# Patient Record
Sex: Male | Born: 1945 | Race: White | Hispanic: No | Marital: Married | Smoking: Former smoker
Health system: Southern US, Community
[De-identification: ages and names within clinical notes are randomized; demographics above are authoritative.]

## PROBLEM LIST (undated history)

## (undated) ENCOUNTER — Emergency Department (HOSPITAL_COMMUNITY): Admission: EM | Payer: Medicare Other | Source: Home / Self Care

## (undated) DIAGNOSIS — E7800 Pure hypercholesterolemia, unspecified: Secondary | ICD-10-CM

## (undated) DIAGNOSIS — D649 Anemia, unspecified: Secondary | ICD-10-CM

## (undated) DIAGNOSIS — N183 Chronic kidney disease, stage 3 unspecified: Secondary | ICD-10-CM

## (undated) DIAGNOSIS — G629 Polyneuropathy, unspecified: Secondary | ICD-10-CM

## (undated) DIAGNOSIS — J189 Pneumonia, unspecified organism: Secondary | ICD-10-CM

## (undated) DIAGNOSIS — E119 Type 2 diabetes mellitus without complications: Secondary | ICD-10-CM

## (undated) DIAGNOSIS — R000 Tachycardia, unspecified: Secondary | ICD-10-CM

## (undated) DIAGNOSIS — I2510 Atherosclerotic heart disease of native coronary artery without angina pectoris: Secondary | ICD-10-CM

## (undated) DIAGNOSIS — Z9861 Coronary angioplasty status: Secondary | ICD-10-CM

## (undated) DIAGNOSIS — Z951 Presence of aortocoronary bypass graft: Secondary | ICD-10-CM

## (undated) DIAGNOSIS — K219 Gastro-esophageal reflux disease without esophagitis: Secondary | ICD-10-CM

## (undated) DIAGNOSIS — I10 Essential (primary) hypertension: Secondary | ICD-10-CM

## (undated) DIAGNOSIS — K5792 Diverticulitis of intestine, part unspecified, without perforation or abscess without bleeding: Secondary | ICD-10-CM

## (undated) DIAGNOSIS — I739 Peripheral vascular disease, unspecified: Secondary | ICD-10-CM

## (undated) HISTORY — DX: Diverticulitis of intestine, part unspecified, without perforation or abscess without bleeding: K57.92

## (undated) HISTORY — DX: Pneumonia, unspecified organism: J18.9

## (undated) HISTORY — DX: Presence of aortocoronary bypass graft: Z95.1

## (undated) HISTORY — PX: FEMORAL-POPLITEAL BYPASS GRAFT: SHX937

---

## 1996-08-14 DIAGNOSIS — I2510 Atherosclerotic heart disease of native coronary artery without angina pectoris: Secondary | ICD-10-CM

## 1996-08-14 DIAGNOSIS — Z9861 Coronary angioplasty status: Secondary | ICD-10-CM

## 1996-08-14 HISTORY — DX: Atherosclerotic heart disease of native coronary artery without angina pectoris: Z98.61

## 1996-08-14 HISTORY — DX: Atherosclerotic heart disease of native coronary artery without angina pectoris: I25.10

## 1998-01-18 ENCOUNTER — Observation Stay (HOSPITAL_COMMUNITY): Admission: RE | Admit: 1998-01-18 | Discharge: 1998-01-19 | Payer: Self-pay | Admitting: Cardiovascular Disease

## 1999-04-19 ENCOUNTER — Observation Stay (HOSPITAL_COMMUNITY): Admission: RE | Admit: 1999-04-19 | Discharge: 1999-04-20 | Payer: Self-pay | Admitting: Cardiovascular Disease

## 1999-04-19 ENCOUNTER — Encounter: Payer: Self-pay | Admitting: Cardiovascular Disease

## 2001-04-24 ENCOUNTER — Encounter: Payer: Self-pay | Admitting: Internal Medicine

## 2001-04-24 ENCOUNTER — Ambulatory Visit (HOSPITAL_COMMUNITY): Admission: RE | Admit: 2001-04-24 | Discharge: 2001-04-24 | Payer: Self-pay | Admitting: Internal Medicine

## 2001-08-14 HISTORY — PX: CARDIAC CATHETERIZATION: SHX172

## 2001-09-06 ENCOUNTER — Emergency Department (HOSPITAL_COMMUNITY): Admission: EM | Admit: 2001-09-06 | Discharge: 2001-09-06 | Payer: Self-pay | Admitting: Emergency Medicine

## 2001-09-06 ENCOUNTER — Encounter: Payer: Self-pay | Admitting: Emergency Medicine

## 2004-05-05 ENCOUNTER — Ambulatory Visit (HOSPITAL_COMMUNITY): Admission: RE | Admit: 2004-05-05 | Discharge: 2004-05-05 | Payer: Self-pay | Admitting: Cardiovascular Disease

## 2005-02-02 ENCOUNTER — Emergency Department (HOSPITAL_COMMUNITY): Admission: EM | Admit: 2005-02-02 | Discharge: 2005-02-03 | Payer: Self-pay | Admitting: Emergency Medicine

## 2006-02-10 ENCOUNTER — Emergency Department (HOSPITAL_COMMUNITY): Admission: EM | Admit: 2006-02-10 | Discharge: 2006-02-10 | Payer: Self-pay | Admitting: Emergency Medicine

## 2006-02-24 ENCOUNTER — Inpatient Hospital Stay (HOSPITAL_COMMUNITY): Admission: EM | Admit: 2006-02-24 | Discharge: 2006-02-28 | Payer: Self-pay | Admitting: Emergency Medicine

## 2006-03-14 ENCOUNTER — Encounter: Admission: RE | Admit: 2006-03-14 | Discharge: 2006-03-14 | Payer: Self-pay | Admitting: Occupational Medicine

## 2007-05-31 ENCOUNTER — Encounter: Admission: RE | Admit: 2007-05-31 | Discharge: 2007-05-31 | Payer: Self-pay | Admitting: Cardiovascular Disease

## 2007-06-03 ENCOUNTER — Observation Stay (HOSPITAL_COMMUNITY): Admission: RE | Admit: 2007-06-03 | Discharge: 2007-06-04 | Payer: Self-pay | Admitting: Cardiovascular Disease

## 2007-06-05 ENCOUNTER — Ambulatory Visit: Payer: Self-pay | Admitting: *Deleted

## 2007-06-13 ENCOUNTER — Ambulatory Visit: Payer: Self-pay | Admitting: *Deleted

## 2007-06-26 ENCOUNTER — Inpatient Hospital Stay (HOSPITAL_COMMUNITY): Admission: RE | Admit: 2007-06-26 | Discharge: 2007-06-29 | Payer: Self-pay | Admitting: *Deleted

## 2007-06-27 ENCOUNTER — Encounter (INDEPENDENT_AMBULATORY_CARE_PROVIDER_SITE_OTHER): Payer: Self-pay | Admitting: *Deleted

## 2007-06-27 ENCOUNTER — Ambulatory Visit: Payer: Self-pay | Admitting: Vascular Surgery

## 2007-07-18 ENCOUNTER — Ambulatory Visit: Payer: Self-pay | Admitting: *Deleted

## 2010-01-03 ENCOUNTER — Observation Stay (HOSPITAL_COMMUNITY): Admission: EM | Admit: 2010-01-03 | Discharge: 2010-01-04 | Payer: Self-pay | Admitting: Emergency Medicine

## 2010-01-03 ENCOUNTER — Ambulatory Visit: Payer: Self-pay | Admitting: Internal Medicine

## 2010-01-04 ENCOUNTER — Ambulatory Visit: Payer: Self-pay | Admitting: Surgery

## 2010-01-04 ENCOUNTER — Encounter: Payer: Self-pay | Admitting: Internal Medicine

## 2010-09-04 ENCOUNTER — Encounter: Payer: Self-pay | Admitting: Family Medicine

## 2010-10-31 LAB — BASIC METABOLIC PANEL
BUN: 17 mg/dL (ref 6–23)
CO2: 22 mEq/L (ref 19–32)
CO2: 24 mEq/L (ref 19–32)
Calcium: 8.3 mg/dL — ABNORMAL LOW (ref 8.4–10.5)
Creatinine, Ser: 0.98 mg/dL (ref 0.4–1.5)
GFR calc Af Amer: >60 mL/min (ref >60)
GFR calc non Af Amer: >60 mL/min (ref >60)
Potassium: 3.9 mEq/L (ref 3.5–5.1)
Potassium: 4 mEq/L (ref 3.5–5.1)
Sodium: 135 mEq/L (ref 135–145)
Sodium: 135 mEq/L (ref 135–145)

## 2010-10-31 LAB — URINE MICROSCOPIC-ADD ON

## 2010-10-31 LAB — CBC
HCT: 43.5 % (ref 39.0–52.0)
Hemoglobin: 13.6 g/dL (ref 13.0–17.0)
Hemoglobin: 15 g/dL (ref 13.0–17.0)
MCHC: 34.3 g/dL (ref 30.0–36.0)
MCV: 91.6 fL (ref 78.0–100.0)
MCV: 92.5 fL (ref 78.0–100.0)
Platelets: 295 10*3/uL (ref 150–400)
RBC: 4.29 MIL/uL (ref 4.22–5.81)
WBC: 18.9 10*3/uL — ABNORMAL HIGH (ref 4.0–10.5)
WBC: 8.8 10*3/uL (ref 4.0–10.5)

## 2010-10-31 LAB — CK TOTAL AND CKMB (NOT AT ARMC)
CK, MB: 1.2 ng/mL (ref 0.3–4.0)
Relative Index: INVALID (ref 0.0–2.5)
Total CK: 92 U/L (ref 7–232)
Total CK: 99 U/L (ref 7–232)

## 2010-10-31 LAB — HEPATIC FUNCTION PANEL
AST: 18 U/L (ref 0–37)
Albumin: 3.4 g/dL — ABNORMAL LOW (ref 3.5–5.2)
Bilirubin, Direct: 0.1 mg/dL (ref 0.0–0.3)

## 2010-10-31 LAB — GLUCOSE, CAPILLARY
Glucose-Capillary: 151 mg/dL — ABNORMAL HIGH (ref 70–99)
Glucose-Capillary: 172 mg/dL — ABNORMAL HIGH (ref 70–99)
Glucose-Capillary: 226 mg/dL — ABNORMAL HIGH (ref 70–99)

## 2010-10-31 LAB — TSH: TSH: 0.796 u[IU]/mL (ref 0.350–4.500)

## 2010-10-31 LAB — TROPONIN I: Troponin I: 0.01 ng/mL (ref 0.00–0.06)

## 2010-10-31 LAB — LEUKOCYTE ALKALINE PHOSPHATASE: Leukocyte Alkaline  Phos Stain: 178 — ABNORMAL HIGH (ref 22–124)

## 2010-10-31 LAB — DIFFERENTIAL
Eosinophils Relative: 0 % (ref 0–5)
Lymphocytes Relative: 2 % — ABNORMAL LOW (ref 12–46)
Lymphs Abs: 0.3 10*3/uL — ABNORMAL LOW (ref 0.7–4.0)
Monocytes Relative: 7 % (ref 3–12)

## 2010-10-31 LAB — URINALYSIS, ROUTINE W REFLEX MICROSCOPIC
Glucose, UA: >1000 mg/dL — AB
Protein, ur: NEGATIVE mg/dL
Specific Gravity, Urine: 1.02 (ref 1.005–1.030)

## 2010-10-31 LAB — D-DIMER, QUANTITATIVE: D-Dimer, Quant: 1.76 ug/mL-FEU — ABNORMAL HIGH (ref 0.00–0.48)

## 2010-10-31 LAB — LIPID PANEL
Cholesterol: 113 mg/dL (ref 0–200)
HDL: 33 mg/dL — ABNORMAL LOW (ref >39)

## 2010-10-31 LAB — POCT CARDIAC MARKERS: CKMB, poc: 1.1 ng/mL (ref 1.0–8.0)

## 2010-12-27 NOTE — Discharge Summary (Signed)
[name], [name]                  ACCOUNT NO.:  [id number]   MEDICAL RECORD NO.:  [id number]          PATIENT TYPE:  INP   LOCATION:  2006                         FACILITY:  [facility]   PHYSICIAN:  Balinda Quails, M.D.    DATE OF BIRTH:  1946/03/14   DATE OF ADMISSION:  06/26/2007  DATE OF DISCHARGE:                               DISCHARGE SUMMARY   ANTICIPATED DATE OF DISCHARGE:  Going to be June 29, 2007 or  June 30, 2007.   ADMISSION DIAGNOSIS:  Peripheral vascular disease with left lower  extremity claudication.   DISCHARGE/SECONDARY DIAGNOSES:  1. 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  2. History of hyperlipidemia.  3. Diabetes mellitus type 2.  4. Hypertension.  5. Coronary artery disease.  6. History of right superficial femoral artery percutaneous      transluminal angioplasty and stenting.  7. History of left femoral popliteal artery bypass in 1998 using Gore-      Tex.  8. History of tobacco use, quit in 2007.  9. History of pneumonia 2 years ago.   ALLERGIES:  NO KNOWN DRUG ALLERGIES.   PROCEDURE:  Right to left femoral femoral bypass graft, using 7 mm  Dacron graft.   HISTORY:  Mr. Tribby is a 65 year old male with known peripheral vascular  disease, status post left femoral popliteal artery bypass with Gore-Tex  in 1998.  The graft has since failed.  He recently presented with  worsening left lower extremity claudication.  Doppler evaluation  performed in 2006 revealed ABIs of 0.67 on the left and 0.81 on the  right.  He underwent arteriography on June 03, 2007, revealing normal  renal arteries, normal infrarenal abdominal aorta, totally occluded left  common iliac and external iliac arteries with reconstitution of the  common femoral artery and chronic occlusion of the left superficial  femoral artery with collateralization via profunda branches.  His right  lower extremity revealed a 70% proximal right external iliac artery  stenosis, which was successfully angioplastied and stented by Dr. Allyson Sabal  on June 03, 2007.  Dr. Madilyn Fireman recommended that he undergo a right to  left femoral femoral bypass grafting for relief of his claudication.   HOSPITAL COURSE:  Mr. Demore was electively admitted to [facility] on June 26, 2007. He underwent the previously mentioned  procedure.  Postoperatively, he was transferred to step down unit 3300  where he remained for the first 24 hours postop.   Postoperative day 1, he felt appropriate to transfer to the unit 2000  where it is anticipated he will remain until discharge.  Postoperative  ABIs were 0.75 on the right and 0.57 on the left.  He had a monophasic  posterior tibial Doppler signals bilaterally.  He remained  hemodynamically table, afebrile, saturating 95% on room air.  He was on  a NovoLog insulin sliding scale for his diabetes.  Of note, reportedly,  he had discontinued his diabetic medications approximately 2 years ago.  Postoperatively, he was seen  by cardiac rehabilitation to assist with  mobility.  He has been able to walk 550 feet with a steady gait without  safety device.  He has had some incisional pain, but this has overall  been controlled with warm medication.  It is anticipated if he continues  to make steady progress that he will be ready for discharge home on  postoperative day 3, June 29, 2007.  Currently, he is in stable  condition.  He is tolerating a diabetic appropriate diet.  Incisions are  healing well without signs of infection.   DISCHARGE MEDICATIONS:  1. Tylox 1 to 2 tablets p.o. q.6 hours p.r.n. pain  2. Lotrel 5/20 mg p.o. daily.  3. Plavix 75 mg daily.  4. Aspirin 81 mg p.o. daily.  5. Simvastatin 20 mg p.o. daily.   DISCHARGE INSTRUCTIONS:  He is to continue a diabetic appropriate diet  and maintain follow up with his diabetes with his primary  physician.  He  may shower and clean his incision gently with soap and water.  He should  call if he develops fever greater than 101 or redness or drainage from  his incision site or increased pain.  He is to avoid driving or heavy  lifting for the next 2 to 3 weeks.  He will see Dr. Madilyn Fireman in  approximately 3 weeks with postoperative ABIs.  Our office will contact  him regarding specific appointment date and time.      Jerold Coombe, P.A.      Balinda Quails, M.D.  Electronically Signed    AWZ/MEDQ  D:  06/28/2007  T:  06/29/2007  Job:  [id number]   cc:   Nanetta Batty, M.D.  Catalina Pizza, M.D.

## 2010-12-27 NOTE — Assessment & Plan Note (Signed)
OFFICE VISIT   [name], STELLMACH  DOB:  21-Apr-1946                                       07/18/2007  WUXLK#:[id number]   The patient underwent right-to-left femoral-femoral bypass, 06/26/2007,  [facility].  This was carried out for severe left lower  extremity claudication.  He has done well since the surgery.  He still  has some mild soreness in his suprapubic area.  No other complaints.  No  drainage from his incisions.  Claudication symptoms markedly improved.   Ankle-brachial indices 0.76 on the right, 0.57 on the left.   Incisions are healing unremarkably.  Graft is patent.  Pink and well  perfused.  BP 133/76.  Pulse 80 per minute, regular.  Respirations 16  per minute.   The patient is doing well following surgery.  Clear to return to work  August 18, 2006.  Return per protocol for P-scan in 3 months.   Balinda Quails, M.D.  Electronically Signed   PGH/MEDQ  D:  07/18/2007  T:  07/19/2007  Job:  [id number]   cc:   Nanetta Batty, M.D.

## 2010-12-27 NOTE — Assessment & Plan Note (Signed)
OFFICE VISIT   [name], [name]  DOB:  03-09-1946                                       06/13/2007  ZOXWR#:[id number]   CHIEF COMPLAINT:  Left lower extremity claudication.   HISTORY:  The patient is a 65 year old gentleman with known peripheral  vascular disease.  History of Gore-Tex left femoral popliteal bypass  carried out in 1998.  This graft has now failed.   Recently presented with worsening left lower extremity claudication.  He  has a history of right superficial femoral artery PTA and stenting.  Further risk factors including hypertension and hyperlipidemia.   Doppler evaluation performed in 2006 revealed ABIs of 0.67 on the left  and 0.81 on the right.  He has been off tobacco products since 2007.   PAST MEDICAL HISTORY:  1. Type 2 diabetes.  2. Hyperlipidemia.  3. Hypertension.  4. Coronary artery disease.  5. Peripheral vascular disease.   MEDICATIONS:  1. Lotrel 5/20 daily.  2. Plavix 75 mg daily.  3. Aspirin 81 mg daily.  4. Simvastatin 20 mg daily.   FAMILY HISTORY:  Mother is living age 62, she has history of coronary  artery disease with recent open heart bypass surgery.  She has also a  history of diabetes.  His father died in his 32's as a result of trauma.  He has a brother living age 16 with a history of coronary artery disease  also status post open heart bypass surgery.   SOCIAL HISTORY:  The patient is widowed.  He has 2 children.  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  He does not smoke.  He discontinued tobacco  use February 24, 2006.  No alcohol use.   ALLERGIES:  None known.   REVIEW OF SYSTEMS:  Denies weight loss, fever or chills.  Cardiac,  pulmonary, GI and GU symptoms all negative.  Pain in his legs with  walking.  No neurologic, orthopedic, psychiatric, visual or hearing  problems.  Denies bleeding disorder.   PHYSICAL EXAMINATION:  A generally well-appearing 65 year old gentleman.  Alert and oriented.  No acute  distress.  Vital signs:  BP 136/79, pulse  69 per minute, respirations 18 per minute.  HEENT:  Mouth and throat are  clear.  Normocephalic.  Extraocular movements intact.  Neck:  No JVD.  Normal thyroid.  No lymph nodes.  Cardiovascular:  No carotid bruits.  Normal heart sounds without murmurs.  No gallops or rubs.  Regular  rhythm and rate.  Chest:  Equal air entry bilaterally without rales or  rhonchi.  Normal to percussion.  Abdomen:  Soft.  Nontender.  No masses  or organomegaly.  Normal bowel sounds.  Lower extremities:  2+ right  femoral pulse.  Absent left femoral pulse.  No popliteal, posterior  tibia or dorsalis pedis pulses bilaterally.  No ankle edema.  Neurological:  Cranial nerves intact.  Strength equal bilaterally.  Reflexes 1+.  Normal gait.  Sensation intact.   INVESTIGATIONS:  Arteriography performed 06/03/2007 revealed normal  renal arteries, normal infrarenal abdominal aorta.  Totally occluded  left common iliac and external iliac arteries.  Reconstitution common  femoral artery and chronic occlusion left superficial femoral artery  with collateralization via profunda branches.  Right lower extremity  revealed a 70% proximal right external iliac artery stenosis.  This was  successfully angioplastied and stented  by Dr. Allyson Sabal 06/03/2007.   IMPRESSION:  1. Left lower extremity limiting claudication secondary to left iliac      occlusion.  2. Coronary artery disease.  3. Hypertension.  4. Type 2 diabetes.  5. Hyperlipidemia.   RECOMMENDATIONS:  The patient will undergo right to left femoral femoral  bypass for relief of left lower extremity claudication.  Potential risks  to the operative procedure 3-5% of a major complication including MI,  stroke, graft failure, major infection or bleeding.  Diabetes and  coronary artery disease currently well controlled.  Hypertension well  controlled.  The patient will discontinue Plavix 1 week prior to  surgery.  Planned  surgery at [facility].   Balinda Quails, M.D.  Electronically Signed   PGH/MEDQ  D:  06/13/2007  T:  06/14/2007  Job:  [id number]

## 2010-12-27 NOTE — Op Note (Signed)
NAMEALOYSIOUS, VANGIESON                  ACCOUNT NO.:  [id number]   MEDICAL RECORD NO.:  [id number]          PATIENT TYPE:  INP   LOCATION:  2006                         FACILITY:  [facility]   PHYSICIAN:  Balinda Quails, M.D.    DATE OF BIRTH:  1946/04/15   DATE OF PROCEDURE:  06/26/2007  DATE OF DISCHARGE:                               OPERATIVE REPORT   SURGEON:  Denman George, M.D.   ASSISTANT:  Di Kindle. Edilia Bo, M.D., and Jerold Coombe, P.A.   ANESTHETIC:  General endotracheal.   PREOPERATIVE DIAGNOSIS:  Left lower extremity claudication.   PROCEDURE:  Right-to-left femoral-femoral bypass.   CLINICAL NOTE:  Mr. Isidor Bromell is a 65 year old gentleman with history  of tobacco use.  Known peripheral vascular disease.  Previous left  femoral-popliteal bypass which is now chronically occluded.  He  presented with worsening claudication in his left lower extremity.  Underwent arteriography by Dr. Allyson Sabal revealing aortoiliac occlusive  disease.  He has undergone stenting in the right common iliac artery.  Total occlusion of left common external iliac artery is present.  Scheduled at this time for right-to-left femoral-femoral bypass with  left profundoplasty.  Potential risks of the operative procedure  explained to the patient in detail with a major morbidity mortality of 1-  2%.  This includes such complications as MI, stroke, graft infection,  limb ischemia, bleeding or other complications.   OPERATIVE PROCEDURE:  The patient was brought to the operating room in  stable condition.  Placed in a supine position.  General endotracheal  anesthesia induced.  The abdomen of both legs prepped and draped in a  sterile fashion.   Matching longitudinal skin incisions were made at the inguinal ligament  bilaterally.  Dissection carried down through subcutaneous tissue and  lymphatics with electrocautery.   Dissection of the right groin revealed the common femoral artery be  widely  patent.  There was moderate posterior plaque.  Excellent pulse.  Common femoral artery exposed and encircled proximally with a vessel  loop.  Distal dissection carried down to the origin of the superficial  femoral and profunda femoris arteries and the distal common femoral,  right common femoral artery, and encircled with a vessel loop.   The left groin was a redo dissection.  The patient had an occluded left  femoral popliteal Gore-Tex bypass graft.  This arose from left common  femoral artery.  Left common femoral artery was mobilized up to the  inguinal ligament and encircled with vessel loop.  The graft was  dissected free and mobilized.  The origin of the occluded left  superficial femoral artery was also dissected out and encircled with  vessel loop.  The profunda femoris was the only runoff vessel.  This was  a moderate-sized vessel.  Moderate posterior plaque.  The vessel was  dissected out to the first major branchings which were encircled with  vessel loops.   A suprapubic subcutaneous tunnel created between the 2 incisions.  A 7-  mm Dacron graft placed through the tunnel.  The patient administered  5000 units of  heparin intravenously.  The femoral vessels controlled  with clamps on the right.  The right common femoral artery opened  longitudinally.  The right side of the graft beveled and anastomosed end-  to-side to the right common femoral artery using running 7-0 Prolene  suture.  The graft then flushed and then controlled proximally with a  fistula clamp.  The right femoral vessel was then released.   On the left side, the occluded femoral popliteal graft was taken down.  The arteriotomy in the common femoral artery was then extended down into  the left profunda femoris artery.  The left side of the graft was  beveled and anastomosed end-to-side to the left common femoral artery  with the toe of the graft extending out onto the left profunda femoris  artery as a  profundoplasty.  This was an end-to-side anastomosis with a  running 5-0 Prolene suture.  At completion of left femoral anastomosis,  all vessels were flushed.  The graft was flushed.  Clamps were then  removed and excellent flow present.   We irrigated with saline solution.  Adequate hemostasis obtained.  The  patient was administered 50 mg of protamine intravenously.   The groin incisions were then closed with running 2-0 Vicryl suture in a  deep subcutaneous layer.  Running 3-0 Vicryl sutures in superficial  subcutaneous layer.  Four-0 Monocryl in the skin.  Dermabond applied.  No apparent complications.  The patient transferred to the recovery room  in stable condition.      Balinda Quails, M.D.  Electronically Signed     PGH/MEDQ  D:  06/26/2007  T:  06/27/2007  Job:  [id number]   cc:   Nanetta Batty, M.D.  Catalina Pizza, M.D.

## 2010-12-27 NOTE — Cardiovascular Report (Signed)
[name], [name]                  ACCOUNT NO.:  [id number]   MEDICAL RECORD NO.:  [id number]          PATIENT TYPE:  OBV   LOCATION:  2807                         FACILITY:  [facility]   PHYSICIAN:  Nanetta Batty, M.D.   DATE OF BIRTH:  1945-08-29   DATE OF PROCEDURE:  06/03/2007  DATE OF DISCHARGE:                            CARDIAC CATHETERIZATION   Mr. Burch is a 65 year old recently widowed white male, father two  children, with history of CAD and PVD.  He had circumflex stenting in  1998.  He had right superficial femoral artery PTA and stenting as well  as left femoral-popliteal bypass grafting by Dr. Liliane Bade. His other  problems include hypertension and hyperlipidemia.  His last functional  study in 2005 was nonischemic.  Dopplers performed in 2006 revealed a  patent femoral-popliteal bypass graft with ABI of 0.67 on the left and  0.81 on the right.  He denied chest pain, shortness of breath.  He  stopped taking his medicines over a year ago because of financial  considerations.  Unfortunately, he has recently lost his wife this past  summer from myocardial infarction.  He stopped smoking July of last  year.  One week ago he noticed increasing left lower extremity  claudication.  Dopplers performed in the office the same day revealed a  left ABI of 0.46 with an occluded left iliac, left superficial femoral  artery, and femoral-popliteal bypass graft.  He presents now for  angiography and potential intervention.   DESCRIPTION OF PROCEDURE:  The patient was brought to the second floor  [place] PV angiographic suite in postabsorptive state.  Ge was  premedicated with p.o. Valium.  His left groin was prepped and shaved in  usual sterile fashion; 1% Xylocaine was used for local anesthesia.  A 5-  French sheath was inserted into the right femoral artery using standard  Seldinger technique.  A 5-French tennis racket catheter was used for  midstream distal abdominal aortography with  bifemoral runoff.  Visipaque  dye was used for the entirety of the case.  Aortic pressures monitored  in the case.   ANGIOGRAPHIC RESULTS:  1. Abdominal aorta.      a.     Renal arteries:  30% proximal right renal artery stenosis.      b.     Infrarenal abdominal aorta, normal.  2. Left lower extremity:      a.     Totally occluded left common iliac proximally.      b.     Totally occluded left external iliac which reconstitutes at       the common femoral level with a patent profunda femoris.      c.     Occluded left superficial femoral artery which reconstitutes       in the popliteal.      d.     Occluded femoral-popliteal bypass graft.      e.     One-vessel runoff via the posterior tibial.  3. Right lower extremity:      a.     A 70%  proximal right external iliac artery stenosis.  There       was a 50 mm gradient noted on pullback after administration of 200       mcg of intra-arterial nitroglycerin via the side-arm sheath.      b.     Patent proximal and mid right superficial femoral artery       stents.      c.     A 70% distal superficial femoral artery, proximal popliteal,       with one-vessel runoff to posterior tibial.   IMPRESSION:  Mr. Frisbee has an occluded left system with a patent common  femoral profunda as well as one-vessel runoff.  He does have a high-  grade lesion in his right external iliac artery which is hemodynamically  stable.  I believe he will require femoral-femoral crossover grafting.  I do not think he is a candidate for endovascular therapy given the fact  that reconstitution occurs just above the femoral head.   PLAN:  Plan on performing a PTA and stenting of his right external iliac  artery to provide inflow prior to an elective femoral-femoral crossover  graft.   Existing 5-French sheath was exchanged over wire for a 7-French break-  tipped long sheath.  The patient received 3000 units of heparin.  PTA  was performed with a 5.2 Powerflex and  stenting with an 8.3 Nitinol  Smart self-expanding stent. The post dilatation was performed with a 7.2  Powerflex at 4 atmospheres resulting in reduction of 70% stenosis to 0%  residual.  The patient tolerated the procedure well.  The long sheath  was exchanged for a short 7-French sheath.  The patient left the lab in  stable condition.  Sheath will be removed once the ACT falls below 200.  The patient will be gently hydrated, discharged home in the morning.  He  will need an outpatient Persantine Myoview to risk stratify him prior to  his femoral-femoral crossover grafting.  I have contacted Dr. Liliane Bade  to evaluate him while he is in the hospital prior to discharge and  arrange for elective surgery.  The patient left lab in stable condition.      Nanetta Batty, M.D.  Electronically Signed     JB/MEDQ  D:  06/03/2007  T:  06/03/2007  Job:  [id number]   cc:   2nd [place] [facility] Angiographic Suite  [facility]

## 2010-12-27 NOTE — Discharge Summary (Signed)
[name], [name]                  ACCOUNT NO.:  [id number]   MEDICAL RECORD NO.:  [id number]          PATIENT TYPE:  OBV   LOCATION:  4705                         FACILITY:  [facility]   PHYSICIAN:  Nanetta Batty, M.D.   DATE OF BIRTH:  03/26/1946   DATE OF ADMISSION:  06/03/2007  DATE OF DISCHARGE:  06/04/2007                               DISCHARGE SUMMARY   DISCHARGE DIAGNOSES:  1. Peripheral vascular disease, right external iliac artery      percutaneous transluminal angioplasty this admission.  2. Residual total left iliac superficial femoral artery and femoral      popliteal bypass grafting, seen by Dr. Liliane Bade this admission      with plans for readmission in the future for right-to-left femoral-      femoral bypass grafting.   HOSPITAL COURSE:  The patient is a 65 year old male followed by Dr.  Allyson Sabal, Dr. Madilyn Fireman, with coronary disease and peripheral vascular disease.  He has had a previous fem-pop bypass grafting.  He had recently stopped  his medications because of financial problems, and then this past summer  he lost his wife to a myocardial infarction.  He presented May 31, 2007, with increasing claudication.  Dr. Allyson Sabal felt he had recently  occluded his fem-pop bypass graft.  He was admitted June 03, 2007 for  PV intervention.  This was done by Dr. Allyson Sabal.  He had a total left SFA,  total left fem bypass graft and total left common and external iliac  artery with one-vessel runoff.  On the right had a 70% proximal external  iliac artery, patent stents in the mid SFA and a 70% popliteal artery.  He underwent PTA and stenting of the right external iliac artery.  Dr.  Allyson Sabal feels he will need right-to-left bypass grafting.  Dr. Madilyn Fireman was  asked to see the patient in consult.  He will also need a followup  Myoview prior to his surgery.  He was discharged June 04, 2007.   DISCHARGE MEDICATIONS:  1. Lotrel 5/20 once a day.  2. Aspirin 81 mg a day.  3. Plavix 75 mg  a day.   LABORATORY DATA:  Sodium 133, potassium 4.1, BUN 15, creatinine 0.8,  white count 11.4, hemoglobin 15.9, hematocrit 43.9, platelets 295.   DISPOSITION:  The patient is discharged in stable condition.  He will  follow up with Dr. Madilyn Fireman and Dr. Allyson Sabal.      Abelino Derrick, P.A.      Nanetta Batty, M.D.  Electronically Signed    LKK/MEDQ  D:  07/16/2007  T:  07/16/2007  Job:  [id number]

## 2010-12-30 NOTE — Discharge Summary (Signed)
NAMEGARRY, [name]                  ACCOUNT NO.:  [id number]   MEDICAL RECORD NO.:  [id number]          PATIENT TYPE:  INP   LOCATION:  A204                          FACILITY:  [facility]   PHYSICIAN:  Corrie Mckusick, M.D.  DATE OF BIRTH:  February 03, 1946   DATE OF ADMISSION:  02/24/2006  DATE OF DISCHARGE:  07/18/2007LH                                 DISCHARGE SUMMARY   DISCHARGE DIAGNOSIS:  Pneumonia.   HISTORY OF PRESENT ILLNESS/PAST MEDICAL HISTORY:  Please see admission H &  P.   HOSPITAL COURSE:  A 65 year old gentleman with diabetes, coronary artery  disease, peripheral vascular disease, and bilateral pneumonias.  He was  admitted for intravenous antibiotics and moderate sliding scale insulin.  We  also worked on better blood sugar control when he was here.  The day after  admission, he felt remarkably better.  White count had come down to 16.  Antibiotics and nebulizer treatments were continued.  CT of the chest was  done two days after admission and showed a questionable reactivity and  needed repeated in four weeks, and this will be set up prior to his  discharge.  The patient continued to do well and was ready for discharge on  February 28, 2006.  He was to follow up with Dr. Elfredia Nevins in one week on  March 07, 2006, at 11:45, and a CT of the chest will be done April 04, 2006,  at 9 a.m.   DISCHARGE MEDICATIONS:  Discharge medications are the same as admission in  addition to Xopenex MDI four times a day, Spiriva daily, and Avalox daily  for 7 additional days.   DISCHARGE CONDITION:  Improved and stable.   DISCHARGE RECOMMENDATIONS:  Discharge recommendations were to strongly  discontinue smoking.      Corrie Mckusick, M.D.  Electronically Signed     JCG/MEDQ  D:  03/12/2006  T:  03/12/2006  Job:  [id number]

## 2010-12-30 NOTE — H&P (Signed)
NAMEBECKY, BERBERIAN                  ACCOUNT NO.:  [id number]   MEDICAL RECORD NO.:  [id number]          PATIENT TYPE:  INP   LOCATION:  A204                          FACILITY:  [facility]   PHYSICIAN:  Corrie Mckusick, M.D.  DATE OF BIRTH:  1945/11/04   DATE OF ADMISSION:  02/24/2006  DATE OF DISCHARGE:  LH                                HISTORY & PHYSICAL   ADMISSION DIAGNOSIS:  Pneumonia.   ADDITIONAL DIAGNOSIS:  Diabetes.   HISTORY OF PRESENT ILLNESS:  This is a 65 year old gentleman with a history  of diabetes and coronary artery disease who presents with bilateral  pneumonias.  He had an injury at work a few weeks ago with rib fractures and  feels like he subsequently developed this pneumonia from those fractures.  He came into the emergency department at the end of June and that is when  the rib fractures were found.  He now presents with four to five days of  worsening cough, fever, and congestion.  He also has some shortness of  breath.  He has not been in the office for his diabetes or had followup in  nearly two years.  He reports no chest pain, shortness of breath, or other  complaints.  He also is followed by [facility].   PAST MEDICAL HISTORY:  1.  Diabetes.  2.  Coronary artery disease, status post stents and fem/pop bypass of the      left leg.  3.  Peripheral vascular disease.   PAST SURGICAL HISTORY:  As above.   SOCIAL HISTORY:  He continues to smoke.  No alcohol use.  No illicit drugs.   FAMILY HISTORY:  Heart disease in his family and diabetes.  Lives with his  wife.   ADMISSION MEDICATIONS:  1.  Glucotrol 5 mg b.i.d.  2.  Aspirin 81 mg daily.   ALLERGIES:  No known drug allergies.   PHYSICAL EXAMINATION:  VITAL SIGNS:  Temperature 98.4, blood pressure  108/59, heart rate 62, respiratory rate 18, O2 saturation 98% on room air.  GENERAL:  When I saw the patient, he was pleasant, talkative, in no acute  distress.  HEENT:  Nasopharynx clear with  moist mucous membranes.  NECK:  Supple.  CHEST:  There is diffuse in-expiratory wheezes with some mild bilateral  upper rhonchi.  No areas of consolidation appreciated.  CARDIOVASCULAR:  Regular rate and rhythm.  Normal S1 and S2.  ABDOMEN:  Soft, nontender.  EXTREMITIES:  No cyanosis, clubbing, or edema.   LABORATORY DATA:  White count 16, hemoglobin of 15.5, hematocrit of 44.7,  platelets of 264, left shift.  Sodium 131, potassium 3.5, chloride 97,  bicarbonate 26, glucose 179, BUN 17, creatinine 0.9.  Chest x-ray showed  right middle, right upper, and lingular infiltrates.   ASSESSMENT AND PLAN:  A 65 year old gentleman with diabetes, coronary artery  disease, peripheral vascular disease, with bilateral pneumonias.   PLAN:  1.  Admit for aggressive antibiotics, including double coverage with      Rocephin 1 g IV q.24h., and azithromycin 500 mg IV q.24h.  2.  Moderate sliding scale insulin.  3.  Will need better diabetes control and followup.  4.  Add on hemoglobin A1C.  5.  Xopenex plus Atrovent nebulizers q.i.d.  6.  Work on smoking cessation.   We will continue to follow closely.      Corrie Mckusick, M.D.  Electronically Signed     JCG/MEDQ  D:  02/25/2006  T:  02/25/2006  Job:  [id number]

## 2011-05-23 LAB — COMPREHENSIVE METABOLIC PANEL
AST: 21
Albumin: 3.7
BUN: 14
Calcium: 9.3
Chloride: 98
Creatinine, Ser: 1.11
GFR calc Af Amer: >60
Total Bilirubin: 0.6

## 2011-05-23 LAB — BASIC METABOLIC PANEL
CO2: 26
Chloride: 100
GFR calc Af Amer: >60
Glucose, Bld: 227 — ABNORMAL HIGH
Sodium: 133 — ABNORMAL LOW

## 2011-05-23 LAB — CBC
HCT: 39.9
HCT: 44.8
Hemoglobin: 13.5
MCHC: 33.9
MCHC: 34.2
MCV: 90.2
MCV: 90.9
Platelets: 297
RBC: 4.39
RDW: 13.4
WBC: 8.9

## 2011-05-23 LAB — PROTIME-INR: INR: 1

## 2011-05-23 LAB — URINALYSIS, ROUTINE W REFLEX MICROSCOPIC
Bilirubin Urine: NEGATIVE
Ketones, ur: NEGATIVE
Leukocytes, UA: NEGATIVE
Nitrite: NEGATIVE
Specific Gravity, Urine: 1.03
Urobilinogen, UA: 1

## 2011-05-23 LAB — APTT: aPTT: 26

## 2011-05-23 LAB — TYPE AND SCREEN: ABO/RH(D): A POS

## 2011-05-24 LAB — BASIC METABOLIC PANEL
BUN: 15
CO2: 27
Chloride: 100
Glucose, Bld: 143 — ABNORMAL HIGH
Potassium: 4.1

## 2011-05-24 LAB — CBC
Hemoglobin: 14.9
MCHC: 34.1
RDW: 13.7

## 2013-04-04 ENCOUNTER — Other Ambulatory Visit: Payer: Self-pay | Admitting: *Deleted

## 2013-04-04 DIAGNOSIS — I2510 Atherosclerotic heart disease of native coronary artery without angina pectoris: Secondary | ICD-10-CM

## 2013-04-04 DIAGNOSIS — I10 Essential (primary) hypertension: Secondary | ICD-10-CM

## 2013-04-17 ENCOUNTER — Telehealth (HOSPITAL_COMMUNITY): Payer: Self-pay | Admitting: Cardiovascular Disease

## 2013-04-24 ENCOUNTER — Telehealth (HOSPITAL_COMMUNITY): Payer: Self-pay | Admitting: *Deleted

## 2013-08-18 ENCOUNTER — Telehealth (HOSPITAL_COMMUNITY): Payer: Self-pay | Admitting: *Deleted

## 2013-09-03 ENCOUNTER — Telehealth: Payer: Self-pay

## 2013-09-03 ENCOUNTER — Other Ambulatory Visit: Payer: Self-pay

## 2013-09-03 DIAGNOSIS — Z1211 Encounter for screening for malignant neoplasm of colon: Secondary | ICD-10-CM

## 2013-09-03 NOTE — Telephone Encounter (Signed)
Pt was referred from [facility] in [place]. I called and lmom for a return call.

## 2013-09-03 NOTE — Telephone Encounter (Signed)
Gastroenterology Pre-Procedure Review  Request Date: 09/03/2013 Requesting Physician: [facility] in [place]   Referred pt for his first ever screening colonoscopy  PATIENT REVIEW QUESTIONS: The patient responded to the following health history questions as indicated:    1. Diabetes Melitis: YES 2. Joint replacements in the past 12 months: no 3. Major health problems in the past 3 months: no 4. Has an artificial valve or MVP: no 5. Has a defibrillator: no 6. Has been advised in past to take antibiotics in advance of a procedure like teeth cleaning: no    MEDICATIONS & ALLERGIES:    Patient reports the following regarding taking any blood thinners:   Plavix? YES Aspirin? YES Coumadin? no  Patient confirms/reports the following medications:  Current Outpatient Prescriptions  Medication Sig Dispense Refill  . amLODipine (NORVASC) 10 MG tablet Take 10 mg by mouth daily.      [name] aspirin 81 MG tablet Take 81 mg by mouth daily.      . cetirizine (ZYRTEC) 10 MG tablet Take 10 mg by mouth daily.      . clopidogrel (PLAVIX) 75 MG tablet Take 75 mg by mouth daily with breakfast.      . gabapentin (NEURONTIN) 300 MG capsule Take 300 mg by mouth daily.      [name] glipiZIDE (GLUCOTROL) 5 MG tablet Take 5 mg by mouth 2 (two) times daily before a meal. Take two tablets twice daily      . hydrochlorothiazide (HYDRODIURIL) 25 MG tablet Take 25 mg by mouth daily.      [name] lisinopril (PRINIVIL,ZESTRIL) 40 MG tablet Take 40 mg by mouth daily.      . metFORMIN (GLUMETZA) 500 MG (MOD) 24 hr tablet Take 500 mg by mouth 2 (two) times daily with a meal. PT takes two tablets twice daily      . pantoprazole (PROTONIX) 40 MG tablet Take 40 mg by mouth daily.      . simvastatin (ZOCOR) 40 MG tablet Take 40 mg by mouth daily. PT takes 1/2 tablet at bedtime daily       No current facility-administered medications for this visit.    Patient confirms/reports the following allergies:  Not on File  No orders of the  defined types were placed in this encounter.    AUTHORIZATION INFORMATION Primary Insurance:  ID #:   Group #:  Pre-Cert / Auth required: Pre-Cert / Auth #:  Secondary Insurance:   ID #:   Group #:  Pre-Cert / Auth required: Pre-Cert / Auth #:   SCHEDULE INFORMATION: Procedure has been scheduled as follows:  Date: 09/22/2013                   Time:  9:30 AM Location: [facility] Short Stay  This Gastroenterology Pre-Precedure Review Form is being routed to the following provider(s): R. Garfield Cornea, MD

## 2013-09-03 NOTE — Telephone Encounter (Signed)
Appropriate for colonoscopy. No diabetes medication the day of the procedure.

## 2013-09-04 MED ORDER — PEG-KCL-NACL-NASULF-NA ASC-C 100 G PO SOLR: 1.0000 | ORAL | Status: DC

## 2013-09-04 NOTE — Telephone Encounter (Signed)
Rx faxed to [facility] at [phone] and instructions mailed to pt.

## 2013-09-09 ENCOUNTER — Encounter (HOSPITAL_COMMUNITY): Payer: Self-pay | Admitting: Pharmacy Technician

## 2013-09-15 ENCOUNTER — Telehealth: Payer: Self-pay | Admitting: Internal Medicine

## 2013-09-15 NOTE — Telephone Encounter (Signed)
Someone called from the [facility] regarding a prescription on patient. He said this was someone that they have never seen. I sent this to [facility], but it looks like it was for a prep where you had scheduled a colonoscopy. Please advise where this prescription needs to go. The pharmacy that called their number is [phone]

## 2013-09-15 NOTE — Telephone Encounter (Signed)
Someone called from the [facility] to let us know that a prescription had been sent to them and they do not have this patient listed as someone that they see. Please advise. I told him that [facility]'s nurse was not here today and I would get her to follow up on where the prescription needed to be. His number is [phone]

## 2013-09-15 NOTE — Telephone Encounter (Signed)
I returned the call. Was told this was a Customer service manager for the [facility]. I told them I thought the problem was that pt was referred as [name] and we have him as [name].  I was told that the pt would need to get in touch with his PCP at the [facility].  I called pt and he said he had talked to them last Friday and was assured that the prep would be here in time to do for his colonoscopy on 09/22/2013.  He will call me by Thursday afternoon if he has not received it, so I can send something in locally.

## 2013-09-16 NOTE — Telephone Encounter (Signed)
DS took care of this, see separate phone note.

## 2013-09-18 MED ORDER — PEG 3350-KCL-NA BICARB-NACL 420 G PO SOLR: 4000.0000 mL | ORAL | Status: DC

## 2013-09-18 NOTE — Telephone Encounter (Signed)
Pt called and said he has not received a prep from the [facility] and asked me to call him in less expensive one to [place]. I am faxing the instructions to [facility] also.

## 2013-09-18 NOTE — Addendum Note (Signed)
Addended by: Everardo All on: 09/18/2013 02:02 PM   Modules accepted: Orders

## 2013-09-22 ENCOUNTER — Encounter (HOSPITAL_COMMUNITY): Payer: Self-pay | Admitting: *Deleted

## 2013-09-22 ENCOUNTER — Ambulatory Visit (HOSPITAL_COMMUNITY)
Admission: RE | Admit: 2013-09-22 | Discharge: 2013-09-22 | Disposition: A | Discharge status: Home / Self Care | Payer: Non-veteran care | Source: Ambulatory Visit | Attending: Internal Medicine | Admitting: Internal Medicine

## 2013-09-22 ENCOUNTER — Encounter (HOSPITAL_COMMUNITY)
Admission: RE | Disposition: A | Discharge status: Home / Self Care | Payer: Self-pay | Source: Ambulatory Visit | Attending: Internal Medicine

## 2013-09-22 DIAGNOSIS — Z1211 Encounter for screening for malignant neoplasm of colon: Secondary | ICD-10-CM

## 2013-09-22 DIAGNOSIS — E119 Type 2 diabetes mellitus without complications: Secondary | ICD-10-CM | POA: Insufficient documentation

## 2013-09-22 DIAGNOSIS — K5730 Diverticulosis of large intestine without perforation or abscess without bleeding: Secondary | ICD-10-CM | POA: Insufficient documentation

## 2013-09-22 DIAGNOSIS — I10 Essential (primary) hypertension: Secondary | ICD-10-CM | POA: Insufficient documentation

## 2013-09-22 DIAGNOSIS — Z7982 Long term (current) use of aspirin: Secondary | ICD-10-CM | POA: Insufficient documentation

## 2013-09-22 DIAGNOSIS — Z79899 Other long term (current) drug therapy: Secondary | ICD-10-CM | POA: Insufficient documentation

## 2013-09-22 HISTORY — DX: Type 2 diabetes mellitus without complications: E11.9

## 2013-09-22 HISTORY — DX: Polyneuropathy, unspecified: G62.9

## 2013-09-22 HISTORY — DX: Pure hypercholesterolemia, unspecified: E78.00

## 2013-09-22 HISTORY — DX: Gastro-esophageal reflux disease without esophagitis: K21.9

## 2013-09-22 HISTORY — DX: Essential (primary) hypertension: I10

## 2013-09-22 HISTORY — DX: Peripheral vascular disease, unspecified: I73.9

## 2013-09-22 HISTORY — PX: COLONOSCOPY: SHX5424

## 2013-09-22 LAB — GLUCOSE, CAPILLARY: GLUCOSE-CAPILLARY: 109 mg/dL — AB (ref 70–99)

## 2013-09-22 SURGERY — COLONOSCOPY
Anesthesia: Moderate Sedation

## 2013-09-22 MED ORDER — MIDAZOLAM HCL 5 MG/5ML IJ SOLN
INTRAMUSCULAR | Status: DC | PRN
  Administered 2013-09-22: 2 mg via INTRAVENOUS
  Administered 2013-09-22 (×2): 1 mg via INTRAVENOUS
  Administered 2013-09-22: 2 mg via INTRAVENOUS

## 2013-09-22 MED ORDER — ONDANSETRON HCL 4 MG/2ML IJ SOLN
INTRAMUSCULAR | Status: DC | PRN
  Administered 2013-09-22: 4 mg via INTRAVENOUS

## 2013-09-22 MED ORDER — SODIUM CHLORIDE 0.9 % IV SOLN
INTRAVENOUS | Status: DC
Start: 2013-09-22 — End: 2013-09-22
  Administered 2013-09-22: 09:00:00 via INTRAVENOUS

## 2013-09-22 MED ORDER — STERILE WATER FOR IRRIGATION IR SOLN
Status: DC | PRN
  Administered 2013-09-22: 09:00:00

## 2013-09-22 MED ORDER — MIDAZOLAM HCL 5 MG/5ML IJ SOLN
INTRAMUSCULAR | Status: AC
  Filled 2013-09-22: qty 10

## 2013-09-22 MED ORDER — ONDANSETRON HCL 4 MG/2ML IJ SOLN
INTRAMUSCULAR | Status: DC
Start: 2013-09-22 — End: 2013-09-22
  Filled 2013-09-22: qty 2

## 2013-09-22 MED ORDER — MEPERIDINE HCL 100 MG/ML IJ SOLN
INTRAMUSCULAR | Status: DC | PRN
  Administered 2013-09-22: 50 mg via INTRAVENOUS
  Administered 2013-09-22 (×2): 25 mg via INTRAVENOUS

## 2013-09-22 MED ORDER — MEPERIDINE HCL 100 MG/ML IJ SOLN
INTRAMUSCULAR | Status: AC
  Filled 2013-09-22: qty 2

## 2013-09-22 NOTE — H&P (Signed)
Primary Care Physician:  No primary provider on file. Primary Gastroenterologist:  Dr. Gala Romney  Pre-Procedure History & Physical: HPI:  [name] is a 68 y.o. male is here for a screening colonoscopy. No bowel symptoms. No prior history of colonoscopy. No family history of polyps or colon cancer  Past Medical History  Diagnosis Date  . Hypertension   . Hypercholesteremia   . GERD (gastroesophageal reflux disease)   . Diabetes mellitus without complication   . Neuropathy   . Peripheral vascular disease     Past Surgical History  Procedure Laterality Date  . Cardiac catheterization  2003    with stent  . Femoral-popliteal bypass graft      Prior to Admission medications   Medication Sig Start Date End Date Taking? Authorizing Provider  amLODipine (NORVASC) 10 MG tablet Take 10 mg by mouth daily.   Yes Historical Provider, MD  aspirin 81 MG tablet Take 81 mg by mouth daily.   Yes Historical Provider, MD  cetirizine (ZYRTEC) 10 MG tablet Take 10 mg by mouth daily.   Yes Historical Provider, MD  Cholecalciferol (VITAMIN D) 2000 UNITS tablet Take 2,000 Units by mouth daily.   Yes Historical Provider, MD  clopidogrel (PLAVIX) 75 MG tablet Take 75 mg by mouth daily with breakfast.   Yes Historical Provider, MD  gabapentin (NEURONTIN) 300 MG capsule Take 300 mg by mouth daily.   Yes Historical Provider, MD  glipiZIDE (GLUCOTROL) 5 MG tablet Take 10 mg by mouth 2 (two) times daily before a meal.    Yes Historical Provider, MD  hydrochlorothiazide (HYDRODIURIL) 25 MG tablet Take 25 mg by mouth daily.   Yes Historical Provider, MD  lisinopril (PRINIVIL,ZESTRIL) 40 MG tablet Take 40 mg by mouth daily.   Yes Historical Provider, MD  metFORMIN (GLUMETZA) 500 MG (MOD) 24 hr tablet Take 1,000 mg by mouth 2 (two) times daily with a meal.    Yes Historical Provider, MD  pantoprazole (PROTONIX) 40 MG tablet Take 40 mg by mouth daily.   Yes Historical Provider, MD  polyethylene glycol-electrolytes  (TRILYTE) 420 G solution Take 4,000 mLs by mouth as directed. 09/18/13  Yes Daneil Dolin, MD  simvastatin (ZOCOR) 40 MG tablet Take 20 mg by mouth at bedtime.    Yes Historical Provider, MD  peg 3350 powder (MOVIPREP) 100 G SOLR Take 1 kit (200 g total) by mouth as directed. 09/04/13   Daneil Dolin, MD    Allergies as of 09/03/2013  . (Not on File)    Family History  Problem Relation Age of Onset  . Colon cancer Neg Hx     History   Social History  . Marital Status: Married    Spouse Name: N/A    Number of Children: N/A  . Years of Education: N/A   Occupational History  . Not on file.   Social History Main Topics  . Smoking status: Former Smoker -- 2.00 packs/day for 30 years    Types: Cigarettes  . Smokeless tobacco: Not on file  . Alcohol Use: No  . Drug Use: No  . Sexual Activity: Not on file   Other Topics Concern  . Not on file   Social History Narrative  . No narrative on file    Review of Systems: See HPI, otherwise negative ROS  Physical Exam: BP 112/54  Pulse 93  Temp(Src) 97.5 F (36.4 C) (Oral)  Resp 24  Ht '5\' 7"'  (1.702 m)  Wt 155 lb (70.308 kg)  BMI 24.27 kg/m2  SpO2 98% General:   Alert,  Well-developed, well-nourished, pleasant and cooperative in NAD Head:  Normocephalic and atraumatic. Eyes:  Sclera clear, no icterus.   Conjunctiva pink. Ears:  Normal auditory acuity. Nose:  No deformity, discharge,  or lesions. Mouth:  No deformity or lesions, dentition normal. Neck:  Supple; no masses or thyromegaly. Lungs:  Clear throughout to auscultation.   No wheezes, crackles, or rhonchi. No acute distress. Heart:  Regular rate and rhythm; no murmurs, clicks, rubs,  or gallops. Abdomen:  Soft, nontender and nondistended. No masses, hepatosplenomegaly or hernias noted. Normal bowel sounds, without guarding, and without rebound.   Msk:  Symmetrical without gross deformities. Normal posture. Pulses:  Normal pulses noted. Extremities:  Without  clubbing or edema. Neurologic:  Alert and  oriented x4;  grossly normal neurologically. Skin:  Intact without significant lesions or rashes. Cervical Nodes:  No significant cervical adenopathy. Psych:  Alert and cooperative. Normal mood and affect.  Impression/Plan: [name] is now here to undergo a screening colonoscopy.  First-ever average risk screening examination.  Risks, benefits, limitations, imponderables and alternatives regarding colonoscopy have been reviewed with the patient. Questions have been answered. All parties agreeable.

## 2013-09-22 NOTE — Op Note (Signed)
[facility] [address] [place], [address]   COLONOSCOPY PROCEDURE REPORT  PATIENT: Anthony, Dunn  MR#:         [id number] BIRTHDATE: 12-04-1945 , 68  yrs. old GENDER: Male ENDOSCOPIST: R.  Garfield Cornea, MD FACP [facility] REFERRED BY:       [facility] PROCEDURE DATE:  09/22/2013 PROCEDURE:     Screening colonoscopy  INDICATIONS:  First ever average risk colorectal cancer screening examination  INFORMED CONSENT:  The risks, benefits, alternatives and imponderables including but not limited to bleeding, perforation as well as the possibility of a missed lesion have been reviewed.  The potential for biopsy, lesion removal, etc. have also been discussed.  Questions have been answered.  All parties agreeable. Please see the history and physical in the medical record for more information.  MEDICATIONS: Versed 6 mg IV and Demerol 100 mg IV in divided doses. Zofran 4 mg IV  DESCRIPTION OF PROCEDURE:  After a digital rectal exam was performed, the EC-3890Li ([id number])  colonoscope was advanced from the anus through the rectum and colon to the area of the cecum, ileocecal valve and appendiceal orifice.  The cecum was deeply intubated.  These structures were well-seen and photographed for the record.  From the level of the cecum and ileocecal valve, the scope was slowly and cautiously withdrawn.  The mucosal surfaces were carefully surveyed utilizing scope tip deflection to facilitate fold flattening as needed.  The scope was pulled down into the rectum where a thorough examination including retroflexion was performed.    FINDINGS:  Marginal prep-became adequate eventually as residual liquid stool lavaged and copiously suctioned out the colonic effluent. Normal rectum. Extensive left-sided diverticula; however, the remainder of the colonic mucosa appeared normal.  THERAPEUTIC / DIAGNOSTIC MANEUVERS PERFORMED: none  COMPLICATIONS: None  CECAL WITHDRAWAL TIME:  14  minutes  IMPRESSION:  Colonic diverticulosis  RECOMMENDATIONS: One more screening colonoscopy in 10 years   _______________________________ eSigned:  R. Garfield Cornea, MD FACP [facility] 09/22/2013 10:18 AM   CC:    PATIENT NAME:  Anthony, Dunn MR#: [id number]

## 2013-09-22 NOTE — Discharge Instructions (Addendum)
Colonoscopy Discharge Instructions  Read the instructions outlined below and refer to this sheet in the next few weeks. These discharge instructions provide you with general information on caring for yourself after you leave the hospital. Your doctor may also give you specific instructions. While your treatment has been planned according to the most current medical practices available, unavoidable complications occasionally occur. If you have any problems or questions after discharge, call Dr. Gala Romney at [phone]. ACTIVITY  You may resume your regular activity, but move at a slower pace for the next 24 hours.   Take frequent rest periods for the next 24 hours.   Walking will help get rid of the air and reduce the bloated feeling in your belly (abdomen).   No driving for 24 hours (because of the medicine (anesthesia) used during the test).    Do not sign any important legal documents or operate any machinery for 24 hours (because of the anesthesia used during the test).  NUTRITION  Drink plenty of fluids.   You may resume your normal diet as instructed by your doctor.   Begin with a light meal and progress to your normal diet. Heavy or fried foods are harder to digest and may make you feel sick to your stomach (nauseated).   Avoid alcoholic beverages for 24 hours or as instructed.  MEDICATIONS  You may resume your normal medications unless your doctor tells you otherwise.  WHAT YOU CAN EXPECT TODAY  Some feelings of bloating in the abdomen.   Passage of more gas than usual.   Spotting of blood in your stool or on the toilet paper.  IF YOU HAD POLYPS REMOVED DURING THE COLONOSCOPY:  No aspirin products for 7 days or as instructed.   No alcohol for 7 days or as instructed.   Eat a soft diet for the next 24 hours.  FINDING OUT THE RESULTS OF YOUR TEST Not all test results are available during your visit. If your test results are not back during the visit, make an appointment  with your caregiver to find out the results. Do not assume everything is normal if you have not heard from your caregiver or the medical facility. It is important for you to follow up on all of your test results.  SEEK IMMEDIATE MEDICAL ATTENTION IF:  You have more than a spotting of blood in your stool.   Your belly is swollen (abdominal distention).   You are nauseated or vomiting.   You have a temperature over 101.   You have abdominal pain or discomfort that is severe or gets worse throughout the day.   diverticulosis information provided  Recommend one more screening colonoscopy in 10 years  Diverticulosis Diverticulosis is a common condition that develops when small pouches (diverticula) form in the wall of the colon. The risk of diverticulosis increases with age. It happens more often in people who eat a low-fiber diet. Most individuals with diverticulosis have no symptoms. Those individuals with symptoms usually experience abdominal pain, constipation, or loose stools (diarrhea). HOME CARE INSTRUCTIONS   Increase the amount of fiber in your diet as directed by your caregiver or dietician. This may reduce symptoms of diverticulosis.  Your caregiver may recommend taking a dietary fiber supplement.  Drink at least 6 to 8 glasses of water each day to prevent constipation.  Try not to strain when you have a bowel movement.  Your caregiver may recommend avoiding nuts and seeds to prevent complications, although this is still an uncertain benefit.  Only take over-the-counter or prescription medicines for pain, discomfort, or fever as directed by your caregiver. FOODS WITH HIGH FIBER CONTENT INCLUDE:  Fruits. Apple, peach, pear, tangerine, raisins, prunes.  Vegetables. Brussels sprouts, asparagus, broccoli, cabbage, carrot, cauliflower, romaine lettuce, spinach, summer squash, tomato, winter squash, zucchini.  Starchy Vegetables. Baked beans, kidney beans, lima beans, split  peas, lentils, potatoes (with skin).  Grains. Whole wheat bread, brown rice, bran flake cereal, plain oatmeal, white rice, shredded wheat, bran muffins. SEEK IMMEDIATE MEDICAL CARE IF:   You develop increasing pain or severe bloating.  You have an oral temperature above 102 F (38.9 C), not controlled by medicine.  You develop vomiting or bowel movements that are bloody or black. Document Released: 04/27/2004 Document Revised: 10/23/2011 Document Reviewed: 12/29/2009 [facility] Patient Information 2014 [place]. High-Fiber Diet Fiber is found in fruits, vegetables, and grains. A high-fiber diet encourages the addition of more whole grains, legumes, fruits, and vegetables in your diet. The recommended amount of fiber for adult males is 38 g per day. For adult females, it is 25 g per day. Pregnant and lactating women should get 28 g of fiber per day. If you have a digestive or bowel problem, ask your caregiver for advice before adding high-fiber foods to your diet. Eat a variety of high-fiber foods instead of only a select few type of foods.  PURPOSE  To increase stool bulk.  To make bowel movements more regular to prevent constipation.  To lower cholesterol.  To prevent overeating. WHEN IS THIS DIET USED?  It may be used if you have constipation and hemorrhoids.  It may be used if you have uncomplicated diverticulosis (intestine condition) and irritable bowel syndrome.  It may be used if you need help with weight management.  It may be used if you want to add it to your diet as a protective measure against atherosclerosis, diabetes, and cancer. SOURCES OF FIBER  Whole-grain breads and cereals.  Fruits, such as apples, oranges, bananas, berries, prunes, and pears.  Vegetables, such as green peas, carrots, sweet potatoes, beets, broccoli, cabbage, spinach, and artichokes.  Legumes, such split peas, soy, lentils.  Almonds. FIBER CONTENT IN FOODS Starches and Grains /  Dietary Fiber (g)  Cheerios, 1 cup / 3 g  Corn Flakes cereal, 1 cup / 0.7 g  Rice crispy treat cereal, 1 cup / 0.3 g  Instant oatmeal (cooked),  cup / 2 g  Frosted wheat cereal, 1 cup / 5.1 g  Brown, long-grain rice (cooked), 1 cup / 3.5 g  White, long-grain rice (cooked), 1 cup / 0.6 g  Enriched macaroni (cooked), 1 cup / 2.5 g Legumes / Dietary Fiber (g)  Baked beans (canned, plain, or vegetarian),  cup / 5.2 g  Kidney beans (canned),  cup / 6.8 g  Pinto beans (cooked),  cup / 5.5 g Breads and Crackers / Dietary Fiber (g)  Plain or honey graham crackers, 2 squares / 0.7 g  Saltine crackers, 3 squares / 0.3 g  Plain, salted pretzels, 10 pieces / 1.8 g  Whole-wheat bread, 1 slice / 1.9 g  White bread, 1 slice / 0.7 g  Raisin bread, 1 slice / 1.2 g  Plain bagel, 3 oz / 2 g  Flour tortilla, 1 oz / 0.9 g  Corn tortilla, 1 small / 1.5 g  Hamburger or hotdog bun, 1 small / 0.9 g Fruits / Dietary Fiber (g)  Apple with skin, 1 medium / 4.4 g  Sweetened applesauce,  cup / 1.5 g  Banana,  medium / 1.5 g  Grapes, 10 grapes / 0.4 g  Orange, 1 small / 2.3 g  Raisin, 1.5 oz / 1.6 g  Melon, 1 cup / 1.4 g Vegetables / Dietary Fiber (g)  Green beans (canned),  cup / 1.3 g  Carrots (cooked),  cup / 2.3 g  Broccoli (cooked),  cup / 2.8 g  Peas (cooked),  cup / 4.4 g  Mashed potatoes,  cup / 1.6 g  Lettuce, 1 cup / 0.5 g  Corn (canned),  cup / 1.6 g  Tomato,  cup / 1.1 g Document Released: 07/31/2005 Document Revised: 01/30/2012 Document Reviewed: 11/02/2011 [facility] Patient Information 2014 [place], [place].

## 2013-09-25 ENCOUNTER — Encounter (HOSPITAL_COMMUNITY): Payer: Self-pay | Admitting: Internal Medicine

## 2015-03-15 ENCOUNTER — Encounter (HOSPITAL_COMMUNITY): Payer: Self-pay | Admitting: Emergency Medicine

## 2015-03-15 ENCOUNTER — Emergency Department (HOSPITAL_COMMUNITY)
Admission: EM | Admit: 2015-03-15 | Discharge: 2015-03-16 | Disposition: A | Discharge status: Home / Self Care | Payer: Medicare Other | Attending: Emergency Medicine | Admitting: Emergency Medicine

## 2015-03-15 ENCOUNTER — Emergency Department (HOSPITAL_COMMUNITY): Payer: Medicare Other

## 2015-03-15 DIAGNOSIS — Z7982 Long term (current) use of aspirin: Secondary | ICD-10-CM | POA: Diagnosis not present

## 2015-03-15 DIAGNOSIS — R630 Anorexia: Secondary | ICD-10-CM | POA: Diagnosis not present

## 2015-03-15 DIAGNOSIS — R509 Fever, unspecified: Secondary | ICD-10-CM | POA: Diagnosis not present

## 2015-03-15 DIAGNOSIS — K219 Gastro-esophageal reflux disease without esophagitis: Secondary | ICD-10-CM | POA: Diagnosis not present

## 2015-03-15 DIAGNOSIS — G629 Polyneuropathy, unspecified: Secondary | ICD-10-CM | POA: Insufficient documentation

## 2015-03-15 DIAGNOSIS — E780 Pure hypercholesterolemia: Secondary | ICD-10-CM | POA: Diagnosis not present

## 2015-03-15 DIAGNOSIS — Z9861 Coronary angioplasty status: Secondary | ICD-10-CM | POA: Diagnosis not present

## 2015-03-15 DIAGNOSIS — R531 Weakness: Secondary | ICD-10-CM | POA: Diagnosis present

## 2015-03-15 DIAGNOSIS — Z87891 Personal history of nicotine dependence: Secondary | ICD-10-CM | POA: Diagnosis not present

## 2015-03-15 DIAGNOSIS — I10 Essential (primary) hypertension: Secondary | ICD-10-CM | POA: Insufficient documentation

## 2015-03-15 DIAGNOSIS — Z79899 Other long term (current) drug therapy: Secondary | ICD-10-CM | POA: Insufficient documentation

## 2015-03-15 DIAGNOSIS — D649 Anemia, unspecified: Secondary | ICD-10-CM | POA: Diagnosis not present

## 2015-03-15 DIAGNOSIS — Z7902 Long term (current) use of antithrombotics/antiplatelets: Secondary | ICD-10-CM | POA: Diagnosis not present

## 2015-03-15 DIAGNOSIS — I739 Peripheral vascular disease, unspecified: Secondary | ICD-10-CM | POA: Diagnosis not present

## 2015-03-15 DIAGNOSIS — E119 Type 2 diabetes mellitus without complications: Secondary | ICD-10-CM | POA: Diagnosis not present

## 2015-03-15 LAB — CBC WITH DIFFERENTIAL/PLATELET
Basophils Absolute: 0 10*3/uL (ref 0.0–0.1)
Basophils Relative: 0 % (ref 0–1)
Eosinophils Absolute: 0 10*3/uL (ref 0.0–0.7)
Eosinophils Relative: 0 % (ref 0–5)
HCT: 29.4 % — ABNORMAL LOW (ref 39.0–52.0)
HEMOGLOBIN: 9.5 g/dL — AB (ref 13.0–17.0)
LYMPHS ABS: 0.9 10*3/uL (ref 0.7–4.0)
Lymphocytes Relative: 9 % — ABNORMAL LOW (ref 12–46)
MCH: 26.2 pg (ref 26.0–34.0)
MCHC: 32.3 g/dL (ref 30.0–36.0)
MCV: 81.2 fL (ref 78.0–100.0)
MONOS PCT: 11 % (ref 3–12)
Monocytes Absolute: 1.1 10*3/uL — ABNORMAL HIGH (ref 0.1–1.0)
Neutro Abs: 8 10*3/uL — ABNORMAL HIGH (ref 1.7–7.7)
Neutrophils Relative %: 80 % — ABNORMAL HIGH (ref 43–77)
PLATELETS: 379 10*3/uL (ref 150–400)
RBC: 3.62 MIL/uL — AB (ref 4.22–5.81)
RDW: 14.3 % (ref 11.5–15.5)
WBC: 10 10*3/uL (ref 4.0–10.5)

## 2015-03-15 LAB — COMPREHENSIVE METABOLIC PANEL
ALK PHOS: 63 U/L (ref 38–126)
ALT: 13 U/L — AB (ref 17–63)
ANION GAP: 12 (ref 5–15)
AST: 21 U/L (ref 15–41)
Albumin: 3.9 g/dL (ref 3.5–5.0)
BUN: 31 mg/dL — ABNORMAL HIGH (ref 6–20)
CO2: 22 mmol/L (ref 22–32)
Calcium: 8.7 mg/dL — ABNORMAL LOW (ref 8.9–10.3)
Chloride: 96 mmol/L — ABNORMAL LOW (ref 101–111)
Creatinine, Ser: 1.57 mg/dL — ABNORMAL HIGH (ref 0.61–1.24)
GFR calc Af Amer: 51 mL/min — ABNORMAL LOW (ref >60)
GFR, EST NON AFRICAN AMERICAN: 44 mL/min — AB (ref >60)
Glucose, Bld: 201 mg/dL — ABNORMAL HIGH (ref 65–99)
Potassium: 4.1 mmol/L (ref 3.5–5.1)
SODIUM: 130 mmol/L — AB (ref 135–145)
TOTAL PROTEIN: 7.2 g/dL (ref 6.5–8.1)
Total Bilirubin: 0.5 mg/dL (ref 0.3–1.2)

## 2015-03-15 LAB — LACTIC ACID, PLASMA: Lactic Acid, Venous: 2 mmol/L (ref 0.5–2.0)

## 2015-03-15 LAB — CBG MONITORING, ED: GLUCOSE-CAPILLARY: 198 mg/dL — AB (ref 65–99)

## 2015-03-15 LAB — TROPONIN I

## 2015-03-15 NOTE — ED Notes (Signed)
Patient woke this morning feeling weak and fatigued.  Has had chills and subjective fevers all day.  Denies SOB, cough, urinary symptoms.

## 2015-03-15 NOTE — ED Provider Notes (Signed)
CSN: [id number]   Arrival date & time 03/15/15 2053  History  This chart was scribed for  [name], MD by Altamease Oiler, ED Scribe. This patient was seen in room APA12/[facility]12 and the patient's care was started at 10:31 PM.  Chief Complaint  Patient presents with  . Weakness    HPI The history is provided by the patient and a relative. No language interpreter was used.   [name] is a 69 y.o. male who presents to the Emergency Department complaining of generalized weakness with onset this morning upon waking. Associated symptoms include chills, fever of 101 at home, and increased LE pain (pt had surgery at the beginning of June 2016 to improve the circulation in his legs). Pt and his family note that his symptoms have improved since being in the ED. He last used ibuprofen around 8:15 PM. His blood sugar was over 300 at home. The pt attended his mother-in-law's burial today and has had a decreased appetite. His wife notes that his blood sugar dropped at the cemetary and was given a "health bar". No sneezing, coughing, rash, and difficulty urinating. No recent tick bites. Pt took his home medication as prescribed today.  PCP at the [facility] in [place]. There are no other known modifying factors.  Past Medical History  Diagnosis Date  . Hypertension   . Hypercholesteremia   . GERD (gastroesophageal reflux disease)   . Diabetes mellitus without complication   . Neuropathy   . Peripheral vascular disease     Past Surgical History  Procedure Laterality Date  . Cardiac catheterization  2003    with stent  . Femoral-popliteal bypass graft    . Colonoscopy N/A 09/22/2013    Procedure: COLONOSCOPY;  Surgeon: Daneil Dolin, MD;  Location: [facility];  Service: Endoscopy;  Laterality: N/A;  9:30 AM    Family History  Problem Relation Age of Onset  . Colon cancer Neg Hx     History  Substance Use Topics  . Smoking status: Former Smoker -- 2.00 packs/day for 30 years    Types:  Cigarettes  . Smokeless tobacco: Not on file  . Alcohol Use: No     Review of Systems  Constitutional: Positive for fever, chills and appetite change.  Musculoskeletal:       LE pain  Neurological: Positive for weakness.  All other systems reviewed and are negative.    Home Medications   Prior to Admission medications   Medication Sig Start Date End Date Taking? Authorizing Provider  amLODipine (NORVASC) 10 MG tablet Take 10 mg by mouth daily.    Historical Provider, MD  aspirin 81 MG tablet Take 81 mg by mouth daily.    Historical Provider, MD  cetirizine (ZYRTEC) 10 MG tablet Take 10 mg by mouth daily.    Historical Provider, MD  Cholecalciferol (VITAMIN D) 2000 UNITS tablet Take 2,000 Units by mouth daily.    Historical Provider, MD  clopidogrel (PLAVIX) 75 MG tablet Take 75 mg by mouth daily with breakfast.    Historical Provider, MD  gabapentin (NEURONTIN) 300 MG capsule Take 300 mg by mouth daily.    Historical Provider, MD  glipiZIDE (GLUCOTROL) 5 MG tablet Take 10 mg by mouth 2 (two) times daily before a meal.     Historical Provider, MD  hydrochlorothiazide (HYDRODIURIL) 25 MG tablet Take 25 mg by mouth daily.    Historical Provider, MD  lisinopril (PRINIVIL,ZESTRIL) 40 MG tablet Take 40 mg by mouth daily.  Historical Provider, MD  metFORMIN (GLUMETZA) 500 MG (MOD) 24 hr tablet Take 1,000 mg by mouth 2 (two) times daily with a meal.     Historical Provider, MD  pantoprazole (PROTONIX) 40 MG tablet Take 40 mg by mouth daily.    Historical Provider, MD  peg 3350 powder (MOVIPREP) 100 G SOLR Take 1 kit (200 g total) by mouth as directed. 09/04/13   Daneil Dolin, MD  polyethylene glycol-electrolytes (TRILYTE) 420 G solution Take 4,000 mLs by mouth as directed. 09/18/13   Daneil Dolin, MD  simvastatin (ZOCOR) 40 MG tablet Take 20 mg by mouth at bedtime.     Historical Provider, MD    Allergies  Review of patient's allergies indicates no known allergies.  Triage Vitals: BP  127/54 mmHg  Pulse 105  Temp(Src) 99.7 F (37.6 C) (Oral)  Resp 18  Ht '5\' 7"'  (1.702 m)  Wt 158 lb (71.668 kg)  BMI 24.74 kg/m2  SpO2 98%  Physical Exam  Constitutional: He is oriented to person, place, and time. He appears well-developed and well-nourished.  HENT:  Head: Normocephalic and atraumatic.  Right Ear: External ear normal.  Left Ear: External ear normal.  Eyes: Conjunctivae and EOM are normal. Pupils are equal, round, and reactive to light.  Neck: Normal range of motion and phonation normal. Neck supple.  Cardiovascular: Normal rate, regular rhythm and normal heart sounds.   Pulmonary/Chest: Effort normal. He has no wheezes. He has no rhonchi. He has no rales. He exhibits no bony tenderness.  Decreased air movement bilaterally   Abdominal: Soft. There is no tenderness.  Genitourinary:  Normal anus. Small amount of brown stool in the rectum. No rectal mass or fecal impaction.  Musculoskeletal: Normal range of motion.  Neurological: He is alert and oriented to person, place, and time. No cranial nerve deficit or sensory deficit. He exhibits normal muscle tone. Coordination normal.  Skin: Skin is warm, dry and intact.  Psychiatric: He has a normal mood and affect. His behavior is normal. Judgment and thought content normal.  Nursing note and vitals reviewed.   ED Course  Procedures   DIAGNOSTIC STUDIES: Oxygen Saturation is 98% on RA, normal by my interpretation.    COORDINATION OF CARE: 10:39 PM Discussed treatment plan which includes CXR, EKG, and lab work with pt at bedside and pt agreed to plan.  Medications - No data to display   Patient Vitals for the past 24 hrs:  BP Temp Temp src Pulse Resp SpO2 Height Weight  03/15/15 2230 111/57 mmHg - - 83 - 97 % - -  03/15/15 2200 (!) 122/53 mmHg - - 78 - 96 % - -  03/15/15 2130 104/60 mmHg - - 87 - 95 % - -  03/15/15 2056 (!) 127/54 mmHg 99.7 F (37.6 C) Oral 105 18 98 % '5\' 7"'  (1.702 m) 158 lb (71.668 kg)   12:08  AM Reevaluation with update and discussion. After initial assessment and treatment, an updated evaluation reveals he is tolerating oral fluids but has not been able to urinate, yet. IV fluids ordered.[name] L    Labs Review-  Labs Reviewed  CBC WITH DIFFERENTIAL/PLATELET - Abnormal; Notable for the following:    RBC 3.62 (*)    Hemoglobin 9.5 (*)    HCT 29.4 (*)    Neutrophils Relative % 80 (*)    Neutro Abs 8.0 (*)    Lymphocytes Relative 9 (*)    Monocytes Absolute 1.1 (*)    All  other components within normal limits  COMPREHENSIVE METABOLIC PANEL - Abnormal; Notable for the following:    Sodium 130 (*)    Chloride 96 (*)    Glucose, Bld 201 (*)    BUN 31 (*)    Creatinine, Ser 1.57 (*)    Calcium 8.7 (*)    ALT 13 (*)    GFR calc non Af Amer 44 (*)    GFR calc Af Amer 51 (*)    All other components within normal limits  CBG MONITORING, ED - Abnormal; Notable for the following:    Glucose-Capillary 198 (*)    All other components within normal limits  URINE CULTURE  TROPONIN I  LACTIC ACID, PLASMA  LACTIC ACID, PLASMA  URINALYSIS, ROUTINE W REFLEX MICROSCOPIC (NOT AT [facility])    Imaging Review No results found.    EKG Interpretation  Date/Time:  Monday March 15 2015 21:28:28 EDT Ventricular Rate:  87 PR Interval:  206 QRS Duration: 107 QT Interval:  351 QTC Calculation: 422 R Axis:   54 Text Interpretation:  Sinus rhythm Low voltage, precordial leads since last tracing no significant change Confirmed by Viktoriya Glaspy  MD, Vira Agar ([id number]) on 03/15/2015 11:11:39 PM           MDM   Final diagnoses:  Febrile illness  Anemia, unspecified anemia type   Febrile illness without clear localizing symptoms. Symptoms are, short-term, and he improved after being treated with ibuprofen, prior to arrival. Screening evaluation done.   Nursing Notes Reviewed/ Care Coordinated, and agree without changes. Applicable Imaging Reviewed.  Interpretation of Laboratory Data  incorporated into ED treatment   I personally performed the services described in this documentation, which was scribed in my presence. The recorded information has been reviewed and is accurate.    00:35- Care to Dr. Alvino Chapel to evaluate after return of urinalysis.  [name], MD 03/16/15 [phone]

## 2015-03-16 LAB — URINALYSIS, ROUTINE W REFLEX MICROSCOPIC
BILIRUBIN URINE: NEGATIVE
Glucose, UA: 500 mg/dL — AB
Ketones, ur: NEGATIVE mg/dL
LEUKOCYTES UA: NEGATIVE
Nitrite: NEGATIVE
Protein, ur: NEGATIVE mg/dL
Specific Gravity, Urine: 1.01 (ref 1.005–1.030)
UROBILINOGEN UA: 0.2 mg/dL (ref 0.0–1.0)
pH: 6 (ref 5.0–8.0)

## 2015-03-16 LAB — POC OCCULT BLOOD, ED: Fecal Occult Bld: NEGATIVE

## 2015-03-16 LAB — URINE MICROSCOPIC-ADD ON

## 2015-03-16 MED ORDER — SODIUM CHLORIDE 0.9 % IV SOLN
INTRAVENOUS | Status: DC
  Administered 2015-03-16: via INTRAVENOUS

## 2015-03-16 MED ORDER — SODIUM CHLORIDE 0.9 % IV BOLUS (SEPSIS)
500.0000 mL | Freq: Once | INTRAVENOUS | Status: AC
  Administered 2015-03-16: 500 mL via INTRAVENOUS

## 2015-03-16 NOTE — Discharge Instructions (Signed)
Anemia, Nonspecific Anemia is a condition in which the concentration of red blood cells or hemoglobin in the blood is below normal. Hemoglobin is a substance in red blood cells that carries oxygen to the tissues of the body. Anemia results in not enough oxygen reaching these tissues.  CAUSES  Common causes of anemia include:   Excessive bleeding. Bleeding may be internal or external. This includes excessive bleeding from periods (in women) or from the intestine.   Poor nutrition.   Chronic kidney, thyroid, and liver disease.  Bone marrow disorders that decrease red blood cell production.  Cancer and treatments for cancer.  HIV, AIDS, and their treatments.  Spleen problems that increase red blood cell destruction.  Blood disorders.  Excess destruction of red blood cells due to infection, medicines, and autoimmune disorders. SIGNS AND SYMPTOMS   Minor weakness.   Dizziness.   Headache.  Palpitations.   Shortness of breath, especially with exercise.   Paleness.  Cold sensitivity.  Indigestion.  Nausea.  Difficulty sleeping.  Difficulty concentrating. Symptoms may occur suddenly or they may develop slowly.  DIAGNOSIS  Additional blood tests are often needed. These help your health care provider determine the best treatment. Your health care provider will check your stool for blood and look for other causes of blood loss.  TREATMENT  Treatment varies depending on the cause of the anemia. Treatment can include:   Supplements of iron, vitamin S93, or folic acid.   Hormone medicines.   A blood transfusion. This may be needed if blood loss is severe.   Hospitalization. This may be needed if there is significant continual blood loss.   Dietary changes.  Spleen removal. HOME CARE INSTRUCTIONS Keep all follow-up appointments. It often takes many weeks to correct anemia, and having your health care provider check on your condition and your response to  treatment is very important. SEEK IMMEDIATE MEDICAL CARE IF:   You develop extreme weakness, shortness of breath, or chest pain.   You become dizzy or have trouble concentrating.  You develop heavy vaginal bleeding.   You develop a rash.   You have bloody or black, tarry stools.   You faint.   You vomit up blood.   You vomit repeatedly.   You have abdominal pain.  You have a fever or persistent symptoms for more than 2-3 days.   You have a fever and your symptoms suddenly get worse.   You are dehydrated.  MAKE SURE YOU:  Understand these instructions.  Will watch your condition.  Will get help right away if you are not doing well or get worse. Document Released: 09/07/2004 Document Revised: 04/02/2013 Document Reviewed: 01/24/2013 [facility] Patient Information 2015 [place], [place]. This information is not intended to replace advice given to you by your health care provider. Make sure you discuss any questions you have with your health care provider.  Fever, Adult A fever is a higher than normal body temperature. In an adult, an oral temperature around 98.6 F (37 C) is considered normal. A temperature of 100.4 F (38 C) or higher is generally considered a fever. Mild or moderate fevers generally have no long-term effects and often do not require treatment. Extreme fever (greater than or equal to 106 F or 41.1 C) can cause seizures. The sweating that may occur with repeated or prolonged fever may cause dehydration. Elderly people can develop confusion during a fever. A measured temperature can vary with:  Age.  Time of day.  Method of measurement (mouth, underarm,  rectal, or ear). The fever is confirmed by taking a temperature with a thermometer. Temperatures can be taken different ways. Some methods are accurate and some are not.  An oral temperature is used most commonly. Electronic thermometers are fast and accurate.  An ear temperature will only be  accurate if the thermometer is positioned as recommended by the manufacturer.  A rectal temperature is accurate and done for those adults who have a condition where an oral temperature cannot be taken.  An underarm (axillary) temperature is not accurate and not recommended. Fever is a symptom, not a disease.  CAUSES   Infections commonly cause fever.  Some noninfectious causes for fever include:  Some arthritis conditions.  Some thyroid or adrenal gland conditions.  Some immune system conditions.  Some types of cancer.  A medicine reaction.  High doses of certain street drugs such as methamphetamine.  Dehydration.  Exposure to high outside or room temperatures.  Occasionally, the source of a fever cannot be determined. This is sometimes called a "fever of unknown origin" (FUO).  Some situations may lead to a temporary rise in body temperature that may go away on its own. Examples are:  Childbirth.  Surgery.  Intense exercise. HOME CARE INSTRUCTIONS   Take appropriate medicines for fever. Follow dosing instructions carefully. If you use acetaminophen to reduce the fever, be careful to avoid taking other medicines that also contain acetaminophen. Do not take aspirin for a fever if you are younger than age 26. There is an association with Reye's syndrome. Reye's syndrome is a rare but potentially deadly disease.  If an infection is present and antibiotics have been prescribed, take them as directed. Finish them even if you start to feel better.  Rest as needed.  Maintain an adequate fluid intake. To prevent dehydration during an illness with prolonged or recurrent fever, you may need to drink extra fluid.Drink enough fluids to keep your urine clear or pale yellow.  Sponging or bathing with room temperature water may help reduce body temperature. Do not use ice water or alcohol sponge baths.  Dress comfortably, but do not over-bundle. SEEK MEDICAL CARE IF:   You are  unable to keep fluids down.  You develop vomiting or diarrhea.  You are not feeling at least partly better after 3 days.  You develop new symptoms or problems. SEEK IMMEDIATE MEDICAL CARE IF:   You have shortness of breath or trouble breathing.  You develop excessive weakness.  You are dizzy or you faint.  You are extremely thirsty or you are making little or no urine.  You develop new pain that was not there before (such as in the head, neck, chest, back, or abdomen).  You have persistent vomiting and diarrhea for more than 1 to 2 days.  You develop a stiff neck or your eyes become sensitive to light.  You develop a skin rash.  You have a fever or persistent symptoms for more than 2 to 3 days.  You have a fever and your symptoms suddenly get worse. MAKE SURE YOU:   Understand these instructions.  Will watch your condition.  Will get help right away if you are not doing well or get worse. Document Released: 01/24/2001 Document Revised: 12/15/2013 Document Reviewed: 06/01/2011 [facility] Patient Information 2015 [place], [place]. This information is not intended to replace advice given to you by your health care provider. Make sure you discuss any questions you have with your health care provider.

## 2015-03-16 NOTE — ED Provider Notes (Signed)
  Physical Exam  BP 100/74 mmHg  Pulse 71  Temp(Src) 99.7 F (37.6 C) (Oral)  Resp 20  Ht 5\' 7"  (1.702 m)  Wt 158 lb (71.668 kg)  BMI 24.74 kg/m2  SpO2 94%  Physical Exam  ED Course  Procedures  MDM Patient feels better after treatment. Mild anemia. Will follow-up with his PCP.  Davonna Belling, MD 03/16/15 [phone]

## 2015-03-19 LAB — URINE CULTURE: Culture: 30000

## 2015-03-20 NOTE — Progress Notes (Signed)
ED Antimicrobial Stewardship Positive Culture Follow Up   DUC [name] is an 69 y.o. male who presented to [facility] on 03/15/2015 with a chief complaint of  Chief Complaint  Patient presents with  . Weakness    Recent Results (from the past 720 hour(s))  Urine culture     Status: None   Collection Time: 03/16/15  1:20 AM  Result Value Ref Range Status   Specimen Description URINE, CLEAN CATCH  Final   Special Requests NONE  Final   Culture   Final    30,000 COLONIES/mL ESCHERICHIA COLI Performed at [facility]    Report Status 03/19/2015 FINAL  Final   Organism ID, Bacteria ESCHERICHIA COLI  Final      Susceptibility   Escherichia coli - MIC*    AMPICILLIN >=32 RESISTANT Resistant     CEFAZOLIN <=4 SENSITIVE Sensitive     CEFTRIAXONE <=1 SENSITIVE Sensitive     CIPROFLOXACIN <=0.25 SENSITIVE Sensitive     GENTAMICIN <=1 SENSITIVE Sensitive     IMIPENEM <=0.25 SENSITIVE Sensitive     NITROFURANTOIN <=16 SENSITIVE Sensitive     TRIMETH/SULFA >=320 RESISTANT Resistant     AMPICILLIN/SULBACTAM 16 INTERMEDIATE Intermediate     PIP/TAZO <=4 SENSITIVE Sensitive     * 30,000 COLONIES/mL ESCHERICHIA COLI    UA and Urine Micro negative, no urinary symptoms, low colony count.  This represents asymptomatic bacteriuria and no treatment is required.   Norva Riffle 03/20/2015, 8:15 AM Infectious Diseases Pharmacist Phone# [phone]

## 2015-07-09 ENCOUNTER — Encounter (HOSPITAL_COMMUNITY): Payer: Self-pay

## 2015-07-09 ENCOUNTER — Inpatient Hospital Stay (HOSPITAL_COMMUNITY)
Admission: EM | Admit: 2015-07-09 | Discharge: 2015-07-12 | DRG: 871 | Disposition: A | Discharge status: Home / Self Care | Payer: Medicare Other | Attending: Internal Medicine | Admitting: Internal Medicine

## 2015-07-09 ENCOUNTER — Emergency Department (HOSPITAL_COMMUNITY): Payer: Medicare Other

## 2015-07-09 DIAGNOSIS — I129 Hypertensive chronic kidney disease with stage 1 through stage 4 chronic kidney disease, or unspecified chronic kidney disease: Secondary | ICD-10-CM | POA: Diagnosis not present

## 2015-07-09 DIAGNOSIS — E1151 Type 2 diabetes mellitus with diabetic peripheral angiopathy without gangrene: Secondary | ICD-10-CM | POA: Diagnosis not present

## 2015-07-09 DIAGNOSIS — I2510 Atherosclerotic heart disease of native coronary artery without angina pectoris: Secondary | ICD-10-CM | POA: Diagnosis present

## 2015-07-09 DIAGNOSIS — J189 Pneumonia, unspecified organism: Secondary | ICD-10-CM | POA: Diagnosis present

## 2015-07-09 DIAGNOSIS — G629 Polyneuropathy, unspecified: Secondary | ICD-10-CM | POA: Diagnosis present

## 2015-07-09 DIAGNOSIS — A419 Sepsis, unspecified organism: Secondary | ICD-10-CM | POA: Diagnosis present

## 2015-07-09 DIAGNOSIS — I10 Essential (primary) hypertension: Secondary | ICD-10-CM

## 2015-07-09 DIAGNOSIS — I959 Hypotension, unspecified: Secondary | ICD-10-CM | POA: Diagnosis present

## 2015-07-09 DIAGNOSIS — N179 Acute kidney failure, unspecified: Secondary | ICD-10-CM | POA: Diagnosis present

## 2015-07-09 DIAGNOSIS — I9589 Other hypotension: Secondary | ICD-10-CM

## 2015-07-09 DIAGNOSIS — Z951 Presence of aortocoronary bypass graft: Secondary | ICD-10-CM

## 2015-07-09 DIAGNOSIS — I739 Peripheral vascular disease, unspecified: Secondary | ICD-10-CM

## 2015-07-09 DIAGNOSIS — K219 Gastro-esophageal reflux disease without esophagitis: Secondary | ICD-10-CM | POA: Diagnosis not present

## 2015-07-09 DIAGNOSIS — E785 Hyperlipidemia, unspecified: Secondary | ICD-10-CM

## 2015-07-09 DIAGNOSIS — Z87891 Personal history of nicotine dependence: Secondary | ICD-10-CM | POA: Diagnosis not present

## 2015-07-09 DIAGNOSIS — I951 Orthostatic hypotension: Secondary | ICD-10-CM

## 2015-07-09 LAB — CBC WITH DIFFERENTIAL/PLATELET
BASOS ABS: 0 10*3/uL (ref 0.0–0.1)
BASOS PCT: 0 %
Eosinophils Absolute: 0.1 10*3/uL (ref 0.0–0.7)
Eosinophils Relative: 1 %
HEMATOCRIT: 34.1 % — AB (ref 39.0–52.0)
Hemoglobin: 11.4 g/dL — ABNORMAL LOW (ref 13.0–17.0)
Lymphocytes Relative: 8 %
Lymphs Abs: 1.4 10*3/uL (ref 0.7–4.0)
MCH: 27.5 pg (ref 26.0–34.0)
MCHC: 33.4 g/dL (ref 30.0–36.0)
MCV: 82.4 fL (ref 78.0–100.0)
MONO ABS: 1.3 10*3/uL — AB (ref 0.1–1.0)
Monocytes Relative: 7 %
NEUTROS ABS: 15.4 10*3/uL — AB (ref 1.7–7.7)
NEUTROS PCT: 84 %
Platelets: 501 10*3/uL — ABNORMAL HIGH (ref 150–400)
RBC: 4.14 MIL/uL — AB (ref 4.22–5.81)
RDW: 15.8 % — AB (ref 11.5–15.5)
WBC: 18.2 10*3/uL — AB (ref 4.0–10.5)

## 2015-07-09 LAB — HEPATIC FUNCTION PANEL
ALBUMIN: 3.7 g/dL (ref 3.5–5.0)
ALK PHOS: 60 U/L (ref 38–126)
ALT: 13 U/L — AB (ref 17–63)
AST: 21 U/L (ref 15–41)
BILIRUBIN TOTAL: 0.6 mg/dL (ref 0.3–1.2)
Bilirubin, Direct: <0.1 mg/dL — ABNORMAL LOW (ref 0.1–0.5)
Total Protein: 7.3 g/dL (ref 6.5–8.1)

## 2015-07-09 LAB — MRSA PCR SCREENING: MRSA BY PCR: NEGATIVE

## 2015-07-09 LAB — BASIC METABOLIC PANEL
ANION GAP: 13 (ref 5–15)
BUN: 29 mg/dL — ABNORMAL HIGH (ref 6–20)
CHLORIDE: 102 mmol/L (ref 101–111)
CO2: 19 mmol/L — ABNORMAL LOW (ref 22–32)
CREATININE: 1.62 mg/dL — AB (ref 0.61–1.24)
Calcium: 9.4 mg/dL (ref 8.9–10.3)
GFR calc non Af Amer: 42 mL/min — ABNORMAL LOW (ref >60)
GFR, EST AFRICAN AMERICAN: 49 mL/min — AB (ref >60)
Glucose, Bld: 167 mg/dL — ABNORMAL HIGH (ref 65–99)
POTASSIUM: 4.8 mmol/L (ref 3.5–5.1)
SODIUM: 134 mmol/L — AB (ref 135–145)

## 2015-07-09 LAB — BLOOD GAS, ARTERIAL
ACID-BASE EXCESS: 7.3 mmol/L — AB (ref 0.0–2.0)
Bicarbonate: 18.9 mEq/L — ABNORMAL LOW (ref 20.0–24.0)
DRAWN BY: 22223
O2 CONTENT: 3.5 L/min
O2 SAT: 94.9 %
PATIENT TEMPERATURE: 37.3
TCO2: 11.7 mmol/L (ref 0–100)
pCO2 arterial: 25.1 mmHg — ABNORMAL LOW (ref 35.0–45.0)
pH, Arterial: 7.427 (ref 7.350–7.450)
pO2, Arterial: 83 mmHg (ref 80.0–100.0)

## 2015-07-09 LAB — TSH: TSH: 0.967 u[IU]/mL (ref 0.350–4.500)

## 2015-07-09 LAB — TROPONIN I: Troponin I: <0.03 ng/mL (ref <0.031)

## 2015-07-09 LAB — URINALYSIS, ROUTINE W REFLEX MICROSCOPIC
Bilirubin Urine: NEGATIVE
Glucose, UA: 100 mg/dL — AB
LEUKOCYTES UA: NEGATIVE
NITRITE: NEGATIVE
PH: 5.5 (ref 5.0–8.0)
Protein, ur: NEGATIVE mg/dL
SPECIFIC GRAVITY, URINE: 1.02 (ref 1.005–1.030)

## 2015-07-09 LAB — INFLUENZA PANEL BY PCR (TYPE A & B)
H1N1 flu by pcr: NOT DETECTED
Influenza A By PCR: NEGATIVE
Influenza B By PCR: NEGATIVE

## 2015-07-09 LAB — URINE MICROSCOPIC-ADD ON: RBC / HPF: NONE SEEN RBC/hpf (ref 0–5)

## 2015-07-09 LAB — GLUCOSE, CAPILLARY
GLUCOSE-CAPILLARY: 152 mg/dL — AB (ref 65–99)
Glucose-Capillary: 146 mg/dL — ABNORMAL HIGH (ref 65–99)
Glucose-Capillary: 151 mg/dL — ABNORMAL HIGH (ref 65–99)
Glucose-Capillary: 185 mg/dL — ABNORMAL HIGH (ref 65–99)

## 2015-07-09 LAB — CBG MONITORING, ED: GLUCOSE-CAPILLARY: 183 mg/dL — AB (ref 65–99)

## 2015-07-09 LAB — LACTIC ACID, PLASMA: LACTIC ACID, VENOUS: 2.6 mmol/L — AB (ref 0.5–2.0)

## 2015-07-09 LAB — PROCALCITONIN: Procalcitonin: <0.1 ng/mL

## 2015-07-09 MED ORDER — GUAIFENESIN ER 600 MG PO TB12
1200.0000 mg | ORAL_TABLET | Freq: Two times a day (BID) | ORAL | Status: DC
  Administered 2015-07-09 – 2015-07-12 (×7): 1200 mg via ORAL
  Filled 2015-07-09 (×9): qty 2

## 2015-07-09 MED ORDER — PIPERACILLIN-TAZOBACTAM 3.375 G IVPB 30 MIN
3.3750 g | Freq: Once | INTRAVENOUS | Status: AC
  Administered 2015-07-09: 3.375 g via INTRAVENOUS
  Filled 2015-07-09: qty 50

## 2015-07-09 MED ORDER — ASPIRIN 81 MG PO CHEW
81.0000 mg | CHEWABLE_TABLET | Freq: Every day | ORAL | Status: DC
  Administered 2015-07-09 – 2015-07-12 (×4): 81 mg via ORAL
  Filled 2015-07-09 (×4): qty 1

## 2015-07-09 MED ORDER — VANCOMYCIN HCL IN DEXTROSE 750-5 MG/150ML-% IV SOLN
750.0000 mg | Freq: Two times a day (BID) | INTRAVENOUS | Status: DC
  Administered 2015-07-09 – 2015-07-10 (×2): 750 mg via INTRAVENOUS
  Filled 2015-07-09 (×3): qty 150

## 2015-07-09 MED ORDER — SODIUM CHLORIDE 0.9 % IV BOLUS (SEPSIS)
1000.0000 mL | Freq: Once | INTRAVENOUS | Status: DC
  Administered 2015-07-09: 1000 mL via INTRAVENOUS

## 2015-07-09 MED ORDER — VANCOMYCIN HCL IN DEXTROSE 1-5 GM/200ML-% IV SOLN
1000.0000 mg | Freq: Once | INTRAVENOUS | Status: AC
  Administered 2015-07-09: 1000 mg via INTRAVENOUS
  Filled 2015-07-09: qty 200

## 2015-07-09 MED ORDER — SODIUM CHLORIDE 0.9 % IV SOLN
1000.0000 mL | INTRAVENOUS | Status: DC
  Administered 2015-07-09: 1000 mL via INTRAVENOUS

## 2015-07-09 MED ORDER — DEXTROSE 5 % IV SOLN
1.0000 g | Freq: Two times a day (BID) | INTRAVENOUS | Status: DC
  Administered 2015-07-09 – 2015-07-10 (×3): 1 g via INTRAVENOUS
  Filled 2015-07-09 (×4): qty 1

## 2015-07-09 MED ORDER — SODIUM CHLORIDE 0.9 % IV BOLUS (SEPSIS)
1000.0000 mL | INTRAVENOUS | Status: AC
  Administered 2015-07-09 (×2): 1000 mL via INTRAVENOUS

## 2015-07-09 MED ORDER — ONDANSETRON HCL 4 MG/2ML IJ SOLN: 4.0000 mg | Freq: Four times a day (QID) | INTRAMUSCULAR | Status: DC | PRN

## 2015-07-09 MED ORDER — ONDANSETRON HCL 4 MG PO TABS: 4.0000 mg | ORAL_TABLET | Freq: Four times a day (QID) | ORAL | Status: DC | PRN

## 2015-07-09 MED ORDER — INSULIN ASPART 100 UNIT/ML ~~LOC~~ SOLN
0.0000 [IU] | Freq: Every day | SUBCUTANEOUS | Status: DC
  Administered 2015-07-10: 2 [IU] via SUBCUTANEOUS
  Administered 2015-07-11: 3 [IU] via SUBCUTANEOUS

## 2015-07-09 MED ORDER — ACETAMINOPHEN 500 MG PO TABS
1000.0000 mg | ORAL_TABLET | Freq: Once | ORAL | Status: AC
  Administered 2015-07-09: 1000 mg via ORAL
  Filled 2015-07-09: qty 2

## 2015-07-09 MED ORDER — INSULIN ASPART 100 UNIT/ML ~~LOC~~ SOLN
0.0000 [IU] | Freq: Three times a day (TID) | SUBCUTANEOUS | Status: DC
  Administered 2015-07-09 (×2): 2 [IU] via SUBCUTANEOUS
  Administered 2015-07-09: 1 [IU] via SUBCUTANEOUS
  Administered 2015-07-10 (×2): 3 [IU] via SUBCUTANEOUS
  Administered 2015-07-10 – 2015-07-11 (×2): 2 [IU] via SUBCUTANEOUS
  Administered 2015-07-11: 3 [IU] via SUBCUTANEOUS
  Administered 2015-07-11: 5 [IU] via SUBCUTANEOUS
  Administered 2015-07-12: 3 [IU] via SUBCUTANEOUS

## 2015-07-09 MED ORDER — SODIUM CHLORIDE 0.9 % IV BOLUS (SEPSIS)
500.0000 mL | INTRAVENOUS | Status: AC
  Administered 2015-07-09: 500 mL via INTRAVENOUS

## 2015-07-09 MED ORDER — SODIUM CHLORIDE 0.9 % IV SOLN: INTRAVENOUS | Status: DC

## 2015-07-09 MED ORDER — GABAPENTIN 300 MG PO CAPS
300.0000 mg | ORAL_CAPSULE | Freq: Three times a day (TID) | ORAL | Status: DC
  Administered 2015-07-09 – 2015-07-12 (×10): 300 mg via ORAL
  Filled 2015-07-09 (×10): qty 1

## 2015-07-09 MED ORDER — CLOPIDOGREL BISULFATE 75 MG PO TABS
75.0000 mg | ORAL_TABLET | Freq: Every day | ORAL | Status: DC
  Administered 2015-07-09 – 2015-07-12 (×4): 75 mg via ORAL
  Filled 2015-07-09 (×4): qty 1

## 2015-07-09 MED ORDER — HEPARIN SODIUM (PORCINE) 5000 UNIT/ML IJ SOLN
5000.0000 [IU] | Freq: Three times a day (TID) | INTRAMUSCULAR | Status: DC
  Administered 2015-07-09 – 2015-07-12 (×9): 5000 [IU] via SUBCUTANEOUS
  Filled 2015-07-09 (×9): qty 1

## 2015-07-09 MED ORDER — SODIUM CHLORIDE 0.9 % IV SOLN: INTRAVENOUS | Status: AC

## 2015-07-09 MED ORDER — SIMVASTATIN 20 MG PO TABS
20.0000 mg | ORAL_TABLET | Freq: Every day | ORAL | Status: DC
  Administered 2015-07-09 – 2015-07-11 (×3): 20 mg via ORAL
  Filled 2015-07-09 (×3): qty 1

## 2015-07-09 MED ORDER — SODIUM CHLORIDE 0.9 % IJ SOLN
3.0000 mL | Freq: Two times a day (BID) | INTRAMUSCULAR | Status: DC
  Administered 2015-07-09 – 2015-07-12 (×6): 3 mL via INTRAVENOUS

## 2015-07-09 MED ORDER — MORPHINE SULFATE (PF) 4 MG/ML IV SOLN
4.0000 mg | Freq: Once | INTRAVENOUS | Status: AC
  Administered 2015-07-09: 4 mg via INTRAVENOUS
  Filled 2015-07-09: qty 1

## 2015-07-09 MED ORDER — CETYLPYRIDINIUM CHLORIDE 0.05 % MT LIQD
7.0000 mL | Freq: Two times a day (BID) | OROMUCOSAL | Status: DC
Start: 2015-07-09 — End: 2015-07-10

## 2015-07-09 NOTE — H&P (Signed)
[facility] History and Physical  KAELEN MOAK [id number] DOB: 1945-09-13 DOA: 07/09/2015  Referring physician: Dr Leonides Schanz - [facility] PCP: No primary care provider on file.   Chief Complaint: CP and Chills  HPI: COURVOISIER VREDEVELD is a 69 y.o. male  Acute onset chest pain and chills at 01:30 on 07/09/2015. States that the chest pain felt like someone was sitting on his chest. Temperatures taken at home and was 99 nothing makes symptoms better. Nothing makes symptoms worse. Intermittent sensation of shortness of breath. A catheterization and stent placement 5 years ago area states that for the last 2 weeks he's been coughing up increasingly copious amounts of phlegm. One day ago phlegm production and URI symptoms became significantly worse.   Review of Systems:  Constitutional:  No weight loss, fatigue.  HEENT:  No headaches, Difficulty swallowing,Tooth/dental problems, Cardio-vascular: No Orthopnea, PND, swelling in lower extremities, anasarca, dizziness, palpitations  GI:  No heartburn, indigestion, abdominal pain, nausea, vomiting, diarrhea, change in bowel habits, Resp:   Per HPI Skin:  no rash or lesions.  GU:  no dysuria, change in color of urine, no urgency or frequency. No flank pain.  Musculoskeletal:   No joint pain or swelling. No decreased range of motion. No back pain.  Psych:  No change in mood or affect. No depression or anxiety. No memory loss.  Neuro:  No change in sensation, unilateral strength, or cognitive abilities  All other systems were reviewed and are negative.  Past Medical History  Diagnosis Date  . Hypertension   . Hypercholesteremia   . GERD (gastroesophageal reflux disease)   . Diabetes mellitus without complication ([place])   . Neuropathy ([place])   . Peripheral vascular disease [facility])    Past Surgical History  Procedure Laterality Date  . Cardiac catheterization  2003    with stent  . Femoral-popliteal bypass graft    . Colonoscopy N/A 09/22/2013     Procedure: COLONOSCOPY;  Surgeon: Daneil Dolin, MD;  Location: [facility];  Service: Endoscopy;  Laterality: N/A;  9:30 AM   Social History:  reports that he has quit smoking. His smoking use included Cigarettes. He has a 60 pack-year smoking history. He does not have any smokeless tobacco history on file. He reports that he does not drink alcohol or use illicit drugs.  No Known Allergies  Family History  Problem Relation Age of Onset  . Colon cancer Neg Hx      Prior to Admission medications   Medication Sig Start Date End Date Taking? Authorizing Provider  amLODipine (NORVASC) 10 MG tablet Take 10 mg by mouth daily.   Yes Historical Provider, MD  aspirin 81 MG tablet Take 81 mg by mouth daily.   Yes Historical Provider, MD  cetirizine (ZYRTEC) 10 MG tablet Take 10 mg by mouth daily.   Yes Historical Provider, MD  Cholecalciferol (VITAMIN D) 2000 UNITS tablet Take 2,000 Units by mouth daily.   Yes Historical Provider, MD  clopidogrel (PLAVIX) 75 MG tablet Take 75 mg by mouth daily with breakfast.   Yes Historical Provider, MD  gabapentin (NEURONTIN) 300 MG capsule Take 300 mg by mouth 3 (three) times daily.    Yes Historical Provider, MD  glipiZIDE (GLUCOTROL) 5 MG tablet Take 10 mg by mouth 2 (two) times daily before a meal.    Yes Historical Provider, MD  hydrochlorothiazide (HYDRODIURIL) 25 MG tablet Take 25 mg by mouth daily.   Yes Historical Provider, MD  lisinopril (PRINIVIL,ZESTRIL) 40 MG  tablet Take 40 mg by mouth daily.   Yes Historical Provider, MD  metFORMIN (GLUCOPHAGE) 1000 MG tablet Take 1,000 mg by mouth 2 (two) times daily with a meal.   Yes Historical Provider, MD  pantoprazole (PROTONIX) 40 MG tablet Take 40 mg by mouth daily.   Yes Historical Provider, MD  simvastatin (ZOCOR) 40 MG tablet Take 20 mg by mouth at bedtime.    Yes Historical Provider, MD   Physical Exam: Filed Vitals:   07/09/15 0400 07/09/15 0430 07/09/15 0444 07/09/15 0500  BP: 125/61 103/46   117/61  Pulse: 135 132  130  Temp:   99.1 F (37.3 C)   TempSrc:   Oral   Resp: 21 16  15   Height:      Weight:      SpO2: 95% 92%  90%    Wt Readings from Last 3 Encounters:  07/09/15 72.576 kg (160 lb)  03/15/15 71.668 kg (158 lb)  09/22/13 70.308 kg (155 lb)    General: Appears ill but comfortable Eyes:  PERRL, EOMI, normal lids, iris ENT: Dry mm Neck:  no LAD, masses or thyromegaly Cardiovascular:  RRR, II/VI systolic murmur. No LE edema. 2+ peripheral pulses Respiratory: Diffuse crackles w/ diminished breath sounds at the bases. Increased effort.  Abdomen:  soft, ntnd Skin:  no rash or induration seen on limited exam Musculoskeletal:  grossly normal tone BUE/BLE Psychiatric:  grossly normal mood and affect, speech fluent and appropriate Neurologic:  CN 2-12 grossly intact, moves all extremities in coordinated fashion.          Labs on Admission:  Basic Metabolic Panel:  Recent Labs Lab 07/09/15 0300  NA 134*  K 4.8  CL 102  CO2 19*  GLUCOSE 167*  BUN 29*  CREATININE 1.62*  CALCIUM 9.4   Liver Function Tests:  Recent Labs Lab 07/09/15 0300  AST 21  ALT 13*  ALKPHOS 60  BILITOT 0.6  PROT 7.3  ALBUMIN 3.7   No results for input(s): LIPASE, AMYLASE in the last 168 hours. No results for input(s): AMMONIA in the last 168 hours. CBC:  Recent Labs Lab 07/09/15 0300  WBC 18.2*  NEUTROABS 15.4*  HGB 11.4*  HCT 34.1*  MCV 82.4  PLT 501*   Cardiac Enzymes:  Recent Labs Lab 07/09/15 0300  TROPONINI <0.03    BNP (last 3 results) No results for input(s): BNP in the last 8760 hours.  ProBNP (last 3 results) No results for input(s): PROBNP in the last 8760 hours.   CREATININE: 1.62 mg/dL ABNORMAL (07/09/15 0300) Estimated creatinine clearance - 42.8 mL/min  CBG:  Recent Labs Lab 07/09/15 0554  GLUCAP 183*    Radiological Exams on Admission: Dg Chest 2 View  07/09/2015  CLINICAL DATA:  Acute onset of mid sternal chest pain and  jaw pain. Initial encounter. EXAM: CHEST  2 VIEW COMPARISON:  Chest radiograph performed 03/15/2015 FINDINGS: The lungs are well-aerated. Patchy bilateral airspace opacities raise concern for multifocal pneumonia, though mild interstitial edema might have a similar appearance. No definite pleural effusion or pneumothorax is seen. The heart is mildly enlarged. No acute osseous abnormalities are seen. IMPRESSION: Patchy bilateral airspace opacities raise concern for multifocal pneumonia, though mild interstitial edema might have a similar appearance. Mild cardiomegaly noted. Electronically Signed   By: Garald Balding M.D.   On: 07/09/2015 04:17      Assessment/Plan Principal Problem:   Sepsis ([place]) Active Problems:   CAP (community acquired pneumonia)   CAD (coronary artery  disease)   PVD (peripheral vascular disease) ([place])   Essential hypertension   Hypotension   Neuropathy, peripheral (HCC)   HLD (hyperlipidemia)   AKI (acute kidney injury) ([place])   Sepsis:  Multifocal pneumonia on chest x-ray, febrile, hypotensive, WBC 18.2 with left shift, lactic acid 2.6, AKI, . Currently no O2 requirement - Stepdown (patient requiring [facility] as there are no ICU or stepdown beds [facility]). Patient stable for transfer, preserving airway, no need for intubation at this time. - ABG - Vanc and Zosyn - IVF bolus/maintenance  - f/u BCX  CP/CAD: likely secondary to CAP. Trop neg. EKG w/o ACS. H/o CAD s/p cath w/ stent placement.  - Tele - Cycle Trop - cont ASA, plavix  AKI vs CKD: Cr 1.62. Baseline 0.9 (2011 and previous) though most previous check 1.57 on 03/2015. Pt has not had Cr worked up since previous ED visit. Pt is severe vascularpath and may warrant further investigation (renal artery [place]) if not improving after IVF - IVF - BMET in am - consider renal artery [place] if not improving.   PVD: severe PVD. Followed by [facility]. Multiple LE stents placed in past. Distal pulses intact.   - ASA and plavix as above  HTN: hypotensive from sepsis - hold home Norvasc, lisinopril, HCTZ  Neuropathic pain: - continue neurontin  GERD: - continue ppi (IV)  HLD: - continue Statin  Code Status: FULL  DVT Prophylaxis: Hep Family Communication: Wife Disposition Plan: Pending Improvement    Tenise Stetler J, MD Family Medicine [facility] [email or web address] Password [email or web address]

## 2015-07-09 NOTE — Progress Notes (Signed)
ANTIBIOTIC CONSULT NOTE - INITIAL  Pharmacy Consult:  Vancomycin Indication:  Sepsis + PNA  No Known Allergies  Patient Measurements: Height: 5\' 8"  (172.7 cm) Weight: 160 lb (72.576 kg) IBW/kg (Calculated) : 68.4  Vital Signs: Temp: 99.1 F (37.3 C) (11/25 0444) Temp Source: Oral (11/25 0444) BP: 107/64 mmHg (11/25 0600) Pulse Rate: 123 (11/25 0600) Intake/Output from previous day: 11/24 0701 - 11/25 0700 In: 3000 [I.V.:3000] Out: 500 [Urine:500] Intake/Output from this shift: Total I/O In: -  Out: 300 [Urine:300]  Labs:  Recent Labs  07/09/15 0300  WBC 18.2*  HGB 11.4*  PLT 501*  CREATININE 1.62*   Estimated Creatinine Clearance: 42.8 mL/min (by C-G formula based on Cr of 1.62). No results for input(s): VANCOTROUGH, VANCOPEAK, VANCORANDOM, GENTTROUGH, GENTPEAK, GENTRANDOM, TOBRATROUGH, TOBRAPEAK, TOBRARND, AMIKACINPEAK, AMIKACINTROU, AMIKACIN in the last 72 hours.   Microbiology: Recent Results (from the past 720 hour(s))  Blood Culture (routine x 2)     Status: None (Preliminary result)   Collection Time: 07/09/15  3:13 AM  Result Value Ref Range Status   Specimen Description BLOOD RIGHT FOREARM  Final   Special Requests BOTTLES DRAWN AEROBIC AND ANAEROBIC 8CC EACH  Final   Culture NO GROWTH <12 HOURS  Final   Report Status PENDING  Incomplete  Blood Culture (routine x 2)     Status: None (Preliminary result)   Collection Time: 07/09/15  3:25 AM  Result Value Ref Range Status   Specimen Description BLOOD LEFT ANTECUBITAL  Final   Special Requests BOTTLES DRAWN AEROBIC AND ANAEROBIC 10CC EACH  Final   Culture PENDING  Incomplete   Report Status PENDING  Incomplete    Medical History: Past Medical History  Diagnosis Date  . Hypertension   . Hypercholesteremia   . GERD (gastroesophageal reflux disease)   . Diabetes mellitus without complication ([place])   . Neuropathy ([place])   . Peripheral vascular disease ([place])       Assessment: 14 YOM presented to  [facility] with chest and chills, found to have multifocal PNA on CXR.  Patient transferred to [facility] for further care.  He was given vancomycin 1gm and Zosyn 3.375g IV prior to transfer.  Pharmacy consulted to continue vancomycin, and Zosyn was switched to [facility] per MD.  Patient has AKI.  Vanc 11/25 >> Tressie Ellis 11/25 >>  11/25 Influenza A/B/H1N1 - negative 11/25 BCx x2 - NGTD 11/25 UCx -    Goal of Therapy:  Vancomycin trough level 15-20 mcg/ml   Plan:  - Vanc 750mg  IV Q12H, start this afternoon - Fortaz 1gm IV Q12H per MD - Monitor renal fxn, clinical progress, vanc trough as indicated - F/U med history - CMP in AM    Shanaye Rief D. Mina Marble, PharmD, BCPS Pager:  [phone] 07/09/2015, 8:25 AM

## 2015-07-09 NOTE — ED Notes (Signed)
Pt reported he had mid sternal chest pain this am approx 0130, states he is pain free at this time.

## 2015-07-09 NOTE — ED Notes (Signed)
Pt states his jaw is hurting. EDP notified

## 2015-07-09 NOTE — Care Management Note (Signed)
Case Management Note  Patient Details  Name: [name] MRN: [id number] Date of Birth: 02/06/1946  Subjective/Objective:                    Action/Plan: Patient was admitted with sepsis due to pneumonia. Lives at home with wife. Will follow for discharge needs.  Expected Discharge Date:                  Expected Discharge Plan:     In-House Referral:     Discharge planning Services     Post Acute Care Choice:    Choice offered to:     DME Arranged:    DME Agency:     HH Arranged:    HH Agency:     Status of Service:  In process, will continue to follow  Medicare Important Message Given:    Date Medicare IM Given:    Medicare IM give by:    Date Additional Medicare IM Given:    Additional Medicare Important Message give by:     If discussed at [place] of Stay Meetings, dates discussed:    Additional Comments:  Rolm Baptise, RN 07/09/2015, 11:47 AM

## 2015-07-09 NOTE — Progress Notes (Signed)
[facility] PROGRESS NOTE    Progress Note   [name] [id number] DOB: [birth year] DOA: 07/09/2015 PCP: No primary care provider on file.   Brief Narrative:   [name] is an 69 y.o. male admitted for sepsis and PNA.  Assessment/Plan:  Sepsis ([place]) due to CAP (community acquired pneumonia) - started empirically on Vanc and fortaz on 11.25.2016. hypotensive on admission which responded to fluid resucitation. - trending lactic acid which is elevated. - he is about 2.5 L positive. BCx pending. - cont IV fluid hydration and strict I and O's  AKI (acute kidney injury) ([place]): - baseline Cr 1.5, before less 1.0. - concern about pre-renal etiology vs renal artery stenosis. - repeat b-met in am.  CAD (coronary artery disease)/  PVD (peripheral vascular disease) ([place]): - cont ASA and plavis, EKG no ST segment changes Cardiac Biomakers negative x1.  Essential hypertension: Hold antihypertensive medications.  Neuropathy, peripheral (HCC) Cont neurontin.  HLD (hyperlipidemia) Cont statins    DVT Prophylaxis - Lovenox ordered.  Family Communication: wife Disposition Plan: Home 3-4 days Code Status:     Code Status Orders        Start     Ordered   07/09/15 [phone]  Full code   Continuous     07/09/15 0600        IV Access:    Peripheral IV   Procedures and diagnostic studies:   Dg Chest 2 View  07/09/2015  CLINICAL DATA:  Acute onset of mid sternal chest pain and jaw pain. Initial encounter. EXAM: CHEST  2 VIEW COMPARISON:  Chest radiograph performed 03/15/2015 FINDINGS: The lungs are well-aerated. Patchy bilateral airspace opacities raise concern for multifocal pneumonia, though mild interstitial edema might have a similar appearance. No definite pleural effusion or pneumothorax is seen. The heart is mildly enlarged. No acute osseous abnormalities are seen. IMPRESSION: Patchy bilateral airspace opacities raise concern for multifocal pneumonia, though  mild interstitial edema might have a similar appearance. Mild cardiomegaly noted. Electronically Signed   By: [name] M.D.   On: 07/09/2015 04:17     Medical Consultants:    None.  Anti-Infectives:   Anti-infectives    Start     Dose/Rate Route Frequency Ordered Stop   07/09/15 0315  piperacillin-tazobactam (ZOSYN) IVPB 3.375 g     3.375 g 100 mL/hr over 30 Minutes Intravenous  Once 07/09/15 0309 07/09/15 0411   07/09/15 0315  vancomycin (VANCOCIN) IVPB 1000 mg/200 mL premix     1,000 mg 200 mL/hr over 60 Minutes Intravenous  Once 07/09/15 0309 07/09/15 7482      Subjective:    [name] relates he still feels SOB and pleuritic CP.  Objective:    Filed Vitals:   07/09/15 0430 07/09/15 0444 07/09/15 0500 07/09/15 0600  BP: 103/46  117/61 107/64  Pulse: 132  130 123  Temp:  99.1 F (37.3 C)    TempSrc:  Oral    Resp: '16  15 18  ' Height:      Weight:      SpO2: 92%  90% 91%    Intake/Output Summary (Last 24 hours) at 07/09/15 0743 Last data filed at 07/09/15 0622  Gross per 24 hour  Intake   3000 ml  Output    500 ml  Net   2500 ml   Filed Weights   07/09/15 0255  Weight: 72.576 kg (160 lb)    Exam: Gen:  NAD Cardiovascular:  RRR, No M/R/G Chest and  lungs:   CTAB Abdomen:  Abdomen soft, NT/ND, + BS Extremities:  No C/E/C   Data Reviewed:    Labs: Basic Metabolic Panel:  Recent Labs Lab 07/09/15 0300  NA 134*  K 4.8  CL 102  CO2 19*  GLUCOSE 167*  BUN 29*  CREATININE 1.62*  CALCIUM 9.4   GFR Estimated Creatinine Clearance: 42.8 mL/min (by C-G formula based on Cr of 1.62). Liver Function Tests:  Recent Labs Lab 07/09/15 0300  AST 21  ALT 13*  ALKPHOS 60  BILITOT 0.6  PROT 7.3  ALBUMIN 3.7   No results for input(s): LIPASE, AMYLASE in the last 168 hours. No results for input(s): AMMONIA in the last 168 hours. Coagulation profile No results for input(s): INR, PROTIME in the last 168 hours.  CBC:  Recent Labs Lab  07/09/15 0300  WBC 18.2*  NEUTROABS 15.4*  HGB 11.4*  HCT 34.1*  MCV 82.4  PLT 501*   Cardiac Enzymes:  Recent Labs Lab 07/09/15 0300  TROPONINI <0.03  <0.03   BNP (last 3 results) No results for input(s): PROBNP in the last 8760 hours. CBG:  Recent Labs Lab 07/09/15 0554  GLUCAP 183*   D-Dimer: No results for input(s): DDIMER in the last 72 hours. Hgb A1c: No results for input(s): HGBA1C in the last 72 hours. Lipid Profile: No results for input(s): CHOL, HDL, LDLCALC, TRIG, CHOLHDL, LDLDIRECT in the last 72 hours. Thyroid function studies:  Recent Labs  07/09/15 0300  TSH 0.967   Anemia work up: No results for input(s): VITAMINB12, FOLATE, FERRITIN, TIBC, IRON, RETICCTPCT in the last 72 hours. Sepsis Labs:  Recent Labs Lab 07/09/15 0300  PROCALCITON <0.10  WBC 18.2*  LATICACIDVEN 2.6*   Microbiology Recent Results (from the past 240 hour(s))  Blood Culture (routine x 2)     Status: None (Preliminary result)   Collection Time: 07/09/15  3:13 AM  Result Value Ref Range Status   Specimen Description BLOOD RIGHT FOREARM  Final   Special Requests BOTTLES DRAWN AEROBIC AND ANAEROBIC 8CC EACH  Final   Culture NO GROWTH <12 HOURS  Final   Report Status PENDING  Incomplete  Blood Culture (routine x 2)     Status: None (Preliminary result)   Collection Time: 07/09/15  3:25 AM  Result Value Ref Range Status   Specimen Description BLOOD LEFT ANTECUBITAL  Final   Special Requests BOTTLES DRAWN AEROBIC AND ANAEROBIC 10CC EACH  Final   Culture PENDING  Incomplete   Report Status PENDING  Incomplete     Medications:   . sodium chloride   Intravenous STAT  . aspirin  81 mg Oral Daily  . clopidogrel  75 mg Oral Q breakfast  . gabapentin  300 mg Oral TID  . guaiFENesin  1,200 mg Oral BID  . heparin  5,000 Units Subcutaneous 3 times per day  . insulin aspart  0-5 Units Subcutaneous QHS  . insulin aspart  0-9 Units Subcutaneous TID WC  . simvastatin  20 mg  Oral QHS  . sodium chloride  3 mL Intravenous Q12H   Continuous Infusions: . sodium chloride 1,000 mL (07/09/15 0436)  . sodium chloride      Time spent: 25 min   LOS: 0 days   [name]  [facility] Pager [phone]  *Please refer to [email or web address], password [email or web address] to get updated schedule on who will round on this patient, as hospitalists switch teams weekly. If 7PM-7AM, please contact night-coverage at [email or web address], password [email or web address] for  any overnight needs.  07/09/2015, 7:43 AM

## 2015-07-09 NOTE — ED Provider Notes (Addendum)
TIME SEEN: 3:00 AM  CHIEF COMPLAINT: chest pain, chills  HPI: Pt is a 69 y.o. male with history of hypertension, diabetes, hyperlipidemia, CAD status post cardiac stents, peripheral vascular disease who presents to the emergency department with chest pain that he reports feels like someone sitting on his chest that started at 1:30 AM resolved prior to arrival. States that pain started when he was in bed "trying to get warm". His significant other at bedside states today he has had the chills and had a low-grade temperature at home of 99. He has had a mild dry cough. No shortness of breath currently but did have shortness of breath with his chest pain. No nausea, vomiting or diarrhea. No abdominal pain. No headache, neck pain or neck stiffness. No ear pain, sore throat. No rash. Denies dysuria, hematuria. No known sick contacts or recent travel. Reports his last cardiac catheterization was over 5 years ago.  PCP and cardiologist at the [facility]  ROS: See HPI Constitutional:  fever  Eyes: no drainage  ENT: no runny nose   Cardiovascular:   chest pain  Resp:  SOB  GI: no vomiting GU: no dysuria Integumentary: no rash  Allergy: no hives  Musculoskeletal: no leg swelling  Neurological: no slurred speech ROS otherwise negative  PAST MEDICAL HISTORY/PAST SURGICAL HISTORY:  Past Medical History  Diagnosis Date  . Hypertension   . Hypercholesteremia   . GERD (gastroesophageal reflux disease)   . Diabetes mellitus without complication ([place])   . Neuropathy ([place])   . Peripheral vascular disease ([place])     MEDICATIONS:  Prior to Admission medications   Medication Sig Start Date End Date Taking? Authorizing Provider  amLODipine (NORVASC) 10 MG tablet Take 10 mg by mouth daily.   Yes Historical Provider, MD  aspirin 81 MG tablet Take 81 mg by mouth daily.   Yes Historical Provider, MD  cetirizine (ZYRTEC) 10 MG tablet Take 10 mg by mouth daily.   Yes Historical Provider, MD  Cholecalciferol (VITAMIN  D) 2000 UNITS tablet Take 2,000 Units by mouth daily.   Yes Historical Provider, MD  clopidogrel (PLAVIX) 75 MG tablet Take 75 mg by mouth daily with breakfast.   Yes Historical Provider, MD  gabapentin (NEURONTIN) 300 MG capsule Take 300 mg by mouth 3 (three) times daily.    Yes Historical Provider, MD  glipiZIDE (GLUCOTROL) 5 MG tablet Take 10 mg by mouth 2 (two) times daily before a meal.    Yes Historical Provider, MD  hydrochlorothiazide (HYDRODIURIL) 25 MG tablet Take 25 mg by mouth daily.   Yes Historical Provider, MD  lisinopril (PRINIVIL,ZESTRIL) 40 MG tablet Take 40 mg by mouth daily.   Yes Historical Provider, MD  metFORMIN (GLUCOPHAGE) 1000 MG tablet Take 1,000 mg by mouth 2 (two) times daily with a meal.   Yes Historical Provider, MD  pantoprazole (PROTONIX) 40 MG tablet Take 40 mg by mouth daily.   Yes Historical Provider, MD  simvastatin (ZOCOR) 40 MG tablet Take 20 mg by mouth at bedtime.    Yes Historical Provider, MD    ALLERGIES:  No Known Allergies  SOCIAL HISTORY:  Social History  Substance Use Topics  . Smoking status: Former Smoker -- 2.00 packs/day for 30 years    Types: Cigarettes  . Smokeless tobacco: Not on file  . Alcohol Use: No    FAMILY HISTORY: Family History  Problem Relation Age of Onset  . Colon cancer Neg Hx     EXAM: BP 143/83 mmHg  Pulse 145  Temp(Src) 103.8 F (39.9 C) (Rectal)  Resp 22  Ht 5\' 8"  (1.727 m)  Wt 160 lb (72.576 kg)  BMI 24.33 kg/m2  SpO2 96% CONSTITUTIONAL: Alert and oriented and responds appropriately to questions. Chronically ill-appearing, has active rigors HEAD: Normocephalic EYES: Conjunctivae clear, PERRL ENT: normal nose; no rhinorrhea; moist mucous membranes; pharynx without lesions noted, no tonsillar hypertrophy or exudate NECK: Supple, no meningismus, no LAD; no nuchal rigidity CARD: Regular and tachycardic; S1 and S2 appreciated; no murmurs, no clicks, no rubs, no gallops RESP: Normal chest excursion without  splinting or tachypnea; breath sounds equal bilaterally; mild crackles diffusely, no wheezes, no rhonchi, no rales, no hypoxia or respiratory distress, speaking full sentences ABD/GI: Normal bowel sounds; non-distended; soft, minimally tender over a reducible umbilical hernia, no rebound, no guarding, no peritoneal signs BACK:  The back appears normal and is non-tender to palpation, there is no CVA tenderness EXT: Normal ROM in all joints; non-tender to palpation; no edema; normal capillary refill; no cyanosis, no calf tenderness or swelling    SKIN: Normal color for age and race; warm, no rash or signs of cellulitis NEURO: Moves all extremities equally, sensation to light touch intact diffusely, cranial nerves II through XII intact PSYCH: The patient's mood and manner are appropriate. Grooming and personal hygiene are appropriate.  MEDICAL DECISION MAKING: Patient here with fever with unknown source. Fevers causing patient to be tachycardic which may be contributing to his chest pain. He is currently chest pain-free. Family deny any recent travel or sick contacts. No recent surgery, instrumentation, IV placement. Patient meets sepsis criteria. Code sepsis initiated. We'll give IV fluids, broad-spectrum antibiotics, obtain labs, chest x-ray, urine, cultures, lactate. Anticipate admission.  ED PROGRESS: Patient's lactate is elevated at 2.6. His leukocytosis of 18,000 with left shift. Urine shows many bacteria and trace hematuria but no other sign of infection. Chest x-ray shows patchy bilateral airspace opacities concerning for multifocal pneumonia. Patient has received broad-spectrum antibiotics. Vital signs show improvement and a heart rate as temperature is coming down. He is still normotensive. Will discuss with hospitalist for admission for community-acquired pneumonia, sepsis.  5:00 AM  Pt has had some mild hypoxia with sats 88-89%. Sats 96% on 2-3 L nasal cannula. Discussed with Dr. Barbaraann Faster with  hospitalist service. We agree on admission to a stepdown bed. At this time [facility] hospital has no beds available or appropriate staffing. He will need to be transferred to [facility]. Patient and family at bedside are comfortable with this plan. Accepting physician is Dr. Hal Hope.   Sepsis - Repeat Assessment  Performed at:    5:00 AM  Vitals     Blood pressure 103/46, pulse 132, temperature 99.1 F (37.3 C), temperature source Oral, resp. rate 16, height 5\' 8"  (1.727 m), weight 160 lb (72.576 kg), SpO2 92 %.  Heart:     Regular rate and rhythm  Lungs:    Crackles diffusely  Capillary Refill:   <2 sec  Peripheral Pulse:   Radial pulse palpable  Skin:     Normal Color  6:25 AM  Pt stable. Heart rate slowly improving. [facility] at bedside to transfer patient to [facility].    EKG Interpretation  Date/Time:  Friday July 09 2015 02:54:46 EST Ventricular Rate:  153 PR Interval:    QRS Duration: 108 QT Interval:  323 QTC Calculation: 515 R Axis:   72 Text Interpretation:  Sinus tachycardia Prolonged QT interval Confirmed by Tykel Badie,  DO, Werner Labella [id number])  on 07/09/2015 3:17:38 AM        CRITICAL CARE Performed by: Nyra Jabs   Total critical care time: 45 minutes  Critical care time was exclusive of separately billable procedures and treating other patients.  Critical care was necessary to treat or prevent imminent or life-threatening deterioration.  Critical care was time spent personally by me on the following activities: development of treatment plan with patient and/or surrogate as well as nursing, discussions with consultants, evaluation of patient's response to treatment, examination of patient, obtaining history from patient or surrogate, ordering and performing treatments and interventions, ordering and review of laboratory studies, ordering and review of radiographic studies, pulse oximetry and re-evaluation of patient's condition.    [place], DO 07/09/15 [place], DO 07/09/15 [phone]

## 2015-07-10 DIAGNOSIS — A419 Sepsis, unspecified organism: Principal | ICD-10-CM

## 2015-07-10 DIAGNOSIS — I739 Peripheral vascular disease, unspecified: Secondary | ICD-10-CM

## 2015-07-10 DIAGNOSIS — J189 Pneumonia, unspecified organism: Secondary | ICD-10-CM

## 2015-07-10 DIAGNOSIS — N179 Acute kidney failure, unspecified: Secondary | ICD-10-CM

## 2015-07-10 DIAGNOSIS — I10 Essential (primary) hypertension: Secondary | ICD-10-CM

## 2015-07-10 LAB — URINE CULTURE: Culture: 7000

## 2015-07-10 LAB — CBC
HEMATOCRIT: 31.6 % — AB (ref 39.0–52.0)
HEMOGLOBIN: 10.3 g/dL — AB (ref 13.0–17.0)
MCH: 27 pg (ref 26.0–34.0)
MCHC: 32.6 g/dL (ref 30.0–36.0)
MCV: 82.9 fL (ref 78.0–100.0)
Platelets: 396 10*3/uL (ref 150–400)
RBC: 3.81 MIL/uL — AB (ref 4.22–5.81)
RDW: 16.2 % — ABNORMAL HIGH (ref 11.5–15.5)
WBC: 12 10*3/uL — AB (ref 4.0–10.5)

## 2015-07-10 LAB — COMPREHENSIVE METABOLIC PANEL
ALBUMIN: 3.2 g/dL — AB (ref 3.5–5.0)
ALK PHOS: 54 U/L (ref 38–126)
ALT: 12 U/L — AB (ref 17–63)
AST: 16 U/L (ref 15–41)
Anion gap: 9 (ref 5–15)
BILIRUBIN TOTAL: 0.7 mg/dL (ref 0.3–1.2)
BUN: 12 mg/dL (ref 6–20)
CALCIUM: 8.8 mg/dL — AB (ref 8.9–10.3)
CO2: 20 mmol/L — AB (ref 22–32)
Chloride: 102 mmol/L (ref 101–111)
Creatinine, Ser: 1.2 mg/dL (ref 0.61–1.24)
GFR calc Af Amer: >60 mL/min (ref >60)
GFR calc non Af Amer: >60 mL/min (ref >60)
GLUCOSE: 150 mg/dL — AB (ref 65–99)
Potassium: 4.4 mmol/L (ref 3.5–5.1)
SODIUM: 131 mmol/L — AB (ref 135–145)
TOTAL PROTEIN: 6.3 g/dL — AB (ref 6.5–8.1)

## 2015-07-10 LAB — GLUCOSE, CAPILLARY
GLUCOSE-CAPILLARY: 164 mg/dL — AB (ref 65–99)
GLUCOSE-CAPILLARY: 233 mg/dL — AB (ref 65–99)
GLUCOSE-CAPILLARY: 176 mg/dL — AB (ref 65–99)
GLUCOSE-CAPILLARY: 221 mg/dL — AB (ref 65–99)

## 2015-07-10 MED ORDER — CEFTAZIDIME 1 G IJ SOLR
1.0000 g | Freq: Three times a day (TID) | INTRAMUSCULAR | Status: DC
  Administered 2015-07-10 – 2015-07-11 (×3): 1 g via INTRAVENOUS
  Filled 2015-07-10 (×5): qty 1

## 2015-07-10 MED ORDER — VITAMIN D 1000 UNITS PO TABS
2000.0000 [IU] | ORAL_TABLET | Freq: Every day | ORAL | Status: DC
Start: 2015-07-10 — End: 2015-07-12
  Administered 2015-07-10: 1000 [IU] via ORAL
  Administered 2015-07-11 – 2015-07-12 (×2): 2000 [IU] via ORAL
  Filled 2015-07-10 (×3): qty 2

## 2015-07-10 MED ORDER — VANCOMYCIN HCL IN DEXTROSE 1-5 GM/200ML-% IV SOLN
1000.0000 mg | Freq: Two times a day (BID) | INTRAVENOUS | Status: DC
  Administered 2015-07-10 – 2015-07-11 (×2): 1000 mg via INTRAVENOUS
  Filled 2015-07-10 (×4): qty 200

## 2015-07-10 MED ORDER — FERROUS SULFATE 325 (65 FE) MG PO TABS: 325.0000 mg | ORAL_TABLET | Freq: Two times a day (BID) | ORAL | Status: DC

## 2015-07-10 MED ORDER — FERROUS SULFATE 325 (65 FE) MG PO TABS
325.0000 mg | ORAL_TABLET | Freq: Every day | ORAL | Status: DC
  Administered 2015-07-10 – 2015-07-11 (×2): 325 mg via ORAL
  Filled 2015-07-10: qty 1

## 2015-07-10 MED ORDER — FERROUS SULFATE 325 (65 FE) MG PO TABS
325.0000 mg | ORAL_TABLET | Freq: Two times a day (BID) | ORAL | Status: DC
  Administered 2015-07-10: 325 mg via ORAL
  Filled 2015-07-10: qty 1

## 2015-07-10 MED ORDER — FERROUS SULFATE 325 (65 FE) MG PO TABS
650.0000 mg | ORAL_TABLET | Freq: Every day | ORAL | Status: DC
  Administered 2015-07-11 – 2015-07-12 (×2): 650 mg via ORAL
  Filled 2015-07-10 (×2): qty 2

## 2015-07-10 MED ORDER — SODIUM CHLORIDE 0.9 % IV SOLN
1000.0000 mL | INTRAVENOUS | Status: DC
  Administered 2015-07-11: 1000 mL via INTRAVENOUS

## 2015-07-10 MED ORDER — PANTOPRAZOLE SODIUM 40 MG PO TBEC
40.0000 mg | DELAYED_RELEASE_TABLET | Freq: Every day | ORAL | Status: DC
  Administered 2015-07-10 – 2015-07-12 (×3): 40 mg via ORAL
  Filled 2015-07-10 (×2): qty 1

## 2015-07-10 NOTE — Progress Notes (Signed)
ANTIBIOTIC CONSULT NOTE - FOLLOW UP  Pharmacy Consult:  Vancomycin Indication:  Sepsis + PNA  No Known Allergies  Patient Measurements: Height: 5\' 8"  (172.7 cm) Weight: 160 lb (72.576 kg) IBW/kg (Calculated) : 68.4  Vital Signs: Temp: 97.9 F (36.6 C) (11/26 0806) Temp Source: Oral (11/26 0806) BP: 136/76 mmHg (11/26 0806) Pulse Rate: 98 (11/26 0806) Intake/Output from previous day: 11/25 0701 - 11/26 0700 In: 926.7 [I.V.:526.7; IV Piggyback:400] Out: 3127 [id number] Intake/Output from this shift: Total I/O In: 93.8 [I.V.:43.8; IV Piggyback:50] Out: 275 [Urine:275]  Labs:  Recent Labs  07/09/15 0300 07/10/15 0230  WBC 18.2* 12.0*  HGB 11.4* 10.3*  PLT 501* 396  CREATININE 1.62* 1.20   Estimated Creatinine Clearance: 57.8 mL/min (by C-G formula based on Cr of 1.2). No results for input(s): VANCOTROUGH, VANCOPEAK, VANCORANDOM, GENTTROUGH, GENTPEAK, GENTRANDOM, TOBRATROUGH, TOBRAPEAK, TOBRARND, AMIKACINPEAK, AMIKACINTROU, AMIKACIN in the last 72 hours.   Microbiology: Recent Results (from the past 720 hour(s))  Blood Culture (routine x 2)     Status: None (Preliminary result)   Collection Time: 07/09/15  3:13 AM  Result Value Ref Range Status   Specimen Description BLOOD RIGHT FOREARM  Final   Special Requests BOTTLES DRAWN AEROBIC AND ANAEROBIC 8CC EACH  Final   Culture NO GROWTH <12 HOURS  Final   Report Status PENDING  Incomplete  Blood Culture (routine x 2)     Status: None (Preliminary result)   Collection Time: 07/09/15  3:25 AM  Result Value Ref Range Status   Specimen Description BLOOD LEFT ANTECUBITAL  Final   Special Requests BOTTLES DRAWN AEROBIC AND ANAEROBIC 10CC EACH  Final   Culture PENDING  Incomplete   Report Status PENDING  Incomplete  MRSA PCR Screening     Status: None   Collection Time: 07/09/15  7:11 AM  Result Value Ref Range Status   MRSA by PCR NEGATIVE NEGATIVE Final    Comment:        The GeneXpert MRSA Assay (FDA approved for  NASAL specimens only), is one component of a comprehensive MRSA colonization surveillance program. It is not intended to diagnose MRSA infection nor to guide or monitor treatment for MRSA infections.      Assessment: [name] presented to [facility] with chest and chills, found to have multifocal PNA on CXR.  Patient transferred to [facility] for further care.  Pharmacy managing vancomycin for CAP and sepsis.  Patient is also on [place].  His AKI is resolving and expect further improvement in AM.  Vanc 11/25 >> Tressie Ellis 11/25 >>  11/25 Influenza A/B/H1N1 - negative 11/25 BCx x2 - NGTD 11/25 UCx -    Goal of Therapy:  Vancomycin trough level 15-20 mcg/ml   Plan:  - Change vanc to 1000mg  IV Q12H - Change Fortaz to 1gm IV Q8H - Monitor renal fxn, clinical progress, vanc trough as indicated - F/U resume home meds   Shomari Scicchitano D. Mina Marble, PharmD, BCPS Pager:  [phone] 07/10/2015, 8:52 AM

## 2015-07-10 NOTE — Progress Notes (Addendum)
[facility] PROGRESS NOTE    Progress Note   [name] [id number] DOB: Aug 19, 1945 DOA: 07/09/2015 PCP: No primary care provider on file.   Brief Narrative:   [name] is an 69 y.o. male admitted for sepsis and PNA.  Assessment/Plan:  Sepsis ([place]) due to CAP (community acquired pneumonia) - Started empirically on Vanc and fortaz on 11.25.2016. Hypotension resolved. - afebrile, leukocytosis improving. - Increase IV fluids. BCx pending. - Strict I and O's  AKI (acute kidney injury) ([place]): - baseline Cr 1.5, around 1.0. - concern about pre-renal etiology vs renal artery stenosis. - rCr cont to improve with IV hydration.  CAD (coronary artery disease)/  PVD (peripheral vascular disease) ([place]): - cont ASA and plavis, EKG no ST segment changes Cardiac Biomakers negative x1. - Pain is pleuritic in nature.  Essential hypertension: Hold antihypertensive medications.  Neuropathy, peripheral ([place]): Cont neurontin.  HLD (hyperlipidemia) Cont statins    DVT Prophylaxis - Lovenox ordered.  Family Communication: wife Disposition Plan: Home 2-3 days Code Status:     Code Status Orders        Start     Ordered   07/09/15 [id number]  Full code   Continuous     07/09/15 0600        IV Access:    Peripheral IV   Procedures and diagnostic studies:   Dg Chest 2 View  07/09/2015  CLINICAL DATA:  Acute onset of mid sternal chest pain and jaw pain. Initial encounter. EXAM: CHEST  2 VIEW COMPARISON:  Chest radiograph performed 03/15/2015 FINDINGS: The lungs are well-aerated. Patchy bilateral airspace opacities raise concern for multifocal pneumonia, though mild interstitial edema might have a similar appearance. No definite pleural effusion or pneumothorax is seen. The heart is mildly enlarged. No acute osseous abnormalities are seen. IMPRESSION: Patchy bilateral airspace opacities raise concern for multifocal pneumonia, though mild interstitial edema might have a  similar appearance. Mild cardiomegaly noted. Electronically Signed   By: Garald Balding M.D.   On: 07/09/2015 04:17     Medical Consultants:    None.  Anti-Infectives:   Anti-infectives    Start     Dose/Rate Route Frequency Ordered Stop   07/09/15 1600  vancomycin (VANCOCIN) IVPB 750 mg/150 ml premix     750 mg 150 mL/hr over 60 Minutes Intravenous Every 12 hours 07/09/15 0827     07/09/15 0830  cefTAZidime (FORTAZ) 1 g in dextrose 5 % 50 mL IVPB     1 g 100 mL/hr over 30 Minutes Intravenous Every 12 hours 07/09/15 0746     07/09/15 0315  piperacillin-tazobactam (ZOSYN) IVPB 3.375 g     3.375 g 100 mL/hr over 30 Minutes Intravenous  Once 07/09/15 0309 07/09/15 0411   07/09/15 0315  vancomycin (VANCOCIN) IVPB 1000 mg/200 mL premix     1,000 mg 200 mL/hr over 60 Minutes Intravenous  Once 07/09/15 0309 07/09/15 [id number]      Subjective:    [name] Feels better, pleuritic cp resolved. He is really hungry.  Objective:    Filed Vitals:   07/09/15 2200 07/09/15 2315 07/10/15 0339 07/10/15 0700  BP: 136/78 128/71 143/71   Pulse: 102 103 94 95  Temp:  98 F (36.7 C) 98.3 F (36.8 C)   TempSrc:  Oral Oral   Resp: 18 23 20 17   Height:      Weight:      SpO2: 94% 92% 94% 93%    Intake/Output Summary (Last 24 hours)  at 07/10/15 0736 Last data filed at 07/10/15 0700  Gross per 24 hour  Intake 926.67 ml  Output   3127 ml  Net -2200.33 ml   Filed Weights   07/09/15 0255  Weight: 72.576 kg (160 lb)    Exam: Gen:  NAD, improved coloration of skin Cardiovascular:  RRR. Chest and lungs:   Clear to auscultation, good air movement. Abdomen:  Abdomen soft, NT/ND, + BS Extremities:  No C/E/C   Data Reviewed:    Labs: Basic Metabolic Panel:  Recent Labs Lab 07/09/15 0300 07/10/15 0230  NA 134* 131*  K 4.8 4.4  CL 102 102  CO2 19* 20*  GLUCOSE 167* 150*  BUN 29* 12  CREATININE 1.62* 1.20  CALCIUM 9.4 8.8*   GFR Estimated Creatinine Clearance: 57.8  mL/min (by C-G formula based on Cr of 1.2). Liver Function Tests:  Recent Labs Lab 07/09/15 0300 07/10/15 0230  AST 21 16  ALT 13* 12*  ALKPHOS 60 54  BILITOT 0.6 0.7  PROT 7.3 6.3*  ALBUMIN 3.7 3.2*   No results for input(s): LIPASE, AMYLASE in the last 168 hours. No results for input(s): AMMONIA in the last 168 hours. Coagulation profile No results for input(s): INR, PROTIME in the last 168 hours.  CBC:  Recent Labs Lab 07/09/15 0300 07/10/15 0230  WBC 18.2* 12.0*  NEUTROABS 15.4*  --   HGB 11.4* 10.3*  HCT 34.1* 31.6*  MCV 82.4 82.9  PLT 501* 396   Cardiac Enzymes:  Recent Labs Lab 07/09/15 0300  TROPONINI <0.03  <0.03   BNP (last 3 results) No results for input(s): PROBNP in the last 8760 hours. CBG:  Recent Labs Lab 07/09/15 0554 07/09/15 0824 07/09/15 1249 07/09/15 1704 07/09/15 2104  GLUCAP 183* 152* 146* 151* 185*   D-Dimer: No results for input(s): DDIMER in the last 72 hours. Hgb A1c: No results for input(s): HGBA1C in the last 72 hours. Lipid Profile: No results for input(s): CHOL, HDL, LDLCALC, TRIG, CHOLHDL, LDLDIRECT in the last 72 hours. Thyroid function studies:  Recent Labs  07/09/15 0300  TSH 0.967   Anemia work up: No results for input(s): VITAMINB12, FOLATE, FERRITIN, TIBC, IRON, RETICCTPCT in the last 72 hours. Sepsis Labs:  Recent Labs Lab 07/09/15 0300 07/10/15 0230  PROCALCITON <0.10  --   WBC 18.2* 12.0*  LATICACIDVEN 2.6*  --    Microbiology Recent Results (from the past 240 hour(s))  Blood Culture (routine x 2)     Status: None (Preliminary result)   Collection Time: 07/09/15  3:13 AM  Result Value Ref Range Status   Specimen Description BLOOD RIGHT FOREARM  Final   Special Requests BOTTLES DRAWN AEROBIC AND ANAEROBIC 8CC EACH  Final   Culture NO GROWTH <12 HOURS  Final   Report Status PENDING  Incomplete  Blood Culture (routine x 2)     Status: None (Preliminary result)   Collection Time: 07/09/15   3:25 AM  Result Value Ref Range Status   Specimen Description BLOOD LEFT ANTECUBITAL  Final   Special Requests BOTTLES DRAWN AEROBIC AND ANAEROBIC 10CC EACH  Final   Culture PENDING  Incomplete   Report Status PENDING  Incomplete  MRSA PCR Screening     Status: None   Collection Time: 07/09/15  7:11 AM  Result Value Ref Range Status   MRSA by PCR NEGATIVE NEGATIVE Final    Comment:        The GeneXpert MRSA Assay (FDA approved for NASAL specimens only), is  one component of a comprehensive MRSA colonization surveillance program. It is not intended to diagnose MRSA infection nor to guide or monitor treatment for MRSA infections.      Medications:   . antiseptic oral rinse  7 mL Mouth Rinse BID  . aspirin  81 mg Oral Daily  . cefTAZidime (FORTAZ)  IV  1 g Intravenous Q12H  . clopidogrel  75 mg Oral Q breakfast  . gabapentin  300 mg Oral TID  . guaiFENesin  1,200 mg Oral BID  . heparin  5,000 Units Subcutaneous 3 times per day  . insulin aspart  0-5 Units Subcutaneous QHS  . insulin aspart  0-9 Units Subcutaneous TID WC  . simvastatin  20 mg Oral QHS  . sodium chloride  3 mL Intravenous Q12H  . vancomycin  750 mg Intravenous Q12H   Continuous Infusions: . sodium chloride 1,000 mL (07/10/15 0443)    Time spent: 25 min   LOS: 1 day   [name]  [facility] Pager [phone]  *Please refer to [email or web address], password [email or web address] to get updated schedule on who will round on this patient, as hospitalists switch teams weekly. If 7PM-7AM, please contact night-coverage at [email or web address], password [email or web address] for any overnight needs.  07/10/2015, 7:36 AM

## 2015-07-11 LAB — BASIC METABOLIC PANEL
Anion gap: 9 (ref 5–15)
BUN: 11 mg/dL (ref 6–20)
CALCIUM: 8.7 mg/dL — AB (ref 8.9–10.3)
CO2: 24 mmol/L (ref 22–32)
CREATININE: 1.19 mg/dL (ref 0.61–1.24)
Chloride: 100 mmol/L — ABNORMAL LOW (ref 101–111)
GFR calc Af Amer: >60 mL/min (ref >60)
GLUCOSE: 179 mg/dL — AB (ref 65–99)
Potassium: 4.2 mmol/L (ref 3.5–5.1)
Sodium: 133 mmol/L — ABNORMAL LOW (ref 135–145)

## 2015-07-11 LAB — GLUCOSE, CAPILLARY
Glucose-Capillary: 192 mg/dL — ABNORMAL HIGH (ref 65–99)
Glucose-Capillary: 280 mg/dL — ABNORMAL HIGH (ref 65–99)
Glucose-Capillary: 215 mg/dL — ABNORMAL HIGH (ref 65–99)
Glucose-Capillary: 255 mg/dL — ABNORMAL HIGH (ref 65–99)

## 2015-07-11 MED ORDER — LEVOFLOXACIN 500 MG PO TABS
500.0000 mg | ORAL_TABLET | Freq: Every day | ORAL | Status: DC
  Administered 2015-07-11 – 2015-07-12 (×2): 500 mg via ORAL
  Filled 2015-07-11 (×2): qty 1

## 2015-07-11 NOTE — Progress Notes (Signed)
[facility] PROGRESS NOTE    Progress Note   [name] [id number] DOB: [birth year] DOA: 07/09/2015 PCP: No primary care provider on file.   Brief Narrative:   [name] is an 69 y.o. male admitted for sepsis and PNA.  Assessment/Plan:  Sepsis ([place]) due to CAP (community acquired pneumonia) - Started empirically on Vanc and fortaz on 11.25.2016. Has remained afebrile we'll de-escalate antibiotic regimen to Levaquin. - BCx pending, KVO IV fluid. - Strict I and O's  AKI (acute kidney injury) ([place]): - baseline Cr 1.5, around 1.0. - Resolved with IV hydration  CAD (coronary artery disease)/  PVD (peripheral vascular disease) ([place]): - cont ASA and plavis, EKG no ST segment changes Cardiac Biomakers negative x1. - Pleuritic pain improved.  Essential hypertension: Hold antihypertensive medications.  Neuropathy, peripheral ([place]): Cont neurontin.  HLD (hyperlipidemia) Cont statins    DVT Prophylaxis - Lovenox ordered.  Family Communication: wife Disposition Plan: Home in am Code Status:     Code Status Orders        Start     Ordered   07/09/15 0552  Full code   Continuous     07/09/15 0600        IV Access:    Peripheral IV   Procedures and diagnostic studies:   No results found.   Medical Consultants:    None.  Anti-Infectives:   Anti-infectives    Start     Dose/Rate Route Frequency Ordered Stop   07/10/15 1500  vancomycin (VANCOCIN) IVPB 1000 mg/200 mL premix     1,000 mg 200 mL/hr over 60 Minutes Intravenous Every 12 hours 07/10/15 0853     07/10/15 1500  cefTAZidime (FORTAZ) 1 g in dextrose 5 % 50 mL IVPB     1 g 100 mL/hr over 30 Minutes Intravenous 3 times per day 07/10/15 0853     07/09/15 1600  vancomycin (VANCOCIN) IVPB 750 mg/150 ml premix  Status:  Discontinued     750 mg 150 mL/hr over 60 Minutes Intravenous Every 12 hours 07/09/15 0827 07/10/15 0853   07/09/15 0830  cefTAZidime (FORTAZ) 1 g in dextrose 5 % 50 mL  IVPB  Status:  Discontinued     1 g 100 mL/hr over 30 Minutes Intravenous Every 12 hours 07/09/15 0746 07/10/15 0853   07/09/15 0315  piperacillin-tazobactam (ZOSYN) IVPB 3.375 g     3.375 g 100 mL/hr over 30 Minutes Intravenous  Once 07/09/15 0309 07/09/15 0411   07/09/15 0315  vancomycin (VANCOCIN) IVPB 1000 mg/200 mL premix     1,000 mg 200 mL/hr over 60 Minutes Intravenous  Once 07/09/15 0309 07/09/15 [id number]      Subjective:    [name] no new complaints feels great.  Objective:    Filed Vitals:   07/10/15 1033 07/10/15 1420 07/10/15 2141 07/11/15 0604  BP: 145/64 102/60 114/77 133/74  Pulse: 106 107 95 90  Temp: 98.8 F (37.1 C) 98.9 F (37.2 C) 98.7 F (37.1 C) 98.2 F (36.8 C)  TempSrc: Oral Oral Oral Oral  Resp: 20 18 18 18   Height:      Weight:      SpO2: 98% 95% 93% 95%    Intake/Output Summary (Last 24 hours) at 07/11/15 1243 Last data filed at 07/11/15 1027  Gross per 24 hour  Intake   1660 ml  Output    725 ml  Net    935 ml   Filed Weights   07/09/15 0255  Weight:  72.576 kg (160 lb)    Exam: Gen:  NAD, improved coloration of skin Cardiovascular:  RRR. Chest and lungs:   Clear to auscultation, good air movement. Abdomen:  Abdomen soft, NT/ND, + BS Extremities:  No C/E/C   Data Reviewed:    Labs: Basic Metabolic Panel:  Recent Labs Lab 07/09/15 0300 07/10/15 0230 07/11/15 0421  NA 134* 131* 133*  K 4.8 4.4 4.2  CL 102 102 100*  CO2 19* 20* 24  GLUCOSE 167* 150* 179*  BUN 29* 12 11  CREATININE 1.62* 1.20 1.19  CALCIUM 9.4 8.8* 8.7*   GFR Estimated Creatinine Clearance: 58.3 mL/min (by C-G formula based on Cr of 1.19). Liver Function Tests:  Recent Labs Lab 07/09/15 0300 07/10/15 0230  AST 21 16  ALT 13* 12*  ALKPHOS 60 54  BILITOT 0.6 0.7  PROT 7.3 6.3*  ALBUMIN 3.7 3.2*   No results for input(s): LIPASE, AMYLASE in the last 168 hours. No results for input(s): AMMONIA in the last 168 hours. Coagulation  profile No results for input(s): INR, PROTIME in the last 168 hours.  CBC:  Recent Labs Lab 07/09/15 0300 07/10/15 0230  WBC 18.2* 12.0*  NEUTROABS 15.4*  --   HGB 11.4* 10.3*  HCT 34.1* 31.6*  MCV 82.4 82.9  PLT 501* 396   Cardiac Enzymes:  Recent Labs Lab 07/09/15 0300  TROPONINI <0.03  <0.03   BNP (last 3 results) No results for input(s): PROBNP in the last 8760 hours. CBG:  Recent Labs Lab 07/10/15 1135 07/10/15 1653 07/10/15 2139 07/11/15 0829 07/11/15 1155  GLUCAP 233* 176* 221* 192* 280*   D-Dimer: No results for input(s): DDIMER in the last 72 hours. Hgb A1c: No results for input(s): HGBA1C in the last 72 hours. Lipid Profile: No results for input(s): CHOL, HDL, LDLCALC, TRIG, CHOLHDL, LDLDIRECT in the last 72 hours. Thyroid function studies:  Recent Labs  07/09/15 0300  TSH 0.967   Anemia work up: No results for input(s): VITAMINB12, FOLATE, FERRITIN, TIBC, IRON, RETICCTPCT in the last 72 hours. Sepsis Labs:  Recent Labs Lab 07/09/15 0300 07/10/15 0230  PROCALCITON <0.10  --   WBC 18.2* 12.0*  LATICACIDVEN 2.6*  --    Microbiology Recent Results (from the past 240 hour(s))  Blood Culture (routine x 2)     Status: None (Preliminary result)   Collection Time: 07/09/15  3:13 AM  Result Value Ref Range Status   Specimen Description BLOOD RIGHT FOREARM  Final   Special Requests BOTTLES DRAWN AEROBIC AND ANAEROBIC [place]  Final   Culture NO GROWTH 2 DAYS  Final   Report Status PENDING  Incomplete  Blood Culture (routine x 2)     Status: None (Preliminary result)   Collection Time: 07/09/15  3:25 AM  Result Value Ref Range Status   Specimen Description BLOOD LEFT ANTECUBITAL  Final   Special Requests BOTTLES DRAWN AEROBIC AND ANAEROBIC 10CC EACH  Final   Culture PENDING  Incomplete   Report Status PENDING  Incomplete  Urine culture     Status: None   Collection Time: 07/09/15  3:30 AM  Result Value Ref Range Status   Specimen  Description URINE, CLEAN CATCH  Final   Special Requests NONE  Final   Culture   Final    7,000 COLONIES/mL INSIGNIFICANT GROWTH Performed at [facility]    Report Status 07/10/2015 FINAL  Final  MRSA PCR Screening     Status: None   Collection Time: 07/09/15  7:11 AM  Result Value Ref Range Status   MRSA by PCR NEGATIVE NEGATIVE Final    Comment:        The GeneXpert MRSA Assay (FDA approved for NASAL specimens only), is one component of a comprehensive MRSA colonization surveillance program. It is not intended to diagnose MRSA infection nor to guide or monitor treatment for MRSA infections.      Medications:   . aspirin  81 mg Oral Daily  . cefTAZidime (FORTAZ)  IV  1 g Intravenous 3 times per day  . cholecalciferol  2,000 Units Oral Daily  . clopidogrel  75 mg Oral Q breakfast  . ferrous sulfate  325 mg Oral Q supper  . ferrous sulfate  650 mg Oral Q breakfast  . gabapentin  300 mg Oral TID  . guaiFENesin  1,200 mg Oral BID  . heparin  5,000 Units Subcutaneous 3 times per day  . insulin aspart  0-5 Units Subcutaneous QHS  . insulin aspart  0-9 Units Subcutaneous TID WC  . pantoprazole  40 mg Oral Daily  . simvastatin  20 mg Oral QHS  . sodium chloride  3 mL Intravenous Q12H  . vancomycin  1,000 mg Intravenous Q12H   Continuous Infusions: . sodium chloride 1,000 mL (07/11/15 0115)    Time spent: 25 min   LOS: 2 days   [name]  [facility] Pager [phone]  *Please refer to [email or web address], password [email or web address] to get updated schedule on who will round on this patient, as hospitalists switch teams weekly. If 7PM-7AM, please contact night-coverage at [email or web address], password [email or web address] for any overnight needs.  07/11/2015, 12:43 PM

## 2015-07-12 DIAGNOSIS — G603 Idiopathic progressive neuropathy: Secondary | ICD-10-CM

## 2015-07-12 LAB — GLUCOSE, CAPILLARY
GLUCOSE-CAPILLARY: 232 mg/dL — AB (ref 65–99)
GLUCOSE-CAPILLARY: 307 mg/dL — AB (ref 65–99)

## 2015-07-12 MED ORDER — INSULIN DETEMIR 100 UNIT/ML ~~LOC~~ SOLN
10.0000 [IU] | Freq: Two times a day (BID) | SUBCUTANEOUS | Status: DC
  Filled 2015-07-12 (×2): qty 0.1

## 2015-07-12 MED ORDER — LEVOFLOXACIN 500 MG PO TABS: 500.0000 mg | ORAL_TABLET | Freq: Every day | ORAL | Status: DC

## 2015-07-12 NOTE — Care Management Important Message (Signed)
Important Message  Patient Details  Name: [name] MRN: [id number] Date of Birth: 04-22-1946   Medicare Important Message Given:  Yes    Nathen May 07/12/2015, 3:39 PM

## 2015-07-12 NOTE — Progress Notes (Signed)
NURSING PROGRESS NOTE  [name] [id number] Discharge Data: 07/12/2015 11:24 AM Attending Provider: [name], MD PCP:No primary care provider on file.     [name] to be D/C'd Home per MD order.  Discussed with the patient the After Visit Summary and all questions fully answered. All IV's discontinued with no bleeding noted. All belongings returned to patient for patient to take home.   Last Vital Signs:  Blood pressure 140/73, pulse 77, temperature 97.8 F (36.6 C), temperature source Oral, resp. rate 16, height 5\' 8"  (1.727 m), weight 72.576 kg (160 lb), SpO2 97 %.  Discharge Medication List   Medication List    STOP taking these medications        cetirizine 10 MG tablet  Commonly known as:  ZYRTEC      TAKE these medications        amLODipine 10 MG tablet  Commonly known as:  NORVASC  Take 10 mg by mouth daily.     aspirin 81 MG tablet  Take 81 mg by mouth daily.     clopidogrel 75 MG tablet  Commonly known as:  PLAVIX  Take 75 mg by mouth daily with breakfast.     ferrous sulfate 325 (65 FE) MG tablet  Take 325-650 mg by mouth 2 (two) times daily with a meal. 650mg  in the morning and 325mg  in the evening     gabapentin 300 MG capsule  Commonly known as:  NEURONTIN  Take 300 mg by mouth 3 (three) times daily.     glipiZIDE 5 MG tablet  Commonly known as:  GLUCOTROL  Take 10 mg by mouth 2 (two) times daily before a meal.     hydrochlorothiazide 25 MG tablet  Commonly known as:  HYDRODIURIL  Take 25 mg by mouth daily.     levofloxacin 500 MG tablet  Commonly known as:  LEVAQUIN  Take 1 tablet (500 mg total) by mouth daily.     lisinopril 40 MG tablet  Commonly known as:  PRINIVIL,ZESTRIL  Take 40 mg by mouth daily.     metFORMIN 1000 MG tablet  Commonly known as:  GLUCOPHAGE  Take 1,000 mg by mouth 2 (two) times daily with a meal.     pantoprazole 40 MG tablet  Commonly known as:  PROTONIX  Take 40 mg by mouth daily.     simvastatin 40 MG tablet  Commonly known as:  ZOCOR  Take 20 mg by mouth at bedtime.     Vitamin D 2000 UNITS tablet  Take 2,000 Units by mouth daily.         Charolette Child, RN

## 2015-07-12 NOTE — Discharge Summary (Signed)
Physician Discharge Summary  [name] [id number] DOB: 02/28/1946 DOA: 07/09/2015  PCP: No primary care provider on file.  Admit date: 07/09/2015 Discharge date: 07/12/2015  Time spent: 35 minutes  Recommendations for Outpatient Follow-up:  1. Follow-up with PCP in 2-4 weeks we'll repeat a chest x-ray at this time   Discharge Diagnoses:  Principal Problem:   Sepsis ([place]) Active Problems:   CAP (community acquired pneumonia)   CAD (coronary artery disease)   PVD (peripheral vascular disease) ([place])   Essential hypertension   Hypotension   Neuropathy, peripheral ([place])   HLD (hyperlipidemia)   AKI (acute kidney injury) ([place])   Discharge Condition: Stable  Diet recommendation: Heart healthy  Filed Weights   07/09/15 0255  Weight: 72.576 kg (160 lb)    History of present illness:  69 year old that comes in for acute chest pain and chills that started on the night of admission. He relates he did not have a temperature at home but did have chest pain and shortness of breath.  Hospital Course:  Sepsis due to community-acquired pneumonia: On admission he was started on IV vancomycin and Fortaz on 07/09/2015, he defervesced his leukocytosis resolved. Once he was able to tolerate orals his lower quadrants with changed to orals. Blood cultures remain negative. He will continue Levaquin for 4 additional days.  Acute kidney injury: There is likely. Known etiology tissues over the rehydration.  Coronary artery disease/peripheral vascular disease: No changes were made to his medication.  Essential hypertension: No changes were made to his medication.  Peripheral neuropathy: No changes were made to his medication.  Hyperlipidemia: Continue statins to changes related to his medication.  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   Procedures:  CXR    Consultations:  none  Discharge Exam: Filed Vitals:   07/11/15 2141 07/12/15 0546  BP: 138/66 140/73  Pulse: 90 77  Temp: 97.7 F (36.5 C) 97.8 F (36.6 C)  Resp: 20 16    General: A&O x3 Cardiovascular: RRR Respiratory: good air movement CTA B/L  Discharge Instructions   Discharge Instructions    Diet - low sodium heart healthy    Complete by:  As directed      Increase activity slowly    Complete by:  As directed           Current Discharge Medication List    START taking these medications   Details  levofloxacin (LEVAQUIN) 500 MG tablet Take 1 tablet (500 mg total) by mouth daily. Qty: 4 tablet, Refills: 0      CONTINUE these medications which have NOT CHANGED   Details  amLODipine (NORVASC) 10 MG tablet Take 10 mg by mouth daily.    aspirin 81 MG tablet Take 81 mg by mouth daily.    Cholecalciferol (VITAMIN D) 2000 UNITS tablet Take 2,000 Units by mouth daily.    clopidogrel (PLAVIX) 75 MG tablet Take 75 mg by mouth daily with breakfast.    ferrous sulfate 325 (65 FE) MG tablet Take 325-650 mg by mouth 2 (two) times daily with a meal. 650mg  in the morning and 325mg  in the evening    gabapentin (NEURONTIN) 300 MG capsule Take 300 mg by mouth 3 (three) times daily.     glipiZIDE (GLUCOTROL) 5 MG tablet Take 10 mg by mouth 2 (two) times daily before a meal.     hydrochlorothiazide (HYDRODIURIL) 25 MG tablet Take 25 mg by mouth daily.  lisinopril (PRINIVIL,ZESTRIL) 40 MG tablet Take 40 mg by mouth daily.    metFORMIN (GLUCOPHAGE) 1000 MG tablet Take 1,000 mg by mouth 2 (two) times daily with a meal.    pantoprazole (PROTONIX) 40 MG tablet Take 40 mg by mouth daily.    simvastatin (ZOCOR) 40 MG tablet Take 20 mg by mouth at bedtime.       STOP taking these medications     cetirizine (ZYRTEC) 10 MG tablet        No Known Allergies Follow-up Information    Follow up with [name], MD.   Specialty:  Nurse Practitioner   Why:  follow  up with in 4 weeks repeat CXR   Contact information:   [address] [place] [address] [phone]        The results of significant diagnostics from this hospitalization (including imaging, microbiology, ancillary and laboratory) are listed below for reference.    Significant Diagnostic Studies: Dg Chest 2 View  07/09/2015  CLINICAL DATA:  Acute onset of mid sternal chest pain and jaw pain. Initial encounter. EXAM: CHEST  2 VIEW COMPARISON:  Chest radiograph performed 03/15/2015 FINDINGS: The lungs are well-aerated. Patchy bilateral airspace opacities raise concern for multifocal pneumonia, though mild interstitial edema might have a similar appearance. No definite pleural effusion or pneumothorax is seen. The heart is mildly enlarged. No acute osseous abnormalities are seen. IMPRESSION: Patchy bilateral airspace opacities raise concern for multifocal pneumonia, though mild interstitial edema might have a similar appearance. Mild cardiomegaly noted. Electronically Signed   By: Garald Balding M.D.   On: 07/09/2015 04:17    Microbiology: Recent Results (from the past 240 hour(s))  Blood Culture (routine x 2)     Status: None (Preliminary result)   Collection Time: 07/09/15  3:13 AM  Result Value Ref Range Status   Specimen Description BLOOD RIGHT FOREARM  Final   Special Requests BOTTLES DRAWN AEROBIC AND ANAEROBIC [place]  Final   Culture NO GROWTH 3 DAYS  Final   Report Status PENDING  Incomplete  Blood Culture (routine x 2)     Status: None (Preliminary result)   Collection Time: 07/09/15  3:25 AM  Result Value Ref Range Status   Specimen Description BLOOD LEFT ANTECUBITAL  Final   Special Requests BOTTLES DRAWN AEROBIC AND ANAEROBIC 10CC EACH  Final   Culture PENDING  Incomplete   Report Status PENDING  Incomplete  Urine culture     Status: None   Collection Time: 07/09/15  3:30 AM  Result Value Ref Range Status   Specimen Description URINE, CLEAN CATCH  Final    Special Requests NONE  Final   Culture   Final    7,000 COLONIES/mL INSIGNIFICANT GROWTH Performed at [facility]    Report Status 07/10/2015 FINAL  Final  MRSA PCR Screening     Status: None   Collection Time: 07/09/15  7:11 AM  Result Value Ref Range Status   MRSA by PCR NEGATIVE NEGATIVE Final    Comment:        The GeneXpert MRSA Assay (FDA approved for NASAL specimens only), is one component of a comprehensive MRSA colonization surveillance program. It is not intended to diagnose MRSA infection nor to guide or monitor treatment for MRSA infections.      Labs: Basic Metabolic Panel:  Recent Labs Lab 07/09/15 0300 07/10/15 0230 07/11/15 0421  NA 134* 131* 133*  K 4.8 4.4 4.2  CL 102 102 100*  CO2 19*  20* 24  GLUCOSE 167* 150* 179*  BUN 29* 12 11  CREATININE 1.62* 1.20 1.19  CALCIUM 9.4 8.8* 8.7*   Liver Function Tests:  Recent Labs Lab 07/09/15 0300 07/10/15 0230  AST 21 16  ALT 13* 12*  ALKPHOS 60 54  BILITOT 0.6 0.7  PROT 7.3 6.3*  ALBUMIN 3.7 3.2*   No results for input(s): LIPASE, AMYLASE in the last 168 hours. No results for input(s): AMMONIA in the last 168 hours. CBC:  Recent Labs Lab 07/09/15 0300 07/10/15 0230  WBC 18.2* 12.0*  NEUTROABS 15.4*  --   HGB 11.4* 10.3*  HCT 34.1* 31.6*  MCV 82.4 82.9  PLT 501* 396   Cardiac Enzymes:  Recent Labs Lab 07/09/15 0300  TROPONINI <0.03  <0.03   BNP: BNP (last 3 results) No results for input(s): BNP in the last 8760 hours.  ProBNP (last 3 results) No results for input(s): PROBNP in the last 8760 hours.  CBG:  Recent Labs Lab 07/11/15 0829 07/11/15 1155 07/11/15 1638 07/11/15 2052 07/12/15 0805  GLUCAP 192* 280* 215* 255* 232*       Signed:  [name]  [facility] 07/12/2015, 11:01 AM

## 2015-07-12 NOTE — Progress Notes (Signed)
Inpatient Diabetes Program Recommendations  AACE/ADA: New Consensus Statement on Inpatient Glycemic Control (2015)  Target Ranges:  Prepandial:   less than 140 mg/dL      Peak postprandial:   less than 180 mg/dL (1-2 hours)      Critically ill patients:  140 - 180 mg/dL   Review of Glycemic Control:  Results for QUINTERRIUS, MEUTH (MRN [id number]) as of 07/12/2015 10:00  Ref. Range 07/11/2015 08:29 07/11/2015 11:55 07/11/2015 16:38 07/11/2015 20:52 07/12/2015 08:05  Glucose-Capillary Latest Ref Range: 65-99 mg/dL 192 (H) 280 (H) 215 (H) 255 (H) 232 (H)    Diabetes history: Type 2 diabetes Outpatient Diabetes medications: Metformin 1000 mg bid, Glucotrol 10 mg bid Current orders for Inpatient glycemic control: Novolog sensitive tid with meals and HS  Inpatient Diabetes Program Recommendations:    Note that CBG's are greater than hospital goal.  Please consider adding Levemir 14 units daily while patient is in the hospital.  A1C pending.  Also may consider adding Novolog meal coverage 3 units tid with meals.  Thanks, Adah Perl, RN, BC-ADM Inpatient Diabetes Coordinator Pager [phone] (8a-5p)

## 2015-07-12 NOTE — Care Management Note (Signed)
Case Management Note  Patient Details  Name: [name] MRN: [id number] Date of Birth: Oct 07, 1945  Subjective/Objective:    Patient for dc , no needs.                Action/Plan:   Expected Discharge Date:                  Expected Discharge Plan:  Home/Self Care  In-House Referral:     Discharge planning Services  CM Consult  Post Acute Care Choice:    Choice offered to:     DME Arranged:    DME Agency:     HH Arranged:    [place] Agency:     Status of Service:  Completed, signed off  Medicare Important Message Given:    Date Medicare IM Given:    Medicare IM give by:    Date Additional Medicare IM Given:    Additional Medicare Important Message give by:     If discussed at [place] of Stay Meetings, dates discussed:    Additional Comments:  Zenon Mayo, RN 07/12/2015, 2:28 PM

## 2015-07-14 LAB — CULTURE, BLOOD (ROUTINE X 2): CULTURE: NO GROWTH

## 2015-07-15 LAB — CULTURE, BLOOD (ROUTINE X 2): Culture: NO GROWTH

## 2015-10-09 ENCOUNTER — Encounter (HOSPITAL_COMMUNITY): Payer: Self-pay

## 2015-10-09 ENCOUNTER — Inpatient Hospital Stay (HOSPITAL_COMMUNITY)
Admission: EM | Admit: 2015-10-09 | Discharge: 2015-10-22 | DRG: 233 | Disposition: A | Discharge status: Home / Self Care | Payer: Non-veteran care | Attending: Thoracic Surgery (Cardiothoracic Vascular Surgery) | Admitting: Thoracic Surgery (Cardiothoracic Vascular Surgery)

## 2015-10-09 ENCOUNTER — Other Ambulatory Visit: Payer: Self-pay

## 2015-10-09 ENCOUNTER — Emergency Department (HOSPITAL_COMMUNITY): Payer: Non-veteran care

## 2015-10-09 DIAGNOSIS — Z7982 Long term (current) use of aspirin: Secondary | ICD-10-CM

## 2015-10-09 DIAGNOSIS — I10 Essential (primary) hypertension: Secondary | ICD-10-CM | POA: Diagnosis not present

## 2015-10-09 DIAGNOSIS — R918 Other nonspecific abnormal finding of lung field: Secondary | ICD-10-CM | POA: Diagnosis not present

## 2015-10-09 DIAGNOSIS — J189 Pneumonia, unspecified organism: Secondary | ICD-10-CM

## 2015-10-09 DIAGNOSIS — Z7984 Long term (current) use of oral hypoglycemic drugs: Secondary | ICD-10-CM

## 2015-10-09 DIAGNOSIS — I129 Hypertensive chronic kidney disease with stage 1 through stage 4 chronic kidney disease, or unspecified chronic kidney disease: Secondary | ICD-10-CM | POA: Diagnosis not present

## 2015-10-09 DIAGNOSIS — K219 Gastro-esophageal reflux disease without esophagitis: Secondary | ICD-10-CM | POA: Diagnosis not present

## 2015-10-09 DIAGNOSIS — N179 Acute kidney failure, unspecified: Secondary | ICD-10-CM | POA: Diagnosis present

## 2015-10-09 DIAGNOSIS — E1140 Type 2 diabetes mellitus with diabetic neuropathy, unspecified: Secondary | ICD-10-CM | POA: Diagnosis not present

## 2015-10-09 DIAGNOSIS — E7800 Pure hypercholesterolemia, unspecified: Secondary | ICD-10-CM | POA: Diagnosis present

## 2015-10-09 DIAGNOSIS — N183 Chronic kidney disease, stage 3 (moderate): Secondary | ICD-10-CM | POA: Diagnosis present

## 2015-10-09 DIAGNOSIS — D62 Acute posthemorrhagic anemia: Secondary | ICD-10-CM | POA: Diagnosis not present

## 2015-10-09 DIAGNOSIS — Y95 Nosocomial condition: Secondary | ICD-10-CM | POA: Diagnosis not present

## 2015-10-09 DIAGNOSIS — E860 Dehydration: Secondary | ICD-10-CM | POA: Diagnosis not present

## 2015-10-09 DIAGNOSIS — E871 Hypo-osmolality and hyponatremia: Secondary | ICD-10-CM | POA: Diagnosis not present

## 2015-10-09 DIAGNOSIS — E1165 Type 2 diabetes mellitus with hyperglycemia: Secondary | ICD-10-CM | POA: Diagnosis present

## 2015-10-09 DIAGNOSIS — N182 Chronic kidney disease, stage 2 (mild): Secondary | ICD-10-CM

## 2015-10-09 DIAGNOSIS — J440 Chronic obstructive pulmonary disease with acute lower respiratory infection: Secondary | ICD-10-CM | POA: Diagnosis present

## 2015-10-09 DIAGNOSIS — Z87891 Personal history of nicotine dependence: Secondary | ICD-10-CM | POA: Diagnosis not present

## 2015-10-09 DIAGNOSIS — I209 Angina pectoris, unspecified: Secondary | ICD-10-CM

## 2015-10-09 DIAGNOSIS — J441 Chronic obstructive pulmonary disease with (acute) exacerbation: Secondary | ICD-10-CM | POA: Diagnosis not present

## 2015-10-09 DIAGNOSIS — J9811 Atelectasis: Secondary | ICD-10-CM | POA: Diagnosis not present

## 2015-10-09 DIAGNOSIS — R9389 Abnormal findings on diagnostic imaging of other specified body structures: Secondary | ICD-10-CM | POA: Diagnosis present

## 2015-10-09 DIAGNOSIS — Z4682 Encounter for fitting and adjustment of non-vascular catheter: Secondary | ICD-10-CM | POA: Diagnosis not present

## 2015-10-09 DIAGNOSIS — E1122 Type 2 diabetes mellitus with diabetic chronic kidney disease: Secondary | ICD-10-CM | POA: Diagnosis present

## 2015-10-09 DIAGNOSIS — E785 Hyperlipidemia, unspecified: Secondary | ICD-10-CM | POA: Diagnosis present

## 2015-10-09 DIAGNOSIS — Z955 Presence of coronary angioplasty implant and graft: Secondary | ICD-10-CM | POA: Diagnosis not present

## 2015-10-09 DIAGNOSIS — R197 Diarrhea, unspecified: Secondary | ICD-10-CM | POA: Diagnosis not present

## 2015-10-09 DIAGNOSIS — R079 Chest pain, unspecified: Secondary | ICD-10-CM

## 2015-10-09 DIAGNOSIS — Z7902 Long term (current) use of antithrombotics/antiplatelets: Secondary | ICD-10-CM | POA: Diagnosis not present

## 2015-10-09 DIAGNOSIS — R938 Abnormal findings on diagnostic imaging of other specified body structures: Secondary | ICD-10-CM | POA: Diagnosis not present

## 2015-10-09 DIAGNOSIS — Z951 Presence of aortocoronary bypass graft: Secondary | ICD-10-CM | POA: Diagnosis present

## 2015-10-09 DIAGNOSIS — I200 Unstable angina: Secondary | ICD-10-CM

## 2015-10-09 DIAGNOSIS — E1100 Type 2 diabetes mellitus with hyperosmolarity without nonketotic hyperglycemic-hyperosmolar coma (NKHHC): Secondary | ICD-10-CM

## 2015-10-09 DIAGNOSIS — Z79899 Other long term (current) drug therapy: Secondary | ICD-10-CM | POA: Diagnosis not present

## 2015-10-09 DIAGNOSIS — E1151 Type 2 diabetes mellitus with diabetic peripheral angiopathy without gangrene: Secondary | ICD-10-CM | POA: Diagnosis present

## 2015-10-09 DIAGNOSIS — I25110 Atherosclerotic heart disease of native coronary artery with unstable angina pectoris: Secondary | ICD-10-CM | POA: Diagnosis not present

## 2015-10-09 DIAGNOSIS — A419 Sepsis, unspecified organism: Secondary | ICD-10-CM | POA: Diagnosis present

## 2015-10-09 DIAGNOSIS — I2510 Atherosclerotic heart disease of native coronary artery without angina pectoris: Secondary | ICD-10-CM

## 2015-10-09 DIAGNOSIS — E119 Type 2 diabetes mellitus without complications: Secondary | ICD-10-CM

## 2015-10-09 HISTORY — DX: Coronary angioplasty status: Z98.61

## 2015-10-09 HISTORY — DX: Atherosclerotic heart disease of native coronary artery without angina pectoris: I25.10

## 2015-10-09 LAB — TROPONIN I
Troponin I: <0.03 ng/mL (ref <0.031)
Troponin I: <0.03 ng/mL (ref <0.031)
Troponin I: <0.03 ng/mL (ref <0.031)

## 2015-10-09 LAB — COMPREHENSIVE METABOLIC PANEL
ALT: 11 U/L — AB (ref 17–63)
AST: 16 U/L (ref 15–41)
Albumin: 3.7 g/dL (ref 3.5–5.0)
Alkaline Phosphatase: 59 U/L (ref 38–126)
Anion gap: 12 (ref 5–15)
BILIRUBIN TOTAL: 0.2 mg/dL — AB (ref 0.3–1.2)
BUN: 23 mg/dL — ABNORMAL HIGH (ref 6–20)
CALCIUM: 8.6 mg/dL — AB (ref 8.9–10.3)
CO2: 20 mmol/L — AB (ref 22–32)
CREATININE: 1.47 mg/dL — AB (ref 0.61–1.24)
Chloride: 102 mmol/L (ref 101–111)
GFR calc Af Amer: 55 mL/min — ABNORMAL LOW (ref >60)
GFR, EST NON AFRICAN AMERICAN: 47 mL/min — AB (ref >60)
GLUCOSE: 97 mg/dL (ref 65–99)
Potassium: 3.6 mmol/L (ref 3.5–5.1)
Sodium: 134 mmol/L — ABNORMAL LOW (ref 135–145)
Total Protein: 7.2 g/dL (ref 6.5–8.1)

## 2015-10-09 LAB — CBC WITH DIFFERENTIAL/PLATELET
BASOS ABS: 0 10*3/uL (ref 0.0–0.1)
Basophils Relative: 0 %
Eosinophils Absolute: 0.1 10*3/uL (ref 0.0–0.7)
Eosinophils Relative: 1 %
HEMATOCRIT: 32.4 % — AB (ref 39.0–52.0)
Hemoglobin: 10.7 g/dL — ABNORMAL LOW (ref 13.0–17.0)
LYMPHS PCT: 11 %
Lymphs Abs: 1.6 10*3/uL (ref 0.7–4.0)
MCH: 28 pg (ref 26.0–34.0)
MCHC: 33 g/dL (ref 30.0–36.0)
MCV: 84.8 fL (ref 78.0–100.0)
MONO ABS: 1 10*3/uL (ref 0.1–1.0)
MONOS PCT: 7 %
NEUTROS ABS: 12.3 10*3/uL — AB (ref 1.7–7.7)
Neutrophils Relative %: 81 %
Platelets: 435 10*3/uL — ABNORMAL HIGH (ref 150–400)
RBC: 3.82 MIL/uL — ABNORMAL LOW (ref 4.22–5.81)
RDW: 15 % (ref 11.5–15.5)
WBC: 15.1 10*3/uL — ABNORMAL HIGH (ref 4.0–10.5)

## 2015-10-09 LAB — PROCALCITONIN: Procalcitonin: <0.1 ng/mL

## 2015-10-09 LAB — CBG MONITORING, ED: GLUCOSE-CAPILLARY: 141 mg/dL — AB (ref 65–99)

## 2015-10-09 LAB — BRAIN NATRIURETIC PEPTIDE: B NATRIURETIC PEPTIDE 5: 15 pg/mL (ref 0.0–100.0)

## 2015-10-09 LAB — PROTIME-INR
INR: 1.07 (ref 0.00–1.49)
PROTHROMBIN TIME: 14.1 s (ref 11.6–15.2)

## 2015-10-09 LAB — LACTIC ACID, PLASMA
LACTIC ACID, VENOUS: 1.3 mmol/L (ref 0.5–2.0)
Lactic Acid, Venous: 1.6 mmol/L (ref 0.5–2.0)

## 2015-10-09 LAB — MAGNESIUM: MAGNESIUM: 1.4 mg/dL — AB (ref 1.7–2.4)

## 2015-10-09 LAB — APTT: aPTT: 30 seconds (ref 24–37)

## 2015-10-09 MED ORDER — METOPROLOL TARTRATE 25 MG PO TABS
25.0000 mg | ORAL_TABLET | Freq: Two times a day (BID) | ORAL | Status: DC
  Administered 2015-10-09 – 2015-10-17 (×17): 25 mg via ORAL
  Filled 2015-10-09 (×17): qty 1

## 2015-10-09 MED ORDER — ATORVASTATIN CALCIUM 80 MG PO TABS: 80.0000 mg | ORAL_TABLET | Freq: Every day | ORAL | Status: DC

## 2015-10-09 MED ORDER — SIMVASTATIN 20 MG PO TABS: 20.0000 mg | ORAL_TABLET | Freq: Every day | ORAL | Status: DC

## 2015-10-09 MED ORDER — HEPARIN SODIUM (PORCINE) 5000 UNIT/ML IJ SOLN
5000.0000 [IU] | Freq: Three times a day (TID) | INTRAMUSCULAR | Status: DC
  Administered 2015-10-09 – 2015-10-11 (×7): 5000 [IU] via SUBCUTANEOUS
  Filled 2015-10-09 (×7): qty 1

## 2015-10-09 MED ORDER — VANCOMYCIN HCL IN DEXTROSE 1-5 GM/200ML-% IV SOLN
1000.0000 mg | Freq: Once | INTRAVENOUS | Status: AC
  Administered 2015-10-09: 1000 mg via INTRAVENOUS
  Filled 2015-10-09: qty 200

## 2015-10-09 MED ORDER — METOPROLOL TARTRATE 12.5 MG HALF TABLET
12.5000 mg | ORAL_TABLET | Freq: Two times a day (BID) | ORAL | Status: DC
  Administered 2015-10-09: 12.5 mg via ORAL
  Filled 2015-10-09: qty 1

## 2015-10-09 MED ORDER — INSULIN ASPART 100 UNIT/ML ~~LOC~~ SOLN
0.0000 [IU] | SUBCUTANEOUS | Status: DC
  Administered 2015-10-09: 1 [IU] via SUBCUTANEOUS
  Administered 2015-10-10: 2 [IU] via SUBCUTANEOUS
  Administered 2015-10-11: 3 [IU] via SUBCUTANEOUS
  Administered 2015-10-11 – 2015-10-12 (×2): 5 [IU] via SUBCUTANEOUS
  Administered 2015-10-12: 3 [IU] via SUBCUTANEOUS
  Administered 2015-10-12: 1 [IU] via SUBCUTANEOUS
  Filled 2015-10-09: qty 1

## 2015-10-09 MED ORDER — PANTOPRAZOLE SODIUM 40 MG PO TBEC
40.0000 mg | DELAYED_RELEASE_TABLET | Freq: Every day | ORAL | Status: DC
  Administered 2015-10-09 – 2015-10-17 (×9): 40 mg via ORAL
  Filled 2015-10-09 (×9): qty 1

## 2015-10-09 MED ORDER — FERROUS SULFATE 325 (65 FE) MG PO TABS: 325.0000 mg | ORAL_TABLET | Freq: Two times a day (BID) | ORAL | Status: DC

## 2015-10-09 MED ORDER — VITAMIN D 1000 UNITS PO TABS
2000.0000 [IU] | ORAL_TABLET | Freq: Every day | ORAL | Status: DC
  Administered 2015-10-10 – 2015-10-17 (×8): 2000 [IU] via ORAL
  Filled 2015-10-09 (×9): qty 2

## 2015-10-09 MED ORDER — HYDROCHLOROTHIAZIDE 25 MG PO TABS
25.0000 mg | ORAL_TABLET | Freq: Every day | ORAL | Status: DC
  Administered 2015-10-09 – 2015-10-10 (×2): 25 mg via ORAL
  Filled 2015-10-09 (×2): qty 1

## 2015-10-09 MED ORDER — VANCOMYCIN HCL IN DEXTROSE 750-5 MG/150ML-% IV SOLN
750.0000 mg | Freq: Two times a day (BID) | INTRAVENOUS | Status: DC
  Administered 2015-10-09 – 2015-10-12 (×6): 750 mg via INTRAVENOUS
  Filled 2015-10-09 (×7): qty 150

## 2015-10-09 MED ORDER — FERROUS SULFATE 325 (65 FE) MG PO TABS
650.0000 mg | ORAL_TABLET | Freq: Every day | ORAL | Status: DC
  Administered 2015-10-10 – 2015-10-17 (×8): 650 mg via ORAL
  Filled 2015-10-09 (×9): qty 2

## 2015-10-09 MED ORDER — FERROUS SULFATE 325 (65 FE) MG PO TABS
325.0000 mg | ORAL_TABLET | Freq: Every evening | ORAL | Status: DC
  Administered 2015-10-09 – 2015-10-17 (×9): 325 mg via ORAL
  Filled 2015-10-09 (×9): qty 1

## 2015-10-09 MED ORDER — ONDANSETRON HCL 4 MG/2ML IJ SOLN: 4.0000 mg | Freq: Four times a day (QID) | INTRAMUSCULAR | Status: DC | PRN

## 2015-10-09 MED ORDER — ASPIRIN 81 MG PO CHEW
324.0000 mg | CHEWABLE_TABLET | Freq: Once | ORAL | Status: AC
  Administered 2015-10-09: 324 mg via ORAL
  Filled 2015-10-09: qty 4

## 2015-10-09 MED ORDER — ASPIRIN EC 81 MG PO TBEC
81.0000 mg | DELAYED_RELEASE_TABLET | Freq: Every day | ORAL | Status: DC
  Administered 2015-10-10 – 2015-10-17 (×7): 81 mg via ORAL
  Filled 2015-10-09 (×7): qty 1

## 2015-10-09 MED ORDER — PIPERACILLIN-TAZOBACTAM 3.375 G IVPB 30 MIN
3.3750 g | Freq: Once | INTRAVENOUS | Status: AC
  Administered 2015-10-09: 3.375 g via INTRAVENOUS
  Filled 2015-10-09: qty 50

## 2015-10-09 MED ORDER — NITROGLYCERIN 2 % TD OINT
1.0000 [in_us] | TOPICAL_OINTMENT | Freq: Once | TRANSDERMAL | Status: AC
Start: 2015-10-09 — End: 2015-10-09
  Administered 2015-10-09: 1 [in_us] via TOPICAL
  Filled 2015-10-09: qty 1

## 2015-10-09 MED ORDER — ATORVASTATIN CALCIUM 80 MG PO TABS
80.0000 mg | ORAL_TABLET | Freq: Every day | ORAL | Status: DC
  Administered 2015-10-09 – 2015-10-21 (×12): 80 mg via ORAL
  Filled 2015-10-09 (×13): qty 1

## 2015-10-09 MED ORDER — AMLODIPINE BESYLATE 10 MG PO TABS
10.0000 mg | ORAL_TABLET | Freq: Every day | ORAL | Status: DC
  Administered 2015-10-10: 10 mg via ORAL
  Filled 2015-10-09: qty 2
  Filled 2015-10-09: qty 1

## 2015-10-09 MED ORDER — ACETAMINOPHEN 325 MG PO TABS: 650.0000 mg | ORAL_TABLET | ORAL | Status: DC | PRN

## 2015-10-09 MED ORDER — LISINOPRIL 40 MG PO TABS
40.0000 mg | ORAL_TABLET | Freq: Every day | ORAL | Status: DC
  Administered 2015-10-10: 40 mg via ORAL
  Filled 2015-10-09: qty 4
  Filled 2015-10-09: qty 1

## 2015-10-09 MED ORDER — PNEUMOCOCCAL VAC POLYVALENT 25 MCG/0.5ML IJ INJ: 0.5000 mL | INJECTION | INTRAMUSCULAR | Status: DC

## 2015-10-09 MED ORDER — CEFEPIME HCL 1 G IJ SOLR
1.0000 g | Freq: Three times a day (TID) | INTRAMUSCULAR | Status: DC
  Administered 2015-10-09 (×2): 1 g via INTRAVENOUS
  Filled 2015-10-09 (×4): qty 1

## 2015-10-09 MED ORDER — CLOPIDOGREL BISULFATE 75 MG PO TABS
75.0000 mg | ORAL_TABLET | Freq: Every day | ORAL | Status: DC
  Administered 2015-10-09 – 2015-10-11 (×3): 75 mg via ORAL
  Filled 2015-10-09 (×3): qty 1

## 2015-10-09 MED ORDER — DEXTROSE 5 % IV SOLN
2.0000 g | INTRAVENOUS | Status: DC
  Administered 2015-10-09 – 2015-10-14 (×6): 2 g via INTRAVENOUS
  Filled 2015-10-09 (×6): qty 2

## 2015-10-09 MED ORDER — GABAPENTIN 300 MG PO CAPS
300.0000 mg | ORAL_CAPSULE | Freq: Three times a day (TID) | ORAL | Status: DC
  Administered 2015-10-09 – 2015-10-22 (×37): 300 mg via ORAL
  Filled 2015-10-09 (×38): qty 1

## 2015-10-09 MED ORDER — SODIUM CHLORIDE 0.9 % IV SOLN
1250.0000 mg | INTRAVENOUS | Status: DC
  Filled 2015-10-09 (×2): qty 1250

## 2015-10-09 MED ORDER — NITROGLYCERIN 0.4 MG SL SUBL: 0.4000 mg | SUBLINGUAL_TABLET | SUBLINGUAL | Status: DC | PRN

## 2015-10-09 MED ORDER — POTASSIUM CHLORIDE IN NACL 20-0.9 MEQ/L-% IV SOLN
INTRAVENOUS | Status: AC
  Administered 2015-10-09: 75 mL/h via INTRAVENOUS
  Filled 2015-10-09: qty 1000

## 2015-10-09 NOTE — Consult Note (Signed)
CARDIOLOGY CONSULT NOTE Referring Physician: Opyd Primary Cardiologist: Gwenlyn Found Reason for Consultation: CP   HPI:  [name] is a 70 y.o. male with PMH of COPD (quit smoking years ago), hypertension, hyperlipidemia, type 2 diabetes mellitus, CAD with stent (2003), and PAD with stenting and femoral-popliteal bypass graft whom we are asked to see for CP.  He was last admitted in 11/16 with PNA and sepsis.   3 weeks ago underwent RLE PCI for claudication. Unable to get femoral access so went through his left arm.   Patient notes that it for approximately 1 week now he has developed chest pain with minimal exertion that improves with rest and NTG. Over the past 2 days, he is also experience episodes of chest pain at rest. The night prior to his presentation, at approximately 8:30 PM, he experienced an episode of very severe central chest pressure with radiation to the left shoulder and arm. He described the pain as a deep ache, severe in intensity, occurring at rest, and with no alleviating factors identified. States that the episode lasted approximately 30 minutes before subsiding. He then woke this morning at approximately 1:30 AM with a recurrence in his chest pain, again radiating to the left shoulder and arm. Symptoms lasted approximately 30 minutes again before subsiding without any particular intervention. Patient describes these symptoms as very similar to those experienced in the early 2000s when he required stent placement. He has not had a cath since 2003. Symptoms were accompanied by a sense of impending doom and the patient told his family that he thought he was dying of a heart attack.  In ED, patient was found to be afebrile, saturating well on room air, with heart rate in the low 100s, and blood pressure stable. EKG reveals a sinus tachycardia with rate 105 without ST-T wave abnormalities. WBC 15.1k. Chest x-ray features a mild right mid lung opacity suggestive of pneumonia. Initial  blood work returns notable for serum creatinine of 1.47, up from an apparent baseline of 1.2. Started on broad spectrum abx and transferred here for further care.   Currently CP free. Says he belly feels full. Has been having watery diarrhea. No CHF symptoms or bleeding.   Review of Systems:     Cardiac Review of Systems: {Y] = yes [ ]  = no  Chest Pain [ y   ]  Resting SOB [ y  ] Exertional SOB  [ y ]  Orthopnea [  ]   Pedal Edema [   ]    Palpitations [  ] Syncope  [  ]   Presyncope [   ]  General Review of Systems: [Y] = yes [  ]=no Constitional: recent weight change [  ]; anorexia [  ]; fatigue [  ]; nausea [  ]; night sweats [  ]; fever [  ]; or chills [  ];                                                                     Eyes : blurred vision [  ]; diplopia [   ]; vision changes [  ];  Amaurosis fugax[  ]; Resp: cough [  ];  wheezing[  ];  hemoptysis[  ];  PND [  ];  GI:  gallstones[  ], vomiting[  ];  dysphagia[  ]; melena[  ];  hematochezia [  ]; heartburn[  ];   GU: kidney stones [  ]; hematuria[  ];   dysuria [  ];  nocturia[  ]; incontinence [  ];             Skin: rash, swelling[  ];, hair loss[  ];  peripheral edema[  ];  or itching[  ]; Musculosketetal: myalgias[  ];  joint swelling[  ];  joint erythema[  ];  joint pain[  ];  back pain[  ];  Heme/Lymph: bruising[  ];  bleeding[  ];  anemia[  ];  Neuro: TIA[  ];  headaches[  ];  stroke[  ];  vertigo[  ];  seizures[  ];   paresthesias[  ];  difficulty walking[  ];  Psych:depression[  ]; anxiety[  ];  Endocrine: diabetes[  y];  thyroid dysfunction[  ];  Other:  Past Medical History  Diagnosis Date  . Hypertension   . Hypercholesteremia   . GERD (gastroesophageal reflux disease)   . Diabetes mellitus without complication ([place])   . Neuropathy ([place])   . Peripheral vascular disease ([place])     Medications Prior to Admission  Medication Sig Dispense Refill  . amLODipine (NORVASC) 10 MG tablet Take 10 mg by mouth daily.    [name]  aspirin 81 MG tablet Take 81 mg by mouth daily.    . Cholecalciferol (VITAMIN D) 2000 UNITS tablet Take 2,000 Units by mouth daily.    . clopidogrel (PLAVIX) 75 MG tablet Take 75 mg by mouth daily with breakfast.    . ferrous sulfate 325 (65 FE) MG tablet Take 325-650 mg by mouth 2 (two) times daily with a meal. 650mg  in the morning and 325mg  in the evening    . gabapentin (NEURONTIN) 300 MG capsule Take 300 mg by mouth 3 (three) times daily.     [name] glipiZIDE (GLUCOTROL) 5 MG tablet Take 10 mg by mouth 2 (two) times daily before a meal.     . hydrochlorothiazide (HYDRODIURIL) 25 MG tablet Take 25 mg by mouth daily.    [name] lisinopril (PRINIVIL,ZESTRIL) 40 MG tablet Take 40 mg by mouth daily.    . metFORMIN (GLUCOPHAGE) 1000 MG tablet Take 1,000 mg by mouth 2 (two) times daily with a meal.    . pantoprazole (PROTONIX) 40 MG tablet Take 40 mg by mouth daily.    . simvastatin (ZOCOR) 40 MG tablet Take 20 mg by mouth at bedtime.     [name] levofloxacin (LEVAQUIN) 500 MG tablet Take 1 tablet (500 mg total) by mouth daily. (Patient not taking: Reported on 10/09/2015) 4 tablet 0     . amLODipine  10 mg Oral Daily  . aspirin EC  81 mg Oral Daily  . ceFEPime (MAXIPIME) IV  2 g Intravenous Q24H  . cholecalciferol  2,000 Units Oral Daily  . clopidogrel  75 mg Oral Q breakfast  . ferrous sulfate  325 mg Oral QPM  . [START ON 10/10/2015] ferrous sulfate  650 mg Oral Q breakfast  . gabapentin  300 mg Oral TID  . heparin  5,000 Units Subcutaneous 3 times per day  . hydrochlorothiazide  25 mg Oral Daily  . insulin aspart  0-9 Units Subcutaneous 6 times per day  . lisinopril  40 mg Oral Daily  . metoprolol tartrate  12.5 mg Oral BID  . pantoprazole  40 mg Oral Daily  . simvastatin  20 mg Oral QHS  . vancomycin  750 mg Intravenous Q12H    Infusions: . 0.9 % NaCl with KCl 20 mEq / L 75 mL/hr (10/09/15 0722)    No Known Allergies  Social History   Social History  . Marital Status: Married    Spouse Name:  N/A  . Number of Children: N/A  . Years of Education: N/A   Occupational History  . Not on file.   Social History Main Topics  . Smoking status: Former Smoker -- 2.00 packs/day for 30 years    Types: Cigarettes  . Smokeless tobacco: Not on file  . Alcohol Use: No  . Drug Use: No  . Sexual Activity: Not on file   Other Topics Concern  . Not on file   Social History Narrative    Family History  Problem Relation Age of Onset  . Colon cancer Neg Hx     PHYSICAL EXAM: Filed Vitals:   10/09/15 1615 10/09/15 1718  BP: 133/64 125/67  Pulse: 84 80  Temp: 98 F (36.7 C) 97.7 F (36.5 C)  Resp: 20 20    No intake or output data in the 24 hours ending 10/09/15 1747  General:  Lying flat in ped. No respiratory difficulty HEENT: normal Neck: supple. no JVD. Carotids 2+ bilat; no bruits. No lymphadenopathy or thryomegaly appreciated. Cor: PMI nondisplaced. Regular rate & rhythm. No rubs, gallops or murmurs. Lungs: clear with decreased BS throughout Abdomen: soft, nontender, nondistended. No hepatosplenomegaly. No bruits or masses. Good bowel sounds. Extremities: no cyanosis, clubbing, rash, edema Neuro: alert & oriented x 3, cranial nerves grossly intact. moves all 4 extremities w/o difficulty. Affect pleasant.  ECG: Sinus tach 105 No ST-T wave abnormalities.    Results for orders placed or performed during the hospital encounter of 10/09/15 (from the past 24 hour(s))  CBC with Differential     Status: Abnormal   Collection Time: 10/09/15  3:40 AM  Result Value Ref Range   WBC 15.1 (H) 4.0 - 10.5 K/uL   RBC 3.82 (L) 4.22 - 5.81 MIL/uL   Hemoglobin 10.7 (L) 13.0 - 17.0 g/dL   HCT 32.4 (L) 39.0 - 52.0 %   MCV 84.8 78.0 - 100.0 fL   MCH 28.0 26.0 - 34.0 pg   MCHC 33.0 30.0 - 36.0 g/dL   RDW 15.0 11.5 - 15.5 %   Platelets 435 (H) 150 - 400 K/uL   Neutrophils Relative % 81 %   Neutro Abs 12.3 (H) 1.7 - 7.7 K/uL   Lymphocytes Relative 11 %   Lymphs Abs 1.6 0.7 - 4.0 K/uL    Monocytes Relative 7 %   Monocytes Absolute 1.0 0.1 - 1.0 K/uL   Eosinophils Relative 1 %   Eosinophils Absolute 0.1 0.0 - 0.7 K/uL   Basophils Relative 0 %   Basophils Absolute 0.0 0.0 - 0.1 K/uL  Comprehensive metabolic panel     Status: Abnormal   Collection Time: 10/09/15  3:40 AM  Result Value Ref Range   Sodium 134 (L) 135 - 145 mmol/L   Potassium 3.6 3.5 - 5.1 mmol/L   Chloride 102 101 - 111 mmol/L   CO2 20 (L) 22 - 32 mmol/L   Glucose, Bld 97 65 - 99 mg/dL   BUN 23 (H) 6 - 20 mg/dL   Creatinine, Ser 1.47 (H) 0.61 - 1.24 mg/dL   Calcium 8.6 (L) 8.9 - 10.3 mg/dL   Total Protein 7.2 6.5 - 8.1 g/dL   Albumin  3.7 3.5 - 5.0 g/dL   AST 16 15 - 41 U/L   ALT 11 (L) 17 - 63 U/L   Alkaline Phosphatase 59 38 - 126 U/L   Total Bilirubin 0.2 (L) 0.3 - 1.2 mg/dL   GFR calc non Af Amer 47 (L) >60 mL/min   GFR calc Af Amer 55 (L) >60 mL/min   Anion gap 12 5 - 15  Troponin I     Status: None   Collection Time: 10/09/15  3:40 AM  Result Value Ref Range   Troponin I <0.03 <0.031 ng/mL  Culture, blood (routine x 2) Call MD if unable to obtain prior to antibiotics being given     Status: None (Preliminary result)   Collection Time: 10/09/15  7:18 AM  Result Value Ref Range   Specimen Description BLOOD RIGHT ARM    Special Requests BOTTLES DRAWN AEROBIC AND ANAEROBIC 6CC    Culture PENDING    Report Status PENDING   Brain natriuretic peptide     Status: None   Collection Time: 10/09/15  7:18 AM  Result Value Ref Range   B Natriuretic Peptide 15.0 0.0 - 100.0 pg/mL  Protime-INR     Status: None   Collection Time: 10/09/15  7:18 AM  Result Value Ref Range   Prothrombin Time 14.1 11.6 - 15.2 seconds   INR 1.07 0.00 - 1.49  APTT     Status: None   Collection Time: 10/09/15  7:18 AM  Result Value Ref Range   aPTT 30 24 - 37 seconds  Troponin I     Status: None   Collection Time: 10/09/15  7:18 AM  Result Value Ref Range   Troponin I <0.03 <0.031 ng/mL  Lactic acid, plasma      Status: None   Collection Time: 10/09/15  7:18 AM  Result Value Ref Range   Lactic Acid, Venous 1.6 0.5 - 2.0 mmol/L  Magnesium     Status: Abnormal   Collection Time: 10/09/15  7:18 AM  Result Value Ref Range   Magnesium 1.4 (L) 1.7 - 2.4 mg/dL  Procalcitonin - Baseline     Status: None   Collection Time: 10/09/15  7:18 AM  Result Value Ref Range   Procalcitonin <0.10 ng/mL  Culture, blood (routine x 2) Call MD if unable to obtain prior to antibiotics being given     Status: None (Preliminary result)   Collection Time: 10/09/15  7:22 AM  Result Value Ref Range   Specimen Description BLOOD LEFT ARM    Special Requests BOTTLES DRAWN AEROBIC AND ANAEROBIC 6CC    Culture PENDING    Report Status PENDING   Troponin I     Status: None   Collection Time: 10/09/15 10:21 AM  Result Value Ref Range   Troponin I <0.03 <0.031 ng/mL  Lactic acid, plasma     Status: None   Collection Time: 10/09/15 10:21 AM  Result Value Ref Range   Lactic Acid, Venous 1.3 0.5 - 2.0 mmol/L  POC CBG, ED     Status: Abnormal   Collection Time: 10/09/15  1:00 PM  Result Value Ref Range   Glucose-Capillary 141 (H) 65 - 99 mg/dL   Dg Chest Portable 1 View  10/09/2015  CLINICAL DATA:  Acute onset of midsternal chest pain, radiating to the left arm. Initial encounter. EXAM: PORTABLE CHEST 1 VIEW COMPARISON:  Chest radiograph performed 07/09/2015 FINDINGS: The lungs are well-aerated. Mild right midlung zone airspace opacity could reflect mild pneumonia. There  is no evidence of pleural effusion or pneumothorax. The cardiomediastinal silhouette is borderline normal in size. No acute osseous abnormalities are seen. IMPRESSION: Mild right midlung zone airspace opacity raises concern for mild pneumonia. If the patient has symptoms of pneumonia, followup PA and lateral chest X-ray is recommended in 3-4 weeks following trial of antibiotic therapy to ensure resolution. Otherwise, CT of the chest could be considered.  Electronically Signed   By: Garald Balding M.D.   On: 10/09/2015 03:11    ASSESSMENT: 1. Chest pain concerning for [place] 2. Pneumonia 3. CAD s/p stent 2003 4. PAD 5. DM2 6. COPD  7. AKI   PLAN/DISCUSSION:  CP very concerning for [place] in setting of current PNA. Fortunately cardiac markers and ECG are ok. Continue treatment for PNA per primary team. We will plan cardiac cath on Monday. Would treat with ASA, b-blocker and statin. If has recurrent CP add heparin. Will get echo tomorrow. Hold metformin. He is already on Plavix for recent LE stent. If has surgical CAD will need to washout.    Orena Cavazos,MD 5:54 PM

## 2015-10-09 NOTE — ED Notes (Signed)
Pt states for over a week he has had intermittent midsternal chest pain with radiation to his left arm,  States he was seen in [place] on Wednesday and had an ekg and blood work done.  Pt states he was not told the results.

## 2015-10-09 NOTE — Progress Notes (Signed)
Pharmacy Antibiotic Note  ZYQUEZ SCARFONE is a 70 y.o. male admitted on 10/09/2015 with pneumonia.  Pharmacy has been consulted for vancomycin/cefepime dosing. Given Vanc 1g/Zosyn x1 dose this AM at [facility]. SCr 1.47, CrCl~45. Afeb, wbc 15.1  Plan: Vanc 750mg  IV q12h Cefepime 2g IV q24 - f/u renal function, may need to adjust up Monitor clinical progress, c/s, renal function, abx plan/LOT VT@SS  as indicated  Height: 5\' 7"  (170.2 cm) Weight: 154 lb (69.854 kg) IBW/kg (Calculated) : 66.1  Temp (24hrs), Avg:98.1 F (36.7 C), Min:98 F (36.7 C), Max:98.2 F (36.8 C)   Recent Labs Lab 10/09/15 0340 10/09/15 0718 10/09/15 1021  WBC 15.1*  --   --   CREATININE 1.47*  --   --   LATICACIDVEN  --  1.6 1.3    Estimated Creatinine Clearance: 45 mL/min (by C-G formula based on Cr of 1.47).    No Known Allergies  Antimicrobials this admission: 2/25 vanc >>  2/25 cefepime >>  2/25 zosyn x1  Dose adjustments this admission: n/a  Microbiology results: 2/25 BCx:   2/25 Sputum:    Elicia Lamp, PharmD, [facility] Clinical Pharmacist Pager [phone] 10/09/2015 5:21 PM

## 2015-10-09 NOTE — ED Notes (Signed)
Insufficient quantity to administer Lisinopril.  Other orders not verified for administration.

## 2015-10-09 NOTE — ED Notes (Signed)
[facility] called and received report

## 2015-10-09 NOTE — ED Provider Notes (Signed)
CSN: [id number]     Arrival date & time 10/09/15  0211 History   First MD Initiated Contact with Patient 10/09/15 0405     Chief Complaint  Patient presents with  . Chest Pain     (Consider location/radiation/quality/duration/timing/severity/associated sxs/prior Treatment) HPI patient reports he had a cardiac stent placed in 2003 by Dr. Gwenlyn Found. He has not had chest pain until about a week ago when he started having chest pain that has been exertional but also can occur at rest. It has been lasting about 20 minutes. Sometimes it improves if he sits down and rests. He states last night about 8 PM as he was going to bed he had pain in his chest. Indicates the pain is in the center upper portion of his chest. He states it feels like it has someone sitting on his chest and the pain radiates into his left arm. He gets shortness of breath with it but he denies nausea, vomiting, or diaphoresis. He states tonight the 2 episodes lasted about 30 minutes. The second episode happened about 1:30 AM and the pain was gone by the time he got to the ED. He had told his family "that was it" meaning he thought he was going to die tonight. He also reports she's had a cough for couple weeks. One week ago he had fever and chills. He states he was diagnosed with pneumonia and was admitted to the hospital at the end of November and he had a CT of the chest done afterwards at the [facility] which showed the pneumonia had cleared.   PCP [facility]  Past Medical History  Diagnosis Date  . Hypertension   . Hypercholesteremia   . GERD (gastroesophageal reflux disease)   . Diabetes mellitus without complication ([place])   . Neuropathy ([place])   . Peripheral vascular disease [facility])    Past Surgical History  Procedure Laterality Date  . Cardiac catheterization  2003    with stent  . Femoral-popliteal bypass graft    . Colonoscopy N/A 09/22/2013    Procedure: COLONOSCOPY;  Surgeon: Daneil Dolin, MD;  Location: [facility];   Service: Endoscopy;  Laterality: N/A;  9:30 AM   Family History  Problem Relation Age of Onset  . Colon cancer Neg Hx    Social History  Substance Use Topics  . Smoking status: Former Smoker -- 2.00 packs/day for 30 years    Types: Cigarettes  . Smokeless tobacco: None  . Alcohol Use: No  lives at home Lives with spouse  Review of Systems    Allergies  Review of patient's allergies indicates no known allergies.  Home Medications   Prior to Admission medications   Medication Sig Start Date End Date Taking? Authorizing Provider  amLODipine (NORVASC) 10 MG tablet Take 10 mg by mouth daily.   Yes Historical Provider, MD  aspirin 81 MG tablet Take 81 mg by mouth daily.   Yes Historical Provider, MD  Cholecalciferol (VITAMIN D) 2000 UNITS tablet Take 2,000 Units by mouth daily.   Yes Historical Provider, MD  clopidogrel (PLAVIX) 75 MG tablet Take 75 mg by mouth daily with breakfast.   Yes Historical Provider, MD  ferrous sulfate 325 (65 FE) MG tablet Take 325-650 mg by mouth 2 (two) times daily with a meal. 650mg  in the morning and 325mg  in the evening   Yes Historical Provider, MD  gabapentin (NEURONTIN) 300 MG capsule Take 300 mg by mouth 3 (three) times daily.    Yes Historical  Provider, MD  glipiZIDE (GLUCOTROL) 5 MG tablet Take 10 mg by mouth 2 (two) times daily before a meal.    Yes Historical Provider, MD  hydrochlorothiazide (HYDRODIURIL) 25 MG tablet Take 25 mg by mouth daily.   Yes Historical Provider, MD  lisinopril (PRINIVIL,ZESTRIL) 40 MG tablet Take 40 mg by mouth daily.   Yes Historical Provider, MD  metFORMIN (GLUCOPHAGE) 1000 MG tablet Take 1,000 mg by mouth 2 (two) times daily with a meal.   Yes Historical Provider, MD  pantoprazole (PROTONIX) 40 MG tablet Take 40 mg by mouth daily.   Yes Historical Provider, MD  simvastatin (ZOCOR) 40 MG tablet Take 20 mg by mouth at bedtime.    Yes Historical Provider, MD  levofloxacin (LEVAQUIN) 500 MG tablet Take 1 tablet (500 mg  total) by mouth daily. 07/12/15   [name], MD   BP 126/54 mmHg  Pulse 100  Temp(Src) 98.2 F (36.8 C) (Oral)  Resp 8  Ht 5\' 7"  (1.702 m)  Wt 154 lb (69.854 kg)  BMI 24.11 kg/m2  SpO2 97%  Vital signs normal   Physical Exam  Constitutional: He is oriented to person, place, and time. He appears well-developed and well-nourished.  Non-toxic appearance. He does not appear ill. No distress.  HENT:  Head: Normocephalic and atraumatic.  Right Ear: External ear normal.  Left Ear: External ear normal.  Nose: Nose normal. No mucosal edema or rhinorrhea.  Mouth/Throat: Oropharynx is clear and moist and mucous membranes are normal. No dental abscesses or uvula swelling.  Eyes: Conjunctivae and EOM are normal. Pupils are equal, round, and reactive to light.  Neck: Normal range of motion and full passive range of motion without pain. Neck supple.  Cardiovascular: Normal rate, regular rhythm and normal heart sounds.  Exam reveals no gallop and no friction rub.   No murmur heard. Pulmonary/Chest: Effort normal and breath sounds normal. No respiratory distress. He has no wheezes. He has no rhonchi. He has no rales. He exhibits no tenderness and no crepitus.    Area of chest pain noted.  Abdominal: Soft. Normal appearance and bowel sounds are normal. He exhibits no distension. There is no tenderness. There is no rebound and no guarding.  Musculoskeletal: Normal range of motion. He exhibits no edema or tenderness.  Moves all extremities well.   Neurological: He is alert and oriented to person, place, and time. He has normal strength. No cranial nerve deficit.  Skin: Skin is warm, dry and intact. No rash noted. No erythema. No pallor.  Psychiatric: He has a normal mood and affect. His speech is normal and behavior is normal. His mood appears not anxious.  Nursing note and vitals reviewed.   ED Course  Procedures (including critical care time)  Medications  vancomycin (VANCOCIN)  IVPB 1000 mg/200 mL premix (not administered)  piperacillin-tazobactam (ZOSYN) IVPB 3.375 g (3.375 g Intravenous New Bag/Given 10/09/15 0442)  nitroGLYCERIN (NITROGLYN) 2 % ointment 1 inch (1 inch Topical Given 10/09/15 0441)  aspirin chewable tablet 324 mg (324 mg Oral Given 10/09/15 0441)   Patient was given nitroglycerin paste and aspirin to chew. He was pain-free at the time of my exam. He was informed to let the nurse know if he gets pain again.  Patient had been in the hospital and discharged November 28 for pneumonia. He is right at 3 months since his last pneumonia. He was started on healthcare associated antibiotics.  05:04 AMPatient was discussed with the cardiologist on call,Dr Eula Fried, who wants  medicine to admit patient, recommends coming to [facility] since no cardiology available here for another 2 days  05:15 AM Dr Myna Hidalgo, will see patient, admit to tele, [place], Dr Fuller Plan attending  Patient presents with 2 problems, he has pneumonia with history of cough for couple weeks with fever and chills last week. However he also has a history of chest pain that is very suspicious for being coronary artery disease. Patient does have a history of a prior stent that was done about 14 years ago. His family report he had to 30% blockages at that time. Patient is being admitted for treatment of his pneumonia and also for evaluation by cardiology.  Labs Review Results for orders placed or performed during the hospital encounter of 10/09/15  CBC with Differential  Result Value Ref Range   WBC 15.1 (H) 4.0 - 10.5 K/uL   RBC 3.82 (L) 4.22 - 5.81 MIL/uL   Hemoglobin 10.7 (L) 13.0 - 17.0 g/dL   HCT 32.4 (L) 39.0 - 52.0 %   MCV 84.8 78.0 - 100.0 fL   MCH 28.0 26.0 - 34.0 pg   MCHC 33.0 30.0 - 36.0 g/dL   RDW 15.0 11.5 - 15.5 %   Platelets 435 (H) 150 - 400 K/uL   Neutrophils Relative % 81 %   Neutro Abs 12.3 (H) 1.7 - 7.7 K/uL   Lymphocytes Relative 11 %   Lymphs Abs 1.6 0.7 - 4.0 K/uL   Monocytes  Relative 7 %   Monocytes Absolute 1.0 0.1 - 1.0 K/uL   Eosinophils Relative 1 %   Eosinophils Absolute 0.1 0.0 - 0.7 K/uL   Basophils Relative 0 %   Basophils Absolute 0.0 0.0 - 0.1 K/uL  Comprehensive metabolic panel  Result Value Ref Range   Sodium 134 (L) 135 - 145 mmol/L   Potassium 3.6 3.5 - 5.1 mmol/L   Chloride 102 101 - 111 mmol/L   CO2 20 (L) 22 - 32 mmol/L   Glucose, Bld 97 65 - 99 mg/dL   BUN 23 (H) 6 - 20 mg/dL   Creatinine, Ser 1.47 (H) 0.61 - 1.24 mg/dL   Calcium 8.6 (L) 8.9 - 10.3 mg/dL   Total Protein 7.2 6.5 - 8.1 g/dL   Albumin 3.7 3.5 - 5.0 g/dL   AST 16 15 - 41 U/L   ALT 11 (L) 17 - 63 U/L   Alkaline Phosphatase 59 38 - 126 U/L   Total Bilirubin 0.2 (L) 0.3 - 1.2 mg/dL   GFR calc non Af Amer 47 (L) >60 mL/min   GFR calc Af Amer 55 (L) >60 mL/min   Anion gap 12 5 - 15  Troponin I  Result Value Ref Range   Troponin I <0.03 <0.031 ng/mL     Laboratory interpretation all normal except renal insufficiency, leukocytosis   Imaging Review Dg Chest Portable 1 View  10/09/2015  CLINICAL DATA:  Acute onset of midsternal chest pain, radiating to the left arm. Initial encounter. EXAM: PORTABLE CHEST 1 VIEW COMPARISON:  Chest radiograph performed 07/09/2015 FINDINGS: The lungs are well-aerated. Mild right midlung zone airspace opacity could reflect mild pneumonia. There is no evidence of pleural effusion or pneumothorax. The cardiomediastinal silhouette is borderline normal in size. No acute osseous abnormalities are seen. IMPRESSION: Mild right midlung zone airspace opacity raises concern for mild pneumonia. If the patient has symptoms of pneumonia, followup PA and lateral chest X-ray is recommended in 3-4 weeks following trial of antibiotic therapy to ensure resolution. Otherwise, CT  of the chest could be considered. Electronically Signed   By: Garald Balding M.D.   On: 10/09/2015 03:11   I have personally reviewed and evaluated these images and lab results as part of my  medical decision-making.  ED ECG REPORT   Date: 10/09/2015  Rate: 105  Rhythm: sinus tachycardia  QRS Axis: normal  Intervals: normal  ST/T Wave abnormalities: normal  Conduction Disutrbances:none  Narrative Interpretation:   Old EKG Reviewed: none available  I have personally reviewed the EKG tracing and agree with the computerized printout as noted.   MDM   Final diagnoses:  Chest pain, unspecified chest pain type  Healthcare-associated pneumonia    Plan transfer to [facility] for admission   Rolland Porter, MD, Barbette Or, MD 10/09/15 [phone]

## 2015-10-09 NOTE — H&P (Signed)
[facility] History and Physical  MIHAI VERZOSA [id number] DOB: 1946-06-05 DOA: 10/09/2015  Referring physician: ED physician PCP: No primary care provider on file.  Specialists:  Dr. Gwenlyn Found (cardiology)   Chief Complaint:  Chest pain   HPI: [name] is a 70 y.o. male with PMH of hypertension, hyperlipidemia, type 2 diabetes mellitus, CAD with stent, and PAD with stenting and femoral-popliteal bypass graft who presents the ED with 1 week of intermittent chest pain. Patient notes that it for approximately 1 week now he has developed chest pain with minimal exertion that improves with rest. Over the past 2 days, he is also experience episodes of chest pain at rest. The night prior to his presentation, at approximately 8:30 PM, he experienced an episode of severe central chest pressure with radiation to the left shoulder and arm. He described the pain as a deep ache, severe in intensity, occurring at rest, and with no alleviating factors identified. States that the episode lasted approximately 30 minutes before subsiding. He then woke this morning at approximately 1:30 AM with a recurrence in his chest pain, again radiating to the left shoulder and arm. Symptoms lasted approximately 30 minutes again before subsiding without any particular intervention. Patient describes these symptoms as very similar to those experienced in the early 2000s when he required stent placement. Symptoms were accompanied by a sense of impending doom and the patient told his family that he thought he was dying of a heart attack. He was hospitalized approximately 3 months ago for pneumonia, and had initially done quite well back at home from a respiratory perspective. He has not experienced much dyspnea per se over the past week, but has noted a subjective fever.  In ED, patient was found to be afebrile, saturating well on room air, with heart rate in the low 100s, and blood pressure stable. EKG reveals a sinus  tachycardia with rate 105 but is otherwise normal. Chest x-ray features a mild right mid lung opacity suggestive of pneumonia. Initial blood work returns notable for serum creatinine of 1.47, up from an apparent baseline of 1.2. There is a leukocytosis to 15,000 and hemoglobin of 10.7, consistent with the patient's baseline. Platelets are mildly elevated at value of 435,000. Patient was given a 324 mg aspirin chew in the ED, 1 inch of Nitropaste was placed, and empiric vancomycin and Zosyn were administered. Cardiology was consulted from the emergency department and recommended admission to [facility] where cardiology will be available over the coming weekend.  Where does patient live?   At home    Can patient participate in ADLs?  Yes         Review of Systems:   General: no sweats, weight change, poor appetite, or fatigue. Subjective fever, chills yesterday.  HEENT: no blurry vision, hearing changes or sore throat Pulm: no dyspnea, cough, or wheeze CV: no palpitations. Chest pain, recurrent Abd: no nausea, vomiting, abdominal pain, or constipation. Diarrhea GU: no dysuria, hematuria, increased urinary frequency, or urgency  Ext: no leg edema Neuro: no focal weakness, numbness, or tingling, no vision change or hearing loss Skin: no rash, no wounds MSK: No muscle spasm, no deformity, no red, hot, or swollen joint Heme: No easy bruising or bleeding Travel history: No recent long distant travel    Allergy: No Known Allergies  Past Medical History  Diagnosis Date  . Hypertension   . Hypercholesteremia   . GERD (gastroesophageal reflux disease)   . Diabetes mellitus without complication ([place])   .  Neuropathy ([place])   . Peripheral vascular disease [facility])     Past Surgical History  Procedure Laterality Date  . Cardiac catheterization  2003    with stent  . Femoral-popliteal bypass graft    . Colonoscopy N/A 09/22/2013    Procedure: COLONOSCOPY;  Surgeon: Daneil Dolin, MD;  Location: [facility]  ENDO SUITE;  Service: Endoscopy;  Laterality: N/A;  9:30 AM    Social History:  reports that he has quit smoking. His smoking use included Cigarettes. He has a 60 pack-year smoking history. He does not have any smokeless tobacco history on file. He reports that he does not drink alcohol or use illicit drugs.  Family History:  Family History  Problem Relation Age of Onset  . Colon cancer Neg Hx      Prior to Admission medications   Medication Sig Start Date End Date Taking? Authorizing Provider  amLODipine (NORVASC) 10 MG tablet Take 10 mg by mouth daily.   Yes Historical Provider, MD  aspirin 81 MG tablet Take 81 mg by mouth daily.   Yes Historical Provider, MD  Cholecalciferol (VITAMIN D) 2000 UNITS tablet Take 2,000 Units by mouth daily.   Yes Historical Provider, MD  clopidogrel (PLAVIX) 75 MG tablet Take 75 mg by mouth daily with breakfast.   Yes Historical Provider, MD  ferrous sulfate 325 (65 FE) MG tablet Take 325-650 mg by mouth 2 (two) times daily with a meal. 650mg  in the morning and 325mg  in the evening   Yes Historical Provider, MD  gabapentin (NEURONTIN) 300 MG capsule Take 300 mg by mouth 3 (three) times daily.    Yes Historical Provider, MD  glipiZIDE (GLUCOTROL) 5 MG tablet Take 10 mg by mouth 2 (two) times daily before a meal.    Yes Historical Provider, MD  hydrochlorothiazide (HYDRODIURIL) 25 MG tablet Take 25 mg by mouth daily.   Yes Historical Provider, MD  lisinopril (PRINIVIL,ZESTRIL) 40 MG tablet Take 40 mg by mouth daily.   Yes Historical Provider, MD  metFORMIN (GLUCOPHAGE) 1000 MG tablet Take 1,000 mg by mouth 2 (two) times daily with a meal.   Yes Historical Provider, MD  pantoprazole (PROTONIX) 40 MG tablet Take 40 mg by mouth daily.   Yes Historical Provider, MD  simvastatin (ZOCOR) 40 MG tablet Take 20 mg by mouth at bedtime.    Yes Historical Provider, MD  levofloxacin (LEVAQUIN) 500 MG tablet Take 1 tablet (500 mg total) by mouth daily. 07/12/15   [name], MD    Physical Exam: Filed Vitals:   10/09/15 0400 10/09/15 0430 10/09/15 0500 10/09/15 0530  BP: 114/60 126/54 120/66 134/67  Pulse: 94 100 98 84  Temp:      TempSrc:      Resp: 12 8 18 15   Height:      Weight:      SpO2: 96% 97% 94% 97%   General: Not in acute distress HEENT:       Eyes: PERRL, EOMI, no scleral icterus or conjunctival pallor.       ENT: No discharge from the ears or nose, no pharyngeal ulcers, petechiae or exudate.        Neck: No JVD, no bruit, no appreciable mass Heme: No cervical adenopathy, no pallor Cardiac: S1/S2, RRR, no appreciable murmur, No gallops or rubs. Pulm: Good air movement bilaterally. No rales, wheezing, rhonchi or rubs. Abd: Soft, nondistended, nontender, no rebound pain or gaurding, no mass or organomegaly, BS present. Ext: No LE edema bilaterally.  Trace DP/PT pulse bilaterally. Musculoskeletal: No gross deformity, no red, hot, swollen joints  Skin: No rashes or wounds on exposed surfaces  Neuro: Alert, oriented X3, cranial nerves II-XII grossly intact. No focal findings Psych: Patient is not overtly psychotic, appropriate mood and affect.  Labs on Admission:  Basic Metabolic Panel:  Recent Labs Lab 10/09/15 0340  NA 134*  K 3.6  CL 102  CO2 20*  GLUCOSE 97  BUN 23*  CREATININE 1.47*  CALCIUM 8.6*   Liver Function Tests:  Recent Labs Lab 10/09/15 0340  AST 16  ALT 11*  ALKPHOS 59  BILITOT 0.2*  PROT 7.2  ALBUMIN 3.7   No results for input(s): LIPASE, AMYLASE in the last 168 hours. No results for input(s): AMMONIA in the last 168 hours. CBC:  Recent Labs Lab 10/09/15 0340  WBC 15.1*  NEUTROABS 12.3*  HGB 10.7*  HCT 32.4*  MCV 84.8  PLT 435*   Cardiac Enzymes:  Recent Labs Lab 10/09/15 0340  TROPONINI <0.03    BNP (last 3 results) No results for input(s): BNP in the last 8760 hours.  ProBNP (last 3 results) No results for input(s): PROBNP in the last 8760 hours.  CBG: No results  for input(s): GLUCAP in the last 168 hours.  Radiological Exams on Admission: Dg Chest Portable 1 View  10/09/2015  CLINICAL DATA:  Acute onset of midsternal chest pain, radiating to the left arm. Initial encounter. EXAM: PORTABLE CHEST 1 VIEW COMPARISON:  Chest radiograph performed 07/09/2015 FINDINGS: The lungs are well-aerated. Mild right midlung zone airspace opacity could reflect mild pneumonia. There is no evidence of pleural effusion or pneumothorax. The cardiomediastinal silhouette is borderline normal in size. No acute osseous abnormalities are seen. IMPRESSION: Mild right midlung zone airspace opacity raises concern for mild pneumonia. If the patient has symptoms of pneumonia, followup PA and lateral chest X-ray is recommended in 3-4 weeks following trial of antibiotic therapy to ensure resolution. Otherwise, CT of the chest could be considered. Electronically Signed   By: Garald Balding M.D.   On: 10/09/2015 03:11    EKG: Independently reviewed.  Abnormal findings:  Sinus tachycardia (rate 105), otherwise normal    Assessment/Plan  1. Chest pain  - Pt has multiple risk factors and known CAD; sxs concerning for unstable angina  - Initial EKG does not feature ischemic changes; initial troponin is <0.03  - There is an apparent PNA on CXR, but reported CP symptoms are not pleuritic  - Cardiology has been consulted from the ED, recommending admission to [facility]  - Monitor on telemetry for ischemic changes  - Obtain serial troponin measurements overnight  - Repeat EKG in am, sooner for CP recurrence  - ASA 324 mg chew given in ED; statin taken at home PTA; metoprolol IR given now   2. Sepsis secondary to HCAP - Meets sepsis criteria on arrival with tachycardia and leukocytosis; source is pulmonary, organism not yet known  - Empiric vancomycin and Zosyn administered in ED  - Will continue empiric broad spectrums with vanc and cefepime; will tailor according to culture data and clinical  progress  - Sputum culture requested  - Trend lactate, procalcitonin  - Check urine for strep pneumo antigens  - Gentle IVF hydration    3. ASCVD  - Pt with known CAD, stent placed in 2003; known PAD with multiple angioplasty/stenting, fem-pop bypass  - Continue daily ASA 81, Plavix, statin, ACEi  - Concern for unstable angina as above  - Optimize  risk factors   4. HTN  - At goal currently  - Has 1" nitro paste placed in ED  - Given Lopressor 12.5 mg tonight in setting of potential ACS  - Cautiously continue home-dose Norvasc, lisinopril as tolerated   5. Type II DM  - A1c was 9.0% in 2008 - Hold home glipizide and metformin while inpt  - CBG q4h, change to with meals and qHS once eating  - Low-intensity SSI correctional to start with, adjust prn  - Carb-modified diet when appropriate  - Check A1c, pending    6. CKD stage II  - SCr 1.47 on admission, up from apparent baseline of ~1.2  - Small bump likely secondary to active infection and subsequent dehydration  - Anticipate improvement with gentle IVF hydration  - Repeat chem panel tomorrow  - Follow fluid status closely, intervene prn  - Avoid nephrotoxins where possible     DVT ppx: SQ Heparin     Code Status: Full code Family Communication:  Yes, patient's wife at bed side Disposition Plan: Admit to inpatient   Date of Service 10/09/2015    Vianne Bulls, MD [facility] Pager [phone]  If 7PM-7AM, please contact night-coverage [email or web address] Password [facility] 10/09/2015, 5:46 AM

## 2015-10-10 ENCOUNTER — Inpatient Hospital Stay (HOSPITAL_COMMUNITY): Payer: Non-veteran care

## 2015-10-10 DIAGNOSIS — R079 Chest pain, unspecified: Secondary | ICD-10-CM

## 2015-10-10 DIAGNOSIS — I200 Unstable angina: Secondary | ICD-10-CM

## 2015-10-10 LAB — URINALYSIS, ROUTINE W REFLEX MICROSCOPIC
BILIRUBIN URINE: NEGATIVE
GLUCOSE, UA: 100 mg/dL — AB
HGB URINE DIPSTICK: NEGATIVE
Ketones, ur: NEGATIVE mg/dL
Leukocytes, UA: NEGATIVE
Nitrite: NEGATIVE
Protein, ur: NEGATIVE mg/dL
SPECIFIC GRAVITY, URINE: 1.014 (ref 1.005–1.030)
pH: 6 (ref 5.0–8.0)

## 2015-10-10 LAB — GLUCOSE, CAPILLARY
GLUCOSE-CAPILLARY: 161 mg/dL — AB (ref 65–99)
GLUCOSE-CAPILLARY: 164 mg/dL — AB (ref 65–99)
GLUCOSE-CAPILLARY: 172 mg/dL — AB (ref 65–99)
GLUCOSE-CAPILLARY: 179 mg/dL — AB (ref 65–99)
Glucose-Capillary: 190 mg/dL — ABNORMAL HIGH (ref 65–99)

## 2015-10-10 LAB — C DIFFICILE QUICK SCREEN W PCR REFLEX
C DIFFICILE (CDIFF) INTERP: NEGATIVE
C DIFFICLE (CDIFF) ANTIGEN: NEGATIVE
C Diff toxin: NEGATIVE

## 2015-10-10 LAB — COMPREHENSIVE METABOLIC PANEL
ALT: 11 U/L — ABNORMAL LOW (ref 17–63)
AST: 16 U/L (ref 15–41)
Albumin: 3.2 g/dL — ABNORMAL LOW (ref 3.5–5.0)
Alkaline Phosphatase: 57 U/L (ref 38–126)
Anion gap: 12 (ref 5–15)
BUN: 12 mg/dL (ref 6–20)
CHLORIDE: 103 mmol/L (ref 101–111)
CO2: 22 mmol/L (ref 22–32)
CREATININE: 1.37 mg/dL — AB (ref 0.61–1.24)
Calcium: 9.3 mg/dL (ref 8.9–10.3)
GFR, EST AFRICAN AMERICAN: 60 mL/min — AB (ref >60)
GFR, EST NON AFRICAN AMERICAN: 51 mL/min — AB (ref >60)
Glucose, Bld: 162 mg/dL — ABNORMAL HIGH (ref 65–99)
POTASSIUM: 4.6 mmol/L (ref 3.5–5.1)
Sodium: 137 mmol/L (ref 135–145)
TOTAL PROTEIN: 6.5 g/dL (ref 6.5–8.1)
Total Bilirubin: 0.3 mg/dL (ref 0.3–1.2)

## 2015-10-10 LAB — MRSA PCR SCREENING: MRSA BY PCR: NEGATIVE

## 2015-10-10 LAB — HIV ANTIBODY (ROUTINE TESTING W REFLEX): HIV SCREEN 4TH GENERATION: NONREACTIVE

## 2015-10-10 LAB — STREP PNEUMONIAE URINARY ANTIGEN: Strep Pneumo Urinary Antigen: NEGATIVE

## 2015-10-10 MED ORDER — MAGNESIUM SULFATE 4 GM/100ML IV SOLN
4.0000 g | Freq: Once | INTRAVENOUS | Status: AC
  Administered 2015-10-10: 4 g via INTRAVENOUS
  Filled 2015-10-10: qty 100

## 2015-10-10 MED ORDER — SODIUM CHLORIDE 0.9% FLUSH: 3.0000 mL | INTRAVENOUS | Status: DC | PRN

## 2015-10-10 MED ORDER — SODIUM CHLORIDE 0.9 % IV SOLN
INTRAVENOUS | Status: DC
  Administered 2015-10-11: 75 mL/h via INTRAVENOUS

## 2015-10-10 MED ORDER — SODIUM CHLORIDE 0.9% FLUSH
3.0000 mL | Freq: Two times a day (BID) | INTRAVENOUS | Status: DC
  Administered 2015-10-10 – 2015-10-11 (×2): 3 mL via INTRAVENOUS

## 2015-10-10 MED ORDER — ASPIRIN 81 MG PO CHEW
81.0000 mg | CHEWABLE_TABLET | ORAL | Status: AC
  Administered 2015-10-11: 81 mg via ORAL
  Filled 2015-10-10: qty 1

## 2015-10-10 MED ORDER — SODIUM CHLORIDE 0.9 % IV SOLN: 250.0000 mL | INTRAVENOUS | Status: DC | PRN

## 2015-10-10 MED ORDER — MAGNESIUM SULFATE 2 GM/50ML IV SOLN
2.0000 g | Freq: Once | INTRAVENOUS | Status: DC
  Filled 2015-10-10: qty 50

## 2015-10-10 NOTE — Progress Notes (Signed)
  Echocardiogram 2D Echocardiogram has been performed.  [name] 10/10/2015, 12:16 PM

## 2015-10-10 NOTE — Progress Notes (Signed)
Utilization review completed.  

## 2015-10-10 NOTE — Progress Notes (Signed)
Subjective:   No further CP. Breathing ok .    No intake or output data in the 24 hours ending 10/10/15 0807  Current meds: . amLODipine  10 mg Oral Daily  . aspirin EC  81 mg Oral Daily  . atorvastatin  80 mg Oral q1800  . ceFEPime (MAXIPIME) IV  2 g Intravenous Q24H  . cholecalciferol  2,000 Units Oral Daily  . clopidogrel  75 mg Oral Q breakfast  . ferrous sulfate  325 mg Oral QPM  . ferrous sulfate  650 mg Oral Q breakfast  . gabapentin  300 mg Oral TID  . heparin  5,000 Units Subcutaneous 3 times per day  . hydrochlorothiazide  25 mg Oral Daily  . insulin aspart  0-9 Units Subcutaneous 6 times per day  . lisinopril  40 mg Oral Daily  . metoprolol tartrate  25 mg Oral BID  . pantoprazole  40 mg Oral Daily  . pneumococcal 23 valent vaccine  0.5 mL Intramuscular Tomorrow-1000  . vancomycin  750 mg Intravenous Q12H   Infusions:     Objective:  Blood pressure 103/48, pulse 71, temperature 98.3 F (36.8 C), temperature source Oral, resp. rate 18, height 5\' 7"  (1.702 m), weight 70.1 kg (154 lb 8.7 oz), SpO2 96 %. Weight change: 0.246 kg (8.7 oz)  Physical Exam: General: Lying flat in bed. No respiratory difficulty HEENT: normal Neck: supple. no JVD. Carotids 2+ bilat; no bruits. No lymphadenopathy or thryomegaly appreciated. Cor: PMI nondisplaced. Regular rate & rhythm. No rubs, gallops or murmurs. Lungs: clear with decreased BS throughout Abdomen: soft, nontender, nondistended. No hepatosplenomegaly. No bruits or masses. Good bowel sounds. Extremities: no cyanosis, clubbing, rash, edema Neuro: alert & oriented x 3, cranial nerves grossly intact. moves all 4 extremities w/o difficulty. Affect pleasant.  Telemetry: SR 70s  Lab Results: Basic Metabolic Panel:  Recent Labs Lab 10/09/15 0340 10/09/15 0718  NA 134*  --   K 3.6  --   CL 102  --   CO2 20*  --   GLUCOSE 97  --   BUN 23*  --   CREATININE 1.47*  --   CALCIUM 8.6*  --   MG  --  1.4*   Liver  Function Tests:  Recent Labs Lab 10/09/15 0340  AST 16  ALT 11*  ALKPHOS 59  BILITOT 0.2*  PROT 7.2  ALBUMIN 3.7   No results for input(s): LIPASE, AMYLASE in the last 168 hours. No results for input(s): AMMONIA in the last 168 hours. CBC:  Recent Labs Lab 10/09/15 0340  WBC 15.1*  NEUTROABS 12.3*  HGB 10.7*  HCT 32.4*  MCV 84.8  PLT 435*   Cardiac Enzymes:  Recent Labs Lab 10/09/15 0340 10/09/15 0718 10/09/15 1021  TROPONINI <0.03 <0.03 <0.03   BNP: Invalid input(s): POCBNP CBG:  Recent Labs Lab 10/09/15 1300 10/09/15 2334 10/10/15 0413  GLUCAP 141* 179* 164*   Microbiology: Lab Results  Component Value Date   CULT PENDING 10/09/2015   CULT PENDING 10/09/2015   CULT  07/09/2015    7,000 COLONIES/mL INSIGNIFICANT GROWTH Performed at [facility] NO GROWTH 5 DAYS 07/09/2015   CULT NO GROWTH 5 DAYS 07/09/2015    Recent Labs Lab 10/09/15 0718 10/09/15 0722  CULT PENDING PENDING  SDES BLOOD RIGHT ARM BLOOD LEFT ARM    Imaging: Dg Chest Portable 1 View  10/09/2015  CLINICAL DATA:  Acute onset of midsternal chest pain, radiating to the left  arm. Initial encounter. EXAM: PORTABLE CHEST 1 VIEW COMPARISON:  Chest radiograph performed 07/09/2015 FINDINGS: The lungs are well-aerated. Mild right midlung zone airspace opacity could reflect mild pneumonia. There is no evidence of pleural effusion or pneumothorax. The cardiomediastinal silhouette is borderline normal in size. No acute osseous abnormalities are seen. IMPRESSION: Mild right midlung zone airspace opacity raises concern for mild pneumonia. If the patient has symptoms of pneumonia, followup PA and lateral chest X-ray is recommended in 3-4 weeks following trial of antibiotic therapy to ensure resolution. Otherwise, CT of the chest could be considered. Electronically Signed   By: Garald Balding M.D.   On: 10/09/2015 03:11     ASSESSMENT:  1. Chest pain concerning for [place] 2.  Pneumonia 3. CAD s/p stent 2003 4. PAD 5. DM2 6. COPD  7. AKI  8. Hypomagnesemia  PLAN/DISCUSSION:  CP very concerning for [place] in setting of current PNA. Fortunately cardiac markers and ECG remain normal. Continue ASA, b-blocker, and statin. If has recurrent Echo pending for today. Metformin onhold. He is already on Plavix for recent LE stent. If has surgical CAD will need to washout. Cath orders written for tomorrow. Will supp mag. Recheck BMET.  Continue treatment for PNA per primary team.    LOS: 1 day  Glori Bickers, MD 10/10/2015, 8:07 AM

## 2015-10-10 NOTE — Progress Notes (Signed)
PROGRESS NOTE  [name] [id number] DOB: 1945-08-28 DOA: 10/09/2015 PCP: No primary care provider on file.  HPI/Recap of past 24 hours:  Reported has been having diarrhea for the last two weeks, with stomach bloating over the phone to me this am, This pm when i saw him, he reports symptom seems has resolved later in the afternoon. Currently no ab pain, no n/v. No chest pain. On room air  Assessment/Plan: Principal Problem:   Chest pain Active Problems:   Sepsis ([place])   CAD (coronary artery disease)   Essential hypertension   HLD (hyperlipidemia)   Healthcare-associated pneumonia   Type 2 diabetes mellitus (HCC)   ASCVD (arteriosclerotic cardiovascular disease)   CKD (chronic kidney disease) stage 2, GFR 60-89 ml/min   Unstable angina (HCC)   Hypomagnesemia  1. Chest pain ? Unstable angina - Pt has multiple risk factors and known CAD; sxs concerning for unstable angina  - Initial EKG does not feature ischemic changesx2, troponin negative x3 - There is an apparent PNA on CXR, but reported CP symptoms are not pleuritic  -a1c/lipid panel/echo pending - on asa/plavix/statin/betablocker, Cardiology following ,input appreciated,   2. Sepsis secondary to HCAP - Meets sepsis criteria on arrival with tachycardia and leukocytosis; source is pulmonary, organism not yet known  - Empiric vancomycin and Zosyn administered in ED  - Sputum culture requested  -  lactate, procalcitonin unremarkable  - ua/ urine for strep pneumo/legionella antigens pending - Gentle IVF hydration   3. ASCVD  - Pt with known CAD, stent placed in 2003; known PAD with multiple angioplasty/stenting, fem-pop bypass  - Continue daily ASA 81, Plavix, statin, hold ACEi due to borderline bp - Concern for unstable angina as above  - Optimize risk factors   4. HTN  - At goal currently  - Has 1" nitro paste placed in ED  - Given Lopressor 12.5 mg tonight in setting of potential ACS  - bp  trending low, d/c norvasc/lisinipril, continue betablocker  5. Type II DM , noninsulin dependent - A1c was 9.0% in 2008 - Hold home glipizide and metformin while inpt  -  A1c pending , on ssi here  6. CKD stage II  - SCr 1.47 on admission, up from apparent baseline of ~1.2  - Small bump likely secondary to active infection and subsequent dehydration  - Anticipate improvement with gentle IVF hydration  - Repeat chem panel tomorrow  - Follow fluid status closely, intervene prn  - Avoid nephrotoxins where possible , ua pending  7. Diarrhea , reported last two weeks, will check c diff  8. Hypomagnesemia: replace mag, repeat in am   DVT ppx: SQ Heparin   Code Status: Full code Family Communication: Yes, patient's wife at bed side Disposition Plan: pending    Consultants:  cardiology  Procedures:  Cardiac cath 2/27  Antibiotics:  Vanc/cefepime from admission   Objective: BP 103/48 mmHg  Pulse 71  Temp(Src) 98.3 F (36.8 C) (Oral)  Resp 18  Ht 5\' 7"  (1.702 m)  Wt 70.1 kg (154 lb 8.7 oz)  BMI 24.20 kg/m2  SpO2 96% No intake or output data in the 24 hours ending 10/10/15 1358 Filed Weights   10/09/15 0217 10/10/15 0417  Weight: 69.854 kg (154 lb) 70.1 kg (154 lb 8.7 oz)    Exam:   General:  NAD  Cardiovascular: RRR  Respiratory: diminished, no wheezing, no rales, no rhonchi  Abdomen: Soft/ND/NT, positive BS  Musculoskeletal: No Edema  Neuro: aaox3  Data  Reviewed: Basic Metabolic Panel:  Recent Labs Lab 10/09/15 0340 10/09/15 0718 10/10/15 0827  NA 134*  --  137  K 3.6  --  4.6  CL 102  --  103  CO2 20*  --  22  GLUCOSE 97  --  162*  BUN 23*  --  12  CREATININE 1.47*  --  1.37*  CALCIUM 8.6*  --  9.3  MG  --  1.4*  --    Liver Function Tests:  Recent Labs Lab 10/09/15 0340 10/10/15 0827  AST 16 16  ALT 11* 11*  ALKPHOS 59 57  BILITOT 0.2* 0.3  PROT 7.2 6.5  ALBUMIN 3.7 3.2*   No results for input(s): LIPASE,  AMYLASE in the last 168 hours. No results for input(s): AMMONIA in the last 168 hours. CBC:  Recent Labs Lab 10/09/15 0340  WBC 15.1*  NEUTROABS 12.3*  HGB 10.7*  HCT 32.4*  MCV 84.8  PLT 435*   Cardiac Enzymes:    Recent Labs Lab 10/09/15 0340 10/09/15 0718 10/09/15 1021  TROPONINI <0.03 <0.03 <0.03   BNP (last 3 results)  Recent Labs  10/09/15 0718  BNP 15.0    ProBNP (last 3 results) No results for input(s): PROBNP in the last 8760 hours.  CBG:  Recent Labs Lab 10/09/15 1300 10/09/15 2334 10/10/15 0413 10/10/15 1129  GLUCAP 141* 179* 164* 172*    Recent Results (from the past 240 hour(s))  Culture, blood (routine x 2) Call MD if unable to obtain prior to antibiotics being given     Status: None (Preliminary result)   Collection Time: 10/09/15  7:18 AM  Result Value Ref Range Status   Specimen Description BLOOD RIGHT ARM  Final   Special Requests BOTTLES DRAWN AEROBIC AND ANAEROBIC 6CC  Final   Culture PENDING  Incomplete   Report Status PENDING  Incomplete  Culture, blood (routine x 2) Call MD if unable to obtain prior to antibiotics being given     Status: None (Preliminary result)   Collection Time: 10/09/15  7:22 AM  Result Value Ref Range Status   Specimen Description BLOOD LEFT ARM  Final   Special Requests BOTTLES DRAWN AEROBIC AND ANAEROBIC 6CC  Final   Culture PENDING  Incomplete   Report Status PENDING  Incomplete     Studies: No results found.  Scheduled Meds: . aspirin EC  81 mg Oral Daily  . atorvastatin  80 mg Oral q1800  . ceFEPime (MAXIPIME) IV  2 g Intravenous Q24H  . cholecalciferol  2,000 Units Oral Daily  . clopidogrel  75 mg Oral Q breakfast  . ferrous sulfate  325 mg Oral QPM  . ferrous sulfate  650 mg Oral Q breakfast  . gabapentin  300 mg Oral TID  . heparin  5,000 Units Subcutaneous 3 times per day  . insulin aspart  0-9 Units Subcutaneous 6 times per day  . magnesium sulfate 1 - 4 g bolus IVPB  2 g Intravenous  Once  . metoprolol tartrate  25 mg Oral BID  . pantoprazole  40 mg Oral Daily  . pneumococcal 23 valent vaccine  0.5 mL Intramuscular Tomorrow-1000  . vancomycin  750 mg Intravenous Q12H    Continuous Infusions:    Time spent: 17mins  Rakeem Colley MD, PhD  [facility] Pager [phone]. If 7PM-7AM, please contact night-coverage at [email or web address], password [facility] 10/10/2015, 1:58 PM  LOS: 1 day

## 2015-10-11 ENCOUNTER — Encounter (HOSPITAL_COMMUNITY): Payer: Self-pay | Admitting: Cardiology

## 2015-10-11 ENCOUNTER — Encounter (HOSPITAL_COMMUNITY)
Admission: EM | Disposition: A | Discharge status: Home / Self Care | Payer: Self-pay | Source: Home / Self Care | Attending: Internal Medicine

## 2015-10-11 DIAGNOSIS — E119 Type 2 diabetes mellitus without complications: Secondary | ICD-10-CM

## 2015-10-11 DIAGNOSIS — I25110 Atherosclerotic heart disease of native coronary artery with unstable angina pectoris: Principal | ICD-10-CM

## 2015-10-11 HISTORY — PX: CARDIAC CATHETERIZATION: SHX172

## 2015-10-11 LAB — COMPREHENSIVE METABOLIC PANEL
ALBUMIN: 3.2 g/dL — AB (ref 3.5–5.0)
ALT: 11 U/L — ABNORMAL LOW (ref 17–63)
ANION GAP: 9 (ref 5–15)
AST: 14 U/L — ABNORMAL LOW (ref 15–41)
Alkaline Phosphatase: 51 U/L (ref 38–126)
BUN: 12 mg/dL (ref 6–20)
CO2: 25 mmol/L (ref 22–32)
Calcium: 8.9 mg/dL (ref 8.9–10.3)
Chloride: 103 mmol/L (ref 101–111)
Creatinine, Ser: 1.25 mg/dL — ABNORMAL HIGH (ref 0.61–1.24)
GFR calc Af Amer: >60 mL/min (ref >60)
GFR calc non Af Amer: 57 mL/min — ABNORMAL LOW (ref >60)
GLUCOSE: 154 mg/dL — AB (ref 65–99)
POTASSIUM: 4.4 mmol/L (ref 3.5–5.1)
SODIUM: 137 mmol/L (ref 135–145)
Total Bilirubin: 0.3 mg/dL (ref 0.3–1.2)
Total Protein: 6.4 g/dL — ABNORMAL LOW (ref 6.5–8.1)

## 2015-10-11 LAB — CBC
HEMATOCRIT: 32 % — AB (ref 39.0–52.0)
HEMOGLOBIN: 10.4 g/dL — AB (ref 13.0–17.0)
MCH: 27.4 pg (ref 26.0–34.0)
MCHC: 32.5 g/dL (ref 30.0–36.0)
MCV: 84.2 fL (ref 78.0–100.0)
Platelets: 427 10*3/uL — ABNORMAL HIGH (ref 150–400)
RBC: 3.8 MIL/uL — AB (ref 4.22–5.81)
RDW: 15 % (ref 11.5–15.5)
WBC: 7.5 10*3/uL (ref 4.0–10.5)

## 2015-10-11 LAB — LEGIONELLA ANTIGEN, URINE

## 2015-10-11 LAB — GLUCOSE, CAPILLARY
GLUCOSE-CAPILLARY: 206 mg/dL — AB (ref 65–99)
GLUCOSE-CAPILLARY: 254 mg/dL — AB (ref 65–99)
Glucose-Capillary: 117 mg/dL — ABNORMAL HIGH (ref 65–99)
Glucose-Capillary: 88 mg/dL (ref 65–99)

## 2015-10-11 LAB — LIPID PANEL
CHOL/HDL RATIO: 4.1 ratio
CHOLESTEROL: 110 mg/dL (ref 0–200)
HDL: 27 mg/dL — AB (ref >40)
LDL Cholesterol: 56 mg/dL (ref 0–99)
Triglycerides: 135 mg/dL (ref <150)
VLDL: 27 mg/dL (ref 0–40)

## 2015-10-11 LAB — HEMOGLOBIN A1C
Hgb A1c MFr Bld: 7.9 % — ABNORMAL HIGH (ref 4.8–5.6)
MEAN PLASMA GLUCOSE: 180 mg/dL

## 2015-10-11 LAB — PROCALCITONIN

## 2015-10-11 LAB — MAGNESIUM: Magnesium: 2.1 mg/dL (ref 1.7–2.4)

## 2015-10-11 SURGERY — LEFT HEART CATH AND CORONARY ANGIOGRAPHY
Anesthesia: LOCAL

## 2015-10-11 MED ORDER — FENTANYL CITRATE (PF) 100 MCG/2ML IJ SOLN
INTRAMUSCULAR | Status: AC
  Filled 2015-10-11: qty 2

## 2015-10-11 MED ORDER — LIDOCAINE HCL (PF) 1 % IJ SOLN
INTRAMUSCULAR | Status: DC | PRN
  Administered 2015-10-11: 3 mL via SUBCUTANEOUS

## 2015-10-11 MED ORDER — LIDOCAINE HCL (PF) 1 % IJ SOLN
INTRAMUSCULAR | Status: AC
  Filled 2015-10-11: qty 30

## 2015-10-11 MED ORDER — HEPARIN (PORCINE) IN NACL 2-0.9 UNIT/ML-% IJ SOLN
INTRAMUSCULAR | Status: DC | PRN
  Administered 2015-10-11: 16:00:00 via INTRA_ARTERIAL

## 2015-10-11 MED ORDER — HEPARIN (PORCINE) IN NACL 2-0.9 UNIT/ML-% IJ SOLN
INTRAMUSCULAR | Status: DC | PRN
  Administered 2015-10-11: 16:00:00

## 2015-10-11 MED ORDER — SODIUM CHLORIDE 0.9 % WEIGHT BASED INFUSION
3.0000 mL/kg/h | INTRAVENOUS | Status: AC
  Administered 2015-10-11: 3 mL/kg/h via INTRAVENOUS

## 2015-10-11 MED ORDER — HEPARIN SODIUM (PORCINE) 5000 UNIT/ML IJ SOLN
5000.0000 [IU] | Freq: Three times a day (TID) | INTRAMUSCULAR | Status: DC
  Administered 2015-10-12 – 2015-10-18 (×16): 5000 [IU] via SUBCUTANEOUS
  Filled 2015-10-11 (×15): qty 1

## 2015-10-11 MED ORDER — HEPARIN SODIUM (PORCINE) 1000 UNIT/ML IJ SOLN
INTRAMUSCULAR | Status: AC
  Filled 2015-10-11: qty 1

## 2015-10-11 MED ORDER — FENTANYL CITRATE (PF) 100 MCG/2ML IJ SOLN
INTRAMUSCULAR | Status: DC | PRN
  Administered 2015-10-11: 25 ug via INTRAVENOUS

## 2015-10-11 MED ORDER — MIDAZOLAM HCL 2 MG/2ML IJ SOLN
INTRAMUSCULAR | Status: AC
  Filled 2015-10-11: qty 2

## 2015-10-11 MED ORDER — HEPARIN SODIUM (PORCINE) 1000 UNIT/ML IJ SOLN
INTRAMUSCULAR | Status: DC | PRN
  Administered 2015-10-11: 3500 [IU] via INTRAVENOUS

## 2015-10-11 MED ORDER — IOHEXOL 350 MG/ML SOLN
INTRAVENOUS | Status: DC | PRN
  Administered 2015-10-11: 55 mL via INTRACARDIAC

## 2015-10-11 MED ORDER — SODIUM CHLORIDE 0.9% FLUSH: 3.0000 mL | INTRAVENOUS | Status: DC | PRN

## 2015-10-11 MED ORDER — SODIUM CHLORIDE 0.9% FLUSH
3.0000 mL | Freq: Two times a day (BID) | INTRAVENOUS | Status: DC
  Administered 2015-10-11 – 2015-10-17 (×13): 3 mL via INTRAVENOUS

## 2015-10-11 MED ORDER — VERAPAMIL HCL 2.5 MG/ML IV SOLN
INTRAVENOUS | Status: AC
  Filled 2015-10-11: qty 2

## 2015-10-11 MED ORDER — SODIUM CHLORIDE 0.9 % IV SOLN: 250.0000 mL | INTRAVENOUS | Status: DC | PRN

## 2015-10-11 MED ORDER — HEPARIN (PORCINE) IN NACL 2-0.9 UNIT/ML-% IJ SOLN
INTRAMUSCULAR | Status: AC
  Filled 2015-10-11: qty 1000

## 2015-10-11 MED ORDER — MIDAZOLAM HCL 2 MG/2ML IJ SOLN
INTRAMUSCULAR | Status: DC | PRN
  Administered 2015-10-11: 1 mg via INTRAVENOUS

## 2015-10-11 SURGICAL SUPPLY — 11 items
CATH INFINITI 5 FR JL3.5 (CATHETERS) ×1 IMPLANT
CATH INFINITI 5FR ANG PIGTAIL (CATHETERS) ×1 IMPLANT
CATH INFINITI JR4 5F (CATHETERS) ×1 IMPLANT
DEVICE RAD COMP TR BAND LRG (VASCULAR PRODUCTS) ×2 IMPLANT
GLIDESHEATH SLEND SS 6F .021 (SHEATH) ×1 IMPLANT
KIT HEART LEFT (KITS) ×2 IMPLANT
PACK CARDIAC CATHETERIZATION (CUSTOM PROCEDURE TRAY) ×2 IMPLANT
SYR MEDRAD MARK V 150ML (SYRINGE) ×2 IMPLANT
TRANSDUCER W/STOPCOCK (MISCELLANEOUS) ×2 IMPLANT
TUBING CIL FLEX 10 FLL-RA (TUBING) ×2 IMPLANT
WIRE SAFE-T 1.5MM-J .035X260CM (WIRE) ×1 IMPLANT

## 2015-10-11 NOTE — H&P (View-Only) (Signed)
Subjective:   No further CP. Breathing ok .    No intake or output data in the 24 hours ending 10/10/15 0807  Current meds: . amLODipine  10 mg Oral Daily  . aspirin EC  81 mg Oral Daily  . atorvastatin  80 mg Oral q1800  . ceFEPime (MAXIPIME) IV  2 g Intravenous Q24H  . cholecalciferol  2,000 Units Oral Daily  . clopidogrel  75 mg Oral Q breakfast  . ferrous sulfate  325 mg Oral QPM  . ferrous sulfate  650 mg Oral Q breakfast  . gabapentin  300 mg Oral TID  . heparin  5,000 Units Subcutaneous 3 times per day  . hydrochlorothiazide  25 mg Oral Daily  . insulin aspart  0-9 Units Subcutaneous 6 times per day  . lisinopril  40 mg Oral Daily  . metoprolol tartrate  25 mg Oral BID  . pantoprazole  40 mg Oral Daily  . pneumococcal 23 valent vaccine  0.5 mL Intramuscular Tomorrow-1000  . vancomycin  750 mg Intravenous Q12H   Infusions:     Objective:  Blood pressure 103/48, pulse 71, temperature 98.3 F (36.8 C), temperature source Oral, resp. rate 18, height 5\' 7"  (1.702 m), weight 70.1 kg (154 lb 8.7 oz), SpO2 96 %. Weight change: 0.246 kg (8.7 oz)  Physical Exam: General: Lying flat in bed. No respiratory difficulty HEENT: normal Neck: supple. no JVD. Carotids 2+ bilat; no bruits. No lymphadenopathy or thryomegaly appreciated. Cor: PMI nondisplaced. Regular rate & rhythm. No rubs, gallops or murmurs. Lungs: clear with decreased BS throughout Abdomen: soft, nontender, nondistended. No hepatosplenomegaly. No bruits or masses. Good bowel sounds. Extremities: no cyanosis, clubbing, rash, edema Neuro: alert & oriented x 3, cranial nerves grossly intact. moves all 4 extremities w/o difficulty. Affect pleasant.  Telemetry: SR 70s  Lab Results: Basic Metabolic Panel:  Recent Labs Lab 10/09/15 0340 10/09/15 0718  NA 134*  --   K 3.6  --   CL 102  --   CO2 20*  --   GLUCOSE 97  --   BUN 23*  --   CREATININE 1.47*  --   CALCIUM 8.6*  --   MG  --  1.4*   Liver  Function Tests:  Recent Labs Lab 10/09/15 0340  AST 16  ALT 11*  ALKPHOS 59  BILITOT 0.2*  PROT 7.2  ALBUMIN 3.7   No results for input(s): LIPASE, AMYLASE in the last 168 hours. No results for input(s): AMMONIA in the last 168 hours. CBC:  Recent Labs Lab 10/09/15 0340  WBC 15.1*  NEUTROABS 12.3*  HGB 10.7*  HCT 32.4*  MCV 84.8  PLT 435*   Cardiac Enzymes:  Recent Labs Lab 10/09/15 0340 10/09/15 0718 10/09/15 1021  TROPONINI <0.03 <0.03 <0.03   BNP: Invalid input(s): POCBNP CBG:  Recent Labs Lab 10/09/15 1300 10/09/15 2334 10/10/15 0413  GLUCAP 141* 179* 164*   Microbiology: Lab Results  Component Value Date   CULT PENDING 10/09/2015   CULT PENDING 10/09/2015   CULT  07/09/2015    7,000 COLONIES/mL INSIGNIFICANT GROWTH Performed at [facility] NO GROWTH 5 DAYS 07/09/2015   CULT NO GROWTH 5 DAYS 07/09/2015    Recent Labs Lab 10/09/15 0718 10/09/15 0722  CULT PENDING PENDING  SDES BLOOD RIGHT ARM BLOOD LEFT ARM    Imaging: Dg Chest Portable 1 View  10/09/2015  CLINICAL DATA:  Acute onset of midsternal chest pain, radiating to the left  arm. Initial encounter. EXAM: PORTABLE CHEST 1 VIEW COMPARISON:  Chest radiograph performed 07/09/2015 FINDINGS: The lungs are well-aerated. Mild right midlung zone airspace opacity could reflect mild pneumonia. There is no evidence of pleural effusion or pneumothorax. The cardiomediastinal silhouette is borderline normal in size. No acute osseous abnormalities are seen. IMPRESSION: Mild right midlung zone airspace opacity raises concern for mild pneumonia. If the patient has symptoms of pneumonia, followup PA and lateral chest X-ray is recommended in 3-4 weeks following trial of antibiotic therapy to ensure resolution. Otherwise, CT of the chest could be considered. Electronically Signed   By: Garald Balding M.D.   On: 10/09/2015 03:11     ASSESSMENT:  1. Chest pain concerning for [place] 2.  Pneumonia 3. CAD s/p stent 2003 4. PAD 5. DM2 6. COPD  7. AKI  8. Hypomagnesemia  PLAN/DISCUSSION:  CP very concerning for [place] in setting of current PNA. Fortunately cardiac markers and ECG remain normal. Continue ASA, b-blocker, and statin. If has recurrent Echo pending for today. Metformin onhold. He is already on Plavix for recent LE stent. If has surgical CAD will need to washout. Cath orders written for tomorrow. Will supp mag. Recheck BMET.  Continue treatment for PNA per primary team.    LOS: 1 day  Glori Bickers, MD 10/10/2015, 8:07 AM

## 2015-10-11 NOTE — Progress Notes (Signed)
PROGRESS NOTE  [name] [id number] DOB: 1946/07/25 DOA: 10/09/2015 PCP: No primary care provider on file.  HPI/Recap of past 24 hours:  GI symptom has improved, c diff negative, awaiting for cardiac cath, multiple family members in room  Assessment/Plan: Principal Problem:   Chest pain Active Problems:   Sepsis ([place])   CAD (coronary artery disease)   Essential hypertension   HLD (hyperlipidemia)   Healthcare-associated pneumonia   Type 2 diabetes mellitus (HCC)   ASCVD (arteriosclerotic cardiovascular disease)   CKD (chronic kidney disease) stage 2, GFR 60-89 ml/min   Unstable angina (HCC)   Hypomagnesemia  1. Chest pain ? Unstable angina - Pt has multiple risk factors and known CAD; sxs concerning for unstable angina  - Initial EKG does not feature ischemic changesx2, troponin negative x3 - There is an apparent PNA on CXR, but reported CP symptoms are not pleuritic  -a1c/lipid panel/echo pending - on asa/plavix/statin/betablocker, Cardiology following ,input appreciated,  Cath on 2/27  2. Sepsis secondary to HCAP - Meets sepsis criteria on arrival with tachycardia and leukocytosis; source is pulmonary, organism not yet known  - Empiric vancomycin and Zosyn administered in ED  - Sputum culture requested , not collected -  lactate, procalcitonin unremarkable  - ua/ urine for strep pneumo/legionella antigens negative - Gentle IVF hydration  Patient was admitted to the hospital in 06/2015 for pna and sepsis, consider CT chest. Consider narrow abx soon.  3. ASCVD  - Pt with known CAD, stent placed in 2003; known PAD with multiple angioplasty/stenting, fem-pop bypass  - Continue daily ASA 81, Plavix, statin, hold ACEi due to borderline bp - Concern for unstable angina as above  - Optimize risk factors   4. HTN  -  Has 1" nitro paste placed in ED  - Given Lopressor 12.5 mg tonight in setting of potential ACS  - bp trending low, d/c  norvasc/lisinipril, continue betablocker, continue titrate meds  5. Type II DM , noninsulin dependent - A1c was 9.0% in 2008 - Hold home glipizide and metformin while inpt  -  A1c 7.9%, on ssi here  6. CKD stage II  - SCr 1.47 on admission, up from apparent baseline of ~1.2  - Small bump likely secondary to active infection and subsequent dehydration  - cr improving,  Avoid nephrotoxins where possible , ua unremakable  7. Diarrhea , reported last two weeks, c diff negative  8. Hypomagnesemia: replaced   DVT ppx: SQ Heparin   Code Status: Full code Family Communication: Yes, patient's wife at bed side Disposition Plan: pending    Consultants:  cardiology  Procedures:  Cardiac cath 2/27  Antibiotics:  Vanc/cefepime from admission   Objective: BP 157/80 mmHg  Pulse 72  Temp(Src) 98.2 F (36.8 C) (Oral)  Resp 16  Ht 5\' 7"  (1.702 m)  Wt 69.3 kg (152 lb 12.5 oz)  BMI 23.92 kg/m2  SpO2 98%  Intake/Output Summary (Last 24 hours) at 10/11/15 1900 Last data filed at 10/11/15 1200  Gross per 24 hour  Intake    363 ml  Output      0 ml  Net    363 ml   Filed Weights   10/09/15 0217 10/10/15 0417 10/11/15 0310  Weight: 69.854 kg (154 lb) 70.1 kg (154 lb 8.7 oz) 69.3 kg (152 lb 12.5 oz)    Exam:   General:  NAD  Cardiovascular: RRR  Respiratory: diminished, no wheezing, no rales, no rhonchi  Abdomen: Soft/ND/NT, positive BS  Musculoskeletal:  No Edema  Neuro: aaox3  Data Reviewed: Basic Metabolic Panel:  Recent Labs Lab 10/09/15 0340 10/09/15 0718 10/10/15 0827 10/11/15 0536  NA 134*  --  137 137  K 3.6  --  4.6 4.4  CL 102  --  103 103  CO2 20*  --  22 25  GLUCOSE 97  --  162* 154*  BUN 23*  --  12 12  CREATININE 1.47*  --  1.37* 1.25*  CALCIUM 8.6*  --  9.3 8.9  MG  --  1.4*  --  2.1   Liver Function Tests:  Recent Labs Lab 10/09/15 0340 10/10/15 0827 10/11/15 0536  AST 16 16 14*  ALT 11* 11* 11*  ALKPHOS 59 57 51   BILITOT 0.2* 0.3 0.3  PROT 7.2 6.5 6.4*  ALBUMIN 3.7 3.2* 3.2*   No results for input(s): LIPASE, AMYLASE in the last 168 hours. No results for input(s): AMMONIA in the last 168 hours. CBC:  Recent Labs Lab 10/09/15 0340 10/11/15 0536  WBC 15.1* 7.5  NEUTROABS 12.3*  --   HGB 10.7* 10.4*  HCT 32.4* 32.0*  MCV 84.8 84.2  PLT 435* 427*   Cardiac Enzymes:    Recent Labs Lab 10/09/15 0340 10/09/15 0718 10/09/15 1021  TROPONINI <0.03 <0.03 <0.03   BNP (last 3 results)  Recent Labs  10/09/15 0718  BNP 15.0    ProBNP (last 3 results) No results for input(s): PROBNP in the last 8760 hours.  CBG:  Recent Labs Lab 10/10/15 1617 10/10/15 2143 10/11/15 0826 10/11/15 1121 10/11/15 1649  GLUCAP 161* 190* 206* 117* 88    Recent Results (from the past 240 hour(s))  Culture, blood (routine x 2) Call MD if unable to obtain prior to antibiotics being given     Status: None (Preliminary result)   Collection Time: 10/09/15  7:18 AM  Result Value Ref Range Status   Specimen Description BLOOD RIGHT ARM  Final   Special Requests BOTTLES DRAWN AEROBIC AND ANAEROBIC 6CC  Final   Culture NO GROWTH 2 DAYS  Final   Report Status PENDING  Incomplete  Culture, blood (routine x 2) Call MD if unable to obtain prior to antibiotics being given     Status: None (Preliminary result)   Collection Time: 10/09/15  7:22 AM  Result Value Ref Range Status   Specimen Description BLOOD LEFT ARM  Final   Special Requests BOTTLES DRAWN AEROBIC AND ANAEROBIC 6CC  Final   Culture NO GROWTH 2 DAYS  Final   Report Status PENDING  Incomplete  C difficile quick scan w PCR reflex     Status: None   Collection Time: 10/10/15  4:38 PM  Result Value Ref Range Status   C Diff antigen NEGATIVE NEGATIVE Final   C Diff toxin NEGATIVE NEGATIVE Final   C Diff interpretation Negative for toxigenic C. difficile  Final  MRSA PCR Screening     Status: None   Collection Time: 10/10/15  9:16 PM  Result Value  Ref Range Status   MRSA by PCR NEGATIVE NEGATIVE Final    Comment:        The GeneXpert MRSA Assay (FDA approved for NASAL specimens only), is one component of a comprehensive MRSA colonization surveillance program. It is not intended to diagnose MRSA infection nor to guide or monitor treatment for MRSA infections.      Studies: No results found.  Scheduled Meds: . aspirin EC  81 mg Oral Daily  . atorvastatin  80 mg Oral q1800  . ceFEPime (MAXIPIME) IV  2 g Intravenous Q24H  . cholecalciferol  2,000 Units Oral Daily  . ferrous sulfate  325 mg Oral QPM  . ferrous sulfate  650 mg Oral Q breakfast  . gabapentin  300 mg Oral TID  . [START ON 10/12/2015] heparin  5,000 Units Subcutaneous 3 times per day  . insulin aspart  0-9 Units Subcutaneous 6 times per day  . metoprolol tartrate  25 mg Oral BID  . pantoprazole  40 mg Oral Daily  . pneumococcal 23 valent vaccine  0.5 mL Intramuscular Tomorrow-1000  . sodium chloride flush  3 mL Intravenous Q12H  . vancomycin  750 mg Intravenous Q12H    Continuous Infusions: . sodium chloride 3 mL/kg/hr (10/11/15 1816)     Time spent: 10mins  Myrtha Tonkovich MD, PhD  [facility] Pager [phone]. If 7PM-7AM, please contact night-coverage at [email or web address], password [facility] 10/11/2015, 7:00 PM  LOS: 2 days

## 2015-10-11 NOTE — Interval H&P Note (Signed)
History and Physical Interval Note:  10/11/2015 3:49 PM  [name]  has presented today for surgery, with the diagnosis of unstable angina  The various methods of treatment have been discussed with the patient and family. After consideration of risks, benefits and other options for treatment, the patient has consented to  Procedure(s): Left Heart Cath and Coronary Angiography (N/A) as a surgical intervention .  The patient's history has been reviewed, patient examined, no change in status, stable for surgery.  I have reviewed the patient's chart and labs.  Questions were answered to the patient's satisfaction.     Collier Salina [facility] 10/11/2015 3:49 PM Cath Lab Visit (complete for each Cath Lab visit)  Clinical Evaluation Leading to the Procedure:   ACS: Yes.    Non-ACS:    Anginal Classification: CCS III  Anti-ischemic medical therapy: Maximal Therapy (2 or more classes of medications)  Non-Invasive Test Results: No non-invasive testing performed  Prior CABG: No previous CABG

## 2015-10-12 ENCOUNTER — Other Ambulatory Visit: Payer: Self-pay | Admitting: *Deleted

## 2015-10-12 ENCOUNTER — Encounter (HOSPITAL_COMMUNITY): Payer: Self-pay | Admitting: Cardiology

## 2015-10-12 DIAGNOSIS — I2510 Atherosclerotic heart disease of native coronary artery without angina pectoris: Secondary | ICD-10-CM

## 2015-10-12 DIAGNOSIS — I10 Essential (primary) hypertension: Secondary | ICD-10-CM

## 2015-10-12 DIAGNOSIS — E785 Hyperlipidemia, unspecified: Secondary | ICD-10-CM

## 2015-10-12 LAB — BASIC METABOLIC PANEL
Anion gap: 11 (ref 5–15)
BUN: 12 mg/dL (ref 6–20)
CALCIUM: 8.8 mg/dL — AB (ref 8.9–10.3)
CHLORIDE: 104 mmol/L (ref 101–111)
CO2: 23 mmol/L (ref 22–32)
CREATININE: 1.32 mg/dL — AB (ref 0.61–1.24)
GFR calc non Af Amer: 54 mL/min — ABNORMAL LOW (ref >60)
GLUCOSE: 133 mg/dL — AB (ref 65–99)
Potassium: 4.3 mmol/L (ref 3.5–5.1)
Sodium: 138 mmol/L (ref 135–145)

## 2015-10-12 LAB — CBC
HEMATOCRIT: 33.7 % — AB (ref 39.0–52.0)
HEMOGLOBIN: 10.7 g/dL — AB (ref 13.0–17.0)
MCH: 26.9 pg (ref 26.0–34.0)
MCHC: 31.8 g/dL (ref 30.0–36.0)
MCV: 84.7 fL (ref 78.0–100.0)
Platelets: 476 10*3/uL — ABNORMAL HIGH (ref 150–400)
RBC: 3.98 MIL/uL — ABNORMAL LOW (ref 4.22–5.81)
RDW: 15 % (ref 11.5–15.5)
WBC: 8.3 10*3/uL (ref 4.0–10.5)

## 2015-10-12 LAB — GLUCOSE, CAPILLARY
GLUCOSE-CAPILLARY: 244 mg/dL — AB (ref 65–99)
Glucose-Capillary: 133 mg/dL — ABNORMAL HIGH (ref 65–99)
Glucose-Capillary: 207 mg/dL — ABNORMAL HIGH (ref 65–99)
Glucose-Capillary: 218 mg/dL — ABNORMAL HIGH (ref 65–99)
Glucose-Capillary: 254 mg/dL — ABNORMAL HIGH (ref 65–99)

## 2015-10-12 LAB — MAGNESIUM: MAGNESIUM: 1.9 mg/dL (ref 1.7–2.4)

## 2015-10-12 MED ORDER — INSULIN ASPART 100 UNIT/ML ~~LOC~~ SOLN
0.0000 [IU] | Freq: Three times a day (TID) | SUBCUTANEOUS | Status: DC
  Administered 2015-10-12 (×2): 3 [IU] via SUBCUTANEOUS
  Administered 2015-10-13 – 2015-10-14 (×3): 2 [IU] via SUBCUTANEOUS
  Administered 2015-10-14: 5 [IU] via SUBCUTANEOUS
  Administered 2015-10-14: 3 [IU] via SUBCUTANEOUS

## 2015-10-12 NOTE — Progress Notes (Signed)
Patient Name: [name] Date of Encounter: 10/12/2015  Primary Cardiologist: Dr. Gwenlyn Found   Principal Problem:   Unstable angina [facility]) Active Problems:   Sepsis ([place])   CAD (coronary artery disease)   Essential hypertension   HLD (hyperlipidemia)   Healthcare-associated pneumonia   Type 2 diabetes mellitus (HCC)   ASCVD (arteriosclerotic cardiovascular disease)   CKD (chronic kidney disease) stage 2, GFR 60-89 ml/min   Hypomagnesemia    SUBJECTIVE  Denies any CP or SOB. Former smoker, quit many years ago  Hilo . aspirin EC  81 mg Oral Daily  . atorvastatin  80 mg Oral q1800  . ceFEPime (MAXIPIME) IV  2 g Intravenous Q24H  . cholecalciferol  2,000 Units Oral Daily  . ferrous sulfate  325 mg Oral QPM  . ferrous sulfate  650 mg Oral Q breakfast  . gabapentin  300 mg Oral TID  . heparin  5,000 Units Subcutaneous 3 times per day  . insulin aspart  0-9 Units Subcutaneous TID WC  . metoprolol tartrate  25 mg Oral BID  . pantoprazole  40 mg Oral Daily  . pneumococcal 23 valent vaccine  0.5 mL Intramuscular Tomorrow-1000  . sodium chloride flush  3 mL Intravenous Q12H  . vancomycin  750 mg Intravenous Q12H    OBJECTIVE  Filed Vitals:   10/12/15 0356 10/12/15 0358 10/12/15 1012 10/12/15 1200  BP: 122/72  117/55 104/59  Pulse: 68  67 66  Temp: 97.8 F (36.6 C)   97.7 F (36.5 C)  TempSrc: Oral   Oral  Resp: 18   19  Height:      Weight:  152 lb 1.9 oz (69 kg)    SpO2: 97%   97%    Intake/Output Summary (Last 24 hours) at 10/12/15 1318 Last data filed at 10/12/15 1015  Gross per 24 hour  Intake      3 ml  Output      0 ml  Net      3 ml   Filed Weights   10/10/15 0417 10/11/15 0310 10/12/15 0358  Weight: 154 lb 8.7 oz (70.1 kg) 152 lb 12.5 oz (69.3 kg) 152 lb 1.9 oz (69 kg)    PHYSICAL EXAM  General: Pleasant, NAD. Neuro: Alert and oriented X 3. Moves all extremities spontaneously. Psych: Normal affect. HEENT:  Normal  Neck: Supple without  bruits or JVD. Lungs:  Resp regular and unlabored, CTA. Heart: RRR no s3, s4, or murmurs. Abdomen: Soft, non-tender, non-distended, BS + x 4.  Extremities: No clubbing, cyanosis or edema. DP/PT/Radials 2+ and equal bilaterally.  Accessory Clinical Findings  CBC  Recent Labs  10/11/15 0536 10/12/15 0415  WBC 7.5 8.3  HGB 10.4* 10.7*  HCT 32.0* 33.7*  MCV 84.2 84.7  PLT 427* 0000000*   Basic Metabolic Panel  Recent Labs  10/11/15 0536 10/12/15 0415  NA 137 138  K 4.4 4.3  CL 103 104  CO2 25 23  GLUCOSE 154* 133*  BUN 12 12  CREATININE 1.25* 1.32*  CALCIUM 8.9 8.8*  MG 2.1 1.9   Liver Function Tests  Recent Labs  10/10/15 0827 10/11/15 0536  AST 16 14*  ALT 11* 11*  ALKPHOS 57 51  BILITOT 0.3 0.3  PROT 6.5 6.4*  ALBUMIN 3.2* 3.2*   Fasting Lipid Panel  Recent Labs  10/11/15 0536  CHOL 110  HDL 27*  LDLCALC 56  TRIG 135  CHOLHDL 4.1    TELE NSR with HR 60-70s  ECG  No new EKG  Echocardiogram 10/10/2015  LV EF: 50%  ------------------------------------------------------------------- Indications:   Chest pain 786.51.  ------------------------------------------------------------------- History:  PMH:  Coronary artery disease. Risk factors: Chronic kidney disease. Sepsis. Hypertension. Diabetes mellitus. Dyslipidemia.  ------------------------------------------------------------------- Study Conclusions  - Left ventricle: The cavity size was normal. Systolic function was very mildly reduced. The estimated ejection fraction was 50%. Mild hypokinesis of the anterior and anterolateral myocardium. Left ventricular diastolic function parameters were normal. - Aortic valve: Transvalvular velocity was within the normal range. There was no stenosis. There was no regurgitation. - Mitral valve: There was trivial regurgitation. - Right ventricle: The cavity size was normal. Wall thickness was normal. Systolic function was  normal. - Tricuspid valve: There was mild regurgitation. - Pulmonary arteries: Systolic pressure was mildly increased. PA peak pressure: 38 mm Hg (S). - Inferior vena cava: The vessel was dilated. The respirophasic diameter changes were blunted (< 50%), consistent with elevated central venous pressure.    Radiology/Studies  Dg Chest Portable 1 View  10/09/2015  CLINICAL DATA:  Acute onset of midsternal chest pain, radiating to the left arm. Initial encounter. EXAM: PORTABLE CHEST 1 VIEW COMPARISON:  Chest radiograph performed 07/09/2015 FINDINGS: The lungs are well-aerated. Mild right midlung zone airspace opacity could reflect mild pneumonia. There is no evidence of pleural effusion or pneumothorax. The cardiomediastinal silhouette is borderline normal in size. No acute osseous abnormalities are seen. IMPRESSION: Mild right midlung zone airspace opacity raises concern for mild pneumonia. If the patient has symptoms of pneumonia, followup PA and lateral chest X-ray is recommended in 3-4 weeks following trial of antibiotic therapy to ensure resolution. Otherwise, CT of the chest could be considered. Electronically Signed   By: Garald Balding M.D.   On: 10/09/2015 03:11    ASSESSMENT AND PLAN  70 yo male with PMH of CAPD, HTN, HLD, DM II, CAD with stent 2003, and PAD with stenting and fem-pop bypass presented with chest pain and fever   1. Unstable angina  - cath 10/11/2015 90% prox RCA, 95% prox to mid RCA, 80% prox to mid LCx, 85% ost LAD, 85% mid LAD, 70% D1.   - CT surgery consulted, plavix stopped, last dose AM of 2/27. Continue ASA, statin, metoprolol.   2. Sepsis 2/2 HCAP  3. CAD s/p stent in 2003: see recent cath report 2/27 above  4. PAD: 3 weeks ago underwent RLE PCI for claudication. Unable to get femoral access so went through his left arm.   5. COPD 6. HTN 7. HLD 8. DM II  Signed, Almyra Deforest PA-C Pager: [phone]  Patient seen and examined. Agree with assessment and  plan.  No recurrent chest pain.  Coronary angiography reviewed.  Severe multivessel CAD.  Patient's last dose of Plavix was yesterday and will need to be held for 5 days prior to anticipated CABG surgery un  less, P2Y12 testing indicates that the patient is a poor responder and can be done sooner   Troy Sine, MD, [facility] 10/12/2015 3:59 PM

## 2015-10-12 NOTE — Progress Notes (Signed)
10/12/2015 1645 Received pt back from the cath lab.  Tele monitor applied and CCMD notified.  TR band WNL, has 11cc's in band, no bleeding noted.  Instructed pt to not move r arm.  Call bell in reach and family at bedside. [place]

## 2015-10-12 NOTE — Progress Notes (Signed)
PROGRESS NOTE  [name] [id number] DOB: [birth year] DOA: 10/09/2015 PCP: No primary care provider on file.  HPI/Recap of past 24 hours:  Denies cheat pain, GI symptom has improved, cough has resolved, multiple family members in room  Assessment/Plan: Principal Problem:   Unstable angina ([place]) Active Problems:   Sepsis ([place])   CAD (coronary artery disease)   Essential hypertension   HLD (hyperlipidemia)   Healthcare-associated pneumonia   Type 2 diabetes mellitus (HCC)   ASCVD (arteriosclerotic cardiovascular disease)   CKD (chronic kidney disease) stage 2, GFR 60-89 ml/min   Hypomagnesemia  1. Chest pain /Unstable angina - Pt has multiple risk factors and known CAD; sxs concerning for unstable angina  - Initial EKG does not feature ischemic changesx2, troponin negative x3 - There is an apparent PNA on CXR, but reported CP symptoms are not pleuritic  -a1c7.9/lipid panel ldl 56, hdl 27/echo result noted - has been on asa/plavix/statin/betablocker, Cath on 2/27 with three vessel disease, need CABG, plavix d/ced, f/u on cards/CTsurgery rec's.  2. Sepsis secondary to HCAP - Meets sepsis criteria on arrival with tachycardia and leukocytosis; source is pulmonary, organism not yet known  - Empiric vancomycin and Zosyn administered in ED and hydration - Sputum culture requested , not collected, no cough has resolved. -  lactate, procalcitonin unremarkable  - ua/ urine for strep pneumo/legionella antigensnegative, mrsa screen negative. - d/c vanc on 2/28, continue cefepime, Patient was admitted to the hospital in 06/2015 for pna and sepsis, consider CT chest. Taper abx soon if CT chest unremarkable.   3. ASCVD  - Pt with known CAD, stent placed in 2003; known PAD with multiple angioplasty/stenting, fem-pop bypass  - Continue daily ASA 81, statin, Plavix was d/ced on 2/27 to anticipate CABG,  hold ACEi due to borderline bp - Concern for unstable angina as above   - Optimize risk factors   4. HTN  -  Has 1" nitro paste placed in ED  - Given Lopressor 12.5 mg tonight in setting of potential ACS  - bp trending low, d/c norvasc/lisinipril, continue betablocker, continue titrate meds  5. Type II DM , noninsulin dependent - A1c was 9.0% in 2008 - Hold home glipizide and metformin while inpt  -  A1c 7.9%, on ssi here  6. CKD stage II  - SCr 1.47 on admission, up from apparent baseline of ~1.2  - Small bump likely secondary to active infection and subsequent dehydration  - cr fluctuating,  Avoid nephrotoxins where possible , ua unremakable  7. Diarrhea , reported last two weeks, c diff negative  8. Hypomagnesemia: replaced   DVT ppx: SQ Heparin   Code Status: Full code Family Communication: Yes, patient's wife at bed side Disposition Plan: pending    Consultants:  Cardiology  CT surgery  Procedures:  Cardiac cath 2/27  CABG pending  Antibiotics:  Vanc from admission to 2/28 cefepime from admission  Objective: BP 104/59 mmHg  Pulse 66  Temp(Src) 97.7 F (36.5 C) (Oral)  Resp 19  Ht 5\' 7"  (1.702 m)  Wt 69 kg (152 lb 1.9 oz)  BMI 23.82 kg/m2  SpO2 97%  Intake/Output Summary (Last 24 hours) at 10/12/15 1712 Last data filed at 10/12/15 1300  Gross per 24 hour  Intake    243 ml  Output      0 ml  Net    243 ml   Filed Weights   10/10/15 0417 10/11/15 0310 10/12/15 0358  Weight: 70.1 kg (154 lb 8.7  oz) 69.3 kg (152 lb 12.5 oz) 69 kg (152 lb 1.9 oz)    Exam:   General:  NAD  Cardiovascular: RRR  Respiratory: diminished, no wheezing, no rales, no rhonchi  Abdomen: Soft/ND/NT, positive BS  Musculoskeletal: No Edema  Neuro: aaox3  Data Reviewed: Basic Metabolic Panel:  Recent Labs Lab 10/09/15 0340 10/09/15 0718 10/10/15 0827 10/11/15 0536 10/12/15 0415  NA 134*  --  137 137 138  K 3.6  --  4.6 4.4 4.3  CL 102  --  103 103 104  CO2 20*  --  22 25 23   GLUCOSE 97  --  162* 154*  133*  BUN 23*  --  12 12 12   CREATININE 1.47*  --  1.37* 1.25* 1.32*  CALCIUM 8.6*  --  9.3 8.9 8.8*  MG  --  1.4*  --  2.1 1.9   Liver Function Tests:  Recent Labs Lab 10/09/15 0340 10/10/15 0827 10/11/15 0536  AST 16 16 14*  ALT 11* 11* 11*  ALKPHOS 59 57 51  BILITOT 0.2* 0.3 0.3  PROT 7.2 6.5 6.4*  ALBUMIN 3.7 3.2* 3.2*   No results for input(s): LIPASE, AMYLASE in the last 168 hours. No results for input(s): AMMONIA in the last 168 hours. CBC:  Recent Labs Lab 10/09/15 0340 10/11/15 0536 10/12/15 0415  WBC 15.1* 7.5 8.3  NEUTROABS 12.3*  --   --   HGB 10.7* 10.4* 10.7*  HCT 32.4* 32.0* 33.7*  MCV 84.8 84.2 84.7  PLT 435* 427* 476*   Cardiac Enzymes:    Recent Labs Lab 10/09/15 0340 10/09/15 0718 10/09/15 1021  TROPONINI <0.03 <0.03 <0.03   BNP (last 3 results)  Recent Labs  10/09/15 0718  BNP 15.0    ProBNP (last 3 results) No results for input(s): PROBNP in the last 8760 hours.  CBG:  Recent Labs Lab 10/11/15 2015 10/12/15 0018 10/12/15 0355 10/12/15 1119 10/12/15 1638  GLUCAP 254* 254* 133* 207* 218*    Recent Results (from the past 240 hour(s))  Culture, blood (routine x 2) Call MD if unable to obtain prior to antibiotics being given     Status: None (Preliminary result)   Collection Time: 10/09/15  7:18 AM  Result Value Ref Range Status   Specimen Description BLOOD RIGHT ARM  Final   Special Requests BOTTLES DRAWN AEROBIC AND ANAEROBIC 6CC  Final   Culture NO GROWTH 3 DAYS  Final   Report Status PENDING  Incomplete  Culture, blood (routine x 2) Call MD if unable to obtain prior to antibiotics being given     Status: None (Preliminary result)   Collection Time: 10/09/15  7:22 AM  Result Value Ref Range Status   Specimen Description BLOOD LEFT ARM  Final   Special Requests BOTTLES DRAWN AEROBIC AND ANAEROBIC 6CC  Final   Culture NO GROWTH 3 DAYS  Final   Report Status PENDING  Incomplete  C difficile quick scan w PCR reflex      Status: None   Collection Time: 10/10/15  4:38 PM  Result Value Ref Range Status   C Diff antigen NEGATIVE NEGATIVE Final   C Diff toxin NEGATIVE NEGATIVE Final   C Diff interpretation Negative for toxigenic C. difficile  Final  MRSA PCR Screening     Status: None   Collection Time: 10/10/15  9:16 PM  Result Value Ref Range Status   MRSA by PCR NEGATIVE NEGATIVE Final    Comment:  The GeneXpert MRSA Assay (FDA approved for NASAL specimens only), is one component of a comprehensive MRSA colonization surveillance program. It is not intended to diagnose MRSA infection nor to guide or monitor treatment for MRSA infections.      Studies: No results found.  Scheduled Meds: . aspirin EC  81 mg Oral Daily  . atorvastatin  80 mg Oral q1800  . ceFEPime (MAXIPIME) IV  2 g Intravenous Q24H  . cholecalciferol  2,000 Units Oral Daily  . ferrous sulfate  325 mg Oral QPM  . ferrous sulfate  650 mg Oral Q breakfast  . gabapentin  300 mg Oral TID  . heparin  5,000 Units Subcutaneous 3 times per day  . insulin aspart  0-9 Units Subcutaneous TID WC  . metoprolol tartrate  25 mg Oral BID  . pantoprazole  40 mg Oral Daily  . pneumococcal 23 valent vaccine  0.5 mL Intramuscular Tomorrow-1000  . sodium chloride flush  3 mL Intravenous Q12H    Continuous Infusions:     Time spent: 39mins  Jacki Couse MD, PhD  [facility] Pager [phone]. If 7PM-7AM, please contact night-coverage at [email or web address], password [facility] 10/12/2015, 5:12 PM  LOS: 3 days

## 2015-10-13 ENCOUNTER — Inpatient Hospital Stay (HOSPITAL_COMMUNITY): Payer: Non-veteran care

## 2015-10-13 DIAGNOSIS — Z951 Presence of aortocoronary bypass graft: Secondary | ICD-10-CM

## 2015-10-13 DIAGNOSIS — R9389 Abnormal findings on diagnostic imaging of other specified body structures: Secondary | ICD-10-CM | POA: Diagnosis present

## 2015-10-13 DIAGNOSIS — I2510 Atherosclerotic heart disease of native coronary artery without angina pectoris: Secondary | ICD-10-CM

## 2015-10-13 DIAGNOSIS — E119 Type 2 diabetes mellitus without complications: Secondary | ICD-10-CM | POA: Insufficient documentation

## 2015-10-13 DIAGNOSIS — I25110 Atherosclerotic heart disease of native coronary artery with unstable angina pectoris: Secondary | ICD-10-CM

## 2015-10-13 DIAGNOSIS — R938 Abnormal findings on diagnostic imaging of other specified body structures: Secondary | ICD-10-CM

## 2015-10-13 HISTORY — DX: Presence of aortocoronary bypass graft: Z95.1

## 2015-10-13 LAB — CBC
HCT: 34.1 % — ABNORMAL LOW (ref 39.0–52.0)
Hemoglobin: 10.7 g/dL — ABNORMAL LOW (ref 13.0–17.0)
MCH: 26.8 pg (ref 26.0–34.0)
MCHC: 31.4 g/dL (ref 30.0–36.0)
MCV: 85.5 fL (ref 78.0–100.0)
PLATELETS: 455 10*3/uL — AB (ref 150–400)
RBC: 3.99 MIL/uL — ABNORMAL LOW (ref 4.22–5.81)
RDW: 15 % (ref 11.5–15.5)
WBC: 8.7 10*3/uL (ref 4.0–10.5)

## 2015-10-13 LAB — BASIC METABOLIC PANEL
Anion gap: 8 (ref 5–15)
BUN: 15 mg/dL (ref 6–20)
CALCIUM: 9.2 mg/dL (ref 8.9–10.3)
CO2: 26 mmol/L (ref 22–32)
CREATININE: 1.53 mg/dL — AB (ref 0.61–1.24)
Chloride: 103 mmol/L (ref 101–111)
GFR, EST AFRICAN AMERICAN: 52 mL/min — AB (ref >60)
GFR, EST NON AFRICAN AMERICAN: 45 mL/min — AB (ref >60)
Glucose, Bld: 194 mg/dL — ABNORMAL HIGH (ref 65–99)
Potassium: 5.2 mmol/L — ABNORMAL HIGH (ref 3.5–5.1)
SODIUM: 137 mmol/L (ref 135–145)

## 2015-10-13 LAB — GLUCOSE, CAPILLARY
GLUCOSE-CAPILLARY: 188 mg/dL — AB (ref 65–99)
GLUCOSE-CAPILLARY: 233 mg/dL — AB (ref 65–99)
Glucose-Capillary: 165 mg/dL — ABNORMAL HIGH (ref 65–99)
Glucose-Capillary: 215 mg/dL — ABNORMAL HIGH (ref 65–99)

## 2015-10-13 LAB — PROCALCITONIN

## 2015-10-13 MED ORDER — INSULIN DETEMIR 100 UNIT/ML ~~LOC~~ SOLN
10.0000 [IU] | Freq: Every day | SUBCUTANEOUS | Status: DC
  Administered 2015-10-13: 10 [IU] via SUBCUTANEOUS
  Filled 2015-10-13 (×2): qty 0.1

## 2015-10-13 NOTE — Progress Notes (Signed)
2 Days Post-Op Procedure(s) (LRB): Left Heart Cath and Coronary Angiography (N/A) Subjective: No chest pain  Objective: Vital signs in last 24 hours: Temp:  [98 F (36.7 C)-98.7 F (37.1 C)] 98.7 F (37.1 C) (03/01 1500) Pulse Rate:  [59-72] 59 (03/01 1500) Cardiac Rhythm:  [-] Normal sinus rhythm (03/01 0700) Resp:  [16-19] 18 (03/01 1500) BP: (115-125)/(53-57) 125/53 mmHg (03/01 1500) SpO2:  [97 %-100 %] 100 % (03/01 1500) Weight:  [152 lb 14.4 oz (69.355 kg)] 152 lb 14.4 oz (69.355 kg) (03/01 0550)  Hemodynamic parameters for last 24 hours:    Intake/Output from previous day: 02/28 0701 - 03/01 0700 In: 483 [P.O.:480; I.V.:3] Out: -  Intake/Output this shift: Total I/O In: 240 [P.O.:240] Out: -   General appearance: alert, cooperative and no distress Neurologic: intact Heart: regular rate and rhythm Lungs: clear to auscultation bilaterally  Lab Results:  Recent Labs  10/12/15 0415 10/13/15 0500  WBC 8.3 8.7  HGB 10.7* 10.7*  HCT 33.7* 34.1*  PLT 476* 455*   BMET:  Recent Labs  10/12/15 0415 10/13/15 0500  NA 138 137  K 4.3 5.2*  CL 104 103  CO2 23 26  GLUCOSE 133* 194*  BUN 12 15  CREATININE 1.32* 1.53*  CALCIUM 8.8* 9.2    PT/INR: No results for input(s): LABPROT, INR in the last 72 hours. ABG    Component Value Date/Time   PHART 7.427 07/09/2015 0609   HCO3 18.9* 07/09/2015 0609   TCO2 11.7 07/09/2015 0609   O2SAT 94.9 07/09/2015 0609   CBG (last 3)   Recent Labs  10/12/15 1954 10/13/15 0556 10/13/15 1139  GLUCAP 244* 188* 233*    Assessment/Plan: S/P Procedure(s) (LRB): Left Heart Cath and Coronary Angiography (N/A) -  Reviewed vascular studies.   -1-39% bilateral ICA stenosis -Palmar arch doppler obliterates with radial compression- radial artery not an option -Vein mapping indicates there is likely sufficient vein in right leg to graft all the targets, if not, he may need PCI to RCA as a separate procedure.  I personally  reviewed his CXR and CT- he has a spiculated lesion in the RUL, 2.2 cm. 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  -Will need a PET/CT- can be done as an outpatient at a later date.  - He needs full PFTs with and without bronchodilators and with DLCO- I have ordered those.  Plan for CABG on Monday  We will NOT attempt lung surgery at same time as his CABG  We need to see if we can get his CBG control improved while he is in the hospital   LOS: 4 days    [name] 10/13/2015

## 2015-10-13 NOTE — Consult Note (Signed)
Reason for Consult:3 vessel CAD Referring Physician: Dr. Martinique  [name] is an 70 y.o. male.  HPI: 70 yo man admitted with unstable chest pain.  [name] is a 69 yo man with multiple CRF including type II diabetes, hypertension, hyperlipidemia. He has known CAD with a stent placed in the circumflex many years ago. He also has PAD with previous left fem pop with Goretex and and a right to left fem fem bypass. He began having CP about a week prior to admission. Initially this was exertional and relieved with rest. The night before admission he had a severe episode at rest with radiation to the left arm. He felt feverish and short of breath. He ruled out for MI. Yesterday he had cardiac catheterization which revealed severe 3 vessel CAD.  Was treated for presumed pneumonia with vanco and zosyn. Lactic acid and pro-calcitonin were WNL.  Past Medical History  Diagnosis Date  . Hypertension   . Hypercholesteremia   . GERD (gastroesophageal reflux disease)   . Diabetes mellitus without complication ([place])   . Neuropathy ([place])   . Peripheral vascular disease ([place])     s/p R-L FEM-FEM BYPASS  . CAD S/P percutaneous coronary angioplasty 1998    PCI TO CX    Past Surgical History  Procedure Laterality Date  . Cardiac catheterization  2003    with stent  . Femoral-popliteal bypass graft    . Colonoscopy N/A 09/22/2013    Procedure: COLONOSCOPY;  Surgeon: Anthony Dolin, MD;  Location: [facility];  Service: Endoscopy;  Laterality: N/A;  9:30 AM  . Cardiac catheterization N/A 10/11/2015    Procedure: Left Heart Cath and Coronary Angiography;  Surgeon: Anthony M Martinique, MD;  Location: [facility];  Service: Cardiovascular;  Laterality: N/A;    Family History  Problem Relation Age of Onset  . Colon cancer Neg Hx     Social History:  reports that he has quit smoking. His smoking use included Cigarettes. He has a 60 pack-year smoking history. He does not have any smokeless tobacco  history on file. He reports that he does not drink alcohol or use illicit drugs.  Allergies: No Known Allergies  Medications:  Prior to Admission:  Prescriptions prior to admission  Medication Sig Dispense Refill Last Dose  . amLODipine (NORVASC) 10 MG tablet Take 10 mg by mouth daily.   10/08/2015 at Unknown time  . aspirin 81 MG tablet Take 81 mg by mouth daily.   10/08/2015 at Unknown time  . Cholecalciferol (VITAMIN D) 2000 UNITS tablet Take 2,000 Units by mouth daily.   10/08/2015 at Unknown time  . clopidogrel (PLAVIX) 75 MG tablet Take 75 mg by mouth daily with breakfast.   10/08/2015 at 0900  . ferrous sulfate 325 (65 FE) MG tablet Take 325-650 mg by mouth 2 (two) times daily with a meal. 63m in the morning and 3274min the evening   10/08/2015 at Unknown time  . gabapentin (NEURONTIN) 300 MG capsule Take 300 mg by mouth 3 (three) times daily.    10/08/2015 at Unknown time  . glipiZIDE (GLUCOTROL) 5 MG tablet Take 10 mg by mouth 2 (two) times daily before a meal.    10/08/2015 at Unknown time  . hydrochlorothiazide (HYDRODIURIL) 25 MG tablet Take 25 mg by mouth daily.   10/08/2015 at Unknown time  . lisinopril (PRINIVIL,ZESTRIL) 40 MG tablet Take 40 mg by mouth daily.   10/08/2015 at Unknown time  . metFORMIN (GLUCOPHAGE)  1000 MG tablet Take 1,000 mg by mouth 2 (two) times daily with a meal.   10/08/2015 at Unknown time  . pantoprazole (PROTONIX) 40 MG tablet Take 40 mg by mouth daily.   10/08/2015 at Unknown time  . simvastatin (ZOCOR) 40 MG tablet Take 20 mg by mouth at bedtime.    10/08/2015 at Unknown time  . levofloxacin (LEVAQUIN) 500 MG tablet Take 1 tablet (500 mg total) by mouth daily. (Patient not taking: Reported on 10/09/2015) 4 tablet 0 Not Taking at Unknown time    Results for orders placed or performed during the hospital encounter of 10/09/15 (from the past 48 hour(s))  Glucose, capillary     Status: Abnormal   Collection Time: 10/11/15  8:26 AM  Result Value Ref Range    Glucose-Capillary 206 (H) 65 - 99 mg/dL   Comment 1 Notify RN    Comment 2 Document in Chart   Glucose, capillary     Status: Abnormal   Collection Time: 10/11/15 11:21 AM  Result Value Ref Range   Glucose-Capillary 117 (H) 65 - 99 mg/dL   Comment 1 Notify RN    Comment 2 Document in Chart   Glucose, capillary     Status: None   Collection Time: 10/11/15  4:49 PM  Result Value Ref Range   Glucose-Capillary 88 65 - 99 mg/dL   Comment 1 Notify RN    Comment 2 Document in Chart   Glucose, capillary     Status: Abnormal   Collection Time: 10/11/15  8:15 PM  Result Value Ref Range   Glucose-Capillary 254 (H) 65 - 99 mg/dL  Glucose, capillary     Status: Abnormal   Collection Time: 10/12/15 12:18 AM  Result Value Ref Range   Glucose-Capillary 254 (H) 65 - 99 mg/dL  Glucose, capillary     Status: Abnormal   Collection Time: 10/12/15  3:55 AM  Result Value Ref Range   Glucose-Capillary 133 (H) 65 - 99 mg/dL  CBC     Status: Abnormal   Collection Time: 10/12/15  4:15 AM  Result Value Ref Range   WBC 8.3 4.0 - 10.5 K/uL   RBC 3.98 (L) 4.22 - 5.81 MIL/uL   Hemoglobin 10.7 (L) 13.0 - 17.0 g/dL   HCT 33.7 (L) 39.0 - 52.0 %   MCV 84.7 78.0 - 100.0 fL   MCH 26.9 26.0 - 34.0 pg   MCHC 31.8 30.0 - 36.0 g/dL   RDW 15.0 11.5 - 15.5 %   Platelets 476 (H) 150 - 400 K/uL  Basic metabolic panel     Status: Abnormal   Collection Time: 10/12/15  4:15 AM  Result Value Ref Range   Sodium 138 135 - 145 mmol/L   Potassium 4.3 3.5 - 5.1 mmol/L   Chloride 104 101 - 111 mmol/L   CO2 23 22 - 32 mmol/L   Glucose, Bld 133 (H) 65 - 99 mg/dL   BUN 12 6 - 20 mg/dL   Creatinine, Ser 1.32 (H) 0.61 - 1.24 mg/dL   Calcium 8.8 (L) 8.9 - 10.3 mg/dL   GFR calc non Af Amer 54 (L) >60 mL/min   GFR calc Af Amer >60 >60 mL/min    Comment: (NOTE) The eGFR has been calculated using the CKD EPI equation. This calculation has not been validated in all clinical situations. eGFR's persistently <60 mL/min signify  possible Chronic Kidney Disease.    Anion gap 11 5 - 15  Magnesium     Status:  None   Collection Time: 10/12/15  4:15 AM  Result Value Ref Range   Magnesium 1.9 1.7 - 2.4 mg/dL  Glucose, capillary     Status: Abnormal   Collection Time: 10/12/15 11:19 AM  Result Value Ref Range   Glucose-Capillary 207 (H) 65 - 99 mg/dL   Comment 1 Notify RN    Comment 2 Document in Chart   Glucose, capillary     Status: Abnormal   Collection Time: 10/12/15  4:38 PM  Result Value Ref Range   Glucose-Capillary 218 (H) 65 - 99 mg/dL   Comment 1 Notify RN    Comment 2 Document in Chart   Glucose, capillary     Status: Abnormal   Collection Time: 10/12/15  7:54 PM  Result Value Ref Range   Glucose-Capillary 244 (H) 65 - 99 mg/dL  CBC     Status: Abnormal   Collection Time: 10/13/15  5:00 AM  Result Value Ref Range   WBC 8.7 4.0 - 10.5 K/uL   RBC 3.99 (L) 4.22 - 5.81 MIL/uL   Hemoglobin 10.7 (L) 13.0 - 17.0 g/dL   HCT 34.1 (L) 39.0 - 52.0 %   MCV 85.5 78.0 - 100.0 fL   MCH 26.8 26.0 - 34.0 pg   MCHC 31.4 30.0 - 36.0 g/dL   RDW 15.0 11.5 - 15.5 %   Platelets 455 (H) 150 - 400 K/uL  Basic metabolic panel     Status: Abnormal   Collection Time: 10/13/15  5:00 AM  Result Value Ref Range   Sodium 137 135 - 145 mmol/L   Potassium 5.2 (H) 3.5 - 5.1 mmol/L    Comment: DELTA CHECK NOTED   Chloride 103 101 - 111 mmol/L   CO2 26 22 - 32 mmol/L   Glucose, Bld 194 (H) 65 - 99 mg/dL   BUN 15 6 - 20 mg/dL   Creatinine, Ser 1.53 (H) 0.61 - 1.24 mg/dL   Calcium 9.2 8.9 - 10.3 mg/dL   GFR calc non Af Amer 45 (L) >60 mL/min   GFR calc Af Amer 52 (L) >60 mL/min    Comment: (NOTE) The eGFR has been calculated using the CKD EPI equation. This calculation has not been validated in all clinical situations. eGFR's persistently <60 mL/min signify possible Chronic Kidney Disease.    Anion gap 8 5 - 15  Glucose, capillary     Status: Abnormal   Collection Time: 10/13/15  5:56 AM  Result Value Ref Range    Glucose-Capillary 188 (H) 65 - 99 mg/dL    No results found.  Review of Systems  Constitutional: Positive for fever, malaise/fatigue and diaphoresis. Negative for chills.  Respiratory: Positive for shortness of breath.   Cardiovascular: Positive for chest pain. Negative for leg swelling.       Poor circulation in legs  Gastrointestinal: Positive for heartburn. Negative for nausea and vomiting.  Genitourinary: Positive for frequency. Negative for hematuria.  Neurological: Negative for focal weakness and loss of consciousness.  Endo/Heme/Allergies: Bruises/bleeds easily (on plavix).  All other systems reviewed and are negative.  Blood pressure 115/56, pulse 61, temperature 98.3 F (36.8 C), temperature source Oral, resp. rate 16, height '5\' 7"'  (1.702 m), weight 152 lb 14.4 oz (69.355 kg), SpO2 98 %. Physical Exam  Constitutional: He appears well-developed and well-nourished. No distress.  HENT:  Head: Normocephalic and atraumatic.  Eyes: Conjunctivae and EOM are normal. Pupils are equal, round, and reactive to light. No scleral icterus.  Neck: Neck supple. No  thyromegaly present.  Cardiovascular: Normal rate, regular rhythm and normal heart sounds.  Exam reveals no gallop and no friction rub.   No murmur heard. Pulses:      Radial pulses are 2+ on the right side, and 2+ on the left side.       Dorsalis pedis pulses are 0 on the right side, and 0 on the left side.       Posterior tibial pulses are 0 on the right side, and 0 on the left side.  Lymphadenopathy:    He has no cervical adenopathy.    Assessment/Plan: 70 yo man with unstable angina who has been found to have severe 3 vessel CAD at cath.  CABG indicated for survival benefit and relief of symptoms.   The biggest issue is likely to be available conduit. He has PAD. Had a left fem pop bypass years ago with Goretex. 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 Will need vein mapping and doppler assessment of his palmar  arch.  I discussed the general nature of the procedure, the need for general anesthesia, the use of cardiopulmonary bypass, and incisions to be used with the patient and his family. We discussed the expected hospital stay, overall recovery and short and long term outcomes. I reviewed the indications, risks, benefits and alternatives. They understand the risks include, but are not limited to death, stroke, MI, DVT/PE, bleeding, possible need for transfusion, infections, cardiac arrhythmias, and other organ system dysfunction including respiratory, renal, or GI complications.   He accepts the risks and agrees to proceed.  He has been on Plavix for a right leg stent. We need to wait 5-7 days for plavix to wash out of system prior to surgery. Plan OR Monday 3/6  [name] 10/13/2015, 6:54 AM

## 2015-10-13 NOTE — Progress Notes (Signed)
Patient Name: [name] Date of Encounter: 10/13/2015  Primary Cardiologist: Dr. Gwenlyn Found   Principal Problem:   Unstable angina [facility]) Active Problems:   Sepsis ([place])   CAD (coronary artery disease)   Essential hypertension   HLD (hyperlipidemia)   Healthcare-associated pneumonia   Type 2 diabetes mellitus (H[facility])   ASCVD (arteriosclerotic cardiovascular disease)   CKD (chronic kidney disease) stage 2, GFR 60-89 ml/min   Hypomagnesemia    SUBJECTIVE  Denies any CP or SOB. Former smoker, quit many years ago  [place] . aspirin EC  81 mg Oral Daily  . atorvastatin  80 mg Oral q1800  . ceFEPime (MAXIPIME) IV  2 g Intravenous Q24H  . cholecalciferol  2,000 Units Oral Daily  . ferrous sulfate  325 mg Oral QPM  . ferrous sulfate  650 mg Oral Q breakfast  . gabapentin  300 mg Oral TID  . heparin  5,000 Units Subcutaneous 3 times per day  . insulin aspart  0-9 Units Subcutaneous TID WC  . metoprolol tartrate  25 mg Oral BID  . pantoprazole  40 mg Oral Daily  . pneumococcal 23 valent vaccine  0.5 mL Intramuscular Tomorrow-1000  . sodium chloride flush  3 mL Intravenous Q12H    OBJECTIVE  Filed Vitals:   10/12/15 1200 10/12/15 1955 10/13/15 0531 10/13/15 0550  BP: 104/59 115/57 115/56   Pulse: 66 72 61   Temp: 97.7 F (36.5 C) 98 F (36.7 C) 98.3 F (36.8 C)   TempSrc: Oral Oral Oral   Resp: 19 19 16    Height:      Weight:    152 lb 14.4 oz (69.355 kg)  SpO2: 97% 97% 98%     Intake/Output Summary (Last 24 hours) at 10/13/15 1256 Last data filed at 10/13/15 0900  Gross per 24 hour  Intake    720 ml  Output      0 ml  Net    720 ml   Filed Weights   10/11/15 0310 10/12/15 0358 10/13/15 0550  Weight: 152 lb 12.5 oz (69.3 kg) 152 lb 1.9 oz (69 kg) 152 lb 14.4 oz (69.355 kg)    PHYSICAL EXAM  General: Pleasant, NAD. Neuro: Alert and oriented X 3. Moves all extremities spontaneously. Psych: Normal affect. HEENT:  Normal  Neck: Supple without bruits or  JVD. Lungs:  Resp regular and unlabored, CTA. Heart: RRR no s3, s4, or murmurs. Abdomen: Soft, non-tender, non-distended, BS + x 4.  Extremities: No clubbing, cyanosis or edema. DP/PT/Radials 2+ and equal bilaterally.  Accessory Clinical Findings  CBC  Recent Labs  10/12/15 0415 10/13/15 0500  WBC 8.3 8.7  HGB 10.7* 10.7*  HCT 33.7* 34.1*  MCV 84.7 85.5  PLT 476* [id number]*   Basic Metabolic Panel  Recent Labs  10/11/15 0536 10/12/15 0415 10/13/15 0500  NA 137 138 137  K 4.4 4.3 5.2*  CL 103 104 103  CO2 25 23 26   GLUCOSE 154* 133* 194*  BUN 12 12 15   CREATININE 1.25* 1.32* 1.53*  CALCIUM 8.9 8.8* 9.2  MG 2.1 1.9  --    Liver Function Tests  Recent Labs  10/11/15 0536  AST 14*  ALT 11*  ALKPHOS 51  BILITOT 0.3  PROT 6.4*  ALBUMIN 3.2*   Fasting Lipid Panel  Recent Labs  10/11/15 0536  CHOL 110  HDL 27*  LDLCALC 56  TRIG 135  CHOLHDL 4.1    TELE NSR with HR 60-70s    ECG  No new EKG  Echocardiogram 10/10/2015  LV EF: 50%  ------------------------------------------------------------------- Indications:   Chest pain 786.51.  ------------------------------------------------------------------- History:  PMH:  Coronary artery disease. Risk factors: Chronic kidney disease. Sepsis. Hypertension. Diabetes mellitus. Dyslipidemia.  ------------------------------------------------------------------- Study Conclusions  - Left ventricle: The cavity size was normal. Systolic function was very mildly reduced. The estimated ejection fraction was 50%. Mild hypokinesis of the anterior and anterolateral myocardium. Left ventricular diastolic function parameters were normal. - Aortic valve: Transvalvular velocity was within the normal range. There was no stenosis. There was no regurgitation. - Mitral valve: There was trivial regurgitation. - Right ventricle: The cavity size was normal. Wall thickness was normal. Systolic function was  normal. - Tricuspid valve: There was mild regurgitation. - Pulmonary arteries: Systolic pressure was mildly increased. PA peak pressure: 38 mm Hg (S). - Inferior vena cava: The vessel was dilated. The respirophasic diameter changes were blunted (< 50%), consistent with elevated central venous pressure.    Radiology/Studies  Ct Chest Wo Contrast  10/13/2015  CLINICAL DATA:  On stable angina, followup recent chest x-ray EXAM: CT CHEST WITHOUT CONTRAST TECHNIQUE: Multidetector CT imaging of the chest was performed following the standard protocol without IV contrast. COMPARISON:  10/09/2015 FINDINGS: The lungs are well aerated bilaterally. The left lung is clear. The right lung demonstrates some scattered small nodules measuring less than 5 mm. In the right upper lobe however there is a 2.2 cm partially spiculated/partially cavitary lesion identified. This was not present on a prior CT from 20/11. These changes are highly suspicious for underlying neoplasm. The thoracic inlet is within normal limits. The thoracic aorta shows calcification without aneurysmal dilatation. No significant hilar or mediastinal adenopathy is noted. Scanning into the upper abdomen reveals no acute abnormality. The osseous structures are within normal limits. IMPRESSION: Spiculated and cavitary lesion in the right upper lobe as described. PET-CT and/or tissue sampling is recommended. Scattered small nodules within the right lobe all measuring less than 5 mm. If the patient is at high risk for bronchogenic carcinoma, follow-up chest CT at 1 year is recommended. If the patient is at low risk, no follow-up is needed. This recommendation follows the consensus statement: Guidelines for Management of Small Pulmonary Nodules Detected on CT Scans: A Statement from the [facility] as published in Radiology 2005; 237:395-400. Electronically Signed   By: Inez Catalina M.D.   On: 10/13/2015 07:56   Dg Chest Portable 1  View  10/09/2015  CLINICAL DATA:  Acute onset of midsternal chest pain, radiating to the left arm. Initial encounter. EXAM: PORTABLE CHEST 1 VIEW COMPARISON:  Chest radiograph performed 07/09/2015 FINDINGS: The lungs are well-aerated. Mild right midlung zone airspace opacity could reflect mild pneumonia. There is no evidence of pleural effusion or pneumothorax. The cardiomediastinal silhouette is borderline normal in size. No acute osseous abnormalities are seen. IMPRESSION: Mild right midlung zone airspace opacity raises concern for mild pneumonia. If the patient has symptoms of pneumonia, followup PA and lateral chest X-ray is recommended in 3-4 weeks following trial of antibiotic therapy to ensure resolution. Otherwise, CT of the chest could be considered. Electronically Signed   By: Garald Balding M.D.   On: 10/09/2015 03:11    ASSESSMENT AND PLAN  70 yo male with PMH of CAPD, HTN, HLD, DM II, CAD with stent 2003, and PAD with stenting and fem-pop bypass presented with chest pain and fever   1. Unstable angina  - cath 10/11/2015 90% prox RCA, 95% prox to mid RCA, 80% prox  to mid LCx, 85% ost LAD, 85% mid LAD, 70% D1.   - CT surgery consulted, seen by Dr. Roxan Hockey, plan for OR Monday 3/6. Plavix stopped, last dose AM of 2/27. Continue ASA, statin, metoprolol.   - IM discussing with Pulm regarding lesion seen on CT of chest  2. Sepsis 2/2 HCAP  3. CAD s/p stent in 2003: see recent cath report 2/27 above  4. PAD: 3 weeks ago underwent RLE PCI for claudication. Unable to get femoral access so went through his left arm.   5. COPD 6. HTN 7. HLD 8. DM II  Signed, Almyra Deforest PA-C Pager: [phone]  Patient seen and examined. Agree with assessment and plan. No recurrent chest pain.  Had vein mapping earlier. Significant multivessel CAD; on plavix washout.  CABG tentatively planned for 3/6.   Troy Sine, MD, [facility] 10/13/2015 2:24 PM

## 2015-10-13 NOTE — Progress Notes (Signed)
Pre-op Cardiac Surgery  Carotid Findings:  Findings suggest 1-39% internal carotid artery stenosis bilaterally. The right vertebral artery is patent with antegrade flow. The left vertebral artery exhibits atypical flow; etiology unknown.   Upper Extremity Right Left  Brachial Pressures 139-Triphasic 137-Triphasic  Radial Waveforms Triphasic Triphasic  Ulnar Waveforms Triphasic Triphasic  Palmar Arch (Allen's Test) Signal obliterates with radial compression, is unaffected with ulnar compression. Signal obliterates with radial compression, is unaffected with ulnar compression.    Lower  Extremity Right Left  Dorsalis Pedis 65-Dampened monophasic 71-Dampened monophasic  Anterior Tibial    Posterior Tibial 74-Dampened monophasic 94-Dampened monophasic  Ankle/Brachial Indices 0.53 0.68    Findings:   The right ABI is suggestive of moderate, borderline severe, arterial insufficiency at rest. The left ABI is suggestive of moderate arterial insufficiency at rest.   Right Lower Extremity Vein Map    Right Great Saphenous Vein   Segment Diameter Comment  1. Origin 5.71mm   2. High Thigh 4.71mm   3. Mid Thigh 4.61mm   4. Low Thigh 4.75mm   5. At Knee 4.93mm   6. High Calf 3.54mm   7. Low Calf 2.28mm Multiple branches  8. Ankle 2.22mm Multiple branches                Right Small Saphenous Vein  Segment Diameter Comment  1. Origin 2.71mm   2. High Calf 2.19mm   3. Low Calf 2.67mm   4. Ankle 2.6mm                Left Lower Extremity Vein Map    Left Great Saphenous Vein   Segment Diameter Comment  1. Origin  Unable to visualize  2. High Thigh  Unable to visualize  3. Mid Thigh  Unable to visualize  4. Low Thigh  Unable to visualize  5. At Knee  Unable to visualize  6. High Calf 2.48mm   7. Low Calf 2.20mm   8. Ankle 2.39mm                 Left Small Saphenous Vein  Segment Diameter Comment  1. Origin 2.18mm   2. High Calf 2.42mm   3. Low Calf   Unable to visualize  4. Ankle  Unable to visualize                10/13/2015 12:59 PM Maudry Mayhew, RVT, RDCS, RDMS

## 2015-10-13 NOTE — Progress Notes (Signed)
[facility] PROGRESS NOTE  [place] [facility] [id number] DOB: 11/09/1945 DOA: 10/09/2015 PCP: No primary care provider on file.  Assessment/Plan: 1. Chest pain /Unstable angina - Pt has multiple risk factors and known CAD; sxs concerning for unstable angina  - Initial EKG does not feature ischemic changesx2, troponin negative x3 - There is an apparent PNA on CXR, but reported CP symptoms are not pleuritic  -a1c7.9/lipid panel ldl 56, hdl 27/echo result noted - has been on asa/plavix/statin/betablocker, Cath on 2/27 with three vessel disease, need CABG. Plavix d/ced. Cardiology and cardiothoracic surgery following. Cardiothoracic surgery planning on CABG on Monday.   2. Sepsis secondary to HCAP/abnormal CT chest. - Meets sepsis criteria on arrival with tachycardia and leukocytosis; source is pulmonary, organism not yet known  - Empiric vancomycin and Zosyn administered in ED and hydration - Sputum culture requested , not collected, cough has resolved. - lactate, procalcitonin unremarkable  - ua/ urine for strep pneumo/legionella antigensnegative, mrsa screen negative. - d/c vanc on 2/28, continue cefepime, Patient was admitted to the hospital in 06/2015 for pna and sepsis. - CT chest with a spiculated and cavitary lesion in the right upper lobe. Scattered small nodules within the right lobe or measuring less than 5 mm. Abnormal CT scan concerning for primary bronchogenic carcinoma. Will need a PET/CT scan as outpatient. CT surgery aware and following. Patient will likely need to follow-up with pulmonary in the outpatient setting post discharge.    3. ASCVD  - Pt with known CAD, stent placed in 2003; known PAD with multiple angioplasty/stenting, fem-pop bypass  - Continue daily ASA 81, statin. Plavix was d/ced on 2/27 to anticipate CABG, hold ACEi due to borderline bp - Concern for unstable angina as above  - Optimize risk factors  Cardiothoracic surgery and general  surgery following.  4. HTN  - Has 1" nitro paste placed in ED  Blood pressure stable. Continue current regimen of Lopressor.  5. Type II DM , noninsulin dependent - A1c is 7.9 10/09/2015. Continue to hold oral hypoglycemic agents. CBGs 194. - Placed on Levemir 10 units daily. Continue sliding scale insulin.  6. CKD stage II  - SCr 1.47 on admission, up from apparent baseline of ~1.2  - Small bump likely secondary to active infection and subsequent dehydration  - cr fluctuating, Avoid nephrotoxins where possible,ua unremakable  7. Diarrhea ,  reported last two weeks, c diff negative. Resolved.  8. Hypomagnesemia:  Repleted.  Code Status: Full Family Communication: Updated patient. No family present. Disposition Plan: Remaining patient pending cardiac surgery.   Consultants:  Cardiothoracic surgery Dr. Roxan Hockey 10/13/2015  Cardiology Dr. Haroldine Laws 10/09/2015  Procedures:  CT chest 10/13/2015  Chest x-ray 10/09/2015  2-D echo 10/10/2015  Cardiac catheterization 10/11/2015  Antibiotics:  IV cefepime 10/09/2015  IV vancomycin 10/09/2015>>> 10/09/2015  IV Zosyn 10/09/2015>>>> 10/09/2015  HPI/Subjective: Patient denies any chest pain. No shortness of breath. Patient states cough is improved. Patient denies any nausea. No abdominal pain.  Objective: Filed Vitals:   10/12/15 1955 10/13/15 0531  BP: 115/57 115/56  Pulse: 72 61  Temp: 98 F (36.7 C) 98.3 F (36.8 C)  Resp: 19 16    Intake/Output Summary (Last 24 hours) at 10/13/15 1328 Last data filed at 10/13/15 0900  Gross per 24 hour  Intake    480 ml  Output      0 ml  Net    480 ml   Filed Weights   10/11/15 0310 10/12/15 0358 10/13/15 0550  Weight: 69.3  kg (152 lb 12.5 oz) 69 kg (152 lb 1.9 oz) 69.355 kg (152 lb 14.4 oz)    Exam:   General:  NAD  Cardiovascular: RRR  Respiratory: CTAB  Abdomen: Soft, nontender, nondistended, positive bowel sounds.  Musculoskeletal: No clubbing  cyanosis or edema.  Data Reviewed: Basic Metabolic Panel:  Recent Labs Lab 10/09/15 0340 10/09/15 0718 10/10/15 0827 10/11/15 0536 10/12/15 0415 10/13/15 0500  NA 134*  --  137 137 138 137  K 3.6  --  4.6 4.4 4.3 5.2*  CL 102  --  103 103 104 103  CO2 20*  --  22 25 23 26   GLUCOSE 97  --  162* 154* 133* 194*  BUN 23*  --  12 12 12 15   CREATININE 1.47*  --  1.37* 1.25* 1.32* 1.53*  CALCIUM 8.6*  --  9.3 8.9 8.8* 9.2  MG  --  1.4*  --  2.1 1.9  --    Liver Function Tests:  Recent Labs Lab 10/09/15 0340 10/10/15 0827 10/11/15 0536  AST 16 16 14*  ALT 11* 11* 11*  ALKPHOS 59 57 51  BILITOT 0.2* 0.3 0.3  PROT 7.2 6.5 6.4*  ALBUMIN 3.7 3.2* 3.2*   No results for input(s): LIPASE, AMYLASE in the last 168 hours. No results for input(s): AMMONIA in the last 168 hours. CBC:  Recent Labs Lab 10/09/15 0340 10/11/15 0536 10/12/15 0415 10/13/15 0500  WBC 15.1* 7.5 8.3 8.7  NEUTROABS 12.3*  --   --   --   HGB 10.7* 10.4* 10.7* 10.7*  HCT 32.4* 32.0* 33.7* 34.1*  MCV 84.8 84.2 84.7 85.5  PLT 435* 427* 476* 455*   Cardiac Enzymes:  Recent Labs Lab 10/09/15 0340 10/09/15 0718 10/09/15 1021  TROPONINI <0.03 <0.03 <0.03   BNP (last 3 results)  Recent Labs  10/09/15 0718  BNP 15.0    ProBNP (last 3 results) No results for input(s): PROBNP in the last 8760 hours.  CBG:  Recent Labs Lab 10/12/15 1119 10/12/15 1638 10/12/15 1954 10/13/15 0556 10/13/15 1139  GLUCAP 207* 218* 244* 188* 233*    Recent Results (from the past 240 hour(s))  Culture, blood (routine x 2) Call MD if unable to obtain prior to antibiotics being given     Status: None (Preliminary result)   Collection Time: 10/09/15  7:18 AM  Result Value Ref Range Status   Specimen Description BLOOD RIGHT ARM  Final   Special Requests BOTTLES DRAWN AEROBIC AND ANAEROBIC 6CC  Final   Culture NO GROWTH 4 DAYS  Final   Report Status PENDING  Incomplete  Culture, blood (routine x 2) Call MD if  unable to obtain prior to antibiotics being given     Status: None (Preliminary result)   Collection Time: 10/09/15  7:22 AM  Result Value Ref Range Status   Specimen Description BLOOD LEFT ARM  Final   Special Requests BOTTLES DRAWN AEROBIC AND ANAEROBIC 6CC  Final   Culture NO GROWTH 4 DAYS  Final   Report Status PENDING  Incomplete  C difficile quick scan w PCR reflex     Status: None   Collection Time: 10/10/15  4:38 PM  Result Value Ref Range Status   C Diff antigen NEGATIVE NEGATIVE Final   C Diff toxin NEGATIVE NEGATIVE Final   C Diff interpretation Negative for toxigenic C. difficile  Final  MRSA PCR Screening     Status: None   Collection Time: 10/10/15  9:16 PM  Result  Value Ref Range Status   MRSA by PCR NEGATIVE NEGATIVE Final    Comment:        The GeneXpert MRSA Assay (FDA approved for NASAL specimens only), is one component of a comprehensive MRSA colonization surveillance program. It is not intended to diagnose MRSA infection nor to guide or monitor treatment for MRSA infections.      Studies: Ct Chest Wo Contrast  10/13/2015  CLINICAL DATA:  On stable angina, followup recent chest x-ray EXAM: CT CHEST WITHOUT CONTRAST TECHNIQUE: Multidetector CT imaging of the chest was performed following the standard protocol without IV contrast. COMPARISON:  10/09/2015 FINDINGS: The lungs are well aerated bilaterally. The left lung is clear. The right lung demonstrates some scattered small nodules measuring less than 5 mm. In the right upper lobe however there is a 2.2 cm partially spiculated/partially cavitary lesion identified. This was not present on a prior CT from 20/11. These changes are highly suspicious for underlying neoplasm. The thoracic inlet is within normal limits. The thoracic aorta shows calcification without aneurysmal dilatation. No significant hilar or mediastinal adenopathy is noted. Scanning into the upper abdomen reveals no acute abnormality. The osseous  structures are within normal limits. IMPRESSION: Spiculated and cavitary lesion in the right upper lobe as described. PET-CT and/or tissue sampling is recommended. Scattered small nodules within the right lobe all measuring less than 5 mm. If the patient is at high risk for bronchogenic carcinoma, follow-up chest CT at 1 year is recommended. If the patient is at low risk, no follow-up is needed. This recommendation follows the consensus statement: Guidelines for Management of Small Pulmonary Nodules Detected on CT Scans: A Statement from the [place] as published in Radiology 2005; 237:395-400. Electronically Signed   By: Inez Catalina M.D.   On: 10/13/2015 07:56    Scheduled Meds: . aspirin EC  81 mg Oral Daily  . atorvastatin  80 mg Oral q1800  . ceFEPime (MAXIPIME) IV  2 g Intravenous Q24H  . cholecalciferol  2,000 Units Oral Daily  . ferrous sulfate  325 mg Oral QPM  . ferrous sulfate  650 mg Oral Q breakfast  . gabapentin  300 mg Oral TID  . heparin  5,000 Units Subcutaneous 3 times per day  . insulin aspart  0-9 Units Subcutaneous TID WC  . metoprolol tartrate  25 mg Oral BID  . pantoprazole  40 mg Oral Daily  . pneumococcal 23 valent vaccine  0.5 mL Intramuscular Tomorrow-1000  . sodium chloride flush  3 mL Intravenous Q12H   Continuous Infusions:   Principal Problem:   Unstable angina (HCC) Active Problems:   Sepsis (HCC)   CAD (coronary artery disease)   Essential hypertension   HLD (hyperlipidemia)   Healthcare-associated pneumonia   Type 2 diabetes mellitus (HCC)   ASCVD (arteriosclerotic cardiovascular disease)   CKD (chronic kidney disease) stage 2, GFR 60-89 ml/min   Hypomagnesemia   Abnormal chest CT    Time spent: 98 minutes    THOMPSON,DANIEL M.D. [facility] Pager [phone]. If 7PM-7AM, please contact night-coverage at [email or web address], password [facility] 10/13/2015, 1:28 PM  LOS: 4 days

## 2015-10-13 NOTE — Progress Notes (Signed)
Inpatient Diabetes Program Recommendations  AACE/ADA: New Consensus Statement on Inpatient Glycemic Control (2015)  Target Ranges:  Prepandial:   less than 140 mg/dL      Peak postprandial:   less than 180 mg/dL (1-2 hours)      Critically ill patients:  140 - 180 mg/dL   Review of Glycemic Control  Results for SYMEON, SCHEID (MRN [id number]) as of 10/13/2015 18:00  Ref. Range 10/12/2015 16:38 10/12/2015 19:54 10/13/2015 05:56 10/13/2015 11:39 10/13/2015 16:54  Glucose-Capillary Latest Ref Range: 65-99 mg/dL 218 (H) 244 (H) 188 (H) 233 (H) 165 (H)      Inpatient Diabetes Program Recommendations:  Insulin - Basal: Consider addition of Levemir 10 units QHS Insulin - Meal Coverage: Novolog 3 units tidwc for meal coverage insulin    Will continue to follow. Thank you. Lorenda Peck, RD, LDN, CDE Inpatient Diabetes Coordinator [phone]

## 2015-10-14 LAB — CBC
HEMATOCRIT: 35 % — AB (ref 39.0–52.0)
HEMOGLOBIN: 10.9 g/dL — AB (ref 13.0–17.0)
MCH: 26.8 pg (ref 26.0–34.0)
MCHC: 31.1 g/dL (ref 30.0–36.0)
MCV: 86.2 fL (ref 78.0–100.0)
PLATELETS: 464 10*3/uL — AB (ref 150–400)
RBC: 4.06 MIL/uL — AB (ref 4.22–5.81)
RDW: 15 % (ref 11.5–15.5)
WBC: 9.3 10*3/uL (ref 4.0–10.5)

## 2015-10-14 LAB — GLUCOSE, CAPILLARY
GLUCOSE-CAPILLARY: 168 mg/dL — AB (ref 65–99)
Glucose-Capillary: 204 mg/dL — ABNORMAL HIGH (ref 65–99)
Glucose-Capillary: 266 mg/dL — ABNORMAL HIGH (ref 65–99)
Glucose-Capillary: 238 mg/dL — ABNORMAL HIGH (ref 65–99)

## 2015-10-14 LAB — BASIC METABOLIC PANEL
ANION GAP: 6 (ref 5–15)
BUN: 15 mg/dL (ref 6–20)
CHLORIDE: 103 mmol/L (ref 101–111)
CO2: 29 mmol/L (ref 22–32)
Calcium: 9.4 mg/dL (ref 8.9–10.3)
Creatinine, Ser: 1.43 mg/dL — ABNORMAL HIGH (ref 0.61–1.24)
GFR calc Af Amer: 57 mL/min — ABNORMAL LOW (ref >60)
GFR, EST NON AFRICAN AMERICAN: 49 mL/min — AB (ref >60)
GLUCOSE: 235 mg/dL — AB (ref 65–99)
POTASSIUM: 5.4 mmol/L — AB (ref 3.5–5.1)
Sodium: 138 mmol/L (ref 135–145)

## 2015-10-14 LAB — CULTURE, BLOOD (ROUTINE X 2)
Culture: NO GROWTH
Culture: NO GROWTH

## 2015-10-14 MED ORDER — INSULIN ASPART 100 UNIT/ML ~~LOC~~ SOLN
0.0000 [IU] | Freq: Three times a day (TID) | SUBCUTANEOUS | Status: DC
  Administered 2015-10-15: 5 [IU] via SUBCUTANEOUS
  Administered 2015-10-15: 3 [IU] via SUBCUTANEOUS
  Administered 2015-10-15: 8 [IU] via SUBCUTANEOUS
  Administered 2015-10-16: 3 [IU] via SUBCUTANEOUS
  Administered 2015-10-16: 8 [IU] via SUBCUTANEOUS
  Administered 2015-10-16 – 2015-10-17 (×2): 3 [IU] via SUBCUTANEOUS
  Administered 2015-10-17: 5 [IU] via SUBCUTANEOUS
  Administered 2015-10-17: 3 [IU] via SUBCUTANEOUS

## 2015-10-14 MED ORDER — INSULIN DETEMIR 100 UNIT/ML ~~LOC~~ SOLN
14.0000 [IU] | Freq: Every day | SUBCUTANEOUS | Status: DC
  Administered 2015-10-14: 14 [IU] via SUBCUTANEOUS
  Filled 2015-10-14 (×2): qty 0.14

## 2015-10-14 MED ORDER — INSULIN ASPART 100 UNIT/ML ~~LOC~~ SOLN
0.0000 [IU] | Freq: Every day | SUBCUTANEOUS | Status: DC
  Administered 2015-10-14 – 2015-10-15 (×2): 2 [IU] via SUBCUTANEOUS

## 2015-10-14 NOTE — Progress Notes (Signed)
CARDIAC REHAB PHASE I   PRE:  Rate/Rhythm: 76 SR  BP:  Supine:   Sitting: 134/77  Standing:    SaO2: 98%RA  MODE:  Ambulation: 500 ft   POST:  Rate/Rhythm: 73 SR  BP:  Supine:   Sitting: 139/69  Standing:    SaO2: 98%RA 1210-1250 Pt walked 500 ft with steady gait. Has wobbly gait which he stated is normal for him. No CP. Encouraged him to walk short distances in [place] prior to surgery. Discussed importance of IS and mobility after surgery. Discussed sternal precautions. Pt stated he had been asking for IS for days. Told pt he would get one usually with resp tests but since pt requesting one, gave him one. He knows how to use and can get 2516ml. Gave [facility] booklet and care guide and wrote how to view pre op video. Pt will be available to stay with pt once discharged. We will follow up after surgery.   Graylon Good, RN BSN  10/14/2015 12:47 PM

## 2015-10-14 NOTE — Progress Notes (Signed)
Patient Name: [name] Date of Encounter: 10/14/2015  Primary Cardiologist: Dr. Gwenlyn Found   Principal Problem:   Unstable angina [facility]) Active Problems:   Sepsis ([place])   CAD (coronary artery disease)   Essential hypertension   HLD (hyperlipidemia)   Healthcare-associated pneumonia   Type 2 diabetes mellitus (HCC)   ASCVD (arteriosclerotic cardiovascular disease)   CKD (chronic kidney disease) stage 2, GFR 60-89 ml/min   Hypomagnesemia   Abnormal chest CT   Type 2 diabetes, HbA1c goal < 7% (HCC)    SUBJECTIVE  Denies any CP or SOB. Former smoker, quit many years ago  T[place]oper . aspirin EC  81 mg Oral Daily  . atorvastatin  80 mg Oral q1800  . ceFEPime (MAXIPIME) IV  2 g Intravenous Q24H  . cholecalciferol  2,000 Units Oral Daily  . ferrous sulfate  325 mg Oral QPM  . ferrous sulfate  650 mg Oral Q breakfast  . gabapentin  300 mg Oral TID  . heparin  5,000 Units Subcutaneous 3 times per day  . insulin aspart  0-9 Units Subcutaneous TID WC  . insulin detemir  10 Units Subcutaneous QHS  . metoprolol tartrate  25 mg Oral BID  . pantoprazole  40 mg Oral Daily  . pneumococcal 23 valent vaccine  0.5 mL Intramuscular Tomorrow-1000  . sodium chloride flush  3 mL Intravenous Q12H    OBJECTIVE  Filed Vitals:   10/13/15 0550 10/13/15 1500 10/13/15 2033 10/14/15 0409  BP:  125/53 126/62 121/59  Pulse:  59 63 58  Temp:  98.7 F (37.1 C) 98 F (36.7 C) 98.2 F (36.8 C)  TempSrc:  Oral Oral Oral  Resp:  18 18 18   Height:      Weight: 152 lb 14.4 oz (69.355 kg)   152 lb 8.9 oz (69.2 kg)  SpO2:  100% 95% 94%    Intake/Output Summary (Last 24 hours) at 10/14/15 0956 Last data filed at 10/14/15 0911  Gross per 24 hour  Intake    480 ml  Output      0 ml  Net    480 ml   Filed Weights   10/12/15 0358 10/13/15 0550 10/14/15 0409  Weight: 152 lb 1.9 oz (69 kg) 152 lb 14.4 oz (69.355 kg) 152 lb 8.9 oz (69.2 kg)    PHYSICAL EXAM  General: Pleasant, NAD. Neuro:  Alert and oriented X 3. Moves all extremities spontaneously. Psych: Normal affect. HEENT:  Normal  Neck: Supple without bruits or JVD. Lungs:  Resp regular and unlabored, CTA. Heart: RRR no s3, s4, or murmurs. Abdomen: Soft, non-tender, non-distended, BS + x 4.  Extremities: No clubbing, cyanosis or edema. DP/PT/Radials 2+ and equal bilaterally.  Accessory Clinical Findings  CBC  Recent Labs  10/13/15 0500 10/14/15 0400  WBC 8.7 9.3  HGB 10.7* 10.9*  HCT 34.1* 35.0*  MCV 85.5 86.2  PLT 455* [id number]   Basic Metabolic Panel  Recent Labs  10/12/15 0415 10/13/15 0500 10/14/15 0400  NA 138 137 138  K 4.3 5.2* 5.4*  CL 104 103 103  CO2 23 26 29   GLUCOSE 133* 194* 235*  BUN 12 15 15   CREATININE 1.32* 1.53* 1.43*  CALCIUM 8.8* 9.2 9.4  MG 1.9  --   --     TELE NSR with HR 50-70s, single 2.2 sec pause    ECG  No new EKG  Echocardiogram 10/10/2015  LV EF: 50%  ------------------------------------------------------------------- Indications:   Chest pain 786.51.  -------------------------------------------------------------------  History:  PMH:  Coronary artery disease. Risk factors: Chronic kidney disease. Sepsis. Hypertension. Diabetes mellitus. Dyslipidemia.  ------------------------------------------------------------------- Study Conclusions  - Left ventricle: The cavity size was normal. Systolic function was very mildly reduced. The estimated ejection fraction was 50%. Mild hypokinesis of the anterior and anterolateral myocardium. Left ventricular diastolic function parameters were normal. - Aortic valve: Transvalvular velocity was within the normal range. There was no stenosis. There was no regurgitation. - Mitral valve: There was trivial regurgitation. - Right ventricle: The cavity size was normal. Wall thickness was normal. Systolic function was normal. - Tricuspid valve: There was mild regurgitation. - Pulmonary arteries: Systolic  pressure was mildly increased. PA peak pressure: 38 mm Hg (S). - Inferior vena cava: The vessel was dilated. The respirophasic diameter changes were blunted (< 50%), consistent with elevated central venous pressure.    Radiology/Studies  Ct Chest Wo Contrast  10/13/2015  CLINICAL DATA:  On stable angina, followup recent chest x-ray EXAM: CT CHEST WITHOUT CONTRAST TECHNIQUE: Multidetector CT imaging of the chest was performed following the standard protocol without IV contrast. COMPARISON:  10/09/2015 FINDINGS: The lungs are well aerated bilaterally. The left lung is clear. The right lung demonstrates some scattered small nodules measuring less than 5 mm. In the right upper lobe however there is a 2.2 cm partially spiculated/partially cavitary lesion identified. This was not present on a prior CT from 20/11. These changes are highly suspicious for underlying neoplasm. The thoracic inlet is within normal limits. The thoracic aorta shows calcification without aneurysmal dilatation. No significant hilar or mediastinal adenopathy is noted. Scanning into the upper abdomen reveals no acute abnormality. The osseous structures are within normal limits. IMPRESSION: Spiculated and cavitary lesion in the right upper lobe as described. PET-CT and/or tissue sampling is recommended. Scattered small nodules within the right lobe all measuring less than 5 mm. If the patient is at high risk for bronchogenic carcinoma, follow-up chest CT at 1 year is recommended. If the patient is at low risk, no follow-up is needed. This recommendation follows the consensus statement: Guidelines for Management of Small Pulmonary Nodules Detected on CT Scans: A Statement from the [place] as published in Radiology 2005; 237:395-400. Electronically Signed   By: Inez Catalina M.D.   On: 10/13/2015 07:56   Dg Chest Portable 1 View  10/09/2015  CLINICAL DATA:  Acute onset of midsternal chest pain, radiating to the left arm.  Initial encounter. EXAM: PORTABLE CHEST 1 VIEW COMPARISON:  Chest radiograph performed 07/09/2015 FINDINGS: The lungs are well-aerated. Mild right midlung zone airspace opacity could reflect mild pneumonia. There is no evidence of pleural effusion or pneumothorax. The cardiomediastinal silhouette is borderline normal in size. No acute osseous abnormalities are seen. IMPRESSION: Mild right midlung zone airspace opacity raises concern for mild pneumonia. If the patient has symptoms of pneumonia, followup PA and lateral chest X-ray is recommended in 3-4 weeks following trial of antibiotic therapy to ensure resolution. Otherwise, CT of the chest could be considered. Electronically Signed   By: Garald Balding M.D.   On: 10/09/2015 03:11    ASSESSMENT AND PLAN  70 yo male with PMH of CAPD, HTN, HLD, DM II, CAD with stent 2003, and PAD with stenting and fem-pop bypass presented with chest pain and fever   1. Unstable angina  - cath 10/11/2015 90% prox RCA, 95% prox to mid RCA, 80% prox to mid LCx, 85% ost LAD, 85% mid LAD, 70% D1.   - CT surgery consulted, seen by Dr.  Roxan Hockey, plan for OR Monday 3/6. Plavix stopped, last dose AM of 2/27. Continue ASA, statin, metoprolol.   - stable from cardiac perspective, nothing to add at this time. Did have 2.2 sec pause on telemetry, asymptomatic, would not expect further complication once go through with CABG  2. Cavitary lesion seen on CT: former smoker, likely outpatient workup later to r/o lung CA  3. Sepsis 2/2 HCAP    4. CAD s/p stent in 2003: see recent cath report 2/27 above  5. PAD: 3 weeks ago underwent RLE PCI for claudication. Unable to get femoral access so went through his left arm.   6. COPD 7. HTN 8. HLD 9. DM II  Signed, Almyra Deforest PA-C Pager: [phone]   Patient seen and examined. Agree with assessment and plan. No recurrent chest pain. CABG planned for Monday. Hopefully there will be sufficient vein from R leg to graft all targets, but  if not may require PCI RX to RCA.  Spiculated RUL lesion suspicious for bronchogenic CA. Last dose of Plavix was 2/27; undergoing washout.    Troy Sine, MD, [facility] 10/14/2015 12:26 PM

## 2015-10-14 NOTE — Care Management Note (Signed)
Case Management Note Marvetta Gibbons RN, BSN Unit 2W-Case Manager [phone]  Patient Details  Name: [name] MRN: [id number] Date of Birth: 1946/01/16  Subjective/Objective:  Pt admitted with PNA- and chest Dunn found to have 3VD- plan for CABG on 10/18/15                  Action/Plan: PTA pt lived at home with spouse-independent- spoke with pt and wife at bedside- per conversation pt goes to the [place] [facility]-PCP- is [name], nurse at the clinic is [name]- pt reports that he gets all his medications from the [place] clinic- will need to have any new meds or changes to meds sent to clinic for PCP approval- is connected to the [facility]- pt also has UHC/medicare but does not use this for any medications-  CM will follow post surgery for d/c needs.   Expected Discharge Date:             Expected Discharge Plan:  Home/Self Care  In-House Referral:  Clinical Social Work  Discharge planning Services     Post Acute Care Choice:    Choice offered to:     DME Arranged:    DME Agency:     HH Arranged:    [place] Agency:     Status of Service:  In process, will continue to follow  Medicare Important Message Given:    Date Medicare IM Given:    Medicare IM give by:    Date Additional Medicare IM Given:    Additional Medicare Important Message give by:     If discussed at [place] of Stay Meetings, dates discussed:    Additional Comments:  [name], [name], RN 10/14/2015, 10:24 AM

## 2015-10-14 NOTE — Progress Notes (Signed)
Utilization review completed.  

## 2015-10-14 NOTE — Progress Notes (Signed)
[facility] PROGRESS NOTE  JEMEL HARIS [id number] DOB: 02/21/1946 DOA: 10/09/2015 PCP: No primary care provider on file.  Assessment/Plan: CAD/Unstable angina Cath with multivessel disease. For CABG monday  CT chest with RUL mass concerning for neoplasm No infiltrate. Doubt pneumonia. D/c abx. Outpatient PET. Patient reports having a CT chest a few months ago at [place]. Will discuss with PCP in am, Dr. Renie Ora [phone]  4. HTN  - Has 1" nitro paste placed in ED  Blood pressure stable. Continue current regimen of Lopressor.  5. Type II DM , noninsulin dependent - A1c is 7.9 10/09/2015. CBGs not at goal. Increase SSI and lantus. Oral hypoglycemics held while inpatient  6. CKD stage II-III  7. Diarrhea ,  reported last two weeks, c diff negative. Resolved.  8. Hypomagnesemia:  Repleted.  Code Status: Full Family Communication: family at bedside Disposition Plan:    Consultants:  Cardiothoracic surgery Dr. Roxan Hockey 10/13/2015  Cardiology Dr. Haroldine Laws 10/09/2015  Procedures:  CT chest 10/13/2015  Chest x-ray 10/09/2015  2-D echo 10/10/2015  Cardiac catheterization 10/11/2015  Antibiotics:  IV cefepime 10/09/2015  IV vancomycin 10/09/2015>>> 10/09/2015  IV Zosyn 10/09/2015>>>> 10/09/2015  HPI/Subjective: No CP, cough, hemoptysis, weight loss. Started smoking in childhood. Quit 15 years ago. Previously, 1-2 ppd  Objective: Filed Vitals:   10/14/15 0409 10/14/15 1345  BP: 121/59 105/52  Pulse: 58 66  Temp: 98.2 F (36.8 C) 98.7 F (37.1 C)  Resp: 18 17    Intake/Output Summary (Last 24 hours) at 10/14/15 1809 Last data filed at 10/14/15 1736  Gross per 24 hour  Intake   1315 ml  Output      0 ml  Net   1315 ml   Filed Weights   10/12/15 0358 10/13/15 0550 10/14/15 0409  Weight: 69 kg (152 lb 1.9 oz) 69.355 kg (152 lb 14.4 oz) 69.2 kg (152 lb 8.9 oz)    Exam:   General:  NAD  Cardiovascular: RRR without  MGR  Respiratory: CTAB without WRR  Abdomen: Soft, nontender, nondistended, positive bowel sounds.  Musculoskeletal: No clubbing cyanosis or edema.  Data Reviewed: Basic Metabolic Panel:  Recent Labs Lab 10/09/15 0718 10/10/15 0827 10/11/15 0536 10/12/15 0415 10/13/15 0500 10/14/15 0400  NA  --  137 137 138 137 138  K  --  4.6 4.4 4.3 5.2* 5.4*  CL  --  103 103 104 103 103  CO2  --  22 25 23 26 29   GLUCOSE  --  162* 154* 133* 194* 235*  BUN  --  12 12 12 15 15   CREATININE  --  1.37* 1.25* 1.32* 1.53* 1.43*  CALCIUM  --  9.3 8.9 8.8* 9.2 9.4  MG 1.4*  --  2.1 1.9  --   --    Liver Function Tests:  Recent Labs Lab 10/09/15 0340 10/10/15 0827 10/11/15 0536  AST 16 16 14*  ALT 11* 11* 11*  ALKPHOS 59 57 51  BILITOT 0.2* 0.3 0.3  PROT 7.2 6.5 6.4*  ALBUMIN 3.7 3.2* 3.2*   No results for input(s): LIPASE, AMYLASE in the last 168 hours. No results for input(s): AMMONIA in the last 168 hours. CBC:  Recent Labs Lab 10/09/15 0340 10/11/15 0536 10/12/15 0415 10/13/15 0500 10/14/15 0400  WBC 15.1* 7.5 8.3 8.7 9.3  NEUTROABS 12.3*  --   --   --   --   HGB 10.7* 10.4* 10.7* 10.7* 10.9*  HCT 32.4* 32.0* 33.7* 34.1* 35.0*  MCV 84.8 84.2  84.7 85.5 86.2  PLT 435* 427* 476* 455* 464*   Cardiac Enzymes:  Recent Labs Lab 10/09/15 0340 10/09/15 0718 10/09/15 1021  TROPONINI <0.03 <0.03 <0.03   BNP (last 3 results)  Recent Labs  10/09/15 0718  BNP 15.0    ProBNP (last 3 results) No results for input(s): PROBNP in the last 8760 hours.  CBG:  Recent Labs Lab 10/13/15 1654 10/13/15 2036 10/14/15 0647 10/14/15 1156 10/14/15 1622  GLUCAP 165* 215* 168* 204* 266*    Recent Results (from the past 240 hour(s))  Culture, blood (routine x 2) Call MD if unable to obtain prior to antibiotics being given     Status: None   Collection Time: 10/09/15  7:18 AM  Result Value Ref Range Status   Specimen Description BLOOD RIGHT ARM  Final   Special Requests  BOTTLES DRAWN AEROBIC AND ANAEROBIC 6CC  Final   Culture NO GROWTH 5 DAYS  Final   Report Status 10/14/2015 FINAL  Final  Culture, blood (routine x 2) Call MD if unable to obtain prior to antibiotics being given     Status: None   Collection Time: 10/09/15  7:22 AM  Result Value Ref Range Status   Specimen Description BLOOD LEFT ARM  Final   Special Requests BOTTLES DRAWN AEROBIC AND ANAEROBIC 6CC  Final   Culture NO GROWTH 5 DAYS  Final   Report Status 10/14/2015 FINAL  Final  C difficile quick scan w PCR reflex     Status: None   Collection Time: 10/10/15  4:38 PM  Result Value Ref Range Status   C Diff antigen NEGATIVE NEGATIVE Final   C Diff toxin NEGATIVE NEGATIVE Final   C Diff interpretation Negative for toxigenic C. difficile  Final  MRSA PCR Screening     Status: None   Collection Time: 10/10/15  9:16 PM  Result Value Ref Range Status   MRSA by PCR NEGATIVE NEGATIVE Final    Comment:        The GeneXpert MRSA Assay (FDA approved for NASAL specimens only), is one component of a comprehensive MRSA colonization surveillance program. It is not intended to diagnose MRSA infection nor to guide or monitor treatment for MRSA infections.      Studies: Ct Chest Wo Contrast  10/13/2015  CLINICAL DATA:  On stable angina, followup recent chest x-ray EXAM: CT CHEST WITHOUT CONTRAST TECHNIQUE: Multidetector CT imaging of the chest was performed following the standard protocol without IV contrast. COMPARISON:  10/09/2015 FINDINGS: The lungs are well aerated bilaterally. The left lung is clear. The right lung demonstrates some scattered small nodules measuring less than 5 mm. In the right upper lobe however there is a 2.2 cm partially spiculated/partially cavitary lesion identified. This was not present on a prior CT from 20/11. These changes are highly suspicious for underlying neoplasm. The thoracic inlet is within normal limits. The thoracic aorta shows calcification without aneurysmal  dilatation. No significant hilar or mediastinal adenopathy is noted. Scanning into the upper abdomen reveals no acute abnormality. The osseous structures are within normal limits. IMPRESSION: Spiculated and cavitary lesion in the right upper lobe as described. PET-CT and/or tissue sampling is recommended. Scattered small nodules within the right lobe all measuring less than 5 mm. If the patient is at high risk for bronchogenic carcinoma, follow-up chest CT at 1 year is recommended. If the patient is at low risk, no follow-up is needed. This recommendation follows the consensus statement: Guidelines for Management of Small Pulmonary Nodules  Detected on CT Scans: A Statement from the [employer] as published in Radiology 2005; 237:395-400. Electronically Signed   By: Inez Catalina M.D.   On: 10/13/2015 07:56    Scheduled Meds: . aspirin EC  81 mg Oral Daily  . atorvastatin  80 mg Oral q1800  . ceFEPime (MAXIPIME) IV  2 g Intravenous Q24H  . cholecalciferol  2,000 Units Oral Daily  . ferrous sulfate  325 mg Oral QPM  . ferrous sulfate  650 mg Oral Q breakfast  . gabapentin  300 mg Oral TID  . heparin  5,000 Units Subcutaneous 3 times per day  . insulin aspart  0-9 Units Subcutaneous TID WC  . insulin detemir  10 Units Subcutaneous QHS  . metoprolol tartrate  25 mg Oral BID  . pantoprazole  40 mg Oral Daily  . pneumococcal 23 valent vaccine  0.5 mL Intramuscular Tomorrow-1000  . sodium chloride flush  3 mL Intravenous Q12H   Continuous Infusions:   Principal Problem:   Unstable angina (HCC) Active Problems:   Sepsis (HCC)   CAD (coronary artery disease)   Essential hypertension   HLD (hyperlipidemia)   Healthcare-associated pneumonia   Type 2 diabetes mellitus (HCC)   ASCVD (arteriosclerotic cardiovascular disease)   CKD (chronic kidney disease) stage 2, GFR 60-89 ml/min   Hypomagnesemia   Abnormal chest CT   Type 2 diabetes, HbA1c goal < 7% (HCC)    Time spent: 35  minutes    Deliliah Spranger L M.D. [facility] [email or web address], password [facility] 10/14/2015, 6:09 PM  LOS: 5 days

## 2015-10-15 ENCOUNTER — Inpatient Hospital Stay (HOSPITAL_COMMUNITY): Payer: Non-veteran care

## 2015-10-15 ENCOUNTER — Encounter (HOSPITAL_COMMUNITY): Payer: Medicare Other

## 2015-10-15 DIAGNOSIS — R918 Other nonspecific abnormal finding of lung field: Secondary | ICD-10-CM

## 2015-10-15 DIAGNOSIS — I2584 Coronary atherosclerosis due to calcified coronary lesion: Secondary | ICD-10-CM

## 2015-10-15 LAB — BASIC METABOLIC PANEL
ANION GAP: 10 (ref 5–15)
BUN: 16 mg/dL (ref 6–20)
CALCIUM: 9.2 mg/dL (ref 8.9–10.3)
CHLORIDE: 99 mmol/L — AB (ref 101–111)
CO2: 26 mmol/L (ref 22–32)
CREATININE: 1.56 mg/dL — AB (ref 0.61–1.24)
GFR calc non Af Amer: 44 mL/min — ABNORMAL LOW (ref >60)
GFR, EST AFRICAN AMERICAN: 51 mL/min — AB (ref >60)
Glucose, Bld: 264 mg/dL — ABNORMAL HIGH (ref 65–99)
Potassium: 5 mmol/L (ref 3.5–5.1)
SODIUM: 135 mmol/L (ref 135–145)

## 2015-10-15 LAB — GLUCOSE, CAPILLARY
GLUCOSE-CAPILLARY: 198 mg/dL — AB (ref 65–99)
Glucose-Capillary: 289 mg/dL — ABNORMAL HIGH (ref 65–99)
Glucose-Capillary: 239 mg/dL — ABNORMAL HIGH (ref 65–99)
Glucose-Capillary: 213 mg/dL — ABNORMAL HIGH (ref 65–99)

## 2015-10-15 LAB — PULMONARY FUNCTION TEST
DL/VA % PRED: 80 %
DL/VA: 3.49 ml/min/mmHg/L
DLCO UNC % PRED: 61 %
DLCO UNC: 16.49 ml/min/mmHg
FEF 25-75 POST: 4.01 L/s
FEF 25-75 PRE: 2.76 L/s
FEF2575-%CHANGE-POST: 45 %
FEF2575-%PRED-POST: 181 %
FEF2575-%Pred-Pre: 124 %
FEV1-%CHANGE-POST: 5 %
FEV1-%Pred-Post: 101 %
FEV1-%Pred-Pre: 95 %
FEV1-Post: 2.86 L
FEV1-Pre: 2.71 L
FEV1FVC-%CHANGE-POST: 2 %
FEV1FVC-%PRED-PRE: 110 %
FEV6-%CHANGE-POST: 3 %
FEV6-%PRED-POST: 95 %
FEV6-%Pred-Pre: 92 %
FEV6-PRE: 3.33 L
FEV6-Post: 3.44 L
FEV6FVC-%CHANGE-POST: 0 %
FEV6FVC-%PRED-PRE: 106 %
FEV6FVC-%Pred-Post: 106 %
FVC-%CHANGE-POST: 3 %
FVC-%PRED-POST: 89 %
FVC-%Pred-Pre: 86 %
FVC-Post: 3.44 L
FVC-Pre: 3.33 L
POST FEV1/FVC RATIO: 83 %
Post FEV6/FVC ratio: 100 %
Pre FEV1/FVC ratio: 82 %
Pre FEV6/FVC Ratio: 100 %
RV % pred: 80 %
RV: 1.77 L
TLC % pred: 92 %
TLC: 5.77 L

## 2015-10-15 LAB — SURGICAL PCR SCREEN
MRSA, PCR: NEGATIVE
STAPHYLOCOCCUS AUREUS: NEGATIVE

## 2015-10-15 MED ORDER — INSULIN ASPART 100 UNIT/ML ~~LOC~~ SOLN
5.0000 [IU] | Freq: Three times a day (TID) | SUBCUTANEOUS | Status: DC
  Administered 2015-10-15 – 2015-10-17 (×7): 5 [IU] via SUBCUTANEOUS

## 2015-10-15 MED ORDER — ALBUTEROL SULFATE (2.5 MG/3ML) 0.083% IN NEBU
2.5000 mg | INHALATION_SOLUTION | Freq: Once | RESPIRATORY_TRACT | Status: AC
  Administered 2015-10-15: 2.5 mg via RESPIRATORY_TRACT

## 2015-10-15 MED ORDER — INSULIN DETEMIR 100 UNIT/ML ~~LOC~~ SOLN
30.0000 [IU] | Freq: Every day | SUBCUTANEOUS | Status: DC
  Administered 2015-10-15 – 2015-10-17 (×3): 30 [IU] via SUBCUTANEOUS
  Filled 2015-10-15 (×4): qty 0.3

## 2015-10-15 NOTE — Progress Notes (Signed)
Patient given education on heart healthy diet and low sodium, and cooking with less salt [name], Bettina Gavia RN

## 2015-10-15 NOTE — Progress Notes (Signed)
Patient ambulated in hallway with wife, back in room will monitor patient. [name], Bettina Gavia RN

## 2015-10-15 NOTE — Progress Notes (Signed)
4 Days Post-Op Procedure(s) (LRB): Left Heart Cath and Coronary Angiography (N/A) Subjective: No complaints, denies CP  Objective: Vital signs in last 24 hours: Temp:  [97.8 F (36.6 C)-98.8 F (37.1 C)] 98.8 F (37.1 C) (03/03 1325) Pulse Rate:  [64-82] 72 (03/03 1325) Cardiac Rhythm:  [-] Normal sinus rhythm (03/03 0828) Resp:  [18] 18 (03/03 1325) BP: (106-141)/(43-75) 106/43 mmHg (03/03 1325) SpO2:  [94 %-99 %] 99 % (03/03 1325) Weight:  [151 lb 14.4 oz (68.9 kg)] 151 lb 14.4 oz (68.9 kg) (03/03 0546)  Hemodynamic parameters for last 24 hours:    Intake/Output from previous day: 03/02 0701 - 03/03 0700 In: 1315 [P.O.:1315] Out: -  Intake/Output this shift: Total I/O In: 600 [P.O.:600] Out: -   unchanged  Lab Results:  Recent Labs  10/13/15 0500 10/14/15 0400  WBC 8.7 9.3  HGB 10.7* 10.9*  HCT 34.1* 35.0*  PLT 455* 464*   BMET:  Recent Labs  10/14/15 0400 10/15/15 0320  NA 138 135  K 5.4* 5.0  CL 103 99*  CO2 29 26  GLUCOSE 235* 264*  BUN 15 16  CREATININE 1.43* 1.56*  CALCIUM 9.4 9.2    PT/INR: No results for input(s): LABPROT, INR in the last 72 hours. ABG    Component Value Date/Time   PHART 7.427 07/09/2015 0609   HCO3 18.9* 07/09/2015 0609   TCO2 11.7 07/09/2015 0609   O2SAT 94.9 07/09/2015 0609   CBG (last 3)   Recent Labs  10/14/15 2200 10/15/15 0609 10/15/15 1143  GLUCAP 238* 198* 289*    Assessment/Plan: S/P Procedure(s) (LRB): Left Heart Cath and Coronary Angiography (N/A) -  For CABG on Monday His CBG remain poorly controlled- will increase lantus from 14 to 30 and add meal coverage. Need to get CBG better controlled prior to surgery  PFTs- mild COPD   LOS: 6 days    [name] 10/15/2015

## 2015-10-15 NOTE — Progress Notes (Signed)
[facility] PROGRESS NOTE  [name] [id number] DOB: 1946/01/06 DOA: 10/09/2015 PCP: No primary care provider on file.  Assessment/Plan: CAD/Unstable angina Cath with multivessel disease. For CABG monday  CT chest with RUL mass concerning for neoplasm No infiltrate. Doubt pneumonia. Stable off abx. Outpatient PET. Per RN at [facility], RUL lesion was 1 cm on CT in January  4. HTN  stable  5. Type II DM , noninsulin dependent Insulin adjusted by [facility]. Watch for hypoglycemia  6. CKD stage II-III  Code Status: Full Family Communication: family at bedside Disposition Plan:    Consultants:  Cardiothoracic surgery Dr. Roxan Hockey 10/13/2015  Cardiology Dr. Haroldine Laws 10/09/2015  Procedures:  CT chest 10/13/2015  Chest x-ray 10/09/2015  2-D echo 10/10/2015  Cardiac catheterization 10/11/2015  Antibiotics:  IV cefepime 10/09/2015  IV vancomycin 10/09/2015>>> 10/09/2015  IV Zosyn 10/09/2015>>>> 10/09/2015  HPI/Subjective: Had 30 seconds left chest and arm pain while washing up. No CP with ambulation. No cough  Objective: Filed Vitals:   10/15/15 1032 10/15/15 1325  BP: 108/75 106/43  Pulse: 82 72  Temp:  98.8 F (37.1 C)  Resp:  18    Intake/Output Summary (Last 24 hours) at 10/15/15 1634 Last data filed at 10/15/15 1230  Gross per 24 hour  Intake    840 ml  Output      0 ml  Net    840 ml   Filed Weights   10/13/15 0550 10/14/15 0409 10/15/15 0546  Weight: 69.355 kg (152 lb 14.4 oz) 69.2 kg (152 lb 8.9 oz) 68.9 kg (151 lb 14.4 oz)    Exam:   General:  NAD  Cardiovascular: RRR without MGR  Respiratory: CTAB without WRR  Abdomen: Soft, nontender, nondistended, positive bowel sounds.  Musculoskeletal: No clubbing cyanosis or edema.  Data Reviewed: Basic Metabolic Panel:  Recent Labs Lab 10/09/15 0718  10/11/15 0536 10/12/15 0415 10/13/15 0500 10/14/15 0400 10/15/15 0320  NA  --   < > 137 138 137 138 135  K  --   < > 4.4 4.3 5.2*  5.4* 5.0  CL  --   < > 103 104 103 103 99*  CO2  --   < > 25 23 26 29 26   GLUCOSE  --   < > 154* 133* 194* 235* 264*  BUN  --   < > 12 12 15 15 16   CREATININE  --   < > 1.25* 1.32* 1.53* 1.43* 1.56*  CALCIUM  --   < > 8.9 8.8* 9.2 9.4 9.2  MG 1.4*  --  2.1 1.9  --   --   --   < > = values in this interval not displayed. Liver Function Tests:  Recent Labs Lab 10/09/15 0340 10/10/15 0827 10/11/15 0536  AST 16 16 14*  ALT 11* 11* 11*  ALKPHOS 59 57 51  BILITOT 0.2* 0.3 0.3  PROT 7.2 6.5 6.4*  ALBUMIN 3.7 3.2* 3.2*   No results for input(s): LIPASE, AMYLASE in the last 168 hours. No results for input(s): AMMONIA in the last 168 hours. CBC:  Recent Labs Lab 10/09/15 0340 10/11/15 0536 10/12/15 0415 10/13/15 0500 10/14/15 0400  WBC 15.1* 7.5 8.3 8.7 9.3  NEUTROABS 12.3*  --   --   --   --   HGB 10.7* 10.4* 10.7* 10.7* 10.9*  HCT 32.4* 32.0* 33.7* 34.1* 35.0*  MCV 84.8 84.2 84.7 85.5 86.2  PLT 435* 427* 476* 455* 464*   Cardiac Enzymes:  Recent Labs Lab  10/09/15 0340 10/09/15 0718 10/09/15 1021  TROPONINI <0.03 <0.03 <0.03   BNP (last 3 results)  Recent Labs  10/09/15 0718  BNP 15.0    ProBNP (last 3 results) No results for input(s): PROBNP in the last 8760 hours.  CBG:  Recent Labs Lab 10/14/15 1622 10/14/15 2200 10/15/15 0609 10/15/15 1143 10/15/15 1602  GLUCAP 266* 238* 198* 289* 239*    Recent Results (from the past 240 hour(s))  Culture, blood (routine x 2) Call MD if unable to obtain prior to antibiotics being given     Status: None   Collection Time: 10/09/15  7:18 AM  Result Value Ref Range Status   Specimen Description BLOOD RIGHT ARM  Final   Special Requests BOTTLES DRAWN AEROBIC AND ANAEROBIC 6CC  Final   Culture NO GROWTH 5 DAYS  Final   Report Status 10/14/2015 FINAL  Final  Culture, blood (routine x 2) Call MD if unable to obtain prior to antibiotics being given     Status: None   Collection Time: 10/09/15  7:22 AM  Result  Value Ref Range Status   Specimen Description BLOOD LEFT ARM  Final   Special Requests BOTTLES DRAWN AEROBIC AND ANAEROBIC 6CC  Final   Culture NO GROWTH 5 DAYS  Final   Report Status 10/14/2015 FINAL  Final  C difficile quick scan w PCR reflex     Status: None   Collection Time: 10/10/15  4:38 PM  Result Value Ref Range Status   C Diff antigen NEGATIVE NEGATIVE Final   C Diff toxin NEGATIVE NEGATIVE Final   C Diff interpretation Negative for toxigenic C. difficile  Final  MRSA PCR Screening     Status: None   Collection Time: 10/10/15  9:16 PM  Result Value Ref Range Status   MRSA by PCR NEGATIVE NEGATIVE Final    Comment:        The GeneXpert MRSA Assay (FDA approved for NASAL specimens only), is one component of a comprehensive MRSA colonization surveillance program. It is not intended to diagnose MRSA infection nor to guide or monitor treatment for MRSA infections.      Studies: No results found.  Scheduled Meds: . aspirin EC  81 mg Oral Daily  . atorvastatin  80 mg Oral q1800  . cholecalciferol  2,000 Units Oral Daily  . ferrous sulfate  325 mg Oral QPM  . ferrous sulfate  650 mg Oral Q breakfast  . gabapentin  300 mg Oral TID  . heparin  5,000 Units Subcutaneous 3 times per day  . insulin aspart  0-15 Units Subcutaneous TID WC  . insulin aspart  0-5 Units Subcutaneous QHS  . insulin aspart  5 Units Subcutaneous TID WC  . insulin detemir  30 Units Subcutaneous QHS  . metoprolol tartrate  25 mg Oral BID  . pantoprazole  40 mg Oral Daily  . pneumococcal 23 valent vaccine  0.5 mL Intramuscular Tomorrow-1000  . sodium chloride flush  3 mL Intravenous Q12H   Continuous Infusions:   Principal Problem:   Unstable angina (HCC) Active Problems:   Sepsis ([place])   CAD (coronary artery disease)   Essential hypertension   HLD (hyperlipidemia)   Healthcare-associated pneumonia   Type 2 diabetes mellitus (HCC)   ASCVD (arteriosclerotic cardiovascular disease)   CKD  (chronic kidney disease) stage 2, GFR 60-89 ml/min   Hypomagnesemia   Abnormal chest CT   Type 2 diabetes, HbA1c goal < 7% (HCC)    Time spent: 15 minutes  Delfina Redwood M.D. [facility] [email or web address], password [facility] 10/15/2015, 4:34 PM  LOS: 6 days

## 2015-10-15 NOTE — Progress Notes (Signed)
Patient Name: [name] Date of Encounter: 10/15/2015  Principal Problem:   Unstable angina (HCC) Active Problems:   Sepsis ([name])   CAD (coronary artery disease)   Essential hypertension   HLD (hyperlipidemia)   Healthcare-associated pneumonia   Type 2 diabetes mellitus (HCC)   ASCVD (arteriosclerotic cardiovascular disease)   CKD (chronic kidney disease) stage 2, GFR 60-89 ml/min   Hypomagnesemia   Abnormal chest CT   Type 2 diabetes, HbA1c goal < 7% [name])   Primary Cardiologist: Dr Gwenlyn Found Patient Profile: 70 yo male w/ PMH of CAPD, HTN, HLD, DM II, CAD with stent CFX 2003, and PAD with stenting and L fem-pop bypass & R>L fem-fem admitted 02/25 with chest pain and fever. Cath w/ 3v dz, CABG planned 03/06.  SUBJECTIVE: No chest pain, no SOB. Doing well, had PFTs this am.  OBJECTIVE Filed Vitals:   10/14/15 0409 10/14/15 1345 10/14/15 2202 10/15/15 0546  BP: 121/59 105/52 112/69 141/59  Pulse: 58 66 69 64  Temp: 98.2 F (36.8 C) 98.7 F (37.1 C) 97.9 F (36.6 C) 97.8 F (36.6 C)  TempSrc: Oral Oral Oral Oral  Resp: 18 17 18 18   Height:      Weight: 152 lb 8.9 oz (69.2 kg)   151 lb 14.4 oz (68.9 kg)  SpO2: 94% 97% 94% 98%    Intake/Output Summary (Last 24 hours) at 10/15/15 0825 Last data filed at 10/14/15 1736  Gross per 24 hour  Intake   1315 ml  Output      0 ml  Net   1315 ml   Filed Weights   10/13/15 0550 10/14/15 0409 10/15/15 0546  Weight: 152 lb 14.4 oz (69.355 kg) 152 lb 8.9 oz (69.2 kg) 151 lb 14.4 oz (68.9 kg)    PHYSICAL EXAM General: Well developed, well nourished, male in no acute distress. Head: Normocephalic, atraumatic.  Neck: Supple without bruits, JVD not elevated. Lungs:  Resp regular and unlabored, few rales, scattered. Heart: RRR, S1, S2, no S3, S4, or murmur; no rub. Abdomen: Soft, non-tender, non-distended, BS + x 4.  Extremities: No clubbing, cyanosis, edema.  Neuro: Alert and oriented X 3. Moves all extremities  spontaneously. Psych: Normal affect.  LABS: CBC: Recent Labs  10/13/15 0500 10/14/15 0400  WBC 8.7 9.3  HGB 10.7* 10.9*  HCT 34.1* 35.0*  MCV 85.5 86.2  PLT 455* [id number]*   Basic Metabolic Panel: Recent Labs  10/14/15 0400 10/15/15 0320  NA 138 135  K 5.4* 5.0  CL 103 99*  CO2 29 26  GLUCOSE 235* 264*  BUN 15 16  CREATININE 1.43* 1.56*  CALCIUM 9.4 9.2   BNP:  B NATRIURETIC PEPTIDE  Date/Time Value Ref Range Status  10/09/2015 07:18 AM 15.0 0.0 - 100.0 pg/mL Final   TELE: SR, occ PVCs       Current Medications:  . aspirin EC  81 mg Oral Daily  . atorvastatin  80 mg Oral q1800  . cholecalciferol  2,000 Units Oral Daily  . ferrous sulfate  325 mg Oral QPM  . ferrous sulfate  650 mg Oral Q breakfast  . gabapentin  300 mg Oral TID  . heparin  5,000 Units Subcutaneous 3 times per day  . insulin aspart  0-15 Units Subcutaneous TID WC  . insulin aspart  0-5 Units Subcutaneous QHS  . insulin detemir  14 Units Subcutaneous QHS  . metoprolol tartrate  25 mg Oral BID  . pantoprazole  40 mg Oral  Daily  . pneumococcal 23 valent vaccine  0.5 mL Intramuscular Tomorrow-1000  . sodium chloride flush  3 mL Intravenous Q12H      ASSESSMENT AND PLAN: 1. Unstable angina - cath 10/11/2015 90% prox RCA, 95% prox to mid RCA, 80% prox to mid LCx, 85% ost LAD, 85% mid LAD, 70% D1.  - CT surgery consulted, seen by Dr. Roxan Hockey, plan for OR Monday 3/6. Plavix stopped, last dose AM of 2/27. Continue ASA, statin, metoprolol.  - stable from cardiac perspective, nothing to add at this time. Did have 2.2 sec pause on telemetry, asymptomatic, would not expect further complication once go through with CABG  2. Cavitary lesion seen on CT: former smoker, likely outpatient workup later to r/o lung CA  3. Sepsis 2/2 HCAP, per IM   4. CAD s/p stent in 2003: see recent cath report 2/27 above  5. PAD: 3 weeks ago underwent RLE PCI for claudication. Unable  to get femoral access so went through his left arm.     Otherwise, per IM Principal Problem:   Unstable angina (HCC) Active Problems:   Sepsis ([name])   CAD (coronary artery disease)   Essential hypertension   HLD (hyperlipidemia)   Healthcare-associated pneumonia   Type 2 diabetes mellitus (HCC)   ASCVD (arteriosclerotic cardiovascular disease)   CKD (chronic kidney disease) stage 2, GFR 60-89 ml/min   Hypomagnesemia   Abnormal chest CT   Type 2 diabetes, HbA1c goal < 7% (HCC)   Signed, Barrett, Rhonda , PA-C 8:25 AM 10/15/2015   Patient seen and examined. Agree with assessment and plan.No recurrent chest pain.  Pulse in the 60's on metoprolol 25 bid. O2 sat normal. According to wife pt had night sweats last night.  On effent washout; for CABG on Monday with suspequent w/u for spiculated lung lesion    Troy Sine, MD, [facility] 10/15/2015 10:13 AM

## 2015-10-16 DIAGNOSIS — I200 Unstable angina: Secondary | ICD-10-CM

## 2015-10-16 LAB — GLUCOSE, CAPILLARY
GLUCOSE-CAPILLARY: 192 mg/dL — AB (ref 65–99)
Glucose-Capillary: 190 mg/dL — ABNORMAL HIGH (ref 65–99)
Glucose-Capillary: 295 mg/dL — ABNORMAL HIGH (ref 65–99)
Glucose-Capillary: 199 mg/dL — ABNORMAL HIGH (ref 65–99)

## 2015-10-16 NOTE — Progress Notes (Signed)
Patient ID: [name], male   DOB: 12/09/1945, 70 y.o.   MRN: [id number]    Patient for CABG on Monday, no further cardiology recs over the weekend. Please call with questions.   Zandra Abts MD

## 2015-10-16 NOTE — Progress Notes (Signed)
[facility] PROGRESS NOTE  [name] [id number] DOB: Jan 05, 1946 DOA: 10/09/2015 PCP: No primary care provider on file.  Assessment/Plan: CAD/Unstable angina Cath with multivessel disease. For CABG monday  CT chest with RUL mass concerning for neoplasm No infiltrate. Doubt pneumonia. Stable off abx. Outpatient PET. Per RN at [facility], RUL lesion was 1 cm on CT in January  4. HTN  stable  5. Type II DM , noninsulin dependent Insulin adjusted by TCTS. Adjust prn  6. CKD stage II-III  Code Status: Full Family Communication: family at bedside Disposition Plan:    Consultants:  Cardiothoracic surgery Dr. Roxan Hockey 10/13/2015  Cardiology Dr. Haroldine Laws 10/09/2015  Procedures:  CT chest 10/13/2015  Chest x-ray 10/09/2015  2-D echo 10/10/2015  Cardiac catheterization 10/11/2015  Antibiotics:  IV cefepime 10/09/2015  IV vancomycin 10/09/2015>>> 10/09/2015  IV Zosyn 10/09/2015>>>> 10/09/2015  HPI/Subjective: No CP. No complaints  Objective: Filed Vitals:   10/15/15 2102 10/16/15 0550  BP: 122/54 107/52  Pulse: 71 66  Temp: 98.4 F (36.9 C) 98.5 F (36.9 C)  Resp: 18 18    Intake/Output Summary (Last 24 hours) at 10/16/15 1409 Last data filed at 10/16/15 0730  Gross per 24 hour  Intake    720 ml  Output      0 ml  Net    720 ml   Filed Weights   10/14/15 0409 10/15/15 0546 10/16/15 0550  Weight: 69.2 kg (152 lb 8.9 oz) 68.9 kg (151 lb 14.4 oz) 68.675 kg (151 lb 6.4 oz)    Exam:   General:  NAD  Cardiovascular: RRR without MGR  Respiratory: CTAB without WRR  Abdomen: Soft, nontender, nondistended, positive bowel sounds.  Musculoskeletal: No clubbing cyanosis or edema.  Data Reviewed: Basic Metabolic Panel:  Recent Labs Lab 10/11/15 0536 10/12/15 0415 10/13/15 0500 10/14/15 0400 10/15/15 0320  NA 137 138 137 138 135  K 4.4 4.3 5.2* 5.4* 5.0  CL 103 104 103 103 99*  CO2 25 23 26 29 26   GLUCOSE 154* 133* 194* 235* 264*  BUN  12 12 15 15 16   CREATININE 1.25* 1.32* 1.53* 1.43* 1.56*  CALCIUM 8.9 8.8* 9.2 9.4 9.2  MG 2.1 1.9  --   --   --    Liver Function Tests:  Recent Labs Lab 10/10/15 0827 10/11/15 0536  AST 16 14*  ALT 11* 11*  ALKPHOS 57 51  BILITOT 0.3 0.3  PROT 6.5 6.4*  ALBUMIN 3.2* 3.2*   No results for input(s): LIPASE, AMYLASE in the last 168 hours. No results for input(s): AMMONIA in the last 168 hours. CBC:  Recent Labs Lab 10/11/15 0536 10/12/15 0415 10/13/15 0500 10/14/15 0400  WBC 7.5 8.3 8.7 9.3  HGB 10.4* 10.7* 10.7* 10.9*  HCT 32.0* 33.7* 34.1* 35.0*  MCV 84.2 84.7 85.5 86.2  PLT 427* 476* 455* 464*   Cardiac Enzymes: No results for input(s): CKTOTAL, CKMB, CKMBINDEX, TROPONINI in the last 168 hours. BNP (last 3 results)  Recent Labs  10/09/15 0718  BNP 15.0    ProBNP (last 3 results) No results for input(s): PROBNP in the last 8760 hours.  CBG:  Recent Labs Lab 10/15/15 1143 10/15/15 1602 10/15/15 2126 10/16/15 0630 10/16/15 1126  GLUCAP 289* 239* 213* 190* 192*    Recent Results (from the past 240 hour(s))  Culture, blood (routine x 2) Call MD if unable to obtain prior to antibiotics being given     Status: None   Collection Time: 10/09/15  7:18 AM  Result Value Ref Range Status   Specimen Description BLOOD RIGHT ARM  Final   Special Requests BOTTLES DRAWN AEROBIC AND ANAEROBIC 6CC  Final   Culture NO GROWTH 5 DAYS  Final   Report Status 10/14/2015 FINAL  Final  Culture, blood (routine x 2) Call MD if unable to obtain prior to antibiotics being given     Status: None   Collection Time: 10/09/15  7:22 AM  Result Value Ref Range Status   Specimen Description BLOOD LEFT ARM  Final   Special Requests BOTTLES DRAWN AEROBIC AND ANAEROBIC 6CC  Final   Culture NO GROWTH 5 DAYS  Final   Report Status 10/14/2015 FINAL  Final  C difficile quick scan w PCR reflex     Status: None   Collection Time: 10/10/15  4:38 PM  Result Value Ref Range Status   C  Diff antigen NEGATIVE NEGATIVE Final   C Diff toxin NEGATIVE NEGATIVE Final   C Diff interpretation Negative for toxigenic C. difficile  Final  MRSA PCR Screening     Status: None   Collection Time: 10/10/15  9:16 PM  Result Value Ref Range Status   MRSA by PCR NEGATIVE NEGATIVE Final    Comment:        The GeneXpert MRSA Assay (FDA approved for NASAL specimens only), is one component of a comprehensive MRSA colonization surveillance program. It is not intended to diagnose MRSA infection nor to guide or monitor treatment for MRSA infections.   Surgical pcr screen     Status: None   Collection Time: 10/15/15  4:11 PM  Result Value Ref Range Status   MRSA, PCR NEGATIVE NEGATIVE Final   Staphylococcus aureus NEGATIVE NEGATIVE Final    Comment:        The Xpert SA Assay (FDA approved for NASAL specimens in patients over 23 years of age), is one component of a comprehensive surveillance program.  Test performance has been validated by [facility] for patients greater than or equal to 60 year old. It is not intended to diagnose infection nor to guide or monitor treatment.      Studies: No results found.  Scheduled Meds: . aspirin EC  81 mg Oral Daily  . atorvastatin  80 mg Oral q1800  . cholecalciferol  2,000 Units Oral Daily  . ferrous sulfate  325 mg Oral QPM  . ferrous sulfate  650 mg Oral Q breakfast  . gabapentin  300 mg Oral TID  . heparin  5,000 Units Subcutaneous 3 times per day  . insulin aspart  0-15 Units Subcutaneous TID WC  . insulin aspart  0-5 Units Subcutaneous QHS  . insulin aspart  5 Units Subcutaneous TID WC  . insulin detemir  30 Units Subcutaneous QHS  . metoprolol tartrate  25 mg Oral BID  . pantoprazole  40 mg Oral Daily  . pneumococcal 23 valent vaccine  0.5 mL Intramuscular Tomorrow-1000  . sodium chloride flush  3 mL Intravenous Q12H   Continuous Infusions:   Principal Problem:   Unstable angina (HCC) Active Problems:   Sepsis ([place])    CAD (coronary artery disease)   Essential hypertension   HLD (hyperlipidemia)   Healthcare-associated pneumonia   Type 2 diabetes mellitus (HCC)   ASCVD (arteriosclerotic cardiovascular disease)   CKD (chronic kidney disease) stage 2, GFR 60-89 ml/min   Hypomagnesemia   Abnormal chest CT   Type 2 diabetes, HbA1c goal < 7% (HCC)    Time spent: 15 minutes  Delfina Redwood M.D. [facility] [email or web address], password [facility] 10/16/2015, 2:09 PM  LOS: 7 days

## 2015-10-17 LAB — GLUCOSE, CAPILLARY
GLUCOSE-CAPILLARY: 163 mg/dL — AB (ref 65–99)
GLUCOSE-CAPILLARY: 176 mg/dL — AB (ref 65–99)
GLUCOSE-CAPILLARY: 197 mg/dL — AB (ref 65–99)
Glucose-Capillary: 211 mg/dL — ABNORMAL HIGH (ref 65–99)

## 2015-10-17 MED ORDER — DEXMEDETOMIDINE HCL IN NACL 400 MCG/100ML IV SOLN
0.1000 ug/kg/h | INTRAVENOUS | Status: AC
Start: 2015-10-18 — End: 2015-10-18
  Administered 2015-10-18: .2 ug/kg/h via INTRAVENOUS
  Filled 2015-10-17: qty 100

## 2015-10-17 MED ORDER — CHLORHEXIDINE GLUCONATE 0.12 % MT SOLN
15.0000 mL | Freq: Once | OROMUCOSAL | Status: AC
  Administered 2015-10-18: 15 mL via OROMUCOSAL
  Filled 2015-10-17: qty 15

## 2015-10-17 MED ORDER — EPINEPHRINE HCL 1 MG/ML IJ SOLN
0.0000 ug/min | INTRAVENOUS | Status: DC
  Filled 2015-10-17: qty 4

## 2015-10-17 MED ORDER — SODIUM CHLORIDE 0.9 % IV SOLN
INTRAVENOUS | Status: DC
  Administered 2015-10-18: 69.8 mL/h via INTRAVENOUS
  Filled 2015-10-17: qty 40

## 2015-10-17 MED ORDER — PHENYLEPHRINE HCL 10 MG/ML IJ SOLN
30.0000 ug/min | INTRAVENOUS | Status: AC
  Administered 2015-10-18: 50 ug/min via INTRAVENOUS
  Filled 2015-10-17: qty 2

## 2015-10-17 MED ORDER — DOPAMINE-DEXTROSE 3.2-5 MG/ML-% IV SOLN
0.0000 ug/kg/min | INTRAVENOUS | Status: DC
  Filled 2015-10-17: qty 250

## 2015-10-17 MED ORDER — CHLORHEXIDINE GLUCONATE 4 % EX LIQD
60.0000 mL | Freq: Once | CUTANEOUS | Status: AC
  Administered 2015-10-18: 4 via TOPICAL
  Filled 2015-10-17: qty 60

## 2015-10-17 MED ORDER — VANCOMYCIN HCL 10 G IV SOLR
1250.0000 mg | INTRAVENOUS | Status: AC
  Administered 2015-10-18: 1250 mg via INTRAVENOUS
  Filled 2015-10-17 (×2): qty 1250

## 2015-10-17 MED ORDER — SODIUM CHLORIDE 0.9 % IV SOLN
INTRAVENOUS | Status: DC
  Filled 2015-10-17: qty 30

## 2015-10-17 MED ORDER — CHLORHEXIDINE GLUCONATE 4 % EX LIQD
60.0000 mL | Freq: Once | CUTANEOUS | Status: AC
  Administered 2015-10-17: 4 via TOPICAL
  Filled 2015-10-17: qty 60

## 2015-10-17 MED ORDER — BISACODYL 5 MG PO TBEC
5.0000 mg | DELAYED_RELEASE_TABLET | Freq: Once | ORAL | Status: AC
  Administered 2015-10-17: 5 mg via ORAL
  Filled 2015-10-17: qty 1

## 2015-10-17 MED ORDER — NITROGLYCERIN IN D5W 200-5 MCG/ML-% IV SOLN
2.0000 ug/min | INTRAVENOUS | Status: AC
  Administered 2015-10-18: 5 ug/min via INTRAVENOUS
  Filled 2015-10-17: qty 250

## 2015-10-17 MED ORDER — MAGNESIUM SULFATE 50 % IJ SOLN
40.0000 meq | INTRAMUSCULAR | Status: DC
  Filled 2015-10-17: qty 10

## 2015-10-17 MED ORDER — DIAZEPAM 5 MG PO TABS
5.0000 mg | ORAL_TABLET | Freq: Once | ORAL | Status: AC
  Administered 2015-10-18: 5 mg via ORAL
  Filled 2015-10-17: qty 1

## 2015-10-17 MED ORDER — TEMAZEPAM 15 MG PO CAPS: 15.0000 mg | ORAL_CAPSULE | Freq: Once | ORAL | Status: DC | PRN

## 2015-10-17 MED ORDER — METOPROLOL TARTRATE 12.5 MG HALF TABLET
12.5000 mg | ORAL_TABLET | Freq: Once | ORAL | Status: AC
  Administered 2015-10-18: 12.5 mg via ORAL
  Filled 2015-10-17: qty 1

## 2015-10-17 MED ORDER — CEFUROXIME SODIUM 750 MG IJ SOLR
750.0000 mg | INTRAMUSCULAR | Status: DC
  Filled 2015-10-17: qty 750

## 2015-10-17 MED ORDER — INSULIN REGULAR HUMAN 100 UNIT/ML IJ SOLN
INTRAMUSCULAR | Status: AC
  Administered 2015-10-18: 1 [IU]/h via INTRAVENOUS
  Filled 2015-10-17: qty 2.5

## 2015-10-17 MED ORDER — DEXTROSE 5 % IV SOLN
1.5000 g | INTRAVENOUS | Status: AC
  Administered 2015-10-18: 1.5 g via INTRAVENOUS
  Administered 2015-10-18: .75 g via INTRAVENOUS
  Filled 2015-10-17: qty 1.5

## 2015-10-17 MED ORDER — PLASMA-LYTE 148 IV SOLN
INTRAVENOUS | Status: AC
  Administered 2015-10-18: 500 mL
  Filled 2015-10-17: qty 2.5

## 2015-10-17 MED ORDER — POTASSIUM CHLORIDE 2 MEQ/ML IV SOLN
80.0000 meq | INTRAVENOUS | Status: DC
  Filled 2015-10-17: qty 40

## 2015-10-17 NOTE — Anesthesia Preprocedure Evaluation (Addendum)
Anesthesia Evaluation  Patient identified by MRN, date of birth, ID band Patient awake    Reviewed: Allergy & Precautions, NPO status , Patient's Chart, lab work & pertinent test results, reviewed documented beta blocker date and time   Airway Mallampati: II  TM Distance: >3 FB Neck ROM: Full    Dental  (+) Teeth Intact, Partial Upper   Pulmonary former smoker,    breath sounds clear to auscultation       Cardiovascular hypertension, Pt. on medications and Pt. on home beta blockers + angina + CAD and + Peripheral Vascular Disease   Rhythm:Regular Rate:Normal     Neuro/Psych  Neuromuscular disease negative psych ROS   GI/Hepatic GERD  ,  Endo/Other  diabetes, Type 1, Insulin Dependent  Renal/GU Renal disease  negative genitourinary   Musculoskeletal negative musculoskeletal ROS (+)   Abdominal   Peds negative pediatric ROS (+)  Hematology negative hematology ROS (+)   Anesthesia Other Findings   Reproductive/Obstetrics negative OB ROS                           Lab Results  Component Value Date   WBC 9.3 10/14/2015   HGB 10.9* 10/14/2015   HCT 35.0* 10/14/2015   MCV 86.2 10/14/2015   PLT 464* 10/14/2015   Lab Results  Component Value Date   CREATININE 1.56* 10/15/2015   BUN 16 10/15/2015   NA 135 10/15/2015   K 5.0 10/15/2015   CL 99* 10/15/2015   CO2 26 10/15/2015   Lab Results  Component Value Date   INR 1.07 10/09/2015   INR 1.0 06/24/2007   09/2015 Echo - Left ventricle: The cavity size was normal. Systolic function was very mildly reduced. The estimated ejection fraction was 50%. Mild hypokinesis of the anterior and anterolateral myocardium. Left ventricular diastolic function parameters were normal. - Aortic valve: Transvalvular velocity was within the normal range. There was no stenosis. There was no regurgitation. - Mitral valve: There was trivial regurgitation. -  Right ventricle: The cavity size was normal. Wall thickness was normal. Systolic function was normal. - Tricuspid valve: There was mild regurgitation. - Pulmonary arteries: Systolic pressure was mildly increased. PA peak pressure: 38 mm Hg (S). - Inferior vena cava: The vessel was dilated. The respirophasic diameter changes were blunted (< 50%), consistent with elevated central venous pressure.    Anesthesia Physical Anesthesia Plan  ASA: IV  Anesthesia Plan: General   Post-op Pain Management:    Induction: Intravenous  Airway Management Planned: Oral ETT  Additional Equipment: Arterial line, CVP, PA Cath and TEE  Intra-op Plan:   Post-operative Plan: Post-operative intubation/ventilation  Informed Consent: I have reviewed the patients History and Physical, chart, labs and discussed the procedure including the risks, benefits and alternatives for the proposed anesthesia with the patient or authorized representative who has indicated his/her understanding and acceptance.   Dental advisory given  Plan Discussed with: CRNA  Anesthesia Plan Comments:         Anesthesia Quick Evaluation

## 2015-10-17 NOTE — Progress Notes (Signed)
[facility] PROGRESS NOTE  [name] [id number] DOB: 24-Oct-1945 DOA: 10/09/2015 PCP: No primary care provider on file.  Assessment/Plan: CAD/Unstable angina Cath with multivessel disease. For CABG monday  CT chest with RUL mass concerning for neoplasm No infiltrate. Doubt pneumonia. Stable off abx. Outpatient PET. Per RN at [facility], RUL lesion was 1 cm on CT in January  4. HTN  stable  5. Type II DM , noninsulin dependent Continue current  6. CKD stage II-III  Code Status: Full Family Communication: family at bedside Disposition Plan:    Consultants:  Cardiothoracic surgery Dr. Roxan Hockey 10/13/2015  Cardiology Dr. Haroldine Laws 10/09/2015  Procedures:  CT chest 10/13/2015  Chest x-ray 10/09/2015  2-D echo 10/10/2015  Cardiac catheterization 10/11/2015  Antibiotics:  IV cefepime 10/09/2015  IV vancomycin 10/09/2015>>> 10/09/2015  IV Zosyn 10/09/2015>>>> 10/09/2015  HPI/Subjective: No CP. No complaints  Objective: Filed Vitals:   10/17/15 0531 10/17/15 1500  BP: 125/63 126/54  Pulse: 66 66  Temp: 98.8 F (37.1 C) 98.2 F (36.8 C)  Resp: 18 18    Intake/Output Summary (Last 24 hours) at 10/17/15 1632 Last data filed at 10/17/15 1300  Gross per 24 hour  Intake    240 ml  Output      1 ml  Net    239 ml   Filed Weights   10/15/15 0546 10/16/15 0550 10/17/15 0531  Weight: 68.9 kg (151 lb 14.4 oz) 68.675 kg (151 lb 6.4 oz) 69.037 kg (152 lb 3.2 oz)    Exam:   General:  NAD  Cardiovascular: RRR without MGR  Respiratory: CTAB without WRR  Abdomen: Soft, nontender, nondistended, positive bowel sounds.  Musculoskeletal: No clubbing cyanosis or edema.  Data Reviewed: Basic Metabolic Panel:  Recent Labs Lab 10/11/15 0536 10/12/15 0415 10/13/15 0500 10/14/15 0400 10/15/15 0320  NA 137 138 137 138 135  K 4.4 4.3 5.2* 5.4* 5.0  CL 103 104 103 103 99*  CO2 25 23 26 29 26   GLUCOSE 154* 133* 194* 235* 264*  BUN 12 12 15 15 16    CREATININE 1.25* 1.32* 1.53* 1.43* 1.56*  CALCIUM 8.9 8.8* 9.2 9.4 9.2  MG 2.1 1.9  --   --   --    Liver Function Tests:  Recent Labs Lab 10/11/15 0536  AST 14*  ALT 11*  ALKPHOS 51  BILITOT 0.3  PROT 6.4*  ALBUMIN 3.2*   No results for input(s): LIPASE, AMYLASE in the last 168 hours. No results for input(s): AMMONIA in the last 168 hours. CBC:  Recent Labs Lab 10/11/15 0536 10/12/15 0415 10/13/15 0500 10/14/15 0400  WBC 7.5 8.3 8.7 9.3  HGB 10.4* 10.7* 10.7* 10.9*  HCT 32.0* 33.7* 34.1* 35.0*  MCV 84.2 84.7 85.5 86.2  PLT 427* 476* 455* 464*   Cardiac Enzymes: No results for input(s): CKTOTAL, CKMB, CKMBINDEX, TROPONINI in the last 168 hours. BNP (last 3 results)  Recent Labs  10/09/15 0718  BNP 15.0    ProBNP (last 3 results) No results for input(s): PROBNP in the last 8760 hours.  CBG:  Recent Labs Lab 10/16/15 1126 10/16/15 1559 10/16/15 2125 10/17/15 0628 10/17/15 1217  GLUCAP 192* 295* 199* 163* 176*    Recent Results (from the past 240 hour(s))  Culture, blood (routine x 2) Call MD if unable to obtain prior to antibiotics being given     Status: None   Collection Time: 10/09/15  7:18 AM  Result Value Ref Range Status   Specimen Description BLOOD RIGHT ARM  Final   Special Requests BOTTLES DRAWN AEROBIC AND ANAEROBIC [place]  Final   Culture NO GROWTH 5 DAYS  Final   Report Status 10/14/2015 FINAL  Final  Culture, blood (routine x 2) Call MD if unable to obtain prior to antibiotics being given     Status: None   Collection Time: 10/09/15  7:22 AM  Result Value Ref Range Status   Specimen Description BLOOD LEFT ARM  Final   Special Requests BOTTLES DRAWN AEROBIC AND ANAEROBIC 6CC  Final   Culture NO GROWTH 5 DAYS  Final   Report Status 10/14/2015 FINAL  Final  C difficile quick scan w PCR reflex     Status: None   Collection Time: 10/10/15  4:38 PM  Result Value Ref Range Status   C Diff antigen NEGATIVE NEGATIVE Final   C Diff toxin  NEGATIVE NEGATIVE Final   C Diff interpretation Negative for toxigenic C. difficile  Final  MRSA PCR Screening     Status: None   Collection Time: 10/10/15  9:16 PM  Result Value Ref Range Status   MRSA by PCR NEGATIVE NEGATIVE Final    Comment:        The GeneXpert MRSA Assay (FDA approved for NASAL specimens only), is one component of a comprehensive MRSA colonization surveillance program. It is not intended to diagnose MRSA infection nor to guide or monitor treatment for MRSA infections.   Surgical pcr screen     Status: None   Collection Time: 10/15/15  4:11 PM  Result Value Ref Range Status   MRSA, PCR NEGATIVE NEGATIVE Final   Staphylococcus aureus NEGATIVE NEGATIVE Final    Comment:        The Xpert SA Assay (FDA approved for NASAL specimens in patients over 81 years of age), is one component of a comprehensive surveillance program.  Test performance has been validated by [facility] for patients greater than or equal to 57 year old. It is not intended to diagnose infection nor to guide or monitor treatment.      Studies: No results found.  Scheduled Meds: . [START ON 10/18/2015] aminocaproic acid (AMICAR) for OHS   Intravenous To OR  . aspirin EC  81 mg Oral Daily  . atorvastatin  80 mg Oral q1800  . [START ON 10/18/2015] cefUROXime (ZINACEF)  IV  1.5 g Intravenous To OR  . [START ON 10/18/2015] cefUROXime (ZINACEF)  IV  750 mg Intravenous To OR  . chlorhexidine  60 mL Topical Once   And  . [START ON 10/18/2015] chlorhexidine  60 mL Topical Once  . [START ON 10/18/2015] chlorhexidine  15 mL Mouth/Throat Once  . cholecalciferol  2,000 Units Oral Daily  . [START ON 10/18/2015] dexmedetomidine  0.1-0.7 mcg/kg/hr Intravenous To OR  . [START ON 10/18/2015] diazepam  5 mg Oral Once  . [START ON 10/18/2015] DOPamine  0-10 mcg/kg/min Intravenous To OR  . [START ON 10/18/2015] epinephrine  0-10 mcg/min Intravenous To OR  . ferrous sulfate  325 mg Oral QPM  . ferrous sulfate  650  mg Oral Q breakfast  . gabapentin  300 mg Oral TID  . [START ON 10/18/2015] heparin-papaverine-plasmalyte irrigation   Irrigation To OR  . [START ON 10/18/2015] heparin 30,000 units/NS 1000 mL solution for CELLSAVER   Other To OR  . heparin  5,000 Units Subcutaneous 3 times per day  . insulin aspart  0-15 Units Subcutaneous TID WC  . insulin aspart  0-5 Units Subcutaneous QHS  . insulin  aspart  5 Units Subcutaneous TID WC  . insulin detemir  30 Units Subcutaneous QHS  . [START ON 10/18/2015] insulin (NOVOLIN-R) infusion   Intravenous To OR  . [START ON 10/18/2015] magnesium sulfate  40 mEq Other To OR  . [START ON 10/18/2015] metoprolol tartrate  12.5 mg Oral Once  . metoprolol tartrate  25 mg Oral BID  . [START ON 10/18/2015] nitroGLYCERIN  2-200 mcg/min Intravenous To OR  . pantoprazole  40 mg Oral Daily  . [START ON 10/18/2015] phenylephrine (NEO-SYNEPHRINE) Adult infusion  30-200 mcg/min Intravenous To OR  . pneumococcal 23 valent vaccine  0.5 mL Intramuscular Tomorrow-1000  . [START ON 10/18/2015] potassium chloride  80 mEq Other To OR  . sodium chloride flush  3 mL Intravenous Q12H  . [START ON 10/18/2015] vancomycin  1,250 mg Intravenous To OR   Continuous Infusions:   Principal Problem:   Unstable angina (HCC) Active Problems:   Sepsis (HCC)   CAD (coronary artery disease)   Essential hypertension   HLD (hyperlipidemia)   Healthcare-associated pneumonia   Type 2 diabetes mellitus (HCC)   ASCVD (arteriosclerotic cardiovascular disease)   CKD (chronic kidney disease) stage 2, GFR 60-89 ml/min   Hypomagnesemia   Abnormal chest CT   Type 2 diabetes, HbA1c goal < 7% (HCC)    Time spent: 15 minutes    Ilario Dhaliwal L M.D. [facility] [email or web address], password [facility] 10/17/2015, 4:32 PM  LOS: 8 days

## 2015-10-18 ENCOUNTER — Inpatient Hospital Stay (HOSPITAL_COMMUNITY): Payer: Non-veteran care

## 2015-10-18 ENCOUNTER — Inpatient Hospital Stay (HOSPITAL_COMMUNITY): Payer: Non-veteran care | Admitting: Anesthesiology

## 2015-10-18 ENCOUNTER — Encounter (HOSPITAL_COMMUNITY)
Admission: EM | Disposition: A | Discharge status: Home / Self Care | Payer: Self-pay | Source: Home / Self Care | Attending: Internal Medicine

## 2015-10-18 DIAGNOSIS — I25110 Atherosclerotic heart disease of native coronary artery with unstable angina pectoris: Secondary | ICD-10-CM

## 2015-10-18 HISTORY — PX: TEE WITHOUT CARDIOVERSION: SHX5443

## 2015-10-18 HISTORY — PX: CORONARY ARTERY BYPASS GRAFT: SHX141

## 2015-10-18 LAB — POCT I-STAT 3, ART BLOOD GAS (G3+)
Acid-Base Excess: 1 mmol/L (ref 0.0–2.0)
Acid-Base Excess: 2 mmol/L (ref 0.0–2.0)
Acid-base deficit: 3 mmol/L — ABNORMAL HIGH (ref 0.0–2.0)
BICARBONATE: 25.4 meq/L — AB (ref 20.0–24.0)
Bicarbonate: 25.5 mEq/L — ABNORMAL HIGH (ref 20.0–24.0)
Bicarbonate: 24.7 mEq/L — ABNORMAL HIGH (ref 20.0–24.0)
Bicarbonate: 22.6 mEq/L (ref 20.0–24.0)
O2 SAT: 100 %
O2 SAT: 96 %
O2 Saturation: 97 %
O2 Saturation: 99 %
PCO2 ART: 33.4 mmHg — AB (ref 35.0–45.0)
PCO2 ART: 36.9 mmHg (ref 35.0–45.0)
PCO2 ART: 39.6 mmHg (ref 35.0–45.0)
PH ART: 7.394 (ref 7.350–7.450)
PH ART: 7.364 (ref 7.350–7.450)
PO2 ART: 82 mmHg (ref 80.0–100.0)
Patient temperature: 36.1
Patient temperature: 36.7
Patient temperature: 36.8
TCO2: 27 mmol/L (ref 0–100)
TCO2: 26 mmol/L (ref 0–100)
TCO2: 26 mmol/L (ref 0–100)
TCO2: 24 mmol/L (ref 0–100)
pCO2 arterial: 41.7 mmHg (ref 35.0–45.0)
pH, Arterial: 7.485 — ABNORMAL HIGH (ref 7.350–7.450)
pH, Arterial: 7.433 (ref 7.350–7.450)
pO2, Arterial: 370 mmHg — ABNORMAL HIGH (ref 80.0–100.0)
pO2, Arterial: 114 mmHg — ABNORMAL HIGH (ref 80.0–100.0)
pO2, Arterial: 87 mmHg (ref 80.0–100.0)

## 2015-10-18 LAB — BASIC METABOLIC PANEL
ANION GAP: 12 (ref 5–15)
BUN: 19 mg/dL (ref 6–20)
CALCIUM: 9.3 mg/dL (ref 8.9–10.3)
CO2: 28 mmol/L (ref 22–32)
Chloride: 95 mmol/L — ABNORMAL LOW (ref 101–111)
Creatinine, Ser: 1.46 mg/dL — ABNORMAL HIGH (ref 0.61–1.24)
GFR, EST AFRICAN AMERICAN: 55 mL/min — AB (ref >60)
GFR, EST NON AFRICAN AMERICAN: 48 mL/min — AB (ref >60)
Glucose, Bld: 201 mg/dL — ABNORMAL HIGH (ref 65–99)
POTASSIUM: 4.8 mmol/L (ref 3.5–5.1)
Sodium: 135 mmol/L (ref 135–145)

## 2015-10-18 LAB — BLOOD GAS, ARTERIAL
ACID-BASE EXCESS: 1.2 mmol/L (ref 0.0–2.0)
Bicarbonate: 25.1 mEq/L — ABNORMAL HIGH (ref 20.0–24.0)
DRAWN BY: 225631
FIO2: 0.21
O2 Saturation: 95.9 %
PCO2 ART: 38.3 mmHg (ref 35.0–45.0)
PH ART: 7.432 (ref 7.350–7.450)
PO2 ART: 89.8 mmHg (ref 80.0–100.0)
Patient temperature: 98.6
TCO2: 26.3 mmol/L (ref 0–100)

## 2015-10-18 LAB — CBC
HEMATOCRIT: 32.9 % — AB (ref 39.0–52.0)
HEMATOCRIT: 27.5 % — AB (ref 39.0–52.0)
HEMATOCRIT: 26.9 % — AB (ref 39.0–52.0)
HEMOGLOBIN: 8.9 g/dL — AB (ref 13.0–17.0)
HEMOGLOBIN: 8.6 g/dL — AB (ref 13.0–17.0)
Hemoglobin: 10.3 g/dL — ABNORMAL LOW (ref 13.0–17.0)
MCH: 27 pg (ref 26.0–34.0)
MCH: 27.5 pg (ref 26.0–34.0)
MCH: 27.6 pg (ref 26.0–34.0)
MCHC: 31.3 g/dL (ref 30.0–36.0)
MCHC: 32 g/dL (ref 30.0–36.0)
MCHC: 32.4 g/dL (ref 30.0–36.0)
MCV: 85.1 fL (ref 78.0–100.0)
MCV: 85.9 fL (ref 78.0–100.0)
MCV: 86.1 fL (ref 78.0–100.0)
PLATELETS: 390 10*3/uL (ref 150–400)
Platelets: 216 10*3/uL (ref 150–400)
Platelets: 282 10*3/uL (ref 150–400)
RBC: 3.82 MIL/uL — AB (ref 4.22–5.81)
RBC: 3.23 MIL/uL — ABNORMAL LOW (ref 4.22–5.81)
RBC: 3.13 MIL/uL — ABNORMAL LOW (ref 4.22–5.81)
RDW: 15.4 % (ref 11.5–15.5)
RDW: 15.5 % (ref 11.5–15.5)
RDW: 15.9 % — ABNORMAL HIGH (ref 11.5–15.5)
WBC: 10.4 10*3/uL (ref 4.0–10.5)
WBC: 17.8 10*3/uL — AB (ref 4.0–10.5)
WBC: 19 10*3/uL — AB (ref 4.0–10.5)

## 2015-10-18 LAB — CREATININE, SERUM: Creatinine, Ser: 1.16 mg/dL (ref 0.61–1.24)

## 2015-10-18 LAB — POCT I-STAT, CHEM 8
BUN: 20 mg/dL (ref 6–20)
BUN: 16 mg/dL (ref 6–20)
BUN: 18 mg/dL (ref 6–20)
BUN: 15 mg/dL (ref 6–20)
BUN: 16 mg/dL (ref 6–20)
BUN: 18 mg/dL (ref 6–20)
CALCIUM ION: 1 mmol/L — AB (ref 1.13–1.30)
CALCIUM ION: 1.18 mmol/L (ref 1.13–1.30)
CALCIUM ION: 0.95 mmol/L — AB (ref 1.13–1.30)
CHLORIDE: 98 mmol/L — AB (ref 101–111)
CREATININE: 1 mg/dL (ref 0.61–1.24)
CREATININE: 1 mg/dL (ref 0.61–1.24)
Calcium, Ion: 1.23 mmol/L (ref 1.13–1.30)
Calcium, Ion: 1.02 mmol/L — ABNORMAL LOW (ref 1.13–1.30)
Calcium, Ion: 1.07 mmol/L — ABNORMAL LOW (ref 1.13–1.30)
Chloride: 97 mmol/L — ABNORMAL LOW (ref 101–111)
Chloride: 89 mmol/L — ABNORMAL LOW (ref 101–111)
Chloride: 95 mmol/L — ABNORMAL LOW (ref 101–111)
Chloride: 97 mmol/L — ABNORMAL LOW (ref 101–111)
Chloride: 81 mmol/L — ABNORMAL LOW (ref 101–111)
Creatinine, Ser: 0.8 mg/dL (ref 0.61–1.24)
Creatinine, Ser: 0.9 mg/dL (ref 0.61–1.24)
Creatinine, Ser: 0.9 mg/dL (ref 0.61–1.24)
Creatinine, Ser: 1 mg/dL (ref 0.61–1.24)
GLUCOSE: 139 mg/dL — AB (ref 65–99)
GLUCOSE: 169 mg/dL — AB (ref 65–99)
GLUCOSE: 182 mg/dL — AB (ref 65–99)
GLUCOSE: 136 mg/dL — AB (ref 65–99)
Glucose, Bld: 169 mg/dL — ABNORMAL HIGH (ref 65–99)
Glucose, Bld: 138 mg/dL — ABNORMAL HIGH (ref 65–99)
HCT: 34 % — ABNORMAL LOW (ref 39.0–52.0)
HCT: 23 % — ABNORMAL LOW (ref 39.0–52.0)
HCT: 32 % — ABNORMAL LOW (ref 39.0–52.0)
HCT: 26 % — ABNORMAL LOW (ref 39.0–52.0)
HEMATOCRIT: 20 % — AB (ref 39.0–52.0)
HEMATOCRIT: 30 % — AB (ref 39.0–52.0)
HEMOGLOBIN: 11.6 g/dL — AB (ref 13.0–17.0)
HEMOGLOBIN: 6.8 g/dL — AB (ref 13.0–17.0)
HEMOGLOBIN: 10.2 g/dL — AB (ref 13.0–17.0)
HEMOGLOBIN: 8.8 g/dL — AB (ref 13.0–17.0)
Hemoglobin: 10.9 g/dL — ABNORMAL LOW (ref 13.0–17.0)
Hemoglobin: 7.8 g/dL — ABNORMAL LOW (ref 13.0–17.0)
POTASSIUM: 5 mmol/L (ref 3.5–5.1)
POTASSIUM: 4.6 mmol/L (ref 3.5–5.1)
POTASSIUM: 4.6 mmol/L (ref 3.5–5.1)
Potassium: 4.5 mmol/L (ref 3.5–5.1)
Potassium: 4.6 mmol/L (ref 3.5–5.1)
Potassium: 5.5 mmol/L — ABNORMAL HIGH (ref 3.5–5.1)
SODIUM: 130 mmol/L — AB (ref 135–145)
SODIUM: 132 mmol/L — AB (ref 135–145)
SODIUM: 133 mmol/L — AB (ref 135–145)
Sodium: 135 mmol/L (ref 135–145)
Sodium: 134 mmol/L — ABNORMAL LOW (ref 135–145)
Sodium: 133 mmol/L — ABNORMAL LOW (ref 135–145)
TCO2: 33 mmol/L (ref 0–100)
TCO2: 26 mmol/L (ref 0–100)
TCO2: 29 mmol/L (ref 0–100)
TCO2: 25 mmol/L (ref 0–100)
TCO2: 24 mmol/L (ref 0–100)
TCO2: 24 mmol/L (ref 0–100)

## 2015-10-18 LAB — PREPARE RBC (CROSSMATCH)

## 2015-10-18 LAB — POCT I-STAT 4, (NA,K, GLUC, HGB,HCT)
Glucose, Bld: 139 mg/dL — ABNORMAL HIGH (ref 65–99)
HCT: 30 % — ABNORMAL LOW (ref 39.0–52.0)
HEMOGLOBIN: 10.2 g/dL — AB (ref 13.0–17.0)
POTASSIUM: 4.4 mmol/L (ref 3.5–5.1)
SODIUM: 136 mmol/L (ref 135–145)

## 2015-10-18 LAB — GLUCOSE, CAPILLARY
GLUCOSE-CAPILLARY: 158 mg/dL — AB (ref 65–99)
GLUCOSE-CAPILLARY: 145 mg/dL — AB (ref 65–99)
GLUCOSE-CAPILLARY: 152 mg/dL — AB (ref 65–99)
Glucose-Capillary: 117 mg/dL — ABNORMAL HIGH (ref 65–99)
Glucose-Capillary: 104 mg/dL — ABNORMAL HIGH (ref 65–99)
Glucose-Capillary: 100 mg/dL — ABNORMAL HIGH (ref 65–99)
Glucose-Capillary: 106 mg/dL — ABNORMAL HIGH (ref 65–99)
Glucose-Capillary: 130 mg/dL — ABNORMAL HIGH (ref 65–99)
Glucose-Capillary: 120 mg/dL — ABNORMAL HIGH (ref 65–99)

## 2015-10-18 LAB — MAGNESIUM: MAGNESIUM: 3.4 mg/dL — AB (ref 1.7–2.4)

## 2015-10-18 LAB — APTT: APTT: 31 s (ref 24–37)

## 2015-10-18 LAB — HEMOGLOBIN AND HEMATOCRIT, BLOOD
HEMATOCRIT: 20.2 % — AB (ref 39.0–52.0)
HEMOGLOBIN: 6.6 g/dL — AB (ref 13.0–17.0)

## 2015-10-18 LAB — PROTIME-INR
INR: 1.59 — AB (ref 0.00–1.49)
PROTHROMBIN TIME: 19 s — AB (ref 11.6–15.2)

## 2015-10-18 LAB — PLATELET COUNT: PLATELETS: 250 10*3/uL (ref 150–400)

## 2015-10-18 SURGERY — CORONARY ARTERY BYPASS GRAFTING (CABG)
Anesthesia: General | Site: Chest

## 2015-10-18 MED ORDER — ACETAMINOPHEN 500 MG PO TABS
1000.0000 mg | ORAL_TABLET | Freq: Four times a day (QID) | ORAL | Status: DC
  Administered 2015-10-19 – 2015-10-22 (×11): 1000 mg via ORAL
  Filled 2015-10-18 (×11): qty 2

## 2015-10-18 MED ORDER — MIDAZOLAM HCL 10 MG/2ML IJ SOLN
INTRAMUSCULAR | Status: AC
  Filled 2015-10-18: qty 2

## 2015-10-18 MED ORDER — SODIUM CHLORIDE 0.9% FLUSH
3.0000 mL | Freq: Two times a day (BID) | INTRAVENOUS | Status: DC
  Administered 2015-10-19: 3 mL via INTRAVENOUS

## 2015-10-18 MED ORDER — BISACODYL 5 MG PO TBEC
10.0000 mg | DELAYED_RELEASE_TABLET | Freq: Every day | ORAL | Status: DC
  Administered 2015-10-19 – 2015-10-22 (×4): 10 mg via ORAL
  Filled 2015-10-18 (×4): qty 2

## 2015-10-18 MED ORDER — TRAMADOL HCL 50 MG PO TABS
50.0000 mg | ORAL_TABLET | ORAL | Status: DC | PRN
  Administered 2015-10-20 – 2015-10-21 (×2): 100 mg via ORAL
  Filled 2015-10-18 (×2): qty 2

## 2015-10-18 MED ORDER — PANTOPRAZOLE SODIUM 40 MG PO TBEC
40.0000 mg | DELAYED_RELEASE_TABLET | Freq: Every day | ORAL | Status: DC
  Administered 2015-10-20: 40 mg via ORAL
  Filled 2015-10-18: qty 1

## 2015-10-18 MED ORDER — ROCURONIUM BROMIDE 50 MG/5ML IV SOLN
INTRAVENOUS | Status: AC
  Filled 2015-10-18: qty 1

## 2015-10-18 MED ORDER — SODIUM CHLORIDE 0.9 % IJ SOLN
INTRAMUSCULAR | Status: AC
  Filled 2015-10-18: qty 20

## 2015-10-18 MED ORDER — VECURONIUM BROMIDE 10 MG IV SOLR
INTRAVENOUS | Status: DC | PRN
  Administered 2015-10-18: 3 mg via INTRAVENOUS
  Administered 2015-10-18 (×3): 2 mg via INTRAVENOUS

## 2015-10-18 MED ORDER — STERILE WATER FOR INJECTION IJ SOLN
INTRAMUSCULAR | Status: AC
  Filled 2015-10-18: qty 10

## 2015-10-18 MED ORDER — HEPARIN SODIUM (PORCINE) 1000 UNIT/ML IJ SOLN
INTRAMUSCULAR | Status: AC
  Filled 2015-10-18: qty 2

## 2015-10-18 MED ORDER — DEXMEDETOMIDINE HCL IN NACL 200 MCG/50ML IV SOLN: 0.0000 ug/kg/h | INTRAVENOUS | Status: DC

## 2015-10-18 MED ORDER — CETYLPYRIDINIUM CHLORIDE 0.05 % MT LIQD
7.0000 mL | Freq: Two times a day (BID) | OROMUCOSAL | Status: DC
  Administered 2015-10-18 – 2015-10-20 (×4): 7 mL via OROMUCOSAL

## 2015-10-18 MED ORDER — ALBUMIN HUMAN 5 % IV SOLN
250.0000 mL | INTRAVENOUS | Status: AC | PRN
  Administered 2015-10-18 (×2): 250 mL via INTRAVENOUS

## 2015-10-18 MED ORDER — CHLORHEXIDINE GLUCONATE 0.12 % MT SOLN
15.0000 mL | OROMUCOSAL | Status: AC
  Administered 2015-10-18: 15 mL via OROMUCOSAL

## 2015-10-18 MED ORDER — LACTATED RINGERS IV SOLN: INTRAVENOUS | Status: DC

## 2015-10-18 MED ORDER — METOPROLOL TARTRATE 12.5 MG HALF TABLET
12.5000 mg | ORAL_TABLET | Freq: Two times a day (BID) | ORAL | Status: DC
Start: 2015-10-18 — End: 2015-10-21
  Administered 2015-10-19 – 2015-10-20 (×4): 12.5 mg via ORAL
  Filled 2015-10-18 (×5): qty 1

## 2015-10-18 MED ORDER — ARTIFICIAL TEARS OP OINT
TOPICAL_OINTMENT | OPHTHALMIC | Status: DC | PRN
  Administered 2015-10-18: 1 via OPHTHALMIC

## 2015-10-18 MED ORDER — SODIUM CHLORIDE 0.9% FLUSH: 3.0000 mL | INTRAVENOUS | Status: DC | PRN

## 2015-10-18 MED ORDER — LACTATED RINGERS IV SOLN
INTRAVENOUS | Status: DC | PRN
  Administered 2015-10-18: 07:00:00 via INTRAVENOUS

## 2015-10-18 MED ORDER — CHLORHEXIDINE GLUCONATE 0.12% ORAL RINSE (MEDLINE KIT): 15.0000 mL | Freq: Two times a day (BID) | OROMUCOSAL | Status: DC

## 2015-10-18 MED ORDER — LIDOCAINE HCL (CARDIAC) 20 MG/ML IV SOLN
INTRAVENOUS | Status: DC | PRN
  Administered 2015-10-18: 80 mg via INTRAVENOUS

## 2015-10-18 MED ORDER — SODIUM CHLORIDE 0.45 % IV SOLN: INTRAVENOUS | Status: DC | PRN

## 2015-10-18 MED ORDER — OXYCODONE HCL 5 MG PO TABS
5.0000 mg | ORAL_TABLET | ORAL | Status: DC | PRN
  Administered 2015-10-18 – 2015-10-21 (×6): 10 mg via ORAL
  Filled 2015-10-18 (×6): qty 2

## 2015-10-18 MED ORDER — PROPOFOL 10 MG/ML IV BOLUS
INTRAVENOUS | Status: DC | PRN
  Administered 2015-10-18: 130 mg via INTRAVENOUS

## 2015-10-18 MED ORDER — FERROUS SULFATE 325 (65 FE) MG PO TABS
650.0000 mg | ORAL_TABLET | Freq: Every day | ORAL | Status: DC
  Administered 2015-10-19 – 2015-10-22 (×4): 650 mg via ORAL
  Filled 2015-10-18 (×5): qty 2

## 2015-10-18 MED ORDER — ALBUMIN HUMAN 5 % IV SOLN
INTRAVENOUS | Status: DC | PRN
  Administered 2015-10-18: 12:00:00 via INTRAVENOUS

## 2015-10-18 MED ORDER — MAGNESIUM SULFATE 4 GM/100ML IV SOLN
4.0000 g | Freq: Once | INTRAVENOUS | Status: AC
  Administered 2015-10-18 (×2): 4 g via INTRAVENOUS
  Filled 2015-10-18: qty 100

## 2015-10-18 MED ORDER — PHENYLEPHRINE HCL 10 MG/ML IJ SOLN
0.0000 ug/min | INTRAVENOUS | Status: DC
  Filled 2015-10-18: qty 2

## 2015-10-18 MED ORDER — ASPIRIN EC 325 MG PO TBEC
325.0000 mg | DELAYED_RELEASE_TABLET | Freq: Every day | ORAL | Status: DC
Start: 2015-10-19 — End: 2015-10-20
  Administered 2015-10-19: 325 mg via ORAL
  Filled 2015-10-18: qty 1

## 2015-10-18 MED ORDER — FERROUS SULFATE 325 (65 FE) MG PO TABS
325.0000 mg | ORAL_TABLET | Freq: Every evening | ORAL | Status: DC
  Administered 2015-10-19 – 2015-10-21 (×3): 325 mg via ORAL
  Filled 2015-10-18 (×3): qty 1

## 2015-10-18 MED ORDER — ROCURONIUM BROMIDE 100 MG/10ML IV SOLN
INTRAVENOUS | Status: DC | PRN
  Administered 2015-10-18: 50 mg via INTRAVENOUS

## 2015-10-18 MED ORDER — ARTIFICIAL TEARS OP OINT
TOPICAL_OINTMENT | OPHTHALMIC | Status: AC
  Filled 2015-10-18: qty 3.5

## 2015-10-18 MED ORDER — LACTATED RINGERS IV SOLN: 500.0000 mL | Freq: Once | INTRAVENOUS | Status: DC | PRN

## 2015-10-18 MED ORDER — HEPARIN SODIUM (PORCINE) 1000 UNIT/ML IJ SOLN
INTRAMUSCULAR | Status: AC
  Filled 2015-10-18: qty 1

## 2015-10-18 MED ORDER — BISACODYL 10 MG RE SUPP: 10.0000 mg | Freq: Every day | RECTAL | Status: DC

## 2015-10-18 MED ORDER — NITROGLYCERIN IN D5W 200-5 MCG/ML-% IV SOLN: 0.0000 ug/min | INTRAVENOUS | Status: DC

## 2015-10-18 MED ORDER — ACETAMINOPHEN 160 MG/5ML PO SOLN: 650.0000 mg | Freq: Once | ORAL | Status: AC

## 2015-10-18 MED ORDER — ASPIRIN 81 MG PO CHEW: 324.0000 mg | CHEWABLE_TABLET | Freq: Every day | ORAL | Status: DC

## 2015-10-18 MED ORDER — EPHEDRINE SULFATE 50 MG/ML IJ SOLN
INTRAMUSCULAR | Status: AC
  Filled 2015-10-18: qty 1

## 2015-10-18 MED ORDER — LIDOCAINE HCL (CARDIAC) 20 MG/ML IV SOLN
INTRAVENOUS | Status: AC
  Filled 2015-10-18: qty 5

## 2015-10-18 MED ORDER — HEPARIN SODIUM (PORCINE) 1000 UNIT/ML IJ SOLN
INTRAMUSCULAR | Status: DC | PRN
  Administered 2015-10-18: 48000 [IU] via INTRAVENOUS
  Administered 2015-10-18: 2000 [IU] via INTRAVENOUS

## 2015-10-18 MED ORDER — SUCCINYLCHOLINE CHLORIDE 20 MG/ML IJ SOLN
INTRAMUSCULAR | Status: AC
  Filled 2015-10-18: qty 1

## 2015-10-18 MED ORDER — INSULIN REGULAR BOLUS VIA INFUSION
0.0000 [IU] | Freq: Three times a day (TID) | INTRAVENOUS | Status: DC
  Filled 2015-10-18: qty 10

## 2015-10-18 MED ORDER — MORPHINE SULFATE (PF) 2 MG/ML IV SOLN
2.0000 mg | INTRAVENOUS | Status: DC | PRN
  Administered 2015-10-18 – 2015-10-19 (×3): 2 mg via INTRAVENOUS
  Filled 2015-10-18 (×4): qty 1

## 2015-10-18 MED ORDER — PROTAMINE SULFATE 10 MG/ML IV SOLN
INTRAVENOUS | Status: DC | PRN
  Administered 2015-10-18 (×4): 50 mg via INTRAVENOUS
  Administered 2015-10-18 (×2): 25 mg via INTRAVENOUS
  Administered 2015-10-18: 50 mg via INTRAVENOUS
  Administered 2015-10-18 (×2): 25 mg via INTRAVENOUS

## 2015-10-18 MED ORDER — MIDAZOLAM HCL 5 MG/5ML IJ SOLN
INTRAMUSCULAR | Status: DC | PRN
  Administered 2015-10-18: 1 mg via INTRAVENOUS
  Administered 2015-10-18: 2 mg via INTRAVENOUS
  Administered 2015-10-18 (×4): 1 mg via INTRAVENOUS
  Administered 2015-10-18: 2 mg via INTRAVENOUS
  Administered 2015-10-18: 1 mg via INTRAVENOUS

## 2015-10-18 MED ORDER — METOPROLOL TARTRATE 1 MG/ML IV SOLN: 2.5000 mg | INTRAVENOUS | Status: DC | PRN

## 2015-10-18 MED ORDER — DOCUSATE SODIUM 100 MG PO CAPS
200.0000 mg | ORAL_CAPSULE | Freq: Every day | ORAL | Status: DC
  Administered 2015-10-19 – 2015-10-22 (×4): 200 mg via ORAL
  Filled 2015-10-18 (×4): qty 2

## 2015-10-18 MED ORDER — GELATIN ABSORBABLE MT POWD
OROMUCOSAL | Status: DC | PRN
  Administered 2015-10-18 (×3): 4 mL via TOPICAL

## 2015-10-18 MED ORDER — ONDANSETRON HCL 4 MG/2ML IJ SOLN: 4.0000 mg | Freq: Four times a day (QID) | INTRAMUSCULAR | Status: DC | PRN

## 2015-10-18 MED ORDER — SODIUM CHLORIDE 0.9 % IV SOLN
INTRAVENOUS | Status: DC
  Administered 2015-10-18: 2.4 [IU]/h via INTRAVENOUS
  Filled 2015-10-18 (×2): qty 2.5

## 2015-10-18 MED ORDER — EPHEDRINE SULFATE 50 MG/ML IJ SOLN
INTRAMUSCULAR | Status: DC | PRN
  Administered 2015-10-18 (×2): 5 mg via INTRAVENOUS

## 2015-10-18 MED ORDER — METOPROLOL TARTRATE 25 MG/10 ML ORAL SUSPENSION: 12.5000 mg | Freq: Two times a day (BID) | ORAL | Status: DC

## 2015-10-18 MED ORDER — VECURONIUM BROMIDE 10 MG IV SOLR
INTRAVENOUS | Status: AC
  Filled 2015-10-18: qty 10

## 2015-10-18 MED ORDER — HEMOSTATIC AGENTS (NO CHARGE) OPTIME
TOPICAL | Status: DC | PRN
  Administered 2015-10-18: 1 via TOPICAL

## 2015-10-18 MED ORDER — FENTANYL CITRATE (PF) 250 MCG/5ML IJ SOLN
INTRAMUSCULAR | Status: AC
  Filled 2015-10-18: qty 5

## 2015-10-18 MED ORDER — FAMOTIDINE IN NACL 20-0.9 MG/50ML-% IV SOLN
20.0000 mg | Freq: Two times a day (BID) | INTRAVENOUS | Status: AC
  Administered 2015-10-18: 20 mg via INTRAVENOUS

## 2015-10-18 MED ORDER — FENTANYL CITRATE (PF) 100 MCG/2ML IJ SOLN
INTRAMUSCULAR | Status: DC | PRN
  Administered 2015-10-18: 100 ug via INTRAVENOUS
  Administered 2015-10-18: 50 ug via INTRAVENOUS
  Administered 2015-10-18: 100 ug via INTRAVENOUS
  Administered 2015-10-18: 50 ug via INTRAVENOUS
  Administered 2015-10-18: 200 ug via INTRAVENOUS
  Administered 2015-10-18 (×3): 50 ug via INTRAVENOUS
  Administered 2015-10-18: 100 ug via INTRAVENOUS
  Administered 2015-10-18: 250 ug via INTRAVENOUS
  Administered 2015-10-18: 50 ug via INTRAVENOUS
  Administered 2015-10-18 (×2): 100 ug via INTRAVENOUS

## 2015-10-18 MED ORDER — ACETAMINOPHEN 160 MG/5ML PO SOLN: 1000.0000 mg | Freq: Four times a day (QID) | ORAL | Status: DC

## 2015-10-18 MED ORDER — CEFUROXIME SODIUM 1.5 G IJ SOLR
1.5000 g | Freq: Two times a day (BID) | INTRAMUSCULAR | Status: AC
  Administered 2015-10-18 – 2015-10-20 (×4): 1.5 g via INTRAVENOUS
  Filled 2015-10-18 (×4): qty 1.5

## 2015-10-18 MED ORDER — PROPOFOL 10 MG/ML IV BOLUS
INTRAVENOUS | Status: AC
  Filled 2015-10-18: qty 20

## 2015-10-18 MED ORDER — VITAMIN D 1000 UNITS PO TABS
2000.0000 [IU] | ORAL_TABLET | Freq: Every day | ORAL | Status: DC
  Administered 2015-10-20 – 2015-10-22 (×3): 2000 [IU] via ORAL
  Filled 2015-10-18 (×5): qty 2

## 2015-10-18 MED ORDER — POTASSIUM CHLORIDE 10 MEQ/50ML IV SOLN: 10.0000 meq | INTRAVENOUS | Status: AC

## 2015-10-18 MED ORDER — ANTISEPTIC ORAL RINSE SOLUTION (CORINZ): 7.0000 mL | Freq: Four times a day (QID) | OROMUCOSAL | Status: DC

## 2015-10-18 MED ORDER — PROTAMINE SULFATE 10 MG/ML IV SOLN
INTRAVENOUS | Status: AC
  Filled 2015-10-18: qty 25

## 2015-10-18 MED ORDER — 0.9 % SODIUM CHLORIDE (POUR BTL) OPTIME
TOPICAL | Status: DC | PRN
  Administered 2015-10-18: 6000 mL

## 2015-10-18 MED ORDER — MIDAZOLAM HCL 2 MG/2ML IJ SOLN: 2.0000 mg | INTRAMUSCULAR | Status: DC | PRN

## 2015-10-18 MED ORDER — ACETAMINOPHEN 650 MG RE SUPP
650.0000 mg | Freq: Once | RECTAL | Status: AC
  Administered 2015-10-18: 650 mg via RECTAL

## 2015-10-18 MED ORDER — SODIUM CHLORIDE 0.9 % IV SOLN: 250.0000 mL | INTRAVENOUS | Status: DC

## 2015-10-18 MED ORDER — VANCOMYCIN HCL IN DEXTROSE 1-5 GM/200ML-% IV SOLN
1000.0000 mg | Freq: Once | INTRAVENOUS | Status: AC
  Administered 2015-10-18: 1000 mg via INTRAVENOUS
  Filled 2015-10-18: qty 200

## 2015-10-18 MED ORDER — CETYLPYRIDINIUM CHLORIDE 0.05 % MT LIQD: 7.0000 mL | Freq: Two times a day (BID) | OROMUCOSAL | Status: DC

## 2015-10-18 MED ORDER — MORPHINE SULFATE (PF) 2 MG/ML IV SOLN
1.0000 mg | INTRAVENOUS | Status: AC | PRN
  Administered 2015-10-18 (×2): 2 mg via INTRAVENOUS
  Filled 2015-10-18: qty 1

## 2015-10-18 MED ORDER — SODIUM CHLORIDE 0.9 % IV SOLN
INTRAVENOUS | Status: DC
Start: 2015-10-18 — End: 2015-10-20
  Administered 2015-10-18: 15:00:00 via INTRAVENOUS

## 2015-10-18 MED ORDER — FENTANYL CITRATE (PF) 250 MCG/5ML IJ SOLN
INTRAMUSCULAR | Status: AC
  Filled 2015-10-18: qty 20

## 2015-10-18 MED FILL — Magnesium Sulfate Inj 50%: INTRAMUSCULAR | Qty: 10 | Status: AC

## 2015-10-18 MED FILL — Potassium Chloride Inj 2 mEq/ML: INTRAVENOUS | Qty: 40 | Status: AC

## 2015-10-18 MED FILL — Heparin Sodium (Porcine) Inj 1000 Unit/ML: INTRAMUSCULAR | Qty: 30 | Status: AC

## 2015-10-18 SURGICAL SUPPLY — 95 items
ADH SKN CLS APL DERMABOND .7 (GAUZE/BANDAGES/DRESSINGS) ×2
BAG DECANTER FOR FLEXI CONT (MISCELLANEOUS) ×4 IMPLANT
BANDAGE ACE 4X5 VEL STRL LF (GAUZE/BANDAGES/DRESSINGS) ×2 IMPLANT
BANDAGE ACE 6X5 VEL STRL LF (GAUZE/BANDAGES/DRESSINGS) ×2 IMPLANT
BANDAGE ELASTIC 4 VELCRO ST LF (GAUZE/BANDAGES/DRESSINGS) ×4 IMPLANT
BANDAGE ELASTIC 6 VELCRO ST LF (GAUZE/BANDAGES/DRESSINGS) ×4 IMPLANT
BASKET HEART  (ORDER IN 25'S) (MISCELLANEOUS) ×1
BASKET HEART (ORDER IN 25'S) (MISCELLANEOUS) ×1
BASKET HEART (ORDER IN 25S) (MISCELLANEOUS) ×2 IMPLANT
BLADE STERNUM SYSTEM 6 (BLADE) ×4 IMPLANT
BLADE SURG 11 STRL SS (BLADE) ×2 IMPLANT
BNDG GAUZE ELAST 4 BULKY (GAUZE/BANDAGES/DRESSINGS) ×4 IMPLANT
CANISTER SUCTION 2500CC (MISCELLANEOUS) ×4 IMPLANT
CANNULA EZ GLIDE AORTIC 21FR (CANNULA) ×4 IMPLANT
CATH CPB KIT HENDRICKSON (MISCELLANEOUS) ×4 IMPLANT
CATH ROBINSON RED A/P 18FR (CATHETERS) ×4 IMPLANT
CATH THORACIC 36FR (CATHETERS) ×4 IMPLANT
CATH THORACIC 36FR RT ANG (CATHETERS) ×4 IMPLANT
CLIP FOGARTY SPRING 6M (CLIP) ×2 IMPLANT
CLIP TI MEDIUM 24 (CLIP) IMPLANT
CLIP TI WIDE RED SMALL 24 (CLIP) ×4 IMPLANT
CRADLE DONUT ADULT HEAD (MISCELLANEOUS) ×4 IMPLANT
DERMABOND ADVANCED (GAUZE/BANDAGES/DRESSINGS) ×2
DERMABOND ADVANCED .7 DNX12 (GAUZE/BANDAGES/DRESSINGS) IMPLANT
DRAPE CARDIOVASCULAR INCISE (DRAPES) ×4
DRAPE SLUSH/WARMER DISC (DRAPES) ×4 IMPLANT
DRAPE SRG 135X102X78XABS (DRAPES) ×2 IMPLANT
DRSG COVADERM 4X14 (GAUZE/BANDAGES/DRESSINGS) ×4 IMPLANT
ELECT REM PT RETURN 9FT ADLT (ELECTROSURGICAL) ×8
ELECTRODE REM PT RTRN 9FT ADLT (ELECTROSURGICAL) ×4 IMPLANT
FELT TEFLON 1X6 (MISCELLANEOUS) ×4 IMPLANT
GAUZE SPONGE 4X4 12PLY STRL (GAUZE/BANDAGES/DRESSINGS) ×8 IMPLANT
GLOVE BIO SURGEON STRL SZ 6 (GLOVE) ×2 IMPLANT
GLOVE BIO SURGEON STRL SZ7 (GLOVE) ×8 IMPLANT
GLOVE BIOGEL PI IND STRL 7.0 (GLOVE) IMPLANT
GLOVE BIOGEL PI INDICATOR 7.0 (GLOVE) ×4
GLOVE SURG SIGNA 7.5 PF LTX (GLOVE) ×12 IMPLANT
GOWN STRL REUS W/ TWL LRG LVL3 (GOWN DISPOSABLE) ×8 IMPLANT
GOWN STRL REUS W/ TWL XL LVL3 (GOWN DISPOSABLE) ×4 IMPLANT
GOWN STRL REUS W/TWL LRG LVL3 (GOWN DISPOSABLE) ×16
GOWN STRL REUS W/TWL XL LVL3 (GOWN DISPOSABLE) ×8
HEMOSTAT POWDER SURGIFOAM 1G (HEMOSTASIS) ×12 IMPLANT
HEMOSTAT SURGICEL 2X14 (HEMOSTASIS) ×4 IMPLANT
INSERT FOGARTY XLG (MISCELLANEOUS) IMPLANT
KIT BASIN OR (CUSTOM PROCEDURE TRAY) ×4 IMPLANT
KIT ROOM TURNOVER OR (KITS) ×4 IMPLANT
KIT SUCTION CATH 14FR (SUCTIONS) ×8 IMPLANT
KIT VASOVIEW W/TROCAR VH 2000 (KITS) ×4 IMPLANT
MARKER GRAFT CORONARY BYPASS (MISCELLANEOUS) ×12 IMPLANT
NS IRRIG 1000ML POUR BTL (IV SOLUTION) ×24 IMPLANT
PACK OPEN HEART (CUSTOM PROCEDURE TRAY) ×4 IMPLANT
PAD ARMBOARD 7.5X6 YLW CONV (MISCELLANEOUS) ×8 IMPLANT
PAD ELECT DEFIB RADIOL ZOLL (MISCELLANEOUS) ×4 IMPLANT
PENCIL BUTTON HOLSTER BLD 10FT (ELECTRODE) ×6 IMPLANT
PUNCH AORTIC ROTATE 4.0MM (MISCELLANEOUS) ×2 IMPLANT
PUNCH AORTIC ROTATE 4.5MM 8IN (MISCELLANEOUS) IMPLANT
PUNCH AORTIC ROTATE 5MM 8IN (MISCELLANEOUS) IMPLANT
SET CARDIOPLEGIA MPS 5001102 (MISCELLANEOUS) ×2 IMPLANT
SPONGE GAUZE 4X4 12PLY STER LF (GAUZE/BANDAGES/DRESSINGS) ×4 IMPLANT
SUT BONE WAX W31G (SUTURE) ×4 IMPLANT
SUT MNCRL AB 4-0 PS2 18 (SUTURE) IMPLANT
SUT PROLENE 3 0 SH DA (SUTURE) ×4 IMPLANT
SUT PROLENE 4 0 RB 1 (SUTURE)
SUT PROLENE 4 0 SH DA (SUTURE) IMPLANT
SUT PROLENE 4-0 RB1 .5 CRCL 36 (SUTURE) IMPLANT
SUT PROLENE 6 0 C 1 30 (SUTURE) ×12 IMPLANT
SUT PROLENE 7 0 BV 1 (SUTURE) ×4 IMPLANT
SUT PROLENE 7 0 BV1 MDA (SUTURE) ×6 IMPLANT
SUT PROLENE 8 0 BV175 6 (SUTURE) IMPLANT
SUT SILK  1 MH (SUTURE) ×2
SUT SILK 1 MH (SUTURE) IMPLANT
SUT STEEL 6MS V (SUTURE) ×4 IMPLANT
SUT STEEL STERNAL CCS#1 18IN (SUTURE) IMPLANT
SUT STEEL SZ 6 DBL 3X14 BALL (SUTURE) ×4 IMPLANT
SUT VIC AB 1 CTX 36 (SUTURE) ×8
SUT VIC AB 1 CTX36XBRD ANBCTR (SUTURE) ×4 IMPLANT
SUT VIC AB 2-0 CT1 27 (SUTURE) ×8
SUT VIC AB 2-0 CT1 TAPERPNT 27 (SUTURE) IMPLANT
SUT VIC AB 2-0 CTX 27 (SUTURE) ×2 IMPLANT
SUT VIC AB 3-0 SH 27 (SUTURE)
SUT VIC AB 3-0 SH 27X BRD (SUTURE) IMPLANT
SUT VIC AB 3-0 X1 27 (SUTURE) IMPLANT
SUT VICRYL 4-0 PS2 18IN ABS (SUTURE) ×2 IMPLANT
SUTURE E-PAK OPEN HEART (SUTURE) ×4 IMPLANT
SYSTEM SAHARA CHEST DRAIN ATS (WOUND CARE) ×4 IMPLANT
TAPE CLOTH SURG 4X10 WHT LF (GAUZE/BANDAGES/DRESSINGS) ×2 IMPLANT
TAPE PAPER 2X10 WHT MICROPORE (GAUZE/BANDAGES/DRESSINGS) ×2 IMPLANT
TOWEL OR 17X24 6PK STRL BLUE (TOWEL DISPOSABLE) ×8 IMPLANT
TOWEL OR 17X26 10 PK STRL BLUE (TOWEL DISPOSABLE) ×8 IMPLANT
TRAY FOLEY IC TEMP SENS 16FR (CATHETERS) ×4 IMPLANT
TUBE FEEDING 8FR 16IN STR KANG (MISCELLANEOUS) ×4 IMPLANT
TUBE SUCT INTRACARD DLP 20F (MISCELLANEOUS) ×2 IMPLANT
TUBING INSUFFLATION (TUBING) ×4 IMPLANT
UNDERPAD 30X30 INCONTINENT (UNDERPADS AND DIAPERS) ×4 IMPLANT
WATER STERILE IRR 1000ML POUR (IV SOLUTION) ×8 IMPLANT

## 2015-10-18 NOTE — Progress Notes (Signed)
Call received from Dr. Nyoka Cowden at 2150 regarding patient's ekg from 1316,  He states that it shows new ST elevation and requests that I call attending MD.  Dr. Servando Snare paged and made aware, no new orders received.

## 2015-10-18 NOTE — Progress Notes (Signed)
Patient ID: [name], male   DOB: Nov 24, 1945, 70 y.o.   MRN: [id number] EVENING ROUNDS NOTE :     [address]       [place] [address]             [phone]                 Day of Surgery Procedure(s) (LRB): CORONARY ARTERY BYPASS GRAFTING (CABG) x  five, using left internal mammary artery and right leg greater saphenous vein harvested endoscopically (N/A) TRANSESOPHAGEAL ECHOCARDIOGRAM (TEE) (N/A)  Total Length of Stay:  LOS: 9 days  BP 97/83 mmHg  Pulse 80  Temp(Src) 97.7 F (36.5 C) (Core (Comment))  Resp 21  Ht 5\' 7"  (1.702 m)  Wt 150 lb 6.4 oz (68.221 kg)  BMI 23.55 kg/m2  SpO2 100%  .Intake/Output      03/06 0701 - 03/07 0700   P.O.    I.V. (mL/kg) 2386.3 (35)   Blood 400   NG/GT 30   IV Piggyback 1200   Total Intake(mL/kg) 4016.3 (58.9)   Urine (mL/kg/hr) 1990 (2.1)   Emesis/NG output 0 (0)   Stool    Blood 1900 (2)   Chest Tube 150 (0.2)   Total Output 4040   Net -23.7         . sodium chloride    . [START ON 10/19/2015] sodium chloride    . sodium chloride 20 mL/hr at 10/18/15 1441  . dexmedetomidine Stopped (10/18/15 1540)  . insulin (NOVOLIN-R) infusion 3.9 Units/hr (10/18/15 2100)  . lactated ringers    . lactated ringers 20 mL/hr at 10/18/15 1400  . nitroGLYCERIN Stopped (10/18/15 1300)  . phenylephrine (NEO-SYNEPHRINE) Adult infusion 10 mcg/min (10/18/15 1900)     Lab Results  Component Value Date   WBC 19.0* 10/18/2015   HGB 8.8* 10/18/2015   HCT 26.0* 10/18/2015   PLT 282 10/18/2015   GLUCOSE 136* 10/18/2015   CHOL 110 10/11/2015   TRIG 135 10/11/2015   HDL 27* 10/11/2015   LDLCALC 56 10/11/2015   ALT 11* 10/11/2015   AST 14* 10/11/2015   NA 133* 10/18/2015   K 4.6 10/18/2015   CL 81* 10/18/2015   CREATININE 1.00 10/18/2015   BUN 18 10/18/2015   CO2 28 10/17/2015   TSH 0.967 07/09/2015   INR 1.59* 10/18/2015   HGBA1C 7.9* 10/09/2015   Extubated, awake alert  Not bleeding   Grace Isaac  MD  Beeper [phone] Office [phone] 10/18/2015 9:10 PM

## 2015-10-18 NOTE — Progress Notes (Signed)
Patient in ICU on TCTS service.  Thanks for assistance.  Call [place] if needed.  Doree Barthel, MD [facility] [email or web address] password [facility]

## 2015-10-18 NOTE — Brief Op Note (Addendum)
10/09/2015 - 10/18/2015  11:13 AM  PATIENT:  [name]  70 y.o. male  PRE-OPERATIVE DIAGNOSIS:  3 VESSEL CAD with UNSTABLE ANGINA  POST-OPERATIVE DIAGNOSIS:  3 VESSEL CAD with UNSTABLE ANGINA  PROCEDURE: TRANSESOPHAGEAL ECHOCARDIOGRAM (TEE),   MEDIAN STERNOTOMY for  CORONARY ARTERY BYPASS GRAFTING (CABG) x 5  LIMA to LAD  SVG to DIAGONAL  SEQUENTIAL SVG to OM1 and OM2  SVG to DISTAL RCA ENDOSCOPIC VEIN HARVEST RIGHT LEG  SURGEON:  Surgeon(s) and Role:    * [name], MD - Primary  PHYSICIAN ASSISTANT: Lars Pinks PA-C  ANESTHESIA:   general  EBL:  Total I/O In: 1100 [I.V.:1100] Out: 680 [Urine:680]  DRAINS: Chest tubes placed in the mediastinal and pleural spaces   COUNTSCORRECT :  YES  PLAN OF CARE: Admit to inpatient   PATIENT DISPOSITION:  ICU - intubated and hemodynamically stable.   Delay start of Pharmacological VTE agent (>24hrs) due to surgical blood loss or risk of bleeding: yes  BASELINE WEIGHT: 68 kg  Vein and Mammary good quality. Good targets.

## 2015-10-18 NOTE — Progress Notes (Signed)
  Echocardiogram Echocardiogram Transesophageal has been performed.  Bobbye Charleston 10/18/2015, 8:48 AM

## 2015-10-18 NOTE — OR Nursing (Signed)
11:50 - 40 minute call to SICU

## 2015-10-18 NOTE — OR Nursing (Signed)
12:25 - 20 minute call to SICU charge nurse

## 2015-10-18 NOTE — Anesthesia Postprocedure Evaluation (Signed)
Anesthesia Post Note  Patient: [name]  Procedure(s) Performed: Procedure(s) (LRB): CORONARY ARTERY BYPASS GRAFTING (CABG) x  five, using left internal mammary artery and right leg greater saphenous vein harvested endoscopically (N/A) TRANSESOPHAGEAL ECHOCARDIOGRAM (TEE) (N/A)  Patient location during evaluation: ICU Anesthesia Type: General Level of consciousness: patient remains intubated per anesthesia plan Vital Signs Assessment: post-procedure vital signs reviewed and stable Respiratory status: patient remains intubated per anesthesia plan Cardiovascular status: stable Anesthetic complications: no    Last Vitals:  Filed Vitals:   10/18/15 1430 10/18/15 1445  BP:    Pulse: 80 80  Temp: 36.4 C 36.4 C  Resp: 13 12    Last Pain:  Filed Vitals:   10/18/15 1602  PainSc: Asleep                 [name]

## 2015-10-18 NOTE — Interval H&P Note (Signed)
History and Physical Interval Note:  10/18/2015 7:15 AM  [name]  has presented today for surgery, with the diagnosis of CAD  The various methods of treatment have been discussed with the patient and family. After consideration of risks, benefits and other options for treatment, the patient has consented to  Procedure(s): CORONARY ARTERY BYPASS GRAFTING (CABG) (N/A) TRANSESOPHAGEAL ECHOCARDIOGRAM (TEE) (N/A) as a surgical intervention .  The patient's history has been reviewed, patient examined, no change in status, stable for surgery.  I have reviewed the patient's chart and labs.  Questions were answered to the patient's satisfaction.     [name]

## 2015-10-18 NOTE — Transfer of Care (Signed)
Immediate Anesthesia Transfer of Care Note  Patient: [name]  Procedure(s) Performed: Procedure(s): CORONARY ARTERY BYPASS GRAFTING (CABG) x  five, using left internal mammary artery and right leg greater saphenous vein harvested endoscopically (N/A) TRANSESOPHAGEAL ECHOCARDIOGRAM (TEE) (N/A)  Patient Location: SICU  Anesthesia Type:General  Level of Consciousness: sedated and unresponsive  Airway & Oxygen Therapy: Patient remains intubated per anesthesia plan and Patient placed on Ventilator (see vital sign flow sheet for setting)  Post-op Assessment: Report given to RN and Post -op Vital signs reviewed and stable  Post vital signs: Reviewed and stable  Last Vitals:  Filed Vitals:   10/17/15 2053 10/18/15 0433  BP: 135/59 132/64  Pulse: 72 70  Temp: 36.8 C 36.7 C  Resp: 18 18    Complications: No apparent anesthesia complications

## 2015-10-18 NOTE — Op Note (Signed)
NAMEADALID, HORRALL                  ACCOUNT NO.:  [id number]  MEDICAL RECORD NO.:  [id number]  LOCATION:  2S15C                        FACILITY:  [facility]  PHYSICIAN:  Revonda Standard. Roxan Hockey, M.D.DATE OF BIRTH:  12/18/1945  DATE OF PROCEDURE:  10/18/2015 DATE OF DISCHARGE:                              OPERATIVE REPORT   PREOPERATIVE DIAGNOSIS:  Severe three-vessel coronary disease with unstable angina.  POSTOPERATIVE DIAGNOSIS:  Severe three-vessel coronary disease with unstable angina.  PROCEDURE:   Median sternotomy, extracorporeal circulation, Coronary artery bypass grafting x 5   Left internal mammary artery to left anterior descending  Saphenous vein graft to first diagonal  Saphenous vein graft to obtuse marginal 1 and 2  Saphenous vein graft to distal right coronary Endoscopic vein harvest from right leg.  SURGEON:  Revonda Standard. Roxan Hockey, M.D.  ASSISTANT:  Lars Pinks, PA.  ANESTHESIA:  General.  FINDINGS:  Transesophageal echocardiography revealed ejection fraction of approximately 45%.  No significant valvular pathology.  Mammary and saphenous vein good quality conduits, good quality targets.  CLINICAL NOTE:  Mr. Surprenant is a 70 year old man who presented with unstable angina.  He was found to have severe three-vessel coronary disease on catheterization.  Coronary artery bypass grafting was indicated for survival benefit and relief of symptoms.  The indications, risks, benefits, and alternatives were discussed in detail with the patient.  He understood and accepted the risks and agreed to proceed. There were questions whether he would have adequate conduit for bypass grafting.  Vein mapping of the right leg was encouraging, and the decision was made to proceed with surgery.  OPERATIVE NOTE:  Mr. Schnitker was brought to the preoperative holding area on October 18, 2015.  Anesthesia placed a Swan-Ganz catheter and an arterial blood pressure monitoring line.  He was  taken to the operating room, anesthetized, and intubated.  A Foley catheter was placed. Transesophageal echocardiography was performed.  Findings were as previously noted.  There was no significant valvular pathology.  There was some calcific plaque in the descending aorta and arch.  The chest, abdomen, and legs were prepped and draped in usual sterile fashion.  A median sternotomy was performed, and the left internal mammary artery was harvested using standard technique.  Simultaneously, an incision was made in the medial aspect of the right leg at the level of the knee. Greater saphenous vein was identified and then harvested endoscopically from upper calf to groin.  The saphenous vein was of good quality as well as the mammary artery.  2000 units of heparin was administered during the vessel harvest.  The remainder of full heparin dose was given prior to opening the pericardium.  The pericardium was opened.  The ascending aorta was inspected.  There was no significant atherosclerotic disease at the site for cannulation or cross-clamping.  After confirming adequate anticoagulation with ACT measurement, the aorta was cannulated via concentric 2-0 Ethibond pledgeted pursestring sutures.  A dual-stage venous cannula was placed via a pursestring sutures in the right atrial appendage.  Cardiopulmonary bypass was initiated.  Flows were maintained per protocol.  The patient was cooled to 32 degrees Celsius.  The coronary arteries were inspected, and anastomotic sites  were chosen. The conduits were inspected and cut to length.  A foam pad was placed in the pericardium to protect the left phrenic nerve.  A temperature probe was placed in the myocardial septum, and a cardioplegia cannula was placed in the ascending aorta.  The aorta was crossclamped.  The left ventricle was emptied via the aortic root vent.  Cardiac arrest then was achieved with a combination of cold antegrade blood  cardioplegia and topical iced saline.  1 L of cardioplegia was administered.  There was a rapid diastolic arrest and septal cooling to 11 degrees Celsius.  The following distal anastomoses were performed.  First, a reversed saphenous vein graft was placed end-to-side to the distal right coronary.  This was a 2 mm good quality target.  The vein was of good quality.  The end-to-side anastomosis was performed with a running 7-0 Prolene suture.  All anastomoses were probed proximally and distally at their completion to ensure patency.  Cardioplegia was administered at the completion of each vein graft to assess flow and hemostasis.  Both were good.  Next, a reversed saphenous vein graft was placed end-to-side to the first diagonal branch to the LAD.  This was a 1 mm target free of atherosclerotic disease.  Distal to the anastomosis, the vein was anastomosed end-to-side with a running 7-0 Prolene suture.  A probe passed easily.  There was satisfactory flow and good hemostasis.  Additional cardioplegia was administered down the aortic root.  Next, a reversed saphenous vein graft was placed sequentially to obtuse marginals of 1 and 2.  Obtuse marginal 1 was a 1.5 mm good quality target.  OM2 was a 1 mm vessel that was free of disease.  A side-to-side anastomosis was performed to OM1 and end-to-side to OM2.  Both were done with running 7-0 Prolene sutures.  There was good flow through the graft and good hemostasis at the anastomoses.  After administering additional cardioplegia, the left internal mammary artery was brought through a window in the pericardium.  The distal end was beveled.  It was anastomosed end-to-side to the distal LAD.  Both the mammary and LAD were 1.5 mm good quality vessels.  An end-to-side anastomosis was performed with a running 8-0 Prolene suture.  At the completion of the anastomosis, the bulldog clamp was briefly removed from the left mammary to inspect for  hemostasis.  There was rapid septal rewarming and good hemostasis.  The bulldog clamp was replaced.  The mammary pedicle was tacked to the epicardial surface of the heart with 6- 0 Prolene sutures.  Additional cardioplegia was administered.  The vein grafts were cut to length. The diagonal vein was not of sufficient length to reach the aorta for a primary proximal anastomosis.  The proximal anastomosis for the OM sequential vein and the right coronary vein graft were performed to 4.5 mm punch aortotomies with running 6-0 Prolene sutures.  At the completion of the second proximal anastomosis, the patient was placed in Trendelenburg position.  Lidocaine was administered.  The left ventricle and aortic root were de-aired, and the aortic crossclamp was removed. Total crossclamp time was 76 minutes.  The patient initially fibrillated but converted to sinus rhythm with a single defibrillation with 10 joules and remained in sinus rhythm thereafter.  Bulldog clamps were placed proximally and distally on the OM vein.  A longitudinal venotomy was made, and the proximal anastomosis for the diagonal graft was performed end-to-side to the OM vein as a Y graft. This anastomosis was  performed with a running 7-0 Prolene suture.  While rewarming was completed, all proximal and distal anastomoses were inspected for hemostasis.  Epicardial pacing wires were placed on the right ventricle and right atrium.  When the patient rewarmed to a core temperature of 37 degrees Celsius, he was weaned for cardiopulmonary bypass on the first attempt.  Total bypass time was 119 minutes.  The initial cardiac index was greater than 2 L/min/m2.  The patient remained hemodynamically stable throughout the post bypass period.  The postbypass transesophageal echocardiography study was essentially unchanged from the prebypass study.  A test dose of protamine was administered and was well tolerated.  The atrial and aortic  cannulae were removed.  The remainder of the protamine was administered without incident.  The chest was irrigated with warm saline.  Hemostasis was achieved.  The pericardium was reapproximated over the ascending aorta and base of the heart with interrupted 3-0 silk sutures.  Left pleural and mediastinal chest tubes were placed through separate subcostal incisions.  The sternum was closed with a combination of single and double heavy gauge stainless steel wires.  The patient tolerated the sternal closure well hemodynamically.  The pectoralis fascia, subcutaneous tissue, and skin were closed in standard fashion. All sponge, needle, and instrument counts were correct at the end of the procedure.  The patient was taken from the operating room to the surgical intensive care unit intubated and in stable condition.     Revonda Standard Roxan Hockey, M.D.     SCH/MEDQ  D:  10/18/2015  T:  10/18/2015  Job:  [id number]

## 2015-10-18 NOTE — Procedures (Signed)
Extubation Procedure Note  Patient Details:   Name: [name] DOB: 07/04/1946 MRN: [id number]   Airway Documentation:     Evaluation  O2 sats: stable throughout Complications: No apparent complications Patient did tolerate procedure well. Bilateral Breath Sounds: Clear, Diminished   Yes  Positive cuff leak NIF-20 FVC-871ml Incentive instructed 663ml  Revonda Standard 10/18/2015, 4:51 PM

## 2015-10-18 NOTE — Anesthesia Procedure Notes (Addendum)
Procedure Name: Intubation Date/Time: 10/18/2015 8:08 AM Performed by: Suzy Bouchard Pre-anesthesia Checklist: Patient identified, Emergency Drugs available, Suction available, Timeout performed and Patient being monitored Patient Re-evaluated:Patient Re-evaluated prior to inductionOxygen Delivery Method: Circle system utilized Preoxygenation: Pre-oxygenation with 100% oxygen Intubation Type: IV induction Ventilation: Mask ventilation without difficulty Laryngoscope Size: Miller and 2 Grade View: Grade II Tube type: Oral Laser Tube: Cuffed inflated with minimal occlusive pressure - saline Tube size: 8.0 mm Number of attempts: 1 Airway Equipment and Method: Stylet Placement Confirmation: ETT inserted through vocal cords under direct vision,  positive ETCO2 and breath sounds checked- equal and bilateral Secured at: 22 cm Tube secured with: Tape Dental Injury: Teeth and Oropharynx as per pre-operative assessment     Procedures: Right IJ Gordy Councilman Catheter Insertion: [id number]: The patient was identified and consent obtained.  TO was performed, and full barrier precautions were used.  The skin was anesthetized with lidocaine-4cc plain with 25g needle.  Once the vein was located with the 22 ga. needle using ultrasound guidance , the wire was inserted into the vein.  The wire location was confirmed with ultrasound.  The tissue was dilated and the 8.5 [place] cordis catheter was carefully inserted. Afterwards Gordy Councilman catheter was inserted. PA catheter at 45cm.  The patient tolerated the procedure well.   CE

## 2015-10-18 NOTE — H&P (View-Only) (Signed)
4 Days Post-Op Procedure(s) (LRB): Left Heart Cath and Coronary Angiography (N/A) Subjective: No complaints, denies CP  Objective: Vital signs in last 24 hours: Temp:  [97.8 F (36.6 C)-98.8 F (37.1 C)] 98.8 F (37.1 C) (03/03 1325) Pulse Rate:  [64-82] 72 (03/03 1325) Cardiac Rhythm:  [-] Normal sinus rhythm (03/03 0828) Resp:  [18] 18 (03/03 1325) BP: (106-141)/(43-75) 106/43 mmHg (03/03 1325) SpO2:  [94 %-99 %] 99 % (03/03 1325) Weight:  [151 lb 14.4 oz (68.9 kg)] 151 lb 14.4 oz (68.9 kg) (03/03 0546)  Hemodynamic parameters for last 24 hours:    Intake/Output from previous day: 03/02 0701 - 03/03 0700 In: 1315 [P.O.:1315] Out: -  Intake/Output this shift: Total I/O In: 600 [P.O.:600] Out: -   unchanged  Lab Results:  Recent Labs  10/13/15 0500 10/14/15 0400  WBC 8.7 9.3  HGB 10.7* 10.9*  HCT 34.1* 35.0*  PLT 455* 464*   BMET:  Recent Labs  10/14/15 0400 10/15/15 0320  NA 138 135  K 5.4* 5.0  CL 103 99*  CO2 29 26  GLUCOSE 235* 264*  BUN 15 16  CREATININE 1.43* 1.56*  CALCIUM 9.4 9.2    PT/INR: No results for input(s): LABPROT, INR in the last 72 hours. ABG    Component Value Date/Time   PHART 7.427 07/09/2015 0609   HCO3 18.9* 07/09/2015 0609   TCO2 11.7 07/09/2015 0609   O2SAT 94.9 07/09/2015 0609   CBG (last 3)   Recent Labs  10/14/15 2200 10/15/15 0609 10/15/15 1143  GLUCAP 238* 198* 289*    Assessment/Plan: S/P Procedure(s) (LRB): Left Heart Cath and Coronary Angiography (N/A) -  For CABG on Monday His CBG remain poorly controlled- will increase lantus from 14 to 30 and add meal coverage. Need to get CBG better controlled prior to surgery  PFTs- mild COPD   LOS: 6 days    [name] 10/15/2015

## 2015-10-19 ENCOUNTER — Inpatient Hospital Stay (HOSPITAL_COMMUNITY): Payer: Non-veteran care

## 2015-10-19 ENCOUNTER — Encounter (HOSPITAL_COMMUNITY): Payer: Self-pay | Admitting: Thoracic Surgery (Cardiothoracic Vascular Surgery)

## 2015-10-19 LAB — CREATININE, SERUM
CREATININE: 1.09 mg/dL (ref 0.61–1.24)
Creatinine, Ser: 1.11 mg/dL (ref 0.61–1.24)
GFR calc Af Amer: >60 mL/min (ref >60)
GFR calc Af Amer: >60 mL/min (ref >60)
GFR calc non Af Amer: >60 mL/min (ref >60)
GFR calc non Af Amer: >60 mL/min (ref >60)

## 2015-10-19 LAB — GLUCOSE, CAPILLARY
GLUCOSE-CAPILLARY: 121 mg/dL — AB (ref 65–99)
GLUCOSE-CAPILLARY: 114 mg/dL — AB (ref 65–99)
GLUCOSE-CAPILLARY: 116 mg/dL — AB (ref 65–99)
GLUCOSE-CAPILLARY: 122 mg/dL — AB (ref 65–99)
GLUCOSE-CAPILLARY: 116 mg/dL — AB (ref 65–99)
GLUCOSE-CAPILLARY: 108 mg/dL — AB (ref 65–99)
GLUCOSE-CAPILLARY: 112 mg/dL — AB (ref 65–99)
GLUCOSE-CAPILLARY: 106 mg/dL — AB (ref 65–99)
GLUCOSE-CAPILLARY: 163 mg/dL — AB (ref 65–99)
Glucose-Capillary: 121 mg/dL — ABNORMAL HIGH (ref 65–99)
Glucose-Capillary: 129 mg/dL — ABNORMAL HIGH (ref 65–99)
Glucose-Capillary: 119 mg/dL — ABNORMAL HIGH (ref 65–99)
Glucose-Capillary: 133 mg/dL — ABNORMAL HIGH (ref 65–99)
Glucose-Capillary: 102 mg/dL — ABNORMAL HIGH (ref 65–99)
Glucose-Capillary: 104 mg/dL — ABNORMAL HIGH (ref 65–99)
Glucose-Capillary: 113 mg/dL — ABNORMAL HIGH (ref 65–99)
Glucose-Capillary: 123 mg/dL — ABNORMAL HIGH (ref 65–99)

## 2015-10-19 LAB — CBC
HEMATOCRIT: 26 % — AB (ref 39.0–52.0)
HEMATOCRIT: 25.2 % — AB (ref 39.0–52.0)
HEMATOCRIT: 25.6 % — AB (ref 39.0–52.0)
HEMOGLOBIN: 8.1 g/dL — AB (ref 13.0–17.0)
HEMOGLOBIN: 8.3 g/dL — AB (ref 13.0–17.0)
Hemoglobin: 8.3 g/dL — ABNORMAL LOW (ref 13.0–17.0)
MCH: 27.2 pg (ref 26.0–34.0)
MCH: 28.5 pg (ref 26.0–34.0)
MCH: 28.1 pg (ref 26.0–34.0)
MCHC: 31.2 g/dL (ref 30.0–36.0)
MCHC: 32.9 g/dL (ref 30.0–36.0)
MCHC: 32.4 g/dL (ref 30.0–36.0)
MCV: 87.2 fL (ref 78.0–100.0)
MCV: 86.6 fL (ref 78.0–100.0)
MCV: 86.8 fL (ref 78.0–100.0)
PLATELETS: 256 10*3/uL (ref 150–400)
Platelets: 259 10*3/uL (ref 150–400)
Platelets: 231 10*3/uL (ref 150–400)
RBC: 2.98 MIL/uL — ABNORMAL LOW (ref 4.22–5.81)
RBC: 2.91 MIL/uL — ABNORMAL LOW (ref 4.22–5.81)
RBC: 2.95 MIL/uL — ABNORMAL LOW (ref 4.22–5.81)
RDW: 16.1 % — ABNORMAL HIGH (ref 11.5–15.5)
RDW: 16.1 % — ABNORMAL HIGH (ref 11.5–15.5)
RDW: 16.3 % — AB (ref 11.5–15.5)
WBC: 13.7 10*3/uL — AB (ref 4.0–10.5)
WBC: 14.1 10*3/uL — ABNORMAL HIGH (ref 4.0–10.5)
WBC: 15.2 10*3/uL — ABNORMAL HIGH (ref 4.0–10.5)

## 2015-10-19 LAB — POCT I-STAT, CHEM 8
BUN: 14 mg/dL (ref 6–20)
CHLORIDE: 97 mmol/L — AB (ref 101–111)
Calcium, Ion: 1.15 mmol/L (ref 1.13–1.30)
Creatinine, Ser: 1.1 mg/dL (ref 0.61–1.24)
GLUCOSE: 130 mg/dL — AB (ref 65–99)
HEMATOCRIT: 26 % — AB (ref 39.0–52.0)
Hemoglobin: 8.8 g/dL — ABNORMAL LOW (ref 13.0–17.0)
POTASSIUM: 5.4 mmol/L — AB (ref 3.5–5.1)
Sodium: 134 mmol/L — ABNORMAL LOW (ref 135–145)
TCO2: 24 mmol/L (ref 0–100)

## 2015-10-19 LAB — MAGNESIUM
MAGNESIUM: 2.8 mg/dL — AB (ref 1.7–2.4)
Magnesium: 2.6 mg/dL — ABNORMAL HIGH (ref 1.7–2.4)

## 2015-10-19 LAB — BASIC METABOLIC PANEL
ANION GAP: 7 (ref 5–15)
BUN: 13 mg/dL (ref 6–20)
CALCIUM: 8 mg/dL — AB (ref 8.9–10.3)
CHLORIDE: 105 mmol/L (ref 101–111)
CO2: 23 mmol/L (ref 22–32)
CREATININE: 1.04 mg/dL (ref 0.61–1.24)
GFR calc non Af Amer: >60 mL/min (ref >60)
Glucose, Bld: 116 mg/dL — ABNORMAL HIGH (ref 65–99)
Potassium: 4.8 mmol/L (ref 3.5–5.1)
Sodium: 135 mmol/L (ref 135–145)

## 2015-10-19 MED ORDER — INSULIN DETEMIR 100 UNIT/ML ~~LOC~~ SOLN
30.0000 [IU] | Freq: Every day | SUBCUTANEOUS | Status: DC
  Administered 2015-10-19: 30 [IU] via SUBCUTANEOUS
  Filled 2015-10-19 (×2): qty 0.3

## 2015-10-19 MED ORDER — ENOXAPARIN SODIUM 40 MG/0.4ML ~~LOC~~ SOLN
40.0000 mg | Freq: Every day | SUBCUTANEOUS | Status: DC
  Administered 2015-10-19 – 2015-10-21 (×3): 40 mg via SUBCUTANEOUS
  Filled 2015-10-19 (×3): qty 0.4

## 2015-10-19 MED ORDER — INSULIN ASPART 100 UNIT/ML ~~LOC~~ SOLN
0.0000 [IU] | SUBCUTANEOUS | Status: DC
  Administered 2015-10-19: 2 [IU] via SUBCUTANEOUS
  Administered 2015-10-19: 4 [IU] via SUBCUTANEOUS
  Administered 2015-10-20: 2 [IU] via SUBCUTANEOUS

## 2015-10-19 MED ORDER — GLIPIZIDE 10 MG PO TABS
10.0000 mg | ORAL_TABLET | Freq: Two times a day (BID) | ORAL | Status: DC
Start: 2015-10-19 — End: 2015-10-22
  Administered 2015-10-19 – 2015-10-22 (×6): 10 mg via ORAL
  Filled 2015-10-19 (×8): qty 1

## 2015-10-19 MED ORDER — INSULIN ASPART 100 UNIT/ML ~~LOC~~ SOLN
4.0000 [IU] | Freq: Three times a day (TID) | SUBCUTANEOUS | Status: DC
  Administered 2015-10-19 – 2015-10-20 (×3): 4 [IU] via SUBCUTANEOUS

## 2015-10-19 MED ORDER — INSULIN DETEMIR 100 UNIT/ML ~~LOC~~ SOLN: 30.0000 [IU] | Freq: Every day | SUBCUTANEOUS | Status: DC

## 2015-10-19 MED ORDER — CLOPIDOGREL BISULFATE 75 MG PO TABS
75.0000 mg | ORAL_TABLET | Freq: Every day | ORAL | Status: DC
  Administered 2015-10-19 – 2015-10-22 (×4): 75 mg via ORAL
  Filled 2015-10-19 (×4): qty 1

## 2015-10-19 MED FILL — Sodium Chloride IV Soln 0.9%: INTRAVENOUS | Qty: 2000 | Status: AC

## 2015-10-19 MED FILL — Electrolyte-R (PH 7.4) Solution: INTRAVENOUS | Qty: 5000 | Status: AC

## 2015-10-19 MED FILL — Sodium Bicarbonate IV Soln 8.4%: INTRAVENOUS | Qty: 50 | Status: AC

## 2015-10-19 MED FILL — Heparin Sodium (Porcine) Inj 1000 Unit/ML: INTRAMUSCULAR | Qty: 10 | Status: AC

## 2015-10-19 MED FILL — Albumin, Human Inj 5%: INTRAVENOUS | Qty: 250 | Status: AC

## 2015-10-19 MED FILL — Mannitol IV Soln 20%: INTRAVENOUS | Qty: 500 | Status: AC

## 2015-10-19 MED FILL — Lidocaine HCl IV Inj 20 MG/ML: INTRAVENOUS | Qty: 5 | Status: AC

## 2015-10-19 NOTE — Progress Notes (Signed)
TCTS BRIEF SICU PROGRESS NOTE  1 Day Post-Op  S/P Procedure(s) (LRB): CORONARY ARTERY BYPASS GRAFTING (CABG) x  five, using left internal mammary artery and right leg greater saphenous vein harvested endoscopically (N/A) TRANSESOPHAGEAL ECHOCARDIOGRAM (TEE) (N/A)   Stable day Ambulated some NSR- AAI paced rhythm Stable BP off all drips O2 sats 96% on 2 L/min UOP adequate Labs okay  Plan: Continue routine care  Rexene Alberts, MD 10/19/2015 6:48 PM

## 2015-10-19 NOTE — Progress Notes (Signed)
1 Day Post-Op Procedure(s) (LRB): CORONARY ARTERY BYPASS GRAFTING (CABG) x  five, using left internal mammary artery and right leg greater saphenous vein harvested endoscopically (N/A) TRANSESOPHAGEAL ECHOCARDIOGRAM (TEE) (N/A) Subjective: Some incisional pain  Objective: Vital signs in last 24 hours: Temp:  [97 F (36.1 C)-98.4 F (36.9 C)] 98.1 F (36.7 C) (03/07 0700) Pulse Rate:  [68-82] 80 (03/07 0700) Cardiac Rhythm:  [-] Atrial paced (03/07 0731) Resp:  [12-22] 18 (03/07 0700) BP: (97-140)/(54-109) 123/66 mmHg (03/07 0700) SpO2:  [96 %-100 %] 97 % (03/07 0700) Arterial Line BP: (69-132)/(43-78) 122/58 mmHg (03/07 0700) FiO2 (%):  [40 %-50 %] 40 % (03/06 1645) Weight:  [160 lb 7.9 oz (72.8 kg)] 160 lb 7.9 oz (72.8 kg) (03/07 0500)  Hemodynamic parameters for last 24 hours: PAP: (17-39)/(9-20) 32/17 mmHg CO:  [3.1 L/min-4.7 L/min] 4.7 L/min CI:  [1.7 L/min/m2-2.6 L/min/m2] 2.6 L/min/m2  Intake/Output from previous day: 03/06 0701 - 03/07 0700 In: 4509.5 [I.V.:2829.5; Blood:400; NG/GT:30; IV Piggyback:1250] Out: [id number] [Urine:2435; Blood:1900; Chest Tube:260] Intake/Output this shift:    General appearance: alert, cooperative and no distress Neurologic: intact Heart: RRR, loud friction rub Lungs: diminished breath sounds bibasilar Abdomen: normal findings: soft, non-tender  Lab Results:  Recent Labs  10/18/15 1840 10/18/15 1847 10/19/15 0429  WBC 19.0*  --  13.7*  HGB 8.6* 8.8* 8.1*  HCT 26.9* 26.0* 26.0*  PLT 282  --  259   BMET:  Recent Labs  10/17/15 2350  10/18/15 1847 10/19/15 0429  NA 135  < > 133* 135  K 4.8  < > 4.6 4.8  CL 95*  < > 81* 105  CO2 28  --   --  23  GLUCOSE 201*  < > 136* 116*  BUN 19  < > 18 13  CREATININE 1.46*  < > 1.00 1.04  CALCIUM 9.3  --   --  8.0*  < > = values in this interval not displayed.  PT/INR:  Recent Labs  10/18/15 1312  LABPROT 19.0*  INR 1.59*   ABG    Component Value Date/Time   PHART 7.364  10/18/2015 1758   HCO3 22.6 10/18/2015 1758   TCO2 24 10/18/2015 1847   ACIDBASEDEF 3.0* 10/18/2015 1758   O2SAT 96.0 10/18/2015 1758   CBG (last 3)   Recent Labs  10/19/15 0458 10/19/15 0602 10/19/15 0653  GLUCAP 133* 116* 122*    Assessment/Plan: S/P Procedure(s) (LRB): CORONARY ARTERY BYPASS GRAFTING (CABG) x  five, using left internal mammary artery and right leg greater saphenous vein harvested endoscopically (N/A) TRANSESOPHAGEAL ECHOCARDIOGRAM (TEE) (N/A) POD # 1  CV- stable, dc swan and A line  ASA, beta blocker, statin  Restart plavix for peripheral stent  Resume ACE-I prior to dc  Rub c/w pericarditis  RESP_ IS for bibasilar atelectasis  RENAL- creatinine and lytes OK  ENDO- Diabetes poorly controlled preop- transition form insulin drip to levemir + SSI  Anemia secondary to ABL- follow  DVT prophylaxis- start enoxaparin   LOS: 10 days    [name] 10/19/2015

## 2015-10-19 NOTE — Care Management Note (Signed)
Case Management Note  Patient Details  Name: KAMYAR CAMMISA MRN: [id number] Date of Birth: 05-Oct-1945  Subjective/Objective:      Notified [facility] of patient here.  Post op CABG x5 on 3-6.  Due to patient's PCP being in [place] clinic also notified [place] office.  [place] will be the one assisting with any home health needs on discharge.               Action/Plan:   Expected Discharge Date:  10/12/15               Expected Discharge Plan:  Home/Self Care  In-House Referral:  Clinical Social Work  Discharge planning Services     Post Acute Care Choice:    Choice offered to:     DME Arranged:    DME Agency:     HH Arranged:    HH Agency:     Status of Service:  In process, will continue to follow  Medicare Important Message Given:    Date Medicare IM Given:    Medicare IM give by:    Date Additional Medicare IM Given:    Additional Medicare Important Message give by:     If discussed at [place] of Stay Meetings, dates discussed:    Additional Comments:  Vergie Living, RN 10/19/2015, 3:24 PM

## 2015-10-20 ENCOUNTER — Inpatient Hospital Stay (HOSPITAL_COMMUNITY): Payer: Non-veteran care

## 2015-10-20 LAB — GLUCOSE, CAPILLARY
GLUCOSE-CAPILLARY: 180 mg/dL — AB (ref 65–99)
GLUCOSE-CAPILLARY: 142 mg/dL — AB (ref 65–99)
Glucose-Capillary: 120 mg/dL — ABNORMAL HIGH (ref 65–99)
Glucose-Capillary: 121 mg/dL — ABNORMAL HIGH (ref 65–99)
Glucose-Capillary: 126 mg/dL — ABNORMAL HIGH (ref 65–99)
Glucose-Capillary: 73 mg/dL (ref 65–99)

## 2015-10-20 LAB — BASIC METABOLIC PANEL
ANION GAP: 9 (ref 5–15)
BUN: 14 mg/dL (ref 6–20)
CHLORIDE: 102 mmol/L (ref 101–111)
CO2: 26 mmol/L (ref 22–32)
Calcium: 8.6 mg/dL — ABNORMAL LOW (ref 8.9–10.3)
Creatinine, Ser: 1.13 mg/dL (ref 0.61–1.24)
GFR calc Af Amer: >60 mL/min (ref >60)
GLUCOSE: 79 mg/dL (ref 65–99)
POTASSIUM: 4.8 mmol/L (ref 3.5–5.1)
Sodium: 137 mmol/L (ref 135–145)

## 2015-10-20 LAB — CBC
HEMATOCRIT: 25.3 % — AB (ref 39.0–52.0)
HEMOGLOBIN: 7.9 g/dL — AB (ref 13.0–17.0)
MCH: 27.2 pg (ref 26.0–34.0)
MCHC: 31.2 g/dL (ref 30.0–36.0)
MCV: 87.2 fL (ref 78.0–100.0)
Platelets: 271 10*3/uL (ref 150–400)
RBC: 2.9 MIL/uL — ABNORMAL LOW (ref 4.22–5.81)
RDW: 16.1 % — AB (ref 11.5–15.5)
WBC: 12.3 10*3/uL — ABNORMAL HIGH (ref 4.0–10.5)

## 2015-10-20 MED ORDER — IPRATROPIUM-ALBUTEROL 0.5-2.5 (3) MG/3ML IN SOLN: 3.0000 mL | Freq: Four times a day (QID) | RESPIRATORY_TRACT | Status: DC | PRN

## 2015-10-20 MED ORDER — AMLODIPINE BESYLATE 10 MG PO TABS
10.0000 mg | ORAL_TABLET | Freq: Every day | ORAL | Status: DC
  Administered 2015-10-20 – 2015-10-22 (×3): 10 mg via ORAL
  Filled 2015-10-20 (×3): qty 1

## 2015-10-20 MED ORDER — SODIUM CHLORIDE 0.9% FLUSH
3.0000 mL | Freq: Two times a day (BID) | INTRAVENOUS | Status: DC
  Administered 2015-10-20 – 2015-10-22 (×3): 3 mL via INTRAVENOUS

## 2015-10-20 MED ORDER — METFORMIN HCL 500 MG PO TABS
1000.0000 mg | ORAL_TABLET | Freq: Two times a day (BID) | ORAL | Status: DC
  Administered 2015-10-20 – 2015-10-22 (×5): 1000 mg via ORAL
  Filled 2015-10-20 (×5): qty 2

## 2015-10-20 MED ORDER — INSULIN ASPART 100 UNIT/ML ~~LOC~~ SOLN
0.0000 [IU] | Freq: Three times a day (TID) | SUBCUTANEOUS | Status: DC
  Administered 2015-10-20: 4 [IU] via SUBCUTANEOUS
  Administered 2015-10-20: 3 [IU] via SUBCUTANEOUS
  Administered 2015-10-21: 4 [IU] via SUBCUTANEOUS
  Administered 2015-10-21: 3 [IU] via SUBCUTANEOUS
  Administered 2015-10-22: 4 [IU] via SUBCUTANEOUS

## 2015-10-20 MED ORDER — ALUM & MAG HYDROXIDE-SIMETH 200-200-20 MG/5ML PO SUSP: 15.0000 mL | ORAL | Status: DC | PRN

## 2015-10-20 MED ORDER — HYDROCHLOROTHIAZIDE 25 MG PO TABS
25.0000 mg | ORAL_TABLET | Freq: Every day | ORAL | Status: DC
  Administered 2015-10-20 – 2015-10-22 (×3): 25 mg via ORAL
  Filled 2015-10-20 (×3): qty 1

## 2015-10-20 MED ORDER — ASPIRIN EC 81 MG PO TBEC
81.0000 mg | DELAYED_RELEASE_TABLET | Freq: Every day | ORAL | Status: DC
  Administered 2015-10-20 – 2015-10-22 (×3): 81 mg via ORAL
  Filled 2015-10-20 (×3): qty 1

## 2015-10-20 MED ORDER — GUAIFENESIN-DM 100-10 MG/5ML PO SYRP: 15.0000 mL | ORAL_SOLUTION | ORAL | Status: DC | PRN

## 2015-10-20 MED ORDER — INSULIN DETEMIR 100 UNIT/ML ~~LOC~~ SOLN
15.0000 [IU] | Freq: Every day | SUBCUTANEOUS | Status: DC
  Administered 2015-10-20 – 2015-10-22 (×3): 15 [IU] via SUBCUTANEOUS
  Filled 2015-10-20 (×3): qty 0.15

## 2015-10-20 MED ORDER — MAGNESIUM HYDROXIDE 400 MG/5ML PO SUSP
30.0000 mL | Freq: Every day | ORAL | Status: DC | PRN
  Administered 2015-10-21: 30 mL via ORAL
  Filled 2015-10-20: qty 30

## 2015-10-20 MED ORDER — IPRATROPIUM-ALBUTEROL 20-100 MCG/ACT IN AERS: 2.0000 | INHALATION_SPRAY | Freq: Four times a day (QID) | RESPIRATORY_TRACT | Status: DC | PRN

## 2015-10-20 MED ORDER — SODIUM CHLORIDE 0.9 % IV SOLN: 250.0000 mL | INTRAVENOUS | Status: DC | PRN

## 2015-10-20 MED ORDER — MOVING RIGHT ALONG BOOK
Freq: Once | Status: AC
  Administered 2015-10-20: 17:00:00
  Filled 2015-10-20: qty 1

## 2015-10-20 MED ORDER — ZOLPIDEM TARTRATE 5 MG PO TABS: 5.0000 mg | ORAL_TABLET | Freq: Every evening | ORAL | Status: DC | PRN

## 2015-10-20 MED ORDER — SODIUM CHLORIDE 0.9% FLUSH: 3.0000 mL | INTRAVENOUS | Status: DC | PRN

## 2015-10-20 NOTE — Progress Notes (Signed)
2 Days Post-Op Procedure(s) (LRB): CORONARY ARTERY BYPASS GRAFTING (CABG) x  five, using left internal mammary artery and right leg greater saphenous vein harvested endoscopically (N/A) TRANSESOPHAGEAL ECHOCARDIOGRAM (TEE) (N/A) Subjective: Some incisional pain Denies nausea  Objective: Vital signs in last 24 hours: Temp:  [97.7 F (36.5 C)-98.3 F (36.8 C)] 97.7 F (36.5 C) (03/08 0700) Pulse Rate:  [67-80] 79 (03/08 0600) Cardiac Rhythm:  [-] Atrial paced (03/08 0747) Resp:  [7-29] 20 (03/08 0600) BP: (117-148)/(65-95) 134/95 mmHg (03/08 0600) SpO2:  [94 %-98 %] 95 % (03/08 0600) Arterial Line BP: (119-142)/(52-66) 133/63 mmHg (03/07 1600) Weight:  [159 lb 6.3 oz (72.3 kg)] 159 lb 6.3 oz (72.3 kg) (03/08 0500)  Hemodynamic parameters for last 24 hours: PAP: (34)/(16) 34/16 mmHg  Intake/Output from previous day: 03/07 0701 - 03/08 0700 In: 780 [I.V.:680; IV Piggyback:100] Out: 1200 [Urine:1150; Chest Tube:50] Intake/Output this shift:    General appearance: alert, cooperative and no distress Neurologic: intact Heart: regular rate and rhythm Lungs: diminished breath sounds bibasilar Abdomen: normal findings: soft, non-tender Wound: clean and dry  Lab Results:  Recent Labs  10/19/15 1623 10/19/15 1628 10/20/15 0347  WBC 15.2*  --  12.3*  HGB 8.3* 8.8* 7.9*  HCT 25.6* 26.0* 25.3*  PLT 256  --  271   BMET:  Recent Labs  10/19/15 0429  10/19/15 1628 10/20/15 0347  NA 135  --  134* 137  K 4.8  --  5.4* 4.8  CL 105  --  97* 102  CO2 23  --   --  26  GLUCOSE 116*  --  130* 79  BUN 13  --  14 14  CREATININE 1.04  < > 1.10 1.13  CALCIUM 8.0*  --   --  8.6*  < > = values in this interval not displayed.  PT/INR:  Recent Labs  10/18/15 1312  LABPROT 19.0*  INR 1.59*   ABG    Component Value Date/Time   PHART 7.364 10/18/2015 1758   HCO3 22.6 10/18/2015 1758   TCO2 24 10/19/2015 1628   ACIDBASEDEF 3.0* 10/18/2015 1758   O2SAT 96.0 10/18/2015 1758    CBG (last 3)   Recent Labs  10/19/15 1938 10/20/15 0004 10/20/15 0404  GLUCAP 163* 126* 10    Assessment/Plan: S/P Procedure(s) (LRB): CORONARY ARTERY BYPASS GRAFTING (CABG) x  five, using left internal mammary artery and right leg greater saphenous vein harvested endoscopically (N/A) TRANSESOPHAGEAL ECHOCARDIOGRAM (TEE) (N/A) Plan for transfer to step-down: see transfer orders  CV- stable, in SR PAD- on plavix for stent  RESP- IS for bibasilar atelectasis  RENAL- creatinine and lytes OK  ENDO- CBG well controlled- restart metformin, decrease levemir  DVT prophylaxis- enoxaparin  Ambulating well   LOS: 11 days    [name] 10/20/2015

## 2015-10-20 NOTE — Care Management Note (Signed)
Case Management Note  Patient Details  Name: DANTE KEETCH MRN: [id number] Date of Birth: 1946/04/12  Subjective/Objective:   Lives home with wife, independent prior.  Wife will be with him 24/7 on discharge.                   Action/Plan:   Expected Discharge Date:  10/12/15               Expected Discharge Plan:  Home/Self Care  In-House Referral:  Clinical Social Work  Discharge planning Services     Post Acute Care Choice:    Choice offered to:     DME Arranged:    DME Agency:     HH Arranged:    HH Agency:     Status of Service:  In process, will continue to follow  Medicare Important Message Given:    Date Medicare IM Given:    Medicare IM give by:    Date Additional Medicare IM Given:    Additional Medicare Important Message give by:     If discussed at [place] of Stay Meetings, dates discussed:    Additional Comments:  Vergie Living, RN 10/20/2015, 11:29 AM

## 2015-10-20 NOTE — Progress Notes (Signed)
Patient ambulated from 2S15 to new room 2W05, belongings at bedside along with SCDs, receiving RN in room, patient placed on tele, no further questions from patient or receiving RN, spoke with wife and let her know Mr. Canfield new room.  Rowe Pavy, RN

## 2015-10-21 ENCOUNTER — Inpatient Hospital Stay (HOSPITAL_COMMUNITY): Payer: Non-veteran care

## 2015-10-21 LAB — TYPE AND SCREEN
ABO/RH(D): A POS
ANTIBODY SCREEN: NEGATIVE
UNIT DIVISION: 0
UNIT DIVISION: 0

## 2015-10-21 LAB — CBC
HCT: 27.4 % — ABNORMAL LOW (ref 39.0–52.0)
Hemoglobin: 9.1 g/dL — ABNORMAL LOW (ref 13.0–17.0)
MCH: 28.8 pg (ref 26.0–34.0)
MCHC: 33.2 g/dL (ref 30.0–36.0)
MCV: 86.7 fL (ref 78.0–100.0)
PLATELETS: 303 10*3/uL (ref 150–400)
RBC: 3.16 MIL/uL — AB (ref 4.22–5.81)
RDW: 16.1 % — ABNORMAL HIGH (ref 11.5–15.5)
WBC: 13.5 10*3/uL — AB (ref 4.0–10.5)

## 2015-10-21 LAB — GLUCOSE, CAPILLARY
GLUCOSE-CAPILLARY: 134 mg/dL — AB (ref 65–99)
GLUCOSE-CAPILLARY: 167 mg/dL — AB (ref 65–99)
Glucose-Capillary: 113 mg/dL — ABNORMAL HIGH (ref 65–99)
Glucose-Capillary: 127 mg/dL — ABNORMAL HIGH (ref 65–99)

## 2015-10-21 LAB — BASIC METABOLIC PANEL
ANION GAP: 14 (ref 5–15)
BUN: 16 mg/dL (ref 6–20)
CALCIUM: 8.9 mg/dL (ref 8.9–10.3)
CO2: 25 mmol/L (ref 22–32)
Chloride: 93 mmol/L — ABNORMAL LOW (ref 101–111)
Creatinine, Ser: 1.1 mg/dL (ref 0.61–1.24)
GFR calc Af Amer: >60 mL/min (ref >60)
Glucose, Bld: 110 mg/dL — ABNORMAL HIGH (ref 65–99)
POTASSIUM: 4.3 mmol/L (ref 3.5–5.1)
SODIUM: 132 mmol/L — AB (ref 135–145)

## 2015-10-21 MED ORDER — METOPROLOL TARTRATE 25 MG PO TABS
25.0000 mg | ORAL_TABLET | Freq: Two times a day (BID) | ORAL | Status: DC
  Administered 2015-10-21 – 2015-10-22 (×3): 25 mg via ORAL
  Filled 2015-10-21 (×3): qty 1

## 2015-10-21 MED ORDER — METOPROLOL TARTRATE 25 MG/10 ML ORAL SUSPENSION: 25.0000 mg | Freq: Two times a day (BID) | ORAL | Status: DC

## 2015-10-21 NOTE — Discharge Summary (Signed)
Physician Discharge Summary       [address]       [place] [address]             [phone]    Patient ID: [name] MRN: [id number] DOB/AGE: [birth year] 70 y.o.  Admit date: 10/09/2015 Discharge date: 10/22/2015  Admission Diagnoses: 1. Unstable angina 2. CAD (s/p percutaneous coronary angioplasty to Circumflex 98') 3. Sepsis   Active Diagnoses:  1. Essential hypertension 2. HLD (hyperlipidemia) 3. Type 2 diabetes mellitus ([place]) 4. CKD (chronic kidney disease) stage 2, GFR 60-89 ml/min 5. GERD (gastroesophageal reflux disease) 6. Neuropathy ([place]) 7. Peripheral vascular disease (s/p R-L FEM-FEM BYPASS) ([place]) 8. Anemia  Procedure (s):  Left Heart Cath and Coronary Angiography by Dr. Martinique on 10/11/2015:    Conclusion     Prox RCA lesion, 90% stenosed.  Prox RCA to Mid RCA lesion, 95% stenosed.  Prox Cx to Mid Cx lesion, 80% stenosed.  Ost LAD to Prox LAD lesion, 85% stenosed.  Mid LAD lesion, 85% stenosed.  1st Diag lesion, 70% stenosed.  1. Severe 3 vessel obstructive CAD   Plan: CT surgery consult for CABG. Hold Plavix    Median sternotomy, extracorporeal circulation, coronary artery bypass grafting x5 (left internal mammary artery to left anterior descending, saphenous vein graft to first diagonal, saphenous vein graft to obtuse marginal 1 and 2, saphenous vein graft to distal right coronary), endoscopic vein harvest from right leg by Dr. Roxan Hockey on 10/18/2015.  History of Presenting Illness: [name] is a 70 yo man with multiple CRF including type II diabetes, hypertension, hyperlipidemia. He has known CAD with a stent placed in the circumflex many years ago. He also has PAD with previous left fem pop with Goretex and and a right to left fem fem bypass. He began having CP about a week prior to admission. Initially this was exertional and relieved with rest. The night before admission he had a severe episode at rest with  radiation to the left arm. He felt feverish and short of breath. He ruled out for MI. Yesterday he had cardiac catheterization which revealed severe 3 vessel CAD.  Was treated for presumed pneumonia with vanco and zosyn. Lactic acid and pro-calcitonin were WNL.  CABG indicated for survival benefit and relief of symptoms.   The biggest issue is likely to be available conduit. He has PAD. Had a left fem pop bypass years ago with Goretex. 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 Will need vein mapping and doppler assessment of his palmar arch.  Dr. Roxan Hockey discussed the general nature of the procedure, the need for general anesthesia, the use of cardiopulmonary bypass, and incisions to be used with the patient and his family. He discussed the expected hospital stay, overall recovery and short and long term outcomes. I reviewed the indications, risks, benefits and alternatives. They understand the risks include, but are not limited to death, stroke, MI, DVT/PE, bleeding, possible need for transfusion, infections, cardiac arrhythmias, and other organ system dysfunction including respiratory, renal, or GI complications.   He accepts the risks and agrees to proceed.  He has been on Plavix for a right leg stent. We need to wait 5-7 days for plavix to wash out of system prior to surgery. Pre operative carotid duplex US showed no significant internal carotid artery stenosis bilaterally. ABIs were 0.53 on the right and 0.68 on left. He underwent a CABG x 5 on 10/18/2015.  Brief Hospital Course:  The patient was extubated the evening of surgery without difficulty. He remained afebrile and hemodynamically stable. [name], a line, chest tubes, and foley were removed early in the post operative course. Lopressor was started and titrated accordingly. He was restarted on Plavix for previous peripheral stent placement. He was volume over loaded and diuresed. He had ABL anemia. He did not require a post  op transfusion. His last H and H was 9.1 and 27.4. He was weaned off the insulin drip. The patient's HGA1C pre op was 7.9. Once he was tolerating a diet, home diabetic medicines (Metfromin and Glipizide) were restarted. The patient's glucose remained well controlled. The patient was felt surgically stable for transfer from the ICU to PCTU for further convalescence on 10/20/2015. He continues to progress with cardiac rehab. He was ambulating on 2 liters of oxygen. We will wean him to room air. He has been tolerating a diet and has had a bowel movement. Epicardial pacing wires were removed on 03/10. Chest tube sutures will be removed in the office after discharge. The patient is felt surgically stable for discharge today.   Latest Vital Signs: Blood pressure 118/66, pulse 81, temperature 98 F (36.7 C), temperature source Oral, resp. rate 19, height 5\' 7"  (1.702 m), weight 149 lb 14.6 oz (68 kg), SpO2 93 %.  Physical Exam: Cardiovascular: RRR Pulmonary: Diminished at basesbilaterally; no rales, wheezes, or rhonchi. Abdomen: Soft, non tender, bowel sounds present. Extremities: Trace bilateral lower extremity edema. Wounds: Clean and dry. No erythema or signs of infection.  Discharge Condition:Stable and discharge to home  Recent laboratory studies:  Lab Results  Component Value Date   WBC 13.5* 10/21/2015   HGB 9.1* 10/21/2015   HCT 27.4* 10/21/2015   MCV 86.7 10/21/2015   PLT 303 10/21/2015   Lab Results  Component Value Date   NA 132* 10/21/2015   K 4.3 10/21/2015   CL 93* 10/21/2015   CO2 25 10/21/2015   CREATININE 1.10 10/21/2015   GLUCOSE 110* 10/21/2015    Diagnostic Studies: Dg Chest 2 View  10/21/2015  CLINICAL DATA:  Initial evaluation for chest soreness, status post recent CABG. EXAM: CHEST  2 VIEW COMPARISON:  Prior radiograph from 10/20/2015. FINDINGS: Median sternotomy wires underlying CABG markers and surgical clips present. Cardiomegaly is stable. Mediastinal silhouette  within normal limits. Atheromatous plaque within the aortic arch. Right IJ sheath has been removed. Lungs are mildly hypoinflated. Linear bibasilar atelectatic changes, right greater than left. Changes are slightly progressed relative to previous, with atelectasis in the right lung base more platelike in appearance. No pulmonary edema. No consolidative airspace disease. Persistent small bilateral pleural effusions. No pneumothorax. Osseous structures unchanged. IMPRESSION: 1. Interval removal of right IJ sheath. 2. Slight interval worsening in bibasilar atelectasis, right slightly worse than left. 3. Persistent small bilateral pleural effusions. Electronically Signed   By: Jeannine Boga M.D.   On: 10/21/2015 06:49   Ct Chest Wo Contrast  10/13/2015  CLINICAL DATA:  On stable angina, followup recent chest x-ray EXAM: CT CHEST WITHOUT CONTRAST TECHNIQUE: Multidetector CT imaging of the chest was performed following the standard protocol without IV contrast. COMPARISON:  10/09/2015 FINDINGS: The lungs are well aerated bilaterally. The left lung is clear. The right lung demonstrates some scattered small nodules measuring less than 5 mm. In the right upper lobe however there is a 2.2 cm partially spiculated/partially cavitary lesion identified. This was not present on a prior CT from 20/11. These changes are highly suspicious for underlying neoplasm.  The thoracic inlet is within normal limits. The thoracic aorta shows calcification without aneurysmal dilatation. No significant hilar or mediastinal adenopathy is noted. Scanning into the upper abdomen reveals no acute abnormality. The osseous structures are within normal limits. IMPRESSION: Spiculated and cavitary lesion in the right upper lobe as described. PET-CT and/or tissue sampling is recommended. Scattered small nodules within the right lobe all measuring less than 5 mm. If the patient is at high risk for bronchogenic carcinoma, follow-up chest CT at 1 year  is recommended. If the patient is at low risk, no follow-up is needed. This recommendation follows the consensus statement: Guidelines for Management of Small Pulmonary Nodules Detected on CT Scans: A Statement from the [employer] as published in Radiology 2005; 237:395-400. Electronically Signed   By: Inez Catalina M.D.   On: 10/13/2015 07:56   Discharge Medications:   Medication List    STOP taking these medications        levofloxacin 500 MG tablet  Commonly known as:  LEVAQUIN      TAKE these medications        amLODipine 10 MG tablet  Commonly known as:  NORVASC  Take 0.5 tablets (5 mg total) by mouth daily.     aspirin 81 MG tablet  Take 81 mg by mouth daily.     clopidogrel 75 MG tablet  Commonly known as:  PLAVIX  Take 75 mg by mouth daily with breakfast.     ferrous sulfate 325 (65 FE) MG tablet  Take 325-650 mg by mouth 2 (two) times daily with a meal. 650mg  in the morning and 325mg  in the evening     gabapentin 300 MG capsule  Commonly known as:  NEURONTIN  Take 300 mg by mouth 3 (three) times daily.     glipiZIDE 5 MG tablet  Commonly known as:  GLUCOTROL  Take 10 mg by mouth 2 (two) times daily before a meal.     hydrochlorothiazide 25 MG tablet  Commonly known as:  HYDRODIURIL  Take 25 mg by mouth daily.     lisinopril 5 MG tablet  Commonly known as:  PRINIVIL,ZESTRIL  Take 1 tablet (5 mg total) by mouth daily.     metFORMIN 1000 MG tablet  Commonly known as:  GLUCOPHAGE  Take 1,000 mg by mouth 2 (two) times daily with a meal.     metoprolol tartrate 25 MG tablet  Commonly known as:  LOPRESSOR  Take 1 tablet (25 mg total) by mouth 2 (two) times daily.     oxyCODONE 5 MG immediate release tablet  Commonly known as:  Oxy IR/ROXICODONE  Take 1-2 tablets (5-10 mg total) by mouth every 4 (four) hours as needed for severe pain.     pantoprazole 40 MG tablet  Commonly known as:  PROTONIX  Take 40 mg by mouth daily.     simvastatin 40 MG tablet   Commonly known as:  ZOCOR  Take 20 mg by mouth at bedtime.     Vitamin D 2000 units tablet  Take 2,000 Units by mouth daily.       The patient has been discharged on:   1.Beta Blocker:  Yes [ x  ]                              No   [   ]  If No, reason:  2.Ace Inhibitor/ARB: Yes [ x  ]                                     No  [    ]                                     If No, reason:  3.Statin:   Yes [  x ]                  No  [   ]                  If No, reason:  4.Ecasa:  Yes  [  x ]                  No   [   ]                  If No, reason:  Follow Up Appointments: Follow-up Information    Follow up with [name], MD On 11/16/2015.   Specialty:  Cardiothoracic Surgery   Why:  PA/LAT CXR to be taken (at [facility] which is in the same building as Dr. Leonarda Salon office) on 11/16/2015 at 9:45 am;Appointment time is at 10:30 am   Contact information:   [place] [address] [phone]       Follow up with Erlene Quan, PA-C On 11/05/2015.   Specialties:  Cardiology, Radiology   Why:  Appointment time is at 2:30 pm   Contact information:   [address] [place] [address] [phone]       Follow up with Medical Doctor.   Why:  Obtain a medical doctor for furrther diabetes management and further surveillance of HGA1C 7.9      Follow up with Nurse On 10/29/2015.   Why:  Appointment is with nurse only to have chest tube sutures removed. Appointment time is at 11:00 am   Contact information:   [place] [address] [phone]      Signed: Lars Pinks MPA-C 10/22/2015, 10:40 AM

## 2015-10-21 NOTE — Progress Notes (Signed)
1110 Pt ambulated 550 with a walker with a steady gait.  Pt ambulated this distance without oxygen and maintaining a SP02 of greater than 97% on RA.  Per cardiac Rehab RN pt taken of Oxygen therapy

## 2015-10-21 NOTE — Progress Notes (Signed)
1436 Noted pt walked with SN earlier and did well on RA. Was coming to walk again but pt had n/v after lunch and resting now. Will let staff walk later and follow up tomorrow.  Graylon Good RN BSN 10/21/2015 2:42 PM

## 2015-10-21 NOTE — Progress Notes (Signed)
1030 Came to see pt to walk. Pt stated he did not sleep well last night and he was told he could rest until twelve and then was going to walk. Encouraged pt to have nurse walk on RA to check his sats walking. Sats were 95% on 2L in bed. Will followup if time permits. Graylon Good RN BSN 10/21/2015 10:31 AM

## 2015-10-21 NOTE — Progress Notes (Addendum)
      [address]       [place] [address]             [phone]        3 Days Post-Op Procedure(s) (LRB): CORONARY ARTERY BYPASS GRAFTING (CABG) x  five, using left internal mammary artery and right leg greater saphenous vein harvested endoscopically (N/A) TRANSESOPHAGEAL ECHOCARDIOGRAM (TEE) (N/A)  Subjective: Patient passing flatus but no bowel movement yet  Objective: Vital signs in last 24 hours: Temp:  [97.6 F (36.4 C)-98.6 F (37 C)] 97.6 F (36.4 C) (03/09 0446) Pulse Rate:  [78-106] 86 (03/09 0446) Cardiac Rhythm:  [-] Normal sinus rhythm (03/08 1900) Resp:  [17-26] 18 (03/09 0446) BP: (112-167)/(54-88) 164/78 mmHg (03/09 0446) SpO2:  [90 %-99 %] 95 % (03/09 0446) Weight:  [154 lb 5.2 oz (70 kg)] 154 lb 5.2 oz (70 kg) (03/09 0446)  Pre op weight 68 kg Current Weight  10/21/15 154 lb 5.2 oz (70 kg)      Intake/Output from previous day: 03/08 0701 - 03/09 0700 In: 540 [P.O.:480; I.V.:60] Out: 900 [Urine:900]   Physical Exam:  Cardiovascular: RRR Pulmonary: Diminished at basesbilaterally; no rales, wheezes, or rhonchi. Abdomen: Soft, non tender, bowel sounds present. Extremities: Trace bilateral lower extremity edema. Wounds: Clean and dry.  No erythema or signs of infection.  Lab Results: CBC: Recent Labs  10/20/15 0347 10/21/15 0215  WBC 12.3* 13.5*  HGB 7.9* 9.1*  HCT 25.3* 27.4*  PLT 271 303   BMET:  Recent Labs  10/20/15 0347 10/21/15 0215  NA 137 132*  K 4.8 4.3  CL 102 93*  CO2 26 25  GLUCOSE 79 110*  BUN 14 16  CREATININE 1.13 1.10  CALCIUM 8.6* 8.9    PT/INR:  Lab Results  Component Value Date   INR 1.59* 10/18/2015   INR 1.07 10/09/2015   INR 1.0 06/24/2007   ABG:  INR: Will add last result for INR, ABG once components are confirmed Will add last 4 CBG results once components are confirmed  Assessment/Plan:  1. CV - Hypertensive. Maintaining SR in the 80's. On Lopressor 12.5 mg bid and HCTZ  25 mg daily. Increase Lopressor to 25 mg bid.  2.  Pulmonary - On 2 liters of oxygen via [place]. Wean to room air as tolerates. CXR this am shows bibasilar atelectasis L>R and small bilateral pleural effusions. Encourage incentive spirometer and flutter valve 3.  Acute blood loss anemia - H and H up to 9.1 and 27.4. Continue ferrous. 4. Hyponatremia-sodium 132. May be related to diuresis 5. DM-CBGs 120/142/134. On Glipizide 10 mg bid and Metformin 1000 mg bid. Pre op HGA1C 7.9. Will need follow up with medical doctor after discharge. 6. Remove EPW in am 7. Possible discharge in 1-2 days  ZIMMERMAN,DONIELLE MPA-C 10/21/2015,8:44 AM  Patient seen and examined, agree with above  Remo Lipps C. Roxan Hockey, MD [facility] [phone]

## 2015-10-21 NOTE — Discharge Instructions (Signed)
° ° °Activity: 1.May walk up steps °               2.No lifting more than ten pounds for four weeks.  °               3.No driving for four weeks. °               4.Stop any activity that causes chest pain, shortness of breath, dizziness, sweating or excessive weakness. °               5.Avoid straining. °               6.Continue with your breathing exercises daily. ° °Diet: Diabetic diet and Low fat, Low salt diet ° °Wound Care: May shower.  Clean wounds with mild soap and water daily. Contact the office at [phone] if any problems arise. ° °Coronary Artery Bypass Grafting, Care After °Refer to this sheet in the next few weeks. These instructions provide you with information on caring for yourself after your procedure. Your health care provider may also give you more specific instructions. Your treatment has been planned according to current medical practices, but problems sometimes occur. Call your health care provider if you have any problems or questions after your procedure. °WHAT TO EXPECT AFTER THE PROCEDURE °Recovery from surgery will be different for everyone. Some people feel well after 3 or 4 weeks, while for others it takes longer. After your procedure, it is typical to have the following: °· Nausea and a lack of appetite.   °· Constipation. °· Weakness and fatigue.   °· Depression or irritability.   °· Pain or discomfort at your incision site. °HOME CARE INSTRUCTIONS °· Take medicines only as directed by your health care provider. Do not stop taking medicines or start any new medicines without first checking with your health care provider. °· Take your pulse as directed by your health care provider. °· Perform deep breathing as directed by your health care provider. If you were given a device called an incentive spirometer, use it to practice deep breathing several times a day. Support your chest with a pillow or your arms when you take deep breaths or cough. °· Keep incision areas clean, dry, and  protected. Remove or change any bandages (dressings) only as directed by your health care provider. You may have skin adhesive strips over the incision areas. Do not take the strips off. They will fall off on their own. °· Check incision areas daily for any swelling, redness, or drainage. °· If incisions were made in your legs, do the following: °¨ Avoid crossing your legs.   °¨ Avoid sitting for long periods of time. Change positions every 30 minutes.   °¨ Elevate your legs when you are sitting. °· Wear compression stockings as directed by your health care provider. These stockings help keep blood clots from forming in your legs. °· Take showers once your health care provider approves. Until then, only take sponge baths. Pat incisions dry. Do not rub incisions with a washcloth or towel. Do not take baths, swim, or use a hot tub until your health care provider approves. °· Eat foods that are high in fiber, such as raw fruits and vegetables, whole grains, beans, and nuts. Meats should be lean cut. Avoid canned, processed, and fried foods. °· Drink enough fluid to keep your urine clear or pale yellow. °· Weigh yourself every day. This helps identify if you are retaining fluid that may make your heart   and lungs work harder. °· Rest and limit activity as directed by your health care provider. You may be instructed to: °¨ Stop any activity at once if you have chest pain, shortness of breath, irregular heartbeats, or dizziness. Get help right away if you have any of these symptoms. °¨ Move around frequently for short periods or take short walks as directed by your health care provider. Increase your activities gradually. You may need physical therapy or cardiac rehabilitation to help strengthen your muscles and build your endurance. °¨ Avoid lifting, pushing, or pulling anything heavier than 10 lb (4.5 kg) for at least 6 weeks after surgery. °· Do not drive until your health care provider approves.  °· Ask your health  care provider when you may return to work. °· Ask your health care provider when you may resume sexual activity. °· Keep all follow-up visits as directed by your health care provider. This is important. °SEEK MEDICAL CARE IF: °· You have swelling, redness, increasing pain, or drainage at the site of an incision. °· You have a fever. °· You have swelling in your ankles or legs. °· You have pain in your legs.   °· You gain 2 or more pounds (0.9 kg) a day. °· You are nauseous or vomit. °· You have diarrhea.  °SEEK IMMEDIATE MEDICAL CARE IF: °· You have chest pain that goes to your jaw or arms. °· You have shortness of breath.   °· You have a fast or irregular heartbeat.   °· You notice a "clicking" in your breastbone (sternum) when you move.   °· You have numbness or weakness in your arms or legs. °· You feel dizzy or light-headed.   °MAKE SURE YOU: °· Understand these instructions. °· Will watch your condition. °· Will get help right away if you are not doing well or get worse. °  °This information is not intended to replace advice given to you by your health care provider. Make sure you discuss any questions you have with your health care provider. °  °Document Released: 02/17/2005 Document Revised: 08/21/2014 Document Reviewed: 01/07/2013 °Elsevier Interactive Patient Education ©2016 [employer] ° °

## 2015-10-22 LAB — GLUCOSE, CAPILLARY
Glucose-Capillary: 114 mg/dL — ABNORMAL HIGH (ref 65–99)
Glucose-Capillary: 178 mg/dL — ABNORMAL HIGH (ref 65–99)

## 2015-10-22 MED ORDER — LISINOPRIL 5 MG PO TABS: 5.0000 mg | ORAL_TABLET | Freq: Every day | ORAL | Status: DC

## 2015-10-22 MED ORDER — OXYCODONE HCL 5 MG PO TABS: 5.0000 mg | ORAL_TABLET | ORAL | Status: DC | PRN

## 2015-10-22 MED ORDER — LACTULOSE 10 GM/15ML PO SOLN
20.0000 g | Freq: Once | ORAL | Status: AC
  Administered 2015-10-22: 20 g via ORAL
  Filled 2015-10-22: qty 30

## 2015-10-22 MED ORDER — METOPROLOL TARTRATE 25 MG PO TABS: 25.0000 mg | ORAL_TABLET | Freq: Two times a day (BID) | ORAL | Status: DC

## 2015-10-22 MED ORDER — AMLODIPINE BESYLATE 10 MG PO TABS: 5.0000 mg | ORAL_TABLET | Freq: Every day | ORAL | Status: DC

## 2015-10-22 MED ORDER — AMLODIPINE BESYLATE 5 MG PO TABS: 5.0000 mg | ORAL_TABLET | Freq: Every day | ORAL | Status: DC

## 2015-10-22 NOTE — Progress Notes (Signed)
[id number] Education completed with pt and wife who voiced understanding. Encouraged IS and flutter valve. Gave heart healthy and diabetic diets. Discussed carb counting. Discussed CRP 2 and referring to [facility] program. Put on discharge video for them to view. Graylon Good RN BSN 10/22/2015 11:59 AM

## 2015-10-22 NOTE — Progress Notes (Addendum)
      [address]       [place] [address]             [phone]        4 Days Post-Op Procedure(s) (LRB): CORONARY ARTERY BYPASS GRAFTING (CABG) x  five, using left internal mammary artery and right leg greater saphenous vein harvested endoscopically (N/A) TRANSESOPHAGEAL ECHOCARDIOGRAM (TEE) (N/A)  Subjective: Patient states had small bowel movement this am. No  complaints.  Objective: Vital signs in last 24 hours: Temp:  [98 F (36.7 C)-98.3 F (36.8 C)] 98 F (36.7 C) (03/10 0607) Pulse Rate:  [81-92] 81 (03/10 0607) Cardiac Rhythm:  [-] Normal sinus rhythm (03/09 1900) Resp:  [18-20] 19 (03/10 0607) BP: (118-158)/(66-82) 118/66 mmHg (03/10 0607) SpO2:  [93 %-95 %] 93 % (03/10 0607) Weight:  [149 lb 14.6 oz (68 kg)] 149 lb 14.6 oz (68 kg) (03/10 0607)  Pre op weight 68 kg Current Weight  10/22/15 149 lb 14.6 oz (68 kg)      Intake/Output from previous day: 03/09 0701 - 03/10 0700 In: 240 [P.O.:240] Out: 650 [Urine:650]   Physical Exam:  Cardiovascular: RRR Pulmonary: Mostly clear; no rales, wheezes, or rhonchi. Abdomen: Soft, non tender, bowel sounds present. Extremities: No lower extremity edema. Wounds: Clean and dry.  No erythema or signs of infection.  Lab Results: CBC:  Recent Labs  10/20/15 0347 10/21/15 0215  WBC 12.3* 13.5*  HGB 7.9* 9.1*  HCT 25.3* 27.4*  PLT 271 303   BMET:   Recent Labs  10/20/15 0347 10/21/15 0215  NA 137 132*  K 4.8 4.3  CL 102 93*  CO2 26 25  GLUCOSE 79 110*  BUN 14 16  CREATININE 1.13 1.10  CALCIUM 8.6* 8.9    PT/INR:  Lab Results  Component Value Date   INR 1.59* 10/18/2015   INR 1.07 10/09/2015   INR 1.0 06/24/2007   ABG:  INR: Will add last result for INR, ABG once components are confirmed Will add last 4 CBG results once components are confirmed  Assessment/Plan:  1. CV - Hypertensive. Maintaining SR in the 80's. On Lopressor 25 mg bid, Norvasc 10 mg daily, and HCTZ  25 mg daily. Will restart low dose Lisinopril and decrease Norvasc to 5 mg daily. 2.  Pulmonary - On room air. Encourage incentive spirometer and flutter valve 3.  Acute blood loss anemia - H and H up to 9.1 and 27.4. Continue ferrous. 4. Hyponatremia-sodium 132. May be related to diuresis 5. DM-CBGs 113/127/114. On Glipizide 10 mg bid and Metformin 1000 mg bid. Pre op HGA1C 7.9. Will need follow up with medical doctor after discharge. 6. Remove EPW  7. Chest tube sutures to remain 8. As discussed with Dr. Roxan Hockey, discharge home  ZIMMERMAN,DONIELLE MPA-C 10/22/2015,7:36 AM  Patient seen and examined, agree with above Home today  Remo Lipps C. Roxan Hockey, MD [facility] [phone]

## 2015-10-22 NOTE — Care Management Note (Signed)
Case Management Note  Patient Details  Name: [name] MRN: [id number] Date of Birth: October 06, 1945  Subjective/Objective:     CABG               Action/Plan: Discharge Planning: AVS reviewed NCM spoke to pt and wife at bedside. Pt states he receives his medications from the [facility], his PCP is at the [place]. Explained to wife the [place] can assist with him getting a shower chair. [facility]. He has RW at home. [place] and CM will follow up with pt to arrange appt with PCP. Requested dc summary be faxed to [facility] fax # [phone].   Expected Discharge Date:  3/102/2017               Expected Discharge Plan:  Home/Self Care  In-House Referral:  Clinical Social Work  Discharge planning Services  CM Consult  Post Acute Care Choice:  NA Choice offered to:  NA  DME Arranged:  N/A DME Agency:  NA  HH Arranged:  NA HH Agency:  NA  Status of Service:  Completed, signed off  Medicare Important Message Given:  yes Date Medicare IM Given:    Medicare IM give by:    Date Additional Medicare IM Given:    Additional Medicare Important Message give by:     If discussed at [place] of Stay Meetings, dates discussed:    Additional Comments:  Erenest Rasher, RN 10/22/2015, 1:36 PM

## 2015-10-22 NOTE — Care Management Important Message (Signed)
Important Message  Patient Details  Name: SYE AUER MRN: [id number] Date of Birth: 07-08-1946   Medicare Important Message Given:  Yes    Erenest Rasher, RN 10/22/2015, 1:40 PM

## 2015-10-22 NOTE — Progress Notes (Signed)
At 11:00 pulled epicardial pacing wires. 2 sutures removed. Wires were intact. Patient tolerated well. Vitals Stable, no complaints of pain. Sites cleansed. Bed rest for one hour. Will continue to monitor.  Cyndia Bent

## 2015-10-29 ENCOUNTER — Ambulatory Visit: Payer: Self-pay

## 2015-10-29 ENCOUNTER — Ambulatory Visit (INDEPENDENT_AMBULATORY_CARE_PROVIDER_SITE_OTHER): Payer: Self-pay

## 2015-10-29 DIAGNOSIS — Z4802 Encounter for removal of sutures: Secondary | ICD-10-CM

## 2015-10-29 DIAGNOSIS — Z951 Presence of aortocoronary bypass graft: Secondary | ICD-10-CM

## 2015-10-29 NOTE — Progress Notes (Unsigned)
Removed 2 sutures from Chest tube sites with no signs of infection and patient tolerated well.

## 2015-11-05 ENCOUNTER — Ambulatory Visit (INDEPENDENT_AMBULATORY_CARE_PROVIDER_SITE_OTHER): Payer: Medicare Other | Admitting: Cardiology

## 2015-11-05 ENCOUNTER — Encounter: Payer: Self-pay | Admitting: Cardiology

## 2015-11-05 VITALS — BP 130/56 | HR 107 | Ht 67.0 in | Wt 154.2 lb

## 2015-11-05 DIAGNOSIS — I10 Essential (primary) hypertension: Secondary | ICD-10-CM | POA: Diagnosis not present

## 2015-11-05 DIAGNOSIS — I739 Peripheral vascular disease, unspecified: Secondary | ICD-10-CM | POA: Diagnosis not present

## 2015-11-05 DIAGNOSIS — E1122 Type 2 diabetes mellitus with diabetic chronic kidney disease: Secondary | ICD-10-CM | POA: Diagnosis not present

## 2015-11-05 DIAGNOSIS — Z951 Presence of aortocoronary bypass graft: Secondary | ICD-10-CM | POA: Diagnosis not present

## 2015-11-05 DIAGNOSIS — N181 Chronic kidney disease, stage 1: Secondary | ICD-10-CM

## 2015-11-05 NOTE — Progress Notes (Signed)
11/05/2015 [name]   Jan 10, 1946  [id number]  Primary Physician No primary care provider on file. Primary Cardiologist: Dr Gwenlyn Found  HPI:  70 y.o. male with PMH of COPD (quit smoking years ago), hypertension, hyperlipidemia, type 2 diabetes mellitus, CAD with stent (2003), and PAD with remote PV stenting and femoral-popliteal bypass graft by Dr Amedeo Plenty in the 90s. He was admitted 10/09/15 with chest pressure. There was concern he had CAP as well. Cath 10/11/15 revealed sever 3 V CAD. He did have transient renal insufficiency. He underwent CABG x 5 on 10/18/15. He was discharged 10/21/15 and seen today in the office as a post hospital follow up. On reviewing the records it appears his post op course was unremarkable except for post op anemia requiring transfusion. The pt says he has done well since DC. Ambulating without problems, some trouble sleeping but otherwise OK.    Current Outpatient Prescriptions  Medication Sig Dispense Refill  . amLODipine (NORVASC) 10 MG tablet Take 0.5 tablets (5 mg total) by mouth daily. 30 tablet 1  . aspirin 81 MG tablet Take 81 mg by mouth daily.    . Cholecalciferol (VITAMIN D) 2000 UNITS tablet Take 2,000 Units by mouth daily.    . clopidogrel (PLAVIX) 75 MG tablet Take 75 mg by mouth daily with breakfast.    . ferrous sulfate 325 (65 FE) MG tablet Take 325-650 mg by mouth 2 (two) times daily with a meal. 650mg  in the morning and 325mg  in the evening    . gabapentin (NEURONTIN) 300 MG capsule Take 300 mg by mouth 3 (three) times daily.     [name] glipiZIDE (GLUCOTROL) 5 MG tablet Take 10 mg by mouth 2 (two) times daily before a meal.     . hydrochlorothiazide (HYDRODIURIL) 25 MG tablet Take 25 mg by mouth daily.    [name] lisinopril (PRINIVIL,ZESTRIL) 5 MG tablet Take 1 tablet (5 mg total) by mouth daily. 30 tablet 1  . metFORMIN (GLUCOPHAGE) 1000 MG tablet Take 1,000 mg by mouth 2 (two) times daily with a meal.    . metoprolol tartrate (LOPRESSOR) 25 MG tablet Take 1  tablet (25 mg total) by mouth 2 (two) times daily. 60 tablet 1  . oxyCODONE (OXY IR/ROXICODONE) 5 MG immediate release tablet Take 1-2 tablets (5-10 mg total) by mouth every 4 (four) hours as needed for severe pain. 30 tablet 0  . pantoprazole (PROTONIX) 40 MG tablet Take 40 mg by mouth daily.    . simvastatin (ZOCOR) 40 MG tablet Take 20 mg by mouth at bedtime.      No current facility-administered medications for this visit.    No Known Allergies  Social History   Social History  . Marital Status: Married    Spouse Name: N/A  . Number of Children: N/A  . Years of Education: N/A   Occupational History  . Not on file.   Social History Main Topics  . Smoking status: Former Smoker -- 2.00 packs/day for 30 years    Types: Cigarettes  . Smokeless tobacco: Not on file  . Alcohol Use: No  . Drug Use: No  . Sexual Activity: Not on file   Other Topics Concern  . Not on file   Social History Narrative     Review of Systems: General: negative for chills, fever, night sweats or weight changes.  Cardiovascular: negative for chest pain, dyspnea on exertion, edema, orthopnea, palpitations, paroxysmal nocturnal dyspnea or shortness of breath Dermatological: negative for rash Respiratory:  negative for cough or wheezing Urologic: negative for hematuria Abdominal: negative for nausea, vomiting, diarrhea, bright red blood per rectum, melena, or hematemesis Neurologic: negative for visual changes, syncope, or dizziness All other systems reviewed and are otherwise negative except as noted above.    Blood pressure 130/56, pulse 107, height 5\' 7"  (1.702 m), weight 154 lb 3.2 oz (69.945 kg).  General appearance: alert, cooperative and no distress Neck: no JVD Lungs: clear to auscultation bilaterally Heart: regular rate and rhythm Extremities: no edema Neurologic: Grossly normal  EKG NSR, ST, NSST changes  ASSESSMENT AND PLAN:   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 LIMA- LAD, SVG-Dx,  SVG-OM1/OM2, SVG -RCA  Type 2 diabetes mellitus (HCC) Type 2 NIDDM  Essential hypertension Controlled  PVD (peripheral vascular disease) (HCC) Hx of L-R FFBPG by Dr Amedeo Plenty (1990s)   PLAN  His HR is a little fast but its still a little early post op to be adjusting his medications. He has a f/u with Dr Roxan Hockey 11/16/15 and will see Dr Ellyn Hack in 3 months- check lipids and LFTs then.   Kerin Ransom K PA-C 11/05/2015 2:57 PM

## 2015-11-05 NOTE — Assessment & Plan Note (Signed)
LIMA- LAD, SVG-Dx, SVG-OM1/OM2, SVG -RCA

## 2015-11-05 NOTE — Assessment & Plan Note (Signed)
Hx of L-R FFBPG by Dr Amedeo Plenty (1990s)

## 2015-11-05 NOTE — Assessment & Plan Note (Signed)
Type 2 NIDDM 

## 2015-11-05 NOTE — Patient Instructions (Signed)
Continue same medications   Your physician recommends that you schedule a follow-up appointment with Dr.Berry   Tuesday 02/08/16 at 1:30 pm.

## 2015-11-05 NOTE — Assessment & Plan Note (Signed)
Controlled.  

## 2015-11-15 ENCOUNTER — Other Ambulatory Visit: Payer: Self-pay | Admitting: Thoracic Surgery (Cardiothoracic Vascular Surgery)

## 2015-11-15 DIAGNOSIS — Z951 Presence of aortocoronary bypass graft: Secondary | ICD-10-CM

## 2015-11-16 ENCOUNTER — Ambulatory Visit
Admission: RE | Admit: 2015-11-16 | Discharge: 2015-11-16 | Disposition: A | Discharge status: Home / Self Care | Payer: Medicare Other | Source: Ambulatory Visit | Attending: Thoracic Surgery (Cardiothoracic Vascular Surgery) | Admitting: Thoracic Surgery (Cardiothoracic Vascular Surgery)

## 2015-11-16 ENCOUNTER — Ambulatory Visit (INDEPENDENT_AMBULATORY_CARE_PROVIDER_SITE_OTHER): Payer: Self-pay | Admitting: Thoracic Surgery (Cardiothoracic Vascular Surgery)

## 2015-11-16 ENCOUNTER — Encounter: Payer: Self-pay | Admitting: Thoracic Surgery (Cardiothoracic Vascular Surgery)

## 2015-11-16 ENCOUNTER — Other Ambulatory Visit: Payer: Self-pay | Admitting: *Deleted

## 2015-11-16 VITALS — BP 113/71 | HR 99 | Resp 16 | Ht 67.0 in | Wt 153.0 lb

## 2015-11-16 DIAGNOSIS — Z951 Presence of aortocoronary bypass graft: Secondary | ICD-10-CM

## 2015-11-16 DIAGNOSIS — R911 Solitary pulmonary nodule: Secondary | ICD-10-CM

## 2015-11-16 NOTE — Progress Notes (Signed)
[address]       [place] [address]             [phone]       HPI: [name] returns for a scheduled postoperative follow-up visit.  He is a 70 year old man who had coronary artery bypass grafting on 10/18/2015. His postoperative course was uncomplicated and he went home on postoperative day #4. Since discharge she has been feeling well. He has not had any anginal pain. He is walking on a regular basis. He has only taken 3 pain pills since discharge. He is anxious to increase his activities.  Another notable finding during his admission was a right upper lobe nodule concerning for possible primary bronchogenic carcinoma. Workup of that was deferred until after his recovery from surgery.  Past Medical History  Diagnosis Date  . Hypertension   . Hypercholesteremia   . GERD (gastroesophageal reflux disease)   . Diabetes mellitus without complication ([place])   . Neuropathy ([place])   . Peripheral vascular disease ([place])     s/p R-L FEM-FEM BYPASS  . CAD S/P percutaneous coronary angioplasty 1998    PCI TO CX  . Hx of CABG March 2017    x 5        Current Outpatient Prescriptions  Medication Sig Dispense Refill  . amLODipine (NORVASC) 10 MG tablet Take 0.5 tablets (5 mg total) by mouth daily. 30 tablet 1  . aspirin 81 MG tablet Take 81 mg by mouth daily.    . Cholecalciferol (VITAMIN D) 2000 UNITS tablet Take 2,000 Units by mouth daily.    . clopidogrel (PLAVIX) 75 MG tablet Take 75 mg by mouth daily with breakfast.    . ferrous sulfate 325 (65 FE) MG tablet Take 325-650 mg by mouth 2 (two) times daily with a meal. 650mg  in the morning and 325mg  in the evening    . gabapentin (NEURONTIN) 300 MG capsule Take 300 mg by mouth 3 (three) times daily.     [name] glipiZIDE (GLUCOTROL) 5 MG tablet Take 10 mg by mouth 2 (two) times daily before a meal.     . hydrochlorothiazide (HYDRODIURIL) 25 MG tablet Take 25 mg by mouth daily.    [name] lisinopril (PRINIVIL,ZESTRIL) 5 MG  tablet Take 1 tablet (5 mg total) by mouth daily. 30 tablet 1  . metFORMIN (GLUCOPHAGE) 1000 MG tablet Take 1,000 mg by mouth 2 (two) times daily with a meal.    . metoprolol tartrate (LOPRESSOR) 25 MG tablet Take 1 tablet (25 mg total) by mouth 2 (two) times daily. 60 tablet 1  . pantoprazole (PROTONIX) 40 MG tablet Take 40 mg by mouth daily.    . simvastatin (ZOCOR) 40 MG tablet Take 20 mg by mouth at bedtime.     [name] oxyCODONE (OXY IR/ROXICODONE) 5 MG immediate release tablet Take 1-2 tablets (5-10 mg total) by mouth every 4 (four) hours as needed for severe pain. (Patient not taking: Reported on 11/16/2015) 30 tablet 0   No current facility-administered medications for this visit.    Physical Exam BP 113/71 mmHg  Pulse 99  Resp 16  Ht 5\' 7"  (1.702 m)  Wt 153 lb (69.4 kg)  BMI 23.96 kg/m2  SpO26 25% 70 year old man in no acute distress Alert and oriented 3 with no focal deficits Cardiac regular rate and rhythm normal S1 and S2 Lungs clear with equal breath size bilaterally Sternum stable, incision healing well Leg incision with small seroma, no peripheral  edema  Diagnostic Tests: I personally reviewed his chest x-ray shows no effusions or infiltrates. There is no change in the right upper lobe nodule.  Impression: 70 year old man who is now about a month out from coronary bypass grafting 5. He's doing well at the present time. He is having minimal pain and has only taken after a few pain pills since discharge. He does have some generalized soreness. That is to be expected.  His exercise tolerance is good and continues to improve.  He may begin driving on a limited basis. Appropriate precautions were discussed.  He should not lift anything over 10 pounds for another 2 weeks.  Regarding his right upper lobe nodule, this is worrisome for a lung cancer. I recommended that we do a PET/CT to further evaluate the nodule. We'll also see if we can get the CT he had done in March  reformatted for super dimension protocol for possible navigational bronchoscopy.  I will see him back in about 3 weeks after his PET/CT to further discuss management of the lung nodule.  He is not smoking.    [name], MD [facility] [phone]

## 2015-11-18 ENCOUNTER — Telehealth: Payer: Self-pay | Admitting: Cardiovascular Disease

## 2015-11-18 NOTE — Telephone Encounter (Signed)
Spoke with pt daughter, okay per DPR, pt needing to adjust Cardiac Rehab scheduled r/t to cost of $40 / vist and needing to go 3 times a week. Instructed that he go to Cardiac Rehab and see if they have any suggestions on things he can do at home after a few sessions based off of his progress when he is at therapy. Pt also need to start as soon as approved and not wait and it is essential to his recovery. She verbalized understanding, no additional questions at this time.

## 2015-11-18 NOTE — Telephone Encounter (Signed)
New message      Pt had CABG October 18, 2015.  He cannot afford cardiac rehab which is going to cost him 120.00 a week (going 3 times a week).  Is there any thing else he can do or can he go to cardiac rehab 1 time a week?  Please call

## 2015-11-30 ENCOUNTER — Encounter (HOSPITAL_COMMUNITY)
Admission: RE | Admit: 2015-11-30 | Discharge: 2015-11-30 | Disposition: A | Discharge status: Home / Self Care | Payer: Medicare Other | Source: Ambulatory Visit | Attending: Thoracic Surgery (Cardiothoracic Vascular Surgery) | Admitting: Thoracic Surgery (Cardiothoracic Vascular Surgery)

## 2015-11-30 DIAGNOSIS — R911 Solitary pulmonary nodule: Secondary | ICD-10-CM | POA: Insufficient documentation

## 2015-11-30 LAB — GLUCOSE, CAPILLARY: Glucose-Capillary: 156 mg/dL — ABNORMAL HIGH (ref 65–99)

## 2015-11-30 MED ORDER — FLUDEOXYGLUCOSE F - 18 (FDG) INJECTION
7.5500 | Freq: Once | INTRAVENOUS | Status: AC | PRN
  Administered 2015-11-30: 7.55 via INTRAVENOUS

## 2015-12-07 ENCOUNTER — Ambulatory Visit (INDEPENDENT_AMBULATORY_CARE_PROVIDER_SITE_OTHER): Payer: Self-pay | Admitting: Thoracic Surgery (Cardiothoracic Vascular Surgery)

## 2015-12-07 ENCOUNTER — Encounter: Payer: Self-pay | Admitting: Thoracic Surgery (Cardiothoracic Vascular Surgery)

## 2015-12-07 VITALS — BP 145/84 | HR 96 | Resp 20 | Ht 67.0 in | Wt 153.0 lb

## 2015-12-07 DIAGNOSIS — Z951 Presence of aortocoronary bypass graft: Secondary | ICD-10-CM

## 2015-12-07 DIAGNOSIS — R911 Solitary pulmonary nodule: Secondary | ICD-10-CM

## 2015-12-07 NOTE — Progress Notes (Signed)
[address]       ,[place] [address]             [phone]       HPI: Mr. Arendall returns today to discuss results of his PET/CT.   He is a 70 year old man who had coronary artery bypass grafting on 10/18/2015. His postoperative course was uncomplicated and he went home on postoperative day #4. Since discharge he has been feeling well. He has not had any anginal pain. He is walking on a regular basis. He has only taken 3 pain pills since discharge. He is anxious to increase his activities.   He had a cavitary lesion noted in the right upper lobe on CT of the chest preoperatively. We did a PET/CT last week and he now returns to discuss the results of that.  Of note he has not smoked since his surgery.   Past Medical History  Diagnosis Date  . Hypertension   . Hypercholesteremia   . GERD (gastroesophageal reflux disease)   . Diabetes mellitus without complication ([place])   . Neuropathy ([place])   . Peripheral vascular disease ([place])     s/p R-L FEM-FEM BYPASS  . CAD S/P percutaneous coronary angioplasty 1998    PCI TO CX  . Hx of CABG March 2017    x 5      Current Outpatient Prescriptions  Medication Sig Dispense Refill  . amLODipine (NORVASC) 10 MG tablet Take 0.5 tablets (5 mg total) by mouth daily. 30 tablet 1  . aspirin 81 MG tablet Take 81 mg by mouth daily.    . Cholecalciferol (VITAMIN D) 2000 UNITS tablet Take 2,000 Units by mouth daily.    . clopidogrel (PLAVIX) 75 MG tablet Take 75 mg by mouth daily with breakfast.    . ferrous sulfate 325 (65 FE) MG tablet Take 325-650 mg by mouth 2 (two) times daily with a meal. 650mg  in the morning and 325mg  in the evening    . gabapentin (NEURONTIN) 300 MG capsule Take 300 mg by mouth 3 (three) times daily.     [name] glipiZIDE (GLUCOTROL) 5 MG tablet Take 10 mg by mouth 2 (two) times daily before a meal.     . hydrochlorothiazide (HYDRODIURIL) 25 MG tablet Take 25 mg by mouth daily.    [name] lisinopril  (PRINIVIL,ZESTRIL) 5 MG tablet Take 1 tablet (5 mg total) by mouth daily. 30 tablet 1  . metFORMIN (GLUCOPHAGE) 1000 MG tablet Take 1,000 mg by mouth 2 (two) times daily with a meal.    . metoprolol tartrate (LOPRESSOR) 25 MG tablet Take 1 tablet (25 mg total) by mouth 2 (two) times daily. 60 tablet 1  . oxyCODONE (OXY IR/ROXICODONE) 5 MG immediate release tablet Take 1-2 tablets (5-10 mg total) by mouth every 4 (four) hours as needed for severe pain. (Patient not taking: Reported on 11/16/2015) 30 tablet 0  . pantoprazole (PROTONIX) 40 MG tablet Take 40 mg by mouth daily.    . simvastatin (ZOCOR) 40 MG tablet Take 20 mg by mouth at bedtime.      No current facility-administered medications for this visit.    Physical Exam BP 145/84 mmHg  Pulse 96  Resp 20  Ht 5\' 7"  (1.702 m)  Wt 153 lb (69.4 kg)  BMI 23.96 kg/m2  SpO68 2% 70 year old man in no acute distress Alert and oriented 3 with no focal deficits Coarse breath sounds bilaterally, no wheezing Cardiac regular rate and rhythm normal  S1 and S2 Sternum stable, incision well-healed  Diagnostic Tests: NUCLEAR MEDICINE PET SKULL BASE TO THIGH  TECHNIQUE: 7.5 mCi F-18 FDG was injected intravenously. Full-ring PET imaging was performed from the skull base to thigh after the radiotracer. CT data was obtained and used for attenuation correction and anatomic localization.  FASTING BLOOD GLUCOSE: Value: 156 mg/dl  COMPARISON: Chest CT of 10/13/2015.  FINDINGS: NECK  No areas of abnormal hypermetabolism.  CHEST  Resolution of the previously described cavitary right upper lobe pulmonary nodule. Only mild peribronchovascular interstitial opacity remains in this area. No areas of abnormal hypermetabolism.  ABDOMEN/PELVIS  No areas of abnormal hypermetabolism.  SKELETON  No abnormal marrow activity.  CT IMAGES PERFORMED FOR ATTENUATION CORRECTION  Bilateral carotid atherosclerosis. Centrilobular emphysema.  Other small pulmonary nodules detailed previously are below PET resolution. Prior median sternotomy. Mild cardiomegaly.  Normal adrenal glands. Extensive renal vascular calcification bilaterally. Possible concurrent left renal collecting system punctate calculi.  Upper pole right renal cyst. Proximal gastric underdistention. Colonic stool burden suggests constipation. 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 There is mild surrounding interstitial thickening.  Mild prostatomegaly. Small right inguinal hernia contains fat. Status post fem-fem bypass.  IMPRESSION: 1. Interval resolution of the cavitary right upper lobe pulmonary nodule, consistent with an infectious process. 2. Atherosclerosis, including within the coronary arteries. 3. Possible constipation. 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 Mild surrounding interstitial thickening, for which diverticulitis cannot be excluded.   Electronically Signed  By: Abigail Miyamoto M.D.  On: 11/30/2015 10:34  I personally reviewed the PET/CT and compared to his previous CT scan. I concur with the findings as noted above.  Impression: 70 year old man is now about 6 weeks out from coronary bypass grafting. He is doing well from a surgical standpoint. He's not having any significant pain. He has started driving.  At this point there are no restrictions on his activities, but did advise him to use common sense as he becomes more active.  The suspicious finding in the right upper lobe on his preoperative CT has resolved. This is consistent with an infectious or inflammatory process. There is no evidence of cancer.  He has not smoked since his surgery. I congratulated him on that and emphasized the importance of continued abstinence.  He meets criteria for low dose CT screening for lung cancer with a  greater than 30-pack-year history of smoking, age 26, and only recently quit smoking. He goes to the [facility] for his primary care. I will plan to see him back in a year with a low-dose screening CT.  Plan: Return in one year with CT  [name], MD [facility] [phone]

## 2016-01-12 ENCOUNTER — Emergency Department (HOSPITAL_COMMUNITY): Payer: Medicare Other

## 2016-01-12 ENCOUNTER — Encounter (HOSPITAL_COMMUNITY): Payer: Self-pay | Admitting: Emergency Medicine

## 2016-01-12 ENCOUNTER — Telehealth: Payer: Self-pay | Admitting: Cardiovascular Disease

## 2016-01-12 ENCOUNTER — Inpatient Hospital Stay (HOSPITAL_COMMUNITY)
Admission: EM | Admit: 2016-01-12 | Discharge: 2016-01-14 | DRG: 871 | Disposition: A | Discharge status: Home / Self Care | Payer: Medicare Other | Attending: Internal Medicine | Admitting: Internal Medicine

## 2016-01-12 DIAGNOSIS — R0602 Shortness of breath: Secondary | ICD-10-CM | POA: Diagnosis not present

## 2016-01-12 DIAGNOSIS — I10 Essential (primary) hypertension: Secondary | ICD-10-CM | POA: Diagnosis present

## 2016-01-12 DIAGNOSIS — R000 Tachycardia, unspecified: Secondary | ICD-10-CM | POA: Diagnosis not present

## 2016-01-12 DIAGNOSIS — J181 Lobar pneumonia, unspecified organism: Secondary | ICD-10-CM | POA: Diagnosis present

## 2016-01-12 DIAGNOSIS — Y95 Nosocomial condition: Secondary | ICD-10-CM | POA: Diagnosis present

## 2016-01-12 DIAGNOSIS — K219 Gastro-esophageal reflux disease without esophagitis: Secondary | ICD-10-CM | POA: Diagnosis not present

## 2016-01-12 DIAGNOSIS — Z951 Presence of aortocoronary bypass graft: Secondary | ICD-10-CM

## 2016-01-12 DIAGNOSIS — E222 Syndrome of inappropriate secretion of antidiuretic hormone: Secondary | ICD-10-CM | POA: Diagnosis present

## 2016-01-12 DIAGNOSIS — Z87891 Personal history of nicotine dependence: Secondary | ICD-10-CM | POA: Diagnosis not present

## 2016-01-12 DIAGNOSIS — E119 Type 2 diabetes mellitus without complications: Secondary | ICD-10-CM

## 2016-01-12 DIAGNOSIS — E785 Hyperlipidemia, unspecified: Secondary | ICD-10-CM | POA: Diagnosis present

## 2016-01-12 DIAGNOSIS — I2510 Atherosclerotic heart disease of native coronary artery without angina pectoris: Secondary | ICD-10-CM | POA: Diagnosis present

## 2016-01-12 DIAGNOSIS — E871 Hypo-osmolality and hyponatremia: Secondary | ICD-10-CM | POA: Diagnosis present

## 2016-01-12 DIAGNOSIS — J189 Pneumonia, unspecified organism: Secondary | ICD-10-CM | POA: Diagnosis present

## 2016-01-12 DIAGNOSIS — A419 Sepsis, unspecified organism: Secondary | ICD-10-CM | POA: Diagnosis not present

## 2016-01-12 DIAGNOSIS — Z7902 Long term (current) use of antithrombotics/antiplatelets: Secondary | ICD-10-CM | POA: Diagnosis not present

## 2016-01-12 DIAGNOSIS — Z7984 Long term (current) use of oral hypoglycemic drugs: Secondary | ICD-10-CM

## 2016-01-12 DIAGNOSIS — E1140 Type 2 diabetes mellitus with diabetic neuropathy, unspecified: Secondary | ICD-10-CM | POA: Diagnosis present

## 2016-01-12 DIAGNOSIS — E1151 Type 2 diabetes mellitus with diabetic peripheral angiopathy without gangrene: Secondary | ICD-10-CM | POA: Diagnosis not present

## 2016-01-12 DIAGNOSIS — N179 Acute kidney failure, unspecified: Secondary | ICD-10-CM | POA: Diagnosis not present

## 2016-01-12 DIAGNOSIS — Z7982 Long term (current) use of aspirin: Secondary | ICD-10-CM | POA: Diagnosis not present

## 2016-01-12 DIAGNOSIS — D649 Anemia, unspecified: Secondary | ICD-10-CM | POA: Diagnosis present

## 2016-01-12 LAB — GLUCOSE, CAPILLARY: GLUCOSE-CAPILLARY: 125 mg/dL — AB (ref 65–99)

## 2016-01-12 LAB — BASIC METABOLIC PANEL
Anion gap: 12 (ref 5–15)
BUN: 22 mg/dL — AB (ref 6–20)
CALCIUM: 8.9 mg/dL (ref 8.9–10.3)
CO2: 20 mmol/L — AB (ref 22–32)
Chloride: 99 mmol/L — ABNORMAL LOW (ref 101–111)
Creatinine, Ser: 1.87 mg/dL — ABNORMAL HIGH (ref 0.61–1.24)
GFR calc Af Amer: 41 mL/min — ABNORMAL LOW (ref >60)
GFR, EST NON AFRICAN AMERICAN: 35 mL/min — AB (ref >60)
GLUCOSE: 241 mg/dL — AB (ref 65–99)
POTASSIUM: 4.3 mmol/L (ref 3.5–5.1)
Sodium: 131 mmol/L — ABNORMAL LOW (ref 135–145)

## 2016-01-12 LAB — CBC
HEMATOCRIT: 31.4 % — AB (ref 39.0–52.0)
Hemoglobin: 9.9 g/dL — ABNORMAL LOW (ref 13.0–17.0)
MCH: 25.8 pg — AB (ref 26.0–34.0)
MCHC: 31.5 g/dL (ref 30.0–36.0)
MCV: 81.8 fL (ref 78.0–100.0)
Platelets: 455 10*3/uL — ABNORMAL HIGH (ref 150–400)
RBC: 3.84 MIL/uL — ABNORMAL LOW (ref 4.22–5.81)
RDW: 15.5 % (ref 11.5–15.5)
WBC: 13 10*3/uL — ABNORMAL HIGH (ref 4.0–10.5)

## 2016-01-12 LAB — BRAIN NATRIURETIC PEPTIDE: B Natriuretic Peptide: 31.4 pg/mL (ref 0.0–100.0)

## 2016-01-12 LAB — CG4 I-STAT (LACTIC ACID): Lactic Acid, Venous: 2.52 mmol/L (ref 0.5–2.0)

## 2016-01-12 LAB — MAGNESIUM: Magnesium: 1.2 mg/dL — ABNORMAL LOW (ref 1.7–2.4)

## 2016-01-12 LAB — PHOSPHORUS: PHOSPHORUS: 3.3 mg/dL (ref 2.5–4.6)

## 2016-01-12 LAB — I-STAT TROPONIN, ED: Troponin i, poc: 0 ng/mL (ref 0.00–0.08)

## 2016-01-12 MED ORDER — METOPROLOL TARTRATE 25 MG PO TABS
25.0000 mg | ORAL_TABLET | Freq: Two times a day (BID) | ORAL | Status: DC
  Administered 2016-01-12: 25 mg via ORAL
  Filled 2016-01-12: qty 1

## 2016-01-12 MED ORDER — SIMVASTATIN 20 MG PO TABS
20.0000 mg | ORAL_TABLET | Freq: Every day | ORAL | Status: DC
  Administered 2016-01-12 – 2016-01-13 (×2): 20 mg via ORAL
  Filled 2016-01-12 (×2): qty 1

## 2016-01-12 MED ORDER — ASPIRIN EC 81 MG PO TBEC
81.0000 mg | DELAYED_RELEASE_TABLET | Freq: Every day | ORAL | Status: DC
  Administered 2016-01-13 – 2016-01-14 (×2): 81 mg via ORAL
  Filled 2016-01-12 (×2): qty 1

## 2016-01-12 MED ORDER — VANCOMYCIN HCL IN DEXTROSE 1-5 GM/200ML-% IV SOLN
1000.0000 mg | INTRAVENOUS | Status: DC
  Administered 2016-01-13: 1000 mg via INTRAVENOUS
  Filled 2016-01-12 (×2): qty 200

## 2016-01-12 MED ORDER — SODIUM CHLORIDE 0.9 % IV SOLN
Freq: Once | INTRAVENOUS | Status: AC
  Administered 2016-01-12: 18:00:00 via INTRAVENOUS

## 2016-01-12 MED ORDER — GABAPENTIN 300 MG PO CAPS
300.0000 mg | ORAL_CAPSULE | Freq: Three times a day (TID) | ORAL | Status: DC
  Administered 2016-01-12 – 2016-01-14 (×5): 300 mg via ORAL
  Filled 2016-01-12 (×5): qty 1

## 2016-01-12 MED ORDER — CEFEPIME HCL 2 G IJ SOLR
2.0000 g | Freq: Two times a day (BID) | INTRAMUSCULAR | Status: DC
  Administered 2016-01-13 – 2016-01-14 (×3): 2 g via INTRAVENOUS
  Filled 2016-01-12 (×4): qty 2

## 2016-01-12 MED ORDER — FERROUS SULFATE 325 (65 FE) MG PO TABS
650.0000 mg | ORAL_TABLET | Freq: Every day | ORAL | Status: DC
  Administered 2016-01-13 – 2016-01-14 (×2): 650 mg via ORAL
  Filled 2016-01-12 (×2): qty 2

## 2016-01-12 MED ORDER — SODIUM CHLORIDE 0.9% FLUSH
3.0000 mL | Freq: Two times a day (BID) | INTRAVENOUS | Status: DC
  Administered 2016-01-12 – 2016-01-13 (×2): 3 mL via INTRAVENOUS

## 2016-01-12 MED ORDER — GLIPIZIDE 10 MG PO TABS
10.0000 mg | ORAL_TABLET | Freq: Two times a day (BID) | ORAL | Status: DC
  Administered 2016-01-13 – 2016-01-14 (×3): 10 mg via ORAL
  Filled 2016-01-12 (×4): qty 1

## 2016-01-12 MED ORDER — VITAMIN D 1000 UNITS PO TABS
2000.0000 [IU] | ORAL_TABLET | Freq: Every day | ORAL | Status: DC
  Administered 2016-01-13 – 2016-01-14 (×2): 2000 [IU] via ORAL
  Filled 2016-01-12 (×2): qty 2

## 2016-01-12 MED ORDER — ONDANSETRON HCL 4 MG/2ML IJ SOLN: 4.0000 mg | Freq: Four times a day (QID) | INTRAMUSCULAR | Status: DC | PRN

## 2016-01-12 MED ORDER — SODIUM CHLORIDE 0.9 % IV SOLN
INTRAVENOUS | Status: DC
Start: 2016-01-12 — End: 2016-01-14
  Administered 2016-01-12 – 2016-01-13 (×3): via INTRAVENOUS

## 2016-01-12 MED ORDER — PANTOPRAZOLE SODIUM 40 MG PO TBEC
40.0000 mg | DELAYED_RELEASE_TABLET | Freq: Every day | ORAL | Status: DC
  Administered 2016-01-13 – 2016-01-14 (×2): 40 mg via ORAL
  Filled 2016-01-12 (×2): qty 1

## 2016-01-12 MED ORDER — CLOPIDOGREL BISULFATE 75 MG PO TABS
75.0000 mg | ORAL_TABLET | Freq: Every day | ORAL | Status: DC
  Administered 2016-01-13 – 2016-01-14 (×2): 75 mg via ORAL
  Filled 2016-01-12 (×2): qty 1

## 2016-01-12 MED ORDER — ENOXAPARIN SODIUM 30 MG/0.3ML ~~LOC~~ SOLN
30.0000 mg | SUBCUTANEOUS | Status: DC
  Administered 2016-01-12: 30 mg via SUBCUTANEOUS
  Filled 2016-01-12: qty 0.3

## 2016-01-12 MED ORDER — VANCOMYCIN HCL 10 G IV SOLR
1250.0000 mg | Freq: Once | INTRAVENOUS | Status: AC
  Administered 2016-01-12: 1250 mg via INTRAVENOUS
  Filled 2016-01-12: qty 1250

## 2016-01-12 MED ORDER — FERROUS SULFATE 325 (65 FE) MG PO TABS
325.0000 mg | ORAL_TABLET | Freq: Every day | ORAL | Status: DC
  Administered 2016-01-13: 325 mg via ORAL
  Filled 2016-01-12: qty 1

## 2016-01-12 MED ORDER — ONDANSETRON HCL 4 MG PO TABS: 4.0000 mg | ORAL_TABLET | Freq: Four times a day (QID) | ORAL | Status: DC | PRN

## 2016-01-12 MED ORDER — VANCOMYCIN HCL IN DEXTROSE 1-5 GM/200ML-% IV SOLN: 1000.0000 mg | Freq: Once | INTRAVENOUS | Status: DC

## 2016-01-12 MED ORDER — CEFEPIME HCL 2 G IJ SOLR
2.0000 g | Freq: Once | INTRAMUSCULAR | Status: AC
  Administered 2016-01-12: 2 g via INTRAVENOUS

## 2016-01-12 NOTE — ED Notes (Signed)
Pt sts tachycardia and sent for eval; pt sts going on for weeks but becoming more severe; pt had CABG 8 weeks ago; pt sts some SOB

## 2016-01-12 NOTE — ED Provider Notes (Signed)
CSN: [id number]     Arrival date & time 01/12/16  1617 History   First MD Initiated Contact with Patient 01/12/16 1706     Chief Complaint  Patient presents with  . Tachycardia   The history is provided by the patient and medical records. No language interpreter was used.   Patient is a 70 year old male with past medical history significant for coronary artery disease status post CABG 10/18/15, hypertension, hyperlipidemia, diabetes, peripheral vascular disease who presents with tachycardia. Patient welcome sleep this morning feeling his heart pounding in his ears. He subsequently had blood pressure and heart rate has come to be normotensive with a heart rate 130s. He feels it is a regular rhythm, just fast. He has associated SOB. Denies chest pain, cough, fever or other symptoms. He reports he is doing well since his surgery and has not had any recent illnesses. Prior to his bypass surgery he had pneumonia (reports similar presentation, no fever, no cough, +SOB at that time) that was treated w/ antibiotics. No history of cancer. Patient traveled to the beach this weekend (~4 hours) but reports he got out of the car and walked twice. Reports he quit smoking 10+ years ago. No new calf tenderness or swelling. Denies recent OTC medicines, including cold medicines. He drank 2 cups of coffee today, which is normal for him.   Past Medical History  Diagnosis Date  . Hypertension   . Hypercholesteremia   . GERD (gastroesophageal reflux disease)   . Diabetes mellitus without complication ([place])   . Neuropathy ([place])   . Peripheral vascular disease ([place])     s/p R-L FEM-FEM BYPASS  . CAD S/P percutaneous coronary angioplasty 1998    PCI TO CX  . Hx of CABG March 2017    x 5   Past Surgical History  Procedure Laterality Date  . Cardiac catheterization  2003    with stent  . Femoral-popliteal bypass graft    . Colonoscopy N/A 09/22/2013    Procedure: COLONOSCOPY;  Surgeon: Daneil Dolin, MD;  [facility];  Service: Endoscopy;  Laterality: N/A;  9:30 AM  . Cardiac catheterization N/A 10/11/2015    Procedure: Left Heart Cath and Coronary Angiography;  Surgeon: Peter M Martinique, MD;  Location: [facility];  Service: Cardiovascular;  Laterality: N/A;  . Coronary artery bypass graft N/A 10/18/2015    Procedure: CORONARY ARTERY BYPASS GRAFTING (CABG) x  five, using left internal mammary artery and right leg greater saphenous vein harvested endoscopically;  Surgeon: [name], MD;  Location: [facility];  Service: Open Heart Surgery;  Laterality: N/A;  . Tee without cardioversion N/A 10/18/2015    Procedure: TRANSESOPHAGEAL ECHOCARDIOGRAM (TEE);  Surgeon: [name], MD;  Location: [facility];  Service: Open Heart Surgery;  Laterality: N/A;   Family History  Problem Relation Age of Onset  . Colon cancer Neg Hx    Social History  Substance Use Topics  . Smoking status: Former Smoker -- 2.00 packs/day for 30 years    Types: Cigarettes  . Smokeless tobacco: None  . Alcohol Use: No    Review of Systems  Constitutional: Positive for fatigue. Negative for fever and chills.  HENT: Negative for congestion and rhinorrhea.   Eyes: Negative for visual disturbance.  Respiratory: Positive for shortness of breath. Negative for cough.   Cardiovascular: Positive for palpitations. Negative for chest pain and leg swelling.  Gastrointestinal: Negative for nausea, vomiting, abdominal pain and diarrhea.  Genitourinary: Negative  for dysuria and difficulty urinating.  Musculoskeletal: Negative for back pain and neck pain.  Skin: Negative for pallor and rash.  Neurological: Negative for dizziness and headaches.  Psychiatric/Behavioral: Negative for confusion.      Allergies  Review of patient's allergies indicates no known allergies.  Home Medications   Prior to Admission medications   Medication Sig Start Date End Date Taking? Authorizing Provider  amLODipine (NORVASC) 10 MG  tablet Take 0.5 tablets (5 mg total) by mouth daily. 10/22/15   Donielle Liston Alba, PA-C  aspirin 81 MG tablet Take 81 mg by mouth daily.    Historical Provider, MD  Cholecalciferol (VITAMIN D) 2000 UNITS tablet Take 2,000 Units by mouth daily.    Historical Provider, MD  clopidogrel (PLAVIX) 75 MG tablet Take 75 mg by mouth daily with breakfast.    Historical Provider, MD  ferrous sulfate 325 (65 FE) MG tablet Take 325-650 mg by mouth 2 (two) times daily with a meal. 650mg  in the morning and 325mg  in the evening    Historical Provider, MD  gabapentin (NEURONTIN) 300 MG capsule Take 300 mg by mouth 3 (three) times daily.     Historical Provider, MD  glipiZIDE (GLUCOTROL) 5 MG tablet Take 10 mg by mouth 2 (two) times daily before a meal.     Historical Provider, MD  hydrochlorothiazide (HYDRODIURIL) 25 MG tablet Take 25 mg by mouth daily.    Historical Provider, MD  lisinopril (PRINIVIL,ZESTRIL) 5 MG tablet Take 1 tablet (5 mg total) by mouth daily. 10/22/15   Donielle Liston Alba, PA-C  metFORMIN (GLUCOPHAGE) 1000 MG tablet Take 1,000 mg by mouth 2 (two) times daily with a meal.    Historical Provider, MD  metoprolol tartrate (LOPRESSOR) 25 MG tablet Take 1 tablet (25 mg total) by mouth 2 (two) times daily. 10/22/15   Donielle Liston Alba, PA-C  oxyCODONE (OXY IR/ROXICODONE) 5 MG immediate release tablet Take 1-2 tablets (5-10 mg total) by mouth every 4 (four) hours as needed for severe pain. Patient not taking: Reported on 11/16/2015 10/22/15   Nani Skillern, PA-C  pantoprazole (PROTONIX) 40 MG tablet Take 40 mg by mouth daily.    Historical Provider, MD  simvastatin (ZOCOR) 40 MG tablet Take 20 mg by mouth at bedtime.     Historical Provider, MD   BP 131/67 mmHg  Pulse 123  Temp(Src) 98.3 F (36.8 C) (Oral)  Resp 18  SpO2 99% Physical Exam  Constitutional: He is oriented to person, place, and time. He appears well-developed and well-nourished.  HENT:  Head: Normocephalic and atraumatic.   Eyes: EOM are normal. Pupils are equal, round, and reactive to light.  Neck: Normal range of motion. Neck supple.  Cardiovascular: Regular rhythm and intact distal pulses.  Tachycardia present.   Pulmonary/Chest: Effort normal and breath sounds normal. No respiratory distress.  Well healed sternotomy scar  Abdominal: Soft. He exhibits no distension. There is no tenderness.  Musculoskeletal: Normal range of motion. He exhibits no edema or tenderness.  Neurological: He is alert and oriented to person, place, and time.  Skin: Skin is warm and dry. No rash noted.  Psychiatric: He has a normal mood and affect.  Nursing note and vitals reviewed.   ED Course  Procedures (including critical care time) Labs Review Labs Reviewed  BASIC METABOLIC PANEL - Abnormal; Notable for the following:    Sodium 131 (*)    Chloride 99 (*)    CO2 20 (*)    Glucose, Bld 241 (*)  BUN 22 (*)    Creatinine, Ser 1.87 (*)    GFR calc non Af Amer 35 (*)    GFR calc Af Amer 41 (*)    All other components within normal limits  CBC - Abnormal; Notable for the following:    WBC 13.0 (*)    RBC 3.84 (*)    Hemoglobin 9.9 (*)    HCT 31.4 (*)    MCH 25.8 (*)    Platelets 455 (*)    All other components within normal limits  CULTURE, BLOOD (ROUTINE X 2)  CULTURE, BLOOD (ROUTINE X 2)  I-STAT TROPOININ, ED    Imaging Review Dg Chest 2 View  01/12/2016  CLINICAL DATA:  Shortness of breath.  Recent CABG. EXAM: CHEST  2 VIEW COMPARISON:  November 16, 2015 FINDINGS: Mild opacity is seen in the left midlung, not appreciated on the most recent study. No other infiltrate identified. No pulmonary nodule or mass. The cardiomediastinal silhouette is stable. No pneumothorax. IMPRESSION: Mild opacity in the left mid lung. An early developing infiltrate is not excluded. Recommend follow-up to resolution. Electronically Signed   By: Dorise Bullion III M.D   On: 01/12/2016 17:12   I have personally reviewed and evaluated these  images and lab results as part of my medical decision-making.   EKG Interpretation   Date/Time:  Wednesday Jan 12 2016 16:20:29 EDT Ventricular Rate:  121 PR Interval:  190 QRS Duration: 92 QT Interval:  276 QTC Calculation: 391 R Axis:   17 Text Interpretation:  Sinus tachycardia Otherwise normal ECG Other than  tachycardia, no significant change since prior EKG  Confirmed by LIU MD,  Hinton Dyer [id number]) on 01/12/2016 5:57:45 PM      MDM   Final diagnoses:  HCAP (healthcare-associated pneumonia)  Lobar pneumonia (HCC)  Tachycardia  S/P CABG (coronary artery bypass graft)    Afebrile, normotensive, tachycardic with irregular rhythm in 110s on initial evaluation. Physical exam is unremarkable. EKG shows sinus tachycardia without acute ischemic changes. Troponin undetectable. Chest x-ray is consistent with left midlung pneumonia. The patient reports that with his prior pneumonias he did not present with classic symptoms of cough and sputum production and fever. Labs significant for leukocytosis of 15 and hemoglobin 9.9. Patient will also have increase in his creatinine at 1.8. Blood cultures are sent and patient was treated for healthcare associated pneumonia given his recent admission within the last 3 months with vancomycin and cefepime.  Doubt ACS given EKG and unremarkable troponin. Considered PE, but tachycardia improved while in the ED after IV fluids, not hypoxic, patient has no history of clotting. Given leukocytosis, SOB, and CXR findings likely pneumonia.   Hospitalist consulted for admission and triage of patient. Plan to admit to floor for further management of HAP.   Patient seen and discussed with Dr. Oleta Mouse, ED attending      Gibson Ramp, MD 01/13/16 0151  Forde Dandy, MD 01/13/16 [phone]

## 2016-01-12 NOTE — ED Provider Notes (Signed)
I saw and evaluated the patient, reviewed the resident's note and I agree with the findings and plan.   EKG Interpretation   Date/Time:  Wednesday Jan 12 2016 16:20:29 EDT Ventricular Rate:  121 PR Interval:  190 QRS Duration: 92 QT Interval:  276 QTC Calculation: 391 R Axis:   17 Text Interpretation:  Sinus tachycardia Otherwise normal ECG Other than  tachycardia, no significant change since prior EKG  Confirmed by Goerge Mohr MD,  Miray Mancino [phone]) on 01/12/2016 5:57:55 PM      70 year old male who presents with tachycardia. S/p CABG 10/18/15 and has history HTN, DM, PVD, HLD. Awoke from sleep this morning with palpitations and fluttering in his ears and chest. No chest pain and ? Of sob earlier today. No fever, chills, cough, LE edema. Chronic leg pain related to PVD, and this is unchanged. On arrival, tachycardic 120s-130s. This is sinus tachycardia on the cardiac monitor and EKG. No acute ischemic changes noted. He is normotensive, breathing comfortably on room air, afebrile. Cardiopulmonary exam unremarkable.  Chest x-ray shows developing infiltrate in the left upper lobe, suggestive of pneumonia. We'll be treated with hospital-acquired pneumonia given recent surgery. Heart rate has improved with IV fluids. He also has a leukocytosis and an AKI. Is admitted for treatment of HCAP.  Forde Dandy, MD 01/12/16 [phone]

## 2016-01-12 NOTE — Telephone Encounter (Signed)
° °  Pt call back to make sure it was correct call back number

## 2016-01-12 NOTE — Telephone Encounter (Signed)
Returned call to patient.He stated he woke up this morning feeling bad, no energy,sob.Stated a nurse from his daughter's work checked B/P 110/70 pulse 136.Stated he continues to feel bad,sob,no energy.No chest pain.Advised to go to [facility] ER.Trish called.

## 2016-01-12 NOTE — Progress Notes (Addendum)
Pharmacy Antibiotic Note  [name] is a 70 y.o. male admitted on 01/12/2016 with fatigue, SOB and tachycardia.  Pt had CABG about 8 weeks ago. WBC mildly elevated, SCR 1.1 > 1.87, eCrCl 30-40 ml/min. Initiating empiric abx for pneumonia.    Plan: -Vancomycin 1250 mg IV x1 then 1g/24h -Cefepime 2 g IV q12h -Monitor renal fx, cultures, duration of therapy     Temp (24hrs), Avg:98.3 F (36.8 C), Min:98.3 F (36.8 C), Max:98.3 F (36.8 C)   Recent Labs Lab 01/12/16 1641  WBC 13.0*  CREATININE 1.87*    CrCl cannot be calculated (Unknown ideal weight.).    No Known Allergies  Antimicrobials this admission: 5/31 vanc >> 5/31 cefepime >>  Dose adjustments this admission: NA  Microbiology results: 5/31 blood cx:  Thank you for allowing pharmacy to be a part of this patient's care.  Harvel Quale 01/12/2016 6:58 PM

## 2016-01-12 NOTE — H&P (Signed)
History and Physical    [name] [id number] DOB: 09/02/1945 DOA: 01/12/2016  PCP: Pcp Not In System   Patient coming from: Home.  Chief Complaint: Tachycardia.  HPI: [name] is a 70 y.o. male with medical history significant of hypertension, hyperlipidemia, GERD, type 2 diabetes, peripheral neuropathy, PVD, CAD, status post CABG on 10/18/2015 is coming to the emergency department due to tachycardia.  Per patient, he woke up last year of 200 with palpitations and mild dyspnea. He is states that he has been having palpitations for a couple weeks, but they have been transient and without dyspnea. He denies chest pain, dizziness, diaphoresis, PND, orthopnea or pitting edema lower extremities. He states that his palpitations and dyspnea persisted through the day, so he called his doctor in the afternoon, who asked him to come to the emergency department. He denies fever, chills, headache, rhinorrhea or sore throat but states he feels fatigue. He denies cough, wheezing or rhonchi. He was recently treated for community-acquired pneumonia.  ED Course: Patient received supplemental oxygen, IV fluids and IV antibiotics experiencing relief. Workup reveals leukocytosis of 13,000, anemia and a chest radiograph with the LML field opacity.  Review of Systems: As per HPI otherwise 10 point review of systems negative.   Past Medical History  Diagnosis Date  . Hypertension   . Hypercholesteremia   . GERD (gastroesophageal reflux disease)   . Diabetes mellitus without complication ([place])   . Neuropathy ([place])   . Peripheral vascular disease ([place])     s/p R-L FEM-FEM BYPASS  . CAD S/P percutaneous coronary angioplasty 1998    PCI TO CX  . Hx of CABG March 2017    x 5    Past Surgical History  Procedure Laterality Date  . Cardiac catheterization  2003    with stent  . Femoral-popliteal bypass graft    . Colonoscopy N/A 09/22/2013    Procedure: COLONOSCOPY;  Surgeon: Daneil Dolin, MD;   [facility];  Service: Endoscopy;  Laterality: N/A;  9:30 AM  . Cardiac catheterization N/A 10/11/2015    Procedure: Left Heart Cath and Coronary Angiography;  Surgeon: Peter M Martinique, MD;  Location: [facility];  Service: Cardiovascular;  Laterality: N/A;  . Coronary artery bypass graft N/A 10/18/2015    Procedure: CORONARY ARTERY BYPASS GRAFTING (CABG) x  five, using left internal mammary artery and right leg greater saphenous vein harvested endoscopically;  Surgeon: [name], MD;  Location: [facility];  Service: Open Heart Surgery;  Laterality: N/A;  . Tee without cardioversion N/A 10/18/2015    Procedure: TRANSESOPHAGEAL ECHOCARDIOGRAM (TEE);  Surgeon: [name], MD;  Location: [facility];  Service: Open Heart Surgery;  Laterality: N/A;     reports that he has quit smoking. His smoking use included Cigarettes. He has a 60 pack-year smoking history. He does not have any smokeless tobacco history on file. He reports that he does not drink alcohol or use illicit drugs.  No Known Allergies  Family History  Problem Relation Age of Onset  . Colon cancer Neg Hx     Prior to Admission medications   Medication Sig Start Date End Date Taking? Authorizing Provider  amLODipine (NORVASC) 10 MG tablet Take 0.5 tablets (5 mg total) by mouth daily. 10/22/15   Donielle Liston Alba, PA-C  aspirin 81 MG tablet Take 81 mg by mouth daily.    Historical Provider, MD  Cholecalciferol (VITAMIN D) 2000 UNITS tablet Take 2,000 Units  by mouth daily.    Historical Provider, MD  clopidogrel (PLAVIX) 75 MG tablet Take 75 mg by mouth daily with breakfast.    Historical Provider, MD  ferrous sulfate 325 (65 FE) MG tablet Take 325-650 mg by mouth 2 (two) times daily with a meal. 650mg  in the morning and 325mg  in the evening    Historical Provider, MD  gabapentin (NEURONTIN) 300 MG capsule Take 300 mg by mouth 3 (three) times daily.     Historical Provider, MD  glipiZIDE (GLUCOTROL) 5 MG tablet  Take 10 mg by mouth 2 (two) times daily before a meal.     Historical Provider, MD  hydrochlorothiazide (HYDRODIURIL) 25 MG tablet Take 25 mg by mouth daily.    Historical Provider, MD  lisinopril (PRINIVIL,ZESTRIL) 5 MG tablet Take 1 tablet (5 mg total) by mouth daily. 10/22/15   Donielle Liston Alba, PA-C  metFORMIN (GLUCOPHAGE) 1000 MG tablet Take 1,000 mg by mouth 2 (two) times daily with a meal.    Historical Provider, MD  metoprolol tartrate (LOPRESSOR) 25 MG tablet Take 1 tablet (25 mg total) by mouth 2 (two) times daily. 10/22/15   Donielle Liston Alba, PA-C  oxyCODONE (OXY IR/ROXICODONE) 5 MG immediate release tablet Take 1-2 tablets (5-10 mg total) by mouth every 4 (four) hours as needed for severe pain. Patient not taking: Reported on 11/16/2015 10/22/15   Nani Skillern, PA-C  pantoprazole (PROTONIX) 40 MG tablet Take 40 mg by mouth daily.    Historical Provider, MD  simvastatin (ZOCOR) 40 MG tablet Take 20 mg by mouth at bedtime.     Historical Provider, MD    Physical Exam: Filed Vitals:   01/12/16 1830 01/12/16 1900 01/12/16 1930 01/12/16 1957  BP: 107/63 110/60 105/56 117/63  Pulse: 108 106 106 108  Temp:    98.5 F (36.9 C)  TempSrc:    Oral  Resp: 13 13 12 15   Height:    5\' 7"  (1.702 m)  Weight:    70.897 kg (156 lb 4.8 oz)  SpO2: 97% 97% 98% 98%      Constitutional: NAD, calm, comfortable Filed Vitals:   01/12/16 1830 01/12/16 1900 01/12/16 1930 01/12/16 1957  BP: 107/63 110/60 105/56 117/63  Pulse: 108 106 106 108  Temp:    98.5 F (36.9 C)  TempSrc:    Oral  Resp: 13 13 12 15   Height:    5\' 7"  (1.702 m)  Weight:    70.897 kg (156 lb 4.8 oz)  SpO2: 97% 97% 98% 98%   Eyes: PERRL, lids and conjunctivae normal ENMT: Mucous membranes are mildly dry. Posterior pharynx clear of any exudate or lesions.Normal dentition.  Neck: normal, supple, no masses, no thyromegaly Respiratory:  Left middle and left lower lung fields subtle crackles that disappear after a  few inspirations.                        Normal respiratory effort. No accessory muscle use.  Cardiovascular: Tachycardic with regular rhythm at 108 BPM, no murmurs / rubs / gallops. No extremity edema.                             No carotid bruits.  Abdomen: no tenderness, no masses palpated. No hepatosplenomegaly. Bowel sounds positive.  Musculoskeletal: No clubbing / cyanosis. No joint deformity upper and lower extremities.  Good ROM, no contractures. Normal muscle tone.  Skin: dry skin, abrasions on lower extremities. Positive sternotomy surgical scar No induration Neurologic: CN 2-12 grossly intact. Sensation intact, DTR normal. Strength 5/5 in all 4.  Psychiatric: Normal judgment and insight. Alert and oriented x 4. Normal mood.    Labs on Admission: I have personally reviewed following labs and imaging studies  CBC:  Recent Labs Lab 01/12/16 1641  WBC 13.0*  HGB 9.9*  HCT 31.4*  MCV 81.8  PLT [id number]*   Basic Metabolic Panel:  Recent Labs Lab 01/12/16 1641  NA 131*  K 4.3  CL 99*  CO2 20*  GLUCOSE 241*  BUN 22*  CREATININE 1.87*  CALCIUM 8.9   GFR: Estimated Creatinine Clearance: 35.3 mL/min (by C-G formula based on Cr of 1.87).  Urine analysis:    Component Value Date/Time   COLORURINE YELLOW 10/10/2015 [place] 10/10/2015 1749   LABSPEC 1.014 10/10/2015 1749   PHURINE 6.0 10/10/2015 1749   GLUCOSEU 100* 10/10/2015 1749   HGBUR NEGATIVE 10/10/2015 1749   BILIRUBINUR NEGATIVE 10/10/2015 1749   KETONESUR NEGATIVE 10/10/2015 1749   PROTEINUR NEGATIVE 10/10/2015 1749   UROBILINOGEN 0.2 03/16/2015 0120   NITRITE NEGATIVE 10/10/2015 1749   LEUKOCYTESUR NEGATIVE 10/10/2015 1749    Radiological Exams on Admission: Dg Chest 2 View  01/12/2016  CLINICAL DATA:  Shortness of breath.  Recent CABG. EXAM: CHEST  2 VIEW COMPARISON:  November 16, 2015 FINDINGS: Mild opacity is seen in the left midlung, not appreciated on the most  recent study. No other infiltrate identified. No pulmonary nodule or mass. The cardiomediastinal silhouette is stable. No pneumothorax. IMPRESSION: Mild opacity in the left mid lung. An early developing infiltrate is not excluded. Recommend follow-up to resolution. Electronically Signed   By: Dorise Bullion III M.D   On: 01/12/2016 17:12    EKG: Independently reviewed. Vent. rate 121 BPM PR interval 190 ms QRS duration 92 ms QT/QTc 276/391 ms P-R-T axes 42 17 16 Sinus tachycardia Otherwise normal ECG  Assessment/Plan Principal Problem:   HCAP (healthcare-associated pneumonia)   Sepsis ([place]) Admit to telemetry/inpatient. Continue supplemental oxygen. Continue IV hydration. Continue nebulized bronchodilators as needed. Continue IV antibiotics per pharmacy. Check Legionella and strep pneumoniae urine antigens. Check a sputum Gram stain, culture and sensitivity. Follow-up blood cultures and sensitivity.  Active Problems:   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 Stable at this time. No chest pain. Continue aspirin 81 mg by mouth daily. Continue Plavix 75 mg by mouth daily. Continue metoprolol and simvastatin.    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 Hold amlodipine to avoid hypotension. Continue metoprolol 25 mg by mouth twice a day.    HLD (hyperlipidemia) Continue simvastatin 20 mg by mouth daily. Monitor LFTs periodically.    AKI (acute kidney injury) ([place]) Continue IV fluid challenge. Hold hydrochlorothiazide and lisinopril. Follow-up renal function in a.m.    Type 2 diabetes mellitus (HCC) Carbohydrate modified diet. Home metformin. Continue glipizide. Monitor CBG.    Hyponatremia Continue IV hydration. Check urine sodium and osmolality. Follow-up sodium level.     DVT prophylaxis: Lovenox SQ. Code Status: Full code. Family Communication: His wife is present in the room. Disposition Plan: Admit for further evaluation and IV  antibiotic therapy for a few days. Consults called:  Admission status: Inpatient/telemetry.   Reubin Milan MD [facility] Pager [phone].  If 7PM-7AM, please contact night-coverage [email or web address] Password [facility]  01/12/2016, 8:38 PM

## 2016-01-12 NOTE — Telephone Encounter (Signed)
Pt called in stating that his pulse was a little high( 132bpm) but his BP was fine 110/70. He was concerned and would like to speak to someone. Please f/u with pt  Thanks

## 2016-01-13 DIAGNOSIS — N179 Acute kidney failure, unspecified: Secondary | ICD-10-CM

## 2016-01-13 DIAGNOSIS — I10 Essential (primary) hypertension: Secondary | ICD-10-CM

## 2016-01-13 DIAGNOSIS — J189 Pneumonia, unspecified organism: Secondary | ICD-10-CM

## 2016-01-13 LAB — OSMOLALITY, URINE: OSMOLALITY UR: 411 mosm/kg (ref 300–900)

## 2016-01-13 LAB — COMPREHENSIVE METABOLIC PANEL
ALK PHOS: 55 U/L (ref 38–126)
ALT: 10 U/L — AB (ref 17–63)
AST: 12 U/L — AB (ref 15–41)
Albumin: 3.1 g/dL — ABNORMAL LOW (ref 3.5–5.0)
Anion gap: 9 (ref 5–15)
BILIRUBIN TOTAL: 0.6 mg/dL (ref 0.3–1.2)
BUN: 19 mg/dL (ref 6–20)
CALCIUM: 8.9 mg/dL (ref 8.9–10.3)
CO2: 21 mmol/L — ABNORMAL LOW (ref 22–32)
CREATININE: 1.5 mg/dL — AB (ref 0.61–1.24)
Chloride: 102 mmol/L (ref 101–111)
GFR calc Af Amer: 53 mL/min — ABNORMAL LOW (ref >60)
GFR, EST NON AFRICAN AMERICAN: 46 mL/min — AB (ref >60)
Glucose, Bld: 129 mg/dL — ABNORMAL HIGH (ref 65–99)
Potassium: 4.5 mmol/L (ref 3.5–5.1)
Sodium: 132 mmol/L — ABNORMAL LOW (ref 135–145)
Total Protein: 6.1 g/dL — ABNORMAL LOW (ref 6.5–8.1)

## 2016-01-13 LAB — CBC WITH DIFFERENTIAL/PLATELET
BASOS ABS: 0 10*3/uL (ref 0.0–0.1)
Basophils Relative: 0 %
Eosinophils Absolute: 0.2 10*3/uL (ref 0.0–0.7)
Eosinophils Relative: 1 %
HEMATOCRIT: 29 % — AB (ref 39.0–52.0)
HEMOGLOBIN: 9 g/dL — AB (ref 13.0–17.0)
LYMPHS ABS: 1.3 10*3/uL (ref 0.7–4.0)
LYMPHS PCT: 12 %
MCH: 25.4 pg — AB (ref 26.0–34.0)
MCHC: 31 g/dL (ref 30.0–36.0)
MCV: 81.7 fL (ref 78.0–100.0)
Monocytes Absolute: 1 10*3/uL (ref 0.1–1.0)
Monocytes Relative: 9 %
NEUTROS ABS: 8.7 10*3/uL — AB (ref 1.7–7.7)
Neutrophils Relative %: 78 %
PLATELETS: 412 10*3/uL — AB (ref 150–400)
RBC: 3.55 MIL/uL — AB (ref 4.22–5.81)
RDW: 15.6 % — ABNORMAL HIGH (ref 11.5–15.5)
WBC: 11.2 10*3/uL — AB (ref 4.0–10.5)

## 2016-01-13 LAB — STREP PNEUMONIAE URINARY ANTIGEN: STREP PNEUMO URINARY ANTIGEN: NEGATIVE

## 2016-01-13 LAB — GLUCOSE, CAPILLARY
GLUCOSE-CAPILLARY: 219 mg/dL — AB (ref 65–99)
Glucose-Capillary: 138 mg/dL — ABNORMAL HIGH (ref 65–99)
Glucose-Capillary: 105 mg/dL — ABNORMAL HIGH (ref 65–99)
Glucose-Capillary: 157 mg/dL — ABNORMAL HIGH (ref 65–99)

## 2016-01-13 LAB — SODIUM, URINE, RANDOM: Sodium, Ur: 128 mmol/L

## 2016-01-13 LAB — TROPONIN I: Troponin I: <0.03 ng/mL (ref <0.031)

## 2016-01-13 MED ORDER — MAGNESIUM SULFATE 4 GM/100ML IV SOLN
4.0000 g | Freq: Once | INTRAVENOUS | Status: AC
  Administered 2016-01-13: 4 g via INTRAVENOUS
  Filled 2016-01-13: qty 100

## 2016-01-13 MED ORDER — IPRATROPIUM-ALBUTEROL 0.5-2.5 (3) MG/3ML IN SOLN: 3.0000 mL | Freq: Four times a day (QID) | RESPIRATORY_TRACT | Status: DC | PRN

## 2016-01-13 MED ORDER — ENOXAPARIN SODIUM 40 MG/0.4ML ~~LOC~~ SOLN
40.0000 mg | SUBCUTANEOUS | Status: DC
  Administered 2016-01-13: 40 mg via SUBCUTANEOUS
  Filled 2016-01-13: qty 0.4

## 2016-01-13 MED ORDER — METOPROLOL TARTRATE 12.5 MG HALF TABLET
12.5000 mg | ORAL_TABLET | Freq: Two times a day (BID) | ORAL | Status: DC
  Administered 2016-01-13 – 2016-01-14 (×3): 12.5 mg via ORAL
  Filled 2016-01-13 (×3): qty 1

## 2016-01-13 MED ORDER — INSULIN ASPART 100 UNIT/ML ~~LOC~~ SOLN
0.0000 [IU] | Freq: Three times a day (TID) | SUBCUTANEOUS | Status: DC
  Administered 2016-01-13: 5 [IU] via SUBCUTANEOUS
  Administered 2016-01-14: 2 [IU] via SUBCUTANEOUS

## 2016-01-13 MED ORDER — METOPROLOL TARTRATE 25 MG PO TABS: 25.0000 mg | ORAL_TABLET | Freq: Two times a day (BID) | ORAL | Status: DC

## 2016-01-13 NOTE — Progress Notes (Signed)
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 male with medical history significant of hypertension, hyperlipidemia, GERD, type 2 diabetes, peripheral neuropathy, PVD, CAD, status post CABG on 10/18/2015 is coming to the emergency department due to tachycardia, shortness of breath, admitted for pneumonia/ HCAP    Assessment and plan  HCAP (healthcare-associated pneumonia)  Sepsis ([place]) Continue telemetry-patient will involve Continue supplemental oxygen. Continue IV hydration. Continue nebulized bronchodilators as needed. Continue IV antibiotics per pharmacy. Check Legionella and strep pneumoniae urine antigens. Check a sputum Gram stain, culture and sensitivity. Follow-up blood cultures and sensitivity.     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 No chest pain. Continue aspirin 81 mg by mouth daily. Continue Plavix 75 mg by mouth daily. Continue metoprolol and simvastatin.   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 Hold amlodipine to avoid hypotension. Continue gentle IV fluids Decreased metoprolol 25 mg by mouth twice a day.-Pauses on telemetry   HLD (hyperlipidemia) Continue simvastatin 20 mg by mouth daily. Monitor LFTs periodically.   AKI (acute kidney injury) ([place]) Continue IV fluid challenge. Hold hydrochlorothiazide and lisinopril. Follow-up renal function in a.m.   Type 2 diabetes mellitus (HCC) Carbohydrate modified diet. Check hemoglobin A1c Hold metformin. Start insulin sliding scale Continue glipizide. Monitor CBG.    Hyponatremia/SIADH Continue IV hydration. Hold diuretics Check urine sodium and osmolality. Follow-up sodium level.    DVT prophylaxsis Lovenox  Code Status:  Full code    Family Communication: Discussed in detail with the patient, all imaging results, lab results explained to the patient   Disposition Plan:  Anticipate discharge in one to 2 days     Consultants:  None  Procedures:  None  Antibiotics: Anti-infectives    Start     Dose/Rate Route Frequency Ordered Stop   01/13/16 2000  vancomycin (VANCOCIN) IVPB 1000 mg/200 mL premix     1,000 mg 200 mL/hr over 60 Minutes Intravenous Every 24 hours 01/12/16 1923     01/13/16 0800  ceFEPIme (MAXIPIME) 2 g in dextrose 5 % 50 mL IVPB     2 g 100 mL/hr over 30 Minutes Intravenous Every 12 hours 01/12/16 1923        HPI/Subjective: Patient states he's had significant improvement since yesterday  Objective: Filed Vitals:   01/12/16 1930 01/12/16 1957 01/13/16 0103 01/13/16 0629  BP: 105/56 117/63 102/42 109/56  Pulse: 106 108 93 104  Temp:  98.5 F (36.9 C) 98.6 F (37 C) 98.9 F (37.2 C)  TempSrc:  Oral Oral Oral  Resp: 12 15 15 15   Height:  5\' 7"  (1.702 m)    Weight:  70.897 kg (156 lb 4.8 oz)  70.943 kg (156 lb 6.4 oz)  SpO2: 98% 98% 93% 95%    Intake/Output Summary (Last 24 hours) at 01/13/16 0809 Last data filed at 01/13/16 0031  Gross per 24 hour  Intake      0 ml  Output    750 ml  Net   -750 ml    Exam:  Examination:  General exam: Appears calm and comfortable  Respiratory system: Clear to auscultation. Respiratory effort normal. Cardiovascular system: S1 & S2 heard, RRR. No JVD, murmurs, rubs, gallops or clicks. No pedal edema. Gastrointestinal system: Abdomen is nondistended, soft and nontender. No organomegaly or masses felt. Normal bowel sounds heard. Central nervous system: Alert and oriented. No focal neurological deficits. Extremities: Symmetric 5 x 5 power. Skin: No rashes, lesions  or ulcers Psychiatry: Judgement and insight appear normal. Mood & affect appropriate.     Data Reviewed: I have personally reviewed following labs and imaging studies  Micro Results No results found for this or any previous visit (from the past 240 hour(s)).  Radiology Reports Dg Chest 2 View  01/12/2016  CLINICAL DATA:  Shortness of breath.  Recent CABG. EXAM: CHEST  2 VIEW COMPARISON:  November 16, 2015 FINDINGS: Mild opacity is seen in the left midlung, not appreciated on the most recent study. No other infiltrate identified. No pulmonary nodule or mass. The cardiomediastinal silhouette is stable. No pneumothorax. IMPRESSION: Mild opacity in the left mid lung. An early developing infiltrate is not excluded. Recommend follow-up to resolution. Electronically Signed   By: Dorise Bullion III M.D   On: 01/12/2016 17:12     CBC  Recent Labs Lab 01/12/16 1641 01/13/16 0350  WBC 13.0* 11.2*  HGB 9.9* 9.0*  HCT 31.4* 29.0*  PLT 455* 412*  MCV 81.8 81.7  MCH 25.8* 25.4*  MCHC 31.5 31.0  RDW 15.5 15.6*  LYMPHSABS  --  1.3  MONOABS  --  1.0  EOSABS  --  0.2  BASOSABS  --  0.0    Chemistries   Recent Labs Lab 01/12/16 1641 01/13/16 0350  NA 131* 132*  K 4.3 4.5  CL 99* 102  CO2 20* 21*  GLUCOSE 241* 129*  BUN 22* 19  CREATININE 1.87* 1.50*  CALCIUM 8.9 8.9  MG 1.2*  --   AST  --  12*  ALT  --  10*  ALKPHOS  --  55  BILITOT  --  0.6   ------------------------------------------------------------------------------------------------------------------ estimated creatinine clearance is 44.1 mL/min (by C-G formula based on Cr of 1.5). ------------------------------------------------------------------------------------------------------------------ No results for input(s): HGBA1C in the last 72 hours. ------------------------------------------------------------------------------------------------------------------ No results for input(s): CHOL, HDL, LDLCALC, TRIG, CHOLHDL,  LDLDIRECT in the last 72 hours. ------------------------------------------------------------------------------------------------------------------ No results for input(s): TSH, T4TOTAL, T3FREE, THYROIDAB in the last 72 hours.  Invalid input(s): FREET3 ------------------------------------------------------------------------------------------------------------------ No results for input(s): VITAMINB12, FOLATE, FERRITIN, TIBC, IRON, RETICCTPCT in the last 72 hours.  Coagulation profile No results for input(s): INR, PROTIME in the last 168 hours.  No results for input(s): DDIMER in the last 72 hours.  Cardiac Enzymes No results for input(s): CKMB, TROPONINI, MYOGLOBIN in the last 168 hours.  Invalid input(s): CK ------------------------------------------------------------------------------------------------------------------ Invalid input(s): POCBNP   CBG:  Recent Labs Lab 01/12/16 2057 01/13/16 0645  GLUCAP 125* 138*       Studies: Dg Chest 2 View  01/12/2016  CLINICAL DATA:  Shortness of breath.  Recent CABG. EXAM: CHEST  2 VIEW COMPARISON:  November 16, 2015 FINDINGS: Mild opacity is seen in the left midlung, not appreciated on the most recent study. No other infiltrate identified. No pulmonary nodule or mass. The cardiomediastinal silhouette is stable. No pneumothorax. IMPRESSION: Mild opacity in the left mid lung. An early developing infiltrate is not excluded. Recommend follow-up to resolution. Electronically Signed   By: Dorise Bullion III M.D   On: 01/12/2016 17:12  Lab Results  Component Value Date   HGBA1C 7.9* 10/09/2015   HGBA1C * 06/27/2007    9.0 (NOTE)   The ADA recommends the following therapeutic goals for glycemic   control related to Hgb A1C measurement:   Goal of Therapy:   < 7.0% Hgb A1C   Action Suggested:  > 8.0% Hgb A1C   Ref:  Diabetes Care, 22, Suppl. 1, 1999   Lab Results  Component Value Date   LDLCALC 56 10/11/2015   CREATININE 1.50*  01/13/2016       Scheduled Meds: . aspirin EC  81 mg Oral Daily  . ceFEPime (MAXIPIME) IV  2 g Intravenous Q12H  . cholecalciferol  2,000 Units Oral Daily  . clopidogrel  75 mg Oral Q breakfast  . enoxaparin (LOVENOX) injection  30 mg Subcutaneous Q24H  . ferrous sulfate  325 mg Oral Q supper  . ferrous sulfate  650 mg Oral Q breakfast  . gabapentin  300 mg Oral TID  . glipiZIDE  10 mg Oral BID AC  . magnesium sulfate 1 - 4 g bolus IVPB  4 g Intravenous Once  . metoprolol tartrate  25 mg Oral BID  . pantoprazole  40 mg Oral Daily  . simvastatin  20 mg Oral QHS  . sodium chloride flush  3 mL Intravenous Q12H  . vancomycin  1,000 mg Intravenous Q24H   Continuous Infusions: . sodium chloride 100 mL/hr at 01/12/16 2000     LOS: 1 day    Time spent: >30 MINS    [facility]  [facility] Pager [phone]. If 7PM-7AM, please contact night-coverage at [email or web address], password [facility] 01/13/2016, 8:09 AM  LOS: 1 day

## 2016-01-13 NOTE — Progress Notes (Signed)
Inpatient Diabetes Program Recommendations  AACE/ADA: New Consensus Statement on Inpatient Glycemic Control (2015)  Target Ranges:  Prepandial:   less than 140 mg/dL      Peak postprandial:   less than 180 mg/dL (1-2 hours)      Critically ill patients:  140 - 180 mg/dL  Results for [name], [name] (MRN [id number]) as of 01/13/2016 08:08  Ref. Range 01/12/2016 20:57 01/13/2016 06:45  Glucose-Capillary Latest Ref Range: 65-99 mg/dL 125 (H) 138 (H)   Review of Glycemic Control  Diabetes history: DM2 Outpatient Diabetes medications: Metformin 1000 mg BID, Glipizide 10 mg BID Current orders for Inpatient glycemic control: Glipizide 10 mg BID  Inpatient Diabetes Program Recommendations: Correction (SSI): Please consider ordering CBGs with Novolog correction scale ACHS. HgbA1C: Please consider ordering an A1C to evaluate glycemic control over the past 2-3 months.  Thanks, Barnie Alderman, RN, MSN, CDE Diabetes Coordinator Inpatient Diabetes Program [phone] (Team Pager from [place] to [place]) [phone] ([place] office) [phone] [facility] office) [phone] [facility] office)

## 2016-01-14 LAB — CBC WITH DIFFERENTIAL/PLATELET
Basophils Absolute: 0 10*3/uL (ref 0.0–0.1)
Basophils Relative: 0 %
EOS ABS: 0.2 10*3/uL (ref 0.0–0.7)
EOS PCT: 3 %
HCT: 30.3 % — ABNORMAL LOW (ref 39.0–52.0)
Hemoglobin: 9.4 g/dL — ABNORMAL LOW (ref 13.0–17.0)
LYMPHS ABS: 1.3 10*3/uL (ref 0.7–4.0)
Lymphocytes Relative: 15 %
MCH: 25.3 pg — AB (ref 26.0–34.0)
MCHC: 31 g/dL (ref 30.0–36.0)
MCV: 81.7 fL (ref 78.0–100.0)
MONO ABS: 1.4 10*3/uL — AB (ref 0.1–1.0)
MONOS PCT: 16 %
Neutro Abs: 5.7 10*3/uL (ref 1.7–7.7)
Neutrophils Relative %: 66 %
PLATELETS: 421 10*3/uL — AB (ref 150–400)
RBC: 3.71 MIL/uL — ABNORMAL LOW (ref 4.22–5.81)
RDW: 15.6 % — ABNORMAL HIGH (ref 11.5–15.5)
WBC: 8.7 10*3/uL (ref 4.0–10.5)

## 2016-01-14 LAB — HEMOGLOBIN A1C
Hgb A1c MFr Bld: 8.4 % — ABNORMAL HIGH (ref 4.8–5.6)
MEAN PLASMA GLUCOSE: 194 mg/dL

## 2016-01-14 LAB — COMPREHENSIVE METABOLIC PANEL
ALK PHOS: 55 U/L (ref 38–126)
ALT: 8 U/L — ABNORMAL LOW (ref 17–63)
AST: 15 U/L (ref 15–41)
Albumin: 3.1 g/dL — ABNORMAL LOW (ref 3.5–5.0)
Anion gap: 6 (ref 5–15)
BUN: 15 mg/dL (ref 6–20)
CALCIUM: 8.8 mg/dL — AB (ref 8.9–10.3)
CHLORIDE: 103 mmol/L (ref 101–111)
CO2: 24 mmol/L (ref 22–32)
CREATININE: 1.38 mg/dL — AB (ref 0.61–1.24)
GFR calc Af Amer: 59 mL/min — ABNORMAL LOW (ref >60)
GFR, EST NON AFRICAN AMERICAN: 51 mL/min — AB (ref >60)
Glucose, Bld: 117 mg/dL — ABNORMAL HIGH (ref 65–99)
Potassium: 4.4 mmol/L (ref 3.5–5.1)
SODIUM: 133 mmol/L — AB (ref 135–145)
Total Bilirubin: 0.5 mg/dL (ref 0.3–1.2)
Total Protein: 6.1 g/dL — ABNORMAL LOW (ref 6.5–8.1)

## 2016-01-14 LAB — LEGIONELLA PNEUMOPHILA SEROGP 1 UR AG: L. pneumophila Serogp 1 Ur Ag: NEGATIVE

## 2016-01-14 LAB — GLUCOSE, CAPILLARY: Glucose-Capillary: 126 mg/dL — ABNORMAL HIGH (ref 65–99)

## 2016-01-14 MED ORDER — LEVOFLOXACIN 500 MG PO TABS: 500.0000 mg | ORAL_TABLET | Freq: Every day | ORAL | Status: DC

## 2016-01-14 NOTE — Care Management Important Message (Signed)
Important Message  Patient Details  Name: [name] MRN: [id number] Date of Birth: [birth year]   Medicare Important Message Given:  Yes    Loann Quill 01/14/2016, 9:29 AM

## 2016-01-14 NOTE — Consult Note (Addendum)
   [facility] Inpatient Consult   01/14/2016  [name] Mar 18, 1946 [id number]   Referral received to assess for care management services.    Met with the patient and wife regarding the benefits of [facility] Care Management services of his [facility].   Patient states he is no longer a patient at [facility] and that he only goes for his primary care provider at the [facility] in [place] , [place].  Explained that [facility] is a covered benefit of insurance. Review information for [facility] Care Management and a brochure was provided with contact information.  Explained that [facility] [facility] does not interfere with or replace any services arranged by the inpatient care management staff.  Patient declined services with [facility] for any needs at this time.  He states he is trying his best to get over his pneumonia.  Patient did accept a brochure with contact information.  For questions, please contact:  Natividad Brood, RN BSN [facility] Liaison  [phone] business mobile phone Toll free office [phone]  Patient states he will soon start cardio-pulmonary rehab at [facility] and is scheduled to go for orientation soon.   Natividad Brood, RN BSN [facility] Liaison  [phone] business mobile phone Toll free office [phone]

## 2016-01-14 NOTE — Progress Notes (Signed)
Patient ambulated 500 ft. Oxygen saturations maintained above 91%. No complications.

## 2016-01-14 NOTE — Discharge Summary (Signed)
Physician Discharge Summary  DSEAN Dunn MRN: [id number] DOB/AGE: Dec 27, 1945 70 y.o.  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 Qty: 5 tablet, Refills: 0      CONTINUE these medications which have NOT CHANGED   Details  amLODipine (NORVASC) 10 MG tablet Take 0.5 tablets (5 mg total) by mouth daily. Qty: 30 tablet, Refills: 1    aspirin 81 MG tablet Take 81 mg by mouth daily.    Cholecalciferol (VITAMIN D) 2000 UNITS tablet Take 2,000 Units by mouth daily.    clopidogrel (PLAVIX) 75 MG tablet Take 75 mg by mouth daily with breakfast.    ferrous sulfate 325 (65 FE) MG tablet Take 325-650 mg by mouth 2 (two) times daily with a meal. 617m in the morning and 3266min the evening    gabapentin (NEURONTIN) 300 MG capsule Take 300 mg by mouth 3 (three) times daily.     glipiZIDE (GLUCOTROL) 5 MG tablet Take 10 mg by mouth 2 (two) times daily before a meal.     metFORMIN (GLUCOPHAGE) 1000 MG tablet Take 1,000 mg by mouth 2 (two) times daily with a meal.    metoprolol tartrate (LOPRESSOR) 25 MG tablet Take 1 tablet (25 mg total) by mouth 2 (two) times daily. Qty: 60 tablet, Refills: 1    pantoprazole (PROTONIX) 40 MG tablet Take 40 mg by mouth daily.    simvastatin  (ZOCOR) 40 MG tablet Take 20 mg by mouth at bedtime.     oxyCODONE (OXY IR/ROXICODONE) 5 MG immediate release tablet Take 1-2 tablets (5-10 mg total) by mouth every 4 (four) hours as needed for severe pain. Qty: 30 tablet, Refills: 0      STOP taking these medications     hydrochlorothiazide (HYDRODIURIL) 25 MG tablet      lisinopril (PRINIVIL,ZESTRIL) 5 MG tablet          Discharge Condition: Stable  Discharge Instructions Get Medicines reviewed and adjusted: Please take all your medications with you for your next visit with your Primary MD  Please request your Primary MD to go over all hospital tests and procedure/radiological results at the follow up, please ask your Primary MD to get all Hospital records sent to his/her office.  If you experience worsening of your admission symptoms, develop shortness of breath, life threatening emergency, suicidal or homicidal thoughts you must seek medical attention immediately by calling 911 or calling your MD immediately if symptoms less severe.  You must read complete instructions/literature along with all the possible adverse reactions/side effects for all the Medicines you take and that have been prescribed to you. Take any new Medicines after you have completely understood and accpet all the possible adverse reactions/side effects.   Do not drive when  taking Pain medications.   Do not take more than prescribed Pain, Sleep and Anxiety Medications  Special Instructions: If you have smoked or chewed Tobacco in the last 2 yrs please stop smoking, stop any regular Alcohol and or any Recreational drug use.  Wear Seat belts while driving.  Please note  You were cared for by a hospitalist during your hospital stay. Once you are discharged, your primary care physician will handle any further medical issues. Please note that NO REFILLS for any discharge medications will be authorized once you are discharged, as it is imperative that you  return to your primary care physician (or establish a relationship with a primary care physician if you do not have one) for your aftercare needs so that they can reassess your need for medications and monitor your lab values.  Discharge Instructions    Diet - low sodium heart healthy    Complete by:  As directed      Increase activity slowly    Complete by:  As directed             No Known Allergies    Disposition: 01-Home or Self Care   Consults: * None   Significant Diagnostic Studies:  Dg Chest 2 View  01/12/2016  CLINICAL DATA:  Shortness of breath.  Recent CABG. EXAM: CHEST  2 VIEW COMPARISON:  November 16, 2015 FINDINGS: Mild opacity is seen in the left midlung, not appreciated on the most recent study. No other infiltrate identified. No pulmonary nodule or mass. The cardiomediastinal silhouette is stable. No pneumothorax. IMPRESSION: Mild opacity in the left mid lung. An early developing infiltrate is not excluded. Recommend follow-up to resolution. Electronically Signed   By: Dorise Bullion III M.D   On: 01/12/2016 17:12       Filed Weights   01/12/16 1957 01/13/16 0629 01/14/16 0604  Weight: 70.897 kg (156 lb 4.8 oz) 70.943 kg (156 lb 6.4 oz) 70.308 kg (155 lb)     Microbiology: Recent Results (from the past 240 hour(s))  Blood culture (routine x 2)     Status: None (Preliminary result)   Collection Time: 01/12/16  7:14 PM  Result Value Ref Range Status   Specimen Description BLOOD RIGHT ANTECUBITAL  Final   Special Requests BOTTLES DRAWN AEROBIC AND ANAEROBIC 5CC  Final   Culture NO GROWTH < 24 HOURS  Final   Report Status PENDING  Incomplete  Blood culture (routine x 2)     Status: None (Preliminary result)   Collection Time: 01/12/16  7:19 PM  Result Value Ref Range Status   Specimen Description BLOOD RIGHT HAND  Final   Special Requests BOTTLES DRAWN AEROBIC AND ANAEROBIC 5CC  Final   Culture NO GROWTH < 24 HOURS  Final   Report Status PENDING   Incomplete       Blood Culture    Component Value Date/Time   SDES BLOOD RIGHT HAND 01/12/2016 1919   SPECREQUEST BOTTLES DRAWN AEROBIC AND ANAEROBIC 5CC 01/12/2016 1919   CULT NO GROWTH < 24 HOURS 01/12/2016 1919   REPTSTATUS PENDING 01/12/2016 1919      Labs: Results for orders placed or performed during the hospital encounter of 01/12/16 (from the past 48 hour(s))  Basic metabolic panel     Status: Abnormal   Collection Time: 01/12/16  4:41 PM  Result Value Ref Range   Sodium 131 (L) 135 - 145 mmol/L   Potassium 4.3 3.5 - 5.1 mmol/L   Chloride 99 (L) 101 -  111 mmol/L   CO2 20 (L) 22 - 32 mmol/L   Glucose, Bld 241 (H) 65 - 99 mg/dL   BUN 22 (H) 6 - 20 mg/dL   Creatinine, Ser 1.87 (H) 0.61 - 1.24 mg/dL   Calcium 8.9 8.9 - 10.3 mg/dL   GFR calc non Af Amer 35 (L) >60 mL/min   GFR calc Af Amer 41 (L) >60 mL/min    Comment: (NOTE) The eGFR has been calculated using the CKD EPI equation. This calculation has not been validated in all clinical situations. eGFR's persistently <60 mL/min signify possible Chronic Kidney Disease.    Anion gap 12 5 - 15  CBC     Status: Abnormal   Collection Time: 01/12/16  4:41 PM  Result Value Ref Range   WBC 13.0 (H) 4.0 - 10.5 K/uL   RBC 3.84 (L) 4.22 - 5.81 MIL/uL   Hemoglobin 9.9 (L) 13.0 - 17.0 g/dL   HCT 31.4 (L) 39.0 - 52.0 %   MCV 81.8 78.0 - 100.0 fL   MCH 25.8 (L) 26.0 - 34.0 pg   MCHC 31.5 30.0 - 36.0 g/dL   RDW 15.5 11.5 - 15.5 %   Platelets 455 (H) 150 - 400 K/uL  Magnesium     Status: Abnormal   Collection Time: 01/12/16  4:41 PM  Result Value Ref Range   Magnesium 1.2 (L) 1.7 - 2.4 mg/dL  Phosphorus     Status: None   Collection Time: 01/12/16  4:41 PM  Result Value Ref Range   Phosphorus 3.3 2.5 - 4.6 mg/dL  I-stat troponin, ED     Status: None   Collection Time: 01/12/16  4:56 PM  Result Value Ref Range   Troponin i, poc 0.00 0.00 - 0.08 ng/mL   Comment 3            Comment: Due to the release kinetics of  cTnI, a negative result within the first hours of the onset of symptoms does not rule out myocardial infarction with certainty. If myocardial infarction is still suspected, repeat the test at appropriate intervals.   Blood culture (routine x 2)     Status: None (Preliminary result)   Collection Time: 01/12/16  7:14 PM  Result Value Ref Range   Specimen Description BLOOD RIGHT ANTECUBITAL    Special Requests BOTTLES DRAWN AEROBIC AND ANAEROBIC 5CC    Culture NO GROWTH < 24 HOURS    Report Status PENDING   Blood culture (routine x 2)     Status: None (Preliminary result)   Collection Time: 01/12/16  7:19 PM  Result Value Ref Range   Specimen Description BLOOD RIGHT HAND    Special Requests BOTTLES DRAWN AEROBIC AND ANAEROBIC 5CC    Culture NO GROWTH < 24 HOURS    Report Status PENDING   Brain natriuretic peptide     Status: None   Collection Time: 01/12/16  7:19 PM  Result Value Ref Range   B Natriuretic Peptide 31.4 0.0 - 100.0 pg/mL  CG4 I-STAT (Lactic acid)     Status: Abnormal   Collection Time: 01/12/16  7:50 PM  Result Value Ref Range   Lactic Acid, Venous 2.52 (HH) 0.5 - 2.0 mmol/L   Comment NOTIFIED PHYSICIAN   Glucose, capillary     Status: Abnormal   Collection Time: 01/12/16  8:57 PM  Result Value Ref Range   Glucose-Capillary 125 (H) 65 - 99 mg/dL   Comment 1 Notify RN    Comment 2 Document in  Chart   CBC WITH DIFFERENTIAL     Status: Abnormal   Collection Time: 01/13/16  3:50 AM  Result Value Ref Range   WBC 11.2 (H) 4.0 - 10.5 K/uL   RBC 3.55 (L) 4.22 - 5.81 MIL/uL   Hemoglobin 9.0 (L) 13.0 - 17.0 g/dL   HCT 29.0 (L) 39.0 - 52.0 %   MCV 81.7 78.0 - 100.0 fL   MCH 25.4 (L) 26.0 - 34.0 pg   MCHC 31.0 30.0 - 36.0 g/dL   RDW 15.6 (H) 11.5 - 15.5 %   Platelets 412 (H) 150 - 400 K/uL   Neutrophils Relative % 78 %   Neutro Abs 8.7 (H) 1.7 - 7.7 K/uL   Lymphocytes Relative 12 %   Lymphs Abs 1.3 0.7 - 4.0 K/uL   Monocytes Relative 9 %   Monocytes Absolute 1.0  0.1 - 1.0 K/uL   Eosinophils Relative 1 %   Eosinophils Absolute 0.2 0.0 - 0.7 K/uL   Basophils Relative 0 %   Basophils Absolute 0.0 0.0 - 0.1 K/uL  Comprehensive metabolic panel     Status: Abnormal   Collection Time: 01/13/16  3:50 AM  Result Value Ref Range   Sodium 132 (L) 135 - 145 mmol/L   Potassium 4.5 3.5 - 5.1 mmol/L   Chloride 102 101 - 111 mmol/L   CO2 21 (L) 22 - 32 mmol/L   Glucose, Bld 129 (H) 65 - 99 mg/dL   BUN 19 6 - 20 mg/dL   Creatinine, Ser 1.50 (H) 0.61 - 1.24 mg/dL   Calcium 8.9 8.9 - 10.3 mg/dL   Total Protein 6.1 (L) 6.5 - 8.1 g/dL   Albumin 3.1 (L) 3.5 - 5.0 g/dL   AST 12 (L) 15 - 41 U/L   ALT 10 (L) 17 - 63 U/L   Alkaline Phosphatase 55 38 - 126 U/L   Total Bilirubin 0.6 0.3 - 1.2 mg/dL   GFR calc non Af Amer 46 (L) >60 mL/min   GFR calc Af Amer 53 (L) >60 mL/min    Comment: (NOTE) The eGFR has been calculated using the CKD EPI equation. This calculation has not been validated in all clinical situations. eGFR's persistently <60 mL/min signify possible Chronic Kidney Disease.    Anion gap 9 5 - 15  Strep pneumoniae urinary antigen     Status: None   Collection Time: 01/13/16  6:28 AM  Result Value Ref Range   Strep Pneumo Urinary Antigen NEGATIVE NEGATIVE    Comment:        Infection due to S. pneumoniae cannot be absolutely ruled out since the antigen present may be below the detection limit of the test.   Osmolality, urine     Status: None   Collection Time: 01/13/16  6:28 AM  Result Value Ref Range   Osmolality, Ur 411 300 - 900 mOsm/kg  Sodium, urine, random     Status: None   Collection Time: 01/13/16  6:28 AM  Result Value Ref Range   Sodium, Ur 128 mmol/L  Glucose, capillary     Status: Abnormal   Collection Time: 01/13/16  6:45 AM  Result Value Ref Range   Glucose-Capillary 138 (H) 65 - 99 mg/dL  Hemoglobin A1c     Status: Abnormal   Collection Time: 01/13/16  8:49 AM  Result Value Ref Range   Hgb A1c MFr Bld 8.4 (H) 4.8 - 5.6  %    Comment: (NOTE)  Pre-diabetes: 5.7 - 6.4         Diabetes: >6.4         Glycemic control for adults with diabetes: <7.0    Mean Plasma Glucose 194 mg/dL    Comment: (NOTE) Performed At: [facility], [facility] [place], [place] [id number] Lindon Romp MD [id number]   Troponin I     Status: None   Collection Time: 01/13/16 11:21 AM  Result Value Ref Range   Troponin I <0.03 <0.031 ng/mL    Comment:        NO INDICATION OF MYOCARDIAL INJURY.   Glucose, capillary     Status: Abnormal   Collection Time: 01/13/16 11:30 AM  Result Value Ref Range   Glucose-Capillary 219 (H) 65 - 99 mg/dL   Comment 1 Notify RN   Glucose, capillary     Status: Abnormal   Collection Time: 01/13/16  4:21 PM  Result Value Ref Range   Glucose-Capillary 105 (H) 65 - 99 mg/dL  Glucose, capillary     Status: Abnormal   Collection Time: 01/13/16  9:22 PM  Result Value Ref Range   Glucose-Capillary 157 (H) 65 - 99 mg/dL   Comment 1 Notify RN    Comment 2 Document in Chart   CBC WITH DIFFERENTIAL     Status: Abnormal   Collection Time: 01/14/16  2:19 AM  Result Value Ref Range   WBC 8.7 4.0 - 10.5 K/uL   RBC 3.71 (L) 4.22 - 5.81 MIL/uL   Hemoglobin 9.4 (L) 13.0 - 17.0 g/dL   HCT 30.3 (L) 39.0 - 52.0 %   MCV 81.7 78.0 - 100.0 fL   MCH 25.3 (L) 26.0 - 34.0 pg   MCHC 31.0 30.0 - 36.0 g/dL   RDW 15.6 (H) 11.5 - 15.5 %   Platelets 421 (H) 150 - 400 K/uL   Neutrophils Relative % 66 %   Neutro Abs 5.7 1.7 - 7.7 K/uL   Lymphocytes Relative 15 %   Lymphs Abs 1.3 0.7 - 4.0 K/uL   Monocytes Relative 16 %   Monocytes Absolute 1.4 (H) 0.1 - 1.0 K/uL   Eosinophils Relative 3 %   Eosinophils Absolute 0.2 0.0 - 0.7 K/uL   Basophils Relative 0 %   Basophils Absolute 0.0 0.0 - 0.1 K/uL  Comprehensive metabolic panel     Status: Abnormal   Collection Time: 01/14/16  2:19 AM  Result Value Ref Range   Sodium 133 (L) 135 - 145 mmol/L   Potassium 4.4 3.5 - 5.1 mmol/L   Chloride 103  101 - 111 mmol/L   CO2 24 22 - 32 mmol/L   Glucose, Bld 117 (H) 65 - 99 mg/dL   BUN 15 6 - 20 mg/dL   Creatinine, Ser 1.38 (H) 0.61 - 1.24 mg/dL   Calcium 8.8 (L) 8.9 - 10.3 mg/dL   Total Protein 6.1 (L) 6.5 - 8.1 g/dL   Albumin 3.1 (L) 3.5 - 5.0 g/dL   AST 15 15 - 41 U/L   ALT 8 (L) 17 - 63 U/L   Alkaline Phosphatase 55 38 - 126 U/L   Total Bilirubin 0.5 0.3 - 1.2 mg/dL   GFR calc non Af Amer 51 (L) >60 mL/min   GFR calc Af Amer 59 (L) >60 mL/min    Comment: (NOTE) The eGFR has been calculated using the CKD EPI equation. This calculation has not been validated in all clinical situations. eGFR's persistently <60 mL/min signify possible Chronic Kidney Disease.  Anion gap 6 5 - 15  Glucose, capillary     Status: Abnormal   Collection Time: 01/14/16  7:05 AM  Result Value Ref Range   Glucose-Capillary 126 (H) 65 - 99 mg/dL     Lipid Panel     Component Value Date/Time   CHOL 110 10/11/2015 0536   TRIG 135 10/11/2015 0536   HDL 27* 10/11/2015 0536   CHOLHDL 4.1 10/11/2015 0536   VLDL 27 10/11/2015 0536   LDLCALC 56 10/11/2015 0536     Lab Results  Component Value Date   HGBA1C 8.4* 01/13/2016   HGBA1C 7.9* 10/09/2015   HGBA1C * 06/27/2007    9.0 (NOTE)   The ADA recommends the following therapeutic goals for glycemic   control related to Hgb A1C measurement:   Goal of Therapy:   < 7.0% Hgb A1C   Action Suggested:  > 8.0% Hgb A1C   Ref:  Diabetes Care, 22, Suppl. 1, 1999        HPI : [name] is a 70 y.o. male with medical history significant of hypertension, hyperlipidemia, GERD, type 2 diabetes, peripheral neuropathy, PVD, CAD, status post CABG on 10/18/2015 is coming to the emergency department due to tachycardia.  Per patient, he woke up last year of 200 with palpitations and mild dyspnea. He is states that he has been having palpitations for a couple weeks, but they have been transient and without dyspnea. He denies chest pain, dizziness, diaphoresis,  PND, orthopnea or pitting edema lower extremities. He states that his palpitations and dyspnea persisted through the day, so he called his doctor in the afternoon, who asked him to come to the emergency department. He denies fever, chills, headache, rhinorrhea or sore throat but states he feels fatigue. He denies cough, wheezing or rhonchi. He was recently treated for community-acquired pneumonia.  ED Course: Patient received supplemental oxygen, IV fluids and IV antibiotics experiencing relief. Workup reveals leukocytosis of 13,000, anemia and a chest radiograph with the LML field opacity.   HOSPITAL COURSE:    HCAP (healthcare-associated pneumonia)  Sepsis ([place]) Patient improved with treatment with cefepime/vancomycin. Now discharged on Levaquin 5 days Patient also presented with acute kidney injury-improving Received nebulized bronchodilators as needed. Continue IV antibiotics per pharmacy.  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 No chest pain. Continue aspirin 81 mg by mouth daily. Continue Plavix 75 mg by mouth daily. Continue metoprolol and simvastatin.   Essential hypertension Hold lisinopril and hydrochlorothiazide due to acute kidney injury/hyponatremia,   Resume sodium and kidney function back to baseline Continue metoprolol and Norvasc    HLD (hyperlipidemia) Continue simvastatin 20 mg by mouth daily. Monitor LFTs periodically.   AKI (acute kidney injury) ([facility])-baseline 1.1, 1.87 on admission Improved with IV fluids Hold hydrochlorothiazide and lisinopril. Creatinine 1.38 on the day of discharge    Type 2 diabetes mellitus ([facility]) Carbohydrate modified diet. Hemoglobin A1c 8.4 Hold metformin. Resume oral hypoglycemic medications Continue glipizide.     Hyponatremia/SIADH Continue IV hydration. Hold diuretics Check urine sodium and  osmolality. Follow-up sodium level.    Discharge Exam:    Blood pressure 127/60, pulse 89, temperature 98.2 F (36.8 C), temperature source Oral, resp. rate 16, height '5\' 7"'  (1.702 m), weight 70.308 kg (155 lb), SpO2 92 %.   General exam: Appears calm and comfortable  Respiratory system: Clear to auscultation. Respiratory effort normal.  Cardiovascular system: S1 & S2 heard, RRR. No JVD, murmurs, rubs, gallops or clicks. No pedal edema. Gastrointestinal system: Abdomen is nondistended, soft and nontender. No organomegaly or masses felt. Normal bowel sounds heard. Central nervous system: Alert and oriented. No focal neurological deficits. Extremities: Symmetric 5 x 5 power. Skin: No rashes, lesions or ulcers Psychiatry: Judgement and insight appear normal. Mood & affect appropriate.    Follow-up Information    Follow up with Primary care provider. Schedule an appointment as soon as possible for a visit in 1 week.   Why:  Hospital follow-up      Signed: Jaisen Wiltrout 01/14/2016, 10:30 AM        Time spent >45 mins

## 2016-01-14 NOTE — Progress Notes (Signed)
Patient is discharging home today; he goes to the [facility] for follow up care Dr Vickii Chafe Cassidia [phone]) patient stated that he will see his PCP within one week and he also gets all of his medication through the [facility]. Mindi Slicker [facility] [phone]

## 2016-01-14 NOTE — Progress Notes (Signed)
Pt has orders to be discharged. Discharge instructions given and pt has no additional questions at this time. Medication regimen reviewed and pt educated. Pt verbalized understanding and has no additional questions. Telemetry box removed. IV removed and site in good condition. Pt stable and waiting for transportation. 

## 2016-01-17 LAB — CULTURE, BLOOD (ROUTINE X 2)
Culture: NO GROWTH
Culture: NO GROWTH

## 2016-01-18 ENCOUNTER — Encounter (HOSPITAL_COMMUNITY)
Admission: RE | Admit: 2016-01-18 | Discharge: 2016-01-18 | Disposition: A | Discharge status: Home / Self Care | Payer: Medicare Other | Source: Ambulatory Visit | Attending: Cardiovascular Disease | Admitting: Cardiovascular Disease

## 2016-01-18 ENCOUNTER — Encounter (HOSPITAL_COMMUNITY): Payer: Self-pay

## 2016-01-18 VITALS — BP 92/48 | HR 108 | Ht 68.0 in | Wt 158.5 lb

## 2016-01-18 DIAGNOSIS — Z951 Presence of aortocoronary bypass graft: Secondary | ICD-10-CM | POA: Diagnosis not present

## 2016-01-18 NOTE — Progress Notes (Signed)
Cardiac/Pulmonary Rehab Medication Review by a Pharmacist  Does the patient  feel that his/her medications are working for him/her?  yes  Has the patient been experiencing any side effects to the medications prescribed?  no  Does the patient measure his/her own blood pressure or blood glucose at home?  yes   Does the patient have any problems obtaining medications due to transportation or finances?   no  Understanding of regimen: excellent Understanding of indications: excellent Potential of compliance: excellent  Pharmacist comments: Reviewed medications with patient. He monitors his blood pressures and blood sugars. He has noticed yesterday that his SBP was <100, but heart rate elevated. Upon discharge from [facility], lisinopril was not on med list, but he takes. The metoprolol dose was increased recently. Also, patient taking protonix for reflux but no longer has reflux since bypass. Consider discontinuation of protonix.  Patient will follow up with PCP with blood pressure readings and Heart rate.   Thanks for allowing me to participate in the care of this patient,   Isac Sarna, BS [place], [place] Pharmacist Pager [phone] 01/18/2016 3:13 PM

## 2016-01-18 NOTE — Progress Notes (Signed)
Cardiac Individual Treatment Plan  Patient Details  Name: [name] MRN: [id number] Date of Birth: September 03, 1945 Referring Provider:        CARDIAC REHAB PHASE II ORIENTATION from 01/18/2016 in [facility]   Referring Provider  Dr. Gwenlyn Found [Dr. Gwenlyn Found ]      Initial Encounter Date:       CARDIAC REHAB [facility] from 01/18/2016 in [facility]   Date  01/18/16   Referring Provider  Dr. Gwenlyn Found [Dr. Gwenlyn Found ]      Visit Diagnosis: Status post coronary artery bypass grafts x 5  Patient's Home Medications on Admission:  Current outpatient prescriptions:  .  amLODipine (NORVASC) 10 MG tablet, Take 0.5 tablets (5 mg total) by mouth daily., Disp: 30 tablet, Rfl: 1 .  aspirin 81 MG tablet, Take 81 mg by mouth daily., Disp: , Rfl:  .  Cholecalciferol (VITAMIN D) 2000 UNITS tablet, Take 2,000 Units by mouth daily., Disp: , Rfl:  .  clopidogrel (PLAVIX) 75 MG tablet, Take 75 mg by mouth daily with breakfast., Disp: , Rfl:  .  ferrous sulfate 325 (65 FE) MG tablet, Take 325-650 mg by mouth 2 (two) times daily with a meal. 650mg  in the morning and 325mg  in the evening, Disp: , Rfl:  .  gabapentin (NEURONTIN) 300 MG capsule, Take 300 mg by mouth 3 (three) times daily. , Disp: , Rfl:  .  glipiZIDE (GLUCOTROL) 5 MG tablet, Take 10 mg by mouth 2 (two) times daily before a meal. , Disp: , Rfl:  .  levofloxacin (LEVAQUIN) 500 MG tablet, Take 1 tablet (500 mg total) by mouth daily., Disp: 5 tablet, Rfl: 0 .  lisinopril (PRINIVIL,ZESTRIL) 5 MG tablet, , Disp: , Rfl:  .  metFORMIN (GLUCOPHAGE) 1000 MG tablet, Take 1,000 mg by mouth 2 (two) times daily with a meal., Disp: , Rfl:  .  metoprolol tartrate (LOPRESSOR) 25 MG tablet, Take 1 tablet (25 mg total) by mouth 2 (two) times daily., Disp: 60 tablet, Rfl: 1 .  oxyCODONE (OXY IR/ROXICODONE) 5 MG immediate release tablet, Take 1-2 tablets (5-10 mg total) by mouth every 4 (four) hours as needed for severe pain.  (Patient not taking: Reported on 11/16/2015), Disp: 30 tablet, Rfl: 0 .  pantoprazole (PROTONIX) 40 MG tablet, Take 40 mg by mouth daily., Disp: , Rfl:  .  simvastatin (ZOCOR) 40 MG tablet, Take 20 mg by mouth at bedtime. , Disp: , Rfl:   Past Medical History: Past Medical History  Diagnosis Date  . Hypertension   . Hypercholesteremia   . GERD (gastroesophageal reflux disease)   . Diabetes mellitus without complication ([place])   . Neuropathy ([place])   . Peripheral vascular disease ([place])     s/p R-L FEM-FEM BYPASS  . CAD S/P percutaneous coronary angioplasty 1998    PCI TO CX  . 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 Former Smoker -- 2.00 packs/day for 30 years  . Types: Cigarettes  . Quit date: 08/14/2000  Smokeless tobacco  . Not on file    Labs:     Recent Review Flowsheet Data    Labs for ITP Cardiac and Pulmonary Rehab Latest Ref Rng 10/18/2015 10/18/2015 10/18/2015 10/19/2015 01/13/2016   Hemoglobin A1c 4.8 - 5.6 % - - - - 8.4(H)   PHART 7.350 - 7.450 7.433 7.364 - - -   PCO2ART 35.0 - 45.0 mmHg  36.9 39.6 - - -   HCO3 20.0 - 24.0 mEq/L 24.7(H) 22.6 - - -   TCO2 0 - 100 mmol/L 26 24 24 24  -   ACIDBASEDEF 0.0 - 2.0 mmol/L - 3.0(H) - - -   O2SAT - 99.0 96.0 - - -      Capillary Blood Glucose: Lab Results  Component Value Date   GLUCAP 126* 01/14/2016   GLUCAP 157* 01/13/2016   GLUCAP 105* 01/13/2016   GLUCAP 219* 01/13/2016   GLUCAP 138* 01/13/2016     Exercise Target Goals: Date: 01/18/16  Exercise Program Goal: Individual exercise prescription set with THRR, safety & activity barriers. Participant demonstrates ability to understand and report RPE using BORG scale, to self-measure pulse accurately, and to acknowledge the importance of the exercise prescription.  Exercise Prescription Goal: Starting with aerobic activity 30 plus minutes a day, 3 days per week for initial exercise prescription. Provide home exercise prescription and  guidelines that participant acknowledges understanding prior to discharge.  Activity Barriers & Risk Stratification:     Activity Barriers & Cardiac Risk Stratification - 01/18/16 1739    Activity Barriers & Cardiac Risk Stratification   Activity Barriers Balance Concerns  Neuropathy resulting in (R) leg pain and weakness   Cardiac Risk Stratification High      6 Minute Walk:     6 Minute Walk      01/19/16 1336       6 Minute Walk   Phase Initial     Distance 900 feet     Walk Time 6 minutes     # of Rest Breaks 0     MPH 1.7     METS 2.3     RPE 15     Perceived Dyspnea  9     VO2 Peak 9.01     Symptoms Yes (comment)     Comments Patient c/o pain at end of the test his legs, more in (R) leg, 8/10.  After a 2 minute rest period, pain was 2/10. Pain was gone before patient left at the end of orientation.      Resting HR 102 bpm     Resting BP 92/48 mmHg     Max Ex. HR 91 bpm     Max Ex. BP 150/40 mmHg     2 Minute Post BP 92/40 mmHg        Initial Exercise Prescription:     Initial Exercise Prescription - 01/19/16 1300    Date of Initial Exercise RX and Referring Provider   Date 01/18/16   Referring Provider Dr. Gwenlyn Found  Dr. Gwenlyn Found    Treadmill   MPH 1.3   Grade 0   Minutes 15   METs 1.9   NuStep   Level 2   Watts 18   Minutes 15   METs 1.9   Arm Ergometer   Level 2   Watts 18   Minutes 15   METs 2.3   Prescription Details   Frequency (times per week) 3   Duration Progress to 45 minutes of aerobic exercise without signs/symptoms of physical distress   Intensity   THRR REST +  30   THRR 40-80% of Max Heartrate [phone]   Ratings of Perceived Exertion 11-13   Perceived Dyspnea 0-4   Progression   Progression Continue to progress workloads to maintain intensity without signs/symptoms of physical distress.   Resistance Training   Training Prescription Yes   Weight 1   Reps 10-12  Perform Capillary Blood Glucose checks as  needed.  Exercise Prescription Changes:   Exercise Comments:    Discharge Exercise Prescription (Final Exercise Prescription Changes):   Nutrition:  Target Goals: Understanding of nutrition guidelines, daily intake of sodium 1500mg , cholesterol 200mg , calories 30% from fat and 7% or less from saturated fats, daily to have 5 or more servings of fruits and vegetables.  Biometrics:     Pre Biometrics - 01/19/16 1343    Pre Biometrics   Height 5\' 8"  (1.727 m)   Weight 158 lb 8.2 oz (71.9 kg)   Waist Circumference 41 inches   Hip Circumference 37.5 inches   Waist to Hip Ratio 1.09 %   BMI (Calculated) 24.2   Triceps Skinfold 11 mm   % Body Fat 25.5 %   Grip Strength 74 kg   Flexibility 16 in   Single Leg Stand 7 seconds       Nutrition Therapy Plan and Nutrition Goals:   Nutrition Discharge: Rate Your Plate Scores:     Nutrition Assessments - 01/18/16 1742    MEDFICTS Scores   Pre Score 39      Nutrition Goals Re-Evaluation:   Psychosocial: Target Goals: Acknowledge presence or absence of depression, maximize coping skills, provide positive support system. Participant is able to verbalize types and ability to use techniques and skills needed for reducing stress and depression.  Initial Review & Psychosocial Screening:     Initial Psych Review & Screening - 01/18/16 1752    Initial Review   Current issues with --  None   Family Dynamics   Good Support System? Yes   Barriers   Psychosocial barriers to participate in program There are no identifiable barriers or psychosocial needs.   Screening Interventions   Interventions Encouraged to exercise      Quality of Life Scores:     Quality of Life - 01/19/16 1345    Quality of Life Scores   Health/Function Pre 30 %   Socioeconomic Pre 30 %   Psych/Spiritual Pre 30 %   Family Pre 30 %   GLOBAL Pre 30 %      PHQ-9:     Recent Review Flowsheet Data    Depression screen [facility] 2/9 01/18/2016    Decreased Interest 0   Down, Depressed, Hopeless 0   PHQ - 2 Score 0   Altered sleeping 0   Tired, decreased energy 0   Change in appetite 2   Feeling bad or failure about yourself  0   Trouble concentrating 0   Moving slowly or fidgety/restless 0   Suicidal thoughts 0   PHQ-9 Score 2   Difficult doing work/chores Not difficult at all      Psychosocial Evaluation and Intervention:   Psychosocial Re-Evaluation:   Vocational Rehabilitation: Provide vocational rehab assistance to qualifying candidates.   Vocational Rehab Evaluation & Intervention:     Vocational Rehab - 01/18/16 1741    Initial Vocational Rehab Evaluation & Intervention   Assessment shows need for Vocational Rehabilitation No      Education: Education Goals: Education classes will be provided on a weekly basis, covering required topics. Participant will state understanding/return demonstration of topics presented.  Learning Barriers/Preferences:     Learning Barriers/Preferences - 01/18/16 1740    Learning Barriers/Preferences   Learning Barriers Reading  Does not read well   Learning Preferences Video;Skilled Demonstration      Education Topics: Hypertension, Hypertension Reduction -Define heart disease and high blood pressure. Discus how  high blood pressure affects the body and ways to reduce high blood pressure.   Exercise and Your Heart -Discuss why it is important to exercise, the FITT principles of exercise, normal and abnormal responses to exercise, and how to exercise safely.   Angina -Discuss definition of angina, causes of angina, treatment of angina, and how to decrease risk of having angina.   Cardiac Medications -Review what the following cardiac medications are used for, how they affect the body, and side effects that may occur when taking the medications.  Medications include Aspirin, Beta blockers, calcium channel blockers, ACE Inhibitors, angiotensin receptor blockers,  diuretics, digoxin, and antihyperlipidemics.   Congestive Heart Failure -Discuss the definition of CHF, how to live with CHF, the signs and symptoms of CHF, and how keep track of weight and sodium intake.   Heart Disease and Intimacy -Discus the effect sexual activity has on the heart, how changes occur during intimacy as we age, and safety during sexual activity.   Smoking Cessation / COPD -Discuss different methods to quit smoking, the health benefits of quitting smoking, and the definition of COPD.   Nutrition I: Fats -Discuss the types of cholesterol, what cholesterol does to the heart, and how cholesterol levels can be controlled.   Nutrition II: Labels -Discuss the different components of food labels and how to read food label   Heart Parts and Heart Disease -Discuss the anatomy of the heart, the pathway of blood circulation through the heart, and these are affected by heart disease.   Stress I: Signs and Symptoms -Discuss the causes of stress, how stress may lead to anxiety and depression, and ways to limit stress.   Stress II: Relaxation -Discuss different types of relaxation techniques to limit stress.   Warning Signs of Stroke / TIA -Discuss definition of a stroke, what the signs and symptoms are of a stroke, and how to identify when someone is having stroke.   Knowledge Questionnaire Score:     Knowledge Questionnaire Score - 01/18/16 1741    Knowledge Questionnaire Score   Pre Score 17/24      Core Components/Risk Factors/Patient Goals at Admission:     Personal Goals and Risk Factors at Admission - 01/18/16 1742    Core Components/Risk Factors/Patient Goals on Admission    Weight Management Yes   Intervention Weight Management/Obesity: Establish reasonable short term and long term weight goals.   Admit Weight 158 lb 9.6 oz (71.94 kg)   Goal Weight: Short Term 153 lb 9.6 oz (69.673 kg)   Goal Weight: Long Term 148 lb 9.6 oz (67.405 kg)   Expected  Outcomes Short Term: Continue to assess and modify interventions until short term weight is achieved;Long Term: Adherence to nutrition and physical activity/exercise program aimed toward attainment of established weight goal   Sedentary Yes   Intervention Provide advice, education, support and counseling about physical activity/exercise needs.;Develop an individualized exercise prescription for aerobic and resistive training based on initial evaluation findings, risk stratification, comorbidities and participant's personal goals.   Expected Outcomes Achievement of increased cardiorespiratory fitness and enhanced flexibility, muscular endurance and strength shown through measurements of functional capacity and personal statement of participant.   Increase Strength and Stamina Yes   Intervention Provide advice, education, support and counseling about physical activity/exercise needs.;Develop an individualized exercise prescription for aerobic and resistive training based on initial evaluation findings, risk stratification, comorbidities and participant's personal goals.   Diabetes Yes   Expected Outcomes Short Term: Participant verbalizes understanding of the signs/symptoms  and immediate care of hyper/hypoglycemia, proper foot care and importance of medication, aerobic/resistive exercise and nutrition plan for blood glucose control.;Long Term: Attainment of HbA1C < 7%.   Personal Goal Other Yes   Personal Goal Get stronger, strengthen legs, lose about 5-10lbs.    Intervention Attend regular 3xweek and supplement 2 more days at home   Expected Outcomes Get stronger and lose weight before graduation from program in 12 weeks.       Core Components/Risk Factors/Patient Goals Review:    Core Components/Risk Factors/Patient Goals at Discharge (Final Review):    ITP Comments:   Comments: Patient arrived for 1st visit/orientation/education at 2:15pm. Patient was referred to CR by Dr. Gwenlyn Found due to  CABGx5 (Z95.1). During orientation advised patient on arrival and appointment times what to wear, what to do before, during and after exercise. Reviewed attendance and class policy. Talked about inclement weather and class consultation policy. Pt is scheduled to return Cardiac Rehab on 01/24/16 at 0930. Pt was advised to come to class 15 minutes before class starts. He was also given instructions on meeting with the dietician and attending the Family Structure classes. Pt is eager to get started. Patient was able to complete 6 minute walk test. Patient was measured for the equipment. Discussed equipment safety with patient. Took patient pre-anthropometric measurements. Patient finished visit at 4:30pm.

## 2016-01-24 ENCOUNTER — Encounter (HOSPITAL_COMMUNITY)
Admission: RE | Admit: 2016-01-24 | Discharge: 2016-01-24 | Disposition: A | Discharge status: Home / Self Care | Payer: Medicare Other | Source: Ambulatory Visit | Attending: Cardiovascular Disease | Admitting: Cardiovascular Disease

## 2016-01-24 DIAGNOSIS — Z951 Presence of aortocoronary bypass graft: Secondary | ICD-10-CM | POA: Diagnosis not present

## 2016-01-24 NOTE — Progress Notes (Signed)
Daily Session Note  Patient Details  Name: MAYES SANGIOVANNI MRN: [id number] Date of Birth: 1946-05-16 Referring Provider:        CARDIAC REHAB PHASE II ORIENTATION from 01/18/2016 in [facility]   Referring Provider  Dr. Clent Ridges ]      Encounter Date: 01/24/2016  Check In:     Session Check In - 01/24/16 0930    Check-In   Location [facility]   Staff Present Diane Angelina Pih, MS, EP, [facility], Exercise Physiologist;Kalina Morabito Luther Parody, BS, EP, Exercise Physiologist;Christy Oletta Lamas, RN, BSN   Supervising physician immediately available to respond to emergencies See telemetry face sheet for immediately available MD   Medication changes reported     No   Fall or balance concerns reported    No   Warm-up and Cool-down Performed as group-led instruction   Resistance Training Performed Yes   VAD Patient? No   Pain Assessment   Currently in Pain? No/denies   Pain Score 0-No pain   Multiple Pain Sites No      Capillary Blood Glucose: No results found for this or any previous visit (from the past 24 hour(s)).   Goals Met:  Independence with exercise equipment Exercise tolerated well No report of cardiac concerns or symptoms Strength training completed today  Goals Unmet:  Not Applicable  Comments: Check out 1030   Dr. Kate Sable is Medical Director for [place] and Pulmonary Rehab.

## 2016-01-26 ENCOUNTER — Encounter (HOSPITAL_COMMUNITY)
Admission: RE | Admit: 2016-01-26 | Discharge: 2016-01-26 | Disposition: A | Discharge status: Home / Self Care | Payer: Medicare Other | Source: Ambulatory Visit | Attending: Cardiovascular Disease | Admitting: Cardiovascular Disease

## 2016-01-26 DIAGNOSIS — Z951 Presence of aortocoronary bypass graft: Secondary | ICD-10-CM | POA: Diagnosis not present

## 2016-01-26 NOTE — Progress Notes (Signed)
Daily Session Note  Patient Details  Name: [name] MRN: [id number] Date of Birth: 12-10-1945 Referring Provider:        CARDIAC REHAB PHASE II ORIENTATION from 01/18/2016 in [place]   Referring Provider  Dr. Clent Ridges ]      Encounter Date: 01/26/2016  Check In:     Session Check In - 01/26/16 0930    Check-In   Location [facility]   Staff Present Ambrose Wile Angelina Pih, MS, EP, [facility], Exercise Physiologist;Gregory Luther Parody, BS, EP, Exercise Physiologist;Christy Oletta Lamas, RN, BSN   Supervising physician immediately available to respond to emergencies See telemetry face sheet for immediately available MD   Medication changes reported     No   Fall or balance concerns reported    No   Warm-up and Cool-down Performed as group-led instruction   Resistance Training Performed Yes   VAD Patient? No   Pain Assessment   Pain Score 0-No pain   Multiple Pain Sites No      Capillary Blood Glucose: No results found for this or any previous visit (from the past 24 hour(s)).   Goals Met:  Independence with exercise equipment Exercise tolerated well No report of cardiac concerns or symptoms Strength training completed today  Goals Unmet:  Not Applicable  Comments: Check out: 1030   Dr. Kate Sable is Medical Director for [place] and Pulmonary Rehab.

## 2016-01-28 ENCOUNTER — Encounter (HOSPITAL_COMMUNITY)
Admission: RE | Admit: 2016-01-28 | Discharge: 2016-01-28 | Disposition: A | Discharge status: Home / Self Care | Payer: Medicare Other | Source: Ambulatory Visit | Attending: Cardiovascular Disease | Admitting: Cardiovascular Disease

## 2016-01-28 DIAGNOSIS — Z951 Presence of aortocoronary bypass graft: Secondary | ICD-10-CM | POA: Diagnosis not present

## 2016-01-28 NOTE — Progress Notes (Signed)
Daily Session Note  Patient Details  Name: ISAC LINCKS MRN: [id number] Date of Birth: [birth year] Referring Provider:        CARDIAC REHAB PHASE II ORIENTATION from 01/18/2016 in [place]   Referring Provider  Dr. Clent Ridges ]      Encounter Date: 01/28/2016  Check In:     Session Check In - 01/28/16 0930    Check-In   Location [facility]   Staff Present Diane Angelina Pih, MS, EP, [facility], Exercise Physiologist;Bianco Cange Luther Parody, BS, EP, Exercise Physiologist;Christy Oletta Lamas, RN, BSN   Supervising physician immediately available to respond to emergencies See telemetry face sheet for immediately available MD   Medication changes reported     No   Fall or balance concerns reported    No   Warm-up and Cool-down Performed as group-led instruction   Resistance Training Performed Yes   VAD Patient? No   Pain Assessment   Currently in Pain? No/denies   Pain Score 0-No pain   Multiple Pain Sites No      Capillary Blood Glucose: No results found for this or any previous visit (from the past 24 hour(s)).   Goals Met:  Independence with exercise equipment Exercise tolerated well No report of cardiac concerns or symptoms Strength training completed today  Goals Unmet:  Not Applicable  Comments: Check out 1030   Dr. Kate Sable is Medical Director for [place] and Pulmonary Rehab.

## 2016-01-31 ENCOUNTER — Encounter (HOSPITAL_COMMUNITY)
Admission: RE | Admit: 2016-01-31 | Discharge: 2016-01-31 | Disposition: A | Discharge status: Home / Self Care | Payer: Medicare Other | Source: Ambulatory Visit | Attending: Cardiovascular Disease | Admitting: Cardiovascular Disease

## 2016-01-31 DIAGNOSIS — Z951 Presence of aortocoronary bypass graft: Secondary | ICD-10-CM | POA: Diagnosis not present

## 2016-01-31 NOTE — Progress Notes (Signed)
Daily Session Note  Patient Details  Name: [name] MRN: [id number] Date of Birth: 06/20/1946 Referring Provider:        CARDIAC REHAB PHASE II ORIENTATION from 01/18/2016 in [place]   Referring Provider  Dr. Clent Ridges ]      Encounter Date: 01/31/2016  Check In:     Session Check In - 01/31/16 0930    Check-In   Location [facility]   Staff Present Diane Angelina Pih, MS, EP, [facility], Exercise Physiologist;Anjani Feuerborn Luther Parody, BS, EP, Exercise Physiologist;Christy Oletta Lamas, RN, BSN   Supervising physician immediately available to respond to emergencies See telemetry face sheet for immediately available MD   Medication changes reported     No   Fall or balance concerns reported    No   Warm-up and Cool-down Performed as group-led instruction   Resistance Training Performed Yes   VAD Patient? No   Pain Assessment   Currently in Pain? No/denies   Pain Score 0-No pain   Multiple Pain Sites No      Capillary Blood Glucose: No results found for this or any previous visit (from the past 24 hour(s)).   Goals Met:  Independence with exercise equipment Exercise tolerated well No report of cardiac concerns or symptoms Strength training completed today  Goals Unmet:  Not Applicable  Comments: Check out 1030   Dr. Kate Sable is Medical Director for [place] and Pulmonary Rehab.

## 2016-02-02 ENCOUNTER — Encounter (HOSPITAL_COMMUNITY)
Admission: RE | Admit: 2016-02-02 | Discharge: 2016-02-02 | Disposition: A | Discharge status: Home / Self Care | Payer: Medicare Other | Source: Ambulatory Visit | Attending: Cardiovascular Disease | Admitting: Cardiovascular Disease

## 2016-02-02 DIAGNOSIS — Z951 Presence of aortocoronary bypass graft: Secondary | ICD-10-CM | POA: Diagnosis not present

## 2016-02-02 NOTE — Progress Notes (Signed)
Daily Session Note  Patient Details  Name: GRAYSYN BACHE MRN: [id number] Date of Birth: [birth year] Referring Provider:        CARDIAC REHAB PHASE II ORIENTATION from 01/18/2016 in [place]   Referring Provider  Dr. Clent Ridges ]      Encounter Date: 02/02/2016  Check In:     Session Check In - 02/02/16 0930    Check-In   Location [facility]   Staff Present Russella Dar, MS, EP, [facility], Exercise Physiologist;Shyvonne Chastang Luther Parody, BS, EP, Exercise Physiologist   Supervising physician immediately available to respond to emergencies See telemetry face sheet for immediately available MD   Medication changes reported     No   Fall or balance concerns reported    No   Warm-up and Cool-down Performed as group-led instruction   Resistance Training Performed Yes   VAD Patient? No   Pain Assessment   Currently in Pain? No/denies   Pain Score 0-No pain   Multiple Pain Sites No      Capillary Blood Glucose: No results found for this or any previous visit (from the past 24 hour(s)).   Goals Met:  Independence with exercise equipment Exercise tolerated well No report of cardiac concerns or symptoms Strength training completed today  Goals Unmet:  Not Applicable  Comments: Check out 1030   Dr. Kate Sable is Medical Director for [place] and Pulmonary Rehab.

## 2016-02-04 ENCOUNTER — Encounter (HOSPITAL_COMMUNITY)
Admission: RE | Admit: 2016-02-04 | Discharge: 2016-02-04 | Disposition: A | Discharge status: Home / Self Care | Payer: Medicare Other | Source: Ambulatory Visit | Attending: Cardiovascular Disease | Admitting: Cardiovascular Disease

## 2016-02-04 DIAGNOSIS — Z951 Presence of aortocoronary bypass graft: Secondary | ICD-10-CM | POA: Diagnosis not present

## 2016-02-04 NOTE — Progress Notes (Signed)
Daily Session Note  Patient Details  Name: RAESHAWN VO MRN: [id number] Date of Birth: 1945/09/16 Referring Provider:        CARDIAC REHAB PHASE II ORIENTATION from 01/18/2016 in [place]   Referring Provider  Dr. Clent Ridges ]      Encounter Date: 02/04/2016  Check In:     Session Check In - 02/04/16 0930    Check-In   Location [facility]   Staff Present Russella Dar, MS, EP, [facility], Exercise Physiologist;Stevana Dufner Luther Parody, BS, EP, Exercise Physiologist   Supervising physician immediately available to respond to emergencies See telemetry face sheet for immediately available MD   Medication changes reported     No   Fall or balance concerns reported    No   Warm-up and Cool-down Performed as group-led instruction   Resistance Training Performed Yes   VAD Patient? No   Pain Assessment   Currently in Pain? No/denies   Pain Score 0-No pain   Multiple Pain Sites No      Capillary Blood Glucose: No results found for this or any previous visit (from the past 24 hour(s)).   Goals Met:  Independence with exercise equipment Exercise tolerated well No report of cardiac concerns or symptoms  Goals Unmet:  Not Applicable  Comments: Check out 1030   Dr. Kate Sable is Medical Director for [place] and Pulmonary Rehab.

## 2016-02-07 ENCOUNTER — Encounter (HOSPITAL_COMMUNITY)
Admission: RE | Admit: 2016-02-07 | Discharge: 2016-02-07 | Disposition: A | Discharge status: Home / Self Care | Payer: Medicare Other | Source: Ambulatory Visit | Attending: Cardiovascular Disease | Admitting: Cardiovascular Disease

## 2016-02-07 DIAGNOSIS — Z951 Presence of aortocoronary bypass graft: Secondary | ICD-10-CM | POA: Diagnosis not present

## 2016-02-07 NOTE — Progress Notes (Signed)
Cardiac Individual Treatment Plan  Patient Details  Name: [name] MRN: [id number] Date of Birth: [birth year] Referring Provider:        CARDIAC REHAB PHASE II ORIENTATION from 01/18/2016 in [place]   Referring Provider  Dr. Gwenlyn Found [Dr. Gwenlyn Found ]      Initial Encounter Date:       CARDIAC REHAB [place] from 01/18/2016 in [place]   Date  01/18/16   Referring Provider  Dr. Gwenlyn Found [Dr. Gwenlyn Found ]      Visit Diagnosis: S/P CABG x 5  Patient's Home Medications on Admission:  Current outpatient prescriptions:  .  amLODipine (NORVASC) 10 MG tablet, Take 0.5 tablets (5 mg total) by mouth daily., Disp: 30 tablet, Rfl: 1 .  aspirin 81 MG tablet, Take 81 mg by mouth daily., Disp: , Rfl:  .  Cholecalciferol (VITAMIN D) 2000 UNITS tablet, Take 2,000 Units by mouth daily., Disp: , Rfl:  .  clopidogrel (PLAVIX) 75 MG tablet, Take 75 mg by mouth daily with breakfast., Disp: , Rfl:  .  ferrous sulfate 325 (65 FE) MG tablet, Take 325-650 mg by mouth 2 (two) times daily with a meal. 650mg  in the morning and 325mg  in the evening, Disp: , Rfl:  .  gabapentin (NEURONTIN) 300 MG capsule, Take 300 mg by mouth 3 (three) times daily. , Disp: , Rfl:  .  glipiZIDE (GLUCOTROL) 5 MG tablet, Take 10 mg by mouth 2 (two) times daily before a meal. , Disp: , Rfl:  .  levofloxacin (LEVAQUIN) 500 MG tablet, Take 1 tablet (500 mg total) by mouth daily., Disp: 5 tablet, Rfl: 0 .  lisinopril (PRINIVIL,ZESTRIL) 5 MG tablet, , Disp: , Rfl:  .  metFORMIN (GLUCOPHAGE) 1000 MG tablet, Take 1,000 mg by mouth 2 (two) times daily with a meal., Disp: , Rfl:  .  metoprolol tartrate (LOPRESSOR) 25 MG tablet, Take 1 tablet (25 mg total) by mouth 2 (two) times daily., Disp: 60 tablet, Rfl: 1 .  oxyCODONE (OXY IR/ROXICODONE) 5 MG immediate release tablet, Take 1-2 tablets (5-10 mg total) by mouth every 4 (four) hours as needed for severe pain. (Patient not taking: Reported on  11/16/2015), Disp: 30 tablet, Rfl: 0 .  pantoprazole (PROTONIX) 40 MG tablet, Take 40 mg by mouth daily., Disp: , Rfl:  .  simvastatin (ZOCOR) 40 MG tablet, Take 20 mg by mouth at bedtime. , Disp: , Rfl:   Past Medical History: Past Medical History  Diagnosis Date  . Hypertension   . Hypercholesteremia   . GERD (gastroesophageal reflux disease)   . Diabetes mellitus without complication ([place])   . Neuropathy ([facility])   . Peripheral vascular disease ([place])     s/p R-L FEM-FEM BYPASS  . CAD S/P percutaneous coronary angioplasty 1998    PCI TO CX  . 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 Former Smoker -- 2.00 packs/day for 30 years  . Types: Cigarettes  . Quit date: 08/14/2000  Smokeless tobacco  . Not on file    Labs:     Recent Review Flowsheet Data    Labs for ITP Cardiac and Pulmonary Rehab Latest Ref Rng 10/18/2015 10/18/2015 10/18/2015 10/19/2015 01/13/2016   Hemoglobin A1c 4.8 - 5.6 % - - - - 8.4(H)   PHART 7.350 - 7.450 7.433 7.364 - - -   PCO2ART 35.0 - 45.0 mmHg 36.9 39.6 - - -  HCO3 20.0 - 24.0 mEq/L 24.7(H) 22.6 - - -   TCO2 0 - 100 mmol/L 26 24 24 24  -   ACIDBASEDEF 0.0 - 2.0 mmol/L - 3.0(H) - - -   O2SAT - 99.0 96.0 - - -      Capillary Blood Glucose: Lab Results  Component Value Date   GLUCAP 126* 01/14/2016   GLUCAP 157* 01/13/2016   GLUCAP 105* 01/13/2016   GLUCAP 219* 01/13/2016   GLUCAP 138* 01/13/2016     Exercise Target Goals:    Exercise Program Goal: Individual exercise prescription set with THRR, safety & activity barriers. Participant demonstrates ability to understand and report RPE using BORG scale, to self-measure pulse accurately, and to acknowledge the importance of the exercise prescription.  Exercise Prescription Goal: Starting with aerobic activity 30 plus minutes a day, 3 days per week for initial exercise prescription. Provide home exercise prescription and guidelines that participant acknowledges  understanding prior to discharge.  Activity Barriers & Risk Stratification:     Activity Barriers & Cardiac Risk Stratification - 01/18/16 1739    Activity Barriers & Cardiac Risk Stratification   Activity Barriers Balance Concerns  Neuropathy resulting in (R) leg pain and weakness   Cardiac Risk Stratification High      6 Minute Walk:     6 Minute Walk      01/19/16 1336       6 Minute Walk   Phase Initial     Distance 900 feet     Walk Time 6 minutes     # of Rest Breaks 0     MPH 1.7     METS 2.3     RPE 15     Perceived Dyspnea  9     VO2 Peak 9.01     Symptoms Yes (comment)     Comments Patient c/o pain at end of the test his legs, more in (R) leg, 8/10.  After a 2 minute rest period, pain was 2/10. Pain was gone before patient left at the end of orientation.      Resting HR 102 bpm     Resting BP 92/48 mmHg     Max Ex. HR 91 bpm     Max Ex. BP 150/40 mmHg     2 Minute Post BP 92/40 mmHg        Initial Exercise Prescription:     Initial Exercise Prescription - 01/19/16 1300    Date of Initial Exercise RX and Referring Provider   Date 01/18/16   Referring Provider Dr. Gwenlyn Found  Dr. Gwenlyn Found    Treadmill   MPH 1.3   Grade 0   Minutes 15   METs 1.9   NuStep   Level 2   Watts 18   Minutes 15   METs 1.9   Arm Ergometer   Level 2   Watts 18   Minutes 15   METs 2.3   Prescription Details   Frequency (times per week) 3   Duration Progress to 45 minutes of aerobic exercise without signs/symptoms of physical distress   Intensity   THRR REST +  30   THRR 40-80% of Max Heartrate [phone]   Ratings of Perceived Exertion 11-13   Perceived Dyspnea 0-4   Progression   Progression Continue to progress workloads to maintain intensity without signs/symptoms of physical distress.   Resistance Training   Training Prescription Yes   Weight 1   Reps 10-12      Perform Capillary Blood  Glucose checks as needed.  Exercise Prescription Changes:       Exercise Prescription Changes      02/07/16 1800           Exercise Review   Progression Yes       Response to Exercise   Blood Pressure (Admit) 110/50 mmHg       Blood Pressure (Exercise) 142/40 mmHg       Blood Pressure (Exit) 120/42 mmHg       Heart Rate (Admit) 79 bpm       Heart Rate (Exercise) 109 bpm       Heart Rate (Exit) 81 bpm       Rating of Perceived Exertion (Exercise) 11       Comments Have not put him on TM yet. Strengthening his legs for a few more sessions and will then transition him to the TM.       Duration Progress to 30 minutes of continuous aerobic without signs/symptoms of physical distress       Intensity Rest + 30       Resistance Training   Training Prescription Yes       Weight 2       Reps 10-12       Treadmill   MPH 0       Grade 0       Minutes 0       METs 0       NuStep   Level 2       Watts 31       Minutes 15       METs 3.48       Arm Ergometer   Level 2       Watts 19       Minutes 20       METs 2.9       Home Exercise Plan   Plans to continue exercise at Home       Frequency Add 2 additional days to program exercise sessions.          Exercise Comments:    Discharge Exercise Prescription (Final Exercise Prescription Changes):     Exercise Prescription Changes - 02/07/16 1800    Exercise Review   Progression Yes   Response to Exercise   Blood Pressure (Admit) 110/50 mmHg   Blood Pressure (Exercise) 142/40 mmHg   Blood Pressure (Exit) 120/42 mmHg   Heart Rate (Admit) 79 bpm   Heart Rate (Exercise) 109 bpm   Heart Rate (Exit) 81 bpm   Rating of Perceived Exertion (Exercise) 11   Comments Have not put him on TM yet. Strengthening his legs for a few more sessions and will then transition him to the TM.   Duration Progress to 30 minutes of continuous aerobic without signs/symptoms of physical distress   Intensity Rest + 30   Resistance Training   Training Prescription Yes   Weight 2   Reps 10-12   Treadmill   MPH  0   Grade 0   Minutes 0   METs 0   NuStep   Level 2   Watts 31   Minutes 15   METs 3.48   Arm Ergometer   Level 2   Watts 19   Minutes 20   METs 2.9   Home Exercise Plan   Plans to continue exercise at Home   Frequency Add 2 additional days to program exercise sessions.      Nutrition:  Target Goals:  Understanding of nutrition guidelines, daily intake of sodium 1500mg , cholesterol 200mg , calories 30% from fat and 7% or less from saturated fats, daily to have 5 or more servings of fruits and vegetables.  Biometrics:     Pre Biometrics - 01/19/16 1343    Pre Biometrics   Height 5\' 8"  (1.727 m)   Weight 158 lb 8.2 oz (71.9 kg)   Waist Circumference 41 inches   Hip Circumference 37.5 inches   Waist to Hip Ratio 1.09 %   BMI (Calculated) 24.2   Triceps Skinfold 11 mm   % Body Fat 25.5 %   Grip Strength 74 kg   Flexibility 16 in   Single Leg Stand 7 seconds       Nutrition Therapy Plan and Nutrition Goals:   Nutrition Discharge: Rate Your Plate Scores:     Nutrition Assessments - 01/18/16 1742    MEDFICTS Scores   Pre Score 39      Nutrition Goals Re-Evaluation:   Psychosocial: Target Goals: Acknowledge presence or absence of depression, maximize coping skills, provide positive support system. Participant is able to verbalize types and ability to use techniques and skills needed for reducing stress and depression.  Initial Review & Psychosocial Screening:     Initial Psych Review & Screening - 01/18/16 1752    Initial Review   Current issues with --  None   Family Dynamics   Good Support System? Yes   Barriers   Psychosocial barriers to participate in program There are no identifiable barriers or psychosocial needs.   Screening Interventions   Interventions Encouraged to exercise      Quality of Life Scores:     Quality of Life - 01/19/16 1345    Quality of Life Scores   Health/Function Pre 30 %   Socioeconomic Pre 30 %   Psych/Spiritual  Pre 30 %   Family Pre 30 %   GLOBAL Pre 30 %      PHQ-9:     Recent Review Flowsheet Data    Depression screen [facility] 2/9 01/18/2016   Decreased Interest 0   Down, Depressed, Hopeless 0   PHQ - 2 Score 0   Altered sleeping 0   Tired, decreased energy 0   Change in appetite 2   Feeling bad or failure about yourself  0   Trouble concentrating 0   Moving slowly or fidgety/restless 0   Suicidal thoughts 0   PHQ-9 Score 2   Difficult doing work/chores Not difficult at all      Psychosocial Evaluation and Intervention:   Psychosocial Re-Evaluation:     Psychosocial Re-Evaluation      02/07/16 1839           Psychosocial Re-Evaluation   Interventions Encouraged to attend Cardiac Rehabilitation for the exercise       Continued Psychosocial Services Needed No          Vocational Rehabilitation: Provide vocational rehab assistance to qualifying candidates.   Vocational Rehab Evaluation & Intervention:     Vocational Rehab - 01/18/16 1741    Initial Vocational Rehab Evaluation & Intervention   Assessment shows need for Vocational Rehabilitation No      Education: Education Goals: Education classes will be provided on a weekly basis, covering required topics. Participant will state understanding/return demonstration of topics presented.  Learning Barriers/Preferences:     Learning Barriers/Preferences - 01/18/16 1740    Learning Barriers/Preferences   Learning Barriers Reading  Does not read well   Learning  Preferences Video;Skilled Demonstration      Education Topics: Hypertension, Hypertension Reduction -Define heart disease and high blood pressure. Discus how high blood pressure affects the body and ways to reduce high blood pressure.   Exercise and Your Heart -Discuss why it is important to exercise, the FITT principles of exercise, normal and abnormal responses to exercise, and how to exercise safely.   Angina -Discuss definition of angina, causes  of angina, treatment of angina, and how to decrease risk of having angina.   Cardiac Medications -Review what the following cardiac medications are used for, how they affect the body, and side effects that may occur when taking the medications.  Medications include Aspirin, Beta blockers, calcium channel blockers, ACE Inhibitors, angiotensin receptor blockers, diuretics, digoxin, and antihyperlipidemics.   Congestive Heart Failure -Discuss the definition of CHF, how to live with CHF, the signs and symptoms of CHF, and how keep track of weight and sodium intake.   Heart Disease and Intimacy -Discus the effect sexual activity has on the heart, how changes occur during intimacy as we age, and safety during sexual activity.   Smoking Cessation / COPD -Discuss different methods to quit smoking, the health benefits of quitting smoking, and the definition of COPD.      CARDIAC REHAB PHASE II EXERCISE from 02/02/2016 in [place]   Date  01/26/16   Educator  Russella Dar   Instruction Review Code  2- meets goals/outcomes      Nutrition I: Fats -Discuss the types of cholesterol, what cholesterol does to the heart, and how cholesterol levels can be controlled.          CARDIAC REHAB PHASE II EXERCISE from 02/02/2016 in [place]   Date  02/02/16   Educator  Russella Dar   Instruction Review Code  2- meets goals/outcomes      Nutrition II: Labels -Discuss the different components of food labels and how to read food label   Heart Parts and Heart Disease -Discuss the anatomy of the heart, the pathway of blood circulation through the heart, and these are affected by heart disease.   Stress I: Signs and Symptoms -Discuss the causes of stress, how stress may lead to anxiety and depression, and ways to limit stress.   Stress II: Relaxation -Discuss different types of relaxation techniques to limit stress.   Warning Signs of Stroke /  TIA -Discuss definition of a stroke, what the signs and symptoms are of a stroke, and how to identify when someone is having stroke.   Knowledge Questionnaire Score:     Knowledge Questionnaire Score - 01/18/16 1741    Knowledge Questionnaire Score   Pre Score 17/24      Core Components/Risk Factors/Patient Goals at Admission:     Personal Goals and Risk Factors at Admission - 01/18/16 1742    Core Components/Risk Factors/Patient Goals on Admission    Weight Management Yes   Intervention Weight Management/Obesity: Establish reasonable short term and long term weight goals.   Admit Weight 158 lb 9.6 oz (71.94 kg)   Goal Weight: Short Term 153 lb 9.6 oz (69.673 kg)   Goal Weight: Long Term 148 lb 9.6 oz (67.405 kg)   Expected Outcomes Short Term: Continue to assess and modify interventions until short term weight is achieved;Long Term: Adherence to nutrition and physical activity/exercise program aimed toward attainment of established weight goal   Sedentary Yes   Intervention Provide advice, education, support and counseling about physical activity/exercise  needs.;Develop an individualized exercise prescription for aerobic and resistive training based on initial evaluation findings, risk stratification, comorbidities and participant's personal goals.   Expected Outcomes Achievement of increased cardiorespiratory fitness and enhanced flexibility, muscular endurance and strength shown through measurements of functional capacity and personal statement of participant.   Increase Strength and Stamina Yes   Intervention Provide advice, education, support and counseling about physical activity/exercise needs.;Develop an individualized exercise prescription for aerobic and resistive training based on initial evaluation findings, risk stratification, comorbidities and participant's personal goals.   Diabetes Yes   Expected Outcomes Short Term: Participant verbalizes understanding of the  signs/symptoms and immediate care of hyper/hypoglycemia, proper foot care and importance of medication, aerobic/resistive exercise and nutrition plan for blood glucose control.;Long Term: Attainment of HbA1C < 7%.   Personal Goal Other Yes   Personal Goal Get stronger, strengthen legs, lose about 5-10lbs.    Intervention Attend regular 3xweek and supplement 2 more days at home   Expected Outcomes Get stronger and lose weight before graduation from program in 12 weeks.       Core Components/Risk Factors/Patient Goals Review:      Goals and Risk Factor Review      02/07/16 1838           Core Components/Risk Factors/Patient Goals Review   Personal Goals Review Increase Strength and Stamina;Weight Management/Obesity  Strengthen legs       Review Get stronger, strengthen legs and lose 5-10lbs before graduation       Expected Outcomes Gain strength and stamina, lose weight          Core Components/Risk Factors/Patient Goals at Discharge (Final Review):      Goals and Risk Factor Review - 02/07/16 1838    Core Components/Risk Factors/Patient Goals Review   Personal Goals Review Increase Strength and Stamina;Weight Management/Obesity  Strengthen legs   Review Get stronger, strengthen legs and lose 5-10lbs before graduation   Expected Outcomes Gain strength and stamina, lose weight      ITP Comments:   Comments: Patient is doing well with program. Will continue to monitor for progress.

## 2016-02-07 NOTE — Progress Notes (Signed)
Daily Session Note  Patient Details  Name: [name] MRN: [id number] Date of Birth: 29-Jun-1946 Referring Provider:        CARDIAC REHAB PHASE II ORIENTATION from 01/18/2016 in [place]   Referring Provider  Dr. Clent Ridges ]      Encounter Date: 02/07/2016  Check In:     Session Check In - 02/07/16 0930    Check-In   Location [facility]   Staff Present Diane Angelina Pih, MS, EP, [facility], Exercise Physiologist;Ragnar Waas Luther Parody, BS, EP, Exercise Physiologist;Christy Oletta Lamas, RN, BSN   Supervising physician immediately available to respond to emergencies See telemetry face sheet for immediately available MD   Medication changes reported     No   Fall or balance concerns reported    No   Warm-up and Cool-down Performed as group-led instruction   Resistance Training Performed Yes   VAD Patient? No   Pain Assessment   Currently in Pain? No/denies   Pain Score 0-No pain   Multiple Pain Sites No      Capillary Blood Glucose: No results found for this or any previous visit (from the past 24 hour(s)).   Goals Met:  Independence with exercise equipment Exercise tolerated well No report of cardiac concerns or symptoms Strength training completed today  Goals Unmet:  Not Applicable  Comments: Check out 930   Dr. Kate Sable is Medical Director for [place] and Pulmonary Rehab.

## 2016-02-08 ENCOUNTER — Encounter: Payer: Self-pay | Admitting: Cardiovascular Disease

## 2016-02-08 ENCOUNTER — Ambulatory Visit (INDEPENDENT_AMBULATORY_CARE_PROVIDER_SITE_OTHER): Payer: Medicare Other | Admitting: Cardiovascular Disease

## 2016-02-08 VITALS — BP 108/60 | HR 100 | Ht 67.0 in | Wt 158.0 lb

## 2016-02-08 DIAGNOSIS — Z951 Presence of aortocoronary bypass graft: Secondary | ICD-10-CM | POA: Diagnosis not present

## 2016-02-08 DIAGNOSIS — I739 Peripheral vascular disease, unspecified: Secondary | ICD-10-CM | POA: Diagnosis not present

## 2016-02-08 DIAGNOSIS — I10 Essential (primary) hypertension: Secondary | ICD-10-CM

## 2016-02-08 DIAGNOSIS — E785 Hyperlipidemia, unspecified: Secondary | ICD-10-CM

## 2016-02-08 MED ORDER — METOPROLOL TARTRATE 50 MG PO TABS: 50.0000 mg | ORAL_TABLET | Freq: Two times a day (BID) | ORAL | Status: DC

## 2016-02-08 NOTE — Progress Notes (Signed)
02/08/2016 GLENN POLKINGHORN   April 25, 1946  [id number]  Primary Physician Pcp Not In System Primary Cardiologist: Lorretta Harp MD Renae Gloss  HPI:  Mr. Malizia is a delightful 70 year old thin appearing married Caucasian male father of 2 children who I took care of for years and last saw 4-5 years ago. Accompanied by his wife today. He has a history of CAD status post circumflex stenting by myself at 2003. He also has a history of peripheral vascular disease status posts grafting of his lower extremities by Dr. Amedeo Plenty in the 90s. I performed stenting of his right lower extremity multiple times. He stopped smoking remotely. Kilograms include treated hypertension, hyperlipidemia and type 2 diabetes. He was admitted in February with unstable angina and underwent cardiac catheterization by Dr. Martinique 10/11/15 revealing three-vessel disease. He ultimately underwent coronary artery bypass grafting on 10/18/15 by Dr. Roxan Hockey is a LIMA to his LAD, vein to diagonal branch, obtuse marginal branches 1 and 2 and the distal RCA. His postop course was unremarkable. His ABI on the right was 0.53 on the left 0.68.   Current Outpatient Prescriptions  Medication Sig Dispense Refill  . aspirin 81 MG tablet Take 81 mg by mouth daily.    . Cholecalciferol (VITAMIN D) 2000 UNITS tablet Take 2,000 Units by mouth daily.    . clopidogrel (PLAVIX) 75 MG tablet Take 75 mg by mouth daily with breakfast.    . ferrous sulfate 325 (65 FE) MG tablet Take 325-650 mg by mouth 2 (two) times daily with a meal. 650mg  in the morning and 325mg  in the evening    . gabapentin (NEURONTIN) 300 MG capsule Take 300 mg by mouth 3 (three) times daily.     [name] glipiZIDE (GLUCOTROL) 5 MG tablet Take 10 mg by mouth 2 (two) times daily before a meal.     . levofloxacin (LEVAQUIN) 500 MG tablet Take 1 tablet (500 mg total) by mouth daily. 5 tablet 0  . lisinopril (PRINIVIL,ZESTRIL) 5 MG tablet     . metFORMIN (GLUCOPHAGE) 1000 MG  tablet Take 1,000 mg by mouth 2 (two) times daily with a meal.    . metoprolol (LOPRESSOR) 50 MG tablet Take 1 tablet (50 mg total) by mouth 2 (two) times daily. 180 tablet 3  . oxyCODONE (OXY IR/ROXICODONE) 5 MG immediate release tablet Take 1-2 tablets (5-10 mg total) by mouth every 4 (four) hours as needed for severe pain. 30 tablet 0  . pantoprazole (PROTONIX) 40 MG tablet Take 40 mg by mouth daily.    . simvastatin (ZOCOR) 40 MG tablet Take 20 mg by mouth at bedtime.      No current facility-administered medications for this visit.    No Known Allergies  Social History   Social History  . Marital Status: Married    Spouse Name: N/A  . Number of Children: N/A  . Years of Education: N/A   Occupational History  . Not on file.   Social History Main Topics  . Smoking status: Former Smoker -- 2.00 packs/day for 30 years    Types: Cigarettes    Quit date: 08/14/2000  . Smokeless tobacco: Not on file  . Alcohol Use: No  . Drug Use: No  . Sexual Activity: Not on file   Other Topics Concern  . Not on file   Social History Narrative     Review of Systems: General: negative for chills, fever, night sweats or weight changes.  Cardiovascular: negative for chest  pain, dyspnea on exertion, edema, orthopnea, palpitations, paroxysmal nocturnal dyspnea or shortness of breath Dermatological: negative for rash Respiratory: negative for cough or wheezing Urologic: negative for hematuria Abdominal: negative for nausea, vomiting, diarrhea, bright red blood per rectum, melena, or hematemesis Neurologic: negative for visual changes, syncope, or dizziness All other systems reviewed and are otherwise negative except as noted above.    Blood pressure 108/60, pulse 100, height 5\' 7"  (1.702 m), weight 158 lb (71.668 kg).  General appearance: alert and no distress Neck: no adenopathy, no carotid bruit, no JVD, supple, symmetrical, trachea midline and thyroid not enlarged, symmetric, no  tenderness/mass/nodules Lungs: clear to auscultation bilaterally Heart: regular rate and rhythm, S1, S2 normal, no murmur, click, rub or gallop Extremities: extremities normal, atraumatic, no cyanosis or edema  EKG not performed today  ASSESSMENT AND PLAN:   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 History of CAD status post circumflex stenting by myself in 2003. He was admitted with unstable angina 10/09/15 and underwent cardiac catheterization by Dr. Martinique 10/11/15 revealing a patent stent with three-vessel disease. He ultimately underwent coronary artery bypass grafting X 5 by Dr. Roxan Hockey with a LIMA to the LAD, vein graft to the first diagonal branch, first and second marginal branches and to the distal right coronary artery. His postop course was unremarkable. He is participating in cardiac rehabilitation at [facility].  PVD (peripheral vascular disease) [facility]) History of peripheral arterial disease status post left to right femorofemoral bypass grafting by Dr. Amedeo Plenty in the 90s. I performed multiple interventions on his right lower extremity as has the [facility] recently. His claudication is improved. We will check lower extremity arterial Doppler studies.  Essential hypertension History of hypertension blood pressure measured at 108/60 with a pulse of 100. He is on amlodipine, lisinopril and metoprolol. I'm going to discontinue the amlodipine and double the metoprolol.  HLD (hyperlipidemia) History of hyperlipidemia on statin therapy with recent lipid profile performed 10/11/15 revealed a total cholesterol 110, LDL 56 and HDL of 27.      Lorretta Harp MD [place], [facility] 02/08/2016 1:57 PM

## 2016-02-08 NOTE — Assessment & Plan Note (Signed)
History of hypertension blood pressure measured at 108/60 with a pulse of 100. He is on amlodipine, lisinopril and metoprolol. I'm going to discontinue the amlodipine and double the metoprolol.

## 2016-02-08 NOTE — Assessment & Plan Note (Signed)
History of CAD status post circumflex stenting by myself in 2003. He was admitted with unstable angina 10/09/15 and underwent cardiac catheterization by Dr. Martinique 10/11/15 revealing a patent stent with three-vessel disease. He ultimately underwent coronary artery bypass grafting X 5 by Dr. Roxan Hockey with a LIMA to the LAD, vein graft to the first diagonal branch, first and second marginal branches and to the distal right coronary artery. His postop course was unremarkable. He is participating in cardiac rehabilitation at [facility].

## 2016-02-08 NOTE — Assessment & Plan Note (Signed)
History of hyperlipidemia on statin therapy with recent lipid profile performed 10/11/15 revealed a total cholesterol 110, LDL 56 and HDL of 27.

## 2016-02-08 NOTE — Patient Instructions (Signed)
Medication Instructions:  Your physician has recommended you make the following change in your medication:  1- STOP TAKING YOUR Amlodipine 2- INCREASE Metoprolol to 50 mg (1 tablet) by mouth twice a day. You may take 2 tablets of your 25mg  tablets by mouth twice a day until you pick up your new prescription.  Labwork: none  Testing/Procedures: Your physician has requested that you have a lower extremity arterial doppler- During this test, ultrasound is used to evaluate arterial blood flow in the legs. Allow approximately one hour for this exam.    Follow-Up: We request that you follow-up in: [place] and in 6 MONTHS with Dr Andria Rhein will receive a reminder letter in the mail two months in advance. If you don't receive a letter, please call our office to schedule the follow-up appointment.    Any Other Special Instructions Will Be Listed Below (If Applicable).     If you need a refill on your cardiac medications before your next appointment, please call your pharmacy.

## 2016-02-08 NOTE — Assessment & Plan Note (Signed)
History of peripheral arterial disease status post left to right femorofemoral bypass grafting by Dr. Amedeo Plenty in the 90s. I performed multiple interventions on his right lower extremity as has the [facility] recently. His claudication is improved. We will check lower extremity arterial Doppler studies.

## 2016-02-09 ENCOUNTER — Encounter (HOSPITAL_COMMUNITY)
Admission: RE | Admit: 2016-02-09 | Discharge: 2016-02-09 | Disposition: A | Discharge status: Home / Self Care | Payer: Medicare Other | Source: Ambulatory Visit | Attending: Cardiovascular Disease | Admitting: Cardiovascular Disease

## 2016-02-09 DIAGNOSIS — Z951 Presence of aortocoronary bypass graft: Secondary | ICD-10-CM | POA: Diagnosis not present

## 2016-02-09 NOTE — Progress Notes (Signed)
Daily Session Note  Patient Details  Name: RAHMON HEIGL MRN: [id number] Date of Birth: 1946-06-20 Referring Provider:        CARDIAC REHAB PHASE II ORIENTATION from 01/18/2016 in [place]   Referring Provider  Dr. Clent Ridges ]      Encounter Date: 02/09/2016  Check In:     Session Check In - 02/09/16 0930    Check-In   Location [facility]   Staff Present Russella Dar, MS, EP, [facility], Exercise Physiologist;Tahlia Deamer Luther Parody, BS, EP, Exercise Physiologist   Supervising physician immediately available to respond to emergencies See telemetry face sheet for immediately available MD   Medication changes reported     No   Fall or balance concerns reported    No   Warm-up and Cool-down Performed as group-led instruction   Resistance Training Performed Yes   VAD Patient? No   Pain Assessment   Currently in Pain? No/denies   Pain Score 0-No pain   Multiple Pain Sites No      Capillary Blood Glucose: No results found for this or any previous visit (from the past 24 hour(s)).   Goals Met:  Independence with exercise equipment Exercise tolerated well No report of cardiac concerns or symptoms Strength training completed today  Goals Unmet:  Not Applicable  Comments: Check out 1030   Dr. Kate Sable is Medical Director for [place] and Pulmonary Rehab.

## 2016-02-11 ENCOUNTER — Encounter (HOSPITAL_COMMUNITY)
Admission: RE | Admit: 2016-02-11 | Discharge: 2016-02-11 | Disposition: A | Discharge status: Home / Self Care | Payer: Medicare Other | Source: Ambulatory Visit | Attending: Cardiovascular Disease | Admitting: Cardiovascular Disease

## 2016-02-11 DIAGNOSIS — Z951 Presence of aortocoronary bypass graft: Secondary | ICD-10-CM | POA: Diagnosis not present

## 2016-02-11 NOTE — Progress Notes (Signed)
Daily Session Note  Patient Details  Name: [name] MRN: [id number] Date of Birth: 03/16/1946 Referring Provider:        CARDIAC REHAB PHASE II ORIENTATION from 01/18/2016 in [place] CARDIAC REHABILITATION   Referring Provider  Dr. Berry [Dr. Berry ]      Encounter Date: 02/11/2016  Check In:     Session Check In - 02/11/16 0930    Check-In   Location [facility]   Staff Present Diane Coad, MS, EP, [facility], Exercise Physiologist;Kylian Loh, BS, EP, Exercise Physiologist;Debra Johnson, RN, BSN   Supervising physician immediately available to respond to emergencies See telemetry face sheet for immediately available MD   Medication changes reported     No   Fall or balance concerns reported    No   Warm-up and Cool-down Performed as group-led instruction   Resistance Training Performed Yes   VAD Patient? No   Pain Assessment   Currently in Pain? No/denies   Pain Score 0-No pain   Multiple Pain Sites No      Capillary Blood Glucose: No results found for this or any previous visit (from the past 24 hour(s)).   Goals Met:  Independence with exercise equipment Exercise tolerated well No report of cardiac concerns or symptoms Strength training completed today  Goals Unmet:  Not Applicable  Comments: Check out 1030   Dr. Suresh Koneswaran is Medical Director for [place] Cardiac and Pulmonary Rehab. 

## 2016-02-14 ENCOUNTER — Other Ambulatory Visit: Payer: Self-pay | Admitting: Cardiovascular Disease

## 2016-02-14 ENCOUNTER — Encounter (HOSPITAL_COMMUNITY)
Admission: RE | Admit: 2016-02-14 | Discharge: 2016-02-14 | Disposition: A | Discharge status: Home / Self Care | Payer: Medicare Other | Source: Ambulatory Visit | Attending: Cardiovascular Disease | Admitting: Cardiovascular Disease

## 2016-02-14 DIAGNOSIS — Z951 Presence of aortocoronary bypass graft: Secondary | ICD-10-CM | POA: Diagnosis not present

## 2016-02-14 DIAGNOSIS — I739 Peripheral vascular disease, unspecified: Secondary | ICD-10-CM

## 2016-02-14 NOTE — Progress Notes (Signed)
Daily Session Note  Patient Details  Name: [name] MRN: [id number] Date of Birth: [birth year] Referring Provider:        CARDIAC REHAB PHASE II ORIENTATION from 01/18/2016 in [place]   Referring Provider  Dr. Clent Ridges ]      Encounter Date: 02/14/2016  Check In:     Session Check In - 02/14/16 0930    Check-In   Location [facility]   Staff Present Diane Angelina Pih, MS, EP, [facility], Exercise Physiologist;Gregory Luther Parody, BS, EP, Exercise Physiologist;Niesha Bame Wynetta Emery, RN, BSN   Supervising physician immediately available to respond to emergencies See telemetry face sheet for immediately available MD   Medication changes reported     No   Fall or balance concerns reported    No   Warm-up and Cool-down Performed as group-led instruction   Resistance Training Performed Yes   VAD Patient? No   Pain Assessment   Currently in Pain? No/denies   Pain Score 0-No pain   Multiple Pain Sites No      Capillary Blood Glucose: No results found for this or any previous visit (from the past 24 hour(s)).   Goals Met:  Independence with exercise equipment Exercise tolerated well No report of cardiac concerns or symptoms Strength training completed today  Goals Unmet:  Not Applicable  Comments: Check out 10:30.   Dr. Kate Sable is Medical Director for [facility] Cardiac and Pulmonary Rehab.

## 2016-02-16 ENCOUNTER — Encounter (HOSPITAL_COMMUNITY)
Admission: RE | Admit: 2016-02-16 | Discharge: 2016-02-16 | Disposition: A | Discharge status: Home / Self Care | Payer: Medicare Other | Source: Ambulatory Visit | Attending: Cardiovascular Disease | Admitting: Cardiovascular Disease

## 2016-02-16 DIAGNOSIS — Z951 Presence of aortocoronary bypass graft: Secondary | ICD-10-CM | POA: Diagnosis not present

## 2016-02-16 NOTE — Progress Notes (Signed)
Daily Session Note  Patient Details  Name: [name] MRN: [id number] Date of Birth: November 28, 1945 Referring Provider:        CARDIAC REHAB PHASE II ORIENTATION from 01/18/2016 in [place]   Referring Provider  Dr. Clent Ridges ]      Encounter Date: 02/16/2016  Check In:     Session Check In - 02/16/16 0930    Check-In   Location [facility]   Staff Present Diane Angelina Pih, MS, EP, [facility], Exercise Physiologist;Christy Oletta Lamas, RN, Jennye Moccasin, RN, BSN   Supervising physician immediately available to respond to emergencies See telemetry face sheet for immediately available MD   Medication changes reported     No   Fall or balance concerns reported    No   Warm-up and Cool-down Performed as group-led instruction   Resistance Training Performed Yes   VAD Patient? No   Pain Assessment   Currently in Pain? No/denies   Pain Score 0-No pain   Multiple Pain Sites No      Capillary Blood Glucose: No results found for this or any previous visit (from the past 24 hour(s)).   Goals Met:  Independence with exercise equipment Exercise tolerated well No report of cardiac concerns or symptoms Strength training completed today  Goals Unmet:  Not Applicable  Comments: Check 10:30.    Dr. Kate Sable is Medical Director for [facility] Cardiac and Pulmonary Rehab.

## 2016-02-18 ENCOUNTER — Encounter (HOSPITAL_COMMUNITY)
Admission: RE | Admit: 2016-02-18 | Discharge: 2016-02-18 | Disposition: A | Discharge status: Home / Self Care | Payer: Medicare Other | Source: Ambulatory Visit | Attending: Cardiovascular Disease | Admitting: Cardiovascular Disease

## 2016-02-18 DIAGNOSIS — Z951 Presence of aortocoronary bypass graft: Secondary | ICD-10-CM | POA: Diagnosis not present

## 2016-02-18 NOTE — Progress Notes (Signed)
Daily Session Note  Patient Details  Name: [name] MRN: [id number] Date of Birth: 1946/06/07 Referring Provider:        CARDIAC REHAB PHASE II ORIENTATION from 01/18/2016 in [place]   Referring Provider  Dr. Clent Ridges ]      Encounter Date: 02/18/2016  Check In:     Session Check In - 02/18/16 0930    Check-In   Location [facility]   Staff Present Diane Angelina Pih, MS, EP, [facility], Exercise Physiologist;Rajan Burgard Luther Parody, BS, EP, Exercise Physiologist;Debra Wynetta Emery, RN, BSN   Supervising physician immediately available to respond to emergencies See telemetry face sheet for immediately available MD   Medication changes reported     No   Fall or balance concerns reported    No   Warm-up and Cool-down Performed as group-led instruction   Resistance Training Performed Yes   VAD Patient? No   Pain Assessment   Currently in Pain? No/denies   Pain Score 0-No pain   Multiple Pain Sites No      Capillary Blood Glucose: No results found for this or any previous visit (from the past 24 hour(s)).   Goals Met:  Independence with exercise equipment Exercise tolerated well No report of cardiac concerns or symptoms Strength training completed today  Goals Unmet:  Not Applicable  Comments: Check out 1030   Dr. Kate Sable is Medical Director for [place] and Pulmonary Rehab.

## 2016-02-21 ENCOUNTER — Encounter (HOSPITAL_COMMUNITY)
Admission: RE | Admit: 2016-02-21 | Discharge: 2016-02-21 | Disposition: A | Discharge status: Home / Self Care | Payer: Medicare Other | Source: Ambulatory Visit | Attending: Cardiovascular Disease | Admitting: Cardiovascular Disease

## 2016-02-21 DIAGNOSIS — Z951 Presence of aortocoronary bypass graft: Secondary | ICD-10-CM | POA: Diagnosis not present

## 2016-02-21 NOTE — Progress Notes (Signed)
Daily Session Note  Patient Details  Name: DRAYCE TAWIL MRN: [id number] Date of Birth: 14-Aug-1946 Referring Provider:        CARDIAC REHAB PHASE II ORIENTATION from 01/18/2016 in [place]   Referring Provider  Dr. Clent Ridges ]      Encounter Date: 02/21/2016  Check In:     Session Check In - 02/21/16 0930    Check-In   Location [facility]   Staff Present Diane Angelina Pih, MS, EP, [facility], Exercise Physiologist;[place] Luther Parody, BS, EP, Exercise Physiologist;Debra Wynetta Emery, RN, BSN   Supervising physician immediately available to respond to emergencies See telemetry face sheet for immediately available MD   Medication changes reported     No   Fall or balance concerns reported    No   Warm-up and Cool-down Performed as group-led instruction   Resistance Training Performed Yes   VAD Patient? No   Pain Assessment   Currently in Pain? No/denies   Pain Score 0-No pain   Multiple Pain Sites No      Capillary Blood Glucose: No results found for this or any previous visit (from the past 24 hour(s)).   Goals Met:  Independence with exercise equipment Exercise tolerated well No report of cardiac concerns or symptoms Strength training completed today  Goals Unmet:  Not Applicable  Comments: Check out 1030   Dr. Kate Sable is Medical Director for [place] and Pulmonary Rehab.

## 2016-02-22 ENCOUNTER — Ambulatory Visit (HOSPITAL_COMMUNITY)
Admission: RE | Admit: 2016-02-22 | Discharge: 2016-02-22 | Disposition: A | Discharge status: Home / Self Care | Payer: Medicare Other | Source: Ambulatory Visit | Attending: Cardiovascular Disease | Admitting: Cardiovascular Disease

## 2016-02-22 DIAGNOSIS — I2510 Atherosclerotic heart disease of native coronary artery without angina pectoris: Secondary | ICD-10-CM | POA: Diagnosis not present

## 2016-02-22 DIAGNOSIS — K219 Gastro-esophageal reflux disease without esophagitis: Secondary | ICD-10-CM | POA: Diagnosis not present

## 2016-02-22 DIAGNOSIS — Z951 Presence of aortocoronary bypass graft: Secondary | ICD-10-CM | POA: Diagnosis not present

## 2016-02-22 DIAGNOSIS — E1142 Type 2 diabetes mellitus with diabetic polyneuropathy: Secondary | ICD-10-CM | POA: Diagnosis not present

## 2016-02-22 DIAGNOSIS — I10 Essential (primary) hypertension: Secondary | ICD-10-CM | POA: Insufficient documentation

## 2016-02-22 DIAGNOSIS — I771 Stricture of artery: Secondary | ICD-10-CM | POA: Insufficient documentation

## 2016-02-22 DIAGNOSIS — I739 Peripheral vascular disease, unspecified: Secondary | ICD-10-CM | POA: Insufficient documentation

## 2016-02-22 DIAGNOSIS — R938 Abnormal findings on diagnostic imaging of other specified body structures: Secondary | ICD-10-CM | POA: Diagnosis not present

## 2016-02-22 DIAGNOSIS — E7800 Pure hypercholesterolemia, unspecified: Secondary | ICD-10-CM | POA: Insufficient documentation

## 2016-02-23 ENCOUNTER — Encounter (HOSPITAL_COMMUNITY)
Admission: RE | Admit: 2016-02-23 | Discharge: 2016-02-23 | Disposition: A | Discharge status: Home / Self Care | Payer: Medicare Other | Source: Ambulatory Visit | Attending: Cardiovascular Disease | Admitting: Cardiovascular Disease

## 2016-02-23 DIAGNOSIS — Z951 Presence of aortocoronary bypass graft: Secondary | ICD-10-CM | POA: Diagnosis not present

## 2016-02-23 NOTE — Progress Notes (Signed)
Daily Session Note  Patient Details  Name: REHAAN VILORIA MRN: [id number] Date of Birth: Jul 10, 1946 Referring Provider:        CARDIAC REHAB PHASE II ORIENTATION from 01/18/2016 in [place]   Referring Provider  Dr. Clent Ridges ]      Encounter Date: 02/23/2016  Check In:     Session Check In - 02/23/16 0930    Check-In   Location [facility]   Staff Present Suzanne Boron, BS, EP, Exercise Physiologist;Decklyn Hornik Wynetta Emery, RN, BSN   Supervising physician immediately available to respond to emergencies See telemetry face sheet for immediately available MD   Medication changes reported     No   Fall or balance concerns reported    No   Warm-up and Cool-down Performed as group-led instruction   Resistance Training Performed Yes   VAD Patient? No   Pain Assessment   Currently in Pain? No/denies   Pain Score 0-No pain   Multiple Pain Sites No      Capillary Blood Glucose: No results found for this or any previous visit (from the past 24 hour(s)).   Goals Met:  Independence with exercise equipment Exercise tolerated well No report of cardiac concerns or symptoms Strength training completed today  Goals Unmet:  Not Applicable  Comments: Check out 1030   Dr. Kate Sable is Medical Director for [place] and Pulmonary Rehab.

## 2016-02-24 DIAGNOSIS — I10 Essential (primary) hypertension: Secondary | ICD-10-CM | POA: Diagnosis not present

## 2016-02-24 DIAGNOSIS — Z955 Presence of coronary angioplasty implant and graft: Secondary | ICD-10-CM | POA: Diagnosis not present

## 2016-02-24 DIAGNOSIS — R531 Weakness: Secondary | ICD-10-CM | POA: Diagnosis not present

## 2016-02-24 DIAGNOSIS — Z7984 Long term (current) use of oral hypoglycemic drugs: Secondary | ICD-10-CM | POA: Diagnosis not present

## 2016-02-24 DIAGNOSIS — Z79899 Other long term (current) drug therapy: Secondary | ICD-10-CM | POA: Diagnosis not present

## 2016-02-24 DIAGNOSIS — E119 Type 2 diabetes mellitus without complications: Secondary | ICD-10-CM | POA: Diagnosis not present

## 2016-02-24 DIAGNOSIS — Z7982 Long term (current) use of aspirin: Secondary | ICD-10-CM | POA: Diagnosis not present

## 2016-02-24 DIAGNOSIS — R55 Syncope and collapse: Secondary | ICD-10-CM | POA: Diagnosis not present

## 2016-02-24 DIAGNOSIS — R42 Dizziness and giddiness: Secondary | ICD-10-CM | POA: Diagnosis not present

## 2016-02-25 ENCOUNTER — Encounter (HOSPITAL_COMMUNITY)
Admission: RE | Admit: 2016-02-25 | Discharge: 2016-02-25 | Disposition: A | Discharge status: Home / Self Care | Payer: Medicare Other | Source: Ambulatory Visit | Attending: Cardiovascular Disease | Admitting: Cardiovascular Disease

## 2016-02-25 DIAGNOSIS — Z951 Presence of aortocoronary bypass graft: Secondary | ICD-10-CM

## 2016-02-25 NOTE — Progress Notes (Signed)
Daily Session Note  Patient Details  Name: KAVAN DEVAN MRN: [id number] Date of Birth: 04/22/1946 Referring Provider:        CARDIAC REHAB PHASE II ORIENTATION from 01/18/2016 in [place]   Referring Provider  Dr. Clent Ridges ]      Encounter Date: 02/25/2016  Check In:     Session Check In - 02/25/16 0930    Check-In   Location [facility]   Staff Present Diane Angelina Pih, MS, EP, [facility], Exercise Physiologist;[place] Luther Parody, BS, EP, Exercise Physiologist;Debra Wynetta Emery, RN, BSN   Supervising physician immediately available to respond to emergencies See telemetry face sheet for immediately available MD   Medication changes reported     No   Fall or balance concerns reported    No   Warm-up and Cool-down Performed as group-led instruction   Resistance Training Performed Yes   VAD Patient? No   Pain Assessment   Currently in Pain? No/denies   Pain Score 0-No pain   Multiple Pain Sites No      Capillary Blood Glucose: No results found for this or any previous visit (from the past 24 hour(s)).   Goals Met:  Independence with exercise equipment Exercise tolerated well No report of cardiac concerns or symptoms Strength training completed today  Goals Unmet:  Not Applicable  Comments: Check out 1030   Dr. Kate Sable is Medical Director for [place] and Pulmonary Rehab.

## 2016-02-28 ENCOUNTER — Encounter (HOSPITAL_COMMUNITY): Payer: Medicare Other

## 2016-03-01 ENCOUNTER — Encounter (HOSPITAL_COMMUNITY): Payer: Medicare Other

## 2016-03-02 ENCOUNTER — Telehealth: Payer: Self-pay | Admitting: *Deleted

## 2016-03-02 DIAGNOSIS — I10 Essential (primary) hypertension: Secondary | ICD-10-CM

## 2016-03-02 DIAGNOSIS — I739 Peripheral vascular disease, unspecified: Secondary | ICD-10-CM

## 2016-03-02 NOTE — Telephone Encounter (Signed)
Notes Recorded by Vanessa Ralphs, RN on 03/02/2016 at 9:31 AM Results called to pt. Pt verbalized understanding. Appt scheduled for 04/04/16 with Dr Gwenlyn Found. Order entered and pt aware he will need to have dopplers scheduled before appt. ------

## 2016-03-02 NOTE — Telephone Encounter (Signed)
-----   Message from Lorretta Harp, MD sent at 02/27/2016  3:34 PM EDT ----- Needs Aorto iliac dopplers then ROV with me to discuss

## 2016-03-03 ENCOUNTER — Encounter (HOSPITAL_COMMUNITY)
Admission: RE | Admit: 2016-03-03 | Discharge: 2016-03-03 | Disposition: A | Discharge status: Home / Self Care | Payer: Medicare Other | Source: Ambulatory Visit | Attending: Cardiovascular Disease | Admitting: Cardiovascular Disease

## 2016-03-03 ENCOUNTER — Telehealth: Payer: Self-pay | Admitting: Cardiovascular Disease

## 2016-03-03 DIAGNOSIS — Z951 Presence of aortocoronary bypass graft: Secondary | ICD-10-CM | POA: Diagnosis not present

## 2016-03-03 NOTE — Progress Notes (Signed)
Daily Session Note  Patient Details  Name: BIAGIO SNELSON MRN: [id number] Date of Birth: 1946/06/11 Referring Provider:        CARDIAC REHAB PHASE II ORIENTATION from 01/18/2016 in [place]   Referring Provider  Dr. Clent Ridges ]      Encounter Date: 03/03/2016  Check In:     Session Check In - 03/03/16 0930    Check-In   Location [facility]   Staff Present Diane Angelina Pih, MS, EP, [facility], Exercise Physiologist;Gregory Luther Parody, BS, EP, Exercise Physiologist;Henery Betzold Wynetta Emery, RN, BSN   Supervising physician immediately available to respond to emergencies See telemetry face sheet for immediately available MD   Medication changes reported     No   Fall or balance concerns reported    No   Warm-up and Cool-down Performed as group-led instruction   Resistance Training Performed Yes   VAD Patient? No   Pain Assessment   Currently in Pain? No/denies   Pain Score 0-No pain   Multiple Pain Sites No      Capillary Blood Glucose: No results found for this or any previous visit (from the past 24 hour(s)).   Goals Met:  Independence with exercise equipment Exercise tolerated well No report of cardiac concerns or symptoms Strength training completed today  Goals Unmet:  Not Applicable  Comments: Check out 1030.   Dr. Kate Sable is Medical Director for [facility] Cardiac and Pulmonary Rehab.

## 2016-03-03 NOTE — Telephone Encounter (Signed)
F/u  Pt returning RN phone call- test results. Please call back and discuss.  a

## 2016-03-03 NOTE — Telephone Encounter (Signed)
Unable to reach patient at this time, unable to leave message. According to patient's chart, the nurse has already spoken to the patient about his lab results. Patient should be receiving a call to schedule his aorta iliac dopplers. Will send message to scheduler.

## 2016-03-06 ENCOUNTER — Encounter (HOSPITAL_COMMUNITY)
Admission: RE | Admit: 2016-03-06 | Discharge: 2016-03-06 | Disposition: A | Discharge status: Home / Self Care | Payer: Medicare Other | Source: Ambulatory Visit | Attending: Cardiovascular Disease | Admitting: Cardiovascular Disease

## 2016-03-06 DIAGNOSIS — Z951 Presence of aortocoronary bypass graft: Secondary | ICD-10-CM | POA: Diagnosis not present

## 2016-03-06 NOTE — Progress Notes (Signed)
Daily Session Note  Patient Details  Name: [name] MRN: [id number] Date of Birth: March 29, 1946 Referring Provider:   Flowsheet Row CARDIAC REHAB PHASE II ORIENTATION from 01/18/2016 in [place]  Referring Provider  Dr. Clent Ridges ]      Encounter Date: 03/06/2016  Check In:     Session Check In - 03/06/16 0930      Check-In   Location [facility]   Staff Present Diane Angelina Pih, MS, EP, [facility], Exercise Physiologist;Zenna Traister Luther Parody, BS, EP, Exercise Physiologist;Debra Wynetta Emery, RN, BSN   Supervising physician immediately available to respond to emergencies See telemetry face sheet for immediately available MD   Medication changes reported     No   Fall or balance concerns reported    No   Warm-up and Cool-down Performed as group-led instruction   Resistance Training Performed Yes   VAD Patient? No     Pain Assessment   Currently in Pain? No/denies   Pain Score 0-No pain   Multiple Pain Sites No      Capillary Blood Glucose: No results found for this or any previous visit (from the past 24 hour(s)).   Goals Met:  Independence with exercise equipment Exercise tolerated well No report of cardiac concerns or symptoms Strength training completed today  Goals Unmet:  Not Applicable  Comments: Check out 1030   Dr. Kate Sable is Medical Director for [place] and Pulmonary Rehab.

## 2016-03-08 ENCOUNTER — Encounter (HOSPITAL_COMMUNITY)
Admission: RE | Admit: 2016-03-08 | Discharge: 2016-03-08 | Disposition: A | Discharge status: Home / Self Care | Payer: Medicare Other | Source: Ambulatory Visit | Attending: Cardiovascular Disease | Admitting: Cardiovascular Disease

## 2016-03-08 ENCOUNTER — Other Ambulatory Visit: Payer: Self-pay | Admitting: Cardiovascular Disease

## 2016-03-08 DIAGNOSIS — Z951 Presence of aortocoronary bypass graft: Secondary | ICD-10-CM

## 2016-03-08 DIAGNOSIS — I739 Peripheral vascular disease, unspecified: Secondary | ICD-10-CM

## 2016-03-08 NOTE — Progress Notes (Signed)
Daily Session Note  Patient Details  Name: PROSPER PAFF MRN: [id number] Date of Birth: October 11, 1945 Referring Provider:   Flowsheet Row CARDIAC REHAB PHASE II ORIENTATION from 01/18/2016 in [place]  Referring Provider  Dr. Clent Ridges ]      Encounter Date: 03/08/2016  Check In:     Session Check In - 03/08/16 0930      Check-In   Location [facility]   Staff Present Diane Angelina Pih, MS, EP, [facility], Exercise Physiologist;Gregory Luther Parody, BS, EP, Exercise Physiologist;Andreika Vandagriff Wynetta Emery, RN, BSN   Supervising physician immediately available to respond to emergencies See telemetry face sheet for immediately available MD   Medication changes reported     No   Fall or balance concerns reported    No   Warm-up and Cool-down Performed as group-led instruction   Resistance Training Performed Yes   VAD Patient? No     Pain Assessment   Currently in Pain? No/denies   Pain Score 0-No pain   Multiple Pain Sites No      Capillary Blood Glucose: No results found for this or any previous visit (from the past 24 hour(s)).   Goals Met:  Independence with exercise equipment Exercise tolerated well No report of cardiac concerns or symptoms Strength training completed today  Goals Unmet:  Not Applicable  Comments: Check out 1030.   Dr. Kate Sable is Medical Director for [facility] Cardiac and Pulmonary Rehab.

## 2016-03-09 NOTE — Telephone Encounter (Signed)
Dopplers scheduled for 03/16/16.

## 2016-03-09 NOTE — Progress Notes (Signed)
Cardiac Individual Treatment Plan  Patient Details  Name: [name] MRN: [id number] Date of Birth: 07/23/1946 Referring Provider:   Flowsheet Row CARDIAC REHAB PHASE II ORIENTATION from 01/18/2016 in [place]  Referring Provider  Dr. Gwenlyn Found [Dr. Gwenlyn Found ]      Initial Encounter Date:  Flowsheet Row CARDIAC REHAB PHASE II ORIENTATION from 01/18/2016 in [place]  Date  01/18/16  Referring Provider  Dr. Gwenlyn Found [Dr. Gwenlyn Found ]      Visit Diagnosis: Status post coronary artery bypass grafts x 5  Patient's Home Medications on Admission:  Current Outpatient Prescriptions:  .  aspirin 81 MG tablet, Take 81 mg by mouth daily., Disp: , Rfl:  .  Cholecalciferol (VITAMIN D) 2000 UNITS tablet, Take 2,000 Units by mouth daily., Disp: , Rfl:  .  clopidogrel (PLAVIX) 75 MG tablet, Take 75 mg by mouth daily with breakfast., Disp: , Rfl:  .  ferrous sulfate 325 (65 FE) MG tablet, Take 325-650 mg by mouth 2 (two) times daily with a meal. 650mg  in the morning and 325mg  in the evening, Disp: , Rfl:  .  gabapentin (NEURONTIN) 300 MG capsule, Take 300 mg by mouth 3 (three) times daily. , Disp: , Rfl:  .  glipiZIDE (GLUCOTROL) 5 MG tablet, Take 10 mg by mouth 2 (two) times daily before a meal. , Disp: , Rfl:  .  levofloxacin (LEVAQUIN) 500 MG tablet, Take 1 tablet (500 mg total) by mouth daily., Disp: 5 tablet, Rfl: 0 .  lisinopril (PRINIVIL,ZESTRIL) 5 MG tablet, , Disp: , Rfl:  .  metFORMIN (GLUCOPHAGE) 1000 MG tablet, Take 1,000 mg by mouth 2 (two) times daily with a meal., Disp: , Rfl:  .  metoprolol (LOPRESSOR) 50 MG tablet, Take 1 tablet (50 mg total) by mouth 2 (two) times daily., Disp: 180 tablet, Rfl: 3 .  oxyCODONE (OXY IR/ROXICODONE) 5 MG immediate release tablet, Take 1-2 tablets (5-10 mg total) by mouth every 4 (four) hours as needed for severe pain., Disp: 30 tablet, Rfl: 0 .  pantoprazole (PROTONIX) 40 MG tablet, Take 40 mg by mouth daily., Disp: ,  Rfl:  .  simvastatin (ZOCOR) 40 MG tablet, Take 20 mg by mouth at bedtime. , Disp: , Rfl:   Past Medical History: Past Medical History:  Diagnosis Date  . CAD S/P percutaneous coronary angioplasty 1998   PCI TO CX  . Diabetes mellitus without complication ([place])   . GERD (gastroesophageal reflux disease)   . 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 Hypercholesteremia   . Hypertension   . Neuropathy ([place])   . Peripheral vascular disease ([place])    s/p R-L FEM-FEM BYPASS    Tobacco Use: History  Smoking Status  . Former Smoker  . Packs/day: 2.00  . Years: 30.00  . Types: Cigarettes  . Quit date: 08/14/2000  Smokeless Tobacco  . Not on file    Labs: Recent Review Flowsheet Data    Labs for ITP Cardiac and Pulmonary Rehab Latest Ref Rng & Units 10/18/2015 10/18/2015 10/18/2015 10/19/2015 01/13/2016   Cholestrol 0 - 200 mg/dL - - - - -   LDLCALC 0 - 99 mg/dL - - - - -   HDL >40 mg/dL - - - - -   Trlycerides <150 mg/dL - - - - -   Hemoglobin A1c 4.8 - 5.6 % - - - - 8.4(H)   PHART 7.350 - 7.450 7.433 7.364 - - -   PCO2ART  35.0 - 45.0 mmHg 36.9 39.6 - - -   HCO3 20.0 - 24.0 mEq/L 24.7(H) 22.6 - - -   TCO2 0 - 100 mmol/L 26 24 24 24  -   ACIDBASEDEF 0.0 - 2.0 mmol/L - 3.0(H) - - -   O2SAT % 99.0 96.0 - - -      Capillary Blood Glucose: Lab Results  Component Value Date   GLUCAP 126 (H) 01/14/2016   GLUCAP 157 (H) 01/13/2016   GLUCAP 105 (H) 01/13/2016   GLUCAP 219 (H) 01/13/2016   GLUCAP 138 (H) 01/13/2016     Exercise Target Goals:    Exercise Program Goal: Individual exercise prescription set with THRR, safety & activity barriers. Participant demonstrates ability to understand and report RPE using BORG scale, to self-measure pulse accurately, and to acknowledge the importance of the exercise prescription.  Exercise Prescription Goal: Starting with aerobic activity 30 plus minutes a day, 3 days per week for initial exercise prescription. Provide home exercise prescription and  guidelines that participant acknowledges understanding prior to discharge.  Activity Barriers & Risk Stratification:     Activity Barriers & Cardiac Risk Stratification - 01/18/16 1739      Activity Barriers & Cardiac Risk Stratification   Activity Barriers Balance Concerns  Neuropathy resulting in (R) leg pain and weakness   Cardiac Risk Stratification High      6 Minute Walk:     6 Minute Walk    Row Name 01/19/16 1336         6 Minute Walk   Phase Initial     Distance 900 feet     Walk Time 6 minutes     # of Rest Breaks 0     MPH 1.7     METS 2.3     RPE 15     Perceived Dyspnea  9     VO2 Peak 9.01     Symptoms Yes (comment)     Comments Patient c/o pain at end of the test his legs, more in (R) leg, 8/10.  After a 2 minute rest period, pain was 2/10. Pain was gone before patient left at the end of orientation.      Resting HR 102 bpm     Resting BP 92/48     Max Ex. HR 91 bpm     Max Ex. BP 150/40     2 Minute Post BP 92/40        Initial Exercise Prescription:     Initial Exercise Prescription - 01/19/16 1300      Date of Initial Exercise RX and Referring Provider   Date 01/18/16   Referring Provider Dr. Gwenlyn Found  Dr. Gwenlyn Found      Treadmill   MPH 1.3   Grade 0   Minutes 15   METs 1.9     NuStep   Level 2   Watts 18   Minutes 15   METs 1.9     Arm Ergometer   Level 2   Watts 18   Minutes 15   METs 2.3     Prescription Details   Frequency (times per week) 3   Duration Progress to 45 minutes of aerobic exercise without signs/symptoms of physical distress     Intensity   THRR REST +  30   THRR 40-80% of Max Heartrate [phone]   Ratings of Perceived Exertion 11-13   Perceived Dyspnea 0-4     Progression   Progression Continue to progress workloads to maintain  intensity without signs/symptoms of physical distress.     Resistance Training   Training Prescription Yes   Weight 1   Reps 10-12      Perform Capillary Blood Glucose  checks as needed.  Exercise Prescription Changes:      Exercise Prescription Changes    Row Name 02/07/16 1800 02/14/16 1400 03/03/16 1300         Exercise Review   Progression Yes Yes Yes       Response to Exercise   Blood Pressure (Admit) 110/50 116/50 104/62     Blood Pressure (Exercise) 142/40 116/50 110/55     Blood Pressure (Exit) 120/42 98/50 110/50     Heart Rate (Admit) 79 bpm 73 bpm 68 bpm     Heart Rate (Exercise) 109 bpm 95 bpm 90 bpm     Heart Rate (Exit) 81 bpm 74 bpm 76 bpm     Rating of Perceived Exertion (Exercise) 11 11 11      Comments Have not put him on TM yet. Strengthening his legs for a few more sessions and will then transition him to the TM. Have not put him on TM yet. Strengthening his legs for a few more sessions and will then transition him to the TM. Have not put him on TM yet. Strengthening his legs for a few more sessions and will then transition him to the TM.     Duration Progress to 30 minutes of continuous aerobic without signs/symptoms of physical distress Progress to 30 minutes of continuous aerobic without signs/symptoms of physical distress Progress to 30 minutes of continuous aerobic without signs/symptoms of physical distress     Intensity Rest + 30 Rest + 30 Rest + 30       Resistance Training   Training Prescription Yes Yes Yes     Weight 2 2 3      Reps 10-12 10-12 10-12       Treadmill   MPH 0 0 0     Grade 0 0 0     Minutes 0 0 0     METs 0 0 0       NuStep   Level 2 2 3      Watts 31 32 39     Minutes 15 20 20      METs 3.48 3.48 3.47       Arm Ergometer   Level 2 2 2.3     Watts 19 21 24      Minutes 20 15 15      METs 2.9 3.1 3.3       Home Exercise Plan   Plans to continue exercise at [facility]     Frequency Add 2 additional days to program exercise sessions. Add 2 additional days to program exercise sessions. Add 2 additional days to program exercise sessions.        Exercise Comments:      Exercise Comments     Row Name 03/08/16 0919           Exercise Comments Patient is proggressing appropriately            Discharge Exercise Prescription (Final Exercise Prescription Changes):     Exercise Prescription Changes - 03/03/16 1300      Exercise Review   Progression Yes     Response to Exercise   Blood Pressure (Admit) 104/62   Blood Pressure (Exercise) 110/55   Blood Pressure (Exit) 110/50   Heart Rate (Admit) 68 bpm   Heart Rate (  Exercise) 90 bpm   Heart Rate (Exit) 76 bpm   Rating of Perceived Exertion (Exercise) 11   Comments Have not put him on TM yet. Strengthening his legs for a few more sessions and will then transition him to the TM.   Duration Progress to 30 minutes of continuous aerobic without signs/symptoms of physical distress   Intensity Rest + 30     Resistance Training   Training Prescription Yes   Weight 3   Reps 10-12     Treadmill   MPH 0   Grade 0   Minutes 0   METs 0     NuStep   Level 3   Watts 39   Minutes 20   METs 3.47     Arm Ergometer   Level 2.3   Watts 24   Minutes 15   METs 3.3     Home Exercise Plan   Plans to continue exercise at Home   Frequency Add 2 additional days to program exercise sessions.      Nutrition:  Target Goals: Understanding of nutrition guidelines, daily intake of sodium 1500mg , cholesterol 200mg , calories 30% from fat and 7% or less from saturated fats, daily to have 5 or more servings of fruits and vegetables.  Biometrics:     Pre Biometrics - 01/19/16 1343      Pre Biometrics   Height 5\' 8"  (1.727 m)   Weight 158 lb 8.2 oz (71.9 kg)   Waist Circumference 41 inches   Hip Circumference 37.5 inches   Waist to Hip Ratio 1.09 %   BMI (Calculated) 24.2   Triceps Skinfold 11 mm   % Body Fat 25.5 %   Grip Strength 74 kg   Flexibility 16 in   Single Leg Stand 7 seconds       Nutrition Therapy Plan and Nutrition Goals:   Nutrition Discharge: Rate Your Plate Scores:     Nutrition Assessments  - 01/18/16 1742      MEDFICTS Scores   Pre Score 39      Nutrition Goals Re-Evaluation:   Psychosocial: Target Goals: Acknowledge presence or absence of depression, maximize coping skills, provide positive support system. Participant is able to verbalize types and ability to use techniques and skills needed for reducing stress and depression.  Initial Review & Psychosocial Screening:     Initial Psych Review & Screening - 01/18/16 1752      Initial Review   Current issues with --  None     Family Dynamics   Good Support System? Yes     Barriers   Psychosocial barriers to participate in program There are no identifiable barriers or psychosocial needs.     Screening Interventions   Interventions Encouraged to exercise      Quality of Life Scores:     Quality of Life - 01/19/16 1345      Quality of Life Scores   Health/Function Pre 30 %   Socioeconomic Pre 30 %   Psych/Spiritual Pre 30 %   Family Pre 30 %   GLOBAL Pre 30 %      PHQ-9: Recent Review Flowsheet Data    Depression screen [facility] 2/9 01/18/2016   Decreased Interest 0   Down, Depressed, Hopeless 0   PHQ - 2 Score 0   Altered sleeping 0   Tired, decreased energy 0   Change in appetite 2   Feeling bad or failure about yourself  0   Trouble concentrating 0   Moving  slowly or fidgety/restless 0   Suicidal thoughts 0   PHQ-9 Score 2   Difficult doing work/chores Not difficult at all      Psychosocial Evaluation and Intervention:   Psychosocial Re-Evaluation:     Psychosocial Re-Evaluation    [place] Name 02/07/16 1839 03/09/16 0805           Psychosocial Re-Evaluation   Interventions Encouraged to attend Cardiac Rehabilitation for the exercise  -      Comments  - Patient has no psychosocial issues. His QOL score was 30.0 and his PHQ-9 score was 2. He does not need counseling.       Continued Psychosocial Services Needed No No         Vocational Rehabilitation: Provide vocational rehab  assistance to qualifying candidates.   Vocational Rehab Evaluation & Intervention:     Vocational Rehab - 01/18/16 1741      Initial Vocational Rehab Evaluation & Intervention   Assessment shows need for Vocational Rehabilitation No      Education: Education Goals: Education classes will be provided on a weekly basis, covering required topics. Participant will state understanding/return demonstration of topics presented.  Learning Barriers/Preferences:     Learning Barriers/Preferences - 01/18/16 1740      Learning Barriers/Preferences   Learning Barriers Reading  Does not read well   Learning Preferences Video;Skilled Demonstration      Education Topics: Hypertension, Hypertension Reduction -Define heart disease and high blood pressure. Discus how high blood pressure affects the body and ways to reduce high blood pressure.   Exercise and Your Heart -Discuss why it is important to exercise, the FITT principles of exercise, normal and abnormal responses to exercise, and how to exercise safely.   Angina -Discuss definition of angina, causes of angina, treatment of angina, and how to decrease risk of having angina.   Cardiac Medications -Review what the following cardiac medications are used for, how they affect the body, and side effects that may occur when taking the medications.  Medications include Aspirin, Beta blockers, calcium channel blockers, ACE Inhibitors, angiotensin receptor blockers, diuretics, digoxin, and antihyperlipidemics.   Congestive Heart Failure -Discuss the definition of CHF, how to live with CHF, the signs and symptoms of CHF, and how keep track of weight and sodium intake.   Heart Disease and Intimacy -Discus the effect sexual activity has on the heart, how changes occur during intimacy as we age, and safety during sexual activity.   Smoking Cessation / COPD -Discuss different methods to quit smoking, the health benefits of quitting smoking,  and the definition of COPD. Flowsheet Row CARDIAC REHAB PHASE II EXERCISE from 02/23/2016 in [place]  Date  01/26/16  Educator  Russella Dar  Instruction Review Code  2- meets goals/outcomes      Nutrition I: Fats -Discuss the types of cholesterol, what cholesterol does to the heart, and how cholesterol levels can be controlled. Flowsheet Row CARDIAC REHAB PHASE II EXERCISE from 02/23/2016 in [place]  Date  02/02/16  Educator  Russella Dar  Instruction Review Code  2- meets goals/outcomes      Nutrition II: Labels -Discuss the different components of food labels and how to read food label [place] from 02/23/2016 in [place]  Date  02/09/16  Educator  Russella Dar  Instruction Review Code  2- meets goals/outcomes      Heart Parts and Heart Disease -Discuss the anatomy of the heart,  the pathway of blood circulation through the heart, and these are affected by heart disease. Flowsheet Row CARDIAC REHAB PHASE II EXERCISE from 02/23/2016 in [place]  Date  02/16/16  Educator  DC  Instruction Review Code  2- meets goals/outcomes      Stress I: Signs and Symptoms -Discuss the causes of stress, how stress may lead to anxiety and depression, and ways to limit stress. Flowsheet Row CARDIAC REHAB PHASE II EXERCISE from 02/23/2016 in [place]  Date  02/23/16  Educator  DJ  Instruction Review Code  2- meets goals/outcomes      Stress II: Relaxation -Discuss different types of relaxation techniques to limit stress.   Warning Signs of Stroke / TIA -Discuss definition of a stroke, what the signs and symptoms are of a stroke, and how to identify when someone is having stroke.   Knowledge Questionnaire Score:     Knowledge Questionnaire Score - 01/18/16 1741      Knowledge Questionnaire Score   Pre Score 17/24      Core  Components/Risk Factors/Patient Goals at Admission:     Personal Goals and Risk Factors at Admission - 01/18/16 1742      Core Components/Risk Factors/Patient Goals on Admission    Weight Management Yes   Intervention Weight Management/Obesity: Establish reasonable short term and long term weight goals.   Admit Weight 158 lb 9.6 oz (71.9 kg)   Goal Weight: Short Term 153 lb 9.6 oz (69.7 kg)   Goal Weight: Long Term 148 lb 9.6 oz (67.4 kg)   Expected Outcomes Short Term: Continue to assess and modify interventions until short term weight is achieved;Long Term: Adherence to nutrition and physical activity/exercise program aimed toward attainment of established weight goal   Sedentary Yes   Intervention Provide advice, education, support and counseling about physical activity/exercise needs.;Develop an individualized exercise prescription for aerobic and resistive training based on initial evaluation findings, risk stratification, comorbidities and participant's personal goals.   Expected Outcomes Achievement of increased cardiorespiratory fitness and enhanced flexibility, muscular endurance and strength shown through measurements of functional capacity and personal statement of participant.   Increase Strength and Stamina Yes   Intervention Provide advice, education, support and counseling about physical activity/exercise needs.;Develop an individualized exercise prescription for aerobic and resistive training based on initial evaluation findings, risk stratification, comorbidities and participant's personal goals.   Diabetes Yes   Expected Outcomes Short Term: Participant verbalizes understanding of the signs/symptoms and immediate care of hyper/hypoglycemia, proper foot care and importance of medication, aerobic/resistive exercise and nutrition plan for blood glucose control.;Long Term: Attainment of HbA1C < 7%.   Personal Goal Other Yes   Personal Goal Get stronger, strengthen legs, lose about  5-10lbs.    Intervention Attend regular 3xweek and supplement 2 more days at home   Expected Outcomes Get stronger and lose weight before graduation from program in 12 weeks.       Core Components/Risk Factors/Patient Goals Review:      Goals and Risk Factor Review    Row Name 02/07/16 1838 03/09/16 0803           Core Components/Risk Factors/Patient Goals Review   Personal Goals Review Increase Strength and Stamina;Weight Management/Obesity  Strengthen legs Weight Management/Obesity;Increase Strength and Stamina      Review Get stronger, strengthen legs and lose 5-10lbs before graduation Patient has lost 2 lbs since starting program. He has increased his strenght and feels he is getting stronger  Expected Outcomes Gain strength and stamina, lose weight Patient will continue to try and meet his weight loss goal of 5-10 lbs and will continue to increase his strenght and stamina.          Core Components/Risk Factors/Patient Goals at Discharge (Final Review):      Goals and Risk Factor Review - 03/09/16 0803      Core Components/Risk Factors/Patient Goals Review   Personal Goals Review Weight Management/Obesity;Increase Strength and Stamina   Review Patient has lost 2 lbs since starting program. He has increased his strenght and feels he is getting stronger    Expected Outcomes Patient will continue to try and meet his weight loss goal of 5-10 lbs and will continue to increase his strenght and stamina.       ITP Comments:   Comments: Patient doing well with program. Will continue to monitor for progress.

## 2016-03-10 ENCOUNTER — Encounter (HOSPITAL_COMMUNITY)
Admission: RE | Admit: 2016-03-10 | Discharge: 2016-03-10 | Disposition: A | Discharge status: Home / Self Care | Payer: Medicare Other | Source: Ambulatory Visit | Attending: Cardiovascular Disease | Admitting: Cardiovascular Disease

## 2016-03-10 DIAGNOSIS — Z951 Presence of aortocoronary bypass graft: Secondary | ICD-10-CM

## 2016-03-10 NOTE — Progress Notes (Signed)
Daily Session Note  Patient Details  Name: [name] MRN: [id number] Date of Birth: Jan 14, 1946 Referring Provider:   Flowsheet Row CARDIAC REHAB PHASE II ORIENTATION from 01/18/2016 in [place]  Referring Provider  Dr. Clent Ridges ]      Encounter Date: 03/10/2016  Check In:     Session Check In - 03/10/16 0930      Check-In   Location [facility]   Staff Present Diane Angelina Pih, MS, EP, [facility], Exercise Physiologist;Debra Wynetta Emery, RN, BSN;Treston Coker, BS, EP, Exercise Physiologist   Supervising physician immediately available to respond to emergencies See telemetry face sheet for immediately available MD   Medication changes reported     No   Fall or balance concerns reported    No   Warm-up and Cool-down Performed as group-led instruction   Resistance Training Performed Yes   VAD Patient? No     Pain Assessment   Currently in Pain? No/denies   Pain Score 0-No pain   Multiple Pain Sites No      Capillary Blood Glucose: No results found for this or any previous visit (from the past 24 hour(s)).   Goals Met:  Independence with exercise equipment Exercise tolerated well No report of cardiac concerns or symptoms Strength training completed today  Goals Unmet:  Not Applicable  Comments: Check out 1030   Dr. Emily Filbert is Medical Director for [place] and [facility].

## 2016-03-13 ENCOUNTER — Encounter (HOSPITAL_COMMUNITY)
Admission: RE | Admit: 2016-03-13 | Discharge: 2016-03-13 | Disposition: A | Discharge status: Home / Self Care | Payer: Medicare Other | Source: Ambulatory Visit | Attending: Cardiovascular Disease | Admitting: Cardiovascular Disease

## 2016-03-13 DIAGNOSIS — Z951 Presence of aortocoronary bypass graft: Secondary | ICD-10-CM | POA: Diagnosis not present

## 2016-03-13 NOTE — Progress Notes (Signed)
Daily Session Note  Patient Details  Name: [name] MRN: [id number] Date of Birth: 1946-04-30 Referring Provider:   Flowsheet Row CARDIAC REHAB PHASE II ORIENTATION from 01/18/2016 in [place]  Referring Provider  Dr. Clent Ridges ]      Encounter Date: 03/13/2016  Check In:     Session Check In - 03/13/16 0930      Check-In   Location [facility]   Staff Present Diane Angelina Pih, MS, EP, [facility], Exercise Physiologist;Debra Wynetta Emery, RN, BSN;Harpreet Signore, BS, EP, Exercise Physiologist   Supervising physician immediately available to respond to emergencies See telemetry face sheet for immediately available MD   Medication changes reported     No   Fall or balance concerns reported    No   Warm-up and Cool-down Performed as group-led instruction   Resistance Training Performed Yes   VAD Patient? No     Pain Assessment   Currently in Pain? No/denies   Pain Score 0-No pain   Multiple Pain Sites No      Capillary Blood Glucose: No results found for this or any previous visit (from the past 24 hour(s)).   Goals Met:  Independence with exercise equipment Exercise tolerated well No report of cardiac concerns or symptoms Strength training completed today  Goals Unmet:  Not Applicable  Comments: Check out 1030   Dr. Kate Sable is Medical Director for [place].

## 2016-03-15 ENCOUNTER — Encounter (HOSPITAL_COMMUNITY): Payer: Medicare Other

## 2016-03-16 ENCOUNTER — Ambulatory Visit (HOSPITAL_COMMUNITY)
Admission: RE | Admit: 2016-03-16 | Discharge: 2016-03-16 | Disposition: A | Discharge status: Home / Self Care | Payer: Medicare Other | Source: Ambulatory Visit | Attending: Cardiovascular Disease | Admitting: Cardiovascular Disease

## 2016-03-16 ENCOUNTER — Encounter (HOSPITAL_COMMUNITY): Payer: Self-pay

## 2016-03-16 DIAGNOSIS — I739 Peripheral vascular disease, unspecified: Secondary | ICD-10-CM

## 2016-03-16 DIAGNOSIS — I10 Essential (primary) hypertension: Secondary | ICD-10-CM | POA: Diagnosis not present

## 2016-03-16 DIAGNOSIS — I700 Atherosclerosis of aorta: Secondary | ICD-10-CM | POA: Diagnosis not present

## 2016-03-16 DIAGNOSIS — K219 Gastro-esophageal reflux disease without esophagitis: Secondary | ICD-10-CM | POA: Insufficient documentation

## 2016-03-16 DIAGNOSIS — I708 Atherosclerosis of other arteries: Secondary | ICD-10-CM | POA: Insufficient documentation

## 2016-03-16 DIAGNOSIS — I745 Embolism and thrombosis of iliac artery: Secondary | ICD-10-CM | POA: Insufficient documentation

## 2016-03-16 DIAGNOSIS — E7800 Pure hypercholesterolemia, unspecified: Secondary | ICD-10-CM | POA: Insufficient documentation

## 2016-03-16 DIAGNOSIS — E119 Type 2 diabetes mellitus without complications: Secondary | ICD-10-CM | POA: Diagnosis not present

## 2016-03-17 ENCOUNTER — Encounter (HOSPITAL_COMMUNITY)
Admission: RE | Admit: 2016-03-17 | Discharge: 2016-03-17 | Disposition: A | Discharge status: Home / Self Care | Payer: Medicare Other | Source: Ambulatory Visit | Attending: Cardiovascular Disease | Admitting: Cardiovascular Disease

## 2016-03-17 DIAGNOSIS — Z951 Presence of aortocoronary bypass graft: Secondary | ICD-10-CM | POA: Insufficient documentation

## 2016-03-17 NOTE — Progress Notes (Signed)
Daily Session Note  Patient Details  Name: [name] MRN: [id number] Date of Birth: 1945-10-07 Referring Provider:   Flowsheet Row CARDIAC REHAB PHASE II ORIENTATION from 01/18/2016 in [place]  Referring Provider  Dr. Clent Ridges ]      Encounter Date: 03/17/2016  Check In:     Session Check In - 03/17/16 0930      Check-In   Location [facility]   Staff Present Russella Dar, MS, EP, [facility], Exercise Physiologist;Axten Pascucci Wynetta Emery, RN, BSN   Supervising physician immediately available to respond to emergencies See telemetry face sheet for immediately available MD   Medication changes reported     No   Fall or balance concerns reported    No   Warm-up and Cool-down Performed as group-led instruction   Resistance Training Performed Yes   VAD Patient? No     Pain Assessment   Currently in Pain? No/denies   Pain Score 0-No pain   Multiple Pain Sites No      Capillary Blood Glucose: No results found for this or any previous visit (from the past 24 hour(s)).   Goals Met:  Independence with exercise equipment Exercise tolerated well No report of cardiac concerns or symptoms Strength training completed today  Goals Unmet:  Not Applicable  Comments: Check out 1030.   Dr. Kate Sable is Medical Director for [facility] Cardiac and Pulmonary Rehab.

## 2016-03-20 ENCOUNTER — Encounter (HOSPITAL_COMMUNITY)
Admission: RE | Admit: 2016-03-20 | Discharge: 2016-03-20 | Disposition: A | Discharge status: Home / Self Care | Payer: Medicare Other | Source: Ambulatory Visit | Attending: Cardiovascular Disease | Admitting: Cardiovascular Disease

## 2016-03-20 DIAGNOSIS — Z951 Presence of aortocoronary bypass graft: Secondary | ICD-10-CM | POA: Diagnosis not present

## 2016-03-20 NOTE — Progress Notes (Signed)
Daily Session Note  Patient Details  Name: [name] MRN: [id number] Date of Birth: 05-Nov-1945 Referring Provider:   Flowsheet Row CARDIAC REHAB PHASE II ORIENTATION from 01/18/2016 in [place]  Referring Provider  Dr. Clent Ridges ]      Encounter Date: 03/20/2016  Check In:     Session Check In - 03/20/16 0930      Check-In   Location [facility]   Staff Present Diane Angelina Pih, MS, EP, [facility], Exercise Physiologist;Kalila Adkison Luther Parody, BS, EP, Exercise Physiologist;Debra Wynetta Emery, RN, BSN   Supervising physician immediately available to respond to emergencies See telemetry face sheet for immediately available MD   Medication changes reported     No   Fall or balance concerns reported    No   Warm-up and Cool-down Performed as group-led instruction   Resistance Training Performed Yes   VAD Patient? No     Pain Assessment   Currently in Pain? No/denies   Pain Score 0-No pain   Multiple Pain Sites No      Capillary Blood Glucose: No results found for this or any previous visit (from the past 24 hour(s)).   Goals Met:  Independence with exercise equipment Exercise tolerated well No report of cardiac concerns or symptoms Strength training completed today  Goals Unmet:  Not Applicable  Comments: Check out 1030   Dr. Kate Sable is Medical Director for [place] and Pulmonary Rehab.

## 2016-03-22 ENCOUNTER — Encounter (HOSPITAL_COMMUNITY)
Admission: RE | Admit: 2016-03-22 | Discharge: 2016-03-22 | Disposition: A | Discharge status: Home / Self Care | Payer: Medicare Other | Source: Ambulatory Visit | Attending: Cardiovascular Disease | Admitting: Cardiovascular Disease

## 2016-03-22 DIAGNOSIS — Z951 Presence of aortocoronary bypass graft: Secondary | ICD-10-CM

## 2016-03-22 NOTE — Progress Notes (Signed)
Daily Session Note  Patient Details  Name: [name] MRN: [id number] Date of Birth: 08/23/1945 Referring Provider:   Flowsheet Row CARDIAC REHAB PHASE II ORIENTATION from 01/18/2016 in [place] [facility]  Referring Provider  Dr. Berry [Dr. Berry ]      Encounter Date: 03/22/2016  Check In:     Session Check In - 03/22/16 0930      Check-In   Location [facility]   Staff Present Diane Coad, MS, EP, [facility], Exercise Physiologist;Debra Johnson, RN, BSN;Nettie Cromwell, BS, EP, Exercise Physiologist   Supervising physician immediately available to respond to emergencies See telemetry face sheet for immediately available MD   Medication changes reported     No   Fall or balance concerns reported    No   Warm-up and Cool-down Performed as group-led instruction   Resistance Training Performed Yes   VAD Patient? No     Pain Assessment   Currently in Pain? No/denies   Pain Score 0-No pain   Multiple Pain Sites No      Capillary Blood Glucose: No results found for this or any previous visit (from the past 24 hour(s)).   Goals Met:  Independence with exercise equipment Exercise tolerated well No report of cardiac concerns or symptoms Strength training completed today  Goals Unmet:  Not Applicable  Comments: Check out 1030   Dr. Suresh Koneswaran is Medical Director for  Cardiac and Pulmonary Rehab. 

## 2016-03-24 ENCOUNTER — Encounter (HOSPITAL_COMMUNITY)
Admission: RE | Admit: 2016-03-24 | Discharge: 2016-03-24 | Disposition: A | Discharge status: Home / Self Care | Payer: Medicare Other | Source: Ambulatory Visit | Attending: Cardiovascular Disease | Admitting: Cardiovascular Disease

## 2016-03-24 DIAGNOSIS — Z951 Presence of aortocoronary bypass graft: Secondary | ICD-10-CM | POA: Diagnosis not present

## 2016-03-24 NOTE — Progress Notes (Signed)
Daily Session Note  Patient Details  Name: [name] MRN: [id number] Date of Birth: [birth year] Referring Provider:   Flowsheet Row CARDIAC REHAB PHASE II ORIENTATION from 01/18/2016 in [place]  Referring Provider  Dr. Clent Ridges ]      Encounter Date: 03/24/2016  Check In:     Session Check In - 03/24/16 0940      Check-In   Location [facility]   Staff Present Russella Dar, MS, EP, [facility], Exercise Physiologist;Debra Wynetta Emery, RN, BSN;Gregory Cowan, BS, EP, Exercise Physiologist   Supervising physician immediately available to respond to emergencies See telemetry face sheet for immediately available MD   Medication changes reported     No   Fall or balance concerns reported    No   Warm-up and Cool-down Performed as group-led instruction   Resistance Training Performed Yes   VAD Patient? No     Pain Assessment   Currently in Pain? No/denies   Pain Score 0-No pain   Multiple Pain Sites No      Capillary Blood Glucose: No results found for this or any previous visit (from the past 24 hour(s)).      Exercise Prescription Changes - 03/23/16 1200      Exercise Review   Progression Yes     Response to Exercise   Blood Pressure (Admit) 102/60   Blood Pressure (Exercise) 120/70   Blood Pressure (Exit) 108/52   Heart Rate (Admit) 66 bpm   Heart Rate (Exercise) 101 bpm   Heart Rate (Exit) 71 bpm   Rating of Perceived Exertion (Exercise) 10   Duration Progress to 30 minutes of continuous aerobic without signs/symptoms of physical distress   Intensity Rest + 30     Resistance Training   Training Prescription Yes   Weight 4   Reps 10-12     Treadmill   MPH 0   Grade 0   Minutes 0   METs 0     NuStep   Level 3   Watts 48   Minutes 20   METs 3.48     Arm Ergometer   Level 2.6   Watts 31   Minutes 15   METs 3.9     Home Exercise Plan   Plans to continue exercise at Home   Frequency Add 2 additional days to  program exercise sessions.     Goals Met:  Independence with exercise equipment Achieving weight loss No report of cardiac concerns or symptoms Strength training completed today  Goals Unmet:  Not Applicable  Comments: Check out 10:30   Dr. Kate Sable is Medical Director for [place] and Pulmonary Rehab.

## 2016-03-27 ENCOUNTER — Encounter (HOSPITAL_COMMUNITY)
Admission: RE | Admit: 2016-03-27 | Discharge: 2016-03-27 | Disposition: A | Discharge status: Home / Self Care | Payer: Medicare Other | Source: Ambulatory Visit | Attending: Cardiovascular Disease | Admitting: Cardiovascular Disease

## 2016-03-27 DIAGNOSIS — Z951 Presence of aortocoronary bypass graft: Secondary | ICD-10-CM

## 2016-03-27 NOTE — Progress Notes (Signed)
Daily Session Note  Patient Details  Name: LYRICK LAGRAND MRN: [id number] Date of Birth: Jul 09, 1946 Referring Provider:   Flowsheet Row CARDIAC REHAB PHASE II ORIENTATION from 01/18/2016 in [place]  Referring Provider  Dr. Clent Ridges ]      Encounter Date: 03/27/2016  Check In:     Session Check In - 03/27/16 0912      Check-In   Location [facility]   Staff Present Russella Dar, MS, EP, [facility], Exercise Physiologist;Joban Colledge Luther Parody, BS, EP, Exercise Physiologist   Supervising physician immediately available to respond to emergencies See telemetry face sheet for immediately available MD   Medication changes reported     No   Fall or balance concerns reported    No   Warm-up and Cool-down Performed as group-led instruction   Resistance Training Performed Yes   VAD Patient? No     Pain Assessment   Currently in Pain? No/denies   Pain Score 0-No pain   Multiple Pain Sites No      Capillary Blood Glucose: No results found for this or any previous visit (from the past 24 hour(s)).   Goals Met:  Independence with exercise equipment Exercise tolerated well No report of cardiac concerns or symptoms Strength training completed today  Goals Unmet:  Not Applicable  Comments: Check out 1015   Dr. Kate Sable is Medical Director for [place] and Pulmonary Rehab.

## 2016-03-29 ENCOUNTER — Encounter (HOSPITAL_COMMUNITY)
Admission: RE | Admit: 2016-03-29 | Discharge: 2016-03-29 | Disposition: A | Discharge status: Home / Self Care | Payer: Medicare Other | Source: Ambulatory Visit | Attending: Cardiovascular Disease | Admitting: Cardiovascular Disease

## 2016-03-29 DIAGNOSIS — Z951 Presence of aortocoronary bypass graft: Secondary | ICD-10-CM

## 2016-03-29 NOTE — Progress Notes (Signed)
Cardiac Individual Treatment Plan  Patient Details  Name: [name] MRN: [id number] Date of Birth: [birth year] Referring Provider:   Flowsheet Row CARDIAC REHAB PHASE II ORIENTATION from 01/18/2016 in [facility]  Referring Provider  Dr. Gwenlyn Found [Dr. Gwenlyn Found ]      Initial Encounter Date:  Flowsheet Row CARDIAC REHAB PHASE II ORIENTATION from 01/18/2016 in [place]  Date  01/18/16  Referring Provider  Dr. Gwenlyn Found [Dr. Gwenlyn Found ]      Visit Diagnosis: S/P CABG x 5  Patient's Home Medications on Admission:  Current Outpatient Prescriptions:  .  aspirin 81 MG tablet, Take 81 mg by mouth daily., Disp: , Rfl:  .  Cholecalciferol (VITAMIN D) 2000 UNITS tablet, Take 2,000 Units by mouth daily., Disp: , Rfl:  .  clopidogrel (PLAVIX) 75 MG tablet, Take 75 mg by mouth daily with breakfast., Disp: , Rfl:  .  ferrous sulfate 325 (65 FE) MG tablet, Take 325-650 mg by mouth 2 (two) times daily with a meal. 650mg  in the morning and 325mg  in the evening, Disp: , Rfl:  .  gabapentin (NEURONTIN) 300 MG capsule, Take 300 mg by mouth 3 (three) times daily. , Disp: , Rfl:  .  glipiZIDE (GLUCOTROL) 5 MG tablet, Take 10 mg by mouth 2 (two) times daily before a meal. , Disp: , Rfl:  .  levofloxacin (LEVAQUIN) 500 MG tablet, Take 1 tablet (500 mg total) by mouth daily., Disp: 5 tablet, Rfl: 0 .  lisinopril (PRINIVIL,ZESTRIL) 5 MG tablet, , Disp: , Rfl:  .  metFORMIN (GLUCOPHAGE) 1000 MG tablet, Take 1,000 mg by mouth 2 (two) times daily with a meal., Disp: , Rfl:  .  metoprolol (LOPRESSOR) 50 MG tablet, Take 1 tablet (50 mg total) by mouth 2 (two) times daily., Disp: 180 tablet, Rfl: 3 .  oxyCODONE (OXY IR/ROXICODONE) 5 MG immediate release tablet, Take 1-2 tablets (5-10 mg total) by mouth every 4 (four) hours as needed for severe pain., Disp: 30 tablet, Rfl: 0 .  pantoprazole (PROTONIX) 40 MG tablet, Take 40 mg by mouth daily., Disp: , Rfl:  .  simvastatin (ZOCOR) 40 MG  tablet, Take 20 mg by mouth at bedtime. , Disp: , Rfl:   Past Medical History: Past Medical History:  Diagnosis Date  . CAD S/P percutaneous coronary angioplasty 1998   PCI TO CX  . Diabetes mellitus without complication ([place])   . GERD (gastroesophageal reflux disease)   . 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 Hypercholesteremia   . Hypertension   . Neuropathy ([place])   . Peripheral vascular disease ([place])    s/p R-L FEM-FEM BYPASS    Tobacco Use: History  Smoking Status  . Former Smoker  . Packs/day: 2.00  . Years: 30.00  . Types: Cigarettes  . Quit date: 08/14/2000  Smokeless Tobacco  . Not on file    Labs: Recent Review Flowsheet Data    Labs for ITP Cardiac and Pulmonary Rehab Latest Ref Rng & Units 10/18/2015 10/18/2015 10/18/2015 10/19/2015 01/13/2016   Cholestrol 0 - 200 mg/dL - - - - -   LDLCALC 0 - 99 mg/dL - - - - -   HDL >40 mg/dL - - - - -   Trlycerides <150 mg/dL - - - - -   Hemoglobin A1c 4.8 - 5.6 % - - - - 8.4(H)   PHART 7.350 - 7.450 7.433 7.364 - - -   PCO2ART 35.0 - 45.0 mmHg  36.9 39.6 - - -   HCO3 20.0 - 24.0 mEq/L 24.7(H) 22.6 - - -   TCO2 0 - 100 mmol/L 26 24 24 24  -   ACIDBASEDEF 0.0 - 2.0 mmol/L - 3.0(H) - - -   O2SAT % 99.0 96.0 - - -      Capillary Blood Glucose: Lab Results  Component Value Date   GLUCAP 126 (H) 01/14/2016   GLUCAP 157 (H) 01/13/2016   GLUCAP 105 (H) 01/13/2016   GLUCAP 219 (H) 01/13/2016   GLUCAP 138 (H) 01/13/2016     Exercise Target Goals:    Exercise Program Goal: Individual exercise prescription set with THRR, safety & activity barriers. Participant demonstrates ability to understand and report RPE using BORG scale, to self-measure pulse accurately, and to acknowledge the importance of the exercise prescription.  Exercise Prescription Goal: Starting with aerobic activity 30 plus minutes a day, 3 days per week for initial exercise prescription. Provide home exercise prescription and guidelines that participant acknowledges  understanding prior to discharge.  Activity Barriers & Risk Stratification:     Activity Barriers & Cardiac Risk Stratification - 01/18/16 1739      Activity Barriers & Cardiac Risk Stratification   Activity Barriers Balance Concerns  Neuropathy resulting in (R) leg pain and weakness   Cardiac Risk Stratification High      6 Minute Walk:     6 Minute Walk    Row Name 01/19/16 1336         6 Minute Walk   Phase Initial     Distance 900 feet     Walk Time 6 minutes     # of Rest Breaks 0     MPH 1.7     METS 2.3     RPE 15     Perceived Dyspnea  9     VO2 Peak 9.01     Symptoms Yes (comment)     Comments Patient c/o pain at end of the test his legs, more in (R) leg, 8/10.  After a 2 minute rest period, pain was 2/10. Pain was gone before patient left at the end of orientation.      Resting HR 102 bpm     Resting BP 92/48     Max Ex. HR 91 bpm     Max Ex. BP 150/40     2 Minute Post BP 92/40        Initial Exercise Prescription:     Initial Exercise Prescription - 01/19/16 1300      Date of Initial Exercise RX and Referring Provider   Date 01/18/16   Referring Provider Dr. Gwenlyn Found  Dr. Gwenlyn Found      Treadmill   MPH 1.3   Grade 0   Minutes 15   METs 1.9     NuStep   Level 2   Watts 18   Minutes 15   METs 1.9     Arm Ergometer   Level 2   Watts 18   Minutes 15   METs 2.3     Prescription Details   Frequency (times per week) 3   Duration Progress to 45 minutes of aerobic exercise without signs/symptoms of physical distress     Intensity   THRR REST +  30   THRR 40-80% of Max Heartrate [phone]   Ratings of Perceived Exertion 11-13   Perceived Dyspnea 0-4     Progression   Progression Continue to progress workloads to maintain intensity without signs/symptoms of  physical distress.     Resistance Training   Training Prescription Yes   Weight 1   Reps 10-12      Perform Capillary Blood Glucose checks as needed.  Exercise Prescription  Changes:      Exercise Prescription Changes    Row Name 02/07/16 1800 02/14/16 1400 03/03/16 1300 03/23/16 1200       Exercise Review   Progression Yes Yes Yes Yes      Response to Exercise   Blood Pressure (Admit) 110/50 116/50 104/62 102/60    Blood Pressure (Exercise) 142/40 116/50 110/55 120/70    Blood Pressure (Exit) 120/42 98/50 110/50 108/52    Heart Rate (Admit) 79 bpm 73 bpm 68 bpm 66 bpm    Heart Rate (Exercise) 109 bpm 95 bpm 90 bpm 101 bpm    Heart Rate (Exit) 81 bpm 74 bpm 76 bpm 71 bpm    Rating of Perceived Exertion (Exercise) 11 11 11 10     Comments Have not put him on TM yet. Strengthening his legs for a few more sessions and will then transition him to the TM. Have not put him on TM yet. Strengthening his legs for a few more sessions and will then transition him to the TM. Have not put him on TM yet. Strengthening his legs for a few more sessions and will then transition him to the TM.  -    Duration Progress to 30 minutes of continuous aerobic without signs/symptoms of physical distress Progress to 30 minutes of continuous aerobic without signs/symptoms of physical distress Progress to 30 minutes of continuous aerobic without signs/symptoms of physical distress Progress to 30 minutes of continuous aerobic without signs/symptoms of physical distress    Intensity Rest + 30 Rest + 30 Rest + 30 Rest + 30      Resistance Training   Training Prescription Yes Yes Yes Yes    Weight 2 2 3 4     Reps 10-12 10-12 10-12 10-12      Treadmill   MPH 0 0 0 0    Grade 0 0 0 0    Minutes 0 0 0 0    METs 0 0 0 0      NuStep   Level 2 2 3 3     Watts 31 32 39 48    Minutes 15 20 20 20     METs 3.48 3.48 3.47 3.48      Arm Ergometer   Level 2 2 2.3 2.6    Watts 19 21 24 31     Minutes 20 15 15 15     METs 2.9 3.1 3.3 3.9      Home Exercise Plan   Plans to continue exercise at [place]    Frequency Add 2 additional days to program exercise sessions. Add 2  additional days to program exercise sessions. Add 2 additional days to program exercise sessions. Add 2 additional days to program exercise sessions.       Exercise Comments:      Exercise Comments    Row Name 03/08/16 0919 03/23/16 1224         Exercise Comments Patient is proggressing appropriately  Patient is progressing appropriately           Discharge Exercise Prescription (Final Exercise Prescription Changes):     Exercise Prescription Changes - 03/23/16 1200      Exercise Review   Progression Yes     Response to Exercise   Blood Pressure (Admit)  102/60   Blood Pressure (Exercise) 120/70   Blood Pressure (Exit) 108/52   Heart Rate (Admit) 66 bpm   Heart Rate (Exercise) 101 bpm   Heart Rate (Exit) 71 bpm   Rating of Perceived Exertion (Exercise) 10   Duration Progress to 30 minutes of continuous aerobic without signs/symptoms of physical distress   Intensity Rest + 30     Resistance Training   Training Prescription Yes   Weight 4   Reps 10-12     Treadmill   MPH 0   Grade 0   Minutes 0   METs 0     NuStep   Level 3   Watts 48   Minutes 20   METs 3.48     Arm Ergometer   Level 2.6   Watts 31   Minutes 15   METs 3.9     Home Exercise Plan   Plans to continue exercise at Home   Frequency Add 2 additional days to program exercise sessions.      Nutrition:  Target Goals: Understanding of nutrition guidelines, daily intake of sodium 1500mg , cholesterol 200mg , calories 30% from fat and 7% or less from saturated fats, daily to have 5 or more servings of fruits and vegetables.  Biometrics:     Pre Biometrics - 01/19/16 1343      Pre Biometrics   Height 5\' 8"  (1.727 m)   Weight 158 lb 8.2 oz (71.9 kg)   Waist Circumference 41 inches   Hip Circumference 37.5 inches   Waist to Hip Ratio 1.09 %   BMI (Calculated) 24.2   Triceps Skinfold 11 mm   % Body Fat 25.5 %   Grip Strength 74 kg   Flexibility 16 in   Single Leg Stand 7 seconds        Nutrition Therapy Plan and Nutrition Goals:   Nutrition Discharge: Rate Your Plate Scores:     Nutrition Assessments - 01/18/16 1742      MEDFICTS Scores   Pre Score 39      Nutrition Goals Re-Evaluation:   Psychosocial: Target Goals: Acknowledge presence or absence of depression, maximize coping skills, provide positive support system. Participant is able to verbalize types and ability to use techniques and skills needed for reducing stress and depression.  Initial Review & Psychosocial Screening:     Initial Psych Review & Screening - 01/18/16 1752      Initial Review   Current issues with --  None     Family Dynamics   Good Support System? Yes     Barriers   Psychosocial barriers to participate in program There are no identifiable barriers or psychosocial needs.     Screening Interventions   Interventions Encouraged to exercise      Quality of Life Scores:     Quality of Life - 01/19/16 1345      Quality of Life Scores   Health/Function Pre 30 %   Socioeconomic Pre 30 %   Psych/Spiritual Pre 30 %   Family Pre 30 %   GLOBAL Pre 30 %      PHQ-9: Recent Review Flowsheet Data    Depression screen [facility] 2/9 01/18/2016   Decreased Interest 0   Down, Depressed, Hopeless 0   PHQ - 2 Score 0   Altered sleeping 0   Tired, decreased energy 0   Change in appetite 2   Feeling bad or failure about yourself  0   Trouble concentrating 0   Moving slowly or  fidgety/restless 0   Suicidal thoughts 0   PHQ-9 Score 2   Difficult doing work/chores Not difficult at all      Psychosocial Evaluation and Intervention:   Psychosocial Re-Evaluation:     Psychosocial Re-Evaluation    [place] Name 02/07/16 1839 03/09/16 0805 03/29/16 1440         Psychosocial Re-Evaluation   Interventions Encouraged to attend Cardiac Rehabilitation for the exercise  - Encouraged to attend Cardiac Rehabilitation for the exercise     Comments  - Patient has no psychosocial  issues. His QOL score was 30.0 and his PHQ-9 score was 2. He does not need counseling.  Patient's QOL score was 30. He has no psychosocial issues and does not need counseling.      Continued Psychosocial Services Needed No No No        Vocational Rehabilitation: Provide vocational rehab assistance to qualifying candidates.   Vocational Rehab Evaluation & Intervention:     Vocational Rehab - 01/18/16 1741      Initial Vocational Rehab Evaluation & Intervention   Assessment shows need for Vocational Rehabilitation No      Education: Education Goals: Education classes will be provided on a weekly basis, covering required topics. Participant will state understanding/return demonstration of topics presented.  Learning Barriers/Preferences:     Learning Barriers/Preferences - 01/18/16 1740      Learning Barriers/Preferences   Learning Barriers Reading  Does not read well   Learning Preferences Video;Skilled Demonstration      Education Topics: Hypertension, Hypertension Reduction -Define heart disease and high blood pressure. Discus how high blood pressure affects the body and ways to reduce high blood pressure.   Exercise and Your Heart -Discuss why it is important to exercise, the FITT principles of exercise, normal and abnormal responses to exercise, and how to exercise safely. Flowsheet Row CARDIAC REHAB PHASE II EXERCISE from 03/22/2016 in [place]  Date  03/22/16  Educator  DC  Instruction Review Code  2- meets goals/outcomes      Angina -Discuss definition of angina, causes of angina, treatment of angina, and how to decrease risk of having angina.   Cardiac Medications -Review what the following cardiac medications are used for, how they affect the body, and side effects that may occur when taking the medications.  Medications include Aspirin, Beta blockers, calcium channel blockers, ACE Inhibitors, angiotensin receptor blockers, diuretics,  digoxin, and antihyperlipidemics.   Congestive Heart Failure -Discuss the definition of CHF, how to live with CHF, the signs and symptoms of CHF, and how keep track of weight and sodium intake.   Heart Disease and Intimacy -Discus the effect sexual activity has on the heart, how changes occur during intimacy as we age, and safety during sexual activity.   Smoking Cessation / COPD -Discuss different methods to quit smoking, the health benefits of quitting smoking, and the definition of COPD. Flowsheet Row CARDIAC REHAB PHASE II EXERCISE from 03/22/2016 in [place]  Date  01/26/16  Educator  Russella Dar  Instruction Review Code  2- meets goals/outcomes      Nutrition I: Fats -Discuss the types of cholesterol, what cholesterol does to the heart, and how cholesterol levels can be controlled. Flowsheet Row CARDIAC REHAB PHASE II EXERCISE from 03/22/2016 in [facility] [place]  Date  02/02/16  Educator  Russella Dar  Instruction Review Code  2- meets goals/outcomes      Nutrition II: Labels -Discuss the different components of food labels  and how to read food label [place] from 03/22/2016 in [place]  Date  02/09/16  Educator  Russella Dar  Instruction Review Code  2- meets goals/outcomes      Heart Parts and Heart Disease -Discuss the anatomy of the heart, the pathway of blood circulation through the heart, and these are affected by heart disease. Flowsheet Row CARDIAC REHAB PHASE II EXERCISE from 03/22/2016 in [facility]  Date  02/16/16  Educator  DC  Instruction Review Code  2- meets goals/outcomes      Stress I: Signs and Symptoms -Discuss the causes of stress, how stress may lead to anxiety and depression, and ways to limit stress. Flowsheet Row CARDIAC REHAB PHASE II EXERCISE from 03/22/2016 in [facility]  Date  02/23/16  Educator  DJ   Instruction Review Code  2- meets goals/outcomes      Stress II: Relaxation -Discuss different types of relaxation techniques to limit stress.   Warning Signs of Stroke / TIA -Discuss definition of a stroke, what the signs and symptoms are of a stroke, and how to identify when someone is having stroke. Flowsheet Row CARDIAC REHAB PHASE II EXERCISE from 03/22/2016 in [facility] [place]  Date  03/08/16  Educator  DC/DJ  Instruction Review Code  2- meets goals/outcomes      Knowledge Questionnaire Score:     Knowledge Questionnaire Score - 01/18/16 1741      Knowledge Questionnaire Score   Pre Score 17/24      Core Components/Risk Factors/Patient Goals at Admission:     Personal Goals and Risk Factors at Admission - 01/18/16 1742      Core Components/Risk Factors/Patient Goals on Admission    Weight Management Yes   Intervention Weight Management/Obesity: Establish reasonable short term and long term weight goals.   Admit Weight 158 lb 9.6 oz (71.9 kg)   Goal Weight: Short Term 153 lb 9.6 oz (69.7 kg)   Goal Weight: Long Term 148 lb 9.6 oz (67.4 kg)   Expected Outcomes Short Term: Continue to assess and modify interventions until short term weight is achieved;Long Term: Adherence to nutrition and physical activity/exercise program aimed toward attainment of established weight goal   Sedentary Yes   Intervention Provide advice, education, support and counseling about physical activity/exercise needs.;Develop an individualized exercise prescription for aerobic and resistive training based on initial evaluation findings, risk stratification, comorbidities and participant's personal goals.   Expected Outcomes Achievement of increased cardiorespiratory fitness and enhanced flexibility, muscular endurance and strength shown through measurements of functional capacity and personal statement of participant.   Increase Strength and Stamina Yes   Intervention Provide  advice, education, support and counseling about physical activity/exercise needs.;Develop an individualized exercise prescription for aerobic and resistive training based on initial evaluation findings, risk stratification, comorbidities and participant's personal goals.   Diabetes Yes   Expected Outcomes Short Term: Participant verbalizes understanding of the signs/symptoms and immediate care of hyper/hypoglycemia, proper foot care and importance of medication, aerobic/resistive exercise and nutrition plan for blood glucose control.;Long Term: Attainment of HbA1C < 7%.   Personal Goal Other Yes   Personal Goal Get stronger, strengthen legs, lose about 5-10lbs.    Intervention Attend regular 3xweek and supplement 2 more days at home   Expected Outcomes Get stronger and lose weight before graduation from program in 12 weeks.       Core Components/Risk Factors/Patient Goals Review:  Goals and Risk Factor Review    Row Name 02/07/16 1838 03/09/16 0803 03/29/16 1436         Core Components/Risk Factors/Patient Goals Review   Personal Goals Review Increase Strength and Stamina;Weight Management/Obesity  Strengthen legs Weight Management/Obesity;Increase Strength and Stamina Weight Management/Obesity;Increase Strength and Stamina     Review Get stronger, strengthen legs and lose 5-10lbs before graduation Patient has lost 2 lbs since starting program. He has increased his strenght and feels he is getting stronger  Patient has maintained his weight after 27 sessions. Patient is progressing well and has increased his stamina and strenght.      Expected Outcomes Gain strength and stamina, lose weight Patient will continue to try and meet his weight loss goal of 5-10 lbs and will continue to increase his strenght and stamina.  Patient will continue to work toward meeting the above stated goals.         Core Components/Risk Factors/Patient Goals at Discharge (Final Review):      Goals and Risk  Factor Review - 03/29/16 1436      Core Components/Risk Factors/Patient Goals Review   Personal Goals Review Weight Management/Obesity;Increase Strength and Stamina   Review Patient has maintained his weight after 27 sessions. Patient is progressing well and has increased his stamina and strenght.    Expected Outcomes Patient will continue to work toward meeting the above stated goals.       ITP Comments:   Comments: Patient doing well with program. Will continue to monitor for progress.

## 2016-03-29 NOTE — Progress Notes (Signed)
Daily Session Note  Patient Details  Name: [name] MRN: [id number] Date of Birth: 13-Sep-1945 Referring Provider:   Flowsheet Row CARDIAC REHAB PHASE II ORIENTATION from 01/18/2016 in [place]  Referring Provider  Dr. Clent Ridges ]      Encounter Date: 03/29/2016  Check In:     Session Check In - 03/29/16 0930      Check-In   Location [facility]   Staff Present Russella Dar, MS, EP, [facility], Exercise Physiologist;Vance Hochmuth Wynetta Emery, RN, BSN   Supervising physician immediately available to respond to emergencies See telemetry face sheet for immediately available MD   Medication changes reported     No   Fall or balance concerns reported    No   Warm-up and Cool-down Performed as group-led instruction   Resistance Training Performed Yes   VAD Patient? No     Pain Assessment   Currently in Pain? No/denies   Pain Score 0-No pain   Multiple Pain Sites No      Capillary Blood Glucose: No results found for this or any previous visit (from the past 24 hour(s)).   Goals Met:  Independence with exercise equipment Exercise tolerated well No report of cardiac concerns or symptoms Strength training completed today  Goals Unmet:  Not Applicable  Comments: Check out 1030.   Dr. Kate Sable is Medical Director for [facility] Cardiac and Pulmonary Rehab.

## 2016-03-30 ENCOUNTER — Ambulatory Visit: Payer: Medicare Other | Admitting: Cardiology

## 2016-03-31 ENCOUNTER — Encounter (HOSPITAL_COMMUNITY)
Admission: RE | Admit: 2016-03-31 | Discharge: 2016-03-31 | Disposition: A | Discharge status: Home / Self Care | Payer: Medicare Other | Source: Ambulatory Visit | Attending: Cardiovascular Disease | Admitting: Cardiovascular Disease

## 2016-03-31 DIAGNOSIS — Z951 Presence of aortocoronary bypass graft: Secondary | ICD-10-CM | POA: Diagnosis not present

## 2016-03-31 NOTE — Progress Notes (Signed)
Daily Session Note  Patient Details  Name: STALEY LUNZ MRN: [id number] Date of Birth: 30-Apr-1946 Referring Provider:   Flowsheet Row CARDIAC REHAB PHASE II ORIENTATION from 01/18/2016 in [place]  Referring Provider  Dr. Clent Ridges ]      Encounter Date: 03/31/2016  Check In:     Session Check In - 03/31/16 0930      Check-In   Location [facility]   Staff Present Suzanne Boron, BS, EP, Exercise Physiologist;[place] Yarbro Wynetta Emery, RN, BSN;Diane Coad, MS, EP, [facility], Exercise Physiologist   Supervising physician immediately available to respond to emergencies See telemetry face sheet for immediately available MD   Medication changes reported     No   Fall or balance concerns reported    No   Warm-up and Cool-down Performed as group-led instruction   Resistance Training Performed Yes   VAD Patient? No     Pain Assessment   Currently in Pain? No/denies   Pain Score 0-No pain   Multiple Pain Sites No      Capillary Blood Glucose: No results found for this or any previous visit (from the past 24 hour(s)).   Goals Met:  Independence with exercise equipment Exercise tolerated well No report of cardiac concerns or symptoms Strength training completed today  Goals Unmet:  Not Applicable  Comments: Check out 1030.   Dr. Kate Sable is Medical Director for [facility] Cardiac and Pulmonary Rehab.

## 2016-04-03 ENCOUNTER — Encounter (HOSPITAL_COMMUNITY): Admission: RE | Admit: 2016-04-03 | Payer: Medicare Other | Source: Ambulatory Visit

## 2016-04-04 ENCOUNTER — Other Ambulatory Visit: Payer: Self-pay | Admitting: *Deleted

## 2016-04-04 ENCOUNTER — Ambulatory Visit (INDEPENDENT_AMBULATORY_CARE_PROVIDER_SITE_OTHER): Payer: Medicare Other | Admitting: Cardiovascular Disease

## 2016-04-04 ENCOUNTER — Encounter: Payer: Self-pay | Admitting: Cardiovascular Disease

## 2016-04-04 ENCOUNTER — Encounter (INDEPENDENT_AMBULATORY_CARE_PROVIDER_SITE_OTHER): Payer: Self-pay

## 2016-04-04 VITALS — BP 102/42 | HR 86 | Ht 68.0 in | Wt 160.0 lb

## 2016-04-04 DIAGNOSIS — I739 Peripheral vascular disease, unspecified: Secondary | ICD-10-CM

## 2016-04-04 DIAGNOSIS — D689 Coagulation defect, unspecified: Secondary | ICD-10-CM | POA: Diagnosis not present

## 2016-04-04 DIAGNOSIS — Z01818 Encounter for other preprocedural examination: Secondary | ICD-10-CM | POA: Diagnosis not present

## 2016-04-04 DIAGNOSIS — Z79899 Other long term (current) drug therapy: Secondary | ICD-10-CM | POA: Diagnosis not present

## 2016-04-04 NOTE — Patient Instructions (Signed)
Medication Instructions:  Your physician recommends that you continue on your current medications as directed. Please refer to the Current Medication list given to you today.  PRIOR TO THE PROCEDURE: 1- DO NOT TAKE YOUR Metformin the day before or morning of the procedure. 2- DO NOT TAKE YOUR Glipizide the morning of the procedure.   Testing/Procedures: Dr. Gwenlyn Found has ordered a peripheral angiogram to be done at [facility].  This procedure is going to look at the bloodflow in your lower extremities.  If Dr. Gwenlyn Found is able to open up the arteries, you will have to spend one night in the hospital.  If he is not able to open the arteries, you will be able to go home that same day.    After the procedure, you will not be allowed to drive for 3 days or push, pull, or lift anything greater than 10 lbs for one week.    You will be required to have the following tests prior to the procedure:  1. Blood work-the blood work can be done no more than 14 days prior to the procedure.  It can be done at any [facility] lab.  There is one downstairs on the first floor of this building and one in the [facility] building [phone] [address], [address])  2. Chest Xray-the chest xray order has already been placed at the [facility].     *REPS  None per Dr Gwenlyn Found   Puncture site RIGHT GROIN   If you need a refill on your cardiac medications before your next appointment, please call your pharmacy.

## 2016-04-04 NOTE — Assessment & Plan Note (Signed)
[name] returns for follow-up of his Doppler studies. He has had multiple interventions on his lower extremities by myself as well as left femoropopliteal bypass grafting by Dr. Amedeo Plenty in the 90s. He complains of severe lifestyle limiting claudication. 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 His right ABI was 0.69 and left was 0.88. We decided to proceed with outpatient peripheral angiography sometime in the next several weeks.

## 2016-04-04 NOTE — Progress Notes (Signed)
04/04/2016 KYSEAN WITMER   [birth year]  VU:[id number]  Primary Physician Pcp Not In System Primary Cardiologist: Lorretta Harp MD Renae Gloss  HPI:  Mr. Keomany is a delightful 70 year old thin appearing married Caucasian male father of 2 children who I took care of for years and last saw in the office 02/08/16. Accompanied by his wife today. He has a history of CAD status post circumflex stenting by myself at 2003. He also has a history of peripheral vascular disease status posts grafting of his lower extremities by Dr. Amedeo Plenty in the 90s. I performed stenting of his right lower extremity multiple times. He stopped smoking remotely. Kilograms include treated hypertension, hyperlipidemia and type 2 diabetes. He was admitted in February with unstable angina and underwent cardiac catheterization by Dr. Martinique 10/11/15 revealing three-vessel disease. He ultimately underwent coronary artery bypass grafting on 10/18/15 by Dr. Roxan Hockey is a LIMA to his LAD, vein to diagonal branch, obtuse marginal branches 1 and 2 and the distal RCA. His postop course was unremarkable. 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 His right ABI was 0.6 by the left was 0.88. He is more symptomatically the right and does have lifestyle limiting claudication.    Current Outpatient Prescriptions  Medication Sig Dispense Refill  . aspirin 81 MG tablet Take 81 mg by mouth daily.    . Cholecalciferol (VITAMIN D) 2000 UNITS tablet Take 2,000 Units by mouth daily.    . clopidogrel (PLAVIX) 75 MG tablet Take 75 mg by mouth daily with breakfast.    . ferrous sulfate 325 (65 FE) MG tablet Take 325-650 mg by mouth 2 (two) times daily with a meal. 650mg  in the morning and 325mg  in the evening    . gabapentin (NEURONTIN) 300 MG capsule Take 300 mg by mouth 3 (three) times daily.      [name] glipiZIDE (GLUCOTROL) 5 MG tablet Take 10 mg by mouth 2 (two) times daily before a meal.     . levofloxacin (LEVAQUIN) 500 MG tablet Take 1 tablet (500 mg total) by mouth daily. 5 tablet 0  . lisinopril (PRINIVIL,ZESTRIL) 5 MG tablet     . metFORMIN (GLUCOPHAGE) 1000 MG tablet Take 1,000 mg by mouth 2 (two) times daily with a meal.    . metoprolol (LOPRESSOR) 50 MG tablet Take 1 tablet (50 mg total) by mouth 2 (two) times daily. 180 tablet 3  . oxyCODONE (OXY IR/ROXICODONE) 5 MG immediate release tablet Take 1-2 tablets (5-10 mg total) by mouth every 4 (four) hours as needed for severe pain. 30 tablet 0  . pantoprazole (PROTONIX) 40 MG tablet Take 40 mg by mouth daily.    . simvastatin (ZOCOR) 40 MG tablet Take 20 mg by mouth at bedtime.      No current facility-administered medications for this visit.     No Known Allergies  Social History   Social History  . Marital status: Married    Spouse name: N/A  . Number of children: N/A  . Years of education: N/A   Occupational History  . Not on file.   Social History Main Topics  . Smoking status: Former Smoker    Packs/day: 2.00    Years: 30.00    Types: Cigarettes    Quit date: 08/14/2000  . Smokeless tobacco: Never Used  . Alcohol use No  . Drug use: No  .  Sexual activity: Not on file   Other Topics Concern  . Not on file   Social History Narrative  . No narrative on file     Review of Systems: General: negative for chills, fever, night sweats or weight changes.  Cardiovascular: negative for chest pain, dyspnea on exertion, edema, orthopnea, palpitations, paroxysmal nocturnal dyspnea or shortness of breath Dermatological: negative for rash Respiratory: negative for cough or wheezing Urologic: negative for hematuria Abdominal: negative for nausea, vomiting, diarrhea, bright red blood per rectum, melena, or hematemesis Neurologic: negative for visual changes, syncope, or dizziness All other systems reviewed and are  otherwise negative except as noted above.    Blood pressure (!) 102/42, pulse 86, height 5\' 8"  (1.727 m), weight 160 lb (72.6 kg).  General appearance: alert and no distress Neck: no adenopathy, no carotid bruit, no JVD, supple, symmetrical, trachea midline and thyroid not enlarged, symmetric, no tenderness/mass/nodules Lungs: clear to auscultation bilaterally Heart: regular rate and rhythm, S1, S2 normal, no murmur, click, rub or gallop Extremities: Absent pulse on the left with a soft bruit, 1+ pulse on the right with a laboratory  EKG not performed today  ASSESSMENT AND PLAN:   PVD (peripheral vascular disease) ([place]) Mr. Reichenberger returns for follow-up of his Doppler studies. He has had multiple interventions on his lower extremities by myself as well as left femoropopliteal bypass grafting by Dr. Amedeo Plenty in the 90s. He complains of severe lifestyle limiting claudication. 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 His right ABI was 0.69 and left was 0.88. We decided to proceed with outpatient peripheral angiography sometime in the next several weeks.      Lorretta Harp MD FACP,FACC,FAHA, [facility] 04/04/2016 1:49 PM

## 2016-04-05 ENCOUNTER — Encounter (HOSPITAL_COMMUNITY)
Admission: RE | Admit: 2016-04-05 | Discharge: 2016-04-05 | Disposition: A | Discharge status: Home / Self Care | Payer: Medicare Other | Source: Ambulatory Visit | Attending: Cardiovascular Disease | Admitting: Cardiovascular Disease

## 2016-04-05 DIAGNOSIS — Z951 Presence of aortocoronary bypass graft: Secondary | ICD-10-CM | POA: Diagnosis not present

## 2016-04-05 NOTE — Progress Notes (Signed)
Daily Session Note  Patient Details  Name: SHADD DUNSTAN MRN: [id number] Date of Birth: 1945-11-01 Referring Provider:   Flowsheet Row CARDIAC REHAB PHASE II ORIENTATION from 01/18/2016 in [place]  Referring Provider  Dr. Clent Ridges ]      Encounter Date: 04/05/2016  Check In:     Session Check In - 04/05/16 0930      Check-In   Location [facility]   Staff Present Diane Angelina Pih, MS, EP, [facility], Exercise Physiologist;Debra Wynetta Emery, RN, BSN;Ermalee Mealy, BS, EP, Exercise Physiologist   Supervising physician immediately available to respond to emergencies See telemetry face sheet for immediately available MD   Medication changes reported     No   Fall or balance concerns reported    No   Warm-up and Cool-down Performed as group-led instruction   Resistance Training Performed Yes   VAD Patient? No     Pain Assessment   Currently in Pain? No/denies   Pain Score 0-No pain   Multiple Pain Sites No      Capillary Blood Glucose: No results found for this or any previous visit (from the past 24 hour(s)).   Goals Met:  Independence with exercise equipment Exercise tolerated well No report of cardiac concerns or symptoms Strength training completed today  Goals Unmet:  Not Applicable  Comments: Check out 1030   Dr. Kate Sable is Medical Director for [place] and Pulmonary Rehab.

## 2016-04-07 ENCOUNTER — Encounter (HOSPITAL_COMMUNITY)
Admission: RE | Admit: 2016-04-07 | Discharge: 2016-04-07 | Disposition: A | Discharge status: Home / Self Care | Payer: Medicare Other | Source: Ambulatory Visit | Attending: Cardiovascular Disease | Admitting: Cardiovascular Disease

## 2016-04-07 DIAGNOSIS — Z951 Presence of aortocoronary bypass graft: Secondary | ICD-10-CM | POA: Diagnosis not present

## 2016-04-07 NOTE — Progress Notes (Signed)
Daily Session Note  Patient Details  Name: DEZMAN GRANDA MRN: [id number] Date of Birth: [birth year] Referring Provider:   Flowsheet Row CARDIAC REHAB PHASE II ORIENTATION from 01/18/2016 in [place]  Referring Provider  Dr. Clent Ridges ]      Encounter Date: 04/07/2016  Check In:     Session Check In - 04/07/16 0930      Check-In   Location [facility]   Staff Present Diane Angelina Pih, MS, EP, [facility], Exercise Physiologist;Debra Wynetta Emery, RN, BSN;Hanako Tipping, BS, EP, Exercise Physiologist   Supervising physician immediately available to respond to emergencies See telemetry face sheet for immediately available MD   Medication changes reported     No   Fall or balance concerns reported    No   Warm-up and Cool-down Performed as group-led instruction   Resistance Training Performed Yes   VAD Patient? No     Pain Assessment   Currently in Pain? No/denies   Pain Score 0-No pain   Multiple Pain Sites No      Capillary Blood Glucose: No results found for this or any previous visit (from the past 24 hour(s)).   Goals Met:  Independence with exercise equipment Exercise tolerated well No report of cardiac concerns or symptoms Strength training completed today  Goals Unmet:  Not Applicable  Comments: Check out 1030   Dr. Kate Sable is Medical Director for [place] and Pulmonary Rehab.

## 2016-04-10 ENCOUNTER — Encounter (HOSPITAL_COMMUNITY)
Admission: RE | Admit: 2016-04-10 | Discharge: 2016-04-10 | Disposition: A | Discharge status: Home / Self Care | Payer: Medicare Other | Source: Ambulatory Visit | Attending: Cardiovascular Disease | Admitting: Cardiovascular Disease

## 2016-04-10 DIAGNOSIS — Z951 Presence of aortocoronary bypass graft: Secondary | ICD-10-CM | POA: Diagnosis not present

## 2016-04-10 NOTE — Progress Notes (Signed)
Daily Session Note  Patient Details  Name: [name] MRN: [id number] Date of Birth: 02-09-1946 Referring Provider:   Flowsheet Row CARDIAC REHAB PHASE II ORIENTATION from 01/18/2016 in [place]  Referring Provider  Dr. Clent Ridges ]      Encounter Date: 04/10/2016  Check In:     Session Check In - 04/10/16 0930      Check-In   Location [facility]   Staff Present Russella Dar, MS, EP, [facility], Exercise Physiologist;Traveion Ruddock Luther Parody, BS, EP, Exercise Physiologist   Supervising physician immediately available to respond to emergencies See telemetry face sheet for immediately available MD   Medication changes reported     No   Fall or balance concerns reported    No   Warm-up and Cool-down Performed as group-led instruction   Resistance Training Performed Yes   VAD Patient? No     Pain Assessment   Currently in Pain? No/denies   Pain Score 0-No pain   Multiple Pain Sites No      Capillary Blood Glucose: No results found for this or any previous visit (from the past 24 hour(s)).   Goals Met:  Independence with exercise equipment Exercise tolerated well No report of cardiac concerns or symptoms Strength training completed today  Goals Unmet:  Not Applicable  Comments: Check out 1030   Dr. Kate Sable is Medical Director for [place] and Pulmonary Rehab.

## 2016-04-11 ENCOUNTER — Ambulatory Visit
Admission: RE | Admit: 2016-04-11 | Discharge: 2016-04-11 | Disposition: A | Discharge status: Home / Self Care | Payer: Medicare Other | Source: Ambulatory Visit | Attending: Cardiovascular Disease | Admitting: Cardiovascular Disease

## 2016-04-11 DIAGNOSIS — Z79899 Other long term (current) drug therapy: Secondary | ICD-10-CM

## 2016-04-11 DIAGNOSIS — D689 Coagulation defect, unspecified: Secondary | ICD-10-CM

## 2016-04-11 DIAGNOSIS — Z01818 Encounter for other preprocedural examination: Secondary | ICD-10-CM

## 2016-04-11 DIAGNOSIS — I739 Peripheral vascular disease, unspecified: Secondary | ICD-10-CM

## 2016-04-11 DIAGNOSIS — R918 Other nonspecific abnormal finding of lung field: Secondary | ICD-10-CM | POA: Diagnosis not present

## 2016-04-12 ENCOUNTER — Encounter (HOSPITAL_COMMUNITY)
Admission: RE | Admit: 2016-04-12 | Discharge: 2016-04-12 | Disposition: A | Discharge status: Home / Self Care | Payer: Medicare Other | Source: Ambulatory Visit | Attending: Cardiovascular Disease | Admitting: Cardiovascular Disease

## 2016-04-12 DIAGNOSIS — Z951 Presence of aortocoronary bypass graft: Secondary | ICD-10-CM

## 2016-04-12 NOTE — Progress Notes (Signed)
Daily Session Note  Patient Details  Name: [name] MRN: [id number] Date of Birth: March 07, 1946 Referring Provider:   Flowsheet Row CARDIAC REHAB PHASE II ORIENTATION from 01/18/2016 in [place]  Referring Provider  Dr. Clent Ridges ]      Encounter Date: 04/12/2016  Check In:     Session Check In - 04/12/16 0930      Check-In   Location [facility]   Staff Present Diane Angelina Pih, MS, EP, [facility], Exercise Physiologist;Debra Wynetta Emery, RN, BSN;Nare Gaspari, BS, EP, Exercise Physiologist   Supervising physician immediately available to respond to emergencies See telemetry face sheet for immediately available MD   Medication changes reported     No   Fall or balance concerns reported    No   Warm-up and Cool-down Performed as group-led instruction   Resistance Training Performed Yes   VAD Patient? No     Pain Assessment   Currently in Pain? No/denies   Pain Score 0-No pain   Multiple Pain Sites No      Capillary Blood Glucose: No results found for this or any previous visit (from the past 24 hour(s)).   Goals Met:  Independence with exercise equipment Exercise tolerated well No report of cardiac concerns or symptoms Strength training completed today  Goals Unmet:  Not Applicable  Comments: Check out 1030   Dr. Kate Sable is Medical Director for [place] and Pulmonary Rehab.

## 2016-04-14 ENCOUNTER — Encounter (HOSPITAL_COMMUNITY)
Admission: RE | Admit: 2016-04-14 | Discharge: 2016-04-14 | Disposition: A | Discharge status: Home / Self Care | Payer: Medicare Other | Source: Ambulatory Visit | Attending: Cardiovascular Disease | Admitting: Cardiovascular Disease

## 2016-04-14 DIAGNOSIS — Z951 Presence of aortocoronary bypass graft: Secondary | ICD-10-CM | POA: Diagnosis not present

## 2016-04-14 NOTE — Progress Notes (Signed)
Daily Session Note  Patient Details  Name: [name] MRN: [id number] Date of Birth: [birth year] Referring Provider:   Flowsheet Row CARDIAC REHAB PHASE II ORIENTATION from 01/18/2016 in [place]  Referring Provider  Dr. Clent Ridges ]      Encounter Date: 04/14/2016  Check In:     Session Check In - 04/14/16 0930      Check-In   Location [facility]   Staff Present Diane Angelina Pih, MS, EP, [facility], Exercise Physiologist;Debra Wynetta Emery, RN, BSN;Wardell Pokorski, BS, EP, Exercise Physiologist   Supervising physician immediately available to respond to emergencies See telemetry face sheet for immediately available MD   Medication changes reported     No   Warm-up and Cool-down Performed as group-led instruction   Resistance Training Performed Yes   VAD Patient? No     Pain Assessment   Currently in Pain? No/denies   Pain Score 0-No pain   Multiple Pain Sites No      Capillary Blood Glucose: No results found for this or any previous visit (from the past 24 hour(s)).   Goals Met:  Independence with exercise equipment Exercise tolerated well No report of cardiac concerns or symptoms Strength training completed today  Goals Unmet:  Not Applicable  Comments: Check out 1030   Dr. Kate Sable is Medical Director for [place] and Pulmonary Rehab.

## 2016-04-19 ENCOUNTER — Encounter (HOSPITAL_COMMUNITY)
Admission: RE | Admit: 2016-04-19 | Discharge: 2016-04-19 | Disposition: A | Discharge status: Home / Self Care | Payer: Medicare Other | Source: Ambulatory Visit | Attending: Cardiovascular Disease | Admitting: Cardiovascular Disease

## 2016-04-19 DIAGNOSIS — Z951 Presence of aortocoronary bypass graft: Secondary | ICD-10-CM

## 2016-04-19 NOTE — Progress Notes (Signed)
Daily Session Note  Patient Details  Name: [name] MRN: [id number] Date of Birth: [birth year] Referring Provider:   Flowsheet Row CARDIAC REHAB PHASE II ORIENTATION from 01/18/2016 in [place]  Referring Provider  Dr. Clent Ridges ]      Encounter Date: 04/19/2016  Check In:     Session Check In - 04/19/16 0930      Check-In   Location [facility]   Staff Present Aundra Dubin, RN, BSN;Shanik[employer] Luther Parody, BS, EP, Exercise Physiologist   Supervising physician immediately available to respond to emergencies See telemetry face sheet for immediately available MD   Medication changes reported     No   Fall or balance concerns reported    No   Warm-up and Cool-down Performed as group-led instruction   Resistance Training Performed Yes   VAD Patient? No     Pain Assessment   Currently in Pain? No/denies   Pain Score 0-No pain   Multiple Pain Sites No      Capillary Blood Glucose: No results found for this or any previous visit (from the past 24 hour(s)).   Goals Met:  Independence with exercise equipment Exercise tolerated well No report of cardiac concerns or symptoms Strength training completed today  Goals Unmet:  Not Applicable  Comments: Check out 1030   Dr. Kate Sable is Medical Director for [place] and Pulmonary Rehab.

## 2016-04-21 ENCOUNTER — Encounter (HOSPITAL_COMMUNITY)
Admission: RE | Admit: 2016-04-21 | Discharge: 2016-04-21 | Disposition: A | Discharge status: Home / Self Care | Payer: Medicare Other | Source: Ambulatory Visit | Attending: Cardiovascular Disease | Admitting: Cardiovascular Disease

## 2016-04-21 DIAGNOSIS — Z951 Presence of aortocoronary bypass graft: Secondary | ICD-10-CM

## 2016-04-21 NOTE — Progress Notes (Signed)
Daily Session Note  Patient Details  Name: OCTAVION MOLLENKOPF MRN: [id number] Date of Birth: [birth year] Referring Provider:   Flowsheet Row CARDIAC REHAB PHASE II ORIENTATION from 01/18/2016 in [place]  Referring Provider  Dr. Clent Ridges ]      Encounter Date: 04/21/2016  Check In:     Session Check In - 04/21/16 0930      Check-In   Location [facility]   Staff Present Aundra Dubin, RN, BSN;Nahum Sherrer Luther Parody, BS, EP, Exercise Physiologist   Supervising physician immediately available to respond to emergencies See telemetry face sheet for immediately available MD   Medication changes reported     No   Fall or balance concerns reported    No   Warm-up and Cool-down Performed as group-led instruction   Resistance Training Performed Yes   VAD Patient? No     Pain Assessment   Currently in Pain? No/denies   Pain Score 0-No pain   Multiple Pain Sites No      Capillary Blood Glucose: No results found for this or any previous visit (from the past 24 hour(s)).   Goals Met:  Independence with exercise equipment Exercise tolerated well No report of cardiac concerns or symptoms Strength training completed today  Goals Unmet:  Not Applicable  Comments: Check out 1030   Dr. Kate Sable is Medical Director for [place] and Pulmonary Rehab.

## 2016-04-24 ENCOUNTER — Encounter (HOSPITAL_COMMUNITY)
Admission: RE | Admit: 2016-04-24 | Discharge: 2016-04-24 | Disposition: A | Discharge status: Home / Self Care | Payer: Medicare Other | Source: Ambulatory Visit | Attending: Cardiovascular Disease | Admitting: Cardiovascular Disease

## 2016-04-24 VITALS — Ht 68.0 in | Wt 159.3 lb

## 2016-04-24 DIAGNOSIS — Z951 Presence of aortocoronary bypass graft: Secondary | ICD-10-CM | POA: Diagnosis not present

## 2016-04-24 NOTE — Progress Notes (Signed)
Daily Session Note  Patient Details  Name: Delmo G Moseley MRN: [id number] Date of Birth: 12/07/1945 Referring Provider:   Flowsheet Row CARDIAC REHAB PHASE II ORIENTATION from 01/18/2016 in [place] CARDIAC REHABILITATION  Referring Provider  Dr. Berry [Dr. Berry ]      Encounter Date: 04/24/2016  Check In:     Session Check In - 04/24/16 0930      Check-In   Location [facility]   Staff Present Diane Coad, MS, EP, [facility], Exercise Physiologist;Debra Johnson, RN, BSN;Joelie Schou, BS, EP, Exercise Physiologist   Supervising physician immediately available to respond to emergencies See telemetry face sheet for immediately available MD   Medication changes reported     No   Fall or balance concerns reported    No   Warm-up and Cool-down Performed as group-led instruction   Resistance Training Performed Yes   VAD Patient? No     Pain Assessment   Currently in Pain? No/denies   Pain Score 0-No pain   Multiple Pain Sites No      Capillary Blood Glucose: No results found for this or any previous visit (from the past 24 hour(s)).   Goals Met:  Independence with exercise equipment Exercise tolerated well No report of cardiac concerns or symptoms Strength training completed today  Goals Unmet:  Not Applicable  Comments: Check out 1030   Dr. Suresh Koneswaran is Medical Director for [place] [facility] and Pulmonary Rehab. 

## 2016-04-25 DIAGNOSIS — D689 Coagulation defect, unspecified: Secondary | ICD-10-CM | POA: Diagnosis not present

## 2016-04-25 DIAGNOSIS — I739 Peripheral vascular disease, unspecified: Secondary | ICD-10-CM | POA: Diagnosis not present

## 2016-04-25 DIAGNOSIS — Z01818 Encounter for other preprocedural examination: Secondary | ICD-10-CM | POA: Diagnosis not present

## 2016-04-25 DIAGNOSIS — Z79899 Other long term (current) drug therapy: Secondary | ICD-10-CM | POA: Diagnosis not present

## 2016-04-26 ENCOUNTER — Encounter (HOSPITAL_COMMUNITY): Payer: Medicare Other

## 2016-04-26 LAB — CBC WITH DIFFERENTIAL/PLATELET
BASOS ABS: 87 {cells}/uL (ref 0–200)
Basophils Relative: 1 %
EOS PCT: 6 %
Eosinophils Absolute: 522 cells/uL — ABNORMAL HIGH (ref 15–500)
HCT: 34.6 % — ABNORMAL LOW (ref 38.5–50.0)
HEMOGLOBIN: 11 g/dL — AB (ref 13.2–17.1)
LYMPHS ABS: 1827 {cells}/uL (ref 850–3900)
LYMPHS PCT: 21 %
MCH: 26.4 pg — AB (ref 27.0–33.0)
MCHC: 31.8 g/dL — AB (ref 32.0–36.0)
MCV: 83.2 fL (ref 80.0–100.0)
MONOS PCT: 11 %
MPV: 8.1 fL (ref 7.5–12.5)
Monocytes Absolute: 957 cells/uL — ABNORMAL HIGH (ref 200–950)
NEUTROS PCT: 61 %
Neutro Abs: 5307 cells/uL (ref 1500–7800)
PLATELETS: 388 10*3/uL (ref 140–400)
RBC: 4.16 MIL/uL — AB (ref 4.20–5.80)
RDW: 16.8 % — AB (ref 11.0–15.0)
WBC: 8.7 10*3/uL (ref 3.8–10.8)

## 2016-04-26 LAB — PROTIME-INR
INR: 1
PROTHROMBIN TIME: 10.5 s (ref 9.0–11.5)

## 2016-04-26 LAB — BASIC METABOLIC PANEL
BUN: 20 mg/dL (ref 7–25)
CALCIUM: 9.1 mg/dL (ref 8.6–10.3)
CO2: 24 mmol/L (ref 20–31)
Chloride: 99 mmol/L (ref 98–110)
Creat: 1.28 mg/dL — ABNORMAL HIGH (ref 0.70–1.25)
GLUCOSE: 124 mg/dL — AB (ref 65–99)
POTASSIUM: 5.5 mmol/L — AB (ref 3.5–5.3)
SODIUM: 136 mmol/L (ref 135–146)

## 2016-04-26 LAB — APTT: APTT: 26 s (ref 22–34)

## 2016-04-27 NOTE — Progress Notes (Signed)
Discharge Summary  Patient Details  Name: [name] MRN: [id number] Date of Birth: 1945/10/20 Referring Provider:   Flowsheet Row CARDIAC REHAB PHASE II ORIENTATION from 01/18/2016 in [place]  Referring Provider  Dr. Gwenlyn Found [Dr. Gwenlyn Found ]       Number of Visits: 68  Reason for Discharge:  Patient reached a stable level of exercise. Patient independent in their exercise.  Smoking History:  History  Smoking Status  . Former Smoker  . Packs/day: 2.00  . Years: 30.00  . Types: Cigarettes  . Quit date: 08/14/2000  Smokeless Tobacco  . Never Used    Diagnosis:  S/P CABG x 5  Status post coronary artery bypass grafts x 5  ADL UCSD:   Initial Exercise Prescription:     Initial Exercise Prescription - 01/19/16 1300      Date of Initial Exercise RX and Referring Provider   Date 01/18/16   Referring Provider Dr. Gwenlyn Found  Dr. Gwenlyn Found      Treadmill   MPH 1.3   Grade 0   Minutes 15   METs 1.9     NuStep   Level 2   Watts 18   Minutes 15   METs 1.9     Arm Ergometer   Level 2   Watts 18   Minutes 15   METs 2.3     Prescription Details   Frequency (times per week) 3   Duration Progress to 45 minutes of aerobic exercise without signs/symptoms of physical distress     Intensity   THRR REST +  30   THRR 40-80% of Max Heartrate [phone]   Ratings of Perceived Exertion 11-13   Perceived Dyspnea 0-4     Progression   Progression Continue to progress workloads to maintain intensity without signs/symptoms of physical distress.     Resistance Training   Training Prescription Yes   Weight 1   Reps 10-12      Discharge Exercise Prescription (Final Exercise Prescription Changes):     Exercise Prescription Changes - 04/25/16 0800      Exercise Review   Progression Yes     Response to Exercise   Blood Pressure (Admit) 128/50   Blood Pressure (Exercise) 144/66   Blood Pressure (Exit) 120/58   Heart Rate (Admit) 73 bpm   Heart  Rate (Exercise) 92 bpm   Heart Rate (Exit) 74 bpm   Rating of Perceived Exertion (Exercise) 10   Duration Progress to 30 minutes of continuous aerobic without signs/symptoms of physical distress   Intensity Rest + 30     Resistance Training   Training Prescription Yes   Weight 4   Reps 10-12     Treadmill   MPH 0   Grade 0   Minutes 0   METs 0     NuStep   Level 3   Watts 49   Minutes 20   METs 3.1     Arm Ergometer   Level 3   Watts 31   Minutes 15   METs 4.2     Home Exercise Plan   Plans to continue exercise at Home   Frequency Add 2 additional days to program exercise sessions.      Functional Capacity:     6 Minute Walk    Row Name 01/19/16 1336 04/25/16 0735       6 Minute Walk   Phase Initial Discharge    Distance 900 feet 1000 feet    Distance %  Change  - 11.11 %    Walk Time 6 minutes 6 minutes    # of Rest Breaks 0 0    MPH 1.7 1.89    METS 2.3 2.45    RPE 15 13    Perceived Dyspnea  9 8    VO2 Peak 9.01 10.99    Symptoms Yes (comment) No    Comments Patient c/o pain at end of the test his legs, more in (R) leg, 8/10.  After a 2 minute rest period, pain was 2/10. Pain was gone before patient left at the end of orientation.   -    Resting HR 102 bpm 79 bpm    Resting BP 92/48 114/42    Max Ex. HR 91 bpm 134 bpm    Max Ex. BP 150/40 142/68    2 Minute Post BP 92/40 118/62       Psychological, QOL, Others - Outcomes: PHQ 2/9: Depression screen [facility] 2/9 04/24/2016 01/18/2016  Decreased Interest 2 0  Down, Depressed, Hopeless 0 0  PHQ - 2 Score 2 0  Altered sleeping 0 0  Tired, decreased energy 0 0  Change in appetite 0 2  Feeling bad or failure about yourself  0 0  Trouble concentrating 0 0  Moving slowly or fidgety/restless 0 0  Suicidal thoughts 0 0  PHQ-9 Score 2 2  Difficult doing work/chores Not difficult at all Not difficult at all    Quality of Life:     Quality of Life - 04/25/16 0742      Quality of Life Scores    Health/Function Pre 30 %   Health/Function Post 26.14 %   Health/Function % Change -12.87 %   Socioeconomic Pre 30 %   Socioeconomic Post 25.71 %   Socioeconomic % Change  -14.3 %   Psych/Spiritual Pre 30 %   Psych/Spiritual Post 29.14 %   Psych/Spiritual % Change -2.87 %   Family Pre 30 %   Family Post 30 %   Family % Change 0 %   GLOBAL Pre 30 %   GLOBAL Post 27.27 %   GLOBAL % Change -9.1 %      Personal Goals: Goals established at orientation with interventions provided to work toward goal.     Personal Goals and Risk Factors at Admission - 01/18/16 1742      Core Components/Risk Factors/Patient Goals on Admission    Weight Management Yes   Intervention Weight Management/Obesity: Establish reasonable short term and long term weight goals.   Admit Weight 158 lb 9.6 oz (71.9 kg)   Goal Weight: Short Term 153 lb 9.6 oz (69.7 kg)   Goal Weight: Long Term 148 lb 9.6 oz (67.4 kg)   Expected Outcomes Short Term: Continue to assess and modify interventions until short term weight is achieved;Long Term: Adherence to nutrition and physical activity/exercise program aimed toward attainment of established weight goal   Sedentary Yes   Intervention Provide advice, education, support and counseling about physical activity/exercise needs.;Develop an individualized exercise prescription for aerobic and resistive training based on initial evaluation findings, risk stratification, comorbidities and participant's personal goals.   Expected Outcomes Achievement of increased cardiorespiratory fitness and enhanced flexibility, muscular endurance and strength shown through measurements of functional capacity and personal statement of participant.   Increase Strength and Stamina Yes   Intervention Provide advice, education, support and counseling about physical activity/exercise needs.;Develop an individualized exercise prescription for aerobic and resistive training based on initial evaluation  findings, risk  stratification, comorbidities and participant's personal goals.   Diabetes Yes   Expected Outcomes Short Term: Participant verbalizes understanding of the signs/symptoms and immediate care of hyper/hypoglycemia, proper foot care and importance of medication, aerobic/resistive exercise and nutrition plan for blood glucose control.;Long Term: Attainment of HbA1C < 7%.   Personal Goal Other Yes   Personal Goal Get stronger, strengthen legs, lose about 5-10lbs.    Intervention Attend regular 3xweek and supplement 2 more days at home   Expected Outcomes Get stronger and lose weight before graduation from program in 12 weeks.        Personal Goals Discharge:     Goals and Risk Factor Review    Row Name 02/07/16 1838 03/09/16 0803 03/29/16 1436 04/24/16 0826       Core Components/Risk Factors/Patient Goals Review   Personal Goals Review Increase Strength and Stamina;Weight Management/Obesity  Strengthen legs Weight Management/Obesity;Increase Strength and Stamina Weight Management/Obesity;Increase Strength and Stamina Weight Management/Obesity;Increase Strength and Stamina;Other  Strengthen legs. Lose 5-15 lbs.     Review Get stronger, strengthen legs and lose 5-10lbs before graduation Patient has lost 2 lbs since starting program. He has increased his strenght and feels he is getting stronger  Patient has maintained his weight after 27 sessions. Patient is progressing well and has increased his stamina and strenght.  Upon graduation, patient's strenght and stamina has improved. He says he is feeling stronger and has gained strenght in his legs. Patient did not meet his goal of losing weight. He gained 4.2 lbs overall. However, his medficts score did improve indicating he is eating a heart healthier diet.     Expected Outcomes Gain strength and stamina, lose weight Patient will continue to try and meet his weight loss goal of 5-10 lbs and will continue to increase his strenght and  stamina.  Patient will continue to work toward meeting the above stated goals.  Patient will continue to exercise and eat a heart healthy diet to maintain his goals.        Nutrition & Weight - Outcomes:     Pre Biometrics - 01/19/16 1343      Pre Biometrics   Height 5\' 8"  (1.727 m)   Weight 158 lb 8.2 oz (71.9 kg)   Waist Circumference 41 inches   Hip Circumference 37.5 inches   Waist to Hip Ratio 1.09 %   BMI (Calculated) 24.2   Triceps Skinfold 11 mm   % Body Fat 25.5 %   Grip Strength 74 kg   Flexibility 16 in   Single Leg Stand 7 seconds         Post Biometrics - 04/25/16 0741       Post  Biometrics   Height 5\' 8"  (1.727 m)   Weight 159 lb 5.2 oz (72.3 kg)   Waist Circumference 40 inches   Hip Circumference 35.5 inches   Waist to Hip Ratio 1.13 %   BMI (Calculated) 24.3   Triceps Skinfold 10 mm   % Body Fat 24.7 %   Grip Strength 77 kg   Flexibility 14.2 in   Single Leg Stand 5 seconds      Nutrition:   Nutrition Discharge:     Nutrition Assessments - 04/27/16 0825      MEDFICTS Scores   Pre Score 39   Post Score 12   Score Difference -27      Education Questionnaire Score:     Knowledge Questionnaire Score - 04/27/16 0824      Knowledge Questionnaire  Score   Pre Score 17/24   Post Score 20/24      Goals reviewed with patient; copy given to patient.

## 2016-05-08 ENCOUNTER — Encounter (HOSPITAL_COMMUNITY)
Admission: RE | Disposition: A | Discharge status: Home / Self Care | Payer: Self-pay | Source: Ambulatory Visit | Attending: Cardiovascular Disease

## 2016-05-08 ENCOUNTER — Ambulatory Visit (HOSPITAL_COMMUNITY)
Admission: RE | Admit: 2016-05-08 | Discharge: 2016-05-08 | Disposition: A | Discharge status: Home / Self Care | Payer: Medicare Other | Source: Ambulatory Visit | Attending: Cardiovascular Disease | Admitting: Cardiovascular Disease

## 2016-05-08 DIAGNOSIS — E1151 Type 2 diabetes mellitus with diabetic peripheral angiopathy without gangrene: Secondary | ICD-10-CM | POA: Diagnosis not present

## 2016-05-08 DIAGNOSIS — Z951 Presence of aortocoronary bypass graft: Secondary | ICD-10-CM | POA: Insufficient documentation

## 2016-05-08 DIAGNOSIS — E785 Hyperlipidemia, unspecified: Secondary | ICD-10-CM | POA: Diagnosis not present

## 2016-05-08 DIAGNOSIS — Z7984 Long term (current) use of oral hypoglycemic drugs: Secondary | ICD-10-CM | POA: Insufficient documentation

## 2016-05-08 DIAGNOSIS — Z7902 Long term (current) use of antithrombotics/antiplatelets: Secondary | ICD-10-CM | POA: Insufficient documentation

## 2016-05-08 DIAGNOSIS — Z87891 Personal history of nicotine dependence: Secondary | ICD-10-CM | POA: Diagnosis not present

## 2016-05-08 DIAGNOSIS — I10 Essential (primary) hypertension: Secondary | ICD-10-CM | POA: Insufficient documentation

## 2016-05-08 DIAGNOSIS — I70213 Atherosclerosis of native arteries of extremities with intermittent claudication, bilateral legs: Secondary | ICD-10-CM | POA: Diagnosis not present

## 2016-05-08 DIAGNOSIS — I2510 Atherosclerotic heart disease of native coronary artery without angina pectoris: Secondary | ICD-10-CM | POA: Diagnosis not present

## 2016-05-08 DIAGNOSIS — I700 Atherosclerosis of aorta: Secondary | ICD-10-CM | POA: Insufficient documentation

## 2016-05-08 DIAGNOSIS — Z955 Presence of coronary angioplasty implant and graft: Secondary | ICD-10-CM | POA: Diagnosis not present

## 2016-05-08 DIAGNOSIS — I739 Peripheral vascular disease, unspecified: Secondary | ICD-10-CM | POA: Diagnosis present

## 2016-05-08 DIAGNOSIS — Z7982 Long term (current) use of aspirin: Secondary | ICD-10-CM | POA: Diagnosis not present

## 2016-05-08 DIAGNOSIS — Z95820 Peripheral vascular angioplasty status with implants and grafts: Secondary | ICD-10-CM | POA: Insufficient documentation

## 2016-05-08 HISTORY — PX: PERIPHERAL VASCULAR CATHETERIZATION: SHX172C

## 2016-05-08 LAB — GLUCOSE, CAPILLARY: Glucose-Capillary: 136 mg/dL — ABNORMAL HIGH (ref 65–99)

## 2016-05-08 SURGERY — LOWER EXTREMITY ANGIOGRAPHY

## 2016-05-08 MED ORDER — IODIXANOL 320 MG/ML IV SOLN
INTRAVENOUS | Status: DC | PRN
  Administered 2016-05-08: 140 mL via INTRA_ARTERIAL

## 2016-05-08 MED ORDER — SODIUM CHLORIDE 0.9 % WEIGHT BASED INFUSION: 1.0000 mL/kg/h | INTRAVENOUS | Status: DC

## 2016-05-08 MED ORDER — HEPARIN SODIUM (PORCINE) 1000 UNIT/ML IJ SOLN
INTRAMUSCULAR | Status: DC | PRN
  Administered 2016-05-08: 1000 [IU] via INTRAVENOUS

## 2016-05-08 MED ORDER — SODIUM CHLORIDE 0.9 % WEIGHT BASED INFUSION
3.0000 mL/kg/h | INTRAVENOUS | Status: DC
  Administered 2016-05-08: 3 mL/kg/h via INTRAVENOUS

## 2016-05-08 MED ORDER — MIDAZOLAM HCL 2 MG/2ML IJ SOLN
INTRAMUSCULAR | Status: AC
  Filled 2016-05-08: qty 2

## 2016-05-08 MED ORDER — ASPIRIN 81 MG PO CHEW: 81.0000 mg | CHEWABLE_TABLET | ORAL | Status: DC

## 2016-05-08 MED ORDER — MIDAZOLAM HCL 2 MG/2ML IJ SOLN
INTRAMUSCULAR | Status: DC | PRN
  Administered 2016-05-08 (×2): 1 mg via INTRAVENOUS

## 2016-05-08 MED ORDER — FENTANYL CITRATE (PF) 100 MCG/2ML IJ SOLN
INTRAMUSCULAR | Status: DC | PRN
  Administered 2016-05-08 (×3): 25 ug via INTRAVENOUS

## 2016-05-08 MED ORDER — ONDANSETRON HCL 4 MG/2ML IJ SOLN: 4.0000 mg | Freq: Four times a day (QID) | INTRAMUSCULAR | Status: DC | PRN

## 2016-05-08 MED ORDER — ACETAMINOPHEN 325 MG PO TABS: 650.0000 mg | ORAL_TABLET | ORAL | Status: DC | PRN

## 2016-05-08 MED ORDER — SODIUM CHLORIDE 0.9% FLUSH
3.0000 mL | INTRAVENOUS | Status: DC | PRN
Start: 2016-05-08 — End: 2016-05-08

## 2016-05-08 MED ORDER — FENTANYL CITRATE (PF) 100 MCG/2ML IJ SOLN
INTRAMUSCULAR | Status: AC
  Filled 2016-05-08: qty 2

## 2016-05-08 MED ORDER — SODIUM CHLORIDE 0.9 % IV SOLN: INTRAVENOUS | Status: AC

## 2016-05-08 MED ORDER — LIDOCAINE HCL (PF) 1 % IJ SOLN
INTRAMUSCULAR | Status: AC
  Filled 2016-05-08: qty 30

## 2016-05-08 MED ORDER — HEPARIN (PORCINE) IN NACL 2-0.9 UNIT/ML-% IJ SOLN
INTRAMUSCULAR | Status: AC
  Filled 2016-05-08: qty 1000

## 2016-05-08 MED ORDER — MORPHINE SULFATE (PF) 2 MG/ML IV SOLN: 2.0000 mg | INTRAVENOUS | Status: DC | PRN

## 2016-05-08 MED ORDER — LIDOCAINE HCL (PF) 1 % IJ SOLN
INTRAMUSCULAR | Status: DC | PRN
  Administered 2016-05-08: 20 mL

## 2016-05-08 SURGICAL SUPPLY — 11 items
CATH ANGIO 5F PIGTAIL 65CM (CATHETERS) ×1 IMPLANT
KIT MICROINTRODUCER STIFF 5F (SHEATH) ×1 IMPLANT
KIT PV (KITS) ×2 IMPLANT
SHEATH PINNACLE 5F 10CM (SHEATH) ×1 IMPLANT
STOPCOCK MORSE 400PSI 3WAY (MISCELLANEOUS) ×1 IMPLANT
SYRINGE MEDRAD AVANTA MACH 7 (SYRINGE) ×1 IMPLANT
TRANSDUCER W/STOPCOCK (MISCELLANEOUS) ×2 IMPLANT
TRAY PV CATH (CUSTOM PROCEDURE TRAY) ×2 IMPLANT
TUBING CIL FLEX 10 FLL-RA (TUBING) ×1 IMPLANT
WIRE HITORQ VERSACORE ST 145CM (WIRE) ×1 IMPLANT
WIRE TORQFLEX AUST .018X40CM (WIRE) ×1 IMPLANT

## 2016-05-08 NOTE — Progress Notes (Addendum)
Site area:RFA  Site Prior to Removal:  Level 0 Pressure Applied For:27min Manual:yes    Patient Status During Pull:  stable Post Pull Site:  Level Post Pull Instructions Given:  yes Post Pull Pulses Present: doppler Dressing Applied:  tegaderm Bedrest begins @ [id number] till 1340 Comments:

## 2016-05-08 NOTE — Discharge Instructions (Signed)
Angiogram, Care After °Refer to this sheet in the next few weeks. These instructions provide you with information about caring for yourself after your procedure. Your health care provider may also give you more specific instructions. Your treatment has been planned according to current medical practices, but problems sometimes occur. Call your health care provider if you have any problems or questions after your procedure. °WHAT TO EXPECT AFTER THE PROCEDURE °After your procedure, it is typical to have the following: °· Bruising at the catheter insertion site that usually fades within 1-2 weeks. °· Blood collecting in the tissue (hematoma) that may be painful to the touch. It should usually decrease in size and tenderness within 1-2 weeks. °HOME CARE INSTRUCTIONS °· Take medicines only as directed by your health care provider. °· You may shower 24-48 hours after the procedure or as directed by your health care provider. Remove the bandage (dressing) and gently wash the site with plain soap and water. Pat the area dry with a clean towel. Do not rub the site, because this may cause bleeding. °· Do not take baths, swim, or use a hot tub until your health care provider approves. °· Check your insertion site every day for redness, swelling, or drainage. °· Do not apply powder or lotion to the site. °· Do not lift over 10 lb (4.5 kg) for 5 days after your procedure or as directed by your health care provider. °· Ask your health care provider when it is okay to: °¨ Return to work or school. °¨ Resume usual physical activities or sports. °¨ Resume sexual activity. °· Do not drive home if you are discharged the same day as the procedure. Have someone else drive you. °· You may drive 24 hours after the procedure unless otherwise instructed by your health care provider. °· Do not operate machinery or power tools for 24 hours after the procedure or as directed by your health care provider. °· If your procedure was done as an  outpatient procedure, which means that you went home the same day as your procedure, a responsible adult should be with you for the first 24 hours after you arrive home. °· Keep all follow-up visits as directed by your health care provider. This is important. °SEEK MEDICAL CARE IF: °· You have a fever. °· You have chills. °· You have increased bleeding from the catheter insertion site. Hold pressure on the site. °SEEK IMMEDIATE MEDICAL CARE IF: °· You have unusual pain at the catheter insertion site. °· You have redness, warmth, or swelling at the catheter insertion site. °· You have drainage (other than a small amount of blood on the dressing) from the catheter insertion site. °· The catheter insertion site is bleeding, and the bleeding does not stop after 30 minutes of holding steady pressure on the site. °· The area near or just beyond the catheter insertion site becomes pale, cool, tingly, or numb. °  °This information is not intended to replace advice given to you by your health care provider. Make sure you discuss any questions you have with your health care provider. °  °Document Released: 02/16/2005 Document Revised: 08/21/2014 Document Reviewed: 01/01/2013 °Elsevier Interactive Patient Education ©2016 [employer] ° °

## 2016-05-08 NOTE — H&P (Signed)
05/08/16                                          ADMISSION  H & P June 03, 1946  [id number]  Primary Physician Pcp Not In System Primary Cardiologist: Anthony Harp MD Renae Gloss  HPI:  [name] is a delightful 70 year old thin appearing married Caucasian male father of 2 children who I took care of for years and last saw in the office 02/08/16. Accompanied by his wife today. He has a history of CAD status post circumflex stenting by myself at 2003. He also has a history of peripheral vascular disease status posts grafting of his lower extremities by Dr. Amedeo Dunn in the 90s. I performed stenting of his right lower extremity multiple times. He stopped smoking remotely. Kilograms include treated hypertension, hyperlipidemia and type 2 diabetes. He was admitted in February with unstable angina and underwent cardiac catheterization by Dr. Martinique 10/11/15 revealing three-vessel disease. He ultimately underwent coronary artery bypass grafting on 10/18/15 by Dr. Roxan Hockey is a LIMA to his LAD, vein to diagonal branch, obtuse marginal branches 1 and 2 and the distal RCA. His postop course was unremarkable. 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 His right ABI was 0.6 by the left was 0.88. He is more symptomatically the right and does have lifestyle limiting claudication.          Current Outpatient Prescriptions  Medication Sig Dispense Refill  . aspirin 81 MG tablet Take 81 mg by mouth daily.    . Cholecalciferol (VITAMIN D) 2000 UNITS tablet Take 2,000 Units by mouth daily.    . clopidogrel (PLAVIX) 75 MG tablet Take 75 mg by mouth daily with breakfast.    . ferrous sulfate 325 (65 FE) MG tablet Take 325-650 mg by mouth 2 (two) times daily with a meal. 650mg  in the morning and 325mg  in the evening    . gabapentin (NEURONTIN) 300 MG  capsule Take 300 mg by mouth 3 (three) times daily.     [name] glipiZIDE (GLUCOTROL) 5 MG tablet Take 10 mg by mouth 2 (two) times daily before a meal.     . levofloxacin (LEVAQUIN) 500 MG tablet Take 1 tablet (500 mg total) by mouth daily. 5 tablet 0  . lisinopril (PRINIVIL,ZESTRIL) 5 MG tablet     . metFORMIN (GLUCOPHAGE) 1000 MG tablet Take 1,000 mg by mouth 2 (two) times daily with a meal.    . metoprolol (LOPRESSOR) 50 MG tablet Take 1 tablet (50 mg total) by mouth 2 (two) times daily. 180 tablet 3  . oxyCODONE (OXY IR/ROXICODONE) 5 MG immediate release tablet Take 1-2 tablets (5-10 mg total) by mouth every 4 (four) hours as needed for severe pain. 30 tablet 0  . pantoprazole (PROTONIX) 40 MG tablet Take 40 mg by mouth daily.    . simvastatin (ZOCOR) 40 MG tablet Take 20 mg by mouth at bedtime.      No current facility-administered medications for this visit.     No Known Allergies  Social History        Social History  . Marital status: Married    Spouse name: N/A  . Number of children: N/A  . Years of education: N/A      Occupational History  . Not  on file.        Social History Main Topics  . Smoking status: Former Smoker    Packs/day: 2.00    Years: 30.00    Types: Cigarettes    Quit date: 08/14/2000  . Smokeless tobacco: Never Used  . Alcohol use No  . Drug use: No  . Sexual activity: Not on file       Other Topics Concern  . Not on file      Social History Narrative  . No narrative on file     Review of Systems: General: negative for chills, fever, night sweats or weight changes.  Cardiovascular: negative for chest pain, dyspnea on exertion, edema, orthopnea, palpitations, paroxysmal nocturnal dyspnea or shortness of breath Dermatological: negative for rash Respiratory: negative for cough or wheezing Urologic: negative for hematuria Abdominal: negative for nausea, vomiting, diarrhea, bright red blood per rectum, melena, or  hematemesis Neurologic: negative for visual changes, syncope, or dizziness All other systems reviewed and are otherwise negative except as noted above.    Blood pressure (!) 102/42, pulse 86, height 5\' 8"  (1.727 m), weight 160 lb (72.6 kg).  General appearance: alert and no distress Neck: no adenopathy, no carotid bruit, no JVD, supple, symmetrical, trachea midline and thyroid not enlarged, symmetric, no tenderness/mass/nodules Lungs: clear to auscultation bilaterally Heart: regular rate and rhythm, S1, S2 normal, no murmur, click, rub or gallop Extremities: Absent pulse on the left with a soft bruit, 1+ pulse on the right with a laboratory  EKG not performed today  ASSESSMENT AND PLAN:   PVD (peripheral vascular disease) ([place]) [name] returns for follow-up of his Doppler studies. He has had multiple interventions on his lower extremities by myself as well as left femoropopliteal bypass grafting by Dr. Amedeo Dunn in the 90s. He complains of severe lifestyle limiting claudication. 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 His right ABI was 0.69 and left was 0.88. We decided to proceed with outpatient peripheral angiography sometime in the next several weeks.      Anthony Harp MD [place], [facility] 05/08/16

## 2016-05-08 NOTE — Interval H&P Note (Signed)
History and Physical Interval Note:  05/08/2016 8:00 AM  [name]  has presented today for surgery, with the diagnosis of pad  The various methods of treatment have been discussed with the patient and family. After consideration of risks, benefits and other options for treatment, the patient has consented to  Procedure(s): Lower Extremity Angiography (N/A) as a surgical intervention .  The patient's history has been reviewed, patient examined, no change in status, stable for surgery.  I have reviewed the patient's chart and labs.  Questions were answered to the patient's satisfaction.     Quay Burow

## 2016-05-09 ENCOUNTER — Encounter (HOSPITAL_COMMUNITY): Payer: Self-pay | Admitting: Cardiovascular Disease

## 2016-05-09 MED FILL — Heparin Sodium (Porcine) 2 Unit/ML in Sodium Chloride 0.9%: INTRAMUSCULAR | Qty: 1000 | Status: AC

## 2016-05-30 ENCOUNTER — Telehealth: Payer: Self-pay | Admitting: Cardiovascular Disease

## 2016-05-30 NOTE — Telephone Encounter (Signed)
Called [name], ok per DPR, and rescheduled appt for 06/07/16 with Dr Gwenlyn Found.

## 2016-05-30 NOTE — Telephone Encounter (Signed)
LMTCB ok to schedule Dr Gwenlyn Found appt today @1 :45 per jennifer

## 2016-05-30 NOTE — Telephone Encounter (Signed)
Spoke with pt states that he is unable to come in today. Tried to schedule next available is not until 11-29 can we fit him in sooner? Please advise

## 2016-05-30 NOTE — Telephone Encounter (Signed)
Spoke with daughter she states that reason that she is trying to get pt in sooner than next available is because pt needs vasc surgery (ASAP per daughter) referral and clearance, please advise

## 2016-05-30 NOTE — Telephone Encounter (Signed)
New Message  Pts daughter voiced needing an appt with MD-Berry regarding post pre-op appt.  Pts daughter voiced waiting for someone to call to schedule an appt but no has called as of yet.  Offer 06/30/2016 pt declined stating it has to be within two weeks, offer PA/APP pt declined also.  Please f/u with pt for scheduling.

## 2016-06-07 ENCOUNTER — Ambulatory Visit (INDEPENDENT_AMBULATORY_CARE_PROVIDER_SITE_OTHER): Payer: Medicare Other | Admitting: Cardiovascular Disease

## 2016-06-07 ENCOUNTER — Ambulatory Visit: Payer: Medicare Other | Admitting: Cardiovascular Disease

## 2016-06-07 ENCOUNTER — Encounter: Payer: Self-pay | Admitting: Cardiovascular Disease

## 2016-06-07 VITALS — BP 112/56 | HR 67 | Ht 67.0 in | Wt 146.0 lb

## 2016-06-07 DIAGNOSIS — I739 Peripheral vascular disease, unspecified: Secondary | ICD-10-CM

## 2016-06-07 NOTE — Patient Instructions (Signed)
Medication Instructions:  Your physician recommends that you continue on your current medications as directed. Please refer to the Current Medication list given to you today.  Follow-Up: You have been referred to Dr. Harold Barban for evaluation.  Your physician recommends that you schedule a follow-up appointment in: 3 months with Dr. Gwenlyn Found.  If you need a refill on your cardiac medications before your next appointment, please call your pharmacy.

## 2016-06-07 NOTE — Assessment & Plan Note (Signed)
[name] returns today for follow-up of his lower extremity angiogram which I performed on 05/08/16 for progressive claudication. He has had remote right to left femorofemoral crossover graft in by Dr. Amedeo Plenty in the 90s as well as left femoropopliteal bypass grafting. I stented his right external iliac and SFA multiple times. His Dopplers revealed a high-frequency signal in his right external iliac artery and occluded left iliac. Angiography confirmed an occluded iliac, patent right-to-left femorofemoral crossover graft, patent right external iliac artery stent, bilateral SFA occlusion and high-grade calcified 95% lesion in the right common femoral artery just above the takeoff of the femorofemoral anastomosis. I believe this is high risk for percutaneous revascularization. His jeopardizing circulation to both legs. I suspect he would benefit from right common femoral endarterectomy and patch angioplasty which is somewhat more high risk given the proximity to his previously  placed graft. I'm referring him to Dr. Trula Slade for surgical consideration.

## 2016-06-07 NOTE — Progress Notes (Signed)
06/07/2016 [name]   [birth year]  UG:[id number]  Primary Physician Pcp Not In System Primary Cardiologist: Lorretta Harp MD Renae Gloss  HPI:  [name] is a delightful 70 year old thin appearing married Caucasian male father of 2 children who I took care of for years and last saw in the office 04/04/16. Accompanied by his wife today. He has a history of CAD status post circumflex stenting by myself in 2003. He also has a history of peripheral vascular disease status post grafting of his lower extremities by Dr. Amedeo Plenty in the 90s. He had right to left femorofemoral crossover grafting. I performed stenting of his right lower extremity multiple times. He stopped smoking remotely. His cardiovascular risk factor profile includes treated hypertension, hyperlipidemia and type 2 diabetes. He was admitted in February with unstable angina and underwent cardiac catheterization by Dr. Martinique 10/11/15 revealing three-vessel disease. He ultimately underwent coronary artery bypass grafting on 10/18/15 by Dr. Roxan Hockey is a LIMA to his LAD, vein to diagonal branch, obtuse marginal branches 1 and 2 and the distal RCA. His postop course was unremarkable. He recently had Doppler studies revealing an occluded left iliac with high-grade right external iliac artery stenosis, occluded SFAs bilaterally with occluded left femoropopliteal bypass graft and a patent right to left femorofemoral crossover graft.. His right ABI was 0.6 by the left was 0.88. He is more symptomatic on the right and does have lifestyle limiting claudication. He underwent peripheral angiography by myself 05/08/16 revealing a patent right-to-left femorofemoral crossover graft, occluded left common iliac artery, occluded SFAs bilaterally, patent right external iliac artery stent with a 95% calcified lesion in the right common femoral artery just proximal to the anastomosis of his temporal crossover graft. I felt this was a high risk for  percutaneous revascularization and suggested that he undergo right common femoral endarterectomy and patch angioplasty.   Current Outpatient Prescriptions  Medication Sig Dispense Refill  . aspirin 81 MG tablet Take 81 mg by mouth daily.    . Cholecalciferol (VITAMIN D) 2000 UNITS tablet Take 2,000 Units by mouth daily.    . clopidogrel (PLAVIX) 75 MG tablet Take 75 mg by mouth daily with breakfast.    . ferrous sulfate 325 (65 FE) MG tablet Take 325 mg by mouth 2 (two) times daily with a meal. 650mg  in the morning and 325mg  in the evening     . gabapentin (NEURONTIN) 300 MG capsule Take 300 mg by mouth 3 (three) times daily.     [name] glipiZIDE (GLUCOTROL) 5 MG tablet Take 10 mg by mouth 2 (two) times daily before a meal.     . levofloxacin (LEVAQUIN) 500 MG tablet Take 1 tablet (500 mg total) by mouth daily. 5 tablet 0  . lisinopril (PRINIVIL,ZESTRIL) 5 MG tablet Take 5 mg by mouth daily.     . metFORMIN (GLUCOPHAGE) 1000 MG tablet Take 2,000 mg by mouth 2 (two) times daily with a meal.     . metoprolol (LOPRESSOR) 50 MG tablet Take 1 tablet (50 mg total) by mouth 2 (two) times daily. 180 tablet 3  . oxyCODONE (OXY IR/ROXICODONE) 5 MG immediate release tablet Take 1-2 tablets (5-10 mg total) by mouth every 4 (four) hours as needed for severe pain. 30 tablet 0  . pantoprazole (PROTONIX) 40 MG tablet Take 40 mg by mouth daily.    . simvastatin (ZOCOR) 40 MG tablet Take 20 mg by mouth at bedtime.      No current facility-administered  medications for this visit.     No Known Allergies  Social History   Social History  . Marital status: Married    Spouse name: N/A  . Number of children: N/A  . Years of education: N/A   Occupational History  . Not on file.   Social History Main Topics  . Smoking status: Former Smoker    Packs/day: 2.00    Years: 30.00    Types: Cigarettes    Quit date: 08/14/2000  . Smokeless tobacco: Never Used  . Alcohol use No  . Drug use: No  . Sexual activity:  Not on file   Other Topics Concern  . Not on file   Social History Narrative  . No narrative on file     Review of Systems: General: negative for chills, fever, night sweats or weight changes.  Cardiovascular: negative for chest pain, dyspnea on exertion, edema, orthopnea, palpitations, paroxysmal nocturnal dyspnea or shortness of breath Dermatological: negative for rash Respiratory: negative for cough or wheezing Urologic: negative for hematuria Abdominal: negative for nausea, vomiting, diarrhea, bright red blood per rectum, melena, or hematemesis Neurologic: negative for visual changes, syncope, or dizziness All other systems reviewed and are otherwise negative except as noted above.    Blood pressure (!) 112/56, pulse 67, height 5\' 7"  (1.702 m), weight 146 lb (66.2 kg).  General appearance: alert and no distress Neck: no adenopathy, no carotid bruit, no JVD, supple, symmetrical, trachea midline and thyroid not enlarged, symmetric, no tenderness/mass/nodules Lungs: clear to auscultation bilaterally Heart: regular rate and rhythm, S1, S2 normal, no murmur, click, rub or gallop Extremities: extremities normal, atraumatic, no cyanosis or edema  EKG not performed today  ASSESSMENT AND PLAN:   PVD (peripheral vascular disease) ([place]) [name] returns today for follow-up of his lower extremity angiogram which I performed on 05/08/16 for progressive claudication. He has had remote right to left femorofemoral crossover graft in by Dr. Amedeo Plenty in the 90s as well as left femoropopliteal bypass grafting. I stented his right external iliac and SFA multiple times. His Dopplers revealed a high-frequency signal in his right external iliac artery and occluded left iliac. Angiography confirmed an occluded iliac, patent right-to-left femorofemoral crossover graft, patent right external iliac artery stent, bilateral SFA occlusion and high-grade calcified 95% lesion in the right common femoral artery just  above the takeoff of the femorofemoral anastomosis. I believe this is high risk for percutaneous revascularization. His jeopardizing circulation to both legs. I suspect he would benefit from right common femoral endarterectomy and patch angioplasty which is somewhat more high risk given the proximity to his previously  placed graft. I'm referring him to Dr. Trula Slade for surgical consideration.      Lorretta Harp MD FACP,FACC,FAHA, [facility] 06/07/2016 10:51 AM

## 2016-07-12 ENCOUNTER — Encounter: Payer: Self-pay | Admitting: Surgery

## 2016-07-17 ENCOUNTER — Encounter: Payer: Self-pay | Admitting: Surgery

## 2016-07-17 ENCOUNTER — Ambulatory Visit (INDEPENDENT_AMBULATORY_CARE_PROVIDER_SITE_OTHER): Payer: Medicare Other | Admitting: Surgery

## 2016-07-17 VITALS — BP 102/68 | HR 79 | Temp 97.1°F | Ht 67.0 in | Wt 165.9 lb

## 2016-07-17 DIAGNOSIS — I739 Peripheral vascular disease, unspecified: Secondary | ICD-10-CM

## 2016-07-17 DIAGNOSIS — I70213 Atherosclerosis of native arteries of extremities with intermittent claudication, bilateral legs: Secondary | ICD-10-CM | POA: Diagnosis not present

## 2016-07-17 NOTE — Progress Notes (Signed)
History of Present Illness:  Patient is a 70 y.o. year old male who presents for evaluation of claudication in bilateral LE progressing over the past year.   He had surgery with Dr. Amedeo Plenty in 2008-2010 right to left fem-fem by pass.  He has also had right external iliac stent, right SFA stent now occluded. Doppler studies revealing an occluded left iliac with high-grade right external iliac artery stenosis, occluded SFAs bilaterally with occluded left femoropopliteal bypass graft and a patent right to left femorofemoral crossover graft.. His right ABI was 0.6 by the left was 0.88. He is more symptomatic on the right and does have lifestyle limiting claudication. He under  went angiography of the aorta with bilateral runoff by Dr. Gwenlyn Found which showed:  a patent right-to-left femorofemoral crossover graft, occluded left common iliac artery, occluded SFAs bilaterally, patent right external iliac artery stent with a 95% calcified lesion in the right common femoral artery just proximal to the anastomosis of his temporal crossover graft.  There is not rest pain.  There is no history of ulcerations on the feet.  Atherosclerotic risk factors and other medical problems include DM, HTN managed with Metoprolol and Lisinopril, hypercholesterolemia managed with Zocor.  He takes Plavix and a daily 81 mg Asprin.  He has had 5 vessel by pass coronary artery surgery March 2017.  Past Medical History:  Diagnosis Date  . CAD S/P percutaneous coronary angioplasty 1998   PCI TO CX  . Diabetes mellitus without complication ([place])   . GERD (gastroesophageal reflux disease)   . 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 Hypercholesteremia   . Hypertension   . Neuropathy ([place])   . Peripheral vascular disease ([place])    s/p R-L FEM-FEM BYPASS    Past Surgical History:  Procedure Laterality Date  . CARDIAC CATHETERIZATION  2003   with stent  . CARDIAC CATHETERIZATION N/A 10/11/2015   Procedure: Left Heart Cath and Coronary  Angiography;  Surgeon: Peter M Martinique, MD;  Location: [facility];  Service: Cardiovascular;  Laterality: N/A;  . COLONOSCOPY N/A 09/22/2013   Procedure: COLONOSCOPY;  Surgeon: Daneil Dolin, MD;  [facility];  Service: Endoscopy;  Laterality: N/A;  9:30 AM  . CORONARY ARTERY BYPASS GRAFT N/A 10/18/2015   Procedure: CORONARY ARTERY BYPASS GRAFTING (CABG) x  five, using left internal mammary artery and right leg greater saphenous vein harvested endoscopically;  Surgeon: [name], MD;  Location: [facility];  Service: Open Heart Surgery;  Laterality: N/A;  . FEMORAL-POPLITEAL BYPASS GRAFT    . PERIPHERAL VASCULAR CATHETERIZATION N/A 05/08/2016   Procedure: Lower Extremity Angiography;  Surgeon: Lorretta Harp, MD;  Location: [facility];  Service: Cardiovascular;  Laterality: N/A;  . TEE WITHOUT CARDIOVERSION N/A 10/18/2015   Procedure: TRANSESOPHAGEAL ECHOCARDIOGRAM (TEE);  Surgeon: [name], MD;  Location: [facility];  Service: Open Heart Surgery;  Laterality: N/A;    ROS:   General:  No weight loss, Fever, chills  HEENT: No recent headaches, no nasal bleeding, no visual changes, no sore throat  Neurologic: No dizziness, blackouts, seizures. No recent symptoms of stroke or mini- stroke. No recent episodes of slurred speech, or temporary blindness.  Cardiac: No recent episodes of chest pain/pressure, no shortness of breath at rest.  No shortness of breath with exertion.  Denies history of atrial fibrillation or irregular heartbeat  Vascular: No history of rest pain in feet.  No history of claudication.  No history of non-healing ulcer, No history of DVT   Pulmonary: No home oxygen, no productive cough, no hemoptysis,  No asthma or wheezing  Musculoskeletal:  [ ]  Arthritis, [ ]  Low back pain,  [ ]  Joint pain  Hematologic:No history of hypercoagulable state.  No history of easy bleeding.  No history of anemia  Gastrointestinal: No hematochezia or melena,   No gastroesophageal reflux, no trouble swallowing  Urinary: [ ]  chronic Kidney disease, [ ]  on HD - [ ]  MWF or [ ]  TTHS, [ ]  Burning with urination, [ ]  Frequent urination, [ ]  Difficulty urinating;   Skin: No rashes  Psychological: No history of anxiety,  No history of depression  Social History Social History  Substance Use Topics  . Smoking status: Former Smoker    Packs/day: 2.00    Years: 30.00    Types: Cigarettes    Quit date: 08/14/2000  . Smokeless tobacco: Never Used  . Alcohol use No    Family History Family History  Problem Relation Age of Onset  . Heart attack Mother   . Stroke Mother   . Colon cancer Neg Hx     Allergies  No Known Allergies   Current Outpatient Prescriptions  Medication Sig Dispense Refill  . aspirin 81 MG tablet Take 81 mg by mouth daily.    . Cholecalciferol (VITAMIN D) 2000 UNITS tablet Take 2,000 Units by mouth daily.    . clopidogrel (PLAVIX) 75 MG tablet Take 75 mg by mouth daily with breakfast.    . ferrous sulfate 325 (65 FE) MG tablet Take 325 mg by mouth 2 (two) times daily with a meal. 650mg  in the morning and 325mg  in the evening     . gabapentin (NEURONTIN) 300 MG capsule Take 300 mg by mouth 3 (three) times daily.     [name] glipiZIDE (GLUCOTROL) 5 MG tablet Take 10 mg by mouth 2 (two) times daily before a meal.     . lisinopril (PRINIVIL,ZESTRIL) 5 MG tablet Take 5 mg by mouth daily.     . metFORMIN (GLUCOPHAGE) 1000 MG tablet Take 2,000 mg by mouth 2 (two) times daily with a meal.     . metoprolol (LOPRESSOR) 50 MG tablet Take 1 tablet (50 mg total) by mouth 2 (two) times daily. 180 tablet 3  . pantoprazole (PROTONIX) 40 MG tablet Take 40 mg by mouth daily.    . simvastatin (ZOCOR) 40 MG tablet Take 20 mg by mouth at bedtime.     [name] levofloxacin (LEVAQUIN) 500 MG tablet Take 1 tablet (500 mg total) by mouth daily. 5 tablet 0  . oxyCODONE (OXY IR/ROXICODONE) 5 MG immediate release tablet Take 1-2 tablets (5-10 mg total) by mouth  every 4 (four) hours as needed for severe pain. (Patient not taking: Reported on 07/17/2016) 30 tablet 0   No current facility-administered medications for this visit.     Physical Examination  Vitals:   07/17/16 1254  BP: 102/68  Pulse: 79  Temp: 97.1 F (36.2 C)  TempSrc: Oral  SpO2: 98%  Weight: 165 lb 14.4 oz (75.3 kg)  Height: 5\' 7"  (1.702 m)    Body mass index is 25.98 kg/m.  General:  Alert and oriented, no acute distress HEENT: Normal Neck: No bruit or JVD Pulmonary: Clear to auscultation bilaterally Cardiac: Regular Rate and Rhythm without murmur Abdomen: Soft, non-tender, non-distended, no mass, no scars Skin: No rash Extremity Pulses:  2+ radial, brachial, femoral, dorsalis pedis, posterior tibial pulses bilaterally Musculoskeletal:  No deformity or edema  Neurologic: Upper and lower extremity motor 5/5 and symmetric  DATA:   Doppler studies:  revealing an occluded left iliac with high-grade right external iliac artery stenosis, occluded SFAs bilaterally with occluded left femoropopliteal bypass graft and a patent right to left femorofemoral crossover graft.. His right ABI was 0.6 by the left was 0.88.   Angiogram: patent right-to-left femorofemoral crossover graft, occluded left common iliac artery, occluded SFAs bilaterally, patent right external iliac artery stent with a 95% calcified lesion in the right common femoral artery just proximal to the anastomosis of his temporal crossover graft.    ASSESSMENT:  PAD with symptoms of claudication bilaterally. Stenosis of the right femoral artery is causing decreased blood flow to bilateral LE.    PLAN:   Dr. Trula Slade discussed surgical options with him and his wife and the plan is for right femoral endarterectomy with right to left fem-fem by pass revision.  Wed. Dec. 27th 2017.   Theda Sers, EMMA MAUREEN PA-C [facility]    I agree with the above.  I have seen and evaluated  the patient.  He has a history of a right to left femoral-femoral bypass graft by Dr. Amedeo Plenty in the remote past.  He has also undergone left femoral popliteal bypass grafting and stenting to his right superficial femoral artery, both of which have occluded.  He has had progressive symptoms of claudication and recently underwent angiography by Dr. Alvester Chou which revealed significant right common femoral and distal external iliac stenosis, near the arterial anastomosis of the femorofemoral bypass graft.  Percutaneous intervention was not recommended and he was referred to surgery.  I have discussed with the patient proceeding with a right femoral endarterectomy and patch angioplasty.  I will plan on removing the femoral-femoral graft, then doing the femoral endarterectomy, followed by patch angioplasty and then reimplanting the femoral-femoral bypass graft.  I discussed the risks of the operation with the patient and he is okay to proceed.  His Plavix will be discontinued.  His operations been scheduled for Wednesday, December 27.  [name]

## 2016-07-18 ENCOUNTER — Other Ambulatory Visit: Payer: Self-pay

## 2016-07-31 NOTE — Pre-Procedure Instructions (Addendum)
[name]  07/31/2016      [facility] - [place], [place] - [id number] [place] [address] Phone: [phone] Fax: [phone]    Your procedure is scheduled on Wednesday December 27.  Report to [facility] Admitting at 9:15 A.M.  Call this number if you have problems the morning of surgery:  [phone]   Remember:  Do not eat food or drink liquids after midnight.  Take these medicines the morning of surgery with A SIP OF WATER: gabapentin (neurontin), metoprolol (lopressor), protonix (pantoprazole)  Follow Dr. Stephens Shire instructions on whether to stop Plavix and Aspirin  7 days prior to surgery STOP taking any Aleve, Naproxen, Ibuprofen, Motrin, Advil, Goody's, BC's, all herbal medications, fish oil, and all vitamins   WHAT DO I DO ABOUT MY DIABETES MEDICATION?   [name] Do not take oral diabetes medicines (pills) the morning of surgery. DO NOT TAKE metformin (glucophage) or glipizide (glucotrol) the day of surgery. The day before surgery, only take morning and/or lunch doses of glipizide (glucotrol)     How to Manage Your Diabetes Before and After Surgery  Why is it important to control my blood sugar before and after surgery? . Improving blood sugar levels before and after surgery helps healing and can limit problems. . A way of improving blood sugar control is eating a healthy diet by: o  Eating less sugar and carbohydrates o  Increasing activity/exercise o  Talking with your doctor about reaching your blood sugar goals . High blood sugars (greater than 180 mg/dL) can raise your risk of infections and slow your recovery, so you will need to focus on controlling your diabetes during the weeks before surgery. . Make sure that the doctor who takes care of your diabetes knows about your planned surgery including the date and location.  How do I manage my blood sugar before surgery? . Check your blood sugar at least 4  times a day, starting 2 days before surgery, to make sure that the level is not too high or low. o Check your blood sugar the morning of your surgery when you wake up and every 2 hours until you get to the Short Stay unit. . If your blood sugar is less than 70 mg/dL, you will need to treat for low blood sugar: o Do not take insulin. o Treat a low blood sugar (less than 70 mg/dL) with  cup of clear juice (cranberry or apple), 4 glucose tablets, OR glucose gel. o Recheck blood sugar in 15 minutes after treatment (to make sure it is greater than 70 mg/dL). If your blood sugar is not greater than 70 mg/dL on recheck, call [phone] for further instructions. . Report your blood sugar to the short stay nurse when you get to Short Stay.  . If you are admitted to the hospital after surgery: o Your blood sugar will be checked by the staff and you will probably be given insulin after surgery (instead of oral diabetes medicines) to make sure you have good blood sugar levels. o The goal for blood sugar control after surgery is 80-180 mg/dL.                 Do not wear jewelry, make-up or nail polish.  Do not wear lotions, powders, or perfumes, or deoderant.  Do not shave 48 hours prior to surgery.  Men may shave face and neck.  Do not bring valuables to the hospital.  [facility] is not responsible for any belongings or valuables.  Contacts, dentures or bridgework may not be worn into surgery.  Leave your suitcase in the car.  After surgery it may be brought to your room.  For patients admitted to the hospital, discharge time will be determined by your treatment team.  Patients discharged the day of surgery will not be allowed to drive home.    Special instructions:    [facility]- Preparing For Surgery  Before surgery, you can play an important role. Because skin is not sterile, your skin needs to be as free of germs as possible. You can reduce the number of germs on your skin by  washing with CHG (chlorahexidine gluconate) Soap before surgery.  CHG is an antiseptic cleaner which kills germs and bonds with the skin to continue killing germs even after washing.  Please do not use if you have an allergy to CHG or antibacterial soaps. If your skin becomes reddened/irritated stop using the CHG.  Do not shave (including legs and underarms) for at least 48 hours prior to first CHG shower. It is OK to shave your face.  Please follow these instructions carefully.   1. Shower the NIGHT BEFORE SURGERY and the MORNING OF SURGERY with CHG.   2. If you chose to wash your hair, wash your hair first as usual with your normal shampoo.  3. After you shampoo, rinse your hair and body thoroughly to remove the shampoo.  4. Use CHG as you would any other liquid soap. You can apply CHG directly to the skin and wash gently with a scrungie or a clean washcloth.   5. Apply the CHG Soap to your body ONLY FROM THE NECK DOWN.  Do not use on open wounds or open sores. Avoid contact with your eyes, ears, mouth and genitals (private parts). Wash genitals (private parts) with your normal soap.  6. Wash thoroughly, paying special attention to the area where your surgery will be performed.  7. Thoroughly rinse your body with warm water from the neck down.  8. DO NOT shower/wash with your normal soap after using and rinsing off the CHG Soap.  9. Pat yourself dry with a CLEAN TOWEL.   10. Wear CLEAN PAJAMAS   11. Place CLEAN SHEETS on your bed the night of your first shower and DO NOT SLEEP WITH PETS.    Day of Surgery: Do not apply any deodorants/lotions. Please wear clean clothes to the hospital/surgery center.      Please read over the following fact sheets that you were given. MRSA Information

## 2016-08-01 ENCOUNTER — Encounter (HOSPITAL_COMMUNITY): Payer: Self-pay

## 2016-08-01 ENCOUNTER — Encounter (HOSPITAL_COMMUNITY)
Admission: RE | Admit: 2016-08-01 | Discharge: 2016-08-01 | Disposition: A | Discharge status: Home / Self Care | Payer: Medicare Other | Source: Ambulatory Visit | Attending: Surgery | Admitting: Surgery

## 2016-08-01 DIAGNOSIS — Z01810 Encounter for preprocedural cardiovascular examination: Secondary | ICD-10-CM | POA: Diagnosis not present

## 2016-08-01 DIAGNOSIS — I739 Peripheral vascular disease, unspecified: Secondary | ICD-10-CM | POA: Diagnosis not present

## 2016-08-01 DIAGNOSIS — Z01812 Encounter for preprocedural laboratory examination: Secondary | ICD-10-CM | POA: Insufficient documentation

## 2016-08-01 HISTORY — DX: Tachycardia, unspecified: R00.0

## 2016-08-01 LAB — CBC
HCT: 36.1 % — ABNORMAL LOW (ref 39.0–52.0)
Hemoglobin: 11.7 g/dL — ABNORMAL LOW (ref 13.0–17.0)
MCH: 27.5 pg (ref 26.0–34.0)
MCHC: 32.4 g/dL (ref 30.0–36.0)
MCV: 84.7 fL (ref 78.0–100.0)
Platelets: 374 K/uL (ref 150–400)
RBC: 4.26 MIL/uL (ref 4.22–5.81)
RDW: 15.6 % — ABNORMAL HIGH (ref 11.5–15.5)
WBC: 8.7 K/uL (ref 4.0–10.5)

## 2016-08-01 LAB — HEMOGLOBIN A1C
HEMOGLOBIN A1C: 8.6 % — AB (ref 4.8–5.6)
MEAN PLASMA GLUCOSE: 200 mg/dL

## 2016-08-01 LAB — COMPREHENSIVE METABOLIC PANEL
ALBUMIN: 3.6 g/dL (ref 3.5–5.0)
ALT: 11 U/L — ABNORMAL LOW (ref 17–63)
ANION GAP: 11 (ref 5–15)
AST: 16 U/L (ref 15–41)
Alkaline Phosphatase: 69 U/L (ref 38–126)
BILIRUBIN TOTAL: 0.5 mg/dL (ref 0.3–1.2)
BUN: 17 mg/dL (ref 6–20)
CO2: 23 mmol/L (ref 22–32)
Calcium: 9 mg/dL (ref 8.9–10.3)
Chloride: 101 mmol/L (ref 101–111)
Creatinine, Ser: 1.46 mg/dL — ABNORMAL HIGH (ref 0.61–1.24)
GFR calc Af Amer: 55 mL/min — ABNORMAL LOW (ref >60)
GFR calc non Af Amer: 48 mL/min — ABNORMAL LOW (ref >60)
GLUCOSE: 124 mg/dL — AB (ref 65–99)
POTASSIUM: 4.5 mmol/L (ref 3.5–5.1)
SODIUM: 135 mmol/L (ref 135–145)
TOTAL PROTEIN: 6.7 g/dL (ref 6.5–8.1)

## 2016-08-01 LAB — PROTIME-INR
INR: 1
Prothrombin Time: 13.2 seconds (ref 11.4–15.2)

## 2016-08-01 LAB — SURGICAL PCR SCREEN
MRSA, PCR: NEGATIVE
STAPHYLOCOCCUS AUREUS: NEGATIVE

## 2016-08-01 LAB — APTT: aPTT: 29 s (ref 24–36)

## 2016-08-01 LAB — GLUCOSE, CAPILLARY: Glucose-Capillary: 110 mg/dL — ABNORMAL HIGH (ref 65–99)

## 2016-08-01 MED ORDER — CHLORHEXIDINE GLUCONATE CLOTH 2 % EX PADS: 6.0000 | MEDICATED_PAD | Freq: Once | CUTANEOUS | Status: DC

## 2016-08-01 NOTE — Progress Notes (Signed)
PCP: [name] at [facility] Pt sees Dr. Gwenlyn Found   EKG: 01/14/16 CXR: 04/11/16 ECHO: 10/10/15 Cath: 10/11/15  Pt denies chest pain, sob, or signs of infection at PAT appointment.   Pt states he was told to stop Plavix 5 days prior to surgery. Pt states he does not remember instruction on aspirin, but he has a paper at home with everything written down. Pt states he will check paper at home for instructions and will call Dr. Stephens Shire office if instructions not on his paper.

## 2016-08-02 NOTE — Progress Notes (Signed)
Anesthesia Chart Review:  Pt is a 70 year old male scheduled for revision fem-fem bypass graft, R femoral endarterectomy with vein patch angioplasty on 08/09/2016 with Harold Barban, MD.   - PCP is [name] in [place], [place] is Quay Burow, MD who referred pt for surgery.   PMH includes:  CAD (PCI to [place]; s/p CABG x5 10/18/15), HTN, PVD, DM, hyperlipidemia. Former smoker. BMI 26  BP (!) 118/47   Pulse 73   Temp 36.7 C   Resp 20   Ht 5\' 7"  (1.702 m)   Wt 164 lb 11.2 oz (74.7 kg)   SpO2 100%   BMI 25.80 kg/m    Medications include: ASA, plavix, iron, glipizide, lisinopril, metformin, metoprolol, protonix, simvastatin.   Preoperative labs reviewed.  HgbA1c 8.6, glucose 124  CXR 04/11/16: No acute cardiopulmonary disease.  EKG 01/12/16: sinus tachycardia (121 bpm).   TEE 10/18/15:  - Left ventricle: The cavity size was normal. Wall thickness was normal. Systolic function was normal. The estimated ejection fraction was in the range of 50% to 55%. There was no dynamic obstruction. Hypokinesis of the anterior and anterolateral myocardium; consistent with ischemia. - Aortic valve: No evidence of vegetation. - Mitral valve: No evidence of vegetation. - Atrial septum: No defect or patent foramen ovale was identified.  If no changes, I anticipate pt can proceed with surgery as scheduled.   Willeen Cass, FNP-BC [facility] Stay Surgical [facility]/Anesthesiology Phone: [phone] 08/02/2016 12:18 PM

## 2016-08-09 ENCOUNTER — Encounter (HOSPITAL_COMMUNITY): Payer: Self-pay | Admitting: Surgery

## 2016-08-09 ENCOUNTER — Inpatient Hospital Stay (HOSPITAL_COMMUNITY): Payer: Medicare Other | Admitting: Anesthesiology

## 2016-08-09 ENCOUNTER — Encounter (HOSPITAL_COMMUNITY)
Admission: RE | Disposition: A | Discharge status: Home / Self Care | Payer: Self-pay | Source: Ambulatory Visit | Attending: Surgery

## 2016-08-09 ENCOUNTER — Inpatient Hospital Stay (HOSPITAL_COMMUNITY): Payer: Medicare Other | Admitting: Emergency Medicine

## 2016-08-09 ENCOUNTER — Inpatient Hospital Stay (HOSPITAL_COMMUNITY)
Admission: RE | Admit: 2016-08-09 | Discharge: 2016-08-10 | DRG: 271 | Disposition: A | Discharge status: Home / Self Care | Payer: Medicare Other | Source: Ambulatory Visit | Attending: Surgery | Admitting: Surgery

## 2016-08-09 DIAGNOSIS — E1151 Type 2 diabetes mellitus with diabetic peripheral angiopathy without gangrene: Secondary | ICD-10-CM | POA: Diagnosis not present

## 2016-08-09 DIAGNOSIS — I771 Stricture of artery: Secondary | ICD-10-CM | POA: Diagnosis not present

## 2016-08-09 DIAGNOSIS — E7800 Pure hypercholesterolemia, unspecified: Secondary | ICD-10-CM | POA: Diagnosis not present

## 2016-08-09 DIAGNOSIS — D62 Acute posthemorrhagic anemia: Secondary | ICD-10-CM | POA: Diagnosis not present

## 2016-08-09 DIAGNOSIS — Z7984 Long term (current) use of oral hypoglycemic drugs: Secondary | ICD-10-CM

## 2016-08-09 DIAGNOSIS — E1140 Type 2 diabetes mellitus with diabetic neuropathy, unspecified: Secondary | ICD-10-CM | POA: Diagnosis present

## 2016-08-09 DIAGNOSIS — I70201 Unspecified atherosclerosis of native arteries of extremities, right leg: Secondary | ICD-10-CM | POA: Diagnosis present

## 2016-08-09 DIAGNOSIS — I745 Embolism and thrombosis of iliac artery: Secondary | ICD-10-CM | POA: Diagnosis not present

## 2016-08-09 DIAGNOSIS — Z9861 Coronary angioplasty status: Secondary | ICD-10-CM

## 2016-08-09 DIAGNOSIS — Z7982 Long term (current) use of aspirin: Secondary | ICD-10-CM

## 2016-08-09 DIAGNOSIS — Z79899 Other long term (current) drug therapy: Secondary | ICD-10-CM

## 2016-08-09 DIAGNOSIS — Z87891 Personal history of nicotine dependence: Secondary | ICD-10-CM | POA: Diagnosis not present

## 2016-08-09 DIAGNOSIS — I739 Peripheral vascular disease, unspecified: Secondary | ICD-10-CM

## 2016-08-09 DIAGNOSIS — Z7902 Long term (current) use of antithrombotics/antiplatelets: Secondary | ICD-10-CM

## 2016-08-09 DIAGNOSIS — I70213 Atherosclerosis of native arteries of extremities with intermittent claudication, bilateral legs: Secondary | ICD-10-CM | POA: Diagnosis not present

## 2016-08-09 DIAGNOSIS — Z951 Presence of aortocoronary bypass graft: Secondary | ICD-10-CM

## 2016-08-09 DIAGNOSIS — I10 Essential (primary) hypertension: Secondary | ICD-10-CM | POA: Diagnosis not present

## 2016-08-09 DIAGNOSIS — I129 Hypertensive chronic kidney disease with stage 1 through stage 4 chronic kidney disease, or unspecified chronic kidney disease: Secondary | ICD-10-CM | POA: Diagnosis not present

## 2016-08-09 DIAGNOSIS — K219 Gastro-esophageal reflux disease without esophagitis: Secondary | ICD-10-CM | POA: Diagnosis present

## 2016-08-09 DIAGNOSIS — N182 Chronic kidney disease, stage 2 (mild): Secondary | ICD-10-CM | POA: Diagnosis not present

## 2016-08-09 HISTORY — PX: FEMORAL-FEMORAL BYPASS GRAFT: SHX936

## 2016-08-09 HISTORY — PX: ENDARTERECTOMY FEMORAL: SHX5804

## 2016-08-09 LAB — GLUCOSE, CAPILLARY
GLUCOSE-CAPILLARY: 131 mg/dL — AB (ref 65–99)
GLUCOSE-CAPILLARY: 142 mg/dL — AB (ref 65–99)
GLUCOSE-CAPILLARY: 135 mg/dL — AB (ref 65–99)

## 2016-08-09 LAB — URINALYSIS, ROUTINE W REFLEX MICROSCOPIC
Bilirubin Urine: NEGATIVE
GLUCOSE, UA: 150 mg/dL — AB
Hgb urine dipstick: NEGATIVE
KETONES UR: NEGATIVE mg/dL
LEUKOCYTES UA: NEGATIVE
NITRITE: NEGATIVE
PROTEIN: NEGATIVE mg/dL
Specific Gravity, Urine: 1.015 (ref 1.005–1.030)
pH: 5 (ref 5.0–8.0)

## 2016-08-09 LAB — CBC
HCT: 30.3 % — ABNORMAL LOW (ref 39.0–52.0)
Hemoglobin: 10.1 g/dL — ABNORMAL LOW (ref 13.0–17.0)
MCH: 28.3 pg (ref 26.0–34.0)
MCHC: 33.3 g/dL (ref 30.0–36.0)
MCV: 84.9 fL (ref 78.0–100.0)
Platelets: 277 10*3/uL (ref 150–400)
RBC: 3.57 MIL/uL — ABNORMAL LOW (ref 4.22–5.81)
RDW: 14.9 % (ref 11.5–15.5)
WBC: 11.4 10*3/uL — ABNORMAL HIGH (ref 4.0–10.5)

## 2016-08-09 LAB — PREPARE RBC (CROSSMATCH)

## 2016-08-09 SURGERY — CREATION, BYPASS, ARTERIAL, FEMORAL TO FEMORAL, USING GRAFT
Anesthesia: General | Site: Groin | Laterality: Right

## 2016-08-09 MED ORDER — ASPIRIN EC 81 MG PO TBEC
81.0000 mg | DELAYED_RELEASE_TABLET | Freq: Every day | ORAL | Status: DC
  Filled 2016-08-09: qty 1

## 2016-08-09 MED ORDER — OXYCODONE HCL 5 MG/5ML PO SOLN: 5.0000 mg | Freq: Once | ORAL | Status: DC | PRN

## 2016-08-09 MED ORDER — FENTANYL CITRATE (PF) 100 MCG/2ML IJ SOLN
INTRAMUSCULAR | Status: AC
  Filled 2016-08-09: qty 2

## 2016-08-09 MED ORDER — EPHEDRINE SULFATE 50 MG/ML IJ SOLN
INTRAMUSCULAR | Status: DC | PRN
  Administered 2016-08-09: 10 mg via INTRAVENOUS
  Administered 2016-08-09: 5 mg via INTRAVENOUS

## 2016-08-09 MED ORDER — HEMOSTATIC AGENTS (NO CHARGE) OPTIME
TOPICAL | Status: DC | PRN
  Administered 2016-08-09: 1 via TOPICAL

## 2016-08-09 MED ORDER — METOPROLOL TARTRATE 50 MG PO TABS
50.0000 mg | ORAL_TABLET | Freq: Two times a day (BID) | ORAL | Status: DC
  Administered 2016-08-09: 50 mg via ORAL
  Filled 2016-08-09 (×2): qty 1

## 2016-08-09 MED ORDER — GLIPIZIDE 5 MG PO TABS
10.0000 mg | ORAL_TABLET | Freq: Two times a day (BID) | ORAL | Status: DC
  Filled 2016-08-09: qty 2

## 2016-08-09 MED ORDER — PROPOFOL 10 MG/ML IV BOLUS
INTRAVENOUS | Status: DC | PRN
  Administered 2016-08-09: 110 mg via INTRAVENOUS

## 2016-08-09 MED ORDER — 0.9 % SODIUM CHLORIDE (POUR BTL) OPTIME
TOPICAL | Status: DC | PRN
  Administered 2016-08-09: 2000 mL
  Administered 2016-08-09: 500 mL

## 2016-08-09 MED ORDER — METOPROLOL TARTRATE 5 MG/5ML IV SOLN: 2.0000 mg | INTRAVENOUS | Status: DC | PRN

## 2016-08-09 MED ORDER — SUGAMMADEX SODIUM 200 MG/2ML IV SOLN
INTRAVENOUS | Status: DC | PRN
  Administered 2016-08-09: 150 mg via INTRAVENOUS

## 2016-08-09 MED ORDER — LACTATED RINGERS IV SOLN
INTRAVENOUS | Status: DC | PRN
  Administered 2016-08-09 (×2): via INTRAVENOUS

## 2016-08-09 MED ORDER — DOCUSATE SODIUM 100 MG PO CAPS
100.0000 mg | ORAL_CAPSULE | Freq: Every day | ORAL | Status: DC
  Filled 2016-08-09: qty 1

## 2016-08-09 MED ORDER — SODIUM CHLORIDE 0.9 % IV SOLN
INTRAVENOUS | Status: DC
  Administered 2016-08-09: 19:00:00 via INTRAVENOUS

## 2016-08-09 MED ORDER — SODIUM CHLORIDE 0.9 % IV SOLN
INTRAVENOUS | Status: DC
Start: 2016-08-09 — End: 2016-08-09

## 2016-08-09 MED ORDER — SUCCINYLCHOLINE CHLORIDE 200 MG/10ML IV SOSY
PREFILLED_SYRINGE | INTRAVENOUS | Status: AC
  Filled 2016-08-09: qty 10

## 2016-08-09 MED ORDER — HYDRALAZINE HCL 20 MG/ML IJ SOLN: 5.0000 mg | INTRAMUSCULAR | Status: DC | PRN

## 2016-08-09 MED ORDER — MORPHINE SULFATE (PF) 2 MG/ML IV SOLN
2.0000 mg | INTRAVENOUS | Status: DC | PRN
  Administered 2016-08-09: 2 mg via INTRAVENOUS
  Filled 2016-08-09: qty 1

## 2016-08-09 MED ORDER — BISACODYL 10 MG RE SUPP: 10.0000 mg | Freq: Every day | RECTAL | Status: DC | PRN

## 2016-08-09 MED ORDER — HEPARIN SODIUM (PORCINE) 1000 UNIT/ML IJ SOLN
INTRAMUSCULAR | Status: AC
  Filled 2016-08-09: qty 1

## 2016-08-09 MED ORDER — LIDOCAINE HCL (CARDIAC) 20 MG/ML IV SOLN
INTRAVENOUS | Status: DC | PRN
  Administered 2016-08-09: 50 mg via INTRATRACHEAL

## 2016-08-09 MED ORDER — GABAPENTIN 300 MG PO CAPS
300.0000 mg | ORAL_CAPSULE | Freq: Three times a day (TID) | ORAL | Status: DC
  Administered 2016-08-09: 300 mg via ORAL
  Filled 2016-08-09 (×2): qty 1

## 2016-08-09 MED ORDER — HEPARIN SODIUM (PORCINE) 1000 UNIT/ML IJ SOLN
INTRAMUSCULAR | Status: DC | PRN
  Administered 2016-08-09: 7000 [IU] via INTRAVENOUS
  Administered 2016-08-09: 1000 [IU] via INTRAVENOUS
  Administered 2016-08-09: 2000 [IU] via INTRAVENOUS

## 2016-08-09 MED ORDER — LABETALOL HCL 5 MG/ML IV SOLN: 10.0000 mg | INTRAVENOUS | Status: DC | PRN

## 2016-08-09 MED ORDER — OXYCODONE-ACETAMINOPHEN 5-325 MG PO TABS
1.0000 | ORAL_TABLET | ORAL | Status: DC | PRN
  Administered 2016-08-10: 2 via ORAL
  Filled 2016-08-09: qty 2

## 2016-08-09 MED ORDER — PHENOL 1.4 % MT LIQD: 1.0000 | OROMUCOSAL | Status: DC | PRN

## 2016-08-09 MED ORDER — FENTANYL CITRATE (PF) 100 MCG/2ML IJ SOLN
INTRAMUSCULAR | Status: AC
  Filled 2016-08-09: qty 4

## 2016-08-09 MED ORDER — MAGNESIUM SULFATE 2 GM/50ML IV SOLN: 2.0000 g | Freq: Every day | INTRAVENOUS | Status: DC | PRN

## 2016-08-09 MED ORDER — ALBUMIN HUMAN 5 % IV SOLN
INTRAVENOUS | Status: DC | PRN
  Administered 2016-08-09 (×2): via INTRAVENOUS

## 2016-08-09 MED ORDER — FERROUS SULFATE 325 (65 FE) MG PO TABS: 325.0000 mg | ORAL_TABLET | Freq: Two times a day (BID) | ORAL | Status: DC

## 2016-08-09 MED ORDER — ACETAMINOPHEN 325 MG RE SUPP: 325.0000 mg | RECTAL | Status: DC | PRN

## 2016-08-09 MED ORDER — SIMVASTATIN 20 MG PO TABS
20.0000 mg | ORAL_TABLET | Freq: Every day | ORAL | Status: DC
  Administered 2016-08-09: 20 mg via ORAL
  Filled 2016-08-09: qty 1

## 2016-08-09 MED ORDER — POLYETHYLENE GLYCOL 3350 17 G PO PACK: 17.0000 g | PACK | Freq: Every day | ORAL | Status: DC | PRN

## 2016-08-09 MED ORDER — METFORMIN HCL 500 MG PO TABS: 2000.0000 mg | ORAL_TABLET | Freq: Two times a day (BID) | ORAL | Status: DC

## 2016-08-09 MED ORDER — PROTAMINE SULFATE 10 MG/ML IV SOLN
INTRAVENOUS | Status: AC
  Filled 2016-08-09: qty 5

## 2016-08-09 MED ORDER — ONDANSETRON HCL 4 MG/2ML IJ SOLN: 4.0000 mg | Freq: Four times a day (QID) | INTRAMUSCULAR | Status: DC | PRN

## 2016-08-09 MED ORDER — INSULIN ASPART 100 UNIT/ML ~~LOC~~ SOLN
0.0000 [IU] | Freq: Three times a day (TID) | SUBCUTANEOUS | Status: DC
Start: 2016-08-10 — End: 2016-08-10

## 2016-08-09 MED ORDER — LISINOPRIL 5 MG PO TABS
5.0000 mg | ORAL_TABLET | Freq: Every day | ORAL | Status: DC
  Filled 2016-08-09: qty 1

## 2016-08-09 MED ORDER — ACETAMINOPHEN 325 MG PO TABS: 325.0000 mg | ORAL_TABLET | ORAL | Status: DC | PRN

## 2016-08-09 MED ORDER — ONDANSETRON HCL 4 MG/2ML IJ SOLN: 4.0000 mg | Freq: Once | INTRAMUSCULAR | Status: DC | PRN

## 2016-08-09 MED ORDER — FERROUS SULFATE 325 (65 FE) MG PO TABS
325.0000 mg | ORAL_TABLET | Freq: Every day | ORAL | Status: DC
  Administered 2016-08-09: 325 mg via ORAL
  Filled 2016-08-09: qty 1

## 2016-08-09 MED ORDER — FENTANYL CITRATE (PF) 100 MCG/2ML IJ SOLN
INTRAMUSCULAR | Status: DC | PRN
  Administered 2016-08-09: 50 ug via INTRAVENOUS
  Administered 2016-08-09: 100 ug via INTRAVENOUS
  Administered 2016-08-09: 50 ug via INTRAVENOUS
  Administered 2016-08-09: 100 ug via INTRAVENOUS

## 2016-08-09 MED ORDER — GLYCOPYRROLATE 0.2 MG/ML IJ SOLN
INTRAMUSCULAR | Status: DC | PRN
  Administered 2016-08-09: .2 mg via INTRAVENOUS

## 2016-08-09 MED ORDER — PROTAMINE SULFATE 10 MG/ML IV SOLN
INTRAVENOUS | Status: DC | PRN
  Administered 2016-08-09: 10 mg via INTRAVENOUS
  Administered 2016-08-09: 40 mg via INTRAVENOUS

## 2016-08-09 MED ORDER — HEPARIN SODIUM (PORCINE) 5000 UNIT/ML IJ SOLN
INTRAMUSCULAR | Status: DC | PRN
  Administered 2016-08-09: 14:00:00

## 2016-08-09 MED ORDER — VITAMIN D 1000 UNITS PO TABS
2000.0000 [IU] | ORAL_TABLET | Freq: Every day | ORAL | Status: DC
  Filled 2016-08-09 (×2): qty 2

## 2016-08-09 MED ORDER — DEXTROSE 5 % IV SOLN
1.5000 g | INTRAVENOUS | Status: AC
  Administered 2016-08-09: 1.5 g via INTRAVENOUS

## 2016-08-09 MED ORDER — ALUM & MAG HYDROXIDE-SIMETH 200-200-20 MG/5ML PO SUSP: 15.0000 mL | ORAL | Status: DC | PRN

## 2016-08-09 MED ORDER — OXYCODONE HCL 5 MG PO TABS: 5.0000 mg | ORAL_TABLET | Freq: Once | ORAL | Status: DC | PRN

## 2016-08-09 MED ORDER — SODIUM CHLORIDE 0.9 % IV SOLN: 500.0000 mL | Freq: Once | INTRAVENOUS | Status: DC | PRN

## 2016-08-09 MED ORDER — ROCURONIUM BROMIDE 50 MG/5ML IV SOSY
PREFILLED_SYRINGE | INTRAVENOUS | Status: AC
  Filled 2016-08-09: qty 10

## 2016-08-09 MED ORDER — ONDANSETRON HCL 4 MG/2ML IJ SOLN
INTRAMUSCULAR | Status: AC
  Filled 2016-08-09: qty 2

## 2016-08-09 MED ORDER — GUAIFENESIN-DM 100-10 MG/5ML PO SYRP
15.0000 mL | ORAL_SOLUTION | ORAL | Status: DC | PRN
  Filled 2016-08-09: qty 15

## 2016-08-09 MED ORDER — LIDOCAINE 2% (20 MG/ML) 5 ML SYRINGE
INTRAMUSCULAR | Status: AC
  Filled 2016-08-09: qty 15

## 2016-08-09 MED ORDER — PHENYLEPHRINE HCL 10 MG/ML IJ SOLN
INTRAMUSCULAR | Status: DC | PRN
  Administered 2016-08-09: 120 ug via INTRAVENOUS
  Administered 2016-08-09: 100 ug via INTRAVENOUS

## 2016-08-09 MED ORDER — POTASSIUM CHLORIDE CRYS ER 20 MEQ PO TBCR: 20.0000 meq | EXTENDED_RELEASE_TABLET | Freq: Every day | ORAL | Status: DC | PRN

## 2016-08-09 MED ORDER — PANTOPRAZOLE SODIUM 40 MG PO TBEC
40.0000 mg | DELAYED_RELEASE_TABLET | Freq: Every day | ORAL | Status: DC
  Filled 2016-08-09: qty 1

## 2016-08-09 MED ORDER — PROPOFOL 10 MG/ML IV BOLUS
INTRAVENOUS | Status: AC
  Filled 2016-08-09: qty 20

## 2016-08-09 MED ORDER — PHENYLEPHRINE HCL 10 MG/ML IJ SOLN
INTRAVENOUS | Status: DC | PRN
  Administered 2016-08-09: 30 ug/min via INTRAVENOUS
  Administered 2016-08-09: 16:00:00 via INTRAVENOUS

## 2016-08-09 MED ORDER — ROCURONIUM BROMIDE 100 MG/10ML IV SOLN
INTRAVENOUS | Status: DC | PRN
  Administered 2016-08-09: 50 mg via INTRAVENOUS

## 2016-08-09 MED ORDER — ONDANSETRON HCL 4 MG/2ML IJ SOLN
INTRAMUSCULAR | Status: DC | PRN
  Administered 2016-08-09: 4 mg via INTRAVENOUS

## 2016-08-09 MED ORDER — FENTANYL CITRATE (PF) 100 MCG/2ML IJ SOLN
25.0000 ug | INTRAMUSCULAR | Status: DC | PRN
  Administered 2016-08-09: 25 ug via INTRAVENOUS

## 2016-08-09 MED ORDER — SODIUM CHLORIDE 0.9 % IV SOLN: Freq: Once | INTRAVENOUS | Status: DC

## 2016-08-09 MED ORDER — DEXTROSE 5 % IV SOLN
1.5000 g | Freq: Two times a day (BID) | INTRAVENOUS | Status: DC
  Administered 2016-08-10: 1.5 g via INTRAVENOUS
  Filled 2016-08-09 (×2): qty 1.5

## 2016-08-09 MED ORDER — FERROUS SULFATE 325 (65 FE) MG PO TABS
650.0000 mg | ORAL_TABLET | Freq: Every day | ORAL | Status: DC
  Filled 2016-08-09: qty 2

## 2016-08-09 MED ORDER — DEXTROSE 5 % IV SOLN
INTRAVENOUS | Status: AC
  Filled 2016-08-09: qty 1.5

## 2016-08-09 SURGICAL SUPPLY — 56 items
ADH SKN CLS APL DERMABOND .7 (GAUZE/BANDAGES/DRESSINGS) ×2
ADH SKN CLS LQ APL DERMABOND (GAUZE/BANDAGES/DRESSINGS) ×2
CANISTER SUCTION 2500CC (MISCELLANEOUS) ×3 IMPLANT
CATH EMB 5FR 80CM (CATHETERS) ×1 IMPLANT
CLIP TI MEDIUM 24 (CLIP) ×3 IMPLANT
CLIP TI WIDE RED SMALL 24 (CLIP) ×3 IMPLANT
DERMABOND ADHESIVE PROPEN (GAUZE/BANDAGES/DRESSINGS) ×1
DERMABOND ADVANCED (GAUZE/BANDAGES/DRESSINGS) ×1
DERMABOND ADVANCED .7 DNX12 (GAUZE/BANDAGES/DRESSINGS) ×2 IMPLANT
DERMABOND ADVANCED .7 DNX6 (GAUZE/BANDAGES/DRESSINGS) IMPLANT
DRAIN CHANNEL 15F RND FF W/TCR (WOUND CARE) IMPLANT
DRAPE X-RAY CASS 24X20 (DRAPES) IMPLANT
ELECT REM PT RETURN 9FT ADLT (ELECTROSURGICAL) ×3
ELECTRODE REM PT RTRN 9FT ADLT (ELECTROSURGICAL) ×2 IMPLANT
EVACUATOR SILICONE 100CC (DRAIN) IMPLANT
GLOVE BIO SURGEON STRL SZ 6.5 (GLOVE) ×2 IMPLANT
GLOVE BIOGEL PI IND STRL 6.5 (GLOVE) IMPLANT
GLOVE BIOGEL PI IND STRL 7.5 (GLOVE) ×2 IMPLANT
GLOVE BIOGEL PI INDICATOR 6.5 (GLOVE) ×1
GLOVE BIOGEL PI INDICATOR 7.5 (GLOVE) ×2
GLOVE ECLIPSE 6.5 STRL STRAW (GLOVE) ×2 IMPLANT
GLOVE SURG SS PI 6.5 STRL IVOR (GLOVE) ×1 IMPLANT
GLOVE SURG SS PI 7.0 STRL IVOR (GLOVE) ×1 IMPLANT
GLOVE SURG SS PI 7.5 STRL IVOR (GLOVE) ×4 IMPLANT
GOWN STRL REUS W/ TWL LRG LVL3 (GOWN DISPOSABLE) ×4 IMPLANT
GOWN STRL REUS W/ TWL XL LVL3 (GOWN DISPOSABLE) ×2 IMPLANT
GOWN STRL REUS W/TWL LRG LVL3 (GOWN DISPOSABLE) ×9
GOWN STRL REUS W/TWL XL LVL3 (GOWN DISPOSABLE) ×6
GRAFT HEMASHIELD 8MM (Vascular Products) ×3 IMPLANT
GRAFT VASC STRG 30X8KNIT (Vascular Products) IMPLANT
HEMOSTAT SNOW SURGICEL 2X4 (HEMOSTASIS) IMPLANT
KIT BASIN OR (CUSTOM PROCEDURE TRAY) ×3 IMPLANT
KIT ROOM TURNOVER OR (KITS) ×3 IMPLANT
NS IRRIG 1000ML POUR BTL (IV SOLUTION) ×6 IMPLANT
PACK PERIPHERAL VASCULAR (CUSTOM PROCEDURE TRAY) ×3 IMPLANT
PAD ARMBOARD 7.5X6 YLW CONV (MISCELLANEOUS) ×6 IMPLANT
PATCH VASC XENOSURE 1CMX6CM (Vascular Products) ×3 IMPLANT
PATCH VASC XENOSURE 1X6 (Vascular Products) IMPLANT
SET COLLECT BLD 21X3/4 12 (NEEDLE) IMPLANT
STOPCOCK 4 WAY LG BORE MALE ST (IV SETS) ×2 IMPLANT
SUT ETHILON 3 0 PS 1 (SUTURE) IMPLANT
SUT PROLENE 5 0 C 1 24 (SUTURE) ×15 IMPLANT
SUT PROLENE 5 0 C 1 36 (SUTURE) ×1 IMPLANT
SUT PROLENE 6 0 BV (SUTURE) ×3 IMPLANT
SUT PROLENE 6 0 C 1 24 (SUTURE) ×3 IMPLANT
SUT VIC AB 2-0 CT1 27 (SUTURE) ×6
SUT VIC AB 2-0 CT1 TAPERPNT 27 (SUTURE) ×4 IMPLANT
SUT VIC AB 3-0 SH 27 (SUTURE) ×6
SUT VIC AB 3-0 SH 27X BRD (SUTURE) ×4 IMPLANT
SUT VICRYL 4-0 PS2 18IN ABS (SUTURE) ×6 IMPLANT
SYR 3ML LL SCALE MARK (SYRINGE) ×1 IMPLANT
TRAY CATH 16FR W/PLASTIC CATH (SET/KITS/TRAYS/PACK) ×1 IMPLANT
TRAY FOLEY W/METER SILVER 16FR (SET/KITS/TRAYS/PACK) ×3 IMPLANT
TUBING EXTENTION W/L.L. (IV SETS) IMPLANT
UNDERPAD 30X30 (UNDERPADS AND DIAPERS) ×3 IMPLANT
WATER STERILE IRR 1000ML POUR (IV SOLUTION) ×3 IMPLANT

## 2016-08-09 NOTE — Brief Op Note (Signed)
08/09/2016  5:13 PM  PATIENT:  [name]  70 y.o. 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  Right re-do femoral artery exposure 2.  Right ilio-femoral endarterectomy 3.  Bovine pericardial patch angioplasty right common femoral artery 4.  Re-do right limb of right to left femoral femoral bypass  SURGEON:  Surgeon(s) and Role:    Serafina Mitchell, MD - Primary  PHYSICIAN ASSISTANT:   ASSISTANTS: S. Rhyne   ANESTHESIA:   general  EBL:  Total I/O In: [id number] [I.V.:1000; Blood:335; IV Piggyback:500] Out: 800 [Blood:800]  BLOOD ADMINISTERED:250 CC PRBC  DRAINS: none   LOCAL MEDICATIONS USED:  NONE  SPECIMEN:  No Specimen  DISPOSITION OF SPECIMEN:  N/A  COUNTS:  YES  TOURNIQUET:  * No tourniquets in log *  DICTATION: .Dragon Dictation  PLAN OF CARE: Admit to inpatient   PATIENT DISPOSITION:  PACU - hemodynamically stable.   Delay start of Pharmacological VTE agent (>24hrs) due to surgical blood loss or risk of bleeding: yes

## 2016-08-09 NOTE — Transfer of Care (Signed)
Immediate Anesthesia Transfer of Care Note  Patient: [name]  Procedure(s) Performed: Procedure(s): REVISION BYPASS GRAFT RIGHT FEMORAL-LEFT FEMORAL ARTERY (Bilateral) ENDARTERECTOMY FEMORAL WITH VEIN PATCH ANGIOPLASTY (Right)  Patient Location: PACU  Anesthesia Type:General  Level of Consciousness: awake, alert , oriented and patient cooperative  Airway & Oxygen Therapy: Patient Spontanous Breathing and Patient connected to face mask oxygen  Post-op Assessment: Report given to RN and Post -op Vital signs reviewed and stable  Post vital signs: Reviewed and stable  Last Vitals:  Vitals:   08/09/16 0915 08/09/16 1733  BP: 139/64   Pulse: 72   Resp: 18   Temp: 36.7 C (P) 36.3 C    Last Pain:  Vitals:   08/09/16 1733  TempSrc:   PainSc: (P) 0-No pain      Patients Stated Pain Goal: 3 ([id number])  Complications: No apparent anesthesia complications

## 2016-08-09 NOTE — Interval H&P Note (Signed)
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 After consideration of risks, benefits and other options for treatment, the patient has consented to  Procedure(s): REVISION BYPASS GRAFT RIGHT FEMORAL-LEFT FEMORAL ARTERY (Bilateral) ENDARTERECTOMY FEMORAL WITH VEIN PATCH ANGIOPLASTY (Right) as a surgical intervention .  The patient's history has been reviewed, patient examined, no change in status, stable for surgery.  I have reviewed the patient's chart and labs.  Questions were answered to the patient's satisfaction.     [name]

## 2016-08-09 NOTE — Progress Notes (Signed)
  Day of Surgery Note    Subjective:  No complaints  Vitals:   08/09/16 0915  BP: 139/64  Pulse: 72  Resp: 18  Temp: 98 F (36.7 C)    Incisions:   Right groin is soft without hematoma Extremities:  + PT/AT doppler signal on the right; +PT/peroneal doppler signal on the left Cardiac:  regular Lungs:  Non labored   Assessment/Plan:  This is a 70 y.o. male who is s/p  1.  Right re-do femoral artery exposure 2.  Right ilio-femoral endarterectomy 3.  Bovine pericardial patch angioplasty right common femoral artery 4.  Re-do right limb of right to left femoral femoral bypass  -pt doing well in pacu with +doppler signals bilateral feet - + doppler signal present in fem fem bypass -acute surgical blood loss anemia-received 1 unit PRBC's intraoperatively -to 3 south when bed available   Leontine Locket, PA-C 08/09/2016 5:36 PM [phone]

## 2016-08-09 NOTE — H&P (View-Only) (Signed)
History of Present Illness:  Patient is a 70 y.o. year old male who presents for evaluation of claudication in bilateral LE progressing over the past year.   He had surgery with Dr. Amedeo Plenty in 2008-2010 right to left fem-fem by pass.  He has also had right external iliac stent, right SFA stent now occluded. Doppler studies revealing an occluded left iliac with high-grade right external iliac artery stenosis, occluded SFAs bilaterally with occluded left femoropopliteal bypass graft and a patent right to left femorofemoral crossover graft.. His right ABI was 0.6 by the left was 0.88. He is more symptomatic on the right and does have lifestyle limiting claudication. He under  went angiography of the aorta with bilateral runoff by Dr. Gwenlyn Found which showed:  a patent right-to-left femorofemoral crossover graft, occluded left common iliac artery, occluded SFAs bilaterally, patent right external iliac artery stent with a 95% calcified lesion in the right common femoral artery just proximal to the anastomosis of his temporal crossover graft.  There is not rest pain.  There is no history of ulcerations on the feet.  Atherosclerotic risk factors and other medical problems include DM, HTN managed with Metoprolol and Lisinopril, hypercholesterolemia managed with Zocor.  He takes Plavix and a daily 81 mg Asprin.  He has had 5 vessel by pass coronary artery surgery March 2017.  Past Medical History:  Diagnosis Date  . CAD S/P percutaneous coronary angioplasty 1998   PCI TO CX  . Diabetes mellitus without complication ([place])   . GERD (gastroesophageal reflux disease)   . 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 Hypercholesteremia   . Hypertension   . Neuropathy ([place])   . Peripheral vascular disease ([place])    s/p R-L FEM-FEM BYPASS    Past Surgical History:  Procedure Laterality Date  . CARDIAC CATHETERIZATION  2003   with stent  . CARDIAC CATHETERIZATION N/A 10/11/2015   Procedure: Left Heart Cath and Coronary  Angiography;  Surgeon: Peter M Martinique, MD;  Location: [facility];  Service: Cardiovascular;  Laterality: N/A;  . COLONOSCOPY N/A 09/22/2013   Procedure: COLONOSCOPY;  Surgeon: Daneil Dolin, MD;  [facility];  Service: Endoscopy;  Laterality: N/A;  9:30 AM  . CORONARY ARTERY BYPASS GRAFT N/A 10/18/2015   Procedure: CORONARY ARTERY BYPASS GRAFTING (CABG) x  five, using left internal mammary artery and right leg greater saphenous vein harvested endoscopically;  Surgeon: [name], MD;  Location: [facility];  Service: Open Heart Surgery;  Laterality: N/A;  . FEMORAL-POPLITEAL BYPASS GRAFT    . PERIPHERAL VASCULAR CATHETERIZATION N/A 05/08/2016   Procedure: Lower Extremity Angiography;  Surgeon: Lorretta Harp, MD;  Location: [facility];  Service: Cardiovascular;  Laterality: N/A;  . TEE WITHOUT CARDIOVERSION N/A 10/18/2015   Procedure: TRANSESOPHAGEAL ECHOCARDIOGRAM (TEE);  Surgeon: [name], MD;  Location: [facility];  Service: Open Heart Surgery;  Laterality: N/A;    ROS:   General:  No weight loss, Fever, chills  HEENT: No recent headaches, no nasal bleeding, no visual changes, no sore throat  Neurologic: No dizziness, blackouts, seizures. No recent symptoms of stroke or mini- stroke. No recent episodes of slurred speech, or temporary blindness.  Cardiac: No recent episodes of chest pain/pressure, no shortness of breath at rest.  No shortness of breath with exertion.  Denies history of atrial fibrillation or irregular heartbeat  Vascular: No history of rest pain in feet.  No history of claudication.  No history of non-healing ulcer, No history of DVT   Pulmonary: No home oxygen, no productive cough, no hemoptysis,  No asthma or wheezing  Musculoskeletal:  [ ]  Arthritis, [ ]  Low back pain,  [ ]  Joint pain  Hematologic:No history of hypercoagulable state.  No history of easy bleeding.  No history of anemia  Gastrointestinal: No hematochezia or melena,   No gastroesophageal reflux, no trouble swallowing  Urinary: [ ]  chronic Kidney disease, [ ]  on HD - [ ]  MWF or [ ]  TTHS, [ ]  Burning with urination, [ ]  Frequent urination, [ ]  Difficulty urinating;   Skin: No rashes  Psychological: No history of anxiety,  No history of depression  Social History Social History  Substance Use Topics  . Smoking status: Former Smoker    Packs/day: 2.00    Years: 30.00    Types: Cigarettes    Quit date: 08/14/2000  . Smokeless tobacco: Never Used  . Alcohol use No    Family History Family History  Problem Relation Age of Onset  . Heart attack Mother   . Stroke Mother   . Colon cancer Neg Hx     Allergies  No Known Allergies   Current Outpatient Prescriptions  Medication Sig Dispense Refill  . aspirin 81 MG tablet Take 81 mg by mouth daily.    . Cholecalciferol (VITAMIN D) 2000 UNITS tablet Take 2,000 Units by mouth daily.    . clopidogrel (PLAVIX) 75 MG tablet Take 75 mg by mouth daily with breakfast.    . ferrous sulfate 325 (65 FE) MG tablet Take 325 mg by mouth 2 (two) times daily with a meal. 650mg  in the morning and 325mg  in the evening     . gabapentin (NEURONTIN) 300 MG capsule Take 300 mg by mouth 3 (three) times daily.     [name] glipiZIDE (GLUCOTROL) 5 MG tablet Take 10 mg by mouth 2 (two) times daily before a meal.     . lisinopril (PRINIVIL,ZESTRIL) 5 MG tablet Take 5 mg by mouth daily.     . metFORMIN (GLUCOPHAGE) 1000 MG tablet Take 2,000 mg by mouth 2 (two) times daily with a meal.     . metoprolol (LOPRESSOR) 50 MG tablet Take 1 tablet (50 mg total) by mouth 2 (two) times daily. 180 tablet 3  . pantoprazole (PROTONIX) 40 MG tablet Take 40 mg by mouth daily.    . simvastatin (ZOCOR) 40 MG tablet Take 20 mg by mouth at bedtime.     [name] levofloxacin (LEVAQUIN) 500 MG tablet Take 1 tablet (500 mg total) by mouth daily. 5 tablet 0  . oxyCODONE (OXY IR/ROXICODONE) 5 MG immediate release tablet Take 1-2 tablets (5-10 mg total) by mouth  every 4 (four) hours as needed for severe pain. (Patient not taking: Reported on 07/17/2016) 30 tablet 0   No current facility-administered medications for this visit.     Physical Examination  Vitals:   07/17/16 1254  BP: 102/68  Pulse: 79  Temp: 97.1 F (36.2 C)  TempSrc: Oral  SpO2: 98%  Weight: 165 lb 14.4 oz (75.3 kg)  Height: 5\' 7"  (1.702 m)    Body mass index is 25.98 kg/m.  General:  Alert and oriented, no acute distress HEENT: Normal Neck: No bruit or JVD Pulmonary: Clear to auscultation bilaterally Cardiac: Regular Rate and Rhythm without murmur Abdomen: Soft, non-tender, non-distended, no mass, no scars Skin: No rash Extremity Pulses:  2+ radial, brachial, femoral, dorsalis pedis, posterior tibial pulses bilaterally Musculoskeletal:  No deformity or edema  Neurologic: Upper and lower extremity motor 5/5 and symmetric  DATA:   Doppler studies:  revealing an occluded left iliac with high-grade right external iliac artery stenosis, occluded SFAs bilaterally with occluded left femoropopliteal bypass graft and a patent right to left femorofemoral crossover graft.. His right ABI was 0.6 by the left was 0.88.   Angiogram: patent right-to-left femorofemoral crossover graft, occluded left common iliac artery, occluded SFAs bilaterally, patent right external iliac artery stent with a 95% calcified lesion in the right common femoral artery just proximal to the anastomosis of his temporal crossover graft.    ASSESSMENT:  PAD with symptoms of claudication bilaterally. Stenosis of the right femoral artery is causing decreased blood flow to bilateral LE.    PLAN:   Dr. Trula Slade discussed surgical options with him and his wife and the plan is for right femoral endarterectomy with right to left fem-fem by pass revision.  Wed. Dec. 27th 2017.   Theda Sers, EMMA MAUREEN PA-C [facility]    I agree with the above.  I have seen and evaluated  the patient.  He has a history of a right to left femoral-femoral bypass graft by Dr. Amedeo Plenty in the remote past.  He has also undergone left femoral popliteal bypass grafting and stenting to his right superficial femoral artery, both of which have occluded.  He has had progressive symptoms of claudication and recently underwent angiography by Dr. Alvester Chou which revealed significant right common femoral and distal external iliac stenosis, near the arterial anastomosis of the femorofemoral bypass graft.  Percutaneous intervention was not recommended and he was referred to surgery.  I have discussed with the patient proceeding with a right femoral endarterectomy and patch angioplasty.  I will plan on removing the femoral-femoral graft, then doing the femoral endarterectomy, followed by patch angioplasty and then reimplanting the femoral-femoral bypass graft.  I discussed the risks of the operation with the patient and he is okay to proceed.  His Plavix will be discontinued.  His operations been scheduled for Wednesday, December 27.  [name]

## 2016-08-09 NOTE — Anesthesia Preprocedure Evaluation (Addendum)
Anesthesia Evaluation  Patient identified by MRN, date of birth, ID band Patient awake    Reviewed: Allergy & Precautions, NPO status , Patient's Chart, lab work & pertinent test results  Airway Mallampati: II  TM Distance: >3 FB Neck ROM: Full    Dental  (+) Edentulous Upper   Pulmonary former smoker,    breath sounds clear to auscultation       Cardiovascular hypertension,  Rhythm:Regular Rate:Normal     Neuro/Psych    GI/Hepatic   Endo/Other  diabetes  Renal/GU      Musculoskeletal   Abdominal   Peds  Hematology   Anesthesia Other Findings   Reproductive/Obstetrics                           Anesthesia Physical Anesthesia Plan  ASA: III  Anesthesia Plan: General   Post-op Pain Management:    Induction: Intravenous  Airway Management Planned: Oral ETT  Additional Equipment: Arterial line  Intra-op Plan:   Post-operative Plan: Extubation in OR  Informed Consent: I have reviewed the patients History and Physical, chart, labs and discussed the procedure including the risks, benefits and alternatives for the proposed anesthesia with the patient or authorized representative who has indicated his/her understanding and acceptance.   Dental advisory given  Plan Discussed with: CRNA and Anesthesiologist  Anesthesia Plan Comments:         Anesthesia Quick Evaluation  

## 2016-08-10 ENCOUNTER — Encounter (HOSPITAL_COMMUNITY): Payer: Medicare Other

## 2016-08-10 ENCOUNTER — Other Ambulatory Visit: Payer: Self-pay

## 2016-08-10 ENCOUNTER — Encounter (HOSPITAL_COMMUNITY): Payer: Self-pay | Admitting: Surgery

## 2016-08-10 DIAGNOSIS — Z48812 Encounter for surgical aftercare following surgery on the circulatory system: Secondary | ICD-10-CM

## 2016-08-10 DIAGNOSIS — I739 Peripheral vascular disease, unspecified: Secondary | ICD-10-CM

## 2016-08-10 DIAGNOSIS — I70201 Unspecified atherosclerosis of native arteries of extremities, right leg: Secondary | ICD-10-CM

## 2016-08-10 DIAGNOSIS — I70213 Atherosclerosis of native arteries of extremities with intermittent claudication, bilateral legs: Secondary | ICD-10-CM

## 2016-08-10 LAB — TYPE AND SCREEN
Blood Product Expiration Date: 201712302359
Blood Product Expiration Date: 201801012359
ISSUE DATE / TIME: 201712271631
ISSUE DATE / TIME: 201712271631
UNIT TYPE AND RH: 6200
Unit Type and Rh: 6200

## 2016-08-10 LAB — BASIC METABOLIC PANEL
Anion gap: 6 (ref 5–15)
BUN: 17 mg/dL (ref 6–20)
CO2: 24 mmol/L (ref 22–32)
CREATININE: 1.2 mg/dL (ref 0.61–1.24)
Calcium: 7.7 mg/dL — ABNORMAL LOW (ref 8.9–10.3)
Chloride: 102 mmol/L (ref 101–111)
GFR calc Af Amer: >60 mL/min (ref >60)
Glucose, Bld: 131 mg/dL — ABNORMAL HIGH (ref 65–99)
Potassium: 4.8 mmol/L (ref 3.5–5.1)
Sodium: 132 mmol/L — ABNORMAL LOW (ref 135–145)

## 2016-08-10 LAB — CBC
HCT: 27.3 % — ABNORMAL LOW (ref 39.0–52.0)
Hemoglobin: 9.1 g/dL — ABNORMAL LOW (ref 13.0–17.0)
MCH: 27.9 pg (ref 26.0–34.0)
MCHC: 33.3 g/dL (ref 30.0–36.0)
MCV: 83.7 fL (ref 78.0–100.0)
PLATELETS: 312 10*3/uL (ref 150–400)
RBC: 3.26 MIL/uL — ABNORMAL LOW (ref 4.22–5.81)
RDW: 15.2 % (ref 11.5–15.5)
WBC: 7.8 10*3/uL (ref 4.0–10.5)

## 2016-08-10 LAB — POCT I-STAT 7, (LYTES, BLD GAS, ICA,H+H)
Acid-base deficit: 6 mmol/L — ABNORMAL HIGH (ref 0.0–2.0)
BICARBONATE: 18.6 mmol/L — AB (ref 20.0–28.0)
CALCIUM ION: 1.06 mmol/L — AB (ref 1.15–1.40)
HEMATOCRIT: 21 % — AB (ref 39.0–52.0)
Hemoglobin: 7.1 g/dL — ABNORMAL LOW (ref 13.0–17.0)
O2 SAT: 100 %
PCO2 ART: 29.3 mmHg — AB (ref 32.0–48.0)
POTASSIUM: 3.5 mmol/L (ref 3.5–5.1)
Patient temperature: 36.4
Sodium: 141 mmol/L (ref 135–145)
TCO2: 20 mmol/L (ref 0–100)
pH, Arterial: 7.408 (ref 7.350–7.450)
pO2, Arterial: 191 mmHg — ABNORMAL HIGH (ref 83.0–108.0)

## 2016-08-10 LAB — GLUCOSE, CAPILLARY
GLUCOSE-CAPILLARY: 231 mg/dL — AB (ref 65–99)
Glucose-Capillary: 166 mg/dL — ABNORMAL HIGH (ref 65–99)

## 2016-08-10 MED ORDER — CLOPIDOGREL BISULFATE 75 MG PO TABS: 75.0000 mg | ORAL_TABLET | Freq: Every day | ORAL | Status: DC

## 2016-08-10 MED ORDER — OXYCODONE-ACETAMINOPHEN 5-325 MG PO TABS: 1.0000 | ORAL_TABLET | Freq: Four times a day (QID) | ORAL | 0 refills | Status: DC | PRN

## 2016-08-10 NOTE — Progress Notes (Signed)
  Progress Note    08/10/2016 7:46 AM 1 Day Post-Op  Subjective:  C/o a little soreness at incision  Afebrile HR  50's-80's NSR 123XX123 systolic 123XX123 RA  Vitals:   08/10/16 0352 08/10/16 0700  BP: 116/63 (!) 94/51  Pulse: 71 73  Resp: 14 14  Temp: 97.9 F (36.6 C) 97.9 F (36.6 C)    Physical Exam: Cardiac:  regular Lungs:  Non labored Incisions:  Clean and dry without hematoma Extremities:  + doppler signals right AT/PT and left PT/peroneal; motor and sensory are in tact.   CBC    Component Value Date/Time   WBC 7.8 08/10/2016 0442   RBC 3.26 (L) 08/10/2016 0442   HGB 9.1 (L) 08/10/2016 0442   HCT 27.3 (L) 08/10/2016 0442   PLT 312 08/10/2016 0442   MCV 83.7 08/10/2016 0442   MCH 27.9 08/10/2016 0442   MCHC 33.3 08/10/2016 0442   RDW 15.2 08/10/2016 0442   LYMPHSABS 1,827 04/25/2016 1038   MONOABS 957 (H) 04/25/2016 1038   EOSABS 522 (H) 04/25/2016 1038   BASOSABS 87 04/25/2016 1038    BMET    Component Value Date/Time   NA 132 (L) 08/10/2016 0442   K 4.8 08/10/2016 0442   CL 102 08/10/2016 0442   CO2 24 08/10/2016 0442   GLUCOSE 131 (H) 08/10/2016 0442   BUN 17 08/10/2016 0442   CREATININE 1.20 08/10/2016 0442   CREATININE 1.28 (H) 04/25/2016 1038   CALCIUM 7.7 (L) 08/10/2016 0442   GFRNONAA >60 08/10/2016 0442   GFRAA >60 08/10/2016 0442    INR    Component Value Date/Time   INR 1.00 08/01/2016 1038     Intake/Output Summary (Last 24 hours) at 08/10/16 0746 Last data filed at 08/10/16 0644  Gross per 24 hour  Intake          2881.25 ml  Output             2500 ml  Net           381.25 ml     Assessment:  70 y.o. male is s/p:  1. Right re-do femoral artery exposure 2. Right ilio-femoral endarterectomy 3. Bovine pericardial patch angioplasty right common femoral artery 4. Re-do right limb of right to left femoral femoral bypass  1 Day Post-Op  Plan: -pt is doing well this am with + doppler signals bilateral feet.  Pt is  improved from pre-op -pt has ambulated, voided and pain is controlled -acute surgical blood loss anemia-transfused one unit PRBC's yesterday -restart Plavix today -will dc home today and f/u with Dr. Trula Slade in 2-3 weeks. -discussed groin wound care with the pt and his wife   Leontine Locket, [facility] [phone] 08/10/2016 7:46 AM  I agree with the above.  I have seen and evaluated the patient.  His incision is healing nicely.  He has ambulated and voided without difficulty.  He would like to go home.  I'll follow-up with me in 2 weeks.  [name]

## 2016-08-10 NOTE — Care Management Note (Signed)
Case Management Note  Patient Details  Name: [name] MRN: [id number] Date of Birth: [birth year]  Subjective/Objective:   S/p redo femoral endarterectomy, from home,for dc today, no needs.                    Action/Plan:   Expected Discharge Date:                  Expected Discharge Plan:  Home/Self Care  In-House Referral:     Discharge planning Services  CM Consult  Post Acute Care Choice:    Choice offered to:     DME Arranged:    DME Agency:     HH Arranged:    HH Agency:     Status of Service:  Completed, signed off  If discussed at [employer] of Stay Meetings, dates discussed:    Additional Comments:  Zenon Mayo, RN 08/10/2016, 11:26 AM

## 2016-08-10 NOTE — Anesthesia Postprocedure Evaluation (Signed)
Anesthesia Post Note  Patient: [name]  Procedure(s) Performed: Procedure(s) (LRB): REVISION BYPASS GRAFT RIGHT FEMORAL-LEFT FEMORAL ARTERY (Bilateral) ENDARTERECTOMY FEMORAL WITH VEIN PATCH ANGIOPLASTY (Right)  Patient location during evaluation: PACU Anesthesia Type: General Level of consciousness: awake, sedated and patient cooperative Pain management: pain level controlled Vital Signs Assessment: post-procedure vital signs reviewed and stable Respiratory status: spontaneous breathing, nonlabored ventilation, respiratory function stable and patient connected to nasal cannula oxygen Cardiovascular status: blood pressure returned to baseline and stable Postop Assessment: no signs of nausea or vomiting Anesthetic complications: no       Last Vitals:  Vitals:   08/10/16 0700 08/10/16 1100  BP: (!) 94/51 (!) 152/68  Pulse: 73 92  Resp: 14 10  Temp: 36.6 C 37.2 C    Last Pain:  Vitals:   08/10/16 1100  TempSrc: Oral  PainSc:                  [name],JAMES TERRILL

## 2016-08-10 NOTE — Op Note (Signed)
Patient name: [name] MRN: [id number] DOB: 07-11-1946 Sex: male  08/09/2016 Pre-operative Diagnosis: Bilateral lower extremity claudication Post-operative diagnosis:  Same Surgeon:  [name] Assistants:  Leontine Locket Procedure:   #1: Redo right femoral artery exposure   #2: Right iliofemoral endarterectomy   #3: Bovine pericardial patch angioplasty, right common femoral artery   #4: Redo right limb of right to left femoral-femoral bypass graft Anesthesia:  Gen. Blood Loss:  See anesthesia record Specimens:  None  Findings:  Nearly occlusive plaque within the right common femoral artery extending up into the distal external iliac artery.  After the endarterectomy, a bovine pericardial patch was placed on the common femoral artery.  I then placed the right limb of the femoral-femoral graft back onto the bovine patch  Indications:  The patient has a history of a right to left femoral-femoral bypass graft by Dr. Amedeo Plenty many years ago.  He has also undergone a left femoral-popliteal bypass graft which is occluded.  He is having worsening symptoms.  Angiography revealed severe inflow disease and common femoral disease, therefore he is here today for a femoral endarterectomy.  Procedure:  The patient was identified in the holding area and taken to [place] [facility]  The patient was then placed supine on the table. general anesthesia was administered.  The patient was prepped and draped in the usual sterile fashion.  A time out was called and antibiotics were administered.  A longitudinal incision was made in the right groin.  Cautery was used to divide subcutaneous tissue.  With the use of cautery and sharp dissection, I dissected out the right common femoral, profundofemoral, and superficial femoral artery as well as the right limb of the femoral-femoral graft.  There was a moderate amount of scar tissue in the right groin.  Once I had adequate exposure, the patient was fully heparinized.  I  then occluded the common femoral, profundofemoral, and superficial femoral artery as well as the femoral-femoral bypass graft with vascular clamps.  A used a #11 blade to remove the right limb of the femoral-femoral graft from the common femoral artery.  There was a nearly occlusive calcified plaque in the common femoral artery.  There is also significantly thickened neointima.  Endarterectomy was then performed using a Hector [place].  I inserted a #5 [place] up into the iliac artery so that I could extend the endarterectomy up into the external iliac artery.  Excellent inflow was reestablished.  I achieved a good distal endpoint just proximal to the profunda.  I did not need to tack the plaque down.  There were 2 large profunda branches which were protected.  I then performed patch angioplasty using a bovine pericardial patch.  This was done using a running 5-0 Prolene.  Prior to completion the appropriate flushing maneuvers were performed and the anastomosis was completed.  Several repair stitches were required for hemostasis.  Next in order to make sure that there was adequate length from the femoral-femoral graft to reach the bovine patch, I placed a interposition new 8 mm dacryon graft attached to the old graft and a end to end fashion with 5-0 Prolene.  I was unable to have adequate length to perform the anastomosis to the bovine pericardial patch.  I reoccluded the common femoral artery proximally and distally.  I used #11 blade to make a arteriotomy in the bovine patch.  Tenotomy scissors were used to open the patch to accommodate the  limb of the 8  mm dacryon graft.  The graft was cut to the appropriate length and a running end-to-side anastomosis was performed with 5-0 Prolene.  Prior to completion the appropriate flushing maneuvers were performed and the anastomosis was completed.  There was an excellent pulse within the femoral-femoral graft as well as in bilateral common femoral arteries.  At  this point, 50 mg of protamine was given.  Once hemostasis was satisfactory, the incision was closed with multiple layers of 2-0, 3-0 Vicryl followed by 4-0 Vicryl on the skin and Dermabond.  The patient was successfully extubated.  There were no immediate complications.  Disposition:  To PACU in stable condition.   Theotis Burrow, M.D. [facility] Office: [phone] Pager:  [phone]

## 2016-08-10 NOTE — Discharge Summary (Signed)
Discharge Summary     [name] 09/14/1945 70 y.o. 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 male who presents for evaluation of claudication in bilateral LE progressing over the past year.   He had surgery with Dr. Amedeo Plenty in 2008-2010 right to left fem-fem by pass.  He has also had right external iliac stent, right SFA stent now occluded. Doppler studies revealing an occluded left iliac with high-grade right external iliac artery stenosis, occluded SFAs bilaterally with occluded left femoropopliteal bypass graft and a patent right to left femorofemoral crossover graft.. His right ABI was 0.6 by the left was 0.88. He is more symptomatic on the right and does have lifestyle limiting claudication. He under  went angiography of the aorta with bilateral runoff by Dr. Gwenlyn Found which showed:  a patent right-to-left femorofemoral crossover graft, occluded left common iliac artery, occluded SFAs bilaterally, patent right external iliac artery stent with a 95% calcified lesion in the right common femoral artery just proximal to the anastomosis of his temporal crossover graft.  There is not rest pain.  There is no history of ulcerations on the feet.  Atherosclerotic risk factors and other medical problems include DM, HTN managed with Metoprolol and Lisinopril, hypercholesterolemia managed with Zocor.  He takes Plavix and a daily 81 mg Asprin.  He has had 5 vessel by pass coronary artery surgery March 2017.   Hospital Course:  The patient was admitted to the hospital and taken to the operating room on 08/09/2016 and underwent: 1.  Right re-do femoral artery exposure 2.  Right ilio-femoral endarterectomy 3.  Bovine pericardial patch angioplasty right common femoral  artery 4.  Re-do right limb of right to left femoral femoral bypass    The pt tolerated the procedure well and was transported to the PACU in good condition.   He had  + PT/AT doppler signal on the right; +PT/peroneal doppler signal on the left in the recovery room.  He did have acute surgical blood loss anemia and was transfused one unit of PRBC's.  By POD 1, he is doing well and continues to have good doppler signals in both feet.  His incision is clean and dry without hematoma.  His Plavix is restarted.  The remainder of the hospital course consisted of increasing mobilization and increasing intake of solids without difficulty.  CBC    Component Value Date/Time   WBC 7.8 08/10/2016 0442   RBC 3.26 (L) 08/10/2016 0442   HGB 9.1 (L) 08/10/2016 0442   HCT 27.3 (L) 08/10/2016 0442   PLT 312 08/10/2016 0442   MCV 83.7 08/10/2016 0442   MCH 27.9 08/10/2016 0442   MCHC 33.3 08/10/2016 0442   RDW 15.2 08/10/2016 0442   LYMPHSABS 1,827 04/25/2016 1038   MONOABS 957 (H) 04/25/2016 1038   EOSABS 522 (H) 04/25/2016 1038   BASOSABS 87 04/25/2016 1038    BMET    Component Value Date/Time   NA 132 (L) 08/10/2016 0442   K 4.8 08/10/2016 0442   CL 102 08/10/2016 0442   CO2 24 08/10/2016 0442   GLUCOSE 131 (H) 08/10/2016 0442   BUN 17 08/10/2016 0442   CREATININE 1.20 08/10/2016 0442   CREATININE 1.28 (H) 04/25/2016 1038   CALCIUM 7.7 (L) 08/10/2016 0442   GFRNONAA >60 08/10/2016  [place] >60 08/10/2016 0442     Discharge Instructions    Call MD for:  redness, tenderness, or signs of infection (pain, swelling, bleeding, redness, odor or green/yellow discharge around incision site)    Complete by:  As directed    Call MD for:  severe or increased pain, loss or decreased feeling  in affected limb(s)    Complete by:  As directed    Call MD for:  temperature >100.5    Complete by:  As directed    Discharge wound care:    Complete by:  As directed    Wash the groin wound with  soap and water daily and pat dry. (No tub bath-only shower)  Then put a dry gauze or washcloth there to keep this area dry daily and as needed.  Do not use Vaseline or neosporin on your incisions.  Only use soap and water on your incisions and then protect and keep dry.   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 PAD (peripheral artery disease) ([place]) 08/09/2016  . Claudication ([place]) 05/08/2016  . HCAP (healthcare-associated pneumonia) 01/12/2016  . Hyponatremia 01/12/2016  . Abnormal chest CT 10/13/2015  . Type 2 diabetes, HbA1c goal < 7% (HCC)   . Unstable angina ([place])   . Hypomagnesemia   . Healthcare-associated pneumonia 10/09/2015  . Type 2 diabetes mellitus ([place]) 10/09/2015  . ASCVD (arteriosclerotic cardiovascular disease) 10/09/2015  . CKD (chronic kidney disease) stage 2, GFR 60-89 ml/min 10/09/2015  . CAP (community acquired pneumonia) 07/09/2015  . Sepsis ([place]) 07/09/2015  . 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 PVD (peripheral vascular disease) ([place]) 07/09/2015  . Essential hypertension 07/09/2015  . Hypotension 07/09/2015  . Neuropathy, peripheral ([place]) 07/09/2015  . HLD (hyperlipidemia) 07/09/2015  . AKI (acute kidney injury) ([place]) 07/09/2015   Past Medical History:  Diagnosis Date  . CAD S/P percutaneous coronary angioplasty 1998   PCI TO CX  . Diabetes mellitus without complication ([place])   . GERD (gastroesophageal reflux disease)   . 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 Hypercholesteremia   . Hypertension   . Neuropathy ([place])   . Peripheral vascular disease ([place])    s/p R-L  FEM-FEM BYPASS  . Tachycardia    after CABG, pt on medicine for this     Allergies as of 08/10/2016      Reactions   No Known Allergies       Medication List    TAKE these medications   aspirin 81 MG tablet Take 81 mg by mouth daily.   clopidogrel 75 MG tablet Commonly known as:  PLAVIX Take 75 mg by mouth daily with breakfast.   ferrous sulfate 325 (65 FE) MG tablet Take 325-650 mg by mouth 2 (two) times daily with a meal. 650mg  in the morning and 325mg  in the evening   gabapentin 300 MG capsule Commonly known as:  NEURONTIN Take 300 mg by mouth 3 (three) times daily.   glipiZIDE 5 MG  tablet Commonly known as:  GLUCOTROL Take 10 mg by mouth 2 (two) times daily before a meal.   lisinopril 5 MG tablet Commonly known as:  PRINIVIL,ZESTRIL Take 5 mg by mouth daily.   metFORMIN 1000 MG tablet Commonly known as:  GLUCOPHAGE Take 2,000 mg by mouth 2 (two) times daily with a meal.   metoprolol 50 MG tablet Commonly known as:  LOPRESSOR Take 1 tablet (50 mg total) by mouth 2 (two) times daily.   oxyCODONE-acetaminophen 5-325 MG tablet Commonly known as:  PERCOCET/ROXICET Take 1 tablet by mouth every 6 (six) hours as needed for moderate pain.   pantoprazole 40 MG tablet Commonly known as:  PROTONIX Take 40 mg by mouth daily.   simvastatin 40 MG tablet Commonly known as:  ZOCOR Take 20 mg by mouth at bedtime.   Vitamin D 2000 units tablet Take 2,000 Units by mouth daily.       Prescriptions given: 1.  Roxicet #20 No Refill  Instructions: 1.  Wash the groin wound with soap and water daily and pat dry. (No tub bath-only shower)  Then put a dry gauze or washcloth there to keep this area dry daily and as needed.  Do not use Vaseline or neosporin on your incisions.  Only use soap and water on your incisions and then protect and keep dry. 2.  No driving x 2 weeks & while taking pain medication 3.  No heavy lifting x 4 weeks. 4.  Shower daily starting  08/11/16  Disposition: home  Patient's condition: is Good  Follow up: 1. Dr. Trula Slade in 2 weeks   Leontine Locket, PA-C [facility] [phone] 08/10/2016  7:57 AM  - For VQI Registry use ---   Post-op:  Wound infection: No  Graft infection: No  Transfusion: Yes    If yes, 1 unit given New Arrhythmia: No Ipsilateral amputation: No, [ ]  Minor, [ ]  BKA, [ ]  AKA Discharge patency: [ ]  Primary, [ ]  Primary assisted, [ ]  Secondary, [ ]  Occluded Patency judged by: [x ] Dopper only, [ ]  Palpable graft pulse, []  Palpable distal pulse, [ ]  ABI inc. > 0.15, [ ]  Duplex Discharge ABI: R not done, L  D/C Ambulatory Status: Ambulatory  Complications: MI: No, [ ]  Troponin only, [ ]  EKG or Clinical CHF: No Resp failure:No, [ ]  Pneumonia, [ ]  Ventilator Chg in renal function: No, [ ]  Inc. Cr > 0.5, [ ]  Temp. Dialysis,  [ ]  Permanent dialysis Stroke: No, [ ]  Minor, [ ]  Major Return to OR: No  Reason for return to OR: [ ]  Bleeding, [ ]  Infection, [ ]  Thrombosis, [ ]  Revision  Discharge medications: Statin use:  yes ASA use:  yes Plavix use:  yes Beta blocker use: yes CCB use:  No ACEI use:   yes ARB use:  no Coumadin use: no   I agree with the above.  I have seen and evaluated the patient.  His incision is healing nicely.  He has ambulated and voided without difficulty.  He would like to go home.  I'll follow-up with me in 2 weeks.  [name]

## 2016-08-10 NOTE — Progress Notes (Signed)
PT Cancellation Note  Patient Details Name: [name] MRN: [id number] DOB: Nov 22, 1945   Cancelled Treatment:    Reason Eval/Treat Not Completed: PT screened, no needs identified, will sign off Spoke with RN and Rn reports pt has been up ambulating without difficulty. Discharge orders in. Spoke with pt and his wife and they report no needs and wife will be able to assist at d/c. Declined needing PT eval. Will sign off. Thanks.   Marguarite Arbour A Maeson Lourenco 08/10/2016, 9:12 AM Wray Kearns, [place], DPT [phone]

## 2016-08-21 NOTE — Anesthesia Postprocedure Evaluation (Signed)
Anesthesia Post Note  Patient: [name]  Procedure(s) Performed: Procedure(s) (LRB): REVISION BYPASS GRAFT RIGHT FEMORAL-LEFT FEMORAL ARTERY (Bilateral) ENDARTERECTOMY FEMORAL WITH VEIN PATCH ANGIOPLASTY (Right)  Patient location during evaluation: PACU Anesthesia Type: General Level of consciousness: awake and alert Pain management: pain level controlled Vital Signs Assessment: post-procedure vital signs reviewed and stable Respiratory status: spontaneous breathing, nonlabored ventilation, respiratory function stable and patient connected to nasal cannula oxygen Cardiovascular status: blood pressure returned to baseline and stable Postop Assessment: no signs of nausea or vomiting Anesthetic complications: no       Last Vitals:  Vitals:   08/10/16 0700 08/10/16 1100  BP: (!) 94/51 (!) 152/68  Pulse: 73 92  Resp: 14 10  Temp: 36.6 C 37.2 C    Last Pain:  Vitals:   08/10/16 1100  TempSrc: Oral  PainSc:                  Roberts Bon,JAMES TERRILL

## 2016-08-29 ENCOUNTER — Encounter: Payer: Self-pay | Admitting: Surgery

## 2016-09-04 ENCOUNTER — Encounter: Payer: Self-pay | Admitting: Surgery

## 2016-09-04 ENCOUNTER — Ambulatory Visit (INDEPENDENT_AMBULATORY_CARE_PROVIDER_SITE_OTHER): Payer: Medicare Other | Admitting: Surgery

## 2016-09-04 ENCOUNTER — Ambulatory Visit (HOSPITAL_COMMUNITY)
Admit: 2016-09-04 | Discharge: 2016-09-04 | Disposition: A | Discharge status: Home / Self Care | Payer: Medicare Other | Attending: Surgery | Admitting: Surgery

## 2016-09-04 VITALS — BP 118/67 | HR 66 | Temp 97.8°F | Ht 67.0 in | Wt 163.0 lb

## 2016-09-04 DIAGNOSIS — I70213 Atherosclerosis of native arteries of extremities with intermittent claudication, bilateral legs: Secondary | ICD-10-CM

## 2016-09-04 DIAGNOSIS — Z48812 Encounter for surgical aftercare following surgery on the circulatory system: Secondary | ICD-10-CM

## 2016-09-04 DIAGNOSIS — I70201 Unspecified atherosclerosis of native arteries of extremities, right leg: Secondary | ICD-10-CM | POA: Diagnosis not present

## 2016-09-04 DIAGNOSIS — I739 Peripheral vascular disease, unspecified: Secondary | ICD-10-CM | POA: Diagnosis not present

## 2016-09-04 NOTE — Progress Notes (Signed)
Patient name: [name] MRN: [id number] DOB: 07/29/1946 Sex: male  REASON FOR VISIT:     Postop  HISTORY OF PRESENT ILLNESS:   [name] is a 71 y.o. male who  has a history of a right to left femoral-femoral bypass graft by Dr. Amedeo Plenty in the remote past.  He has also undergone left femoral popliteal bypass grafting and stenting to his right superficial femoral artery, both of which have occluded.  He has had progressive symptoms of claudication and recently underwent angiography by Dr. Alvester Chou which revealed significant right common femoral and distal external iliac stenosis, near the arterial anastomosis of the femorofemoral bypass graft.  On 08/09/2016 he underwent right iliofemoral endarterectomy with patch and plasty and reimplanting of the femoral-femoral graft.  His postoperative course was uncomplicated.  He did well at home for the first 2 days and then began having a return of symptoms.  This consisted of claudication with minimal ambulation.  CURRENT MEDICATIONS:    Current Outpatient Prescriptions  Medication Sig Dispense Refill  . aspirin 81 MG tablet Take 81 mg by mouth daily.    . Cholecalciferol (VITAMIN D) 2000 UNITS tablet Take 2,000 Units by mouth daily.    . clopidogrel (PLAVIX) 75 MG tablet Take 75 mg by mouth daily with breakfast.    . ferrous sulfate 325 (65 FE) MG tablet Take 325-650 mg by mouth 2 (two) times daily with a meal. 650mg  in the morning and 325mg  in the evening     . gabapentin (NEURONTIN) 300 MG capsule Take 300 mg by mouth 3 (three) times daily.     [name] glipiZIDE (GLUCOTROL) 5 MG tablet Take 10 mg by mouth 2 (two) times daily before a meal.     . lisinopril (PRINIVIL,ZESTRIL) 5 MG tablet Take 5 mg by mouth daily.     . metFORMIN (GLUCOPHAGE) 1000 MG tablet Take 2,000 mg by mouth 2 (two) times daily with a meal.     . metoprolol (LOPRESSOR) 50 MG tablet Take 1 tablet (50 mg total) by mouth 2 (two) times daily. 180 tablet  3  . pantoprazole (PROTONIX) 40 MG tablet Take 40 mg by mouth daily.    . simvastatin (ZOCOR) 40 MG tablet Take 20 mg by mouth at bedtime.     [name] oxyCODONE-acetaminophen (PERCOCET/ROXICET) 5-325 MG tablet Take 1 tablet by mouth every 6 (six) hours as needed for moderate pain. (Patient not taking: Reported on 09/04/2016) 20 tablet 0   No current facility-administered medications for this visit.     REVIEW OF SYSTEMS:   [X]  denotes positive finding, [ ]  denotes negative finding Cardiac  Comments:  Chest pain or chest pressure:    Shortness of breath upon exertion:    Short of breath when lying flat:    Irregular heart rhythm:    Constitutional    Fever or chills:      PHYSICAL EXAM:   Vitals:   09/04/16 1529  BP: 118/67  Pulse: 66  Temp: 97.8 F (36.6 C)  TempSrc: Oral  SpO2: 97%  Weight: 163 lb (73.9 kg)  Height: 5\' 7"  (1.702 m)    GENERAL: The patient is a well-nourished male, in no acute distress. The vital signs are documented above. CARDIOVASCULAR: There is a regular rate and rhythm. PULMONARY: Non-labored respirations Palpable pulse within the femoral-femoral bypass graft.  The right femoral incision is healing nicely with no evidence of infection  STUDIES:   Ultrasound today shows ABI 0.56 on the right and  0.79 on the left.  These are decreased from his preoperative study   MEDICAL ISSUES:   The patient's symptoms have not improved after his surgery and in fact his ABIs have decreased.  Unfortunately, he still has a palpable pulse within his bypass graft, therefore I think he needs to undergo further evaluation with arteriogram to evaluate the repair.  This will be done through a left brachial access.  If anything needs to be treated it would be done at that time.  This is been scheduled for Tuesday February 6  [name], MD [facility] [phone] Pager [phone]

## 2016-09-08 ENCOUNTER — Encounter: Payer: Self-pay | Admitting: Cardiovascular Disease

## 2016-09-08 ENCOUNTER — Other Ambulatory Visit: Payer: Self-pay | Admitting: *Deleted

## 2016-09-08 ENCOUNTER — Ambulatory Visit (INDEPENDENT_AMBULATORY_CARE_PROVIDER_SITE_OTHER): Payer: Medicare Other | Admitting: Cardiovascular Disease

## 2016-09-08 VITALS — BP 92/60 | HR 72 | Ht 67.0 in | Wt 162.0 lb

## 2016-09-08 DIAGNOSIS — I10 Essential (primary) hypertension: Secondary | ICD-10-CM

## 2016-09-08 DIAGNOSIS — E7800 Pure hypercholesterolemia, unspecified: Secondary | ICD-10-CM

## 2016-09-08 DIAGNOSIS — Z951 Presence of aortocoronary bypass graft: Secondary | ICD-10-CM | POA: Diagnosis not present

## 2016-09-08 DIAGNOSIS — I739 Peripheral vascular disease, unspecified: Secondary | ICD-10-CM

## 2016-09-08 NOTE — Patient Instructions (Signed)

## 2016-09-08 NOTE — Assessment & Plan Note (Signed)
History of peripheral arterial disease status post right to left femorofemoral crossover grafting by Dr. Amedeo Plenty in the 90s as well as right common iliac artery stenting by myself. He has known occluded SFAs bilaterally. Because of worsening claudication and Doppler studies I performed angiography on him 05/08/16 revealing a patent femorofemoral crossover graft with a 95% calcified plaque just proximal to the anastomosis of the right to left femorofemoral crossover graft in the right common femoral artery. He underwent right common iliofemoral endarterectomy by Dr. Trula Slade 08/09/16. Unfortunately, his Dopplers have actually worsened and his symptoms have as well. He is scheduled for angiography to the left brachial approach sometime next month.

## 2016-09-08 NOTE — Assessment & Plan Note (Signed)
History of CAD status post coronary artery bypass grafting by Dr. Roxan Hockey 10/18/15 with a LIMA to his LAD, vein to diagonal branch, obtuse marginal branches 1 and 2 sequentially onto the distal RCA. His postoperative course was unremarkable. He denies chest pain or shortness of breath.

## 2016-09-08 NOTE — Assessment & Plan Note (Signed)
History of hyperlipidemia on statin therapy followed by [facility] in [address] his last lipid profile in our chart performed 10/11/15 revealed a total cholesterol 110, LDL 56 and HDL 27.

## 2016-09-08 NOTE — Progress Notes (Signed)
09/08/2016 [name]   1946/03/27  [id number]  Primary Physician [name], MD Primary Cardiologist: Lorretta Harp MD Renae Gloss  HPI:  Mr. Akers is a delightful 70 year old thin appearing married Caucasian male father of 2 children who I took care of for years and last saw in the office 06/07/16. Accompanied by his wife today. He has a history of CAD status post circumflex stenting by myself in 2003. He also has a history of peripheral vascular disease status post grafting of his lower extremities by Dr. Amedeo Plenty in the 90s. He had right to left femorofemoral crossover grafting. I performed stenting of his right lower extremity multiple times. He stopped smoking remotely. His cardiovascular risk factor profile includes treated hypertension, hyperlipidemia and type 2 diabetes. He was admitted in February with unstable angina and underwent cardiac catheterization by Dr. [place]ique 10/11/15 revealing three-vessel disease. He ultimately underwent coronary artery bypass grafting on 10/18/15 by Dr. Roxan Hockey is a LIMA to his LAD, vein to diagonal branch, obtuse marginal branches 1 and 2 and the distal RCA. His postop course was unremarkable. He recently had Doppler studies revealing an occluded left iliac with high-grade right external iliac artery stenosis, occluded SFAs bilaterally with occluded left femoropopliteal bypass graft and a patent right to left femorofemoral crossover graft.. His right ABI was 0.6 by the left was 0.88. He was more symptomatic on the right and does have lifestyle limiting claudication. He underwent peripheral angiography by myself 05/08/16 revealing a patent right to left femorofemoral crossover graft, patent right iliac stent, occluded SFAs bilaterally with a high-grade 95% calcified right common femoral stenosis just proximal to the femorofemoral anastomosis. He also underwent right iliofemoral endarterectomy by Dr. Trula Slade 08/09/16 however his postprocedure  Dopplers actually showed worsening of his claudication is worse as well. He is scheduled to have angiography to further investigate potential anatomic issue for the left brachial approach by Dr. Trula Slade in February..    Current Outpatient Prescriptions  Medication Sig Dispense Refill  . aspirin 81 MG tablet Take 81 mg by mouth daily.    . Cholecalciferol (VITAMIN D) 2000 UNITS tablet Take 2,000 Units by mouth daily.    . clopidogrel (PLAVIX) 75 MG tablet Take 75 mg by mouth daily with breakfast.    . ferrous sulfate 325 (65 FE) MG tablet Take 325-650 mg by mouth 2 (two) times daily with a meal. 650mg  in the morning and 325mg  in the evening     . gabapentin (NEURONTIN) 300 MG capsule Take 300 mg by mouth 3 (three) times daily.     [name] glipiZIDE (GLUCOTROL) 5 MG tablet Take 10 mg by mouth 2 (two) times daily before a meal.     . lisinopril (PRINIVIL,ZESTRIL) 5 MG tablet Take 5 mg by mouth daily.     . metFORMIN (GLUCOPHAGE) 1000 MG tablet Take 2,000 mg by mouth 2 (two) times daily with a meal.     . metoprolol (LOPRESSOR) 50 MG tablet Take 1 tablet (50 mg total) by mouth 2 (two) times daily. 180 tablet 3  . pantoprazole (PROTONIX) 40 MG tablet Take 40 mg by mouth daily.    . simvastatin (ZOCOR) 40 MG tablet Take 20 mg by mouth at bedtime.      No current facility-administered medications for this visit.     Allergies  Allergen Reactions  . No Known Allergies     Social History   Social History  . Marital status: Married    Spouse  name: N/A  . Number of children: N/A  . Years of education: N/A   Occupational History  . Not on file.   Social History Main Topics  . Smoking status: Former Smoker    Packs/day: 2.00    Years: 30.00    Types: Cigarettes    Quit date: 08/14/2000  . Smokeless tobacco: Never Used  . Alcohol use No  . Drug use: No  . Sexual activity: Not on file   Other Topics Concern  . Not on file   Social History Narrative  . No narrative on file     Review  of Systems: General: negative for chills, fever, night sweats or weight changes.  Cardiovascular: negative for chest pain, dyspnea on exertion, edema, orthopnea, palpitations, paroxysmal nocturnal dyspnea or shortness of breath Dermatological: negative for rash Respiratory: negative for cough or wheezing Urologic: negative for hematuria Abdominal: negative for nausea, vomiting, diarrhea, bright red blood per rectum, melena, or hematemesis Neurologic: negative for visual changes, syncope, or dizziness All other systems reviewed and are otherwise negative except as noted above.    Blood pressure 92/60, pulse 72, height 5\' 7"  (1.702 m), weight 162 lb (73.5 kg).  General appearance: alert and no distress Neck: no adenopathy, no carotid bruit, no JVD, supple, symmetrical, trachea midline and thyroid not enlarged, symmetric, no tenderness/mass/nodules Lungs: clear to auscultation bilaterally Heart: regular rate and rhythm, S1, S2 normal, no murmur, click, rub or gallop Extremities: extremities normal, atraumatic, no cyanosis or edema and Well-healed incision right groin with an audible bruit.  EKG normal sinus rhythm at 72 with nonspecific ST and T-wave changes. 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 Roxan Hockey 10/18/15 with a LIMA to his LAD, vein to diagonal branch, obtuse marginal branches 1 and 2 sequentially onto the distal RCA. His postoperative course was unremarkable. He denies chest pain or shortness of breath.  PVD (peripheral vascular disease) ([place]) History of peripheral arterial disease status post right to left femorofemoral crossover grafting by Dr. Amedeo Plenty in the 90s as well as right common iliac artery stenting by myself. He has known occluded SFAs bilaterally. Because of worsening claudication and Doppler studies I performed angiography on him 05/08/16 revealing a patent  femorofemoral crossover graft with a 95% calcified plaque just proximal to the anastomosis of the right to left femorofemoral crossover graft in the right common femoral artery. He underwent right common iliofemoral endarterectomy by Dr. Trula Slade 08/09/16. Unfortunately, his Dopplers have actually worsened and his symptoms have as well. He is scheduled for angiography to the left brachial approach sometime next month.  Essential hypertension History of hypertension with blood pressure measured 92/60. He is on lisinopril and metoprolol. Continue current meds at current dosing  HLD (hyperlipidemia) History of hyperlipidemia on statin therapy followed by [facility] in [place] his last lipid profile in our chart performed 10/11/15 revealed a total cholesterol 110, LDL 56 and HDL 27.      Lorretta Harp MD [facility], [facility] 09/08/2016 10:32 AM

## 2016-09-08 NOTE — Assessment & Plan Note (Signed)
History of hypertension with blood pressure measured 92/60. He is on lisinopril and metoprolol. Continue current meds at current dosing

## 2016-09-19 ENCOUNTER — Telehealth: Payer: Self-pay | Admitting: Surgery

## 2016-09-19 ENCOUNTER — Encounter (HOSPITAL_COMMUNITY)
Admission: RE | Disposition: A | Discharge status: Home / Self Care | Payer: Self-pay | Source: Ambulatory Visit | Attending: Surgery

## 2016-09-19 ENCOUNTER — Ambulatory Visit (HOSPITAL_COMMUNITY)
Admission: RE | Admit: 2016-09-19 | Discharge: 2016-09-19 | Disposition: A | Discharge status: Home / Self Care | Payer: Medicare Other | Source: Ambulatory Visit | Attending: Surgery | Admitting: Surgery

## 2016-09-19 DIAGNOSIS — Z7984 Long term (current) use of oral hypoglycemic drugs: Secondary | ICD-10-CM | POA: Diagnosis not present

## 2016-09-19 DIAGNOSIS — Z7982 Long term (current) use of aspirin: Secondary | ICD-10-CM | POA: Insufficient documentation

## 2016-09-19 DIAGNOSIS — I70213 Atherosclerosis of native arteries of extremities with intermittent claudication, bilateral legs: Secondary | ICD-10-CM | POA: Insufficient documentation

## 2016-09-19 DIAGNOSIS — Z7902 Long term (current) use of antithrombotics/antiplatelets: Secondary | ICD-10-CM | POA: Diagnosis not present

## 2016-09-19 HISTORY — PX: PERIPHERAL VASCULAR INTERVENTION: CATH118257

## 2016-09-19 HISTORY — PX: ABDOMINAL AORTOGRAM: CATH118222

## 2016-09-19 LAB — POCT I-STAT, CHEM 8
BUN: 30 mg/dL — AB (ref 6–20)
CHLORIDE: 101 mmol/L (ref 101–111)
CREATININE: 1.6 mg/dL — AB (ref 0.61–1.24)
Calcium, Ion: 1.2 mmol/L (ref 1.15–1.40)
Glucose, Bld: 150 mg/dL — ABNORMAL HIGH (ref 65–99)
HEMATOCRIT: 32 % — AB (ref 39.0–52.0)
Hemoglobin: 10.9 g/dL — ABNORMAL LOW (ref 13.0–17.0)
POTASSIUM: 4.7 mmol/L (ref 3.5–5.1)
SODIUM: 135 mmol/L (ref 135–145)
TCO2: 25 mmol/L (ref 0–100)

## 2016-09-19 LAB — POCT ACTIVATED CLOTTING TIME
ACTIVATED CLOTTING TIME: 180 s
Activated Clotting Time: 208 seconds

## 2016-09-19 LAB — GLUCOSE, CAPILLARY: GLUCOSE-CAPILLARY: 91 mg/dL (ref 65–99)

## 2016-09-19 SURGERY — ABDOMINAL AORTOGRAM
Laterality: Right

## 2016-09-19 MED ORDER — OXYCODONE HCL 5 MG PO TABS: 5.0000 mg | ORAL_TABLET | ORAL | Status: DC | PRN

## 2016-09-19 MED ORDER — MORPHINE SULFATE (PF) 10 MG/ML IV SOLN: 2.0000 mg | INTRAVENOUS | Status: DC | PRN

## 2016-09-19 MED ORDER — SODIUM CHLORIDE 0.9 % IV SOLN
INTRAVENOUS | Status: DC
  Administered 2016-09-19: 11:00:00 via INTRAVENOUS

## 2016-09-19 MED ORDER — LIDOCAINE HCL (PF) 1 % IJ SOLN
INTRAMUSCULAR | Status: DC | PRN
  Administered 2016-09-19: 3 mL

## 2016-09-19 MED ORDER — FENTANYL CITRATE (PF) 100 MCG/2ML IJ SOLN
INTRAMUSCULAR | Status: DC | PRN
  Administered 2016-09-19: 25 ug via INTRAVENOUS
  Administered 2016-09-19: 50 ug via INTRAVENOUS
  Administered 2016-09-19: 25 ug via INTRAVENOUS

## 2016-09-19 MED ORDER — MIDAZOLAM HCL 2 MG/2ML IJ SOLN
INTRAMUSCULAR | Status: AC
  Filled 2016-09-19: qty 2

## 2016-09-19 MED ORDER — SODIUM CHLORIDE 0.9 % IV SOLN: 500.0000 mL | Freq: Once | INTRAVENOUS | Status: DC | PRN

## 2016-09-19 MED ORDER — LABETALOL HCL 5 MG/ML IV SOLN: 10.0000 mg | INTRAVENOUS | Status: DC | PRN

## 2016-09-19 MED ORDER — NITROGLYCERIN 1 MG/10 ML FOR IR/CATH LAB
INTRA_ARTERIAL | Status: DC | PRN
  Administered 2016-09-19: 200 ug

## 2016-09-19 MED ORDER — HEPARIN (PORCINE) IN NACL 2-0.9 UNIT/ML-% IJ SOLN
INTRAMUSCULAR | Status: AC
  Filled 2016-09-19: qty 1000

## 2016-09-19 MED ORDER — PHENOL 1.4 % MT LIQD: 1.0000 | OROMUCOSAL | Status: DC | PRN

## 2016-09-19 MED ORDER — METOPROLOL TARTRATE 5 MG/5ML IV SOLN: 2.0000 mg | INTRAVENOUS | Status: DC | PRN

## 2016-09-19 MED ORDER — ONDANSETRON HCL 4 MG/2ML IJ SOLN: 4.0000 mg | Freq: Four times a day (QID) | INTRAMUSCULAR | Status: DC | PRN

## 2016-09-19 MED ORDER — MIDAZOLAM HCL 2 MG/2ML IJ SOLN
INTRAMUSCULAR | Status: DC | PRN
  Administered 2016-09-19: 1 mg via INTRAVENOUS
  Administered 2016-09-19: 2 mg via INTRAVENOUS
  Administered 2016-09-19: 1 mg via INTRAVENOUS

## 2016-09-19 MED ORDER — LIDOCAINE HCL (PF) 1 % IJ SOLN
INTRAMUSCULAR | Status: AC
  Filled 2016-09-19: qty 30

## 2016-09-19 MED ORDER — ACETAMINOPHEN 325 MG PO TABS: 325.0000 mg | ORAL_TABLET | ORAL | Status: DC | PRN

## 2016-09-19 MED ORDER — NITROGLYCERIN 1 MG/10 ML FOR IR/CATH LAB
INTRA_ARTERIAL | Status: AC
  Filled 2016-09-19: qty 10

## 2016-09-19 MED ORDER — HEPARIN (PORCINE) IN NACL 2-0.9 UNIT/ML-% IJ SOLN
INTRAMUSCULAR | Status: DC | PRN
  Administered 2016-09-19: 1000 mL

## 2016-09-19 MED ORDER — HEPARIN SODIUM (PORCINE) 1000 UNIT/ML IJ SOLN
INTRAMUSCULAR | Status: DC | PRN
  Administered 2016-09-19: 5000 [IU] via INTRAVENOUS
  Administered 2016-09-19: 2000 [IU] via INTRAVENOUS

## 2016-09-19 MED ORDER — ACETAMINOPHEN 325 MG RE SUPP: 325.0000 mg | RECTAL | Status: DC | PRN

## 2016-09-19 MED ORDER — HYDRALAZINE HCL 20 MG/ML IJ SOLN: 5.0000 mg | INTRAMUSCULAR | Status: DC | PRN

## 2016-09-19 MED ORDER — HEPARIN SODIUM (PORCINE) 1000 UNIT/ML IJ SOLN
INTRAMUSCULAR | Status: AC
  Filled 2016-09-19: qty 1

## 2016-09-19 MED ORDER — IODIXANOL 320 MG/ML IV SOLN
INTRAVENOUS | Status: DC | PRN
  Administered 2016-09-19: 50 mL via INTRAVENOUS

## 2016-09-19 MED ORDER — GUAIFENESIN-DM 100-10 MG/5ML PO SYRP: 15.0000 mL | ORAL_SOLUTION | ORAL | Status: DC | PRN

## 2016-09-19 MED ORDER — ALUM & MAG HYDROXIDE-SIMETH 200-200-20 MG/5ML PO SUSP: 15.0000 mL | ORAL | Status: DC | PRN

## 2016-09-19 MED ORDER — SODIUM CHLORIDE 0.9 % IV SOLN
1.0000 mL/kg/h | INTRAVENOUS | Status: DC
Start: 2016-09-19 — End: 2016-09-19

## 2016-09-19 MED ORDER — DOCUSATE SODIUM 100 MG PO CAPS: 100.0000 mg | ORAL_CAPSULE | Freq: Every day | ORAL | Status: DC

## 2016-09-19 MED ORDER — FENTANYL CITRATE (PF) 100 MCG/2ML IJ SOLN
INTRAMUSCULAR | Status: AC
  Filled 2016-09-19: qty 2

## 2016-09-19 SURGICAL SUPPLY — 23 items
BALLN MUSTANG 6.0X40 135 (BALLOONS) ×4
BALLN MUSTANG 6.0X40 135CM (BALLOONS) ×1
BALLN MUSTANG 7X60X135 (BALLOONS) ×5
BALLOON MUSTANG 6.0X40 135 (BALLOONS) ×1 IMPLANT
BALLOON MUSTANG 7X60X135 (BALLOONS) ×1 IMPLANT
CATH ANGIO 5F PIGTAIL 100CM (CATHETERS) ×3 IMPLANT
COVER PRB 48X5XTLSCP FOLD TPE (BAG) ×2 IMPLANT
COVER PROBE 5X48 (BAG) ×10
DEVICE CONTINUOUS FLUSH (MISCELLANEOUS) ×3 IMPLANT
KIT ENCORE 26 ADVANTAGE (KITS) ×3 IMPLANT
KIT PV (KITS) ×5 IMPLANT
SHEATH PINNACLE 5F 10CM (SHEATH) ×3 IMPLANT
SHEATH PINNACLE 6F 10CM (SHEATH) ×3 IMPLANT
SHEATH SHUTTLE SELECT 6F (SHEATH) ×3 IMPLANT
SHIELD RADPAD SCOOP 12X17 (MISCELLANEOUS) ×3 IMPLANT
STENT INNOVA 8X60X130 (Permanent Stent) ×3 IMPLANT
SYR MEDRAD MARK V 150ML (SYRINGE) ×5 IMPLANT
TRANSDUCER W/STOPCOCK (MISCELLANEOUS) ×5 IMPLANT
TRAY PV CATH (CUSTOM PROCEDURE TRAY) ×5 IMPLANT
TUBING HIGH PRESSURE 120CM (CONNECTOR) ×3 IMPLANT
WIRE BENTSON .035X145CM (WIRE) ×3 IMPLANT
WIRE HI TORQ VERSACORE J 260CM (WIRE) ×3 IMPLANT
WIRE MINI STICK MAX (SHEATH) ×3 IMPLANT

## 2016-09-19 NOTE — Progress Notes (Signed)
44fr sheath aspirated and removed from left brachial artery. Manual pressure applied for 30 minutes. Site level 0. Radial pulse palpable. spoe2 100%  As measured in left thumb.  Tegaderm dressing applied, bedrest instructions given.   Bedrest begins at 15:15:00

## 2016-09-19 NOTE — Op Note (Signed)
Patient name: [name] MRN: [id number] DOB: January 31, 1946 Sex: male  09/19/2016 Pre-operative Diagnosis: Bilateral claudication Post-operative diagnosis:  Same Surgeon:  [name] Procedure Performed:  1.  Ultrasound-guided access, left brachial artery  2.  Distal abdominal aortogram with limited runoff (distal aorta, bilateral iliac and femoral)  3.  Angioplasty, right common femoral artery  4.  Stent, right external iliac artery  5.  Stent, right common femoral artery  6.  Conscious sedation (59 minutes)   Indications:  The patient has a history of an occluded left iliac artery.  He has undergone stenting of his right iliac artery and femoral-femoral bypass grafting in the remote past.  He recently developed a high-grade stenosis within the right groin and was taken to the operating room for femoral endarterectomy and revision of his femoral-femoral bypass graft.  Postoperatively, he noted no changes in his symptoms and his ABIs had decreased.  Therefore he comes in today for angiography with possible intervention  Procedure:  The patient was identified in the holding area and taken to room 8.  The patient was then placed supine on the table and prepped and draped in the usual sterile fashion.  A time out was called.  Conscious sedation was a minister with the use of IV fentanyl and Versed under continuous physician and nurse monitoring.  Heart rate, blood pressure, and oxygen saturation were continuously monitored.  Ultrasound was used to evaluate the left brachial artery.  It was patent .  A digital ultrasound image was acquired.  A micropuncture needle was used to access the left brachial artery under ultrasound guidance.  An 018 wire was advanced without resistance and a micropuncture sheath was placed.  The 018 wire was removed and a benson wire was placed.  The micropuncture sheath was exchanged for a 5 french sheath.  A pigtail catheter and woolly wire were used to gain access to  the descending thoracic aorta.  The pigtail catheter was advanced to the aortic bifurcation and angiography was performed with the catheter at this level and the femoral heads within the picture.  Findings:   Aortogram:  The distal infrarenal abdominal aorta is widely patent.  Stents are visualized within the right common and external iliac artery which are patent however in the distal external iliac artery just beyond the distal stent, there is a plaque which is creating approximately a 90% stenosis.  Luminal irregularity extends into the proximal common femoral artery.  The left iliac system is occluded.  The femoral-femoral bypass graft is patent without stenosis   Intervention:  After the above images were acquired, the decision was made to proceed with intervention.  A 6 French 90 cm sheath was advanced into the right iliac artery.  The woolly wire was easily advanced across the stenosis and positioned in the distal right common femoral artery.  I initially elected to perform a primary balloon angioplasty of the lesion which extended into the proximal common femoral artery, as I would have preferred not to stent this area.  I selected a 6 x 40 Mustang balloon and positioned this in the proximal common femoral and distal external iliac artery and perform balloon and plasty nominal pressure for 2 minutes.  Completion imaging did not reveal any significant improvement.  Therefore I elected to treat this artery as well as the external iliac artery with stenting.  I selected a INNOVA 8 x 60 self-expanding stent.  This was deployed in the proximal right common femoral artery and  extended up into the right external iliac artery.  It was molded to confirmation with a 7 mm balloon.  Completion imaging showed dramatically improved result with no residual stenosis and a widely patent femoral-femoral bypass graft.  This point in time, the catheters and wires were removed.  The long sheath was exchanged out for a short  6 [place] sheath.  The patient was taken the holding area for sheath pull once her coagulation profile corrects.  Impression:  #1  focal high-grade stenosis just beyond the distal right external iliac artery stent which is approximately 90%.  There was also luminal irregularity within the proximal common femoral artery.  Primary balloon angioplasty did not yield optimal result and therefore the right proximal common femoral and right external iliac artery were primarily stented using an 8 x 60 self-expanding stent.  This provided complete resolution of the stenosis.    Theotis Burrow, M.D. [facility] Office: [phone] Pager:  [phone]

## 2016-09-19 NOTE — Discharge Instructions (Signed)
NO METFORMIN/GLUCOPHAGE FOR 2 DAYS    [place] Introduction Refer to this sheet in the next few weeks. These instructions provide you with information about caring for yourself after your procedure. Your health care provider may also give you more specific instructions. Your treatment has been planned according to current medical practices, but problems sometimes occur. Call your health care provider if you have any problems or questions after your procedure. What can I expect after the procedure? After your procedure, it is typical to have the following:  Bruising at the BRACHIAL site that usually fades within 1-2 weeks.  Blood collecting in the tissue (hematoma) that may be painful to the touch. It should usually decrease in size and tenderness within 1-2 weeks. Follow these instructions at home:  Take medicines only as directed by your health care provider.  You may shower 24-48 hours after the procedure or as directed by your health care provider. Remove the bandage (dressing) and gently wash the site with plain soap and water. Pat the area dry with a clean towel. Do not rub the site, because this may cause bleeding.  Do not take baths, swim, or use a hot tub until your health care provider approves.  Check your insertion site every day for redness, swelling, or drainage.  Do not apply powder or lotion to the site.  Do not flex or bend the affected arm for 24 hours or as directed by your health care provider.  Do not push or pull heavy objects with the affected arm for 24 hours or as directed by your health care provider.  Do not lift over 10 lb (4.5 kg) for 5 days after your procedure or as directed by your health care provider.  Ask your health care provider when it is okay to:  Return to work or school.  Resume usual physical activities or sports.  Resume sexual activity.  Do not drive home if you are discharged the same day as the procedure. Have someone else  drive you.  You may drive 24 hours after the procedure unless otherwise instructed by your health care provider.  Do not operate machinery or power tools for 24 hours after the procedure.  If your procedure was done as an outpatient procedure, which means that you went home the same day as your procedure, a responsible adult should be with you for the first 24 hours after you arrive home.  Keep all follow-up visits as directed by your health care provider. This is important. Contact a health care provider if:  You have a fever.  You have chills.  You have increased bleeding from the  BRACHIAL site. Hold pressure on the site. Get help right away if:  You have unusual pain at the BRACHIAL site.  You have redness, warmth, or swelling at the BRACHIAL site.  You have drainage (other than a small amount of blood on the dressing) from the BRACHIAL site.  The BRACHIAL site is bleeding, and the bleeding does not stop after 30 minutes of holding steady pressure on the site.  Your arm or hand becomes pale, cool, tingly, or numb. This information is not intended to replace advice given to you by your health care provider. Make sure you discuss any questions you have with your health care provider. Document Released: 09/02/2010 Document Revised: 01/06/2016 Document Reviewed: 02/16/2014  2017 Elsevier

## 2016-09-19 NOTE — H&P (View-Only) (Signed)
Patient name: [name] MRN: [id number] DOB: 1946-05-12 Sex: male  REASON FOR VISIT:     Postop  HISTORY OF PRESENT ILLNESS:   [name] is a 71 y.o. male who  has a history of a right to left femoral-femoral bypass graft by Dr. Amedeo Plenty in the remote past.  He has also undergone left femoral popliteal bypass grafting and stenting to his right superficial femoral artery, both of which have occluded.  He has had progressive symptoms of claudication and recently underwent angiography by Dr. Alvester Chou which revealed significant right common femoral and distal external iliac stenosis, near the arterial anastomosis of the femorofemoral bypass graft.  On 08/09/2016 he underwent right iliofemoral endarterectomy with patch and plasty and reimplanting of the femoral-femoral graft.  His postoperative course was uncomplicated.  He did well at home for the first 2 days and then began having a return of symptoms.  This consisted of claudication with minimal ambulation.  CURRENT MEDICATIONS:    Current Outpatient Prescriptions  Medication Sig Dispense Refill  . aspirin 81 MG tablet Take 81 mg by mouth daily.    . Cholecalciferol (VITAMIN D) 2000 UNITS tablet Take 2,000 Units by mouth daily.    . clopidogrel (PLAVIX) 75 MG tablet Take 75 mg by mouth daily with breakfast.    . ferrous sulfate 325 (65 FE) MG tablet Take 325-650 mg by mouth 2 (two) times daily with a meal. 650mg  in the morning and 325mg  in the evening     . gabapentin (NEURONTIN) 300 MG capsule Take 300 mg by mouth 3 (three) times daily.     [name] glipiZIDE (GLUCOTROL) 5 MG tablet Take 10 mg by mouth 2 (two) times daily before a meal.     . lisinopril (PRINIVIL,ZESTRIL) 5 MG tablet Take 5 mg by mouth daily.     . metFORMIN (GLUCOPHAGE) 1000 MG tablet Take 2,000 mg by mouth 2 (two) times daily with a meal.     . metoprolol (LOPRESSOR) 50 MG tablet Take 1 tablet (50 mg total) by mouth 2 (two) times daily. 180 tablet  3  . pantoprazole (PROTONIX) 40 MG tablet Take 40 mg by mouth daily.    . simvastatin (ZOCOR) 40 MG tablet Take 20 mg by mouth at bedtime.     [name] oxyCODONE-acetaminophen (PERCOCET/ROXICET) 5-325 MG tablet Take 1 tablet by mouth every 6 (six) hours as needed for moderate pain. (Patient not taking: Reported on 09/04/2016) 20 tablet 0   No current facility-administered medications for this visit.     REVIEW OF SYSTEMS:   [X]  denotes positive finding, [ ]  denotes negative finding Cardiac  Comments:  Chest pain or chest pressure:    Shortness of breath upon exertion:    Short of breath when lying flat:    Irregular heart rhythm:    Constitutional    Fever or chills:      PHYSICAL EXAM:   Vitals:   09/04/16 1529  BP: 118/67  Pulse: 66  Temp: 97.8 F (36.6 C)  TempSrc: Oral  SpO2: 97%  Weight: 163 lb (73.9 kg)  Height: 5\' 7"  (1.702 m)    GENERAL: The patient is a well-nourished male, in no acute distress. The vital signs are documented above. CARDIOVASCULAR: There is a regular rate and rhythm. PULMONARY: Non-labored respirations Palpable pulse within the femoral-femoral bypass graft.  The right femoral incision is healing nicely with no evidence of infection  STUDIES:   Ultrasound today shows ABI 0.56 on the right and  0.79 on the left.  These are decreased from his preoperative study   MEDICAL ISSUES:   The patient's symptoms have not improved after his surgery and in fact his ABIs have decreased.  Unfortunately, he still has a palpable pulse within his bypass graft, therefore I think he needs to undergo further evaluation with arteriogram to evaluate the repair.  This will be done through a left brachial access.  If anything needs to be treated it would be done at that time.  This is been scheduled for Tuesday February 6  [name], MD [facility] [phone] Pager [phone]

## 2016-09-19 NOTE — Telephone Encounter (Signed)
-----   Message from Mena Goes, RN sent at 09/19/2016  2:58 PM EST ----- Regarding: schedule   ----- Message ----- From: Serafina Mitchell, MD Sent: 09/19/2016   2:06 PM To: [facility] Charge Pool  09/19/2016:  Surgeon:  [name] Procedure Performed:  1.  Ultrasound-guided access, left brachial artery  2.  Distal abdominal aortogram with limited runoff (distal aorta, bilateral iliac and femoral)  3.  Angioplasty, right common femoral artery  4.  Stent, right external iliac artery  5.  Stent, right common femoral artery  6.  Conscious sedation (59 minutes)   Follow-up one month with ABIs.  To see me

## 2016-09-19 NOTE — Telephone Encounter (Signed)
Sched appt 10/23/16; lab at 3:30 and MD at 4:00. Spoke to pt's wife to inform them of appt.

## 2016-09-19 NOTE — Interval H&P Note (Signed)
History and Physical Interval Note:  09/19/2016 11:22 AM  [name]  has presented today for surgery, with the diagnosis of pvd with ulcer  The various methods of treatment have been discussed with the patient and family. After consideration of risks, benefits and other options for treatment, the patient has consented to  Procedure(s): Abdominal Aortogram w/Lower Extremity (N/A) as a surgical intervention .  The patient's history has been reviewed, patient examined, no change in status, stable for surgery.  I have reviewed the patient's chart and labs.  Questions were answered to the patient's satisfaction.     [name]

## 2016-09-20 ENCOUNTER — Encounter (HOSPITAL_COMMUNITY): Payer: Self-pay | Admitting: Surgery

## 2016-10-13 ENCOUNTER — Encounter: Payer: Self-pay | Admitting: Surgery

## 2016-10-16 NOTE — Addendum Note (Signed)
Addended by: Lianne Cure A on: 10/16/2016 04:41 PM   Modules accepted: Orders

## 2016-10-19 ENCOUNTER — Other Ambulatory Visit: Payer: Self-pay | Admitting: Thoracic Surgery (Cardiothoracic Vascular Surgery)

## 2016-10-19 DIAGNOSIS — R918 Other nonspecific abnormal finding of lung field: Secondary | ICD-10-CM

## 2016-10-23 ENCOUNTER — Ambulatory Visit: Payer: Medicare Other | Admitting: Surgery

## 2016-10-23 ENCOUNTER — Encounter (HOSPITAL_COMMUNITY): Payer: Medicare Other

## 2016-10-25 ENCOUNTER — Ambulatory Visit (HOSPITAL_COMMUNITY)
Admission: RE | Admit: 2016-10-25 | Discharge: 2016-10-25 | Disposition: A | Discharge status: Home / Self Care | Payer: Medicare Other | Source: Ambulatory Visit | Attending: Surgery | Admitting: Surgery

## 2016-10-25 DIAGNOSIS — I70213 Atherosclerosis of native arteries of extremities with intermittent claudication, bilateral legs: Secondary | ICD-10-CM

## 2016-10-27 ENCOUNTER — Encounter: Payer: Self-pay | Admitting: Surgery

## 2016-10-30 ENCOUNTER — Ambulatory Visit (INDEPENDENT_AMBULATORY_CARE_PROVIDER_SITE_OTHER): Payer: Self-pay | Admitting: Surgery

## 2016-10-30 ENCOUNTER — Encounter: Payer: Self-pay | Admitting: Surgery

## 2016-10-30 VITALS — BP 112/66 | HR 76 | Temp 98.4°F | Resp 18 | Ht 67.0 in | Wt 164.6 lb

## 2016-10-30 DIAGNOSIS — I70213 Atherosclerosis of native arteries of extremities with intermittent claudication, bilateral legs: Secondary | ICD-10-CM

## 2016-10-30 DIAGNOSIS — I739 Peripheral vascular disease, unspecified: Secondary | ICD-10-CM

## 2016-10-30 NOTE — Progress Notes (Signed)
Patient name: [name] MRN: [id number] DOB: Feb 24, 1946 Sex: male  REASON FOR VISIT:     follow up  HISTORY OF PRESENT ILLNESS:   [name] is a 71 y.o. male who has a history of a right to left femoral-femoral bypass graft by Dr. Amedeo Plenty in the remote past.  He is also status post left femoral-popliteal bypass grafting and stenting to his right superficial femoral artery, both of which have occluded.  He developed progressive symptoms.  On angiography he was found to have a high-grade right common femoral stenosis near the anastomosis of the aortobifemoral bypass graft.  Therefore on 08/09/2016 he underwent right iliofemoral endarterectomy with patch angioplasty.  The femoral-femoral graft was reimplanted onto the patch.  The patient did well initially but quickly developed recurrent symptoms.  His ABIs actually decreased.  And therefore on February 6 he underwent angiography.  This identified a high-grade stenosis within the right external iliac and common femoral artery just proximal to the repair.  This was initially treated with balloon angioplasty which did not yield an optimal result and therefore the proximal common femoral and right external iliac artery were primarily stented using an 8 x 60 self-expanding stent.  The patient states that his leg symptoms are improved.  He is pleased with how far he can walk now.  He does not have any open wounds.  CURRENT MEDICATIONS:    Current Outpatient Prescriptions  Medication Sig Dispense Refill  . aspirin 81 MG tablet Take 81 mg by mouth daily.    . Cholecalciferol (VITAMIN D) 2000 UNITS tablet Take 2,000 Units by mouth daily.    . clopidogrel (PLAVIX) 75 MG tablet Take 75 mg by mouth daily with breakfast.    . ferrous sulfate 325 (65 FE) MG tablet Take 325-650 mg by mouth 2 (two) times daily with a meal. 650mg  in the morning and 325mg  in the evening     . gabapentin (NEURONTIN) 300 MG capsule Take 300 mg  by mouth 3 (three) times daily.     [name] glipiZIDE (GLUCOTROL) 5 MG tablet Take 10 mg by mouth 2 (two) times daily before a meal.     . lisinopril (PRINIVIL,ZESTRIL) 5 MG tablet Take 5 mg by mouth daily.     . metFORMIN (GLUCOPHAGE) 1000 MG tablet Take 2,000 mg by mouth 2 (two) times daily with a meal.     . metoprolol (LOPRESSOR) 50 MG tablet Take 1 tablet (50 mg total) by mouth 2 (two) times daily. 180 tablet 3  . pantoprazole (PROTONIX) 40 MG tablet Take 40 mg by mouth daily.    . simvastatin (ZOCOR) 40 MG tablet Take 20 mg by mouth at bedtime.      No current facility-administered medications for this visit.     REVIEW OF SYSTEMS:   [X]  denotes positive finding, [ ]  denotes negative finding Cardiac  Comments:  Chest pain or chest pressure:    Shortness of breath upon exertion:    Short of breath when lying flat:    Irregular heart rhythm:    Constitutional    Fever or chills:      PHYSICAL EXAM:   Vitals:   10/30/16 0845  BP: 112/66  Pulse: 76  Resp: 18  Temp: 98.4 F (36.9 C)  TempSrc: Oral  SpO2: 97%  Weight: 164 lb 9.6 oz (74.7 kg)  Height: 5\' 7"  (1.702 m)    GENERAL: The patient is a well-nourished male, in no acute distress. The vital signs  are documented above. CARDIOVASCULAR: There is a regular rate and rhythm. PULMONARY: Non-labored respirations No ulcers bilaterally.  Nonpalpable pedal pulses  STUDIES:   ABIs are 0.62 on the right and 0.80 on the left.  Both monophasic.  Both slightly improved   MEDICAL ISSUES:   Status post right iliofemoral endarterectomy with patch angioplasty and reimplantation of the right limb of a right to left femoral-femoral bypass graft.  Following his procedure he underwent angioplasty and stenting of his right common femoral and external iliac artery.  The patient is doing well at this time.  His symptoms are improved.  I discussed with him that he will continue to have claudication at certain distances, as he has bilateral  superficial femoral artery occlusion.  I discussed the importance of exercise.  We will contemplate getting him enrolled in a supervised exercise program once the programs are up and running.  Have him scheduled to follow-up with me in 6 months.  I suspect he will get his ultrasound follow-ups with Dr. Gwenlyn Found, if not he will get studies just before seeing me.  [name], MD [facility] [phone] Pager [phone]

## 2016-10-31 NOTE — Addendum Note (Signed)
Addended by: Lianne Cure A on: 10/31/2016 10:07 AM   Modules accepted: Orders

## 2016-12-01 ENCOUNTER — Other Ambulatory Visit: Payer: Self-pay | Admitting: Thoracic Surgery (Cardiothoracic Vascular Surgery)

## 2016-12-01 DIAGNOSIS — R918 Other nonspecific abnormal finding of lung field: Secondary | ICD-10-CM

## 2016-12-05 ENCOUNTER — Ambulatory Visit
Admission: RE | Admit: 2016-12-05 | Discharge: 2016-12-05 | Disposition: A | Discharge status: Home / Self Care | Payer: Medicare Other | Source: Ambulatory Visit | Attending: Thoracic Surgery (Cardiothoracic Vascular Surgery) | Admitting: Thoracic Surgery (Cardiothoracic Vascular Surgery)

## 2016-12-05 ENCOUNTER — Encounter: Payer: Self-pay | Admitting: Thoracic Surgery (Cardiothoracic Vascular Surgery)

## 2016-12-05 ENCOUNTER — Ambulatory Visit (INDEPENDENT_AMBULATORY_CARE_PROVIDER_SITE_OTHER): Payer: Medicare Other | Admitting: Thoracic Surgery (Cardiothoracic Vascular Surgery)

## 2016-12-05 VITALS — BP 124/62 | HR 69 | Resp 16 | Ht 67.0 in | Wt 160.0 lb

## 2016-12-05 DIAGNOSIS — R918 Other nonspecific abnormal finding of lung field: Secondary | ICD-10-CM

## 2016-12-05 DIAGNOSIS — Z951 Presence of aortocoronary bypass graft: Secondary | ICD-10-CM

## 2016-12-05 DIAGNOSIS — R911 Solitary pulmonary nodule: Secondary | ICD-10-CM

## 2016-12-05 NOTE — Progress Notes (Signed)
[name] [address]       [name],[place] [address]             [phone]    HPI: [name] returns today for follow-up of a low-dose CT of the chest area  [name] is a 71 year old gentleman with a history of tobacco abuse, atherosclerotic cardiovascular disease, and chronic Kidney disease. I did CABG 5 on 10/18/2015. His postoperative course was uncomplicated but CT of the chest was done preoperatively had a cavitary lesion in the right upper lobe. In follow-up I did a PET/CT, which showed interval resolution. Because he met criteria for low dose lung cancer screening I recommended that he have that annually.  Over the past year he has not had any problems from a cardiac perspective. In December he had a right iliofemoral endarterectomy and revision of a right to left femoral-femoral bypass graft by Dr. Trula Slade. More recently he underwent an angioplasty. Past Medical History:  Diagnosis Date  . CAD S/P percutaneous coronary angioplasty 1998   PCI TO CX  . Diabetes mellitus without complication ([place])   . GERD (gastroesophageal reflux disease)   . 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 Hypercholesteremia   . Hypertension   . Neuropathy   . Peripheral vascular disease ([place])    s/p R-L FEM-FEM BYPASS  . Tachycardia    after CABG, pt on medicine for this     Current Outpatient Prescriptions  Medication Sig Dispense Refill  . aspirin 81 MG tablet Take 81 mg by mouth daily.    . Cholecalciferol (VITAMIN D) 2000 UNITS tablet Take 2,000 Units by mouth daily.    . clopidogrel (PLAVIX) 75 MG tablet Take 75 mg by mouth daily with breakfast.    . ferrous sulfate 325 (65 FE) MG tablet Take 325-650 mg by mouth 2 (two) times daily with a meal. 674m in the morning and 3226min the evening     . gabapentin (NEURONTIN) 300 MG capsule Take 300 mg by mouth 3 (three) times daily.     . Marland KitchenlipiZIDE (GLUCOTROL) 5 MG tablet Take 10 mg by mouth 2 (two) times daily before a meal.     . lisinopril  (PRINIVIL,ZESTRIL) 5 MG tablet Take 5 mg by mouth daily.     . metFORMIN (GLUCOPHAGE) 1000 MG tablet Take 2,000 mg by mouth 2 (two) times daily with a meal.     . metoprolol (LOPRESSOR) 50 MG tablet Take 1 tablet (50 mg total) by mouth 2 (two) times daily. 180 tablet 3  . pantoprazole (PROTONIX) 40 MG tablet Take 40 mg by mouth daily.    . simvastatin (ZOCOR) 40 MG tablet Take 20 mg by mouth at bedtime.      No current facility-administered medications for this visit.     Physical Exam BP 124/62 (BP Location: Right Arm, Patient Position: Sitting, Cuff Size: Large)   Pulse 69   Resp 16   Ht '5\' 7"'  (1.702 m)   Wt 160 lb (72.6 kg)   SpO2 99% Comment: ON RA  BMI 25.0646g/m  [age]ear old man in no acute distress Alert and oriented 3 with no focal deficits Sternal incision well-healed Cardiac regular rate and rhythm normal S1 and S2 Lungs clear bilaterally  Diagnostic Tests: CT CHEST WITHOUT CONTRAST  TECHNIQUE: Multidetector CT imaging of the chest was performed following the standard protocol without IV contrast.  COMPARISON:  11/30/2015 PET-CT.  10/13/2015 chest CT.  FINDINGS: Cardiovascular:  Normal heart size. No pericardial effusion. Normal caliber thoracic aorta and main pulmonary artery. Moderate calcific atherosclerosis of the thoracic aorta. Severe calcific atherosclerosis. Status post CABG with healed median sternotomy.  Mediastinum/Nodes: No enlarged mediastinal or axillary lymph nodes. Thyroid gland, trachea, and esophagus demonstrate no significant findings.  Lungs/Pleura: Small clusters of nodules in the right upper lobe and left upper lobe lingula. Scattered linear opacities are probably representing atelectasis or scarring. Right middle lobe triangular 3-4 mm nodules are stable and compatible with intrapulmonary lymph nodes.  Upper Abdomen: Bilateral nephrolithiasis. Right kidney interpolar cyst measuring 41 mm.  Musculoskeletal: No chest wall mass  or suspicious bone lesions identified.  IMPRESSION: 1. Small clusters of nodules in the upper lobes are compatible with bronchiolitis. 2. Few areas of linear scarring/atelectasis. 3. Stable 3-4 mm nodules within the right middle lobe, probably intrapulmonary lymph nodes. 4. Bilateral nephrolithiasis. 5. Severe coronary artery calcification post CABG. 6. Aortic calcific atherosclerosis.   Electronically Signed   By: Kristine Garbe M.D.   On: 12/05/2016 13:52 I personally reviewed the CT chest and concur with the findings noted above  Impression: 71 year old gentleman with past medical history significant for tobacco abuse, coronary artery disease, and the lung nodule. The initial concern with his lung scan from a cavitary mass that was seen on a CT scan prior to his coronary bypass grafting. We did an interval follow-up with PET CT in the nodule had resolved consistent with an infection. Because of that and his smoking history I recommended that we continue with annual CT screening.  He remains abstinent from tobacco.  His CT today showed some small scattered nodules but nothing of concern. He does need continued follow-up and will plan to repeat a CT in a year.  Plan: Return in one year with CT chest  [name], MD [facility] [phone]

## 2017-01-14 ENCOUNTER — Encounter (HOSPITAL_COMMUNITY): Payer: Self-pay | Admitting: Emergency Medicine

## 2017-01-14 ENCOUNTER — Emergency Department (HOSPITAL_COMMUNITY): Payer: Medicare Other

## 2017-01-14 ENCOUNTER — Emergency Department (HOSPITAL_COMMUNITY)
Admission: EM | Admit: 2017-01-14 | Discharge: 2017-01-14 | Disposition: A | Discharge status: Home / Self Care | Payer: Medicare Other | Attending: Emergency Medicine | Admitting: Emergency Medicine

## 2017-01-14 DIAGNOSIS — J189 Pneumonia, unspecified organism: Secondary | ICD-10-CM | POA: Insufficient documentation

## 2017-01-14 DIAGNOSIS — R0782 Intercostal pain: Secondary | ICD-10-CM | POA: Diagnosis present

## 2017-01-14 DIAGNOSIS — Z87891 Personal history of nicotine dependence: Secondary | ICD-10-CM | POA: Insufficient documentation

## 2017-01-14 DIAGNOSIS — Z951 Presence of aortocoronary bypass graft: Secondary | ICD-10-CM | POA: Insufficient documentation

## 2017-01-14 DIAGNOSIS — E119 Type 2 diabetes mellitus without complications: Secondary | ICD-10-CM | POA: Diagnosis not present

## 2017-01-14 DIAGNOSIS — I10 Essential (primary) hypertension: Secondary | ICD-10-CM | POA: Insufficient documentation

## 2017-01-14 DIAGNOSIS — Z79899 Other long term (current) drug therapy: Secondary | ICD-10-CM | POA: Diagnosis not present

## 2017-01-14 DIAGNOSIS — Z7984 Long term (current) use of oral hypoglycemic drugs: Secondary | ICD-10-CM | POA: Diagnosis not present

## 2017-01-14 DIAGNOSIS — Z7982 Long term (current) use of aspirin: Secondary | ICD-10-CM | POA: Insufficient documentation

## 2017-01-14 LAB — CBC WITH DIFFERENTIAL/PLATELET
BASOS PCT: 0 %
Basophils Absolute: 0 10*3/uL (ref 0.0–0.1)
EOS PCT: 0 %
Eosinophils Absolute: 0 10*3/uL (ref 0.0–0.7)
HEMATOCRIT: 33.1 % — AB (ref 39.0–52.0)
Hemoglobin: 10.8 g/dL — ABNORMAL LOW (ref 13.0–17.0)
LYMPHS PCT: 9 %
Lymphs Abs: 0.9 10*3/uL (ref 0.7–4.0)
MCH: 27.2 pg (ref 26.0–34.0)
MCHC: 32.6 g/dL (ref 30.0–36.0)
MCV: 83.4 fL (ref 78.0–100.0)
Monocytes Absolute: 0.9 10*3/uL (ref 0.1–1.0)
Monocytes Relative: 8 %
NEUTROS ABS: 9 10*3/uL — AB (ref 1.7–7.7)
Neutrophils Relative %: 83 %
PLATELETS: 428 10*3/uL — AB (ref 150–400)
RBC: 3.97 MIL/uL — AB (ref 4.22–5.81)
RDW: 15.3 % (ref 11.5–15.5)
WBC: 10.9 10*3/uL — AB (ref 4.0–10.5)

## 2017-01-14 LAB — BASIC METABOLIC PANEL
Anion gap: 10 (ref 5–15)
BUN: 44 mg/dL — AB (ref 6–20)
CALCIUM: 9.2 mg/dL (ref 8.9–10.3)
CHLORIDE: 103 mmol/L (ref 101–111)
CO2: 20 mmol/L — AB (ref 22–32)
CREATININE: 2.25 mg/dL — AB (ref 0.61–1.24)
GFR calc Af Amer: 32 mL/min — ABNORMAL LOW (ref >60)
GFR calc non Af Amer: 28 mL/min — ABNORMAL LOW (ref >60)
Glucose, Bld: 320 mg/dL — ABNORMAL HIGH (ref 65–99)
Potassium: 5.1 mmol/L (ref 3.5–5.1)
Sodium: 133 mmol/L — ABNORMAL LOW (ref 135–145)

## 2017-01-14 LAB — LACTIC ACID, PLASMA: Lactic Acid, Venous: 1.3 mmol/L (ref 0.5–1.9)

## 2017-01-14 MED ORDER — DEXTROSE 5 % IV SOLN
500.0000 mg | INTRAVENOUS | Status: DC
  Administered 2017-01-14: 500 mg via INTRAVENOUS
  Filled 2017-01-14: qty 500

## 2017-01-14 MED ORDER — AZITHROMYCIN 250 MG PO TABS: ORAL_TABLET | ORAL | 0 refills | Status: DC

## 2017-01-14 MED ORDER — DEXTROSE 5 % IV SOLN
1.0000 g | Freq: Once | INTRAVENOUS | Status: AC
  Administered 2017-01-14: 1 g via INTRAVENOUS
  Filled 2017-01-14: qty 10

## 2017-01-14 MED ORDER — SODIUM CHLORIDE 0.9 % IV BOLUS (SEPSIS)
500.0000 mL | Freq: Once | INTRAVENOUS | Status: AC
  Administered 2017-01-14: 500 mL via INTRAVENOUS

## 2017-01-14 NOTE — ED Provider Notes (Signed)
[facility] DEPT Provider Note   CSN: [id number] Arrival date & time: 01/14/17  1121     History   Chief Complaint Chief Complaint  Patient presents with  . Rib pain    HPI [name] is a 71 y.o. male.  Pain left lateral chest, low energy level, fatigue, chills for approximately 1 week. Past medical history includes pneumonia 3-4 episodes last year. No cigarette smoking.  Status post CABG years ago. No fever, rusty sputum. Severity is mild to moderate. Nothing makes symptoms better or worse.      Past Medical History:  Diagnosis Date  . CAD S/P percutaneous coronary angioplasty 1998   PCI TO CX  . Diabetes mellitus without complication ([place])   . GERD (gastroesophageal reflux disease)   . 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 Hypercholesteremia   . Hypertension   . Neuropathy   . Peripheral vascular disease ([place])    s/p R-L FEM-FEM BYPASS  . Tachycardia    after CABG, pt on medicine for this    Patient Active Problem List   Diagnosis Date Noted  . PAD (peripheral artery disease) ([place]) 08/09/2016  . Claudication ([place]) 05/08/2016  . HCAP (healthcare-associated pneumonia) 01/12/2016  . Hyponatremia 01/12/2016  . Abnormal chest CT 10/13/2015  . Type 2 diabetes, HbA1c goal < 7% ([facility])   . Unstable angina ([place])   . Hypomagnesemia   . Healthcare-associated pneumonia 10/09/2015  . Type 2 diabetes mellitus ([place]) 10/09/2015  . ASCVD (arteriosclerotic cardiovascular disease) 10/09/2015  . CKD (chronic kidney disease) stage 2, GFR 60-89 ml/min 10/09/2015  . CAP (community acquired pneumonia) 07/09/2015  . Sepsis ([place]) 07/09/2015  . 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 PVD (peripheral vascular disease) ([place]) 07/09/2015  . Essential hypertension 07/09/2015  . Hypotension 07/09/2015  . Neuropathy, peripheral 07/09/2015  . HLD (hyperlipidemia) 07/09/2015  . AKI (acute kidney injury) ([place]) 07/09/2015    Past Surgical History:  Procedure Laterality Date  .  ABDOMINAL AORTOGRAM Bilateral 09/19/2016   Procedure: iliac;  Surgeon: Serafina Mitchell, MD;  Location: [facility];  Service: Cardiovascular;  Laterality: Bilateral;  . CARDIAC CATHETERIZATION  2003   with stent  . CARDIAC CATHETERIZATION N/A 10/11/2015   Procedure: Left Heart Cath and Coronary Angiography;  Surgeon: Peter M Martinique, MD;  Location: [facility];  Service: Cardiovascular;  Laterality: N/A;  . COLONOSCOPY N/A 09/22/2013   Procedure: COLONOSCOPY;  Surgeon: Daneil Dolin, MD;  [facility];  Service: Endoscopy;  Laterality: N/A;  9:30 AM  . CORONARY ARTERY BYPASS GRAFT N/A 10/18/2015   Procedure: CORONARY ARTERY BYPASS GRAFTING (CABG) x  five, using left internal mammary artery and right leg greater saphenous vein harvested endoscopically;  Surgeon: [name], MD;  Location: [facility];  Service: Open Heart Surgery;  Laterality: N/A;  . ENDARTERECTOMY FEMORAL Right 08/09/2016   Procedure: ENDARTERECTOMY FEMORAL WITH VEIN PATCH ANGIOPLASTY;  Surgeon: Serafina Mitchell, MD;  Location: [facility];  Service: Vascular;  Laterality: Right;  . FEMORAL-FEMORAL BYPASS GRAFT Bilateral 08/09/2016   Procedure: REVISION BYPASS GRAFT RIGHT FEMORAL-LEFT FEMORAL ARTERY;  Surgeon: Serafina Mitchell, MD;  Location: [facility];  Service: Vascular;  Laterality: Bilateral;  . FEMORAL-POPLITEAL BYPASS GRAFT    . PERIPHERAL VASCULAR CATHETERIZATION N/A 05/08/2016   Procedure: Lower Extremity Angiography;  Surgeon: Lorretta Harp, MD;  Location: [facility];  Service: Cardiovascular;  Laterality: N/A;  . PERIPHERAL VASCULAR INTERVENTION  Right 09/19/2016   Procedure: Peripheral Vascular Intervention;  Surgeon: Serafina Mitchell, MD;  Location: [facility];  Service: Cardiovascular;  Laterality: Right;  ext iliac stent  . TEE WITHOUT CARDIOVERSION N/A 10/18/2015   Procedure: TRANSESOPHAGEAL ECHOCARDIOGRAM (TEE);  Surgeon: [name], MD;  Location: [facility];  Service: Open Heart  Surgery;  Laterality: N/A;       Home Medications    Prior to Admission medications   Medication Sig Start Date End Date Taking? Authorizing Provider  aspirin 81 MG tablet Take 81 mg by mouth daily.    [provider]  azithromycin (ZITHROMAX Z-PAK) 250 MG tablet As directed 01/14/17   Nat Christen, MD  Cholecalciferol (VITAMIN D) 2000 UNITS tablet Take 2,000 Units by mouth daily.    [provider]  clopidogrel (PLAVIX) 75 MG tablet Take 75 mg by mouth daily with breakfast.    [provider]  ferrous sulfate 325 (65 FE) MG tablet Take 325-650 mg by mouth 2 (two) times daily with a meal. 650mg  in the morning and 325mg  in the evening     [provider]  gabapentin (NEURONTIN) 300 MG capsule Take 300 mg by mouth 3 (three) times daily.     [provider]  glipiZIDE (GLUCOTROL) 5 MG tablet Take 10 mg by mouth 2 (two) times daily before a meal.     [provider]  lisinopril (PRINIVIL,ZESTRIL) 5 MG tablet Take 5 mg by mouth daily.  10/22/15   [provider]  metFORMIN (GLUCOPHAGE) 1000 MG tablet Take 2,000 mg by mouth 2 (two) times daily with a meal.     [provider]  metoprolol (LOPRESSOR) 50 MG tablet Take 1 tablet (50 mg total) by mouth 2 (two) times daily. 02/08/16   Lorretta Harp, MD  pantoprazole (PROTONIX) 40 MG tablet Take 40 mg by mouth daily.    [provider]  simvastatin (ZOCOR) 40 MG tablet Take 20 mg by mouth at bedtime.     [provider]    Family History Family History  Problem Relation Age of Onset  . Heart attack Mother   . Stroke Mother   . Colon cancer Neg Hx     Social History Social History  Substance Use Topics  . Smoking status: Former Smoker    Packs/day: 2.00    Years: 30.00    Types: Cigarettes    Quit date: 08/14/2000  . Smokeless tobacco: Never Used  . Alcohol use No     Allergies   No known allergies   Review of Systems Review of Systems  All  other systems reviewed and are negative.    Physical Exam Updated Vital Signs BP 132/61   Pulse 90   Temp 98.6 F (37 C) (Oral)   Resp 18   Ht 5\' 8"  (1.727 m)   Wt 71.2 kg (157 lb)   SpO2 96%   BMI 23.87 kg/m   Physical Exam  Constitutional: He is oriented to person, place, and time. He appears well-developed and well-nourished.  Well-hydrated, no acute distress  HENT:  Head: Normocephalic and atraumatic.  Eyes: Conjunctivae are normal.  Neck: Neck supple.  Cardiovascular: Normal rate and regular rhythm.   Pulmonary/Chest: Effort normal and breath sounds normal.  Abdominal: Soft. Bowel sounds are normal.  Musculoskeletal: Normal range of motion.  Neurological: He is alert and oriented to person, place, and time.  Skin: Skin is warm and dry.  Psychiatric: He has a normal mood  and affect. His behavior is normal.  Nursing note and vitals reviewed.    ED Treatments / Results  Labs (all labs ordered are listed, but only abnormal results are displayed) Labs Reviewed  CBC WITH DIFFERENTIAL/PLATELET - Abnormal; Notable for the following:       Result Value   WBC 10.9 (*)    RBC 3.97 (*)    Hemoglobin 10.8 (*)    HCT 33.1 (*)    Platelets 428 (*)    Neutro Abs 9.0 (*)    All other components within normal limits  BASIC METABOLIC PANEL - Abnormal; Notable for the following:    Sodium 133 (*)    CO2 20 (*)    Glucose, Bld 320 (*)    BUN 44 (*)    Creatinine, Ser 2.25 (*)    GFR calc non Af Amer 28 (*)    GFR calc Af Amer 32 (*)    All other components within normal limits  CULTURE, BLOOD (ROUTINE X 2)  LACTIC ACID, PLASMA    EKG  EKG Interpretation None       Radiology Dg Chest 2 View  Result Date: 01/14/2017 CLINICAL DATA:  Fever and left-sided rib pain. History of pneumonia, diabetes, hypertension, CABG and CAD. EXAM: CHEST  2 VIEW COMPARISON:  04/11/2016; chest CT - 12/05/2016 FINDINGS: Grossly unchanged borderline enlarged cardiac silhouette and  mediastinal contours post median sternotomy and CABG. Atherosclerotic plaque within the thoracic aorta. Interval development of ill-defined heterogeneous potential developing airspace opacities within the left mid lung. No pleural effusion or pneumothorax. No evidence of edema. No acute osseus abnormalities. IMPRESSION: 1. Findings worrisome for developing left mid lung pneumonia. A follow-up chest radiograph in 3 to 4 weeks after treatment is recommended to ensure resolution. 2.  Aortic Atherosclerosis (ICD10-I70.0). Electronically Signed   By: Sandi Mariscal M.D.   On: 01/14/2017 12:00    Procedures Procedures (including critical care time)  Medications Ordered in ED Medications  azithromycin (ZITHROMAX) 500 mg in dextrose 5 % 250 mL IVPB (500 mg Intravenous New Bag/Given 01/14/17 1445)  sodium chloride 0.9 % bolus 500 mL (500 mLs Intravenous New Bag/Given 01/14/17 1410)  cefTRIAXone (ROCEPHIN) 1 g in dextrose 5 % 50 mL IVPB (0 g Intravenous Stopped 01/14/17 1436)     Initial Impression / Assessment and Plan / ED Course  I have reviewed the triage vital signs and the nursing notes.  Pertinent labs & imaging results that were available during my care of the patient were reviewed by me and considered in my medical decision making (see chart for details).     Chest x-ray reveals a left midlung pneumonia. Patient is hemodynamically stable. IV Rocephin, IV Zithromax in the ED. Discharge medications Zithromax. This was discussed with the patient and his family.  Final Clinical Impressions(s) / ED Diagnoses   Final diagnoses:  Community acquired pneumonia of left lung, unspecified part of lung    New Prescriptions New Prescriptions   AZITHROMYCIN (ZITHROMAX Z-PAK) 250 MG TABLET    As directed     Nat Christen, MD 01/14/17 1512

## 2017-01-14 NOTE — Discharge Instructions (Signed)
Prescription for antibiotic. Increase fluids. Tylenol for fever. Follow-up your primary care doctor.

## 2017-01-14 NOTE — ED Triage Notes (Signed)
Patient c/o lower left side rib pain x3-4 days. Per patient tender to touch. Patient reports lifting boxes recently for move. Patient also reports fevers and cough. Denies cough being productive, denies any chest pain.  Per patient feels the same as when he had pneumonia.

## 2017-01-14 NOTE — ED Notes (Signed)
Pt c/o fever and left sided rib pain.  Pt has hx of PNA.

## 2017-01-14 NOTE — ED Notes (Signed)
Patient transported to X-ray 

## 2017-01-16 ENCOUNTER — Telehealth: Payer: Self-pay | Admitting: Cardiovascular Disease

## 2017-01-16 ENCOUNTER — Ambulatory Visit (INDEPENDENT_AMBULATORY_CARE_PROVIDER_SITE_OTHER): Payer: Medicare Other | Admitting: Physician Assistant

## 2017-01-16 ENCOUNTER — Encounter: Payer: Self-pay | Admitting: Physician Assistant

## 2017-01-16 VITALS — BP 90/50 | HR 67 | Ht 68.0 in | Wt 161.4 lb

## 2017-01-16 DIAGNOSIS — R002 Palpitations: Secondary | ICD-10-CM

## 2017-01-16 DIAGNOSIS — I25810 Atherosclerosis of coronary artery bypass graft(s) without angina pectoris: Secondary | ICD-10-CM | POA: Diagnosis not present

## 2017-01-16 DIAGNOSIS — I739 Peripheral vascular disease, unspecified: Secondary | ICD-10-CM

## 2017-01-16 DIAGNOSIS — E785 Hyperlipidemia, unspecified: Secondary | ICD-10-CM | POA: Diagnosis not present

## 2017-01-16 DIAGNOSIS — I10 Essential (primary) hypertension: Secondary | ICD-10-CM | POA: Diagnosis not present

## 2017-01-16 DIAGNOSIS — E119 Type 2 diabetes mellitus without complications: Secondary | ICD-10-CM

## 2017-01-16 MED ORDER — METOPROLOL TARTRATE 25 MG PO TABS: 25.0000 mg | ORAL_TABLET | Freq: Two times a day (BID) | ORAL | 3 refills | Status: DC

## 2017-01-16 NOTE — Telephone Encounter (Signed)
New message    Pt daughter is calling about pt.   Patient c/o Palpitations:  High priority if patient c/o lightheadedness and shortness of breath.  1. How long have you been having palpitations? Since last night   2. Are you currently experiencing lightheadedness and shortness of breath? No   3. Have you checked your BP and heart rate? (document readings) no  4. Are you experiencing any other symptoms? No

## 2017-01-16 NOTE — Patient Instructions (Signed)
Medication Instructions: Please stop taking your Lisinopril Your Metoprolol has been decreased to 25 mg tablet twice daily.   Follow-Up: Your physician recommends that you schedule a follow-up appointment in: 2 weeks with Almyra Deforest, PA   If you need a refill on your cardiac medications before your next appointment, please call your pharmacy.

## 2017-01-16 NOTE — Telephone Encounter (Signed)
Spoke with pt and dtr, the patient currently has pneumonia and is on antibiotics. He woke in the middle of the night with his heart racing and it has continued off and on this morning. He can feel fluttering in his chest and can hear it in his ears. They are not able to tell if it is irregular or not or the rate. He is breathing hard and feels bad. Follow up scheduled for him to see the PA this afternoon.

## 2017-01-16 NOTE — Progress Notes (Signed)
Cardiology Office Note    Date:  01/16/2017   ID:  IRWIN TORAN, DOB 04-21-1946, MRN [id number]  PCP:  [name], MD  Cardiologist:  Dr. Gwenlyn Found  Chief Complaint  Patient presents with  . Follow-up    seen for Dr. Gwenlyn Found, palpitation    History of Present Illness:  BONNER LARUE is a 71 y.o. male with PMH of HTN, HLD, PAD s/p R-L fem-fem bypass by Dr. Amedeo Plenty, DM II and CAD s/p CABG. He had a left circumflex stenting in 2003. Last cardiac catheterization on 10/11/2015 revealed three-vessel CAD, he ultimately underwent bypass graft on 10/18/2015 by Dr. Roxan Hockey with LIMA to LAD, SVG to diagonal, SVG to OM1-OM 2 and SVG to distal RCA. Due to abnormal ABI, he underwent peripheral angiography on 05/08/2016 revealing patent right-to-left femoral-femoral bypass graft, patent right iliac stent, occluded SFA bilaterally with high-grade 95% calcified lesion in right common femoral artery proximal to the femorofemoral and anastomosis. He underwent right iliofemoral atherectomy by Dr. Trula Slade on 08/09/2016, unfortunately his postprocedure Doppler x-ray showed worsening of the claudication. He eventually underwent stenting of the focal high-grade stenosis in the right external iliac artery by Dr. Trula Slade on 09/19/2016. He is due for repeat ultrasound in September before his visit with vascular surgery.  He was recently treated for pneumonia on 01/14/2017. He was given IV Zithromax in the ED and the patient discharged on Zithromax. Of note, review of the recent labs showed worsening kidney function. In December 2017, his creatinine was 1.2, this trended up to 1.6 on repeat basic metabolic panel on 02/13/7105, it further increased to 2.25 on ED visit on 01/14/2017. He will be seeing his primary care physician tomorrow, I will defer treatment of pneumonia to his primary care physician. His wife mentions she does not think the azithromycin is helping him. On physical exam today, he does not have any heart failure symptoms.  His blood pressure is quite low today initially at 90/50, on manual recheck by myself, blood pressure did go up to 100/44, however remained very low. Since his lower extremity angiography was contrast dye was in February, therefore it is unlikely that the current worsening kidney situation is related to the contrast nephropathy, it is probably related to drop in the blood pressure in the setting of pneumonia. I discontinued his lisinopril, I also decreased his metoprolol tartrate by half. I'll let his primary care physician to decide whether or not to hold his metformin given worsening kidney function. He initially made today's appointment for evaluation and palpitation, on further questioning, he described the symptoms not as tachycardia or tachypalpitation but more awareness of his heart beat and loud thumping sensation in the chest at night when he is laying on his side. I think some of this may be related to the recent illness as well. EKG obtained today showed no sign of ischemia nor was there any evidence of atrial fibrillation. I think the current goal is to treat the underlying pneumonia, once his pneumonia and his blood pressure did improve, if he continued to have symptom, then we can consider heart monitor. I will bring him back in 2 weeks for reassessment.   Past Medical History:  Diagnosis Date  . CAD S/P percutaneous coronary angioplasty 1998   PCI TO CX  . Diabetes mellitus without complication ([place])   . GERD (gastroesophageal reflux disease)   . 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 Hypercholesteremia   . Hypertension   .  Neuropathy   . Peripheral vascular disease ([place])    s/p R-L FEM-FEM BYPASS  . Tachycardia    after CABG, pt on medicine for this    Past Surgical History:  Procedure Laterality Date  . ABDOMINAL AORTOGRAM Bilateral 09/19/2016   Procedure: iliac;  Surgeon: Serafina Mitchell, MD;  Location: [facility];  Service: Cardiovascular;  Laterality: Bilateral;  . CARDIAC  CATHETERIZATION  2003   with stent  . CARDIAC CATHETERIZATION N/A 10/11/2015   Procedure: Left Heart Cath and Coronary Angiography;  Surgeon: Peter M Martinique, MD;  Location: [facility];  Service: Cardiovascular;  Laterality: N/A;  . COLONOSCOPY N/A 09/22/2013   Procedure: COLONOSCOPY;  Surgeon: Daneil Dolin, MD;  [facility];  Service: Endoscopy;  Laterality: N/A;  9:30 AM  . CORONARY ARTERY BYPASS GRAFT N/A 10/18/2015   Procedure: CORONARY ARTERY BYPASS GRAFTING (CABG) x  five, using left internal mammary artery and right leg greater saphenous vein harvested endoscopically;  Surgeon: [name], MD;  Location: [facility];  Service: Open Heart Surgery;  Laterality: N/A;  . ENDARTERECTOMY FEMORAL Right 08/09/2016   Procedure: ENDARTERECTOMY FEMORAL WITH VEIN PATCH ANGIOPLASTY;  Surgeon: Serafina Mitchell, MD;  Location: [facility];  Service: Vascular;  Laterality: Right;  . FEMORAL-FEMORAL BYPASS GRAFT Bilateral 08/09/2016   Procedure: REVISION BYPASS GRAFT RIGHT FEMORAL-LEFT FEMORAL ARTERY;  Surgeon: Serafina Mitchell, MD;  Location: [facility];  Service: Vascular;  Laterality: Bilateral;  . FEMORAL-POPLITEAL BYPASS GRAFT    . PERIPHERAL VASCULAR CATHETERIZATION N/A 05/08/2016   Procedure: Lower Extremity Angiography;  Surgeon: Lorretta Harp, MD;  Location: [facility];  Service: Cardiovascular;  Laterality: N/A;  . PERIPHERAL VASCULAR INTERVENTION Right 09/19/2016   Procedure: Peripheral Vascular Intervention;  Surgeon: Serafina Mitchell, MD;  Location: [facility];  Service: Cardiovascular;  Laterality: Right;  ext iliac stent  . TEE WITHOUT CARDIOVERSION N/A 10/18/2015   Procedure: TRANSESOPHAGEAL ECHOCARDIOGRAM (TEE);  Surgeon: [name], MD;  Location: [facility];  Service: Open Heart Surgery;  Laterality: N/A;    Current Medications: Outpatient Medications Prior to Visit  Medication Sig Dispense Refill  . aspirin 81 MG tablet Take 81 mg by mouth daily.    [name]  azithromycin (ZITHROMAX Z-PAK) 250 MG tablet As directed 6 each 0  . Cholecalciferol (VITAMIN D) 2000 UNITS tablet Take 2,000 Units by mouth daily.    . clopidogrel (PLAVIX) 75 MG tablet Take 75 mg by mouth daily with breakfast.    . ferrous sulfate 325 (65 FE) MG tablet Take 325-650 mg by mouth 2 (two) times daily with a meal. 650mg  in the morning and 325mg  in the evening     . gabapentin (NEURONTIN) 300 MG capsule Take 300 mg by mouth 3 (three) times daily.     [name] glipiZIDE (GLUCOTROL) 5 MG tablet Take 10 mg by mouth 2 (two) times daily before a meal.     . metFORMIN (GLUCOPHAGE) 1000 MG tablet Take 2,000 mg by mouth 2 (two) times daily with a meal.     . pantoprazole (PROTONIX) 40 MG tablet Take 40 mg by mouth daily.    . simvastatin (ZOCOR) 40 MG tablet Take 20 mg by mouth at bedtime.     [name] lisinopril (PRINIVIL,ZESTRIL) 5 MG tablet Take 5 mg by mouth daily.     . metoprolol (LOPRESSOR) 50 MG tablet Take 1 tablet (50 mg total) by mouth 2 (two) times daily. 180 tablet 3  No facility-administered medications prior to visit.      Allergies:   No known allergies   Social History   Social History  . Marital status: Married    Spouse name: N/A  . Number of children: N/A  . Years of education: N/A   Social History Main Topics  . Smoking status: Former Smoker    Packs/day: 2.00    Years: 30.00    Types: Cigarettes    Quit date: 08/14/2000  . Smokeless tobacco: Never Used  . Alcohol use No  . Drug use: No  . Sexual activity: Not Asked   Other Topics Concern  . None   Social History Narrative  . None     Family History:  The patient's family history includes Heart attack in his mother; Stroke in his mother.   ROS:   Please see the history of present illness.    ROS All other systems reviewed and are negative.   PHYSICAL EXAM:   VS:  BP (!) 90/50   Pulse 67   Ht 5\' 8"  (1.727 m)   Wt 161 lb 6.4 oz (73.2 kg)   SpO2 96%   BMI 24.54 kg/m    GEN: Well nourished, well  developed, in no acute distress  HEENT: normal  Neck: no JVD, carotid bruits, or masses Cardiac: RRR; no murmurs, rubs, or gallops,no edema  Respiratory:  clear to auscultation bilaterally, normal work of breathing GI: soft, nontender, nondistended, + BS MS: no deformity or atrophy  Skin: warm and dry, no rash Neuro:  Alert and Oriented x 3, Strength and sensation are intact Psych: euthymic mood, full affect  Wt Readings from Last 3 Encounters:  01/16/17 161 lb 6.4 oz (73.2 kg)  01/14/17 157 lb (71.2 kg)  12/05/16 160 lb (72.6 kg)      Studies/Labs Reviewed:   EKG:  EKG is ordered today.  The ekg ordered today demonstrates Normal sinus rhythm without significant ST-T wave changes.  Recent Labs: 08/01/2016: ALT 11 01/14/2017: BUN 44; Creatinine, Ser 2.25; Hemoglobin 10.8; Platelets 428; Potassium 5.1; Sodium 133   Lipid Panel    Component Value Date/Time   CHOL 110 10/11/2015 0536   TRIG 135 10/11/2015 0536   HDL 27 (L) 10/11/2015 0536   CHOLHDL 4.1 10/11/2015 0536   VLDL 27 10/11/2015 0536   LDLCALC 56 10/11/2015 0536    Additional studies/ records that were reviewed today include:   Cath 10/11/2015 Conclusion    Prox RCA lesion, 90% stenosed.  Prox RCA to Mid RCA lesion, 95% stenosed.  Prox Cx to Mid Cx lesion, 80% stenosed.  Ost LAD to Prox LAD lesion, 85% stenosed.  Mid LAD lesion, 85% stenosed.  1st Diag lesion, 70% stenosed.   1. Severe 3 vessel obstructive CAD   Plan: CT surgery consult for CABG. Hold Plavix    CABG by Dr. Roxan Hockey 10/18/2015 PRE-OPERATIVE DIAGNOSIS:  3 VESSEL CAD with UNSTABLE ANGINA  POST-OPERATIVE DIAGNOSIS:  3 VESSEL CAD with UNSTABLE ANGINA  PROCEDURE: TRANSESOPHAGEAL ECHOCARDIOGRAM (TEE),   MEDIAN STERNOTOMY for  CORONARY ARTERY BYPASS GRAFTING (CABG) x 5             LIMA to LAD             SVG to DIAGONAL             SEQUENTIAL SVG to OM1 and OM2             SVG to DISTAL RCA ENDOSCOPIC VEIN HARVEST RIGHT  LEG  Echo 10/10/2015 LV EF: 50%  - Left ventricle: The cavity size was normal. Systolic function was   very mildly reduced. The estimated ejection fraction was 50%.   Mild hypokinesis of the anterior and anterolateral myocardium.   Left ventricular diastolic function parameters were normal. - Aortic valve: Transvalvular velocity was within the normal range.   There was no stenosis. There was no regurgitation. - Mitral valve: There was trivial regurgitation. - Right ventricle: The cavity size was normal. Wall thickness was   normal. Systolic function was normal. - Tricuspid valve: There was mild regurgitation. - Pulmonary arteries: Systolic pressure was mildly increased. PA   peak pressure: 38 mm Hg (S). - Inferior vena cava: The vessel was dilated. The respirophasic   diameter changes were blunted (< 50%), consistent with elevated   central venous pressure.   ASSESSMENT:    1. Palpitation   2. Essential hypertension   3. Coronary artery disease involving coronary bypass graft of native heart without angina pectoris   4. Hyperlipidemia, unspecified hyperlipidemia type   5. PAD (peripheral artery disease) ([place])   6. Controlled type 2 diabetes mellitus without complication, without long-term current use of insulin (H[facility])      PLAN:  In order of problems listed above:  1. Palpitation: Patient denies any obvious tachycardia during that episode, based on symptoms, it sounds more like awareness of one's own heart beat. Today's EKG showed no evidence of atrial fibrillation. His symptom is also improving today as well. I would like to hold off on heart monitor. I think his symptom is atypical for atrial fibrillation, some of his symptom is likely explained by the recent malaise associated with pneumonia. I recommended him to follow-up with his primary care physician. I recommended him to treat pneumonia first, once his blood pressure, and his pneumonia improved, and if he continued to  have symptom, then I would recommend a heart monitor. I will bring him back in 2 weeks for reassessment.  2. Hypertension: He was actually hypotensive today, I held his lisinopril and decrease his metoprolol titrate to 25 mg twice a day.  3. Acute on chronic renal insufficiency: Basal lab work obtained last year, his creatinine ranges from 1.2-1.5. Since December, however his creatinine has been trending up, lab work obtained from emergency room 3 days ago showed creatinine 2.25. This is likely acute kidney injury associated with low blood pressure recently. Interestingly enough, his blood pressure in the emergency room was actually normal. Although he did have contrast dye during vascular procedure back in February, I doubt this represent contrast nephropathy.  4. Pneumonia: Received azithromycin, continue to have symptom, will defer to primary care physician.  5. CAD s/p CABG: No obvious evidence of angina, EKG shows no ischemic changes.  6. Hyperlipidemia: On Zocor 40 mg daily  7. PAD: Continue aspirin and Plavix.    Medication Adjustments/Labs and Tests Ordered: Current medicines are reviewed at length with the patient today.  Concerns regarding medicines are outlined above.  Medication changes, Labs and Tests ordered today are listed in the Patient Instructions below. Patient Instructions  Medication Instructions: Please stop taking your Lisinopril Your Metoprolol has been decreased to 25 mg tablet twice daily.   Follow-Up: Your physician recommends that you schedule a follow-up appointment in: 2 weeks with Almyra Deforest, PA   If you need a refill on your cardiac medications before your next appointment, please call your pharmacy.      Hilbert Corrigan, [place]  01/16/2017 5:13 PM  [facility] [address], [place], [place]  [address] Phone: [phone]; Fax: [phone]

## 2017-01-19 ENCOUNTER — Emergency Department (HOSPITAL_COMMUNITY): Payer: Medicare Other

## 2017-01-19 ENCOUNTER — Inpatient Hospital Stay (HOSPITAL_COMMUNITY)
Admission: EM | Admit: 2017-01-19 | Discharge: 2017-01-20 | DRG: 195 | Disposition: A | Discharge status: Home / Self Care | Payer: Medicare Other | Attending: Internal Medicine | Admitting: Internal Medicine

## 2017-01-19 ENCOUNTER — Encounter (HOSPITAL_COMMUNITY): Payer: Self-pay | Admitting: Emergency Medicine

## 2017-01-19 ENCOUNTER — Other Ambulatory Visit: Payer: Self-pay

## 2017-01-19 ENCOUNTER — Inpatient Hospital Stay (HOSPITAL_COMMUNITY): Payer: Medicare Other

## 2017-01-19 DIAGNOSIS — E1151 Type 2 diabetes mellitus with diabetic peripheral angiopathy without gangrene: Secondary | ICD-10-CM | POA: Diagnosis present

## 2017-01-19 DIAGNOSIS — Z7982 Long term (current) use of aspirin: Secondary | ICD-10-CM | POA: Diagnosis not present

## 2017-01-19 DIAGNOSIS — I739 Peripheral vascular disease, unspecified: Secondary | ICD-10-CM | POA: Diagnosis present

## 2017-01-19 DIAGNOSIS — R9389 Abnormal findings on diagnostic imaging of other specified body structures: Secondary | ICD-10-CM | POA: Diagnosis present

## 2017-01-19 DIAGNOSIS — Z951 Presence of aortocoronary bypass graft: Secondary | ICD-10-CM | POA: Diagnosis not present

## 2017-01-19 DIAGNOSIS — Z955 Presence of coronary angioplasty implant and graft: Secondary | ICD-10-CM | POA: Diagnosis not present

## 2017-01-19 DIAGNOSIS — Z823 Family history of stroke: Secondary | ICD-10-CM | POA: Diagnosis not present

## 2017-01-19 DIAGNOSIS — I129 Hypertensive chronic kidney disease with stage 1 through stage 4 chronic kidney disease, or unspecified chronic kidney disease: Secondary | ICD-10-CM | POA: Diagnosis not present

## 2017-01-19 DIAGNOSIS — I2510 Atherosclerotic heart disease of native coronary artery without angina pectoris: Secondary | ICD-10-CM | POA: Diagnosis present

## 2017-01-19 DIAGNOSIS — Z7984 Long term (current) use of oral hypoglycemic drugs: Secondary | ICD-10-CM | POA: Diagnosis not present

## 2017-01-19 DIAGNOSIS — E1122 Type 2 diabetes mellitus with diabetic chronic kidney disease: Secondary | ICD-10-CM | POA: Diagnosis not present

## 2017-01-19 DIAGNOSIS — Y95 Nosocomial condition: Secondary | ICD-10-CM | POA: Diagnosis present

## 2017-01-19 DIAGNOSIS — E119 Type 2 diabetes mellitus without complications: Secondary | ICD-10-CM

## 2017-01-19 DIAGNOSIS — J189 Pneumonia, unspecified organism: Principal | ICD-10-CM | POA: Diagnosis present

## 2017-01-19 DIAGNOSIS — E7800 Pure hypercholesterolemia, unspecified: Secondary | ICD-10-CM | POA: Diagnosis not present

## 2017-01-19 DIAGNOSIS — Z87891 Personal history of nicotine dependence: Secondary | ICD-10-CM | POA: Diagnosis not present

## 2017-01-19 DIAGNOSIS — Z79899 Other long term (current) drug therapy: Secondary | ICD-10-CM | POA: Diagnosis not present

## 2017-01-19 DIAGNOSIS — R938 Abnormal findings on diagnostic imaging of other specified body structures: Secondary | ICD-10-CM | POA: Diagnosis not present

## 2017-01-19 DIAGNOSIS — R911 Solitary pulmonary nodule: Secondary | ICD-10-CM

## 2017-01-19 DIAGNOSIS — Z8249 Family history of ischemic heart disease and other diseases of the circulatory system: Secondary | ICD-10-CM | POA: Diagnosis not present

## 2017-01-19 DIAGNOSIS — A419 Sepsis, unspecified organism: Secondary | ICD-10-CM | POA: Diagnosis not present

## 2017-01-19 DIAGNOSIS — N182 Chronic kidney disease, stage 2 (mild): Secondary | ICD-10-CM | POA: Diagnosis not present

## 2017-01-19 DIAGNOSIS — R05 Cough: Secondary | ICD-10-CM | POA: Diagnosis not present

## 2017-01-19 DIAGNOSIS — K219 Gastro-esophageal reflux disease without esophagitis: Secondary | ICD-10-CM | POA: Diagnosis present

## 2017-01-19 DIAGNOSIS — R0602 Shortness of breath: Secondary | ICD-10-CM | POA: Diagnosis not present

## 2017-01-19 DIAGNOSIS — J984 Other disorders of lung: Secondary | ICD-10-CM | POA: Diagnosis not present

## 2017-01-19 LAB — COMPREHENSIVE METABOLIC PANEL
ALT: 9 U/L — AB (ref 17–63)
AST: 15 U/L (ref 15–41)
Albumin: 3.6 g/dL (ref 3.5–5.0)
Alkaline Phosphatase: 62 U/L (ref 38–126)
Anion gap: 12 (ref 5–15)
BUN: 35 mg/dL — ABNORMAL HIGH (ref 6–20)
CALCIUM: 9.5 mg/dL (ref 8.9–10.3)
CHLORIDE: 108 mmol/L (ref 101–111)
CO2: 16 mmol/L — ABNORMAL LOW (ref 22–32)
CREATININE: 1.82 mg/dL — AB (ref 0.61–1.24)
GFR, EST AFRICAN AMERICAN: 42 mL/min — AB (ref >60)
GFR, EST NON AFRICAN AMERICAN: 36 mL/min — AB (ref >60)
Glucose, Bld: 220 mg/dL — ABNORMAL HIGH (ref 65–99)
Potassium: 5 mmol/L (ref 3.5–5.1)
Sodium: 136 mmol/L (ref 135–145)
Total Bilirubin: 0.6 mg/dL (ref 0.3–1.2)
Total Protein: 7.3 g/dL (ref 6.5–8.1)

## 2017-01-19 LAB — URINALYSIS, ROUTINE W REFLEX MICROSCOPIC
BACTERIA UA: NONE SEEN
Bilirubin Urine: NEGATIVE
Glucose, UA: >=500 mg/dL — AB
Hgb urine dipstick: NEGATIVE
KETONES UR: NEGATIVE mg/dL
LEUKOCYTES UA: NEGATIVE
Nitrite: NEGATIVE
PROTEIN: NEGATIVE mg/dL
RBC / HPF: NONE SEEN RBC/hpf (ref 0–5)
SQUAMOUS EPITHELIAL / LPF: NONE SEEN
Specific Gravity, Urine: 1.009 (ref 1.005–1.030)
pH: 5 (ref 5.0–8.0)

## 2017-01-19 LAB — CBC WITH DIFFERENTIAL/PLATELET
Basophils Absolute: 0 10*3/uL (ref 0.0–0.1)
Basophils Relative: 0 %
EOS ABS: 0 10*3/uL (ref 0.0–0.7)
EOS PCT: 0 %
HCT: 32.3 % — ABNORMAL LOW (ref 39.0–52.0)
Hemoglobin: 10.6 g/dL — ABNORMAL LOW (ref 13.0–17.0)
Lymphocytes Relative: 5 %
Lymphs Abs: 0.8 10*3/uL (ref 0.7–4.0)
MCH: 27.2 pg (ref 26.0–34.0)
MCHC: 32.8 g/dL (ref 30.0–36.0)
MCV: 82.8 fL (ref 78.0–100.0)
MONO ABS: 1.3 10*3/uL — AB (ref 0.1–1.0)
Monocytes Relative: 8 %
NEUTROS PCT: 87 %
Neutro Abs: 14.3 10*3/uL — ABNORMAL HIGH (ref 1.7–7.7)
PLATELETS: 559 10*3/uL — AB (ref 150–400)
RBC: 3.9 MIL/uL — AB (ref 4.22–5.81)
RDW: 15.4 % (ref 11.5–15.5)
WBC: 16.4 10*3/uL — AB (ref 4.0–10.5)

## 2017-01-19 LAB — I-STAT CG4 LACTIC ACID, ED: Lactic Acid, Venous: 2.44 mmol/L (ref 0.5–1.9)

## 2017-01-19 LAB — CULTURE, BLOOD (ROUTINE X 2): CULTURE: NO GROWTH

## 2017-01-19 LAB — GLUCOSE, CAPILLARY: Glucose-Capillary: 170 mg/dL — ABNORMAL HIGH (ref 65–99)

## 2017-01-19 LAB — CG4 I-STAT (LACTIC ACID): Lactic Acid, Venous: 1.08 mmol/L (ref 0.5–1.9)

## 2017-01-19 MED ORDER — AMLODIPINE BESYLATE 5 MG PO TABS
10.0000 mg | ORAL_TABLET | Freq: Every day | ORAL | Status: DC
  Administered 2017-01-20: 2.5 mg via ORAL
  Filled 2017-01-19: qty 2

## 2017-01-19 MED ORDER — VANCOMYCIN HCL IN DEXTROSE 1-5 GM/200ML-% IV SOLN: 1000.0000 mg | INTRAVENOUS | Status: DC

## 2017-01-19 MED ORDER — SODIUM CHLORIDE 0.9 % IV BOLUS (SEPSIS)
1000.0000 mL | Freq: Once | INTRAVENOUS | Status: AC
  Administered 2017-01-19: 1000 mL via INTRAVENOUS

## 2017-01-19 MED ORDER — HEPARIN SODIUM (PORCINE) 5000 UNIT/ML IJ SOLN
5000.0000 [IU] | Freq: Three times a day (TID) | INTRAMUSCULAR | Status: DC
  Administered 2017-01-19 – 2017-01-20 (×3): 5000 [IU] via SUBCUTANEOUS
  Filled 2017-01-19 (×4): qty 1

## 2017-01-19 MED ORDER — SIMVASTATIN 20 MG PO TABS
20.0000 mg | ORAL_TABLET | Freq: Every day | ORAL | Status: DC
  Administered 2017-01-19: 20 mg via ORAL
  Filled 2017-01-19: qty 1

## 2017-01-19 MED ORDER — METOPROLOL TARTRATE 25 MG PO TABS
25.0000 mg | ORAL_TABLET | Freq: Two times a day (BID) | ORAL | Status: DC
  Administered 2017-01-19: 25 mg via ORAL
  Filled 2017-01-19 (×2): qty 1

## 2017-01-19 MED ORDER — SODIUM CHLORIDE 0.9% FLUSH
3.0000 mL | Freq: Two times a day (BID) | INTRAVENOUS | Status: DC
  Administered 2017-01-19 – 2017-01-20 (×2): 3 mL via INTRAVENOUS

## 2017-01-19 MED ORDER — GABAPENTIN 300 MG PO CAPS
300.0000 mg | ORAL_CAPSULE | Freq: Three times a day (TID) | ORAL | Status: DC
  Administered 2017-01-19 – 2017-01-20 (×4): 300 mg via ORAL
  Filled 2017-01-19 (×4): qty 1

## 2017-01-19 MED ORDER — ASPIRIN EC 81 MG PO TBEC
81.0000 mg | DELAYED_RELEASE_TABLET | Freq: Every day | ORAL | Status: DC
  Administered 2017-01-20: 81 mg via ORAL
  Filled 2017-01-19: qty 1

## 2017-01-19 MED ORDER — VANCOMYCIN HCL IN DEXTROSE 1-5 GM/200ML-% IV SOLN
1000.0000 mg | Freq: Once | INTRAVENOUS | Status: AC
  Administered 2017-01-19: 1000 mg via INTRAVENOUS
  Filled 2017-01-19: qty 200

## 2017-01-19 MED ORDER — INSULIN ASPART 100 UNIT/ML ~~LOC~~ SOLN
0.0000 [IU] | Freq: Three times a day (TID) | SUBCUTANEOUS | Status: DC
  Administered 2017-01-19: 3 [IU] via SUBCUTANEOUS

## 2017-01-19 MED ORDER — CEFEPIME HCL 2 G IJ SOLR
2.0000 g | Freq: Once | INTRAMUSCULAR | Status: AC
  Administered 2017-01-19: 2 g via INTRAVENOUS
  Filled 2017-01-19: qty 2

## 2017-01-19 MED ORDER — CEFEPIME HCL 2 G IJ SOLR
2.0000 g | INTRAMUSCULAR | Status: DC
  Filled 2017-01-19 (×3): qty 2

## 2017-01-19 MED ORDER — INSULIN ASPART 100 UNIT/ML ~~LOC~~ SOLN: 0.0000 [IU] | Freq: Every day | SUBCUTANEOUS | Status: DC

## 2017-01-19 MED ORDER — CILOSTAZOL 100 MG PO TABS
100.0000 mg | ORAL_TABLET | Freq: Two times a day (BID) | ORAL | Status: DC
  Administered 2017-01-19 – 2017-01-20 (×2): 100 mg via ORAL
  Filled 2017-01-19 (×6): qty 1

## 2017-01-19 MED ORDER — ONDANSETRON HCL 4 MG/2ML IJ SOLN: 4.0000 mg | Freq: Four times a day (QID) | INTRAMUSCULAR | Status: DC | PRN

## 2017-01-19 MED ORDER — ACETAMINOPHEN 650 MG RE SUPP: 650.0000 mg | Freq: Four times a day (QID) | RECTAL | Status: DC | PRN

## 2017-01-19 MED ORDER — ACETAMINOPHEN 325 MG PO TABS: 650.0000 mg | ORAL_TABLET | Freq: Four times a day (QID) | ORAL | Status: DC | PRN

## 2017-01-19 MED ORDER — SODIUM CHLORIDE 0.9 % IV SOLN
INTRAVENOUS | Status: DC
  Administered 2017-01-19: 16:00:00 via INTRAVENOUS

## 2017-01-19 MED ORDER — INSULIN ASPART 100 UNIT/ML ~~LOC~~ SOLN: 4.0000 [IU] | Freq: Three times a day (TID) | SUBCUTANEOUS | Status: DC

## 2017-01-19 MED ORDER — ONDANSETRON HCL 4 MG PO TABS: 4.0000 mg | ORAL_TABLET | Freq: Four times a day (QID) | ORAL | Status: DC | PRN

## 2017-01-19 MED ORDER — LEVOFLOXACIN IN D5W 750 MG/150ML IV SOLN
750.0000 mg | INTRAVENOUS | Status: DC
  Administered 2017-01-19: 750 mg via INTRAVENOUS
  Filled 2017-01-19: qty 150

## 2017-01-19 MED ORDER — PANTOPRAZOLE SODIUM 40 MG PO TBEC
40.0000 mg | DELAYED_RELEASE_TABLET | Freq: Every day | ORAL | Status: DC
  Administered 2017-01-19 – 2017-01-20 (×2): 40 mg via ORAL
  Filled 2017-01-19 (×2): qty 1

## 2017-01-19 NOTE — ED Notes (Signed)
Patient returned from xray.

## 2017-01-19 NOTE — ED Notes (Signed)
PA at bedside.

## 2017-01-19 NOTE — Progress Notes (Signed)
PHARMACY NOTE:  ANTIMICROBIAL RENAL DOSAGE ADJUSTMENT  Current antimicrobial regimen includes a mismatch between antimicrobial dosage and estimated renal function.  As per policy approved by the Pharmacy & Therapeutics and Medical Executive Committees, the antimicrobial dosage will be adjusted accordingly.  Current antimicrobial dosage:  Levaquin 750 mg IV every 24 hours  Indication: CAP  Renal Function:  Estimated Creatinine Clearance: 35.8 mL/min (A) (by C-G formula based on SCr of 1.82 mg/dL (H)). []      On intermittent HD, scheduled: []      On CRRT    Antimicrobial dosage has been changed to:  Levaquin 750 mg IV every 48 hours  Additional comments:   Thank you for allowing pharmacy to be a part of this patient's care.  Gildardo Griffes [place], [facility] 01/19/2017 4:05 PM

## 2017-01-19 NOTE — H&P (Signed)
History and Physical    [name] WPY:[id number] DOB: 07-26-1946 DOA: 01/19/2017  PCP: [name], MD  Patient coming from: Home.    Chief Complaint:   Hemoptysis.   HPI: [name] is an 71 y.o. male with hx of CAD s/p CABG, hx of stable right lung nodule, DM changing to injection yesterday from Metformin, hx of HTN previously on lisinopril, presented to the ER with coughs, fever, and blood tingued sputum.  Work up in the ER showed leukocytosis with WBC of 19K, and CXR showed progression of his PNA.  His Cr was elevated to 1.8 and lactic acid was elevated to 2.4.  He was started on Van/Cefepime per sepsis protocol, and hospitalist was asked to admit him for CAP.    ED Course:  See above.  Rewiew of Systems:  Constitutional: Negative for malaise, fever and chills. No significant weight loss or weight gain Eyes: Negative for eye pain, redness and discharge, diplopia, visual changes, or flashes of light. ENMT: Negative for ear pain, hoarseness, nasal congestion, sinus pressure and sore throat. No headaches; tinnitus, drooling, or problem swallowing. Cardiovascular: Negative for chest pain, palpitations, diaphoresis, dyspnea and peripheral edema. ; No orthopnea, PND Respiratory: Negative for wheezing and stridor. No pleuritic chestpain. Gastrointestinal: Negative for diarrhea, constipation,  melena, blood in stool, hematemesis, jaundice and rectal bleeding.    Genitourinary: Negative for frequency, dysuria, incontinence,flank pain and hematuria; Musculoskeletal: Negative for back pain and neck pain. Negative for swelling and trauma.;  Skin: . Negative for pruritus, rash, abrasions, bruising and skin lesion.; ulcerations Neuro: Negative for headache, lightheadedness and neck stiffness. Negative for weakness, altered level of consciousness , altered mental status, extremity weakness, burning feet, involuntary movement, seizure and syncope.  Psych: negative for anxiety, depression,  insomnia, tearfulness, panic attacks, hallucinations, paranoia, suicidal or homicidal ideation    Past Medical History:  Diagnosis Date  . CAD S/P percutaneous coronary angioplasty 1998   PCI TO CX  . Diabetes mellitus without complication ([place])   . GERD (gastroesophageal reflux disease)   . 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 Hypercholesteremia   . Hypertension   . Neuropathy   . Peripheral vascular disease ([place])    s/p R-L FEM-FEM BYPASS  . Tachycardia    after CABG, pt on medicine for this    Past Surgical History:  Procedure Laterality Date  . ABDOMINAL AORTOGRAM Bilateral 09/19/2016   Procedure: iliac;  Surgeon: Serafina Mitchell, MD;  Location: [place] [facility];  Service: Cardiovascular;  Laterality: Bilateral;  . CARDIAC CATHETERIZATION  2003   with stent  . CARDIAC CATHETERIZATION N/A 10/11/2015   Procedure: Left Heart Cath and Coronary Angiography;  Surgeon: Robert Sunga M Martinique, MD;  Location: [place] [facility];  Service: Cardiovascular;  Laterality: N/A;  . COLONOSCOPY N/A 09/22/2013   Procedure: COLONOSCOPY;  Surgeon: Daneil Dolin, MD;  [facility];  Service: Endoscopy;  Laterality: N/A;  9:30 AM  . CORONARY ARTERY BYPASS GRAFT N/A 10/18/2015   Procedure: CORONARY ARTERY BYPASS GRAFTING (CABG) x  five, using left internal mammary artery and right leg greater saphenous vein harvested endoscopically;  Surgeon: [name], MD;  [place]: [facility];  Service: Open Heart Surgery;  Laterality: N/A;  . ENDARTERECTOMY FEMORAL Right 08/09/2016   Procedure: ENDARTERECTOMY FEMORAL WITH VEIN PATCH ANGIOPLASTY;  Surgeon: Serafina Mitchell, MD;  Location: [facility];  Service: Vascular;  Laterality: Right;  . FEMORAL-FEMORAL BYPASS GRAFT Bilateral 08/09/2016  Procedure: REVISION BYPASS GRAFT RIGHT FEMORAL-LEFT FEMORAL ARTERY;  Surgeon: Serafina Mitchell, MD;  Location: [facility];  Service: Vascular;  Laterality: Bilateral;  . FEMORAL-POPLITEAL BYPASS GRAFT    . PERIPHERAL VASCULAR  CATHETERIZATION N/A 05/08/2016   Procedure: Lower Extremity Angiography;  Surgeon: Lorretta Harp, MD;  Location: [facility] [facility];  Service: Cardiovascular;  Laterality: N/A;  . PERIPHERAL VASCULAR INTERVENTION Right 09/19/2016   Procedure: Peripheral Vascular Intervention;  Surgeon: Serafina Mitchell, MD;  Location: [place] [facility];  Service: Cardiovascular;  Laterality: Right;  ext iliac stent  . TEE WITHOUT CARDIOVERSION N/A 10/18/2015   Procedure: TRANSESOPHAGEAL ECHOCARDIOGRAM (TEE);  Surgeon: [name], MD;  Location: [place];  Service: Open Heart Surgery;  Laterality: N/A;     reports that he quit smoking about 16 years ago. His smoking use included Cigarettes. He has a 60.00 pack-year smoking history. He has never used smokeless tobacco. He reports that he does not drink alcohol or use drugs.  Allergies  Allergen Reactions  . No Known Allergies     Family History  Problem Relation Age of Onset  . Heart attack Mother   . Stroke Mother   . Colon cancer Neg Hx      Prior to Admission medications   Medication Sig Start Date End Date Taking? Authorizing Provider  amLODipine (NORVASC) 10 MG tablet Take 10 mg by mouth daily.   Yes [provider]  aspirin 81 MG tablet Take 81 mg by mouth daily.   Yes [provider]  azithromycin (ZITHROMAX Z-PAK) 250 MG tablet As directed 01/14/17  Yes Nat Christen, MD  Cholecalciferol (VITAMIN D) 2000 UNITS tablet Take 2,000 Units by mouth daily.   Yes [provider]  cilostazol (PLETAL) 100 MG tablet Take 100 mg by mouth 2 (two) times daily.   Yes [provider]  ferrous sulfate 325 (65 FE) MG tablet Take 325-650 mg by mouth 2 (two) times daily with a meal. 650mg  in the morning and 325mg  in the evening    Yes [provider]  gabapentin (NEURONTIN) 300 MG capsule Take 300 mg by mouth 3 (three) times daily.    Yes [provider]  metoprolol tartrate (LOPRESSOR) 25 MG tablet Take 1  tablet (25 mg total) by mouth 2 (two) times daily. 01/16/17  Yes Almyra Deforest, PA  pantoprazole (PROTONIX) 40 MG tablet Take 40 mg by mouth daily.   Yes [provider]  simvastatin (ZOCOR) 40 MG tablet Take 20 mg by mouth at bedtime.    Yes [provider]    Physical Exam: Vitals:   01/19/17 1300 01/19/17 1330 01/19/17 1400 01/19/17 1430  BP: (!) 119/55 116/62 (!) 125/47 (!) 125/47  Pulse: (!) 101 100 (!) 107 (!) 105  Resp: (!) 23 (!) 21 (!) 22 17  Temp:      TempSrc:      SpO2: 94% 94% 96% 98%      Constitutional: NAD, calm, comfortable Vitals:   01/19/17 1300 01/19/17 1330 01/19/17 1400 01/19/17 1430  BP: (!) 119/55 116/62 (!) 125/47 (!) 125/47  Pulse: (!) 101 100 (!) 107 (!) 105  Resp: (!) 23 (!) 21 (!) 22 17  Temp:      TempSrc:      SpO2: 94% 94% 96% 98%   Eyes: PERRL, lids and conjunctivae normal ENMT: Mucous membranes are moist. Posterior pharynx clear of any exudate or lesions.Normal dentition.  Neck: normal, supple, no masses, no thyromegaly Respiratory: clear  to auscultation bilaterally, no wheezing, no crackles. Normal respiratory effort. No accessory muscle use.  Cardiovascular: Regular rate and rhythm, no murmurs / rubs / gallops. No extremity edema. 2+ pedal pulses. No carotid bruits.  Abdomen: no tenderness, no masses palpated. No hepatosplenomegaly. Bowel sounds positive.  Musculoskeletal: no clubbing / cyanosis. No joint deformity upper and lower extremities. Good ROM, no contractures. Normal muscle tone.  Skin: no rashes, lesions, ulcers. No induration Neurologic: CN 2-12 grossly intact. Sensation intact, DTR normal. Strength 5/5 in all 4.  Psychiatric: Normal judgment and insight. Alert and oriented x 3. Normal mood.    Labs on Admission: I have personally reviewed following labs and imaging studies CBC:  Recent Labs Lab 01/14/17 1255 01/19/17 1136  WBC 10.9* 16.4*  NEUTROABS 9.0* 14.3*  HGB 10.8* 10.6*  HCT 33.1* 32.3*  MCV 83.4  82.8  PLT 428* 767*   Basic Metabolic Panel:  Recent Labs Lab 01/14/17 1255 01/19/17 1136  NA 133* 136  K 5.1 5.0  CL 103 108  CO2 20* 16*  GLUCOSE 320* 220*  BUN 44* 35*  CREATININE 2.25* 1.82*  CALCIUM 9.2 9.5   GFR: Estimated Creatinine Clearance: 37.1 mL/min (A) (by C-G formula based on SCr of 1.82 mg/dL (H)). Liver Function Tests:  Recent Labs Lab 01/19/17 1136  AST 15  ALT 9*  ALKPHOS 62  BILITOT 0.6  PROT 7.3  ALBUMIN 3.6   Urine analysis:    Component Value Date/Time   COLORURINE STRAW (A) 01/19/2017 1400   APPEARANCEUR CLEAR 01/19/2017 1400   LABSPEC 1.009 01/19/2017 1400   PHURINE 5.0 01/19/2017 1400   GLUCOSEU >=500 (A) 01/19/2017 1400   HGBUR NEGATIVE 01/19/2017 1400   BILIRUBINUR NEGATIVE 01/19/2017 1400   KETONESUR NEGATIVE 01/19/2017 1400   PROTEINUR NEGATIVE 01/19/2017 1400   UROBILINOGEN 0.2 03/16/2015 0120   NITRITE NEGATIVE 01/19/2017 1400   LEUKOCYTESUR NEGATIVE 01/19/2017 1400    Recent Results (from the past 240 hour(s))  Blood culture (routine x 2)     Status: None   Collection Time: 01/14/17  1:01 PM  Result Value Ref Range Status   Specimen Description RIGHT ANTECUBITAL  Final   Special Requests   Final    Blood Culture results may not be optimal due to an inadequate volume of blood received in culture bottles   Culture NO GROWTH 5 DAYS  Final   Report Status 01/19/2017 FINAL  Final  Blood Culture (routine x 2)     Status: None (Preliminary result)   Collection Time: 01/19/17 12:18 PM  Result Value Ref Range Status   Specimen Description BLOOD RIGHT ANTECUBITAL  Final   Special Requests   Final    BOTTLES DRAWN AEROBIC AND ANAEROBIC Blood Culture adequate volume   Culture PENDING  Incomplete   Report Status PENDING  Incomplete  Blood Culture (routine x 2)     Status: None (Preliminary result)   Collection Time: 01/19/17 12:18 PM  Result Value Ref Range Status   Specimen Description BLOOD BLOOD RIGHT ARM  Final   Special  Requests   Final    BOTTLES DRAWN AEROBIC AND ANAEROBIC Blood Culture adequate volume   Culture PENDING  Incomplete   Report Status PENDING  Incomplete     Radiological Exams on Admission: Dg Chest 2 View  Result Date: 01/19/2017 CLINICAL DATA:  71 year old male with shortness of breath and cough, suspected left lung pneumonia 5 days ago. EXAM: CHEST  2 VIEW COMPARISON:  01/14/2017 and earlier. FINDINGS: Seated AP  and lateral views of the chest. Progressive patchy left lung opacity about the hilum, and continued left lower lung opacity on the AP view. As before these are not well correlated on the lateral view. No pleural effusion. Stable cardiac size and mediastinal contours. Prior CABG. Calcified aortic atherosclerosis. Stable right lung. No pneumothorax or pulmonary edema. Osteopenia. No acute osseous abnormality identified. Negative visible bowel gas pattern. IMPRESSION: Progressive left perihilar opacity probably representing progression of multifocal bronchopneumonia since 01/14/2017. No pleural effusion or right lung involvement identified. Followup PA and lateral chest X-ray is recommended in 3-4 weeks following trial of antibiotic therapy to ensure resolution and exclude underlying malignancy. Electronically Signed   By: Genevie Ann M.D.   On: 01/19/2017 12:54    EKG: Independently reviewed.  Assessment/Plan Active Problems:   CAP (community acquired pneumonia)   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   PVD (peripheral vascular disease) (HCC)   CKD (chronic kidney disease) stage 2, GFR 60-89 ml/min   Abnormal chest CT   Type 2 diabetes, HbA1c goal < 7% (HCC)   PLAN:   CAP:  Will give IV Levoquin.  Follow CBC and Temp.  BC's were obtained.  He is not hypoxic, and has no wheezing.  Hx of lung nodule:  With hemoptysis, will obtain CT of the chest with no contrast for follow up.  Last CT was in April 2018.  CKD:  Off Metformin and Lisinopril.  There is a component of pre renal azotemia.  Will give  gentle IVF.  Follow Cr.  DM:  Will continue with insulin injection.   Hold Metformin.    DVT prophylaxis: heparin SQ.  Code Status: FULL CODE.  Family Communication: Wife and brother at bedside.  Disposition Plan: home.  Consults called: None. Admission status: inpatient.    Murlean Seelye MD FACP. [facility]  If 7PM-7AM, please contact night-coverage [email or web address] Password [facility]  01/19/2017, 3:27 PM

## 2017-01-19 NOTE — ED Triage Notes (Signed)
Pt dx with pna Sunday and started abx Sunday. Takes the last one today. Pt c/o nausea, urinary freq. Denies pain.states the weakness is the same since sunday

## 2017-01-19 NOTE — Progress Notes (Signed)
Pharmacy Antibiotic Note  [name] is a 71 y.o. male admitted on 01/19/2017 with pneumonia.  Pharmacy has been consulted for [facility] AND CEFEPIME dosing. Initial doses given in ED  Plan:  Vancomycin 1000mg  IV q24h Check trough at steady state Cefepime 2gm  IV q24h Monitor labs, renal fxn, progress and c/s Deescalate ABX when improved / appropriate.       Temp (24hrs), Avg:99.8 F (37.7 C), Min:99.1 F (37.3 C), Max:100.5 F (38.1 C)   Recent Labs Lab 01/14/17 1255 01/19/17 1136 01/19/17 1149  WBC 10.9* 16.4*  --   CREATININE 2.25* 1.82*  --   LATICACIDVEN 1.3  --  2.44*    Estimated Creatinine Clearance: 37.1 mL/min (A) (by C-G formula based on SCr of 1.82 mg/dL (H)).    Allergies  Allergen Reactions  . No Known Allergies    Antimicrobials this admission: Vancomycin 6/8 >>  Cefepime 6/8 >>   Dose adjustments this admission:  Microbiology results:  BCx: pending  UCx: pending   Sputum:    MRSA PCR:   Thank you for allowing pharmacy to be a part of this patient's care.  Thomes, Burak A 01/19/2017 3:14 PM

## 2017-01-19 NOTE — ED Provider Notes (Signed)
The patient is a 71 year old male, he has been having respiratory symptoms for approximately one week, diagnosed with a left middle pneumonia within the last several days but presents today with increasing shortness of breath, increased weakness and feeling febrile. On exam the patient has significant tachycardia to 120 bpm, there is no lower extremity edema or JVD. His heart sounds are clear without murmurs, his lung sounds are rather clear, he does not appear to be in distress, there is no overt wheezing or rales. Abdominal exam is benign, no jaundice, oropharynx is clear and moist, the patient does appear to have symptoms that could be concerning for pneumonia or potentially a pulmonary embolism, his prior x-ray did show a pneumonia, this will need to be followed up as well as labs, his lactic acid was 2.4.  His chest x-ray shows a blossoming pneumonia on the left side, due to his tachycardia, elevated lactic acid, leukocytosis of 16,000 which is far greater than what was several days ago and his relative appearance of being in mild to moderate respiratory distress the patient will need to be admitted to the hospital. I feel that the patient is likely septic from his pneumonia. A code sepsis was called, cultures ordered, antibiotics ordered, will discussed with the hospitalist for admission.  Sepsis - Repeat Assessment  Performed at:    14:00  Vitals     Blood pressure (!) 110/59, pulse (!) 111, temperature (!) 100.5 F (38.1 C), temperature source Rectal, resp. rate 13, SpO2 96 %.  Heart:     Tachycardic  Lungs:    CTA  Capillary Refill:   <2 sec  Peripheral Pulse:   Radial pulse palpable  Skin:     Normal Color   CRITICAL CARE Performed by: Johnna Acosta Total critical care time: 35 minutes Critical care time was exclusive of separately billable procedures and treating other patients. Critical care was necessary to treat or prevent imminent or life-threatening deterioration. Critical  care was time spent personally by me on the following activities: development of treatment plan with patient and/or surrogate as well as nursing, discussions with consultants, evaluation of patient's response to treatment, examination of patient, obtaining history from patient or surrogate, ordering and performing treatments and interventions, ordering and review of laboratory studies, ordering and review of radiographic studies, pulse oximetry and re-evaluation of patient's condition.   Medical screening examination/treatment/procedure(s) were conducted as a shared visit with non-physician practitioner(s) and myself.  I personally evaluated the patient during the encounter.  Clinical Impression:   Final diagnoses:  Sepsis, due to unspecified organism ([place])  HCAP (healthcare-associated pneumonia)         Noemi Chapel, MD 01/20/17 [phone]

## 2017-01-19 NOTE — ED Provider Notes (Signed)
[facility] DEPT Provider Note   CSN: [id number] Arrival date & time: 01/19/17  1040     History   Chief Complaint Chief Complaint  Patient presents with  . Pneumonia    HPI [name] is a 71 y.o. male presenting with a 2 week history of cough and fever.  Patient was seen at our ED last week, and diagnosed with pneumonia. He was given IV fluids and discharged on azithromycin. Patient states over the past week, he has continued not to feel well, had fevers, and decreased energy. His cough is slightly improved, but still productive. He denies nausea, vomiting, abdominal pain, urinary symptoms, or chest pain. Additionally, he reports that his kidney function at the last visit was poor, and continued to be poor at his outpatient follow-up 2 days ago. Primary care told him he needs to see a nephrologist, and he has been told to stop his metformin and start taking insulin.   HPI  Past Medical History:  Diagnosis Date  . CAD S/P percutaneous coronary angioplasty 1998   PCI TO CX  . Diabetes mellitus without complication ([place])   . GERD (gastroesophageal reflux disease)   . 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 Hypercholesteremia   . Hypertension   . Neuropathy   . Peripheral vascular disease ([place])    s/p R-L FEM-FEM BYPASS  . Tachycardia    after CABG, pt on medicine for this    Patient Active Problem List   Diagnosis Date Noted  . PAD (peripheral artery disease) ([place]) 08/09/2016  . Claudication ([place]) 05/08/2016  . HCAP (healthcare-associated pneumonia) 01/12/2016  . Hyponatremia 01/12/2016  . Abnormal chest CT 10/13/2015  . Type 2 diabetes, HbA1c goal < 7% (H[facility])   . Unstable angina ([place])   . Hypomagnesemia   . Healthcare-associated pneumonia 10/09/2015  . Type 2 diabetes mellitus ([place]) 10/09/2015  . ASCVD (arteriosclerotic cardiovascular disease) 10/09/2015  . CKD (chronic kidney disease) stage 2, GFR 60-89 ml/min 10/09/2015  . CAP (community acquired pneumonia) 07/09/2015   . Sepsis ([place]) 07/09/2015  . 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 PVD (peripheral vascular disease) ([place]) 07/09/2015  . Essential hypertension 07/09/2015  . Hypotension 07/09/2015  . Neuropathy, peripheral 07/09/2015  . HLD (hyperlipidemia) 07/09/2015  . AKI (acute kidney injury) ([place]) 07/09/2015    Past Surgical History:  Procedure Laterality Date  . ABDOMINAL AORTOGRAM Bilateral 09/19/2016   Procedure: iliac;  Surgeon: Serafina Mitchell, MD;  Location: [facility];  Service: Cardiovascular;  Laterality: Bilateral;  . CARDIAC CATHETERIZATION  2003   with stent  . CARDIAC CATHETERIZATION N/A 10/11/2015   Procedure: Left Heart Cath and Coronary Angiography;  Surgeon: Peter M Martinique, MD;  Location: [facility];  Service: Cardiovascular;  Laterality: N/A;  . COLONOSCOPY N/A 09/22/2013   Procedure: COLONOSCOPY;  Surgeon: Daneil Dolin, MD;  [facility];  Service: Endoscopy;  Laterality: N/A;  9:30 AM  . CORONARY ARTERY BYPASS GRAFT N/A 10/18/2015   Procedure: CORONARY ARTERY BYPASS GRAFTING (CABG) x  five, using left internal mammary artery and right leg greater saphenous vein harvested endoscopically;  Surgeon: [name], MD;  Location: [facility];  Service: Open Heart Surgery;  Laterality: N/A;  . ENDARTERECTOMY FEMORAL Right 08/09/2016   Procedure: ENDARTERECTOMY FEMORAL WITH VEIN PATCH ANGIOPLASTY;  Surgeon: Serafina Mitchell, MD;  Location: [facility];  Service: Vascular;  Laterality: Right;  . FEMORAL-FEMORAL BYPASS GRAFT Bilateral 08/09/2016  Procedure: REVISION BYPASS GRAFT RIGHT FEMORAL-LEFT FEMORAL ARTERY;  Surgeon: Serafina Mitchell, MD;  Location: [facility];  Service: Vascular;  Laterality: Bilateral;  . FEMORAL-POPLITEAL BYPASS GRAFT    . PERIPHERAL VASCULAR CATHETERIZATION N/A 05/08/2016   Procedure: Lower Extremity Angiography;  Surgeon: Lorretta Harp, MD;  Location: [facility];  Service: Cardiovascular;  Laterality: N/A;  . PERIPHERAL VASCULAR  INTERVENTION Right 09/19/2016   Procedure: Peripheral Vascular Intervention;  Surgeon: Serafina Mitchell, MD;  Location: [facility];  Service: Cardiovascular;  Laterality: Right;  ext iliac stent  . TEE WITHOUT CARDIOVERSION N/A 10/18/2015   Procedure: TRANSESOPHAGEAL ECHOCARDIOGRAM (TEE);  Surgeon: [name], MD;  Location: [facility];  Service: Open Heart Surgery;  Laterality: N/A;       Home Medications    Prior to Admission medications   Medication Sig Start Date End Date Taking? Authorizing Provider  amLODipine (NORVASC) 10 MG tablet Take 10 mg by mouth daily.   Yes [provider]  aspirin 81 MG tablet Take 81 mg by mouth daily.   Yes [provider]  azithromycin (ZITHROMAX Z-PAK) 250 MG tablet As directed 01/14/17  Yes Nat Christen, MD  Cholecalciferol (VITAMIN D) 2000 UNITS tablet Take 2,000 Units by mouth daily.   Yes [provider]  cilostazol (PLETAL) 100 MG tablet Take 100 mg by mouth 2 (two) times daily.   Yes [provider]  ferrous sulfate 325 (65 FE) MG tablet Take 325-650 mg by mouth 2 (two) times daily with a meal. 650mg  in the morning and 325mg  in the evening    Yes [provider]  gabapentin (NEURONTIN) 300 MG capsule Take 300 mg by mouth 3 (three) times daily.    Yes [provider]  metoprolol tartrate (LOPRESSOR) 25 MG tablet Take 1 tablet (25 mg total) by mouth 2 (two) times daily. 01/16/17  Yes Almyra Deforest, PA  pantoprazole (PROTONIX) 40 MG tablet Take 40 mg by mouth daily.   Yes [provider]  simvastatin (ZOCOR) 40 MG tablet Take 20 mg by mouth at bedtime.    Yes [provider]    Family History Family History  Problem Relation Age of Onset  . Heart attack Mother   . Stroke Mother   . Colon cancer Neg Hx     Social History Social History  Substance Use Topics  . Smoking status: Former Smoker    Packs/day: 2.00    Years: 30.00    Types: Cigarettes    Quit date: 08/14/2000    . Smokeless tobacco: Never Used  . Alcohol use No     Allergies   No known allergies   Review of Systems Review of Systems  Constitutional: Positive for activity change, chills, fatigue and fever.  HENT: Negative for congestion, postnasal drip, rhinorrhea and sore throat.   Eyes: Negative for photophobia and visual disturbance.  Respiratory: Positive for cough and shortness of breath. Negative for chest tightness.   Cardiovascular: Negative for chest pain, palpitations and leg swelling.  Gastrointestinal: Negative for abdominal pain, constipation, diarrhea, nausea and vomiting.  Endocrine: Positive for polydipsia and polyuria. Negative for polyphagia.  Genitourinary: Negative for dysuria, frequency and hematuria.  Musculoskeletal: Negative for back pain, neck pain and neck stiffness.  Skin: Negative for rash.  Neurological: Negative for dizziness, light-headedness and headaches.  Psychiatric/Behavioral: Negative for confusion.     Physical Exam Updated Vital Signs BP (!) 125/47   Pulse (!) 105   Temp (!) 100.5  F (38.1 C) (Rectal)   Resp 17   SpO2 98%   Physical Exam  Constitutional: He is oriented to person, place, and time. He appears well-developed and well-nourished. He appears ill. No distress.  HENT:  Head: Normocephalic and atraumatic.  Mouth/Throat: Mucous membranes are dry. No oropharyngeal exudate, posterior oropharyngeal edema or posterior oropharyngeal erythema.  Eyes: Conjunctivae and EOM are normal. Pupils are equal, round, and reactive to light.  Neck: Normal range of motion. Neck supple.  Cardiovascular: Regular rhythm, normal heart sounds and intact distal pulses.  Tachycardia present.   Pulmonary/Chest: Effort normal and breath sounds normal. No respiratory distress. He has no wheezes.  Abdominal: Soft. Bowel sounds are normal. He exhibits no distension. There is no tenderness.  Musculoskeletal: Normal range of motion.  Neurological: He is alert and  oriented to person, place, and time.  Skin: Skin is warm, dry and intact.  Psychiatric: He has a normal mood and affect.  Nursing note and vitals reviewed.    ED Treatments / Results  Labs (all labs ordered are listed, but only abnormal results are displayed) Labs Reviewed  COMPREHENSIVE METABOLIC PANEL - Abnormal; Notable for the following:       Result Value   CO2 16 (*)    Glucose, Bld 220 (*)    BUN 35 (*)    Creatinine, Ser 1.82 (*)    ALT 9 (*)    GFR calc non Af Amer 36 (*)    GFR calc Af Amer 42 (*)    All other components within normal limits  CBC WITH DIFFERENTIAL/PLATELET - Abnormal; Notable for the following:    WBC 16.4 (*)    RBC 3.90 (*)    Hemoglobin 10.6 (*)    HCT 32.3 (*)    Platelets 559 (*)    Neutro Abs 14.3 (*)    Monocytes Absolute 1.3 (*)    All other components within normal limits  URINALYSIS, ROUTINE W REFLEX MICROSCOPIC - Abnormal; Notable for the following:    Color, Urine STRAW (*)    Glucose, UA >=500 (*)    All other components within normal limits  I-STAT CG4 LACTIC ACID, ED - Abnormal; Notable for the following:    Lactic Acid, Venous 2.44 (*)    All other components within normal limits  CULTURE, BLOOD (ROUTINE X 2)  CULTURE, BLOOD (ROUTINE X 2)  I-STAT CG4 LACTIC ACID, ED    EKG  EKG Interpretation None       Radiology Dg Chest 2 View  Result Date: 01/19/2017 CLINICAL DATA:  71 year old male with shortness of breath and cough, suspected left lung pneumonia 5 days ago. EXAM: CHEST  2 VIEW COMPARISON:  01/14/2017 and earlier. FINDINGS: Seated AP and lateral views of the chest. Progressive patchy left lung opacity about the hilum, and continued left lower lung opacity on the AP view. As before these are not well correlated on the lateral view. No pleural effusion. Stable cardiac size and mediastinal contours. Prior CABG. Calcified aortic atherosclerosis. Stable right lung. No pneumothorax or pulmonary edema. Osteopenia. No acute  osseous abnormality identified. Negative visible bowel gas pattern. IMPRESSION: Progressive left perihilar opacity probably representing progression of multifocal bronchopneumonia since 01/14/2017. No pleural effusion or right lung involvement identified. Followup PA and lateral chest X-ray is recommended in 3-4 weeks following trial of antibiotic therapy to ensure resolution and exclude underlying malignancy. Electronically Signed   By: Genevie Ann M.D.   On: 01/19/2017 12:54    Procedures Procedures (including  critical care time)  Medications Ordered in ED Medications  sodium chloride 0.9 % bolus 1,000 mL (0 mLs Intravenous Stopped 01/19/17 1431)  ceFEPIme (MAXIPIME) 2 g in dextrose 5 % 50 mL IVPB (0 g Intravenous Stopped 01/19/17 1336)  vancomycin (VANCOCIN) IVPB 1000 mg/200 mL premix (1,000 mg Intravenous New Bag/Given 01/19/17 1317)  sodium chloride 0.9 % bolus 1,000 mL (1,000 mLs Intravenous New Bag/Given 01/19/17 1330)     Initial Impression / Assessment and Plan / ED Course  I have reviewed the triage vital signs and the nursing notes.  Pertinent labs & imaging results that were available during my care of the patient were reviewed by me and considered in my medical decision making (see chart for details).     On initial assessment, patient appears sick and tired. Physical exam was tachycardia, and patient feels febrile, but otherwise unremarkable, with clear lung sounds. Will order labs and chest x-ray. Will begin fluids. Concern for continued pneumonia, despite antibiotics. Considering patient's presentation, will keep sepsis on the differential, but will wait for lab values returned and chest x-ray to call code sepsis. Patient reported increased frequency of urination, no dysuria. Order UA to ensure no UTI.   Chest x-ray shows worsening of left-sided pneumonia. Lactate 2.4, white count 16.4. Called code sepsis, and contacted hospitalist for admission. Started vancomycin and cefepime. Discussed  findings with patient, and recommended admission to hospital. Patient agrees to this plan.   Hospitalist to admit patient.  Final Clinical Impressions(s) / ED Diagnoses   Final diagnoses:  Sepsis, due to unspecified organism ([place])  HCAP (healthcare-associated pneumonia)    New Prescriptions New Prescriptions   No medications on file     Franchot Heidelberg, PA-C 01/19/17 1448    Noemi Chapel, MD 01/20/17 [phone]

## 2017-01-20 DIAGNOSIS — Z7984 Long term (current) use of oral hypoglycemic drugs: Secondary | ICD-10-CM | POA: Diagnosis not present

## 2017-01-20 DIAGNOSIS — Z7982 Long term (current) use of aspirin: Secondary | ICD-10-CM | POA: Diagnosis not present

## 2017-01-20 DIAGNOSIS — I2510 Atherosclerotic heart disease of native coronary artery without angina pectoris: Secondary | ICD-10-CM | POA: Diagnosis not present

## 2017-01-20 DIAGNOSIS — Z8249 Family history of ischemic heart disease and other diseases of the circulatory system: Secondary | ICD-10-CM | POA: Diagnosis not present

## 2017-01-20 DIAGNOSIS — N182 Chronic kidney disease, stage 2 (mild): Secondary | ICD-10-CM | POA: Diagnosis not present

## 2017-01-20 DIAGNOSIS — E7800 Pure hypercholesterolemia, unspecified: Secondary | ICD-10-CM | POA: Diagnosis not present

## 2017-01-20 DIAGNOSIS — J189 Pneumonia, unspecified organism: Secondary | ICD-10-CM | POA: Diagnosis not present

## 2017-01-20 DIAGNOSIS — R938 Abnormal findings on diagnostic imaging of other specified body structures: Secondary | ICD-10-CM

## 2017-01-20 DIAGNOSIS — I129 Hypertensive chronic kidney disease with stage 1 through stage 4 chronic kidney disease, or unspecified chronic kidney disease: Secondary | ICD-10-CM | POA: Diagnosis not present

## 2017-01-20 DIAGNOSIS — E1122 Type 2 diabetes mellitus with diabetic chronic kidney disease: Secondary | ICD-10-CM | POA: Diagnosis not present

## 2017-01-20 DIAGNOSIS — Z87891 Personal history of nicotine dependence: Secondary | ICD-10-CM | POA: Diagnosis not present

## 2017-01-20 DIAGNOSIS — Z951 Presence of aortocoronary bypass graft: Secondary | ICD-10-CM | POA: Diagnosis not present

## 2017-01-20 DIAGNOSIS — Z79899 Other long term (current) drug therapy: Secondary | ICD-10-CM | POA: Diagnosis not present

## 2017-01-20 DIAGNOSIS — Z823 Family history of stroke: Secondary | ICD-10-CM | POA: Diagnosis not present

## 2017-01-20 DIAGNOSIS — E1151 Type 2 diabetes mellitus with diabetic peripheral angiopathy without gangrene: Secondary | ICD-10-CM | POA: Diagnosis not present

## 2017-01-20 DIAGNOSIS — K219 Gastro-esophageal reflux disease without esophagitis: Secondary | ICD-10-CM | POA: Diagnosis not present

## 2017-01-20 DIAGNOSIS — Z955 Presence of coronary angioplasty implant and graft: Secondary | ICD-10-CM | POA: Diagnosis not present

## 2017-01-20 LAB — CBC
HEMATOCRIT: 28.6 % — AB (ref 39.0–52.0)
HEMOGLOBIN: 9.5 g/dL — AB (ref 13.0–17.0)
MCH: 27.5 pg (ref 26.0–34.0)
MCHC: 33.2 g/dL (ref 30.0–36.0)
MCV: 82.9 fL (ref 78.0–100.0)
Platelets: 459 10*3/uL — ABNORMAL HIGH (ref 150–400)
RBC: 3.45 MIL/uL — AB (ref 4.22–5.81)
RDW: 15.5 % (ref 11.5–15.5)
WBC: 8.4 10*3/uL (ref 4.0–10.5)

## 2017-01-20 LAB — BASIC METABOLIC PANEL
ANION GAP: 11 (ref 5–15)
BUN: 19 mg/dL (ref 6–20)
CHLORIDE: 104 mmol/L (ref 101–111)
CO2: 19 mmol/L — AB (ref 22–32)
Calcium: 8.7 mg/dL — ABNORMAL LOW (ref 8.9–10.3)
Creatinine, Ser: 1.3 mg/dL — ABNORMAL HIGH (ref 0.61–1.24)
GFR calc Af Amer: >60 mL/min (ref >60)
GFR calc non Af Amer: 54 mL/min — ABNORMAL LOW (ref >60)
Glucose, Bld: 155 mg/dL — ABNORMAL HIGH (ref 65–99)
Potassium: 4.5 mmol/L (ref 3.5–5.1)
SODIUM: 134 mmol/L — AB (ref 135–145)

## 2017-01-20 LAB — GLUCOSE, CAPILLARY: Glucose-Capillary: 117 mg/dL — ABNORMAL HIGH (ref 65–99)

## 2017-01-20 MED ORDER — INSULIN GLARGINE 100 UNIT/ML ~~LOC~~ SOLN: SUBCUTANEOUS | 3 refills | Status: DC

## 2017-01-20 MED ORDER — LEVOFLOXACIN 500 MG PO TABS: 500.0000 mg | ORAL_TABLET | Freq: Every day | ORAL | 0 refills | Status: AC

## 2017-01-20 MED ORDER — LEVOFLOXACIN IN D5W 750 MG/150ML IV SOLN
750.0000 mg | INTRAVENOUS | Status: DC
  Administered 2017-01-20: 750 mg via INTRAVENOUS
  Filled 2017-01-20: qty 150

## 2017-01-20 NOTE — Discharge Summary (Signed)
Physician Discharge Summary  [name] XFG:[id number] DOB: [birth year] DOA: 01/19/2017  PCP: [name], MD  Admit date: 01/19/2017 Discharge date: 01/20/2017  Admitted From: Home.  Disposition:  Home.   Recommendations for Outpatient Follow-up:  1. Follow up with PCP in 1-2 weeks  Home Health: None. Equipment/Devices: None.  Discharge Condition: improved.  BS controlled.  Cr 1.3. CODE STATUS: FULL CODE.  Diet recommendation: Carb modified diet.   Brief/Interim Summary:  Patient was admitted into the hospital for coughs, SOB, and trace hemoptysis by me on January 20, 2017.  As per my prior H and P:  "[name] is an 71 y.o. male with hx of CAD s/p CABG, hx of stable right lung nodule, DM changing to injection yesterday from Metformin, hx of HTN previously on lisinopril, presented to the ER with coughs, fever, and blood tingued sputum.  Work up in the ER showed leukocytosis with WBC of 19K, and CXR showed progression of his PNA.  His Cr was elevated to 1.8 and lactic acid was elevated to 2.4.  He was started on Van/Cefepime per sepsis protocol, and hospitalist was asked to admit him for CAP.    HOSPITAL COURSE:  Patient was admitted into the floor, his antibiotics were changed to IV Levoquin.  It was adjusted to 750mg  IV q48 hours due to compromised GFR.  He felt better the following day.  It was postulated that his elevated Cr, necessitating his PCP to stop the Metformin and ACE I, was pre renal due to volume depletion.  He was given IVF, and his metformin and ACE I was continued to be held.  The follow up Cr improved to 1.3.  He was given SSI and his CBG was very controlled.  His leukocytosis improved as well and was normal at 8.4 g per dL the following morning.  As for his known pulmonary nodule, his prior CT march 2017, and follow up CT was April 2018, which showed stability.  A CT without contrast was done, showing PNAs, but stable nodule, suggestive it was a lymph node.  He was reassured.  He  is anxious to go home, and deemed stable for discharge.  Will give him Levoquin 500mg  per day for another 10d and Lantus at 10 units Q hs.  He will follow up with his PCP at the [place].  He will continue to follow up with his CBGs.  His PCP will decide on Metformin and low dose ACE I at follow up given his improved GFR.  Thank you for allowing me to participate in his care.  Good Day.    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)   CKD (chronic kidney disease) stage 2, GFR 60-89 ml/min   Abnormal chest CT   Type 2 diabetes, HbA1c goal < 7% [facility])    Discharge Instructions:  Take your medications as prescribed.  Follow up with your doctor at the [facility] concerning Mefformin and Lisinopril.     Allergies as of 01/20/2017      Reactions   No Known Allergies       Medication List    STOP taking these medications   azithromycin 250 MG tablet Commonly known as:  ZITHROMAX Z-PAK     TAKE these medications   amLODipine 10 MG tablet Commonly known as:  NORVASC Take 10 mg by mouth daily.   aspirin 81 MG  tablet Take 81 mg by mouth daily.   cilostazol 100 MG tablet Commonly known as:  PLETAL Take 100 mg by mouth 2 (two) times daily.   ferrous sulfate 325 (65 FE) MG tablet Take 325-650 mg by mouth 2 (two) times daily with a meal. 650mg  in the morning and 325mg  in the evening   gabapentin 300 MG capsule Commonly known as:  NEURONTIN Take 300 mg by mouth 3 (three) times daily.   insulin glargine 100 UNIT/ML injection Commonly known as:  LANTUS Give 10 units before bedtime each day.     levofloxacin 500 MG tablet Commonly known as:  LEVAQUIN Take 1 tablet (500 mg total) by mouth daily.   metoprolol tartrate 25 MG tablet Commonly known as:  LOPRESSOR Take 1 tablet (25 mg total) by mouth 2 (two) times daily.   pantoprazole 40 MG tablet Commonly known as:  PROTONIX Take 40 mg by  mouth daily.   simvastatin 40 MG tablet Commonly known as:  ZOCOR Take 20 mg by mouth at bedtime.   Vitamin D 2000 units tablet Take 2,000 Units by mouth daily.       Allergies  Allergen Reactions  . No Known Allergies     Consultations:  None.   Procedures/Studies: Dg Chest 2 View  Result Date: 01/19/2017 CLINICAL DATA:  71 year old male with shortness of breath and cough, suspected left lung pneumonia 5 days ago. EXAM: CHEST  2 VIEW COMPARISON:  01/14/2017 and earlier. FINDINGS: Seated AP and lateral views of the chest. Progressive patchy left lung opacity about the hilum, and continued left lower lung opacity on the AP view. As before these are not well correlated on the lateral view. No pleural effusion. Stable cardiac size and mediastinal contours. Prior CABG. Calcified aortic atherosclerosis. Stable right lung. No pneumothorax or pulmonary edema. Osteopenia. No acute osseous abnormality identified. Negative visible bowel gas pattern. IMPRESSION: Progressive left perihilar opacity probably representing progression of multifocal bronchopneumonia since 01/14/2017. No pleural effusion or right lung involvement identified. Followup PA and lateral chest X-ray is recommended in 3-4 weeks following trial of antibiotic therapy to ensure resolution and exclude underlying malignancy. Electronically Signed   By: Genevie Ann M.D.   On: 01/19/2017 12:54   Dg Chest 2 View  Result Date: 01/14/2017 CLINICAL DATA:  Fever and left-sided rib pain. History of pneumonia, diabetes, hypertension, CABG and CAD. EXAM: CHEST  2 VIEW COMPARISON:  04/11/2016; chest CT - 12/05/2016 FINDINGS: Grossly unchanged borderline enlarged cardiac silhouette and mediastinal contours post median sternotomy and CABG. Atherosclerotic plaque within the thoracic aorta. Interval development of ill-defined heterogeneous potential developing airspace opacities within the left mid lung. No pleural effusion or pneumothorax. No evidence of  edema. No acute osseus abnormalities. IMPRESSION: 1. Findings worrisome for developing left mid lung pneumonia. A follow-up chest radiograph in 3 to 4 weeks after treatment is recommended to ensure resolution. 2.  Aortic Atherosclerosis (ICD10-I70.0). Electronically Signed   By: Sandi Mariscal M.D.   On: 01/14/2017 12:00   Ct Chest Wo Contrast  Result Date: 01/19/2017 CLINICAL DATA:  Cough with blood-tinged sputum. Fever and leukocytosis. Patient diagnosed with pneumonia 01/14/2017. EXAM: CT CHEST WITHOUT CONTRAST TECHNIQUE: Multidetector CT imaging of the chest was performed following the standard protocol without IV contrast. COMPARISON:  CT chest 12/05/2016.  PA and lateral chest 01/14/2017. FINDINGS: Cardiovascular: The patient is status post CABG. Extensive calcific aortic and coronary atherosclerosis noted. Heart size normal. No pericardial effusion. Mediastinum/Nodes: A precarinal node on the right measuring 0.7  cm on image 56 is unchanged since the most recent chest CT. No pathologically enlarged lymph nodes by CT size criteria. Lungs/Pleura: No pleural effusion. Airspace disease is seen in the left upper and lower lobes, worse in the upper lobe. Patchy airspace opacity is also identified in the right upper lobe. Right middle and lower lobes are spared. Small clusters of nodules in the upper lobes as seen on the prior chest CT scan are difficult to visualize today due to new airspace disease. 0.3 cm nodule along the minor fissure on image 81 is unchanged. Upper Abdomen: Cyst in the upper pole of the left kidney atherosclerosis noted. No acute finding. Musculoskeletal:  No acute abnormality. IMPRESSION: Bilateral upper lobe and left lower lobe airspace disease most consistent with pneumonia. The appearance is worse than on the 01/14/2017 plain films of the chest. No change in 0.3 cm nodule along the minor fissure likely representing a lymph node. Atherosclerosis. Electronically Signed   By: Inge Rise  M.D.   On: 01/19/2017 19:40    Subjective: wanted to go home.    Discharge Exam: Vitals:   01/20/17 0500 01/20/17 1230  BP: (!) 120/50 (!) 143/68  Pulse: 92 84  Resp: 18 18  Temp: 98.2 F (36.8 C)    Vitals:   01/19/17 1547 01/19/17 2100 01/20/17 0500 01/20/17 1230  BP: (!) 128/59 115/60 (!) 120/50 (!) 143/68  Pulse: (!) 101 98 92 84  Resp:  18 18 18   Temp: 98.4 F (36.9 C) 98.3 F (36.8 C) 98.2 F (36.8 C)   TempSrc: Oral Oral Oral   SpO2: 100% 97% 97%   Weight: 75.3 kg (166 lb)     Height: 5\' 7"  (1.702 m)       General: Pt is alert, awake, not in acute distress Cardiovascular: RRR, S1/S2 +, no rubs, no gallops Respiratory: CTA bilaterally, no wheezing, no rhonchi Abdominal: Soft, NT, ND, bowel sounds + Extremities: no edema, no cyanosis    The results of significant diagnostics from this hospitalization (including imaging, microbiology, ancillary and laboratory) are listed below for reference.     Microbiology: Recent Results (from the past 240 hour(s))  Blood culture (routine x 2)     Status: None   Collection Time: 01/14/17  1:01 PM  Result Value Ref Range Status   Specimen Description RIGHT ANTECUBITAL  Final   Special Requests   Final    Blood Culture results may not be optimal due to an inadequate volume of blood received in culture bottles   Culture NO GROWTH 5 DAYS  Final   Report Status 01/19/2017 FINAL  Final  Blood Culture (routine x 2)     Status: None (Preliminary result)   Collection Time: 01/19/17 12:18 PM  Result Value Ref Range Status   Specimen Description BLOOD RIGHT ANTECUBITAL  Final   Special Requests   Final    BOTTLES DRAWN AEROBIC AND ANAEROBIC Blood Culture adequate volume   Culture PENDING  Incomplete   Report Status PENDING  Incomplete  Blood Culture (routine x 2)     Status: None (Preliminary result)   Collection Time: 01/19/17 12:18 PM  Result Value Ref Range Status   Specimen Description BLOOD BLOOD RIGHT ARM  Final    Special Requests   Final    BOTTLES DRAWN AEROBIC AND ANAEROBIC Blood Culture adequate volume   Culture PENDING  Incomplete   Report Status PENDING  Incomplete     Labs: BNP (last 3 results) No results for input(s):  BNP in the last 8760 hours. Basic Metabolic Panel:  Recent Labs Lab 01/14/17 1255 01/19/17 1136 01/20/17 0624  NA 133* 136 134*  K 5.1 5.0 4.5  CL 103 108 104  CO2 20* 16* 19*  GLUCOSE 320* 220* 155*  BUN 44* 35* 19  CREATININE 2.25* 1.82* 1.30*  CALCIUM 9.2 9.5 8.7*   Liver Function Tests:  Recent Labs Lab 01/19/17 1136  AST 15  ALT 9*  ALKPHOS 62  BILITOT 0.6  PROT 7.3  ALBUMIN 3.6   CBC:  Recent Labs Lab 01/14/17 1255 01/19/17 1136 01/20/17 0624  WBC 10.9* 16.4* 8.4  NEUTROABS 9.0* 14.3*  --   HGB 10.8* 10.6* 9.5*  HCT 33.1* 32.3* 28.6*  MCV 83.4 82.8 82.9  PLT 428* 559* 459*   CBG:  Recent Labs Lab 01/19/17 1645 01/20/17 0005  GLUCAP 170* 117*   Urinalysis    Component Value Date/Time   COLORURINE STRAW (A) 01/19/2017 1400   APPEARANCEUR CLEAR 01/19/2017 1400   LABSPEC 1.009 01/19/2017 1400   PHURINE 5.0 01/19/2017 1400   GLUCOSEU >=500 (A) 01/19/2017 1400   HGBUR NEGATIVE 01/19/2017 1400   BILIRUBINUR NEGATIVE 01/19/2017 1400   KETONESUR NEGATIVE 01/19/2017 1400   PROTEINUR NEGATIVE 01/19/2017 1400   UROBILINOGEN 0.2 03/16/2015 0120   NITRITE NEGATIVE 01/19/2017 1400   LEUKOCYTESUR NEGATIVE 01/19/2017 1400   Microbiology Recent Results (from the past 240 hour(s))  Blood culture (routine x 2)     Status: None   Collection Time: 01/14/17  1:01 PM  Result Value Ref Range Status   Specimen Description RIGHT ANTECUBITAL  Final   Special Requests   Final    Blood Culture results may not be optimal due to an inadequate volume of blood received in culture bottles   Culture NO GROWTH 5 DAYS  Final   Report Status 01/19/2017 FINAL  Final  Blood Culture (routine x 2)     Status: None (Preliminary result)   Collection Time:  01/19/17 12:18 PM  Result Value Ref Range Status   Specimen Description BLOOD RIGHT ANTECUBITAL  Final   Special Requests   Final    BOTTLES DRAWN AEROBIC AND ANAEROBIC Blood Culture adequate volume   Culture PENDING  Incomplete   Report Status PENDING  Incomplete  Blood Culture (routine x 2)     Status: None (Preliminary result)   Collection Time: 01/19/17 12:18 PM  Result Value Ref Range Status   Specimen Description BLOOD BLOOD RIGHT ARM  Final   Special Requests   Final    BOTTLES DRAWN AEROBIC AND ANAEROBIC Blood Culture adequate volume   Culture PENDING  Incomplete   Report Status PENDING  Incomplete    Time coordinating discharge: Over 30 minutes SIGNED:  Orvan Falconer, MD FACP [facility] 01/20/2017, 1:02 PM   If 7PM-7AM, please contact night-coverage [email or web address] Password [email or web address]

## 2017-01-20 NOTE — Progress Notes (Signed)
PHARMACY NOTE:  ANTIMICROBIAL RENAL DOSAGE ADJUSTMENT  Current antimicrobial regimen includes a mismatch between antimicrobial dosage and estimated renal function.  As per policy approved by the Pharmacy & Therapeutics and Medical Executive Committees, the antimicrobial dosage will be adjusted accordingly.  Current antimicrobial dosage:  Levaquin 750 mg IV every 48 hours  Indication: CAP  Renal Function:  Estimated Creatinine Clearance: 50.1 mL/min (A) (by C-G formula based on SCr of 1.3 mg/dL (H)). []      On intermittent HD, scheduled: []      On CRRT    Antimicrobial dosage has been changed to:  Levaquin 750 mg IV every 24 hours  Additional comments: Renal function has improved   Thank you for allowing pharmacy to be a part of this patient's care.  Gildardo Griffes [place], [facility] 01/20/2017 10:52 AM

## 2017-01-24 LAB — CULTURE, BLOOD (ROUTINE X 2)
CULTURE: NO GROWTH
Culture: NO GROWTH
SPECIAL REQUESTS: ADEQUATE
Special Requests: ADEQUATE

## 2017-01-31 ENCOUNTER — Ambulatory Visit (INDEPENDENT_AMBULATORY_CARE_PROVIDER_SITE_OTHER): Payer: Medicare Other | Admitting: Physician Assistant

## 2017-01-31 ENCOUNTER — Encounter: Payer: Self-pay | Admitting: Physician Assistant

## 2017-01-31 VITALS — BP 126/58 | HR 68 | Ht 67.0 in | Wt 166.0 lb

## 2017-01-31 DIAGNOSIS — E785 Hyperlipidemia, unspecified: Secondary | ICD-10-CM | POA: Diagnosis not present

## 2017-01-31 DIAGNOSIS — I959 Hypotension, unspecified: Secondary | ICD-10-CM | POA: Diagnosis not present

## 2017-01-31 DIAGNOSIS — I25810 Atherosclerosis of coronary artery bypass graft(s) without angina pectoris: Secondary | ICD-10-CM | POA: Diagnosis not present

## 2017-01-31 DIAGNOSIS — N179 Acute kidney failure, unspecified: Secondary | ICD-10-CM

## 2017-01-31 DIAGNOSIS — Z794 Long term (current) use of insulin: Secondary | ICD-10-CM

## 2017-01-31 DIAGNOSIS — R002 Palpitations: Secondary | ICD-10-CM

## 2017-01-31 DIAGNOSIS — I10 Essential (primary) hypertension: Secondary | ICD-10-CM | POA: Diagnosis not present

## 2017-01-31 DIAGNOSIS — E119 Type 2 diabetes mellitus without complications: Secondary | ICD-10-CM | POA: Diagnosis not present

## 2017-01-31 DIAGNOSIS — I739 Peripheral vascular disease, unspecified: Secondary | ICD-10-CM

## 2017-01-31 NOTE — Progress Notes (Signed)
Cardiology Office Note    Date:  01/31/2017   ID:  [name], DOB 23-May-1946, MRN [id number]  PCP:  [name], MD  Cardiologist:  Dr. Gwenlyn Found   Chief Complaint  Patient presents with  . Follow-up    seen for Dr. Gwenlyn Found    History of Present Illness:  [name] is a 71 y.o. male with PMH of HTN, HLD, PAD s/p R-L fem-fem bypass by Dr. Amedeo Plenty, DM II and CAD s/p CABG. He had a left circumflex stenting in 2003. Last cardiac catheterization on 10/11/2015 revealed three-vessel CAD, he ultimately underwent bypass graft on 10/18/2015 by Dr. Roxan Hockey with LIMA to LAD, SVG to diagonal, SVG to OM1-OM 2 and SVG to distal RCA. Due to abnormal ABI, he underwent peripheral angiography on 05/08/2016 revealing patent right-to-left femoral-femoral bypass graft, patent right iliac stent, occluded SFA bilaterally with high-grade 95% calcified lesion in right common femoral artery proximal to the femorofemoral anastomosis. He underwent right iliofemoral atherectomy by Dr. Trula Slade on 08/09/2016, unfortunately his postprocedure Doppler showed worsening of the claudication. He eventually underwent stenting of the focal high-grade stenosis in the right external iliac artery by Dr. Trula Slade on 09/19/2016. He is due for repeat ultrasound in September before his visit with vascular surgery.  I last saw the patient on 01/16/2017 for evaluation of palpitation. He was previously treated for pneumonia on 01/14/2017. He received IV Zithromax in the ED and discharged on azithromycin. Lab work also showed worsening renal function as well. He his previous baseline creatinine was 1.2. Creatinine increased to 1.6 in February and up to 2.25 in the ED on 01/14/2017. On the office visit with me, his blood pressure was low at 90/50. I was concerned that his worsening renal function is related to hypotension related to pneumonia. I discontinued his lisinopril. I also decreased his metoprolol tartrate by half. My recommendation is to delay  evaluation of palpitation until he recovered from pneumonia as the infection itself can cause similar issues. Unfortunately, he was admitted to the hospital on 01/19/2017 with sepsis, it appears his pneumonia also worsened. He had an elevated lactic acid of 2.4 and the creatinine was elevated at 1.8. White blood cell count was 19,000. He was started on Vanco and cefepime and was admitted overnight. He was given IV fluid, his creatinine also improved to 1.3.   He presents today for follow-up, since his treatment of pneumonia, he has felt much better. His blood pressure also improved. He is no longer having the thumping sensation in his chest. I think it is likely related to recent pneumonia, I did not recommend any further workup. He does not look volume overloaded on physical exam. He his lung is clear. He denies any chest discomfort or shortness of breath. O2 saturation in the office was 100%.   Past Medical History:  Diagnosis Date  . CAD S/P percutaneous coronary angioplasty 1998   PCI TO CX  . Diabetes mellitus without complication ([place])   . GERD (gastroesophageal reflux disease)   . 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 Hypercholesteremia   . Hypertension   . Neuropathy   . Peripheral vascular disease ([place])    s/p R-L FEM-FEM BYPASS  . Tachycardia    after CABG, pt on medicine for this    Past Surgical History:  Procedure Laterality Date  . ABDOMINAL AORTOGRAM Bilateral 09/19/2016   Procedure: iliac;  Surgeon: Serafina Mitchell, MD;  Location: [facility];  Service:  Cardiovascular;  Laterality: Bilateral;  . CARDIAC CATHETERIZATION  2003   with stent  . CARDIAC CATHETERIZATION N/A 10/11/2015   Procedure: Left Heart Cath and Coronary Angiography;  Surgeon: Peter M Martinique, MD;  Location: [facility];  Service: Cardiovascular;  Laterality: N/A;  . COLONOSCOPY N/A 09/22/2013   Procedure: COLONOSCOPY;  Surgeon: Daneil Dolin, MD;  [facility];  Service: Endoscopy;   Laterality: N/A;  9:30 AM  . CORONARY ARTERY BYPASS GRAFT N/A 10/18/2015   Procedure: CORONARY ARTERY BYPASS GRAFTING (CABG) x  five, using left internal mammary artery and right leg greater saphenous vein harvested endoscopically;  Surgeon: [name], MD;  Location: [facility];  Service: Open Heart Surgery;  Laterality: N/A;  . ENDARTERECTOMY FEMORAL Right 08/09/2016   Procedure: ENDARTERECTOMY FEMORAL WITH VEIN PATCH ANGIOPLASTY;  Surgeon: Serafina Mitchell, MD;  Location: [facility];  Service: Vascular;  Laterality: Right;  . FEMORAL-FEMORAL BYPASS GRAFT Bilateral 08/09/2016   Procedure: REVISION BYPASS GRAFT RIGHT FEMORAL-LEFT FEMORAL ARTERY;  Surgeon: Serafina Mitchell, MD;  Location: [facility];  Service: Vascular;  Laterality: Bilateral;  . FEMORAL-POPLITEAL BYPASS GRAFT    . PERIPHERAL VASCULAR CATHETERIZATION N/A 05/08/2016   Procedure: Lower Extremity Angiography;  Surgeon: Lorretta Harp, MD;  Location: [facility];  Service: Cardiovascular;  Laterality: N/A;  . PERIPHERAL VASCULAR INTERVENTION Right 09/19/2016   Procedure: Peripheral Vascular Intervention;  Surgeon: Serafina Mitchell, MD;  Location: [facility];  Service: Cardiovascular;  Laterality: Right;  ext iliac stent  . TEE WITHOUT CARDIOVERSION N/A 10/18/2015   Procedure: TRANSESOPHAGEAL ECHOCARDIOGRAM (TEE);  Surgeon: [name], MD;  Location: [facility];  Service: Open Heart Surgery;  Laterality: N/A;    Current Medications: Outpatient Medications Prior to Visit  Medication Sig Dispense Refill  . amLODipine (NORVASC) 10 MG tablet Take 10 mg by mouth daily.    [name] aspirin 81 MG tablet Take 81 mg by mouth daily.    . Cholecalciferol (VITAMIN D) 2000 UNITS tablet Take 2,000 Units by mouth daily.    . cilostazol (PLETAL) 100 MG tablet Take 100 mg by mouth 2 (two) times daily.    . ferrous sulfate 325 (65 FE) MG tablet Take 325-650 mg by mouth 2 (two) times daily with a meal. 650mg  in the morning and 325mg  in the evening      . gabapentin (NEURONTIN) 300 MG capsule Take 300 mg by mouth 3 (three) times daily.     . insulin glargine (LANTUS) 100 UNIT/ML injection Give 10 units before bedtime each day. 10 mL 3  . metoprolol tartrate (LOPRESSOR) 25 MG tablet Take 1 tablet (25 mg total) by mouth 2 (two) times daily. 180 tablet 3  . pantoprazole (PROTONIX) 40 MG tablet Take 40 mg by mouth daily.    . simvastatin (ZOCOR) 40 MG tablet Take 20 mg by mouth at bedtime.      No facility-administered medications prior to visit.      Allergies:   No known allergies   Social History   Social History  . Marital status: Married    Spouse name: N/A  . Number of children: N/A  . Years of education: N/A   Social History Main Topics  . Smoking status: Former Smoker    Packs/day: 2.00    Years: 30.00    Types: Cigarettes    Quit date: 08/14/2000  . Smokeless tobacco: Never Used  . Alcohol use No  . Drug use: No  .  Sexual activity: Not Asked   Other Topics Concern  . None   Social History Narrative  . None     Family History:  The patient's family history includes Heart attack in his mother; Stroke in his mother.   ROS:   Please see the history of present illness.    ROS All other systems reviewed and are negative.   PHYSICAL EXAM:   VS:  BP (!) 126/58   Pulse 68   Ht 5\' 7"  (1.702 m)   Wt 166 lb (75.3 kg)   SpO2 100%   BMI 26.00 kg/m    GEN: Well nourished, well developed, in no acute distress  HEENT: normal  Neck: no JVD, carotid bruits, or masses Cardiac: RRR; no murmurs, rubs, or gallops,no edema  Respiratory:  clear to auscultation bilaterally, normal work of breathing GI: soft, nontender, nondistended, + BS MS: no deformity or atrophy  Skin: warm and dry, no rash Neuro:  Alert and Oriented x 3, Strength and sensation are intact Psych: euthymic mood, full affect  Wt Readings from Last 3 Encounters:  01/31/17 166 lb (75.3 kg)  01/19/17 166 lb (75.3 kg)  01/16/17 161 lb 6.4 oz (73.2 kg)       Studies/Labs Reviewed:   EKG:  EKG is not ordered today.    Recent Labs: 01/19/2017: ALT 9 01/20/2017: BUN 19; Creatinine, Ser 1.30; Hemoglobin 9.5; Platelets 459; Potassium 4.5; Sodium 134   Lipid Panel    Component Value Date/Time   CHOL 110 10/11/2015 0536   TRIG 135 10/11/2015 0536   HDL 27 (L) 10/11/2015 0536   CHOLHDL 4.1 10/11/2015 0536   VLDL 27 10/11/2015 0536   LDLCALC 56 10/11/2015 0536    Additional studies/ records that were reviewed today include:   CT of Chest WO contrast 01/19/2017 IMPRESSION: Bilateral upper lobe and left lower lobe airspace disease most consistent with pneumonia. The appearance is worse than on the 01/14/2017 plain films of the chest.  No change in 0.3 cm nodule along the minor fissure likely representing a lymph node.  Atherosclerosis.   ASSESSMENT:    1. Palpitation   2. Hypotension, unspecified hypotension type   3. Coronary artery disease involving coronary bypass graft of native heart without angina pectoris   4. Essential hypertension   5. Hyperlipidemia, unspecified hyperlipidemia type   6. PAD (peripheral artery disease) ([place])   7. Controlled type 2 diabetes mellitus without complication, with long-term current use of insulin ([place])   8. AKI (acute kidney injury) ([place])      PLAN:  In order of problems listed above:  1. Palpitation: Described as a thumping sensation instead of tachycardia palpitation. Symptoms largely resolved after treatment of pneumonia. Since I saw him 2 weeks ago, I recommended hold off on workup until after pneumonia has resolved. Since he has not had any recurrence after he was treated with pneumonia. I would not recommend a heart monitor at this time.  2. Hypotension: Resolved. I discontinued his lisinopril and cut back on metoprolol. He was back to the hospital again with pneumonia with possible sepsis, he was treated with IV antibiotic. He left the hospital after 1 day of admission. But  fortunately, it appears he has recovered quite well since then.  3. AKI: Recent acute kidney injury has resolved. Creatinine peaked at 2.25 in early June compared to 1.5 last year. Since discontinuation of lisinopril, improvement of blood pressure and discontinuation of metformin, his renal function has improved during recent hospitalization after hydration.  4. CAD s/p CABG: No obvious angina recently.  5. Hyperlipidemia: Continue on Zocor 20 mg daily.  6. DM 2: On insulin, metformin was recently discontinued due to acute kidney injury with creatinine greater than 1.5. During the recent hospitalization, there was a urinalysis that showed > 500 glucose in the urine.    Medication Adjustments/Labs and Tests Ordered: Current medicines are reviewed at length with the patient today.  Concerns regarding medicines are outlined above.  Medication changes, Labs and Tests ordered today are listed in the Patient Instructions below. Patient Instructions  Medication Instructions: No changes  Follow-Up: Your physician wants you to follow-up in: 6 months with Dr.Berry. You will receive a reminder letter in the mail two months in advance. If you don't receive a letter, please call our office to schedule the follow-up appointment.    If you need a refill on your cardiac medications before your next appointment, please call your pharmacy.      Hilbert Corrigan, [place]  01/31/2017 11:58 AM    [address], [place],   [address] Phone: [phone]; Fax: [phone]

## 2017-01-31 NOTE — Patient Instructions (Signed)
Medication Instructions: No changes  Follow-Up: Your physician wants you to follow-up in: 6 months with Dr.Berry. You will receive a reminder letter in the mail two months in advance. If you don't receive a letter, please call our office to schedule the follow-up appointment.    If you need a refill on your cardiac medications before your next appointment, please call your pharmacy.

## 2017-03-09 ENCOUNTER — Encounter: Payer: Self-pay | Admitting: Cardiovascular Disease

## 2017-03-09 ENCOUNTER — Ambulatory Visit (INDEPENDENT_AMBULATORY_CARE_PROVIDER_SITE_OTHER): Payer: Medicare Other | Admitting: Cardiovascular Disease

## 2017-03-09 DIAGNOSIS — I10 Essential (primary) hypertension: Secondary | ICD-10-CM | POA: Diagnosis not present

## 2017-03-09 DIAGNOSIS — Z951 Presence of aortocoronary bypass graft: Secondary | ICD-10-CM

## 2017-03-09 DIAGNOSIS — I739 Peripheral vascular disease, unspecified: Secondary | ICD-10-CM | POA: Diagnosis not present

## 2017-03-09 DIAGNOSIS — E7800 Pure hypercholesterolemia, unspecified: Secondary | ICD-10-CM

## 2017-03-09 NOTE — Assessment & Plan Note (Signed)
History of essential hypertension blood pressure measured 130/60. He is on amlodipine, metoprolol. Continue current meds at current dosing

## 2017-03-09 NOTE — Assessment & Plan Note (Signed)
History of CAD status post bypass grafting 10/18/15 by Dr. Roxan Hockey with a LIMA to the LAD,, vein graft to a diagonal branch, obtuse marginal branches 1 and 2 sequentially and distal RCA. He denies chest pain or shortness of breath.

## 2017-03-09 NOTE — Assessment & Plan Note (Signed)
History of hyperlipidemia on statin therapy followed at [facility]. 

## 2017-03-09 NOTE — Assessment & Plan Note (Signed)
History of peripheral arterial disease status post right to left femoral femoral bypass grafting by Dr. Amedeo Plenty in the 90s. I stented his right lower extremity multiple times. Because of Dopplers that revealed high-grade right external iliac artery stenosis with occluded SFAs bilaterally in the occluded femorofemoral bypass graft he underwent angiography by myself 05/08/16 revealing a patent right to left femorofemoral bypass graft, patent right iliac stent and occluded SFAs with a 95% calcified right common femoral artery stenosis just proximal to the femorofemoral anastomosis. He underwent right iliofemoral endarterectomy by Dr. Trula Slade 08/09/16 however postprocedure his Doppler showed worsening claudication. He was restudied by Dr. Trula Slade 2/76/18 revealing a high-grade right external iliac artery stenosis which was successfully stented and his symptoms subsequently improved.

## 2017-03-09 NOTE — Patient Instructions (Signed)
Medication Instructions: Your physician recommends that you continue on your current medications as directed. Please refer to the Current Medication list given to you today.  Labwork: Please have your recent labwork from the [place] faxed to [phone] ATTN: Dr. Gwenlyn Found  Follow-Up: We request that you follow-up in: 6 months with Almyra Deforest, PA and in 12 months with Dr Andria Rhein will receive a reminder letter in the mail two months in advance. If you don't receive a letter, please call our office to schedule the follow-up appointment.  If you need a refill on your cardiac medications before your next appointment, please call your pharmacy.

## 2017-03-09 NOTE — Progress Notes (Signed)
03/09/2017 ARUN HERROD   10-Mar-1946  [id number]  Primary Physician [name], MD Primary Cardiologist: Lorretta Harp MD Garret Reddish, [place], [place]  HPI:  [name] is a 71 y.o. male  married Caucasian male father of 2 children who I took care of for years and last saw in the office 06/07/16. Accompanied by his wife today. He has a history of CAD status post circumflex stenting by myself in 2003. He also has a history of peripheral vascular disease status post grafting of his lower extremities by Dr. Amedeo Plenty in the 90s. He had right to left femorofemoral crossover grafting. I performed stenting of his right lower extremity multiple times. He stopped smoking remotely. His cardiovascular risk factor profile includes treated hypertension, hyperlipidemia and type 2 diabetes. He was admitted in February with unstable angina and underwent cardiac catheterization by Dr. Martinique 10/11/15 revealing three-vessel disease. He ultimately underwent coronary artery bypass grafting on 10/18/15 by Dr. Roxan Hockey is a LIMA to his LAD, vein to diagonal branch, obtuse marginal branches 1 and 2 and the distal RCA. His postop course was unremarkable. He recently had Doppler studies revealing an occluded left iliac with high-grade right external iliac artery stenosis, occluded SFAs bilaterally with occluded left femoropopliteal bypass graft and a patent right to left femorofemoral crossover graft.. His right ABI was 0.6 by the left was 0.88. He was more symptomatic on the right and does have lifestyle limiting claudication. He underwent peripheral angiography by myself 05/08/16 revealing a patent right to left femorofemoral crossover graft, patent right iliac stent, occluded SFAs bilaterally with a high-grade 95% calcified right common femoral stenosis just proximal to the femorofemoral anastomosis. He also underwent right iliofemoral endarterectomy by Dr. Trula Slade 08/09/16 however his postprocedure Dopplers actually showed  worsening of his claudication is worse as well. He underwent peripheral angiography by Dr. Trula Slade 09/19/16 revealing a high-grade right external iliac artery stenosis which she ultimately stented. This resulted in marked improvement in his claudication symptoms.  He was readmitted in June with pneumonia and palpitations. He did see Almyra Deforest PA-C in the office cause of this and he was somewhat hypotensive. He was readmitted to the hospital because of sepsis and this was appropriately treated. He saw Almyra Deforest PA-C in the office 01/31/17 after being treated for sepsis as blood pressure had improved and his palpitations had resolved.   Current Meds  Medication Sig  . amLODipine (NORVASC) 10 MG tablet Take 10 mg by mouth daily.  [name] aspirin 81 MG tablet Take 81 mg by mouth daily.  . Cholecalciferol (VITAMIN D) 2000 UNITS tablet Take 2,000 Units by mouth daily.  . cilostazol (PLETAL) 100 MG tablet Take 100 mg by mouth 2 (two) times daily.  . ferrous sulfate 325 (65 FE) MG tablet Take 325-650 mg by mouth 2 (two) times daily with a meal. 650mg  in the morning and 325mg  in the evening   . gabapentin (NEURONTIN) 300 MG capsule Take 300 mg by mouth 3 (three) times daily.   . insulin glargine (LANTUS) 100 UNIT/ML injection Give 10 units before bedtime each day.  . metoprolol tartrate (LOPRESSOR) 25 MG tablet Take 1 tablet (25 mg total) by mouth 2 (two) times daily.  . pantoprazole (PROTONIX) 40 MG tablet Take 40 mg by mouth daily.  . simvastatin (ZOCOR) 40 MG tablet Take 20 mg by mouth at bedtime.      Allergies  Allergen Reactions  . No Known Allergies     Social History  Social History  . Marital status: Married    Spouse name: N/A  . Number of children: N/A  . Years of education: N/A   Occupational History  . Not on file.   Social History Main Topics  . Smoking status: Former Smoker    Packs/day: 2.00    Years: 30.00    Types: Cigarettes    Quit date: 08/14/2000  . Smokeless tobacco: Never  Used  . Alcohol use No  . Drug use: No  . Sexual activity: Not on file   Other Topics Concern  . Not on file   Social History Narrative  . No narrative on file     Review of Systems: General: negative for chills, fever, night sweats or weight changes.  Cardiovascular: negative for chest pain, dyspnea on exertion, edema, orthopnea, palpitations, paroxysmal nocturnal dyspnea or shortness of breath Dermatological: negative for rash Respiratory: negative for cough or wheezing Urologic: negative for hematuria Abdominal: negative for nausea, vomiting, diarrhea, bright red blood per rectum, melena, or hematemesis Neurologic: negative for visual changes, syncope, or dizziness All other systems reviewed and are otherwise negative except as noted above.    Blood pressure 130/60, pulse 76, height 5\' 7"  (1.702 m), weight 158 lb (71.7 kg).  General appearance: alert and no distress Neck: no adenopathy, no carotid bruit, no JVD, supple, symmetrical, trachea midline and thyroid not enlarged, symmetric, no tenderness/mass/nodules Lungs: clear to auscultation bilaterally Heart: regular rate and rhythm, S1, S2 normal, no murmur, click, rub or gallop Extremities: extremities normal, atraumatic, no cyanosis or edema  EKG not performed today  ASSESSMENT AND PLAN:   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 History of CAD status post bypass grafting 10/18/15 by Dr. Roxan Hockey with a LIMA to the LAD,, vein graft to a diagonal branch, obtuse marginal branches 1 and 2 sequentially and distal RCA. He denies chest pain or shortness of breath.  PVD (peripheral vascular disease) ([place]) History of peripheral arterial disease status post right to left femoral femoral bypass grafting by Dr. Amedeo Plenty in the 90s. I stented his right lower extremity multiple times. Because of Dopplers that revealed high-grade right external iliac artery stenosis with occluded SFAs bilaterally in the occluded femorofemoral bypass graft he  underwent angiography by myself 05/08/16 revealing a patent right to left femorofemoral bypass graft, patent right iliac stent and occluded SFAs with a 95% calcified right common femoral artery stenosis just proximal to the femorofemoral anastomosis. He underwent right iliofemoral endarterectomy by Dr. Trula Slade 08/09/16 however postprocedure his Doppler showed worsening claudication. He was restudied by Dr. Trula Slade 2/76/18 revealing a high-grade right external iliac artery stenosis which was successfully stented and his symptoms subsequently improved.  Essential hypertension History of essential hypertension blood pressure measured 130/60. He is on amlodipine, metoprolol. Continue current meds at current dosing  HLD (hyperlipidemia) History of hyperlipidemia on statin therapy followed at the [facility].      Lorretta Harp MD FACP,FACC,FAHA, [facility] 03/09/2017 9:39 AM

## 2017-03-09 NOTE — Progress Notes (Deleted)
Cardiology Office Note    Date:  03/09/2017   ID:  [name], DOB [birth year], MRN [id number]  PCP:  [name], MD  Cardiologist: Quay Burow, MD   No chief complaint on file.   History of Present Illness:  [name] is a 71 y.o. male ***    Past Medical History:  Diagnosis Date  . CAD S/P percutaneous coronary angioplasty 1998   PCI TO CX  . Diabetes mellitus without complication ([place])   . GERD (gastroesophageal reflux disease)   . 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 Hypercholesteremia   . Hypertension   . Neuropathy   . Peripheral vascular disease ([place])    s/p R-L FEM-FEM BYPASS  . Tachycardia    after CABG, pt on medicine for this    Past Surgical History:  Procedure Laterality Date  . ABDOMINAL AORTOGRAM Bilateral 09/19/2016   Procedure: iliac;  Surgeon: Serafina Mitchell, MD;  Location: [facility];  Service: Cardiovascular;  Laterality: Bilateral;  . CARDIAC CATHETERIZATION  2003   with stent  . CARDIAC CATHETERIZATION N/A 10/11/2015   Procedure: Left Heart Cath and Coronary Angiography;  Surgeon: Peter M Martinique, MD;  Location: [facility];  Service: Cardiovascular;  Laterality: N/A;  . COLONOSCOPY N/A 09/22/2013   Procedure: COLONOSCOPY;  Surgeon: Daneil Dolin, MD;  [facility];  Service: Endoscopy;  Laterality: N/A;  9:30 AM  . CORONARY ARTERY BYPASS GRAFT N/A 10/18/2015   Procedure: CORONARY ARTERY BYPASS GRAFTING (CABG) x  five, using left internal mammary artery and right leg greater saphenous vein harvested endoscopically;  Surgeon: [name], MD;  Location: [facility];  Service: Open Heart Surgery;  Laterality: N/A;  . ENDARTERECTOMY FEMORAL Right 08/09/2016   Procedure: ENDARTERECTOMY FEMORAL WITH VEIN PATCH ANGIOPLASTY;  Surgeon: Serafina Mitchell, MD;  Location: [facility];  Service: Vascular;  Laterality: Right;  . FEMORAL-FEMORAL BYPASS GRAFT Bilateral 08/09/2016   Procedure: REVISION BYPASS GRAFT RIGHT FEMORAL-LEFT  FEMORAL ARTERY;  Surgeon: Serafina Mitchell, MD;  Location: [facility];  Service: Vascular;  Laterality: Bilateral;  . FEMORAL-POPLITEAL BYPASS GRAFT    . PERIPHERAL VASCULAR CATHETERIZATION N/A 05/08/2016   Procedure: Lower Extremity Angiography;  Surgeon: Lorretta Harp, MD;  Location: [facility];  Service: Cardiovascular;  Laterality: N/A;  . PERIPHERAL VASCULAR INTERVENTION Right 09/19/2016   Procedure: Peripheral Vascular Intervention;  Surgeon: Serafina Mitchell, MD;  Location: [facility];  Service: Cardiovascular;  Laterality: Right;  ext iliac stent  . TEE WITHOUT CARDIOVERSION N/A 10/18/2015   Procedure: TRANSESOPHAGEAL ECHOCARDIOGRAM (TEE);  Surgeon: [name], MD;  Location: [facility];  Service: Open Heart Surgery;  Laterality: N/A;    Current Medications: Outpatient Medications Prior to Visit  Medication Sig Dispense Refill  . amLODipine (NORVASC) 10 MG tablet Take 10 mg by mouth daily.    [name] aspirin 81 MG tablet Take 81 mg by mouth daily.    . Cholecalciferol (VITAMIN D) 2000 UNITS tablet Take 2,000 Units by mouth daily.    . cilostazol (PLETAL) 100 MG tablet Take 100 mg by mouth 2 (two) times daily.    . ferrous sulfate 325 (65 FE) MG tablet Take 325-650 mg by mouth 2 (two) times daily with a meal. 650mg  in the morning and 325mg  in the evening     . gabapentin (NEURONTIN) 300 MG capsule Take 300 mg by mouth 3 (three) times daily.     [name]  insulin glargine (LANTUS) 100 UNIT/ML injection Give 10 units before bedtime each day. 10 mL 3  . metoprolol tartrate (LOPRESSOR) 25 MG tablet Take 1 tablet (25 mg total) by mouth 2 (two) times daily. 180 tablet 3  . pantoprazole (PROTONIX) 40 MG tablet Take 40 mg by mouth daily.    . simvastatin (ZOCOR) 40 MG tablet Take 20 mg by mouth at bedtime.      No facility-administered medications prior to visit.      Allergies:   No known allergies   Social History   Social History  . Marital status: Married    Spouse name: N/A  .  Number of children: N/A  . Years of education: N/A   Social History Main Topics  . Smoking status: Former Smoker    Packs/day: 2.00    Years: 30.00    Types: Cigarettes    Quit date: 08/14/2000  . Smokeless tobacco: Never Used  . Alcohol use No  . Drug use: No  . Sexual activity: Not on file   Other Topics Concern  . Not on file   Social History Narrative  . No narrative on file     Family History:  The patient's ***family history includes Heart attack in his mother; Stroke in his mother.   ROS:   Please see the history of present illness.    ROS All other systems reviewed and are negative.   PHYSICAL EXAM:   VS:  There were no vitals taken for this visit.   GEN: Well nourished, well developed, in no acute distress HEENT: normal Neck: no JVD, carotid bruits, or masses Cardiac: ***RRR; no murmurs, rubs, or gallops,no edema  Respiratory:  clear to auscultation bilaterally, normal work of breathing GI: soft, nontender, nondistended, + BS MS: no deformity or atrophy Skin: warm and dry, no rash Neuro:  Alert and Oriented x 3, Strength and sensation are intact Psych: euthymic mood, full affect  Wt Readings from Last 3 Encounters:  01/31/17 166 lb (75.3 kg)  01/19/17 166 lb (75.3 kg)  01/16/17 161 lb 6.4 oz (73.2 kg)      Studies/Labs Reviewed:   EKG:  EKG is*** ordered today.  The ekg ordered today demonstrates ***  Recent Labs: 01/19/2017: ALT 9 01/20/2017: BUN 19; Creatinine, Ser 1.30; Hemoglobin 9.5; Platelets 459; Potassium 4.5; Sodium 134   Lipid Panel    Component Value Date/Time   CHOL 110 10/11/2015 0536   TRIG 135 10/11/2015 0536   HDL 27 (L) 10/11/2015 0536   CHOLHDL 4.1 10/11/2015 0536   VLDL 27 10/11/2015 0536   LDLCALC 56 10/11/2015 0536    Additional studies/ records that were reviewed today include:  ***    ASSESSMENT:    No diagnosis found.   PLAN:  In order of problems listed above:  1. ***    Medication Adjustments/Labs and  Tests Ordered: Current medicines are reviewed at length with the patient today.  Concerns regarding medicines are outlined above.  Medication changes, Labs and Tests ordered today are listed in the Patient Instructions below. There are no Patient Instructions on file for this visit.   Angelina Sheriff, MD  03/09/2017 9:06 AM    [address], [place], [place]  [address] Phone: [phone]; Fax: [phone]

## 2017-03-09 NOTE — Progress Notes (Deleted)
     03/09/2017 [name]   January 24, 1946  [id number]  Primary Physician [name], MD Primary Cardiologist: Lorretta Harp MD Renae Gloss  HPI:  ***   Current Outpatient Prescriptions  Medication Sig Dispense Refill  . amLODipine (NORVASC) 10 MG tablet Take 10 mg by mouth daily.    [name] aspirin 81 MG tablet Take 81 mg by mouth daily.    . Cholecalciferol (VITAMIN D) 2000 UNITS tablet Take 2,000 Units by mouth daily.    . cilostazol (PLETAL) 100 MG tablet Take 100 mg by mouth 2 (two) times daily.    . ferrous sulfate 325 (65 FE) MG tablet Take 325-650 mg by mouth 2 (two) times daily with a meal. 650mg  in the morning and 325mg  in the evening     . gabapentin (NEURONTIN) 300 MG capsule Take 300 mg by mouth 3 (three) times daily.     . insulin glargine (LANTUS) 100 UNIT/ML injection Give 10 units before bedtime each day. 10 mL 3  . metoprolol tartrate (LOPRESSOR) 25 MG tablet Take 1 tablet (25 mg total) by mouth 2 (two) times daily. 180 tablet 3  . pantoprazole (PROTONIX) 40 MG tablet Take 40 mg by mouth daily.    . simvastatin (ZOCOR) 40 MG tablet Take 20 mg by mouth at bedtime.      No current facility-administered medications for this visit.     Allergies  Allergen Reactions  . No Known Allergies     Social History   Social History  . Marital status: Married    Spouse name: N/A  . Number of children: N/A  . Years of education: N/A   Occupational History  . Not on file.   Social History Main Topics  . Smoking status: Former Smoker    Packs/day: 2.00    Years: 30.00    Types: Cigarettes    Quit date: 08/14/2000  . Smokeless tobacco: Never Used  . Alcohol use No  . Drug use: No  . Sexual activity: Not on file   Other Topics Concern  . Not on file   Social History Narrative  . No narrative on file     Review of Systems: General: negative for chills, fever, night sweats or weight changes.  Cardiovascular: negative for chest pain, dyspnea on  exertion, edema, orthopnea, palpitations, paroxysmal nocturnal dyspnea or shortness of breath Dermatological: negative for rash Respiratory: negative for cough or wheezing Urologic: negative for hematuria Abdominal: negative for nausea, vomiting, diarrhea, bright red blood per rectum, melena, or hematemesis Neurologic: negative for visual changes, syncope, or dizziness All other systems reviewed and are otherwise negative except as noted above.    There were no vitals taken for this visit.  {Physical LXBW:[id number]}  EKG ***  ASSESSMENT AND PLAN:   No problem-specific Assessment & Plan notes found for this encounter.      Lorretta Harp MD FACP,FACC,FAHA, [facility] 03/09/2017 9:06 AM

## 2017-04-26 ENCOUNTER — Emergency Department (HOSPITAL_COMMUNITY): Payer: Medicare Other

## 2017-04-26 ENCOUNTER — Emergency Department (HOSPITAL_COMMUNITY)
Admission: EM | Admit: 2017-04-26 | Discharge: 2017-04-26 | Disposition: A | Discharge status: Home / Self Care | Payer: Medicare Other | Attending: Emergency Medicine | Admitting: Emergency Medicine

## 2017-04-26 ENCOUNTER — Encounter (HOSPITAL_COMMUNITY): Payer: Self-pay | Admitting: Emergency Medicine

## 2017-04-26 DIAGNOSIS — R410 Disorientation, unspecified: Secondary | ICD-10-CM | POA: Insufficient documentation

## 2017-04-26 DIAGNOSIS — J189 Pneumonia, unspecified organism: Secondary | ICD-10-CM | POA: Insufficient documentation

## 2017-04-26 DIAGNOSIS — G8929 Other chronic pain: Secondary | ICD-10-CM | POA: Diagnosis not present

## 2017-04-26 DIAGNOSIS — Z7982 Long term (current) use of aspirin: Secondary | ICD-10-CM | POA: Insufficient documentation

## 2017-04-26 DIAGNOSIS — R51 Headache: Secondary | ICD-10-CM | POA: Diagnosis not present

## 2017-04-26 DIAGNOSIS — R413 Other amnesia: Secondary | ICD-10-CM | POA: Diagnosis not present

## 2017-04-26 DIAGNOSIS — N182 Chronic kidney disease, stage 2 (mild): Secondary | ICD-10-CM | POA: Insufficient documentation

## 2017-04-26 DIAGNOSIS — I129 Hypertensive chronic kidney disease with stage 1 through stage 4 chronic kidney disease, or unspecified chronic kidney disease: Secondary | ICD-10-CM | POA: Diagnosis not present

## 2017-04-26 DIAGNOSIS — Z794 Long term (current) use of insulin: Secondary | ICD-10-CM | POA: Insufficient documentation

## 2017-04-26 DIAGNOSIS — Z87891 Personal history of nicotine dependence: Secondary | ICD-10-CM | POA: Diagnosis not present

## 2017-04-26 DIAGNOSIS — Z79899 Other long term (current) drug therapy: Secondary | ICD-10-CM | POA: Insufficient documentation

## 2017-04-26 DIAGNOSIS — E1140 Type 2 diabetes mellitus with diabetic neuropathy, unspecified: Secondary | ICD-10-CM | POA: Diagnosis not present

## 2017-04-26 DIAGNOSIS — I2510 Atherosclerotic heart disease of native coronary artery without angina pectoris: Secondary | ICD-10-CM | POA: Insufficient documentation

## 2017-04-26 DIAGNOSIS — R519 Headache, unspecified: Secondary | ICD-10-CM

## 2017-04-26 DIAGNOSIS — E1122 Type 2 diabetes mellitus with diabetic chronic kidney disease: Secondary | ICD-10-CM | POA: Insufficient documentation

## 2017-04-26 LAB — I-STAT CHEM 8, ED
BUN: 24 mg/dL — ABNORMAL HIGH (ref 6–20)
Calcium, Ion: 1.08 mmol/L — ABNORMAL LOW (ref 1.15–1.40)
Chloride: 98 mmol/L — ABNORMAL LOW (ref 101–111)
Creatinine, Ser: 1.4 mg/dL — ABNORMAL HIGH (ref 0.61–1.24)
Glucose, Bld: 137 mg/dL — ABNORMAL HIGH (ref 65–99)
HEMATOCRIT: 43 % (ref 39.0–52.0)
HEMOGLOBIN: 14.6 g/dL (ref 13.0–17.0)
POTASSIUM: 3.8 mmol/L (ref 3.5–5.1)
Sodium: 135 mmol/L (ref 135–145)
TCO2: 23 mmol/L (ref 22–32)

## 2017-04-26 LAB — COMPREHENSIVE METABOLIC PANEL
ALBUMIN: 3.6 g/dL (ref 3.5–5.0)
ALT: 11 U/L — AB (ref 17–63)
AST: 13 U/L — ABNORMAL LOW (ref 15–41)
Alkaline Phosphatase: 68 U/L (ref 38–126)
Anion gap: 11 (ref 5–15)
BUN: 23 mg/dL — ABNORMAL HIGH (ref 6–20)
CHLORIDE: 98 mmol/L — AB (ref 101–111)
CO2: 24 mmol/L (ref 22–32)
Calcium: 8.8 mg/dL — ABNORMAL LOW (ref 8.9–10.3)
Creatinine, Ser: 1.44 mg/dL — ABNORMAL HIGH (ref 0.61–1.24)
GFR calc non Af Amer: 48 mL/min — ABNORMAL LOW (ref >60)
GFR, EST AFRICAN AMERICAN: 56 mL/min — AB (ref >60)
Glucose, Bld: 139 mg/dL — ABNORMAL HIGH (ref 65–99)
Potassium: 3.7 mmol/L (ref 3.5–5.1)
SODIUM: 133 mmol/L — AB (ref 135–145)
Total Bilirubin: 0.6 mg/dL (ref 0.3–1.2)
Total Protein: 6.8 g/dL (ref 6.5–8.1)

## 2017-04-26 LAB — CBC
HCT: 41.6 % (ref 39.0–52.0)
Hemoglobin: 13.7 g/dL (ref 13.0–17.0)
MCH: 28.7 pg (ref 26.0–34.0)
MCHC: 32.9 g/dL (ref 30.0–36.0)
MCV: 87 fL (ref 78.0–100.0)
PLATELETS: 318 10*3/uL (ref 150–400)
RBC: 4.78 MIL/uL (ref 4.22–5.81)
RDW: 15 % (ref 11.5–15.5)
WBC: 14.5 10*3/uL — AB (ref 4.0–10.5)

## 2017-04-26 LAB — RAPID URINE DRUG SCREEN, HOSP PERFORMED
Amphetamines: NOT DETECTED
BENZODIAZEPINES: NOT DETECTED
Barbiturates: NOT DETECTED
COCAINE: NOT DETECTED
OPIATES: NOT DETECTED
Tetrahydrocannabinol: NOT DETECTED

## 2017-04-26 LAB — APTT: aPTT: 28 seconds (ref 24–36)

## 2017-04-26 LAB — SEDIMENTATION RATE: SED RATE: 13 mm/h (ref 0–16)

## 2017-04-26 LAB — DIFFERENTIAL
BASOS PCT: 0 %
Basophils Absolute: 0 10*3/uL (ref 0.0–0.1)
EOS PCT: 0 %
Eosinophils Absolute: 0.1 10*3/uL (ref 0.0–0.7)
Lymphocytes Relative: 8 %
Lymphs Abs: 1.2 10*3/uL (ref 0.7–4.0)
MONO ABS: 1.7 10*3/uL — AB (ref 0.1–1.0)
Monocytes Relative: 12 %
NEUTROS ABS: 11.5 10*3/uL — AB (ref 1.7–7.7)
NEUTROS PCT: 80 %

## 2017-04-26 LAB — URINALYSIS, ROUTINE W REFLEX MICROSCOPIC
BACTERIA UA: NONE SEEN
Bilirubin Urine: NEGATIVE
KETONES UR: 5 mg/dL — AB
LEUKOCYTES UA: NEGATIVE
Nitrite: NEGATIVE
PROTEIN: NEGATIVE mg/dL
SQUAMOUS EPITHELIAL / LPF: NONE SEEN
Specific Gravity, Urine: 1.021 (ref 1.005–1.030)
pH: 5 (ref 5.0–8.0)

## 2017-04-26 LAB — PROTIME-INR
INR: 1.02
PROTHROMBIN TIME: 13.3 s (ref 11.4–15.2)

## 2017-04-26 LAB — CBG MONITORING, ED: GLUCOSE-CAPILLARY: 132 mg/dL — AB (ref 65–99)

## 2017-04-26 LAB — I-STAT TROPONIN, ED: Troponin i, poc: 0 ng/mL (ref 0.00–0.08)

## 2017-04-26 LAB — ETHANOL

## 2017-04-26 MED ORDER — ACETAMINOPHEN 325 MG PO TABS
650.0000 mg | ORAL_TABLET | Freq: Once | ORAL | Status: AC
  Administered 2017-04-26: 650 mg via ORAL
  Filled 2017-04-26: qty 2

## 2017-04-26 MED ORDER — DOXYCYCLINE HYCLATE 100 MG PO TABS
100.0000 mg | ORAL_TABLET | Freq: Once | ORAL | Status: AC
  Administered 2017-04-26: 100 mg via ORAL
  Filled 2017-04-26: qty 1

## 2017-04-26 MED ORDER — GADOBENATE DIMEGLUMINE 529 MG/ML IV SOLN
15.0000 mL | Freq: Once | INTRAVENOUS | Status: AC | PRN
  Administered 2017-04-26: 15 mL via INTRAVENOUS

## 2017-04-26 MED ORDER — MORPHINE SULFATE (PF) 2 MG/ML IV SOLN
2.0000 mg | INTRAVENOUS | Status: DC | PRN
  Administered 2017-04-26: 2 mg via INTRAVENOUS
  Filled 2017-04-26: qty 1

## 2017-04-26 MED ORDER — DOXYCYCLINE HYCLATE 100 MG PO TABS: 100.0000 mg | ORAL_TABLET | Freq: Two times a day (BID) | ORAL | 0 refills | Status: DC

## 2017-04-26 NOTE — ED Notes (Signed)
This NT ambulated pt in hallway, pt maintained a steady gait and o2 of 97.

## 2017-04-26 NOTE — ED Triage Notes (Signed)
Pt reports shooting pains in head x 2 days with memory loss and difficulty concentrating.  Pt initially unable to state the month and is unable to tell me what happened or the year.  No focal weakness noted.

## 2017-04-26 NOTE — ED Provider Notes (Signed)
[facility] DEPT Provider Note   CSN: [id number] Arrival date & time: 04/26/17  1031     History   Chief Complaint Chief Complaint  Patient presents with  . Headache    HPI [name] is a 71 y.o. male.  The history is provided by the patient and the spouse. The history is limited by the condition of the patient (confusion).  Headache    Pt was seen at 1105. Per pt and his wife:  Pt c/o "sharp" pains in his frontal and bi-temporal areas for the past 2 days. Pt has had multiple episodes of pain over the past 2 days, but pain lasts for only a few seconds each episode. Pt's wife states pt has been "getting more confused" over the past 2 days also. Denies injury, no visual changes, no focal motor weakness, no tingling/numbness in extremities, no ataxia, no slurred speech, no facial droop. Denies CP/palpitations, no cough/SOB, no abd pain, no N/V/D.     Past Medical History:  Diagnosis Date  . CAD S/P percutaneous coronary angioplasty 1998   PCI TO CX  . Diabetes mellitus without complication ([place])   . GERD (gastroesophageal reflux disease)   . 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 Hypercholesteremia   . Hypertension   . Neuropathy   . Peripheral vascular disease ([place])    s/p R-L FEM-FEM BYPASS  . Tachycardia    after CABG, pt on medicine for this    Patient Active Problem List   Diagnosis Date Noted  . PAD (peripheral artery disease) ([place]) 08/09/2016  . Claudication ([place]) 05/08/2016  . HCAP (healthcare-associated pneumonia) 01/12/2016  . Hyponatremia 01/12/2016  . Abnormal chest CT 10/13/2015  . Type 2 diabetes, HbA1c goal < 7% (HCC)   . Unstable angina ([place])   . Hypomagnesemia   . Healthcare-associated pneumonia 10/09/2015  . Type 2 diabetes mellitus ([place]) 10/09/2015  . ASCVD (arteriosclerotic cardiovascular disease) 10/09/2015  . CKD (chronic kidney disease) stage 2, GFR 60-89 ml/min 10/09/2015  . CAP (community acquired pneumonia) 07/09/2015  . Sepsis ([place])  07/09/2015  . 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 PVD (peripheral vascular disease) ([place]) 07/09/2015  . Essential hypertension 07/09/2015  . Hypotension 07/09/2015  . Neuropathy, peripheral 07/09/2015  . HLD (hyperlipidemia) 07/09/2015  . AKI (acute kidney injury) ([place]) 07/09/2015    Past Surgical History:  Procedure Laterality Date  . ABDOMINAL AORTOGRAM Bilateral 09/19/2016   Procedure: iliac;  Surgeon: Serafina Mitchell, MD;  Location: [facility];  Service: Cardiovascular;  Laterality: Bilateral;  . CARDIAC CATHETERIZATION  2003   with stent  . CARDIAC CATHETERIZATION N/A 10/11/2015   Procedure: Left Heart Cath and Coronary Angiography;  Surgeon: Peter M Martinique, MD;  Location: [facility];  Service: Cardiovascular;  Laterality: N/A;  . COLONOSCOPY N/A 09/22/2013   Procedure: COLONOSCOPY;  Surgeon: Daneil Dolin, MD;  [facility];  Service: Endoscopy;  Laterality: N/A;  9:30 AM  . CORONARY ARTERY BYPASS GRAFT N/A 10/18/2015   Procedure: CORONARY ARTERY BYPASS GRAFTING (CABG) x  five, using left internal mammary artery and right leg greater saphenous vein harvested endoscopically;  Surgeon: [name], MD;  Location: [facility];  Service: Open Heart Surgery;  Laterality: N/A;  . ENDARTERECTOMY FEMORAL Right 08/09/2016   Procedure: ENDARTERECTOMY FEMORAL WITH VEIN PATCH ANGIOPLASTY;  Surgeon: Serafina Mitchell, MD;  Location: [facility];  Service: Vascular;  Laterality: Right;  . FEMORAL-FEMORAL BYPASS  GRAFT Bilateral 08/09/2016   Procedure: REVISION BYPASS GRAFT RIGHT FEMORAL-LEFT FEMORAL ARTERY;  Surgeon: Serafina Mitchell, MD;  Location: [facility];  Service: Vascular;  Laterality: Bilateral;  . FEMORAL-POPLITEAL BYPASS GRAFT    . PERIPHERAL VASCULAR CATHETERIZATION N/A 05/08/2016   Procedure: Lower Extremity Angiography;  Surgeon: Lorretta Harp, MD;  Location: [facility];  Service: Cardiovascular;  Laterality: N/A;  . PERIPHERAL VASCULAR INTERVENTION Right  09/19/2016   Procedure: Peripheral Vascular Intervention;  Surgeon: Serafina Mitchell, MD;  Location: [facility];  Service: Cardiovascular;  Laterality: Right;  ext iliac stent  . TEE WITHOUT CARDIOVERSION N/A 10/18/2015   Procedure: TRANSESOPHAGEAL ECHOCARDIOGRAM (TEE);  Surgeon: [name], MD;  Location: [facility];  Service: Open Heart Surgery;  Laterality: N/A;       Home Medications    Prior to Admission medications   Medication Sig Start Date End Date Taking? Authorizing Provider  amLODipine (NORVASC) 10 MG tablet Take 10 mg by mouth daily.    [provider]  aspirin 81 MG tablet Take 81 mg by mouth daily.    [provider]  Cholecalciferol (VITAMIN D) 2000 UNITS tablet Take 2,000 Units by mouth daily.    [provider]  cilostazol (PLETAL) 100 MG tablet Take 100 mg by mouth 2 (two) times daily.    [provider]  ferrous sulfate 325 (65 FE) MG tablet Take 325-650 mg by mouth 2 (two) times daily with a meal. 658m in the morning and 327min the evening     [provider]  gabapentin (NEURONTIN) 300 MG capsule Take 300 mg by mouth 3 (three) times daily.     [provider]  insulin glargine (LANTUS) 100 UNIT/ML injection Give 10 units before bedtime each day. 01/20/17 01/20/18  LeOrvan FalconerMD  metoprolol tartrate (LOPRESSOR) 25 MG tablet Take 1 tablet (25 mg total) by mouth 2 (two) times daily. 01/16/17   MeAlmyra DeforestPA  pantoprazole (PROTONIX) 40 MG tablet Take 40 mg by mouth daily.    [provider]  simvastatin (ZOCOR) 40 MG tablet Take 20 mg by mouth at bedtime.     [provider]    Family History Family History  Problem Relation Age of Onset  . Heart attack Mother   . Stroke Mother   . Colon cancer Neg Hx     Social History Social History  Substance Use Topics  . Smoking status: Former Smoker    Packs/day: 2.00    Years: 30.00    Types: Cigarettes    Quit date: 08/14/2000  . Smokeless  tobacco: Never Used  . Alcohol use No     Allergies   No known allergies   Review of Systems Review of Systems  Unable to perform ROS: Mental status change  Neurological: Positive for headaches.      Physical Exam Updated Vital Signs BP (!) 142/91   Pulse 86   Temp 99.1 F (37.3 C) (Oral)   Resp 16   SpO2 97%   Physical Exam 1110: Physical examination:  Nursing notes reviewed; Vital signs and O2 SAT reviewed;  Constitutional: Well developed, Well nourished, Well hydrated, In no acute distress; Head:  Normocephalic, atraumatic; Eyes: EOMI, PERRL, No scleral icterus; ENMT: TM's clear bilat. +edemetous nasal turbinates bilat with clear rhinorrhea.  Mouth and pharynx normal, Mucous membranes moist; Neck: Supple, Full range of motion, No lymphadenopathy; Cardiovascular: Tachycardic rate and rhythm, No gallop; Respiratory: Breath sounds clear & equal  bilaterally, No wheezes.  Speaking full sentences with ease, Normal respiratory effort/excursion; Chest: Nontender, Movement normal; Abdomen: Soft, Nontender, Nondistended, Normal bowel sounds; Genitourinary: No CVA tenderness; Spine:  No midline CS, TS, LS tenderness.;;  Extremities: Pulses normal, No tenderness, No edema, No calf edema or asymmetry.; Neuro: Awake, alert, confused re: time, recent events. Major CN grossly intact. Speech clear.  No facial droop.  No nystagmus. Grips equal. Strength 5/5 equal bilat UE's and LE's.  DTR 2/4 equal bilat UE's and LE's.  No gross sensory deficits.  Normal cerebellar testing bilat UE's (finger-nose) and LE's (heel-shin)..; Skin: Color normal, Warm, Dry.   ED Treatments / Results  Labs (all labs ordered are listed, but only abnormal results are displayed)   EKG  EKG Interpretation  Date/Time:  Thursday April 26 2017 11:05:37 EDT Ventricular Rate:  100 PR Interval:    QRS Duration: 114 QT Interval:  333 QTC Calculation: 430 R Axis:   54 Text Interpretation:  Sinus tachycardia Prolonged  PR interval Borderline intraventricular conduction delay When compared with ECG of 01/19/2017 No significant change was found Confirmed by Francine Graven [phone]) on 04/26/2017 11:15:30 AM       Radiology   Procedures Procedures (including critical care time)  Medications Ordered in ED Medications - No data to display   Initial Impression / Assessment and Plan / ED Course  I have reviewed the triage vital signs and the nursing notes.  Pertinent labs & imaging results that were available during my care of the patient were reviewed by me and considered in my medical decision making (see chart for details).  MDM Reviewed: previous chart, nursing note and vitals Reviewed previous: labs and ECG Interpretation: labs, ECG, CT scan and MRI    Results for orders placed or performed during the hospital encounter of 04/26/17  Protime-INR  Result Value Ref Range   Prothrombin Time 13.3 11.4 - 15.2 seconds   INR 1.02   APTT  Result Value Ref Range   aPTT 28 24 - 36 seconds  CBC  Result Value Ref Range   WBC 14.5 (H) 4.0 - 10.5 K/uL   RBC 4.78 4.22 - 5.81 MIL/uL   Hemoglobin 13.7 13.0 - 17.0 g/dL   HCT 41.6 39.0 - 52.0 %   MCV 87.0 78.0 - 100.0 fL   MCH 28.7 26.0 - 34.0 pg   MCHC 32.9 30.0 - 36.0 g/dL   RDW 15.0 11.5 - 15.5 %   Platelets 318 150 - 400 K/uL  Differential  Result Value Ref Range   Neutrophils Relative % 80 %   Neutro Abs 11.5 (H) 1.7 - 7.7 K/uL   Lymphocytes Relative 8 %   Lymphs Abs 1.2 0.7 - 4.0 K/uL   Monocytes Relative 12 %   Monocytes Absolute 1.7 (H) 0.1 - 1.0 K/uL   Eosinophils Relative 0 %   Eosinophils Absolute 0.1 0.0 - 0.7 K/uL   Basophils Relative 0 %   Basophils Absolute 0.0 0.0 - 0.1 K/uL  Comprehensive metabolic panel  Result Value Ref Range   Sodium 133 (L) 135 - 145 mmol/L   Potassium 3.7 3.5 - 5.1 mmol/L   Chloride 98 (L) 101 - 111 mmol/L   CO2 24 22 - 32 mmol/L   Glucose, Bld 139 (H) 65 - 99 mg/dL   BUN 23 (H) 6 - 20 mg/dL    Creatinine, Ser 1.44 (H) 0.61 - 1.24 mg/dL   Calcium 8.8 (L) 8.9 - 10.3 mg/dL   Total Protein 6.8  6.5 - 8.1 g/dL   Albumin 3.6 3.5 - 5.0 g/dL   AST 13 (L) 15 - 41 U/L   ALT 11 (L) 17 - 63 U/L   Alkaline Phosphatase 68 38 - 126 U/L   Total Bilirubin 0.6 0.3 - 1.2 mg/dL   GFR calc non Af Amer 48 (L) >60 mL/min   GFR calc Af Amer 56 (L) >60 mL/min   Anion gap 11 5 - 15  Sedimentation rate  Result Value Ref Range   Sed Rate 13 0 - 16 mm/hr  Urine rapid drug screen (hosp performed)  Result Value Ref Range   Opiates NONE DETECTED NONE DETECTED   Cocaine NONE DETECTED NONE DETECTED   Benzodiazepines NONE DETECTED NONE DETECTED   Amphetamines NONE DETECTED NONE DETECTED   Tetrahydrocannabinol NONE DETECTED NONE DETECTED   Barbiturates NONE DETECTED NONE DETECTED  Ethanol  Result Value Ref Range   Alcohol, Ethyl (B) <5 <5 mg/dL  I-stat troponin, ED  Result Value Ref Range   Troponin i, poc 0.00 0.00 - 0.08 ng/mL   Comment 3          CBG monitoring, ED  Result Value Ref Range   Glucose-Capillary 132 (H) 65 - 99 mg/dL  I-Stat Chem 8, ED  Result Value Ref Range   Sodium 135 135 - 145 mmol/L   Potassium 3.8 3.5 - 5.1 mmol/L   Chloride 98 (L) 101 - 111 mmol/L   BUN 24 (H) 6 - 20 mg/dL   Creatinine, Ser 1.40 (H) 0.61 - 1.24 mg/dL   Glucose, Bld 137 (H) 65 - 99 mg/dL   Calcium, Ion 1.08 (L) 1.15 - 1.40 mmol/L   TCO2 23 22 - 32 mmol/L   Hemoglobin 14.6 13.0 - 17.0 g/dL   HCT 43.0 39.0 - 52.0 %   Ct Head Wo Contrast Result Date: 04/26/2017 CLINICAL DATA:  Shooting temporal pain and confusion for 2 days. EXAM: CT HEAD WITHOUT CONTRAST TECHNIQUE: Contiguous axial images were obtained from the base of the skull through the vertex without intravenous contrast. COMPARISON:  01/03/2010 FINDINGS: Brain: There is mild central and cortical atrophy. There is no intra or extra-axial fluid collection or mass lesion. The basilar cisterns and ventricles have a normal appearance. There is no CT evidence  for acute infarction or hemorrhage. Vascular: There is dense atherosclerotic calcification of the internal carotid arteries. Skull: Normal. Negative for fracture or focal lesion. Sinuses/Orbits: No acute finding. Other: None IMPRESSION: 1. Mild, stable atrophy. 2.  No evidence for acute intracranial abnormality. Electronically Signed   By: Nolon Nations M.D.   On: 04/26/2017 11:29   Mr Brain Wo Contrast (neuro Protocol) Result Date: 04/26/2017 CLINICAL DATA:  Headache and memory loss EXAM: MRI HEAD WITHOUT CONTRAST TECHNIQUE: Multiplanar, multiecho pulse sequences of the brain and surrounding structures were obtained without intravenous contrast. COMPARISON:  CT head 04/26/2017 FINDINGS: Brain: Moderate cerebral atrophy. Negative for hydrocephalus. Negative for acute or chronic infarction. Negative for hemorrhage, mass, or edema Vascular: Normal arterial flow voids Skull and upper cervical spine: Negative Sinuses/Orbits: Mild right mastoid sinus effusion. Paranasal sinuses clear. Negative orbit Other: None IMPRESSION: Moderate atrophy without acute abnormality. Electronically Signed   By: Franchot Gallo M.D.   On: 04/26/2017 12:30   Mr Jodene Nam Head (cerebral Arteries) Result Date: 04/26/2017 CLINICAL DATA:  Headache and memory loss. EXAM: MRA HEAD WITHOUT CONTRAST TECHNIQUE: Angiographic images of the Circle of Willis were obtained using MRA technique without intravenous contrast. COMPARISON:  MRI head 04/26/2017 FINDINGS:  Internal carotid artery patent bilaterally without stenosis. Anterior and middle cerebral arteries patent bilaterally. Artifact through the M2 branches. No significant stenosis. Right vertebral dominant and widely patent. Right PICA patent. Small left distal vertebral artery. Left vertebral artery not visualized at the skullbase and could be occluded or be hypoplastic. Basilar widely patent. Superior cerebellar and posterior cerebral arteries patent. Distal posterior cerebral artery is not  well visualized likely due to artifact. Negative for aneurysm. IMPRESSION: Left vertebral artery not visualized at the skullbase and may be hypoplastic or occluded. Otherwise no large vessel occlusion. Electronically Signed   By: Franchot Gallo M.D.   On: 04/26/2017 12:33    1345:  Initial CT scan and MRI/MRA as above. T/C to [facility] Neuro Dr. Cheral Marker, case discussed, including:  HPI, pertinent PM/SHx, VS/PE, dx testing, ED course and treatment: requests to obtain MRI post-contrast/T1 image with contrast and LP, also ask wife/family if pt has had confusion for months (vs days) as his brain images show more atrophy that usual for age and pt may have underlying dementia - which became more prominent with pain, etc.  9507:  Pt's wife and multiple family members are in room now. Dx and testing, as well as d/w Neuro MD, d/w pt and family.  Questions answered.  Verb understanding.  Pt and all family members absolutely refuse LP to be performed. Risks/benefits explained; pt and all family continue to refuse LP.  All family members also quickly stated that pt has been confused for several months, and pt's daughter and wife both asked "if maybe he had dementia."  Pt's family also now mentions that pt "may have had a cough." Will obtain extra MRI images and CXR.  1430:  MRI reassuring. CXR with pneumonia. BUN/Cr per baseline. ESR normal.  CBG elevated, but pt not acidotic. Pt has ambulated with steady gait, easy resps, Sats 97% R/A and denied CP/SOB. Neuro exam remains intact and unchanged.  T/C to [facility] Neuro Dr. Cheral Marker, case discussed, including:  HPI, pertinent PM/SHx, VS/PE, dx testing, ED course and treatment: updated information regarding testing, refusal of LP, and d/w family, given MRI/MRA all reassuring, there is no indication for further neuro testing at this time for headache.   1505:  Pt has tol PO well while in the ED without N/V. Family and pt all want to go home (vs admit).  CURB and PORT scores low risk.   Will tx for pneumonia with doxycycline, APAP for headache. Dx and testing d/w pt and family.  Questions answered.  Verb understanding, agreeable to d/c home with outpt f/u.      Final Clinical Impressions(s) / ED Diagnoses   Final diagnoses:  None    New Prescriptions New Prescriptions   No medications on file      Francine Graven, DO 04/30/17 1502

## 2017-04-26 NOTE — ED Notes (Signed)
Pt transported to MRA. 

## 2017-04-26 NOTE — Discharge Instructions (Signed)
Take the prescription as directed.  Take over the counter tylenol, as directed on packaging, as needed for discomfort. Call your regular medical doctor today to schedule a follow up appointment within the next 3 days.  Return to the Emergency Department immediately sooner if worsening.

## 2017-04-26 NOTE — ED Notes (Signed)
Back from CT. Wife at bedside

## 2017-05-07 ENCOUNTER — Encounter (HOSPITAL_COMMUNITY): Payer: Medicare Other

## 2017-05-07 ENCOUNTER — Ambulatory Visit: Payer: Medicare Other | Admitting: Surgery

## 2017-05-31 ENCOUNTER — Emergency Department (HOSPITAL_COMMUNITY): Payer: Medicare Other

## 2017-05-31 ENCOUNTER — Inpatient Hospital Stay (HOSPITAL_COMMUNITY)
Admission: EM | Admit: 2017-05-31 | Discharge: 2017-06-02 | DRG: 872 | Disposition: A | Discharge status: Home / Self Care | Payer: Medicare Other | Attending: Family Medicine | Admitting: Family Medicine

## 2017-05-31 ENCOUNTER — Inpatient Hospital Stay (HOSPITAL_COMMUNITY): Payer: Medicare Other

## 2017-05-31 ENCOUNTER — Encounter (HOSPITAL_COMMUNITY): Payer: Self-pay | Admitting: Emergency Medicine

## 2017-05-31 DIAGNOSIS — R6510 Systemic inflammatory response syndrome (SIRS) of non-infectious origin without acute organ dysfunction: Secondary | ICD-10-CM | POA: Diagnosis present

## 2017-05-31 DIAGNOSIS — Z8249 Family history of ischemic heart disease and other diseases of the circulatory system: Secondary | ICD-10-CM

## 2017-05-31 DIAGNOSIS — Z87891 Personal history of nicotine dependence: Secondary | ICD-10-CM

## 2017-05-31 DIAGNOSIS — E1142 Type 2 diabetes mellitus with diabetic polyneuropathy: Secondary | ICD-10-CM | POA: Diagnosis present

## 2017-05-31 DIAGNOSIS — R404 Transient alteration of awareness: Secondary | ICD-10-CM | POA: Diagnosis not present

## 2017-05-31 DIAGNOSIS — Z794 Long term (current) use of insulin: Secondary | ICD-10-CM | POA: Diagnosis not present

## 2017-05-31 DIAGNOSIS — Z951 Presence of aortocoronary bypass graft: Secondary | ICD-10-CM | POA: Diagnosis not present

## 2017-05-31 DIAGNOSIS — Z23 Encounter for immunization: Secondary | ICD-10-CM | POA: Diagnosis not present

## 2017-05-31 DIAGNOSIS — E1151 Type 2 diabetes mellitus with diabetic peripheral angiopathy without gangrene: Secondary | ICD-10-CM | POA: Diagnosis present

## 2017-05-31 DIAGNOSIS — N182 Chronic kidney disease, stage 2 (mild): Secondary | ICD-10-CM | POA: Diagnosis not present

## 2017-05-31 DIAGNOSIS — E1122 Type 2 diabetes mellitus with diabetic chronic kidney disease: Secondary | ICD-10-CM | POA: Diagnosis not present

## 2017-05-31 DIAGNOSIS — E785 Hyperlipidemia, unspecified: Secondary | ICD-10-CM | POA: Diagnosis present

## 2017-05-31 DIAGNOSIS — I951 Orthostatic hypotension: Secondary | ICD-10-CM | POA: Diagnosis present

## 2017-05-31 DIAGNOSIS — I129 Hypertensive chronic kidney disease with stage 1 through stage 4 chronic kidney disease, or unspecified chronic kidney disease: Secondary | ICD-10-CM | POA: Diagnosis not present

## 2017-05-31 DIAGNOSIS — Z9861 Coronary angioplasty status: Secondary | ICD-10-CM | POA: Diagnosis not present

## 2017-05-31 DIAGNOSIS — Z7982 Long term (current) use of aspirin: Secondary | ICD-10-CM | POA: Diagnosis not present

## 2017-05-31 DIAGNOSIS — I2510 Atherosclerotic heart disease of native coronary artery without angina pectoris: Secondary | ICD-10-CM | POA: Diagnosis present

## 2017-05-31 DIAGNOSIS — I959 Hypotension, unspecified: Secondary | ICD-10-CM | POA: Diagnosis not present

## 2017-05-31 DIAGNOSIS — E7800 Pure hypercholesterolemia, unspecified: Secondary | ICD-10-CM | POA: Diagnosis not present

## 2017-05-31 DIAGNOSIS — Z823 Family history of stroke: Secondary | ICD-10-CM

## 2017-05-31 DIAGNOSIS — N181 Chronic kidney disease, stage 1: Secondary | ICD-10-CM | POA: Diagnosis not present

## 2017-05-31 DIAGNOSIS — A419 Sepsis, unspecified organism: Secondary | ICD-10-CM | POA: Diagnosis not present

## 2017-05-31 DIAGNOSIS — E119 Type 2 diabetes mellitus without complications: Secondary | ICD-10-CM

## 2017-05-31 DIAGNOSIS — R509 Fever, unspecified: Secondary | ICD-10-CM | POA: Diagnosis not present

## 2017-05-31 DIAGNOSIS — R531 Weakness: Secondary | ICD-10-CM | POA: Diagnosis not present

## 2017-05-31 DIAGNOSIS — R05 Cough: Secondary | ICD-10-CM | POA: Diagnosis not present

## 2017-05-31 DIAGNOSIS — R55 Syncope and collapse: Secondary | ICD-10-CM | POA: Diagnosis not present

## 2017-05-31 DIAGNOSIS — K219 Gastro-esophageal reflux disease without esophagitis: Secondary | ICD-10-CM | POA: Diagnosis present

## 2017-05-31 LAB — CBC WITH DIFFERENTIAL/PLATELET
BASOS ABS: 0 10*3/uL (ref 0.0–0.1)
BASOS PCT: 0 %
Eosinophils Absolute: 0.1 10*3/uL (ref 0.0–0.7)
Eosinophils Relative: 1 %
HEMATOCRIT: 42.8 % (ref 39.0–52.0)
HEMOGLOBIN: 13.8 g/dL (ref 13.0–17.0)
LYMPHS PCT: 3 %
Lymphs Abs: 0.2 10*3/uL — ABNORMAL LOW (ref 0.7–4.0)
MCH: 28.1 pg (ref 26.0–34.0)
MCHC: 32.2 g/dL (ref 30.0–36.0)
MCV: 87.2 fL (ref 78.0–100.0)
Monocytes Absolute: 0 10*3/uL — ABNORMAL LOW (ref 0.1–1.0)
Monocytes Relative: 0 %
NEUTROS ABS: 6.6 10*3/uL (ref 1.7–7.7)
NEUTROS PCT: 96 %
Platelets: 315 10*3/uL (ref 150–400)
RBC: 4.91 MIL/uL (ref 4.22–5.81)
RDW: 15.1 % (ref 11.5–15.5)
WBC: 6.9 10*3/uL (ref 4.0–10.5)

## 2017-05-31 LAB — COMPREHENSIVE METABOLIC PANEL
ALT: 11 U/L — AB (ref 17–63)
AST: 15 U/L (ref 15–41)
Albumin: 3.6 g/dL (ref 3.5–5.0)
Alkaline Phosphatase: 74 U/L (ref 38–126)
Anion gap: 11 (ref 5–15)
BUN: 22 mg/dL — ABNORMAL HIGH (ref 6–20)
CHLORIDE: 102 mmol/L (ref 101–111)
CO2: 22 mmol/L (ref 22–32)
CREATININE: 1.54 mg/dL — AB (ref 0.61–1.24)
Calcium: 9 mg/dL (ref 8.9–10.3)
GFR calc non Af Amer: 44 mL/min — ABNORMAL LOW (ref >60)
GFR, EST AFRICAN AMERICAN: 51 mL/min — AB (ref >60)
Glucose, Bld: 151 mg/dL — ABNORMAL HIGH (ref 65–99)
POTASSIUM: 3.8 mmol/L (ref 3.5–5.1)
SODIUM: 135 mmol/L (ref 135–145)
Total Bilirubin: 0.6 mg/dL (ref 0.3–1.2)
Total Protein: 6.9 g/dL (ref 6.5–8.1)

## 2017-05-31 LAB — URINALYSIS, ROUTINE W REFLEX MICROSCOPIC
BILIRUBIN URINE: NEGATIVE
Bacteria, UA: NONE SEEN
Glucose, UA: >=500 mg/dL — AB
Hgb urine dipstick: NEGATIVE
Ketones, ur: NEGATIVE mg/dL
Leukocytes, UA: NEGATIVE
Nitrite: NEGATIVE
Protein, ur: NEGATIVE mg/dL
SPECIFIC GRAVITY, URINE: 1.016 (ref 1.005–1.030)
pH: 5 (ref 5.0–8.0)

## 2017-05-31 LAB — I-STAT CHEM 8, ED
BUN: 23 mg/dL — ABNORMAL HIGH (ref 6–20)
CREATININE: 1.5 mg/dL — AB (ref 0.61–1.24)
Calcium, Ion: 1.17 mmol/L (ref 1.15–1.40)
Chloride: 102 mmol/L (ref 101–111)
Glucose, Bld: 137 mg/dL — ABNORMAL HIGH (ref 65–99)
HCT: 44 % (ref 39.0–52.0)
Hemoglobin: 15 g/dL (ref 13.0–17.0)
Potassium: 4 mmol/L (ref 3.5–5.1)
Sodium: 136 mmol/L (ref 135–145)
TCO2: 22 mmol/L (ref 22–32)

## 2017-05-31 LAB — INFLUENZA PANEL BY PCR (TYPE A & B)
INFLAPCR: NEGATIVE
INFLBPCR: NEGATIVE

## 2017-05-31 LAB — I-STAT CG4 LACTIC ACID, ED
LACTIC ACID, VENOUS: 2.04 mmol/L — AB (ref 0.5–1.9)
LACTIC ACID, VENOUS: 0.92 mmol/L (ref 0.5–1.9)

## 2017-05-31 LAB — I-STAT TROPONIN, ED: Troponin i, poc: 0.02 ng/mL (ref 0.00–0.08)

## 2017-05-31 LAB — TROPONIN I

## 2017-05-31 MED ORDER — ACETAMINOPHEN 650 MG RE SUPP: 650.0000 mg | Freq: Four times a day (QID) | RECTAL | Status: DC | PRN

## 2017-05-31 MED ORDER — SODIUM CHLORIDE 0.9 % IV BOLUS (SEPSIS)
250.0000 mL | Freq: Once | INTRAVENOUS | Status: AC
  Administered 2017-05-31: 250 mL via INTRAVENOUS

## 2017-05-31 MED ORDER — CILOSTAZOL 100 MG PO TABS
100.0000 mg | ORAL_TABLET | Freq: Two times a day (BID) | ORAL | Status: DC
  Administered 2017-06-01 – 2017-06-02 (×3): 100 mg via ORAL
  Filled 2017-05-31 (×13): qty 1

## 2017-05-31 MED ORDER — SODIUM CHLORIDE 0.9 % IV SOLN
1000.0000 mL | INTRAVENOUS | Status: DC
  Administered 2017-05-31 – 2017-06-01 (×2): 1000 mL via INTRAVENOUS

## 2017-05-31 MED ORDER — ONDANSETRON HCL 4 MG/2ML IJ SOLN: 4.0000 mg | Freq: Four times a day (QID) | INTRAMUSCULAR | Status: DC | PRN

## 2017-05-31 MED ORDER — METOPROLOL TARTRATE 25 MG PO TABS
25.0000 mg | ORAL_TABLET | Freq: Two times a day (BID) | ORAL | Status: DC
  Administered 2017-06-01 – 2017-06-02 (×3): 25 mg via ORAL
  Filled 2017-05-31 (×3): qty 1

## 2017-05-31 MED ORDER — GABAPENTIN 300 MG PO CAPS
300.0000 mg | ORAL_CAPSULE | Freq: Three times a day (TID) | ORAL | Status: DC
  Administered 2017-06-01 – 2017-06-02 (×5): 300 mg via ORAL
  Filled 2017-05-31 (×5): qty 1

## 2017-05-31 MED ORDER — VANCOMYCIN HCL IN DEXTROSE 1-5 GM/200ML-% IV SOLN
1000.0000 mg | Freq: Once | INTRAVENOUS | Status: AC
  Administered 2017-05-31: 1000 mg via INTRAVENOUS
  Filled 2017-05-31: qty 200

## 2017-05-31 MED ORDER — SODIUM CHLORIDE 0.9 % IV BOLUS (SEPSIS)
1000.0000 mL | Freq: Once | INTRAVENOUS | Status: AC
  Administered 2017-05-31: 1000 mL via INTRAVENOUS

## 2017-05-31 MED ORDER — PANTOPRAZOLE SODIUM 40 MG PO TBEC
40.0000 mg | DELAYED_RELEASE_TABLET | Freq: Every day | ORAL | Status: DC
  Administered 2017-06-01 – 2017-06-02 (×2): 40 mg via ORAL
  Filled 2017-05-31 (×2): qty 1

## 2017-05-31 MED ORDER — ENOXAPARIN SODIUM 40 MG/0.4ML ~~LOC~~ SOLN
40.0000 mg | SUBCUTANEOUS | Status: DC
  Administered 2017-06-01 (×2): 40 mg via SUBCUTANEOUS
  Filled 2017-05-31 (×2): qty 0.4

## 2017-05-31 MED ORDER — INSULIN ASPART 100 UNIT/ML ~~LOC~~ SOLN
0.0000 [IU] | Freq: Three times a day (TID) | SUBCUTANEOUS | Status: DC
  Administered 2017-06-01: 2 [IU] via SUBCUTANEOUS
  Administered 2017-06-02: 3 [IU] via SUBCUTANEOUS

## 2017-05-31 MED ORDER — PIPERACILLIN-TAZOBACTAM 3.375 G IVPB 30 MIN
3.3750 g | Freq: Once | INTRAVENOUS | Status: AC
  Administered 2017-05-31: 3.375 g via INTRAVENOUS
  Filled 2017-05-31: qty 50

## 2017-05-31 MED ORDER — ASPIRIN 81 MG PO CHEW
81.0000 mg | CHEWABLE_TABLET | Freq: Every day | ORAL | Status: DC
  Administered 2017-06-01 – 2017-06-02 (×2): 81 mg via ORAL
  Filled 2017-05-31 (×2): qty 1

## 2017-05-31 MED ORDER — ACETAMINOPHEN 325 MG PO TABS: 650.0000 mg | ORAL_TABLET | Freq: Four times a day (QID) | ORAL | Status: DC | PRN

## 2017-05-31 MED ORDER — PIPERACILLIN-TAZOBACTAM 3.375 G IVPB
3.3750 g | Freq: Three times a day (TID) | INTRAVENOUS | Status: DC
  Administered 2017-06-01 – 2017-06-02 (×3): 3.375 g via INTRAVENOUS
  Filled 2017-05-31 (×4): qty 50

## 2017-05-31 MED ORDER — SIMVASTATIN 20 MG PO TABS
20.0000 mg | ORAL_TABLET | Freq: Every day | ORAL | Status: DC
  Administered 2017-06-01 (×2): 20 mg via ORAL
  Filled 2017-05-31: qty 2
  Filled 2017-05-31: qty 1

## 2017-05-31 MED ORDER — INSULIN GLARGINE 100 UNIT/ML ~~LOC~~ SOLN
20.0000 [IU] | Freq: Every day | SUBCUTANEOUS | Status: DC
  Administered 2017-06-01 (×2): 20 [IU] via SUBCUTANEOUS
  Filled 2017-05-31 (×4): qty 0.2

## 2017-05-31 MED ORDER — ONDANSETRON HCL 4 MG PO TABS: 4.0000 mg | ORAL_TABLET | Freq: Four times a day (QID) | ORAL | Status: DC | PRN

## 2017-05-31 MED ORDER — ACETAMINOPHEN 325 MG PO TABS
650.0000 mg | ORAL_TABLET | Freq: Once | ORAL | Status: AC
  Administered 2017-05-31: 650 mg via ORAL
  Filled 2017-05-31: qty 2

## 2017-05-31 NOTE — ED Notes (Signed)
Patient transported to X-ray 

## 2017-05-31 NOTE — ED Provider Notes (Signed)
[facility] EMERGENCY DEPARTMENT Provider Note   CSN: [id number] Arrival date & time: 05/31/17  1529     History   Chief Complaint Chief Complaint  Patient presents with  . Fever    HPI [name] is a 71 y.o. male.  HPI  Pt was seen at 1535. Per pt and his family, c/o sudden onset and persistence of constant "shaking chills" that began this afternoon PTA. Has been associated with generalized weakness/fatigue and "looking pale." Pt states he has had a cough "for a few days." Pt states he came home after having 2 teeth filled before his symptoms began. Pt's family states while they were driving him to the hospital for evaluation, pt had an episode of "not responding" while in the vehicle; so EMS was called. Denies abd pain, no N/V/D, no CP/SOB, no rash, no sore throat, no intra-oral edema.   Past Medical History:  Diagnosis Date  . CAD S/P percutaneous coronary angioplasty 1998   PCI TO CX  . Diabetes mellitus without complication ([place])   . GERD (gastroesophageal reflux disease)   . Headache   . 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 Hypercholesteremia   . Hypertension   . Neuropathy   . Peripheral vascular disease ([place])    s/p R-L FEM-FEM BYPASS  . Tachycardia    after CABG, pt on medicine for this    Patient Active Problem List   Diagnosis Date Noted  . PAD (peripheral artery disease) ([place]) 08/09/2016  . Claudication ([place]) 05/08/2016  . HCAP (healthcare-associated pneumonia) 01/12/2016  . Hyponatremia 01/12/2016  . Abnormal chest CT 10/13/2015  . Type 2 diabetes, HbA1c goal < 7% (HCC)   . Unstable angina ([place])   . Hypomagnesemia   . Healthcare-associated pneumonia 10/09/2015  . Type 2 diabetes mellitus ([place]) 10/09/2015  . ASCVD (arteriosclerotic cardiovascular disease) 10/09/2015  . CKD (chronic kidney disease) stage 2, GFR 60-89 ml/min 10/09/2015  . CAP (community acquired pneumonia) 07/09/2015  . Sepsis ([place]) 07/09/2015  . 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 PVD (peripheral vascular disease) ([place]) 07/09/2015  . Essential hypertension 07/09/2015  . Hypotension 07/09/2015  . Neuropathy, peripheral 07/09/2015  . HLD (hyperlipidemia) 07/09/2015  . AKI (acute kidney injury) ([place]) 07/09/2015    Past Surgical History:  Procedure Laterality Date  . ABDOMINAL AORTOGRAM Bilateral 09/19/2016   Procedure: iliac;  Surgeon: Serafina Mitchell, MD;  Location: [place] [facility];  Service: Cardiovascular;  Laterality: Bilateral;  . CARDIAC CATHETERIZATION  2003   with stent  . CARDIAC CATHETERIZATION N/A 10/11/2015   Procedure: Left Heart Cath and Coronary Angiography;  Surgeon: Peter M Martinique, MD;  Location: [place] [facility];  Service: Cardiovascular;  Laterality: N/A;  . COLONOSCOPY N/A 09/22/2013   Procedure: COLONOSCOPY;  Surgeon: Daneil Dolin, MD;  [facility];  Service: Endoscopy;  Laterality: N/A;  9:30 AM  . CORONARY ARTERY BYPASS GRAFT N/A 10/18/2015   Procedure: CORONARY ARTERY BYPASS GRAFTING (CABG) x  five, using left internal mammary artery and right leg greater saphenous vein harvested endoscopically;  Surgeon: [name], MD;  Location: [place];  Service: Open Heart Surgery;  Laterality: N/A;  . ENDARTERECTOMY FEMORAL Right 08/09/2016   Procedure: ENDARTERECTOMY FEMORAL WITH VEIN PATCH ANGIOPLASTY;  Surgeon: Serafina Mitchell, MD;  Location: [place];  Service: Vascular;  Laterality: Right;  . FEMORAL-FEMORAL BYPASS GRAFT Bilateral 08/09/2016   Procedure: REVISION BYPASS GRAFT RIGHT FEMORAL-LEFT FEMORAL  ARTERY;  Surgeon: Serafina Mitchell, MD;  Location: [place];  Service: Vascular;  Laterality: Bilateral;  . FEMORAL-POPLITEAL BYPASS GRAFT    . PERIPHERAL VASCULAR CATHETERIZATION N/A 05/08/2016   Procedure: Lower Extremity Angiography;  Surgeon: Lorretta Harp, MD;  Location: [place] [facility];  Service: Cardiovascular;  Laterality: N/A;  . PERIPHERAL VASCULAR INTERVENTION Right 09/19/2016   Procedure: Peripheral Vascular  Intervention;  Surgeon: Serafina Mitchell, MD;  Location: [place] [facility];  Service: Cardiovascular;  Laterality: Right;  ext iliac stent  . TEE WITHOUT CARDIOVERSION N/A 10/18/2015   Procedure: TRANSESOPHAGEAL ECHOCARDIOGRAM (TEE);  Surgeon: [name], MD;  Location: [place];  Service: Open Heart Surgery;  Laterality: N/A;       Home Medications    Prior to Admission medications   Medication Sig Start Date End Date Taking? Authorizing Provider  amLODipine (NORVASC) 10 MG tablet Take 10 mg by mouth daily.   Yes [provider]  aspirin 81 MG tablet Take 81 mg by mouth daily.   Yes [provider]  Cholecalciferol (VITAMIN D) 2000 UNITS tablet Take 2,000 Units by mouth daily.   Yes [provider]  cilostazol (PLETAL) 100 MG tablet Take 100 mg by mouth 2 (two) times daily.   Yes [provider]  ferrous sulfate 325 (65 FE) MG tablet Take 325-650 mg by mouth 2 (two) times daily with a meal. 650mg  in the morning and 325mg  in the evening    Yes [provider]  gabapentin (NEURONTIN) 300 MG capsule Take 300 mg by mouth 3 (three) times daily.    Yes [provider]  insulin glargine (LANTUS) 100 UNIT/ML injection Give 10 units before bedtime each day. Patient taking differently: 20 Units at bedtime. Give 10 units before bedtime each day. 01/20/17 01/20/18 Yes Orvan Falconer, MD  metoprolol tartrate (LOPRESSOR) 25 MG tablet Take 1 tablet (25 mg total) by mouth 2 (two) times daily. 01/16/17  Yes Almyra Deforest, PA  pantoprazole (PROTONIX) 40 MG tablet Take 40 mg by mouth daily.   Yes [provider]  simvastatin (ZOCOR) 40 MG tablet Take 20 mg by mouth at bedtime.    Yes [provider]  doxycycline (VIBRA-TABS) 100 MG tablet Take 1 tablet (100 mg total) by mouth 2 (two) times daily. Patient not taking: Reported on 05/31/2017 04/26/17   Francine Graven, DO    Family History Family History  Problem Relation Age of Onset  . Heart  attack Mother   . Stroke Mother   . Colon cancer Neg Hx     Social History Social History  Substance Use Topics  . Smoking status: Former Smoker    Packs/day: 2.00    Years: 30.00    Types: Cigarettes    Quit date: 08/14/2000  . Smokeless tobacco: Never Used  . Alcohol use No     Allergies   No known allergies   Review of Systems Review of Systems ROS: Statement: All systems negative except as marked or noted in the HPI; Constitutional: +fever and chills. ; ; Eyes: Negative for eye pain, redness and discharge. ; ; ENMT: Negative for ear pain, hoarseness, nasal congestion, sinus pressure and sore throat. ; ; Cardiovascular: Negative for chest pain, palpitations, diaphoresis, dyspnea and peripheral edema. ; ; Respiratory: +cough. Negative for wheezing and stridor. ; ; Gastrointestinal: Negative for nausea, vomiting, diarrhea, abdominal pain, blood in stool, hematemesis, jaundice and rectal bleeding. . ; ; Genitourinary: Negative for dysuria, flank pain and hematuria. ; ;  Musculoskeletal: Negative for back pain and neck pain. Negative for swelling and trauma.; ; Skin: Negative for pruritus, rash, abrasions, blisters, bruising and skin lesion.; ; Neuro: +syncope. Negative for headache, lightheadedness and neck stiffness. Negative for extremity weakness, paresthesias, involuntary movement, seizure.       Physical Exam Updated Vital Signs BP (!) 138/54   Pulse (!) 111   Temp (!) 103.5 F (39.7 C) (Rectal)   Resp (!) 25   Ht 5\' 6"  (1.676 m)   Wt 72.6 kg (160 lb)   SpO2 93%   BMI 25.82 kg/m    Patient Vitals for the past 24 hrs:  BP Temp Temp src Pulse Resp SpO2 Height Weight  05/31/17 1853 - 98.3 F (36.8 C) Oral - - - - -  05/31/17 1842 114/65 - - (!) 107 18 - - -  05/31/17 1830 124/62 - - (!) 106 18 - - -  05/31/17 1815 - - - (!) 103 19 93 % - -  05/31/17 1800 (!) 110/59 - - (!) 103 14 94 % - -  05/31/17 1630 (!) 138/54 - - (!) 111 (!) 25 93 % - -  05/31/17 1615 - (!)  103.5 F (39.7 C) Rectal - - - - -  05/31/17 1615 - - - (!) 105 15 95 % - -  05/31/17 1600 125/61 - - (!) 105 11 95 % - -  05/31/17 1531 (!) 91/59 100 F (37.8 C) Oral (!) 102 18 93 % 5\' 6"  (1.676 m) 72.6 kg (160 lb)     Physical Exam 1540: Physical examination:  Nursing notes reviewed; Vital signs and O2 SAT reviewed; +febrile.;; Constitutional: Well developed, Well nourished, In no acute distress; Head:  Normocephalic, atraumatic; Eyes: EOMI, PERRL, No scleral icterus; ENMT: Mouth and pharynx normal, Mucous membranes dry. Mouth and pharynx without lesions. No tonsillar exudates. No intra-oral edema. No submandibular or sublingual edema. No hoarse voice, no drooling, no stridor. No trismus. ; Neck: Supple, Full range of motion, No lymphadenopathy. No meningeal signs.; Cardiovascular: Tachycardic rate and rhythm, No gallop; Respiratory: Breath sounds clear & equal bilaterally, No wheezes.  Speaking full sentences with ease, Normal respiratory effort/excursion; Chest: Nontender, Movement normal; Abdomen: Soft, Nontender, Nondistended, Normal bowel sounds; Genitourinary: No CVA tenderness; Extremities: Pulses normal, No tenderness, No edema, No calf edema or asymmetry.; Neuro: AA&Ox3, Major CN grossly intact. No facial droop. Speech clear. No gross focal motor or sensory deficits in extremities.; Skin: Color normal, Warm, Dry.   ED Treatments / Results  Labs (all labs ordered are listed, but only abnormal results are displayed)   EKG  EKG Interpretation  Date/Time:  Thursday May 31 2017 15:40:23 EDT Ventricular Rate:  105 PR Interval:    QRS Duration: 119 QT Interval:  333 QTC Calculation: 441 R Axis:   24 Text Interpretation:  Sinus tachycardia PR interval prolonged Nonspecific intraventricular conduction delay When compared with ECG of 04/26/2017 No significant change was found Confirmed by Francine Graven [id number]) on 05/31/2017 4:45:38 PM        Radiology   Procedures Procedures (including critical care time)  Medications Ordered in ED Medications  0.9 %  sodium chloride infusion (0 mLs Intravenous Paused 05/31/17 1620)  piperacillin-tazobactam (ZOSYN) IVPB 3.375 g (3.375 g Intravenous New Bag/Given 05/31/17 1642)  vancomycin (VANCOCIN) IVPB 1000 mg/200 mL premix (not administered)  sodium chloride 0.9 % bolus 1,000 mL (1,000 mLs Intravenous New Bag/Given 05/31/17 1652)    And  sodium chloride 0.9 % bolus 1,000 mL (  1,000 mLs Intravenous New Bag/Given 05/31/17 1642)    And  sodium chloride 0.9 % bolus 250 mL (not administered)  acetaminophen (TYLENOL) tablet 650 mg (650 mg Oral Given 05/31/17 1641)     Initial Impression / Assessment and Plan / ED Course  I have reviewed the triage vital signs and the nursing notes.  Pertinent labs & imaging results that were available during my care of the patient were reviewed by me and considered in my medical decision making (see chart for details).  MDM Reviewed: previous chart, nursing note and vitals Reviewed previous: labs and ECG Interpretation: labs, ECG, CT scan and x-ray Total time providing critical care: 30-74 minutes. This excludes time spent performing separately reportable procedures and services. Consults: admitting MD   CRITICAL CARE Performed by: Alfonzo Feller Total critical care time: 35 minutes Critical care time was exclusive of separately billable procedures and treating other patients. Critical care was necessary to treat or prevent imminent or life-threatening deterioration. Critical care was time spent personally by me on the following activities: development of treatment plan with patient and/or surrogate as well as nursing, discussions with consultants, evaluation of patient's response to treatment, examination of patient, obtaining history from patient or surrogate, ordering and performing treatments and interventions, ordering and review of  laboratory studies, ordering and review of radiographic studies, pulse oximetry and re-evaluation of patient's condition.  Results for orders placed or performed during the hospital encounter of 05/31/17  Blood Culture (routine x 2)  Result Value Ref Range   Specimen Description LEFT ANTECUBITAL    Special Requests      BOTTLES DRAWN AEROBIC AND ANAEROBIC Blood Culture adequate volume   Culture PENDING    Report Status PENDING   Blood Culture (routine x 2)  Result Value Ref Range   Specimen Description RIGHT ANTECUBITAL    Special Requests      BOTTLES DRAWN AEROBIC AND ANAEROBIC Blood Culture adequate volume   Culture PENDING    Report Status PENDING   Comprehensive metabolic panel  Result Value Ref Range   Sodium 135 135 - 145 mmol/L   Potassium 3.8 3.5 - 5.1 mmol/L   Chloride 102 101 - 111 mmol/L   CO2 22 22 - 32 mmol/L   Glucose, Bld 151 (H) 65 - 99 mg/dL   BUN 22 (H) 6 - 20 mg/dL   Creatinine, Ser 1.54 (H) 0.61 - 1.24 mg/dL   Calcium 9.0 8.9 - 10.3 mg/dL   Total Protein 6.9 6.5 - 8.1 g/dL   Albumin 3.6 3.5 - 5.0 g/dL   AST 15 15 - 41 U/L   ALT 11 (L) 17 - 63 U/L   Alkaline Phosphatase 74 38 - 126 U/L   Total Bilirubin 0.6 0.3 - 1.2 mg/dL   GFR calc non Af Amer 44 (L) >60 mL/min   GFR calc Af Amer 51 (L) >60 mL/min   Anion gap 11 5 - 15  CBC WITH DIFFERENTIAL  Result Value Ref Range   WBC 6.9 4.0 - 10.5 K/uL   RBC 4.91 4.22 - 5.81 MIL/uL   Hemoglobin 13.8 13.0 - 17.0 g/dL   HCT 42.8 39.0 - 52.0 %   MCV 87.2 78.0 - 100.0 fL   MCH 28.1 26.0 - 34.0 pg   MCHC 32.2 30.0 - 36.0 g/dL   RDW 15.1 11.5 - 15.5 %   Platelets 315 150 - 400 K/uL   Neutrophils Relative % 96 %   Neutro Abs 6.6 1.7 - 7.7 K/uL  Lymphocytes Relative 3 %   Lymphs Abs 0.2 (L) 0.7 - 4.0 K/uL   Monocytes Relative 0 %   Monocytes Absolute 0.0 (L) 0.1 - 1.0 K/uL   Eosinophils Relative 1 %   Eosinophils Absolute 0.1 0.0 - 0.7 K/uL   Basophils Relative 0 %   Basophils Absolute 0.0 0.0 - 0.1 K/uL   Urinalysis, Routine w reflex microscopic  Result Value Ref Range   Color, Urine YELLOW YELLOW   APPearance CLEAR CLEAR   Specific Gravity, Urine 1.016 1.005 - 1.030   pH 5.0 5.0 - 8.0   Glucose, UA >=500 (A) NEGATIVE mg/dL   Hgb urine dipstick NEGATIVE NEGATIVE   Bilirubin Urine NEGATIVE NEGATIVE   Ketones, ur NEGATIVE NEGATIVE mg/dL   Protein, ur NEGATIVE NEGATIVE mg/dL   Nitrite NEGATIVE NEGATIVE   Leukocytes, UA NEGATIVE NEGATIVE   RBC / HPF 0-5 0 - 5 RBC/hpf   WBC, UA 0-5 0 - 5 WBC/hpf   Bacteria, UA NONE SEEN NONE SEEN   Squamous Epithelial / LPF 0-5 (A) NONE SEEN  Troponin I  Result Value Ref Range   Troponin I <0.03 <0.03 ng/mL  I-Stat CG4 Lactic Acid, ED  (not at  [facility])  Result Value Ref Range   Lactic Acid, Venous 2.04 (HH) 0.5 - 1.9 mmol/L  I-Stat CG4 Lactic Acid, ED  (not at  [facility])  Result Value Ref Range   Lactic Acid, Venous 0.92 0.5 - 1.9 mmol/L  I-stat chem 8, ed  Result Value Ref Range   Sodium 136 135 - 145 mmol/L   Potassium 4.0 3.5 - 5.1 mmol/L   Chloride 102 101 - 111 mmol/L   BUN 23 (H) 6 - 20 mg/dL   Creatinine, Ser 1.50 (H) 0.61 - 1.24 mg/dL   Glucose, Bld 137 (H) 65 - 99 mg/dL   Calcium, Ion 1.17 1.15 - 1.40 mmol/L   TCO2 22 22 - 32 mmol/L   Hemoglobin 15.0 13.0 - 17.0 g/dL   HCT 44.0 39.0 - 52.0 %  I-stat troponin, ED  Result Value Ref Range   Troponin i, poc 0.02 0.00 - 0.08 ng/mL   Comment 3            Dg Chest 2 View Result Date: 05/31/2017 CLINICAL DATA:  71 year old male code sepsis. Cough fever and malaise. EXAM: CHEST  2 VIEW COMPARISON:  04/26/2017 and earlier. FINDINGS: Sitting Upright AP and lateral views of the chest. Lordotic AP view. Stable cardiac size and mediastinal contours. Prior CABG. No pneumothorax, pulmonary edema, pleural effusion or confluent pulmonary opacity. Patchy right lung opacity seen in September appears resolved. Osteopenia. No acute osseous abnormality identified. Negative visible bowel gas pattern; paucity of  gas. IMPRESSION: No acute cardiopulmonary abnormality. Electronically Signed   By: Genevie Ann M.D.   On: 05/31/2017 17:12   Ct Head Wo Contrast Result Date: 05/31/2017 CLINICAL DATA:  Syncope EXAM: CT HEAD WITHOUT CONTRAST TECHNIQUE: Contiguous axial images were obtained from the base of the skull through the vertex without intravenous contrast. COMPARISON:  Brain MRI 04/26/2017 FINDINGS: Brain: No mass lesion, intraparenchymal hemorrhage or extra-axial collection. No evidence of acute cortical infarct. Mild periventricular white matter hypoattenuation. Mild generalized atrophy. Vascular: No hyperdense vessel or unexpected calcification. Skull: Normal visualized skull base, calvarium and extracranial soft tissues. Sinuses/Orbits: No sinus fluid levels or advanced mucosal thickening. No mastoid effusion. Normal orbits. IMPRESSION: Mild generalized atrophy without acute abnormality. Electronically Signed   By: Ulyses Jarred M.D.   On: 05/31/2017 18:31  Sepsis - Repeat Assessment Performed at:    [facility]     Blood pressure 114/65, pulse (!) 107, temperature 98.3 F (36.8 C), temperature source Oral, resp. rate 18, height 5\' 6"  (1.676 m), weight 72.6 kg (160 lb), SpO2 93 %. Heart:     Tachycardic Lungs:    CTA Capillary Refill:   <2 sec Peripheral Pulse:   Radial pulse palpable Skin:     Normal Color   1845:  Rectal temp taken with fever 103.5. Code sepsis called. IVF 30mg /kg ordered given SBP 90. IV abx given after BC and UC obtained. Flu swab pending. Oral cavity without obvious infection/edema and no facial swelling, but given pt's dental procedure today (cavity x2 filled), will order CT max-facial for completeness. Pt remains NAD, A&O, resps easy, abd soft/NT. Dx and testing d/w pt and family.  Questions answered.  Verb understanding, agreeable to admit.  T/C to [facility] Dr. Adair Patter, case discussed, including:  HPI, pertinent PM/SHx, VS/PE, dx testing, ED course and treatment:  Agreeable to  admit.     Final Clinical Impressions(s) / ED Diagnoses   Final diagnoses:  None    New Prescriptions New Prescriptions   No medications on file     Francine Graven, DO 06/02/17 1737

## 2017-05-31 NOTE — H&P (Signed)
TRH H&P    Patient Demographics:    [name], is a 71 y.o. male  MRN: [id number]  DOB - 25-Jul-1946  Admit Date - 05/31/2017  Referring MD/NP/PA: Dr. Thurnell Garbe  Outpatient Primary MD for the patient is [name], MD  Patient coming from: home  Chief Complaint  Patient presents with  . Fever      HPI:    [name]  is a 71 y.o. male, with history of CAD status post PCI to circumflex, diabetes mellitus, history of CABG 5, hypertension, peripheral vascular disease who was brought to the hospitalafter patient had episode of shaking chills that began this afternoon. Earlierpatient went to his dentist to get both teeth filled before the symptoms began. Patient was not having any symptoms prior to that.  He denies chest pain, no shortness of breath. No coughing up any phlegm. He denies nausea vomiting or diarrhea. He denies dysuria, urgency, frequency of urination. Denies abdominal pain Denies history of seizure or passing out. No history of stroke  In the ED, patient arrived,and was having sepsis physiology with hypotension, tachycardia, fever temp 103.5 Patient was started on vancomycin and Zosyn per pharmacy consultation Lactic acid was 2.04, repeat lactic acid 0.92   Review of systems:      All other systems reviewed and are negative.   With Past History of the following :    Past Medical History:  Diagnosis Date  . CAD S/P percutaneous coronary angioplasty 1998   PCI TO CX  . Diabetes mellitus without complication ([place])   . GERD (gastroesophageal reflux disease)   . Headache   . 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 Hypercholesteremia   . Hypertension   . Neuropathy   . Peripheral vascular disease ([place])    s/p R-L FEM-FEM BYPASS  . Tachycardia    after CABG, pt on medicine for this      Past Surgical History:  Procedure Laterality Date  . ABDOMINAL AORTOGRAM Bilateral 09/19/2016     Procedure: iliac;  Surgeon: Serafina Mitchell, MD;  Location: [facility];  Service: Cardiovascular;  Laterality: Bilateral;  . CARDIAC CATHETERIZATION  2003   with stent  . CARDIAC CATHETERIZATION N/A 10/11/2015   Procedure: Left Heart Cath and Coronary Angiography;  Surgeon: Peter M Martinique, MD;  Location: [facility];  Service: Cardiovascular;  Laterality: N/A;  . COLONOSCOPY N/A 09/22/2013   Procedure: COLONOSCOPY;  Surgeon: Daneil Dolin, MD;  [facility];  Service: Endoscopy;  Laterality: N/A;  9:30 AM  . CORONARY ARTERY BYPASS GRAFT N/A 10/18/2015   Procedure: CORONARY ARTERY BYPASS GRAFTING (CABG) x  five, using left internal mammary artery and right leg greater saphenous vein harvested endoscopically;  Surgeon: [name], MD;  Location: [facility];  Service: Open Heart Surgery;  Laterality: N/A;  . ENDARTERECTOMY FEMORAL Right 08/09/2016   Procedure: ENDARTERECTOMY FEMORAL WITH VEIN PATCH ANGIOPLASTY;  Surgeon: Serafina Mitchell, MD;  Location: [facility];  Service: Vascular;  Laterality: Right;  . FEMORAL-FEMORAL  BYPASS GRAFT Bilateral 08/09/2016   Procedure: REVISION BYPASS GRAFT RIGHT FEMORAL-LEFT FEMORAL ARTERY;  Surgeon: Serafina Mitchell, MD;  Location: [facility];  Service: Vascular;  Laterality: Bilateral;  . FEMORAL-POPLITEAL BYPASS GRAFT    . PERIPHERAL VASCULAR CATHETERIZATION N/A 05/08/2016   Procedure: Lower Extremity Angiography;  Surgeon: Lorretta Harp, MD;  Location: [facility];  Service: Cardiovascular;  Laterality: N/A;  . PERIPHERAL VASCULAR INTERVENTION Right 09/19/2016   Procedure: Peripheral Vascular Intervention;  Surgeon: Serafina Mitchell, MD;  Location: [facility];  Service: Cardiovascular;  Laterality: Right;  ext iliac stent  . TEE WITHOUT CARDIOVERSION N/A 10/18/2015   Procedure: TRANSESOPHAGEAL ECHOCARDIOGRAM (TEE);  Surgeon: [name], MD;  Location: [facility];  Service: Open Heart Surgery;  Laterality: N/A;      Social  History:      Social History  Substance Use Topics  . Smoking status: Former Smoker    Packs/day: 2.00    Years: 30.00    Types: Cigarettes    Quit date: 08/14/2000  . Smokeless tobacco: Never Used  . Alcohol use No       Family History :     Family History  Problem Relation Age of Onset  . Heart attack Mother   . Stroke Mother   . Colon cancer Neg Hx       Home Medications:   Prior to Admission medications   Medication Sig Start Date End Date Taking? Authorizing Provider  amLODipine (NORVASC) 10 MG tablet Take 10 mg by mouth daily.   Yes [provider]  aspirin 81 MG tablet Take 81 mg by mouth daily.   Yes [provider]  Cholecalciferol (VITAMIN D) 2000 UNITS tablet Take 2,000 Units by mouth daily.   Yes [provider]  cilostazol (PLETAL) 100 MG tablet Take 100 mg by mouth 2 (two) times daily.   Yes [provider]  ferrous sulfate 325 (65 FE) MG tablet Take 325-650 mg by mouth 2 (two) times daily with a meal. 650mg  in the morning and 325mg  in the evening    Yes [provider]  gabapentin (NEURONTIN) 300 MG capsule Take 300 mg by mouth 3 (three) times daily.    Yes [provider]  insulin glargine (LANTUS) 100 UNIT/ML injection Give 10 units before bedtime each day. Patient taking differently: 20 Units at bedtime. Give 10 units before bedtime each day. 01/20/17 01/20/18 Yes Orvan Falconer, MD  metoprolol tartrate (LOPRESSOR) 25 MG tablet Take 1 tablet (25 mg total) by mouth 2 (two) times daily. 01/16/17  Yes Almyra Deforest, PA  pantoprazole (PROTONIX) 40 MG tablet Take 40 mg by mouth daily.   Yes [provider]  simvastatin (ZOCOR) 40 MG tablet Take 20 mg by mouth at bedtime.    Yes [provider]  doxycycline (VIBRA-TABS) 100 MG tablet Take 1 tablet (100 mg total) by mouth 2 (two) times daily. Patient not taking: Reported on 05/31/2017 04/26/17   Francine Graven, DO     Allergies:     Allergies    Allergen Reactions  . No Known Allergies      Physical Exam:   Vitals  Blood pressure 107/65, pulse (!) 105, temperature 98.3 F (36.8 C), temperature source Oral, resp. rate 19, height 5\' 6"  (1.676 m), weight 72.6 kg (160 lb), SpO2 93 %.  1.  General: Appears in no acute distress  2. Psychiatric:  Intact judgement and  insight, awake alert, oriented x 3.  3.  Neurologic: No focal neurological deficits, all cranial nerves intact.Strength 5/5 all 4 extremities, sensation intact all 4 extremities, plantars down going.  4. Eyes :  anicteric sclerae, moist conjunctivae with no lid lag. PERRLA.  5. ENMT:  Oropharynx clear with moist mucous membranes and good dentition  6. Neck:  supple, no cervical lymphadenopathy appriciated, No thyromegaly  7. Respiratory : Normal respiratory effort, good air movement bilaterally,clear to  auscultation bilaterally  8. Cardiovascular : RRR, no gallops, rubs or murmurs, no leg edema  9. Gastrointestinal:  Positive bowel sounds, abdomen soft, non-tender to palpation,no hepatosplenomegaly, no rigidity or guarding       10. Skin:  No cyanosis, normal texture and turgor, no rash, lesions or ulcers  11.Musculoskeletal:  Good muscle tone,  joints appear normal , no effusions,  normal range of motion    Data Review:    CBC  Recent Labs Lab 05/31/17 1553 05/31/17 1854  WBC 6.9  --   HGB 13.8 15.0  HCT 42.8 44.0  PLT 315  --   MCV 87.2  --   MCH 28.1  --   MCHC 32.2  --   RDW 15.1  --   LYMPHSABS 0.2*  --   MONOABS 0.0*  --   EOSABS 0.1  --   BASOSABS 0.0  --    ------------------------------------------------------------------------------------------------------------------  Chemistries   Recent Labs Lab 05/31/17 1553 05/31/17 1854  NA 135 136  K 3.8 4.0  CL 102 102  CO2 22  --   GLUCOSE 151* 137*  BUN 22* 23*  CREATININE 1.54* 1.50*  CALCIUM 9.0  --   AST 15  --   ALT 11*  --   ALKPHOS 74  --   BILITOT 0.6  --     ------------------------------------------------------------------------------------------------------------------  ------------------------------------------------------------------------------------------------------------------ GFR: Estimated Creatinine Clearance: 41.9 mL/min (A) (by C-G formula based on SCr of 1.5 mg/dL (H)). Liver Function Tests:  Recent Labs Lab 05/31/17 1553  AST 15  ALT 11*  ALKPHOS 74  BILITOT 0.6  PROT 6.9  ALBUMIN 3.6   No results for input(s): LIPASE, AMYLASE in the last 168 hours. No results for input(s): AMMONIA in the last 168 hours. Coagulation Profile: No results for input(s): INR, PROTIME in the last 168 hours. Cardiac Enzymes:  Recent Labs Lab 05/31/17 1553  TROPONINI <0.03   BNP (last 3 results) No results for input(s): PROBNP in the last 8760 hours. HbA1C: No results for input(s): HGBA1C in the last 72 hours. CBG: No results for input(s): GLUCAP in the last 168 hours. Lipid Profile: No results for input(s): CHOL, HDL, LDLCALC, TRIG, CHOLHDL, LDLDIRECT in the last 72 hours. Thyroid Function Tests: No results for input(s): TSH, T4TOTAL, FREET4, T3FREE, THYROIDAB in the last 72 hours. Anemia Panel: No results for input(s): VITAMINB12, FOLATE, FERRITIN, TIBC, IRON, RETICCTPCT in the last 72 hours.  --------------------------------------------------------------------------------------------------------------- Urine analysis:    Component Value Date/Time   COLORURINE YELLOW 05/31/2017 [place] 05/31/2017 1544   LABSPEC 1.016 05/31/2017 1544   PHURINE 5.0 05/31/2017 1544   GLUCOSEU >=500 (A) 05/31/2017 1544   HGBUR NEGATIVE 05/31/2017 [place] 05/31/2017 1544   KETONESUR NEGATIVE 05/31/2017 1544   PROTEINUR NEGATIVE 05/31/2017 1544   UROBILINOGEN 0.2 03/16/2015 0120   NITRITE NEGATIVE 05/31/2017 1544   LEUKOCYTESUR NEGATIVE 05/31/2017 1544      Imaging Results:    Dg Chest 2  View  Result Date: 05/31/2017 CLINICAL DATA:  71 year old male code sepsis. Cough fever and malaise.  EXAM: CHEST  2 VIEW COMPARISON:  04/26/2017 and earlier. FINDINGS: Sitting Upright AP and lateral views of the chest. Lordotic AP view. Stable cardiac size and mediastinal contours. Prior CABG. No pneumothorax, pulmonary edema, pleural effusion or confluent pulmonary opacity. Patchy right lung opacity seen in September appears resolved. Osteopenia. No acute osseous abnormality identified. Negative visible bowel gas pattern; paucity of gas. IMPRESSION: No acute cardiopulmonary abnormality. Electronically Signed   By: Genevie Ann M.D.   On: 05/31/2017 17:12   Ct Head Wo Contrast  Result Date: 05/31/2017 CLINICAL DATA:  Syncope EXAM: CT HEAD WITHOUT CONTRAST TECHNIQUE: Contiguous axial images were obtained from the base of the skull through the vertex without intravenous contrast. COMPARISON:  Brain MRI 04/26/2017 FINDINGS: Brain: No mass lesion, intraparenchymal hemorrhage or extra-axial collection. No evidence of acute cortical infarct. Mild periventricular white matter hypoattenuation. Mild generalized atrophy. Vascular: No hyperdense vessel or unexpected calcification. Skull: Normal visualized skull base, calvarium and extracranial soft tissues. Sinuses/Orbits: No sinus fluid levels or advanced mucosal thickening. No mastoid effusion. Normal orbits. IMPRESSION: Mild generalized atrophy without acute abnormality. Electronically Signed   By: Ulyses Jarred M.D.   On: 05/31/2017 18:31    My personal review of EKG: Rhythm NSR   Assessment & Plan:    Active Problems:   Sepsis ([place])   SIRS (systemic inflammatory response syndrome) ([place])   1. SIRS- patient presented with SIRS, no clear source of infection.blood and urine cultures have been obtained. Patient currently on vancomycin and Zosyn per pharmacy. Follow culture results. Patient this morning had dental procedure, prior to the symptoms.this may be the  source of  ? Bacteremia. Chest x-ray showed no acute abnormality. UA is clear. 2. CAD- stable, continue aspirin, metoprolol 3. Peripheral vascular disease- stable, continue Pletal. 4. Diabetes mellitus-continue Lantus, will initiate sliding scale insulin with NovoLog.   DVT Prophylaxis-   Lovenox   AM Labs Ordered, also please review Full Orders  Family Communication: Admission, patients condition and plan of care including tests being ordered have been discussed with the patient and his grandsons at bedside who indicate understanding and agree with the plan and Code Status.  Code Status:  Full code  Admission status: inpatient  Time spent in minutes : 60 minutes   Maylynn Orzechowski S M.D on 05/31/2017 at 8:07 PM  Between 7am to 7pm - Pager - [phone]. After 7pm go to [email or web address] - password [facility]  [facility] - Office  [phone]

## 2017-05-31 NOTE — ED Notes (Signed)
Pt daughter at bedside and reports pt arrived home post dental filling x2 and reported chills and pale. Pt daughter was driving pt to hospital and reports pt stated to pullover and call EMS "something wasn't right." pt daughter reported pts "eyes rolled back in head" and haven't acted right since. Pt daughter reports cbg at home 185.

## 2017-05-31 NOTE — Progress Notes (Signed)
Pharmacy Note:  Initial antibiotics for Vancomycin and Zosyn ordered by EDP for sepsis.  Estimated Creatinine Clearance: 41.9 mL/min (A) (by C-G formula based on SCr of 1.5 mg/dL (H)).   Allergies  Allergen Reactions  . No Known Allergies     Vitals:   05/31/17 1926 05/31/17 1930  BP:  107/65  Pulse:  (!) 105  Resp:  19  Temp:    SpO2: 94% 93%    Anti-infectives    Start     Dose/Rate Route Frequency Ordered Stop   06/01/17 0100  piperacillin-tazobactam (ZOSYN) IVPB 3.375 g     3.375 g 12.5 mL/hr over 240 Minutes Intravenous Every 8 hours 05/31/17 2041     05/31/17 1630  piperacillin-tazobactam (ZOSYN) IVPB 3.375 g     3.375 g 100 mL/hr over 30 Minutes Intravenous  Once 05/31/17 1619 05/31/17 1752   05/31/17 1630  vancomycin (VANCOCIN) IVPB 1000 mg/200 mL premix     1,000 mg 200 mL/hr over 60 Minutes Intravenous  Once 05/31/17 1619 05/31/17 2005     Plan: Initial doses of Zosyn 3.375gm and Vancomycin 1000mg  X 1 ordered. F/U admission orders for further dosing if therapy continued.  Kidus, [place], [facility] 05/31/2017 8:42 PM

## 2017-05-31 NOTE — ED Notes (Signed)
ED Provider at bedside. 

## 2017-05-31 NOTE — ED Triage Notes (Signed)
Pt brought in via EMS after having two teeth filled. Pt reports general malaise and fever ever since. Pt alert.

## 2017-06-01 ENCOUNTER — Encounter (HOSPITAL_COMMUNITY): Payer: Self-pay | Admitting: Student

## 2017-06-01 LAB — COMPREHENSIVE METABOLIC PANEL
ALK PHOS: 53 U/L (ref 38–126)
ALT: 12 U/L — AB (ref 17–63)
AST: 13 U/L — ABNORMAL LOW (ref 15–41)
Albumin: 2.8 g/dL — ABNORMAL LOW (ref 3.5–5.0)
Anion gap: 8 (ref 5–15)
BILIRUBIN TOTAL: 0.6 mg/dL (ref 0.3–1.2)
BUN: 18 mg/dL (ref 6–20)
CALCIUM: 8.2 mg/dL — AB (ref 8.9–10.3)
CHLORIDE: 107 mmol/L (ref 101–111)
CO2: 21 mmol/L — ABNORMAL LOW (ref 22–32)
CREATININE: 1.22 mg/dL (ref 0.61–1.24)
GFR, EST NON AFRICAN AMERICAN: 59 mL/min — AB (ref >60)
Glucose, Bld: 78 mg/dL (ref 65–99)
Potassium: 3.7 mmol/L (ref 3.5–5.1)
Sodium: 136 mmol/L (ref 135–145)
TOTAL PROTEIN: 5.4 g/dL — AB (ref 6.5–8.1)

## 2017-06-01 LAB — GLUCOSE, CAPILLARY
GLUCOSE-CAPILLARY: 168 mg/dL — AB (ref 65–99)
Glucose-Capillary: 217 mg/dL — ABNORMAL HIGH (ref 65–99)

## 2017-06-01 LAB — CBC
HEMATOCRIT: 36.9 % — AB (ref 39.0–52.0)
Hemoglobin: 12 g/dL — ABNORMAL LOW (ref 13.0–17.0)
MCH: 28.6 pg (ref 26.0–34.0)
MCHC: 32.5 g/dL (ref 30.0–36.0)
MCV: 87.9 fL (ref 78.0–100.0)
PLATELETS: 254 10*3/uL (ref 150–400)
RBC: 4.2 MIL/uL — AB (ref 4.22–5.81)
RDW: 15.2 % (ref 11.5–15.5)
WBC: 10.1 10*3/uL (ref 4.0–10.5)

## 2017-06-01 LAB — HEMOGLOBIN A1C
Hgb A1c MFr Bld: 9.8 % — ABNORMAL HIGH (ref 4.8–5.6)
Mean Plasma Glucose: 234.56 mg/dL

## 2017-06-01 LAB — MRSA PCR SCREENING: MRSA by PCR: NEGATIVE

## 2017-06-01 LAB — CBG MONITORING, ED
GLUCOSE-CAPILLARY: 100 mg/dL — AB (ref 65–99)
GLUCOSE-CAPILLARY: 95 mg/dL (ref 65–99)

## 2017-06-01 MED ORDER — VANCOMYCIN HCL IN DEXTROSE 1-5 GM/200ML-% IV SOLN
1000.0000 mg | Freq: Two times a day (BID) | INTRAVENOUS | Status: DC
  Administered 2017-06-01 – 2017-06-02 (×2): 1000 mg via INTRAVENOUS
  Filled 2017-06-01 (×2): qty 200

## 2017-06-01 MED ORDER — VANCOMYCIN HCL IN DEXTROSE 1-5 GM/200ML-% IV SOLN
1000.0000 mg | Freq: Once | INTRAVENOUS | Status: AC
  Administered 2017-06-01: 1000 mg via INTRAVENOUS
  Filled 2017-06-01: qty 200

## 2017-06-01 MED ORDER — INFLUENZA VAC SPLIT HIGH-DOSE 0.5 ML IM SUSY
0.5000 mL | PREFILLED_SYRINGE | INTRAMUSCULAR | Status: AC
  Administered 2017-06-02: 0.5 mL via INTRAMUSCULAR
  Filled 2017-06-01: qty 0.5

## 2017-06-01 NOTE — ED Notes (Signed)
AC aware of meds needed.  

## 2017-06-01 NOTE — Care Management Important Message (Signed)
Important Message  Patient Details  Name: DEQUAVIOUS HARSHBERGER MRN: [id number] Date of Birth: 1946/07/13   Medicare Important Message Given:  Yes    Sherald Barge, RN 06/01/2017, 3:54 PM

## 2017-06-01 NOTE — Progress Notes (Signed)
Pharmacy Antibiotic Note  [name] is a 71 y.o. male admitted on 05/31/2017 with sepsis.  Pharmacy has been consulted for [name] dosing.  Plan:  Vancomycin 1000mg  IV q12h Check trough at steady state Zosyn 3.375gm IV q8h, EID Monitor labs, renal fxn, progress and c/s Deescalate ABX when improved / appropriate.    Height: 5\' 6"  (167.6 cm) Weight: 160 lb (72.6 kg) IBW/kg (Calculated) : 63.8  Temp (24hrs), Avg:99.6 F (37.6 C), Min:97.9 F (36.6 C), Max:103.5 F (39.7 C)   Recent Labs Lab 05/31/17 1553 05/31/17 1608 05/31/17 1854 05/31/17 1939 06/01/17 0525  WBC 6.9  --   --   --  10.1  CREATININE 1.54*  --  1.50*  --  1.22  LATICACIDVEN  --  2.04*  --  0.92  --     Estimated Creatinine Clearance: 51.6 mL/min (by C-G formula based on SCr of 1.22 mg/dL).    Allergies  Allergen Reactions  . No Known Allergies    Antimicrobials this admission: Vancomycin 10/18 >>  Zosyn 10/18 >>   Dose adjustments this admission:  Microbiology results:  BCx: pending  UCx: pending   Sputum:    MRSA PCR:   Thank you for allowing pharmacy to be a part of this patient's care.  Malik, Paar A 06/01/2017 12:11 PM

## 2017-06-01 NOTE — ED Notes (Signed)
Ambulated pt to the bathroom

## 2017-06-01 NOTE — Progress Notes (Signed)
PROGRESS NOTE    LYNWOOD KUBISIAK  RDE:[id number] DOB: [birth year] DOA: 05/31/2017 PCP: [name], MD    Brief Narrative:  [name]  is a 71 y.o. male, with history of CAD status post PCI to circumflex, diabetes mellitus, history of CABG 5, hypertension, peripheral vascular disease who was brought to the hospitalafter patient had episode of shaking chills that began this afternoon. Earlierpatient went to his dentist to get both teeth filled before the symptoms began. Patient was not having any symptoms prior to that.  He denies chest pain, no shortness of breath. No coughing up any phlegm. He denies nausea vomiting or diarrhea. He denies dysuria, urgency, frequency of urination. Denies abdominal pain Denies history of seizure or passing out. No history of stroke  In the ED, patient arrived,and was having sepsis physiology with hypotension, tachycardia, fever temp 103.5 Patient was started on vancomycin and Zosyn per pharmacy consultation Lactic acid was 2.04, repeat lactic acid 0.92   Assessment & Plan:   Active Problems:   Sepsis ([place])   Hypotension   Type 2 diabetes mellitus (HCC)   CKD (chronic kidney disease) stage 2, GFR 60-89 ml/min   SIRS (systemic inflammatory response syndrome) (HCC)  SIRS - vancomycin and Zosyn per pharmacy - Follow culture results - Chest x-ray showed no acute abnormality - UA is clear.  CAD - stable - continue aspirin - metoprolol  Peripheral vascular disease - stable - continue Pletal.  Diabetes mellitus - continue Lantus - SSI.    DVT prophylaxis: lovenox Code Status: Full code Family Communication: no family bedside Disposition Plan: likely discharge in the next day or 2   Consultants:   none  Procedures:   none  Antimicrobials:   Vancomycin 10/19  Zosyn 10/19    Subjective: Patient feeling better today.  Doesn't remember what happened yesterday but knows where he is and what day it  is.  Objective: Vitals:   06/01/17 1430 06/01/17 1500 06/01/17 1530 06/01/17 1559  BP: (!) 150/64 (!) 146/56 (!) 146/75   Pulse: 90 61 60   Resp: 17 15 19    Temp:      TempSrc:      SpO2: 98% 96% 95%   Weight:    73.7 kg (162 lb 7.7 oz)  Height:    5\' 6"  (1.676 m)    Intake/Output Summary (Last 24 hours) at 06/01/17 1641 Last data filed at 06/01/17 1625  Gross per 24 hour  Intake             3750 ml  Output              300 ml  Net             3450 ml   Filed Weights   05/31/17 1531 06/01/17 1559  Weight: 72.6 kg (160 lb) 73.7 kg (162 lb 7.7 oz)    Examination:  General exam: Appears calm and comfortable  Respiratory system: Clear to auscultation. Respiratory effort normal. Cardiovascular system: S1 & S2 heard, RRR. No JVD, murmurs, rubs, gallops or clicks. No pedal edema. Gastrointestinal system: Abdomen is nondistended, soft and nontender. No organomegaly or masses felt. Normal bowel sounds heard. Central nervous system: Alert and oriented. No focal neurological deficits. Extremities: Symmetric 5 x 5 power. Skin: No rashes, lesions or ulcers Psychiatry: Judgement and insight appear normal. Mood & affect appropriate.     Data Reviewed: I have personally reviewed following labs and imaging studies  CBC:  Recent Labs Lab 05/31/17 1553  05/31/17 1854 06/01/17 0525  WBC 6.9  --  10.1  NEUTROABS 6.6  --   --   HGB 13.8 15.0 12.0*  HCT 42.8 44.0 36.9*  MCV 87.2  --  87.9  PLT 315  --  008   Basic Metabolic Panel:  Recent Labs Lab 05/31/17 1553 05/31/17 1854 06/01/17 0525  NA 135 136 136  K 3.8 4.0 3.7  CL 102 102 107  CO2 22  --  21*  GLUCOSE 151* 137* 78  BUN 22* 23* 18  CREATININE 1.54* 1.50* 1.22  CALCIUM 9.0  --  8.2*   GFR: Estimated Creatinine Clearance: 51.6 mL/min (by C-G formula based on SCr of 1.22 mg/dL). Liver Function Tests:  Recent Labs Lab 05/31/17 1553 06/01/17 0525  AST 15 13*  ALT 11* 12*  ALKPHOS 74 53  BILITOT 0.6 0.6   PROT 6.9 5.4*  ALBUMIN 3.6 2.8*   No results for input(s): LIPASE, AMYLASE in the last 168 hours. No results for input(s): AMMONIA in the last 168 hours. Coagulation Profile: No results for input(s): INR, PROTIME in the last 168 hours. Cardiac Enzymes:  Recent Labs Lab 05/31/17 1553  TROPONINI <0.03   BNP (last 3 results) No results for input(s): PROBNP in the last 8760 hours. HbA1C:  Recent Labs  05/31/17 1553  HGBA1C 9.8*   CBG:  Recent Labs Lab 06/01/17 0031 06/01/17 0851  GLUCAP 100* 95   Lipid Profile: No results for input(s): CHOL, HDL, LDLCALC, TRIG, CHOLHDL, LDLDIRECT in the last 72 hours. Thyroid Function Tests: No results for input(s): TSH, T4TOTAL, FREET4, T3FREE, THYROIDAB in the last 72 hours. Anemia Panel: No results for input(s): VITAMINB12, FOLATE, FERRITIN, TIBC, IRON, RETICCTPCT in the last 72 hours. Sepsis Labs:  Recent Labs Lab 05/31/17 1608 05/31/17 1939  LATICACIDVEN 2.04* 0.92    Recent Results (from the past 240 hour(s))  Blood Culture (routine x 2)     Status: None (Preliminary result)   Collection Time: 05/31/17  3:53 PM  Result Value Ref Range Status   Specimen Description LEFT ANTECUBITAL  Final   Special Requests   Final    BOTTLES DRAWN AEROBIC AND ANAEROBIC Blood Culture adequate volume   Culture NO GROWTH < 24 HOURS  Final   Report Status PENDING  Incomplete  Blood Culture (routine x 2)     Status: None (Preliminary result)   Collection Time: 05/31/17  3:57 PM  Result Value Ref Range Status   Specimen Description RIGHT ANTECUBITAL  Final   Special Requests   Final    BOTTLES DRAWN AEROBIC AND ANAEROBIC Blood Culture adequate volume   Culture NO GROWTH < 24 HOURS  Final   Report Status PENDING  Incomplete         Radiology Studies: Dg Chest 2 View  Result Date: 05/31/2017 CLINICAL DATA:  71 year old male code sepsis. Cough fever and malaise. EXAM: CHEST  2 VIEW COMPARISON:  04/26/2017 and earlier. FINDINGS:  Sitting Upright AP and lateral views of the chest. Lordotic AP view. Stable cardiac size and mediastinal contours. Prior CABG. No pneumothorax, pulmonary edema, pleural effusion or confluent pulmonary opacity. Patchy right lung opacity seen in September appears resolved. Osteopenia. No acute osseous abnormality identified. Negative visible bowel gas pattern; paucity of gas. IMPRESSION: No acute cardiopulmonary abnormality. Electronically Signed   By: Genevie Ann M.D.   On: 05/31/2017 17:12   Ct Head Wo Contrast  Result Date: 05/31/2017 CLINICAL DATA:  Syncope EXAM: CT HEAD WITHOUT CONTRAST TECHNIQUE: Contiguous axial  images were obtained from the base of the skull through the vertex without intravenous contrast. COMPARISON:  Brain MRI 04/26/2017 FINDINGS: Brain: No mass lesion, intraparenchymal hemorrhage or extra-axial collection. No evidence of acute cortical infarct. Mild periventricular white matter hypoattenuation. Mild generalized atrophy. Vascular: No hyperdense vessel or unexpected calcification. Skull: Normal visualized skull base, calvarium and extracranial soft tissues. Sinuses/Orbits: No sinus fluid levels or advanced mucosal thickening. No mastoid effusion. Normal orbits. IMPRESSION: Mild generalized atrophy without acute abnormality. Electronically Signed   By: Ulyses Jarred M.D.   On: 05/31/2017 18:31   Ct Maxillofacial Wo Cm  Result Date: 05/31/2017 CLINICAL DATA:  Fever and chills after going to dentist. History of hypertension, hyperlipidemia and diabetes. EXAM: CT MAXILLOFACIAL WITHOUT CONTRAST TECHNIQUE: Multidetector CT imaging of the maxillofacial structures was performed. Multiplanar CT image reconstructions were also generated. COMPARISON:  CT HEAD May 31, 2016 at 1724 hours FINDINGS: OSSEOUS: The mandible is intact, the condyles are located. No acute facial fracture. No destructive bony lesions. No CT findings of dental pathology. Multiple absent teeth. Unerupted RIGHT maxillary  and LEFT mandible molars. ORBITS: Ocular globes and orbital contents are normal. SINUSES: Trace paranasal sinus mucosal thickening without air-fluid levels. Nasal septum is midline. Mastoid aircells are well aerated. SOFT TISSUES: No significant soft tissue swelling. No subcutaneous gas or radiopaque foreign bodies. Moderate calcific atherosclerosis carotid bifurcations. Subcentimeter intraparotid lymph nodes. LIMITED INTRACRANIAL: Nonacute. Please see CT of chest from same day, reported separately for dedicated findings. IMPRESSION: No acute dental pathology or acute process in the face by noncontrast CT. Electronically Signed   By: Elon Alas M.D.   On: 05/31/2017 21:04        Scheduled Meds: . aspirin  81 mg Oral Daily  . cilostazol  100 mg Oral BID  . enoxaparin (LOVENOX) injection  40 mg Subcutaneous Q24H  . gabapentin  300 mg Oral TID  . insulin aspart  0-9 Units Subcutaneous TID WC  . insulin glargine  20 Units Subcutaneous QHS  . metoprolol tartrate  25 mg Oral BID  . pantoprazole  40 mg Oral Daily  . simvastatin  20 mg Oral QHS   Continuous Infusions: . sodium chloride Stopped (06/01/17 0313)  . piperacillin-tazobactam (ZOSYN)  IV Stopped (06/01/17 0505)  . vancomycin       LOS: 1 day    Time spent: 30 minutes    Loretha Stapler, MD [facility] Pager [phone]  If 7PM-7AM, please contact night-coverage [email or web address] Password [email or web address] 06/01/2017, 4:41 PM

## 2017-06-01 NOTE — ED Notes (Signed)
Report to Lucy, RN

## 2017-06-02 DIAGNOSIS — Z23 Encounter for immunization: Secondary | ICD-10-CM | POA: Diagnosis not present

## 2017-06-02 DIAGNOSIS — A419 Sepsis, unspecified organism: Principal | ICD-10-CM

## 2017-06-02 LAB — GLUCOSE, CAPILLARY
GLUCOSE-CAPILLARY: 232 mg/dL — AB (ref 65–99)
Glucose-Capillary: 109 mg/dL — ABNORMAL HIGH (ref 65–99)

## 2017-06-02 LAB — URINE CULTURE: Culture: <10000 — AB

## 2017-06-02 MED ORDER — AMOXICILLIN-POT CLAVULANATE 875-125 MG PO TABS
1.0000 | ORAL_TABLET | Freq: Two times a day (BID) | ORAL | Status: DC
  Administered 2017-06-02: 1 via ORAL
  Filled 2017-06-02: qty 1

## 2017-06-02 MED ORDER — VANCOMYCIN HCL IN DEXTROSE 750-5 MG/150ML-% IV SOLN
750.0000 mg | Freq: Two times a day (BID) | INTRAVENOUS | Status: DC
  Filled 2017-06-02 (×4): qty 150

## 2017-06-02 MED ORDER — AMOXICILLIN-POT CLAVULANATE 875-125 MG PO TABS: 1.0000 | ORAL_TABLET | Freq: Two times a day (BID) | ORAL | 0 refills | Status: AC

## 2017-06-02 NOTE — Discharge Summary (Signed)
Physician Discharge Summary  [name]:[id number] DOB: [birth year] DOA: 05/31/2017  PCP: [name], MD  Admit date: 05/31/2017 Discharge date: 06/02/2017  Admitted From: Home  Disposition:  Home  Recommendations for Outpatient Follow-up:  1. Follow up with PCP in 1-2 weeks 2. Take prescriptions as prescribed 3. Please obtain BMP/CBC in one week  Home Health: No Equipment/Devices: None  Discharge Condition: Stable CODE STATUS: Full code Diet recommendation: Heart Healthy  Brief/Interim Summary: [name] a 71 y.o.male,with history of CAD status post PCI to circumflex, diabetes mellitus, history of CABG 5, hypertension, peripheral vascular disease who was brought to the hospitalafter patient had episode of shaking chills that began this afternoon. Earlierpatient went to his dentist to get both teeth filled before the symptoms began. Patient was not having any symptoms prior to that.  He denies chest pain, no shortness of breath. No coughing up any phlegm. He denies nausea vomiting or diarrhea. He denies dysuria, urgency, frequency of urination. Denies abdominal pain. Denies history of seizure or passing out. No history of stroke  In the ED, patient arrived,and was having sepsis physiology with hypotension, tachycardia, fever temp 103.5. Patient was started on vancomycin and Zosyn per pharmacy consultation. Lactic acid was 2.04, repeat lactic acid 0.92.  Patient was given broad spectrum antibiotics and was fluid resuscitated.  He improved and was transitioned to PO antibiotics on day of discharge.  He was given return precautions.   Discharge Diagnoses:  Active Problems:   Sepsis (HCC)   Hypotension   Type 2 diabetes mellitus (HCC)   CKD (chronic kidney disease) stage 2, GFR 60-89 ml/min   SIRS (systemic inflammatory response syndrome) ([place])    Discharge Instructions  Discharge Instructions    Call MD for:  difficulty breathing, headache or visual  disturbances    Complete by:  As directed    Call MD for:  extreme fatigue    Complete by:  As directed    Call MD for:  hives    Complete by:  As directed    Call MD for:  persistant dizziness or light-headedness    Complete by:  As directed    Call MD for:  persistant nausea and vomiting    Complete by:  As directed    Call MD for:  redness, tenderness, or signs of infection (pain, swelling, redness, odor or green/yellow discharge around incision site)    Complete by:  As directed    Call MD for:  severe uncontrolled pain    Complete by:  As directed    Call MD for:  temperature >100.4    Complete by:  As directed    Diet - low sodium heart healthy    Complete by:  As directed    Discharge instructions    Complete by:  As directed    Take antibiotic as prescribed Follow up with your PCP within this week   Increase activity slowly    Complete by:  As directed      Allergies as of 06/02/2017      Reactions   No Known Allergies       Medication List    STOP taking these medications   doxycycline 100 MG tablet Commonly known as:  VIBRA-TABS     TAKE these medications   amLODipine 10 MG tablet Commonly known as:  NORVASC Take 10 mg by mouth daily.   amoxicillin-clavulanate 875-125 MG tablet Commonly known as:  AUGMENTIN Take 1 tablet by mouth every 12 (twelve) hours.  aspirin 81 MG tablet Take 81 mg by mouth daily.   cilostazol 100 MG tablet Commonly known as:  PLETAL Take 100 mg by mouth 2 (two) times daily.   ferrous sulfate 325 (65 FE) MG tablet Take 325-650 mg by mouth 2 (two) times daily with a meal. 650mg  in the morning and 325mg  in the evening   gabapentin 300 MG capsule Commonly known as:  NEURONTIN Take 300 mg by mouth 3 (three) times daily.   insulin glargine 100 UNIT/ML injection Commonly known as:  LANTUS Give 10 units before bedtime each day. What changed:  how much to take  when to take this  additional instructions   metoprolol  tartrate 25 MG tablet Commonly known as:  LOPRESSOR Take 1 tablet (25 mg total) by mouth 2 (two) times daily.   pantoprazole 40 MG tablet Commonly known as:  PROTONIX Take 40 mg by mouth daily.   simvastatin 40 MG tablet Commonly known as:  ZOCOR Take 20 mg by mouth at bedtime.   Vitamin D 2000 units tablet Take 2,000 Units by mouth daily.      Follow-up Information    [name], MD. Schedule an appointment as soon as possible for a visit in 1 week(s).   Specialty:  Nurse Practitioner Contact information: [place] [address] [phone]          Allergies  Allergen Reactions  . No Known Allergies     Consultations:  None   Procedures/Studies: Dg Chest 2 View  Result Date: 05/31/2017 CLINICAL DATA:  71 year old male code sepsis. Cough fever and malaise. EXAM: CHEST  2 VIEW COMPARISON:  04/26/2017 and earlier. FINDINGS: Sitting Upright AP and lateral views of the chest. Lordotic AP view. Stable cardiac size and mediastinal contours. Prior CABG. No pneumothorax, pulmonary edema, pleural effusion or confluent pulmonary opacity. Patchy right lung opacity seen in September appears resolved. Osteopenia. No acute osseous abnormality identified. Negative visible bowel gas pattern; paucity of gas. IMPRESSION: No acute cardiopulmonary abnormality. Electronically Signed   By: Genevie Ann M.D.   On: 05/31/2017 17:12   Ct Head Wo Contrast  Result Date: 05/31/2017 CLINICAL DATA:  Syncope EXAM: CT HEAD WITHOUT CONTRAST TECHNIQUE: Contiguous axial images were obtained from the base of the skull through the vertex without intravenous contrast. COMPARISON:  Brain MRI 04/26/2017 FINDINGS: Brain: No mass lesion, intraparenchymal hemorrhage or extra-axial collection. No evidence of acute cortical infarct. Mild periventricular white matter hypoattenuation. Mild generalized atrophy. Vascular: No hyperdense vessel or unexpected calcification. Skull: Normal visualized  skull base, calvarium and extracranial soft tissues. Sinuses/Orbits: No sinus fluid levels or advanced mucosal thickening. No mastoid effusion. Normal orbits. IMPRESSION: Mild generalized atrophy without acute abnormality. Electronically Signed   By: Ulyses Jarred M.D.   On: 05/31/2017 18:31   Ct Maxillofacial Wo Cm  Result Date: 05/31/2017 CLINICAL DATA:  Fever and chills after going to dentist. History of hypertension, hyperlipidemia and diabetes. EXAM: CT MAXILLOFACIAL WITHOUT CONTRAST TECHNIQUE: Multidetector CT imaging of the maxillofacial structures was performed. Multiplanar CT image reconstructions were also generated. COMPARISON:  CT HEAD May 31, 2016 at 1724 hours FINDINGS: OSSEOUS: The mandible is intact, the condyles are located. No acute facial fracture. No destructive bony lesions. No CT findings of dental pathology. Multiple absent teeth. Unerupted RIGHT maxillary and LEFT mandible molars. ORBITS: Ocular globes and orbital contents are normal. SINUSES: Trace paranasal sinus mucosal thickening without air-fluid levels. Nasal septum is midline. Mastoid aircells are well aerated. SOFT TISSUES: No significant  soft tissue swelling. No subcutaneous gas or radiopaque foreign bodies. Moderate calcific atherosclerosis carotid bifurcations. Subcentimeter intraparotid lymph nodes. LIMITED INTRACRANIAL: Nonacute. Please see CT of chest from same day, reported separately for dedicated findings. IMPRESSION: No acute dental pathology or acute process in the face by noncontrast CT. Electronically Signed   By: Elon Alas M.D.   On: 05/31/2017 21:04      Subjective: Patient is doing well.  Has eaten well.  Vitals stable.  Wants to discharge.  Understands return precautions.  Discharge Exam: Vitals:   06/02/17 0734 06/02/17 0800  BP:  (!) 148/60  Pulse:  (!) 57  Resp:  15  Temp: 97.9 F (36.6 C)   SpO2:  94%   Vitals:   06/02/17 0600 06/02/17 0700 06/02/17 0734 06/02/17 0800  BP: (!)  128/57 (!) 141/69  (!) 148/60  Pulse: 68 60  (!) 57  Resp: 16 17  15   Temp:   97.9 F (36.6 C)   TempSrc:   Oral   SpO2: 92% 92%  94%  Weight:      Height:        General: Pt is alert, awake, not in acute distress Cardiovascular: RRR, S1/S2 +, no rubs, no gallops Respiratory: CTA bilaterally, no wheezing, no rhonchi Abdominal: Soft, NT, ND, bowel sounds + Extremities: no edema, no cyanosis    The results of significant diagnostics from this hospitalization (including imaging, microbiology, ancillary and laboratory) are listed below for reference.     Microbiology: Recent Results (from the past 240 hour(s))  Urine culture     Status: Abnormal   Collection Time: 05/31/17  3:44 PM  Result Value Ref Range Status   Specimen Description URINE, RANDOM  Final   Special Requests NONE  Final   Culture (A)  Final    <10,000 COLONIES/mL INSIGNIFICANT GROWTH Performed at [facility], [place] [address] [place], [place] [address]    Report Status 06/02/2017 FINAL  Final  Blood Culture (routine x 2)     Status: None (Preliminary result)   Collection Time: 05/31/17  3:53 PM  Result Value Ref Range Status   Specimen Description LEFT ANTECUBITAL  Final   Special Requests   Final    BOTTLES DRAWN AEROBIC AND ANAEROBIC Blood Culture adequate volume   Culture NO GROWTH < 24 HOURS  Final   Report Status PENDING  Incomplete  Blood Culture (routine x 2)     Status: None (Preliminary result)   Collection Time: 05/31/17  3:57 PM  Result Value Ref Range Status   Specimen Description RIGHT ANTECUBITAL  Final   Special Requests   Final    BOTTLES DRAWN AEROBIC AND ANAEROBIC Blood Culture adequate volume   Culture NO GROWTH < 24 HOURS  Final   Report Status PENDING  Incomplete  MRSA PCR Screening     Status: None   Collection Time: 06/01/17  4:05 PM  Result Value Ref Range Status   MRSA by PCR NEGATIVE NEGATIVE Final    Comment:        The GeneXpert MRSA Assay (FDA approved for NASAL  specimens only), is one component of a comprehensive MRSA colonization surveillance program. It is not intended to diagnose MRSA infection nor to guide or monitor treatment for MRSA infections.      Labs: BNP (last 3 results) No results for input(s): BNP in the last 8760 hours. Basic Metabolic Panel:  Recent Labs Lab 05/31/17 1553 05/31/17 1854 06/01/17 0525  NA 135 136  136  K 3.8 4.0 3.7  CL 102 102 107  CO2 22  --  21*  GLUCOSE 151* 137* 78  BUN 22* 23* 18  CREATININE 1.54* 1.50* 1.22  CALCIUM 9.0  --  8.2*   Liver Function Tests:  Recent Labs Lab 05/31/17 1553 06/01/17 0525  AST 15 13*  ALT 11* 12*  ALKPHOS 74 53  BILITOT 0.6 0.6  PROT 6.9 5.4*  ALBUMIN 3.6 2.8*   No results for input(s): LIPASE, AMYLASE in the last 168 hours. No results for input(s): AMMONIA in the last 168 hours. CBC:  Recent Labs Lab 05/31/17 1553 05/31/17 1854 06/01/17 0525  WBC 6.9  --  10.1  NEUTROABS 6.6  --   --   HGB 13.8 15.0 12.0*  HCT 42.8 44.0 36.9*  MCV 87.2  --  87.9  PLT 315  --  254   Cardiac Enzymes:  Recent Labs Lab 05/31/17 1553  TROPONINI <0.03   BNP: Invalid input(s): POCBNP CBG:  Recent Labs Lab 06/01/17 0031 06/01/17 0851 06/01/17 1716 06/01/17 2148 06/02/17 0737  GLUCAP 100* 95 168* 217* 109*   D-Dimer No results for input(s): DDIMER in the last 72 hours. Hgb A1c  Recent Labs  05/31/17 1553  HGBA1C 9.8*   Lipid Profile No results for input(s): CHOL, HDL, LDLCALC, TRIG, CHOLHDL, LDLDIRECT in the last 72 hours. Thyroid function studies No results for input(s): TSH, T4TOTAL, T3FREE, THYROIDAB in the last 72 hours.  Invalid input(s): FREET3 Anemia work up No results for input(s): VITAMINB12, FOLATE, FERRITIN, TIBC, IRON, RETICCTPCT in the last 72 hours. Urinalysis    Component Value Date/Time   COLORURINE YELLOW 05/31/2017 [place] 05/31/2017 1544   LABSPEC 1.016 05/31/2017 1544   PHURINE 5.0 05/31/2017 1544    GLUCOSEU >=500 (A) 05/31/2017 1544   HGBUR NEGATIVE 05/31/2017 [place] 05/31/2017 1544   KETONESUR NEGATIVE 05/31/2017 1544   PROTEINUR NEGATIVE 05/31/2017 1544   UROBILINOGEN 0.2 03/16/2015 0120   NITRITE NEGATIVE 05/31/2017 1544   LEUKOCYTESUR NEGATIVE 05/31/2017 1544   Sepsis Labs Invalid input(s): PROCALCITONIN,  WBC,  LACTICIDVEN Microbiology Recent Results (from the past 240 hour(s))  Urine culture     Status: Abnormal   Collection Time: 05/31/17  3:44 PM  Result Value Ref Range Status   Specimen Description URINE, RANDOM  Final   Special Requests NONE  Final   Culture (A)  Final    <10,000 COLONIES/mL INSIGNIFICANT GROWTH Performed at [facility], [place] [address], [place], [place] [address]    Report Status 06/02/2017 FINAL  Final  Blood Culture (routine x 2)     Status: None (Preliminary result)   Collection Time: 05/31/17  3:53 PM  Result Value Ref Range Status   Specimen Description LEFT ANTECUBITAL  Final   Special Requests   Final    BOTTLES DRAWN AEROBIC AND ANAEROBIC Blood Culture adequate volume   Culture NO GROWTH < 24 HOURS  Final   Report Status PENDING  Incomplete  Blood Culture (routine x 2)     Status: None (Preliminary result)   Collection Time: 05/31/17  3:57 PM  Result Value Ref Range Status   Specimen Description RIGHT ANTECUBITAL  Final   Special Requests   Final    BOTTLES DRAWN AEROBIC AND ANAEROBIC Blood Culture adequate volume   Culture NO GROWTH < 24 HOURS  Final   Report Status PENDING  Incomplete  MRSA PCR Screening     Status: None  Collection Time: 06/01/17  4:05 PM  Result Value Ref Range Status   MRSA by PCR NEGATIVE NEGATIVE Final    Comment:        The GeneXpert MRSA Assay (FDA approved for NASAL specimens only), is one component of a comprehensive MRSA colonization surveillance program. It is not intended to diagnose MRSA infection nor to guide or monitor treatment for MRSA infections.       Time coordinating discharge: 35 minutes  SIGNED:   Loretha Stapler, MD  [facility] 06/02/2017, 11:06 AM Pager [phone] If 7PM-7AM, please contact night-coverage [email or web address] Password [email or web address]

## 2017-06-02 NOTE — Progress Notes (Signed)
Pharmacy Antibiotic Note  [name] is a 71 y.o. male admitted on 05/31/2017 with sepsis.  Pharmacy has been consulted for Allyn dosing.  Plan:  Change Vancomycin to 750 mg IV every 12 hours Check trough at steady state Continue Zosyn 3.375gm IV q8h, EID Monitor labs, renal fxn, progress and c/s Deescalate ABX when improved / appropriate.    Height: 5\' 6"  (167.6 cm) Weight: 160 lb 4.4 oz (72.7 kg) IBW/kg (Calculated) : 63.8  Temp (24hrs), Avg:98.5 F (36.9 C), Min:97.9 F (36.6 C), Max:98.9 F (37.2 C)   Recent Labs Lab 05/31/17 1553 05/31/17 1608 05/31/17 1854 05/31/17 1939 06/01/17 0525  WBC 6.9  --   --   --  10.1  CREATININE 1.54*  --  1.50*  --  1.22  LATICACIDVEN  --  2.04*  --  0.92  --     Estimated Creatinine Clearance: 51.6 mL/min (by C-G formula based on SCr of 1.22 mg/dL).    Allergies  Allergen Reactions  . No Known Allergies    Antimicrobials this admission: Vancomycin 10/18 >>  Zosyn 10/18 >>   Dose adjustments this admission: 10/20  Vancomycin reduced to 750 mg IV every 12 hours Microbiology results:  BCx: pending  UCx: pending   Sputum:    MRSA PCR:   Thank you for allowing pharmacy to be a part of this patient's care.  Chriss Czar 06/02/2017 10:15 AM

## 2017-06-02 NOTE — Progress Notes (Signed)
Patient is alert and oriented. Vital signs are stable. Saline lock removed. No complaints of any distress. Discharge instructions given and discussed with patient and his daughter. Patient left floor ambulatory with daughter. Discharged home.

## 2017-06-02 NOTE — Plan of Care (Signed)
Problem: Tissue Perfusion: Goal: Risk factors for ineffective tissue perfusion will decrease Outcome: Progressing Pt receiving lovenox for dvt prevention   

## 2017-06-04 ENCOUNTER — Observation Stay (HOSPITAL_COMMUNITY)
Admission: AD | Admit: 2017-06-04 | Discharge: 2017-06-05 | Disposition: A | Discharge status: Home / Self Care | Payer: Medicare Other | Source: Ambulatory Visit | Attending: Internal Medicine | Admitting: Internal Medicine

## 2017-06-04 DIAGNOSIS — Z794 Long term (current) use of insulin: Secondary | ICD-10-CM | POA: Insufficient documentation

## 2017-06-04 DIAGNOSIS — Z7982 Long term (current) use of aspirin: Secondary | ICD-10-CM | POA: Diagnosis not present

## 2017-06-04 DIAGNOSIS — I2510 Atherosclerotic heart disease of native coronary artery without angina pectoris: Secondary | ICD-10-CM | POA: Diagnosis not present

## 2017-06-04 DIAGNOSIS — Z87891 Personal history of nicotine dependence: Secondary | ICD-10-CM | POA: Diagnosis not present

## 2017-06-04 DIAGNOSIS — E1140 Type 2 diabetes mellitus with diabetic neuropathy, unspecified: Secondary | ICD-10-CM | POA: Insufficient documentation

## 2017-06-04 DIAGNOSIS — I10 Essential (primary) hypertension: Secondary | ICD-10-CM | POA: Insufficient documentation

## 2017-06-04 DIAGNOSIS — B9620 Unspecified Escherichia coli [E. coli] as the cause of diseases classified elsewhere: Secondary | ICD-10-CM | POA: Insufficient documentation

## 2017-06-04 DIAGNOSIS — Z951 Presence of aortocoronary bypass graft: Secondary | ICD-10-CM | POA: Insufficient documentation

## 2017-06-04 DIAGNOSIS — R7881 Bacteremia: Principal | ICD-10-CM | POA: Diagnosis present

## 2017-06-04 DIAGNOSIS — Z79899 Other long term (current) drug therapy: Secondary | ICD-10-CM | POA: Insufficient documentation

## 2017-06-04 DIAGNOSIS — E119 Type 2 diabetes mellitus without complications: Secondary | ICD-10-CM

## 2017-06-04 NOTE — Progress Notes (Signed)
Got a call from the lab, the patient was discharged on 06/02/2017 has 1 out of 2 blood cultures growing gram-negative rods.  Patient was discharged on Augmentin.  I called and told the patient's daughter, that he needs to come back to the hospital for IV antibiotics.   Daughter agrees to bring patient back to hospital.

## 2017-06-05 DIAGNOSIS — R7881 Bacteremia: Secondary | ICD-10-CM | POA: Diagnosis not present

## 2017-06-05 DIAGNOSIS — E1140 Type 2 diabetes mellitus with diabetic neuropathy, unspecified: Secondary | ICD-10-CM | POA: Diagnosis not present

## 2017-06-05 DIAGNOSIS — B9620 Unspecified Escherichia coli [E. coli] as the cause of diseases classified elsewhere: Secondary | ICD-10-CM | POA: Diagnosis not present

## 2017-06-05 DIAGNOSIS — Z7982 Long term (current) use of aspirin: Secondary | ICD-10-CM | POA: Diagnosis not present

## 2017-06-05 DIAGNOSIS — I2510 Atherosclerotic heart disease of native coronary artery without angina pectoris: Secondary | ICD-10-CM | POA: Diagnosis not present

## 2017-06-05 DIAGNOSIS — E119 Type 2 diabetes mellitus without complications: Secondary | ICD-10-CM

## 2017-06-05 DIAGNOSIS — I10 Essential (primary) hypertension: Secondary | ICD-10-CM | POA: Diagnosis not present

## 2017-06-05 DIAGNOSIS — Z79899 Other long term (current) drug therapy: Secondary | ICD-10-CM | POA: Diagnosis not present

## 2017-06-05 DIAGNOSIS — Z794 Long term (current) use of insulin: Secondary | ICD-10-CM | POA: Diagnosis not present

## 2017-06-05 DIAGNOSIS — Z87891 Personal history of nicotine dependence: Secondary | ICD-10-CM | POA: Diagnosis not present

## 2017-06-05 DIAGNOSIS — Z951 Presence of aortocoronary bypass graft: Secondary | ICD-10-CM | POA: Diagnosis not present

## 2017-06-05 LAB — GLUCOSE, CAPILLARY
GLUCOSE-CAPILLARY: 280 mg/dL — AB (ref 65–99)
GLUCOSE-CAPILLARY: 205 mg/dL — AB (ref 65–99)
Glucose-Capillary: 308 mg/dL — ABNORMAL HIGH (ref 65–99)

## 2017-06-05 LAB — CBC
HEMATOCRIT: 42.4 % (ref 39.0–52.0)
HEMATOCRIT: 42 % (ref 39.0–52.0)
HEMOGLOBIN: 13.7 g/dL (ref 13.0–17.0)
Hemoglobin: 13.5 g/dL (ref 13.0–17.0)
MCH: 28.4 pg (ref 26.0–34.0)
MCH: 28.5 pg (ref 26.0–34.0)
MCHC: 32.3 g/dL (ref 30.0–36.0)
MCHC: 32.1 g/dL (ref 30.0–36.0)
MCV: 87.8 fL (ref 78.0–100.0)
MCV: 88.6 fL (ref 78.0–100.0)
PLATELETS: 316 10*3/uL (ref 150–400)
Platelets: 310 10*3/uL (ref 150–400)
RBC: 4.83 MIL/uL (ref 4.22–5.81)
RBC: 4.74 MIL/uL (ref 4.22–5.81)
RDW: 15.1 % (ref 11.5–15.5)
RDW: 15.3 % (ref 11.5–15.5)
WBC: 7.5 10*3/uL (ref 4.0–10.5)
WBC: 6.6 10*3/uL (ref 4.0–10.5)

## 2017-06-05 LAB — COMPREHENSIVE METABOLIC PANEL
ALBUMIN: 3.6 g/dL (ref 3.5–5.0)
ALK PHOS: 68 U/L (ref 38–126)
ALT: 13 U/L — ABNORMAL LOW (ref 17–63)
ALT: 12 U/L — AB (ref 17–63)
ANION GAP: 10 (ref 5–15)
AST: 13 U/L — ABNORMAL LOW (ref 15–41)
AST: 12 U/L — AB (ref 15–41)
Albumin: 3.3 g/dL — ABNORMAL LOW (ref 3.5–5.0)
Alkaline Phosphatase: 70 U/L (ref 38–126)
Anion gap: 10 (ref 5–15)
BILIRUBIN TOTAL: 0.3 mg/dL (ref 0.3–1.2)
BUN: 22 mg/dL — ABNORMAL HIGH (ref 6–20)
BUN: 21 mg/dL — AB (ref 6–20)
CALCIUM: 9.2 mg/dL (ref 8.9–10.3)
CALCIUM: 9.1 mg/dL (ref 8.9–10.3)
CHLORIDE: 100 mmol/L — AB (ref 101–111)
CO2: 25 mmol/L (ref 22–32)
CO2: 27 mmol/L (ref 22–32)
CREATININE: 1.61 mg/dL — AB (ref 0.61–1.24)
Chloride: 99 mmol/L — ABNORMAL LOW (ref 101–111)
Creatinine, Ser: 1.71 mg/dL — ABNORMAL HIGH (ref 0.61–1.24)
GFR calc Af Amer: 45 mL/min — ABNORMAL LOW (ref >60)
GFR calc non Af Amer: 39 mL/min — ABNORMAL LOW (ref >60)
GFR, EST AFRICAN AMERICAN: 49 mL/min — AB (ref >60)
GFR, EST NON AFRICAN AMERICAN: 42 mL/min — AB (ref >60)
GLUCOSE: 290 mg/dL — AB (ref 65–99)
Glucose, Bld: 222 mg/dL — ABNORMAL HIGH (ref 65–99)
Potassium: 4.1 mmol/L (ref 3.5–5.1)
Potassium: 4.3 mmol/L (ref 3.5–5.1)
SODIUM: 134 mmol/L — AB (ref 135–145)
Sodium: 137 mmol/L (ref 135–145)
TOTAL PROTEIN: 6.4 g/dL — AB (ref 6.5–8.1)
Total Bilirubin: 0.4 mg/dL (ref 0.3–1.2)
Total Protein: 7.1 g/dL (ref 6.5–8.1)

## 2017-06-05 LAB — BLOOD CULTURE ID PANEL (REFLEXED)
Acinetobacter baumannii: NOT DETECTED
CANDIDA GLABRATA: NOT DETECTED
CANDIDA KRUSEI: NOT DETECTED
CANDIDA PARAPSILOSIS: NOT DETECTED
Candida albicans: NOT DETECTED
Candida tropicalis: NOT DETECTED
Carbapenem resistance: NOT DETECTED
ENTEROCOCCUS SPECIES: NOT DETECTED
ESCHERICHIA COLI: DETECTED — AB
Enterobacter cloacae complex: NOT DETECTED
Enterobacteriaceae species: DETECTED — AB
Haemophilus influenzae: NOT DETECTED
KLEBSIELLA OXYTOCA: NOT DETECTED
KLEBSIELLA PNEUMONIAE: NOT DETECTED
LISTERIA MONOCYTOGENES: NOT DETECTED
Neisseria meningitidis: NOT DETECTED
Proteus species: NOT DETECTED
Pseudomonas aeruginosa: NOT DETECTED
SERRATIA MARCESCENS: NOT DETECTED
STAPHYLOCOCCUS SPECIES: NOT DETECTED
STREPTOCOCCUS PNEUMONIAE: NOT DETECTED
STREPTOCOCCUS PYOGENES: NOT DETECTED
Staphylococcus aureus (BCID): NOT DETECTED
Streptococcus agalactiae: NOT DETECTED
Streptococcus species: NOT DETECTED

## 2017-06-05 LAB — CULTURE, BLOOD (ROUTINE X 2)
CULTURE: NO GROWTH
SPECIAL REQUESTS: ADEQUATE

## 2017-06-05 LAB — LACTIC ACID, PLASMA
Lactic Acid, Venous: 1.2 mmol/L (ref 0.5–1.9)
Lactic Acid, Venous: 1.2 mmol/L (ref 0.5–1.9)

## 2017-06-05 MED ORDER — CILOSTAZOL 100 MG PO TABS
100.0000 mg | ORAL_TABLET | Freq: Two times a day (BID) | ORAL | Status: DC
  Administered 2017-06-05: 100 mg via ORAL
  Filled 2017-06-05 (×4): qty 1

## 2017-06-05 MED ORDER — INSULIN ASPART 100 UNIT/ML ~~LOC~~ SOLN
0.0000 [IU] | Freq: Three times a day (TID) | SUBCUTANEOUS | Status: DC
  Administered 2017-06-05: 3 [IU] via SUBCUTANEOUS
  Administered 2017-06-05: 7 [IU] via SUBCUTANEOUS

## 2017-06-05 MED ORDER — ONDANSETRON HCL 4 MG/2ML IJ SOLN: 4.0000 mg | Freq: Four times a day (QID) | INTRAMUSCULAR | Status: DC | PRN

## 2017-06-05 MED ORDER — DEXTROSE 5 % IV SOLN
2.0000 g | INTRAVENOUS | Status: DC
  Filled 2017-06-05: qty 2

## 2017-06-05 MED ORDER — INSULIN GLARGINE 100 UNIT/ML ~~LOC~~ SOLN
20.0000 [IU] | Freq: Every day | SUBCUTANEOUS | Status: DC
  Filled 2017-06-05 (×2): qty 0.2

## 2017-06-05 MED ORDER — ACETAMINOPHEN 325 MG PO TABS: 650.0000 mg | ORAL_TABLET | Freq: Four times a day (QID) | ORAL | Status: DC | PRN

## 2017-06-05 MED ORDER — AMLODIPINE BESYLATE 5 MG PO TABS
10.0000 mg | ORAL_TABLET | Freq: Every day | ORAL | Status: DC
  Administered 2017-06-05: 10 mg via ORAL
  Filled 2017-06-05: qty 2

## 2017-06-05 MED ORDER — GABAPENTIN 300 MG PO CAPS
300.0000 mg | ORAL_CAPSULE | Freq: Three times a day (TID) | ORAL | Status: DC
  Administered 2017-06-05 (×2): 300 mg via ORAL
  Filled 2017-06-05 (×2): qty 1

## 2017-06-05 MED ORDER — METRONIDAZOLE IN NACL 5-0.79 MG/ML-% IV SOLN
500.0000 mg | Freq: Three times a day (TID) | INTRAVENOUS | Status: DC
  Administered 2017-06-05 (×2): 500 mg via INTRAVENOUS
  Filled 2017-06-05 (×2): qty 100

## 2017-06-05 MED ORDER — ASPIRIN EC 81 MG PO TBEC
81.0000 mg | DELAYED_RELEASE_TABLET | Freq: Every day | ORAL | Status: DC
  Administered 2017-06-05: 81 mg via ORAL
  Filled 2017-06-05: qty 1

## 2017-06-05 MED ORDER — PANTOPRAZOLE SODIUM 40 MG PO TBEC
40.0000 mg | DELAYED_RELEASE_TABLET | Freq: Every day | ORAL | Status: DC
  Administered 2017-06-05: 40 mg via ORAL
  Filled 2017-06-05: qty 1

## 2017-06-05 MED ORDER — METOPROLOL TARTRATE 25 MG PO TABS
25.0000 mg | ORAL_TABLET | Freq: Two times a day (BID) | ORAL | Status: DC
  Administered 2017-06-05: 25 mg via ORAL
  Filled 2017-06-05: qty 1

## 2017-06-05 MED ORDER — DEXTROSE 5 % IV SOLN
2.0000 g | Freq: Once | INTRAVENOUS | Status: AC
  Administered 2017-06-05: 2 g via INTRAVENOUS
  Filled 2017-06-05 (×2): qty 2

## 2017-06-05 MED ORDER — ACETAMINOPHEN 650 MG RE SUPP: 650.0000 mg | Freq: Four times a day (QID) | RECTAL | Status: DC | PRN

## 2017-06-05 MED ORDER — ONDANSETRON HCL 4 MG PO TABS: 4.0000 mg | ORAL_TABLET | Freq: Four times a day (QID) | ORAL | Status: DC | PRN

## 2017-06-05 MED ORDER — SIMVASTATIN 20 MG PO TABS: 20.0000 mg | ORAL_TABLET | Freq: Every day | ORAL | Status: DC

## 2017-06-05 MED ORDER — ENOXAPARIN SODIUM 40 MG/0.4ML ~~LOC~~ SOLN
40.0000 mg | SUBCUTANEOUS | Status: DC
  Administered 2017-06-05: 40 mg via SUBCUTANEOUS
  Filled 2017-06-05: qty 0.4

## 2017-06-05 MED ORDER — SODIUM CHLORIDE 0.9 % IV SOLN
INTRAVENOUS | Status: DC
  Administered 2017-06-05: 01:00:00 via INTRAVENOUS

## 2017-06-05 NOTE — Discharge Summary (Signed)
Physician Discharge Summary  [name] YQM:[id number] DOB: 30-Oct-1945 DOA: 06/04/2017  PCP: [name], MD  Admit date: 06/04/2017 Discharge date: 06/05/2017  Admitted From: home  Disposition:  home  Recommendations for Outpatient Follow-up:  1.  Patient advised to return to the hospital if he feels fevers, has nausea, rigors or any other concerning symptoms.   2.  Continue Augmentin 3.  F/u final sensitivities of blood culture  Home Health:  none  Equipment/Devices:  None  Discharge Condition:  Stable, improved CODE STATUS: Full code Diet recommendation: Diabetic diet  Brief/Interim Summary:  The patient is a 71 year old male with history of coronary artery disease, diabetes mellitus type 2, hypertension, peripheral vascular disease who was recently admitted with sepsis thought to be either due to an early UTI or because he had a dental procedure done on the date of admission.  He was started on broad-spectrum antibiotics and discharged home on Augmentin.  He was called to return to the hospital after 1 out of 2 bottles from the time of admission grew gram-negative rods.  He was readmitted and started on ceftriaxone.  He has continued to feel very well and back to his baseline without fevers, chills, dysuria, cough, shortness of breath, vomiting or diarrhea.  His blood culture has been speciated to E. coli and sensitivities are pending.  The patient is clinically well-appearing and can be discharged to complete his course of Augmentin.  I will follow-up on the results of his sensitivities and if he needs to change to a different antibiotic this can be called in to a local pharmacy.  Discharge Diagnoses:  Active Problems:   Bacteremia  E. coli bacteremia, unclear source may have been due to early UTI versus secondary to his dental procedure.  Upon further review, it appears he only had 2 fillings done during his dental procedure and they did not do much Manipulation and he did not  have any bleeding from his gums during the procedure.  He is advised that we are not 100% sure where the source of his bacteremia is from.  He does not have elevated LFTs or right upper quadrant pain to suspect gallbladder disease and the rest of his abdomen is soft so I doubt that he has smoldering diverticulitis.  We will treat for a total of 7 days.  If he has recurrent bacteremia without a clear source again, I would recommend doing a CT of the chest abdomen and pelvis to see if he has any other obvious source of infection.  The rest of his chronic medical problems remained stable and no other changes were made to his medications.  Discharge Instructions  Discharge Instructions    Call MD for:  difficulty breathing, headache or visual disturbances    Complete by:  As directed    Call MD for:  extreme fatigue    Complete by:  As directed    Call MD for:  persistant dizziness or light-headedness    Complete by:  As directed    Call MD for:  persistant nausea and vomiting    Complete by:  As directed    Call MD for:  severe uncontrolled pain    Complete by:  As directed    Call MD for:  temperature >100.4    Complete by:  As directed    Diet Carb Modified    Complete by:  As directed    Increase activity slowly    Complete by:  As directed  Medication List    TAKE these medications   amLODipine 10 MG tablet Commonly known as:  NORVASC Take 10 mg by mouth daily.   amoxicillin-clavulanate 875-125 MG tablet Commonly known as:  AUGMENTIN Take 1 tablet by mouth every 12 (twelve) hours.   aspirin 81 MG tablet Take 81 mg by mouth daily.   cilostazol 100 MG tablet Commonly known as:  PLETAL Take 100 mg by mouth 2 (two) times daily.   ferrous sulfate 325 (65 FE) MG tablet Take 325-650 mg by mouth 2 (two) times daily with a meal. 650mg  in the morning and 325mg  in the evening   gabapentin 300 MG capsule Commonly known as:  NEURONTIN Take 300 mg by mouth 3 (three) times  daily.   insulin glargine 100 UNIT/ML injection Commonly known as:  LANTUS Give 10 units before bedtime each day. What changed:  how much to take  when to take this  additional instructions   metoprolol tartrate 25 MG tablet Commonly known as:  LOPRESSOR Take 1 tablet (25 mg total) by mouth 2 (two) times daily.   pantoprazole 40 MG tablet Commonly known as:  PROTONIX Take 40 mg by mouth daily.   simvastatin 40 MG tablet Commonly known as:  ZOCOR Take 20 mg by mouth at bedtime.   Vitamin D 2000 units tablet Take 2,000 Units by mouth daily.      Follow-up Information    [name], MD Follow up.   Specialty:  Nurse Practitioner Contact information: [place] [address] [phone]          Allergies  Allergen Reactions  . No Known Allergies     Consultations: none   Procedures/Studies: Dg Chest 2 View  Result Date: 05/31/2017 CLINICAL DATA:  71 year old male code sepsis. Cough fever and malaise. EXAM: CHEST  2 VIEW COMPARISON:  04/26/2017 and earlier. FINDINGS: Sitting Upright AP and lateral views of the chest. Lordotic AP view. Stable cardiac size and mediastinal contours. Prior CABG. No pneumothorax, pulmonary edema, pleural effusion or confluent pulmonary opacity. Patchy right lung opacity seen in September appears resolved. Osteopenia. No acute osseous abnormality identified. Negative visible bowel gas pattern; paucity of gas. IMPRESSION: No acute cardiopulmonary abnormality. Electronically Signed   By: Genevie Ann M.D.   On: 05/31/2017 17:12   Ct Head Wo Contrast  Result Date: 05/31/2017 CLINICAL DATA:  Syncope EXAM: CT HEAD WITHOUT CONTRAST TECHNIQUE: Contiguous axial images were obtained from the base of the skull through the vertex without intravenous contrast. COMPARISON:  Brain MRI 04/26/2017 FINDINGS: Brain: No mass lesion, intraparenchymal hemorrhage or extra-axial collection. No evidence of acute cortical infarct. Mild  periventricular white matter hypoattenuation. Mild generalized atrophy. Vascular: No hyperdense vessel or unexpected calcification. Skull: Normal visualized skull base, calvarium and extracranial soft tissues. Sinuses/Orbits: No sinus fluid levels or advanced mucosal thickening. No mastoid effusion. Normal orbits. IMPRESSION: Mild generalized atrophy without acute abnormality. Electronically Signed   By: Ulyses Jarred M.D.   On: 05/31/2017 18:31   Ct Maxillofacial Wo Cm  Result Date: 05/31/2017 CLINICAL DATA:  Fever and chills after going to dentist. History of hypertension, hyperlipidemia and diabetes. EXAM: CT MAXILLOFACIAL WITHOUT CONTRAST TECHNIQUE: Multidetector CT imaging of the maxillofacial structures was performed. Multiplanar CT image reconstructions were also generated. COMPARISON:  CT HEAD May 31, 2016 at 1724 hours FINDINGS: OSSEOUS: The mandible is intact, the condyles are located. No acute facial fracture. No destructive bony lesions. No CT findings of dental pathology. Multiple absent teeth. Unerupted RIGHT  maxillary and LEFT mandible molars. ORBITS: Ocular globes and orbital contents are normal. SINUSES: Trace paranasal sinus mucosal thickening without air-fluid levels. Nasal septum is midline. Mastoid aircells are well aerated. SOFT TISSUES: No significant soft tissue swelling. No subcutaneous gas or radiopaque foreign bodies. Moderate calcific atherosclerosis carotid bifurcations. Subcentimeter intraparotid lymph nodes. LIMITED INTRACRANIAL: Nonacute. Please see CT of chest from same day, reported separately for dedicated findings. IMPRESSION: No acute dental pathology or acute process in the face by noncontrast CT. Electronically Signed   By: Elon Alas M.D.   On: 05/31/2017 21:04    Subjective: Denies fevers, chills, shakes, vomiting, diarrhea, cough, shortness of breath, dysuria  Discharge Exam: Vitals:   06/05/17 0601 06/05/17 1401  BP: 121/68 131/68  Pulse: 71 68   Resp: 16 18  Temp: 98.4 F (36.9 C) 98.6 F (37 C)  SpO2: 98% 98%   Vitals:   06/05/17 0010 06/05/17 0601 06/05/17 1401  BP: (!) 119/58 121/68 131/68  Pulse: 66 71 68  Resp: 16 16 18   Temp: 98.3 F (36.8 C) 98.4 F (36.9 C) 98.6 F (37 C)  TempSrc: Oral Oral Oral  SpO2: 96% 98% 98%  Weight:  71.4 kg (157 lb 6.5 oz)   Height: 5\' 6"  (1.676 m)      General: Pt is alert, awake, not in acute distress Cardiovascular: RRR, S1/S2 +, no rubs, no gallops Respiratory: CTA bilaterally, no wheezing, no rhonchi Abdominal: Soft, NT, ND, bowel sounds +, no palpable mass Extremities: no edema, no cyanosis    The results of significant diagnostics from this hospitalization (including imaging, microbiology, ancillary and laboratory) are listed below for reference.     Microbiology: Recent Results (from the past 240 hour(s))  Urine culture     Status: Abnormal   Collection Time: 05/31/17  3:44 PM  Result Value Ref Range Status   Specimen Description URINE, RANDOM  Final   Special Requests NONE  Final   Culture (A)  Final    <10,000 COLONIES/mL INSIGNIFICANT GROWTH Performed at [facility], [place] [address], [place], [place] [address]    Report Status 06/02/2017 FINAL  Final  Blood Culture (routine x 2)     Status: None   Collection Time: 05/31/17  3:53 PM  Result Value Ref Range Status   Specimen Description LEFT ANTECUBITAL  Final   Special Requests   Final    BOTTLES DRAWN AEROBIC AND ANAEROBIC Blood Culture adequate volume   Culture NO GROWTH 5 DAYS  Final   Report Status 06/05/2017 FINAL  Final  Blood Culture (routine x 2)     Status: Abnormal (Preliminary result)   Collection Time: 05/31/17  3:57 PM  Result Value Ref Range Status   Specimen Description RIGHT ANTECUBITAL  Final   Special Requests   Final    BOTTLES DRAWN AEROBIC AND ANAEROBIC Blood Culture adequate volume   Culture  Setup Time   Final    GRAM NEGATIVE RODS Gram Stain Report Called to,Read Back By  and Verified With: DR.LAMA ON 06/04/17 AT 2115 BY LOY,C PERFORMED AT [facility]    Culture ESCHERICHIA COLI (A)  Final   Report Status PENDING  Incomplete  Blood Culture ID Panel (Reflexed)     Status: Abnormal   Collection Time: 05/31/17  3:57 PM  Result Value Ref Range Status   Enterococcus species NOT DETECTED NOT DETECTED Final   Listeria monocytogenes NOT DETECTED NOT DETECTED Final   Staphylococcus species NOT DETECTED NOT DETECTED Final  Staphylococcus aureus NOT DETECTED NOT DETECTED Final   Streptococcus species NOT DETECTED NOT DETECTED Final   Streptococcus agalactiae NOT DETECTED NOT DETECTED Final   Streptococcus pneumoniae NOT DETECTED NOT DETECTED Final   Streptococcus pyogenes NOT DETECTED NOT DETECTED Final   Acinetobacter baumannii NOT DETECTED NOT DETECTED Final   Enterobacteriaceae species DETECTED (A) NOT DETECTED Final    Comment: Enterobacteriaceae represent a large family of gram-negative bacteria, not a single organism. CRITICAL RESULT CALLED TO, READ BACK BY AND VERIFIED WITH: JENNIFER MARTIN RN 08/05/17 0200 BEAMJ    Enterobacter cloacae complex NOT DETECTED NOT DETECTED Final   Escherichia coli DETECTED (A) NOT DETECTED Final    Comment: CRITICAL RESULT CALLED TO, READ BACK BY AND VERIFIED WITH: JENNIFER MARTIN RN 06/05/17 0200 BEAMJ    Klebsiella oxytoca NOT DETECTED NOT DETECTED Final   Klebsiella pneumoniae NOT DETECTED NOT DETECTED Final   Proteus species NOT DETECTED NOT DETECTED Final   Serratia marcescens NOT DETECTED NOT DETECTED Final   Carbapenem resistance NOT DETECTED NOT DETECTED Final   Haemophilus influenzae NOT DETECTED NOT DETECTED Final   Neisseria meningitidis NOT DETECTED NOT DETECTED Final   Pseudomonas aeruginosa NOT DETECTED NOT DETECTED Final   Candida albicans NOT DETECTED NOT DETECTED Final   Candida glabrata NOT DETECTED NOT DETECTED Final   Candida krusei NOT DETECTED NOT DETECTED Final   Candida parapsilosis NOT DETECTED NOT  DETECTED Final   Candida tropicalis NOT DETECTED NOT DETECTED Final    Comment: Performed at [facility], [place] [address] [place], [place] [address]  MRSA PCR Screening     Status: None   Collection Time: 06/01/17  4:05 PM  Result Value Ref Range Status   MRSA by PCR NEGATIVE NEGATIVE Final    Comment:        The GeneXpert MRSA Assay (FDA approved for NASAL specimens only), is one component of a comprehensive MRSA colonization surveillance program. It is not intended to diagnose MRSA infection nor to guide or monitor treatment for MRSA infections.      Labs: BNP (last 3 results) No results for input(s): BNP in the last 8760 hours. Basic Metabolic Panel:  Recent Labs Lab 05/31/17 1553 05/31/17 1854 06/01/17 0525 06/05/17 0103 06/05/17 0432  NA 135 136 136 134* 137  K 3.8 4.0 3.7 4.1 4.3  CL 102 102 107 99* 100*  CO2 22  --  21* 25 27  GLUCOSE 151* 137* 78 290* 222*  BUN 22* 23* 18 22* 21*  CREATININE 1.54* 1.50* 1.22 1.71* 1.61*  CALCIUM 9.0  --  8.2* 9.2 9.1   Liver Function Tests:  Recent Labs Lab 05/31/17 1553 06/01/17 0525 06/05/17 0103 06/05/17 0432  AST 15 13* 13* 12*  ALT 11* 12* 13* 12*  ALKPHOS 74 53 70 68  BILITOT 0.6 0.6 0.4 0.3  PROT 6.9 5.4* 7.1 6.4*  ALBUMIN 3.6 2.8* 3.6 3.3*   No results for input(s): LIPASE, AMYLASE in the last 168 hours. No results for input(s): AMMONIA in the last 168 hours. CBC:  Recent Labs Lab 05/31/17 1553 05/31/17 1854 06/01/17 0525 06/05/17 0103 06/05/17 0432  WBC 6.9  --  10.1 7.5 6.6  NEUTROABS 6.6  --   --   --   --   HGB 13.8 15.0 12.0* 13.7 13.5  HCT 42.8 44.0 36.9* 42.4 42.0  MCV 87.2  --  87.9 87.8 88.6  PLT 315  --  254 310 316   Cardiac Enzymes:  Recent Labs  Lab 05/31/17 1553  TROPONINI <0.03   BNP: Invalid input(s): POCBNP CBG:  Recent Labs Lab 06/02/17 0737 06/02/17 1133 06/05/17 0123 06/05/17 0751 06/05/17 1131  GLUCAP 109* 232* 280* 205* 308*   D-Dimer No results  for input(s): DDIMER in the last 72 hours. Hgb A1c No results for input(s): HGBA1C in the last 72 hours. Lipid Profile No results for input(s): CHOL, HDL, LDLCALC, TRIG, CHOLHDL, LDLDIRECT in the last 72 hours. Thyroid function studies No results for input(s): TSH, T4TOTAL, T3FREE, THYROIDAB in the last 72 hours.  Invalid input(s): FREET3 Anemia work up No results for input(s): VITAMINB12, FOLATE, FERRITIN, TIBC, IRON, RETICCTPCT in the last 72 hours. Urinalysis    Component Value Date/Time   COLORURINE YELLOW 05/31/2017 [place] 05/31/2017 1544   LABSPEC 1.016 05/31/2017 1544   PHURINE 5.0 05/31/2017 1544   GLUCOSEU >=500 (A) 05/31/2017 1544   HGBUR NEGATIVE 05/31/2017 [place] 05/31/2017 1544   KETONESUR NEGATIVE 05/31/2017 1544   PROTEINUR NEGATIVE 05/31/2017 1544   UROBILINOGEN 0.2 03/16/2015 0120   NITRITE NEGATIVE 05/31/2017 1544   LEUKOCYTESUR NEGATIVE 05/31/2017 1544   Sepsis Labs Invalid input(s): PROCALCITONIN,  WBC,  LACTICIDVEN   Time coordinating discharge: Over 30 minutes  SIGNED:   Janece Canterbury, MD  [facility] 06/05/2017, 3:35 PM Pager   If 7PM-7AM, please contact night-coverage [email or web address] Password [email or web address]

## 2017-06-05 NOTE — Care Management Obs Status (Signed)
[facility] NOTIFICATION   Patient Details  Name: [name] MRN: [id number] Date of Birth: 10/20/1945   Medicare Observation Status Notification Given:  Yes    Dewain Platz, Chauncey [place], RN 06/05/2017, 10:20 AM

## 2017-06-05 NOTE — Progress Notes (Signed)
Dr. Darrick Meigs text paged for notification of E. Coli + blood culture.

## 2017-06-05 NOTE — Progress Notes (Signed)
ANTIBIOTIC CONSULT NOTE-Preliminary  Pharmacy Consult for ceftriaxone Indication: bacteremia  Allergies  Allergen Reactions  . No Known Allergies     Patient Measurements:   Adjusted Body Weight:   Vital Signs: Temp: 98.3 F (36.8 C) (10/23 0010) Temp Source: Oral (10/23 0010) BP: 119/58 (10/23 0010) Pulse Rate: 66 (10/23 0010)  Labs: No results for input(s): WBC, HGB, PLT, LABCREA, CREATININE in the last 72 hours.  Estimated Creatinine Clearance: 51.6 mL/min (by C-G formula based on SCr of 1.22 mg/dL).  No results for input(s): VANCOTROUGH, VANCOPEAK, VANCORANDOM, GENTTROUGH, GENTPEAK, GENTRANDOM, TOBRATROUGH, TOBRAPEAK, TOBRARND, AMIKACINPEAK, AMIKACINTROU, AMIKACIN in the last 72 hours.   Microbiology: Recent Results (from the past 720 hour(s))  Urine culture     Status: Abnormal   Collection Time: 05/31/17  3:44 PM  Result Value Ref Range Status   Specimen Description URINE, RANDOM  Final   Special Requests NONE  Final   Culture (A)  Final    <10,000 COLONIES/mL INSIGNIFICANT GROWTH Performed at [facility], [address], [place], [place] [address]    Report Status 06/02/2017 FINAL  Final  Blood Culture (routine x 2)     Status: None (Preliminary result)   Collection Time: 05/31/17  3:53 PM  Result Value Ref Range Status   Specimen Description LEFT ANTECUBITAL  Final   Special Requests   Final    BOTTLES DRAWN AEROBIC AND ANAEROBIC Blood Culture adequate volume   Culture NO GROWTH 4 DAYS  Final   Report Status PENDING  Incomplete  Blood Culture (routine x 2)     Status: None (Preliminary result)   Collection Time: 05/31/17  3:57 PM  Result Value Ref Range Status   Specimen Description RIGHT ANTECUBITAL  Final   Special Requests   Final    BOTTLES DRAWN AEROBIC AND ANAEROBIC Blood Culture adequate volume   Culture  Setup Time   Final    GRAM NEGATIVE RODS Gram Stain Report Called to,Read Back By and Verified With: DR.LAMA ON 06/04/17 AT 2115 BY  LOY,C PERFORMED AT [facility] Organism ID to follow Performed at [facility], [place] [address] [place], [place] [address]    Culture NO GROWTH 4 DAYS  Final   Report Status PENDING  Incomplete  MRSA PCR Screening     Status: None   Collection Time: 06/01/17  4:05 PM  Result Value Ref Range Status   MRSA by PCR NEGATIVE NEGATIVE Final    Comment:        The GeneXpert MRSA Assay (FDA approved for NASAL specimens only), is one component of a comprehensive MRSA colonization surveillance program. It is not intended to diagnose MRSA infection nor to guide or monitor treatment for MRSA infections.     Medical History: Past Medical History:  Diagnosis Date  . CAD S/P percutaneous coronary angioplasty 1998   PCI TO CX  . Diabetes mellitus without complication ([place])   . GERD (gastroesophageal reflux disease)   . Headache   . 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 Hypercholesteremia   . Hypertension   . Neuropathy   . Peripheral vascular disease ([place])    s/p R-L FEM-FEM BYPASS  . Tachycardia    after CABG, pt on medicine for this    Medications:  Anti-infectives    Start     Dose/Rate Route Frequency Ordered Stop   06/05/17 0115  cefTRIAXone (ROCEPHIN) 2 g in dextrose 5 % 50 mL IVPB  2 g 100 mL/hr over 30 Minutes Intravenous  Once 06/05/17 0113     06/05/17 0100  metroNIDAZOLE (FLAGYL) IVPB 500 mg     500 mg 100 mL/hr over 60 Minutes Intravenous Every 8 hours 06/05/17 0053        Assessment: 71 yo male discharged from hospiutal 2 days ago after admission for possible sepsis after dental filling.  Today 1 of 2 blood cultures growing gram negative rods.  Admitted for  IV abx. Starting flagyl and ceftriaxone.   Plan:  Preliminary review of pertinent patient information completed.  Protocol will be initiated with dose(s) of ceftriaxone 2 grams x 1.  Forestine Na clinical pharmacist will complete review during morning rounds to assess patient and finalize treatment regimen if  needed.  Lorretta Kerce, [place], [place] 06/05/2017,1:15 AM

## 2017-06-05 NOTE — Progress Notes (Addendum)
Pharmacy Antibiotic Note  [name] is a 71 y.o. male admitted on 06/04/2017 with bacteremia.  Pharmacy has been consulted for Ceftriaxone dosing.  Plan: Ceftriaxone 2gm IV q24h F/U cxs and clinical progress Monitor V/S, labs  Height: 5\' 6"  (167.6 cm) Weight: 157 lb 6.5 oz (71.4 kg) IBW/kg (Calculated) : 63.8  Temp (24hrs), Avg:98.4 F (36.9 C), Min:98.3 F (36.8 C), Max:98.4 F (36.9 C)   Recent Labs Lab 05/31/17 1553 05/31/17 1608 05/31/17 1854 05/31/17 1939 06/01/17 0525 06/05/17 0103 06/05/17 0432  WBC 6.9  --   --   --  10.1 7.5 6.6  CREATININE 1.54*  --  1.50*  --  1.22 1.71* 1.61*  LATICACIDVEN  --  2.04*  --  0.92  --  1.2 1.2    Estimated Creatinine Clearance: 39.1 mL/min (A) (by C-G formula based on SCr of 1.61 mg/dL (H)).    Allergies  Allergen Reactions  . No Known Allergies     Antimicrobials this admission: Ceftriaxone 10/23 >>  Metronidazole 10/23>>  Dose adjustments this admission: N/A  Microbiology results: 10/18 BCx: E.Coli 1 of 2 bottles 10/18 UCx: insignificant growth 10/19 MRSA PCR: negative  Thank you for allowing pharmacy to be a part of this patient's care.  Isac Sarna, BS Pharm D, [place] Clinical Pharmacist Pager [phone] 06/05/2017 7:58 AM

## 2017-06-05 NOTE — Progress Notes (Signed)
Discharge instructions read to patient and his family. All verbalized understanding of instructions.  Discharged to home with family 

## 2017-06-05 NOTE — H&P (Signed)
TRH H&P    Patient Demographics:    [name], is a 71 y.o. male  MRN: [id number]  DOB - 1946-08-11  Admit Date - 06/04/2017    Outpatient Primary MD for the patient is [name], MD  Patient coming from: Home      HPI:    [name]  is a 71 y.o. male, with history of CAD, status post PCI to circumflex, diabetes mellitus, history of CABG , hypertension, PVD who was just discharged from hospital 2 days ago after patient was admitted for possible sepsis after he had a filling done at the dentist office. Today lab called, that patient's 1 out of 2 bottles was growing gram-negative rods. Patient was called at home and was instructed to come back to hospital for IV antibiotics.  Patient denies any symptoms.  He was discharged on doxycycline. He denies nausea vomiting or diarrhea. Denies abdominal pain. Denies dysuria. No chest pain shortness of breath.    Review of systems:     All other systems reviewed and are negative.   With Past History of the following :    Past Medical History:  Diagnosis Date  . CAD S/P percutaneous coronary angioplasty 1998   PCI TO CX  . Diabetes mellitus without complication ([place])   . GERD (gastroesophageal reflux disease)   . Headache   . 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 Hypercholesteremia   . Hypertension   . Neuropathy   . Peripheral vascular disease ([place])    s/p R-L FEM-FEM BYPASS  . Tachycardia    after CABG, pt on medicine for this      Past Surgical History:  Procedure Laterality Date  . ABDOMINAL AORTOGRAM Bilateral 09/19/2016   Procedure: iliac;  Surgeon: Serafina Mitchell, MD;  Location: [place] [facility];  Service: Cardiovascular;  Laterality: Bilateral;  . CARDIAC CATHETERIZATION  2003   with stent  . CARDIAC CATHETERIZATION N/A 10/11/2015   Procedure: Left Heart Cath and Coronary Angiography;  Surgeon: Peter M Martinique, MD;  Location: [place] [facility];  Service: Cardiovascular;  Laterality: N/A;  . COLONOSCOPY N/A 09/22/2013   Procedure: COLONOSCOPY;  Surgeon: Daneil Dolin, MD;  [facility];  Service: Endoscopy;  Laterality: N/A;  9:30 AM  . CORONARY ARTERY BYPASS GRAFT N/A 10/18/2015   Procedure: CORONARY ARTERY BYPASS GRAFTING (CABG) x  five, using left internal mammary artery and right leg greater saphenous vein harvested endoscopically;  Surgeon: [name], MD;  Location: [place];  Service: Open Heart Surgery;  Laterality: N/A;  . ENDARTERECTOMY FEMORAL Right 08/09/2016   Procedure: ENDARTERECTOMY FEMORAL WITH VEIN PATCH ANGIOPLASTY;  Surgeon: Serafina Mitchell, MD;  Location: [place];  Service: Vascular;  Laterality: Right;  . FEMORAL-FEMORAL BYPASS GRAFT Bilateral 08/09/2016   Procedure: REVISION BYPASS GRAFT RIGHT FEMORAL-LEFT FEMORAL ARTERY;  Surgeon: Serafina Mitchell, MD;  Location: [place];  Service: Vascular;  Laterality: Bilateral;  . FEMORAL-POPLITEAL BYPASS GRAFT    . PERIPHERAL VASCULAR CATHETERIZATION N/A 05/08/2016  Procedure: Lower Extremity Angiography;  Surgeon: Lorretta Harp, MD;  Location: [place] [facility];  Service: Cardiovascular;  Laterality: N/A;  . PERIPHERAL VASCULAR INTERVENTION Right 09/19/2016   Procedure: Peripheral Vascular Intervention;  Surgeon: Serafina Mitchell, MD;  Location: [facility];  Service: Cardiovascular;  Laterality: Right;  ext iliac stent  . TEE WITHOUT CARDIOVERSION N/A 10/18/2015   Procedure: TRANSESOPHAGEAL ECHOCARDIOGRAM (TEE);  Surgeon: [name], MD;  Location: [place];  Service: Open Heart Surgery;  Laterality: N/A;      Social History:      Social History  Substance Use Topics  . Smoking status: Former Smoker    Packs/day: 2.00    Years: 30.00    Types: Cigarettes    Quit date: 08/14/2000  . Smokeless tobacco: Never Used  . Alcohol use No       Family History :     Family History  Problem Relation Age of Onset  . Heart attack  Mother   . Stroke Mother   . Colon cancer Neg Hx       Home Medications:   Prior to Admission medications   Medication Sig Start Date End Date Taking? Authorizing Provider  amLODipine (NORVASC) 10 MG tablet Take 10 mg by mouth daily.    [provider]  amoxicillin-clavulanate (AUGMENTIN) 875-125 MG tablet Take 1 tablet by mouth every 12 (twelve) hours. 06/02/17 06/07/17  Eber Jones, MD  aspirin 81 MG tablet Take 81 mg by mouth daily.    [provider]  Cholecalciferol (VITAMIN D) 2000 UNITS tablet Take 2,000 Units by mouth daily.    [provider]  cilostazol (PLETAL) 100 MG tablet Take 100 mg by mouth 2 (two) times daily.    [provider]  ferrous sulfate 325 (65 FE) MG tablet Take 325-650 mg by mouth 2 (two) times daily with a meal. 650mg  in the morning and 325mg  in the evening     [provider]  gabapentin (NEURONTIN) 300 MG capsule Take 300 mg by mouth 3 (three) times daily.     [provider]  insulin glargine (LANTUS) 100 UNIT/ML injection Give 10 units before bedtime each day. Patient taking differently: 20 Units at bedtime. Give 10 units before bedtime each day. 01/20/17 01/20/18  Orvan Falconer, MD  metoprolol tartrate (LOPRESSOR) 25 MG tablet Take 1 tablet (25 mg total) by mouth 2 (two) times daily. 01/16/17   Almyra Deforest, PA  pantoprazole (PROTONIX) 40 MG tablet Take 40 mg by mouth daily.    [provider]  simvastatin (ZOCOR) 40 MG tablet Take 20 mg by mouth at bedtime.     [provider]     Allergies:     Allergies  Allergen Reactions  . No Known Allergies      Physical Exam:   Vitals  Blood pressure (!) 119/58, pulse 66, temperature 98.3 F (36.8 C), temperature source Oral, resp. rate 16, SpO2 96 %.  1.  General: Appears in no acute distress  2. Psychiatric:  Intact judgement and  insight, awake alert, oriented x 3.  3. Neurologic: No focal neurological deficits, all cranial  nerves intact.Strength 5/5 all 4 extremities, sensation intact all 4 extremities, plantars down going.  4. Eyes :  anicteric sclerae, moist conjunctivae with no lid lag. PERRLA.  5. ENMT:  Oropharynx clear with moist mucous membranes and good dentition  6. Neck:  supple, no cervical lymphadenopathy appriciated, No thyromegaly  7. Respiratory : Normal respiratory  effort, good air movement bilaterally,clear to  auscultation bilaterally  8. Cardiovascular : RRR, no gallops, rubs or murmurs, no leg edema  9. Gastrointestinal:  Positive bowel sounds, abdomen soft, non-tender to palpation,no hepatosplenomegaly, no rigidity or guarding       10. Skin:  No cyanosis, normal texture and turgor, no rash, lesions or ulcers  11.Musculoskeletal:  Good muscle tone,  joints appear normal , no effusions,  normal range of motion    Data Review:    CBC  Recent Labs Lab 05/31/17 1553 05/31/17 1854 06/01/17 0525  WBC 6.9  --  10.1  HGB 13.8 15.0 12.0*  HCT 42.8 44.0 36.9*  PLT 315  --  254  MCV 87.2  --  87.9  MCH 28.1  --  28.6  MCHC 32.2  --  32.5  RDW 15.1  --  15.2  LYMPHSABS 0.2*  --   --   MONOABS 0.0*  --   --   EOSABS 0.1  --   --   BASOSABS 0.0  --   --    ------------------------------------------------------------------------------------------------------------------  Chemistries   Recent Labs Lab 05/31/17 1553 05/31/17 1854 06/01/17 0525  NA 135 136 136  K 3.8 4.0 3.7  CL 102 102 107  CO2 22  --  21*  GLUCOSE 151* 137* 78  BUN 22* 23* 18  CREATININE 1.54* 1.50* 1.22  CALCIUM 9.0  --  8.2*  AST 15  --  13*  ALT 11*  --  12*  ALKPHOS 74  --  53  BILITOT 0.6  --  0.6   ------------------------------------------------------------------------------------------------------------------  ------------------------------------------------------------------------------------------------------------------ GFR: Estimated Creatinine Clearance: 51.6 mL/min (by C-G  formula based on SCr of 1.22 mg/dL). Liver Function Tests:  Recent Labs Lab 05/31/17 1553 06/01/17 0525  AST 15 13*  ALT 11* 12*  ALKPHOS 74 53  BILITOT 0.6 0.6  PROT 6.9 5.4*  ALBUMIN 3.6 2.8*     Recent Labs Lab 05/31/17 1553  TROPONINI <0.03   BNP (last 3 results) No results for input(s): PROBNP in the last 8760 hours. HbA1C: No results for input(s): HGBA1C in the last 72 hours. CBG:  Recent Labs Lab 06/01/17 0851 06/01/17 1716 06/01/17 2148 06/02/17 0737 06/02/17 1133  GLUCAP 95 168* 217* 109* 232*    --------------------------------------------------------------------------------------------------------------- Urine analysis:    Component Value Date/Time   COLORURINE YELLOW 05/31/2017 [place] 05/31/2017 1544   LABSPEC 1.016 05/31/2017 1544   PHURINE 5.0 05/31/2017 1544   GLUCOSEU >=500 (A) 05/31/2017 1544   HGBUR NEGATIVE 05/31/2017 [place] 05/31/2017 1544   KETONESUR NEGATIVE 05/31/2017 1544   PROTEINUR NEGATIVE 05/31/2017 1544   UROBILINOGEN 0.2 03/16/2015 0120   NITRITE NEGATIVE 05/31/2017 1544   LEUKOCYTESUR NEGATIVE 05/31/2017 1544      Imaging Results:      Assessment & Plan:    Active Problems:   Bacteremia   1. Bacteremia-1 out of 2 bottles of blood cultures growing gram-negative rods.  We will start ceftriaxone and Flagyl.  Will await blood culture results and then discharged home on p.o. antibiotics based on culture results. 2. CAD-stable, continue aspirin, metoprolol 3. Diabetes mellitus-continue Lantus, sliding scale insulin with NovoLog    DVT Prophylaxis-   Lovenox   AM Labs Ordered, also please review Full Orders  Family Communication: Admission, patients condition and plan of care including tests being ordered have been discussed with the patient and his daughter at bedside who indicate understanding and agree with the plan  and Code Status.  Code Status: Full code  Admission  status: Observation  Time spent in minutes : 60 min   Willson Lipa S M.D on 06/05/2017 at 12:48 AM  Between 7am to 7pm - Pager - [phone]. After 7pm go to [email or web address] - password [facility]  [facility] - Office  [phone]

## 2017-06-07 LAB — CULTURE, BLOOD (ROUTINE X 2): SPECIAL REQUESTS: ADEQUATE

## 2017-07-23 ENCOUNTER — Encounter (HOSPITAL_COMMUNITY): Payer: Medicare Other

## 2017-07-23 ENCOUNTER — Ambulatory Visit: Payer: Medicare Other | Admitting: Surgery

## 2017-08-01 ENCOUNTER — Encounter (HOSPITAL_COMMUNITY): Payer: Self-pay | Admitting: *Deleted

## 2017-08-01 ENCOUNTER — Emergency Department (HOSPITAL_COMMUNITY): Payer: Medicare Other

## 2017-08-01 ENCOUNTER — Inpatient Hospital Stay (HOSPITAL_COMMUNITY)
Admission: EM | Admit: 2017-08-01 | Discharge: 2017-08-03 | DRG: 872 | Disposition: A | Discharge status: Home / Self Care | Payer: Medicare Other | Attending: Internal Medicine | Admitting: Internal Medicine

## 2017-08-01 ENCOUNTER — Other Ambulatory Visit: Payer: Self-pay

## 2017-08-01 DIAGNOSIS — K219 Gastro-esophageal reflux disease without esophagitis: Secondary | ICD-10-CM | POA: Diagnosis not present

## 2017-08-01 DIAGNOSIS — Z955 Presence of coronary angioplasty implant and graft: Secondary | ICD-10-CM | POA: Diagnosis not present

## 2017-08-01 DIAGNOSIS — K824 Cholesterolosis of gallbladder: Secondary | ICD-10-CM | POA: Diagnosis not present

## 2017-08-01 DIAGNOSIS — R81 Glycosuria: Secondary | ICD-10-CM | POA: Diagnosis present

## 2017-08-01 DIAGNOSIS — Z8249 Family history of ischemic heart disease and other diseases of the circulatory system: Secondary | ICD-10-CM

## 2017-08-01 DIAGNOSIS — R509 Fever, unspecified: Secondary | ICD-10-CM | POA: Diagnosis not present

## 2017-08-01 DIAGNOSIS — N179 Acute kidney failure, unspecified: Secondary | ICD-10-CM | POA: Diagnosis not present

## 2017-08-01 DIAGNOSIS — Z951 Presence of aortocoronary bypass graft: Secondary | ICD-10-CM

## 2017-08-01 DIAGNOSIS — N183 Chronic kidney disease, stage 3 unspecified: Secondary | ICD-10-CM

## 2017-08-01 DIAGNOSIS — E1151 Type 2 diabetes mellitus with diabetic peripheral angiopathy without gangrene: Secondary | ICD-10-CM | POA: Diagnosis present

## 2017-08-01 DIAGNOSIS — Z8701 Personal history of pneumonia (recurrent): Secondary | ICD-10-CM

## 2017-08-01 DIAGNOSIS — I119 Hypertensive heart disease without heart failure: Secondary | ICD-10-CM | POA: Diagnosis not present

## 2017-08-01 DIAGNOSIS — G629 Polyneuropathy, unspecified: Secondary | ICD-10-CM

## 2017-08-01 DIAGNOSIS — E1142 Type 2 diabetes mellitus with diabetic polyneuropathy: Secondary | ICD-10-CM | POA: Diagnosis present

## 2017-08-01 DIAGNOSIS — Z794 Long term (current) use of insulin: Secondary | ICD-10-CM

## 2017-08-01 DIAGNOSIS — Z87891 Personal history of nicotine dependence: Secondary | ICD-10-CM

## 2017-08-01 DIAGNOSIS — I739 Peripheral vascular disease, unspecified: Secondary | ICD-10-CM | POA: Diagnosis present

## 2017-08-01 DIAGNOSIS — E871 Hypo-osmolality and hyponatremia: Secondary | ICD-10-CM | POA: Diagnosis present

## 2017-08-01 DIAGNOSIS — E785 Hyperlipidemia, unspecified: Secondary | ICD-10-CM | POA: Diagnosis not present

## 2017-08-01 DIAGNOSIS — J9811 Atelectasis: Secondary | ICD-10-CM | POA: Diagnosis not present

## 2017-08-01 DIAGNOSIS — A419 Sepsis, unspecified organism: Secondary | ICD-10-CM | POA: Diagnosis not present

## 2017-08-01 DIAGNOSIS — I2510 Atherosclerotic heart disease of native coronary artery without angina pectoris: Secondary | ICD-10-CM | POA: Diagnosis present

## 2017-08-01 DIAGNOSIS — Z7982 Long term (current) use of aspirin: Secondary | ICD-10-CM

## 2017-08-01 DIAGNOSIS — I10 Essential (primary) hypertension: Secondary | ICD-10-CM | POA: Diagnosis present

## 2017-08-01 DIAGNOSIS — I951 Orthostatic hypotension: Secondary | ICD-10-CM | POA: Diagnosis present

## 2017-08-01 DIAGNOSIS — E1122 Type 2 diabetes mellitus with diabetic chronic kidney disease: Secondary | ICD-10-CM | POA: Diagnosis present

## 2017-08-01 DIAGNOSIS — R7881 Bacteremia: Secondary | ICD-10-CM

## 2017-08-01 DIAGNOSIS — I959 Hypotension, unspecified: Secondary | ICD-10-CM | POA: Diagnosis present

## 2017-08-01 DIAGNOSIS — E119 Type 2 diabetes mellitus without complications: Secondary | ICD-10-CM

## 2017-08-01 LAB — CBC WITH DIFFERENTIAL/PLATELET
Basophils Absolute: 0 10*3/uL (ref 0.0–0.1)
Basophils Relative: 0 %
EOS ABS: 0 10*3/uL (ref 0.0–0.7)
EOS PCT: 0 %
HCT: 43.9 % (ref 39.0–52.0)
Hemoglobin: 14.4 g/dL (ref 13.0–17.0)
LYMPHS ABS: 0.3 10*3/uL — AB (ref 0.7–4.0)
LYMPHS PCT: 2 %
MCH: 28.7 pg (ref 26.0–34.0)
MCHC: 32.8 g/dL (ref 30.0–36.0)
MCV: 87.5 fL (ref 78.0–100.0)
MONO ABS: 0.1 10*3/uL (ref 0.1–1.0)
Monocytes Relative: 0 %
Neutro Abs: 11.1 10*3/uL — ABNORMAL HIGH (ref 1.7–7.7)
Neutrophils Relative %: 98 %
PLATELETS: 355 10*3/uL (ref 150–400)
RBC: 5.02 MIL/uL (ref 4.22–5.81)
RDW: 15 % (ref 11.5–15.5)
WBC: 11.5 10*3/uL — ABNORMAL HIGH (ref 4.0–10.5)

## 2017-08-01 LAB — URINALYSIS, ROUTINE W REFLEX MICROSCOPIC
Bacteria, UA: NONE SEEN
Bilirubin Urine: NEGATIVE
HGB URINE DIPSTICK: NEGATIVE
Ketones, ur: NEGATIVE mg/dL
Leukocytes, UA: NEGATIVE
Nitrite: NEGATIVE
PH: 5 (ref 5.0–8.0)
Protein, ur: NEGATIVE mg/dL
SPECIFIC GRAVITY, URINE: 1.02 (ref 1.005–1.030)
Squamous Epithelial / LPF: NONE SEEN

## 2017-08-01 LAB — I-STAT CG4 LACTIC ACID, ED
LACTIC ACID, VENOUS: 1.72 mmol/L (ref 0.5–1.9)
LACTIC ACID, VENOUS: 1.77 mmol/L (ref 0.5–1.9)

## 2017-08-01 LAB — MAGNESIUM: Magnesium: 2 mg/dL (ref 1.7–2.4)

## 2017-08-01 LAB — PROTIME-INR
INR: 0.97
Prothrombin Time: 12.8 seconds (ref 11.4–15.2)

## 2017-08-01 LAB — COMPREHENSIVE METABOLIC PANEL
ALK PHOS: 80 U/L (ref 38–126)
ALT: 12 U/L — AB (ref 17–63)
AST: 14 U/L — AB (ref 15–41)
Albumin: 3.8 g/dL (ref 3.5–5.0)
Anion gap: 14 (ref 5–15)
BUN: 34 mg/dL — AB (ref 6–20)
CALCIUM: 9.3 mg/dL (ref 8.9–10.3)
CHLORIDE: 98 mmol/L — AB (ref 101–111)
CO2: 19 mmol/L — AB (ref 22–32)
CREATININE: 1.86 mg/dL — AB (ref 0.61–1.24)
GFR calc Af Amer: 41 mL/min — ABNORMAL LOW (ref >60)
GFR, EST NON AFRICAN AMERICAN: 35 mL/min — AB (ref >60)
Glucose, Bld: 255 mg/dL — ABNORMAL HIGH (ref 65–99)
Potassium: 4.4 mmol/L (ref 3.5–5.1)
Sodium: 131 mmol/L — ABNORMAL LOW (ref 135–145)
Total Bilirubin: 0.8 mg/dL (ref 0.3–1.2)
Total Protein: 7.3 g/dL (ref 6.5–8.1)

## 2017-08-01 LAB — INFLUENZA PANEL BY PCR (TYPE A & B)
Influenza A By PCR: NEGATIVE
Influenza B By PCR: NEGATIVE

## 2017-08-01 MED ORDER — DEXTROSE 5 % IV SOLN
1.0000 g | Freq: Once | INTRAVENOUS | Status: AC
  Administered 2017-08-01: 1 g via INTRAVENOUS
  Filled 2017-08-01: qty 10

## 2017-08-01 MED ORDER — ENOXAPARIN SODIUM 40 MG/0.4ML ~~LOC~~ SOLN
40.0000 mg | SUBCUTANEOUS | Status: DC
  Administered 2017-08-02 (×2): 40 mg via SUBCUTANEOUS
  Filled 2017-08-01 (×2): qty 0.4

## 2017-08-01 MED ORDER — SODIUM CHLORIDE 0.9 % IV SOLN
INTRAVENOUS | Status: AC
  Administered 2017-08-01: via INTRAVENOUS

## 2017-08-01 MED ORDER — SODIUM CHLORIDE 0.9 % IV BOLUS (SEPSIS)
1000.0000 mL | Freq: Once | INTRAVENOUS | Status: AC
  Administered 2017-08-01: 1000 mL via INTRAVENOUS

## 2017-08-01 MED ORDER — INSULIN ASPART 100 UNIT/ML ~~LOC~~ SOLN
0.0000 [IU] | Freq: Three times a day (TID) | SUBCUTANEOUS | Status: DC
  Administered 2017-08-02: 3 [IU] via SUBCUTANEOUS
  Administered 2017-08-02 – 2017-08-03 (×3): 2 [IU] via SUBCUTANEOUS
  Administered 2017-08-03: 7 [IU] via SUBCUTANEOUS

## 2017-08-01 MED ORDER — CILOSTAZOL 100 MG PO TABS
100.0000 mg | ORAL_TABLET | Freq: Two times a day (BID) | ORAL | Status: DC
  Administered 2017-08-02 – 2017-08-03 (×4): 100 mg via ORAL
  Filled 2017-08-01 (×11): qty 1

## 2017-08-01 MED ORDER — SIMVASTATIN 20 MG PO TABS
20.0000 mg | ORAL_TABLET | Freq: Every day | ORAL | Status: DC
  Administered 2017-08-02 (×2): 20 mg via ORAL
  Filled 2017-08-01 (×2): qty 1

## 2017-08-01 MED ORDER — GABAPENTIN 300 MG PO CAPS: 300.0000 mg | ORAL_CAPSULE | Freq: Two times a day (BID) | ORAL | Status: DC

## 2017-08-01 MED ORDER — LEVOFLOXACIN 500 MG PO TABS
500.0000 mg | ORAL_TABLET | Freq: Once | ORAL | Status: AC
  Administered 2017-08-01: 500 mg via ORAL
  Filled 2017-08-01: qty 1

## 2017-08-01 MED ORDER — GABAPENTIN 300 MG PO CAPS
300.0000 mg | ORAL_CAPSULE | Freq: Every day | ORAL | Status: DC
  Administered 2017-08-02 – 2017-08-03 (×2): 300 mg via ORAL
  Filled 2017-08-01 (×2): qty 1

## 2017-08-01 MED ORDER — ASPIRIN EC 81 MG PO TBEC
81.0000 mg | DELAYED_RELEASE_TABLET | Freq: Every day | ORAL | Status: DC
  Administered 2017-08-02 – 2017-08-03 (×2): 81 mg via ORAL
  Filled 2017-08-01 (×2): qty 1

## 2017-08-01 MED ORDER — INSULIN GLARGINE 100 UNIT/ML ~~LOC~~ SOLN
20.0000 [IU] | Freq: Every day | SUBCUTANEOUS | Status: DC
  Administered 2017-08-02 (×2): 20 [IU] via SUBCUTANEOUS
  Filled 2017-08-01 (×5): qty 0.2

## 2017-08-01 MED ORDER — VITAMIN D 1000 UNITS PO TABS
2000.0000 [IU] | ORAL_TABLET | Freq: Every day | ORAL | Status: DC
  Administered 2017-08-02 – 2017-08-03 (×2): 2000 [IU] via ORAL
  Filled 2017-08-01 (×3): qty 2

## 2017-08-01 MED ORDER — GABAPENTIN 300 MG PO CAPS
600.0000 mg | ORAL_CAPSULE | Freq: Every day | ORAL | Status: DC
  Administered 2017-08-02 (×2): 600 mg via ORAL
  Filled 2017-08-01 (×2): qty 2

## 2017-08-01 MED ORDER — VANCOMYCIN HCL IN DEXTROSE 1-5 GM/200ML-% IV SOLN
1000.0000 mg | Freq: Once | INTRAVENOUS | Status: AC
  Administered 2017-08-02: 1000 mg via INTRAVENOUS
  Filled 2017-08-01: qty 200

## 2017-08-01 MED ORDER — PANTOPRAZOLE SODIUM 40 MG PO TBEC
80.0000 mg | DELAYED_RELEASE_TABLET | Freq: Two times a day (BID) | ORAL | Status: DC
  Administered 2017-08-02 – 2017-08-03 (×4): 80 mg via ORAL
  Filled 2017-08-01 (×4): qty 2

## 2017-08-01 MED ORDER — FERROUS SULFATE 325 (65 FE) MG PO TABS
325.0000 mg | ORAL_TABLET | Freq: Every day | ORAL | Status: DC
  Administered 2017-08-02 – 2017-08-03 (×2): 325 mg via ORAL
  Filled 2017-08-01 (×2): qty 1

## 2017-08-01 MED ORDER — PIPERACILLIN-TAZOBACTAM 3.375 G IVPB 30 MIN
3.3750 g | Freq: Once | INTRAVENOUS | Status: AC
  Administered 2017-08-02: 3.375 g via INTRAVENOUS
  Filled 2017-08-01 (×2): qty 50

## 2017-08-01 NOTE — ED Triage Notes (Signed)
Pt brought in by [facility] for c/o fever; pt was found by family to be a little lethargic; cbg 279; ems reports high temp of 104; pt c/o generalized weakness all over; pt given 1000mg  tylenol by family;

## 2017-08-01 NOTE — ED Notes (Signed)
Have given pt. A urinal to pee in

## 2017-08-01 NOTE — ED Notes (Signed)
Will notify pt of urine sample need upon return

## 2017-08-01 NOTE — H&P (Signed)
History and Physical    [name] OIZ:[id number] DOB: July 24, 1946 DOA: 08/01/2017  PCP: [name], MD   Patient coming from: Home.  I have personally briefly reviewed patient's old medical records in [place]  Chief Complaint: Fever.  HPI: [name] is a 71 y.o. male with medical history significant of quintuple CAD/CABG, type 2 diabetes, GERD, headaches, hyperlipidemia, hypertension, diabetic peripheral neuropathy, peripheral vascular disease, unspecified tachycardia, multiple episodes of pneumonia who is brought to the emergency department due to fever.  Per patient, he woke up in his usual state of health this morning.  He had cereal for breakfast.  After this, he went to the cemetery to visit a grave and place some flowers on it.  When he returned home around 1100, he felt weak, stayed on his truck and coincidentally at that moment his wife [name], who was shopping called him.  She noticed he was not himself and not feeling well.  He told her that he was unable to get out of his truck.  He was having a lot of rigors.  His wife return, he subsequently went home and later in the evening developed fever.  His wife stated that these symptoms are similar to that once he gets when he is having a pneumonia.  Back in October, he was admitted for E. coli bacteremia, but no other specific source was found.  He denies headache, earaches, sore throat, rhinorrhea, productive cough, wheezing, chest pain, palpitations, dizziness, diaphoresis, PND, orthopnea or pitting edema of the lower extremities.  No abdominal pain, nausea, emesis, diarrhea, melena or hematochezia.  Denies dysuria, frequency or hematuria.  No polyuria, polydipsia, polyphagia or blurred vision.  He denies pruritus or skin rashes.  ED Course: Initial vital signs temperature 37.2, then recheck about 20 minutes later 38.1 C, pulse 101, respirations 15 and blood pressure 130/59.  After the patient became febrile, he was  followed by persistent systolic hypotension in the 90s and low 100s, despite IV fluids.  He was given 2000 mL of NS bolus.  Also Levaquin 500 mg p.o. x1 dose and Rocephin 1 g IVPB.  His workup shows an urinalysis with glucosuria over 500 mg/dL, but otherwise normal.  WBC of 11.5 with 98% neutrophils, hemoglobin 14.4 g/dL and platelets 355.  PT and INR were normal.  His lactic acid was normal.  Sodium was 131, potassium 4.4, chloride 98 and bicarbonate 19 mmol/L.  Glucose 255, BUN 34 and creatinine 1.86 mg/dL.  His transaminases were low, but LFTs otherwise unremarkable.  His EKG showed sinus tachycardia at 100 bpm with borderline intraventricular conduction delay.  Chest radiograph showed cardiomegaly, mild bronchitic changes with bibasilar atelectasis, but no pleural effusion or focal consolidation.  Review of Systems: As per HPI otherwise 10 point review of systems negative.    Past Medical History:  Diagnosis Date  . CAD S/P percutaneous coronary angioplasty 1998   PCI TO CX  . Diabetes mellitus without complication ([place])   . GERD (gastroesophageal reflux disease)   . Headache   . 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 Hypercholesteremia   . Hypertension   . Neuropathy   . Peripheral vascular disease ([place])    s/p R-L FEM-FEM BYPASS  . Tachycardia    after CABG, pt on medicine for this    Past Surgical History:  Procedure Laterality Date  . ABDOMINAL AORTOGRAM Bilateral 09/19/2016   Procedure: iliac;  Surgeon: Serafina Mitchell, MD;  Location: [place]  [facility];  Service: Cardiovascular;  Laterality: Bilateral;  . CARDIAC CATHETERIZATION  2003   with stent  . CARDIAC CATHETERIZATION N/A 10/11/2015   Procedure: Left Heart Cath and Coronary Angiography;  Surgeon: Peter M Martinique, MD;  Location: [place] [facility];  Service: Cardiovascular;  Laterality: N/A;  . COLONOSCOPY N/A 09/22/2013   Procedure: COLONOSCOPY;  Surgeon: Daneil Dolin, MD;  [facility];  Service: Endoscopy;   Laterality: N/A;  9:30 AM  . CORONARY ARTERY BYPASS GRAFT N/A 10/18/2015   Procedure: CORONARY ARTERY BYPASS GRAFTING (CABG) x  five, using left internal mammary artery and right leg greater saphenous vein harvested endoscopically;  Surgeon: [name], MD;  Location: [place];  Service: Open Heart Surgery;  Laterality: N/A;  . ENDARTERECTOMY FEMORAL Right 08/09/2016   Procedure: ENDARTERECTOMY FEMORAL WITH VEIN PATCH ANGIOPLASTY;  Surgeon: Serafina Mitchell, MD;  Location: [place];  Service: Vascular;  Laterality: Right;  . FEMORAL-FEMORAL BYPASS GRAFT Bilateral 08/09/2016   Procedure: REVISION BYPASS GRAFT RIGHT FEMORAL-LEFT FEMORAL ARTERY;  Surgeon: Serafina Mitchell, MD;  Location: [place];  Service: Vascular;  Laterality: Bilateral;  . FEMORAL-POPLITEAL BYPASS GRAFT    . PERIPHERAL VASCULAR CATHETERIZATION N/A 05/08/2016   Procedure: Lower Extremity Angiography;  Surgeon: Lorretta Harp, MD;  Location: [place] [facility];  Service: Cardiovascular;  Laterality: N/A;  . PERIPHERAL VASCULAR INTERVENTION Right 09/19/2016   Procedure: Peripheral Vascular Intervention;  Surgeon: Serafina Mitchell, MD;  Location: [place] [facility];  Service: Cardiovascular;  Laterality: Right;  ext iliac stent  . TEE WITHOUT CARDIOVERSION N/A 10/18/2015   Procedure: TRANSESOPHAGEAL ECHOCARDIOGRAM (TEE);  Surgeon: [name], MD;  Location: [place];  Service: Open Heart Surgery;  Laterality: N/A;     reports that he quit smoking about 16 years ago. His smoking use included cigarettes. He has a 60.00 pack-year smoking history. he has never used smokeless tobacco. He reports that he does not drink alcohol or use drugs.  Allergies  Allergen Reactions  . No Known Allergies     Family History  Problem Relation Age of Onset  . Heart attack Mother   . Stroke Mother   . Colon cancer Neg Hx     Prior to Admission medications   Medication Sig Start Date End Date Taking? Authorizing Provider  amLODipine (NORVASC)  10 MG tablet Take 10 mg by mouth daily.   Yes [provider]  aspirin 81 MG tablet Take 81 mg by mouth daily.   Yes [provider]  Cholecalciferol (VITAMIN D) 2000 UNITS tablet Take 2,000 Units by mouth daily.   Yes [provider]  cilostazol (PLETAL) 100 MG tablet Take 100 mg by mouth 2 (two) times daily.   Yes [provider]  ferrous sulfate 325 (65 FE) MG tablet Take 325 mg by mouth daily.    Yes [provider]  gabapentin (NEURONTIN) 300 MG capsule Take 300-600 mg by mouth 2 (two) times daily. 300mg  in the morning and 600mg  at bedtime   Yes [provider]  insulin glargine (LANTUS) 100 UNIT/ML injection Give 10 units before bedtime each day. Patient taking differently: 20 Units at bedtime. Give 10 units before bedtime each day. 01/20/17 01/20/18 Yes Orvan Falconer, MD  metoprolol tartrate (LOPRESSOR) 25 MG tablet Take 1 tablet (25 mg total) by mouth 2 (two) times daily. 01/16/17  Yes Almyra Deforest, PA  pantoprazole (PROTONIX) 40 MG tablet Take 80 mg by mouth 2 (two) times daily.  Yes [provider]  simvastatin (ZOCOR) 40 MG tablet Take 20 mg by mouth at bedtime.    Yes [provider]    Physical Exam: Vitals:   08/01/17 2000 08/01/17 2030 08/01/17 2130 08/01/17 2200  BP: 104/62 (!) 94/58 (!) 90/53 (!) 106/55  Pulse: (!) 101 93 81 78  Resp: 16 13 17 17   Temp:      TempSrc:      SpO2: 96% 91% 95% 97%  Weight:      Height:        Constitutional: Mildly febrile, but otherwise in NAD, calm, comfortable Eyes: PERRL, lids and conjunctivae normal ENMT: Mucous membranes are moist, but lips are dry.. Posterior pharynx clear of any exudate or lesions. dentures. Neck: normal, supple, no adenopathies or masses, no thyromegaly Respiratory: clear to auscultation bilaterally, no wheezing, no crackles. Normal respiratory effort. No accessory muscle use.  Cardiovascular: Regular rate and rhythm, no murmurs / rubs / gallops. No  extremity edema. 2+ pedal pulses. No carotid bruits.  Abdomen: Nondistended, bowel sounds positive. soft, no tenderness, no masses palpated. No hepatosplenomegaly.  Musculoskeletal: no clubbing / cyanosis. Good ROM, no contractures. Normal muscle tone.  Skin: Small ecchymosis area on forearms, particularly on left side. Neurologic: CN 2-12 grossly intact. Sensation intact, DTR normal. Strength 5/5 in all 4.  Psychiatric: Alert and oriented x 4. Normal mood.    Labs on Admission: I have personally reviewed following labs and imaging studies  CBC: Recent Labs  Lab 08/01/17 1633  WBC 11.5*  NEUTROABS 11.1*  HGB 14.4  HCT 43.9  MCV 87.5  PLT 010   Basic Metabolic Panel: Recent Labs  Lab 08/01/17 1633  NA 131*  K 4.4  CL 98*  CO2 19*  GLUCOSE 255*  BUN 34*  CREATININE 1.86*  CALCIUM 9.3   GFR: Estimated Creatinine Clearance: 33.8 mL/min (A) (by C-G formula based on SCr of 1.86 mg/dL (H)). Liver Function Tests: Recent Labs  Lab 08/01/17 1633  AST 14*  ALT 12*  ALKPHOS 80  BILITOT 0.8  PROT 7.3  ALBUMIN 3.8   No results for input(s): LIPASE, AMYLASE in the last 168 hours. No results for input(s): AMMONIA in the last 168 hours. Coagulation Profile: Recent Labs  Lab 08/01/17 1633  INR 0.97   Cardiac Enzymes: No results for input(s): CKTOTAL, CKMB, CKMBINDEX, TROPONINI in the last 168 hours. BNP (last 3 results) No results for input(s): PROBNP in the last 8760 hours. HbA1C: No results for input(s): HGBA1C in the last 72 hours. CBG: No results for input(s): GLUCAP in the last 168 hours. Lipid Profile: No results for input(s): CHOL, HDL, LDLCALC, TRIG, CHOLHDL, LDLDIRECT in the last 72 hours. Thyroid Function Tests: No results for input(s): TSH, T4TOTAL, FREET4, T3FREE, THYROIDAB in the last 72 hours. Anemia Panel: No results for input(s): VITAMINB12, FOLATE, FERRITIN, TIBC, IRON, RETICCTPCT in the last 72 hours. Urine analysis:    Component Value Date/Time     COLORURINE YELLOW 08/01/2017 1621   APPEARANCEUR CLEAR 08/01/2017 1621   LABSPEC 1.020 08/01/2017 1621   PHURINE 5.0 08/01/2017 1621   GLUCOSEU >=500 (A) 08/01/2017 1621   HGBUR NEGATIVE 08/01/2017 1621   BILIRUBINUR NEGATIVE 08/01/2017 1621   KETONESUR NEGATIVE 08/01/2017 1621   PROTEINUR NEGATIVE 08/01/2017 1621   UROBILINOGEN 0.2 03/16/2015 0120   NITRITE NEGATIVE 08/01/2017 1621   LEUKOCYTESUR NEGATIVE 08/01/2017 1621    Radiological Exams on Admission: Dg Chest 2 View  Result Date: 08/01/2017 CLINICAL DATA:  Lethargy, fever and  weakness today. History of hypertension, CABG. EXAM: CHEST  2 VIEW COMPARISON:  Chest radiograph May 31, 2017 FINDINGS: Cardiac silhouette is at least moderately enlarged. Calcified aortic knob. Mild bronchitic changes. Status post median sternotomy for CABG. Mildly elevated RIGHT hemidiaphragm with bibasilar strandy densities. No pleural effusion or focal consolidation. No pneumothorax. IMPRESSION: Stable cardiomegaly. Mild bronchitic changes. Bibasilar atelectasis. Electronically Signed   By: Elon Alas M.D.   On: 08/01/2017 17:39   10/18/2015 echo TEE ------------------------------------------------------------------- LV EF: 50% -   55%  ------------------------------------------------------------------- Indications:      CAD of native vessels 414.01.  ------------------------------------------------------------------- History:   Risk factors:  Hypertension. Diabetes mellitus. Dyslipidemia.  ------------------------------------------------------------------- Study Conclusions  - Left ventricle: The cavity size was normal. Wall thickness was   normal. Systolic function was normal. The estimated ejection   fraction was in the range of 50% to 55%. There was no dynamic   obstruction. Hypokinesis of the anterior and anterolateral   myocardium; consistent with ischemia. - Aortic valve: No evidence of vegetation. - Mitral valve: No  evidence of vegetation. - Atrial septum: No defect or patent foramen ovale was identified.  Impressions:  - Ejection Fraction of 50%. Mild hypokinesis of the anterolateral   and anterior walls. Trivial mitral and tricuspid regurgitation.   EKG: Independently reviewed. Vent. rate 100 BPM PR interval * ms QRS duration 113 ms QT/QTc 324/418 ms P-R-T axes 44 21 27 Sinus tachycardia Borderline intraventricular conduction delay  Assessment/Plan Principal Problem:   Sepsis due to undetermined organism [facility]) Unknown source at this time. The patient was recently admitted for E. coli bacteremia. Admit to stepdown/inpatient. Supplemental oxygen as needed. Continue IV fluids. Vancomycin per pharmacy. Zosyn per pharmacy. Follow-up blood cultures and sensitivity.  Active Problems:   Hypotension Improved with IV fluids. Continue time limited IV fluids. Continue IV antibiotics. Monitor blood pressure.    Essential hypertension Hold amlodipine and metoprolol at this time. Monitor blood pressure.    Neuropathy, peripheral Continue gabapentin 300 mg p.o. in the morning. Continue gabapentin 600 mg p.o. at bedtime.    HLD (hyperlipidemia) Continue simvastatin 40 mg p.o. daily. Monitor hepatic function panel as needed.. Fasting lipid profile follow-up as an outpatient.    Type 2 diabetes mellitus (HCC) Last hemoglobin A1c was 9.8% on May 31, 2017 Carbohydrate modified diet. Continue Lantus 10 units SQ at bedtime. CBG monitoring with regular insulin sliding scale while in the hospital.    Hyponatremia Received NS bolus in the ED. Continue time limited normal saline infusion Follow-up sodium level.    PAD (peripheral artery disease) (HCC) Continue Pletal 100 mg p.o. twice daily.    GERD (gastroesophageal reflux disease) Continue Protonix 80 mg p.o. twice daily.     DVT prophylaxis: Lovenox SQ. Code Status: Full code. Family Communication: His wife [name] was in  the ED room. Disposition Plan: Admit for close monitoring, IV fluids and IV antibiotic therapy. Consults called:  Admission status: Inpatient/stepdown.   Reubin Milan MD [facility] Pager [phone].  If 7PM-7AM, please contact night-coverage [email or web address] Password [email or web address]  08/01/2017, 10:11 PM

## 2017-08-01 NOTE — ED Notes (Signed)
Admitting MD at bedside.

## 2017-08-01 NOTE — ED Notes (Signed)
Pt to Chest XRAY

## 2017-08-01 NOTE — ED Provider Notes (Addendum)
[facility] EMERGENCY DEPARTMENT Provider Note   CSN: [id number] Arrival date & time: 08/01/17  1620     History   Chief Complaint Chief Complaint  Patient presents with  . Fever    HPI [name] is a 71 y.o. male.  Patient presents with a fever.  Patient is also had chills.   The history is provided by the patient.  Fever   This is a new problem. The current episode started more than 2 days ago. The problem occurs constantly. The problem has not changed since onset.The maximum temperature noted was 100 to 100.9 F. The temperature was taken using an oral thermometer. Pertinent negatives include no chest pain, no diarrhea, no congestion, no headaches and no cough. He has tried nothing for the symptoms. The treatment provided no relief.    Past Medical History:  Diagnosis Date  . CAD S/P percutaneous coronary angioplasty 1998   PCI TO CX  . Diabetes mellitus without complication ([place])   . GERD (gastroesophageal reflux disease)   . Headache   . 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 Hypercholesteremia   . Hypertension   . Neuropathy   . Peripheral vascular disease ([place])    s/p R-L FEM-FEM BYPASS  . Tachycardia    after CABG, pt on medicine for this    Patient Active Problem List   Diagnosis Date Noted  . Sepsis due to undetermined organism ([place]) 08/01/2017  . GERD (gastroesophageal reflux disease) 08/01/2017  . Bacteremia 06/05/2017  . SIRS (systemic inflammatory response syndrome) ([place]) 05/31/2017  . PAD (peripheral artery disease) ([place]) 08/09/2016  . Claudication ([place]) 05/08/2016  . HCAP (healthcare-associated pneumonia) 01/12/2016  . Hyponatremia 01/12/2016  . Abnormal chest CT 10/13/2015  . Type 2 diabetes, HbA1c goal < 7% (HCC)   . Unstable angina ([place])   . Hypomagnesemia   . Healthcare-associated pneumonia 10/09/2015  . Type 2 diabetes mellitus ([place]) 10/09/2015  . ASCVD (arteriosclerotic cardiovascular disease) 10/09/2015  . CKD (chronic kidney disease)  stage 2, GFR 60-89 ml/min 10/09/2015  . CAP (community acquired pneumonia) 07/09/2015  . Sepsis ([place]) 07/09/2015  . 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 PVD (peripheral vascular disease) ([place]) 07/09/2015  . Essential hypertension 07/09/2015  . Hypotension 07/09/2015  . Neuropathy, peripheral 07/09/2015  . HLD (hyperlipidemia) 07/09/2015  . AKI (acute kidney injury) ([place]) 07/09/2015    Past Surgical History:  Procedure Laterality Date  . ABDOMINAL AORTOGRAM Bilateral 09/19/2016   Procedure: iliac;  Surgeon: Serafina Mitchell, MD;  Location: [place] [facility];  Service: Cardiovascular;  Laterality: Bilateral;  . CARDIAC CATHETERIZATION  2003   with stent  . CARDIAC CATHETERIZATION N/A 10/11/2015   Procedure: Left Heart Cath and Coronary Angiography;  Surgeon: Peter M Martinique, MD;  Location: [place] [facility];  Service: Cardiovascular;  Laterality: N/A;  . COLONOSCOPY N/A 09/22/2013   Procedure: COLONOSCOPY;  Surgeon: Daneil Dolin, MD;  [facility];  Service: Endoscopy;  Laterality: N/A;  9:30 AM  . CORONARY ARTERY BYPASS GRAFT N/A 10/18/2015   Procedure: CORONARY ARTERY BYPASS GRAFTING (CABG) x  five, using left internal mammary artery and right leg greater saphenous vein harvested endoscopically;  Surgeon: [name], MD;  Location: [place];  Service: Open Heart Surgery;  Laterality: N/A;  . ENDARTERECTOMY FEMORAL Right 08/09/2016   Procedure: ENDARTERECTOMY FEMORAL WITH VEIN PATCH ANGIOPLASTY;  Surgeon: Serafina Mitchell, MD;  Location: [place];  Service: Vascular;  Laterality: Right;  . FEMORAL-FEMORAL BYPASS GRAFT Bilateral 08/09/2016   Procedure: REVISION BYPASS GRAFT RIGHT FEMORAL-LEFT FEMORAL ARTERY;  Surgeon: Serafina Mitchell, MD;  Location: [place];  Service: Vascular;  Laterality: Bilateral;  . FEMORAL-POPLITEAL BYPASS GRAFT    . PERIPHERAL VASCULAR CATHETERIZATION N/A 05/08/2016   Procedure: Lower Extremity Angiography;  Surgeon: Lorretta Harp, MD;  Location: [place] [facility];  Service: Cardiovascular;  Laterality: N/A;  . PERIPHERAL VASCULAR INTERVENTION Right 09/19/2016   Procedure: Peripheral Vascular Intervention;  Surgeon: Serafina Mitchell, MD;  Location: [place] [facility];  Service: Cardiovascular;  Laterality: Right;  ext iliac stent  . TEE WITHOUT CARDIOVERSION N/A 10/18/2015   Procedure: TRANSESOPHAGEAL ECHOCARDIOGRAM (TEE);  Surgeon: [name], MD;  Location: [place];  Service: Open Heart Surgery;  Laterality: N/A;       Home Medications    Prior to Admission medications   Medication Sig Start Date End Date Taking? Authorizing Provider  amLODipine (NORVASC) 10 MG tablet Take 10 mg by mouth daily.   Yes [provider]  aspirin 81 MG tablet Take 81 mg by mouth daily.   Yes [provider]  Cholecalciferol (VITAMIN D) 2000 UNITS tablet Take 2,000 Units by mouth daily.   Yes [provider]  cilostazol (PLETAL) 100 MG tablet Take 100 mg by mouth 2 (two) times daily.   Yes [provider]  ferrous sulfate 325 (65 FE) MG tablet Take 325 mg by mouth daily.    Yes [provider]  gabapentin (NEURONTIN) 300 MG capsule Take 300-600 mg by mouth 2 (two) times daily. 300mg  in the morning and 600mg  at bedtime   Yes [provider]  insulin glargine (LANTUS) 100 UNIT/ML injection Give 10 units before bedtime each day. Patient taking differently: 20 Units at bedtime. Give 10 units before bedtime each day. 01/20/17 01/20/18 Yes Orvan Falconer, MD  metoprolol tartrate (LOPRESSOR) 25 MG tablet Take 1 tablet (25 mg total) by mouth 2 (two) times daily. 01/16/17  Yes Almyra Deforest, PA  pantoprazole (PROTONIX) 40 MG tablet Take 80 mg by mouth 2 (two) times daily.    Yes [provider]  simvastatin (ZOCOR) 40 MG tablet Take 20 mg by mouth at bedtime.    Yes [provider]    Family History Family History  Problem Relation Age of Onset  . Heart attack Mother   . Stroke Mother   . Colon  cancer Neg Hx     Social History Social History   Tobacco Use  . Smoking status: Former Smoker    Packs/day: 2.00    Years: 30.00    Pack years: 60.00    Types: Cigarettes    Last attempt to quit: 08/14/2000    Years since quitting: 16.9  . Smokeless tobacco: Never Used  Substance Use Topics  . Alcohol use: No  . Drug use: No     Allergies   No known allergies   Review of Systems Review of Systems  Constitutional: Positive for fever. Negative for appetite change and fatigue.  HENT: Negative for congestion, ear discharge and sinus pressure.   Eyes: Negative for discharge.  Respiratory: Negative for cough.   Cardiovascular: Negative for chest pain.  Gastrointestinal: Negative for abdominal pain and diarrhea.  Genitourinary: Negative for frequency and hematuria.  Musculoskeletal: Negative for back pain.  Skin: Negative for rash.  Neurological: Negative for seizures and headaches.  Psychiatric/Behavioral: Negative for hallucinations.     Physical Exam Updated Vital  Signs BP (!) 90/53   Pulse 81   Temp (!) 100.6 F (38.1 C) (Oral)   Resp 17   Ht 5\' 6"  (1.676 m)   Wt 74.8 kg (165 lb)   SpO2 95%   BMI 26.63 kg/m   Physical Exam  Constitutional: He is oriented to person, place, and time. He appears well-developed.  HENT:  Head: Normocephalic.  Eyes: Conjunctivae and EOM are normal. No scleral icterus.  Neck: Neck supple. No thyromegaly present.  Cardiovascular: Normal rate and regular rhythm. Exam reveals no gallop and no friction rub.  No murmur heard. Pulmonary/Chest: No stridor. He has no wheezes. He has no rales. He exhibits no tenderness.  Abdominal: He exhibits no distension. There is no tenderness. There is no rebound.  Musculoskeletal: Normal range of motion. He exhibits no edema.  Lymphadenopathy:    He has no cervical adenopathy.  Neurological: He is oriented to person, place, and time. He exhibits normal muscle tone. Coordination normal.  Skin: No  rash noted. No erythema.  Psychiatric: He has a normal mood and affect. His behavior is normal.     ED Treatments / Results  Labs (all labs ordered are listed, but only abnormal results are displayed) Labs Reviewed  COMPREHENSIVE METABOLIC PANEL - Abnormal; Notable for the following components:      Result Value   Sodium 131 (*)    Chloride 98 (*)    CO2 19 (*)    Glucose, Bld 255 (*)    BUN 34 (*)    Creatinine, Ser 1.86 (*)    AST 14 (*)    ALT 12 (*)    GFR calc non Af Amer 35 (*)    GFR calc Af Amer 41 (*)    All other components within normal limits  CBC WITH DIFFERENTIAL/PLATELET - Abnormal; Notable for the following components:   WBC 11.5 (*)    Neutro Abs 11.1 (*)    Lymphs Abs 0.3 (*)    All other components within normal limits  URINALYSIS, ROUTINE W REFLEX MICROSCOPIC - Abnormal; Notable for the following components:   Glucose, UA >=500 (*)    All other components within normal limits  CULTURE, BLOOD (ROUTINE X 2)  CULTURE, BLOOD (ROUTINE X 2)  URINE CULTURE  PROTIME-INR  INFLUENZA PANEL BY PCR (TYPE A & B)  I-STAT CG4 LACTIC ACID, ED  I-STAT CG4 LACTIC ACID, ED    EKG  EKG Interpretation None       Radiology Dg Chest 2 View  Result Date: 08/01/2017 CLINICAL DATA:  Lethargy, fever and weakness today. History of hypertension, CABG. EXAM: CHEST  2 VIEW COMPARISON:  Chest radiograph May 31, 2017 FINDINGS: Cardiac silhouette is at least moderately enlarged. Calcified aortic knob. Mild bronchitic changes. Status post median sternotomy for CABG. Mildly elevated RIGHT hemidiaphragm with bibasilar strandy densities. No pleural effusion or focal consolidation. No pneumothorax. IMPRESSION: Stable cardiomegaly. Mild bronchitic changes. Bibasilar atelectasis. Electronically Signed   By: Elon Alas M.D.   On: 08/01/2017 17:39    Procedures Procedures (including critical care time)  Medications Ordered in ED Medications  cefTRIAXone (ROCEPHIN) 1 g in  dextrose 5 % 50 mL IVPB (not administered)  sodium chloride 0.9 % bolus 1,000 mL (0 mLs Intravenous Stopped 08/01/17 2144)  levofloxacin (LEVAQUIN) tablet 500 mg (500 mg Oral Given 08/01/17 2140)  sodium chloride 0.9 % bolus 1,000 mL (1,000 mLs Intravenous New Bag/Given 08/01/17 2145)     Initial Impression / Assessment and Plan / ED  Course  I have reviewed the triage vital signs and the nursing notes.  Pertinent labs & imaging results that were available during my care of the patient were reviewed by me and considered in my medical decision making (see chart for details). Patient initially presented with chest fever.  Labs were essentially unremarkable except for slightly elevated white blood count.  Past medical history showed patient was admitted for sepsis 2 months ago.  While in the emergency department here the patient became slightly hypotensive responded some with IV fluids and seems to be mildly dehydrated.  He will be admitted for observation for the fever dehydration and he has blood cultures and urine cultures pending.   CRITICAL CARE Performed by: Milton Ferguson Total critical care time: 40 minutes Critical care time was exclusive of separately billable procedures and treating other patients. Critical care was necessary to treat or prevent imminent or life-threatening deterioration. Critical care was time spent personally by me on the following activities: development of treatment plan with patient and/or surrogate as well as nursing, discussions with consultants, evaluation of patient's response to treatment, examination of patient, obtaining history from patient or surrogate, ordering and performing treatments and interventions, ordering and review of laboratory studies, ordering and review of radiographic studies, pulse oximetry and re-evaluation of patient's condition.    Final Clinical Impressions(s) / ED Diagnoses   Final diagnoses:  Fever, unspecified fever cause    ED  Discharge Orders    None       Milton Ferguson, MD 08/01/17 2202    Milton Ferguson, MD 08/01/17 2203

## 2017-08-02 ENCOUNTER — Inpatient Hospital Stay (HOSPITAL_COMMUNITY): Payer: Medicare Other

## 2017-08-02 DIAGNOSIS — N183 Chronic kidney disease, stage 3 unspecified: Secondary | ICD-10-CM

## 2017-08-02 DIAGNOSIS — N179 Acute kidney failure, unspecified: Secondary | ICD-10-CM

## 2017-08-02 LAB — CBC WITH DIFFERENTIAL/PLATELET
BASOS PCT: 0 %
Basophils Absolute: 0 10*3/uL (ref 0.0–0.1)
EOS ABS: 0.2 10*3/uL (ref 0.0–0.7)
EOS PCT: 1 %
HCT: 39.3 % (ref 39.0–52.0)
Hemoglobin: 12.4 g/dL — ABNORMAL LOW (ref 13.0–17.0)
LYMPHS ABS: 0.8 10*3/uL (ref 0.7–4.0)
Lymphocytes Relative: 7 %
MCH: 28.1 pg (ref 26.0–34.0)
MCHC: 31.6 g/dL (ref 30.0–36.0)
MCV: 88.9 fL (ref 78.0–100.0)
MONOS PCT: 12 %
Monocytes Absolute: 1.5 10*3/uL — ABNORMAL HIGH (ref 0.1–1.0)
Neutro Abs: 9.6 10*3/uL — ABNORMAL HIGH (ref 1.7–7.7)
Neutrophils Relative %: 80 %
PLATELETS: 307 10*3/uL (ref 150–400)
RBC: 4.42 MIL/uL (ref 4.22–5.81)
RDW: 15.6 % — ABNORMAL HIGH (ref 11.5–15.5)
WBC: 12.1 10*3/uL — AB (ref 4.0–10.5)

## 2017-08-02 LAB — BASIC METABOLIC PANEL
Anion gap: 12 (ref 5–15)
BUN: 35 mg/dL — AB (ref 6–20)
CO2: 20 mmol/L — ABNORMAL LOW (ref 22–32)
CREATININE: 2.02 mg/dL — AB (ref 0.61–1.24)
Calcium: 8.5 mg/dL — ABNORMAL LOW (ref 8.9–10.3)
Chloride: 100 mmol/L — ABNORMAL LOW (ref 101–111)
GFR calc Af Amer: 37 mL/min — ABNORMAL LOW (ref >60)
GFR, EST NON AFRICAN AMERICAN: 32 mL/min — AB (ref >60)
Glucose, Bld: 301 mg/dL — ABNORMAL HIGH (ref 65–99)
Potassium: 4.3 mmol/L (ref 3.5–5.1)
SODIUM: 132 mmol/L — AB (ref 135–145)

## 2017-08-02 LAB — GLUCOSE, CAPILLARY
GLUCOSE-CAPILLARY: 182 mg/dL — AB (ref 65–99)
Glucose-Capillary: 242 mg/dL — ABNORMAL HIGH (ref 65–99)
Glucose-Capillary: 179 mg/dL — ABNORMAL HIGH (ref 65–99)
Glucose-Capillary: 344 mg/dL — ABNORMAL HIGH (ref 65–99)

## 2017-08-02 LAB — MRSA PCR SCREENING: MRSA by PCR: NEGATIVE

## 2017-08-02 MED ORDER — SODIUM CHLORIDE 0.9 % IV SOLN: INTRAVENOUS | Status: DC

## 2017-08-02 MED ORDER — SODIUM CHLORIDE 0.9 % IV SOLN
INTRAVENOUS | Status: DC
  Administered 2017-08-02: 12:00:00 via INTRAVENOUS

## 2017-08-02 MED ORDER — VANCOMYCIN HCL IN DEXTROSE 1-5 GM/200ML-% IV SOLN
1000.0000 mg | INTRAVENOUS | Status: DC
  Administered 2017-08-02: 1000 mg via INTRAVENOUS
  Filled 2017-08-02: qty 200

## 2017-08-02 MED ORDER — PIPERACILLIN-TAZOBACTAM 3.375 G IVPB
3.3750 g | Freq: Three times a day (TID) | INTRAVENOUS | Status: DC
  Administered 2017-08-02 – 2017-08-03 (×4): 3.375 g via INTRAVENOUS
  Filled 2017-08-02 (×4): qty 50

## 2017-08-02 NOTE — Progress Notes (Signed)
Transported to 301,  Alert and oriented, denied pain,  Knows to not eat or drink until abd ultrasound,  Call bell in reach

## 2017-08-02 NOTE — Progress Notes (Signed)
Report called to Lowry Ram, RN on unit 300.  Patient to transfer to room 301.

## 2017-08-02 NOTE — Progress Notes (Signed)
PROGRESS NOTE    [name]  JSH:[id number] DOB: 07-16-1946 DOA: 08/01/2017 PCP: [name], MD     Brief Narrative:  71 year old man admitted from home on 12/19 after an episode of rigors and a temp of 101.6 at home.  His past medical history is significant for a hospitalization in October for E. coli bacteremia of unknown source for which he completed 5 days of Augmentin.   Assessment & Plan:   Principal Problem:   Sepsis due to undetermined organism [facility]) Active Problems:   Essential hypertension   Hypotension   Neuropathy, peripheral   HLD (hyperlipidemia)   AKI (acute kidney injury) ([place])   Type 2 diabetes mellitus (HCC)   Hyponatremia   PAD (peripheral artery disease) (HCC)   GERD (gastroesophageal reflux disease)   CKD (chronic kidney disease) stage 3, GFR 30-59 ml/min (HCC)   Sepsis due to undetermined organism -Sepsis parameters have resolved, patient was initially hypotensive and responded to IV fluid boluses. -UA is negative, chest x-ray without indication of infection. -I am concerned for repeat E. coli bacteremia, blood cultures are currently pending. -Given unknown source and presumed E. coli bacteremia, will do right upper quadrant ultrasound to evaluate gallbladder, if this is negative can consider CT of the abdomen and pelvis.  Important to note that his LFTs are within normal limits. -For now he continues on vancomycin and Zosyn, will continue these an additional 24 hours while we wait for culture data.  Hyperlipidemia -Continue simvastatin 40 mg daily.  Type 2 diabetes -Fair control, continue current regimen of Lantus 20 units at bedtime as well as sliding scale. -Monitor for need of adjustments.  Hyponatremia -Improved with IV fluid boluses. -Continue fluids.  GERD -Continue Protonix  Peripheral artery disease -Continue Pletal 100 mg twice daily.  Acute on chronic kidney disease stage III -Baseline creatinine around 1.6, creatinine on  admission was 1.8 and up to 2.02 today. -He was not placed on maintenance IV fluids, will initiate today. -No signs of obstructive uropathy, follow renal function closely.   DVT prophylaxis: Lovenox Code Status: Full code Family Communication: Wife at bedside updated on plan of care and questions answered Disposition Plan: Transfer to floor, await culture data, continue broad-spectrum antibiotics  Consultants:   None  Procedures:   None  Antimicrobials:  Anti-infectives (From admission, onward)   Start     Dose/Rate Route Frequency Ordered Stop   08/03/17 0000  vancomycin (VANCOCIN) IVPB 1000 mg/200 mL premix     1,000 mg 200 mL/hr over 60 Minutes Intravenous Dunn 24 hours 08/02/17 0033     08/02/17 0800  piperacillin-tazobactam (ZOSYN) IVPB 3.375 g     3.375 g 12.5 mL/hr over 240 Minutes Intravenous Dunn 8 hours 08/02/17 0030     08/01/17 2330  piperacillin-tazobactam (ZOSYN) IVPB 3.375 g     3.375 g 100 mL/hr over 30 Minutes Intravenous  Once 08/01/17 2320 08/02/17 0032   08/01/17 2330  vancomycin (VANCOCIN) IVPB 1000 mg/200 mL premix     1,000 mg 200 mL/hr over 60 Minutes Intravenous  Once 08/01/17 2320 08/02/17 0102   08/01/17 2200  cefTRIAXone (ROCEPHIN) 1 g in dextrose 5 % 50 mL IVPB     1 g Intravenous  Once 08/01/17 2146 08/01/17 2244   08/01/17 2130  levofloxacin (LEVAQUIN) tablet 500 mg     500 mg Oral  Once 08/01/17 2120 08/01/17 2140       Subjective: Feels well, in fact so much better that he wants to be  discharged.  Objective: Vitals:   08/02/17 0700 08/02/17 0800 08/02/17 0900 08/02/17 1000  BP: (!) 96/51 (!) 111/59 (!) 107/54 (!) 117/50  Pulse: 72 72 87 86  Resp: 13 13 10  (!) 9  Temp:      TempSrc:      SpO2: 97% 100% 99% 99%  Weight:      Height:        Intake/Output Summary (Last 24 hours) at 08/02/2017 1041 Last data filed at 08/02/2017 0935 Gross per 24 hour  Intake -  Output 400 ml  Net -400 ml   Filed Weights   08/01/17 1623  08/01/17 2326  Weight: 74.8 kg (165 lb) 72.7 kg (160 lb 4.4 oz)    Examination:  General exam: Alert, awake, oriented x 3 Respiratory system: Clear to auscultation. Respiratory effort normal. Cardiovascular system:RRR. No murmurs, rubs, gallops. Gastrointestinal system: Abdomen is nondistended, soft and nontender. No organomegaly or masses felt. Normal bowel sounds heard. Central nervous system: Alert and oriented. No focal neurological deficits. Extremities: No C/C/E, +pedal pulses Skin: No rashes, lesions or ulcers Psychiatry: Judgement and insight appear normal. Mood & affect appropriate.     Data Reviewed: I have personally reviewed following labs and imaging studies  CBC: Recent Labs  Lab 08/01/17 1633 08/02/17 0421  WBC 11.5* 12.1*  NEUTROABS 11.1* 9.6*  HGB 14.4 12.4*  HCT 43.9 39.3  MCV 87.5 88.9  PLT 355 102   Basic Metabolic Panel: Recent Labs  Lab 08/01/17 1633 08/02/17 0421  NA 131* 132*  K 4.4 4.3  CL 98* 100*  CO2 19* 20*  GLUCOSE 255* 301*  BUN 34* 35*  CREATININE 1.86* 2.02*  CALCIUM 9.3 8.5*  MG 2.0  --    GFR: Estimated Creatinine Clearance: 31.1 mL/min (A) (by C-G formula based on SCr of 2.02 mg/dL (H)). Liver Function Tests: Recent Labs  Lab 08/01/17 1633  AST 14*  ALT 12*  ALKPHOS 80  BILITOT 0.8  PROT 7.3  ALBUMIN 3.8   No results for input(s): LIPASE, AMYLASE in the last 168 hours. No results for input(s): AMMONIA in the last 168 hours. Coagulation Profile: Recent Labs  Lab 08/01/17 1633  INR 0.97   Cardiac Enzymes: No results for input(s): CKTOTAL, CKMB, CKMBINDEX, TROPONINI in the last 168 hours. BNP (last 3 results) No results for input(s): PROBNP in the last 8760 hours. HbA1C: No results for input(s): HGBA1C in the last 72 hours. CBG: Recent Labs  Lab 08/02/17 0812  GLUCAP 182*   Lipid Profile: No results for input(s): CHOL, HDL, LDLCALC, TRIG, CHOLHDL, LDLDIRECT in the last 72 hours. Thyroid Function  Tests: No results for input(s): TSH, T4TOTAL, FREET4, T3FREE, THYROIDAB in the last 72 hours. Anemia Panel: No results for input(s): VITAMINB12, FOLATE, FERRITIN, TIBC, IRON, RETICCTPCT in the last 72 hours. Urine analysis:    Component Value Date/Time   COLORURINE YELLOW 08/01/2017 1621   APPEARANCEUR CLEAR 08/01/2017 1621   LABSPEC 1.020 08/01/2017 1621   PHURINE 5.0 08/01/2017 1621   GLUCOSEU >=500 (A) 08/01/2017 1621   HGBUR NEGATIVE 08/01/2017 1621   BILIRUBINUR NEGATIVE 08/01/2017 1621   KETONESUR NEGATIVE 08/01/2017 1621   PROTEINUR NEGATIVE 08/01/2017 1621   UROBILINOGEN 0.2 03/16/2015 0120   NITRITE NEGATIVE 08/01/2017 1621   LEUKOCYTESUR NEGATIVE 08/01/2017 1621   Sepsis Labs: @LABRCNTIP (procalcitonin:4,lacticidven:4)  ) Recent Results (from the past 240 hour(s))  Culture, blood (Routine x 2)     Status: None (Preliminary result)   Collection Time: 08/01/17  4:33 PM  Result Value Ref Range Status   Specimen Description RIGHT ANTECUBITAL DRAWN BY RN  Final   Special Requests   Final    BOTTLES DRAWN AEROBIC AND ANAEROBIC Blood Culture results may not be optimal due to an inadequate volume of blood received in culture bottles   Culture NO GROWTH < 24 HOURS  Final   Report Status PENDING  Incomplete  Culture, blood (Routine x 2)     Status: None (Preliminary result)   Collection Time: 08/01/17  4:33 PM  Result Value Ref Range Status   Specimen Description LEFT ANTECUBITAL  Final   Special Requests   Final    BOTTLES DRAWN AEROBIC AND ANAEROBIC Blood Culture adequate volume   Culture NO GROWTH < 24 HOURS  Final   Report Status PENDING  Incomplete  MRSA PCR Screening     Status: None   Collection Time: 08/01/17 11:19 PM  Result Value Ref Range Status   MRSA by PCR NEGATIVE NEGATIVE Final    Comment:        The GeneXpert MRSA Assay (FDA approved for NASAL specimens only), is one component of a comprehensive MRSA colonization surveillance program. It is  not intended to diagnose MRSA infection nor to guide or monitor treatment for MRSA infections.          Radiology Studies: Dg Chest 2 View  Result Date: 08/01/2017 CLINICAL DATA:  Lethargy, fever and weakness today. History of hypertension, CABG. EXAM: CHEST  2 VIEW COMPARISON:  Chest radiograph May 31, 2017 FINDINGS: Cardiac silhouette is at least moderately enlarged. Calcified aortic knob. Mild bronchitic changes. Status post median sternotomy for CABG. Mildly elevated RIGHT hemidiaphragm with bibasilar strandy densities. No pleural effusion or focal consolidation. No pneumothorax. IMPRESSION: Stable cardiomegaly. Mild bronchitic changes. Bibasilar atelectasis. Electronically Signed   By: Elon Alas M.D.   On: 08/01/2017 17:39        Scheduled Meds: . aspirin EC  81 mg Oral Daily  . cholecalciferol  2,000 Units Oral Daily  . cilostazol  100 mg Oral BID  . enoxaparin (LOVENOX) injection  40 mg Subcutaneous Q24H  . ferrous sulfate  325 mg Oral Daily  . gabapentin  300 mg Oral Daily   And  . gabapentin  600 mg Oral QHS  . insulin aspart  0-9 Units Subcutaneous TID WC  . insulin glargine  20 Units Subcutaneous QHS  . pantoprazole  80 mg Oral BID  . simvastatin  20 mg Oral QHS   Continuous Infusions: . sodium chloride 100 mL/hr at 08/01/17 2347  . sodium chloride    . piperacillin-tazobactam (ZOSYN)  IV 3.375 g (08/02/17 0930)  . [START ON 08/03/2017] vancomycin       LOS: 1 day    Time spent: 25 minutes. Greater than 50% of this time was spent in direct contact with the patient coordinating care.     Lelon Frohlich, MD [facility] Pager [phone]  If 7PM-7AM, please contact night-coverage [email or web address] Password [email or web address] 08/02/2017, 10:41 AM

## 2017-08-02 NOTE — Progress Notes (Signed)
ANTIBIOTIC CONSULT NOTE - INITIAL  Pharmacy Consult for vancomycin and zosyn Indication: sepsis  Allergies  Allergen Reactions  . No Known Allergies     Patient Measurements: Height: 5\' 6"  (167.6 cm) Weight: 160 lb 4.4 oz (72.7 kg) IBW/kg (Calculated) : 63.8 kg   Vital Signs: Temp: 97.8 F (36.6 C) (12/19 2326) Temp Source: Oral (12/19 2326) BP: 128/65 (12/19 2300) Pulse Rate: 71 (12/19 2300) Intake/Output from previous day: No intake/output data recorded. Intake/Output from this shift: No intake/output data recorded.  Labs: Recent Labs    08/01/17 1633  WBC 11.5*  HGB 14.4  PLT 355  CREATININE 1.86*   Estimated Creatinine Clearance: 33.8 mL/min (A) (by C-G formula based on SCr of 1.86 mg/dL (H)).    Microbiology: Recent Results (from the past 720 hour(s))  Culture, blood (Routine x 2)     Status: None (Preliminary result)   Collection Time: 08/01/17  4:33 PM  Result Value Ref Range Status   Specimen Description RIGHT ANTECUBITAL DRAWN BY RN  Final   Special Requests   Final    BOTTLES DRAWN AEROBIC AND ANAEROBIC Blood Culture results may not be optimal due to an inadequate volume of blood received in culture bottles   Culture PENDING  Incomplete   Report Status PENDING  Incomplete  Culture, blood (Routine x 2)     Status: None (Preliminary result)   Collection Time: 08/01/17  4:33 PM  Result Value Ref Range Status   Specimen Description LEFT ANTECUBITAL  Final   Special Requests   Final    BOTTLES DRAWN AEROBIC AND ANAEROBIC Blood Culture adequate volume   Culture PENDING  Incomplete   Report Status PENDING  Incomplete    Medical History: Past Medical History:  Diagnosis Date  . CAD S/P percutaneous coronary angioplasty 1998   PCI TO CX  . Diabetes mellitus without complication ([place])   . GERD (gastroesophageal reflux disease)   . Headache   . 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 Hypercholesteremia   . Hypertension   . Neuropathy   . Peripheral  vascular disease ([place])    s/p R-L FEM-FEM BYPASS  . Tachycardia    after CABG, pt on medicine for this    Medications:  Medications Prior to Admission  Medication Sig Dispense Refill Last Dose  . amLODipine (NORVASC) 10 MG tablet Take 10 mg by mouth daily.   08/01/2017 at Unknown time  . aspirin 81 MG tablet Take 81 mg by mouth daily.   08/01/2017 at Unknown time  . Cholecalciferol (VITAMIN D) 2000 UNITS tablet Take 2,000 Units by mouth daily.   08/01/2017 at Unknown time  . cilostazol (PLETAL) 100 MG tablet Take 100 mg by mouth 2 (two) times daily.   08/01/2017 at Unknown time  . ferrous sulfate 325 (65 FE) MG tablet Take 325 mg by mouth daily.    08/01/2017 at Unknown time  . gabapentin (NEURONTIN) 300 MG capsule Take 300-600 mg by mouth 2 (two) times daily. 300mg  in the morning and 600mg  at bedtime   08/01/2017 at Unknown time  . insulin glargine (LANTUS) 100 UNIT/ML injection Give 10 units before bedtime each day. (Patient taking differently: 20 Units at bedtime. Give 10 units before bedtime each day.) 10 mL 3 07/31/2017 at Unknown time  . metoprolol tartrate (LOPRESSOR) 25 MG tablet Take 1 tablet (25 mg total) by mouth 2 (two) times daily. 180 tablet 3 08/01/2017 at [facility]  . pantoprazole (PROTONIX) 40 MG tablet  Take 80 mg by mouth 2 (two) times daily.    08/01/2017 at Unknown time  . simvastatin (ZOCOR) 40 MG tablet Take 20 mg by mouth at bedtime.    07/31/2017 at Unknown time   Scheduled:  . aspirin EC  81 mg Oral Daily  . cholecalciferol  2,000 Units Oral Daily  . cilostazol  100 mg Oral BID  . enoxaparin (LOVENOX) injection  40 mg Subcutaneous Q24H  . ferrous sulfate  325 mg Oral Daily  . gabapentin  300 mg Oral Daily   And  . gabapentin  600 mg Oral QHS  . insulin aspart  0-9 Units Subcutaneous TID WC  . insulin glargine  20 Units Subcutaneous QHS  . pantoprazole  80 mg Oral BID  . simvastatin  20 mg Oral QHS   Infusions:  . sodium chloride 100 mL/hr at 08/01/17 2347  .  piperacillin-tazobactam (ZOSYN)  IV    . vancomycin 1,000 mg (08/02/17 0002)  . [START ON 08/03/2017] vancomycin     PRN:  Anti-infectives (From admission, onward)   Start     Dose/Rate Route Frequency Ordered Stop   08/03/17 0000  vancomycin (VANCOCIN) IVPB 1000 mg/200 mL premix     1,000 mg 200 mL/hr over 60 Minutes Intravenous Every 24 hours 08/02/17 0033     08/02/17 0800  piperacillin-tazobactam (ZOSYN) IVPB 3.375 g     3.375 g 12.5 mL/hr over 240 Minutes Intravenous Every 8 hours 08/02/17 0030     08/01/17 2330  piperacillin-tazobactam (ZOSYN) IVPB 3.375 g     3.375 g 100 mL/hr over 30 Minutes Intravenous  Once 08/01/17 2320 08/02/17 0032   08/01/17 2330  vancomycin (VANCOCIN) IVPB 1000 mg/200 mL premix     1,000 mg 200 mL/hr over 60 Minutes Intravenous  Once 08/01/17 2320     08/01/17 2200  cefTRIAXone (ROCEPHIN) 1 g in dextrose 5 % 50 mL IVPB     1 g Intravenous  Once 08/01/17 2146 08/01/17 2244   08/01/17 2130  levofloxacin (LEVAQUIN) tablet 500 mg     500 mg Oral  Once 08/01/17 2120 08/01/17 2140     Assessment: Pt received Rocephin and levofloxacin in ED.  Now patient is being started on Vancomycin and Zosyn for R/O sepsis indication. 71 yr old male with fever and chills.  Pt has hx of renal insufficiency with current SCr 1.86 and estimated CrCl of 33.8 ml/min.  Goal of Therapy:  Vancomycin trough level 15-20 mcg/ml  Plan:  Zosyn 3.375 grams IV Q8hr Vancomycin 1 gram IV Q24hr Vancomycin level at steady state if indicated. Follow renal function and monitor blood and urine culture results.  Beryle Lathe 08/02/2017,12:35 AM

## 2017-08-02 NOTE — Progress Notes (Signed)
[place] complete in room .  Requested food and drink and received food from unt 300 kitchen, Wife at bedside

## 2017-08-02 NOTE — Progress Notes (Signed)
Late entry:  Patient transported to room 301.  Patient belongings intact and in possession.  IV WNL.  Patient oriented to room with call-bell in reach.  Patient educated on call-bell.  Patient stable.  Nurse notified of patient's arrival on unit.

## 2017-08-03 LAB — CBC WITH DIFFERENTIAL/PLATELET
BASOS ABS: 0 10*3/uL (ref 0.0–0.1)
Basophils Relative: 1 %
EOS ABS: 0.2 10*3/uL (ref 0.0–0.7)
EOS PCT: 3 %
HCT: 40.7 % (ref 39.0–52.0)
HEMOGLOBIN: 13.1 g/dL (ref 13.0–17.0)
Lymphocytes Relative: 17 %
Lymphs Abs: 1.3 10*3/uL (ref 0.7–4.0)
MCH: 28.5 pg (ref 26.0–34.0)
MCHC: 32.2 g/dL (ref 30.0–36.0)
MCV: 88.7 fL (ref 78.0–100.0)
Monocytes Absolute: 1.4 10*3/uL — ABNORMAL HIGH (ref 0.1–1.0)
Monocytes Relative: 18 %
NEUTROS PCT: 61 %
Neutro Abs: 4.8 10*3/uL (ref 1.7–7.7)
PLATELETS: 320 10*3/uL (ref 150–400)
RBC: 4.59 MIL/uL (ref 4.22–5.81)
RDW: 15.7 % — ABNORMAL HIGH (ref 11.5–15.5)
WBC: 7.8 10*3/uL (ref 4.0–10.5)

## 2017-08-03 LAB — URINE CULTURE: Culture: <10000 — AB

## 2017-08-03 LAB — BASIC METABOLIC PANEL
ANION GAP: 11 (ref 5–15)
BUN: 27 mg/dL — AB (ref 6–20)
CHLORIDE: 100 mmol/L — AB (ref 101–111)
CO2: 22 mmol/L (ref 22–32)
Calcium: 8.9 mg/dL (ref 8.9–10.3)
Creatinine, Ser: 1.75 mg/dL — ABNORMAL HIGH (ref 0.61–1.24)
GFR, EST AFRICAN AMERICAN: 44 mL/min — AB (ref >60)
GFR, EST NON AFRICAN AMERICAN: 38 mL/min — AB (ref >60)
Glucose, Bld: 253 mg/dL — ABNORMAL HIGH (ref 65–99)
POTASSIUM: 4.3 mmol/L (ref 3.5–5.1)
SODIUM: 133 mmol/L — AB (ref 135–145)

## 2017-08-03 LAB — GLUCOSE, CAPILLARY
GLUCOSE-CAPILLARY: 195 mg/dL — AB (ref 65–99)
GLUCOSE-CAPILLARY: 323 mg/dL — AB (ref 65–99)

## 2017-08-03 MED ORDER — INSULIN GLARGINE 100 UNIT/ML ~~LOC~~ SOLN: 20.0000 [IU] | Freq: Every day | SUBCUTANEOUS | 11 refills | Status: DC

## 2017-08-03 NOTE — Care Management Important Message (Signed)
Important Message  Patient Details  Name: [name] MRN: [id number] Date of Birth: 11/12/1945   Medicare Important Message Given:  Yes    Alexsia Klindt, Chauncey Reading, RN 08/03/2017, 2:18 PM

## 2017-08-03 NOTE — Progress Notes (Signed)
Upon assessment tonight, patient and his wife had many concerns as far as his treatment.  They felt like no one had explained patient's condition to them.  I reassured the patient and his wife that treatment for his condition was appropriate and that I would let Dr. Jerilee Hoh know about their concerns on December 21.  The patient does not understand where the sepsis is coming from and why he keeps getting pneumonia?  They would also like to know if the sepsis is coming from previous episode and why it did not clear up?  The patient also wanted it to be noted that during conversation and assessment, he feels that his low blood pressures were coming from some medication adjustments made over the phone with the [place].  I also told both, the patient and his wife, that I would pass this information on to Dr. Jerilee Hoh on December 21.    The patient stated that on his last discharge from this hospital the physician had made changes to BP meds and discontinued some of them.  His appointment for routine follow up had to be postponed at the [facility] and medication adjustments were made over the phone.  The patient now remembers restarting medications that were discontinued here, at [facility].  He now feels like this has played a role in his BP readings being low during this admission.

## 2017-08-03 NOTE — Progress Notes (Signed)
Pt's IV catheter removed and intact. Pt's IV site clean dry and intact. Discharge instructions including medications and follow up appointments were reviewed and discussed with patient. All questions were answered and no further questions at this time. Pt in stable condition and in no acute distress at time of discharge. Pt will be escorted by nurse tech.  

## 2017-08-03 NOTE — Discharge Summary (Signed)
Physician Discharge Summary  [name] CNO:[id number] DOB: April 20, 1946 DOA: 08/01/2017  PCP: [name], MD  Admit date: 08/01/2017 Discharge date: 08/03/2017  Time spent: 45 minutes  Recommendations for Outpatient Follow-up:  -Will be discharged home today. -Advised to follow-up with PCP in 2 weeks.  Discharge Diagnoses:  Principal Problem:   Sepsis due to undetermined organism [facility]) Active Problems:   Essential hypertension   Hypotension   Neuropathy, peripheral   HLD (hyperlipidemia)   AKI (acute kidney injury) ([place])   Type 2 diabetes mellitus ([facility])   Hyponatremia   PAD (peripheral artery disease) ([facility])   GERD (gastroesophageal reflux disease)   CKD (chronic kidney disease) stage 3, GFR 30-59 ml/min ([place])   Discharge Condition: Stable and improved  Filed Weights   08/01/17 1623 08/01/17 2326  Weight: 74.8 kg (165 lb) 72.7 kg (160 lb 4.4 oz)    History of present illness:  As per Dr. Olevia Bowens on 12/19: [name] is a 71 y.o. male with medical history significant of quintuple CAD/CABG, type 2 diabetes, GERD, headaches, hyperlipidemia, hypertension, diabetic peripheral neuropathy, peripheral vascular disease, unspecified tachycardia, multiple episodes of pneumonia who is brought to the emergency department due to fever.  Per patient, he woke up in his usual state of health this morning.  He had cereal for breakfast.  After this, he went to the cemetery to visit a grave and place some flowers on it.  When he returned home around 1100, he felt weak, stayed on his truck and coincidentally at that moment his wife [name], who was shopping called him.  She noticed he was not himself and not feeling well.  He told her that he was unable to get out of his truck.  He was having a lot of rigors.  His wife return, he subsequently went home and later in the evening developed fever.  His wife stated that these symptoms are similar to that once he gets when he is having a  pneumonia.  Back in October, he was admitted for E. coli bacteremia, but no other specific source was found.  He denies headache, earaches, sore throat, rhinorrhea, productive cough, wheezing, chest pain, palpitations, dizziness, diaphoresis, PND, orthopnea or pitting edema of the lower extremities.  No abdominal pain, nausea, emesis, diarrhea, melena or hematochezia.  Denies dysuria, frequency or hematuria.  No polyuria, polydipsia, polyphagia or blurred vision.  He denies pruritus or skin rashes.  ED Course: Initial vital signs temperature 37.2, then recheck about 20 minutes later 38.1 C, pulse 101, respirations 15 and blood pressure 130/59.  After the patient became febrile, he was followed by persistent systolic hypotension in the 90s and low 100s, despite IV fluids.  He was given 2000 mL of NS bolus.  Also Levaquin 500 mg p.o. x1 dose and Rocephin 1 g IVPB.  His workup shows an urinalysis with glucosuria over 500 mg/dL, but otherwise normal.  WBC of 11.5 with 98% neutrophils, hemoglobin 14.4 g/dL and platelets 355.  PT and INR were normal.  His lactic acid was normal.  Sodium was 131, potassium 4.4, chloride 98 and bicarbonate 19 mmol/L.  Glucose 255, BUN 34 and creatinine 1.86 mg/dL.  His transaminases were low, but LFTs otherwise unremarkable.  His EKG showed sinus tachycardia at 100 bpm with borderline intraventricular conduction delay.  Chest radiograph showed cardiomegaly, mild bronchitic changes with bibasilar atelectasis, but no pleural effusion or focal consolidation.    Hospital Course:   Sepsis due to undetermined organism -Sepsis parameters  have resolved, patient was initially hypotensive and responded to IV fluid boluses.  On day of discharge patient tells me that he had a recently started 2 new antihypertensives, amlodipine and metoprolol at the urging of the [place] over phone without ever having a face-to-face meeting or checking his blood pressure.  Have advised him to discontinue use  of both of these medications until further seen at the [place] as his blood pressure remains in the low 100s and he has not received any of these medications during his hospital stay. -UA is negative, chest x-ray without indication of infection. -Despite initial concern for repeat E. coli bacteremia, blood cultures remain negative at day 2, no further antibiotics are recommended at time of discharge. -He did have an ultrasound of his gallbladder looking for potential sources of E. coli and this was within normal limits.  Hyperlipidemia -Continue simvastatin 40 mg daily.  Type 2 diabetes -Fair control, continue current regimen of Lantus 20 units at bedtime. -May need adjustment in the outpatient setting  Hyponatremia -Improved with IV fluids -Continue fluids.  GERD -Continue Protonix  Peripheral artery disease -Continue Pletal 100 mg twice daily.  Acute on chronic kidney disease stage III -Baseline creatinine around 1.6, creatinine on discharge is 1.7.  On admission was 2.02. -No signs of obstructive uropathy, follow renal function closely.     Procedures:  None   Consultations:  None  Discharge Instructions   Allergies as of 08/03/2017      Reactions   No Known Allergies       Medication List    STOP taking these medications   amLODipine 10 MG tablet Commonly known as:  NORVASC   metoprolol tartrate 25 MG tablet Commonly known as:  LOPRESSOR     TAKE these medications   aspirin 81 MG tablet Take 81 mg by mouth daily.   cilostazol 100 MG tablet Commonly known as:  PLETAL Take 100 mg by mouth 2 (two) times daily.   ferrous sulfate 325 (65 FE) MG tablet Take 325 mg by mouth daily.   gabapentin 300 MG capsule Commonly known as:  NEURONTIN Take 300-600 mg by mouth 2 (two) times daily. 300mg  in the morning and 600mg  at bedtime   insulin glargine 100 UNIT/ML injection Commonly known as:  LANTUS Inject 0.2 mLs (20 Units total) into the skin at  bedtime. What changed:    how much to take  how to take this  when to take this  additional instructions   pantoprazole 40 MG tablet Commonly known as:  PROTONIX Take 80 mg by mouth 2 (two) times daily.   simvastatin 40 MG tablet Commonly known as:  ZOCOR Take 20 mg by mouth at bedtime.   Vitamin D 2000 units tablet Take 2,000 Units by mouth daily.      Allergies  Allergen Reactions  . No Known Allergies    Follow-up Information    [name], MD. Schedule an appointment as soon as possible for a visit in 2 week(s).   Specialty:  Nurse Practitioner Contact information: [place] [address] [phone]            The results of significant diagnostics from this hospitalization (including imaging, microbiology, ancillary and laboratory) are listed below for reference.    Significant Diagnostic Studies: Dg Chest 2 View  Result Date: 08/01/2017 CLINICAL DATA:  Lethargy, fever and weakness today. History of hypertension, CABG. EXAM: CHEST  2 VIEW COMPARISON:  Chest radiograph May 31, 2017 FINDINGS:  Cardiac silhouette is at least moderately enlarged. Calcified aortic knob. Mild bronchitic changes. Status post median sternotomy for CABG. Mildly elevated RIGHT hemidiaphragm with bibasilar strandy densities. No pleural effusion or focal consolidation. No pneumothorax. IMPRESSION: Stable cardiomegaly. Mild bronchitic changes. Bibasilar atelectasis. Electronically Signed   By: Elon Alas M.D.   On: 08/01/2017 17:39   US Abdomen Limited Ruq  Result Date: 08/02/2017 CLINICAL DATA:  Fever and bacteremia of unknown source. EXAM: ULTRASOUND ABDOMEN LIMITED RIGHT UPPER QUADRANT COMPARISON:  No prior ultrasound. PET-CT 11/30/2015 and CT abdomen 02/02/2005. FINDINGS: Gallbladder: No shadowing gallstones or echogenic sludge. Approximate 4 mm polyp arising from the anterior wall of the gallbladder body. No gallbladder wall thickening or  pericholecystic fluid. Negative sonographic Murphy's sign according to the ultrasound technologist. Common bile duct: Diameter: Approximately 5 mm centrally. The distal duct is obscured by duodenal bowel gas. Liver: Normal size and echotexture without focal parenchymal abnormality. Portal vein is patent on color Doppler imaging with normal direction of blood flow towards the liver. IMPRESSION: 1. No evidence of cholelithiasis or cholecystitis. No biliary ductal dilation. 2. Benign 4 mm polyp involving the anterior wall of the body of the gallbladder. 3. Normal appearing liver. Electronically Signed   By: Evangeline Dakin M.D.   On: 08/02/2017 15:15    Microbiology: Recent Results (from the past 240 hour(s))  Culture, blood (Routine x 2)     Status: None (Preliminary result)   Collection Time: 08/01/17  4:33 PM  Result Value Ref Range Status   Specimen Description RIGHT ANTECUBITAL DRAWN BY RN  Final   Special Requests   Final    BOTTLES DRAWN AEROBIC AND ANAEROBIC Blood Culture results may not be optimal due to an inadequate volume of blood received in culture bottles   Culture NO GROWTH 2 DAYS  Final   Report Status PENDING  Incomplete  Culture, blood (Routine x 2)     Status: None (Preliminary result)   Collection Time: 08/01/17  4:33 PM  Result Value Ref Range Status   Specimen Description LEFT ANTECUBITAL  Final   Special Requests   Final    BOTTLES DRAWN AEROBIC AND ANAEROBIC Blood Culture adequate volume   Culture NO GROWTH 2 DAYS  Final   Report Status PENDING  Incomplete  Urine Culture     Status: Abnormal   Collection Time: 08/01/17  4:48 PM  Result Value Ref Range Status   Specimen Description URINE, CLEAN CATCH  Final   Special Requests NONE  Final   Culture (A)  Final    <10,000 COLONIES/mL INSIGNIFICANT GROWTH Performed at [facility], [address], [place], [place] [address]    Report Status 08/03/2017 FINAL  Final  MRSA PCR Screening     Status: None    Collection Time: 08/01/17 11:19 PM  Result Value Ref Range Status   MRSA by PCR NEGATIVE NEGATIVE Final    Comment:        The GeneXpert MRSA Assay (FDA approved for NASAL specimens only), is one component of a comprehensive MRSA colonization surveillance program. It is not intended to diagnose MRSA infection nor to guide or monitor treatment for MRSA infections.      Labs: Basic Metabolic Panel: Recent Labs  Lab 08/01/17 1633 08/02/17 0421 08/03/17 0437  NA 131* 132* 133*  K 4.4 4.3 4.3  CL 98* 100* 100*  CO2 19* 20* 22  GLUCOSE 255* 301* 253*  BUN 34* 35* 27*  CREATININE 1.86* 2.02* 1.75*  CALCIUM  9.3 8.5* 8.9  MG 2.0  --   --    Liver Function Tests: Recent Labs  Lab 08/01/17 1633  AST 14*  ALT 12*  ALKPHOS 80  BILITOT 0.8  PROT 7.3  ALBUMIN 3.8   No results for input(s): LIPASE, AMYLASE in the last 168 hours. No results for input(s): AMMONIA in the last 168 hours. CBC: Recent Labs  Lab 08/01/17 1633 08/02/17 0421 08/03/17 0437  WBC 11.5* 12.1* 7.8  NEUTROABS 11.1* 9.6* 4.8  HGB 14.4 12.4* 13.1  HCT 43.9 39.3 40.7  MCV 87.5 88.9 88.7  PLT 355 307 320   Cardiac Enzymes: No results for input(s): CKTOTAL, CKMB, CKMBINDEX, TROPONINI in the last 168 hours. BNP: BNP (last 3 results) No results for input(s): BNP in the last 8760 hours.  ProBNP (last 3 results) No results for input(s): PROBNP in the last 8760 hours.  CBG: Recent Labs  Lab 08/02/17 1145 08/02/17 1627 08/02/17 2126 08/03/17 0736 08/03/17 1108  GLUCAP 242* 179* 344* 195* 323*       Signed:  Lelon Frohlich  [facility] Pager: [phone] 08/03/2017, 2:50 PM

## 2017-08-03 NOTE — Progress Notes (Signed)
Inpatient Diabetes Program Recommendations  AACE/ADA: New Consensus Statement on Inpatient Glycemic Control (2015)  Target Ranges:  Prepandial:   less than 140 mg/dL      Peak postprandial:   less than 180 mg/dL (1-2 hours)      Critically ill patients:  140 - 180 mg/dL   Results for [name], [name] (MRN [id number]) as of 08/03/2017 07:23  Ref. Range 08/02/2017 08:12 08/02/2017 11:45 08/02/2017 16:27 08/02/2017 21:26  Glucose-Capillary Latest Ref Range: 65 - 99 mg/dL 182 (H) 242 (H) 179 (H) 344 (H)   Results for [name], [name] (MRN [id number]) as of 08/03/2017 07:23  Ref. Range 08/03/2017 04:37  Glucose Latest Ref Range: 65 - 99 mg/dL 253 (H)    Home DM Meds: Lantus 20 units QHS  Current Insulin Orders: Lantus 20 units QHS      Novolog Sensitive Correction Scale/ SSI (0-9 units) TID AC       MD- Please consider the following in-hospital insulin adjustments:  1. Increase Lantus to 25 units QHS (~20% increase)  2. Start Novolog Meal Coverage: Novolog 4 units TID with meals (hold if pt eats <50% of meal)      --Will follow patient during hospitalization--  Wyn Quaker RN, MSN, CDE Diabetes Coordinator Inpatient Glycemic Control Team Team Pager: [phone] (8a-5p)

## 2017-08-06 LAB — CULTURE, BLOOD (ROUTINE X 2)
CULTURE: NO GROWTH
Culture: NO GROWTH
SPECIAL REQUESTS: ADEQUATE

## 2017-08-16 ENCOUNTER — Emergency Department (HOSPITAL_COMMUNITY): Payer: Medicare Other

## 2017-08-16 ENCOUNTER — Encounter (HOSPITAL_COMMUNITY): Payer: Self-pay | Admitting: *Deleted

## 2017-08-16 ENCOUNTER — Inpatient Hospital Stay (HOSPITAL_COMMUNITY)
Admission: EM | Admit: 2017-08-16 | Discharge: 2017-08-19 | DRG: 872 | Disposition: A | Discharge status: Home / Self Care | Payer: Medicare Other | Attending: Internal Medicine | Admitting: Internal Medicine

## 2017-08-16 DIAGNOSIS — I1310 Hypertensive heart and chronic kidney disease without heart failure, with stage 1 through stage 4 chronic kidney disease, or unspecified chronic kidney disease: Secondary | ICD-10-CM | POA: Diagnosis present

## 2017-08-16 DIAGNOSIS — A419 Sepsis, unspecified organism: Principal | ICD-10-CM | POA: Diagnosis present

## 2017-08-16 DIAGNOSIS — I10 Essential (primary) hypertension: Secondary | ICD-10-CM | POA: Diagnosis present

## 2017-08-16 DIAGNOSIS — Z794 Long term (current) use of insulin: Secondary | ICD-10-CM

## 2017-08-16 DIAGNOSIS — N183 Chronic kidney disease, stage 3 unspecified: Secondary | ICD-10-CM | POA: Diagnosis present

## 2017-08-16 DIAGNOSIS — Z823 Family history of stroke: Secondary | ICD-10-CM

## 2017-08-16 DIAGNOSIS — E1122 Type 2 diabetes mellitus with diabetic chronic kidney disease: Secondary | ICD-10-CM | POA: Diagnosis not present

## 2017-08-16 DIAGNOSIS — E1165 Type 2 diabetes mellitus with hyperglycemia: Secondary | ICD-10-CM | POA: Diagnosis present

## 2017-08-16 DIAGNOSIS — E1151 Type 2 diabetes mellitus with diabetic peripheral angiopathy without gangrene: Secondary | ICD-10-CM | POA: Diagnosis not present

## 2017-08-16 DIAGNOSIS — K219 Gastro-esophageal reflux disease without esophagitis: Secondary | ICD-10-CM | POA: Diagnosis present

## 2017-08-16 DIAGNOSIS — R000 Tachycardia, unspecified: Secondary | ICD-10-CM | POA: Diagnosis not present

## 2017-08-16 DIAGNOSIS — K5732 Diverticulitis of large intestine without perforation or abscess without bleeding: Secondary | ICD-10-CM | POA: Diagnosis present

## 2017-08-16 DIAGNOSIS — E1142 Type 2 diabetes mellitus with diabetic polyneuropathy: Secondary | ICD-10-CM | POA: Diagnosis present

## 2017-08-16 DIAGNOSIS — Z7982 Long term (current) use of aspirin: Secondary | ICD-10-CM | POA: Diagnosis not present

## 2017-08-16 DIAGNOSIS — Z952 Presence of prosthetic heart valve: Secondary | ICD-10-CM | POA: Diagnosis not present

## 2017-08-16 DIAGNOSIS — I2510 Atherosclerotic heart disease of native coronary artery without angina pectoris: Secondary | ICD-10-CM | POA: Diagnosis not present

## 2017-08-16 DIAGNOSIS — Z951 Presence of aortocoronary bypass graft: Secondary | ICD-10-CM | POA: Diagnosis not present

## 2017-08-16 DIAGNOSIS — Z8249 Family history of ischemic heart disease and other diseases of the circulatory system: Secondary | ICD-10-CM

## 2017-08-16 DIAGNOSIS — Z9861 Coronary angioplasty status: Secondary | ICD-10-CM | POA: Diagnosis not present

## 2017-08-16 DIAGNOSIS — K5792 Diverticulitis of intestine, part unspecified, without perforation or abscess without bleeding: Secondary | ICD-10-CM | POA: Diagnosis present

## 2017-08-16 DIAGNOSIS — K5720 Diverticulitis of large intestine with perforation and abscess without bleeding: Secondary | ICD-10-CM | POA: Diagnosis not present

## 2017-08-16 DIAGNOSIS — E7800 Pure hypercholesterolemia, unspecified: Secondary | ICD-10-CM | POA: Diagnosis present

## 2017-08-16 DIAGNOSIS — I739 Peripheral vascular disease, unspecified: Secondary | ICD-10-CM | POA: Diagnosis present

## 2017-08-16 DIAGNOSIS — E785 Hyperlipidemia, unspecified: Secondary | ICD-10-CM | POA: Diagnosis not present

## 2017-08-16 DIAGNOSIS — E1121 Type 2 diabetes mellitus with diabetic nephropathy: Secondary | ICD-10-CM | POA: Diagnosis not present

## 2017-08-16 DIAGNOSIS — F17210 Nicotine dependence, cigarettes, uncomplicated: Secondary | ICD-10-CM | POA: Diagnosis present

## 2017-08-16 DIAGNOSIS — R509 Fever, unspecified: Secondary | ICD-10-CM

## 2017-08-16 DIAGNOSIS — G629 Polyneuropathy, unspecified: Secondary | ICD-10-CM

## 2017-08-16 DIAGNOSIS — E119 Type 2 diabetes mellitus without complications: Secondary | ICD-10-CM

## 2017-08-16 HISTORY — DX: Diverticulitis of intestine, part unspecified, without perforation or abscess without bleeding: K57.92

## 2017-08-16 LAB — PROTIME-INR
INR: 1.02
Prothrombin Time: 13.4 seconds (ref 11.4–15.2)

## 2017-08-16 LAB — CBC WITH DIFFERENTIAL/PLATELET
BASOS ABS: 0 10*3/uL (ref 0.0–0.1)
BASOS PCT: 0 %
Eosinophils Absolute: 0 10*3/uL (ref 0.0–0.7)
Eosinophils Relative: 0 %
HEMATOCRIT: 39.6 % (ref 39.0–52.0)
HEMOGLOBIN: 12.7 g/dL — AB (ref 13.0–17.0)
LYMPHS PCT: 2 %
Lymphs Abs: 0.3 10*3/uL — ABNORMAL LOW (ref 0.7–4.0)
MCH: 28.5 pg (ref 26.0–34.0)
MCHC: 32.1 g/dL (ref 30.0–36.0)
MCV: 89 fL (ref 78.0–100.0)
MONOS PCT: 5 %
Monocytes Absolute: 1.1 10*3/uL — ABNORMAL HIGH (ref 0.1–1.0)
NEUTROS ABS: 18.9 10*3/uL — AB (ref 1.7–7.7)
NEUTROS PCT: 93 %
Platelets: 360 10*3/uL (ref 150–400)
RBC: 4.45 MIL/uL (ref 4.22–5.81)
RDW: 15.3 % (ref 11.5–15.5)
WBC: 20.4 10*3/uL — ABNORMAL HIGH (ref 4.0–10.5)

## 2017-08-16 LAB — COMPREHENSIVE METABOLIC PANEL
ALT: 10 U/L — ABNORMAL LOW (ref 17–63)
ANION GAP: 14 (ref 5–15)
AST: 17 U/L (ref 15–41)
Albumin: 3.4 g/dL — ABNORMAL LOW (ref 3.5–5.0)
Alkaline Phosphatase: 77 U/L (ref 38–126)
BILIRUBIN TOTAL: 0.8 mg/dL (ref 0.3–1.2)
BUN: 32 mg/dL — ABNORMAL HIGH (ref 6–20)
CHLORIDE: 101 mmol/L (ref 101–111)
CO2: 17 mmol/L — ABNORMAL LOW (ref 22–32)
Calcium: 8.9 mg/dL (ref 8.9–10.3)
Creatinine, Ser: 1.71 mg/dL — ABNORMAL HIGH (ref 0.61–1.24)
GFR calc Af Amer: 45 mL/min — ABNORMAL LOW (ref >60)
GFR calc non Af Amer: 39 mL/min — ABNORMAL LOW (ref >60)
Glucose, Bld: 321 mg/dL — ABNORMAL HIGH (ref 65–99)
POTASSIUM: 4.8 mmol/L (ref 3.5–5.1)
SODIUM: 132 mmol/L — AB (ref 135–145)
TOTAL PROTEIN: 6.7 g/dL (ref 6.5–8.1)

## 2017-08-16 LAB — I-STAT CG4 LACTIC ACID, ED
Lactic Acid, Venous: 2.53 mmol/L (ref 0.5–1.9)
Lactic Acid, Venous: 1.49 mmol/L (ref 0.5–1.9)

## 2017-08-16 LAB — TROPONIN I

## 2017-08-16 LAB — BRAIN NATRIURETIC PEPTIDE: B Natriuretic Peptide: 47 pg/mL (ref 0.0–100.0)

## 2017-08-16 MED ORDER — ACETAMINOPHEN 325 MG PO TABS
650.0000 mg | ORAL_TABLET | Freq: Once | ORAL | Status: AC
  Administered 2017-08-16: 650 mg via ORAL
  Filled 2017-08-16: qty 2

## 2017-08-16 MED ORDER — SODIUM CHLORIDE 0.9 % IV BOLUS (SEPSIS)
250.0000 mL | Freq: Once | INTRAVENOUS | Status: AC
  Administered 2017-08-16: 250 mL via INTRAVENOUS

## 2017-08-16 MED ORDER — PIPERACILLIN-TAZOBACTAM 3.375 G IVPB 30 MIN
3.3750 g | Freq: Once | INTRAVENOUS | Status: AC
  Administered 2017-08-16: 3.375 g via INTRAVENOUS
  Filled 2017-08-16: qty 50

## 2017-08-16 MED ORDER — SODIUM CHLORIDE 0.9 % IV BOLUS (SEPSIS)
1000.0000 mL | Freq: Once | INTRAVENOUS | Status: AC
  Administered 2017-08-16: 1000 mL via INTRAVENOUS

## 2017-08-16 MED ORDER — VANCOMYCIN HCL IN DEXTROSE 1-5 GM/200ML-% IV SOLN
1000.0000 mg | Freq: Once | INTRAVENOUS | Status: AC
  Administered 2017-08-16: 1000 mg via INTRAVENOUS
  Filled 2017-08-16: qty 200

## 2017-08-16 NOTE — ED Provider Notes (Addendum)
Emergency Department Provider Note   I have reviewed the triage vital signs and the nursing notes.   HISTORY  Chief Complaint Fever   HPI [name] is a 72 y.o. male has had multiple hospitalizations in the last for sepsis of unknown certain etiology.  One time he may have a urinary tract infection but the family is not sure.  Another time he had a positive blood culture was only in 1 tube and it showed E. coli and Enterobacter.  He was recently discharged in the hospital a couple weeks ago and then today he was having some polyuria and hyperglycemia and this evening he had onset of chills and shakes and feeling horrible with nausea.  Temperature rose to 100.702 his blood pressure dropped to the high 90s with a heart rate of 116 to the came here for further evaluation on the way here he had a near syncopal episode.  No cough or other complaints.  Does have an artificial valve.  No pain anywhere.  Review of records it is not.  Had an echocardiogram.  Nor has he had any CT scans to evaluate the same.  No other associated or modifying symptoms.    Past Medical History:  Diagnosis Date  . CAD S/P percutaneous coronary angioplasty 1998   PCI TO CX  . Diabetes mellitus without complication ([place])   . GERD (gastroesophageal reflux disease)   . Headache   . 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 Hypercholesteremia   . Hypertension   . Neuropathy   . Peripheral vascular disease ([place])    s/p R-L FEM-FEM BYPASS  . Tachycardia    after CABG, pt on medicine for this    Patient Active Problem List   Diagnosis Date Noted  . CKD (chronic kidney disease) stage 3, GFR 30-59 ml/min (HCC) 08/02/2017  . Sepsis due to undetermined organism ([place]) 08/01/2017  . GERD (gastroesophageal reflux disease) 08/01/2017  . Bacteremia 06/05/2017  . SIRS (systemic inflammatory response syndrome) ([place]) 05/31/2017  . PAD (peripheral artery disease) ([place]) 08/09/2016  . Claudication ([place]) 05/08/2016  . HCAP  (healthcare-associated pneumonia) 01/12/2016  . Hyponatremia 01/12/2016  . Abnormal chest CT 10/13/2015  . Type 2 diabetes, HbA1c goal < 7% (HCC)   . Unstable angina ([place])   . Hypomagnesemia   . Healthcare-associated pneumonia 10/09/2015  . Type 2 diabetes mellitus ([place]) 10/09/2015  . ASCVD (arteriosclerotic cardiovascular disease) 10/09/2015  . CKD (chronic kidney disease) stage 2, GFR 60-89 ml/min 10/09/2015  . CAP (community acquired pneumonia) 07/09/2015  . Sepsis ([place]) 07/09/2015  . 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 PVD (peripheral vascular disease) ([place]) 07/09/2015  . Essential hypertension 07/09/2015  . Hypotension 07/09/2015  . Neuropathy, peripheral 07/09/2015  . HLD (hyperlipidemia) 07/09/2015  . AKI (acute kidney injury) ([place]) 07/09/2015    Past Surgical History:  Procedure Laterality Date  . ABDOMINAL AORTOGRAM Bilateral 09/19/2016   Procedure: iliac;  Surgeon: Serafina Mitchell, MD;  Location: [facility];  Service: Cardiovascular;  Laterality: Bilateral;  . CARDIAC CATHETERIZATION  2003   with stent  . CARDIAC CATHETERIZATION N/A 10/11/2015   Procedure: Left Heart Cath and Coronary Angiography;  Surgeon: Peter M Martinique, MD;  Location: [facility];  Service: Cardiovascular;  Laterality: N/A;  . COLONOSCOPY N/A 09/22/2013   Procedure: COLONOSCOPY;  Surgeon: Daneil Dolin, MD;  [facility];  Service: Endoscopy;  Laterality: N/A;  9:30 AM  .  CORONARY ARTERY BYPASS GRAFT N/A 10/18/2015   Procedure: CORONARY ARTERY BYPASS GRAFTING (CABG) x  five, using left internal mammary artery and right leg greater saphenous vein harvested endoscopically;  Surgeon: [name], MD;  Location: [facility];  Service: Open Heart Surgery;  Laterality: N/A;  . ENDARTERECTOMY FEMORAL Right 08/09/2016   Procedure: ENDARTERECTOMY FEMORAL WITH VEIN PATCH ANGIOPLASTY;  Surgeon: Serafina Mitchell, MD;  Location: [facility];  Service: Vascular;  Laterality: Right;  .  FEMORAL-FEMORAL BYPASS GRAFT Bilateral 08/09/2016   Procedure: REVISION BYPASS GRAFT RIGHT FEMORAL-LEFT FEMORAL ARTERY;  Surgeon: Serafina Mitchell, MD;  Location: [facility];  Service: Vascular;  Laterality: Bilateral;  . FEMORAL-POPLITEAL BYPASS GRAFT    . PERIPHERAL VASCULAR CATHETERIZATION N/A 05/08/2016   Procedure: Lower Extremity Angiography;  Surgeon: Lorretta Harp, MD;  Location: [facility];  Service: Cardiovascular;  Laterality: N/A;  . PERIPHERAL VASCULAR INTERVENTION Right 09/19/2016   Procedure: Peripheral Vascular Intervention;  Surgeon: Serafina Mitchell, MD;  Location: [facility];  Service: Cardiovascular;  Laterality: Right;  ext iliac stent  . TEE WITHOUT CARDIOVERSION N/A 10/18/2015   Procedure: TRANSESOPHAGEAL ECHOCARDIOGRAM (TEE);  Surgeon: [name], MD;  Location: [facility];  Service: Open Heart Surgery;  Laterality: N/A;    Current Outpatient Rx  . Order #: [id number] Class: Historical Med  . Order #: [id number] Class: Historical Med  . Order #: [id number] Class: Historical Med  . Order #: [id number] Class: Historical Med  . Order #: [id number] Class: Historical Med  . Order #: [id number] Class: No Print  . Order #: [id number] Class: Historical Med  . Order #: [id number] Class: Historical Med    Allergies No known allergies  Family History  Problem Relation Age of Onset  . Heart attack Mother   . Stroke Mother   . Colon cancer Neg Hx     Social History Social History   Tobacco Use  . Smoking status: Former Smoker    Packs/day: 2.00    Years: 30.00    Pack years: 60.00    Types: Cigarettes    Last attempt to quit: 08/14/2000    Years since quitting: 17.0  . Smokeless tobacco: Never Used  Substance Use Topics  . Alcohol use: No  . Drug use: No    Review of Systems  All other systems negative except as documented in the HPI. All pertinent positives and negatives as reviewed in the HPI. ____________________________________________   PHYSICAL  EXAM:  VITAL SIGNS: ED Triage Vitals  Enc Vitals Group     BP 08/16/17 2120 (!) 89/46     Pulse Rate 08/16/17 2120 (!) 109     Resp 08/16/17 2120 (!) 22     Temp 08/16/17 2120 (!) 102.9 F (39.4 C)     Temp Source 08/16/17 2120 Oral     SpO2 08/16/17 2120 93 %    Constitutional: Alert and oriented. Well appearing and in no acute distress. Eyes: Conjunctivae are normal. PERRL. EOMI. Head: Atraumatic. Nose: No congestion/rhinnorhea. Mouth/Throat: Mucous membranes are dry.  Oropharynx non-erythematous. Neck: No stridor.  No meningeal signs.   Cardiovascular: tachycardic rate, regular rhythm. Good peripheral circulation. Grossly normal heart sounds.   Respiratory: Normal respiratory effort.  No retractions. Lungs CTAB. Gastrointestinal: Soft and nontender. No distention.  Musculoskeletal: No lower extremity tenderness nor edema. No gross deformities of extremities. Neurologic:  Normal speech and language. No gross focal neurologic deficits are appreciated.  Skin:  Skin is warm, dry and intact. No rash noted.   ____________________________________________  LABS (all labs ordered are listed, but only abnormal results are displayed)  Labs Reviewed  COMPREHENSIVE METABOLIC PANEL - Abnormal; Notable for the following components:      Result Value   Sodium 132 (*)    CO2 17 (*)    Glucose, Bld 321 (*)    BUN 32 (*)    Creatinine, Ser 1.71 (*)    Albumin 3.4 (*)    ALT 10 (*)    GFR calc non Af Amer 39 (*)    GFR calc Af Amer 45 (*)    All other components within normal limits  CBC WITH DIFFERENTIAL/PLATELET - Abnormal; Notable for the following components:   WBC 20.4 (*)    Hemoglobin 12.7 (*)    Neutro Abs 18.9 (*)    Lymphs Abs 0.3 (*)    Monocytes Absolute 1.1 (*)    All other components within normal limits  URINALYSIS, ROUTINE W REFLEX MICROSCOPIC - Abnormal; Notable for the following components:   Glucose, UA >=500 (*)    Squamous Epithelial / LPF 0-5 (*)    All  other components within normal limits  I-STAT CG4 LACTIC ACID, ED - Abnormal; Notable for the following components:   Lactic Acid, Venous 2.53 (*)    All other components within normal limits  CULTURE, BLOOD (ROUTINE X 2)  CULTURE, BLOOD (ROUTINE X 2)  PROTIME-INR  BRAIN NATRIURETIC PEPTIDE  TROPONIN I  I-STAT CG4 LACTIC ACID, ED  I-STAT CG4 LACTIC ACID, ED   ____________________________________________  EKG   EKG Interpretation  Date/Time:    Ventricular Rate:    PR Interval:    QRS Duration:   QT Interval:    QTC Calculation:   R Axis:     Text Interpretation:         ____________________________________________  RADIOLOGY  Dg Chest Portable 1 View  Result Date: 08/16/2017 CLINICAL DATA:  Initial evaluation for code sepsis. EXAM: PORTABLE CHEST 1 VIEW COMPARISON:  Right radiograph from 08/01/2017. FINDINGS: Median sternotomy wires with underlying CABG markers and surgical clips. Stable cardiomegaly. Mediastinal silhouette normal. Aortic atherosclerosis. Lungs hypoinflated. Mild diffuse pulmonary vascular congestion without frank pulmonary edema. No focal infiltrates. No appreciable pleural effusion. No pneumothorax. No acute osseous abnormality. IMPRESSION: 1. Cardiomegaly with mild diffuse pulmonary vascular congestion without overt pulmonary edema. 2. No other active cardiopulmonary disease. No focal infiltrates identified. Electronically Signed   By: Jeannine Boga M.D.   On: 08/16/2017 22:49    ____________________________________________   PROCEDURES  Procedure(s) performed:   Procedures  CRITICAL CARE Performed by: Merrily Pew Total critical care time: 35 minutes Critical care time was exclusive of separately billable procedures and treating other patients. Critical care was necessary to treat or prevent imminent or life-threatening deterioration. Critical care was time spent personally by me on the following activities: development of treatment  plan with patient and/or surrogate as well as nursing, discussions with consultants, evaluation of patient's response to treatment, examination of patient, obtaining history from patient or surrogate, ordering and performing treatments and interventions, ordering and review of laboratory studies, ordering and review of radiographic studies, pulse oximetry and re-evaluation of patient's condition.  ____________________________________________   INITIAL IMPRESSION / ASSESSMENT AND PLAN / ED COURSE  Patient is once again sepsis with elevated lactic acid and low blood pressure concerning for possible severe sepsis.  Unclear etiology could be urinary.  He has multiple times and may need an echocardiogram to evaluate for his heart valve.  However this is unlikely without positive  blood cultures.  Another thought would be an abscess, discitis.  Low suspicion for a central nervous system etiology of his repeat sepsis episodes.  Will likely need to be admitted to the hospital for these abnormalities.  Consider echocardiogram and possible CT of body to evaluate for occult infections.  Patient's blood pressures still a little bit soft however not received all of his fluids yet so we will continue to give those.  His lactic acid was initially elevated and subsequently improved.  His heart rate is improved.  His mental status is normal.  I think it is unlikely patient has severe sepsis or septic shock at this time.  We will plan for admission for further workup and management.   Pertinent labs & imaging results that were available during my care of the patient were reviewed by me and considered in my medical decision making (see chart for details).  ____________________________________________  FINAL CLINICAL IMPRESSION(S) / ED DIAGNOSES  Final diagnoses:  Fever, unspecified fever cause  Sepsis, due to unspecified organism [facility])     MEDICATIONS GIVEN DURING THIS VISIT:  Medications  sodium chloride 0.9  % bolus 1,000 mL (0 mLs Intravenous Stopped 08/17/17 0005)    And  sodium chloride 0.9 % bolus 1,000 mL (0 mLs Intravenous Stopped 08/17/17 0005)    And  sodium chloride 0.9 % bolus 250 mL (0 mLs Intravenous Stopped 08/17/17 0005)  acetaminophen (TYLENOL) tablet 650 mg (650 mg Oral Given 08/16/17 2149)  vancomycin (VANCOCIN) IVPB 1000 mg/200 mL premix (0 mg Intravenous Stopped 08/16/17 2323)  piperacillin-tazobactam (ZOSYN) IVPB 3.375 g (0 g Intravenous Stopped 08/16/17 2324)  sodium chloride 0.9 % bolus 1,000 mL (1,000 mLs Intravenous New Bag/Given 08/17/17 0024)     Shakisha Abend, Corene Cornea, MD 08/17/17 0111    Ashla Murph, Corene Cornea, MD 09/03/17 [id number]

## 2017-08-16 NOTE — ED Notes (Signed)
Margreta Journey, RN aware of Code Sepsis.

## 2017-08-16 NOTE — ED Triage Notes (Signed)
Pt daughter reports that he has had a temp 102.6 (last tylenol at 1900), frequent urination, low blood blood pressure, and glucose in the 200's. Pt with recent d/c from hospital admission with sepsis

## 2017-08-16 NOTE — ED Notes (Signed)
EDP at bedside  

## 2017-08-17 ENCOUNTER — Inpatient Hospital Stay (HOSPITAL_COMMUNITY): Payer: Medicare Other

## 2017-08-17 ENCOUNTER — Other Ambulatory Visit: Payer: Self-pay

## 2017-08-17 DIAGNOSIS — K5720 Diverticulitis of large intestine with perforation and abscess without bleeding: Secondary | ICD-10-CM | POA: Insufficient documentation

## 2017-08-17 DIAGNOSIS — K5730 Diverticulosis of large intestine without perforation or abscess without bleeding: Secondary | ICD-10-CM | POA: Diagnosis not present

## 2017-08-17 DIAGNOSIS — I10 Essential (primary) hypertension: Secondary | ICD-10-CM | POA: Diagnosis not present

## 2017-08-17 DIAGNOSIS — Z9861 Coronary angioplasty status: Secondary | ICD-10-CM | POA: Diagnosis not present

## 2017-08-17 DIAGNOSIS — N200 Calculus of kidney: Secondary | ICD-10-CM | POA: Diagnosis not present

## 2017-08-17 DIAGNOSIS — E785 Hyperlipidemia, unspecified: Secondary | ICD-10-CM | POA: Diagnosis present

## 2017-08-17 DIAGNOSIS — I1310 Hypertensive heart and chronic kidney disease without heart failure, with stage 1 through stage 4 chronic kidney disease, or unspecified chronic kidney disease: Secondary | ICD-10-CM | POA: Diagnosis present

## 2017-08-17 DIAGNOSIS — Z794 Long term (current) use of insulin: Secondary | ICD-10-CM | POA: Diagnosis not present

## 2017-08-17 DIAGNOSIS — E7800 Pure hypercholesterolemia, unspecified: Secondary | ICD-10-CM | POA: Diagnosis present

## 2017-08-17 DIAGNOSIS — K219 Gastro-esophageal reflux disease without esophagitis: Secondary | ICD-10-CM | POA: Diagnosis present

## 2017-08-17 DIAGNOSIS — A419 Sepsis, unspecified organism: Secondary | ICD-10-CM | POA: Diagnosis not present

## 2017-08-17 DIAGNOSIS — R918 Other nonspecific abnormal finding of lung field: Secondary | ICD-10-CM | POA: Diagnosis not present

## 2017-08-17 DIAGNOSIS — E1142 Type 2 diabetes mellitus with diabetic polyneuropathy: Secondary | ICD-10-CM | POA: Diagnosis present

## 2017-08-17 DIAGNOSIS — R509 Fever, unspecified: Secondary | ICD-10-CM | POA: Diagnosis not present

## 2017-08-17 DIAGNOSIS — Z952 Presence of prosthetic heart valve: Secondary | ICD-10-CM | POA: Diagnosis not present

## 2017-08-17 DIAGNOSIS — Z8249 Family history of ischemic heart disease and other diseases of the circulatory system: Secondary | ICD-10-CM | POA: Diagnosis not present

## 2017-08-17 DIAGNOSIS — E1122 Type 2 diabetes mellitus with diabetic chronic kidney disease: Secondary | ICD-10-CM | POA: Diagnosis present

## 2017-08-17 DIAGNOSIS — K5732 Diverticulitis of large intestine without perforation or abscess without bleeding: Secondary | ICD-10-CM | POA: Diagnosis not present

## 2017-08-17 DIAGNOSIS — E1121 Type 2 diabetes mellitus with diabetic nephropathy: Secondary | ICD-10-CM | POA: Diagnosis present

## 2017-08-17 DIAGNOSIS — F17210 Nicotine dependence, cigarettes, uncomplicated: Secondary | ICD-10-CM | POA: Diagnosis present

## 2017-08-17 DIAGNOSIS — Z7982 Long term (current) use of aspirin: Secondary | ICD-10-CM | POA: Diagnosis not present

## 2017-08-17 DIAGNOSIS — I2510 Atherosclerotic heart disease of native coronary artery without angina pectoris: Secondary | ICD-10-CM | POA: Diagnosis present

## 2017-08-17 DIAGNOSIS — Z951 Presence of aortocoronary bypass graft: Secondary | ICD-10-CM | POA: Diagnosis not present

## 2017-08-17 DIAGNOSIS — Z823 Family history of stroke: Secondary | ICD-10-CM | POA: Diagnosis not present

## 2017-08-17 DIAGNOSIS — N183 Chronic kidney disease, stage 3 (moderate): Secondary | ICD-10-CM | POA: Diagnosis not present

## 2017-08-17 DIAGNOSIS — E1151 Type 2 diabetes mellitus with diabetic peripheral angiopathy without gangrene: Secondary | ICD-10-CM | POA: Diagnosis present

## 2017-08-17 DIAGNOSIS — E1165 Type 2 diabetes mellitus with hyperglycemia: Secondary | ICD-10-CM | POA: Diagnosis present

## 2017-08-17 LAB — URINALYSIS, ROUTINE W REFLEX MICROSCOPIC
Bacteria, UA: NONE SEEN
Bilirubin Urine: NEGATIVE
Glucose, UA: >=500 mg/dL — AB
Hgb urine dipstick: NEGATIVE
Ketones, ur: NEGATIVE mg/dL
Leukocytes, UA: NEGATIVE
Nitrite: NEGATIVE
PROTEIN: NEGATIVE mg/dL
RBC / HPF: NONE SEEN RBC/hpf (ref 0–5)
SPECIFIC GRAVITY, URINE: 1.026 (ref 1.005–1.030)
pH: 5 (ref 5.0–8.0)

## 2017-08-17 LAB — CBG MONITORING, ED
Glucose-Capillary: 144 mg/dL — ABNORMAL HIGH (ref 65–99)
Glucose-Capillary: 78 mg/dL (ref 65–99)
Glucose-Capillary: 141 mg/dL — ABNORMAL HIGH (ref 65–99)
Glucose-Capillary: 74 mg/dL (ref 65–99)

## 2017-08-17 LAB — COMPREHENSIVE METABOLIC PANEL
ALK PHOS: 64 U/L (ref 38–126)
ALT: 8 U/L — AB (ref 17–63)
AST: 11 U/L — AB (ref 15–41)
Albumin: 2.7 g/dL — ABNORMAL LOW (ref 3.5–5.0)
Anion gap: 9 (ref 5–15)
BUN: 26 mg/dL — AB (ref 6–20)
CALCIUM: 7.9 mg/dL — AB (ref 8.9–10.3)
CHLORIDE: 108 mmol/L (ref 101–111)
CO2: 20 mmol/L — AB (ref 22–32)
CREATININE: 1.44 mg/dL — AB (ref 0.61–1.24)
GFR calc Af Amer: 56 mL/min — ABNORMAL LOW (ref >60)
GFR calc non Af Amer: 48 mL/min — ABNORMAL LOW (ref >60)
GLUCOSE: 96 mg/dL (ref 65–99)
Potassium: 3.9 mmol/L (ref 3.5–5.1)
Sodium: 137 mmol/L (ref 135–145)
Total Bilirubin: 0.5 mg/dL (ref 0.3–1.2)
Total Protein: 5.6 g/dL — ABNORMAL LOW (ref 6.5–8.1)

## 2017-08-17 LAB — PROCALCITONIN: Procalcitonin: 0.77 ng/mL

## 2017-08-17 LAB — GLUCOSE, CAPILLARY: Glucose-Capillary: 69 mg/dL (ref 65–99)

## 2017-08-17 MED ORDER — INSULIN GLARGINE 100 UNIT/ML ~~LOC~~ SOLN
20.0000 [IU] | Freq: Every day | SUBCUTANEOUS | Status: DC
  Administered 2017-08-18: 20 [IU] via SUBCUTANEOUS
  Filled 2017-08-17 (×4): qty 0.2

## 2017-08-17 MED ORDER — GABAPENTIN 300 MG PO CAPS
600.0000 mg | ORAL_CAPSULE | Freq: Every day | ORAL | Status: DC
  Administered 2017-08-17 – 2017-08-18 (×2): 600 mg via ORAL
  Filled 2017-08-17 (×2): qty 2

## 2017-08-17 MED ORDER — VITAMIN D 50 MCG (2000 UT) PO TABS: 2000.0000 [IU] | ORAL_TABLET | Freq: Every day | ORAL | Status: DC

## 2017-08-17 MED ORDER — VITAMIN D 1000 UNITS PO TABS
2000.0000 [IU] | ORAL_TABLET | Freq: Every day | ORAL | Status: DC
  Administered 2017-08-18 – 2017-08-19 (×2): 2000 [IU] via ORAL
  Filled 2017-08-17 (×6): qty 2

## 2017-08-17 MED ORDER — VANCOMYCIN HCL IN DEXTROSE 1-5 GM/200ML-% IV SOLN: 1000.0000 mg | Freq: Two times a day (BID) | INTRAVENOUS | Status: DC

## 2017-08-17 MED ORDER — ACETAMINOPHEN 325 MG PO TABS: 650.0000 mg | ORAL_TABLET | Freq: Four times a day (QID) | ORAL | Status: DC | PRN

## 2017-08-17 MED ORDER — ASPIRIN EC 81 MG PO TBEC
81.0000 mg | DELAYED_RELEASE_TABLET | Freq: Every day | ORAL | Status: DC
  Administered 2017-08-18 – 2017-08-19 (×2): 81 mg via ORAL
  Filled 2017-08-17 (×3): qty 1

## 2017-08-17 MED ORDER — PIPERACILLIN-TAZOBACTAM 3.375 G IVPB
3.3750 g | Freq: Three times a day (TID) | INTRAVENOUS | Status: DC
  Administered 2017-08-17 – 2017-08-18 (×6): 3.375 g via INTRAVENOUS
  Filled 2017-08-17 (×7): qty 50

## 2017-08-17 MED ORDER — ENOXAPARIN SODIUM 40 MG/0.4ML ~~LOC~~ SOLN
40.0000 mg | SUBCUTANEOUS | Status: DC
  Administered 2017-08-17 – 2017-08-18 (×2): 40 mg via SUBCUTANEOUS
  Filled 2017-08-17 (×2): qty 0.4

## 2017-08-17 MED ORDER — INSULIN ASPART 100 UNIT/ML ~~LOC~~ SOLN
0.0000 [IU] | Freq: Three times a day (TID) | SUBCUTANEOUS | Status: DC
  Administered 2017-08-18: 2 [IU] via SUBCUTANEOUS
  Administered 2017-08-18 – 2017-08-19 (×2): 3 [IU] via SUBCUTANEOUS

## 2017-08-17 MED ORDER — ACETAMINOPHEN 650 MG RE SUPP: 650.0000 mg | Freq: Four times a day (QID) | RECTAL | Status: DC | PRN

## 2017-08-17 MED ORDER — IOPAMIDOL (ISOVUE-300) INJECTION 61%
75.0000 mL | Freq: Once | INTRAVENOUS | Status: AC | PRN
  Administered 2017-08-17: 75 mL via INTRAVENOUS

## 2017-08-17 MED ORDER — ONDANSETRON HCL 4 MG PO TABS: 4.0000 mg | ORAL_TABLET | Freq: Four times a day (QID) | ORAL | Status: DC | PRN

## 2017-08-17 MED ORDER — SODIUM CHLORIDE 0.9 % IV SOLN
INTRAVENOUS | Status: AC
  Administered 2017-08-17 (×2): via INTRAVENOUS

## 2017-08-17 MED ORDER — CILOSTAZOL 100 MG PO TABS
100.0000 mg | ORAL_TABLET | Freq: Two times a day (BID) | ORAL | Status: DC
  Administered 2017-08-18 – 2017-08-19 (×3): 100 mg via ORAL
  Filled 2017-08-17 (×9): qty 1

## 2017-08-17 MED ORDER — SIMVASTATIN 20 MG PO TABS
20.0000 mg | ORAL_TABLET | Freq: Every day | ORAL | Status: DC
  Administered 2017-08-17 – 2017-08-18 (×2): 20 mg via ORAL
  Filled 2017-08-17 (×3): qty 1

## 2017-08-17 MED ORDER — PIPERACILLIN-TAZOBACTAM 3.375 G IVPB 30 MIN: 3.3750 g | Freq: Four times a day (QID) | INTRAVENOUS | Status: DC

## 2017-08-17 MED ORDER — ONDANSETRON HCL 4 MG/2ML IJ SOLN: 4.0000 mg | Freq: Four times a day (QID) | INTRAMUSCULAR | Status: DC | PRN

## 2017-08-17 MED ORDER — GABAPENTIN 300 MG PO CAPS: 300.0000 mg | ORAL_CAPSULE | Freq: Two times a day (BID) | ORAL | Status: DC

## 2017-08-17 MED ORDER — INSULIN ASPART 100 UNIT/ML ~~LOC~~ SOLN
0.0000 [IU] | Freq: Every day | SUBCUTANEOUS | Status: DC
Start: 2017-08-17 — End: 2017-08-19
  Administered 2017-08-18: 3 [IU] via SUBCUTANEOUS

## 2017-08-17 MED ORDER — VANCOMYCIN HCL IN DEXTROSE 750-5 MG/150ML-% IV SOLN
750.0000 mg | Freq: Two times a day (BID) | INTRAVENOUS | Status: DC
  Administered 2017-08-17 – 2017-08-19 (×4): 750 mg via INTRAVENOUS
  Filled 2017-08-17 (×7): qty 150

## 2017-08-17 MED ORDER — GABAPENTIN 300 MG PO CAPS
300.0000 mg | ORAL_CAPSULE | Freq: Every day | ORAL | Status: DC
  Administered 2017-08-18 – 2017-08-19 (×2): 300 mg via ORAL
  Filled 2017-08-17 (×3): qty 1

## 2017-08-17 MED ORDER — FERROUS SULFATE 325 (65 FE) MG PO TABS
325.0000 mg | ORAL_TABLET | Freq: Every day | ORAL | Status: DC
  Administered 2017-08-18 – 2017-08-19 (×2): 325 mg via ORAL
  Filled 2017-08-17 (×6): qty 1

## 2017-08-17 MED ORDER — PANTOPRAZOLE SODIUM 40 MG PO TBEC
80.0000 mg | DELAYED_RELEASE_TABLET | Freq: Two times a day (BID) | ORAL | Status: DC
  Administered 2017-08-17 – 2017-08-19 (×4): 80 mg via ORAL
  Filled 2017-08-17 (×5): qty 2

## 2017-08-17 MED ORDER — VANCOMYCIN HCL IN DEXTROSE 1-5 GM/200ML-% IV SOLN: 1000.0000 mg | INTRAVENOUS | Status: DC

## 2017-08-17 MED ORDER — SODIUM CHLORIDE 0.9 % IV BOLUS (SEPSIS)
1000.0000 mL | Freq: Once | INTRAVENOUS | Status: AC
  Administered 2017-08-17: 1000 mL via INTRAVENOUS

## 2017-08-17 MED ORDER — ASPIRIN 81 MG PO TABS: 81.0000 mg | ORAL_TABLET | Freq: Every day | ORAL | Status: DC

## 2017-08-17 NOTE — Progress Notes (Signed)
Dr. Anselm Pancoast has reviewed the patient's imaging and does not feel this area near the colon is amendable to drainage.  There is a concern that this area is just a large diverticula, which obvious we would not want to drain.  It does not look like a typical abscess and due to this concern feels it would be unsafe to proceed with any radiologic drainage.  This has been discussed with Dr. Clementeen Graham.  [name] 10:47 AM 08/17/2017

## 2017-08-17 NOTE — H&P (Signed)
History and Physical    AURELIANO OSHIELDS DZH:[id number] DOB: July 02, 1946 DOA: 08/16/2017  PCP: [name], MD   Patient coming from: Home  Chief Complaint: Fever  HPI: [name] is a 72 y.o. male with medical history significant for CAD/CABG, prosthetic heart valve placed in 2016, type 2 diabetes, dyslipidemia, hypertension, peripheral vascular disease, diabetic peripheral neuropathy, and multiple hospitalizations recently for sepsis of unknown etiology, who presented to the emergency department after he suddenly developed a fever at home at approximately 5 PM.  The patient also had some chills and rigors and generally felt unwell and also had some nausea.  His temperature went to 100.45F and he was near syncopal as well.  He denies any upper respiratory symptomatology, myalgias, diarrhea, headache, neck stiffness, photophobia, dyspnea, chest pain, joint swelling/aches, or other concerns.  The patient has previously had pneumonia, UTI, and bacteremia with E. coli and Enterobacter several months ago.  He was actually discharged from the hospital on 08/03/2017 with negative blood cultures noted at that time and therefore did not require antibiotics.  He was actually feeling quite well for the last couple weeks since discharge up until earlier yesterday evening.   ED Course: His vital signs demonstrated borderline hypertension with tachycardia upon arrival and this has been improving with IV fluid administration.  He has been started on vancomycin and Zosyn and is noted to have significant leukocytosis of 20,400 on lab work along with lactic acid level of 2.53.  Urine analysis demonstrates elevated glucose, but does not appear to have any other signs of UTI.  Chest x-ray does not demonstrate any focal infiltrates and there appears to be some mild diffuse pulmonary vascular congestion.  Review of Systems: As per HPI otherwise 10 point review of systems negative.   Past Medical History:  Diagnosis Date    . CAD S/P percutaneous coronary angioplasty 1998   PCI TO CX  . Diabetes mellitus without complication ([place])   . GERD (gastroesophageal reflux disease)   . Headache   . 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 Hypercholesteremia   . Hypertension   . Neuropathy   . Peripheral vascular disease ([place])    s/p R-L FEM-FEM BYPASS  . Tachycardia    after CABG, pt on medicine for this    Past Surgical History:  Procedure Laterality Date  . ABDOMINAL AORTOGRAM Bilateral 09/19/2016   Procedure: iliac;  Surgeon: Serafina Mitchell, MD;  Location: [facility] [facility];  Service: Cardiovascular;  Laterality: Bilateral;  . CARDIAC CATHETERIZATION  2003   with stent  . CARDIAC CATHETERIZATION N/A 10/11/2015   Procedure: Left Heart Cath and Coronary Angiography;  Surgeon: Peter M Martinique, MD;  Location: [facility];  Service: Cardiovascular;  Laterality: N/A;  . COLONOSCOPY N/A 09/22/2013   Procedure: COLONOSCOPY;  Surgeon: Daneil Dolin, MD;  [facility];  Service: Endoscopy;  Laterality: N/A;  9:30 AM  . CORONARY ARTERY BYPASS GRAFT N/A 10/18/2015   Procedure: CORONARY ARTERY BYPASS GRAFTING (CABG) x  five, using left internal mammary artery and right leg greater saphenous vein harvested endoscopically;  Surgeon: [name], MD;  Location: [facility];  Service: Open Heart Surgery;  Laterality: N/A;  . ENDARTERECTOMY FEMORAL Right 08/09/2016   Procedure: ENDARTERECTOMY FEMORAL WITH VEIN PATCH ANGIOPLASTY;  Surgeon: Serafina Mitchell, MD;  Location: [facility];  Service: Vascular;  Laterality: Right;  . FEMORAL-FEMORAL BYPASS GRAFT Bilateral 08/09/2016   Procedure: REVISION BYPASS GRAFT RIGHT  [facility] FEMORAL ARTERY;  Surgeon: Serafina Mitchell, MD;  Location: [facility];  Service: Vascular;  Laterality: Bilateral;  . FEMORAL-POPLITEAL BYPASS GRAFT    . PERIPHERAL VASCULAR CATHETERIZATION N/A 05/08/2016   Procedure: Lower Extremity Angiography;  Surgeon: Lorretta Harp, MD;  Location: [facility] [facility];   Service: Cardiovascular;  Laterality: N/A;  . PERIPHERAL VASCULAR INTERVENTION Right 09/19/2016   Procedure: Peripheral Vascular Intervention;  Surgeon: Serafina Mitchell, MD;  Location: [facility];  Service: Cardiovascular;  Laterality: Right;  ext iliac stent  . TEE WITHOUT CARDIOVERSION N/A 10/18/2015   Procedure: TRANSESOPHAGEAL ECHOCARDIOGRAM (TEE);  Surgeon: [name], MD;  Location: [facility];  Service: Open Heart Surgery;  Laterality: N/A;     reports that he quit smoking about 17 years ago. His smoking use included cigarettes. He has a 60.00 pack-year smoking history. he has never used smokeless tobacco. He reports that he does not drink alcohol or use drugs.  Allergies  Allergen Reactions  . No Known Allergies     Family History  Problem Relation Age of Onset  . Heart attack Mother   . Stroke Mother   . Colon cancer Neg Hx     Prior to Admission medications   Medication Sig Start Date End Date Taking? Authorizing Provider  aspirin 81 MG tablet Take 81 mg by mouth daily.   Yes [provider]  Cholecalciferol (VITAMIN D) 2000 UNITS tablet Take 2,000 Units by mouth daily.   Yes [provider]  cilostazol (PLETAL) 100 MG tablet Take 100 mg by mouth 2 (two) times daily.   Yes [provider]  ferrous sulfate 325 (65 FE) MG tablet Take 325 mg by mouth daily.    Yes [provider]  gabapentin (NEURONTIN) 300 MG capsule Take 300-600 mg by mouth 2 (two) times daily. 300mg  in the morning and 600mg  at bedtime   Yes [provider]  insulin glargine (LANTUS) 100 UNIT/ML injection Inject 0.2 mLs (20 Units total) into the skin at bedtime. 08/03/17  Yes Isaac Bliss, Rayford Halsted, MD  pantoprazole (PROTONIX) 40 MG tablet Take 80 mg by mouth 2 (two) times daily.    Yes [provider]  simvastatin (ZOCOR) 40 MG tablet Take 20 mg by mouth at bedtime.    Yes [provider]    Physical Exam: Vitals:   08/17/17 0000  08/17/17 0005 08/17/17 0030 08/17/17 0100  BP: (!) 79/48 (!) 86/45 (!) 125/40 (!) 91/50  Pulse: 78 78 82 73  Resp: 19 14 20 17   Temp:      TempSrc:      SpO2: 95% 96% (!) 89% 92%    Constitutional: NAD, calm, comfortable Vitals:   08/17/17 0000 08/17/17 0005 08/17/17 0030 08/17/17 0100  BP: (!) 79/48 (!) 86/45 (!) 125/40 (!) 91/50  Pulse: 78 78 82 73  Resp: 19 14 20 17   Temp:      TempSrc:      SpO2: 95% 96% (!) 89% 92%   Eyes: lids and conjunctivae normal ENMT: Mucous membranes are moist.  Neck: normal, supple Respiratory: clear to auscultation bilaterally. Normal respiratory effort. No accessory muscle use.  Cardiovascular: Regular rate and rhythm, no murmurs. No extremity edema. Abdomen: no tenderness, no distention. Bowel sounds positive.  Musculoskeletal:  No joint deformity upper and lower extremities.   Skin: no rashes, lesions, ulcers.  Psychiatric: Normal judgment and insight. Alert and oriented x 3. Normal mood.   Labs on Admission: I have  personally reviewed following labs and imaging studies  CBC: Recent Labs  Lab 08/16/17 2123  WBC 20.4*  NEUTROABS 18.9*  HGB 12.7*  HCT 39.6  MCV 89.0  PLT 578   Basic Metabolic Panel: Recent Labs  Lab 08/16/17 2123  NA 132*  K 4.8  CL 101  CO2 17*  GLUCOSE 321*  BUN 32*  CREATININE 1.71*  CALCIUM 8.9   GFR: CrCl cannot be calculated (Unknown ideal weight.). Liver Function Tests: Recent Labs  Lab 08/16/17 2123  AST 17  ALT 10*  ALKPHOS 77  BILITOT 0.8  PROT 6.7  ALBUMIN 3.4*   No results for input(s): LIPASE, AMYLASE in the last 168 hours. No results for input(s): AMMONIA in the last 168 hours. Coagulation Profile: Recent Labs  Lab 08/16/17 2123  INR 1.02   Cardiac Enzymes: Recent Labs  Lab 08/16/17 2123  TROPONINI <0.03   BNP (last 3 results) No results for input(s): PROBNP in the last 8760 hours. HbA1C: No results for input(s): HGBA1C in the last 72 hours. CBG: No results for  input(s): GLUCAP in the last 168 hours. Lipid Profile: No results for input(s): CHOL, HDL, LDLCALC, TRIG, CHOLHDL, LDLDIRECT in the last 72 hours. Thyroid Function Tests: No results for input(s): TSH, T4TOTAL, FREET4, T3FREE, THYROIDAB in the last 72 hours. Anemia Panel: No results for input(s): VITAMINB12, FOLATE, FERRITIN, TIBC, IRON, RETICCTPCT in the last 72 hours. Urine analysis:    Component Value Date/Time   COLORURINE YELLOW 08/16/2017 2358   APPEARANCEUR CLEAR 08/16/2017 2358   LABSPEC 1.026 08/16/2017 2358   PHURINE 5.0 08/16/2017 2358   GLUCOSEU >=500 (A) 08/16/2017 2358   HGBUR NEGATIVE 08/16/2017 2358   BILIRUBINUR NEGATIVE 08/16/2017 2358   KETONESUR NEGATIVE 08/16/2017 2358   PROTEINUR NEGATIVE 08/16/2017 2358   UROBILINOGEN 0.2 03/16/2015 0120   NITRITE NEGATIVE 08/16/2017 2358   LEUKOCYTESUR NEGATIVE 08/16/2017 2358    Radiological Exams on Admission: Dg Chest Portable 1 View  Result Date: 08/16/2017 CLINICAL DATA:  Initial evaluation for code sepsis. EXAM: PORTABLE CHEST 1 VIEW COMPARISON:  Right radiograph from 08/01/2017. FINDINGS: Median sternotomy wires with underlying CABG markers and surgical clips. Stable cardiomegaly. Mediastinal silhouette normal. Aortic atherosclerosis. Lungs hypoinflated. Mild diffuse pulmonary vascular congestion without frank pulmonary edema. No focal infiltrates. No appreciable pleural effusion. No pneumothorax. No acute osseous abnormality. IMPRESSION: 1. Cardiomegaly with mild diffuse pulmonary vascular congestion without overt pulmonary edema. 2. No other active cardiopulmonary disease. No focal infiltrates identified. Electronically Signed   By: Jeannine Boga M.D.   On: 08/16/2017 22:49     Assessment/Plan Principal Problem:   Sepsis ([place]) Active Problems:   Essential hypertension   Neuropathy, peripheral   HLD (hyperlipidemia)   Type 2 diabetes mellitus (HCC)   PAD (peripheral artery disease) (HCC)   CKD (chronic  kidney disease) stage 3, GFR 30-59 ml/min (HCC)    Sepsis of unknown etiology -Recurrent fevers appears to be infectious in nature; does not appear to be in CNS Continue broad-spectrum coverage with Zosyn and vancomycin Blood cultures in ED Tylenol for fever Continue IV fluid Repeat lactic acid reflex as ordered CT scan of head, neck, chest, abdomen/pelvis to rule out occult abscess Respiratory panel Consider TEE if no other findings noted on account of prosthetic heart valve Monitor in stepdown unit due to borderline hypotension  Type 2 diabetes with hyperglycemia Continue home Lantus Sliding scale insulin Carb modified diet  CKD stage III Creatinine appears to be at baseline of about 1.7 Continue IV fluid  Monitor with repeat labs  DVT prophylaxis: Lovenox Code Status: Full Family Communication: Wife Disposition Plan:Home in few days Consults called:None Admission status: Inpatient; SDU   Deina Lipsey D [facility] Pager [phone]  If 7PM-7AM, please contact night-coverage [email or web address] Password [email or web address]  08/17/2017, 1:36 AM

## 2017-08-17 NOTE — ED Notes (Signed)
Dr. Manuella Ghazi paged, notified of BP and bolus started, verbal order for 855ml NS bolus

## 2017-08-17 NOTE — ED Notes (Signed)
Pt given meal tray containing pancakes, sausage, coffee and milk

## 2017-08-17 NOTE — Progress Notes (Signed)
Pharmacy Antibiotic Note  [name] is a 72 y.o. male admitted on 08/16/2017 with sepsis.  Pharmacy has been consulted for vancomycin and zosyn dosing.   PMH significant for CAD/CABG, prosthetic heart valve 2016, DM2, dyslipidemia, HTN, PVD, peripheral neuropathy, and recently multiple hospitalizations for sepsis of unknown etiology.  Now with new fever and nausea. Also noted to have stage III CKD.  SrCr today  1.44. Vancomycin 1 G IV x 1 given at 2218 Zosyn 3.375 G IV x 1 over 30 min given at 2218  Plan: -Continue  Zosyn 3.375G IV q8h -Change  Vancomycin 750 mg IV q12 hours - Follow up SCr, UOP, cultures, clinical course and adjust as clinically indicated     Temp (24hrs), Avg:100.4 F (38 C), Min:97.5 F (36.4 C), Max:102.9 F (39.4 C)  Recent Labs  Lab 08/16/17 2123 08/16/17 2149 08/16/17 2334 08/17/17 0500  WBC 20.4*  --   --   --   CREATININE 1.71*  --   --  1.44*  LATICACIDVEN  --  2.53* 1.49  --     CrCl cannot be calculated (Unknown ideal weight.).    Allergies  Allergen Reactions  . No Known Allergies     Antimicrobials this admission: Zosyn  08/16/17 >>  Vancomcyin 08/16/17 >>   Dose adjustments this admission:   Microbiology results:  08/16/17 BCx x 2: pending  UCx:    Sputum:    MRSA PCR:   Thank you for allowing pharmacy to be a part of this patient's care.  Excell Seltzer Poteet 08/17/2017 10:18 AM

## 2017-08-17 NOTE — Progress Notes (Signed)
PROGRESS NOTE                                                                                                                                                                                                             Patient Demographics:    [name], is a 72 y.o. male, DOB - 06/28/1946, JYN:[id number]  Admit date - 08/16/2017   Admitting Physician No admitting provider for patient encounter.  Outpatient Primary MD for the patient is [name], MD  LOS - 0  Outpatient Specialists:   Chief Complaint  Patient presents with  . Fever       Brief Narrative   72 year old male with coronary artery disease/CABG, prosthetic heart valve placed in 2016, type 2 diabetes mellitus, hypertension or peripheral vascular disease, type 2 diabetes mellitus with peripheral neuropathy and multiple hospitalization recently for sepsis of unclear etiology presented again with fevers or chills associated with some nausea and near syncopal episode for past few days. Patient previously hospitalized with pneumonia, UTI, bacteremia with Escherichia coli and Enterobacter and recent hospitalization 2 weeks back with sepsis of unclear etiology. The ED he was septic with significant leukocytosis and elevated lactic acid. CT of the abdomen and pelvis showing sigmoid diverticulosis with 6 cm collection suggestive of possible localized perforation of diverticulitis with a diverticular abscess. Patient placed on broad-spectrum antibiotics and IR and general surgery consulted.    Subjective:   Patient denies any abdominal pain, nausea, vomiting or diarrhea. Reports feeling weak  Assessment  & Plan :    Principal Problem:   Sepsis ([place]) Likely secondary to sigmoid diverticulitis with possible perforation and abscess. Patient made nothing by mouth. Sepsis pathway initiated in the ED. Subsequent  lactate has normalized. Continue IV hydration, empiric  vancomycin and Zosyn. Blood culture sent from the ED. General surgery consulted. IR consulted for drainage. (IR at [facility] been consulted and will follow up with the recommendation. Patient will need to be transferred there for drainage) CT scan of the head and neck and chest negative for occult abscess.  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  Coronary artery disease with history of CABG Peripheral artery disease Continue aspirin, Pletal and statin.  Diabetic neuropathy Continue gabapentin.     CKD (chronic kidney disease) stage  3, GFR 30-59 ml/min (HCC) Renal function at baseline. Continue to monitor.        Code Status : Full code  Family Communication  : Wife at bedside  Disposition Plan  : Pending hospital course  Barriers For Discharge : Active symptoms  Consults  :   IR Gen. surgery  Procedures  :  CT head, neck, chest, abdomen and pelvis  DVT Prophylaxis  :  Lovenox -   Lab Results  Component Value Date   PLT 360 08/16/2017    Antibiotics  :    Anti-infectives (From admission, onward)   Start     Dose/Rate Route Frequency Ordered Stop   08/17/17 2200  vancomycin (VANCOCIN) IVPB 1000 mg/200 mL premix  Status:  Discontinued     1,000 mg 200 mL/hr over 60 Minutes Intravenous Every 24 hours 08/17/17 0334 08/17/17 1022   08/17/17 1100  vancomycin (VANCOCIN) IVPB 750 mg/150 ml premix     750 mg 150 mL/hr over 60 Minutes Intravenous Every 12 hours 08/17/17 1022     08/17/17 0600  piperacillin-tazobactam (ZOSYN) IVPB 3.375 g     3.375 g 12.5 mL/hr over 240 Minutes Intravenous Every 8 hours 08/17/17 0334     08/17/17 0145  vancomycin (VANCOCIN) IVPB 1000 mg/200 mL premix  Status:  Discontinued     1,000 mg 200 mL/hr over 60 Minutes Intravenous Every 12 hours 08/17/17 0134 08/17/17 0155   08/17/17 0133  piperacillin-tazobactam (ZOSYN) IVPB 3.375 g  Status:   Discontinued     3.375 g 100 mL/hr over 30 Minutes Intravenous Every 6 hours 08/17/17 0134 08/17/17 0141   08/16/17 2200  vancomycin (VANCOCIN) IVPB 1000 mg/200 mL premix     1,000 mg 200 mL/hr over 60 Minutes Intravenous  Once 08/16/17 2158 08/16/17 2323   08/16/17 2200  piperacillin-tazobactam (ZOSYN) IVPB 3.375 g     3.375 g 100 mL/hr over 30 Minutes Intravenous  Once 08/16/17 2158 08/16/17 2324        Objective:   Vitals:   08/17/17 0901 08/17/17 0930 08/17/17 0945 08/17/17 1000  BP:  (!) 128/43  (!) 128/44  Pulse:  71 70 65  Resp:    10  Temp: (!) 97.5 F (36.4 C)     TempSrc: Oral     SpO2:  92% 98% 100%    Wt Readings from Last 3 Encounters:  08/01/17 72.7 kg (160 lb 4.4 oz)  06/05/17 71.4 kg (157 lb 6.5 oz)  06/02/17 72.7 kg (160 lb 4.4 oz)     Intake/Output Summary (Last 24 hours) at 08/17/2017 1023 Last data filed at 08/17/2017 1000 Gross per 24 hour  Intake 4500 ml  Output 1050 ml  Net 3450 ml     Physical Exam  Gen: not in distress, fatigue HEENT: no pallor, moist mucosa, supple neck Chest: clear b/l, no added sounds CVS: N S1&S2, no murmurs, rubs or gallop GI: soft, NT, ND, BS+ Musculoskeletal: warm, no edema     Data Review:    CBC Recent Labs  Lab 08/16/17 2123  WBC 20.4*  HGB 12.7*  HCT 39.6  PLT 360  MCV 89.0  MCH 28.5  MCHC 32.1  RDW 15.3  LYMPHSABS 0.3*  MONOABS 1.1*  EOSABS 0.0  BASOSABS 0.0    Chemistries  Recent Labs  Lab 08/16/17 2123 08/17/17 0500  NA 132* 137  K 4.8 3.9  CL 101 108  CO2 17* 20*  GLUCOSE 321* 96  BUN 32* 26*  CREATININE 1.71*  1.44*  CALCIUM 8.9 7.9*  AST 17 11*  ALT 10* 8*  ALKPHOS 77 64  BILITOT 0.8 0.5   ------------------------------------------------------------------------------------------------------------------ No results for input(s): CHOL, HDL, LDLCALC, TRIG, CHOLHDL, LDLDIRECT in the last 72 hours.  Lab Results  Component Value Date   HGBA1C 9.8 (H) 05/31/2017    ------------------------------------------------------------------------------------------------------------------ No results for input(s): TSH, T4TOTAL, T3FREE, THYROIDAB in the last 72 hours.  Invalid input(s): FREET3 ------------------------------------------------------------------------------------------------------------------ No results for input(s): VITAMINB12, FOLATE, FERRITIN, TIBC, IRON, RETICCTPCT in the last 72 hours.  Coagulation profile Recent Labs  Lab 08/16/17 2123  INR 1.02    No results for input(s): DDIMER in the last 72 hours.  Cardiac Enzymes Recent Labs  Lab 08/16/17 2123  TROPONINI <0.03   ------------------------------------------------------------------------------------------------------------------    Component Value Date/Time   BNP 47.0 08/16/2017 2123    Inpatient Medications  Scheduled Meds: . aspirin EC  81 mg Oral Daily  . cholecalciferol  2,000 Units Oral Daily  . cilostazol  100 mg Oral BID  . enoxaparin (LOVENOX) injection  40 mg Subcutaneous Q24H  . ferrous sulfate  325 mg Oral Daily  . gabapentin  300 mg Oral Daily  . gabapentin  600 mg Oral QHS  . insulin aspart  0-15 Units Subcutaneous TID WC  . insulin aspart  0-5 Units Subcutaneous QHS  . insulin glargine  20 Units Subcutaneous QHS  . pantoprazole  80 mg Oral BID  . simvastatin  20 mg Oral QHS   Continuous Infusions: . piperacillin-tazobactam (ZOSYN)  IV 3.375 g (08/17/17 0636)  . vancomycin     PRN Meds:.acetaminophen **OR** acetaminophen, ondansetron **OR** ondansetron (ZOFRAN) IV  Micro Results Recent Results (from the past 240 hour(s))  Culture, blood (Routine x 2)     Status: None (Preliminary result)   Collection Time: 08/16/17  9:23 PM  Result Value Ref Range Status   Specimen Description BLOOD RIGHT FOREARM  Final   Special Requests   Final    BOTTLES DRAWN AEROBIC AND ANAEROBIC Blood Culture adequate volume   Culture NO GROWTH < 12 HOURS  Final   Report  Status PENDING  Incomplete  Culture, blood (Routine x 2)     Status: None (Preliminary result)   Collection Time: 08/16/17 10:05 PM  Result Value Ref Range Status   Specimen Description BLOOD LEFT ARM  Final   Special Requests   Final    BOTTLES DRAWN AEROBIC AND ANAEROBIC Blood Culture adequate volume   Culture NO GROWTH < 12 HOURS  Final   Report Status PENDING  Incomplete    Radiology Reports Dg Chest 2 View  Result Date: 08/01/2017 CLINICAL DATA:  Lethargy, fever and weakness today. History of hypertension, CABG. EXAM: CHEST  2 VIEW COMPARISON:  Chest radiograph May 31, 2017 FINDINGS: Cardiac silhouette is at least moderately enlarged. Calcified aortic knob. Mild bronchitic changes. Status post median sternotomy for CABG. Mildly elevated RIGHT hemidiaphragm with bibasilar strandy densities. No pleural effusion or focal consolidation. No pneumothorax. IMPRESSION: Stable cardiomegaly. Mild bronchitic changes. Bibasilar atelectasis. Electronically Signed   By: Elon Alas M.D.   On: 08/01/2017 17:39   Ct Head Wo Contrast  Result Date: 08/17/2017 CLINICAL DATA:  Altered mental status.  Fever of unknown origin. EXAM: CT HEAD WITHOUT CONTRAST TECHNIQUE: Contiguous axial images were obtained from the base of the skull through the vertex without intravenous contrast. COMPARISON:  Head CT 05/31/2017 FINDINGS: Brain: Mild generalized atrophy, unchanged from prior exam. No intracranial hemorrhage, mass effect, or midline shift. No hydrocephalus.  Cavum septum pellucidum, normal variant. The basilar cisterns are patent. No evidence of territorial infarct or acute ischemia. No extra-axial or intracranial fluid collection. Vascular: Atherosclerosis of skullbase vasculature without hyperdense vessel or abnormal calcification. Skull: Normal. Negative for fracture or focal lesion. Sinuses/Orbits: Paranasal sinuses and mastoid air cells are clear. The visualized orbits are unremarkable. Other: None.  IMPRESSION: 1.  No acute intracranial abnormality. 2. Stable generalized atrophy. Electronically Signed   By: Jeb Levering M.D.   On: 08/17/2017 02:48   Ct Soft Tissue Neck W Contrast  Result Date: 08/17/2017 CLINICAL DATA:  Initial evaluation for acute fever of unknown origin. EXAM: CT NECK WITH CONTRAST TECHNIQUE: Multidetector CT imaging of the neck was performed using the standard protocol following the bolus administration of intravenous contrast. CONTRAST:  40mL ISOVUE-300 IOPAMIDOL (ISOVUE-300) INJECTION 61% COMPARISON:  None. FINDINGS: Pharynx and larynx: Study limited by motion artifact. Oral cavity within normal limits without mass lesion or loculated fluid collection. No acute abnormality about the dentition. Palatine tonsils symmetric and within normal limits. No tonsillar or peritonsillar collection. Parapharyngeal fat preserved. Nasopharynx within normal limits. No appreciable retropharyngeal collection. Epiglottis grossly normal. Vallecula largely effaced by the lingual tonsils. Remainder of the hypopharynx and supraglottic larynx within normal limits. True cords symmetric and normal. Subglottic airway clear. Salivary glands: Salivary glands demonstrate no acute abnormality. Thyroid: Thyroid normal. Lymph nodes: No adenopathy within the neck. Vascular: Extensive atherosclerosis present about the aortic arch and carotid bifurcations. Normal intravascular enhancement seen throughout the neck. Limited intracranial: Visualized brain within normal limits. Visualized orbits: Visualized globes and orbital soft tissues unremarkable. Mastoids and visualized paranasal sinuses: Partially visualized paranasal sinuses are clear. Partially visualize mastoids and middle ear cavities are clear. Skeleton: No acute osseus abnormality. No worrisome lytic or blastic osseous lesions. Median sternotomy wires noted. Upper chest: Visualized upper chest demonstrates no acute abnormality. Mild scattered atelectatic  changes noted within the visualized lungs. Partially visualized lungs are otherwise clear. Other: None. IMPRESSION: 1. No CT evidence for acute abnormality within the neck. No acute inflammatory changes or other finding to explain patient's symptoms. 2. Advanced atherosclerosis. Electronically Signed   By: Jeannine Boga M.D.   On: 08/17/2017 02:46   Ct Chest W Contrast  Result Date: 08/17/2017 CLINICAL DATA:  Fever of unknown origin. EXAM: CT CHEST, ABDOMEN, AND PELVIS WITH CONTRAST TECHNIQUE: Multidetector CT imaging of the chest, abdomen and pelvis was performed following the standard protocol during bolus administration of intravenous contrast. CONTRAST:  7mL ISOVUE-300 IOPAMIDOL (ISOVUE-300) INJECTION 61% COMPARISON:  CT chest 01/19/2017.  CT abdomen and pelvis 02/02/2005 FINDINGS: CT CHEST FINDINGS Cardiovascular: Normal heart size. No pericardial effusion. Normal caliber thoracic aorta. Aortic calcifications. Postoperative changes consistent with coronary bypass. Mediastinum/Nodes: No significant lymphadenopathy in the chest. Esophagus is decompressed. Thyroid gland is unremarkable. Lungs/Pleura: Mild dependent changes in the lungs. Previous lung infiltrates are improving. No focal consolidation. No pleural effusions. No pneumothorax. Airways are patent. Musculoskeletal: Postoperative median sternotomy. No destructive bone lesions. CT ABDOMEN PELVIS FINDINGS Hepatobiliary: No focal liver abnormality is seen. No gallstones, gallbladder wall thickening, or biliary dilatation. Pancreas: Unremarkable. No pancreatic ductal dilatation or surrounding inflammatory changes. Spleen: Normal in size without focal abnormality. Adrenals/Urinary Tract: No adrenal gland nodules. Small stones in the upper poles of both kidneys. Bilateral renal cysts. Prominent vascular calcification along the renal arteries and intrarenal arteries. Calcification at the origin of the renal artery's may result in renal artery  stenosis. Nephrograms are symmetrical. No hydronephrosis or hydroureter. Bladder is distended, possibly  indicating urine retention. No bladder wall thickening. Stomach/Bowel: Diverticulosis of the sigmoid colon. There is a large pericolonic collection adjacent to the sigmoid region in the left lower quadrant. The collection has a thickened wall and contains stool and gas. This collection measures about 5.9 cm in diameter. There is mild inflammatory stranding in the fat around the collection. There is direct communication with the colon. This most likely represents localized perforation of diverticulitis with a diverticular abscess. A perforated colon cancer could also have this appearance and follow-up imaging after resolution of the acute process is recommended to exclude underlying colon cancer. Scattered stool throughout the remainder the colon. No evidence of colonic or small bowel obstruction. Stomach is decompressed. Appendix is normal. Vascular/Lymphatic: Extensive calcification of the aorta and branch vessels. Prominent calcifications may result in stenosis of the celiac axis, superior mesenteric artery, inferior mesenteric artery, and common iliac and external iliac arteries bilaterally. Poor contrast bolus limits evaluation of the arterial flow. There is a femoral femoral bypass graft. Reproductive: Prostate gland is enlarged. Other: No free air or free fluid in the abdomen. Musculoskeletal: No acute or significant osseous findings. IMPRESSION: 1. No evidence of active pulmonary disease. 2. Gas and stool-filled thick walled collection in the left lower quadrant measuring 5.9 cm diameter and communicating with sigmoid colon. This is consistent with an abscess, likely indicating diverticulitis with diverticular abscess. A less likely consideration would be a perforated colon cancer. Colonic evaluation is recommended after resolution of the acute process to exclude an underlying colon neoplasm. 3. Aortic  atherosclerosis and extensive calcific atherosclerotic changes throughout the abdominal vasculature. Probable calcific stenosis at multiple vessels. Electronically Signed   By: Lucienne Capers M.D.   On: 08/17/2017 03:02   Ct Abdomen Pelvis W Contrast  Result Date: 08/17/2017 CLINICAL DATA:  Fever of unknown origin. EXAM: CT CHEST, ABDOMEN, AND PELVIS WITH CONTRAST TECHNIQUE: Multidetector CT imaging of the chest, abdomen and pelvis was performed following the standard protocol during bolus administration of intravenous contrast. CONTRAST:  60mL ISOVUE-300 IOPAMIDOL (ISOVUE-300) INJECTION 61% COMPARISON:  CT chest 01/19/2017.  CT abdomen and pelvis 02/02/2005 FINDINGS: CT CHEST FINDINGS Cardiovascular: Normal heart size. No pericardial effusion. Normal caliber thoracic aorta. Aortic calcifications. Postoperative changes consistent with coronary bypass. Mediastinum/Nodes: No significant lymphadenopathy in the chest. Esophagus is decompressed. Thyroid gland is unremarkable. Lungs/Pleura: Mild dependent changes in the lungs. Previous lung infiltrates are improving. No focal consolidation. No pleural effusions. No pneumothorax. Airways are patent. Musculoskeletal: Postoperative median sternotomy. No destructive bone lesions. CT ABDOMEN PELVIS FINDINGS Hepatobiliary: No focal liver abnormality is seen. No gallstones, gallbladder wall thickening, or biliary dilatation. Pancreas: Unremarkable. No pancreatic ductal dilatation or surrounding inflammatory changes. Spleen: Normal in size without focal abnormality. Adrenals/Urinary Tract: No adrenal gland nodules. Small stones in the upper poles of both kidneys. Bilateral renal cysts. Prominent vascular calcification along the renal arteries and intrarenal arteries. Calcification at the origin of the renal artery's may result in renal artery stenosis. Nephrograms are symmetrical. No hydronephrosis or hydroureter. Bladder is distended, possibly indicating urine retention. No  bladder wall thickening. Stomach/Bowel: Diverticulosis of the sigmoid colon. There is a large pericolonic collection adjacent to the sigmoid region in the left lower quadrant. The collection has a thickened wall and contains stool and gas. This collection measures about 5.9 cm in diameter. There is mild inflammatory stranding in the fat around the collection. There is direct communication with the colon. This most likely represents localized perforation of diverticulitis with a diverticular abscess. A perforated  colon cancer could also have this appearance and follow-up imaging after resolution of the acute process is recommended to exclude underlying colon cancer. Scattered stool throughout the remainder the colon. No evidence of colonic or small bowel obstruction. Stomach is decompressed. Appendix is normal. Vascular/Lymphatic: Extensive calcification of the aorta and branch vessels. Prominent calcifications may result in stenosis of the celiac axis, superior mesenteric artery, inferior mesenteric artery, and common iliac and external iliac arteries bilaterally. Poor contrast bolus limits evaluation of the arterial flow. There is a femoral femoral bypass graft. Reproductive: Prostate gland is enlarged. Other: No free air or free fluid in the abdomen. Musculoskeletal: No acute or significant osseous findings. IMPRESSION: 1. No evidence of active pulmonary disease. 2. Gas and stool-filled thick walled collection in the left lower quadrant measuring 5.9 cm diameter and communicating with sigmoid colon. This is consistent with an abscess, likely indicating diverticulitis with diverticular abscess. A less likely consideration would be a perforated colon cancer. Colonic evaluation is recommended after resolution of the acute process to exclude an underlying colon neoplasm. 3. Aortic atherosclerosis and extensive calcific atherosclerotic changes throughout the abdominal vasculature. Probable calcific stenosis at multiple  vessels. Electronically Signed   By: Lucienne Capers M.D.   On: 08/17/2017 03:02   Dg Chest Portable 1 View  Result Date: 08/16/2017 CLINICAL DATA:  Initial evaluation for code sepsis. EXAM: PORTABLE CHEST 1 VIEW COMPARISON:  Right radiograph from 08/01/2017. FINDINGS: Median sternotomy wires with underlying CABG markers and surgical clips. Stable cardiomegaly. Mediastinal silhouette normal. Aortic atherosclerosis. Lungs hypoinflated. Mild diffuse pulmonary vascular congestion without frank pulmonary edema. No focal infiltrates. No appreciable pleural effusion. No pneumothorax. No acute osseous abnormality. IMPRESSION: 1. Cardiomegaly with mild diffuse pulmonary vascular congestion without overt pulmonary edema. 2. No other active cardiopulmonary disease. No focal infiltrates identified. Electronically Signed   By: Jeannine Boga M.D.   On: 08/16/2017 22:49   US Abdomen Limited Ruq  Result Date: 08/02/2017 CLINICAL DATA:  Fever and bacteremia of unknown source. EXAM: ULTRASOUND ABDOMEN LIMITED RIGHT UPPER QUADRANT COMPARISON:  No prior ultrasound. PET-CT 11/30/2015 and CT abdomen 02/02/2005. FINDINGS: Gallbladder: No shadowing gallstones or echogenic sludge. Approximate 4 mm polyp arising from the anterior wall of the gallbladder body. No gallbladder wall thickening or pericholecystic fluid. Negative sonographic Murphy's sign according to the ultrasound technologist. Common bile duct: Diameter: Approximately 5 mm centrally. The distal duct is obscured by duodenal bowel gas. Liver: Normal size and echotexture without focal parenchymal abnormality. Portal vein is patent on color Doppler imaging with normal direction of blood flow towards the liver. IMPRESSION: 1. No evidence of cholelithiasis or cholecystitis. No biliary ductal dilation. 2. Benign 4 mm polyp involving the anterior wall of the body of the gallbladder. 3. Normal appearing liver. Electronically Signed   By: Evangeline Dakin M.D.   On:  08/02/2017 15:15    Time Spent in minutes  20  Hevin Jeffcoat M.D on 08/17/2017 at 10:23 AM  Between 7am to 7pm - Pager - [phone]  After 7pm go to [email or web address] - password [facility]  [facility] -  Office  [phone]

## 2017-08-17 NOTE — ED Notes (Signed)
Dr Clementeen Graham in to see pt.  Made aware that pt is NPO.  Pt stopped eating breakfast tray and am meds held.

## 2017-08-17 NOTE — Consult Note (Signed)
[facility] Consult  Reason for Consult: Diverticulitis/ abscess   Referring Physician: Dr. Clementeen Graham   Chief Complaint    Fever      [name] is a 72 y.o. male.  HPI: [name] is a 72 yo who has had fevers or unknown origin for the past few weeks. He reports having intermittent fevers and chills at home, and that the fevers have been as high as 103. He was admitted to the hospital 12/19 and discharged on 12/21 with rigors and fever and had a work up that included UA that was negative, CXR that was negative, and [place] that was negative. He did have a leukocytosis and was treated with antibiotics which improved and was not sent home on antibiotics. Prior to this admission, he as admitted 05/2017 with bacteremia from E coli of unknown origin, and was treated with antibiotics at that time. He has never complained of any pain and the documentation from the H&P prior demonstrate no tenderness on exam.  He was pan scanned in the ED when he presented with fevers and a leukocytosis to 20, and there are findings concerning for diverticulitis with possible abscess.  He reports having diverticulitis in the past and requiring antibiotics.   He had a colonoscopy that was negative except for diverticulosis in 2015 with Dr. Gala Romney and is not due until 2025.  In 2017, he underwent a 5 vessel CABG for unstable angina, and since that time reports he has been feeling well and not having any SOB or chest pain.  Past Medical History:  Diagnosis Date  . CAD S/P percutaneous coronary angioplasty 1998   PCI TO CX  . Diabetes mellitus without complication ([place])   . GERD (gastroesophageal reflux disease)   . Headache   . 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 Hypercholesteremia   . Hypertension   . Neuropathy   . Peripheral vascular disease ([place])    s/p R-L FEM-FEM BYPASS  . Tachycardia    after CABG, pt on medicine for this    Past Surgical History:  Procedure Laterality Date  . ABDOMINAL  AORTOGRAM Bilateral 09/19/2016   Procedure: iliac;  Surgeon: Serafina Mitchell, MD;  Location: [place] [facility];  Service: Cardiovascular;  Laterality: Bilateral;  . CARDIAC CATHETERIZATION  2003   with stent  . CARDIAC CATHETERIZATION N/A 10/11/2015   Procedure: Left Heart Cath and Coronary Angiography;  Surgeon: Peter M Martinique, MD;  Location: [place] [facility];  Service: Cardiovascular;  Laterality: N/A;  . COLONOSCOPY N/A 09/22/2013   Procedure: COLONOSCOPY;  Surgeon: Daneil Dolin, MD;  [facility];  Service: Endoscopy;  Laterality: N/A;  9:30 AM  . CORONARY ARTERY BYPASS GRAFT N/A 10/18/2015   Procedure: CORONARY ARTERY BYPASS GRAFTING (CABG) x  five, using left internal mammary artery and right leg greater saphenous vein harvested endoscopically;  Surgeon: [name], MD;  Location: [place];  Service: Open Heart Surgery;  Laterality: N/A;  . ENDARTERECTOMY FEMORAL Right 08/09/2016   Procedure: ENDARTERECTOMY FEMORAL WITH VEIN PATCH ANGIOPLASTY;  Surgeon: Serafina Mitchell, MD;  Location: [place];  Service: Vascular;  Laterality: Right;  . FEMORAL-FEMORAL BYPASS GRAFT Bilateral 08/09/2016   Procedure: REVISION BYPASS GRAFT RIGHT FEMORAL-LEFT FEMORAL ARTERY;  Surgeon: Serafina Mitchell, MD;  Location: [place];  Service: Vascular;  Laterality: Bilateral;  . FEMORAL-POPLITEAL BYPASS GRAFT    . PERIPHERAL VASCULAR CATHETERIZATION N/A 05/08/2016   Procedure: Lower Extremity Angiography;  Surgeon:  Lorretta Harp, MD;  Location: [place] [facility];  Service: Cardiovascular;  Laterality: N/A;  . PERIPHERAL VASCULAR INTERVENTION Right 09/19/2016   Procedure: Peripheral Vascular Intervention;  Surgeon: Serafina Mitchell, MD;  Location: [place] [facility];  Service: Cardiovascular;  Laterality: Right;  ext iliac stent  . TEE WITHOUT CARDIOVERSION N/A 10/18/2015   Procedure: TRANSESOPHAGEAL ECHOCARDIOGRAM (TEE);  Surgeon: [name], MD;  Location: [place];  Service: Open Heart Surgery;   Laterality: N/A;    Family History  Problem Relation Age of Onset  . Heart attack Mother   . Stroke Mother   . Colon cancer Neg Hx     Social History   Tobacco Use  . Smoking status: Former Smoker    Packs/day: 2.00    Years: 30.00    Pack years: 60.00    Types: Cigarettes    Last attempt to quit: 08/14/2000    Years since quitting: 17.0  . Smokeless tobacco: Never Used  Substance Use Topics  . Alcohol use: No  . Drug use: No    Medications:  I have reviewed the patient's current medications. Prior to Admission:  (Not in a hospital admission) Scheduled: . aspirin EC  81 mg Oral Daily  . cholecalciferol  2,000 Units Oral Daily  . cilostazol  100 mg Oral BID  . enoxaparin (LOVENOX) injection  40 mg Subcutaneous Q24H  . ferrous sulfate  325 mg Oral Daily  . gabapentin  300 mg Oral Daily  . gabapentin  600 mg Oral QHS  . insulin aspart  0-15 Units Subcutaneous TID WC  . insulin aspart  0-5 Units Subcutaneous QHS  . insulin glargine  20 Units Subcutaneous QHS  . pantoprazole  80 mg Oral BID  . simvastatin  20 mg Oral QHS   Continuous: . piperacillin-tazobactam (ZOSYN)  IV Stopped (08/17/17 1038)  . vancomycin 750 mg (08/17/17 1109)   RFX:JOITGPQDIYMEB **OR** acetaminophen, ondansetron **OR** ondansetron (ZOFRAN) IV  Allergies: Allergies  Allergen Reactions  . No Known Allergies     ROS:  A comprehensive review of systems was negative except for: Constitutional: positive for chills, fevers and malaise  BMs every 2 Days, no bloody BMs, has dark stools from iron   Blood pressure 135/62, pulse (!) 59, temperature (!) 97.5 F (36.4 C), temperature source Oral, resp. rate 20, SpO2 100 %. Physical Exam  Constitutional: He is oriented to person, place, and time and well-developed, well-nourished, and in no distress.  HENT:  Head: Normocephalic.  Eyes: Pupils are equal, round, and reactive to light.  Neck: Normal range of motion.  Cardiovascular: Normal rate and  regular rhythm.  Pulmonary/Chest: Effort normal and breath sounds normal.  Abdominal: Soft. He exhibits no distension. There is tenderness in the left lower quadrant. There is no rebound.  Some guarding in the left lower quadrant  Musculoskeletal: Normal range of motion.  Neurological: He is alert and oriented to person, place, and time.  Skin: Skin is warm and dry.  Psychiatric: Mood, memory, affect and judgment normal.  Nursing note and vitals reviewed.   Results: Results for orders placed or performed during the hospital encounter of 08/16/17 (from the past 48 hour(s))  Comprehensive metabolic panel     Status: Abnormal   Collection Time: 08/16/17  9:23 PM  Result Value Ref Range   Sodium 132 (L) 135 - 145 mmol/L   Potassium 4.8 3.5 - 5.1 mmol/L   Chloride 101 101 - 111 mmol/L   CO2 17 (L)  22 - 32 mmol/L   Glucose, Bld 321 (H) 65 - 99 mg/dL   BUN 32 (H) 6 - 20 mg/dL   Creatinine, Ser 1.71 (H) 0.61 - 1.24 mg/dL   Calcium 8.9 8.9 - 10.3 mg/dL   Total Protein 6.7 6.5 - 8.1 g/dL   Albumin 3.4 (L) 3.5 - 5.0 g/dL   AST 17 15 - 41 U/L   ALT 10 (L) 17 - 63 U/L   Alkaline Phosphatase 77 38 - 126 U/L   Total Bilirubin 0.8 0.3 - 1.2 mg/dL   GFR calc non Af Amer 39 (L) >60 mL/min   GFR calc Af Amer 45 (L) >60 mL/min    Comment: (NOTE) The eGFR has been calculated using the CKD EPI equation. This calculation has not been validated in all clinical situations. eGFR's persistently <60 mL/min signify possible Chronic Kidney Disease.    Anion gap 14 5 - 15  CBC with Differential     Status: Abnormal   Collection Time: 08/16/17  9:23 PM  Result Value Ref Range   WBC 20.4 (H) 4.0 - 10.5 K/uL   RBC 4.45 4.22 - 5.81 MIL/uL   Hemoglobin 12.7 (L) 13.0 - 17.0 g/dL   HCT 39.6 39.0 - 52.0 %   MCV 89.0 78.0 - 100.0 fL   MCH 28.5 26.0 - 34.0 pg   MCHC 32.1 30.0 - 36.0 g/dL   RDW 15.3 11.5 - 15.5 %   Platelets 360 150 - 400 K/uL   Neutrophils Relative % 93 %   Neutro Abs 18.9 (H) 1.7 - 7.7  K/uL   Lymphocytes Relative 2 %   Lymphs Abs 0.3 (L) 0.7 - 4.0 K/uL   Monocytes Relative 5 %   Monocytes Absolute 1.1 (H) 0.1 - 1.0 K/uL   Eosinophils Relative 0 %   Eosinophils Absolute 0.0 0.0 - 0.7 K/uL   Basophils Relative 0 %   Basophils Absolute 0.0 0.0 - 0.1 K/uL  Protime-INR     Status: None   Collection Time: 08/16/17  9:23 PM  Result Value Ref Range   Prothrombin Time 13.4 11.4 - 15.2 seconds   INR 1.02   Culture, blood (Routine x 2)     Status: None (Preliminary result)   Collection Time: 08/16/17  9:23 PM  Result Value Ref Range   Specimen Description BLOOD RIGHT FOREARM    Special Requests      BOTTLES DRAWN AEROBIC AND ANAEROBIC Blood Culture adequate volume   Culture NO GROWTH < 12 HOURS    Report Status PENDING   Brain natriuretic peptide     Status: None   Collection Time: 08/16/17  9:23 PM  Result Value Ref Range   B Natriuretic Peptide 47.0 0.0 - 100.0 pg/mL  Troponin I     Status: None   Collection Time: 08/16/17  9:23 PM  Result Value Ref Range   Troponin I <0.03 <0.03 ng/mL  I-Stat CG4 Lactic Acid, ED     Status: Abnormal   Collection Time: 08/16/17  9:49 PM  Result Value Ref Range   Lactic Acid, Venous 2.53 (HH) 0.5 - 1.9 mmol/L  Culture, blood (Routine x 2)     Status: None (Preliminary result)   Collection Time: 08/16/17 10:05 PM  Result Value Ref Range   Specimen Description BLOOD LEFT ARM    Special Requests      BOTTLES DRAWN AEROBIC AND ANAEROBIC Blood Culture adequate volume   Culture NO GROWTH < 12 HOURS    Report  Status PENDING   Procalcitonin     Status: None   Collection Time: 08/16/17 10:05 PM  Result Value Ref Range   Procalcitonin 0.77 ng/mL    Comment:        Interpretation: PCT > 0.5 ng/mL and <= 2 ng/mL: Systemic infection (sepsis) is possible, but other conditions are known to elevate PCT as well. (NOTE)       Sepsis PCT Algorithm           Lower Respiratory Tract                                      Infection PCT  Algorithm    ----------------------------     ----------------------------         PCT < 0.25 ng/mL                PCT < 0.10 ng/mL         Strongly encourage             Strongly discourage   discontinuation of antibiotics    initiation of antibiotics    ----------------------------     -----------------------------       PCT 0.25 - 0.50 ng/mL            PCT 0.10 - 0.25 ng/mL               OR       >80% decrease in PCT            Discourage initiation of                                            antibiotics      Encourage discontinuation           of antibiotics    ----------------------------     -----------------------------         PCT >= 0.50 ng/mL              PCT 0.26 - 0.50 ng/mL                AND       <80% decrease in PCT             Encourage initiation of                                             antibiotics       Encourage continuation           of antibiotics    ----------------------------     -----------------------------        PCT >= 0.50 ng/mL                  PCT > 0.50 ng/mL               AND         increase in PCT                  Strongly encourage  initiation of antibiotics    Strongly encourage escalation           of antibiotics                                     -----------------------------                                           PCT <= 0.25 ng/mL                                                 OR                                        > 80% decrease in PCT                                     Discontinue / Do not initiate                                             antibiotics   I-Stat CG4 Lactic Acid, ED  (not at  [facility])     Status: None   Collection Time: 08/16/17 11:34 PM  Result Value Ref Range   Lactic Acid, Venous 1.49 0.5 - 1.9 mmol/L  Urinalysis, Routine w reflex microscopic     Status: Abnormal   Collection Time: 08/16/17 11:58 PM  Result Value Ref Range   Color, Urine YELLOW YELLOW   APPearance  CLEAR CLEAR   Specific Gravity, Urine 1.026 1.005 - 1.030   pH 5.0 5.0 - 8.0   Glucose, UA >=500 (A) NEGATIVE mg/dL   Hgb urine dipstick NEGATIVE NEGATIVE   Bilirubin Urine NEGATIVE NEGATIVE   Ketones, ur NEGATIVE NEGATIVE mg/dL   Protein, ur NEGATIVE NEGATIVE mg/dL   Nitrite NEGATIVE NEGATIVE   Leukocytes, UA NEGATIVE NEGATIVE   RBC / HPF NONE SEEN 0 - 5 RBC/hpf   WBC, UA 0-5 0 - 5 WBC/hpf   Bacteria, UA NONE SEEN NONE SEEN   Squamous Epithelial / LPF 0-5 (A) NONE SEEN  CBG monitoring, ED     Status: Abnormal   Collection Time: 08/17/17  1:53 AM  Result Value Ref Range   Glucose-Capillary 144 (H) 65 - 99 mg/dL  Comprehensive metabolic panel     Status: Abnormal   Collection Time: 08/17/17  5:00 AM  Result Value Ref Range   Sodium 137 135 - 145 mmol/L   Potassium 3.9 3.5 - 5.1 mmol/L    Comment: DELTA CHECK NOTED   Chloride 108 101 - 111 mmol/L   CO2 20 (L) 22 - 32 mmol/L   Glucose, Bld 96 65 - 99 mg/dL   BUN 26 (H) 6 - 20 mg/dL   Creatinine, Ser 1.44 (H) 0.61 - 1.24 mg/dL   Calcium 7.9 (L) 8.9 - 10.3 mg/dL   Total Protein 5.6 (L) 6.5 - 8.1  g/dL   Albumin 2.7 (L) 3.5 - 5.0 g/dL   AST 11 (L) 15 - 41 U/L   ALT 8 (L) 17 - 63 U/L   Alkaline Phosphatase 64 38 - 126 U/L   Total Bilirubin 0.5 0.3 - 1.2 mg/dL   GFR calc non Af Amer 48 (L) >60 mL/min   GFR calc Af Amer 56 (L) >60 mL/min    Comment: (NOTE) The eGFR has been calculated using the CKD EPI equation. This calculation has not been validated in all clinical situations. eGFR's persistently <60 mL/min signify possible Chronic Kidney Disease.    Anion gap 9 5 - 15  CBG monitoring, ED     Status: None   Collection Time: 08/17/17  7:59 AM  Result Value Ref Range   Glucose-Capillary 78 65 - 99 mg/dL  CBG monitoring, ED     Status: Abnormal   Collection Time: 08/17/17 11:56 AM  Result Value Ref Range   Glucose-Capillary 141 (H) 65 - 99 mg/dL   Personally reviewed CT scan and review A/p with Dr. Thornton Papas Radiologist, Area  of concern at the sigmoid colon, looks to me more like diverticula, very large and stool inside of it, do not think this is abscess but rather part of the lumen or diverticula   Ct Head Wo Contrast  Result Date: 08/17/2017 CLINICAL DATA:  Altered mental status.  Fever of unknown origin. EXAM: CT HEAD WITHOUT CONTRAST TECHNIQUE: Contiguous axial images were obtained from the base of the skull through the vertex without intravenous contrast. COMPARISON:  Head CT 05/31/2017 FINDINGS: Brain: Mild generalized atrophy, unchanged from prior exam. No intracranial hemorrhage, mass effect, or midline shift. No hydrocephalus. Cavum septum pellucidum, normal variant. The basilar cisterns are patent. No evidence of territorial infarct or acute ischemia. No extra-axial or intracranial fluid collection. Vascular: Atherosclerosis of skullbase vasculature without hyperdense vessel or abnormal calcification. Skull: Normal. Negative for fracture or focal lesion. Sinuses/Orbits: Paranasal sinuses and mastoid air cells are clear. The visualized orbits are unremarkable. Other: None. IMPRESSION: 1.  No acute intracranial abnormality. 2. Stable generalized atrophy. Electronically Signed   By: Jeb Levering M.D.   On: 08/17/2017 02:48   Ct Soft Tissue Neck W Contrast  Result Date: 08/17/2017 CLINICAL DATA:  Initial evaluation for acute fever of unknown origin. EXAM: CT NECK WITH CONTRAST TECHNIQUE: Multidetector CT imaging of the neck was performed using the standard protocol following the bolus administration of intravenous contrast. CONTRAST:  52m ISOVUE-300 IOPAMIDOL (ISOVUE-300) INJECTION 61% COMPARISON:  None. FINDINGS: Pharynx and larynx: Study limited by motion artifact. Oral cavity within normal limits without mass lesion or loculated fluid collection. No acute abnormality about the dentition. Palatine tonsils symmetric and within normal limits. No tonsillar or peritonsillar collection. Parapharyngeal fat preserved.  Nasopharynx within normal limits. No appreciable retropharyngeal collection. Epiglottis grossly normal. Vallecula largely effaced by the lingual tonsils. Remainder of the hypopharynx and supraglottic larynx within normal limits. True cords symmetric and normal. Subglottic airway clear. Salivary glands: Salivary glands demonstrate no acute abnormality. Thyroid: Thyroid normal. Lymph nodes: No adenopathy within the neck. Vascular: Extensive atherosclerosis present about the aortic arch and carotid bifurcations. Normal intravascular enhancement seen throughout the neck. Limited intracranial: Visualized brain within normal limits. Visualized orbits: Visualized globes and orbital soft tissues unremarkable. Mastoids and visualized paranasal sinuses: Partially visualized paranasal sinuses are clear. Partially visualize mastoids and middle ear cavities are clear. Skeleton: No acute osseus abnormality. No worrisome lytic or blastic osseous lesions. Median sternotomy wires noted. Upper chest:  Visualized upper chest demonstrates no acute abnormality. Mild scattered atelectatic changes noted within the visualized lungs. Partially visualized lungs are otherwise clear. Other: None. IMPRESSION: 1. No CT evidence for acute abnormality within the neck. No acute inflammatory changes or other finding to explain patient's symptoms. 2. Advanced atherosclerosis. Electronically Signed   By: Jeannine Boga M.D.   On: 08/17/2017 02:46   Ct Chest W Contrast  Result Date: 08/17/2017 CLINICAL DATA:  Fever of unknown origin. EXAM: CT CHEST, ABDOMEN, AND PELVIS WITH CONTRAST TECHNIQUE: Multidetector CT imaging of the chest, abdomen and pelvis was performed following the standard protocol during bolus administration of intravenous contrast. CONTRAST:  15m ISOVUE-300 IOPAMIDOL (ISOVUE-300) INJECTION 61% COMPARISON:  CT chest 01/19/2017.  CT abdomen and pelvis 02/02/2005 FINDINGS: CT CHEST FINDINGS Cardiovascular: Normal heart size. No  pericardial effusion. Normal caliber thoracic aorta. Aortic calcifications. Postoperative changes consistent with coronary bypass. Mediastinum/Nodes: No significant lymphadenopathy in the chest. Esophagus is decompressed. Thyroid gland is unremarkable. Lungs/Pleura: Mild dependent changes in the lungs. Previous lung infiltrates are improving. No focal consolidation. No pleural effusions. No pneumothorax. Airways are patent. Musculoskeletal: Postoperative median sternotomy. No destructive bone lesions. CT ABDOMEN PELVIS FINDINGS Hepatobiliary: No focal liver abnormality is seen. No gallstones, gallbladder wall thickening, or biliary dilatation. Pancreas: Unremarkable. No pancreatic ductal dilatation or surrounding inflammatory changes. Spleen: Normal in size without focal abnormality. Adrenals/Urinary Tract: No adrenal gland nodules. Small stones in the upper poles of both kidneys. Bilateral renal cysts. Prominent vascular calcification along the renal arteries and intrarenal arteries. Calcification at the origin of the renal artery's may result in renal artery stenosis. Nephrograms are symmetrical. No hydronephrosis or hydroureter. Bladder is distended, possibly indicating urine retention. No bladder wall thickening. Stomach/Bowel: Diverticulosis of the sigmoid colon. There is a large pericolonic collection adjacent to the sigmoid region in the left lower quadrant. The collection has a thickened wall and contains stool and gas. This collection measures about 5.9 cm in diameter. There is mild inflammatory stranding in the fat around the collection. There is direct communication with the colon. This most likely represents localized perforation of diverticulitis with a diverticular abscess. A perforated colon cancer could also have this appearance and follow-up imaging after resolution of the acute process is recommended to exclude underlying colon cancer. Scattered stool throughout the remainder the colon. No evidence  of colonic or small bowel obstruction. Stomach is decompressed. Appendix is normal. Vascular/Lymphatic: Extensive calcification of the aorta and branch vessels. Prominent calcifications may result in stenosis of the celiac axis, superior mesenteric artery, inferior mesenteric artery, and common iliac and external iliac arteries bilaterally. Poor contrast bolus limits evaluation of the arterial flow. There is a femoral femoral bypass graft. Reproductive: Prostate gland is enlarged. Other: No free air or free fluid in the abdomen. Musculoskeletal: No acute or significant osseous findings. IMPRESSION: 1. No evidence of active pulmonary disease. 2. Gas and stool-filled thick walled collection in the left lower quadrant measuring 5.9 cm diameter and communicating with sigmoid colon. This is consistent with an abscess, likely indicating diverticulitis with diverticular abscess. A less likely consideration would be a perforated colon cancer. Colonic evaluation is recommended after resolution of the acute process to exclude an underlying colon neoplasm. 3. Aortic atherosclerosis and extensive calcific atherosclerotic changes throughout the abdominal vasculature. Probable calcific stenosis at multiple vessels. Electronically Signed   By: WLucienne CapersM.D.   On: 08/17/2017 03:02   Ct Abdomen Pelvis W Contrast  Result Date: 08/17/2017 CLINICAL DATA:  Fever of unknown origin.  EXAM: CT CHEST, ABDOMEN, AND PELVIS WITH CONTRAST TECHNIQUE: Multidetector CT imaging of the chest, abdomen and pelvis was performed following the standard protocol during bolus administration of intravenous contrast. CONTRAST:  73m ISOVUE-300 IOPAMIDOL (ISOVUE-300) INJECTION 61% COMPARISON:  CT chest 01/19/2017.  CT abdomen and pelvis 02/02/2005 FINDINGS: CT CHEST FINDINGS Cardiovascular: Normal heart size. No pericardial effusion. Normal caliber thoracic aorta. Aortic calcifications. Postoperative changes consistent with coronary bypass.  Mediastinum/Nodes: No significant lymphadenopathy in the chest. Esophagus is decompressed. Thyroid gland is unremarkable. Lungs/Pleura: Mild dependent changes in the lungs. Previous lung infiltrates are improving. No focal consolidation. No pleural effusions. No pneumothorax. Airways are patent. Musculoskeletal: Postoperative median sternotomy. No destructive bone lesions. CT ABDOMEN PELVIS FINDINGS Hepatobiliary: No focal liver abnormality is seen. No gallstones, gallbladder wall thickening, or biliary dilatation. Pancreas: Unremarkable. No pancreatic ductal dilatation or surrounding inflammatory changes. Spleen: Normal in size without focal abnormality. Adrenals/Urinary Tract: No adrenal gland nodules. Small stones in the upper poles of both kidneys. Bilateral renal cysts. Prominent vascular calcification along the renal arteries and intrarenal arteries. Calcification at the origin of the renal artery's may result in renal artery stenosis. Nephrograms are symmetrical. No hydronephrosis or hydroureter. Bladder is distended, possibly indicating urine retention. No bladder wall thickening. Stomach/Bowel: Diverticulosis of the sigmoid colon. There is a large pericolonic collection adjacent to the sigmoid region in the left lower quadrant. The collection has a thickened wall and contains stool and gas. This collection measures about 5.9 cm in diameter. There is mild inflammatory stranding in the fat around the collection. There is direct communication with the colon. This most likely represents localized perforation of diverticulitis with a diverticular abscess. A perforated colon cancer could also have this appearance and follow-up imaging after resolution of the acute process is recommended to exclude underlying colon cancer. Scattered stool throughout the remainder the colon. No evidence of colonic or small bowel obstruction. Stomach is decompressed. Appendix is normal. Vascular/Lymphatic: Extensive calcification of  the aorta and branch vessels. Prominent calcifications may result in stenosis of the celiac axis, superior mesenteric artery, inferior mesenteric artery, and common iliac and external iliac arteries bilaterally. Poor contrast bolus limits evaluation of the arterial flow. There is a femoral femoral bypass graft. Reproductive: Prostate gland is enlarged. Other: No free air or free fluid in the abdomen. Musculoskeletal: No acute or significant osseous findings. IMPRESSION: 1. No evidence of active pulmonary disease. 2. Gas and stool-filled thick walled collection in the left lower quadrant measuring 5.9 cm diameter and communicating with sigmoid colon. This is consistent with an abscess, likely indicating diverticulitis with diverticular abscess. A less likely consideration would be a perforated colon cancer. Colonic evaluation is recommended after resolution of the acute process to exclude an underlying colon neoplasm. 3. Aortic atherosclerosis and extensive calcific atherosclerotic changes throughout the abdominal vasculature. Probable calcific stenosis at multiple vessels. Electronically Signed   By: WLucienne CapersM.D.   On: 08/17/2017 03:02   Dg Chest Portable 1 View  Result Date: 08/16/2017 CLINICAL DATA:  Initial evaluation for code sepsis. EXAM: PORTABLE CHEST 1 VIEW COMPARISON:  Right radiograph from 08/01/2017. FINDINGS: Median sternotomy wires with underlying CABG markers and surgical clips. Stable cardiomegaly. Mediastinal silhouette normal. Aortic atherosclerosis. Lungs hypoinflated. Mild diffuse pulmonary vascular congestion without frank pulmonary edema. No focal infiltrates. No appreciable pleural effusion. No pneumothorax. No acute osseous abnormality. IMPRESSION: 1. Cardiomegaly with mild diffuse pulmonary vascular congestion without overt pulmonary edema. 2. No other active cardiopulmonary disease. No focal infiltrates identified. Electronically Signed  By: Jeannine Boga M.D.   On:  08/16/2017 22:49     Assessment & Plan:  INDALECIO MALMSTROM is a 72 y.o. male with diverticulitis and thickening of the sigmoid colon with an area that had originally been called an abscess on the CT scan but on my interpretation and Dr. Thornton Papas interpretation looks more consistent with a diverticula or portion of the lumen. If it is an abscess it has formed a drainage connection with the colon and should be adequately draining itself. Doing any IR will just put the patient at risk for colocutaneous fistula formation. Patient is tender on my exam but prior to me pressing on his abdomen and no abdominal complaints.  He has stools every 2 days and reports that they are dark but that he takes iron. He denies any blood per rectum. His colonoscopy was in 2015 and was negative except for diverticulosis.  Given the findings on CT it is most consistent with diverticulitis, but he will need a colonoscopy in the upcoming weeks to verify we are not missing a colon cancer.   -Discussed this extensively with the patient and his wife  -Clear liquids for now, will monitor his abdominal exam  -IV antibiotics, no plan for IR drainage  -Will need outpatient colonoscopy -If comes to needing surgery, will get a Hartman's procedure with End Colostomy given the inflammation and thickening of the colon. Given the patient's heart history, I would recommend getting cardiology involved to discuss and perioperative risk stratification that needs to be done and to determine if the patient is even an appropriate candidate for [facility] given the history.   -I do not see where the patient has any recent stents, so would hold Pletal / aspirin if possible, will defer to Dr. Clementeen Graham and cardiology for best plan, likely on the Pletal for PAD but has bene on Plavix in the past   All questions were answered to the satisfaction of the patient and family.  Updated Dr. Clementeen Graham.   Virl Cagey 08/17/2017, 12:30 PM

## 2017-08-17 NOTE — ED Notes (Signed)
Surgeon has been at the bedside, no procedure at this time. Pt can have clear liquids

## 2017-08-17 NOTE — ED Notes (Signed)
66ml output clear, yellow urine

## 2017-08-17 NOTE — Progress Notes (Addendum)
Pharmacy Antibiotic Note  [name] is a 72 y.o. male admitted on 08/16/2017 with sepsis.  Pharmacy has been consulted for vancomycin and zosyn dosing.   PMH significant for CAD/CABG, prosthetic heart valve 2016, DM2, dyslipidemia, HTN, PVD, peripheral neuropathy, and recently multiple hospitalizations for sepsis of unknown etiology.  Now with new fever and nausea. Also noted to have stage III CKD, baseline SCr ~1.7.  Most recent wt: 75 kg from 08/01/17. Most recent SCr 1.71 on 08/16/17.  Est CrCl ~40 ml/min.  Vancomycin 1 G IV x 1 given at 2218 Zosyn 3.375 G IV x 1 over 30 min given at 2218  Plan: - Zosyn 3.375G IV q8h to be infused over 4 hours - first dose due 0600 - Vancomycin 1000 mg q24h - first dose due 1/4 at 2200 - goal trough ~ 15 - F/u  Current wt and BMP at 0500 to evaluate renal function, readjust dosing as needed - Follow up SCr, UOP, cultures, clinical course and adjust as clinically indicated     Temp (24hrs), Avg:101.3 F (38.5 C), Min:99 F (37.2 C), Max:102.9 F (39.4 C)  Recent Labs  Lab 08/16/17 2123 08/16/17 2149 08/16/17 2334  WBC 20.4*  --   --   CREATININE 1.71*  --   --   LATICACIDVEN  --  2.53* 1.49    CrCl cannot be calculated (Unknown ideal weight.).    Allergies  Allergen Reactions  . No Known Allergies     Antimicrobials this admission: Zosyn  08/16/17 >>  Vancomcyin 08/16/17 >>   Dose adjustments this admission:   Microbiology results:  08/16/17 BCx x 2: pending  UCx:    Sputum:    MRSA PCR:   Thank you for allowing pharmacy to be a part of this patient's care.  Vonda Antigua 08/17/2017 3:25 AM

## 2017-08-18 DIAGNOSIS — K5732 Diverticulitis of large intestine without perforation or abscess without bleeding: Secondary | ICD-10-CM

## 2017-08-18 DIAGNOSIS — I10 Essential (primary) hypertension: Secondary | ICD-10-CM

## 2017-08-18 DIAGNOSIS — N183 Chronic kidney disease, stage 3 (moderate): Secondary | ICD-10-CM

## 2017-08-18 DIAGNOSIS — A419 Sepsis, unspecified organism: Principal | ICD-10-CM

## 2017-08-18 LAB — RESPIRATORY PANEL BY PCR
Adenovirus: NOT DETECTED
Bordetella pertussis: NOT DETECTED
CORONAVIRUS OC43-RVPPCR: NOT DETECTED
Chlamydophila pneumoniae: NOT DETECTED
Coronavirus 229E: NOT DETECTED
Coronavirus HKU1: NOT DETECTED
Coronavirus NL63: NOT DETECTED
INFLUENZA A-RVPPCR: NOT DETECTED
Influenza B: NOT DETECTED
MYCOPLASMA PNEUMONIAE-RVPPCR: NOT DETECTED
Metapneumovirus: NOT DETECTED
PARAINFLUENZA VIRUS 1-RVPPCR: NOT DETECTED
PARAINFLUENZA VIRUS 4-RVPPCR: NOT DETECTED
Parainfluenza Virus 2: NOT DETECTED
Parainfluenza Virus 3: NOT DETECTED
RHINOVIRUS / ENTEROVIRUS - RVPPCR: NOT DETECTED
Respiratory Syncytial Virus: NOT DETECTED

## 2017-08-18 LAB — BASIC METABOLIC PANEL
ANION GAP: 7 (ref 5–15)
BUN: 14 mg/dL (ref 6–20)
CALCIUM: 8.4 mg/dL — AB (ref 8.9–10.3)
CO2: 23 mmol/L (ref 22–32)
Chloride: 106 mmol/L (ref 101–111)
Creatinine, Ser: 1.26 mg/dL — ABNORMAL HIGH (ref 0.61–1.24)
GFR, EST NON AFRICAN AMERICAN: 57 mL/min — AB (ref >60)
GLUCOSE: 100 mg/dL — AB (ref 65–99)
POTASSIUM: 4.1 mmol/L (ref 3.5–5.1)
SODIUM: 136 mmol/L (ref 135–145)

## 2017-08-18 LAB — GLUCOSE, CAPILLARY
GLUCOSE-CAPILLARY: 145 mg/dL — AB (ref 65–99)
GLUCOSE-CAPILLARY: 173 mg/dL — AB (ref 65–99)
GLUCOSE-CAPILLARY: 283 mg/dL — AB (ref 65–99)

## 2017-08-18 LAB — CBC
HCT: 39.2 % (ref 39.0–52.0)
Hemoglobin: 12.5 g/dL — ABNORMAL LOW (ref 13.0–17.0)
MCH: 28.7 pg (ref 26.0–34.0)
MCHC: 31.9 g/dL (ref 30.0–36.0)
MCV: 89.9 fL (ref 78.0–100.0)
PLATELETS: 272 10*3/uL (ref 150–400)
RBC: 4.36 MIL/uL (ref 4.22–5.81)
RDW: 15.6 % — AB (ref 11.5–15.5)
WBC: 6.7 10*3/uL (ref 4.0–10.5)

## 2017-08-18 NOTE — Progress Notes (Signed)
[facility] Progress Note     Subjective: Feeling better. Pain improved. Tolerated clears.   Objective: Vital signs in last 24 hours: Temp:  [97.6 F (36.4 C)-98.2 F (36.8 C)] 97.6 F (36.4 C) (01/05 0813) Pulse Rate:  [59-83] 75 (01/05 0813) Resp:  [9-22] 15 (01/05 0813) BP: (112-157)/(52-105) 128/75 (01/05 0500) SpO2:  [91 %-100 %] 98 % (01/05 0813) Weight:  [157 lb 6.5 oz (71.4 kg)-161 lb 13.1 oz (73.4 kg)] 157 lb 6.5 oz (71.4 kg) (01/05 0400) Last BM Date: 08/17/17  Intake/Output from previous day: 01/04 0701 - 01/05 0700 In: 450 [IV Piggyback:450] Out: 4166 [Urine:4050] Intake/Output this shift: No intake/output data recorded.  General appearance: alert, cooperative and no distress Resp: normal work breathing GI: soft, nondistended, minimally tender LLQ, no rebound or guarding Extremities: extremities normal, atraumatic, no cyanosis or edema  Lab Results:  Recent Labs    08/16/17 2123 08/18/17 0452  WBC 20.4* 6.7  HGB 12.7* 12.5*  HCT 39.6 39.2  PLT 360 272   BMET Recent Labs    08/17/17 0500 08/18/17 0452  NA 137 136  K 3.9 4.1  CL 108 106  CO2 20* 23  GLUCOSE 96 100*  BUN 26* 14  CREATININE 1.44* 1.26*  CALCIUM 7.9* 8.4*   PT/INR Recent Labs    08/16/17 2123  LABPROT 13.4  INR 1.02    Studies/Results: Ct Head Wo Contrast  Result Date: 08/17/2017 CLINICAL DATA:  Altered mental status.  Fever of unknown origin. EXAM: CT HEAD WITHOUT CONTRAST TECHNIQUE: Contiguous axial images were obtained from the base of the skull through the vertex without intravenous contrast. COMPARISON:  Head CT 05/31/2017 FINDINGS: Brain: Mild generalized atrophy, unchanged from prior exam. No intracranial hemorrhage, mass effect, or midline shift. No hydrocephalus. Cavum septum pellucidum, normal variant. The basilar cisterns are patent. No evidence of territorial infarct or acute ischemia. No extra-axial or intracranial fluid collection. Vascular:  Atherosclerosis of skullbase vasculature without hyperdense vessel or abnormal calcification. Skull: Normal. Negative for fracture or focal lesion. Sinuses/Orbits: Paranasal sinuses and mastoid air cells are clear. The visualized orbits are unremarkable. Other: None. IMPRESSION: 1.  No acute intracranial abnormality. 2. Stable generalized atrophy. Electronically Signed   By: Jeb Levering M.D.   On: 08/17/2017 02:48   Ct Soft Tissue Neck W Contrast  Result Date: 08/17/2017 CLINICAL DATA:  Initial evaluation for acute fever of unknown origin. EXAM: CT NECK WITH CONTRAST TECHNIQUE: Multidetector CT imaging of the neck was performed using the standard protocol following the bolus administration of intravenous contrast. CONTRAST:  42mL ISOVUE-300 IOPAMIDOL (ISOVUE-300) INJECTION 61% COMPARISON:  None. FINDINGS: Pharynx and larynx: Study limited by motion artifact. Oral cavity within normal limits without mass lesion or loculated fluid collection. No acute abnormality about the dentition. Palatine tonsils symmetric and within normal limits. No tonsillar or peritonsillar collection. Parapharyngeal fat preserved. Nasopharynx within normal limits. No appreciable retropharyngeal collection. Epiglottis grossly normal. Vallecula largely effaced by the lingual tonsils. Remainder of the hypopharynx and supraglottic larynx within normal limits. True cords symmetric and normal. Subglottic airway clear. Salivary glands: Salivary glands demonstrate no acute abnormality. Thyroid: Thyroid normal. Lymph nodes: No adenopathy within the neck. Vascular: Extensive atherosclerosis present about the aortic arch and carotid bifurcations. Normal intravascular enhancement seen throughout the neck. Limited intracranial: Visualized brain within normal limits. Visualized orbits: Visualized globes and orbital soft tissues unremarkable. Mastoids and visualized paranasal sinuses: Partially visualized paranasal sinuses are clear. Partially  visualize mastoids and middle ear cavities are clear.  Skeleton: No acute osseus abnormality. No worrisome lytic or blastic osseous lesions. Median sternotomy wires noted. Upper chest: Visualized upper chest demonstrates no acute abnormality. Mild scattered atelectatic changes noted within the visualized lungs. Partially visualized lungs are otherwise clear. Other: None. IMPRESSION: 1. No CT evidence for acute abnormality within the neck. No acute inflammatory changes or other finding to explain patient's symptoms. 2. Advanced atherosclerosis. Electronically Signed   By: Jeannine Boga M.D.   On: 08/17/2017 02:46   Ct Chest W Contrast  Result Date: 08/17/2017 CLINICAL DATA:  Fever of unknown origin. EXAM: CT CHEST, ABDOMEN, AND PELVIS WITH CONTRAST TECHNIQUE: Multidetector CT imaging of the chest, abdomen and pelvis was performed following the standard protocol during bolus administration of intravenous contrast. CONTRAST:  46mL ISOVUE-300 IOPAMIDOL (ISOVUE-300) INJECTION 61% COMPARISON:  CT chest 01/19/2017.  CT abdomen and pelvis 02/02/2005 FINDINGS: CT CHEST FINDINGS Cardiovascular: Normal heart size. No pericardial effusion. Normal caliber thoracic aorta. Aortic calcifications. Postoperative changes consistent with coronary bypass. Mediastinum/Nodes: No significant lymphadenopathy in the chest. Esophagus is decompressed. Thyroid gland is unremarkable. Lungs/Pleura: Mild dependent changes in the lungs. Previous lung infiltrates are improving. No focal consolidation. No pleural effusions. No pneumothorax. Airways are patent. Musculoskeletal: Postoperative median sternotomy. No destructive bone lesions. CT ABDOMEN PELVIS FINDINGS Hepatobiliary: No focal liver abnormality is seen. No gallstones, gallbladder wall thickening, or biliary dilatation. Pancreas: Unremarkable. No pancreatic ductal dilatation or surrounding inflammatory changes. Spleen: Normal in size without focal abnormality. Adrenals/Urinary  Tract: No adrenal gland nodules. Small stones in the upper poles of both kidneys. Bilateral renal cysts. Prominent vascular calcification along the renal arteries and intrarenal arteries. Calcification at the origin of the renal artery's may result in renal artery stenosis. Nephrograms are symmetrical. No hydronephrosis or hydroureter. Bladder is distended, possibly indicating urine retention. No bladder wall thickening. Stomach/Bowel: Diverticulosis of the sigmoid colon. There is a large pericolonic collection adjacent to the sigmoid region in the left lower quadrant. The collection has a thickened wall and contains stool and gas. This collection measures about 5.9 cm in diameter. There is mild inflammatory stranding in the fat around the collection. There is direct communication with the colon. This most likely represents localized perforation of diverticulitis with a diverticular abscess. A perforated colon cancer could also have this appearance and follow-up imaging after resolution of the acute process is recommended to exclude underlying colon cancer. Scattered stool throughout the remainder the colon. No evidence of colonic or small bowel obstruction. Stomach is decompressed. Appendix is normal. Vascular/Lymphatic: Extensive calcification of the aorta and branch vessels. Prominent calcifications may result in stenosis of the celiac axis, superior mesenteric artery, inferior mesenteric artery, and common iliac and external iliac arteries bilaterally. Poor contrast bolus limits evaluation of the arterial flow. There is a femoral femoral bypass graft. Reproductive: Prostate gland is enlarged. Other: No free air or free fluid in the abdomen. Musculoskeletal: No acute or significant osseous findings. IMPRESSION: 1. No evidence of active pulmonary disease. 2. Gas and stool-filled thick walled collection in the left lower quadrant measuring 5.9 cm diameter and communicating with sigmoid colon. This is consistent  with an abscess, likely indicating diverticulitis with diverticular abscess. A less likely consideration would be a perforated colon cancer. Colonic evaluation is recommended after resolution of the acute process to exclude an underlying colon neoplasm. 3. Aortic atherosclerosis and extensive calcific atherosclerotic changes throughout the abdominal vasculature. Probable calcific stenosis at multiple vessels. Electronically Signed   By: Lucienne Capers M.D.   On: 08/17/2017 03:02  Ct Abdomen Pelvis W Contrast  Result Date: 08/17/2017 CLINICAL DATA:  Fever of unknown origin. EXAM: CT CHEST, ABDOMEN, AND PELVIS WITH CONTRAST TECHNIQUE: Multidetector CT imaging of the chest, abdomen and pelvis was performed following the standard protocol during bolus administration of intravenous contrast. CONTRAST:  92mL ISOVUE-300 IOPAMIDOL (ISOVUE-300) INJECTION 61% COMPARISON:  CT chest 01/19/2017.  CT abdomen and pelvis 02/02/2005 FINDINGS: CT CHEST FINDINGS Cardiovascular: Normal heart size. No pericardial effusion. Normal caliber thoracic aorta. Aortic calcifications. Postoperative changes consistent with coronary bypass. Mediastinum/Nodes: No significant lymphadenopathy in the chest. Esophagus is decompressed. Thyroid gland is unremarkable. Lungs/Pleura: Mild dependent changes in the lungs. Previous lung infiltrates are improving. No focal consolidation. No pleural effusions. No pneumothorax. Airways are patent. Musculoskeletal: Postoperative median sternotomy. No destructive bone lesions. CT ABDOMEN PELVIS FINDINGS Hepatobiliary: No focal liver abnormality is seen. No gallstones, gallbladder wall thickening, or biliary dilatation. Pancreas: Unremarkable. No pancreatic ductal dilatation or surrounding inflammatory changes. Spleen: Normal in size without focal abnormality. Adrenals/Urinary Tract: No adrenal gland nodules. Small stones in the upper poles of both kidneys. Bilateral renal cysts. Prominent vascular  calcification along the renal arteries and intrarenal arteries. Calcification at the origin of the renal artery's may result in renal artery stenosis. Nephrograms are symmetrical. No hydronephrosis or hydroureter. Bladder is distended, possibly indicating urine retention. No bladder wall thickening. Stomach/Bowel: Diverticulosis of the sigmoid colon. There is a large pericolonic collection adjacent to the sigmoid region in the left lower quadrant. The collection has a thickened wall and contains stool and gas. This collection measures about 5.9 cm in diameter. There is mild inflammatory stranding in the fat around the collection. There is direct communication with the colon. This most likely represents localized perforation of diverticulitis with a diverticular abscess. A perforated colon cancer could also have this appearance and follow-up imaging after resolution of the acute process is recommended to exclude underlying colon cancer. Scattered stool throughout the remainder the colon. No evidence of colonic or small bowel obstruction. Stomach is decompressed. Appendix is normal. Vascular/Lymphatic: Extensive calcification of the aorta and branch vessels. Prominent calcifications may result in stenosis of the celiac axis, superior mesenteric artery, inferior mesenteric artery, and common iliac and external iliac arteries bilaterally. Poor contrast bolus limits evaluation of the arterial flow. There is a femoral femoral bypass graft. Reproductive: Prostate gland is enlarged. Other: No free air or free fluid in the abdomen. Musculoskeletal: No acute or significant osseous findings. IMPRESSION: 1. No evidence of active pulmonary disease. 2. Gas and stool-filled thick walled collection in the left lower quadrant measuring 5.9 cm diameter and communicating with sigmoid colon. This is consistent with an abscess, likely indicating diverticulitis with diverticular abscess. A less likely consideration would be a perforated  colon cancer. Colonic evaluation is recommended after resolution of the acute process to exclude an underlying colon neoplasm. 3. Aortic atherosclerosis and extensive calcific atherosclerotic changes throughout the abdominal vasculature. Probable calcific stenosis at multiple vessels. Electronically Signed   By: Lucienne Capers M.D.   On: 08/17/2017 03:02   Dg Chest Portable 1 View  Result Date: 08/16/2017 CLINICAL DATA:  Initial evaluation for code sepsis. EXAM: PORTABLE CHEST 1 VIEW COMPARISON:  Right radiograph from 08/01/2017. FINDINGS: Median sternotomy wires with underlying CABG markers and surgical clips. Stable cardiomegaly. Mediastinal silhouette normal. Aortic atherosclerosis. Lungs hypoinflated. Mild diffuse pulmonary vascular congestion without frank pulmonary edema. No focal infiltrates. No appreciable pleural effusion. No pneumothorax. No acute osseous abnormality. IMPRESSION: 1. Cardiomegaly with mild diffuse pulmonary vascular congestion without  overt pulmonary edema. 2. No other active cardiopulmonary disease. No focal infiltrates identified. Electronically Signed   By: Jeannine Boga M.D.   On: 08/16/2017 22:49    Anti-infectives: Anti-infectives (From admission, onward)   Start     Dose/Rate Route Frequency Ordered Stop   08/17/17 2200  vancomycin (VANCOCIN) IVPB 1000 mg/200 mL premix  Status:  Discontinued     1,000 mg 200 mL/hr over 60 Minutes Intravenous Every 24 hours 08/17/17 0334 08/17/17 1022   08/17/17 1100  vancomycin (VANCOCIN) IVPB 750 mg/150 ml premix     750 mg 150 mL/hr over 60 Minutes Intravenous Every 12 hours 08/17/17 1022     08/17/17 0600  piperacillin-tazobactam (ZOSYN) IVPB 3.375 g     3.375 g 12.5 mL/hr over 240 Minutes Intravenous Every 8 hours 08/17/17 0334     08/17/17 0145  vancomycin (VANCOCIN) IVPB 1000 mg/200 mL premix  Status:  Discontinued     1,000 mg 200 mL/hr over 60 Minutes Intravenous Every 12 hours 08/17/17 0134 08/17/17 0155    08/17/17 0133  piperacillin-tazobactam (ZOSYN) IVPB 3.375 g  Status:  Discontinued     3.375 g 100 mL/hr over 30 Minutes Intravenous Every 6 hours 08/17/17 0134 08/17/17 0141   08/16/17 2200  vancomycin (VANCOCIN) IVPB 1000 mg/200 mL premix     1,000 mg 200 mL/hr over 60 Minutes Intravenous  Once 08/16/17 2158 08/16/17 2323   08/16/17 2200  piperacillin-tazobactam (ZOSYN) IVPB 3.375 g     3.375 g 100 mL/hr over 30 Minutes Intravenous  Once 08/16/17 2158 08/16/17 2324      Assessment/Plan: Mr. Schippers is a 72 yo with diverticulitis and possible perforation but more likely a large diverticula on CT scan. Improving greatly with antibiotics.  -Soft diet -IV antibiotics today, hopefully home tomorrow of po antibiotics -Will need colonoscopy in about 6 weeks to evaluate that the thickening is not a cancer   Updated Dr. Clementeen Graham.    LOS: 1 day    Virl Cagey 08/18/2017

## 2017-08-18 NOTE — Progress Notes (Signed)
PROGRESS NOTE                                                                                                                                                                                                             Patient Demographics:    [name], is a 72 y.o. male, DOB - May 15, 1946, DXI:[id number]  Admit date - 08/16/2017   Admitting Physician Pratik Darleen Crocker, DO  Outpatient Primary MD for the patient is [name], MD  LOS - 1  Outpatient Specialists:   Chief Complaint  Patient presents with  . Fever       Brief Narrative   72 year old male with coronary artery disease/CABG, prosthetic heart valve placed in 2016, type 2 diabetes mellitus, hypertension or peripheral vascular disease, type 2 diabetes mellitus with peripheral neuropathy and multiple hospitalization recently for sepsis of unclear etiology presented again with fevers or chills associated with some nausea and near syncopal episode for past few days. Patient previously hospitalized with pneumonia, UTI, bacteremia with Escherichia coli and Enterobacter and recent hospitalization 2 weeks back with sepsis of unclear etiology. The ED he was septic with significant leukocytosis and elevated lactic acid. CT of the abdomen and pelvis showing sigmoid diverticulosis with 6 cm collection suggestive of possible localized perforation of diverticulitis with a diverticular abscess. Patient placed on broad-spectrum antibiotics .    Subjective:   Denies further abdominal pain. Remains afebrile and Sepsis  resolved.  Assessment  & Plan :    Principal Problem:   Sepsis ([place]) Possibly due to sigmoid diverticulitis. Discussed with IR who reviewed abdominal CT and do not of the that he has a sigmoid abscess and do not feel comfortable putting a drain with the risk of colonic perforation. Discussed with surgery who agree with the same. Sepsis has now resolved. Continue  empiric vancomycin and Zosyn and follow blood cultures. Patient does not have any hardware or pacemaker could be source of infection. If blood cultures positive he will need a TEE. Patient will need colonoscopy as outpatient once acute infection has resolved. Will arrange outpatient follow-up with his gastroenterologist Dr. Sydell Axon.  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  Coronary artery disease with history of CABG Peripheral artery disease with stenting in 09/2016 Continue aspirin, Pletal and  statin.  Diabetic neuropathy Continue gabapentin.     CKD (chronic kidney disease) stage 3, GFR 30-59 ml/min (HCC) Renal function at baseline. Continue to monitor.        Code Status : Full code  Family Communication  : Wife at bedside  Disposition Plan  : Home possibly tomorrow if cultures negative and stable.  Barriers For Discharge : Active symptoms  Consults  :   IR Gen. surgery  Procedures  :  CT head, neck, chest, abdomen and pelvis  DVT Prophylaxis  :  Lovenox -   Lab Results  Component Value Date   PLT 272 08/18/2017    Antibiotics  :    Anti-infectives (From admission, onward)   Start     Dose/Rate Route Frequency Ordered Stop   08/17/17 2200  vancomycin (VANCOCIN) IVPB 1000 mg/200 mL premix  Status:  Discontinued     1,000 mg 200 mL/hr over 60 Minutes Intravenous Every 24 hours 08/17/17 0334 08/17/17 1022   08/17/17 1100  vancomycin (VANCOCIN) IVPB 750 mg/150 ml premix     750 mg 150 mL/hr over 60 Minutes Intravenous Every 12 hours 08/17/17 1022     08/17/17 0600  piperacillin-tazobactam (ZOSYN) IVPB 3.375 g     3.375 g 12.5 mL/hr over 240 Minutes Intravenous Every 8 hours 08/17/17 0334     08/17/17 0145  vancomycin (VANCOCIN) IVPB 1000 mg/200 mL premix  Status:  Discontinued     1,000 mg 200 mL/hr over 60 Minutes Intravenous Every 12 hours 08/17/17 0134 08/17/17  0155   08/17/17 0133  piperacillin-tazobactam (ZOSYN) IVPB 3.375 g  Status:  Discontinued     3.375 g 100 mL/hr over 30 Minutes Intravenous Every 6 hours 08/17/17 0134 08/17/17 0141   08/16/17 2200  vancomycin (VANCOCIN) IVPB 1000 mg/200 mL premix     1,000 mg 200 mL/hr over 60 Minutes Intravenous  Once 08/16/17 2158 08/16/17 2323   08/16/17 2200  piperacillin-tazobactam (ZOSYN) IVPB 3.375 g     3.375 g 100 mL/hr over 30 Minutes Intravenous  Once 08/16/17 2158 08/16/17 2324        Objective:   Vitals:   08/18/17 0300 08/18/17 0400 08/18/17 0500 08/18/17 0813  BP: (!) 112/56 126/68 128/75   Pulse: 77 77 65 75  Resp: 16 17 13 15   Temp:  97.9 F (36.6 C)  97.6 F (36.4 C)  TempSrc:  Oral  Axillary  SpO2: 92% 91% 98% 98%  Weight:  71.4 kg (157 lb 6.5 oz)      Wt Readings from Last 3 Encounters:  08/18/17 71.4 kg (157 lb 6.5 oz)  08/01/17 72.7 kg (160 lb 4.4 oz)  06/05/17 71.4 kg (157 lb 6.5 oz)     Intake/Output Summary (Last 24 hours) at 08/18/2017 1415 Last data filed at 08/18/2017 0800 Gross per 24 hour  Intake 650 ml  Output 2600 ml  Net -1950 ml     Physical Exam Gen.: Elderly male not in distress HEENT: Moist mucosa, supple neck  chest: Clear bilaterally  CVS: Normal S1 and S2, no murmurs rub or gallop GI: Soft, nondistended, nontender, bowel sounds present Musculoskeletal : Warm, no edema     Data Review:    CBC Recent Labs  Lab 08/16/17 2123 08/18/17 0452  WBC 20.4* 6.7  HGB 12.7* 12.5*  HCT 39.6 39.2  PLT 360 272  MCV 89.0 89.9  MCH 28.5 28.7  MCHC 32.1 31.9  RDW 15.3 15.6*  LYMPHSABS 0.3*  --   MONOABS  1.1*  --   EOSABS 0.0  --   BASOSABS 0.0  --     Chemistries  Recent Labs  Lab 08/16/17 2123 08/17/17 0500 08/18/17 0452  NA 132* 137 136  K 4.8 3.9 4.1  CL 101 108 106  CO2 17* 20* 23  GLUCOSE 321* 96 100*  BUN 32* 26* 14  CREATININE 1.71* 1.44* 1.26*  CALCIUM 8.9 7.9* 8.4*  AST 17 11*  --   ALT 10* 8*  --   ALKPHOS 77 64   --   BILITOT 0.8 0.5  --    ------------------------------------------------------------------------------------------------------------------ No results for input(s): CHOL, HDL, LDLCALC, TRIG, CHOLHDL, LDLDIRECT in the last 72 hours.  Lab Results  Component Value Date   HGBA1C 9.8 (H) 05/31/2017   ------------------------------------------------------------------------------------------------------------------ No results for input(s): TSH, T4TOTAL, T3FREE, THYROIDAB in the last 72 hours.  Invalid input(s): FREET3 ------------------------------------------------------------------------------------------------------------------ No results for input(s): VITAMINB12, FOLATE, FERRITIN, TIBC, IRON, RETICCTPCT in the last 72 hours.  Coagulation profile Recent Labs  Lab 08/16/17 2123  INR 1.02    No results for input(s): DDIMER in the last 72 hours.  Cardiac Enzymes Recent Labs  Lab 08/16/17 2123  TROPONINI <0.03   ------------------------------------------------------------------------------------------------------------------    Component Value Date/Time   BNP 47.0 08/16/2017 2123    Inpatient Medications  Scheduled Meds: . aspirin EC  81 mg Oral Daily  . cholecalciferol  2,000 Units Oral Daily  . cilostazol  100 mg Oral BID  . enoxaparin (LOVENOX) injection  40 mg Subcutaneous Q24H  . ferrous sulfate  325 mg Oral Daily  . gabapentin  300 mg Oral Daily  . gabapentin  600 mg Oral QHS  . insulin aspart  0-15 Units Subcutaneous TID WC  . insulin aspart  0-5 Units Subcutaneous QHS  . insulin glargine  20 Units Subcutaneous QHS  . pantoprazole  80 mg Oral BID  . simvastatin  20 mg Oral QHS   Continuous Infusions: . piperacillin-tazobactam (ZOSYN)  IV 3.375 g (08/18/17 1352)  . vancomycin Stopped (08/18/17 1202)   PRN Meds:.acetaminophen **OR** acetaminophen, ondansetron **OR** ondansetron (ZOFRAN) IV  Micro Results Recent Results (from the past 240 hour(s))    Culture, blood (Routine x 2)     Status: None (Preliminary result)   Collection Time: 08/16/17  9:23 PM  Result Value Ref Range Status   Specimen Description BLOOD RIGHT FOREARM  Final   Special Requests   Final    BOTTLES DRAWN AEROBIC AND ANAEROBIC Blood Culture adequate volume   Culture NO GROWTH 2 DAYS  Final   Report Status PENDING  Incomplete  Culture, blood (Routine x 2)     Status: None (Preliminary result)   Collection Time: 08/16/17 10:05 PM  Result Value Ref Range Status   Specimen Description BLOOD LEFT ARM  Final   Special Requests   Final    BOTTLES DRAWN AEROBIC AND ANAEROBIC Blood Culture adequate volume   Culture NO GROWTH 2 DAYS  Final   Report Status PENDING  Incomplete  Respiratory Panel by PCR     Status: None   Collection Time: 08/17/17  5:40 PM  Result Value Ref Range Status   Adenovirus NOT DETECTED NOT DETECTED Final   Coronavirus 229E NOT DETECTED NOT DETECTED Final   Coronavirus HKU1 NOT DETECTED NOT DETECTED Final   Coronavirus NL63 NOT DETECTED NOT DETECTED Final   Coronavirus OC43 NOT DETECTED NOT DETECTED Final   Metapneumovirus NOT DETECTED NOT DETECTED Final   Rhinovirus / Enterovirus NOT DETECTED NOT  DETECTED Final   Influenza A NOT DETECTED NOT DETECTED Final   Influenza B NOT DETECTED NOT DETECTED Final   Parainfluenza Virus 1 NOT DETECTED NOT DETECTED Final   Parainfluenza Virus 2 NOT DETECTED NOT DETECTED Final   Parainfluenza Virus 3 NOT DETECTED NOT DETECTED Final   Parainfluenza Virus 4 NOT DETECTED NOT DETECTED Final   Respiratory Syncytial Virus NOT DETECTED NOT DETECTED Final   Bordetella pertussis NOT DETECTED NOT DETECTED Final   Chlamydophila pneumoniae NOT DETECTED NOT DETECTED Final   Mycoplasma pneumoniae NOT DETECTED NOT DETECTED Final    Comment: Performed at [facility], [place] [address], [place], [place] [address]    Radiology Reports Dg Chest 2 View  Result Date: 08/01/2017 CLINICAL DATA:  Lethargy, fever and  weakness today. History of hypertension, CABG. EXAM: CHEST  2 VIEW COMPARISON:  Chest radiograph May 31, 2017 FINDINGS: Cardiac silhouette is at least moderately enlarged. Calcified aortic knob. Mild bronchitic changes. Status post median sternotomy for CABG. Mildly elevated RIGHT hemidiaphragm with bibasilar strandy densities. No pleural effusion or focal consolidation. No pneumothorax. IMPRESSION: Stable cardiomegaly. Mild bronchitic changes. Bibasilar atelectasis. Electronically Signed   By: Elon Alas M.D.   On: 08/01/2017 17:39   Ct Head Wo Contrast  Result Date: 08/17/2017 CLINICAL DATA:  Altered mental status.  Fever of unknown origin. EXAM: CT HEAD WITHOUT CONTRAST TECHNIQUE: Contiguous axial images were obtained from the base of the skull through the vertex without intravenous contrast. COMPARISON:  Head CT 05/31/2017 FINDINGS: Brain: Mild generalized atrophy, unchanged from prior exam. No intracranial hemorrhage, mass effect, or midline shift. No hydrocephalus. Cavum septum pellucidum, normal variant. The basilar cisterns are patent. No evidence of territorial infarct or acute ischemia. No extra-axial or intracranial fluid collection. Vascular: Atherosclerosis of skullbase vasculature without hyperdense vessel or abnormal calcification. Skull: Normal. Negative for fracture or focal lesion. Sinuses/Orbits: Paranasal sinuses and mastoid air cells are clear. The visualized orbits are unremarkable. Other: None. IMPRESSION: 1.  No acute intracranial abnormality. 2. Stable generalized atrophy. Electronically Signed   By: Jeb Levering M.D.   On: 08/17/2017 02:48   Ct Soft Tissue Neck W Contrast  Result Date: 08/17/2017 CLINICAL DATA:  Initial evaluation for acute fever of unknown origin. EXAM: CT NECK WITH CONTRAST TECHNIQUE: Multidetector CT imaging of the neck was performed using the standard protocol following the bolus administration of intravenous contrast. CONTRAST:  15mL ISOVUE-300  IOPAMIDOL (ISOVUE-300) INJECTION 61% COMPARISON:  None. FINDINGS: Pharynx and larynx: Study limited by motion artifact. Oral cavity within normal limits without mass lesion or loculated fluid collection. No acute abnormality about the dentition. Palatine tonsils symmetric and within normal limits. No tonsillar or peritonsillar collection. Parapharyngeal fat preserved. Nasopharynx within normal limits. No appreciable retropharyngeal collection. Epiglottis grossly normal. Vallecula largely effaced by the lingual tonsils. Remainder of the hypopharynx and supraglottic larynx within normal limits. True cords symmetric and normal. Subglottic airway clear. Salivary glands: Salivary glands demonstrate no acute abnormality. Thyroid: Thyroid normal. Lymph nodes: No adenopathy within the neck. Vascular: Extensive atherosclerosis present about the aortic arch and carotid bifurcations. Normal intravascular enhancement seen throughout the neck. Limited intracranial: Visualized brain within normal limits. Visualized orbits: Visualized globes and orbital soft tissues unremarkable. Mastoids and visualized paranasal sinuses: Partially visualized paranasal sinuses are clear. Partially visualize mastoids and middle ear cavities are clear. Skeleton: No acute osseus abnormality. No worrisome lytic or blastic osseous lesions. Median sternotomy wires noted. Upper chest: Visualized upper chest demonstrates no acute abnormality. Mild scattered atelectatic  changes noted within the visualized lungs. Partially visualized lungs are otherwise clear. Other: None. IMPRESSION: 1. No CT evidence for acute abnormality within the neck. No acute inflammatory changes or other finding to explain patient's symptoms. 2. Advanced atherosclerosis. Electronically Signed   By: Jeannine Boga M.D.   On: 08/17/2017 02:46   Ct Chest W Contrast  Result Date: 08/17/2017 CLINICAL DATA:  Fever of unknown origin. EXAM: CT CHEST, ABDOMEN, AND PELVIS WITH  CONTRAST TECHNIQUE: Multidetector CT imaging of the chest, abdomen and pelvis was performed following the standard protocol during bolus administration of intravenous contrast. CONTRAST:  37mL ISOVUE-300 IOPAMIDOL (ISOVUE-300) INJECTION 61% COMPARISON:  CT chest 01/19/2017.  CT abdomen and pelvis 02/02/2005 FINDINGS: CT CHEST FINDINGS Cardiovascular: Normal heart size. No pericardial effusion. Normal caliber thoracic aorta. Aortic calcifications. Postoperative changes consistent with coronary bypass. Mediastinum/Nodes: No significant lymphadenopathy in the chest. Esophagus is decompressed. Thyroid gland is unremarkable. Lungs/Pleura: Mild dependent changes in the lungs. Previous lung infiltrates are improving. No focal consolidation. No pleural effusions. No pneumothorax. Airways are patent. Musculoskeletal: Postoperative median sternotomy. No destructive bone lesions. CT ABDOMEN PELVIS FINDINGS Hepatobiliary: No focal liver abnormality is seen. No gallstones, gallbladder wall thickening, or biliary dilatation. Pancreas: Unremarkable. No pancreatic ductal dilatation or surrounding inflammatory changes. Spleen: Normal in size without focal abnormality. Adrenals/Urinary Tract: No adrenal gland nodules. Small stones in the upper poles of both kidneys. Bilateral renal cysts. Prominent vascular calcification along the renal arteries and intrarenal arteries. Calcification at the origin of the renal artery's may result in renal artery stenosis. Nephrograms are symmetrical. No hydronephrosis or hydroureter. Bladder is distended, possibly indicating urine retention. No bladder wall thickening. Stomach/Bowel: Diverticulosis of the sigmoid colon. There is a large pericolonic collection adjacent to the sigmoid region in the left lower quadrant. The collection has a thickened wall and contains stool and gas. This collection measures about 5.9 cm in diameter. There is mild inflammatory stranding in the fat around the collection.  There is direct communication with the colon. This most likely represents localized perforation of diverticulitis with a diverticular abscess. A perforated colon cancer could also have this appearance and follow-up imaging after resolution of the acute process is recommended to exclude underlying colon cancer. Scattered stool throughout the remainder the colon. No evidence of colonic or small bowel obstruction. Stomach is decompressed. Appendix is normal. Vascular/Lymphatic: Extensive calcification of the aorta and branch vessels. Prominent calcifications may result in stenosis of the celiac axis, superior mesenteric artery, inferior mesenteric artery, and common iliac and external iliac arteries bilaterally. Poor contrast bolus limits evaluation of the arterial flow. There is a femoral femoral bypass graft. Reproductive: Prostate gland is enlarged. Other: No free air or free fluid in the abdomen. Musculoskeletal: No acute or significant osseous findings. IMPRESSION: 1. No evidence of active pulmonary disease. 2. Gas and stool-filled thick walled collection in the left lower quadrant measuring 5.9 cm diameter and communicating with sigmoid colon. This is consistent with an abscess, likely indicating diverticulitis with diverticular abscess. A less likely consideration would be a perforated colon cancer. Colonic evaluation is recommended after resolution of the acute process to exclude an underlying colon neoplasm. 3. Aortic atherosclerosis and extensive calcific atherosclerotic changes throughout the abdominal vasculature. Probable calcific stenosis at multiple vessels. Electronically Signed   By: Lucienne Capers M.D.   On: 08/17/2017 03:02   Ct Abdomen Pelvis W Contrast  Result Date: 08/17/2017 CLINICAL DATA:  Fever of unknown origin. EXAM: CT CHEST, ABDOMEN, AND PELVIS WITH CONTRAST TECHNIQUE: Multidetector  CT imaging of the chest, abdomen and pelvis was performed following the standard protocol during bolus  administration of intravenous contrast. CONTRAST:  57mL ISOVUE-300 IOPAMIDOL (ISOVUE-300) INJECTION 61% COMPARISON:  CT chest 01/19/2017.  CT abdomen and pelvis 02/02/2005 FINDINGS: CT CHEST FINDINGS Cardiovascular: Normal heart size. No pericardial effusion. Normal caliber thoracic aorta. Aortic calcifications. Postoperative changes consistent with coronary bypass. Mediastinum/Nodes: No significant lymphadenopathy in the chest. Esophagus is decompressed. Thyroid gland is unremarkable. Lungs/Pleura: Mild dependent changes in the lungs. Previous lung infiltrates are improving. No focal consolidation. No pleural effusions. No pneumothorax. Airways are patent. Musculoskeletal: Postoperative median sternotomy. No destructive bone lesions. CT ABDOMEN PELVIS FINDINGS Hepatobiliary: No focal liver abnormality is seen. No gallstones, gallbladder wall thickening, or biliary dilatation. Pancreas: Unremarkable. No pancreatic ductal dilatation or surrounding inflammatory changes. Spleen: Normal in size without focal abnormality. Adrenals/Urinary Tract: No adrenal gland nodules. Small stones in the upper poles of both kidneys. Bilateral renal cysts. Prominent vascular calcification along the renal arteries and intrarenal arteries. Calcification at the origin of the renal artery's may result in renal artery stenosis. Nephrograms are symmetrical. No hydronephrosis or hydroureter. Bladder is distended, possibly indicating urine retention. No bladder wall thickening. Stomach/Bowel: Diverticulosis of the sigmoid colon. There is a large pericolonic collection adjacent to the sigmoid region in the left lower quadrant. The collection has a thickened wall and contains stool and gas. This collection measures about 5.9 cm in diameter. There is mild inflammatory stranding in the fat around the collection. There is direct communication with the colon. This most likely represents localized perforation of diverticulitis with a diverticular  abscess. A perforated colon cancer could also have this appearance and follow-up imaging after resolution of the acute process is recommended to exclude underlying colon cancer. Scattered stool throughout the remainder the colon. No evidence of colonic or small bowel obstruction. Stomach is decompressed. Appendix is normal. Vascular/Lymphatic: Extensive calcification of the aorta and branch vessels. Prominent calcifications may result in stenosis of the celiac axis, superior mesenteric artery, inferior mesenteric artery, and common iliac and external iliac arteries bilaterally. Poor contrast bolus limits evaluation of the arterial flow. There is a femoral femoral bypass graft. Reproductive: Prostate gland is enlarged. Other: No free air or free fluid in the abdomen. Musculoskeletal: No acute or significant osseous findings. IMPRESSION: 1. No evidence of active pulmonary disease. 2. Gas and stool-filled thick walled collection in the left lower quadrant measuring 5.9 cm diameter and communicating with sigmoid colon. This is consistent with an abscess, likely indicating diverticulitis with diverticular abscess. A less likely consideration would be a perforated colon cancer. Colonic evaluation is recommended after resolution of the acute process to exclude an underlying colon neoplasm. 3. Aortic atherosclerosis and extensive calcific atherosclerotic changes throughout the abdominal vasculature. Probable calcific stenosis at multiple vessels. Electronically Signed   By: Lucienne Capers M.D.   On: 08/17/2017 03:02   Dg Chest Portable 1 View  Result Date: 08/16/2017 CLINICAL DATA:  Initial evaluation for code sepsis. EXAM: PORTABLE CHEST 1 VIEW COMPARISON:  Right radiograph from 08/01/2017. FINDINGS: Median sternotomy wires with underlying CABG markers and surgical clips. Stable cardiomegaly. Mediastinal silhouette normal. Aortic atherosclerosis. Lungs hypoinflated. Mild diffuse pulmonary vascular congestion without  frank pulmonary edema. No focal infiltrates. No appreciable pleural effusion. No pneumothorax. No acute osseous abnormality. IMPRESSION: 1. Cardiomegaly with mild diffuse pulmonary vascular congestion without overt pulmonary edema. 2. No other active cardiopulmonary disease. No focal infiltrates identified. Electronically Signed   By: Jeannine Boga M.D.   On:  08/16/2017 22:49   US Abdomen Limited Ruq  Result Date: 08/02/2017 CLINICAL DATA:  Fever and bacteremia of unknown source. EXAM: ULTRASOUND ABDOMEN LIMITED RIGHT UPPER QUADRANT COMPARISON:  No prior ultrasound. PET-CT 11/30/2015 and CT abdomen 02/02/2005. FINDINGS: Gallbladder: No shadowing gallstones or echogenic sludge. Approximate 4 mm polyp arising from the anterior wall of the gallbladder body. No gallbladder wall thickening or pericholecystic fluid. Negative sonographic Murphy's sign according to the ultrasound technologist. Common bile duct: Diameter: Approximately 5 mm centrally. The distal duct is obscured by duodenal bowel gas. Liver: Normal size and echotexture without focal parenchymal abnormality. Portal vein is patent on color Doppler imaging with normal direction of blood flow towards the liver. IMPRESSION: 1. No evidence of cholelithiasis or cholecystitis. No biliary ductal dilation. 2. Benign 4 mm polyp involving the anterior wall of the body of the gallbladder. 3. Normal appearing liver. Electronically Signed   By: Evangeline Dakin M.D.   On: 08/02/2017 15:15    Time Spent in minutes  25  Baylin Gamblin M.D on 08/18/2017 at 2:15 PM  Between 7am to 7pm - Pager - [phone]  After 7pm go to [email or web address] - password [facility]  [facility] -  Office  [phone]

## 2017-08-19 DIAGNOSIS — K5732 Diverticulitis of large intestine without perforation or abscess without bleeding: Secondary | ICD-10-CM | POA: Diagnosis present

## 2017-08-19 LAB — GLUCOSE, CAPILLARY: Glucose-Capillary: 146 mg/dL — ABNORMAL HIGH (ref 65–99)

## 2017-08-19 MED ORDER — SACCHAROMYCES BOULARDII 250 MG PO CAPS: 250.0000 mg | ORAL_CAPSULE | Freq: Two times a day (BID) | ORAL | 0 refills | Status: AC

## 2017-08-19 MED ORDER — PANTOPRAZOLE SODIUM 40 MG PO TBEC: 40.0000 mg | DELAYED_RELEASE_TABLET | Freq: Every day | ORAL | 0 refills | Status: DC

## 2017-08-19 MED ORDER — CIPROFLOXACIN HCL 500 MG PO TABS: 500.0000 mg | ORAL_TABLET | Freq: Two times a day (BID) | ORAL | 0 refills | Status: AC

## 2017-08-19 MED ORDER — METRONIDAZOLE 500 MG PO TABS: 500.0000 mg | ORAL_TABLET | Freq: Three times a day (TID) | ORAL | 0 refills | Status: AC

## 2017-08-19 NOTE — Discharge Instructions (Signed)
Diverticulitis °Diverticulitis is when small pockets in your large intestine (colon) get infected or swollen. This causes stomach pain and watery poop (diarrhea). °These pouches are called diverticula. They form in people who have a condition called diverticulosis. °Follow these instructions at home: °Medicines °· Take over-the-counter and prescription medicines only as told by your doctor. These include: °? Antibiotics. °? Pain medicines. °? Fiber pills. °? Probiotics. °? Stool softeners. °· Do not drive or use heavy machinery while taking prescription pain medicine. °· If you were prescribed an antibiotic, take it as told. Do not stop taking it even if you feel better. °General instructions °· Follow a diet as told by your doctor. °· When you feel better, your doctor may tell you to change your diet. You may need to eat a lot of fiber. Fiber makes it easier to poop (have bowel movements). Healthy foods with fiber include: °? Berries. °? Beans. °? Lentils. °? Green vegetables. °· Exercise 3 or more times a week. Aim for 30 minutes each time. Exercise enough to sweat and make your heart beat faster. °· Keep all follow-up visits as told. This is important. You may need to have an exam of the large intestine. This is called a colonoscopy. °Contact a doctor if: °· Your pain does not get better. °· You have a hard time eating or drinking. °· You are not pooping like normal. °Get help right away if: °· Your pain gets worse. °· Your problems do not get better. °· Your problems get worse very fast. °· You have a fever. °· You throw up (vomit) more than one time. °· You have poop that is: °? Bloody. °? Black. °? Tarry. °Summary °· Diverticulitis is when small pockets in your large intestine (colon) get infected or swollen. °· Take medicines only as told by your doctor. °· Follow a diet as told by your doctor. °This information is not intended to replace advice given to you by your health care provider. Make sure you discuss  any questions you have with your health care provider. °Document Released: 01/17/2008 Document Revised: 08/17/2016 Document Reviewed: 08/17/2016 °[employer] Interactive Patient Education © 2017 [employer] ° °

## 2017-08-19 NOTE — Discharge Summary (Signed)
Physician Discharge Summary  [name] XWR:[id number] DOB: 05/29/1946 DOA: 08/16/2017  PCP: [name], MD  Admit date: 08/16/2017 Discharge date: 08/19/2017  Admitted From: Home Disposition:  Home  Recommendations for Outpatient Follow-up:  1. Follow up with PCP in 1-2 weeks 2. Patient being discharged on ciprofloxacin and Flagyl to complete ten-day course of antibiotics. (Stop date 1/14) 3. Patient should follow-up with GI Dr. Sydell Axon for colonoscopy as outpatient.  Home Health: None Equipment/Devices: None  Discharge Condition: Fair CODE STATUS: Full code Diet recommendation: Heart Healthy / Carb Modified     Discharge Diagnoses:  Principal Problem:   Sepsis ([place])  Active Problems:   Diverticulitis large intestine   Essential hypertension   Neuropathy, peripheral   HLD (hyperlipidemia)   Type 2 diabetes mellitus (HCC)   PAD (peripheral artery disease) (HCC)   CKD (chronic kidney disease) stage 3, GFR 30-59 ml/min (HCC)  Brief narrative/history of present illness Please refer to admission H&P for details, in brief, 72 year old male with coronary artery disease/CABG, prosthetic heart valve placed in 2016, type 2 diabetes mellitus, hypertension or peripheral vascular disease, type 2 diabetes mellitus with peripheral neuropathy and multiple hospitalization recently for sepsis of unclear etiology presented again with fevers or chills associated with some nausea and near syncopal episode for past few days. Patient previously hospitalized with pneumonia, UTI, bacteremia with Escherichia coli and Enterobacter and recent hospitalization 2 weeks back with sepsis of unclear etiology. The ED he was septic with significant leukocytosis and elevated lactic acid. CT of the abdomen and pelvis showing sigmoid diverticulosis with 6 cm collection suggestive of possible localized perforation of diverticulitis with  suspicion for a diverticular abscess. Patient placed on broad-spectrum  antibiotics .   Hospital course   Principal Problem:   Sepsis ([place]) Possibly due to sigmoid diverticulitis. Discussed with IR who reviewed abdominal CT and do not of the that he has a sigmoid abscess and do not feel comfortable putting a drain with the risk of colonic perforation. Discussed with surgery who agree with the same. Sepsis has now resolved. Blood cultures without any growth. Patient does not have any hardware or pacemaker could be source of infection Patient will need colonoscopy as outpatient once acute infection has resolved. Will arrange outpatient follow-up with his gastroenterologist Dr. Sydell Axon. -Patient will be discharged on oral ciprofloxacin and Flagyl to complete ten-day course of antibiotics. Also added probiotic.  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 Continue home dose Lantus with sliding scale coverage.  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  Diabetic neuropathy Continue gabapentin.     CKD (chronic kidney disease) stage 3, GFR 30-59 ml/min (HCC) Renal function at baseline.         Code Status : Full code  Family Communication  : Wife at bedside  Disposition Plan  : Home   Consults  :   IR Gen. surgery  Procedures  :  CT head, neck, chest, abdomen and pelvis       Discharge Instructions   Allergies as of 08/19/2017      Reactions   No Known Allergies       Medication List    TAKE these medications   aspirin 81 MG tablet Take 81 mg by mouth daily.   cilostazol 100 MG tablet Commonly known as:  PLETAL Take 100 mg by mouth 2 (two) times daily.   ciprofloxacin 500  MG tablet Commonly known as:  CIPRO Take 1 tablet (500 mg total) by mouth 2 (two) times daily for 8 days.   ferrous sulfate 325 (65 FE) MG tablet Take 325 mg by mouth daily.   gabapentin 300 MG  capsule Commonly known as:  NEURONTIN Take 300-600 mg by mouth 2 (two) times daily. 300mg  in the morning and 600mg  at bedtime   insulin glargine 100 UNIT/ML injection Commonly known as:  LANTUS Inject 0.2 mLs (20 Units total) into the skin at bedtime.   metroNIDAZOLE 500 MG tablet Commonly known as:  FLAGYL Take 1 tablet (500 mg total) by mouth 3 (three) times daily for 8 days.   pantoprazole 40 MG tablet Commonly known as:  PROTONIX Take 1 tablet (40 mg total) by mouth daily. What changed:    how much to take  when to take this   saccharomyces boulardii 250 MG capsule Commonly known as:  FLORASTOR Take 1 capsule (250 mg total) by mouth 2 (two) times daily for 10 days.   simvastatin 40 MG tablet Commonly known as:  ZOCOR Take 20 mg by mouth at bedtime.   Vitamin D 2000 units tablet Take 2,000 Units by mouth daily.      Follow-up Information    [name], MD. Schedule an appointment as soon as possible for a visit in 1 week(s).   Specialty:  Nurse Practitioner Contact information: [place] [address] [phone]        Daneil Dolin, MD. Schedule an appointment as soon as possible for a visit in 2 week(s).   Specialty:  Gastroenterology Contact information: [place] [address] [phone]          Allergies  Allergen Reactions  . No Known Allergies        Procedures/Studies: Dg Chest 2 View  Result Date: 08/01/2017 CLINICAL DATA:  Lethargy, fever and weakness today. History of hypertension, CABG. EXAM: CHEST  2 VIEW COMPARISON:  Chest radiograph May 31, 2017 FINDINGS: Cardiac silhouette is at least moderately enlarged. Calcified aortic knob. Mild bronchitic changes. Status post median sternotomy for CABG. Mildly elevated RIGHT hemidiaphragm with bibasilar strandy densities. No pleural effusion or focal consolidation. No pneumothorax. IMPRESSION: Stable cardiomegaly. Mild bronchitic changes.  Bibasilar atelectasis. Electronically Signed   By: Elon Alas M.D.   On: 08/01/2017 17:39   Ct Head Wo Contrast  Result Date: 08/17/2017 CLINICAL DATA:  Altered mental status.  Fever of unknown origin. EXAM: CT HEAD WITHOUT CONTRAST TECHNIQUE: Contiguous axial images were obtained from the base of the skull through the vertex without intravenous contrast. COMPARISON:  Head CT 05/31/2017 FINDINGS: Brain: Mild generalized atrophy, unchanged from prior exam. No intracranial hemorrhage, mass effect, or midline shift. No hydrocephalus. Cavum septum pellucidum, normal variant. The basilar cisterns are patent. No evidence of territorial infarct or acute ischemia. No extra-axial or intracranial fluid collection. Vascular: Atherosclerosis of skullbase vasculature without hyperdense vessel or abnormal calcification. Skull: Normal. Negative for fracture or focal lesion. Sinuses/Orbits: Paranasal sinuses and mastoid air cells are clear. The visualized orbits are unremarkable. Other: None. IMPRESSION: 1.  No acute intracranial abnormality. 2. Stable generalized atrophy. Electronically Signed   By: Jeb Levering M.D.   On: 08/17/2017 02:48   Ct Soft Tissue Neck W Contrast  Result Date: 08/17/2017 CLINICAL DATA:  Initial evaluation for acute fever of unknown origin. EXAM: CT NECK WITH CONTRAST TECHNIQUE: Multidetector CT imaging of the neck was performed using the standard protocol following the bolus  administration of intravenous contrast. CONTRAST:  50mL ISOVUE-300 IOPAMIDOL (ISOVUE-300) INJECTION 61% COMPARISON:  None. FINDINGS: Pharynx and larynx: Study limited by motion artifact. Oral cavity within normal limits without mass lesion or loculated fluid collection. No acute abnormality about the dentition. Palatine tonsils symmetric and within normal limits. No tonsillar or peritonsillar collection. Parapharyngeal fat preserved. Nasopharynx within normal limits. No appreciable retropharyngeal collection.  Epiglottis grossly normal. Vallecula largely effaced by the lingual tonsils. Remainder of the hypopharynx and supraglottic larynx within normal limits. True cords symmetric and normal. Subglottic airway clear. Salivary glands: Salivary glands demonstrate no acute abnormality. Thyroid: Thyroid normal. Lymph nodes: No adenopathy within the neck. Vascular: Extensive atherosclerosis present about the aortic arch and carotid bifurcations. Normal intravascular enhancement seen throughout the neck. Limited intracranial: Visualized brain within normal limits. Visualized orbits: Visualized globes and orbital soft tissues unremarkable. Mastoids and visualized paranasal sinuses: Partially visualized paranasal sinuses are clear. Partially visualize mastoids and middle ear cavities are clear. Skeleton: No acute osseus abnormality. No worrisome lytic or blastic osseous lesions. Median sternotomy wires noted. Upper chest: Visualized upper chest demonstrates no acute abnormality. Mild scattered atelectatic changes noted within the visualized lungs. Partially visualized lungs are otherwise clear. Other: None. IMPRESSION: 1. No CT evidence for acute abnormality within the neck. No acute inflammatory changes or other finding to explain patient's symptoms. 2. Advanced atherosclerosis. Electronically Signed   By: Jeannine Boga M.D.   On: 08/17/2017 02:46   Ct Chest W Contrast  Result Date: 08/17/2017 CLINICAL DATA:  Fever of unknown origin. EXAM: CT CHEST, ABDOMEN, AND PELVIS WITH CONTRAST TECHNIQUE: Multidetector CT imaging of the chest, abdomen and pelvis was performed following the standard protocol during bolus administration of intravenous contrast. CONTRAST:  28mL ISOVUE-300 IOPAMIDOL (ISOVUE-300) INJECTION 61% COMPARISON:  CT chest 01/19/2017.  CT abdomen and pelvis 02/02/2005 FINDINGS: CT CHEST FINDINGS Cardiovascular: Normal heart size. No pericardial effusion. Normal caliber thoracic aorta. Aortic calcifications.  Postoperative changes consistent with coronary bypass. Mediastinum/Nodes: No significant lymphadenopathy in the chest. Esophagus is decompressed. Thyroid gland is unremarkable. Lungs/Pleura: Mild dependent changes in the lungs. Previous lung infiltrates are improving. No focal consolidation. No pleural effusions. No pneumothorax. Airways are patent. Musculoskeletal: Postoperative median sternotomy. No destructive bone lesions. CT ABDOMEN PELVIS FINDINGS Hepatobiliary: No focal liver abnormality is seen. No gallstones, gallbladder wall thickening, or biliary dilatation. Pancreas: Unremarkable. No pancreatic ductal dilatation or surrounding inflammatory changes. Spleen: Normal in size without focal abnormality. Adrenals/Urinary Tract: No adrenal gland nodules. Small stones in the upper poles of both kidneys. Bilateral renal cysts. Prominent vascular calcification along the renal arteries and intrarenal arteries. Calcification at the origin of the renal artery's may result in renal artery stenosis. Nephrograms are symmetrical. No hydronephrosis or hydroureter. Bladder is distended, possibly indicating urine retention. No bladder wall thickening. Stomach/Bowel: Diverticulosis of the sigmoid colon. There is a large pericolonic collection adjacent to the sigmoid region in the left lower quadrant. The collection has a thickened wall and contains stool and gas. This collection measures about 5.9 cm in diameter. There is mild inflammatory stranding in the fat around the collection. There is direct communication with the colon. This most likely represents localized perforation of diverticulitis with a diverticular abscess. A perforated colon cancer could also have this appearance and follow-up imaging after resolution of the acute process is recommended to exclude underlying colon cancer. Scattered stool throughout the remainder the colon. No evidence of colonic or small bowel obstruction. Stomach is decompressed. Appendix is  normal. Vascular/Lymphatic: Extensive calcification of  the aorta and branch vessels. Prominent calcifications may result in stenosis of the celiac axis, superior mesenteric artery, inferior mesenteric artery, and common iliac and external iliac arteries bilaterally. Poor contrast bolus limits evaluation of the arterial flow. There is a femoral femoral bypass graft. Reproductive: Prostate gland is enlarged. Other: No free air or free fluid in the abdomen. Musculoskeletal: No acute or significant osseous findings. IMPRESSION: 1. No evidence of active pulmonary disease. 2. Gas and stool-filled thick walled collection in the left lower quadrant measuring 5.9 cm diameter and communicating with sigmoid colon. This is consistent with an abscess, likely indicating diverticulitis with diverticular abscess. A less likely consideration would be a perforated colon cancer. Colonic evaluation is recommended after resolution of the acute process to exclude an underlying colon neoplasm. 3. Aortic atherosclerosis and extensive calcific atherosclerotic changes throughout the abdominal vasculature. Probable calcific stenosis at multiple vessels. Electronically Signed   By: Lucienne Capers M.D.   On: 08/17/2017 03:02   Ct Abdomen Pelvis W Contrast  Result Date: 08/17/2017 CLINICAL DATA:  Fever of unknown origin. EXAM: CT CHEST, ABDOMEN, AND PELVIS WITH CONTRAST TECHNIQUE: Multidetector CT imaging of the chest, abdomen and pelvis was performed following the standard protocol during bolus administration of intravenous contrast. CONTRAST:  51mL ISOVUE-300 IOPAMIDOL (ISOVUE-300) INJECTION 61% COMPARISON:  CT chest 01/19/2017.  CT abdomen and pelvis 02/02/2005 FINDINGS: CT CHEST FINDINGS Cardiovascular: Normal heart size. No pericardial effusion. Normal caliber thoracic aorta. Aortic calcifications. Postoperative changes consistent with coronary bypass. Mediastinum/Nodes: No significant lymphadenopathy in the chest. Esophagus is  decompressed. Thyroid gland is unremarkable. Lungs/Pleura: Mild dependent changes in the lungs. Previous lung infiltrates are improving. No focal consolidation. No pleural effusions. No pneumothorax. Airways are patent. Musculoskeletal: Postoperative median sternotomy. No destructive bone lesions. CT ABDOMEN PELVIS FINDINGS Hepatobiliary: No focal liver abnormality is seen. No gallstones, gallbladder wall thickening, or biliary dilatation. Pancreas: Unremarkable. No pancreatic ductal dilatation or surrounding inflammatory changes. Spleen: Normal in size without focal abnormality. Adrenals/Urinary Tract: No adrenal gland nodules. Small stones in the upper poles of both kidneys. Bilateral renal cysts. Prominent vascular calcification along the renal arteries and intrarenal arteries. Calcification at the origin of the renal artery's may result in renal artery stenosis. Nephrograms are symmetrical. No hydronephrosis or hydroureter. Bladder is distended, possibly indicating urine retention. No bladder wall thickening. Stomach/Bowel: Diverticulosis of the sigmoid colon. There is a large pericolonic collection adjacent to the sigmoid region in the left lower quadrant. The collection has a thickened wall and contains stool and gas. This collection measures about 5.9 cm in diameter. There is mild inflammatory stranding in the fat around the collection. There is direct communication with the colon. This most likely represents localized perforation of diverticulitis with a diverticular abscess. A perforated colon cancer could also have this appearance and follow-up imaging after resolution of the acute process is recommended to exclude underlying colon cancer. Scattered stool throughout the remainder the colon. No evidence of colonic or small bowel obstruction. Stomach is decompressed. Appendix is normal. Vascular/Lymphatic: Extensive calcification of the aorta and branch vessels. Prominent calcifications may result in  stenosis of the celiac axis, superior mesenteric artery, inferior mesenteric artery, and common iliac and external iliac arteries bilaterally. Poor contrast bolus limits evaluation of the arterial flow. There is a femoral femoral bypass graft. Reproductive: Prostate gland is enlarged. Other: No free air or free fluid in the abdomen. Musculoskeletal: No acute or significant osseous findings. IMPRESSION: 1. No evidence of active pulmonary disease. 2. Gas and stool-filled thick  walled collection in the left lower quadrant measuring 5.9 cm diameter and communicating with sigmoid colon. This is consistent with an abscess, likely indicating diverticulitis with diverticular abscess. A less likely consideration would be a perforated colon cancer. Colonic evaluation is recommended after resolution of the acute process to exclude an underlying colon neoplasm. 3. Aortic atherosclerosis and extensive calcific atherosclerotic changes throughout the abdominal vasculature. Probable calcific stenosis at multiple vessels. Electronically Signed   By: Lucienne Capers M.D.   On: 08/17/2017 03:02   Dg Chest Portable 1 View  Result Date: 08/16/2017 CLINICAL DATA:  Initial evaluation for code sepsis. EXAM: PORTABLE CHEST 1 VIEW COMPARISON:  Right radiograph from 08/01/2017. FINDINGS: Median sternotomy wires with underlying CABG markers and surgical clips. Stable cardiomegaly. Mediastinal silhouette normal. Aortic atherosclerosis. Lungs hypoinflated. Mild diffuse pulmonary vascular congestion without frank pulmonary edema. No focal infiltrates. No appreciable pleural effusion. No pneumothorax. No acute osseous abnormality. IMPRESSION: 1. Cardiomegaly with mild diffuse pulmonary vascular congestion without overt pulmonary edema. 2. No other active cardiopulmonary disease. No focal infiltrates identified. Electronically Signed   By: Jeannine Boga M.D.   On: 08/16/2017 22:49   US Abdomen Limited Ruq  Result Date:  08/02/2017 CLINICAL DATA:  Fever and bacteremia of unknown source. EXAM: ULTRASOUND ABDOMEN LIMITED RIGHT UPPER QUADRANT COMPARISON:  No prior ultrasound. PET-CT 11/30/2015 and CT abdomen 02/02/2005. FINDINGS: Gallbladder: No shadowing gallstones or echogenic sludge. Approximate 4 mm polyp arising from the anterior wall of the gallbladder body. No gallbladder wall thickening or pericholecystic fluid. Negative sonographic Murphy's sign according to the ultrasound technologist. Common bile duct: Diameter: Approximately 5 mm centrally. The distal duct is obscured by duodenal bowel gas. Liver: Normal size and echotexture without focal parenchymal abnormality. Portal vein is patent on color Doppler imaging with normal direction of blood flow towards the liver. IMPRESSION: 1. No evidence of cholelithiasis or cholecystitis. No biliary ductal dilation. 2. Benign 4 mm polyp involving the anterior wall of the body of the gallbladder. 3. Normal appearing liver. Electronically Signed   By: Evangeline Dakin M.D.   On: 08/02/2017 15:15       Subjective: Denies any complaints. Remains afebrile.  Discharge Exam: Vitals:   08/18/17 2030 08/19/17 0501  BP: (!) 130/49 132/67  Pulse: 67 77  Resp: 18 18  Temp: 97.7 F (36.5 C) 97.6 F (36.4 C)  SpO2: 97% 99%   Vitals:   08/18/17 1400 08/18/17 1809 08/18/17 2030 08/19/17 0501  BP:  (!) 147/48 (!) 130/49 132/67  Pulse: 60 69 67 77  Resp: 17 20 18 18   Temp:  98 F (36.7 C) 97.7 F (36.5 C) 97.6 F (36.4 C)  TempSrc:  Oral Oral Oral  SpO2: 98% 99% 97% 99%  Weight:        Gen.: Elderly male not in distress HEENT: Moist mucosa, supple neck  chest: Clear bilaterally  CVS: Normal S1 and S2, no murmurs rub or gallop GI: Soft, nondistended, nontender, bowel sounds present Musculoskeletal : Warm, no edema     The results of significant diagnostics from this hospitalization (including imaging, microbiology, ancillary and laboratory) are listed below for  reference.     Microbiology: Recent Results (from the past 240 hour(s))  Culture, blood (Routine x 2)     Status: None (Preliminary result)   Collection Time: 08/16/17  9:23 PM  Result Value Ref Range Status   Specimen Description BLOOD RIGHT FOREARM  Final   Special Requests   Final    BOTTLES DRAWN AEROBIC  AND ANAEROBIC Blood Culture adequate volume   Culture NO GROWTH 3 DAYS  Final   Report Status PENDING  Incomplete  Culture, blood (Routine x 2)     Status: None (Preliminary result)   Collection Time: 08/16/17 10:05 PM  Result Value Ref Range Status   Specimen Description BLOOD LEFT ARM  Final   Special Requests   Final    BOTTLES DRAWN AEROBIC AND ANAEROBIC Blood Culture adequate volume   Culture NO GROWTH 3 DAYS  Final   Report Status PENDING  Incomplete  Respiratory Panel by PCR     Status: None   Collection Time: 08/17/17  5:40 PM  Result Value Ref Range Status   Adenovirus NOT DETECTED NOT DETECTED Final   Coronavirus 229E NOT DETECTED NOT DETECTED Final   Coronavirus HKU1 NOT DETECTED NOT DETECTED Final   Coronavirus NL63 NOT DETECTED NOT DETECTED Final   Coronavirus OC43 NOT DETECTED NOT DETECTED Final   Metapneumovirus NOT DETECTED NOT DETECTED Final   Rhinovirus / Enterovirus NOT DETECTED NOT DETECTED Final   Influenza A NOT DETECTED NOT DETECTED Final   Influenza B NOT DETECTED NOT DETECTED Final   Parainfluenza Virus 1 NOT DETECTED NOT DETECTED Final   Parainfluenza Virus 2 NOT DETECTED NOT DETECTED Final   Parainfluenza Virus 3 NOT DETECTED NOT DETECTED Final   Parainfluenza Virus 4 NOT DETECTED NOT DETECTED Final   Respiratory Syncytial Virus NOT DETECTED NOT DETECTED Final   Bordetella pertussis NOT DETECTED NOT DETECTED Final   Chlamydophila pneumoniae NOT DETECTED NOT DETECTED Final   Mycoplasma pneumoniae NOT DETECTED NOT DETECTED Final    Comment: Performed at [facility], [place] [address], [place],  [address]     Labs: BNP (last 3  results) Recent Labs    08/16/17 2123  BNP 00.8   Basic Metabolic Panel: Recent Labs  Lab 08/16/17 2123 08/17/17 0500 08/18/17 0452  NA 132* 137 136  K 4.8 3.9 4.1  CL 101 108 106  CO2 17* 20* 23  GLUCOSE 321* 96 100*  BUN 32* 26* 14  CREATININE 1.71* 1.44* 1.26*  CALCIUM 8.9 7.9* 8.4*   Liver Function Tests: Recent Labs  Lab 08/16/17 2123 08/17/17 0500  AST 17 11*  ALT 10* 8*  ALKPHOS 77 64  BILITOT 0.8 0.5  PROT 6.7 5.6*  ALBUMIN 3.4* 2.7*   No results for input(s): LIPASE, AMYLASE in the last 168 hours. No results for input(s): AMMONIA in the last 168 hours. CBC: Recent Labs  Lab 08/16/17 2123 08/18/17 0452  WBC 20.4* 6.7  NEUTROABS 18.9*  --   HGB 12.7* 12.5*  HCT 39.6 39.2  MCV 89.0 89.9  PLT 360 272   Cardiac Enzymes: Recent Labs  Lab 08/16/17 2123  TROPONINI <0.03   BNP: Invalid input(s): POCBNP CBG: Recent Labs  Lab 08/17/17 2256 08/18/17 1123 08/18/17 1625 08/18/17 2030 08/19/17 0719  GLUCAP 69 145* 173* 283* 146*   D-Dimer No results for input(s): DDIMER in the last 72 hours. Hgb A1c No results for input(s): HGBA1C in the last 72 hours. Lipid Profile No results for input(s): CHOL, HDL, LDLCALC, TRIG, CHOLHDL, LDLDIRECT in the last 72 hours. Thyroid function studies No results for input(s): TSH, T4TOTAL, T3FREE, THYROIDAB in the last 72 hours.  Invalid input(s): FREET3 Anemia work up No results for input(s): VITAMINB12, FOLATE, FERRITIN, TIBC, IRON, RETICCTPCT in the last 72 hours. Urinalysis    Component Value Date/Time   COLORURINE YELLOW 08/16/2017 [place]  08/16/2017 2358   LABSPEC 1.026 08/16/2017 2358   PHURINE 5.0 08/16/2017 2358   GLUCOSEU >=500 (A) 08/16/2017 2358   HGBUR NEGATIVE 08/16/2017 2358   BILIRUBINUR NEGATIVE 08/16/2017 2358   KETONESUR NEGATIVE 08/16/2017 2358   PROTEINUR NEGATIVE 08/16/2017 2358   UROBILINOGEN 0.2 03/16/2015 0120   NITRITE NEGATIVE 08/16/2017 2358   LEUKOCYTESUR  NEGATIVE 08/16/2017 2358   Sepsis Labs Invalid input(s): PROCALCITONIN,  WBC,  LACTICIDVEN Microbiology Recent Results (from the past 240 hour(s))  Culture, blood (Routine x 2)     Status: None (Preliminary result)   Collection Time: 08/16/17  9:23 PM  Result Value Ref Range Status   Specimen Description BLOOD RIGHT FOREARM  Final   Special Requests   Final    BOTTLES DRAWN AEROBIC AND ANAEROBIC Blood Culture adequate volume   Culture NO GROWTH 3 DAYS  Final   Report Status PENDING  Incomplete  Culture, blood (Routine x 2)     Status: None (Preliminary result)   Collection Time: 08/16/17 10:05 PM  Result Value Ref Range Status   Specimen Description BLOOD LEFT ARM  Final   Special Requests   Final    BOTTLES DRAWN AEROBIC AND ANAEROBIC Blood Culture adequate volume   Culture NO GROWTH 3 DAYS  Final   Report Status PENDING  Incomplete  Respiratory Panel by PCR     Status: None   Collection Time: 08/17/17  5:40 PM  Result Value Ref Range Status   Adenovirus NOT DETECTED NOT DETECTED Final   Coronavirus 229E NOT DETECTED NOT DETECTED Final   Coronavirus HKU1 NOT DETECTED NOT DETECTED Final   Coronavirus NL63 NOT DETECTED NOT DETECTED Final   Coronavirus OC43 NOT DETECTED NOT DETECTED Final   Metapneumovirus NOT DETECTED NOT DETECTED Final   Rhinovirus / Enterovirus NOT DETECTED NOT DETECTED Final   Influenza A NOT DETECTED NOT DETECTED Final   Influenza B NOT DETECTED NOT DETECTED Final   Parainfluenza Virus 1 NOT DETECTED NOT DETECTED Final   Parainfluenza Virus 2 NOT DETECTED NOT DETECTED Final   Parainfluenza Virus 3 NOT DETECTED NOT DETECTED Final   Parainfluenza Virus 4 NOT DETECTED NOT DETECTED Final   Respiratory Syncytial Virus NOT DETECTED NOT DETECTED Final   Bordetella pertussis NOT DETECTED NOT DETECTED Final   Chlamydophila pneumoniae NOT DETECTED NOT DETECTED Final   Mycoplasma pneumoniae NOT DETECTED NOT DETECTED Final    Comment: Performed at [facility]  Lab, [place] [address], [place], [place] [address]     Time coordinating discharge: Over 30 minutes  SIGNED:   Louellen Molder, MD  [facility] 08/19/2017, 9:51 AM Pager   If 7PM-7AM, please contact night-coverage [email or web address] Password [email or web address]

## 2017-08-20 ENCOUNTER — Encounter: Payer: Self-pay | Admitting: Internal Medicine

## 2017-08-21 LAB — CULTURE, BLOOD (ROUTINE X 2)
CULTURE: NO GROWTH
Culture: NO GROWTH
Special Requests: ADEQUATE
Special Requests: ADEQUATE

## 2017-09-10 ENCOUNTER — Encounter: Payer: Self-pay | Admitting: Surgery

## 2017-09-10 ENCOUNTER — Ambulatory Visit (INDEPENDENT_AMBULATORY_CARE_PROVIDER_SITE_OTHER)
Admission: RE | Admit: 2017-09-10 | Discharge: 2017-09-10 | Disposition: A | Discharge status: Home / Self Care | Payer: Medicare Other | Source: Ambulatory Visit | Attending: Surgery | Admitting: Surgery

## 2017-09-10 ENCOUNTER — Ambulatory Visit (HOSPITAL_COMMUNITY)
Admission: RE | Admit: 2017-09-10 | Discharge: 2017-09-10 | Disposition: A | Discharge status: Home / Self Care | Payer: Medicare Other | Source: Ambulatory Visit | Attending: Surgery | Admitting: Surgery

## 2017-09-10 ENCOUNTER — Ambulatory Visit: Payer: Medicare Other | Admitting: Surgery

## 2017-09-10 VITALS — BP 129/67 | HR 86 | Temp 97.7°F | Resp 16 | Ht 66.0 in | Wt 156.0 lb

## 2017-09-10 DIAGNOSIS — I70213 Atherosclerosis of native arteries of extremities with intermittent claudication, bilateral legs: Secondary | ICD-10-CM

## 2017-09-10 DIAGNOSIS — I739 Peripheral vascular disease, unspecified: Secondary | ICD-10-CM

## 2017-09-10 NOTE — Progress Notes (Signed)
HISTORY AND PHYSICAL     CC:  Check up Requesting Provider:  [name], MD  HPI: This is a 72 y.o. male who has a history of a right to left femoral-femoral bypass graft by Dr. Amedeo Plenty in the remote past.  He is also status post left femoral-popliteal bypass grafting and stenting to his right superficial femoral artery, both of which have occluded.  He developed progressive symptoms.  On angiography he was found to have a high-grade right common femoral stenosis near the anastomosis of the aortobifemoral bypass graft.  Therefore on 08/09/2016 he underwent right iliofemoral endarterectomy with patch angioplasty.  The femoral-femoral graft was reimplanted onto the patch.  The patient did well initially but quickly developed recurrent symptoms.  His ABIs actually decreased.  And therefore on February 6 he underwent angiography.  This identified a high-grade stenosis within the right external iliac and common femoral artery just proximal to the repair.  This was initially treated with balloon angioplasty which did not yield an optimal result and therefore the proximal common femoral and right external iliac artery were primarily stented using an 8 x 60 self-expanding stent.  He returns today for follow up.  He states that the cramping in his calves have gotten better and he is able to walk to the mailbox without his legs cramping.  He denies any non healing wounds on his feet.    He takes a daily aspirin.  The pt is on a statin for cholesterol management.  He is on insulin for diabetes.   Past Medical History:  Diagnosis Date  . CAD S/P percutaneous coronary angioplasty 1998   PCI TO CX  . Diabetes mellitus without complication ([place])   . GERD (gastroesophageal reflux disease)   . Headache   . 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 Hypercholesteremia   . Hypertension   . Neuropathy   . Peripheral vascular disease ([place])    s/p R-L FEM-FEM BYPASS  . Tachycardia    after CABG, pt on medicine for  this    Past Surgical History:  Procedure Laterality Date  . ABDOMINAL AORTOGRAM Bilateral 09/19/2016   Procedure: iliac;  Surgeon: Serafina Mitchell, MD;  Location: [place] [facility];  Service: Cardiovascular;  Laterality: Bilateral;  . CARDIAC CATHETERIZATION  2003   with stent  . CARDIAC CATHETERIZATION N/A 10/11/2015   Procedure: Left Heart Cath and Coronary Angiography;  Surgeon: Peter M Martinique, MD;  Location: [place] [facility];  Service: Cardiovascular;  Laterality: N/A;  . COLONOSCOPY N/A 09/22/2013   Procedure: COLONOSCOPY;  Surgeon: Daneil Dolin, MD;  [facility];  Service: Endoscopy;  Laterality: N/A;  9:30 AM  . CORONARY ARTERY BYPASS GRAFT N/A 10/18/2015   Procedure: CORONARY ARTERY BYPASS GRAFTING (CABG) x  five, using left internal mammary artery and right leg greater saphenous vein harvested endoscopically;  Surgeon: [name], MD;  Location: [place];  Service: Open Heart Surgery;  Laterality: N/A;  . ENDARTERECTOMY FEMORAL Right 08/09/2016   Procedure: ENDARTERECTOMY FEMORAL WITH VEIN PATCH ANGIOPLASTY;  Surgeon: Serafina Mitchell, MD;  Location: [place];  Service: Vascular;  Laterality: Right;  . FEMORAL-FEMORAL BYPASS GRAFT Bilateral 08/09/2016   Procedure: REVISION BYPASS GRAFT RIGHT FEMORAL-LEFT FEMORAL ARTERY;  Surgeon: Serafina Mitchell, MD;  Location: [place];  Service: Vascular;  Laterality: Bilateral;  . FEMORAL-POPLITEAL BYPASS GRAFT    . PERIPHERAL VASCULAR CATHETERIZATION N/A 05/08/2016   Procedure: Lower Extremity Angiography;  Surgeon: Roderic Palau  Adora Fridge, MD;  Location: [place] [facility];  Service: Cardiovascular;  Laterality: N/A;  . PERIPHERAL VASCULAR INTERVENTION Right 09/19/2016   Procedure: Peripheral Vascular Intervention;  Surgeon: Serafina Mitchell, MD;  Location: [place] [facility];  Service: Cardiovascular;  Laterality: Right;  ext iliac stent  . TEE WITHOUT CARDIOVERSION N/A 10/18/2015   Procedure: TRANSESOPHAGEAL ECHOCARDIOGRAM (TEE);  Surgeon:  [name], MD;  Location: [place];  Service: Open Heart Surgery;  Laterality: N/A;    Allergies  Allergen Reactions  . No Known Allergies     Current Outpatient Medications  Medication Sig Dispense Refill  . aspirin 81 MG tablet Take 81 mg by mouth daily.    . Cholecalciferol (VITAMIN D) 2000 UNITS tablet Take 2,000 Units by mouth daily.    . cilostazol (PLETAL) 100 MG tablet Take 100 mg by mouth 2 (two) times daily.    . ferrous sulfate 325 (65 FE) MG tablet Take 325 mg by mouth daily.     [name] gabapentin (NEURONTIN) 300 MG capsule Take 300-600 mg by mouth 2 (two) times daily. 300mg  in the morning and 600mg  at bedtime    . insulin glargine (LANTUS) 100 UNIT/ML injection Inject 0.2 mLs (20 Units total) into the skin at bedtime. 10 mL 11  . pantoprazole (PROTONIX) 40 MG tablet Take 1 tablet (40 mg total) by mouth daily. 5 tablet 0  . simvastatin (ZOCOR) 40 MG tablet Take 20 mg by mouth at bedtime.      No current facility-administered medications for this visit.     Family History  Problem Relation Age of Onset  . Heart attack Mother   . Stroke Mother   . Colon cancer Neg Hx     Social History   Socioeconomic History  . Marital status: Married    Spouse name: Not on file  . Number of children: Not on file  . Years of education: Not on file  . Highest education level: Not on file  Social Needs  . Financial resource strain: Not on file  . Food insecurity - worry: Not on file  . Food insecurity - inability: Not on file  . Transportation needs - medical: Not on file  . Transportation needs - non-medical: Not on file  Occupational History  . Not on file  Tobacco Use  . Smoking status: Former Smoker    Packs/day: 2.00    Years: 30.00    Pack years: 60.00    Types: Cigarettes    Last attempt to quit: 08/14/2000    Years since quitting: 17.0  . Smokeless tobacco: Never Used  Substance and Sexual Activity  . Alcohol use: No  . Drug use: No  . Sexual activity: Not on  file  Other Topics Concern  . Not on file  Social History Narrative  . Not on file     REVIEW OF SYSTEMS:   [X]  denotes positive finding, [ ]  denotes negative finding Cardiac  Comments:  Chest pain or chest pressure:    Shortness of breath upon exertion:    Short of breath when lying flat:    Irregular heart rhythm:        Vascular    Pain in calf, thigh, or hip brought on by ambulation: x Improved.   Pain in feet at night that wakes you up from your sleep:     Blood clot in your veins:    Leg swelling:         Pulmonary  Oxygen at home:    Productive cough:     Wheezing:         Neurologic    Sudden weakness in arms or legs:     Sudden numbness in arms or legs:     Sudden onset of difficulty speaking or slurred speech:    Temporary loss of vision in one eye:     Problems with dizziness:         Gastrointestinal    Blood in stool:     Vomited blood:         Genitourinary    Burning when urinating:     Blood in urine:        Psychiatric    Major depression:         Hematologic    Bleeding problems:    Problems with blood clotting too easily:        Skin    Rashes or ulcers:        Constitutional    Fever or chills:      PHYSICAL EXAMINATION:  Vitals:   09/10/17 1542  BP: 129/67  Pulse: 86  Resp: 16  Temp: 97.7 F (36.5 C)  SpO2: 96%   Vitals:   09/10/17 1542  Weight: 156 lb (70.8 kg)  Height: 5\' 6"  (1.676 m)   Body mass index is 25.18 kg/m.  General:  WDWN in NAD; vital signs documented above Gait: Not observed HENT: WNL, normocephalic Pulmonary: normal non-labored breathing , without Rales, rhonchi,  wheezing Cardiac: regular HR Abdomen: soft Skin: without rashes Vascular Exam/Pulses: Easily palpable fem fem graft pulse.  + palpable femoral pulses bilaterally  Bilateral feet are without wounds Extremities: without ischemic changes, without Gangrene , without cellulitis; without open wounds;  Musculoskeletal: no muscle wasting  or atrophy  Neurologic: A&O X 3;  No focal weakness or paresthesias are detected Psychiatric:  The pt has Normal affect.   Non-Invasive Vascular Imaging:   ABI's on 09/10/17: Right:  0.71 Left:  0.91  Previous ABI's on 10-30-16: Right:  0.62 Left:  0.80  Lower extremity arterial duplex 09/10/17: Patent right to left femoral to femoral bypass graft with no evidence for restenosis, the right EIA stent appears patent.   Pt meds includes: Statin:  Yes.   Beta Blocker:  No. Aspirin:  Yes.   ACEI:  No. ARB:  No. CCB use:  No Other Antiplatelet/Anticoagulant:  No   ASSESSMENT/PLAN:: 72 y.o. male with hx of right to left femoral to femoral bypass graft by Dr. Amedeo Plenty in the past and right ilio-femoral endarterectomy with patch angioplasty right CFA and re-do right limb of the right to left fem fem bypass on 08/09/16 by Dr. Trula Slade and subsequent angioplasty right CFA, stenting of the right EIA, CFA on 09/19/16 here for follow up   -pt doing well and claudication symptoms have dramatically improved.  His ABI's are also improved today and duplex reveals that his femoral to femoral bypass graft is patent.  -continue walking program -will f/u with Dr. Trula Slade in 6 months with repeat studies. He will call sooner if he has any issues.    Leontine Locket, PA-C [facility] [phone]  Clinic MD:  Pt seen and examined with Dr. Trula Slade  I agree.  I have seen and evaluated the patient.  He has a palpable femoral femoral graft pulse.  His symptoms of claudication are improved.  His ultrasound shows no significant stenosis.  He will follow-up in 6 months with repeat  vascular lab studies.  [name]

## 2017-09-11 NOTE — Addendum Note (Signed)
Addended by: Lianne Cure A on: 09/11/2017 03:19 PM   Modules accepted: Orders

## 2017-09-12 ENCOUNTER — Encounter: Payer: Self-pay | Admitting: Nurse Practitioner

## 2017-09-12 ENCOUNTER — Encounter: Payer: Self-pay | Admitting: *Deleted

## 2017-09-12 ENCOUNTER — Other Ambulatory Visit: Payer: Self-pay | Admitting: *Deleted

## 2017-09-12 ENCOUNTER — Ambulatory Visit: Payer: Medicare Other | Admitting: Nurse Practitioner

## 2017-09-12 VITALS — BP 129/79 | HR 93 | Temp 97.9°F | Ht 67.0 in | Wt 154.4 lb

## 2017-09-12 DIAGNOSIS — K5720 Diverticulitis of large intestine with perforation and abscess without bleeding: Secondary | ICD-10-CM | POA: Diagnosis not present

## 2017-09-12 DIAGNOSIS — K219 Gastro-esophageal reflux disease without esophagitis: Secondary | ICD-10-CM | POA: Diagnosis not present

## 2017-09-12 DIAGNOSIS — K5732 Diverticulitis of large intestine without perforation or abscess without bleeding: Secondary | ICD-10-CM

## 2017-09-12 MED ORDER — PEG 3350-KCL-NA BICARB-NACL 420 G PO SOLR: 4000.0000 mL | Freq: Once | ORAL | 0 refills | Status: AC

## 2017-09-12 NOTE — Assessment & Plan Note (Signed)
Recently admitted with acute diverticulitis with likely perforation and abscess.  CT imaging recommended: Follow-up to exclude underlying neoplasm.  Today he is feeling well.  However, it is only been about 1-2 weeks since his discharge.  We will plan for 8 weeks for complete resolution of acute process before proceeding with colonoscopy.  We will schedule his colonoscopy for early March.  I will have him hold a slot for him today.  We will follow-up with him in the office end of February to ensure he is still doing well.  Proceed with TCS with Dr. Gala Romney in near future: the risks, benefits, and alternatives have been discussed with the patient in detail. The patient states understanding and desires to proceed.  The patient is not on any anticoagulants, anxiolytics, chronic pain medications, or antidepressants.  Conscious sedation should be adequate for his procedure.

## 2017-09-12 NOTE — Assessment & Plan Note (Signed)
GERD symptoms currently well managed on PPI.  Continue medication, return for follow-up as needed.

## 2017-09-12 NOTE — Patient Instructions (Addendum)
1. We will schedule your procedure for you. 2. We will have you follow-up at the end of February to make sure everything is still doing well prior to your colonoscopy. 3. Continue your other current medications, including your acid blocker for heartburn. 4. Call if you have any questions or concerns.   Enjoy your trips to the beach this year!

## 2017-09-12 NOTE — Progress Notes (Signed)
Referring Provider: [name], MD Primary Care Physician:  [name], MD Primary GI:  Dr. Gala Romney  Chief Complaint  Patient presents with  . Diverticulitis    HPI:   [name] is a 72 y.o. male who presents for diverticulitis.  The patient was recently admitted 08/16/2017 through 08/19/2017 for sepsis.  He was noted to have diverticulitis and there is a question if sepsis was due to diverticulitis.  IR reviewed CT and did not think that he had a sigmoid abscess and they were not comfortable putting a drain with the risk of a colon perforation.  Surgery agreed.  His sepsis resolved.  Recommended outpatient colonoscopy when acute infection has resolved.  He was discharged on oral Cipro and Flagyl for a total of 10-day course.  Also provided a probiotic. CT as outlined below.  CT 08/17/2017 during hospitalization:  IMPRESSION: 1. No evidence of active pulmonary disease. 2. Gas and stool-filled thick walled collection in the left lower quadrant measuring 5.9 cm diameter and communicating with sigmoid colon. This is consistent with an abscess, likely indicating diverticulitis with diverticular abscess. A less likely consideration would be a perforated colon cancer. Colonic evaluation is recommended after resolution of the acute process to exclude an underlying colon neoplasm. 3. Aortic atherosclerosis and extensive calcific atherosclerotic changes throughout the abdominal vasculature. Probable calcific stenosis at multiple vessels.  Colonoscopy was last completed 09/22/2013 which found marginal prep which became adequate as liquid stool was lavaged and suctioned out.  Normal rectum, extensive left-sided diverticula, remaining colon normal.  Recommended one more screening colonoscopy in 10 years (2025).  Today he states he's doing well overall. States he had diverticulitis in the hospital. This was his first episode in "many, many years." Denies abdominal pain, N/V, hematochezia. Has  dark stools on iron. Denies fever, chills, unintentional weight loss. Denies GERD symptoms (well managed on Protonix.) Denies chest pain, dyspnea, dizziness, lightheadedness, syncope, near syncope. Denies any other upper or lower GI symptoms.  Past Medical History:  Diagnosis Date  . CAD S/P percutaneous coronary angioplasty 1998   PCI TO CX  . Diabetes mellitus without complication ([place])   . Diverticulitis 08/16/2017   hospitalized with diverticulitis/sepsis  . GERD (gastroesophageal reflux disease)   . Headache   . 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 Hypercholesteremia   . Hypertension   . Neuropathy   . Peripheral vascular disease ([place])    s/p R-L FEM-FEM BYPASS  . Tachycardia    after CABG, pt on medicine for this    Past Surgical History:  Procedure Laterality Date  . ABDOMINAL AORTOGRAM Bilateral 09/19/2016   Procedure: iliac;  Surgeon: Serafina Mitchell, MD;  Location: [place] [facility];  Service: Cardiovascular;  Laterality: Bilateral;  . CARDIAC CATHETERIZATION  2003   with stent  . CARDIAC CATHETERIZATION N/A 10/11/2015   Procedure: Left Heart Cath and Coronary Angiography;  Surgeon: Peter M Martinique, MD;  Location: [place] [facility];  Service: Cardiovascular;  Laterality: N/A;  . COLONOSCOPY N/A 09/22/2013   Procedure: COLONOSCOPY;  Surgeon: Daneil Dolin, MD;  [facility];  Service: Endoscopy;  Laterality: N/A;  9:30 AM  . CORONARY ARTERY BYPASS GRAFT N/A 10/18/2015   Procedure: CORONARY ARTERY BYPASS GRAFTING (CABG) x  five, using left internal mammary artery and right leg greater saphenous vein harvested endoscopically;  Surgeon: [name], MD;  Location: [place];  Service: Open Heart Surgery;  Laterality: N/A;  .  ENDARTERECTOMY FEMORAL Right 08/09/2016   Procedure: ENDARTERECTOMY FEMORAL WITH VEIN PATCH ANGIOPLASTY;  Surgeon: Serafina Mitchell, MD;  Location: [place];  Service: Vascular;  Laterality: Right;  . FEMORAL-FEMORAL BYPASS GRAFT Bilateral 08/09/2016    Procedure: REVISION BYPASS GRAFT RIGHT FEMORAL-LEFT FEMORAL ARTERY;  Surgeon: Serafina Mitchell, MD;  Location: [place];  Service: Vascular;  Laterality: Bilateral;  . FEMORAL-POPLITEAL BYPASS GRAFT    . PERIPHERAL VASCULAR CATHETERIZATION N/A 05/08/2016   Procedure: Lower Extremity Angiography;  Surgeon: Lorretta Harp, MD;  Location: [place] [facility];  Service: Cardiovascular;  Laterality: N/A;  . PERIPHERAL VASCULAR INTERVENTION Right 09/19/2016   Procedure: Peripheral Vascular Intervention;  Surgeon: Serafina Mitchell, MD;  Location: [place] [facility];  Service: Cardiovascular;  Laterality: Right;  ext iliac stent  . TEE WITHOUT CARDIOVERSION N/A 10/18/2015   Procedure: TRANSESOPHAGEAL ECHOCARDIOGRAM (TEE);  Surgeon: [name], MD;  Location: [place];  Service: Open Heart Surgery;  Laterality: N/A;    Current Outpatient Medications  Medication Sig Dispense Refill  . aspirin 81 MG tablet Take 81 mg by mouth daily.    . Cholecalciferol (VITAMIN D) 2000 UNITS tablet Take 2,000 Units by mouth daily.    . cilostazol (PLETAL) 100 MG tablet Take 100 mg by mouth 2 (two) times daily.    . ferrous sulfate 325 (65 FE) MG tablet Take 325 mg by mouth daily.     [name] gabapentin (NEURONTIN) 300 MG capsule Take 300-600 mg by mouth 2 (two) times daily. 300mg  in the morning and 600mg  at bedtime    . insulin glargine (LANTUS) 100 UNIT/ML injection Inject 0.2 mLs (20 Units total) into the skin at bedtime. 10 mL 11  . pantoprazole (PROTONIX) 40 MG tablet Take 1 tablet (40 mg total) by mouth daily. 5 tablet 0  . simvastatin (ZOCOR) 40 MG tablet Take 20 mg by mouth at bedtime.      No current facility-administered medications for this visit.     Allergies as of 09/12/2017 - Review Complete 09/12/2017  Allergen Reaction Noted  . No known allergies  08/08/2016    Family History  Problem Relation Age of Onset  . Heart attack Mother   . Stroke Mother   . Colon cancer Neg Hx     Social History    Socioeconomic History  . Marital status: Married    Spouse name: None  . Number of children: None  . Years of education: None  . Highest education level: None  Social Needs  . Financial resource strain: None  . Food insecurity - worry: None  . Food insecurity - inability: None  . Transportation needs - medical: None  . Transportation needs - non-medical: None  Occupational History  . None  Tobacco Use  . Smoking status: Former Smoker    Packs/day: 2.00    Years: 30.00    Pack years: 60.00    Types: Cigarettes    Last attempt to quit: 08/14/2000    Years since quitting: 17.0  . Smokeless tobacco: Never Used  Substance and Sexual Activity  . Alcohol use: No  . Drug use: No  . Sexual activity: None  Other Topics Concern  . None  Social History Narrative  . None    Review of Systems: General: Negative for anorexia, weight loss, fever, chills, fatigue, weakness. ENT: Negative for hoarseness, difficulty swallowing , nasal congestion. CV: Negative for chest pain, angina, palpitations, dyspnea on exertion, peripheral edema.  Respiratory: Negative for dyspnea at rest,  dyspnea on exertion, cough, sputum, wheezing.  GI: See history of present illness. MS: Negative for joint pain, low back pain.  Derm: Negative for rash or itching.  Endo: Negative for unusual weight change.  Heme: Negative for bruising or bleeding. Allergy: Negative for rash or hives.   Physical Exam: BP 129/79   Pulse 93   Temp 97.9 F (36.6 C) (Oral)   Ht 5\' 7"  (1.702 m)   Wt 154 lb 6.4 oz (70 kg)   BMI 24.18 kg/m  General:   Alert and oriented. Pleasant and cooperative. Well-nourished and well-developed.  Eyes:  Without icterus, sclera clear and conjunctiva pink.  Ears:  Normal auditory acuity. Cardiovascular:  S1, S2 present without murmurs appreciated. Extremities without clubbing or edema. Respiratory:  Clear to auscultation bilaterally. No wheezes, rales, or rhonchi. No distress.   Gastrointestinal:  +BS, soft, non-tender and non-distended. No HSM noted. No guarding or rebound. No masses appreciated.  Rectal:  Deferred  Musculoskalatal:  Symmetrical without gross deformities. Neurologic:  Alert and oriented x4;  grossly normal neurologically. Psych:  Alert and cooperative. Normal mood and affect. Heme/Lymph/Immune: No excessive bruising noted.    09/12/2017 9:03 AM   Disclaimer: This note was dictated with voice recognition software. Similar sounding words can inadvertently be transcribed and may not be corrected upon review.

## 2017-09-13 NOTE — Progress Notes (Signed)
cc'ed to pcp °

## 2017-10-01 ENCOUNTER — Inpatient Hospital Stay (HOSPITAL_COMMUNITY)
Admission: EM | Admit: 2017-10-01 | Discharge: 2017-10-03 | DRG: 871 | Disposition: A | Discharge status: Home / Self Care | Payer: Medicare Other | Attending: Internal Medicine | Admitting: Internal Medicine

## 2017-10-01 ENCOUNTER — Emergency Department (HOSPITAL_COMMUNITY): Payer: Medicare Other

## 2017-10-01 ENCOUNTER — Other Ambulatory Visit: Payer: Self-pay

## 2017-10-01 ENCOUNTER — Encounter (HOSPITAL_COMMUNITY): Payer: Self-pay | Admitting: Emergency Medicine

## 2017-10-01 DIAGNOSIS — A419 Sepsis, unspecified organism: Secondary | ICD-10-CM | POA: Diagnosis not present

## 2017-10-01 DIAGNOSIS — E860 Dehydration: Secondary | ICD-10-CM | POA: Diagnosis not present

## 2017-10-01 DIAGNOSIS — J181 Lobar pneumonia, unspecified organism: Secondary | ICD-10-CM | POA: Diagnosis not present

## 2017-10-01 DIAGNOSIS — Z7982 Long term (current) use of aspirin: Secondary | ICD-10-CM

## 2017-10-01 DIAGNOSIS — E1151 Type 2 diabetes mellitus with diabetic peripheral angiopathy without gangrene: Secondary | ICD-10-CM | POA: Diagnosis not present

## 2017-10-01 DIAGNOSIS — J189 Pneumonia, unspecified organism: Secondary | ICD-10-CM | POA: Diagnosis present

## 2017-10-01 DIAGNOSIS — K219 Gastro-esophageal reflux disease without esophagitis: Secondary | ICD-10-CM | POA: Diagnosis present

## 2017-10-01 DIAGNOSIS — I739 Peripheral vascular disease, unspecified: Secondary | ICD-10-CM | POA: Diagnosis present

## 2017-10-01 DIAGNOSIS — Z9861 Coronary angioplasty status: Secondary | ICD-10-CM | POA: Diagnosis not present

## 2017-10-01 DIAGNOSIS — E871 Hypo-osmolality and hyponatremia: Secondary | ICD-10-CM | POA: Diagnosis present

## 2017-10-01 DIAGNOSIS — N181 Chronic kidney disease, stage 1: Secondary | ICD-10-CM | POA: Diagnosis not present

## 2017-10-01 DIAGNOSIS — Z823 Family history of stroke: Secondary | ICD-10-CM | POA: Diagnosis not present

## 2017-10-01 DIAGNOSIS — R509 Fever, unspecified: Secondary | ICD-10-CM | POA: Diagnosis not present

## 2017-10-01 DIAGNOSIS — N179 Acute kidney failure, unspecified: Secondary | ICD-10-CM | POA: Diagnosis not present

## 2017-10-01 DIAGNOSIS — Y95 Nosocomial condition: Secondary | ICD-10-CM | POA: Diagnosis present

## 2017-10-01 DIAGNOSIS — I10 Essential (primary) hypertension: Secondary | ICD-10-CM | POA: Diagnosis present

## 2017-10-01 DIAGNOSIS — E119 Type 2 diabetes mellitus without complications: Secondary | ICD-10-CM

## 2017-10-01 DIAGNOSIS — Z8249 Family history of ischemic heart disease and other diseases of the circulatory system: Secondary | ICD-10-CM

## 2017-10-01 DIAGNOSIS — E1122 Type 2 diabetes mellitus with diabetic chronic kidney disease: Secondary | ICD-10-CM | POA: Diagnosis not present

## 2017-10-01 DIAGNOSIS — Z951 Presence of aortocoronary bypass graft: Secondary | ICD-10-CM | POA: Diagnosis not present

## 2017-10-01 DIAGNOSIS — Z794 Long term (current) use of insulin: Secondary | ICD-10-CM

## 2017-10-01 DIAGNOSIS — I2510 Atherosclerotic heart disease of native coronary artery without angina pectoris: Secondary | ICD-10-CM | POA: Diagnosis present

## 2017-10-01 DIAGNOSIS — E1142 Type 2 diabetes mellitus with diabetic polyneuropathy: Secondary | ICD-10-CM | POA: Diagnosis present

## 2017-10-01 DIAGNOSIS — E785 Hyperlipidemia, unspecified: Secondary | ICD-10-CM | POA: Diagnosis present

## 2017-10-01 DIAGNOSIS — R000 Tachycardia, unspecified: Secondary | ICD-10-CM | POA: Diagnosis not present

## 2017-10-01 DIAGNOSIS — Z87891 Personal history of nicotine dependence: Secondary | ICD-10-CM

## 2017-10-01 DIAGNOSIS — J811 Chronic pulmonary edema: Secondary | ICD-10-CM | POA: Diagnosis not present

## 2017-10-01 LAB — COMPREHENSIVE METABOLIC PANEL
ALT: 10 U/L — AB (ref 17–63)
AST: 15 U/L (ref 15–41)
Albumin: 2.6 g/dL — ABNORMAL LOW (ref 3.5–5.0)
Alkaline Phosphatase: 68 U/L (ref 38–126)
Anion gap: 10 (ref 5–15)
BUN: 17 mg/dL (ref 6–20)
CALCIUM: 7.8 mg/dL — AB (ref 8.9–10.3)
CHLORIDE: 103 mmol/L (ref 101–111)
CO2: 20 mmol/L — AB (ref 22–32)
CREATININE: 1.44 mg/dL — AB (ref 0.61–1.24)
GFR calc Af Amer: 55 mL/min — ABNORMAL LOW (ref >60)
GFR calc non Af Amer: 48 mL/min — ABNORMAL LOW (ref >60)
Glucose, Bld: 278 mg/dL — ABNORMAL HIGH (ref 65–99)
Potassium: 3.5 mmol/L (ref 3.5–5.1)
SODIUM: 133 mmol/L — AB (ref 135–145)
Total Bilirubin: 0.5 mg/dL (ref 0.3–1.2)
Total Protein: 5.5 g/dL — ABNORMAL LOW (ref 6.5–8.1)

## 2017-10-01 LAB — URINALYSIS, ROUTINE W REFLEX MICROSCOPIC
BACTERIA UA: NONE SEEN
Bilirubin Urine: NEGATIVE
Glucose, UA: >=500 mg/dL — AB
Ketones, ur: NEGATIVE mg/dL
Leukocytes, UA: NEGATIVE
NITRITE: NEGATIVE
Protein, ur: NEGATIVE mg/dL
SPECIFIC GRAVITY, URINE: 1.022 (ref 1.005–1.030)
Squamous Epithelial / LPF: NONE SEEN
pH: 5 (ref 5.0–8.0)

## 2017-10-01 LAB — CBC WITH DIFFERENTIAL/PLATELET
BASOS PCT: 0 %
Basophils Absolute: 0 10*3/uL (ref 0.0–0.1)
EOS ABS: 0 10*3/uL (ref 0.0–0.7)
EOS PCT: 0 %
HCT: 34.9 % — ABNORMAL LOW (ref 39.0–52.0)
Hemoglobin: 11.4 g/dL — ABNORMAL LOW (ref 13.0–17.0)
LYMPHS ABS: 0.4 10*3/uL (ref 0.7–4.0)
Lymphocytes Relative: 4 %
MCH: 28.9 pg (ref 26.0–34.0)
MCHC: 32.7 g/dL (ref 30.0–36.0)
MCV: 88.4 fL (ref 78.0–100.0)
MONO ABS: 0.3 10*3/uL (ref 0.1–1.0)
MONOS PCT: 3 %
Neutro Abs: 8.6 10*3/uL (ref 1.7–7.7)
Neutrophils Relative %: 93 %
PLATELETS: 258 10*3/uL (ref 150–400)
RBC: 3.95 MIL/uL — ABNORMAL LOW (ref 4.22–5.81)
RDW: 14.6 % (ref 11.5–15.5)
WBC: 9.3 10*3/uL (ref 4.0–10.5)

## 2017-10-01 LAB — PROTIME-INR
INR: 1.08
PROTHROMBIN TIME: 13.9 s (ref 11.4–15.2)

## 2017-10-01 LAB — CBG MONITORING, ED: GLUCOSE-CAPILLARY: 194 mg/dL — AB (ref 65–99)

## 2017-10-01 LAB — BRAIN NATRIURETIC PEPTIDE: B Natriuretic Peptide: 92 pg/mL (ref 0.0–100.0)

## 2017-10-01 LAB — LACTIC ACID, PLASMA: LACTIC ACID, VENOUS: 1.9 mmol/L (ref 0.5–1.9)

## 2017-10-01 LAB — MAGNESIUM: Magnesium: 1.6 mg/dL — ABNORMAL LOW (ref 1.7–2.4)

## 2017-10-01 LAB — PHOSPHORUS: Phosphorus: 3.2 mg/dL (ref 2.5–4.6)

## 2017-10-01 MED ORDER — LEVOFLOXACIN IN D5W 750 MG/150ML IV SOLN
750.0000 mg | INTRAVENOUS | Status: DC
  Administered 2017-10-01 – 2017-10-02 (×2): 750 mg via INTRAVENOUS
  Filled 2017-10-01 (×2): qty 150

## 2017-10-01 MED ORDER — ONDANSETRON HCL 4 MG PO TABS: 4.0000 mg | ORAL_TABLET | Freq: Four times a day (QID) | ORAL | Status: DC | PRN

## 2017-10-01 MED ORDER — MAGNESIUM SULFATE 2 GM/50ML IV SOLN
2.0000 g | Freq: Once | INTRAVENOUS | Status: AC
  Administered 2017-10-01: 2 g via INTRAVENOUS
  Filled 2017-10-01: qty 50

## 2017-10-01 MED ORDER — SODIUM CHLORIDE 0.9 % IV SOLN
1.0000 g | Freq: Once | INTRAVENOUS | Status: AC
  Administered 2017-10-01: 1 g via INTRAVENOUS
  Filled 2017-10-01: qty 10

## 2017-10-01 MED ORDER — ACETAMINOPHEN 325 MG PO TABS
650.0000 mg | ORAL_TABLET | Freq: Four times a day (QID) | ORAL | Status: DC | PRN
  Administered 2017-10-02: 650 mg via ORAL
  Filled 2017-10-01: qty 2

## 2017-10-01 MED ORDER — SODIUM CHLORIDE 0.9 % IV BOLUS (SEPSIS)
1000.0000 mL | Freq: Once | INTRAVENOUS | Status: AC
  Administered 2017-10-01: 1000 mL via INTRAVENOUS

## 2017-10-01 MED ORDER — SODIUM CHLORIDE 0.9 % IV SOLN
1.0000 g | Freq: Three times a day (TID) | INTRAVENOUS | Status: DC
  Administered 2017-10-02 – 2017-10-03 (×5): 1 g via INTRAVENOUS
  Filled 2017-10-01 (×10): qty 1

## 2017-10-01 MED ORDER — INSULIN ASPART 100 UNIT/ML ~~LOC~~ SOLN
0.0000 [IU] | Freq: Three times a day (TID) | SUBCUTANEOUS | Status: DC
  Administered 2017-10-02: 3 [IU] via SUBCUTANEOUS
  Administered 2017-10-03: 5 [IU] via SUBCUTANEOUS

## 2017-10-01 MED ORDER — VANCOMYCIN HCL IN DEXTROSE 750-5 MG/150ML-% IV SOLN
750.0000 mg | Freq: Two times a day (BID) | INTRAVENOUS | Status: DC
  Administered 2017-10-02: 750 mg via INTRAVENOUS
  Filled 2017-10-01 (×4): qty 150

## 2017-10-01 MED ORDER — ACETAMINOPHEN 650 MG RE SUPP
650.0000 mg | Freq: Four times a day (QID) | RECTAL | Status: DC | PRN
Start: 2017-10-01 — End: 2017-10-03

## 2017-10-01 MED ORDER — ALBUTEROL SULFATE (2.5 MG/3ML) 0.083% IN NEBU
2.5000 mg | INHALATION_SOLUTION | Freq: Four times a day (QID) | RESPIRATORY_TRACT | Status: DC
  Administered 2017-10-01: 2.5 mg via RESPIRATORY_TRACT
  Filled 2017-10-01: qty 3

## 2017-10-01 MED ORDER — ENOXAPARIN SODIUM 40 MG/0.4ML ~~LOC~~ SOLN
40.0000 mg | SUBCUTANEOUS | Status: DC
  Administered 2017-10-01 – 2017-10-02 (×2): 40 mg via SUBCUTANEOUS
  Filled 2017-10-01 (×2): qty 0.4

## 2017-10-01 MED ORDER — SODIUM CHLORIDE 0.9 % IV SOLN
500.0000 mg | INTRAVENOUS | Status: DC
  Administered 2017-10-01: 500 mg via INTRAVENOUS
  Filled 2017-10-01: qty 500

## 2017-10-01 MED ORDER — ONDANSETRON HCL 4 MG/2ML IJ SOLN: 4.0000 mg | Freq: Four times a day (QID) | INTRAMUSCULAR | Status: DC | PRN

## 2017-10-01 MED ORDER — SODIUM CHLORIDE 0.9 % IV SOLN
1500.0000 mg | Freq: Once | INTRAVENOUS | Status: AC
  Administered 2017-10-01: 1500 mg via INTRAVENOUS
  Filled 2017-10-01 (×2): qty 1500

## 2017-10-01 MED ORDER — IBUPROFEN 400 MG PO TABS
ORAL_TABLET | ORAL | Status: AC
  Administered 2017-10-01: 600 mg
  Filled 2017-10-01: qty 2

## 2017-10-01 MED ORDER — SODIUM CHLORIDE 0.9 % IV BOLUS (SEPSIS)
250.0000 mL | Freq: Once | INTRAVENOUS | Status: AC
  Administered 2017-10-01: 250 mL via INTRAVENOUS

## 2017-10-01 MED ORDER — IPRATROPIUM BROMIDE 0.02 % IN SOLN
0.5000 mg | Freq: Four times a day (QID) | RESPIRATORY_TRACT | Status: DC
  Administered 2017-10-01: 0.5 mg via RESPIRATORY_TRACT
  Filled 2017-10-01: qty 2.5

## 2017-10-01 MED ORDER — ALBUTEROL SULFATE (2.5 MG/3ML) 0.083% IN NEBU: 2.5000 mg | INHALATION_SOLUTION | RESPIRATORY_TRACT | Status: DC | PRN

## 2017-10-01 MED ORDER — IPRATROPIUM-ALBUTEROL 0.5-2.5 (3) MG/3ML IN SOLN
3.0000 mL | Freq: Four times a day (QID) | RESPIRATORY_TRACT | Status: DC
  Administered 2017-10-02 (×3): 3 mL via RESPIRATORY_TRACT
  Filled 2017-10-01 (×3): qty 3

## 2017-10-01 NOTE — ED Provider Notes (Signed)
[facility] EMERGENCY DEPARTMENT Provider Note   CSN: [id number] Arrival date & time: 10/01/17  1758     History   Chief Complaint Chief Complaint  Patient presents with  . Fever    HPI [name] is a 72 y.o. male.  Level 5 caveat for urgent need for intervention.  Patient reports fever, cough since this morning.  His temperature at home was 101.5.  He was given Tylenol.  His temperature now is 103.  No cigarette smoking.  Code sepsis was initiated by EMS.      Past Medical History:  Diagnosis Date  . CAD S/P percutaneous coronary angioplasty 1998   PCI TO CX  . Diabetes mellitus without complication ([place])   . Diverticulitis 08/16/2017   hospitalized with diverticulitis/sepsis  . GERD (gastroesophageal reflux disease)   . Headache   . 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 Hypercholesteremia   . Hypertension   . Neuropathy   . Peripheral vascular disease ([place])    s/p R-L FEM-FEM BYPASS  . Tachycardia    after CABG, pt on medicine for this    Patient Active Problem List   Diagnosis Date Noted  . Diverticulitis large intestine 08/19/2017  . Diverticulitis of large intestine with perforation and abscess without bleeding   . CKD (chronic kidney disease) stage 3, GFR 30-59 ml/min (HCC) 08/02/2017  . Sepsis due to undetermined organism ([place]) 08/01/2017  . GERD (gastroesophageal reflux disease) 08/01/2017  . Bacteremia 06/05/2017  . SIRS (systemic inflammatory response syndrome) ([place]) 05/31/2017  . PAD (peripheral artery disease) ([place]) 08/09/2016  . Claudication ([place]) 05/08/2016  . HCAP (healthcare-associated pneumonia) 01/12/2016  . Hyponatremia 01/12/2016  . Abnormal chest CT 10/13/2015  . Type 2 diabetes, HbA1c goal < 7% (HCC)   . Unstable angina ([place])   . Hypomagnesemia   . Healthcare-associated pneumonia 10/09/2015  . Type 2 diabetes mellitus ([place]) 10/09/2015  . ASCVD (arteriosclerotic cardiovascular disease) 10/09/2015  . CKD (chronic kidney disease) stage  2, GFR 60-89 ml/min 10/09/2015  . CAP (community acquired pneumonia) 07/09/2015  . Sepsis ([place]) 07/09/2015  . 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 PVD (peripheral vascular disease) ([place]) 07/09/2015  . Essential hypertension 07/09/2015  . Hypotension 07/09/2015  . Neuropathy, peripheral 07/09/2015  . HLD (hyperlipidemia) 07/09/2015  . AKI (acute kidney injury) ([place]) 07/09/2015    Past Surgical History:  Procedure Laterality Date  . ABDOMINAL AORTOGRAM Bilateral 09/19/2016   Procedure: iliac;  Surgeon: Serafina Mitchell, MD;  Location: [place] [facility];  Service: Cardiovascular;  Laterality: Bilateral;  . CARDIAC CATHETERIZATION  2003   with stent  . CARDIAC CATHETERIZATION N/A 10/11/2015   Procedure: Left Heart Cath and Coronary Angiography;  Surgeon: Peter M Martinique, MD;  Location: [place] [facility];  Service: Cardiovascular;  Laterality: N/A;  . COLONOSCOPY N/A 09/22/2013   Procedure: COLONOSCOPY;  Surgeon: Daneil Dolin, MD;  [facility];  Service: Endoscopy;  Laterality: N/A;  9:30 AM  . CORONARY ARTERY BYPASS GRAFT N/A 10/18/2015   Procedure: CORONARY ARTERY BYPASS GRAFTING (CABG) x  five, using left internal mammary artery and right leg greater saphenous vein harvested endoscopically;  Surgeon: [name], MD;  Location: [place];  Service: Open Heart Surgery;  Laterality: N/A;  . ENDARTERECTOMY FEMORAL Right 08/09/2016   Procedure: ENDARTERECTOMY FEMORAL WITH VEIN PATCH ANGIOPLASTY;  Surgeon: Serafina Mitchell, MD;  Location: [place];  Service: Vascular;  Laterality: Right;  .  FEMORAL-FEMORAL BYPASS GRAFT Bilateral 08/09/2016   Procedure: REVISION BYPASS GRAFT RIGHT FEMORAL-LEFT FEMORAL ARTERY;  Surgeon: Serafina Mitchell, MD;  Location: [place];  Service: Vascular;  Laterality: Bilateral;  . FEMORAL-POPLITEAL BYPASS GRAFT    . PERIPHERAL VASCULAR CATHETERIZATION N/A 05/08/2016   Procedure: Lower Extremity Angiography;  Surgeon: Lorretta Harp, MD;  Location: [place] [facility];  Service: Cardiovascular;  Laterality: N/A;  . PERIPHERAL VASCULAR INTERVENTION Right 09/19/2016   Procedure: Peripheral Vascular Intervention;  Surgeon: Serafina Mitchell, MD;  Location: [place] [facility];  Service: Cardiovascular;  Laterality: Right;  ext iliac stent  . TEE WITHOUT CARDIOVERSION N/A 10/18/2015   Procedure: TRANSESOPHAGEAL ECHOCARDIOGRAM (TEE);  Surgeon: [name], MD;  Location: [employer];  Service: Open Heart Surgery;  Laterality: N/A;       Home Medications    Prior to Admission medications   Medication Sig Start Date End Date Taking? Authorizing Provider  aspirin 81 MG tablet Take 81 mg by mouth daily.    [provider]  Cholecalciferol (VITAMIN D) 2000 UNITS tablet Take 2,000 Units by mouth daily.    [provider]  cilostazol (PLETAL) 100 MG tablet Take 100 mg by mouth 2 (two) times daily.    [provider]  ferrous sulfate 325 (65 FE) MG tablet Take 325 mg by mouth daily.     [provider]  gabapentin (NEURONTIN) 300 MG capsule Take 300-600 mg by mouth 2 (two) times daily. 300mg  in the morning and 600mg  at bedtime    [provider]  insulin glargine (LANTUS) 100 UNIT/ML injection Inject 0.2 mLs (20 Units total) into the skin at bedtime. 08/03/17   Isaac Bliss, Rayford Halsted, MD  pantoprazole (PROTONIX) 40 MG tablet Take 1 tablet (40 mg total) by mouth daily. 08/19/17   Dhungel, Flonnie Overman, MD  simvastatin (ZOCOR) 40 MG tablet Take 20 mg by mouth at bedtime.     [provider]    Family History Family History  Problem Relation Age of Onset  . Heart attack Mother   . Stroke Mother   . Colon cancer Neg Hx     Social History Social History   Tobacco Use  . Smoking status: Former Smoker    Packs/day: 2.00    Years: 30.00    Pack years: 60.00    Types: Cigarettes    Last attempt to quit: 08/14/2000    Years since quitting: 17.1  . Smokeless tobacco: Never Used  Substance Use Topics    . Alcohol use: No  . Drug use: No     Allergies   No known allergies   Review of Systems Review of Systems  Unable to perform ROS: Acuity of condition     Physical Exam Updated Vital Signs BP (!) 127/97   Pulse 99   Temp 98.8 F (37.1 C) (Oral)   Resp 18   Ht 5\' 7"  (1.702 m)   Wt 83.9 kg (185 lb)   SpO2 96%   BMI 28.98 kg/m   Physical Exam  Constitutional: He is oriented to person, place, and time.  Alert, no frank resp distress; stable appearing  HENT:  Head: Normocephalic and atraumatic.  Eyes: Conjunctivae are normal.  Neck: Neck supple.  Cardiovascular: Normal rate and regular rhythm.  Pulmonary/Chest: Effort normal and breath sounds normal.  Abdominal: Soft. Bowel sounds are normal.  Musculoskeletal: Normal range of motion.  Neurological: He is alert and oriented to person, place, and time.  Skin: Skin  is warm and dry.  Psychiatric: He has a normal mood and affect. His behavior is normal.  Nursing note and vitals reviewed.    ED Treatments / Results  Labs (all labs ordered are listed, but only abnormal results are displayed) Labs Reviewed  COMPREHENSIVE METABOLIC PANEL - Abnormal; Notable for the following components:      Result Value   Sodium 133 (*)    CO2 20 (*)    Glucose, Bld 278 (*)    Creatinine, Ser 1.44 (*)    Calcium 7.8 (*)    Total Protein 5.5 (*)    Albumin 2.6 (*)    ALT 10 (*)    GFR calc non Af Amer 48 (*)    GFR calc Af Amer 55 (*)    All other components within normal limits  CBC WITH DIFFERENTIAL/PLATELET - Abnormal; Notable for the following components:   RBC 3.95 (*)    Hemoglobin 11.4 (*)    HCT 34.9 (*)    All other components within normal limits  CULTURE, BLOOD (ROUTINE X 2)  CULTURE, BLOOD (ROUTINE X 2)  PROTIME-INR  LACTIC ACID, PLASMA  URINALYSIS, ROUTINE W REFLEX MICROSCOPIC    EKG  EKG Interpretation  Date/Time:  Monday October 01 2017 18:06:56 EST Ventricular Rate:  110 PR Interval:    QRS  Duration: 121 QT Interval:  351 QTC Calculation: 475 R Axis:   45 Text Interpretation:  Sinus tachycardia Nonspecific intraventricular conduction delay Confirmed by Nat Christen [phone]) on 10/01/2017 6:20:12 PM       Radiology Dg Chest Port 1 View  Result Date: 10/01/2017 CLINICAL DATA:  Fever EXAM: PORTABLE CHEST 1 VIEW COMPARISON:  08/16/2017 chest radiograph. FINDINGS: Intact sternotomy wires. Stable cardiomediastinal silhouette with mild cardiomegaly. No pneumothorax. No pleural effusion. Mild diffuse hazy and linear parahilar lung opacities. IMPRESSION: Mild diffuse hazy and linear parahilar lung opacities, favored to represent mild pulmonary edema due to congestive heart failure given the mild cardiomegaly, although the differential includes atypical pneumonia such as mycoplasma given history of fever. Electronically Signed   By: Ilona Sorrel M.D.   On: 10/01/2017 18:45    Procedures Procedures (including critical care time)  Medications Ordered in ED Medications  azithromycin (ZITHROMAX) 500 mg in sodium chloride 0.9 % 250 mL IVPB (500 mg Intravenous New Bag/Given 10/01/17 1921)  sodium chloride 0.9 % bolus 1,000 mL (0 mLs Intravenous Stopped 10/01/17 1914)  sodium chloride 0.9 % bolus 1,000 mL (0 mLs Intravenous Stopped 10/01/17 1911)  cefTRIAXone (ROCEPHIN) 1 g in sodium chloride 0.9 % 100 mL IVPB (1 g Intravenous New Bag/Given 10/01/17 1910)  ibuprofen (ADVIL,MOTRIN) 400 MG tablet (600 mg  Given 10/01/17 1849)     Initial Impression / Assessment and Plan / ED Course  I have reviewed the triage vital signs and the nursing notes.  Pertinent labs & imaging results that were available during my care of the patient were reviewed by me and considered in my medical decision making (see chart for details).     Patient presents with fever and cough.  Code sepsis initiated by EMS.  Chest x-ray suggests bilateral infiltrates.  Lactate high normal.  Will hydrate, start IV antibiotics.   Admit to general medicine.   CRITICAL CARE Performed by: Nat Christen Total critical care time: 30 minutes Critical care time was exclusive of separately billable procedures and treating other patients. Critical care was necessary to treat or prevent imminent or life-threatening deterioration. Critical care was time spent personally by me on  the following activities: development of treatment plan with patient and/or surrogate as well as nursing, discussions with consultants, evaluation of patient's response to treatment, examination of patient, obtaining history from patient or surrogate, ordering and performing treatments and interventions, ordering and review of laboratory studies, ordering and review of radiographic studies, pulse oximetry and re-evaluation of patient's condition.  Final Clinical Impressions(s) / ED Diagnoses   Final diagnoses:  Pneumonia of both lungs due to infectious organism, unspecified part of lung    ED Discharge Orders    None       Nat Christen, MD 10/01/17 1950

## 2017-10-01 NOTE — H&P (Signed)
History and Physical    ADDAM GOELLER HEN:[id number] DOB: Dec 09, 1945 DOA: 10/01/2017  PCP: [name], MD   Patient coming from: Home.  I have personally briefly reviewed patient's old medical records in [place]  Chief Complaint: Fever and cough since the morning.  HPI: ADELARD SANON is a 72 y.o. male with medical history significant of CAD, history of CABG x5, S/P PTCA, peripheral vascular disease status post bilateral femoral to femoral bypass, history of tachycardia, GERD, history of headache, hyperlipidemia, hypertension, type 2 diabetes, diabetic peripheral neuropathy who is coming to the emergency department with complaints of fever and cough since the morning.  Per patient, he was feeling a little tired yesterday, but went to bed uneventfully.  When he woke up this morning he had a fever, a frequent cough which was occasionally productive of whitish sputum.  He denies headache, rhinorrhea, sore throat, hemoptysis, chest pain, palpitations, dizziness, diaphoresis or pitting edema of the lower extremities.  Denies changes in appetite, abdominal pain, nausea, emesis, diarrhea or constipation.  No melena or hematochezia.  No dysuria, frequency or hematuria. Denies travel history of sick contacts.  ED Course: Initial vital signs in the emergency department temperature 39.4C (103 F), pulse 112, respirations 12, blood pressure 104/61 mmHg and O2 sat 96%.  Lab work shows a CBC with a white count of 9.3 with 93% neutrophils, hemoglobin 11.4 g/dL and platelets 258.  PT and INR were normal.  Sodium was 133, potassium 3.5, chloride 103 and CO2 20 mmol/L.  Glucose 278, BUN 17, creatinine 1.44 and calcium 7.8 mg/dL.  Lactic acid was normal at 1.9 blood cultures x2 were drawn.  Imaging shows mild diffuse hazy and linear perihilar lung opacities.  Per radiology this is favored to represent mild pulmonary edema due to mild cardiomegaly, although they include atypical pneumonia in the  differential.  Please see image and full radiology report for further detail.  Treatment in the ED: The patient received supplemental oxygen, a 2000 mL normal saline bolus, azithromycin 500 mg IVPB and ceftriaxone 1 g IVPB.    Review of Systems: As per HPI otherwise 10 point review of systems negative.    Past Medical History:  Diagnosis Date  . CAD S/P percutaneous coronary angioplasty 1998   PCI TO CX  . Diabetes mellitus without complication ([place])   . Diverticulitis 08/16/2017   hospitalized with diverticulitis/sepsis  . GERD (gastroesophageal reflux disease)   . Headache   . 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 Hypercholesteremia   . Hypertension   . Neuropathy   . Peripheral vascular disease ([place])    s/p R-L FEM-FEM BYPASS  . Tachycardia    after CABG, pt on medicine for this    Past Surgical History:  Procedure Laterality Date  . ABDOMINAL AORTOGRAM Bilateral 09/19/2016   Procedure: iliac;  Surgeon: Serafina Mitchell, MD;  Location: [place] [facility];  Service: Cardiovascular;  Laterality: Bilateral;  . CARDIAC CATHETERIZATION  2003   with stent  . CARDIAC CATHETERIZATION N/A 10/11/2015   Procedure: Left Heart Cath and Coronary Angiography;  Surgeon: Peter M Martinique, MD;  Location: [place] [facility];  Service: Cardiovascular;  Laterality: N/A;  . COLONOSCOPY N/A 09/22/2013   Procedure: COLONOSCOPY;  Surgeon: Daneil Dolin, MD;  [facility];  Service: Endoscopy;  Laterality: N/A;  9:30 AM  . CORONARY ARTERY BYPASS GRAFT N/A 10/18/2015   Procedure: CORONARY ARTERY BYPASS GRAFTING (CABG) x  five, using  left internal mammary artery and right leg greater saphenous vein harvested endoscopically;  Surgeon: [name], MD;  Location: [place];  Service: Open Heart Surgery;  Laterality: N/A;  . ENDARTERECTOMY FEMORAL Right 08/09/2016   Procedure: ENDARTERECTOMY FEMORAL WITH VEIN PATCH ANGIOPLASTY;  Surgeon: Serafina Mitchell, MD;  Location: [place];  Service: Vascular;   Laterality: Right;  . FEMORAL-FEMORAL BYPASS GRAFT Bilateral 08/09/2016   Procedure: REVISION BYPASS GRAFT RIGHT FEMORAL-LEFT FEMORAL ARTERY;  Surgeon: Serafina Mitchell, MD;  Location: [place];  Service: Vascular;  Laterality: Bilateral;  . FEMORAL-POPLITEAL BYPASS GRAFT    . PERIPHERAL VASCULAR CATHETERIZATION N/A 05/08/2016   Procedure: Lower Extremity Angiography;  Surgeon: Lorretta Harp, MD;  Location: [place] [facility];  Service: Cardiovascular;  Laterality: N/A;  . PERIPHERAL VASCULAR INTERVENTION Right 09/19/2016   Procedure: Peripheral Vascular Intervention;  Surgeon: Serafina Mitchell, MD;  Location: [place] [facility];  Service: Cardiovascular;  Laterality: Right;  ext iliac stent  . TEE WITHOUT CARDIOVERSION N/A 10/18/2015   Procedure: TRANSESOPHAGEAL ECHOCARDIOGRAM (TEE);  Surgeon: [name], MD;  Location: [place];  Service: Open Heart Surgery;  Laterality: N/A;     reports that he quit smoking about 17 years ago. His smoking use included cigarettes. He has a 60.00 pack-year smoking history. he has never used smokeless tobacco. He reports that he does not drink alcohol or use drugs.  Allergies  Allergen Reactions  . No Known Allergies     Family History  Problem Relation Age of Onset  . Heart attack Mother   . Stroke Mother   . Colon cancer Neg Hx     Prior to Admission medications   Medication Sig Start Date End Date Taking? Authorizing Provider  aspirin 81 MG tablet Take 81 mg by mouth daily.    [provider]  Cholecalciferol (VITAMIN D) 2000 UNITS tablet Take 2,000 Units by mouth daily.    [provider]  cilostazol (PLETAL) 100 MG tablet Take 100 mg by mouth 2 (two) times daily.    [provider]  ferrous sulfate 325 (65 FE) MG tablet Take 325 mg by mouth daily.     [provider]  gabapentin (NEURONTIN) 300 MG capsule Take 300-600 mg by mouth 2 (two) times daily. 300mg  in the morning and 600mg  at bedtime    [provider]  insulin glargine (LANTUS) 100 UNIT/ML injection Inject 0.2 mLs (20 Units total) into the skin at bedtime. 08/03/17   Isaac Bliss, Rayford Halsted, MD  pantoprazole (PROTONIX) 40 MG tablet Take 1 tablet (40 mg total) by mouth daily. 08/19/17   Dhungel, Flonnie Overman, MD  simvastatin (ZOCOR) 40 MG tablet Take 20 mg by mouth at bedtime.     [provider]    Physical Exam: Vitals:   10/01/17 1806 10/01/17 1918 10/01/17 1930 10/01/17 1933  BP:  (!) 127/97 (!) 113/57   Pulse:  99 (!) 101   Resp:  18    Temp: (!) 103 F (39.4 C)   98.8 F (37.1 C)  TempSrc: Rectal   Oral  SpO2:  96% 94%   Weight:      Height:        Constitutional: Looks acutely ill, but currently in NAD  Eyes: PERRL, lids and conjunctivae normal ENMT: Mucous membranes are moist. Posterior pharynx clear of any exudate or lesions. Neck: normal, supple, no masses, no thyromegaly Respiratory: Mild rhonchi bilaterally, no wheezing, no crackles. Normal respiratory effort. No accessory muscle  use.  Cardiovascular: Tachycardic at 102 bpm, no murmurs / rubs / gallops. No extremity edema. 2+ pedal pulses. No carotid bruits.  Abdomen: Soft, no tenderness, no masses palpated. No hepatosplenomegaly. Bowel sounds positive.  Musculoskeletal: no clubbing / cyanosis. No joint deformity upper and lower extremities. Good ROM, no contractures. Normal muscle tone.  Skin: no clinically significant rashes, lesions, ulcers on limited dermatological exam. Neurologic: CN 2-12 grossly intact. Sensation intact, DTR normal. Strength 5/5 in all 4.  Psychiatric: Normal judgment and insight. Alert and oriented x 3. Normal mood.    Labs on Admission: I have personally reviewed following labs and imaging studies  CBC: Recent Labs  Lab 10/01/17 1805  WBC 9.3  NEUTROABS 8.6  HGB 11.4*  HCT 34.9*  MCV 88.4  PLT 387   Basic Metabolic Panel: Recent Labs  Lab 10/01/17 1805  NA 133*  K 3.5  CL 103  CO2 20*  GLUCOSE 278*    BUN 17  CREATININE 1.44*  CALCIUM 7.8*   GFR: Estimated Creatinine Clearance: 49.4 mL/min (A) (by C-G formula based on SCr of 1.44 mg/dL (H)). Liver Function Tests: Recent Labs  Lab 10/01/17 1805  AST 15  ALT 10*  ALKPHOS 68  BILITOT 0.5  PROT 5.5*  ALBUMIN 2.6*   No results for input(s): LIPASE, AMYLASE in the last 168 hours. No results for input(s): AMMONIA in the last 168 hours. Coagulation Profile: Recent Labs  Lab 10/01/17 1805  INR 1.08   Cardiac Enzymes: No results for input(s): CKTOTAL, CKMB, CKMBINDEX, TROPONINI in the last 168 hours. BNP (last 3 results) No results for input(s): PROBNP in the last 8760 hours. HbA1C: No results for input(s): HGBA1C in the last 72 hours. CBG: No results for input(s): GLUCAP in the last 168 hours. Lipid Profile: No results for input(s): CHOL, HDL, LDLCALC, TRIG, CHOLHDL, LDLDIRECT in the last 72 hours. Thyroid Function Tests: No results for input(s): TSH, T4TOTAL, FREET4, T3FREE, THYROIDAB in the last 72 hours. Anemia Panel: No results for input(s): VITAMINB12, FOLATE, FERRITIN, TIBC, IRON, RETICCTPCT in the last 72 hours. Urine analysis:    Component Value Date/Time   COLORURINE YELLOW 08/16/2017 2358   APPEARANCEUR CLEAR 08/16/2017 2358   LABSPEC 1.026 08/16/2017 2358   PHURINE 5.0 08/16/2017 2358   GLUCOSEU >=500 (A) 08/16/2017 2358   HGBUR NEGATIVE 08/16/2017 2358   BILIRUBINUR NEGATIVE 08/16/2017 2358   KETONESUR NEGATIVE 08/16/2017 2358   PROTEINUR NEGATIVE 08/16/2017 2358   UROBILINOGEN 0.2 03/16/2015 0120   NITRITE NEGATIVE 08/16/2017 2358   LEUKOCYTESUR NEGATIVE 08/16/2017 2358    Radiological Exams on Admission: Dg Chest Port 1 View  Result Date: 10/01/2017 CLINICAL DATA:  Fever EXAM: PORTABLE CHEST 1 VIEW COMPARISON:  08/16/2017 chest radiograph. FINDINGS: Intact sternotomy wires. Stable cardiomediastinal silhouette with mild cardiomegaly. No pneumothorax. No pleural effusion. Mild diffuse hazy and linear  parahilar lung opacities. IMPRESSION: Mild diffuse hazy and linear parahilar lung opacities, favored to represent mild pulmonary edema due to congestive heart failure given the mild cardiomegaly, although the differential includes atypical pneumonia such as mycoplasma given history of fever. Electronically Signed   By: Ilona Sorrel M.D.   On: 10/01/2017 18:45   10/18/2015 echo TEE ------------------------------------------------------------------- LV EF: 50% -   55%  ------------------------------------------------------------------- Indications:      CAD of native vessels 414.01.  ------------------------------------------------------------------- History:   Risk factors:  Hypertension. Diabetes mellitus. Dyslipidemia.  ------------------------------------------------------------------- Study Conclusions  - Left ventricle: The cavity size was normal. Wall thickness was   normal. Systolic  function was normal. The estimated ejection   fraction was in the range of 50% to 55%. There was no dynamic   obstruction. Hypokinesis of the anterior and anterolateral   myocardium; consistent with ischemia. - Aortic valve: No evidence of vegetation. - Mitral valve: No evidence of vegetation. - Atrial septum: No defect or patent foramen ovale was identified.  Impressions:  - Ejection Fraction of 50%. Mild hypokinesis of the anterolateral   and anterior walls. Trivial mitral and tricuspid regurgitation.   EKG: Independently reviewed. Vent. rate 110 BPM PR interval * ms QRS duration 121 ms QT/QTc 351/475 ms P-R-T axes 57 45 47 Sinus tachycardia Nonspecific intraventricular conduction delay Not significantly changed from last tracing on 08/16/2017  Assessment/Plan Principal Problem:   Sepsis due to pneumonia [facility])   Healthcare associated versus atypical pneumonia Admit to telemetry/inpatient. Continue supplemental oxygen. Continue bronchodilators as needed. Cefepime 1 g IVPB  every 8 hours. Vancomycin per pharmacy. Levaquin 750 mg IVPB every 24 hours. Follow-up blood cultures and sensitivity. Check a sputum culture and sensitivity. Check strep pneumoniae and Legionella urinary antigens.  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    Essential hypertension Hold metoprolol at this time due to transient hypotension. Monitor blood pressure.    HLD (hyperlipidemia) Continue simvastatin 40 mg p.o. daily. Monitor hepatic function panel as needed.. Fasting lipid profile follow-up as an outpatient.    Type 2 diabetes mellitus (HCC) Last hemoglobin A1c was 9.8% on May 31, 2017 Carbohydrate modified diet. Continue Lantus 10 units SQ at bedtime. CBG monitoring with regular insulin sliding scale while in the hospital.    Hypomagnesemia Replaced.    Hyponatremia Received normal saline in the ED. Follow-up sodium level in a.m.    PAD (peripheral artery disease) (HCC) Continue atorvastatin, aspirin and Pletal.    GERD (gastroesophageal reflux disease) Protonix 40 mg p.o. daily.     DVT prophylaxis: Lovenox SQ. Code Status: Full code. Family Communication:  Disposition Plan: Admit for IV antibiotic therapy for several days. Consults called:  Admission status: Inpatient/telemetry.   Reubin Milan MD [facility] Pager [phone].  If 7PM-7AM, please contact night-coverage [email or web address] Password [facility]  10/01/2017, 8:06 PM

## 2017-10-01 NOTE — ED Notes (Signed)
EDP notified of pt signs/symptoms.

## 2017-10-01 NOTE — Progress Notes (Signed)
Pharmacy Antibiotic Note  [name] is a 72 y.o. male admitted on 10/01/2017 with pneumonia.  Pharmacy has been consulted for Vancomycin dosing.  Plan: Vancomycin 1500mg  loading dose, then 750mg   IV every 12 hours.  Goal trough 15-20 mcg/mL.  Also on Levaquin 750mg  IV q24h F/U cxs and clinical progress Monitor V/S, labs and levels as indicated  Height: 5\' 7"  (170.2 cm) Weight: 185 lb (83.9 kg) IBW/kg (Calculated) : 66.1  Temp (24hrs), Avg:100.9 F (38.3 C), Min:98.8 F (37.1 C), Max:103 F (39.4 C)  Recent Labs  Lab 10/01/17 1805 10/01/17 1806  WBC 9.3  --   CREATININE 1.44*  --   LATICACIDVEN  --  1.9    Estimated Creatinine Clearance: 49.4 mL/min (A) (by C-G formula based on SCr of 1.44 mg/dL (H)).    Allergies  Allergen Reactions  . No Known Allergies     Antimicrobials this admission: Vancomycin 2/18 >>  levaquin 2/18 >>  Ceftriaxone and azithromycin  x 1 dose 2/18  Dose adjustments this admission: n/a  Microbiology results: 2/18 BCx: pending  Thank you for allowing pharmacy to be a part of this patient's care.  Isac Sarna, BS [place], [place] Clinical Pharmacist Pager [phone] 10/01/2017 10:15 PM

## 2017-10-01 NOTE — ED Triage Notes (Addendum)
Code sepsis called in field by EMS. IV established, NS fluids infused approximately 200cc at time of arrival. Pt reports fever, cough since this am. Pt reports has history of diverticulitis. Pt has had 1500 mg of tylenol since 1630. cbg en route 288.

## 2017-10-01 NOTE — ED Notes (Signed)
Pt BP down, pt is sleeping. 288ml NS bolus started MD paged.

## 2017-10-02 ENCOUNTER — Other Ambulatory Visit: Payer: Self-pay

## 2017-10-02 ENCOUNTER — Encounter (HOSPITAL_COMMUNITY): Payer: Self-pay

## 2017-10-02 DIAGNOSIS — J189 Pneumonia, unspecified organism: Secondary | ICD-10-CM | POA: Diagnosis present

## 2017-10-02 DIAGNOSIS — J181 Lobar pneumonia, unspecified organism: Secondary | ICD-10-CM | POA: Diagnosis present

## 2017-10-02 LAB — COMPREHENSIVE METABOLIC PANEL
ALK PHOS: 60 U/L (ref 38–126)
ALT: 10 U/L — ABNORMAL LOW (ref 17–63)
ANION GAP: 7 (ref 5–15)
AST: 16 U/L (ref 15–41)
Albumin: 2.4 g/dL — ABNORMAL LOW (ref 3.5–5.0)
BUN: 15 mg/dL (ref 6–20)
CO2: 21 mmol/L — AB (ref 22–32)
Calcium: 7.8 mg/dL — ABNORMAL LOW (ref 8.9–10.3)
Chloride: 109 mmol/L (ref 101–111)
Creatinine, Ser: 1.14 mg/dL (ref 0.61–1.24)
GFR calc Af Amer: >60 mL/min (ref >60)
GFR calc non Af Amer: >60 mL/min (ref >60)
GLUCOSE: 109 mg/dL — AB (ref 65–99)
POTASSIUM: 3.7 mmol/L (ref 3.5–5.1)
SODIUM: 137 mmol/L (ref 135–145)
Total Bilirubin: 0.4 mg/dL (ref 0.3–1.2)
Total Protein: 5.2 g/dL — ABNORMAL LOW (ref 6.5–8.1)

## 2017-10-02 LAB — CBC WITH DIFFERENTIAL/PLATELET
Basophils Absolute: 0 10*3/uL (ref 0.0–0.1)
Basophils Relative: 0 %
Eosinophils Absolute: 0 10*3/uL (ref 0.0–0.7)
Eosinophils Relative: 0 %
HCT: 36.5 % — ABNORMAL LOW (ref 39.0–52.0)
HEMOGLOBIN: 11.6 g/dL — AB (ref 13.0–17.0)
LYMPHS PCT: 10 %
Lymphs Abs: 0.6 10*3/uL — ABNORMAL LOW (ref 0.7–4.0)
MCH: 28.5 pg (ref 26.0–34.0)
MCHC: 31.8 g/dL (ref 30.0–36.0)
MCV: 89.7 fL (ref 78.0–100.0)
Monocytes Absolute: 0.8 10*3/uL (ref 0.1–1.0)
Monocytes Relative: 13 %
NEUTROS ABS: 4.7 10*3/uL (ref 1.7–7.7)
NEUTROS PCT: 77 %
Platelets: 248 10*3/uL (ref 150–400)
RBC: 4.07 MIL/uL — ABNORMAL LOW (ref 4.22–5.81)
RDW: 15 % (ref 11.5–15.5)
WBC: 6.1 10*3/uL (ref 4.0–10.5)

## 2017-10-02 LAB — GLUCOSE, CAPILLARY
Glucose-Capillary: 108 mg/dL — ABNORMAL HIGH (ref 65–99)
Glucose-Capillary: 151 mg/dL — ABNORMAL HIGH (ref 65–99)
Glucose-Capillary: 247 mg/dL — ABNORMAL HIGH (ref 65–99)

## 2017-10-02 LAB — MRSA PCR SCREENING: MRSA by PCR: NEGATIVE

## 2017-10-02 LAB — CBG MONITORING, ED: GLUCOSE-CAPILLARY: 95 mg/dL (ref 65–99)

## 2017-10-02 LAB — STREP PNEUMONIAE URINARY ANTIGEN: Strep Pneumo Urinary Antigen: NEGATIVE

## 2017-10-02 MED ORDER — INSULIN GLARGINE 100 UNIT/ML ~~LOC~~ SOLN
20.0000 [IU] | Freq: Every day | SUBCUTANEOUS | Status: DC
  Administered 2017-10-02: 20 [IU] via SUBCUTANEOUS
  Filled 2017-10-02 (×2): qty 0.2

## 2017-10-02 MED ORDER — CILOSTAZOL 100 MG PO TABS
100.0000 mg | ORAL_TABLET | Freq: Two times a day (BID) | ORAL | Status: DC
  Administered 2017-10-02 – 2017-10-03 (×3): 100 mg via ORAL
  Filled 2017-10-02 (×8): qty 1

## 2017-10-02 MED ORDER — ASPIRIN EC 81 MG PO TBEC
81.0000 mg | DELAYED_RELEASE_TABLET | Freq: Every day | ORAL | Status: DC
  Administered 2017-10-02 – 2017-10-03 (×2): 81 mg via ORAL
  Filled 2017-10-02 (×2): qty 1

## 2017-10-02 MED ORDER — ATORVASTATIN CALCIUM 40 MG PO TABS
40.0000 mg | ORAL_TABLET | Freq: Every day | ORAL | Status: DC
  Administered 2017-10-02: 40 mg via ORAL
  Filled 2017-10-02 (×2): qty 1

## 2017-10-02 MED ORDER — VITAMIN B-12 1000 MCG PO TABS
1000.0000 ug | ORAL_TABLET | Freq: Every day | ORAL | Status: DC
  Administered 2017-10-02 – 2017-10-03 (×2): 1000 ug via ORAL
  Filled 2017-10-02 (×3): qty 1

## 2017-10-02 MED ORDER — GABAPENTIN 300 MG PO CAPS
600.0000 mg | ORAL_CAPSULE | Freq: Every day | ORAL | Status: DC
  Administered 2017-10-02: 600 mg via ORAL
  Filled 2017-10-02: qty 2

## 2017-10-02 MED ORDER — FERROUS SULFATE 325 (65 FE) MG PO TABS
325.0000 mg | ORAL_TABLET | Freq: Every day | ORAL | Status: DC
  Administered 2017-10-02 – 2017-10-03 (×2): 325 mg via ORAL
  Filled 2017-10-02 (×3): qty 1

## 2017-10-02 MED ORDER — PANTOPRAZOLE SODIUM 40 MG PO TBEC
40.0000 mg | DELAYED_RELEASE_TABLET | Freq: Every day | ORAL | Status: DC
  Administered 2017-10-02 – 2017-10-03 (×2): 40 mg via ORAL
  Filled 2017-10-02 (×2): qty 1

## 2017-10-02 MED ORDER — GABAPENTIN 300 MG PO CAPS
300.0000 mg | ORAL_CAPSULE | Freq: Two times a day (BID) | ORAL | Status: DC
  Administered 2017-10-02: 300 mg via ORAL
  Filled 2017-10-02: qty 1

## 2017-10-02 MED ORDER — GABAPENTIN 300 MG PO CAPS
300.0000 mg | ORAL_CAPSULE | Freq: Every day | ORAL | Status: DC
  Administered 2017-10-03: 300 mg via ORAL
  Filled 2017-10-02: qty 1

## 2017-10-02 MED ORDER — VITAMIN D 1000 UNITS PO TABS
2000.0000 [IU] | ORAL_TABLET | Freq: Every day | ORAL | Status: DC
  Administered 2017-10-02 – 2017-10-03 (×2): 2000 [IU] via ORAL
  Filled 2017-10-02 (×3): qty 2

## 2017-10-02 NOTE — Progress Notes (Signed)
PROGRESS NOTE                                                                                                                                                                                                             Patient Demographics:    [name], is a 72 y.o. male, DOB - 05-05-1946, BMW:[id number]  Admit date - 10/01/2017   Admitting Physician No admitting provider for patient encounter.  Outpatient Primary MD for the patient is [name], MD  LOS - 1  Outpatient Specialists:  Chief Complaint  Patient presents with  . Fever       Brief Narrative 72 year old male with history of coronary artery disease with history of CABG x5, status post PTCA, peripheral vascular disease status post bilateral fem-fem bypass, GERD, hypertension, type 2 diabetes mellitus with peripheral neuropathy presented with fever with chills and productive cough for the past 2 days.  Patient was hospitalized 1 month back for sigmoid diverticulitis with sepsis and treated with 10-day course of Cipro and Flagyl. In the ED he was septic with fever of 103 F, tachycardic to 110s and mild acute kidney injury and high normal lactic acid of 1.9 meeting criteria for sepsis. Chest x-ray showed mild diffuse hazy peripheral lung opacity suggestive of possible pneumonia versus mild pulmonary edema. Blood cultures drawn in the ED and patient given 2 L IV normal saline bolus followed by  empiric Rocephin and azithromycin and admitted to hospitalist service.   Subjective:   Reports his cough to be better.  afebrile this morning.   Assessment  & Plan :    Principal Problem:   Sepsis due to healthcare associated pneumonia ([place]) Sepsis currently resolved.  Continue O2 via nasal cannula, empiric vancomycin, Levaquin and cefepime (narrow per blood culture results) , follow blood culture, urine strep ag and legionella ag.  As needed  bronchodilators.  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  Continue aspirin, Lipitor.  Resume beta-blocker as blood pressure stable.    Essential hypertension Metoprolol held due to transient hypotension in the ED.  Currently improved and will resume this morning.     Type 2 diabetes mellitus, uncontrolled with peripheral neuropathy (HCC) A1c of 9.21 May 2017.  Continue dose Lantus and sliding scale coverage.    Hypomagnesemia Replenished.    Hyponatremia Secondary  to pneumonia and dehydration.  Improved with IV fluids.    PAD (peripheral artery disease) (HCC) Continue aspirin and statin.        Code Status : Full code  Family Communication  : None at bedside  Disposition Plan  : Home once improved  Barriers For Discharge : Active symptoms  Consults  : None  Procedures  : None  DVT Prophylaxis  :  Lovenox -   Lab Results  Component Value Date   PLT 248 10/02/2017    Antibiotics  :  Anti-infectives (From admission, onward)   Start     Dose/Rate Route Frequency Ordered Stop   10/02/17 1030  vancomycin (VANCOCIN) IVPB 750 mg/150 ml premix     750 mg 150 mL/hr over 60 Minutes Intravenous Every 12 hours 10/01/17 2214     10/02/17 0000  ceFEPIme (MAXIPIME) 1 g in sodium chloride 0.9 % 100 mL IVPB     1 g 200 mL/hr over 30 Minutes Intravenous Every 8 hours 10/01/17 2350 10/09/17 2159   10/01/17 2230  vancomycin (VANCOCIN) 1,500 mg in sodium chloride 0.9 % 500 mL IVPB     1,500 mg 250 mL/hr over 120 Minutes Intravenous  Once 10/01/17 2214 10/02/17 0118   10/01/17 2145  levofloxacin (LEVAQUIN) IVPB 750 mg     750 mg 100 mL/hr over 90 Minutes Intravenous Every 24 hours 10/01/17 2144 10/06/17 2144   10/01/17 1830  cefTRIAXone (ROCEPHIN) 1 g in sodium chloride 0.9 % 100 mL IVPB     1 g 200 mL/hr over 30 Minutes Intravenous  Once 10/01/17 1825 10/01/17 1944   10/01/17 1830  azithromycin (ZITHROMAX) 500 mg in sodium chloride 0.9 % 250 mL IVPB   Status:  Discontinued     500 mg 250 mL/hr over 60 Minutes Intravenous Every 24 hours 10/01/17 1825 10/01/17 2144        Objective:   Vitals:   10/02/17 0600 10/02/17 0618 10/02/17 0619 10/02/17 0758  BP: 133/65 133/65  (!) 144/74  Pulse: 70 72 70 86  Resp: 12 18 12 14   Temp:      TempSrc:      SpO2: 96% 99% 100% 100%  Weight:      Height:        Wt Readings from Last 3 Encounters:  10/01/17 83.9 kg (185 lb)  09/12/17 70 kg (154 lb 6.4 oz)  09/10/17 70.8 kg (156 lb)     Intake/Output Summary (Last 24 hours) at 10/02/2017 0941 Last data filed at 10/02/2017 2130 Gross per 24 hour  Intake 4450 ml  Output -  Net 4450 ml     Physical Exam  Gen: not in distress HEENT: no pallor, moist mucosa, supple neck Chest: Coarse breath sounds over bilateral lung base CVS: N S1&S2, no murmurs,  GI: soft, NT, ND, BS+ Musculoskeletal: warm, no edema     Data Review:    CBC Recent Labs  Lab 10/01/17 1805 10/02/17 0618  WBC 9.3 6.1  HGB 11.4* 11.6*  HCT 34.9* 36.5*  PLT 258 248  MCV 88.4 89.7  MCH 28.9 28.5  MCHC 32.7 31.8  RDW 14.6 15.0  LYMPHSABS 0.4 0.6*  MONOABS 0.3 0.8  EOSABS 0.0 0.0  BASOSABS 0.0 0.0    Chemistries  Recent Labs  Lab 10/01/17 1805 10/02/17 0618  NA 133* 137  K 3.5 3.7  CL 103 109  CO2 20* 21*  GLUCOSE 278* 109*  BUN 17 15  CREATININE 1.44* 1.14  CALCIUM 7.8*  7.8*  MG 1.6*  --   AST 15 16  ALT 10* 10*  ALKPHOS 68 60  BILITOT 0.5 0.4   ------------------------------------------------------------------------------------------------------------------ No results for input(s): CHOL, HDL, LDLCALC, TRIG, CHOLHDL, LDLDIRECT in the last 72 hours.  Lab Results  Component Value Date   HGBA1C 9.8 (H) 05/31/2017   ------------------------------------------------------------------------------------------------------------------ No results for input(s): TSH, T4TOTAL, T3FREE, THYROIDAB in the last 72 hours.  Invalid input(s):  FREET3 ------------------------------------------------------------------------------------------------------------------ No results for input(s): VITAMINB12, FOLATE, FERRITIN, TIBC, IRON, RETICCTPCT in the last 72 hours.  Coagulation profile Recent Labs  Lab 10/01/17 1805  INR 1.08    No results for input(s): DDIMER in the last 72 hours.  Cardiac Enzymes No results for input(s): CKMB, TROPONINI, MYOGLOBIN in the last 168 hours.  Invalid input(s): CK ------------------------------------------------------------------------------------------------------------------    Component Value Date/Time   BNP 92.0 10/01/2017 1805    Inpatient Medications  Scheduled Meds: . aspirin EC  81 mg Oral Daily  . atorvastatin  40 mg Oral q1800  . cholecalciferol  2,000 Units Oral Daily  . cilostazol  100 mg Oral BID  . enoxaparin (LOVENOX) injection  40 mg Subcutaneous Q24H  . ferrous sulfate  325 mg Oral Daily  . gabapentin  300-600 mg Oral BID  . insulin aspart  0-15 Units Subcutaneous TID WC  . insulin glargine  20 Units Subcutaneous QHS  . ipratropium-albuterol  3 mL Nebulization Q6H  . pantoprazole  40 mg Oral Daily  . vitamin B-12  1,000 mcg Oral Daily   Continuous Infusions: . ceFEPime (MAXIPIME) IV Stopped (10/02/17 4132)  . levofloxacin (LEVAQUIN) IV Stopped (10/01/17 2359)  . vancomycin     PRN Meds:.acetaminophen **OR** acetaminophen, albuterol, ondansetron **OR** ondansetron (ZOFRAN) IV  Micro Results Recent Results (from the past 240 hour(s))  Culture, blood (Routine x 2)     Status: None (Preliminary result)   Collection Time: 10/01/17  6:05 PM  Result Value Ref Range Status   Specimen Description BLOOD  Final   Special Requests NONE  Final   Culture   Final    NO GROWTH < 24 HOURS Performed at [facility], [address] [place], [place] [address]    Report Status PENDING  Incomplete  Culture, blood (Routine x 2)     Status: None (Preliminary result)   Collection  Time: 10/01/17  6:53 PM  Result Value Ref Range Status   Specimen Description BLOOD LEFT HAND  Final   Special Requests   Final    BOTTLES DRAWN AEROBIC AND ANAEROBIC Blood Culture adequate volume   Culture   Final    NO GROWTH < 12 HOURS Performed at [facility], [address] [place], [place] [address]    Report Status PENDING  Incomplete    Radiology Reports Dg Chest Port 1 View  Result Date: 10/01/2017 CLINICAL DATA:  Fever EXAM: PORTABLE CHEST 1 VIEW COMPARISON:  08/16/2017 chest radiograph. FINDINGS: Intact sternotomy wires. Stable cardiomediastinal silhouette with mild cardiomegaly. No pneumothorax. No pleural effusion. Mild diffuse hazy and linear parahilar lung opacities. IMPRESSION: Mild diffuse hazy and linear parahilar lung opacities, favored to represent mild pulmonary edema due to congestive heart failure given the mild cardiomegaly, although the differential includes atypical pneumonia such as mycoplasma given history of fever. Electronically Signed   By: Ilona Sorrel M.D.   On: 10/01/2017 18:45     Time Spent in minutes  25   Datra Clary M.D on 10/02/2017 at 9:41 AM  Between 7am to 7pm - Pager -  [phone]  After 7pm go to [email or web address] - password [facility]  [facility] -  Office  [phone]

## 2017-10-03 DIAGNOSIS — J181 Lobar pneumonia, unspecified organism: Secondary | ICD-10-CM

## 2017-10-03 DIAGNOSIS — A419 Sepsis, unspecified organism: Principal | ICD-10-CM

## 2017-10-03 DIAGNOSIS — J189 Pneumonia, unspecified organism: Secondary | ICD-10-CM

## 2017-10-03 DIAGNOSIS — E1122 Type 2 diabetes mellitus with diabetic chronic kidney disease: Secondary | ICD-10-CM

## 2017-10-03 DIAGNOSIS — E871 Hypo-osmolality and hyponatremia: Secondary | ICD-10-CM

## 2017-10-03 DIAGNOSIS — N181 Chronic kidney disease, stage 1: Secondary | ICD-10-CM

## 2017-10-03 DIAGNOSIS — K219 Gastro-esophageal reflux disease without esophagitis: Secondary | ICD-10-CM

## 2017-10-03 DIAGNOSIS — I10 Essential (primary) hypertension: Secondary | ICD-10-CM

## 2017-10-03 LAB — BASIC METABOLIC PANEL
ANION GAP: 9 (ref 5–15)
BUN: 15 mg/dL (ref 6–20)
CHLORIDE: 104 mmol/L (ref 101–111)
CO2: 19 mmol/L — AB (ref 22–32)
Calcium: 8.2 mg/dL — ABNORMAL LOW (ref 8.9–10.3)
Creatinine, Ser: 1.24 mg/dL (ref 0.61–1.24)
GFR calc Af Amer: >60 mL/min (ref >60)
GFR calc non Af Amer: 57 mL/min — ABNORMAL LOW (ref >60)
GLUCOSE: 125 mg/dL — AB (ref 65–99)
POTASSIUM: 3.6 mmol/L (ref 3.5–5.1)
Sodium: 132 mmol/L — ABNORMAL LOW (ref 135–145)

## 2017-10-03 LAB — GLUCOSE, CAPILLARY
GLUCOSE-CAPILLARY: 239 mg/dL — AB (ref 65–99)
Glucose-Capillary: 114 mg/dL — ABNORMAL HIGH (ref 65–99)

## 2017-10-03 LAB — CBC WITH DIFFERENTIAL/PLATELET
BASOS PCT: 0 %
Basophils Absolute: 0 10*3/uL (ref 0.0–0.1)
EOS PCT: 2 %
Eosinophils Absolute: 0.2 10*3/uL (ref 0.0–0.7)
HEMATOCRIT: 33.7 % — AB (ref 39.0–52.0)
Hemoglobin: 11.1 g/dL — ABNORMAL LOW (ref 13.0–17.0)
Lymphocytes Relative: 10 %
Lymphs Abs: 0.8 10*3/uL (ref 0.7–4.0)
MCH: 28.9 pg (ref 26.0–34.0)
MCHC: 32.9 g/dL (ref 30.0–36.0)
MCV: 87.8 fL (ref 78.0–100.0)
MONOS PCT: 19 %
Monocytes Absolute: 1.6 10*3/uL — ABNORMAL HIGH (ref 0.1–1.0)
NEUTROS PCT: 69 %
Neutro Abs: 5.6 10*3/uL (ref 1.7–7.7)
Platelets: 284 10*3/uL (ref 150–400)
RBC: 3.84 MIL/uL — AB (ref 4.22–5.81)
RDW: 14.7 % (ref 11.5–15.5)
WBC: 8.2 10*3/uL (ref 4.0–10.5)

## 2017-10-03 LAB — LEGIONELLA PNEUMOPHILA SEROGP 1 UR AG: L. pneumophila Serogp 1 Ur Ag: NEGATIVE

## 2017-10-03 MED ORDER — LEVOFLOXACIN 750 MG PO TABS: 750.0000 mg | ORAL_TABLET | Freq: Every day | ORAL | 0 refills | Status: AC

## 2017-10-03 MED ORDER — ALBUTEROL SULFATE HFA 108 (90 BASE) MCG/ACT IN AERS: 2.0000 | INHALATION_SPRAY | Freq: Four times a day (QID) | RESPIRATORY_TRACT | 2 refills | Status: DC | PRN

## 2017-10-03 MED ORDER — IPRATROPIUM-ALBUTEROL 0.5-2.5 (3) MG/3ML IN SOLN
3.0000 mL | Freq: Three times a day (TID) | RESPIRATORY_TRACT | Status: DC
  Administered 2017-10-03: 3 mL via RESPIRATORY_TRACT
  Filled 2017-10-03: qty 3

## 2017-10-03 MED ORDER — GUAIFENESIN ER 600 MG PO TB12: 600.0000 mg | ORAL_TABLET | Freq: Two times a day (BID) | ORAL | 2 refills | Status: DC

## 2017-10-03 NOTE — Care Management Important Message (Signed)
Important Message  Patient Details  Name: APOLINAR BERO MRN: [id number] Date of Birth: July 19, 1946   Medicare Important Message Given:  Yes    Martena Emanuele, Chauncey [place], RN 10/03/2017, 11:26 AM

## 2017-10-03 NOTE — Progress Notes (Signed)
Discharge instructions reviewed with patient and wife.  Both verbalized understanding of instructions.  Patient discharged home with wife.

## 2017-10-03 NOTE — Care Management Note (Addendum)
Case Management Note  Patient Details  Name: [name] MRN: [id number] Date of Birth: July 25, 1946  Subjective/Objective:  Adm with sepsis pneumonia. From home with wife. Five admissions in six months.  Discussed options of [facility] RN and [facility] services. Daughter and wife present. Patient deferred to daughter who agrees to [place] calls only.  Patient goes to [place] in [place]. Daughter reports patient will soon have PCP at [facility] in [place].                   Action/Plan: DC home with self care. Enrolled in [facility] calls.    Expected Discharge Date:  10/05/17               Expected Discharge Plan:  Home/Self Care  In-House Referral:     Discharge planning Services  CM Consult  Post Acute Care Choice:    Choice offered to:  Patient, Spouse, Adult Children  DME Arranged:    DME Agency:     HH Arranged:    [place] Agency:     Status of Service:  Completed, signed off  If discussed at [employer] of Stay Meetings, dates discussed:    Additional Comments:  Sahan Pen, Chauncey Reading, RN 10/03/2017, 11:22 AM

## 2017-10-03 NOTE — Discharge Summary (Signed)
Physician Discharge Summary  [name] NFA:[id number] DOB: [birth year] DOA: 10/01/2017  PCP: [name], MD  Admit date: 10/01/2017 Discharge date: 10/03/2017  Admitted From: Home Disposition: Home  Recommendations for Outpatient Follow-up:  1. Follow up with PCP in 1-2 weeks 2. Please obtain BMP/CBC in one week 3. Follow-up chest x-ray in 4-6 weeks  Home Health: Patient offered home health, but has refused.  He is agreeable to [facility] phone calls. Equipment/Devices:  Discharge Condition: Stable CODE STATUS: Full code Diet recommendation: Heart Healthy / Carb Modified   Brief/Interim Summary: 72 year old male with history of coronary artery disease with history of CABG x5, status post PTCA, peripheral vascular disease status post bilateral fem-fem bypass, GERD, hypertension, type 2 diabetes mellitus with peripheral neuropathy presented with fever with chills and productive cough for the past 2 days.  Patient was hospitalized 1 month back for sigmoid diverticulitis with sepsis and treated with 10-day course of Cipro and Flagyl. In the ED he was septic with fever of 103 F, tachycardic to 110s and mild acute kidney injury and high normal lactic acid of 1.9 meeting criteria for sepsis. Chest x-ray showed mild diffuse hazy peripheral lung opacity suggestive of possible pneumonia versus mild pulmonary edema. Blood cultures drawn in the ED and patient given 2 L IV normal saline bolus followed by  empiric Rocephin and azithromycin and admitted to hospitalist service.    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 Sepsis due to healthcare associated pneumonia.  Treated with intravenous antibiotics and IV fluids.  Blood cultures did not show any growth.  He was treated empirically with vancomycin and Levaquin as well as cefepime.  This was de-escalated to oral Levaquin.  He was treated supportively with bronchodilators.  Overall respiratory status has improved.  He is now able to ambulate in his room, on room air without any difficulty.  He feels ready for discharge home.  He will be discharged with albuterol inhaler, supportive measures such as Mucinex as well as a course of oral Levaquin. 2. Coronary artery disease status post CABG.  Stable.  No complaints of chest pain.  Continue on aspirin, Lipitor and beta-blockers 3. Hypertension.  Metoprolol initially held due to transient hypotension in the emergency room.  This improved on discharge and metoprolol will be resumed. 4. Diabetes.  Continue Lantus.  A1c 9.8 from October 2018.  Discharge Instructions  Discharge Instructions    Diet - low sodium heart healthy   Complete by:  As directed    Increase activity slowly   Complete by:  As directed      Allergies as of 10/03/2017      Reactions   No Known Allergies       Medication List    TAKE these medications   albuterol 108 (90 Base) MCG/ACT inhaler Commonly known as:  PROVENTIL HFA;VENTOLIN HFA Inhale 2 puffs into the lungs every 6 (six) hours as needed for wheezing or shortness of breath.   aspirin 81 MG tablet Take 81 mg by mouth daily.   cilostazol 100 MG tablet Commonly known as:  PLETAL Take 100 mg by  mouth 2 (two) times daily.   ferrous sulfate 325 (65 FE) MG tablet Take 325 mg by mouth daily.   gabapentin 300 MG capsule Commonly known as:  NEURONTIN Take 300-600 mg by mouth 2 (two) times daily. 300mg  in the morning and 600mg  at bedtime   guaiFENesin 600 MG 12 hr tablet Commonly known as:  MUCINEX Take 1 tablet (600 mg total) by mouth 2 (two) times daily.   insulin  glargine 100 UNIT/ML injection Commonly known as:  LANTUS Inject 0.2 mLs (20 Units total) into the skin at bedtime.   levofloxacin 750 MG tablet Commonly known as:  LEVAQUIN Take 1 tablet (750 mg total) by mouth daily for 7 days.   metoprolol tartrate 25 MG tablet Commonly known as:  LOPRESSOR Take 25 mg by mouth 2 (two) times daily.   pantoprazole 40 MG tablet Commonly known as:  PROTONIX Take 1 tablet (40 mg total) by mouth daily.   simvastatin 80 MG tablet Commonly known as:  ZOCOR Take 40 mg by mouth at bedtime.   vitamin B-12 1000 MCG tablet Commonly known as:  CYANOCOBALAMIN Take 1,000 mcg by mouth daily.   Vitamin D 2000 units tablet Take 2,000 Units by mouth daily.      Follow-up Information    [name], MD Follow up.   Specialty:  Nurse Practitioner Why:  follow up with your primary care doctor in march as previously scheduled. You will need repeat chest xray on follow up Contact information: [address] [place] [address] [phone]          Allergies  Allergen Reactions  . No Known Allergies     Consultations:     Procedures/Studies:    Subjective: Feeling better today.  Still has some cough.  Overall shortness of breath is better.  Discharge Exam: Vitals:   10/03/17 0600 10/03/17 1011  BP: (!) 148/91   Pulse: 98   Resp:    Temp: 98.2 F (36.8 C)   SpO2: 97% 96%   Vitals:   10/02/17 1554 10/02/17 2103 10/03/17 0600 10/03/17 1011  BP:  (!) 130/52 (!) 148/91   Pulse:  94 98   Resp:      Temp:  99.5 F (37.5 C) 98.2 F (36.8 C)   TempSrc:  Oral Oral   SpO2: 96% 96% 97% 96%  Weight:      Height:        General: Pt is alert, awake, not in acute distress Cardiovascular: RRR, S1/S2 +, no rubs, no gallops Respiratory: CTA bilaterally, no wheezing, no rhonchi Abdominal: Soft, NT, ND, bowel sounds + Extremities: no edema, no cyanosis    The results of significant diagnostics from this hospitalization  (including imaging, microbiology, ancillary and laboratory) are listed below for reference.     Microbiology: Recent Results (from the past 240 hour(s))  Culture, blood (Routine x 2)     Status: None (Preliminary result)   Collection Time: 10/01/17  6:05 PM  Result Value Ref Range Status   Specimen Description BLOOD RIGHT ARM  Final   Special Requests   Final    BOTTLES DRAWN AEROBIC AND ANAEROBIC Blood Culture adequate volume   Culture   Final    NO GROWTH 2 DAYS Performed at [facility], [address] [place], [place] [address]    Report Status PENDING  Incomplete  Culture, blood (Routine x 2)     Status: None (Preliminary result)   Collection Time: 10/01/17  6:53 PM  Result  Value Ref Range Status   Specimen Description BLOOD LEFT HAND  Final   Special Requests   Final    BOTTLES DRAWN AEROBIC AND ANAEROBIC Blood Culture adequate volume   Culture   Final    NO GROWTH 2 DAYS Performed at [facility], [address], [place], [place] [address]    Report Status PENDING  Incomplete  MRSA PCR Screening     Status: None   Collection Time: 10/02/17 11:00 AM  Result Value Ref Range Status   MRSA by PCR NEGATIVE NEGATIVE Final    Comment:        The GeneXpert MRSA Assay (FDA approved for NASAL specimens only), is one component of a comprehensive MRSA colonization surveillance program. It is not intended to diagnose MRSA infection nor to guide or monitor treatment for MRSA infections. Performed at [facility], [address] [place], [place] [address]      Labs: BNP (last 3 results) Recent Labs    08/16/17 2123 10/01/17 1805  BNP 47.0 94.7   Basic Metabolic Panel: Recent Labs  Lab 10/01/17 1805 10/02/17 0618 10/03/17 0544  NA 133* 137 132*  K 3.5 3.7 3.6  CL 103 109 104  CO2 20* 21* 19*  GLUCOSE 278* 109* 125*  BUN 17 15 15   CREATININE 1.44* 1.14 1.24  CALCIUM 7.8* 7.8* 8.2*  MG 1.6*  --   --   PHOS 3.2  --   --    Liver Function Tests: Recent Labs   Lab 10/01/17 1805 10/02/17 0618  AST 15 16  ALT 10* 10*  ALKPHOS 68 60  BILITOT 0.5 0.4  PROT 5.5* 5.2*  ALBUMIN 2.6* 2.4*   No results for input(s): LIPASE, AMYLASE in the last 168 hours. No results for input(s): AMMONIA in the last 168 hours. CBC: Recent Labs  Lab 10/01/17 1805 10/02/17 0618 10/03/17 0544  WBC 9.3 6.1 8.2  NEUTROABS 8.6 4.7 5.6  HGB 11.4* 11.6* 11.1*  HCT 34.9* 36.5* 33.7*  MCV 88.4 89.7 87.8  PLT 258 248 284   Cardiac Enzymes: No results for input(s): CKTOTAL, CKMB, CKMBINDEX, TROPONINI in the last 168 hours. BNP: Invalid input(s): POCBNP CBG: Recent Labs  Lab 10/02/17 1054 10/02/17 1625 10/02/17 2209 10/03/17 0715 10/03/17 1118  GLUCAP 108* 151* 247* 114* 239*   D-Dimer No results for input(s): DDIMER in the last 72 hours. Hgb A1c No results for input(s): HGBA1C in the last 72 hours. Lipid Profile No results for input(s): CHOL, HDL, LDLCALC, TRIG, CHOLHDL, LDLDIRECT in the last 72 hours. Thyroid function studies No results for input(s): TSH, T4TOTAL, T3FREE, THYROIDAB in the last 72 hours.  Invalid input(s): FREET3 Anemia work up No results for input(s): VITAMINB12, FOLATE, FERRITIN, TIBC, IRON, RETICCTPCT in the last 72 hours. Urinalysis    Component Value Date/Time   COLORURINE YELLOW 10/01/2017 1805   APPEARANCEUR CLEAR 10/01/2017 1805   LABSPEC 1.022 10/01/2017 1805   PHURINE 5.0 10/01/2017 1805   GLUCOSEU >=500 (A) 10/01/2017 1805   HGBUR SMALL (A) 10/01/2017 1805   BILIRUBINUR NEGATIVE 10/01/2017 1805   KETONESUR NEGATIVE 10/01/2017 1805   PROTEINUR NEGATIVE 10/01/2017 1805   UROBILINOGEN 0.2 03/16/2015 0120   NITRITE NEGATIVE 10/01/2017 1805   LEUKOCYTESUR NEGATIVE 10/01/2017 1805   Sepsis Labs Invalid input(s): PROCALCITONIN,  WBC,  LACTICIDVEN Microbiology Recent Results (from the past 240 hour(s))  Culture, blood (Routine x 2)     Status: None (Preliminary result)   Collection Time: 10/01/17  6:05 PM  Result  Value Ref Range Status   Specimen Description BLOOD RIGHT ARM  Final   Special Requests   Final    BOTTLES DRAWN AEROBIC AND ANAEROBIC Blood Culture adequate volume   Culture   Final    NO GROWTH 2 DAYS Performed at [facility], [address] [place], [place] [address]    Report Status PENDING  Incomplete  Culture, blood (Routine x 2)     Status: None (Preliminary result)   Collection Time: 10/01/17  6:53 PM  Result Value Ref Range Status   Specimen Description BLOOD LEFT HAND  Final   Special Requests   Final    BOTTLES DRAWN AEROBIC AND ANAEROBIC Blood Culture adequate volume   Culture   Final    NO GROWTH 2 DAYS Performed at [facility], [address] [place], [place] [address]    Report Status PENDING  Incomplete  MRSA PCR Screening     Status: None   Collection Time: 10/02/17 11:00 AM  Result Value Ref Range Status   MRSA by PCR NEGATIVE NEGATIVE Final    Comment:        The GeneXpert MRSA Assay (FDA approved for NASAL specimens only), is one component of a comprehensive MRSA colonization surveillance program. It is not intended to diagnose MRSA infection nor to guide or monitor treatment for MRSA infections. Performed at [facility], [address], [place],  [address]      Time coordinating discharge: Over 30 minutes  SIGNED:   Kathie Dike, MD  [facility] 10/03/2017, 8:12 PM Pager   If 7PM-7AM, please contact night-coverage [email or web address] Password [email or web address]

## 2017-10-06 LAB — CULTURE, BLOOD (ROUTINE X 2)
CULTURE: NO GROWTH
Culture: NO GROWTH
Special Requests: ADEQUATE
Special Requests: ADEQUATE

## 2017-10-08 ENCOUNTER — Other Ambulatory Visit: Payer: Self-pay

## 2017-10-08 NOTE — Patient Outreach (Signed)
[facility]) Care Management  10/08/2017  [name] 1946/01/26 [id number]   EMMI: pneumonia Referral date: 10/08/17 Referral source: EMMI pneumonia red alert Referral reason: wheezing more, fever chills, had diarrhea or felt sick to stomach Day # 2  Telephone call to patient regarding EMMI pneumonia red alert. HIPAA verified with patient. Discussed EMMI pneumonia program with patient. Patient states, " that call thing does not take the answers correctly." Patient states he is not having any problems or symptoms. States, "I am doing fine."  Patient states he would like to have the EMMI  calls discontinued.  Patient states he has an appointment with his primary MD on 10/23/17 and an appointment with Dr. Sydell Axon on 10/15/17. Patient states he is scheduled to have a colonoscopy in approximately 2 weeks. Patient denies any shortness of breath,  productive cough or fever. Patient states he continues to take her antibiotics as prescribed. Advised patient to continue to take his antibiotics to completion.   Patient states he is independent in his care and provides his own transportation. Patient states he knows how to contact his doctors if needed.  RNCM advised patient to notify MD of any changes in condition prior to scheduled appointment. RNCM provided contact name and number:  24 hour nurse advise line [phone].  RNCM verified patient aware of 911 services for urgent/ emergent needs.   PLAN: RNCM will refer patient to care management assistant to close due to patient being assessed and having no further needs.  RNCM will request care management discontinue EMMI pneumonia calls at the request of patient.  RNCM will send patients primary MD closure letter.   Quinn Plowman RN,BSN,CCM [facility] Teleph[facility]  [phone]

## 2017-10-12 ENCOUNTER — Encounter: Payer: Self-pay | Admitting: Nurse Practitioner

## 2017-10-12 ENCOUNTER — Ambulatory Visit: Payer: Medicare Other | Admitting: Nurse Practitioner

## 2017-10-12 ENCOUNTER — Telehealth: Payer: Self-pay | Admitting: Nurse Practitioner

## 2017-10-12 NOTE — Telephone Encounter (Signed)
Noted  

## 2017-10-12 NOTE — Progress Notes (Deleted)
Referring Provider: [name], MD Primary Care Physician:  [name], MD Primary GI:  Dr. Gala Romney  No chief complaint on file.   HPI:   [name] is a 72 y.o. male who presents for follow-up office visit for diverticulitis and GERD.  The patient was last seen in our office 09/12/2017 for the same.  Last visit it was noted he was recently admitted for sepsis and diverticulitis.  IR did not feel he had a sigmoid abscess and not comfortable with drain placement.  Surgery agreed.  His sepsis resolved and recommended outpatient colonoscopy when acute infection had resolved.  He was discharged on antibiotics.  Previous colonoscopy 09/22/2013 with marginal prep which eventually became adequate as stool was lavaged and suctioned out.  Essentially normal and recommended 10-year repeat.  At his last visit he was doing well, noted diverticulitis with his first episode in many many years.  Dark stools on iron.  Abdominal pain resolved.  GERD well managed on PPI.  No other GI symptoms.  Recommended colonoscopy mid March, follow-up end of February to ensure he is still doing well prior to colonoscopy.  Continue PPI.  Today he states   Past Medical History:  Diagnosis Date  . CAD S/P percutaneous coronary angioplasty 1998   PCI TO CX  . Diabetes mellitus without complication ([place])   . Diverticulitis 08/16/2017   hospitalized with diverticulitis/sepsis  . GERD (gastroesophageal reflux disease)   . Headache   . 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 Hypercholesteremia   . Hypertension   . Neuropathy   . Peripheral vascular disease ([place])    s/p R-L FEM-FEM BYPASS  . Tachycardia    after CABG, pt on medicine for this    Past Surgical History:  Procedure Laterality Date  . ABDOMINAL AORTOGRAM Bilateral 09/19/2016   Procedure: iliac;  Surgeon: Serafina Mitchell, MD;  Location: [facility];  Service: Cardiovascular;  Laterality: Bilateral;  . CARDIAC CATHETERIZATION  2003   with stent  .  CARDIAC CATHETERIZATION N/A 10/11/2015   Procedure: Left Heart Cath and Coronary Angiography;  Surgeon: Peter M Martinique, MD;  Location: [place] [facility];  Service: Cardiovascular;  Laterality: N/A;  . COLONOSCOPY N/A 09/22/2013   Procedure: COLONOSCOPY;  Surgeon: Daneil Dolin, MD;  [facility];  Service: Endoscopy;  Laterality: N/A;  9:30 AM  . CORONARY ARTERY BYPASS GRAFT N/A 10/18/2015   Procedure: CORONARY ARTERY BYPASS GRAFTING (CABG) x  five, using left internal mammary artery and right leg greater saphenous vein harvested endoscopically;  Surgeon: [name], MD;  Location: [place];  Service: Open Heart Surgery;  Laterality: N/A;  . ENDARTERECTOMY FEMORAL Right 08/09/2016   Procedure: ENDARTERECTOMY FEMORAL WITH VEIN PATCH ANGIOPLASTY;  Surgeon: Serafina Mitchell, MD;  Location: [place];  Service: Vascular;  Laterality: Right;  . FEMORAL-FEMORAL BYPASS GRAFT Bilateral 08/09/2016   Procedure: REVISION BYPASS GRAFT RIGHT FEMORAL-LEFT FEMORAL ARTERY;  Surgeon: Serafina Mitchell, MD;  Location: [place];  Service: Vascular;  Laterality: Bilateral;  . FEMORAL-POPLITEAL BYPASS GRAFT    . PERIPHERAL VASCULAR CATHETERIZATION N/A 05/08/2016   Procedure: Lower Extremity Angiography;  Surgeon: Lorretta Harp, MD;  Location: [place] [facility];  Service: Cardiovascular;  Laterality: N/A;  . PERIPHERAL VASCULAR INTERVENTION Right 09/19/2016   Procedure: Peripheral Vascular Intervention;  Surgeon: Serafina Mitchell, MD;  Location: [place] [facility];  Service: Cardiovascular;  Laterality: Right;  ext iliac stent  .  TEE WITHOUT CARDIOVERSION N/A 10/18/2015   Procedure: TRANSESOPHAGEAL ECHOCARDIOGRAM (TEE);  Surgeon: [name], MD;  Location: [place];  Service: Open Heart Surgery;  Laterality: N/A;    Current Outpatient Medications  Medication Sig Dispense Refill  . albuterol (PROVENTIL HFA;VENTOLIN HFA) 108 (90 Base) MCG/ACT inhaler Inhale 2 puffs into the lungs every 6 (six) hours as needed  for wheezing or shortness of breath. 1 Inhaler 2  . aspirin 81 MG tablet Take 81 mg by mouth daily.    . Cholecalciferol (VITAMIN D) 2000 UNITS tablet Take 2,000 Units by mouth daily.    . cilostazol (PLETAL) 100 MG tablet Take 100 mg by mouth 2 (two) times daily.    . ferrous sulfate 325 (65 FE) MG tablet Take 325 mg by mouth daily.     [name] gabapentin (NEURONTIN) 300 MG capsule Take 300-600 mg by mouth 2 (two) times daily. 300mg  in the morning and 600mg  at bedtime    . guaiFENesin (MUCINEX) 600 MG 12 hr tablet Take 1 tablet (600 mg total) by mouth 2 (two) times daily. 60 tablet 2  . insulin glargine (LANTUS) 100 UNIT/ML injection Inject 0.2 mLs (20 Units total) into the skin at bedtime. 10 mL 11  . metoprolol tartrate (LOPRESSOR) 25 MG tablet Take 25 mg by mouth 2 (two) times daily.    . pantoprazole (PROTONIX) 40 MG tablet Take 1 tablet (40 mg total) by mouth daily. 5 tablet 0  . simvastatin (ZOCOR) 80 MG tablet Take 40 mg by mouth at bedtime.     . vitamin B-12 (CYANOCOBALAMIN) 1000 MCG tablet Take 1,000 mcg by mouth daily.     No current facility-administered medications for this visit.     Allergies as of 10/12/2017 - Review Complete 10/02/2017  Allergen Reaction Noted  . No known allergies  08/08/2016    Family History  Problem Relation Age of Onset  . Heart attack Mother   . Stroke Mother   . Colon cancer Neg Hx     Social History   Socioeconomic History  . Marital status: Married    Spouse name: Not on file  . Number of children: Not on file  . Years of education: Not on file  . Highest education level: Not on file  Social Needs  . Financial resource strain: Not on file  . Food insecurity - worry: Not on file  . Food insecurity - inability: Not on file  . Transportation needs - medical: Not on file  . Transportation needs - non-medical: Not on file  Occupational History  . Not on file  Tobacco Use  . Smoking status: Former Smoker    Packs/day: 2.00    Years: 30.00     Pack years: 60.00    Types: Cigarettes    Last attempt to quit: 08/14/2000    Years since quitting: 17.1  . Smokeless tobacco: Never Used  Substance and Sexual Activity  . Alcohol use: No  . Drug use: No  . Sexual activity: Not on file  Other Topics Concern  . Not on file  Social History Narrative  . Not on file    Review of Systems: General: Negative for anorexia, weight loss, fever, chills, fatigue, weakness. Eyes: Negative for vision changes.  ENT: Negative for hoarseness, difficulty swallowing , nasal congestion. CV: Negative for chest pain, angina, palpitations, dyspnea on exertion, peripheral edema.  Respiratory: Negative for dyspnea at rest, dyspnea on exertion, cough, sputum, wheezing.  GI: See history of present illness. GU:  Negative for dysuria, hematuria, urinary incontinence, urinary frequency, nocturnal urination.  MS: Negative for joint pain, low back pain.  Derm: Negative for rash or itching.  Neuro: Negative for weakness, abnormal sensation, seizure, frequent headaches, memory loss, confusion.  Psych: Negative for anxiety, depression, suicidal ideation, hallucinations.  Endo: Negative for unusual weight change.  Heme: Negative for bruising or bleeding. Allergy: Negative for rash or hives.   Physical Exam: There were no vitals taken for this visit. General:   Alert and oriented. Pleasant and cooperative. Well-nourished and well-developed.  Head:  Normocephalic and atraumatic. Eyes:  Without icterus, sclera clear and conjunctiva pink.  Ears:  Normal auditory acuity. Mouth:  No deformity or lesions, oral mucosa pink.  Throat/Neck:  Supple, without mass or thyromegaly. Cardiovascular:  S1, S2 present without murmurs appreciated. Normal pulses noted. Extremities without clubbing or edema. Respiratory:  Clear to auscultation bilaterally. No wheezes, rales, or rhonchi. No distress.  Gastrointestinal:  +BS, soft, non-tender and non-distended. No HSM noted. No  guarding or rebound. No masses appreciated.  Rectal:  Deferred  Musculoskalatal:  Symmetrical without gross deformities. Normal posture. Skin:  Intact without significant lesions or rashes. Neurologic:  Alert and oriented x4;  grossly normal neurologically. Psych:  Alert and cooperative. Normal mood and affect. Heme/Lymph/Immune: No significant cervical adenopathy. No excessive bruising noted.    10/12/2017 10:13 AM   Disclaimer: This note was dictated with voice recognition software. Similar sounding words can inadvertently be transcribed and may not be corrected upon review.

## 2017-10-12 NOTE — Telephone Encounter (Signed)
PATIENT WAS A NO SHOW AND LETTER SENT  °

## 2017-10-19 ENCOUNTER — Encounter: Payer: Self-pay | Admitting: Nurse Practitioner

## 2017-10-19 ENCOUNTER — Ambulatory Visit: Payer: Medicare Other | Admitting: Nurse Practitioner

## 2017-10-19 ENCOUNTER — Other Ambulatory Visit: Payer: Self-pay | Admitting: *Deleted

## 2017-10-19 VITALS — BP 136/72 | HR 74 | Temp 97.0°F | Ht 66.0 in | Wt 153.4 lb

## 2017-10-19 DIAGNOSIS — K219 Gastro-esophageal reflux disease without esophagitis: Secondary | ICD-10-CM

## 2017-10-19 DIAGNOSIS — K5732 Diverticulitis of large intestine without perforation or abscess without bleeding: Secondary | ICD-10-CM

## 2017-10-19 DIAGNOSIS — K5720 Diverticulitis of large intestine with perforation and abscess without bleeding: Secondary | ICD-10-CM | POA: Diagnosis not present

## 2017-10-19 NOTE — Patient Instructions (Addendum)
1. Keep your appointment for your colonoscopy that is already scheduled. 2. Continue your current medications including Protonix. 3. We will add on an upper endoscopy. 4. Further recommendations will be made after your procedures. 5. I have ordered a swallowing test to look for reflux and narrowing as well to see if you are having reflux high enough to make it a risk for aspiration. 6. Follow-up in 2 months. 7. Call if you have any questions or concerns.

## 2017-10-19 NOTE — H&P (View-Only) (Signed)
Referring Provider: [name], MD Primary Care Physician:  [name], MD Primary GI:  Dr. Gala Romney  Chief Complaint  Patient presents with  . Gastroesophageal Reflux    f/u. recently in hosp w/ PNA and they feel his reflux is causing him to aspirate and requesting EGD to be done with TCS  . Diverticulitis    f/u.     HPI:   [name] is a 71 y.o. male who presents for follow-up on diverticulitis and GERD.  The patient was last seen in our office 09/12/2017 for the same.  That time it was noted he had recently been admitted with acute diverticulitis with likely perforation and abscess.  Recommended follow-up to exclude underlying neoplasm.  He was doing well at his last visit although it is only been about 2 weeks since his discharge.  Previous colonoscopy 09/22/2013 with marginal prep eventually becoming adequate with liquid stool lavage and suctioning.  Noted normal rectum, extensive left-sided diverticula, remaining colon normal.  At his last visit it was noted this is his first diverticulitis episode in many years.  No abdominal pain, nausea, vomiting, hematochezia.  Some dark stools on iron.  No fever, chills, unintentional weight loss.  GERD well managed on Protonix.  Recommended colonoscopy after 8 weeks post diverticulitis.  Recommended follow-up at the end of February to ensure everything is still doing well prior to colonoscopy.  Otherwise, continue current medications call with any questions or concerns.  He was recently admitted to the hospital 10/01/2017 through 10/03/2017 for bilateral pneumonia.  He ended up having sepsis due to healthcare associated pneumonia.  Treated with antibiotics and respiratory status improved.  Today he states he's doing well overall. He states during his hospitalization for pneumonia they recommended he ask [place] to add an EGD for possible GERD causing aspiration pneumonia due to repeated pneumonia (3 episodes last year and one this year). Has not had  any recent GERD symptoms. He feels his GERD is doing well and denies epigastric pain, N/V, esophageal burning. Denies abdominal pain, N/V. Had an episode of black stools about 3 days ago. Denies fever, chills (other than when he had pneumonia), unintentional weight loss. Denies any ongoing cough or dyspnea. Denies chest pain, dyspnea, dizziness, lightheadedness, syncope, near syncope. Denies any other upper or lower GI symptoms.  Is on Protonix 40 mg daily.  Past Medical History:  Diagnosis Date  . CAD S/P percutaneous coronary angioplasty 1998   PCI TO CX  . Diabetes mellitus without complication ([place])   . Diverticulitis 08/16/2017   hospitalized with diverticulitis/sepsis  . GERD (gastroesophageal reflux disease)   . Headache   . 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 Hypercholesteremia   . Hypertension   . Neuropathy   . Peripheral vascular disease ([place])    s/p R-L FEM-FEM BYPASS  . Pneumonia   . Tachycardia    after CABG, pt on medicine for this    Past Surgical History:  Procedure Laterality Date  . ABDOMINAL AORTOGRAM Bilateral 09/19/2016   Procedure: iliac;  Surgeon: Serafina Mitchell, MD;  Location: [place] [facility];  Service: Cardiovascular;  Laterality: Bilateral;  . CARDIAC CATHETERIZATION  2003   with stent  . CARDIAC CATHETERIZATION N/A 10/11/2015   Procedure: Left Heart Cath and Coronary Angiography;  Surgeon: Peter M Martinique, MD;  Location: [place] [facility];  Service: Cardiovascular;  Laterality: N/A;  . COLONOSCOPY N/A 09/22/2013   Procedure: COLONOSCOPY;  Surgeon: Cristopher Estimable  Rourk, MD;  [facility];  Service: Endoscopy;  Laterality: N/A;  9:30 AM  . CORONARY ARTERY BYPASS GRAFT N/A 10/18/2015   Procedure: CORONARY ARTERY BYPASS GRAFTING (CABG) x  five, using left internal mammary artery and right leg greater saphenous vein harvested endoscopically;  Surgeon: [name], MD;  Location: [place];  Service: Open Heart Surgery;  Laterality: N/A;  . ENDARTERECTOMY  FEMORAL Right 08/09/2016   Procedure: ENDARTERECTOMY FEMORAL WITH VEIN PATCH ANGIOPLASTY;  Surgeon: Serafina Mitchell, MD;  Location: [place];  Service: Vascular;  Laterality: Right;  . FEMORAL-FEMORAL BYPASS GRAFT Bilateral 08/09/2016   Procedure: REVISION BYPASS GRAFT RIGHT FEMORAL-LEFT FEMORAL ARTERY;  Surgeon: Serafina Mitchell, MD;  Location: [place];  Service: Vascular;  Laterality: Bilateral;  . FEMORAL-POPLITEAL BYPASS GRAFT    . PERIPHERAL VASCULAR CATHETERIZATION N/A 05/08/2016   Procedure: Lower Extremity Angiography;  Surgeon: Lorretta Harp, MD;  Location: [place] [facility];  Service: Cardiovascular;  Laterality: N/A;  . PERIPHERAL VASCULAR INTERVENTION Right 09/19/2016   Procedure: Peripheral Vascular Intervention;  Surgeon: Serafina Mitchell, MD;  Location: [place] [facility];  Service: Cardiovascular;  Laterality: Right;  ext iliac stent  . TEE WITHOUT CARDIOVERSION N/A 10/18/2015   Procedure: TRANSESOPHAGEAL ECHOCARDIOGRAM (TEE);  Surgeon: [name], MD;  Location: [place];  Service: Open Heart Surgery;  Laterality: N/A;    Current Outpatient Medications  Medication Sig Dispense Refill  . albuterol (PROVENTIL HFA;VENTOLIN HFA) 108 (90 Base) MCG/ACT inhaler Inhale 2 puffs into the lungs every 6 (six) hours as needed for wheezing or shortness of breath. 1 Inhaler 2  . aspirin 81 MG tablet Take 81 mg by mouth daily.    . Cholecalciferol (VITAMIN D) 2000 UNITS tablet Take 2,000 Units by mouth daily.    . cilostazol (PLETAL) 100 MG tablet Take 100 mg by mouth 2 (two) times daily.    . ferrous sulfate 325 (65 FE) MG tablet Take 325 mg by mouth daily.     [name] gabapentin (NEURONTIN) 300 MG capsule Take 300-600 mg by mouth 2 (two) times daily. 300mg  in the morning and 600mg  at bedtime    . guaiFENesin (MUCINEX) 600 MG 12 hr tablet Take 1 tablet (600 mg total) by mouth 2 (two) times daily. 60 tablet 2  . insulin glargine (LANTUS) 100 UNIT/ML injection Inject 0.2 mLs (20 Units total) into the  skin at bedtime. 10 mL 11  . metoprolol tartrate (LOPRESSOR) 25 MG tablet Take 25 mg by mouth 2 (two) times daily.    . pantoprazole (PROTONIX) 40 MG tablet Take 1 tablet (40 mg total) by mouth daily. 5 tablet 0  . simvastatin (ZOCOR) 80 MG tablet Take 40 mg by mouth at bedtime.     . vitamin B-12 (CYANOCOBALAMIN) 1000 MCG tablet Take 1,000 mcg by mouth daily.     No current facility-administered medications for this visit.     Allergies as of 10/19/2017 - Review Complete 10/19/2017  Allergen Reaction Noted  . No known allergies  08/08/2016    Family History  Problem Relation Age of Onset  . Heart attack Mother   . Stroke Mother   . Colon cancer Neg Hx     Social History   Socioeconomic History  . Marital status: Married    Spouse name: None  . Number of children: None  . Years of education: None  . Highest education level: None  Social Needs  . Financial resource strain: None  . Food insecurity -  worry: None  . Food insecurity - inability: None  . Transportation needs - medical: None  . Transportation needs - non-medical: None  Occupational History  . None  Tobacco Use  . Smoking status: Former Smoker    Packs/day: 2.00    Years: 30.00    Pack years: 60.00    Types: Cigarettes    Last attempt to quit: 08/14/2000    Years since quitting: 17.1  . Smokeless tobacco: Never Used  Substance and Sexual Activity  . Alcohol use: No  . Drug use: No  . Sexual activity: None  Other Topics Concern  . None  Social History Narrative  . None    Review of Systems: General: Negative for anorexia, weight loss, fever, chills, fatigue, weakness. ENT: Negative for hoarseness, difficulty swallowing , nasal congestion. CV: Negative for chest pain, angina, palpitations, dyspnea on exertion, peripheral edema.  Respiratory: Negative for dyspnea at rest, dyspnea on exertion, cough, sputum, wheezing.  GI: See history of present illness. Endo: Negative for unusual weight change.    Heme: Negative for bruising or bleeding. Allergy: Negative for rash or hives.   Physical Exam: BP 136/72   Pulse 74   Temp (!) 97 F (36.1 C) (Oral)   Ht 5\' 6"  (1.676 m)   Wt 153 lb 6.4 oz (69.6 kg)   BMI 24.76 kg/m  General:   Alert and oriented. Pleasant and cooperative. Well-nourished and well-developed.  Eyes:  Without icterus, sclera clear and conjunctiva pink.  Ears:  Normal auditory acuity. Cardiovascular:  S1, S2 present without murmurs appreciated. Extremities without clubbing or edema. Respiratory:  Clear to auscultation bilaterally. No wheezes, rales, or rhonchi. No distress.  Gastrointestinal:  +BS, soft, non-tender and non-distended. No HSM noted. No guarding or rebound. No masses appreciated.  Rectal:  Deferred  Musculoskalatal:  Symmetrical without gross deformities. Skin:  Intact without significant lesions or rashes. Neurologic:  Alert and oriented x4;  grossly normal neurologically. Psych:  Alert and cooperative. Normal mood and affect. Heme/Lymph/Immune: No excessive bruising noted.    10/19/2017 11:39 AM   Disclaimer: This note was dictated with voice recognition software. Similar sounding words can inadvertently be transcribed and may not be corrected upon review.

## 2017-10-19 NOTE — Assessment & Plan Note (Signed)
The patient has a history of GERD which he feels generally is well controlled.  Although, he did have some issues a couple weeks ago.  His wife feels the symptoms are not as well-controlled as he indicates.  He has had repeated admissions for pneumonia.  He was told by the hospitalist to question whether an EGD could be added to his colonoscopy due to possible reflux and aspiration pneumonia.  He did note some dark stools, although he is on iron.  Query an episode of self-limiting melena.  He is on a PPI.  At this point I will add an EGD onto his already scheduled colonoscopy in order to check for signs of reflux, possible stricture, web, ring, or other physiological process that could explain worsening GERD/reflux at the pose a risk for aspiration pneumonia.  Also check a barium pill esophagram for functional reflux high enough to pose an aspiration risk.  Follow-up in 2 months.  Proceed with EGD with Dr. Gala Romney in near future: the risks, benefits, and alternatives have been discussed with the patient in detail. The patient states understanding and desires to proceed.  The patient is not on any anticoagulants, anxiolytics, chronic pain medications, or antidepressants.  Conscious sedation should be adequate for his procedure (as previously recommended for his colonoscopy)

## 2017-10-19 NOTE — Assessment & Plan Note (Signed)
Follow-up on acute diverticulitis with hospitalization.  He continues without symptoms.  We will proceed with previously scheduled colonoscopy.

## 2017-10-19 NOTE — Progress Notes (Signed)
Referring Provider: [name], MD Primary Care Physician:  [name], MD Primary GI:  Dr. Gala Romney  Chief Complaint  Patient presents with  . Gastroesophageal Reflux    f/u. recently in hosp w/ PNA and they feel his reflux is causing him to aspirate and requesting EGD to be done with TCS  . Diverticulitis    f/u.     HPI:   [name] is a 72 y.o. male who presents for follow-up on diverticulitis and GERD.  The patient was last seen in our office 09/12/2017 for the same.  That time it was noted he had recently been admitted with acute diverticulitis with likely perforation and abscess.  Recommended follow-up to exclude underlying neoplasm.  He was doing well at his last visit although it is only been about 2 weeks since his discharge.  Previous colonoscopy 09/22/2013 with marginal prep eventually becoming adequate with liquid stool lavage and suctioning.  Noted normal rectum, extensive left-sided diverticula, remaining colon normal.  At his last visit it was noted this is his first diverticulitis episode in many years.  No abdominal pain, nausea, vomiting, hematochezia.  Some dark stools on iron.  No fever, chills, unintentional weight loss.  GERD well managed on Protonix.  Recommended colonoscopy after 8 weeks post diverticulitis.  Recommended follow-up at the end of February to ensure everything is still doing well prior to colonoscopy.  Otherwise, continue current medications call with any questions or concerns.  He was recently admitted to the hospital 10/01/2017 through 10/03/2017 for bilateral pneumonia.  He ended up having sepsis due to healthcare associated pneumonia.  Treated with antibiotics and respiratory status improved.  Today he states he's doing well overall. He states during his hospitalization for pneumonia they recommended he ask [place] to add an EGD for possible GERD causing aspiration pneumonia due to repeated pneumonia (3 episodes last year and one this year). Has not had  any recent GERD symptoms. He feels his GERD is doing well and denies epigastric pain, N/V, esophageal burning. Denies abdominal pain, N/V. Had an episode of black stools about 3 days ago. Denies fever, chills (other than when he had pneumonia), unintentional weight loss. Denies any ongoing cough or dyspnea. Denies chest pain, dyspnea, dizziness, lightheadedness, syncope, near syncope. Denies any other upper or lower GI symptoms.  Is on Protonix 40 mg daily.  Past Medical History:  Diagnosis Date  . CAD S/P percutaneous coronary angioplasty 1998   PCI TO CX  . Diabetes mellitus without complication ([place])   . Diverticulitis 08/16/2017   hospitalized with diverticulitis/sepsis  . GERD (gastroesophageal reflux disease)   . Headache   . 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 Hypercholesteremia   . Hypertension   . Neuropathy   . Peripheral vascular disease ([place])    s/p R-L FEM-FEM BYPASS  . Pneumonia   . Tachycardia    after CABG, pt on medicine for this    Past Surgical History:  Procedure Laterality Date  . ABDOMINAL AORTOGRAM Bilateral 09/19/2016   Procedure: iliac;  Surgeon: Serafina Mitchell, MD;  Location: [facility];  Service: Cardiovascular;  Laterality: Bilateral;  . CARDIAC CATHETERIZATION  2003   with stent  . CARDIAC CATHETERIZATION N/A 10/11/2015   Procedure: Left Heart Cath and Coronary Angiography;  Surgeon: Peter M Martinique, MD;  Location: [facility];  Service: Cardiovascular;  Laterality: N/A;  . COLONOSCOPY N/A 09/22/2013   Procedure: COLONOSCOPY;  Surgeon: Cristopher Estimable  Rourk, MD;  [facility];  Service: Endoscopy;  Laterality: N/A;  9:30 AM  . CORONARY ARTERY BYPASS GRAFT N/A 10/18/2015   Procedure: CORONARY ARTERY BYPASS GRAFTING (CABG) x  five, using left internal mammary artery and right leg greater saphenous vein harvested endoscopically;  Surgeon: [name], MD;  Location: [facility];  Service: Open Heart Surgery;  Laterality: N/A;  . ENDARTERECTOMY  FEMORAL Right 08/09/2016   Procedure: ENDARTERECTOMY FEMORAL WITH VEIN PATCH ANGIOPLASTY;  Surgeon: Serafina Mitchell, MD;  Location: [facility];  Service: Vascular;  Laterality: Right;  . FEMORAL-FEMORAL BYPASS GRAFT Bilateral 08/09/2016   Procedure: REVISION BYPASS GRAFT RIGHT FEMORAL-LEFT FEMORAL ARTERY;  Surgeon: Serafina Mitchell, MD;  Location: [facility];  Service: Vascular;  Laterality: Bilateral;  . FEMORAL-POPLITEAL BYPASS GRAFT    . PERIPHERAL VASCULAR CATHETERIZATION N/A 05/08/2016   Procedure: Lower Extremity Angiography;  Surgeon: Lorretta Harp, MD;  Location: [facility];  Service: Cardiovascular;  Laterality: N/A;  . PERIPHERAL VASCULAR INTERVENTION Right 09/19/2016   Procedure: Peripheral Vascular Intervention;  Surgeon: Serafina Mitchell, MD;  Location: [facility];  Service: Cardiovascular;  Laterality: Right;  ext iliac stent  . TEE WITHOUT CARDIOVERSION N/A 10/18/2015   Procedure: TRANSESOPHAGEAL ECHOCARDIOGRAM (TEE);  Surgeon: [name], MD;  Location: [facility];  Service: Open Heart Surgery;  Laterality: N/A;    Current Outpatient Medications  Medication Sig Dispense Refill  . albuterol (PROVENTIL HFA;VENTOLIN HFA) 108 (90 Base) MCG/ACT inhaler Inhale 2 puffs into the lungs every 6 (six) hours as needed for wheezing or shortness of breath. 1 Inhaler 2  . aspirin 81 MG tablet Take 81 mg by mouth daily.    . Cholecalciferol (VITAMIN D) 2000 UNITS tablet Take 2,000 Units by mouth daily.    . cilostazol (PLETAL) 100 MG tablet Take 100 mg by mouth 2 (two) times daily.    . ferrous sulfate 325 (65 FE) MG tablet Take 325 mg by mouth daily.     [name] gabapentin (NEURONTIN) 300 MG capsule Take 300-600 mg by mouth 2 (two) times daily. 300mg  in the morning and 600mg  at bedtime    . guaiFENesin (MUCINEX) 600 MG 12 hr tablet Take 1 tablet (600 mg total) by mouth 2 (two) times daily. 60 tablet 2  . insulin glargine (LANTUS) 100 UNIT/ML injection Inject 0.2 mLs (20 Units total) into the  skin at bedtime. 10 mL 11  . metoprolol tartrate (LOPRESSOR) 25 MG tablet Take 25 mg by mouth 2 (two) times daily.    . pantoprazole (PROTONIX) 40 MG tablet Take 1 tablet (40 mg total) by mouth daily. 5 tablet 0  . simvastatin (ZOCOR) 80 MG tablet Take 40 mg by mouth at bedtime.     . vitamin B-12 (CYANOCOBALAMIN) 1000 MCG tablet Take 1,000 mcg by mouth daily.     No current facility-administered medications for this visit.     Allergies as of 10/19/2017 - Review Complete 10/19/2017  Allergen Reaction Noted  . No known allergies  08/08/2016    Family History  Problem Relation Age of Onset  . Heart attack Mother   . Stroke Mother   . Colon cancer Neg Hx     Social History   Socioeconomic History  . Marital status: Married    Spouse name: None  . Number of children: None  . Years of education: None  . Highest education level: None  Social Needs  . Financial resource strain: None  . Food insecurity -  worry: None  . Food insecurity - inability: None  . Transportation needs - medical: None  . Transportation needs - non-medical: None  Occupational History  . None  Tobacco Use  . Smoking status: Former Smoker    Packs/day: 2.00    Years: 30.00    Pack years: 60.00    Types: Cigarettes    Last attempt to quit: 08/14/2000    Years since quitting: 17.1  . Smokeless tobacco: Never Used  Substance and Sexual Activity  . Alcohol use: No  . Drug use: No  . Sexual activity: None  Other Topics Concern  . None  Social History Narrative  . None    Review of Systems: General: Negative for anorexia, weight loss, fever, chills, fatigue, weakness. ENT: Negative for hoarseness, difficulty swallowing , nasal congestion. CV: Negative for chest pain, angina, palpitations, dyspnea on exertion, peripheral edema.  Respiratory: Negative for dyspnea at rest, dyspnea on exertion, cough, sputum, wheezing.  GI: See history of present illness. Endo: Negative for unusual weight change.    Heme: Negative for bruising or bleeding. Allergy: Negative for rash or hives.   Physical Exam: BP 136/72   Pulse 74   Temp (!) 97 F (36.1 C) (Oral)   Ht 5\' 6"  (1.676 m)   Wt 153 lb 6.4 oz (69.6 kg)   BMI 24.76 kg/m  General:   Alert and oriented. Pleasant and cooperative. Well-nourished and well-developed.  Eyes:  Without icterus, sclera clear and conjunctiva pink.  Ears:  Normal auditory acuity. Cardiovascular:  S1, S2 present without murmurs appreciated. Extremities without clubbing or edema. Respiratory:  Clear to auscultation bilaterally. No wheezes, rales, or rhonchi. No distress.  Gastrointestinal:  +BS, soft, non-tender and non-distended. No HSM noted. No guarding or rebound. No masses appreciated.  Rectal:  Deferred  Musculoskalatal:  Symmetrical without gross deformities. Skin:  Intact without significant lesions or rashes. Neurologic:  Alert and oriented x4;  grossly normal neurologically. Psych:  Alert and cooperative. Normal mood and affect. Heme/Lymph/Immune: No excessive bruising noted.    10/19/2017 11:39 AM   Disclaimer: This note was dictated with voice recognition software. Similar sounding words can inadvertently be transcribed and may not be corrected upon review.

## 2017-10-22 ENCOUNTER — Encounter: Payer: Self-pay | Admitting: Nurse Practitioner

## 2017-10-22 ENCOUNTER — Telehealth: Payer: Self-pay | Admitting: *Deleted

## 2017-10-22 NOTE — Progress Notes (Signed)
CC'D TO PCP °

## 2017-10-22 NOTE — Telephone Encounter (Signed)
[facility] scheduled for 10/26/17 at 9:00am, arrival time 8:45am, npo 3 hrs prior.  LMOVM

## 2017-10-24 ENCOUNTER — Encounter (HOSPITAL_COMMUNITY)
Admission: RE | Disposition: A | Discharge status: Home / Self Care | Payer: Self-pay | Source: Ambulatory Visit | Attending: Internal Medicine

## 2017-10-24 ENCOUNTER — Encounter (HOSPITAL_COMMUNITY): Admission: RE | Payer: Self-pay | Source: Ambulatory Visit

## 2017-10-24 ENCOUNTER — Ambulatory Visit (HOSPITAL_COMMUNITY): Admission: RE | Admit: 2017-10-24 | Payer: Medicare Other | Source: Ambulatory Visit | Admitting: Internal Medicine

## 2017-10-24 ENCOUNTER — Encounter (HOSPITAL_COMMUNITY): Payer: Self-pay | Admitting: *Deleted

## 2017-10-24 ENCOUNTER — Ambulatory Visit (HOSPITAL_COMMUNITY)
Admission: RE | Admit: 2017-10-24 | Discharge: 2017-10-24 | Disposition: A | Discharge status: Home / Self Care | Payer: Medicare Other | Source: Ambulatory Visit | Attending: Internal Medicine | Admitting: Internal Medicine

## 2017-10-24 ENCOUNTER — Other Ambulatory Visit: Payer: Self-pay

## 2017-10-24 DIAGNOSIS — Z87891 Personal history of nicotine dependence: Secondary | ICD-10-CM | POA: Insufficient documentation

## 2017-10-24 DIAGNOSIS — K219 Gastro-esophageal reflux disease without esophagitis: Secondary | ICD-10-CM | POA: Diagnosis not present

## 2017-10-24 DIAGNOSIS — I2510 Atherosclerotic heart disease of native coronary artery without angina pectoris: Secondary | ICD-10-CM | POA: Insufficient documentation

## 2017-10-24 DIAGNOSIS — Z951 Presence of aortocoronary bypass graft: Secondary | ICD-10-CM | POA: Diagnosis not present

## 2017-10-24 DIAGNOSIS — K5732 Diverticulitis of large intestine without perforation or abscess without bleeding: Secondary | ICD-10-CM | POA: Diagnosis not present

## 2017-10-24 DIAGNOSIS — I10 Essential (primary) hypertension: Secondary | ICD-10-CM | POA: Insufficient documentation

## 2017-10-24 DIAGNOSIS — Z794 Long term (current) use of insulin: Secondary | ICD-10-CM | POA: Insufficient documentation

## 2017-10-24 DIAGNOSIS — Z79899 Other long term (current) drug therapy: Secondary | ICD-10-CM | POA: Insufficient documentation

## 2017-10-24 DIAGNOSIS — Z7982 Long term (current) use of aspirin: Secondary | ICD-10-CM | POA: Diagnosis not present

## 2017-10-24 DIAGNOSIS — R933 Abnormal findings on diagnostic imaging of other parts of digestive tract: Secondary | ICD-10-CM | POA: Diagnosis not present

## 2017-10-24 DIAGNOSIS — R12 Heartburn: Secondary | ICD-10-CM

## 2017-10-24 DIAGNOSIS — Z09 Encounter for follow-up examination after completed treatment for conditions other than malignant neoplasm: Secondary | ICD-10-CM | POA: Diagnosis not present

## 2017-10-24 DIAGNOSIS — E7800 Pure hypercholesterolemia, unspecified: Secondary | ICD-10-CM | POA: Insufficient documentation

## 2017-10-24 DIAGNOSIS — Z955 Presence of coronary angioplasty implant and graft: Secondary | ICD-10-CM | POA: Insufficient documentation

## 2017-10-24 DIAGNOSIS — Z538 Procedure and treatment not carried out for other reasons: Secondary | ICD-10-CM

## 2017-10-24 DIAGNOSIS — E1140 Type 2 diabetes mellitus with diabetic neuropathy, unspecified: Secondary | ICD-10-CM | POA: Diagnosis not present

## 2017-10-24 DIAGNOSIS — K449 Diaphragmatic hernia without obstruction or gangrene: Secondary | ICD-10-CM | POA: Diagnosis not present

## 2017-10-24 DIAGNOSIS — Z95820 Peripheral vascular angioplasty status with implants and grafts: Secondary | ICD-10-CM | POA: Diagnosis not present

## 2017-10-24 DIAGNOSIS — E1151 Type 2 diabetes mellitus with diabetic peripheral angiopathy without gangrene: Secondary | ICD-10-CM | POA: Diagnosis not present

## 2017-10-24 HISTORY — PX: PROCTOSCOPY: SHX2266

## 2017-10-24 HISTORY — PX: ESOPHAGOGASTRODUODENOSCOPY: SHX5428

## 2017-10-24 LAB — GLUCOSE, CAPILLARY: Glucose-Capillary: 78 mg/dL (ref 65–99)

## 2017-10-24 SURGERY — EGD (ESOPHAGOGASTRODUODENOSCOPY)
Anesthesia: Moderate Sedation

## 2017-10-24 SURGERY — COLONOSCOPY
Anesthesia: Moderate Sedation

## 2017-10-24 MED ORDER — ONDANSETRON HCL 4 MG/2ML IJ SOLN
INTRAMUSCULAR | Status: AC
  Filled 2017-10-24: qty 2

## 2017-10-24 MED ORDER — LIDOCAINE VISCOUS 2 % MT SOLN
OROMUCOSAL | Status: AC
  Filled 2017-10-24: qty 15

## 2017-10-24 MED ORDER — MEPERIDINE HCL 100 MG/ML IJ SOLN
INTRAMUSCULAR | Status: DC | PRN
  Administered 2017-10-24: 25 mg via INTRAVENOUS
  Administered 2017-10-24: 50 mg via INTRAVENOUS

## 2017-10-24 MED ORDER — MEPERIDINE HCL 100 MG/ML IJ SOLN
INTRAMUSCULAR | Status: AC
  Filled 2017-10-24: qty 2

## 2017-10-24 MED ORDER — LIDOCAINE VISCOUS 2 % MT SOLN
OROMUCOSAL | Status: DC | PRN
  Administered 2017-10-24: 1 via OROMUCOSAL

## 2017-10-24 MED ORDER — ONDANSETRON HCL 4 MG/2ML IJ SOLN
INTRAMUSCULAR | Status: DC | PRN
  Administered 2017-10-24: 4 mg via INTRAVENOUS

## 2017-10-24 MED ORDER — MIDAZOLAM HCL 5 MG/5ML IJ SOLN
INTRAMUSCULAR | Status: AC
  Filled 2017-10-24: qty 10

## 2017-10-24 MED ORDER — SODIUM CHLORIDE 0.9 % IV SOLN
INTRAVENOUS | Status: DC
  Administered 2017-10-24: 10:00:00 via INTRAVENOUS

## 2017-10-24 MED ORDER — MIDAZOLAM HCL 5 MG/5ML IJ SOLN
INTRAMUSCULAR | Status: DC | PRN
  Administered 2017-10-24 (×2): 1 mg via INTRAVENOUS
  Administered 2017-10-24: 2 mg via INTRAVENOUS

## 2017-10-24 NOTE — Op Note (Signed)
[facility] Patient Name: [name] Procedure Date: 10/24/2017 10:19 AM MRN: [id number] Date of Birth: 10/18/1945 Attending MD: Norvel Richards , MD CSN: [id number] Age: 72 Admit Type: Outpatient Procedure:                Colonoscopy Indications:              Abnormal CT of the GI tract Providers:                Norvel Richards, MD, Janeece Riggers, RN, Randa Spike, Technician Referring MD:              Medicines:                Midazolam 3 mg IV, Meperidine 75 mg IV, Ondansetron                            4 mg IV Complications:            No immediate complications. Estimated Blood Loss:     Estimated blood loss: none. Procedure:                Pre-Anesthesia Assessment:                           - Prior to the procedure, a History and Physical                            was performed, and patient medications and                            allergies were reviewed. The patient's tolerance of                            previous anesthesia was also reviewed. The risks                            and benefits of the procedure and the sedation                            options and risks were discussed with the patient.                            All questions were answered, and informed consent                            was obtained. Prior Anticoagulants: The patient has                            taken no previous anticoagulant or antiplatelet                            agents. ASA Grade Assessment: II - A patient with  mild systemic disease. After reviewing the risks                            and benefits, the patient was deemed in                            satisfactory condition to undergo the procedure.                           After obtaining informed consent, the colonoscope                            was passed under direct vision. Throughout the                            procedure, the patient's blood  pressure, pulse, and                            oxygen saturations were monitored continuously. The                            EC-[id number] ([id number]) scope was introduced through                            the anus with the intention of advancing to the                            ileum. The scope was advanced to the rectum before                            the procedure was aborted. Medications were given.                            The colonoscopy was performed without difficulty.                            The patient tolerated the procedure well. The                            quality of the bowel preparation was inadequate.                            The rectum was photographed. Scope In: 10:22:21 AM Scope Out: 10:23:07 AM Total Procedure Duration: 0 hours 0 minutes 46 seconds  Findings:      The perianal and digital rectal examinations were normal. examination of       the rectum revealed poor prep granular viscous formed and semi-formed       stool elements of occluding the lumen. Unable to carry out examination       today. Impression:               - Preparation of the colon was inadequate.  Procedure aborted                           - Moderate Sedation:      Moderate (conscious) sedation was personally administered by an       anesthesia professional. The following parameters were monitored: oxygen       saturation, heart rate, blood pressure, respiratory rate, EKG, adequacy       of pulmonary ventilation, and response to care. Total physician       intraservice time was 19 minutes. Recommendation:           - Patient has a contact number available for                            emergencies. The signs and symptoms of potential                            delayed complications were discussed with the                            patient. Return to normal activities tomorrow.                            Written discharge instructions were provided to the                             patient.                           - Advance diet as tolerated.                           - Continue present medications.                           - Repeat colonoscopy in 1 month because the bowel                            preparation was poor.                           - Return to GI clinic in 1 month. Procedure Code(s):        --- Professional ---                           [phone], 56, Colonoscopy, flexible; diagnostic,                            including collection of specimen(s) by brushing or                            washing, when performed (separate procedure) Diagnosis Code(s):        --- Professional ---                           R93.3, Abnormal findings on diagnostic imaging of  other parts of digestive tract CPT copyright 2016 American [employer] Association. All rights reserved. The codes documented in this report are preliminary and upon coder review may  be revised to meet current compliance requirements. Cristopher Estimable. Lance Huaracha, MD Norvel Richards, MD 10/24/2017 10:27:57 AM This report has been signed electronically. Number of Addenda: 0

## 2017-10-24 NOTE — Interval H&P Note (Signed)
History and Physical Interval Note:  10/24/2017 10:01 AM  [name]  has presented today for surgery, with the diagnosis of GERD, diverticulitis  The various methods of treatment have been discussed with the patient and family. After consideration of risks, benefits and other options for treatment, the patient has consented to  Procedure(s) with comments: COLONOSCOPY (N/A) - 10:30am ESOPHAGOGASTRODUODENOSCOPY (EGD) (N/A) as a surgical intervention .  The patient's history has been reviewed, patient examined, no change in status, stable for surgery.  I have reviewed the patient's chart and labs.  Questions were answered to the patient's satisfaction.     [name]  No dysphagia.  Diagnostic EGD and colonoscopy per plan.  The risks, benefits, limitations, imponderables and alternatives regarding both EGD and colonoscopy have been reviewed with the patient. Questions have been answered. All parties agreeable.

## 2017-10-24 NOTE — Telephone Encounter (Signed)
Called spoke with pt and is aware of appt details

## 2017-10-24 NOTE — Op Note (Signed)
[facility] Patient Name: [name] Procedure Date: 10/24/2017 9:50 AM MRN: [id number] Date of Birth: May 05, 1946 Attending MD: Norvel Richards , MD CSN: [id number] Age: 72 Admit Type: Outpatient Procedure:                Upper GI endoscopy Indications:              Heartburn Providers:                Norvel Richards, MD, Janeece Riggers, RN, Randa Spike, Technician Referring MD:              Medicines:                Midazolam 3 mg IV, Meperidine 75 mg IV Complications:            No immediate complications. Estimated Blood Loss:     Estimated blood loss: none. Procedure:                Pre-Anesthesia Assessment:                           - Prior to the procedure, a History and Physical                            was performed, and patient medications and                            allergies were reviewed. The patient's tolerance of                            previous anesthesia was also reviewed. The risks                            and benefits of the procedure and the sedation                            options and risks were discussed with the patient.                            All questions were answered, and informed consent                            was obtained. Prior Anticoagulants: The patient has                            taken no previous anticoagulant or antiplatelet                            agents. ASA Grade Assessment: II - A patient with                            mild systemic disease. After reviewing the risks  and benefits, the patient was deemed in                            satisfactory condition to undergo the procedure.                           After obtaining informed consent, the endoscope was                            passed under direct vision. Throughout the                            procedure, the patient's blood pressure, pulse, and                            oxygen saturations  were monitored continuously. The                            [id number] ([id number]) scope was introduced through the                            mouth, and advanced to the second part of duodenum.                            The upper GI endoscopy was accomplished without                            difficulty. The patient tolerated the procedure                            well. Scope In: 10:13:24 AM Scope Out: 10:17:26 AM Total Procedure Duration: 0 hours 4 minutes 2 seconds  Findings:      The examined esophagus was normal.      A small hiatal hernia was present.      The exam was otherwise without abnormality.      The duodenal bulb and second portion of the duodenum were normal. Impression:               - Normal esophagus.                           - Small hiatal hernia.                           - The examination was otherwise normal.                           - No specimens collected. Moderate Sedation:      Moderate (conscious) sedation was administered by the endoscopy nurse       and supervised by the endoscopist. The following parameters were       monitored: oxygen saturation, heart rate, blood pressure, respiratory       rate, EKG, adequacy of pulmonary ventilation, and response to care.       Total physician intraservice time was 15 minutes. Recommendation:           -  Patient has a contact number available for                            emergencies. The signs and symptoms of potential                            delayed complications were discussed with the                            patient. Return to normal activities tomorrow.                            Written discharge instructions were provided to the                            patient.                           - Resume previous diet.                           - Continue present medications.                           - Await pathology results.                           - Return to GI clinic (date not yet  determined). Procedure Code(s):        --- Professional ---                           [phone], Esophagogastroduodenoscopy, flexible,                            transoral; diagnostic, including collection of                            specimen(s) by brushing or washing, when performed                            (separate procedure)                           99152, Moderate sedation services provided by the                            same physician or other qualified health care                            professional performing the diagnostic or                            therapeutic service that the sedation supports,                            requiring the presence of an independent trained  observer to assist in the monitoring of the                            patient's level of consciousness and physiological                            status; initial 15 minutes of intraservice time,                            patient age 39 years or older Diagnosis Code(s):        --- Professional ---                           K44.9, Diaphragmatic hernia without obstruction or                            gangrene                           R12, Heartburn CPT copyright 2016 American Medical Association. All rights reserved. The codes documented in this report are preliminary and upon coder review may  be revised to meet current compliance requirements. Cristopher Estimable. Okie Bogacz, MD Norvel Richards, MD 10/24/2017 10:21:09 AM This report has been signed electronically. Number of Addenda: 0

## 2017-10-24 NOTE — Discharge Instructions (Signed)
°Colonoscopy °Discharge Instructions ° °Read the instructions outlined below and refer to this sheet in the next few weeks. These discharge instructions provide you with general information on caring for yourself after you leave the hospital. Your doctor may also give you specific instructions. While your treatment has been planned according to the most current medical practices available, unavoidable complications occasionally occur. If you have any problems or questions after discharge, call Dr. Rourk at [phone]. °ACTIVITY °· You may resume your regular activity, but move at a slower pace for the next 24 hours.  °· Take frequent rest periods for the next 24 hours.  °· Walking will help get rid of the air and reduce the bloated feeling in your belly (abdomen).  °· No driving for 24 hours (because of the medicine (anesthesia) used during the test).   °· Do not sign any important legal documents or operate any machinery for 24 hours (because of the anesthesia used during the test).  °NUTRITION °· Drink plenty of fluids.  °· You may resume your normal diet as instructed by your doctor.  °· Begin with a light meal and progress to your normal diet. Heavy or fried foods are harder to digest and may make you feel sick to your stomach (nauseated).  °· Avoid alcoholic beverages for 24 hours or as instructed.  °MEDICATIONS °· You may resume your normal medications unless your doctor tells you otherwise.  °WHAT YOU CAN EXPECT TODAY °· Some feelings of bloating in the abdomen.  °· Passage of more gas than usual.  °· Spotting of blood in your stool or on the toilet paper.  °IF YOU HAD POLYPS REMOVED DURING THE COLONOSCOPY: °· No aspirin products for 7 days or as instructed.  °· No alcohol for 7 days or as instructed.  °· Eat a soft diet for the next 24 hours.  °FINDING OUT THE RESULTS OF YOUR TEST °Not all test results are available during your visit. If your test results are not back during the visit, make an appointment  with your caregiver to find out the results. Do not assume everything is normal if you have not heard from your caregiver or the medical facility. It is important for you to follow up on all of your test results.  °SEEK IMMEDIATE MEDICAL ATTENTION IF: °· You have more than a spotting of blood in your stool.  °· Your belly is swollen (abdominal distention).  °· You are nauseated or vomiting.  °· You have a temperature over 101.  °· You have abdominal pain or discomfort that is severe or gets worse throughout the day.  °EGD °Discharge instructions °Please read the instructions outlined below and refer to this sheet in the next few weeks. These discharge instructions provide you with general information on caring for yourself after you leave the hospital. Your doctor may also give you specific instructions. While your treatment has been planned according to the most current medical practices available, unavoidable complications occasionally occur. If you have any problems or questions after discharge, please call your doctor. °ACTIVITY °· You may resume your regular activity but move at a slower pace for the next 24 hours.  °· Take frequent rest periods for the next 24 hours.  °· Walking will help expel (get rid of) the air and reduce the bloated feeling in your abdomen.  °· No driving for 24 hours (because of the anesthesia (medicine) used during the test).  °· You may shower.  °· Do not sign any important   legal documents or operate any machinery for 24 hours (because of the anesthesia used during the test).  NUTRITION  Drink plenty of fluids.   You may resume your normal diet.   Begin with a light meal and progress to your normal diet.   Avoid alcoholic beverages for 24 hours or as instructed by your caregiver.  MEDICATIONS  You may resume your normal medications unless your caregiver tells you otherwise.  WHAT YOU CAN EXPECT TODAY  You may experience abdominal discomfort such as a feeling of fullness  or gas pains.  FOLLOW-UP  Your doctor will discuss the results of your test with you.  SEEK IMMEDIATE MEDICAL ATTENTION IF ANY OF THE FOLLOWING OCCUR:  Excessive nausea (feeling sick to your stomach) and/or vomiting.   Severe abdominal pain and distention (swelling).   Trouble swallowing.   Temperature over 101 F (37.8 C).   Rectal bleeding or vomiting of blood.    GERD information provided  Continue Protonix 40 mg daily  Your colon was not cleaned out for colonoscopy today. We will schedule an office visit in one month      Gastroesophageal Reflux Disease, Adult Normally, food travels down the esophagus and stays in the stomach to be digested. If a person has gastroesophageal reflux disease (GERD), food and stomach acid move back up into the esophagus. When this happens, the esophagus becomes sore and swollen (inflamed). Over time, GERD can make small holes (ulcers) in the lining of the esophagus. Follow these instructions at home: Diet  Follow a diet as told by your doctor. You may need to avoid foods and drinks such as: ? Coffee and tea (with or without caffeine). ? Drinks that contain alcohol. ? Energy drinks and sports drinks. ? Carbonated drinks or sodas. ? Chocolate and cocoa. ? Peppermint and mint flavorings. ? Garlic and onions. ? Horseradish. ? Spicy and acidic foods, such as peppers, chili powder, curry powder, vinegar, hot sauces, and BBQ sauce. ? Citrus fruit juices and citrus fruits, such as oranges, lemons, and limes. ? Tomato-based foods, such as red sauce, chili, salsa, and pizza with red sauce. ? Fried and fatty foods, such as donuts, french fries, potato chips, and high-fat dressings. ? High-fat meats, such as hot dogs, rib eye steak, sausage, ham, and bacon. ? High-fat dairy items, such as whole milk, butter, and cream cheese.  Eat small meals often. Avoid eating large meals.  Avoid drinking large amounts of liquid with your meals.  Avoid  eating meals during the 2-3 hours before bedtime.  Avoid lying down right after you eat.  Do not exercise right after you eat. General instructions  Pay attention to any changes in your symptoms.  Take over-the-counter and prescription medicines only as told by your doctor. Do not take aspirin, ibuprofen, or other NSAIDs unless your doctor says it is okay.  Do not use any tobacco products, including cigarettes, chewing tobacco, and e-cigarettes. If you need help quitting, ask your doctor.  Wear loose clothes. Do not wear anything tight around your waist.  Raise (elevate) the head of your bed about 6 inches (15 cm).  Try to lower your stress. If you need help doing this, ask your doctor.  If you are overweight, lose an amount of weight that is healthy for you. Ask your doctor about a safe weight loss goal.  Keep all follow-up visits as told by your doctor. This is important. Contact a doctor if:  You have new symptoms.  You lose weight  and you do not know why it is happening.  You have trouble swallowing, or it hurts to swallow.  You have wheezing or a cough that keeps happening.  Your symptoms do not get better with treatment.  You have a hoarse voice. Get help right away if:  You have pain in your arms, neck, jaw, teeth, or back.  You feel sweaty, dizzy, or light-headed.  You have chest pain or shortness of breath.  You throw up (vomit) and your throw up looks like blood or coffee grounds.  You pass out (faint).  Your poop (stool) is bloody or black.  You cannot swallow, drink, or eat. This information is not intended to replace advice given to you by your health care provider. Make sure you discuss any questions you have with your health care provider. Document Released: 01/17/2008 Document Revised: 01/06/2016 Document Reviewed: 11/25/2014 Elsevier Interactive Patient Education  [employer].

## 2017-10-24 NOTE — Telephone Encounter (Signed)
LMOVM

## 2017-10-26 ENCOUNTER — Ambulatory Visit (HOSPITAL_COMMUNITY)
Admission: RE | Admit: 2017-10-26 | Discharge: 2017-10-26 | Disposition: A | Discharge status: Home / Self Care | Payer: Medicare Other | Source: Ambulatory Visit | Attending: Nurse Practitioner | Admitting: Nurse Practitioner

## 2017-10-26 DIAGNOSIS — K219 Gastro-esophageal reflux disease without esophagitis: Secondary | ICD-10-CM

## 2017-10-26 DIAGNOSIS — R1319 Other dysphagia: Secondary | ICD-10-CM | POA: Diagnosis not present

## 2017-10-26 DIAGNOSIS — K5720 Diverticulitis of large intestine with perforation and abscess without bleeding: Secondary | ICD-10-CM

## 2017-10-30 ENCOUNTER — Encounter (HOSPITAL_COMMUNITY): Payer: Self-pay | Admitting: Internal Medicine

## 2017-11-01 ENCOUNTER — Other Ambulatory Visit: Payer: Self-pay | Admitting: *Deleted

## 2017-11-01 DIAGNOSIS — R918 Other nonspecific abnormal finding of lung field: Secondary | ICD-10-CM

## 2017-11-01 DIAGNOSIS — R911 Solitary pulmonary nodule: Secondary | ICD-10-CM

## 2017-11-08 NOTE — Progress Notes (Signed)
Pt notified of results

## 2017-11-08 NOTE — Progress Notes (Signed)
PT is aware.

## 2017-11-23 ENCOUNTER — Ambulatory Visit: Payer: Medicare Other | Admitting: Gastroenterology

## 2017-11-23 ENCOUNTER — Encounter: Payer: Self-pay | Admitting: Gastroenterology

## 2017-11-23 VITALS — BP 151/70 | HR 57 | Temp 96.8°F | Ht 67.0 in | Wt 151.6 lb

## 2017-11-23 DIAGNOSIS — K5720 Diverticulitis of large intestine with perforation and abscess without bleeding: Secondary | ICD-10-CM

## 2017-11-23 DIAGNOSIS — R933 Abnormal findings on diagnostic imaging of other parts of digestive tract: Secondary | ICD-10-CM | POA: Diagnosis not present

## 2017-11-23 DIAGNOSIS — K5900 Constipation, unspecified: Secondary | ICD-10-CM

## 2017-11-23 MED ORDER — LINACLOTIDE 145 MCG PO CAPS: 145.0000 ug | ORAL_CAPSULE | Freq: Every day | ORAL | 5 refills | Status: DC

## 2017-11-23 NOTE — Assessment & Plan Note (Signed)
72 y/o male with hospitalization in 08/2017 for diverticulitis and possible abscess but not real clear on CT imaging. There was some opinion by surgery and reread by radiologist that suspected abnormality seen was a large diverticulum containing stool/gas vs abscess with communication with the colon. Felt to be too risk to attempt drainage. Treated conservatively with antibiotics. Of note, patient did not present with abd pain at that time, he presented with fever.   Recent colonoscopy was aborted due to poor prep. He presents to reschedule his colonoscopy. On exam, he has fullness in LLQ, tender to palpation. Before TCS, we will plan to get another CT A/P with contrast.

## 2017-11-23 NOTE — Assessment & Plan Note (Signed)
Will address constipation in light of recent poor bowel prep. Start Linzess 141mcg daily. Samples to be provided, as well as RX.

## 2017-11-23 NOTE — Progress Notes (Signed)
Primary Care Physician:  [name], MD  Primary Gastroenterologist:  Garfield Cornea, MD   Chief Complaint  Patient presents with  . Colonoscopy    had poor prep    HPI:  [name] is a 72 y.o. male here to schedule repeat colonoscopy. He had attempted colonoscopy last month but prep was inadequate and procedure had to be aborted..   Patient's history significant for abnormal colon on CT in 08/2017. Treated for diverticulitis and initially there was concern for possible abscess but with repeat CT "reads" there was concern it could me a large diverticula vs an abscess communicating with the colon. IR felt it was too risky to drain especially since it could be colon instead of an abscess. Patient was treated with antibiotic therapy and plans for follow up colonoscopy to exclude underlying malignancy.   Patient states with recent TCS prep he did every thing appropriate from the diet to the bowel prep. He had Nulytely split prep. He at baseline has BM about every 2 days, stools sometimes hard. No melena, brbpr. No abd pain. Appetite is good. Only single episode of heartburn since last OV. Of note, he had EGD last month due to concerns for GERD/aspiration PNA.  He had a small hiatal hernia but otherwise unremarkable.  Barium esophagram showed chronic compression consider incidental given no obstruction or stasis but otherwise normal esophagus.    Current Outpatient Medications  Medication Sig Dispense Refill  . albuterol (PROVENTIL HFA;VENTOLIN HFA) 108 (90 Base) MCG/ACT inhaler Inhale 2 puffs into the lungs every 6 (six) hours as needed for wheezing or shortness of breath. 1 Inhaler 2  . aspirin 81 MG tablet Take 81 mg by mouth daily.    . Cholecalciferol (VITAMIN D) 2000 UNITS tablet Take 2,000 Units by mouth daily.    . cilostazol (PLETAL) 100 MG tablet Take 100 mg by mouth 2 (two) times daily.    . ferrous sulfate 325 (65 FE) MG tablet Take 325 mg by mouth daily.     [name] gabapentin  (NEURONTIN) 300 MG capsule Take 300-600 mg by mouth 2 (two) times daily. 300mg  in the morning and 600mg  at bedtime    . guaiFENesin (MUCINEX) 600 MG 12 hr tablet Take 1 tablet (600 mg total) by mouth 2 (two) times daily. 60 tablet 2  . insulin glargine (LANTUS) 100 UNIT/ML injection Inject 0.2 mLs (20 Units total) into the skin at bedtime. 10 mL 11  . metoprolol tartrate (LOPRESSOR) 25 MG tablet Take 25 mg by mouth 2 (two) times daily.    . pantoprazole (PROTONIX) 40 MG tablet Take 1 tablet (40 mg total) by mouth daily. 5 tablet 0  . simvastatin (ZOCOR) 80 MG tablet Take 40 mg by mouth at bedtime.     . vitamin B-12 (CYANOCOBALAMIN) 1000 MCG tablet Take 1,000 mcg by mouth daily.     No current facility-administered medications for this visit.     Allergies as of 11/23/2017 - Review Complete 11/23/2017  Allergen Reaction Noted  . No known allergies  08/08/2016    Past Medical History:  Diagnosis Date  . CAD S/P percutaneous coronary angioplasty 1998   PCI TO CX  . Diabetes mellitus without complication ([place])   . Diverticulitis 08/16/2017   hospitalized with diverticulitis/sepsis  . GERD (gastroesophageal reflux disease)   . Headache   . 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 Hypercholesteremia   . Hypertension   . Neuropathy   . Peripheral vascular disease (  [place])    s/p R-L FEM-FEM BYPASS  . Pneumonia   . Tachycardia    after CABG, pt on medicine for this    Past Surgical History:  Procedure Laterality Date  . ABDOMINAL AORTOGRAM Bilateral 09/19/2016   Procedure: iliac;  Surgeon: Serafina Mitchell, MD;  Location: [facility];  Service: Cardiovascular;  Laterality: Bilateral;  . CARDIAC CATHETERIZATION  2003   with stent  . CARDIAC CATHETERIZATION N/A 10/11/2015   Procedure: Left Heart Cath and Coronary Angiography;  Surgeon: Peter M Martinique, MD;  Location: [facility];  Service: Cardiovascular;  Laterality: N/A;  . COLONOSCOPY N/A 09/22/2013   Procedure: COLONOSCOPY;   Surgeon: Daneil Dolin, MD;  [facility];  Service: Endoscopy;  Laterality: N/A;  9:30 AM  . CORONARY ARTERY BYPASS GRAFT N/A 10/18/2015   Procedure: CORONARY ARTERY BYPASS GRAFTING (CABG) x  five, using left internal mammary artery and right leg greater saphenous vein harvested endoscopically;  Surgeon: [name], MD;  Location: [facility];  Service: Open Heart Surgery;  Laterality: N/A;  . ENDARTERECTOMY FEMORAL Right 08/09/2016   Procedure: ENDARTERECTOMY FEMORAL WITH VEIN PATCH ANGIOPLASTY;  Surgeon: Serafina Mitchell, MD;  Location: [facility];  Service: Vascular;  Laterality: Right;  . ESOPHAGOGASTRODUODENOSCOPY N/A 10/24/2017   Dr. Gala Romney: hiatal hernia  . FEMORAL-FEMORAL BYPASS GRAFT Bilateral 08/09/2016   Procedure: REVISION BYPASS GRAFT RIGHT FEMORAL-LEFT FEMORAL ARTERY;  Surgeon: Serafina Mitchell, MD;  Location: [facility];  Service: Vascular;  Laterality: Bilateral;  . FEMORAL-POPLITEAL BYPASS GRAFT    . PERIPHERAL VASCULAR CATHETERIZATION N/A 05/08/2016   Procedure: Lower Extremity Angiography;  Surgeon: Lorretta Harp, MD;  Location: [facility];  Service: Cardiovascular;  Laterality: N/A;  . PERIPHERAL VASCULAR INTERVENTION Right 09/19/2016   Procedure: Peripheral Vascular Intervention;  Surgeon: Serafina Mitchell, MD;  Location: [facility];  Service: Cardiovascular;  Laterality: Right;  ext iliac stent  . PROCTOSCOPY  10/24/2017   Procedure: PROCTOSCOPY;  Surgeon: Daneil Dolin, MD;  [facility];  Service: Endoscopy;;  . TEE WITHOUT CARDIOVERSION N/A 10/18/2015   Procedure: TRANSESOPHAGEAL ECHOCARDIOGRAM (TEE);  Surgeon: [name], MD;  Location: [facility];  Service: Open Heart Surgery;  Laterality: N/A;    Family History  Problem Relation Age of Onset  . Heart attack Mother   . Stroke Mother   . Colon cancer Neg Hx     Social History   Socioeconomic History  . Marital status: Married    Spouse name: Not on file  . Number of children:  Not on file  . Years of education: Not on file  . Highest education level: Not on file  Occupational History  . Not on file  Social Needs  . Financial resource strain: Not on file  . Food insecurity:    Worry: Not on file    Inability: Not on file  . Transportation needs:    Medical: Not on file    Non-medical: Not on file  Tobacco Use  . Smoking status: Former Smoker    Packs/day: 2.00    Years: 30.00    Pack years: 60.00    Types: Cigarettes    Last attempt to quit: 08/14/2000    Years since quitting: 17.2  . Smokeless tobacco: Never Used  Substance and Sexual Activity  . Alcohol use: No  . Drug use: No  . Sexual activity: Not on file  Lifestyle  . Physical activity:  Days per week: Not on file    Minutes per session: Not on file  . Stress: Not on file  Relationships  . Social connections:    Talks on phone: Not on file    Gets together: Not on file    Attends religious service: Not on file    Active member of club or organization: Not on file    Attends meetings of clubs or organizations: Not on file    Relationship status: Not on file  . Intimate partner violence:    Fear of current or ex partner: Not on file    Emotionally abused: Not on file    Physically abused: Not on file    Forced sexual activity: Not on file  Other Topics Concern  . Not on file  Social History Narrative  . Not on file      ROS:  General: Negative for anorexia, weight loss, fever, chills, fatigue, weakness. Eyes: Negative for vision changes.  ENT: Negative for hoarseness, difficulty swallowing , nasal congestion. CV: Negative for chest pain, angina, palpitations, dyspnea on exertion, peripheral edema.  Respiratory: Negative for dyspnea at rest, dyspnea on exertion, cough, sputum, wheezing.  GI: See history of present illness. GU:  Negative for dysuria, hematuria, urinary incontinence, urinary frequency, nocturnal urination.  MS: Negative for joint pain, low back pain.  Derm:  Negative for rash or itching.  Neuro: Negative for weakness, abnormal sensation, seizure, frequent headaches, memory loss, confusion.  Psych: Negative for anxiety, depression, suicidal ideation, hallucinations.  Endo: Negative for unusual weight change.  Heme: Negative for bruising or bleeding. Allergy: Negative for rash or hives.    Physical Examination:  BP (!) 151/70   Pulse (!) 57   Temp (!) 96.8 F (36 C) (Oral)   Ht 5\' 7"  (1.702 m)   Wt 151 lb 9.6 oz (68.8 kg)   BMI 23.74 kg/m    General: Well-nourished, well-developed in no acute distress.  Head: Normocephalic, atraumatic.   Eyes: Conjunctiva pink, no icterus. Mouth: Oropharyngeal mucosa moist and pink , no lesions erythema or exudate. Neck: Supple without thyromegaly, masses, or lymphadenopathy.  Lungs: Clear to auscultation bilaterally.  Heart: Regular rate and rhythm, no murmurs rubs or gallops.  Abdomen: Bowel sounds are normal, nondistended, no hepatosplenomegaly, no abdominal bruits. Rectus diastasis. Fullness palpation in LLQ without discrete mass, with some tenderness with deep palpation but, no rebound or guarding.   Rectal: not performed Extremities: No lower extremity edema. No clubbing or deformities.  Neuro: Alert and oriented x 4 , grossly normal neurologically.  Skin: Warm and dry, no rash or jaundice.   Psych: Alert and cooperative, normal mood and affect.  Labs: Lab Results  Component Value Date   CREATININE 1.24 10/03/2017   BUN 15 10/03/2017   NA 132 (L) 10/03/2017   K 3.6 10/03/2017   CL 104 10/03/2017   CO2 19 (L) 10/03/2017   Lab Results  Component Value Date   ALT 10 (L) 10/02/2017   AST 16 10/02/2017   ALKPHOS 60 10/02/2017   BILITOT 0.4 10/02/2017   Lab Results  Component Value Date   WBC 8.2 10/03/2017   HGB 11.1 (L) 10/03/2017   HCT 33.7 (L) 10/03/2017   MCV 87.8 10/03/2017   PLT 284 10/03/2017   Lab Results  Component Value Date   INR 1.08 10/01/2017   INR 1.02 08/16/2017    INR 0.97 08/01/2017     Imaging Studies: Dg Esophagus  Result Date: 10/26/2017 CLINICAL DATA:  Gastroesophageal reflux.  Recent negative endoscopy. EXAM: ESOPHOGRAM / BARIUM SWALLOW / BARIUM TABLET STUDY TECHNIQUE: Combined double contrast and single contrast examination performed using effervescent crystals, thick barium liquid, and thin barium liquid. The patient was observed with fluoroscopy swallowing a 13 mm barium sulphate tablet. FLUOROSCOPY TIME:  Fluoroscopy Time:  1 minutes 18 seconds Radiation Exposure Index (if provided by the fluoroscopic device): 8.7 mGy Number of Acquired Spot Images: 0 COMPARISON:  Chest CT 08/17/2017 FINDINGS: Lateral pharyngeal imaging shows some spillover but no aspiration or abnormal laryngeal penetration. There is good epiglottis turnover and airway protection. Prominent cricopharyngeal bar without obstruction, stasis, or diverticulum. The esophagus has a normal smooth mucosal surface. No narrowing or dysmotility. No convincing/significant hiatal hernia. No noted gastroesophageal reflux. A 13 mm barium tablet easily traversed the esophagus. IMPRESSION: 1. Normal appearance of the esophagus. 2. Prominent cricopharyngeal impression that is considered incidental given no obstruction or stasis. Electronically Signed   By: Monte Fantasia M.D.   On: 10/26/2017 09:06

## 2017-11-23 NOTE — Patient Instructions (Signed)
1. Start Linzess 150mcg daily for constipation. May skip a day for diarrhea. RX sent to pharmacy. Call if too expensive. I could not tell what is covered by your insurance.  2. CT A/P with contrast. Will plan for colonoscopy after I review your CT findings.

## 2017-11-26 ENCOUNTER — Telehealth: Payer: Self-pay

## 2017-11-26 ENCOUNTER — Ambulatory Visit (HOSPITAL_COMMUNITY)
Admission: RE | Admit: 2017-11-26 | Discharge: 2017-11-26 | Disposition: A | Discharge status: Home / Self Care | Payer: Medicare Other | Source: Ambulatory Visit | Attending: Gastroenterology | Admitting: Gastroenterology

## 2017-11-26 ENCOUNTER — Other Ambulatory Visit: Payer: Self-pay

## 2017-11-26 ENCOUNTER — Telehealth: Payer: Self-pay | Admitting: Internal Medicine

## 2017-11-26 DIAGNOSIS — R933 Abnormal findings on diagnostic imaging of other parts of digestive tract: Secondary | ICD-10-CM

## 2017-11-26 DIAGNOSIS — Z8719 Personal history of other diseases of the digestive system: Secondary | ICD-10-CM

## 2017-11-26 DIAGNOSIS — K5730 Diverticulosis of large intestine without perforation or abscess without bleeding: Secondary | ICD-10-CM | POA: Insufficient documentation

## 2017-11-26 DIAGNOSIS — I700 Atherosclerosis of aorta: Secondary | ICD-10-CM | POA: Insufficient documentation

## 2017-11-26 DIAGNOSIS — K5792 Diverticulitis of intestine, part unspecified, without perforation or abscess without bleeding: Secondary | ICD-10-CM

## 2017-11-26 LAB — POCT I-STAT CREATININE: Creatinine, Ser: 1.5 mg/dL — ABNORMAL HIGH (ref 0.61–1.24)

## 2017-11-26 MED ORDER — IOPAMIDOL (ISOVUE-300) INJECTION 61%
80.0000 mL | Freq: Once | INTRAVENOUS | Status: AC | PRN
  Administered 2017-11-26: 80 mL via INTRAVENOUS

## 2017-11-26 NOTE — Telephone Encounter (Signed)
CT with chronic findings. Further recommendations when Magda Paganini returns.

## 2017-11-26 NOTE — Telephone Encounter (Signed)
No PA needed for CT abd/pelvis per [employer] website. 

## 2017-11-26 NOTE — Telephone Encounter (Signed)
Received a call from [place] at [facility]. Pt's CT results are ready to review in Epic. Sending note to LSL and AB. AB LSL has left for the day.

## 2017-11-26 NOTE — Telephone Encounter (Signed)
Called pt, he is having CT done and will come by office to pick up Linzess samples.

## 2017-11-26 NOTE — Telephone Encounter (Addendum)
Called pt. Informed LSL wants CT today or tomorrow. He can go today. Hasn't ate or drank anything this morning. He is aware to stop by our office to pick up Linzess samples. [employer], he can go now for CT. Called and informed pt, he will get ready after his wife finishes getting ready. Advised to be NPO. He is aware he will be there approx 3 hours and will drink contrast. He didn't want to save a date for TCS at this time, he wants to wait to see what CT shows.  He passed out last night when he went to bathroom and hit his ribs. He is sore. States he will be going to [place] in [place] to be checked out.  Routing to LSL as FYI.

## 2017-11-26 NOTE — Progress Notes (Signed)
CC'D TO PCP °

## 2017-11-26 NOTE — Telephone Encounter (Signed)
Tried to call pt to set-up CT abd/pelvis w/contrast for today or tomorrow per LSL, no answer, LVOVM.  Linzess 181mcg samples and AVS placed at front desk.

## 2017-11-26 NOTE — Telephone Encounter (Signed)
Late entry: patient was returning a call from MB, but MB was at lunch. Please call him back

## 2017-11-27 ENCOUNTER — Encounter: Payer: Self-pay | Admitting: Thoracic Surgery (Cardiothoracic Vascular Surgery)

## 2017-11-27 ENCOUNTER — Ambulatory Visit: Payer: Medicare Other | Admitting: Thoracic Surgery (Cardiothoracic Vascular Surgery)

## 2017-11-27 ENCOUNTER — Ambulatory Visit
Admission: RE | Admit: 2017-11-27 | Discharge: 2017-11-27 | Disposition: A | Discharge status: Home / Self Care | Payer: Medicare Other | Source: Ambulatory Visit | Attending: Thoracic Surgery (Cardiothoracic Vascular Surgery) | Admitting: Thoracic Surgery (Cardiothoracic Vascular Surgery)

## 2017-11-27 ENCOUNTER — Telehealth: Payer: Self-pay | Admitting: Internal Medicine

## 2017-11-27 ENCOUNTER — Other Ambulatory Visit: Payer: Self-pay

## 2017-11-27 VITALS — BP 149/75 | HR 69 | Resp 16 | Ht 66.0 in | Wt 147.0 lb

## 2017-11-27 DIAGNOSIS — R911 Solitary pulmonary nodule: Secondary | ICD-10-CM | POA: Diagnosis not present

## 2017-11-27 DIAGNOSIS — R918 Other nonspecific abnormal finding of lung field: Secondary | ICD-10-CM | POA: Diagnosis not present

## 2017-11-27 DIAGNOSIS — R0789 Other chest pain: Secondary | ICD-10-CM | POA: Diagnosis not present

## 2017-11-27 DIAGNOSIS — S299XXA Unspecified injury of thorax, initial encounter: Secondary | ICD-10-CM | POA: Diagnosis not present

## 2017-11-27 DIAGNOSIS — Z951 Presence of aortocoronary bypass graft: Secondary | ICD-10-CM

## 2017-11-27 NOTE — Progress Notes (Signed)
[address]       [place] [address]             [phone]     HPI: [name] returns for an annual visit  [name] is a 72 year old man with a history of tobacco abuse, chronic kidney disease, and atherosclerotic cardiovascular disease.  I did coronary bypass grafting x5 in March 2017.  On his preoperative chest x-ray there was a cavitary lesion in the right upper lobe.  This was confirmed on CT of the chest.  A follow-up PET/CT showed interval resolution.  He met criteria for lung cancer screenings are recommended that he have a scan done for that.  I saw him in the office a year ago and there were no suspicious lesions on the scan.  He says that in the interim since his last visit he has been treated for pneumonia twice.  He is not currently having any respiratory issues.  He quit smoking many years ago.  2 nights ago he had gotten up to go to the bathroom and passed out in the bathroom.  He hit the right side of his chest.  He has pain in the right lateral rib cage.  Past Medical History:  Diagnosis Date  . CAD S/P percutaneous coronary angioplasty 1998   PCI TO CX  . Diabetes mellitus without complication ([place])   . Diverticulitis 08/16/2017   hospitalized with diverticulitis/sepsis  . GERD (gastroesophageal reflux disease)   . Headache   . 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 Hypercholesteremia   . Hypertension   . Neuropathy   . Peripheral vascular disease ([place])    s/p R-L FEM-FEM BYPASS  . Pneumonia   . Tachycardia    after CABG, pt on medicine for this    Current Outpatient Medications  Medication Sig Dispense Refill  . albuterol (PROVENTIL HFA;VENTOLIN HFA) 108 (90 Base) MCG/ACT inhaler Inhale 2 puffs into the lungs every 6 (six) hours as needed for wheezing or shortness of breath. 1 Inhaler 2  . aspirin 81 MG tablet Take 81 mg by mouth daily.    . Cholecalciferol (VITAMIN D) 2000 UNITS tablet Take 2,000 Units by mouth daily.    . cilostazol  (PLETAL) 100 MG tablet Take 100 mg by mouth 2 (two) times daily.    . ferrous sulfate 325 (65 FE) MG tablet Take 325 mg by mouth daily.     [name] gabapentin (NEURONTIN) 300 MG capsule Take 300-600 mg by mouth 2 (two) times daily. 365m in the morning and 6076mat bedtime    . guaiFENesin (MUCINEX) 600 MG 12 hr tablet Take 1 tablet (600 mg total) by mouth 2 (two) times daily. 60 tablet 2  . insulin glargine (LANTUS) 100 UNIT/ML injection Inject 0.2 mLs (20 Units total) into the skin at bedtime. 10 mL 11  . linaclotide (LINZESS) 145 MCG CAPS capsule Take 1 capsule (145 mcg total) by mouth daily before breakfast. 30 capsule 5  . metoprolol tartrate (LOPRESSOR) 25 MG tablet Take 25 mg by mouth 2 (two) times daily.    . pantoprazole (PROTONIX) 40 MG tablet Take 1 tablet (40 mg total) by mouth daily. 5 tablet 0  . simvastatin (ZOCOR) 80 MG tablet Take 40 mg by mouth at bedtime.     . vitamin B-12 (CYANOCOBALAMIN) 1000 MCG tablet Take 1,000 mcg by mouth daily.     No current facility-administered medications for this visit.  Physical Exam BP (!) 149/75 (BP Location: Left Arm, Patient Position: Sitting, Cuff Size: Normal)   Pulse 69   Resp 16   Ht '5\' 6"'  (1.676 m)   Wt 147 lb (66.7 kg)   BMI 23.69 kg/m  72 year old man in no acute distress Alert and oriented x3 with no focal deficits No cervical or supra clavicular adenopathy Cardiac regular rate and rhythm normal S1 and S2 Lungs clear with equal breath sounds bilaterally Tenderness to palpation of the right lateral chest wall  Diagnostic Tests: CT CHEST WITHOUT CONTRAST  TECHNIQUE: Multidetector CT imaging of the chest was performed following the standard protocol without IV contrast.  COMPARISON:  08/17/2017, 12/05/2016, 10/13/2015  FINDINGS: Cardiovascular: Atherosclerotic changes of the aorta are noted. Changes of prior coronary bypass grafting are seen. No cardiac enlargement is seen. No pericardial fluid is  noted.  Mediastinum/Nodes: Thoracic inlet is unremarkable. No hilar or mediastinal adenopathy is seen. The esophagus is unremarkable.  Lungs/Pleura: Lungs are well aerated bilaterally. Scattered areas of ground-glass density are identified throughout the right lung. These are new from the prior exam. These likely represent postinflammatory change. Previously seen nodule in the right middle lobe is stable in appearance when compared with previous exams dating back to 12/05/2016. No other significant nodules are noted.  Upper Abdomen: Visualized upper abdomen demonstrates right renal cyst stable from the previous exam.  Musculoskeletal: Degenerative changes of the thoracic spine are noted. No acute rib abnormality is noted.  IMPRESSION: Previously seen right middle lobe nodular changes are stable from previous exam dating back to 10/13/2015  Scattered ground-glass densities are noted throughout the right lung likely postinflammatory in nature and improved from previous exams. No new focal infiltrate is seen.  No acute rib abnormality is noted.  Aortic Atherosclerosis (ICD10-I70.0).   Electronically Signed   By: Inez Catalina M.D.   On: 11/27/2017 12:41  I personally reviewed the CT images and concur with the findings noted above.  There are multiple groundglass opacities particularly in the right upper lobe.  Impression: [name] is a 72 year old man with a past history of tobacco abuse and atherosclerotic cardiovascular disease.  He had coronary bypass grafting x5 in March 2017.  He qualify for low-dose CT screening and had a scan a year ago.  There were no suspicious lesions at that time.  On his scan today there were multiple small groundglass opacities diffusely throughout the lungs but particularly in the right upper lobe.  I suspect these are postinflammatory in nature.  They really are too numerous to count.  We will plan on doing another CT in a year to follow  those up.  Rib pain-no fracture on CT.  Fall-advised him to contact cardiology if he has any other episodes of presyncope or syncope  Coronary artery disease-no anginal symptoms  Plan: Return in 1 year with CT chest to follow-up groundglass opacities  [name], MD [facility] [phone]

## 2017-11-27 NOTE — Progress Notes (Signed)
LMOAM for return call. Patient has a lot of stool in the colon. He still has 7cm size region at the proximal sigmoid colon with mild surrounding inflammatory changes, this could be a giant diverticulum containing stool or chronic contained walled off perforation/abscess with communication with sigmoid lumen.  Cannot exclude underlying mass completely.  Discussed findings with Dr. Buford Dresser.  He does not feel is safe to try colonoscopy as he would be at increased risk of perforation.  He would like to send him to a surgeon to consider diagnostic surgery with resection as needed.  He saw Dr. Constance Haw during his hospitalization, could send back there if he would like.

## 2017-11-27 NOTE — Telephone Encounter (Signed)
Noted  

## 2017-11-27 NOTE — Telephone Encounter (Signed)
PLEASE CALL PATIENT WHEN HIS CT RESULTS ARE IN , HE IS WORRIED.

## 2017-11-27 NOTE — Telephone Encounter (Signed)
Pt was notified.  

## 2017-11-27 NOTE — Telephone Encounter (Signed)
Tried to call pt, no answer, left detailed message on VM.

## 2017-11-27 NOTE — Telephone Encounter (Signed)
Syncope definitely needs to be evaluated as soon as possible. Tell them to keep [place] posted on outcome/work up.

## 2017-11-27 NOTE — Telephone Encounter (Signed)
Result note to follow.

## 2017-11-28 ENCOUNTER — Telehealth: Payer: Self-pay | Admitting: Internal Medicine

## 2017-11-28 ENCOUNTER — Telehealth: Payer: Self-pay

## 2017-11-28 NOTE — Telephone Encounter (Signed)
[phone] patient called and stated someone called him from here  Please call back

## 2017-11-28 NOTE — Telephone Encounter (Signed)
I spoke with daughter this evening. We discussed CT findings and rationale for surgery evaluation. Likelihood that he would need an operation. She reports that they want the procedure done in [place]. She plans to discuss further with her dad tonight. She request that we proceed with referral to surgeon in [place].   Per Dr. Gala Romney, send referral for patient to see Dr. Fanny Skates ONLY as soon as possible for abnormal sigmoid colon on CT in 08/2017 and 11/2017, consider exploratory surgery +/- resection. At risk of perforation, therefore colonoscopy to be attempted to evaluate CT findings.

## 2017-11-28 NOTE — Telephone Encounter (Signed)
Routing message to LSL.  

## 2017-11-28 NOTE — Progress Notes (Signed)
LMOAM for return call.  

## 2017-11-28 NOTE — Telephone Encounter (Signed)
Pt's daughter Jerene Dilling) called office and LMOVM stating she had surgeon information. Called daughter back. She states he has appt with Dr. Arnoldo Morale tomorrow and if he needs surgery it will be done by Dr. Constance Haw.   Daughter has questions about CT abd/pelvis. She is aware LSL has been trying to contact pt. She also requests for LSL to call her. [phone]  (According to note in Epic, pt gave permission for all [facility] practices to release PHI to wife and daughter Jerene Dilling).

## 2017-11-28 NOTE — Telephone Encounter (Signed)
Spoke with patient. Questions answered. Requested I speak with daughter as well. He has appointment tomorrow with Dr. Renold Genta office. I will follow up outcome.

## 2017-11-28 NOTE — Telephone Encounter (Addendum)
Spoke with patient. Documentation under result note.

## 2017-11-29 ENCOUNTER — Other Ambulatory Visit: Payer: Self-pay

## 2017-11-29 ENCOUNTER — Ambulatory Visit: Payer: Medicare Other | Admitting: General Surgery

## 2017-11-29 DIAGNOSIS — R933 Abnormal findings on diagnostic imaging of other parts of digestive tract: Secondary | ICD-10-CM

## 2017-11-29 NOTE — Telephone Encounter (Signed)
Referral faxed to [facility] for Dr. Dalbert Batman, marked ASAP.

## 2017-12-03 NOTE — Progress Notes (Signed)
See separate telephone note.

## 2017-12-05 ENCOUNTER — Other Ambulatory Visit: Payer: Self-pay

## 2017-12-05 ENCOUNTER — Encounter (HOSPITAL_COMMUNITY): Payer: Self-pay | Admitting: Emergency Medicine

## 2017-12-05 ENCOUNTER — Observation Stay (HOSPITAL_COMMUNITY)
Admission: EM | Admit: 2017-12-05 | Discharge: 2017-12-07 | Disposition: A | Discharge status: Home / Self Care | Payer: Medicare Other | Attending: Internal Medicine | Admitting: Internal Medicine

## 2017-12-05 DIAGNOSIS — N179 Acute kidney failure, unspecified: Secondary | ICD-10-CM | POA: Insufficient documentation

## 2017-12-05 DIAGNOSIS — K219 Gastro-esophageal reflux disease without esophagitis: Secondary | ICD-10-CM | POA: Diagnosis not present

## 2017-12-05 DIAGNOSIS — Z794 Long term (current) use of insulin: Secondary | ICD-10-CM | POA: Diagnosis not present

## 2017-12-05 DIAGNOSIS — K4090 Unilateral inguinal hernia, without obstruction or gangrene, not specified as recurrent: Secondary | ICD-10-CM | POA: Diagnosis not present

## 2017-12-05 DIAGNOSIS — K921 Melena: Principal | ICD-10-CM | POA: Insufficient documentation

## 2017-12-05 DIAGNOSIS — Z95820 Peripheral vascular angioplasty status with implants and grafts: Secondary | ICD-10-CM | POA: Diagnosis not present

## 2017-12-05 DIAGNOSIS — E1151 Type 2 diabetes mellitus with diabetic peripheral angiopathy without gangrene: Secondary | ICD-10-CM | POA: Diagnosis not present

## 2017-12-05 DIAGNOSIS — Z7902 Long term (current) use of antithrombotics/antiplatelets: Secondary | ICD-10-CM | POA: Diagnosis not present

## 2017-12-05 DIAGNOSIS — K625 Hemorrhage of anus and rectum: Secondary | ICD-10-CM | POA: Diagnosis present

## 2017-12-05 DIAGNOSIS — Z79899 Other long term (current) drug therapy: Secondary | ICD-10-CM | POA: Diagnosis not present

## 2017-12-05 DIAGNOSIS — E1122 Type 2 diabetes mellitus with diabetic chronic kidney disease: Secondary | ICD-10-CM | POA: Diagnosis not present

## 2017-12-05 DIAGNOSIS — E1165 Type 2 diabetes mellitus with hyperglycemia: Secondary | ICD-10-CM | POA: Diagnosis not present

## 2017-12-05 DIAGNOSIS — I10 Essential (primary) hypertension: Secondary | ICD-10-CM | POA: Diagnosis present

## 2017-12-05 DIAGNOSIS — I2510 Atherosclerotic heart disease of native coronary artery without angina pectoris: Secondary | ICD-10-CM | POA: Diagnosis not present

## 2017-12-05 DIAGNOSIS — I129 Hypertensive chronic kidney disease with stage 1 through stage 4 chronic kidney disease, or unspecified chronic kidney disease: Secondary | ICD-10-CM | POA: Insufficient documentation

## 2017-12-05 DIAGNOSIS — Z955 Presence of coronary angioplasty implant and graft: Secondary | ICD-10-CM | POA: Insufficient documentation

## 2017-12-05 DIAGNOSIS — E7800 Pure hypercholesterolemia, unspecified: Secondary | ICD-10-CM | POA: Diagnosis not present

## 2017-12-05 DIAGNOSIS — E871 Hypo-osmolality and hyponatremia: Secondary | ICD-10-CM | POA: Diagnosis not present

## 2017-12-05 DIAGNOSIS — K922 Gastrointestinal hemorrhage, unspecified: Secondary | ICD-10-CM

## 2017-12-05 DIAGNOSIS — Z951 Presence of aortocoronary bypass graft: Secondary | ICD-10-CM

## 2017-12-05 DIAGNOSIS — N281 Cyst of kidney, acquired: Secondary | ICD-10-CM | POA: Insufficient documentation

## 2017-12-05 DIAGNOSIS — Z87891 Personal history of nicotine dependence: Secondary | ICD-10-CM | POA: Diagnosis not present

## 2017-12-05 DIAGNOSIS — N183 Chronic kidney disease, stage 3 unspecified: Secondary | ICD-10-CM

## 2017-12-05 DIAGNOSIS — K5730 Diverticulosis of large intestine without perforation or abscess without bleeding: Secondary | ICD-10-CM | POA: Insufficient documentation

## 2017-12-05 DIAGNOSIS — K449 Diaphragmatic hernia without obstruction or gangrene: Secondary | ICD-10-CM | POA: Insufficient documentation

## 2017-12-05 DIAGNOSIS — E785 Hyperlipidemia, unspecified: Secondary | ICD-10-CM | POA: Insufficient documentation

## 2017-12-05 DIAGNOSIS — E119 Type 2 diabetes mellitus without complications: Secondary | ICD-10-CM

## 2017-12-05 DIAGNOSIS — E876 Hypokalemia: Secondary | ICD-10-CM | POA: Diagnosis not present

## 2017-12-05 DIAGNOSIS — K5732 Diverticulitis of large intestine without perforation or abscess without bleeding: Secondary | ICD-10-CM | POA: Diagnosis present

## 2017-12-05 DIAGNOSIS — E440 Moderate protein-calorie malnutrition: Secondary | ICD-10-CM

## 2017-12-05 DIAGNOSIS — Z7982 Long term (current) use of aspirin: Secondary | ICD-10-CM | POA: Diagnosis not present

## 2017-12-05 LAB — COMPREHENSIVE METABOLIC PANEL
ALBUMIN: 3.3 g/dL — AB (ref 3.5–5.0)
ALT: 12 U/L — ABNORMAL LOW (ref 17–63)
AST: 13 U/L — AB (ref 15–41)
Alkaline Phosphatase: 89 U/L (ref 38–126)
Anion gap: 15 (ref 5–15)
BUN: 29 mg/dL — AB (ref 6–20)
CO2: 27 mmol/L (ref 22–32)
Calcium: 9 mg/dL (ref 8.9–10.3)
Chloride: 88 mmol/L — ABNORMAL LOW (ref 101–111)
Creatinine, Ser: 2.06 mg/dL — ABNORMAL HIGH (ref 0.61–1.24)
GFR calc Af Amer: 36 mL/min — ABNORMAL LOW (ref >60)
GFR calc non Af Amer: 31 mL/min — ABNORMAL LOW (ref >60)
GLUCOSE: 392 mg/dL — AB (ref 65–99)
Potassium: 3.4 mmol/L — ABNORMAL LOW (ref 3.5–5.1)
SODIUM: 130 mmol/L — AB (ref 135–145)
Total Bilirubin: 0.6 mg/dL (ref 0.3–1.2)
Total Protein: 6.9 g/dL (ref 6.5–8.1)

## 2017-12-05 LAB — CBC
HEMATOCRIT: 40.3 % (ref 39.0–52.0)
Hemoglobin: 12.8 g/dL — ABNORMAL LOW (ref 13.0–17.0)
MCH: 27.8 pg (ref 26.0–34.0)
MCHC: 31.8 g/dL (ref 30.0–36.0)
MCV: 87.6 fL (ref 78.0–100.0)
Platelets: 401 10*3/uL — ABNORMAL HIGH (ref 150–400)
RBC: 4.6 MIL/uL (ref 4.22–5.81)
RDW: 13.9 % (ref 11.5–15.5)
WBC: 14.5 10*3/uL — ABNORMAL HIGH (ref 4.0–10.5)

## 2017-12-05 NOTE — ED Triage Notes (Signed)
Pt c/o having 2 bowel movement today that were bloody. Pt denies any pain.

## 2017-12-06 ENCOUNTER — Emergency Department (HOSPITAL_COMMUNITY): Payer: Medicare Other

## 2017-12-06 ENCOUNTER — Telehealth: Payer: Self-pay

## 2017-12-06 DIAGNOSIS — E440 Moderate protein-calorie malnutrition: Secondary | ICD-10-CM

## 2017-12-06 DIAGNOSIS — K922 Gastrointestinal hemorrhage, unspecified: Secondary | ICD-10-CM

## 2017-12-06 DIAGNOSIS — K625 Hemorrhage of anus and rectum: Secondary | ICD-10-CM

## 2017-12-06 DIAGNOSIS — R933 Abnormal findings on diagnostic imaging of other parts of digestive tract: Secondary | ICD-10-CM

## 2017-12-06 DIAGNOSIS — I10 Essential (primary) hypertension: Secondary | ICD-10-CM

## 2017-12-06 DIAGNOSIS — E1165 Type 2 diabetes mellitus with hyperglycemia: Secondary | ICD-10-CM

## 2017-12-06 DIAGNOSIS — E871 Hypo-osmolality and hyponatremia: Secondary | ICD-10-CM

## 2017-12-06 DIAGNOSIS — Z794 Long term (current) use of insulin: Secondary | ICD-10-CM

## 2017-12-06 DIAGNOSIS — N179 Acute kidney failure, unspecified: Secondary | ICD-10-CM

## 2017-12-06 DIAGNOSIS — K5733 Diverticulitis of large intestine without perforation or abscess with bleeding: Secondary | ICD-10-CM

## 2017-12-06 DIAGNOSIS — N183 Chronic kidney disease, stage 3 unspecified: Secondary | ICD-10-CM

## 2017-12-06 DIAGNOSIS — K4090 Unilateral inguinal hernia, without obstruction or gangrene, not specified as recurrent: Secondary | ICD-10-CM | POA: Diagnosis not present

## 2017-12-06 LAB — HEMOGLOBIN AND HEMATOCRIT, BLOOD
HCT: 38.3 % — ABNORMAL LOW (ref 39.0–52.0)
HCT: 34.9 % — ABNORMAL LOW (ref 39.0–52.0)
HEMATOCRIT: 34.4 % — AB (ref 39.0–52.0)
HEMOGLOBIN: 12.3 g/dL — AB (ref 13.0–17.0)
Hemoglobin: 11 g/dL — ABNORMAL LOW (ref 13.0–17.0)
Hemoglobin: 11.2 g/dL — ABNORMAL LOW (ref 13.0–17.0)

## 2017-12-06 LAB — CBC
HCT: 39.5 % (ref 39.0–52.0)
Hemoglobin: 12.8 g/dL — ABNORMAL LOW (ref 13.0–17.0)
MCH: 28.1 pg (ref 26.0–34.0)
MCHC: 32.4 g/dL (ref 30.0–36.0)
MCV: 86.8 fL (ref 78.0–100.0)
PLATELETS: 386 10*3/uL (ref 150–400)
RBC: 4.55 MIL/uL (ref 4.22–5.81)
RDW: 13.9 % (ref 11.5–15.5)
WBC: 9.2 10*3/uL (ref 4.0–10.5)

## 2017-12-06 LAB — BASIC METABOLIC PANEL
ANION GAP: 15 (ref 5–15)
BUN: 27 mg/dL — ABNORMAL HIGH (ref 6–20)
CO2: 28 mmol/L (ref 22–32)
Calcium: 9.2 mg/dL (ref 8.9–10.3)
Chloride: 90 mmol/L — ABNORMAL LOW (ref 101–111)
Creatinine, Ser: 1.45 mg/dL — ABNORMAL HIGH (ref 0.61–1.24)
GFR, EST AFRICAN AMERICAN: 55 mL/min — AB (ref >60)
GFR, EST NON AFRICAN AMERICAN: 47 mL/min — AB (ref >60)
Glucose, Bld: 133 mg/dL — ABNORMAL HIGH (ref 65–99)
POTASSIUM: 3.9 mmol/L (ref 3.5–5.1)
SODIUM: 133 mmol/L — AB (ref 135–145)

## 2017-12-06 LAB — HEMOGLOBIN A1C
Hgb A1c MFr Bld: 9.3 % — ABNORMAL HIGH (ref 4.8–5.6)
Mean Plasma Glucose: 220.21 mg/dL

## 2017-12-06 LAB — GLUCOSE, CAPILLARY
GLUCOSE-CAPILLARY: 157 mg/dL — AB (ref 65–99)
GLUCOSE-CAPILLARY: 149 mg/dL — AB (ref 65–99)
GLUCOSE-CAPILLARY: 228 mg/dL — AB (ref 65–99)
Glucose-Capillary: 223 mg/dL — ABNORMAL HIGH (ref 65–99)
Glucose-Capillary: 250 mg/dL — ABNORMAL HIGH (ref 65–99)

## 2017-12-06 LAB — TYPE AND SCREEN
ABO/RH(D): A POS
Antibody Screen: NEGATIVE

## 2017-12-06 LAB — MRSA PCR SCREENING: MRSA BY PCR: NEGATIVE

## 2017-12-06 LAB — CBG MONITORING, ED: GLUCOSE-CAPILLARY: 212 mg/dL — AB (ref 65–99)

## 2017-12-06 MED ORDER — ONDANSETRON HCL 4 MG PO TABS: 4.0000 mg | ORAL_TABLET | Freq: Four times a day (QID) | ORAL | Status: DC | PRN

## 2017-12-06 MED ORDER — PRO-STAT SUGAR FREE PO LIQD
30.0000 mL | Freq: Two times a day (BID) | ORAL | Status: DC
  Administered 2017-12-06 – 2017-12-07 (×3): 30 mL via ORAL
  Filled 2017-12-06 (×3): qty 30

## 2017-12-06 MED ORDER — CIPROFLOXACIN IN D5W 400 MG/200ML IV SOLN
400.0000 mg | Freq: Two times a day (BID) | INTRAVENOUS | Status: DC
  Administered 2017-12-06 (×2): 400 mg via INTRAVENOUS
  Filled 2017-12-06 (×3): qty 200

## 2017-12-06 MED ORDER — POTASSIUM CHLORIDE 10 MEQ/100ML IV SOLN
10.0000 meq | INTRAVENOUS | Status: AC
  Administered 2017-12-06: 10 meq via INTRAVENOUS
  Filled 2017-12-06: qty 100

## 2017-12-06 MED ORDER — ONDANSETRON HCL 4 MG/2ML IJ SOLN: 4.0000 mg | Freq: Four times a day (QID) | INTRAMUSCULAR | Status: DC | PRN

## 2017-12-06 MED ORDER — BOOST / RESOURCE BREEZE PO LIQD CUSTOM
1.0000 | Freq: Three times a day (TID) | ORAL | Status: DC
  Administered 2017-12-06 – 2017-12-07 (×3): 1 via ORAL

## 2017-12-06 MED ORDER — SODIUM CHLORIDE 0.9 % IV SOLN
INTRAVENOUS | Status: AC
  Administered 2017-12-06: 05:00:00 via INTRAVENOUS

## 2017-12-06 MED ORDER — IOPAMIDOL (ISOVUE-300) INJECTION 61%
30.0000 mL | Freq: Once | INTRAVENOUS | Status: AC | PRN
  Administered 2017-12-06: 30 mL via ORAL

## 2017-12-06 MED ORDER — INSULIN GLARGINE 100 UNIT/ML ~~LOC~~ SOLN
10.0000 [IU] | Freq: Every day | SUBCUTANEOUS | Status: DC
  Administered 2017-12-06: 10 [IU] via SUBCUTANEOUS
  Filled 2017-12-06 (×4): qty 0.1

## 2017-12-06 MED ORDER — METRONIDAZOLE IN NACL 5-0.79 MG/ML-% IV SOLN
500.0000 mg | Freq: Three times a day (TID) | INTRAVENOUS | Status: DC
  Administered 2017-12-06 – 2017-12-07 (×3): 500 mg via INTRAVENOUS
  Filled 2017-12-06 (×3): qty 100

## 2017-12-06 MED ORDER — INSULIN ASPART 100 UNIT/ML ~~LOC~~ SOLN
0.0000 [IU] | SUBCUTANEOUS | Status: DC
  Administered 2017-12-06: 4 [IU] via SUBCUTANEOUS
  Administered 2017-12-06: 3 [IU] via SUBCUTANEOUS
  Administered 2017-12-06 (×4): 7 [IU] via SUBCUTANEOUS
  Administered 2017-12-07: 3 [IU] via SUBCUTANEOUS
  Filled 2017-12-06: qty 1

## 2017-12-06 MED ORDER — ADULT MULTIVITAMIN W/MINERALS CH
1.0000 | ORAL_TABLET | Freq: Every day | ORAL | Status: DC
  Administered 2017-12-06 – 2017-12-07 (×2): 1 via ORAL
  Filled 2017-12-06 (×2): qty 1

## 2017-12-06 MED ORDER — METOPROLOL TARTRATE 25 MG PO TABS
12.5000 mg | ORAL_TABLET | Freq: Two times a day (BID) | ORAL | Status: DC
Start: 2017-12-06 — End: 2017-12-07
  Administered 2017-12-06 – 2017-12-07 (×3): 12.5 mg via ORAL
  Filled 2017-12-06 (×3): qty 1

## 2017-12-06 MED ORDER — POTASSIUM CHLORIDE IN NACL 20-0.9 MEQ/L-% IV SOLN
INTRAVENOUS | Status: DC
  Administered 2017-12-06: 08:00:00 via INTRAVENOUS

## 2017-12-06 MED ORDER — SIMVASTATIN 20 MG PO TABS
40.0000 mg | ORAL_TABLET | Freq: Every day | ORAL | Status: DC
  Administered 2017-12-06: 40 mg via ORAL
  Filled 2017-12-06: qty 2

## 2017-12-06 NOTE — ED Notes (Signed)
Pt resting and wife given a recliner to sit in.

## 2017-12-06 NOTE — ED Provider Notes (Signed)
[facility] EMERGENCY DEPARTMENT Provider Note   CSN: [id number] Arrival date & time: 12/05/17  2157     History   Chief Complaint Chief Complaint  Patient presents with  . Rectal Bleeding    HPI [name] is a 72 y.o. male.  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  He presents today with complaints of rectal bleeding.  The patient and his wife report 2 episodes of loose stool that was virtually all bright red blood.  He denies to me that he is experiencing any additional pain or fevers.  The wife does state that the bowel movement was quite malodoro  The history is provided by the patient.  Rectal Bleeding  Quality:  Bright red Amount:  Moderate Similar prior episodes: no   Relieved by:  Nothing Worsened by:  Nothing Ineffective treatments:  None tried Associated symptoms: no abdominal pain and no fever     Past Medical History:  Diagnosis Date  . CAD S/P percutaneous coronary angioplasty 1998   PCI TO CX  . Diabetes mellitus without complication ([place])   . Diverticulitis 08/16/2017   hospitalized with diverticulitis/sepsis  . GERD (gastroesophageal reflux disease)   . Headache   . 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 Hypercholesteremia   . Hypertension   . Neuropathy   . Peripheral vascular disease ([place])    s/p R-L FEM-FEM BYPASS  . Pneumonia   . Tachycardia    after CABG, pt on medicine for this    Patient Active Problem List   Diagnosis Date Noted  . Abnormal CT scan, colon 11/23/2017  . Constipation 11/23/2017  . Lobar pneumonia ([place]) 10/02/2017  . Sepsis due to pneumonia ([place]) 10/01/2017  . Diverticulitis large intestine 08/19/2017  . Diverticulitis of large intestine with perforation and abscess without bleeding   . CKD (chronic kidney disease) stage 3, GFR 30-59 ml/min (HCC) 08/02/2017  . Sepsis due to  undetermined organism ([place]) 08/01/2017  . GERD (gastroesophageal reflux disease) 08/01/2017  . Bacteremia 06/05/2017  . SIRS (systemic inflammatory response syndrome) ([place]) 05/31/2017  . PAD (peripheral artery disease) ([place]) 08/09/2016  . Claudication ([place]) 05/08/2016  . HCAP (healthcare-associated pneumonia) 01/12/2016  . Hyponatremia 01/12/2016  . Abnormal chest CT 10/13/2015  . Type 2 diabetes, HbA1c goal < 7% (HCC)   . Unstable angina ([place])   . Hypomagnesemia   . Healthcare-associated pneumonia 10/09/2015  . Type 2 diabetes mellitus ([place]) 10/09/2015  . ASCVD (arteriosclerotic cardiovascular disease) 10/09/2015  . CKD (chronic kidney disease) stage 2, GFR 60-89 ml/min 10/09/2015  . CAP (community acquired pneumonia) 07/09/2015  . Sepsis ([place]) 07/09/2015  . 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 PVD (peripheral vascular disease) ([place]) 07/09/2015  . Essential hypertension 07/09/2015  . Hypotension 07/09/2015  . Neuropathy, peripheral 07/09/2015  . HLD (hyperlipidemia) 07/09/2015  . AKI (acute kidney injury) ([place]) 07/09/2015    Past Surgical History:  Procedure Laterality Date  . ABDOMINAL AORTOGRAM Bilateral 09/19/2016   Procedure: iliac;  Surgeon: Serafina Mitchell, MD;  Location: [facility];  Service: Cardiovascular;  Laterality: Bilateral;  . CARDIAC CATHETERIZATION  2003   with stent  . CARDIAC CATHETERIZATION N/A 10/11/2015   Procedure: Left Heart Cath and Coronary Angiography;  Surgeon: Peter M Martinique, MD;  Location: [facility];  Service: Cardiovascular;  Laterality: N/A;  .  COLONOSCOPY N/A 09/22/2013   Procedure: COLONOSCOPY;  Surgeon: Daneil Dolin, MD;  [facility];  Service: Endoscopy;  Laterality: N/A;  9:30 AM  . CORONARY ARTERY BYPASS GRAFT N/A 10/18/2015   Procedure: CORONARY ARTERY BYPASS GRAFTING (CABG) x  five, using left internal mammary artery and right leg greater saphenous vein harvested endoscopically;  Surgeon: [name], MD;  Location: [facility];  Service: Open Heart Surgery;  Laterality: N/A;  . ENDARTERECTOMY FEMORAL Right 08/09/2016   Procedure: ENDARTERECTOMY FEMORAL WITH VEIN PATCH ANGIOPLASTY;  Surgeon: Serafina Mitchell, MD;  Location: [facility];  Service: Vascular;  Laterality: Right;  . ESOPHAGOGASTRODUODENOSCOPY N/A 10/24/2017   Dr. Gala Romney: hiatal hernia  . FEMORAL-FEMORAL BYPASS GRAFT Bilateral 08/09/2016   Procedure: REVISION BYPASS GRAFT RIGHT FEMORAL-LEFT FEMORAL ARTERY;  Surgeon: Serafina Mitchell, MD;  Location: [facility];  Service: Vascular;  Laterality: Bilateral;  . FEMORAL-POPLITEAL BYPASS GRAFT    . PERIPHERAL VASCULAR CATHETERIZATION N/A 05/08/2016   Procedure: Lower Extremity Angiography;  Surgeon: Lorretta Harp, MD;  Location: [facility];  Service: Cardiovascular;  Laterality: N/A;  . PERIPHERAL VASCULAR INTERVENTION Right 09/19/2016   Procedure: Peripheral Vascular Intervention;  Surgeon: Serafina Mitchell, MD;  Location: [facility];  Service: Cardiovascular;  Laterality: Right;  ext iliac stent  . PROCTOSCOPY  10/24/2017   Procedure: PROCTOSCOPY;  Surgeon: Daneil Dolin, MD;  [facility];  Service: Endoscopy;;  . TEE WITHOUT CARDIOVERSION N/A 10/18/2015   Procedure: TRANSESOPHAGEAL ECHOCARDIOGRAM (TEE);  Surgeon: [name], MD;  Location: [facility];  Service: Open Heart Surgery;  Laterality: N/A;        Home Medications    Prior to Admission medications   Medication Sig Start Date End Date Taking? Authorizing Provider  albuterol (PROVENTIL HFA;VENTOLIN HFA) 108 (90 Base) MCG/ACT inhaler Inhale 2 puffs into the lungs every 6 (six) hours as needed for wheezing or shortness of breath. 10/03/17   Kathie Dike, MD  aspirin 81 MG tablet Take 81 mg by mouth daily.    [provider]  Cholecalciferol (VITAMIN D) 2000 UNITS tablet Take 2,000 Units by mouth daily.    [provider]  cilostazol (PLETAL) 100 MG tablet Take 100 mg by mouth 2  (two) times daily.    [provider]  ferrous sulfate 325 (65 FE) MG tablet Take 325 mg by mouth daily.     [provider]  gabapentin (NEURONTIN) 300 MG capsule Take 300-600 mg by mouth 2 (two) times daily. 300mg  in the morning and 600mg  at bedtime    [provider]  guaiFENesin (MUCINEX) 600 MG 12 hr tablet Take 1 tablet (600 mg total) by mouth 2 (two) times daily. 10/03/17 10/03/18  Kathie Dike, MD  insulin glargine (LANTUS) 100 UNIT/ML injection Inject 0.2 mLs (20 Units total) into the skin at bedtime. 08/03/17   Isaac Bliss, Rayford Halsted, MD  linaclotide [facility]) 145 MCG CAPS capsule Take 1 capsule (145 mcg total) by mouth daily before breakfast. 11/23/17   Mahala Menghini, PA-C  metoprolol tartrate (LOPRESSOR) 25 MG tablet Take 25 mg by mouth 2 (two) times daily.    [provider]  pantoprazole (PROTONIX) 40 MG tablet Take 1 tablet (40 mg total) by mouth daily. 08/19/17   Dhungel, Flonnie Overman, MD  simvastatin (ZOCOR) 80 MG tablet Take 40 mg by mouth at bedtime.     [provider]  vitamin B-12 (CYANOCOBALAMIN) 1000 MCG tablet Take 1,000 mcg by  mouth daily.    [provider]    Family History Family History  Problem Relation Age of Onset  . Heart attack Mother   . Stroke Mother   . Colon cancer Neg Hx     Social History Social History   Tobacco Use  . Smoking status: Former Smoker    Packs/day: 2.00    Years: 30.00    Pack years: 60.00    Types: Cigarettes    Last attempt to quit: 08/14/2000    Years since quitting: 17.3  . Smokeless tobacco: Never Used  Substance Use Topics  . Alcohol use: No  . Drug use: No     Allergies   No known allergies   Review of Systems Review of Systems  Constitutional: Negative for fever.  Gastrointestinal: Positive for hematochezia. Negative for abdominal pain.  All other systems reviewed and are negative.    Physical Exam Updated Vital Signs BP (!) 164/67 (BP Location: Right  Arm)   Pulse 86   Temp 99.1 F (37.3 C) (Temporal)   Resp 13   Ht 5\' 6"  (1.676 m)   Wt 66.7 kg (147 lb)   SpO2 100%   BMI 23.73 kg/m   Physical Exam  Constitutional: He is oriented to person, place, and time. He appears well-developed and well-nourished. No distress.  HENT:  Head: Normocephalic and atraumatic.  Mouth/Throat: Oropharynx is clear and moist.  Neck: Normal range of motion. Neck supple.  Cardiovascular: Normal rate and regular rhythm. Exam reveals no friction rub.  No murmur heard. Pulmonary/Chest: Effort normal and breath sounds normal. No respiratory distress. He has no wheezes. He has no rales.  Abdominal: Soft. Bowel sounds are normal. He exhibits no distension. There is no tenderness.  There is mild tenderness to the suprapubic region.  Musculoskeletal: Normal range of motion. He exhibits no edema.  Neurological: He is alert and oriented to person, place, and time. Coordination normal.  Skin: Skin is warm and dry. He is not diaphoretic.  Nursing note and vitals reviewed.    ED Treatments / Results  Labs (all labs ordered are listed, but only abnormal results are displayed) Labs Reviewed  COMPREHENSIVE METABOLIC PANEL - Abnormal; Notable for the following components:      Result Value   Sodium 130 (*)    Potassium 3.4 (*)    Chloride 88 (*)    Glucose, Bld 392 (*)    BUN 29 (*)    Creatinine, Ser 2.06 (*)    Albumin 3.3 (*)    AST 13 (*)    ALT 12 (*)    GFR calc non Af Amer 31 (*)    GFR calc Af Amer 36 (*)    All other components within normal limits  CBC - Abnormal; Notable for the following components:   WBC 14.5 (*)    Hemoglobin 12.8 (*)    Platelets 401 (*)    All other components within normal limits  POC OCCULT BLOOD, ED  TYPE AND SCREEN    EKG None  Radiology No results found.  Procedures Procedures (including critical care time)  Medications Ordered in ED Medications - No data to display   Initial Impression / Assessment  and Plan / ED Course  I have reviewed the triage vital signs and the nursing notes.  Pertinent labs & imaging results that were available during my care of the patient were reviewed by me and considered in my medical decision making (see chart for details).  Patient  with history of giant diverticulum/diverticulitis presenting with rectal bleeding.  According to him and his wife, he had 2 episodes of large quantity hematochezia.  He appears hemodynamically stable and his hemoglobin has not dropped.  He does have a white count of 14,000.  I discussed the patient's presentation with Dr. Laural Golden from gastroenterology.  We agree that a CT scan is warranted to rule out perforation or recurrent diverticulitis.  This was performed and was unremarkable.  I suspect the patient's bleeding is diverticular in nature and feel as though admission for observation would be appropriate.  I have spoken with Dr. Manuella Ghazi who agrees to admit.  Final Clinical Impressions(s) / ED Diagnoses   Final diagnoses:  None    ED Discharge Orders    None       Veryl Speak, MD 12/06/17 [phone]

## 2017-12-06 NOTE — Progress Notes (Signed)
PROGRESS NOTE  [name]:[id number] DOB: 05-24-1946 DOA: 12/05/2017 PCP: [name], MD  Brief History:  72 year old male with a history of coronary artery disease status post CABG, hypertension, hyperlipidemia, diabetes mellitus, peripheral vascular disease presenting with hematochezia at 8 PM on 12/05/2017.  Patient had 2 large bloody bowel movements which prompted him to seek medical care.  Prior to this, the patient stated he had had intermittent small amounts of hematochezia without any melena.  He denied any fevers, chills, abdominal pain, nausea, vomiting, hematemesis.  Patient denies any NSAIDs.  There is no dysuria or hematuria.  Upon presentation, the patient was noted to have WBC 14.5 with hemoglobin 12.8 and serum creatinine 2.06 which is above his usual baseline.  GI was consulted to assist with management.  Assessment/Plan: Hematochezia -Suspect diverticular bleed -12/06/2017 CT abdomen--short segment sigmoid wall thickening unchanged; large stool-filled outpouching with fat stranding in the sigmoid colon -GI consult -Hgb stable -Monitor hemoglobin -Clear liquid diet for now  Abdominal pain -Concerned about diverticulitis with abdominal pain in LLQ and leukocytosis -start empiric cipro and flagyl  Acute on chronic renal failure--CKD stage III -Baseline creatinine 1.2-1.5 -presenting creatinine 2.05 -due to volume depletion -Continue IV fluids  Hyponatremia -due to volume depletion -am BMP -IVF  Coronary artery disease -No chest pain presently -holding ASA due to GIB -restart metoprolol tartrate  Diabetes mellitus type 2 -Continue reduced dose Lantus -Decrease NovoLog sliding scale to moderate scale -05/31/2017 hemoglobin A1c 9 point -Repeat hemoglobin A1c  Hyperlipidemia -Continue statin  Essential hypertension -Continue metoprolol tartrate  Hypokalemia -replete -check mag     Disposition Plan:   Home in 2-3 days  Family  Communication:  Spouse updated at bedside 4/25--Total time spent 35 minutes.  Greater than 50% spent face to face counseling and coordinating care. 730 to 0805   Consultants:  GI--[facility]  Code Status:  FULL   DVT Prophylaxis:  SCDs   Procedures: As Listed in Progress Note Above  Antibiotics: cipro 4/25>>> flaygyl 4/25>>>    Subjective: Patient has some intermittent hematochezia but no blood since admission.  He has some left lower quadrant abdominal pain.  Denies any fevers, chills, chest pain, shortness breath, nausea, vomiting, diarrhea, hematemesis.  There is no headache or dizziness.  Objective: Vitals:   12/06/17 0530 12/06/17 0600 12/06/17 0603 12/06/17 0615  BP: (!) 127/92 (!) 152/83  (!) 144/64  Pulse: 80 75  70  Resp: 12 18  (!) 9  Temp:   98.3 F (36.8 C)   TempSrc:   Oral   SpO2: 93% 98%  98%  Weight:   65.5 kg (144 lb 6.4 oz)   Height:   5\' 6"  (1.676 m)     Intake/Output Summary (Last 24 hours) at 12/06/2017 5638 Last data filed at 12/06/2017 0600 Gross per 24 hour  Intake 48.75 ml  Output -  Net 48.75 ml   Weight change:  Exam:   General:  Pt is alert, follows commands appropriately, not in acute distress  HEENT: No icterus, No thrush, No neck mass, [place]/AT  Cardiovascular: RRR, S1/S2, no rubs, no gallops  Respiratory: CTA bilaterally, no wheezing, no crackles, no rhonchi  Abdomen: Soft/+BS, LLQ tender, non distended, no guarding  Extremities: No edema, No lymphangitis, No petechiae, No rashes, no synovitis   Data Reviewed: I have personally reviewed following labs and imaging studies Basic Metabolic Panel: Recent Labs  Lab 12/05/17 2213  NA 130*  K 3.4*  CL 88*  CO2 27  GLUCOSE 392*  BUN 29*  CREATININE 2.06*  CALCIUM 9.0   Liver Function Tests: Recent Labs  Lab 12/05/17 2213  AST 13*  ALT 12*  ALKPHOS 89  BILITOT 0.6  PROT 6.9  ALBUMIN 3.3*   No results for input(s): LIPASE, AMYLASE in the last 168 hours. No  results for input(s): AMMONIA in the last 168 hours. Coagulation Profile: No results for input(s): INR, PROTIME in the last 168 hours. CBC: Recent Labs  Lab 12/05/17 2213 12/06/17 0543  WBC 14.5*  --   HGB 12.8* 12.3*  HCT 40.3 38.3*  MCV 87.6  --   PLT 401*  --    Cardiac Enzymes: No results for input(s): CKTOTAL, CKMB, CKMBINDEX, TROPONINI in the last 168 hours. BNP: Invalid input(s): POCBNP CBG: Recent Labs  Lab 12/06/17 0526 12/06/17 0732  GLUCAP 212* 157*   HbA1C: No results for input(s): HGBA1C in the last 72 hours. Urine analysis:    Component Value Date/Time   COLORURINE YELLOW 10/01/2017 1805   APPEARANCEUR CLEAR 10/01/2017 1805   LABSPEC 1.022 10/01/2017 1805   PHURINE 5.0 10/01/2017 1805   GLUCOSEU >=500 (A) 10/01/2017 1805   HGBUR SMALL (A) 10/01/2017 1805   BILIRUBINUR NEGATIVE 10/01/2017 1805   KETONESUR NEGATIVE 10/01/2017 1805   PROTEINUR NEGATIVE 10/01/2017 1805   UROBILINOGEN 0.2 03/16/2015 0120   NITRITE NEGATIVE 10/01/2017 1805   LEUKOCYTESUR NEGATIVE 10/01/2017 1805   Sepsis Labs: @LABRCNTIP (procalcitonin:4,lacticidven:4) )No results found for this or any previous visit (from the past 240 hour(s)).   Scheduled Meds: . insulin aspart  0-20 Units Subcutaneous Q4H  . insulin glargine  10 Units Subcutaneous QHS   Continuous Infusions: . sodium chloride 75 mL/hr at 12/06/17 0521  . potassium chloride Stopped (12/06/17 0626)    Procedures/Studies: Ct Abdomen Pelvis Wo Contrast  Result Date: 12/06/2017 CLINICAL DATA:  Bloody bowel movement. History of diverticulitis, fem-fem bypass, fem-pop bypass. EXAM: CT ABDOMEN AND PELVIS WITHOUT CONTRAST TECHNIQUE: Multidetector CT imaging of the abdomen and pelvis was performed following the standard protocol without IV contrast. COMPARISON:  CT abdomen and pelvis November 26, 2017 and August 17, 2017 FINDINGS: LOWER CHEST: Lung bases are clear. The visualized heart size is normal. Severe coronary artery  calcifications and/or stents. No pericardial effusion. HEPATOBILIARY: Normal. PANCREAS: Nonacute.  Atrophic. SPLEEN: Normal. ADRENALS/URINARY TRACT: Kidneys are orthotopic, demonstrating normal size and morphology. Bilateral vascular calcifications. 4.1 cm benign-appearing RIGHT upper pole cyst. 1 cm cyst LEFT upper pole. Too small to characterize hypodensities bilateral kidneys. Focal scarring LEFT kidney. No nephrolithiasis, hydronephrosis; limited assessment for renal masses on this nonenhanced examination. The unopacified ureters are normal in course and caliber. Urinary bladder is well distended and unremarkable. Normal adrenal glands. STOMACH/BOWEL: Short segment of sigmoid wall thickening, large stool filled outpouching with similar surrounding fat stranding. Numerous tiny surrounding lymph nodes. Moderate sigmoid diverticulosis. Moderate amount of retained large bowel stool. Small amount of small bowel feces compatible with chronic stasis. The stomach, small bowel are normal in course and caliber without inflammatory changes, sensitivity decreased by lack of enteric contrast. Normal appendix. VASCULAR/LYMPHATIC: Aortoiliac vessels are normal in course and caliber. Severe calcific atherosclerosis. 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 No lymphadenopathy by CT size criteria. REPRODUCTIVE: Normal. OTHER: No intraperitoneal free fluid or free air. MUSCULOSKELETAL: Non-acute. Status post median sternotomy. Moderate RIGHT and small to moderate LEFT fat containing inguinal hernias. Surgical clips LEFT inguinal soft tissues compatible with previous vascular access. Fem-fem bypass noted. Osteopenia. IMPRESSION:  1. Similar appearance of focal sigmoid colonic wall thickening and large stool filled inflamed outpouching. Differential diagnosis includes chronic abscess, giant diverticulum, chronically perforated neoplasm. Recommend direct inspection if not imaged during proctoscopy October 24, 2017. Aortic Atherosclerosis  (ICD10-I70.0). Electronically Signed   By: Elon Alas M.D.   On: 12/06/2017 02:27   Ct Chest Wo Contrast  Result Date: 11/27/2017 CLINICAL DATA:  Follow-up lung nodule, right-sided chest pain following fall 2 days ago, initial encounter EXAM: CT CHEST WITHOUT CONTRAST TECHNIQUE: Multidetector CT imaging of the chest was performed following the standard protocol without IV contrast. COMPARISON:  08/17/2017, 12/05/2016, 10/13/2015 FINDINGS: Cardiovascular: Atherosclerotic changes of the aorta are noted. Changes of prior coronary bypass grafting are seen. No cardiac enlargement is seen. No pericardial fluid is noted. Mediastinum/Nodes: Thoracic inlet is unremarkable. No hilar or mediastinal adenopathy is seen. The esophagus is unremarkable. Lungs/Pleura: Lungs are well aerated bilaterally. Scattered areas of ground-glass density are identified throughout the right lung. These are new from the prior exam. These likely represent postinflammatory change. Previously seen nodule in the right middle lobe is stable in appearance when compared with previous exams dating back to 12/05/2016. No other significant nodules are noted. Upper Abdomen: Visualized upper abdomen demonstrates right renal cyst stable from the previous exam. Musculoskeletal: Degenerative changes of the thoracic spine are noted. No acute rib abnormality is noted. IMPRESSION: Previously seen right middle lobe nodular changes are stable from previous exam dating back to 10/13/2015 Scattered ground-glass densities are noted throughout the right lung likely postinflammatory in nature and improved from previous exams. No new focal infiltrate is seen. No acute rib abnormality is noted. Aortic Atherosclerosis (ICD10-I70.0). Electronically Signed   By: Inez Catalina M.D.   On: 11/27/2017 12:41   Ct Abdomen Pelvis W Contrast  Result Date: 11/26/2017 CLINICAL DATA:  Left lower quadrant abdominal fullness. EXAM: CT ABDOMEN AND PELVIS WITH CONTRAST  TECHNIQUE: Multidetector CT imaging of the abdomen and pelvis was performed using the standard protocol following bolus administration of intravenous contrast. CONTRAST:  87mL ISOVUE-300 IOPAMIDOL (ISOVUE-300) INJECTION 61% COMPARISON:  08/17/2017 PET CT 11/30/2015.  CT abdomen 02/02/2005. FINDINGS: Lower chest: Normal Hepatobiliary: Normal Pancreas: Normal Spleen: Normal Adrenals/Urinary Tract: Adrenal glands are normal. Mild renal atrophy, left more than right. Bilateral renal cysts, the largest on the right measuring up to 4.5 cm in diameter. Extensive renal vascular calcification. Stomach/Bowel: Large amount of fecal matter in the colon. As seen previously, the patient has chronic diverticulosis throughout the sigmoid colon. At the proximal sigmoid, there is an approximately 7 cm region containing stool, in communication with the main lumen of the sigmoid colon, with mild surrounding inflammatory change. This could represent a giant diverticulum containing stool or could represent a chronic contained walled-off perforation an abscess in wide communication with sigmoid lumen. Vascular/Lymphatic: Aortic atherosclerosis. No aneurysm. Advanced aortic branch vessel atherosclerosis. Right to left femoral to femoral bypass. Reproductive: Normal Other: No free fluid or air. Musculoskeletal: Negative IMPRESSION: The area of clinical concern corresponds to an approximately 7 cm in diameter abnormality arising from the sigmoid colon which contains stool and contrast, apparently in wide communication with the sigmoid colon. The differential diagnosis is giant diverticulum versus chronic walled-off communicating cavity related to previous diverticulitis and abscess. Patient does have extensive diverticulosis of the sigmoid colon in that region without definite acute diverticulitis. The finding is similar to previous scans going back as far as April of 2017. Advanced aortic atherosclerosis and atherosclerosis of the branch  vessels. Electronically Signed  By: Nelson Chimes M.D.   On: 11/26/2017 14:24    Orson Eva, DO  [facility] Pager [phone]  If 7PM-7AM, please contact night-coverage [email or web address] Password [email or web address] 12/06/2017, 7:52 AM   LOS: 0 days

## 2017-12-06 NOTE — ED Notes (Signed)
Pt reporting that the potassium is burning his arm. Rate was changed twice for comfort of pt. Will notify Lysbeth Galas, RN about this change.

## 2017-12-06 NOTE — Telephone Encounter (Signed)
Spoke with daughter. Explained why transfer not happening at this time. Answered questions and discussed concerns. She is aware that Dr. Laural Golden will be seeing patient this afternoon as well.

## 2017-12-06 NOTE — ED Notes (Signed)
Pt up to the restroom without assistance 

## 2017-12-06 NOTE — Progress Notes (Signed)
Initial Nutrition Assessment  DOCUMENTATION CODES:   Non-severe (moderate) malnutrition in context of acute illness/injury  INTERVENTION:  Boost Breeze po TID, each supplement provides 250 kcal and 9 grams of protein  ProStat 30 ml TID (each 30 ml provides 100 kcal, 15 gr protein)   Multivitamin daily   NUTRITION DIAGNOSIS:   Moderate Malnutrition related to acute illness, decreased appetite, altered GI function(diverticulitis) as evidenced by mild fat depletion, moderate fat depletion, mild muscle depletion, moderate muscle depletion(clear liquids ). Unplanned wt loss >5% 1 mo and 10% <5 months.   GOAL:   Patient will meet greater than or equal to 90% of their needs  MONITOR:   Diet advancement, Supplement acceptance, Weight trends, Labs, PO intake  REASON FOR ASSESSMENT:   Malnutrition Screening Tool    ASSESSMENT:  [name] is a 72 yo male. In January of this year pt had perforation and abscess of his large intestine, in February he was treated for sepsis associated with lobar pneumonia. On 4/12 abnormal CT -colon and constipation. He presents Hematochezia -suspected diverticular bleed per MD. He also has acute on chronic CKD-3, hyponatremia, hypokalemia, and DM- 2.   Patient weight is trending down from usual range of 155-160lb. Weight hx shows loss of 5.5% x 1 mo, 10% < 5 months both are significant for timeframe.   His wife says he just not eating as well as he used and when pressed for example: for the past month he has not been finishing his meals (or consuming as much food) compared to what he was 3-6 months ago. He has experienced multiple health concerns noted above likely contributing factors in decreased appetite and at times medical necessity limiting his opportunity to maintain sufficient oral intake (such as his episodes of diverticulitis) in recent months.  Currently on clear liquids- he drank his juice and is willing to try the [facility] while he on  clears. He will also needs a supplemental protein source pending diet advancement.  Patient is at least moderate malnourished and at significant risk for worsening nutrition status if he is extend time NPO/Clear liquids.    Labs: BMP Latest Ref Rng & Units 12/06/2017 12/05/2017 11/26/2017  Glucose 65 - 99 mg/dL 133(H) 392(H) -  BUN 6 - 20 mg/dL 27(H) 29(H) -  Creatinine 0.61 - 1.24 mg/dL 1.45(H) 2.06(H) 1.50(H)  Sodium 135 - 145 mmol/L 133(L) 130(L) -  Potassium 3.5 - 5.1 mmol/L 3.9 3.4(L) -  Chloride 101 - 111 mmol/L 90(L) 88(L) -  CO2 22 - 32 mmol/L 28 27 -  Calcium 8.9 - 10.3 mg/dL 9.2 9.0 -    Meds: . insulin aspart  0-20 Units Subcutaneous Q4H  . insulin glargine  10 Units Subcutaneous QHS  . metoprolol tartrate  12.5 mg Oral BID  . simvastatin  40 mg Oral QHS   Active Ambulatory Problems    Diagnosis Date Noted  . CAP (community acquired pneumonia) 07/09/2015  . Sepsis ([place]) 07/09/2015  . 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 PVD (peripheral vascular disease) ([place]) 07/09/2015  . Essential hypertension 07/09/2015  . Hypotension 07/09/2015  . Neuropathy, peripheral 07/09/2015  . HLD (hyperlipidemia) 07/09/2015  . AKI (acute kidney injury) ([place]) 07/09/2015  . Healthcare-associated pneumonia 10/09/2015  . Type 2 diabetes mellitus ([place]) 10/09/2015  . ASCVD (arteriosclerotic cardiovascular disease) 10/09/2015  . CKD (chronic kidney disease) stage 2, GFR 60-89 ml/min 10/09/2015  . Unstable angina ([place])   . Hypomagnesemia   . Abnormal chest  CT 10/13/2015  . Type 2 diabetes, HbA1c goal < 7% (HCC)   . HCAP (healthcare-associated pneumonia) 01/12/2016  . Hyponatremia 01/12/2016  . Claudication ([place]) 05/08/2016  . PAD (peripheral artery disease) ([place]) 08/09/2016  . SIRS (systemic inflammatory response syndrome) ([place]) 05/31/2017  . Bacteremia 06/05/2017  . Sepsis due to undetermined organism ([place]) 08/01/2017  . GERD (gastroesophageal reflux disease) 08/01/2017  . CKD  (chronic kidney disease) stage 3, GFR 30-59 ml/min (HCC) 08/02/2017  . Diverticulitis of large intestine with perforation and abscess without bleeding   . Diverticulitis large intestine 08/19/2017  . Sepsis due to pneumonia ([place]) 10/01/2017  . Lobar pneumonia ([place]) 10/02/2017  . Abnormal CT scan, colon 11/23/2017  . Constipation 11/23/2017   Resolved Ambulatory Problems    Diagnosis Date Noted  . No Resolved Ambulatory Problems   Past Medical History:  Diagnosis Date  . CAD S/P percutaneous coronary angioplasty 1998  . Diabetes mellitus without complication ([place])   . Diverticulitis 08/16/2017  . GERD (gastroesophageal reflux disease)   . Headache   . Hx of CABG March 2017  . Hypercholesteremia   . Hypertension   . Neuropathy   . Peripheral vascular disease ([place])   . Pneumonia   . Tachycardia    Past Surgical History:  Procedure Laterality Date  . ABDOMINAL AORTOGRAM Bilateral 09/19/2016   Procedure: iliac;  Surgeon: Serafina Mitchell, MD;  Location: [facility];  Service: Cardiovascular;  Laterality: Bilateral;  . CARDIAC CATHETERIZATION  2003   with stent  . CARDIAC CATHETERIZATION N/A 10/11/2015   Procedure: Left Heart Cath and Coronary Angiography;  Surgeon: Peter M Martinique, MD;  Location: [facility];  Service: Cardiovascular;  Laterality: N/A;  . COLONOSCOPY N/A 09/22/2013   Procedure: COLONOSCOPY;  Surgeon: Daneil Dolin, MD;  [facility];  Service: Endoscopy;  Laterality: N/A;  9:30 AM  . CORONARY ARTERY BYPASS GRAFT N/A 10/18/2015   Procedure: CORONARY ARTERY BYPASS GRAFTING (CABG) x  five, using left internal mammary artery and right leg greater saphenous vein harvested endoscopically;  Surgeon: [name], MD;  Location: [facility];  Service: Open Heart Surgery;  Laterality: N/A;  . ENDARTERECTOMY FEMORAL Right 08/09/2016   Procedure: ENDARTERECTOMY FEMORAL WITH VEIN PATCH ANGIOPLASTY;  Surgeon: Serafina Mitchell, MD;  Location: [facility];  Service:  Vascular;  Laterality: Right;  . ESOPHAGOGASTRODUODENOSCOPY N/A 10/24/2017   Dr. Gala Romney: hiatal hernia  . FEMORAL-FEMORAL BYPASS GRAFT Bilateral 08/09/2016   Procedure: REVISION BYPASS GRAFT RIGHT FEMORAL-LEFT FEMORAL ARTERY;  Surgeon: Serafina Mitchell, MD;  Location: [facility];  Service: Vascular;  Laterality: Bilateral;  . FEMORAL-POPLITEAL BYPASS GRAFT    . PERIPHERAL VASCULAR CATHETERIZATION N/A 05/08/2016   Procedure: Lower Extremity Angiography;  Surgeon: Lorretta Harp, MD;  Location: [facility];  Service: Cardiovascular;  Laterality: N/A;  . PERIPHERAL VASCULAR INTERVENTION Right 09/19/2016   Procedure: Peripheral Vascular Intervention;  Surgeon: Serafina Mitchell, MD;  Location: [facility];  Service: Cardiovascular;  Laterality: Right;  ext iliac stent  . PROCTOSCOPY  10/24/2017   Procedure: PROCTOSCOPY;  Surgeon: Daneil Dolin, MD;  [facility];  Service: Endoscopy;;  . TEE WITHOUT CARDIOVERSION N/A 10/18/2015   Procedure: TRANSESOPHAGEAL ECHOCARDIOGRAM (TEE);  Surgeon: [name], MD;  Location: [facility];  Service: Open Heart Surgery;  Laterality: N/A;    NUTRITION - FOCUSED PHYSICAL EXAM: Limited physical exam today- mild buccal fat loss, moderate fat depletion triceps, moderate muscle loss deltoids and  clavicles.   Diet Order:  Diet clear liquid Room service appropriate? Yes; Fluid consistency: Thin  EDUCATION NEEDS:  Education needs have been addressed Skin:  Skin Assessment: Reviewed RN Assessment  Last BM:  4/24  Height:   Ht Readings from Last 1 Encounters:  12/06/17 5\' 6"  (1.676 m)    Weight:   Wt Readings from Last 1 Encounters:  12/06/17 144 lb 6.4 oz (65.5 kg)    Ideal Body Weight:  65 kg  BMI:  Body mass index is 23.31 kg/m.  Estimated Nutritional Needs:   Kcal:  4383-7793 (based on adj wt for CKD-3- 74 kg)  Protein:  59-67 gr protein (0.8-0.9 gr/kg)  Fluid:  >2 liters daily    Colman Cater MS,RD,CSG,LDN Office:  [phone] Pager: [phone]

## 2017-12-06 NOTE — ED Notes (Signed)
[name], lab in with pt to get labs.

## 2017-12-06 NOTE — Progress Notes (Signed)
Pt only received one run of K+. Pt stated he could not tolerate any more runs of K+. MD paged. Ordered to hold remaining runs of K+  Margaret Pyle, RN

## 2017-12-06 NOTE — ED Notes (Signed)
Dr. Manuella Ghazi in with the pt at this time.

## 2017-12-06 NOTE — ED Notes (Signed)
Pt ambulated from the waiting room to room 1 without difficulty.

## 2017-12-06 NOTE — H&P (Addendum)
History and Physical    [name] WJX:[id number] DOB: March 02, 1946 DOA: 12/05/2017  PCP: [name], MD   Patient coming from: Home  Chief Complaint: Rectal bleed  HPI: [name] is a 72 y.o. male with medical history significant for CAD with prior stents and CABG, hypertension, dyslipidemia, type 2 diabetes, peripheral vascular disease, GERD, and diverticulitis who presented to the ED after experiencing 2 large bloody bowel movements with bright red blood yesterday evening at approximately 8 PM.  He had a sudden urge to have a bowel movement and barely made it to the bathroom.  He is known to have an abnormal focal sigmoid colonic wall thickening and is due to follow-up with a general surgeon in [place] in early May.  He sees Dr. Gala Romney as his gastroenterologist and has had a recent EGD with no significant findings per the patient.  He states that at that time a colonoscopy was planned, but was canceled since the colon prep was not adequate. He denies any current abdominal pain, nausea, vomiting, or diarrhea. He denies lightheadedness, dizziness, diaphoresis, chest pain, palpitations, or dyspnea.   ED Course: Vital signs are noted to be stable and laboratory data reveals leukocytosis of 14,500, sodium 130, chloride 88, potassium 3.4, BUN 29, creatinine 2.06 with baseline of 1.5, glucose 392, and hemoglobin that is stable at 12.8.  CT of the abdomen and pelvis without contrast demonstrates focal sigmoid colonic wall thickening concerning for possible chronic abscess, giant diverticulum, or possible perforated neoplasm.  ED physician has spoken with GI Dr. Laural Golden who will see this patient in consultation and recommends observation.  Review of Systems: All others reviewed and otherwise negative.  Past Medical History:  Diagnosis Date  . CAD S/P percutaneous coronary angioplasty 1998   PCI TO CX  . Diabetes mellitus without complication ([place])   . Diverticulitis 08/16/2017   hospitalized with diverticulitis/sepsis  . GERD (gastroesophageal reflux disease)   . Headache   . 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 Hypercholesteremia   . Hypertension   . Neuropathy   . Peripheral vascular disease ([place])    s/p R-L FEM-FEM BYPASS  . Pneumonia   . Tachycardia    after CABG, pt on medicine for this    Past Surgical History:  Procedure Laterality Date  . ABDOMINAL AORTOGRAM Bilateral 09/19/2016   Procedure: iliac;  Surgeon: Serafina Mitchell, MD;  Location: [facility];  Service: Cardiovascular;  Laterality: Bilateral;  . CARDIAC CATHETERIZATION  2003   with stent  . CARDIAC CATHETERIZATION N/A 10/11/2015   Procedure: Left Heart Cath and Coronary Angiography;  Surgeon: Peter M Martinique, MD;  Location: [facility];  Service: Cardiovascular;  Laterality: N/A;  . COLONOSCOPY N/A 09/22/2013   Procedure: COLONOSCOPY;  Surgeon: Daneil Dolin, MD;  [facility];  Service: Endoscopy;  Laterality: N/A;  9:30 AM  . CORONARY ARTERY BYPASS GRAFT N/A 10/18/2015   Procedure: CORONARY ARTERY BYPASS GRAFTING (CABG) x  five, using left internal mammary artery and right leg greater saphenous vein harvested endoscopically;  Surgeon: [name], MD;  Location: [facility];  Service: Open Heart Surgery;  Laterality: N/A;  . ENDARTERECTOMY FEMORAL Right 08/09/2016   Procedure: ENDARTERECTOMY FEMORAL WITH VEIN PATCH ANGIOPLASTY;  Surgeon: Serafina Mitchell, MD;  Location: [facility];  Service: Vascular;  Laterality: Right;  . ESOPHAGOGASTRODUODENOSCOPY N/A 10/24/2017   Dr. Gala Romney: hiatal hernia  . FEMORAL-FEMORAL BYPASS GRAFT Bilateral 08/09/2016   Procedure:  REVISION BYPASS GRAFT RIGHT FEMORAL-LEFT FEMORAL ARTERY;  Surgeon: Serafina Mitchell, MD;  Location: [facility];  Service: Vascular;  Laterality: Bilateral;  . FEMORAL-POPLITEAL BYPASS GRAFT    . PERIPHERAL VASCULAR CATHETERIZATION N/A 05/08/2016   Procedure: Lower Extremity Angiography;  Surgeon: Lorretta Harp, MD;  Location: [facility];  Service: Cardiovascular;  Laterality: N/A;  . PERIPHERAL VASCULAR INTERVENTION Right 09/19/2016   Procedure: Peripheral Vascular Intervention;  Surgeon: Serafina Mitchell, MD;  Location: [facility];  Service: Cardiovascular;  Laterality: Right;  ext iliac stent  . PROCTOSCOPY  10/24/2017   Procedure: PROCTOSCOPY;  Surgeon: Daneil Dolin, MD;  [facility];  Service: Endoscopy;;  . TEE WITHOUT CARDIOVERSION N/A 10/18/2015   Procedure: TRANSESOPHAGEAL ECHOCARDIOGRAM (TEE);  Surgeon: [name], MD;  Location: [facility];  Service: Open Heart Surgery;  Laterality: N/A;     reports that he quit smoking about 17 years ago. His smoking use included cigarettes. He has a 60.00 pack-year smoking history. He has never used smokeless tobacco. He reports that he does not drink alcohol or use drugs.  Allergies  Allergen Reactions  . No Known Allergies     Family History  Problem Relation Age of Onset  . Heart attack Mother   . Stroke Mother   . Colon cancer Neg Hx     Prior to Admission medications   Medication Sig Start Date End Date Taking? Authorizing Provider  albuterol (PROVENTIL HFA;VENTOLIN HFA) 108 (90 Base) MCG/ACT inhaler Inhale 2 puffs into the lungs every 6 (six) hours as needed for wheezing or shortness of breath. 10/03/17   Kathie Dike, MD  aspirin 81 MG tablet Take 81 mg by mouth daily.    [provider]  Cholecalciferol (VITAMIN D) 2000 UNITS tablet Take 2,000 Units by mouth daily.    [provider]  cilostazol (PLETAL) 100 MG tablet Take 100 mg by mouth 2 (two) times daily.    [provider]  ferrous sulfate 325 (65 FE) MG tablet Take 325 mg by mouth daily.     [provider]  gabapentin (NEURONTIN) 300 MG capsule Take 300-600 mg by mouth 2 (two) times daily. 300mg  in the morning and 600mg  at bedtime    [provider]  guaiFENesin (MUCINEX) 600 MG 12 hr tablet Take 1 tablet (600 mg total) by  mouth 2 (two) times daily. 10/03/17 10/03/18  Kathie Dike, MD  insulin glargine (LANTUS) 100 UNIT/ML injection Inject 0.2 mLs (20 Units total) into the skin at bedtime. 08/03/17   Isaac Bliss, Rayford Halsted, MD  linaclotide [facility]) 145 MCG CAPS capsule Take 1 capsule (145 mcg total) by mouth daily before breakfast. 11/23/17   Mahala Menghini, PA-C  metoprolol tartrate (LOPRESSOR) 25 MG tablet Take 25 mg by mouth 2 (two) times daily.    [provider]  pantoprazole (PROTONIX) 40 MG tablet Take 1 tablet (40 mg total) by mouth daily. 08/19/17   Dhungel, Flonnie Overman, MD  simvastatin (ZOCOR) 80 MG tablet Take 40 mg by mouth at bedtime.     [provider]  vitamin B-12 (CYANOCOBALAMIN) 1000 MCG tablet Take 1,000 mcg by mouth daily.    [provider]    Physical Exam: Vitals:   12/06/17 0046 12/06/17 0130 12/06/17 0233 12/06/17 0300  BP: 139/66 135/72 138/69 135/73  Pulse: 76 70 79 77  Resp: 19 17 16 16   Temp:      TempSrc:      SpO2:  99% 100% 98% 96%  Weight:      Height:        Constitutional: NAD, calm, comfortable Vitals:   12/06/17 0046 12/06/17 0130 12/06/17 0233 12/06/17 0300  BP: 139/66 135/72 138/69 135/73  Pulse: 76 70 79 77  Resp: 19 17 16 16   Temp:      TempSrc:      SpO2: 99% 100% 98% 96%  Weight:      Height:       Eyes: lids and conjunctivae normal ENMT: Mucous membranes are moist.  Neck: normal, supple Respiratory: clear to auscultation bilaterally. Normal respiratory effort. No accessory muscle use.  Cardiovascular: Regular rate and rhythm, no murmurs. No extremity edema. Abdomen: no tenderness, no distention. Bowel sounds positive.  Musculoskeletal:  No joint deformity upper and lower extremities.   Skin: no rashes, lesions, ulcers.  Psychiatric: Normal judgment and insight. Alert and oriented x 3. Normal mood.   Labs on Admission: I have personally reviewed following labs and imaging studies  CBC: Recent Labs  Lab 12/05/17 2213    WBC 14.5*  HGB 12.8*  HCT 40.3  MCV 87.6  PLT 962*   Basic Metabolic Panel: Recent Labs  Lab 12/05/17 2213  NA 130*  K 3.4*  CL 88*  CO2 27  GLUCOSE 392*  BUN 29*  CREATININE 2.06*  CALCIUM 9.0   GFR: Estimated Creatinine Clearance: 30.1 mL/min (A) (by C-G formula based on SCr of 2.06 mg/dL (H)). Liver Function Tests: Recent Labs  Lab 12/05/17 2213  AST 13*  ALT 12*  ALKPHOS 89  BILITOT 0.6  PROT 6.9  ALBUMIN 3.3*   No results for input(s): LIPASE, AMYLASE in the last 168 hours. No results for input(s): AMMONIA in the last 168 hours. Coagulation Profile: No results for input(s): INR, PROTIME in the last 168 hours. Cardiac Enzymes: No results for input(s): CKTOTAL, CKMB, CKMBINDEX, TROPONINI in the last 168 hours. BNP (last 3 results) No results for input(s): PROBNP in the last 8760 hours. HbA1C: No results for input(s): HGBA1C in the last 72 hours. CBG: No results for input(s): GLUCAP in the last 168 hours. Lipid Profile: No results for input(s): CHOL, HDL, LDLCALC, TRIG, CHOLHDL, LDLDIRECT in the last 72 hours. Thyroid Function Tests: No results for input(s): TSH, T4TOTAL, FREET4, T3FREE, THYROIDAB in the last 72 hours. Anemia Panel: No results for input(s): VITAMINB12, FOLATE, FERRITIN, TIBC, IRON, RETICCTPCT in the last 72 hours. Urine analysis:    Component Value Date/Time   COLORURINE YELLOW 10/01/2017 1805   APPEARANCEUR CLEAR 10/01/2017 1805   LABSPEC 1.022 10/01/2017 1805   PHURINE 5.0 10/01/2017 1805   GLUCOSEU >=500 (A) 10/01/2017 1805   HGBUR SMALL (A) 10/01/2017 1805   BILIRUBINUR NEGATIVE 10/01/2017 1805   KETONESUR NEGATIVE 10/01/2017 1805   PROTEINUR NEGATIVE 10/01/2017 1805   UROBILINOGEN 0.2 03/16/2015 0120   NITRITE NEGATIVE 10/01/2017 1805   LEUKOCYTESUR NEGATIVE 10/01/2017 1805    Radiological Exams on Admission: Ct Abdomen Pelvis Wo Contrast  Result Date: 12/06/2017 CLINICAL DATA:  Bloody bowel movement. History of  diverticulitis, fem-fem bypass, fem-pop bypass. EXAM: CT ABDOMEN AND PELVIS WITHOUT CONTRAST TECHNIQUE: Multidetector CT imaging of the abdomen and pelvis was performed following the standard protocol without IV contrast. COMPARISON:  CT abdomen and pelvis November 26, 2017 and August 17, 2017 FINDINGS: LOWER CHEST: Lung bases are clear. The visualized heart size is normal. Severe coronary artery calcifications and/or stents. No pericardial effusion. HEPATOBILIARY: Normal. PANCREAS: Nonacute.  Atrophic. SPLEEN: Normal. ADRENALS/URINARY TRACT: Kidneys  are orthotopic, demonstrating normal size and morphology. Bilateral vascular calcifications. 4.1 cm benign-appearing RIGHT upper pole cyst. 1 cm cyst LEFT upper pole. Too small to characterize hypodensities bilateral kidneys. Focal scarring LEFT kidney. No nephrolithiasis, hydronephrosis; limited assessment for renal masses on this nonenhanced examination. The unopacified ureters are normal in course and caliber. Urinary bladder is well distended and unremarkable. Normal adrenal glands. STOMACH/BOWEL: Short segment of sigmoid wall thickening, large stool filled outpouching with similar surrounding fat stranding. Numerous tiny surrounding lymph nodes. Moderate sigmoid diverticulosis. Moderate amount of retained large bowel stool. Small amount of small bowel feces compatible with chronic stasis. The stomach, small bowel are normal in course and caliber without inflammatory changes, sensitivity decreased by lack of enteric contrast. Normal appendix. VASCULAR/LYMPHATIC: Aortoiliac vessels are normal in course and caliber. Severe calcific atherosclerosis. 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 No lymphadenopathy by CT size criteria. REPRODUCTIVE: Normal. OTHER: No intraperitoneal free fluid or free air. MUSCULOSKELETAL: Non-acute. Status post median sternotomy. Moderate RIGHT and small to moderate LEFT fat containing inguinal hernias. Surgical clips LEFT inguinal soft tissues  compatible with previous vascular access. Fem-fem bypass noted. Osteopenia. IMPRESSION: 1. Similar appearance of focal sigmoid colonic wall thickening and large stool filled inflamed outpouching. Differential diagnosis includes chronic abscess, giant diverticulum, chronically perforated neoplasm. Recommend direct inspection if not imaged during proctoscopy October 24, 2017. Aortic Atherosclerosis (ICD10-I70.0). Electronically Signed   By: Elon Alas M.D.   On: 12/06/2017 02:27    Assessment/Plan Principal Problem:   GI bleed Active Problems:   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   Essential hypertension   HLD (hyperlipidemia)   AKI (acute kidney injury) ([place])   Type 2 diabetes mellitus ([facility])   Hyponatremia    1. Rectal bleed.  This is in setting of abnormal focal sigmoid colonic wall thickening with recommendations for direct inspection per CT report.  Consult GI for further recommendations and hold home aspirin and Plavix for now.  Monitor H&H every 6 hours and transfuse if needed.  Keep n.p.o. for now and hold all home medications. 2. AKI.  Prior documentation of CKD, but patient has GFR of greater than 60 just two months ago. Baseline Cr of 1.5.  Place on gentle IV fluid, time-limited and monitor with repeat labs.  Avoid nephrotoxic agents. 3. Mild hypokalemia. Replete IV.  4. CAD with prior CABG.  Hold aspirin and Plavix on account of current bleed. 5. Dyslipidemia.  Hold statin currently. 6. Type 2 diabetes with hyperglycemia.  Maintain on half of home Lantus dose and SSI for coverage. 7. Hyponatremia.  Likely related to hyperglycemia to significant degree.  Will maintain on IV normal saline for now and monitor with repeat labs.   DVT prophylaxis: SCDs Code Status: Full Family Communication: Wife at bedside Disposition Plan:GI evaluation and observation for further bleeding Consults called:GI Dr. Laural Golden Admission status: Obs, med-surg   Lynlee Stratton Darleen Crocker DO [facility] Pager  [phone]  If 7PM-7AM, please contact night-coverage [email or web address] Password [email or web address]  12/06/2017, 4:05 AM

## 2017-12-06 NOTE — Telephone Encounter (Signed)
Pt's daughter Jerene Dilling) called office requesting for [facility]SL to call her. She thought her dad was going to be transferred to [facility] but hospitalist told them he couldn't be transferred unless it was an emergency. She is concerned about him having to stay at [facility]. She can be reached at [phone].

## 2017-12-06 NOTE — Consult Note (Addendum)
Referring Provider: Orson Eva, MD Primary Care Physician:  [name], MD Primary Gastroenterologist:  Garfield Cornea, MD  Reason for Consultation:  Fabienne Bruns bleed  HPI: [name] is a 72 y.o. male with h/o PVD, CAD, DM recently seen in our office for h/o abnormal colon on CT 08/2017. Treated for diverticulitis and initially there was concern for possible abscess but with repeat CT "reads" there was concern it could be a large diverticula vs an abscess communicating with the colon. IR felt it was too risky to drain especially since it could be colon instead of an abscess. Patient was treated with antibiotic therapy and plans for follow up colonoscopy to exclude underlying malignancy. Colonoscopy attempted in 10/2017 but prep was very poor.   When I saw him last week, he had fullness/tenderness in LLQ therefore CT was obtained to follow up on previous abnormalities seen. He had persistent 7cm size region at proximal sigmoid colon with mild surrounding inflammatory changes which could represent giant diverticulum containing stool OR chronic contained walled off perforation/abscess with communication with sigmoid lumen. Underlying malignancy not excluded. Discussed with Dr. Gala Romney, colonoscopy attempts not felt to be safe and advised for surgical consultation. Arrangements made for consult with Dr. Dalbert Batman at daughter's request, appointment scheduled for May 2nd.   Patient presented this morning with rectal bleeding. He had two episodes since last night, none since presenting to the hospital. Described strong urge to have BM, didn't make it in time, passed a large amount of liquid brown malodorous stool and large amount of fresh blood noted. No melena. Stomach has felt unsettled. No n/v.  CT A/P without contrast today showed similar appearance of focal sigmoid colonic wall thickening and large stool filled inflamed outpouching, DDX chronic abscess, giant diverticulum, chronically perforated neoplasm. Complains  of 10 pound weight loss in past few months. Appetite is poor. Multiple admissions in the past one year, for respiratory issues as well.   Patient is on ASA, and Pletal (?). He thought he was on Plavix. Gets his meds from the [place]. In 01/2017 his medication list switched from Plavix to Pletal per Epic.     Prior to Admission medications   Medication Sig Start Date End Date Taking? Authorizing Provider  albuterol (PROVENTIL HFA;VENTOLIN HFA) 108 (90 Base) MCG/ACT inhaler Inhale 2 puffs into the lungs every 6 (six) hours as needed for wheezing or shortness of breath. 10/03/17   Kathie Dike, MD  aspirin 81 MG tablet Take 81 mg by mouth daily.    [provider]  Cholecalciferol (VITAMIN D) 2000 UNITS tablet Take 2,000 Units by mouth daily.    [provider]  cilostazol (PLETAL) 100 MG tablet Take 100 mg by mouth 2 (two) times daily.    [provider]  ferrous sulfate 325 (65 FE) MG tablet Take 325 mg by mouth daily.     [provider]  gabapentin (NEURONTIN) 300 MG capsule Take 300-600 mg by mouth 2 (two) times daily. 300mg  in the morning and 600mg  at bedtime    [provider]  guaiFENesin (MUCINEX) 600 MG 12 hr tablet Take 1 tablet (600 mg total) by mouth 2 (two) times daily. 10/03/17 10/03/18  Kathie Dike, MD  insulin glargine (LANTUS) 100 UNIT/ML injection Inject 0.2 mLs (20 Units total) into the skin at bedtime. 08/03/17   Isaac Bliss, Rayford Halsted, MD  linaclotide [facility]) 145 MCG CAPS capsule Take 1 capsule (145 mcg total) by mouth daily before breakfast. 11/23/17   Mahala Menghini,  PA-C  metoprolol tartrate (LOPRESSOR) 25 MG tablet Take 25 mg by mouth 2 (two) times daily.    [provider]  pantoprazole (PROTONIX) 40 MG tablet Take 1 tablet (40 mg total) by mouth daily. 08/19/17   Dhungel, Flonnie Overman, MD  simvastatin (ZOCOR) 80 MG tablet Take 40 mg by mouth at bedtime.     [provider]  vitamin B-12 (CYANOCOBALAMIN) 1000 MCG  tablet Take 1,000 mcg by mouth daily.    [provider]    Current Facility-Administered Medications  Medication Dose Route Frequency Provider Last Rate Last Dose  . 0.9 %  sodium chloride infusion   Intravenous Continuous Heath Lark D, DO 75 mL/hr at 12/06/17 0521    . insulin aspart (novoLOG) injection 0-20 Units  0-20 Units Subcutaneous Q4H Shah, Pratik D, DO   7 Units at 12/06/17 0535  . insulin glargine (LANTUS) injection 10 Units  10 Units Subcutaneous QHS Manuella Ghazi, Pratik D, DO      . ondansetron (ZOFRAN) tablet 4 mg  4 mg Oral Q6H PRN Manuella Ghazi, Pratik D, DO       Or  . ondansetron (ZOFRAN) injection 4 mg  4 mg Intravenous Q6H PRN Manuella Ghazi, Pratik D, DO      . potassium chloride 10 mEq in 100 mL IVPB  10 mEq Intravenous Q1 Hr x 4 Shah, Pratik D, DO   Stopped at 12/06/17 1610    Allergies as of 12/05/2017 - Review Complete 12/05/2017  Allergen Reaction Noted  . No known allergies  08/08/2016    Past Medical History:  Diagnosis Date  . CAD S/P percutaneous coronary angioplasty 1998   PCI TO CX  . Diabetes mellitus without complication ([place])   . Diverticulitis 08/16/2017   hospitalized with diverticulitis/sepsis  . GERD (gastroesophageal reflux disease)   . Headache   . 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 Hypercholesteremia   . Hypertension   . Neuropathy   . Peripheral vascular disease ([place])    s/p R-L FEM-FEM BYPASS  . Pneumonia   . Tachycardia    after CABG, pt on medicine for this    Past Surgical History:  Procedure Laterality Date  . ABDOMINAL AORTOGRAM Bilateral 09/19/2016   Procedure: iliac;  Surgeon: Serafina Mitchell, MD;  Location: [place] [facility];  Service: Cardiovascular;  Laterality: Bilateral;  . CARDIAC CATHETERIZATION  2003   with stent  . CARDIAC CATHETERIZATION N/A 10/11/2015   Procedure: Left Heart Cath and Coronary Angiography;  Surgeon: Peter M Martinique, MD;  Location: [place] [facility];  Service: Cardiovascular;  Laterality: N/A;  . COLONOSCOPY N/A  09/22/2013   Procedure: COLONOSCOPY;  Surgeon: Daneil Dolin, MD;  [facility];  Service: Endoscopy;  Laterality: N/A;  9:30 AM  . CORONARY ARTERY BYPASS GRAFT N/A 10/18/2015   Procedure: CORONARY ARTERY BYPASS GRAFTING (CABG) x  five, using left internal mammary artery and right leg greater saphenous vein harvested endoscopically;  Surgeon: [name], MD;  Location: [facility];  Service: Open Heart Surgery;  Laterality: N/A;  . ENDARTERECTOMY FEMORAL Right 08/09/2016   Procedure: ENDARTERECTOMY FEMORAL WITH VEIN PATCH ANGIOPLASTY;  Surgeon: Serafina Mitchell, MD;  Location: [facility];  Service: Vascular;  Laterality: Right;  . ESOPHAGOGASTRODUODENOSCOPY N/A 10/24/2017   Dr. Gala Romney: hiatal hernia  . FEMORAL-FEMORAL BYPASS GRAFT Bilateral 08/09/2016   Procedure: REVISION BYPASS GRAFT RIGHT FEMORAL-LEFT FEMORAL ARTERY;  Surgeon: Serafina Mitchell, MD;  Location: [facility];  Service: Vascular;  Laterality: Bilateral;  . FEMORAL-POPLITEAL BYPASS GRAFT    . PERIPHERAL VASCULAR CATHETERIZATION N/A 05/08/2016   Procedure: Lower Extremity Angiography;  Surgeon: Lorretta Harp, MD;  Location: [place];  Service: Cardiovascular;  Laterality: N/A;  . PERIPHERAL VASCULAR INTERVENTION Right 09/19/2016   Procedure: Peripheral Vascular Intervention;  Surgeon: Serafina Mitchell, MD;  Location: [facility];  Service: Cardiovascular;  Laterality: Right;  ext iliac stent  . PROCTOSCOPY  10/24/2017   Procedure: PROCTOSCOPY;  Surgeon: Daneil Dolin, MD;  [facility];  Service: Endoscopy;;  . TEE WITHOUT CARDIOVERSION N/A 10/18/2015   Procedure: TRANSESOPHAGEAL ECHOCARDIOGRAM (TEE);  Surgeon: [name], MD;  Location: [facility];  Service: Open Heart Surgery;  Laterality: N/A;    Family History  Problem Relation Age of Onset  . Heart attack Mother   . Stroke Mother   . Colon cancer Neg Hx     Social History   Socioeconomic History  . Marital status: Married    Spouse name:  Not on file  . Number of children: Not on file  . Years of education: Not on file  . Highest education level: Not on file  Occupational History  . Not on file  Social Needs  . Financial resource strain: Not on file  . Food insecurity:    Worry: Not on file    Inability: Not on file  . Transportation needs:    Medical: Not on file    Non-medical: Not on file  Tobacco Use  . Smoking status: Former Smoker    Packs/day: 2.00    Years: 30.00    Pack years: 60.00    Types: Cigarettes    Last attempt to quit: 08/14/2000    Years since quitting: 17.3  . Smokeless tobacco: Never Used  Substance and Sexual Activity  . Alcohol use: No  . Drug use: No  . Sexual activity: Not on file  Lifestyle  . Physical activity:    Days per week: Not on file    Minutes per session: Not on file  . Stress: Not on file  Relationships  . Social connections:    Talks on phone: Not on file    Gets together: Not on file    Attends religious service: Not on file    Active member of club or organization: Not on file    Attends meetings of clubs or organizations: Not on file    Relationship status: Not on file  . Intimate partner violence:    Fear of current or ex partner: Not on file    Emotionally abused: Not on file    Physically abused: Not on file    Forced sexual activity: Not on file  Other Topics Concern  . Not on file  Social History Narrative  . Not on file     ROS:  General: Negative for  fever, chills, fatigue, weakness. See hpi Eyes: Negative for vision changes.  ENT: Negative for hoarseness, difficulty swallowing , nasal congestion. CV: Negative for chest pain, angina, palpitations, dyspnea on exertion, peripheral edema.  Respiratory: Negative for dyspnea at rest, dyspnea on exertion, cough, sputum, wheezing.  GI: See history of present illness. GU:  Negative for dysuria, hematuria, urinary incontinence, urinary frequency, nocturnal urination.  MS: Negative for joint pain, low  back pain.  Derm: Negative for rash or itching.  Neuro: Negative for weakness, abnormal sensation, seizure, frequent headaches, memory loss, confusion.  Psych: Negative for anxiety, depression, suicidal ideation,  hallucinations.  Endo: see hpi Heme: Negative for bruising or bleeding. Allergy: Negative for rash or hives.       Physical Examination: Vital signs in last 24 hours: Temp:  [98.3 F (36.8 C)-99.1 F (37.3 C)] 98.3 F (36.8 C) (04/25 0603) Pulse Rate:  [70-86] 70 (04/25 0615) Resp:  [9-19] 9 (04/25 0615) BP: (118-164)/(64-92) 144/64 (04/25 0615) SpO2:  [93 %-100 %] 98 % (04/25 0615) Weight:  [144 lb 6.4 oz (65.5 kg)-147 lb (66.7 kg)] 144 lb 6.4 oz (65.5 kg) (04/25 0603) Last BM Date: 12/05/17  General: Well-nourished, well-developed in no acute distress.  Head: Normocephalic, atraumatic.   Eyes: Conjunctiva pink, no icterus. Mouth: Oropharyngeal mucosa moist and pink , no lesions erythema or exudate. Neck: Supple without thyromegaly, masses, or lymphadenopathy.  Lungs: Clear to auscultation bilaterally.  Heart: Regular rate and rhythm, no murmurs rubs or gallops.  Abdomen: Bowel sounds are normal, mild LLQ tenderness/fullness, no abdominal bruits or    hernia , no rebound or guarding.   Rectal: not performed Extremities: No lower extremity edema, clubbing, deformity.  Neuro: Alert and oriented x 4 , grossly normal neurologically.  Skin: Warm and dry, no rash or jaundice.   Psych: Alert and cooperative, normal mood and affect.        Intake/Output from previous day: 04/24 0701 - 04/25 0700 In: 48.8 [I.V.:48.8] Out: -  Intake/Output this shift: No intake/output data recorded.  Lab Results: CBC Recent Labs    12/05/17 2213 12/06/17 0543  WBC 14.5*  --   HGB 12.8* 12.3*  HCT 40.3 38.3*  MCV 87.6  --   PLT 401*  --    BMET Recent Labs    12/05/17 2213  NA 130*  K 3.4*  CL 88*  CO2 27  GLUCOSE 392*  BUN 29*  CREATININE 2.06*  CALCIUM 9.0    LFT Recent Labs    12/05/17 2213  BILITOT 0.6  ALKPHOS 89  AST 13*  ALT 12*  PROT 6.9  ALBUMIN 3.3*    Lipase No results for input(s): LIPASE in the last 72 hours.  PT/INR No results for input(s): LABPROT, INR in the last 72 hours.    Imaging Studies: Ct Abdomen Pelvis Wo Contrast  Result Date: 12/06/2017 CLINICAL DATA:  Bloody bowel movement. History of diverticulitis, fem-fem bypass, fem-pop bypass. EXAM: CT ABDOMEN AND PELVIS WITHOUT CONTRAST TECHNIQUE: Multidetector CT imaging of the abdomen and pelvis was performed following the standard protocol without IV contrast. COMPARISON:  CT abdomen and pelvis November 26, 2017 and August 17, 2017 FINDINGS: LOWER CHEST: Lung bases are clear. The visualized heart size is normal. Severe coronary artery calcifications and/or stents. No pericardial effusion. HEPATOBILIARY: Normal. PANCREAS: Nonacute.  Atrophic. SPLEEN: Normal. ADRENALS/URINARY TRACT: Kidneys are orthotopic, demonstrating normal size and morphology. Bilateral vascular calcifications. 4.1 cm benign-appearing RIGHT upper pole cyst. 1 cm cyst LEFT upper pole. Too small to characterize hypodensities bilateral kidneys. Focal scarring LEFT kidney. No nephrolithiasis, hydronephrosis; limited assessment for renal masses on this nonenhanced examination. The unopacified ureters are normal in course and caliber. Urinary bladder is well distended and unremarkable. Normal adrenal glands. STOMACH/BOWEL: Short segment of sigmoid wall thickening, large stool filled outpouching with similar surrounding fat stranding. Numerous tiny surrounding lymph nodes. Moderate sigmoid diverticulosis. Moderate amount of retained large bowel stool. Small amount of small bowel feces compatible with chronic stasis. The stomach, small bowel are normal in course and caliber without inflammatory changes, sensitivity decreased by lack of enteric contrast. Normal appendix. VASCULAR/LYMPHATIC:  Aortoiliac vessels are normal  in course and caliber. Severe calcific atherosclerosis. 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 No lymphadenopathy by CT size criteria. REPRODUCTIVE: Normal. OTHER: No intraperitoneal free fluid or free air. MUSCULOSKELETAL: Non-acute. Status post median sternotomy. Moderate RIGHT and small to moderate LEFT fat containing inguinal hernias. Surgical clips LEFT inguinal soft tissues compatible with previous vascular access. Fem-fem bypass noted. Osteopenia. IMPRESSION: 1. Similar appearance of focal sigmoid colonic wall thickening and large stool filled inflamed outpouching. Differential diagnosis includes chronic abscess, giant diverticulum, chronically perforated neoplasm. Recommend direct inspection if not imaged during proctoscopy October 24, 2017. Aortic Atherosclerosis (ICD10-I70.0). Electronically Signed   By: Elon Alas M.D.   On: 12/06/2017 02:27   Ct Chest Wo Contrast  Result Date: 11/27/2017 CLINICAL DATA:  Follow-up lung nodule, right-sided chest pain following fall 2 days ago, initial encounter EXAM: CT CHEST WITHOUT CONTRAST TECHNIQUE: Multidetector CT imaging of the chest was performed following the standard protocol without IV contrast. COMPARISON:  08/17/2017, 12/05/2016, 10/13/2015 FINDINGS: Cardiovascular: Atherosclerotic changes of the aorta are noted. Changes of prior coronary bypass grafting are seen. No cardiac enlargement is seen. No pericardial fluid is noted. Mediastinum/Nodes: Thoracic inlet is unremarkable. No hilar or mediastinal adenopathy is seen. The esophagus is unremarkable. Lungs/Pleura: Lungs are well aerated bilaterally. Scattered areas of ground-glass density are identified throughout the right lung. These are new from the prior exam. These likely represent postinflammatory change. Previously seen nodule in the right middle lobe is stable in appearance when compared with previous exams dating back to 12/05/2016. No other significant nodules are noted. Upper Abdomen: Visualized  upper abdomen demonstrates right renal cyst stable from the previous exam. Musculoskeletal: Degenerative changes of the thoracic spine are noted. No acute rib abnormality is noted. IMPRESSION: Previously seen right middle lobe nodular changes are stable from previous exam dating back to 10/13/2015 Scattered ground-glass densities are noted throughout the right lung likely postinflammatory in nature and improved from previous exams. No new focal infiltrate is seen. No acute rib abnormality is noted. Aortic Atherosclerosis (ICD10-I70.0). Electronically Signed   By: Inez Catalina M.D.   On: 11/27/2017 12:41   Ct Abdomen Pelvis W Contrast  Result Date: 11/26/2017 CLINICAL DATA:  Left lower quadrant abdominal fullness. EXAM: CT ABDOMEN AND PELVIS WITH CONTRAST TECHNIQUE: Multidetector CT imaging of the abdomen and pelvis was performed using the standard protocol following bolus administration of intravenous contrast. CONTRAST:  53mL ISOVUE-300 IOPAMIDOL (ISOVUE-300) INJECTION 61% COMPARISON:  08/17/2017 PET CT 11/30/2015.  CT abdomen 02/02/2005. FINDINGS: Lower chest: Normal Hepatobiliary: Normal Pancreas: Normal Spleen: Normal Adrenals/Urinary Tract: Adrenal glands are normal. Mild renal atrophy, left more than right. Bilateral renal cysts, the largest on the right measuring up to 4.5 cm in diameter. Extensive renal vascular calcification. Stomach/Bowel: Large amount of fecal matter in the colon. As seen previously, the patient has chronic diverticulosis throughout the sigmoid colon. At the proximal sigmoid, there is an approximately 7 cm region containing stool, in communication with the main lumen of the sigmoid colon, with mild surrounding inflammatory change. This could represent a giant diverticulum containing stool or could represent a chronic contained walled-off perforation an abscess in wide communication with sigmoid lumen. Vascular/Lymphatic: Aortic atherosclerosis. No aneurysm. Advanced aortic branch  vessel atherosclerosis. Right to left femoral to femoral bypass. Reproductive: Normal Other: No free fluid or air. Musculoskeletal: Negative IMPRESSION: The area of clinical concern corresponds to an approximately 7 cm in diameter abnormality arising from the sigmoid colon which contains stool and contrast, apparently in wide communication  with the sigmoid colon. The differential diagnosis is giant diverticulum versus chronic walled-off communicating cavity related to previous diverticulitis and abscess. Patient does have extensive diverticulosis of the sigmoid colon in that region without definite acute diverticulitis. The finding is similar to previous scans going back as far as April of 2017. Advanced aortic atherosclerosis and atherosclerosis of the branch vessels. Electronically Signed   By: Nelson Chimes M.D.   On: 11/26/2017 14:24  [4 week]   Impression: 72 y/o male with h/o abnormal sigmoid colon, ?giant diverticulum vs chronic walled off communicating cavity related to abscess vs underlying malignancy on multiple CTs since 08/2017 who presents with large volume hematochezia which started within the last 12-24 hours. No bleeding since presentation to the hospital. Hgb remains stable. Patient also with moderate amount of retained stool, cannot exclude stricture/partial obstruction in this area. No upstream dilation however.   Plan as been for general surgery consultation for possible exploratory surgery with segmental resection. Discussed with Dr. Laural Golden. Given patient is at risk of bleeding, likely with stricture in this area, cannot rule out underlying malignancy at this point, he recommends patient be transferred to [facility] for surgical evaluation/intervention. Patient's desires are for surgery with Dr. Dalbert Batman, he has appointment for consultation on 12/13/17 but at this point inpatient evaluation advised.   Plan: 1. Continue antibiotic coverage for now. 2. D/C [place].  3. Inpatient transfer to  [facility] recommended for surgical intervention. Patient aware.  Discussed with Dr. Carles Collet.   We would like to thank you for the opportunity to participate in the care of [facility]  Laureen Ochs. Bernarda Caffey [facility] [phone] 4/25/201910:07 AM  Dr. Laural Golden acting as supervising physician in absence of Dr. Gala Romney and Dr. Oneida Alar on 4/25 and 12/07/17.      LOS: 0 days     Addendum: Discussed with Dr. Carles Collet who spoke with Dr. Windle Guard (with [facility]) advises patient cannot be transferred unless emergent surgery needed. Discussed with Dr. Laural Golden. As of right now he is not in need of emergent surgery but could become emergent issue. Advises stay on antibiotics. Monitor for recurrent bleeding, I would recommend at least for 24 hours. Plan for outpatient surgery evaluation on 12/13/17 unless something changes.   Laureen Ochs. Bernarda Caffey [facility] [phone] 4/25/201912:36 PM

## 2017-12-06 NOTE — Care Management Obs Status (Signed)
[facility] NOTIFICATION   Patient Details  Name: [name] MRN: [id number] Date of Birth: [birth year]   Medicare Observation Status Notification Given:  Yes    Julius Matus, Chauncey Reading, RN 12/06/2017, 1:47 PM

## 2017-12-07 DIAGNOSIS — I10 Essential (primary) hypertension: Secondary | ICD-10-CM | POA: Diagnosis not present

## 2017-12-07 DIAGNOSIS — E871 Hypo-osmolality and hyponatremia: Secondary | ICD-10-CM | POA: Diagnosis not present

## 2017-12-07 DIAGNOSIS — E1165 Type 2 diabetes mellitus with hyperglycemia: Secondary | ICD-10-CM | POA: Diagnosis not present

## 2017-12-07 DIAGNOSIS — N183 Chronic kidney disease, stage 3 (moderate): Secondary | ICD-10-CM | POA: Diagnosis not present

## 2017-12-07 DIAGNOSIS — K5733 Diverticulitis of large intestine without perforation or abscess with bleeding: Secondary | ICD-10-CM | POA: Diagnosis not present

## 2017-12-07 DIAGNOSIS — E440 Moderate protein-calorie malnutrition: Secondary | ICD-10-CM | POA: Diagnosis not present

## 2017-12-07 DIAGNOSIS — N179 Acute kidney failure, unspecified: Secondary | ICD-10-CM | POA: Diagnosis not present

## 2017-12-07 DIAGNOSIS — Z794 Long term (current) use of insulin: Secondary | ICD-10-CM | POA: Diagnosis not present

## 2017-12-07 LAB — BASIC METABOLIC PANEL
Anion gap: 11 (ref 5–15)
BUN: 28 mg/dL — AB (ref 6–20)
CHLORIDE: 98 mmol/L — AB (ref 101–111)
CO2: 28 mmol/L (ref 22–32)
CREATININE: 1.17 mg/dL (ref 0.61–1.24)
Calcium: 8.7 mg/dL — ABNORMAL LOW (ref 8.9–10.3)
GFR calc Af Amer: >60 mL/min (ref >60)
GFR calc non Af Amer: >60 mL/min (ref >60)
Glucose, Bld: 80 mg/dL (ref 65–99)
POTASSIUM: 3.9 mmol/L (ref 3.5–5.1)
Sodium: 137 mmol/L (ref 135–145)

## 2017-12-07 LAB — CBC
HEMATOCRIT: 37.3 % — AB (ref 39.0–52.0)
Hemoglobin: 11.7 g/dL — ABNORMAL LOW (ref 13.0–17.0)
MCH: 27.5 pg (ref 26.0–34.0)
MCHC: 31.4 g/dL (ref 30.0–36.0)
MCV: 87.6 fL (ref 78.0–100.0)
PLATELETS: 347 10*3/uL (ref 150–400)
RBC: 4.26 MIL/uL (ref 4.22–5.81)
RDW: 13.9 % (ref 11.5–15.5)
WBC: 8.5 10*3/uL (ref 4.0–10.5)

## 2017-12-07 LAB — GLUCOSE, CAPILLARY
Glucose-Capillary: 72 mg/dL (ref 65–99)
Glucose-Capillary: 136 mg/dL — ABNORMAL HIGH (ref 65–99)
Glucose-Capillary: 235 mg/dL — ABNORMAL HIGH (ref 65–99)

## 2017-12-07 MED ORDER — CIPROFLOXACIN HCL 500 MG PO TABS: 500.0000 mg | ORAL_TABLET | Freq: Two times a day (BID) | ORAL | 0 refills | Status: DC

## 2017-12-07 MED ORDER — METRONIDAZOLE 500 MG PO TABS
500.0000 mg | ORAL_TABLET | Freq: Three times a day (TID) | ORAL | Status: DC
  Administered 2017-12-07: 500 mg via ORAL
  Filled 2017-12-07: qty 1

## 2017-12-07 MED ORDER — CIPROFLOXACIN HCL 250 MG PO TABS
500.0000 mg | ORAL_TABLET | Freq: Two times a day (BID) | ORAL | Status: DC
  Administered 2017-12-07: 500 mg via ORAL
  Filled 2017-12-07: qty 2

## 2017-12-07 MED ORDER — METRONIDAZOLE 500 MG PO TABS: 500.0000 mg | ORAL_TABLET | Freq: Three times a day (TID) | ORAL | 0 refills | Status: DC

## 2017-12-07 MED ORDER — INSULIN ASPART 100 UNIT/ML ~~LOC~~ SOLN: 0.0000 [IU] | Freq: Every day | SUBCUTANEOUS | Status: DC

## 2017-12-07 MED ORDER — INSULIN ASPART 100 UNIT/ML ~~LOC~~ SOLN
0.0000 [IU] | Freq: Three times a day (TID) | SUBCUTANEOUS | Status: DC
  Administered 2017-12-07: 3 [IU] via SUBCUTANEOUS

## 2017-12-07 NOTE — Progress Notes (Addendum)
Inpatient Diabetes Program Recommendations  AACE/ADA: New Consensus Statement on Inpatient Glycemic Control (2015)  Target Ranges:  Prepandial:   less than 140 mg/dL      Peak postprandial:   less than 180 mg/dL (1-2 hours)      Critically ill patients:  140 - 180 mg/dL   Results for [name], [name] (MRN [id number]) as of 12/07/2017 08:37  Ref. Range 12/06/2017 05:26 12/06/2017 07:32 12/06/2017 10:51 12/06/2017 16:51 12/06/2017 19:35 12/06/2017 23:42 12/07/2017 04:16 12/07/2017 07:18  Glucose-Capillary Latest Ref Range: 65 - 99 mg/dL 212 (H)  Novolog 7 units 157 (H)  Novolog 4 units 149 (H)  Novolog 3 units 228 (H)  Novolog 7 units 250 (H)  Novolog 7 units  Lantus 10 units @ 21:51 223 (H)  Novolog 7 units  72 136 (H)  Novolog 3 units  Results for [name], [name] (MRN [id number]) as of 12/07/2017 08:37  Ref. Range 05/31/2017 15:53 12/06/2017 05:43  Hemoglobin A1C Latest Ref Range: 4.8 - 5.6 % 9.8 (H) 9.3 (H)   Review of Glycemic Control  Diabetes history: DM2 Outpatient Diabetes medications: Lantus 25 units QHS Current orders for Inpatient glycemic control: Lantus 10 units QHS, Novolog 0-20 units Q4H  Inpatient Diabetes Program Recommendations: Insulin-Basal: Noted glucose of 72 mg/dl at 4:16 am today which is likely due to Novolog correction. Do not recommend decreasing Lantus at this time. Correction (SSI): Please consider decreasing Novolog correction scale to Novolog 0-15 units Q4H. A1C: Current A1C noted to be slightly improved from prior A1C but still above goal A1C of less than 7%. A1C 9.3% on 12/06/17 indicating an average glucose of 220 mg/dl over the past 2-3 months. Patient needs to follow up with PCP regarding DM control.  Thanks, Barnie Alderman, RN, MSN, CDE Diabetes Coordinator Inpatient Diabetes Program [phone] (Team Pager from 8am to 5pm)

## 2017-12-07 NOTE — Progress Notes (Signed)
Patient examined in ICU. No more bleeding; hgb improved; no GI issues. Recommended/emphasized to keep Surgical appointment in [place] next week. Follow-up with GI as outpatient. Ok to discharge from GI perspective.   Thank you for allowing [place] to participate in the care of [name], DNP, AGNP-C Adult & Gerontological Nurse Practitioner [facility]

## 2017-12-07 NOTE — Discharge Summary (Signed)
Physician Discharge Summary  NIHAR KLUS IDC:[id number] DOB: 06/09/1946 DOA: 12/05/2017  PCP: [name], MD  Admit date: 12/05/2017 Discharge date: 12/07/2017  Admitted From: Home Disposition:  Home  Recommendations for Outpatient Follow-up:  1. Follow up with PCP in 1-2 weeks 2. Please obtain BMP/CBC in one week    Discharge Condition: Stable CODE STATUS:FULL Diet recommendation: Heart Healthy / Carb Modified   Brief/Interim Summary: 72 year old male with a history of coronary artery disease status post CABG, hypertension, hyperlipidemia, diabetes mellitus, peripheral vascular disease presenting with hematochezia at 8 PM on 12/05/2017.  Patient had 2 large bloody bowel movements which prompted him to seek medical care.  Prior to this, the patient stated he had had intermittent small amounts of hematochezia without any melena.  He denied any fevers, chills, abdominal pain, nausea, vomiting, hematemesis.  Patient denies any NSAIDs.  There is no dysuria or hematuria.  Upon presentation, the patient was noted to have WBC 14.5 with hemoglobin 12.8 and serum creatinine 2.06 which is above his usual baseline.  GI was consulted to assist with management.    Discharge Diagnoses:  Hematochezia -Suspect diverticular bleed -12/06/2017 CT abdomen--short segment sigmoid wall thickening unchanged; large stool-filled outpouching with fat stranding in the sigmoid colon -GI consult appreciated--repeat try at colonoscopy if rebleeds, otherwise can go home if Hgb stable and no more bleeding -spoke with [facility] [place] [facility], Dr. Myer Haff urgency for transfer or surgical intervention--declined transfer -Hgb stable--11.7 on day of d/c -diet advanced which pt tolerated  Abdominal pain/Diverticulitis -Concerned about diverticulitis with abdominal pain in LLQ and leukocytosis -start empiric cipro and flagyl--home with 7 more days -improving  Acute on chronic renal failure--CKD stage  III -Baseline creatinine 1.2-1.5 -presenting creatinine 2.05 -due to volume depletion -Continue IV fluids -serum creatinine 1.17 on day of d/c  Hyponatremia -due to volume depletion -am BMP -IVF-->improved  Coronary artery disease -No chest pain presently -holding ASA due to GIB during hospitalization -restart metoprolol tartrate  Diabetes mellitus type 2 -Continue reduced dose Lantus -Decrease NovoLog sliding scale to moderate scale -05/31/2017 hemoglobin A1c 9 point -Repeat hemoglobin A1c  Hyperlipidemia -Continue statin  Essential hypertension -Continue metoprolol tartrate  Hypokalemia -repleted  Moderate malntrition -continue supplements      Discharge Instructions   Allergies as of 12/07/2017      Reactions   No Known Allergies       Medication List    TAKE these medications   albuterol 108 (90 Base) MCG/ACT inhaler Commonly known as:  PROVENTIL HFA;VENTOLIN HFA Inhale 2 puffs into the lungs every 6 (six) hours as needed for wheezing or shortness of breath.   aspirin 81 MG tablet Take 81 mg by mouth daily.   cilostazol 100 MG tablet Commonly known as:  PLETAL Take 100 mg by mouth 2 (two) times daily.   ciprofloxacin 500 MG tablet Commonly known as:  CIPRO Take 1 tablet (500 mg total) by mouth 2 (two) times daily.   ferrous sulfate 325 (65 FE) MG tablet Take 325 mg by mouth daily.   gabapentin 300 MG capsule Commonly known as:  NEURONTIN Take 300-600 mg by mouth 2 (two) times daily. 300mg  in the morning and 600mg  at bedtime   guaiFENesin 600 MG 12 hr tablet Commonly known as:  MUCINEX Take 1 tablet (600 mg total) by mouth 2 (two) times daily.   insulin glargine 100 UNIT/ML injection Commonly known as:  LANTUS Inject 0.2 mLs (20 Units total) into the skin at bedtime. What changed:  how much to take   linaclotide 145 MCG Caps capsule Commonly known as:  LINZESS Take 1 capsule (145 mcg total) by mouth daily before breakfast.     metoprolol tartrate 25 MG tablet Commonly known as:  LOPRESSOR Take 25 mg by mouth 2 (two) times daily.   metroNIDAZOLE 500 MG tablet Commonly known as:  FLAGYL Take 1 tablet (500 mg total) by mouth every 8 (eight) hours.   pantoprazole 40 MG tablet Commonly known as:  PROTONIX Take 1 tablet (40 mg total) by mouth daily.   simvastatin 80 MG tablet Commonly known as:  ZOCOR Take 40 mg by mouth at bedtime.   vitamin B-12 1000 MCG tablet Commonly known as:  CYANOCOBALAMIN Take 1,000 mcg by mouth daily.   Vitamin D 2000 units tablet Take 2,000 Units by mouth daily.       Allergies  Allergen Reactions  . No Known Allergies     Consultations:  GI   Procedures/Studies: Ct Abdomen Pelvis Wo Contrast  Result Date: 12/06/2017 CLINICAL DATA:  Bloody bowel movement. History of diverticulitis, fem-fem bypass, fem-pop bypass. EXAM: CT ABDOMEN AND PELVIS WITHOUT CONTRAST TECHNIQUE: Multidetector CT imaging of the abdomen and pelvis was performed following the standard protocol without IV contrast. COMPARISON:  CT abdomen and pelvis November 26, 2017 and August 17, 2017 FINDINGS: LOWER CHEST: Lung bases are clear. The visualized heart size is normal. Severe coronary artery calcifications and/or stents. No pericardial effusion. HEPATOBILIARY: Normal. PANCREAS: Nonacute.  Atrophic. SPLEEN: Normal. ADRENALS/URINARY TRACT: Kidneys are orthotopic, demonstrating normal size and morphology. Bilateral vascular calcifications. 4.1 cm benign-appearing RIGHT upper pole cyst. 1 cm cyst LEFT upper pole. Too small to characterize hypodensities bilateral kidneys. Focal scarring LEFT kidney. No nephrolithiasis, hydronephrosis; limited assessment for renal masses on this nonenhanced examination. The unopacified ureters are normal in course and caliber. Urinary bladder is well distended and unremarkable. Normal adrenal glands. STOMACH/BOWEL: Short segment of sigmoid wall thickening, large stool filled  outpouching with similar surrounding fat stranding. Numerous tiny surrounding lymph nodes. Moderate sigmoid diverticulosis. Moderate amount of retained large bowel stool. Small amount of small bowel feces compatible with chronic stasis. The stomach, small bowel are normal in course and caliber without inflammatory changes, sensitivity decreased by lack of enteric contrast. Normal appendix. VASCULAR/LYMPHATIC: Aortoiliac vessels are normal in course and caliber. Severe calcific atherosclerosis. 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 No lymphadenopathy by CT size criteria. REPRODUCTIVE: Normal. OTHER: No intraperitoneal free fluid or free air. MUSCULOSKELETAL: Non-acute. Status post median sternotomy. Moderate RIGHT and small to moderate LEFT fat containing inguinal hernias. Surgical clips LEFT inguinal soft tissues compatible with previous vascular access. Fem-fem bypass noted. Osteopenia. IMPRESSION: 1. Similar appearance of focal sigmoid colonic wall thickening and large stool filled inflamed outpouching. Differential diagnosis includes chronic abscess, giant diverticulum, chronically perforated neoplasm. Recommend direct inspection if not imaged during proctoscopy October 24, 2017. Aortic Atherosclerosis (ICD10-I70.0). Electronically Signed   By: Elon Alas M.D.   On: 12/06/2017 02:27   Ct Chest Wo Contrast  Result Date: 11/27/2017 CLINICAL DATA:  Follow-up lung nodule, right-sided chest pain following fall 2 days ago, initial encounter EXAM: CT CHEST WITHOUT CONTRAST TECHNIQUE: Multidetector CT imaging of the chest was performed following the standard protocol without IV contrast. COMPARISON:  08/17/2017, 12/05/2016, 10/13/2015 FINDINGS: Cardiovascular: Atherosclerotic changes of the aorta are noted. Changes of prior coronary bypass grafting are seen. No cardiac enlargement is seen. No pericardial fluid is noted. Mediastinum/Nodes: Thoracic inlet is unremarkable. No hilar or mediastinal adenopathy is seen.  The  esophagus is unremarkable. Lungs/Pleura: Lungs are well aerated bilaterally. Scattered areas of ground-glass density are identified throughout the right lung. These are new from the prior exam. These likely represent postinflammatory change. Previously seen nodule in the right middle lobe is stable in appearance when compared with previous exams dating back to 12/05/2016. No other significant nodules are noted. Upper Abdomen: Visualized upper abdomen demonstrates right renal cyst stable from the previous exam. Musculoskeletal: Degenerative changes of the thoracic spine are noted. No acute rib abnormality is noted. IMPRESSION: Previously seen right middle lobe nodular changes are stable from previous exam dating back to 10/13/2015 Scattered ground-glass densities are noted throughout the right lung likely postinflammatory in nature and improved from previous exams. No new focal infiltrate is seen. No acute rib abnormality is noted. Aortic Atherosclerosis (ICD10-I70.0). Electronically Signed   By: Inez Catalina M.D.   On: 11/27/2017 12:41   Ct Abdomen Pelvis W Contrast  Result Date: 11/26/2017 CLINICAL DATA:  Left lower quadrant abdominal fullness. EXAM: CT ABDOMEN AND PELVIS WITH CONTRAST TECHNIQUE: Multidetector CT imaging of the abdomen and pelvis was performed using the standard protocol following bolus administration of intravenous contrast. CONTRAST:  2mL ISOVUE-300 IOPAMIDOL (ISOVUE-300) INJECTION 61% COMPARISON:  08/17/2017 PET CT 11/30/2015.  CT abdomen 02/02/2005. FINDINGS: Lower chest: Normal Hepatobiliary: Normal Pancreas: Normal Spleen: Normal Adrenals/Urinary Tract: Adrenal glands are normal. Mild renal atrophy, left more than right. Bilateral renal cysts, the largest on the right measuring up to 4.5 cm in diameter. Extensive renal vascular calcification. Stomach/Bowel: Large amount of fecal matter in the colon. As seen previously, the patient has chronic diverticulosis throughout the sigmoid  colon. At the proximal sigmoid, there is an approximately 7 cm region containing stool, in communication with the main lumen of the sigmoid colon, with mild surrounding inflammatory change. This could represent a giant diverticulum containing stool or could represent a chronic contained walled-off perforation an abscess in wide communication with sigmoid lumen. Vascular/Lymphatic: Aortic atherosclerosis. No aneurysm. Advanced aortic branch vessel atherosclerosis. Right to left femoral to femoral bypass. Reproductive: Normal Other: No free fluid or air. Musculoskeletal: Negative IMPRESSION: The area of clinical concern corresponds to an approximately 7 cm in diameter abnormality arising from the sigmoid colon which contains stool and contrast, apparently in wide communication with the sigmoid colon. The differential diagnosis is giant diverticulum versus chronic walled-off communicating cavity related to previous diverticulitis and abscess. Patient does have extensive diverticulosis of the sigmoid colon in that region without definite acute diverticulitis. The finding is similar to previous scans going back as far as April of 2017. Advanced aortic atherosclerosis and atherosclerosis of the branch vessels. Electronically Signed   By: Nelson Chimes M.D.   On: 11/26/2017 14:24        Discharge Exam: Vitals:   12/07/17 0621 12/07/17 0942  BP: 120/67 (!) 139/57  Pulse: 66 71  Resp: 15   Temp: 98.5 F (36.9 C)   SpO2: 91%    Vitals:   12/07/17 0300 12/07/17 0400 12/07/17 0621 12/07/17 0942  BP: 120/80 129/68 120/67 (!) 139/57  Pulse: 68 68 66 71  Resp: 14 17 15    Temp:   98.5 F (36.9 C)   TempSrc:   Oral   SpO2: 94% 90% 91%   Weight:      Height:        General: Pt is alert, awake, not in acute distress Cardiovascular: RRR, S1/S2 +, no rubs, no gallops Respiratory: CTA bilaterally, no wheezing, no rhonchi Abdominal: Soft, NT, ND,  bowel sounds + Extremities: no edema, no cyanosis   The  results of significant diagnostics from this hospitalization (including imaging, microbiology, ancillary and laboratory) are listed below for reference.    Significant Diagnostic Studies: Ct Abdomen Pelvis Wo Contrast  Result Date: 12/06/2017 CLINICAL DATA:  Bloody bowel movement. History of diverticulitis, fem-fem bypass, fem-pop bypass. EXAM: CT ABDOMEN AND PELVIS WITHOUT CONTRAST TECHNIQUE: Multidetector CT imaging of the abdomen and pelvis was performed following the standard protocol without IV contrast. COMPARISON:  CT abdomen and pelvis November 26, 2017 and August 17, 2017 FINDINGS: LOWER CHEST: Lung bases are clear. The visualized heart size is normal. Severe coronary artery calcifications and/or stents. No pericardial effusion. HEPATOBILIARY: Normal. PANCREAS: Nonacute.  Atrophic. SPLEEN: Normal. ADRENALS/URINARY TRACT: Kidneys are orthotopic, demonstrating normal size and morphology. Bilateral vascular calcifications. 4.1 cm benign-appearing RIGHT upper pole cyst. 1 cm cyst LEFT upper pole. Too small to characterize hypodensities bilateral kidneys. Focal scarring LEFT kidney. No nephrolithiasis, hydronephrosis; limited assessment for renal masses on this nonenhanced examination. The unopacified ureters are normal in course and caliber. Urinary bladder is well distended and unremarkable. Normal adrenal glands. STOMACH/BOWEL: Short segment of sigmoid wall thickening, large stool filled outpouching with similar surrounding fat stranding. Numerous tiny surrounding lymph nodes. Moderate sigmoid diverticulosis. Moderate amount of retained large bowel stool. Small amount of small bowel feces compatible with chronic stasis. The stomach, small bowel are normal in course and caliber without inflammatory changes, sensitivity decreased by lack of enteric contrast. Normal appendix. VASCULAR/LYMPHATIC: Aortoiliac vessels are normal in course and caliber. Severe calcific atherosclerosis. Old infrarenal aortic  dissection. No lymphadenopathy by CT size criteria. REPRODUCTIVE: Normal. OTHER: No intraperitoneal free fluid or free air. MUSCULOSKELETAL: Non-acute. Status post median sternotomy. Moderate RIGHT and small to moderate LEFT fat containing inguinal hernias. Surgical clips LEFT inguinal soft tissues compatible with previous vascular access. Fem-fem bypass noted. Osteopenia. IMPRESSION: 1. Similar appearance of focal sigmoid colonic wall thickening and large stool filled inflamed outpouching. Differential diagnosis includes chronic abscess, giant diverticulum, chronically perforated neoplasm. Recommend direct inspection if not imaged during proctoscopy October 24, 2017. Aortic Atherosclerosis (ICD10-I70.0). Electronically Signed   By: Elon Alas M.D.   On: 12/06/2017 02:27   Ct Chest Wo Contrast  Result Date: 11/27/2017 CLINICAL DATA:  Follow-up lung nodule, right-sided chest pain following fall 2 days ago, initial encounter EXAM: CT CHEST WITHOUT CONTRAST TECHNIQUE: Multidetector CT imaging of the chest was performed following the standard protocol without IV contrast. COMPARISON:  08/17/2017, 12/05/2016, 10/13/2015 FINDINGS: Cardiovascular: Atherosclerotic changes of the aorta are noted. Changes of prior coronary bypass grafting are seen. No cardiac enlargement is seen. No pericardial fluid is noted. Mediastinum/Nodes: Thoracic inlet is unremarkable. No hilar or mediastinal adenopathy is seen. The esophagus is unremarkable. Lungs/Pleura: Lungs are well aerated bilaterally. Scattered areas of ground-glass density are identified throughout the right lung. These are new from the prior exam. These likely represent postinflammatory change. Previously seen nodule in the right middle lobe is stable in appearance when compared with previous exams dating back to 12/05/2016. No other significant nodules are noted. Upper Abdomen: Visualized upper abdomen demonstrates right renal cyst stable from the previous exam.  Musculoskeletal: Degenerative changes of the thoracic spine are noted. No acute rib abnormality is noted. IMPRESSION: Previously seen right middle lobe nodular changes are stable from previous exam dating back to 10/13/2015 Scattered ground-glass densities are noted throughout the right lung likely postinflammatory in nature and improved from previous exams. No new focal infiltrate is seen. No acute  rib abnormality is noted. Aortic Atherosclerosis (ICD10-I70.0). Electronically Signed   By: Inez Catalina M.D.   On: 11/27/2017 12:41   Ct Abdomen Pelvis W Contrast  Result Date: 11/26/2017 CLINICAL DATA:  Left lower quadrant abdominal fullness. EXAM: CT ABDOMEN AND PELVIS WITH CONTRAST TECHNIQUE: Multidetector CT imaging of the abdomen and pelvis was performed using the standard protocol following bolus administration of intravenous contrast. CONTRAST:  45mL ISOVUE-300 IOPAMIDOL (ISOVUE-300) INJECTION 61% COMPARISON:  08/17/2017 PET CT 11/30/2015.  CT abdomen 02/02/2005. FINDINGS: Lower chest: Normal Hepatobiliary: Normal Pancreas: Normal Spleen: Normal Adrenals/Urinary Tract: Adrenal glands are normal. Mild renal atrophy, left more than right. Bilateral renal cysts, the largest on the right measuring up to 4.5 cm in diameter. Extensive renal vascular calcification. Stomach/Bowel: Large amount of fecal matter in the colon. As seen previously, the patient has chronic diverticulosis throughout the sigmoid colon. At the proximal sigmoid, there is an approximately 7 cm region containing stool, in communication with the main lumen of the sigmoid colon, with mild surrounding inflammatory change. This could represent a giant diverticulum containing stool or could represent a chronic contained walled-off perforation an abscess in wide communication with sigmoid lumen. Vascular/Lymphatic: Aortic atherosclerosis. No aneurysm. Advanced aortic branch vessel atherosclerosis. Right to left femoral to femoral bypass. Reproductive:  Normal Other: No free fluid or air. Musculoskeletal: Negative IMPRESSION: The area of clinical concern corresponds to an approximately 7 cm in diameter abnormality arising from the sigmoid colon which contains stool and contrast, apparently in wide communication with the sigmoid colon. The differential diagnosis is giant diverticulum versus chronic walled-off communicating cavity related to previous diverticulitis and abscess. Patient does have extensive diverticulosis of the sigmoid colon in that region without definite acute diverticulitis. The finding is similar to previous scans going back as far as April of 2017. Advanced aortic atherosclerosis and atherosclerosis of the branch vessels. Electronically Signed   By: Nelson Chimes M.D.   On: 11/26/2017 14:24     Microbiology: Recent Results (from the past 240 hour(s))  MRSA PCR Screening     Status: None   Collection Time: 12/06/17  5:57 AM  Result Value Ref Range Status   MRSA by PCR NEGATIVE NEGATIVE Final    Comment:        The GeneXpert MRSA Assay (FDA approved for NASAL specimens only), is one component of a comprehensive MRSA colonization surveillance program. It is not intended to diagnose MRSA infection nor to guide or monitor treatment for MRSA infections. Performed at [facility], [address], [place], [place] [address]      Labs: Basic Metabolic Panel: Recent Labs  Lab 12/05/17 2213 12/06/17 0838 12/07/17 0413  NA 130* 133* 137  K 3.4* 3.9 3.9  CL 88* 90* 98*  CO2 27 28 28   GLUCOSE 392* 133* 80  BUN 29* 27* 28*  CREATININE 2.06* 1.45* 1.17  CALCIUM 9.0 9.2 8.7*   Liver Function Tests: Recent Labs  Lab 12/05/17 2213  AST 13*  ALT 12*  ALKPHOS 89  BILITOT 0.6  PROT 6.9  ALBUMIN 3.3*   No results for input(s): LIPASE, AMYLASE in the last 168 hours. No results for input(s): AMMONIA in the last 168 hours. CBC: Recent Labs  Lab 12/05/17 2213 12/06/17 0543 12/06/17 0838 12/06/17 1659 12/06/17 2152  12/07/17 0413  WBC 14.5*  --  9.2  --   --  8.5  HGB 12.8* 12.3* 12.8* 11.0* 11.2* 11.7*  HCT 40.3 38.3* 39.5 34.4* 34.9* 37.3*  MCV 87.6  --  86.8  --   --  87.6  PLT 401*  --  386  --   --  347   Cardiac Enzymes: No results for input(s): CKTOTAL, CKMB, CKMBINDEX, TROPONINI in the last 168 hours. BNP: Invalid input(s): POCBNP CBG: Recent Labs  Lab 12/06/17 1651 12/06/17 1935 12/06/17 2342 12/07/17 0416 12/07/17 0718  GLUCAP 228* 250* 223* 72 136*    Time coordinating discharge:  36 minutes  Signed:  Orson Eva, DO [facility] Pager: [phone] 12/07/2017, 9:54 AM

## 2017-12-07 NOTE — Progress Notes (Signed)
Patient alert and oriented x4. No complaints of pain, shortness of breath, chest pain, dizziness, nausea or vomiting. Patient up out of bed independently and ambulating with steady gait. Patient tolerated PO meds and diet well. Discharge instructions and education gone over with patient and wife. Both expressed full understanding of instructions and education along with follow up appointments with providers locations, times and dates. Patient discharged home with all belongings with wife via car. Patient understands to pick up prescriptions at [facility] in [place].

## 2017-12-12 ENCOUNTER — Inpatient Hospital Stay (HOSPITAL_COMMUNITY): Payer: Medicare Other

## 2017-12-12 ENCOUNTER — Encounter (HOSPITAL_COMMUNITY): Payer: Self-pay

## 2017-12-12 ENCOUNTER — Telehealth: Payer: Self-pay | Admitting: Internal Medicine

## 2017-12-12 ENCOUNTER — Other Ambulatory Visit: Payer: Self-pay

## 2017-12-12 ENCOUNTER — Telehealth: Payer: Self-pay | Admitting: *Deleted

## 2017-12-12 ENCOUNTER — Inpatient Hospital Stay (HOSPITAL_COMMUNITY)
Admission: EM | Admit: 2017-12-12 | Discharge: 2017-12-17 | DRG: 854 | Disposition: A | Discharge status: Home Health | Payer: Medicare Other | Attending: Internal Medicine | Admitting: Internal Medicine

## 2017-12-12 DIAGNOSIS — R55 Syncope and collapse: Secondary | ICD-10-CM

## 2017-12-12 DIAGNOSIS — K219 Gastro-esophageal reflux disease without esophagitis: Secondary | ICD-10-CM | POA: Diagnosis present

## 2017-12-12 DIAGNOSIS — N183 Chronic kidney disease, stage 3 unspecified: Secondary | ICD-10-CM | POA: Diagnosis present

## 2017-12-12 DIAGNOSIS — I129 Hypertensive chronic kidney disease with stage 1 through stage 4 chronic kidney disease, or unspecified chronic kidney disease: Secondary | ICD-10-CM | POA: Diagnosis present

## 2017-12-12 DIAGNOSIS — K639 Disease of intestine, unspecified: Secondary | ICD-10-CM | POA: Diagnosis not present

## 2017-12-12 DIAGNOSIS — K5732 Diverticulitis of large intestine without perforation or abscess without bleeding: Secondary | ICD-10-CM | POA: Diagnosis not present

## 2017-12-12 DIAGNOSIS — Z01810 Encounter for preprocedural cardiovascular examination: Secondary | ICD-10-CM

## 2017-12-12 DIAGNOSIS — Z7982 Long term (current) use of aspirin: Secondary | ICD-10-CM

## 2017-12-12 DIAGNOSIS — Z9861 Coronary angioplasty status: Secondary | ICD-10-CM | POA: Diagnosis not present

## 2017-12-12 DIAGNOSIS — Z955 Presence of coronary angioplasty implant and graft: Secondary | ICD-10-CM

## 2017-12-12 DIAGNOSIS — I10 Essential (primary) hypertension: Secondary | ICD-10-CM | POA: Diagnosis not present

## 2017-12-12 DIAGNOSIS — E8729 Other acidosis: Secondary | ICD-10-CM | POA: Insufficient documentation

## 2017-12-12 DIAGNOSIS — K6389 Other specified diseases of intestine: Secondary | ICD-10-CM | POA: Diagnosis not present

## 2017-12-12 DIAGNOSIS — E876 Hypokalemia: Secondary | ICD-10-CM | POA: Diagnosis present

## 2017-12-12 DIAGNOSIS — A419 Sepsis, unspecified organism: Principal | ICD-10-CM | POA: Diagnosis present

## 2017-12-12 DIAGNOSIS — G629 Polyneuropathy, unspecified: Secondary | ICD-10-CM | POA: Diagnosis not present

## 2017-12-12 DIAGNOSIS — N189 Chronic kidney disease, unspecified: Secondary | ICD-10-CM

## 2017-12-12 DIAGNOSIS — K921 Melena: Secondary | ICD-10-CM | POA: Diagnosis present

## 2017-12-12 DIAGNOSIS — E1151 Type 2 diabetes mellitus with diabetic peripheral angiopathy without gangrene: Secondary | ICD-10-CM | POA: Diagnosis not present

## 2017-12-12 DIAGNOSIS — K5792 Diverticulitis of intestine, part unspecified, without perforation or abscess without bleeding: Secondary | ICD-10-CM | POA: Diagnosis not present

## 2017-12-12 DIAGNOSIS — K429 Umbilical hernia without obstruction or gangrene: Secondary | ICD-10-CM | POA: Diagnosis present

## 2017-12-12 DIAGNOSIS — Z951 Presence of aortocoronary bypass graft: Secondary | ICD-10-CM

## 2017-12-12 DIAGNOSIS — K5730 Diverticulosis of large intestine without perforation or abscess without bleeding: Secondary | ICD-10-CM | POA: Diagnosis not present

## 2017-12-12 DIAGNOSIS — Z933 Colostomy status: Secondary | ICD-10-CM | POA: Diagnosis not present

## 2017-12-12 DIAGNOSIS — Z79899 Other long term (current) drug therapy: Secondary | ICD-10-CM

## 2017-12-12 DIAGNOSIS — E11649 Type 2 diabetes mellitus with hypoglycemia without coma: Secondary | ICD-10-CM | POA: Diagnosis not present

## 2017-12-12 DIAGNOSIS — Z87891 Personal history of nicotine dependence: Secondary | ICD-10-CM

## 2017-12-12 DIAGNOSIS — K6289 Other specified diseases of anus and rectum: Secondary | ICD-10-CM | POA: Diagnosis not present

## 2017-12-12 DIAGNOSIS — K659 Peritonitis, unspecified: Secondary | ICD-10-CM | POA: Diagnosis not present

## 2017-12-12 DIAGNOSIS — I2510 Atherosclerotic heart disease of native coronary artery without angina pectoris: Secondary | ICD-10-CM | POA: Diagnosis present

## 2017-12-12 DIAGNOSIS — E119 Type 2 diabetes mellitus without complications: Secondary | ICD-10-CM

## 2017-12-12 DIAGNOSIS — I70209 Unspecified atherosclerosis of native arteries of extremities, unspecified extremity: Secondary | ICD-10-CM | POA: Diagnosis present

## 2017-12-12 DIAGNOSIS — N200 Calculus of kidney: Secondary | ICD-10-CM | POA: Diagnosis not present

## 2017-12-12 DIAGNOSIS — E785 Hyperlipidemia, unspecified: Secondary | ICD-10-CM | POA: Diagnosis present

## 2017-12-12 DIAGNOSIS — E1140 Type 2 diabetes mellitus with diabetic neuropathy, unspecified: Secondary | ICD-10-CM | POA: Diagnosis present

## 2017-12-12 DIAGNOSIS — Z792 Long term (current) use of antibiotics: Secondary | ICD-10-CM

## 2017-12-12 DIAGNOSIS — R402413 Glasgow coma scale score 13-15, at hospital admission: Secondary | ICD-10-CM | POA: Diagnosis present

## 2017-12-12 DIAGNOSIS — E1122 Type 2 diabetes mellitus with diabetic chronic kidney disease: Secondary | ICD-10-CM | POA: Diagnosis present

## 2017-12-12 DIAGNOSIS — Z794 Long term (current) use of insulin: Secondary | ICD-10-CM

## 2017-12-12 DIAGNOSIS — N281 Cyst of kidney, acquired: Secondary | ICD-10-CM | POA: Diagnosis not present

## 2017-12-12 DIAGNOSIS — K5720 Diverticulitis of large intestine with perforation and abscess without bleeding: Secondary | ICD-10-CM | POA: Diagnosis not present

## 2017-12-12 DIAGNOSIS — Z95820 Peripheral vascular angioplasty status with implants and grafts: Secondary | ICD-10-CM

## 2017-12-12 DIAGNOSIS — R7989 Other specified abnormal findings of blood chemistry: Secondary | ICD-10-CM

## 2017-12-12 DIAGNOSIS — E872 Acidosis: Secondary | ICD-10-CM | POA: Insufficient documentation

## 2017-12-12 DIAGNOSIS — R627 Adult failure to thrive: Secondary | ICD-10-CM | POA: Diagnosis present

## 2017-12-12 DIAGNOSIS — E7800 Pure hypercholesterolemia, unspecified: Secondary | ICD-10-CM | POA: Diagnosis present

## 2017-12-12 DIAGNOSIS — D72829 Elevated white blood cell count, unspecified: Secondary | ICD-10-CM

## 2017-12-12 DIAGNOSIS — K650 Generalized (acute) peritonitis: Secondary | ICD-10-CM | POA: Diagnosis not present

## 2017-12-12 DIAGNOSIS — Z95828 Presence of other vascular implants and grafts: Secondary | ICD-10-CM

## 2017-12-12 HISTORY — DX: Chronic kidney disease, stage 3 (moderate): N18.3

## 2017-12-12 HISTORY — DX: Chronic kidney disease, stage 3 unspecified: N18.30

## 2017-12-12 LAB — CBC WITH DIFFERENTIAL/PLATELET
BASOS ABS: 0 10*3/uL (ref 0.0–0.1)
BLASTS: 0 %
Band Neutrophils: 33 %
Basophils Relative: 0 %
EOS PCT: 0 %
Eosinophils Absolute: 0 10*3/uL (ref 0.0–0.7)
HEMATOCRIT: 37.8 % — AB (ref 39.0–52.0)
HEMOGLOBIN: 12.2 g/dL — AB (ref 13.0–17.0)
Lymphocytes Relative: 4 %
Lymphs Abs: 1 10*3/uL (ref 0.7–4.0)
MCH: 28.3 pg (ref 26.0–34.0)
MCHC: 32.3 g/dL (ref 30.0–36.0)
MCV: 87.7 fL (ref 78.0–100.0)
MYELOCYTES: 0 %
Metamyelocytes Relative: 0 %
Monocytes Absolute: 0 10*3/uL — ABNORMAL LOW (ref 0.1–1.0)
Monocytes Relative: 0 %
NEUTROS PCT: 63 %
NRBC: 0 /100{WBCs}
Neutro Abs: 24.3 10*3/uL — ABNORMAL HIGH (ref 1.7–7.7)
Other: 0 %
Platelets: 403 10*3/uL — ABNORMAL HIGH (ref 150–400)
Promyelocytes Relative: 0 %
RBC: 4.31 MIL/uL (ref 4.22–5.81)
RDW: 14.6 % (ref 11.5–15.5)
WBC Morphology: INCREASED
WBC: 25.3 10*3/uL — AB (ref 4.0–10.5)

## 2017-12-12 LAB — COMPREHENSIVE METABOLIC PANEL
ALT: 15 U/L — ABNORMAL LOW (ref 17–63)
AST: 18 U/L (ref 15–41)
Albumin: 3.1 g/dL — ABNORMAL LOW (ref 3.5–5.0)
Alkaline Phosphatase: 86 U/L (ref 38–126)
Anion gap: 15 (ref 5–15)
BILIRUBIN TOTAL: 0.4 mg/dL (ref 0.3–1.2)
BUN: 21 mg/dL — AB (ref 6–20)
CO2: 25 mmol/L (ref 22–32)
CREATININE: 1.37 mg/dL — AB (ref 0.61–1.24)
Calcium: 9 mg/dL (ref 8.9–10.3)
Chloride: 97 mmol/L — ABNORMAL LOW (ref 101–111)
GFR, EST AFRICAN AMERICAN: 59 mL/min — AB (ref >60)
GFR, EST NON AFRICAN AMERICAN: 51 mL/min — AB (ref >60)
Glucose, Bld: 120 mg/dL — ABNORMAL HIGH (ref 65–99)
POTASSIUM: 3 mmol/L — AB (ref 3.5–5.1)
Sodium: 137 mmol/L (ref 135–145)
TOTAL PROTEIN: 6.3 g/dL — AB (ref 6.5–8.1)

## 2017-12-12 LAB — URINALYSIS, ROUTINE W REFLEX MICROSCOPIC
BILIRUBIN URINE: NEGATIVE
HGB URINE DIPSTICK: NEGATIVE
KETONES UR: 5 mg/dL — AB
LEUKOCYTES UA: NEGATIVE
NITRITE: NEGATIVE
Protein, ur: NEGATIVE mg/dL
Specific Gravity, Urine: 1.017 (ref 1.005–1.030)
pH: 5 (ref 5.0–8.0)

## 2017-12-12 LAB — I-STAT CG4 LACTIC ACID, ED
Lactic Acid, Venous: 2.22 mmol/L (ref 0.5–1.9)
Lactic Acid, Venous: 2.08 mmol/L (ref 0.5–1.9)

## 2017-12-12 LAB — CBG MONITORING, ED
Glucose-Capillary: 136 mg/dL — ABNORMAL HIGH (ref 65–99)
Glucose-Capillary: 83 mg/dL (ref 65–99)

## 2017-12-12 LAB — SEDIMENTATION RATE: SED RATE: 28 mm/h — AB (ref 0–16)

## 2017-12-12 MED ORDER — ACETAMINOPHEN 325 MG PO TABS
650.0000 mg | ORAL_TABLET | Freq: Once | ORAL | Status: AC
  Administered 2017-12-12: 650 mg via ORAL
  Filled 2017-12-12: qty 2

## 2017-12-12 MED ORDER — VANCOMYCIN HCL IN DEXTROSE 1-5 GM/200ML-% IV SOLN
1000.0000 mg | Freq: Once | INTRAVENOUS | Status: AC
  Administered 2017-12-12: 1000 mg via INTRAVENOUS
  Filled 2017-12-12: qty 200

## 2017-12-12 MED ORDER — CILOSTAZOL 100 MG PO TABS
100.0000 mg | ORAL_TABLET | Freq: Two times a day (BID) | ORAL | Status: DC
  Administered 2017-12-12 – 2017-12-17 (×10): 100 mg via ORAL
  Filled 2017-12-12 (×2): qty 1
  Filled 2017-12-12: qty 2
  Filled 2017-12-12 (×9): qty 1

## 2017-12-12 MED ORDER — POTASSIUM CHLORIDE IN NACL 20-0.9 MEQ/L-% IV SOLN
INTRAVENOUS | Status: DC
  Administered 2017-12-12 – 2017-12-13 (×2): via INTRAVENOUS
  Filled 2017-12-12 (×3): qty 1000

## 2017-12-12 MED ORDER — IOHEXOL 300 MG/ML  SOLN: 100.0000 mL | Freq: Once | INTRAMUSCULAR | Status: DC | PRN

## 2017-12-12 MED ORDER — INSULIN ASPART 100 UNIT/ML ~~LOC~~ SOLN
0.0000 [IU] | SUBCUTANEOUS | Status: DC
  Administered 2017-12-12 – 2017-12-13 (×2): 1 [IU] via SUBCUTANEOUS
  Administered 2017-12-16 (×2): 2 [IU] via SUBCUTANEOUS
  Administered 2017-12-17: 1 [IU] via SUBCUTANEOUS
  Administered 2017-12-17: 2 [IU] via SUBCUTANEOUS
  Administered 2017-12-17: 3 [IU] via SUBCUTANEOUS
  Filled 2017-12-12: qty 1

## 2017-12-12 MED ORDER — ONDANSETRON HCL 4 MG/2ML IJ SOLN: 4.0000 mg | Freq: Four times a day (QID) | INTRAMUSCULAR | Status: DC | PRN

## 2017-12-12 MED ORDER — VANCOMYCIN HCL 500 MG IV SOLR
500.0000 mg | Freq: Two times a day (BID) | INTRAVENOUS | Status: DC
  Filled 2017-12-12: qty 500

## 2017-12-12 MED ORDER — SODIUM CHLORIDE 0.9 % IV SOLN
2.0000 g | INTRAVENOUS | Status: DC
  Filled 2017-12-12: qty 2

## 2017-12-12 MED ORDER — ENOXAPARIN SODIUM 40 MG/0.4ML ~~LOC~~ SOLN: 40.0000 mg | SUBCUTANEOUS | Status: DC

## 2017-12-12 MED ORDER — MORPHINE SULFATE (PF) 4 MG/ML IV SOLN
1.0000 mg | INTRAVENOUS | Status: DC | PRN
  Administered 2017-12-13 – 2017-12-14 (×4): 4 mg via INTRAVENOUS
  Filled 2017-12-12 (×4): qty 1

## 2017-12-12 MED ORDER — ACETAMINOPHEN 650 MG RE SUPP: 650.0000 mg | Freq: Four times a day (QID) | RECTAL | Status: DC | PRN

## 2017-12-12 MED ORDER — SODIUM CHLORIDE 0.9 % IV BOLUS
1000.0000 mL | Freq: Once | INTRAVENOUS | Status: AC
  Administered 2017-12-12: 1000 mL via INTRAVENOUS

## 2017-12-12 MED ORDER — PIPERACILLIN-TAZOBACTAM 3.375 G IVPB 30 MIN
3.3750 g | Freq: Once | INTRAVENOUS | Status: AC
  Administered 2017-12-12: 3.375 g via INTRAVENOUS
  Filled 2017-12-12: qty 50

## 2017-12-12 MED ORDER — ACETAMINOPHEN 325 MG PO TABS: 650.0000 mg | ORAL_TABLET | Freq: Four times a day (QID) | ORAL | Status: DC | PRN

## 2017-12-12 MED ORDER — METOPROLOL TARTRATE 5 MG/5ML IV SOLN: 5.0000 mg | Freq: Three times a day (TID) | INTRAVENOUS | Status: DC

## 2017-12-12 MED ORDER — PIPERACILLIN-TAZOBACTAM 3.375 G IVPB
3.3750 g | Freq: Three times a day (TID) | INTRAVENOUS | Status: DC
  Administered 2017-12-13 – 2017-12-17 (×13): 3.375 g via INTRAVENOUS
  Filled 2017-12-12 (×17): qty 50

## 2017-12-12 MED ORDER — ALBUTEROL SULFATE (2.5 MG/3ML) 0.083% IN NEBU: 2.5000 mg | INHALATION_SOLUTION | Freq: Four times a day (QID) | RESPIRATORY_TRACT | Status: DC | PRN

## 2017-12-12 MED ORDER — POTASSIUM CHLORIDE 10 MEQ/100ML IV SOLN
10.0000 meq | INTRAVENOUS | Status: AC
  Administered 2017-12-12 (×2): 10 meq via INTRAVENOUS
  Filled 2017-12-12 (×2): qty 100

## 2017-12-12 MED ORDER — POLYETHYLENE GLYCOL 3350 17 GM/SCOOP PO POWD
1.0000 | Freq: Once | ORAL | Status: DC
  Filled 2017-12-12: qty 255

## 2017-12-12 MED ORDER — ONDANSETRON HCL 4 MG PO TABS: 4.0000 mg | ORAL_TABLET | Freq: Four times a day (QID) | ORAL | Status: DC | PRN

## 2017-12-12 MED ORDER — METOPROLOL TARTRATE 5 MG/5ML IV SOLN
5.0000 mg | Freq: Three times a day (TID) | INTRAVENOUS | Status: DC
  Administered 2017-12-12 – 2017-12-16 (×11): 5 mg via INTRAVENOUS
  Filled 2017-12-12 (×11): qty 5

## 2017-12-12 MED ORDER — SODIUM CHLORIDE 0.9% FLUSH
3.0000 mL | Freq: Two times a day (BID) | INTRAVENOUS | Status: DC
  Administered 2017-12-12 – 2017-12-16 (×9): 3 mL via INTRAVENOUS

## 2017-12-12 MED ORDER — INSULIN GLARGINE 100 UNIT/ML ~~LOC~~ SOLN
20.0000 [IU] | Freq: Every day | SUBCUTANEOUS | Status: DC
  Administered 2017-12-13 – 2017-12-14 (×3): 20 [IU] via SUBCUTANEOUS
  Filled 2017-12-12 (×4): qty 0.2

## 2017-12-12 NOTE — ED Notes (Addendum)
Pt and family informed to provide urine specimen; urinal provided at bedside; pt and family verbalized understanding.  Pt did however mention experiencing difficulty urinating; RN informed.

## 2017-12-12 NOTE — H&P (Addendum)
History and Physical    [name] QAS:[id number] DOB: 02-07-1946 DOA: 12/12/2017  **Will admit patient based on the expectation that the patient will need hospitalization/ hospital care that crosses at least 2 midnights  PCP: [name], MD   Attending physician: Lorin Mercy  Patient coming from/Resides with: Private residence/wife  Chief Complaint: Abdominal pain and generalized weakness  HPI: [name] is a 72 y.o. male with medical history significant for CAD/prior CABG, diabetes on insulin, hypertension, stage III chronic kidney disease, dyslipidemia.  Patient has been experiencing ongoing abdominal pain since January 2019.  CT abdomen and pelvis done in January the gas and stool-filled thick-walled collection in the left lower quadrant measuring 5.9 cm in diameter which communicated with the sigmoid colon and appeared consistent with a diverticular abscess although perforated colon cancer was also considered in the differential.  Did follow-up with gastroenterology/[facility] colonoscopy was attempted in March but was aborted due to inadequate prep.  Patient underwent a second CT the abdomen on 11/26/2017 which demonstrated this area had increased to 7 cm in diameter arising of the sigmoid colon containing stool and contrast in differential included giant diverticulum vs chronic walled off communicating cavity related to prior diverticulitis and abscess.  He was also noted with extensive diverticulosis to the sigmoid colon.  Outpatient GI had given consideration to possible repeat attempt at colonoscopy but due to concerns over unintended perforation colonoscopy was not rescheduled and recommendation was for outpatient surgical consultation.    Patient was admitted to the hospital and subsequently discharged on 4/26 after admission for hematochezia and acute diverticulitis.  CT abdomen and pelvis completed during that admission with similar findings of focal sigmoid colonic wall thickening and  large stool filled inflamed outpouching with similar differential.  Patient was discharged home on Cipro and Flagyl to continue for 7 more days.  Of note patient had outpatient surgical consultation with [facility] group plan for 12/13/2017.  Unfortunately since discharge patient has had no improvement in symptoms.  He has poor oral intake.  Since onset of symptoms in January he is lost about 20 pounds.  In the past 24 hours he is developed fevers and chills.  He reports that when he passes a bowel movement it is very large in size, black and he has rectal pain with defecation.  Labs in the ER revealed an elevated lactic acid of 2.22.  White count elevated at 25,300 neutrophils 63% but absolute neutrophils 4.3%.  General surgery has been consulted.  Due to multiple medical problems general surgery has asked that internal medicine admit the patient.  They plan to consult cardiology for surgical clearance.  ED Course:  Vital Signs: BP 115/64   Pulse 94   Temp 99.9 F (37.7 C) (Oral)   Resp 16   Ht _0  (1.676 m)   Wt 65.3 kg (144 lb)   SpO2 99%   BMI 23.24 kg/m  Lab data: Sodium 137, potassium 3.0, chloride 97, CO2 25, glucose 120, BUN 21, creatinine 1.37, LFTs normal, lactic acid as above, CBC as above, hemoglobin 12.2, platelets 403,000; cultures obtained in the ER Medications and treatments: Tylenol 650 mg x 1, Zosyn 3.375 g IV x1, normal saline bolus x1 L, potassium 10 mEq IV x1, vancomycin IV x1  Review of Systems:  In addition to the HPI above,  No Headache, changes with Vision or hearing, new weakness, tingling, numbness in any extremity, dizziness, dysarthria or word finding difficulty, gait disturbance or imbalance, tremors or seizure activity  No problems swallowing food or Liquids, indigestion/reflux, choking or coughing while eating, abdominal pain with or after eating No Chest pain, Cough or Shortness of Breath, palpitations, orthopnea or DOE No N/V, melena,hematochezia No dysuria,  malodorous urine, hematuria or flank pain No new skin rashes, lesions, masses or bruises, No new joint pains, aches, swelling or redness No recent unintentional weight loss No polyuria, polydypsia or polyphagia   Past Medical History:  Diagnosis Date  . CAD S/P percutaneous coronary angioplasty 1998   PCI TO CX  . CKD (chronic kidney disease) stage 3, GFR 30-59 ml/min (HCC)   . Diabetes mellitus without complication ([facility])   . Diverticulitis 08/16/2017   hospitalized with diverticulitis/sepsis  . GERD (gastroesophageal reflux disease)   . Headache   . 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 Hypercholesteremia   . Hypertension   . Neuropathy   . Peripheral vascular disease ([place])    s/p R-L FEM-FEM BYPASS  . Pneumonia   . Tachycardia    after CABG, pt on medicine for this    Past Surgical History:  Procedure Laterality Date  . ABDOMINAL AORTOGRAM Bilateral 09/19/2016   Procedure: iliac;  Surgeon: Serafina Mitchell, MD;  Location: [facility];  Service: Cardiovascular;  Laterality: Bilateral;  . CARDIAC CATHETERIZATION  2003   with stent  . CARDIAC CATHETERIZATION N/A 10/11/2015   Procedure: Left Heart Cath and Coronary Angiography;  Surgeon: Peter M Martinique, MD;  Location: [facility];  Service: Cardiovascular;  Laterality: N/A;  . COLONOSCOPY N/A 09/22/2013   Procedure: COLONOSCOPY;  Surgeon: Daneil Dolin, MD;  [facility]: [facility];  Service: Endoscopy;  Laterality: N/A;  9:30 AM  . CORONARY ARTERY BYPASS GRAFT N/A 10/18/2015   Procedure: CORONARY ARTERY BYPASS GRAFTING (CABG) x  five, using left internal mammary artery and right leg greater saphenous vein harvested endoscopically;  Surgeon: [name], MD;  Location: [facility];  Service: Open Heart Surgery;  Laterality: N/A;  . ENDARTERECTOMY FEMORAL Right 08/09/2016   Procedure: ENDARTERECTOMY FEMORAL WITH VEIN PATCH ANGIOPLASTY;  Surgeon: Serafina Mitchell, MD;  Location: [facility];  Service: Vascular;  Laterality: Right;    . ESOPHAGOGASTRODUODENOSCOPY N/A 10/24/2017   Dr. Gala Romney: hiatal hernia  . FEMORAL-FEMORAL BYPASS GRAFT Bilateral 08/09/2016   Procedure: REVISION BYPASS GRAFT RIGHT FEMORAL-LEFT FEMORAL ARTERY;  Surgeon: Serafina Mitchell, MD;  Location: [facility];  Service: Vascular;  Laterality: Bilateral;  . FEMORAL-POPLITEAL BYPASS GRAFT    . PERIPHERAL VASCULAR CATHETERIZATION N/A 05/08/2016   Procedure: Lower Extremity Angiography;  Surgeon: Lorretta Harp, MD;  Location: [facility];  Service: Cardiovascular;  Laterality: N/A;  . PERIPHERAL VASCULAR INTERVENTION Right 09/19/2016   Procedure: Peripheral Vascular Intervention;  Surgeon: Serafina Mitchell, MD;  Location: [facility];  Service: Cardiovascular;  Laterality: Right;  ext iliac stent  . PROCTOSCOPY  10/24/2017   Procedure: PROCTOSCOPY;  Surgeon: Daneil Dolin, MD;  [facility];  Service: Endoscopy;;  . TEE WITHOUT CARDIOVERSION N/A 10/18/2015   Procedure: TRANSESOPHAGEAL ECHOCARDIOGRAM (TEE);  Surgeon: [name], MD;  Location: [facility];  Service: Open Heart Surgery;  Laterality: N/A;    Social History   Socioeconomic History  . Marital status: Married    Spouse name: Not on file  . Number of children: Not on file  . Years of education: Not on file  . Highest education level: Not on file  Occupational History  . Not on  file  Social Needs  . Financial resource strain: Not on file  . Food insecurity:    Worry: Not on file    Inability: Not on file  . Transportation needs:    Medical: Not on file    Non-medical: Not on file  Tobacco Use  . Smoking status: Former Smoker    Packs/day: 2.00    Years: 30.00    Pack years: 60.00    Types: Cigarettes    Last attempt to quit: 08/14/2000    Years since quitting: 17.3  . Smokeless tobacco: Never Used  Substance and Sexual Activity  . Alcohol use: No  . Drug use: No  . Sexual activity: Not on file  Lifestyle  . Physical activity:    Days per week: Not on file     Minutes per session: Not on file  . Stress: Not on file  Relationships  . Social connections:    Talks on phone: Not on file    Gets together: Not on file    Attends religious service: Not on file    Active member of club or organization: Not on file    Attends meetings of clubs or organizations: Not on file    Relationship status: Not on file  . Intimate partner violence:    Fear of current or ex partner: Not on file    Emotionally abused: Not on file    Physically abused: Not on file    Forced sexual activity: Not on file  Other Topics Concern  . Not on file  Social History Narrative  . Not on file    Mobility: Independent  Work history: Not obtained   Allergies  Allergen Reactions  . No Known Allergies     Family History  Problem Relation Age of Onset  . Heart attack Mother   . Stroke Mother   . Colon cancer Neg Hx      Prior to Admission medications   Medication Sig Start Date End Date Taking? Authorizing Provider  albuterol (PROVENTIL HFA;VENTOLIN HFA) 108 (90 Base) MCG/ACT inhaler Inhale 2 puffs into the lungs every 6 (six) hours as needed for wheezing or shortness of breath. 10/03/17   Kathie Dike, MD  aspirin 81 MG tablet Take 81 mg by mouth daily.    [provider]  Cholecalciferol (VITAMIN D) 2000 UNITS tablet Take 2,000 Units by mouth daily.    [provider]  cilostazol (PLETAL) 100 MG tablet Take 100 mg by mouth 2 (two) times daily.    [provider]  ciprofloxacin (CIPRO) 500 MG tablet Take 1 tablet (500 mg total) by mouth 2 (two) times daily. 12/07/17   Orson Eva, MD  ferrous sulfate 325 (65 FE) MG tablet Take 325 mg by mouth daily.     [provider]  gabapentin (NEURONTIN) 300 MG capsule Take 300-600 mg by mouth 2 (two) times daily. 343m in the morning and 6057mat bedtime    [provider]  guaiFENesin (MUCINEX) 600 MG 12 hr tablet Take 1 tablet (600 mg total) by mouth 2 (two) times daily.  10/03/17 10/03/18  MeKathie DikeMD  insulin glargine (LANTUS) 100 UNIT/ML injection Inject 0.2 mLs (20 Units total) into the skin at bedtime. Patient taking differently: Inject 25 Units into the skin at bedtime.  08/03/17   HeIsaac BlissEsRayford HalstedMD  linaclotide ([facility]145 MCG CAPS capsule Take 1 capsule (145 mcg total) by mouth daily before breakfast. 11/23/17   LeMahala Menghini  PA-C  metoprolol tartrate (LOPRESSOR) 25 MG tablet Take 25 mg by mouth 2 (two) times daily.    [provider]  metroNIDAZOLE (FLAGYL) 500 MG tablet Take 1 tablet (500 mg total) by mouth every 8 (eight) hours. 12/07/17   Orson Eva, MD  pantoprazole (PROTONIX) 40 MG tablet Take 1 tablet (40 mg total) by mouth daily. 08/19/17   Dhungel, Flonnie Overman, MD  simvastatin (ZOCOR) 80 MG tablet Take 40 mg by mouth at bedtime.     [provider]  vitamin B-12 (CYANOCOBALAMIN) 1000 MCG tablet Take 1,000 mcg by mouth daily.    [provider]    Physical Exam: Vitals:   12/12/17 1245 12/12/17 1300 12/12/17 1315 12/12/17 1330  BP: 128/72 120/70  115/64  Pulse: 100 98 94 94  Resp:      Temp:      TempSrc:      SpO2: 97% 98% 99% 99%  Weight:      Height:          Constitutional: NAD, calm, comfortable Eyes: PERRL, lids and conjunctivae normal ENMT: Mucous membranes are dry. Posterior pharynx clear of any exudate or lesions.Normal dentition.  Neck: normal, supple, no masses, no thyromegaly Respiratory: clear to auscultation bilaterally, no wheezing, no crackles. Normal respiratory effort. No accessory muscle use.  Cardiovascular: Regular rate and rhythm, no murmurs / rubs / gallops. No extremity edema. 2+ pedal pulses. No carotid bruits.  Abdomen: Focal LLQ tenderness with guarding and questionable subtle rebounding, no masses palpated. No hepatosplenomegaly. Bowel sounds positive but hypoactive and abdomen appears somewhat distended.  Musculoskeletal: no clubbing / cyanosis. No joint deformity  upper and lower extremities. Good ROM, no contractures. Normal muscle tone.  Skin: no rashes, lesions, ulcers. No induration Neurologic: CN 2-12 grossly intact. Sensation intact, DTR normal. Strength 5/5 x all 4 extremities.  Psychiatric: Normal judgment and insight. Alert and oriented x 3. Normal mood.    Labs on Admission: I have personally reviewed following labs and imaging studies  CBC: Recent Labs  Lab 12/05/17 2213  12/06/17 0838 12/06/17 1659 12/06/17 2152 12/07/17 0413 12/12/17 1110  WBC 14.5*  --  9.2  --   --  8.5 25.3*  NEUTROABS  --   --   --   --   --   --  24.3*  HGB 12.8*   < > 12.8* 11.0* 11.2* 11.7* 12.2*  HCT 40.3   < > 39.5 34.4* 34.9* 37.3* 37.8*  MCV 87.6  --  86.8  --   --  87.6 87.7  PLT 401*  --  386  --   --  347 403*   < > = values in this interval not displayed.   Basic Metabolic Panel: Recent Labs  Lab 12/05/17 2213 12/06/17 0838 12/07/17 0413 12/12/17 1110  NA 130* 133* 137 137  K 3.4* 3.9 3.9 3.0*  CL 88* 90* 98* 97*  CO2 _0 GLUCOSE 392* 133* 80 120*  BUN 29* 27* 28* 21*  CREATININE 2.06* 1.45* 1.17 1.37*  CALCIUM 9.0 9.2 8.7* 9.0   GFR: Estimated Creatinine Clearance: 45.3 mL/min (A) (by C-G formula based on SCr of 1.37 mg/dL (H)). Liver Function Tests: Recent Labs  Lab 12/05/17 2213 12/12/17 1110  AST 13* 18  ALT 12* 15*  ALKPHOS 89 86  BILITOT 0.6 0.4  PROT 6.9 6.3*  ALBUMIN 3.3* 3.1*   No results for input(s): LIPASE, AMYLASE in the last 168 hours. No results for input(s): AMMONIA  in the last 168 hours. Coagulation Profile: No results for input(s): INR, PROTIME in the last 168 hours. Cardiac Enzymes: No results for input(s): CKTOTAL, CKMB, CKMBINDEX, TROPONINI in the last 168 hours. BNP (last 3 results) No results for input(s): PROBNP in the last 8760 hours. HbA1C: No results for input(s): HGBA1C in the last 72 hours. CBG: Recent Labs  Lab 12/06/17 1935 12/06/17 2342 12/07/17 0416 12/07/17 0718  12/07/17 1102  GLUCAP 250* 223* 72 136* 235*   Lipid Profile: No results for input(s): CHOL, HDL, LDLCALC, TRIG, CHOLHDL, LDLDIRECT in the last 72 hours. Thyroid Function Tests: No results for input(s): TSH, T4TOTAL, FREET4, T3FREE, THYROIDAB in the last 72 hours. Anemia Panel: No results for input(s): VITAMINB12, FOLATE, FERRITIN, TIBC, IRON, RETICCTPCT in the last 72 hours. Urine analysis:    Component Value Date/Time   COLORURINE YELLOW 10/01/2017 1805   APPEARANCEUR CLEAR 10/01/2017 1805   LABSPEC 1.022 10/01/2017 1805   PHURINE 5.0 10/01/2017 1805   GLUCOSEU >=500 (A) 10/01/2017 1805   HGBUR SMALL (A) 10/01/2017 1805   BILIRUBINUR NEGATIVE 10/01/2017 1805   KETONESUR NEGATIVE 10/01/2017 1805   PROTEINUR NEGATIVE 10/01/2017 1805   UROBILINOGEN 0.2 03/16/2015 0120   NITRITE NEGATIVE 10/01/2017 1805   LEUKOCYTESUR NEGATIVE 10/01/2017 1805   Sepsis Labs: _0 (procalcitonin:4,lacticidven:4) ) Recent Results (from the past 240 hour(s))  MRSA PCR Screening     Status: None   Collection Time: 12/06/17  5:57 AM  Result Value Ref Range Status   MRSA by PCR NEGATIVE NEGATIVE Final    Comment:        The GeneXpert MRSA Assay (FDA approved for NASAL specimens only), is one component of a comprehensive MRSA colonization surveillance program. It is not intended to diagnose MRSA infection nor to guide or monitor treatment for MRSA infections. Performed at [facility], [address], [place], [place] [address]      Radiological Exams on Admission: Dg Chest Port 1 View  Result Date: 12/12/2017 CLINICAL DATA:  Leukocytosis EXAM: PORTABLE CHEST 1 VIEW COMPARISON:  10/01/2017 FINDINGS: Postop CABG without heart failure or edema. No pleural effusion. Negative for pneumonia IMPRESSION: No active disease. Electronically Signed   By: Franchot Gallo M.D.   On: 12/12/2017 13:49     Assessment/Plan Principal Problem:   Sepsis -Presents with recurrent abdominal pain, failure  to thrive symptoms, recent reemergence of fever and chills over the past 24 hours with significant leukocytosis and elevated serum lactate concerning for evolving sepsis physiology -Patient is otherwise hemodynamically stable -Source includes GI (see below) vs pulmonary vs genitourinary -CT abdomen and pelvis with oral contrast only -Chest x-ray -Urinalysis/ culture  -Follow-up on blood cultures obtained in ER -ESR -Treat underlying causes -Continue broad-spectrum coverage with Zosyn and vancomycin and narrow appropriately once definitive cause elucidated  Active Problems:   Diverticulitis/colonic wall thickening ?  Chronic abscess/giant diverticulum vs chronically perforated neoplasm -Appreciate general surgery assistance-anticipate surgical intervention this admission -Given abrupt increase in white cell count despite oral antibiotics at home and increasing abdominal pain with fever and chills we are concerned over possible perforation so will obtain CT abdomen and pelvis with contrast -NPO except for sips with meds -Continue IV Zosyn-if chest x-ray and urinalysis not convincing for infectious causes can likely discontinue vancomycin -Agree with obtaining CEA -IV morphine for pain -IV Zofran for nausea Revised Cardiac Risk Index High risk surgery: Yes History of ischemic heart disease: Yes History of congestive heart failure: No History of cerebrovascular disease: No Pre-operative treatment with insulin:  Yes Pre-operative creatinine >2 mg/dL (176.8 umol/L): No 3 points. Risk class IV. High risk. >11% risk of major cardiac event. **Agree with cardiology consultation preoperatively    Acute hypokalemia -IV repletion and follow lab    Hypertension -Current blood pressure well controlled and given known history of hypertension appears to be somewhat suboptimal -Utilize a low dose IV Lopressor pre-, peri-, and postoperatively but hold if SBP </= 90    Diabetes mellitus without  complication  -Continue Lantus 20 units nightly -Follow CBGs every 4 hours and provide SSI    CKD (chronic kidney disease), stage III  -GFR stable and at baseline    Hypercholesteremia -Hold preadmission Zocor    CAD S/P percutaneous coronary angioplasty/Hx of CABG -Last cardiac catheterization was in 2017 and patient was diagnosed with severe three-vessel CAD and subsequently underwent CABG procedure -Last followed up with CVTS/[facility] April 2019 and at that time patient not reporting any anginal symptoms and currently is symptom-free -He was continued on aspirin, statin, and beta-blocker -Patient has history of cavitary lesion right upper lobe, repeat CT chest April 2019 showed stability     PVD/prior fem-fem bypass and fem-pop bypass -On Pletal prior to admission  **Additional lab, imaging and/or diagnostic evaluation at discretion of supervising physician  DVT prophylaxis: Lovenox for now-transition to SCDs if [facility] plan surgical intervention Code Status: Full Family Communication: Wife Disposition Plan: None Consults called: Surgery/on-call physician; surgery documents they will consult cardiology for preoperative surgical clearance    Mitra Duling L. ANP-BC [facility] Pager [phone]   If 7PM-7AM, please contact night-coverage [email or web address] Password [email or web address]  12/12/2017, 1:57 PM

## 2017-12-12 NOTE — Consult Note (Signed)
[facility] Consult Note  [name] 07-Jun-1946  [id number].    Requesting MD: Ashok Cordia Chief Complaint/Reason for Consult: abdominal pain, weakness, LLQ collection  HPI:  Patient is a 72 year old male with PMH significant for CAD, PVD, HTN, T2DM, CKD stage III who presented to [facility] with worsening weakness, fatigue and abdominal pain. He was recently discharged from [facility] 12/07/17 after being treated for hematochezia and diverticulitis. GI attempted colonoscopy but prep was inadequate and procedure was aborted. Was discharged on PO cipro and flagyl without surgical evaluation at [facility]. Patient was scheduled to see a surgeon in [place] practice tomorrow. Since being discharged patient has experienced weakness, fatigue, subjective fevers, chills, tarry stools, and occasional abdominal pain. Patient also reports urinary frequency. Denies chest pain, SOB, nausea or vomiting, dysuria. No past abdominal surgeries. Does not take any blood thinning medications. NKDA. Denies current tobacco or alcohol use, although does report that he both drank alcohol and smoked cigarettes 20 years ago. Denies any history of illicit drug use.   ROS: Review of Systems  Constitutional: Positive for chills, fever, malaise/fatigue and weight loss.  Respiratory: Negative for shortness of breath.   Cardiovascular: Negative for chest pain and palpitations.  Gastrointestinal: Positive for abdominal pain and melena. Negative for blood in stool, constipation, diarrhea, nausea and vomiting.  Genitourinary: Positive for frequency. Negative for dysuria and urgency.  All other systems reviewed and are negative.   Family History  Problem Relation Age of Onset  . Heart attack Mother   . Stroke Mother   . Colon cancer Neg Hx     Past Medical History:  Diagnosis Date  . CAD S/P percutaneous coronary angioplasty 1998   PCI TO CX  . Diabetes mellitus without complication ([place])   .  Diverticulitis 08/16/2017   hospitalized with diverticulitis/sepsis  . GERD (gastroesophageal reflux disease)   . Headache   . 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 Hypercholesteremia   . Hypertension   . Neuropathy   . Peripheral vascular disease ([place])    s/p R-L FEM-FEM BYPASS  . Pneumonia   . Tachycardia    after CABG, pt on medicine for this    Past Surgical History:  Procedure Laterality Date  . ABDOMINAL AORTOGRAM Bilateral 09/19/2016   Procedure: iliac;  Surgeon: Serafina Mitchell, MD;  Location: [place] [facility];  Service: Cardiovascular;  Laterality: Bilateral;  . CARDIAC CATHETERIZATION  2003   with stent  . CARDIAC CATHETERIZATION N/A 10/11/2015   Procedure: Left Heart Cath and Coronary Angiography;  Surgeon: Peter M Martinique, MD;  Location: [place] [facility];  Service: Cardiovascular;  Laterality: N/A;  . COLONOSCOPY N/A 09/22/2013   Procedure: COLONOSCOPY;  Surgeon: Daneil Dolin, MD;  [facility];  Service: Endoscopy;  Laterality: N/A;  9:30 AM  . CORONARY ARTERY BYPASS GRAFT N/A 10/18/2015   Procedure: CORONARY ARTERY BYPASS GRAFTING (CABG) x  five, using left internal mammary artery and right leg greater saphenous vein harvested endoscopically;  Surgeon: [name], MD;  Location: [place];  Service: Open Heart Surgery;  Laterality: N/A;  . ENDARTERECTOMY FEMORAL Right 08/09/2016   Procedure: ENDARTERECTOMY FEMORAL WITH VEIN PATCH ANGIOPLASTY;  Surgeon: Serafina Mitchell, MD;  Location: [facility];  Service: Vascular;  Laterality: Right;  . ESOPHAGOGASTRODUODENOSCOPY N/A 10/24/2017   Dr. Gala Romney: hiatal hernia  . FEMORAL-FEMORAL BYPASS GRAFT Bilateral 08/09/2016   Procedure: REVISION BYPASS GRAFT RIGHT FEMORAL-LEFT FEMORAL ARTERY;  Surgeon: Serafina Mitchell, MD;  Location: [place];  Service: Vascular;  Laterality: Bilateral;  . FEMORAL-POPLITEAL BYPASS GRAFT    . PERIPHERAL VASCULAR CATHETERIZATION N/A 05/08/2016   Procedure: Lower Extremity Angiography;  Surgeon: Lorretta Harp, MD;  Location: [place] [facility];  Service: Cardiovascular;  Laterality: N/A;  . PERIPHERAL VASCULAR INTERVENTION Right 09/19/2016   Procedure: Peripheral Vascular Intervention;  Surgeon: Serafina Mitchell, MD;  Location: [place] [facility];  Service: Cardiovascular;  Laterality: Right;  ext iliac stent  . PROCTOSCOPY  10/24/2017   Procedure: PROCTOSCOPY;  Surgeon: Daneil Dolin, MD;  [facility];  Service: Endoscopy;;  . TEE WITHOUT CARDIOVERSION N/A 10/18/2015   Procedure: TRANSESOPHAGEAL ECHOCARDIOGRAM (TEE);  Surgeon: [name], MD;  Location: [place];  Service: Open Heart Surgery;  Laterality: N/A;    Social History:  reports that he quit smoking about 17 years ago. His smoking use included cigarettes. He has a 60.00 pack-year smoking history. He has never used smokeless tobacco. He reports that he does not drink alcohol or use drugs.  Allergies:  Allergies  Allergen Reactions  . No Known Allergies      (Not in a hospital admission)  Blood pressure (!) 117/59, pulse (!) 112, temperature 99.9 F (37.7 C), temperature source Oral, resp. rate 16, height '5\' 6"'  (1.676 m), weight 65.3 kg (144 lb), SpO2 97 %. Physical Exam: Physical Exam  Constitutional: He is oriented to person, place, and time. He appears well-developed. He is cooperative.  Non-toxic appearance. No distress.  HENT:  Head: Normocephalic and atraumatic.  Right Ear: External ear normal.  Left Ear: External ear normal.  Nose: Nose normal.  Mouth/Throat: Mucous membranes are normal.  Eyes: Pupils are equal, round, and reactive to light. Conjunctivae, EOM and lids are normal. No scleral icterus.  Neck: Normal range of motion and phonation normal. Neck supple.  Cardiovascular: Normal rate and regular rhythm.  Pulses:      Radial pulses are 2+ on the right side, and 2+ on the left side.       Dorsalis pedis pulses are 2+ on the right side, and 2+ on the left side.  Pulmonary/Chest: Effort normal  and breath sounds normal.  Abdominal: Soft. He exhibits distension (mild). He exhibits no mass. Bowel sounds are decreased. There is no hepatosplenomegaly. There is tenderness in the left lower quadrant. There is no rigidity, no rebound and no guarding. No hernia.  Musculoskeletal:  ROM grossly intact in bilateral upper and lower extremities  Neurological: He is alert and oriented to person, place, and time. He has normal strength. No sensory deficit.  Skin: Skin is warm, dry and intact.  Psychiatric: He has a normal mood and affect. His speech is normal and behavior is normal.    Results for orders placed or performed during the hospital encounter of 12/12/17 (from the past 48 hour(s))  Comprehensive metabolic panel     Status: Abnormal   Collection Time: 12/12/17 11:10 AM  Result Value Ref Range   Sodium 137 135 - 145 mmol/L   Potassium 3.0 (L) 3.5 - 5.1 mmol/L   Chloride 97 (L) 101 - 111 mmol/L   CO2 25 22 - 32 mmol/L   Glucose, Bld 120 (H) 65 - 99 mg/dL   BUN 21 (H) 6 - 20 mg/dL   Creatinine, Ser 1.37 (H) 0.61 - 1.24 mg/dL   Calcium 9.0 8.9 - 10.3 mg/dL   Total Protein 6.3 (L) 6.5 - 8.1 g/dL  Albumin 3.1 (L) 3.5 - 5.0 g/dL   AST 18 15 - 41 U/L   ALT 15 (L) 17 - 63 U/L   Alkaline Phosphatase 86 38 - 126 U/L   Total Bilirubin 0.4 0.3 - 1.2 mg/dL   GFR calc non Af Amer 51 (L) >60 mL/min   GFR calc Af Amer 59 (L) >60 mL/min    Comment: (NOTE) The eGFR has been calculated using the CKD EPI equation. This calculation has not been validated in all clinical situations. eGFR's persistently <60 mL/min signify possible Chronic Kidney Disease.    Anion gap 15 5 - 15    Comment: Performed at [facility] [address], [place], [place] [address]  CBC with Differential     Status: Abnormal (Preliminary result)   Collection Time: 12/12/17 11:10 AM  Result Value Ref Range   WBC 25.3 (H) 4.0 - 10.5 K/uL   RBC 4.31 4.22 - 5.81 MIL/uL   Hemoglobin 12.2 (L) 13.0 - 17.0 g/dL   HCT  37.8 (L) 39.0 - 52.0 %   MCV 87.7 78.0 - 100.0 fL   MCH 28.3 26.0 - 34.0 pg   MCHC 32.3 30.0 - 36.0 g/dL   RDW 14.6 11.5 - 15.5 %   Platelets 403 (H) 150 - 400 K/uL    Comment: Performed at [facility] [address] [place], [place] [address]   Neutrophils Relative % PENDING %   Neutro Abs PENDING 1.7 - 7.7 K/uL   Band Neutrophils PENDING %   Lymphocytes Relative PENDING %   Lymphs Abs PENDING 0.7 - 4.0 K/uL   Monocytes Relative PENDING %   Monocytes Absolute PENDING 0.1 - 1.0 K/uL   Eosinophils Relative PENDING %   Eosinophils Absolute PENDING 0.0 - 0.7 K/uL   Basophils Relative PENDING %   Basophils Absolute PENDING 0.0 - 0.1 K/uL   WBC Morphology PENDING    RBC Morphology PENDING    Smear Review PENDING    nRBC PENDING 0 /100 WBC   Metamyelocytes Relative PENDING %   Myelocytes PENDING %   Promyelocytes Relative PENDING %   Blasts PENDING %  I-Stat CG4 Lactic Acid, ED     Status: Abnormal   Collection Time: 12/12/17 11:14 AM  Result Value Ref Range   Lactic Acid, Venous 2.22 (HH) 0.5 - 1.9 mmol/L   Comment NOTIFIED PHYSICIAN    No results found.    Assessment/Plan CAD - Hx of CABG 2017, last echo in 2017 showed EF 50-55% HTN PVD - Hx of fem-fem bypass, also has fem-pop bypass T2DM CKD stage III  Sigmoid diverticulosis Focal area of sigmoid colonic wall thickening and mass - differential includes chronic abscess vs large diverticulum vs chronically perforated neoplasm - WBC 25, afebrile - No sign of peritonitis or indication for acute surgical intervention - will order CEA, patient had colonoscopy in 2015 that showed diverticula but no concern for malignancy - may end up requiring surgery this admission which would likely mean Hartmann's   FEN: may have clears, but will make NPO after midnight VTE: SCDs, no chemical VTE  ID: IV zosyn 5/1>>  Will consult cardiology and get cardiac clearance. Likely OR tomorrow. Will order CEA level.   Brigid Re,  [facility] Surgery 12/12/2017, 12:43 PM Pager: [phone] Consults: [phone] Mon-Fri 7:00 am-4:30 pm Sat-Sun 7:00 am-11:30 am

## 2017-12-12 NOTE — Progress Notes (Signed)
Pharmacy Antibiotic Note  [name] is a 72 y.o. male admitted on 12/12/2017 with sepsis.  Pharmacy has been consulted for vancomycin and Zosyn dosing.  Plan: Vancomycin 1g IV x1, then 500mg  IV q12h Zosyn 3.375g IV q8h EI Follow c/s, clinical progression, de-escalation/LOT, renal function, level PRN  Height: 5\' 6"  (167.6 cm) Weight: 144 lb (65.3 kg) IBW/kg (Calculated) : 63.8  Temp (24hrs), Avg:99.9 F (37.7 C), Min:99.9 F (37.7 C), Max:99.9 F (37.7 C)  Recent Labs  Lab 12/05/17 2213 12/06/17 0838 12/07/17 0413 12/12/17 1110 12/12/17 1114  WBC 14.5* 9.2 8.5 25.3*  --   CREATININE 2.06* 1.45* 1.17 1.37*  --   LATICACIDVEN  --   --   --   --  2.22*    Estimated Creatinine Clearance: 45.3 mL/min (A) (by C-G formula based on SCr of 1.37 mg/dL (H)).    Allergies  Allergen Reactions  . No Known Allergies     Antimicrobials this admission: Vancomycin 5/1 >>  Zosyn 5/1 >>   Dose adjustments this admission: n/a  Microbiology results: 5/1 BCx:   Thank you for allowing pharmacy to be a part of this patient's care.  Roxene Alviar D. Aleli Navedo, PharmD, BCPS Clinical Pharmacist Clinical Phone for 12/12/2017 until 3:30pm: [phone] If after 3:30pm, please call main pharmacy at [phone] 12/12/2017 12:49 PM

## 2017-12-12 NOTE — Consult Note (Signed)
Cardiology Consult    Patient ID: [name] MRN: [id number], DOB/AGE: [birth year]   Admit date: 12/12/2017 Date of Consult: 12/12/2017  Primary Physician: [name], MD Primary Cardiologist: Dr. Gwenlyn Found  Requesting Provider: Dr. Lorin Mercy Reason for Consultation: Pre op  [name] is a 72 y.o. male who is being seen today for the evaluation of Pre op at the request of Dr. Lorin Mercy.   Patient Profile    72 yo with PMH of HTN, HL, PAD s/p R-->L  Fem/fem bypass, DM, CAD s/p 4v CABG who presented with abd pain.   Past Medical History   Past Medical History:  Diagnosis Date  . CAD S/P percutaneous coronary angioplasty 1998   PCI TO CX  . CKD (chronic kidney disease) stage 3, GFR 30-59 ml/min (HCC)   . Diabetes mellitus without complication ([place])   . Diverticulitis 08/16/2017   hospitalized with diverticulitis/sepsis  . GERD (gastroesophageal reflux disease)   . Headache   . 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 Hypercholesteremia   . Hypertension   . Neuropathy   . Peripheral vascular disease ([place])    s/p R-L FEM-FEM BYPASS  . Pneumonia   . Tachycardia    after CABG, pt on medicine for this    Past Surgical History:  Procedure Laterality Date  . ABDOMINAL AORTOGRAM Bilateral 09/19/2016   Procedure: iliac;  Surgeon: Serafina Mitchell, MD;  Location: [facility] [facility];  Service: Cardiovascular;  Laterality: Bilateral;  . CARDIAC CATHETERIZATION  2003   with stent  . CARDIAC CATHETERIZATION N/A 10/11/2015   Procedure: Left Heart Cath and Coronary Angiography;  Surgeon: Peter M Martinique, MD;  Location: [place] [facility];  Service: Cardiovascular;  Laterality: N/A;  . COLONOSCOPY N/A 09/22/2013   Procedure: COLONOSCOPY;  Surgeon: Daneil Dolin, MD;  Location: [facility];  Service: Endoscopy;  Laterality: N/A;  9:30 AM  . CORONARY ARTERY BYPASS GRAFT N/A 10/18/2015   Procedure: CORONARY ARTERY BYPASS GRAFTING (CABG) x  five, using left internal mammary artery and right leg greater  saphenous vein harvested endoscopically;  Surgeon: [name], MD;  Location: [facility];  Service: Open Heart Surgery;  Laterality: N/A;  . ENDARTERECTOMY FEMORAL Right 08/09/2016   Procedure: ENDARTERECTOMY FEMORAL WITH VEIN PATCH ANGIOPLASTY;  Surgeon: Serafina Mitchell, MD;  Location: [facility];  Service: Vascular;  Laterality: Right;  . ESOPHAGOGASTRODUODENOSCOPY N/A 10/24/2017   Dr. Gala Romney: hiatal hernia  . FEMORAL-FEMORAL BYPASS GRAFT Bilateral 08/09/2016   Procedure: REVISION BYPASS GRAFT RIGHT FEMORAL-LEFT FEMORAL ARTERY;  Surgeon: Serafina Mitchell, MD;  Location: [facility];  Service: Vascular;  Laterality: Bilateral;  . FEMORAL-POPLITEAL BYPASS GRAFT    . PERIPHERAL VASCULAR CATHETERIZATION N/A 05/08/2016   Procedure: Lower Extremity Angiography;  Surgeon: Lorretta Harp, MD;  Location: [place];  Service: Cardiovascular;  Laterality: N/A;  . PERIPHERAL VASCULAR INTERVENTION Right 09/19/2016   Procedure: Peripheral Vascular Intervention;  Surgeon: Serafina Mitchell, MD;  Location: [place] [facility];  Service: Cardiovascular;  Laterality: Right;  ext iliac stent  . PROCTOSCOPY  10/24/2017   Procedure: PROCTOSCOPY;  Surgeon: Daneil Dolin, MD;  [facility];  Service: Endoscopy;;  . TEE WITHOUT CARDIOVERSION N/A 10/18/2015   Procedure: TRANSESOPHAGEAL ECHOCARDIOGRAM (TEE);  Surgeon: [name], MD;  Location: [facility];  Service: Open Heart Surgery;  Laterality: N/A;     Allergies  No Active Allergies  History of Present Illness  [name] is a 72 yo male with PMH of HTN, HL, PAD s/p R-->L  Fem/fem bypass, DM, CAD s/p 4v CABG. Was cathed back in 2/17 with 3v disease. Underwent CABG with LIMA-LAD, SVG--Diag, SVG--OM1/OM2, and SVG--RCA. EF at that time was noted at 50-55%. Had abnormal ABIs 9/17 and underwent PV angiogram with patent R-L fem/fem bypass graft, patent right iliac stent and occluded SFA bilaterally. Underwent right iliofemoral atherectomy on 12/17 with Dr.  Trula Slade. His post procedure dopplers worsened shortly after and underwent stenting of high grade stenosis in the right external iliac artery with Dr. Trula Slade in 2/18. He was last seen in the office by Dr. Gwenlyn Found on 7/18 and reported doing well overall.   He reports developing abd pain back several months ago. Has been seen in the ED several times since this started. He was recently hospitalized and discharged from [facility] 12/07/17 after being treated for hematochezia and diverticulitis. Did have an attempt at colonoscopy but prep was inadequate and procedure was cancelled. He was discharged home on Cipro and Flagyl. He was scheduled to see [facility] tomorrow.   Presented back to the ED with worsening fatigue, fevers, chills, and abd pain. Reports decreased appetite, and weight loss. Labs showed K+ 3.0, Cr 1.37, Lactic Acid 2.22, WBC 25.3, Hgb 12.2. CXR neg. Previous CT on 12/06/17 showed focal area of sigmoid colonic wall thickening and mass. Seen by general surgery and planned for possible surgery in the am pending cardiac evaluation.   In talking with the patient he denies any cardiac complaints prior to admission. States until developing issues with his abd, he was doing pretty well at home. No chest pain, or shortness of breath. Working around his home and walks to [employer] without symptoms. No s/s of CHF noted.   Inpatient Medications    . cilostazol  100 mg Oral BID  . insulin aspart  0-9 Units Subcutaneous Q4H  . insulin glargine  20 Units Subcutaneous QHS  . metoprolol tartrate  5 mg Intravenous Q8H  . polyethylene glycol powder  1 Container Oral Once  . sodium chloride flush  3 mL Intravenous Q12H    Family History    Family History  Problem Relation Age of Onset  . Heart attack Mother   . Stroke Mother   . Colon cancer Neg Hx     Social History    Social History   Socioeconomic History  . Marital status: Married    Spouse name: Not on file  . Number of children: Not on file    . Years of education: Not on file  . Highest education level: Not on file  Occupational History  . Not on file  Social Needs  . Financial resource strain: Not on file  . Food insecurity:    Worry: Not on file    Inability: Not on file  . Transportation needs:    Medical: Not on file    Non-medical: Not on file  Tobacco Use  . Smoking status: Former Smoker    Packs/day: 2.00    Years: 30.00    Pack years: 60.00    Types: Cigarettes    Last attempt to quit: 08/14/2000    Years since quitting: 17.3  . Smokeless tobacco: Never Used  Substance and Sexual Activity  . Alcohol use: No  . Drug use: No  . Sexual activity: Not on file  Lifestyle  . Physical activity:    Days per week: Not on file  Minutes per session: Not on file  . Stress: Not on file  Relationships  . Social connections:    Talks on phone: Not on file    Gets together: Not on file    Attends religious service: Not on file    Active member of club or organization: Not on file    Attends meetings of clubs or organizations: Not on file    Relationship status: Not on file  . Intimate partner violence:    Fear of current or ex partner: Not on file    Emotionally abused: Not on file    Physically abused: Not on file    Forced sexual activity: Not on file  Other Topics Concern  . Not on file  Social History Narrative  . Not on file     Review of Systems    See HPI  All other systems reviewed and are otherwise negative except as noted above.  Physical Exam    Blood pressure 116/60, pulse 87, temperature 99.9 F (37.7 C), temperature source Oral, resp. rate 15, height 5\' 6"  (1.676 m), weight 144 lb (65.3 kg), SpO2 98 %.  General: Pleasant, older WM NAD Psych: Normal affect. Neuro: Alert and oriented X 3. Moves all extremities spontaneously. HEENT: Normal  Neck: Supple without bruits or JVD. Lungs:  Resp regular and unlabored, CTA. Heart: RRR no s3, s4, or murmurs. Abdomen: Soft, non-tender,  non-distended, BS + x 4.  Extremities: No clubbing, cyanosis or edema. DP/PT/Radials 2+ and equal bilaterally.  Labs    Troponin (Point of Care Test) No results for input(s): TROPIPOC in the last 72 hours. No results for input(s): CKTOTAL, CKMB, TROPONINI in the last 72 hours. Lab Results  Component Value Date   WBC 25.3 (H) 12/12/2017   HGB 12.2 (L) 12/12/2017   HCT 37.8 (L) 12/12/2017   MCV 87.7 12/12/2017   PLT 403 (H) 12/12/2017    Recent Labs  Lab 12/12/17 1110  NA 137  K 3.0*  CL 97*  CO2 25  BUN 21*  CREATININE 1.37*  CALCIUM 9.0  PROT 6.3*  BILITOT 0.4  ALKPHOS 86  ALT 15*  AST 18  GLUCOSE 120*   Lab Results  Component Value Date   CHOL 110 10/11/2015   HDL 27 (L) 10/11/2015   LDLCALC 56 10/11/2015   TRIG 135 10/11/2015   Lab Results  Component Value Date   DDIMER (H) 01/03/2010    1.76        AT THE INHOUSE ESTABLISHED CUTOFF VALUE OF 0.48 ug/mL FEU, THIS ASSAY HAS BEEN DOCUMENTED IN THE LITERATURE TO HAVE A SENSITIVITY AND NEGATIVE PREDICTIVE VALUE OF AT LEAST 98 TO 99%.  THE TEST RESULT SHOULD BE CORRELATED WITH AN ASSESSMENT OF THE CLINICAL PROBABILITY OF DVT / VTE.     Radiology Studies    Ct Abdomen Pelvis Wo Contrast  Result Date: 12/06/2017 CLINICAL DATA:  Bloody bowel movement. History of diverticulitis, fem-fem bypass, fem-pop bypass. EXAM: CT ABDOMEN AND PELVIS WITHOUT CONTRAST TECHNIQUE: Multidetector CT imaging of the abdomen and pelvis was performed following the standard protocol without IV contrast. COMPARISON:  CT abdomen and pelvis November 26, 2017 and August 17, 2017 FINDINGS: LOWER CHEST: Lung bases are clear. The visualized heart size is normal. Severe coronary artery calcifications and/or stents. No pericardial effusion. HEPATOBILIARY: Normal. PANCREAS: Nonacute.  Atrophic. SPLEEN: Normal. ADRENALS/URINARY TRACT: Kidneys are orthotopic, demonstrating normal size and morphology. Bilateral vascular calcifications. 4.1 cm  benign-appearing RIGHT upper pole cyst. 1 cm cyst  LEFT upper pole. Too small to characterize hypodensities bilateral kidneys. Focal scarring LEFT kidney. No nephrolithiasis, hydronephrosis; limited assessment for renal masses on this nonenhanced examination. The unopacified ureters are normal in course and caliber. Urinary bladder is well distended and unremarkable. Normal adrenal glands. STOMACH/BOWEL: Short segment of sigmoid wall thickening, large stool filled outpouching with similar surrounding fat stranding. Numerous tiny surrounding lymph nodes. Moderate sigmoid diverticulosis. Moderate amount of retained large bowel stool. Small amount of small bowel feces compatible with chronic stasis. The stomach, small bowel are normal in course and caliber without inflammatory changes, sensitivity decreased by lack of enteric contrast. Normal appendix. VASCULAR/LYMPHATIC: Aortoiliac vessels are normal in course and caliber. Severe calcific atherosclerosis. 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 No lymphadenopathy by CT size criteria. REPRODUCTIVE: Normal. OTHER: No intraperitoneal free fluid or free air. MUSCULOSKELETAL: Non-acute. Status post median sternotomy. Moderate RIGHT and small to moderate LEFT fat containing inguinal hernias. Surgical clips LEFT inguinal soft tissues compatible with previous vascular access. Fem-fem bypass noted. Osteopenia. IMPRESSION: 1. Similar appearance of focal sigmoid colonic wall thickening and large stool filled inflamed outpouching. Differential diagnosis includes chronic abscess, giant diverticulum, chronically perforated neoplasm. Recommend direct inspection if not imaged during proctoscopy October 24, 2017. Aortic Atherosclerosis (ICD10-I70.0). Electronically Signed   By: Elon Alas M.D.   On: 12/06/2017 02:27   Ct Chest Wo Contrast  Result Date: 11/27/2017 CLINICAL DATA:  Follow-up lung nodule, right-sided chest pain following fall 2 days ago, initial encounter EXAM: CT CHEST  WITHOUT CONTRAST TECHNIQUE: Multidetector CT imaging of the chest was performed following the standard protocol without IV contrast. COMPARISON:  08/17/2017, 12/05/2016, 10/13/2015 FINDINGS: Cardiovascular: Atherosclerotic changes of the aorta are noted. Changes of prior coronary bypass grafting are seen. No cardiac enlargement is seen. No pericardial fluid is noted. Mediastinum/Nodes: Thoracic inlet is unremarkable. No hilar or mediastinal adenopathy is seen. The esophagus is unremarkable. Lungs/Pleura: Lungs are well aerated bilaterally. Scattered areas of ground-glass density are identified throughout the right lung. These are new from the prior exam. These likely represent postinflammatory change. Previously seen nodule in the right middle lobe is stable in appearance when compared with previous exams dating back to 12/05/2016. No other significant nodules are noted. Upper Abdomen: Visualized upper abdomen demonstrates right renal cyst stable from the previous exam. Musculoskeletal: Degenerative changes of the thoracic spine are noted. No acute rib abnormality is noted. IMPRESSION: Previously seen right middle lobe nodular changes are stable from previous exam dating back to 10/13/2015 Scattered ground-glass densities are noted throughout the right lung likely postinflammatory in nature and improved from previous exams. No new focal infiltrate is seen. No acute rib abnormality is noted. Aortic Atherosclerosis (ICD10-I70.0). Electronically Signed   By: Inez Catalina M.D.   On: 11/27/2017 12:41   Ct Abdomen Pelvis W Contrast  Result Date: 11/26/2017 CLINICAL DATA:  Left lower quadrant abdominal fullness. EXAM: CT ABDOMEN AND PELVIS WITH CONTRAST TECHNIQUE: Multidetector CT imaging of the abdomen and pelvis was performed using the standard protocol following bolus administration of intravenous contrast. CONTRAST:  75mL ISOVUE-300 IOPAMIDOL (ISOVUE-300) INJECTION 61% COMPARISON:  08/17/2017 PET CT 11/30/2015.  CT  abdomen 02/02/2005. FINDINGS: Lower chest: Normal Hepatobiliary: Normal Pancreas: Normal Spleen: Normal Adrenals/Urinary Tract: Adrenal glands are normal. Mild renal atrophy, left more than right. Bilateral renal cysts, the largest on the right measuring up to 4.5 cm in diameter. Extensive renal vascular calcification. Stomach/Bowel: Large amount of fecal matter in the colon. As seen previously, the patient has chronic diverticulosis throughout the sigmoid colon.  At the proximal sigmoid, there is an approximately 7 cm region containing stool, in communication with the main lumen of the sigmoid colon, with mild surrounding inflammatory change. This could represent a giant diverticulum containing stool or could represent a chronic contained walled-off perforation an abscess in wide communication with sigmoid lumen. Vascular/Lymphatic: Aortic atherosclerosis. No aneurysm. Advanced aortic branch vessel atherosclerosis. Right to left femoral to femoral bypass. Reproductive: Normal Other: No free fluid or air. Musculoskeletal: Negative IMPRESSION: The area of clinical concern corresponds to an approximately 7 cm in diameter abnormality arising from the sigmoid colon which contains stool and contrast, apparently in wide communication with the sigmoid colon. The differential diagnosis is giant diverticulum versus chronic walled-off communicating cavity related to previous diverticulitis and abscess. Patient does have extensive diverticulosis of the sigmoid colon in that region without definite acute diverticulitis. The finding is similar to previous scans going back as far as April of 2017. Advanced aortic atherosclerosis and atherosclerosis of the branch vessels. Electronically Signed   By: Nelson Chimes M.D.   On: 11/26/2017 14:24   Dg Chest Port 1 View  Result Date: 12/12/2017 CLINICAL DATA:  Leukocytosis EXAM: PORTABLE CHEST 1 VIEW COMPARISON:  10/01/2017 FINDINGS: Postop CABG without heart failure or edema. No  pleural effusion. Negative for pneumonia IMPRESSION: No active disease. Electronically Signed   By: Franchot Gallo M.D.   On: 12/12/2017 13:49    ECG & Cardiac Imaging    EKG:  The EKG was personally reviewed and demonstrates (none ordered yet)  Echo: 10/18/15  Study Conclusions  - Left ventricle: The cavity size was normal. Wall thickness was   normal. Systolic function was normal. The estimated ejection   fraction was in the range of 50% to 55%. There was no dynamic   obstruction. Hypokinesis of the anterior and anterolateral   myocardium; consistent with ischemia. - Aortic valve: No evidence of vegetation. - Mitral valve: No evidence of vegetation. - Atrial septum: No defect or patent foramen ovale was identified.  Impressions:  - Ejection Fraction of 50%. Mild hypokinesis of the anterolateral   and anterior walls. Trivial mitral and tricuspid regurgitation.  Assessment & Plan    72 yo with PMH of HTN, HL, PAD s/p R-->L  Fem/fem bypass, DM, CAD s/p 4v CABG who presented with abd pain.   Pre op evaluation: He does have extensive cardiac hx with 4v CABG in 2017. Normal EF on echo. Has been doing well from a cardiac standpoint since his bypass with no cardiac symptoms. No chest pain or shortness of breath. He is high risk based off his cardiac hx but has been essentially asymptomatic from a cardiac standpoint prior to admission. Do not anticipate further cardiac work up at this time. Will get a pre op EKG today.   Hypokalemia: supplement ordered  CAD s/p CABG: no anginal symptoms. Normal EF on echo from 2017 with no reports s/s of CHF.  Barnet Pall, NP-C Pager [phone] 12/12/2017, 3:59 PM

## 2017-12-12 NOTE — ED Notes (Signed)
Ordered clear liquid tray 

## 2017-12-12 NOTE — ED Notes (Signed)
Patient is now drinking oral contrast. Will give Glycolax/Miralax once finished with CT scan.

## 2017-12-12 NOTE — Telephone Encounter (Signed)
Patient daughter called in and is requesting to speak to [facility] personally regarding this patient. Did no elaborate on what this is regarding. Please advise leslie thanks

## 2017-12-12 NOTE — ED Triage Notes (Addendum)
Pt endorses weakness and chills x 2 months. Pt has been seen at [facility] multiple times for same. Had episode of weakness where he needed help getting back to bed from the restroom this morning. Pt has mass on colon and is supposed to see surgeon for. 99.9 oral temp in triage. Tachy 112. No neuro deficits.

## 2017-12-12 NOTE — ED Notes (Signed)
Ordered diet tray for pt  

## 2017-12-12 NOTE — ED Provider Notes (Addendum)
[facility] EMERGENCY DEPARTMENT Provider Note   CSN: [id number] Arrival date & time: 12/12/17  1037     History   Chief Complaint Chief Complaint  Patient presents with  . Fever  . Weakness    HPI [name] is a 72 y.o. male.  Patient c/o fever, and feeling weak/faint this AM, as if about to pass out. Symptoms were acute onset, constant, severe, worse when standing. Spouse said his color was gray, and he looked like he was about to die. Patient recently discharged from [facility] after tx for similar episode. Since January patient has not feel well. Recurrent LLQ abd pain, progressive wt loss, recurrent fevers/infection, and persistent failure to thrive. Pt continues on cipro and flagyl now, but felt worse this AM.   The history is provided by the patient.  Fever   Pertinent negatives include no chest pain, no headaches and no sore throat.  Weakness  Pertinent negatives include no shortness of breath, no chest pain, no confusion and no headaches.    Past Medical History:  Diagnosis Date  . CAD S/P percutaneous coronary angioplasty 1998   PCI TO CX  . Diabetes mellitus without complication ([place])   . Diverticulitis 08/16/2017   hospitalized with diverticulitis/sepsis  . GERD (gastroesophageal reflux disease)   . Headache   . 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 Hypercholesteremia   . Hypertension   . Neuropathy   . Peripheral vascular disease ([place])    s/p R-L FEM-FEM BYPASS  . Pneumonia   . Tachycardia    after CABG, pt on medicine for this    Patient Active Problem List   Diagnosis Date Noted  . GI bleed 12/06/2017  . Rectal bleed 12/06/2017  . Uncontrolled type 2 diabetes mellitus with hyperglycemia, with long-term current use of insulin ([place]) 12/06/2017  . Acute renal failure superimposed on stage 3 chronic kidney disease ([place]) 12/06/2017  . Malnutrition of moderate degree 12/06/2017  . Abnormal CT scan, colon 11/23/2017  . Constipation 11/23/2017  .  Lobar pneumonia ([place]) 10/02/2017  . Sepsis due to pneumonia ([place]) 10/01/2017  . Diverticulitis large intestine 08/19/2017  . Diverticulitis of large intestine with perforation and abscess without bleeding   . CKD (chronic kidney disease) stage 3, GFR 30-59 ml/min (HCC) 08/02/2017  . Sepsis due to undetermined organism ([place]) 08/01/2017  . GERD (gastroesophageal reflux disease) 08/01/2017  . Bacteremia 06/05/2017  . SIRS (systemic inflammatory response syndrome) ([place]) 05/31/2017  . PAD (peripheral artery disease) ([place]) 08/09/2016  . Claudication ([place]) 05/08/2016  . HCAP (healthcare-associated pneumonia) 01/12/2016  . Hyponatremia 01/12/2016  . Abnormal chest CT 10/13/2015  . Type 2 diabetes, HbA1c goal < 7% (HCC)   . Unstable angina ([place])   . Hypomagnesemia   . Healthcare-associated pneumonia 10/09/2015  . Type 2 diabetes mellitus ([place]) 10/09/2015  . ASCVD (arteriosclerotic cardiovascular disease) 10/09/2015  . CKD (chronic kidney disease) stage 2, GFR 60-89 ml/min 10/09/2015  . CAP (community acquired pneumonia) 07/09/2015  . Sepsis ([place]) 07/09/2015  . 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 PVD (peripheral vascular disease) ([place]) 07/09/2015  . Essential hypertension 07/09/2015  . Hypotension 07/09/2015  . Neuropathy, peripheral 07/09/2015  . HLD (hyperlipidemia) 07/09/2015  . AKI (acute kidney injury) ([place]) 07/09/2015    Past Surgical History:  Procedure Laterality Date  . ABDOMINAL AORTOGRAM Bilateral 09/19/2016   Procedure: iliac;  Surgeon: Serafina Mitchell, MD;  Location: [facility];  Service:  Cardiovascular;  Laterality: Bilateral;  . CARDIAC CATHETERIZATION  2003   with stent  . CARDIAC CATHETERIZATION N/A 10/11/2015   Procedure: Left Heart Cath and Coronary Angiography;  Surgeon: Peter M Martinique, MD;  Location: [facility];  Service: Cardiovascular;  Laterality: N/A;  . COLONOSCOPY N/A 09/22/2013   Procedure: COLONOSCOPY;  Surgeon: Daneil Dolin, MD;   Location: [facility];  Service: Endoscopy;  Laterality: N/A;  9:30 AM  . CORONARY ARTERY BYPASS GRAFT N/A 10/18/2015   Procedure: CORONARY ARTERY BYPASS GRAFTING (CABG) x  five, using left internal mammary artery and right leg greater saphenous vein harvested endoscopically;  Surgeon: [name], MD;  Location: [facility];  Service: Open Heart Surgery;  Laterality: N/A;  . ENDARTERECTOMY FEMORAL Right 08/09/2016   Procedure: ENDARTERECTOMY FEMORAL WITH VEIN PATCH ANGIOPLASTY;  Surgeon: Serafina Mitchell, MD;  Location: [facility];  Service: Vascular;  Laterality: Right;  . ESOPHAGOGASTRODUODENOSCOPY N/A 10/24/2017   Dr. Gala Romney: hiatal hernia  . FEMORAL-FEMORAL BYPASS GRAFT Bilateral 08/09/2016   Procedure: REVISION BYPASS GRAFT RIGHT FEMORAL-LEFT FEMORAL ARTERY;  Surgeon: Serafina Mitchell, MD;  Location: [facility];  Service: Vascular;  Laterality: Bilateral;  . FEMORAL-POPLITEAL BYPASS GRAFT    . PERIPHERAL VASCULAR CATHETERIZATION N/A 05/08/2016   Procedure: Lower Extremity Angiography;  Surgeon: Lorretta Harp, MD;  Location: [facility];  Service: Cardiovascular;  Laterality: N/A;  . PERIPHERAL VASCULAR INTERVENTION Right 09/19/2016   Procedure: Peripheral Vascular Intervention;  Surgeon: Serafina Mitchell, MD;  Location: [facility];  Service: Cardiovascular;  Laterality: Right;  ext iliac stent  . PROCTOSCOPY  10/24/2017   Procedure: PROCTOSCOPY;  Surgeon: Daneil Dolin, MD;  [facility];  Service: Endoscopy;;  . TEE WITHOUT CARDIOVERSION N/A 10/18/2015   Procedure: TRANSESOPHAGEAL ECHOCARDIOGRAM (TEE);  Surgeon: [name], MD;  Location: [facility];  Service: Open Heart Surgery;  Laterality: N/A;        Home Medications    Prior to Admission medications   Medication Sig Start Date End Date Taking? Authorizing Provider  albuterol (PROVENTIL HFA;VENTOLIN HFA) 108 (90 Base) MCG/ACT inhaler Inhale 2 puffs into the lungs every 6 (six) hours as needed for wheezing or  shortness of breath. 10/03/17   Kathie Dike, MD  aspirin 81 MG tablet Take 81 mg by mouth daily.    [provider]  Cholecalciferol (VITAMIN D) 2000 UNITS tablet Take 2,000 Units by mouth daily.    [provider]  cilostazol (PLETAL) 100 MG tablet Take 100 mg by mouth 2 (two) times daily.    [provider]  ciprofloxacin (CIPRO) 500 MG tablet Take 1 tablet (500 mg total) by mouth 2 (two) times daily. 12/07/17   Orson Eva, MD  ferrous sulfate 325 (65 FE) MG tablet Take 325 mg by mouth daily.     [provider]  gabapentin (NEURONTIN) 300 MG capsule Take 300-600 mg by mouth 2 (two) times daily. 300mg  in the morning and 600mg  at bedtime    [provider]  guaiFENesin (MUCINEX) 600 MG 12 hr tablet Take 1 tablet (600 mg total) by mouth 2 (two) times daily. 10/03/17 10/03/18  Kathie Dike, MD  insulin glargine (LANTUS) 100 UNIT/ML injection Inject 0.2 mLs (20 Units total) into the skin at bedtime. Patient taking differently: Inject 25 Units into the skin at bedtime.  08/03/17   Isaac Bliss, Rayford Halsted, MD  linaclotide [facility]) 145 MCG CAPS capsule Take 1 capsule (145 mcg total) by  mouth daily before breakfast. 11/23/17   Mahala Menghini, PA-C  metoprolol tartrate (LOPRESSOR) 25 MG tablet Take 25 mg by mouth 2 (two) times daily.    [provider]  metroNIDAZOLE (FLAGYL) 500 MG tablet Take 1 tablet (500 mg total) by mouth every 8 (eight) hours. 12/07/17   Orson Eva, MD  pantoprazole (PROTONIX) 40 MG tablet Take 1 tablet (40 mg total) by mouth daily. 08/19/17   Dhungel, Flonnie Overman, MD  simvastatin (ZOCOR) 80 MG tablet Take 40 mg by mouth at bedtime.     [provider]  vitamin B-12 (CYANOCOBALAMIN) 1000 MCG tablet Take 1,000 mcg by mouth daily.    [provider]    Family History Family History  Problem Relation Age of Onset  . Heart attack Mother   . Stroke Mother   . Colon cancer Neg Hx     Social History Social  History   Tobacco Use  . Smoking status: Former Smoker    Packs/day: 2.00    Years: 30.00    Pack years: 60.00    Types: Cigarettes    Last attempt to quit: 08/14/2000    Years since quitting: 17.3  . Smokeless tobacco: Never Used  Substance Use Topics  . Alcohol use: No  . Drug use: No     Allergies   No known allergies   Review of Systems Review of Systems  Constitutional: Positive for fever.  HENT: Negative for sore throat.   Eyes: Negative for redness.  Respiratory: Negative for shortness of breath.   Cardiovascular: Negative for chest pain.  Gastrointestinal: Negative for abdominal pain.  Genitourinary: Negative for flank pain.  Musculoskeletal: Negative for back pain and neck pain.  Skin: Negative for rash.  Neurological: Positive for weakness. Negative for headaches.  Hematological: Does not bruise/bleed easily.  Psychiatric/Behavioral: Negative for confusion.     Physical Exam Updated Vital Signs BP (!) 117/59 (BP Location: Right Arm)   Pulse (!) 112   Temp 99.9 F (37.7 C) (Oral)   Resp 16   Ht 1.676 m (5\' 6" )   Wt 65.3 kg (144 lb)   SpO2 97%   BMI 23.24 kg/m   Physical Exam  Constitutional: He appears well-developed and well-nourished. He appears distressed.  HENT:  Mouth/Throat: Oropharynx is clear and moist.  Eyes: Conjunctivae are normal.  Neck: Neck supple. No tracheal deviation present.  Cardiovascular: Normal heart sounds and intact distal pulses.  tachycardic  Pulmonary/Chest: Effort normal and breath sounds normal. No accessory muscle usage. No respiratory distress.  Abdominal: Soft. He exhibits no distension. There is tenderness.  Mid to lower abd tenderness  Genitourinary:  Genitourinary Comments: No cva tenderness  Musculoskeletal: He exhibits no edema.  Neurological: He is alert.  Skin: Skin is warm and dry. No rash noted.  Psychiatric: He has a normal mood and affect.  Nursing note and vitals reviewed.    ED Treatments /  Results  Labs (all labs ordered are listed, but only abnormal results are displayed)  Results for orders placed or performed during the hospital encounter of 12/12/17  Comprehensive metabolic panel  Result Value Ref Range   Sodium 137 135 - 145 mmol/L   Potassium 3.0 (L) 3.5 - 5.1 mmol/L   Chloride 97 (L) 101 - 111 mmol/L   CO2 25 22 - 32 mmol/L   Glucose, Bld 120 (H) 65 - 99 mg/dL   BUN 21 (H) 6 - 20 mg/dL   Creatinine, Ser 1.37 (H) 0.61 - 1.24 mg/dL  Calcium 9.0 8.9 - 10.3 mg/dL   Total Protein 6.3 (L) 6.5 - 8.1 g/dL   Albumin 3.1 (L) 3.5 - 5.0 g/dL   AST 18 15 - 41 U/L   ALT 15 (L) 17 - 63 U/L   Alkaline Phosphatase 86 38 - 126 U/L   Total Bilirubin 0.4 0.3 - 1.2 mg/dL   GFR calc non Af Amer 51 (L) >60 mL/min   GFR calc Af Amer 59 (L) >60 mL/min   Anion gap 15 5 - 15  CBC with Differential  Result Value Ref Range   WBC 25.3 (H) 4.0 - 10.5 K/uL   RBC 4.31 4.22 - 5.81 MIL/uL   Hemoglobin 12.2 (L) 13.0 - 17.0 g/dL   HCT 37.8 (L) 39.0 - 52.0 %   MCV 87.7 78.0 - 100.0 fL   MCH 28.3 26.0 - 34.0 pg   MCHC 32.3 30.0 - 36.0 g/dL   RDW 14.6 11.5 - 15.5 %   Platelets 403 (H) 150 - 400 K/uL   Neutrophils Relative % PENDING %   Neutro Abs PENDING 1.7 - 7.7 K/uL   Band Neutrophils PENDING %   Lymphocytes Relative PENDING %   Lymphs Abs PENDING 0.7 - 4.0 K/uL   Monocytes Relative PENDING %   Monocytes Absolute PENDING 0.1 - 1.0 K/uL   Eosinophils Relative PENDING %   Eosinophils Absolute PENDING 0.0 - 0.7 K/uL   Basophils Relative PENDING %   Basophils Absolute PENDING 0.0 - 0.1 K/uL   WBC Morphology PENDING    RBC Morphology PENDING    Smear Review PENDING    nRBC PENDING 0 /100 WBC   Metamyelocytes Relative PENDING %   Myelocytes PENDING %   Promyelocytes Relative PENDING %   Blasts PENDING %  I-Stat CG4 Lactic Acid, ED  Result Value Ref Range   Lactic Acid, Venous 2.22 (HH) 0.5 - 1.9 mmol/L   Comment NOTIFIED PHYSICIAN    Ct Abdomen Pelvis Wo Contrast  Result  Date: 12/06/2017 CLINICAL DATA:  Bloody bowel movement. History of diverticulitis, fem-fem bypass, fem-pop bypass. EXAM: CT ABDOMEN AND PELVIS WITHOUT CONTRAST TECHNIQUE: Multidetector CT imaging of the abdomen and pelvis was performed following the standard protocol without IV contrast. COMPARISON:  CT abdomen and pelvis November 26, 2017 and August 17, 2017 FINDINGS: LOWER CHEST: Lung bases are clear. The visualized heart size is normal. Severe coronary artery calcifications and/or stents. No pericardial effusion. HEPATOBILIARY: Normal. PANCREAS: Nonacute.  Atrophic. SPLEEN: Normal. ADRENALS/URINARY TRACT: Kidneys are orthotopic, demonstrating normal size and morphology. Bilateral vascular calcifications. 4.1 cm benign-appearing RIGHT upper pole cyst. 1 cm cyst LEFT upper pole. Too small to characterize hypodensities bilateral kidneys. Focal scarring LEFT kidney. No nephrolithiasis, hydronephrosis; limited assessment for renal masses on this nonenhanced examination. The unopacified ureters are normal in course and caliber. Urinary bladder is well distended and unremarkable. Normal adrenal glands. STOMACH/BOWEL: Short segment of sigmoid wall thickening, large stool filled outpouching with similar surrounding fat stranding. Numerous tiny surrounding lymph nodes. Moderate sigmoid diverticulosis. Moderate amount of retained large bowel stool. Small amount of small bowel feces compatible with chronic stasis. The stomach, small bowel are normal in course and caliber without inflammatory changes, sensitivity decreased by lack of enteric contrast. Normal appendix. VASCULAR/LYMPHATIC: Aortoiliac vessels are normal in course and caliber. Severe calcific atherosclerosis. 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 No lymphadenopathy by CT size criteria. REPRODUCTIVE: Normal. OTHER: No intraperitoneal free fluid or free air. MUSCULOSKELETAL: Non-acute. Status post median sternotomy. Moderate RIGHT and small to moderate LEFT  fat containing  inguinal hernias. Surgical clips LEFT inguinal soft tissues compatible with previous vascular access. Fem-fem bypass noted. Osteopenia. IMPRESSION: 1. Similar appearance of focal sigmoid colonic wall thickening and large stool filled inflamed outpouching. Differential diagnosis includes chronic abscess, giant diverticulum, chronically perforated neoplasm. Recommend direct inspection if not imaged during proctoscopy October 24, 2017. Aortic Atherosclerosis (ICD10-I70.0). Electronically Signed   By: Elon Alas M.D.   On: 12/06/2017 02:27   Ct Chest Wo Contrast  Result Date: 11/27/2017 CLINICAL DATA:  Follow-up lung nodule, right-sided chest pain following fall 2 days ago, initial encounter EXAM: CT CHEST WITHOUT CONTRAST TECHNIQUE: Multidetector CT imaging of the chest was performed following the standard protocol without IV contrast. COMPARISON:  08/17/2017, 12/05/2016, 10/13/2015 FINDINGS: Cardiovascular: Atherosclerotic changes of the aorta are noted. Changes of prior coronary bypass grafting are seen. No cardiac enlargement is seen. No pericardial fluid is noted. Mediastinum/Nodes: Thoracic inlet is unremarkable. No hilar or mediastinal adenopathy is seen. The esophagus is unremarkable. Lungs/Pleura: Lungs are well aerated bilaterally. Scattered areas of ground-glass density are identified throughout the right lung. These are new from the prior exam. These likely represent postinflammatory change. Previously seen nodule in the right middle lobe is stable in appearance when compared with previous exams dating back to 12/05/2016. No other significant nodules are noted. Upper Abdomen: Visualized upper abdomen demonstrates right renal cyst stable from the previous exam. Musculoskeletal: Degenerative changes of the thoracic spine are noted. No acute rib abnormality is noted. IMPRESSION: Previously seen right middle lobe nodular changes are stable from previous exam dating back to 10/13/2015 Scattered  ground-glass densities are noted throughout the right lung likely postinflammatory in nature and improved from previous exams. No new focal infiltrate is seen. No acute rib abnormality is noted. Aortic Atherosclerosis (ICD10-I70.0). Electronically Signed   By: Inez Catalina M.D.   On: 11/27/2017 12:41   Ct Abdomen Pelvis W Contrast  Result Date: 11/26/2017 CLINICAL DATA:  Left lower quadrant abdominal fullness. EXAM: CT ABDOMEN AND PELVIS WITH CONTRAST TECHNIQUE: Multidetector CT imaging of the abdomen and pelvis was performed using the standard protocol following bolus administration of intravenous contrast. CONTRAST:  19mL ISOVUE-300 IOPAMIDOL (ISOVUE-300) INJECTION 61% COMPARISON:  08/17/2017 PET CT 11/30/2015.  CT abdomen 02/02/2005. FINDINGS: Lower chest: Normal Hepatobiliary: Normal Pancreas: Normal Spleen: Normal Adrenals/Urinary Tract: Adrenal glands are normal. Mild renal atrophy, left more than right. Bilateral renal cysts, the largest on the right measuring up to 4.5 cm in diameter. Extensive renal vascular calcification. Stomach/Bowel: Large amount of fecal matter in the colon. As seen previously, the patient has chronic diverticulosis throughout the sigmoid colon. At the proximal sigmoid, there is an approximately 7 cm region containing stool, in communication with the main lumen of the sigmoid colon, with mild surrounding inflammatory change. This could represent a giant diverticulum containing stool or could represent a chronic contained walled-off perforation an abscess in wide communication with sigmoid lumen. Vascular/Lymphatic: Aortic atherosclerosis. No aneurysm. Advanced aortic branch vessel atherosclerosis. Right to left femoral to femoral bypass. Reproductive: Normal Other: No free fluid or air. Musculoskeletal: Negative IMPRESSION: The area of clinical concern corresponds to an approximately 7 cm in diameter abnormality arising from the sigmoid colon which contains stool and contrast,  apparently in wide communication with the sigmoid colon. The differential diagnosis is giant diverticulum versus chronic walled-off communicating cavity related to previous diverticulitis and abscess. Patient does have extensive diverticulosis of the sigmoid colon in that region without definite acute diverticulitis. The finding is similar to previous scans  going back as far as April of 2017. Advanced aortic atherosclerosis and atherosclerosis of the branch vessels. Electronically Signed   By: Nelson Chimes M.D.   On: 11/26/2017 14:24    EKG None  Radiology No results found.  Procedures Procedures (including critical care time)  Medications Ordered in ED Medications  piperacillin-tazobactam (ZOSYN) IVPB 3.375 g (has no administration in time range)  vancomycin (VANCOCIN) IVPB 1000 mg/200 mL premix (has no administration in time range)  sodium chloride 0.9 % bolus 1,000 mL (has no administration in time range)  acetaminophen (TYLENOL) tablet 650 mg (650 mg Oral Given 12/12/17 1113)     Initial Impression / Assessment and Plan / ED Course  I have reviewed the triage vital signs and the nursing notes.  Pertinent labs & imaging results that were available during my care of the patient were reviewed by me and considered in my medical decision making (see chart for details).  Iv ns bolus. Cultures. Labs. Continuous pulse ox and monitor. Stat labs.   Iv abx given. CT scan.  Labs reviewed - lactate is high, wbc very high - concerning for sepsis.   Ct reviewed - divert mass/?abscess/?infection.  Iv ns fluid bolus, re bp soft.   Stat iv abx given re concern sepsis.   Reviewed nursing notes and prior charts for additional history.  On review of prior charts, its unclear why definitive management of issue delayed, as it seems patient may be at risk of recurrent infxn/sepsis.   General surgeon consulted - they indicate will consult, but request med service admit for iv abx, multiple med  issues.   On hospitalists inpatient service at [facility] 5 days ago, therefore will consult hospitalists for admission.  CRITICAL CARE  RE acute sepsis, elevated lactate, aki,  Performed by: Mirna Mires Total critical care time: 35 minutes Critical care time was exclusive of separately billable procedures and treating other patients. Critical care was necessary to treat or prevent imminent or life-threatening deterioration. Critical care was time spent personally by me on the following activities: development of treatment plan with patient and/or surrogate as well as nursing, discussions with consultants, evaluation of patient's response to treatment, examination of patient, obtaining history from patient or surrogate, ordering and performing treatments and interventions, ordering and review of laboratory studies, ordering and review of radiographic studies, pulse oximetry and re-evaluation of patient's condition.    Final Clinical Impressions(s) / ED Diagnoses   Final diagnoses:  None    ED Discharge Orders    None           Lajean Saver, MD 01/11/18 1654

## 2017-12-12 NOTE — Telephone Encounter (Signed)
PATIENT DAUGHTER WANTED TO LET YOU KNOW THAT HE IS ON THE WAY TO THE HOSPITAL BECAUSE HIS BP IS 130/178

## 2017-12-12 NOTE — Telephone Encounter (Signed)
Noted. Juluis Rainier will route to provider.

## 2017-12-13 ENCOUNTER — Inpatient Hospital Stay (HOSPITAL_COMMUNITY): Payer: Medicare Other | Admitting: Anesthesiology

## 2017-12-13 ENCOUNTER — Encounter (HOSPITAL_COMMUNITY): Payer: Self-pay | Admitting: Certified Registered"

## 2017-12-13 ENCOUNTER — Encounter (HOSPITAL_COMMUNITY)
Admission: EM | Disposition: A | Discharge status: Home Health | Payer: Self-pay | Source: Home / Self Care | Attending: Internal Medicine

## 2017-12-13 DIAGNOSIS — Z01810 Encounter for preprocedural cardiovascular examination: Secondary | ICD-10-CM

## 2017-12-13 DIAGNOSIS — A419 Sepsis, unspecified organism: Principal | ICD-10-CM

## 2017-12-13 DIAGNOSIS — E876 Hypokalemia: Secondary | ICD-10-CM

## 2017-12-13 DIAGNOSIS — K659 Peritonitis, unspecified: Secondary | ICD-10-CM

## 2017-12-13 HISTORY — PX: COLOSTOMY: SHX63

## 2017-12-13 HISTORY — PX: COLON RESECTION: SHX5231

## 2017-12-13 LAB — GLUCOSE, CAPILLARY
GLUCOSE-CAPILLARY: 84 mg/dL (ref 65–99)
GLUCOSE-CAPILLARY: 112 mg/dL — AB (ref 65–99)
Glucose-Capillary: 106 mg/dL — ABNORMAL HIGH (ref 65–99)
Glucose-Capillary: 106 mg/dL — ABNORMAL HIGH (ref 65–99)
Glucose-Capillary: 127 mg/dL — ABNORMAL HIGH (ref 65–99)
Glucose-Capillary: 76 mg/dL (ref 65–99)

## 2017-12-13 LAB — CBC
HEMATOCRIT: 30.1 % — AB (ref 39.0–52.0)
Hemoglobin: 9.6 g/dL — ABNORMAL LOW (ref 13.0–17.0)
MCH: 28 pg (ref 26.0–34.0)
MCHC: 31.9 g/dL (ref 30.0–36.0)
MCV: 87.8 fL (ref 78.0–100.0)
Platelets: 312 10*3/uL (ref 150–400)
RBC: 3.43 MIL/uL — ABNORMAL LOW (ref 4.22–5.81)
RDW: 15.1 % (ref 11.5–15.5)
WBC: 8.8 10*3/uL (ref 4.0–10.5)

## 2017-12-13 LAB — BASIC METABOLIC PANEL
Anion gap: 7 (ref 5–15)
BUN: 16 mg/dL (ref 6–20)
CO2: 24 mmol/L (ref 22–32)
Calcium: 7.9 mg/dL — ABNORMAL LOW (ref 8.9–10.3)
Chloride: 105 mmol/L (ref 101–111)
Creatinine, Ser: 1.11 mg/dL (ref 0.61–1.24)
GFR calc Af Amer: >60 mL/min (ref >60)
GLUCOSE: 78 mg/dL (ref 65–99)
POTASSIUM: 3.5 mmol/L (ref 3.5–5.1)
Sodium: 136 mmol/L (ref 135–145)

## 2017-12-13 LAB — URINE CULTURE: Culture: NO GROWTH

## 2017-12-13 LAB — LACTIC ACID, PLASMA: Lactic Acid, Venous: 1.8 mmol/L (ref 0.5–1.9)

## 2017-12-13 LAB — TYPE AND SCREEN
ABO/RH(D): A POS
ANTIBODY SCREEN: NEGATIVE

## 2017-12-13 LAB — CEA: CEA1: 2 ng/mL (ref 0.0–4.7)

## 2017-12-13 SURGERY — COLON RESECTION
Anesthesia: General | Site: Abdomen

## 2017-12-13 MED ORDER — PHENYLEPHRINE 40 MCG/ML (10ML) SYRINGE FOR IV PUSH (FOR BLOOD PRESSURE SUPPORT)
PREFILLED_SYRINGE | INTRAVENOUS | Status: AC
  Filled 2017-12-13: qty 10

## 2017-12-13 MED ORDER — OXYCODONE HCL 5 MG PO TABS
ORAL_TABLET | ORAL | Status: AC
  Filled 2017-12-13: qty 1

## 2017-12-13 MED ORDER — PROPOFOL 10 MG/ML IV BOLUS
INTRAVENOUS | Status: DC | PRN
  Administered 2017-12-13: 130 mg via INTRAVENOUS

## 2017-12-13 MED ORDER — HYDROMORPHONE HCL 2 MG/ML IJ SOLN
INTRAMUSCULAR | Status: AC
  Filled 2017-12-13: qty 1

## 2017-12-13 MED ORDER — LACTATED RINGERS IV SOLN
INTRAVENOUS | Status: DC
  Administered 2017-12-13 (×2): via INTRAVENOUS

## 2017-12-13 MED ORDER — MIDAZOLAM HCL 5 MG/5ML IJ SOLN
INTRAMUSCULAR | Status: DC | PRN
  Administered 2017-12-13: 2 mg via INTRAVENOUS

## 2017-12-13 MED ORDER — PROPOFOL 10 MG/ML IV BOLUS
INTRAVENOUS | Status: AC
  Filled 2017-12-13: qty 20

## 2017-12-13 MED ORDER — PROMETHAZINE HCL 25 MG/ML IJ SOLN: 6.2500 mg | INTRAMUSCULAR | Status: DC | PRN

## 2017-12-13 MED ORDER — OXYCODONE HCL 5 MG PO TABS: 5.0000 mg | ORAL_TABLET | Freq: Once | ORAL | Status: DC | PRN

## 2017-12-13 MED ORDER — ROCURONIUM BROMIDE 10 MG/ML (PF) SYRINGE
PREFILLED_SYRINGE | INTRAVENOUS | Status: DC | PRN
  Administered 2017-12-13 (×2): 20 mg via INTRAVENOUS
  Administered 2017-12-13: 50 mg via INTRAVENOUS

## 2017-12-13 MED ORDER — 0.9 % SODIUM CHLORIDE (POUR BTL) OPTIME
TOPICAL | Status: DC | PRN
  Administered 2017-12-13 (×3): 1000 mL

## 2017-12-13 MED ORDER — SUGAMMADEX SODIUM 500 MG/5ML IV SOLN
INTRAVENOUS | Status: AC
  Filled 2017-12-13: qty 5

## 2017-12-13 MED ORDER — MIDAZOLAM HCL 2 MG/2ML IJ SOLN
INTRAMUSCULAR | Status: AC
  Filled 2017-12-13: qty 2

## 2017-12-13 MED ORDER — ONDANSETRON HCL 4 MG/2ML IJ SOLN
INTRAMUSCULAR | Status: DC | PRN
  Administered 2017-12-13: 4 mg via INTRAVENOUS

## 2017-12-13 MED ORDER — PHENYLEPHRINE 40 MCG/ML (10ML) SYRINGE FOR IV PUSH (FOR BLOOD PRESSURE SUPPORT)
PREFILLED_SYRINGE | INTRAVENOUS | Status: DC | PRN
  Administered 2017-12-13: 80 ug via INTRAVENOUS

## 2017-12-13 MED ORDER — ONDANSETRON HCL 4 MG/2ML IJ SOLN
INTRAMUSCULAR | Status: AC
  Filled 2017-12-13: qty 2

## 2017-12-13 MED ORDER — POTASSIUM CHLORIDE IN NACL 20-0.9 MEQ/L-% IV SOLN
INTRAVENOUS | Status: DC
  Administered 2017-12-13: 16:00:00 via INTRAVENOUS
  Administered 2017-12-14: 1 mL via INTRAVENOUS
  Filled 2017-12-13 (×3): qty 1000

## 2017-12-13 MED ORDER — SUGAMMADEX SODIUM 500 MG/5ML IV SOLN
INTRAVENOUS | Status: DC | PRN
  Administered 2017-12-13: 300 mg via INTRAVENOUS

## 2017-12-13 MED ORDER — LABETALOL HCL 5 MG/ML IV SOLN
INTRAVENOUS | Status: DC | PRN
  Administered 2017-12-13: 5 mg via INTRAVENOUS
  Administered 2017-12-13: 10 mg via INTRAVENOUS

## 2017-12-13 MED ORDER — FENTANYL CITRATE (PF) 100 MCG/2ML IJ SOLN
INTRAMUSCULAR | Status: DC | PRN
  Administered 2017-12-13: 50 ug via INTRAVENOUS
  Administered 2017-12-13: 100 ug via INTRAVENOUS
  Administered 2017-12-13 (×2): 50 ug via INTRAVENOUS
  Administered 2017-12-13: 100 ug via INTRAVENOUS
  Administered 2017-12-13: 50 ug via INTRAVENOUS
  Administered 2017-12-13: 100 ug via INTRAVENOUS

## 2017-12-13 MED ORDER — FENTANYL CITRATE (PF) 250 MCG/5ML IJ SOLN
INTRAMUSCULAR | Status: AC
  Filled 2017-12-13: qty 5

## 2017-12-13 MED ORDER — METHYLENE BLUE 0.5 % INJ SOLN
INTRAVENOUS | Status: AC
  Filled 2017-12-13: qty 10

## 2017-12-13 MED ORDER — OXYCODONE HCL 5 MG PO TABS
ORAL_TABLET | ORAL | Status: AC
  Administered 2017-12-13: 10 mg via ORAL
  Filled 2017-12-13: qty 1

## 2017-12-13 MED ORDER — OXYCODONE HCL 5 MG/5ML PO SOLN: 5.0000 mg | Freq: Once | ORAL | Status: DC | PRN

## 2017-12-13 MED ORDER — HYDROMORPHONE HCL 2 MG/ML IJ SOLN
0.2500 mg | INTRAMUSCULAR | Status: DC | PRN
  Administered 2017-12-13 (×2): 0.5 mg via INTRAVENOUS

## 2017-12-13 MED ORDER — DEXAMETHASONE SODIUM PHOSPHATE 10 MG/ML IJ SOLN
INTRAMUSCULAR | Status: AC
  Filled 2017-12-13: qty 1

## 2017-12-13 MED ORDER — DEXAMETHASONE SODIUM PHOSPHATE 10 MG/ML IJ SOLN
INTRAMUSCULAR | Status: DC | PRN
  Administered 2017-12-13: 10 mg via INTRAVENOUS

## 2017-12-13 MED ORDER — LABETALOL HCL 5 MG/ML IV SOLN
INTRAVENOUS | Status: AC
  Filled 2017-12-13: qty 4

## 2017-12-13 MED ORDER — LIDOCAINE 2% (20 MG/ML) 5 ML SYRINGE
INTRAMUSCULAR | Status: AC
  Filled 2017-12-13: qty 5

## 2017-12-13 MED ORDER — METHYLENE BLUE 0.5 % INJ SOLN
INTRAVENOUS | Status: DC | PRN
  Administered 2017-12-13: 2 mg via INTRAVENOUS

## 2017-12-13 MED ORDER — LIDOCAINE 2% (20 MG/ML) 5 ML SYRINGE
INTRAMUSCULAR | Status: DC | PRN
  Administered 2017-12-13: 80 mg via INTRAVENOUS

## 2017-12-13 MED ORDER — OXYCODONE HCL 5 MG PO TABS
5.0000 mg | ORAL_TABLET | ORAL | Status: DC | PRN
  Administered 2017-12-13 – 2017-12-17 (×5): 10 mg via ORAL
  Filled 2017-12-13 (×4): qty 2

## 2017-12-13 SURGICAL SUPPLY — 51 items
BLADE CLIPPER SURG (BLADE) ×2 IMPLANT
CANISTER SUCT 3000ML PPV (MISCELLANEOUS) ×3 IMPLANT
CHLORAPREP W/TINT 26ML (MISCELLANEOUS) ×3 IMPLANT
COVER MAYO STAND STRL (DRAPES) ×1 IMPLANT
COVER SURGICAL LIGHT HANDLE (MISCELLANEOUS) ×4 IMPLANT
DRAPE HALF SHEET 40X57 (DRAPES) ×1 IMPLANT
DRAPE LAPAROSCOPIC ABDOMINAL (DRAPES) ×3 IMPLANT
DRAPE UTILITY XL STRL (DRAPES) ×3 IMPLANT
DRAPE WARM FLUID 44X44 (DRAPE) ×3 IMPLANT
DRSG OPSITE POSTOP 4X10 (GAUZE/BANDAGES/DRESSINGS) ×2 IMPLANT
DRSG OPSITE POSTOP 4X8 (GAUZE/BANDAGES/DRESSINGS) IMPLANT
ELECT BLADE 6.5 EXT (BLADE) ×2 IMPLANT
ELECT CAUTERY BLADE 6.4 (BLADE) ×6 IMPLANT
ELECT REM PT RETURN 9FT ADLT (ELECTROSURGICAL) ×3
ELECTRODE REM PT RTRN 9FT ADLT (ELECTROSURGICAL) ×1 IMPLANT
GLOVE BIO SURGEON STRL SZ7 (GLOVE) ×6 IMPLANT
GLOVE BIOGEL PI IND STRL 7.5 (GLOVE) ×2 IMPLANT
GLOVE BIOGEL PI INDICATOR 7.5 (GLOVE) ×4
GOWN STRL REUS W/ TWL LRG LVL3 (GOWN DISPOSABLE) ×6 IMPLANT
GOWN STRL REUS W/TWL LRG LVL3 (GOWN DISPOSABLE) ×18
KIT BASIN OR (CUSTOM PROCEDURE TRAY) ×3 IMPLANT
KIT OSTOMY DRAINABLE 2.75 STR (WOUND CARE) ×2 IMPLANT
KIT TURNOVER KIT B (KITS) ×3 IMPLANT
LEGGING LITHOTOMY PAIR STRL (DRAPES) IMPLANT
LIGASURE IMPACT 36 18CM CVD LR (INSTRUMENTS) ×2 IMPLANT
NS IRRIG 1000ML POUR BTL (IV SOLUTION) ×6 IMPLANT
PACK GENERAL/GYN (CUSTOM PROCEDURE TRAY) ×3 IMPLANT
PAD ARMBOARD 7.5X6 YLW CONV (MISCELLANEOUS) ×3 IMPLANT
PENCIL BUTTON HOLSTER BLD 10FT (ELECTRODE) ×1 IMPLANT
RELOAD PROXIMATE 75MM BLUE (ENDOMECHANICALS) ×3 IMPLANT
RELOAD STAPLE 75 3.8 BLU REG (ENDOMECHANICALS) IMPLANT
SPONGE LAP 18X18 X RAY DECT (DISPOSABLE) ×4 IMPLANT
STAPLER PROXIMATE 75MM BLUE (STAPLE) ×2 IMPLANT
STAPLER VISISTAT 35W (STAPLE) ×3 IMPLANT
SUCTION POOLE TIP (SUCTIONS) ×3 IMPLANT
SURGILUBE 2OZ TUBE FLIPTOP (MISCELLANEOUS) IMPLANT
SUT PDS AB 1 TP1 96 (SUTURE) ×6 IMPLANT
SUT PROLENE 2 0 CT2 30 (SUTURE) ×2 IMPLANT
SUT PROLENE 2 0 KS (SUTURE) IMPLANT
SUT SILK 2 0 SH CR/8 (SUTURE) ×3 IMPLANT
SUT SILK 2 0 TIES 10X30 (SUTURE) ×3 IMPLANT
SUT SILK 3 0 SH CR/8 (SUTURE) ×3 IMPLANT
SUT SILK 3 0 TIES 10X30 (SUTURE) ×3 IMPLANT
SUT VIC AB 3-0 SH 18 (SUTURE) ×2 IMPLANT
SYR BULB IRRIGATION 50ML (SYRINGE) ×1 IMPLANT
TOWEL OR 17X26 10 PK STRL BLUE (TOWEL DISPOSABLE) ×4 IMPLANT
TRAY FOLEY MTR SLVR 16FR STAT (SET/KITS/TRAYS/PACK) ×2 IMPLANT
TRAY PROCTOSCOPIC FIBER OPTIC (SET/KITS/TRAYS/PACK) IMPLANT
TUBE CONNECTING 12'X1/4 (SUCTIONS)
TUBE CONNECTING 12X1/4 (SUCTIONS) ×1 IMPLANT
YANKAUER SUCT BULB TIP NO VENT (SUCTIONS) ×1 IMPLANT

## 2017-12-13 NOTE — Anesthesia Postprocedure Evaluation (Signed)
Anesthesia Post Note  Patient: [name]  Procedure(s) Performed: EXPLORATORY LAPAROTOMY, SIGMOID COLECTOMY WITH COLOSTOMY (N/A Abdomen) COLOSTOMY (N/A Abdomen)     Patient location during evaluation: PACU Anesthesia Type: General Level of consciousness: awake and alert Pain management: pain level controlled Vital Signs Assessment: post-procedure vital signs reviewed and stable Respiratory status: spontaneous breathing, nonlabored ventilation and respiratory function stable Cardiovascular status: blood pressure returned to baseline and stable Postop Assessment: no apparent nausea or vomiting Anesthetic complications: no    Last Vitals:  Vitals:   12/13/17 1425 12/13/17 1430  BP:    Pulse: 71 81  Resp: 13 12  Temp:    SpO2: 96% 94%    Last Pain:  Vitals:   12/13/17 1410  TempSrc:   PainSc: [place]

## 2017-12-13 NOTE — Anesthesia Procedure Notes (Signed)
Procedure Name: Intubation Date/Time: 12/13/2017 10:17 AM Performed by: Moshe Salisbury, CRNA Pre-anesthesia Checklist: Patient identified, Emergency Drugs available, Suction available and Patient being monitored Patient Re-evaluated:Patient Re-evaluated prior to induction Oxygen Delivery Method: Circle System Utilized Preoxygenation: Pre-oxygenation with 100% oxygen Induction Type: IV induction Ventilation: Mask ventilation without difficulty Laryngoscope Size: Mac and 4 Grade View: Grade II Tube type: Oral Tube size: 8.0 mm Number of attempts: 1 Airway Equipment and Method: Stylet and Oral airway Placement Confirmation: ETT inserted through vocal cords under direct vision,  positive ETCO2 and breath sounds checked- equal and bilateral Secured at: 22 cm Tube secured with: Tape Dental Injury: Teeth and Oropharynx as per pre-operative assessment

## 2017-12-13 NOTE — Progress Notes (Signed)
PROGRESS NOTE   Attending MD note  Patient was seen, examined,treatment plan was discussed with the PA-S.  I have personally reviewed the clinical findings, lab, imaging studies and management of this patient in detail. I agree with the documentation, as recorded by the PA-S  Patient is a 72 year old male with history of coronary artery disease status post CABG, hypertension, hyperlipidemia, type 2 diabetes mellitus, peripheral vascular disease status post femoral-popliteal bypass who has been having recurrent left lower quadrant abdominal pain, was diagnosed with diverticulitis and has had multiple evaluations in the recent past including a hospitalization, he was treated conservatively with antibiotics and despite that his symptoms have remained persistent and has now been admitted to the hospital again.  General surgery recommends to be taken to the OR today.  Cardiology consulted yesterday, no further work-up recommended prior to surgery  On Exam: Gen. exam: Awake, alert, not in any distress Chest: Good air entry bilaterally, no rhonchi or rales CVS: S1-S2 regular, no murmurs Abdomen: Soft, nontender and nondistended Neurology: Non-focal Skin: No rash or lesions  Plan  Sepsis associated with apparent diverticular disease / diverticulitis -Patient has been started on Zosyn, continue, appreciate general surgery input, will be taken to the operating room today.  Tomorrow processes on the differential, will await operative note.  Discussed with Dr. Georgette Dover today at bedside  Hypokalemia -Repleting IV, recheck potassium tomorrow morning  Type 2 diabetes mellitus -Continue Lantus and sliding scale  Hypertension -IV PRN's  Coronary artery disease status post CABG -No chest pain, fairly good activity level, no orthopnea/fluid overload  Peripheral vascular disease -Status post femorofemoral bypass and femoropopliteal bypass, appears stable   Rest as above  [name] M. Cruzita Lederer,  MD [facility] [phone]  If 7PM-7AM, please contact night-coverage [email or web address] Password [email or web address]     [name]  FWY:[id number] DOB: 01-30-1946 DOA: 12/12/2017 PCP: [name], MD    Brief Narrative:  [name] is a 72 year old male who presents with fever/weakness/presyncope with recurrent left lower quadrant abdominal pain, progressive weight loss and recent large bright red bloody bowel movements. Past medical history is significant for CAD with history of stents and CABG (2017), hypertension, dyslipidemia, type 2 diabetes, peripheral vascular disease s/p femoral/popliteal bypass, GERD and diverticulitis. Patient has been admitted multiple times since October for similar symptoms, recently discharged from [facility] on 4/26 and was sent home on antibiotics. On admission patient was found to be septic with tachycardia, tachypnea and leukocytosis WBC 25.8, Lactic acid 2.22. CT of the abdomen showed large sigmoid outpouching with inflammatory change concerning for giant sigmoid diverticulum. CXR unremarkable.  Patient was admitted with a working diagnosis of sepsis secondary to diverticulitis.    Assessment & Plan:   Principal Problem:   Sepsis ([place]) Active Problems:   Diverticulitis   Hypertension   Diabetes mellitus without complication ([place])   CKD (chronic kidney disease), stage III ([place])   Hypercholesteremia   CAD S/P percutaneous coronary angioplasty   Hx of CABG   Acute hypokalemia   Preoperative cardiovascular examination   Sepsis secondary to diverticulitis -Today labs are stable, WBC 8.8, Lactic acid 1.8, Hgb stable at 9.6, BMP within normal limits, vital signs stable. -Patient has been having recurring episodes of sepsis secondary to diverticulitis requiring hospital admission beginning in October of 2018. -Cardiology and general surgery on board and much appreciated. -Cardiology has cleared the patient for surgery and as per general  surgery's recommendation he will be undergoing a proctosigmoidectomy with sigmoid colectomy/descending  colostomy to resect the diseased portion of the colon today on 5/2. -Blood and urine cultures pending. -CEA ordered to evaluate for possible malignancy normal ar 2.0. -Continue IV Zosyn started on 5/1.  Hypertension -Blood pressure stable 122/57 mmHg. -Continue low dose IV Lopressor but hold if SBP </=90  Diabetes mellitus type 2 -Continue Lantus 20 units nightly -Follow CBGs and continue SSI.  Chronic Kidney Disease stage III -Stable, GFR >60, creatinine 1.11. -Continue to monitor.  Acute Hypokalemia -Repleated and now stable at 3.5 today. -Continue replenishing via continuous 0.9% NaCl with KCl 20 mEq IV.  Hypercholesteremia -Hold home meds, continue to monitor.  DVT prophylaxis: SCD's Code Status: FULL Family Communication: Wife and daughter at bedside. Disposition Plan: Home when clinically improved.   Consultants:   General surgery  Cardiology  Procedures:   None.  Antimicrobials:  Zosyn 5/1>>  Cefotetan 5/1 > 5/1  Vancomycin 5/1 > 5/1   Subjective: Patient denies fever, nausea, vomiting, diarrhea but reports continuing lower left quadrant abdominal tenderness. Denies shortness of breath, chest pain, confusion or headaches.  Objective: Vitals:   12/12/17 2338 12/13/17 0418 12/13/17 0500 12/13/17 0950  BP: (!) 122/59 (!) 107/54 (!) 122/57   Pulse: 70 83 86   Resp:  18    Temp:  98 F (36.7 C)    TempSrc:  Oral    SpO2: 99% 95% 97%   Weight:    65.3 kg (144 lb)  Height:    5\' 6"  (1.676 m)    Intake/Output Summary (Last 24 hours) at 12/13/2017 1007 Last data filed at 12/13/2017 0830 Gross per 24 hour  Intake 3355.42 ml  Output 1051 ml  Net 2304.42 ml   Filed Weights   12/12/17 1108 12/13/17 0950  Weight: 65.3 kg (144 lb) 65.3 kg (144 lb)    Examination:  General exam: Appears calm and comfortable  Respiratory system: Clear to  auscultation. Respiratory effort normal. Cardiovascular system: S1 & S2 heard, RRR. No murmurs, rubs, gallops or clicks. No pedal edema. Gastrointestinal system: Abdomen is nondistended, soft and mildly tender. Normal bowel sounds heard. Central nervous system: Alert and oriented. No focal neurological deficits. Extremities: Moves all four extremities. Skin: No rashes, lesions or ulcers. Psychiatry: Judgement and insight appear normal. Mood & affect appropriate.   Data Reviewed: I have personally reviewed following labs and imaging studies  CBC: Recent Labs  Lab 12/06/17 1659 12/06/17 2152 12/07/17 0413 12/12/17 1110 12/13/17 0454  WBC  --   --  8.5 25.3* 8.8  NEUTROABS  --   --   --  24.3*  --   HGB 11.0* 11.2* 11.7* 12.2* 9.6*  HCT 34.4* 34.9* 37.3* 37.8* 30.1*  MCV  --   --  87.6 87.7 87.8  PLT  --   --  347 403* 485   Basic Metabolic Panel: Recent Labs  Lab 12/07/17 0413 12/12/17 1110 12/13/17 0454  NA 137 137 136  K 3.9 3.0* 3.5  CL 98* 97* 105  CO2 28 25 24   GLUCOSE 80 120* 78  BUN 28* 21* 16  CREATININE 1.17 1.37* 1.11  CALCIUM 8.7* 9.0 7.9*   GFR: Estimated Creatinine Clearance: 55.9 mL/min (by C-G formula based on SCr of 1.11 mg/dL). Liver Function Tests: Recent Labs  Lab 12/12/17 1110  AST 18  ALT 15*  ALKPHOS 86  BILITOT 0.4  PROT 6.3*  ALBUMIN 3.1*   No results for input(s): LIPASE, AMYLASE in the last 168 hours. No results for input(s): AMMONIA in the  last 168 hours. Coagulation Profile: No results for input(s): INR, PROTIME in the last 168 hours. Cardiac Enzymes: No results for input(s): CKTOTAL, CKMB, CKMBINDEX, TROPONINI in the last 168 hours. BNP (last 3 results) No results for input(s): PROBNP in the last 8760 hours. HbA1C: No results for input(s): HGBA1C in the last 72 hours. CBG: Recent Labs  Lab 12/12/17 1604 12/12/17 1949 12/13/17 0043 12/13/17 0357 12/13/17 0805  GLUCAP 136* 83 106* 76 84   Lipid Profile: No results for  input(s): CHOL, HDL, LDLCALC, TRIG, CHOLHDL, LDLDIRECT in the last 72 hours. Thyroid Function Tests: No results for input(s): TSH, T4TOTAL, FREET4, T3FREE, THYROIDAB in the last 72 hours. Anemia Panel: No results for input(s): VITAMINB12, FOLATE, FERRITIN, TIBC, IRON, RETICCTPCT in the last 72 hours. Sepsis Labs: Recent Labs  Lab 12/12/17 1114 12/12/17 1643 12/12/17 2258  LATICACIDVEN 2.22* 2.08* 1.8    Recent Results (from the past 240 hour(s))  MRSA PCR Screening     Status: None   Collection Time: 12/06/17  5:57 AM  Result Value Ref Range Status   MRSA by PCR NEGATIVE NEGATIVE Final    Comment:        The GeneXpert MRSA Assay (FDA approved for NASAL specimens only), is one component of a comprehensive MRSA colonization surveillance program. It is not intended to diagnose MRSA infection nor to guide or monitor treatment for MRSA infections. Performed at [facility], [address] [place],  [address]          Radiology Studies: Ct Abdomen Pelvis Wo Contrast  Result Date: 12/12/2017 CLINICAL DATA:  Abdominal pain and fever, abscess suspected EXAM: CT ABDOMEN AND PELVIS WITHOUT CONTRAST TECHNIQUE: Multidetector CT imaging of the abdomen and pelvis was performed following the standard protocol without IV contrast. COMPARISON:  12/06/2017 CT FINDINGS: Lower chest: Top-normal size heart with coronary arteriosclerosis and aortic atherosclerosis. No pericardial effusion. Dependent atelectasis is identified at the lung bases. Subsegmental atelectasis and/or scarring is seen in the lingula. Hepatobiliary: The liver is unremarkable for a noncontrast study. No definite space-occupying mass is identified. There is no biliary dilatation. Gallbladder is physiologically distended. Punctate calculus is seen along the dependent wall. No secondary signs of acute cholecystitis. Pancreas: Atrophic pancreas without ductal dilatation. Spleen: Normal size spleen without acute abnormality.  Adrenals/Urinary Tract: Normal bilateral adrenal glands. Stable bilateral renal cysts and renovascular calcifications within both kidneys. Dense atherosclerotic calcifications at the origins both renal arteries. No significant cortical scarring. No nephrolithiasis nor obstructive uropathy. The largest simple cyst is seen in the upper pole of the right kidney measuring 4.5 cm with largest cyst in the lower pole the left kidney measuring 1.1 cm. No significant change. There is minimal cortical scarring of the left kidney. The urinary bladder is physiologically distended without focal mural thickening or calculi. Stomach/Bowel: Redemonstration of short segmental thickening of the sigmoid with large giant diverticulum suspected containing stool off the affected sigmoid. Mild fat stranding is seen about the presumed large diverticulum. Moderate sigmoid diverticulosis is otherwise noted with a moderate amount of retained stool and contrast in the colon. No small bowel dilatation or inflammation. Decompressed stomach is demonstrated. Vascular/Lymphatic: Marked aortoiliac and branch vessel atherosclerosis without aneurysm. Chronic infrarenal aortic dissection. No lymphadenopathy by CT size criteria. A few small mesenteric nodes are seen adjacent to the large sigmoid outpouching Reproductive: Normal size prostate with a few peripheral zone calcifications. Other: No intraperitoneal free fluid nor free air. Musculoskeletal: Status post median sternotomy. Moderate right and small to moderate left  fat containing inguinal hernias with surgical clips in the left inguinal soft tissues compatible with prior vascular access. Bilateral fem-fem bypass graft noted. IMPRESSION: 1. Redemonstration of large sigmoid outpouching with inflammatory change similar to prior without evidence of free intraperitoneal air, fluid or bowel obstruction. Differential considerations remain inflamed giant sigmoid diverticulum, stigmata of a chronic  abscess though unchanged in appearance from recent comparison, ulcerated neoplasm among some possibilities though not exclusive. Favor giant sigmoid diverticulum. No significant progression however is seen since prior. 2. Marked aortoiliac and branch vessel atherosclerosis. 3. Stable bilateral renal cysts. 4. Punctate calculus suspected along the dependent wall of the gallbladder on current study. Electronically Signed   By: Ashley Royalty M.D.   On: 12/12/2017 18:33   Dg Chest Port 1 View  Result Date: 12/12/2017 CLINICAL DATA:  Leukocytosis EXAM: PORTABLE CHEST 1 VIEW COMPARISON:  10/01/2017 FINDINGS: Postop CABG without heart failure or edema. No pleural effusion. Negative for pneumonia IMPRESSION: No active disease. Electronically Signed   By: Franchot Gallo M.D.   On: 12/12/2017 13:49        Scheduled Meds: . [MAR Hold] cilostazol  100 mg Oral BID  . [MAR Hold] insulin aspart  0-9 Units Subcutaneous Q4H  . [MAR Hold] insulin glargine  20 Units Subcutaneous QHS  . [MAR Hold] metoprolol tartrate  5 mg Intravenous Q8H  . [MAR Hold] polyethylene glycol powder  1 Container Oral Once  . [MAR Hold] sodium chloride flush  3 mL Intravenous Q12H   Continuous Infusions: . 0.9 % NaCl with KCl 20 mEq / L 125 mL/hr at 12/13/17 0539  . lactated ringers 10 mL/hr at 12/13/17 0955  . [MAR Hold] piperacillin-tazobactam (ZOSYN)  IV 3.375 g (12/13/17 0540)     LOS: 1 day    Time spent: 25 minutes.    Eloy End, PA-S [facility]  If 7PM-7AM, please contact night-coverage [email or web address] Password [email or web address] 12/13/2017, 10:07 AM

## 2017-12-13 NOTE — Progress Notes (Signed)
Day of Surgery   Subjective/Chief Complaint: Examined patient and discussed upcoming surgery Still with some LLQ tenderness WBC/ Hgb look significantly different than yesterday   Objective: Vital signs in last 24 hours: Temp:  [97.7 F (36.5 C)-99.9 F (37.7 C)] 98 F (36.7 C) (05/02 0418) Pulse Rate:  [64-136] 86 (05/02 0500) Resp:  [10-25] 18 (05/02 0418) BP: (107-138)/(50-89) 122/57 (05/02 0500) SpO2:  [93 %-100 %] 97 % (05/02 0500) Weight:  [65.3 kg (144 lb)] 65.3 kg (144 lb) (05/01 1108) Last BM Date: 12/13/17  Intake/Output from previous day: 05/01 0701 - 05/02 0700 In: 3355.4 [P.O.:120; I.V.:1735.4; IV Piggyback:1500] Out: 851 [Urine:850; Stool:1] Intake/Output this shift: No intake/output data recorded.  Abd - soft, non-distended, tender in LLQ - palpable fullness, small umbilical hernia  Lab Results:  Recent Labs    12/12/17 1110 12/13/17 0454  WBC 25.3* 8.8  HGB 12.2* 9.6*  HCT 37.8* 30.1*  PLT 403* 312   BMET Recent Labs    12/12/17 1110 12/13/17 0454  NA 137 136  K 3.0* 3.5  CL 97* 105  CO2 25 24  GLUCOSE 120* 78  BUN 21* 16  CREATININE 1.37* 1.11  CALCIUM 9.0 7.9*   PT/INR No results for input(s): LABPROT, INR in the last 72 hours. ABG No results for input(s): PHART, HCO3 in the last 72 hours.  Invalid input(s): PCO2, PO2  Studies/Results: Ct Abdomen Pelvis Wo Contrast  Result Date: 12/12/2017 CLINICAL DATA:  Abdominal pain and fever, abscess suspected EXAM: CT ABDOMEN AND PELVIS WITHOUT CONTRAST TECHNIQUE: Multidetector CT imaging of the abdomen and pelvis was performed following the standard protocol without IV contrast. COMPARISON:  12/06/2017 CT FINDINGS: Lower chest: Top-normal size heart with coronary arteriosclerosis and aortic atherosclerosis. No pericardial effusion. Dependent atelectasis is identified at the lung bases. Subsegmental atelectasis and/or scarring is seen in the lingula. Hepatobiliary: The liver is unremarkable for a  noncontrast study. No definite space-occupying mass is identified. There is no biliary dilatation. Gallbladder is physiologically distended. Punctate calculus is seen along the dependent wall. No secondary signs of acute cholecystitis. Pancreas: Atrophic pancreas without ductal dilatation. Spleen: Normal size spleen without acute abnormality. Adrenals/Urinary Tract: Normal bilateral adrenal glands. Stable bilateral renal cysts and renovascular calcifications within both kidneys. Dense atherosclerotic calcifications at the origins both renal arteries. No significant cortical scarring. No nephrolithiasis nor obstructive uropathy. The largest simple cyst is seen in the upper pole of the right kidney measuring 4.5 cm with largest cyst in the lower pole the left kidney measuring 1.1 cm. No significant change. There is minimal cortical scarring of the left kidney. The urinary bladder is physiologically distended without focal mural thickening or calculi. Stomach/Bowel: Redemonstration of short segmental thickening of the sigmoid with large giant diverticulum suspected containing stool off the affected sigmoid. Mild fat stranding is seen about the presumed large diverticulum. Moderate sigmoid diverticulosis is otherwise noted with a moderate amount of retained stool and contrast in the colon. No small bowel dilatation or inflammation. Decompressed stomach is demonstrated. Vascular/Lymphatic: Marked aortoiliac and branch vessel atherosclerosis without aneurysm. Chronic infrarenal aortic dissection. No lymphadenopathy by CT size criteria. A few small mesenteric nodes are seen adjacent to the large sigmoid outpouching Reproductive: Normal size prostate with a few peripheral zone calcifications. Other: No intraperitoneal free fluid nor free air. Musculoskeletal: Status post median sternotomy. Moderate right and small to moderate left fat containing inguinal hernias with surgical clips in the left inguinal soft tissues  compatible with prior vascular access. Bilateral fem-fem bypass  graft noted. IMPRESSION: 1. Redemonstration of large sigmoid outpouching with inflammatory change similar to prior without evidence of free intraperitoneal air, fluid or bowel obstruction. Differential considerations remain inflamed giant sigmoid diverticulum, stigmata of a chronic abscess though unchanged in appearance from recent comparison, ulcerated neoplasm among some possibilities though not exclusive. Favor giant sigmoid diverticulum. No significant progression however is seen since prior. 2. Marked aortoiliac and branch vessel atherosclerosis. 3. Stable bilateral renal cysts. 4. Punctate calculus suspected along the dependent wall of the gallbladder on current study. Electronically Signed   By: Ashley Royalty M.D.   On: 12/12/2017 18:33   Dg Chest Port 1 View  Result Date: 12/12/2017 CLINICAL DATA:  Leukocytosis EXAM: PORTABLE CHEST 1 VIEW COMPARISON:  10/01/2017 FINDINGS: Postop CABG without heart failure or edema. No pleural effusion. Negative for pneumonia IMPRESSION: No active disease. Electronically Signed   By: Franchot Gallo M.D.   On: 12/12/2017 13:49    Anti-infectives: Anti-infectives (From admission, onward)   Start     Dose/Rate Route Frequency Ordered Stop   12/13/17 0100  vancomycin (VANCOCIN) 500 mg in sodium chloride 0.9 % 100 mL IVPB  Status:  Discontinued     500 mg 100 mL/hr over 60 Minutes Intravenous Every 12 hours 12/12/17 1250 12/12/17 1714   12/12/17 2200  piperacillin-tazobactam (ZOSYN) IVPB 3.375 g     3.375 g 12.5 mL/hr over 240 Minutes Intravenous Every 8 hours 12/12/17 1250     12/12/17 1700  cefoTEtan (CEFOTAN) 2 g in sodium chloride 0.9 % 100 mL IVPB  Status:  Discontinued     2 g 200 mL/hr over 30 Minutes Intravenous To ShortStay [facility] 12/12/17 1357 12/12/17 1812   12/12/17 1215  piperacillin-tazobactam (ZOSYN) IVPB 3.375 g     3.375 g 100 mL/hr over 30 Minutes Intravenous  Once 12/12/17 1212  12/12/17 1420   12/12/17 1215  vancomycin (VANCOCIN) IVPB 1000 mg/200 mL premix     1,000 mg 200 mL/hr over 60 Minutes Intravenous  Once 12/12/17 1212 12/12/17 1546      Assessment/Plan: Focal sigmoid diverticulitis with "smoldering" intermittent episodes of sepsis over the last few months  Recommend Hartmann's procedure with sigmoid colectomy/ descending colostomy to resect diseased portion of colon.  The surgical procedure has been discussed with the patient.  Potential risks, benefits, alternative treatments, and expected outcomes have been explained.  All of the patient's questions at this time have been answered.  The likelihood of reaching the patient's treatment goal is good.  The patient understand the proposed surgical procedure and wishes to proceed.   LOS: 1 day    [name] 12/13/2017

## 2017-12-13 NOTE — Telephone Encounter (Addendum)
Spoke with [name]. Discussed findings of surgery today, large sigmoid colon mass. Await pathology. She was very appreciated of our help, Dr. Laural Golden as well.

## 2017-12-13 NOTE — Telephone Encounter (Signed)
See other telephone note.  

## 2017-12-13 NOTE — Telephone Encounter (Signed)
Left message on answering machine for [name] to call back.

## 2017-12-13 NOTE — Transfer of Care (Signed)
Immediate Anesthesia Transfer of Care Note  Patient: [name]  Procedure(s) Performed: EXPLORATORY LAPAROTOMY, SIGMOID COLECTOMY WITH COLOSTOMY (N/A Abdomen) COLOSTOMY (N/A Abdomen)  Patient Location: PACU  Anesthesia Type:General  Level of Consciousness: drowsy and patient cooperative  Airway & Oxygen Therapy: Patient Spontanous Breathing and Patient connected to nasal cannula oxygen  Post-op Assessment: Report given to RN, Post -op Vital signs reviewed and stable and Patient moving all extremities  Post vital signs: Reviewed and stable  Last Vitals:  Vitals Value Taken Time  BP 178/63 12/13/2017  1:00 PM  Temp    Pulse 86 12/13/2017  1:01 PM  Resp 11 12/13/2017  1:01 PM  SpO2 97 % 12/13/2017  1:01 PM  Vitals shown include unvalidated device data.  Last Pain:  Vitals:   12/13/17 0418  TempSrc: Oral  PainSc:          Complications: No apparent anesthesia complications

## 2017-12-13 NOTE — Op Note (Signed)
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  This is associated with intermittent episodes of fever, Reiger's, elevated white blood cell count and worsening abdominal pain.  He has also had some lower GI bleeding.  Colonoscopy has not been helpful in evaluating his colon.  CT scan shows what appears to be a 7 cm "outpouching" which may be a chronic abscess versus a large mouth diverticulum in the left lower quadrant.  He has been cleared by cardiology and presents now for resection.  We are planning a colostomy as we are not able to prep the patient.  Description of procedure: The patient is brought to the operating room and placed in supine position on operating room table.  After an adequate level of general anesthesia was obtained, a Foley catheter was placed under sterile technique.  The patient's abdomen was prepped with ChloraPrep and draped in sterile fashion.  He has a small umbilical hernia.  We made a vertical midline incision including the umbilical hernia.  We dissected down the fascia with cautery.  We open the linea alba and dissected in the peritoneal cavity.  There is no sign of purulence and minimal free fluid.  We palpated the liver.  The anterior surface liver appears normal.  There are no obvious palpable masses on the right side of the abdomen.  We examined the left side.  The anterior wall of sigmoid colon appears relatively normal.  However in the mesentery of the sigmoid colon there is a very large hard palpable mass.  This is densely adherent to the lateral abdominal wall in the left lower quadrant  The  sigmoid colon distal to this area appears quite normal.  We used cautery to divide the white line of Toldt laterally.  We mobilized the sigmoid colon medially.  We dissected down in the pelvis.  The ureter on the left was clearly identified.  We dissected down until we were distal to the mass.  We divided the distal sigmoid colon with a GIA-75 stapler.  The mesentery of the colon was then divided with the LigaSure device.  We carefully dissected superiorly staying anterior to the ureter.  As we were dissecting in the mesentery of the sigmoid colon, we encountered a small fluid-filled structure.  This did not appear to be firm or inflamed.  The lining of the cystic structure was smooth with no sign of irritation or inflammation.  We explored this structure and it does not seem to connect to any other structures.  There is no bowel contents within the structure.  After the initial encountered clear fluid there was no further drainage of fluid.  We administered methylene blue intravenously and waited several minutes until we could see blue urine in the Foley catheter.  No blue dye was seen in this cystic structure.  Frozen section of the wall of the cyst was inconclusive.  We determined that this likely represents a peritoneal cyst.  We completed our dissection superiorly with the LigaSure.  While we were proximal to the large mass we divided the descending colon with a GIA-75 stapler.  The specimen was oriented with a suture at the proximal margin.  A single suture was placed at  the edge of the cystic structure on the posterior surface of the mass.  This was sent for pathologic examination.  The wound was then thoroughly irrigated and inspected for hemostasis.  The staple line of the distal stump was tagged with Prolene suture.  We selected a point on the left side of the abdomen and excise a small circle of skin.  I dissected down to the muscle and made a cruciate incision.  We then brought the descending colon out  through this site as a descending colostomy.  We irrigated the wound thoroughly and inspected for hemostasis.  The fascia was reapproximated with double-stranded #1 PDS suture.  Subcutaneous tissues were irrigated and staples were used to close the skin.  The descending colostomy was then matured by amputating the staple line and using interrupted 3-0 Vicryl sutures.  A sterile dressing was applied to the midline incision.  Ostomy appliance was applied.  The patient was then extubated and brought to the recovery room in stable condition.  All sponge, instrument, and needle counts are correct.  Imogene Burn. Georgette Dover, MD, [facility]  General/ Trauma Surgery  12/13/2017 1:17 PM

## 2017-12-13 NOTE — Anesthesia Preprocedure Evaluation (Addendum)
Anesthesia Evaluation  Patient identified by MRN, date of birth, ID band Patient awake    Reviewed: Allergy & Precautions, NPO status , Patient's Chart, lab work & pertinent test results, reviewed documented beta blocker date and time   Airway Mallampati: II  TM Distance: >3 FB Neck ROM: Full    Dental no notable dental hx. (+) Partial Upper, Dental Advisory Given   Pulmonary former smoker,    Pulmonary exam normal breath sounds clear to auscultation       Cardiovascular hypertension, Pt. on medications and Pt. on home beta blockers + CAD  Normal cardiovascular exam Rhythm:Regular Rate:Normal     Neuro/Psych    GI/Hepatic   Endo/Other  diabetes, Type 2  Renal/GU      Musculoskeletal   Abdominal   Peds  Hematology   Anesthesia Other Findings   Reproductive/Obstetrics                                                             Anesthesia Evaluation  Patient identified by MRN, date of birth, ID band Patient awake    Reviewed: Allergy & Precautions, NPO status , Patient's Chart, lab work & pertinent test results  Airway Mallampati: II  TM Distance: >3 FB Neck ROM: Full    Dental  (+) Edentulous Upper   Pulmonary former smoker,    breath sounds clear to auscultation       Cardiovascular hypertension,  Rhythm:Regular Rate:Normal     Neuro/Psych    GI/Hepatic   Endo/Other  diabetes  Renal/GU      Musculoskeletal   Abdominal   Peds  Hematology   Anesthesia Other Findings   Reproductive/Obstetrics                            Anesthesia Physical Anesthesia Plan  ASA: III  Anesthesia Plan: General   Post-op Pain Management:    Induction: Intravenous  Airway Management Planned: Oral ETT  Additional Equipment: Arterial line  Intra-op Plan:   Post-operative Plan: Extubation in OR  Informed Consent: I have reviewed the  patients History and Physical, chart, labs and discussed the procedure including the risks, benefits and alternatives for the proposed anesthesia with the patient or authorized representative who has indicated his/her understanding and acceptance.   Dental advisory given  Plan Discussed with: CRNA and Anesthesiologist  Anesthesia Plan Comments:         Anesthesia Quick Evaluation  Anesthesia Physical Anesthesia Plan  ASA: III  Anesthesia Plan: General   Post-op Pain Management:    Induction: Intravenous  PONV Risk Score and Plan: 3 and Ondansetron, Dexamethasone and Midazolam  Airway Management Planned: Oral ETT  Additional Equipment: None  Intra-op Plan:   Post-operative Plan: Extubation in OR  Informed Consent: I have reviewed the patients History and Physical, chart, labs and discussed the procedure including the risks, benefits and alternatives for the proposed anesthesia with the patient or authorized representative who has indicated his/her understanding and acceptance.   Dental advisory given  Plan Discussed with: CRNA, Anesthesiologist and Surgeon  Anesthesia Plan Comments:        Anesthesia Quick Evaluation

## 2017-12-14 ENCOUNTER — Encounter (HOSPITAL_COMMUNITY): Payer: Self-pay | Admitting: Surgery

## 2017-12-14 LAB — GLUCOSE, CAPILLARY
GLUCOSE-CAPILLARY: 105 mg/dL — AB (ref 65–99)
GLUCOSE-CAPILLARY: 115 mg/dL — AB (ref 65–99)
GLUCOSE-CAPILLARY: 120 mg/dL — AB (ref 65–99)
GLUCOSE-CAPILLARY: 120 mg/dL — AB (ref 65–99)
GLUCOSE-CAPILLARY: 99 mg/dL (ref 65–99)
Glucose-Capillary: 102 mg/dL — ABNORMAL HIGH (ref 65–99)
Glucose-Capillary: 119 mg/dL — ABNORMAL HIGH (ref 65–99)

## 2017-12-14 LAB — CBC
HCT: 36.1 % — ABNORMAL LOW (ref 39.0–52.0)
Hemoglobin: 11.2 g/dL — ABNORMAL LOW (ref 13.0–17.0)
MCH: 27.7 pg (ref 26.0–34.0)
MCHC: 31 g/dL (ref 30.0–36.0)
MCV: 89.4 fL (ref 78.0–100.0)
PLATELETS: 443 10*3/uL — AB (ref 150–400)
RBC: 4.04 MIL/uL — AB (ref 4.22–5.81)
RDW: 15.2 % (ref 11.5–15.5)
WBC: 16.6 10*3/uL — ABNORMAL HIGH (ref 4.0–10.5)

## 2017-12-14 LAB — BASIC METABOLIC PANEL
Anion gap: 11 (ref 5–15)
BUN: 14 mg/dL (ref 6–20)
CHLORIDE: 105 mmol/L (ref 101–111)
CO2: 21 mmol/L — ABNORMAL LOW (ref 22–32)
CREATININE: 1.26 mg/dL — AB (ref 0.61–1.24)
Calcium: 8.9 mg/dL (ref 8.9–10.3)
GFR calc Af Amer: >60 mL/min (ref >60)
GFR, EST NON AFRICAN AMERICAN: 56 mL/min — AB (ref >60)
GLUCOSE: 115 mg/dL — AB (ref 65–99)
POTASSIUM: 6.1 mmol/L — AB (ref 3.5–5.1)
SODIUM: 137 mmol/L (ref 135–145)

## 2017-12-14 LAB — POTASSIUM: Potassium: 4.9 mmol/L (ref 3.5–5.1)

## 2017-12-14 MED ORDER — SODIUM CHLORIDE 0.9 % IV SOLN
INTRAVENOUS | Status: DC
  Administered 2017-12-14: 1 mL via INTRAVENOUS
  Administered 2017-12-15 – 2017-12-16 (×3): via INTRAVENOUS

## 2017-12-14 MED ORDER — HYDROMORPHONE HCL 2 MG/ML IJ SOLN
1.0000 mg | INTRAMUSCULAR | Status: DC | PRN
  Administered 2017-12-14 (×3): 1 mg via INTRAVENOUS
  Filled 2017-12-14 (×3): qty 1

## 2017-12-14 MED ORDER — TRAMADOL HCL 50 MG PO TABS
50.0000 mg | ORAL_TABLET | Freq: Four times a day (QID) | ORAL | Status: DC
  Administered 2017-12-14 – 2017-12-17 (×13): 50 mg via ORAL
  Filled 2017-12-14 (×13): qty 1

## 2017-12-14 MED ORDER — KETOROLAC TROMETHAMINE 15 MG/ML IJ SOLN
15.0000 mg | Freq: Four times a day (QID) | INTRAMUSCULAR | Status: DC
  Administered 2017-12-14 – 2017-12-16 (×8): 15 mg via INTRAVENOUS
  Filled 2017-12-14 (×8): qty 1

## 2017-12-14 MED ORDER — SIMETHICONE 80 MG PO CHEW
80.0000 mg | CHEWABLE_TABLET | Freq: Four times a day (QID) | ORAL | Status: DC | PRN
Start: 2017-12-14 — End: 2017-12-17
  Administered 2017-12-14: 80 mg via ORAL
  Filled 2017-12-14: qty 1

## 2017-12-14 MED ORDER — ACETAMINOPHEN 325 MG PO TABS
650.0000 mg | ORAL_TABLET | Freq: Four times a day (QID) | ORAL | Status: DC
  Administered 2017-12-14 – 2017-12-16 (×8): 650 mg via ORAL
  Filled 2017-12-14 (×7): qty 2

## 2017-12-14 MED ORDER — DIPHENHYDRAMINE HCL 25 MG PO CAPS
25.0000 mg | ORAL_CAPSULE | Freq: Every evening | ORAL | Status: DC | PRN
  Administered 2017-12-14: 25 mg via ORAL
  Filled 2017-12-14: qty 1

## 2017-12-14 NOTE — Progress Notes (Signed)
[facility]/Trauma Progress Note  1 Day Post-Op   Assessment/Plan CAD - Hx of CABG 2017, last echo in 2017 showed EF 50-55% HTN PVD - Hx of fem-fem bypass, also has fem-pop bypass T2DM CKD stage III  Sigmoid diverticulosis? Focal area of sigmoid colonic wall thickening and mass - differential includes chronic abscess vs large diverticulum vs chronically perforated neoplasm - WBC down to 16.6, afebrile - will order CEA, patient had colonoscopy in 2015 that showed diverticula but no concern for malignancy - S/P Sigmoid colectomy, descending colostomy, Dr. Georgette Dover, 05/02  FEN: CLD, IVF VTE: SCDs, lovenox okay from a surgical standpoint ID: IV zosyn 5/1>> Foley: yes Follow up: Dr. Georgette Dover  DISPO: CLD until return of bowel function. IS. Encourage ambulation. Pain control. Added scheduled tylenol and Tramadol. Dilaudid instead of morphine.     LOS: 2 days    Subjective: CC: abdominal pain  Pain is severe. Pt did not sleep overnight. Wife at bedside. She is asking for something to help him sleep. No nausea or vomiting, fever or chills overnight.  Objective: Vital signs in last 24 hours: Temp:  [97.5 F (36.4 C)-98.4 F (36.9 C)] 98.1 F (36.7 C) (05/03 0532) Pulse Rate:  [68-89] 68 (05/03 0532) Resp:  [8-19] 17 (05/03 0532) BP: (151-178)/(61-74) 163/64 (05/03 0532) SpO2:  [91 %-99 %] 99 % (05/03 0532) Weight:  [65 kg (143 lb 4.8 oz)-65.3 kg (144 lb)] 65 kg (143 lb 4.8 oz) (05/03 0500) Last BM Date: 12/13/17  Intake/Output from previous day: 05/02 0701 - 05/03 0700 In: 2984.5 [P.O.:170; I.V.:2614.5; IV Piggyback:100] Out: 2325 [Urine:2175; Blood:150] Intake/Output this shift: Total I/O In: 3 [I.V.:3] Out: -   PE: Gen:  Alert, NAD, very uncomfortable and aggitated Pulm:  Rate and effort normal Abd: Soft, not distended, incisions C/D/I, ostomy pink with sweat in the bag. Pt would not allow me to touch abdomen.  Skin: warm and  dry  Anti-infectives: Anti-infectives (From admission, onward)   Start     Dose/Rate Route Frequency Ordered Stop   12/13/17 0100  vancomycin (VANCOCIN) 500 mg in sodium chloride 0.9 % 100 mL IVPB  Status:  Discontinued     500 mg 100 mL/hr over 60 Minutes Intravenous Every 12 hours 12/12/17 1250 12/12/17 1714   12/12/17 2200  piperacillin-tazobactam (ZOSYN) IVPB 3.375 g     3.375 g 12.5 mL/hr over 240 Minutes Intravenous Every 8 hours 12/12/17 1250     12/12/17 1700  cefoTEtan (CEFOTAN) 2 g in sodium chloride 0.9 % 100 mL IVPB  Status:  Discontinued     2 g 200 mL/hr over 30 Minutes Intravenous To [facility] 12/12/17 1357 12/12/17 1812   12/12/17 1215  piperacillin-tazobactam (ZOSYN) IVPB 3.375 g     3.375 g 100 mL/hr over 30 Minutes Intravenous  Once 12/12/17 1212 12/12/17 1420   12/12/17 1215  vancomycin (VANCOCIN) IVPB 1000 mg/200 mL premix     1,000 mg 200 mL/hr over 60 Minutes Intravenous  Once 12/12/17 1212 12/12/17 1546      Lab Results:  Recent Labs    12/13/17 0454 12/14/17 0408  WBC 8.8 16.6*  HGB 9.6* 11.2*  HCT 30.1* 36.1*  PLT 312 443*   BMET Recent Labs    12/13/17 0454 12/14/17 0408  NA 136 137  K 3.5 6.1*  CL 105 105  CO2 24 21*  GLUCOSE 78 115*  BUN 16 14  CREATININE 1.11 1.26*  CALCIUM 7.9* 8.9   PT/INR No results for input(s): LABPROT,  INR in the last 72 hours. CMP     Component Value Date/Time   NA 137 12/14/2017 0408   K 6.1 (H) 12/14/2017 0408   CL 105 12/14/2017 0408   CO2 21 (L) 12/14/2017 0408   GLUCOSE 115 (H) 12/14/2017 0408   BUN 14 12/14/2017 0408   CREATININE 1.26 (H) 12/14/2017 0408   CREATININE 1.28 (H) 04/25/2016 1038   CALCIUM 8.9 12/14/2017 0408   PROT 6.3 (L) 12/12/2017 1110   ALBUMIN 3.1 (L) 12/12/2017 1110   AST 18 12/12/2017 1110   ALT 15 (L) 12/12/2017 1110   ALKPHOS 86 12/12/2017 1110   BILITOT 0.4 12/12/2017 1110   GFRNONAA 56 (L) 12/14/2017 0408   GFRAA >60 12/14/2017 0408   Lipase  No results  found for: LIPASE  Studies/Results: Ct Abdomen Pelvis Wo Contrast  Result Date: 12/12/2017 CLINICAL DATA:  Abdominal pain and fever, abscess suspected EXAM: CT ABDOMEN AND PELVIS WITHOUT CONTRAST TECHNIQUE: Multidetector CT imaging of the abdomen and pelvis was performed following the standard protocol without IV contrast. COMPARISON:  12/06/2017 CT FINDINGS: Lower chest: Top-normal size heart with coronary arteriosclerosis and aortic atherosclerosis. No pericardial effusion. Dependent atelectasis is identified at the lung bases. Subsegmental atelectasis and/or scarring is seen in the lingula. Hepatobiliary: The liver is unremarkable for a noncontrast study. No definite space-occupying mass is identified. There is no biliary dilatation. Gallbladder is physiologically distended. Punctate calculus is seen along the dependent wall. No secondary signs of acute cholecystitis. Pancreas: Atrophic pancreas without ductal dilatation. Spleen: Normal size spleen without acute abnormality. Adrenals/Urinary Tract: Normal bilateral adrenal glands. Stable bilateral renal cysts and renovascular calcifications within both kidneys. Dense atherosclerotic calcifications at the origins both renal arteries. No significant cortical scarring. No nephrolithiasis nor obstructive uropathy. The largest simple cyst is seen in the upper pole of the right kidney measuring 4.5 cm with largest cyst in the lower pole the left kidney measuring 1.1 cm. No significant change. There is minimal cortical scarring of the left kidney. The urinary bladder is physiologically distended without focal mural thickening or calculi. Stomach/Bowel: Redemonstration of short segmental thickening of the sigmoid with large giant diverticulum suspected containing stool off the affected sigmoid. Mild fat stranding is seen about the presumed large diverticulum. Moderate sigmoid diverticulosis is otherwise noted with a moderate amount of retained stool and contrast in  the colon. No small bowel dilatation or inflammation. Decompressed stomach is demonstrated. Vascular/Lymphatic: Marked aortoiliac and branch vessel atherosclerosis without aneurysm. Chronic infrarenal aortic dissection. No lymphadenopathy by CT size criteria. A few small mesenteric nodes are seen adjacent to the large sigmoid outpouching Reproductive: Normal size prostate with a few peripheral zone calcifications. Other: No intraperitoneal free fluid nor free air. Musculoskeletal: Status post median sternotomy. Moderate right and small to moderate left fat containing inguinal hernias with surgical clips in the left inguinal soft tissues compatible with prior vascular access. Bilateral fem-fem bypass graft noted. IMPRESSION: 1. Redemonstration of large sigmoid outpouching with inflammatory change similar to prior without evidence of free intraperitoneal air, fluid or bowel obstruction. Differential considerations remain inflamed giant sigmoid diverticulum, stigmata of a chronic abscess though unchanged in appearance from recent comparison, ulcerated neoplasm among some possibilities though not exclusive. Favor giant sigmoid diverticulum. No significant progression however is seen since prior. 2. Marked aortoiliac and branch vessel atherosclerosis. 3. Stable bilateral renal cysts. 4. Punctate calculus suspected along the dependent wall of the gallbladder on current study. Electronically Signed   By: Ashley Royalty M.D.   On: 12/12/2017  18:33   Dg Chest Port 1 View  Result Date: 12/12/2017 CLINICAL DATA:  Leukocytosis EXAM: PORTABLE CHEST 1 VIEW COMPARISON:  10/01/2017 FINDINGS: Postop CABG without heart failure or edema. No pleural effusion. Negative for pneumonia IMPRESSION: No active disease. Electronically Signed   By: Franchot Gallo M.D.   On: 12/12/2017 13:49      Kalman Drape , [facility] 12/14/2017, 9:08 AM  Pager: [phone] Mon-Wed, Friday 7:00am-4:30pm Thurs  7am-11:30am  Consults: [phone]

## 2017-12-14 NOTE — Progress Notes (Signed)
ANTIBIOTIC CONSULT NOTE -  Pharmacy Consult for Zosyn Indication: Abdominal infection  No Active Allergies  Patient Measurements: Height: 5\' 6"  (167.6 cm) Weight: 143 lb 4.8 oz (65 kg) IBW/kg (Calculated) : 63.8 Adjusted Body Weight:    Vital Signs: Temp: 98.1 F (36.7 C) (05/03 0532) Temp Source: Oral (05/03 0532) BP: 163/64 (05/03 0532) Pulse Rate: 68 (05/03 0532) Intake/Output from previous day: 05/02 0701 - 05/03 0700 In: 2984.5 [P.O.:170; I.V.:2614.5; IV Piggyback:100] Out: 2325 [Urine:2175; Blood:150] Intake/Output from this shift: Total I/O In: 3 [I.V.:3] Out: -   Labs: Recent Labs    12/12/17 1110 12/13/17 0454 12/14/17 0408  WBC 25.3* 8.8 16.6*  HGB 12.2* 9.6* 11.2*  PLT 403* 312 443*  CREATININE 1.37* 1.11 1.26*   Estimated Creatinine Clearance: 49.2 mL/min (A) (by C-G formula based on SCr of 1.26 mg/dL (H)). No results for input(s): VANCOTROUGH, VANCOPEAK, VANCORANDOM, GENTTROUGH, GENTPEAK, GENTRANDOM, TOBRATROUGH, TOBRAPEAK, TOBRARND, AMIKACINPEAK, AMIKACINTROU, AMIKACIN in the last 72 hours.   Microbiology:   Medical History: Past Medical History:  Diagnosis Date  . CAD S/P percutaneous coronary angioplasty 1998   PCI TO CX  . CKD (chronic kidney disease) stage 3, GFR 30-59 ml/min (HCC)   . Diabetes mellitus without complication ([place])   . Diverticulitis 08/16/2017   hospitalized with diverticulitis/sepsis  . GERD (gastroesophageal reflux disease)   . Headache   . 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 Hypercholesteremia   . Hypertension   . Neuropathy   . Peripheral vascular disease ([place])    s/p R-L FEM-FEM BYPASS  . Pneumonia   . Tachycardia    after CABG, pt on medicine for this   Assessment:  ID: WBC 25.3>8.8>16.6 today, LA 2.2, Afebrile. Scr 1.1>1.26 today. CrCl 49  Vanc 5/1>>5/1 Zosyn 5/1>>  5/1 EVO:JJKK 5/2: XFG:[id number]  Goal of Therapy:  Eradication of infection  Plan:  Continue Zosyn 3.375g IV q 8hrs Pharmacy will sign  off. Please reconsult for further dosing assitance. LMWH ok'd by surgery for prophylaxis.   Kateryn Marasigan S. Alford Highland, PharmD, BCPS Clinical Staff Pharmacist Pager [phone]  Eilene Ghazi Stillinger 12/14/2017,10:32 AM

## 2017-12-14 NOTE — Progress Notes (Signed)
PROGRESS NOTE   Attending MD note  Patient was seen, examined,treatment plan was discussed with the PA-S.  I have personally reviewed the clinical findings, lab, imaging studies and management of this patient in detail. I agree with the documentation, as recorded by the PA-S  Patient is 72 year old male with history of coronary artery disease status post CABG, hypertension, hyperlipidemia, type 2 diabetes mellitus, peripheral vascular disease status post femoral-popliteal bypass who has been having recurrent left lower quadrant abdominal pain, was diagnosed with diverticulitis and has had multiple evaluations in the recent past including a hospitalization, he was treated conservatively with antibiotics and despite that his symptoms have remained persistent and has now been admitted to the hospital again.  Underwent surgery this morning  On Exam: Gen. exam: Awake, alert, not in any distress Chest: Good air entry bilaterally, no rhonchi or rales CVS: S1-S2 regular, no murmurs Abdomen: Colostomy bag in place, scant blood on the dressing Neurology: Non-focal Skin: No rash or lesions  Plan  Sepsis associated with apparent diverticular disease / diverticulitis -Patient has been started on Zosyn, continue, appreciate general surgery input -Status post resection on 5/2, appears to have a mass, pathology pending. 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 Cruzita Lederer, MD [facility] [phone]  If  7PM-7AM, please contact night-coverage [email or web address] Password [email or web address]    [name]  HYQ:[id number] DOB: July 08, 1946 DOA: 12/12/2017 PCP: [name], MD   Brief Narrative:  [name] is a 72 year old male who presents with fever/weakness/presyncope with recurrent left lower quadrant abdominal pain, progressive weight loss and recent large bright red bloody bowel movements. Past medical history is significant for CAD with history of stents and CABG (2017), hypertension, dyslipidemia, type 2 diabetes, peripheral vascular disease s/p femoral/popliteal bypass, GERD and diverticulitis. Patient has been admitted multiple times since October for similar symptoms, recently discharged from [facility] on 4/26 and was sent home on antibiotics. On admission patient was found to be septic with tachycardia, tachypnea and leukocytosis WBC 25.8, Lactic acid 2.22. CT of the abdomen showed large sigmoid outpouching with inflammatory change concerning for giant sigmoid diverticulum. CXR unremarkable.  Patient was admitted with a working diagnosis of sepsis secondary to possible diverticulitis.   Assessment & Plan:   Principal Problem:   Sepsis ([place]) Active Problems:   Diverticulitis   Hypertension   Diabetes mellitus without complication ([place])   CKD (chronic kidney disease), stage III ([place])   Hypercholesteremia   CAD S/P percutaneous coronary angioplasty   Hx of CABG   Acute hypokalemia   Preoperative cardiovascular examination   Sepsis secondary to sigmoid colonic wall thickening and mass -Patient underwent sigmoid colectomy with descending colostomy placement yesterday 5/2 by Dr. Georgette Dover and was found to have a mass in the sigmoid colon, which was removed and sent to pathology. Possible differential for the structure includes chronic abscess vs large diverticulum vs chronically perforated neoplasm. -Has remained afebrile, with elevated blood pressures 160-170 SBP.  -Last blood work performed  after surgery yesterday shows WBC at 16.6, hgb stable at 11.2. -Urine output  in the last 24 hours 2175 ml. -Continue to monitor for return of bowel function. -Continue pain management and antibiotic therapy. -Encouraged ambulation and spirometry use.  Hypokalemia -Elevated today at 6.1, IV potassium discontinued. -Continue IV fluids. -Monitor closely.  Type 2 diabetes mellitus -Continue Lantus 20 units nightly -Follow CBGs and continue SSI  Coronary artery disease post CABG -Denies chest pain/tightness, currently stable.   Peripheral vascular disease -Status post femoral/popliteal bypass, continue to monitor.  DVT prophylaxis: SCD's. Code Status: FULL. Family Communication: Wife at bedside. Disposition Plan: Home when clinically improved.   Consultants:   General surgery  Cardiology  Gastroenterology  Procedures:   None.  Antimicrobials:   IV Zosyn 5/1>>   Subjective: Denies chest pain or tightness, headache, dizziness, fever, chills, nausea, vomiting. Reports diffuse and severe abdominal pain, especially towards the left side of his stomach.  Objective: Vitals:   12/14/17 0122 12/14/17 0500 12/14/17 0532 12/14/17 1052  BP: (!) 162/73  (!) 163/64 (!) 160/75  Pulse: 85  68 88  Resp: 17  17   Temp: (!) 97.5 F (36.4 C)  98.1 F (36.7 C) 98.6 F (37 C)  TempSrc: Oral  Oral Oral  SpO2: 97%  99% 97%  Weight:  65 kg (143 lb 4.8 oz)    Height:        Intake/Output Summary (Last 24 hours) at 12/14/2017 1111 Last data filed at 12/14/2017 0905 Gross per 24 hour  Intake 2387.5 ml  Output 1975 ml  Net 412.5 ml   Filed Weights   12/12/17 1108 12/13/17 0950 12/14/17 0500  Weight: 65.3 kg (144 lb) 65.3 kg (144 lb) 65 kg (143 lb 4.8 oz)    Examination:  General exam: Alert, uncomfortable. Respiratory system: Clear to auscultation. Respiratory effort normal. Cardiovascular system: S1 & S2 heard, RRR. No murmurs, rubs, gallops or clicks. No pedal  edema. Gastrointestinal system: Colostomy in place, would not allow me to touch abdomen. Central nervous system: Alert and oriented. No focal neurological deficits. Extremities: Moves all 4 extremities.  Skin: No rashes, lesions or ulcers. Colostomy in place. Psychiatry: Judgement and insight appear normal. Mood & affect appropriate.     Data Reviewed: I have personally reviewed following labs and imaging studies  CBC: Recent Labs  Lab 12/12/17 1110 12/13/17 0454 12/14/17 0408  WBC 25.3* 8.8 16.6*  NEUTROABS 24.3*  --   --   HGB 12.2* 9.6* 11.2*  HCT 37.8* 30.1* 36.1*  MCV 87.7 87.8 89.4  PLT 403* 312 751*   Basic Metabolic Panel: Recent Labs  Lab 12/12/17 1110 12/13/17 0454 12/14/17 0408  NA 137 136 137  K 3.0* 3.5 6.1*  CL 97* 105 105  CO2 25 24 21*  GLUCOSE 120* 78 115*  BUN 21* 16 14  CREATININE 1.37* 1.11 1.26*  CALCIUM 9.0 7.9* 8.9   GFR: Estimated Creatinine Clearance: 49.2 mL/min (A) (by C-G formula based on SCr of 1.26 mg/dL (H)). Liver Function Tests: Recent Labs  Lab 12/12/17 1110  AST 18  ALT 15*  ALKPHOS 86  BILITOT 0.4  PROT 6.3*  ALBUMIN 3.1*   No results for input(s): LIPASE, AMYLASE in the last 168 hours. No results for input(s): AMMONIA in the last 168 hours. Coagulation Profile: No results for input(s): INR, PROTIME in the last 168 hours. Cardiac Enzymes: No results for input(s): CKTOTAL, CKMB, CKMBINDEX, TROPONINI in the last 168 hours. BNP (last 3 results) No results for input(s): PROBNP in the last 8760 hours. HbA1C: No results for input(s):  HGBA1C in the last 72 hours. CBG: Recent Labs  Lab 12/13/17 1719 12/13/17 2008 12/14/17 0025 12/14/17 0428 12/14/17 0810  GLUCAP 112* 127* 120* 102* 105*   Lipid Profile: No results for input(s): CHOL, HDL, LDLCALC, TRIG, CHOLHDL, LDLDIRECT in the last 72 hours. Thyroid Function Tests: No results for input(s): TSH, T4TOTAL, FREET4, T3FREE, THYROIDAB in the last 72 hours. Anemia  Panel: No results for input(s): VITAMINB12, FOLATE, FERRITIN, TIBC, IRON, RETICCTPCT in the last 72 hours. Sepsis Labs: Recent Labs  Lab 12/12/17 1114 12/12/17 1643 12/12/17 2258  LATICACIDVEN 2.22* 2.08* 1.8    Recent Results (from the past 240 hour(s))  MRSA PCR Screening     Status: None   Collection Time: 12/06/17  5:57 AM  Result Value Ref Range Status   MRSA by PCR NEGATIVE NEGATIVE Final    Comment:        The GeneXpert MRSA Assay (FDA approved for NASAL specimens only), is one component of a comprehensive MRSA colonization surveillance program. It is not intended to diagnose MRSA infection nor to guide or monitor treatment for MRSA infections. Performed at [facility], [address] [place], [place] [address]   Blood Culture (routine x 2)     Status: None (Preliminary result)   Collection Time: 12/12/17 12:50 PM  Result Value Ref Range Status   Specimen Description BLOOD RIGHT FOREARM  Final   Special Requests   Final    BOTTLES DRAWN AEROBIC AND ANAEROBIC Blood Culture adequate volume   Culture   Final    NO GROWTH 1 DAY Performed at [facility], [place] [address], [place], [place] [address]    Report Status PENDING  Incomplete  Blood Culture (routine x 2)     Status: None (Preliminary result)   Collection Time: 12/12/17  1:30 PM  Result Value Ref Range Status   Specimen Description BLOOD LEFT ANTECUBITAL  Final   Special Requests   Final    BOTTLES DRAWN AEROBIC AND ANAEROBIC Blood Culture adequate volume   Culture   Final    NO GROWTH 1 DAY Performed at [facility], [place] [address] [place], [place] [address]    Report Status PENDING  Incomplete  Culture, Urine     Status: None   Collection Time: 12/12/17  4:02 PM  Result Value Ref Range Status   Specimen Description URINE, RANDOM  Final   Special Requests NONE  Final   Culture   Final    NO GROWTH Performed at [facility], [place] [address], [place],  [address]    Report  Status 12/13/2017 FINAL  Final         Radiology Studies: Ct Abdomen Pelvis Wo Contrast  Result Date: 12/12/2017 CLINICAL DATA:  Abdominal pain and fever, abscess suspected EXAM: CT ABDOMEN AND PELVIS WITHOUT CONTRAST TECHNIQUE: Multidetector CT imaging of the abdomen and pelvis was performed following the standard protocol without IV contrast. COMPARISON:  12/06/2017 CT FINDINGS: Lower chest: Top-normal size heart with coronary arteriosclerosis and aortic atherosclerosis. No pericardial effusion. Dependent atelectasis is identified at the lung bases. Subsegmental atelectasis and/or scarring is seen in the lingula. Hepatobiliary: The liver is unremarkable for a noncontrast study. No definite space-occupying mass is identified. There is no biliary dilatation. Gallbladder is physiologically distended. Punctate calculus is seen along the dependent wall. No secondary signs of acute cholecystitis. Pancreas: Atrophic pancreas without ductal dilatation. Spleen: Normal size spleen without acute abnormality. Adrenals/Urinary Tract: Normal bilateral adrenal glands. Stable bilateral  renal cysts and renovascular calcifications within both kidneys. Dense atherosclerotic calcifications at the origins both renal arteries. No significant cortical scarring. No nephrolithiasis nor obstructive uropathy. The largest simple cyst is seen in the upper pole of the right kidney measuring 4.5 cm with largest cyst in the lower pole the left kidney measuring 1.1 cm. No significant change. There is minimal cortical scarring of the left kidney. The urinary bladder is physiologically distended without focal mural thickening or calculi. Stomach/Bowel: Redemonstration of short segmental thickening of the sigmoid with large giant diverticulum suspected containing stool off the affected sigmoid. Mild fat stranding is seen about the presumed large diverticulum. Moderate sigmoid diverticulosis is otherwise noted with a moderate amount of  retained stool and contrast in the colon. No small bowel dilatation or inflammation. Decompressed stomach is demonstrated. Vascular/Lymphatic: Marked aortoiliac and branch vessel atherosclerosis without aneurysm. Chronic infrarenal aortic dissection. No lymphadenopathy by CT size criteria. A few small mesenteric nodes are seen adjacent to the large sigmoid outpouching Reproductive: Normal size prostate with a few peripheral zone calcifications. Other: No intraperitoneal free fluid nor free air. Musculoskeletal: Status post median sternotomy. Moderate right and small to moderate left fat containing inguinal hernias with surgical clips in the left inguinal soft tissues compatible with prior vascular access. Bilateral fem-fem bypass graft noted. IMPRESSION: 1. Redemonstration of large sigmoid outpouching with inflammatory change similar to prior without evidence of free intraperitoneal air, fluid or bowel obstruction. Differential considerations remain inflamed giant sigmoid diverticulum, stigmata of a chronic abscess though unchanged in appearance from recent comparison, ulcerated neoplasm among some possibilities though not exclusive. Favor giant sigmoid diverticulum. No significant progression however is seen since prior. 2. Marked aortoiliac and branch vessel atherosclerosis. 3. Stable bilateral renal cysts. 4. Punctate calculus suspected along the dependent wall of the gallbladder on current study. Electronically Signed   By: Ashley Royalty M.D.   On: 12/12/2017 18:33   Dg Chest Port 1 View  Result Date: 12/12/2017 CLINICAL DATA:  Leukocytosis EXAM: PORTABLE CHEST 1 VIEW COMPARISON:  10/01/2017 FINDINGS: Postop CABG without heart failure or edema. No pleural effusion. Negative for pneumonia IMPRESSION: No active disease. Electronically Signed   By: Franchot Gallo M.D.   On: 12/12/2017 13:49        Scheduled Meds: . acetaminophen  650 mg Oral Q6H  . cilostazol  100 mg Oral BID  . insulin aspart  0-9 Units  Subcutaneous Q4H  . insulin glargine  20 Units Subcutaneous QHS  . metoprolol tartrate  5 mg Intravenous Q8H  . sodium chloride flush  3 mL Intravenous Q12H  . traMADol  50 mg Oral Q6H   Continuous Infusions: . sodium chloride    . piperacillin-tazobactam (ZOSYN)  IV 3.375 g (12/14/17 0500)     LOS: 2 days    Time spent: 25 minutes.    Eloy End, PA-S [facility]  If 7PM-7AM, please contact night-coverage [email or web address] Password [email or web address] 12/14/2017, 11:11 AM

## 2017-12-14 NOTE — Consult Note (Signed)
[name] Nurse ostomy consult note: POD 1 Stoma type/location: LLQ colostomy (Dr. Georgette Dover)   Stomal assessment/size: 1 and 3/4 inch, slightly oval, os at center. Edematous. Pale pink, moist. Peristomal assessment: Intact, clear.  Pouching system trimmed at medial edge to allow for midline post operative dressing. Treatment options for stomal/peristomal skin: skin barrier ring Output: Sweat, only Ostomy pouching: 2pc. 2 and 3/4 inch flat ostomy pouching system with skin barrier ring  Education provided:  Explained role of ostomy nurse and creation of stoma  Explained stoma characteristics (budded, color, texture, innervation, bleeding, care) Demonstrated pouch change to patient and wife (cutting new skin barrier, measuring stoma, cleaning peristomal skin and stoma, use of barrier ring, attaching pouch to skin barrier closing Lock and Roll closure.  Wife is able to give return demonstration of this skill x2) Education on emptying when 1/3 to 1/2 full and how to empty Demonstrated use of wick to clean tail closure of pouch Discussed bathing, diet, gas Answered patient/family questions regarding activity. Discussed pouching options such as opaque pouch with integrated gas filter and closed end pouch.     Enrolled patient in [employer] program: Yes  Supplies ordered to bedside.  Plan for next pouch change and teaching session on Monday.  Patient will require the services of [facility] for post discharge follow up on instructions and support. If you agree, please order/arrange with Case Management.  [place] nursing team will follow, and will remain available to this patient, the nursing, surgical  and medical teams.   Thanks, Maudie Flakes, MSN, RN, [place], Arther Abbott  Pager# [phone]

## 2017-12-15 LAB — GLUCOSE, CAPILLARY
GLUCOSE-CAPILLARY: 81 mg/dL (ref 65–99)
GLUCOSE-CAPILLARY: 68 mg/dL (ref 65–99)
GLUCOSE-CAPILLARY: 109 mg/dL — AB (ref 65–99)
GLUCOSE-CAPILLARY: 116 mg/dL — AB (ref 65–99)
Glucose-Capillary: 66 mg/dL (ref 65–99)

## 2017-12-15 MED ORDER — INSULIN GLARGINE 100 UNIT/ML ~~LOC~~ SOLN
16.0000 [IU] | Freq: Every day | SUBCUTANEOUS | Status: DC
Start: 2017-12-15 — End: 2017-12-16
  Filled 2017-12-15: qty 0.16

## 2017-12-15 NOTE — Progress Notes (Signed)
[facility]/Trauma Progress Note  2 Days Post-Op   Assessment/Plan CAD - Hx of CABG 2017, last echo in 2017 showed EF 50-55% HTN PVD - Hx of fem-fem bypass, also has fem-pop bypass T2DM CKD stage III  Sigmoid diverticulosis? Focal area of sigmoid colonic wall thickening and mass - differential includes chronic abscess vs large diverticulum vs chronically perforated neoplasm - WBC 16.6 05/03, afebrile - CEA pending, patient had colonoscopy in 2015 that showed diverticula but no concern for malignancy - S/P Sigmoid colectomy, descending colostomy, Dr. Georgette Dover, 05/02  FEN: CLD until stool in bag, IVF VTE: SCDs, lovenox okay from a surgical standpoint but will defer to medicine ID: IV zosyn 5/1>> Foley: yes Follow up: Dr. Georgette Dover  DISPO: CLD until return of bowel function. lovenox okay from a surgical standpoint but will defer to medicine IS. Encourage ambulation.    LOS: 3 days    Subjective: CC; abdominal pain  Pain Has greatly improved from yesterday. Patient is tolerating a clear liquid diet. He denies nausea, vomiting, fever or chills. He states he has had gas in his bag but no stool. No family at bedside.  Objective: Vital signs in last 24 hours: Temp:  [97.3 F (36.3 C)-98.7 F (37.1 C)] 97.3 F (36.3 C) (05/04 0547) Pulse Rate:  [67-86] 77 (05/04 0547) Resp:  [18] 18 (05/04 0547) BP: (91-183)/(56-78) 171/78 (05/04 0547) SpO2:  [98 %-100 %] 98 % (05/04 0547) Weight:  [64.4 kg (141 lb 15.6 oz)] 64.4 kg (141 lb 15.6 oz) (05/04 0500) Last BM Date: 12/13/17  Intake/Output from previous day: 05/03 0701 - 05/04 0700 In: 2088 [P.O.:680; I.V.:1308; IV Piggyback:100] Out: 8527 [Urine:1850] Intake/Output this shift: Total I/O In: 3 [I.V.:3] Out: -   PE: Gen:  Alert, NAD, well-appearing, pleasant Pulm:  Rate and effort normal Abd: Soft, not distended, incisions C/D/I, ostomy pink with sweat And a little air in the bag. Appropriately tender, no signs of  peritonitis Skin: warm and dry   Anti-infectives: Anti-infectives (From admission, onward)   Start     Dose/Rate Route Frequency Ordered Stop   12/13/17 0100  vancomycin (VANCOCIN) 500 mg in sodium chloride 0.9 % 100 mL IVPB  Status:  Discontinued     500 mg 100 mL/hr over 60 Minutes Intravenous Every 12 hours 12/12/17 1250 12/12/17 1714   12/12/17 2200  piperacillin-tazobactam (ZOSYN) IVPB 3.375 g     3.375 g 12.5 mL/hr over 240 Minutes Intravenous Every 8 hours 12/12/17 1250     12/12/17 1700  cefoTEtan (CEFOTAN) 2 g in sodium chloride 0.9 % 100 mL IVPB  Status:  Discontinued     2 g 200 mL/hr over 30 Minutes Intravenous To ShortStay Surgical 12/12/17 1357 12/12/17 1812   12/12/17 1215  piperacillin-tazobactam (ZOSYN) IVPB 3.375 g     3.375 g 100 mL/hr over 30 Minutes Intravenous  Once 12/12/17 1212 12/12/17 1420   12/12/17 1215  vancomycin (VANCOCIN) IVPB 1000 mg/200 mL premix     1,000 mg 200 mL/hr over 60 Minutes Intravenous  Once 12/12/17 1212 12/12/17 1546      Lab Results:  Recent Labs    12/13/17 0454 12/14/17 0408  WBC 8.8 16.6*  HGB 9.6* 11.2*  HCT 30.1* 36.1*  PLT 312 443*   BMET Recent Labs    12/13/17 0454 12/14/17 0408 12/14/17 1315  NA 136 137  --   K 3.5 6.1* 4.9  CL 105 105  --   CO2 24 21*  --   GLUCOSE  78 115*  --   BUN 16 14  --   CREATININE 1.11 1.26*  --   CALCIUM 7.9* 8.9  --    PT/INR No results for input(s): LABPROT, INR in the last 72 hours. CMP     Component Value Date/Time   NA 137 12/14/2017 0408   K 4.9 12/14/2017 1315   CL 105 12/14/2017 0408   CO2 21 (L) 12/14/2017 0408   GLUCOSE 115 (H) 12/14/2017 0408   BUN 14 12/14/2017 0408   CREATININE 1.26 (H) 12/14/2017 0408   CREATININE 1.28 (H) 04/25/2016 1038   CALCIUM 8.9 12/14/2017 0408   PROT 6.3 (L) 12/12/2017 1110   ALBUMIN 3.1 (L) 12/12/2017 1110   AST 18 12/12/2017 1110   ALT 15 (L) 12/12/2017 1110   ALKPHOS 86 12/12/2017 1110   BILITOT 0.4 12/12/2017 1110    GFRNONAA 56 (L) 12/14/2017 0408   GFRAA >60 12/14/2017 0408   Lipase  No results found for: LIPASE  Studies/Results: No results found.    Kalman Drape , [facility] 12/15/2017, 10:53 AM  Pager: [phone] Mon-Wed, Friday 7:00am-4:30pm Thurs 7am-11:30am  Consults: [phone]

## 2017-12-15 NOTE — Progress Notes (Signed)
Pt foley cath discontinued - due to void.

## 2017-12-15 NOTE — Progress Notes (Signed)
Hypoglycemic Event  CBG: 66  Treatment: 15 GM carbohydrate snack  Symptoms: None  Follow-up CBG: Time:0430 CBG Result:81  Possible Reasons for Event: Inadequate meal intake and Unknown  Comments/MD notified:NA    [name] L Duard Larsen

## 2017-12-15 NOTE — Progress Notes (Signed)
PROGRESS NOTE  [name] XBL:[id number] DOB: 1946/07/12 DOA: 12/12/2017 PCP: [name], MD   LOS: 3 days   Brief Narrative / Interim history: 72 year old male with history of coronary artery disease status post CABG, hypertension, hyperlipidemia, type 2 diabetes mellitus, peripheral vascular disease status post femoral-popliteal bypass who has been having recurrent left lower quadrant abdominal pain, was diagnosed with diverticulitis and has had multiple evaluations in the recent past including a hospitalization, he was treated conservatively with antibiotics and despite that his symptoms have remained persistent and has now been admitted to the hospital again.  Underwent surgery on 5/2  Assessment & Plan: Principal Problem:   Sepsis ([place]) Active Problems:   Diverticulitis   Hypertension   Diabetes mellitus without complication ([place])   CKD (chronic kidney disease), stage III ([place])   Hypercholesteremia   CAD S/P percutaneous coronary angioplasty   Hx of CABG   Acute hypokalemia   Preoperative cardiovascular examination   Sepsis associated with apparent diverticular disease/ diverticulitis -Patient has been started on Zosyn, continue, appreciate general surgery input -Status post resection on 5/2, appears to have a mass, pathology pending. 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  Has been ambulating in the hallway.  Pain controlled.  Hypokalemia -Potassium 6.1 yesterday morning, on repeat it was 4.9 -Repeat the BMP tomorrow morning  Type 2 diabetes mellitus -Continue Lantus and sliding scale, had hypoglycemia overnight with a glucose of 66, will decrease Lantus further today -He is hypoglycemic episode likely due to low p.o. intake  Hypertension -IV PRN's  Coronary artery disease status post CABG -No chest pain, fairly good activity level, no orthopnea/fluid overload -He seems to have tolerated  surgery well -Remains chest pain-free and ambulatory  Peripheral vascular disease -Status post femorofemoral bypass and femoropopliteal bypass, appears stable   DVT prophylaxis: SCD Code Status: Full code Family Communication: wife present at bedside Disposition Plan: home when ready   Consultants:   General surgery  Procedures:   Sigmoid colectomy, descending colostomy by Dr. Georgette Dover on 5/2  Antimicrobials:  Zosyn 5/2    Subjective: -No complaints this morning, abdominal pain is controlled, no chest pain, shortness of breath.  Has been walking the hallway without any issues last night.  No nausea or vomiting.  Objective: Vitals:   12/14/17 2125 12/15/17 0133 12/15/17 0500 12/15/17 0547  BP: (!) 171/77 (!) 167/76  (!) 171/78  Pulse: 86 67 80 77  Resp: 18 18  18   Temp: 98.7 F (37.1 C) 97.7 F (36.5 C)  (!) 97.3 F (36.3 C)  TempSrc: Oral Oral  Oral  SpO2:  99%  98%  Weight:   64.4 kg (141 lb 15.6 oz)   Height:        Intake/Output Summary (Last 24 hours) at 12/15/2017 1144 Last data filed at 12/15/2017 0847 Gross per 24 hour  Intake 1848 ml  Output 1850 ml  Net -2 ml   Filed Weights   12/13/17 0950 12/14/17 0500 12/15/17 0500  Weight: 65.3 kg (144 lb) 65 kg (143 lb 4.8 oz) 64.4 kg (141 lb 15.6 oz)    Examination:  Constitutional: NAD Eyes: lids and conjunctivae normal Respiratory: clear to auscultation bilaterally, no wheezing, no crackles. Normal respiratory effort. No accessory muscle use.  Cardiovascular: Regular rate and rhythm, no murmurs / rubs / gallops. No LE edema. 2+ pedal pulses. No carotid bruits.  Abdomen: Expected mild tenderness, ostomy bag in  place, surgical incision without dehiscence, scant old blood on the dressing Skin: no rashes Neurologic: CN 2-12 grossly intact. Strength 5/5 in all 4.  Psychiatric: Normal judgment and insight. Alert and oriented x 3. Normal mood.    Data Reviewed: I have independently reviewed following labs and  imaging studies   CBC: Recent Labs  Lab 12/12/17 1110 12/13/17 0454 12/14/17 0408  WBC 25.3* 8.8 16.6*  NEUTROABS 24.3*  --   --   HGB 12.2* 9.6* 11.2*  HCT 37.8* 30.1* 36.1*  MCV 87.7 87.8 89.4  PLT 403* 312 109*   Basic Metabolic Panel: Recent Labs  Lab 12/12/17 1110 12/13/17 0454 12/14/17 0408 12/14/17 1315  NA 137 136 137  --   K 3.0* 3.5 6.1* 4.9  CL 97* 105 105  --   CO2 25 24 21*  --   GLUCOSE 120* 78 115*  --   BUN 21* 16 14  --   CREATININE 1.37* 1.11 1.26*  --   CALCIUM 9.0 7.9* 8.9  --    GFR: Estimated Creatinine Clearance: 49.2 mL/min (A) (by C-G formula based on SCr of 1.26 mg/dL (H)). Liver Function Tests: Recent Labs  Lab 12/12/17 1110  AST 18  ALT 15*  ALKPHOS 86  BILITOT 0.4  PROT 6.3*  ALBUMIN 3.1*   No results for input(s): LIPASE, AMYLASE in the last 168 hours. No results for input(s): AMMONIA in the last 168 hours. Coagulation Profile: No results for input(s): INR, PROTIME in the last 168 hours. Cardiac Enzymes: No results for input(s): CKTOTAL, CKMB, CKMBINDEX, TROPONINI in the last 168 hours. BNP (last 3 results) No results for input(s): PROBNP in the last 8760 hours. HbA1C: No results for input(s): HGBA1C in the last 72 hours. CBG: Recent Labs  Lab 12/14/17 1958 12/14/17 2335 12/15/17 0353 12/15/17 0430 12/15/17 0816  GLUCAP 119* 99 66 81 68   Lipid Profile: No results for input(s): CHOL, HDL, LDLCALC, TRIG, CHOLHDL, LDLDIRECT in the last 72 hours. Thyroid Function Tests: No results for input(s): TSH, T4TOTAL, FREET4, T3FREE, THYROIDAB in the last 72 hours. Anemia Panel: No results for input(s): VITAMINB12, FOLATE, FERRITIN, TIBC, IRON, RETICCTPCT in the last 72 hours. Urine analysis:    Component Value Date/Time   COLORURINE YELLOW 12/12/2017 [place] 12/12/2017 1602   LABSPEC 1.017 12/12/2017 1602   PHURINE 5.0 12/12/2017 1602   GLUCOSEU >=500 (A) 12/12/2017 1602   HGBUR NEGATIVE 12/12/2017 1602     BILIRUBINUR NEGATIVE 12/12/2017 1602   KETONESUR 5 (A) 12/12/2017 1602   PROTEINUR NEGATIVE 12/12/2017 1602   UROBILINOGEN 0.2 03/16/2015 0120   NITRITE NEGATIVE 12/12/2017 1602   LEUKOCYTESUR NEGATIVE 12/12/2017 1602   Sepsis Labs: Invalid input(s): PROCALCITONIN, LACTICIDVEN  Recent Results (from the past 240 hour(s))  MRSA PCR Screening     Status: None   Collection Time: 12/06/17  5:57 AM  Result Value Ref Range Status   MRSA by PCR NEGATIVE NEGATIVE Final    Comment:        The GeneXpert MRSA Assay (FDA approved for NASAL specimens only), is one component of a comprehensive MRSA colonization surveillance program. It is not intended to diagnose MRSA infection nor to guide or monitor treatment for MRSA infections. Performed at [facility], [address] [place], [place] [address]   Blood Culture (routine x 2)     Status: None (Preliminary result)   Collection Time: 12/12/17 12:50 PM  Result Value Ref Range Status   Specimen Description BLOOD RIGHT  FOREARM  Final   Special Requests   Final    BOTTLES DRAWN AEROBIC AND ANAEROBIC Blood Culture adequate volume   Culture   Final    NO GROWTH 2 DAYS Performed at [facility], [address], [place], [place] [address]    Report Status PENDING  Incomplete  Blood Culture (routine x 2)     Status: None (Preliminary result)   Collection Time: 12/12/17  1:30 PM  Result Value Ref Range Status   Specimen Description BLOOD LEFT ANTECUBITAL  Final   Special Requests   Final    BOTTLES DRAWN AEROBIC AND ANAEROBIC Blood Culture adequate volume   Culture   Final    NO GROWTH 2 DAYS Performed at [facility], [place] [address] [place], [place] [address]    Report Status PENDING  Incomplete  Culture, Urine     Status: None   Collection Time: 12/12/17  4:02 PM  Result Value Ref Range Status   Specimen Description URINE, RANDOM  Final   Special Requests NONE  Final   Culture   Final    NO GROWTH Performed at [facility], [place] [address] [place], [place] [address]    Report Status 12/13/2017 FINAL  Final      Radiology Studies: No results found.   Scheduled Meds: . acetaminophen  650 mg Oral Q6H  . cilostazol  100 mg Oral BID  . insulin aspart  0-9 Units Subcutaneous Q4H  . insulin glargine  16 Units Subcutaneous QHS  . ketorolac  15 mg Intravenous Q6H  . metoprolol tartrate  5 mg Intravenous Q8H  . sodium chloride flush  3 mL Intravenous Q12H  . traMADol  50 mg Oral Q6H   Continuous Infusions: . sodium chloride 75 mL/hr at 12/15/17 0329  . piperacillin-tazobactam (ZOSYN)  IV 3.375 g (12/15/17 0514)    Marzetta Board, MD, PhD [facility] Pager [phone]  If 7PM-7AM, please contact night-coverage [email or web address] Password [email or web address] 12/15/2017, 11:44 AM

## 2017-12-16 DIAGNOSIS — Z933 Colostomy status: Secondary | ICD-10-CM

## 2017-12-16 LAB — BASIC METABOLIC PANEL
ANION GAP: 11 (ref 5–15)
BUN: 9 mg/dL (ref 6–20)
CO2: 23 mmol/L (ref 22–32)
Calcium: 8.4 mg/dL — ABNORMAL LOW (ref 8.9–10.3)
Chloride: 103 mmol/L (ref 101–111)
Creatinine, Ser: 0.97 mg/dL (ref 0.61–1.24)
GFR calc Af Amer: >60 mL/min (ref >60)
GLUCOSE: 67 mg/dL (ref 65–99)
Potassium: 4.1 mmol/L (ref 3.5–5.1)
SODIUM: 137 mmol/L (ref 135–145)

## 2017-12-16 LAB — CBC
HCT: 35.2 % — ABNORMAL LOW (ref 39.0–52.0)
Hemoglobin: 11.7 g/dL — ABNORMAL LOW (ref 13.0–17.0)
MCH: 29 pg (ref 26.0–34.0)
MCHC: 33.2 g/dL (ref 30.0–36.0)
MCV: 87.1 fL (ref 78.0–100.0)
PLATELETS: 428 10*3/uL — AB (ref 150–400)
RBC: 4.04 MIL/uL — ABNORMAL LOW (ref 4.22–5.81)
RDW: 15.3 % (ref 11.5–15.5)
WBC: 11 10*3/uL — AB (ref 4.0–10.5)

## 2017-12-16 LAB — GLUCOSE, CAPILLARY
GLUCOSE-CAPILLARY: 71 mg/dL (ref 65–99)
GLUCOSE-CAPILLARY: 107 mg/dL — AB (ref 65–99)
GLUCOSE-CAPILLARY: 172 mg/dL — AB (ref 65–99)
GLUCOSE-CAPILLARY: 186 mg/dL — AB (ref 65–99)
Glucose-Capillary: 64 mg/dL — ABNORMAL LOW (ref 65–99)
Glucose-Capillary: 72 mg/dL (ref 65–99)
Glucose-Capillary: 73 mg/dL (ref 65–99)

## 2017-12-16 MED ORDER — POLYVINYL ALCOHOL 1.4 % OP SOLN
1.0000 [drp] | OPHTHALMIC | Status: DC | PRN
  Administered 2017-12-16: 1 [drp] via OPHTHALMIC
  Filled 2017-12-16: qty 15

## 2017-12-16 MED ORDER — LACTATED RINGERS IV BOLUS: 1000.0000 mL | Freq: Three times a day (TID) | INTRAVENOUS | Status: DC | PRN

## 2017-12-16 MED ORDER — METOPROLOL TARTRATE 25 MG PO TABS
25.0000 mg | ORAL_TABLET | Freq: Two times a day (BID) | ORAL | Status: DC
  Administered 2017-12-16: 25 mg via ORAL
  Filled 2017-12-16: qty 1

## 2017-12-16 MED ORDER — METHOCARBAMOL 1000 MG/10ML IJ SOLN
1000.0000 mg | Freq: Four times a day (QID) | INTRAVENOUS | Status: DC | PRN
  Filled 2017-12-16: qty 10

## 2017-12-16 MED ORDER — PSYLLIUM 95 % PO PACK
1.0000 | PACK | Freq: Two times a day (BID) | ORAL | Status: DC
  Administered 2017-12-16 (×2): 1 via ORAL
  Filled 2017-12-16 (×4): qty 1

## 2017-12-16 MED ORDER — ACETAMINOPHEN 500 MG PO TABS
1000.0000 mg | ORAL_TABLET | Freq: Four times a day (QID) | ORAL | Status: DC
  Administered 2017-12-16 – 2017-12-17 (×4): 1000 mg via ORAL
  Filled 2017-12-16 (×5): qty 2

## 2017-12-16 MED ORDER — POLYETHYLENE GLYCOL 3350 17 G PO PACK: 17.0000 g | PACK | Freq: Two times a day (BID) | ORAL | Status: DC | PRN

## 2017-12-16 MED ORDER — METHOCARBAMOL 500 MG PO TABS: 1000.0000 mg | ORAL_TABLET | Freq: Four times a day (QID) | ORAL | Status: DC | PRN

## 2017-12-16 MED ORDER — GABAPENTIN 300 MG PO CAPS
300.0000 mg | ORAL_CAPSULE | Freq: Two times a day (BID) | ORAL | Status: DC
  Administered 2017-12-16 – 2017-12-17 (×3): 300 mg via ORAL
  Filled 2017-12-16 (×3): qty 1

## 2017-12-16 MED ORDER — ATORVASTATIN CALCIUM 40 MG PO TABS
40.0000 mg | ORAL_TABLET | Freq: Every day | ORAL | Status: DC
  Administered 2017-12-16: 40 mg via ORAL
  Filled 2017-12-16: qty 1

## 2017-12-16 MED ORDER — SACCHAROMYCES BOULARDII 250 MG PO CAPS
250.0000 mg | ORAL_CAPSULE | Freq: Two times a day (BID) | ORAL | Status: DC
  Administered 2017-12-16 – 2017-12-17 (×3): 250 mg via ORAL
  Filled 2017-12-16 (×3): qty 1

## 2017-12-16 MED ORDER — ENOXAPARIN SODIUM 40 MG/0.4ML ~~LOC~~ SOLN
40.0000 mg | SUBCUTANEOUS | Status: DC
  Administered 2017-12-16 – 2017-12-17 (×2): 40 mg via SUBCUTANEOUS
  Filled 2017-12-16 (×2): qty 0.4

## 2017-12-16 MED ORDER — ASPIRIN EC 81 MG PO TBEC
81.0000 mg | DELAYED_RELEASE_TABLET | Freq: Every day | ORAL | Status: DC
  Administered 2017-12-16 – 2017-12-17 (×2): 81 mg via ORAL
  Filled 2017-12-16 (×4): qty 1

## 2017-12-16 MED ORDER — INSULIN GLARGINE 100 UNIT/ML ~~LOC~~ SOLN
12.0000 [IU] | Freq: Every day | SUBCUTANEOUS | Status: DC
  Administered 2017-12-16: 12 [IU] via SUBCUTANEOUS
  Filled 2017-12-16 (×2): qty 0.12

## 2017-12-16 MED ORDER — GLUCERNA SHAKE PO LIQD
237.0000 mL | Freq: Two times a day (BID) | ORAL | Status: DC
  Administered 2017-12-16 – 2017-12-17 (×2): 237 mL via ORAL

## 2017-12-16 MED ORDER — LINACLOTIDE 145 MCG PO CAPS
145.0000 ug | ORAL_CAPSULE | Freq: Every day | ORAL | Status: DC
  Administered 2017-12-17: 145 ug via ORAL
  Filled 2017-12-16: qty 1

## 2017-12-16 MED ORDER — PANTOPRAZOLE SODIUM 40 MG PO TBEC
40.0000 mg | DELAYED_RELEASE_TABLET | Freq: Every day | ORAL | Status: DC
  Administered 2017-12-16 – 2017-12-17 (×2): 40 mg via ORAL
  Filled 2017-12-16 (×2): qty 1

## 2017-12-16 MED ORDER — METOPROLOL TARTRATE 25 MG PO TABS
25.0000 mg | ORAL_TABLET | Freq: Two times a day (BID) | ORAL | Status: DC
  Administered 2017-12-16 – 2017-12-17 (×3): 25 mg via ORAL
  Filled 2017-12-16 (×3): qty 1

## 2017-12-16 MED ORDER — GUAIFENESIN ER 600 MG PO TB12
600.0000 mg | ORAL_TABLET | Freq: Two times a day (BID) | ORAL | Status: DC
  Administered 2017-12-16 – 2017-12-17 (×3): 600 mg via ORAL
  Filled 2017-12-16 (×3): qty 1

## 2017-12-16 NOTE — Progress Notes (Signed)
[facility]/Trauma Progress Note  3 Days Post-Op   Assessment/Plan CAD - Hx of CABG 2017, last echo in 2017 showed EF 50-55% HTN PVD - Hx of fem-fem bypass, also has fem-pop bypass T2DM CKD stage III  Sigmoid diverticulosis? Focal area of sigmoid colonic wall thickening and mass - differential includes chronic abscess vs large diverticulum vs chronically perforated neoplasm - WBC11.0 05/05,afebrile - CEA normal, patient had colonoscopy in 2015 that showed diverticula but no concern for malignancy - S/PSigmoid colectomy, descending colostomy, Dr. Georgette Dover, 05/02  FEN:FLD, IVF VTE: SCDs,lovenox  ID: IV zosyn 5/1>> Foley:yes Follow up:Dr. Georgette Dover  DISPO:advanced to full liquid diet. Start Lovenox. IS. Encourage ambulation.     LOS: 4 days    Subjective:  CC: abdominal pain  Pain continues to improve. He is tolerating clear liquid diet. He denies nausea, vomiting, fever or chills. He states he's had copious gas in his bag but no stool. Wife at bedside. Discussed with patient and wife that path pathology is still pending.  Objective: Vital signs in last 24 hours: Temp:  [97.5 F (36.4 C)-97.6 F (36.4 C)] 97.6 F (36.4 C) (05/05 0413) Pulse Rate:  [80-91] 88 (05/05 0437) Resp:  [17-18] 18 (05/05 0413) BP: (150-201)/(76-105) 175/91 (05/05 0437) SpO2:  [97 %-98 %] 97 % (05/05 0413) Weight:  [65.1 kg (143 lb 8.3 oz)] 65.1 kg (143 lb 8.3 oz) (05/05 0500) Last BM Date: 12/12/17  Intake/Output from previous day: 05/04 0701 - 05/05 0700 In: 2439.3 [P.O.:540; I.V.:1899.3] Out: 2625 [Urine:2625] Intake/Output this shift: Total I/O In: 720 [P.O.:720] Out: -   PE: Gen: Alert, NAD, well-appearing, pleasant Pulm:Rate andeffort normal Abd: Soft,not distended, incisions C/D/I,ostomy pink with sweat and a little air in the bag. Appropriately tender, no signs of peritonitis Skin: warm and dry  Anti-infectives: Anti-infectives (From admission,  onward)   Start     Dose/Rate Route Frequency Ordered Stop   12/13/17 0100  vancomycin (VANCOCIN) 500 mg in sodium chloride 0.9 % 100 mL IVPB  Status:  Discontinued     500 mg 100 mL/hr over 60 Minutes Intravenous Every 12 hours 12/12/17 1250 12/12/17 1714   12/12/17 2200  piperacillin-tazobactam (ZOSYN) IVPB 3.375 g     3.375 g 12.5 mL/hr over 240 Minutes Intravenous Every 8 hours 12/12/17 1250     12/12/17 1700  cefoTEtan (CEFOTAN) 2 g in sodium chloride 0.9 % 100 mL IVPB  Status:  Discontinued     2 g 200 mL/hr over 30 Minutes Intravenous To [facility] 12/12/17 1357 12/12/17 1812   12/12/17 1215  piperacillin-tazobactam (ZOSYN) IVPB 3.375 g     3.375 g 100 mL/hr over 30 Minutes Intravenous  Once 12/12/17 1212 12/12/17 1420   12/12/17 1215  vancomycin (VANCOCIN) IVPB 1000 mg/200 mL premix     1,000 mg 200 mL/hr over 60 Minutes Intravenous  Once 12/12/17 1212 12/12/17 1546      Lab Results:  Recent Labs    12/14/17 0408 12/16/17 0743  WBC 16.6* 11.0*  HGB 11.2* 11.7*  HCT 36.1* 35.2*  PLT 443* 428*   BMET Recent Labs    12/14/17 0408 12/14/17 1315 12/16/17 0743  NA 137  --  137  K 6.1* 4.9 4.1  CL 105  --  103  CO2 21*  --  23  GLUCOSE 115*  --  67  BUN 14  --  9  CREATININE 1.26*  --  0.97  CALCIUM 8.9  --  8.4*   PT/INR No results  for input(s): LABPROT, INR in the last 72 hours. CMP     Component Value Date/Time   NA 137 12/16/2017 0743   K 4.1 12/16/2017 0743   CL 103 12/16/2017 0743   CO2 23 12/16/2017 0743   GLUCOSE 67 12/16/2017 0743   BUN 9 12/16/2017 0743   CREATININE 0.97 12/16/2017 0743   CREATININE 1.28 (H) 04/25/2016 1038   CALCIUM 8.4 (L) 12/16/2017 0743   PROT 6.3 (L) 12/12/2017 1110   ALBUMIN 3.1 (L) 12/12/2017 1110   AST 18 12/12/2017 1110   ALT 15 (L) 12/12/2017 1110   ALKPHOS 86 12/12/2017 1110   BILITOT 0.4 12/12/2017 1110   GFRNONAA >60 12/16/2017 0743   GFRAA >60 12/16/2017 0743   Lipase  No results found for:  LIPASE  Studies/Results: No results found.    Kalman Drape , [facility] 12/16/2017, 10:10 AM  Pager: [phone] Mon-Wed, Friday 7:00am-4:30pm Thurs 7am-11:30am  Consults: [phone]

## 2017-12-16 NOTE — Progress Notes (Signed)
PROGRESS NOTE  [name] CBJ:[id number] DOB: [birth year] DOA: 12/12/2017 PCP: [name], MD   LOS: 4 days   Brief Narrative / Interim history: 72 year old male with history of coronary artery disease status post CABG, hypertension, hyperlipidemia, type 2 diabetes mellitus, peripheral vascular disease status post femoral-popliteal bypass who has been having recurrent left lower quadrant abdominal pain, was diagnosed with diverticulitis and has had multiple evaluations in the recent past including a hospitalization, he was treated conservatively with antibiotics and despite that his symptoms have remained persistent and has now been admitted to the hospital again.  Underwent surgery on 5/2  Assessment & Plan: Principal Problem:   Sepsis ([place]) Active Problems:   Diverticulitis   Hypertension   Diabetes mellitus without complication ([place])   CKD (chronic kidney disease), stage III ([place])   Hypercholesteremia   CAD S/P percutaneous coronary angioplasty   Hx of CABG   Acute hypokalemia   Preoperative cardiovascular examination   Sepsis associated with apparent diverticular disease/ diverticulitis -Patient has been started on Zosyn, continue, appreciate general surgery input -Status post resection on 5/2, appears to have a mass, pathology pending. 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  Has not seen any output in the bag.  Continues to ambulate in the hallway  Hypokalemia -K within normal parameters at 4.1  Type 2 diabetes mellitus -Continue Lantus and sliding scale, again had an episode of hypoglycemia, decrease Lantus again today -Continues to have poor p.o. intake  Hypertension -Is able to take p.o., resume home metoprolol as he has been on the hypertensive side, discontinue IV metoprolol  Coronary artery disease status post CABG -No chest pain, fairly good activity level, no orthopnea/fluid overload -He seems to have tolerated surgery  well -Continues to ambulate without difficulties  Peripheral vascular disease -Status post femorofemoral bypass and femoropopliteal bypass, appears stable   DVT prophylaxis: SCD Code Status: Full code Family Communication: wife present at bedside Disposition Plan: home when ready   Consultants:   General surgery  Procedures:   Sigmoid colectomy, descending colostomy by Dr. Georgette Dover on 5/2  Antimicrobials:  Zosyn 5/2    Subjective: -He feels well, abdominal pain is improved, denies any nausea or vomiting.  He does well with clears but is tired of the taste and does not eat much.  No chest pain, no shortness of breath  Objective: Vitals:   12/15/17 2121 12/16/17 0413 12/16/17 0437 12/16/17 0500  BP: (!) 189/95 (!) 201/105 (!) 175/91   Pulse: 88 91 88   Resp: 18 18    Temp:  97.6 F (36.4 C)    TempSrc:  Oral    SpO2: 98% 97%    Weight:    65.1 kg (143 lb 8.3 oz)  Height:        Intake/Output Summary (Last 24 hours) at 12/16/2017 0932 Last data filed at 12/16/2017 1607 Gross per 24 hour  Intake 2856.25 ml  Output 1725 ml  Net 1131.25 ml   Filed Weights   12/14/17 0500 12/15/17 0500 12/16/17 0500  Weight: 65 kg (143 lb 4.8 oz) 64.4 kg (141 lb 15.6 oz) 65.1 kg (143 lb 8.3 oz)    Examination:  Constitutional: No distress, comfortable Eyes: No scleral icterus Respiratory: Clear to auscultation bilaterally without wheezing or crackles. Cardiovascular: Regular rate and rhythm without murmurs.  No edema Abdomen: Ostomy bag in place, surgical incision stable.  Bowel sounds positive Skin: No rashes seen Neurologic: Nonfocal   Data Reviewed: I  have independently reviewed following labs and imaging studies   CBC: Recent Labs  Lab 12/12/17 1110 12/13/17 0454 12/14/17 0408 12/16/17 0743  WBC 25.3* 8.8 16.6* 11.0*  NEUTROABS 24.3*  --   --   --   HGB 12.2* 9.6* 11.2* 11.7*  HCT 37.8* 30.1* 36.1* 35.2*  MCV 87.7 87.8 89.4 87.1  PLT 403* 312 443* 428*   Basic  Metabolic Panel: Recent Labs  Lab 12/12/17 1110 12/13/17 0454 12/14/17 0408 12/14/17 1315 12/16/17 0743  NA 137 136 137  --  137  K 3.0* 3.5 6.1* 4.9 4.1  CL 97* 105 105  --  103  CO2 25 24 21*  --  23  GLUCOSE 120* 78 115*  --  67  BUN 21* 16 14  --  9  CREATININE 1.37* 1.11 1.26*  --  0.97  CALCIUM 9.0 7.9* 8.9  --  8.4*   GFR: Estimated Creatinine Clearance: 63.9 mL/min (by C-G formula based on SCr of 0.97 mg/dL). Liver Function Tests: Recent Labs  Lab 12/12/17 1110  AST 18  ALT 15*  ALKPHOS 86  BILITOT 0.4  PROT 6.3*  ALBUMIN 3.1*   No results for input(s): LIPASE, AMYLASE in the last 168 hours. No results for input(s): AMMONIA in the last 168 hours. Coagulation Profile: No results for input(s): INR, PROTIME in the last 168 hours. Cardiac Enzymes: No results for input(s): CKTOTAL, CKMB, CKMBINDEX, TROPONINI in the last 168 hours. BNP (last 3 results) No results for input(s): PROBNP in the last 8760 hours. HbA1C: No results for input(s): HGBA1C in the last 72 hours. CBG: Recent Labs  Lab 12/15/17 2008 12/16/17 0002 12/16/17 0407 12/16/17 0742 12/16/17 0759  GLUCAP 116* 72 73 64* 71   Lipid Profile: No results for input(s): CHOL, HDL, LDLCALC, TRIG, CHOLHDL, LDLDIRECT in the last 72 hours. Thyroid Function Tests: No results for input(s): TSH, T4TOTAL, FREET4, T3FREE, THYROIDAB in the last 72 hours. Anemia Panel: No results for input(s): VITAMINB12, FOLATE, FERRITIN, TIBC, IRON, RETICCTPCT in the last 72 hours. Urine analysis:    Component Value Date/Time   COLORURINE YELLOW 12/12/2017 [place] 12/12/2017 1602   LABSPEC 1.017 12/12/2017 1602   PHURINE 5.0 12/12/2017 1602   GLUCOSEU >=500 (A) 12/12/2017 1602   HGBUR NEGATIVE 12/12/2017 1602   BILIRUBINUR NEGATIVE 12/12/2017 1602   KETONESUR 5 (A) 12/12/2017 1602   PROTEINUR NEGATIVE 12/12/2017 1602   UROBILINOGEN 0.2 03/16/2015 0120   NITRITE NEGATIVE 12/12/2017 1602    LEUKOCYTESUR NEGATIVE 12/12/2017 1602   Sepsis Labs: Invalid input(s): PROCALCITONIN, LACTICIDVEN  Recent Results (from the past 240 hour(s))  Blood Culture (routine x 2)     Status: None (Preliminary result)   Collection Time: 12/12/17 12:50 PM  Result Value Ref Range Status   Specimen Description BLOOD RIGHT FOREARM  Final   Special Requests   Final    BOTTLES DRAWN AEROBIC AND ANAEROBIC Blood Culture adequate volume   Culture   Final    NO GROWTH 3 DAYS Performed at [facility], [address], [place], [place] [address]    Report Status PENDING  Incomplete  Blood Culture (routine x 2)     Status: None (Preliminary result)   Collection Time: 12/12/17  1:30 PM  Result Value Ref Range Status   Specimen Description BLOOD LEFT ANTECUBITAL  Final   Special Requests   Final    BOTTLES DRAWN AEROBIC AND ANAEROBIC Blood Culture adequate volume   Culture   Final  NO GROWTH 3 DAYS Performed at [facility], [place] [address] [place], [place] [address]    Report Status PENDING  Incomplete  Culture, Urine     Status: None   Collection Time: 12/12/17  4:02 PM  Result Value Ref Range Status   Specimen Description URINE, RANDOM  Final   Special Requests NONE  Final   Culture   Final    NO GROWTH Performed at [facility], [place] [address], [place], [place] [address]    Report Status 12/13/2017 FINAL  Final      Radiology Studies: No results found.   Scheduled Meds: . acetaminophen  650 mg Oral Q6H  . cilostazol  100 mg Oral BID  . insulin aspart  0-9 Units Subcutaneous Q4H  . insulin glargine  16 Units Subcutaneous QHS  . ketorolac  15 mg Intravenous Q6H  . metoprolol tartrate  25 mg Oral BID  . sodium chloride flush  3 mL Intravenous Q12H  . traMADol  50 mg Oral Q6H   Continuous Infusions: . sodium chloride 75 mL/hr at 12/16/17 0700  . piperacillin-tazobactam (ZOSYN)  IV 3.375 g (12/16/17 0659)    Marzetta Board, MD, PhD [facility] Pager  [phone]  If 7PM-7AM, please contact night-coverage [email or web address] Password [facility] 12/16/2017, 9:32 AM

## 2017-12-16 NOTE — Telephone Encounter (Signed)
Noted. Hopefully path will show nonmalignant disease per

## 2017-12-17 DIAGNOSIS — Z933 Colostomy status: Secondary | ICD-10-CM

## 2017-12-17 DIAGNOSIS — K5792 Diverticulitis of intestine, part unspecified, without perforation or abscess without bleeding: Secondary | ICD-10-CM

## 2017-12-17 LAB — CULTURE, BLOOD (ROUTINE X 2)
CULTURE: NO GROWTH
Culture: NO GROWTH
Special Requests: ADEQUATE
Special Requests: ADEQUATE

## 2017-12-17 LAB — GLUCOSE, CAPILLARY
GLUCOSE-CAPILLARY: 192 mg/dL — AB (ref 65–99)
GLUCOSE-CAPILLARY: 98 mg/dL (ref 65–99)
GLUCOSE-CAPILLARY: 98 mg/dL (ref 65–99)
Glucose-Capillary: 136 mg/dL — ABNORMAL HIGH (ref 65–99)
Glucose-Capillary: 228 mg/dL — ABNORMAL HIGH (ref 65–99)

## 2017-12-17 MED ORDER — OXYCODONE HCL 5 MG PO TABS: 5.0000 mg | ORAL_TABLET | Freq: Four times a day (QID) | ORAL | 0 refills | Status: DC | PRN

## 2017-12-17 NOTE — Progress Notes (Signed)
Discharge paperwork reviewed with patient. Prescription given to patient. Patient is ready for discharge. No questions verbalized.

## 2017-12-17 NOTE — Discharge Summary (Signed)
Physician Discharge Summary  [name] DGU:[id number] DOB: 1946-05-02 DOA: 12/12/2017  PCP: [name], MD  Admit date: 12/12/2017 Discharge date: 12/17/2017  Admitted From: home Disposition:  Home  Recommendations for Outpatient Follow-up:  1. Follow up with PCP in 1-2 weeks 2. Follow-up with general surgery office as scheduled 3. Please follow up on the following pending results: Surgical pathology  Home Health: none Equipment/Devices: none  Discharge Condition: stable CODE STATUS: Full code Diet recommendation: Heart healthy  HPI: Per Dr. Lorin Mercy, [name] is a 72 y.o. male with a Past Medical History of PVD s/p fem/pop bypass; HTN; HLD; CAD s/p CABG x 5; DM; stage 3 CKD and multiple recent hospitalizations.  He initially presented 10/18-20 for sepsis physiology after a dental procedure.  He was brought on back on 10/23 due to bacteremia of uncertain etiology with Enterobacter and pan-sensitive E coli and was discharged later that day on Augmentin.  He returned 12/19-21 with sepsis looking for potential sources of E coli without clear etiology.  He was again hospitalized from 1/3-6 for sepsis and this time it was determined that the source of his infection was sigmoid diverticulitis.  He was recommended to have colonoscopy once the infection was resolved and IR and surgery both declined surgical intervention.  He was hospitalized once again from 2/18-20 for sepsis, this time thought to be due to HCAP.  He appears to have been seen by GI and/or surgery as an outpatient in the interim.  He was then hospitalized again from 4/25-26 for hematochezia thought to be related to a diverticular bleed with pain from diverticulitis.  He was discharged on Cipro/Flagyl and returns today with sepsis physiology and LLQ pain  Hospital Course: Sepsis associated with apparent diverticular disease/ diverticulitis -Patient has been started on Zosyn on admission.  General surgery was consulted, and  given her recurrent disease patient was taken to the operating room on 5/2 and underwent sigmoid colectomy, descending colostomy by Dr. Georgette Dover.  Intraoperative findings were pertinent for a mass which was sent for pathology, and it is pending at the time of discharge.  Please follow-up as an outpatient for final results.  He recovered well postoperatively, his diet was advanced and he is now tolerating a regular diet, his ostomy seems to be functioning well.  Patient and his wife received teaching and education from the ostomy nurse and will be discharged home in stable condition. His abdominal pain is minimal, well controlled on oral medications. Hypokalemia -resolved Type 2 diabetes mellitus -Continue home regimen   Hypertension -continue home regimen  Coronary artery disease status post CABG -No chest pain, fairly good activity level, no orthopnea/fluid overload -He seems to have tolerated surgery well -Continues to ambulate without difficulties Peripheral vascular disease -Status post femorofemoral bypass and femoropopliteal bypass, appears stable    Discharge Diagnoses:  Principal Problem:   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 Active Problems:   Sepsis ([place])   Hypertension   Diabetes mellitus without complication ([place])   CKD (chronic kidney disease), stage III ([place])   Hypercholesteremia   CAD S/P percutaneous coronary angioplasty   Hx of CABG   Acute hypokalemia   Preoperative cardiovascular examination   Colostomy in place [facility])   Discharge Instructions   Allergies as of 12/17/2017   No Active Allergies     Medication List    STOP taking these medications   metroNIDAZOLE 500 MG tablet Commonly known as:  FLAGYL     TAKE these medications  albuterol 108 (90 Base) MCG/ACT inhaler Commonly known as:  PROVENTIL HFA;VENTOLIN HFA Inhale 2 puffs into the lungs every 6 (six) hours as needed for wheezing or shortness of breath.   aspirin 81 MG  tablet Take 81 mg by mouth daily.   cilostazol 100 MG tablet Commonly known as:  PLETAL Take 100 mg by mouth 2 (two) times daily.   ferrous sulfate 325 (65 FE) MG tablet Take 325 mg by mouth daily.   gabapentin 300 MG capsule Commonly known as:  NEURONTIN Take 300-600 mg by mouth See admin instructions. 300mg  in the morning and 600mg  at bedtime   guaiFENesin 600 MG 12 hr tablet Commonly known as:  MUCINEX Take 1 tablet (600 mg total) by mouth 2 (two) times daily.   ibuprofen 200 MG tablet Commonly known as:  ADVIL,MOTRIN Take 200 mg by mouth every 6 (six) hours as needed for moderate pain.   insulin glargine 100 UNIT/ML injection Commonly known as:  LANTUS Inject 0.2 mLs (20 Units total) into the skin at bedtime. What changed:  how much to take   linaclotide 145 MCG Caps capsule Commonly known as:  LINZESS Take 1 capsule (145 mcg total) by mouth daily before breakfast.   metoprolol tartrate 25 MG tablet Commonly known as:  LOPRESSOR Take 25 mg by mouth 2 (two) times daily.   oxyCODONE 5 MG immediate release tablet Commonly known as:  Oxy IR/ROXICODONE Take 1 tablet (5 mg total) by mouth every 6 (six) hours as needed for severe pain.   pantoprazole 40 MG tablet Commonly known as:  PROTONIX Take 1 tablet (40 mg total) by mouth daily.   simvastatin 80 MG tablet Commonly known as:  ZOCOR Take 40 mg by mouth at bedtime.   vitamin B-12 1000 MCG tablet Commonly known as:  CYANOCOBALAMIN Take 1,000 mcg by mouth daily.   Vitamin D 2000 units tablet Take 2,000 Units by mouth daily.      Follow-up Information    Donnie Mesa, MD. Go on 12/28/2017.   Specialty:  General Surgery Why:  Your appointment is 12/28/17 at 9:00AM to follow up from your recent surgery. Please arrive 30 minutes prior to your appointment to check in and fill out paperwork. Bring photo ID and insurance information. Contact information: [address] [place]  [address] [phone]        [facility] - Dme Follow up.   Why:  Home health nurse Contact information: [place] [address] [phone]        Daneil Dolin, MD. Call in 1 day(s).   Specialty:  Gastroenterology Why:  for a follow up appointment  Contact information: [address] [place] [address] [phone]           Consultations:  General surgery  Cardiology  Procedures/Studies:  Sigmoid colectomy, descending colostomy by Dr. Georgette Dover on 5/2   Ct Abdomen Pelvis Wo Contrast  Result Date: 12/12/2017 CLINICAL DATA:  Abdominal pain and fever, abscess suspected EXAM: CT ABDOMEN AND PELVIS WITHOUT CONTRAST TECHNIQUE: Multidetector CT imaging of the abdomen and pelvis was performed following the standard protocol without IV contrast. COMPARISON:  12/06/2017 CT FINDINGS: Lower chest: Top-normal size heart with coronary arteriosclerosis and aortic atherosclerosis. No pericardial effusion. Dependent atelectasis is identified at the lung bases. Subsegmental atelectasis and/or scarring is seen in the lingula. Hepatobiliary: The liver is unremarkable for a noncontrast study. No definite space-occupying mass is identified. There is no biliary dilatation. Gallbladder  is physiologically distended. Punctate calculus is seen along the dependent wall. No secondary signs of acute cholecystitis. Pancreas: Atrophic pancreas without ductal dilatation. Spleen: Normal size spleen without acute abnormality. Adrenals/Urinary Tract: Normal bilateral adrenal glands. Stable bilateral renal cysts and renovascular calcifications within both kidneys. Dense atherosclerotic calcifications at the origins both renal arteries. No significant cortical scarring. No nephrolithiasis nor obstructive uropathy. The largest simple cyst is seen in the upper pole of the right kidney measuring 4.5 cm with largest cyst in the lower pole the left kidney measuring 1.1 cm. No  significant change. There is minimal cortical scarring of the left kidney. The urinary bladder is physiologically distended without focal mural thickening or calculi. Stomach/Bowel: Redemonstration of short segmental thickening of the sigmoid with large giant diverticulum suspected containing stool off the affected sigmoid. Mild fat stranding is seen about the presumed large diverticulum. Moderate sigmoid diverticulosis is otherwise noted with a moderate amount of retained stool and contrast in the colon. No small bowel dilatation or inflammation. Decompressed stomach is demonstrated. Vascular/Lymphatic: Marked aortoiliac and branch vessel atherosclerosis without aneurysm. Chronic infrarenal aortic dissection. No lymphadenopathy by CT size criteria. A few small mesenteric nodes are seen adjacent to the large sigmoid outpouching Reproductive: Normal size prostate with a few peripheral zone calcifications. Other: No intraperitoneal free fluid nor free air. Musculoskeletal: Status post median sternotomy. Moderate right and small to moderate left fat containing inguinal hernias with surgical clips in the left inguinal soft tissues compatible with prior vascular access. Bilateral fem-fem bypass graft noted. IMPRESSION: 1. Redemonstration of large sigmoid outpouching with inflammatory change similar to prior without evidence of free intraperitoneal air, fluid or bowel obstruction. Differential considerations remain inflamed giant sigmoid diverticulum, stigmata of a chronic abscess though unchanged in appearance from recent comparison, ulcerated neoplasm among some possibilities though not exclusive. Favor giant sigmoid diverticulum. No significant progression however is seen since prior. 2. Marked aortoiliac and branch vessel atherosclerosis. 3. Stable bilateral renal cysts. 4. Punctate calculus suspected along the dependent wall of the gallbladder on current study. Electronically Signed   By: Ashley Royalty M.D.   On:  12/12/2017 18:33   Ct Abdomen Pelvis Wo Contrast  Result Date: 12/06/2017 CLINICAL DATA:  Bloody bowel movement. History of diverticulitis, fem-fem bypass, fem-pop bypass. EXAM: CT ABDOMEN AND PELVIS WITHOUT CONTRAST TECHNIQUE: Multidetector CT imaging of the abdomen and pelvis was performed following the standard protocol without IV contrast. COMPARISON:  CT abdomen and pelvis November 26, 2017 and August 17, 2017 FINDINGS: LOWER CHEST: Lung bases are clear. The visualized heart size is normal. Severe coronary artery calcifications and/or stents. No pericardial effusion. HEPATOBILIARY: Normal. PANCREAS: Nonacute.  Atrophic. SPLEEN: Normal. ADRENALS/URINARY TRACT: Kidneys are orthotopic, demonstrating normal size and morphology. Bilateral vascular calcifications. 4.1 cm benign-appearing RIGHT upper pole cyst. 1 cm cyst LEFT upper pole. Too small to characterize hypodensities bilateral kidneys. Focal scarring LEFT kidney. No nephrolithiasis, hydronephrosis; limited assessment for renal masses on this nonenhanced examination. The unopacified ureters are normal in course and caliber. Urinary bladder is well distended and unremarkable. Normal adrenal glands. STOMACH/BOWEL: Short segment of sigmoid wall thickening, large stool filled outpouching with similar surrounding fat stranding. Numerous tiny surrounding lymph nodes. Moderate sigmoid diverticulosis. Moderate amount of retained large bowel stool. Small amount of small bowel feces compatible with chronic stasis. The stomach, small bowel are normal in course and caliber without inflammatory changes, sensitivity decreased by lack of enteric contrast. Normal appendix. VASCULAR/LYMPHATIC: Aortoiliac vessels are normal in course and caliber. Severe calcific atherosclerosis.  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 No lymphadenopathy by CT size criteria. REPRODUCTIVE: Normal. OTHER: No intraperitoneal free fluid or free air. MUSCULOSKELETAL: Non-acute. Status post median  sternotomy. Moderate RIGHT and small to moderate LEFT fat containing inguinal hernias. Surgical clips LEFT inguinal soft tissues compatible with previous vascular access. Fem-fem bypass noted. Osteopenia. IMPRESSION: 1. Similar appearance of focal sigmoid colonic wall thickening and large stool filled inflamed outpouching. Differential diagnosis includes chronic abscess, giant diverticulum, chronically perforated neoplasm. Recommend direct inspection if not imaged during proctoscopy October 24, 2017. Aortic Atherosclerosis (ICD10-I70.0). Electronically Signed   By: Elon Alas M.D.   On: 12/06/2017 02:27   Ct Chest Wo Contrast  Result Date: 11/27/2017 CLINICAL DATA:  Follow-up lung nodule, right-sided chest pain following fall 2 days ago, initial encounter EXAM: CT CHEST WITHOUT CONTRAST TECHNIQUE: Multidetector CT imaging of the chest was performed following the standard protocol without IV contrast. COMPARISON:  08/17/2017, 12/05/2016, 10/13/2015 FINDINGS: Cardiovascular: Atherosclerotic changes of the aorta are noted. Changes of prior coronary bypass grafting are seen. No cardiac enlargement is seen. No pericardial fluid is noted. Mediastinum/Nodes: Thoracic inlet is unremarkable. No hilar or mediastinal adenopathy is seen. The esophagus is unremarkable. Lungs/Pleura: Lungs are well aerated bilaterally. Scattered areas of ground-glass density are identified throughout the right lung. These are new from the prior exam. These likely represent postinflammatory change. Previously seen nodule in the right middle lobe is stable in appearance when compared with previous exams dating back to 12/05/2016. No other significant nodules are noted. Upper Abdomen: Visualized upper abdomen demonstrates right renal cyst stable from the previous exam. Musculoskeletal: Degenerative changes of the thoracic spine are noted. No acute rib abnormality is noted. IMPRESSION: Previously seen right middle lobe nodular changes are  stable from previous exam dating back to 10/13/2015 Scattered ground-glass densities are noted throughout the right lung likely postinflammatory in nature and improved from previous exams. No new focal infiltrate is seen. No acute rib abnormality is noted. Aortic Atherosclerosis (ICD10-I70.0). Electronically Signed   By: Inez Catalina M.D.   On: 11/27/2017 12:41   Ct Abdomen Pelvis W Contrast  Result Date: 11/26/2017 CLINICAL DATA:  Left lower quadrant abdominal fullness. EXAM: CT ABDOMEN AND PELVIS WITH CONTRAST TECHNIQUE: Multidetector CT imaging of the abdomen and pelvis was performed using the standard protocol following bolus administration of intravenous contrast. CONTRAST:  46mL ISOVUE-300 IOPAMIDOL (ISOVUE-300) INJECTION 61% COMPARISON:  08/17/2017 PET CT 11/30/2015.  CT abdomen 02/02/2005. FINDINGS: Lower chest: Normal Hepatobiliary: Normal Pancreas: Normal Spleen: Normal Adrenals/Urinary Tract: Adrenal glands are normal. Mild renal atrophy, left more than right. Bilateral renal cysts, the largest on the right measuring up to 4.5 cm in diameter. Extensive renal vascular calcification. Stomach/Bowel: Large amount of fecal matter in the colon. As seen previously, the patient has chronic diverticulosis throughout the sigmoid colon. At the proximal sigmoid, there is an approximately 7 cm region containing stool, in communication with the main lumen of the sigmoid colon, with mild surrounding inflammatory change. This could represent a giant diverticulum containing stool or could represent a chronic contained walled-off perforation an abscess in wide communication with sigmoid lumen. Vascular/Lymphatic: Aortic atherosclerosis. No aneurysm. Advanced aortic branch vessel atherosclerosis. Right to left femoral to femoral bypass. Reproductive: Normal Other: No free fluid or air. Musculoskeletal: Negative IMPRESSION: The area of clinical concern corresponds to an approximately 7 cm in diameter abnormality arising  from the sigmoid colon which contains stool and contrast, apparently in wide communication with the sigmoid colon. The differential diagnosis is giant diverticulum versus  chronic walled-off communicating cavity related to previous diverticulitis and abscess. Patient does have extensive diverticulosis of the sigmoid colon in that region without definite acute diverticulitis. The finding is similar to previous scans going back as far as April of 2017. Advanced aortic atherosclerosis and atherosclerosis of the branch vessels. Electronically Signed   By: Nelson Chimes M.D.   On: 11/26/2017 14:24   Dg Chest Port 1 View  Result Date: 12/12/2017 CLINICAL DATA:  Leukocytosis EXAM: PORTABLE CHEST 1 VIEW COMPARISON:  10/01/2017 FINDINGS: Postop CABG without heart failure or edema. No pleural effusion. Negative for pneumonia IMPRESSION: No active disease. Electronically Signed   By: Franchot Gallo M.D.   On: 12/12/2017 13:49     Subjective: - no chest pain, shortness of breath, no abdominal pain, nausea or vomiting.   Discharge Exam: Vitals:   12/17/17 0448 12/17/17 1348  BP: (!) 183/86 (!) 149/104  Pulse: 65 82  Resp: 16 17  Temp: 97.7 F (36.5 C)   SpO2: 98% 97%    General: Pt is alert, awake, not in acute distress Cardiovascular: RRR, S1/S2 +, no rubs, no gallops Respiratory: CTA bilaterally, no wheezing, no rhonchi Abdominal: Soft, NT, ND, bowel sounds +, ostomy bag in place Extremities: no edema, no cyanosis    The results of significant diagnostics from this hospitalization (including imaging, microbiology, ancillary and laboratory) are listed below for reference.     Microbiology: Recent Results (from the past 240 hour(s))  Blood Culture (routine x 2)     Status: None   Collection Time: 12/12/17 12:50 PM  Result Value Ref Range Status   Specimen Description BLOOD RIGHT FOREARM  Final   Special Requests   Final    BOTTLES DRAWN AEROBIC AND ANAEROBIC Blood Culture adequate volume    Culture   Final    NO GROWTH 5 DAYS Performed at [facility], [address] [place], [place] [address]    Report Status 12/17/2017 FINAL  Final  Blood Culture (routine x 2)     Status: None   Collection Time: 12/12/17  1:30 PM  Result Value Ref Range Status   Specimen Description BLOOD LEFT ANTECUBITAL  Final   Special Requests   Final    BOTTLES DRAWN AEROBIC AND ANAEROBIC Blood Culture adequate volume   Culture   Final    NO GROWTH 5 DAYS Performed at [facility], [place] [address] [place], [place] [address]    Report Status 12/17/2017 FINAL  Final  Culture, Urine     Status: None   Collection Time: 12/12/17  4:02 PM  Result Value Ref Range Status   Specimen Description URINE, RANDOM  Final   Special Requests NONE  Final   Culture   Final    NO GROWTH Performed at [facility], [place] [address] [place], [place] [address]    Report Status 12/13/2017 FINAL  Final     Labs: BNP (last 3 results) Recent Labs    08/16/17 2123 10/01/17 1805  BNP 47.0 21.3   Basic Metabolic Panel: Recent Labs  Lab 12/12/17 1110 12/13/17 0454 12/14/17 0408 12/14/17 1315 12/16/17 0743  NA 137 136 137  --  137  K 3.0* 3.5 6.1* 4.9 4.1  CL 97* 105 105  --  103  CO2 25 24 21*  --  23  GLUCOSE 120* 78 115*  --  67  BUN 21* 16 14  --  9  CREATININE 1.37* 1.11 1.26*  --  0.97  CALCIUM 9.0 7.9* 8.9  --  8.4*   Liver Function Tests: Recent Labs  Lab 12/12/17 1110  AST 18  ALT 15*  ALKPHOS 86  BILITOT 0.4  PROT 6.3*  ALBUMIN 3.1*   No results for input(s): LIPASE, AMYLASE in the last 168 hours. No results for input(s): AMMONIA in the last 168 hours. CBC: Recent Labs  Lab 12/12/17 1110 12/13/17 0454 12/14/17 0408 12/16/17 0743  WBC 25.3* 8.8 16.6* 11.0*  NEUTROABS 24.3*  --   --   --   HGB 12.2* 9.6* 11.2* 11.7*  HCT 37.8* 30.1* 36.1* 35.2*  MCV 87.7 87.8 89.4 87.1  PLT 403* 312 443* 428*   Cardiac Enzymes: No results for input(s): CKTOTAL, CKMB,  CKMBINDEX, TROPONINI in the last 168 hours. BNP: Invalid input(s): POCBNP CBG: Recent Labs  Lab 12/16/17 2003 12/17/17 0043 12/17/17 0445 12/17/17 0757 12/17/17 1205  GLUCAP 186* 192* 136* 98 228*   D-Dimer No results for input(s): DDIMER in the last 72 hours. Hgb A1c No results for input(s): HGBA1C in the last 72 hours. Lipid Profile No results for input(s): CHOL, HDL, LDLCALC, TRIG, CHOLHDL, LDLDIRECT in the last 72 hours. Thyroid function studies No results for input(s): TSH, T4TOTAL, T3FREE, THYROIDAB in the last 72 hours.  Invalid input(s): FREET3 Anemia work up No results for input(s): VITAMINB12, FOLATE, FERRITIN, TIBC, IRON, RETICCTPCT in the last 72 hours. Urinalysis    Component Value Date/Time   COLORURINE YELLOW 12/12/2017 1602   APPEARANCEUR CLEAR 12/12/2017 1602   LABSPEC 1.017 12/12/2017 1602   PHURINE 5.0 12/12/2017 1602   GLUCOSEU >=500 (A) 12/12/2017 1602   HGBUR NEGATIVE 12/12/2017 1602   BILIRUBINUR NEGATIVE 12/12/2017 1602   KETONESUR 5 (A) 12/12/2017 1602   PROTEINUR NEGATIVE 12/12/2017 1602   UROBILINOGEN 0.2 03/16/2015 0120   NITRITE NEGATIVE 12/12/2017 1602   LEUKOCYTESUR NEGATIVE 12/12/2017 1602   Sepsis Labs Invalid input(s): PROCALCITONIN,  WBC,  LACTICIDVEN   Time coordinating discharge: 35 minutes  SIGNED:  Marzetta Board, MD  [facility] 12/17/2017, 3:52 PM Pager [phone]  If 7PM-7AM, please contact night-coverage [email or web address] Password [email or web address]

## 2017-12-17 NOTE — Progress Notes (Addendum)
[facility] Progress Note  4 Days Post-Op  Subjective: CC-  No complaints this morning. Patient was started on a heart healthy diet last night and has tolerated this well for 2 meals. Denies n/v. Colostomy functioning. Pain well controlled on oral medications. Worked with [place] RN yesterday, per patient she is returning today for more colostomy teaching.  Objective: Vital signs in last 24 hours: Temp:  [97.7 F (36.5 C)-97.8 F (36.6 C)] 97.7 F (36.5 C) (05/06 0448) Pulse Rate:  [65-81] 65 (05/06 0448) Resp:  [16-19] 16 (05/06 0448) BP: (134-183)/(66-93) 183/86 (05/06 0448) SpO2:  [97 %-100 %] 98 % (05/06 0448) Weight:  [156 lb 12 oz (71.1 kg)] 156 lb 12 oz (71.1 kg) (05/06 0500) Last BM Date: 12/16/17  Intake/Output from previous day: 05/05 0701 - 05/06 0700 In: 1260 [P.O.:960; IV Piggyback:300] Out: 1550 [Urine:1550] Intake/Output this shift: No intake/output data recorded.  PE: Gen:  Alert, NAD, pleasant HEENT: EOM's intact, pupils equal and round Pulm:  effort normal Abd: Soft, ND, NT, +BS, midline incision C/D/I with staples intact and no erythema or drainage, ostomy pink with liquid brown stool in bag  Lab Results:  Recent Labs    12/16/17 0743  WBC 11.0*  HGB 11.7*  HCT 35.2*  PLT 428*   BMET Recent Labs    12/14/17 1315 12/16/17 0743  NA  --  137  K 4.9 4.1  CL  --  103  CO2  --  23  GLUCOSE  --  67  BUN  --  9  CREATININE  --  0.97  CALCIUM  --  8.4*   PT/INR No results for input(s): LABPROT, INR in the last 72 hours. CMP     Component Value Date/Time   NA 137 12/16/2017 0743   K 4.1 12/16/2017 0743   CL 103 12/16/2017 0743   CO2 23 12/16/2017 0743   GLUCOSE 67 12/16/2017 0743   BUN 9 12/16/2017 0743   CREATININE 0.97 12/16/2017 0743   CREATININE 1.28 (H) 04/25/2016 1038   CALCIUM 8.4 (L) 12/16/2017 0743   PROT 6.3 (L) 12/12/2017 1110   ALBUMIN 3.1 (L) 12/12/2017 1110   AST 18 12/12/2017 1110   ALT 15 (L) 12/12/2017  1110   ALKPHOS 86 12/12/2017 1110   BILITOT 0.4 12/12/2017 1110   GFRNONAA >60 12/16/2017 0743   GFRAA >60 12/16/2017 0743   Lipase  No results found for: LIPASE     Studies/Results: No results found.  Anti-infectives: Anti-infectives (From admission, onward)   Start     Dose/Rate Route Frequency Ordered Stop   12/13/17 0100  vancomycin (VANCOCIN) 500 mg in sodium chloride 0.9 % 100 mL IVPB  Status:  Discontinued     500 mg 100 mL/hr over 60 Minutes Intravenous Every 12 hours 12/12/17 1250 12/12/17 1714   12/12/17 2200  piperacillin-tazobactam (ZOSYN) IVPB 3.375 g     3.375 g 12.5 mL/hr over 240 Minutes Intravenous Every 8 hours 12/12/17 1250     12/12/17 1700  cefoTEtan (CEFOTAN) 2 g in sodium chloride 0.9 % 100 mL IVPB  Status:  Discontinued     2 g 200 mL/hr over 30 Minutes Intravenous To [facility] 12/12/17 1357 12/12/17 1812   12/12/17 1215  piperacillin-tazobactam (ZOSYN) IVPB 3.375 g     3.375 g 100 mL/hr over 30 Minutes Intravenous  Once 12/12/17 1212 12/12/17 1420   12/12/17 1215  vancomycin (VANCOCIN) IVPB 1000 mg/200 mL premix     1,000 mg 200 mL/hr  over 60 Minutes Intravenous  Once 12/12/17 1212 12/12/17 1546       Assessment/Plan CAD - Hx of CABG 2017, last echo in 2017 showed EF 50-55% HTN PVD - Hx of fem-fem bypass, also has fem-pop bypass T2DM CKD stage III  Sigmoid diverticulosis? Focal area of sigmoid colonic wall thickening and mass S/PSigmoid colectomy, descending colostomy, Dr. Georgette Dover, 05/02 - differential includes chronic abscess vs large diverticulum vs chronically perforated neoplasm, path pending - CEAnormal, patient had colonoscopy in 2015 that showed diverticula but no concern for malignancy  FEN: HH diet VTE: SCDs,lovenox  ID: IV zosyn 5/1>>day#6 Foley:out Follow up:Dr. Georgette Dover  DISPO:Path still pending. Patient is tolerating a HH diet, ostomy functioning, pain controlled on oral meds. [place] RN to work with patient again  today on colostomy teaching; after this he is stable for discharge from surgical standpoint. He does not need to go home on antibiotics. Discharge instructions and f/u info on AVS. Oxy rx on chart.   LOS: 5 days    [name] , [facility] 12/17/2017, 9:42 AM Pager: [phone] Consults: [phone] Mon-Fri 7:00 am-4:30 pm Sat-Sun 7:00 am-11:30 am

## 2017-12-17 NOTE — Plan of Care (Signed)
  Problem: Health Behavior/Discharge Planning: Goal: Ability to manage health-related needs will improve Outcome: Progressing   Problem: Clinical Measurements: Goal: Will remain free from infection Outcome: Progressing   Problem: Activity: Goal: Risk for activity intolerance will decrease Outcome: Progressing   Problem: Elimination: Goal: Will not experience complications related to bowel motility Outcome: Progressing

## 2017-12-17 NOTE — Discharge Instructions (Signed)
[facility]      [facility] °[phone] ° °OPEN ABDOMINAL SURGERY: POST OP INSTRUCTIONS ° °Always review your discharge instruction sheet given to you by the facility where your surgery was performed. ° °IF YOU HAVE DISABILITY OR FAMILY LEAVE FORMS, YOU MUST BRING THEM TO THE OFFICE FOR PROCESSING.  PLEASE DO NOT GIVE THEM TO YOUR DOCTOR. ° °1. A prescription for pain medication may be given to you upon discharge.  Take your pain medication as prescribed, if needed.  If narcotic pain medicine is not needed, then you may take acetaminophen (Tylenol) or ibuprofen (Advil) as needed. °2. Take your usually prescribed medications unless otherwise directed. °3. If you need a refill on your pain medication, please contact your pharmacy. They will contact our office to request authorization.  Prescriptions will not be filled after 5pm or on week-ends. °4. You should follow a light diet the first few days after arrival home, such as soup and crackers, pudding, etc.unless your doctor has advised otherwise. A high-fiber, low fat diet can be resumed as tolerated.   Be sure to include lots of fluids daily. Most patients will experience some swelling and bruising on the chest and neck area.  Ice packs will help.  Swelling and bruising can take several days to resolve °5. Most patients will experience some swelling and bruising in the area of the incision. Ice pack will help. Swelling and bruising can take several days to resolve..  °6. It is common to experience some constipation if taking pain medication after surgery.  Increasing fluid intake and taking a stool softener will usually help or prevent this problem from occurring.  A mild laxative (Milk of Magnesia or Miralax) should be taken according to package directions if there are no bowel movements after 48 hours. °7.  You may have steri-strips (small skin tapes) in place directly over the incision.  These strips should be left on the skin for 7-10 days.  If your  surgeon used skin glue on the incision, you may shower in 24 hours.  The glue will flake off over the next 2-3 weeks.  Any sutures or staples will be removed at the office during your follow-up visit. You may find that a light gauze bandage over your incision may keep your staples from being rubbed or pulled. You may shower and replace the bandage daily. °8. ACTIVITIES:  You may resume regular (light) daily activities beginning the next day--such as daily self-care, walking, climbing stairs--gradually increasing activities as tolerated.  You may have sexual intercourse when it is comfortable.  Refrain from any heavy lifting or straining until approved by your doctor. °a. You may drive when you no longer are taking prescription pain medication, you can comfortably wear a seatbelt, and you can safely maneuver your car and apply brakes °b. Return to Work: ___________________________________ °9. You should see your doctor in the office for a follow-up appointment approximately two weeks after your surgery.  Make sure that you call for this appointment within a day or two after you arrive home to insure a convenient appointment time. °OTHER INSTRUCTIONS:  °_____________________________________________________________ °_____________________________________________________________ ° °WHEN TO CALL YOUR DOCTOR: °1. Fever over 101.0 °2. Inability to urinate °3. Nausea and/or vomiting °4. Extreme swelling or bruising °5. Continued bleeding from incision. °6. Increased pain, redness, or drainage from the incision. °7. Difficulty swallowing or breathing °8. Muscle cramping or spasms. °9. Numbness or tingling in hands or feet or around lips. ° °The clinic staff is available to   answer your questions during regular business hours.  Please don’t hesitate to call and ask to speak to one of the nurses if you have concerns. ° °For further questions, please visit [email or web address] ° ° °Colostomy, Adult, Care After °Refer to  this sheet in the next few weeks. These instructions provide you with information about caring for yourself after your procedure. Your health care provider may also give you more specific instructions. Your treatment has been planned according to current medical practices, but problems sometimes occur. Call your health care provider if you have any problems or questions after your procedure. °What can I expect after the procedure? °After the procedure, it is common to have: °· Swelling at the opening that was created during the procedure (stoma). °· Slight bleeding around the stoma. °· Redness around the stoma. ° °Follow these instructions at home: °Activity °· Rest as needed while the stoma area heals. °· Return to your normal activities as told by your health care provider. Ask your health care provider what activities are safe for you. °· Avoid strenuous activity and abdominal exercises for 3 weeks or for as long as told by your health care provider. °· Do not lift anything that is heavier than 10 lb (4.5 kg). °Incision care ° °· Follow instructions from your health care provider about how to take care of your incision. Make sure you: °? Wash your hands with soap and water before you change your bandage (dressing). If soap and water are not available, use hand sanitizer. °? Change your dressing as told by your health care provider. °? Leave stitches (sutures), skin glue, or adhesive strips in place. These skin closures may need to stay in place for 2 weeks or longer. If adhesive strip edges start to loosen and curl up, you may trim the loose edges. Do not remove adhesive strips completely unless your health care provider tells you to do that. °Stoma Care °· Keep the stoma area clean. °· Clean and dry the skin around the stoma each time you change the colostomy bag. To clean the stoma area: °? Use warm water and only use cleansers that are recommended by your health care provider. °? Rinse the stoma area with  plain water. °? Dry the area well. °· Use stoma powder or ointment on your skin only as told by your health care provider. Do not use any other powders, gels, wipes, or creams on your skin. °· Check the stoma area every day for signs of infection. Check for: °? More redness, swelling, or pain. °? More fluid or blood. °? Pus or warmth. °· Measure the stoma opening regularly and record the size. Watch for changes. Share this information with your health care provider. °Bathing °· Do not take baths, swim, or use a hot tub until your health care provider approves. Ask your health care provider if you can take showers. You may be able to shower with or without the colostomy bag in place. If you bathe with the bag on, dry the bag afterward. °· Avoid using harsh or oily soaps when you bathe. °Colostomy Bag Care °· Follow instructions from your health care provider about how to empty or change the colostomy bag. °· Keep colostomy supplies with you at all times. °· Store all supplies in a cool, dry place. °· Empty the colostomy bag: °? Whenever it is one-third to one-half full. °? At bedtime. °· Replace the bag every 2-4 days or as told by your health care provider. °Driving °·   Do not drive for 24 hours if you received a sedative. °· Do not drive or operate heavy machinery while taking prescription pain medicine. °General instructions °· Follow instructions from your health care provider about eating or drinking restrictions. °· Take over-the-counter and prescription medicines only as told by your health care provider. °· Avoid wearing clothes that are tight directly over your stoma. °· Do not use any tobacco products, such as cigarettes, chewing tobacco, and e-cigarettes. If you need help quitting, ask your health care provider. °· (Women) Ask your health care provider about becoming pregnant and about using birth control. Medicines may not be absorbed normally after the procedure. °· Keep all follow-up visits as told by  your health care provider. This is important. °Contact a health care provider if: °· You are having trouble caring for your stoma or changing the colostomy bag. °· You feel nauseous or you vomit. °· You have a fever. °· You have more redness, swelling, or pain at the site of your stoma or around your anus. °· You have more fluid or blood coming from your stoma or your anus. °· Your stoma area feels warm to the touch. °· You have pus coming from your stoma. °· You notice a change in the size or appearance of the stoma. °· You have abdominal pain, bloating, pressure, or cramping. °· Your have stool more often or less often than your health care provider tells you to expect. °· You are not making much urine. This may be a sign of dehydration. °Get help right away if: °· Your abdominal pain does not go away or it becomes severe. °· You keep vomiting. °· Your stool is not draining through the stoma. °· You have chest pain or an irregular heartbeat. °This information is not intended to replace advice given to you by your health care provider. Make sure you discuss any questions you have with your health care provider. °Document Released: 12/21/2010 Document Revised: 12/09/2015 Document Reviewed: 04/13/2015 °Elsevier Interactive Patient Education © 2018 [employer] ° ° °

## 2017-12-17 NOTE — Care Management Important Message (Signed)
Important Message  Patient Details  Name: [name] MRN: [id number] Date of Birth: 20-May-1946   Medicare Important Message Given:  Yes    Quanisha Drewry 12/17/2017, 11:59 AM

## 2017-12-17 NOTE — Care Management Note (Signed)
Case Management Note  Patient Details  Name: [name] MRN: [id number] Date of Birth: 1946/02/17  Subjective/Objective:                    Action/Plan:   Expected Discharge Date:                  Expected Discharge Plan:  [place]  In-House Referral:     Discharge planning Services  CM Consult  Post Acute Care Choice:  Home Health Choice offered to:  Patient  DME Arranged:  N/A DME Agency:  NA  HH Arranged:  RN [place] Agency:  [facility]  Status of Service:  Completed, signed off  If discussed at [place] of Stay Meetings, dates discussed:    Additional Comments:  Marilu Favre, RN 12/17/2017, 11:21 AM

## 2017-12-17 NOTE — Consult Note (Signed)
[place] Nurse ostomy follow up Stoma type/location: LMQ colostomy Stomal assessment/size: less edematous today. Decreased to 1 1/2 "  Peristomal assessment: intact midline abdominal staple line Treatment options for stomal/peristomal skin: barrier ring Output soft brown stool Ostomy pouching: 2pc. 2 3/4" flat pouch Education provided: Spouse at bedside. Emptied existing pouch Discussed showering, activity level, diet and emptying when 1/3 full.   Wife able to measure and cut new sized opening.  Applied pouch and barrier with minimal coaching.  Rolled closure.  Need to order additional supplies, then ready for discharge.  Enrolled patient in [facility] Discharge program: Yes WOc team will follow.  Domenic Moras RN BSN [place] Pager [phone]

## 2017-12-18 ENCOUNTER — Telehealth: Payer: Self-pay | Admitting: Internal Medicine

## 2017-12-18 DIAGNOSIS — I2510 Atherosclerotic heart disease of native coronary artery without angina pectoris: Secondary | ICD-10-CM | POA: Diagnosis not present

## 2017-12-18 DIAGNOSIS — E1140 Type 2 diabetes mellitus with diabetic neuropathy, unspecified: Secondary | ICD-10-CM | POA: Diagnosis not present

## 2017-12-18 DIAGNOSIS — K219 Gastro-esophageal reflux disease without esophagitis: Secondary | ICD-10-CM | POA: Diagnosis not present

## 2017-12-18 DIAGNOSIS — N183 Chronic kidney disease, stage 3 (moderate): Secondary | ICD-10-CM | POA: Diagnosis not present

## 2017-12-18 DIAGNOSIS — Z87891 Personal history of nicotine dependence: Secondary | ICD-10-CM | POA: Diagnosis not present

## 2017-12-18 DIAGNOSIS — Z433 Encounter for attention to colostomy: Secondary | ICD-10-CM | POA: Diagnosis not present

## 2017-12-18 DIAGNOSIS — I129 Hypertensive chronic kidney disease with stage 1 through stage 4 chronic kidney disease, or unspecified chronic kidney disease: Secondary | ICD-10-CM | POA: Diagnosis not present

## 2017-12-18 DIAGNOSIS — E1151 Type 2 diabetes mellitus with diabetic peripheral angiopathy without gangrene: Secondary | ICD-10-CM | POA: Diagnosis not present

## 2017-12-18 DIAGNOSIS — E785 Hyperlipidemia, unspecified: Secondary | ICD-10-CM | POA: Diagnosis not present

## 2017-12-18 DIAGNOSIS — Z7982 Long term (current) use of aspirin: Secondary | ICD-10-CM | POA: Diagnosis not present

## 2017-12-18 DIAGNOSIS — Z48815 Encounter for surgical aftercare following surgery on the digestive system: Secondary | ICD-10-CM | POA: Diagnosis not present

## 2017-12-18 DIAGNOSIS — E1122 Type 2 diabetes mellitus with diabetic chronic kidney disease: Secondary | ICD-10-CM | POA: Diagnosis not present

## 2017-12-18 DIAGNOSIS — Z794 Long term (current) use of insulin: Secondary | ICD-10-CM | POA: Diagnosis not present

## 2017-12-18 NOTE — Telephone Encounter (Signed)
Pt's daughter, Jerene Dilling, called to say that patient was released from the hospital and was under the impression that they would have the path results by now. She said that we had referred him to [facility] and did the procedure last Thursday by Dr Harrington Challenger. I told her that we should be getting a copy of results if we referred him there, but the results normally take 7-10 business days. She asked for [place] to give the results to patient's wife, [name], and not the patient. [name] is on the release to speak with regarding patient's medical care. Her number is [phone]

## 2017-12-18 NOTE — Telephone Encounter (Signed)
Noted. Tried calling spouse, no VM is set up. Will call again.

## 2017-12-18 NOTE — Telephone Encounter (Signed)
LSL, pts daughter was inquiring about results.

## 2017-12-18 NOTE — Telephone Encounter (Signed)
Pts spouse was notified and will contact the surgeons office.

## 2017-12-18 NOTE — Telephone Encounter (Signed)
Proper channels is for surgeon to discuss pathology from surgery with family although I don't see results back yet (Has only been approximately 3 business days so far). I would suggest they contact Dr. Kellie Moor office and inquire.

## 2017-12-19 ENCOUNTER — Encounter: Payer: Self-pay | Admitting: Internal Medicine

## 2017-12-19 NOTE — Telephone Encounter (Signed)
Reviewed pathology, benign disease. Patient had perforated diverticulitis with abscess.   Let's make arrangements for patient to return to see Dr. Gala Romney in 3 months. Can discuss at that time if TCS is warranted. Last one over 5 years ago, suboptimal prep.

## 2017-12-19 NOTE — Telephone Encounter (Signed)
Pt's spouse notified and apt is 03/26/18.

## 2017-12-19 NOTE — Telephone Encounter (Signed)
SCHEDULED PATIENT WITH A RMR FOLLOW UP

## 2017-12-20 DIAGNOSIS — E785 Hyperlipidemia, unspecified: Secondary | ICD-10-CM | POA: Diagnosis not present

## 2017-12-20 DIAGNOSIS — Z7982 Long term (current) use of aspirin: Secondary | ICD-10-CM | POA: Diagnosis not present

## 2017-12-20 DIAGNOSIS — N183 Chronic kidney disease, stage 3 (moderate): Secondary | ICD-10-CM | POA: Diagnosis not present

## 2017-12-20 DIAGNOSIS — E1140 Type 2 diabetes mellitus with diabetic neuropathy, unspecified: Secondary | ICD-10-CM | POA: Diagnosis not present

## 2017-12-20 DIAGNOSIS — I2510 Atherosclerotic heart disease of native coronary artery without angina pectoris: Secondary | ICD-10-CM | POA: Diagnosis not present

## 2017-12-20 DIAGNOSIS — I129 Hypertensive chronic kidney disease with stage 1 through stage 4 chronic kidney disease, or unspecified chronic kidney disease: Secondary | ICD-10-CM | POA: Diagnosis not present

## 2017-12-20 DIAGNOSIS — Z433 Encounter for attention to colostomy: Secondary | ICD-10-CM | POA: Diagnosis not present

## 2017-12-20 DIAGNOSIS — Z48815 Encounter for surgical aftercare following surgery on the digestive system: Secondary | ICD-10-CM | POA: Diagnosis not present

## 2017-12-20 DIAGNOSIS — K219 Gastro-esophageal reflux disease without esophagitis: Secondary | ICD-10-CM | POA: Diagnosis not present

## 2017-12-20 DIAGNOSIS — E1151 Type 2 diabetes mellitus with diabetic peripheral angiopathy without gangrene: Secondary | ICD-10-CM | POA: Diagnosis not present

## 2017-12-20 DIAGNOSIS — Z87891 Personal history of nicotine dependence: Secondary | ICD-10-CM | POA: Diagnosis not present

## 2017-12-20 DIAGNOSIS — Z794 Long term (current) use of insulin: Secondary | ICD-10-CM | POA: Diagnosis not present

## 2017-12-20 DIAGNOSIS — E1122 Type 2 diabetes mellitus with diabetic chronic kidney disease: Secondary | ICD-10-CM | POA: Diagnosis not present

## 2017-12-21 DIAGNOSIS — Z933 Colostomy status: Secondary | ICD-10-CM | POA: Diagnosis not present

## 2017-12-24 DIAGNOSIS — Z794 Long term (current) use of insulin: Secondary | ICD-10-CM | POA: Diagnosis not present

## 2017-12-24 DIAGNOSIS — Z87891 Personal history of nicotine dependence: Secondary | ICD-10-CM | POA: Diagnosis not present

## 2017-12-24 DIAGNOSIS — E785 Hyperlipidemia, unspecified: Secondary | ICD-10-CM | POA: Diagnosis not present

## 2017-12-24 DIAGNOSIS — I2510 Atherosclerotic heart disease of native coronary artery without angina pectoris: Secondary | ICD-10-CM | POA: Diagnosis not present

## 2017-12-24 DIAGNOSIS — E1140 Type 2 diabetes mellitus with diabetic neuropathy, unspecified: Secondary | ICD-10-CM | POA: Diagnosis not present

## 2017-12-24 DIAGNOSIS — E1151 Type 2 diabetes mellitus with diabetic peripheral angiopathy without gangrene: Secondary | ICD-10-CM | POA: Diagnosis not present

## 2017-12-24 DIAGNOSIS — N183 Chronic kidney disease, stage 3 (moderate): Secondary | ICD-10-CM | POA: Diagnosis not present

## 2017-12-24 DIAGNOSIS — K219 Gastro-esophageal reflux disease without esophagitis: Secondary | ICD-10-CM | POA: Diagnosis not present

## 2017-12-24 DIAGNOSIS — I129 Hypertensive chronic kidney disease with stage 1 through stage 4 chronic kidney disease, or unspecified chronic kidney disease: Secondary | ICD-10-CM | POA: Diagnosis not present

## 2017-12-24 DIAGNOSIS — Z433 Encounter for attention to colostomy: Secondary | ICD-10-CM | POA: Diagnosis not present

## 2017-12-24 DIAGNOSIS — Z7982 Long term (current) use of aspirin: Secondary | ICD-10-CM | POA: Diagnosis not present

## 2017-12-24 DIAGNOSIS — E1122 Type 2 diabetes mellitus with diabetic chronic kidney disease: Secondary | ICD-10-CM | POA: Diagnosis not present

## 2017-12-24 DIAGNOSIS — Z48815 Encounter for surgical aftercare following surgery on the digestive system: Secondary | ICD-10-CM | POA: Diagnosis not present

## 2017-12-26 ENCOUNTER — Ambulatory Visit: Payer: Medicare Other | Admitting: Nurse Practitioner

## 2017-12-26 DIAGNOSIS — Z7982 Long term (current) use of aspirin: Secondary | ICD-10-CM | POA: Diagnosis not present

## 2017-12-26 DIAGNOSIS — Z433 Encounter for attention to colostomy: Secondary | ICD-10-CM | POA: Diagnosis not present

## 2017-12-26 DIAGNOSIS — K219 Gastro-esophageal reflux disease without esophagitis: Secondary | ICD-10-CM | POA: Diagnosis not present

## 2017-12-26 DIAGNOSIS — E1122 Type 2 diabetes mellitus with diabetic chronic kidney disease: Secondary | ICD-10-CM | POA: Diagnosis not present

## 2017-12-26 DIAGNOSIS — N183 Chronic kidney disease, stage 3 (moderate): Secondary | ICD-10-CM | POA: Diagnosis not present

## 2017-12-26 DIAGNOSIS — Z794 Long term (current) use of insulin: Secondary | ICD-10-CM | POA: Diagnosis not present

## 2017-12-26 DIAGNOSIS — I2510 Atherosclerotic heart disease of native coronary artery without angina pectoris: Secondary | ICD-10-CM | POA: Diagnosis not present

## 2017-12-26 DIAGNOSIS — I129 Hypertensive chronic kidney disease with stage 1 through stage 4 chronic kidney disease, or unspecified chronic kidney disease: Secondary | ICD-10-CM | POA: Diagnosis not present

## 2017-12-26 DIAGNOSIS — Z87891 Personal history of nicotine dependence: Secondary | ICD-10-CM | POA: Diagnosis not present

## 2017-12-26 DIAGNOSIS — E785 Hyperlipidemia, unspecified: Secondary | ICD-10-CM | POA: Diagnosis not present

## 2017-12-26 DIAGNOSIS — E1140 Type 2 diabetes mellitus with diabetic neuropathy, unspecified: Secondary | ICD-10-CM | POA: Diagnosis not present

## 2017-12-26 DIAGNOSIS — Z48815 Encounter for surgical aftercare following surgery on the digestive system: Secondary | ICD-10-CM | POA: Diagnosis not present

## 2017-12-26 DIAGNOSIS — E1151 Type 2 diabetes mellitus with diabetic peripheral angiopathy without gangrene: Secondary | ICD-10-CM | POA: Diagnosis not present

## 2017-12-31 ENCOUNTER — Encounter: Payer: Self-pay | Admitting: Internal Medicine

## 2018-01-02 DIAGNOSIS — E1151 Type 2 diabetes mellitus with diabetic peripheral angiopathy without gangrene: Secondary | ICD-10-CM | POA: Diagnosis not present

## 2018-01-02 DIAGNOSIS — I2510 Atherosclerotic heart disease of native coronary artery without angina pectoris: Secondary | ICD-10-CM | POA: Diagnosis not present

## 2018-01-02 DIAGNOSIS — Z433 Encounter for attention to colostomy: Secondary | ICD-10-CM | POA: Diagnosis not present

## 2018-01-02 DIAGNOSIS — Z7982 Long term (current) use of aspirin: Secondary | ICD-10-CM | POA: Diagnosis not present

## 2018-01-02 DIAGNOSIS — I129 Hypertensive chronic kidney disease with stage 1 through stage 4 chronic kidney disease, or unspecified chronic kidney disease: Secondary | ICD-10-CM | POA: Diagnosis not present

## 2018-01-02 DIAGNOSIS — Z794 Long term (current) use of insulin: Secondary | ICD-10-CM | POA: Diagnosis not present

## 2018-01-02 DIAGNOSIS — Z87891 Personal history of nicotine dependence: Secondary | ICD-10-CM | POA: Diagnosis not present

## 2018-01-02 DIAGNOSIS — K219 Gastro-esophageal reflux disease without esophagitis: Secondary | ICD-10-CM | POA: Diagnosis not present

## 2018-01-02 DIAGNOSIS — E785 Hyperlipidemia, unspecified: Secondary | ICD-10-CM | POA: Diagnosis not present

## 2018-01-02 DIAGNOSIS — E1122 Type 2 diabetes mellitus with diabetic chronic kidney disease: Secondary | ICD-10-CM | POA: Diagnosis not present

## 2018-01-02 DIAGNOSIS — N183 Chronic kidney disease, stage 3 (moderate): Secondary | ICD-10-CM | POA: Diagnosis not present

## 2018-01-02 DIAGNOSIS — E1140 Type 2 diabetes mellitus with diabetic neuropathy, unspecified: Secondary | ICD-10-CM | POA: Diagnosis not present

## 2018-01-02 DIAGNOSIS — Z48815 Encounter for surgical aftercare following surgery on the digestive system: Secondary | ICD-10-CM | POA: Diagnosis not present

## 2018-01-03 DIAGNOSIS — Z933 Colostomy status: Secondary | ICD-10-CM | POA: Diagnosis not present

## 2018-01-04 IMAGING — CT CT MAXILLOFACIAL W/O CM
3 series · 16 of 47 positions shown, 19 images · non-contrast
Comparison: CT HEAD May 31, 2016 at 5318 hours

CLINICAL DATA: Fever and chills after going to dentist. History of
hypertension, hyperlipidemia and diabetes.

EXAM:
CT MAXILLOFACIAL WITHOUT CONTRAST
TECHNIQUE: Multidetector CT imaging of the maxillofacial structures was
performed. Multiplanar CT image reconstructions were also generated.

[Series 2: max soft · axial · 0.38mm/px · z∈[-88,+74]mm · 10 of 95 slices shown, 13 images]
[im 7/95  brain]
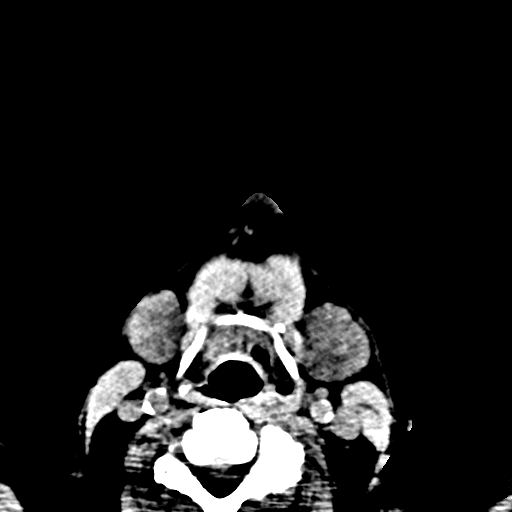
[im 7/95  bone]
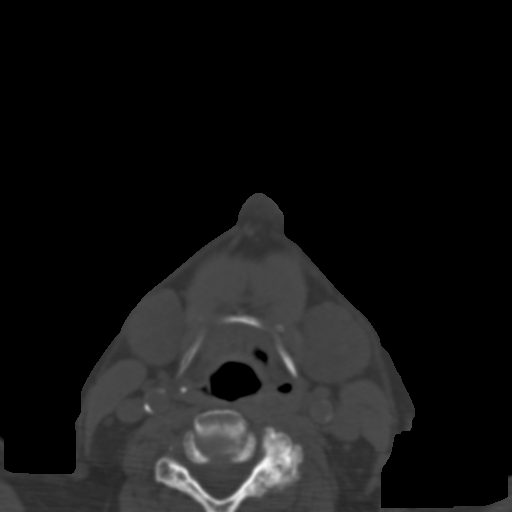
[im 17/95  bone]
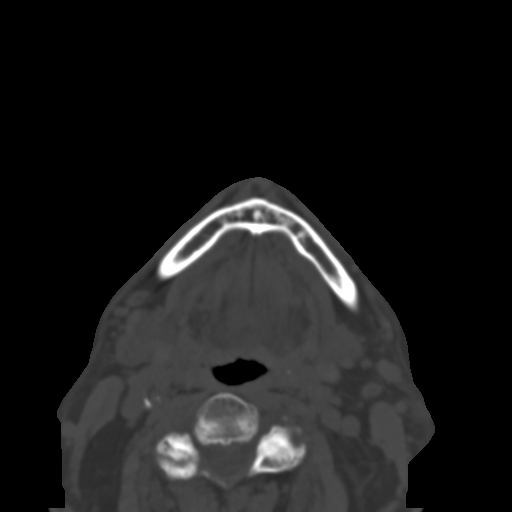
[im 26/95  bone]
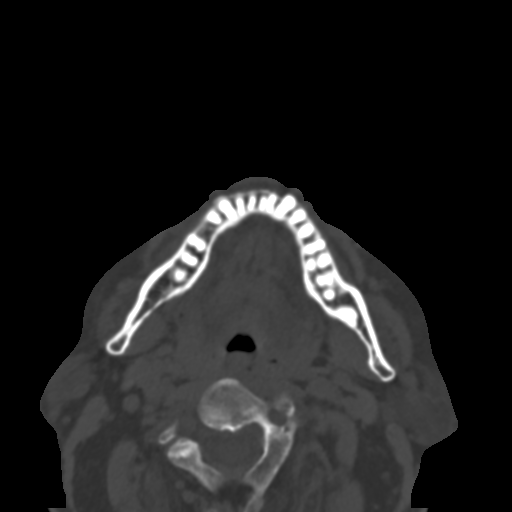
[im 33/95  bone]
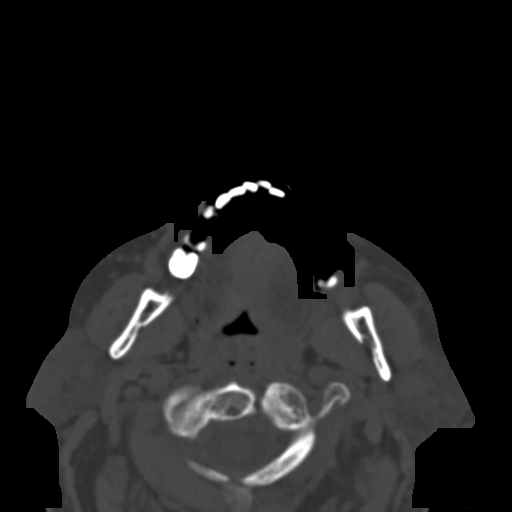
[im 43/95  brain]
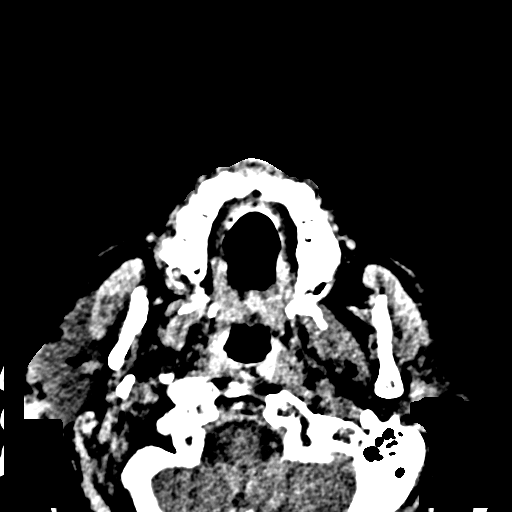
[im 43/95  bone]
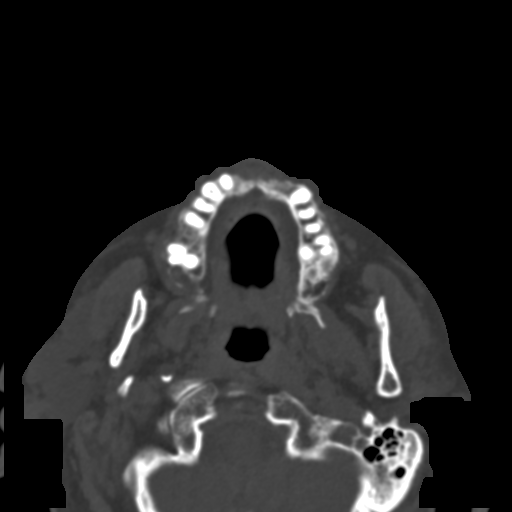
[im 52/95  bone]
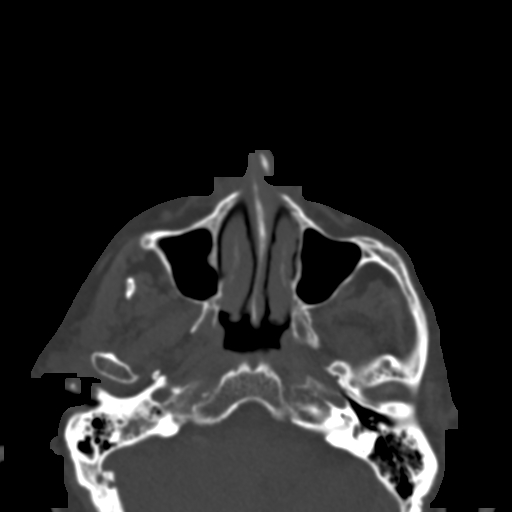
[im 62/95  bone]
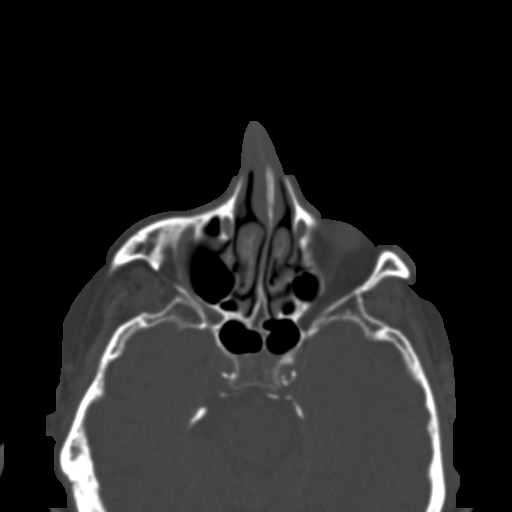
[im 72/95  bone]
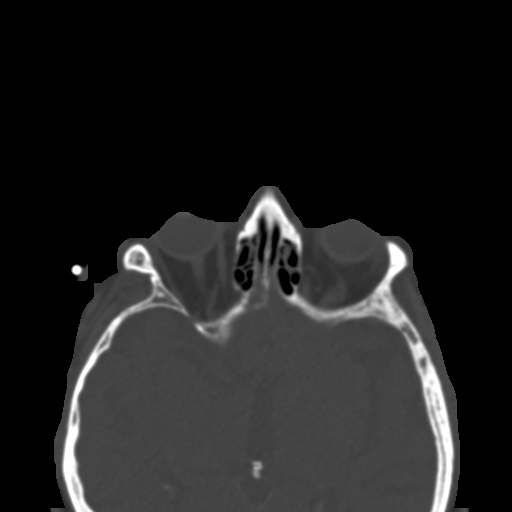
[im 78/95  brain]
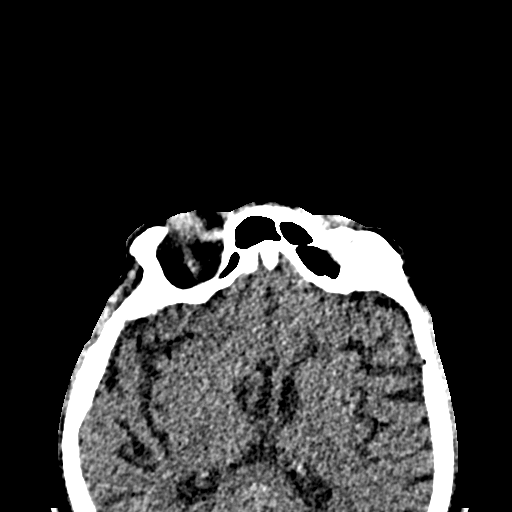
[im 78/95  bone]
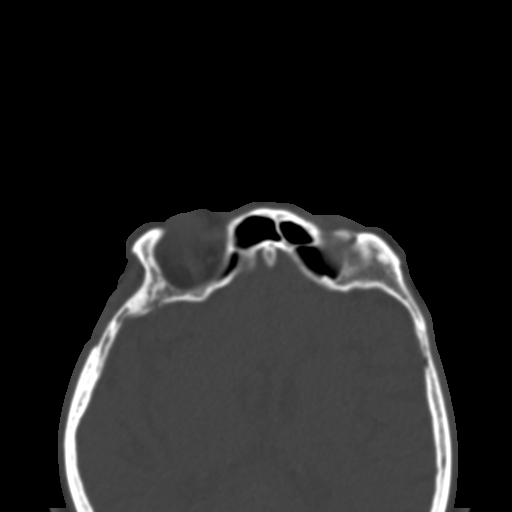
[im 88/95  bone]
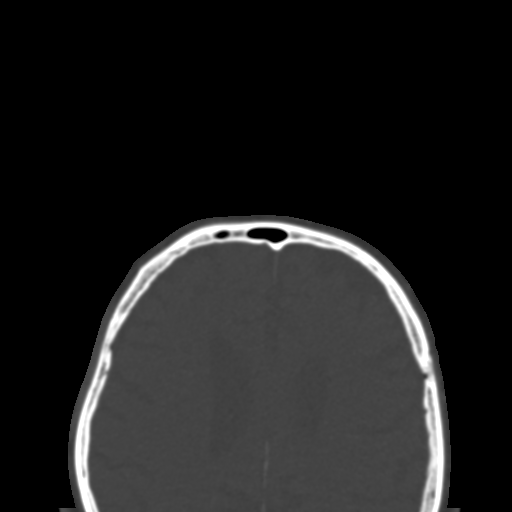

[Series 6: coronal soft · coronal · 0.40mm/px · 3 of 86 slices shown]
[im 29/86  bone]
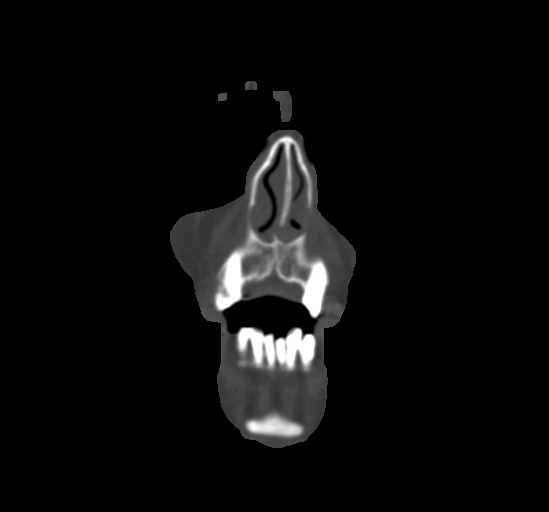
[im 38/86  bone]
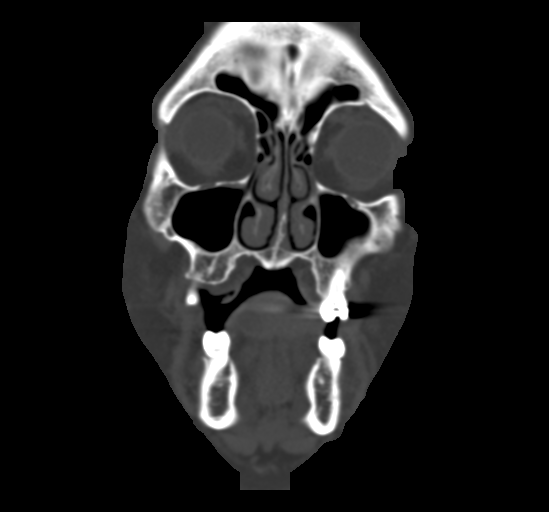
[im 48/86  bone]
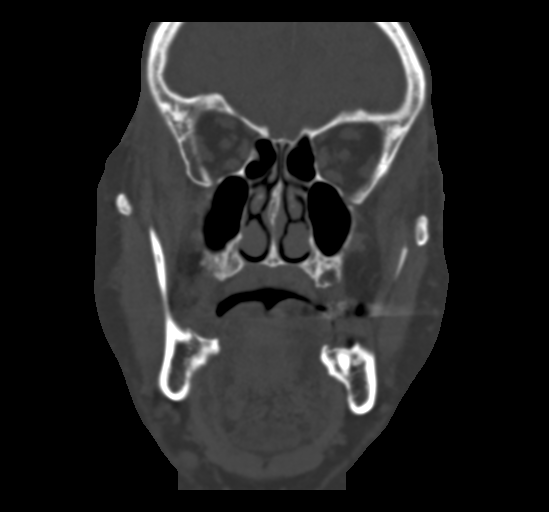

[Series 7: sagittal soft · sagittal · 0.32mm/px · 3 of 108 slices shown]
[im 36/108  bone]
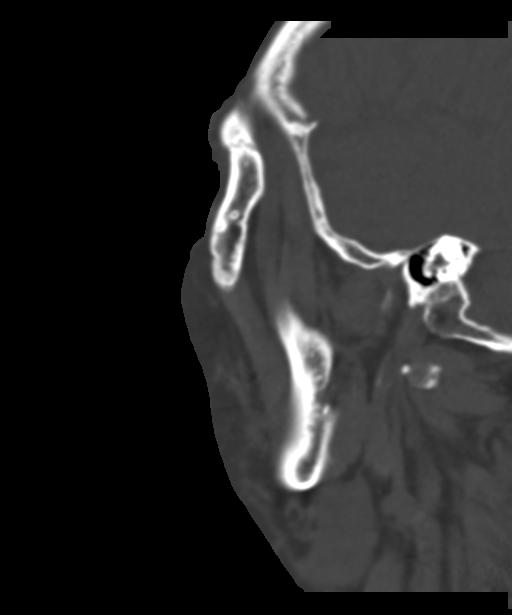
[im 54/108  bone]
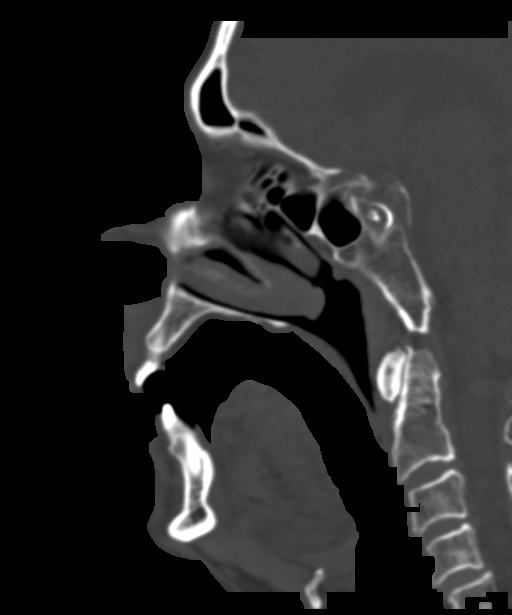
[im 72/108  bone]
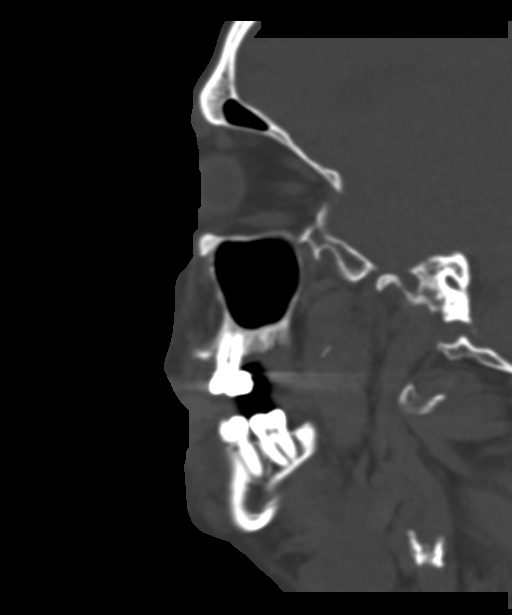

[16 of 47 positions shown; findings below may reference images not displayed]

FINDINGS: OSSEOUS: The mandible is intact, the condyles are located. No acute
facial fracture. No destructive bony lesions. No CT findings of
dental pathology. Multiple absent teeth. Unerupted RIGHT maxillary
and LEFT mandible molars.

ORBITS: Ocular globes and orbital contents are normal.

SINUSES: Trace paranasal sinus mucosal thickening without air-fluid
levels. Nasal septum is midline. Mastoid aircells are well aerated.

SOFT TISSUES: No significant soft tissue swelling. No subcutaneous
gas or radiopaque foreign bodies. Moderate calcific atherosclerosis
carotid bifurcations. Subcentimeter intraparotid lymph nodes.

LIMITED INTRACRANIAL: Nonacute. Please see CT of chest from same
day, reported separately for dedicated findings.
IMPRESSION: No acute dental pathology or acute process in the face by
noncontrast CT.

## 2018-01-08 DIAGNOSIS — Z7982 Long term (current) use of aspirin: Secondary | ICD-10-CM | POA: Diagnosis not present

## 2018-01-08 DIAGNOSIS — E785 Hyperlipidemia, unspecified: Secondary | ICD-10-CM | POA: Diagnosis not present

## 2018-01-08 DIAGNOSIS — E1122 Type 2 diabetes mellitus with diabetic chronic kidney disease: Secondary | ICD-10-CM | POA: Diagnosis not present

## 2018-01-08 DIAGNOSIS — I2510 Atherosclerotic heart disease of native coronary artery without angina pectoris: Secondary | ICD-10-CM | POA: Diagnosis not present

## 2018-01-08 DIAGNOSIS — I129 Hypertensive chronic kidney disease with stage 1 through stage 4 chronic kidney disease, or unspecified chronic kidney disease: Secondary | ICD-10-CM | POA: Diagnosis not present

## 2018-01-08 DIAGNOSIS — E1140 Type 2 diabetes mellitus with diabetic neuropathy, unspecified: Secondary | ICD-10-CM | POA: Diagnosis not present

## 2018-01-08 DIAGNOSIS — E1151 Type 2 diabetes mellitus with diabetic peripheral angiopathy without gangrene: Secondary | ICD-10-CM | POA: Diagnosis not present

## 2018-01-08 DIAGNOSIS — Z87891 Personal history of nicotine dependence: Secondary | ICD-10-CM | POA: Diagnosis not present

## 2018-01-08 DIAGNOSIS — Z794 Long term (current) use of insulin: Secondary | ICD-10-CM | POA: Diagnosis not present

## 2018-01-08 DIAGNOSIS — Z433 Encounter for attention to colostomy: Secondary | ICD-10-CM | POA: Diagnosis not present

## 2018-01-08 DIAGNOSIS — K219 Gastro-esophageal reflux disease without esophagitis: Secondary | ICD-10-CM | POA: Diagnosis not present

## 2018-01-08 DIAGNOSIS — Z48815 Encounter for surgical aftercare following surgery on the digestive system: Secondary | ICD-10-CM | POA: Diagnosis not present

## 2018-01-08 DIAGNOSIS — N183 Chronic kidney disease, stage 3 (moderate): Secondary | ICD-10-CM | POA: Diagnosis not present

## 2018-01-11 DIAGNOSIS — Z933 Colostomy status: Secondary | ICD-10-CM | POA: Diagnosis not present

## 2018-01-17 DIAGNOSIS — I129 Hypertensive chronic kidney disease with stage 1 through stage 4 chronic kidney disease, or unspecified chronic kidney disease: Secondary | ICD-10-CM | POA: Diagnosis not present

## 2018-01-17 DIAGNOSIS — Z7982 Long term (current) use of aspirin: Secondary | ICD-10-CM | POA: Diagnosis not present

## 2018-01-17 DIAGNOSIS — Z48815 Encounter for surgical aftercare following surgery on the digestive system: Secondary | ICD-10-CM | POA: Diagnosis not present

## 2018-01-17 DIAGNOSIS — E1122 Type 2 diabetes mellitus with diabetic chronic kidney disease: Secondary | ICD-10-CM | POA: Diagnosis not present

## 2018-01-17 DIAGNOSIS — E1151 Type 2 diabetes mellitus with diabetic peripheral angiopathy without gangrene: Secondary | ICD-10-CM | POA: Diagnosis not present

## 2018-01-17 DIAGNOSIS — E1140 Type 2 diabetes mellitus with diabetic neuropathy, unspecified: Secondary | ICD-10-CM | POA: Diagnosis not present

## 2018-01-17 DIAGNOSIS — N183 Chronic kidney disease, stage 3 (moderate): Secondary | ICD-10-CM | POA: Diagnosis not present

## 2018-01-17 DIAGNOSIS — I2510 Atherosclerotic heart disease of native coronary artery without angina pectoris: Secondary | ICD-10-CM | POA: Diagnosis not present

## 2018-01-17 DIAGNOSIS — Z87891 Personal history of nicotine dependence: Secondary | ICD-10-CM | POA: Diagnosis not present

## 2018-01-17 DIAGNOSIS — Z794 Long term (current) use of insulin: Secondary | ICD-10-CM | POA: Diagnosis not present

## 2018-01-17 DIAGNOSIS — Z433 Encounter for attention to colostomy: Secondary | ICD-10-CM | POA: Diagnosis not present

## 2018-01-17 DIAGNOSIS — E785 Hyperlipidemia, unspecified: Secondary | ICD-10-CM | POA: Diagnosis not present

## 2018-01-17 DIAGNOSIS — K219 Gastro-esophageal reflux disease without esophagitis: Secondary | ICD-10-CM | POA: Diagnosis not present

## 2018-02-04 ENCOUNTER — Telehealth: Payer: Self-pay

## 2018-02-04 NOTE — Telephone Encounter (Signed)
Per RMR- pt needs a tcs within the next 4 weeks or so through is colostomy prior to his colostomy reversal in August with Dr.Tsuei.   Pt is scheduled for an OV with LSL on 02/06/18.

## 2018-02-06 ENCOUNTER — Other Ambulatory Visit: Payer: Self-pay

## 2018-02-06 ENCOUNTER — Ambulatory Visit (INDEPENDENT_AMBULATORY_CARE_PROVIDER_SITE_OTHER): Payer: Medicare Other | Admitting: Gastroenterology

## 2018-02-06 ENCOUNTER — Encounter: Payer: Self-pay | Admitting: Gastroenterology

## 2018-02-06 VITALS — BP 123/72 | HR 82 | Temp 97.0°F | Ht 67.0 in | Wt 149.0 lb

## 2018-02-06 DIAGNOSIS — Z933 Colostomy status: Secondary | ICD-10-CM | POA: Diagnosis not present

## 2018-02-06 DIAGNOSIS — K5720 Diverticulitis of large intestine with perforation and abscess without bleeding: Secondary | ICD-10-CM

## 2018-02-06 NOTE — Patient Instructions (Signed)
1. Colonoscopy as scheduled. See separate instructions.  

## 2018-02-06 NOTE — H&P (View-Only) (Signed)
Primary Care Physician:  [name], MD  Primary Gastroenterologist:  Garfield Cornea, MD   Chief Complaint  Patient presents with  . other    colonoscopy/ pt has lots of questions    HPI:  [name] is a 72 y.o. male here to schedule colonoscopy prior to colostomy takedown.  Patient underwent sigmoid colectomy, descending colostomy Dec 13, 2017 for chronic abnormality seen in the sigmoid colon area on multiple CT scans this year.  Fortunately pathology was benign, he had diverticulitis with perforation and pericolonic abscess.  No evidence of malignancy.  Initially it was suspected that he had a diverticular abscess and was treated several times with antibiotics without improvement in symptoms. Interventional radiology was consulted for possible drainage of abscess but when they looked at the CT scan it was felt that it was too risky to drain especially as this abnormality could have been a large diverticulum versus abscess communicating with the colon. We attempted a colonoscopy in March 2019 but bowel was in adequate and the exam was aborted.    I met him in April, he had a palpable mass in the left lower quadrant on exam therefore CT was repeated.  He had a persistent 7 cm size region of the proximal sigmoid colon with surrounding inflammatory changes which could represent a giant diverticulum containing stool or chronic contained walled off perforation/abscess with communication with sigmoid lumen.  Underlying malignancy not excluded.  We scheduled him for outpatient evaluation at [facility].  Prior to this appointment, patient became ill required hospitalization.  Ultimately underwent surgery as outlined above.  Patient presents today without any complaints.  He states he feels the best he has felt in a long time.  Denies heartburn, abdominal pain, vomiting.  No melena or rectal bleeding.  He has had a couple episodes of diarrhea and leakage with his colostomy bag.  Appetite has improved.  His  weight has stabilized slowly climbing.   Current Outpatient Medications  Medication Sig Dispense Refill  . albuterol (PROVENTIL HFA;VENTOLIN HFA) 108 (90 Base) MCG/ACT inhaler Inhale 2 puffs into the lungs every 6 (six) hours as needed for wheezing or shortness of breath. 1 Inhaler 2  . aspirin 81 MG tablet Take 81 mg by mouth daily.    . Cholecalciferol (VITAMIN D) 2000 UNITS tablet Take 2,000 Units by mouth daily.    . cilostazol (PLETAL) 100 MG tablet Take 100 mg by mouth 2 (two) times daily.    . ferrous sulfate 325 (65 FE) MG tablet Take 325 mg by mouth daily.     [name] gabapentin (NEURONTIN) 300 MG capsule Take 300-600 mg by mouth See admin instructions. 354m in the morning and 6074mat bedtime    . guaiFENesin (MUCINEX) 600 MG 12 hr tablet Take 1 tablet (600 mg total) by mouth 2 (two) times daily. (Patient taking differently: Take 600 mg by mouth 2 (two) times daily as needed. ) 60 tablet 2  . ibuprofen (ADVIL,MOTRIN) 200 MG tablet Take 200 mg by mouth every 6 (six) hours as needed for moderate pain.    . Marland Kitchennsulin glargine (LANTUS) 100 UNIT/ML injection Inject 0.2 mLs (20 Units total) into the skin at bedtime. (Patient taking differently: Inject 25 Units into the skin at bedtime. ) 10 mL 11  . metoprolol tartrate (LOPRESSOR) 25 MG tablet Take 25 mg by mouth 2 (two) times daily.    . pantoprazole (PROTONIX) 40 MG tablet Take 1 tablet (40 mg total) by mouth daily. 5 tablet 0  .  simvastatin (ZOCOR) 80 MG tablet Take 40 mg by mouth at bedtime.     . vitamin B-12 (CYANOCOBALAMIN) 1000 MCG tablet Take 1,000 mcg by mouth daily.     No current facility-administered medications for this visit.     Allergies as of 02/06/2018  . (No Known Allergies)    Past Medical History:  Diagnosis Date  . CAD S/P percutaneous coronary angioplasty 1998   PCI TO CX  . CKD (chronic kidney disease) stage 3, GFR 30-59 ml/min (HCC)   . Diabetes mellitus without complication ([place])   . Diverticulitis 08/16/2017    hospitalized with diverticulitis/sepsis  . GERD (gastroesophageal reflux disease)   . Headache   . 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 Hypercholesteremia   . Hypertension   . Neuropathy   . Peripheral vascular disease ([place])    s/p R-L FEM-FEM BYPASS  . Pneumonia   . Tachycardia    after CABG, pt on medicine for this    Past Surgical History:  Procedure Laterality Date  . ABDOMINAL AORTOGRAM Bilateral 09/19/2016   Procedure: iliac;  Surgeon: Serafina Mitchell, MD;  Location: [facility];  Service: Cardiovascular;  Laterality: Bilateral;  . CARDIAC CATHETERIZATION  2003   with stent  . CARDIAC CATHETERIZATION N/A 10/11/2015   Procedure: Left Heart Cath and Coronary Angiography;  Surgeon: Peter M Martinique, MD;  Location: [facility];  Service: Cardiovascular;  Laterality: N/A;  . COLON RESECTION N/A 12/13/2017   Procedure: EXPLORATORY LAPAROTOMY, SIGMOID COLECTOMY WITH COLOSTOMY;  Surgeon: Donnie Mesa, MD;  Location: [facility];  Service: General;  Laterality: N/A;  . COLONOSCOPY N/A 09/22/2013   Procedure: COLONOSCOPY;  Surgeon: Daneil Dolin, MD;  [facility];  Service: Endoscopy;  Laterality: N/A;  9:30 AM  . COLOSTOMY N/A 12/13/2017   Procedure: COLOSTOMY;  Surgeon: Donnie Mesa, MD;  Location: [facility];  Service: General;  Laterality: N/A;  . CORONARY ARTERY BYPASS GRAFT N/A 10/18/2015   Procedure: CORONARY ARTERY BYPASS GRAFTING (CABG) x  five, using left internal mammary artery and right leg greater saphenous vein harvested endoscopically;  Surgeon: [name], MD;  Location: [facility];  Service: Open Heart Surgery;  Laterality: N/A;  . ENDARTERECTOMY FEMORAL Right 08/09/2016   Procedure: ENDARTERECTOMY FEMORAL WITH VEIN PATCH ANGIOPLASTY;  Surgeon: Serafina Mitchell, MD;  [facility];  Service: Vascular;  Laterality: Right;  . ESOPHAGOGASTRODUODENOSCOPY N/A 10/24/2017   Dr. Gala Romney: hiatal hernia  . FEMORAL-FEMORAL BYPASS GRAFT Bilateral 08/09/2016   Procedure:  REVISION BYPASS GRAFT RIGHT FEMORAL-LEFT FEMORAL ARTERY;  Surgeon: Serafina Mitchell, MD;  Location: [facility];  Service: Vascular;  Laterality: Bilateral;  . FEMORAL-POPLITEAL BYPASS GRAFT    . PERIPHERAL VASCULAR CATHETERIZATION N/A 05/08/2016   Procedure: Lower Extremity Angiography;  Surgeon: Lorretta Harp, MD;  Location: [facility];  Service: Cardiovascular;  Laterality: N/A;  . PERIPHERAL VASCULAR INTERVENTION Right 09/19/2016   Procedure: Peripheral Vascular Intervention;  Surgeon: Serafina Mitchell, MD;  Location: [facility];  Service: Cardiovascular;  Laterality: Right;  ext iliac stent  . PROCTOSCOPY  10/24/2017   Procedure: PROCTOSCOPY;  Surgeon: Daneil Dolin, MD;  [facility];  Service: Endoscopy;;  . TEE WITHOUT CARDIOVERSION N/A 10/18/2015   Procedure: TRANSESOPHAGEAL ECHOCARDIOGRAM (TEE);  Surgeon: [name], MD;  Location: [facility];  Service: Open Heart Surgery;  Laterality: N/A;    Family History  Problem Relation Age of Onset  .  Heart attack Mother   . Stroke Mother   . Colon cancer Neg Hx     Social History   Socioeconomic History  . Marital status: Married    Spouse name: Not on file  . Number of children: Not on file  . Years of education: Not on file  . Highest education level: Not on file  Occupational History  . Not on file  Social Needs  . Financial resource strain: Not on file  . Food insecurity:    Worry: Not on file    Inability: Not on file  . Transportation needs:    Medical: Not on file    Non-medical: Not on file  Tobacco Use  . Smoking status: Former Smoker    Packs/day: 2.00    Years: 30.00    Pack years: 60.00    Types: Cigarettes    Last attempt to quit: 08/14/2000    Years since quitting: 17.4  . Smokeless tobacco: Never Used  Substance and Sexual Activity  . Alcohol use: No  . Drug use: No  . Sexual activity: Not on file  Lifestyle  . Physical activity:    Days per week: Not on file    Minutes per  session: Not on file  . Stress: Not on file  Relationships  . Social connections:    Talks on phone: Not on file    Gets together: Not on file    Attends religious service: Not on file    Active member of club or organization: Not on file    Attends meetings of clubs or organizations: Not on file    Relationship status: Not on file  . Intimate partner violence:    Fear of current or ex partner: Not on file    Emotionally abused: Not on file    Physically abused: Not on file    Forced sexual activity: Not on file  Other Topics Concern  . Not on file  Social History Narrative  . Not on file      ROS:  General: Negative for anorexia, weight loss, fever, chills, fatigue, weakness. Eyes: Negative for vision changes.  ENT: Negative for hoarseness, difficulty swallowing , nasal congestion. CV: Negative for chest pain, angina, palpitations, dyspnea on exertion, peripheral edema.  Respiratory: Negative for dyspnea at rest, dyspnea on exertion, cough, sputum, wheezing.  GI: See history of present illness. GU:  Negative for dysuria, hematuria, urinary incontinence, urinary frequency, nocturnal urination.  MS: Negative for joint pain, low back pain.  Derm: Negative for rash or itching.  Neuro: Negative for weakness, abnormal sensation, seizure, frequent headaches, memory loss, confusion.  Psych: Negative for anxiety, depression, suicidal ideation, hallucinations.  Endo: Negative for unusual weight change.  Heme: Negative for bruising or bleeding. Allergy: Negative for rash or hives.    Physical Examination:  BP 123/72   Pulse 82   Temp (!) 97 F (36.1 C) (Oral)   Ht 5' 7" (1.702 m)   Wt 149 lb (67.6 kg)   BMI 23.34 kg/m    General: Well-nourished, well-developed in no acute distress.  Head: Normocephalic, atraumatic.   Eyes: Conjunctiva pink, no icterus. Mouth: Oropharyngeal mucosa moist and pink , no lesions erythema or exudate. Neck: Supple without thyromegaly, masses, or  lymphadenopathy.  Lungs: Clear to auscultation bilaterally.  Heart: Regular rate and rhythm, no murmurs rubs or gallops.  Abdomen: Bowel sounds are normal, nontender, nondistended, no hepatosplenomegaly or masses, no abdominal bruits or    hernia , no rebound  or guarding.  Colostomy in the left abdomen with solid green stool present. Rectal: Not performed Extremities: No lower extremity edema. No clubbing or deformities.  Neuro: Alert and oriented x 4 , grossly normal neurologically.  Skin: Warm and dry, no rash or jaundice.   Psych: Alert and cooperative, normal mood and affect.  Labs: Lab Results  Component Value Date   CREATININE 0.97 12/16/2017   BUN 9 12/16/2017   NA 137 12/16/2017   K 4.1 12/16/2017   CL 103 12/16/2017   CO2 23 12/16/2017   Lab Results  Component Value Date   ALT 15 (L) 12/12/2017   AST 18 12/12/2017   ALKPHOS 86 12/12/2017   BILITOT 0.4 12/12/2017   Lab Results  Component Value Date   WBC 11.0 (H) 12/16/2017   HGB 11.7 (L) 12/16/2017   HCT 35.2 (L) 12/16/2017   MCV 87.1 12/16/2017   PLT 428 (H) 12/16/2017   No results found for: IRON, TIBC, FERRITIN  Imaging Studies: No results found.

## 2018-02-06 NOTE — Assessment & Plan Note (Signed)
72 year old gentleman presenting to schedule colonoscopy prior to colostomy takedown.  Last complete colonoscopy 4 years ago.  Plans to perform colonoscopy through colostomy, consider rectal exam at time of procedure as well.  I have discussed the risks, alternatives, benefits with regards to but not limited to the risk of reaction to medication, bleeding, infection, perforation and the patient is agreeable to proceed. Written consent to be obtained.

## 2018-02-06 NOTE — Progress Notes (Signed)
Primary Care Physician:  Cassada, Peggy, MD  Primary Gastroenterologist:  Michael Rourk, MD   Chief Complaint  Patient presents with  . other    colonoscopy/ pt has lots of questions    HPI:  [name] is a 72 y.o. male here to schedule colonoscopy prior to colostomy takedown.  Patient underwent sigmoid colectomy, descending colostomy Dec 13, 2017 for chronic abnormality seen in the sigmoid colon area on multiple CT scans this year.  Fortunately pathology was benign, he had diverticulitis with perforation and pericolonic abscess.  No evidence of malignancy.  Initially it was suspected that he had a diverticular abscess and was treated several times with antibiotics without improvement in symptoms. Interventional radiology was consulted for possible drainage of abscess but when they looked at the CT scan it was felt that it was too risky to drain especially as this abnormality could have been a large diverticulum versus abscess communicating with the colon. We attempted a colonoscopy in March 2019 but bowel was in adequate and the exam was aborted.    I met him in April, he had a palpable mass in the left lower quadrant on exam therefore CT was repeated.  He had a persistent 7 cm size region of the proximal sigmoid colon with surrounding inflammatory changes which could represent a giant diverticulum containing stool or chronic contained walled off perforation/abscess with communication with sigmoid lumen.  Underlying malignancy not excluded.  We scheduled him for outpatient evaluation at [facility].  Prior to this appointment, patient became ill required hospitalization.  Ultimately underwent surgery as outlined above.  Patient presents today without any complaints.  He states he feels the best he has felt in a long time.  Denies heartburn, abdominal pain, vomiting.  No melena or rectal bleeding.  He has had a couple episodes of diarrhea and leakage with his colostomy bag.  Appetite has improved.  His  weight has stabilized slowly climbing.   Current Outpatient Medications  Medication Sig Dispense Refill  . albuterol (PROVENTIL HFA;VENTOLIN HFA) 108 (90 Base) MCG/ACT inhaler Inhale 2 puffs into the lungs every 6 (six) hours as needed for wheezing or shortness of breath. 1 Inhaler 2  . aspirin 81 MG tablet Take 81 mg by mouth daily.    . Cholecalciferol (VITAMIN D) 2000 UNITS tablet Take 2,000 Units by mouth daily.    . cilostazol (PLETAL) 100 MG tablet Take 100 mg by mouth 2 (two) times daily.    . ferrous sulfate 325 (65 FE) MG tablet Take 325 mg by mouth daily.     . gabapentin (NEURONTIN) 300 MG capsule Take 300-600 mg by mouth See admin instructions. 300mg in the morning and 600mg at bedtime    . guaiFENesin (MUCINEX) 600 MG 12 hr tablet Take 1 tablet (600 mg total) by mouth 2 (two) times daily. (Patient taking differently: Take 600 mg by mouth 2 (two) times daily as needed. ) 60 tablet 2  . ibuprofen (ADVIL,MOTRIN) 200 MG tablet Take 200 mg by mouth every 6 (six) hours as needed for moderate pain.    . insulin glargine (LANTUS) 100 UNIT/ML injection Inject 0.2 mLs (20 Units total) into the skin at bedtime. (Patient taking differently: Inject 25 Units into the skin at bedtime. ) 10 mL 11  . metoprolol tartrate (LOPRESSOR) 25 MG tablet Take 25 mg by mouth 2 (two) times daily.    . pantoprazole (PROTONIX) 40 MG tablet Take 1 tablet (40 mg total) by mouth daily. 5 tablet 0  .   simvastatin (ZOCOR) 80 MG tablet Take 40 mg by mouth at bedtime.     . vitamin B-12 (CYANOCOBALAMIN) 1000 MCG tablet Take 1,000 mcg by mouth daily.     No current facility-administered medications for this visit.     Allergies as of 02/06/2018  . (No Known Allergies)    Past Medical History:  Diagnosis Date  . CAD S/P percutaneous coronary angioplasty 1998   PCI TO CX  . CKD (chronic kidney disease) stage 3, GFR 30-59 ml/min (HCC)   . Diabetes mellitus without complication (HCC)   . Diverticulitis 08/16/2017    hospitalized with diverticulitis/sepsis  . GERD (gastroesophageal reflux disease)   . Headache   . 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 Hypercholesteremia   . Hypertension   . Neuropathy   . Peripheral vascular disease ([facility]CC)    s/p R-L FEM-FEM BYPASS  . Pneumonia   . Tachycardia    after CABG, pt on medicine for this    Past Surgical History:  Procedure Laterality Date  . ABDOMINAL AORTOGRAM Bilateral 09/19/2016   Procedure: iliac;  Surgeon: Vance W Brabham, MD;  [facility];  Service: Cardiovascular;  Laterality: Bilateral;  . CARDIAC CATHETERIZATION  2003   with stent  . CARDIAC CATHETERIZATION N/A 10/11/2015   Procedure: Left Heart Cath and Coronary Angiography;  Surgeon: Peter M Jordan, MD;  [facility];  Service: Cardiovascular;  Laterality: N/A;  . COLON RESECTION N/A 12/13/2017   Procedure: EXPLORATORY LAPAROTOMY, SIGMOID COLECTOMY WITH COLOSTOMY;  Surgeon: Tsuei, Matthew, MD;  [facility];  Service: General;  Laterality: N/A;  . COLONOSCOPY N/A 09/22/2013   Procedure: COLONOSCOPY;  Surgeon: Robert M Rourk, MD;  [facility];  Service: Endoscopy;  Laterality: N/A;  9:30 AM  . COLOSTOMY N/A 12/13/2017   Procedure: COLOSTOMY;  Surgeon: Tsuei, Matthew, MD;  [facility];  Service: General;  Laterality: N/A;  . CORONARY ARTERY BYPASS GRAFT N/A 10/18/2015   Procedure: CORONARY ARTERY BYPASS GRAFTING (CABG) x  five, using left internal mammary artery and right leg greater saphenous vein harvested endoscopically;  Surgeon: Steven C Hendrickson, MD;  [facility];  Service: Open Heart Surgery;  Laterality: N/A;  . ENDARTERECTOMY FEMORAL Right 08/09/2016   Procedure: ENDARTERECTOMY FEMORAL WITH VEIN PATCH ANGIOPLASTY;  Surgeon: Vance W Brabham, MD;  [facility];  Service: Vascular;  Laterality: Right;  . ESOPHAGOGASTRODUODENOSCOPY N/A 10/24/2017   Dr. Rourk: hiatal hernia  . FEMORAL-FEMORAL BYPASS GRAFT Bilateral 08/09/2016   Procedure:  REVISION BYPASS GRAFT RIGHT FEMORAL-LEFT FEMORAL ARTERY;  Surgeon: Vance W Brabham, MD;  [facility];  Service: Vascular;  Laterality: Bilateral;  . FEMORAL-POPLITEAL BYPASS GRAFT    . PERIPHERAL VASCULAR CATHETERIZATION N/A 05/08/2016   Procedure: Lower Extremity Angiography;  Surgeon: Jonathan J Berry, MD;  [facility];  Service: Cardiovascular;  Laterality: N/A;  . PERIPHERAL VASCULAR INTERVENTION Right 09/19/2016   Procedure: Peripheral Vascular Intervention;  Surgeon: Vance W Brabham, MD;  [facility];  Service: Cardiovascular;  Laterality: Right;  ext iliac stent  . PROCTOSCOPY  10/24/2017   Procedure: PROCTOSCOPY;  Surgeon: Rourk, Robert M, MD;  [facility];  Service: Endoscopy;;  . TEE WITHOUT CARDIOVERSION N/A 10/18/2015   Procedure: TRANSESOPHAGEAL ECHOCARDIOGRAM (TEE);  Surgeon: Steven C Hendrickson, MD;  [facility];  Service: Open Heart Surgery;  Laterality: N/A;    Family History  Problem Relation Age of Onset  .   Heart attack Mother   . Stroke Mother   . Colon cancer Neg Hx     Social History   Socioeconomic History  . Marital status: Married    Spouse name: Not on file  . Number of children: Not on file  . Years of education: Not on file  . Highest education level: Not on file  Occupational History  . Not on file  Social Needs  . Financial resource strain: Not on file  . Food insecurity:    Worry: Not on file    Inability: Not on file  . Transportation needs:    Medical: Not on file    Non-medical: Not on file  Tobacco Use  . Smoking status: Former Smoker    Packs/day: 2.00    Years: 30.00    Pack years: 60.00    Types: Cigarettes    Last attempt to quit: 08/14/2000    Years since quitting: 17.4  . Smokeless tobacco: Never Used  Substance and Sexual Activity  . Alcohol use: No  . Drug use: No  . Sexual activity: Not on file  Lifestyle  . Physical activity:    Days per week: Not on file    Minutes per  session: Not on file  . Stress: Not on file  Relationships  . Social connections:    Talks on phone: Not on file    Gets together: Not on file    Attends religious service: Not on file    Active member of club or organization: Not on file    Attends meetings of clubs or organizations: Not on file    Relationship status: Not on file  . Intimate partner violence:    Fear of current or ex partner: Not on file    Emotionally abused: Not on file    Physically abused: Not on file    Forced sexual activity: Not on file  Other Topics Concern  . Not on file  Social History Narrative  . Not on file      ROS:  General: Negative for anorexia, weight loss, fever, chills, fatigue, weakness. Eyes: Negative for vision changes.  ENT: Negative for hoarseness, difficulty swallowing , nasal congestion. CV: Negative for chest pain, angina, palpitations, dyspnea on exertion, peripheral edema.  Respiratory: Negative for dyspnea at rest, dyspnea on exertion, cough, sputum, wheezing.  GI: See history of present illness. GU:  Negative for dysuria, hematuria, urinary incontinence, urinary frequency, nocturnal urination.  MS: Negative for joint pain, low back pain.  Derm: Negative for rash or itching.  Neuro: Negative for weakness, abnormal sensation, seizure, frequent headaches, memory loss, confusion.  Psych: Negative for anxiety, depression, suicidal ideation, hallucinations.  Endo: Negative for unusual weight change.  Heme: Negative for bruising or bleeding. Allergy: Negative for rash or hives.    Physical Examination:  BP 123/72   Pulse 82   Temp (!) 97 F (36.1 C) (Oral)   Ht 5' 7" (1.702 m)   Wt 149 lb (67.6 kg)   BMI 23.34 kg/m    General: Well-nourished, well-developed in no acute distress.  Head: Normocephalic, atraumatic.   Eyes: Conjunctiva pink, no icterus. Mouth: Oropharyngeal mucosa moist and pink , no lesions erythema or exudate. Neck: Supple without thyromegaly, masses, or  lymphadenopathy.  Lungs: Clear to auscultation bilaterally.  Heart: Regular rate and rhythm, no murmurs rubs or gallops.  Abdomen: Bowel sounds are normal, nontender, nondistended, no hepatosplenomegaly or masses, no abdominal bruits or    hernia , no rebound   or guarding.  Colostomy in the left abdomen with solid green stool present. Rectal: Not performed Extremities: No lower extremity edema. No clubbing or deformities.  Neuro: Alert and oriented x 4 , grossly normal neurologically.  Skin: Warm and dry, no rash or jaundice.   Psych: Alert and cooperative, normal mood and affect.  Labs: Lab Results  Component Value Date   CREATININE 0.97 12/16/2017   BUN 9 12/16/2017   NA 137 12/16/2017   K 4.1 12/16/2017   CL 103 12/16/2017   CO2 23 12/16/2017   Lab Results  Component Value Date   ALT 15 (L) 12/12/2017   AST 18 12/12/2017   ALKPHOS 86 12/12/2017   BILITOT 0.4 12/12/2017   Lab Results  Component Value Date   WBC 11.0 (H) 12/16/2017   HGB 11.7 (L) 12/16/2017   HCT 35.2 (L) 12/16/2017   MCV 87.1 12/16/2017   PLT 428 (H) 12/16/2017   No results found for: IRON, TIBC, FERRITIN  Imaging Studies: No results found.

## 2018-02-07 NOTE — Progress Notes (Signed)
CC'D TO PCP °

## 2018-02-13 DIAGNOSIS — Z933 Colostomy status: Secondary | ICD-10-CM | POA: Diagnosis not present

## 2018-02-18 ENCOUNTER — Telehealth: Payer: Self-pay

## 2018-02-18 NOTE — Telephone Encounter (Signed)
Pt called office. He will arrive at 8:30am tomorrow for [facility] at 9:30am. Advised to start drinking 2nd bottle of prep tomorrow at 4:30am. NPO after 6:30am. Called and informed Endo scheduler.

## 2018-02-18 NOTE — Telephone Encounter (Signed)
Tried to call pt to see if he can arrive earlier tomorrow for [facility] d/t a cancellation, no answer, LMOVM for return call.

## 2018-02-19 ENCOUNTER — Encounter (HOSPITAL_COMMUNITY): Payer: Self-pay | Admitting: *Deleted

## 2018-02-19 ENCOUNTER — Encounter (HOSPITAL_COMMUNITY): Payer: Self-pay | Admitting: Certified Registered"

## 2018-02-19 ENCOUNTER — Encounter (HOSPITAL_COMMUNITY)
Admission: RE | Disposition: A | Discharge status: Home / Self Care | Payer: Self-pay | Source: Ambulatory Visit | Attending: Internal Medicine

## 2018-02-19 ENCOUNTER — Other Ambulatory Visit: Payer: Self-pay

## 2018-02-19 ENCOUNTER — Ambulatory Visit (HOSPITAL_COMMUNITY)
Admission: RE | Admit: 2018-02-19 | Discharge: 2018-02-19 | Disposition: A | Discharge status: Home / Self Care | Payer: Medicare Other | Source: Ambulatory Visit | Attending: Internal Medicine | Admitting: Internal Medicine

## 2018-02-19 DIAGNOSIS — Z794 Long term (current) use of insulin: Secondary | ICD-10-CM | POA: Diagnosis not present

## 2018-02-19 DIAGNOSIS — Z933 Colostomy status: Secondary | ICD-10-CM | POA: Insufficient documentation

## 2018-02-19 DIAGNOSIS — I2510 Atherosclerotic heart disease of native coronary artery without angina pectoris: Secondary | ICD-10-CM | POA: Insufficient documentation

## 2018-02-19 DIAGNOSIS — Z7982 Long term (current) use of aspirin: Secondary | ICD-10-CM | POA: Insufficient documentation

## 2018-02-19 DIAGNOSIS — K5730 Diverticulosis of large intestine without perforation or abscess without bleeding: Secondary | ICD-10-CM | POA: Insufficient documentation

## 2018-02-19 DIAGNOSIS — K219 Gastro-esophageal reflux disease without esophagitis: Secondary | ICD-10-CM | POA: Insufficient documentation

## 2018-02-19 DIAGNOSIS — Z951 Presence of aortocoronary bypass graft: Secondary | ICD-10-CM | POA: Diagnosis not present

## 2018-02-19 DIAGNOSIS — E7800 Pure hypercholesterolemia, unspecified: Secondary | ICD-10-CM | POA: Insufficient documentation

## 2018-02-19 DIAGNOSIS — D123 Benign neoplasm of transverse colon: Secondary | ICD-10-CM | POA: Insufficient documentation

## 2018-02-19 DIAGNOSIS — E1151 Type 2 diabetes mellitus with diabetic peripheral angiopathy without gangrene: Secondary | ICD-10-CM | POA: Insufficient documentation

## 2018-02-19 DIAGNOSIS — Z79899 Other long term (current) drug therapy: Secondary | ICD-10-CM | POA: Insufficient documentation

## 2018-02-19 DIAGNOSIS — Z87891 Personal history of nicotine dependence: Secondary | ICD-10-CM | POA: Diagnosis not present

## 2018-02-19 DIAGNOSIS — E1122 Type 2 diabetes mellitus with diabetic chronic kidney disease: Secondary | ICD-10-CM | POA: Insufficient documentation

## 2018-02-19 DIAGNOSIS — E1140 Type 2 diabetes mellitus with diabetic neuropathy, unspecified: Secondary | ICD-10-CM | POA: Diagnosis not present

## 2018-02-19 DIAGNOSIS — Z1211 Encounter for screening for malignant neoplasm of colon: Secondary | ICD-10-CM

## 2018-02-19 DIAGNOSIS — N183 Chronic kidney disease, stage 3 (moderate): Secondary | ICD-10-CM | POA: Diagnosis not present

## 2018-02-19 DIAGNOSIS — I129 Hypertensive chronic kidney disease with stage 1 through stage 4 chronic kidney disease, or unspecified chronic kidney disease: Secondary | ICD-10-CM | POA: Insufficient documentation

## 2018-02-19 HISTORY — PX: COLONOSCOPY: SHX5424

## 2018-02-19 HISTORY — PX: POLYPECTOMY: SHX5525

## 2018-02-19 LAB — GLUCOSE, CAPILLARY: GLUCOSE-CAPILLARY: 161 mg/dL — AB (ref 70–99)

## 2018-02-19 SURGERY — COLONOSCOPY
Anesthesia: Moderate Sedation

## 2018-02-19 MED ORDER — SODIUM CHLORIDE 0.9 % IV SOLN
INTRAVENOUS | Status: DC
  Administered 2018-02-19: 09:00:00 via INTRAVENOUS

## 2018-02-19 MED ORDER — ONDANSETRON HCL 4 MG/2ML IJ SOLN
INTRAMUSCULAR | Status: DC | PRN
Start: 2018-02-19 — End: 2018-02-19
  Administered 2018-02-19: 4 mg via INTRAVENOUS

## 2018-02-19 MED ORDER — MEPERIDINE HCL 100 MG/ML IJ SOLN
INTRAMUSCULAR | Status: AC
  Filled 2018-02-19: qty 2

## 2018-02-19 MED ORDER — SIMETHICONE 40 MG/0.6ML PO SUSP
ORAL | Status: DC | PRN
  Administered 2018-02-19: 15 mL

## 2018-02-19 MED ORDER — MEPERIDINE HCL 100 MG/ML IJ SOLN
INTRAMUSCULAR | Status: DC | PRN
  Administered 2018-02-19 (×3): 25 mg via INTRAVENOUS

## 2018-02-19 MED ORDER — ONDANSETRON HCL 4 MG/2ML IJ SOLN
INTRAMUSCULAR | Status: AC
  Filled 2018-02-19: qty 2

## 2018-02-19 MED ORDER — MIDAZOLAM HCL 5 MG/5ML IJ SOLN
INTRAMUSCULAR | Status: DC | PRN
  Administered 2018-02-19: 2 mg via INTRAVENOUS
  Administered 2018-02-19 (×2): 1 mg via INTRAVENOUS

## 2018-02-19 MED ORDER — MIDAZOLAM HCL 5 MG/5ML IJ SOLN
INTRAMUSCULAR | Status: AC
  Filled 2018-02-19: qty 10

## 2018-02-19 NOTE — Op Note (Signed)
[facility] Patient Name: [name] Procedure Date: 02/19/2018 8:56 AM MRN: [id number] Date of Birth: 01-27-1946 Attending MD: Norvel Richards , MD CSN: [id number] Age: 72 Admit Type: Outpatient Procedure:                Colonoscopy through colostomy and proctoscopy Indications:              Screening for colorectal malignant neoplasm; recent                            segmental resection for complicated diverticulitis. Providers:                Norvel Richards, MD, Charlsie Quest Theda Sers RN, RN,                            Randa Spike, Technician Referring MD:             Storm Frisk, MD, [name] Medicines:                Midazolam 4 mg IV, Meperidine 75 mg IV, Ondansetron                            4 mg IV Complications:            No immediate complications. Estimated Blood Loss:     Estimated blood loss was minimal. Procedure:                Pre-Anesthesia Assessment:                           - Prior to the procedure, a History and Physical                            was performed, and patient medications and                            allergies were reviewed. The patient's tolerance of                            previous anesthesia was also reviewed. The risks                            and benefits of the procedure and the sedation                            options and risks were discussed with the patient.                            All questions were answered, and informed consent                            was obtained. Prior Anticoagulants: The patient has                            taken no previous anticoagulant or antiplatelet  agents. ASA Grade Assessment: II - A patient with                            mild systemic disease. After reviewing the risks                            and benefits, the patient was deemed in                            satisfactory condition to undergo the procedure.                           After  obtaining informed consent, the colonoscope                            was passed under direct vision. Throughout the                            procedure, the patient's blood pressure, pulse, and                            oxygen saturations were monitored continuously. The                            [id number]-[id number] ([id number]) scope was introduced through                            the anus and advanced to the blood in the rectal                            stump., The colonoscopy through colostomy without                            difficulty. The patient tolerated the procedure                            well. The quality of the bowel preparation was                            adequate. The ileocecal valve, the appendiceal                            orifice and the rectum were photographed. Scope In: 9:26:11 AM Scope Out: 9:43:14 AM Scope Withdrawal Time: 0 hours 6 minutes 39 seconds  Total Procedure Duration: 0 hours 17 minutes 3 seconds  Findings:      The perianal and digital rectal examinations were normal. rectal stump       ended at 15 cm. Residual stool concretion present. Rectal mucosa       appeared normal. Colonoscopy performed. Colostomy.      A few medium-mouthed diverticula were found in the entire colon.      A 5 mm polyp was found in the hepatic flexure. The polyp was sessile.       The polyp was removed with a cold snare.  Resection and retrieval were       complete. Estimated blood loss was minimal.      The exam was otherwise without abnormality. Impression:               - few scattered diverticula in the entire residual                            colon. Normal-appearing residual rectal stump.                           - One 5 mm polyp at the hepatic flexure, removed                            with a cold snare. Resected and retrieved.                           - The examination was otherwise normal. Moderate Sedation:      Moderate (conscious) sedation was administered by  the endoscopy nurse       and supervised by the endoscopist. The following parameters were       monitored: oxygen saturation, heart rate, blood pressure, respiratory       rate, EKG, adequacy of pulmonary ventilation, and response to care.       Total physician intraservice time was 27 minutes. Recommendation:           - Patient has a contact number available for                            emergencies. The signs and symptoms of potential                            delayed complications were discussed with the                            patient. Return to normal activities tomorrow.                            Written discharge instructions were provided to the                            patient.                           - Resume previous diet.                           - Continue present medications.                           - Repeat colonoscopy date to be determined after                            pending pathology results are reviewed for                            surveillance.                           -  Return to GI office after studies are complete.                            Follow-up pathology. Plan to see to see Dr. Georgette Dover                            for colostomy takedown. Procedure Code(s):        --- Professional ---                           [phone], Colonoscopy, flexible; with removal of                            tumor(s), polyp(s), or other lesion(s) by snare                            technique                           G0500, Moderate sedation services provided by the                            same physician or other qualified health care                            professional performing a gastrointestinal                            endoscopic service that sedation supports,                            requiring the presence of an independent trained                            observer to assist in the monitoring of the                            patient's level of  consciousness and physiological                            status; initial 15 minutes of intra-service time;                            patient age 18 years or older (additional time may                            be reported with [phone], as appropriate)                           [phone], Moderate sedation services provided by the                            same physician or other qualified health care  professional performing the diagnostic or                            therapeutic service that the sedation supports,                            requiring the presence of an independent trained                            observer to assist in the monitoring of the                            patient's level of consciousness and physiological                            status; each additional 15 minutes intraservice                            time (List separately in addition to code for                            primary service) Diagnosis Code(s):        --- Professional ---                           Z12.11, Encounter for screening for malignant                            neoplasm of colon                           D12.3, Benign neoplasm of transverse colon (hepatic                            flexure or splenic flexure)                           K57.30, Diverticulosis of large intestine without                            perforation or abscess without bleeding CPT copyright 2017 American Medical Association. All rights reserved. The codes documented in this report are preliminary and upon coder review may  be revised to meet current compliance requirements. Cristopher Estimable. Zailyn Rowser, MD Norvel Richards, MD 02/19/2018 9:58:52 AM This report has been signed electronically. Number of Addenda: 0

## 2018-02-19 NOTE — Interval H&P Note (Signed)
History and Physical Interval Note:  02/19/2018 9:09 AM  [name]  has presented today for surgery, with the diagnosis of history of colostomy  The various methods of treatment have been discussed with the patient and family. After consideration of risks, benefits and other options for treatment, the patient has consented to  Procedure(s) with comments: COLONOSCOPY (N/A) - 2:00pm as a surgical intervention .  The patient's history has been reviewed, patient examined, no change in status, stable for surgery.  I have reviewed the patient's chart and labs.  Questions were answered to the patient's satisfaction.     Darleny Sem  No change.  The colonoscopy per colostomy and proctoscopy to clear his lower GI tract prior to re-anastomosis per plan.  The risks, benefits, limitations, alternatives and imponderables have been reviewed with the patient. Questions have been answered. All parties are agreeable.

## 2018-02-19 NOTE — Discharge Instructions (Signed)
Colonoscopy Discharge Instructions  Read the instructions outlined below and refer to this sheet in the next few weeks. These discharge instructions provide you with general information on caring for yourself after you leave the hospital. Your doctor may also give you specific instructions. While your treatment has been planned according to the most current medical practices available, unavoidable complications occasionally occur. If you have any problems or questions after discharge, call Dr. Gala Romney at [phone]. ACTIVITY  You may resume your regular activity, but move at a slower pace for the next 24 hours.   Take frequent rest periods for the next 24 hours.   Walking will help get rid of the air and reduce the bloated feeling in your belly (abdomen).   No driving for 24 hours (because of the medicine (anesthesia) used during the test).    Do not sign any important legal documents or operate any machinery for 24 hours (because of the anesthesia used during the test).  NUTRITION  Drink plenty of fluids.   You may resume your normal diet as instructed by your doctor.   Begin with a light meal and progress to your normal diet. Heavy or fried foods are harder to digest and may make you feel sick to your stomach (nauseated).   Avoid alcoholic beverages for 24 hours or as instructed.  MEDICATIONS  You may resume your normal medications unless your doctor tells you otherwise.  WHAT YOU CAN EXPECT TODAY  Some feelings of bloating in the abdomen.   Passage of more gas than usual.   Spotting of blood in your stool or on the toilet paper.  IF YOU HAD POLYPS REMOVED DURING THE COLONOSCOPY:  No aspirin products for 7 days or as instructed.   No alcohol for 7 days or as instructed.   Eat a soft diet for the next 24 hours.  FINDING OUT THE RESULTS OF YOUR TEST Not all test results are available during your visit. If your test results are not back during the visit, make an appointment  with your caregiver to find out the results. Do not assume everything is normal if you have not heard from your caregiver or the medical facility. It is important for you to follow up on all of your test results.  SEEK IMMEDIATE MEDICAL ATTENTION IF:  You have more than a spotting of blood in your stool.   Your belly is swollen (abdominal distention).   You are nauseated or vomiting.   You have a temperature over 101.   You have abdominal pain or discomfort that is severe or gets worse throughout the day.    Colon polyp and diverticulosis information provided  Further recommendations to follow pending review of pathology report  See Dr. Georgette Dover to make plans for colostomy takedown.   Diverticulosis Diverticulosis is a condition that develops when small pouches (diverticula) form in the wall of the large intestine (colon). The colon is where water is absorbed and stool is formed. The pouches form when the inside layer of the colon pushes through weak spots in the outer layers of the colon. You may have a few pouches or many of them. What are the causes? The cause of this condition is not known. What increases the risk? The following factors may make you more likely to develop this condition:  Being older than age 75. Your risk for this condition increases with age. Diverticulosis is rare among people younger than age 52. By age 67, many people have it.  Eating  a low-fiber diet.  Having frequent constipation.  Being overweight.  Not getting enough exercise.  Smoking.  Taking over-the-counter pain medicines, like aspirin and ibuprofen.  Having a family history of diverticulosis.  What are the signs or symptoms? In most people, there are no symptoms of this condition. If you do have symptoms, they may include:  Bloating.  Cramps in the abdomen.  Constipation or diarrhea.  Pain in the lower left side of the abdomen.  How is this diagnosed? This condition is most often  diagnosed during an exam for other colon problems. Because diverticulosis usually has no symptoms, it often cannot be diagnosed independently. This condition may be diagnosed by:  Using a flexible scope to examine the colon (colonoscopy).  Taking an X-ray of the colon after dye has been put into the colon (barium enema).  Doing a CT scan.  How is this treated? You may not need treatment for this condition if you have never developed an infection related to diverticulosis. If you have had an infection before, treatment may include:  Eating a high-fiber diet. This may include eating more fruits, vegetables, and grains.  Taking a fiber supplement.  Taking a live bacteria supplement (probiotic).  Taking medicine to relax your colon.  Taking antibiotic medicines.  Follow these instructions at home:  Drink 6-8 glasses of water or more each day to prevent constipation.  Try not to strain when you have a bowel movement.  If you have had an infection before: ? Eat more fiber as directed by your health care provider or your diet and nutrition specialist (dietitian). ? Take a fiber supplement or probiotic, if your health care provider approves.  Take over-the-counter and prescription medicines only as told by your health care provider.  If you were prescribed an antibiotic, take it as told by your health care provider. Do not stop taking the antibiotic even if you start to feel better.  Keep all follow-up visits as told by your health care provider. This is important. Contact a health care provider if:  You have pain in your abdomen.  You have bloating.  You have cramps.  You have not had a bowel movement in 3 days. Get help right away if:  Your pain gets worse.  Your bloating becomes very bad.  You have a fever or chills, and your symptoms suddenly get worse.  You vomit.  You have bowel movements that are bloody or black.  You have bleeding from your  rectum. Summary  Diverticulosis is a condition that develops when small pouches (diverticula) form in the wall of the large intestine (colon).  You may have a few pouches or many of them.  This condition is most often diagnosed during an exam for other colon problems.  If you have had an infection related to diverticulosis, treatment may include increasing the fiber in your diet, taking supplements, or taking medicines. This information is not intended to replace advice given to you by your health care provider. Make sure you discuss any questions you have with your health care provider. Document Released: 04/27/2004 Document Revised: 06/19/2016 Document Reviewed: 06/19/2016 Elsevier Interactive Patient Education  2017 [employer].  Colon Polyps Polyps are tissue growths inside the body. Polyps can grow in many places, including the large intestine (colon). A polyp may be a round bump or a mushroom-shaped growth. You could have one polyp or several. Most colon polyps are noncancerous (benign). However, some colon polyps can become cancerous over time. What are the  causes? The exact cause of colon polyps is not known. What increases the risk? This condition is more likely to develop in people who:  Have a family history of colon cancer or colon polyps.  Are older than 78 or older than 45 if they are African American.  Have inflammatory bowel disease, such as ulcerative colitis or Crohn disease.  Are overweight.  Smoke cigarettes.  Do not get enough exercise.  Drink too much alcohol.  Eat a diet that is: ? High in fat and red meat. ? Low in fiber.  Had childhood cancer that was treated with abdominal radiation.  What are the signs or symptoms? Most polyps do not cause symptoms. If you have symptoms, they may include:  Blood coming from your rectum when having a bowel movement.  Blood in your stool.The stool may look dark red or black.  A change in bowel habits, such  as constipation or diarrhea.  How is this diagnosed? This condition is diagnosed with a colonoscopy. This is a procedure that uses a lighted, flexible scope to look at the inside of your colon. How is this treated? Treatment for this condition involves removing any polyps that are found. Those polyps will then be tested for cancer. If cancer is found, your health care provider will talk to you about options for colon cancer treatment. Follow these instructions at home: Diet  Eat plenty of fiber, such as fruits, vegetables, and whole grains.  Eat foods that are high in calcium and vitamin D, such as milk, cheese, yogurt, eggs, liver, fish, and broccoli.  Limit foods high in fat, red meats, and processed meats, such as hot dogs, sausage, bacon, and lunch meats.  Maintain a healthy weight, or lose weight if recommended by your health care provider. General instructions  Do not smoke cigarettes.  Do not drink alcohol excessively.  Keep all follow-up visits as told by your health care provider. This is important. This includes keeping regularly scheduled colonoscopies. Talk to your health care provider about when you need a colonoscopy.  Exercise every day or as told by your health care provider. Contact a health care provider if:  You have new or worsening bleeding during a bowel movement.  You have new or increased blood in your stool.  You have a change in bowel habits.  You unexpectedly lose weight. This information is not intended to replace advice given to you by your health care provider. Make sure you discuss any questions you have with your health care provider. Document Released: 04/26/2004 Document Revised: 01/06/2016 Document Reviewed: 06/21/2015 Elsevier Interactive Patient Education  [employer].

## 2018-02-21 ENCOUNTER — Encounter: Payer: Self-pay | Admitting: Internal Medicine

## 2018-02-25 ENCOUNTER — Encounter (HOSPITAL_COMMUNITY): Payer: Self-pay | Admitting: Internal Medicine

## 2018-03-05 ENCOUNTER — Other Ambulatory Visit: Payer: Self-pay | Admitting: Physician Assistant

## 2018-03-08 ENCOUNTER — Ambulatory Visit: Payer: Self-pay | Admitting: Surgery

## 2018-03-08 NOTE — H&P (Signed)
History of Present Illness [name]. Anthony Mareno MD; 03/08/2018 12:32 PM) The patient is a 72 year old male who presents with diverticulitis. Status post urgent Hartmann's procedure on 12/13/17. The patient had been ill for several months with intermittent left lower quadrant abdominal pain associated with intermittent episodes of fever, rigors, and elevated white blood cell count. He also had some lower GI bleeding. 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 He was transferred to [facility] from [facility] because of cardiac concerns. He underwent Hartman's procedure that did not show significant contamination or inflammation in the abdomen. We resected a 15 cm segment of colon. This is fairly proximal in the sigmoid colon. Fortunately the pathology showed no sign of malignancy. He did have a 7 cm abscess from a perforated diverticulum. He was discharged home on 12/17/17. He is doing well. Appetite is back to normal, even better than prior to surgery. He has gained almost 10 lbs since surgery. He is not using any pain medication. He is fully mobile and feels much better than he did prior surgery. He states that he feels better than he has in a year.  Over the last few weeks, he has developed some bulging around his colostomy. He continues to function well. No change in output. He presents today to discuss colostomy reversal.   Problem List/Past Medical [name]. 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 [name], MD; 03/08/2018 12:32 PM) Colonoscopy 1-5 years ago  Allergies [name], MD; 03/08/2018  12:32 PM) No Known Allergies [12/28/2017]:  Medication History [name]. [name], MD; 03/08/2018 12:32 PM) Insulin Glargine (100UNIT/ML Soln Cartridge, Subcutaneous) Active. Metoprolol Tartrate (25MG  Tablet, Oral) Active. Linzess (145MCG Capsule, Oral) Active. Aspirin (81MG  Tablet DR, Oral) Active. Vitamin D (2000UNIT Tablet, Oral) Active. Pletal (100MG  Tablet, Oral) Active. Ferrous Sulfate (325 (65 Fe)MG Tablet, Oral) Active. Gabapentin (300MG  Capsule, Oral) Active. Ibuprofen (200MG  Tablet, Oral) Active. Simvastatin (40MG  Tablet, Oral) Active. Vitamin B-12 (1000MCG Tablet, Oral) Active. Protonix (40MG  Tablet DR, Oral) Active. Medications Reconciled Plavix (75MG  Tablet, Oral) Active.  Social History [name]. [name], MD; 03/08/2018 12:32 PM) Alcohol use Remotely quit alcohol use. Caffeine use Carbonated beverages, Coffee, Tea. No drug use Tobacco use Former smoker.  Family History [name]. [name], MD; 03/08/2018 12:32 PM) Cancer Brother. Heart Disease Mother. Heart disease in male family member before age 52 Respiratory Condition Brother.  Other Problems [name]. [name], MD; 03/08/2018 12:32 PM) Diabetes Mellitus Diverticulosis Gastroesophageal Reflux Disease High blood pressure Vascular Disease    Vitals ([name] CMA; 03/08/2018 9:45 AM) 03/08/2018 9:45 AM Weight: 150.38 lb Height: 66in Body Surface Area: 1.77 m Body Mass Index: 24.27 kg/m  Temp.: 97.54F  Pulse: 96 (Regular)  P.OX: 97% (Room air) BP: 126/72 (Sitting, Left Arm, Standard)      Physical Exam 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 The surgical procedure has been discussed with the patient. Potential risks, benefits, alternative treatments, and expected outcomes have been explained. All of the patient's questions at this time have been answered. The likelihood of reaching the patient's treatment goal is good. The patient understand the proposed surgical procedure and wishes to proceed.  [name]. Georgette Dover, MD, [facility]  03/08/2018 12:34 PM

## 2018-03-08 NOTE — H&P (View-Only) (Signed)
History of Present Illness [name]. Judith Campillo MD; 03/08/2018 12:32 PM) The patient is a 72 year old male who presents with diverticulitis. Status post urgent Hartmann's procedure on 12/13/17. The patient had been ill for several months with intermittent left lower quadrant abdominal pain associated with intermittent episodes of fever, rigors, and elevated white blood cell count. He also had some lower GI bleeding. 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 He was transferred to [facility] from [facility] because of cardiac concerns. He underwent Hartman's procedure that did not show significant contamination or inflammation in the abdomen. We resected a 15 cm segment of colon. This is fairly proximal in the sigmoid colon. Fortunately the pathology showed no sign of malignancy. He did have a 7 cm abscess from a perforated diverticulum. He was discharged home on 12/17/17. He is doing well. Appetite is back to normal, even better than prior to surgery. He has gained almost 10 lbs since surgery. He is not using any pain medication. He is fully mobile and feels much better than he did prior surgery. He states that he feels better than he has in a year.  Over the last few weeks, he has developed some bulging around his colostomy. He continues to function well. No change in output. He presents today to discuss colostomy reversal.   Problem List/Past Medical [name]. 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 Damiana Berrian, MD; 03/08/2018 12:32 PM) Bypass Surgery for Poor Blood Flow to Legs Carotid Artery Surgery Bilateral. Coronary Artery Bypass Graft Valve Replacement  Diagnostic Studies History [name]. Corneilus Heggie, MD; 03/08/2018 12:32 PM) Colonoscopy 1-5 years ago  Allergies Rodman Key K. Brittain Smithey, MD; 03/08/2018  12:32 PM) No Known Allergies [12/28/2017]:  Medication History [name]. Emil Klassen, MD; 03/08/2018 12:32 PM) Insulin Glargine (100UNIT/ML Soln Cartridge, Subcutaneous) Active. Metoprolol Tartrate (25MG  Tablet, Oral) Active. Linzess (145MCG Capsule, Oral) Active. Aspirin (81MG  Tablet DR, Oral) Active. Vitamin D (2000UNIT Tablet, Oral) Active. Pletal (100MG  Tablet, Oral) Active. Ferrous Sulfate (325 (65 Fe)MG Tablet, Oral) Active. Gabapentin (300MG  Capsule, Oral) Active. Ibuprofen (200MG  Tablet, Oral) Active. Simvastatin (40MG  Tablet, Oral) Active. Vitamin B-12 (1000MCG Tablet, Oral) Active. Protonix (40MG  Tablet DR, Oral) Active. Medications Reconciled Plavix (75MG  Tablet, Oral) Active.  Social History [name]. Gwendolyn Nishi, MD; 03/08/2018 12:32 PM) Alcohol use Remotely quit alcohol use. Caffeine use Carbonated beverages, Coffee, Tea. No drug use Tobacco use Former smoker.  Family History [name]. Amisha Pospisil, MD; 03/08/2018 12:32 PM) Cancer Brother. Heart Disease Mother. Heart disease in male family member before age 10 Respiratory Condition Brother.  Other Problems [name]. Ramiel Forti, MD; 03/08/2018 12:32 PM) Diabetes Mellitus Diverticulosis Gastroesophageal Reflux Disease High blood pressure Vascular Disease    Vitals ([name] CMA; 03/08/2018 9:45 AM) 03/08/2018 9:45 AM Weight: 150.38 lb Height: 66in Body Surface Area: 1.77 m Body Mass Index: 24.27 kg/m  Temp.: 97.66F  Pulse: 96 (Regular)  P.OX: 97% (Room air) BP: 126/72 (Sitting, Left Arm, Standard)      Physical Exam Rodman Key K. Nakul Avino MD; 03/08/2018 12:33 PM)  The physical exam findings are as follows: Note:WDWN in NAD Eyes: Pupils equal, round; sclera anicteric HENT: Oral mucosa moist; good dentition Neck: No masses palpated, no thyromegaly Lungs: CTA bilaterally; normal respiratory effort CV: Regular rate and rhythm; no murmurs; extremities well-perfused with no  edema Abd: +bowel sounds, soft, non-tender, well-healed midline incision LLQ  ostomy with spontaneously reducible parastomal hernia Skin: Warm, dry; no sign of jaundice Psychiatric - alert and oriented x 4; calm mood and affect    Assessment & Plan Rodman Key K. 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 The surgical procedure has been discussed with the patient. Potential risks, benefits, alternative treatments, and expected outcomes have been explained. All of the patient's questions at this time have been answered. The likelihood of reaching the patient's treatment goal is good. The patient understand the proposed surgical procedure and wishes to proceed.  [name]. Georgette Dover, MD, [facility]  03/08/2018 12:34 PM

## 2018-03-18 ENCOUNTER — Encounter (HOSPITAL_COMMUNITY): Payer: Medicare Other

## 2018-03-18 ENCOUNTER — Ambulatory Visit: Payer: Medicare Other | Admitting: Family

## 2018-03-18 DIAGNOSIS — Z933 Colostomy status: Secondary | ICD-10-CM | POA: Diagnosis not present

## 2018-03-19 NOTE — Pre-Procedure Instructions (Signed)
[name]  03/19/2018      [facility] [address], [place] [address] Phone: [phone] Fax: [phone]    Your procedure is scheduled on Aug. 13  Report to [facility] Admitting at 8:00  A.M.  Call this number if you have problems the morning of surgery:  [phone]   Remember:      DRINK 2 ENSURE BOTTLES THE NIGHT BEFORE SURGERY.   Do not eat  after midnight.   You may drink clear liquids until 7:00 A.M.    Drink ensure bottle  By 7:00    Clear liquids allowed are:                    Water, Juice (non-citric and without pulp), Carbonated beverages, Clear Tea, Black Coffee only, Plain Jell-O only, Gatorade and Plain Popsicles only    Take these medicines the morning of surgery with A SIP OF WATER :              Albuterol inhaler -bring to hospital             Gabapentin (neurontin)             Metoprolol tartrate (lopressor)             Pantoprazole (protonix)             7 days prior to surgery STOP taking Pletal, Aleve, Naproxen, Ibuprofen, Motrin, Advil, Goody's, BC's, all herbal medications, fish oil, and all vitamins             Follow your surgeon's instructions on when to stop Asprin.  If no instructions were given by your surgeon then you will need to call the office to get those instructions.           How to Manage Your Diabetes Before and After Surgery  Why is it important to control my blood sugar before and after surgery? . Improving blood sugar levels before and after surgery helps healing and can limit problems. . A way of improving blood sugar control is eating a healthy diet by: o  Eating less sugar and carbohydrates o  Increasing activity/exercise o  Talking with your doctor about reaching your blood sugar goals . High blood sugars (greater than 180 mg/dL) can raise your risk of infections and slow your recovery, so you will need to focus on controlling your diabetes during the weeks  before surgery. . Make sure that the doctor who takes care of your diabetes knows about your planned surgery including the date and location.  How do I manage my blood sugar before surgery? . Check your blood sugar at least 4 times a day, starting 2 days before surgery, to make sure that the level is not too high or low. o Check your blood sugar the morning of your surgery when you wake up and every 2 hours until you get to the Short Stay unit. . If your blood sugar is less than 70 mg/dL, you will need to treat for low blood sugar: o Do not take insulin. o Treat a low blood sugar (less than 70 mg/dL) with  cup of clear juice (cranberry or apple), 4 glucose tablets, OR glucose gel. Recheck blood sugar in 15 minutes after treatment (to make sure it is greater than 70 mg/dL). If your blood sugar is not greater than 70 mg/dL on recheck, call [phone] o  for further instructions. . Report your blood sugar to the short stay nurse when you get to Short Stay.  . If you are admitted to the hospital after surgery: o Your blood sugar will be checked by the staff and you will probably be given insulin after surgery (instead of oral diabetes medicines) to make sure you have good blood sugar levels. o The goal for blood sugar control after surgery is 80-180 mg/dL.          WHAT DO I DO ABOUT MY DIABETES MEDICATION?   [name] Do not take oral diabetes medicines (pills) the morning of surgery. .   . THE NIGHT BEFORE SURGERY, take ______12_____ units of _____LANTUS______insulin.         Do not wear jewelry.  Do not wear lotions, powders, or perfumes, or deodorant.  Do not shave 48 hours prior to surgery.  Men may shave face and neck.  Do not bring valuables to the hospital.  [facility] is not responsible for any belongings or valuables.  Contacts, dentures or bridgework may not be worn into surgery.  Leave your suitcase in the car.  After surgery it may be brought to your room.  For patients  admitted to the hospital, discharge time will be determined by your treatment team.  Patients discharged the day of surgery will not be allowed to drive home.    Special instructions:  [place]- Preparing For Surgery  Before surgery, you can play an important role. Because skin is not sterile, your skin needs to be as free of germs as possible. You can reduce the number of germs on your skin by washing with CHG (chlorahexidine gluconate) Soap before surgery.  CHG is an antiseptic cleaner which kills germs and bonds with the skin to continue killing germs even after washing.    Oral Hygiene is also important to reduce your risk of infection.  Remember - BRUSH YOUR TEETH THE MORNING OF SURGERY WITH YOUR REGULAR TOOTHPASTE  Please do not use if you have an allergy to CHG or antibacterial soaps. If your skin becomes reddened/irritated stop using the CHG.  Do not shave (including legs and underarms) for at least 48 hours prior to first CHG shower. It is OK to shave your face.  Please follow these instructions carefully.   1. Shower the NIGHT BEFORE SURGERY and the MORNING OF SURGERY with CHG.   2. If you chose to wash your hair, wash your hair first as usual with your normal shampoo.  3. After you shampoo, rinse your hair and body thoroughly to remove the shampoo.  4. Use CHG as you would any other liquid soap. You can apply CHG directly to the skin and wash gently with a scrungie or a clean washcloth.   5. Apply the CHG Soap to your body ONLY FROM THE NECK DOWN.  Do not use on open wounds or open sores. Avoid contact with your eyes, ears, mouth and genitals (private parts). Wash Face and genitals (private parts)  with your normal soap.  6. Wash thoroughly, paying special attention to the area where your surgery will be performed.  7. Thoroughly rinse your body with warm water from the neck down.  8. DO NOT shower/wash with your normal soap after using and rinsing off the CHG  Soap.  9. Pat yourself dry with a CLEAN TOWEL.  10. Wear CLEAN PAJAMAS to bed the night before surgery, wear comfortable clothes the morning of surgery  11. Place CLEAN SHEETS on  your bed the night of your first shower and DO NOT SLEEP WITH PETS.    Day of Surgery:  Do not apply any deodorants/lotions.  Please wear clean clothes to the hospital/surgery center.   Remember to brush your teeth WITH YOUR REGULAR TOOTHPASTE.    Please read over the following fact sheets that you were given. Coughing and Deep Breathing and Surgical Site Infection Prevention

## 2018-03-20 ENCOUNTER — Other Ambulatory Visit: Payer: Self-pay

## 2018-03-20 ENCOUNTER — Encounter (HOSPITAL_COMMUNITY): Payer: Self-pay

## 2018-03-20 ENCOUNTER — Encounter (HOSPITAL_COMMUNITY)
Admission: RE | Admit: 2018-03-20 | Discharge: 2018-03-20 | Disposition: A | Discharge status: Home / Self Care | Payer: Medicare Other | Source: Ambulatory Visit | Attending: Surgery | Admitting: Surgery

## 2018-03-20 DIAGNOSIS — N183 Chronic kidney disease, stage 3 (moderate): Secondary | ICD-10-CM | POA: Insufficient documentation

## 2018-03-20 DIAGNOSIS — I2510 Atherosclerotic heart disease of native coronary artery without angina pectoris: Secondary | ICD-10-CM | POA: Insufficient documentation

## 2018-03-20 DIAGNOSIS — Z9889 Other specified postprocedural states: Secondary | ICD-10-CM | POA: Diagnosis not present

## 2018-03-20 DIAGNOSIS — Z951 Presence of aortocoronary bypass graft: Secondary | ICD-10-CM | POA: Diagnosis not present

## 2018-03-20 DIAGNOSIS — Z794 Long term (current) use of insulin: Secondary | ICD-10-CM | POA: Diagnosis not present

## 2018-03-20 DIAGNOSIS — I129 Hypertensive chronic kidney disease with stage 1 through stage 4 chronic kidney disease, or unspecified chronic kidney disease: Secondary | ICD-10-CM | POA: Insufficient documentation

## 2018-03-20 DIAGNOSIS — Z7982 Long term (current) use of aspirin: Secondary | ICD-10-CM | POA: Insufficient documentation

## 2018-03-20 DIAGNOSIS — I739 Peripheral vascular disease, unspecified: Secondary | ICD-10-CM | POA: Insufficient documentation

## 2018-03-20 DIAGNOSIS — E1122 Type 2 diabetes mellitus with diabetic chronic kidney disease: Secondary | ICD-10-CM | POA: Insufficient documentation

## 2018-03-20 DIAGNOSIS — Z79899 Other long term (current) drug therapy: Secondary | ICD-10-CM | POA: Diagnosis not present

## 2018-03-20 DIAGNOSIS — Z01812 Encounter for preprocedural laboratory examination: Secondary | ICD-10-CM | POA: Insufficient documentation

## 2018-03-20 LAB — BASIC METABOLIC PANEL
Anion gap: 13 (ref 5–15)
BUN: 16 mg/dL (ref 8–23)
CALCIUM: 9.1 mg/dL (ref 8.9–10.3)
CHLORIDE: 96 mmol/L — AB (ref 98–111)
CO2: 29 mmol/L (ref 22–32)
CREATININE: 1.3 mg/dL — AB (ref 0.61–1.24)
GFR, EST NON AFRICAN AMERICAN: 54 mL/min — AB (ref >60)
Glucose, Bld: 154 mg/dL — ABNORMAL HIGH (ref 70–99)
Potassium: 3.4 mmol/L — ABNORMAL LOW (ref 3.5–5.1)
SODIUM: 138 mmol/L (ref 135–145)

## 2018-03-20 LAB — CBC
HCT: 48.8 % (ref 39.0–52.0)
Hemoglobin: 16.1 g/dL (ref 13.0–17.0)
MCH: 29.7 pg (ref 26.0–34.0)
MCHC: 33 g/dL (ref 30.0–36.0)
MCV: 90 fL (ref 78.0–100.0)
PLATELETS: 262 10*3/uL (ref 150–400)
RBC: 5.42 MIL/uL (ref 4.22–5.81)
RDW: 14.5 % (ref 11.5–15.5)
WBC: 7.7 10*3/uL (ref 4.0–10.5)

## 2018-03-20 LAB — HEMOGLOBIN A1C
HEMOGLOBIN A1C: 8.6 % — AB (ref 4.8–5.6)
MEAN PLASMA GLUCOSE: 200.12 mg/dL

## 2018-03-20 LAB — GLUCOSE, CAPILLARY: Glucose-Capillary: 166 mg/dL — ABNORMAL HIGH (ref 70–99)

## 2018-03-20 NOTE — Progress Notes (Signed)
PCP: Dr. [name] @ [facility] [place]  Cardiologist: Dr. Gwenlyn Found -last visit 1 yr. Ago   Pt. Instructed last dose of aspirin and plavix 03/21/18 per Dr. Georgette Dover  Fasting sugars 150-160

## 2018-03-21 NOTE — Progress Notes (Signed)
Anesthesia Chart Review:   Case:  [id number] Date/Time:  03/26/18 0945   Procedure:  COLOSTOMY REVERSAL (N/A )   Anesthesia type:  General   Pre-op diagnosis:  Colostomy after Hartmann's procedure   Location:  [facility] OR ROOM 09 / [facility]   Surgeon:  Donnie Mesa, MD      DISCUSSION: - Pt is a 72 year old male with hx CAD (s/p CABG 2017), PVD (s/p FFBG and FPBG, s/p multiple LE stents (last 09/29/16), s/p  iliofemoral endarterectomy 08/09/16), HTN, DM  - Hospitalized 5/1-12/17/17 for sepsis due to apparent diverticular disease, sigmoid mass found (s/p sigmoid colectomy 12/13/17, pathology benign)  - Repeated other prior hospitalizations for sepsis over prior 12 months   - Last dose ASA/plavix 03/21/18    VS: BP (!) 148/84   Pulse 65   Temp 36.7 C   Resp 20   Ht 5\' 7"  (1.702 m)   Wt 69 kg   SpO2 100%   BMI 23.84 kg/m    PROVIDERS: - PCP is [name], MD - Cardiologist is Quay Burow, MD. Last office visit 03/09/17. 6 month f/u recommended but has not happened.  - Vascular surgeon is Harold Barban, MD. Last office visit 09/10/17. 6 month f/u recommended but has not happened.    LABS: Labs reviewed: Acceptable for surgery. (all labs ordered are listed, but only abnormal results are displayed)  Labs Reviewed  GLUCOSE, CAPILLARY - Abnormal; Notable for the following components:      Result Value   Glucose-Capillary 166 (*)    All other components within normal limits  HEMOGLOBIN A1C - Abnormal; Notable for the following components:   Hgb A1c MFr Bld 8.6 (*)    All other components within normal limits  BASIC METABOLIC PANEL - Abnormal; Notable for the following components:   Potassium 3.4 (*)    Chloride 96 (*)    Glucose, Bld 154 (*)    Creatinine, Ser 1.30 (*)    GFR calc non Af Amer 54 (*)    All other components within normal limits  CBC     IMAGES:  1 view CXR 12/12/17: No active disease.  CT chest 11/27/17: - Previously seen right middle lobe nodular changes  are stable from previous exam dating back to 10/13/2015 - Scattered ground-glass densities are noted throughout the right lung likely postinflammatory in nature and improved from previous exams. - No new focal infiltrate is seen. - No acute rib abnormality is noted. - Aortic Atherosclerosis    EKG 10/01/17: Sinus tachycardia (110 bpm). Nonspecific intraventricular conduction delay   CV:  TEE 10/18/15 (at time of CABG):  - Left ventricle: The cavity size was normal. Wall thickness was normal. Systolic function was normal. The estimated ejection fraction was in the range of 50% to 55%. There was no dynamic obstruction. Hypokinesis of the anterior and anterolateral myocardium; consistent with ischemia. - Aortic valve: No evidence of vegetation. - Mitral valve: No evidence of vegetation. - Atrial septum: No defect or patent foramen ovale was identified. - Impressions: Ejection Fraction of 50%. Mild hypokinesis of the anterolateral and anterior walls. Trivial mitral and tricuspid regurgitation.  Last cardiac cath 10/11/15 prior to CABG   Past Medical History:  Diagnosis Date  . CAD S/P percutaneous coronary angioplasty 1998   PCI TO CX  . CKD (chronic kidney disease) stage 3, GFR 30-59 ml/min (HCC)   . Diabetes mellitus without complication ([place])   . Diverticulitis 08/16/2017   hospitalized with diverticulitis/sepsis  .  GERD (gastroesophageal reflux disease)    occassionally  . 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 Hypercholesteremia   . Hypertension   . Neuropathy   . Peripheral vascular disease ([place])    s/p R-L FEM-FEM BYPASS  . Pneumonia   . Tachycardia    after CABG, pt on medicine for this    Past Surgical History:  Procedure Laterality Date  . ABDOMINAL AORTOGRAM Bilateral 09/19/2016   Procedure: iliac;  Surgeon: Serafina Mitchell, MD;  Location: [facility];  Service: Cardiovascular;  Laterality: Bilateral;  . CARDIAC CATHETERIZATION  2003   with stent  . CARDIAC  CATHETERIZATION N/A 10/11/2015   Procedure: Left Heart Cath and Coronary Angiography;  Surgeon: Peter M Martinique, MD;  Location: [facility];  Service: Cardiovascular;  Laterality: N/A;  . COLON RESECTION N/A 12/13/2017   Procedure: EXPLORATORY LAPAROTOMY, SIGMOID COLECTOMY WITH COLOSTOMY;  Surgeon: Donnie Mesa, MD;  Location: [facility];  Service: General;  Laterality: N/A;  . COLONOSCOPY N/A 09/22/2013   Procedure: COLONOSCOPY;  Surgeon: Daneil Dolin, MD;  [facility];  Service: Endoscopy;  Laterality: N/A;  9:30 AM  . COLONOSCOPY N/A 02/19/2018   Procedure: COLONOSCOPY;  Surgeon: Daneil Dolin, MD;  [facility];  Service: Endoscopy;  Laterality: N/A;  2:00pm  . COLOSTOMY N/A 12/13/2017   Procedure: COLOSTOMY;  Surgeon: Donnie Mesa, MD;  Location: [facility];  Service: General;  Laterality: N/A;  . CORONARY ARTERY BYPASS GRAFT N/A 10/18/2015   Procedure: CORONARY ARTERY BYPASS GRAFTING (CABG) x  five, using left internal mammary artery and right leg greater saphenous vein harvested endoscopically;  Surgeon: [name], MD;  Location: [facility];  Service: Open Heart Surgery;  Laterality: N/A;  . ENDARTERECTOMY FEMORAL Right 08/09/2016   Procedure: ENDARTERECTOMY FEMORAL WITH VEIN PATCH ANGIOPLASTY;  Surgeon: Serafina Mitchell, MD;  [facility];  Service: Vascular;  Laterality: Right;  . ESOPHAGOGASTRODUODENOSCOPY N/A 10/24/2017   Dr. Gala Romney: hiatal hernia  . FEMORAL-FEMORAL BYPASS GRAFT Bilateral 08/09/2016   Procedure: REVISION BYPASS GRAFT RIGHT FEMORAL-LEFT FEMORAL ARTERY;  Surgeon: Serafina Mitchell, MD;  Location: [facility];  Service: Vascular;  Laterality: Bilateral;  . FEMORAL-POPLITEAL BYPASS GRAFT    . PERIPHERAL VASCULAR CATHETERIZATION N/A 05/08/2016   Procedure: Lower Extremity Angiography;  Surgeon: Lorretta Harp, MD;  Location: [facility];  Service: Cardiovascular;  Laterality: N/A;  . PERIPHERAL VASCULAR INTERVENTION Right 09/19/2016   Procedure:  Peripheral Vascular Intervention;  Surgeon: Serafina Mitchell, MD;  Location: [facility];  Service: Cardiovascular;  Laterality: Right;  ext iliac stent  . POLYPECTOMY  02/19/2018   Procedure: POLYPECTOMY;  Surgeon: Daneil Dolin, MD;  [facility];  Service: Endoscopy;;  . PROCTOSCOPY  10/24/2017   Procedure: PROCTOSCOPY;  Surgeon: Daneil Dolin, MD;  [facility];  Service: Endoscopy;;  . TEE WITHOUT CARDIOVERSION N/A 10/18/2015   Procedure: TRANSESOPHAGEAL ECHOCARDIOGRAM (TEE);  Surgeon: [name], MD;  Location: [facility];  Service: Open Heart Surgery;  Laterality: N/A;    MEDICATIONS: . albuterol (PROVENTIL HFA;VENTOLIN HFA) 108 (90 Base) MCG/ACT inhaler  . aspirin 81 MG tablet  . Cholecalciferol (VITAMIN D) 2000 UNITS tablet  . cilostazol (PLETAL) 100 MG tablet  . clopidogrel (PLAVIX) 75 MG tablet  . ferrous sulfate 325 (65 FE) MG tablet  . gabapentin (NEURONTIN) 300 MG capsule  . guaiFENesin (MUCINEX) 600 MG 12 hr tablet  . insulin  glargine (LANTUS) 100 UNIT/ML injection  . metoprolol tartrate (LOPRESSOR) 25 MG tablet  . metoprolol tartrate (LOPRESSOR) 25 MG tablet  . pantoprazole (PROTONIX) 40 MG tablet  . simvastatin (ZOCOR) 80 MG tablet  . vitamin B-12 (CYANOCOBALAMIN) 1000 MCG tablet   No current facility-administered medications for this encounter.    - Last dose ASA/plavix 03/21/18   If no changes, I anticipate pt can proceed with surgery as scheduled.   Willeen Cass, FNP-BC [facility]/Anesthesiology Phone: [phone] 03/21/2018 1:24 PM

## 2018-03-25 MED ORDER — SODIUM CHLORIDE 0.9 % IV SOLN
2.0000 g | INTRAVENOUS | Status: AC
  Administered 2018-03-26: 2 g via INTRAVENOUS
  Filled 2018-03-25: qty 2

## 2018-03-26 ENCOUNTER — Inpatient Hospital Stay (HOSPITAL_COMMUNITY): Payer: Medicare Other | Admitting: Emergency Medicine

## 2018-03-26 ENCOUNTER — Inpatient Hospital Stay (HOSPITAL_COMMUNITY)
Admission: RE | Disposition: A | Discharge status: Home / Self Care | Payer: Self-pay | Source: Ambulatory Visit | Attending: Surgery

## 2018-03-26 ENCOUNTER — Ambulatory Visit: Payer: Medicare Other | Admitting: Internal Medicine

## 2018-03-26 ENCOUNTER — Inpatient Hospital Stay (HOSPITAL_COMMUNITY)
Admission: RE | Admit: 2018-03-26 | Discharge: 2018-04-01 | DRG: 329 | Disposition: A | Discharge status: Home / Self Care | Payer: Medicare Other | Source: Ambulatory Visit | Attending: General Surgery | Admitting: General Surgery

## 2018-03-26 ENCOUNTER — Inpatient Hospital Stay (HOSPITAL_COMMUNITY): Payer: Medicare Other | Admitting: Certified Registered Nurse Anesthetist

## 2018-03-26 DIAGNOSIS — E119 Type 2 diabetes mellitus without complications: Secondary | ICD-10-CM

## 2018-03-26 DIAGNOSIS — K219 Gastro-esophageal reflux disease without esophagitis: Secondary | ICD-10-CM | POA: Diagnosis not present

## 2018-03-26 DIAGNOSIS — Z794 Long term (current) use of insulin: Secondary | ICD-10-CM

## 2018-03-26 DIAGNOSIS — Z7982 Long term (current) use of aspirin: Secondary | ICD-10-CM | POA: Diagnosis not present

## 2018-03-26 DIAGNOSIS — R55 Syncope and collapse: Secondary | ICD-10-CM | POA: Diagnosis not present

## 2018-03-26 DIAGNOSIS — G510 Bell's palsy: Secondary | ICD-10-CM | POA: Diagnosis not present

## 2018-03-26 DIAGNOSIS — D62 Acute posthemorrhagic anemia: Secondary | ICD-10-CM

## 2018-03-26 DIAGNOSIS — Z7902 Long term (current) use of antithrombotics/antiplatelets: Secondary | ICD-10-CM

## 2018-03-26 DIAGNOSIS — Z87891 Personal history of nicotine dependence: Secondary | ICD-10-CM | POA: Diagnosis not present

## 2018-03-26 DIAGNOSIS — Z79899 Other long term (current) drug therapy: Secondary | ICD-10-CM | POA: Diagnosis not present

## 2018-03-26 DIAGNOSIS — T8119XA Other postprocedural shock, initial encounter: Secondary | ICD-10-CM | POA: Diagnosis not present

## 2018-03-26 DIAGNOSIS — R9431 Abnormal electrocardiogram [ECG] [EKG]: Secondary | ICD-10-CM | POA: Clinically undetermined

## 2018-03-26 DIAGNOSIS — I129 Hypertensive chronic kidney disease with stage 1 through stage 4 chronic kidney disease, or unspecified chronic kidney disease: Secondary | ICD-10-CM | POA: Diagnosis not present

## 2018-03-26 DIAGNOSIS — E1122 Type 2 diabetes mellitus with diabetic chronic kidney disease: Secondary | ICD-10-CM | POA: Diagnosis present

## 2018-03-26 DIAGNOSIS — R748 Abnormal levels of other serum enzymes: Secondary | ICD-10-CM | POA: Diagnosis not present

## 2018-03-26 DIAGNOSIS — R7989 Other specified abnormal findings of blood chemistry: Secondary | ICD-10-CM | POA: Diagnosis not present

## 2018-03-26 DIAGNOSIS — G519 Disorder of facial nerve, unspecified: Secondary | ICD-10-CM | POA: Diagnosis not present

## 2018-03-26 DIAGNOSIS — I951 Orthostatic hypotension: Secondary | ICD-10-CM | POA: Diagnosis present

## 2018-03-26 DIAGNOSIS — R778 Other specified abnormalities of plasma proteins: Secondary | ICD-10-CM

## 2018-03-26 DIAGNOSIS — E876 Hypokalemia: Secondary | ICD-10-CM | POA: Diagnosis not present

## 2018-03-26 DIAGNOSIS — K91840 Postprocedural hemorrhage and hematoma of a digestive system organ or structure following a digestive system procedure: Secondary | ICD-10-CM | POA: Diagnosis not present

## 2018-03-26 DIAGNOSIS — E1140 Type 2 diabetes mellitus with diabetic neuropathy, unspecified: Secondary | ICD-10-CM | POA: Diagnosis not present

## 2018-03-26 DIAGNOSIS — E1151 Type 2 diabetes mellitus with diabetic peripheral angiopathy without gangrene: Secondary | ICD-10-CM | POA: Diagnosis present

## 2018-03-26 DIAGNOSIS — K5792 Diverticulitis of intestine, part unspecified, without perforation or abscess without bleeding: Secondary | ICD-10-CM | POA: Diagnosis present

## 2018-03-26 DIAGNOSIS — I959 Hypotension, unspecified: Secondary | ICD-10-CM | POA: Diagnosis not present

## 2018-03-26 DIAGNOSIS — I4581 Long QT syndrome: Secondary | ICD-10-CM | POA: Diagnosis not present

## 2018-03-26 DIAGNOSIS — N179 Acute kidney failure, unspecified: Secondary | ICD-10-CM | POA: Diagnosis not present

## 2018-03-26 DIAGNOSIS — G8918 Other acute postprocedural pain: Secondary | ICD-10-CM | POA: Diagnosis not present

## 2018-03-26 DIAGNOSIS — I2510 Atherosclerotic heart disease of native coronary artery without angina pectoris: Secondary | ICD-10-CM

## 2018-03-26 DIAGNOSIS — Z433 Encounter for attention to colostomy: Secondary | ICD-10-CM | POA: Diagnosis present

## 2018-03-26 DIAGNOSIS — I9581 Postprocedural hypotension: Secondary | ICD-10-CM

## 2018-03-26 DIAGNOSIS — Z951 Presence of aortocoronary bypass graft: Secondary | ICD-10-CM

## 2018-03-26 DIAGNOSIS — E785 Hyperlipidemia, unspecified: Secondary | ICD-10-CM | POA: Diagnosis present

## 2018-03-26 DIAGNOSIS — K5732 Diverticulitis of large intestine without perforation or abscess without bleeding: Secondary | ICD-10-CM | POA: Diagnosis not present

## 2018-03-26 DIAGNOSIS — Z952 Presence of prosthetic heart valve: Secondary | ICD-10-CM

## 2018-03-26 DIAGNOSIS — R569 Unspecified convulsions: Secondary | ICD-10-CM | POA: Diagnosis not present

## 2018-03-26 DIAGNOSIS — K660 Peritoneal adhesions (postprocedural) (postinfection): Secondary | ICD-10-CM | POA: Diagnosis not present

## 2018-03-26 DIAGNOSIS — Z9889 Other specified postprocedural states: Secondary | ICD-10-CM

## 2018-03-26 DIAGNOSIS — Z933 Colostomy status: Secondary | ICD-10-CM | POA: Diagnosis not present

## 2018-03-26 DIAGNOSIS — N183 Chronic kidney disease, stage 3 (moderate): Secondary | ICD-10-CM | POA: Diagnosis present

## 2018-03-26 DIAGNOSIS — K921 Melena: Secondary | ICD-10-CM | POA: Diagnosis not present

## 2018-03-26 HISTORY — PX: COLOSTOMY REVERSAL: SHX5782

## 2018-03-26 LAB — URINALYSIS, ROUTINE W REFLEX MICROSCOPIC
Bacteria, UA: NONE SEEN
Bilirubin Urine: NEGATIVE
KETONES UR: 20 mg/dL — AB
Nitrite: NEGATIVE
PH: 5 (ref 5.0–8.0)
Protein, ur: NEGATIVE mg/dL
SPECIFIC GRAVITY, URINE: 1.029 (ref 1.005–1.030)

## 2018-03-26 LAB — CBC
HCT: 30.2 % — ABNORMAL LOW (ref 39.0–52.0)
Hemoglobin: 9.6 g/dL — ABNORMAL LOW (ref 13.0–17.0)
MCH: 29.6 pg (ref 26.0–34.0)
MCHC: 31.8 g/dL (ref 30.0–36.0)
MCV: 93.2 fL (ref 78.0–100.0)
PLATELETS: 245 10*3/uL (ref 150–400)
RBC: 3.24 MIL/uL — ABNORMAL LOW (ref 4.22–5.81)
RDW: 14.6 % (ref 11.5–15.5)
WBC: 20.2 10*3/uL — ABNORMAL HIGH (ref 4.0–10.5)

## 2018-03-26 LAB — BRAIN NATRIURETIC PEPTIDE: B Natriuretic Peptide: 92 pg/mL (ref 0.0–100.0)

## 2018-03-26 LAB — COMPREHENSIVE METABOLIC PANEL
ALBUMIN: 2.5 g/dL — AB (ref 3.5–5.0)
ALT: 11 U/L (ref 0–44)
ANION GAP: 11 (ref 5–15)
AST: 15 U/L (ref 15–41)
Alkaline Phosphatase: 40 U/L (ref 38–126)
BUN: 18 mg/dL (ref 8–23)
CO2: 23 mmol/L (ref 22–32)
Calcium: 7.6 mg/dL — ABNORMAL LOW (ref 8.9–10.3)
Chloride: 106 mmol/L (ref 98–111)
Creatinine, Ser: 1.4 mg/dL — ABNORMAL HIGH (ref 0.61–1.24)
GFR calc Af Amer: 57 mL/min — ABNORMAL LOW (ref >60)
GFR calc non Af Amer: 49 mL/min — ABNORMAL LOW (ref >60)
Glucose, Bld: 229 mg/dL — ABNORMAL HIGH (ref 70–99)
POTASSIUM: 4.1 mmol/L (ref 3.5–5.1)
SODIUM: 140 mmol/L (ref 135–145)
TOTAL PROTEIN: 4.1 g/dL — AB (ref 6.5–8.1)
Total Bilirubin: 0.7 mg/dL (ref 0.3–1.2)

## 2018-03-26 LAB — GLUCOSE, CAPILLARY
GLUCOSE-CAPILLARY: 189 mg/dL — AB (ref 70–99)
GLUCOSE-CAPILLARY: 222 mg/dL — AB (ref 70–99)
Glucose-Capillary: 331 mg/dL — ABNORMAL HIGH (ref 70–99)
Glucose-Capillary: 293 mg/dL — ABNORMAL HIGH (ref 70–99)
Glucose-Capillary: 239 mg/dL — ABNORMAL HIGH (ref 70–99)
Glucose-Capillary: 208 mg/dL — ABNORMAL HIGH (ref 70–99)

## 2018-03-26 LAB — PREPARE RBC (CROSSMATCH)

## 2018-03-26 LAB — POCT I-STAT 4, (NA,K, GLUC, HGB,HCT)
GLUCOSE: 197 mg/dL — AB (ref 70–99)
HCT: 33 % — ABNORMAL LOW (ref 39.0–52.0)
HEMOGLOBIN: 11.2 g/dL — AB (ref 13.0–17.0)
Potassium: 3.9 mmol/L (ref 3.5–5.1)
Sodium: 140 mmol/L (ref 135–145)

## 2018-03-26 LAB — TROPONIN I

## 2018-03-26 LAB — MAGNESIUM: MAGNESIUM: 1.3 mg/dL — AB (ref 1.7–2.4)

## 2018-03-26 LAB — PROTIME-INR
INR: 1.21
Prothrombin Time: 15.2 seconds (ref 11.4–15.2)

## 2018-03-26 LAB — MRSA PCR SCREENING: MRSA by PCR: NEGATIVE

## 2018-03-26 SURGERY — COLOSTOMY REVERSAL
Anesthesia: General | Site: Abdomen

## 2018-03-26 MED ORDER — SUGAMMADEX SODIUM 200 MG/2ML IV SOLN
INTRAVENOUS | Status: DC | PRN
  Administered 2018-03-26: 200 mg via INTRAVENOUS

## 2018-03-26 MED ORDER — SODIUM CHLORIDE 0.9 % IV SOLN
2.0000 g | Freq: Two times a day (BID) | INTRAVENOUS | Status: AC
  Administered 2018-03-26: 2 g via INTRAVENOUS
  Filled 2018-03-26 (×2): qty 2

## 2018-03-26 MED ORDER — SURGILUBE EX GEL
CUTANEOUS | Status: DC | PRN
  Administered 2018-03-26: 1 via TOPICAL

## 2018-03-26 MED ORDER — ALVIMOPAN 12 MG PO CAPS: 12.0000 mg | ORAL_CAPSULE | Freq: Two times a day (BID) | ORAL | Status: DC

## 2018-03-26 MED ORDER — ONDANSETRON HCL 4 MG/2ML IJ SOLN
INTRAMUSCULAR | Status: DC | PRN
  Administered 2018-03-26: 4 mg via INTRAVENOUS

## 2018-03-26 MED ORDER — GABAPENTIN 300 MG PO CAPS
300.0000 mg | ORAL_CAPSULE | ORAL | Status: AC
  Administered 2018-03-26: 300 mg via ORAL

## 2018-03-26 MED ORDER — PANTOPRAZOLE SODIUM 40 MG PO TBEC
40.0000 mg | DELAYED_RELEASE_TABLET | Freq: Every day | ORAL | Status: DC
  Administered 2018-03-27 – 2018-04-01 (×6): 40 mg via ORAL
  Filled 2018-03-26 (×6): qty 1

## 2018-03-26 MED ORDER — ATORVASTATIN CALCIUM 40 MG PO TABS
40.0000 mg | ORAL_TABLET | Freq: Every day | ORAL | Status: DC
  Administered 2018-03-27 – 2018-03-31 (×5): 40 mg via ORAL
  Filled 2018-03-26 (×6): qty 1

## 2018-03-26 MED ORDER — 0.9 % SODIUM CHLORIDE (POUR BTL) OPTIME
TOPICAL | Status: DC | PRN
  Administered 2018-03-26 (×4): 1000 mL

## 2018-03-26 MED ORDER — ALVIMOPAN 12 MG PO CAPS
12.0000 mg | ORAL_CAPSULE | ORAL | Status: AC
  Administered 2018-03-26: 12 mg via ORAL

## 2018-03-26 MED ORDER — KETOROLAC TROMETHAMINE 15 MG/ML IJ SOLN
15.0000 mg | Freq: Four times a day (QID) | INTRAMUSCULAR | Status: DC
  Administered 2018-03-26 – 2018-03-27 (×2): 15 mg via INTRAVENOUS
  Filled 2018-03-26 (×3): qty 1

## 2018-03-26 MED ORDER — SIMETHICONE 80 MG PO CHEW
40.0000 mg | CHEWABLE_TABLET | Freq: Four times a day (QID) | ORAL | Status: DC | PRN
  Filled 2018-03-26: qty 1

## 2018-03-26 MED ORDER — KETOROLAC TROMETHAMINE 15 MG/ML IJ SOLN
INTRAMUSCULAR | Status: AC
  Filled 2018-03-26: qty 1

## 2018-03-26 MED ORDER — ONDANSETRON HCL 4 MG/2ML IJ SOLN: 4.0000 mg | Freq: Four times a day (QID) | INTRAMUSCULAR | Status: DC | PRN

## 2018-03-26 MED ORDER — LIDOCAINE IN D5W 4-5 MG/ML-% IV SOLN
1.0000 mg/min | INTRAVENOUS | Status: AC
  Administered 2018-03-26: 25 ug/kg/min via INTRAVENOUS
  Filled 2018-03-26: qty 500

## 2018-03-26 MED ORDER — ALVIMOPAN 12 MG PO CAPS
ORAL_CAPSULE | ORAL | Status: AC
  Filled 2018-03-26: qty 1

## 2018-03-26 MED ORDER — SODIUM CHLORIDE 0.9% IV SOLUTION
Freq: Once | INTRAVENOUS | Status: AC
  Administered 2018-03-26: 20:00:00 via INTRAVENOUS

## 2018-03-26 MED ORDER — ROCURONIUM BROMIDE 10 MG/ML (PF) SYRINGE
PREFILLED_SYRINGE | INTRAVENOUS | Status: DC | PRN
  Administered 2018-03-26: 10 mg via INTRAVENOUS
  Administered 2018-03-26: 50 mg via INTRAVENOUS

## 2018-03-26 MED ORDER — BUPIVACAINE LIPOSOME 1.3 % IJ SUSP
20.0000 mL | INTRAMUSCULAR | Status: AC
  Administered 2018-03-26: 20 mL
  Filled 2018-03-26: qty 20

## 2018-03-26 MED ORDER — SODIUM CHLORIDE 0.9 % IJ SOLN
INTRAMUSCULAR | Status: DC | PRN
  Administered 2018-03-26 (×2): 10 mL

## 2018-03-26 MED ORDER — METOPROLOL TARTRATE 25 MG PO TABS
25.0000 mg | ORAL_TABLET | Freq: Two times a day (BID) | ORAL | Status: DC
  Administered 2018-03-27 – 2018-04-01 (×11): 25 mg via ORAL
  Filled 2018-03-26: qty 2
  Filled 2018-03-26: qty 1
  Filled 2018-03-26 (×2): qty 2
  Filled 2018-03-26 (×2): qty 1
  Filled 2018-03-26 (×2): qty 2
  Filled 2018-03-26: qty 1
  Filled 2018-03-26 (×2): qty 2

## 2018-03-26 MED ORDER — METFORMIN HCL 500 MG PO TABS
500.0000 mg | ORAL_TABLET | Freq: Two times a day (BID) | ORAL | Status: DC
  Filled 2018-03-26: qty 1

## 2018-03-26 MED ORDER — HYDROMORPHONE HCL 1 MG/ML IJ SOLN
INTRAMUSCULAR | Status: AC
  Administered 2018-03-26: 0.5 mg via INTRAVENOUS
  Filled 2018-03-26: qty 1

## 2018-03-26 MED ORDER — ENOXAPARIN SODIUM 40 MG/0.4ML ~~LOC~~ SOLN: 40.0000 mg | SUBCUTANEOUS | Status: DC

## 2018-03-26 MED ORDER — MIDAZOLAM HCL 2 MG/2ML IJ SOLN
INTRAMUSCULAR | Status: DC | PRN
  Administered 2018-03-26 (×2): 1 mg via INTRAVENOUS

## 2018-03-26 MED ORDER — PHENYLEPHRINE 40 MCG/ML (10ML) SYRINGE FOR IV PUSH (FOR BLOOD PRESSURE SUPPORT)
80.0000 ug | PREFILLED_SYRINGE | Freq: Once | INTRAVENOUS | Status: DC | PRN
  Filled 2018-03-26 (×2): qty 5

## 2018-03-26 MED ORDER — HYDROMORPHONE HCL 1 MG/ML IJ SOLN
0.2500 mg | INTRAMUSCULAR | Status: DC | PRN
  Administered 2018-03-26: 0.5 mg via INTRAVENOUS

## 2018-03-26 MED ORDER — DIPHENHYDRAMINE HCL 50 MG/ML IJ SOLN: 12.5000 mg | Freq: Four times a day (QID) | INTRAMUSCULAR | Status: DC | PRN

## 2018-03-26 MED ORDER — HYDROCODONE-ACETAMINOPHEN 5-325 MG PO TABS: 1.0000 | ORAL_TABLET | ORAL | Status: DC | PRN

## 2018-03-26 MED ORDER — CHLORHEXIDINE GLUCONATE CLOTH 2 % EX PADS: 6.0000 | MEDICATED_PAD | Freq: Once | CUTANEOUS | Status: DC

## 2018-03-26 MED ORDER — ACETAMINOPHEN 500 MG PO TABS
ORAL_TABLET | ORAL | Status: AC
  Filled 2018-03-26: qty 2

## 2018-03-26 MED ORDER — POTASSIUM CHLORIDE IN NACL 20-0.9 MEQ/L-% IV SOLN
INTRAVENOUS | Status: DC
  Administered 2018-03-26 – 2018-03-27 (×2): via INTRAVENOUS
  Filled 2018-03-26 (×3): qty 1000

## 2018-03-26 MED ORDER — TRAMADOL HCL 50 MG PO TABS
50.0000 mg | ORAL_TABLET | Freq: Four times a day (QID) | ORAL | Status: DC | PRN
  Administered 2018-03-27: 50 mg via ORAL
  Filled 2018-03-26: qty 1

## 2018-03-26 MED ORDER — FERROUS SULFATE 325 (65 FE) MG PO TABS
325.0000 mg | ORAL_TABLET | Freq: Two times a day (BID) | ORAL | Status: DC
  Administered 2018-03-27 – 2018-04-01 (×11): 325 mg via ORAL
  Filled 2018-03-26 (×12): qty 1

## 2018-03-26 MED ORDER — SODIUM CHLORIDE 0.9 % IV SOLN
1.0000 g | Freq: Once | INTRAVENOUS | Status: AC
  Administered 2018-03-26: 1 g via INTRAVENOUS
  Filled 2018-03-26: qty 10

## 2018-03-26 MED ORDER — INSULIN ASPART 100 UNIT/ML ~~LOC~~ SOLN
0.0000 [IU] | Freq: Three times a day (TID) | SUBCUTANEOUS | Status: DC
  Administered 2018-03-27: 3 [IU] via SUBCUTANEOUS

## 2018-03-26 MED ORDER — LACTATED RINGERS IV SOLN
INTRAVENOUS | Status: DC | PRN
  Administered 2018-03-26 (×2): via INTRAVENOUS

## 2018-03-26 MED ORDER — ONDANSETRON HCL 4 MG/2ML IJ SOLN
INTRAMUSCULAR | Status: AC
  Filled 2018-03-26: qty 2

## 2018-03-26 MED ORDER — ACETAMINOPHEN 500 MG PO TABS
1000.0000 mg | ORAL_TABLET | ORAL | Status: AC
  Administered 2018-03-26: 1000 mg via ORAL

## 2018-03-26 MED ORDER — EPHEDRINE SULFATE-NACL 50-0.9 MG/10ML-% IV SOSY
PREFILLED_SYRINGE | INTRAVENOUS | Status: DC | PRN
  Administered 2018-03-26: 5 mg via INTRAVENOUS

## 2018-03-26 MED ORDER — MAGNESIUM SULFATE 2 GM/50ML IV SOLN
2.0000 g | Freq: Once | INTRAVENOUS | Status: AC
  Administered 2018-03-26: 2 g via INTRAVENOUS
  Filled 2018-03-26: qty 50

## 2018-03-26 MED ORDER — MORPHINE SULFATE (PF) 2 MG/ML IV SOLN: 2.0000 mg | INTRAVENOUS | Status: DC | PRN

## 2018-03-26 MED ORDER — GABAPENTIN 300 MG PO CAPS
ORAL_CAPSULE | ORAL | Status: AC
  Filled 2018-03-26: qty 1

## 2018-03-26 MED ORDER — PROPOFOL 10 MG/ML IV BOLUS
INTRAVENOUS | Status: DC | PRN
  Administered 2018-03-26: 150 mg via INTRAVENOUS

## 2018-03-26 MED ORDER — CILOSTAZOL 100 MG PO TABS: 100.0000 mg | ORAL_TABLET | Freq: Two times a day (BID) | ORAL | Status: DC

## 2018-03-26 MED ORDER — ACETAMINOPHEN 500 MG PO TABS
1000.0000 mg | ORAL_TABLET | Freq: Four times a day (QID) | ORAL | Status: DC
  Administered 2018-03-27 – 2018-04-01 (×16): 1000 mg via ORAL
  Filled 2018-03-26 (×17): qty 2

## 2018-03-26 MED ORDER — DIPHENHYDRAMINE HCL 12.5 MG/5ML PO ELIX
12.5000 mg | ORAL_SOLUTION | Freq: Four times a day (QID) | ORAL | Status: DC | PRN
  Filled 2018-03-26: qty 5

## 2018-03-26 MED ORDER — SODIUM CHLORIDE 0.9% IV SOLUTION
Freq: Once | INTRAVENOUS | Status: AC
  Administered 2018-03-26: 22:00:00 via INTRAVENOUS

## 2018-03-26 MED ORDER — MIDAZOLAM HCL 2 MG/2ML IJ SOLN
INTRAMUSCULAR | Status: AC
  Filled 2018-03-26: qty 2

## 2018-03-26 MED ORDER — LIDOCAINE 2% (20 MG/ML) 5 ML SYRINGE
INTRAMUSCULAR | Status: DC | PRN
  Administered 2018-03-26: 60 mg via INTRAVENOUS

## 2018-03-26 MED ORDER — METOPROLOL TARTRATE 12.5 MG HALF TABLET
25.0000 mg | ORAL_TABLET | Freq: Once | ORAL | Status: AC
  Administered 2018-03-26: 25 mg via ORAL

## 2018-03-26 MED ORDER — GABAPENTIN 300 MG PO CAPS
300.0000 mg | ORAL_CAPSULE | Freq: Three times a day (TID) | ORAL | Status: DC
  Administered 2018-03-27 – 2018-04-01 (×16): 300 mg via ORAL
  Filled 2018-03-26 (×17): qty 1

## 2018-03-26 MED ORDER — ALBUTEROL SULFATE (2.5 MG/3ML) 0.083% IN NEBU: 2.5000 mg | INHALATION_SOLUTION | Freq: Four times a day (QID) | RESPIRATORY_TRACT | Status: DC | PRN

## 2018-03-26 MED ORDER — PHENYLEPHRINE HCL 10 MG/ML IJ SOLN
INTRAMUSCULAR | Status: DC | PRN
  Administered 2018-03-26: 20 ug/min via INTRAVENOUS

## 2018-03-26 MED ORDER — ONDANSETRON 4 MG PO TBDP
4.0000 mg | ORAL_TABLET | Freq: Four times a day (QID) | ORAL | Status: DC | PRN
  Filled 2018-03-26: qty 1

## 2018-03-26 MED ORDER — FENTANYL CITRATE (PF) 250 MCG/5ML IJ SOLN
INTRAMUSCULAR | Status: AC
  Filled 2018-03-26: qty 5

## 2018-03-26 MED ORDER — FENTANYL CITRATE (PF) 250 MCG/5ML IJ SOLN
INTRAMUSCULAR | Status: DC | PRN
  Administered 2018-03-26: 50 ug via INTRAVENOUS
  Administered 2018-03-26: 100 ug via INTRAVENOUS

## 2018-03-26 MED ORDER — METOPROLOL TARTRATE 12.5 MG HALF TABLET
ORAL_TABLET | ORAL | Status: AC
  Filled 2018-03-26: qty 2

## 2018-03-26 MED ORDER — KETOROLAC TROMETHAMINE 15 MG/ML IJ SOLN
15.0000 mg | INTRAMUSCULAR | Status: AC
  Administered 2018-03-26: 15 mg via INTRAVENOUS

## 2018-03-26 SURGICAL SUPPLY — 50 items
BLADE CLIPPER SURG (BLADE) ×2 IMPLANT
COVER SURGICAL LIGHT HANDLE (MISCELLANEOUS) ×6 IMPLANT
DRSG OPSITE POSTOP 4X12 (GAUZE/BANDAGES/DRESSINGS) ×2 IMPLANT
DRSG TEGADERM 4X4.75 (GAUZE/BANDAGES/DRESSINGS) ×2 IMPLANT
ELECT CAUTERY BLADE 6.4 (BLADE) ×6 IMPLANT
ELECT REM PT RETURN 9FT ADLT (ELECTROSURGICAL) ×3
ELECTRODE REM PT RTRN 9FT ADLT (ELECTROSURGICAL) ×1 IMPLANT
GAUZE SPONGE 4X4 12PLY STRL (GAUZE/BANDAGES/DRESSINGS) ×2 IMPLANT
GEL ULTRASOUND 20GR AQUASONIC (MISCELLANEOUS) IMPLANT
GLOVE BIO SURGEON STRL SZ7 (GLOVE) ×6 IMPLANT
GLOVE BIOGEL PI IND STRL 6.5 (GLOVE) IMPLANT
GLOVE BIOGEL PI IND STRL 7.5 (GLOVE) ×2 IMPLANT
GLOVE BIOGEL PI INDICATOR 6.5 (GLOVE) ×4
GLOVE BIOGEL PI INDICATOR 7.5 (GLOVE) ×4
GLOVE EUDERMIC 7 POWDERFREE (GLOVE) ×4 IMPLANT
GLOVE SURG SS PI 6.0 STRL IVOR (GLOVE) ×4 IMPLANT
GLOVE SURG SS PI 6.5 STRL IVOR (GLOVE) ×4 IMPLANT
GOWN STRL REUS W/ TWL LRG LVL3 (GOWN DISPOSABLE) ×4 IMPLANT
GOWN STRL REUS W/ TWL XL LVL3 (GOWN DISPOSABLE) IMPLANT
GOWN STRL REUS W/TWL LRG LVL3 (GOWN DISPOSABLE) ×12
GOWN STRL REUS W/TWL XL LVL3 (GOWN DISPOSABLE) ×6
KIT TURNOVER KIT B (KITS) ×3 IMPLANT
LIGASURE IMPACT 36 18CM CVD LR (INSTRUMENTS) ×2 IMPLANT
NS IRRIG 1000ML POUR BTL (IV SOLUTION) ×6 IMPLANT
PACK COLON (CUSTOM PROCEDURE TRAY) ×3 IMPLANT
PAD ARMBOARD 7.5X6 YLW CONV (MISCELLANEOUS) ×3 IMPLANT
PENCIL BUTTON HOLSTER BLD 10FT (ELECTRODE) ×3 IMPLANT
SOLUTION BETADINE 4OZ (MISCELLANEOUS) ×2 IMPLANT
SPECIMEN JAR MEDIUM (MISCELLANEOUS) ×3 IMPLANT
SPONGE LAP 18X18 X RAY DECT (DISPOSABLE) ×2 IMPLANT
SPRAY BETADINE AEROSOL XXX (MISCELLANEOUS) ×2 IMPLANT
STAPLER AUT SUT 4.8 EEAXL 31 (STAPLE) ×2 IMPLANT
STAPLER VISISTAT 35W (STAPLE) ×3 IMPLANT
SURGILUBE 2OZ TUBE FLIPTOP (MISCELLANEOUS) IMPLANT
SUT NOVA 1 T20/GS 25DT (SUTURE) ×2 IMPLANT
SUT PDS AB 1 TP1 96 (SUTURE) ×6 IMPLANT
SUT PROLENE 2 0 CT2 30 (SUTURE) ×2 IMPLANT
SUT PROLENE 2 0 KS (SUTURE) ×2 IMPLANT
SUT SILK 2 0 SH CR/8 (SUTURE) ×3 IMPLANT
SUT SILK 2 0 TIES 10X30 (SUTURE) ×3 IMPLANT
SUT SILK 3 0 SH CR/8 (SUTURE) ×5 IMPLANT
SUT SILK 3 0 TIES 10X30 (SUTURE) ×3 IMPLANT
TOWEL GREEN STERILE FF (TOWEL DISPOSABLE) ×2 IMPLANT
TRAY FOLEY MTR SLVR 16FR STAT (SET/KITS/TRAYS/PACK) ×3 IMPLANT
TRAY PROCTOSCOPIC FIBER OPTIC (SET/KITS/TRAYS/PACK) ×2 IMPLANT
TUBE CONNECTING 12'X1/4 (SUCTIONS) ×2
TUBE CONNECTING 12X1/4 (SUCTIONS) ×4 IMPLANT
TUBE CONNECTING 20'X1/4 (TUBING) ×1
TUBE CONNECTING 20X1/4 (TUBING) ×1 IMPLANT
YANKAUER SUCT BULB TIP NO VENT (SUCTIONS) ×2 IMPLANT

## 2018-03-26 NOTE — Progress Notes (Signed)
Chart correction complete at 1413. I was in with [facility] while pre-op assessment was completed. Patient stated that carb drink was at 0700

## 2018-03-26 NOTE — Consult Note (Signed)
PULMONARY / CRITICAL CARE MEDICINE   Name: [name] MRN: [id number] DOB: May 31, 1946    ADMISSION DATE:  03/26/2018 CONSULTATION DATE:  03/26/2018  REFERRING MD:  Dr. Georgette Dover  CHIEF COMPLAINT:  Hypotension  HISTORY OF PRESENT ILLNESS:   72 year old male with PMH significant for but not limited to CAD/ CABG in 2017 on plavix and cilostazol , HTN, PVD, CKD, DM, neuropathy, GERD, HLD, and previous history of perforated sigmoid diverticulitis s/p Hartmann's procedure on 5/2 with benign surgical pathology. He has done well post-operatively.   Presented back for colostomy reversal and surgery went remarkably well.  A sigmoidoscopy was performed at the end of surgery to verify no bleeding at the staple line.  Patient states he has held his two antiplatelet meds for five days prior to surgery.  In PACU, patient began passing some clot and melena with istat Hgb 11.2; preop was 16.1.  Upon arrival to surgical floor, he had an episode where he became nauseated with probable vagal/ syncopal episode where he became unresponsive with some question of seizure activity due to shaking in his arms, however patient woke up and was not post-ictal.  He was hypotensive and therefore transferred to ICU.  EKG showing normal sinus rhythm with prolonged QTc at 0.489.  CCS at bedside to pack gel-foam into the rectum up to the anastomosis and 2 pack of platelets to be transfused ordered.  PCCM consulted for further medical assistance.    PAST MEDICAL HISTORY :  He  has a past medical history of CAD S/P percutaneous coronary angioplasty (1998), CKD (chronic kidney disease) stage 3, GFR 30-59 ml/min ([place]), Diabetes mellitus without complication ([place]), Diverticulitis (08/16/2017), GERD (gastroesophageal reflux disease), CABG (March 2017), Hypercholesteremia, Hypertension, Neuropathy, Peripheral vascular disease ([place]), Pneumonia, and Tachycardia.  PAST SURGICAL HISTORY: He  has a past surgical history that includes Cardiac  catheterization (2003); Femoral-popliteal Bypass Graft; Colonoscopy (N/A, 09/22/2013); Cardiac catheterization (N/A, 10/11/2015); Coronary artery bypass graft (N/A, 10/18/2015); TEE without cardioversion (N/A, 10/18/2015); Cardiac catheterization (N/A, 05/08/2016); Femoral-femoral Bypass Graft (Bilateral, 08/09/2016); Endarterectomy femoral (Right, 08/09/2016); ABDOMINAL AORTOGRAM (Bilateral, 09/19/2016); PERIPHERAL VASCULAR INTERVENTION (Right, 09/19/2016); Esophagogastroduodenoscopy (N/A, 10/24/2017); Proctoscopy (10/24/2017); Colon resection (N/A, 12/13/2017); Colostomy (N/A, 12/13/2017); Colonoscopy (N/A, 02/19/2018); and polypectomy (02/19/2018).  No Known Allergies  No current facility-administered medications on file prior to encounter.    Current Outpatient Medications on File Prior to Encounter  Medication Sig  . aspirin 81 MG tablet Take 81 mg by mouth daily.  . cilostazol (PLETAL) 100 MG tablet Take 100 mg by mouth 2 (two) times daily.  . clopidogrel (PLAVIX) 75 MG tablet Take 75 mg by mouth daily.  . ferrous sulfate 325 (65 FE) MG tablet Take 325 mg by mouth 2 (two) times daily with a meal.   . gabapentin (NEURONTIN) 300 MG capsule Take 300 mg by mouth 3 (three) times daily.   . insulin glargine (LANTUS) 100 UNIT/ML injection Inject 0.2 mLs (20 Units total) into the skin at bedtime. (Patient taking differently: Inject 25 Units into the skin at bedtime. )  . metFORMIN (GLUCOPHAGE) 500 MG tablet Take 500 mg by mouth 2 (two) times daily with a meal.  . metoprolol tartrate (LOPRESSOR) 25 MG tablet Take 25 mg by mouth 2 (two) times daily.  . pantoprazole (PROTONIX) 40 MG tablet Take 1 tablet (40 mg total) by mouth daily.  . simvastatin (ZOCOR) 80 MG tablet Take 40 mg by mouth at bedtime.   [name] albuterol (PROVENTIL HFA;VENTOLIN HFA) 108 (90 Base) MCG/ACT inhaler  Inhale 2 puffs into the lungs every 6 (six) hours as needed for wheezing or shortness of breath.  . Cholecalciferol (VITAMIN D) 2000 UNITS tablet Take  2,000 Units by mouth daily.  [name] guaiFENesin (MUCINEX) 600 MG 12 hr tablet Take 1 tablet (600 mg total) by mouth 2 (two) times daily. (Patient not taking: Reported on 02/12/2018)  . metoprolol tartrate (LOPRESSOR) 25 MG tablet TAKE 1 TABLET BY MOUTH TWICE DAILY  . vitamin B-12 (CYANOCOBALAMIN) 1000 MCG tablet Take 1,000 mcg by mouth daily.    FAMILY HISTORY:  His family history includes Colon cancer in his brother; Heart attack in his mother; Stroke in his mother.  SOCIAL HISTORY: He  reports that he quit smoking about 17 years ago. His smoking use included cigarettes. He has a 60.00 pack-year smoking history. He has never used smokeless tobacco. He reports that he does not drink alcohol or use drugs.  REVIEW OF SYSTEMS:  POSITIVES IN BOLD Gen: Denies fever, chills, weight change, fatigue, night sweats HEENT: Denies vision changes, sore throat, dysphagia PULM: Denies shortness of breath, cough, wheezing CV: Denies chest pain, edema, orthopnea, paroxysmal nocturnal dyspnea, palpitations GI: Denies abdominal pain, nausea, vomiting, diarrhea, melena GU: Denies dysuria, hematuria Derm: Denies rash, or skin change Heme: Denies easy bruising, bleeding Neuro: Denies headache, chronic peripheral numbness, weakness, slurred speech, loss of consciousness  SUBJECTIVE:  Complaints of mild abd pain/ tenderness  VITAL SIGNS: BP 133/66   Pulse 90   Temp 97.7 F (36.5 C)   Resp 20   Ht 5\' 7"  (1.702 m)   Wt 69 kg   SpO2 95%   BMI 23.84 kg/m   HEMODYNAMICS:    VENTILATOR SETTINGS:    INTAKE / OUTPUT: I/O last 3 completed shifts: In: 1000 [I.V.:1000] Out: 509 [Urine:475; Stool:9; Blood:25]  PHYSICAL EXAMINATION: General:  Well nourished elderly male lying in bed in NAD HEENT: MM pink/moist, pupils 3/ round/reactive, no JVD, anicteric  Neuro: Awake, alert, oriented x 3, non focal, MAE CV:  rrr, no m/r/g PULM: even/non-labored, lungs bilaterally clear, currently on 2L at 98-99% KD:TOIZ,  mild tenderness, bs+, midline surgical incision with dressing intact, lower portion of previous bleeding not extending beyond peripheral markings  Extremities: cool/dry, no edema  Skin: no rashes or lesions  LABS:  BMET Recent Labs  Lab 03/20/18 1037 03/26/18 1617  NA 138 140  K 3.4* 3.9  CL 96*  --   CO2 29  --   BUN 16  --   CREATININE 1.30*  --   GLUCOSE 154* 197*    Electrolytes Recent Labs  Lab 03/20/18 1037  CALCIUM 9.1    CBC Recent Labs  Lab 03/20/18 1037 03/26/18 1617  WBC 7.7  --   HGB 16.1 11.2*  HCT 48.8 33.0*  PLT 262  --     Coag's No results for input(s): APTT, INR in the last 168 hours.  Sepsis Markers No results for input(s): LATICACIDVEN, PROCALCITON, O2SATVEN in the last 168 hours.  ABG No results for input(s): PHART, PCO2ART, PO2ART in the last 168 hours.  Liver Enzymes No results for input(s): AST, ALT, ALKPHOS, BILITOT, ALBUMIN in the last 168 hours.  Cardiac Enzymes No results for input(s): TROPONINI, PROBNP in the last 168 hours.  Glucose Recent Labs  Lab 03/20/18 1000 03/26/18 0814 03/26/18 0908 03/26/18 1022 03/26/18 1309 03/26/18 1834  GLUCAP 166* 331* 293* 239* 189* 222*    Imaging No results found.  STUDIES:   none  CULTURES: MRSA PCR 8/13 >>  8/13 BC x 2 >>  ANTIBIOTICS: 8/13 cefotetan - pre-op  SIGNIFICANT EVENTS: 5/2 Hartmann's procedure for perforated sigmoid diverticulitis  8/13 Reversal colostomy   LINES/TUBES: 2 PIVs , 18g LAC, 20g RAC  DISCUSSION: 87 yoM with previous CAD/ CABG 2017 on DAPT (unclear if this was held), who underwent reversal of his Hartmann's procedure (performed 5/2).  Developed some rectal clots and melena in PACU with istat Hgb 11.2 and on arrival to floor had hypotension with vagal/ syncopal episodes.  Given 1L LR and transferred to ICU, on arrival was alert and normotensive.  CCS at bedside s/p packing gel-foam into the rectum up to the anastomosis.    While in ICU, patient  passed several more clots, BP dropped to SBP 60's and became diaphoretic.  2nd liter infusing.  ASSESSMENT / PLAN:  Hypotension- related to ABLA from rectal bleeding and previous DAPT (stopped per patient 5 days prior to surgery) after reversal of colostomy  - s/p rectal packing of gel- foam by CCS - s/p 1L LR with improvement of hemodynamics, and then hypotensive again with passive of clots P:  CBC in process  To be transfused 2 pack of platelets  PRBC ready in blood bank Additional 2nd liter NS now neosynephrine on standby, but thus far has been responsive to fluids;  fluids and blood products would be first treatment prior to vasopressors Goal MAP > 65 PIV- 18g/ 20g access sufficient for now Check cortisol, send for blood cultures (not febrile), UA,   Reversal of Hartmann's procedure from 5/2 after perforated sigmoid diveriticultis/ benign surgical path - uncomplicated surgery - sigmoidostomy in OR without evidence of bleeding - s/p rectal gel-foam packing per CCS 8/13 P:  Per CCS Monitor Treat symptomatically for now Goal K > 4/ Mag > 2 Monitor QTc with antinausea meds   Altered mental status/ syncope - now resolved, in the setting of hypoperfusion/hypotension.  Some question of shaking of arms, however patient awoke, not post-ictal, and non-focal  P:  Monitor/ neuro checks Goal MAP > 65 Hold on head CT  CKD At risk for AKI given hypotension - has foley w/ CYU output - s/p toradol in PACU P:  CMET and mag pending Trend BMP / mag/ phos/ daily wt/ urinary output Replace electrolytes as indicated  CAD with prior CABG 2017 on plavix and pletal  PVD, HTN, HLD P:  Hold all anticoagulants/ antiplatelets for now EKG with sinus/ prolonged QTc 0.489 Hold lopressor and zocor  Assess troponin and BNP  DM P:  CBG q4 SSI  FAMILY  - Updates: Patient and wife updated at bedside.   - Inter-disciplinary family meet or Palliative Care meeting due by:  8/20  CCT 55  mins  Kennieth Rad, AGACNP-BC [facility] Pgr: [phone] or if no answer [phone] 03/26/2018, 8:25 PM

## 2018-03-26 NOTE — Progress Notes (Signed)
Patient arrived to unit. Upon arrival patient had an episode of unresponsiveness and code was called. Patient did have a pulse and chest compressions were not required. Rapid response and doctors notified of event. Patient passed a stool that was bloody with clots. Vitals were taken and documented. New orders will be implemented.

## 2018-03-26 NOTE — Progress Notes (Signed)
Dr. Nyoka Cowden notified of repeat CBG of 293 no further orders.

## 2018-03-26 NOTE — Transfer of Care (Signed)
Immediate Anesthesia Transfer of Care Note  Patient: LEANDER TOUT  Procedure(s) Performed: COLOSTOMY REVERSAL (N/A Abdomen)  Patient Location: PACU  Anesthesia Type:General  Level of Consciousness: drowsy  Airway & Oxygen Therapy: Patient Spontanous Breathing and Patient connected to nasal cannula oxygen  Post-op Assessment: Report given to RN and Post -op Vital signs reviewed and stable  Post vital signs: Reviewed and stable  Last Vitals:  Vitals Value Taken Time  BP 180/97 03/26/2018  1:06 PM  Temp 36.5 C 03/26/2018  1:06 PM  Pulse 63 03/26/2018  1:13 PM  Resp 7 03/26/2018  1:13 PM  SpO2 99 % 03/26/2018  1:13 PM  Vitals shown include unvalidated device data.  Last Pain:  Vitals:   03/26/18 1306  TempSrc:   PainSc: 0-No pain         Complications: No apparent anesthesia complications

## 2018-03-26 NOTE — Progress Notes (Addendum)
Pt was transported to 6N12, when arrived into room pt stated "I don't feel good" c/o nausea. Pt became unresponsive pale and had some seizure like activity, shaking of arms appeared to be posturing. HR on monitor sinus tach 122. Episode lasted less than 2 mins. BP was taken and first BP was 164H systolic. BP then lowered gradually.Pt became responsive after a short period of time and A&Ox4. Pt started having another bloody BM with large clots and then became hypotensive in the 53P systolic. 588ml bolus of fluid given on unit but BP increased within first 231ml. Dr. Georgette Dover and Rosendo Gros came to bedside.

## 2018-03-26 NOTE — Progress Notes (Signed)
Report was called to [place], RN on 31M prior to patient's transfer.

## 2018-03-26 NOTE — Progress Notes (Addendum)
Pt had another hypotensive episode. Pt BP increased on its own. 1L of fluid was administered per Dr. Ermalene Postin. Dr. Georgette Dover aware and is still ok with pt going to 6N but with tele. Anesthesia aware and ok with pt being transported to room.

## 2018-03-26 NOTE — Progress Notes (Signed)
Nursing note:  Spoke with the PACU nurse who was checking the VS for the patient at the time of his hypotensive episode.  The patient went from 33 systolically to 22/56H with a HR in the 80s. BP did go back up to 138/78. Rapid at the bedside.

## 2018-03-26 NOTE — Progress Notes (Addendum)
Pt started having frequent bloody stools with large clots, pt became hypotensive. MDA Dr. Lanetta Inch at beside. 274ml bolus of fluid was administered and istat HGB was checked. HGB-11.2 and BP increased back to normal range. Dr. Georgette Dover aware and anesthesiology aware.

## 2018-03-26 NOTE — Progress Notes (Signed)
Notified Dr. Nyoka Cowden of CBG. Will recheck per Dr. Nyoka Cowden and recontact with updated CBG.

## 2018-03-26 NOTE — Anesthesia Procedure Notes (Signed)
Procedure Name: Intubation Date/Time: 03/26/2018 10:52 AM Performed by: Valda Favia, CRNA Pre-anesthesia Checklist: Patient identified, Emergency Drugs available, Suction available and Patient being monitored Patient Re-evaluated:Patient Re-evaluated prior to induction Oxygen Delivery Method: Circle System Utilized Preoxygenation: Pre-oxygenation with 100% oxygen Induction Type: IV induction Ventilation: Mask ventilation without difficulty and Oral airway inserted - appropriate to patient size Laryngoscope Size: Mac and 4 Grade View: Grade II Tube type: Oral Tube size: 7.5 mm Number of attempts: 1 Airway Equipment and Method: Stylet and Oral airway Placement Confirmation: ETT inserted through vocal cords under direct vision,  positive ETCO2 and breath sounds checked- equal and bilateral Secured at: 23 cm Tube secured with: Tape Dental Injury: Teeth and Oropharynx as per pre-operative assessment

## 2018-03-26 NOTE — Interval H&P Note (Signed)
History and Physical Interval Note:  03/26/2018 8:55 AM  [name]  has presented today for surgery, with the diagnosis of Colostomy after Hartmann's procedure  The various methods of treatment have been discussed with the patient and family. After consideration of risks, benefits and other options for treatment, the patient has consented to  Procedure(s): COLOSTOMY REVERSAL (N/A) as a surgical intervention .  The patient's history has been reviewed, patient examined, no change in status, stable for surgery.  I have reviewed the patient's chart and labs.  Questions were answered to the patient's satisfaction.     [name]

## 2018-03-26 NOTE — Significant Event (Signed)
Rapid Response Event Note  Overview: Time Called: [place] Time: 1826 Event Type: Neurologic  Initial Focused Assessment: Called by RN d/t "seizure activity". Upon arrival, patient is awake alert and oriented. PACU nurses at bedside. Patient with pale lips. Blood pressure initially normal, but dropped with next BP. MD paged. Patient requests bedpan, blood clots coming from rectum. Dr Georgette Dover came to bedside. Request made for ICU bed.  Interventions: IV fluid bolus Bed request to ICU made Labs ordered (CBC, CMET, T&S, troponin)  Plan of Care (if not transferred): Transferred to 2M12  Event Summary: Name of Physician Notified: Dr Gershon Crane at [phone]    at    Outcome: Transferred (Comment)  Event End Time: [place]

## 2018-03-26 NOTE — Op Note (Signed)
Preop diagnosis: Descending colostomy after urgent Hartman's procedure for perforated sigmoid diverticulitis  Postop diagnosis: Same Procedure performed: Colostomy reversal with stapled EEA anastomosis and rigid sigmoidoscopy Surgeon:Argenis Kumari K Jelesa Mangini Assistant: Dr. Fanny Skates Anesthesia: General endotracheal Indications: This is a 72 year old male who presented with perforated sigmoid diverticulitis in May of this year.  He underwent urgent Hartman's procedure on 12/13/2017.  He has recovered very nicely.  Colonoscopy shows no concerning findings in the remainder of his colon.  He has regained his appetite and has put on some weight.  He presents now for colostomy reversal.  Description of procedure: Patient is brought to the operating room placed in supine position on the operating room table.  After an adequate level of general anesthesia was obtained a Foley catheter was placed under sterile technique.  The patient's legs were placed in lithotomy position in yellowfin stirrups.  The colostomy was closed with a 3-0 silk pursestring suture.  His abdomen was prepped with ChloraPrep.  Betadine was used to prep the old colostomy as well as his perineum.  We draped in sterile fashion.  A timeout was taken to ensure the proper patient and proper procedure.  We open his midline incision.  We dissected down to the fascia.  We carefully dissected through the linea alba into the peritoneal cavity.  There are some adhesions of small bowel and omentum to the anterior abdominal wall.  These were taken down with Metzenbaum scissors.  The remainder the abdomen was relatively free of significant adhesions.  We placed a Balfour retractor and began examining the pelvis.  The 2 Prolene sutures that are marking the rectal stump were easily identified.  There is some small bowel adherent to the end of this staple line.  We took down these adhesions and mobilized the small bowel out of the pelvis.  There were 2 segments of  small bowel that had some very small serosal tears from the mobilization.  These were oversewn with 3-0 silk sutures.  This was packed away in the upper abdomen.  We inspected the rectal stump.  This seems fairly mobile and free of surrounding adhesions.  There are no other significant adhesions in the pelvis.  We then removed the Balfour retractor.  I mobilized the descending colostomy internally by taking down some of the flimsy adhesions.  Once were thoroughly mobilized the colostomy internally I used the cautery to make an elliptical incision around the ostomy site.  We dissected down to the subcutaneous tissues until the colostomy was completely mobilized.  We brought this back into the abdomen.  There was plenty of length for the descending colon and this reached easily to the pelvis.  No further mobilization was needed.  We carefully dissected around the end of the descending colon.  We identified an area of healthy appearing small bowel free of any diverticula.  We mobilized the mesentery and the surrounding adipose tissue with the LigaSure device.  The pursestring clamp was placed across this area.  I placed a pursestring suture of 2-0 Prolene.  The distal portion was occluded with a large Coker clamp.  We amputated the distal colostomy and sent this for pathologic examination.  We replaced the Balfour retractor.  I then removed the pursestring clamp.  The edges of the mucosa appeared to be healthy.  We used the EEA sizers.  The descending colon easily accepts a 31 mm sizer.  My assistant went below and examined the rectal stump.  We evacuated some mucus from the rectal  stump.  He was able to pass the 31 mm sizer easily to the end of the staple line.  We opened the EEA stapler 31 mm.  The anvil was inserted into the descending colon.  The pursestring suture was used to close the edges of the mucosa and serosa around the stem of the anvil.  We made sure that there is no twist in the descending colon.  The  EEA staple was then advanced through the rectum up to the proximal staple line.  The spike was advanced through the anterior wall of the colon.  This was connected to the anvil.  We tightened on the stapler held pressure for a minute and then fired the stapler.  The stapler was then opened and removed.  Rigid sigmoidoscopy showed no sign of bleeding.  We filled the pelvis with saline and occluded the colon proximally.  With insufflation of the rectum there was no air leak noted.  There is no tension on the anastomosis.  The air was released and the sigmoidoscope was removed.  We irrigated the abdomen thoroughly and inspected for hemostasis.  The retractors were removed.  We then changed gowns and gloves in accordance with the colorectal protocol.  We then infiltrated 40 cc of one-to-one diluted Exparel in the fascia around the descending colostomy as well as the midline fascia.  The old colostomy site was closed with multiple interrupted #1 Novafil sutures.  Double-stranded #1 PDS suture was used to close the midline incision.  The subcutaneous tissues were irrigated.  Staples were used to close the skin.  Dressings were applied.  We placed some loose staples in the old colostomy site to allow some drainage.  The patient was then extubated and brought to the recovery room in stable condition.  All sponge, instrument, and needle counts are correct.  Imogene Burn. Georgette Dover, MD, [facility]  General/ Trauma Surgery  03/26/2018 1:05 PM

## 2018-03-26 NOTE — Progress Notes (Signed)
Patient ID: BRODI KARI, male   DOB: 07/20/1946, 72 y.o.   MRN: [id number]    Patient began passing some clots and melena in PACU.  Two transient episodes of hypotension.  I-stat Hgb 11.2.  Upon transfer to floor, he had an episode where he became nauseated, then syncopal with some shaking of his arms.  Possibly seizure activity, although no history.  Patient now awake and talking, not post-ictal.    We performed sigmoidoscopy at the end of surgery and there was no bleeding at the staple line.  The patient is on two anti-platelet medications and had been instructed to hold these prior to surgery.  There has been some family emergency in the last few days, so his wife is not sure that he actually held these.  We will go ahead and transfuse platelets.    We will transfer to ICU.  I have consulted CCM for assistance.  Recheck labs.  Transfuse as needed.  We will pack gel-foam into the rectum up to the anastomosis.  Rule out cardiac event.  May need CT head to rule CVA.  Discussed with wife.  Imogene Burn. Georgette Dover, MD, [facility] Surgery  General/ Trauma Surgery  03/26/2018 7:09 PM

## 2018-03-26 NOTE — Anesthesia Preprocedure Evaluation (Signed)
Anesthesia Evaluation  Patient identified by MRN, date of birth, ID band Patient awake    Reviewed: Allergy & Precautions, NPO status , Patient's Chart, lab work & pertinent test results  Airway Mallampati: II  TM Distance: >3 FB     Dental   Pulmonary pneumonia, former smoker,    breath sounds clear to auscultation       Cardiovascular hypertension, + angina + CAD and + Peripheral Vascular Disease   Rhythm:Regular Rate:Normal     Neuro/Psych    GI/Hepatic Neg liver ROS, GERD  ,  Endo/Other  diabetes  Renal/GU Renal disease     Musculoskeletal   Abdominal   Peds  Hematology   Anesthesia Other Findings   Reproductive/Obstetrics                             Anesthesia Physical Anesthesia Plan  ASA: III  Anesthesia Plan: General   Post-op Pain Management:    Induction: Intravenous  PONV Risk Score and Plan: 2 and Treatment may vary due to age or medical condition, Ondansetron, Dexamethasone and Midazolam  Airway Management Planned: Oral ETT  Additional Equipment:   Intra-op Plan:   Post-operative Plan: Possible Post-op intubation/ventilation  Informed Consent: I have reviewed the patients History and Physical, chart, labs and discussed the procedure including the risks, benefits and alternatives for the proposed anesthesia with the patient or authorized representative who has indicated his/her understanding and acceptance.   Dental advisory given  Plan Discussed with: CRNA and Anesthesiologist  Anesthesia Plan Comments:         Anesthesia Quick Evaluation

## 2018-03-27 ENCOUNTER — Encounter (HOSPITAL_COMMUNITY): Payer: Self-pay | Admitting: Surgery

## 2018-03-27 ENCOUNTER — Inpatient Hospital Stay (HOSPITAL_COMMUNITY): Payer: Medicare Other

## 2018-03-27 DIAGNOSIS — I2510 Atherosclerotic heart disease of native coronary artery without angina pectoris: Secondary | ICD-10-CM

## 2018-03-27 DIAGNOSIS — G8918 Other acute postprocedural pain: Secondary | ICD-10-CM

## 2018-03-27 DIAGNOSIS — D62 Acute posthemorrhagic anemia: Secondary | ICD-10-CM

## 2018-03-27 LAB — BPAM PLATELET PHERESIS
BLOOD PRODUCT EXPIRATION DATE: 201908142359
Blood Product Expiration Date: 201908142359
ISSUE DATE / TIME: 201908132015
ISSUE DATE / TIME: 201908132121
UNIT TYPE AND RH: 5100
Unit Type and Rh: 7300

## 2018-03-27 LAB — CBC WITH DIFFERENTIAL/PLATELET
Abs Immature Granulocytes: 0.1 10*3/uL (ref 0.0–0.1)
BASOS ABS: 0 10*3/uL (ref 0.0–0.1)
Basophils Relative: 0 %
Eosinophils Absolute: 0.1 10*3/uL (ref 0.0–0.7)
Eosinophils Relative: 1 %
HEMATOCRIT: 23.4 % — AB (ref 39.0–52.0)
HEMOGLOBIN: 7.6 g/dL — AB (ref 13.0–17.0)
Immature Granulocytes: 1 %
LYMPHS ABS: 1 10*3/uL (ref 0.7–4.0)
LYMPHS PCT: 10 %
MCH: 30.3 pg (ref 26.0–34.0)
MCHC: 32.5 g/dL (ref 30.0–36.0)
MCV: 93.2 fL (ref 78.0–100.0)
Monocytes Absolute: 1.1 10*3/uL — ABNORMAL HIGH (ref 0.1–1.0)
Monocytes Relative: 11 %
Neutro Abs: 7.8 10*3/uL — ABNORMAL HIGH (ref 1.7–7.7)
Neutrophils Relative %: 77 %
Platelets: 273 10*3/uL (ref 150–400)
RBC: 2.51 MIL/uL — ABNORMAL LOW (ref 4.22–5.81)
RDW: 14.3 % (ref 11.5–15.5)
WBC: 10.1 10*3/uL (ref 4.0–10.5)

## 2018-03-27 LAB — PREPARE PLATELET PHERESIS
Unit division: 0
Unit division: 0

## 2018-03-27 LAB — PREPARE RBC (CROSSMATCH)

## 2018-03-27 LAB — HEMOGLOBIN AND HEMATOCRIT, BLOOD
HCT: 20.3 % — ABNORMAL LOW (ref 39.0–52.0)
Hemoglobin: 6.7 g/dL — CL (ref 13.0–17.0)

## 2018-03-27 LAB — RENAL FUNCTION PANEL
Albumin: 2.5 g/dL — ABNORMAL LOW (ref 3.5–5.0)
Anion gap: 11 (ref 5–15)
BUN: 22 mg/dL (ref 8–23)
CHLORIDE: 108 mmol/L (ref 98–111)
CO2: 21 mmol/L — ABNORMAL LOW (ref 22–32)
Calcium: 7.5 mg/dL — ABNORMAL LOW (ref 8.9–10.3)
Creatinine, Ser: 1.39 mg/dL — ABNORMAL HIGH (ref 0.61–1.24)
GFR calc Af Amer: 58 mL/min — ABNORMAL LOW (ref >60)
GFR, EST NON AFRICAN AMERICAN: 50 mL/min — AB (ref >60)
Glucose, Bld: 232 mg/dL — ABNORMAL HIGH (ref 70–99)
Phosphorus: 4.8 mg/dL — ABNORMAL HIGH (ref 2.5–4.6)
Potassium: 4.9 mmol/L (ref 3.5–5.1)
Sodium: 140 mmol/L (ref 135–145)

## 2018-03-27 LAB — GLUCOSE, CAPILLARY
GLUCOSE-CAPILLARY: 132 mg/dL — AB (ref 70–99)
Glucose-Capillary: 182 mg/dL — ABNORMAL HIGH (ref 70–99)
Glucose-Capillary: 185 mg/dL — ABNORMAL HIGH (ref 70–99)
Glucose-Capillary: 240 mg/dL — ABNORMAL HIGH (ref 70–99)

## 2018-03-27 LAB — CBC
HCT: 26.1 % — ABNORMAL LOW (ref 39.0–52.0)
Hemoglobin: 9 g/dL — ABNORMAL LOW (ref 13.0–17.0)
MCH: 31 pg (ref 26.0–34.0)
MCHC: 34.5 g/dL (ref 30.0–36.0)
MCV: 90 fL (ref 78.0–100.0)
PLATELETS: 280 10*3/uL (ref 150–400)
RBC: 2.9 MIL/uL — ABNORMAL LOW (ref 4.22–5.81)
RDW: 14.2 % (ref 11.5–15.5)
WBC: 13.5 10*3/uL — AB (ref 4.0–10.5)

## 2018-03-27 LAB — CORTISOL: Cortisol, Plasma: 34.1 ug/dL

## 2018-03-27 LAB — ECHOCARDIOGRAM COMPLETE
Height: 66 in
Weight: 2500.9 oz

## 2018-03-27 LAB — MAGNESIUM: MAGNESIUM: 2 mg/dL (ref 1.7–2.4)

## 2018-03-27 LAB — PROCALCITONIN: Procalcitonin: 0.51 ng/mL

## 2018-03-27 MED ORDER — DEXTROSE IN LACTATED RINGERS 5 % IV SOLN
INTRAVENOUS | Status: DC
  Administered 2018-03-27: 10:00:00 via INTRAVENOUS

## 2018-03-27 MED ORDER — FENTANYL CITRATE (PF) 100 MCG/2ML IJ SOLN
25.0000 ug | INTRAMUSCULAR | Status: DC | PRN
  Filled 2018-03-27: qty 2

## 2018-03-27 MED ORDER — INSULIN ASPART 100 UNIT/ML ~~LOC~~ SOLN
0.0000 [IU] | SUBCUTANEOUS | Status: DC
  Administered 2018-03-27: 3 [IU] via SUBCUTANEOUS
  Administered 2018-03-27: 5 [IU] via SUBCUTANEOUS
  Administered 2018-03-27: 2 [IU] via SUBCUTANEOUS
  Administered 2018-03-28 (×3): 3 [IU] via SUBCUTANEOUS
  Administered 2018-03-29: 5 [IU] via SUBCUTANEOUS
  Administered 2018-03-29: 2 [IU] via SUBCUTANEOUS
  Administered 2018-03-29: 3 [IU] via SUBCUTANEOUS
  Administered 2018-03-29: 2 [IU] via SUBCUTANEOUS
  Administered 2018-03-29: 5 [IU] via SUBCUTANEOUS
  Administered 2018-03-30: 2 [IU] via SUBCUTANEOUS
  Administered 2018-03-30: 5 [IU] via SUBCUTANEOUS
  Administered 2018-03-30: 2 [IU] via SUBCUTANEOUS

## 2018-03-27 MED ORDER — OXYCODONE HCL 5 MG PO TABS
5.0000 mg | ORAL_TABLET | Freq: Four times a day (QID) | ORAL | Status: DC | PRN
  Administered 2018-03-27 – 2018-03-28 (×2): 5 mg via ORAL
  Filled 2018-03-27 (×2): qty 1

## 2018-03-27 MED ORDER — SODIUM CHLORIDE 0.9% IV SOLUTION
Freq: Once | INTRAVENOUS | Status: AC
  Administered 2018-03-27: 09:00:00 via INTRAVENOUS

## 2018-03-27 MED ORDER — ORAL CARE MOUTH RINSE
15.0000 mL | Freq: Two times a day (BID) | OROMUCOSAL | Status: DC
  Administered 2018-03-28: 15 mL via OROMUCOSAL

## 2018-03-27 MED ORDER — SODIUM CHLORIDE 0.9% IV SOLUTION
Freq: Once | INTRAVENOUS | Status: AC
  Administered 2018-03-27: 02:00:00 via INTRAVENOUS

## 2018-03-27 MED FILL — Medication: Qty: 1 | Status: AC

## 2018-03-27 NOTE — Assessment & Plan Note (Signed)
Improving  Plan Change NS to LR at 75cc/h and monitor creat and K

## 2018-03-27 NOTE — Assessment & Plan Note (Signed)
Sugars 200s  Plan Change AC/HS ssi to Q4h

## 2018-03-27 NOTE — Assessment & Plan Note (Signed)
Per ccs

## 2018-03-27 NOTE — Assessment & Plan Note (Signed)
QTc high 400s this admit  Plan Repeat EKG 03/28/18

## 2018-03-27 NOTE — Progress Notes (Signed)
eLink Physician-Brief Progress Note Patient Name: [name] DOB: 25-Apr-1946 MRN: [id number]   Date of Service  03/27/2018  HPI/Events of Note  Anemia - Hgb = 6.7.   eICU Interventions  Transfuse 1 unit PRBC.     Intervention Category Major Interventions: Other:  Lysle Dingwall 03/27/2018, 1:39 AM

## 2018-03-27 NOTE — Assessment & Plan Note (Signed)
Recent Labs  Lab 03/20/18 1037 03/26/18 1617 03/26/18 1901 03/27/18 0043 03/27/18 0702  HGB 16.1 11.2* 9.6* 6.7* 7.6*   Recent Labs  Lab 03/20/18 1037 03/26/18 1901 03/27/18 0702  PLT 262 245 273   S/p 1 unit prbc and 1 platelet - yesterday  Plan 1 more unit prbc 03/27/2018 per CCS after that  - - PRBC for hgb </= 6.9gm%    - exceptions are   -  if ACS susepcted/confirmed then transfuse for hgb </= 8.0gm%,  or    -  active bleeding with hemodynamic instability, then transfuse regardless of hemoglobin value   At at all times try to transfuse 1 unit prbc as possible with exception of active hemorrhage

## 2018-03-27 NOTE — Assessment & Plan Note (Signed)
Trop x 1 negative  Plan Repeat trop 03/28/18

## 2018-03-27 NOTE — Progress Notes (Signed)
CRITICAL VALUE ALERT  Critical Value:  Hemoglobin 6.7  Date & Time Notied:  03/27/2018 0115  Provider Notified: Warren Lacy  Orders Received/Actions taken: awaiting transfuse orders

## 2018-03-27 NOTE — Assessment & Plan Note (Signed)
Resolved with PRBC and fluids  Pl;an Map goal > 65

## 2018-03-27 NOTE — Progress Notes (Signed)
  Echocardiogram 2D Echocardiogram has been performed.  [name] M 03/27/2018, 4:39 PM

## 2018-03-27 NOTE — Consult Note (Signed)
PULMONARY / CRITICAL CARE MEDICINE   Name: [name] MRN: [id number] DOB: [birth year]    ADMISSION DATE:  03/26/2018 CONSULTATION DATE:  03/26/2018  REFERRING MD:  Dr. Georgette Dover  CHIEF COMPLAINT:  Hypotension   BRIEF   72 year old male with PMH significant for but not limited to CAD/ CABG in 2017 on plavix and cilostazol , HTN, PVD, CKD, DM, neuropathy, GERD, HLD, and previous history of perforated sigmoid diverticulitis s/p Hartmann's procedure on 5/2 with benign surgical pathology. He has done well post-operatively.   Presented back for colostomy reversal and surgery went remarkably well.  A sigmoidoscopy was performed at the end of surgery to verify no bleeding at the staple line.  Patient states he has held his two antiplatelet meds for five days prior to surgery.  In PACU, patient began passing some clot and melena with istat Hgb 11.2; preop was 16.1.  Upon arrival to surgical floor, he had an episode where he became nauseated with probable vagal/ syncopal episode where he became unresponsive with some question of seizure activity due to shaking in his arms, however patient woke up and was not post-ictal.  He was hypotensive and therefore transferred to ICU.  EKG showing normal sinus rhythm with prolonged QTc at 0.489.  CCS at bedside to pack gel-foam into the rectum up to the anastomosis and 2 pack of platelets to be transfused ordered.  PCCM consulted for further medical assistance.     CULTURES: MRSA PCR 8/13 >> 8/13 BC x 2 >>  ANTIBIOTICS: 8/13 cefotetan - pre-op  SIGNIFICANT EVENTS: 5/2 Hartmann's procedure for perforated sigmoid diverticulitis  8/13 Reversal colostomy  - s.p PRBC 1 unit and platelets   SUBJECTIVE/OVERNIGHT/INTERVAL HX  8/14 - Per CCS - more alert with good UOP . No nausea per CCS. Improved BRPR. CSS plans 1 more unit PRBC for hgb 7.6 given CAD and PAD. Has AKI - better/stable They want him in ICU today, Ha sincisional pai - rest 5/10 and with mild palpation it  is 10/10 . No active bleeding  VITAL SIGNS: BP (!) 141/49   Pulse 86   Temp 98.3 F (36.8 C) (Oral)   Resp 15   Ht 5\' 6"  (1.676 m)   Wt 70.9 kg   SpO2 98%   BMI 25.23 kg/m   HEMODYNAMICS:    VENTILATOR SETTINGS:    INTAKE / OUTPUT: I/O last 3 completed shifts: In: 5679.8 [I.V.:1850; Blood:970; Other:2500; IV Piggyback:359.8] Out: 1484 [Urine:1450; Stool:9; Blood:25]  PHYSICAL EXAMINATION:  General Appearance:    Looks stable. Siting in bed 45 degree  Head:    Normocephalic, without obvious abnormality, atraumatic  Eyes:    PERRL - yes, conjunctiva/corneas - clear      Ears:    Normal external ear canals, both ears  Nose:   NG tube - no  Throat:  ETT TUBE - no , OG tube - no  Neck:   Supple,  No enlargement/tenderness/nodules     Lungs:     Clear to auscultation bilaterally,   Chest wall:    No deformity  Heart:    S1 and S2 normal, no murmur, CVP - no.  Pressors - no  Abdomen:     Soft, no masses, no organomegaly BUT VERY VERY TENDER  Genitalia:    Not done  Rectal:   not done  Extremities:   Extremities- intact     Skin:   Intact in exposed areas .      Neurologic:  Sedation - none -> RASS - +1 . Moves all 4s - yes. CAM-ICU - neg . Orientation - x3+     PULMONARY No results for input(s): PHART, PCO2ART, PO2ART, HCO3, TCO2, O2SAT in the last 168 hours.  Invalid input(s): PCO2, PO2  CBC Recent Labs  Lab 03/20/18 1037  03/26/18 1901 03/27/18 0043 03/27/18 0702  HGB 16.1   < > 9.6* 6.7* 7.6*  HCT 48.8   < > 30.2* 20.3* 23.4*  WBC 7.7  --  20.2*  --  10.1  PLT 262  --  245  --  273   < > = values in this interval not displayed.    COAGULATION Recent Labs  Lab 03/26/18 1901  INR 1.21    CARDIAC   Recent Labs  Lab 03/26/18 1901  TROPONINI <0.03   No results for input(s): PROBNP in the last 168 hours.   CHEMISTRY Recent Labs  Lab 03/20/18 1037 03/26/18 1617 03/26/18 1901 03/27/18 0043  NA 138 140 140 140  K 3.4* 3.9 4.1 4.9  CL  96*  --  106 108  CO2 29  --  23 21*  GLUCOSE 154* 197* 229* 232*  BUN 16  --  18 22  CREATININE 1.30*  --  1.40* 1.39*  CALCIUM 9.1  --  7.6* 7.5*  MG  --   --  1.3* 2.0  PHOS  --   --   --  4.8*   Estimated Creatinine Clearance: 44.6 mL/min (A) (by C-G formula based on SCr of 1.39 mg/dL (H)).   LIVER Recent Labs  Lab 03/26/18 1901 03/27/18 0043  AST 15  --   ALT 11  --   ALKPHOS 40  --   BILITOT 0.7  --   PROT 4.1*  --   ALBUMIN 2.5* 2.5*  INR 1.21  --      INFECTIOUS Recent Labs  Lab 03/27/18 0043  PROCALCITON 0.51     ENDOCRINE CBG (last 3)  Recent Labs    03/26/18 1834 03/26/18 2005 03/27/18 0805  GLUCAP 222* 208* 182*         IMAGING x48h  - image(s) personally visualized  -   highlighted in bold No results found.   Imaging No results found.  STUDIES:   none DISCUSSION: 73 yoM with previous CAD/ CABG 2017 on DAPT (unclear if this was held), who underwent reversal of his Hartmann's procedure (performed 5/2).  Developed some rectal clots and melena in PACU with istat Hgb 11.2 and on arrival to floor had hypotension with vagal/ syncopal episodes.  Given 1L LR and transferred to ICU, on arrival was alert and normotensive.  CCS at bedside s/p packing gel-foam into the rectum up to the anastomosis.    While in ICU, patient passed several more clots, BP dropped to SBP 60's and became diaphoretic.  2nd liter infusing.  ASSESSMENT / PLAN:   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 Per ccs  Hypotension Resolved with PRBC and fluids  Pl;an Map goal > 65  AKI (acute kidney injury) ([place]) Improving  Plan Change NS to LR at 75cc/h and monitor creat and K  Acute post-hemorrhagic anemia Recent Labs  Lab 03/20/18 1037 03/26/18 1617 03/26/18 1901 03/27/18 0043 03/27/18 0702  HGB 16.1 11.2* 9.6* 6.7* 7.6*   Recent Labs  Lab 03/20/18 1037 03/26/18 1901 03/27/18 0702  PLT 262 245 273   S/p 1 unit prbc and 1 platelet -  yesterday  Plan 1 more unit prbc 03/27/2018 per  CCS after that  - - PRBC for hgb </= 6.9gm%    - exceptions are   -  if ACS susepcted/confirmed then transfuse for hgb </= 8.0gm%,  or    -  active bleeding with hemodynamic instability, then transfuse regardless of hemoglobin value   At at all times try to transfuse 1 unit prbc as possible with exception of active hemorrhage      Acute post-operative pain Change morphine prn to fent prn in light of AKI  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 Brand Males, M.D., [facility].C.P Pulmonary and Critical Care Medicine Staff Physician, [place] Director - Interstitial Lung Disease  Program  Pulmonary [place] at [place], [place], [address]  Pager: [phone], If no answer or between  15:00h - 7:00h: call [phone] Telephone: [phone]

## 2018-03-27 NOTE — Assessment & Plan Note (Signed)
Change morphine prn to fent prn in light of AKI

## 2018-03-27 NOTE — Progress Notes (Signed)
1 Day Post-Op   Subjective/Chief Complaint: Patient is much more alert this morning.  No complaints except thirsty Transfused platelets last night - significant decrease in hematochezia after platelets Received one unit PRBC for Hgb 6.7 No nausea Good UOP   Objective: Vital signs in last 24 hours: Temp:  [97.4 F (36.3 C)-98.8 F (37.1 C)] 98.1 F (36.7 C) (08/14 0808) Pulse Rate:  [54-106] 87 (08/14 0600) Resp:  [6-23] 17 (08/14 0600) BP: (62-180)/(38-97) 160/54 (08/14 0600) SpO2:  [92 %-100 %] 99 % (08/14 0600) Weight:  [70.9 kg] 70.9 kg (08/13 2000) Last BM Date: 03/26/18  Intake/Output from previous day: 08/13 0701 - 08/14 0700 In: 5604.9 [I.V.:1775; Blood:970; IV Piggyback:359.8] Out: 4235 [Urine:1350; Stool:9; Blood:25] Intake/Output this shift: No intake/output data recorded.  General appearance: alert, cooperative and no distress Resp: clear to auscultation bilaterally Cardio: regular rate and rhythm, S1, S2 normal, no murmur, click, rub or gallop GI: soft, incisional tenderness; hypoactive BS Midline incision c/d/i - LLQ incision - some serosanguinous drainage on gauze  Lab Results:  Recent Labs    03/26/18 1901 03/27/18 0043 03/27/18 0702  WBC 20.2*  --  10.1  HGB 9.6* 6.7* 7.6*  HCT 30.2* 20.3* 23.4*  PLT 245  --  273   BMET Recent Labs    03/26/18 1901 03/27/18 0043  NA 140 140  K 4.1 4.9  CL 106 108  CO2 23 21*  GLUCOSE 229* 232*  BUN 18 22  CREATININE 1.40* 1.39*  CALCIUM 7.6* 7.5*   PT/INR Recent Labs    03/26/18 1901  LABPROT 15.2  INR 1.21   ABG No results for input(s): PHART, HCO3 in the last 72 hours.  Invalid input(s): PCO2, PO2  Studies/Results: No results found.  Anti-infectives: Anti-infectives (From admission, onward)   Start     Dose/Rate Route Frequency Ordered Stop   03/26/18 2300  cefoTEtan (CEFOTAN) 2 g in sodium chloride 0.9 % 100 mL IVPB     2 g 200 mL/hr over 30 Minutes Intravenous Every 12 hours 03/26/18  1606 03/27/18 0029   03/26/18 0900  cefoTEtan (CEFOTAN) 2 g in sodium chloride 0.9 % 100 mL IVPB     2 g 200 mL/hr over 30 Minutes Intravenous To [facility]Stay Surgical 03/25/18 1443 03/26/18 1125      Assessment/Plan: S/p colostomy reversal with stapled EEA anastomosis 3/61/44 Complication - post-operative bleeding probably from anastomotic staple line and dysfunctional platelets; significantly improved after platelet transfusion Acute blood loss anemia - Hgb 7.6 after 1 u PRBC.  Will transfuse one more unit today. Clear liquids OOB to chair Hold Toradol - use Vicodin or low-dose morphine for pain control.  Also, his creatinine is slightly elevated Will switch to scheduled tylenol  Leave in ICU today  LOS: 1 day    [name] 03/27/2018

## 2018-03-27 NOTE — Anesthesia Postprocedure Evaluation (Signed)
Anesthesia Post Note  Patient: [name]  Procedure(s) Performed: COLOSTOMY REVERSAL (N/A Abdomen)     Patient location during evaluation: ICU Anesthesia Type: General Level of consciousness: awake and alert Pain management: pain level controlled Vital Signs Assessment: post-procedure vital signs reviewed and stable Respiratory status: spontaneous breathing, nonlabored ventilation, respiratory function stable and patient connected to nasal cannula oxygen Cardiovascular status: stable Postop Assessment: no apparent nausea or vomiting Anesthetic complications: no Comments: Multiple episodes of hypotension and possible seizure activity in PACU and overnight. Pt more alert this AM. Dr. Georgette Dover monitoring and managing. Transfused PRBC's and PLTS. BP currently stable, will likely receive additional unit of PRBC's.  Lab Results      Component                Value               Date                      WBC                      10.1                03/27/2018                HGB                      7.6 (L)             03/27/2018                HCT                      23.4 (L)            03/27/2018                MCV                      93.2                03/27/2018                PLT                      273                 03/27/2018               Last Vitals:  Vitals:   03/27/18 0800 03/27/18 0808  BP: (!) 103/52   Pulse: 87   Resp: 10   Temp:  36.7 C  SpO2: 99%     Last Pain:  Vitals:   03/27/18 0837  TempSrc:   PainSc: [place] Alandria Butkiewicz

## 2018-03-27 NOTE — Progress Notes (Signed)
Inpatient Diabetes Program Recommendations  AACE/ADA: New Consensus Statement on Inpatient Glycemic Control (2019)  Target Ranges:  Prepandial:   less than 140 mg/dL      Peak postprandial:   less than 180 mg/dL (1-2 hours)      Critically ill patients:  140 - 180 mg/dL   Results for CALLAGHAN, LAVERDURE (MRN [id number]) as of 03/27/2018 10:17  Ref. Range 03/26/2018 08:14 03/26/2018 09:08 03/26/2018 10:22 03/26/2018 13:09 03/26/2018 18:34 03/26/2018 20:05 03/27/2018 08:05  Glucose-Capillary Latest Ref Range: 70 - 99 mg/dL 331 (H) 293 (H) 239 (H) 189 (H) 222 (H) 208 (H) 182 (H)   Review of Glycemic Control  Diabetes history: DM2 Outpatient Diabetes medications: Latnus 25 units QHS, Metformin 500 mg BID Current orders for Inpatient glycemic control: Novolog 0-15 units Q4H  Inpatient Diabetes Program Recommendations:  Correction (SSI): Noted Novolog 0-15 units Q4H ordered this morning and was given starting at 8:38 am today.  Insulin - Basal: If glucose is consistently greater than 180 mg/dl with Novolog correction, please consider ordering low dose basal insulin ( would recommend starting with Lantus 7 units Q24H).  Thanks, Barnie Alderman, RN, MSN, CDE Diabetes Coordinator Inpatient Diabetes Program [phone] (Team Pager from 8am to 5pm)

## 2018-03-28 DIAGNOSIS — K5792 Diverticulitis of intestine, part unspecified, without perforation or abscess without bleeding: Secondary | ICD-10-CM

## 2018-03-28 DIAGNOSIS — R778 Other specified abnormalities of plasma proteins: Secondary | ICD-10-CM

## 2018-03-28 DIAGNOSIS — D62 Acute posthemorrhagic anemia: Secondary | ICD-10-CM

## 2018-03-28 DIAGNOSIS — R748 Abnormal levels of other serum enzymes: Secondary | ICD-10-CM

## 2018-03-28 DIAGNOSIS — R7989 Other specified abnormal findings of blood chemistry: Secondary | ICD-10-CM

## 2018-03-28 DIAGNOSIS — N179 Acute kidney failure, unspecified: Secondary | ICD-10-CM

## 2018-03-28 LAB — CBC WITH DIFFERENTIAL/PLATELET
ABS IMMATURE GRANULOCYTES: 0.1 10*3/uL (ref 0.0–0.1)
Abs Immature Granulocytes: 0.1 10*3/uL (ref 0.0–0.1)
BASOS PCT: 1 %
BASOS PCT: 1 %
Basophils Absolute: 0.1 10*3/uL (ref 0.0–0.1)
Basophils Absolute: 0.1 10*3/uL (ref 0.0–0.1)
EOS ABS: 0.1 10*3/uL (ref 0.0–0.7)
EOS PCT: 2 %
EOS PCT: 1 %
Eosinophils Absolute: 0.2 10*3/uL (ref 0.0–0.7)
HCT: 28 % — ABNORMAL LOW (ref 39.0–52.0)
HEMATOCRIT: 23.4 % — AB (ref 39.0–52.0)
HEMOGLOBIN: 9.2 g/dL — AB (ref 13.0–17.0)
Hemoglobin: 7.8 g/dL — ABNORMAL LOW (ref 13.0–17.0)
IMMATURE GRANULOCYTES: 1 %
Immature Granulocytes: 1 %
LYMPHS ABS: 1.2 10*3/uL (ref 0.7–4.0)
Lymphocytes Relative: 12 %
Lymphocytes Relative: 11 %
Lymphs Abs: 1 10*3/uL (ref 0.7–4.0)
MCH: 31.1 pg (ref 26.0–34.0)
MCH: 30.5 pg (ref 26.0–34.0)
MCHC: 32.9 g/dL (ref 30.0–36.0)
MCHC: 33.3 g/dL (ref 30.0–36.0)
MCV: 92.7 fL (ref 78.0–100.0)
MCV: 93.2 fL (ref 78.0–100.0)
MONO ABS: 0.7 10*3/uL (ref 0.1–1.0)
MONOS PCT: 7 %
MONOS PCT: 10 %
Monocytes Absolute: 0.9 10*3/uL (ref 0.1–1.0)
NEUTROS ABS: 7.5 10*3/uL (ref 1.7–7.7)
NEUTROS PCT: 75 %
Neutro Abs: 7.2 10*3/uL (ref 1.7–7.7)
Neutrophils Relative %: 78 %
PLATELETS: 209 10*3/uL (ref 150–400)
PLATELETS: 199 10*3/uL (ref 150–400)
RBC: 3.02 MIL/uL — ABNORMAL LOW (ref 4.22–5.81)
RBC: 2.51 MIL/uL — AB (ref 4.22–5.81)
RDW: 15.6 % — ABNORMAL HIGH (ref 11.5–15.5)
RDW: 14.9 % (ref 11.5–15.5)
WBC: 9.6 10*3/uL (ref 4.0–10.5)
WBC: 9.5 10*3/uL (ref 4.0–10.5)

## 2018-03-28 LAB — TROPONIN I
Troponin I: 0.04 ng/mL (ref <0.03)
Troponin I: 0.04 ng/mL (ref <0.03)

## 2018-03-28 LAB — HEPATIC FUNCTION PANEL
ALK PHOS: 37 U/L — AB (ref 38–126)
ALT: 11 U/L (ref 0–44)
AST: 15 U/L (ref 15–41)
Albumin: 2.5 g/dL — ABNORMAL LOW (ref 3.5–5.0)
BILIRUBIN DIRECT: 0.1 mg/dL (ref 0.0–0.2)
BILIRUBIN INDIRECT: 0.6 mg/dL (ref 0.3–0.9)
Total Bilirubin: 0.7 mg/dL (ref 0.3–1.2)
Total Protein: 4.6 g/dL — ABNORMAL LOW (ref 6.5–8.1)

## 2018-03-28 LAB — BASIC METABOLIC PANEL
ANION GAP: 6 (ref 5–15)
BUN: 22 mg/dL (ref 8–23)
CALCIUM: 7.6 mg/dL — AB (ref 8.9–10.3)
CO2: 26 mmol/L (ref 22–32)
CREATININE: 1.19 mg/dL (ref 0.61–1.24)
Chloride: 108 mmol/L (ref 98–111)
Glucose, Bld: 105 mg/dL — ABNORMAL HIGH (ref 70–99)
Potassium: 3.7 mmol/L (ref 3.5–5.1)
Sodium: 140 mmol/L (ref 135–145)

## 2018-03-28 LAB — GLUCOSE, CAPILLARY
GLUCOSE-CAPILLARY: 104 mg/dL — AB (ref 70–99)
GLUCOSE-CAPILLARY: 108 mg/dL — AB (ref 70–99)
Glucose-Capillary: 105 mg/dL — ABNORMAL HIGH (ref 70–99)
Glucose-Capillary: 131 mg/dL — ABNORMAL HIGH (ref 70–99)
Glucose-Capillary: 174 mg/dL — ABNORMAL HIGH (ref 70–99)
Glucose-Capillary: 186 mg/dL — ABNORMAL HIGH (ref 70–99)
Glucose-Capillary: 192 mg/dL — ABNORMAL HIGH (ref 70–99)

## 2018-03-28 LAB — MAGNESIUM: Magnesium: 1.9 mg/dL (ref 1.7–2.4)

## 2018-03-28 LAB — PREPARE RBC (CROSSMATCH)

## 2018-03-28 LAB — PHOSPHORUS: Phosphorus: 2.7 mg/dL (ref 2.5–4.6)

## 2018-03-28 MED ORDER — POLYVINYL ALCOHOL 1.4 % OP SOLN
1.0000 [drp] | OPHTHALMIC | Status: DC | PRN
  Administered 2018-03-28 – 2018-03-29 (×2): 1 [drp] via OPHTHALMIC
  Filled 2018-03-28: qty 15

## 2018-03-28 MED ORDER — SODIUM CHLORIDE 0.9% IV SOLUTION
Freq: Once | INTRAVENOUS | Status: AC
  Administered 2018-03-28: 08:00:00 via INTRAVENOUS

## 2018-03-28 MED ORDER — SODIUM CHLORIDE 0.9 % IV SOLN: INTRAVENOUS | Status: DC

## 2018-03-28 MED ORDER — HYDRALAZINE HCL 20 MG/ML IJ SOLN
10.0000 mg | Freq: Four times a day (QID) | INTRAMUSCULAR | Status: DC | PRN
  Filled 2018-03-28: qty 1

## 2018-03-28 MED ORDER — DEXTROSE-NACL 5-0.9 % IV SOLN
INTRAVENOUS | Status: DC
  Administered 2018-03-28: 10:00:00 via INTRAVENOUS

## 2018-03-28 NOTE — Progress Notes (Signed)
PROGRESS NOTE    DATHAN ATTIA  DZH:[id number] DOB: 08-26-1945 DOA: 03/26/2018 PCP: [name], MD    Brief Narrative: 72 year old male with PMH significant for but not limited to CAD/ CABG in 2017 on plavix and cilostazol , HTN, PVD, CKD, DM, neuropathy, GERD, HLD, and previous history of perforated sigmoid diverticulitis s/p Hartmann's procedure on 5/2 with benign surgical pathology. He has done well post-operatively.   Presented back for colostomy reversal..  A sigmoidoscopy was performed at the end of surgery to verify no bleeding at the staple line.  Patient states he has held his two antiplatelet meds for five days prior to surgery.  In PACU, patient began passing some clot and melena with istat Hgb 11.2; preop was 16.1.  Upon arrival to surgical floor, he had an episode where he became nauseated with probable vagal/ syncopal episode where he became unresponsive with some question of seizure activity due to shaking in his arms, however patient woke up and was not post-ictal.  He was hypotensive and therefore transferred to ICU.  EKG showing normal sinus rhythm with prolonged QTc at 0.489.  CCS at bedside to pack gel-foam into the rectum up to the anastomosis and 2 pack of platelets to be transfused ordered.  Patient received one PRBC 8-14. [facility] consulted by CCM to help with medical care.    Assessment & Plan:   Principal Problem:   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 Active Problems:   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   Hypotension   AKI (acute kidney injury) ([place])   Type 2 diabetes mellitus ([facility])   ASCVD (arteriosclerotic cardiovascular disease)   Type 2 diabetes mellitus with diabetic neuropathy ([facility])   S/P colostomy takedown   Syncope   Prolonged Q-T interval on ECG   Acute post-hemorrhagic anemia   Acute post-operative pain  History of Diverticulitis, S/P Sigmoid Colectomy/Colostomy 12-13-2017.  Admitted 03-26-2018 for elective colostomy reversal.    Management per surgery.   Post Operative Acute Blood loss anemia;  Hb this am at 7.8. Agree with another unit PRBC in setting of mild elevation troponin.  He had oly one small bloody BM yesterday.  Monitor hb.  Continue with IV fluids.   Syncope;  ECHO; Normal Ef, no diastolic dysfunction.  Mild elevation of troponin. Suspect demand ischemia. Plan to transfuse one unit PRBC, keep hb above 8.  Cardiology consulted.  Suspect syncope was related to hypovolemia.   Hypotension; resolved, now hypertensive.  In setting of acute blood loss anemia.   HTN; continue with metoprolol.  Will add PRN hydralazine.   AKI; continue with IV fluids.   DM type 2;  CBG overnight and today 104 range. Hold on starting lantus. Poor oral intake.  Continue with SSI.   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 No anticoagulation in setting of post op bleeding.  Code Status: Full code.  Family Communication: Care discussed with patient.  Disposition Plan: Remain in the ICU.    Procedures: Colostomy reversal 8-13  Antimicrobials: none  Subjective: He denies chest pain or dyspnea.  He report one small blood BM yesterday  No worsening abdominal pain   Objective: Vitals:   03/28/18 0400 03/28/18 0500 03/28/18 0600 03/28/18 0731  BP: (!) 161/76 136/63 (!) 171/64   Pulse: 73 (!) 59 82   Resp: 15 10 20    Temp:    97.9 F (36.6 C)  TempSrc:    Oral  SpO2:  94% 95% 95%   Weight:      Height:        Intake/Output Summary (Last 24 hours) at 03/28/2018 0744 Last data filed at 03/28/2018 0600 Gross per 24 hour  Intake 1164.95 ml  Output 1175 ml  Net -10.05 ml   Filed Weights   03/26/18 0811 03/26/18 2000  Weight: 69 kg 70.9 kg    Examination:  General exam: NAD Respiratory system: CTA Cardiovascular system: S1 & S2 heard, RRR. No JVD, murmurs, rubs, gallops or clicks. No pedal edema. Gastrointestinal system: BS present, soft, dressing mid abdomen.   Central nervous system: Alert and oriented. No focal neurological deficits. Extremities: Symmetric 5 x 5 power. Skin: No rashes, lesions or ulcers Psychiatry: Judgement and insight appear normal. Mood & affect appropriate.     Data Reviewed: I have personally reviewed following labs and imaging studies  CBC: Recent Labs  Lab 03/26/18 1901 03/27/18 0043 03/27/18 0702 03/27/18 1255 03/28/18 0007  WBC 20.2*  --  10.1 13.5* 9.5  NEUTROABS  --   --  7.8*  --  7.2  HGB 9.6* 6.7* 7.6* 9.0* 7.8*  HCT 30.2* 20.3* 23.4* 26.1* 23.4*  MCV 93.2  --  93.2 90.0 93.2  PLT 245  --  273 280 366   Basic Metabolic Panel: Recent Labs  Lab 03/26/18 1617 03/26/18 1901 03/27/18 0043 03/28/18 0007  NA 140 140 140 140  K 3.9 4.1 4.9 3.7  CL  --  106 108 108  CO2  --  23 21* 26  GLUCOSE 197* 229* 232* 105*  BUN  --  18 22 22   CREATININE  --  1.40* 1.39* 1.19  CALCIUM  --  7.6* 7.5* 7.6*  MG  --  1.3* 2.0 1.9  PHOS  --   --  4.8* 2.7   GFR: Estimated Creatinine Clearance: 52.1 mL/min (by C-G formula based on SCr of 1.19 mg/dL). Liver Function Tests: Recent Labs  Lab 03/26/18 1901 03/27/18 0043 03/28/18 0007  AST 15  --  15  ALT 11  --  11  ALKPHOS 40  --  37*  BILITOT 0.7  --  0.7  PROT 4.1*  --  4.6*  ALBUMIN 2.5* 2.5* 2.5*   No results for input(s): LIPASE, AMYLASE in the last 168 hours. No results for input(s): AMMONIA in the last 168 hours. Coagulation Profile: Recent Labs  Lab 03/26/18 1901  INR 1.21   Cardiac Enzymes: Recent Labs  Lab 03/26/18 1901 03/28/18 0007  TROPONINI <0.03 0.04*   BNP (last 3 results) No results for input(s): PROBNP in the last 8760 hours. HbA1C: No results for input(s): HGBA1C in the last 72 hours. CBG: Recent Labs  Lab 03/27/18 1535 03/27/18 2022 03/28/18 0010 03/28/18 0351 03/28/18 0730  GLUCAP 240* 132* 105* 104* 108*   Lipid Profile: No results for input(s): CHOL, HDL, LDLCALC, TRIG, CHOLHDL, LDLDIRECT in the last 72  hours. Thyroid Function Tests: No results for input(s): TSH, T4TOTAL, FREET4, T3FREE, THYROIDAB in the last 72 hours. Anemia Panel: No results for input(s): VITAMINB12, FOLATE, FERRITIN, TIBC, IRON, RETICCTPCT in the last 72 hours. Sepsis Labs: Recent Labs  Lab 03/27/18 0043  PROCALCITON 0.51    Recent Results (from the past 240 hour(s))  Culture, blood (routine x 2)     Status: None (Preliminary result)   Collection Time: 03/26/18  8:15 PM  Result Value Ref Range Status   Specimen Description BLOOD RIGHT HAND  Final   Special Requests  Final    BOTTLES DRAWN AEROBIC ONLY Blood Culture results may not be optimal due to an inadequate volume of blood received in culture bottles   Culture   Final    NO GROWTH < 24 HOURS Performed at [facility] [address], [place], [place] [address]    Report Status PENDING  Incomplete  MRSA PCR Screening     Status: None   Collection Time: 03/26/18  9:56 PM  Result Value Ref Range Status   MRSA by PCR NEGATIVE NEGATIVE Final    Comment:        The GeneXpert MRSA Assay (FDA approved for NASAL specimens only), is one component of a comprehensive MRSA colonization surveillance program. It is not intended to diagnose MRSA infection nor to guide or monitor treatment for MRSA infections. Performed at [facility], [place] [address], [place], [place] [address]          Radiology Studies: No results found.      Scheduled Meds: . acetaminophen  1,000 mg Oral Q6H  . atorvastatin  40 mg Oral q1800  . ferrous sulfate  325 mg Oral BID WC  . gabapentin  300 mg Oral TID  . insulin aspart  0-15 Units Subcutaneous Q4H  . mouth rinse  15 mL Mouth Rinse BID  . metoprolol tartrate  25 mg Oral BID  . pantoprazole  40 mg Oral Daily   Continuous Infusions: . sodium chloride       LOS: 2 days    Time spent: 35 minutes.     Elmarie Shiley, MD [facility] Pager [phone]  If 7PM-7AM, please contact  night-coverage [email or web address] Password [facility] 03/28/2018, 7:44 AM

## 2018-03-28 NOTE — Progress Notes (Signed)
CRITICAL VALUE ALERT  Critical Value:  Troponin 0.04  Date & Time Notied:  03/28/2018 0150  Provider Notified: Warren Lacy  Orders Received/Actions taken: awaiting orders

## 2018-03-28 NOTE — Progress Notes (Signed)
2 Days Post-Op   Subjective/Chief Complaint: Patient resting comfortably Awake, alert Had several more BM - some stool, some blood clot No chest pain, no SOB Passing flatus  Troponin mildly elevated today Hgb down to 7.8 from 9.0   Objective: Vital signs in last 24 hours: Temp:  [97.7 F (36.5 C)-98.3 F (36.8 C)] 97.9 F (36.6 C) (08/15 0731) Pulse Rate:  [57-87] 82 (08/15 0600) Resp:  [4-20] 20 (08/15 0600) BP: (103-171)/(43-112) 171/64 (08/15 0600) SpO2:  [88 %-100 %] 95 % (08/15 0600) Last BM Date: 03/27/18  Intake/Output from previous day: 08/14 0701 - 08/15 0700 In: 1165 [I.V.:850; Blood:315] Out: 1175 [Urine:1175] Intake/Output this shift: No intake/output data recorded.  WDWN in NAD Abd - soft, incisional tenderness - improved from yesterday Non-distended Midline incision - c/d/i through honeycomb dressing LLQ ostomy site - minimal drainage, no erythema  Lab Results:  Recent Labs    03/27/18 1255 03/28/18 0007  WBC 13.5* 9.5  HGB 9.0* 7.8*  HCT 26.1* 23.4*  PLT 280 199   BMET Recent Labs    03/27/18 0043 03/28/18 0007  NA 140 140  K 4.9 3.7  CL 108 108  CO2 21* 26  GLUCOSE 232* 105*  BUN 22 22  CREATININE 1.39* 1.19  CALCIUM 7.5* 7.6*   PT/INR Recent Labs    03/26/18 1901  LABPROT 15.2  INR 1.21   ABG No results for input(s): PHART, HCO3 in the last 72 hours.  Invalid input(s): PCO2, PO2  Studies/Results: No results found.  Anti-infectives: Anti-infectives (From admission, onward)   Start     Dose/Rate Route Frequency Ordered Stop   03/26/18 2300  cefoTEtan (CEFOTAN) 2 g in sodium chloride 0.9 % 100 mL IVPB     2 g 200 mL/hr over 30 Minutes Intravenous Every 12 hours 03/26/18 1606 03/27/18 0029   03/26/18 0900  cefoTEtan (CEFOTAN) 2 g in sodium chloride 0.9 % 100 mL IVPB     2 g 200 mL/hr over 30 Minutes Intravenous To ShortStay Surgical 03/25/18 1443 03/26/18 1125      Assessment/Plan: S/p colostomy reversal with  stapled EEA anastomosis 12/29/98 Complication - post-operative bleeding probably from anastomotic staple line and dysfunctional platelets; significantly improved after platelet transfusion.  Bowel movements with some old blood, but no apparent active bleeding Acute blood loss anemia - Hgb up to 9.0 after 2 u PRBC.  Drifted back down to 7.8.  Will transfuse one more unit today. Troponin slightly elevated - ?some strain due to acute blood loss anemia.  Will ask cardiology to see.   Carb modified diet OOB to chair Hold Toradol - use Oxycodone or low-dose morphine for pain control.  Also, his creatinine is improved Will use scheduled tylenol Transfer to stepdown  LOS: 2 days    [name] 03/28/2018

## 2018-03-28 NOTE — Consult Note (Addendum)
Cardiology Consultation:   Patient ID: [name]; [id number]; [birth year]   Admit date: 03/26/2018 Date of Consult: 03/28/2018  Primary Care Provider: [name], MD Primary Cardiologist: Quay Burow, MD    Patient Profile:   [name] is a 72 y.o. male with a hx of CAD status post CABG and left circumflex stent, hypertension, CKD, diabetes, hyperlipidemia, GERD, prior tobacco abuse and PAD status post Pham-Pham crossover grafting here for colostomy reversal who is being seen today for the evaluation of elevated troponin at the request of Dr. Georgette Dover.  History of Present Illness:   [name] had perforated sigmoid diverticulitis and underwent a Hartman's procedure on 12/2017.  He presented to the hospital for colostomy reversal on 03/26/18.  Postoperatively he developed melena and had an acute drop in his hemoglobin from 16 to 11.  He then had an episode of nausea and syncope after arriving to the floor.  This was thought to be vagal.  His wife notes that each time he had a melanotic stool he nearly passed out.  She watched his HR and BP go down on the monitor.  He was hypotensive and transferred to the ICU.  EKG was unremarkable.  He was transfused 2 units of platelets and 1 unit of packed red blood cells on 03/27/2018.  His hemoglobin continued to drop to a nadir of 6.7 and he has since received a total of 3 units of pRBCs.  He had an echocardiogram 03/27/2018 that revealed LVEF 55 to 60% with normal diastolic function.  There were no wall motion abnormalities.  EKG is revealed sinus rhythm with no ischemic changes.  He has not had any arrhythmias on telemetry.  Troponin was checked and was elevated to 0.04 and flat.  Cardiology was consulted.  He has not had any chest pain.  At baseline he is not very physically active but does not have any chest pain or shortness of breath.  He also denies lower extremity edema, orthopnea, or PND.  He has not expands any chest pain since his CABG  10/2015.   Past Medical History:  Diagnosis Date  . CAD S/P percutaneous coronary angioplasty 1998   PCI TO CX  . CKD (chronic kidney disease) stage 3, GFR 30-59 ml/min (HCC)   . Diabetes mellitus without complication ([place])   . Diverticulitis 08/16/2017   hospitalized with diverticulitis/sepsis  . GERD (gastroesophageal reflux disease)    occassionally  . 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 Hypercholesteremia   . Hypertension   . Neuropathy   . Peripheral vascular disease ([place])    s/p R-L FEM-FEM BYPASS  . Pneumonia   . Tachycardia    after CABG, pt on medicine for this    Past Surgical History:  Procedure Laterality Date  . ABDOMINAL AORTOGRAM Bilateral 09/19/2016   Procedure: iliac;  Surgeon: Serafina Mitchell, MD;  Location: [facility];  Service: Cardiovascular;  Laterality: Bilateral;  . CARDIAC CATHETERIZATION  2003   with stent  . CARDIAC CATHETERIZATION N/A 10/11/2015   Procedure: Left Heart Cath and Coronary Angiography;  Surgeon: Peter M Martinique, MD;  Location: [facility];  Service: Cardiovascular;  Laterality: N/A;  . COLON RESECTION N/A 12/13/2017   Procedure: EXPLORATORY LAPAROTOMY, SIGMOID COLECTOMY WITH COLOSTOMY;  Surgeon: Donnie Mesa, MD;  Location: [facility];  Service: General;  Laterality: N/A;  . COLONOSCOPY N/A 09/22/2013   Procedure: COLONOSCOPY;  Surgeon: Daneil Dolin, MD;  [facility];  Service: Endoscopy;  Laterality: N/A;  9:30 AM  . COLONOSCOPY N/A 02/19/2018   Procedure: COLONOSCOPY;  Surgeon: Daneil Dolin, MD;  [facility];  Service: Endoscopy;  Laterality: N/A;  2:00pm  . COLOSTOMY N/A 12/13/2017   Procedure: COLOSTOMY;  Surgeon: Donnie Mesa, MD;  Location: [facility];  Service: General;  Laterality: N/A;  . COLOSTOMY REVERSAL N/A 03/26/2018   Procedure: COLOSTOMY REVERSAL;  Surgeon: Donnie Mesa, MD;  Location: [facility];  Service: General;  Laterality: N/A;  . CORONARY ARTERY BYPASS GRAFT N/A 10/18/2015   Procedure: CORONARY  ARTERY BYPASS GRAFTING (CABG) x  five, using left internal mammary artery and right leg greater saphenous vein harvested endoscopically;  Surgeon: [name], MD;  Location: [facility];  Service: Open Heart Surgery;  Laterality: N/A;  . ENDARTERECTOMY FEMORAL Right 08/09/2016   Procedure: ENDARTERECTOMY FEMORAL WITH VEIN PATCH ANGIOPLASTY;  Surgeon: Serafina Mitchell, MD;  [facility];  Service: Vascular;  Laterality: Right;  . ESOPHAGOGASTRODUODENOSCOPY N/A 10/24/2017   Dr. Gala Romney: hiatal hernia  . FEMORAL-FEMORAL BYPASS GRAFT Bilateral 08/09/2016   Procedure: REVISION BYPASS GRAFT RIGHT FEMORAL-LEFT FEMORAL ARTERY;  Surgeon: Serafina Mitchell, MD;  Location: [facility];  Service: Vascular;  Laterality: Bilateral;  . FEMORAL-POPLITEAL BYPASS GRAFT    . PERIPHERAL VASCULAR CATHETERIZATION N/A 05/08/2016   Procedure: Lower Extremity Angiography;  Surgeon: Lorretta Harp, MD;  Location: [facility];  Service: Cardiovascular;  Laterality: N/A;  . PERIPHERAL VASCULAR INTERVENTION Right 09/19/2016   Procedure: Peripheral Vascular Intervention;  Surgeon: Serafina Mitchell, MD;  Location: [facility];  Service: Cardiovascular;  Laterality: Right;  ext iliac stent  . POLYPECTOMY  02/19/2018   Procedure: POLYPECTOMY;  Surgeon: Daneil Dolin, MD;  [facility];  Service: Endoscopy;;  . PROCTOSCOPY  10/24/2017   Procedure: PROCTOSCOPY;  Surgeon: Daneil Dolin, MD;  [facility];  Service: Endoscopy;;  . TEE WITHOUT CARDIOVERSION N/A 10/18/2015   Procedure: TRANSESOPHAGEAL ECHOCARDIOGRAM (TEE);  Surgeon: [name], MD;  Location: [facility];  Service: Open Heart Surgery;  Laterality: N/A;     Home Medications:  Prior to Admission medications   Medication Sig Start Date End Date Taking? Authorizing Provider  aspirin 81 MG tablet Take 81 mg by mouth daily.   Yes [provider]  cilostazol (PLETAL) 100 MG tablet Take 100 mg by mouth 2 (two) times daily.   Yes  [provider]  clopidogrel (PLAVIX) 75 MG tablet Take 75 mg by mouth daily.   Yes [provider]  ferrous sulfate 325 (65 FE) MG tablet Take 325 mg by mouth 2 (two) times daily with a meal.    Yes [provider]  gabapentin (NEURONTIN) 300 MG capsule Take 300 mg by mouth 3 (three) times daily.    Yes [provider]  insulin glargine (LANTUS) 100 UNIT/ML injection Inject 0.2 mLs (20 Units total) into the skin at bedtime. Patient taking differently: Inject 25 Units into the skin at bedtime.  08/03/17  Yes Isaac Bliss, Rayford Halsted, MD  metFORMIN (GLUCOPHAGE) 500 MG tablet Take 500 mg by mouth 2 (two) times daily with a meal.   Yes [provider]  metoprolol tartrate (LOPRESSOR) 25 MG tablet Take 25 mg by mouth 2 (two) times daily.   Yes [provider]  pantoprazole (PROTONIX) 40 MG tablet Take 1 tablet (40 mg total) by mouth daily. 08/19/17  Yes Dhungel, Nishant, MD  simvastatin (ZOCOR) 80 MG  tablet Take 40 mg by mouth at bedtime.    Yes [provider]  albuterol (PROVENTIL HFA;VENTOLIN HFA) 108 (90 Base) MCG/ACT inhaler Inhale 2 puffs into the lungs every 6 (six) hours as needed for wheezing or shortness of breath. 10/03/17   Kathie Dike, MD  Cholecalciferol (VITAMIN D) 2000 UNITS tablet Take 2,000 Units by mouth daily.    [provider]  guaiFENesin (MUCINEX) 600 MG 12 hr tablet Take 1 tablet (600 mg total) by mouth 2 (two) times daily. Patient not taking: Reported on 02/12/2018 10/03/17 10/03/18  Kathie Dike, MD  metoprolol tartrate (LOPRESSOR) 25 MG tablet TAKE 1 TABLET BY MOUTH TWICE DAILY 03/05/18   Almyra Deforest, PA  vitamin B-12 (CYANOCOBALAMIN) 1000 MCG tablet Take 1,000 mcg by mouth daily.    [provider]    Inpatient Medications: Scheduled Meds: . acetaminophen  1,000 mg Oral Q6H  . atorvastatin  40 mg Oral q1800  . ferrous sulfate  325 mg Oral BID WC  . gabapentin  300 mg Oral TID  . insulin  aspart  0-15 Units Subcutaneous Q4H  . mouth rinse  15 mL Mouth Rinse BID  . metoprolol tartrate  25 mg Oral BID  . pantoprazole  40 mg Oral Daily   Continuous Infusions: . dextrose 5 % and 0.9% NaCl 50 mL/hr at 03/28/18 1002   PRN Meds: albuterol, diphenhydrAMINE **OR** diphenhydrAMINE, fentaNYL (SUBLIMAZE) injection, hydrALAZINE, ondansetron **OR** ondansetron (ZOFRAN) IV, oxyCODONE, simethicone, traMADol  Allergies:   No Known Allergies  Social History:   Social History   Socioeconomic History  . Marital status: Married    Spouse name: Not on file  . Number of children: Not on file  . Years of education: Not on file  . Highest education level: Not on file  Occupational History  . Not on file  Social Needs  . Financial resource strain: Not on file  . Food insecurity:    Worry: Not on file    Inability: Not on file  . Transportation needs:    Medical: Not on file    Non-medical: Not on file  Tobacco Use  . Smoking status: Former Smoker    Packs/day: 2.00    Years: 30.00    Pack years: 60.00    Types: Cigarettes    Last attempt to quit: 08/14/2000    Years since quitting: 17.6  . Smokeless tobacco: Never Used  Substance and Sexual Activity  . Alcohol use: No  . Drug use: No  . Sexual activity: Not on file  Lifestyle  . Physical activity:    Days per week: Not on file    Minutes per session: Not on file  . Stress: Not on file  Relationships  . Social connections:    Talks on phone: Not on file    Gets together: Not on file    Attends religious service: Not on file    Active member of club or organization: Not on file    Attends meetings of clubs or organizations: Not on file    Relationship status: Not on file  . Intimate partner violence:    Fear of current or ex partner: Not on file    Emotionally abused: Not on file    Physically abused: Not on file    Forced sexual activity: Not on file  Other Topics Concern  . Not on file  Social History Narrative   . Not on file    Family History:    Family History  Problem Relation Age of Onset  . Heart attack Mother   . Stroke Mother   . Colon cancer Brother        late 12s     ROS:  Please see the history of present illness.  All other ROS reviewed and negative.     Physical Exam/Data:   Vitals:   03/28/18 1100 03/28/18 1122 03/28/18 1500 03/28/18 1600  BP: 132/62   (!) 158/78  Pulse: 77   79  Resp: 20   18  Temp:  97.9 F (36.6 C) (!) 97.4 F (36.3 C)   TempSrc:  Oral Oral   SpO2: 94%   93%  Weight:      Height:        Intake/Output Summary (Last 24 hours) at 03/28/2018 1712 Last data filed at 03/28/2018 1600 Gross per 24 hour  Intake 1741.7 ml  Output 2475 ml  Net -733.3 ml   Filed Weights   03/26/18 0811 03/26/18 2000  Weight: 69 kg 70.9 kg   VS:  BP (!) 167/66   Pulse 85   Temp (!) 97.4 F (36.3 C) (Oral)   Resp 18   Ht 5\' 6"  (1.676 m)   Wt 70.9 kg   SpO2 97%   BMI 25.23 kg/m  , BMI Body mass index is 25.23 kg/m. GENERAL:  Well appearing.  No acute distress. HEENT: Pupils equal round and reactive, fundi not visualized, oral mucosa unremarkable NECK:  No jugular venous distention, waveform within normal limits, carotid upstroke brisk and symmetric, no bruits LUNGS:  Clear to auscultation bilaterally HEART:  RRR.  PMI not displaced or sustained,S1 and S2 within normal limits, no S3, no S4, no clicks, no rubs, no murmurs ABD:  Flat, positive bowel sounds normal in frequency in pitch, no bruits, no rebound, no guarding, no midline pulsatile mass, no hepatomegaly, no splenomegaly EXT:  2 plus pulses throughout, no edema, no cyanosis no clubbing SKIN:  No rashes no nodules NEURO:  Cranial nerves II through XII grossly intact, motor grossly intact throughout PSYCH:  Cognitively intact, oriented to person place and time  EKG:  The EKG was personally reviewed and demonstrates:  Sinus rhythm.  Rate 75 bpm.  First degree AV block.  Telemetry:  Telemetry was  personally reviewed and demonstrates:  Sinus rhythm.  Sinus bradycardia.  No events.  Relevant CV Studies:  Echo 03/27/18: Study Conclusions  - Left ventricle: The cavity size was normal. There was mild focal   basal hypertrophy of the septum. Systolic function was normal.   The estimated ejection fraction was in the range of 55% to 60%.   Wall motion was normal; there were no regional wall motion   abnormalities. Left ventricular diastolic function parameters   were normal. - Right ventricle: The RV is not well visualized but appears   dilated with reduced RVF. - Pulmonary arteries: Systolic pressure could not be accurately   estimated.  Laboratory Data:  Chemistry Recent Labs  Lab 03/26/18 1901 03/27/18 0043 03/28/18 0007  NA 140 140 140  K 4.1 4.9 3.7  CL 106 108 108  CO2 23 21* 26  GLUCOSE 229* 232* 105*  BUN 18 22 22   CREATININE 1.40* 1.39* 1.19  CALCIUM 7.6* 7.5* 7.6*  GFRNONAA 49* 50* >60  GFRAA 57* 58* >60  ANIONGAP 11 11 6     Recent Labs  Lab 03/26/18 1901 03/27/18 0043 03/28/18 0007  PROT 4.1*  --  4.6*  ALBUMIN 2.5* 2.5* 2.5*  AST 15  --  15  ALT 11  --  11  ALKPHOS 40  --  37*  BILITOT 0.7  --  0.7   Hematology Recent Labs  Lab 03/27/18 1255 03/28/18 0007 03/28/18 1233  WBC 13.5* 9.5 9.6  RBC 2.90* 2.51* 3.02*  HGB 9.0* 7.8* 9.2*  HCT 26.1* 23.4* 28.0*  MCV 90.0 93.2 92.7  MCH 31.0 31.1 30.5  MCHC 34.5 33.3 32.9  RDW 14.2 14.9 15.6*  PLT 280 199 209   Cardiac Enzymes Recent Labs  Lab 03/26/18 1901 03/28/18 0007 03/28/18 0805  TROPONINI <0.03 0.04* 0.04*   No results for input(s): TROPIPOC in the last 168 hours.  BNP Recent Labs  Lab 03/26/18 2030  BNP 92.0    DDimer No results for input(s): DDIMER in the last 168 hours.  Radiology/Studies:  No results found.  Assessment and Plan:   # Elevated troponin: # CAD s/p PCI and CABG: Mr. Barb troponin is very minimally elevated to 0.04.  This occurred in the setting of a  10 point drop in his hemoglobin.  Despite this he has not had any chest pain and denies shortness of breath.  His EKG is without ischemic changes.  His echocardiogram reveals normal systolic function and no wall motion of normalities.  Therefore no further ischemia evaluation is needed at this time.  He would not be a candidate for cardiac catheterization at this point anyways given his anemia and bleeding.  Continue atorvastatin and metoprolol.  Resume aspirin, cilostazol, and clopidogrel when deemed safe per surgery.  Would start with just aspirin 81mg  daily.  Maintain hgb >8.  # Hypertension:  Hypotension in the setting of acute blood loss anemia has resolved.  We will allow permissive hypertension for today.  If BP remains elevated tomorrow would add an ACE-I/ARB or amlodipine.  # PAD: Resume antiplatelets when stable as above.    # Syncope:  Likely 2/2 acute blood loss and vagal.  Echo, EKG and telemetry unremarkable.  Symptoms have resolved so no further work up.  [facility] will sign off.   Medication Recommendations:  Resume antiplatelets when stable as above Other recommendations (labs, testing, etc):  Start ACE-I/ARB or amlodipine if BP >130/90 tomorrow.  Maintain hgb >8 Follow up as an outpatient:  As previously scheduled.    For questions or updates, please contact [facility] Please consult [email or web address] for contact info under Cardiology/STEMI.   Signed, Skeet Latch, MD  03/28/2018 5:12 PM

## 2018-03-29 ENCOUNTER — Inpatient Hospital Stay (HOSPITAL_COMMUNITY): Payer: Medicare Other

## 2018-03-29 LAB — BPAM RBC
BLOOD PRODUCT EXPIRATION DATE: 201909022359
Blood Product Expiration Date: 201909042359
Blood Product Expiration Date: 201909062359
ISSUE DATE / TIME: 201908140151
ISSUE DATE / TIME: 201908140921
ISSUE DATE / TIME: 201908150836
UNIT TYPE AND RH: 6200
UNIT TYPE AND RH: 6200
UNIT TYPE AND RH: 6200

## 2018-03-29 LAB — GLUCOSE, CAPILLARY
GLUCOSE-CAPILLARY: 91 mg/dL (ref 70–99)
Glucose-Capillary: 127 mg/dL — ABNORMAL HIGH (ref 70–99)
Glucose-Capillary: 234 mg/dL — ABNORMAL HIGH (ref 70–99)
Glucose-Capillary: 227 mg/dL — ABNORMAL HIGH (ref 70–99)
Glucose-Capillary: 167 mg/dL — ABNORMAL HIGH (ref 70–99)

## 2018-03-29 LAB — CBC
HCT: 27.2 % — ABNORMAL LOW (ref 39.0–52.0)
Hemoglobin: 8.8 g/dL — ABNORMAL LOW (ref 13.0–17.0)
MCH: 30 pg (ref 26.0–34.0)
MCHC: 32.4 g/dL (ref 30.0–36.0)
MCV: 92.8 fL (ref 78.0–100.0)
Platelets: 195 10*3/uL (ref 150–400)
RBC: 2.93 MIL/uL — ABNORMAL LOW (ref 4.22–5.81)
RDW: 15.5 % (ref 11.5–15.5)
WBC: 7.7 10*3/uL (ref 4.0–10.5)

## 2018-03-29 LAB — TYPE AND SCREEN
ABO/RH(D): A POS
ANTIBODY SCREEN: NEGATIVE
UNIT DIVISION: 0
UNIT DIVISION: 0
UNIT DIVISION: 0

## 2018-03-29 LAB — PHOSPHORUS: Phosphorus: 2.8 mg/dL (ref 2.5–4.6)

## 2018-03-29 LAB — MAGNESIUM: MAGNESIUM: 2.1 mg/dL (ref 1.7–2.4)

## 2018-03-29 MED ORDER — AMLODIPINE BESYLATE 5 MG PO TABS
5.0000 mg | ORAL_TABLET | Freq: Every day | ORAL | Status: DC
  Administered 2018-03-29: 5 mg via ORAL
  Filled 2018-03-29: qty 1

## 2018-03-29 NOTE — Progress Notes (Signed)
Orthopedic Tech Progress Note Patient Details:  [name] 10-24-1945 [id number]  Ortho Devices Type of Ortho Device: Abdominal binder Ortho Device/Splint Interventions: Application   Post Interventions Patient Tolerated: Well Instructions Provided: Care of device   [name] 03/29/2018, 12:06 PM

## 2018-03-29 NOTE — Progress Notes (Signed)
PROGRESS NOTE    DUTCH ING  YKD:[id number] DOB: [birth year] DOA: 03/26/2018 PCP: [name], MD    Brief Narrative: 72 year old male with PMH significant for but not limited to CAD/ CABG in 2017 on plavix and cilostazol , HTN, PVD, CKD, DM, neuropathy, GERD, HLD, and previous history of perforated sigmoid diverticulitis s/p Hartmann's procedure on 5/2 with benign surgical pathology. He has done well post-operatively.   Presented back for colostomy reversal..  A sigmoidoscopy was performed at the end of surgery to verify no bleeding at the staple line.  Patient states he has held his two antiplatelet meds for five days prior to surgery.  In PACU, patient began passing some clot and melena with istat Hgb 11.2; preop was 16.1.  Upon arrival to surgical floor, he had an episode where he became nauseated with probable vagal/ syncopal episode where he became unresponsive with some question of seizure activity due to shaking in his arms, however patient woke up and was not post-ictal.  He was hypotensive and therefore transferred to ICU.  EKG showing normal sinus rhythm with prolonged QTc at 0.489.  CCS at bedside to pack gel-foam into the rectum up to the anastomosis and 2 pack of platelets to be transfused ordered.  Patient received one PRBC 8-14. [facility] consulted by CCM to help with medical care.    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  Admitted 03-26-2018 for elective  colostomy reversal.  Management per surgery.   Post Operative Acute Blood loss anemia;  Hb this am at 7.8. Agree with another unit PRBC in setting of mild elevation troponin.  He had oly one small bloody BM yesterday.  Hb at 8.8 this am. No further bloody stool.  Continue with IV fluids.   Right side face paralysis ;  He was notice to have right face weakness today by nurse. Family report he had right face weakness, drooling since before 8-12.  Will get MRI rule out stroke.   Syncope;  ECHO; Normal Ef, no diastolic dysfunction.  Mild elevation of troponin. Suspect demand ischemia. Plan to transfuse one unit PRBC, keep hb above 8.  Cardiology consulted.  Suspect syncope was related to hypovolemia.   Hypotension; resolved, now hypertensive.  In setting of acute blood loss anemia.   HTN; continue with metoprolol.  Will add PRN hydralazine.  Start Norvasc today.   AKI; continue with IV fluids.   DM type 2;  CBG overnight and today 104 range. Hold on starting lantus. Poor oral intake.  Continue with SSI.   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 No anticoagulation in setting of post op bleeding.  Code Status: Full code.  Family Communication: Care discussed with patient.  Disposition Plan: Remain in the ICU.    Procedures: Colostomy reversal 8-13  Antimicrobials: none  Subjective: He denies chest pain, dyspnea. He is feeling better.  Nurse and family report, patient has been having right face  weakness, since before 8-12.   Objective: Vitals:   03/29/18 0300 03/29/18 0400 03/29/18 0500 03/29/18 0600  BP:  (!) 168/67  (!) 202/77  Pulse: (!) 55 (!) 56 (!) 55 60  Resp: (!) 9 14 (!) 9 15  Temp:  97.8 F (36.6 C)    TempSrc:  Oral    SpO2: 97% 95% 94% 95%  Weight:      Height:        Intake/Output Summary (Last 24 hours) at 03/29/2018 0711 Last data filed at 03/29/2018 0600 Gross per 24 hour  Intake 1901.9 ml  Output 3285 ml  Net  -1383.1 ml   Filed Weights   03/26/18 0811 03/26/18 2000  Weight: 69 kg 70.9 kg    Examination:  General exam: NAD Respiratory system: CTA Cardiovascular system: S 1, S 2 RRR Gastrointestinal system: BS present, soft, nt  Central nervous system: Alert, motor strength 5/5, right facial weakness.  Extremities: Symmetric 5 x 5 power. Skin: No rashes, lesions or ulcers Psychiatry: Judgement and insight appear normal. Mood & affect appropriate.     Data Reviewed: I have personally reviewed following labs and imaging studies  CBC: Recent Labs  Lab 03/27/18 0702 03/27/18 1255 03/28/18 0007 03/28/18 1233 03/29/18 0048  WBC 10.1 13.5* 9.5 9.6 7.7  NEUTROABS 7.8*  --  7.2 7.5  --   HGB 7.6* 9.0* 7.8* 9.2* 8.8*  HCT 23.4* 26.1* 23.4* 28.0* 27.2*  MCV 93.2 90.0 93.2 92.7 92.8  PLT 273 280 199 209 622   Basic Metabolic Panel: Recent Labs  Lab 03/26/18 1617 03/26/18 1901 03/27/18 0043 03/28/18 0007 03/29/18 0048  NA 140 140 140 140  --   K 3.9 4.1 4.9 3.7  --   CL  --  106 108 108  --   CO2  --  23 21* 26  --   GLUCOSE 197* 229* 232* 105*  --   BUN  --  18 22 22   --   CREATININE  --  1.40* 1.39* 1.19  --   CALCIUM  --  7.6* 7.5* 7.6*  --   MG  --  1.3* 2.0 1.9 2.1  PHOS  --   --  4.8* 2.7 2.8   GFR: Estimated Creatinine Clearance: 52.1 mL/min (by C-G formula based on SCr of 1.19 mg/dL). Liver Function Tests: Recent Labs  Lab 03/26/18 1901 03/27/18 0043 03/28/18 0007  AST 15  --  15  ALT 11  --  11  ALKPHOS 40  --  37*  BILITOT 0.7  --  0.7  PROT 4.1*  --  4.6*  ALBUMIN 2.5* 2.5* 2.5*   No results for input(s): LIPASE, AMYLASE in the last 168 hours. No results for input(s): AMMONIA in the last 168 hours. Coagulation Profile: Recent Labs  Lab 03/26/18 1901  INR 1.21   Cardiac Enzymes: Recent Labs  Lab 03/26/18 1901 03/28/18 0007 03/28/18 0805  TROPONINI <0.03 0.04* 0.04*   BNP (last 3 results) No results for input(s): PROBNP in the last 8760  hours. HbA1C: No results for input(s): HGBA1C in the last 72 hours. CBG: Recent Labs  Lab 03/28/18 1119 03/28/18 1535 03/28/18 1948 03/28/18 2331 03/29/18 0352  GLUCAP 174* 192* 186* 131* 91   Lipid Profile: No results for input(s): CHOL, HDL, LDLCALC, TRIG, CHOLHDL, LDLDIRECT in the last 72 hours. Thyroid Function Tests: No results for input(s): TSH, T4TOTAL, FREET4, T3FREE, THYROIDAB in the last 72 hours. Anemia Panel: No results for input(s): VITAMINB12, FOLATE, FERRITIN,  TIBC, IRON, RETICCTPCT in the last 72 hours. Sepsis Labs: Recent Labs  Lab 03/27/18 0043  PROCALCITON 0.51    Recent Results (from the past 240 hour(s))  Culture, blood (routine x 2)     Status: None (Preliminary result)   Collection Time: 03/26/18  8:15 PM  Result Value Ref Range Status   Specimen Description BLOOD RIGHT HAND  Final   Special Requests   Final    BOTTLES DRAWN AEROBIC ONLY Blood Culture results may not be optimal due to an inadequate volume of blood received in culture bottles   Culture   Final    NO GROWTH 2 DAYS Performed at [facility], [place] [address] [place], [place] [address]    Report Status PENDING  Incomplete  MRSA PCR Screening     Status: None   Collection Time: 03/26/18  9:56 PM  Result Value Ref Range Status   MRSA by PCR NEGATIVE NEGATIVE Final    Comment:        The GeneXpert MRSA Assay (FDA approved for NASAL specimens only), is one component of a comprehensive MRSA colonization surveillance program. It is not intended to diagnose MRSA infection nor to guide or monitor treatment for MRSA infections. Performed at [facility], [place] [address] [place], [place] [address]   Culture, blood (routine x 2)     Status: None (Preliminary result)   Collection Time: 03/27/18 12:53 AM  Result Value Ref Range Status   Specimen Description BLOOD RIGHT HAND  Final   Special Requests   Final    BOTTLES DRAWN AEROBIC ONLY Blood Culture results may not be  optimal due to an inadequate volume of blood received in culture bottles   Culture   Final    NO GROWTH 1 DAY Performed at [facility], [place] [address], [place],  [address]    Report Status PENDING  Incomplete         Radiology Studies: No results found.      Scheduled Meds: . acetaminophen  1,000 mg Oral Q6H  . amLODipine  5 mg Oral Daily  . atorvastatin  40 mg Oral q1800  . ferrous sulfate  325 mg Oral BID WC  . gabapentin  300 mg Oral TID  . insulin aspart  0-15 Units Subcutaneous Q4H  . metoprolol tartrate  25 mg Oral BID  . pantoprazole  40 mg Oral Daily   Continuous Infusions: . dextrose 5 % and 0.9% NaCl 10 mL/hr at 03/28/18 1900     LOS: 3 days    Time spent: 35 minutes.     Elmarie Shiley, MD [facility] Pager [phone]  If 7PM-7AM, please contact night-coverage [email or web address] Password [email or web address] 03/29/2018, 7:11 AM

## 2018-03-29 NOTE — Progress Notes (Signed)
3 Days Post-Op   Subjective/Chief Complaint: Tolerating carb modified diet - no nausea; limited appetite Three BM - mostly stool, some old blood No BM overnight Hgb up to 9.2 yesterday after 3rd unit of PRBC.  Drifted down to 8.8 this am Using only tylenol for pain   Objective: Vital signs in last 24 hours: Temp:  [97.4 F (36.3 C)-98 F (36.7 C)] 97.8 F (36.6 C) (08/16 0000) Pulse Rate:  [55-85] 60 (08/16 0600) Resp:  [6-20] 15 (08/16 0600) BP: (132-202)/(59-89) 202/77 (08/16 0600) SpO2:  [87 %-99 %] 95 % (08/16 0600) Last BM Date: 03/28/18  Intake/Output from previous day: 08/15 0701 - 08/16 0700 In: 1901.9 [P.O.:1013; I.V.:573.9; Blood:315] Out: 7322 [Urine:3285] Intake/Output this shift: Total I/O In: 329.3 [P.O.:240; I.V.:89.3] Out: 860 [Urine:860]  WDWN in NAD Abd - soft, minimal soreness around incision Midline incision c/d/i LLQ ostomy site - minimal drainage; no erythema Lab Results:  Recent Labs    03/28/18 1233 03/29/18 0048  WBC 9.6 7.7  HGB 9.2* 8.8*  HCT 28.0* 27.2*  PLT 209 195   BMET Recent Labs    03/27/18 0043 03/28/18 0007  NA 140 140  K 4.9 3.7  CL 108 108  CO2 21* 26  GLUCOSE 232* 105*  BUN 22 22  CREATININE 1.39* 1.19  CALCIUM 7.5* 7.6*   PT/INR Recent Labs    03/26/18 1901  LABPROT 15.2  INR 1.21   ABG No results for input(s): PHART, HCO3 in the last 72 hours.  Invalid input(s): PCO2, PO2  Studies/Results: No results found.  Anti-infectives: Anti-infectives (From admission, onward)   Start     Dose/Rate Route Frequency Ordered Stop   03/26/18 2300  cefoTEtan (CEFOTAN) 2 g in sodium chloride 0.9 % 100 mL IVPB     2 g 200 mL/hr over 30 Minutes Intravenous Every 12 hours 03/26/18 1606 03/27/18 0029   03/26/18 0900  cefoTEtan (CEFOTAN) 2 g in sodium chloride 0.9 % 100 mL IVPB     2 g 200 mL/hr over 30 Minutes Intravenous To ShortStay Surgical 03/25/18 1443 03/26/18 1125      Assessment/Plan: S/p colostomy  reversal with stapled EEA anastomosis 0/25/42 Complication - post-operative bleeding probably from anastomotic staple line and dysfunctional platelets; significantly improved after platelet transfusion.  Bowel movements with some old blood, but no apparent active bleeding Acute blood loss anemia - Hgb up to 9.0 after 2 u PRBC. Drifted back down to 7.8.  Transfuse 3rd unit of PRBC - up to 9.2.  8.8 this morning Troponin slightly elevated - Cardiology - no further work-up needed.   Carb modified diet OOB to chair Will order PT to help mobilize Appreciate [facility] assistance with medical management of BP and diabetes Hold Toradol - using scheduled Tylenol for pain control with good control Also, his creatinine is improved Awaiting stepdown bed  LOS: 3 days    [name] 03/29/2018

## 2018-03-29 NOTE — Progress Notes (Signed)
Nutrition Brief Note  Received MD Consult for assessment of nutrition status. S/P colostomy reversal with anastomosis 03/26/18.  Wt Readings from Last 15 Encounters:  03/26/18 70.9 kg  03/20/18 69 kg  02/19/18 67.6 kg  02/06/18 67.6 kg  12/17/17 71.1 kg  12/06/17 65.5 kg  11/27/17 66.7 kg  11/23/17 68.8 kg  10/24/17 69.4 kg  10/19/17 69.6 kg  10/02/17 74.6 kg  09/12/17 70 kg  09/10/17 70.8 kg  08/18/17 71.4 kg  08/01/17 72.7 kg    Body mass index is 25.23 kg/m. Patient meets criteria for normal weight based on current BMI.   No recent weight loss. Patient reports good appetite and good intake PTA and now. He had a poor appetite yesterday, but has improved today and ate all of his breakfast. Discussed the importance of adequate protein intake to promote healing s/p procedure.  Current diet order is CHO modified, patient is consuming approximately 100% of meals at this time. Labs and medications reviewed. Low albumin 2.5 reflective of acute inflammatory process associated with recent surgery.  No nutrition interventions warranted at this time. If nutrition issues arise, please consult RD.   Molli Barrows, RD, LDN, [place] Pager [phone] After Hours Pager [phone]

## 2018-03-29 NOTE — Progress Notes (Signed)
Wife expresses concern r/t perceived change in facial symmetry. Wife and pt confirm this was appreciated by them a couple of days before sx. Upon closer assessment, can appreciate that there is a mild asymmetry to mouth when asked to smile, barely detectable asymmetry when asked to raise eyebrows with right sided deficit. Strength continues to be equal in BUE and BLE. Dr Tyrell Antonio updated to all and to bs for further assessment.

## 2018-03-29 NOTE — Progress Notes (Signed)
PT Cancellation Note  Patient Details Name: ERICE AHLES MRN: [id number] DOB: 1945-11-18   Cancelled Treatment:    Reason Eval/Treat Not Completed: (P) Patient at procedure or test/unavailable Pt is experiencing mild facial droop and being taken for MRI. PT will follow back for evaluation tomorrow.   Violanda Bobeck B. Migdalia Dk PT, DPT Acute Rehabilitation  [phone] Pager [phone]  [place] 03/29/2018, 3:00 PM

## 2018-03-30 ENCOUNTER — Inpatient Hospital Stay: Payer: Self-pay

## 2018-03-30 LAB — HEMOGLOBIN AND HEMATOCRIT, BLOOD
HCT: 31.8 % — ABNORMAL LOW (ref 39.0–52.0)
Hemoglobin: 10.5 g/dL — ABNORMAL LOW (ref 13.0–17.0)

## 2018-03-30 LAB — BASIC METABOLIC PANEL
ANION GAP: 6 (ref 5–15)
BUN: 12 mg/dL (ref 8–23)
CO2: 27 mmol/L (ref 22–32)
Calcium: 8.5 mg/dL — ABNORMAL LOW (ref 8.9–10.3)
Chloride: 106 mmol/L (ref 98–111)
Creatinine, Ser: 1.02 mg/dL (ref 0.61–1.24)
GLUCOSE: 136 mg/dL — AB (ref 70–99)
POTASSIUM: 3.3 mmol/L — AB (ref 3.5–5.1)
Sodium: 139 mmol/L (ref 135–145)

## 2018-03-30 LAB — CBC
HCT: 32.4 % — ABNORMAL LOW (ref 39.0–52.0)
Hemoglobin: 10.6 g/dL — ABNORMAL LOW (ref 13.0–17.0)
MCH: 30.2 pg (ref 26.0–34.0)
MCHC: 32.7 g/dL (ref 30.0–36.0)
MCV: 92.3 fL (ref 78.0–100.0)
PLATELETS: 253 10*3/uL (ref 150–400)
RBC: 3.51 MIL/uL — ABNORMAL LOW (ref 4.22–5.81)
RDW: 14.6 % (ref 11.5–15.5)
WBC: 7.6 10*3/uL (ref 4.0–10.5)

## 2018-03-30 LAB — GLUCOSE, CAPILLARY
GLUCOSE-CAPILLARY: 198 mg/dL — AB (ref 70–99)
Glucose-Capillary: 126 mg/dL — ABNORMAL HIGH (ref 70–99)
Glucose-Capillary: 136 mg/dL — ABNORMAL HIGH (ref 70–99)
Glucose-Capillary: 184 mg/dL — ABNORMAL HIGH (ref 70–99)
Glucose-Capillary: 204 mg/dL — ABNORMAL HIGH (ref 70–99)

## 2018-03-30 MED ORDER — AMLODIPINE BESYLATE 10 MG PO TABS
10.0000 mg | ORAL_TABLET | Freq: Every day | ORAL | Status: DC
  Administered 2018-03-30 – 2018-04-01 (×3): 10 mg via ORAL
  Filled 2018-03-30 (×3): qty 1

## 2018-03-30 MED ORDER — VALACYCLOVIR HCL 500 MG PO TABS
1000.0000 mg | ORAL_TABLET | Freq: Three times a day (TID) | ORAL | Status: DC
  Administered 2018-03-30 – 2018-04-01 (×7): 1000 mg via ORAL
  Filled 2018-03-30 (×10): qty 2

## 2018-03-30 MED ORDER — POLYETHYLENE GLYCOL 3350 17 G PO PACK
17.0000 g | PACK | Freq: Every day | ORAL | Status: DC
  Administered 2018-03-30 – 2018-04-01 (×3): 17 g via ORAL
  Filled 2018-03-30 (×3): qty 1

## 2018-03-30 MED ORDER — INSULIN ASPART 100 UNIT/ML ~~LOC~~ SOLN
0.0000 [IU] | Freq: Three times a day (TID) | SUBCUTANEOUS | Status: DC
  Administered 2018-03-30 (×2): 3 [IU] via SUBCUTANEOUS
  Administered 2018-03-31: 2 [IU] via SUBCUTANEOUS
  Administered 2018-03-31 (×2): 3 [IU] via SUBCUTANEOUS
  Administered 2018-03-31: 5 [IU] via SUBCUTANEOUS
  Administered 2018-04-01: 8 [IU] via SUBCUTANEOUS
  Administered 2018-04-01: 3 [IU] via SUBCUTANEOUS

## 2018-03-30 NOTE — Evaluation (Signed)
Physical Therapy Evaluation Patient Details Name: [name] MRN: [id number] DOB: 05/26/1946 Today's Date: 03/30/2018   History of Present Illness  Pt is a 72 y/o male s/p colostomy reversal on 8/13. Pt with acute anemia following surgery as well as new onset R facial droop. MRI was negative for any acute abnormalities. PMH including but not limited to CAD/ CABG in 2017 on plavix and cilostazol , HTN, PVD, CKD, DM, neuropathy, GERD, HLD.  Clinical Impression  Pt presented supine in bed with HOB elevated, awake and willing to participate in therapy session. Prior to admission, pt reported that he was independent with all functional mobility and ADLs. Pt lives with his wife in a single level home with four steps to enter. Spouse reported that she is able to provide 24/7 supervision/assistance if needed. Pt currently able to perform bed mobility with supervision, transfers with min guard and ambulate in hallway with min guard without use of an AD. Pt with increased R lateral weight shift/sway during ambulation. Would like to assess higher level balance at next visit.   Pt would continue to benefit from skilled physical therapy services at this time while admitted and after d/c to address the below listed limitations in order to improve overall safety and independence with functional mobility.     Follow Up Recommendations Outpatient PT    Equipment Recommendations  None recommended by PT    Recommendations for Other Services       Precautions / Restrictions Precautions Precautions: Fall Restrictions Weight Bearing Restrictions: No      Mobility  Bed Mobility Overal bed mobility: Needs Assistance Bed Mobility: Supine to Sit     Supine to sit: Supervision     General bed mobility comments: increased time, supervision for safety  Transfers Overall transfer level: Needs assistance Equipment used: None Transfers: Sit to/from Stand Sit to Stand: Min guard         General  transfer comment: min guard for safety; pt stable with transition  Ambulation/Gait Ambulation/Gait assistance: Min guard Gait Distance (Feet): 150 Feet Assistive device: None Gait Pattern/deviations: Step-through pattern;Decreased step length - right;Decreased step length - left;Decreased stride length;Drifts right/left Gait velocity: decreased Gait velocity interpretation: 1.31 - 2.62 ft/sec, indicative of limited community ambulator General Gait Details: pt with increased lateral sway towards his R during gait and occasionally bumping into obstacles on R; no overt LOB or need for physical assistance, close min guard for safety; all VSS throughout  Stairs            Wheelchair Mobility    Modified Rankin (Stroke Patients Only)       Balance Overall balance assessment: Needs assistance Sitting-balance support: Feet supported Sitting balance-Leahy Scale: Good     Standing balance support: During functional activity;No upper extremity supported Standing balance-Leahy Scale: Fair                               Pertinent Vitals/Pain Pain Assessment: No/denies pain    Home Living Family/patient expects to be discharged to:: Private residence Living Arrangements: Spouse/significant other Available Help at Discharge: Family;Available 24 hours/day Type of Home: House Home Access: Stairs to enter Entrance Stairs-Rails: Right;Left;Can reach both Entrance Stairs-Number of Steps: 4 Home Layout: One level Home Equipment: Clinical cytogeneticist - 2 wheels;Cane - single point      Prior Function Level of Independence: Independent  Hand Dominance        Extremity/Trunk Assessment   Upper Extremity Assessment Upper Extremity Assessment: Overall WFL for tasks assessed    Lower Extremity Assessment Lower Extremity Assessment: Overall WFL for tasks assessed       Communication   Communication: No difficulties  Cognition Arousal/Alertness:  Awake/alert Behavior During Therapy: WFL for tasks assessed/performed Overall Cognitive Status: Within Functional Limits for tasks assessed                                        General Comments      Exercises     Assessment/Plan    PT Assessment Patient needs continued PT services  PT Problem List Decreased balance;Decreased mobility;Decreased coordination       PT Treatment Interventions DME instruction;Gait training;Stair training;Functional mobility training;Therapeutic activities;Therapeutic exercise;Balance training;Neuromuscular re-education;Patient/family education    PT Goals (Current goals can be found in the Care Plan section)  Acute Rehab PT Goals Patient Stated Goal: get back to normal PT Goal Formulation: With patient/family Time For Goal Achievement: 04/13/18 Potential to Achieve Goals: Good    Frequency Min 3X/week   Barriers to discharge        Co-evaluation               AM-PAC PT "6 Clicks" Daily Activity  Outcome Measure Difficulty turning over in bed (including adjusting bedclothes, sheets and blankets)?: None Difficulty moving from lying on back to sitting on the side of the bed? : None Difficulty sitting down on and standing up from a chair with arms (e.g., wheelchair, bedside commode, etc,.)?: A Little Help needed moving to and from a bed to chair (including a wheelchair)?: None Help needed walking in hospital room?: None Help needed climbing 3-5 steps with a railing? : A Little 6 Click Score: 22    End of Session Equipment Utilized During Treatment: Gait belt Activity Tolerance: Patient tolerated treatment well Patient left: in chair;with call bell/phone within reach;with family/visitor present;Other (comment)(SLP entering to evaluate pt) Nurse Communication: Mobility status PT Visit Diagnosis: Other abnormalities of gait and mobility (R26.89)    Time: 0950-1002 PT Time Calculation (min) (ACUTE ONLY): 12  min   Charges:   PT Evaluation $PT Eval Moderate Complexity: [place], [place], [place] 03/30/2018, 12:08 PM

## 2018-03-30 NOTE — Progress Notes (Signed)
PROGRESS NOTE    [name]  GUR:[id number] DOB: [birth year] DOA: 03/26/2018 PCP: [name], MD    Brief Narrative: 72 year old male with PMH significant for but not limited to CAD/ CABG in 2017 on plavix and cilostazol , HTN, PVD, CKD, DM, neuropathy, GERD, HLD, and previous history of perforated sigmoid diverticulitis s/p Hartmann's procedure on 5/2 with benign surgical pathology. He has done well post-operatively.   Presented back for colostomy reversal..  A sigmoidoscopy was performed at the end of surgery to verify no bleeding at the staple line.  Patient states he has held his two antiplatelet meds for five days prior to surgery.  In PACU, patient began passing some clot and melena with istat Hgb 11.2; preop was 16.1.  Upon arrival to surgical floor, he had an episode where he became nauseated with probable vagal/ syncopal episode where he became unresponsive with some question of seizure activity due to shaking in his arms, however patient woke up and was not post-ictal.  He was hypotensive and therefore transferred to ICU.  EKG showing normal sinus rhythm with prolonged QTc at 0.489.  CCS at bedside to pack gel-foam into the rectum up to the anastomosis and 2 pack of platelets to be transfused ordered.  Patient received one PRBC 8-14. [facility] consulted by CCM to help with medical care.    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  Admitted 03-26-2018 for elective  colostomy reversal.  Management per surgery.   Post Operative Acute Blood loss anemia;  Hb this am at 7.8. Agree with another unit PRBC in setting of mild elevation troponin.  He had oly one small bloody BM yesterday.  No further bloody stool.  Continue with IV fluids.  Awaiting hb for the morning.   Right side facial  paralysis ; Bell's palsy; He was notice to have right face weakness today by nurse. Family report he had right face weakness, drooling since before 8-12. MRI negative for stroke.  Discussed with neurology, we can use prednisone and acyclovir for 7 days. He recommended to discussed with surgeon regarding prednisone use due to recent sx. Discussed with DR Rosendo Gros, would like to hold on steroids due to recent sx.   Syncope;  ECHO; Normal Ef, no diastolic dysfunction.  Mild elevation of troponin. Suspect demand ischemia. Plan to transfuse one unit PRBC, keep hb above 8.  Cardiology consulted.  Suspect syncope was related to hypovolemia.  Resolved.   Hypotension; resolved, now hypertensive.  In setting of acute blood loss anemia.   HTN; continue with metoprolol.  Will add PRN hydralazine.  Will increase Norvasc to 10 mg daily.   AKI; improved. NSL.   DM type 2;  CBG overnight and today 104 range. Hold on starting lantus. Poor oral intake.  Continue with SSI.   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 No anticoagulation in setting of post op bleeding.  Code Status: Full code.  Family  Communication: Care discussed with patient.  Disposition Plan: Remain in the ICU.    Procedures: Colostomy reversal 8-13  Antimicrobials: none  Subjective: He is feeling ok, denies chest pain.  Report right eye lacrimation and burning.  He denies BM last 2 days.    Objective: Vitals:   03/30/18 0327 03/30/18 0400 03/30/18 0500 03/30/18 0600  BP:  (!) 149/84 (!) 173/75 (!) 152/101  Pulse:  (!) 56 60 (!) 55  Resp:  16 17 10   Temp: 97.8 F  (36.6 C)     TempSrc: Oral     SpO2:  97% 96% 98%  Weight:      Height:        Intake/Output Summary (Last 24 hours) at 03/30/2018 0801 Last data filed at 03/30/2018 0500 Gross per 24 hour  Intake 970 ml  Output 3325 ml  Net -2355 ml   Filed Weights   03/26/18 0811 03/26/18 2000  Weight: 69 kg 70.9 kg    Examination:  General exam: NAD Respiratory system: CTA Cardiovascular system: S 1, S 2 RRR Gastrointestinal system: BS present, soft, nt Central nervous system: right side facial paralysis.  Extremities: Symmetric power. Skin: no rash    Data Reviewed: I have personally reviewed following labs and imaging studies  CBC: Recent Labs  Lab 03/27/18 0702 03/27/18 1255 03/28/18 0007 03/28/18 1233 03/29/18 0048  WBC 10.1 13.5* 9.5 9.6 7.7  NEUTROABS 7.8*  --  7.2 7.5  --   HGB 7.6* 9.0* 7.8* 9.2* 8.8*  HCT 23.4* 26.1* 23.4* 28.0* 27.2*  MCV 93.2 90.0 93.2 92.7 92.8  PLT 273 280 199 209 494   Basic Metabolic Panel: Recent Labs  Lab 03/26/18 1617 03/26/18 1901 03/27/18 0043 03/28/18 0007 03/29/18 0048  NA 140 140 140 140  --   K 3.9 4.1 4.9 3.7  --   CL  --  106 108 108  --   CO2  --  23 21* 26  --   GLUCOSE 197* 229* 232* 105*  --   BUN  --  18 22 22   --   CREATININE  --  1.40* 1.39* 1.19  --   CALCIUM  --  7.6* 7.5* 7.6*  --   MG  --  1.3* 2.0 1.9 2.1  PHOS  --   --  4.8* 2.7 2.8   GFR: Estimated Creatinine Clearance: 52.1 mL/min (by C-G formula based on SCr of 1.19 mg/dL). Liver Function Tests: Recent Labs  Lab 03/26/18 1901 03/27/18 0043 03/28/18 0007  AST 15  --  15  ALT 11  --  11  ALKPHOS 40  --  37*  BILITOT 0.7  --  0.7  PROT 4.1*  --  4.6*  ALBUMIN 2.5* 2.5* 2.5*   No results for input(s): LIPASE, AMYLASE in the last 168 hours. No results for input(s): AMMONIA in the last 168 hours. Coagulation Profile: Recent Labs  Lab 03/26/18 1901  INR 1.21   Cardiac Enzymes: Recent Labs  Lab 03/26/18 1901 03/28/18 0007 03/28/18 0805    TROPONINI <0.03 0.04* 0.04*   BNP (last 3 results) No results for input(s): PROBNP in the last 8760 hours. HbA1C: No results for input(s): HGBA1C in the last 72 hours. CBG: Recent Labs  Lab 03/29/18 1211 03/29/18 1625 03/29/18 1946 03/29/18 2346 03/30/18 0319  GLUCAP 234* 227* 167* 204* 136*   Lipid Profile: No results for input(s): CHOL, HDL, LDLCALC, TRIG, CHOLHDL, LDLDIRECT in the last 72 hours. Thyroid Function Tests: No results for  input(s): TSH, T4TOTAL, FREET4, T3FREE, THYROIDAB in the last 72 hours. Anemia Panel: No results for input(s): VITAMINB12, FOLATE, FERRITIN, TIBC, IRON, RETICCTPCT in the last 72 hours. Sepsis Labs: Recent Labs  Lab 03/27/18 0043  PROCALCITON 0.51    Recent Results (from the past 240 hour(s))  Culture, blood (routine x 2)     Status: None (Preliminary result)   Collection Time: 03/26/18  8:15 PM  Result Value Ref Range Status   Specimen Description BLOOD RIGHT HAND  Final   Special Requests   Final    BOTTLES DRAWN AEROBIC ONLY Blood Culture results may not be optimal due to an inadequate volume of blood received in culture bottles   Culture   Final    NO GROWTH 3 DAYS Performed at [facility], [place] [address], [place], [place] [address]    Report Status PENDING  Incomplete  MRSA PCR Screening     Status: None   Collection Time: 03/26/18  9:56 PM  Result Value Ref Range Status   MRSA by PCR NEGATIVE NEGATIVE Final    Comment:        The GeneXpert MRSA Assay (FDA approved for NASAL specimens only), is one component of a comprehensive MRSA colonization surveillance program. It is not intended to diagnose MRSA infection nor to guide or monitor treatment for MRSA infections. Performed at [facility], [place] [address] [place], [place] [address]   Culture, blood (routine x 2)     Status: None (Preliminary result)   Collection Time: 03/27/18 12:53 AM  Result Value Ref Range Status   Specimen Description BLOOD RIGHT  HAND  Final   Special Requests   Final    BOTTLES DRAWN AEROBIC ONLY Blood Culture results may not be optimal due to an inadequate volume of blood received in culture bottles   Culture   Final    NO GROWTH 2 DAYS Performed at [facility], [place] [address] [place], [place] [address]    Report Status PENDING  Incomplete         Radiology Studies: Mr Brain Wo Contrast  Result Date: 03/29/2018 CLINICAL DATA:  Facial paralysis EXAM: MRI HEAD WITHOUT CONTRAST TECHNIQUE: Multiplanar, multiecho pulse sequences of the brain and surrounding structures were obtained without intravenous contrast. COMPARISON:  CT head 08/17/2017 FINDINGS: Brain: Moderate atrophy. Negative for acute infarct. Minimal chronic ischemic change in the white matter. Negative for hemorrhage or mass. Negative for midline shift. Vascular: Normal arterial flow voids Skull and upper cervical spine: Negative Sinuses/Orbits: Mild mucosal edema in the paranasal sinuses and mastoid sinus bilaterally. Normal orbit Other: None IMPRESSION: Moderate atrophy.  No acute intracranial abnormality. Electronically Signed   By: Franchot Gallo M.D.   On: 03/29/2018 16:13        Scheduled Meds: . acetaminophen  1,000 mg Oral Q6H  . amLODipine  10 mg Oral Daily  . atorvastatin  40 mg Oral q1800  . ferrous sulfate  325 mg Oral BID WC  . gabapentin  300 mg Oral TID  . insulin aspart  0-15 Units Subcutaneous Q4H  . metoprolol tartrate  25 mg Oral BID  . pantoprazole  40 mg Oral Daily   Continuous Infusions:    LOS: 4 days    Time spent: 35 minutes.     Elmarie Shiley, MD [facility] Pager [phone]  If 7PM-7AM, please contact night-coverage [email or web address] Password [email or web address] 03/30/2018, 8:01 AM

## 2018-03-30 NOTE — Evaluation (Signed)
Clinical/Bedside Swallow Evaluation Patient Details  Name: [name] MRN: [id number] Date of Birth: 05-13-1946  Today's Date: 03/30/2018 Time: SLP Start Time (ACUTE ONLY): 1004 SLP Stop Time (ACUTE ONLY): 1021 SLP Time Calculation (min) (ACUTE ONLY): 17 min  Past Medical History:  Past Medical History:  Diagnosis Date  . CAD S/P percutaneous coronary angioplasty 1998   PCI TO CX  . CKD (chronic kidney disease) stage 3, GFR 30-59 ml/min (HCC)   . Diabetes mellitus without complication ([place])   . Diverticulitis 08/16/2017   hospitalized with diverticulitis/sepsis  . GERD (gastroesophageal reflux disease)    occassionally  . 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 Hypercholesteremia   . Hypertension   . Neuropathy   . Peripheral vascular disease ([place])    s/p R-L FEM-FEM BYPASS  . Pneumonia   . Tachycardia    after CABG, pt on medicine for this   Past Surgical History:  Past Surgical History:  Procedure Laterality Date  . ABDOMINAL AORTOGRAM Bilateral 09/19/2016   Procedure: iliac;  Surgeon: Serafina Mitchell, MD;  Location: [facility];  Service: Cardiovascular;  Laterality: Bilateral;  . CARDIAC CATHETERIZATION  2003   with stent  . CARDIAC CATHETERIZATION N/A 10/11/2015   Procedure: Left Heart Cath and Coronary Angiography;  Surgeon: Peter M Martinique, MD;  Location: [place] [facility];  Service: Cardiovascular;  Laterality: N/A;  . COLON RESECTION N/A 12/13/2017   Procedure: EXPLORATORY LAPAROTOMY, SIGMOID COLECTOMY WITH COLOSTOMY;  Surgeon: Donnie Mesa, MD;  Location: [facility] [place];  Service: General;  Laterality: N/A;  . COLONOSCOPY N/A 09/22/2013   Procedure: COLONOSCOPY;  Surgeon: Daneil Dolin, MD;  [facility];  Service: Endoscopy;  Laterality: N/A;  9:30 AM  . COLONOSCOPY N/A 02/19/2018   Procedure: COLONOSCOPY;  Surgeon: Daneil Dolin, MD;  [facility];  Service: Endoscopy;  Laterality: N/A;  2:00pm  . COLOSTOMY N/A 12/13/2017   Procedure: COLOSTOMY;   Surgeon: Donnie Mesa, MD;  Location: [facility];  Service: General;  Laterality: N/A;  . COLOSTOMY REVERSAL N/A 03/26/2018   Procedure: COLOSTOMY REVERSAL;  Surgeon: Donnie Mesa, MD;  Location: [facility];  Service: General;  Laterality: N/A;  . CORONARY ARTERY BYPASS GRAFT N/A 10/18/2015   Procedure: CORONARY ARTERY BYPASS GRAFTING (CABG) x  five, using left internal mammary artery and right leg greater saphenous vein harvested endoscopically;  Surgeon: [name], MD;  Location: [facility];  Service: Open Heart Surgery;  Laterality: N/A;  . ENDARTERECTOMY FEMORAL Right 08/09/2016   Procedure: ENDARTERECTOMY FEMORAL WITH VEIN PATCH ANGIOPLASTY;  Surgeon: Serafina Mitchell, MD;  Location: [facility];  Service: Vascular;  Laterality: Right;  . ESOPHAGOGASTRODUODENOSCOPY N/A 10/24/2017   Dr. Gala Romney: hiatal hernia  . FEMORAL-FEMORAL BYPASS GRAFT Bilateral 08/09/2016   Procedure: REVISION BYPASS GRAFT RIGHT FEMORAL-LEFT FEMORAL ARTERY;  Surgeon: Serafina Mitchell, MD;  Location: [facility];  Service: Vascular;  Laterality: Bilateral;  . FEMORAL-POPLITEAL BYPASS GRAFT    . PERIPHERAL VASCULAR CATHETERIZATION N/A 05/08/2016   Procedure: Lower Extremity Angiography;  Surgeon: Lorretta Harp, MD;  Location: [place] [facility];  Service: Cardiovascular;  Laterality: N/A;  . PERIPHERAL VASCULAR INTERVENTION Right 09/19/2016   Procedure: Peripheral Vascular Intervention;  Surgeon: Serafina Mitchell, MD;  Location: [facility];  Service: Cardiovascular;  Laterality: Right;  ext iliac stent  . POLYPECTOMY  02/19/2018   Procedure: POLYPECTOMY;  Surgeon: Daneil Dolin, MD;  [facility];  Service:  Endoscopy;;  . PROCTOSCOPY  10/24/2017   Procedure: PROCTOSCOPY;  Surgeon: Daneil Dolin, MD;  [facility];  Service: Endoscopy;;  . TEE WITHOUT CARDIOVERSION N/A 10/18/2015   Procedure: TRANSESOPHAGEAL ECHOCARDIOGRAM (TEE);  Surgeon: [name], MD;  Location: [facility];  Service: Open Heart Surgery;   Laterality: N/A;   HPI:  72 year old male with PMH significant for but not limited to CAD/ CABG in 2017 on plavix and cilostazol , HTN, PVD, CKD, DM, neuropathy, GERD, HLD, and previous history of perforated sigmoid diverticulitis s/p Hartmann's procedure on 5/2 with benign surgical pathology. He has done well post-operatively. Presented back for colostomy reversal with stapled EEA anastomosis 1/95/09, post op complications including bleeding probably from anastomotic staple line and dysfunctional platelets; significantly improved after platelet transfusion, and an episode where he became nauseated with probable vagal/ syncopal episode where he became unresponsive with some question of seizure activity due to shaking in his arms, however patient woke up and was not post-ictal.  He was hypotensive and therefore transferred to ICU.  Also with right sided facial droop. Family report he had right face weakness, drooling since before 8-12. MRI negative for stroke. Dx with Bells palsy, being treated with steroids. Patient with a recent barium swallow complete (after being admitted for bilateral PNA) 10/26/2017, normal except for prominent CP.    Assessment / Plan / Recommendation Clinical Impression  Patient presents with a mild oropharyngeal dysphagia, likely resulting from what MD believes is Bell's palsy impacting right sided facial strength and ROM. Mastication mildly impacted with slight right sided buccal pocketing for which patient compensating by directing bolus to left buccal cavity. Intermittent wet vocal quality noted at baseline as well as during po intake. Possible pharyngeal weakness (unknown origin) or impact of decreased oral control wtih loss of bolus into pharynx during mastication,  with resultant pharyngeal residue and possible penetration and/or aspiration. Patient however appears to be adequately compensating with combination of spontaneous and cued throat clear and swallow which effectively  clears vocal quality. Patient additionally cognitively intact and mobile, increasing ability to handle a small amount of aspiration from a pulmonary standpoint. Discussed with patient and spouse. Recommend continuation of a regular diet, thin liquid with aspiration precautions and compensatory strategies. Patient is being treated for Bell's palsy. Hopeful for improvement in function over time. SLP will monitor however for use of compensatory strategies and need from instrumental study pending continued difficulty.  SLP Visit Diagnosis: Dysphagia, oropharyngeal phase (R13.12)    Aspiration Risk  Moderate aspiration risk    Diet Recommendation Regular;Thin liquid   Liquid Administration via: Cup;Straw Medication Administration: Whole meds with liquid Supervision: Patient able to self feed;Intermittent supervision to cue for compensatory strategies Compensations: Slow rate;Small sips/bites;Clear throat intermittently Postural Changes: Seated upright at 90 degrees    Other  Recommendations Oral Care Recommendations: Oral care BID   Follow up Recommendations (TBD)      Frequency and Duration min 2x/week  2 weeks           Swallow Study   General HPI: 72 year old male with PMH significant for but not limited to CAD/ CABG in 2017 on plavix and cilostazol , HTN, PVD, CKD, DM, neuropathy, GERD, HLD, and previous history of perforated sigmoid diverticulitis s/p Hartmann's procedure on 5/2 with benign surgical pathology. He has done well post-operatively. Presented back for colostomy reversal with stapled EEA anastomosis 11/06/69, post op complications including bleeding probably from anastomotic staple line and dysfunctional platelets; significantly  improved after platelet transfusion, and an episode where he became nauseated with probable vagal/ syncopal episode where he became unresponsive with some question of seizure activity due to shaking in his arms, however patient woke up and was not  post-ictal.  He was hypotensive and therefore transferred to ICU.  Also with right sided facial droop. Family report he had right face weakness, drooling since before 8-12. MRI negative for stroke. Dx with Bells palsy, being treated with steroids. Patient with a recent barium swallow complete (after being admitted for bilateral PNA) 10/26/2017, normal except for prominent CP.  Type of Study: Bedside Swallow Evaluation Previous Swallow Assessment: see HPI Diet Prior to this Study: Regular;Thin liquids Temperature Spikes Noted: No Respiratory Status: Room air History of Recent Intubation: No Behavior/Cognition: Alert;Cooperative;Pleasant mood Oral Cavity Assessment: Within Functional Limits Oral Care Completed by SLP: Recent completion by staff Oral Cavity - Dentition: Missing dentition;Poor condition Vision: Functional for self-feeding Self-Feeding Abilities: Able to feed self Patient Positioning: Upright in chair Baseline Vocal Quality: Wet Volitional Cough: Strong Volitional Swallow: Able to elicit    Oral/Motor/Sensory Function Overall Oral Motor/Sensory Function: Moderate impairment Facial ROM: Reduced right;Suspected CN VII (facial) dysfunction Facial Symmetry: Abnormal symmetry right;Suspected CN VII (facial) dysfunction Facial Strength: Reduced right;Suspected CN VII (facial) dysfunction Facial Sensation: Within Functional Limits Lingual ROM: Reduced right Lingual Symmetry: Within Functional Limits Lingual Strength: Within Functional Limits Lingual Sensation: Within Functional Limits Velum: Within Functional Limits Mandible: Within Functional Limits   Ice Chips Ice chips: Not tested   Thin Liquid Thin Liquid: Impaired Presentation: Cup;Straw;Self Fed Pharyngeal  Phase Impairments: Wet Vocal Quality    Nectar Thick Nectar Thick Liquid: Not tested   Honey Thick Honey Thick Liquid: Not tested   Puree Puree: Within functional limits Presentation: Spoon;Self Fed   Solid      Solid: Impaired Presentation: Self Fed Oral Phase Functional Implications: Right lateral sulci pocketing(mild) Pharyngeal Phase Impairments: Wet Vocal Quality     Boyde Grieco MA, CCC-SLP   Virgina Deakins Meryl 03/30/2018,10:37 AM

## 2018-03-30 NOTE — Progress Notes (Addendum)
4 Days Post-Op   Subjective/Chief Complaint: Pt's MRI results reviewed. Pt con't with R sided facial weakness No BMs yesterday. Labs pending this AM   Objective: Vital signs in last 24 hours: Temp:  [97.3 F (36.3 C)-98.1 F (36.7 C)] 97.8 F (36.6 C) (08/17 0327) Pulse Rate:  [55-84] 55 (08/17 0600) Resp:  [7-19] 10 (08/17 0600) BP: (123-183)/(58-101) 152/101 (08/17 0600) SpO2:  [88 %-98 %] 98 % (08/17 0600) Last BM Date: 03/28/18  Intake/Output from previous day: 08/16 0701 - 08/17 0700 In: 980 [P.O.:900; I.V.:80] Out: 3875 [Urine:3875] Intake/Output this shift: No intake/output data recorded.  General appearance: alert and cooperative GI: soft, non-tender; bowel sounds normal; no masses,  no organomegaly and incision c/d/i  Lab Results:  Recent Labs    03/28/18 1233 03/29/18 0048  WBC 9.6 7.7  HGB 9.2* 8.8*  HCT 28.0* 27.2*  PLT 209 195   BMET Recent Labs    03/28/18 0007  NA 140  K 3.7  CL 108  CO2 26  GLUCOSE 105*  BUN 22  CREATININE 1.19  CALCIUM 7.6*   PT/INR No results for input(s): LABPROT, INR in the last 72 hours. ABG No results for input(s): PHART, HCO3 in the last 72 hours.  Invalid input(s): PCO2, PO2  Studies/Results: Mr Brain Wo Contrast  Result Date: 03/29/2018 CLINICAL DATA:  Facial paralysis EXAM: MRI HEAD WITHOUT CONTRAST TECHNIQUE: Multiplanar, multiecho pulse sequences of the brain and surrounding structures were obtained without intravenous contrast. COMPARISON:  CT head 08/17/2017 FINDINGS: Brain: Moderate atrophy. Negative for acute infarct. Minimal chronic ischemic change in the white matter. Negative for hemorrhage or mass. Negative for midline shift. Vascular: Normal arterial flow voids Skull and upper cervical spine: Negative Sinuses/Orbits: Mild mucosal edema in the paranasal sinuses and mastoid sinus bilaterally. Normal orbit Other: None IMPRESSION: Moderate atrophy.  No acute intracranial abnormality. Electronically  Signed   By: Franchot Gallo M.D.   On: 03/29/2018 16:13    Anti-infectives: Anti-infectives (From admission, onward)   Start     Dose/Rate Route Frequency Ordered Stop   03/26/18 2300  cefoTEtan (CEFOTAN) 2 g in sodium chloride 0.9 % 100 mL IVPB     2 g 200 mL/hr over 30 Minutes Intravenous Every 12 hours 03/26/18 1606 03/27/18 0029   03/26/18 0900  cefoTEtan (CEFOTAN) 2 g in sodium chloride 0.9 % 100 mL IVPB     2 g 200 mL/hr over 30 Minutes Intravenous To ShortStay Surgical 03/25/18 1443 03/26/18 1125      Assessment/Plan: S/p colostomy reversal with stapled EEA anastomosis 0/99/83 Complication - post-operative bleeding probably from anastomotic staple line and dysfunctional platelets; significantly improved after platelet transfusion. No BM's overnight Acute blood loss anemia - hgb stabilizing, 10.6 today Troponin slightly elevated - Cardiology - no further work-up needed.  Carb modified diet OOB to chair Will order PT to help mobilize Appreciate TRH assistance with medical management of BP and diabetes, Bell's palsy-starting acyclovir Hold Toradol - using scheduled Tylenol for pain control with good control Also, his creatinine isimproved Will transfer to 6N only   LOS: 4 days    Rosario Jacks., [facility] 03/30/2018

## 2018-03-31 ENCOUNTER — Other Ambulatory Visit: Payer: Self-pay

## 2018-03-31 LAB — CBC
HCT: 33.5 % — ABNORMAL LOW (ref 39.0–52.0)
Hemoglobin: 10.9 g/dL — ABNORMAL LOW (ref 13.0–17.0)
MCH: 30.3 pg (ref 26.0–34.0)
MCHC: 32.5 g/dL (ref 30.0–36.0)
MCV: 93.1 fL (ref 78.0–100.0)
PLATELETS: 306 10*3/uL (ref 150–400)
RBC: 3.6 MIL/uL — AB (ref 4.22–5.81)
RDW: 14.8 % (ref 11.5–15.5)
WBC: 7.8 10*3/uL (ref 4.0–10.5)

## 2018-03-31 LAB — BASIC METABOLIC PANEL
ANION GAP: 9 (ref 5–15)
BUN: 13 mg/dL (ref 8–23)
CO2: 25 mmol/L (ref 22–32)
Calcium: 8.7 mg/dL — ABNORMAL LOW (ref 8.9–10.3)
Chloride: 104 mmol/L (ref 98–111)
Creatinine, Ser: 1.04 mg/dL (ref 0.61–1.24)
GFR calc Af Amer: >60 mL/min (ref >60)
GFR calc non Af Amer: >60 mL/min (ref >60)
Glucose, Bld: 175 mg/dL — ABNORMAL HIGH (ref 70–99)
POTASSIUM: 3.9 mmol/L (ref 3.5–5.1)
Sodium: 138 mmol/L (ref 135–145)

## 2018-03-31 LAB — GLUCOSE, CAPILLARY
GLUCOSE-CAPILLARY: 163 mg/dL — AB (ref 70–99)
GLUCOSE-CAPILLARY: 175 mg/dL — AB (ref 70–99)
Glucose-Capillary: 206 mg/dL — ABNORMAL HIGH (ref 70–99)
Glucose-Capillary: 140 mg/dL — ABNORMAL HIGH (ref 70–99)

## 2018-03-31 LAB — CULTURE, BLOOD (ROUTINE X 2): CULTURE: NO GROWTH

## 2018-03-31 MED ORDER — GLUCERNA SHAKE PO LIQD
237.0000 mL | Freq: Two times a day (BID) | ORAL | Status: DC
  Administered 2018-04-01: 237 mL via ORAL

## 2018-03-31 MED ORDER — POTASSIUM CHLORIDE CRYS ER 20 MEQ PO TBCR
40.0000 meq | EXTENDED_RELEASE_TABLET | Freq: Once | ORAL | Status: AC
  Administered 2018-03-31: 40 meq via ORAL
  Filled 2018-03-31: qty 2

## 2018-03-31 NOTE — Progress Notes (Signed)
[facility] Progress Note  5 Days Post-Op  Subjective: CC-  No complaints this morning. Denies abdominal pain, nausea, or vomiting. Does not have much of an appetite, but is tolerating what he is eating. Small BM yesterday with small amount of bright red blood. Hemoglobin stable yesterday, labs pending this morning. VSS.  Objective: Vital signs in last 24 hours: Temp:  [97.6 F (36.4 C)-98.3 F (36.8 C)] 97.7 F (36.5 C) (08/17 2043) Pulse Rate:  [80-83] 80 (08/17 2043) Resp:  [16-17] 16 (08/17 2043) BP: (131-160)/(66-68) 131/66 (08/17 2043) SpO2:  [98 %-100 %] 98 % (08/17 2043) Last BM Date: 03/30/18  Intake/Output from previous day: 08/17 0701 - 08/18 0700 In: 480 [P.O.:480] Out: 400 [Urine:400] Intake/Output this shift: No intake/output data recorded.  PE: Gen:  Alert, NAD, pleasant Pulm: effort normal Abd: Soft, ND, NT, midline and LLQ incisions cdi with stapes intact and no erythema or drainage Psych: A&Ox3  Skin: no rashes noted, warm and dry  Lab Results:  Recent Labs    03/29/18 0048 03/30/18 0723 03/30/18 1903  WBC 7.7 7.6  --   HGB 8.8* 10.6* 10.5*  HCT 27.2* 32.4* 31.8*  PLT 195 253  --    BMET Recent Labs    03/30/18 0723  NA 139  K 3.3*  CL 106  CO2 27  GLUCOSE 136*  BUN 12  CREATININE 1.02  CALCIUM 8.5*   PT/INR No results for input(s): LABPROT, INR in the last 72 hours. CMP     Component Value Date/Time   NA 139 03/30/2018 0723   K 3.3 (L) 03/30/2018 0723   CL 106 03/30/2018 0723   CO2 27 03/30/2018 0723   GLUCOSE 136 (H) 03/30/2018 0723   BUN 12 03/30/2018 0723   CREATININE 1.02 03/30/2018 0723   CREATININE 1.28 (H) 04/25/2016 1038   CALCIUM 8.5 (L) 03/30/2018 0723   PROT 4.6 (L) 03/28/2018 0007   ALBUMIN 2.5 (L) 03/28/2018 0007   AST 15 03/28/2018 0007   ALT 11 03/28/2018 0007   ALKPHOS 37 (L) 03/28/2018 0007   BILITOT 0.7 03/28/2018 0007   GFRNONAA >60 03/30/2018 0723   GFRAA >60 03/30/2018 0723   Lipase   No results found for: LIPASE     Studies/Results: Mr Brain Wo Contrast  Result Date: 03/29/2018 CLINICAL DATA:  Facial paralysis EXAM: MRI HEAD WITHOUT CONTRAST TECHNIQUE: Multiplanar, multiecho pulse sequences of the brain and surrounding structures were obtained without intravenous contrast. COMPARISON:  CT head 08/17/2017 FINDINGS: Brain: Moderate atrophy. Negative for acute infarct. Minimal chronic ischemic change in the white matter. Negative for hemorrhage or mass. Negative for midline shift. Vascular: Normal arterial flow voids Skull and upper cervical spine: Negative Sinuses/Orbits: Mild mucosal edema in the paranasal sinuses and mastoid sinus bilaterally. Normal orbit Other: None IMPRESSION: Moderate atrophy.  No acute intracranial abnormality. Electronically Signed   By: Franchot Gallo M.D.   On: 03/29/2018 16:13   [place] Ekg Site Rite  Result Date: 03/30/2018 If Site Rite image not attached, placement could not be confirmed due to current cardiac rhythm.   Anti-infectives: Anti-infectives (From admission, onward)   Start     Dose/Rate Route Frequency Ordered Stop   03/30/18 1000  valACYclovir (VALTREX) tablet 1,000 mg     1,000 mg Oral 3 times daily 03/30/18 0816 04/06/18 0959   03/26/18 2300  cefoTEtan (CEFOTAN) 2 g in sodium chloride 0.9 % 100 mL IVPB     2 g 200 mL/hr over 30 Minutes Intravenous Every  12 hours 03/26/18 1606 03/27/18 0029   03/26/18 0900  cefoTEtan (CEFOTAN) 2 g in sodium chloride 0.9 % 100 mL IVPB     2 g 200 mL/hr over 30 Minutes Intravenous To [facility] 03/25/18 1443 03/26/18 1125       Assessment/Plan Bell's palsy DM 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 Georgette Dover - POD 5 - Complication - post-operative bleeding probably from anastomotic staple line and dysfunctional platelets -  hemoglobin stable 8/17, labs pending today but VSS  ID - cefotan periop, valtrex 8/17>> FEN - CM diet, Glucerna VTE - SCDs Foley - none Follow up - Dr. Georgette Dover  Plan - Small amount of blood in BM last night, but patient has no abdominal pain, nausea, or vomiting. Vital signs are stable. Hemoglobin/hematocrit pending this morning.    LOS: 5 days    [name] , [facility] Surgery 03/31/2018, 8:21 AM Pager: [phone] Consults: [phone] Mon 7:00 am -11:30 AM Tues-Fri 7:00 am-4:30 pm Sat-Sun 7:00 am-11:30 am

## 2018-03-31 NOTE — Progress Notes (Signed)
PROGRESS NOTE    [name]  OIN:[id number] DOB: Dec 14, 1945 DOA: 03/26/2018 PCP: [name], MD    Brief Narrative: 72 year old male with PMH significant for but not limited to CAD/ CABG in 2017 on plavix and cilostazol , HTN, PVD, CKD, DM, neuropathy, GERD, HLD, and previous history of perforated sigmoid diverticulitis s/p Hartmann's procedure on 5/2 with benign surgical pathology. He has done well post-operatively.   Presented back for colostomy reversal..  A sigmoidoscopy was performed at the end of surgery to verify no bleeding at the staple line.  Patient states he has held his two antiplatelet meds for five days prior to surgery.  In PACU, patient began passing some clot and melena with istat Hgb 11.2; preop was 16.1.  Upon arrival to surgical floor, he had an episode where he became nauseated with probable vagal/ syncopal episode where he became unresponsive with some question of seizure activity due to shaking in his arms, however patient woke up and was not post-ictal.  He was hypotensive and therefore transferred to ICU.  EKG showing normal sinus rhythm with prolonged QTc at 0.489.  CCS at bedside to pack gel-foam into the rectum up to the anastomosis and 2 pack of platelets to be transfused ordered.  Patient received one PRBC 8-14. [facility] consulted by CCM to help with medical care.    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  Admitted 03-26-2018 for elective  colostomy reversal.  Management per surgery.   Post Operative Acute Blood loss anemia;  Hb this am at 7.8. Agree with another unit PRBC in setting of mild elevation troponin.  He had oly one small bloody BM yesterday.  No further bloody stool.  Continue with IV fluids.  Hb yesterday stable at 10. Had two small bloody bowel movement last night.  Await hb this am. Observed   Right side facial  paralysis ; Bell's palsy; He was notice to have right face weakness today by nurse. Family report he had right face weakness, drooling since before 8-12. MRI negative for stroke.  Discussed with neurology, we can use prednisone and acyclovir for 7 days. He recommended to discussed with surgeon regarding prednisone use due to recent sx. Discussed with DR Rosendo Gros, would like to hold on steroids due to recent sx.  Report improvement of lacrimation and itching of right eye.  Stable.   Hypokalemia; replete 40 meq times one..   Syncope;  ECHO; Normal Ef, no diastolic dysfunction.  Mild elevation of troponin. Suspect demand ischemia. Plan to transfuse one unit PRBC, keep hb above 8.  Cardiology consulted.  Suspect syncope was related to hypovolemia.  Resolved.   Hypotension; resolved, now hypertensive.  In setting of acute blood loss anemia.   HTN; continue with metoprolol.  Will add PRN hydralazine.  Continue with norvasc 10 mg daily  AKI; improved. NSL.   DM type 2;  CBG overnight and today 104 range. Hold on starting lantus. Poor oral intake.  Continue with SSI.  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 No anticoagulation in setting of post op bleeding.  Code Status: Full code.  Family Communication: Care discussed with patient.  Disposition Plan: Remain in the ICU.    Procedures: Colostomy reversal 8-13  Antimicrobials: none  Subjective: Report improvement of right eye lacrimation.  Abdominal pain better.  Report 2 Bowel movement , small amount of  blood.     Objective: Vitals:   03/30/18 0815 03/30/18 1229 03/30/18 1522 03/30/18 2043  BP:   (!) 160/68 131/66  Pulse:   83 80  Resp:   17 16  Temp: (!) 97.5 F (36.4 C) 97.6 F (36.4 C) 98.3 F (36.8 C) 97.7 F (36.5 C)  TempSrc: Oral Oral Oral Oral  SpO2:   100% 98%  Weight:      Height:        Intake/Output Summary (Last 24 hours) at 03/31/2018 0817 Last data filed at 03/30/2018 1407 Gross per 24 hour  Intake 480 ml  Output 400 ml  Net 80 ml   Filed Weights   03/26/18 0811 03/26/18 2000  Weight: 69 kg 70.9 kg    Examination:  General exam: NAD Respiratory system: CTA Cardiovascular system: S1, S 2 RRR Gastrointestinal system: BS present, soft, mild tender, dressing mid abdomen.  Central nervous system: Mild right side paralysis.  Extremities: symmetric motor strengn.  Skin: No rash    Data Reviewed: I have personally reviewed following labs and imaging studies  CBC: Recent Labs  Lab 03/27/18 0702 03/27/18 1255 03/28/18 0007 03/28/18 1233 03/29/18 0048 03/30/18 0723 03/30/18 1903  WBC 10.1 13.5* 9.5 9.6 7.7 7.6  --   NEUTROABS 7.8*  --  7.2 7.5  --   --   --   HGB 7.6* 9.0* 7.8* 9.2* 8.8* 10.6* 10.5*  HCT 23.4* 26.1* 23.4* 28.0* 27.2* 32.4* 31.8*  MCV 93.2 90.0 93.2 92.7 92.8 92.3  --   PLT 273 280 199 209 195 253  --    Basic Metabolic Panel: Recent Labs  Lab 03/26/18 1617 03/26/18 1901 03/27/18 0043 03/28/18 0007 03/29/18 0048 03/30/18 0723  NA 140 140 140 140  --  139  K 3.9 4.1 4.9 3.7  --  3.3*  CL  --  106 108 108  --  106  CO2  --  23 21* 26  --  27  GLUCOSE 197* 229* 232* 105*  --  136*  BUN  --  18 22 22   --  12  CREATININE  --  1.40* 1.39* 1.19  --  1.02  CALCIUM  --  7.6* 7.5* 7.6*  --  8.5*  MG  --  1.3* 2.0 1.9 2.1  --   PHOS  --   --  4.8* 2.7 2.8  --    GFR: Estimated Creatinine Clearance: 60.8 mL/min (by C-G formula based on SCr of 1.02 mg/dL). Liver Function Tests: Recent Labs  Lab 03/26/18 1901 03/27/18 0043  03/28/18 0007  AST 15  --  15  ALT 11  --  11  ALKPHOS 40  --  37*  BILITOT 0.7  --  0.7  PROT 4.1*  --  4.6*  ALBUMIN 2.5* 2.5* 2.5*   No results for input(s): LIPASE, AMYLASE in the last 168 hours. No results for input(s): AMMONIA in the last 168 hours. Coagulation Profile: Recent Labs  Lab 03/26/18 1901  INR 1.21   Cardiac Enzymes: Recent Labs  Lab 03/26/18 1901  03/28/18 0007 03/28/18 0805  TROPONINI <0.03 0.04* 0.04*   BNP (last 3 results) No results for input(s): PROBNP in the last 8760 hours. HbA1C: No results for input(s): HGBA1C in the last 72 hours. CBG: Recent Labs  Lab 03/30/18 0319 03/30/18 0813 03/30/18 1621 03/30/18 2023 03/31/18 0748  GLUCAP 136* 126* 184* 198* 163*   Lipid Profile: No results for input(s): CHOL, HDL, LDLCALC, TRIG, CHOLHDL, LDLDIRECT in the last 72 hours. Thyroid Function Tests: No results for input(s): TSH, T4TOTAL, FREET4, T3FREE, THYROIDAB in the last 72 hours. Anemia Panel: No results for input(s): VITAMINB12, FOLATE, FERRITIN, TIBC, IRON, RETICCTPCT in the last 72 hours. Sepsis Labs: Recent Labs  Lab 03/27/18 0043  PROCALCITON 0.51    Recent Results (from the past 240 hour(s))  Culture, blood (routine x 2)     Status: None (Preliminary result)   Collection Time: 03/26/18  8:15 PM  Result Value Ref Range Status   Specimen Description BLOOD RIGHT HAND  Final   Special Requests   Final    BOTTLES DRAWN AEROBIC ONLY Blood Culture results may not be optimal due to an inadequate volume of blood received in culture bottles   Culture   Final    NO GROWTH 4 DAYS Performed at [facility], [place] [address] [place], [place] [address]    Report Status PENDING  Incomplete  MRSA PCR Screening     Status: None   Collection Time: 03/26/18  9:56 PM  Result Value Ref Range Status   MRSA by PCR NEGATIVE NEGATIVE Final    Comment:        The GeneXpert MRSA Assay (FDA approved for NASAL specimens only), is one component of  a comprehensive MRSA colonization surveillance program. It is not intended to diagnose MRSA infection nor to guide or monitor treatment for MRSA infections. Performed at [facility], [place] [address], [place], [place] [address]   Culture, blood (routine x 2)     Status: None (Preliminary result)   Collection Time: 03/27/18 12:53 AM  Result Value Ref Range Status   Specimen Description BLOOD RIGHT HAND  Final   Special Requests   Final    BOTTLES DRAWN AEROBIC ONLY Blood Culture results may not be optimal due to an inadequate volume of blood received in culture bottles   Culture   Final    NO GROWTH 3 DAYS Performed at [facility], [place] [address], [place], [place] [address]    Report Status PENDING  Incomplete         Radiology Studies: Mr Brain Wo Contrast  Result Date: 03/29/2018 CLINICAL DATA:  Facial paralysis EXAM: MRI HEAD WITHOUT CONTRAST TECHNIQUE: Multiplanar, multiecho pulse sequences of the brain and surrounding structures were obtained without intravenous contrast. COMPARISON:  CT head 08/17/2017 FINDINGS: Brain: Moderate atrophy. Negative for acute infarct. Minimal chronic ischemic change in the white matter. Negative for hemorrhage or mass. Negative for midline shift. Vascular: Normal arterial flow voids Skull and upper cervical spine: Negative Sinuses/Orbits: Mild mucosal edema in the paranasal sinuses and mastoid sinus bilaterally. Normal orbit Other: None IMPRESSION: Moderate atrophy.  No acute intracranial abnormality. Electronically Signed   By: Franchot Gallo M.D.   On: 03/29/2018 16:13   [place] Ekg Site Rite  Result Date: 03/30/2018 If Site Rite image not attached, placement could not be confirmed due to current cardiac rhythm.       Scheduled Meds: . acetaminophen  1,000 mg Oral Q6H  . amLODipine  10 mg Oral Daily  . atorvastatin  40 mg Oral q1800  . ferrous sulfate  325 mg Oral BID WC  . gabapentin  300 mg Oral TID  . insulin aspart  0-15  Units Subcutaneous TID AC & HS  . metoprolol tartrate  25 mg Oral BID  . pantoprazole  40 mg Oral Daily  . polyethylene glycol  17 g Oral Daily  . potassium chloride  40 mEq Oral Once  . valACYclovir  1,000 mg Oral TID   Continuous Infusions:    LOS: 5 days    Time spent: 35 minutes.     Elmarie Shiley, MD [facility] Pager [phone]  If 7PM-7AM, please contact night-coverage [email or web address] Password [facility] 03/31/2018, 8:17 AM

## 2018-04-01 LAB — CULTURE, BLOOD (ROUTINE X 2): Culture: NO GROWTH

## 2018-04-01 LAB — GLUCOSE, CAPILLARY
GLUCOSE-CAPILLARY: 221 mg/dL — AB (ref 70–99)
GLUCOSE-CAPILLARY: 111 mg/dL — AB (ref 70–99)
Glucose-Capillary: 152 mg/dL — ABNORMAL HIGH (ref 70–99)
Glucose-Capillary: 274 mg/dL — ABNORMAL HIGH (ref 70–99)

## 2018-04-01 LAB — CBC
HEMATOCRIT: 37.7 % — AB (ref 39.0–52.0)
HEMOGLOBIN: 12.2 g/dL — AB (ref 13.0–17.0)
MCH: 30.4 pg (ref 26.0–34.0)
MCHC: 32.4 g/dL (ref 30.0–36.0)
MCV: 94 fL (ref 78.0–100.0)
Platelets: 371 10*3/uL (ref 150–400)
RBC: 4.01 MIL/uL — AB (ref 4.22–5.81)
RDW: 15 % (ref 11.5–15.5)
WBC: 7.7 10*3/uL (ref 4.0–10.5)

## 2018-04-01 MED ORDER — VALACYCLOVIR HCL 1 G PO TABS: 1000.0000 mg | ORAL_TABLET | Freq: Three times a day (TID) | ORAL | 0 refills | Status: AC

## 2018-04-01 MED ORDER — CLOPIDOGREL BISULFATE 75 MG PO TABS
75.0000 mg | ORAL_TABLET | Freq: Every day | ORAL | Status: DC
  Administered 2018-04-01: 75 mg via ORAL
  Filled 2018-04-01: qty 1

## 2018-04-01 MED ORDER — AMLODIPINE BESYLATE 10 MG PO TABS: 10.0000 mg | ORAL_TABLET | Freq: Every day | ORAL | 0 refills | Status: DC

## 2018-04-01 MED ORDER — INSULIN GLARGINE 100 UNIT/ML ~~LOC~~ SOLN: 8.0000 [IU] | Freq: Every day | SUBCUTANEOUS | 11 refills | Status: DC

## 2018-04-01 MED ORDER — CILOSTAZOL 100 MG PO TABS
100.0000 mg | ORAL_TABLET | Freq: Two times a day (BID) | ORAL | Status: DC
  Administered 2018-04-01: 100 mg via ORAL
  Filled 2018-04-01 (×3): qty 1

## 2018-04-01 NOTE — Progress Notes (Signed)
PROGRESS NOTE    [name]  SWF:[id number] DOB: [birth year] DOA: 03/26/2018 PCP: [name], MD    Brief Narrative: 72 year old male with PMH significant for but not limited to CAD/ CABG in 2017 on plavix and cilostazol , HTN, PVD, CKD, DM, neuropathy, GERD, HLD, and previous history of perforated sigmoid diverticulitis s/p Hartmann's procedure on 5/2 with benign surgical pathology. He has done well post-operatively.   Presented back for colostomy reversal..  A sigmoidoscopy was performed at the end of surgery to verify no bleeding at the staple line.  Patient states he has held his two antiplatelet meds for five days prior to surgery.  In PACU, patient began passing some clot and melena with istat Hgb 11.2; preop was 16.1.  Upon arrival to surgical floor, he had an episode where he became nauseated with probable vagal/ syncopal episode where he became unresponsive with some question of seizure activity due to shaking in his arms, however patient woke up and was not post-ictal.  He was hypotensive and therefore transferred to ICU.  EKG showing normal sinus rhythm with prolonged QTc at 0.489.  CCS at bedside to pack gel-foam into the rectum up to the anastomosis and 2 pack of platelets to be transfused ordered.  Patient received one PRBC 8-14. [facility] consulted by CCM to help with medical care.    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  Admitted 03-26-2018 for elective  colostomy reversal.  Management per surgery.   Post Operative Acute Blood loss anemia;  Hb this am at 7.8. Agree with another unit PRBC in setting of mild elevation troponin.  He had oly one small bloody BM yesterday.  No further bloody stool.  Continue with IV fluids.  Hb yesterday stable at 10. Had two small bloody bowel movement last night.  Hb has been stable. Await lab for the morning.    Right side facial  paralysis ; Bell's palsy; He was notice to have right face weakness today by nurse. Family report he had right face weakness, drooling since before 8-12. MRI negative for stroke.  Discussed with neurology, we can use prednisone and acyclovir for 7 days. He recommended to discussed with surgeon regarding prednisone use due to recent sx. Discussed with DR Rosendo Gros, would like to hold on steroids due to recent sx.  Report improvement of lacrimation and itching of right eye.  Stable. improved  Hypokalemia; replete 40 meq times one..   Syncope;  ECHO; Normal Ef, no diastolic dysfunction.  Mild elevation of troponin. Suspect demand ischemia. Plan to transfuse one unit PRBC, keep hb above 8.  Cardiology consulted.  Suspect syncope was related to hypovolemia.  Resolved.   Hypotension; resolved, now hypertensive.  In setting of acute blood loss anemia.   HTN; continue with metoprolol.  Will add PRN hydralazine.  Continue with norvasc 10 mg daily  AKI; improved. NSL.   DM type 2;  CBG overnight and today 104 range. Hold on starting lantus. Poor oral  intake.  Continue with SSI.  Discharge on lantus 8 units.   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  -Will defer resuming anticoagulation to sx.  -discharge on low dose lantus 8 untis.  -discharge on Norvasc and metoprolol.  -Med rec done.  valtrex for 5 more days for Bell' s palsy.   DVT prophylaxis: SCD. No anticoagulation in setting of post op bleeding.  Code Status: Full code.  Family  Communication: Care discussed with patient.  Disposition Plan: Remain in the ICU.    Procedures: Colostomy reversal 8-13  Antimicrobials: none  Subjective: He is feeling well, wants to go home.    Objective: Vitals:   03/30/18 2043 03/31/18 1517 03/31/18 2158 04/01/18 0409  BP: 131/66 (!) 150/62 137/75 126/83  Pulse: 80 67 73 66  Resp: 16 17 18 16   Temp: 97.7 F (36.5 C) 97.9 F (36.6 C) 98.3 F (36.8 C) 98.2 F (36.8 C)  TempSrc: Oral Oral Oral Oral  SpO2: 98% 99% 97%   Weight:      Height:        Intake/Output Summary (Last 24 hours) at 04/01/2018 0825 Last data filed at 04/01/2018 0600 Gross per 24 hour  Intake 800 ml  Output -  Net 800 ml   Filed Weights   03/26/18 0811 03/26/18 2000  Weight: 69 kg 70.9 kg    Examination:  General exam: NAD Respiratory system: CTA Cardiovascular system: S 1, S 2 RRR Gastrointestinal system: BS present soft, nt, dressing in place   Central nervous system: Mild right side paralysis.  Extremities: no edema Skin: no rash    Data Reviewed: I have personally reviewed following labs and imaging studies  CBC: Recent Labs  Lab 03/27/18 0702  03/28/18 0007 03/28/18 1233 03/29/18 0048 03/30/18 0723 03/30/18 1903 03/31/18 0759  WBC 10.1   < > 9.5 9.6 7.7 7.6  --  7.8  NEUTROABS 7.8*  --  7.2 7.5  --   --   --   --   HGB 7.6*   < > 7.8* 9.2* 8.8* 10.6* 10.5* 10.9*  HCT 23.4*   < > 23.4* 28.0* 27.2* 32.4* 31.8* 33.5*  MCV 93.2   < > 93.2 92.7 92.8 92.3  --  93.1  PLT 273   < > 199 209 195 253  --  306   < > = values in this interval not displayed.   Basic Metabolic Panel: Recent Labs  Lab 03/26/18 1901 03/27/18 0043 03/28/18 0007 03/29/18 0048 03/30/18 0723 03/31/18 0759  NA 140 140 140  --  139 138  K 4.1 4.9 3.7  --  3.3* 3.9  CL 106 108 108  --  106 104  CO2 23 21* 26  --  27 25  GLUCOSE 229* 232* 105*  --  136* 175*  BUN 18 22 22   --  12 13  CREATININE 1.40* 1.39* 1.19  --  1.02 1.04  CALCIUM 7.6* 7.5*  7.6*  --  8.5* 8.7*  MG 1.3* 2.0 1.9 2.1  --   --   PHOS  --  4.8* 2.7 2.8  --   --    GFR: Estimated Creatinine Clearance: 59.6 mL/min (by C-G formula based on SCr of 1.04 mg/dL). Liver Function Tests: Recent Labs  Lab 03/26/18 1901 03/27/18 0043 03/28/18 0007  AST 15  --  15  ALT 11  --  11  ALKPHOS 40  --  37*  BILITOT  0.7  --  0.7  PROT 4.1*  --  4.6*  ALBUMIN 2.5* 2.5* 2.5*   No results for input(s): LIPASE, AMYLASE in the last 168 hours. No results for input(s): AMMONIA in the last 168 hours. Coagulation Profile: Recent Labs  Lab 03/26/18 1901  INR 1.21   Cardiac Enzymes: Recent Labs  Lab 03/26/18 1901 03/28/18 0007 03/28/18 0805  TROPONINI <0.03 0.04* 0.04*   BNP (last 3 results) No results for input(s): PROBNP in the last 8760 hours. HbA1C: No results for input(s): HGBA1C in the last 72 hours. CBG: Recent Labs  Lab 03/31/18 1200 03/31/18 1720 03/31/18 2218 04/01/18 0137 04/01/18 0811  GLUCAP 175* 206* 140* 111* 152*   Lipid Profile: No results for input(s): CHOL, HDL, LDLCALC, TRIG, CHOLHDL, LDLDIRECT in the last 72 hours. Thyroid Function Tests: No results for input(s): TSH, T4TOTAL, FREET4, T3FREE, THYROIDAB in the last 72 hours. Anemia Panel: No results for input(s): VITAMINB12, FOLATE, FERRITIN, TIBC, IRON, RETICCTPCT in the last 72 hours. Sepsis Labs: Recent Labs  Lab 03/27/18 0043  PROCALCITON 0.51    Recent Results (from the past 240 hour(s))  Culture, blood (routine x 2)     Status: None   Collection Time: 03/26/18  8:15 PM  Result Value Ref Range Status   Specimen Description BLOOD RIGHT HAND  Final   Special Requests   Final    BOTTLES DRAWN AEROBIC ONLY Blood Culture results may not be optimal due to an inadequate volume of blood received in culture bottles   Culture   Final    NO GROWTH 5 DAYS Performed at [facility], [place] [address] [place], [place] [address]    Report Status 03/31/2018 FINAL  Final  MRSA PCR  Screening     Status: None   Collection Time: 03/26/18  9:56 PM  Result Value Ref Range Status   MRSA by PCR NEGATIVE NEGATIVE Final    Comment:        The GeneXpert MRSA Assay (FDA approved for NASAL specimens only), is one component of a comprehensive MRSA colonization surveillance program. It is not intended to diagnose MRSA infection nor to guide or monitor treatment for MRSA infections. Performed at [facility], [place] [address] [place], [place] [address]   Culture, blood (routine x 2)     Status: None (Preliminary result)   Collection Time: 03/27/18 12:53 AM  Result Value Ref Range Status   Specimen Description BLOOD RIGHT HAND  Final   Special Requests   Final    BOTTLES DRAWN AEROBIC ONLY Blood Culture results may not be optimal due to an inadequate volume of blood received in culture bottles   Culture   Final    NO GROWTH 4 DAYS Performed at [facility], [place] [address], [place], [place] [address]    Report Status PENDING  Incomplete         Radiology Studies: [place] Ekg Site Rite  Result Date: 03/30/2018 If Site Rite image not attached, placement could not be confirmed due to current cardiac rhythm.       Scheduled Meds: . acetaminophen  1,000 mg Oral Q6H  . amLODipine  10 mg Oral Daily  . atorvastatin  40 mg Oral q1800  . cilostazol  100 mg Oral BID  . clopidogrel  75 mg Oral Daily  . feeding supplement (GLUCERNA SHAKE)  237 mL Oral BID BM  . ferrous sulfate  325 mg Oral BID WC  . gabapentin  300  mg Oral TID  . insulin aspart  0-15 Units Subcutaneous TID AC & HS  . metoprolol tartrate  25 mg Oral BID  . pantoprazole  40 mg Oral Daily  . polyethylene glycol  17 g Oral Daily  . valACYclovir  1,000 mg Oral TID   Continuous Infusions:    LOS: 6 days    Time spent: 35 minutes.     Elmarie Shiley, MD [facility] Pager [phone]  If 7PM-7AM, please contact night-coverage [email or web address] Password [email or web address] 04/01/2018, 8:25 AM

## 2018-04-01 NOTE — Discharge Instructions (Signed)
ABDOMINAL SURGERY: POST OP INSTRUCTIONS  1. DIET: Follow a light bland diet the first 24 hours after arrival home, such as soup, liquids, crackers, etc.  Be sure to include lots of fluids daily.  Avoid fast food or heavy meals as your are more likely to get nauseated.  Eat a low fat the next few days after surgery.   2. Take your usually prescribed home medications unless otherwise directed. 3. PAIN CONTROL: a. Pain is best controlled by a usual combination of three different methods TOGETHER: i. Ice/Heat ii. Over the counter pain medication iii. Prescription pain medication b. Most patients will experience some swelling and bruising around the incisions.  Ice packs or heating pads (30-60 minutes up to 6 times a day) will help. Use ice for the first few days to help decrease swelling and bruising, then switch to heat to help relax tight/sore spots and speed recovery.  Some people prefer to use ice alone, heat alone, alternating between ice & heat.  Experiment to what works for you.  Swelling and bruising can take several weeks to resolve.   c. It is helpful to take an over-the-counter pain medication regularly for the first few weeks.  Choose one of the following that works best for you: i. Naproxen (Aleve, etc)  Two 228m tabs twice a day ii. Ibuprofen (Advil, etc) Three 2071mtabs four times a day (every meal & bedtime) iii. Acetaminophen (Tylenol, etc) 500-65061mour times a day (every meal & bedtime) d. A  prescription for pain medication (such as oxycodone, hydrocodone, etc) should be given to you upon discharge.  Take your pain medication as prescribed.  i. If you are having problems/concerns with the prescription medicine (does not control pain, nausea, vomiting, rash, itching, etc), please call us [phone] see if we need to switch you to a different pain medicine that will work better for you and/or control your side effect better. ii. If you need a refill on your pain medication,  please contact your pharmacy.  They will contact our office to request authorization. Prescriptions will not be filled after 5 pm or on week-ends. 4. Avoid getting constipated.  Between the surgery and the pain medications, it is common to experience some constipation.  Increasing fluid intake and taking a fiber supplement (such as Metamucil, Citrucel, FiberCon, MiraLax, etc) 1-2 times a day regularly will usually help prevent this problem from occurring.  A mild laxative (prune juice, Milk of Magnesia, MiraLax, etc) should be taken according to package directions if there are no bowel movements after 48 hours.   5. Watch out for diarrhea.  If you have many loose bowel movements, simplify your diet to bland foods & liquids for a few days.  Stop any stool softeners and decrease your fiber supplement.  Switching to mild anti-diarrheal medications (Kayopectate, Pepto Bismol) can help.  If this worsens or does not improve, please call us.[place]. Wash / shower every day.  You may shower over the incision / wound.  Avoid baths until the skin is fully healed.  Continue to shower over incision(s) after the dressing is off. 7. Remove your waterproof bandages 5 days after surgery.  You may leave the incision open to air.  Remove any wicks or ribbons in your wound.  If you have an open wound, please see wound care instructions. You may replace a dressing/Band-Aid to cover the incision for comfort if you wish. 8. ACTIVITIES as tolerated:   a. You may resume regular (light)  daily activities beginning the next day--such as daily self-care, walking, climbing stairs--gradually increasing activities as tolerated.  If you can walk 30 minutes without difficulty, it is safe to try more intense activity such as jogging, treadmill, bicycling, low-impact aerobics, swimming, etc. b. Save the most intensive and strenuous activity for last such as sit-ups, heavy lifting, contact sports, etc  Refrain from any heavy lifting or straining  until you are off narcotics for pain control.   c. DO NOT PUSH THROUGH PAIN.  Let pain be your guide: If it hurts to do something, don't do it.  Pain is your body warning you to avoid that activity for another week until the pain goes down. d. You may drive when you are no longer taking prescription pain medication, you can comfortably wear a seatbelt, and you can safely maneuver your car and apply brakes. e. Dennis Bast may have sexual intercourse when it is comfortable.  9. FOLLOW UP in our office a. Please call [facility] at [phone] to set up an appointment to see your surgeon in the office for a follow-up appointment approximately 1-2 weeks after your surgery. b. Make sure that you call for this appointment the day you arrive home to insure a convenient appointment time. 10. IF YOU HAVE DISABILITY OR FAMILY LEAVE FORMS, BRING THEM TO THE OFFICE FOR PROCESSING.  DO NOT GIVE THEM TO YOUR DOCTOR.   WHEN TO CALL us [phone]: 1. Poor pain control 2. Reactions / problems with new medications (rash/itching, nausea, etc)  3. Fever over 101.5 F (38.5 C) 4. Inability to urinate 5. Nausea and/or vomiting 6. Worsening swelling or bruising 7. Continued bleeding from incision. 8. Increased pain, redness, or drainage from the incision  The clinic staff is available to answer your questions during regular business hours (8:30am-5pm).  Please dont hesitate to call and ask to speak to one of our nurses for clinical concerns.   A surgeon from [facility] is always on call at the hospitals   If you have a medical emergency, go to the nearest emergency room or call 911.    [facility], [address], [place], [place], [place]  [address] ? MAIN: [phone] ? TOLL FREE: [phone] ? FAX [phone] [email or web address]

## 2018-04-01 NOTE — Care Management Important Message (Signed)
Important Message  Patient Details  Name: [name] MRN: [id number] Date of Birth: Jan 18, 1946   Medicare Important Message Given:  Yes    Roneisha Stern Montine Circle 04/01/2018, 3:56 PM

## 2018-04-01 NOTE — Progress Notes (Signed)
6 Days Post-Op   Subjective/Chief Complaint: Pt doing well overnight.  No BMs today   Objective: Vital signs in last 24 hours: Temp:  [97.9 F (36.6 C)-98.3 F (36.8 C)] 98.2 F (36.8 C) (08/19 0409) Pulse Rate:  [66-73] 66 (08/19 0409) Resp:  [16-18] 16 (08/19 0409) BP: (126-150)/(62-83) 126/83 (08/19 0409) SpO2:  [97 %-99 %] 97 % (08/18 2158) Last BM Date: 03/31/18  Intake/Output from previous day: 08/18 0701 - 08/19 0700 In: 800 [P.O.:800] Out: -  Intake/Output this shift: No intake/output data recorded.  General appearance: alert and cooperative GI: soft, non-tender; bowel sounds normal; no masses,  no organomegaly  Lab Results:  Recent Labs    03/30/18 0723 03/30/18 1903 03/31/18 0759  WBC 7.6  --  7.8  HGB 10.6* 10.5* 10.9*  HCT 32.4* 31.8* 33.5*  PLT 253  --  306   BMET Recent Labs    03/30/18 0723 03/31/18 0759  NA 139 138  K 3.3* 3.9  CL 106 104  CO2 27 25  GLUCOSE 136* 175*  BUN 12 13  CREATININE 1.02 1.04  CALCIUM 8.5* 8.7*   PT/INR No results for input(s): LABPROT, INR in the last 72 hours. ABG No results for input(s): PHART, HCO3 in the last 72 hours.  Invalid input(s): PCO2, PO2  Studies/Results: [place] Ekg Site Rite  Result Date: 03/30/2018 If Site Rite image not attached, placement could not be confirmed due to current cardiac rhythm.   Anti-infectives: Anti-infectives (From admission, onward)   Start     Dose/Rate Route Frequency Ordered Stop   04/01/18 0000  valACYclovir (VALTREX) 1000 MG tablet     1,000 mg Oral 3 times daily 04/01/18 0823 04/06/18 2359   03/30/18 1000  valACYclovir (VALTREX) tablet 1,000 mg     1,000 mg Oral 3 times daily 03/30/18 0816 04/06/18 0959   03/26/18 2300  cefoTEtan (CEFOTAN) 2 g in sodium chloride 0.9 % 100 mL IVPB     2 g 200 mL/hr over 30 Minutes Intravenous Every 12 hours 03/26/18 1606 03/27/18 0029   03/26/18 0900  cefoTEtan (CEFOTAN) 2 g in sodium chloride 0.9 % 100 mL IVPB     2 g 200 mL/hr  over 30 Minutes Intravenous To [facility] 03/25/18 1443 03/26/18 1125      Assessment/Plan: s/p Procedure(s): COLOSTOMY REVERSAL (N/A) -Hgb stable. Will restart home plaxi/pletal -will plan home later today if con't to do well  LOS: 6 days    Rosario Jacks., [facility] 04/01/2018

## 2018-04-01 NOTE — Progress Notes (Signed)
  Speech Language Pathology Treatment: Dysphagia  Patient Details Name: [name] MRN: [id number] DOB: Feb 18, 1946 Today's Date: 04/01/2018 Time: 1751-0258 SLP Time Calculation (min) (ACUTE ONLY): 18 min  Assessment / Plan / Recommendation Clinical Impression  Patient has been tolerating diet and medication administration according to nurse, wife and patient. Patient was observed with cracker and water (cup, straw). He had pocketing of cracker in right side. Liquid wash was not effective for clearance and he was unable to clear with tongue. Patient used finger to clear rt buccal space. He reports sensing food present in that space, but unable to clear it except with finger at this time. Recommend patient continue current diet as he tolerates well with good sensation of food pocketing and ability to clear himself. Education of aspiration precautions with patient and wife completed.     HPI HPI: 72 year old male with PMH significant for but not limited to CAD/ CABG in 2017 on plavix and cilostazol , HTN, PVD, CKD, DM, neuropathy, GERD, HLD, and previous history of perforated sigmoid diverticulitis s/p Hartmann's procedure on 5/2 with benign surgical pathology. He has done well post-operatively. Presented back for colostomy reversal with stapled EEA anastomosis 01/07/77, post op complications including bleeding probably from anastomotic staple line and dysfunctional platelets; significantly improved after platelet transfusion, and an episode where he became nauseated with probable vagal/ syncopal episode where he became unresponsive with some question of seizure activity due to shaking in his arms, however patient woke up and was not post-ictal.  He was hypotensive and therefore transferred to ICU.  Also with right sided facial droop. Family report he had right face weakness, drooling since before 8-12. MRI negative for stroke. Dx with Bells palsy, being treated with steroids. Patient with a recent barium  swallow complete (after being admitted for bilateral PNA) 10/26/2017, normal except for prominent CP.       SLP Plan  Continue with current plan of care       Recommendations  Diet recommendations: Regular;Thin liquid Liquids provided via: Cup;Straw Medication Administration: Whole meds with liquid Supervision: Patient able to self feed Compensations: Slow rate;Small sips/bites;Clear throat intermittently Postural Changes and/or Swallow Maneuvers: Seated upright 90 degrees                Oral Care Recommendations: Oral care BID SLP Visit Diagnosis: Dysphagia, oropharyngeal phase (R13.12) Plan: Continue with current plan of care       [place], MA, CCC-SLP 04/01/2018 11:52 AM

## 2018-04-01 NOTE — Progress Notes (Signed)
Physical Therapy Treatment Patient Details Name: [name] MRN: [id number] DOB: 1946/06/08 Today's Date: 04/01/2018    History of Present Illness Pt is a 72 y/o male s/p colostomy reversal on 8/13. Pt with acute anemia following surgery as well as new onset R facial droop. MRI was negative for any acute abnormalities. PMH including but not limited to CAD/ CABG in 2017 on plavix and cilostazol , HTN, PVD, CKD, DM, neuropathy, GERD, HLD.    PT Comments    Pt supine on arrival with polite demeanor.  Pt educated on need for assessment of mobility for follow up recommendations as he is to d/c home today.  PTA assess bed mobility, transfers and gait and he is performing all skills with functional independence.  Pt reports he "sways" with his gait as home and this deviation did not hinder his function.  PTA instructed patient in stair negotiation and he was able to perfom independently.  Pt remains deconditioned so stand therapeutic exercises performed.  During standing exercises he presents with DOE, SPo2 on RA 91%.  Informed patient on the importance of a walking program and standing exercises at d/c home. Based on patient's performance and improvement with mobility he no longer requires OP follow up.  Will inform supervising PT of patient need for change in recommendations.      Follow Up Recommendations  No PT follow up     Equipment Recommendations  None recommended by PT    Recommendations for Other Services       Precautions / Restrictions Precautions Precautions: Fall Restrictions Weight Bearing Restrictions: No    Mobility  Bed Mobility Overal bed mobility: Independent Bed Mobility: Supine to Sit     Supine to sit: Independent        Transfers Overall transfer level: Independent Equipment used: None Transfers: Sit to/from Stand Sit to Stand: Independent            Ambulation/Gait Ambulation/Gait assistance: Independent Gait Distance (Feet): 650  Feet Assistive device: None Gait Pattern/deviations: Step-to pattern;WFL(Within Functional Limits)(minor swaying but no true LOB patient reports this is baseline.  )         Stairs Stairs: Yes Stairs assistance: Independent Stair Management: One rail Right;Alternating pattern;Forwards Number of Stairs: 8 General stair comments: Pt with slow guarded approach but no assistance or cueing needed. Therapist stood at the bottom of the stairs.     Wheelchair Mobility    Modified Rankin (Stroke Patients Only)       Balance                                            Cognition Arousal/Alertness: Awake/alert Behavior During Therapy: WFL for tasks assessed/performed Overall Cognitive Status: Within Functional Limits for tasks assessed                                        Exercises General Exercises - Lower Extremity Hip ABduction/ADduction: AROM;Both;10 reps;Standing Hip Flexion/Marching: AROM;Both;10 reps;Standing Heel Raises: AROM;Both;10 reps;Standing Mini-Sqauts: AROM;Both;10 reps;Standing    General Comments        Pertinent Vitals/Pain Pain Assessment: 0-10 Pain Score: 5  Pain Location: abdomen Pain Descriptors / Indicators: Grimacing;Guarding Pain Intervention(s): Monitored during session;Repositioned    Home Living  Prior Function            PT Goals (current goals can now be found in the care plan section) Acute Rehab PT Goals Patient Stated Goal: get back to normal Potential to Achieve Goals: Good Progress towards PT goals: Progressing toward goals    Frequency    Min 3X/week      PT Plan Discharge plan needs to be updated    Co-evaluation              AM-PAC PT "6 Clicks" Daily Activity  Outcome Measure  Difficulty turning over in bed (including adjusting bedclothes, sheets and blankets)?: None Difficulty moving from lying on back to sitting on the side of the bed? :  None Difficulty sitting down on and standing up from a chair with arms (e.g., wheelchair, bedside commode, etc,.)?: None Help needed moving to and from a bed to chair (including a wheelchair)?: None Help needed walking in hospital room?: None Help needed climbing 3-5 steps with a railing? : None 6 Click Score: 24    End of Session Equipment Utilized During Treatment: Gait belt Activity Tolerance: Patient tolerated treatment well Patient left: with call bell/phone within reach;with family/visitor present;Other (comment);in bed(Pt sitting edge of bed.  ) Nurse Communication: Mobility status PT Visit Diagnosis: Other abnormalities of gait and mobility (R26.89)     Time: 0626-9485 PT Time Calculation (min) (ACUTE ONLY): 17 min  Charges:  $Gait Training: 8-22 mins                     Governor Rooks, PTA pager [place] 04/01/2018, 2:11 PM

## 2018-04-01 NOTE — Discharge Summary (Signed)
Physician Discharge Summary  Patient ID: [name] MRN: [id number] DOB/AGE: 1946/08/06 72 y.o.  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  Postoperatively patient did have some bleeding per rectum.  Patient did have repeat CBC while in the PACU.this did reveal are fairly stable hematocrit.  Patient was thus transferred to the floor.  Upon arriving on the floor patient did have continued bleeding per rectum, there was a large amount.  This required rapid response the nursing call from the floor nurses.  Dr. Georgette Dover was calledas the patient was thought to have seizure activity versus possibly a vagal response. Patient was thus sent to the ICU at this time with continued CBC checks.  Patient was transfused platelets while in the ICU secondary to a history of Plavix preoperatively.patient is continue having some decreased bloody bowel movements.  Patient did stabilize.  CCM was counseled while in the ICU to help with critical care management. Patient did require 3 blood transfusions while in the ICU.  Patient was since started on a liquid diet.  He was able to tolerate that well and was in transition to a regular diet which he was able to tolerate well.  Patient minimal pain throughout his hospitalization.  Patient continue having bowel movements however the blood  within the bowel movements continue to decrease as expected.patient was transferred to the floor.  He was ambulatory well on his own.  He was otherwise afebrile.  Patient had a stable hematocrit while on the floor, having bowel function.  Patient was deemed stable for discharge and discharged home.  Consults: pulmonary/intensive care  Significant Diagnostic Studies: labs:  CBC Latest Ref Rng & Units 04/01/2018 03/31/2018 03/30/2018  WBC 4.0 - 10.5 K/uL 7.7 7.8 -  Hemoglobin 13.0 - 17.0 g/dL 12.2(L) 10.9(L) 10.5(L)  Hematocrit 39.0 - 52.0 % 37.7(L) 33.5(L) 31.8(L)  Platelets 150 - 400 K/uL 371 306 -     Treatments: procedures: surgery as above, blood transfusions  Discharge Exam: Blood pressure 127/64, pulse 87, temperature 98.2 F (36.8 C), temperature source Oral, resp. rate 16, height 5\' 6"  (1.676 m), weight 70.9 kg, SpO2 97 %. General appearance: alert and cooperative Cardio: regular rate and rhythm, S1, S2 normal, no murmur, click, rub or gallop  Abdomen: Abdomen soft, nontender nondistended, midline incision clean dry and intact, minimal surrounding erythema.  Disposition: Discharge disposition: 01-Home or Self Care       Discharge Instructions    Diet - low sodium heart healthy   Complete by:  As directed    Increase activity slowly   Complete by:  As directed      Allergies as of 04/01/2018   No Known Allergies     Medication List    STOP taking these medications   guaiFENesin 600 MG 12 hr tablet Commonly known as:  [place] these medications   albuterol  108 (90 Base) MCG/ACT inhaler Commonly known as:  PROVENTIL HFA;VENTOLIN HFA Inhale 2 puffs into the lungs every 6 (six) hours as needed for wheezing or shortness of breath.   amLODipine 10 MG tablet Commonly known as:  NORVASC Take 1 tablet (10 mg total) by mouth daily.   aspirin 81 MG tablet Take 81 mg by mouth daily.   cilostazol 100 MG tablet Commonly known as:  PLETAL Take 100 mg by mouth 2  (two) times daily.   clopidogrel 75 MG tablet Commonly known as:  PLAVIX Take 75 mg by mouth daily.   ferrous sulfate 325 (65 FE) MG tablet Take 325 mg by mouth 2 (two) times daily with a meal.   gabapentin 300 MG capsule Commonly known as:  NEURONTIN Take 300 mg by mouth 3 (three) times daily.   insulin glargine 100 UNIT/ML injection Commonly known as:  LANTUS Inject 0.08 mLs (8 Units total) into the skin at bedtime. What changed:  how much to take   metFORMIN 500 MG tablet Commonly known as:  GLUCOPHAGE Take 500 mg by mouth 2 (two) times daily with a meal.   metoprolol tartrate 25 MG tablet Commonly known as:  LOPRESSOR Take 25 mg by mouth 2 (two) times daily. What changed:  Another medication with the same name was removed. Continue taking this medication, and follow the directions you see here.   pantoprazole 40 MG tablet Commonly known as:  PROTONIX Take 1 tablet (40 mg total) by mouth daily.   simvastatin 80 MG tablet Commonly known as:  ZOCOR Take 40 mg by mouth at bedtime.   valACYclovir 1000 MG tablet Commonly known as:  VALTREX Take 1 tablet (1,000 mg total) by mouth 3 (three) times daily for 5 days.   vitamin B-12 1000 MCG tablet Commonly known as:  CYANOCOBALAMIN Take 1,000 mcg by mouth daily.   Vitamin D 2000 units tablet Take 2,000 Units by mouth daily.        Signed: Rosario Jacks., [place]leigh Paull 04/01/2018, 12:52 PM

## 2018-04-30 ENCOUNTER — Ambulatory Visit: Payer: Medicare Other | Admitting: Internal Medicine

## 2018-06-04 ENCOUNTER — Telehealth: Payer: Self-pay | Admitting: Internal Medicine

## 2018-06-04 NOTE — Telephone Encounter (Signed)
Spoke with pt. He is having a bowel movement twice daily that is sometimes watery. Pt states the color is possibly black and looks like coffee grains. Pt doesn't have any pain or nor does he feel sick. His appointment is Friday and he wasn't sure if he needed to complete a stool test prior to coming in to his appointment.

## 2018-06-04 NOTE — Telephone Encounter (Signed)
Pt's wife called to let us know that patient has OV to see RMR on Friday. She was wanting to know if she could stop by and get a stool sample kit because patient is having black coffee ground looking stools. They are wanting to do that and bring the stool back on Friday. Please advise and call [phone]

## 2018-06-05 ENCOUNTER — Other Ambulatory Visit: Payer: Self-pay

## 2018-06-05 DIAGNOSIS — R197 Diarrhea, unspecified: Secondary | ICD-10-CM

## 2018-06-05 LAB — CBC WITH DIFFERENTIAL/PLATELET
BASOS ABS: 57 {cells}/uL (ref 0–200)
Basophils Relative: 0.8 %
EOS ABS: 78 {cells}/uL (ref 15–500)
Eosinophils Relative: 1.1 %
HEMATOCRIT: 42.2 % (ref 38.5–50.0)
Hemoglobin: 14.5 g/dL (ref 13.2–17.1)
LYMPHS ABS: 1392 {cells}/uL (ref 850–3900)
MCH: 31 pg (ref 27.0–33.0)
MCHC: 34.4 g/dL (ref 32.0–36.0)
MCV: 90.2 fL (ref 80.0–100.0)
MPV: 8.6 fL (ref 7.5–12.5)
Monocytes Relative: 13.8 %
NEUTROS PCT: 64.7 %
Neutro Abs: 4594 cells/uL (ref 1500–7800)
Platelets: 355 10*3/uL (ref 140–400)
RBC: 4.68 10*6/uL (ref 4.20–5.80)
RDW: 13.3 % (ref 11.0–15.0)
Total Lymphocyte: 19.6 %
WBC mixed population: 980 cells/uL — ABNORMAL HIGH (ref 200–950)
WBC: 7.1 10*3/uL (ref 3.8–10.8)

## 2018-06-05 NOTE — Telephone Encounter (Signed)
Spoke with pt. Lab orders placed. Pt will have labs done.

## 2018-06-05 NOTE — Telephone Encounter (Signed)
Not an unreasonable request.  I would recommend a CBC and a stool sample for C. difficile ASAP

## 2018-06-05 NOTE — Telephone Encounter (Signed)
Lmom, waiting on a return call.  

## 2018-06-06 DIAGNOSIS — R197 Diarrhea, unspecified: Secondary | ICD-10-CM | POA: Diagnosis not present

## 2018-06-07 ENCOUNTER — Encounter: Payer: Self-pay | Admitting: Internal Medicine

## 2018-06-07 ENCOUNTER — Ambulatory Visit: Payer: Medicare Other | Admitting: Internal Medicine

## 2018-06-07 VITALS — BP 155/92 | HR 83 | Temp 96.6°F | Ht 67.0 in | Wt 146.4 lb

## 2018-06-07 DIAGNOSIS — R194 Change in bowel habit: Secondary | ICD-10-CM | POA: Diagnosis not present

## 2018-06-07 DIAGNOSIS — Z86010 Personal history of colonic polyps: Secondary | ICD-10-CM | POA: Diagnosis not present

## 2018-06-07 DIAGNOSIS — Z1211 Encounter for screening for malignant neoplasm of colon: Secondary | ICD-10-CM

## 2018-06-07 LAB — C. DIFFICILE GDH AND TOXIN A/B
GDH ANTIGEN: NOT DETECTED
MICRO NUMBER: 91283142
SPECIMEN QUALITY: ADEQUATE
TOXIN A AND B: NOT DETECTED

## 2018-06-07 NOTE — Patient Instructions (Signed)
Notify your diabetic doctor about the higher dose of metformin causing loose bowels  If it is recommended you stay on the higher dose of metformin, I recommend taking 1 Imodium tablet twice a day  Recommend 1 more colonoscopy in 5 years  Office visit with [place] here in 1 year  I will let you know about the stool sample once the report is available for review.

## 2018-06-07 NOTE — Progress Notes (Signed)
Primary Care Physician:  [name], MD Primary Gastroenterologist:  Dr. Gala Romney  Pre-Procedure History & Physical: HPI:  [name] is a 72 y.o. male here for visit for recent dark stools and diarrhea.  Patient taking iron  -  stools have been dark.  He stopped iron on his own - they lightened up.  Hemoglobin 2 days ago 14.5;  no hematochezia.  Status post recent takedown of colostomy  -  performed previously for complicated diverticulitis.  He is diabetic and his metformin was doubled recently which was associated with loose stools (describes [place] 6 7 stools 2-3 times daily).  He stopped the metformin and his loose stools have improved.  He has not contacted his endocrinologist as of yet.  C. difficile stool sample pending in the lab.  Past Medical History:  Diagnosis Date  . CAD S/P percutaneous coronary angioplasty 1998   PCI TO CX  . CKD (chronic kidney disease) stage 3, GFR 30-59 ml/min (HCC)   . Diabetes mellitus without complication ([place])   . Diverticulitis 08/16/2017   hospitalized with diverticulitis/sepsis  . GERD (gastroesophageal reflux disease)    occassionally  . 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 Hypercholesteremia   . Hypertension   . Neuropathy   . Peripheral vascular disease ([place])    s/p R-L FEM-FEM BYPASS  . Pneumonia   . Tachycardia    after CABG, pt on medicine for this    Past Surgical History:  Procedure Laterality Date  . ABDOMINAL AORTOGRAM Bilateral 09/19/2016   Procedure: iliac;  Surgeon: Serafina Mitchell, MD;  Location: [facility];  Service: Cardiovascular;  Laterality: Bilateral;  . CARDIAC CATHETERIZATION  2003   with stent  . CARDIAC CATHETERIZATION N/A 10/11/2015   Procedure: Left Heart Cath and Coronary Angiography;  Surgeon: Peter M Martinique, MD;  Location: [facility];  Service: Cardiovascular;  Laterality: N/A;  . COLON RESECTION N/A 12/13/2017   Procedure: EXPLORATORY LAPAROTOMY, SIGMOID COLECTOMY WITH COLOSTOMY;  Surgeon:  Donnie Mesa, MD;  Location: [facility];  Service: General;  Laterality: N/A;  . COLONOSCOPY N/A 09/22/2013   Procedure: COLONOSCOPY;  Surgeon: Daneil Dolin, MD;  [facility];  Service: Endoscopy;  Laterality: N/A;  9:30 AM  . COLONOSCOPY N/A 02/19/2018   Procedure: COLONOSCOPY;  Surgeon: Daneil Dolin, MD;  [facility];  Service: Endoscopy;  Laterality: N/A;  2:00pm  . COLOSTOMY N/A 12/13/2017   Procedure: COLOSTOMY;  Surgeon: Donnie Mesa, MD;  Location: [facility];  Service: General;  Laterality: N/A;  . COLOSTOMY REVERSAL N/A 03/26/2018   Procedure: COLOSTOMY REVERSAL;  Surgeon: Donnie Mesa, MD;  Location: [place];  Service: General;  Laterality: N/A;  . CORONARY ARTERY BYPASS GRAFT N/A 10/18/2015   Procedure: CORONARY ARTERY BYPASS GRAFTING (CABG) x  five, using left internal mammary artery and right leg greater saphenous vein harvested endoscopically;  Surgeon: [name], MD;  Location: [facility];  Service: Open Heart Surgery;  Laterality: N/A;  . ENDARTERECTOMY FEMORAL Right 08/09/2016   Procedure: ENDARTERECTOMY FEMORAL WITH VEIN PATCH ANGIOPLASTY;  Surgeon: Serafina Mitchell, MD;  Location: [facility];  Service: Vascular;  Laterality: Right;  . ESOPHAGOGASTRODUODENOSCOPY N/A 10/24/2017   Dr. Gala Romney: hiatal hernia  . FEMORAL-FEMORAL BYPASS GRAFT Bilateral 08/09/2016   Procedure: REVISION BYPASS GRAFT RIGHT FEMORAL-LEFT FEMORAL ARTERY;  Surgeon: Serafina Mitchell, MD;  Location: [facility];  Service: Vascular;  Laterality: Bilateral;  . FEMORAL-POPLITEAL BYPASS GRAFT    .  PERIPHERAL VASCULAR CATHETERIZATION N/A 05/08/2016   Procedure: Lower Extremity Angiography;  Surgeon: Lorretta Harp, MD;  Location: [facility];  Service: Cardiovascular;  Laterality: N/A;  . PERIPHERAL VASCULAR INTERVENTION Right 09/19/2016   Procedure: Peripheral Vascular Intervention;  Surgeon: Serafina Mitchell, MD;  Location: [facility];  Service: Cardiovascular;  Laterality: Right;  ext iliac  stent  . POLYPECTOMY  02/19/2018   Procedure: POLYPECTOMY;  Surgeon: Daneil Dolin, MD;  [facility];  Service: Endoscopy;;  . PROCTOSCOPY  10/24/2017   Procedure: PROCTOSCOPY;  Surgeon: Daneil Dolin, MD;  [facility];  Service: Endoscopy;;  . TEE WITHOUT CARDIOVERSION N/A 10/18/2015   Procedure: TRANSESOPHAGEAL ECHOCARDIOGRAM (TEE);  Surgeon: [name], MD;  Location: [facility];  Service: Open Heart Surgery;  Laterality: N/A;    Prior to Admission medications   Medication Sig Start Date End Date Taking? Authorizing Provider  albuterol (PROVENTIL HFA;VENTOLIN HFA) 108 (90 Base) MCG/ACT inhaler Inhale 2 puffs into the lungs every 6 (six) hours as needed for wheezing or shortness of breath. 10/03/17  Yes Kathie Dike, MD  amLODipine (NORVASC) 10 MG tablet Take 1 tablet (10 mg total) by mouth daily. 04/01/18  Yes Regalado, Belkys A, MD  aspirin 81 MG tablet Take 81 mg by mouth daily.   Yes [provider]  Cholecalciferol (VITAMIN D) 2000 UNITS tablet Take 2,000 Units by mouth daily.   Yes [provider]  cilostazol (PLETAL) 100 MG tablet Take 100 mg by mouth 2 (two) times daily.   Yes [provider]  clopidogrel (PLAVIX) 75 MG tablet Take 75 mg by mouth daily.   Yes [provider]  gabapentin (NEURONTIN) 300 MG capsule Take 300 mg by mouth 3 (three) times daily.    Yes [provider]  insulin glargine (LANTUS) 100 UNIT/ML injection Inject 0.08 mLs (8 Units total) into the skin at bedtime. Patient taking differently: Inject 25 Units into the skin at bedtime.  04/01/18  Yes Regalado, Belkys A, MD  metFORMIN (GLUCOPHAGE) 500 MG tablet Take 1,000 mg by mouth 2 (two) times daily with a meal.    Yes [provider]  metoprolol tartrate (LOPRESSOR) 25 MG tablet Take 25 mg by mouth 2 (two) times daily.   Yes [provider]  pantoprazole (PROTONIX) 40 MG tablet Take 1 tablet (40 mg total) by mouth daily.  08/19/17  Yes Dhungel, Nishant, MD  simvastatin (ZOCOR) 80 MG tablet Take 40 mg by mouth at bedtime.    Yes [provider]  vitamin B-12 (CYANOCOBALAMIN) 1000 MCG tablet Take 1,000 mcg by mouth daily.   Yes [provider]  ferrous sulfate 325 (65 FE) MG tablet Take 325 mg by mouth 2 (two) times daily with a meal.     [provider]    Allergies as of 06/07/2018  . (No Known Allergies)    Family History  Problem Relation Age of Onset  . Heart attack Mother   . Stroke Mother   . Colon cancer Brother        late 51s    Social History   Socioeconomic History  . Marital status: Married    Spouse name: Not on file  . Number of children: Not on file  . Years of education: Not on file  . Highest education level: Not on file  Occupational History  . Not on file  Social Needs  . Financial resource strain: Not on file  .  Food insecurity:    Worry: Not on file    Inability: Not on file  . Transportation needs:    Medical: Not on file    Non-medical: Not on file  Tobacco Use  . Smoking status: Former Smoker    Packs/day: 2.00    Years: 30.00    Pack years: 60.00    Types: Cigarettes    Last attempt to quit: 08/14/2000    Years since quitting: 17.8  . Smokeless tobacco: Never Used  Substance and Sexual Activity  . Alcohol use: No  . Drug use: No  . Sexual activity: Not on file  Lifestyle  . Physical activity:    Days per week: Not on file    Minutes per session: Not on file  . Stress: Not on file  Relationships  . Social connections:    Talks on phone: Not on file    Gets together: Not on file    Attends religious service: Not on file    Active member of club or organization: Not on file    Attends meetings of clubs or organizations: Not on file    Relationship status: Not on file  . Intimate partner violence:    Fear of current or ex partner: Not on file    Emotionally abused: Not on file    Physically abused: Not on file    Forced sexual  activity: Not on file  Other Topics Concern  . Not on file  Social History Narrative  . Not on file    Review of Systems: See HPI, otherwise negative ROS  Physical Exam: BP (!) 155/92   Pulse 83   Temp (!) 96.6 F (35.9 C) (Oral)   Ht 5\' 7"  (1.702 m)   Wt 146 lb 6.4 oz (66.4 kg)   BMI 22.93 kg/m  General:   Alert,   pleasant and cooperative in NAD Neck:  Supple; no masses or thyromegaly. No significant cervical adenopathy. Lungs:  Clear throughout to auscultation.   No wheezes, crackles, or rhonchi. No acute distress. Heart:  Regular rate and rhythm; no murmurs, clicks, rubs,  or gallops. Abdomen: Non-distended, normal bowel sounds.  Soft and nontender without appreciable mass or hepatosplenomegaly.  Pulses:  Normal pulses noted. Extremities:  Without clubbing or edema.  Impression/Plan: Pleasant 72 year old gentleman recently status post left hemicolectomy for complicated diverticulitis with temporary colostomy and subsequent takedown.  Subsequent colonoscopy demonstrated a small adenoma which was removed.  Recent dark stools likely secondary to iron.  Recent loose stools likely secondary to metformin.  Recent CBC most reassuring. C. difficile toxin assay pending to complete the evaluation-given his recent surgery/hospitalization/exposure to antibiotics.   Recommendations:  Notify your diabetic doctor about the higher dose of metformin causing loose bowels  If it is recommended you stay on the higher dose of metformin, I recommend taking 1 Imodium tablet twice a day  Recommend 1 more colonoscopy in 5 years  Office visit with [place] here in 1 year  I will let you know about the stool sample once the report is available for review.     Notice: This dictation was prepared with Dragon dictation along with smaller phrase technology. Any transcriptional errors that result from this process are unintentional and may not be corrected upon review.

## 2018-09-13 ENCOUNTER — Encounter (HOSPITAL_COMMUNITY): Payer: Self-pay | Admitting: Emergency Medicine

## 2018-09-13 ENCOUNTER — Emergency Department (HOSPITAL_COMMUNITY): Payer: Medicare Other

## 2018-09-13 ENCOUNTER — Inpatient Hospital Stay (HOSPITAL_COMMUNITY)
Admission: EM | Admit: 2018-09-13 | Discharge: 2018-09-16 | DRG: 872 | Disposition: A | Discharge status: Home / Self Care | Payer: Medicare Other | Attending: Internal Medicine | Admitting: Internal Medicine

## 2018-09-13 DIAGNOSIS — E119 Type 2 diabetes mellitus without complications: Secondary | ICD-10-CM

## 2018-09-13 DIAGNOSIS — N183 Chronic kidney disease, stage 3 unspecified: Secondary | ICD-10-CM | POA: Diagnosis present

## 2018-09-13 DIAGNOSIS — E861 Hypovolemia: Secondary | ICD-10-CM | POA: Diagnosis not present

## 2018-09-13 DIAGNOSIS — E7800 Pure hypercholesterolemia, unspecified: Secondary | ICD-10-CM | POA: Diagnosis not present

## 2018-09-13 DIAGNOSIS — N179 Acute kidney failure, unspecified: Secondary | ICD-10-CM | POA: Diagnosis not present

## 2018-09-13 DIAGNOSIS — E1165 Type 2 diabetes mellitus with hyperglycemia: Secondary | ICD-10-CM | POA: Diagnosis present

## 2018-09-13 DIAGNOSIS — N3090 Cystitis, unspecified without hematuria: Secondary | ICD-10-CM | POA: Diagnosis not present

## 2018-09-13 DIAGNOSIS — E876 Hypokalemia: Secondary | ICD-10-CM | POA: Diagnosis not present

## 2018-09-13 DIAGNOSIS — E785 Hyperlipidemia, unspecified: Secondary | ICD-10-CM | POA: Diagnosis not present

## 2018-09-13 DIAGNOSIS — Z87891 Personal history of nicotine dependence: Secondary | ICD-10-CM

## 2018-09-13 DIAGNOSIS — B961 Klebsiella pneumoniae [K. pneumoniae] as the cause of diseases classified elsewhere: Secondary | ICD-10-CM | POA: Diagnosis not present

## 2018-09-13 DIAGNOSIS — R5383 Other fatigue: Secondary | ICD-10-CM

## 2018-09-13 DIAGNOSIS — I471 Supraventricular tachycardia: Secondary | ICD-10-CM | POA: Diagnosis not present

## 2018-09-13 DIAGNOSIS — Z9114 Patient's other noncompliance with medication regimen: Secondary | ICD-10-CM | POA: Diagnosis not present

## 2018-09-13 DIAGNOSIS — Z794 Long term (current) use of insulin: Secondary | ICD-10-CM

## 2018-09-13 DIAGNOSIS — E1122 Type 2 diabetes mellitus with diabetic chronic kidney disease: Secondary | ICD-10-CM | POA: Diagnosis present

## 2018-09-13 DIAGNOSIS — R079 Chest pain, unspecified: Secondary | ICD-10-CM | POA: Diagnosis not present

## 2018-09-13 DIAGNOSIS — R410 Disorientation, unspecified: Secondary | ICD-10-CM | POA: Diagnosis not present

## 2018-09-13 DIAGNOSIS — A419 Sepsis, unspecified organism: Principal | ICD-10-CM | POA: Diagnosis present

## 2018-09-13 DIAGNOSIS — I129 Hypertensive chronic kidney disease with stage 1 through stage 4 chronic kidney disease, or unspecified chronic kidney disease: Secondary | ICD-10-CM | POA: Diagnosis not present

## 2018-09-13 DIAGNOSIS — E1151 Type 2 diabetes mellitus with diabetic peripheral angiopathy without gangrene: Secondary | ICD-10-CM | POA: Diagnosis not present

## 2018-09-13 DIAGNOSIS — Z8249 Family history of ischemic heart disease and other diseases of the circulatory system: Secondary | ICD-10-CM

## 2018-09-13 DIAGNOSIS — E1140 Type 2 diabetes mellitus with diabetic neuropathy, unspecified: Secondary | ICD-10-CM | POA: Diagnosis present

## 2018-09-13 DIAGNOSIS — Z7982 Long term (current) use of aspirin: Secondary | ICD-10-CM

## 2018-09-13 DIAGNOSIS — Z951 Presence of aortocoronary bypass graft: Secondary | ICD-10-CM | POA: Diagnosis not present

## 2018-09-13 DIAGNOSIS — K219 Gastro-esophageal reflux disease without esophagitis: Secondary | ICD-10-CM | POA: Diagnosis not present

## 2018-09-13 DIAGNOSIS — E871 Hypo-osmolality and hyponatremia: Secondary | ICD-10-CM | POA: Diagnosis present

## 2018-09-13 DIAGNOSIS — A414 Sepsis due to anaerobes: Secondary | ICD-10-CM

## 2018-09-13 DIAGNOSIS — I10 Essential (primary) hypertension: Secondary | ICD-10-CM | POA: Diagnosis not present

## 2018-09-13 DIAGNOSIS — A4159 Other Gram-negative sepsis: Secondary | ICD-10-CM

## 2018-09-13 DIAGNOSIS — R197 Diarrhea, unspecified: Secondary | ICD-10-CM | POA: Diagnosis present

## 2018-09-13 DIAGNOSIS — N181 Chronic kidney disease, stage 1: Secondary | ICD-10-CM

## 2018-09-13 DIAGNOSIS — I2510 Atherosclerotic heart disease of native coronary artery without angina pectoris: Secondary | ICD-10-CM | POA: Diagnosis present

## 2018-09-13 DIAGNOSIS — Z7902 Long term (current) use of antithrombotics/antiplatelets: Secondary | ICD-10-CM

## 2018-09-13 DIAGNOSIS — N390 Urinary tract infection, site not specified: Secondary | ICD-10-CM

## 2018-09-13 DIAGNOSIS — R0989 Other specified symptoms and signs involving the circulatory and respiratory systems: Secondary | ICD-10-CM | POA: Diagnosis not present

## 2018-09-13 DIAGNOSIS — Z79899 Other long term (current) drug therapy: Secondary | ICD-10-CM

## 2018-09-13 DIAGNOSIS — R000 Tachycardia, unspecified: Secondary | ICD-10-CM | POA: Diagnosis not present

## 2018-09-13 LAB — LACTIC ACID, PLASMA
Lactic Acid, Venous: 2.4 mmol/L (ref 0.5–1.9)
Lactic Acid, Venous: 2.2 mmol/L (ref 0.5–1.9)

## 2018-09-13 LAB — CBC WITH DIFFERENTIAL/PLATELET
Abs Immature Granulocytes: 0.28 10*3/uL — ABNORMAL HIGH (ref 0.00–0.07)
BASOS ABS: 0.1 10*3/uL (ref 0.0–0.1)
Basophils Relative: 0 %
Eosinophils Absolute: 0 10*3/uL (ref 0.0–0.5)
Eosinophils Relative: 0 %
HCT: 50.3 % (ref 39.0–52.0)
HEMOGLOBIN: 16.2 g/dL (ref 13.0–17.0)
IMMATURE GRANULOCYTES: 1 %
LYMPHS ABS: 0.9 10*3/uL (ref 0.7–4.0)
LYMPHS PCT: 4 %
MCH: 28.8 pg (ref 26.0–34.0)
MCHC: 32.2 g/dL (ref 30.0–36.0)
MCV: 89.5 fL (ref 80.0–100.0)
MONOS PCT: 8 %
Monocytes Absolute: 2.1 10*3/uL — ABNORMAL HIGH (ref 0.1–1.0)
NEUTROS PCT: 87 %
NRBC: 0 % (ref 0.0–0.2)
Neutro Abs: 21.8 10*3/uL — ABNORMAL HIGH (ref 1.7–7.7)
Platelets: 312 10*3/uL (ref 150–400)
RBC: 5.62 MIL/uL (ref 4.22–5.81)
RDW: 15.8 % — ABNORMAL HIGH (ref 11.5–15.5)
WBC: 25.1 10*3/uL — ABNORMAL HIGH (ref 4.0–10.5)

## 2018-09-13 LAB — BASIC METABOLIC PANEL
Anion gap: 15 (ref 5–15)
BUN: 21 mg/dL (ref 8–23)
CO2: 18 mmol/L — ABNORMAL LOW (ref 22–32)
Calcium: 7.8 mg/dL — ABNORMAL LOW (ref 8.9–10.3)
Chloride: 100 mmol/L (ref 98–111)
Creatinine, Ser: 1.2 mg/dL (ref 0.61–1.24)
GFR calc Af Amer: >60 mL/min (ref >60)
GFR calc non Af Amer: >60 mL/min (ref >60)
GLUCOSE: 157 mg/dL — AB (ref 70–99)
Potassium: 3.2 mmol/L — ABNORMAL LOW (ref 3.5–5.1)
Sodium: 133 mmol/L — ABNORMAL LOW (ref 135–145)

## 2018-09-13 LAB — COMPREHENSIVE METABOLIC PANEL
ALK PHOS: 72 U/L (ref 38–126)
ALT: 12 U/L (ref 0–44)
AST: 18 U/L (ref 15–41)
Albumin: 4.4 g/dL (ref 3.5–5.0)
Anion gap: 20 — ABNORMAL HIGH (ref 5–15)
BUN: 24 mg/dL — AB (ref 8–23)
CALCIUM: 9 mg/dL (ref 8.9–10.3)
CHLORIDE: 89 mmol/L — AB (ref 98–111)
CO2: 23 mmol/L (ref 22–32)
CREATININE: 1.51 mg/dL — AB (ref 0.61–1.24)
GFR calc Af Amer: 53 mL/min — ABNORMAL LOW (ref >60)
GFR calc non Af Amer: 46 mL/min — ABNORMAL LOW (ref >60)
Glucose, Bld: 204 mg/dL — ABNORMAL HIGH (ref 70–99)
Potassium: 3 mmol/L — ABNORMAL LOW (ref 3.5–5.1)
Sodium: 132 mmol/L — ABNORMAL LOW (ref 135–145)
Total Bilirubin: 2.1 mg/dL — ABNORMAL HIGH (ref 0.3–1.2)
Total Protein: 8.3 g/dL — ABNORMAL HIGH (ref 6.5–8.1)

## 2018-09-13 LAB — CBC
HEMATOCRIT: 42.8 % (ref 39.0–52.0)
Hemoglobin: 14.1 g/dL (ref 13.0–17.0)
MCH: 29.3 pg (ref 26.0–34.0)
MCHC: 32.9 g/dL (ref 30.0–36.0)
MCV: 89 fL (ref 80.0–100.0)
Platelets: 262 10*3/uL (ref 150–400)
RBC: 4.81 MIL/uL (ref 4.22–5.81)
RDW: 15.5 % (ref 11.5–15.5)
WBC: 22.6 10*3/uL — ABNORMAL HIGH (ref 4.0–10.5)
nRBC: 0 % (ref 0.0–0.2)

## 2018-09-13 LAB — URINALYSIS, ROUTINE W REFLEX MICROSCOPIC
BILIRUBIN URINE: NEGATIVE
Glucose, UA: >=500 mg/dL — AB
KETONES UR: 80 mg/dL — AB
NITRITE: NEGATIVE
PROTEIN: 100 mg/dL — AB
Specific Gravity, Urine: 1.023 (ref 1.005–1.030)
pH: 5 (ref 5.0–8.0)

## 2018-09-13 LAB — MAGNESIUM: MAGNESIUM: 1.8 mg/dL (ref 1.7–2.4)

## 2018-09-13 LAB — TROPONIN I

## 2018-09-13 MED ORDER — SODIUM CHLORIDE 0.9 % IV SOLN: INTRAVENOUS | Status: DC

## 2018-09-13 MED ORDER — SODIUM CHLORIDE 0.9 % IV BOLUS
2000.0000 mL | Freq: Once | INTRAVENOUS | Status: AC
  Administered 2018-09-13: 2000 mL via INTRAVENOUS

## 2018-09-13 MED ORDER — POTASSIUM CHLORIDE 10 MEQ/100ML IV SOLN
10.0000 meq | INTRAVENOUS | Status: AC
  Administered 2018-09-13 (×2): 10 meq via INTRAVENOUS
  Filled 2018-09-13 (×2): qty 100

## 2018-09-13 MED ORDER — SODIUM CHLORIDE 0.9 % IV SOLN
1.0000 g | Freq: Once | INTRAVENOUS | Status: AC
  Administered 2018-09-13: 1 g via INTRAVENOUS
  Filled 2018-09-13: qty 10

## 2018-09-13 MED ORDER — MAGNESIUM SULFATE 2 GM/50ML IV SOLN
2.0000 g | Freq: Once | INTRAVENOUS | Status: AC
  Administered 2018-09-13: 2 g via INTRAVENOUS
  Filled 2018-09-13: qty 50

## 2018-09-13 MED ORDER — SODIUM CHLORIDE 0.9 % IV BOLUS
500.0000 mL | Freq: Once | INTRAVENOUS | Status: AC
  Administered 2018-09-13: 500 mL via INTRAVENOUS

## 2018-09-13 MED ORDER — SODIUM CHLORIDE 0.9 % IV BOLUS: 1000.0000 mL | Freq: Once | INTRAVENOUS | Status: DC

## 2018-09-13 NOTE — ED Notes (Signed)
Tim, [facility] aware of need for magnesium- out of stock in ED pyxis. Okay per Dr Nehemiah Settle to run potassium and magnesium simultaneously.

## 2018-09-13 NOTE — ED Notes (Signed)
Pt pain relieved after catheter insertion- 800 ml returned on insertion- urine continues to drain.

## 2018-09-13 NOTE — ED Notes (Signed)
Spoke with [name]- in process of getting magnesium from upstairs pharmacy.

## 2018-09-13 NOTE — ED Triage Notes (Signed)
Weakness since yesterday  Confusion, "all he wants to do is sleep" per spouse  Called [place] who sent to clinic in [place], they sent him to ED   Not taking insulin shot

## 2018-09-13 NOTE — ED Provider Notes (Signed)
[facility] EMERGENCY DEPARTMENT Provider Note   CSN: [id number] Arrival date & time: 09/13/18  1421     History   Chief Complaint Chief Complaint  Patient presents with  . Fatigue  . Urinary Frequency    HPI [name] is a 73 y.o. male.  The history is provided by the patient and the spouse. The history is limited by the condition of the patient (confusion).  Urinary Frequency   Pt was seen at 1555. Per pt and his wife: Pt's wife states pt has been increasingly fatigued for the past 2 days States he spent the day in bed today. Has been associated with generalized weakness, decreased PO intake, and intermittent confusion. Pt's wife states he has been "urinating frequently," and pt c/o dysuria. Pt also has not been taking his meds.  Denies falls, no focal motor weakness, no fevers, no back pain, no CP/palpitations, no SOB/cough, no abd pain, no hematuria, no testicular pain/swelling.    Past Medical History:  Diagnosis Date  . CAD S/P percutaneous coronary angioplasty 1998   PCI TO CX  . CKD (chronic kidney disease) stage 3, GFR 30-59 ml/min (HCC)   . Diabetes mellitus without complication ([place])   . Diverticulitis 08/16/2017   hospitalized with diverticulitis/sepsis  . GERD (gastroesophageal reflux disease)    occassionally  . 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 Hypercholesteremia   . Hypertension   . Neuropathy   . Peripheral vascular disease ([place])    s/p R-L FEM-FEM BYPASS  . Pneumonia   . Tachycardia    after CABG, pt on medicine for this    Patient Active Problem List   Diagnosis Date Noted  . Elevated troponin   . Acute post-hemorrhagic anemia 03/27/2018  . Acute post-operative pain 03/27/2018  . S/P colostomy takedown 03/26/2018  . Syncope 03/26/2018  . Prolonged Q-T interval on ECG 03/26/2018  . Colostomy in place [facility]) 12/16/2017  . Preoperative cardiovascular examination   . Hypertension 12/12/2017  . Type 2 diabetes mellitus with diabetic neuropathy  ([place]) 12/12/2017  . CKD (chronic kidney disease), stage III ([place]) 12/12/2017  . Hypercholesteremia 12/12/2017  . Acute hypokalemia 12/12/2017  . GI bleed 12/06/2017  . Rectal bleed 12/06/2017  . Uncontrolled type 2 diabetes mellitus with hyperglycemia, with long-term current use of insulin ([place]) 12/06/2017  . Acute renal failure superimposed on stage 3 chronic kidney disease ([place]) 12/06/2017  . Malnutrition of moderate degree 12/06/2017  . Abnormal CT scan, colon 11/23/2017  . Constipation 11/23/2017  . Lobar pneumonia ([place]) 10/02/2017  . Sepsis due to pneumonia ([place]) 10/01/2017  . Diverticulitis large intestine 08/19/2017  . Diverticulitis of large intestine with perforation and abscess without bleeding   . 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 CKD (chronic kidney disease) stage 3, GFR 30-59 ml/min (HCC) 08/02/2017  . Sepsis due to undetermined organism ([place]) 08/01/2017  . GERD (gastroesophageal reflux disease) 08/01/2017  . Bacteremia 06/05/2017  . SIRS (systemic inflammatory response syndrome) ([place]) 05/31/2017  . PAD (peripheral artery disease) ([place]) 08/09/2016  . Claudication ([place]) 05/08/2016  . HCAP (healthcare-associated pneumonia) 01/12/2016  . Hyponatremia 01/12/2016  . Abnormal chest CT 10/13/2015  . Hx of CABG 10/13/2015  . Type 2 diabetes, HbA1c goal < 7% (HCC)   . Unstable angina ([place])   . Hypomagnesemia   . Healthcare-associated pneumonia 10/09/2015  . Type 2 diabetes mellitus ([place]) 10/09/2015  . ASCVD (arteriosclerotic cardiovascular disease) 10/09/2015  . CKD (chronic kidney disease) stage  2, GFR 60-89 ml/min 10/09/2015  . CAP (community acquired pneumonia) 07/09/2015  . Sepsis ([place]) 07/09/2015  . 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 PVD (peripheral vascular disease) ([place]) 07/09/2015  . Essential hypertension 07/09/2015  . Hypotension 07/09/2015  . Neuropathy, peripheral 07/09/2015  . HLD (hyperlipidemia) 07/09/2015  . AKI  (acute kidney injury) ([place]) 07/09/2015  . CAD S/P percutaneous coronary angioplasty 08/14/1996    Past Surgical History:  Procedure Laterality Date  . ABDOMINAL AORTOGRAM Bilateral 09/19/2016   Procedure: iliac;  Surgeon: Serafina Mitchell, MD;  Location: [place] [facility];  Service: Cardiovascular;  Laterality: Bilateral;  . CARDIAC CATHETERIZATION  2003   with stent  . CARDIAC CATHETERIZATION N/A 10/11/2015   Procedure: Left Heart Cath and Coronary Angiography;  Surgeon: Peter M Martinique, MD;  Location: [place] [facility];  Service: Cardiovascular;  Laterality: N/A;  . COLON RESECTION N/A 12/13/2017   Procedure: EXPLORATORY LAPAROTOMY, SIGMOID COLECTOMY WITH COLOSTOMY;  Surgeon: Donnie Mesa, MD;  Location: [place];  Service: General;  Laterality: N/A;  . COLONOSCOPY N/A 09/22/2013   Procedure: COLONOSCOPY;  Surgeon: Daneil Dolin, MD;  [facility];  Service: Endoscopy;  Laterality: N/A;  9:30 AM  . COLONOSCOPY N/A 02/19/2018   Procedure: COLONOSCOPY;  Surgeon: Daneil Dolin, MD;  [facility];  Service: Endoscopy;  Laterality: N/A;  2:00pm  . COLOSTOMY N/A 12/13/2017   Procedure: COLOSTOMY;  Surgeon: Donnie Mesa, MD;  Location: [place];  Service: General;  Laterality: N/A;  . COLOSTOMY REVERSAL N/A 03/26/2018   Procedure: COLOSTOMY REVERSAL;  Surgeon: Donnie Mesa, MD;  Location: [place];  Service: General;  Laterality: N/A;  . CORONARY ARTERY BYPASS GRAFT N/A 10/18/2015   Procedure: CORONARY ARTERY BYPASS GRAFTING (CABG) x  five, using left internal mammary artery and right leg greater saphenous vein harvested endoscopically;  Surgeon: [name], MD;  Location: [place];  Service: Open Heart Surgery;  Laterality: N/A;  . ENDARTERECTOMY FEMORAL Right 08/09/2016   Procedure: ENDARTERECTOMY FEMORAL WITH VEIN PATCH ANGIOPLASTY;  Surgeon: Serafina Mitchell, MD;  Location: [facility];  Service: Vascular;  Laterality: Right;  . ESOPHAGOGASTRODUODENOSCOPY N/A 10/24/2017   Dr. Gala Romney:  hiatal hernia  . FEMORAL-FEMORAL BYPASS GRAFT Bilateral 08/09/2016   Procedure: REVISION BYPASS GRAFT RIGHT FEMORAL-LEFT FEMORAL ARTERY;  Surgeon: Serafina Mitchell, MD;  Location: [place];  Service: Vascular;  Laterality: Bilateral;  . FEMORAL-POPLITEAL BYPASS GRAFT    . PERIPHERAL VASCULAR CATHETERIZATION N/A 05/08/2016   Procedure: Lower Extremity Angiography;  Surgeon: Lorretta Harp, MD;  Location: [place] [facility];  Service: Cardiovascular;  Laterality: N/A;  . PERIPHERAL VASCULAR INTERVENTION Right 09/19/2016   Procedure: Peripheral Vascular Intervention;  Surgeon: Serafina Mitchell, MD;  Location: [place] [facility];  Service: Cardiovascular;  Laterality: Right;  ext iliac stent  . POLYPECTOMY  02/19/2018   Procedure: POLYPECTOMY;  Surgeon: Daneil Dolin, MD;  [facility];  Service: Endoscopy;;  . PROCTOSCOPY  10/24/2017   Procedure: PROCTOSCOPY;  Surgeon: Daneil Dolin, MD;  [facility];  Service: Endoscopy;;  . TEE WITHOUT CARDIOVERSION N/A 10/18/2015   Procedure: TRANSESOPHAGEAL ECHOCARDIOGRAM (TEE);  Surgeon: [name], MD;  Location: [place];  Service: Open Heart Surgery;  Laterality: N/A;        Home Medications    Prior to Admission medications   Medication Sig Start Date End Date Taking? Authorizing Provider  albuterol (PROVENTIL HFA;VENTOLIN HFA) 108 (90 Base)  MCG/ACT inhaler Inhale 2 puffs into the lungs every 6 (six) hours as needed for wheezing or shortness of breath. 10/03/17   Kathie Dike, MD  amLODipine (NORVASC) 10 MG tablet Take 1 tablet (10 mg total) by mouth daily. 04/01/18   Regalado, Belkys A, MD  aspirin 81 MG tablet Take 81 mg by mouth daily.    [provider]  Cholecalciferol (VITAMIN D) 2000 UNITS tablet Take 2,000 Units by mouth daily.    [provider]  cilostazol (PLETAL) 100 MG tablet Take 100 mg by mouth 2 (two) times daily.    [provider]  clopidogrel (PLAVIX) 75 MG tablet Take 75 mg by  mouth daily.    [provider]  ferrous sulfate 325 (65 FE) MG tablet Take 325 mg by mouth 2 (two) times daily with a meal.     [provider]  gabapentin (NEURONTIN) 300 MG capsule Take 300 mg by mouth 3 (three) times daily.     [provider]  insulin glargine (LANTUS) 100 UNIT/ML injection Inject 0.08 mLs (8 Units total) into the skin at bedtime. Patient taking differently: Inject 25 Units into the skin at bedtime.  04/01/18   Regalado, Belkys A, MD  metFORMIN (GLUCOPHAGE) 500 MG tablet Take 1,000 mg by mouth 2 (two) times daily with a meal.     [provider]  metoprolol tartrate (LOPRESSOR) 25 MG tablet Take 25 mg by mouth 2 (two) times daily.    [provider]  pantoprazole (PROTONIX) 40 MG tablet Take 1 tablet (40 mg total) by mouth daily. 08/19/17   Dhungel, Flonnie Overman, MD  simvastatin (ZOCOR) 80 MG tablet Take 40 mg by mouth at bedtime.     [provider]  vitamin B-12 (CYANOCOBALAMIN) 1000 MCG tablet Take 1,000 mcg by mouth daily.    [provider]    Family History Family History  Problem Relation Age of Onset  . Heart attack Mother   . Stroke Mother   . Colon cancer Brother        late 65s    Social History Social History   Tobacco Use  . Smoking status: Former Smoker    Packs/day: 2.00    Years: 30.00    Pack years: 60.00    Types: Cigarettes    Last attempt to quit: 08/14/2000    Years since quitting: 18.0  . Smokeless tobacco: Never Used  Substance Use Topics  . Alcohol use: No  . Drug use: No     Allergies   Patient has no known allergies.   Review of Systems Review of Systems  Unable to perform ROS: Mental status change  Genitourinary: Positive for frequency.     Physical Exam Updated Vital Signs BP (!) 177/89 (BP Location: Right Arm)   Pulse (!) 121   Temp 98.4 F (36.9 C) (Oral)   Resp 15   Ht 5\' 7"  (1.702 m)   Wt 81.6 kg   SpO2 98%   BMI 28.19 kg/m    Patient Vitals for the  past 24 hrs:  BP Temp Temp src Pulse Resp SpO2 Height Weight  09/13/18 1518 (!) 177/89 98.4 F (36.9 C) Oral (!) 121 15 98 % - -  09/13/18 1517 - - - - - - 5\' 7"  (1.702 m) 81.6 kg    16:32:57 Orthostatic Vital Signs DM  Orthostatic Lying   BP- Lying: 163/95Abnormal   Pulse- Lying: 114 (Sats 98% on RA. denies dizziness.)  Orthostatic Sitting  BP- Sitting: 147/89  Pulse- Sitting: 115 (Sats 98% on RA, denies dizziness. )      Orthostatic Standing at 0 minutes  BP- Standing at 0 minutes: 145/84  Pulse- Standing at 0 minutes: 110 (Sats 95 on RA Denies dizziness)      Orthostatic Standing at 3 minutes  BP- Standing at 3 minutes: 136/90  Pulse- Standing at 3 minutes: 123 (denies dizziness. Sats 97% on RA. c/o weakness during this BP. Had to sit down before completing task. )     Physical Exam 1600: Physical examination:  Nursing notes reviewed; Vital signs and O2 SAT reviewed;  Constitutional: Well developed, Well nourished, In no acute distress; Head:  Normocephalic, atraumatic; Eyes: EOMI, PERRL, No scleral icterus; ENMT: Mouth and pharynx normal, Mucous membranes dry; Neck: Supple, Full range of motion, No lymphadenopathy. No meningeal signs.; Cardiovascular: Tachycardic rate and rhythm, No gallop; Respiratory: Breath sounds clear & equal bilaterally, No wheezes.  Speaking full sentences with ease, Normal respiratory effort/excursion; Chest: Nontender, Movement normal; Abdomen: Soft, Nontender, Nondistended, Normal bowel sounds; Genitourinary: No CVA tenderness; Extremities: Peripheral pulses normal, No tenderness, No edema, No calf edema or asymmetry.; Neuro: Awake, alert, mildly confused regarding events, day. Major CN grossly intact. No facial droop. Speech clear. No gross focal motor deficits in extremities.; Skin: Color normal, Warm, Dry.    ED Treatments / Results  Labs (all labs ordered are listed, but only abnormal results are displayed)   EKG EKG  Interpretation  Date/Time:  Friday September 13 2018 16:30:09 EST Ventricular Rate:  115 PR Interval:    QRS Duration: 128 QT Interval:  385 QTC Calculation: 533 R Axis:   81 Text Interpretation:  Ectopic atrial tachycardia, unifocal Nonspecific intraventricular conduction delay Baseline wander When compared with ECG of 2/18/219 QT has lengthened Confirmed by Francine Graven [phone]) on 09/13/2018 4:59:18 PM   Radiology   Procedures Procedures (including critical care time)  Medications Ordered in ED Medications - No data to display   Initial Impression / Assessment and Plan / ED Course  I have reviewed the triage vital signs and the nursing notes.  Pertinent labs & imaging results that were available during my care of the patient were reviewed by me and considered in my medical decision making (see chart for details).  MDM Reviewed: previous chart, nursing note and vitals Reviewed previous: labs and ECG Interpretation: labs, ECG, x-ray and CT scan   Results for orders placed or performed during the hospital encounter of 09/13/18  Comprehensive metabolic panel  Result Value Ref Range   Sodium 132 (L) 135 - 145 mmol/L   Potassium 3.0 (L) 3.5 - 5.1 mmol/L   Chloride 89 (L) 98 - 111 mmol/L   CO2 23 22 - 32 mmol/L   Glucose, Bld 204 (H) 70 - 99 mg/dL   BUN 24 (H) 8 - 23 mg/dL   Creatinine, Ser 1.51 (H) 0.61 - 1.24 mg/dL   Calcium 9.0 8.9 - 10.3 mg/dL   Total Protein 8.3 (H) 6.5 - 8.1 g/dL   Albumin 4.4 3.5 - 5.0 g/dL   AST 18 15 - 41 U/L   ALT 12 0 - 44 U/L   Alkaline Phosphatase 72 38 - 126 U/L   Total Bilirubin 2.1 (H) 0.3 - 1.2 mg/dL   GFR calc non Af Amer 46 (L) >60 mL/min   GFR calc Af Amer 53 (L) >60 mL/min   Anion gap 20 (H) 5 - 15  Troponin I - Once  Result Value Ref Range   Troponin I <0.03 <0.03 ng/mL  Lactic acid, plasma  Result Value Ref Range   Lactic Acid, Venous 2.4 (HH) 0.5 - 1.9 mmol/L  CBC with Differential  Result Value Ref Range   WBC 25.1  (H) 4.0 - 10.5 K/uL   RBC 5.62 4.22 - 5.81 MIL/uL   Hemoglobin 16.2 13.0 - 17.0 g/dL   HCT 50.3 39.0 - 52.0 %   MCV 89.5 80.0 - 100.0 fL   MCH 28.8 26.0 - 34.0 pg   MCHC 32.2 30.0 - 36.0 g/dL   RDW 15.8 (H) 11.5 - 15.5 %   Platelets 312 150 - 400 K/uL   nRBC 0.0 0.0 - 0.2 %   Neutrophils Relative % 87 %   Neutro Abs 21.8 (H) 1.7 - 7.7 K/uL   Lymphocytes Relative 4 %   Lymphs Abs 0.9 0.7 - 4.0 K/uL   Monocytes Relative 8 %   Monocytes Absolute 2.1 (H) 0.1 - 1.0 K/uL   Eosinophils Relative 0 %   Eosinophils Absolute 0.0 0.0 - 0.5 K/uL   Basophils Relative 0 %   Basophils Absolute 0.1 0.0 - 0.1 K/uL   Immature Granulocytes 1 %   Abs Immature Granulocytes 0.28 (H) 0.00 - 0.07 K/uL   Reactive, Benign Lymphocytes PRESENT   Urinalysis, Routine w reflex microscopic  Result Value Ref Range   Color, Urine YELLOW YELLOW   APPearance HAZY (A) CLEAR   Specific Gravity, Urine 1.023 1.005 - 1.030   pH 5.0 5.0 - 8.0   Glucose, UA >=500 (A) NEGATIVE mg/dL   Hgb urine dipstick MODERATE (A) NEGATIVE   Bilirubin Urine NEGATIVE NEGATIVE   Ketones, ur 80 (A) NEGATIVE mg/dL   Protein, ur 100 (A) NEGATIVE mg/dL   Nitrite NEGATIVE NEGATIVE   Leukocytes, UA TRACE (A) NEGATIVE   RBC / HPF 6-10 0 - 5 RBC/hpf   WBC, UA 21-50 0 - 5 WBC/hpf   Bacteria, UA RARE (A) NONE SEEN   Squamous Epithelial / LPF 0-5 0 - 5  Magnesium  Result Value Ref Range   Magnesium 1.8 1.7 - 2.4 mg/dL   Dg Chest 2 View Result Date: 09/13/2018 CLINICAL DATA:  Weakness, chest pain and congestion. EXAM: CHEST - 2 VIEW COMPARISON:  12/12/2017 and older exams. FINDINGS: Stable changes from prior CABG surgery. The cardiac silhouette is normal in size and configuration. No mediastinal or hilar masses or evidence of adenopathy. Clear lungs.  No pleural effusion or pneumothorax. Skeletal structures are intact. IMPRESSION: No active cardiopulmonary disease. Electronically Signed   By: Lajean Manes M.D.   On: 09/13/2018 17:11   Ct  Head Wo Contrast Result Date: 09/13/2018 CLINICAL DATA:  Weakness since yesterday.  Confusion. EXAM: CT HEAD WITHOUT CONTRAST TECHNIQUE: Contiguous axial images were obtained from the base of the skull through the vertex without intravenous contrast. COMPARISON:  Brain MRI, 03/29/2018.  Head CT, 08/17/2017. FINDINGS: Brain: No evidence of acute infarction, hemorrhage, hydrocephalus, extra-axial collection or mass lesion/mass effect. There is ventricular and sulcal enlargement reflecting mild diffuse atrophy. 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 Vascular: No hyperdense vessel or unexpected calcification. Skull: Normal. Negative for fracture or focal lesion. Sinuses/Orbits: Globes and orbits are unremarkable. The visualized sinuses and mastoid air cells are clear. Other: None. IMPRESSION: 1. No acute intracranial abnormalities. 2. Mild diffuse atrophy. Electronically Signed   By: Lajean Manes M.D.   On: 09/13/2018 16:55    1750:  Pt not  orthostatic on VS; only c/o generalized weakness. Pt's BUN/Cr elevated, and pt is tachycardic, clinically appears dehydrated; IVF bolus and gtt started.  IV magnesium and potassium ordered. IV rocephin ordered for UTI, UC and BC pending. Pt remains NAD, resps easy, abd soft/NT, neuro exam non-focal.  Dx and testing d/w pt and family.  Questions answered.  Verb understanding, agreeable to admit.  T/C returned from [facility] Dr. Nehemiah Settle, case discussed, including:  HPI, pertinent PM/SHx, VS/PE, dx testing, ED course and treatment:  Agreeable to admit.     Final Clinical Impressions(s) / ED Diagnoses   Final diagnoses:  None    ED Discharge Orders    None       Francine Graven, DO 09/14/18 1358

## 2018-09-13 NOTE — H&P (Signed)
History and Physical  [name] FFM:[id number] DOB: [birth year] DOA: 09/13/2018  Referring physician: Thurnell Garbe, ED physician PCP: [name], MD  Outpatient Specialists: none  Patient Coming From: home  Chief Complaint: confusion, urinary frequency  HPI: [name] is a 73 y.o. male with a history of hypertension, GERD diabetes, stage II chronic kidney disease, coronary artery disease status post 5 vessel CABG, peripheral peripheral vascular disease status post right and left femoral bypass.  Patient started having frequency and dysuria yesterday morning that it worsened throughout the course of the day.  Patient also became more confused as the day went on.  Normally he is able to recall his medications without difficulty.  However he was unable to really recall his medications here in the emergency department.  No fevers, chills, nausea, vomiting.  No palliating or provoking factors.  Emergency Department Course: White count 25, lactic acid 2.4 with a repeat of 2.2.  Creatinine 1.51 with baseline of 1.02.  CT negative.  No acidosis on CMP.  Review of Systems:   Pt denies any fevers, chills, nausea, vomiting, diarrhea, constipation, abdominal pain, shortness of breath, dyspnea on exertion, orthopnea, cough, wheezing, palpitations, headache, vision changes, lightheadedness, dizziness, melena, rectal bleeding.  Review of systems are otherwise negative  Past Medical History:  Diagnosis Date  . CAD S/P percutaneous coronary angioplasty 1998   PCI TO CX  . CKD (chronic kidney disease) stage 3, GFR 30-59 ml/min (HCC)   . Diabetes mellitus without complication ([place])   . Diverticulitis 08/16/2017   hospitalized with diverticulitis/sepsis  . GERD (gastroesophageal reflux disease)    occassionally  . 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 Hypercholesteremia   . Hypertension   . Neuropathy   . Peripheral vascular disease ([place])    s/p R-L FEM-FEM BYPASS  . Pneumonia   . Tachycardia    after CABG, pt on medicine for this   Past Surgical History:  Procedure Laterality Date  . ABDOMINAL AORTOGRAM Bilateral 09/19/2016   Procedure: iliac;  Surgeon: Serafina Mitchell, MD;  Location: [facility];  Service: Cardiovascular;  Laterality: Bilateral;  . CARDIAC CATHETERIZATION  2003   with stent  . CARDIAC CATHETERIZATION N/A 10/11/2015   Procedure: Left Heart Cath and Coronary Angiography;  Surgeon: Peter M Martinique, MD;  Location: [facility];  Service: Cardiovascular;  Laterality: N/A;  . COLON RESECTION N/A 12/13/2017   Procedure: EXPLORATORY LAPAROTOMY, SIGMOID COLECTOMY WITH COLOSTOMY;  Surgeon: Donnie Mesa, MD;  Location: [facility];  Service: General;  Laterality: N/A;  . COLONOSCOPY N/A 09/22/2013   Procedure: COLONOSCOPY;  Surgeon: Daneil Dolin, MD;  [facility];  Service: Endoscopy;  Laterality: N/A;  9:30 AM  . COLONOSCOPY N/A 02/19/2018   Procedure: COLONOSCOPY;  Surgeon: Daneil Dolin, MD;  [facility];  Service: Endoscopy;  Laterality: N/A;  2:00pm  . COLOSTOMY N/A 12/13/2017   Procedure: COLOSTOMY;  Surgeon: Donnie Mesa, MD;  Location: [facility];  Service: General;  Laterality: N/A;  . COLOSTOMY REVERSAL N/A 03/26/2018   Procedure: COLOSTOMY REVERSAL;  Surgeon: Donnie Mesa, MD;  Location: [facility];  Service: General;  Laterality: N/A;  . CORONARY ARTERY BYPASS GRAFT N/A 10/18/2015   Procedure: CORONARY ARTERY BYPASS GRAFTING (CABG) x  five, using left internal mammary artery and right leg greater saphenous vein harvested endoscopically;  Surgeon: [name], MD;  Location: [facility];  Service: Open Heart Surgery;  Laterality: N/A;  . ENDARTERECTOMY  FEMORAL Right 08/09/2016   Procedure: ENDARTERECTOMY FEMORAL WITH VEIN PATCH ANGIOPLASTY;  Surgeon: Serafina Mitchell, MD;  Location: [facility];  Service: Vascular;  Laterality: Right;  . ESOPHAGOGASTRODUODENOSCOPY N/A 10/24/2017   Dr. Gala Romney: hiatal hernia  . FEMORAL-FEMORAL BYPASS GRAFT Bilateral  08/09/2016   Procedure: REVISION BYPASS GRAFT RIGHT FEMORAL-LEFT FEMORAL ARTERY;  Surgeon: Serafina Mitchell, MD;  Location: [facility];  Service: Vascular;  Laterality: Bilateral;  . FEMORAL-POPLITEAL BYPASS GRAFT    . PERIPHERAL VASCULAR CATHETERIZATION N/A 05/08/2016   Procedure: Lower Extremity Angiography;  Surgeon: Lorretta Harp, MD;  Location: [facility];  Service: Cardiovascular;  Laterality: N/A;  . PERIPHERAL VASCULAR INTERVENTION Right 09/19/2016   Procedure: Peripheral Vascular Intervention;  Surgeon: Serafina Mitchell, MD;  Location: [facility];  Service: Cardiovascular;  Laterality: Right;  ext iliac stent  . POLYPECTOMY  02/19/2018   Procedure: POLYPECTOMY;  Surgeon: Daneil Dolin, MD;  [facility];  Service: Endoscopy;;  . PROCTOSCOPY  10/24/2017   Procedure: PROCTOSCOPY;  Surgeon: Daneil Dolin, MD;  [facility];  Service: Endoscopy;;  . TEE WITHOUT CARDIOVERSION N/A 10/18/2015   Procedure: TRANSESOPHAGEAL ECHOCARDIOGRAM (TEE);  Surgeon: [name], MD;  Location: [facility];  Service: Open Heart Surgery;  Laterality: N/A;   Social History:  reports that he quit smoking about 18 years ago. His smoking use included cigarettes. He has a 60.00 pack-year smoking history. He has never used smokeless tobacco. He reports that he does not drink alcohol or use drugs. Patient lives at home  No Known Allergies  Family History  Problem Relation Age of Onset  . Heart attack Mother   . Stroke Mother   . Colon cancer Brother        late 53s      Prior to Admission medications   Medication Sig Start Date End Date Taking? Authorizing Provider  albuterol (PROVENTIL HFA;VENTOLIN HFA) 108 (90 Base) MCG/ACT inhaler Inhale 2 puffs into the lungs every 6 (six) hours as needed for wheezing or shortness of breath. 10/03/17  Yes Kathie Dike, MD  amLODipine (NORVASC) 10 MG tablet Take 1 tablet (10 mg total) by mouth daily. 04/01/18  Yes Regalado, Belkys A, MD    aspirin 81 MG tablet Take 81 mg by mouth daily.   Yes [provider]  Cholecalciferol (VITAMIN D) 2000 UNITS tablet Take 2,000 Units by mouth daily.   Yes [provider]  cilostazol (PLETAL) 100 MG tablet Take 100 mg by mouth 2 (two) times daily.   Yes [provider]  clopidogrel (PLAVIX) 75 MG tablet Take 75 mg by mouth daily.   Yes [provider]  ferrous sulfate 325 (65 FE) MG tablet Take 325 mg by mouth 2 (two) times daily with a meal.    Yes [provider]  gabapentin (NEURONTIN) 300 MG capsule Take 300 mg by mouth 3 (three) times daily.    Yes [provider]  insulin glargine (LANTUS) 100 UNIT/ML injection Inject 0.08 mLs (8 Units total) into the skin at bedtime. Patient taking differently: Inject 25 Units into the skin at bedtime.  04/01/18  Yes Regalado, Belkys A, MD  metFORMIN (GLUCOPHAGE) 500 MG tablet Take 1,000 mg by mouth 2 (two) times daily with a meal.    Yes [provider]  metoprolol tartrate (LOPRESSOR) 25 MG tablet Take 25 mg by mouth 2 (two) times daily.   Yes [provider]  pantoprazole (PROTONIX) 40 MG tablet  Take 1 tablet (40 mg total) by mouth daily. 08/19/17  Yes Dhungel, Nishant, MD  simvastatin (ZOCOR) 80 MG tablet Take 40 mg by mouth at bedtime.    Yes [provider]  vitamin B-12 (CYANOCOBALAMIN) 1000 MCG tablet Take 1,000 mcg by mouth daily.   Yes [provider]    Physical Exam: BP (!) 186/91   Pulse (!) 111   Temp 98.4 F (36.9 C) (Oral)   Resp 16   Ht 5\' 7"  (1.702 m)   Wt 81.6 kg   SpO2 97%   BMI 28.19 kg/m   . General: Elderly Caucasian male. Awake and alert and oriented x3. No acute cardiopulmonary distress.  [name] HEENT: Normocephalic atraumatic.  Right and left ears normal in appearance.  Pupils equal, round, reactive to light. Extraocular muscles are intact. Sclerae anicteric and noninjected.  Moist mucosal membranes. No mucosal lesions.  . Neck: Neck  supple without lymphadenopathy. No carotid bruits. No masses palpated.  . Cardiovascular: Regular rate with normal S1-S2 sounds. No murmurs, rubs, gallops auscultated. No JVD.  [name] Respiratory: Good respiratory effort with no wheezes, rales, rhonchi. Lungs clear to auscultation bilaterally.  No accessory muscle use. . Abdomen: Soft, prepubic tenderness, nondistended. Active bowel sounds. No masses or hepatosplenomegaly  . Skin: No rashes, lesions, or ulcerations.  Dry, warm to touch. 2+ dorsalis pedis and radial pulses. . Musculoskeletal: No CVA tenderness.  No calf or leg pain. All major joints not erythematous nontender.  No upper or lower joint deformation.  Good ROM.  No contractures  . Psychiatric: Intact judgment and insight. Pleasant and cooperative. . Neurologic: No focal neurological deficits. Strength is 5/5 and symmetric in upper and lower extremities.  Cranial nerves II through XII are grossly intact.           Labs on Admission: I have personally reviewed following labs and imaging studies  CBC: Recent Labs  Lab 09/13/18 1617  WBC 25.1*  NEUTROABS 21.8*  HGB 16.2  HCT 50.3  MCV 89.5  PLT 626   Basic Metabolic Panel: Recent Labs  Lab 09/13/18 1617  NA 132*  K 3.0*  CL 89*  CO2 23  GLUCOSE 204*  BUN 24*  CREATININE 1.51*  CALCIUM 9.0  MG 1.8   GFR: Estimated Creatinine Clearance: 45.9 mL/min (A) (by C-G formula based on SCr of 1.51 mg/dL (H)). Liver Function Tests: Recent Labs  Lab 09/13/18 1617  AST 18  ALT 12  ALKPHOS 72  BILITOT 2.1*  PROT 8.3*  ALBUMIN 4.4   No results for input(s): LIPASE, AMYLASE in the last 168 hours. No results for input(s): AMMONIA in the last 168 hours. Coagulation Profile: No results for input(s): INR, PROTIME in the last 168 hours. Cardiac Enzymes: Recent Labs  Lab 09/13/18 1617  TROPONINI <0.03   BNP (last 3 results) No results for input(s): PROBNP in the last 8760 hours. HbA1C: No results for input(s): HGBA1C in  the last 72 hours. CBG: No results for input(s): GLUCAP in the last 168 hours. Lipid Profile: No results for input(s): CHOL, HDL, LDLCALC, TRIG, CHOLHDL, LDLDIRECT in the last 72 hours. Thyroid Function Tests: No results for input(s): TSH, T4TOTAL, FREET4, T3FREE, THYROIDAB in the last 72 hours. Anemia Panel: No results for input(s): VITAMINB12, FOLATE, FERRITIN, TIBC, IRON, RETICCTPCT in the last 72 hours. Urine analysis:    Component Value Date/Time   COLORURINE YELLOW 09/13/2018 1556   APPEARANCEUR HAZY (A) 09/13/2018 1556   LABSPEC 1.023 09/13/2018 1556   PHURINE 5.0  09/13/2018 1556   GLUCOSEU >=500 (A) 09/13/2018 1556   HGBUR MODERATE (A) 09/13/2018 1556   BILIRUBINUR NEGATIVE 09/13/2018 1556   KETONESUR 80 (A) 09/13/2018 1556   PROTEINUR 100 (A) 09/13/2018 1556   UROBILINOGEN 0.2 03/16/2015 0120   NITRITE NEGATIVE 09/13/2018 1556   LEUKOCYTESUR TRACE (A) 09/13/2018 1556   Sepsis Labs: @LABRCNTIP (procalcitonin:4,lacticidven:4) )No results found for this or any previous visit (from the past 240 hour(s)).   Radiological Exams on Admission: Dg Chest 2 View  Result Date: 09/13/2018 CLINICAL DATA:  Weakness, chest pain and congestion. EXAM: CHEST - 2 VIEW COMPARISON:  12/12/2017 and older exams. FINDINGS: Stable changes from prior CABG surgery. The cardiac silhouette is normal in size and configuration. No mediastinal or hilar masses or evidence of adenopathy. Clear lungs.  No pleural effusion or pneumothorax. Skeletal structures are intact. IMPRESSION: No active cardiopulmonary disease. Electronically Signed   By: Lajean Manes M.D.   On: 09/13/2018 17:11   Ct Head Wo Contrast  Result Date: 09/13/2018 CLINICAL DATA:  Weakness since yesterday.  Confusion. EXAM: CT HEAD WITHOUT CONTRAST TECHNIQUE: Contiguous axial images were obtained from the base of the skull through the vertex without intravenous contrast. COMPARISON:  Brain MRI, 03/29/2018.  Head CT, 08/17/2017. FINDINGS:  Brain: No evidence of acute infarction, hemorrhage, hydrocephalus, extra-axial collection or mass lesion/mass effect. There is ventricular and sulcal enlargement reflecting mild diffuse atrophy. 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 Vascular: No hyperdense vessel or unexpected calcification. Skull: Normal. Negative for fracture or focal lesion. Sinuses/Orbits: Globes and orbits are unremarkable. The visualized sinuses and mastoid air cells are clear. Other: None. IMPRESSION: 1. No acute intracranial abnormalities. 2. Mild diffuse atrophy. Electronically Signed   By: Lajean Manes M.D.   On: 09/13/2018 16:55    EKG: Independently reviewed.  Tachycardia with baseline wander.  No acute ST changes.  Assessment/Plan: Principal Problem:   Sepsis due to undetermined organism [facility]) Active Problems:   Sepsis ([place])   Essential hypertension   AKI (acute kidney injury) ([place])   Type 2 diabetes mellitus ([place])   Hyponatremia   Acute hypokalemia   Acute lower UTI    This patient was discussed with the ED physician, including pertinent vitals, physical exam findings, labs, and imaging.  We also discussed care given by the ED provider.  1. Sepsis a. Admit to stepdown b. Continue IV fluid bolus c. Blood cultures and urine cultures pending 2. UTI a. Continue ceftriaxone b. Continue IV fluids c. Urine culture pending 3. Acute kidney injury a. Secondary to sepsis b. Fluid rehydrate c. Check creatinine later this evening and tomorrow morning 4. Hyponatremia a. Check later this evening 5. Hypokalemia a. Potassium replacement b. Check potassium later this evening 6. Type 2 diabetes a. Continue long-acting insulin with CBGs and sliding scale 7. Hypertension a. New antihypertensives  DVT prophylaxis: Lovenox Consultants: None Code Status: Full Code Family Communication: Wife present Disposition Plan: Should be able to return home following  admission   Truett Mainland, DO

## 2018-09-13 NOTE — ED Notes (Signed)
CRITICAL VALUE ALERT  Critical Value:  Lactic acid 2.2  Date & Time Notied: 09/13/2018    Provider Notified: Dr Thurnell Garbe  Orders Received/Actions taken: see new order

## 2018-09-13 NOTE — ED Notes (Signed)
CRITICAL VALUE ALERT  Critical Value:  Lactic Acid 2.4  Date & Time Notied:  09/13/18 1705  Provider Notified: Dr. Thurnell Garbe  Orders Received/Actions taken: EDP notified

## 2018-09-14 ENCOUNTER — Other Ambulatory Visit: Payer: Self-pay

## 2018-09-14 DIAGNOSIS — N183 Chronic kidney disease, stage 3 (moderate): Secondary | ICD-10-CM

## 2018-09-14 DIAGNOSIS — N390 Urinary tract infection, site not specified: Secondary | ICD-10-CM

## 2018-09-14 LAB — GASTROINTESTINAL PANEL BY PCR, STOOL (REPLACES STOOL CULTURE)

## 2018-09-14 LAB — BASIC METABOLIC PANEL
Anion gap: 11 (ref 5–15)
BUN: 21 mg/dL (ref 8–23)
CO2: 23 mmol/L (ref 22–32)
CREATININE: 1.16 mg/dL (ref 0.61–1.24)
Calcium: 8 mg/dL — ABNORMAL LOW (ref 8.9–10.3)
Chloride: 100 mmol/L (ref 98–111)
GFR calc Af Amer: >60 mL/min (ref >60)
GFR calc non Af Amer: >60 mL/min (ref >60)
Glucose, Bld: 159 mg/dL — ABNORMAL HIGH (ref 70–99)
Potassium: 3.3 mmol/L — ABNORMAL LOW (ref 3.5–5.1)
Sodium: 134 mmol/L — ABNORMAL LOW (ref 135–145)

## 2018-09-14 LAB — CBC
HCT: 42.4 % (ref 39.0–52.0)
Hemoglobin: 14.1 g/dL (ref 13.0–17.0)
MCH: 30.2 pg (ref 26.0–34.0)
MCHC: 33.3 g/dL (ref 30.0–36.0)
MCV: 90.8 fL (ref 80.0–100.0)
Platelets: 266 10*3/uL (ref 150–400)
RBC: 4.67 MIL/uL (ref 4.22–5.81)
RDW: 15.6 % — AB (ref 11.5–15.5)
WBC: 20.1 10*3/uL — ABNORMAL HIGH (ref 4.0–10.5)
nRBC: 0 % (ref 0.0–0.2)

## 2018-09-14 LAB — GLUCOSE, CAPILLARY
Glucose-Capillary: 154 mg/dL — ABNORMAL HIGH (ref 70–99)
Glucose-Capillary: 148 mg/dL — ABNORMAL HIGH (ref 70–99)
Glucose-Capillary: 188 mg/dL — ABNORMAL HIGH (ref 70–99)
Glucose-Capillary: 93 mg/dL (ref 70–99)
Glucose-Capillary: 132 mg/dL — ABNORMAL HIGH (ref 70–99)

## 2018-09-14 LAB — C DIFFICILE QUICK SCREEN W PCR REFLEX
C Diff antigen: POSITIVE — AB
C Diff toxin: NEGATIVE

## 2018-09-14 LAB — MRSA PCR SCREENING: MRSA BY PCR: NEGATIVE

## 2018-09-14 MED ORDER — ATORVASTATIN CALCIUM 40 MG PO TABS
40.0000 mg | ORAL_TABLET | Freq: Every day | ORAL | Status: DC
  Administered 2018-09-14 – 2018-09-15 (×2): 40 mg via ORAL
  Filled 2018-09-14 (×2): qty 1

## 2018-09-14 MED ORDER — POLYETHYLENE GLYCOL 3350 17 G PO PACK: 17.0000 g | PACK | Freq: Every day | ORAL | Status: DC | PRN

## 2018-09-14 MED ORDER — CLOPIDOGREL BISULFATE 75 MG PO TABS
75.0000 mg | ORAL_TABLET | Freq: Every day | ORAL | Status: DC
  Administered 2018-09-14 – 2018-09-16 (×3): 75 mg via ORAL
  Filled 2018-09-14 (×3): qty 1

## 2018-09-14 MED ORDER — ACETAMINOPHEN 325 MG PO TABS
650.0000 mg | ORAL_TABLET | Freq: Four times a day (QID) | ORAL | Status: DC | PRN
  Administered 2018-09-14 – 2018-09-15 (×2): 650 mg via ORAL
  Filled 2018-09-14 (×2): qty 2

## 2018-09-14 MED ORDER — SODIUM CHLORIDE 0.9 % IV SOLN
INTRAVENOUS | Status: DC
  Administered 2018-09-14 (×3): via INTRAVENOUS

## 2018-09-14 MED ORDER — METFORMIN HCL 500 MG PO TABS
1000.0000 mg | ORAL_TABLET | Freq: Two times a day (BID) | ORAL | Status: DC
  Administered 2018-09-14: 1000 mg via ORAL
  Filled 2018-09-14: qty 2

## 2018-09-14 MED ORDER — INSULIN ASPART 100 UNIT/ML ~~LOC~~ SOLN
0.0000 [IU] | Freq: Three times a day (TID) | SUBCUTANEOUS | Status: DC
  Administered 2018-09-14: 3 [IU] via SUBCUTANEOUS
  Administered 2018-09-14: 2 [IU] via SUBCUTANEOUS
  Administered 2018-09-15: 5 [IU] via SUBCUTANEOUS
  Administered 2018-09-15: 3 [IU] via SUBCUTANEOUS
  Administered 2018-09-16 (×2): 2 [IU] via SUBCUTANEOUS

## 2018-09-14 MED ORDER — METOPROLOL TARTRATE 25 MG PO TABS
25.0000 mg | ORAL_TABLET | Freq: Two times a day (BID) | ORAL | Status: DC
  Administered 2018-09-14 – 2018-09-16 (×6): 25 mg via ORAL
  Filled 2018-09-14 (×6): qty 1

## 2018-09-14 MED ORDER — SODIUM CHLORIDE 0.9 % IV SOLN
2.0000 g | INTRAVENOUS | Status: DC
  Administered 2018-09-14 – 2018-09-15 (×2): 2 g via INTRAVENOUS
  Filled 2018-09-14: qty 20
  Filled 2018-09-14 (×2): qty 2

## 2018-09-14 MED ORDER — ASPIRIN EC 81 MG PO TBEC
81.0000 mg | DELAYED_RELEASE_TABLET | Freq: Every day | ORAL | Status: DC
  Administered 2018-09-14 – 2018-09-16 (×3): 81 mg via ORAL
  Filled 2018-09-14 (×3): qty 1

## 2018-09-14 MED ORDER — INSULIN ASPART 100 UNIT/ML ~~LOC~~ SOLN: 0.0000 [IU] | Freq: Every day | SUBCUTANEOUS | Status: DC

## 2018-09-14 MED ORDER — INSULIN GLARGINE 100 UNIT/ML ~~LOC~~ SOLN
25.0000 [IU] | Freq: Every day | SUBCUTANEOUS | Status: DC
  Administered 2018-09-14 – 2018-09-15 (×3): 25 [IU] via SUBCUTANEOUS
  Filled 2018-09-14 (×4): qty 0.25

## 2018-09-14 MED ORDER — POTASSIUM CHLORIDE CRYS ER 20 MEQ PO TBCR
40.0000 meq | EXTENDED_RELEASE_TABLET | Freq: Once | ORAL | Status: AC
  Administered 2018-09-14: 40 meq via ORAL
  Filled 2018-09-14: qty 2

## 2018-09-14 MED ORDER — ONDANSETRON HCL 4 MG/2ML IJ SOLN: 4.0000 mg | Freq: Four times a day (QID) | INTRAMUSCULAR | Status: DC | PRN

## 2018-09-14 MED ORDER — ENOXAPARIN SODIUM 40 MG/0.4ML ~~LOC~~ SOLN
40.0000 mg | SUBCUTANEOUS | Status: DC
  Administered 2018-09-14 – 2018-09-16 (×3): 40 mg via SUBCUTANEOUS
  Filled 2018-09-14 (×3): qty 0.4

## 2018-09-14 MED ORDER — CILOSTAZOL 100 MG PO TABS
100.0000 mg | ORAL_TABLET | Freq: Two times a day (BID) | ORAL | Status: DC
  Administered 2018-09-14 – 2018-09-16 (×5): 100 mg via ORAL
  Filled 2018-09-14 (×5): qty 1

## 2018-09-14 MED ORDER — GABAPENTIN 300 MG PO CAPS
300.0000 mg | ORAL_CAPSULE | Freq: Three times a day (TID) | ORAL | Status: DC
  Administered 2018-09-14 – 2018-09-16 (×7): 300 mg via ORAL
  Filled 2018-09-14 (×7): qty 1

## 2018-09-14 MED ORDER — AMLODIPINE BESYLATE 5 MG PO TABS
10.0000 mg | ORAL_TABLET | Freq: Every day | ORAL | Status: DC
  Administered 2018-09-14 – 2018-09-16 (×3): 10 mg via ORAL
  Filled 2018-09-14 (×3): qty 2

## 2018-09-14 NOTE — Progress Notes (Signed)
PT transferred to 312 via wheelchair with wife belongings, medications, paperwork. No s.s of distress. PT A&Ox4. IV locked off for transport and bag empty.

## 2018-09-14 NOTE — Progress Notes (Signed)
PROGRESS NOTE  [name] UEA:[id number] DOB: [birth year] DOA: 09/13/2018 PCP: [name], MD  Brief History:  73 year old male with a history of coronary artery disease status post CABG, hypertension, hyperlipidemia, diabetes mellitus type 2, peripheral vascular disease, CKD stage III, complicated diverticulitis status post colostomy and colostomy takedown presenting with 2-day history of somnolence and confusion.  The patient's wife noted increasing malaise and somnolence on 09/12/2018 when the patient was laying in bed all day.  Later that evening, the patient had increasing confusion.  During this period of time, the patient did not take his medications including his insulin and antihypertensive medications.  On 09/13/2018, the patient's wife took him to the [place] where he receives his primary care.  They directed the patient to go to urgent care.  At the urgent care, the patient was instructed go to the emergency department for further evaluation.  Apparently, the patient had been complaining of dysuria and urinary frequency.  There was decreased oral intake and worsening generalized weakness.  In the emergency department, the patient was noted to be tachycardic with lactic acid 2.4.  WBC was 25.1.  The patient was admitted for sepsis likely secondary to urinary source.  Notably, the patient had recent admission from 12/12/2017 through 4/78/2956 for complicated diverticulitis.  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  Subsequently, he had reversal of his colostomy on 03/26/2018 (Dr. Gershon Crane)  Assessment/Plan: Sepsis -Present on admission -Secondary to urinary source -Lactic acid peaked at 2.4 -Continue IV fluids -Continue ceftriaxone pending culture data -Increase ceftriaxone to 2 g daily  UTI -UA--21-50 WBC -Continue ceftriaxone pending culture data  Diarrhea -C. difficile assay suggest colonization -Stool pathogen panel -Has been an intermittent  problem since his colostomy reversal 03/26/2018  Acute urinary retention -Likely secondary to cystitis/UTI -Plan for voiding trial in the next 24 to 48 hours  Acute on chronic renal failure--ckd 3 -baseline creatinine 0.9-1.2 -serum creatinine peaked 1.51 -due to sepsis and hypovolemia  Diabetes mellitus type 2, uncontrolled with hyperglycemia -Hemoglobin A1c -NovoLog sliding scale -d/c metformin -continue lantus  Atrial tachycardia -improved after restarting metoprolol  Essential hypertension -restart amlodpine and metoprolol  PVD -continue pletal and aspirin  Hypokalemia -replete -check mag    Disposition Plan:   Home in 2-3 days  Family Communication:   Spouse updated at bedside 2/1  Consultants:  none  Code Status:  FULL   DVT Prophylaxis:  [place] Lovenox   Procedures: As Listed in Progress Note Above  Antibiotics: Ceftriaxone 1/31>>>         Subjective: Patient is feeling better today but still little weak.  He denies any fevers, chills, chest pain, nausea, abdominal pain, nausea, vomiting, diarrhea, dysuria, hematuria.  He has some loose stools.  There is no hematochezia or melena.  There is no hematuria.  He complains of some irritation and pain at the insertion site of his Foley catheter.  Objective: Vitals:   09/14/18 1007 09/14/18 1011 09/14/18 1100 09/14/18 1130  BP: (!) 161/76  (!) 152/73   Pulse:  88 85   Resp:   17   Temp:    97.8 F (36.6 C)  TempSrc:    Oral  SpO2:   95%   Weight:      Height:        Intake/Output Summary (Last 24 hours) at 09/14/2018 1231 Last data filed at 09/14/2018 0300 Gross per 24 hour  Intake 2794.23 ml  Output 2452 ml  Net 342.23 ml   Weight change:  Exam:   General:  Pt is alert, follows commands appropriately, not in acute distress  HEENT: No icterus, No thrush, No neck mass, [place]/AT  Cardiovascular: RRR, S1/S2, no rubs, no gallops  Respiratory: CTA bilaterally, no wheezing, no crackles, no  rhonchi  Abdomen: Soft/+BS, non tender, non distended, no guarding  Extremities: No edema, No lymphangitis, No petechiae, No rashes, no synovitis   Data Reviewed: I have personally reviewed following labs and imaging studies Basic Metabolic Panel: Recent Labs  Lab 09/13/18 1617 09/13/18 2217 09/14/18 0431  NA 132* 133* 134*  K 3.0* 3.2* 3.3*  CL 89* 100 100  CO2 23 18* 23  GLUCOSE 204* 157* 159*  BUN 24* 21 21  CREATININE 1.51* 1.20 1.16  CALCIUM 9.0 7.8* 8.0*  MG 1.8  --   --    Liver Function Tests: Recent Labs  Lab 09/13/18 1617  AST 18  ALT 12  ALKPHOS 72  BILITOT 2.1*  PROT 8.3*  ALBUMIN 4.4   No results for input(s): LIPASE, AMYLASE in the last 168 hours. No results for input(s): AMMONIA in the last 168 hours. Coagulation Profile: No results for input(s): INR, PROTIME in the last 168 hours. CBC: Recent Labs  Lab 09/13/18 1617 09/13/18 2033 09/14/18 0431  WBC 25.1* 22.6* 20.1*  NEUTROABS 21.8*  --   --   HGB 16.2 14.1 14.1  HCT 50.3 42.8 42.4  MCV 89.5 89.0 90.8  PLT 312 262 266   Cardiac Enzymes: Recent Labs  Lab 09/13/18 1617  TROPONINI <0.03   BNP: Invalid input(s): POCBNP CBG: Recent Labs  Lab 09/14/18 0202 09/14/18 0739 09/14/18 1129  GLUCAP 154* 148* 188*   HbA1C: No results for input(s): HGBA1C in the last 72 hours. Urine analysis:    Component Value Date/Time   COLORURINE YELLOW 09/13/2018 1556   APPEARANCEUR HAZY (A) 09/13/2018 1556   LABSPEC 1.023 09/13/2018 1556   PHURINE 5.0 09/13/2018 1556   GLUCOSEU >=500 (A) 09/13/2018 1556   HGBUR MODERATE (A) 09/13/2018 1556   BILIRUBINUR NEGATIVE 09/13/2018 1556   KETONESUR 80 (A) 09/13/2018 1556   PROTEINUR 100 (A) 09/13/2018 1556   UROBILINOGEN 0.2 03/16/2015 0120   NITRITE NEGATIVE 09/13/2018 1556   LEUKOCYTESUR TRACE (A) 09/13/2018 1556   Sepsis Labs: @LABRCNTIP (procalcitonin:4,lacticidven:4) ) Recent Results (from the past 240 hour(s))  Culture, blood (routine x 2)      Status: None (Preliminary result)   Collection Time: 09/13/18  6:20 PM  Result Value Ref Range Status   Specimen Description BLOOD LEFT ARM  Final   Special Requests   Final    BOTTLES DRAWN AEROBIC AND ANAEROBIC Blood Culture adequate volume   Culture   Final    NO GROWTH < 12 HOURS Performed at [facility], [address] [place], [place] [address]    Report Status PENDING  Incomplete  Culture, blood (routine x 2)     Status: None (Preliminary result)   Collection Time: 09/13/18  6:26 PM  Result Value Ref Range Status   Specimen Description BLOOD RIGHT ARM  Final   Special Requests   Final    BOTTLES DRAWN AEROBIC AND ANAEROBIC Blood Culture adequate volume   Culture   Final    NO GROWTH < 12 HOURS Performed at [facility], [address], [place], [place] [address]    Report Status PENDING  Incomplete  MRSA PCR Screening     Status: None  Collection Time: 09/14/18  1:14 AM  Result Value Ref Range Status   MRSA by PCR NEGATIVE NEGATIVE Final    Comment:        The GeneXpert MRSA Assay (FDA approved for NASAL specimens only), is one component of a comprehensive MRSA colonization surveillance program. It is not intended to diagnose MRSA infection nor to guide or monitor treatment for MRSA infections. Performed at [facility], [address], [place], [place] [address]   C difficile quick scan w PCR reflex     Status: Abnormal   Collection Time: 09/14/18  9:00 AM  Result Value Ref Range Status   C Diff antigen POSITIVE (A) NEGATIVE Final   C Diff toxin NEGATIVE NEGATIVE Final   C Diff interpretation Results are indeterminate. See PCR results.  Final    Comment: Performed at [facility], [address], [place], [place] [address]     Scheduled Meds: . amLODipine  10 mg Oral Daily  . aspirin EC  81 mg Oral Daily  . atorvastatin  40 mg Oral q1800  . cilostazol  100 mg Oral BID  . clopidogrel  75 mg Oral Daily  . enoxaparin (LOVENOX) injection  40 mg Subcutaneous  Q24H  . gabapentin  300 mg Oral TID  . insulin aspart  0-15 Units Subcutaneous TID WC  . insulin aspart  0-5 Units Subcutaneous QHS  . insulin glargine  25 Units Subcutaneous QHS  . metFORMIN  1,000 mg Oral BID WC  . metoprolol tartrate  25 mg Oral BID   Continuous Infusions: . sodium chloride 125 mL/hr at 09/14/18 1007    Procedures/Studies: Dg Chest 2 View  Result Date: 09/13/2018 CLINICAL DATA:  Weakness, chest pain and congestion. EXAM: CHEST - 2 VIEW COMPARISON:  12/12/2017 and older exams. FINDINGS: Stable changes from prior CABG surgery. The cardiac silhouette is normal in size and configuration. No mediastinal or hilar masses or evidence of adenopathy. Clear lungs.  No pleural effusion or pneumothorax. Skeletal structures are intact. IMPRESSION: No active cardiopulmonary disease. Electronically Signed   By: Lajean Manes M.D.   On: 09/13/2018 17:11   Ct Head Wo Contrast  Result Date: 09/13/2018 CLINICAL DATA:  Weakness since yesterday.  Confusion. EXAM: CT HEAD WITHOUT CONTRAST TECHNIQUE: Contiguous axial images were obtained from the base of the skull through the vertex without intravenous contrast. COMPARISON:  Brain MRI, 03/29/2018.  Head CT, 08/17/2017. FINDINGS: Brain: No evidence of acute infarction, hemorrhage, hydrocephalus, extra-axial collection or mass lesion/mass effect. There is ventricular and sulcal enlargement reflecting mild diffuse atrophy. 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 Vascular: No hyperdense vessel or unexpected calcification. Skull: Normal. Negative for fracture or focal lesion. Sinuses/Orbits: Globes and orbits are unremarkable. The visualized sinuses and mastoid air cells are clear. Other: None. IMPRESSION: 1. No acute intracranial abnormalities. 2. Mild diffuse atrophy. Electronically Signed   By: Lajean Manes M.D.   On: 09/13/2018 16:55    Orson Eva, DO  [facility] Pager [phone]  If 7PM-7AM,  please contact night-coverage [email or web address] Password [email or web address] 09/14/2018, 12:31 PM   LOS: 1 day

## 2018-09-15 DIAGNOSIS — A414 Sepsis due to anaerobes: Secondary | ICD-10-CM

## 2018-09-15 DIAGNOSIS — A4159 Other Gram-negative sepsis: Secondary | ICD-10-CM

## 2018-09-15 LAB — CBC
HCT: 42.2 % (ref 39.0–52.0)
Hemoglobin: 13.8 g/dL (ref 13.0–17.0)
MCH: 29.9 pg (ref 26.0–34.0)
MCHC: 32.7 g/dL (ref 30.0–36.0)
MCV: 91.3 fL (ref 80.0–100.0)
Platelets: 281 10*3/uL (ref 150–400)
RBC: 4.62 MIL/uL (ref 4.22–5.81)
RDW: 15.5 % (ref 11.5–15.5)
WBC: 12.8 10*3/uL — ABNORMAL HIGH (ref 4.0–10.5)
nRBC: 0 % (ref 0.0–0.2)

## 2018-09-15 LAB — GLUCOSE, CAPILLARY
Glucose-Capillary: 88 mg/dL (ref 70–99)
Glucose-Capillary: 211 mg/dL — ABNORMAL HIGH (ref 70–99)
Glucose-Capillary: 168 mg/dL — ABNORMAL HIGH (ref 70–99)
Glucose-Capillary: 130 mg/dL — ABNORMAL HIGH (ref 70–99)

## 2018-09-15 LAB — BASIC METABOLIC PANEL
Anion gap: 11 (ref 5–15)
BUN: 17 mg/dL (ref 8–23)
CO2: 21 mmol/L — ABNORMAL LOW (ref 22–32)
Calcium: 7.8 mg/dL — ABNORMAL LOW (ref 8.9–10.3)
Chloride: 101 mmol/L (ref 98–111)
Creatinine, Ser: 0.91 mg/dL (ref 0.61–1.24)
GFR calc Af Amer: >60 mL/min (ref >60)
GFR calc non Af Amer: >60 mL/min (ref >60)
Glucose, Bld: 117 mg/dL — ABNORMAL HIGH (ref 70–99)
Potassium: 2.6 mmol/L — CL (ref 3.5–5.1)
Sodium: 133 mmol/L — ABNORMAL LOW (ref 135–145)

## 2018-09-15 LAB — HEPATIC FUNCTION PANEL
ALT: 11 U/L (ref 0–44)
AST: 17 U/L (ref 15–41)
Albumin: 3.2 g/dL — ABNORMAL LOW (ref 3.5–5.0)
Alkaline Phosphatase: 55 U/L (ref 38–126)
BILIRUBIN INDIRECT: 0.5 mg/dL (ref 0.3–0.9)
Bilirubin, Direct: 0.1 mg/dL (ref 0.0–0.2)
Total Bilirubin: 0.6 mg/dL (ref 0.3–1.2)
Total Protein: 6.4 g/dL — ABNORMAL LOW (ref 6.5–8.1)

## 2018-09-15 LAB — MAGNESIUM: Magnesium: 2.1 mg/dL (ref 1.7–2.4)

## 2018-09-15 LAB — URINE CULTURE: Culture: 80000 — AB

## 2018-09-15 LAB — CLOSTRIDIUM DIFFICILE BY PCR, REFLEXED: Toxigenic C. Difficile by PCR: NEGATIVE

## 2018-09-15 LAB — PHOSPHORUS: Phosphorus: 1.4 mg/dL — ABNORMAL LOW (ref 2.5–4.6)

## 2018-09-15 MED ORDER — MAGNESIUM SULFATE 2 GM/50ML IV SOLN: 2.0000 g | Freq: Once | INTRAVENOUS | Status: DC

## 2018-09-15 MED ORDER — POTASSIUM CHLORIDE CRYS ER 20 MEQ PO TBCR
40.0000 meq | EXTENDED_RELEASE_TABLET | Freq: Four times a day (QID) | ORAL | Status: AC
  Administered 2018-09-15 (×2): 40 meq via ORAL
  Filled 2018-09-15 (×2): qty 2

## 2018-09-15 MED ORDER — POTASSIUM CHLORIDE IN NACL 40-0.9 MEQ/L-% IV SOLN
INTRAVENOUS | Status: AC
  Administered 2018-09-15: 75 mL/h via INTRAVENOUS

## 2018-09-15 MED ORDER — LOPERAMIDE HCL 2 MG PO CAPS
2.0000 mg | ORAL_CAPSULE | Freq: Four times a day (QID) | ORAL | Status: DC | PRN
  Administered 2018-09-15: 2 mg via ORAL
  Filled 2018-09-15: qty 1

## 2018-09-15 MED ORDER — MAGNESIUM SULFATE 2 GM/50ML IV SOLN
2.0000 g | Freq: Once | INTRAVENOUS | Status: AC
  Administered 2018-09-15: 2 g via INTRAVENOUS
  Filled 2018-09-15: qty 50

## 2018-09-15 MED ORDER — K PHOS MONO-SOD PHOS DI & MONO 155-852-130 MG PO TABS
500.0000 mg | ORAL_TABLET | Freq: Two times a day (BID) | ORAL | Status: DC
  Administered 2018-09-15 – 2018-09-16 (×3): 500 mg via ORAL
  Filled 2018-09-15 (×3): qty 2

## 2018-09-15 MED ORDER — POTASSIUM CHLORIDE CRYS ER 20 MEQ PO TBCR: 40.0000 meq | EXTENDED_RELEASE_TABLET | Freq: Once | ORAL | Status: DC

## 2018-09-15 NOTE — Progress Notes (Signed)
PROGRESS NOTE  [name] JOA:[id number] DOB: September 13, 1945 DOA: 09/13/2018 PCP: [name], MD   Brief History:  73 year old male with a history of coronary artery disease status post CABG, hypertension, hyperlipidemia, diabetes mellitus type 2, peripheral vascular disease, CKD stage III, complicated diverticulitis status post colostomy and colostomy takedown presenting with 2-day history of somnolence and confusion.  The patient's wife noted increasing malaise and somnolence on 09/12/2018 when the patient was laying in bed all day.  Later that evening, the patient had increasing confusion.  During this period of time, the patient did not take his medications including his insulin and antihypertensive medications.  On 09/13/2018, the patient's wife took him to the [place] where he receives his primary care.  They directed the patient to go to urgent care.  At the urgent care, the patient was instructed go to the emergency department for further evaluation.  Apparently, the patient had been complaining of dysuria and urinary frequency.  There was decreased oral intake and worsening generalized weakness.  In the emergency department, the patient was noted to be tachycardic with lactic acid 2.4.  WBC was 25.1.  The patient was admitted for sepsis likely secondary to urinary source.  Notably, the patient had recent admission from 12/12/2017 through 01/13/931 for complicated diverticulitis.  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  Subsequently, he had reversal of his colostomy on 03/26/2018 (Dr. Gershon Crane)  Assessment/Plan: Sepsis -Present on admission -Secondary to urinary source -Lactic acid peaked at 2.4 -Continue IV fluids -Continue ceftriaxone -Increase ceftriaxone to 2 g daily -sepsis physiology resolved  UTI--Klebsiella -UA--21-50 WBC -Continue ceftriaxone   Diarrhea -C. difficile assay suggest colonization -Stool pathogen panel -Has been an  intermittent problem since his colostomy reversal 03/26/2018  Acute urinary retention -Likely secondary to cystitis/UTI -Plan for voiding trial in the next 24 to 48 hours  Acute on chronic renal failure--ckd 3 -baseline creatinine 0.9-1.2 -serum creatinine peaked 1.51 -due to sepsis and hypovolemia  Diabetes mellitus type 2, uncontrolled with hyperglycemia -Hemoglobin A1c -NovoLog sliding scale -d/c metformin -continue lantus  Atrial tachycardia -improved after restarting metoprolol  Essential hypertension -restart amlodpine and metoprolol  PVD -continue pletal and aspirin  Hypokalemia -replete -check mag--2.1  Hypophosphatemia -replete    Disposition Plan:   Home 2/3 if stable Family Communication:   Spouse updated at bedside 2/2  Consultants:  none  Code Status:  FULL   DVT Prophylaxis:  [place] Lovenox   Procedures: As Listed in Progress Note Above  Antibiotics: Ceftriaxone 1/31>>>         Subjective: Patient denies fevers, chills, headache, chest pain, dyspnea, nausea, vomiting, diarrhea, abdominal pain, dysuria, hematuria, hematochezia, and melena.   Objective: Vitals:   09/14/18 1607 09/14/18 2147 09/15/18 0610 09/15/18 1337  BP:  (!) 142/70 (!) 152/76 (!) 147/70  Pulse: 81 86 86 92  Resp: 12 18 18 19   Temp: 97.9 F (36.6 C) 97.6 F (36.4 C) 98.3 F (36.8 C) 98.7 F (37.1 C)  TempSrc: Oral Oral Oral   SpO2: 97% 98% 98% 98%  Weight:  63.6 kg    Height:  5\' 8"  (1.727 m)      Intake/Output Summary (Last 24 hours) at 09/15/2018 1557 Last data filed at 09/15/2018 1300 Gross per 24 hour  Intake 1843.41 ml  Output 300 ml  Net 1543.41 ml   Weight change: -18 kg Exam:   General:  Pt is alert, follows commands appropriately, not  in acute distress  HEENT: No icterus, No thrush, No neck mass, [place]/AT  Cardiovascular: RRR, S1/S2, no rubs, no gallops  Respiratory: CTA bilaterally, no wheezing, no crackles, no  rhonchi  Abdomen: Soft/+BS, non tender, non distended, no guarding  Extremities: No edema, No lymphangitis, No petechiae, No rashes, no synovitis   Data Reviewed: I have personally reviewed following labs and imaging studies Basic Metabolic Panel: Recent Labs  Lab 09/13/18 1617 09/13/18 2217 09/14/18 0431 09/15/18 0429  NA 132* 133* 134* 133*  K 3.0* 3.2* 3.3* 2.6*  CL 89* 100 100 101  CO2 23 18* 23 21*  GLUCOSE 204* 157* 159* 117*  BUN 24* 21 21 17   CREATININE 1.51* 1.20 1.16 0.91  CALCIUM 9.0 7.8* 8.0* 7.8*  MG 1.8  --   --  2.1  PHOS  --   --   --  1.4*   Liver Function Tests: Recent Labs  Lab 09/13/18 1617 09/15/18 0429  AST 18 17  ALT 12 11  ALKPHOS 72 55  BILITOT 2.1* 0.6  PROT 8.3* 6.4*  ALBUMIN 4.4 3.2*   No results for input(s): LIPASE, AMYLASE in the last 168 hours. No results for input(s): AMMONIA in the last 168 hours. Coagulation Profile: No results for input(s): INR, PROTIME in the last 168 hours. CBC: Recent Labs  Lab 09/13/18 1617 09/13/18 2033 09/14/18 0431 09/15/18 0429  WBC 25.1* 22.6* 20.1* 12.8*  NEUTROABS 21.8*  --   --   --   HGB 16.2 14.1 14.1 13.8  HCT 50.3 42.8 42.4 42.2  MCV 89.5 89.0 90.8 91.3  PLT 312 262 266 281   Cardiac Enzymes: Recent Labs  Lab 09/13/18 1617  TROPONINI <0.03   BNP: Invalid input(s): POCBNP CBG: Recent Labs  Lab 09/14/18 1601 09/14/18 2152 09/15/18 0724 09/15/18 1119 09/15/18 1546  GLUCAP 93 132* 88 211* 168*   HbA1C: No results for input(s): HGBA1C in the last 72 hours. Urine analysis:    Component Value Date/Time   COLORURINE YELLOW 09/13/2018 1556   APPEARANCEUR HAZY (A) 09/13/2018 1556   LABSPEC 1.023 09/13/2018 1556   PHURINE 5.0 09/13/2018 1556   GLUCOSEU >=500 (A) 09/13/2018 1556   HGBUR MODERATE (A) 09/13/2018 1556   BILIRUBINUR NEGATIVE 09/13/2018 1556   KETONESUR 80 (A) 09/13/2018 1556   PROTEINUR 100 (A) 09/13/2018 1556   UROBILINOGEN 0.2 03/16/2015 0120   NITRITE  NEGATIVE 09/13/2018 1556   LEUKOCYTESUR TRACE (A) 09/13/2018 1556   Sepsis Labs: @LABRCNTIP (procalcitonin:4,lacticidven:4) ) Recent Results (from the past 240 hour(s))  Urine culture     Status: Abnormal   Collection Time: 09/13/18  3:56 PM  Result Value Ref Range Status   Specimen Description   Final    URINE, CLEAN CATCH Performed at [facility], [address], [place], [place] [address]    Special Requests   Final    NONE Performed at [facility], [address], [place], [place] [address]    Culture 80,000 COLONIES/mL KLEBSIELLA PNEUMONIAE (A)  Final   Report Status 09/15/2018 FINAL  Final   Organism ID, Bacteria KLEBSIELLA PNEUMONIAE (A)  Final      Susceptibility   Klebsiella pneumoniae - MIC*    AMPICILLIN >=32 RESISTANT Resistant     CEFAZOLIN <=4 SENSITIVE Sensitive     CEFTRIAXONE <=1 SENSITIVE Sensitive     CIPROFLOXACIN <=0.25 SENSITIVE Sensitive     GENTAMICIN <=1 SENSITIVE Sensitive     IMIPENEM <=0.25 SENSITIVE Sensitive     NITROFURANTOIN 64 INTERMEDIATE Intermediate  TRIMETH/SULFA <=20 SENSITIVE Sensitive     AMPICILLIN/SULBACTAM 4 SENSITIVE Sensitive     PIP/TAZO <=4 SENSITIVE Sensitive     Extended ESBL NEGATIVE Sensitive     * 80,000 COLONIES/mL KLEBSIELLA PNEUMONIAE  Culture, blood (routine x 2)     Status: None (Preliminary result)   Collection Time: 09/13/18  6:20 PM  Result Value Ref Range Status   Specimen Description BLOOD LEFT ARM  Final   Special Requests   Final    BOTTLES DRAWN AEROBIC AND ANAEROBIC Blood Culture adequate volume   Culture   Final    NO GROWTH 2 DAYS Performed at [facility], [address] [place], [place] [address]    Report Status PENDING  Incomplete  Culture, blood (routine x 2)     Status: None (Preliminary result)   Collection Time: 09/13/18  6:26 PM  Result Value Ref Range Status   Specimen Description BLOOD RIGHT ARM  Final   Special Requests   Final    BOTTLES DRAWN AEROBIC AND ANAEROBIC Blood Culture  adequate volume   Culture   Final    NO GROWTH 2 DAYS Performed at [facility], [address], [place], [place] [address]    Report Status PENDING  Incomplete  MRSA PCR Screening     Status: None   Collection Time: 09/14/18  1:14 AM  Result Value Ref Range Status   MRSA by PCR NEGATIVE NEGATIVE Final    Comment:        The GeneXpert MRSA Assay (FDA approved for NASAL specimens only), is one component of a comprehensive MRSA colonization surveillance program. It is not intended to diagnose MRSA infection nor to guide or monitor treatment for MRSA infections. Performed at [facility], [address] [place], [place] [address]   C difficile quick scan w PCR reflex     Status: Abnormal   Collection Time: 09/14/18  9:00 AM  Result Value Ref Range Status   C Diff antigen POSITIVE (A) NEGATIVE Final   C Diff toxin NEGATIVE NEGATIVE Final   C Diff interpretation Results are indeterminate. See PCR results.  Final    Comment: Performed at [facility], [address] [place], [place] [address]  Gastrointestinal Panel by PCR , Stool     Status: None   Collection Time: 09/14/18  9:00 AM  Result Value Ref Range Status   Campylobacter species NOT DETECTED NOT DETECTED Final   Plesimonas shigelloides NOT DETECTED NOT DETECTED Final   Salmonella species NOT DETECTED NOT DETECTED Final   Yersinia enterocolitica NOT DETECTED NOT DETECTED Final   Vibrio species NOT DETECTED NOT DETECTED Final   Vibrio cholerae NOT DETECTED NOT DETECTED Final   Enteroaggregative E coli (EAEC) NOT DETECTED NOT DETECTED Final   Enteropathogenic E coli (EPEC) NOT DETECTED NOT DETECTED Final   Enterotoxigenic E coli (ETEC) NOT DETECTED NOT DETECTED Final   Shiga like toxin producing E coli (STEC) NOT DETECTED NOT DETECTED Final   Shigella/Enteroinvasive E coli (EIEC) NOT DETECTED NOT DETECTED Final   Cryptosporidium NOT DETECTED NOT DETECTED Final   Cyclospora cayetanensis NOT DETECTED NOT DETECTED Final    Entamoeba histolytica NOT DETECTED NOT DETECTED Final   Giardia lamblia NOT DETECTED NOT DETECTED Final   Adenovirus F40/41 NOT DETECTED NOT DETECTED Final   Astrovirus NOT DETECTED NOT DETECTED Final   Norovirus GI/GII NOT DETECTED NOT DETECTED Final   Rotavirus A NOT DETECTED NOT DETECTED Final   Sapovirus (I, II, IV, and V) NOT DETECTED NOT  DETECTED Final    Comment: Performed at [facility], [address] [place], [place] [address]  C. Diff by PCR, Reflexed     Status: None   Collection Time: 09/14/18  9:00 AM  Result Value Ref Range Status   Toxigenic C. Difficile by PCR NEGATIVE NEGATIVE Final    Comment: Patient is colonized with non toxigenic C. difficile. May not need treatment unless significant symptoms are present. Performed at [facility], [place] [address], [place], [place] [address]      Scheduled Meds: . amLODipine  10 mg Oral Daily  . aspirin EC  81 mg Oral Daily  . atorvastatin  40 mg Oral q1800  . cilostazol  100 mg Oral BID  . clopidogrel  75 mg Oral Daily  . enoxaparin (LOVENOX) injection  40 mg Subcutaneous Q24H  . gabapentin  300 mg Oral TID  . insulin aspart  0-15 Units Subcutaneous TID WC  . insulin aspart  0-5 Units Subcutaneous QHS  . insulin glargine  25 Units Subcutaneous QHS  . metoprolol tartrate  25 mg Oral BID  . phosphorus  500 mg Oral BID   Continuous Infusions: . 0.9 % NaCl with KCl 40 mEq / L 75 mL/hr (09/15/18 0719)  . cefTRIAXone (ROCEPHIN)  IV Stopped (09/14/18 1745)    Procedures/Studies: Dg Chest 2 View  Result Date: 09/13/2018 CLINICAL DATA:  Weakness, chest pain and congestion. EXAM: CHEST - 2 VIEW COMPARISON:  12/12/2017 and older exams. FINDINGS: Stable changes from prior CABG surgery. The cardiac silhouette is normal in size and configuration. No mediastinal or hilar masses or evidence of adenopathy. Clear lungs.  No pleural effusion or pneumothorax. Skeletal structures are intact. IMPRESSION: No active  cardiopulmonary disease. Electronically Signed   By: Lajean Manes M.D.   On: 09/13/2018 17:11   Ct Head Wo Contrast  Result Date: 09/13/2018 CLINICAL DATA:  Weakness since yesterday.  Confusion. EXAM: CT HEAD WITHOUT CONTRAST TECHNIQUE: Contiguous axial images were obtained from the base of the skull through the vertex without intravenous contrast. COMPARISON:  Brain MRI, 03/29/2018.  Head CT, 08/17/2017. FINDINGS: Brain: No evidence of acute infarction, hemorrhage, hydrocephalus, extra-axial collection or mass lesion/mass effect. There is ventricular and sulcal enlargement reflecting mild diffuse atrophy. 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 Vascular: No hyperdense vessel or unexpected calcification. Skull: Normal. Negative for fracture or focal lesion. Sinuses/Orbits: Globes and orbits are unremarkable. The visualized sinuses and mastoid air cells are clear. Other: None. IMPRESSION: 1. No acute intracranial abnormalities. 2. Mild diffuse atrophy. Electronically Signed   By: Lajean Manes M.D.   On: 09/13/2018 16:55    Orson Eva, DO  [facility] Pager [phone]  If 7PM-7AM, please contact night-coverage [email or web address] Password [email or web address] 09/15/2018, 3:57 PM   LOS: 2 days

## 2018-09-15 NOTE — Progress Notes (Addendum)
CRITICAL VALUE ALERT  Critical Value:  Potassium 2.6  Date & Time Notied:  09/15/18 @ 4650  Provider Notified: Dr. Olevia Bowens   Orders Received/Actions taken: Orders entered by Dr. Olevia Bowens at [phone].

## 2018-09-15 NOTE — Progress Notes (Signed)
C Diff results: Toxin negtive, anitgen positive, PCR negative.  Enteric precautions discontinued.

## 2018-09-15 NOTE — Progress Notes (Signed)
Urinary catheter d/c'd at 1600.  Pt voided 135mls at 1820.

## 2018-09-15 NOTE — Discharge Summary (Signed)
Physician Discharge Summary  [name] KXF:[id number] DOB: [birth year] DOA: 09/13/2018  PCP: [name], MD  Admit date: 09/13/2018 Discharge date: 09/16/2018  Admitted From:  Home Disposition:  Home   Recommendations for Outpatient Follow-up:  1. Follow up with PCP in 1-2 weeks 2. Please obtain BMP/CBC in one week     Discharge Condition: Stable CODE STATUS: FULL Diet recommendation: Heart Healthy / Carb Modified    Brief/Interim Summary: 73 year old male with a history of coronary artery disease status post CABG, hypertension, hyperlipidemia, diabetes mellitus type 2, peripheral vascular disease, CKD stage III, complicated diverticulitis status post colostomy and colostomy takedown presenting with 2-day history of somnolence and confusion. The patient's wife noted increasing malaise and somnolence on 09/12/2018 when the patient was laying in bed all day. Later that evening, the patient had increasing confusion. During this period of time, the patient did not take his medications including his insulin and antihypertensive medications. On 09/13/2018, the patient's wife took him to the [place] where he receives his primary care. They directed the patient to go to urgent care. At the urgent care, the patient was instructed go to the emergency department for further evaluation. Apparently, the patient had been complaining of dysuria and urinary frequency. There was decreased oral intake and worsening generalized weakness. In the emergency department, the patient was noted to be tachycardic with lactic acid 2.4. WBC was 25.1. The patient was admitted for sepsis secondary to urinary source.  He improved clinically on IV ceftriaxone.  He will d/c home with cefidinir x 4 more days.  Notably, the patient had recent admission from 12/12/2017 through 6/96/7893 for complicated diverticulitis. 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 Subsequently, he had  reversal of his colostomy on 03/26/2018 (Dr. Gershon Crane)  Discharge Diagnoses:  Sepsis -Present on admission -Secondary to urinary source -Lactic acid peaked at 2.4 -Continue IV fluids -Increased ceftriaxone to 2 g daily -sepsis physiology resolved  UTI--Klebsiella -UA--21-50 WBC -Continue ceftriaxone  -d/c home with 4 more days of cefdinir to complete one week of tx  Diarrhea -C. difficile assay suggest colonization -Stool pathogen panel--neg -Has been an intermittent problem since his colostomy reversal 03/26/2018 -prn loperamide  Acute urinary retention -Likely secondary to cystitis/UTI -foley placed in ED -foley removed 09/15/18-->pt then able to urinate spontaneously  Acute on chronic renal failure--ckd 3 -baseline creatinine 0.9-1.2 -serum creatinine peaked 1.51 -due to sepsis and hypovolemia -serum creatinine 0.93 on day of d/c  Diabetes mellitus type 2, uncontrolled with hyperglycemia -Hemoglobin A1c--7.5 -NovoLog sliding scale -d/c metformin--resume after d/c -continue lantus  Atrial tachycardia -improved after restarting metoprolol  Essential hypertension -restart amlodpine and metoprolol  PVD -continue pletal and aspirin  Hypokalemia -replete -check mag--2.1  Hypophosphatemia -replete   Discharge Instructions   Allergies as of 09/16/2018   No Known Allergies     Medication List    STOP taking these medications   simvastatin 80 MG tablet Commonly known as:  ZOCOR Replaced by:  atorvastatin 40 MG tablet     TAKE these medications   albuterol 108 (90 Base) MCG/ACT inhaler Commonly known as:  PROVENTIL HFA;VENTOLIN HFA Inhale 2 puffs into the lungs every 6 (six) hours as needed for wheezing or shortness of breath.   amLODipine 10 MG tablet Commonly known as:  NORVASC Take 1 tablet (10 mg total) by mouth daily.   aspirin 81 MG tablet Take 81 mg by mouth daily.   atorvastatin 40 MG tablet Commonly known as:  LIPITOR Take 1  tablet (40 mg total) by mouth daily at 6 PM. Replaces:  simvastatin 80 MG tablet   cefdinir 300 MG capsule Commonly known as:  OMNICEF Take 1 capsule (300 mg total) by mouth 2 (two) times daily.   cilostazol 100 MG tablet Commonly known as:  PLETAL Take 100 mg by mouth 2 (two) times daily.   clopidogrel 75 MG tablet Commonly known as:  PLAVIX Take 75 mg by mouth daily.   ferrous sulfate 325 (65 FE) MG tablet Take 325 mg by mouth 2 (two) times daily with a meal.   gabapentin 300 MG capsule Commonly known as:  NEURONTIN Take 300 mg by mouth 3 (three) times daily.   insulin glargine 100 UNIT/ML injection Commonly known as:  LANTUS Inject 0.08 mLs (8 Units total) into the skin at bedtime. What changed:  how much to take   metFORMIN 500 MG tablet Commonly known as:  GLUCOPHAGE Take 1,000 mg by mouth 2 (two) times daily with a meal.   metoprolol tartrate 25 MG tablet Commonly known as:  LOPRESSOR Take 25 mg by mouth 2 (two) times daily.   pantoprazole 40 MG tablet Commonly known as:  PROTONIX Take 1 tablet (40 mg total) by mouth daily.   phosphorus 155-852-130 MG tablet Commonly known as:  K PHOS NEUTRAL Take 2 tablets (500 mg total) by mouth 2 (two) times daily.   vitamin B-12 1000 MCG tablet Commonly known as:  CYANOCOBALAMIN Take 1,000 mcg by mouth daily.   Vitamin D 50 MCG (2000 UT) tablet Take 2,000 Units by mouth daily.       No Known Allergies  Consultations:  none   Procedures/Studies: Dg Chest 2 View  Result Date: 09/13/2018 CLINICAL DATA:  Weakness, chest pain and congestion. EXAM: CHEST - 2 VIEW COMPARISON:  12/12/2017 and older exams. FINDINGS: Stable changes from prior CABG surgery. The cardiac silhouette is normal in size and configuration. No mediastinal or hilar masses or evidence of adenopathy. Clear lungs.  No pleural effusion or pneumothorax. Skeletal structures are intact. IMPRESSION: No active cardiopulmonary disease. Electronically Signed    By: Lajean Manes M.D.   On: 09/13/2018 17:11   Ct Head Wo Contrast  Result Date: 09/13/2018 CLINICAL DATA:  Weakness since yesterday.  Confusion. EXAM: CT HEAD WITHOUT CONTRAST TECHNIQUE: Contiguous axial images were obtained from the base of the skull through the vertex without intravenous contrast. COMPARISON:  Brain MRI, 03/29/2018.  Head CT, 08/17/2017. FINDINGS: Brain: No evidence of acute infarction, hemorrhage, hydrocephalus, extra-axial collection or mass lesion/mass effect. There is ventricular and sulcal enlargement reflecting mild diffuse atrophy. 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 Vascular: No hyperdense vessel or unexpected calcification. Skull: Normal. Negative for fracture or focal lesion. Sinuses/Orbits: Globes and orbits are unremarkable. The visualized sinuses and mastoid air cells are clear. Other: None. IMPRESSION: 1. No acute intracranial abnormalities. 2. Mild diffuse atrophy. Electronically Signed   By: Lajean Manes M.D.   On: 09/13/2018 16:55        Discharge Exam: Vitals:   09/16/18 0529 09/16/18 0838  BP: (!) 161/73 (!) 151/79  Pulse: 86 93  Resp: 17   Temp: 98.1 F (36.7 C)   SpO2: 97%    Vitals:   09/15/18 1337 09/15/18 2149 09/16/18 0529 09/16/18 0838  BP: (!) 147/70 (!) 151/61 (!) 161/73 (!) 151/79  Pulse: 92 79 86 93  Resp: 19 20 17    Temp: 98.7 F (37.1 C) 98.6 F (37 C) 98.1 F (  36.7 C)   TempSrc:  Oral Oral   SpO2: 98% 96% 97%   Weight:      Height:        General: Pt is alert, awake, not in acute distress Cardiovascular: RRR, S1/S2 +, no rubs, no gallops Respiratory: CTA bilaterally, no wheezing, no rhonchi Abdominal: Soft, NT, ND, bowel sounds + Extremities: no edema, no cyanosis   The results of significant diagnostics from this hospitalization (including imaging, microbiology, ancillary and laboratory) are listed below for reference.    Significant Diagnostic Studies: Dg Chest 2  View  Result Date: 09/13/2018 CLINICAL DATA:  Weakness, chest pain and congestion. EXAM: CHEST - 2 VIEW COMPARISON:  12/12/2017 and older exams. FINDINGS: Stable changes from prior CABG surgery. The cardiac silhouette is normal in size and configuration. No mediastinal or hilar masses or evidence of adenopathy. Clear lungs.  No pleural effusion or pneumothorax. Skeletal structures are intact. IMPRESSION: No active cardiopulmonary disease. Electronically Signed   By: Lajean Manes M.D.   On: 09/13/2018 17:11   Ct Head Wo Contrast  Result Date: 09/13/2018 CLINICAL DATA:  Weakness since yesterday.  Confusion. EXAM: CT HEAD WITHOUT CONTRAST TECHNIQUE: Contiguous axial images were obtained from the base of the skull through the vertex without intravenous contrast. COMPARISON:  Brain MRI, 03/29/2018.  Head CT, 08/17/2017. FINDINGS: Brain: No evidence of acute infarction, hemorrhage, hydrocephalus, extra-axial collection or mass lesion/mass effect. There is ventricular and sulcal enlargement reflecting mild diffuse atrophy. 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 Vascular: No hyperdense vessel or unexpected calcification. Skull: Normal. Negative for fracture or focal lesion. Sinuses/Orbits: Globes and orbits are unremarkable. The visualized sinuses and mastoid air cells are clear. Other: None. IMPRESSION: 1. No acute intracranial abnormalities. 2. Mild diffuse atrophy. Electronically Signed   By: Lajean Manes M.D.   On: 09/13/2018 16:55     Microbiology: Recent Results (from the past 240 hour(s))  Urine culture     Status: Abnormal   Collection Time: 09/13/18  3:56 PM  Result Value Ref Range Status   Specimen Description   Final    URINE, CLEAN CATCH Performed at [facility], [address], [place], [place] [address]    Special Requests   Final    NONE Performed at [facility], [address], [place], [place] [address]    Culture 80,000 COLONIES/mL  KLEBSIELLA PNEUMONIAE (A)  Final   Report Status 09/15/2018 FINAL  Final   Organism ID, Bacteria KLEBSIELLA PNEUMONIAE (A)  Final      Susceptibility   Klebsiella pneumoniae - MIC*    AMPICILLIN >=32 RESISTANT Resistant     CEFAZOLIN <=4 SENSITIVE Sensitive     CEFTRIAXONE <=1 SENSITIVE Sensitive     CIPROFLOXACIN <=0.25 SENSITIVE Sensitive     GENTAMICIN <=1 SENSITIVE Sensitive     IMIPENEM <=0.25 SENSITIVE Sensitive     NITROFURANTOIN 64 INTERMEDIATE Intermediate     TRIMETH/SULFA <=20 SENSITIVE Sensitive     AMPICILLIN/SULBACTAM 4 SENSITIVE Sensitive     PIP/TAZO <=4 SENSITIVE Sensitive     Extended ESBL NEGATIVE Sensitive     * 80,000 COLONIES/mL KLEBSIELLA PNEUMONIAE  Culture, blood (routine x 2)     Status: None (Preliminary result)   Collection Time: 09/13/18  6:20 PM  Result Value Ref Range Status   Specimen Description BLOOD LEFT ARM  Final   Special Requests   Final    BOTTLES DRAWN AEROBIC AND ANAEROBIC Blood Culture adequate volume   Culture  Final    NO GROWTH 3 DAYS Performed at [facility], [address] [place], [place] [address]    Report Status PENDING  Incomplete  Culture, blood (routine x 2)     Status: None (Preliminary result)   Collection Time: 09/13/18  6:26 PM  Result Value Ref Range Status   Specimen Description BLOOD RIGHT ARM  Final   Special Requests   Final    BOTTLES DRAWN AEROBIC AND ANAEROBIC Blood Culture adequate volume   Culture   Final    NO GROWTH 3 DAYS Performed at [facility], [address] [place], [place] [address]    Report Status PENDING  Incomplete  MRSA PCR Screening     Status: None   Collection Time: 09/14/18  1:14 AM  Result Value Ref Range Status   MRSA by PCR NEGATIVE NEGATIVE Final    Comment:        The GeneXpert MRSA Assay (FDA approved for NASAL specimens only), is one component of a comprehensive MRSA colonization surveillance program. It is not intended to diagnose MRSA infection nor to guide or monitor  treatment for MRSA infections. Performed at [facility], [address], [place], [place] [address]   C difficile quick scan w PCR reflex     Status: Abnormal   Collection Time: 09/14/18  9:00 AM  Result Value Ref Range Status   C Diff antigen POSITIVE (A) NEGATIVE Final   C Diff toxin NEGATIVE NEGATIVE Final   C Diff interpretation Results are indeterminate. See PCR results.  Final    Comment: Performed at [facility], [address] [place], [place] [address]  Gastrointestinal Panel by PCR , Stool     Status: None   Collection Time: 09/14/18  9:00 AM  Result Value Ref Range Status   Campylobacter species NOT DETECTED NOT DETECTED Final   Plesimonas shigelloides NOT DETECTED NOT DETECTED Final   Salmonella species NOT DETECTED NOT DETECTED Final   Yersinia enterocolitica NOT DETECTED NOT DETECTED Final   Vibrio species NOT DETECTED NOT DETECTED Final   Vibrio cholerae NOT DETECTED NOT DETECTED Final   Enteroaggregative E coli (EAEC) NOT DETECTED NOT DETECTED Final   Enteropathogenic E coli (EPEC) NOT DETECTED NOT DETECTED Final   Enterotoxigenic E coli (ETEC) NOT DETECTED NOT DETECTED Final   Shiga like toxin producing E coli (STEC) NOT DETECTED NOT DETECTED Final   Shigella/Enteroinvasive E coli (EIEC) NOT DETECTED NOT DETECTED Final   Cryptosporidium NOT DETECTED NOT DETECTED Final   Cyclospora cayetanensis NOT DETECTED NOT DETECTED Final   Entamoeba histolytica NOT DETECTED NOT DETECTED Final   Giardia lamblia NOT DETECTED NOT DETECTED Final   Adenovirus F40/41 NOT DETECTED NOT DETECTED Final   Astrovirus NOT DETECTED NOT DETECTED Final   Norovirus GI/GII NOT DETECTED NOT DETECTED Final   Rotavirus A NOT DETECTED NOT DETECTED Final   Sapovirus (I, II, IV, and V) NOT DETECTED NOT DETECTED Final    Comment: Performed at [facility], [address] [place], [place] [address]  C. Diff by PCR, Reflexed     Status: None   Collection Time: 09/14/18  9:00 AM  Result  Value Ref Range Status   Toxigenic C. Difficile by PCR NEGATIVE NEGATIVE Final    Comment: Patient is colonized with non toxigenic C. difficile. May not need treatment unless significant symptoms are present. Performed at [facility], [place] [address] [place], [place] [address]      Labs: Basic Metabolic Panel: Recent  Labs  Lab 09/13/18 1617 09/13/18 2217 09/14/18 0431 09/15/18 0429 09/16/18 0439  NA 132* 133* 134* 133* 138  K 3.0* 3.2* 3.3* 2.6* 3.2*  CL 89* 100 100 101 108  CO2 23 18* 23 21* 23  GLUCOSE 204* 157* 159* 117* 137*  BUN 24* 21 21 17 13   CREATININE 1.51* 1.20 1.16 0.91 0.93  CALCIUM 9.0 7.8* 8.0* 7.8* 7.9*  MG 1.8  --   --  2.1  --   PHOS  --   --   --  1.4*  --    Liver Function Tests: Recent Labs  Lab 09/13/18 1617 09/15/18 0429  AST 18 17  ALT 12 11  ALKPHOS 72 55  BILITOT 2.1* 0.6  PROT 8.3* 6.4*  ALBUMIN 4.4 3.2*   No results for input(s): LIPASE, AMYLASE in the last 168 hours. No results for input(s): AMMONIA in the last 168 hours. CBC: Recent Labs  Lab 09/13/18 1617 09/13/18 2033 09/14/18 0431 09/15/18 0429 09/16/18 0439  WBC 25.1* 22.6* 20.1* 12.8* 6.1  NEUTROABS 21.8*  --   --   --   --   HGB 16.2 14.1 14.1 13.8 12.7*  HCT 50.3 42.8 42.4 42.2 39.0  MCV 89.5 89.0 90.8 91.3 90.3  PLT 312 262 266 281 275   Cardiac Enzymes: Recent Labs  Lab 09/13/18 1617  TROPONINI <0.03   BNP: Invalid input(s): POCBNP CBG: Recent Labs  Lab 09/15/18 1119 09/15/18 1546 09/15/18 2223 09/16/18 0754 09/16/18 1107  GLUCAP 211* 168* 130* 121* 147*    Time coordinating discharge:  36 minutes  Signed:  Orson Eva, DO [facility] Pager: [phone] 09/16/2018, 1:08 PM

## 2018-09-16 LAB — BASIC METABOLIC PANEL
Anion gap: 7 (ref 5–15)
BUN: 13 mg/dL (ref 8–23)
CALCIUM: 7.9 mg/dL — AB (ref 8.9–10.3)
CO2: 23 mmol/L (ref 22–32)
Chloride: 108 mmol/L (ref 98–111)
Creatinine, Ser: 0.93 mg/dL (ref 0.61–1.24)
GFR calc non Af Amer: >60 mL/min (ref >60)
Glucose, Bld: 137 mg/dL — ABNORMAL HIGH (ref 70–99)
Potassium: 3.2 mmol/L — ABNORMAL LOW (ref 3.5–5.1)
Sodium: 138 mmol/L (ref 135–145)

## 2018-09-16 LAB — CBC
HCT: 39 % (ref 39.0–52.0)
Hemoglobin: 12.7 g/dL — ABNORMAL LOW (ref 13.0–17.0)
MCH: 29.4 pg (ref 26.0–34.0)
MCHC: 32.6 g/dL (ref 30.0–36.0)
MCV: 90.3 fL (ref 80.0–100.0)
PLATELETS: 275 10*3/uL (ref 150–400)
RBC: 4.32 MIL/uL (ref 4.22–5.81)
RDW: 15.3 % (ref 11.5–15.5)
WBC: 6.1 10*3/uL (ref 4.0–10.5)
nRBC: 0 % (ref 0.0–0.2)

## 2018-09-16 LAB — GLUCOSE, CAPILLARY
Glucose-Capillary: 121 mg/dL — ABNORMAL HIGH (ref 70–99)
Glucose-Capillary: 147 mg/dL — ABNORMAL HIGH (ref 70–99)

## 2018-09-16 LAB — HEMOGLOBIN A1C
Hgb A1c MFr Bld: 7.5 % — ABNORMAL HIGH (ref 4.8–5.6)
Mean Plasma Glucose: 168.55 mg/dL

## 2018-09-16 MED ORDER — CEFDINIR 300 MG PO CAPS: 300.0000 mg | ORAL_CAPSULE | Freq: Two times a day (BID) | ORAL | 0 refills | Status: DC

## 2018-09-16 MED ORDER — K PHOS MONO-SOD PHOS DI & MONO 155-852-130 MG PO TABS: 500.0000 mg | ORAL_TABLET | Freq: Two times a day (BID) | ORAL | 0 refills | Status: DC

## 2018-09-16 MED ORDER — ATORVASTATIN CALCIUM 40 MG PO TABS: 40.0000 mg | ORAL_TABLET | Freq: Every day | ORAL | 1 refills | Status: DC

## 2018-09-16 NOTE — Progress Notes (Signed)
Patient's IV catheter removed and intact. Pt's IV site clean dry and intact. Discharge instructions including medications and follow up appointments were reviewed and discussed with patient. All questions were answered and no further questions at this time.. Pt in stable condition and in no acute distress at time of discharge. Pt will be escorted by nurse tech.  

## 2018-09-16 NOTE — Care Management Important Message (Signed)
Important Message  Patient Details  Name: [name] MRN: [id number] Date of Birth: September 01, 1945   Medicare Important Message Given:  Yes    [name] 09/16/2018, 11:23 AM

## 2018-09-18 LAB — CULTURE, BLOOD (ROUTINE X 2)
CULTURE: NO GROWTH
Culture: NO GROWTH
Special Requests: ADEQUATE
Special Requests: ADEQUATE

## 2018-10-15 ENCOUNTER — Other Ambulatory Visit: Payer: Self-pay | Admitting: Thoracic Surgery (Cardiothoracic Vascular Surgery)

## 2018-10-15 DIAGNOSIS — R911 Solitary pulmonary nodule: Secondary | ICD-10-CM

## 2018-10-19 DIAGNOSIS — Z0100 Encounter for examination of eyes and vision without abnormal findings: Secondary | ICD-10-CM | POA: Diagnosis not present

## 2018-10-19 DIAGNOSIS — E119 Type 2 diabetes mellitus without complications: Secondary | ICD-10-CM | POA: Diagnosis not present

## 2018-11-27 ENCOUNTER — Other Ambulatory Visit: Payer: Medicare Other

## 2018-12-03 ENCOUNTER — Ambulatory Visit: Payer: Medicare Other | Admitting: Thoracic Surgery (Cardiothoracic Vascular Surgery)

## 2019-01-09 ENCOUNTER — Other Ambulatory Visit: Payer: Self-pay

## 2019-01-09 ENCOUNTER — Ambulatory Visit
Admission: RE | Admit: 2019-01-09 | Discharge: 2019-01-09 | Disposition: A | Discharge status: Home / Self Care | Payer: Medicare Other | Source: Ambulatory Visit | Attending: Thoracic Surgery (Cardiothoracic Vascular Surgery) | Admitting: Thoracic Surgery (Cardiothoracic Vascular Surgery)

## 2019-01-09 DIAGNOSIS — R911 Solitary pulmonary nodule: Secondary | ICD-10-CM

## 2019-01-09 DIAGNOSIS — R918 Other nonspecific abnormal finding of lung field: Secondary | ICD-10-CM | POA: Diagnosis not present

## 2019-01-14 ENCOUNTER — Encounter: Payer: Self-pay | Admitting: Thoracic Surgery (Cardiothoracic Vascular Surgery)

## 2019-01-14 ENCOUNTER — Other Ambulatory Visit: Payer: Self-pay

## 2019-01-14 ENCOUNTER — Ambulatory Visit: Payer: Medicare Other | Admitting: Thoracic Surgery (Cardiothoracic Vascular Surgery)

## 2019-01-14 ENCOUNTER — Other Ambulatory Visit: Payer: Medicare Other

## 2019-01-14 VITALS — BP 153/74 | HR 93 | Temp 97.8°F | Resp 20 | Ht 67.0 in | Wt 143.0 lb

## 2019-01-14 DIAGNOSIS — Z951 Presence of aortocoronary bypass graft: Secondary | ICD-10-CM | POA: Diagnosis not present

## 2019-01-14 DIAGNOSIS — R911 Solitary pulmonary nodule: Secondary | ICD-10-CM

## 2019-01-14 NOTE — Progress Notes (Signed)
[address]       [place] [address]             [phone]     HPI: [name] returns for a scheduled follow-up visit  [name] is a 73 year old man with history of tobacco abuse (quit in 2002), chronic kidney disease, and atherosclerotic cardiovascular disease.  I did coronary bypass grafting x5 in March 2017.  On the preoperative chest x-ray and CT he had a cavitary lesion in the right upper lobe.  A follow-up PET/CT showed interval resolution.  We have been following him since that time with CT scans.  I saw him in April 2019.  There were no solid nodules but there were multiple groundglass opacities throughout, but more concentrated in the right upper lobe.  These were likely postinflammatory but we felt additional follow-up was warranted.  Back in February he had a urinary tract infection and actually was hospitalized briefly.  He otherwise has been feeling well.  He has not had any other falls.  He denies any cough or shortness of breath. Past Medical History:  Diagnosis Date  . CAD S/P percutaneous coronary angioplasty 1998   PCI TO CX  . CKD (chronic kidney disease) stage 3, GFR 30-59 ml/min (HCC)   . Diabetes mellitus without complication ([place])   . Diverticulitis 08/16/2017   hospitalized with diverticulitis/sepsis  . GERD (gastroesophageal reflux disease)    occassionally  . 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 Hypercholesteremia   . Hypertension   . Neuropathy   . Peripheral vascular disease ([place])    s/p R-L FEM-FEM BYPASS  . Pneumonia   . Tachycardia    after CABG, pt on medicine for this   Current Outpatient Medications  Medication Sig Dispense Refill  . albuterol (PROVENTIL HFA;VENTOLIN HFA) 108 (90 Base) MCG/ACT inhaler Inhale 2 puffs into the lungs every 6 (six) hours as needed for wheezing or shortness of breath. 1 Inhaler 2  . amLODipine (NORVASC) 10 MG tablet Take 1 tablet (10 mg total) by mouth daily. 30 tablet 0  . aspirin 81 MG tablet  Take 81 mg by mouth daily.    [name] atorvastatin (LIPITOR) 40 MG tablet Take 1 tablet (40 mg total) by mouth daily at 6 PM. 30 tablet 1  . cefdinir (OMNICEF) 300 MG capsule Take 1 capsule (300 mg total) by mouth 2 (two) times daily. 7 capsule 0  . Cholecalciferol (VITAMIN D) 2000 UNITS tablet Take 2,000 Units by mouth daily.    . cilostazol (PLETAL) 100 MG tablet Take 100 mg by mouth 2 (two) times daily.    . clopidogrel (PLAVIX) 75 MG tablet Take 75 mg by mouth daily.    . ferrous sulfate 325 (65 FE) MG tablet Take 325 mg by mouth 2 (two) times daily with a meal.     . gabapentin (NEURONTIN) 300 MG capsule Take 300 mg by mouth 3 (three) times daily.     . insulin glargine (LANTUS) 100 UNIT/ML injection Inject 0.08 mLs (8 Units total) into the skin at bedtime. (Patient taking differently: Inject 25 Units into the skin at bedtime. ) 10 mL 11  . metFORMIN (GLUCOPHAGE) 500 MG tablet Take 1,000 mg by mouth 2 (two) times daily with a meal.     . metoprolol tartrate (LOPRESSOR) 25 MG tablet Take 25 mg by mouth 2 (two) times daily.    . pantoprazole (PROTONIX) 40 MG tablet Take 1 tablet (  40 mg total) by mouth daily. 5 tablet 0  . phosphorus (K PHOS NEUTRAL) 155-852-130 MG tablet Take 2 tablets (500 mg total) by mouth 2 (two) times daily. 6 tablet 0  . vitamin B-12 (CYANOCOBALAMIN) 1000 MCG tablet Take 1,000 mcg by mouth daily.     No current facility-administered medications for this visit.     Physical Exam BP (!) 153/74   Pulse 93   Temp 97.8 F (36.6 C) (Skin)   Resp 20   Ht 5\' 7"  (1.702 m)   Wt 143 lb (64.9 kg)   SpO2 95% Comment: RA  BMI 22.35 kg/m  73 year old man in no acute distress Alert and oriented x3 with no focal deficits Lungs clear with equal breath sounds bilaterally, no rales or wheezing Cardiac regular rate and rhythm, normal S1 and S2 No peripheral edema  Diagnostic Tests: CT CHEST WITHOUT CONTRAST  TECHNIQUE: Multidetector CT imaging of the chest was performed  following the standard protocol without IV contrast.  COMPARISON:  11/27/2017 chest CT.  FINDINGS: Cardiovascular: Normal heart size. No significant pericardial effusion/thickening. Three-vessel coronary atherosclerosis status post CABG. Atherosclerotic nonaneurysmal thoracic aorta. Normal caliber pulmonary arteries.  Mediastinum/Nodes: No discrete thyroid nodules. Unremarkable esophagus. No pathologically enlarged axillary, mediastinal or hilar lymph nodes, noting limited sensitivity for the detection of hilar adenopathy on this noncontrast study.  Lungs/Pleura: No pneumothorax. No pleural effusion. No acute consolidative airspace disease or lung masses. A few scattered small solid pulmonary nodules in both lungs, largest 4 mm in the right middle lobe (series 3/image 80), all stable since 10/13/2015 chest CT, considered benign. Patchy confluent centrilobular ground-glass nodular opacities in right upper lobe are similar to mildly increased from prior. Mild centrilobular emphysema.  Upper abdomen: Partially visualized simple 4.1 cm upper right renal cyst.  Musculoskeletal: No aggressive appearing focal osseous lesions. Intact sternotomy wires. Mild thoracic spondylosis.  IMPRESSION: 1. Continued stability of scattered small solid pulmonary nodules since 2017 chest CT, considered benign. 2. Patchy confluent centrilobular ground-glass nodular opacities in the right upper lobe, similar to mildly increased from prior chest CT, most compatible with a nonspecific mild inflammatory process. Subacute hypersensitivity pneumonitis is a consideration.  Aortic Atherosclerosis (ICD10-I70.0) and Emphysema (ICD10-J43.9).   Electronically Signed   By: Ilona Sorrel M.D.   On: 01/09/2019 12:23 I personally reviewed the CT chest and concur with the findings noted above  Impression: [name] is a 73 year old man with a past medical history significant for atherosclerotic  cardiovascular disease, coronary artery disease, coronary artery bypass grafting x5, cavitary lung nodule that resolved, and remote tobacco abuse.  He has some small stable lung nodules that are unchanged for 3 years.  Those are consistent with benign nodules.  He also has some centrilobular groundglass opacities particularly in the right upper lobe.  Those are asymptomatic, but I think we need to keep an eye on that and we will plan to do another CT in a year.  Coronary artery disease-no anginal symptoms at present  Plan: Return in 1 year with CT chest  [name], MD [facility] [phone]

## 2019-02-11 ENCOUNTER — Other Ambulatory Visit: Payer: Self-pay

## 2019-02-11 NOTE — Patient Outreach (Signed)
[place] [facility]) Care Management  02/11/2019  TREYSHAUN KEATTS Jan 07, 1946 [id number]   Received referral from [place] on 02-05-2019 requesting to follow up with member for HTN.   Called member at preferred number and member answered phone and identified self. Introduced self and explained reason for the call.   Member stated "I am really busy right now". Asked member for a better time to call and member stated "later on".   Will send successful outreach letter. Will contact member within 3-4 business days.  [name], RN-BSN  [facility] Care Management  Community Care Management Coordinator  [phone] [email or web address]

## 2019-02-18 ENCOUNTER — Other Ambulatory Visit: Payer: Self-pay

## 2019-02-18 NOTE — Patient Outreach (Addendum)
  [place] [facility]) Care Management  02/18/2019  [name] Dec 20, 1945 [id number]  Unsuccessful Outreach x 2  Received referral from [place] on 02-05-2019 requesting to follow up with member for HTN.   Called member at preferred number and member's spouse [name] answered phone. Introduced self and explained reason for the call and [name] verified [employer] name only and stated member is not home right now.   Will contact member within 3-4 business days.  [name], RN-BSN  [facility] Care Management  Community Care Management Coordinator  [phone] [email or web address]

## 2019-02-21 ENCOUNTER — Other Ambulatory Visit: Payer: Self-pay

## 2019-02-21 NOTE — Patient Outreach (Signed)
[name] Care Management  02/21/2019  [name] [birth year] [id number]  Received referral from [place] on 02-05-2019 requesting to follow up with member for HTN.   Called member at preferred number and member answered phone and identified self. Introduced self and explained reason for the call.   Member stated "I am sorry but I am really busy right now". Asked member for a better time to call and member verbalized agreement with next week.  Will call member next week as agreed.  [name], RN-BSN  [facility] Care Management  Community Care Management Coordinator  [phone] [email or web address]

## 2019-02-24 ENCOUNTER — Other Ambulatory Visit: Payer: Self-pay

## 2019-02-24 NOTE — Patient Outreach (Signed)
[facility]) Care Management  02/24/2019  [name] September 07, 1945 [id number]  Called member at preferred number and member answered phone. Introduced self and explained reason for the call. HIPPA identifiers verified.   Explained that [facility] care coordination services, connection to local community resources and personal assistance in managing member's healthcare needs are provided to member without cost. Additional benefits explained as well.   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 Member verbalized agreement with services; however, member stated that he is currently on vacation in [place], [place] and will be available at later date. Will call member at end of month to complete initial assessment and member verbalized agreement.  Anthony Mola "ANN" [name], RN-BSN  [facility] Care Management  Community Care Management Coordinator  [phone] [email or web address]

## 2019-03-05 ENCOUNTER — Other Ambulatory Visit: Payer: Self-pay | Admitting: Physician Assistant

## 2019-03-10 ENCOUNTER — Encounter: Payer: Self-pay | Admitting: Internal Medicine

## 2019-03-12 ENCOUNTER — Other Ambulatory Visit: Payer: Self-pay

## 2019-03-12 NOTE — Patient Outreach (Signed)
[facility]) Care Management  03/12/2019  [name] 16-May-1946 [id number]  Unsuccessful Outreach x 3  Called member at preferred number and no answer. Received a return call from [facility] phone number, when answered and introduced self, call was discontinued. Multiple unsuccessful outreach calls to member.   Will send unsuccessful outreach letter. Will place on hold.  [name], RN-BSN  [facility] Care Management  Community Care Management Coordinator  [phone] [email or web address]

## 2019-03-14 NOTE — Telephone Encounter (Signed)
This encounter was created in error - please disregard.

## 2019-03-17 ENCOUNTER — Other Ambulatory Visit: Payer: Self-pay

## 2019-03-17 NOTE — Patient Outreach (Signed)
[facility]) Care Management  03/17/2019  WAYDEN SCHWERTNER 08-13-1946 [id number]  Multiple unsuccessful outreach attempts to member. Will close case per [facility] workflow due to inability to maintain contact with member. Will send case closure letter to PCP. No other [facility] disciplines involved at this time.  [name], RN-BSN  [facility] Care Management  Community Care Management Coordinator  [phone] [email or web address]

## 2019-03-18 ENCOUNTER — Encounter: Payer: Self-pay | Admitting: Gastroenterology

## 2019-03-18 ENCOUNTER — Other Ambulatory Visit: Payer: Self-pay

## 2019-03-18 ENCOUNTER — Ambulatory Visit (INDEPENDENT_AMBULATORY_CARE_PROVIDER_SITE_OTHER): Payer: No Typology Code available for payment source | Admitting: Gastroenterology

## 2019-03-18 DIAGNOSIS — R197 Diarrhea, unspecified: Secondary | ICD-10-CM | POA: Diagnosis not present

## 2019-03-18 DIAGNOSIS — R634 Abnormal weight loss: Secondary | ICD-10-CM | POA: Insufficient documentation

## 2019-03-18 NOTE — Patient Instructions (Addendum)
1. Continue Prevalite once daily for diarrhea.  2. I will retrieve prior stool tests from [facility] and discuss CT findings with Dr. Gala Romney. Further recommendations to follow once reviewed.  3. Call if you have recurrent vomiting. Please try to eat enough to maintain weight.

## 2019-03-18 NOTE — Progress Notes (Signed)
Primary Care Physician: [name], MD  Primary Gastroenterologist:  Garfield Cornea, MD   Chief Complaint  Patient presents with  . Diarrhea    3X DAILY WATERY  . Emesis    YESTERDAY    HPI: [name] is a 73 y.o. male here at the request of the [place] for further evaluation of diarrhea.  He was last seen in our practice in October 2019.  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   Patient states after colostomy reversal, he did fine for a couple of months, no diarrhea.  He lost about 20 pounds since August 2019.  He has been having 5 watery stools daily.  No melena or rectal bleeding.  Seen at [facility] ED July 2020.  Abdominal x-ray shows slightly distended transverse colon with fluid levels in the colon.  Question early large bowel obstruction.  CT abdomen pelvis with contrast showed moderate stool in the colon.  Sutures in the sigmoid colon.  Right colon, transverse colon, descending colon is mildly distended and tapers towards the anastomosis in the sigmoid colon.  Moderate stool in the sigmoid colon.  Mild apparent wall thickening in the rectosigmoid colon may be secondary to underdistention.  Limited evaluation.  Appendix appears normal.  Nonspecific fluid-filled small bowel.  Mildly prominent superficial inguinal lymph nodes right greater than left status post femoral femoral bypass.  Right external iliac stent.  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  July 2020 labs, CRP less than 2.9, amylase 69, lipase 131, TSH 2.650, sed rate 2, C. difficile toxin B gene PCR -027-NAP 1-B1 gene PCR negative, magnesium 1.1, potassium 3.1, sodium 136, BUN 16, creatinine 1.39, potassium 2.6, white blood cell count 9240, hemoglobin 15.1, hematocrit 44.7, platelets 283,000, INR 1. A1c 8.0 He also had screening for celiac disease but results currently are not available to me.  Appetite has  changed significantly.  No longer desires meat.  Only eats cold foods.  Denies any abdominal pain.  Having 5 watery stools daily.  Last night ate spaghetti and developed vomiting afterwards.  States he does not typically eat hot foods anymore.  While in the ED last month he was started on Prevalite once daily.  Seems to be helping somewhat.  Previously tried Pepto-Bismol and it helped as well.  Stools are more formed now, small in diameter (finger with).  Stomach rolls all the time.   Current Outpatient Medications  Medication Sig Dispense Refill  . albuterol (PROVENTIL HFA;VENTOLIN HFA) 108 (90 Base) MCG/ACT inhaler Inhale 2 puffs into the lungs every 6 (six) hours as needed for wheezing or shortness of breath. 1 Inhaler 2  . amLODipine (NORVASC) 10 MG tablet Take 1 tablet (10 mg total) by mouth daily. 30 tablet 0  . aspirin 81 MG tablet Take 81 mg by mouth daily.    [name] atorvastatin (LIPITOR) 40 MG tablet Take 1 tablet (40 mg total) by mouth daily at 6 PM. 30 tablet 1  . Cholecalciferol (VITAMIN D) 2000 UNITS tablet Take 2,000 Units by mouth daily.    . cilostazol (PLETAL) 100 MG tablet Take 100 mg by mouth 2 (two) times daily.    . clopidogrel (PLAVIX) 75 MG tablet Take 75 mg by mouth daily.    . ferrous sulfate 325 (65 FE) MG tablet Take 325 mg by mouth 2 (two) times daily with a meal.     .  gabapentin (NEURONTIN) 300 MG capsule Take 300 mg by mouth 3 (three) times daily.     . insulin glargine (LANTUS) 100 UNIT/ML injection Inject 0.08 mLs (8 Units total) into the skin at bedtime. (Patient taking differently: Inject 25 Units into the skin at bedtime. ) 10 mL 11  . metFORMIN (GLUCOPHAGE) 500 MG tablet Take 1,000 mg by mouth 2 (two) times daily with a meal.     . metoprolol tartrate (LOPRESSOR) 25 MG tablet Take 25 mg by mouth 2 (two) times daily.    . pantoprazole (PROTONIX) 40 MG tablet Take 1 tablet (40 mg total) by mouth daily. 5 tablet 0  . phosphorus (K PHOS NEUTRAL) 155-852-130 MG tablet  Take 2 tablets (500 mg total) by mouth 2 (two) times daily. 6 tablet 0  . vitamin B-12 (CYANOCOBALAMIN) 1000 MCG tablet Take 1,000 mcg by mouth daily.     No current facility-administered medications for this visit.     Allergies as of 03/18/2019  . (No Known Allergies)    ROS:  General: Negative for anorexia, weight loss, fever, chills, fatigue, weakness. ENT: Negative for hoarseness, difficulty swallowing , nasal congestion. CV: Negative for chest pain, angina, palpitations, dyspnea on exertion, peripheral edema.  Respiratory: Negative for dyspnea at rest, dyspnea on exertion, cough, sputum, wheezing.  GI: See history of present illness. GU:  Negative for dysuria, hematuria, urinary incontinence, urinary frequency, nocturnal urination.  Endo: Negative for unusual weight change.    Physical Examination:   BP (!) 145/84   Pulse 93   Temp (!) 97.3 F (36.3 C) (Oral)   Ht 5\' 7"  (1.702 m)   Wt 134 lb 9.6 oz (61.1 kg)   BMI 21.08 kg/m   General: Well-nourished, well-developed in no acute distress.  Eyes: No icterus. Mouth: Oropharyngeal mucosa moist and pink , no lesions erythema or exudate. Lungs: Clear to auscultation bilaterally.  Heart: Regular rate and rhythm, no murmurs rubs or gallops.  Abdomen: Bowel sounds are normal, nontender, nondistended, no hepatosplenomegaly or masses, no abdominal bruits or hernia , no rebound or guarding.   Extremities: No lower extremity edema. No clubbing or deformities. Neuro: Alert and oriented x 4   Skin: Warm and dry, no jaundice.   Psych: Alert and cooperative, normal mood and affect.  Labs:  Lab Results  Component Value Date   CREATININE 0.93 09/16/2018   BUN 13 09/16/2018   NA 138 09/16/2018   K 3.2 (L) 09/16/2018   CL 108 09/16/2018   CO2 23 09/16/2018   Lab Results  Component Value Date   ALT 11 09/15/2018   AST 17 09/15/2018   ALKPHOS 55 09/15/2018   BILITOT 0.6 09/15/2018   Lab Results  Component Value Date   WBC  6.1 09/16/2018   HGB 12.7 (L) 09/16/2018   HCT 39.0 09/16/2018   MCV 90.3 09/16/2018   PLT 275 09/16/2018   Lab Results  Component Value Date   HGBA1C 7.5 (H) 09/16/2018    Imaging Studies: No results found.

## 2019-03-19 ENCOUNTER — Other Ambulatory Visit: Payer: Self-pay | Admitting: Physician Assistant

## 2019-03-24 ENCOUNTER — Encounter: Payer: Self-pay | Admitting: Gastroenterology

## 2019-03-24 NOTE — Assessment & Plan Note (Signed)
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  CT abdomen pelvis contrast in July showed mildly distended proximal colon tapering towards the anastomosis in the sigmoid colon.  Moderate stool noted in the sigmoid colon.  Questionable mild apparent wall thickening versus underdistention.  C. difficile toxin B gene PCR was negative.  Celiac screen reportedly performed but results not available to me.  Patient not aware of results either.  They have been requested.  Having upwards of 5 watery stools daily, Prevalite is helped somewhat at first but having some increased watery stools last couple of days.  Notably had a 20 pound weight loss since August with significant change in appetite.  Retrieve pending records.  Further recommendations to follow.

## 2019-04-02 ENCOUNTER — Telehealth: Payer: Self-pay | Admitting: Gastroenterology

## 2019-04-02 DIAGNOSIS — R634 Abnormal weight loss: Secondary | ICD-10-CM

## 2019-04-02 DIAGNOSIS — R197 Diarrhea, unspecified: Secondary | ICD-10-CM

## 2019-04-02 NOTE — Telephone Encounter (Signed)
Please let patient know that reviewed all records available from [facility] ED visit back in 02/2019.   If he is still having diarrhea we need the following at [facility]  TSH, free T4 TTG, IgA and IgA Cdiff [facility] GI path panel

## 2019-04-02 NOTE — Telephone Encounter (Signed)
Noted  

## 2019-04-02 NOTE — Telephone Encounter (Signed)
Spoke with pt. He is aware that his records from the [place] were reviewed. Pt isn't having diarrhea x 1 week. Pt will call back if the diarrhea starts back and he will have labs/stool studies done then.

## 2019-05-14 ENCOUNTER — Encounter: Payer: Self-pay | Admitting: Internal Medicine

## 2019-05-21 ENCOUNTER — Emergency Department (HOSPITAL_COMMUNITY): Payer: Medicare Other

## 2019-05-21 ENCOUNTER — Other Ambulatory Visit: Payer: Self-pay

## 2019-05-21 ENCOUNTER — Encounter (HOSPITAL_COMMUNITY): Payer: Self-pay | Admitting: Emergency Medicine

## 2019-05-21 ENCOUNTER — Encounter: Payer: Self-pay | Admitting: Internal Medicine

## 2019-05-21 ENCOUNTER — Inpatient Hospital Stay (HOSPITAL_COMMUNITY)
Admission: EM | Admit: 2019-05-21 | Discharge: 2019-05-23 | DRG: 871 | Disposition: A | Discharge status: Home / Self Care | Payer: Medicare Other | Attending: Family Medicine | Admitting: Family Medicine

## 2019-05-21 DIAGNOSIS — Z951 Presence of aortocoronary bypass graft: Secondary | ICD-10-CM

## 2019-05-21 DIAGNOSIS — E1140 Type 2 diabetes mellitus with diabetic neuropathy, unspecified: Secondary | ICD-10-CM | POA: Diagnosis not present

## 2019-05-21 DIAGNOSIS — Z8249 Family history of ischemic heart disease and other diseases of the circulatory system: Secondary | ICD-10-CM | POA: Diagnosis not present

## 2019-05-21 DIAGNOSIS — Z800 Family history of malignant neoplasm of digestive organs: Secondary | ICD-10-CM

## 2019-05-21 DIAGNOSIS — E1122 Type 2 diabetes mellitus with diabetic chronic kidney disease: Secondary | ICD-10-CM | POA: Diagnosis not present

## 2019-05-21 DIAGNOSIS — Z9861 Coronary angioplasty status: Secondary | ICD-10-CM | POA: Diagnosis not present

## 2019-05-21 DIAGNOSIS — E860 Dehydration: Secondary | ICD-10-CM | POA: Diagnosis not present

## 2019-05-21 DIAGNOSIS — N183 Chronic kidney disease, stage 3 unspecified: Secondary | ICD-10-CM | POA: Diagnosis present

## 2019-05-21 DIAGNOSIS — K219 Gastro-esophageal reflux disease without esophagitis: Secondary | ICD-10-CM | POA: Diagnosis present

## 2019-05-21 DIAGNOSIS — E7800 Pure hypercholesterolemia, unspecified: Secondary | ICD-10-CM | POA: Diagnosis present

## 2019-05-21 DIAGNOSIS — I2510 Atherosclerotic heart disease of native coronary artery without angina pectoris: Secondary | ICD-10-CM | POA: Diagnosis present

## 2019-05-21 DIAGNOSIS — A419 Sepsis, unspecified organism: Secondary | ICD-10-CM | POA: Diagnosis not present

## 2019-05-21 DIAGNOSIS — Z823 Family history of stroke: Secondary | ICD-10-CM | POA: Diagnosis not present

## 2019-05-21 DIAGNOSIS — Z7982 Long term (current) use of aspirin: Secondary | ICD-10-CM | POA: Diagnosis not present

## 2019-05-21 DIAGNOSIS — R059 Cough, unspecified: Secondary | ICD-10-CM

## 2019-05-21 DIAGNOSIS — J189 Pneumonia, unspecified organism: Secondary | ICD-10-CM | POA: Diagnosis not present

## 2019-05-21 DIAGNOSIS — Z794 Long term (current) use of insulin: Secondary | ICD-10-CM | POA: Diagnosis not present

## 2019-05-21 DIAGNOSIS — Z20822 Contact with and (suspected) exposure to covid-19: Secondary | ICD-10-CM

## 2019-05-21 DIAGNOSIS — Z20828 Contact with and (suspected) exposure to other viral communicable diseases: Secondary | ICD-10-CM | POA: Diagnosis present

## 2019-05-21 DIAGNOSIS — R05 Cough: Secondary | ICD-10-CM

## 2019-05-21 DIAGNOSIS — Z87891 Personal history of nicotine dependence: Secondary | ICD-10-CM | POA: Diagnosis not present

## 2019-05-21 DIAGNOSIS — I129 Hypertensive chronic kidney disease with stage 1 through stage 4 chronic kidney disease, or unspecified chronic kidney disease: Secondary | ICD-10-CM | POA: Diagnosis not present

## 2019-05-21 DIAGNOSIS — R509 Fever, unspecified: Secondary | ICD-10-CM | POA: Diagnosis not present

## 2019-05-21 DIAGNOSIS — Z7902 Long term (current) use of antithrombotics/antiplatelets: Secondary | ICD-10-CM | POA: Diagnosis not present

## 2019-05-21 DIAGNOSIS — N179 Acute kidney failure, unspecified: Secondary | ICD-10-CM | POA: Diagnosis present

## 2019-05-21 DIAGNOSIS — R0602 Shortness of breath: Secondary | ICD-10-CM | POA: Diagnosis not present

## 2019-05-21 LAB — BASIC METABOLIC PANEL
Anion gap: 16 — ABNORMAL HIGH (ref 5–15)
BUN: 24 mg/dL — ABNORMAL HIGH (ref 8–23)
CO2: 28 mmol/L (ref 22–32)
Calcium: 9.8 mg/dL (ref 8.9–10.3)
Chloride: 90 mmol/L — ABNORMAL LOW (ref 98–111)
Creatinine, Ser: 1.56 mg/dL — ABNORMAL HIGH (ref 0.61–1.24)
GFR calc Af Amer: 51 mL/min — ABNORMAL LOW (ref >60)
GFR calc non Af Amer: 44 mL/min — ABNORMAL LOW (ref >60)
Glucose, Bld: 148 mg/dL — ABNORMAL HIGH (ref 70–99)
Potassium: 4.2 mmol/L (ref 3.5–5.1)
Sodium: 134 mmol/L — ABNORMAL LOW (ref 135–145)

## 2019-05-21 LAB — CBC
HCT: 44.5 % (ref 39.0–52.0)
Hemoglobin: 15.1 g/dL (ref 13.0–17.0)
MCH: 30.7 pg (ref 26.0–34.0)
MCHC: 33.9 g/dL (ref 30.0–36.0)
MCV: 90.4 fL (ref 80.0–100.0)
Platelets: 376 10*3/uL (ref 150–400)
RBC: 4.92 MIL/uL (ref 4.22–5.81)
RDW: 13.9 % (ref 11.5–15.5)
WBC: 20.1 10*3/uL — ABNORMAL HIGH (ref 4.0–10.5)
nRBC: 0 % (ref 0.0–0.2)

## 2019-05-21 LAB — URINALYSIS, ROUTINE W REFLEX MICROSCOPIC
Bacteria, UA: NONE SEEN
Bilirubin Urine: NEGATIVE
Glucose, UA: >=500 mg/dL — AB
Hgb urine dipstick: NEGATIVE
Ketones, ur: 5 mg/dL — AB
Leukocytes,Ua: NEGATIVE
Nitrite: NEGATIVE
Protein, ur: 30 mg/dL — AB
Specific Gravity, Urine: 1.012 (ref 1.005–1.030)
pH: 8 (ref 5.0–8.0)

## 2019-05-21 LAB — SARS CORONAVIRUS 2 BY RT PCR (HOSPITAL ORDER, PERFORMED IN ~~LOC~~ HOSPITAL LAB): SARS Coronavirus 2: NEGATIVE

## 2019-05-21 LAB — GLUCOSE, CAPILLARY: Glucose-Capillary: 129 mg/dL — ABNORMAL HIGH (ref 70–99)

## 2019-05-21 LAB — LACTIC ACID, PLASMA: Lactic Acid, Venous: 1.5 mmol/L (ref 0.5–1.9)

## 2019-05-21 MED ORDER — ONDANSETRON HCL 4 MG PO TABS: 4.0000 mg | ORAL_TABLET | Freq: Four times a day (QID) | ORAL | Status: DC | PRN

## 2019-05-21 MED ORDER — ACETAMINOPHEN 650 MG RE SUPP: 650.0000 mg | Freq: Four times a day (QID) | RECTAL | Status: DC | PRN

## 2019-05-21 MED ORDER — ACETAMINOPHEN 325 MG PO TABS: 650.0000 mg | ORAL_TABLET | Freq: Four times a day (QID) | ORAL | Status: DC | PRN

## 2019-05-21 MED ORDER — INSULIN ASPART 100 UNIT/ML ~~LOC~~ SOLN
0.0000 [IU] | Freq: Three times a day (TID) | SUBCUTANEOUS | Status: DC
  Administered 2019-05-23: 3 [IU] via SUBCUTANEOUS

## 2019-05-21 MED ORDER — ONDANSETRON HCL 4 MG/2ML IJ SOLN: 4.0000 mg | Freq: Four times a day (QID) | INTRAMUSCULAR | Status: DC | PRN

## 2019-05-21 MED ORDER — INSULIN ASPART 100 UNIT/ML ~~LOC~~ SOLN: 0.0000 [IU] | Freq: Every day | SUBCUTANEOUS | Status: DC

## 2019-05-21 MED ORDER — SODIUM CHLORIDE 0.9 % IV BOLUS
500.0000 mL | Freq: Once | INTRAVENOUS | Status: AC
  Administered 2019-05-21: 500 mL via INTRAVENOUS

## 2019-05-21 MED ORDER — LEVOFLOXACIN IN D5W 500 MG/100ML IV SOLN
500.0000 mg | Freq: Once | INTRAVENOUS | Status: AC
  Administered 2019-05-21: 17:00:00 500 mg via INTRAVENOUS
  Filled 2019-05-21: qty 100

## 2019-05-21 MED ORDER — SODIUM CHLORIDE 0.9% FLUSH
3.0000 mL | Freq: Once | INTRAVENOUS | Status: AC
  Administered 2019-05-21: 3 mL via INTRAVENOUS

## 2019-05-21 MED ORDER — SODIUM CHLORIDE 0.9 % IV SOLN
1.0000 g | INTRAVENOUS | Status: DC
  Administered 2019-05-22: 1 g via INTRAVENOUS
  Filled 2019-05-21 (×2): qty 10

## 2019-05-21 MED ORDER — GUAIFENESIN-DM 100-10 MG/5ML PO SYRP
10.0000 mL | ORAL_SOLUTION | Freq: Three times a day (TID) | ORAL | Status: DC
  Administered 2019-05-21 – 2019-05-23 (×5): 10 mL via ORAL
  Filled 2019-05-21 (×5): qty 10

## 2019-05-21 MED ORDER — ALBUTEROL SULFATE (2.5 MG/3ML) 0.083% IN NEBU: 2.5000 mg | INHALATION_SOLUTION | Freq: Four times a day (QID) | RESPIRATORY_TRACT | Status: DC | PRN

## 2019-05-21 MED ORDER — SODIUM CHLORIDE 0.9 % IV SOLN
500.0000 mg | INTRAVENOUS | Status: DC
  Administered 2019-05-22: 500 mg via INTRAVENOUS
  Filled 2019-05-21: qty 500

## 2019-05-21 MED ORDER — POLYETHYLENE GLYCOL 3350 17 G PO PACK: 17.0000 g | PACK | Freq: Every day | ORAL | Status: DC | PRN

## 2019-05-21 MED ORDER — SODIUM CHLORIDE 0.9 % IV SOLN
INTRAVENOUS | Status: AC
  Administered 2019-05-22 (×2): via INTRAVENOUS

## 2019-05-21 MED ORDER — ENOXAPARIN SODIUM 40 MG/0.4ML ~~LOC~~ SOLN
40.0000 mg | Freq: Every day | SUBCUTANEOUS | Status: DC
  Administered 2019-05-21 – 2019-05-22 (×2): 40 mg via SUBCUTANEOUS
  Filled 2019-05-21 (×2): qty 0.4

## 2019-05-21 NOTE — ED Notes (Signed)
Explained to pt reason for no visitor policy at this time due to his symptoms.

## 2019-05-21 NOTE — Telephone Encounter (Signed)
PATIENT SCHEDULED FOR RMR APPOINTMENT

## 2019-05-21 NOTE — ED Triage Notes (Signed)
N/V/D started last night,  Pt temp was 100 this morning.

## 2019-05-21 NOTE — ED Provider Notes (Signed)
[facility] EMERGENCY DEPARTMENT Provider Note   CSN: [id number] Arrival date & time: 05/21/19  1150     History   Chief Complaint Chief Complaint  Patient presents with  . Fever    HPI XEN AHLBORN is a 73 y.o. male.     HPI Patient states that he developed nonproductive cough yesterday.  Had multiple episodes of vomiting overnight.  He denies any diarrhea or abdominal pain.  Had low-grade fever this morning.  Denies any pain currently.  No known covert exposures. Past Medical History:  Diagnosis Date  . CAD S/P percutaneous coronary angioplasty 1998   PCI TO CX  . CKD (chronic kidney disease) stage 3, GFR 30-59 ml/min   . Diabetes mellitus without complication ([place])   . Diverticulitis 08/16/2017   hospitalized with diverticulitis/sepsis  . GERD (gastroesophageal reflux disease)    occassionally  . 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 Hypercholesteremia   . Hypertension   . Neuropathy   . Peripheral vascular disease ([place])    s/p R-L FEM-FEM BYPASS  . Pneumonia   . Tachycardia    after CABG, pt on medicine for this    Patient Active Problem List   Diagnosis Date Noted  . Diarrhea 03/18/2019  . Loss of weight 03/18/2019  . Klebsiella sepsis ([place]) 09/15/2018  . Urinary tract infection without hematuria   . Acute lower UTI 09/13/2018  . S/P colostomy takedown 03/26/2018  . Hypertension 12/12/2017  . Type 2 diabetes mellitus with diabetic neuropathy ([place]) 12/12/2017  . CKD (chronic kidney disease), stage III 12/12/2017  . Hypercholesteremia 12/12/2017  . Hypokalemia 12/12/2017  . GI bleed 12/06/2017  . Rectal bleed 12/06/2017  . Uncontrolled type 2 diabetes mellitus with hyperglycemia, with long-term current use of insulin ([place]) 12/06/2017  . Acute renal failure superimposed on stage 3 chronic kidney disease ([place]) 12/06/2017  . Malnutrition of moderate degree 12/06/2017  . Abnormal CT scan, colon 11/23/2017  . Constipation 11/23/2017  . Lobar pneumonia ([place])  10/02/2017  . Diverticulitis large intestine 08/19/2017  . Diverticulitis of large intestine with perforation and abscess without bleeding   . 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 CKD (chronic kidney disease) stage 3, GFR 30-59 ml/min 08/02/2017  . Sepsis due to undetermined organism ([place]) 08/01/2017  . GERD (gastroesophageal reflux disease) 08/01/2017  . PAD (peripheral artery disease) ([place]) 08/09/2016  . Claudication ([place]) 05/08/2016  . HCAP (healthcare-associated pneumonia) 01/12/2016  . Hyponatremia 01/12/2016  . Abnormal chest CT 10/13/2015  . Hx of CABG 10/13/2015  . Type 2 diabetes, HbA1c goal < 7% (HCC)   . Unstable angina ([employer])   . Hypomagnesemia   . Type 2 diabetes mellitus ([place]) 10/09/2015  . ASCVD (arteriosclerotic cardiovascular disease) 10/09/2015  . CKD (chronic kidney disease) stage 2, GFR 60-89 ml/min 10/09/2015  . Sepsis ([place]) 07/09/2015  . 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 PVD (peripheral vascular disease) ([place]) 07/09/2015  . Essential hypertension 07/09/2015  . Neuropathy, peripheral 07/09/2015  . HLD (hyperlipidemia) 07/09/2015  . AKI (acute kidney injury) ([place]) 07/09/2015  . CAD S/P percutaneous coronary angioplasty 08/14/1996    Past Surgical History:  Procedure Laterality Date  . ABDOMINAL AORTOGRAM Bilateral 09/19/2016   Procedure: iliac;  Surgeon: Serafina Mitchell, MD;  Location: [place] [facility];  Service: Cardiovascular;  Laterality: Bilateral;  . CARDIAC CATHETERIZATION  2003   with stent  . CARDIAC CATHETERIZATION N/A 10/11/2015   Procedure: Left Heart Cath  and Coronary Angiography;  Surgeon: Peter M Martinique, MD;  Location: [place] [facility];  Service: Cardiovascular;  Laterality: N/A;  . COLON RESECTION N/A 12/13/2017   Procedure: EXPLORATORY LAPAROTOMY, SIGMOID COLECTOMY WITH COLOSTOMY;  Surgeon: Donnie Mesa, MD;  Location: [place];  Service: General;  Laterality: N/A;  . COLONOSCOPY N/A 09/22/2013    Procedure: COLONOSCOPY;  Surgeon: Daneil Dolin, MD;  [facility];  Service: Endoscopy;  Laterality: N/A;  9:30 AM  . COLONOSCOPY N/A 02/19/2018   Procedure: COLONOSCOPY;  Surgeon: Daneil Dolin, MD;  [facility];  Service: Endoscopy;  Laterality: N/A;  2:00pm  . COLOSTOMY N/A 12/13/2017   Procedure: COLOSTOMY;  Surgeon: Donnie Mesa, MD;  Location: [place];  Service: General;  Laterality: N/A;  . COLOSTOMY REVERSAL N/A 03/26/2018   Procedure: COLOSTOMY REVERSAL;  Surgeon: Donnie Mesa, MD;  Location: [place];  Service: General;  Laterality: N/A;  . CORONARY ARTERY BYPASS GRAFT N/A 10/18/2015   Procedure: CORONARY ARTERY BYPASS GRAFTING (CABG) x  five, using left internal mammary artery and right leg greater saphenous vein harvested endoscopically;  Surgeon: [name], MD;  Location: [place];  Service: Open Heart Surgery;  Laterality: N/A;  . ENDARTERECTOMY FEMORAL Right 08/09/2016   Procedure: ENDARTERECTOMY FEMORAL WITH VEIN PATCH ANGIOPLASTY;  Surgeon: Serafina Mitchell, MD;  Location: [facility];  Service: Vascular;  Laterality: Right;  . ESOPHAGOGASTRODUODENOSCOPY N/A 10/24/2017   Dr. Gala Romney: hiatal hernia  . FEMORAL-FEMORAL BYPASS GRAFT Bilateral 08/09/2016   Procedure: REVISION BYPASS GRAFT RIGHT FEMORAL-LEFT FEMORAL ARTERY;  Surgeon: Serafina Mitchell, MD;  Location: [place];  Service: Vascular;  Laterality: Bilateral;  . FEMORAL-POPLITEAL BYPASS GRAFT    . PERIPHERAL VASCULAR CATHETERIZATION N/A 05/08/2016   Procedure: Lower Extremity Angiography;  Surgeon: Lorretta Harp, MD;  Location: [place] [facility];  Service: Cardiovascular;  Laterality: N/A;  . PERIPHERAL VASCULAR INTERVENTION Right 09/19/2016   Procedure: Peripheral Vascular Intervention;  Surgeon: Serafina Mitchell, MD;  Location: [place] [facility];  Service: Cardiovascular;  Laterality: Right;  ext iliac stent  . POLYPECTOMY  02/19/2018   Procedure: POLYPECTOMY;  Surgeon: Daneil Dolin, MD;  [facility];  Service: Endoscopy;;  . PROCTOSCOPY  10/24/2017   Procedure: PROCTOSCOPY;  Surgeon: Daneil Dolin, MD;  [facility];  Service: Endoscopy;;  . TEE WITHOUT CARDIOVERSION N/A 10/18/2015   Procedure: TRANSESOPHAGEAL ECHOCARDIOGRAM (TEE);  Surgeon: [name], MD;  Location: [place];  Service: Open Heart Surgery;  Laterality: N/A;        Home Medications    Prior to Admission medications   Medication Sig Start Date End Date Taking? Authorizing Provider  albuterol (PROVENTIL HFA;VENTOLIN HFA) 108 (90 Base) MCG/ACT inhaler Inhale 2 puffs into the lungs every 6 (six) hours as needed for wheezing or shortness of breath. 10/03/17  Yes Kathie Dike, MD  amLODipine (NORVASC) 10 MG tablet Take 1 tablet (10 mg total) by mouth daily. 04/01/18  Yes Regalado, Belkys A, MD  aspirin EC 81 MG tablet Take 81 mg by mouth daily.   Yes [provider]  atorvastatin (LIPITOR) 40 MG tablet Take 1 tablet (40 mg total) by mouth daily at 6 PM. 09/16/18  Yes Tat, Shanon Brow, MD  cilostazol (PLETAL) 100 MG tablet Take 100 mg by mouth 2 (two) times daily.   Yes [provider]  clopidogrel (PLAVIX) 75 MG tablet Take 75 mg by mouth daily.   Yes [provider]  empagliflozin (JARDIANCE) 25 MG TABS tablet Take 12.5 mg by mouth daily.    Yes [provider]  ferrous sulfate 325 (65 FE) MG tablet Take 325 mg by mouth 3 (three) times daily.    Yes [provider]  gabapentin (NEURONTIN) 600 MG tablet Take 600 mg by mouth 3 (three) times daily.    Yes [provider]  hydrochlorothiazide (HYDRODIURIL) 25 MG tablet Take 25 mg by mouth daily.   Yes [provider]  insulin glargine (LANTUS) 100 UNIT/ML injection Inject 0.08 mLs (8 Units total) into the skin at bedtime. Patient taking differently: Inject 20 Units into the skin at bedtime.  04/01/18  Yes Regalado, Belkys A, MD  metFORMIN (GLUCOPHAGE) 1000 MG tablet Take 1,000 mg by mouth 2 (two)  times daily with a meal.    Yes [provider]  metoprolol tartrate (LOPRESSOR) 50 MG tablet Take 25 mg by mouth 2 (two) times daily.    Yes [provider]  pantoprazole (PROTONIX) 40 MG tablet Take 1 tablet (40 mg total) by mouth daily. 08/19/17  Yes Dhungel, Nishant, MD  Semaglutide,0.25 or 0.5MG /DOS, (OZEMPIC, 0.25 OR 0.5 MG/DOSE,) 2 MG/1.5ML SOPN Inject 0.25 mg into the skin every Sunday. *Takes in the Evening**OZEMPIC   Yes [provider]    Family History Family History  Problem Relation Age of Onset  . Heart attack Mother   . Stroke Mother   . Colon cancer Brother        late 48s    Social History Social History   Tobacco Use  . Smoking status: Former Smoker    Packs/day: 2.00    Years: 30.00    Pack years: 60.00    Types: Cigarettes    Quit date: 08/14/2000    Years since quitting: 18.7  . Smokeless tobacco: Never Used  Substance Use Topics  . Alcohol use: No  . Drug use: No     Allergies   Patient has no known allergies.   Review of Systems Review of Systems  Constitutional: Positive for fatigue and fever.  HENT: Negative for sore throat and trouble swallowing.   Eyes: Negative for visual disturbance.  Respiratory: Positive for cough. Negative for shortness of breath.   Cardiovascular: Negative for chest pain.  Gastrointestinal: Positive for nausea and vomiting. Negative for abdominal pain, blood in stool and diarrhea.  Genitourinary: Negative for dysuria, flank pain and frequency.  Musculoskeletal: Negative for back pain, myalgias, neck pain and neck stiffness.  Skin: Negative for rash and wound.  Neurological: Negative for dizziness, weakness, light-headedness, numbness and headaches.  All other systems reviewed and are negative.    Physical Exam Updated Vital Signs BP (!) 161/84   Pulse (!) 102   Temp 99.4 F (37.4 C)   Resp 18   Ht 5\' 6"  (1.676 m)   Wt 63.5 kg   SpO2 94%   BMI 22.60 kg/m   Physical Exam Vitals  signs and nursing note reviewed.  Constitutional:      General: He is not in acute distress.    Appearance: Normal appearance. He is well-developed. He is not ill-appearing.  HENT:     Head: Normocephalic and atraumatic.  Eyes:     Pupils: Pupils are equal, round, and reactive to light.  Neck:     Musculoskeletal: Normal range of motion and neck supple.  Cardiovascular:     Rate and Rhythm: Regular rhythm. Tachycardia present.  Pulmonary:     Effort: Pulmonary effort is normal.  Breath sounds: Rhonchi present.     Comments: Few scattered rhonchi Abdominal:     General: Bowel sounds are normal. There is no distension.     Palpations: Abdomen is soft.     Tenderness: There is no abdominal tenderness. There is no guarding or rebound.  Musculoskeletal: Normal range of motion.        General: No swelling, tenderness, deformity or signs of injury.     Right lower leg: No edema.     Left lower leg: No edema.  Skin:    General: Skin is warm and dry.     Capillary Refill: Capillary refill takes less than 2 seconds.     Findings: No erythema or rash.  Neurological:     General: No focal deficit present.     Mental Status: He is alert and oriented to person, place, and time.  Psychiatric:        Behavior: Behavior normal.      ED Treatments / Results  Labs (all labs ordered are listed, but only abnormal results are displayed) Labs Reviewed  BASIC METABOLIC PANEL - Abnormal; Notable for the following components:      Result Value   Sodium 134 (*)    Chloride 90 (*)    Glucose, Bld 148 (*)    BUN 24 (*)    Creatinine, Ser 1.56 (*)    GFR calc non Af Amer 44 (*)    GFR calc Af Amer 51 (*)    Anion gap 16 (*)    All other components within normal limits  CBC - Abnormal; Notable for the following components:   WBC 20.1 (*)    All other components within normal limits  SARS CORONAVIRUS 2 (HOSPITAL ORDER, [place] [facility])  CULTURE, BLOOD (ROUTINE X 2)   CULTURE, BLOOD (ROUTINE X 2)    EKG None  Radiology Dg Chest Port 1 View  Result Date: 05/21/2019 CLINICAL DATA:  Fever and cough and shortness of breath EXAM: PORTABLE CHEST 1 VIEW COMPARISON:  09/13/2018, CT from 01/09/2019 FINDINGS: Cardiac shadow is mildly enlarged. Postsurgical changes are seen. Patchy infiltrates are noted within the right middle lobe as well as in the left mid and lower lung new from the prior CT examination. No sizable effusion is seen. No bony abnormality is noted. IMPRESSION: Patchy multifocal pneumonia bilaterally. Electronically Signed   By: Inez Catalina M.D.   On: 05/21/2019 14:39    Procedures Procedures (including critical care time)  Medications Ordered in ED Medications  sodium chloride flush (NS) 0.9 % injection 3 mL (3 mLs Intravenous Given 05/21/19 1728)  sodium chloride 0.9 % bolus 500 mL (0 mLs Intravenous Stopped 05/21/19 1834)  levofloxacin (LEVAQUIN) IVPB 500 mg (0 mg Intravenous Stopped 05/21/19 1834)     Initial Impression / Assessment and Plan / ED Course  I have reviewed the triage vital signs and the nursing notes.  Pertinent labs & imaging results that were available during my care of the patient were reviewed by me and considered in my medical decision making (see chart for details).        Patient desaturates to 90% with minimal exertion.  Evidence of bilateral pneumonia on x-ray.  COVID testing is negative.  Given dose of IV antibiotics and discussed with hospitalist.  Will admit.  Final Clinical Impressions(s) / ED Diagnoses   Final diagnoses:  Community acquired pneumonia, unspecified laterality    ED Discharge Orders    None  Julianne Rice, MD 05/21/19 1859

## 2019-05-21 NOTE — Telephone Encounter (Signed)
Recommend RMR only appt for follow up in the next 1-2 months.

## 2019-05-21 NOTE — H&P (Signed)
History and Physical    RAMIE ABDALLA [id number] DOB: 1946/08/14 DOA: 05/21/2019  PCP: [name], MD   Patient coming from: Home  I have personally briefly reviewed patient's old medical records in [place]  Chief Complaint: Cough, difficulty breathing, fever  HPI: [name] is a 73 y.o. male with medical history significant for  DM, HTN, CKD3, CABG X 5, PVD, presented to the ED with c/o several symptoms, that first started with a dry cough earlier in the day, he also had some difficulty breathing.  Later yesterday night at about 2 AM, he vomited, he has not had any subsequent episodes of vomiting.  No loose stools.  Last bowel movement was yesterday , it was normal.  He denies abdominal pain or chest pain.  No leg swelling.  This morning he recorded temperature of 100.  ED Course: Temperature 99.4, heart rate 114, blood pressure systolic 0000000 to 123456, O2 sats greater than 94% on room air.  Portable chest x-ray shows patchy multifocal pneumonia bilaterally.  WBC 20.  Cr elevated 1.56.  COVID-19 test negative. Started on IV Levaquin.  538mls normal saline bolus given.  Hospitalist admit for pneumonia.  Review of Systems: As per HPI all other systems reviewed and negative.  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   x 5   Hypercholesteremia    Hypertension    Neuropathy    Peripheral vascular disease ([place])    s/p R-L FEM-FEM BYPASS   Pneumonia    Tachycardia    after CABG, pt on medicine for this    Past Surgical History:  Procedure Laterality Date   ABDOMINAL AORTOGRAM Bilateral 09/19/2016   Procedure: iliac;  Surgeon: Serafina Mitchell,  MD;  Location: [facility];  Service: Cardiovascular;  Laterality: Bilateral;   CARDIAC CATHETERIZATION  2003   with stent   CARDIAC CATHETERIZATION N/A 10/11/2015   Procedure: Left Heart Cath and Coronary Angiography;  Surgeon: Peter M Martinique, MD;  Location: [facility];  Service: Cardiovascular;  Laterality: N/A;   COLON RESECTION N/A 12/13/2017   Procedure: EXPLORATORY LAPAROTOMY, SIGMOID COLECTOMY WITH COLOSTOMY;  Surgeon: Donnie Mesa, MD;  Location: [facility];  Service: General;  Laterality: N/A;   COLONOSCOPY N/A 09/22/2013   Procedure: COLONOSCOPY;  Surgeon: Daneil Dolin, MD;  [facility];  Service: Endoscopy;  Laterality: N/A;  9:30 AM   COLONOSCOPY N/A 02/19/2018   Procedure: COLONOSCOPY;  Surgeon: Daneil Dolin, MD;  [facility];  Service: Endoscopy;  Laterality: N/A;  2:00pm   COLOSTOMY N/A 12/13/2017   Procedure: COLOSTOMY;  Surgeon: Donnie Mesa, MD;  Location: [facility];  Service: General;  Laterality: N/A;   COLOSTOMY REVERSAL N/A 03/26/2018   Procedure: COLOSTOMY REVERSAL;  Surgeon: Donnie Mesa, MD;  Location: [facility];  Service: General;  Laterality: N/A;   CORONARY ARTERY BYPASS GRAFT N/A 10/18/2015   Procedure: CORONARY ARTERY BYPASS GRAFTING (CABG) x  five, using left internal mammary artery and right leg greater saphenous vein harvested endoscopically;  Surgeon: [name], MD;  Location: [facility];  Service: Open Heart Surgery;  Laterality: N/A;  ENDARTERECTOMY FEMORAL Right 08/09/2016   Procedure: ENDARTERECTOMY FEMORAL WITH VEIN PATCH ANGIOPLASTY;  Surgeon: Serafina Mitchell, MD;  Location[facility];  Service: Vascular;  Laterality: Right;   ESOPHAGOGASTRODUODENOSCOPY N/A 10/24/2017   Dr. Gala Romney: hiatal hernia   FEMORAL-FEMORAL BYPASS GRAFT Bilateral 08/09/2016   Procedure: REVISION BYPASS GRAFT RIGHT FEMORAL-LEFT FEMORAL ARTERY;  Surgeon: Serafina Mitchell, MD;  Location: [facility];  Service: Vascular;  Laterality: Bilateral;    FEMORAL-POPLITEAL BYPASS GRAFT     PERIPHERAL VASCULAR CATHETERIZATION N/A 05/08/2016   Procedure: Lower Extremity Angiography;  Surgeon: Lorretta Harp, MD;  Location: [facility];  Service: Cardiovascular;  Laterality: N/A;   PERIPHERAL VASCULAR INTERVENTION Right 09/19/2016   Procedure: Peripheral Vascular Intervention;  Surgeon: Serafina Mitchell, MD;  Location: [facility];  Service: Cardiovascular;  Laterality: Right;  ext iliac stent   POLYPECTOMY  02/19/2018   Procedure: POLYPECTOMY;  Surgeon: Daneil Dolin, MD;  [facility];  Service: Endoscopy;;   PROCTOSCOPY  10/24/2017   Procedure: PROCTOSCOPY;  Surgeon: Daneil Dolin, MD;  [facility];  Service: Endoscopy;;   TEE WITHOUT CARDIOVERSION N/A 10/18/2015   Procedure: TRANSESOPHAGEAL ECHOCARDIOGRAM (TEE);  Surgeon: [name], MD;  Location: [facility];  Service: Open Heart Surgery;  Laterality: N/A;     reports that he quit smoking about 18 years ago. His smoking use included cigarettes. He has a 60.00 pack-year smoking history. He has never used smokeless tobacco. He reports that he does not drink alcohol or use drugs.  No Known Allergies  Family History  Problem Relation Age of Onset   Heart attack Mother    Stroke Mother    Colon cancer Brother        late 36s    Prior to Admission medications   Medication Sig Start Date End Date Taking? Authorizing Provider  albuterol (PROVENTIL HFA;VENTOLIN HFA) 108 (90 Base) MCG/ACT inhaler Inhale 2 puffs into the lungs every 6 (six) hours as needed for wheezing or shortness of breath. 10/03/17  Yes Kathie Dike, MD  amLODipine (NORVASC) 10 MG tablet Take 1 tablet (10 mg total) by mouth daily. 04/01/18  Yes Regalado, Belkys A, MD  aspirin EC 81 MG tablet Take 81 mg by mouth daily.   Yes [provider]  atorvastatin (LIPITOR) 40 MG tablet Take 1 tablet (40 mg total) by mouth daily at 6 PM. 09/16/18  Yes Tat, Shanon Brow, MD  cilostazol  (PLETAL) 100 MG tablet Take 100 mg by mouth 2 (two) times daily.   Yes [provider]  clopidogrel (PLAVIX) 75 MG tablet Take 75 mg by mouth daily.   Yes [provider]  empagliflozin (JARDIANCE) 25 MG TABS tablet Take 12.5 mg by mouth daily.    Yes [provider]  ferrous sulfate 325 (65 FE) MG tablet Take 325 mg by mouth 3 (three) times daily.    Yes [provider]  gabapentin (NEURONTIN) 600 MG tablet Take 600 mg by mouth 3 (three) times daily.    Yes [provider]  hydrochlorothiazide (HYDRODIURIL) 25 MG tablet Take 25 mg by mouth daily.   Yes [provider]  insulin glargine (LANTUS) 100 UNIT/ML injection Inject 0.08 mLs (8 Units total) into the skin at bedtime. Patient taking differently: Inject 20 Units into the skin at bedtime.  04/01/18  Yes Regalado, Belkys A, MD  metFORMIN (GLUCOPHAGE) 1000 MG tablet Take 1,000 mg by mouth 2 (two) times daily with a meal.  Yes [provider]  metoprolol tartrate (LOPRESSOR) 50 MG tablet Take 25 mg by mouth 2 (two) times daily.    Yes [provider]  pantoprazole (PROTONIX) 40 MG tablet Take 1 tablet (40 mg total) by mouth daily. 08/19/17  Yes Dhungel, Nishant, MD  Semaglutide,0.25 or 0.5MG /DOS, (OZEMPIC, 0.25 OR 0.5 MG/DOSE,) 2 MG/1.5ML SOPN Inject 0.25 mg into the skin every Sunday. *Takes in the Evening**OZEMPIC   Yes [provider]    Physical Exam: Vitals:   05/21/19 1229 05/21/19 1434 05/21/19 1830 05/21/19 1922  BP:  (!) 154/87 (!) 161/84   Pulse:  (!) 107 (!) 102   Resp:   18   Temp:    98.1 F (36.7 C)  TempSrc:    Oral  SpO2:  95% 94%   Weight: 63.5 kg     Height: 5\' 6"  (1.676 m)       Constitutional: NAD, calm, comfortable Vitals:   05/21/19 1229 05/21/19 1434 05/21/19 1830 05/21/19 1922  BP:  (!) 154/87 (!) 161/84   Pulse:  (!) 107 (!) 102   Resp:   18   Temp:    98.1 F (36.7 C)  TempSrc:    Oral  SpO2:  95% 94%   Weight: 63.5 kg      Height: 5\' 6"  (1.676 m)      Eyes: PERRL, lids and conjunctivae normal ENMT: Mucous membranes are moist. Posterior pharynx clear of any exudate or lesions. Neck: normal, supple, no masses, no thyromegaly Respiratory: Crackles bilateral bases, no wheezing, normal respiratory effort. No accessory muscle use.  Cardiovascular: Mild tachycardia, regular rate and rhythm, no murmurs / rubs / gallops. No extremity edema. 2+ pedal pulses. No carotid bruits.  Abdomen: no tenderness, no masses palpated. No hepatosplenomegaly. Bowel sounds positive.  Musculoskeletal: no clubbing / cyanosis. No joint deformity upper and lower extremities. Good ROM, no contractures. Normal muscle tone.  Skin: no rashes, lesions, ulcers. No induration Neurologic: CN 2-12 grossly intact.  Strength 5/5 in all 4.  Psychiatric: Normal judgment and insight. Alert and oriented x 3. Normal mood.   Labs on Admission: I have personally reviewed following labs and imaging studies  CBC: Recent Labs  Lab 05/21/19 1253  WBC 20.1*  HGB 15.1  HCT 44.5  MCV 90.4  PLT [id number]   Basic Metabolic Panel: Recent Labs  Lab 05/21/19 1253  NA 134*  K 4.2  CL 90*  CO2 28  GLUCOSE 148*  BUN 24*  CREATININE 1.56*  CALCIUM 9.8    Radiological Exams on Admission: Dg Chest Port 1 View  Result Date: 05/21/2019 CLINICAL DATA:  Fever and cough and shortness of breath EXAM: PORTABLE CHEST 1 VIEW COMPARISON:  09/13/2018, CT from 01/09/2019 FINDINGS: Cardiac shadow is mildly enlarged. Postsurgical changes are seen. Patchy infiltrates are noted within the right middle lobe as well as in the left mid and lower lung new from the prior CT examination. No sizable effusion is seen. No bony abnormality is noted. IMPRESSION: Patchy multifocal pneumonia bilaterally. Electronically Signed   By: Inez Catalina M.D.   On: 05/21/2019 14:39    EKG: Sinus tachycardia rate 101.  QTc 476.  No significant change compared to prior.  Assessment/Plan Active  Problems:   CAP (community acquired pneumonia)   Community-acquired pneumonia-cough, difficulty breathing.  Not hypoxic.  With tachycardia and significant leukocytosis of 20.  Rules in for sepsis without lactic acidosis-1.5.  Doubt aspiration as patient is clear that cough started well before he vomited.  Also pattern of pneumonia not consistent with aspiration.  Portable chest x-ray shows patchy multifocal pneumonia.  No known COVID positive contacts, COVID-19 test negative. -IV Levaquin started in ED continue CAP coverage with IV ceftriaxone and azithromycin -500 ml bolus given, repeat 500 bolus continue N/s 100cc/hr x 1 day -Follow-up blood cultures -BMP, CBC a.m. -Mucolytics, supplemental O2, PRN albuterol  Acute kidney injury-creatinine elevated 1.56, baseline 0.9-1.1.  Likely secondary to sepsis, in the setting of 25mg  HCTZ use. -Hydrate -BMP a.m. -Hold home HCTZ  Hypertension-140s to 160s. -Resume home metoprolol, Norvasc. -Hold home HCTZ  Coronary artery disease s/p CABG X 5, PVD -EKG without significant changes compared to prior.  On 3 antiplatelet therapy -Resume home metoprolol, aspirin, Plavix, and cilostazol  Diabetes mellitus-random glucose 148. - SSI -Hold home metformin and Lantus.  DVT prophylaxis: Lovenox Code Status: Full Family Communication: None at bedside Disposition Plan: Per rounding team Consults called: None Admission status: Patient, telemetry I certify that at the point of admission it is my clinical judgment that the patient will require inpatient hospital care spanning beyond 2 midnights from the point of admission due to high intensity of service, high risk for further deterioration and high frequency of surveillance required. The following factors support the patient status of inpatient: Currently acquired pneumonia with sepsis.  Requiring IV antibiotics.   Bethena Roys MD [facility]  05/21/2019, 8:20 PM

## 2019-05-22 LAB — CBC
HCT: 42.5 % (ref 39.0–52.0)
Hemoglobin: 13.7 g/dL (ref 13.0–17.0)
MCH: 30 pg (ref 26.0–34.0)
MCHC: 32.2 g/dL (ref 30.0–36.0)
MCV: 93.2 fL (ref 80.0–100.0)
Platelets: 309 10*3/uL (ref 150–400)
RBC: 4.56 MIL/uL (ref 4.22–5.81)
RDW: 14 % (ref 11.5–15.5)
WBC: 13.4 10*3/uL — ABNORMAL HIGH (ref 4.0–10.5)
nRBC: 0 % (ref 0.0–0.2)

## 2019-05-22 LAB — GLUCOSE, CAPILLARY
Glucose-Capillary: 208 mg/dL — ABNORMAL HIGH (ref 70–99)
Glucose-Capillary: 118 mg/dL — ABNORMAL HIGH (ref 70–99)
Glucose-Capillary: 180 mg/dL — ABNORMAL HIGH (ref 70–99)

## 2019-05-22 LAB — BASIC METABOLIC PANEL
Anion gap: 12 (ref 5–15)
BUN: 20 mg/dL (ref 8–23)
CO2: 26 mmol/L (ref 22–32)
Calcium: 9.1 mg/dL (ref 8.9–10.3)
Chloride: 96 mmol/L — ABNORMAL LOW (ref 98–111)
Creatinine, Ser: 1.17 mg/dL (ref 0.61–1.24)
GFR calc Af Amer: >60 mL/min (ref >60)
GFR calc non Af Amer: >60 mL/min (ref >60)
Glucose, Bld: 139 mg/dL — ABNORMAL HIGH (ref 70–99)
Potassium: 3.6 mmol/L (ref 3.5–5.1)
Sodium: 134 mmol/L — ABNORMAL LOW (ref 135–145)

## 2019-05-22 LAB — HEMOGLOBIN A1C
Hgb A1c MFr Bld: 7.4 % — ABNORMAL HIGH (ref 4.8–5.6)
Mean Plasma Glucose: 165.68 mg/dL

## 2019-05-22 MED ORDER — ADULT MULTIVITAMIN W/MINERALS CH
1.0000 | ORAL_TABLET | Freq: Every day | ORAL | Status: DC
  Administered 2019-05-23: 1 via ORAL
  Filled 2019-05-22: qty 1

## 2019-05-22 MED ORDER — METOPROLOL TARTRATE 25 MG PO TABS
25.0000 mg | ORAL_TABLET | Freq: Two times a day (BID) | ORAL | Status: DC
  Administered 2019-05-22 – 2019-05-23 (×3): 25 mg via ORAL
  Filled 2019-05-22 (×3): qty 1

## 2019-05-22 MED ORDER — ATORVASTATIN CALCIUM 40 MG PO TABS
40.0000 mg | ORAL_TABLET | Freq: Every day | ORAL | Status: DC
  Administered 2019-05-22: 40 mg via ORAL
  Filled 2019-05-22: qty 1

## 2019-05-22 MED ORDER — CILOSTAZOL 100 MG PO TABS
100.0000 mg | ORAL_TABLET | Freq: Two times a day (BID) | ORAL | Status: DC
  Administered 2019-05-22 – 2019-05-23 (×2): 100 mg via ORAL
  Filled 2019-05-22 (×2): qty 1

## 2019-05-22 MED ORDER — ASPIRIN EC 81 MG PO TBEC
81.0000 mg | DELAYED_RELEASE_TABLET | Freq: Every day | ORAL | Status: DC
  Administered 2019-05-22 – 2019-05-23 (×2): 81 mg via ORAL
  Filled 2019-05-22 (×2): qty 1

## 2019-05-22 MED ORDER — GUAIFENESIN ER 600 MG PO TB12
600.0000 mg | ORAL_TABLET | Freq: Two times a day (BID) | ORAL | Status: DC
  Administered 2019-05-22 – 2019-05-23 (×2): 600 mg via ORAL
  Filled 2019-05-22 (×2): qty 1

## 2019-05-22 MED ORDER — AMLODIPINE BESYLATE 5 MG PO TABS
5.0000 mg | ORAL_TABLET | Freq: Every day | ORAL | Status: DC
  Administered 2019-05-22 – 2019-05-23 (×2): 5 mg via ORAL
  Filled 2019-05-22 (×3): qty 1

## 2019-05-22 MED ORDER — CLOPIDOGREL BISULFATE 75 MG PO TABS
75.0000 mg | ORAL_TABLET | Freq: Every day | ORAL | Status: DC
  Administered 2019-05-22 – 2019-05-23 (×2): 75 mg via ORAL
  Filled 2019-05-22 (×2): qty 1

## 2019-05-22 MED ORDER — ENSURE ENLIVE PO LIQD
237.0000 mL | Freq: Two times a day (BID) | ORAL | Status: DC
  Administered 2019-05-23: 237 mL via ORAL

## 2019-05-22 NOTE — Progress Notes (Signed)
Initial Nutrition Assessment  DOCUMENTATION CODES:   Non-severe (moderate) malnutrition in context of chronic illness  INTERVENTION:  -Ensure Enlive po BID, each supplement provides 350 kcal and 20 grams of protein -MVI daily  NUTRITION DIAGNOSIS:   Moderate Malnutrition related to chronic illness(CKD) as evidenced by mild fat depletion, moderate muscle depletion, severe muscle depletion.  GOAL:   Patient will meet greater than or equal to 90% of their needs  MONITOR:   PO intake, Supplement acceptance, I & O's, Labs, Weight trends  REASON FOR ASSESSMENT:   Malnutrition Screening Tool    ASSESSMENT:  73 year old male with medical history significant for T2DM, CKD3, HTN, CABG x 5, PVD s/p bypass, GERD, diverticulitis who presented to ED with complaints of fever, dry cough, difficulty breathing, and one episode of vomiting. CXR showed patchy multifocal bilateral pneumonia  Patient laying on back, arms folded, with his eyes closed at visit; patient pleasantly responded to all questions with eyes closed. He reports feeling a little better today; reports 75-80% of breakfast (cinamon toast, oatmeal, milk, and coffee) Endorses good appetite and intake at home; recalling 2 -3 meals daily (cereals and coffee for breakfast, turkey/ham sandwich for lunch, and a light dinner prepared by wife)  Patient wears an upper partial; denies chewing/swallowing difficulties.   He denies recent weight losses or gains; recalls 140 lb UBW  Patient meets criteria for mild malnutrition given as well as mild fat depletion and moderate LE muscle depletion noted on exam and would benefit from oral nutrition supplement. Patient endorses occassional Boost at home; amenable to chocolate Ensure daily during admission.  I/Os: +1461 ml since admit UOP: 700 ml since admit  Current wt 63.5 kg (139.7 lb) Weight history reviewed; appears patient experienced 8.4 lb loss from 6/02 (64.9 kg) to 8/04 (61.1 kg)  He has  since gained 5.3 lb and returned to more usual body weight; Noted 63.5 kg - 67.6 kg over the past year.   Medications reviewed and include: SSI, zithromax, recephin  Labs: CBGS 129 Na 134 WBC 13.4 Lab Results  Component Value Date   HGBA1C 7.5 (H) 09/16/2018    NUTRITION - FOCUSED PHYSICAL EXAM:    Most Recent Value  Orbital Region  Mild depletion  Upper Arm Region  Moderate depletion  Thoracic and Lumbar Region  No depletion  Buccal Region  Mild depletion  Temple Region  Mild depletion  Clavicle Bone Region  Mild depletion  Clavicle and Acromion Bone Region  Mild depletion  Scapular Bone Region  Unable to assess  Dorsal Hand  Mild depletion  Patellar Region  Severe depletion  Anterior Thigh Region  Unable to assess  Posterior Calf Region  Moderate depletion  Edema (RD Assessment)  None  Hair  Reviewed  Eyes  Reviewed  Mouth  Reviewed  Skin  Reviewed  Nails  Reviewed      Diet Order:   Diet Order            Diet heart healthy/carb modified Room service appropriate? Yes; Fluid consistency: Thin  Diet effective now              EDUCATION NEEDS:   No education needs have been identified at this time  Skin:  Skin Assessment: Reviewed RN Assessment  Last BM:  10/6  Height:   Ht Readings from Last 1 Encounters:  05/21/19 5\' 6"  (1.676 m)    Weight:   Wt Readings from Last 1 Encounters:  05/21/19 63.5 kg  Ideal Body Weight:  64.5 kg  BMI:  Body mass index is 22.6 kg/m.  Estimated Nutritional Needs:   Kcal:  1800-2000  Protein:  76-83  Fluid:  >/= 1.8 L/day   Lajuan Lines, RD, LDN Clinical Nutrition  Office [phone] After Hours/Weekend Pager: [phone]

## 2019-05-22 NOTE — Progress Notes (Signed)
Patient Demographics:    [name], is a 73 y.o. male, DOB - 1946-07-10, [id number]  Admit date - 05/21/2019   Admitting Physician Ejiroghene Arlyce Dice, MD  Outpatient Primary MD for the patient is [name], MD  LOS - 1   Chief Complaint  Patient presents with  . Fever        Subjective:    Rod Holler today has no further fevers, no emesis,  No chest pain, cough and shortness of breath persist, also has dyspnea on exertion  Assessment  & Plan :    Active Problems:   CAP (community acquired pneumonia)  Brief Summary 73 y.o. male with medical history significant for  DM, HTN, CKD3, CABG X 5, PVD admitted on 05/21/2019 with pneumonia and AKI  A/P 1)CAP--patient with multifocal pneumonia, cough and some shortness of breath persist, patient is down to 13.4 from 20.1, no further fevers -Continue mucolytics and bronchodilators  2)AKI----acute kidney injury due to dehydration in the setting of sepsis and compounded by HCTZ use. - creatinine on admission=1.56  ,   baseline creatinine = 0.9   , creatinine is now=1.1 , renally adjust medications, avoid nephrotoxic agents /dehydration /hypotension --Resolved with hydration -Continue to hold HCTZ -c/n to  hold Metformin, Ozempic, Jardiance and Lantus  3)DM2 - Hgb  A1C  7.4, metformin on hold due to dehydration and AKI,  -Use Novolog/Humalog Sliding scale insulin with Accu-Cheks/Fingersticks as ordered   4)CAD/PAD--prior history of CABG, chest pain-free, continue Plavix/aspirin/Pletal as well as metoprolol  5)HTN--stable, continue metoprolol and Norvasc HCTZ on hold due to AKI  Disposition/Need for in-Hospital Stay- patient unable to be discharged at this time due to -cough and shortness of breath persist, also has dyspnea on exertion--requires IV antibiotics  Code Status : Full code  Family Communication:   NA (patient is alert, awake and  coherent)  Disposition Plan  :  Home  Consults  :  Na  DVT Prophylaxis  :  Lovenox -   SCDs    Lab Results  Component Value Date   PLT 309 05/22/2019    Inpatient Medications  Scheduled Meds: . enoxaparin (LOVENOX) injection  40 mg Subcutaneous QHS  . feeding supplement (ENSURE ENLIVE)  237 mL Oral BID BM  . guaiFENesin-dextromethorphan  10 mL Oral Q8H  . insulin aspart  0-15 Units Subcutaneous TID WC  . insulin aspart  0-5 Units Subcutaneous QHS  . multivitamin with minerals  1 tablet Oral Daily   Continuous Infusions: . sodium chloride 100 mL/hr at 05/22/19 0247  . azithromycin    . cefTRIAXone (ROCEPHIN)  IV     PRN Meds:.acetaminophen **OR** acetaminophen, albuterol, ondansetron **OR** ondansetron (ZOFRAN) IV, polyethylene glycol    Anti-infectives (From admission, onward)   Start     Dose/Rate Route Frequency Ordered Stop   05/22/19 1800  azithromycin (ZITHROMAX) 500 mg in sodium chloride 0.9 % 250 mL IVPB     500 mg 250 mL/hr over 60 Minutes Intravenous Every 24 hours 05/21/19 1919     05/22/19 1600  cefTRIAXone (ROCEPHIN) 1 g in sodium chloride 0.9 % 100 mL IVPB     1 g 200 mL/hr over 30 Minutes Intravenous Every 24 hours 05/21/19 1919     05/21/19 1600  levofloxacin (LEVAQUIN) IVPB 500 mg     500 mg 100 mL/hr over 60 Minutes Intravenous  Once 05/21/19 1556 05/21/19 1834        Objective:   Vitals:   05/21/19 2100 05/21/19 2200 05/22/19 0509 05/22/19 1347  BP: (!) 153/71 (!) 161/83 128/61 (!) 163/84  Pulse: 90 99 98 99  Resp: (!) 21 17 16 19   Temp:  98.4 F (36.9 C) 98.4 F (36.9 C) 99.5 F (37.5 C)  TempSrc:  Oral Oral   SpO2: 93% 95% 98% 97%  Weight:      Height:        Wt Readings from Last 3 Encounters:  05/21/19 63.5 kg  03/18/19 61.1 kg  01/14/19 64.9 kg     Intake/Output Summary (Last 24 hours) at 05/22/2019 1417 Last data filed at 05/22/2019 1300 Gross per 24 hour  Intake 2641.67 ml  Output 1350 ml  Net 1291.67 ml      Physical Exam  Gen:- Awake Alert,  HEENT:- [place].AT, No sclera icterus Neck-Supple Neck,No JVD,.  Lungs-diminished in bases, no wheezing, few scattered rhonchi CV- S1, S2 normal, regular , dyspnea on exertion Abd-  +ve B.Sounds, Abd Soft, No tenderness,    Extremity/Skin:- No  edema, pedal pulses present  Psych-affect is appropriate, oriented x3 Neuro-no new focal deficits, no tremors   Data Review:   Micro Results Recent Results (from the past 240 hour(s))  SARS Coronavirus 2 [facility] order, Performed in [facility] hospital lab) Nasopharyngeal Nasopharyngeal Swab     Status: None   Collection Time: 05/21/19  3:56 PM   Specimen: Nasopharyngeal Swab  Result Value Ref Range Status   SARS Coronavirus 2 NEGATIVE NEGATIVE Final    Comment: (NOTE) If result is NEGATIVE SARS-CoV-2 target nucleic acids are NOT DETECTED. The SARS-CoV-2 RNA is generally detectable in upper and lower  respiratory specimens during the acute phase of infection. The lowest  concentration of SARS-CoV-2 viral copies this assay can detect is 250  copies / mL. A negative result does not preclude SARS-CoV-2 infection  and should not be used as the sole basis for treatment or other  patient management decisions.  A negative result may occur with  improper specimen collection / handling, submission of specimen other  than nasopharyngeal swab, presence of viral mutation(s) within the  areas targeted by this assay, and inadequate number of viral copies  (<250 copies / mL). A negative result must be combined with clinical  observations, patient history, and epidemiological information. If result is POSITIVE SARS-CoV-2 target nucleic acids are DETECTED. The SARS-CoV-2 RNA is generally detectable in upper and lower  respiratory specimens dur ing the acute phase of infection.  Positive  results are indicative of active infection with SARS-CoV-2.  Clinical  correlation with patient history and other diagnostic  information is  necessary to determine patient infection status.  Positive results do  not rule out bacterial infection or co-infection with other viruses. If result is PRESUMPTIVE POSTIVE SARS-CoV-2 nucleic acids MAY BE PRESENT.   A presumptive positive result was obtained on the submitted specimen  and confirmed on repeat testing.  While 2019 novel coronavirus  (SARS-CoV-2) nucleic acids may be present in the submitted sample  additional confirmatory testing may be necessary for epidemiological  and / or clinical management purposes  to differentiate between  SARS-CoV-2 and other Sarbecovirus currently known to infect humans.  If clinically indicated additional testing with an alternate test  methodology [phone]) is advised. The SARS-CoV-2 RNA is generally  detectable in upper and lower respiratory sp ecimens during the acute  phase of infection. The expected result is Negative. Fact Sheet for Patients:  StrictlyIdeas.no Fact Sheet for Healthcare Providers: BankingDealers.co.za This test is not yet approved or cleared by the [place] FDA and has been authorized for detection and/or diagnosis of SARS-CoV-2 by FDA under an Emergency Use Authorization (EUA).  This EUA will remain in effect (meaning this test can be used) for the duration of the COVID-19 declaration under Section 564(b)(1) of the Act, 21 U.S.C. section 360bbb-3(b)(1), unless the authorization is terminated or revoked sooner. Performed at [facility], [address] [place], [place] [address]   Culture, blood (Routine X 2) w Reflex to ID Panel     Status: None (Preliminary result)   Collection Time: 05/21/19  4:20 PM   Specimen: BLOOD  Result Value Ref Range Status   Specimen Description BLOOD RIGHT ANTECUBITAL  Final   Special Requests   Final    BOTTLES DRAWN AEROBIC AND ANAEROBIC Blood Culture adequate volume   Culture   Final    NO GROWTH < 24 HOURS Performed  at [facility], [address], [place], [place] [address]    Report Status PENDING  Incomplete  Culture, blood (Routine X 2) w Reflex to ID Panel     Status: None (Preliminary result)   Collection Time: 05/21/19  4:20 PM   Specimen: BLOOD RIGHT HAND  Result Value Ref Range Status   Specimen Description BLOOD RIGHT HAND  Final   Special Requests   Final    BOTTLES DRAWN AEROBIC AND ANAEROBIC Blood Culture adequate volume   Culture   Final    NO GROWTH < 24 HOURS Performed at [facility], [address], [place], [place] [address]    Report Status PENDING  Incomplete    Radiology Reports Dg Chest Port 1 View  Result Date: 05/21/2019 CLINICAL DATA:  Fever and cough and shortness of breath EXAM: PORTABLE CHEST 1 VIEW COMPARISON:  09/13/2018, CT from 01/09/2019 FINDINGS: Cardiac shadow is mildly enlarged. Postsurgical changes are seen. Patchy infiltrates are noted within the right middle lobe as well as in the left mid and lower lung new from the prior CT examination. No sizable effusion is seen. No bony abnormality is noted. IMPRESSION: Patchy multifocal pneumonia bilaterally. Electronically Signed   By: Inez Catalina M.D.   On: 05/21/2019 14:39     CBC Recent Labs  Lab 05/21/19 1253 05/22/19 0611  WBC 20.1* 13.4*  HGB 15.1 13.7  HCT 44.5 42.5  PLT 376 309  MCV 90.4 93.2  MCH 30.7 30.0  MCHC 33.9 32.2  RDW 13.9 14.0    Chemistries  Recent Labs  Lab 05/21/19 1253 05/22/19 0611  NA 134* 134*  K 4.2 3.6  CL 90* 96*  CO2 28 26  GLUCOSE 148* 139*  BUN 24* 20  CREATININE 1.56* 1.17  CALCIUM 9.8 9.1   ------------------------------------------------------------------------------------------------------------------ No results for input(s): CHOL, HDL, LDLCALC, TRIG, CHOLHDL, LDLDIRECT in the last 72 hours.  Lab Results  Component Value Date   HGBA1C 7.4 (H) 05/21/2019    ------------------------------------------------------------------------------------------------------------------ No results for input(s): TSH, T4TOTAL, T3FREE, THYROIDAB in the last 72 hours.  Invalid input(s): FREET3 ------------------------------------------------------------------------------------------------------------------ No results for input(s): VITAMINB12, FOLATE, FERRITIN, TIBC, IRON, RETICCTPCT in the last 72 hours.  Coagulation profile No results for input(s): INR, PROTIME in the last 168 hours.  No results for input(s): DDIMER in the last 72 hours.  Cardiac Enzymes No results for  input(s): CKMB, TROPONINI, MYOGLOBIN in the last 168 hours.  Invalid input(s): CK ------------------------------------------------------------------------------------------------------------------    Component Value Date/Time   BNP 92.0 03/26/2018 2030     Roxan Hockey M.D on 05/22/2019 at 2:17 PM  Go to [email or web address] - for contact info  [facility] - Office  [phone]

## 2019-05-23 LAB — NOVEL CORONAVIRUS, NAA: SARS-CoV-2, NAA: NOT DETECTED

## 2019-05-23 LAB — GLUCOSE, CAPILLARY: Glucose-Capillary: 156 mg/dL — ABNORMAL HIGH (ref 70–99)

## 2019-05-23 MED ORDER — AZITHROMYCIN 500 MG PO TABS: 500.0000 mg | ORAL_TABLET | Freq: Every day | ORAL | 0 refills | Status: DC

## 2019-05-23 MED ORDER — CEFDINIR 300 MG PO CAPS: 300.0000 mg | ORAL_CAPSULE | Freq: Two times a day (BID) | ORAL | 0 refills | Status: DC

## 2019-05-23 MED ORDER — SODIUM CHLORIDE 0.9 % IV SOLN
1.0000 g | Freq: Once | INTRAVENOUS | Status: AC
  Administered 2019-05-23: 11:00:00 1 g via INTRAVENOUS
  Filled 2019-05-23: qty 10

## 2019-05-23 MED ORDER — ASPIRIN EC 81 MG PO TBEC: 81.0000 mg | DELAYED_RELEASE_TABLET | Freq: Every day | ORAL | 2 refills | Status: DC

## 2019-05-23 MED ORDER — AZITHROMYCIN 250 MG PO TABS
500.0000 mg | ORAL_TABLET | Freq: Once | ORAL | Status: AC
  Administered 2019-05-23: 500 mg via ORAL
  Filled 2019-05-23: qty 2

## 2019-05-23 MED ORDER — GUAIFENESIN ER 600 MG PO TB12: 600.0000 mg | ORAL_TABLET | Freq: Two times a day (BID) | ORAL | 0 refills | Status: AC

## 2019-05-23 MED ORDER — AMLODIPINE BESYLATE 5 MG PO TABS
5.0000 mg | ORAL_TABLET | Freq: Once | ORAL | Status: AC
  Administered 2019-05-23: 5 mg via ORAL

## 2019-05-23 MED ORDER — ACETAMINOPHEN 325 MG PO TABS: 650.0000 mg | ORAL_TABLET | Freq: Four times a day (QID) | ORAL | 0 refills | Status: DC | PRN

## 2019-05-23 MED ORDER — CEFDINIR 300 MG PO CAPS: 300.0000 mg | ORAL_CAPSULE | Freq: Two times a day (BID) | ORAL | 0 refills | Status: AC

## 2019-05-23 MED ORDER — AZITHROMYCIN 500 MG PO TABS: 500.0000 mg | ORAL_TABLET | Freq: Every day | ORAL | 0 refills | Status: AC

## 2019-05-23 MED ORDER — ALBUTEROL SULFATE HFA 108 (90 BASE) MCG/ACT IN AERS: 2.0000 | INHALATION_SPRAY | Freq: Four times a day (QID) | RESPIRATORY_TRACT | 3 refills | Status: DC | PRN

## 2019-05-23 MED ORDER — HYDROCHLOROTHIAZIDE 12.5 MG PO TABS: 12.5000 mg | ORAL_TABLET | Freq: Every day | ORAL | 1 refills | Status: DC

## 2019-05-23 NOTE — Care Management Important Message (Signed)
Important Message  Patient Details  Name: [name] MRN: [id number] Date of Birth: [birth year]   Medicare Important Message Given:  Yes     [name] 05/23/2019, 12:32 PM

## 2019-05-23 NOTE — Discharge Summary (Signed)
[name], is a 73 y.o. male  DOB [birth year]  MRN [id number].  Admission date:  05/21/2019  Admitting Physician  Bethena Roys, MD  Discharge Date:  05/23/2019   Primary MD  [name], MD  Recommendations for primary care physician for things to follow:   - 1) please take azithromycin and Omnicef by mouth as prescribed starting on Saturday, 05/24/2019 for pneumonia 2) follow-up with your primary care physician within a week for recheck and reevaluation  Admission Diagnosis  Cough [R05] Community acquired pneumonia, unspecified laterality [J18.9]   Discharge Diagnosis  Cough [R05] Community acquired pneumonia, unspecified laterality [J18.9]    Active Problems:   CAP (community acquired pneumonia)      Past Medical History:  Diagnosis Date  . CAD S/P percutaneous coronary angioplasty 1998   PCI TO CX  . CKD (chronic kidney disease) stage 3, GFR 30-59 ml/min   . Diabetes mellitus without complication ([place])   . Diverticulitis 08/16/2017   hospitalized with diverticulitis/sepsis  . GERD (gastroesophageal reflux disease)    occassionally  . 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 Hypercholesteremia   . Hypertension   . Neuropathy   . Peripheral vascular disease ([place])    s/p R-L FEM-FEM BYPASS  . Pneumonia   . Tachycardia    after CABG, pt on medicine for this    Past Surgical History:  Procedure Laterality Date  . ABDOMINAL AORTOGRAM Bilateral 09/19/2016   Procedure: iliac;  Surgeon: Serafina Mitchell, MD;  Location: [place] [facility];  Service: Cardiovascular;  Laterality: Bilateral;  . CARDIAC CATHETERIZATION  2003   with stent  . CARDIAC CATHETERIZATION N/A 10/11/2015   Procedure: Left Heart Cath and Coronary Angiography;  Surgeon: Peter M Martinique, MD;  Location: [place] [facility];  Service: Cardiovascular;  Laterality: N/A;  . COLON RESECTION N/A 12/13/2017   Procedure: EXPLORATORY  LAPAROTOMY, SIGMOID COLECTOMY WITH COLOSTOMY;  Surgeon: Donnie Mesa, MD;  Location: [facility];  Service: General;  Laterality: N/A;  . COLONOSCOPY N/A 09/22/2013   Procedure: COLONOSCOPY;  Surgeon: Daneil Dolin, MD;  [facility];  Service: Endoscopy;  Laterality: N/A;  9:30 AM  . COLONOSCOPY N/A 02/19/2018   Procedure: COLONOSCOPY;  Surgeon: Daneil Dolin, MD;  [facility];  Service: Endoscopy;  Laterality: N/A;  2:00pm  . COLOSTOMY N/A 12/13/2017   Procedure: COLOSTOMY;  Surgeon: Donnie Mesa, MD;  Location: [place];  Service: General;  Laterality: N/A;  . COLOSTOMY REVERSAL N/A 03/26/2018   Procedure: COLOSTOMY REVERSAL;  Surgeon: Donnie Mesa, MD;  Location: [facility];  Service: General;  Laterality: N/A;  . CORONARY ARTERY BYPASS GRAFT N/A 10/18/2015   Procedure: CORONARY ARTERY BYPASS GRAFTING (CABG) x  five, using left internal mammary artery and right leg greater saphenous vein harvested endoscopically;  Surgeon: [name], MD;  Location: [place];  Service: Open Heart Surgery;  Laterality: N/A;  . ENDARTERECTOMY FEMORAL Right 08/09/2016   Procedure: ENDARTERECTOMY FEMORAL WITH VEIN PATCH  ANGIOPLASTY;  Surgeon: Serafina Mitchell, MD;  Location: [facility];  Service: Vascular;  Laterality: Right;  . ESOPHAGOGASTRODUODENOSCOPY N/A 10/24/2017   Dr. Gala Romney: hiatal hernia  . FEMORAL-FEMORAL BYPASS GRAFT Bilateral 08/09/2016   Procedure: REVISION BYPASS GRAFT RIGHT FEMORAL-LEFT FEMORAL ARTERY;  Surgeon: Serafina Mitchell, MD;  Location: [place];  Service: Vascular;  Laterality: Bilateral;  . FEMORAL-POPLITEAL BYPASS GRAFT    . PERIPHERAL VASCULAR CATHETERIZATION N/A 05/08/2016   Procedure: Lower Extremity Angiography;  Surgeon: Lorretta Harp, MD;  Location: [place] [facility];  Service: Cardiovascular;  Laterality: N/A;  . PERIPHERAL VASCULAR INTERVENTION Right 09/19/2016   Procedure: Peripheral Vascular Intervention;  Surgeon: Serafina Mitchell, MD;  Location: [place] [facility];   Service: Cardiovascular;  Laterality: Right;  ext iliac stent  . POLYPECTOMY  02/19/2018   Procedure: POLYPECTOMY;  Surgeon: Daneil Dolin, MD;  [facility];  Service: Endoscopy;;  . PROCTOSCOPY  10/24/2017   Procedure: PROCTOSCOPY;  Surgeon: Daneil Dolin, MD;  [facility];  Service: Endoscopy;;  . TEE WITHOUT CARDIOVERSION N/A 10/18/2015   Procedure: TRANSESOPHAGEAL ECHOCARDIOGRAM (TEE);  Surgeon: [name], MD;  Location: [place];  Service: Open Heart Surgery;  Laterality: N/A;       HPI  from the history and physical done on the day of admission:   - Chief Complaint: Cough, difficulty breathing, fever  HPI: [name] is a 73 y.o. male with medical history significant for  DM, HTN, CKD3, CABG X 5, PVD, presented to the ED with c/o several symptoms, that first started with a dry cough earlier in the day, he also had some difficulty breathing.  Later yesterday night at about 2 AM, he vomited, he has not had any subsequent episodes of vomiting.  No loose stools.  Last bowel movement was yesterday , it was normal.  He denies abdominal pain or chest pain.  No leg swelling.  This morning he recorded temperature of 100.  ED Course: Temperature 99.4, heart rate 114, blood pressure systolic 0000000 to 123456, O2 sats greater than 94% on room air.  Portable chest x-ray shows patchy multifocal pneumonia bilaterally.  WBC 20.  Cr elevated 1.56.  COVID-19 test negative. Started on IV Levaquin.  560mls normal saline bolus given.  Hospitalist admit for pneumonia.      Hospital Course:    Brief Summary 73 y.o.malewith medical history significant forDM, HTN, CKD3, CABG X 5, PVD admitted on 05/21/2019 with pneumonia and AKI  A/P 1)CAP--patient with multifocal pneumonia, cough and some shortness of breath has improved , patient is down to 13.4 from 20.1, no further fevers -Continue mucolytics and bronchodilators -Post ambulation O2 sats 97% on room air -Treated  with IV Rocephin and azithromycin, okay to discharge on p.o. Omnicef and azithromycin  2)AKI----acute kidney injury due to dehydration in the setting of sepsis and compounded by HCTZ use. - creatinine on admission=1.56  ,   baseline creatinine = 0.9   , creatinine is now=1.1 , renally adjust medications, avoid nephrotoxic agents /dehydration /hypotension --Resolved with hydration -Okay to decrease HCTZ to 12.5 mg daily   3)DM2 - Hgb  A1C  7.4, -restart home diabetic regimen including insulin and metformin as well as Jardiance   4)CAD/PAD--prior history of CABG, chest pain-free, continue Plavix/aspirin/Pletal as well as metoprolol  5)HTN--stable, continue metoprolol and Norvasc -Reduce HCTZ to 12.5 mg daily   Disposition-Home   code Status : Full code  Family Communication:   NA (patient  is alert, awake and coherent)  Disposition Plan  :  Home  Consults  :  Na  Discharge Condition: Stable  Follow UP-PCP within a week  Diet and Activity recommendation:  As advised  Discharge Instructions    Discharge Instructions    Call MD for:  difficulty breathing, headache or visual disturbances   Complete by: As directed    Call MD for:  persistant dizziness or light-headedness   Complete by: As directed    Call MD for:  persistant nausea and vomiting   Complete by: As directed    Call MD for:  severe uncontrolled pain   Complete by: As directed    Call MD for:  temperature >100.4   Complete by: As directed    Diet - low sodium heart healthy   Complete by: As directed    Diet Carb Modified   Complete by: As directed    Discharge instructions   Complete by: As directed    1) please take azithromycin and Omnicef by mouth as prescribed starting on Saturday, 05/24/2019 for pneumonia 2) follow-up with your primary care physician within a week for recheck and reevaluation   Increase activity slowly   Complete by: As directed         Discharge Medications     Allergies  as of 05/23/2019   No Known Allergies     Medication List    TAKE these medications   acetaminophen 325 MG tablet Commonly known as: TYLENOL Take 2 tablets (650 mg total) by mouth every 6 (six) hours as needed for mild pain, fever or headache (or Fever >/= 101).   albuterol 108 (90 Base) MCG/ACT inhaler Commonly known as: VENTOLIN HFA Inhale 2 puffs into the lungs every 6 (six) hours as needed for wheezing or shortness of breath.   amLODipine 10 MG tablet Commonly known as: NORVASC Take 1 tablet (10 mg total) by mouth daily.   aspirin EC 81 MG tablet Take 1 tablet (81 mg total) by mouth daily with breakfast. What changed: when to take this   atorvastatin 40 MG tablet Commonly known as: LIPITOR Take 1 tablet (40 mg total) by mouth daily at 6 PM.   azithromycin 500 MG tablet Commonly known as: ZITHROMAX Take 1 tablet (500 mg total) by mouth daily for 3 days. Start taking on: May 24, 2019   cefdinir 300 MG capsule Commonly known as: OMNICEF Take 1 capsule (300 mg total) by mouth 2 (two) times daily for 3 days. Start taking on: May 24, 2019   cilostazol 100 MG tablet Commonly known as: PLETAL Take 100 mg by mouth 2 (two) times daily.   clopidogrel 75 MG tablet Commonly known as: PLAVIX Take 75 mg by mouth daily.   empagliflozin 25 MG Tabs tablet Commonly known as: JARDIANCE Take 12.5 mg by mouth daily.   ferrous sulfate 325 (65 FE) MG tablet Take 325 mg by mouth 3 (three) times daily.   gabapentin 600 MG tablet Commonly known as: NEURONTIN Take 600 mg by mouth 3 (three) times daily.   guaiFENesin 600 MG 12 hr tablet Commonly known as: MUCINEX Take 1 tablet (600 mg total) by mouth 2 (two) times daily for 10 days.   hydrochlorothiazide 12.5 MG tablet Commonly known as: HYDRODIURIL Take 1 tablet (12.5 mg total) by mouth daily. What changed:   medication strength  how much to take   insulin glargine 100 UNIT/ML injection Commonly known as: LANTUS  Inject 0.08 mLs (8 Units total) into  the skin at bedtime. What changed: how much to take   metFORMIN 1000 MG tablet Commonly known as: GLUCOPHAGE Take 1,000 mg by mouth 2 (two) times daily with a meal.   metoprolol tartrate 50 MG tablet Commonly known as: LOPRESSOR Take 25 mg by mouth 2 (two) times daily.   Ozempic (0.25 or 0.5 MG/DOSE) 2 MG/1.5ML Sopn Generic drug: Semaglutide(0.25 or 0.5MG /DOS) Inject 0.25 mg into the skin every Sunday. *Takes in the Evening**OZEMPIC   pantoprazole 40 MG tablet Commonly known as: PROTONIX Take 1 tablet (40 mg total) by mouth daily.       Major procedures and Radiology Reports - PLEASE review detailed and final reports for all details, in brief -   Dg Chest Port 1 View  Result Date: 05/21/2019 CLINICAL DATA:  Fever and cough and shortness of breath EXAM: PORTABLE CHEST 1 VIEW COMPARISON:  09/13/2018, CT from 01/09/2019 FINDINGS: Cardiac shadow is mildly enlarged. Postsurgical changes are seen. Patchy infiltrates are noted within the right middle lobe as well as in the left mid and lower lung new from the prior CT examination. No sizable effusion is seen. No bony abnormality is noted. IMPRESSION: Patchy multifocal pneumonia bilaterally. Electronically Signed   By: Inez Catalina M.D.   On: 05/21/2019 14:39    Micro Results   Recent Results (from the past 240 hour(s))  Novel Coronavirus, NAA ([facility])     Status: None   Collection Time: 05/21/19 11:30 AM   Specimen: Nasopharyngeal(NP) swabs in vial transport medium   NASOPHARYNGE  TESTING  Result Value Ref Range Status   SARS-CoV-2, NAA Not Detected Not Detected Final    Comment: This nucleic acid amplification test was developed and its performance characteristics determined by [employer]. Nucleic acid amplification tests include PCR and TMA. This test has not been FDA cleared or approved. This test has been authorized by FDA under an Emergency Use Authorization (EUA). This test is  only authorized for the duration of time the declaration that circumstances exist justifying the authorization of the emergency use of in vitro diagnostic tests for detection of SARS-CoV-2 virus and/or diagnosis of COVID-19 infection under section 564(b)(1) of the Act, 21 U.S.C. [id number]) (1), unless the authorization is terminated or revoked sooner. When diagnostic testing is negative, the possibility of a false negative result should be considered in the context of a patient's recent exposures and the presence of clinical signs and symptoms consistent with COVID-19. An individual without symptoms of COVID-19 and who is not shedding SARS-CoV-2 virus would  expect to have a negative (not detected) result in this assay.   SARS Coronavirus 2 [facility] order, Performed in [facility] hospital lab) Nasopharyngeal Nasopharyngeal Swab     Status: None   Collection Time: 05/21/19  3:56 PM   Specimen: Nasopharyngeal Swab  Result Value Ref Range Status   SARS Coronavirus 2 NEGATIVE NEGATIVE Final    Comment: (NOTE) If result is NEGATIVE SARS-CoV-2 target nucleic acids are NOT DETECTED. The SARS-CoV-2 RNA is generally detectable in upper and lower  respiratory specimens during the acute phase of infection. The lowest  concentration of SARS-CoV-2 viral copies this assay can detect is 250  copies / mL. A negative result does not preclude SARS-CoV-2 infection  and should not be used as the sole basis for treatment or other  patient management decisions.  A negative result may occur with  improper specimen collection / handling, submission of specimen other  than nasopharyngeal swab, presence of viral mutation(s) within the  areas targeted by this assay, and inadequate number of viral copies  (<250 copies / mL). A negative result must be combined with clinical  observations, patient history, and epidemiological information. If result is POSITIVE SARS-CoV-2 target nucleic acids are DETECTED.  The SARS-CoV-2 RNA is generally detectable in upper and lower  respiratory specimens dur ing the acute phase of infection.  Positive  results are indicative of active infection with SARS-CoV-2.  Clinical  correlation with patient history and other diagnostic information is  necessary to determine patient infection status.  Positive results do  not rule out bacterial infection or co-infection with other viruses. If result is PRESUMPTIVE POSTIVE SARS-CoV-2 nucleic acids MAY BE PRESENT.   A presumptive positive result was obtained on the submitted specimen  and confirmed on repeat testing.  While 2019 novel coronavirus  (SARS-CoV-2) nucleic acids may be present in the submitted sample  additional confirmatory testing may be necessary for epidemiological  and / or clinical management purposes  to differentiate between  SARS-CoV-2 and other Sarbecovirus currently known to infect humans.  If clinically indicated additional testing with an alternate test  methodology [phone]) is advised. The SARS-CoV-2 RNA is generally  detectable in upper and lower respiratory sp ecimens during the acute  phase of infection. The expected result is Negative. Fact Sheet for Patients:  StrictlyIdeas.no Fact Sheet for Healthcare Providers: BankingDealers.co.za This test is not yet approved or cleared by the [place] FDA and has been authorized for detection and/or diagnosis of SARS-CoV-2 by FDA under an Emergency Use Authorization (EUA).  This EUA will remain in effect (meaning this test can be used) for the duration of the COVID-19 declaration under Section 564(b)(1) of the Act, 21 U.S.C. section 360bbb-3(b)(1), unless the authorization is terminated or revoked sooner. Performed at [facility], [address] [place], [place] [address]   Culture, blood (Routine X 2) w Reflex to ID Panel     Status: None (Preliminary result)   Collection Time: 05/21/19   4:20 PM   Specimen: BLOOD  Result Value Ref Range Status   Specimen Description BLOOD RIGHT ANTECUBITAL  Final   Special Requests   Final    BOTTLES DRAWN AEROBIC AND ANAEROBIC Blood Culture adequate volume   Culture   Final    NO GROWTH 2 DAYS Performed at [facility], [address], [place], [place] [address]    Report Status PENDING  Incomplete  Culture, blood (Routine X 2) w Reflex to ID Panel     Status: None (Preliminary result)   Collection Time: 05/21/19  4:20 PM   Specimen: BLOOD RIGHT HAND  Result Value Ref Range Status   Specimen Description BLOOD RIGHT HAND  Final   Special Requests   Final    BOTTLES DRAWN AEROBIC AND ANAEROBIC Blood Culture adequate volume   Culture   Final    NO GROWTH 2 DAYS Performed at [facility], [address] [place], [place] [address]    Report Status PENDING  Incomplete       Today   Subjective    [name] today has no new complaints -Cough and shortness of breath-improved significantly, post ambulation O2 sats 97% on room air          Patient has been seen and examined prior to discharge   Objective   Blood pressure (!) 154/75, pulse 87, temperature 98.2 F (36.8 C), resp. rate 16, height 5\' 6"  (1.676 m), weight 63.5 kg, SpO2 98 %.   Intake/Output Summary (Last  24 hours) at 05/23/2019 1111 Last data filed at 05/22/2019 1700 Gross per 24 hour  Intake 480 ml  Output 450 ml  Net 30 ml    Exam Gen:- Awake Alert, no acute distress, speaking in complete sentences HEENT:- [place].AT, No sclera icterus Neck-Supple Neck,No JVD,.  Lungs-improving air movement, no wheezing  CV- S1, S2 normal, regular, prior sternotomy scar Abd-  +ve B.Sounds, Abd Soft, No tenderness,    Extremity/Skin:- No  edema,   good pulses Psych-affect is appropriate, oriented x3 Neuro-no new focal deficits, no tremors    Data Review   CBC w Diff:  Lab Results  Component Value Date   WBC 13.4 (H) 05/22/2019   HGB 13.7 05/22/2019   HCT 42.5  05/22/2019   PLT 309 05/22/2019   LYMPHOPCT 4 09/13/2018   BANDSPCT 33 12/12/2017   MONOPCT 8 09/13/2018   EOSPCT 0 09/13/2018   BASOPCT 0 09/13/2018   CMP:  Lab Results  Component Value Date   NA 134 (L) 05/22/2019   K 3.6 05/22/2019   CL 96 (L) 05/22/2019   CO2 26 05/22/2019   BUN 20 05/22/2019   CREATININE 1.17 05/22/2019   CREATININE 1.28 (H) 04/25/2016   PROT 6.4 (L) 09/15/2018   ALBUMIN 3.2 (L) 09/15/2018   BILITOT 0.6 09/15/2018   ALKPHOS 55 09/15/2018   AST 17 09/15/2018   ALT 11 09/15/2018    Total Discharge time is about 33 minutes  Roxan Hockey M.D on 05/23/2019 at 11:11 AM  Go to [email or web address] -  for contact info  [facility] - Office  [phone]

## 2019-05-23 NOTE — Discharge Instructions (Signed)
- °  1)Please take Azithromycin and Omnicef by mouth as prescribed starting on Saturday, 05/24/2019 for pneumonia 2)Follow-up with your primary care physician within a week for recheck and reevaluation

## 2019-05-26 LAB — CULTURE, BLOOD (ROUTINE X 2)
Culture: NO GROWTH
Culture: NO GROWTH
Special Requests: ADEQUATE
Special Requests: ADEQUATE

## 2019-07-08 ENCOUNTER — Other Ambulatory Visit: Payer: Self-pay

## 2019-07-08 ENCOUNTER — Ambulatory Visit (INDEPENDENT_AMBULATORY_CARE_PROVIDER_SITE_OTHER): Payer: No Typology Code available for payment source | Admitting: Internal Medicine

## 2019-07-08 ENCOUNTER — Encounter: Payer: Self-pay | Admitting: Internal Medicine

## 2019-07-08 DIAGNOSIS — R197 Diarrhea, unspecified: Secondary | ICD-10-CM | POA: Diagnosis not present

## 2019-07-08 DIAGNOSIS — Z86010 Personal history of colonic polyps: Secondary | ICD-10-CM | POA: Diagnosis not present

## 2019-07-08 NOTE — Progress Notes (Signed)
Referring Provider:  Primary Care Physician:  [name], MD  Primary GI:   Patient Location: Home   Provider Location: [place] office   Reason for Visit: F/U diarrhea   Persons present on the virtual encounter, with roles:     Due to COVID-19, visit was conducted using virtual method.  Visit was requested by patient.  Virtual Visit via Telephone Note Due to COVID-19, visit is conducted virtually and was requested by patient.   I connected with [name] on 07/08/19 at  9:00 AM EST by telephone and verified that I am speaking with the correct person using two identifiers.   I discussed the limitations, risks, security and privacy concerns of performing an evaluation and management service by telephone and the availability of in person appointments. I also discussed with the patient that there may be a patient responsible charge related to this service. The patient expressed understanding and agreed to proceed.  Chief Complaint  Patient presents with   Diarrhea    none at all now. Doing fine and feeling good, "guts are doing great"     History of Present Illness:   73 year old gentleman with a complicated diverticulitis history status post a sigmoid resection colostomy and takedown.  Diarrhea he had earlier near has resolved.  Bowel function back to normal.  Reflux well controlled on Protonix 40 mg once daily. History of small colonic adenoma removed 2019; tentative plans for 1 more colonoscopy in about 3-1/2 years. Since last contact with this office, he was admitted to [facility] with community-acquired pneumonia.  No dysphagia.  No abdominal pain.  Eating well.   Past Medical History:  Diagnosis Date   CAD S/P percutaneous coronary angioplasty 1998   PCI TO CX   CKD (chronic kidney disease) stage 3, GFR 30-59 ml/min    Diabetes mellitus without complication ([place])    Diverticulitis 08/16/2017   hospitalized with diverticulitis/sepsis   GERD (gastroesophageal  reflux disease)    occassionally   Hx of CABG March 2017   x 5   Hypercholesteremia    Hypertension    Neuropathy    Peripheral vascular disease ([place])    s/p R-L FEM-FEM BYPASS   Pneumonia    Tachycardia    after CABG, pt on medicine for this     Past Surgical History:  Procedure Laterality Date   ABDOMINAL AORTOGRAM Bilateral 09/19/2016   Procedure: iliac;  Surgeon: Serafina Mitchell, MD;  Location: [place] [facility];  Service: Cardiovascular;  Laterality: Bilateral;   CARDIAC CATHETERIZATION  2003   with stent   CARDIAC CATHETERIZATION N/A 10/11/2015   Procedure: Left Heart Cath and Coronary Angiography;  Surgeon: Peter M Martinique, MD;  Location: [place] [facility];  Service: Cardiovascular;  Laterality: N/A;   COLON RESECTION N/A 12/13/2017   Procedure: EXPLORATORY LAPAROTOMY, SIGMOID COLECTOMY WITH COLOSTOMY;  Surgeon: Donnie Mesa, MD;  Location: [place];  Service: General;  Laterality: N/A;   COLONOSCOPY N/A 09/22/2013   Procedure: COLONOSCOPY;  Surgeon: Daneil Dolin, MD;  [facility];  Service: Endoscopy;  Laterality: N/A;  9:30 AM   COLONOSCOPY N/A 02/19/2018   Procedure: COLONOSCOPY;  Surgeon: Daneil Dolin, MD;  [facility];  Service: Endoscopy;  Laterality: N/A;  2:00pm   COLOSTOMY N/A 12/13/2017   Procedure: COLOSTOMY;  Surgeon: Donnie Mesa, MD;  Location: [place];  Service: General;  Laterality: N/A;   COLOSTOMY REVERSAL N/A 03/26/2018   Procedure: COLOSTOMY REVERSAL;  Surgeon: Georgette [place],  Rodman Key, MD;  Location: [place];  Service: General;  Laterality: N/A;   CORONARY ARTERY BYPASS GRAFT N/A 10/18/2015   Procedure: CORONARY ARTERY BYPASS GRAFTING (CABG) x  five, using left internal mammary artery and right leg greater saphenous vein harvested endoscopically;  Surgeon: [name], MD;  Location: [place];  Service: Open Heart Surgery;  Laterality: N/A;   ENDARTERECTOMY FEMORAL Right 08/09/2016   Procedure: ENDARTERECTOMY FEMORAL WITH VEIN  PATCH ANGIOPLASTY;  Surgeon: Serafina Mitchell, MD;  [facility];  Service: Vascular;  Laterality: Right;   ESOPHAGOGASTRODUODENOSCOPY N/A 10/24/2017   Dr. Gala Romney: hiatal hernia   FEMORAL-FEMORAL BYPASS GRAFT Bilateral 08/09/2016   Procedure: REVISION BYPASS GRAFT RIGHT FEMORAL-LEFT FEMORAL ARTERY;  Surgeon: Serafina Mitchell, MD;  Location: [place];  Service: Vascular;  Laterality: Bilateral;   FEMORAL-POPLITEAL BYPASS GRAFT     PERIPHERAL VASCULAR CATHETERIZATION N/A 05/08/2016   Procedure: Lower Extremity Angiography;  Surgeon: Lorretta Harp, MD;  Location: [place] [facility];  Service: Cardiovascular;  Laterality: N/A;   PERIPHERAL VASCULAR INTERVENTION Right 09/19/2016   Procedure: Peripheral Vascular Intervention;  Surgeon: Serafina Mitchell, MD;  Location: [place] [facility];  Service: Cardiovascular;  Laterality: Right;  ext iliac stent   POLYPECTOMY  02/19/2018   Procedure: POLYPECTOMY;  Surgeon: Daneil Dolin, MD;  [facility];  Service: Endoscopy;;   PROCTOSCOPY  10/24/2017   Procedure: PROCTOSCOPY;  Surgeon: Daneil Dolin, MD;  [facility];  Service: Endoscopy;;   TEE WITHOUT CARDIOVERSION N/A 10/18/2015   Procedure: TRANSESOPHAGEAL ECHOCARDIOGRAM (TEE);  Surgeon: [name], MD;  Location: [place];  Service: Open Heart Surgery;  Laterality: N/A;     Current Meds  Medication Sig   acetaminophen (TYLENOL) 325 MG tablet Take 2 tablets (650 mg total) by mouth every 6 (six) hours as needed for mild pain, fever or headache (or Fever >/= 101).   albuterol (VENTOLIN HFA) 108 (90 Base) MCG/ACT inhaler Inhale 2 puffs into the lungs every 6 (six) hours as needed for wheezing or shortness of breath.   amLODipine (NORVASC) 10 MG tablet Take 1 tablet (10 mg total) by mouth daily.   aspirin EC 81 MG tablet Take 1 tablet (81 mg total) by mouth daily with breakfast.   atorvastatin (LIPITOR) 40 MG tablet Take 1 tablet (40 mg total) by mouth daily at 6 PM.    cilostazol (PLETAL) 100 MG tablet Take 100 mg by mouth 2 (two) times daily.   clopidogrel (PLAVIX) 75 MG tablet Take 75 mg by mouth daily.   empagliflozin (JARDIANCE) 25 MG TABS tablet Take 12.5 mg by mouth daily.    ferrous sulfate 325 (65 FE) MG tablet Take 325 mg by mouth 3 (three) times daily.    gabapentin (NEURONTIN) 600 MG tablet Take 600 mg by mouth 3 (three) times daily.    hydrochlorothiazide (HYDRODIURIL) 12.5 MG tablet Take 1 tablet (12.5 mg total) by mouth daily.   insulin glargine (LANTUS) 100 UNIT/ML injection Inject 0.08 mLs (8 Units total) into the skin at bedtime. (Patient taking differently: Inject 20 Units into the skin at bedtime. )   metFORMIN (GLUCOPHAGE) 1000 MG tablet Take 1,000 mg by mouth 2 (two) times daily with a meal.    metoprolol tartrate (LOPRESSOR) 50 MG tablet Take 25 mg by mouth 2 (two) times daily.    pantoprazole (PROTONIX) 40 MG tablet Take 1 tablet (40 mg total) by mouth daily.   Semaglutide,0.25 or 0.5MG /DOS, (OZEMPIC, 0.25 OR 0.5  MG/DOSE,) 2 MG/1.5ML SOPN Inject 0.25 mg into the skin every Sunday. *Takes in the Evening**OZEMPIC     Family History  Problem Relation Age of Onset   Heart attack Mother    Stroke Mother    Colon cancer Brother        late 64s    Social History   Socioeconomic History   Marital status: Married    Spouse name: Not on file   Number of children: Not on file   Years of education: Not on file   Highest education level: Not on file  Occupational History   Not on file  Social Needs   Financial resource strain: Not on file   Food insecurity    Worry: Not on file    Inability: Not on file   Transportation needs    Medical: Not on file    Non-medical: Not on file  Tobacco Use   Smoking status: Former Smoker    Packs/day: 2.00    Years: 30.00    Pack years: 60.00    Types: Cigarettes    Quit date: 08/14/2000    Years since quitting: 18.9   Smokeless tobacco: Never Used  Substance and Sexual  Activity   Alcohol use: No   Drug use: No   Sexual activity: Not on file  Lifestyle   Physical activity    Days per week: Not on file    Minutes per session: Not on file   Stress: Not on file  Relationships   Social connections    Talks on phone: Not on file    Gets together: Not on file    Attends religious service: Not on file    Active member of club or organization: Not on file    Attends meetings of clubs or organizations: Not on file    Relationship status: Not on file  Other Topics Concern   Not on file  Social History Narrative   Not on file     Review of Systems:  As in history of present illness.   Observations/Objective: No distress. Unable to perform physical exam due to telephone encounter. No video available.   Assessment and Plan:   73 year old gentleman history of complicated diverticulitis status post sigmoid resection and with colostomy and takedown.  Doing very well.  Diarrhea resolved on its own.  GERD well-controlled on Protonix 40 mg daily   Follow Up Instructions:   Will consider 1 more colonoscopy in 3 1/2 years depending on how you are doing overall  Continue Protonix 40 mg daily  Follow-up with the [facility].  If you have any problems whatsoever, please do not hesitate to give me a call.  We will see you back in the office on an as-needed basis.    I discussed the assessment and treatment plan with the patient. The patient was provided an opportunity to ask questions and all were answered. The patient agreed with the plan and demonstrated an understanding of the instructions.   The patient was advised to call back or seek an in-person evaluation if the symptoms worsen or if the condition fails to improve as anticipated.  I provided 7 minutes of non-face-to-face time during this encounter.  Bridgette Habermann, MD [facility]

## 2019-07-08 NOTE — Patient Instructions (Signed)
Will consider 1 more colonoscopy in 3 1/2 years depending on how you are doing overall  Continue Protonix 40 mg daily  Follow-up with the [facility].  If you have any problems whatsoever, please do not hesitate to give me a call.  We will see you back in the office on an as-needed basis.

## 2019-10-08 DIAGNOSIS — Z23 Encounter for immunization: Secondary | ICD-10-CM | POA: Diagnosis not present

## 2019-11-03 DIAGNOSIS — Z23 Encounter for immunization: Secondary | ICD-10-CM | POA: Diagnosis not present

## 2019-12-05 ENCOUNTER — Other Ambulatory Visit: Payer: Self-pay

## 2019-12-05 ENCOUNTER — Encounter: Payer: Self-pay | Admitting: Cardiovascular Disease

## 2019-12-05 ENCOUNTER — Ambulatory Visit: Payer: Medicare Other | Admitting: Cardiovascular Disease

## 2019-12-05 VITALS — BP 158/70 | HR 67 | Ht 66.0 in | Wt 146.0 lb

## 2019-12-05 DIAGNOSIS — I10 Essential (primary) hypertension: Secondary | ICD-10-CM | POA: Diagnosis not present

## 2019-12-05 DIAGNOSIS — E7800 Pure hypercholesterolemia, unspecified: Secondary | ICD-10-CM | POA: Diagnosis not present

## 2019-12-05 DIAGNOSIS — Z951 Presence of aortocoronary bypass graft: Secondary | ICD-10-CM | POA: Diagnosis not present

## 2019-12-05 DIAGNOSIS — E782 Mixed hyperlipidemia: Secondary | ICD-10-CM | POA: Diagnosis not present

## 2019-12-05 DIAGNOSIS — I739 Peripheral vascular disease, unspecified: Secondary | ICD-10-CM | POA: Diagnosis not present

## 2019-12-05 LAB — HEPATIC FUNCTION PANEL
ALT: 26 IU/L (ref 0–44)
AST: 23 IU/L (ref 0–40)
Albumin: 4.6 g/dL (ref 3.7–4.7)
Alkaline Phosphatase: 94 IU/L (ref 39–117)
Bilirubin Total: 0.7 mg/dL (ref 0.0–1.2)
Bilirubin, Direct: 0.16 mg/dL (ref 0.00–0.40)
Total Protein: 7.1 g/dL (ref 6.0–8.5)

## 2019-12-05 LAB — LIPID PANEL
Chol/HDL Ratio: 4.6 ratio (ref 0.0–5.0)
Cholesterol, Total: 139 mg/dL (ref 100–199)
HDL: 30 mg/dL — ABNORMAL LOW (ref >39)
LDL Chol Calc (NIH): 75 mg/dL (ref 0–99)
Triglycerides: 204 mg/dL — ABNORMAL HIGH (ref 0–149)
VLDL Cholesterol Cal: 34 mg/dL (ref 5–40)

## 2019-12-05 NOTE — Progress Notes (Signed)
12/05/2019 [name]   July 14, 1946  [id number]  Primary Physician [name], MD Primary Cardiologist: Lorretta Harp MD FACP, [place], [place], [place]  HPI:  [name] is a 74 y.o.   married Caucasian male father of 2 children who I took care of for years and last saw in the office 09/09/2016 he has a history of CAD status post circumflex stenting by myself in 2003. He also has a history of peripheral vascular disease status post grafting of his lower extremities by Dr. Amedeo Plenty in the 90s. He had right to left femorofemoral crossover grafting. I performed stenting of his right lower extremity multiple times. He stopped smoking remotely. His cardiovascular risk factor profile includes treated hypertension, hyperlipidemia and type 2 diabetes. He was admitted in February with unstable angina and underwent cardiac catheterization by Dr. Martinique 10/11/15 revealing three-vessel disease. He ultimately underwent coronary artery bypass grafting on 10/18/15 by Dr. Roxan Hockey is a LIMA to his LAD, vein to diagonal branch, obtuse marginal branches 1 and 2 and the distal RCA. His postop course was unremarkable. He recently had Doppler studies revealing an occluded left iliac with high-grade right external iliac artery stenosis, occluded SFAs bilaterally with occluded left femoropopliteal bypass graft and a patent right to left femorofemoral crossover graft.. His right ABI was 0.6 by the left was 0.88. He was more symptomatic on the right and does have lifestyle limiting claudication. He underwent peripheral angiography by myself 05/08/16 revealing a patent right to left femorofemoral crossover graft, patent right iliac stent, occluded SFAs bilaterally with a high-grade 95% calcified right common femoral stenosis just proximal to the femorofemoral anastomosis. He also underwent right iliofemoral endarterectomy by Dr. Trula Slade 08/09/16 however his postprocedure Dopplers actually showed worsening of his claudication is  worse as well. He underwent peripheral angiography by Dr. Trula Slade 09/19/16 revealing a high-grade right external iliac artery stenosis which she ultimately stented. This resulted in marked improvement in his claudication symptoms.  He was readmitted in June with pneumonia and palpitations. He did see Almyra Deforest PA-C in the office cause of this and he was somewhat hypotensive. He was readmitted to the hospital because of sepsis and this was appropriately treated. He saw Almyra Deforest PA-C in the office 01/31/17 after being treated for sepsis as blood pressure had improved and his palpitations had resolved.  Since I saw him almost 3 years ago he continues to do well.  He does not smoke.  He denies chest pain, shortness of breath or claudication.   Current Meds  Medication Sig   acetaminophen (TYLENOL) 325 MG tablet Take 2 tablets (650 mg total) by mouth every 6 (six) hours as needed for mild pain, fever or headache (or Fever >/= 101).   albuterol (VENTOLIN HFA) 108 (90 Base) MCG/ACT inhaler Inhale 2 puffs into the lungs every 6 (six) hours as needed for wheezing or shortness of breath.   amLODipine (NORVASC) 10 MG tablet Take 1 tablet (10 mg total) by mouth daily.   aspirin EC 81 MG tablet Take 1 tablet (81 mg total) by mouth daily with breakfast.   atorvastatin (LIPITOR) 40 MG tablet Take 1 tablet (40 mg total) by mouth daily at 6 PM.   cilostazol (PLETAL) 100 MG tablet Take 100 mg by mouth 2 (two) times daily.   clopidogrel (PLAVIX) 75 MG tablet Take 75 mg by mouth daily.   empagliflozin (JARDIANCE) 25 MG TABS tablet Take 12.5 mg by mouth daily.    ferrous sulfate 325 (  65 FE) MG tablet Take 325 mg by mouth 3 (three) times daily.    gabapentin (NEURONTIN) 600 MG tablet Take 600 mg by mouth 3 (three) times daily.    hydrochlorothiazide (HYDRODIURIL) 12.5 MG tablet Take 1 tablet (12.5 mg total) by mouth daily.   insulin glargine (LANTUS) 100 UNIT/ML injection Inject 0.08 mLs (8 Units total) into  the skin at bedtime. (Patient taking differently: Inject 20 Units into the skin at bedtime. )   metFORMIN (GLUCOPHAGE) 1000 MG tablet Take 1,000 mg by mouth 2 (two) times daily with a meal.    metoprolol tartrate (LOPRESSOR) 50 MG tablet Take 25 mg by mouth 2 (two) times daily.    pantoprazole (PROTONIX) 40 MG tablet Take 1 tablet (40 mg total) by mouth daily.   Semaglutide,0.25 or 0.5MG /DOS, (OZEMPIC, 0.25 OR 0.5 MG/DOSE,) 2 MG/1.5ML SOPN Inject 0.25 mg into the skin every Sunday. *Takes in the Evening**OZEMPIC     No Known Allergies  Social History   Socioeconomic History   Marital status: Married    Spouse name: Not on file   Number of children: Not on file   Years of education: Not on file   Highest education level: Not on file  Occupational History   Not on file  Tobacco Use   Smoking status: Former Smoker    Packs/day: 2.00    Years: 30.00    Pack years: 60.00    Types: Cigarettes    Quit date: 08/14/2000    Years since quitting: 19.3   Smokeless tobacco: Never Used  Substance and Sexual Activity   Alcohol use: No   Drug use: No   Sexual activity: Not on file  Other Topics Concern   Not on file  Social History Narrative   Not on file   Social Determinants of Health   Financial Resource Strain:    Difficulty of Paying Living Expenses:   Food Insecurity:    Worried About Charity fundraiser in the Last Year:    Arboriculturist in the Last Year:   Transportation Needs:    Film/video editor (Medical):    Lack of Transportation (Non-Medical):   Physical Activity:    Days of Exercise per Week:    Minutes of Exercise per Session:   Stress:    Feeling of Stress :   Social Connections:    Frequency of Communication with Friends and Family:    Frequency of Social Gatherings with Friends and Family:    Attends Religious Services:    Active Member of Clubs or Organizations:    Attends Music therapist:    Marital  Status:   Intimate Partner Violence:    Fear of Current or Ex-Partner:    Emotionally Abused:    Physically Abused:    Sexually Abused:      Review of Systems: General: negative for chills, fever, night sweats or weight changes.  Cardiovascular: negative for chest pain, dyspnea on exertion, edema, orthopnea, palpitations, paroxysmal nocturnal dyspnea or shortness of breath Dermatological: negative for rash Respiratory: negative for cough or wheezing Urologic: negative for hematuria Abdominal: negative for nausea, vomiting, diarrhea, bright red blood per rectum, melena, or hematemesis Neurologic: negative for visual changes, syncope, or dizziness All other systems reviewed and are otherwise negative except as noted above.    Blood pressure (!) 158/70, pulse 67, height 5\' 6"  (1.676 m), weight 146 lb (66.2 kg), SpO2 98 %.  General appearance: alert and no distress Neck: no adenopathy,  no carotid bruit, no JVD, supple, symmetrical, trachea midline and thyroid not enlarged, symmetric, no tenderness/mass/nodules Lungs: clear to auscultation bilaterally Heart: regular rate and rhythm, S1, S2 normal, no murmur, click, rub or gallop Extremities: extremities normal, atraumatic, no cyanosis or edema Pulses: 2+ and symmetric Skin: Skin color, texture, turgor normal. No rashes or lesions Neurologic: Alert and oriented X 3, normal strength and tone. Normal symmetric reflexes. Normal coordination and gait  EKG sinus rhythm at 63 with inferior Q waves and early R wave transition with first-degree AV block.  I personally reviewed this EKG.  ASSESSMENT AND PLAN:   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 History of CAD status post circumflex intervention by myself in 2003.  Because of unstable angina he underwent cardiac catheterization by Dr. Martinique 10/11/2015 revealing three-vessel disease and the ultimately underwent CABG 10/18/2015 by Dr. Roxan Hockey with a LIMA to his LAD, vein to diagonal branch, obtuse  marginal branch 1 and 2 and distal RCA.  His postop course was unremarkable.  He denies chest pain or shortness of breath.  PVD (peripheral vascular disease) ([place]) History of peripheral arterial disease status post lower extremity vascular surgery by Dr. Amedeo Plenty in the 90s.  He had right to left femorofemoral crossover grafting.  I performed stenting of his right lower extremity multiple times.  I performed angiography on him 05/08/2016 revealing a patent right iliac stent, occluded left iliac, patent right to left femorofemoral crossover graft with occluded SFAs bilaterally.  He had three-vessel runoff on the right, 1 on the left with a 95% ostial left anterior tibial artery stenosis.  He underwent right iliac stenting and endarterectomy with patch angioplasty by Dr. Trula Slade 09/19/2016 and since that time his claudication has resolved.  He has not had lower extremity Dopplers in several years which we will repeat.  Essential hypertension History of essential hypertension with blood pressure measured today 158/70.  He is on amlodipine, hydrochlorothiazide and metoprolol.  He has not taken his medicines yet this morning.  HLD (hyperlipidemia) History of hyperlipidemia on atorvastatin 40 and a PCSK9 medication prescribed by the [facility] which he has since stopped.  We will check a lipid liver profile this morning.      Lorretta Harp MD FACP,FACC,FAHA, [facility] 12/05/2019 11:31 AM

## 2019-12-05 NOTE — Assessment & Plan Note (Signed)
History of essential hypertension with blood pressure measured today 158/70.  He is on amlodipine, hydrochlorothiazide and metoprolol.  He has not taken his medicines yet this morning.

## 2019-12-05 NOTE — Assessment & Plan Note (Signed)
History of CAD status post circumflex intervention by myself in 2003.  Because of unstable angina he underwent cardiac catheterization by Dr. Martinique 10/11/2015 revealing three-vessel disease and the ultimately underwent CABG 10/18/2015 by Dr. Roxan Hockey with a LIMA to his LAD, vein to diagonal branch, obtuse marginal branch 1 and 2 and distal RCA.  His postop course was unremarkable.  He denies chest pain or shortness of breath.

## 2019-12-05 NOTE — Assessment & Plan Note (Signed)
History of hyperlipidemia on atorvastatin 40 and a PCSK9 medication prescribed by the [facility] which he has since stopped.  We will check a lipid liver profile this morning.

## 2019-12-05 NOTE — Assessment & Plan Note (Signed)
History of peripheral arterial disease status post lower extremity vascular surgery by Dr. Amedeo Plenty in the 90s.  He had right to left femorofemoral crossover grafting.  I performed stenting of his right lower extremity multiple times.  I performed angiography on him 05/08/2016 revealing a patent right iliac stent, occluded left iliac, patent right to left femorofemoral crossover graft with occluded SFAs bilaterally.  He had three-vessel runoff on the right, 1 on the left with a 95% ostial left anterior tibial artery stenosis.  He underwent right iliac stenting and endarterectomy with patch angioplasty by Dr. Trula Slade 09/19/2016 and since that time his claudication has resolved.  He has not had lower extremity Dopplers in several years which we will repeat.

## 2019-12-05 NOTE — Patient Instructions (Signed)
Medication Instructions:  Your physician recommends that you continue on your current medications as directed. Please refer to the Current Medication list given to you today.  *If you need a refill on your cardiac medications before your next appointment, please call your pharmacy*   Lab Work: Today (lipid, hepatic)   If you have labs (blood work) drawn today and your tests are completely normal, you will receive your results only by: [name] MyChart Message (if you have MyChart) OR . A paper copy in the mail If you have any lab test that is abnormal or we need to change your treatment, we will call you to review the results.   Testing/Procedures: Your physician has requested that you have a lower extremity arterial duplex. This test is an ultrasound of the arteries in the legs. It looks at arterial blood flow in the legs. Allow one hour for Lower Arterial scans. There are no restrictions or special instructions  Follow-Up: At [facility], you and your health needs are our priority.  As part of our continuing mission to provide you with exceptional heart care, we have created designated Provider Care Teams.  These Care Teams include your primary Cardiologist (physician) and Advanced Practice Providers (APPs -  Physician Assistants and Nurse Practitioners) who all work together to provide you with the care you need, when you need it.  We recommend signing up for the patient portal called "MyChart".  Sign up information is provided on this After Visit Summary.  MyChart is used to connect with patients for Virtual Visits (Telemedicine).  Patients are able to view lab/test results, encounter notes, upcoming appointments, etc.  Non-urgent messages can be sent to your provider as well.   To learn more about what you can do with MyChart, go to [email or web address].    Your next appointment:   12 month(s)  The format for your next appointment:   In Person  Provider:   Quay Burow,  MD

## 2019-12-22 ENCOUNTER — Other Ambulatory Visit (HOSPITAL_COMMUNITY): Payer: Self-pay | Admitting: Cardiovascular Disease

## 2019-12-22 ENCOUNTER — Other Ambulatory Visit: Payer: Self-pay

## 2019-12-22 ENCOUNTER — Ambulatory Visit (HOSPITAL_COMMUNITY)
Admission: RE | Admit: 2019-12-22 | Discharge: 2019-12-22 | Disposition: A | Discharge status: Home / Self Care | Payer: Medicare Other | Source: Ambulatory Visit | Attending: Cardiovascular Disease | Admitting: Cardiovascular Disease

## 2019-12-22 ENCOUNTER — Ambulatory Visit (HOSPITAL_BASED_OUTPATIENT_CLINIC_OR_DEPARTMENT_OTHER)
Admission: RE | Admit: 2019-12-22 | Discharge: 2019-12-22 | Disposition: A | Discharge status: Home / Self Care | Payer: Medicare Other | Source: Ambulatory Visit | Attending: Cardiovascular Disease | Admitting: Cardiovascular Disease

## 2019-12-22 DIAGNOSIS — I739 Peripheral vascular disease, unspecified: Secondary | ICD-10-CM

## 2019-12-22 DIAGNOSIS — Z95828 Presence of other vascular implants and grafts: Secondary | ICD-10-CM

## 2019-12-22 DIAGNOSIS — Z95820 Peripheral vascular angioplasty status with implants and grafts: Secondary | ICD-10-CM

## 2019-12-24 ENCOUNTER — Telehealth: Payer: Self-pay

## 2019-12-24 DIAGNOSIS — I2510 Atherosclerotic heart disease of native coronary artery without angina pectoris: Secondary | ICD-10-CM

## 2019-12-24 DIAGNOSIS — I739 Peripheral vascular disease, unspecified: Secondary | ICD-10-CM

## 2019-12-24 DIAGNOSIS — Z95828 Presence of other vascular implants and grafts: Secondary | ICD-10-CM

## 2019-12-24 NOTE — Telephone Encounter (Signed)
Spoke to patient recent doppler results given.Advised to repeat in 12 months. 

## 2019-12-26 ENCOUNTER — Other Ambulatory Visit: Payer: Self-pay | Admitting: Thoracic Surgery (Cardiothoracic Vascular Surgery)

## 2019-12-26 DIAGNOSIS — R911 Solitary pulmonary nodule: Secondary | ICD-10-CM

## 2020-01-15 ENCOUNTER — Ambulatory Visit
Admission: RE | Admit: 2020-01-15 | Discharge: 2020-01-15 | Disposition: A | Discharge status: Home / Self Care | Payer: Medicare Other | Source: Ambulatory Visit | Attending: Thoracic Surgery (Cardiothoracic Vascular Surgery) | Admitting: Thoracic Surgery (Cardiothoracic Vascular Surgery)

## 2020-01-15 ENCOUNTER — Other Ambulatory Visit: Payer: Medicare Other

## 2020-01-15 DIAGNOSIS — R918 Other nonspecific abnormal finding of lung field: Secondary | ICD-10-CM | POA: Diagnosis not present

## 2020-01-15 DIAGNOSIS — R911 Solitary pulmonary nodule: Secondary | ICD-10-CM

## 2020-01-20 ENCOUNTER — Encounter: Payer: Self-pay | Admitting: Thoracic Surgery (Cardiothoracic Vascular Surgery)

## 2020-01-20 ENCOUNTER — Ambulatory Visit (INDEPENDENT_AMBULATORY_CARE_PROVIDER_SITE_OTHER): Payer: Medicare Other | Admitting: Thoracic Surgery (Cardiothoracic Vascular Surgery)

## 2020-01-20 ENCOUNTER — Other Ambulatory Visit: Payer: Self-pay

## 2020-01-20 VITALS — BP 177/75 | HR 60 | Temp 98.4°F | Resp 20 | Ht 66.0 in | Wt 142.4 lb

## 2020-01-20 DIAGNOSIS — R911 Solitary pulmonary nodule: Secondary | ICD-10-CM

## 2020-01-20 NOTE — Progress Notes (Signed)
[address]       [place] [address]             [phone]    HPI: [name] returns for a scheduled follow-up visit  [name] is a 74 year old man with a remote history of tobacco abuse (quit 19 years ago), lung nodules, atherosclerotic cardiovascular disease, coronary bypass grafting, and stage III chronic kidney disease.  He had coronary bypass grafting x5 in March 2017.  On his preoperative chest x-ray and CT he had a cavitary right upper lobe lung nodule.  A follow-up CT showed interval resolution.  We have continue to follow him with CT scans for multiple small nodules and groundglass opacities.  He denies any chest pain, pressure, or tightness.  He saw Dr. Gwenlyn Found back in April.  He is not having any unusual cough or shortness of breath.  He does complain that he has been staggering at times and is almost fallen a few times.  He is not having dizziness.  Past Medical History:  Diagnosis Date   CAD S/P percutaneous coronary angioplasty 1998   PCI TO CX   CKD (chronic kidney disease) stage 3, GFR 30-59 ml/min    Diabetes mellitus without complication ([place])    Diverticulitis 08/16/2017   hospitalized with diverticulitis/sepsis   GERD (gastroesophageal reflux disease)    occassionally   Hx of CABG March 2017   x 5   Hypercholesteremia    Hypertension    Neuropathy    Peripheral vascular disease ([place])    s/p R-L FEM-FEM BYPASS   Pneumonia    Tachycardia    after CABG, pt on medicine for this    Current Outpatient Medications  Medication Sig Dispense Refill   acetaminophen (TYLENOL) 325 MG tablet Take 2 tablets (650 mg total) by mouth every 6 (six) hours as needed for mild pain, fever or headache (or Fever >/= 101). 30 tablet 0   albuterol (VENTOLIN HFA) 108 (90 Base) MCG/ACT inhaler Inhale 2 puffs into the lungs every 6 (six) hours as needed for wheezing or shortness of breath. 18 g 3   amLODipine (NORVASC) 10 MG tablet Take 1 tablet  (10 mg total) by mouth daily. 30 tablet 0   aspirin EC 81 MG tablet Take 1 tablet (81 mg total) by mouth daily with breakfast. 30 tablet 2   atorvastatin (LIPITOR) 40 MG tablet Take 1 tablet (40 mg total) by mouth daily at 6 PM. 30 tablet 1   cilostazol (PLETAL) 100 MG tablet Take 100 mg by mouth 2 (two) times daily.     clopidogrel (PLAVIX) 75 MG tablet Take 75 mg by mouth daily.     empagliflozin (JARDIANCE) 25 MG TABS tablet Take 12.5 mg by mouth daily.      ferrous sulfate 325 (65 FE) MG tablet Take 325 mg by mouth 3 (three) times daily.      gabapentin (NEURONTIN) 600 MG tablet Take 600 mg by mouth 3 (three) times daily.      hydrochlorothiazide (HYDRODIURIL) 12.5 MG tablet Take 1 tablet (12.5 mg total) by mouth daily. 30 tablet 1   insulin glargine (LANTUS) 100 UNIT/ML injection Inject 0.08 mLs (8 Units total) into the skin at bedtime. (Patient taking differently: Inject 20 Units into the skin at bedtime. ) 10 mL 11   metFORMIN (GLUCOPHAGE) 1000 MG tablet Take 1,000 mg by mouth 2 (two) times daily with a meal.      metoprolol tartrate (LOPRESSOR) 50  MG tablet Take 25 mg by mouth 2 (two) times daily.      pantoprazole (PROTONIX) 40 MG tablet Take 1 tablet (40 mg total) by mouth daily. 5 tablet 0   Semaglutide,0.25 or 0.5MG /DOS, (OZEMPIC, 0.25 OR 0.5 MG/DOSE,) 2 MG/1.5ML SOPN Inject 0.25 mg into the skin every Sunday. *Takes in the Evening**OZEMPIC     No current facility-administered medications for this visit.    Physical Exam BP (!) 177/75 (BP Location: Right Arm, Patient Position: Sitting, Cuff Size: Normal)    Pulse 60    Temp 98.4 F (36.9 C) (Temporal)    Resp 20    Ht 5\' 6"  (1.676 m)    Wt 142 lb 6.4 oz (64.6 kg)    SpO2 97% Comment: RA   BMI 22.72 kg/m  74 year old man in no acute distress Alert and oriented x3 with no focal deficits Cardiac regular rate and rhythm normal S1 and S2 Lungs diminished bilaterally, but clear no rales or wheezing No peripheral  edema  Diagnostic Tests: CT CHEST WITHOUT CONTRAST  TECHNIQUE: Multidetector CT imaging of the chest was performed following the standard protocol without IV contrast.  COMPARISON:  Chest CT 01/09/2019.  FINDINGS: Cardiovascular: Heart size is normal. There is no significant pericardial fluid, thickening or pericardial calcification. There is aortic atherosclerosis, as well as atherosclerosis of the great vessels of the mediastinum and the coronary arteries, including calcified atherosclerotic plaque in the left main, left anterior descending, left circumflex and right coronary arteries. Status post median sternotomy for CABG including LIMA to the LAD.  Mediastinum/Nodes: No pathologically enlarged mediastinal or hilar lymph nodes. Please note that accurate exclusion of hilar adenopathy is limited on noncontrast CT scans. Esophagus is unremarkable in appearance. No axillary lymphadenopathy.  Lungs/Pleura: Multiple small pulmonary nodules scattered throughout the lungs bilaterally, generally stable in size, number and distribution to prior examinations, considered definitively benign. The exception to this is a new 4 mm subpleural nodule in the posterior aspect of the left upper lobe abutting the major fissure (axial image 32 of series 8). No other larger more suspicious appearing pulmonary nodules or masses are noted. No acute consolidative airspace disease. No pleural effusions. Some patchy areas of mild ground-glass attenuation are also noted most evident in the lung bases bilaterally (right greater than left) and in the right upper lobe, slightly increased compared to the prior study.  Upper Abdomen: Aortic atherosclerosis.  Musculoskeletal: Median sternotomy wires. There are no aggressive appearing lytic or blastic lesions noted in the visualized portions of the skeleton.  IMPRESSION: 1. New 4 mm subpleural nodule in the posterior aspect of the left upper  lobe, nonspecific, but statistically likely benign. No follow-up needed if patient is low-risk. Non-contrast chest CT can be considered in 12 months if patient is high-risk. This recommendation follows the consensus statement: Guidelines for Management of Incidental Pulmonary Nodules Detected on CT Images: From the Fleischner Society 2017; Radiology 2017; 284:228-243. 2. All other previously noted pulmonary nodules are stable compared to prior studies, considered definitively benign. 3. Slight increased prominence of patchy areas of ground-glass attenuation in the lungs bilaterally. This could indicate underlying interstitial lung disease. Further evaluation with nonemergent high-resolution chest CT is recommended in 6-12 months to re-evaluate these findings and assess for temporal changes in the appearance of the lung parenchyma. Nonemergent outpatient referral to Pulmonology is also recommended for further clinical evaluation. 4. Aortic atherosclerosis, in addition to left main and 3 vessel coronary artery disease. Status post median sternotomy for CABG including  LIMA to the LAD.  Aortic Atherosclerosis (ICD10-I70.0).   Electronically Signed   By: Vinnie Langton M.D.   On: 01/15/2020 09:46 I personally reviewed the CT images and concur with the findings noted above  Impression: [name] is a 74 year old man with a past medical history of tobacco abuse (quit 19 years ago), lung nodules, hypertension, hyperlipidemia, atherosclerotic cardiovascular disease, coronary bypass grafting, and stage III chronic kidney disease.    CAD-status post CABG x5 in 2017.  No recurrent anginal symptoms.  Followed by Dr. Gwenlyn Found.  Hypertension-blood pressure elevated today.  Also was a little high when he saw Dr. Gwenlyn Found in April at 815 systolic.  He has not been checking his blood pressure at home.  I recommended he do so.  If his systolic pressures consistently above 140 he needs further medical  management and should contact Dr. Kennon Holter office.  Staggering-symptoms are really sound orthostatic.  He is not having vertigo.  He needs further work-up for that I recommended he contact Dr. Anda Latina his primary care physician in order to do so.  4 mm lung nodule-appears more infectious or inflammatory in nature, but given his history does need follow-up.  We will plan to do so in a year.  Groundglass opacity-patchy areas bilaterally.  Slightly increased on today's exam.  We will plan to do a high-resolution CT may see him back in a year.  If he were to start developing cough or shortness of breath he should contact me and we can refer him to pulmonology  Plan: Follow-up with primary care regarding staggering Return in 1 year with high resolution CT chest to further evaluate lung nodule and possible ILD.  [name], MD [facility] [phone]

## 2020-05-31 DIAGNOSIS — Z6821 Body mass index (BMI) 21.0-21.9, adult: Secondary | ICD-10-CM | POA: Diagnosis not present

## 2020-05-31 DIAGNOSIS — N3000 Acute cystitis without hematuria: Secondary | ICD-10-CM | POA: Diagnosis not present

## 2020-11-29 ENCOUNTER — Other Ambulatory Visit (HOSPITAL_COMMUNITY): Payer: Self-pay | Admitting: Cardiovascular Disease

## 2020-11-29 DIAGNOSIS — I739 Peripheral vascular disease, unspecified: Secondary | ICD-10-CM

## 2020-12-21 ENCOUNTER — Ambulatory Visit (HOSPITAL_COMMUNITY)
Admission: RE | Admit: 2020-12-21 | Discharge: 2020-12-21 | Disposition: A | Discharge status: Home / Self Care | Payer: Medicare HMO | Source: Ambulatory Visit | Attending: Internal Medicine | Admitting: Internal Medicine

## 2020-12-21 ENCOUNTER — Other Ambulatory Visit: Payer: Self-pay

## 2020-12-21 ENCOUNTER — Other Ambulatory Visit (HOSPITAL_COMMUNITY): Payer: Self-pay | Admitting: Cardiovascular Disease

## 2020-12-21 DIAGNOSIS — Z95828 Presence of other vascular implants and grafts: Secondary | ICD-10-CM

## 2020-12-21 DIAGNOSIS — Z95820 Peripheral vascular angioplasty status with implants and grafts: Secondary | ICD-10-CM | POA: Insufficient documentation

## 2020-12-21 DIAGNOSIS — I739 Peripheral vascular disease, unspecified: Secondary | ICD-10-CM | POA: Diagnosis present

## 2020-12-21 DIAGNOSIS — I2510 Atherosclerotic heart disease of native coronary artery without angina pectoris: Secondary | ICD-10-CM

## 2020-12-21 DIAGNOSIS — Z9861 Coronary angioplasty status: Secondary | ICD-10-CM

## 2020-12-29 ENCOUNTER — Ambulatory Visit (INDEPENDENT_AMBULATORY_CARE_PROVIDER_SITE_OTHER): Payer: Medicare HMO | Admitting: Cardiovascular Disease

## 2020-12-29 ENCOUNTER — Other Ambulatory Visit: Payer: Self-pay

## 2020-12-29 ENCOUNTER — Encounter: Payer: Self-pay | Admitting: Cardiovascular Disease

## 2020-12-29 VITALS — BP 146/62 | HR 69 | Ht 67.0 in | Wt 140.6 lb

## 2020-12-29 DIAGNOSIS — I739 Peripheral vascular disease, unspecified: Secondary | ICD-10-CM | POA: Diagnosis not present

## 2020-12-29 DIAGNOSIS — E782 Mixed hyperlipidemia: Secondary | ICD-10-CM | POA: Diagnosis not present

## 2020-12-29 DIAGNOSIS — Z951 Presence of aortocoronary bypass graft: Secondary | ICD-10-CM | POA: Diagnosis not present

## 2020-12-29 DIAGNOSIS — I10 Essential (primary) hypertension: Secondary | ICD-10-CM

## 2020-12-29 LAB — HEPATIC FUNCTION PANEL
ALT: 29 IU/L (ref 0–44)
AST: 28 IU/L (ref 0–40)
Albumin: 4.6 g/dL (ref 3.7–4.7)
Alkaline Phosphatase: 83 IU/L (ref 44–121)
Bilirubin Total: 0.4 mg/dL (ref 0.0–1.2)
Bilirubin, Direct: 0.13 mg/dL (ref 0.00–0.40)
Total Protein: 7.1 g/dL (ref 6.0–8.5)

## 2020-12-29 LAB — LIPID PANEL
Chol/HDL Ratio: 4.4 ratio (ref 0.0–5.0)
Cholesterol, Total: 141 mg/dL (ref 100–199)
HDL: 32 mg/dL — ABNORMAL LOW (ref >39)
LDL Chol Calc (NIH): 84 mg/dL (ref 0–99)
Triglycerides: 141 mg/dL (ref 0–149)
VLDL Cholesterol Cal: 25 mg/dL (ref 5–40)

## 2020-12-29 NOTE — Assessment & Plan Note (Signed)
History of hyperlipidemia on statin therapy with lipid profile performed 12/05/2019 revealing total cholesterol 139, LDL 75 and HDL 30.  We will repeat a fasting lipid and liver profile this morning.

## 2020-12-29 NOTE — Assessment & Plan Note (Signed)
History of peripheral arterial disease status post remote right iliac stenting by myself with subsequent right to left femorofemoral crossover grafting by Dr.Hayes back in the 1990s.  Because of worsening claudication I performed angiography on him 05/08/2016 revealing a patent right to left femorofemoral crossover graft, patent right iliac stent, high-grade calcified right external iliac artery stenosis and right common femoral artery stenosis proximal to the bypass graft takeoff, occluded SFAs bilaterally with high-grade proximal left anterior tibial artery stenosis and occluded left tibioperoneal trunk.  He ultimately underwent endarterectomy and patch angioplasty of his calcified right common femoral artery as well as stenting of his iliac artery by Dr. Trula Slade.  His symptoms improved.  His Dopplers are stable.  He does complain of stable bilateral calf claudication on cilostazol.

## 2020-12-29 NOTE — Assessment & Plan Note (Signed)
History of CAD status post circumflex stenting by myself back in 2003.  Dr. Martinique performed cardiac catheterization on him 10/11/2015 revealing three-vessel disease and he ultimately underwent CABG by Dr. Roxan Hockey 10/18/2015 with a LIMA to his LAD, vein to diagonal branch, obtuse marginal branches 1 and 2 and distal RCA.  He is completely asymptomatic.

## 2020-12-29 NOTE — Assessment & Plan Note (Signed)
History of essential hypertension blood pressure measured today 146/62.  He is on amlodipine, hydrochlorothiazide and metoprolol.

## 2020-12-29 NOTE — Patient Instructions (Signed)
Medication Instructions:  Your physician recommends that you continue on your current medications as directed. Please refer to the Current Medication list given to you today.  *If you need a refill on your cardiac medications before your next appointment, please call your pharmacy*   Lab Work: Your physician recommends that you have labs drawn today: lipid/liver profile.  If you have labs (blood work) drawn today and your tests are completely normal, you will receive your results only by: [name] MyChart Message (if you have MyChart) OR . A paper copy in the mail If you have any lab test that is abnormal or we need to change your treatment, we will call you to review the results.   Testing/Procedures: Your physician has requested that you have a lower extremity arterial duplex. This test is an ultrasound of the arteries in the legs. It looks at arterial blood flow in the legs. Allow one hour for Lower Arterial scans. There are no restrictions or special instructions  Your physician has requested that you have an ankle brachial index (ABI). During this test an ultrasound and blood pressure cuff are used to evaluate the arteries that supply the arms and legs with blood. Allow thirty minutes for this exam. There are no restrictions or special instructions.  Dr. Gwenlyn Found has recommended that you have an Ultrasound of your AORTA/IVC/ILIACS.   To prepare for this test:  . No food after 11PM the night before. Water is OK. (Don't drink liquids if you have been instructed not to for ANOTHER test).  . Avoid foods that produce bowel gas, for 24 hours prior to exam (see below). . No breakfast, no chewing gum, no smoking or carbonated beverages. . Patient may take morning medications with water. . Come in for test at least 15 minutes early to register.  These procedures are done at [facility]. To be done May 2023  Follow-Up: At [facility], you and your health needs are our priority.  As part  of our continuing mission to provide you with exceptional heart care, we have created designated Provider Care Teams.  These Care Teams include your primary Cardiologist (physician) and Advanced Practice Providers (APPs -  Physician Assistants and Nurse Practitioners) who all work together to provide you with the care you need, when you need it.  We recommend signing up for the patient portal called "MyChart".  Sign up information is provided on this After Visit Summary.  MyChart is used to connect with patients for Virtual Visits (Telemedicine).  Patients are able to view lab/test results, encounter notes, upcoming appointments, etc.  Non-urgent messages can be sent to your provider as well.   To learn more about what you can do with MyChart, go to [email or web address].    Your next appointment:   12 month(s)  The format for your next appointment:   In Person  Provider:   Quay Burow, MD

## 2020-12-29 NOTE — Progress Notes (Signed)
12/29/2020 [name]   Dec 27, 1945  [id number]  Primary Physician [name], MD Primary Cardiologist: Lorretta Harp MD FACP, [place], [place], [place]  HPI:  [name] is a 75 y.o.  married Caucasian male father of 2 children who I took care of for years and last saw in the office  12/05/2019.  He has a history of CAD status post circumflex stenting by myself in 2003. He also has a history of peripheral vascular disease status post grafting of his lower extremities by Dr. Amedeo Plenty in the 90s. He had right to left femorofemoral crossover grafting. I performed stenting of his right lower extremity multiple times. He stopped smoking remotely. His cardiovascular risk factor profile includes treated hypertension, hyperlipidemia and type 2 diabetes. He was admitted in February with unstable angina and underwent cardiac catheterization by Dr. Martinique 10/11/15 revealing three-vessel disease. He ultimately underwent coronary artery bypass grafting on 10/18/15 by Dr. Roxan Hockey is a LIMA to his LAD, vein to diagonal branch, obtuse marginal branches 1 and 2 and the distal RCA. His postop course was unremarkable. He recently had Doppler studies revealing an occluded left iliac with high-grade right external iliac artery stenosis, occluded SFAs bilaterally with occluded left femoropopliteal bypass graft and a patent right to left femorofemoral crossover graft.. His right ABI was 0.6 by the left was 0.88. He was more symptomatic on the right and does have lifestyle limiting claudication. He underwent peripheral angiography by myself 05/08/16 revealing a patent right to left femorofemoral crossover graft, patent right iliac stent, occluded SFAs bilaterally with a high-grade 95% calcified right common femoral stenosis just proximal to the femorofemoral anastomosis. He also underwent right iliofemoral endarterectomy by Dr. Trula Slade 08/09/16 however his postprocedure Dopplers actually showed worsening of his claudication is  worse as well. Heunderwent peripheral angiography by Dr. Trula Slade 09/19/16 revealing a high-grade right external iliac artery stenosis which she ultimately stented. This resulted in marked improvement in his claudication symptoms.  He was readmitted in June with pneumonia and palpitations. He did see Almyra Deforest PA-C in the office cause of this and he was somewhat hypotensive. He was readmitted to the hospital because of sepsis and this was appropriately treated. He saw Almyra Deforest PA-C in the office 01/31/17 after being treated for sepsis as blood pressure had improved and his palpitations had resolved.  Since I saw him in the office a year ago he remained stable.  He denies chest pain or shortness of breath.  He does have stable bilateral calf claudication.  His recent Doppler studies performed 12/21/2020 revealed a patent right to left femorofemoral crossover graft and patent right iliac stent.    Current Meds  Medication Sig  . acetaminophen (TYLENOL) 325 MG tablet Take 2 tablets (650 mg total) by mouth every 6 (six) hours as needed for mild pain, fever or headache (or Fever >/= 101).  [name] albuterol (VENTOLIN HFA) 108 (90 Base) MCG/ACT inhaler Inhale 2 puffs into the lungs every 6 (six) hours as needed for wheezing or shortness of breath.  [name] amLODipine (NORVASC) 10 MG tablet Take 1 tablet (10 mg total) by mouth daily.  [name] aspirin EC 81 MG tablet Take 1 tablet (81 mg total) by mouth daily with breakfast.  . atorvastatin (LIPITOR) 40 MG tablet Take 1 tablet (40 mg total) by mouth daily at 6 PM.  . cilostazol (PLETAL) 100 MG tablet Take 100 mg by mouth 2 (two) times daily.  . clopidogrel (PLAVIX) 75 MG tablet Take 75  mg by mouth daily.  . empagliflozin (JARDIANCE) 25 MG TABS tablet Take 12.5 mg by mouth daily.   . ferrous sulfate 325 (65 FE) MG tablet Take 325 mg by mouth 3 (three) times daily.   [name] gabapentin (NEURONTIN) 600 MG tablet Take 600 mg by mouth 3 (three) times daily.   . hydrochlorothiazide  (HYDRODIURIL) 12.5 MG tablet Take 1 tablet (12.5 mg total) by mouth daily.  . insulin glargine (LANTUS) 100 UNIT/ML injection Inject 20 Units into the skin at bedtime.  . metFORMIN (GLUCOPHAGE) 1000 MG tablet Take 1,000 mg by mouth 2 (two) times daily with a meal.   . metoprolol tartrate (LOPRESSOR) 50 MG tablet Take 25 mg by mouth 2 (two) times daily.   . pantoprazole (PROTONIX) 40 MG tablet Take 1 tablet (40 mg total) by mouth daily.  . Semaglutide,0.25 or 0.5MG /DOS, (OZEMPIC, 0.25 OR 0.5 MG/DOSE,) 2 MG/1.5ML SOPN Inject 0.25 mg into the skin every Sunday. *Takes in the Evening**OZEMPIC     No Known Allergies  Social History   Socioeconomic History  . Marital status: Married    Spouse name: Not on file  . Number of children: Not on file  . Years of education: Not on file  . Highest education level: Not on file  Occupational History  . Not on file  Tobacco Use  . Smoking status: Former Smoker    Packs/day: 2.00    Years: 30.00    Pack years: 60.00    Types: Cigarettes    Quit date: 08/14/2000    Years since quitting: 20.3  . Smokeless tobacco: Never Used  Vaping Use  . Vaping Use: Never used  Substance and Sexual Activity  . Alcohol use: No  . Drug use: No  . Sexual activity: Not on file  Other Topics Concern  . Not on file  Social History Narrative  . Not on file   Social Determinants of Health   Financial Resource Strain: Not on file  Food Insecurity: Not on file  Transportation Needs: Not on file  Physical Activity: Not on file  Stress: Not on file  Social Connections: Not on file  Intimate Partner Violence: Not on file     Review of Systems: General: negative for chills, fever, night sweats or weight changes.  Cardiovascular: negative for chest pain, dyspnea on exertion, edema, orthopnea, palpitations, paroxysmal nocturnal dyspnea or shortness of breath Dermatological: negative for rash Respiratory: negative for cough or wheezing Urologic: negative for  hematuria Abdominal: negative for nausea, vomiting, diarrhea, bright red blood per rectum, melena, or hematemesis Neurologic: negative for visual changes, syncope, or dizziness All other systems reviewed and are otherwise negative except as noted above.    Blood pressure (!) 146/62, pulse 69, height 5\' 7"  (1.702 m), weight 140 lb 9.6 oz (63.8 kg), SpO2 98 %.  General appearance: alert and no distress Neck: no adenopathy, no carotid bruit, no JVD, supple, symmetrical, trachea midline and thyroid not enlarged, symmetric, no tenderness/mass/nodules Lungs: clear to auscultation bilaterally Heart: regular rate and rhythm, S1, S2 normal, no murmur, click, rub or gallop Extremities: extremities normal, atraumatic, no cyanosis or edema Pulses: Diminished pedal pulses bilaterally Skin: Skin color, texture, turgor normal. No rashes or lesions Neurologic: Alert and oriented X 3, normal strength and tone. Normal symmetric reflexes. Normal coordination and gait  EKG sinus rhythm at 69 with first-degree AV block.  I personally reviewed this EKG.  He did have Q's in lead III and F.  ASSESSMENT AND PLAN:  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 History of CAD status post circumflex stenting by myself back in 2003.  Dr. Martinique performed cardiac catheterization on him 10/11/2015 revealing three-vessel disease and he ultimately underwent CABG by Dr. Roxan Hockey 10/18/2015 with a LIMA to his LAD, vein to diagonal branch, obtuse marginal branches 1 and 2 and distal RCA.  He is completely asymptomatic.  PVD (peripheral vascular disease) ([place]) History of peripheral arterial disease status post remote right iliac stenting by myself with subsequent right to left femorofemoral crossover grafting by Dr.Hayes back in the 1990s.  Because of worsening claudication I performed angiography on him 05/08/2016 revealing a patent right to left femorofemoral crossover graft, patent right iliac stent, high-grade calcified right external iliac  artery stenosis and right common femoral artery stenosis proximal to the bypass graft takeoff, occluded SFAs bilaterally with high-grade proximal left anterior tibial artery stenosis and occluded left tibioperoneal trunk.  He ultimately underwent endarterectomy and patch angioplasty of his calcified right common femoral artery as well as stenting of his iliac artery by Dr. Trula Slade.  His symptoms improved.  His Dopplers are stable.  He does complain of stable bilateral calf claudication on cilostazol.  Essential hypertension History of essential hypertension blood pressure measured today 146/62.  He is on amlodipine, hydrochlorothiazide and metoprolol.  HLD (hyperlipidemia) History of hyperlipidemia on statin therapy with lipid profile performed 12/05/2019 revealing total cholesterol 139, LDL 75 and HDL 30.  We will repeat a fasting lipid and liver profile this morning.      Lorretta Harp MD FACP,FACC,FAHA, [facility] 12/29/2020 10:10 AM

## 2020-12-30 ENCOUNTER — Other Ambulatory Visit: Payer: Self-pay | Admitting: *Deleted

## 2020-12-30 DIAGNOSIS — R911 Solitary pulmonary nodule: Secondary | ICD-10-CM

## 2021-01-12 ENCOUNTER — Other Ambulatory Visit: Payer: Self-pay

## 2021-01-12 ENCOUNTER — Ambulatory Visit
Admission: RE | Admit: 2021-01-12 | Discharge: 2021-01-12 | Disposition: A | Discharge status: Home / Self Care | Payer: Medicare HMO | Source: Ambulatory Visit | Attending: Thoracic Surgery (Cardiothoracic Vascular Surgery) | Admitting: Thoracic Surgery (Cardiothoracic Vascular Surgery)

## 2021-01-12 DIAGNOSIS — R911 Solitary pulmonary nodule: Secondary | ICD-10-CM

## 2021-01-25 ENCOUNTER — Ambulatory Visit: Payer: Medicare HMO | Admitting: Thoracic Surgery (Cardiothoracic Vascular Surgery)

## 2021-02-08 ENCOUNTER — Encounter: Payer: Self-pay | Admitting: Thoracic Surgery (Cardiothoracic Vascular Surgery)

## 2021-02-08 ENCOUNTER — Other Ambulatory Visit: Payer: Self-pay | Admitting: *Deleted

## 2021-02-08 ENCOUNTER — Other Ambulatory Visit: Payer: Self-pay

## 2021-02-08 ENCOUNTER — Ambulatory Visit: Payer: Medicare HMO | Admitting: Thoracic Surgery (Cardiothoracic Vascular Surgery)

## 2021-02-08 VITALS — BP 150/69 | HR 76 | Resp 20 | Ht 67.0 in | Wt 140.0 lb

## 2021-02-08 DIAGNOSIS — M899 Disorder of bone, unspecified: Secondary | ICD-10-CM

## 2021-02-08 DIAGNOSIS — R911 Solitary pulmonary nodule: Secondary | ICD-10-CM

## 2021-02-08 NOTE — Progress Notes (Signed)
[address]       [place] [address]             [phone]     HPI: [name] returns for a scheduled follow-up visit  [name] is a 75 year old man with a history of tobacco abuse, lung nodules, atherosclerotic cardiovascular disease, coronary bypass grafting, and stage III chronic kidney disease.  I did coronary bypass grafting in March 2017.  On his preoperative chest x-ray and CT he had a cavitary right upper lobe lung nodule.  A follow-up CT showed interval resolution but he had multiple small nodules and groundglass opacities bilaterally.  He has been followed since then.  I last saw him in June 2021.  There was a new 4 mm subpleural nodule in the posterior left upper lobe.  He says he has been doing well over the past year.  He is not having any chest pain, pressure, tightness, or shortness of breath.  He quit smoking 20 years ago.  He is not having any respiratory issues.  Past Medical History:  Diagnosis Date   CAD S/P percutaneous coronary angioplasty 1998   PCI TO CX   CKD (chronic kidney disease) stage 3, GFR 30-59 ml/min (HCC)    Diabetes mellitus without complication ([name])    Diverticulitis 08/16/2017   hospitalized with diverticulitis/sepsis   GERD (gastroesophageal reflux disease)    occassionally   Hx of CABG March 2017   x 5   Hypercholesteremia    Hypertension    Neuropathy    Peripheral vascular disease ([name])    s/p R-L FEM-FEM BYPASS   Pneumonia    Tachycardia    after CABG, pt on medicine for this    Current Outpatient Medications  Medication Sig Dispense Refill   acetaminophen (TYLENOL) 325 MG tablet Take 2 tablets (650 mg total) by mouth every 6 (six) hours as needed for mild pain, fever or headache (or Fever >/= 101). 30 tablet 0   albuterol (VENTOLIN HFA) 108 (90 Base) MCG/ACT inhaler Inhale 2 puffs into the lungs every 6 (six) hours as needed for wheezing or shortness of breath. 18 g 3   amLODipine (NORVASC) 10 MG  tablet Take 1 tablet (10 mg total) by mouth daily. 30 tablet 0   aspirin EC 81 MG tablet Take 1 tablet (81 mg total) by mouth daily with breakfast. 30 tablet 2   atorvastatin (LIPITOR) 40 MG tablet Take 1 tablet (40 mg total) by mouth daily at 6 PM. 30 tablet 1   cilostazol (PLETAL) 100 MG tablet Take 100 mg by mouth 2 (two) times daily.     clopidogrel (PLAVIX) 75 MG tablet Take 75 mg by mouth daily.     empagliflozin (JARDIANCE) 25 MG TABS tablet Take 12.5 mg by mouth daily.      ferrous sulfate 325 (65 FE) MG tablet Take 325 mg by mouth 3 (three) times daily.      gabapentin (NEURONTIN) 600 MG tablet Take 600 mg by mouth 3 (three) times daily.      hydrochlorothiazide (HYDRODIURIL) 12.5 MG tablet Take 1 tablet (12.5 mg total) by mouth daily. 30 tablet 1   insulin glargine (LANTUS) 100 UNIT/ML injection Inject 20 Units into the skin at bedtime.     metFORMIN (GLUCOPHAGE) 1000 MG tablet Take 1,000 mg by mouth 2 (two) times daily with a meal.      metoprolol tartrate (LOPRESSOR) 50 MG tablet Take 25 mg by mouth 2 (two)  times daily.      pantoprazole (PROTONIX) 40 MG tablet Take 1 tablet (40 mg total) by mouth daily. 5 tablet 0   Semaglutide,0.25 or 0.5MG /DOS, (OZEMPIC, 0.25 OR 0.5 MG/DOSE,) 2 MG/1.5ML SOPN Inject 0.25 mg into the skin every Sunday. *Takes in the Evening**OZEMPIC     No current facility-administered medications for this visit.    Physical Exam BP (!) 150/69   Pulse 76   Resp 20   Ht 5\' 7"  (1.702 m)   Wt 140 lb (63.5 kg)   SpO2 95% Comment: RA  BMI 21.88 kg/m  75 year old man in no acute distress Alert and oriented x3 with no focal deficits Lungs clear with equal breath sounds bilaterally Cardiac regular rate and rhythm No peripheral edema  Diagnostic Tests: CT CHEST WITHOUT CONTRAST   TECHNIQUE: Multidetector CT imaging of the chest was performed following the standard protocol without IV contrast.   COMPARISON:  CT 01/15/2020   FINDINGS: Cardiovascular:  Post CABG.  No pericardial effusion.   Mediastinum/Nodes: No axillary or supraclavicular adenopathy. No mediastinal or hilar adenopathy. No pericardial fluid. Esophagus normal.   Lungs/Pleura: Nodule of concern in the LEFT upper lobe along the fissure is less conspicuous and difficult to measure (approximately 2 mm on image 35/5 compared to 4 mm).   Stable 3 mm RIGHT upper lobe nodule on image 39/5.   Improvement ground-glass opacities in the lower lobes described on comparison exam.   Within the RIGHT upper lobe there is new segmental ground-glass opacity and a peripheral band of pleural-parenchymal thickening towards the apex (images 55 through 41 of series 5.) this segmental pattern favors an infectious or inflammatory process.   Upper Abdomen: Limited view of the liver, kidneys, pancreas are unremarkable. Normal adrenal glands.   Dense ostial calcification of the SMA and celiac trunk.   Musculoskeletal: Within the superior aspect of T9 vertebral body interval enlargement of a partially sclerotic measuring 12 mm (96/2). This lesion has a thin sclerotic rim and appears to be associated endplate. Lesion has increased in size from 5 mm on CT 01/15/2020.   IMPRESSION: 1. Nodule of concern in LEFT upper lobe along the fissure is decreased conspicuity consistent with benign nodule. 2. New segmental region of ground-glass density and pleuroparenchymal thickening in the RIGHT upper lobe is favored a post infectious or inflammatory process. 3. Enlargement of partially sclerotic lesion in the T9 vertebral body. In the absence of known malignancy, this may represent evolution of a Schmorl's node or cystic degenerative change. Further evaluation with contrast MRI midthoracic spine would be warranted if concern for malignancy.     Electronically Signed   By: Suzy Bouchard M.D.   On: 01/12/2021 09:21  I personally reviewed the CT images.  The left upper lobe nodule has  improved but there is a new opacity in the right upper lobe.  That most likely is infectious or inflammatory.  Concur with the finding of a sclerotic lesion in T9  Impression: [name] is a 75 year old man with a history of tobacco abuse, hypertension, lung nodules, atherosclerotic cardiovascular disease, coronary bypass grafting, and stage III chronic kidney disease.  Lung nodules-subpleural nodule in posterior left upper lobe slightly smaller.  New groundglass opacity right upper lobe.  We will plan repeat CT in 6 months.  Hypertension-blood pressure elevated today.  It was at his last visit a year ago.  He does check it and it has been normal at other physician's offices.  CAD-status post CABG in 2017.  No anginal symptoms.  Plan: MRI thoracic spine to evaluate sclerotic lesion in T9 vertebra Return in 6 months with CT chest to reevaluate right upper lobe opacity  Anthony Nakayama, MD [facility] [phone]

## 2021-02-10 ENCOUNTER — Ambulatory Visit: Payer: Medicare HMO | Admitting: Thoracic Surgery (Cardiothoracic Vascular Surgery)

## 2021-02-15 ENCOUNTER — Ambulatory Visit: Payer: Medicare HMO | Admitting: Thoracic Surgery (Cardiothoracic Vascular Surgery)

## 2021-02-21 ENCOUNTER — Ambulatory Visit
Admission: RE | Admit: 2021-02-21 | Discharge: 2021-02-21 | Disposition: A | Discharge status: Home / Self Care | Payer: Medicare HMO | Source: Ambulatory Visit | Attending: Thoracic Surgery (Cardiothoracic Vascular Surgery) | Admitting: Thoracic Surgery (Cardiothoracic Vascular Surgery)

## 2021-02-21 ENCOUNTER — Other Ambulatory Visit: Payer: Self-pay

## 2021-02-21 DIAGNOSIS — M899 Disorder of bone, unspecified: Secondary | ICD-10-CM

## 2021-02-21 MED ORDER — GADOBENATE DIMEGLUMINE 529 MG/ML IV SOLN
13.0000 mL | Freq: Once | INTRAVENOUS | Status: AC | PRN
  Administered 2021-02-21: 13 mL via INTRAVENOUS

## 2021-02-23 ENCOUNTER — Other Ambulatory Visit: Payer: Medicare HMO

## 2021-04-01 ENCOUNTER — Emergency Department (HOSPITAL_COMMUNITY): Payer: No Typology Code available for payment source

## 2021-04-01 ENCOUNTER — Other Ambulatory Visit: Payer: Self-pay

## 2021-04-01 ENCOUNTER — Inpatient Hospital Stay (HOSPITAL_COMMUNITY)
Admission: EM | Admit: 2021-04-01 | Discharge: 2021-04-06 | DRG: 482 | Disposition: A | Discharge status: Home Health | Payer: No Typology Code available for payment source | Attending: Family Medicine | Admitting: Family Medicine

## 2021-04-01 ENCOUNTER — Encounter (HOSPITAL_COMMUNITY): Payer: Self-pay | Admitting: Student

## 2021-04-01 DIAGNOSIS — E1159 Type 2 diabetes mellitus with other circulatory complications: Secondary | ICD-10-CM | POA: Diagnosis not present

## 2021-04-01 DIAGNOSIS — I152 Hypertension secondary to endocrine disorders: Secondary | ICD-10-CM | POA: Diagnosis not present

## 2021-04-01 DIAGNOSIS — Z20822 Contact with and (suspected) exposure to covid-19: Secondary | ICD-10-CM | POA: Diagnosis present

## 2021-04-01 DIAGNOSIS — I951 Orthostatic hypotension: Secondary | ICD-10-CM | POA: Diagnosis present

## 2021-04-01 DIAGNOSIS — Z800 Family history of malignant neoplasm of digestive organs: Secondary | ICD-10-CM

## 2021-04-01 DIAGNOSIS — Z7902 Long term (current) use of antithrombotics/antiplatelets: Secondary | ICD-10-CM

## 2021-04-01 DIAGNOSIS — Z79899 Other long term (current) drug therapy: Secondary | ICD-10-CM

## 2021-04-01 DIAGNOSIS — Z7984 Long term (current) use of oral hypoglycemic drugs: Secondary | ICD-10-CM | POA: Diagnosis not present

## 2021-04-01 DIAGNOSIS — Z419 Encounter for procedure for purposes other than remedying health state, unspecified: Secondary | ICD-10-CM

## 2021-04-01 DIAGNOSIS — S72002A Fracture of unspecified part of neck of left femur, initial encounter for closed fracture: Secondary | ICD-10-CM | POA: Diagnosis not present

## 2021-04-01 DIAGNOSIS — S72145A Nondisplaced intertrochanteric fracture of left femur, initial encounter for closed fracture: Secondary | ICD-10-CM | POA: Diagnosis present

## 2021-04-01 DIAGNOSIS — I129 Hypertensive chronic kidney disease with stage 1 through stage 4 chronic kidney disease, or unspecified chronic kidney disease: Secondary | ICD-10-CM | POA: Diagnosis present

## 2021-04-01 DIAGNOSIS — E1151 Type 2 diabetes mellitus with diabetic peripheral angiopathy without gangrene: Secondary | ICD-10-CM | POA: Diagnosis present

## 2021-04-01 DIAGNOSIS — Z87891 Personal history of nicotine dependence: Secondary | ICD-10-CM | POA: Diagnosis not present

## 2021-04-01 DIAGNOSIS — N183 Chronic kidney disease, stage 3 unspecified: Secondary | ICD-10-CM | POA: Diagnosis present

## 2021-04-01 DIAGNOSIS — W19XXXA Unspecified fall, initial encounter: Secondary | ICD-10-CM

## 2021-04-01 DIAGNOSIS — Z01810 Encounter for preprocedural cardiovascular examination: Secondary | ICD-10-CM | POA: Diagnosis not present

## 2021-04-01 DIAGNOSIS — Z9049 Acquired absence of other specified parts of digestive tract: Secondary | ICD-10-CM

## 2021-04-01 DIAGNOSIS — I739 Peripheral vascular disease, unspecified: Secondary | ICD-10-CM | POA: Diagnosis not present

## 2021-04-01 DIAGNOSIS — E1122 Type 2 diabetes mellitus with diabetic chronic kidney disease: Secondary | ICD-10-CM | POA: Diagnosis present

## 2021-04-01 DIAGNOSIS — Z7982 Long term (current) use of aspirin: Secondary | ICD-10-CM

## 2021-04-01 DIAGNOSIS — K219 Gastro-esophageal reflux disease without esophagitis: Secondary | ICD-10-CM | POA: Diagnosis present

## 2021-04-01 DIAGNOSIS — E7800 Pure hypercholesterolemia, unspecified: Secondary | ICD-10-CM | POA: Diagnosis present

## 2021-04-01 DIAGNOSIS — M25552 Pain in left hip: Secondary | ICD-10-CM | POA: Diagnosis present

## 2021-04-01 DIAGNOSIS — I25118 Atherosclerotic heart disease of native coronary artery with other forms of angina pectoris: Secondary | ICD-10-CM | POA: Diagnosis not present

## 2021-04-01 DIAGNOSIS — Z955 Presence of coronary angioplasty implant and graft: Secondary | ICD-10-CM | POA: Diagnosis not present

## 2021-04-01 DIAGNOSIS — Y92000 Kitchen of unspecified non-institutional (private) residence as  the place of occurrence of the external cause: Secondary | ICD-10-CM | POA: Diagnosis not present

## 2021-04-01 DIAGNOSIS — Z794 Long term (current) use of insulin: Secondary | ICD-10-CM | POA: Diagnosis not present

## 2021-04-01 DIAGNOSIS — I2510 Atherosclerotic heart disease of native coronary artery without angina pectoris: Secondary | ICD-10-CM | POA: Diagnosis present

## 2021-04-01 DIAGNOSIS — E1140 Type 2 diabetes mellitus with diabetic neuropathy, unspecified: Secondary | ICD-10-CM | POA: Diagnosis present

## 2021-04-01 DIAGNOSIS — Z951 Presence of aortocoronary bypass graft: Secondary | ICD-10-CM

## 2021-04-01 DIAGNOSIS — E782 Mixed hyperlipidemia: Secondary | ICD-10-CM | POA: Diagnosis not present

## 2021-04-01 DIAGNOSIS — W010XXA Fall on same level from slipping, tripping and stumbling without subsequent striking against object, initial encounter: Secondary | ICD-10-CM | POA: Diagnosis present

## 2021-04-01 DIAGNOSIS — E860 Dehydration: Secondary | ICD-10-CM | POA: Diagnosis present

## 2021-04-01 LAB — BASIC METABOLIC PANEL
Anion gap: 17 — ABNORMAL HIGH (ref 5–15)
BUN: 16 mg/dL (ref 8–23)
CO2: 20 mmol/L — ABNORMAL LOW (ref 22–32)
Calcium: 8.5 mg/dL — ABNORMAL LOW (ref 8.9–10.3)
Chloride: 99 mmol/L (ref 98–111)
Creatinine, Ser: 1.24 mg/dL (ref 0.61–1.24)
GFR, Estimated: >60 mL/min (ref >60)
Glucose, Bld: 160 mg/dL — ABNORMAL HIGH (ref 70–99)
Potassium: 4 mmol/L (ref 3.5–5.1)
Sodium: 136 mmol/L (ref 135–145)

## 2021-04-01 LAB — CBC WITH DIFFERENTIAL/PLATELET
Abs Immature Granulocytes: 0.13 10*3/uL — ABNORMAL HIGH (ref 0.00–0.07)
Basophils Absolute: 0.1 10*3/uL (ref 0.0–0.1)
Basophils Relative: 1 %
Eosinophils Absolute: 0.1 10*3/uL (ref 0.0–0.5)
Eosinophils Relative: 1 %
HCT: 41.1 % (ref 39.0–52.0)
Hemoglobin: 13.1 g/dL (ref 13.0–17.0)
Immature Granulocytes: 1 %
Lymphocytes Relative: 7 %
Lymphs Abs: 0.9 10*3/uL (ref 0.7–4.0)
MCH: 29.3 pg (ref 26.0–34.0)
MCHC: 31.9 g/dL (ref 30.0–36.0)
MCV: 91.9 fL (ref 80.0–100.0)
Monocytes Absolute: 1 10*3/uL (ref 0.1–1.0)
Monocytes Relative: 7 %
Neutro Abs: 11.2 10*3/uL — ABNORMAL HIGH (ref 1.7–7.7)
Neutrophils Relative %: 83 %
Platelets: 325 10*3/uL (ref 150–400)
RBC: 4.47 MIL/uL (ref 4.22–5.81)
RDW: 15 % (ref 11.5–15.5)
WBC: 13.4 10*3/uL — ABNORMAL HIGH (ref 4.0–10.5)
nRBC: 0 % (ref 0.0–0.2)

## 2021-04-01 LAB — RESP PANEL BY RT-PCR (FLU A&B, COVID) ARPGX2
Influenza A by PCR: NEGATIVE
Influenza B by PCR: NEGATIVE
SARS Coronavirus 2 by RT PCR: NEGATIVE

## 2021-04-01 LAB — PROTIME-INR
INR: 1 (ref 0.8–1.2)
Prothrombin Time: 13.5 seconds (ref 11.4–15.2)

## 2021-04-01 LAB — HEMOGLOBIN A1C
Hgb A1c MFr Bld: 6.9 % — ABNORMAL HIGH (ref 4.8–5.6)
Mean Plasma Glucose: 151.33 mg/dL

## 2021-04-01 LAB — GLUCOSE, CAPILLARY: Glucose-Capillary: 131 mg/dL — ABNORMAL HIGH (ref 70–99)

## 2021-04-01 MED ORDER — FERROUS SULFATE 325 (65 FE) MG PO TABS
325.0000 mg | ORAL_TABLET | Freq: Three times a day (TID) | ORAL | Status: DC
  Administered 2021-04-01 – 2021-04-06 (×13): 325 mg via ORAL
  Filled 2021-04-01 (×13): qty 1

## 2021-04-01 MED ORDER — ONDANSETRON HCL 4 MG/2ML IJ SOLN: 4.0000 mg | Freq: Four times a day (QID) | INTRAMUSCULAR | Status: DC | PRN

## 2021-04-01 MED ORDER — HYDROCHLOROTHIAZIDE 25 MG PO TABS
12.5000 mg | ORAL_TABLET | Freq: Every day | ORAL | Status: DC
  Administered 2021-04-01 – 2021-04-05 (×4): 12.5 mg via ORAL
  Filled 2021-04-01 (×4): qty 1

## 2021-04-01 MED ORDER — ACETAMINOPHEN 325 MG PO TABS
650.0000 mg | ORAL_TABLET | Freq: Four times a day (QID) | ORAL | Status: DC | PRN
  Administered 2021-04-01: 650 mg via ORAL
  Filled 2021-04-01: qty 2

## 2021-04-01 MED ORDER — HYDROMORPHONE HCL 1 MG/ML IJ SOLN
0.5000 mg | Freq: Once | INTRAMUSCULAR | Status: AC
  Administered 2021-04-01: 0.5 mg via INTRAVENOUS
  Filled 2021-04-01: qty 1

## 2021-04-01 MED ORDER — METOPROLOL TARTRATE 25 MG PO TABS
25.0000 mg | ORAL_TABLET | Freq: Two times a day (BID) | ORAL | Status: DC
  Administered 2021-04-01 – 2021-04-06 (×9): 25 mg via ORAL
  Filled 2021-04-01 (×5): qty 1
  Filled 2021-04-01: qty 2
  Filled 2021-04-01 (×4): qty 1

## 2021-04-01 MED ORDER — ALBUTEROL SULFATE (2.5 MG/3ML) 0.083% IN NEBU: 3.0000 mL | INHALATION_SOLUTION | Freq: Four times a day (QID) | RESPIRATORY_TRACT | Status: DC | PRN

## 2021-04-01 MED ORDER — MORPHINE SULFATE (PF) 4 MG/ML IV SOLN
4.0000 mg | INTRAVENOUS | Status: DC | PRN
  Administered 2021-04-01 – 2021-04-02 (×3): 4 mg via INTRAVENOUS
  Filled 2021-04-01 (×3): qty 1

## 2021-04-01 MED ORDER — GABAPENTIN 600 MG PO TABS
600.0000 mg | ORAL_TABLET | Freq: Three times a day (TID) | ORAL | Status: DC
  Administered 2021-04-01 – 2021-04-06 (×13): 600 mg via ORAL
  Filled 2021-04-01 (×16): qty 1

## 2021-04-01 MED ORDER — LOSARTAN POTASSIUM 50 MG PO TABS
50.0000 mg | ORAL_TABLET | Freq: Every day | ORAL | Status: DC
  Administered 2021-04-01 – 2021-04-06 (×5): 50 mg via ORAL
  Filled 2021-04-01 (×5): qty 1

## 2021-04-01 MED ORDER — AMLODIPINE BESYLATE 10 MG PO TABS
10.0000 mg | ORAL_TABLET | Freq: Every day | ORAL | Status: DC
  Administered 2021-04-01 – 2021-04-05 (×4): 10 mg via ORAL
  Filled 2021-04-01 (×2): qty 2
  Filled 2021-04-01 (×2): qty 1

## 2021-04-01 MED ORDER — METFORMIN HCL 500 MG PO TABS: 1000.0000 mg | ORAL_TABLET | Freq: Two times a day (BID) | ORAL | Status: DC

## 2021-04-01 MED ORDER — HYDROMORPHONE HCL 1 MG/ML IJ SOLN
1.0000 mg | Freq: Once | INTRAMUSCULAR | Status: AC
Start: 2021-04-01 — End: 2021-04-01
  Administered 2021-04-01: 1 mg via INTRAVENOUS
  Filled 2021-04-01: qty 1

## 2021-04-01 MED ORDER — ATORVASTATIN CALCIUM 40 MG PO TABS
40.0000 mg | ORAL_TABLET | Freq: Every day | ORAL | Status: DC
  Administered 2021-04-02 – 2021-04-05 (×4): 40 mg via ORAL
  Filled 2021-04-01 (×4): qty 1

## 2021-04-01 MED ORDER — INSULIN GLARGINE-YFGN 100 UNIT/ML ~~LOC~~ SOLN
10.0000 [IU] | Freq: Every day | SUBCUTANEOUS | Status: DC
  Administered 2021-04-01 – 2021-04-05 (×5): 10 [IU] via SUBCUTANEOUS
  Filled 2021-04-01 (×7): qty 0.1

## 2021-04-01 MED ORDER — INSULIN ASPART 100 UNIT/ML IJ SOLN
0.0000 [IU] | Freq: Three times a day (TID) | INTRAMUSCULAR | Status: DC
  Administered 2021-04-02: 2 [IU] via SUBCUTANEOUS
  Administered 2021-04-03: 5 [IU] via SUBCUTANEOUS
  Administered 2021-04-03: 3 [IU] via SUBCUTANEOUS
  Administered 2021-04-04: 2 [IU] via SUBCUTANEOUS
  Administered 2021-04-04 – 2021-04-06 (×6): 3 [IU] via SUBCUTANEOUS

## 2021-04-01 MED ORDER — PANTOPRAZOLE SODIUM 40 MG PO TBEC
40.0000 mg | DELAYED_RELEASE_TABLET | Freq: Every day | ORAL | Status: DC
  Administered 2021-04-01 – 2021-04-02 (×2): 40 mg via ORAL
  Filled 2021-04-01: qty 2
  Filled 2021-04-01 (×2): qty 1

## 2021-04-01 NOTE — ED Triage Notes (Signed)
Pt arrives from home by [facility] after having a mechanical fall. Pt states he lost his balance in kitchen slipped and fell onto his left hip and mainly complaining of pain in his left hip/ buttock area. Pt denies hitting his head. Denies LOC. EMS did report patient patient is on Eliquis, but patient is currently not sure if he is still on it. Pt does have hx of PVD, multiple stents placed and CABG hx. Pt is Aox4. No complaints of pain elsewhere. No SoB, chest pain, abd pain. No shortening or rotation noted. EMS gave 10 mg of morphine and 75 mcg of fentanyl PTA. All VSS.

## 2021-04-01 NOTE — Plan of Care (Signed)

## 2021-04-01 NOTE — ED Provider Notes (Signed)
  Received signout from the beginning of shift, please see previous providers note for complete H&P.  This is a 76 year old male significant history of CAD status post CABG currently on Eliquis, history of diabetes, who had a mechanical fall earlier today when he was turning around to pick up his medication and fell to the ground.  He injured his left hip, x-ray demonstrated a nondisplaced left inferior trochanteric fracture.  This is a closed injury.  No other significant injury were noted.  Orthopedist, Dr. Ninfa Linden have been consulted and has seen and evaluated patient.  Plan to operate patient on Sunday.  Will consult medicine for admission.  4:35 PM Appreciate consultation from family medicine resident who agrees to see and will admit patient for further management.  BP 134/61   Pulse 89   Temp 99.1 F (37.3 C) (Oral)   Resp 20   Ht '5\' 7"'$  (1.702 m)   Wt 64.4 kg   SpO2 96%   BMI 22.24 kg/m   Results for orders placed or performed during the hospital encounter of [id number]  Basic metabolic panel  Result Value Ref Range   Sodium 136 135 - 145 mmol/L   Potassium 4.0 3.5 - 5.1 mmol/L   Chloride 99 98 - 111 mmol/L   CO2 20 (L) 22 - 32 mmol/L   Glucose, Bld 160 (H) 70 - 99 mg/dL   BUN 16 8 - 23 mg/dL   Creatinine, Ser 1.24 0.61 - 1.24 mg/dL   Calcium 8.5 (L) 8.9 - 10.3 mg/dL   GFR, Estimated >60 >60 mL/min   Anion gap 17 (H) 5 - 15  CBC WITH DIFFERENTIAL  Result Value Ref Range   WBC 13.4 (H) 4.0 - 10.5 K/uL   RBC 4.47 4.22 - 5.81 MIL/uL   Hemoglobin 13.1 13.0 - 17.0 g/dL   HCT 41.1 39.0 - 52.0 %   MCV 91.9 80.0 - 100.0 fL   MCH 29.3 26.0 - 34.0 pg   MCHC 31.9 30.0 - 36.0 g/dL   RDW 15.0 11.5 - 15.5 %   Platelets 325 150 - 400 K/uL   nRBC 0.0 0.0 - 0.2 %   Neutrophils Relative % 83 %   Neutro Abs 11.2 (H) 1.7 - 7.7 K/uL   Lymphocytes Relative 7 %   Lymphs Abs 0.9 0.7 - 4.0 K/uL   Monocytes Relative 7 %   Monocytes Absolute 1.0 0.1 - 1.0 K/uL   Eosinophils Relative 1 %    Eosinophils Absolute 0.1 0.0 - 0.5 K/uL   Basophils Relative 1 %   Basophils Absolute 0.1 0.0 - 0.1 K/uL   Immature Granulocytes 1 %   Abs Immature Granulocytes 0.13 (H) 0.00 - 0.07 K/uL  Protime-INR  Result Value Ref Range   Prothrombin Time 13.5 11.4 - 15.2 seconds   INR 1.0 0.8 - 1.2   DG Hip Unilat With Pelvis 2-3 Views Left  Result Date: 04/01/2021 CLINICAL DATA:  Fall today.  Left hip pain. EXAM: DG HIP (WITH OR WITHOUT PELVIS) 2-3V LEFT COMPARISON:  None. FINDINGS: Nondisplaced intertrochanteric left hip fracture seen. No evidence of dislocation. No pelvic fracture identified. Peripheral vascular calcification noted. IMPRESSION: Nondisplaced intertrochanteric left hip fracture. Electronically Signed   By: Marlaine Hind M.D.   On: 04/01/2021 15:15        Domenic Moras, PA-C [id number]    Lianne Cure, DO [id number] [phone]

## 2021-04-01 NOTE — ED Notes (Signed)
Attempted report 

## 2021-04-01 NOTE — H&P (Addendum)
[facility] Admission History and Physical Service Pager: [phone]  Patient name: [name] Medical record number: [id number] Date of birth: 1945/11/01 Age: 75 y.o. Gender: male  Primary Care Provider: [name], MD Consultants: Orthopedic surgery Code Status: FULL CODE  Preferred Emergency Contact:  Contact Information     Name Relation Home Work Mobile   [place] Spouse [phone]  [phone]   Jerene Dilling Daughter [phone]  [phone]   Yossef, Retana [phone]         Chief Complaint: Fall on left hip  Assessment and Plan: [name] is a 75 y.o. male presenting with a fall, found to have left hip intertrochanteric fracture scheduled for orthopedic surgery on 04/03/21, admitted for medical admission and clearance . PMH is significant for  CAD, CKD 3, GERD, CABG w/ 5 stents, neuropathy, HTN, HLD, PVD (s/p R-L fem pop bypass), T2DM, diverticulitis s/p sigmoid colectomy/colostomy.  Left hip intertrochanteric fracture [place] after reaching down to pick up medication today. No trauma to the head or LOC. Wife was present when he fell, EMS was called and he came to the ED. Received '10mg'$  morphine and 75 mcg Fentanyl en route to the hospital. Upon arrival, he was hemodynamically stable. Hip X-ray impression revealed nondisplaced intertrochanteric left hip fracture without evidence of dislocation. Received Dilaudid '1mg'$  x1 and 0.'5mg'$  x2 in the ED. Has been more unsteady recently, but has gait difficulties at baseline secondary to neuropathy in bilateral lower extremities.  Pain currently 6/10. Dr. Ninfa Linden with orthopedic surgery consulted and examined patient at bedside. Will plan for surgery on Sunday (8/21) because of being on Eliquis, although patient is unsure if he actually takes this and does not know the names of his medications well. Upon chart review unable to find any Eliquis.  - Admit to med-tele, attending Dr. Erin Hearing - Monitor  vital signs - Ortho following, appreciate recommendations - Acetaminophen 650 mg q6h PRN mild pain, fever - Morphine '4mg'$  q4 h PRN moderate to severe pain - Tylenol PRN - SCDs - PT/OT consult after surgery  CAD, s/p CABG with 5 stents Unstable angina No current chest pain. Last seen by cardiology 12/2020 (Dr. Quay Burow). Hx CAD s/p circumflex stenting in 2003. Admitted in 09/2020 with unstable angina and underwent cardiac catheterization and CABG in 2017.  On 3 antiplatelet therapy- ASA, Plavix, Cilostazol. Discussed the patient's case with cardiologist Dr. Audie Box regarding cardiac clearance/candidacy for surgery, and he said if patient is not having chest pain and EKG unchanged from prior, then the patient can proceed with surgery and it should not be delayed. - Cardiac monitoring - Hold Cilostazol, Plavix, ASA for now  PVD: Chronic  S/p R>L femorofemoral crossover grafting. Recent Doppler studies 12/21/2020 revealed patent right to left graft and patent right iliac stent. - Hold Cilostazol, Plavix, ASA for now  Hypertension: Chronic, stable Chronic, stable. BP 140s-160s/70s-80s Takes Losartan 50 mg daily, Metoprolol '25mg'$ , HCTZ 12.'5mg'$  daily. - Continue home meds - Monitor VS  T2DM: Chronic  Last A1c: 7.4 in 2020. CBG: 160. Takes Metformin 1,'000mg'$  2x daily, Lantus 20u and Ozempic 0.'25mg'$  weekly at home. - Continue home meds - CBG q4 hours - SSI moderate - Lantus 10u - A1c  CKD 3 Chronic, stable. Cr 1.24, BUN 16, GFR >60 on admission. - Monitor renal function with daily labs  Neuropathy Chronic, stable. Bilateral legs. Home meds: Gabapentin '600mg'$  TID - Continue home meds  FEN/GI: Heart healthy/carb modified Prophylaxis: SCDs  Disposition: Med-surg  History of Present Illness:  [name] is a 75 y.o. male presenting with fall, at home.  He was in his usual state of health until he was walking to get his medicine this morning and his balance went off and he fell onto  the hardwood floor. Was unable to get up. His wife was wife him- called EMS. No other prior hip fractures. Feet are numb from neuropathy. Has trouble walking at baseline.  Pain is 6-7/10.   Has had some diarrhea lately. Has not eaten since this morning. No dizziness/lightheadedness. Denies hitting his head. No chest pain or shortness of breath.   Takes 20U of Lantus and Ozempic for his diabetes. He checked his sugar in the morning and it was in the 90's. Says he might have took Eliquis yesterday, but is really unsure if this is a medication he is prescribed.   No tobacco, alcohol. No illicit drug use.  Review Of Systems:   Review of Systems  Gastrointestinal:  Negative for diarrhea.  Genitourinary:  Negative for dysuria.  Musculoskeletal:  Positive for arthralgias.  Skin:  Positive for wound.    Patient Active Problem List   Diagnosis Date Noted   CAP (community acquired pneumonia) 05/21/2019   Diarrhea 03/18/2019   Loss of weight 03/18/2019   Klebsiella sepsis ([place]) 09/15/2018   Urinary tract infection without hematuria    Acute lower UTI 09/13/2018   S/P colostomy takedown 03/26/2018   Hypertension 12/12/2017   Type 2 diabetes mellitus with diabetic neuropathy ([place]) 12/12/2017   CKD (chronic kidney disease), stage III ([place]) 12/12/2017   Hypercholesteremia 12/12/2017   Hypokalemia 12/12/2017   GI bleed 12/06/2017   Rectal bleed 12/06/2017   Uncontrolled type 2 diabetes mellitus with hyperglycemia, with long-term current use of insulin ([place]) 12/06/2017   Acute renal failure superimposed on stage 3 chronic kidney disease ([place]) 12/06/2017   Malnutrition of moderate degree 12/06/2017   Abnormal CT scan, colon 11/23/2017   Constipation 11/23/2017   Lobar pneumonia ([place]) 10/02/2017   Diverticulitis large intestine 08/19/2017   Diverticulitis of large intestine with perforation and abscess without bleeding    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 08/16/2017   CKD  (chronic kidney disease) stage 3, GFR 30-59 ml/min (HCC) 08/02/2017   Sepsis due to undetermined organism ([place]) 08/01/2017   GERD (gastroesophageal reflux disease) 08/01/2017   PAD (peripheral artery disease) ([place]) 08/09/2016   Claudication ([place]) 05/08/2016   HCAP (healthcare-associated pneumonia) 01/12/2016   Hyponatremia 01/12/2016   Abnormal chest CT 10/13/2015   Hx of CABG 10/13/2015   Type 2 diabetes, HbA1c goal < 7% (HCC)    Unstable angina (HCC)    Hypomagnesemia    Type 2 diabetes mellitus ([place]) 10/09/2015   ASCVD (arteriosclerotic cardiovascular disease) 10/09/2015   CKD (chronic kidney disease) stage 2, GFR 60-89 ml/min 10/09/2015   Sepsis ([place]) 07/09/2015   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 07/09/2015   PVD (peripheral vascular disease) ([place]) 07/09/2015   Essential hypertension 07/09/2015   Neuropathy, peripheral 07/09/2015   HLD (hyperlipidemia) 07/09/2015   AKI (acute kidney injury) ([place]) 07/09/2015   CAD S/P percutaneous coronary angioplasty 08/14/1996    Past Medical History: Past Medical History:  Diagnosis Date   CAD S/P percutaneous coronary angioplasty 1998   PCI TO CX   CKD (chronic kidney disease) stage 3, GFR 30-59 ml/min (HCC)    Diabetes mellitus without complication ([place])    Diverticulitis 08/16/2017   hospitalized with diverticulitis/sepsis   GERD (gastroesophageal reflux disease)    occassionally  Hx of CABG March 2017   x 5   Hypercholesteremia    Hypertension    Neuropathy    Peripheral vascular disease ([place])    s/p R-L FEM-FEM BYPASS   Pneumonia    Tachycardia    after CABG, pt on medicine for this    Past Surgical History: Past Surgical History:  Procedure Laterality Date   ABDOMINAL AORTOGRAM Bilateral 09/19/2016   Procedure: iliac;  Surgeon: Serafina Mitchell, MD;  Location: [facility];  Service: Cardiovascular;  Laterality: Bilateral;   CARDIAC CATHETERIZATION  2003   with stent   CARDIAC CATHETERIZATION N/A 10/11/2015    Procedure: Left Heart Cath and Coronary Angiography;  Surgeon: Peter M Martinique, MD;  Location: [place] [facility];  Service: Cardiovascular;  Laterality: N/A;   COLON RESECTION N/A 12/13/2017   Procedure: EXPLORATORY LAPAROTOMY, SIGMOID COLECTOMY WITH COLOSTOMY;  Surgeon: Donnie Mesa, MD;  Location: [facility];  Service: General;  Laterality: N/A;   COLONOSCOPY N/A 09/22/2013   Procedure: COLONOSCOPY;  Surgeon: Daneil Dolin, MD;  [facility];  Service: Endoscopy;  Laterality: N/A;  9:30 AM   COLONOSCOPY N/A 02/19/2018   Procedure: COLONOSCOPY;  Surgeon: Daneil Dolin, MD;  [facility];  Service: Endoscopy;  Laterality: N/A;  2:00pm   COLOSTOMY N/A 12/13/2017   Procedure: COLOSTOMY;  Surgeon: Donnie Mesa, MD;  Location: [facility];  Service: General;  Laterality: N/A;   COLOSTOMY REVERSAL N/A 03/26/2018   Procedure: COLOSTOMY REVERSAL;  Surgeon: Donnie Mesa, MD;  Location: [place] [facility];  Service: General;  Laterality: N/A;   CORONARY ARTERY BYPASS GRAFT N/A 10/18/2015   Procedure: CORONARY ARTERY BYPASS GRAFTING (CABG) x  five, using left internal mammary artery and right leg greater saphenous vein harvested endoscopically;  Surgeon: [name], MD;  Location: [place] [facility];  Service: Open Heart Surgery;  Laterality: N/A;   ENDARTERECTOMY FEMORAL Right 08/09/2016   Procedure: ENDARTERECTOMY FEMORAL WITH VEIN PATCH ANGIOPLASTY;  Surgeon: Serafina Mitchell, MD;  [facility];  Service: Vascular;  Laterality: Right;   ESOPHAGOGASTRODUODENOSCOPY N/A 10/24/2017   Dr. Gala Romney: hiatal hernia   FEMORAL-FEMORAL BYPASS GRAFT Bilateral 08/09/2016   Procedure: REVISION BYPASS GRAFT RIGHT FEMORAL-LEFT FEMORAL ARTERY;  Surgeon: Serafina Mitchell, MD;  Location: [facility];  Service: Vascular;  Laterality: Bilateral;   FEMORAL-POPLITEAL BYPASS GRAFT     PERIPHERAL VASCULAR CATHETERIZATION N/A 05/08/2016   Procedure: Lower Extremity Angiography;  Surgeon: Lorretta Harp, MD;  Location: [place] [facility];   Service: Cardiovascular;  Laterality: N/A;   PERIPHERAL VASCULAR INTERVENTION Right 09/19/2016   Procedure: Peripheral Vascular Intervention;  Surgeon: Serafina Mitchell, MD;  Location: [facility];  Service: Cardiovascular;  Laterality: Right;  ext iliac stent   POLYPECTOMY  02/19/2018   Procedure: POLYPECTOMY;  Surgeon: Daneil Dolin, MD;  [facility];  Service: Endoscopy;;   PROCTOSCOPY  10/24/2017   Procedure: PROCTOSCOPY;  Surgeon: Daneil Dolin, MD;  [facility];  Service: Endoscopy;;   TEE WITHOUT CARDIOVERSION N/A 10/18/2015   Procedure: TRANSESOPHAGEAL ECHOCARDIOGRAM (TEE);  Surgeon: [name], MD;  Location: [place];  Service: Open Heart Surgery;  Laterality: N/A;    Social History: Social History   Tobacco Use   Smoking status: Former    Packs/day: 2.00    Years: 30.00    Pack years: 60.00    Types: Cigarettes    Quit date: 08/14/2000    Years since quitting: 20.6  Smokeless tobacco: Never  Vaping Use   Vaping Use: Never used  Substance Use Topics   Alcohol use: No   Drug use: No   Please also refer to relevant sections of EMR.  Family History: Family History  Problem Relation Age of Onset   Heart attack Mother    Stroke Mother    Colon cancer Brother        late 62s    Allergies and Medications: No Known Allergies No current facility-administered medications on file prior to encounter.   Current Outpatient Medications on File Prior to Encounter  Medication Sig Dispense Refill   acetaminophen (TYLENOL) 325 MG tablet Take 2 tablets (650 mg total) by mouth every 6 (six) hours as needed for mild pain, fever or headache (or Fever >/= 101). 30 tablet 0   albuterol (VENTOLIN HFA) 108 (90 Base) MCG/ACT inhaler Inhale 2 puffs into the lungs every 6 (six) hours as needed for wheezing or shortness of breath. 18 g 3   amLODipine (NORVASC) 10 MG tablet Take 1 tablet (10 mg total) by mouth daily. 30 tablet 0   aspirin EC 81 MG tablet  Take 1 tablet (81 mg total) by mouth daily with breakfast. 30 tablet 2   atorvastatin (LIPITOR) 40 MG tablet Take 1 tablet (40 mg total) by mouth daily at 6 PM. 30 tablet 1   cilostazol (PLETAL) 100 MG tablet Take 100 mg by mouth 2 (two) times daily.     clopidogrel (PLAVIX) 75 MG tablet Take 75 mg by mouth daily.     empagliflozin (JARDIANCE) 25 MG TABS tablet Take 12.5 mg by mouth daily.      ferrous sulfate 325 (65 FE) MG tablet Take 325 mg by mouth 3 (three) times daily.      gabapentin (NEURONTIN) 600 MG tablet Take 600 mg by mouth 3 (three) times daily.      hydrochlorothiazide (HYDRODIURIL) 12.5 MG tablet Take 1 tablet (12.5 mg total) by mouth daily. 30 tablet 1   insulin glargine (LANTUS) 100 UNIT/ML injection Inject 20 Units into the skin at bedtime.     metFORMIN (GLUCOPHAGE) 1000 MG tablet Take 1,000 mg by mouth 2 (two) times daily with a meal.      metoprolol tartrate (LOPRESSOR) 50 MG tablet Take 25 mg by mouth 2 (two) times daily.      pantoprazole (PROTONIX) 40 MG tablet Take 1 tablet (40 mg total) by mouth daily. 5 tablet 0   Semaglutide,0.25 or 0.'5MG'$ /DOS, (OZEMPIC, 0.25 OR 0.5 MG/DOSE,) 2 MG/1.5ML SOPN Inject 0.25 mg into the skin every Sunday. *Takes in the Evening**OZEMPIC      Objective: BP 134/61   Pulse 89   Temp 99.1 F (37.3 C) (Oral)   Resp 20   Ht '5\' 7"'$  (1.702 m)   Wt 64.4 kg   SpO2 96%   BMI 22.24 kg/m  Exam: General: Well-appearing, pleasant, conversational Eyes: EOMI ENTM: MMM Neck: Supple, normal ROM Cardiovascular: RRR, no murmurs appreciated Respiratory: CTAB, no wheezing/crackles. No respiratory distress Gastrointestinal: Soft, non-tender, non-distended MSK: No deformities noted. No bruising over left hip.  Derm: No rashes. Abrasion over dorsal surface of right hand. Small cut with dried blood on right hand. Neuro: Alert, oriented. No focal neurologic deficits. Psych: Normal mood and affect.  Labs and Imaging: CBC BMET  Recent Labs  Lab  04/01/21 1507  WBC 13.4*  HGB 13.1  HCT 41.1  PLT 325   Recent Labs  Lab 04/01/21 1507  NA 136  K  4.0  CL 99  CO2 20*  BUN 16  CREATININE 1.24  GLUCOSE 160*  CALCIUM 8.5*     EKG: Sinus tachycardia,101 bpm , no acute ST or T wave changes. Appears similar to EKG from 11/2019.  Orvis Brill, DO 04/01/2021, 4:35 PM PGY-1, [place] [facility] pager: [phone], text pages welcome  FPTS Upper-Level Resident Addendum   I have independently interviewed and examined the patient. I have discussed the above with the original author and agree with their documentation. My edits for correction/addition/clarification are included where appropriate. Please see also any attending notes.   Sharion Settler, DO PGY-2, [facility] 04/01/2021 7:58 PM  FPTS Service pager: [phone] (text pages welcome through [facility])

## 2021-04-01 NOTE — ED Notes (Signed)
Patient transported to X-ray 

## 2021-04-01 NOTE — ED Provider Notes (Signed)
[facility] EMERGENCY DEPARTMENT Provider Note   CSN: [id number] Arrival date & time: 04/01/21  1323     History Chief Complaint  Patient presents with   Lytle Michaels    [name] is a 75 y.o. male with a PMH of CAD s/p CABG, PVD, PAD, HTN, HLD, DM2, CKD stage II. He presents to the ED via EMS after a fall in his kitchen. He states that he was turning and he lost his balance.  He states that he fell on his left hip and was unable to ambulate after.  He has limited ROM with significant pain in the left hip. He denies hitting his head. He denies any syncope, dizziness, lightheadedness, or chest pain prior to falling. Patient denies any other painful locations. No shortness of breath, chest pain, abdominal pain, or back pain. Denies numbness or tingling in lower extremities.       Past Medical History:  Diagnosis Date   CAD S/P percutaneous coronary angioplasty 1998   PCI TO CX   CKD (chronic kidney disease) stage 3, GFR 30-59 ml/min (HCC)    Diabetes mellitus without complication ([place])    Diverticulitis 08/16/2017   hospitalized with diverticulitis/sepsis   GERD (gastroesophageal reflux disease)    occassionally   Hx of CABG March 2017   x 5   Hypercholesteremia    Hypertension    Neuropathy    Peripheral vascular disease ([place])    s/p R-L FEM-FEM BYPASS   Pneumonia    Tachycardia    after CABG, pt on medicine for this    Patient Active Problem List   Diagnosis Date Noted   CAP (community acquired pneumonia) 05/21/2019   Diarrhea 03/18/2019   Loss of weight 03/18/2019   Klebsiella sepsis ([place]) 09/15/2018   Urinary tract infection without hematuria    Acute lower UTI 09/13/2018   S/P colostomy takedown 03/26/2018   Hypertension 12/12/2017   Type 2 diabetes mellitus with diabetic neuropathy ([place]) 12/12/2017   CKD (chronic kidney disease), stage III ([place]) 12/12/2017   Hypercholesteremia 12/12/2017   Hypokalemia 12/12/2017   GI bleed 12/06/2017   Rectal  bleed 12/06/2017   Uncontrolled type 2 diabetes mellitus with hyperglycemia, with long-term current use of insulin ([place]) 12/06/2017   Acute renal failure superimposed on stage 3 chronic kidney disease ([place]) 12/06/2017   Malnutrition of moderate degree 12/06/2017   Abnormal CT scan, colon 11/23/2017   Constipation 11/23/2017   Lobar pneumonia ([place]) 10/02/2017   Diverticulitis large intestine 08/19/2017   Diverticulitis of large intestine with perforation and abscess without bleeding    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 08/16/2017   CKD (chronic kidney disease) stage 3, GFR 30-59 ml/min (HCC) 08/02/2017   Sepsis due to undetermined organism ([place]) 08/01/2017   GERD (gastroesophageal reflux disease) 08/01/2017   PAD (peripheral artery disease) ([place]) 08/09/2016   Claudication ([place]) 05/08/2016   HCAP (healthcare-associated pneumonia) 01/12/2016   Hyponatremia 01/12/2016   Abnormal chest CT 10/13/2015   Hx of CABG 10/13/2015   Type 2 diabetes, HbA1c goal < 7% (HCC)    Unstable angina ([place])    Hypomagnesemia    Type 2 diabetes mellitus ([place]) 10/09/2015   ASCVD (arteriosclerotic cardiovascular disease) 10/09/2015   CKD (chronic kidney disease) stage 2, GFR 60-89 ml/min 10/09/2015   Sepsis ([place]) 07/09/2015   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 07/09/2015   PVD (peripheral vascular disease) ([place]) 07/09/2015   Essential hypertension 07/09/2015   Neuropathy, peripheral 07/09/2015   HLD (hyperlipidemia)  07/09/2015   AKI (acute kidney injury) ([place]) 07/09/2015   CAD S/P percutaneous coronary angioplasty 08/14/1996    Past Surgical History:  Procedure Laterality Date   ABDOMINAL AORTOGRAM Bilateral 09/19/2016   Procedure: iliac;  Surgeon: Serafina Mitchell, MD;  Location: [facility];  Service: Cardiovascular;  Laterality: Bilateral;   CARDIAC CATHETERIZATION  2003   with stent   CARDIAC CATHETERIZATION N/A 10/11/2015   Procedure: Left Heart Cath and Coronary Angiography;   Surgeon: Peter M Martinique, MD;  Location: [facility];  Service: Cardiovascular;  Laterality: N/A;   COLON RESECTION N/A 12/13/2017   Procedure: EXPLORATORY LAPAROTOMY, SIGMOID COLECTOMY WITH COLOSTOMY;  Surgeon: Donnie Mesa, MD;  Location: [facility];  Service: General;  Laterality: N/A;   COLONOSCOPY N/A 09/22/2013   Procedure: COLONOSCOPY;  Surgeon: Daneil Dolin, MD;  [facility];  Service: Endoscopy;  Laterality: N/A;  9:30 AM   COLONOSCOPY N/A 02/19/2018   Procedure: COLONOSCOPY;  Surgeon: Daneil Dolin, MD;  [facility];  Service: Endoscopy;  Laterality: N/A;  2:00pm   COLOSTOMY N/A 12/13/2017   Procedure: COLOSTOMY;  Surgeon: Donnie Mesa, MD;  Location: [facility];  Service: General;  Laterality: N/A;   COLOSTOMY REVERSAL N/A 03/26/2018   Procedure: COLOSTOMY REVERSAL;  Surgeon: Donnie Mesa, MD;  Location: [facility];  Service: General;  Laterality: N/A;   CORONARY ARTERY BYPASS GRAFT N/A 10/18/2015   Procedure: CORONARY ARTERY BYPASS GRAFTING (CABG) x  five, using left internal mammary artery and right leg greater saphenous vein harvested endoscopically;  Surgeon: [name], MD;  Location: [facility];  Service: Open Heart Surgery;  Laterality: N/A;   ENDARTERECTOMY FEMORAL Right 08/09/2016   Procedure: ENDARTERECTOMY FEMORAL WITH VEIN PATCH ANGIOPLASTY;  Surgeon: Serafina Mitchell, MD;  Location: [facility];  Service: Vascular;  Laterality: Right;   ESOPHAGOGASTRODUODENOSCOPY N/A 10/24/2017   Dr. Gala Romney: hiatal hernia   FEMORAL-FEMORAL BYPASS GRAFT Bilateral 08/09/2016   Procedure: REVISION BYPASS GRAFT RIGHT FEMORAL-LEFT FEMORAL ARTERY;  Surgeon: Serafina Mitchell, MD;  Location: [facility] [facility];  Service: Vascular;  Laterality: Bilateral;   FEMORAL-POPLITEAL BYPASS GRAFT     PERIPHERAL VASCULAR CATHETERIZATION N/A 05/08/2016   Procedure: Lower Extremity Angiography;  Surgeon: Lorretta Harp, MD;  Location: [facility];  Service: Cardiovascular;  Laterality: N/A;   PERIPHERAL  VASCULAR INTERVENTION Right 09/19/2016   Procedure: Peripheral Vascular Intervention;  Surgeon: Serafina Mitchell, MD;  Location: [facility];  Service: Cardiovascular;  Laterality: Right;  ext iliac stent   POLYPECTOMY  02/19/2018   Procedure: POLYPECTOMY;  Surgeon: Daneil Dolin, MD;  [facility];  Service: Endoscopy;;   PROCTOSCOPY  10/24/2017   Procedure: PROCTOSCOPY;  Surgeon: Daneil Dolin, MD;  [facility];  Service: Endoscopy;;   TEE WITHOUT CARDIOVERSION N/A 10/18/2015   Procedure: TRANSESOPHAGEAL ECHOCARDIOGRAM (TEE);  Surgeon: [name], MD;  Location: [facility];  Service: Open Heart Surgery;  Laterality: N/A;       Family History  Problem Relation Age of Onset   Heart attack Mother    Stroke Mother    Colon cancer Brother        late 65s    Social History   Tobacco Use   Smoking status: Former    Packs/day: 2.00    Years: 30.00    Pack years: 60.00    Types: Cigarettes    Quit date: 08/14/2000    Years since quitting: 20.6  Smokeless tobacco: Never  Vaping Use   Vaping Use: Never used  Substance Use Topics   Alcohol use: No   Drug use: No    Home Medications Prior to Admission medications   Medication Sig Start Date End Date Taking? Authorizing Provider  acetaminophen (TYLENOL) 325 MG tablet Take 2 tablets (650 mg total) by mouth every 6 (six) hours as needed for mild pain, fever or headache (or Fever >/= 101). 05/23/19   Roxan Hockey, MD  albuterol (VENTOLIN HFA) 108 (90 Base) MCG/ACT inhaler Inhale 2 puffs into the lungs every 6 (six) hours as needed for wheezing or shortness of breath. 05/23/19   Roxan Hockey, MD  amLODipine (NORVASC) 10 MG tablet Take 1 tablet (10 mg total) by mouth daily. 04/01/18   Regalado, Jerald Kief A, MD  aspirin EC 81 MG tablet Take 1 tablet (81 mg total) by mouth daily with breakfast. 05/23/19   Roxan Hockey, MD  atorvastatin (LIPITOR) 40 MG tablet Take 1 tablet (40 mg total) by mouth daily at 6  PM. 09/16/18   Tat, Shanon Brow, MD  cilostazol (PLETAL) 100 MG tablet Take 100 mg by mouth 2 (two) times daily.    [provider]  clopidogrel (PLAVIX) 75 MG tablet Take 75 mg by mouth daily.    [provider]  empagliflozin (JARDIANCE) 25 MG TABS tablet Take 12.5 mg by mouth daily.     [provider]  ferrous sulfate 325 (65 FE) MG tablet Take 325 mg by mouth 3 (three) times daily.     [provider]  gabapentin (NEURONTIN) 600 MG tablet Take 600 mg by mouth 3 (three) times daily.     [provider]  hydrochlorothiazide (HYDRODIURIL) 12.5 MG tablet Take 1 tablet (12.5 mg total) by mouth daily. 05/23/19   Roxan Hockey, MD  insulin glargine (LANTUS) 100 UNIT/ML injection Inject 20 Units into the skin at bedtime.    [provider]  metFORMIN (GLUCOPHAGE) 1000 MG tablet Take 1,000 mg by mouth 2 (two) times daily with a meal.     [provider]  metoprolol tartrate (LOPRESSOR) 50 MG tablet Take 25 mg by mouth 2 (two) times daily.     [provider]  pantoprazole (PROTONIX) 40 MG tablet Take 1 tablet (40 mg total) by mouth daily. 08/19/17   Dhungel, Flonnie Overman, MD  Semaglutide,0.25 or 0.'5MG'$ /DOS, (OZEMPIC, 0.25 OR 0.5 MG/DOSE,) 2 MG/1.5ML SOPN Inject 0.25 mg into the skin every Sunday. *Takes in the Evening**OZEMPIC    [provider]    Allergies    Patient has no known allergies.  Review of Systems   Review of Systems  Constitutional: Negative.   HENT: Negative.    Eyes: Negative.  Negative for visual disturbance.  Respiratory:  Negative for chest tightness and shortness of breath.   Cardiovascular:  Negative for chest pain.  Gastrointestinal:  Negative for abdominal pain, nausea and vomiting.  Endocrine: Negative.   Genitourinary: Negative.   Musculoskeletal:  Positive for arthralgias and gait problem. Negative for back pain and neck pain.  Skin:  Negative for color change.  Neurological:  Negative for  dizziness, syncope, weakness, light-headedness, numbness and headaches.  All other systems reviewed and are negative.  Physical Exam Updated Vital Signs BP (!) 140/59   Pulse 91   Temp 99.1 F (37.3 C) (Oral)   Resp 19   Ht '5\' 7"'$  (1.702 m)   Wt 64.4 kg   SpO2 96%   BMI 22.24 kg/m   Physical Exam  Vitals and nursing note reviewed.  Constitutional:      General: He is in acute distress.     Appearance: Normal appearance.  HENT:     Head: Normocephalic and atraumatic.  Cardiovascular:     Rate and Rhythm: Normal rate and regular rhythm.     Pulses: Normal pulses.     Heart sounds: Normal heart sounds. No murmur heard.   No friction rub. No gallop.  Pulmonary:     Effort: Pulmonary effort is normal. No respiratory distress.     Breath sounds: Normal breath sounds. No wheezing, rhonchi or rales.  Abdominal:     General: Abdomen is flat. There is no distension.     Palpations: Abdomen is soft.     Tenderness: There is no abdominal tenderness. There is no guarding or rebound.  Musculoskeletal:        General: Tenderness and signs of injury present. No swelling or deformity.     Cervical back: Normal range of motion. No bony tenderness.     Thoracic back: No bony tenderness.     Lumbar back: No bony tenderness.     Right hip: Normal.     Left hip: Tenderness and bony tenderness present. No deformity. Decreased range of motion. Decreased strength.  Skin:    General: Skin is warm and dry.     Findings: No bruising.  Neurological:     Mental Status: He is alert and oriented to person, place, and time.     Sensory: Sensation is intact.    ED Results / Procedures / Treatments   Labs (all labs ordered are listed, but only abnormal results are displayed) Labs Reviewed  RESP PANEL BY RT-PCR (FLU A&B, COVID) ARPGX2  BASIC METABOLIC PANEL  CBC WITH DIFFERENTIAL/PLATELET  PROTIME-INR    EKG None  Radiology DG Hip Unilat With Pelvis 2-3 Views Left  Result Date:  04/01/2021 CLINICAL DATA:  Fall today.  Left hip pain. EXAM: DG HIP (WITH OR WITHOUT PELVIS) 2-3V LEFT COMPARISON:  None. FINDINGS: Nondisplaced intertrochanteric left hip fracture seen. No evidence of dislocation. No pelvic fracture identified. Peripheral vascular calcification noted. IMPRESSION: Nondisplaced intertrochanteric left hip fracture. Electronically Signed   By: Marlaine Hind M.D.   On: 04/01/2021 15:15    Procedures Procedures   Medications Ordered in ED Medications  ondansetron (ZOFRAN) injection 4 mg (has no administration in time range)  HYDROmorphone (DILAUDID) injection 0.5 mg (0.5 mg Intravenous Given 04/01/21 1403)    ED Course  I have reviewed the triage vital signs and the nursing notes.  Pertinent labs & imaging results that were available during my care of the patient were reviewed by me and considered in my medical decision making (see chart for details).    MDM Rules/Calculators/A&P                          Patient presents to the ED via EMS following a fall on his left hip. Patient appears uncomfortable. Nontoxic, vitals stable. Patient unable to ambulate or move left hip without significant pain. There is no concern for head injury or injury of another body part. NO concern for alternate disease process leading to fall based on the patient's history.   Initial EKG unremarkable. Obtained XR of Left hip. Pain treated with 0.5 mg Dilaudid.  Left Hip XR Read: Nondisplaced intertrochanteric left hip fracture. Dr. Philipp Ovens has seen patient and plans to operate on Sunday. Labs pending. Will need admission to  hospitalist service.  Sign out given to Rona Ravens, PA-C who will take over for the remainder of this patient's care.   Final Clinical Impression(s) / ED Diagnoses Final diagnoses:  None    Rx / DC Orders ED Discharge Orders     None        Adolphus Birchwood, PA-C 04/01/21 1544    Charlesetta Shanks, MD 04/17/21 1436

## 2021-04-01 NOTE — Progress Notes (Signed)
Patient ID: [name], male   DOB: [birth year], 75 y.o.   MRN: [id number] I have now seen and examined [name] at the bedside.  He does have a left hip intertrochanteric fracture.  I have recommended surgical intervention for this fracture.  This will involve a rod and hip screw which will then allow him to put full weight on this hip through the healing process.  Family at the bedside report that he has been unsteady recently.  This was a mechanical fall that he sustained this morning causing hip fracture.  I spoke with them about the treatment plan from an orthopedic standpoint.  He is on Eliquis, thus, I will delay surgery until this Sunday (8/21) to allow some of the blood thinning medication to get out of the system.  Given his comorbidities, he will certainly need clearance for surgery.  I did speak about the risk and benefits of this type of surgery and I recommended that based on the fact that he is someone who was walking before this and is having problems that amount of pain and the standard of care for fracture such as this is surgical fixation in order to decrease someone's pain and improve their mobility.  He can eat today from orthopedic standpoint.  I will make sure that I see him each day this weekend in anticipation of surgery this coming Sunday.

## 2021-04-01 NOTE — Progress Notes (Signed)
Patient ID: [name], male   DOB: February 29, 1946, 75 y.o.   MRN: [id number] I was given a heads up about this patient since his daughter-in-law works in [facility].  Also, I am the orthopedic surgeon on-call today for our practice and further care in the ER.  I reviewed the x-rays and he does have a left hip intertrochanteric fracture.  This will need a surgical intervention.  Given the fact that he has been on Eliquis, surgery will need to be delayed for likely 48 hours.  He can eat today from my standpoint and he will need medical admission and clearance for surgery.  I will also see him today.

## 2021-04-02 ENCOUNTER — Encounter (HOSPITAL_COMMUNITY): Payer: Self-pay | Admitting: Student

## 2021-04-02 DIAGNOSIS — I2510 Atherosclerotic heart disease of native coronary artery without angina pectoris: Secondary | ICD-10-CM | POA: Diagnosis not present

## 2021-04-02 DIAGNOSIS — E782 Mixed hyperlipidemia: Secondary | ICD-10-CM

## 2021-04-02 DIAGNOSIS — S72145A Nondisplaced intertrochanteric fracture of left femur, initial encounter for closed fracture: Secondary | ICD-10-CM | POA: Diagnosis not present

## 2021-04-02 DIAGNOSIS — Z01810 Encounter for preprocedural cardiovascular examination: Secondary | ICD-10-CM

## 2021-04-02 DIAGNOSIS — S72002A Fracture of unspecified part of neck of left femur, initial encounter for closed fracture: Secondary | ICD-10-CM | POA: Diagnosis not present

## 2021-04-02 DIAGNOSIS — W19XXXA Unspecified fall, initial encounter: Secondary | ICD-10-CM | POA: Diagnosis not present

## 2021-04-02 LAB — CBC
HCT: 39.1 % (ref 39.0–52.0)
Hemoglobin: 13.1 g/dL (ref 13.0–17.0)
MCH: 29.6 pg (ref 26.0–34.0)
MCHC: 33.5 g/dL (ref 30.0–36.0)
MCV: 88.3 fL (ref 80.0–100.0)
Platelets: 289 10*3/uL (ref 150–400)
RBC: 4.43 MIL/uL (ref 4.22–5.81)
RDW: 14.9 % (ref 11.5–15.5)
WBC: 10.9 10*3/uL — ABNORMAL HIGH (ref 4.0–10.5)
nRBC: 0 % (ref 0.0–0.2)

## 2021-04-02 LAB — GLUCOSE, CAPILLARY
Glucose-Capillary: 113 mg/dL — ABNORMAL HIGH (ref 70–99)
Glucose-Capillary: 126 mg/dL — ABNORMAL HIGH (ref 70–99)
Glucose-Capillary: 117 mg/dL — ABNORMAL HIGH (ref 70–99)
Glucose-Capillary: 147 mg/dL — ABNORMAL HIGH (ref 70–99)

## 2021-04-02 MED ORDER — ACETAMINOPHEN 325 MG PO TABS
650.0000 mg | ORAL_TABLET | Freq: Four times a day (QID) | ORAL | Status: DC
  Administered 2021-04-02 – 2021-04-06 (×14): 650 mg via ORAL
  Filled 2021-04-02 (×15): qty 2

## 2021-04-02 MED ORDER — MORPHINE SULFATE (PF) 4 MG/ML IV SOLN
4.0000 mg | INTRAVENOUS | Status: DC
  Administered 2021-04-02 – 2021-04-03 (×5): 4 mg via INTRAVENOUS
  Filled 2021-04-02 (×5): qty 1

## 2021-04-02 MED ORDER — MUPIROCIN 2 % EX OINT
1.0000 "application " | TOPICAL_OINTMENT | Freq: Two times a day (BID) | CUTANEOUS | Status: DC
  Administered 2021-04-02 – 2021-04-06 (×7): 1 via NASAL
  Filled 2021-04-02: qty 22

## 2021-04-02 NOTE — Plan of Care (Signed)
VSS. Bedrest. Condom cath in place. C/o pain, scheduled pain medication given. Tolerating diet. Call bell within reach. Bed alarm on. Family at bedside. Plan for OR tomorrow,  NPO at MN.   Problem: Clinical Measurements: Goal: Ability to maintain clinical measurements within normal limits will improve 04/02/2021 1500 by Karis Juba, RN Outcome: Progressing   Problem: Clinical Measurements: Goal: Will remain free from infection 04/02/2021 1500 by Karis Juba, RN Outcome: Progressing   Problem: Clinical Measurements: Goal: Diagnostic test results will improve 04/02/2021 1500 by Karis Juba, RN Outcome: Progressing   Problem: Clinical Measurements: Goal: Respiratory complications will improve 04/02/2021 1500 by Karis Juba, RN Outcome: Progressing   Problem: Clinical Measurements: Goal: Cardiovascular complication will be avoided 04/02/2021 1500 by Karis Juba, RN Outcome: Progressing   Problem: Activity: Goal: Risk for activity intolerance will decrease 04/02/2021 1500 by Karis Juba, RN Outcome: Progressing   Problem: Nutrition: Goal: Adequate nutrition will be maintained 04/02/2021 1500 by Karis Juba, RN Outcome: Progressing   Problem: Elimination: Goal: Will not experience complications related to bowel motility 04/02/2021 1500 by Karis Juba, RN Outcome: Progressing   Problem: Pain Managment: Goal: General experience of comfort will improve 04/02/2021 1500 by Karis Juba, RN Outcome: Progressing   Problem: Safety: Goal: Ability to remain free from injury will improve 04/02/2021 1500 by Karis Juba, RN Outcome: Progressing   Problem: Skin Integrity: Goal: Risk for impaired skin integrity will decrease 04/02/2021 1500 by Karis Juba, RN Outcome: Progressing

## 2021-04-02 NOTE — Progress Notes (Addendum)
Family Medicine Teaching Service Daily Progress Note Intern Pager: [phone]  Patient name: [name] Medical record number: [id number] Date of birth: October 06, 1945 Age: 75 y.o. Gender: male  Primary Care Provider: [name], MD Consultants: Orthopedics  Code Status: Full Code   Pt Overview and Major Events to Date:  8/19: admitted for left hip fracture, holding AC    Assessment and Plan: [name] is a 75 y.o. male who presented after a fall with left hip fracture awaiting surgery on 8/21, stable.   Left Hip Fracture  Patient reports morphine has been helping with left hip pain. He reports some pain this AM and was waiting to receive medications. He is able to move feet but has severe pain with lifting his LLE. Pulses palpable in foot/ankle. Orthopedics following.  - plan for hip surgery on 8/21  - orthopedics following, appreciate care and recommendations for [name]  - scheduled morphine q 4h ours  - continue gabapentin '600mg'$  TID  - scheduled Tylenol '650mg'$  q6 hrs - will need PT/OT post op    CAD s/p CABG  PAD  Given cardiac hx, consulted cardiology prior to operation on 8/21. Patient denies chest pain or SOB. Pt home regimen written to include plavix, pletal and ASA.  - follow up cardiology recs -holding home ASA & plavix and pletal in preparation for surgery, ok to restart after procedure   HTN  BP elevated ranging from [id number] systolic, partially related to pain control from hip.  - continue hm losartan '50mg'$   - continue HCTZ 12.'5mg'$   - continie home amlodipine '10mg'$   - continue home metoprolol   T2DM  CBG ranged 113-130s, no sliding scale insulin given overnight. A1c 6.9. Home regimen includes lantus 20 units daily in addition to metformin, ozempic and jardiance.   - glargine 10 units qHS  - sliding scale insuling, moderate  - holding home metformin,ozempic & jardiance while inpatient   HLD  Continue home atorvastatin '40mg'$    FEN/GI: heart healthy,carb  modified, NPO at MN in preparation for surgery   PPx: protonix, no pharm dvt ppx in prep for surgery and no SCDS 2/2 pain in LLE  Dispo:Pending PT recommendations  in 2-3 days. Barriers include safety dispo planning .   Subjective:  Patient reports that pain medications have been helping with his lower extremity pain.  He reports that he can move his toes but has problems with lifting his left lower extremity.  Patient denies any difficulty breathing or pain elsewhere.  Objective: Temp:  [97.6 F (36.4 C)-98.9 F (37.2 C)] 98 F (36.7 C) (08/20 1535) Pulse Rate:  [72-105] 76 (08/20 1535) Resp:  [13-20] 18 (08/20 1202) BP: (115-178)/(61-80) 140/68 (08/20 1535) SpO2:  [91 %-98 %] 95 % (08/20 1535)  Physical Exam: General: Elderly gentleman lying in bed in no acute distress Cardiovascular: Regular rate and rhythm Respiratory: Stable on room air, no wheezing or crackles appreciated Abdomen: Soft, nontender, bowel sounds present Extremities: No lower extremity pitting edema, left hip externally rotated  Laboratory: Recent Labs  Lab 04/01/21 1507 04/02/21 0024  WBC 13.4* 10.9*  HGB 13.1 13.1  HCT 41.1 39.1  PLT 325 289   Recent Labs  Lab 04/01/21 1507  NA 136  K 4.0  CL 99  CO2 20*  BUN 16  CREATININE 1.24  CALCIUM 8.5*  GLUCOSE 160*      Imaging/Diagnostic Tests: No results found.   Eulis Foster, MD 04/02/2021, 3:44 PM PGY-3, [facility] Intern pager:  [phone], text pages welcome

## 2021-04-02 NOTE — Hospital Course (Addendum)
[name] is a 75 y.o. male who presented to [facility] after a fall in which he sustained a left hip fracture. PMH is significant for CAD s/p CABG, PAD, hypertension, hyperlipidemia, DM2, CKD, stage II.   Left hip fracture X-ray confirmed left hip fracture on admission.  Patient was evaluated by orthopedic surgery who recommended surgery after 48 hours of holding Eliquis.  Patient was not completely sure whether or not he had taken this recently. He had a left hip intramedullary nail placement with orthopedic surgery on 04/03/21 under general anesthesia with no complications. He received oxycodone '5mg'$  PRN and Tylenol for pain control. At the time of discharge, his pain was well-controlled with Tylenol, he was eating and drinking normally and he was hemodynamically stable. Patient was evaluated by physical therapy and Occupational Therapy following his surgery with recommendations for Home Health PT.  CAD s/p CABG and history of PAD Due to history of CAD s/p CABG and history of PAD, patient was recommended for cardiac preoperative evaluation.  Cardiology was consulted and evaluated the patient on 8/20.  Patient was determined to be low risk and acceptable to proceed with hip surgery scheduled for 8/21.  Patient's aspirin, Pletal and Plavix were recommended to be held prior to surgery and resume postoperatively.  Patient denied anginal symptoms at the time of admission. His medications were resumed on 04/04/21.  Issues for PCP f/u  Patient instructed to discontinue taking Amlodipine and HCTZ due to low blood pressure (70s/40s) while working with PT/OT, until follow-up appointment with PCP, at that time can be adjusted as necessary. Pt will need outpatient cardiology follow up with Dr. Gwenlyn Found to confirm if he should continue ASA,Plavix and Pletal therapy.  Please confirm patient is receiving home health physical therapy after hip replacement.  Patient had elevated creatinine and concern for doses of gabapentin  and worsening renal function. Please check BMP for creatinine and consider decreasing gabapentin dose.  Patient's blood pressure medications were held during admission. Please adjust as deemed appropriate at outpatient follow up visit.

## 2021-04-02 NOTE — Progress Notes (Signed)
Patient ID: [name], male   DOB: [birth year], 75 y.o.   MRN: [id number] The patient is awake and alert this morning.  He understands that we are delaying surgery until tomorrow morning due to the fact that he has been on Eliquis.  He has a left hip intertrochanteric fracture.  The plan is to proceed to surgery for open reduction/internal fixation of this fracture.  It is a fracture pattern that, if surgery goes well, we will plan on allowing him to weight-bear as tolerated and be up with therapy once surgery is completed.  He can eat today and should be n.p.o. after midnight for surgery that is scheduled again tomorrow morning.  His left lower extremity is well-perfused.  I explained in detail what the surgery involves and discussed the risks and benefits of surgery as well.  He is slightly hypertensive.  Pain is likely a contributing factor to this.  His H&H is stable.

## 2021-04-02 NOTE — Consult Note (Addendum)
Cardiology Consultation:   Patient ID: [name]; [id number]; 07-Jun-1946   Admit date: 04/01/2021 Date of Consult: 04/02/2021  Primary Care Provider: [name], MD Primary Cardiologist: Dr. Gwenlyn Found, MD   Patient Profile:   [name] is a 75 y.o. male with a hx of CAD s/p LCx stent 2003 and subsequent CABG 09/2015 with LIMA to his LAD, SVG to diagonal branch, obtuse marginal branches 1 and 2 and the distal RCA, PVD s/p multiple interventions of the lower extremities including endarterectomy and patch angioplasty of his calcified right common femoral artery as well as stenting of his iliac artery 2017 with recent doppler studies showing a patent right to left femorofemoral crossover graft and patent right iliac stent. Also with a hx of remote tobacco use with known lung nodules, HTN, HLD, DM2, and CKD stage II who is being seen today for preoperative clearance prior to  at the request of Dr. Ninfa Linden.  History of Present Illness:   [name] is a 75yo M with a hx as stated above who presented to [facility] 04/01/21 after sustaining a mechanical fall resulting in a left hip intertrochanteric fracture. Pt reports he has been in his usual state of health prior to hospital presentation when he lost his balance and suffered a mechanical fall. He was seen by ortho/trauma who have plans for surgical intervention 04/03/21.   He is followed with Dr. Gwenlyn Found for his cardiology care. He had PCI to LCx 2003. LHC 09/2015 revealed three-vessel disease and he ultimately underwent CABG by Dr. Roxan Hockey 10/18/2015 with a LIMA to his LAD, SVG to diagonal branch, obtuse marginal branches 1 and 2 and distal RCA. In reference to his PVD, he had remote right iliac stenting by Dr. Gwenlyn Found with subsequent right to left femorofemoral crossover grafting by Dr. Amedeo Plenty back in the 1990s. Due to worsening claudication angiography performed 05/08/2016 showing a patent right to left femorofemoral crossover graft, patent right iliac  stent, high-grade calcified right external iliac artery stenosis and right common femoral artery stenosis proximal to the bypass graft takeoff, occluded SFAs bilaterally with high-grade proximal left anterior tibial artery stenosis and occluded left tibioperoneal trunk. He subsequently underwent endarterectomy and patch angioplasty of his calcified right common femoral artery as well as stenting of his iliac artery by Dr. Trula Slade.    He was last seen in follow up 12/29/20 with Dr. Gwenlyn Found. He was doing well from a CV standpoint at that time with no anginal symptoms. He was continued on his current medications.  On my exam, he reports that he has had no recent anginal symptoms. He reports he is fairly sedentary with his activity however reports that he continues to mow his grass and perform other yard work without any issues. He states that he is able to walk a city block at a slow/normal pace without problems but would have difficulty with claudication if he increased his speed. He has stairs in his home and is able to climb ok. He denies SOB, palpitations, DOE, LE edema, or orthopnea symptoms.   Past Medical History:  Diagnosis Date   CAD S/P percutaneous coronary angioplasty 1998   PCI TO CX   CKD (chronic kidney disease) stage 3, GFR 30-59 ml/min (HCC)    Diabetes mellitus without complication ([place])    Diverticulitis 08/16/2017   hospitalized with diverticulitis/sepsis   GERD (gastroesophageal reflux disease)    occassionally   Hx of CABG March 2017   x 5   Hypercholesteremia  Hypertension    Neuropathy    Peripheral vascular disease (HCC)    s/p R-L FEM-FEM BYPASS   Pneumonia    Tachycardia    after CABG, pt on medicine for this    Past Surgical History:  Procedure Laterality Date   ABDOMINAL AORTOGRAM Bilateral 09/19/2016   Procedure: iliac;  Surgeon: Serafina Mitchell, MD;  Location: [place] [facility];  Service: Cardiovascular;  Laterality: Bilateral;   CARDIAC CATHETERIZATION  2003    with stent   CARDIAC CATHETERIZATION N/A 10/11/2015   Procedure: Left Heart Cath and Coronary Angiography;  Surgeon: Peter M Martinique, MD;  Location: [place] [facility];  Service: Cardiovascular;  Laterality: N/A;   COLON RESECTION N/A 12/13/2017   Procedure: EXPLORATORY LAPAROTOMY, SIGMOID COLECTOMY WITH COLOSTOMY;  Surgeon: Donnie Mesa, MD;  Location: [place];  Service: General;  Laterality: N/A;   COLONOSCOPY N/A 09/22/2013   Procedure: COLONOSCOPY;  Surgeon: Daneil Dolin, MD;  [facility];  Service: Endoscopy;  Laterality: N/A;  9:30 AM   COLONOSCOPY N/A 02/19/2018   Procedure: COLONOSCOPY;  Surgeon: Daneil Dolin, MD;  [facility];  Service: Endoscopy;  Laterality: N/A;  2:00pm   COLOSTOMY N/A 12/13/2017   Procedure: COLOSTOMY;  Surgeon: Donnie Mesa, MD;  Location: [place];  Service: General;  Laterality: N/A;   COLOSTOMY REVERSAL N/A 03/26/2018   Procedure: COLOSTOMY REVERSAL;  Surgeon: Donnie Mesa, MD;  Location: [place];  Service: General;  Laterality: N/A;   CORONARY ARTERY BYPASS GRAFT N/A 10/18/2015   Procedure: CORONARY ARTERY BYPASS GRAFTING (CABG) x  five, using left internal mammary artery and right leg greater saphenous vein harvested endoscopically;  Surgeon: [name], MD;  Location: [place];  Service: Open Heart Surgery;  Laterality: N/A;   ENDARTERECTOMY FEMORAL Right 08/09/2016   Procedure: ENDARTERECTOMY FEMORAL WITH VEIN PATCH ANGIOPLASTY;  Surgeon: Serafina Mitchell, MD;  [facility];  Service: Vascular;  Laterality: Right;   ESOPHAGOGASTRODUODENOSCOPY N/A 10/24/2017   Dr. Gala Romney: hiatal hernia   FEMORAL-FEMORAL BYPASS GRAFT Bilateral 08/09/2016   Procedure: REVISION BYPASS GRAFT RIGHT FEMORAL-LEFT FEMORAL ARTERY;  Surgeon: Serafina Mitchell, MD;  Location: [place];  Service: Vascular;  Laterality: Bilateral;   FEMORAL-POPLITEAL BYPASS GRAFT     PERIPHERAL VASCULAR CATHETERIZATION N/A 05/08/2016   Procedure: Lower Extremity Angiography;  Surgeon:  Lorretta Harp, MD;  Location: [place] [facility];  Service: Cardiovascular;  Laterality: N/A;   PERIPHERAL VASCULAR INTERVENTION Right 09/19/2016   Procedure: Peripheral Vascular Intervention;  Surgeon: Serafina Mitchell, MD;  Location: [employer] [facility];  Service: Cardiovascular;  Laterality: Right;  ext iliac stent   POLYPECTOMY  02/19/2018   Procedure: POLYPECTOMY;  Surgeon: Daneil Dolin, MD;  [facility];  Service: Endoscopy;;   PROCTOSCOPY  10/24/2017   Procedure: PROCTOSCOPY;  Surgeon: Daneil Dolin, MD;  [facility];  Service: Endoscopy;;   TEE WITHOUT CARDIOVERSION N/A 10/18/2015   Procedure: TRANSESOPHAGEAL ECHOCARDIOGRAM (TEE);  Surgeon: [name], MD;  Location: [place];  Service: Open Heart Surgery;  Laterality: N/A;     Prior to Admission medications   Medication Sig Start Date End Date Taking? Authorizing Provider  amLODipine (NORVASC) 10 MG tablet Take 1 tablet (10 mg total) by mouth daily. 04/01/18  Yes Regalado, Belkys A, MD  aspirin EC 81 MG tablet Take 1 tablet (81 mg total) by mouth daily with breakfast. Patient taking differently: Take 81 mg by mouth at bedtime. 05/23/19  Yes Emokpae, Courage, MD  atorvastatin (LIPITOR) 40 MG tablet Take 1 tablet (40 mg total) by mouth daily at 6 PM. Patient taking differently: Take 40 mg by mouth at bedtime. 09/16/18  Yes Tat, Shanon Brow, MD  cilostazol (PLETAL) 100 MG tablet Take 100 mg by mouth 2 (two) times daily.   Yes [provider]  clopidogrel (PLAVIX) 75 MG tablet Take 75 mg by mouth daily.   Yes [provider]  empagliflozin (JARDIANCE) 25 MG TABS tablet Take 12.5 mg by mouth daily.    Yes [provider]  ferrous sulfate 325 (65 FE) MG tablet Take 325 mg by mouth 3 (three) times daily.    Yes [provider]  gabapentin (NEURONTIN) 600 MG tablet Take 600-1,200 mg by mouth See admin instructions. Take one tablet (600 mg) by mouth at morning and noon, take two tablets  (1200 mg) at bedtime   Yes [provider]  hydrochlorothiazide (HYDRODIURIL) 12.5 MG tablet Take 1 tablet (12.5 mg total) by mouth daily. 05/23/19  Yes Emokpae, Courage, MD  insulin glargine (LANTUS) 100 UNIT/ML injection Inject 20 Units into the skin at bedtime.   Yes [provider]  losartan (COZAAR) 50 MG tablet Take 50 mg by mouth daily. 02/09/21  Yes [provider]  metFORMIN (GLUCOPHAGE) 1000 MG tablet Take 1,000 mg by mouth 2 (two) times daily with a meal.    Yes [provider]  metoprolol tartrate (LOPRESSOR) 50 MG tablet Take 25 mg by mouth 2 (two) times daily.    Yes [provider]  pantoprazole (PROTONIX) 40 MG tablet Take 1 tablet (40 mg total) by mouth daily. 08/19/17  Yes Dhungel, Nishant, MD  Semaglutide,0.25 or 0.'5MG'$ /DOS, (OZEMPIC, 0.25 OR 0.5 MG/DOSE,) 2 MG/1.5ML SOPN Inject 0.25 mg into the skin every Sunday. Sunday night   Yes [provider]  vitamin B-12 (CYANOCOBALAMIN) 1000 MCG tablet Take 1,000 mcg by mouth daily.   Yes [provider]  acetaminophen (TYLENOL) 325 MG tablet Take 2 tablets (650 mg total) by mouth every 6 (six) hours as needed for mild pain, fever or headache (or Fever >/= 101). Patient not taking: Reported on 04/01/2021 05/23/19   Roxan Hockey, MD  albuterol (VENTOLIN HFA) 108 (90 Base) MCG/ACT inhaler Inhale 2 puffs into the lungs every 6 (six) hours as needed for wheezing or shortness of breath. Patient not taking: Reported on 04/01/2021 05/23/19   Roxan Hockey, MD    Inpatient Medications: Scheduled Meds:  acetaminophen  650 mg Oral Q6H   amLODipine  10 mg Oral Daily   atorvastatin  40 mg Oral q1800   ferrous sulfate  325 mg Oral TID   gabapentin  600 mg Oral TID   hydrochlorothiazide  12.5 mg Oral Daily   insulin aspart  0-15 Units Subcutaneous TID WC   insulin glargine-yfgn  10 Units Subcutaneous QHS   losartan  50 mg Oral Daily   metoprolol tartrate  25 mg Oral BID    morphine  injection  4 mg Intravenous Q4H   pantoprazole  40 mg Oral Daily   Continuous Infusions:  PRN Meds: albuterol  Allergies:   No Active Allergies  Social History:   Social History   Socioeconomic History   Marital status: Married    Spouse name: Not on file   Number of children: Not on file   Years of education: Not on file   Highest education level: Not on file  Occupational History   Not on file  Tobacco  Use   Smoking status: Former    Packs/day: 2.00    Years: 30.00    Pack years: 60.00    Types: Cigarettes    Quit date: 08/14/2000    Years since quitting: 20.6   Smokeless tobacco: Never  Vaping Use   Vaping Use: Never used  Substance and Sexual Activity   Alcohol use: No   Drug use: No   Sexual activity: Not on file  Other Topics Concern   Not on file  Social History Narrative   Not on file   Social Determinants of Health   Financial Resource Strain: Not on file  Food Insecurity: Not on file  Transportation Needs: Not on file  Physical Activity: Not on file  Stress: Not on file  Social Connections: Not on file  Intimate Partner Violence: Not on file    Family History:   Family History  Problem Relation Age of Onset   Heart attack Mother    Stroke Mother    Colon cancer Brother        late 28s   Family Status:  Family Status  Relation Name Status   Mother  Deceased   Father  Deceased   Brother  Alive    ROS:  Please see the history of present illness.  All other ROS reviewed and negative.     Physical Exam/Data:   Vitals:   04/01/21 2007 04/01/21 2041 04/02/21 0754 04/02/21 1202  BP:  (!) 161/74 (!) 167/71 115/65  Pulse:  97 72 72  Resp:  18    Temp: 98.9 F (37.2 C) 98.6 F (37 C) 97.6 F (36.4 C) 98.5 F (36.9 C)  TempSrc: Temporal Oral Oral Oral  SpO2:  98% 98% 98%  Weight:      Height:        Intake/Output Summary (Last 24 hours) at 04/02/2021 1240 Last data filed at 04/02/2021 0900 Gross per 24 hour  Intake 240 ml  Output  1100 ml  Net -860 ml   Filed Weights   04/01/21 1338  Weight: 64.4 kg   Body mass index is 22.24 kg/m.   General: Well developed, well nourished, NAD Neck: Negative for carotid bruits. No JVD Lungs:Clear to ausculation bilaterally. Breathing is unlabored. Cardiovascular: RRR with S1 S2. No murmurs Abdomen: Soft, non-tender, non-distended. No obvious abdominal masses. Extremities: No edema. Radial pulses 2+ bilaterally Neuro: Alert and oriented. No focal deficits. No facial asymmetry. MAE spontaneously. Psych: Responds to questions appropriately with normal affect.     EKG:  The EKG was personally reviewed and demonstrates:  04/01/21 ST with HR 101bpm and 1st degree AV block and no acute ischemic changes.  Telemetry:  Telemetry was personally reviewed and demonstrates:  04/02/21 NSR with rates in the 70-80s  Relevant CV Studies:  Echocardiogram 03/27/2018:  Study Conclusions   - Left ventricle: The cavity size was normal. There was mild focal    basal hypertrophy of the septum. Systolic function was normal.    The estimated ejection fraction was in the range of 55% to 60%.    Wall motion was normal; there were no regional wall motion    abnormalities. Left ventricular diastolic function parameters    were normal.  - Right ventricle: The RV is not well visualized but appears    dilated with reduced RVF.  - Pulmonary arteries: Systolic pressure could not be accurately    estimated.   LHC 10/11/2015:  Prox RCA lesion, 90% stenosed. Prox RCA to Mid RCA  lesion, 95% stenosed. Prox Cx to Mid Cx lesion, 80% stenosed. Ost LAD to Prox LAD lesion, 85% stenosed. Mid LAD lesion, 85% stenosed. 1st Diag lesion, 70% stenosed.   1. Severe 3 vessel obstructive CAD     Plan: CT surgery consult for CABG. Hold Plavix  Diagnostic Dominance: Right    Laboratory Data:  Chemistry Recent Labs  Lab 04/01/21 1507  NA 136  K 4.0  CL 99  CO2 20*  GLUCOSE 160*  BUN 16  CREATININE  1.24  CALCIUM 8.5*  GFRNONAA >60  ANIONGAP 17*    Total Protein  Date Value Ref Range Status  12/29/2020 7.1 6.0 - 8.5 g/dL Final   Albumin  Date Value Ref Range Status  12/29/2020 4.6 3.7 - 4.7 g/dL Final   AST  Date Value Ref Range Status  12/29/2020 28 0 - 40 IU/L Final   ALT  Date Value Ref Range Status  12/29/2020 29 0 - 44 IU/L Final   Alkaline Phosphatase  Date Value Ref Range Status  12/29/2020 83 44 - 121 IU/L Final   Bilirubin Total  Date Value Ref Range Status  12/29/2020 0.4 0.0 - 1.2 mg/dL Final   Hematology Recent Labs  Lab 04/01/21 1507 04/02/21 0024  WBC 13.4* 10.9*  RBC 4.47 4.43  HGB 13.1 13.1  HCT 41.1 39.1  MCV 91.9 88.3  MCH 29.3 29.6  MCHC 31.9 33.5  RDW 15.0 14.9  PLT 325 289   Cardiac EnzymesNo results for input(s): TROPONINI in the last 168 hours. No results for input(s): TROPIPOC in the last 168 hours.  BNPNo results for input(s): BNP, PROBNP in the last 168 hours.  DDimer No results for input(s): DDIMER in the last 168 hours. TSH:  Lab Results  Component Value Date   TSH 0.967 07/09/2015   Lipids: Lab Results  Component Value Date   CHOL 141 12/29/2020   HDL 32 (L) 12/29/2020   LDLCALC 84 12/29/2020   TRIG 141 12/29/2020   CHOLHDL 4.4 12/29/2020   HgbA1c: Lab Results  Component Value Date   HGBA1C 6.9 (H) 04/01/2021    Radiology/Studies:  DG Hip Unilat With Pelvis 2-3 Views Left  Result Date: 04/01/2021 CLINICAL DATA:  Fall today.  Left hip pain. EXAM: DG HIP (WITH OR WITHOUT PELVIS) 2-3V LEFT COMPARISON:  None. FINDINGS: Nondisplaced intertrochanteric left hip fracture seen. No evidence of dislocation. No pelvic fracture identified. Peripheral vascular calcification noted. IMPRESSION: Nondisplaced intertrochanteric left hip fracture. Electronically Signed   By: Marlaine Hind M.D.   On: 04/01/2021 15:15    Assessment and Plan:   1. Pre-operative cardiac clearance: -Pt presented to [facility] after suffering a mechanical  fall resulting in a left hip intertrochanteric fracture. Pt reports he has been in his usual state of health prior to hospital presentation when he lost his balance and suffered a mechanical fall. He was seen by ortho/trauma who have plans for surgical intervention 04/03/21.  -Followed with Dr. Gwenlyn Found for his cardiology care. PCI to LCx 2003. LHC 09/2015 revealed three-vessel disease and he ultimately underwent CABG by Dr. Roxan Hockey 10/18/2015 with a LIMA to his LAD, SVG to diagonal branch, obtuse marginal branches 1 and 2 and distal RCA.  -He had remote right iliac stenting by Dr. Gwenlyn Found with subsequent right to left femorofemoral crossover grafting by Dr. Amedeo Plenty back in the 1990s. Due to worsening claudication angiography performed 05/08/2016 showing a patent right to left femorofemoral crossover graft, patent right iliac stent, high-grade calcified right external iliac artery  stenosis and right common femoral artery stenosis proximal to the bypass graft takeoff, occluded SFAs bilaterally with high-grade proximal left anterior tibial artery stenosis and occluded left tibioperoneal trunk. He subsequently underwent endarterectomy and patch angioplasty of his calcified right common femoral artery as well as stenting of his iliac artery by Dr. Trula Slade. Pt with persistent claudication on Pletal.  -The patient does not have any unstable cardiac conditions. Upon evaluation today, he can achieve 4 METs or greater without anginal symptoms.  According to [facility] and AHA guidelines, he requires no further cardiac workup prior to his noncardiac surgery and should be at acceptable risk. His Revised Cardiac risk of peri-procedural MI or cardiac arrest following surgery is low to moderate due to prior hx.   -If needed, may hold hold Plavix, pletal and ASA prior to surgery and resume once ok from a surgical standpoint thereafter   2. CAD s/p CABG x 5 09/2015: -History of CAD s/p LCx stent 2003>> cardiac catheterization 10/11/2015  revealing three-vessel disease and he ultimately underwent CABG by Dr. Roxan Hockey 10/18/2015 with a LIMA to his LAD, vein to diagonal branch, obtuse marginal branches 1 and 2 and distal RCA.   -May hold antiplatelets prior to surgery if needed and resume once stable in the post-operative setting    3. PAD: -Known hx of peripheral arterial disease s/p remote stenting and most recently an endarterectomy and patch angioplasty of a calcified right common femoral artery as well as stenting of his iliac artery by Dr. Trula Slade. Persistent c/o of claudication on pletal  4. Hypertension: -Stable, 115/65 -Continue amlodipine, hydrochlorothiazide, losartan and metoprolol   5. HLD: -Last LDL, 56 from 09/2015 -Needs repeat lipid at follow up -Continue statin     For questions or updates, please contact [place] Please consult [email or web address] for contact info under Cardiology/STEMI.   Anthony Safe NP-C [facility] Pager: [phone] 04/02/2021 12:40 PM   Attending Note:   The patient was seen and examined.  Agree with assessment and plan as noted above.  Changes made to the above note as needed.  Patient seen and independently examined with Kathyrn Drown, NP .   We discussed all aspects of the encounter. I agree with the assessment and plan as stated above.     Pre - Op assessment prior to hip surgery .  Pt has a hx of CAD and PAD .  He denies any angian .  He does have some claudication  .   He has been very stable for quite a while  He is at low risk for his upcoming hip surgery . OK to hold Pletal, ASA , and Plavix prior to surgery .  Restart soon after surgery - per Ortho team   We will see him following surgery   2. CAD :   no recent angina   3. HTN   BP is stable     I have spent a total of 40 minutes with patient reviewing hospital  notes , telemetry, EKGs, labs and examining patient as well as establishing an assessment and plan that was discussed with the patient.   > 50% of time was spent in direct patient care.    Thayer Headings, Brooke Bonito., MD, [facility] 04/02/2021, 3:24 PM [id number] N[address],  [place] Pager [phone]

## 2021-04-03 ENCOUNTER — Encounter (HOSPITAL_COMMUNITY): Payer: Self-pay | Admitting: Student

## 2021-04-03 ENCOUNTER — Encounter (HOSPITAL_COMMUNITY)
Admission: EM | Disposition: A | Discharge status: Home Health | Payer: Self-pay | Source: Home / Self Care | Attending: Family Medicine

## 2021-04-03 ENCOUNTER — Inpatient Hospital Stay (HOSPITAL_COMMUNITY): Payer: No Typology Code available for payment source

## 2021-04-03 ENCOUNTER — Inpatient Hospital Stay (HOSPITAL_COMMUNITY): Payer: No Typology Code available for payment source | Admitting: Certified Registered Nurse Anesthetist

## 2021-04-03 DIAGNOSIS — S72002A Fracture of unspecified part of neck of left femur, initial encounter for closed fracture: Secondary | ICD-10-CM | POA: Diagnosis not present

## 2021-04-03 DIAGNOSIS — S72145A Nondisplaced intertrochanteric fracture of left femur, initial encounter for closed fracture: Secondary | ICD-10-CM

## 2021-04-03 DIAGNOSIS — W19XXXA Unspecified fall, initial encounter: Secondary | ICD-10-CM | POA: Diagnosis not present

## 2021-04-03 HISTORY — PX: INTRAMEDULLARY (IM) NAIL INTERTROCHANTERIC: SHX5875

## 2021-04-03 LAB — CBC
HCT: 42 % (ref 39.0–52.0)
Hemoglobin: 14.1 g/dL (ref 13.0–17.0)
MCH: 29.8 pg (ref 26.0–34.0)
MCHC: 33.6 g/dL (ref 30.0–36.0)
MCV: 88.8 fL (ref 80.0–100.0)
Platelets: 277 10*3/uL (ref 150–400)
RBC: 4.73 MIL/uL (ref 4.22–5.81)
RDW: 15.1 % (ref 11.5–15.5)
WBC: 12.6 10*3/uL — ABNORMAL HIGH (ref 4.0–10.5)
nRBC: 0 % (ref 0.0–0.2)

## 2021-04-03 LAB — GLUCOSE, CAPILLARY
Glucose-Capillary: 153 mg/dL — ABNORMAL HIGH (ref 70–99)
Glucose-Capillary: 137 mg/dL — ABNORMAL HIGH (ref 70–99)
Glucose-Capillary: 104 mg/dL — ABNORMAL HIGH (ref 70–99)
Glucose-Capillary: 219 mg/dL — ABNORMAL HIGH (ref 70–99)
Glucose-Capillary: 191 mg/dL — ABNORMAL HIGH (ref 70–99)

## 2021-04-03 LAB — BASIC METABOLIC PANEL
Anion gap: 12 (ref 5–15)
BUN: 19 mg/dL (ref 8–23)
CO2: 24 mmol/L (ref 22–32)
Calcium: 9.1 mg/dL (ref 8.9–10.3)
Chloride: 97 mmol/L — ABNORMAL LOW (ref 98–111)
Creatinine, Ser: 1.29 mg/dL — ABNORMAL HIGH (ref 0.61–1.24)
GFR, Estimated: 59 mL/min — ABNORMAL LOW (ref >60)
Glucose, Bld: 149 mg/dL — ABNORMAL HIGH (ref 70–99)
Potassium: 3.7 mmol/L (ref 3.5–5.1)
Sodium: 133 mmol/L — ABNORMAL LOW (ref 135–145)

## 2021-04-03 LAB — SURGICAL PCR SCREEN
MRSA, PCR: NEGATIVE
Staphylococcus aureus: POSITIVE — AB

## 2021-04-03 SURGERY — FIXATION, FRACTURE, INTERTROCHANTERIC, WITH INTRAMEDULLARY ROD
Anesthesia: General | Site: Hip | Laterality: Left

## 2021-04-03 MED ORDER — PROPOFOL 10 MG/ML IV BOLUS
INTRAVENOUS | Status: AC
  Filled 2021-04-03: qty 20

## 2021-04-03 MED ORDER — ORAL CARE MOUTH RINSE: 15.0000 mL | Freq: Once | OROMUCOSAL | Status: DC

## 2021-04-03 MED ORDER — LIDOCAINE 2% (20 MG/ML) 5 ML SYRINGE
INTRAMUSCULAR | Status: AC
  Filled 2021-04-03: qty 5

## 2021-04-03 MED ORDER — DEXAMETHASONE SODIUM PHOSPHATE 10 MG/ML IJ SOLN
INTRAMUSCULAR | Status: DC | PRN
  Administered 2021-04-03: 5 mg via INTRAVENOUS

## 2021-04-03 MED ORDER — CLOPIDOGREL BISULFATE 75 MG PO TABS
75.0000 mg | ORAL_TABLET | Freq: Every day | ORAL | Status: DC
  Administered 2021-04-04 – 2021-04-06 (×3): 75 mg via ORAL
  Filled 2021-04-03 (×4): qty 1

## 2021-04-03 MED ORDER — PROPOFOL 10 MG/ML IV BOLUS
INTRAVENOUS | Status: DC | PRN
  Administered 2021-04-03: 100 mg via INTRAVENOUS

## 2021-04-03 MED ORDER — DOCUSATE SODIUM 100 MG PO CAPS
100.0000 mg | ORAL_CAPSULE | Freq: Two times a day (BID) | ORAL | Status: DC
  Administered 2021-04-03 – 2021-04-06 (×6): 100 mg via ORAL
  Filled 2021-04-03 (×6): qty 1

## 2021-04-03 MED ORDER — PHENYLEPHRINE 40 MCG/ML (10ML) SYRINGE FOR IV PUSH (FOR BLOOD PRESSURE SUPPORT)
PREFILLED_SYRINGE | INTRAVENOUS | Status: AC
  Filled 2021-04-03: qty 10

## 2021-04-03 MED ORDER — DEXAMETHASONE SODIUM PHOSPHATE 10 MG/ML IJ SOLN
INTRAMUSCULAR | Status: AC
  Filled 2021-04-03: qty 1

## 2021-04-03 MED ORDER — ACETAMINOPHEN 325 MG PO TABS: 325.0000 mg | ORAL_TABLET | Freq: Four times a day (QID) | ORAL | Status: DC | PRN

## 2021-04-03 MED ORDER — ONDANSETRON HCL 4 MG/2ML IJ SOLN
INTRAMUSCULAR | Status: DC | PRN
  Administered 2021-04-03: 4 mg via INTRAVENOUS

## 2021-04-03 MED ORDER — CEFAZOLIN SODIUM-DEXTROSE 2-4 GM/100ML-% IV SOLN
2.0000 g | Freq: Once | INTRAVENOUS | Status: AC
  Administered 2021-04-03: 2 g via INTRAVENOUS

## 2021-04-03 MED ORDER — METOCLOPRAMIDE HCL 5 MG/ML IJ SOLN: 5.0000 mg | Freq: Three times a day (TID) | INTRAMUSCULAR | Status: DC | PRN

## 2021-04-03 MED ORDER — ONDANSETRON HCL 4 MG PO TABS: 4.0000 mg | ORAL_TABLET | Freq: Four times a day (QID) | ORAL | Status: DC | PRN

## 2021-04-03 MED ORDER — OXYCODONE HCL 5 MG PO TABS
5.0000 mg | ORAL_TABLET | ORAL | Status: DC | PRN
  Administered 2021-04-03: 5 mg via ORAL
  Filled 2021-04-03: qty 1

## 2021-04-03 MED ORDER — METHOCARBAMOL 500 MG PO TABS: 500.0000 mg | ORAL_TABLET | Freq: Four times a day (QID) | ORAL | Status: DC | PRN

## 2021-04-03 MED ORDER — ONDANSETRON HCL 4 MG/2ML IJ SOLN: 4.0000 mg | Freq: Four times a day (QID) | INTRAMUSCULAR | Status: DC | PRN

## 2021-04-03 MED ORDER — PROMETHAZINE HCL 25 MG/ML IJ SOLN: 6.2500 mg | INTRAMUSCULAR | Status: DC | PRN

## 2021-04-03 MED ORDER — ALUM & MAG HYDROXIDE-SIMETH 200-200-20 MG/5ML PO SUSP: 30.0000 mL | ORAL | Status: DC | PRN

## 2021-04-03 MED ORDER — PHENOL 1.4 % MT LIQD: 1.0000 | OROMUCOSAL | Status: DC | PRN

## 2021-04-03 MED ORDER — HYDROMORPHONE HCL 1 MG/ML IJ SOLN: 0.5000 mg | INTRAMUSCULAR | Status: DC | PRN

## 2021-04-03 MED ORDER — CHLORHEXIDINE GLUCONATE 0.12 % MT SOLN: 15.0000 mL | Freq: Once | OROMUCOSAL | Status: DC

## 2021-04-03 MED ORDER — PHENYLEPHRINE HCL-NACL 20-0.9 MG/250ML-% IV SOLN
INTRAVENOUS | Status: DC | PRN
  Administered 2021-04-03: 25 ug/min via INTRAVENOUS

## 2021-04-03 MED ORDER — CEFAZOLIN SODIUM-DEXTROSE 2-4 GM/100ML-% IV SOLN
INTRAVENOUS | Status: AC
  Filled 2021-04-03: qty 100

## 2021-04-03 MED ORDER — LACTATED RINGERS IV SOLN: INTRAVENOUS | Status: DC

## 2021-04-03 MED ORDER — 0.9 % SODIUM CHLORIDE (POUR BTL) OPTIME
TOPICAL | Status: DC | PRN
  Administered 2021-04-03: 1000 mL

## 2021-04-03 MED ORDER — ASPIRIN EC 81 MG PO TBEC: 81.0000 mg | DELAYED_RELEASE_TABLET | Freq: Every day | ORAL | Status: DC

## 2021-04-03 MED ORDER — PHENYLEPHRINE 40 MCG/ML (10ML) SYRINGE FOR IV PUSH (FOR BLOOD PRESSURE SUPPORT)
PREFILLED_SYRINGE | INTRAVENOUS | Status: DC | PRN
  Administered 2021-04-03: 120 ug via INTRAVENOUS
  Administered 2021-04-03 (×2): 80 ug via INTRAVENOUS
  Administered 2021-04-03: 40 ug via INTRAVENOUS

## 2021-04-03 MED ORDER — METOCLOPRAMIDE HCL 5 MG PO TABS: 5.0000 mg | ORAL_TABLET | Freq: Three times a day (TID) | ORAL | Status: DC | PRN

## 2021-04-03 MED ORDER — ROCURONIUM BROMIDE 10 MG/ML (PF) SYRINGE
PREFILLED_SYRINGE | INTRAVENOUS | Status: AC
  Filled 2021-04-03: qty 10

## 2021-04-03 MED ORDER — ASPIRIN EC 81 MG PO TBEC
81.0000 mg | DELAYED_RELEASE_TABLET | Freq: Every day | ORAL | Status: DC
  Administered 2021-04-04 – 2021-04-06 (×3): 81 mg via ORAL
  Filled 2021-04-03 (×3): qty 1

## 2021-04-03 MED ORDER — OXYCODONE HCL 5 MG PO TABS: 5.0000 mg | ORAL_TABLET | ORAL | Status: DC | PRN

## 2021-04-03 MED ORDER — ACETAMINOPHEN 10 MG/ML IV SOLN: 1000.0000 mg | Freq: Once | INTRAVENOUS | Status: DC | PRN

## 2021-04-03 MED ORDER — PANTOPRAZOLE SODIUM 40 MG PO TBEC
40.0000 mg | DELAYED_RELEASE_TABLET | Freq: Every day | ORAL | Status: DC
  Administered 2021-04-03 – 2021-04-06 (×4): 40 mg via ORAL
  Filled 2021-04-03 (×2): qty 1

## 2021-04-03 MED ORDER — TRANEXAMIC ACID-NACL 1000-0.7 MG/100ML-% IV SOLN
1000.0000 mg | Freq: Once | INTRAVENOUS | Status: AC
  Administered 2021-04-03: 1000 mg via INTRAVENOUS
  Filled 2021-04-03: qty 100

## 2021-04-03 MED ORDER — CHLORHEXIDINE GLUCONATE 0.12 % MT SOLN
OROMUCOSAL | Status: AC
  Filled 2021-04-03: qty 15

## 2021-04-03 MED ORDER — ONDANSETRON HCL 4 MG/2ML IJ SOLN
INTRAMUSCULAR | Status: AC
  Filled 2021-04-03: qty 2

## 2021-04-03 MED ORDER — MENTHOL 3 MG MT LOZG: 1.0000 | LOZENGE | OROMUCOSAL | Status: DC | PRN

## 2021-04-03 MED ORDER — SUGAMMADEX SODIUM 200 MG/2ML IV SOLN
INTRAVENOUS | Status: DC | PRN
  Administered 2021-04-03: 200 mg via INTRAVENOUS

## 2021-04-03 MED ORDER — FENTANYL CITRATE (PF) 250 MCG/5ML IJ SOLN
INTRAMUSCULAR | Status: AC
  Filled 2021-04-03: qty 5

## 2021-04-03 MED ORDER — METHOCARBAMOL 1000 MG/10ML IJ SOLN
500.0000 mg | Freq: Four times a day (QID) | INTRAVENOUS | Status: DC | PRN
  Filled 2021-04-03: qty 5

## 2021-04-03 MED ORDER — ROCURONIUM BROMIDE 10 MG/ML (PF) SYRINGE
PREFILLED_SYRINGE | INTRAVENOUS | Status: DC | PRN
  Administered 2021-04-03: 60 mg via INTRAVENOUS

## 2021-04-03 MED ORDER — MIDAZOLAM HCL 2 MG/2ML IJ SOLN
INTRAMUSCULAR | Status: DC | PRN
  Administered 2021-04-03: 1 mg via INTRAVENOUS

## 2021-04-03 MED ORDER — LIDOCAINE 2% (20 MG/ML) 5 ML SYRINGE
INTRAMUSCULAR | Status: DC | PRN
Start: 2021-04-03 — End: 2021-04-03
  Administered 2021-04-03: 40 mg via INTRAVENOUS

## 2021-04-03 MED ORDER — CEFAZOLIN SODIUM-DEXTROSE 2-4 GM/100ML-% IV SOLN
2.0000 g | Freq: Four times a day (QID) | INTRAVENOUS | Status: AC
Start: 2021-04-03 — End: 2021-04-03
  Administered 2021-04-03 (×2): 2 g via INTRAVENOUS
  Filled 2021-04-03 (×3): qty 100

## 2021-04-03 MED ORDER — MIDAZOLAM HCL 2 MG/2ML IJ SOLN
INTRAMUSCULAR | Status: AC
  Filled 2021-04-03: qty 2

## 2021-04-03 MED ORDER — FENTANYL CITRATE (PF) 100 MCG/2ML IJ SOLN: 25.0000 ug | INTRAMUSCULAR | Status: DC | PRN

## 2021-04-03 MED ORDER — FENTANYL CITRATE (PF) 250 MCG/5ML IJ SOLN
INTRAMUSCULAR | Status: DC | PRN
  Administered 2021-04-03 (×3): 50 ug via INTRAVENOUS

## 2021-04-03 MED ORDER — CLOPIDOGREL BISULFATE 75 MG PO TABS: 75.0000 mg | ORAL_TABLET | Freq: Every day | ORAL | Status: DC

## 2021-04-03 MED ORDER — OXYCODONE HCL 5 MG PO TABS: 10.0000 mg | ORAL_TABLET | ORAL | Status: DC | PRN

## 2021-04-03 MED ORDER — HYDROMORPHONE HCL 1 MG/ML IJ SOLN
0.5000 mg | Freq: Once | INTRAMUSCULAR | Status: AC
  Administered 2021-04-03: 0.5 mg via INTRAVENOUS
  Filled 2021-04-03: qty 0.5

## 2021-04-03 MED ORDER — CILOSTAZOL 100 MG PO TABS
100.0000 mg | ORAL_TABLET | Freq: Two times a day (BID) | ORAL | Status: DC
  Administered 2021-04-04 – 2021-04-06 (×4): 100 mg via ORAL
  Filled 2021-04-03 (×5): qty 1

## 2021-04-03 SURGICAL SUPPLY — 41 items
BIT DRILL INTERTAN LAG SCREW (BIT) ×1 IMPLANT
BNDG GAUZE ELAST 4 BULKY (GAUZE/BANDAGES/DRESSINGS) ×2 IMPLANT
COVER PERINEAL POST (MISCELLANEOUS) ×2 IMPLANT
COVER SURGICAL LIGHT HANDLE (MISCELLANEOUS) ×2 IMPLANT
DRAPE STERI IOBAN 125X83 (DRAPES) ×2 IMPLANT
DRSG AQUACEL ADVANTAGE 4X5 (GAUZE/BANDAGES/DRESSINGS) IMPLANT
DRSG AQUACEL AG ADV 3.5X 6 (GAUZE/BANDAGES/DRESSINGS) ×2 IMPLANT
DRSG MEPILEX BORDER 4X4 (GAUZE/BANDAGES/DRESSINGS) IMPLANT
DRSG MEPILEX BORDER 4X8 (GAUZE/BANDAGES/DRESSINGS) ×2 IMPLANT
DRSG PAD ABDOMINAL 8X10 ST (GAUZE/BANDAGES/DRESSINGS) ×4 IMPLANT
DURAPREP 26ML APPLICATOR (WOUND CARE) ×2 IMPLANT
ELECT REM PT RETURN 9FT ADLT (ELECTROSURGICAL) ×2
ELECTRODE REM PT RTRN 9FT ADLT (ELECTROSURGICAL) ×1 IMPLANT
FACESHIELD WRAPAROUND (MASK) ×2 IMPLANT
FACESHIELD WRAPAROUND OR TEAM (MASK) ×1 IMPLANT
GAUZE XEROFORM 5X9 LF (GAUZE/BANDAGES/DRESSINGS) ×2 IMPLANT
GLOVE SRG 8 PF TXTR STRL LF DI (GLOVE) ×1 IMPLANT
GLOVE SURG ENC MOIS LTX SZ8 (GLOVE) ×2 IMPLANT
GLOVE SURG ORTHO LTX SZ7.5 (GLOVE) ×2 IMPLANT
GLOVE SURG UNDER POLY LF SZ8 (GLOVE) ×2
GOWN STRL REUS W/ TWL LRG LVL3 (GOWN DISPOSABLE) ×1 IMPLANT
GOWN STRL REUS W/ TWL XL LVL3 (GOWN DISPOSABLE) ×2 IMPLANT
GOWN STRL REUS W/TWL LRG LVL3 (GOWN DISPOSABLE) ×2
GOWN STRL REUS W/TWL XL LVL3 (GOWN DISPOSABLE) ×4
GUIDE PIN 3.2X343 (PIN) ×2
GUIDE PIN 3.2X343MM (PIN) ×4
KIT BASIN OR (CUSTOM PROCEDURE TRAY) ×2 IMPLANT
KIT TURNOVER KIT B (KITS) ×2 IMPLANT
NAIL LEFT 10X36 (Nail) ×1 IMPLANT
NS IRRIG 1000ML POUR BTL (IV SOLUTION) ×2 IMPLANT
PACK GENERAL/GYN (CUSTOM PROCEDURE TRAY) ×2 IMPLANT
PAD ARMBOARD 7.5X6 YLW CONV (MISCELLANEOUS) ×4 IMPLANT
PIN GUIDE 3.2X343MM (PIN) IMPLANT
SCREW LAG COMPR KIT 95/90 (Screw) ×1 IMPLANT
STAPLER VISISTAT 35W (STAPLE) ×2 IMPLANT
SUT VIC AB 0 CT1 27 (SUTURE) ×4
SUT VIC AB 0 CT1 27XBRD ANBCTR (SUTURE) ×2 IMPLANT
SUT VIC AB 2-0 CT1 27 (SUTURE) ×4
SUT VIC AB 2-0 CT1 TAPERPNT 27 (SUTURE) ×2 IMPLANT
TOWEL GREEN STERILE (TOWEL DISPOSABLE) ×2 IMPLANT
TOWEL GREEN STERILE FF (TOWEL DISPOSABLE) ×2 IMPLANT

## 2021-04-03 NOTE — Progress Notes (Addendum)
FPTS Interim Progress Note  S:Night rounds.  Patient's room is dark and he was sleeping comfortably in bed.  I did not wake this patient.  Talked to nurse tech at station, she was unaware of any complaints or concerns at this time.  Nurse was in another patient's room, so I did not speak to her in person.  O: BP 132/68 (BP Location: Left Arm)   Pulse 74   Temp 98.6 F (37 C) (Oral)   Resp 16   Ht '5\' 7"'$  (1.702 m)   Wt 64.4 kg   SpO2 96%   BMI 22.24 kg/m    A/P: Left hip fracture Plan for OR at 9:30 AM in OR for with Dr. Ninfa Linden for left hip intramedullary-intertrochanteric nail placement.   -Patient has active n.p.o. at midnight order.   -Continue scheduled morphine, gabapentin, Tylenol.   -Placed PT and OT eval and treat orders to start Sun 8/21 at 2pm  CAD s/p CABG, PAD Plan to restart home aspirin, Plavix, and pletal postop.  Reordered these home meds to start Sunday 8/21 at 10 PM.  Hypertension Bedtime BP 132/68.  Continue losartan, HCTZ, amlodipine, metoprolol.  No changes to management at this time.  T2DM Bedtime glucose 147, received 10 units of insulin glargine.  No changes to management at this time.   Ezequiel Essex, MD 04/03/2021, 1:20 AM PGY-2, [place] [facility] pager [phone]

## 2021-04-03 NOTE — Progress Notes (Signed)
Report given to short stay RN. Patient Aox4, in route to pre-op

## 2021-04-03 NOTE — Progress Notes (Signed)
Interim Progress Note  S: In to check on patient after surgery. He is accompanied by his wife at the bedside. He feels well. States he was able to stand up with physical therapy. He feels he will recover from this and is hopeful to not have any more falls in the future. His wife is inquiring about what kind of support he will need at home. He feels comfortable at this time, not in pain.   O:  Blood pressure 129/65, pulse 81, temperature 97.7 F (36.5 C), resp. rate 20, height '5\' 7"'$  (1.702 m), weight 64.4 kg, SpO2 92 %. Gen: Awake, appears stated age, in no distress, pleasant Cardio: RRR Resp: CTAB, breathing comfortably on room air, in no respiratory distress  MSK: bandage to lateral left thigh. Scant ecchymosis to LLQ. No active bleeding noted.   A/P:  [name] is a 75 y.o. male who presented with left hip intertrochanteric fracture now post-op and recovering well. He requrested a dose of Dilaudid prior to working with physical therapy and appeared to do well with PT. He is recommended from [facility] PT with some DME orders for home. His pain is well controlled at this time. -Continue PT treatment -Will await OT eval and treatment -Pain regimen: Scheduled Tylenol 650 mg q6h (can increase if needed),  Oxy IR 5 mg q4h PRN moderate to severe pain.  -Plan to restart triple anti-platelet therapy tomorrow, 8/22 (ASA, Plavix and Pletal)

## 2021-04-03 NOTE — Plan of Care (Signed)
Patient AOX4. Pain controlled. More awake and eating. VSS. Voiding. Call bell within reach. Bed alarm on.   Problem: Elimination: Goal: Will not experience complications related to bowel motility Outcome: Progressing Goal: Will not experience complications related to urinary retention Outcome: Progressing   Problem: Pain Managment: Goal: General experience of comfort will improve Outcome: Progressing   Problem: Safety: Goal: Ability to remain free from injury will improve Outcome: Progressing   Problem: Skin Integrity: Goal: Risk for impaired skin integrity will decrease Outcome: Progressing

## 2021-04-03 NOTE — Progress Notes (Signed)
Patient arrived back to unit from PACU around 1200. Aox3, confused about situation. Minimal pain. Drsg intact, ice pack in place. VSS. Tele monitor on. Family at bedside.

## 2021-04-03 NOTE — Progress Notes (Signed)
Physical Therapy Note  PT eval complete with full note to follow;  Recommend Home with 24 hour assist for transition out of hospital;  Will need RW and 3in1 Consider HHPT for follow up;   Thank you,   Roney Marion, [place] Pager [phone] Office [phone]

## 2021-04-03 NOTE — Progress Notes (Signed)
FPTS Brief Progress Note  S Saw patient at bedside this evening. Patient was resting comfortably. Pain is fairly well controlled oxycodone and is requesting more oxycodone now. No concerns from night RN.   O: BP 129/65 (BP Location: Left Arm)   Pulse 81   Temp 97.7 F (36.5 C)   Resp 20   Ht '5\' 7"'$  (1.702 m)   Wt 64.4 kg   SpO2 92%   BMI 22.24 kg/m    General: Alert, no acute distress Extremities: left hip dressings dry and clean   A/P: Plan per day team  -Restart ASA, plavix and pletal from tomorrow -For pain: tylenol '650mg'$  Q6H, oxycodone '5mg'$  Q4HPRN - Orders reviewed. Labs for AM ordered, which was adjusted as needed.   Lattie Haw, MD 04/03/2021, 10:08 PM PGY-3, [facility] Night Resident  Please page [phone] with questions.

## 2021-04-03 NOTE — Anesthesia Procedure Notes (Signed)
Procedure Name: Intubation Date/Time: 04/03/2021 9:49 AM Performed by: Carolan Clines, CRNA Pre-anesthesia Checklist: Patient identified, Emergency Drugs available, Suction available and Patient being monitored Patient Re-evaluated:Patient Re-evaluated prior to induction Oxygen Delivery Method: Circle System Utilized Preoxygenation: Pre-oxygenation with 100% oxygen Induction Type: IV induction Ventilation: Mask ventilation without difficulty and Oral airway inserted - appropriate to patient size Laryngoscope Size: Mac and 4 Grade View: Grade II Tube type: Oral Tube size: 7.5 mm Number of attempts: 1 Airway Equipment and Method: Stylet and Oral airway Placement Confirmation: ETT inserted through vocal cords under direct vision, positive ETCO2 and breath sounds checked- equal and bilateral Secured at: 22 cm Tube secured with: Tape Dental Injury: Teeth and Oropharynx as per pre-operative assessment

## 2021-04-03 NOTE — Brief Op Note (Signed)
04/01/2021 - 04/03/2021  10:58 AM  PATIENT:  [name]  75 y.o. male  PRE-OPERATIVE DIAGNOSIS:  Left hip intertroch fx  POST-OPERATIVE DIAGNOSIS:  Left hip intertroch fx  PROCEDURE:  Procedure(s): LEFT  HIP INTRAMEDULLARY (IM) NAIL INTERTROCHANTRIC (Left)  SURGEON:  Surgeon(s) and Role:    Mcarthur Rossetti, MD - Primary  PHYSICIAN ASSISTANT:  Benita Stabile, PA-C  ANESTHESIA:   general  COUNTS:  YES  PLAN OF CARE: Admit to inpatient   PATIENT DISPOSITION:  PACU - hemodynamically stable.   Delay start of Pharmacological VTE agent (>24hrs) due to surgical blood loss or risk of bleeding: no

## 2021-04-03 NOTE — Progress Notes (Signed)
PT Cancellation Note  Patient Details Name: [name] MRN: [id number] DOB: 23-Oct-1945   Cancelled Treatment:     Plan for fixation of intertrochanteric fx this morning;   Will follow up later today as time allows;  Otherwise, will follow up for PT eval tomorrow;   Thank you,  Roney Marion, PT  Acute Rehabilitation Services Pager [phone] Office [phone]    Colletta [place] 04/03/2021, 8:09 AM

## 2021-04-03 NOTE — Op Note (Signed)
NAME: [name], [name] RECORD NO: [id number] ACCOUNT NO: [id number] DATE OF BIRTH: November 07, 1945 FACILITY: [facility] LOCATION: [facility] PHYSICIAN: Lind Guest. Ninfa Linden, MD  Operative Report   DATE OF PROCEDURE: 04/03/2021  PREOPERATIVE DIAGNOSIS:  Left hip displaced intertrochanteric proximal femur fracture.  POSTOPERATIVE DIAGNOSIS:  Left hip displaced intertrochanteric proximal femur fracture.  PROCEDURE:  Open reduction/internal fixation of left intertrochanteric hip fracture using a cephalomedullary nail and hip screw construct.  IMPLANTS:  [employer] Intertan 10 x 360 femoral nail (125 degree) with a 95/90 interdigitating lag screw compression screw construct.  SURGEON:  Lind Guest. Ninfa Linden, MD,  ASSISTANT:  Erskine Emery, PA-C  ANESTHESIA:  General.  ANTIBIOTICS:  2 g IV Ancef.  BLOOD LOSS:  123XX123 mL  COMPLICATIONS:  None.  INDICATIONS:  The patient is a 75 year old gentleman who sustained a mechanical fall two days ago injuring his left hip.  We had to delay surgery for 2 days given the fact that he had been on Eliquis as a blood thinner.  He has an intertrochanteric hip  fracture with displacement.  I recommended open reduction and internal fixation of this fracture using a cephalomedullary nail and hip screw construct.  I described the rationale of this to the patient's family as well as the risks and benefits of  surgery.  He does wish to proceed.  DESCRIPTION OF PROCEDURE:  After informed consent was obtained, appropriate left hip was marked, he was brought to the operating room and general anesthesia was obtained, while he was on a stretcher.  He was then placed supine on the fracture table with  his left operative leg in line skeletal traction with a traction boot, the perineal post in place and the right leg in a well-padded, well leg holder.  We then assessed his fracture radiographically and was able to pull some traction and internally  rotate the fracture  and reduce it anatomically.  We then chose our nail sterilely in the box under fluoroscopic guidance, we could get our diameter and length of the nail, which was a 10 x 360 left and 125-degree cephalomedullary nail from [employer], which was the Intertan nail.  This was passed off sterilely to the back table.  We then prepped his left hip area with DuraPrep and sterile drapes.  A timeout was called and he was identified as correct patient, correct left hip.  I then made an  incision just proximal to the greater trochanter and dissected down at the tip of the greater trochanter.  A temporary guide pin was then placed under direct fluoroscopy in an antegrade fashion from the tip of the greater trochanter to just past the  lesser trochanter.  This was verified in AP and lateral planes.  I then used initiating reamer to open up the femoral canal and removed the guide pin.  We then placed our 10 x 360 left Intertan femoral nail down the canal without needing to ream.  Again,  its placement was verified under direct fluoroscopy.  We then made a separate lateral incision using the outrigger guide and put a temporary guide pin from the lateral cortex of the femur traversing the fracture and the femoral neck and head area.  We  put this into position just inferior and posterior with better bone quality.  We took measurements off of this and we chose our 95/90 lag screw compression screw construct.  I then drilled to the depth of both of those and placed both those screws  without difficulty.  We then let some traction off the bed and we were able to compress the fracture.  We then removed all instrumentation.  We verified the placement using the c-arm through internal and external rotation of the femur and looking at the hip from AP  to lateral planes under fluoroscopy.  We then irrigated both wounds with normal saline solution and closed deep tissue with 0 Vicryl, followed by 2-0 Vicryl in subcutaneous tissue  and interrupted staples on the skin incisions.  Aquacel dressing was  applied.  He was taken off the fracture table, awakened, extubated, and taken to recovery room in stable condition with all final counts being correct.  No complications noted.   Of note, Benita Stabile, PA-C, did assist during the entire case and assistance was crucial for facilitating all aspects of this case.   PAA D: 04/03/2021 10:56:52 am T: 04/03/2021 10:43:00 pm  JOB: [id number]

## 2021-04-03 NOTE — Anesthesia Postprocedure Evaluation (Signed)
Anesthesia Post Note  Patient: [name]  Procedure(s) Performed: LEFT  HIP INTRAMEDULLARY (IM) NAIL INTERTROCHANTRIC (Left: Hip)     Patient location during evaluation: PACU Anesthesia Type: General Level of consciousness: awake and alert Pain management: pain level controlled Vital Signs Assessment: post-procedure vital signs reviewed and stable Respiratory status: spontaneous breathing, nonlabored ventilation, respiratory function stable and patient connected to nasal cannula oxygen Cardiovascular status: blood pressure returned to baseline and stable Postop Assessment: no apparent nausea or vomiting Anesthetic complications: no   No notable events documented.  Last Vitals:  Vitals:   04/03/21 1421 04/03/21 1433  BP: 130/61 129/65  Pulse: 74 81  Resp: 18 20  Temp: 36.5 C 36.5 C  SpO2: 93% 92%    Last Pain:  Vitals:   04/03/21 1421  TempSrc: Oral  PainSc:                  [name] [name]

## 2021-04-03 NOTE — Progress Notes (Signed)
Family Medicine Teaching Service Daily Progress Note Intern Pager: [phone]  Patient name: [name] Medical record number: [id number] Date of birth: February 05, 1946 Age: 75 y.o. Gender: male  Primary Care Provider: [name], MD Consultants: Orthopedics, cardiology Code Status: Full  Pt Overview and Major Events to Date:  8/19: Admitted, antiplatelet therapies held 8/21: OR with Ortho for pin placement  Assessment and Plan: [name] is a 75 year old male who presented with left hip fracture s/p fall.  Awaiting OR today at 930.  PMH includes extensive CAD s/p CABG x5, CKD stage III, diverticulitis, HTN, GERD, HLD, malnutrition, PAD, PVD, T2DM, diabetic neuropathy  Left hip fracture Plan for OR today at 930 with Ortho Dr. Ninfa Linden.  Patient reports current pain regimen working well, no complaints at this time. - Currently n.p.o., may have diet after procedure - Continue scheduled morphine, gabapentin, Tylenol - PT/OT eval and treat to start this afternoon at 2 PM  CAD s/p CABG x5, PAD, PVD Cardiology cleared to restart home aspirin, Plavix, Pletal postop.  Have been held since 8/19 on admission.  I have reordered these medications to start tonight 8/21 at 10 PM.  Hypertension Overnight BP within goal range. Continue losartan, HCTZ, amlodipine, metoprolol.  No changes to management at this time.  T2DM Bedtime glucose 147, received 10 units of insulin glargine.  A.m. glucose 149/153. No changes to management at this time.  We will start back carb modified heart healthy diet after operation.  HLD Continue home atorvastatin 40 mg.  FEN/GI: N.p.o. preop, heart healthy carb modified postop PPx: SCDs Dispo:Pending PT recommendations  in 2-3 days. Barriers include surgery today, postop progression.   Subjective:  Patient laying in bed this morning awake and alert.  No acute distress.  Patient reports he slept well and has no complaints at this time.  Reports pain is well controlled  with pain regimen.  Discussed PT OT this afternoon and restarting antiplatelet medications this evening.  Objective: Temp:  [97.6 F (36.4 C)-98.6 F (37 C)] 98.6 F (37 C) (08/20 2056) Pulse Rate:  [72-76] 74 (08/20 2056) Resp:  [16-18] 16 (08/20 2056) BP: (115-167)/(65-71) 132/68 (08/20 2056) SpO2:  [95 %-98 %] 96 % (08/20 2056) Physical Exam: General: Awake, alert, no acute distress Cardiovascular: Regular rate and rhythm, no murmurs appreciated Respiratory: Clear to auscultation bilaterally Abdomen: Nondistended Extremities: SCDs in place on right  Laboratory: Recent Labs  Lab 04/01/21 1507 04/02/21 0024 04/03/21 0245  WBC 13.4* 10.9* 12.6*  HGB 13.1 13.1 14.1  HCT 41.1 39.1 42.0  PLT 325 289 277   Recent Labs  Lab 04/01/21 1507 04/03/21 0245  NA 136 133*  K 4.0 3.7  CL 99 97*  CO2 20* 24  BUN 16 19  CREATININE 1.24 1.29*  CALCIUM 8.5* 9.1  GLUCOSE 160* 149*    Imaging/Diagnostic Tests: None last 24 hours.   Ezequiel Essex, MD 04/03/2021, 7:47 AM PGY-2, [place] Intern pager: [phone], text pages welcome

## 2021-04-03 NOTE — Transfer of Care (Signed)
Immediate Anesthesia Transfer of Care Note  Patient: LONDELL MADRIL  Procedure(s) Performed: LEFT  HIP INTRAMEDULLARY (IM) NAIL INTERTROCHANTRIC (Left: Hip)  Patient Location: PACU  Anesthesia Type:General  Level of Consciousness: awake, alert  and oriented  Airway & Oxygen Therapy: Patient Spontanous Breathing  Post-op Assessment: Report given to RN and Post -op Vital signs reviewed and stable  Post vital signs: Reviewed and stable  Last Vitals:  Vitals Value Taken Time  BP 176/80 04/03/21 1117  Temp 36.6 C 04/03/21 1115  Pulse 80 04/03/21 1119  Resp 14 04/03/21 1119  SpO2 92 % 04/03/21 1119  Vitals shown include unvalidated device data.  Last Pain:  Vitals:   04/03/21 1115  TempSrc:   PainSc: 0-No pain         Complications: No notable events documented.

## 2021-04-03 NOTE — Evaluation (Signed)
Occupational Therapy Evaluation Patient Details Name: [name] MRN: [id number] DOB: Mar 27, 1946 Today's Date: 04/03/2021    History of Present Illness [name] is a 75 year old male who presented with left hip fracture s/p fall.  IM Nail for fixation done 04/03/2021, WBAT.  PMH includes extensive CAD s/p CABG x5, CKD stage III, diverticulitis, HTN, GERD, HLD, malnutrition, PAD, PVD, T2DM, diabetic neuropathy   Clinical Impression   Pt admitted for concerns/procedure listed above. PTA pt reported that he was independent with all ADL's and IADL's, including driving and yard work. At this time, pt is experiencing high levels of pain, due to being post op day 0. Requiring max verbal encouragement to participate. Pt requiring max-total A +2 for all mobility. Pt is expected to make increased progress as pain decreases. OT will follow acutely.    Follow Up Recommendations  Home health OT    Equipment Recommendations  3 in 1 bedside commode;Other (comment) (RW)    Recommendations for Other Services       Precautions / Restrictions Precautions Precautions: Fall Restrictions Weight Bearing Restrictions: No LLE Weight Bearing: Weight bearing as tolerated      Mobility Bed Mobility Overal bed mobility: Needs Assistance Bed Mobility: Supine to Sit;Sit to Supine     Supine to sit: Total assist;+2 for physical assistance Sit to supine: +2 for physical assistance;Mod assist   General bed mobility comments: Total assist to initiate and follow through with movement to get to EOB; Heavy antalgic lean initially EOB, with Max assist for sitting balance; RN joined [place] and gave IV pain meds once pt was sitting EOB; Better pain control and participation by the end of session, and needed mod assist to help LEs onto bed, and assist shoulders down    Transfers Overall transfer level: Needs assistance Equipment used: Rolling walker (2 wheeled) Transfers: Sit to/from Stand Sit to Stand: +2 physical  assistance;Max assist         General transfer comment: Cues for hand placement, and technqiue; Pt able to initiate with forward lean and rise onto RLE; unable to put weight on LLE in standing this session; cues to extend hips and uppoer trunk for fully upright standing    Balance Overall balance assessment: Needs assistance Sitting-balance support: Bilateral upper extremity supported;Feet supported Sitting balance-Leahy Scale: Zero (initially, progressed to almost Fair) Sitting balance - Comments: initially with extreme R antalgic lean sitting EOB; pt very close to EOB as well and required heavy R knee block to prevent scooting further out; Took time, multimodal cues, incremental repositioning of L foot on teh floor to get to a position of relative stability; Once there, pt able to use R foot and bil UEs for support to scoot hips back in the bed; After that, noted pt able to organize trunk over seat for scooting, and transfer work Postural control: Right lateral lean (antalgic) Standing balance support: Bilateral upper extremity supported Standing balance-Leahy Scale: Zero Standing balance comment: Stood with bilateral assist to RW, did not accepte weight onto L foot in standing; very slow rise, but demonstrated abiltiy to reach fully upright standing with suport and max encouragement                           ADL either performed or assessed with clinical judgement   ADL Overall ADL's : Needs assistance/impaired Eating/Feeding: Set up;Bed level   Grooming: Set up;Bed level   Upper Body Bathing: Minimal assistance;Bed level   Lower  Body Bathing: Total assistance;+2 for physical assistance;+2 for safety/equipment;Sitting/lateral leans;Sit to/from stand   Upper Body Dressing : Minimal assistance;Bed level   Lower Body Dressing: Total assistance;+2 for physical assistance;+2 for safety/equipment;Sitting/lateral leans;Sit to/from stand   Toilet Transfer: Total assistance;+2  for physical assistance;+2 for safety/equipment;Squat-pivot   Toileting- Clothing Manipulation and Hygiene: Total assistance;+2 for physical assistance;+2 for safety/equipment;Sitting/lateral lean;Sit to/from stand   Tub/ Shower Transfer: Total assistance;+2 for physical assistance;+2 for safety/equipment;Squat-pivot   Functional mobility during ADLs: Maximal assistance;Total assistance;+2 for physical assistance;+2 for safety/equipment;Rolling walker General ADL Comments: Pt very limited by pain this session, requiring max-total A +2 for all sitting,standing, and transfers. Pt unable to tolerate sitting to complete ADL's this session     Vision Baseline Vision/History: Wears glasses Wears Glasses: At all times Patient Visual Report: No change from baseline Vision Assessment?: No apparent visual deficits     Perception Perception Perception Tested?: No   Praxis Praxis Praxis tested?: Not tested    Pertinent Vitals/Pain Pain Assessment: Faces Faces Pain Scale: Hurts worst Pain Location: L hip Pain Descriptors / Indicators: Grimacing;Guarding (yelling) Pain Intervention(s): Repositioned;RN gave pain meds during session     Hand Dominance Right   Extremity/Trunk Assessment Upper Extremity Assessment Upper Extremity Assessment: Overall WFL for tasks assessed   Lower Extremity Assessment Lower Extremity Assessment: LLE deficits/detail LLE Deficits / Details: Extremely limited ROM and strength by pain postop; did not accept any weight onto LLE in standing LLE: Unable to fully assess due to pain LLE Sensation: history of peripheral neuropathy   Cervical / Trunk Assessment Cervical / Trunk Assessment: Normal   Communication Communication Communication: No difficulties   Cognition Arousal/Alertness: Awake/alert Behavior During Therapy: Restless Overall Cognitive Status: Impaired/Different from baseline Area of Impairment: Attention;Following commands                    Current Attention Level: Sustained (EXTREMELY internally distracted by pain and anticipation of pain)   Following Commands: Follows one step commands with increased time       General Comments: So anxious re: pain and anticipation of pain; Lots of encouragement to participate, including explaining considerations for safety with pain meds; Pt still resistant to moving, wanting to do it himself; Total assist to initiate movement   General Comments  Took extra time to explain to pt and wife that even as hard as it was to move today, we anticipate good progress, especially as we get his pain managed; discussed PT and OT goals, and how they relate to dc home    Exercises     Shoulder Instructions      Home Living Family/patient expects to be discharged to:: Private residence Living Arrangements: Spouse/significant other Available Help at Discharge: Family;Available 24 hours/day Type of Home: House Home Access: Stairs to enter [employer] of Steps: 5 (at back, 3 at front) Entrance Stairs-Rails: Right;Left Home Layout: One level     Bathroom Shower/Tub: Tub/shower unit;Walk-in shower   Bathroom Toilet: Standard     Home Equipment: Environmental consultant - 2 wheels;Shower seat;Cane - single point   Additional Comments: Need to verify that he has RW      Prior Functioning/Environment Level of Independence: Independent        Comments: REports occasional difficulty with balance and walkign due to neuropathy        OT Problem List: Decreased strength;Decreased activity tolerance;Impaired balance (sitting and/or standing);Decreased safety awareness;Decreased knowledge of use of DME or AE;Pain      OT Treatment/Interventions: Self-care/ADL  training;Therapeutic exercise;Energy conservation;DME and/or AE instruction;Therapeutic activities;Patient/family education;Balance training    OT Goals(Current goals can be found in the care plan section) Acute Rehab OT Goals Patient Stated  Goal: get pain under control OT Goal Formulation: With patient/family Time For Goal Achievement: 04/17/21 Potential to Achieve Goals: Good ADL Goals Pt Will Perform Lower Body Bathing: with min assist;sit to/from stand;sitting/lateral leans Pt Will Perform Lower Body Dressing: with min assist;sitting/lateral leans;sit to/from stand Pt Will Transfer to Toilet: with min assist;stand pivot transfer Pt Will Perform Toileting - Clothing Manipulation and hygiene: with min assist;sitting/lateral leans;sit to/from stand  OT Frequency: Min 2X/week   Barriers to D/C:            Co-evaluation PT/OT/SLP Co-Evaluation/Treatment: Yes Reason for Co-Treatment: For patient/therapist safety;To address functional/ADL transfers PT goals addressed during session: Mobility/safety with mobility OT goals addressed during session: ADL's and self-care;Strengthening/ROM      AM-PAC OT "6 Clicks" Daily Activity     Outcome Measure Help from another person eating meals?: A Little Help from another person taking care of personal grooming?: A Little Help from another person toileting, which includes using toliet, bedpan, or urinal?: Total Help from another person bathing (including washing, rinsing, drying)?: Total Help from another person to put on and taking off regular upper body clothing?: A Little Help from another person to put on and taking off regular lower body clothing?: Total 6 Click Score: 12   End of Session Equipment Utilized During Treatment: Gait belt;Rolling walker Nurse Communication: Mobility status;Patient requests pain meds  Activity Tolerance: Patient limited by pain Patient left: in bed;with call bell/phone within reach;with bed alarm set;with family/visitor present  OT Visit Diagnosis: Unsteadiness on feet (R26.81);Other abnormalities of gait and mobility (R26.89);Muscle weakness (generalized) (M62.81);Pain Pain - Right/Left: Left Pain - part of body: Hip                Time:  1446-1531 OT Time Calculation (min): 45 min Charges:  OT General Charges $OT Visit: 1 Visit OT Evaluation $OT Eval Moderate Complexity: 1 Mod OT Treatments $Self Care/Home Management : 8-22 mins  [place] Souter H., OTR/L Acute Rehabilitation  Lamont Tant Elane Yolanda Bonine 04/03/2021, 6:41 PM

## 2021-04-03 NOTE — Anesthesia Preprocedure Evaluation (Addendum)
Anesthesia Evaluation  Patient identified by MRN, date of birth, ID band Patient awake    Reviewed: Allergy & Precautions, NPO status , Patient's Chart, lab work & pertinent test results  Airway Mallampati: II  TM Distance: >3 FB Neck ROM: Full    Dental  (+) Poor Dentition, Edentulous Upper   Pulmonary neg pulmonary ROS, former smoker,    Pulmonary exam normal        Cardiovascular hypertension, Pt. on medications + CAD, + Cardiac Stents, + CABG (x5, 2017) and + Peripheral Vascular Disease (s/p fem-pop bypass)   Rhythm:Regular Rate:Normal  On plavix    Neuro/Psych negative neurological ROS  negative psych ROS   GI/Hepatic Neg liver ROS, GERD  Medicated,Diverticulitis s/p colectomy   Endo/Other  diabetes, Type 2, Insulin Dependent, Oral Hypoglycemic Agents  Renal/GU CRFRenal disease  negative genitourinary   Musculoskeletal Left hip fx s/p fall   Abdominal (+)  Abdomen: soft. Bowel sounds: normal.  Peds  Hematology negative hematology ROS (+)   Anesthesia Other Findings   Reproductive/Obstetrics                            Anesthesia Physical Anesthesia Plan  ASA: 3  Anesthesia Plan: General   Post-op Pain Management:    Induction: Intravenous  PONV Risk Score and Plan: 2 and Ondansetron, Dexamethasone, Treatment may vary due to age or medical condition and Midazolam  Airway Management Planned: Mask and Oral ETT  Additional Equipment: None  Intra-op Plan:   Post-operative Plan: Extubation in OR  Informed Consent: I have reviewed the patients History and Physical, chart, labs and discussed the procedure including the risks, benefits and alternatives for the proposed anesthesia with the patient or authorized representative who has indicated his/her understanding and acceptance.     Dental advisory given  Plan Discussed with: CRNA  Anesthesia Plan Comments: (Lab Results       Component                Value               Date                      WBC                      12.6 (H)            04/03/2021                HGB                      14.1                04/03/2021                HCT                      42.0                04/03/2021                MCV                      88.8                04/03/2021  PLT                      277                 04/03/2021           Lab Results      Component                Value               Date                      NA                       133 (L)             04/03/2021                K                        3.7                 04/03/2021                CO2                      24                  04/03/2021                GLUCOSE                  149 (H)             04/03/2021                BUN                      19                  04/03/2021                CREATININE               1.29 (H)            04/03/2021                CALCIUM                  9.1                 04/03/2021                GFRNONAA                 59 (L)              04/03/2021                GFRAA                    >60                 05/22/2019           Echocardiogram 03/27/2018: Study Conclusions  - Left ventricle: The cavity size was normal. There was mild focal  basal hypertrophy of the septum. Systolic function was normal.  The  estimated ejection fraction was in the range of 55% to 60%.  Wall motion was normal; there were no regional wall motion  abnormalities. Left ventricular diastolic function parameters  were normal.  - Right ventricle: The RV is not well visualized but appears  dilated with reduced RVF.  - Pulmonary arteries: Systolic pressure could not be accurately  estimated. )        Anesthesia Quick Evaluation

## 2021-04-03 NOTE — Plan of Care (Signed)
  Problem: Education: Goal: Knowledge of General Education information will improve Description: Including pain rating scale, medication(s)/side effects and non-pharmacologic comfort measures Outcome: Progressing   Problem: Pain Managment: Goal: General experience of comfort will improve Outcome: Progressing   

## 2021-04-03 NOTE — Progress Notes (Signed)
Patient ID: [name], male   DOB: 03/10/1946, 75 y.o.   MRN: [id number] The patient is awake and alert this morning.  His vital signs are normal.  He understands fully that our recommendation is to proceed to surgery today for surgical stabilization of his left hip intertrochanteric fracture.  I described what the surgery involves in detail.  I talked about the risks and benefits of surgery as well.  We did delay surgery given the fact the patient is on Eliquis and wanted this to dilute out of his system to some degree.  The goal is being able to get his hip stabilized so we can begin early mobility and weightbearing as soon as this afternoon or tomorrow.  All questions and concerns were answered and addressed.

## 2021-04-03 NOTE — Evaluation (Signed)
Physical Therapy Evaluation Patient Details Name: [name] MRN: [id number] DOB: [birth year] Today's Date: 04/03/2021   History of Present Illness  [name] is a 75 year old male who presented with left hip fracture s/p fall.  IM Nail for fixation done 04/03/2021, WBAT.  PMH includes extensive CAD s/p CABG x5, CKD stage III, diverticulitis, HTN, GERD, HLD, malnutrition, PAD, PVD, T2DM, diabetic neuropathy  Clinical Impression   Patient is s/p above surgery resulting in functional limitations due to the deficits listed below (see PT Problem List). Comes from home where pt lives with wife in single level home with 3-5 steps to enter; independent with amb, but does report some difficulty with walking due to neuropathy;  Presents to PT with pain limiting activity tolerance, limiting weight bearing; Currently needed +2 Total assist with initiating getting up to EOB (very anxious re: pain and anticipation of pain), and max assist to stand to RW; Even with this high level of assist, I anticipate good progress with mobility, especially as his pain is managed, and I anticipate dc home with assist; Patient will benefit from skilled PT to increase their independence and safety with mobility to allow discharge to the venue listed below.       Follow Up Recommendations Home health PT;Supervision/Assistance - 24 hour (Will monitor for progress)    Equipment Recommendations  Rolling walker with 5" wheels;3in1 (PT)    Recommendations for Other Services       Precautions / Restrictions Precautions Precautions: Fall Restrictions Weight Bearing Restrictions: No LLE Weight Bearing: Weight bearing as tolerated      Mobility  Bed Mobility Overal bed mobility: Needs Assistance Bed Mobility: Supine to Sit;Sit to Supine     Supine to sit: Total assist;+2 for physical assistance Sit to supine: +2 for physical assistance;Mod assist   General bed mobility comments: Total assist to initiate and follow  through with movement to get to EOB; Heavy antalgic lean initially EOB, with Max assist for sitting balance; RN joined [place] and gave IV pain meds once pt was sitting EOB; Better pain control and participation by the end of session, and needed mod assist to help LEs onto bed, and assist shoulders down    Transfers Overall transfer level: Needs assistance Equipment used: Rolling walker (2 wheeled) Transfers: Sit to/from Stand Sit to Stand: +2 physical assistance;Max assist         General transfer comment: Cues for hand placement, and technqiue; Pt able to initiate with forward lean and rise onto RLE; unable to put weight on LLE in standing this session; cues to extend hips and uppoer trunk for fully upright standing  Ambulation/Gait                Stairs            Wheelchair Mobility    Modified Rankin (Stroke Patients Only)       Balance Overall balance assessment: Needs assistance Sitting-balance support: Bilateral upper extremity supported;Feet supported Sitting balance-Leahy Scale: Zero (initially, progressed to almost Fair) Sitting balance - Comments: initially with extreme R antalgic lean sitting EOB; pt very close to EOB as well and required heavy R knee block to prevent scooting further out; Took time, multimodal cues, incremental repositioning of L foot on teh floor to get to a position of relative stability; Once there, pt able to use R foot and bil UEs for support to scoot hips back in the bed; After that, noted pt able to organize trunk over seat for scooting,  and transfer work Postural control: Right lateral lean (antalgic) Standing balance support: Bilateral upper extremity supported Standing balance-Leahy Scale: Zero Standing balance comment: Stood with bilateral assist to RW, did not accepte weight onto L foot in standing; very slow rise, but demonstrated abiltiy to reach fully upright standing with suport and max encouragement                              Pertinent Vitals/Pain Pain Assessment: Faces Faces Pain Scale: Hurts worst Pain Location: L hip Pain Descriptors / Indicators: Grimacing;Guarding (yelling) Pain Intervention(s): Repositioned;RN gave pain meds during session    Home Living Family/patient expects to be discharged to:: Private residence Living Arrangements: Spouse/significant other Available Help at Discharge: Family;Available 24 hours/day Type of Home: House Home Access: Stairs to enter Entrance Stairs-Rails: Psychiatric nurse of Steps: 5 (at back, 3 at front) Home Layout: One level Home Equipment: Walker - 2 wheels;Shower seat;Cane - single point Additional Comments: Need to verify that he has RW    Prior Function Level of Independence: Independent         Comments: REports occasional difficulty with balance and walkign due to neuropathy     Hand Dominance        Extremity/Trunk Assessment   Upper Extremity Assessment Upper Extremity Assessment: Defer to OT evaluation    Lower Extremity Assessment Lower Extremity Assessment: LLE deficits/detail LLE Deficits / Details: Extremely limited ROM and strength by pain postop; did not accept any weight onto LLE in standing LLE: Unable to fully assess due to pain LLE Sensation: history of peripheral neuropathy       Communication   Communication: No difficulties  Cognition Arousal/Alertness: Awake/alert Behavior During Therapy: Restless Overall Cognitive Status: Impaired/Different from baseline Area of Impairment: Attention;Following commands                   Current Attention Level: Sustained (EXTREMELY internally distracted by pain and anticipation of pain)   Following Commands: Follows one step commands with increased time       General Comments: So anxious re: pain and anticipation of pain; Lots of encouragement to participate, including explaining considerations for safety with pain meds; Pt still resistant  to moving, wanting to do it himself; Total assist to initiate movement      General Comments General comments (skin integrity, edema, etc.): Took extra time to explain to pt and wife that even as hard as it was to move today, we anticipate good progress, especially as we get his pain managed; discussed PT and OT goals, and how they relate to dc home    Exercises     Assessment/Plan    PT Assessment Patient needs continued PT services  PT Problem List Decreased strength;Decreased range of motion;Decreased activity tolerance;Decreased balance;Decreased mobility;Decreased coordination;Decreased knowledge of use of DME;Decreased safety awareness;Decreased knowledge of precautions;Cardiopulmonary status limiting activity;Impaired sensation;Pain       PT Treatment Interventions DME instruction;Gait training;Stair training;Functional mobility training;Therapeutic activities;Therapeutic exercise;Balance training;Cognitive remediation;Patient/family education;Wheelchair mobility training    PT Goals (Current goals can be found in the Care Plan section)  Acute Rehab PT Goals Patient Stated Goal: get pain under control PT Goal Formulation: With patient/family Time For Goal Achievement: 04/17/21 Potential to Achieve Goals: Good    Frequency Min 6X/week   Barriers to discharge        Co-evaluation PT/OT/SLP Co-Evaluation/Treatment: Yes Reason for Co-Treatment: For patient/therapist safety;To address functional/ADL transfers PT goals addressed during  session: Mobility/safety with mobility         AM-PAC PT "6 Clicks" Mobility  Outcome Measure Help needed turning from your back to your side while in a flat bed without using bedrails?: Total Help needed moving from lying on your back to sitting on the side of a flat bed without using bedrails?: Total Help needed moving to and from a bed to a chair (including a wheelchair)?: A Lot Help needed standing up from a chair using your arms (e.g.,  wheelchair or bedside chair)?: A Lot Help needed to walk in hospital room?: Total Help needed climbing 3-5 steps with a railing? : Total 6 Click Score: 8    End of Session Equipment Utilized During Treatment: Gait belt Activity Tolerance: Patient limited by pain Patient left: in bed;with call bell/phone within reach;with bed alarm set Nurse Communication: Mobility status PT Visit Diagnosis: Unsteadiness on feet (R26.81);Other abnormalities of gait and mobility (R26.89);Pain Pain - Right/Left: Left Pain - part of body: Hip    Time: 1446-1531 PT Time Calculation (min) (ACUTE ONLY): 45 min   Charges:   PT Evaluation $PT Eval Moderate Complexity: 1 Mod PT Treatments $Therapeutic Activity: 8-22 mins        Roney Marion, PT  Acute Rehabilitation Services Pager [phone] Office [phone]   Colletta [place] 04/03/2021, 6:12 PM

## 2021-04-04 ENCOUNTER — Encounter (HOSPITAL_COMMUNITY): Payer: Self-pay | Admitting: Orthopaedic Surgery

## 2021-04-04 DIAGNOSIS — I739 Peripheral vascular disease, unspecified: Secondary | ICD-10-CM | POA: Diagnosis not present

## 2021-04-04 DIAGNOSIS — S72002A Fracture of unspecified part of neck of left femur, initial encounter for closed fracture: Secondary | ICD-10-CM | POA: Diagnosis not present

## 2021-04-04 DIAGNOSIS — I25118 Atherosclerotic heart disease of native coronary artery with other forms of angina pectoris: Secondary | ICD-10-CM

## 2021-04-04 DIAGNOSIS — E1159 Type 2 diabetes mellitus with other circulatory complications: Secondary | ICD-10-CM | POA: Diagnosis not present

## 2021-04-04 DIAGNOSIS — S72145A Nondisplaced intertrochanteric fracture of left femur, initial encounter for closed fracture: Secondary | ICD-10-CM | POA: Diagnosis not present

## 2021-04-04 DIAGNOSIS — W19XXXA Unspecified fall, initial encounter: Secondary | ICD-10-CM | POA: Diagnosis not present

## 2021-04-04 DIAGNOSIS — I152 Hypertension secondary to endocrine disorders: Secondary | ICD-10-CM

## 2021-04-04 LAB — BASIC METABOLIC PANEL
Anion gap: 15 (ref 5–15)
BUN: 27 mg/dL — ABNORMAL HIGH (ref 8–23)
CO2: 22 mmol/L (ref 22–32)
Calcium: 8.5 mg/dL — ABNORMAL LOW (ref 8.9–10.3)
Chloride: 96 mmol/L — ABNORMAL LOW (ref 98–111)
Creatinine, Ser: 1.39 mg/dL — ABNORMAL HIGH (ref 0.61–1.24)
GFR, Estimated: 54 mL/min — ABNORMAL LOW (ref >60)
Glucose, Bld: 155 mg/dL — ABNORMAL HIGH (ref 70–99)
Potassium: 4.1 mmol/L (ref 3.5–5.1)
Sodium: 133 mmol/L — ABNORMAL LOW (ref 135–145)

## 2021-04-04 LAB — GLUCOSE, CAPILLARY
Glucose-Capillary: 126 mg/dL — ABNORMAL HIGH (ref 70–99)
Glucose-Capillary: 238 mg/dL — ABNORMAL HIGH (ref 70–99)
Glucose-Capillary: 160 mg/dL — ABNORMAL HIGH (ref 70–99)
Glucose-Capillary: 136 mg/dL — ABNORMAL HIGH (ref 70–99)

## 2021-04-04 LAB — CBC
HCT: 34.9 % — ABNORMAL LOW (ref 39.0–52.0)
Hemoglobin: 11.9 g/dL — ABNORMAL LOW (ref 13.0–17.0)
MCH: 29.8 pg (ref 26.0–34.0)
MCHC: 34.1 g/dL (ref 30.0–36.0)
MCV: 87.3 fL (ref 80.0–100.0)
Platelets: 285 10*3/uL (ref 150–400)
RBC: 4 MIL/uL — ABNORMAL LOW (ref 4.22–5.81)
RDW: 14.9 % (ref 11.5–15.5)
WBC: 13.6 10*3/uL — ABNORMAL HIGH (ref 4.0–10.5)
nRBC: 0 % (ref 0.0–0.2)

## 2021-04-04 MED ORDER — EMPAGLIFLOZIN 10 MG PO TABS
10.0000 mg | ORAL_TABLET | Freq: Every day | ORAL | Status: DC
  Administered 2021-04-04 – 2021-04-06 (×3): 10 mg via ORAL
  Filled 2021-04-04 (×3): qty 1

## 2021-04-04 MED ORDER — METFORMIN HCL 500 MG PO TABS
1000.0000 mg | ORAL_TABLET | Freq: Two times a day (BID) | ORAL | Status: DC
  Administered 2021-04-04 – 2021-04-06 (×4): 1000 mg via ORAL
  Filled 2021-04-04 (×5): qty 2

## 2021-04-04 NOTE — Plan of Care (Signed)

## 2021-04-04 NOTE — TOC CAGE-AID Note (Signed)
Transition of Care [facility]) - CAGE-AID Screening   Patient Details  Name: [name] MRN: [id number] Date of Birth: [birth year]  [facility] CM/SW Contact:    Gaetano Hawthorne Tarpley-Carter, [place] Phone Number: 04/04/2021, 2:58 PM   Clinical Narrative: Pt participated in [place].  Pt stated he does not use substance or ETOH.  Pt was not offered resources, due to no usage of substance or ETOH.     [name], MSW, LCSW-A Pronouns:  She/Her/Hers [facility] HealthTransitions of Care Clinical Social Worker Direct Number:  [phone] [name]   CAGE-AID Screening:    Have You Ever Felt You Ought to Cut Down on Your Drinking or Drug Use?: No Have People Annoyed You By [employer] Your Drinking Or Drug Use?: No Have You Felt Bad Or Guilty About Your Drinking Or Drug Use?: No Have You Ever Had a Drink or Used Drugs First Thing In The Morning to Steady Your Nerves or to Get Rid of a Hangover?: No CAGE-AID Score: 0  Substance Abuse Education Offered: No

## 2021-04-04 NOTE — Progress Notes (Signed)
Physical Therapy Treatment Patient Details Name: [name] MRN: [id number] DOB: 15-May-1946 Today's Date: 04/04/2021    History of Present Illness [name] is a 75 year old male who presented with left hip fracture s/p fall.  IM Nail for fixation done 04/03/2021, WBAT.  PMH includes extensive CAD s/p CABG x5, CKD stage III, diverticulitis, HTN, GERD, HLD, malnutrition, PAD, PVD, T2DM, diabetic neuropathy    PT Comments    Pt supine in bed.  He is moving a lot better than he initially did.  Pt continues to benefit from HHPT at d/c.  Will focus on advancing gt next session.     Follow Up Recommendations  Home health PT;Supervision/Assistance - 24 hour     Equipment Recommendations  Rolling walker with 5" wheels;3in1 (PT)    Recommendations for Other Services       Precautions / Restrictions Precautions Precautions: Fall Restrictions Weight Bearing Restrictions: Yes LLE Weight Bearing: Weight bearing as tolerated    Mobility  Bed Mobility Overal bed mobility: Needs Assistance Bed Mobility: Supine to Sit     Supine to sit: Min assist;+2 for safety/equipment     General bed mobility comments: Pt required min assistance for trunk elevation and to scoot hips to edge of bed.  Increased time and effort needed to complete mobility.  Heavy use of bed rails and used gt belt to assist LLE OOB.    Transfers Overall transfer level: Needs assistance Equipment used: Rolling walker (2 wheeled) Transfers: Sit to/from Stand Sit to Stand: Min assist;From elevated surface         General transfer comment: Cues for hand placement to and from seated surface.  Pt again required increased time but decreased assistance to complete sit to stand.  Ambulation/Gait Ambulation/Gait assistance: Min assist Gait Distance (Feet): 6 Feet Assistive device: Rolling walker (2 wheeled) Gait Pattern/deviations: Step-to pattern;Trunk flexed;Antalgic     General Gait Details: Cues for sequencing, for  turns and for backing.   Stairs             Wheelchair Mobility    Modified Rankin (Stroke Patients Only)       Balance Overall balance assessment: Needs assistance   Sitting balance-Leahy Scale: Good       Standing balance-Leahy Scale: Poor Standing balance comment: BUE support                            Cognition Arousal/Alertness: Awake/alert Behavior During Therapy: WFL for tasks assessed/performed Overall Cognitive Status: Within Functional Limits for tasks assessed                                 General Comments: No issues this session.      Exercises General Exercises - Lower Extremity Ankle Circles/Pumps: AROM;Both;10 reps;Supine Quad Sets: AROM;Left;10 reps;Supine Heel Slides: AAROM;Left;10 reps;Supine Hip ABduction/ADduction: AAROM;Left;10 reps;Supine    General Comments        Pertinent Vitals/Pain Pain Assessment: 0-10 Pain Score: 6  Pain Location: L hip Pain Descriptors / Indicators: Grimacing;Guarding Pain Intervention(s): Monitored during session;Repositioned    Home Living                      Prior Function            PT Goals (current goals can now be found in the care plan section) Acute Rehab PT Goals Patient Stated Goal: go  home Potential to Achieve Goals: Good Progress towards PT goals: Progressing toward goals    Frequency    Min 6X/week      PT Plan Current plan remains appropriate    Co-evaluation              AM-PAC PT "6 Clicks" Mobility   Outcome Measure  Help needed turning from your back to your side while in a flat bed without using bedrails?: A Little Help needed moving from lying on your back to sitting on the side of a flat bed without using bedrails?: A Little Help needed moving to and from a bed to a chair (including a wheelchair)?: A Little Help needed standing up from a chair using your arms (e.g., wheelchair or bedside chair)?: A Little Help needed to  walk in hospital room?: A Little Help needed climbing 3-5 steps with a railing? : A Lot 6 Click Score: 17    End of Session Equipment Utilized During Treatment: Gait belt Activity Tolerance: Patient limited by pain Patient left: with call bell/phone within reach;in chair;with chair alarm set Nurse Communication: Mobility status PT Visit Diagnosis: Unsteadiness on feet (R26.81);Other abnormalities of gait and mobility (R26.89);Pain Pain - Right/Left: Left Pain - part of body: Hip     Time: [id number] PT Time Calculation (min) (ACUTE ONLY): 19 min  Charges:  $Gait Training: 8-22 mins                     [name] , PTA Acute Rehabilitation Services Pager [phone] Office [phone]    [name] [name] 04/04/2021, 5:32 PM

## 2021-04-04 NOTE — Progress Notes (Signed)
FPTS Brief Progress Note  S: Patient is doing well with pain well controlled.  No complaints at this time   O: BP (!) 109/48 (BP Location: Left Arm)   Pulse 63   Temp 97.8 F (36.6 C) (Oral)   Resp 18   Ht '5\' 7"'$  (1.702 m)   Wt 64.4 kg   SpO2 99%   BMI 22.24 kg/m   General: Patient resting comfortably in bed  A/P: -Ortho to see patient in the morning, will check dressing, to have PT see patient before discharge - Orders reviewed. Labs for AM not ordered, which was adjusted as needed.  -Plan per day team  Erskine Emery, MD 04/04/2021, 9:38 PM PGY-1, [facility] Night Resident  Please page [phone] with questions.

## 2021-04-04 NOTE — Progress Notes (Addendum)
Progress Note  Patient Name: [name] Date of Encounter: 04/04/2021  [facility] Cardiologist: Quay Burow, MD   Subjective   Feels well after surgery. No CP, SOB, palpitations  Inpatient Medications    Scheduled Meds:  acetaminophen  650 mg Oral Q6H   amLODipine  10 mg Oral Daily   aspirin EC  81 mg Oral QHS   aspirin EC  81 mg Oral Daily   atorvastatin  40 mg Oral q1800   cilostazol  100 mg Oral BID   clopidogrel  75 mg Oral Daily   docusate sodium  100 mg Oral BID   ferrous sulfate  325 mg Oral TID   gabapentin  600 mg Oral TID   hydrochlorothiazide  12.5 mg Oral Daily   insulin aspart  0-15 Units Subcutaneous TID WC   insulin glargine-yfgn  10 Units Subcutaneous QHS   losartan  50 mg Oral Daily   metoprolol tartrate  25 mg Oral BID   mupirocin ointment  1 application Nasal BID   pantoprazole  40 mg Oral Daily   Continuous Infusions:  methocarbamol (ROBAXIN) IV     PRN Meds: albuterol, alum & mag hydroxide-simeth, menthol-cetylpyridinium **OR** phenol, methocarbamol **OR** methocarbamol (ROBAXIN) IV, metoCLOPramide **OR** metoCLOPramide (REGLAN) injection, ondansetron **OR** ondansetron (ZOFRAN) IV, oxyCODONE   Vital Signs    Vitals:   04/03/21 1433 04/03/21 2010 04/04/21 0004 04/04/21 0755  BP: 129/65 121/64 117/66 (!) 141/67  Pulse: 81 87 74 74  Resp: '20 18 17   '$ Temp: 97.7 F (36.5 C) 98.6 F (37 C) 98.1 F (36.7 C) 98.2 F (36.8 C)  TempSrc:  Oral Oral Oral  SpO2: 92% 94% 93% 97%  Weight:      Height:        Intake/Output Summary (Last 24 hours) at 04/04/2021 1017 Last data filed at 04/04/2021 0948 Gross per 24 hour  Intake 1470 ml  Output 950 ml  Net 520 ml   Last 3 Weights 04/01/2021 02/08/2021 12/29/2020  Weight (lbs) 142 lb 140 lb 140 lb 9.6 oz  Weight (kg) 64.411 kg 63.504 kg 63.776 kg      Telemetry    Sinus rhythm HR 70-80s - Personally Reviewed  ECG    No new tracings - Personally Reviewed  Physical Exam   GEN: No  acute distress.   Neck: No JVD Cardiac: RRR, no murmurs, rubs, or gallops.  Respiratory: Clear to auscultation bilaterally. GI: Soft, nontender, non-distended  MS: No edema; No deformity. Neuro:  Nonfocal  Psych: Normal affect   Labs    High Sensitivity Troponin:  No results for input(s): TROPONINIHS in the last 720 hours.    Chemistry Recent Labs  Lab 04/01/21 1507 04/03/21 0245 04/04/21 0136  NA 136 133* 133*  K 4.0 3.7 4.1  CL 99 97* 96*  CO2 20* 24 22  GLUCOSE 160* 149* 155*  BUN 16 19 27*  CREATININE 1.24 1.29* 1.39*  CALCIUM 8.5* 9.1 8.5*  GFRNONAA >60 59* 54*  ANIONGAP 17* 12 15     Hematology Recent Labs  Lab 04/02/21 0024 04/03/21 0245 04/04/21 0136  WBC 10.9* 12.6* 13.6*  RBC 4.43 4.73 4.00*  HGB 13.1 14.1 11.9*  HCT 39.1 42.0 34.9*  MCV 88.3 88.8 87.3  MCH 29.6 29.8 29.8  MCHC 33.5 33.6 34.1  RDW 14.9 15.1 14.9  PLT 289 277 285    BNPNo results for input(s): BNP, PROBNP in the last 168 hours.   DDimer No results for input(s):  DDIMER in the last 168 hours.   Radiology    DG C-Arm 1-60 Min  Result Date: 04/03/2021 CLINICAL DATA:  ORIF of a left intertrochanteric proximal femur fracture. EXAM: LEFT FEMUR 2 VIEWS; DG C-ARM 1-60 MIN COMPARISON:  04/01/2021 FINDINGS: Five portable images from the ORIF of the proximal left femur fracture demonstrate placement of 2 screws across an intramedullary rod. The intertrochanteric fracture is well aligned and the orthopedic hardware well-seated. No acute fracture or evidence of an operative complication. IMPRESSION: Well-positioned left proximal femur fracture following ORIF. Electronically Signed   By: Lajean Manes M.D.   On: 04/03/2021 13:04   DG FEMUR MIN 2 VIEWS LEFT  Result Date: 04/03/2021 CLINICAL DATA:  ORIF of a left intertrochanteric proximal femur fracture. EXAM: LEFT FEMUR 2 VIEWS; DG C-ARM 1-60 MIN COMPARISON:  04/01/2021 FINDINGS: Five portable images from the ORIF of the proximal left femur  fracture demonstrate placement of 2 screws across an intramedullary rod. The intertrochanteric fracture is well aligned and the orthopedic hardware well-seated. No acute fracture or evidence of an operative complication. IMPRESSION: Well-positioned left proximal femur fracture following ORIF. Electronically Signed   By: Lajean Manes M.D.   On: 04/03/2021 13:04    Cardiac Studies   Echo 2019 Study Conclusions   - Left ventricle: The cavity size was normal. There was mild focal    basal hypertrophy of the septum. Systolic function was normal.    The estimated ejection fraction was in the range of 55% to 60%.    Wall motion was normal; there were no regional wall motion    abnormalities. Left ventricular diastolic function parameters    were normal.  - Right ventricle: The RV is not well visualized but appears    dilated with reduced RVF.  - Pulmonary arteries: Systolic pressure could not be accurately    estimated.   Patient Profile     75 y.o. male with a hx of CAD s/p LCx stent 2003 and subsequent CABG 09/2015 with LIMA to his LAD, SVG to diagonal branch, obtuse marginal branches 1 and 2 and the distal RCA, PVD s/p multiple interventions of the lower extremities including endarterectomy and patch angioplasty of his calcified right common femoral artery as well as stenting of his iliac artery 2017 with recent doppler studies showing a patent right to left femorofemoral crossover graft and patent right iliac stent. Also with a hx of remote tobacco use with known lung nodules, HTN, HLD, DM2, and CKD stage II who was seen for preoperative clearance.   Assessment & Plan    Mechanical fall Left hip intertrochanteric fracture - s/p surgical repair yesterday - mechanical fall due to neuropathy - per patient - no syncope or cardiac symptoms prior to fall   CAD s/p CABG x 5 2017 - no chest pain - ASA and plavix have been restarted   PAD - hx of PAD s/p fem-fem bypass, endarterectomy and  patch angioplasty of right common femoral artery, stenting of iliac - follows with [facility] and Dr. Gwenlyn Found - continue pletal   Hyperlipidemia with LDL  goal < 70 12/29/2020: Cholesterol, Total 141; HDL 32; LDL Chol Calc (NIH) 84; Triglycerides 141 - on 40 mg lipitor - consider increasing to 80 mg at next visit based on last LDL, CAD and PAD   Hypertension - continue  home regimen - amlodipine, HCTZ, losartan, BB - elevated BP this morning prior to medications   Dispo - per PT and surgeon    [facility]  will sign off.   Medication Recommendations:  as in [facility] Other recommendations (labs, testing, etc):  If not mylagias, at follow up would consider lipitor 80 mg PO daily Follow up as an outpatient:  cardiology follow up has been arranged  For questions or updates, please contact [place] Please consult [email or web address] for contact info under        Signed, Ledora Bottcher, PA  04/04/2021, 10:17 AM    Personally seen and examined. Agree with APP above with the following comments and with changes made to the above: Briefly 75 yo M with CAD and PAD, HTN with DM who presents for time sensitive L hip surgery Patient notes no CP, SOB, leg claudication. Feels well and that the surgeons did a good job. Exam notable for regular rate and rhythm, CTAB, soft abdomen, distal pulses bilaterally. Labs notable for Hgb 14.1-> 11.9 Would recommend  - continue ASA, plavix at this time (prior meds- without recent revascularization); transition to single anti-platelet medication can be re-evaluated my patient's primary cardiologist - oupatient atorvastatin may be increased to 80 mg PO Daily if no other concerns - continue cilastazol - no change to BP regiment at this time. - will sign off as above.  Rudean Haskell, MD [address], [address] [place], [place] [address] [phone]  12:09 PM

## 2021-04-04 NOTE — Progress Notes (Signed)
Subjective: 1 Day Post-Op Procedure(s) (LRB): LEFT  HIP INTRAMEDULLARY (IM) NAIL INTERTROCHANTRIC (Left) Patient reports pain as moderate.  He tolerated the surgery well yesterday on his left hip.  I described what the surgery involves.  He is already sitting in the chair and appears comfortable.  Objective: Vital signs in last 24 hours: Temp:  [97.7 F (36.5 C)-98.6 F (37 C)] 98.2 F (36.8 C) (08/22 0755) Pulse Rate:  [74-87] 74 (08/22 0755) Resp:  [17-20] 17 (08/22 0004) BP: (117-141)/(61-67) 141/67 (08/22 0755) SpO2:  [92 %-97 %] 97 % (08/22 0755)  Intake/Output from previous day: 08/21 0701 - 08/22 0700 In: 1070 [I.V.:700; IV Piggyback:370] Out: 950 [Urine:875; Blood:75] Intake/Output this shift: Total I/O In: 400 [P.O.:400] Out: -   Recent Labs    04/01/21 1507 04/02/21 0024 04/03/21 0245 04/04/21 0136  HGB 13.1 13.1 14.1 11.9*   Recent Labs    04/03/21 0245 04/04/21 0136  WBC 12.6* 13.6*  RBC 4.73 4.00*  HCT 42.0 34.9*  PLT 277 285   Recent Labs    04/03/21 0245 04/04/21 0136  NA 133* 133*  K 3.7 4.1  CL 97* 96*  CO2 24 22  BUN 19 27*  CREATININE 1.29* 1.39*  GLUCOSE 149* 155*  CALCIUM 9.1 8.5*   Recent Labs    04/01/21 1507  INR 1.0    Sensation intact distally Intact pulses distally Dorsiflexion/Plantar flexion intact Incision: scant drainage   Assessment/Plan: 1 Day Post-Op Procedure(s) (LRB): LEFT  HIP INTRAMEDULLARY (IM) NAIL INTERTROCHANTRIC (Left) Up with therapy No hip precautions with weightbearing as tolerated on the left operative hip.     [name] 04/04/2021, 1:00 PM

## 2021-04-04 NOTE — Progress Notes (Addendum)
Family Medicine Teaching Service Daily Progress Note Intern Pager: [phone]  Patient name: [name] Medical record number: [id number] Date of birth: 07/29/1946 Age: 75 y.o. Gender: male  Primary Care Provider: [name], MD Consultants: Ortho Code Status: Full  Pt Overview and Major Events to Date:  8/19: Admitted, tripe APT held 8/21: OR with ortho for left hip intramedullary nail placement  Assessment and Plan: [name] is a 75 year old male who presented with left hip fracture s/p fall.  Awaiting OR today at 930.  PMH includes extensive CAD s/p CABG x5, CKD stage III, diverticulitis, HTN, GERD, HLD, malnutrition, PAD, PVD, T2DM, diabetic neuropathy  Left hip fracture s/p left hip intramedullary nail placement POD #1, no complications.  Pain well controlled this morning.  He worked with PT yesterday and stand.  PT recommends home with 24-hour assistance for transition out of hospital, and home health PT.  Was able to patient not passing flatus or bowel movement as of yet.  Only required 1 dose of oxycodone last night at 1721.  He has no complaints.  Dr. Ninfa Linden is okay with Plavix patient is already taking from home for post-op prophylaxis His wife will be here soon for him to eat breakfast. -Regular diet -Continue scheduled Tylenol every 6 hours -Continue oxycodone 5 mg as needed for severe pain - Dr. Ninfa Linden to see pt tomorrow for dressing change -PT/OT  CAD s/p CABG x4, PAD, PVD Cardiology cleared to restart home aspirin, Plavix, Pletal postop. -Restart triple antiplatelet therapy today  Hypertension BP 141/67 -Continue losartan, hydrochlorothiazide, amlodipine, metoprolol  T2DM Glucose -Continue SSI moderate -Continue insulin glargine 10 units daily at bedtime  HLD Chronic, stable -Continue home atorvastatin 40 mg  FEN/GI: Carb modified heart healthy diet PPx: SCDs Dispo:Home with home health  pending clinical improvement . Barriers include diet  toleration, bowel movement, clinical improvement.   Subjective:  [name] feels well this morning has no complaints.  His pain is well controlled with Tylenol.  He is requesting coffee and feels like he is ready to eat breakfast.  He did not have anything for dinner last night.  No flatus or bowel movement yet.  He is urinating normally.  Objective: Temp:  [97.7 F (36.5 C)-98.6 F (37 C)] 98.2 F (36.8 C) (08/22 0755) Pulse Rate:  [72-87] 74 (08/22 0755) Resp:  [12-20] 17 (08/22 0004) BP: (117-176)/(61-80) 141/67 (08/22 0755) SpO2:  [92 %-97 %] 97 % (08/22 0755) Physical Exam: General: Well-appearing, no acute distress, resting comfortably in bed Cardiovascular: RRR Respiratory: CTAB Abdomen: Soft, non-tender, non-distended MSK: Dressing in place over left hip incision, dry, intact with scant dried blood. No overlying erythema. Extremities: Warm, dry, well-perfused.  Laboratory: Recent Labs  Lab 04/02/21 0024 04/03/21 0245 04/04/21 0136  WBC 10.9* 12.6* 13.6*  HGB 13.1 14.1 11.9*  HCT 39.1 42.0 34.9*  PLT 289 277 285   Recent Labs  Lab 04/01/21 1507 04/03/21 0245 04/04/21 0136  NA 136 133* 133*  K 4.0 3.7 4.1  CL 99 97* 96*  CO2 20* 24 22  BUN 16 19 27*  CREATININE 1.24 1.29* 1.39*  CALCIUM 8.5* 9.1 8.5*  GLUCOSE 160* 149* 155*    Imaging/Diagnostic Tests: DG C-Arm 1-60 Min  Result Date: 04/03/2021 CLINICAL DATA:  ORIF of a left intertrochanteric proximal femur fracture. EXAM: LEFT FEMUR 2 VIEWS; DG C-ARM 1-60 MIN COMPARISON:  04/01/2021 FINDINGS: Five portable images from the ORIF of the proximal left femur fracture demonstrate placement of 2 screws across an intramedullary  rod. The intertrochanteric fracture is well aligned and the orthopedic hardware well-seated. No acute fracture or evidence of an operative complication. IMPRESSION: Well-positioned left proximal femur fracture following ORIF. Electronically Signed   By: Lajean Manes M.D.   On: 04/03/2021 13:04    DG FEMUR MIN 2 VIEWS LEFT  Result Date: 04/03/2021 CLINICAL DATA:  ORIF of a left intertrochanteric proximal femur fracture. EXAM: LEFT FEMUR 2 VIEWS; DG C-ARM 1-60 MIN COMPARISON:  04/01/2021 FINDINGS: Five portable images from the ORIF of the proximal left femur fracture demonstrate placement of 2 screws across an intramedullary rod. The intertrochanteric fracture is well aligned and the orthopedic hardware well-seated. No acute fracture or evidence of an operative complication. IMPRESSION: Well-positioned left proximal femur fracture following ORIF. Electronically Signed   By: Lajean Manes M.D.   On: 04/03/2021 13:04     Orvis Brill, DO 04/04/2021, 9:48 AM PGY-1, [place] Intern pager: [phone], text pages welcome

## 2021-04-04 NOTE — Discharge Instructions (Addendum)
Dear [name],   Thank you so much for allowing [place] to be part of your care!  You were admitted to [facility] for hip fracture and underwent surgery to help with this.   We recommend that you schedule an appointment with primary doctor within the next 7 days.  You will need to have repeat blood work to check your kidney function.    Hip Fracture You may put all of your weight on your left hip as comfort allows. Only be up with a walker when you do ambulate. You can get your current dressing wet in the shower. That dressing can stay on until your follow-up appointment.  POST-HOSPITAL & CARE INSTRUCTIONS STOP taking HCTZ and Amlodipine because of low blood pressure until you follow up with your PCP. Please follow-up at your appointments seen below. Please let PCP/Specialists know of any changes that were made.  Please see medications section of this packet for any medication changes.   DOCTOR'S APPOINTMENT & FOLLOW UP CARE INSTRUCTIONS  Future Appointments  Date Time Provider [place]  04/22/2021  3:15 PM Darreld Mclean, PA-C [facility]    RETURN PRECAUTIONS: Please go to the ED for dizziness, weakness, severe pain with fever >100.4, signs of infection including redness, warmth, tenderness at incision site.  Take care and be well!  [facility]  [place], [place] [address] [phone]

## 2021-04-04 NOTE — Discharge Summary (Addendum)
[facility] Discharge Summary  Patient name: [name] Medical record number: [id number] Date of birth: April 05, 1946 Age: 75 y.o. Gender: male Date of Admission: 04/01/2021  Date of Discharge: 04/06/21 Admitting Physician: Orvis Brill, DO  Primary Care Provider: [name], MD Consultants: Orthopedic surgery  Indication for Hospitalization: Fall on left hip  Discharge Diagnoses/Problem List:  Active Problems:   Closed left hip fracture, initial encounter [facility])   Closed nondisplaced intertrochanteric fracture of left femur ([place])   Fall   Disposition: Home with HHPT  Discharge Condition: Stable  Discharge Exam:  Blood pressure 136/60, pulse (!) 102, temperature 97.9 F (36.6 C), temperature source Oral, resp. rate 18, height '5\' 7"'$  (1.702 m), weight 64.4 kg, SpO2 97 %.   General: Well-appearing, no acute distress, resting comfortably in the bed CV: RRR Resp: CTAB, normal work of breathing Abd: Soft, non-tender, non-distended Ext: Warm, dry, well-perfused. Dressing dry and intact over left hip incision site. MSK: Knee immobilizer on left knee  Brief Hospital Course:  [name] is a 75 y.o. male who presented to [facility] after a fall in which he sustained a left hip fracture. PMH is significant for CAD s/p CABG, PAD, hypertension, hyperlipidemia, DM2 & CKD, stage II.   Left hip fracture X-ray confirmed left hip fracture on admission.  Patient was evaluated by orthopedic surgery who recommended surgery after 48 hours of holding Eliquis.  Patient was not completely sure whether or not he had taken this recently. He had a left hip intramedullary nail placement with orthopedic surgery on 04/03/21 under general anesthesia with no complications. He received oxycodone '5mg'$  PRN and Tylenol for pain control. At the time of discharge, his pain was well-controlled with Tylenol, he was eating and drinking normally and he was hemodynamically stable. Patient was  evaluated by physical therapy and Occupational Therapy following his surgery with recommendations for Home Health PT.  CAD s/p CABG and history of PAD Due to history of CAD s/p CABG and history of PAD, patient was recommended for cardiac preoperative evaluation.  Cardiology was consulted and evaluated the patient on 8/20.  Patient was determined to be low risk and acceptable to proceed with hip surgery scheduled for 8/21.  Patient's aspirin, Pletal and Plavix were recommended to be held prior to surgery and resume postoperatively.  Patient denied anginal symptoms at the time of admission. His medications were resumed on 04/04/21.  Issues for PCP f/u  Patient instructed to discontinue taking Amlodipine and HCTZ due to low blood pressure (70s/40s) while working with PT/OT, until follow-up appointment with PCP, at that time can be adjusted as necessary. Pt will need outpatient cardiology follow up with Dr. Gwenlyn Found to confirm if he should continue ASA,Plavix and Pletal therapy.  Please confirm patient is receiving home health physical therapy after hip replacement.  Patient had elevated creatinine and concern for doses of gabapentin and worsening renal function. Please check BMP for creatinine and consider decreasing gabapentin dose.  Patient's blood pressure medications were held during admission. Please adjust as deemed appropriate at outpatient follow up visit.    Significant Procedures: Left hip intramedullary nail placement  Significant Labs and Imaging:  Recent Labs  Lab 04/03/21 0245 04/04/21 0136 04/06/21 0339  WBC 12.6* 13.6* 9.3  HGB 14.1 11.9* 10.9*  HCT 42.0 34.9* 31.8*  PLT 277 285 310   Recent Labs  Lab 04/01/21 1507 04/03/21 0245 04/04/21 0136 04/05/21 0225 04/05/21 0639 04/06/21 0339  NA 136 133* 133* 133*  --  135  K 4.0 3.7 4.1 3.3*  --  3.2*  CL 99 97* 96* 98  --  98  CO2 20* '24 22 24  '$ --  25  GLUCOSE 160* 149* 155* 179*  --  120*  BUN 16 19 27* 30*  --  36*   CREATININE 1.24 1.29* 1.39* 1.50*  --  1.46*  CALCIUM 8.5* 9.1 8.5* 8.6*  --  9.2  MG  --   --   --   --  1.8  --       Results/Tests Pending at Time of Discharge: None  Discharge Medications:  Allergies as of 04/06/2021   No Active Allergies      Medication List     STOP taking these medications    acetaminophen 325 MG tablet Commonly known as: TYLENOL   albuterol 108 (90 Base) MCG/ACT inhaler Commonly known as: VENTOLIN HFA   amLODipine 10 MG tablet Commonly known as: NORVASC   clopidogrel 75 MG tablet Commonly known as: PLAVIX   hydrochlorothiazide 12.5 MG tablet Commonly known as: HYDRODIURIL       TAKE these medications    aspirin EC 81 MG tablet Take 1 tablet (81 mg total) by mouth daily with breakfast. What changed: when to take this   atorvastatin 40 MG tablet Commonly known as: LIPITOR Take 1 tablet (40 mg total) by mouth daily at 6 PM. What changed: when to take this   cilostazol 100 MG tablet Commonly known as: PLETAL Take 100 mg by mouth 2 (two) times daily.   empagliflozin 25 MG Tabs tablet Commonly known as: JARDIANCE Take 12.5 mg by mouth daily.   ferrous sulfate 325 (65 FE) MG tablet Take 325 mg by mouth 3 (three) times daily.   gabapentin 600 MG tablet Commonly known as: NEURONTIN Take 600-1,200 mg by mouth See admin instructions. Take one tablet (600 mg) by mouth at morning and noon, take two tablets (1200 mg) at bedtime   insulin glargine 100 UNIT/ML injection Commonly known as: LANTUS Inject 20 Units into the skin at bedtime.   losartan 50 MG tablet Commonly known as: COZAAR Take 50 mg by mouth daily.   metFORMIN 1000 MG tablet Commonly known as: GLUCOPHAGE Take 1,000 mg by mouth 2 (two) times daily with a meal.   metoprolol tartrate 50 MG tablet Commonly known as: LOPRESSOR Take 25 mg by mouth 2 (two) times daily.   Ozempic (0.25 or 0.5 MG/DOSE) 2 MG/1.5ML Sopn Generic drug: Semaglutide(0.25 or 0.'5MG'$ /DOS) Inject  0.25 mg into the skin every Sunday. Sunday night   pantoprazole 40 MG tablet Commonly known as: PROTONIX Take 1 tablet (40 mg total) by mouth daily.   vitamin B-12 1000 MCG tablet Commonly known as: CYANOCOBALAMIN Take 1,000 mcg by mouth daily.               Durable Medical Equipment  (From admission, onward)           Start     Ordered   04/05/21 1419  For home use only DME lightweight manual wheelchair with seat cushion  Once       Comments: Patient suffers from  left hip intertrochanteric fracture which impairs their ability to perform daily activities like dressing in the home.  A walker will not resolve  issue with performing activities of daily living. A wheelchair will allow patient to safely perform daily activities. Patient is not able to propel themselves in the home using a standard weight wheelchair due to general  weakness. Patient can self propel in the lightweight wheelchair. Length of need 12 months . Accessories: elevating leg rests (ELRs), wheel locks, extensions and anti-tippers.   04/05/21 1419   04/03/21 1752  For home use only DME 3 n 1  Once        04/03/21 1751   04/03/21 1752  For home use only DME Walker rolling  Once       Question Answer Comment  Walker: With [place]   Patient needs a walker to treat with the following condition History of hip replacement      04/03/21 1751              Discharge Care Instructions  (From admission, onward)           Start     Ordered   04/06/21 0000  Discharge wound care:       Comments: Recommendations per Dr. Ninfa Linden to Mr. Emerine.   04/06/21 1450            Discharge Instructions: Please refer to Patient Instructions section of EMR for full details.  Patient was counseled important signs and symptoms that should prompt return to medical care, changes in medications, dietary instructions, activity restrictions, and follow up appointments.   Follow-Up Appointments:  Follow-up  Information     Mcarthur Rossetti, MD Follow up in 2 week(s).   Specialty: Orthopedic Surgery Contact information: [place] [address] [phone]         [name], MD. Schedule an appointment as soon as possible for a visit in 1 week(s).   Specialty: Nurse Practitioner Why: for repeat blood work to check renal function Contact information: [place] [address] [phone]         Lorretta Harp, MD .   Specialties: Cardiology, Radiology Contact information: [address] [place] [address] [phone]                 Orvis Brill, [place] 04/06/2021, 9:00 AM PGY-1, [place]

## 2021-04-05 ENCOUNTER — Telehealth: Payer: Self-pay | Admitting: Orthopaedic Surgery

## 2021-04-05 ENCOUNTER — Other Ambulatory Visit: Payer: Self-pay | Admitting: Orthopaedic Surgery

## 2021-04-05 DIAGNOSIS — W19XXXA Unspecified fall, initial encounter: Secondary | ICD-10-CM | POA: Diagnosis not present

## 2021-04-05 DIAGNOSIS — S72002A Fracture of unspecified part of neck of left femur, initial encounter for closed fracture: Secondary | ICD-10-CM | POA: Diagnosis not present

## 2021-04-05 DIAGNOSIS — S72145A Nondisplaced intertrochanteric fracture of left femur, initial encounter for closed fracture: Secondary | ICD-10-CM | POA: Diagnosis not present

## 2021-04-05 LAB — BASIC METABOLIC PANEL
Anion gap: 11 (ref 5–15)
BUN: 30 mg/dL — ABNORMAL HIGH (ref 8–23)
CO2: 24 mmol/L (ref 22–32)
Calcium: 8.6 mg/dL — ABNORMAL LOW (ref 8.9–10.3)
Chloride: 98 mmol/L (ref 98–111)
Creatinine, Ser: 1.5 mg/dL — ABNORMAL HIGH (ref 0.61–1.24)
GFR, Estimated: 49 mL/min — ABNORMAL LOW (ref >60)
Glucose, Bld: 179 mg/dL — ABNORMAL HIGH (ref 70–99)
Potassium: 3.3 mmol/L — ABNORMAL LOW (ref 3.5–5.1)
Sodium: 133 mmol/L — ABNORMAL LOW (ref 135–145)

## 2021-04-05 LAB — GLUCOSE, CAPILLARY
Glucose-Capillary: 152 mg/dL — ABNORMAL HIGH (ref 70–99)
Glucose-Capillary: 175 mg/dL — ABNORMAL HIGH (ref 70–99)
Glucose-Capillary: 168 mg/dL — ABNORMAL HIGH (ref 70–99)
Glucose-Capillary: 119 mg/dL — ABNORMAL HIGH (ref 70–99)

## 2021-04-05 LAB — MAGNESIUM: Magnesium: 1.8 mg/dL (ref 1.7–2.4)

## 2021-04-05 MED ORDER — SODIUM CHLORIDE 0.9 % IV BOLUS
1000.0000 mL | Freq: Once | INTRAVENOUS | Status: AC
  Administered 2021-04-05: 1000 mL via INTRAVENOUS

## 2021-04-05 MED ORDER — OXYCODONE HCL 5 MG PO TABS: 5.0000 mg | ORAL_TABLET | Freq: Four times a day (QID) | ORAL | 0 refills | Status: DC | PRN

## 2021-04-05 MED ORDER — POTASSIUM CHLORIDE CRYS ER 20 MEQ PO TBCR
40.0000 meq | EXTENDED_RELEASE_TABLET | Freq: Once | ORAL | Status: AC
  Administered 2021-04-05: 40 meq via ORAL
  Filled 2021-04-05: qty 2

## 2021-04-05 NOTE — Progress Notes (Signed)
Orthopedic Tech Progress Note Patient Details:  [name] 24-Apr-1946 [id number]  Ortho Devices Type of Ortho Device: Knee Immobilizer Ortho Device/Splint Location: LLE Ortho Device/Splint Interventions: Ordered, Application, Adjustment   Post Interventions Patient Tolerated: Well Instructions Provided: Care of [place] 04/05/2021, 3:29 PM

## 2021-04-05 NOTE — Progress Notes (Signed)
FPTS Brief Progress Note  S: Patient is postop day 3 for a nondisplaced intertrochanteric left hip fracture repair.  He reports that he is doing well and pain is well controlled.  Denies any other episode of hypotension.   Earlier today had an episode of orthostatic hypotension likely secondary to dehydration during OT session.  Blood pressure got down to 76/52.  He received  1 L normal saline bolus and was encouraged to drink fluids.   O: BP (!) 106/48 (BP Location: Left Arm)   Pulse 88   Temp 97.8 F (36.6 C) (Oral)   Resp 16   Ht '5\' 7"'$  (1.702 m)   Wt 64.4 kg   SpO2 95%   BMI 22.24 kg/m   General: Resting comfortably in bed   A/P: - Hold HCTZ and amlodipine tomorrow 8/24 per day team  - Orders reviewed. Labs for AM ordered, which was adjusted as needed.  -Plan per day team  Erskine Emery, MD 04/06/2021, 1:15 AM PGY-1, [facility] Family Medicine Night Resident  Please page [phone] with questions.

## 2021-04-05 NOTE — Telephone Encounter (Signed)
[facility] in [place] called and states that she needs you to add on the oxycodone script that pt cant take no more that 6 tablets a day for insurance purposes.   CB [phone]

## 2021-04-05 NOTE — Progress Notes (Addendum)
Occupational Therapy Treatment Patient Details Name: CAYNE KLESS MRN: [id number] DOB: 20-Dec-1945 Today's Date: 04/05/2021    History of present illness Mr. Warden is a 75 year old male who presented with left hip fracture s/p fall.  IM Nail for fixation done 04/03/2021, WBAT.  PMH includes extensive CAD s/p CABG x5, CKD stage III, diverticulitis, HTN, GERD, HLD, malnutrition, PAD, PVD, T2DM, diabetic neuropathy   OT comments  [name] is progressing towards his OT goals, with possible plans to d/c home today or tomorrow. Session initially focused on gait belt family education, and AE education to increased indep with lower body dressing. Pt sitting upright with legs elevated inially, after putting his feet on the floor, pt had report of feeling hot and nauseous. Emsis bag given. As therapist was reclining pt back with feet elevated, pt had brief moment of passing out. RN notified. After a few seconds of laying flat with a cool rag, pt became asymptomatic with BP of 101/60. After 5 minutes of laying flat pt's BP was 78/48, still asymptomatic. Pt stable with RN's attention at the end of the session. He continues to benefit from OT. D/c plan remains appropriate.     Follow Up Recommendations  Home health OT    Equipment Recommendations  3 in 1 bedside commode;Other (comment)       Precautions / Restrictions Precautions Precautions: Fall Precaution Comments: watch BP - tanked during OT session after working with PT Restrictions Weight Bearing Restrictions: Yes LLE Weight Bearing: Weight bearing as tolerated       Mobility Bed Mobility Overal bed mobility: Needs Assistance             General bed mobility comments: chair level this session    Transfers       General transfer comment: defered this session due to low BP    Balance Overall balance assessment: Needs assistance Sitting-balance support: Feet supported Sitting balance-Leahy Scale: Poor Sitting balance - Comments: poor  this session due to low BP               ADL either performed or assessed with clinical judgement   ADL Overall ADL's : Needs assistance/impaired                     Lower Body Dressing: Moderate assistance;With adaptive equipment;Cueing for safety;Cueing for sequencing;Cueing for compensatory techniques;Sit to/from stand Lower Body Dressing Details (indicate cue type and reason): Reviewed with pt and mom sock aide, reacher and long shoe horn to incrased indep with lower body dressing tasks             Functional mobility during ADLs:  (chiar level this session due to vitals) General ADL Comments: session focused on AE education and gait belt education for incrased safety when walking in the home with w RW      Cognition Arousal/Alertness: Awake/alert Behavior During Therapy: WFL for tasks assessed/performed Overall Cognitive Status: Within Functional Limits for tasks assessed               General Comments pt's wife was educated on gait belt uses and hand hold for safest assistance when walking due to L knee buckling. pt with report of feeling nauseous while sitting in the chair and had a brief moment of loss of conciousness. After reclining pt and applying a cool rag, he sysmtoms resolved however his BP remained low. RN and team notified.    Pertinent Vitals/ Pain       Pain Assessment: Faces  Faces Pain Scale: Hurts a little bit Pain Location: L hip Pain Intervention(s): Repositioned;Monitored during session;Limited activity within patient's tolerance   Frequency  Min 2X/week        Progress Toward Goals  OT Goals(current goals can now be found in the care plan section)  Progress towards OT goals: Progressing toward goals  Acute Rehab OT Goals Patient Stated Goal: go home OT Goal Formulation: With patient/family Time For Goal Achievement: 04/17/21 Potential to Achieve Goals: Good ADL Goals Pt Will Perform Lower Body Bathing: with min assist;sit  to/from stand;sitting/lateral leans Pt Will Perform Lower Body Dressing: with min assist;sitting/lateral leans;sit to/from stand Pt Will Transfer to Toilet: with min assist;stand pivot transfer Pt Will Perform Toileting - Clothing Manipulation and hygiene: with min assist;sitting/lateral leans;sit to/from stand  Plan Discharge plan remains appropriate       AM-PAC OT "6 Clicks" Daily Activity     Outcome Measure   Help from another person eating meals?: A Little Help from another person taking care of personal grooming?: A Little Help from another person toileting, which includes using toliet, bedpan, or urinal?: A Little Help from another person bathing (including washing, rinsing, drying)?: A Little Help from another person to put on and taking off regular upper body clothing?: None Help from another person to put on and taking off regular lower body clothing?: A Little 6 Click Score: 19    End of Session Equipment Utilized During Treatment:  (AE)  OT Visit Diagnosis: Unsteadiness on feet (R26.81);Other abnormalities of gait and mobility (R26.89);Muscle weakness (generalized) (M62.81);Pain   Activity Tolerance Treatment limited secondary to medical complications (Comment)   Patient Left in chair;with call bell/phone within reach;with chair alarm set;with nursing/sitter in room   Nurse Communication Mobility status;Patient requests pain meds        Time: 1417-1450 OT Time Calculation (min): 33 min  Charges: OT General Charges $OT Visit: 1 Visit OT Treatments $Self Care/Home Management : 23-37 mins     Shamarr Faucett A Helen Winterhalter 04/05/2021, 3:10 PM

## 2021-04-05 NOTE — Progress Notes (Signed)
Patient ID: [name], male   DOB: 1946/04/13, 75 y.o.   MRN: [id number] The patient is doing well postop day 2 status post open reduction/internal fixation of a left hip intertrochanteric fracture.  His incision looks good over the left hip and I placed a new dressing.  I talked to him in length about his fracture and postoperative course.  He can be up with therapy and with assistance walking with a walker and weightbearing as tolerated.  Discharge is pending therapy in terms of is a safe enough to be discharged to home with home health therapy.  He is on Plavix and should take this and a baby aspirin daily for DVT coverage.

## 2021-04-05 NOTE — Progress Notes (Addendum)
Physical Therapy Treatment Patient Details Name: [name] MRN: [id number] DOB: [birth year] Today's Date: 04/05/2021    History of Present Illness [name] is a 75 year old male who presented with left hip fracture s/p fall.  IM Nail for fixation done 04/03/2021, WBAT.  PMH includes extensive CAD s/p CABG x5, CKD stage III, diverticulitis, HTN, GERD, HLD, malnutrition, PAD, PVD, T2DM, diabetic neuropathy    PT Comments    Pt supine in bed and eager to mobilize to return home.  Focused on LE HEP, gt training and progression to stair training.  He did present with strong buckle in L stance phase during gt progression.  Issued HEP for home use.  Pt able to climb 5 stairs with assistance.  He denies dizziness this session but did require several rest breaks due to fatigue, will ask for [facility] for home use until quad strength improves.    Follow Up Recommendations  Home health PT;Supervision/Assistance - 24 hour     Equipment Recommendations  Rolling walker with 5" wheels;3in1 (PT);Wheelchair (measurements PT);Wheelchair cushion (measurements PT)    Recommendations for Other Services       Precautions / Restrictions Precautions Precautions: Fall Precaution Comments: watch BP - tanked during OT session after working with PT Restrictions Weight Bearing Restrictions: Yes LLE Weight Bearing: Weight bearing as tolerated    Mobility  Bed Mobility Overal bed mobility: Needs Assistance Bed Mobility: Supine to Sit     Supine to sit: Supervision     General bed mobility comments: Increased time and effort but able to rise into sitting with heavy use of UEs and gt belt to advance LLE to edge of bed.    Transfers Overall transfer level: Needs assistance Equipment used: Rolling walker (2 wheeled) Transfers: Sit to/from Stand Sit to Stand: Min guard;Min assist         General transfer comment: Cues for hand placement to and from seated surface this session.  Pt required sit to stand  from edge of bed and from chair in [place].  Ambulation/Gait Ambulation/Gait assistance: Min assist;Mod assist Gait Distance (Feet): 60 Feet (+ 20 ft) Assistive device: Rolling walker (2 wheeled) Gait Pattern/deviations: Step-to pattern;Trunk flexed;Antalgic     General Gait Details: Buckling noted in L stance phase.  This was more prominent as distance progressed and he required increased assistance to correct it.   Stairs Stairs: Yes Stairs assistance: Mod assist Stair Management: One rail Right Number of Stairs: 5 General stair comments: Cues for sequencing and use of R railing.  No buckling noted on stairs.  Forward to ascend and backward to descend.   Wheelchair Mobility    Modified Rankin (Stroke Patients Only)       Balance Overall balance assessment: Needs assistance Sitting-balance support: Feet supported Sitting balance-Leahy Scale: Good Sitting balance - Comments: poor this session due to low BP     Standing balance-Leahy Scale: Poor Standing balance comment: Heavy use of B UEs due to buckling in L stance phase.                            Cognition Arousal/Alertness: Awake/alert Behavior During Therapy: WFL for tasks assessed/performed Overall Cognitive Status: Within Functional Limits for tasks assessed                                 General Comments: Reports feeling tired but no other complaints.  Exercises General Exercises - Lower Extremity Ankle Circles/Pumps: AROM;Both;20 reps;Supine Quad Sets: AROM;Left;10 reps;Supine Heel Slides: AAROM;Left;10 reps;Supine Hip ABduction/ADduction: AAROM;Left;10 reps;Supine    General Comments       Pertinent Vitals/Pain Pain Assessment: Faces Pain Score: 6  Faces Pain Scale: Hurts a little bit Pain Location: L hip Pain Descriptors / Indicators: Grimacing;Guarding Pain Intervention(s): Monitored during session;Repositioned    Home Living                      Prior  Function            PT Goals (current goals can now be found in the care plan section) Acute Rehab PT Goals Patient Stated Goal: go home Potential to Achieve Goals: Good Progress towards PT goals: Progressing toward goals    Frequency    Min 6X/week      PT Plan Current plan remains appropriate    Co-evaluation              AM-PAC PT "6 Clicks" Mobility   Outcome Measure  Help needed turning from your back to your side while in a flat bed without using bedrails?: A Little Help needed moving from lying on your back to sitting on the side of a flat bed without using bedrails?: A Little Help needed moving to and from a bed to a chair (including a wheelchair)?: A Little Help needed standing up from a chair using your arms (e.g., wheelchair or bedside chair)?: A Little Help needed to walk in hospital room?: A Little Help needed climbing 3-5 steps with a railing? : A Lot 6 Click Score: 17    End of Session Equipment Utilized During Treatment: Gait belt Activity Tolerance: Patient limited by pain Patient left: with call bell/phone within reach;in chair;with chair alarm set Nurse Communication: Mobility status PT Visit Diagnosis: Unsteadiness on feet (R26.81);Other abnormalities of gait and mobility (R26.89);Pain Pain - Right/Left: Left Pain - part of body: Hip     Time: 1347-1410 PT Time Calculation (min) (ACUTE ONLY): 23 min  Charges:  $Gait Training: 8-22 mins $Therapeutic Exercise: 8-22 mins                     [name] , PTA Acute Rehabilitation Services Pager [phone] Office [phone]    [name] [name] 04/05/2021, 3:25 PM

## 2021-04-05 NOTE — Progress Notes (Addendum)
Interim Progress Note  Received update from RN and OT that patient was hypotensive and dizzy during OT session. Systolic BP [id number], HR 59. On recheck and after laying flat for greater than 5 minutes it decreased to 78/48. Manual BP 76/52. In to see patient, he is laying in bed and feels back to normal. He states that he started to get dizzy during the OT session but has been feeling not so good all day. No chest pain or trouble breathing. He got dizzy but didn't feel like the room was spinning. His wife thinks maybe he passed out when he was sitting in the chair but the patient denies this. He says once they had him lay down things got better. He doesn't think he has been drinking enough.   O:  Blood pressure (!) 97/51, pulse 84, temperature (!) 97.5 F (36.4 C), temperature source Oral, resp. rate 18, height '5\' 7"'$  (1.702 m), weight 64.4 kg, SpO2 99 %. Gen: NAD, pleasant, conversational, not diaphoretic Cardio: RRR Resp: Breathing comfortably on room air, no respiratory distress  A/P: Orthostatic Hypotension Likely 2/2 dehydration s/p surgical fixation. Also evidenced by increasing creatinine since he has been here. Will proceed with 1L NS bolus. Encouraged patient to drink fluids as well. His wife is at the bedside and will also encourage him. Reassuring that symptoms have now resolved. We will plan to keep him overnight. Hold HCTZ and Amlodipine tomorrow (has already received all his BP medications today). Plan for him to work with PT/OT again prior to d/c and see how he does. Will continue to monitor vitals.

## 2021-04-05 NOTE — TOC Initial Note (Addendum)
Transition of Care [facility]) - Initial/Assessment Note    Patient Details  Name: [name] MRN: [id number] Date of Birth: 12/04/1945  [facility] of [facility]) CM/SW Contact:    Sharin Mons, RN Phone Number: 04/05/2021, 1:24 PM  Clinical Narrative:                   -s/p ORIF of a left hip intertrochanteric fracture From home with wife. Pt active with [facility]. PCP: Garey Ham. Transfer Coordinator  Leana Roe or Joelene Millin [phone]). Pt is 100% service connected.  Patient will DC to: HOME Anticipated DC date: 04/05/2021 Family notified: Yes, wife Transport by: car  Per MD patient ready for DC today . RN, patient, patient's family, and home health agency aware of DC.    04/05/2021 1600 d/c cancelled 2/2 orthostatic hypotension.  04/06/2021 1100 NCM called and made Transfer Coordinator Tracie aware of d/c plan.  04/06/2021 '@1254'$  Referral faxed to [facility] for DME W/C. Per Leana Roe once processed equipment will be delivered to pt home, wife made aware.  RNCM will sign off for now as intervention is no longer needed. Please consult [place] again if new needs arise.   Expected Discharge Plan: [place] Barriers to Discharge: No Barriers Identified   Patient Goals and CMS Choice     Choice offered to / list presented to : Patient  Expected Discharge Plan and Services Expected Discharge Plan: [place]   Discharge Planning Services: CM Consult                     DME Arranged: 3-N-1, Walker rolling   Date DME Agency Contacted: 04/05/21 Time DME Agency Contacted: [phone] Representative spoke with at DME Agency: Freda Munro HH Arranged: PT [place] Agency: [place] Date [place]: 04/05/21 Time [place]: [phone] Representative spoke with at [place]: [place] Arrangements/Services   Lives with:: Spouse Patient language and need for interpreter reviewed:: Yes        Need for Family  Participation in Patient Care: Yes (Comment) Care giver support system in place?: Yes (comment)   Criminal Activity/Legal Involvement Pertinent to Current Situation/Hospitalization: No - Comment as needed  Activities of Daily Living Home Assistive Devices/Equipment: None ADL Screening (condition at time of admission) Patient's cognitive ability adequate to safely complete daily activities?: Yes Is the patient deaf or have difficulty hearing?: No Does the patient have difficulty seeing, even when wearing glasses/contacts?: No Does the patient have difficulty concentrating, remembering, or making decisions?: No Patient able to express need for assistance with ADLs?: Yes Does the patient have difficulty dressing or bathing?: No Independently performs ADLs?: Yes (appropriate for developmental age) Does the patient have difficulty walking or climbing stairs?: No Weakness of Legs: None Weakness of Arms/Hands: None  Permission Sought/Granted   Permission granted to share information with : Yes, Verbal Permission Granted  Share Information with NAME: Zaylyn Erney (Spouse) [phone]           Emotional Assessment Appearance:: Appears stated age Attitude/Demeanor/Rapport: Engaged Affect (typically observed): Calm Orientation: : Oriented to Self, Oriented to Place, Oriented to  Time, Oriented to Situation Alcohol / Substance Use: Not Applicable Psych Involvement: No (comment)  Admission diagnosis:  Fall, initial encounter [W19.XXXA] Closed nondisplaced intertrochanteric fracture of left femur, initial encounter ([place]) [S72.145A] Closed left hip fracture, initial encounter [facility]) [S72.002A] Patient Active Problem List   Diagnosis Date Noted   Fall  Closed nondisplaced intertrochanteric fracture of left femur (HCC)    Closed left hip fracture, initial encounter ([place]) 04/01/2021   CAP (community acquired pneumonia) 05/21/2019   Diarrhea 03/18/2019   Loss of weight 03/18/2019    Klebsiella sepsis ([place]) 09/15/2018   Urinary tract infection without hematuria    Acute lower UTI 09/13/2018   S/P colostomy takedown 03/26/2018   Hypertension 12/12/2017   Type 2 diabetes mellitus with diabetic neuropathy ([place]) 12/12/2017   CKD (chronic kidney disease), stage III ([place]) 12/12/2017   Hypercholesteremia 12/12/2017   Hypokalemia 12/12/2017   GI bleed 12/06/2017   Rectal bleed 12/06/2017   Uncontrolled type 2 diabetes mellitus with hyperglycemia, with long-term current use of insulin ([place]) 12/06/2017   Acute renal failure superimposed on stage 3 chronic kidney disease ([place]) 12/06/2017   Malnutrition of moderate degree 12/06/2017   Abnormal CT scan, colon 11/23/2017   Constipation 11/23/2017   Lobar pneumonia ([place]) 10/02/2017   Diverticulitis large intestine 08/19/2017   Diverticulitis of large intestine with perforation and abscess without bleeding    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 08/16/2017   CKD (chronic kidney disease) stage 3, GFR 30-59 ml/min (HCC) 08/02/2017   Sepsis due to undetermined organism ([place]) 08/01/2017   GERD (gastroesophageal reflux disease) 08/01/2017   PAD (peripheral artery disease) ([place]) 08/09/2016   Claudication ([place]) 05/08/2016   HCAP (healthcare-associated pneumonia) 01/12/2016   Hyponatremia 01/12/2016   Abnormal chest CT 10/13/2015   Hx of CABG 10/13/2015   Type 2 diabetes, HbA1c goal < 7% (HCC)    Unstable angina (HCC)    Hypomagnesemia    Type 2 diabetes mellitus ([place]) 10/09/2015   ASCVD (arteriosclerotic cardiovascular disease) 10/09/2015   CKD (chronic kidney disease) stage 2, GFR 60-89 ml/min 10/09/2015   Sepsis ([place]) 07/09/2015   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 07/09/2015   PVD (peripheral vascular disease) ([place]) 07/09/2015   Essential hypertension 07/09/2015   Neuropathy, peripheral 07/09/2015   HLD (hyperlipidemia) 07/09/2015   AKI (acute kidney injury) ([place]) 07/09/2015   CAD S/P percutaneous coronary  angioplasty 08/14/1996   PCP:  [name], MD Pharmacy:   [facility] [address] [place] [address] Phone: [phone] Fax: [place], [place] [address] Phone: [phone] Fax: [place] [address] [place] [address] Phone: [phone] Fax: [phone]     Social Determinants of Health (SDOH) Interventions    Readmission Risk Interventions No flowsheet data found.

## 2021-04-05 NOTE — Progress Notes (Signed)
Family Medicine Teaching Service Daily Progress Note Intern Pager: [phone]  Patient name: [name] Medical record number: [id number] Date of birth: 02-21-1946 Age: 75 y.o. Gender: male  Primary Care Provider: [name], MD Consultants: Ortho Code Status: Full  Pt Overview and Major Events to Date:  8/19: Admitted, triple APT held 8/21: OR with ortho for left hip intramedullary nail placement 8/23: triple APT resumed  Assessment and Plan: [name] is a 75 year old male who presented with left hip fracture s/p fall.  Awaiting OR today at 930.  PMH includes extensive CAD s/p CABG x5, CKD stage III, diverticulitis, HTN, GERD, HLD, malnutrition, PAD, PVD, T2DM, diabetic neuropathy.  Left hip fracture s/p left hip intramedullary nail placement POD day #2. Pain well controlled. Eating normally. Worked with PT and improving. Will benefit from HHPT, wheelchair, knee immobilizer at discharge. No BM yet, but passing flatus. Dressing changed by Dr. Ninfa Linden this morning.  - Medically stable for discharge on Plavix and ASA for DVT prophylaxis per Dr. Ninfa Linden.  - Continue Tylenol as needed for pain - PT/OT  CABG s/p CABG x4 PAD, PVD Restarted ASA, Plavix, Pletal today  HTN BP well-controlled- 130/68 -Continue home meds  T2DM CBG 130s-150s - [place] CBG check 4 times daily  FEN/GI: Carb modified heart healthy PPx: SCDs Dispo:Home with home health  today. Barriers include none.   Subjective:  Marquis feels well this morning, pain well-controlled. Drinking coffee and preparing to have breakfast. Says he is urinating normally "too much." No BM yet, passing flatus.  Objective: Temp:  [97.5 F (36.4 C)-97.9 F (36.6 C)] 97.5 F (36.4 C) (08/23 1430) Pulse Rate:  [75-109] 84 (08/23 1449) Resp:  [16-18] 18 (08/23 1430) BP: (76-130)/(51-69) 97/51 (08/23 1449) SpO2:  [95 %-100 %] 99 % (08/23 1430) Physical Exam: General: Well-appearing, no acute distress, sitting upright in bed  with breakfast tray Cardiovascular: RRR Respiratory: No increased work of breathing, no diminished lung sounds, no wheezing or crackles Abdomen: Soft, nontender Extremities: Warm, dry well perfused MSK: Minor bruising over left hip, dry dressing in place over incision site  Laboratory: Recent Labs  Lab 04/02/21 0024 04/03/21 0245 04/04/21 0136  WBC 10.9* 12.6* 13.6*  HGB 13.1 14.1 11.9*  HCT 39.1 42.0 34.9*  PLT 289 277 285   Recent Labs  Lab 04/03/21 0245 04/04/21 0136 04/05/21 0225  NA 133* 133* 133*  K 3.7 4.1 3.3*  CL 97* 96* 98  CO2 '24 22 24  '$ BUN 19 27* 30*  CREATININE 1.29* 1.39* 1.50*  CALCIUM 9.1 8.5* 8.6*  GLUCOSE 149* 155* 179*     Imaging/Diagnostic Tests: No results found.   Orvis Brill, DO 04/05/2021, 2:55 PM PGY-1, [place] Intern pager: [phone], text pages welcome

## 2021-04-05 NOTE — Plan of Care (Signed)

## 2021-04-05 NOTE — Plan of Care (Signed)
  Problem: Health Behavior/Discharge Planning: Goal: Ability to manage health-related needs will improve Outcome: Progressing   Problem: Activity: Goal: Risk for activity intolerance will decrease Outcome: Progressing   Problem: Safety: Goal: Ability to remain free from injury will improve Outcome: Progressing   

## 2021-04-06 LAB — CBC
HCT: 31.8 % — ABNORMAL LOW (ref 39.0–52.0)
Hemoglobin: 10.9 g/dL — ABNORMAL LOW (ref 13.0–17.0)
MCH: 30.4 pg (ref 26.0–34.0)
MCHC: 34.3 g/dL (ref 30.0–36.0)
MCV: 88.8 fL (ref 80.0–100.0)
Platelets: 310 10*3/uL (ref 150–400)
RBC: 3.58 MIL/uL — ABNORMAL LOW (ref 4.22–5.81)
RDW: 14.8 % (ref 11.5–15.5)
WBC: 9.3 10*3/uL (ref 4.0–10.5)
nRBC: 0 % (ref 0.0–0.2)

## 2021-04-06 LAB — BASIC METABOLIC PANEL
Anion gap: 12 (ref 5–15)
BUN: 36 mg/dL — ABNORMAL HIGH (ref 8–23)
CO2: 25 mmol/L (ref 22–32)
Calcium: 9.2 mg/dL (ref 8.9–10.3)
Chloride: 98 mmol/L (ref 98–111)
Creatinine, Ser: 1.46 mg/dL — ABNORMAL HIGH (ref 0.61–1.24)
GFR, Estimated: 50 mL/min — ABNORMAL LOW (ref >60)
Glucose, Bld: 120 mg/dL — ABNORMAL HIGH (ref 70–99)
Potassium: 3.2 mmol/L — ABNORMAL LOW (ref 3.5–5.1)
Sodium: 135 mmol/L (ref 135–145)

## 2021-04-06 LAB — GLUCOSE, CAPILLARY
Glucose-Capillary: 116 mg/dL — ABNORMAL HIGH (ref 70–99)
Glucose-Capillary: 153 mg/dL — ABNORMAL HIGH (ref 70–99)

## 2021-04-06 MED ORDER — POTASSIUM CHLORIDE CRYS ER 20 MEQ PO TBCR
40.0000 meq | EXTENDED_RELEASE_TABLET | Freq: Two times a day (BID) | ORAL | Status: DC
  Administered 2021-04-06: 40 meq via ORAL
  Filled 2021-04-06: qty 2

## 2021-04-06 NOTE — Progress Notes (Signed)
Occupational Therapy Treatment Patient Details Name: [name] MRN: [id number] DOB: 1945-12-18 Today's Date: 04/06/2021    History of present illness [name] is a 75 year old male who presented with left hip fracture s/p fall.  IM Nail for fixation done 04/03/2021, WBAT.  PMH includes extensive CAD s/p CABG x5, CKD stage III, diverticulitis, HTN, GERD, HLD, malnutrition, PAD, PVD, T2DM, diabetic neuropathy   OT comments  [name] reports he is feeling much better today. Pt and his wife verbalized understanding of home safety education and fall prevention strategies. Vitals stable this session with transfers. Pt would benefit from continued OT acutely should his length of stay continue. D/c recommend ation remains appropriate.     Follow Up Recommendations  Home health OT    Equipment Recommendations  3 in 1 bedside commode;Other (comment)       Precautions / Restrictions Precautions Precautions: Fall Precaution Comments: watch BP orthostatic Required Braces or Orthoses: Knee Immobilizer - Left Knee Immobilizer - Left: On when out of bed or walking Restrictions Weight Bearing Restrictions: No LLE Weight Bearing: Weight bearing as tolerated       Mobility Bed Mobility Overal bed mobility: Needs Assistance Bed Mobility: Supine to Sit;Sit to Supine     Supine to sit: Supervision     General bed mobility comments: pt in chari upon arrival    Transfers Overall transfer level: Needs assistance Equipment used: Rolling walker (2 wheeled) Transfers: Sit to/from Stand Sit to Stand: Supervision         General transfer comment: Cues for hand placement to and from seated surface.    Balance Overall balance assessment: Needs assistance Sitting-balance support: Feet supported Sitting balance-Leahy Scale: Good       Standing balance-Leahy Scale: Poor Standing balance comment: Heavy use of B UEs             ADL either performed or assessed with clinical judgement    ADL Overall ADL's : Needs assistance/impaired             Toilet Transfer: Min guard;Ambulation;RW;Comfort height toilet;Grab bars Toilet Transfer Details (indicate cue type and reason): incrased time and effort, no physcial assist. minguard for safety           General ADL Comments: Discussed home safety wtih pt and wife: no rugs, no hto showers, supervision/min guard f roall ambulation, urinal for night time use, shower seat, BSC over toilet. PT and wife verbalized understanding of all and were appreciative of education     Vision   Vision Assessment?: No apparent visual deficits          Cognition Arousal/Alertness: Awake/alert Behavior During Therapy: WFL for tasks assessed/performed Overall Cognitive Status: Within Functional Limits for tasks assessed Area of Impairment: Safety/judgement             Safety/Judgement: Decreased awareness of safety;Decreased awareness of deficits     General Comments: denies orthostatic symptoms        Exercises General Exercises - Lower Extremity Ankle Circles/Pumps: AROM;Both;20 reps;Supine Quad Sets: AROM;Left;10 reps;Supine Heel Slides: AAROM;Left;10 reps;Supine Hip ABduction/ADduction: AAROM;Left;10 reps;Supine      General Comments VSS on RA this session`    Pertinent Vitals/ Pain       Pain Assessment: No/denies pain Faces Pain Scale: Hurts a little bit Pain Location: L hip Pain Descriptors / Indicators: Grimacing;Guarding Pain Intervention(s): Limited activity within patient's tolerance;Monitored during session   Frequency  Min 2X/week        Progress Toward Goals  OT  Goals(current goals can now be found in the care plan section)  Progress towards OT goals: Progressing toward goals  Acute Rehab OT Goals Patient Stated Goal: go home OT Goal Formulation: With patient/family Time For Goal Achievement: 04/17/21 Potential to Achieve Goals: Good ADL Goals Pt Will Perform Lower Body Bathing: with min  assist;sit to/from stand;sitting/lateral leans Pt Will Perform Lower Body Dressing: with min assist;sitting/lateral leans;sit to/from stand Pt Will Transfer to Toilet: with min assist;stand pivot transfer Pt Will Perform Toileting - Clothing Manipulation and hygiene: with min assist;sitting/lateral leans;sit to/from stand  Plan Discharge plan remains appropriate       AM-PAC OT "6 Clicks" Daily Activity     Outcome Measure   Help from another person eating meals?: A Little Help from another person taking care of personal grooming?: A Little Help from another person toileting, which includes using toliet, bedpan, or urinal?: A Little Help from another person bathing (including washing, rinsing, drying)?: A Little Help from another person to put on and taking off regular upper body clothing?: None Help from another person to put on and taking off regular lower body clothing?: A Little 6 Click Score: 19    End of Session Equipment Utilized During Treatment: Gait belt;Rolling walker  OT Visit Diagnosis: Unsteadiness on feet (R26.81);Other abnormalities of gait and mobility (R26.89);Muscle weakness (generalized) (M62.81);Pain Pain - Right/Left: Left Pain - part of body: Hip   Activity Tolerance Patient tolerated treatment well   Patient Left in chair;with call bell/phone within reach;with chair alarm set;with nursing/sitter in room   Nurse Communication Mobility status        Time: [id number] OT Time Calculation (min): 15 min  Charges: OT General Charges $OT Visit: 1 Visit OT Treatments $Self Care/Home Management : 8-22 mins     [name] A [name] 04/06/2021, 3:20 PM

## 2021-04-06 NOTE — Progress Notes (Signed)
Pt was given AVS d/c summary and went over with him. IV was removed with catheter intact. DME 3 in 1 and RW in pt room to go home with pt. Pt had no further questions.

## 2021-04-06 NOTE — Progress Notes (Signed)
Physical Therapy Treatment Patient Details Name: [name] MRN: [id number] DOB: Jul 18, 1946 Today's Date: 04/06/2021    History of Present Illness [name] is a 75 year old male who presented with left hip fracture s/p fall.  IM Nail for fixation done 04/03/2021, [facility]AT.  PMH includes extensive CAD s/p CABG x5, CKD stage III, diverticulitis, HTN, GERD, HLD, malnutrition, PAD, PVD, T2DM, diabetic neuropathy    PT Comments    Pt supine in bed bed on arrival.  Performed orthostatic vital assessment this session.  Pt denies dizziness see below.  Pt able to perform gt training, stair training and LE exercises.   Orthostatic BPs: Supine:144/60 Sitting:98/61 Standing:116/60 Standing after 3 min:112/63 Post session in reclined position:126/60   Follow Up Recommendations  Home health PT;Supervision/Assistance - 24 hour     Equipment Recommendations  Rolling walker with 5" wheels;3in1 (PT);Wheelchair (measurements PT);Wheelchair cushion (measurements PT)    Recommendations for Other Services       Precautions / Restrictions Precautions Precautions: Fall Precaution Comments: watch BP orthostatic Required Braces or Orthoses: Knee Immobilizer - Left Knee Immobilizer - Left: On when out of bed or walking Restrictions Weight Bearing Restrictions: Yes LLE Weight Bearing: Weight bearing as tolerated    Mobility  Bed Mobility Overal bed mobility: Needs Assistance Bed Mobility: Supine to Sit;Sit to Supine     Supine to sit: Supervision     General bed mobility comments: Increased time and effort but able to rise into sitting with heavy use of UEs and gt belt to advance LLE to edge of bed.    Transfers Overall transfer level: Needs assistance Equipment used: Rolling walker (2 wheeled) Transfers: Sit to/from Stand Sit to Stand: Supervision         General transfer comment: Cues for hand placement to and from seated surface.  Ambulation/Gait Ambulation/Gait assistance:  Supervision;Min guard;+2 safety/equipment Gait Distance (Feet): 80 Feet Assistive device: Rolling walker (2 wheeled) Gait Pattern/deviations: Step-to pattern;Trunk flexed;Antalgic     General Gait Details: Buckling noted in L stance phase with in brace but brace did well preventing LOB.  He was followed by chair for safety this session.  Cues for sequencing and RW position.   Stairs Stairs: Yes Stairs assistance: Min guard Stair Management: One rail Right Number of Stairs: 5 General stair comments: Cues for sequencing and use of R railing.  Forward to ascend and backward to descend.   Wheelchair Mobility    Modified Rankin (Stroke Patients Only)       Balance Overall balance assessment: Needs assistance Sitting-balance support: Feet supported Sitting balance-Leahy Scale: Good       Standing balance-Leahy Scale: Poor Standing balance comment: Heavy use of B UEs                            Cognition Arousal/Alertness: Awake/alert Behavior During Therapy: WFL for tasks assessed/performed Overall Cognitive Status: Within Functional Limits for tasks assessed Area of Impairment: Safety/judgement                         Safety/Judgement: Decreased awareness of safety;Decreased awareness of deficits     General Comments: denies dizziness but he is orthostatic during session.      Exercises General Exercises - Lower Extremity Ankle Circles/Pumps: AROM;Both;20 reps;Supine Quad Sets: AROM;Left;10 reps;Supine Heel Slides: AAROM;Left;10 reps;Supine Hip ABduction/ADduction: AAROM;Left;10 reps;Supine    General Comments        Pertinent Vitals/Pain Pain Assessment: Faces  Faces Pain Scale: Hurts a little bit Pain Location: L hip Pain Descriptors / Indicators: Grimacing;Guarding Pain Intervention(s): Monitored during session;Repositioned    Home Living                      Prior Function            PT Goals (current goals can now be  found in the care plan section) Acute Rehab PT Goals Patient Stated Goal: go home Potential to Achieve Goals: Good Progress towards PT goals: Progressing toward goals    Frequency    Min 6X/week      PT Plan Current plan remains appropriate    Co-evaluation              AM-PAC PT "6 Clicks" Mobility   Outcome Measure  Help needed turning from your back to your side while in a flat bed without using bedrails?: A Little Help needed moving from lying on your back to sitting on the side of a flat bed without using bedrails?: A Little Help needed moving to and from a bed to a chair (including a wheelchair)?: A Little Help needed standing up from a chair using your arms (e.g., wheelchair or bedside chair)?: A Little Help needed to walk in hospital room?: A Little Help needed climbing 3-5 steps with a railing? : A Little 6 Click Score: 18    End of Session Equipment Utilized During Treatment: Gait belt Activity Tolerance: Patient limited by pain Patient left: with call bell/phone within reach;in chair;with chair alarm set Nurse Communication: Mobility status PT Visit Diagnosis: Unsteadiness on feet (R26.81);Other abnormalities of gait and mobility (R26.89);Pain Pain - Right/Left: Left Pain - part of body: Hip     Time: 1220-1243 PT Time Calculation (min) (ACUTE ONLY): 23 min  Charges:  $Gait Training: 8-22 mins $Therapeutic Exercise: 8-22 mins                     [name] , PTA Acute Rehabilitation Services Pager [phone] Office [phone]    [name] 04/06/2021, 2:19 PM

## 2021-04-06 NOTE — Plan of Care (Signed)
  Problem: Education: Goal: Knowledge of General Education information will improve Description: Including pain rating scale, medication(s)/side effects and non-pharmacologic comfort measures Outcome: Adequate for Discharge   Problem: Health Behavior/Discharge Planning: Goal: Ability to manage health-related needs will improve Outcome: Adequate for Discharge   Problem: Clinical Measurements: Goal: Ability to maintain clinical measurements within normal limits will improve Outcome: Adequate for Discharge Goal: Will remain free from infection Outcome: Adequate for Discharge Goal: Diagnostic test results will improve Outcome: Adequate for Discharge Goal: Respiratory complications will improve Outcome: Adequate for Discharge Goal: Cardiovascular complication will be avoided Outcome: Adequate for Discharge   Problem: Activity: Goal: Risk for activity intolerance will decrease Outcome: Adequate for Discharge   Problem: Nutrition: Goal: Adequate nutrition will be maintained Outcome: Adequate for Discharge   Problem: Coping: Goal: Level of anxiety will decrease Outcome: Adequate for Discharge   Problem: Elimination: Goal: Will not experience complications related to bowel motility Outcome: Adequate for Discharge Goal: Will not experience complications related to urinary retention Outcome: Adequate for Discharge   Problem: Pain Managment: Goal: General experience of comfort will improve Outcome: Adequate for Discharge   Problem: Safety: Goal: Ability to remain free from injury will improve Outcome: Adequate for Discharge   Problem: Skin Integrity: Goal: Risk for impaired skin integrity will decrease Outcome: Adequate for Discharge   Problem: Education: Goal: Verbalization of understanding the information provided (i.e., activity precautions, restrictions, etc) will improve Outcome: Adequate for Discharge Goal: Individualized Educational Video(s) Outcome: Adequate for  Discharge   Problem: Activity: Goal: Ability to ambulate and perform ADLs will improve Outcome: Adequate for Discharge   Problem: Clinical Measurements: Goal: Postoperative complications will be avoided or minimized Outcome: Adequate for Discharge   Problem: Self-Concept: Goal: Ability to maintain and perform role responsibilities to the fullest extent possible will improve Outcome: Adequate for Discharge   Problem: Pain Management: Goal: Pain level will decrease Outcome: Adequate for Discharge   Problem: Acute Rehab PT Goals(only PT should resolve) Goal: Pt Will Go Supine/Side To Sit Outcome: Adequate for Discharge Goal: Pt Will Go Sit To Supine/Side Outcome: Adequate for Discharge Goal: Patient Will Transfer Sit To/From Stand Outcome: Adequate for Discharge Goal: Pt Will Ambulate Outcome: Adequate for Discharge Goal: Pt Will Go Up/Down Stairs Outcome: Adequate for Discharge Goal: Pt/caregiver will Perform Home Exercise Program Outcome: Adequate for Discharge   Problem: Acute Rehab OT Goals (only OT should resolve) Goal: Pt. Will Perform Lower Body Bathing Outcome: Adequate for Discharge Goal: Pt. Will Perform Lower Body Dressing Outcome: Adequate for Discharge Goal: Pt. Will Transfer To Toilet Outcome: Adequate for Discharge Goal: Pt. Will Perform Toileting-Clothing Manipulation Outcome: Adequate for Discharge

## 2021-04-06 NOTE — Plan of Care (Signed)

## 2021-04-11 ENCOUNTER — Telehealth: Payer: Self-pay

## 2021-04-11 NOTE — Telephone Encounter (Signed)
Weight bearing status

## 2021-04-15 ENCOUNTER — Telehealth: Payer: Self-pay

## 2021-04-15 NOTE — Telephone Encounter (Signed)
Called and scheduled for wed 04/20/21 @ 3pm

## 2021-04-15 NOTE — Telephone Encounter (Signed)
Pt called in wanting to make an appt with Dr. Ninfa Linden at this time he is full until the 19th. Pts wife didn't think he could wait that long to get his stitches out please advise

## 2021-04-16 NOTE — Progress Notes (Deleted)
Cardiology Office Note:    Date:  04/16/2021   ID:  [name], DOB 09-30-1945, MRN [id number]  PCP:  [name], MD  Cardiologist:  Quay Burow, MD  Electrophysiologist:  None   Referring MD: [name], MD   Chief Complaint: follow-up of CAD and PAD  History of Present Illness:    [name] is a 75 y.o. male with a history of CAD s/p stenting to LCX in 2003 and then CABG x5 (LIMA to LAD, SVG to 1st Diag, SVG to OM1 and OM2, SVG to distal RCA) in 10/2015, PAD s/p multiple interventions including left femorofemoral crossover grafting in the 1990s with multiple subsequent stenting, hypertension, hyperlipidemia, type 2 diabetes mellitus, and GERD who is followed by Dr. Gwenlyn Found and presents today for routine follow-up.   Patient has a history of CAD with prior stenting to LCX in 2003 and then CABG x5 in 10/2015. It does not look like he has had an ischemic evaluation since his CABG. He also has a significant history of PAD a/sp multiple interventions including a left femorofemoral crossover grafting in the 1990s with multiple subsequent stenting of his right lower extremity over since then. More recently, he underwent right iliofemoral endarterectomy in 07/2016 by Dr. Trula Slade and then stenting of a high grade right external iliac artery stenosis in 09/2016 with marked improvement in claudication symptoms. Most recent doppler studies in 12/2020 showed patent right to left femorofemoral crossover graft and patent right iliac stent.  Patient was last seen by Dr. Gwenlyn Found on 12/29/2020 at which time he was doing well. Since last visit, he  was recently admitted on from 04/01/2021 to 04/06/2021 for a left hip fracture and underwent left hip intramedullary nail placement on 04/03/2021. Patient's home Amlodipine and HCTZ were discontinued due to low BP in the 70s/40s while working with PT/OT. Home Lopressor and Losartan were continued. Plavix was also discontinued and patient was advised to follow-up with  Dr. Gwenlyn Found to see if he needed to be on Aspirin, Plavix, and Pletal.  Patient presents today for follow-up. ***  CAD - S/p prior stenting to LCX and then CABG x5  in 10/2015. - No angina. - Continue aspirin, beta-blocker, and high-intensity statin.  PAD - S/p multiple intervention described above. Most recent dopplers in 12/2020 showed patent right to left femorofemoral crossover graft and patent right iliac stent. - No claudication.  - Continue Aspirin and Pletal. Patient was also previously on Plavix but this was discontinued on recent discharge. Will confirm with Dr. Gwenlyn Found about whether he should be taking this as well.  Hypertension - BP well controlled. - Continue current medications: Lopressor ***'50mg'$  twice daily and Losartan '50mg'$  daily.  Hyperlipidemia - Lipid panel in 12/2020: Total Cholesterol 141, Triglycerides 141, HDL 32, LDL 84.  - LDL goal <70 given CAD and PAD. - Will increase Lipitor to '80mg'$  daily.  - Will recheck lipids and LFTs in 6-8 weeks.  Type 2 Diabetes Mellitus - Management per PCP.    Past Medical History:  Diagnosis Date   CAD S/P percutaneous coronary angioplasty 1998   PCI TO CX   CKD (chronic kidney disease) stage 3, GFR 30-59 ml/min (HCC)    Diabetes mellitus without complication ([place])    Diverticulitis 08/16/2017   hospitalized with diverticulitis/sepsis   GERD (gastroesophageal reflux disease)    occassionally   Hx of CABG March 2017   x 5   Hypercholesteremia    Hypertension    Neuropathy    Peripheral  vascular disease ([place])    s/p R-L FEM-FEM BYPASS   Pneumonia    Tachycardia    after CABG, pt on medicine for this    Past Surgical History:  Procedure Laterality Date   ABDOMINAL AORTOGRAM Bilateral 09/19/2016   Procedure: iliac;  Surgeon: Serafina Mitchell, MD;  Location: [facility];  Service: Cardiovascular;  Laterality: Bilateral;   CARDIAC CATHETERIZATION  2003   with stent   CARDIAC CATHETERIZATION N/A 10/11/2015    Procedure: Left Heart Cath and Coronary Angiography;  Surgeon: Peter M Martinique, MD;  Location: [facility];  Service: Cardiovascular;  Laterality: N/A;   COLON RESECTION N/A 12/13/2017   Procedure: EXPLORATORY LAPAROTOMY, SIGMOID COLECTOMY WITH COLOSTOMY;  Surgeon: Donnie Mesa, MD;  Location: [facility];  Service: General;  Laterality: N/A;   COLONOSCOPY N/A 09/22/2013   Procedure: COLONOSCOPY;  Surgeon: Daneil Dolin, MD;  [facility];  Service: Endoscopy;  Laterality: N/A;  9:30 AM   COLONOSCOPY N/A 02/19/2018   Procedure: COLONOSCOPY;  Surgeon: Daneil Dolin, MD;  [facility];  Service: Endoscopy;  Laterality: N/A;  2:00pm   COLOSTOMY N/A 12/13/2017   Procedure: COLOSTOMY;  Surgeon: Donnie Mesa, MD;  Location: [facility];  Service: General;  Laterality: N/A;   COLOSTOMY REVERSAL N/A 03/26/2018   Procedure: COLOSTOMY REVERSAL;  Surgeon: Donnie Mesa, MD;  Location: [facility];  Service: General;  Laterality: N/A;   CORONARY ARTERY BYPASS GRAFT N/A 10/18/2015   Procedure: CORONARY ARTERY BYPASS GRAFTING (CABG) x  five, using left internal mammary artery and right leg greater saphenous vein harvested endoscopically;  Surgeon: [name], MD;  Location: [facility];  Service: Open Heart Surgery;  Laterality: N/A;   ENDARTERECTOMY FEMORAL Right 08/09/2016   Procedure: ENDARTERECTOMY FEMORAL WITH VEIN PATCH ANGIOPLASTY;  Surgeon: Serafina Mitchell, MD;  [facility];  Service: Vascular;  Laterality: Right;   ESOPHAGOGASTRODUODENOSCOPY N/A 10/24/2017   Dr. Gala Romney: hiatal hernia   FEMORAL-FEMORAL BYPASS GRAFT Bilateral 08/09/2016   Procedure: REVISION BYPASS GRAFT RIGHT FEMORAL-LEFT FEMORAL ARTERY;  Surgeon: Serafina Mitchell, MD;  Location: [facility];  Service: Vascular;  Laterality: Bilateral;   FEMORAL-POPLITEAL BYPASS GRAFT     INTRAMEDULLARY (IM) NAIL INTERTROCHANTERIC Left 04/03/2021   Procedure: LEFT  HIP INTRAMEDULLARY (IM) NAIL INTERTROCHANTRIC;  Surgeon: Mcarthur Rossetti,  MD;  Location: [facility];  Service: Orthopedics;  Laterality: Left;   PERIPHERAL VASCULAR CATHETERIZATION N/A 05/08/2016   Procedure: Lower Extremity Angiography;  Surgeon: Lorretta Harp, MD;  Location: [facility];  Service: Cardiovascular;  Laterality: N/A;   PERIPHERAL VASCULAR INTERVENTION Right 09/19/2016   Procedure: Peripheral Vascular Intervention;  Surgeon: Serafina Mitchell, MD;  Location: [facility];  Service: Cardiovascular;  Laterality: Right;  ext iliac stent   POLYPECTOMY  02/19/2018   Procedure: POLYPECTOMY;  Surgeon: Daneil Dolin, MD;  [facility];  Service: Endoscopy;;   PROCTOSCOPY  10/24/2017   Procedure: PROCTOSCOPY;  Surgeon: Daneil Dolin, MD;  [facility];  Service: Endoscopy;;   TEE WITHOUT CARDIOVERSION N/A 10/18/2015   Procedure: TRANSESOPHAGEAL ECHOCARDIOGRAM (TEE);  Surgeon: [name], MD;  Location: [facility];  Service: Open Heart Surgery;  Laterality: N/A;    Current Medications: No outpatient medications have been marked as taking for the 04/22/21 encounter (Appointment) with Darreld Mclean, PA-C.     Allergies:   Patient has no active allergies.   Social History   Socioeconomic History  Marital status: Married    Spouse name: Not on file   Number of children: Not on file   Years of education: Not on file   Highest education level: Not on file  Occupational History   Not on file  Tobacco Use   Smoking status: Former    Packs/day: 2.00    Years: 30.00    Pack years: 60.00    Types: Cigarettes    Quit date: 08/14/2000    Years since quitting: 20.6   Smokeless tobacco: Never  Vaping Use   Vaping Use: Never used  Substance and Sexual Activity   Alcohol use: No   Drug use: No   Sexual activity: Not on file  Other Topics Concern   Not on file  Social History Narrative   Not on file   Social Determinants of Health   Financial Resource Strain: Not on file  Food Insecurity: Not on file  Transportation  Needs: Not on file  Physical Activity: Not on file  Stress: Not on file  Social Connections: Not on file     Family History: The patient's family history includes Colon cancer in his brother; Heart attack in his mother; Stroke in his mother.  ROS:   Please see the history of present illness.     EKGs/Labs/Other Studies Reviewed:    The following studies were reviewed today:  Left Cardiac Catheterization 09/30/2020 Prox RCA lesion, 90% stenosed. Prox RCA to Mid RCA lesion, 95% stenosed. Prox Cx to Mid Cx lesion, 80% stenosed. Ost LAD to Prox LAD lesion, 85% stenosed. Mid LAD lesion, 85% stenosed. 1st Diag lesion, 70% stenosed.   1. Severe 3 vessel obstructive CAD  Plan: CT surgery consult for CABG. Hold Plavix _______________  Echocardiogram 03/27/2018: Study Conclusions: - Left ventricle: The cavity size was normal. There was mild focal basal hypertrophy of the septum. Systolic function was normal. The estimated ejection fraction was in the range of 55% to 60%. Wall motion was normal; there were no regional wall motion abnormalities. Left ventricular diastolic function parameters were normal.  - Right ventricle: The RV is not well visualized but appears dilated with reduced RVF.  - Pulmonary arteries: Systolic pressure could not be accurately estimated.  _______________  Lower Extremity Dopplers/ABIs 12/21/2020: Summary:  - Right: Atherosclerosis in the femoral and popliteal arteries. Patent right femoral to left femoral bypass graft with no stenosis. The SFA is occluded with reconstitution at the popliteal artery.  - Left: Atherosclerosis in the femoral and popliteal arteries. The SFA is occluded with reconstitution at the popliteal artery. 75-99% stenosis in the tibioperoneal trunk.  - IVC/Iliac: There is no evidence of thrombus involving the IVC.   ABI Summary: Bilateral ABIs and TBIs appear essentially unchanged compared to prior  study on 12/2019.     - Right: Resting  right ankle-brachial index indicates moderate right lower extremity arterial disease. The right toe-brachial index is abnormal.  - Left: Resting left ankle-brachial index indicates noncompressible left lower extremity arteries. The left toe-brachial index is abnormal.   EKG:  EKG not ordered today.   Recent Labs: 12/29/2020: ALT 29 04/05/2021: Magnesium 1.8 04/06/2021: BUN 36; Creatinine, Ser 1.46; Hemoglobin 10.9; Platelets 310; Potassium 3.2; Sodium 135  Recent Lipid Panel    Component Value Date/Time   CHOL 141 12/29/2020 1017   TRIG 141 12/29/2020 1017   HDL 32 (L) 12/29/2020 1017   CHOLHDL 4.4 12/29/2020 1017   CHOLHDL 4.1 10/11/2015 0536   VLDL 27 10/11/2015 0536   LDLCALC  84 12/29/2020 1017    Physical Exam:    Vital Signs: There were no vitals taken for this visit.    Wt Readings from Last 3 Encounters:  04/01/21 142 lb (64.4 kg)  02/08/21 140 lb (63.5 kg)  12/29/20 140 lb 9.6 oz (63.8 kg)     General: 75 y.o. male in no acute distress. HEENT: Normocephalic and atraumatic. Sclera clear. EOMs intact. Neck: Supple. No carotid bruits. No JVD. Heart: *** RRR. Distinct S1 and S2. No murmurs, gallops, or rubs. Radial and distal pedal pulses 2+ and equal bilaterally. Lungs: No increased work of breathing. Clear to ausculation bilaterally. No wheezes, rhonchi, or rales.  Abdomen: Soft, non-distended, and non-tender to palpation. Bowel sounds present in all 4 quadrants.  MSK: Normal strength and tone for age. *** Extremities: No lower extremity edema.    Skin: Warm and dry. Neuro: Alert and oriented x3. No focal deficits. Psych: Normal affect. Responds appropriately.   Assessment:    No diagnosis found.  Plan:     Disposition: Follow up in ***   Medication Adjustments/Labs and Tests Ordered: Current medicines are reviewed at length with the patient today.  Concerns regarding medicines are outlined above.  No orders of the defined types were placed in this  encounter.  No orders of the defined types were placed in this encounter.   There are no Patient Instructions on file for this visit.   Signed, Darreld Mclean, PA-C  04/16/2021 7:07 PM    [facility]

## 2021-04-20 ENCOUNTER — Other Ambulatory Visit: Payer: Self-pay | Admitting: Orthopaedic Surgery

## 2021-04-20 ENCOUNTER — Ambulatory Visit (INDEPENDENT_AMBULATORY_CARE_PROVIDER_SITE_OTHER): Payer: Medicare HMO | Admitting: Orthopaedic Surgery

## 2021-04-20 ENCOUNTER — Encounter: Payer: Self-pay | Admitting: Orthopaedic Surgery

## 2021-04-20 ENCOUNTER — Ambulatory Visit (INDEPENDENT_AMBULATORY_CARE_PROVIDER_SITE_OTHER): Payer: No Typology Code available for payment source

## 2021-04-20 DIAGNOSIS — S72145D Nondisplaced intertrochanteric fracture of left femur, subsequent encounter for closed fracture with routine healing: Secondary | ICD-10-CM

## 2021-04-20 NOTE — Progress Notes (Signed)
The patient is a 75 year old gentleman who is just over 2 weeks out from open reduction/internal fixation of a displaced left hip intertrochanteric fracture.  This occurred after a mechanical fall.  He was taken to the operating room on 8/21 and had an intramedullary rod and hip screws placed to stabilize the left hip fracture.  He is ambulating with a walker.  He has had no appetite and is a thin individual.  We talked about trying to increase his caloric and protein intake.  He may even need to try some Ensure or boost shakes in between meals.  He does tolerate removing his left hip around with flexion and extension.  There is appropriate pain with rotation.  His incisions build nicely so the staples have been removed and Steri-Strips applied.  An AP pelvis and lateral of the left hip shows a well-seated implant with a fracture reduced anatomically.  He will continue attempts to weight-bear as tolerated and working through hopefully home health therapy.  There has been some delay in the home therapy given that he is a patient of the [place] system and they are needing more approval for this apparently.  From my standpoint, I will see him back in 4 weeks with a repeat AP and lateral of the left hip only.  I do not need to see the whole pelvis and do not need to see the knee.

## 2021-04-22 ENCOUNTER — Ambulatory Visit: Payer: Medicare HMO | Admitting: Student

## 2021-04-23 DIAGNOSIS — M25421 Effusion, right elbow: Secondary | ICD-10-CM | POA: Insufficient documentation

## 2021-05-18 ENCOUNTER — Other Ambulatory Visit: Payer: Self-pay

## 2021-05-18 ENCOUNTER — Ambulatory Visit: Payer: Self-pay

## 2021-05-18 ENCOUNTER — Ambulatory Visit (INDEPENDENT_AMBULATORY_CARE_PROVIDER_SITE_OTHER): Payer: Medicare HMO | Admitting: Orthopaedic Surgery

## 2021-05-18 ENCOUNTER — Encounter: Payer: Self-pay | Admitting: Orthopaedic Surgery

## 2021-05-18 DIAGNOSIS — S72145D Nondisplaced intertrochanteric fracture of left femur, subsequent encounter for closed fracture with routine healing: Secondary | ICD-10-CM

## 2021-05-18 NOTE — Progress Notes (Signed)
The patient is now 6-week status post open reduction/internal fixation of a left intertrochanteric hip fracture.  He reports increased strength and range of motion with minimal pain.  He is been trying to transition from a walker to a cane.  He is 75 years old.  On exam I can easily put his left hip and still internal and external rotation as well as compression without any pain at all.  An AP pelvis and lateral left hip shows the fracture is almost completely healed.  There is no complicating features and the alignment is anatomic.  At this point he will still increase his activities as he tolerates.  We will transition to a cane appropriately per therapy.  I will see him back for final visit in 4 weeks.  At that visit I would like just an AP and lateral of his left hip.  We do not need to see the full pelvis.

## 2021-05-19 ENCOUNTER — Ambulatory Visit: Payer: No Typology Code available for payment source | Admitting: Orthopaedic Surgery

## 2021-06-03 ENCOUNTER — Telehealth: Payer: Self-pay | Admitting: Orthopaedic Surgery

## 2021-06-03 NOTE — Telephone Encounter (Signed)
Pt called wondering if ou got the MRI results  CB [phone]

## 2021-06-03 NOTE — Telephone Encounter (Signed)
Told patient we have not received anything just yet to call back and check next week

## 2021-06-07 ENCOUNTER — Telehealth: Payer: Self-pay

## 2021-06-07 NOTE — Telephone Encounter (Signed)
Patient contacted the office and stated that he had a scan done at [facility] but was unsure what kind of scan it was. He states that he has had so many within the last couple months. Advised that if it is within three months of his follow-up appointment, he would not have to have a repeat study. He stated that he would find out what kind of scan it was and give the office a call back. Advised that it had to be a CT of his chest and he acknowledged receipt. Patient states that if it is a chest CT he would have CD of imaging and impression sent to [facility] or bring it to his follow-up in December.

## 2021-06-10 ENCOUNTER — Telehealth: Payer: Self-pay | Admitting: Thoracic Surgery (Cardiothoracic Vascular Surgery)

## 2021-06-10 NOTE — Telephone Encounter (Signed)
      [address]       [place] [address]             [phone]      [name] came by the office earlier this week and dropped off an MRI of the spine that had been done last week.  I saw him in the office back in June.  He had a CT to follow-up some lung nodules.  There is nothing suspicious from a lung nodule standpoint but the radiologist mentioned a lesion on the T9 vertebral body.  I recommended he see a spine surgeon and have an MRI.  Put an order in for the MRI but apparently it was only done 4 months later.  He says he saw a spine surgeon here in [place] not long after that and was told there was anything to do about it right now..  I cannot find any record of that in Epic.  He cannot remember the spine surgeon's name.   The MRI report says there is a possibility this could be malignancy.  I recommended that he look through his records and bills to determine who the spine surgeon was and to contact his office regarding the MRI results.  If he is unable to do so he can call us and I will make another referral.  I made it clear that this should be evaluated in the near future.  Revonda Standard Roxan Hockey, MD [facility] [phone]

## 2021-06-15 ENCOUNTER — Encounter: Payer: Self-pay | Admitting: Orthopaedic Surgery

## 2021-06-15 ENCOUNTER — Telehealth: Payer: Self-pay | Admitting: Cardiovascular Disease

## 2021-06-15 ENCOUNTER — Ambulatory Visit: Payer: Self-pay

## 2021-06-15 ENCOUNTER — Ambulatory Visit (INDEPENDENT_AMBULATORY_CARE_PROVIDER_SITE_OTHER): Payer: Medicare HMO | Admitting: Orthopaedic Surgery

## 2021-06-15 DIAGNOSIS — S72145D Nondisplaced intertrochanteric fracture of left femur, subsequent encounter for closed fracture with routine healing: Secondary | ICD-10-CM | POA: Diagnosis not present

## 2021-06-15 NOTE — Progress Notes (Signed)
The patient is between 9 and 10 weeks status post open reduction/central fixation of a left intertrochanteric hip fracture.  He is 75 years old and ambulate with a cane.  He says he is doing well and does have pain on the side of his hip when he lays on that side.  He says the sore when sitting and gets up but it is not terrible for him.  I can easily put his left hip through internal ex rotation with no pain in the groin at all.  He is very thin individual so he does have pain over the entry site for the screws which is common to get some bursitis in this area.  2 views of the left hip show the fracture is healed completely of his intertrochanteric fracture and the hardware is intact.  From my standpoint he can follow-up as needed.  If he continues to have problems with bursitis or pain or lateral aspect of the hip we can always provide a steroid injection there if needed.  All questions and concerns were answered and addressed.  He did let me know he has to have some type of procedure for improving blood flow to his legs.  I am fine with him proceeding without procedure from my standpoint.

## 2021-06-15 NOTE — Telephone Encounter (Signed)
LM2CB [name] Drema [place] (daughter). APPT SCHEDULED 12-6 at 215pm. Arrive at 2pm

## 2021-06-15 NOTE — Telephone Encounter (Signed)
Pt's daughter said the pt was seen by a provider at the [facility] recently, pt was advised to contact Dr. Gwenlyn Found to get surgery on his legs again, pt's daughter [name] states that Dr. Gwenlyn Found has done this procedure before. Please contact [name] (DPR) for any other information [phone]

## 2021-06-16 ENCOUNTER — Other Ambulatory Visit (HOSPITAL_COMMUNITY): Payer: Self-pay | Admitting: Family Medicine

## 2021-06-16 DIAGNOSIS — M810 Age-related osteoporosis without current pathological fracture: Secondary | ICD-10-CM

## 2021-06-17 NOTE — Telephone Encounter (Signed)
Left message for patient's daughter to return the call if she would like. Appointment information left on the machine.

## 2021-06-17 NOTE — Telephone Encounter (Signed)
FYI--Patient returned call to confirm that this appointment date/time works for him. He states he will be there.

## 2021-07-05 ENCOUNTER — Ambulatory Visit: Payer: Medicare HMO | Admitting: Thoracic Surgery (Cardiothoracic Vascular Surgery)

## 2021-07-12 ENCOUNTER — Other Ambulatory Visit: Payer: Self-pay

## 2021-07-12 ENCOUNTER — Other Ambulatory Visit: Payer: Self-pay | Admitting: Thoracic Surgery (Cardiothoracic Vascular Surgery)

## 2021-07-12 ENCOUNTER — Ambulatory Visit: Payer: Medicare HMO | Admitting: Thoracic Surgery (Cardiothoracic Vascular Surgery)

## 2021-07-12 VITALS — BP 121/64 | HR 69 | Resp 20 | Ht 67.0 in | Wt 134.0 lb

## 2021-07-12 DIAGNOSIS — R911 Solitary pulmonary nodule: Secondary | ICD-10-CM

## 2021-07-12 DIAGNOSIS — M899 Disorder of bone, unspecified: Secondary | ICD-10-CM | POA: Diagnosis not present

## 2021-07-12 NOTE — Progress Notes (Signed)
[address]       [place] [address]             [phone]      HPI: [name] returns for scheduled follow-up visit regarding a lung nodule  [name] is a 75 year old man with history of tobacco abuse, lung nodules, atherosclerotic cardiovascular disease, CABG, and stage III chronic kidney disease.  He had coronary bypass grafting in 2017.  On his preoperative chest x-ray there was a cavitary lung nodule.  A follow-up CT that had improved but he has multiple small nodules and groundglass opacities bilaterally.  I last saw him in June 2022.  There was improvement of the left upper lobe nodule seen on the previous scan but there was a new groundglass density in the right upper lobe.  And now returns for a follow-up.  Also noted on that CT was a partially sclerotic lesion in the T9 vertebral body.  He saw Dr. Reatha Armour of neurosurgery.  A repeat MRI in October showed no change.  He says he never got word that he was supposed to have a CT at this visit.  He denies chest pain or shortness of breath.  He is currently having evaluation done for peripheral vascular disease.  Past Medical History:  Diagnosis Date   CAD S/P percutaneous coronary angioplasty 1998   PCI TO CX   CKD (chronic kidney disease) stage 3, GFR 30-59 ml/min (HCC)    Diabetes mellitus without complication ([place])    Diverticulitis 08/16/2017   hospitalized with diverticulitis/sepsis   GERD (gastroesophageal reflux disease)    occassionally   Hx of CABG March 2017   x 5   Hypercholesteremia    Hypertension    Neuropathy    Peripheral vascular disease (HCC)    s/p R-L FEM-FEM BYPASS   Pneumonia    Tachycardia    after CABG, pt on medicine for this     Current Outpatient Medications  Medication Sig Dispense Refill   aspirin EC 81 MG tablet Take 1 tablet (81 mg total) by mouth daily with breakfast. (Patient taking differently: Take 81 mg by mouth at bedtime.) 30 tablet 2   atorvastatin  (LIPITOR) 40 MG tablet Take 1 tablet (40 mg total) by mouth daily at 6 PM. (Patient taking differently: Take 40 mg by mouth at bedtime.) 30 tablet 1   cilostazol (PLETAL) 100 MG tablet Take 100 mg by mouth 2 (two) times daily.     empagliflozin (JARDIANCE) 25 MG TABS tablet Take 12.5 mg by mouth daily.      ferrous sulfate 325 (65 FE) MG tablet Take 325 mg by mouth 3 (three) times daily.      gabapentin (NEURONTIN) 600 MG tablet Take 600-1,200 mg by mouth See admin instructions. Take one tablet (600 mg) by mouth at morning and noon, take two tablets (1200 mg) at bedtime     insulin glargine (LANTUS) 100 UNIT/ML injection Inject 20 Units into the skin at bedtime.     losartan (COZAAR) 50 MG tablet Take 50 mg by mouth daily.     metFORMIN (GLUCOPHAGE) 1000 MG tablet Take 1,000 mg by mouth 2 (two) times daily with a meal.      metoprolol tartrate (LOPRESSOR) 50 MG tablet Take 25 mg by mouth 2 (two) times daily.      pantoprazole (PROTONIX) 40 MG tablet Take 1 tablet (40 mg total) by mouth daily. 5 tablet 0   Semaglutide,0.25 or 0.5MG /DOS, ([place],  0.25 OR 0.5 MG/DOSE,) 2 MG/1.5ML SOPN Inject 0.25 mg into the skin every Sunday. Sunday night     vitamin B-12 (CYANOCOBALAMIN) 1000 MCG tablet Take 1,000 mcg by mouth daily.     No current facility-administered medications for this visit.    Physical Exam BP 121/64   Pulse 69   Resp 20   Ht 5\' 7"  (1.702 m)   Wt 134 lb (60.8 kg)   SpO2 97% Comment: RA  BMI 20.33 kg/m  75 year old man in no acute distress Alert and oriented x3 with no focal deficits Cardiac regular rate and rhythm Lungs slightly diminished but equal bilaterally  Diagnostic Tests: None  Impression: [name] is a 75 year old man ith history of tobacco abuse, lung nodules, atherosclerotic cardiovascular disease, CABG, and stage III chronic kidney disease.    Status post CABG 2017-no anginal symptoms.  On aspirin, Lipitor, beta-blocker, and ARB.  Followed by Dr. Gwenlyn Found  Lung  nodules-was supposed to have follow-up CT at this visit.  Unfortunately that did not happen.  We will schedule [facility] to be done at [facility] and then I can call him on the phone with the result.  Sclerotic lesion thoracic spine-being followed by Dr. Reatha Armour  PAD-followed by Dr. Gwenlyn Found  Plan: Will have the patient obtain a CT at [facility] and then do a follow-up telephone visit next week.  [name], MD [facility] [phone]

## 2021-07-13 ENCOUNTER — Encounter: Payer: Self-pay | Admitting: Thoracic Surgery (Cardiothoracic Vascular Surgery)

## 2021-07-19 ENCOUNTER — Other Ambulatory Visit: Payer: Self-pay

## 2021-07-19 ENCOUNTER — Ambulatory Visit (INDEPENDENT_AMBULATORY_CARE_PROVIDER_SITE_OTHER): Payer: Medicare HMO | Admitting: Cardiovascular Disease

## 2021-07-19 ENCOUNTER — Encounter: Payer: Self-pay | Admitting: Cardiovascular Disease

## 2021-07-19 DIAGNOSIS — I739 Peripheral vascular disease, unspecified: Secondary | ICD-10-CM

## 2021-07-19 DIAGNOSIS — E782 Mixed hyperlipidemia: Secondary | ICD-10-CM | POA: Diagnosis not present

## 2021-07-19 DIAGNOSIS — I10 Essential (primary) hypertension: Secondary | ICD-10-CM

## 2021-07-19 DIAGNOSIS — Z951 Presence of aortocoronary bypass graft: Secondary | ICD-10-CM | POA: Diagnosis not present

## 2021-07-19 NOTE — Assessment & Plan Note (Signed)
History of hyperlipidemia on statin therapy with lipid profile performed 06/10/2021 revealing a total cholesterol of 108, LDL of 83 and HDL 30.

## 2021-07-19 NOTE — Patient Instructions (Signed)
Medication Instructions:  Your physician recommends that you continue on your current medications as directed. Please refer to the Current Medication list given to you today.  *If you need a refill on your cardiac medications before your next appointment, please call your pharmacy*   Testing/Procedures: Your physician has requested that you have a carotid duplex. This test is an ultrasound of the carotid arteries in your neck. It looks at blood flow through these arteries that supply the brain with blood. Allow one hour for this exam. There are no restrictions or special instructions. This procedure is done at [address]    Follow-Up: At [facility], you and your health needs are our priority.  As part of our continuing mission to provide you with exceptional heart care, we have created designated Provider Care Teams.  These Care Teams include your primary Cardiologist (physician) and Advanced Practice Providers (APPs -  Physician Assistants and Nurse Practitioners) who all work together to provide you with the care you need, when you need it.  We recommend signing up for the patient portal called "MyChart".  Sign up information is provided on this After Visit Summary.  MyChart is used to connect with patients for Virtual Visits (Telemedicine).  Patients are able to view lab/test results, encounter notes, upcoming appointments, etc.  Non-urgent messages can be sent to your provider as well.   To learn more about what you can do with MyChart, go to [email or web address].    Your next appointment:   12 month(s)  The format for your next appointment:   In Person  Provider:   Jonathan Berry, MD 

## 2021-07-19 NOTE — Assessment & Plan Note (Signed)
History of PAD status post left to right femorofemoral bypass grafting by Dr. Amedeo Plenty back in the 90s.  I last angiogram him 05/08/2016.  He has had stenting of his right iliac artery.  He has known occluded SFAs bilaterally.  His left right femorofemoral crossover graft was patent although he had a 95% right common femoral artery stenosis.  He ultimately underwent iliofemoral endarterectomy by Dr. Trula Slade 08/09/2016 and was brought back 09/19/2016 for right iliac stenting.  He currently denies claudication.  Dopplers performed 12/21/2020 revealed a patent right iliac stent and bypass graft.

## 2021-07-19 NOTE — Assessment & Plan Note (Signed)
History of essential hypertension a blood pressure measured today 146/60.  He is on losartan and metoprolol.

## 2021-07-19 NOTE — Progress Notes (Signed)
07/19/2021 [name]   1946-08-11  [id number]  Primary Physician [name], MD Primary Cardiologist: Lorretta Harp MD FACP, [place], [place], [place]  HPI:  [name] is a 75 y.o.  married Caucasian male father of 2 children who I took care of for years and last saw in the office 12/29/2020.  He has a history of CAD status post circumflex stenting by myself in 2003. He also has a history of peripheral vascular disease status post grafting of his lower extremities by Dr. Amedeo Plenty in the 90s. He had right to left femorofemoral crossover grafting. I performed stenting of his right lower extremity multiple times. He stopped smoking remotely. His cardiovascular risk factor profile includes treated hypertension, hyperlipidemia and type 2 diabetes. He was admitted in February with unstable angina and underwent cardiac catheterization by Dr. Martinique 10/11/15 revealing three-vessel disease. He ultimately underwent coronary artery bypass grafting on 10/18/15 by Dr. Roxan Hockey is a LIMA to his LAD, vein to diagonal branch, obtuse marginal branches 1 and 2 and the distal RCA. His postop course was unremarkable. He recently had Doppler studies revealing an occluded left iliac with high-grade right external iliac artery stenosis, occluded SFAs bilaterally with occluded left femoropopliteal bypass graft and a patent right to left femorofemoral crossover graft.. His right ABI was 0.6 by the left was 0.88. He was more symptomatic on the right and does have lifestyle limiting claudication. He underwent peripheral angiography by myself 05/08/16 revealing a patent right to left femorofemoral crossover graft, patent right iliac stent, occluded SFAs bilaterally with a high-grade 95% calcified right common femoral stenosis just proximal to the femorofemoral anastomosis. He also underwent right iliofemoral endarterectomy by Dr. Trula Slade 08/09/16 however his postprocedure Dopplers actually showed worsening of his claudication is  worse as well. He underwent peripheral angiography by Dr. Trula Slade 09/19/16 revealing a high-grade right external iliac artery stenosis which she ultimately stented. This resulted in marked improvement in his claudication symptoms.   He was readmitted in June with pneumonia and palpitations. He did see Almyra Deforest PA-C in the office cause of this and he was somewhat hypotensive. He was readmitted to the hospital because of sepsis and this was appropriately treated. He saw Almyra Deforest PA-C in the office 01/31/17 after being treated for sepsis as blood pressure had improved and his palpitations had resolved.   Since I saw him in the office 6 months ago he remained stable.  He denies chest pain or shortness of breath.  He does have stable bilateral calf claudication.  His recent Doppler studies performed 12/21/2020 revealed a patent right to left femorofemoral crossover graft and patent right iliac stent.   Current Meds  Medication Sig   aspirin EC 81 MG tablet Take 1 tablet (81 mg total) by mouth daily with breakfast. (Patient taking differently: Take 81 mg by mouth at bedtime.)   atorvastatin (LIPITOR) 40 MG tablet Take 1 tablet (40 mg total) by mouth daily at 6 PM. (Patient taking differently: Take 40 mg by mouth at bedtime.)   cilostazol (PLETAL) 100 MG tablet Take 100 mg by mouth 2 (two) times daily.   empagliflozin (JARDIANCE) 25 MG TABS tablet Take 12.5 mg by mouth daily.    ferrous sulfate 325 (65 FE) MG tablet Take 325 mg by mouth 3 (three) times daily.    gabapentin (NEURONTIN) 600 MG tablet Take 600-1,200 mg by mouth See admin instructions. Take one tablet (600 mg) by mouth at morning and noon, take two tablets (  1200 mg) at bedtime   insulin glargine (LANTUS) 100 UNIT/ML injection Inject 20 Units into the skin at bedtime.   losartan (COZAAR) 50 MG tablet Take 50 mg by mouth daily.   metFORMIN (GLUCOPHAGE) 1000 MG tablet Take 1,000 mg by mouth 2 (two) times daily with a meal.    metoprolol tartrate  (LOPRESSOR) 50 MG tablet Take 25 mg by mouth 2 (two) times daily.    pantoprazole (PROTONIX) 40 MG tablet Take 1 tablet (40 mg total) by mouth daily.   Semaglutide,0.25 or 0.5MG /DOS, (OZEMPIC, 0.25 OR 0.5 MG/DOSE,) 2 MG/1.5ML SOPN Inject 0.25 mg into the skin every Sunday. Sunday night   vitamin B-12 (CYANOCOBALAMIN) 1000 MCG tablet Take 1,000 mcg by mouth daily.     No Known Allergies  Social History   Socioeconomic History   Marital status: Married    Spouse name: Not on file   Number of children: Not on file   Years of education: Not on file   Highest education level: Not on file  Occupational History   Not on file  Tobacco Use   Smoking status: Former    Packs/day: 2.00    Years: 30.00    Pack years: 60.00    Types: Cigarettes    Quit date: 08/14/2000    Years since quitting: 20.9   Smokeless tobacco: Never  Vaping Use   Vaping Use: Never used  Substance and Sexual Activity   Alcohol use: No   Drug use: No   Sexual activity: Not on file  Other Topics Concern   Not on file  Social History Narrative   Not on file   Social Determinants of Health   Financial Resource Strain: Not on file  Food Insecurity: Not on file  Transportation Needs: Not on file  Physical Activity: Not on file  Stress: Not on file  Social Connections: Not on file  Intimate Partner Violence: Not on file     Review of Systems: General: negative for chills, fever, night sweats or weight changes.  Cardiovascular: negative for chest pain, dyspnea on exertion, edema, orthopnea, palpitations, paroxysmal nocturnal dyspnea or shortness of breath Dermatological: negative for rash Respiratory: negative for cough or wheezing Urologic: negative for hematuria Abdominal: negative for nausea, vomiting, diarrhea, bright red blood per rectum, melena, or hematemesis Neurologic: negative for visual changes, syncope, or dizziness All other systems reviewed and are otherwise negative except as noted  above.    Blood pressure (!) 146/60, pulse 69, height 5\' 7"  (1.702 m), weight 145 lb 9.6 oz (66 kg), SpO2 95 %.  General appearance: alert and no distress Neck: no adenopathy, no JVD, supple, symmetrical, trachea midline, thyroid not enlarged, symmetric, no tenderness/mass/nodules, and soft right carotid Amos Micheals Lungs: clear to auscultation bilaterally Heart: regular rate and rhythm, S1, S2 normal, no murmur, click, rub or gallop Extremities: extremities normal, atraumatic, no cyanosis or edema Pulses: Absent pedal pulses Skin: Skin color, texture, turgor normal. No rashes or lesions Neurologic: Grossly normal  EKG sinus rhythm at 69 without ST or T wave changes.  Personally reviewed this EKG.  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  Dr. Martinique did heart catheterization on him 10/11/2015 revealing three-vessel disease.  Ultimately he had CABG by Dr. Roxan Hockey 10/18/2015 with a LIMA to his LAD, vein to diagonal branch, marginal branches 1 and 2 and distal RCA.  He has done well since.  He denies chest  pain or shortness of breath.  PVD (peripheral vascular disease) (HCC) History of PAD status post left to right femorofemoral bypass grafting by Dr. Amedeo Plenty back in the 90s.  I last angiogram him 05/08/2016.  He has had stenting of his right iliac artery.  He has known occluded SFAs bilaterally.  His left right femorofemoral crossover graft was patent although he had a 95% right common femoral artery stenosis.  He ultimately underwent iliofemoral endarterectomy by Dr. Trula Slade 08/09/2016 and was brought back 09/19/2016 for right iliac stenting.  He currently denies claudication.  Dopplers performed 12/21/2020 revealed a patent right iliac stent and bypass graft.  Essential hypertension History of essential hypertension a blood pressure measured today 146/60.  He is on losartan and metoprolol.  HLD (hyperlipidemia) History of  hyperlipidemia on statin therapy with lipid profile performed 06/10/2021 revealing a total cholesterol of 108, LDL of 83 and HDL 30.     Lorretta Harp MD FACP,FACC,FAHA, [facility] 07/19/2021 2:31 PM

## 2021-07-19 NOTE — Assessment & Plan Note (Signed)
History of CAD status post stenting by myself back in 2003.  Dr. Martinique did heart catheterization on him 10/11/2015 revealing three-vessel disease.  Ultimately he had CABG by Dr. Roxan Hockey 10/18/2015 with a LIMA to his LAD, vein to diagonal branch, marginal branches 1 and 2 and distal RCA.  He has done well since.  He denies chest pain or shortness of breath.

## 2021-07-21 ENCOUNTER — Ambulatory Visit (HOSPITAL_COMMUNITY): Payer: No Typology Code available for payment source

## 2021-07-22 ENCOUNTER — Ambulatory Visit (HOSPITAL_COMMUNITY)
Admission: RE | Admit: 2021-07-22 | Discharge: 2021-07-22 | Disposition: A | Discharge status: Home / Self Care | Payer: No Typology Code available for payment source | Source: Ambulatory Visit | Attending: Thoracic Surgery (Cardiothoracic Vascular Surgery) | Admitting: Thoracic Surgery (Cardiothoracic Vascular Surgery)

## 2021-07-22 ENCOUNTER — Other Ambulatory Visit: Payer: Self-pay

## 2021-07-22 DIAGNOSIS — R911 Solitary pulmonary nodule: Secondary | ICD-10-CM | POA: Diagnosis present

## 2021-07-25 ENCOUNTER — Ambulatory Visit: Payer: Medicare HMO | Admitting: Orthopaedic Surgery

## 2021-07-26 ENCOUNTER — Ambulatory Visit (INDEPENDENT_AMBULATORY_CARE_PROVIDER_SITE_OTHER): Payer: No Typology Code available for payment source | Admitting: Thoracic Surgery (Cardiothoracic Vascular Surgery)

## 2021-07-26 ENCOUNTER — Other Ambulatory Visit: Payer: Self-pay

## 2021-07-26 DIAGNOSIS — R911 Solitary pulmonary nodule: Secondary | ICD-10-CM

## 2021-07-26 NOTE — Progress Notes (Signed)
[address]       [place] [address]             [phone]      This visit was conducted by telephone in follow-up to the patient's in person office visit from 2 weeks ago.  This was done at the patient's request as he is out of town.  Patient location: Home MD location: Office  Harriet Bollen is a 75 year old man with a history of tobacco abuse, lung nodules, ASCVD, CABG, and stage III chronic kidney disease.  He had a CABG in 2017 and was found to have a cavitary lung nodule on preoperative chest x-ray.  A follow-up CT showed multiple small nodules and groundglass opacities bilaterally.  When I saw him in June there was a new groundglass density in the right upper lobe.  He was supposed to see me back with a chest CT at 6 months but came to the office 2 weeks ago without having had the scan done.  He continues to feel well with no chest pain or shortness of breath.    CT CHEST WITHOUT CONTRAST   TECHNIQUE: Multidetector CT imaging of the chest was performed following the standard protocol without IV contrast.   COMPARISON:  Chest CT 01/12/2021.   FINDINGS: Cardiovascular: Heart size is normal. There is no significant pericardial fluid, thickening or pericardial calcification. There is aortic atherosclerosis, as well as atherosclerosis of the great vessels of the mediastinum and the coronary arteries, including calcified atherosclerotic plaque in the left main, left anterior descending, left circumflex and right coronary arteries. Status post median sternotomy for CABG including LIMA to the LAD. Mild calcifications of the aortic valve.   Mediastinum/Nodes: No pathologically enlarged mediastinal or hilar lymph nodes. Please note that accurate exclusion of hilar adenopathy is limited on noncontrast CT scans. Esophagus is unremarkable in appearance. No axillary lymphadenopathy.   Lungs/Pleura: Study is limited by considerable patient respiratory motion.  With these limitations in mind, the patchy multifocal peribronchovascular airspace consolidation and ground-glass attenuation seen on the prior examination has regressed, indicative of an infectious etiology on the prior study. There continues to be some scattered areas of thickening of the peribronchovascular interstitium with some mild peribronchovascular ground-glass attenuation and regional areas of architectural distortion scattered throughout the lungs bilaterally, most evident in the upper lobes, particularly in the posterior aspect of the left upper lobe where the densest area of consolidation was noted on the most recent prior study. No suspicious appearing pulmonary nodules or masses are otherwise noted. No pleural effusions.   Upper Abdomen: Aortic atherosclerosis. Numerous nonobstructive calculi are noted within the upper pole collecting system of left kidney measuring up to 5 mm. Amorphous intermediate attenuation material lying dependently in the gallbladder may reflect biliary sludge and/or noncalcified gallstones. Incompletely imaged exophytic low-attenuation lesion in the upper pole of the right kidney measuring at least 2.6 cm in diameter is not characterized on today's non-contrast CT examination, but statistically likely to represent a cyst.   Musculoskeletal: Median sternotomy wires. Minimally displaced nonacute fracture of the lateral aspect of the right tenth rib, new compared to the prior examination. There are no aggressive appearing lytic or blastic lesions noted in the visualized portions of the skeleton.   IMPRESSION: 1. 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 Further clinical evaluation is recommended. No suspicious  appearing pulmonary nodules or masses are noted on today's  examination. 2. New but nonacute minimally displaced fracture of the lateral aspect of the right tenth rib. No pneumothorax. 3. Aortic atherosclerosis, in addition to left main and three-vessel coronary artery disease. Status post median sternotomy for CABG including LIMA to the LAD. 4. There are calcifications of the aortic valve. Echocardiographic correlation for evaluation of potential valvular dysfunction may be warranted if clinically indicated. 5. Nonobstructive calculi in the upper pole collecting system of left kidney measuring up to 5 mm. 6. Cholelithiasis and/or biliary sludge lying dependently in the gallbladder.   Aortic Atherosclerosis (ICD10-I70.0).     Electronically Signed   By: Vinnie Langton M.D.   On: 07/23/2021 07:09 I personally reviewed the CT chest.  No findings suspicious for malignancy.  Improved groundglass opacities in comparison to film from June.  Overall improvement in the appearance of the opacities on his CT scan of the chest.  Possibility of a indolent infection.  We will plan to do another CT in 6 months.  I will think anything more aggressive is warranted currently given his lack of symptoms and improvement of the scan.  I spent 10 minutes in review of records, images, and on the telephone with Mr. Pepperman today.  Revonda Standard Roxan Hockey, MD [facility] [phone]

## 2021-07-27 ENCOUNTER — Other Ambulatory Visit: Payer: Self-pay | Admitting: Cardiovascular Disease

## 2021-07-27 DIAGNOSIS — R0989 Other specified symptoms and signs involving the circulatory and respiratory systems: Secondary | ICD-10-CM

## 2021-08-17 ENCOUNTER — Other Ambulatory Visit: Payer: Self-pay

## 2021-08-17 ENCOUNTER — Encounter: Payer: Self-pay | Admitting: Orthopaedic Surgery

## 2021-08-17 ENCOUNTER — Ambulatory Visit (INDEPENDENT_AMBULATORY_CARE_PROVIDER_SITE_OTHER): Payer: No Typology Code available for payment source | Admitting: Orthopaedic Surgery

## 2021-08-17 DIAGNOSIS — S72145D Nondisplaced intertrochanteric fracture of left femur, subsequent encounter for closed fracture with routine healing: Secondary | ICD-10-CM

## 2021-08-17 NOTE — Progress Notes (Signed)
The patient is a 76 year old veteran who is now almost 5 months status post open reduction/central fixation of a left intertrochanteric hip fracture.  He has a intramedullary nail and hip screw.  He has no problems at all.  He is not walking with any assistive device.  He has no limp.  He has improved range of motion and strength.  At his last visit, I had actually released him to follow-up as needed.  His x-rays showed the hip is healed completely.  For some reason the [place] system wants [place] to see him again.  He has no issues whatsoever and is very pleased.  Examination of his left operative hip shows full and fluid range of motion with no pain at all.  There is no pain compressing the hip joint or stressing his fracture.  He walks with a normal gait.  His leg lengths are equal.  Again he can follow-up as needed.  He has no restrictions as it relates to his hip.  He is very satisfied and pleased as am I.  All questions and concerns were answered and addressed.

## 2021-08-24 ENCOUNTER — Other Ambulatory Visit: Payer: Self-pay

## 2021-08-24 DIAGNOSIS — I714 Abdominal aortic aneurysm, without rupture, unspecified: Secondary | ICD-10-CM

## 2021-08-30 ENCOUNTER — Ambulatory Visit (INDEPENDENT_AMBULATORY_CARE_PROVIDER_SITE_OTHER): Payer: Medicare HMO

## 2021-08-30 DIAGNOSIS — R0989 Other specified symptoms and signs involving the circulatory and respiratory systems: Secondary | ICD-10-CM | POA: Diagnosis not present

## 2021-09-01 ENCOUNTER — Telehealth: Payer: Self-pay | Admitting: Cardiology

## 2021-09-01 NOTE — Telephone Encounter (Signed)
Tired to call pt. Voicemail box is full. Unable to leave voicemail.

## 2021-09-01 NOTE — Telephone Encounter (Signed)
The patient has been notified of the result and verbalized understanding for carotid ultrasound.  All questions (if any) were answered. Beatrix Fetters, RN 09/01/2021 4:28 PM

## 2021-09-01 NOTE — Telephone Encounter (Signed)
Pt came into office wanting results from Carotid Study

## 2021-09-06 ENCOUNTER — Encounter: Payer: Self-pay | Admitting: Vascular Surgery

## 2021-09-06 ENCOUNTER — Ambulatory Visit (HOSPITAL_COMMUNITY)
Admission: RE | Admit: 2021-09-06 | Discharge: 2021-09-06 | Disposition: A | Discharge status: Home / Self Care | Payer: No Typology Code available for payment source | Source: Ambulatory Visit | Attending: Surgery | Admitting: Surgery

## 2021-09-06 ENCOUNTER — Other Ambulatory Visit: Payer: Self-pay | Admitting: Surgery

## 2021-09-06 ENCOUNTER — Other Ambulatory Visit: Payer: Self-pay

## 2021-09-06 ENCOUNTER — Ambulatory Visit (INDEPENDENT_AMBULATORY_CARE_PROVIDER_SITE_OTHER): Payer: No Typology Code available for payment source | Admitting: Vascular Surgery

## 2021-09-06 VITALS — BP 146/76 | HR 70 | Temp 97.9°F | Resp 16 | Ht 68.0 in | Wt 146.0 lb

## 2021-09-06 DIAGNOSIS — I714 Abdominal aortic aneurysm, without rupture, unspecified: Secondary | ICD-10-CM | POA: Diagnosis present

## 2021-09-06 DIAGNOSIS — I739 Peripheral vascular disease, unspecified: Secondary | ICD-10-CM

## 2021-09-06 DIAGNOSIS — I779 Disorder of arteries and arterioles, unspecified: Secondary | ICD-10-CM

## 2021-09-06 NOTE — Progress Notes (Signed)
Patient name: [name] MRN: [id number] DOB: 04-Dec-1945 Sex: male  REASON FOR CONSULT: Referral states evaluate abdominal aortic aneurysm  HPI: [name] is a 76 y.o. male, with history of coronary artery disease, diabetes, hypertension, hyperlipidemia, history of femoral-femoral bypass that presents from the [place] for what the referral states is evaluation of abdominal aortic aneurysm.  Patient states he believes he is here for an aneurysm as well.  He has been followed at the [place] recently.  States he has numbness in both feet at night.  Does not have pain in his feet or legs when he is walking.  No abdominal pain.  In review of the notes, appears he had a CTA aorta iliofemoral runoff on 06/06/2021 at the [place] and the study was ordered for cold feet.  This showed extensive plaque in the major branches with high-grade stenosis of the celiac, SMA, renal arteries and inferior mesenteric artery.  He has extensive calcified plaque in the right common and external iliac arteries with a patent femoral-femoral bypass graft and occluded SFAs bilaterally.  He has a known left common iliac occlusion.  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    In review of our records here, he had a femorofemoral bypass with Dr. Amedeo Plenty years ago.  As recently as 08/10/2016 he had a redo right femoral artery exposure with iliofemoral endarterectomy bovine patch and redo right limb of his femorofemoral bypass by Dr. Trula Slade.  On 09/19/2016 he then had ultrasound-guided access of left brachial artery with angioplasty of the right common femoral artery, stent of the right external iliac, and stent of the right common femoral artery also by Dr. Trula Slade.  Past Medical History:  Diagnosis Date   CAD S/P percutaneous coronary angioplasty 1998   PCI TO CX   CKD (chronic kidney disease) stage 3, GFR 30-59 ml/min (HCC)    Diabetes mellitus without complication ([place])    Diverticulitis 08/16/2017   hospitalized with  diverticulitis/sepsis   GERD (gastroesophageal reflux disease)    occassionally   Hx of CABG March 2017   x 5   Hypercholesteremia    Hypertension    Neuropathy    Peripheral vascular disease ([place])    s/p R-L FEM-FEM BYPASS   Pneumonia    Tachycardia    after CABG, pt on medicine for this    Past Surgical History:  Procedure Laterality Date   ABDOMINAL AORTOGRAM Bilateral 09/19/2016   Procedure: iliac;  Surgeon: Serafina Mitchell, MD;  Location: [facility];  Service: Cardiovascular;  Laterality: Bilateral;   CARDIAC CATHETERIZATION  2003   with stent   CARDIAC CATHETERIZATION N/A 10/11/2015   Procedure: Left Heart Cath and Coronary Angiography;  Surgeon: Peter M Martinique, MD;  Location: [facility];  Service: Cardiovascular;  Laterality: N/A;   COLON RESECTION N/A 12/13/2017   Procedure: EXPLORATORY LAPAROTOMY, SIGMOID COLECTOMY WITH COLOSTOMY;  Surgeon: Donnie Mesa, MD;  Location: [facility];  Service: General;  Laterality: N/A;   COLONOSCOPY N/A 09/22/2013   Procedure: COLONOSCOPY;  Surgeon: Daneil Dolin, MD;  [facility];  Service: Endoscopy;  Laterality: N/A;  9:30 AM   COLONOSCOPY N/A 02/19/2018   Procedure: COLONOSCOPY;  Surgeon: Daneil Dolin, MD;  [facility];  Service: Endoscopy;  Laterality: N/A;  2:00pm   COLOSTOMY N/A 12/13/2017   Procedure: COLOSTOMY;  Surgeon: Donnie Mesa, MD;  Location: [facility];  Service: General;  Laterality: N/A;   COLOSTOMY REVERSAL  N/A 03/26/2018   Procedure: COLOSTOMY REVERSAL;  Surgeon: Donnie Mesa, MD;  Location: [facility];  Service: General;  Laterality: N/A;   CORONARY ARTERY BYPASS GRAFT N/A 10/18/2015   Procedure: CORONARY ARTERY BYPASS GRAFTING (CABG) x  five, using left internal mammary artery and right leg greater saphenous vein harvested endoscopically;  Surgeon: [name], MD;  Location: [facility];  Service: Open Heart Surgery;  Laterality: N/A;   ENDARTERECTOMY FEMORAL Right 08/09/2016   Procedure:  ENDARTERECTOMY FEMORAL WITH VEIN PATCH ANGIOPLASTY;  Surgeon: Serafina Mitchell, MD;  [facility];  Service: Vascular;  Laterality: Right;   ESOPHAGOGASTRODUODENOSCOPY N/A 10/24/2017   Dr. Gala Romney: hiatal hernia   FEMORAL-FEMORAL BYPASS GRAFT Bilateral 08/09/2016   Procedure: REVISION BYPASS GRAFT RIGHT FEMORAL-LEFT FEMORAL ARTERY;  Surgeon: Serafina Mitchell, MD;  Location: [facility];  Service: Vascular;  Laterality: Bilateral;   FEMORAL-POPLITEAL BYPASS GRAFT     INTRAMEDULLARY (IM) NAIL INTERTROCHANTERIC Left 04/03/2021   Procedure: LEFT  HIP INTRAMEDULLARY (IM) NAIL INTERTROCHANTRIC;  Surgeon: Mcarthur Rossetti, MD;  Location: [facility];  Service: Orthopedics;  Laterality: Left;   PERIPHERAL VASCULAR CATHETERIZATION N/A 05/08/2016   Procedure: Lower Extremity Angiography;  Surgeon: Lorretta Harp, MD;  Location: [facility];  Service: Cardiovascular;  Laterality: N/A;   PERIPHERAL VASCULAR INTERVENTION Right 09/19/2016   Procedure: Peripheral Vascular Intervention;  Surgeon: Serafina Mitchell, MD;  Location: [facility];  Service: Cardiovascular;  Laterality: Right;  ext iliac stent   POLYPECTOMY  02/19/2018   Procedure: POLYPECTOMY;  Surgeon: Daneil Dolin, MD;  [facility];  Service: Endoscopy;;   PROCTOSCOPY  10/24/2017   Procedure: PROCTOSCOPY;  Surgeon: Daneil Dolin, MD;  [facility];  Service: Endoscopy;;   TEE WITHOUT CARDIOVERSION N/A 10/18/2015   Procedure: TRANSESOPHAGEAL ECHOCARDIOGRAM (TEE);  Surgeon: [name], MD;  Location: [facility];  Service: Open Heart Surgery;  Laterality: N/A;    Family History  Problem Relation Age of Onset   Heart attack Mother    Stroke Mother    Colon cancer Brother        late 60s    SOCIAL HISTORY: Social History   Socioeconomic History   Marital status: Married    Spouse name: Not on file   Number of children: Not on file   Years of education: Not on file   Highest education level: Not on file   Occupational History   Not on file  Tobacco Use   Smoking status: Former    Packs/day: 2.00    Years: 30.00    Pack years: 60.00    Types: Cigarettes    Quit date: 08/14/2000    Years since quitting: 21.0   Smokeless tobacco: Never  Vaping Use   Vaping Use: Never used  Substance and Sexual Activity   Alcohol use: No   Drug use: No   Sexual activity: Not on file  Other Topics Concern   Not on file  Social History Narrative   Not on file   Social Determinants of Health   Financial Resource Strain: Not on file  Food Insecurity: Not on file  Transportation Needs: Not on file  Physical Activity: Not on file  Stress: Not on file  Social Connections: Not on file  Intimate Partner Violence: Not on file    No Known Allergies  Current Outpatient Medications  Medication Sig Dispense Refill   aspirin EC 81 MG tablet Take 1 tablet (81 mg total) by  mouth daily with breakfast. (Patient taking differently: Take 81 mg by mouth at bedtime.) 30 tablet 2   atorvastatin (LIPITOR) 40 MG tablet Take 1 tablet (40 mg total) by mouth daily at 6 PM. (Patient taking differently: Take 40 mg by mouth at bedtime.) 30 tablet 1   cilostazol (PLETAL) 100 MG tablet Take 100 mg by mouth 2 (two) times daily.     empagliflozin (JARDIANCE) 25 MG TABS tablet Take 12.5 mg by mouth daily.      ferrous sulfate 325 (65 FE) MG tablet Take 325 mg by mouth 3 (three) times daily.      gabapentin (NEURONTIN) 600 MG tablet Take 600-1,200 mg by mouth See admin instructions. Take one tablet (600 mg) by mouth at morning and noon, take two tablets (1200 mg) at bedtime     insulin glargine (LANTUS) 100 UNIT/ML injection Inject 20 Units into the skin at bedtime.     losartan (COZAAR) 50 MG tablet Take 50 mg by mouth daily.     metFORMIN (GLUCOPHAGE) 1000 MG tablet Take 1,000 mg by mouth 2 (two) times daily with a meal.      metoprolol tartrate (LOPRESSOR) 50 MG tablet Take 25 mg by mouth 2 (two) times daily.       pantoprazole (PROTONIX) 40 MG tablet Take 1 tablet (40 mg total) by mouth daily. 5 tablet 0   Semaglutide,0.25 or 0.5MG /DOS, (OZEMPIC, 0.25 OR 0.5 MG/DOSE,) 2 MG/1.5ML SOPN Inject 0.25 mg into the skin every Sunday. Sunday night     vitamin B-12 (CYANOCOBALAMIN) 1000 MCG tablet Take 1,000 mcg by mouth daily.     No current facility-administered medications for this visit.    REVIEW OF SYSTEMS:  [X]  denotes positive finding, [ ]  denotes negative finding Cardiac  Comments:  Chest pain or chest pressure:    Shortness of breath upon exertion:    Short of breath when lying flat:    Irregular heart rhythm:        Vascular    Pain in calf, thigh, or hip brought on by ambulation:    Pain in feet at night that wakes you up from your sleep:     Blood clot in your veins:    Leg swelling:         Pulmonary    Oxygen at home:    Productive cough:     Wheezing:         Neurologic    Sudden weakness in arms or legs:     Sudden numbness in arms or legs:     Sudden onset of difficulty speaking or slurred speech:    Temporary loss of vision in one eye:     Problems with dizziness:         Gastrointestinal    Blood in stool:     Vomited blood:         Genitourinary    Burning when urinating:     Blood in urine:        Psychiatric    Major depression:         Hematologic    Bleeding problems:    Problems with blood clotting too easily:        Skin    Rashes or ulcers:        Constitutional    Fever or chills:      PHYSICAL EXAM: Vitals:   09/06/21 0904  BP: (!) 146/76  Pulse: 70  Resp: 16  Temp: 97.9 F (36.6 C)  TempSrc: Temporal  SpO2: 97%  Weight: 146 lb (66.2 kg)  Height: 5\' 8"  (1.727 m)    GENERAL: The patient is a well-nourished male, in no acute distress. The vital signs are documented above. CARDIAC: There is a regular rate and rhythm.  VASCULAR:  Palpable femoral pulse bilaterally Palpable pulse in femorofemoral bypass No palpable pedal pulses No lower  extremity tissue loss PULMONARY: No respiratory distress. ABDOMEN: Soft and non-tender. MUSCULOSKELETAL: There are no major deformities or cyanosis. NEUROLOGIC: No focal weakness or paresthesias are detected. SKIN: There are no ulcers or rashes noted. PSYCHIATRIC: The patient has a normal affect.  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  He has heavily calcified stenosis in the right common and external iliac artery with a patent right to left femorofemoral bypass.  Both SFAs are occluded with profunda runoff.     Assessment/Plan:  76 year old male presents from the [place] for evaluation of abdominal aneurysm per the referral.  I discussed with both the patient and his spouse that after review of the CTA above I do not see any evidence of aneurysmal degeneration although he does have a chronic small dissection.  Discussed no intervention from my standpoint and will continue surveillance.  Does not appear flow-limiting with a palpable right femoral pulse and a palpable pulse in the femorofemoral bypass.  His aortoiliac duplex today does show right common iliac artery high-grade stenosis greater than 70% with post stenotic turbulence and also high-grade stenosis in the right external iliac artery.  This is the inflow for his right to left femorofemoral bypass that was done by Dr. Amedeo Plenty years ago.  I discussed I do think he needs aortogram with potential iliac intervention to maintain patency of his femoral femoral bypass.  This may be from a brachial approach which he has had in the past.  We will get him scheduled for the Cath Lab.  I discussed risk and benefits.   [name], MD [facility] Office: [phone]

## 2021-09-07 ENCOUNTER — Other Ambulatory Visit: Payer: Self-pay

## 2021-09-12 ENCOUNTER — Other Ambulatory Visit (HOSPITAL_COMMUNITY): Payer: Medicare HMO

## 2021-09-12 ENCOUNTER — Encounter: Payer: Medicare HMO | Admitting: Surgery

## 2021-09-12 ENCOUNTER — Telehealth: Payer: Self-pay | Admitting: Orthopaedic Surgery

## 2021-09-12 NOTE — Telephone Encounter (Signed)
08/17/21 ov note faxed to Dept. [facility]

## 2021-09-22 ENCOUNTER — Ambulatory Visit (HOSPITAL_COMMUNITY)
Admission: RE | Admit: 2021-09-22 | Discharge: 2021-09-22 | Disposition: A | Discharge status: Home / Self Care | Payer: No Typology Code available for payment source | Attending: Vascular Surgery | Admitting: Vascular Surgery

## 2021-09-22 ENCOUNTER — Encounter (HOSPITAL_COMMUNITY)
Admission: RE | Disposition: A | Discharge status: Home / Self Care | Payer: Self-pay | Source: Home / Self Care | Attending: Vascular Surgery

## 2021-09-22 DIAGNOSIS — E785 Hyperlipidemia, unspecified: Secondary | ICD-10-CM

## 2021-09-22 DIAGNOSIS — I2510 Atherosclerotic heart disease of native coronary artery without angina pectoris: Secondary | ICD-10-CM | POA: Insufficient documentation

## 2021-09-22 DIAGNOSIS — I129 Hypertensive chronic kidney disease with stage 1 through stage 4 chronic kidney disease, or unspecified chronic kidney disease: Secondary | ICD-10-CM | POA: Insufficient documentation

## 2021-09-22 DIAGNOSIS — I739 Peripheral vascular disease, unspecified: Secondary | ICD-10-CM

## 2021-09-22 DIAGNOSIS — I10 Essential (primary) hypertension: Secondary | ICD-10-CM

## 2021-09-22 DIAGNOSIS — I701 Atherosclerosis of renal artery: Secondary | ICD-10-CM

## 2021-09-22 DIAGNOSIS — Z87891 Personal history of nicotine dependence: Secondary | ICD-10-CM | POA: Insufficient documentation

## 2021-09-22 DIAGNOSIS — K55059 Acute (reversible) ischemia of intestine, part and extent unspecified: Secondary | ICD-10-CM

## 2021-09-22 DIAGNOSIS — E1151 Type 2 diabetes mellitus with diabetic peripheral angiopathy without gangrene: Secondary | ICD-10-CM | POA: Diagnosis present

## 2021-09-22 DIAGNOSIS — Z794 Long term (current) use of insulin: Secondary | ICD-10-CM | POA: Insufficient documentation

## 2021-09-22 DIAGNOSIS — Z7985 Long-term (current) use of injectable non-insulin antidiabetic drugs: Secondary | ICD-10-CM | POA: Insufficient documentation

## 2021-09-22 DIAGNOSIS — Z7984 Long term (current) use of oral hypoglycemic drugs: Secondary | ICD-10-CM | POA: Diagnosis not present

## 2021-09-22 DIAGNOSIS — I708 Atherosclerosis of other arteries: Secondary | ICD-10-CM | POA: Insufficient documentation

## 2021-09-22 DIAGNOSIS — T82868A Thrombosis of vascular prosthetic devices, implants and grafts, initial encounter: Secondary | ICD-10-CM | POA: Diagnosis not present

## 2021-09-22 DIAGNOSIS — Z95820 Peripheral vascular angioplasty status with implants and grafts: Secondary | ICD-10-CM

## 2021-09-22 DIAGNOSIS — I774 Celiac artery compression syndrome: Secondary | ICD-10-CM

## 2021-09-22 DIAGNOSIS — Z95828 Presence of other vascular implants and grafts: Secondary | ICD-10-CM

## 2021-09-22 DIAGNOSIS — I714 Abdominal aortic aneurysm, without rupture, unspecified: Secondary | ICD-10-CM

## 2021-09-22 DIAGNOSIS — I70203 Unspecified atherosclerosis of native arteries of extremities, bilateral legs: Secondary | ICD-10-CM | POA: Diagnosis not present

## 2021-09-22 DIAGNOSIS — E1122 Type 2 diabetes mellitus with diabetic chronic kidney disease: Secondary | ICD-10-CM | POA: Insufficient documentation

## 2021-09-22 DIAGNOSIS — I7102 Dissection of abdominal aorta: Secondary | ICD-10-CM

## 2021-09-22 DIAGNOSIS — N183 Chronic kidney disease, stage 3 unspecified: Secondary | ICD-10-CM | POA: Diagnosis not present

## 2021-09-22 DIAGNOSIS — E119 Type 2 diabetes mellitus without complications: Secondary | ICD-10-CM

## 2021-09-22 HISTORY — PX: ABDOMINAL AORTOGRAM W/LOWER EXTREMITY: CATH118223

## 2021-09-22 HISTORY — PX: PERIPHERAL VASCULAR INTERVENTION: CATH118257

## 2021-09-22 HISTORY — PX: PERIPHERAL VASCULAR BALLOON ANGIOPLASTY: CATH118281

## 2021-09-22 LAB — POCT ACTIVATED CLOTTING TIME
Activated Clotting Time: 239 seconds
Activated Clotting Time: 191 seconds
Activated Clotting Time: 179 seconds

## 2021-09-22 LAB — GLUCOSE, CAPILLARY
Glucose-Capillary: 133 mg/dL — ABNORMAL HIGH (ref 70–99)
Glucose-Capillary: 87 mg/dL (ref 70–99)

## 2021-09-22 SURGERY — ABDOMINAL AORTOGRAM W/LOWER EXTREMITY
Anesthesia: LOCAL | Laterality: Right

## 2021-09-22 MED ORDER — HEPARIN SODIUM (PORCINE) 1000 UNIT/ML IJ SOLN
INTRAMUSCULAR | Status: DC | PRN
  Administered 2021-09-22: 3000 [IU] via INTRAVENOUS
  Administered 2021-09-22: 5000 [IU] via INTRAVENOUS

## 2021-09-22 MED ORDER — FENTANYL CITRATE (PF) 100 MCG/2ML IJ SOLN
INTRAMUSCULAR | Status: DC | PRN
  Administered 2021-09-22: 25 ug via INTRAVENOUS

## 2021-09-22 MED ORDER — FENTANYL CITRATE (PF) 100 MCG/2ML IJ SOLN
INTRAMUSCULAR | Status: AC
  Filled 2021-09-22: qty 2

## 2021-09-22 MED ORDER — MIDAZOLAM HCL 2 MG/2ML IJ SOLN
INTRAMUSCULAR | Status: AC
  Filled 2021-09-22: qty 2

## 2021-09-22 MED ORDER — SODIUM CHLORIDE 0.9 % IV SOLN: INTRAVENOUS | Status: AC

## 2021-09-22 MED ORDER — SODIUM CHLORIDE 0.9 % IV SOLN: 250.0000 mL | INTRAVENOUS | Status: DC | PRN

## 2021-09-22 MED ORDER — HYDRALAZINE HCL 20 MG/ML IJ SOLN: 5.0000 mg | INTRAMUSCULAR | Status: DC | PRN

## 2021-09-22 MED ORDER — MIDAZOLAM HCL 2 MG/2ML IJ SOLN
INTRAMUSCULAR | Status: DC | PRN
  Administered 2021-09-22: 1 mg via INTRAVENOUS

## 2021-09-22 MED ORDER — SODIUM CHLORIDE 0.9% FLUSH: 3.0000 mL | Freq: Two times a day (BID) | INTRAVENOUS | Status: DC

## 2021-09-22 MED ORDER — LIDOCAINE HCL (PF) 1 % IJ SOLN
INTRAMUSCULAR | Status: DC | PRN
  Administered 2021-09-22: 18 mL via INTRADERMAL

## 2021-09-22 MED ORDER — LABETALOL HCL 5 MG/ML IV SOLN: 10.0000 mg | INTRAVENOUS | Status: DC | PRN

## 2021-09-22 MED ORDER — HEPARIN (PORCINE) IN NACL 1000-0.9 UT/500ML-% IV SOLN
INTRAVENOUS | Status: AC
  Filled 2021-09-22: qty 1000

## 2021-09-22 MED ORDER — IODIXANOL 320 MG/ML IV SOLN
INTRAVENOUS | Status: DC | PRN
  Administered 2021-09-22: 140 mL via INTRA_ARTERIAL

## 2021-09-22 MED ORDER — HEPARIN (PORCINE) IN NACL 1000-0.9 UT/500ML-% IV SOLN
INTRAVENOUS | Status: DC | PRN
  Administered 2021-09-22 (×2): 500 mL

## 2021-09-22 MED ORDER — ONDANSETRON HCL 4 MG/2ML IJ SOLN: 4.0000 mg | Freq: Four times a day (QID) | INTRAMUSCULAR | Status: DC | PRN

## 2021-09-22 MED ORDER — NITROGLYCERIN 1 MG/10 ML FOR IR/CATH LAB
INTRA_ARTERIAL | Status: AC
  Filled 2021-09-22: qty 10

## 2021-09-22 MED ORDER — HEPARIN SODIUM (PORCINE) 1000 UNIT/ML IJ SOLN
INTRAMUSCULAR | Status: AC
  Filled 2021-09-22: qty 10

## 2021-09-22 MED ORDER — SODIUM CHLORIDE 0.9% FLUSH: 3.0000 mL | INTRAVENOUS | Status: DC | PRN

## 2021-09-22 MED ORDER — LIDOCAINE HCL (PF) 1 % IJ SOLN
INTRAMUSCULAR | Status: AC
  Filled 2021-09-22: qty 30

## 2021-09-22 MED ORDER — ACETAMINOPHEN 325 MG PO TABS: 650.0000 mg | ORAL_TABLET | ORAL | Status: DC | PRN

## 2021-09-22 MED ORDER — SODIUM CHLORIDE 0.9 % IV SOLN: INTRAVENOUS | Status: DC

## 2021-09-22 SURGICAL SUPPLY — 19 items
BALLN IN.PACT DCB 5X40 (BALLOONS) ×9
CATH ANGIO 5F BER2 65CM (CATHETERS) ×1 IMPLANT
CATH OMNI FLUSH 5F 65CM (CATHETERS) ×1 IMPLANT
DCB IN.PACT 5X40 (BALLOONS) IMPLANT
DEVICE CONTINUOUS FLUSH (MISCELLANEOUS) ×1 IMPLANT
GLIDEWIRE ADV .035X260CM (WIRE) ×1 IMPLANT
GUIDEWIRE ANGLED .035X150CM (WIRE) ×1 IMPLANT
KIT ENCORE 26 ADVANTAGE (KITS) ×1 IMPLANT
KIT MICROPUNCTURE NIT STIFF (SHEATH) ×1 IMPLANT
KIT PV (KITS) ×4 IMPLANT
SHEATH PINNACLE 5F 10CM (SHEATH) ×1 IMPLANT
SHEATH PINNACLE 8F 10CM (SHEATH) ×1 IMPLANT
SHEATH PINNACLE ST 7F 45CM (SHEATH) ×2 IMPLANT
SHEATH PROBE COVER 6X72 (BAG) ×1 IMPLANT
STENT VIABAHN 7X29X80 VBX (Permanent Stent) ×1 IMPLANT
SYR MEDRAD MARK V 150ML (SYRINGE) ×1 IMPLANT
TRANSDUCER W/STOPCOCK (MISCELLANEOUS) ×4 IMPLANT
TRAY PV CATH (CUSTOM PROCEDURE TRAY) ×4 IMPLANT
WIRE BENTSON .035X145CM (WIRE) ×1 IMPLANT

## 2021-09-22 NOTE — H&P (Signed)
History and Physical Interval Note:  09/22/2021 7:53 AM  [name]  has presented today for surgery, with the diagnosis of pvd.  The various methods of treatment have been discussed with the patient and family. After consideration of risks, benefits and other options for treatment, the patient has consented to  Procedure(s): ABDOMINAL AORTOGRAM W/LOWER EXTREMITY (N/A) as a surgical intervention.  The patient's history has been reviewed, patient examined, no change in status, stable for surgery.  I have reviewed the patient's chart and labs.  Questions were answered to the patient's satisfaction.     [name]  Patient name: [name]    MRN: [id number]        DOB: February 22, 1946          Sex: male   REASON FOR CONSULT: Referral states evaluate abdominal aortic aneurysm   HPI: [name] is a 76 y.o. male, with history of coronary artery disease, diabetes, hypertension, hyperlipidemia, history of femoral-femoral bypass that presents from the [place] for what the referral states is evaluation of abdominal aortic aneurysm.  Patient states he believes he is here for an aneurysm as well.  He has been followed at the [place] recently.  States he has numbness in both feet at night.  Does not have pain in his feet or legs when he is walking.  No abdominal pain.   In review of the notes, appears he had a CTA aorta iliofemoral runoff on 06/06/2021 at the [place] and the study was ordered for cold feet.  This showed extensive plaque in the major branches with high-grade stenosis of the celiac, SMA, renal arteries and inferior mesenteric artery.  He has extensive calcified plaque in the right common and external iliac arteries with a patent femoral-femoral bypass graft and occluded SFAs bilaterally.  He has a known left common iliac occlusion.  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     In review of our records here, he had a femorofemoral bypass with Dr. Amedeo Plenty years ago.  As recently as  08/10/2016 he had a redo right femoral artery exposure with iliofemoral endarterectomy bovine patch and redo right limb of his femorofemoral bypass by Dr. Trula Slade.  On 09/19/2016 he then had ultrasound-guided access of left brachial artery with angioplasty of the right common femoral artery, stent of the right external iliac, and stent of the right common femoral artery also by Dr. Trula Slade.       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    x 5   Hypercholesteremia     Hypertension     Neuropathy     Peripheral vascular disease ([place])      s/p R-L FEM-FEM BYPASS   Pneumonia     Tachycardia      after CABG, pt on medicine for this           Past Surgical History:  Procedure Laterality Date   ABDOMINAL AORTOGRAM Bilateral 09/19/2016    Procedure: iliac;  Surgeon: Serafina Mitchell, MD;  Location: [facility];  Service: Cardiovascular;  Laterality: Bilateral;   CARDIAC CATHETERIZATION  2003    with stent   CARDIAC CATHETERIZATION N/A 10/11/2015    Procedure: Left Heart Cath and Coronary Angiography;  Surgeon: Peter M Martinique, MD;  Location: [facility];  Service: Cardiovascular;  Laterality: N/A;   COLON RESECTION N/A 12/13/2017    Procedure: EXPLORATORY LAPAROTOMY, SIGMOID COLECTOMY WITH COLOSTOMY;  Surgeon: Donnie Mesa, MD;  Location: [facility];  Service: General;  Laterality: N/A;   COLONOSCOPY N/A 09/22/2013    Procedure: COLONOSCOPY;  Surgeon: Daneil Dolin, MD;  [facility];  Service: Endoscopy;  Laterality: N/A;  9:30 AM   COLONOSCOPY N/A 02/19/2018    Procedure: COLONOSCOPY;  Surgeon: Daneil Dolin, MD;  [facility];  Service:  Endoscopy;  Laterality: N/A;  2:00pm   COLOSTOMY N/A 12/13/2017    Procedure: COLOSTOMY;  Surgeon: Donnie Mesa, MD;  Location: [facility];  Service: General;  Laterality: N/A;   COLOSTOMY REVERSAL N/A 03/26/2018    Procedure: COLOSTOMY REVERSAL;  Surgeon: Donnie Mesa, MD;  Location: [facility];  Service: General;  Laterality: N/A;   CORONARY ARTERY BYPASS GRAFT N/A 10/18/2015    Procedure: CORONARY ARTERY BYPASS GRAFTING (CABG) x  five, using left internal mammary artery and right leg greater saphenous vein harvested endoscopically;  Surgeon: [name], MD;  Location: [facility];  Service: Open Heart Surgery;  Laterality: N/A;   ENDARTERECTOMY FEMORAL Right 08/09/2016    Procedure: ENDARTERECTOMY FEMORAL WITH VEIN PATCH ANGIOPLASTY;  Surgeon: Serafina Mitchell, MD;  [facility];  Service: Vascular;  Laterality: Right;   ESOPHAGOGASTRODUODENOSCOPY N/A 10/24/2017    Dr. Gala Romney: hiatal hernia   FEMORAL-FEMORAL BYPASS GRAFT Bilateral 08/09/2016    Procedure: REVISION BYPASS GRAFT RIGHT FEMORAL-LEFT FEMORAL ARTERY;  Surgeon: Serafina Mitchell, MD;  Location: [facility];  Service: Vascular;  Laterality: Bilateral;   FEMORAL-POPLITEAL BYPASS GRAFT       INTRAMEDULLARY (IM) NAIL INTERTROCHANTERIC Left 04/03/2021    Procedure: LEFT  HIP INTRAMEDULLARY (IM) NAIL INTERTROCHANTRIC;  Surgeon: Mcarthur Rossetti, MD;  Location: [facility];  Service: Orthopedics;  Laterality: Left;   PERIPHERAL VASCULAR CATHETERIZATION N/A 05/08/2016    Procedure: Lower Extremity Angiography;  Surgeon: Lorretta Harp, MD;  Location: [facility];  Service: Cardiovascular;  Laterality: N/A;   PERIPHERAL VASCULAR INTERVENTION Right 09/19/2016    Procedure: Peripheral Vascular Intervention;  Surgeon: Serafina Mitchell, MD;  Location: [facility];  Service: Cardiovascular;  Laterality: Right;  ext iliac stent   POLYPECTOMY   02/19/2018    Procedure: POLYPECTOMY;  Surgeon: Daneil Dolin, MD;  [facility];  Service:  Endoscopy;;   PROCTOSCOPY   10/24/2017    Procedure: PROCTOSCOPY;  Surgeon: Daneil Dolin, MD;  [facility];  Service: Endoscopy;;   TEE WITHOUT CARDIOVERSION N/A 10/18/2015    Procedure: TRANSESOPHAGEAL ECHOCARDIOGRAM (TEE);  Surgeon: [name], MD;  Location: [facility];  Service: Open Heart Surgery;  Laterality: N/A;           Family History  Problem Relation Age of Onset   Heart attack Mother     Stroke Mother     Colon cancer Brother          late 20s      SOCIAL HISTORY: Social History         Socioeconomic History   Marital status: Married      Spouse name: Not on file   Number of children: Not on file   Years of  education: Not on file   Highest education level: Not on file  Occupational History   Not on file  Tobacco Use   Smoking status: Former      Packs/day: 2.00      Years: 30.00      Pack years: 60.00      Types: Cigarettes      Quit date: 08/14/2000      Years since quitting: 21.0   Smokeless tobacco: Never  Vaping Use   Vaping Use: Never used  Substance and Sexual Activity   Alcohol use: No   Drug use: No   Sexual activity: Not on file  Other Topics Concern   Not on file  Social History Narrative   Not on file    Social Determinants of Health    Financial Resource Strain: Not on file  Food Insecurity: Not on file  Transportation Needs: Not on file  Physical Activity: Not on file  Stress: Not on file  Social Connections: Not on file  Intimate Partner Violence: Not on file      No Known Allergies         Current Outpatient Medications  Medication Sig Dispense Refill   aspirin EC 81 MG tablet Take 1 tablet (81 mg total) by mouth daily with breakfast. (Patient taking differently: Take 81 mg by mouth at bedtime.) 30 tablet 2   atorvastatin (LIPITOR) 40 MG tablet Take 1 tablet (40 mg total) by mouth daily at 6 PM. (Patient taking differently: Take 40 mg by mouth at bedtime.) 30 tablet 1   cilostazol (PLETAL) 100 MG tablet Take  100 mg by mouth 2 (two) times daily.       empagliflozin (JARDIANCE) 25 MG TABS tablet Take 12.5 mg by mouth daily.        ferrous sulfate 325 (65 FE) MG tablet Take 325 mg by mouth 3 (three) times daily.        gabapentin (NEURONTIN) 600 MG tablet Take 600-1,200 mg by mouth See admin instructions. Take one tablet (600 mg) by mouth at morning and noon, take two tablets (1200 mg) at bedtime       insulin glargine (LANTUS) 100 UNIT/ML injection Inject 20 Units into the skin at bedtime.       losartan (COZAAR) 50 MG tablet Take 50 mg by mouth daily.       metFORMIN (GLUCOPHAGE) 1000 MG tablet Take 1,000 mg by mouth 2 (two) times daily with a meal.        metoprolol tartrate (LOPRESSOR) 50 MG tablet Take 25 mg by mouth 2 (two) times daily.        pantoprazole (PROTONIX) 40 MG tablet Take 1 tablet (40 mg total) by mouth daily. 5 tablet 0   Semaglutide,0.25 or 0.5MG /DOS, (OZEMPIC, 0.25 OR 0.5 MG/DOSE,) 2 MG/1.5ML SOPN Inject 0.25 mg into the skin every Sunday. Sunday night       vitamin B-12 (CYANOCOBALAMIN) 1000 MCG tablet Take 1,000 mcg by mouth daily.        No current facility-administered medications for this visit.      REVIEW OF SYSTEMS:  [X]  denotes positive finding, [ ]  denotes negative finding Cardiac   Comments:  Chest pain or chest pressure:      Shortness of breath upon exertion:      Short of breath when lying flat:      Irregular heart rhythm:             Vascular      Pain  in calf, thigh, or hip brought on by ambulation:      Pain in feet at night that wakes you up from your sleep:       Blood clot in your veins:      Leg swelling:              Pulmonary      Oxygen at home:      Productive cough:       Wheezing:              Neurologic      Sudden weakness in arms or legs:       Sudden numbness in arms or legs:       Sudden onset of difficulty speaking or slurred speech:      Temporary loss of vision in one eye:       Problems with dizziness:               Gastrointestinal      Blood in stool:       Vomited blood:              Genitourinary      Burning when urinating:       Blood in urine:             Psychiatric      Major depression:              Hematologic      Bleeding problems:      Problems with blood clotting too easily:             Skin      Rashes or ulcers:             Constitutional      Fever or chills:          PHYSICAL EXAM:    Vitals:    09/06/21 0904  BP: (!) 146/76  Pulse: 70  Resp: 16  Temp: 97.9 F (36.6 C)  TempSrc: Temporal  SpO2: 97%  Weight: 146 lb (66.2 kg)  Height: 5\' 8"  (1.727 m)      GENERAL: The patient is a well-nourished male, in no acute distress. The vital signs are documented above. CARDIAC: There is a regular rate and rhythm.  VASCULAR:  Palpable femoral pulse bilaterally Palpable pulse in femorofemoral bypass No palpable pedal pulses No lower extremity tissue loss PULMONARY: No respiratory distress. ABDOMEN: Soft and non-tender. MUSCULOSKELETAL: There are no major deformities or cyanosis. NEUROLOGIC: No focal weakness or paresthesias are detected. SKIN: There are no ulcers or rashes noted. PSYCHIATRIC: The patient has a normal affect.   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  He has heavily calcified stenosis in the right common and external iliac artery with a patent right to left femorofemoral bypass.  Both SFAs are occluded with profunda runoff.       Assessment/Plan:   76 year old male presents from the [place] for evaluation of abdominal aneurysm per the referral.  I discussed with both the patient and his spouse that after review of the CTA above I do not see any evidence of aneurysmal degeneration although he does have a chronic small dissection.  Discussed no intervention from my standpoint  and will continue surveillance.  Does not appear flow-limiting with a palpable right femoral pulse  and a palpable pulse in the femorofemoral bypass.   His aortoiliac duplex today does show right common iliac artery high-grade stenosis greater than 70% with post stenotic turbulence and also high-grade stenosis in the right external iliac artery.  This is the inflow for his right to left femorofemoral bypass that was done by Dr. Amedeo Plenty years ago.  I discussed I do think he needs aortogram with potential iliac intervention to maintain patency of his femoral femoral bypass.  This may be from a brachial approach which he has had in the past.  We will get him scheduled for the Cath Lab.  I discussed risk and benefits.     [name], MD [facility] Office: [phone]

## 2021-09-22 NOTE — Progress Notes (Signed)
Site area: left groin arterial sheath Site Prior to Removal:  Level 0, faint bruise at insertion site prior to pull Pressure Applied For: 25 minutes Manual:   yes Patient Status During Pull:  stable Post Pull Site:  Level 0 Post Pull Instructions Given:  yes Post Pull Pulses Present: left pt dopplered during manual hold and after hold Dressing Applied:  gauze and tegaderm Bedrest begins @ 3353 Comments:

## 2021-09-22 NOTE — Op Note (Signed)
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  Ultrasound-guided access right to left femorofemoral bypass retrograde 2.  Aortogram with bilateral lower extremity arteriogram including catheter selection of aorta 3.  Right common iliac artery angioplasty with stent placement (7 mm x 29 mm VBX) 4.  Right external iliac artery angioplasty with drug-coated balloon (5 mm x 40 mm drug-coated impact) 5.  49 minutes of monitored moderate conscious sedation time  Indications: Patient is a 76 year old male who was referred for evaluation of an abdominal aortic aneurysm and on further evaluation was found to have a right to left femoral-femoral bypass with significant stenosis of the donor artery in the right common and external iliac artery both with high grade stenosis by arterial duplex.[name]  He presents today for planned aortogram with lower extremity arteriogram and possible intervention to maintain patency of his bypass after risks benefits discussed.  Findings:   Aortogram showed a patent infrarenal aorta with an occluded left iliac system that is chronic.  His right common iliac had a recurrent high-grade stenosis in the proximal segment greater than 80% in an existing stent and he also had a second focal high-grade stenosis in the right external iliac artery greater than 80% in an existing stent as well.  The right hypogastric was patent.  The femorofemoral bypass was widely patent.  Profunda runoff bilateral lower extremities with occluded SFAs and reconstitution of above-knee popliteal arteries and then two-vessel runoff bilaterally through the PT AT.  The right AT does appear to occlude well above the  ankle.  Ultimately the iliac stenosis on the right was crossed retrograde and the right common iliac artery stenosis was stented with a 7 mm x 29 mm VBX and the right external iliac stenosis was treated with a drug-coated balloon with a 5 mm x 40 mm drug-coated impact.  No evidence of residual stenosis.  Widely patent donor artery and patent femoral femoral bypass with profunda runoff bilaterally.   Procedure:  The patient was identified in the holding area and taken to room 8.  The patient was then placed supine on the table and prepped and draped in the usual sterile fashion.  A time out was called.  Ultrasound was used to evaluate the femoral-femoral artery bypass.  It was patent .  A digital ultrasound image was acquired.  A micropuncture needle was used to access the femoral-femoral artery bypass under ultrasound guidance.  An 018 wire was advanced without resistance and a micropuncture sheath was placed.  The 018 wire was removed and a benson wire was placed.  The micropuncture sheath was exchanged for a 5 french sheath.  I then used BER 2 catheter with a Bentson wire to try and go retrograde up the right iliac artery.  The wire kept going down the profunda.  Ultimately I switched out for a Glidewire and was able to get retrograde up the right iliac into the infrarenal aorta.  I exchanged for my Bentson wire and then was able to track an Omni Flush catheter across the femorofemoral up the right iliac artery retrograde.  Aortogram was then obtained with evaluation of the donor right iliac artery and the femorofemoral bypass.  We then also got bilateral lower extremity runoff with pertinent findings noted  above.  Patient was given a total of 8000 units of IV heparin.  We elected intervene on the donor artery on the right.  I used a Glidewire advantage and then placed a 7 [place] long sheath through the femorofemoral bypass and retrograde up the right iliac artery.  We then got dedicated imaging in steep left  oblique to see the right common iliac stenosis and a 7 mm x 29 mm VBX was deployed in the right common iliac artery and then we treated the right external iliac artery with a 5 mm x 40 mm drug-coated impact to nominal pressure for 3 minutes.  Excellent results with widely patent infrarenal aorta and right common and external iliac artery and a patent bypass with profunda runoff bilaterally.  Satisfied with results wires were removed and the sheath was pulled back into the femorofemoral bypass.  He remained stable and will be taken home and to have the sheath removed.   Plan: Patient will continue dual antiplatelet therapy with aspirin Plavix.  We will arrange follow-up in 1 month with repeat duplex of the aortoiliac segment.  [name], MD [facility] Office: [phone]

## 2021-09-23 ENCOUNTER — Encounter (HOSPITAL_COMMUNITY): Payer: Self-pay | Admitting: Vascular Surgery

## 2021-09-23 LAB — POCT I-STAT, CHEM 8
BUN: 25 mg/dL — ABNORMAL HIGH (ref 8–23)
Calcium, Ion: 1.03 mmol/L — ABNORMAL LOW (ref 1.15–1.40)
Chloride: 101 mmol/L (ref 98–111)
Creatinine, Ser: 1.5 mg/dL — ABNORMAL HIGH (ref 0.61–1.24)
Glucose, Bld: 159 mg/dL — ABNORMAL HIGH (ref 70–99)
HCT: 36 % — ABNORMAL LOW (ref 39.0–52.0)
Hemoglobin: 12.2 g/dL — ABNORMAL LOW (ref 13.0–17.0)
Potassium: 4.8 mmol/L (ref 3.5–5.1)
Sodium: 136 mmol/L (ref 135–145)
TCO2: 24 mmol/L (ref 22–32)

## 2021-09-23 LAB — POCT ACTIVATED CLOTTING TIME: Activated Clotting Time: 251 seconds

## 2021-10-04 ENCOUNTER — Other Ambulatory Visit (HOSPITAL_COMMUNITY): Payer: Self-pay | Admitting: Cardiovascular Disease

## 2021-11-11 ENCOUNTER — Other Ambulatory Visit: Payer: Self-pay | Admitting: *Deleted

## 2021-11-11 DIAGNOSIS — I714 Abdominal aortic aneurysm, without rupture, unspecified: Secondary | ICD-10-CM

## 2021-11-11 DIAGNOSIS — I739 Peripheral vascular disease, unspecified: Secondary | ICD-10-CM

## 2021-11-14 NOTE — Progress Notes (Signed)
?HISTORY AND PHYSICAL  ? ? ? ?CC:  follow up. ?Requesting Provider:  [name], MD ? ?HPI: This is a 76 y.o. male who is here today for follow up for PAD.  He has hx of femorofemoral bypass with Dr. Amedeo Plenty years ago.  As recently as 08/10/2016 he had a redo right femoral artery exposure with iliofemoral endarterectomy bovine patch and redo right limb of his femorofemoral bypass by Dr. Trula Slade.  On 09/19/2016 he then had ultrasound-guided access of left brachial artery with angioplasty of the right common femoral artery, stent of the right external iliac, and stent of the right common femoral artery also by Dr. Trula Slade. ? ?Most recently, he was seen for evaluation of AAA and during evaluation, he was found to have a significant stenosis of the donor artery in the right CIA and EIA both with high grade stenosis.  He underwent arteriogram with right CIA angioplasty with stenting, right EIA with drug coated balloon on 09/22/2021 by Dr. Carlis Abbott.   ? ?Dr. 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  ? ?He has bilateral carotid artery stenosis of 1-39% bilaterally that is followed by Dr. Gwenlyn Found. ? ?The pt returns today for follow up studies.  He states he is doing well.  He has not had any claudication, rest pain or non healing wounds.  He is compliant with his asa/statin.  He states that he walks to his mailbox and back without any difficulty and is a little better than before procedure.   ? ?The pt is on a statin for cholesterol management.    ?The pt is on an aspirin.    Other AC:  Plavix ?The pt is on CCB, BB for hypertension.  ?The pt does have diabetes. ?Tobacco hx:  former ? ? ? ?Past Medical History:  ?Diagnosis Date  ? CAD S/P percutaneous coronary angioplasty 1998  ? PCI TO CX  ? CKD (chronic kidney disease) stage 3, GFR 30-59 ml/min (HCC)   ? Diabetes mellitus without  complication ([place])   ? Diverticulitis 08/16/2017  ? hospitalized with diverticulitis/sepsis  ? GERD (gastroesophageal reflux disease)   ? occassionally  ? 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 x 5  ? Hypercholesteremia   ? Hypertension   ? Neuropathy   ? Peripheral vascular disease ([place])   ? s/p R-L FEM-FEM BYPASS  ? Pneumonia   ? Tachycardia   ? after CABG, pt on medicine for this  ? ? ?Past Surgical History:  ?Procedure Laterality Date  ? ABDOMINAL AORTOGRAM Bilateral 09/19/2016  ? Procedure: iliac;  Surgeon: Serafina Mitchell, MD;  Location: [facility];  Service: Cardiovascular;  Laterality: Bilateral;  ? ABDOMINAL AORTOGRAM W/LOWER EXTREMITY N/A 09/22/2021  ? Procedure: ABDOMINAL AORTOGRAM W/LOWER EXTREMITY;  Surgeon: [name], MD;  Location: [facility];  Service: Cardiovascular;  Laterality: N/A;  ? CARDIAC CATHETERIZATION  2003  ? with stent  ? CARDIAC CATHETERIZATION N/A 10/11/2015  ? Procedure: Left Heart Cath and Coronary Angiography;  Surgeon: Peter M Martinique, MD;  Location: [facility];  Service: Cardiovascular;  Laterality: N/A;  ? COLON RESECTION N/A 12/13/2017  ? Procedure: EXPLORATORY LAPAROTOMY, SIGMOID COLECTOMY WITH COLOSTOMY;  Surgeon: Donnie Mesa, MD;  Location: [facility];  Service: General;  Laterality: N/A;  ? COLONOSCOPY N/A 09/22/2013  ? Procedure: COLONOSCOPY;  Surgeon: Daneil Dolin,  MD;  [facility];  Service: Endoscopy;  Laterality: N/A;  9:30 AM  ? COLONOSCOPY N/A 02/19/2018  ? Procedure: COLONOSCOPY;  Surgeon: Daneil Dolin, MD;  [facility];  Service: Endoscopy;  Laterality: N/A;  2:00pm  ? COLOSTOMY N/A 12/13/2017  ? Procedure: COLOSTOMY;  Surgeon: Donnie Mesa, MD;  Location: [facility];  Service: General;  Laterality: N/A;  ? COLOSTOMY REVERSAL N/A 03/26/2018  ? Procedure: COLOSTOMY REVERSAL;  Surgeon: Donnie Mesa, MD;  Location: [facility];  Service: General;  Laterality: N/A;  ? CORONARY ARTERY BYPASS GRAFT N/A 10/18/2015  ? Procedure: CORONARY ARTERY  BYPASS GRAFTING (CABG) x  five, using left internal mammary artery and right leg greater saphenous vein harvested endoscopically;  Surgeon: [name], MD;  Location: [facility];  Service: Open Heart Surgery;  Laterality: N/A;  ? ENDARTERECTOMY FEMORAL Right 08/09/2016  ? Procedure: ENDARTERECTOMY FEMORAL WITH VEIN PATCH ANGIOPLASTY;  Surgeon: Serafina Mitchell, MD;  Location: [facility];  Service: Vascular;  Laterality: Right;  ? ESOPHAGOGASTRODUODENOSCOPY N/A 10/24/2017  ? Dr. Gala Romney: hiatal hernia  ? FEMORAL-FEMORAL BYPASS GRAFT Bilateral 08/09/2016  ? Procedure: REVISION BYPASS GRAFT RIGHT FEMORAL-LEFT FEMORAL ARTERY;  Surgeon: Serafina Mitchell, MD;  Location: [facility];  Service: Vascular;  Laterality: Bilateral;  ? FEMORAL-POPLITEAL BYPASS GRAFT    ? INTRAMEDULLARY (IM) NAIL INTERTROCHANTERIC Left 04/03/2021  ? Procedure: LEFT  HIP INTRAMEDULLARY (IM) NAIL INTERTROCHANTRIC;  Surgeon: Mcarthur Rossetti, MD;  Location: [facility];  Service: Orthopedics;  Laterality: Left;  ? PERIPHERAL VASCULAR BALLOON ANGIOPLASTY Right 09/22/2021  ? Procedure: PERIPHERAL VASCULAR BALLOON ANGIOPLASTY;  Surgeon: [name], MD;  Location: [facility];  Service: Cardiovascular;  Laterality: Right;  ? PERIPHERAL VASCULAR CATHETERIZATION N/A 05/08/2016  ? Procedure: Lower Extremity Angiography;  Surgeon: Lorretta Harp, MD;  Location: [facility];  Service: Cardiovascular;  Laterality: N/A;  ? PERIPHERAL VASCULAR INTERVENTION Right 09/19/2016  ? Procedure: Peripheral Vascular Intervention;  Surgeon: Serafina Mitchell, MD;  Location: [facility];  Service: Cardiovascular;  Laterality: Right;  ext iliac stent  ? PERIPHERAL VASCULAR INTERVENTION Right 09/22/2021  ? Procedure: PERIPHERAL VASCULAR INTERVENTION;  Surgeon: [name], MD;  Location: [facility];  Service: Cardiovascular;  Laterality: Right;  ? POLYPECTOMY  02/19/2018  ? Procedure: POLYPECTOMY;  Surgeon: Daneil Dolin, MD;  [facility];  Service: Endoscopy;;  ? PROCTOSCOPY  10/24/2017  ? Procedure: PROCTOSCOPY;  Surgeon: Daneil Dolin, MD;  [facility];  Service: Endoscopy;;  ? TEE WITHOUT CARDIOVERSION N/A 10/18/2015  ? Procedure: TRANSESOPHAGEAL ECHOCARDIOGRAM (TEE);  Surgeon: [name], MD;  Location: [facility];  Service: Open Heart Surgery;  Laterality: N/A;  ? ? ?No Known Allergies ? ?Current Outpatient Medications  ?Medication Sig Dispense Refill  ? albuterol (VENTOLIN HFA) 108 (90 Base) MCG/ACT inhaler Inhale 1-2 puffs into the lungs every 6 (six) hours as needed for wheezing or shortness of breath.    ? amLODipine (NORVASC) 10 MG tablet Take 10 mg by mouth in the morning.    ? aspirin EC 81 MG tablet Take 1 tablet (81 mg total) by mouth daily with breakfast. 30 tablet 2  ? atorvastatin (LIPITOR) 40 MG tablet Take 1 tablet (40 mg total) by mouth daily at 6 PM. (Patient taking differently: Take 40 mg by mouth at bedtime.) 30 tablet 1  ? cilostazol (PLETAL) 100 MG tablet Take 100 mg by mouth 2 (two) times daily.    ?  clopidogrel (PLAVIX) 75 MG tablet Take 75 mg by mouth in the morning.    ? empagliflozin (JARDIANCE) 25 MG TABS tablet Take 12.5 mg by mouth in the morning.    ? gabapentin (NEURONTIN) 600 MG tablet Take 1,800 mg by mouth in the morning and at bedtime.    ? guaiFENesin (MUCINEX) 600 MG 12 hr tablet Take 600 mg by mouth 2 (two) times daily as needed for cough or to loosen phlegm.    ? hydrochlorothiazide (HYDRODIURIL) 12.5 MG tablet Take 12.5 mg by mouth in the morning.    ? insulin glargine (LANTUS) 100 UNIT/ML injection Inject 20 Units into the skin at bedtime.    ? metFORMIN (GLUCOPHAGE) 1000 MG tablet Take 1,000 mg by mouth 2 (two) times daily with a meal.     ? metoprolol tartrate (LOPRESSOR) 50 MG tablet Take 25 mg by mouth 2 (two) times daily.     ? pantoprazole (PROTONIX) 40 MG tablet Take 1 tablet (40 mg total) by mouth daily. 5 tablet 0  ? Semaglutide,0.25 or 0.'5MG'$ /DOS, (OZEMPIC, 0.25 OR 0.5  MG/DOSE,) 2 MG/1.5ML SOPN Inject 0.5 mg into the skin every Sunday. Sunday night    ? vitamin B-12 (CYANOCOBALAMIN) 1000 MCG tablet Take 1,000 mcg by mouth in the morning.    ? ?No current facility-administered

## 2021-11-15 ENCOUNTER — Ambulatory Visit (HOSPITAL_COMMUNITY)
Admission: RE | Admit: 2021-11-15 | Discharge: 2021-11-15 | Disposition: A | Discharge status: Home / Self Care | Payer: No Typology Code available for payment source | Source: Ambulatory Visit | Attending: Vascular Surgery | Admitting: Vascular Surgery

## 2021-11-15 ENCOUNTER — Ambulatory Visit (INDEPENDENT_AMBULATORY_CARE_PROVIDER_SITE_OTHER)
Admit: 2021-11-15 | Discharge: 2021-11-15 | Disposition: A | Discharge status: Home / Self Care | Payer: No Typology Code available for payment source | Attending: Vascular Surgery | Admitting: Vascular Surgery

## 2021-11-15 ENCOUNTER — Encounter: Payer: Self-pay | Admitting: Physician Assistant

## 2021-11-15 ENCOUNTER — Ambulatory Visit (INDEPENDENT_AMBULATORY_CARE_PROVIDER_SITE_OTHER): Payer: No Typology Code available for payment source | Admitting: Physician Assistant

## 2021-11-15 VITALS — BP 139/64 | HR 71 | Temp 98.2°F | Resp 18 | Ht 67.0 in | Wt 143.1 lb

## 2021-11-15 DIAGNOSIS — I714 Abdominal aortic aneurysm, without rupture, unspecified: Secondary | ICD-10-CM

## 2021-11-15 DIAGNOSIS — I25810 Atherosclerosis of coronary artery bypass graft(s) without angina pectoris: Secondary | ICD-10-CM | POA: Insufficient documentation

## 2021-11-15 DIAGNOSIS — I739 Peripheral vascular disease, unspecified: Secondary | ICD-10-CM

## 2021-11-23 ENCOUNTER — Other Ambulatory Visit: Payer: Self-pay | Admitting: *Deleted

## 2021-11-23 DIAGNOSIS — I714 Abdominal aortic aneurysm, without rupture, unspecified: Secondary | ICD-10-CM

## 2021-11-23 DIAGNOSIS — I739 Peripheral vascular disease, unspecified: Secondary | ICD-10-CM

## 2021-12-03 ENCOUNTER — Other Ambulatory Visit: Payer: Self-pay

## 2021-12-03 ENCOUNTER — Emergency Department (HOSPITAL_COMMUNITY)
Admission: EM | Admit: 2021-12-03 | Discharge: 2021-12-03 | Disposition: A | Discharge status: Home / Self Care | Payer: No Typology Code available for payment source | Attending: Emergency Medicine | Admitting: Emergency Medicine

## 2021-12-03 ENCOUNTER — Encounter (HOSPITAL_COMMUNITY): Payer: Self-pay

## 2021-12-03 DIAGNOSIS — L568 Other specified acute skin changes due to ultraviolet radiation: Secondary | ICD-10-CM

## 2021-12-03 DIAGNOSIS — Z7982 Long term (current) use of aspirin: Secondary | ICD-10-CM | POA: Insufficient documentation

## 2021-12-03 DIAGNOSIS — X32XXXA Exposure to sunlight, initial encounter: Secondary | ICD-10-CM | POA: Insufficient documentation

## 2021-12-03 DIAGNOSIS — L562 Photocontact dermatitis [berloque dermatitis]: Secondary | ICD-10-CM | POA: Diagnosis not present

## 2021-12-03 DIAGNOSIS — Z7902 Long term (current) use of antithrombotics/antiplatelets: Secondary | ICD-10-CM | POA: Diagnosis not present

## 2021-12-03 DIAGNOSIS — R21 Rash and other nonspecific skin eruption: Secondary | ICD-10-CM | POA: Diagnosis present

## 2021-12-03 NOTE — ED Provider Notes (Signed)
?[facility] ?Provider Note ? ? ?CSN: [id number] ?Arrival date & time: 12/03/21  1708 ? ?  ? ?History ? ?Chief Complaint  ?Patient presents with  ? Rash  ? ? ?[name] is a 76 y.o. male.  He is here for evaluation of a rash that started yesterday.  He was on an antibiotic for pneumonia and he went to the beach and was exposed to sun for the last 2 days.  Starting yesterday he broke out in a rash redness to his head forearms and legs in the sun exposed areas.  He has some crusting lesions on his nose.  He says its not painful and there is no fever nausea or vomiting.  He said he does feel cold at times he has tried nothing for it.  He has finished the antibiotics. ? ?The history is provided by the patient and the spouse.  ?Rash ?Location:  Face, head/neck, leg and shoulder/arm ?Head/neck rash location:  Scalp ?Facial rash location:  Face ?Shoulder/arm rash location:  L forearm and R forearm ?Leg rash location:  L lower leg and R lower leg ?Quality: dryness and redness   ?Quality: not blistering and not painful   ?Severity:  Moderate ?Onset quality:  Gradual ?Duration:  2 days ?Timing:  Constant ?Progression:  Unchanged ?Chronicity:  New ?Context: medications and sun exposure   ?Relieved by:  None tried ?Worsened by:  Nothing ?Ineffective treatments:  None tried ?Associated symptoms: no abdominal pain, no fever, no nausea, no shortness of breath and not vomiting   ? ?  ? ?Home Medications ?Prior to Admission medications   ?Medication Sig Start Date End Date Taking? Authorizing Provider  ?albuterol (VENTOLIN HFA) 108 (90 Base) MCG/ACT inhaler Inhale 1-2 puffs into the lungs every 6 (six) hours as needed for wheezing or shortness of breath.    [provider]  ?amLODipine (NORVASC) 10 MG tablet Take 10 mg by mouth in the morning.    [provider]  ?aspirin EC 81 MG tablet Take 1 tablet (81 mg total) by mouth daily with breakfast. 05/23/19   Denton Brick, Courage, MD  ?atorvastatin  (LIPITOR) 40 MG tablet Take 1 tablet (40 mg total) by mouth daily at 6 PM. ?Patient taking differently: Take 40 mg by mouth at bedtime. 09/16/18   Orson Eva, MD  ?cilostazol (PLETAL) 100 MG tablet Take 100 mg by mouth 2 (two) times daily.    [provider]  ?clopidogrel (PLAVIX) 75 MG tablet Take 75 mg by mouth in the morning.    [provider]  ?empagliflozin (JARDIANCE) 25 MG TABS tablet Take 12.5 mg by mouth in the morning.    [provider]  ?gabapentin (NEURONTIN) 600 MG tablet Take 1,800 mg by mouth in the morning and at bedtime.    [provider]  ?guaiFENesin (MUCINEX) 600 MG 12 hr tablet Take 600 mg by mouth 2 (two) times daily as needed for cough or to loosen phlegm.    [provider]  ?hydrochlorothiazide (HYDRODIURIL) 12.5 MG tablet Take 12.5 mg by mouth in the morning.    [provider]  ?insulin glargine (LANTUS) 100 UNIT/ML injection Inject 20 Units into the skin at bedtime.    [provider]  ?metFORMIN (GLUCOPHAGE) 1000 MG tablet Take 1,000 mg by mouth 2 (two) times daily with a meal.     [provider]  ?metoprolol tartrate (LOPRESSOR) 50 MG tablet Take 25 mg by mouth 2 (two) times daily.  [provider]  ?pantoprazole (PROTONIX) 40 MG tablet Take 1 tablet (40 mg total) by mouth daily. 08/19/17   Dhungel, Flonnie Overman, MD  ?Semaglutide,0.25 or 0.'5MG'$ /DOS, (OZEMPIC, 0.25 OR 0.5 MG/DOSE,) 2 MG/1.5ML SOPN Inject 0.5 mg into the skin every Sunday. Sunday night    [provider]  ?vitamin B-12 (CYANOCOBALAMIN) 1000 MCG tablet Take 1,000 mcg by mouth in the morning.    [provider]  ?   ? ?Allergies    ?Patient has no known allergies.   ? ?Review of Systems   ?Review of Systems  ?Constitutional:  Negative for fever.  ?Respiratory:  Negative for shortness of breath.   ?Gastrointestinal:  Negative for abdominal pain, nausea and vomiting.  ?Skin:  Positive for rash.  ? ?Physical Exam ?Updated Vital  Signs ?BP (!) 141/75 (BP Location: Right Arm)   Pulse 75   Temp 98.1 ?F (36.7 ?C) (Oral)   Resp 18   Ht '5\' 7"'$  (1.702 m)   Wt 64.9 kg   SpO2 93%   BMI 22.40 kg/m?  ?Physical Exam ?Vitals and nursing note reviewed.  ?Constitutional:   ?   General: He is not in acute distress. ?   Appearance: Normal appearance. He is well-developed.  ?HENT:  ?   Head: Normocephalic and atraumatic.  ?Eyes:  ?   Conjunctiva/sclera: Conjunctivae normal.  ?Cardiovascular:  ?   Rate and Rhythm: Normal rate and regular rhythm.  ?   Heart sounds: No murmur heard. ?Pulmonary:  ?   Effort: Pulmonary effort is normal. No respiratory distress.  ?   Breath sounds: Normal breath sounds.  ?Abdominal:  ?   Palpations: Abdomen is soft.  ?   Tenderness: There is no abdominal tenderness.  ?Musculoskeletal:     ?   General: No swelling.  ?   Cervical back: Neck supple.  ?Skin: ?   General: Skin is warm and dry.  ?   Capillary Refill: Capillary refill takes less than 2 seconds.  ?   Findings: Erythema and rash present.  ?   Comments: He has erythema over the sun exposed areas over his face nose forearms hands and legs below his knees.  Skin is dry.  There is no blistering.  There is no bulla or vesicula  ?Neurological:  ?   General: No focal deficit present.  ?   Mental Status: He is alert.  ? ? ?ED Results / Procedures / Treatments   ?Labs ?(all labs ordered are listed, but only abnormal results are displayed) ?Labs Reviewed - No data to display ? ?EKG ?None ? ?Radiology ?No results found. ? ?Procedures ?Procedures  ? ? ?Medications Ordered in ED ?Medications - No data to display ? ?ED Course/ Medical Decision Making/ A&P ?  ?                        ?Medical Decision Making ?Patient here with acute erythema over sun exposed areas in the setting of being at the beach and being on antibiotics.  Likely has a photodermatitis possibly exacerbated by antibiotics.  Is otherwise clinically well-appearing.  No indications for admission or further testing  at this time.  Recommended using moisturizer and avoiding sun exposure.  Patient comfortable plan for discharge and outpatient follow-up with his treating provider.  Return instructions discussed ? ? ? ? ? ? ? ? ?Final Clinical Impression(s) / ED Diagnoses ?Final diagnoses:  ?Photodermatitis due to sun  ? ? ?Rx / DC Orders ?ED  Discharge Orders   ? ? None  ? ?  ? ? ?  ?Hayden Rasmussen, MD ?12/04/21 [phone] ? ?

## 2021-12-03 NOTE — ED Triage Notes (Signed)
Reports has sunburn to face arms and legs with little red bumps developing  Reports he is also on an antibiotics for pna unsure if that is what rash is from.  ?

## 2021-12-03 NOTE — Discharge Instructions (Signed)
You were seen in the emergency department for a rash over your face arms and legs in the sun exposed areas.  This is likely due to sun exposure possibly related to the new medication.  Please use plenty of moisturizer to your skin.  Avoid any further sun exposure for a few days.  Drink plenty of fluids.  Follow-up with your regular doctor.  Return to the emergency department if any worsening or concerning symptoms ?

## 2021-12-21 ENCOUNTER — Other Ambulatory Visit: Payer: Self-pay | Admitting: Thoracic Surgery (Cardiothoracic Vascular Surgery)

## 2021-12-21 DIAGNOSIS — R911 Solitary pulmonary nodule: Secondary | ICD-10-CM

## 2022-01-31 ENCOUNTER — Other Ambulatory Visit: Payer: Self-pay | Admitting: Thoracic Surgery (Cardiothoracic Vascular Surgery)

## 2022-01-31 ENCOUNTER — Ambulatory Visit
Admission: RE | Admit: 2022-01-31 | Discharge: 2022-01-31 | Disposition: A | Discharge status: Home / Self Care | Payer: Medicare HMO | Source: Ambulatory Visit | Attending: Thoracic Surgery (Cardiothoracic Vascular Surgery) | Admitting: Thoracic Surgery (Cardiothoracic Vascular Surgery)

## 2022-01-31 ENCOUNTER — Ambulatory Visit: Payer: Medicare HMO | Admitting: Thoracic Surgery (Cardiothoracic Vascular Surgery)

## 2022-01-31 ENCOUNTER — Encounter: Payer: Self-pay | Admitting: Thoracic Surgery (Cardiothoracic Vascular Surgery)

## 2022-01-31 VITALS — BP 150/67 | HR 66 | Resp 20 | Ht 67.0 in | Wt 147.0 lb

## 2022-01-31 DIAGNOSIS — M899 Disorder of bone, unspecified: Secondary | ICD-10-CM

## 2022-01-31 DIAGNOSIS — R911 Solitary pulmonary nodule: Secondary | ICD-10-CM

## 2022-01-31 NOTE — Progress Notes (Signed)
[address]       [place] [address]             [phone]     HPI: Mr. Buss returns regarding abnormalities on chest CT.  Cecil Vandyke is a 76 year old man with a history of tobacco abuse, lung nodules, hypertension, hyperlipidemia, ASCVD, CABG, reflux, type 2 diabetes without complication, stage III chronic kidney disease, and pneumonia.  In 2017 he was found to have a cavitary lung nodule on a chest x-ray prior to coronary bypass grafting.  A follow-up CT showed multiple small nodules and groundglass opacities bilaterally.  He has been followed with CT since then.    He has been feeling well.  He is not having any cough, wheezing, or shortness of breath.  No chest pain, pressure, or tightness.  No fevers although his wife says that he sometimes has chills.  This happens about once a month and he sometimes has emesis so they have been attributing it to reflux.   Past Medical History:  Diagnosis Date   CAD S/P percutaneous coronary angioplasty 1998   PCI TO CX   CKD (chronic kidney disease) stage 3, GFR 30-59 ml/min (HCC)    Diabetes mellitus without complication ([place])    Diverticulitis 08/16/2017   hospitalized with diverticulitis/sepsis   GERD (gastroesophageal reflux disease)    occassionally   Hx of CABG March 2017   x 5   Hypercholesteremia    Hypertension    Neuropathy    Peripheral vascular disease ([place])    s/p R-L FEM-FEM BYPASS   Pneumonia    Tachycardia    after CABG, pt on medicine for this    Current Outpatient Medications  Medication Sig Dispense Refill   albuterol (VENTOLIN HFA) 108 (90 Base) MCG/ACT inhaler Inhale 1-2 puffs into the lungs every 6 (six) hours as needed for wheezing or shortness of breath.     amLODipine (NORVASC) 10 MG tablet Take 10 mg by mouth in the morning.     aspirin EC 81 MG tablet Take 1 tablet (81 mg total) by mouth daily with breakfast. 30 tablet 2   atorvastatin (LIPITOR) 40 MG tablet Take 1 tablet (40 mg  total) by mouth daily at 6 PM. (Patient taking differently: Take 40 mg by mouth at bedtime.) 30 tablet 1   cilostazol (PLETAL) 100 MG tablet Take 100 mg by mouth 2 (two) times daily.     clopidogrel (PLAVIX) 75 MG tablet Take 75 mg by mouth in the morning.     empagliflozin (JARDIANCE) 25 MG TABS tablet Take 12.5 mg by mouth in the morning.     gabapentin (NEURONTIN) 600 MG tablet Take 1,800 mg by mouth in the morning and at bedtime.     hydrochlorothiazide (HYDRODIURIL) 12.5 MG tablet Take 12.5 mg by mouth in the morning.     insulin glargine (LANTUS) 100 UNIT/ML injection Inject 20 Units into the skin at bedtime.     metFORMIN (GLUCOPHAGE) 1000 MG tablet Take 1,000 mg by mouth 2 (two) times daily with a meal.      metoprolol tartrate (LOPRESSOR) 50 MG tablet Take 25 mg by mouth 2 (two) times daily.      pantoprazole (PROTONIX) 40 MG tablet Take 1 tablet (40 mg total) by mouth daily. 5 tablet 0   Semaglutide,0.25 or 0.'5MG'$ /DOS, (OZEMPIC, 0.25 OR 0.5 MG/DOSE,) 2 MG/1.5ML SOPN Inject 0.5 mg into the skin every Sunday. Sunday night     vitamin B-12 (  CYANOCOBALAMIN) 1000 MCG tablet Take 1,000 mcg by mouth in the morning.     guaiFENesin (MUCINEX) 600 MG 12 hr tablet Take 600 mg by mouth 2 (two) times daily as needed for cough or to loosen phlegm. (Patient not taking: Reported on 01/31/2022)     No current facility-administered medications for this visit.    Physical Exam BP (!) 150/67   Pulse 66   Resp 20   Ht '5\' 7"'$  (1.702 m)   Wt 147 lb (66.7 kg)   SpO2 97% Comment: RA  BMI 23.44 kg/m  76 year old man in no acute distress Alert and oriented x3 with no focal deficits Lungs clear bilaterally Cardiac regular rate and rhythm No edema  Diagnostic Tests: CT CHEST WITHOUT CONTRAST   TECHNIQUE: Multidetector CT imaging of the chest was performed following the standard protocol without IV contrast.   RADIATION DOSE REDUCTION: This exam was performed according to the departmental  dose-optimization program which includes automated exposure control, adjustment of the mA and/or kV according to patient size and/or use of iterative reconstruction technique.   COMPARISON:  Chest CT 11/16/2021, 07/22/2021 and 01/12/2021   FINDINGS: Cardiovascular: Atherosclerosis of the aorta, great vessels and coronary arteries status post median sternotomy and CABG. The heart size is normal. There is no pericardial effusion.   Mediastinum/Nodes: There are no enlarged mediastinal, hilar or axillary lymph nodes.Small mediastinal lymph nodes are stable. Hilar assessment is limited by the lack of intravenous contrast, although the hilar contours appear unchanged. The thyroid gland, trachea and esophagus demonstrate no significant findings.   Lungs/Pleura: No pleural effusion or pneumothorax. Fluctuating chronic lung disease with patchy peribronchovascular ground-glass opacities, architectural distortion and postinflammatory scarring (greatest in the left upper lobe. Compared with the most recent study, there has been partial clearing of the right middle lobe, with slight worsening of ground-glass opacities in the right upper lobe. No suspicious pulmonary nodules.   Upper abdomen: Small gallstones. Nonobstructing bilateral renal calculi.   Musculoskeletal/Chest wall: There is no chest wall mass or suspicious osseous finding. Previous median sternotomy.   IMPRESSION: 1. Fluctuating chronic lung disease with patchy peribronchovascular ground-glass opacities, architectural distortion and scattered scarring as described. Compared with the most recent examination, the right upper lobe involvement has mildly progressed, but the middle lobe involvement has improved. Findings suggest a chronic inflammatory or atypical infectious etiology. 2. No suspicious pulmonary nodules. 3. No adenopathy or pleural effusion. 4. Coronary and Aortic Atherosclerosis (ICD10-I70.0). Previous CABG. 5.  Cholelithiasis and nonobstructing bilateral renal calculi.     Electronically Signed   By: Richardean Sale M.D.   On: 01/31/2022 16:05 I personally reviewed the CT images.  There are patchy peribronchovascular opacities throughout the lung.  No suspicious nodules.  Impression: Dylon Correa is a 76 year old man with a history of tobacco abuse, lung nodules, hypertension, hyperlipidemia, ASCVD, CABG, reflux, type 2 diabetes without complication, stage III chronic kidney disease, and pneumonia.  Chronic lung disease-history of tobacco abuse.  No dominant nodules.  Has diffuse opacities bilaterally right greater than left.  Upper lobe predominant.  Unclear etiology.  Remains asymptomatic.  We will check QuantiFERON and fungal antibody panels to see if that provides any clues as to a etiology.  We will plan to get another CT in 6 months.  May need to consider bronchoscopy at some point for cultures.  Remains asymptomatic so no pressing need for invasive procedure currently.  CAD-no anginal symptoms currently  Plan: We will check fungal antibody panel and QuantiFERON Plan to  see him back in 6 months with a repeat CT.  I spent over 20 minutes in review of records, images, and in consultation with Mr. Gronau today. [name], MD [facility] [phone]

## 2022-02-04 LAB — QUANTIFERON-TB GOLD PLUS
QuantiFERON Mitogen Value: >10 IU/mL
QuantiFERON Nil Value: 0.01 IU/mL
QuantiFERON TB1 Ag Value: 0.02 IU/mL
QuantiFERON TB2 Ag Value: 0.03 IU/mL
QuantiFERON-TB Gold Plus: NEGATIVE

## 2022-02-21 ENCOUNTER — Ambulatory Visit (INDEPENDENT_AMBULATORY_CARE_PROVIDER_SITE_OTHER): Payer: Medicare HMO | Admitting: Vascular Surgery

## 2022-02-21 ENCOUNTER — Encounter: Payer: Self-pay | Admitting: Vascular Surgery

## 2022-02-21 ENCOUNTER — Ambulatory Visit (HOSPITAL_COMMUNITY)
Admission: RE | Admit: 2022-02-21 | Discharge: 2022-02-21 | Disposition: A | Discharge status: Home / Self Care | Payer: Medicare HMO | Source: Ambulatory Visit | Attending: Vascular Surgery | Admitting: Vascular Surgery

## 2022-02-21 ENCOUNTER — Ambulatory Visit (INDEPENDENT_AMBULATORY_CARE_PROVIDER_SITE_OTHER)
Admission: RE | Admit: 2022-02-21 | Discharge: 2022-02-21 | Disposition: A | Discharge status: Home / Self Care | Payer: Medicare HMO | Source: Ambulatory Visit | Attending: Vascular Surgery | Admitting: Vascular Surgery

## 2022-02-21 ENCOUNTER — Other Ambulatory Visit: Payer: Self-pay

## 2022-02-21 VITALS — BP 167/74 | HR 75 | Temp 98.1°F | Resp 18 | Ht 68.0 in | Wt 145.0 lb

## 2022-02-21 DIAGNOSIS — I739 Peripheral vascular disease, unspecified: Secondary | ICD-10-CM

## 2022-02-21 DIAGNOSIS — I714 Abdominal aortic aneurysm, without rupture, unspecified: Secondary | ICD-10-CM

## 2022-02-21 NOTE — Progress Notes (Signed)
Patient name: [name] MRN: [id number] DOB: 08-31-1945 Sex: male  REASON FOR CONSULT: 19-monthfollow-up for surveillance of right iliac stents  HPI: Anthony NGUYENis a 76y.o. male, with history of coronary artery disease, diabetes, hypertension, hyperlipidemia, history of femoral-femoral bypass that presents for 370-monthollow-up of his right iliac stents.  Today he states his legs are doing great.  Not having any lower extremity complaints.  Much better since his intervention earlier this year.  Not smoking.  Taking his aspirin Plavix statin.  He had a femorofemoral bypass with Dr. HaAmedeo Plentyears ago.  As recently as 08/10/2016 he had a redo right femoral artery exposure with iliofemoral endarterectomy bovine patch and redo right limb of his femorofemoral bypass by Dr. BrTrula Slade On 09/19/2016 he then had ultrasound-guided access of left brachial artery with angioplasty of the right common femoral artery, stent of the right external iliac, and stent of the right common femoral artery also by Dr. BrTrula Slade I then performed right common iliac artery angioplasty and stenting with a 7 x [id number] as well as a drug-coated balloon angioplasty of the right external iliac artery on 09/22/21 for a recurrent high grade stenosis.   Past Medical History:  Diagnosis Date   CAD S/P percutaneous coronary angioplasty 1998   PCI TO CX   CKD (chronic kidney disease) stage 3, GFR 30-59 ml/min (HCC)    [place]   Diverticulitis 08/16/2017   hospitalized with diverticulitis/sepsis   GERD (gastroesophageal reflux disease)    occassionally   Hx of CABG March 2017   x 5   Hypercholesteremia    Hypertension    Neuropathy    [place]   s/p R-L FEM-FEM BYPASS   Pneumonia    Tachycardia    after CABG, pt on medicine for this    Past Surgical History:  Procedure Laterality Date   ABDOMINAL AORTOGRAM Bilateral 09/19/2016   Procedure: iliac;  Surgeon:  VaSerafina MitchellMD;  [facility];  Service: Cardiovascular;  Laterality: Bilateral;   ABDOMINAL AORTOGRAM W/LOWER EXTREMITY N/A 09/22/2021   Procedure: ABDOMINAL AORTOGRAM W/LOWER EXTREMITY;  Surgeon: ClMarty HeckMD;  [facility];  Service: Cardiovascular;  Laterality: N/A;   CARDIAC CATHETERIZATION  2003   with stent   CARDIAC CATHETERIZATION N/A 10/11/2015   Procedure: Left Heart Cath and Coronary Angiography;  Surgeon: Peter M JoMartiniqueMD;  [facility];  Service: Cardiovascular;  Laterality: N/A;   COLON RESECTION N/A 12/13/2017   Procedure: EXPLORATORY LAPAROTOMY, SIGMOID COLECTOMY WITH COLOSTOMY;  Surgeon: TsDonnie MesaMD;  [facility] Service: General;  Laterality: N/A;   COLONOSCOPY N/A 09/22/2013   Procedure: COLONOSCOPY;  Surgeon: RoDaneil DolinMD;  [facility];  Service: Endoscopy;  Laterality: N/A;  9:30 AM   COLONOSCOPY N/A 02/19/2018   Procedure: COLONOSCOPY;  Surgeon: RoDaneil DolinMD;  [facility];  Service: Endoscopy;  Laterality: N/A;  2:00pm   COLOSTOMY N/A 12/13/2017   Procedure: COLOSTOMY;  Surgeon: TsDonnie MesaMD;  [facility] Service: General;  Laterality: N/A;   COLOSTOMY REVERSAL N/A 03/26/2018   Procedure: COLOSTOMY REVERSAL;  Surgeon: TsDonnie MesaMD;  [facility] Service: General;  Laterality: N/A;   CORONARY ARTERY BYPASS GRAFT N/A 10/18/2015   Procedure: CORONARY ARTERY BYPASS GRAFTING (CABG) x  five, using left internal mammary artery and right  leg greater saphenous vein harvested endoscopically;  Surgeon: [name], MD;  Location: [place];  Service: Open Heart Surgery;  Laterality: N/A;   ENDARTERECTOMY FEMORAL Right 08/09/2016   Procedure: ENDARTERECTOMY FEMORAL WITH VEIN PATCH ANGIOPLASTY;  Surgeon: Serafina Mitchell, MD;  [facility];  Service: Vascular;  Laterality: Right;   ESOPHAGOGASTRODUODENOSCOPY N/A 10/24/2017   Dr. Gala Romney: hiatal hernia    FEMORAL-FEMORAL BYPASS GRAFT Bilateral 08/09/2016   Procedure: REVISION BYPASS GRAFT RIGHT FEMORAL-LEFT FEMORAL ARTERY;  Surgeon: Serafina Mitchell, MD;  Location: [place];  Service: Vascular;  Laterality: Bilateral;   FEMORAL-POPLITEAL BYPASS GRAFT     INTRAMEDULLARY (IM) NAIL INTERTROCHANTERIC Left 04/03/2021   Procedure: LEFT  HIP INTRAMEDULLARY (IM) NAIL INTERTROCHANTRIC;  Surgeon: Mcarthur Rossetti, MD;  Location: [place];  Service: Orthopedics;  Laterality: Left;   PERIPHERAL VASCULAR BALLOON ANGIOPLASTY Right 09/22/2021   Procedure: PERIPHERAL VASCULAR BALLOON ANGIOPLASTY;  Surgeon: [name], MD;  Location: [facility];  Service: Cardiovascular;  Laterality: Right;   PERIPHERAL VASCULAR CATHETERIZATION N/A 05/08/2016   Procedure: Lower Extremity Angiography;  Surgeon: Lorretta Harp, MD;  Location: [facility] [facility];  Service: Cardiovascular;  Laterality: N/A;   PERIPHERAL VASCULAR INTERVENTION Right 09/19/2016   Procedure: Peripheral Vascular Intervention;  Surgeon: Serafina Mitchell, MD;  Location: [place] [facility];  Service: Cardiovascular;  Laterality: Right;  ext iliac stent   PERIPHERAL VASCULAR INTERVENTION Right 09/22/2021   Procedure: PERIPHERAL VASCULAR INTERVENTION;  Surgeon: [name], MD;  Location: [place] [facility];  Service: Cardiovascular;  Laterality: Right;   POLYPECTOMY  02/19/2018   Procedure: POLYPECTOMY;  Surgeon: Daneil Dolin, MD;  [facility];  Service: Endoscopy;;   PROCTOSCOPY  10/24/2017   Procedure: PROCTOSCOPY;  Surgeon: Daneil Dolin, MD;  [facility];  Service: Endoscopy;;   TEE WITHOUT CARDIOVERSION N/A 10/18/2015   Procedure: TRANSESOPHAGEAL ECHOCARDIOGRAM (TEE);  Surgeon: [name], MD;  Location: [place];  Service: Open Heart Surgery;  Laterality: N/A;    Family History  Problem Relation Age of Onset   Heart attack Mother    Stroke Mother    Colon cancer Brother        late 81s    SOCIAL  HISTORY: Social History   Socioeconomic History   Marital status: Married    Spouse name: Not on file   Number of children: Not on file   Years of education: Not on file   Highest education level: Not on file  Occupational History   Not on file  Tobacco Use   Smoking status: Former    Packs/day: 2.00    Years: 30.00    Total pack years: 60.00    Types: Cigarettes    Quit date: 08/14/2000    Years since quitting: 21.5   Smokeless tobacco: Never  Vaping Use   Vaping Use: Never used  Substance and Sexual Activity   Alcohol use: No   Drug use: No   Sexual activity: Not on file  Other Topics Concern   Not on file  Social History Narrative   Not on file   Social Determinants of Health   Financial Resource Strain: Not on file  Food Insecurity: Not on file  Transportation Needs: Not on file  Physical Activity: Not on file  Stress: Not on file  Social Connections: Not on file  Intimate Partner Violence: Not on file    No Known Allergies  Current Outpatient Medications  Medication  Sig Dispense Refill   albuterol (VENTOLIN HFA) 108 (90 Base) MCG/ACT inhaler Inhale 1-2 puffs into the lungs every 6 (six) hours as needed for wheezing or shortness of breath.     amLODipine (NORVASC) 10 MG tablet Take 10 mg by mouth in the morning.     aspirin EC 81 MG tablet Take 1 tablet (81 mg total) by mouth daily with breakfast. 30 tablet 2   atorvastatin (LIPITOR) 40 MG tablet Take 1 tablet (40 mg total) by mouth daily at 6 PM. (Patient taking differently: Take 40 mg by mouth at bedtime.) 30 tablet 1   cilostazol (PLETAL) 100 MG tablet Take 100 mg by mouth 2 (two) times daily.     clopidogrel (PLAVIX) 75 MG tablet Take 75 mg by mouth in the morning.     empagliflozin (JARDIANCE) 25 MG TABS tablet Take 12.5 mg by mouth in the morning.     gabapentin (NEURONTIN) 600 MG tablet Take 1,800 mg by mouth in the morning and at bedtime.     hydrochlorothiazide (HYDRODIURIL) 12.5 MG tablet Take 12.5 mg  by mouth in the morning.     insulin glargine (LANTUS) 100 UNIT/ML injection Inject 20 Units into the skin at bedtime.     metFORMIN (GLUCOPHAGE) 1000 MG tablet Take 1,000 mg by mouth 2 (two) times daily with a meal.      metoprolol tartrate (LOPRESSOR) 50 MG tablet Take 25 mg by mouth 2 (two) times daily.      pantoprazole (PROTONIX) 40 MG tablet Take 1 tablet (40 mg total) by mouth daily. 5 tablet 0   Semaglutide,0.25 or 0.'5MG'$ /DOS, (OZEMPIC, 0.25 OR 0.5 MG/DOSE,) 2 MG/1.5ML SOPN Inject 0.5 mg into the skin every Sunday. Sunday night     vitamin B-12 (CYANOCOBALAMIN) 1000 MCG tablet Take 1,000 mcg by mouth in the morning.     guaiFENesin (MUCINEX) 600 MG 12 hr tablet Take 600 mg by mouth 2 (two) times daily as needed for cough or to loosen phlegm. (Patient not taking: Reported on 01/31/2022)     No current facility-administered medications for this visit.    REVIEW OF SYSTEMS:  '[X]'$  denotes positive finding, '[ ]'$  denotes negative finding Cardiac  Comments:  Chest pain or chest pressure:    Shortness of breath upon exertion:    Short of breath when lying flat:    Irregular heart rhythm:        Vascular    Pain in calf, thigh, or hip brought on by ambulation:    Pain in feet at night that wakes you up from your sleep:     Blood clot in your veins:    Leg swelling:         Pulmonary    Oxygen at home:    Productive cough:     Wheezing:         Neurologic    Sudden weakness in arms or legs:     Sudden numbness in arms or legs:     Sudden onset of difficulty speaking or slurred speech:    Temporary loss of vision in one eye:     Problems with dizziness:         Gastrointestinal    Blood in stool:     Vomited blood:         Genitourinary    Burning when urinating:     Blood in urine:        Psychiatric    Major depression:  Hematologic    Bleeding problems:    Problems with blood clotting too easily:        Skin    Rashes or ulcers:        Constitutional     Fever or chills:      PHYSICAL EXAM: Vitals:   02/21/22 0848  BP: (!) 167/74  Pulse: 75  Resp: 18  Temp: 98.1 F (36.7 C)  TempSrc: Temporal  SpO2: 98%  Weight: 145 lb (65.8 kg)  Height: '5\' 8"'$  (1.727 m)    GENERAL: The patient is a well-nourished male, in no acute distress. The vital signs are documented above. CARDIAC: There is a regular rate and rhythm.  VASCULAR:  Palpable femoral pulse bilaterally Palpable pulse in femorofemoral bypass No palpable pedal pulses No lower extremity tissue loss PULMONARY: No respiratory distress. ABDOMEN: Soft and non-tender. MUSCULOSKELETAL: There are no major deformities or cyanosis. NEUROLOGIC: No focal weakness or paresthesias are detected. SKIN: There are no ulcers or rashes noted. PSYCHIATRIC: The patient has a normal affect.  DATA:   Aortoiliac duplex shows a right common iliac velocity of 315 compared to 337 approximately 3 months ago with biphasic flow throughout right iliac artery system.  ABIs 0.75 on the right and monophasic on the left noncompressible   Assessment/Plan:  76 yo M that had a femorofemoral bypass with Dr. Amedeo Plenty years ago.  As recently as 08/10/2016 he had a redo right femoral artery exposure with iliofemoral endarterectomy bovine patch and redo right limb of his femorofemoral bypass by Dr. Trula Slade.  On 09/19/2016 he then had ultrasound-guided access of left brachial artery with angioplasty of the right common femoral artery, stent of the right external iliac, and stent of the right common femoral artery also by Dr. Trula Slade.  I then performed right common iliac artery angioplasty and stenting with a 7 x 20 VBX as well as a drug-coated balloon angioplasty of the right external iliac artery on 09/22/21 for a recurrent high grade stenosis.  He presents today for 60-monthfollow-up as we are watching a stenosis in his right common iliac stent.  Discussed that the velocities are stable over the last 3 months.  Again he has  a very good pulse in the femorofemoral bypass as well as both groins.  He is having no lower extremity complaints.  States his legs are much better since intervention earlier this year.  I am comfortable continuing to observe this at this time.  I will see him in 3 months as were going to do close interval surveillance with right aortoiliac duplex, femorofemoral duplex and ABIs.   CMarty Heck MD [facility]onOffice: [phone]

## 2022-03-07 ENCOUNTER — Encounter: Payer: Self-pay | Admitting: Internal Medicine

## 2022-03-28 ENCOUNTER — Encounter: Payer: Self-pay | Admitting: Internal Medicine

## 2022-03-28 ENCOUNTER — Ambulatory Visit (INDEPENDENT_AMBULATORY_CARE_PROVIDER_SITE_OTHER): Payer: No Typology Code available for payment source | Admitting: Internal Medicine

## 2022-03-28 VITALS — BP 169/69 | HR 71 | Temp 98.2°F | Ht 67.0 in | Wt 148.4 lb

## 2022-03-28 DIAGNOSIS — R197 Diarrhea, unspecified: Secondary | ICD-10-CM

## 2022-03-28 DIAGNOSIS — K219 Gastro-esophageal reflux disease without esophagitis: Secondary | ICD-10-CM

## 2022-03-28 NOTE — Patient Instructions (Signed)
It was nice to see you again today!  You likely suffered a foodborne illness which caused your episode of diarrhea.  I am glad it seems to have resolved.  Hopefully, you will not have any similar problems going forward.  Given your history of colonic polyp removed in 2019, I recommend you have a repeat colonoscopy next year (2024)  As far as your acid reflux/heartburn is concerned, you would benefit from a higher dose of Protonix.  We will call in a new prescription for Protonix or pantoprazole 40 mg tablet-take 30 minutes before breakfast every day.  Dispense 90 with 3 refills.  GERD information provided  Plan to see you back in 6 months.  Should you have any interim problems please do not hesitate to give me a call.

## 2022-03-28 NOTE — Progress Notes (Signed)
Primary Care Physician:  [name], MD Primary Gastroenterologist:  Dr. Daiva Nakayama  Pre-Procedure History & Physical: HPI:  [name] is a 76 y.o.'s [employer] veteran male here for further evaluation of nonbloody diarrhea.  Patient stated he was in his usual state of good health until about 3 months ago when he started having multiple episodes of nonbloody watery diarrhea day and night lasted several weeks.  He had occasional bouts of incontinence.  He saw providers at the [facility] in [place].  Providers there sent off a stool sample for C. difficile cryptosporidium and Giardia testing-all came back negative.   He tells me about 3 weeks ago his diarrhea resolved.  He is reverted back to his baseline of 1 formed ([place] 4 stool) daily to every other day.  He has not passed any blood.  He feels well.  Incidentally, he denies unusual travel or any sick contacts in the past few months. Also, of note patient is diabetic and is on multiple medications.  He has been on metformin for years. GI history significant complicated diverticulitis requiring's diverting colostomy previously I saw him back in 2019 prior to takedown.  He underwent: Colonoscopy through his colostomy and proctoscopy yielding only a small adenoma which was removed; slated for surveillance colonoscopy 2024.  Note he has longstanding gastroesophageal reflux disease that manifest as heartburn.  He has breakthrough symptoms frequently on pantoprazole 20 mg daily.  No dysphagia.  EGD for reflux here in 2019 revealed only a small hiatal hernia. 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   x 5   Hypercholesteremia     Hypertension    Neuropathy    Peripheral vascular disease ([place])    s/p R-L FEM-FEM BYPASS   Pneumonia    Tachycardia    after CABG, pt on medicine for this    Past Surgical History:  Procedure Laterality Date   ABDOMINAL AORTOGRAM Bilateral 09/19/2016   Procedure: iliac;  Surgeon: Serafina Mitchell, MD;  Location: [place] [facility];  Service: Cardiovascular;  Laterality: Bilateral;   ABDOMINAL AORTOGRAM W/LOWER EXTREMITY N/A 09/22/2021   Procedure: ABDOMINAL AORTOGRAM W/LOWER EXTREMITY;  Surgeon: [name], MD;  Location: [place] [facility];  Service: Cardiovascular;  Laterality: N/A;   CARDIAC CATHETERIZATION  2003   with stent   CARDIAC CATHETERIZATION N/A 10/11/2015   Procedure: Left Heart Cath and Coronary Angiography;  Surgeon: Peter M Martinique, MD;  Location: [place] [facility];  Service: Cardiovascular;  Laterality: N/A;   COLON RESECTION N/A 12/13/2017   Procedure: EXPLORATORY LAPAROTOMY, SIGMOID COLECTOMY WITH COLOSTOMY;  Surgeon: Donnie Mesa, MD;  Location: [place];  Service: General;  Laterality: N/A;   COLONOSCOPY N/A 09/22/2013   Procedure: COLONOSCOPY;  Surgeon: Daneil Dolin, MD;  [facility];  Service: Endoscopy;  Laterality: N/A;  9:30 AM   COLONOSCOPY N/A 02/19/2018   Procedure: COLONOSCOPY;  Surgeon: Daneil Dolin, MD;  [facility];  Service: Endoscopy;  Laterality: N/A;  2:00pm   COLOSTOMY N/A 12/13/2017   Procedure: COLOSTOMY;  Surgeon: Donnie Mesa, MD;  Location: [place];  Service: General;  Laterality: N/A;  COLOSTOMY REVERSAL N/A 03/26/2018   Procedure: COLOSTOMY REVERSAL;  Surgeon: Donnie Mesa, MD;  Location: [place];  Service: General;  Laterality: N/A;   CORONARY ARTERY BYPASS GRAFT N/A 10/18/2015   Procedure: CORONARY ARTERY BYPASS GRAFTING (CABG) x  five, using left internal mammary artery and right leg greater saphenous vein harvested endoscopically;  Surgeon: [name], MD;  Location: [place];  Service: Open Heart Surgery;   Laterality: N/A;   ENDARTERECTOMY FEMORAL Right 08/09/2016   Procedure: ENDARTERECTOMY FEMORAL WITH VEIN PATCH ANGIOPLASTY;  Surgeon: Serafina Mitchell, MD;  [facility];  Service: Vascular;  Laterality: Right;   ESOPHAGOGASTRODUODENOSCOPY N/A 10/24/2017   Dr. Gala Romney: hiatal hernia   FEMORAL-FEMORAL BYPASS GRAFT Bilateral 08/09/2016   Procedure: REVISION BYPASS GRAFT RIGHT FEMORAL-LEFT FEMORAL ARTERY;  Surgeon: Serafina Mitchell, MD;  Location: [place];  Service: Vascular;  Laterality: Bilateral;   FEMORAL-POPLITEAL BYPASS GRAFT     INTRAMEDULLARY (IM) NAIL INTERTROCHANTERIC Left 04/03/2021   Procedure: LEFT  HIP INTRAMEDULLARY (IM) NAIL INTERTROCHANTRIC;  Surgeon: Mcarthur Rossetti, MD;  Location: [place];  Service: Orthopedics;  Laterality: Left;   PERIPHERAL VASCULAR BALLOON ANGIOPLASTY Right 09/22/2021   Procedure: PERIPHERAL VASCULAR BALLOON ANGIOPLASTY;  Surgeon: [name], MD;  Location: [place] [facility];  Service: Cardiovascular;  Laterality: Right;   PERIPHERAL VASCULAR CATHETERIZATION N/A 05/08/2016   Procedure: Lower Extremity Angiography;  Surgeon: Lorretta Harp, MD;  Location: [place] [facility];  Service: Cardiovascular;  Laterality: N/A;   PERIPHERAL VASCULAR INTERVENTION Right 09/19/2016   Procedure: Peripheral Vascular Intervention;  Surgeon: Serafina Mitchell, MD;  Location: [place] [facility];  Service: Cardiovascular;  Laterality: Right;  ext iliac stent   PERIPHERAL VASCULAR INTERVENTION Right 09/22/2021   Procedure: PERIPHERAL VASCULAR INTERVENTION;  Surgeon: [name], MD;  Location: [place] [facility];  Service: Cardiovascular;  Laterality: Right;   POLYPECTOMY  02/19/2018   Procedure: POLYPECTOMY;  Surgeon: Daneil Dolin, MD;  [facility];  Service: Endoscopy;;   PROCTOSCOPY  10/24/2017   Procedure: PROCTOSCOPY;  Surgeon: Daneil Dolin, MD;  [facility];  Service: Endoscopy;;   TEE WITHOUT CARDIOVERSION N/A 10/18/2015    Procedure: TRANSESOPHAGEAL ECHOCARDIOGRAM (TEE);  Surgeon: [name], MD;  Location: [place];  Service: Open Heart Surgery;  Laterality: N/A;    Prior to Admission medications   Medication Sig Start Date End Date Taking? Authorizing Provider  albuterol (VENTOLIN HFA) 108 (90 Base) MCG/ACT inhaler Inhale 1-2 puffs into the lungs every 6 (six) hours as needed for wheezing or shortness of breath.   Yes [provider]  amLODipine (NORVASC) 10 MG tablet Take 10 mg by mouth in the morning.   Yes [provider]  aspirin EC 81 MG tablet Take 1 tablet (81 mg total) by mouth daily with breakfast. 05/23/19  Yes Emokpae, Courage, MD  atorvastatin (LIPITOR) 40 MG tablet Take 1 tablet (40 mg total) by mouth daily at 6 PM. Patient taking differently: Take 40 mg by mouth at bedtime. 09/16/18  Yes Tat, Shanon Brow, MD  cilostazol (PLETAL) 100 MG tablet Take 100 mg by mouth 2 (two) times daily.   Yes [provider]  clopidogrel (PLAVIX) 75 MG tablet Take 75 mg by mouth in the morning.   Yes [provider]  empagliflozin (JARDIANCE) 25 MG TABS tablet Take 12.5 mg by mouth in the morning.   Yes [provider]  gabapentin (NEURONTIN) 600 MG tablet Take 1,800 mg by mouth  in the morning and at bedtime.   Yes [provider]  hydrochlorothiazide (HYDRODIURIL) 12.5 MG tablet Take 12.5 mg by mouth in the morning.   Yes [provider]  insulin glargine (LANTUS) 100 UNIT/ML injection Inject 20 Units into the skin at bedtime.   Yes [provider]  losartan (COZAAR) 50 MG tablet Take 50 mg by mouth daily. 03/02/22  Yes [provider]  metFORMIN (GLUCOPHAGE) 1000 MG tablet Take 1,000 mg by mouth 2 (two) times daily with a meal.    Yes [provider]  metoprolol tartrate (LOPRESSOR) 50 MG tablet Take 25 mg by mouth 2 (two) times daily.    Yes [provider]  pantoprazole (PROTONIX) 40 MG tablet Take 1 tablet (40 mg total)  by mouth daily. 08/19/17  Yes Dhungel, Nishant, MD  Semaglutide,0.25 or 0.'5MG'$ /DOS, (OZEMPIC, 0.25 OR 0.5 MG/DOSE,) 2 MG/1.5ML SOPN Inject 0.5 mg into the skin every Sunday. Sunday night   Yes [provider]  vitamin B-12 (CYANOCOBALAMIN) 1000 MCG tablet Take 1,000 mcg by mouth in the morning.   Yes [provider]    Allergies as of 03/28/2022   (No Known Allergies)    Family History  Problem Relation Age of Onset   Heart attack Mother    Stroke Mother    Colon cancer Brother        late 96s    Social History   Socioeconomic History   Marital status: Married    Spouse name: Not on file   Number of children: Not on file   Years of education: Not on file   Highest education level: Not on file  Occupational History   Not on file  Tobacco Use   Smoking status: Former    Packs/day: 2.00    Years: 30.00    Total pack years: 60.00    Types: Cigarettes    Quit date: 08/14/2000    Years since quitting: 21.6   Smokeless tobacco: Never  Vaping Use   Vaping Use: Never used  Substance and Sexual Activity   Alcohol use: No   Drug use: No   Sexual activity: Not on file  Other Topics Concern   Not on file  Social History Narrative   Not on file   Social Determinants of Health   Financial Resource Strain: Not on file  Food Insecurity: Not on file  Transportation Needs: Not on file  Physical Activity: Not on file  Stress: Not on file  Social Connections: Not on file  Intimate Partner Violence: Not on file    Review of Systems: See HPI, otherwise negative ROS  Physical Exam: BP (!) 169/69 (BP Location: Left Arm, Patient Position: Sitting, Cuff Size: Normal)   Pulse 71   Temp 98.2 F (36.8 C) (Temporal)   Ht '5\' 7"'$  (1.702 m)   Wt 148 lb 6.4 oz (67.3 kg)   SpO2 97%   BMI 23.24 kg/m  General:   Alert,  Well-developed, well-nourished, pleasant and cooperative in NAD Lungs:  Clear throughout to auscultation.   No wheezes, crackles, or rhonchi. No acute  distress. Heart:  Regular rate and rhythm; no murmurs, clicks, rubs,  or gallops. Abdomen: Nondistended.  Well-healed surgical scars.  Positive bowel sounds soft entirely nontender.  Impression/Plan: 76 year old gentleman referred here for protracted episode of watery diarrhea which occurred a couple of months ago and abated rather acutely 3 weeks ago.  For the past 3 weeks patient endorses back to baseline bowel function.  I  suspect patient acquired an enteric infection to account for his diarrhea.  Limited stool studies as outlined above were negative.  GERD poorly controlled.  Reportedly he is on pantoprazole 20 mg each morning.  He would likely benefit from escalating the dose to 40 mg daily.  No alarm features.  Recommendations:  You likely suffered a foodborne illness which caused your episode of diarrhea.  I am glad it seems to have resolved.  Hopefully, you will not have any similar problems going forward.  Given your history of colonic polyp removed in 2019, I recommend you have a repeat colonoscopy next year (2024)  As far as your acid reflux/heartburn is concerned, you would benefit from a higher dose of Protonix.  We will call in a new prescription for Protonix or pantoprazole 40 mg tablet-take 30 minutes before breakfast every day.  Dispense 90 with 3 refills.  GERD information provided  Plan to see you back in 6 months.  Should you have any interim problems please do not hesitate to give me a call.   Notice: This dictation was prepared with Dragon dictation along with smaller phrase technology. Any transcriptional errors that result from this process are unintentional and may not be corrected upon review.

## 2022-03-29 ENCOUNTER — Telehealth: Payer: Self-pay

## 2022-03-29 ENCOUNTER — Other Ambulatory Visit: Payer: Self-pay

## 2022-03-29 MED ORDER — PANTOPRAZOLE SODIUM 40 MG PO TBEC: 40.0000 mg | DELAYED_RELEASE_TABLET | Freq: Every day | ORAL | 3 refills | Status: DC

## 2022-03-29 NOTE — Addendum Note (Signed)
Addended by: Orland Jarred on: 03/29/2022 09:13 AM   Modules accepted: Orders

## 2022-03-29 NOTE — Telephone Encounter (Signed)
Spoke with [place] and the patient is actually taking omeprazole 20 mg once daily. Do you want to proceed with the pantoprazole 40 mg once daily as originally planned?

## 2022-03-29 NOTE — Telephone Encounter (Signed)
Rx has been sent in and pt is aware.

## 2022-05-30 ENCOUNTER — Encounter: Payer: Self-pay | Admitting: Vascular Surgery

## 2022-05-30 ENCOUNTER — Ambulatory Visit (INDEPENDENT_AMBULATORY_CARE_PROVIDER_SITE_OTHER): Payer: No Typology Code available for payment source | Admitting: Vascular Surgery

## 2022-05-30 ENCOUNTER — Ambulatory Visit (INDEPENDENT_AMBULATORY_CARE_PROVIDER_SITE_OTHER)
Admission: RE | Admit: 2022-05-30 | Discharge: 2022-05-30 | Disposition: A | Discharge status: Home / Self Care | Payer: Medicare HMO | Source: Ambulatory Visit | Attending: Vascular Surgery | Admitting: Vascular Surgery

## 2022-05-30 ENCOUNTER — Ambulatory Visit (HOSPITAL_COMMUNITY)
Admission: RE | Admit: 2022-05-30 | Discharge: 2022-05-30 | Disposition: A | Discharge status: Home / Self Care | Payer: Medicare HMO | Source: Ambulatory Visit | Attending: Vascular Surgery | Admitting: Vascular Surgery

## 2022-05-30 VITALS — BP 160/68 | HR 66 | Temp 98.0°F | Resp 16 | Ht 67.0 in | Wt 149.0 lb

## 2022-05-30 DIAGNOSIS — I739 Peripheral vascular disease, unspecified: Secondary | ICD-10-CM

## 2022-05-30 DIAGNOSIS — I350 Nonrheumatic aortic (valve) stenosis: Secondary | ICD-10-CM | POA: Insufficient documentation

## 2022-05-30 NOTE — Progress Notes (Signed)
Patient name: [name] MRN: [id number] DOB: Aug 28, 1945 Sex: male  REASON FOR CONSULT: 45-monthfollow-up for surveillance of right iliac stents  HPI: [name] a 76y.o. male, with history of coronary artery disease, diabetes, hypertension, hyperlipidemia, history of femoral-femoral bypass that presents for 351-monthollow-up of his right iliac stents.  Today he states his legs are doing great.  Not having any lower extremity complaints. Not smoking.  Taking his aspirin Plavix statin.  He had a femorofemoral bypass with Dr. HaAmedeo Plentyears ago.  As recently as 08/10/2016 he had a redo right femoral artery exposure with iliofemoral endarterectomy bovine patch and redo right limb of his femorofemoral bypass by Dr. BrTrula Slade On 09/19/2016 he then had ultrasound-guided access of left brachial artery with angioplasty of the right common femoral artery, stent of the right external iliac, and stent of the right common femoral artery also by Dr. BrTrula Slade I then performed right common iliac artery angioplasty and stenting with a 7 x [id number] as well as a drug-coated balloon angioplasty of the right external iliac artery on 09/22/21 for a recurrent high grade stenosis.    We have been monitoring a recurrent stenosis in the iliac stents.  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   x 5   Hypercholesteremia    Hypertension    Neuropathy    Peripheral vascular disease ([place]   s/p R-L FEM-FEM BYPASS   Pneumonia    Tachycardia    after CABG, pt on medicine for this    Past Surgical History:  Procedure Laterality Date   ABDOMINAL AORTOGRAM Bilateral 09/19/2016   Procedure: iliac;   Surgeon: VaSerafina MitchellMD;  [facility];  Service: Cardiovascular;  Laterality: Bilateral;   ABDOMINAL AORTOGRAM W/LOWER EXTREMITY N/A 09/22/2021   Procedure: ABDOMINAL AORTOGRAM W/LOWER EXTREMITY;  Surgeon: ClMarty HeckMD;  [facility];  Service: Cardiovascular;  Laterality: N/A;   CARDIAC CATHETERIZATION  2003   with stent   CARDIAC CATHETERIZATION N/A 10/11/2015   Procedure: Left Heart Cath and Coronary Angiography;  Surgeon: Peter M JoMartiniqueMD;  [facility];  Service: Cardiovascular;  Laterality: N/A;   COLON RESECTION N/A 12/13/2017   Procedure: EXPLORATORY LAPAROTOMY, SIGMOID COLECTOMY WITH COLOSTOMY;  Surgeon: TsDonnie MesaMD;  [facility]: [facility];  Laterality: N/A;   COLONOSCOPY N/A 09/22/2013   Procedure: COLONOSCOPY;  Surgeon: RoDaneil DolinMD;  [facility];  Service: Endoscopy;  Laterality: N/A;  9:30 AM   COLONOSCOPY N/A 02/19/2018   Procedure: COLONOSCOPY;  Surgeon: RoDaneil DolinMD;  [facility];  Service: Endoscopy;  Laterality: N/A;  2:00pm   COLOSTOMY N/A 12/13/2017   Procedure: COLOSTOMY;  Surgeon: TsDonnie MesaMD;  [facility] Service: General;  Laterality: N/A;   COLOSTOMY REVERSAL N/A 03/26/2018   Procedure: COLOSTOMY REVERSAL;  Surgeon: TsDonnie MesaMD;  [facility] Service: General;  Laterality: N/A;   CORONARY ARTERY BYPASS GRAFT N/A 10/18/2015   Procedure: CORONARY ARTERY BYPASS GRAFTING (CABG) x  five, using left internal mammary  artery and right leg greater saphenous vein harvested endoscopically;  Surgeon: [name], MD;  Location: [place];  Service: Open Heart Surgery;  Laterality: N/A;   ENDARTERECTOMY FEMORAL Right 08/09/2016   Procedure: ENDARTERECTOMY FEMORAL WITH VEIN PATCH ANGIOPLASTY;  Surgeon: Serafina Mitchell, MD;  [facility];  Service: Vascular;  Laterality: Right;   ESOPHAGOGASTRODUODENOSCOPY N/A 10/24/2017   Dr. Gala Romney: hiatal  hernia   FEMORAL-FEMORAL BYPASS GRAFT Bilateral 08/09/2016   Procedure: REVISION BYPASS GRAFT RIGHT FEMORAL-LEFT FEMORAL ARTERY;  Surgeon: Serafina Mitchell, MD;  Location: [place];  Service: Vascular;  Laterality: Bilateral;   FEMORAL-POPLITEAL BYPASS GRAFT     INTRAMEDULLARY (IM) NAIL INTERTROCHANTERIC Left 04/03/2021   Procedure: LEFT  HIP INTRAMEDULLARY (IM) NAIL INTERTROCHANTRIC;  Surgeon: Mcarthur Rossetti, MD;  Location: [place];  Service: Orthopedics;  Laterality: Left;   PERIPHERAL VASCULAR BALLOON ANGIOPLASTY Right 09/22/2021   Procedure: PERIPHERAL VASCULAR BALLOON ANGIOPLASTY;  Surgeon: [name], MD;  Location: [facility] [facility];  Service: Cardiovascular;  Laterality: Right;   PERIPHERAL VASCULAR CATHETERIZATION N/A 05/08/2016   Procedure: Lower Extremity Angiography;  Surgeon: Lorretta Harp, MD;  Location: [place] [facility];  Service: Cardiovascular;  Laterality: N/A;   PERIPHERAL VASCULAR INTERVENTION Right 09/19/2016   Procedure: Peripheral Vascular Intervention;  Surgeon: Serafina Mitchell, MD;  Location: [place] [facility];  Service: Cardiovascular;  Laterality: Right;  ext iliac stent   PERIPHERAL VASCULAR INTERVENTION Right 09/22/2021   Procedure: PERIPHERAL VASCULAR INTERVENTION;  Surgeon: [name], MD;  Location: [place] [facility];  Service: Cardiovascular;  Laterality: Right;   POLYPECTOMY  02/19/2018   Procedure: POLYPECTOMY;  Surgeon: Daneil Dolin, MD;  [facility];  Service: Endoscopy;;   PROCTOSCOPY  10/24/2017   Procedure: PROCTOSCOPY;  Surgeon: Daneil Dolin, MD;  [facility];  Service: Endoscopy;;   TEE WITHOUT CARDIOVERSION N/A 10/18/2015   Procedure: TRANSESOPHAGEAL ECHOCARDIOGRAM (TEE);  Surgeon: [name], MD;  Location: [place];  Service: Open Heart Surgery;  Laterality: N/A;    Family History  Problem Relation Age of Onset   Heart attack Mother    Stroke Mother    Colon cancer Brother        late 23s     SOCIAL HISTORY: Social History   Socioeconomic History   Marital status: Married    Spouse name: Not on file   Number of children: Not on file   Years of education: Not on file   Highest education level: Not on file  Occupational History   Not on file  Tobacco Use   Smoking status: Former    Packs/day: 2.00    Years: 30.00    Total pack years: 60.00    Types: Cigarettes    Quit date: 08/14/2000    Years since quitting: 21.8   Smokeless tobacco: Never  Vaping Use   Vaping Use: Never used  Substance and Sexual Activity   Alcohol use: No   Drug use: No   Sexual activity: Not on file  Other Topics Concern   Not on file  Social History Narrative   Not on file   Social Determinants of Health   Financial Resource Strain: Not on file  Food Insecurity: Not on file  Transportation Needs: Not on file  Physical Activity: Not on file  Stress: Not on file  Social Connections: Not on file  Intimate Partner Violence: Not on file    No Known Allergies  Current Outpatient  Medications  Medication Sig Dispense Refill   albuterol (VENTOLIN HFA) 108 (90 Base) MCG/ACT inhaler Inhale 1-2 puffs into the lungs every 6 (six) hours as needed for wheezing or shortness of breath.     amLODipine (NORVASC) 10 MG tablet Take 10 mg by mouth in the morning.     aspirin EC 81 MG tablet Take 1 tablet (81 mg total) by mouth daily with breakfast. 30 tablet 2   atorvastatin (LIPITOR) 40 MG tablet Take 1 tablet (40 mg total) by mouth daily at 6 PM. (Patient taking differently: Take 40 mg by mouth at bedtime.) 30 tablet 1   cilostazol (PLETAL) 100 MG tablet Take 100 mg by mouth 2 (two) times daily.     clopidogrel (PLAVIX) 75 MG tablet Take 75 mg by mouth in the morning.     empagliflozin (JARDIANCE) 25 MG TABS tablet Take 12.5 mg by mouth in the morning.     gabapentin (NEURONTIN) 600 MG tablet Take 1,800 mg by mouth in the morning and at bedtime.     hydrochlorothiazide (HYDRODIURIL) 12.5 MG tablet  Take 12.5 mg by mouth in the morning.     insulin glargine (LANTUS) 100 UNIT/ML injection Inject 20 Units into the skin at bedtime.     losartan (COZAAR) 50 MG tablet Take 50 mg by mouth daily.     metFORMIN (GLUCOPHAGE) 1000 MG tablet Take 1,000 mg by mouth 2 (two) times daily with a meal.      metoprolol tartrate (LOPRESSOR) 50 MG tablet Take 25 mg by mouth 2 (two) times daily.      pantoprazole (PROTONIX) 40 MG tablet Take 1 tablet (40 mg total) by mouth daily. 90 tablet 3   Semaglutide,0.25 or 0.'5MG'$ /DOS, (OZEMPIC, 0.25 OR 0.5 MG/DOSE,) 2 MG/1.5ML SOPN Inject 0.5 mg into the skin every Sunday. Sunday night     vitamin B-12 (CYANOCOBALAMIN) 1000 MCG tablet Take 1,000 mcg by mouth in the morning.     No current facility-administered medications for this visit.    REVIEW OF SYSTEMS:  '[X]'$  denotes positive finding, '[ ]'$  denotes negative finding Cardiac  Comments:  Chest pain or chest pressure:    Shortness of breath upon exertion:    Short of breath when lying flat:    Irregular heart rhythm:        Vascular    Pain in calf, thigh, or hip brought on by ambulation:    Pain in feet at night that wakes you up from your sleep:     Blood clot in your veins:    Leg swelling:         Pulmonary    Oxygen at home:    Productive cough:     Wheezing:         Neurologic    Sudden weakness in arms or legs:     Sudden numbness in arms or legs:     Sudden onset of difficulty speaking or slurred speech:    Temporary loss of vision in one eye:     Problems with dizziness:         Gastrointestinal    Blood in stool:     Vomited blood:         Genitourinary    Burning when urinating:     Blood in urine:        Psychiatric    Major depression:         Hematologic    Bleeding problems:    Problems with blood clotting  too easily:        Skin    Rashes or ulcers:        Constitutional    Fever or chills:      PHYSICAL EXAM: Vitals:   05/30/22 1019  BP: (!) 160/68  Pulse: 66   Resp: 16  Temp: 98 F (36.7 C)  TempSrc: Temporal  SpO2: 95%  Weight: 149 lb (67.6 kg)  Height: '5\' 7"'$  (1.702 m)    GENERAL: The patient is a well-nourished male, in no acute distress. The vital signs are documented above. CARDIAC: There is a regular rate and rhythm.  VASCULAR:  Palpable femoral pulse bilaterally Palpable pulse in femorofemoral bypass No palpable pedal pulses No lower extremity tissue loss PULMONARY: No respiratory distress. ABDOMEN: Soft and non-tender. MUSCULOSKELETAL: There are no major deformities or cyanosis. NEUROLOGIC: No focal weakness or paresthesias are detected. SKIN: There are no ulcers or rashes noted. PSYCHIATRIC: The patient has a normal affect.  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  There is also concern for a high-grade stenosis in the proximal right to left femorofemoral bypass with a velocity of 461.  Assessment/Plan:  76 yo M that had a femorofemoral bypass with Dr. Amedeo Plenty years ago.  As recently as 08/10/2016 he had a redo right femoral artery exposure with iliofemoral endarterectomy bovine patch and redo right limb of his femorofemoral bypass by Dr. Trula Slade.  On 09/19/2016 he then had ultrasound-guided access of left brachial artery with angioplasty of the right common femoral artery, stent of the right external iliac, and stent of the right common femoral artery also by Dr. Trula Slade.  I then performed right common iliac artery angioplasty and stenting with a 7 x 75 VBX as well as a drug-coated balloon angioplasty of the right external iliac artery on 09/22/21 for a recurrent high grade stenosis.  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  He has a velocity of 537.  There is also concern for high-grade stenosis in the proximal right to left femorofemoral  bypass.  I have recommended a CTA abdomen pelvis to further evaluate what options we have moving forward.  Discussed that disease in his distal abdominal aorta could put his right iliac stents and femorofemoral bypass at risk for failure.  He will follow-up with me after CT scan.   CMarty Heck MD [facility] GPaysonOffice: [phone]

## 2022-06-20 ENCOUNTER — Ambulatory Visit: Payer: No Typology Code available for payment source | Admitting: Vascular Surgery

## 2022-07-05 ENCOUNTER — Other Ambulatory Visit: Payer: Self-pay | Admitting: Thoracic Surgery (Cardiothoracic Vascular Surgery)

## 2022-07-05 DIAGNOSIS — R911 Solitary pulmonary nodule: Secondary | ICD-10-CM

## 2022-07-31 ENCOUNTER — Other Ambulatory Visit: Payer: Self-pay

## 2022-07-31 DIAGNOSIS — I739 Peripheral vascular disease, unspecified: Secondary | ICD-10-CM

## 2022-08-15 ENCOUNTER — Ambulatory Visit
Admission: RE | Admit: 2022-08-15 | Discharge: 2022-08-15 | Disposition: A | Discharge status: Home / Self Care | Payer: Medicare HMO | Source: Ambulatory Visit | Attending: Thoracic Surgery (Cardiothoracic Vascular Surgery) | Admitting: Thoracic Surgery (Cardiothoracic Vascular Surgery)

## 2022-08-15 ENCOUNTER — Ambulatory Visit: Payer: No Typology Code available for payment source | Admitting: Vascular Surgery

## 2022-08-15 ENCOUNTER — Encounter: Payer: Self-pay | Admitting: Thoracic Surgery (Cardiothoracic Vascular Surgery)

## 2022-08-15 ENCOUNTER — Ambulatory Visit: Payer: Medicare HMO | Admitting: Thoracic Surgery (Cardiothoracic Vascular Surgery)

## 2022-08-15 VITALS — BP 150/70 | HR 65 | Resp 20 | Ht 67.0 in | Wt 155.0 lb

## 2022-08-15 DIAGNOSIS — R911 Solitary pulmonary nodule: Secondary | ICD-10-CM

## 2022-08-15 NOTE — Progress Notes (Signed)
[address]       [place], [address]             [phone]      HPI: [name] returns for a follow-up of pulmonary abnormalities on chest CT.  [name] is a 77 year old male with a history of tobacco abuse (quit 2002), lung nodules and other pulmonary opacities, hypertension, hyperlipidemia, atherosclerotic cardiovascular disease, CABG, reflux, type 2 diabetes, stage III chronic kidney disease, and pneumonia.  He underwent coronary bypass grafting in 2017.  On the preoperative chest x-ray he was found to have a cavitary lung nodule.  CT showed multiple small nodules and groundglass opacities bilaterally and he has been follow-up with CT since then.  Over time the groundglass opacities and nodules have waxed and waned.  In the interim since his last visit he had pneumonia about 2 weeks ago.  He has been well other than that.  That was treated with antibiotics and his symptoms have resolved.  No anginal type chest pain.  Past Medical History:  Diagnosis Date   CAD S/P percutaneous coronary angioplasty 1998   PCI TO CX   CKD (chronic kidney disease) stage 3, GFR 30-59 ml/min (HCC)    Diabetes mellitus without complication ([place])    Diverticulitis 08/16/2017   hospitalized with diverticulitis/sepsis   GERD (gastroesophageal reflux disease)    occassionally   Hx of CABG March 2017   x 5   Hypercholesteremia    Hypertension    Neuropathy    Peripheral vascular disease ([place])    s/p R-L FEM-FEM BYPASS   Pneumonia    Tachycardia    after CABG, pt on medicine for this    Current Outpatient Medications  Medication Sig Dispense Refill   albuterol (VENTOLIN HFA) 108 (90 Base) MCG/ACT inhaler Inhale 1-2 puffs into the lungs every 6 (six) hours as needed for wheezing or shortness of breath.     amLODipine (NORVASC) 10 MG tablet Take 10 mg by mouth in the morning.     aspirin EC 81 MG tablet Take 1 tablet (81 mg total) by mouth daily with breakfast. 30 tablet 2    atorvastatin (LIPITOR) 40 MG tablet Take 1 tablet (40 mg total) by mouth daily at 6 PM. (Patient taking differently: Take 40 mg by mouth at bedtime.) 30 tablet 1   cilostazol (PLETAL) 100 MG tablet Take 100 mg by mouth 2 (two) times daily.     clopidogrel (PLAVIX) 75 MG tablet Take 75 mg by mouth in the morning.     empagliflozin (JARDIANCE) 25 MG TABS tablet Take 12.5 mg by mouth in the morning.     gabapentin (NEURONTIN) 600 MG tablet Take 1,800 mg by mouth in the morning and at bedtime.     hydrochlorothiazide (HYDRODIURIL) 12.5 MG tablet Take 12.5 mg by mouth in the morning.     insulin glargine (LANTUS) 100 UNIT/ML injection Inject 20 Units into the skin at bedtime.     losartan (COZAAR) 50 MG tablet Take 50 mg by mouth daily.     metFORMIN (GLUCOPHAGE) 1000 MG tablet Take 1,000 mg by mouth 2 (two) times daily with a meal.      metoprolol tartrate (LOPRESSOR) 50 MG tablet Take 25 mg by mouth 2 (two) times daily.      pantoprazole (PROTONIX) 40 MG tablet Take 1 tablet (40 mg total) by mouth daily. 90 tablet 3   Semaglutide,0.25 or 0.'5MG'$ /DOS, (OZEMPIC, 0.25 OR 0.5 MG/DOSE,) 2  MG/1.5ML SOPN Inject 0.5 mg into the skin every Sunday. Sunday night     vitamin B-12 (CYANOCOBALAMIN) 1000 MCG tablet Take 1,000 mcg by mouth in the morning.     No current facility-administered medications for this visit.    Physical Exam BP (!) 150/70   Pulse 65   Resp 20   Ht '5\' 7"'$  (1.702 m)   Wt 155 lb (70.3 kg)   SpO2 93% Comment: RA  BMI 24.64 kg/m  77 year old man in no acute distress Alert and oriented x 3 with no focal deficits Lungs coarse breath sounds bilaterally but no rales or wheezes Cardiac regular rate and rhythm  Diagnostic Tests: CT CHEST WITHOUT CONTRAST   TECHNIQUE: Multidetector CT imaging of the chest was performed following the standard protocol without IV contrast.   RADIATION DOSE REDUCTION: This exam was performed according to the departmental dose-optimization program which  includes automated exposure control, adjustment of the mA and/or kV according to patient size and/or use of iterative reconstruction technique.   COMPARISON:  Chest CT 01/31/2022 and 07/22/2021.   FINDINGS: Cardiovascular: Again demonstrated is diffuse atherosclerosis of the aorta, great vessels and coronary arteries status post median sternotomy and CABG. The heart size is normal. There is no pericardial effusion.   Mediastinum/Nodes: There are no enlarged mediastinal, hilar or axillary lymph nodes. The thyroid gland, trachea and esophagus demonstrate no significant findings.   Lungs/Pleura: No pleural effusion or pneumothorax. Pulmonary assessment mildly limited by breathing artifact. Again demonstrated is multifocal chronic lung disease with patchy peribronchovascular ground-glass opacities, architectural distortion and postinflammatory scarring. Overall, there has been some improvement compared with the most recent prior study, especially in the right upper lobe. No suspicious pulmonary nodule, confluent airspace opacity or endobronchial lesion identified.   Upper abdomen: The visualized upper abdomen appears stable without significant findings. The gallbladder is not imaged on today's examination.   Musculoskeletal/Chest wall: There is no chest wall mass or suspicious osseous finding. Previous median sternotomy with mild multilevel spondylosis.   IMPRESSION: 1. Chronic lung disease with patchy peribronchovascular ground-glass opacities, architectural distortion and postinflammatory scarring. Overall, there has been some improvement compared with the most recent prior study, especially in the right upper lobe. Findings remain most consistent with chronic inflammatory or post infectious etiology. 2. No suspicious pulmonary nodule or confluent airspace opacity identified. No adenopathy or pleural effusion. 3.  Aortic Atherosclerosis (ICD10-I70.0).     Electronically  Signed   By: Richardean Sale M.D.   On: 08/15/2022 15:34   I personally reviewed the CT images.  There remains patchy groundglass opacities and postinflammatory scarring bilaterally.  No new or suspicious nodules.  Impression: [name] is a 77 year old male with a history of tobacco abuse (quit 2002), lung nodules and other pulmonary opacities, hypertension, hyperlipidemia, atherosclerotic cardiovascular disease, CABG, reflux, type 2 diabetes, stage III chronic kidney disease, and pneumonia.  Status post CABG-5 years out from surgery with no recurrent angina.  Groundglass opacities in lungs bilaterally-has had waxing and waning groundglass opacities.  Most recent scan showed no new or progressive lesions.  Will plan to scan him again in a year.  He does have a 60-pack-year history of smoking.  Plan: Return in 1 year with CT chest  [name], MD [facility] [phone]

## 2022-08-23 ENCOUNTER — Encounter: Payer: Self-pay | Admitting: Internal Medicine

## 2022-08-24 ENCOUNTER — Telehealth: Payer: Self-pay

## 2022-08-24 ENCOUNTER — Ambulatory Visit
Admission: RE | Admit: 2022-08-24 | Discharge: 2022-08-24 | Disposition: A | Discharge status: Home / Self Care | Payer: No Typology Code available for payment source | Source: Ambulatory Visit | Attending: Vascular Surgery | Admitting: Vascular Surgery

## 2022-08-24 DIAGNOSIS — I739 Peripheral vascular disease, unspecified: Secondary | ICD-10-CM

## 2022-08-24 NOTE — Telephone Encounter (Signed)
Patient was scheduled for a CT abdomen / pelvis today with contrast.  His creatinin is 2.5 and his GFR is 26.  The GFR cutoff for that test is 30.  Gerri Lins, PA-C was informed and contacted Dr. Carlis Abbott.  The decision was made to keep his appointment with Dr. Carlis Abbott on 1/16.  Jose at [facility] will make the patient aware.

## 2022-08-29 ENCOUNTER — Encounter: Payer: Self-pay | Admitting: Vascular Surgery

## 2022-08-29 ENCOUNTER — Ambulatory Visit (INDEPENDENT_AMBULATORY_CARE_PROVIDER_SITE_OTHER): Payer: No Typology Code available for payment source | Admitting: Vascular Surgery

## 2022-08-29 VITALS — BP 150/70 | HR 75 | Temp 97.6°F | Resp 18 | Ht 66.5 in | Wt 152.0 lb

## 2022-08-29 DIAGNOSIS — I739 Peripheral vascular disease, unspecified: Secondary | ICD-10-CM | POA: Diagnosis not present

## 2022-08-29 NOTE — Progress Notes (Signed)
Patient name: [name] MRN: [id number] DOB: 1946-04-19 Sex: male  REASON FOR CONSULT: F/U after planned CTA abdomen/pelvis  HPI: [name] is a 77 y.o. male, with history of coronary artery disease, diabetes, hypertension, hyperlipidemia, history of femoral-femoral bypass that presents for follow-up after planned CTA abdomen pelvis.  Sent for CTA abdomen pelvis to evaluate what looked like a high-grade stenosis in his distal abdominal aorta which is the inflow for his right iliac stents and right to left fem fem bypass.  This was canceled late last week after his kidney numbers suggest that his creatinine had jumped to 2.5 with a GFR of 26.  He states his legs are doing ok, but he doesn't walk much.  He had a femorofemoral bypass with Dr. Amedeo Plenty years ago.  As recently as 08/10/2016 he had a redo right femoral artery exposure with iliofemoral endarterectomy bovine patch and redo right limb of his femorofemoral bypass by Dr. Trula Slade.  On 09/19/2016 he then had ultrasound-guided access of left brachial artery with angioplasty of the right common femoral artery, stent of the right external iliac, and stent of the right common femoral artery also by Dr. Trula Slade.  I then performed right common iliac artery angioplasty and stenting with a 7 x 45 VBX as well as a drug-coated balloon angioplasty of the right external iliac artery on 09/22/21 for a recurrent high grade stenosis.      Past Medical History:  Diagnosis Date   CAD S/P percutaneous coronary angioplasty 1998   PCI TO CX   CKD (chronic kidney disease) stage 3, GFR 30-59 ml/min (HCC)    Diabetes mellitus without complication ([place])    Diverticulitis 08/16/2017   hospitalized with diverticulitis/sepsis   GERD (gastroesophageal reflux disease)    occassionally   Hx of CABG March 2017   x 5   Hypercholesteremia    Hypertension    Neuropathy    Peripheral vascular disease ([place])    s/p R-L FEM-FEM BYPASS   Pneumonia    Tachycardia     after CABG, pt on medicine for this    Past Surgical History:  Procedure Laterality Date   ABDOMINAL AORTOGRAM Bilateral 09/19/2016   Procedure: iliac;  Surgeon: Serafina Mitchell, MD;  Location: [place] [facility];  Service: Cardiovascular;  Laterality: Bilateral;   ABDOMINAL AORTOGRAM W/LOWER EXTREMITY N/A 09/22/2021   Procedure: ABDOMINAL AORTOGRAM W/LOWER EXTREMITY;  Surgeon: [name], MD;  Location: [place] [facility];  Service: Cardiovascular;  Laterality: N/A;   CARDIAC CATHETERIZATION  2003   with stent   CARDIAC CATHETERIZATION N/A 10/11/2015   Procedure: Left Heart Cath and Coronary Angiography;  Surgeon: Peter M Martinique, MD;  Location: [place] [facility];  Service: Cardiovascular;  Laterality: N/A;   COLON RESECTION N/A 12/13/2017   Procedure: EXPLORATORY LAPAROTOMY, SIGMOID COLECTOMY WITH COLOSTOMY;  Surgeon: Donnie Mesa, MD;  Location: [place];  Service: General;  Laterality: N/A;   COLONOSCOPY N/A 09/22/2013   Procedure: COLONOSCOPY;  Surgeon: Daneil Dolin, MD;  [facility];  Service: Endoscopy;  Laterality: N/A;  9:30 AM   COLONOSCOPY N/A 02/19/2018   Procedure: COLONOSCOPY;  Surgeon: Daneil Dolin, MD;  [facility];  Service: Endoscopy;  Laterality: N/A;  2:00pm   COLOSTOMY N/A 12/13/2017   Procedure: COLOSTOMY;  Surgeon: Donnie Mesa, MD;  Location: [place];  Service: General;  Laterality: N/A;   COLOSTOMY REVERSAL N/A 03/26/2018   Procedure: COLOSTOMY REVERSAL;  Surgeon: Donnie Mesa,  MD;  Location: [place];  Service: General;  Laterality: N/A;   CORONARY ARTERY BYPASS GRAFT N/A 10/18/2015   Procedure: CORONARY ARTERY BYPASS GRAFTING (CABG) x  five, using left internal mammary artery and right leg greater saphenous vein harvested endoscopically;  Surgeon: [name], MD;  Location: [place];  Service: Open Heart Surgery;  Laterality: N/A;   ENDARTERECTOMY FEMORAL Right 08/09/2016   Procedure: ENDARTERECTOMY FEMORAL WITH VEIN PATCH ANGIOPLASTY;   Surgeon: Serafina Mitchell, MD;  [facility];  Service: Vascular;  Laterality: Right;   ESOPHAGOGASTRODUODENOSCOPY N/A 10/24/2017   Dr. Gala Romney: hiatal hernia   FEMORAL-FEMORAL BYPASS GRAFT Bilateral 08/09/2016   Procedure: REVISION BYPASS GRAFT RIGHT FEMORAL-LEFT FEMORAL ARTERY;  Surgeon: Serafina Mitchell, MD;  Location: [place];  Service: Vascular;  Laterality: Bilateral;   FEMORAL-POPLITEAL BYPASS GRAFT     INTRAMEDULLARY (IM) NAIL INTERTROCHANTERIC Left 04/03/2021   Procedure: LEFT  HIP INTRAMEDULLARY (IM) NAIL INTERTROCHANTRIC;  Surgeon: Mcarthur Rossetti, MD;  Location: [place];  Service: Orthopedics;  Laterality: Left;   PERIPHERAL VASCULAR BALLOON ANGIOPLASTY Right 09/22/2021   Procedure: PERIPHERAL VASCULAR BALLOON ANGIOPLASTY;  Surgeon: [name], MD;  Location: [place] [facility];  Service: Cardiovascular;  Laterality: Right;   PERIPHERAL VASCULAR CATHETERIZATION N/A 05/08/2016   Procedure: Lower Extremity Angiography;  Surgeon: Lorretta Harp, MD;  Location: [place] [facility];  Service: Cardiovascular;  Laterality: N/A;   PERIPHERAL VASCULAR INTERVENTION Right 09/19/2016   Procedure: Peripheral Vascular Intervention;  Surgeon: Serafina Mitchell, MD;  Location: [place] [facility];  Service: Cardiovascular;  Laterality: Right;  ext iliac stent   PERIPHERAL VASCULAR INTERVENTION Right 09/22/2021   Procedure: PERIPHERAL VASCULAR INTERVENTION;  Surgeon: [name], MD;  Location: [place] [facility];  Service: Cardiovascular;  Laterality: Right;   POLYPECTOMY  02/19/2018   Procedure: POLYPECTOMY;  Surgeon: Daneil Dolin, MD;  [facility];  Service: Endoscopy;;   PROCTOSCOPY  10/24/2017   Procedure: PROCTOSCOPY;  Surgeon: Daneil Dolin, MD;  [facility];  Service: Endoscopy;;   TEE WITHOUT CARDIOVERSION N/A 10/18/2015   Procedure: TRANSESOPHAGEAL ECHOCARDIOGRAM (TEE);  Surgeon: [name], MD;  Location: [place];  Service: Open Heart Surgery;   Laterality: N/A;    Family History  Problem Relation Age of Onset   Heart attack Mother    Stroke Mother    Colon cancer Brother        late 37s    SOCIAL HISTORY: Social History   Socioeconomic History   Marital status: Married    Spouse name: Not on file   Number of children: Not on file   Years of education: Not on file   Highest education level: Not on file  Occupational History   Not on file  Tobacco Use   Smoking status: Former    Packs/day: 2.00    Years: 30.00    Total pack years: 60.00    Types: Cigarettes    Quit date: 08/14/2000    Years since quitting: 22.0   Smokeless tobacco: Never  Vaping Use   Vaping Use: Never used  Substance and Sexual Activity   Alcohol use: No   Drug use: No   Sexual activity: Not on file  Other Topics Concern   Not on file  Social History Narrative   Not on file   Social Determinants of Health   Financial Resource Strain: Not on file  Food Insecurity: Not on file  Transportation Needs: Not on  file  Physical Activity: Not on file  Stress: Not on file  Social Connections: Not on file  Intimate Partner Violence: Not on file    No Known Allergies  Current Outpatient Medications  Medication Sig Dispense Refill   albuterol (VENTOLIN HFA) 108 (90 Base) MCG/ACT inhaler Inhale 1-2 puffs into the lungs every 6 (six) hours as needed for wheezing or shortness of breath.     amLODipine (NORVASC) 10 MG tablet Take 10 mg by mouth in the morning.     aspirin EC 81 MG tablet Take 1 tablet (81 mg total) by mouth daily with breakfast. 30 tablet 2   atorvastatin (LIPITOR) 40 MG tablet Take 1 tablet (40 mg total) by mouth daily at 6 PM. (Patient taking differently: Take 40 mg by mouth at bedtime.) 30 tablet 1   cilostazol (PLETAL) 100 MG tablet Take 100 mg by mouth 2 (two) times daily.     clopidogrel (PLAVIX) 75 MG tablet Take 75 mg by mouth in the morning.     empagliflozin (JARDIANCE) 25 MG TABS tablet Take 12.5 mg by mouth in the  morning.     gabapentin (NEURONTIN) 600 MG tablet Take 1,800 mg by mouth in the morning and at bedtime.     hydrochlorothiazide (HYDRODIURIL) 12.5 MG tablet Take 12.5 mg by mouth in the morning.     insulin glargine (LANTUS) 100 UNIT/ML injection Inject 20 Units into the skin at bedtime.     losartan (COZAAR) 50 MG tablet Take 50 mg by mouth daily.     metFORMIN (GLUCOPHAGE) 1000 MG tablet Take 1,000 mg by mouth 2 (two) times daily with a meal.      metoprolol tartrate (LOPRESSOR) 50 MG tablet Take 25 mg by mouth 2 (two) times daily.      pantoprazole (PROTONIX) 40 MG tablet Take 1 tablet (40 mg total) by mouth daily. 90 tablet 3   Semaglutide,0.25 or 0.'5MG'$ /DOS, (OZEMPIC, 0.25 OR 0.5 MG/DOSE,) 2 MG/1.5ML SOPN Inject 0.5 mg into the skin every Sunday. Sunday night     vitamin B-12 (CYANOCOBALAMIN) 1000 MCG tablet Take 1,000 mcg by mouth in the morning.     No current facility-administered medications for this visit.    REVIEW OF SYSTEMS:  '[X]'$  denotes positive finding, '[ ]'$  denotes negative finding Cardiac  Comments:  Chest pain or chest pressure:    Shortness of breath upon exertion:    Short of breath when lying flat:    Irregular heart rhythm:        Vascular    Pain in calf, thigh, or hip brought on by ambulation: x   Pain in feet at night that wakes you up from your sleep:     Blood clot in your veins:    Leg swelling:         Pulmonary    Oxygen at home:    Productive cough:     Wheezing:         Neurologic    Sudden weakness in arms or legs:     Sudden numbness in arms or legs:     Sudden onset of difficulty speaking or slurred speech:    Temporary loss of vision in one eye:     Problems with dizziness:         Gastrointestinal    Blood in stool:     Vomited blood:         Genitourinary    Burning when urinating:     Blood in urine:  Psychiatric    Major depression:         Hematologic    Bleeding problems:    Problems with blood clotting too easily:         Skin    Rashes or ulcers:        Constitutional    Fever or chills:      PHYSICAL EXAM: Vitals:   08/29/22 1405  BP: (!) 150/70  Pulse: 75  Resp: 18  Temp: 97.6 F (36.4 C)  TempSrc: Temporal  SpO2: 95%  Weight: 152 lb (68.9 kg)  Height: 5' 6.5" (1.689 m)    GENERAL: The patient is a well-nourished male, in no acute distress. The vital signs are documented above. CARDIAC: There is a regular rate and rhythm.  VASCULAR:  Palpable femoral pulse bilaterally Palpable pulse in femorofemoral bypass No palpable pedal pulses No lower extremity tissue loss PULMONARY: No respiratory distress. ABDOMEN: Soft and non-tender. MUSCULOSKELETAL: There are no major deformities or cyanosis. NEUROLOGIC: No focal weakness or paresthesias are detected. PSYCHIATRIC: The patient has a normal affect.  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 There is also concern for a high-grade stenosis in the proximal right to left femorofemoral bypass with a velocity of 461.  Assessment/Plan:  77 yo M that had a femorofemoral bypass with Dr. Amedeo Plenty years ago.  As recently as 08/10/2016 he had a redo right femoral artery exposure with iliofemoral endarterectomy bovine patch and redo right limb of his femorofemoral bypass by Dr. Trula Slade.  On 09/19/2016 he then had ultrasound-guided access of left brachial artery with angioplasty of the right common femoral artery, stent of the right external iliac, and stent of the right common femoral artery also by Dr. Trula Slade.  I then performed right common iliac artery angioplasty and stenting with a 7 x 40 VBX as well as a drug-coated balloon angioplasty of the right external iliac artery on 09/22/21 for a recurrent high grade stenosis.  On last follow-up he had a increasing stenosis in the distal abdominal aorta proximal to his iliac stents.  He has a  velocity of 537.  There was also concern for high-grade stenosis in the proximal right to left femorofemoral bypass.  I had recommended CTA abdomen pelvis with runoff to evaluate what options we had.  This had to be canceled end of last week as he had evidence of creatinine 2.5 with GFR of now 26.  He has known CKD but 9 months ago his creatinine was 1.6.  Him and his wife seem quite surprised by his worsening kidney numbers.  They state they have never seen a kidney doctor.  Most of his care has been at the [place].  I have offered to send him to [facility] here for another opinion as far as his kidney disease.  Discussed we will cancel his CT and plan aortogram, lower extremity arteriogram, with possible intervention from a femorofemoral approach to try and maintain long-term patency of his right iliac stents as well as his femorofemoral bypass in the Cath Lab and try and use CO2 for is much as possible.  I will delay this until after he had a chance to see one of the nephrologist.  [name], MD [facility]: [phone]

## 2022-09-26 ENCOUNTER — Other Ambulatory Visit: Payer: Self-pay

## 2022-09-26 DIAGNOSIS — I771 Stricture of artery: Secondary | ICD-10-CM

## 2022-09-26 DIAGNOSIS — I350 Nonrheumatic aortic (valve) stenosis: Secondary | ICD-10-CM

## 2022-09-27 ENCOUNTER — Telehealth: Payer: Self-pay

## 2022-09-27 NOTE — Telephone Encounter (Signed)
Received a call from [facility] at [facility]. Reports Dr. Candiss Norse advised patient, his kidney function is relatively stable and reassuring. It will be OK to proceed with procedure with vascular surgeon as planned.

## 2022-10-05 ENCOUNTER — Ambulatory Visit (HOSPITAL_COMMUNITY)
Admission: RE | Admit: 2022-10-05 | Discharge: 2022-10-05 | Disposition: A | Discharge status: Home / Self Care | Payer: No Typology Code available for payment source | Attending: Vascular Surgery | Admitting: Vascular Surgery

## 2022-10-05 ENCOUNTER — Ambulatory Visit (HOSPITAL_COMMUNITY)
Admission: RE | Disposition: A | Discharge status: Home / Self Care | Payer: Self-pay | Source: Home / Self Care | Attending: Vascular Surgery

## 2022-10-05 ENCOUNTER — Other Ambulatory Visit: Payer: Self-pay

## 2022-10-05 DIAGNOSIS — Z7902 Long term (current) use of antithrombotics/antiplatelets: Secondary | ICD-10-CM | POA: Diagnosis not present

## 2022-10-05 DIAGNOSIS — I2510 Atherosclerotic heart disease of native coronary artery without angina pectoris: Secondary | ICD-10-CM | POA: Diagnosis not present

## 2022-10-05 DIAGNOSIS — Z87891 Personal history of nicotine dependence: Secondary | ICD-10-CM | POA: Insufficient documentation

## 2022-10-05 DIAGNOSIS — Z794 Long term (current) use of insulin: Secondary | ICD-10-CM | POA: Diagnosis not present

## 2022-10-05 DIAGNOSIS — Z79899 Other long term (current) drug therapy: Secondary | ICD-10-CM | POA: Diagnosis not present

## 2022-10-05 DIAGNOSIS — I350 Nonrheumatic aortic (valve) stenosis: Secondary | ICD-10-CM | POA: Insufficient documentation

## 2022-10-05 DIAGNOSIS — E1122 Type 2 diabetes mellitus with diabetic chronic kidney disease: Secondary | ICD-10-CM | POA: Insufficient documentation

## 2022-10-05 DIAGNOSIS — I129 Hypertensive chronic kidney disease with stage 1 through stage 4 chronic kidney disease, or unspecified chronic kidney disease: Secondary | ICD-10-CM | POA: Insufficient documentation

## 2022-10-05 DIAGNOSIS — E1151 Type 2 diabetes mellitus with diabetic peripheral angiopathy without gangrene: Secondary | ICD-10-CM | POA: Insufficient documentation

## 2022-10-05 DIAGNOSIS — Z7984 Long term (current) use of oral hypoglycemic drugs: Secondary | ICD-10-CM | POA: Insufficient documentation

## 2022-10-05 DIAGNOSIS — E785 Hyperlipidemia, unspecified: Secondary | ICD-10-CM | POA: Insufficient documentation

## 2022-10-05 DIAGNOSIS — I771 Stricture of artery: Secondary | ICD-10-CM

## 2022-10-05 DIAGNOSIS — T82898A Other specified complication of vascular prosthetic devices, implants and grafts, initial encounter: Secondary | ICD-10-CM | POA: Diagnosis not present

## 2022-10-05 DIAGNOSIS — N183 Chronic kidney disease, stage 3 unspecified: Secondary | ICD-10-CM | POA: Diagnosis not present

## 2022-10-05 DIAGNOSIS — Z7982 Long term (current) use of aspirin: Secondary | ICD-10-CM | POA: Diagnosis not present

## 2022-10-05 DIAGNOSIS — Z7985 Long-term (current) use of injectable non-insulin antidiabetic drugs: Secondary | ICD-10-CM | POA: Diagnosis not present

## 2022-10-05 HISTORY — PX: ABDOMINAL AORTOGRAM W/LOWER EXTREMITY: CATH118223

## 2022-10-05 LAB — GLUCOSE, CAPILLARY: Glucose-Capillary: 130 mg/dL — ABNORMAL HIGH (ref 70–99)

## 2022-10-05 LAB — POCT I-STAT, CHEM 8
BUN: 28 mg/dL — ABNORMAL HIGH (ref 8–23)
Calcium, Ion: 1.14 mmol/L — ABNORMAL LOW (ref 1.15–1.40)
Chloride: 105 mmol/L (ref 98–111)
Creatinine, Ser: 2.4 mg/dL — ABNORMAL HIGH (ref 0.61–1.24)
Glucose, Bld: 125 mg/dL — ABNORMAL HIGH (ref 70–99)
HCT: 40 % (ref 39.0–52.0)
Hemoglobin: 13.6 g/dL (ref 13.0–17.0)
Potassium: 4.3 mmol/L (ref 3.5–5.1)
Sodium: 139 mmol/L (ref 135–145)
TCO2: 24 mmol/L (ref 22–32)

## 2022-10-05 SURGERY — ABDOMINAL AORTOGRAM W/LOWER EXTREMITY
Anesthesia: LOCAL

## 2022-10-05 MED ORDER — LIDOCAINE HCL (PF) 1 % IJ SOLN
INTRAMUSCULAR | Status: AC
  Filled 2022-10-05: qty 60

## 2022-10-05 MED ORDER — HEPARIN (PORCINE) IN NACL 1000-0.9 UT/500ML-% IV SOLN
INTRAVENOUS | Status: DC | PRN
  Administered 2022-10-05 (×2): 500 mL

## 2022-10-05 MED ORDER — SODIUM CHLORIDE 0.9 % IV SOLN: 250.0000 mL | INTRAVENOUS | Status: DC | PRN

## 2022-10-05 MED ORDER — ACETAMINOPHEN 325 MG PO TABS: 650.0000 mg | ORAL_TABLET | ORAL | Status: DC | PRN

## 2022-10-05 MED ORDER — HYDRALAZINE HCL 20 MG/ML IJ SOLN
INTRAMUSCULAR | Status: AC
  Filled 2022-10-05: qty 1

## 2022-10-05 MED ORDER — LABETALOL HCL 5 MG/ML IV SOLN
10.0000 mg | INTRAVENOUS | Status: DC | PRN
  Administered 2022-10-05: 10 mg via INTRAVENOUS

## 2022-10-05 MED ORDER — LIDOCAINE HCL (PF) 1 % IJ SOLN
INTRAMUSCULAR | Status: DC | PRN
  Administered 2022-10-05: 15 mL

## 2022-10-05 MED ORDER — HEPARIN SODIUM (PORCINE) 1000 UNIT/ML IJ SOLN
INTRAMUSCULAR | Status: DC | PRN
  Administered 2022-10-05: 5000 [IU] via INTRAVENOUS

## 2022-10-05 MED ORDER — SODIUM CHLORIDE 0.9 % IV SOLN: INTRAVENOUS | Status: DC

## 2022-10-05 MED ORDER — MIDAZOLAM HCL 2 MG/2ML IJ SOLN
INTRAMUSCULAR | Status: DC | PRN
  Administered 2022-10-05: 1 mg via INTRAVENOUS

## 2022-10-05 MED ORDER — SODIUM CHLORIDE 0.9% FLUSH: 3.0000 mL | Freq: Two times a day (BID) | INTRAVENOUS | Status: DC

## 2022-10-05 MED ORDER — HYDRALAZINE HCL 20 MG/ML IJ SOLN
INTRAMUSCULAR | Status: DC | PRN
  Administered 2022-10-05: 10 mg via INTRAVENOUS

## 2022-10-05 MED ORDER — FENTANYL CITRATE (PF) 100 MCG/2ML IJ SOLN
INTRAMUSCULAR | Status: DC | PRN
  Administered 2022-10-05: 25 ug via INTRAVENOUS

## 2022-10-05 MED ORDER — IODIXANOL 320 MG/ML IV SOLN
INTRAVENOUS | Status: DC | PRN
  Administered 2022-10-05: 12 mL

## 2022-10-05 MED ORDER — ONDANSETRON HCL 4 MG/2ML IJ SOLN: 4.0000 mg | Freq: Four times a day (QID) | INTRAMUSCULAR | Status: DC | PRN

## 2022-10-05 MED ORDER — HYDRALAZINE HCL 20 MG/ML IJ SOLN: 5.0000 mg | INTRAMUSCULAR | Status: DC | PRN

## 2022-10-05 MED ORDER — SODIUM CHLORIDE 0.9% FLUSH: 3.0000 mL | INTRAVENOUS | Status: DC | PRN

## 2022-10-05 MED ORDER — LABETALOL HCL 5 MG/ML IV SOLN
INTRAVENOUS | Status: AC
  Filled 2022-10-05: qty 4

## 2022-10-05 MED ORDER — HEPARIN SODIUM (PORCINE) 1000 UNIT/ML IJ SOLN
INTRAMUSCULAR | Status: AC
  Filled 2022-10-05: qty 10

## 2022-10-05 MED ORDER — FENTANYL CITRATE (PF) 100 MCG/2ML IJ SOLN
INTRAMUSCULAR | Status: AC
  Filled 2022-10-05: qty 2

## 2022-10-05 MED ORDER — MIDAZOLAM HCL 5 MG/5ML IJ SOLN
INTRAMUSCULAR | Status: AC
  Filled 2022-10-05: qty 5

## 2022-10-05 SURGICAL SUPPLY — 13 items
CATH ANGIO 5F BER2 65CM (CATHETERS) IMPLANT
CATH OMNI FLUSH 5F 65CM (CATHETERS) IMPLANT
KIT ANGIASSIST CO2 SYSTEM (KITS) IMPLANT
KIT MICROPUNCTURE NIT STIFF (SHEATH) IMPLANT
KIT PV (KITS) ×2 IMPLANT
SHEATH PINNACLE 5F 10CM (SHEATH) IMPLANT
SHEATH PROBE COVER 6X72 (BAG) IMPLANT
STOPCOCK MORSE 400PSI 3WAY (MISCELLANEOUS) IMPLANT
SYR MEDRAD MARK 7 150ML (SYRINGE) ×2 IMPLANT
TRANSDUCER W/STOPCOCK (MISCELLANEOUS) ×2 IMPLANT
TRAY PV CATH (CUSTOM PROCEDURE TRAY) ×2 IMPLANT
TUBING CIL FLEX 10 FLL-RA (TUBING) IMPLANT
WIRE STARTER BENTSON 035X150 (WIRE) IMPLANT

## 2022-10-05 NOTE — H&P (Signed)
History and Physical Interval Note:  10/05/2022 1:58 PM  [name]  has presented today for surgery, with the diagnosis of illac artery and aortic stenosis.  The various methods of treatment have been discussed with the patient and family. After consideration of risks, benefits and other options for treatment, the patient has consented to  Procedure(s): ABDOMINAL AORTOGRAM W/LOWER EXTREMITY (N/A) as a surgical intervention.  The patient's history has been reviewed, patient examined, no change in status, stable for surgery.  I have reviewed the patient's chart and labs.  Questions were answered to the patient's satisfaction.     [name]     Patient name: [name]    MRN: [id number]        DOB: 12/17/1945          Sex: male   REASON FOR CONSULT: F/U after planned CTA abdomen/pelvis   HPI: [name] is a 77 y.o. male, with history of coronary artery disease, diabetes, hypertension, hyperlipidemia, history of femoral-femoral bypass that presents for follow-up after planned CTA abdomen pelvis.  Sent for CTA abdomen pelvis to evaluate what looked like a high-grade stenosis in his distal abdominal aorta which is the inflow for his right iliac stents and right to left fem fem bypass.  This was canceled late last week after his kidney numbers suggest that his creatinine had jumped to 2.5 with a GFR of 26.  He states his legs are doing ok, but he doesn't walk much.   He had a femorofemoral bypass with Dr. Amedeo Plenty years ago.  As recently as 08/10/2016 he had a redo right femoral artery exposure with iliofemoral endarterectomy bovine patch and redo right limb of his femorofemoral bypass by Dr. Trula Slade.  On 09/19/2016 he then had ultrasound-guided access of left brachial artery with angioplasty of the right common femoral artery, stent of the right external iliac, and stent of the right common femoral artery also by Dr. Trula Slade.  I then performed right common iliac artery angioplasty and  stenting with a 7 x 96 VBX as well as a drug-coated balloon angioplasty of the right external iliac artery on 09/22/21 for a recurrent high grade stenosis.             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    x 5   Hypercholesteremia     Hypertension     Neuropathy     Peripheral vascular disease ([place])      s/p R-L FEM-FEM BYPASS   Pneumonia     Tachycardia      after CABG, pt on medicine for this           Past Surgical History:  Procedure Laterality Date   ABDOMINAL AORTOGRAM Bilateral 09/19/2016    Procedure: iliac;  Surgeon: Serafina Mitchell, MD;  Location: [facility];  Service: Cardiovascular;  Laterality: Bilateral;   ABDOMINAL AORTOGRAM W/LOWER EXTREMITY N/A 09/22/2021    Procedure: ABDOMINAL AORTOGRAM W/LOWER EXTREMITY;  Surgeon: [name], MD;  Location: [facility];  Service: Cardiovascular;  Laterality: N/A;   CARDIAC CATHETERIZATION  2003    with stent   CARDIAC CATHETERIZATION N/A 10/11/2015    Procedure: Left Heart Cath and Coronary Angiography;  Surgeon: Peter M Martinique, MD;  Location: [facility];  Service: Cardiovascular;  Laterality: N/A;   COLON RESECTION N/A 12/13/2017    Procedure: EXPLORATORY LAPAROTOMY, SIGMOID COLECTOMY WITH COLOSTOMY;  Surgeon: Donnie Mesa, MD;  Location: [facility];  Service: General;  Laterality: N/A;   COLONOSCOPY N/A 09/22/2013    Procedure: COLONOSCOPY;  Surgeon: Daneil Dolin, MD;  [facility];  Service: Endoscopy;  Laterality: N/A;  9:30 AM   COLONOSCOPY N/A 02/19/2018    Procedure: COLONOSCOPY;  Surgeon: Daneil Dolin, MD;  [facility];  Service: Endoscopy;   Laterality: N/A;  2:00pm   COLOSTOMY N/A 12/13/2017    Procedure: COLOSTOMY;  Surgeon: Donnie Mesa, MD;  Location: [facility];  Service: General;  Laterality: N/A;   COLOSTOMY REVERSAL N/A 03/26/2018    Procedure: COLOSTOMY REVERSAL;  Surgeon: Donnie Mesa, MD;  Location: [facility];  Service: General;  Laterality: N/A;   CORONARY ARTERY BYPASS GRAFT N/A 10/18/2015    Procedure: CORONARY ARTERY BYPASS GRAFTING (CABG) x  five, using left internal mammary artery and right leg greater saphenous vein harvested endoscopically;  Surgeon: [name], MD;  Location: [facility];  Service: Open Heart Surgery;  Laterality: N/A;   ENDARTERECTOMY FEMORAL Right 08/09/2016    Procedure: ENDARTERECTOMY FEMORAL WITH VEIN PATCH ANGIOPLASTY;  Surgeon: Serafina Mitchell, MD;  [facility];  Service: Vascular;  Laterality: Right;   ESOPHAGOGASTRODUODENOSCOPY N/A 10/24/2017    Dr. Gala Romney: hiatal hernia   FEMORAL-FEMORAL BYPASS GRAFT Bilateral 08/09/2016    Procedure: REVISION BYPASS GRAFT RIGHT FEMORAL-LEFT FEMORAL ARTERY;  Surgeon: Serafina Mitchell, MD;  Location: [facility];  Service: Vascular;  Laterality: Bilateral;   FEMORAL-POPLITEAL BYPASS GRAFT       INTRAMEDULLARY (IM) NAIL INTERTROCHANTERIC Left 04/03/2021    Procedure: LEFT  HIP INTRAMEDULLARY (IM) NAIL INTERTROCHANTRIC;  Surgeon: Mcarthur Rossetti, MD;  Location: [facility];  Service: Orthopedics;  Laterality: Left;   PERIPHERAL VASCULAR BALLOON ANGIOPLASTY Right 09/22/2021    Procedure: PERIPHERAL VASCULAR BALLOON ANGIOPLASTY;  Surgeon: [name], MD;  Location: [facility];  Service: Cardiovascular;  Laterality: Right;   PERIPHERAL VASCULAR CATHETERIZATION N/A 05/08/2016    Procedure: Lower Extremity Angiography;  Surgeon: Lorretta Harp, MD;  Location: [facility];  Service: Cardiovascular;  Laterality: N/A;   PERIPHERAL VASCULAR INTERVENTION Right 09/19/2016    Procedure: Peripheral Vascular Intervention;  Surgeon: Serafina Mitchell, MD;   Location: [facility];  Service: Cardiovascular;  Laterality: Right;  ext iliac stent   PERIPHERAL VASCULAR INTERVENTION Right 09/22/2021    Procedure: PERIPHERAL VASCULAR INTERVENTION;  Surgeon: [name], MD;  Location: [facility];  Service: Cardiovascular;  Laterality: Right;   POLYPECTOMY   02/19/2018    Procedure: POLYPECTOMY;  Surgeon: Daneil Dolin, MD;  [facility];  Service: Endoscopy;;   PROCTOSCOPY   10/24/2017    Procedure: PROCTOSCOPY;  Surgeon: Daneil Dolin, MD;  [facility];  Service: Endoscopy;;   TEE WITHOUT CARDIOVERSION N/A 10/18/2015    Procedure: TRANSESOPHAGEAL ECHOCARDIOGRAM (TEE);  Surgeon: [name], MD;  Location: [facility];  Service: Open Heart Surgery;  Laterality: N/A;           Family History  Problem Relation Age of Onset   Heart attack Mother  Stroke Mother     Colon cancer Brother          late 36s      SOCIAL HISTORY: Social History         Socioeconomic History   Marital status: Married      Spouse name: Not on file   Number of children: Not on file   Years of education: Not on file   Highest education level: Not on file  Occupational History   Not on file  Tobacco Use   Smoking status: Former      Packs/day: 2.00      Years: 30.00      Total pack years: 60.00      Types: Cigarettes      Quit date: 08/14/2000      Years since quitting: 22.0   Smokeless tobacco: Never  Vaping Use   Vaping Use: Never used  Substance and Sexual Activity   Alcohol use: No   Drug use: No   Sexual activity: Not on file  Other Topics Concern   Not on file  Social History Narrative   Not on file    Social Determinants of Health    Financial Resource Strain: Not on file  Food Insecurity: Not on file  Transportation Needs: Not on file  Physical Activity: Not on file  Stress: Not on file  Social Connections: Not on file  Intimate Partner Violence: Not on file      No Known Allergies          Current Outpatient Medications  Medication Sig Dispense Refill   albuterol (VENTOLIN HFA) 108 (90 Base) MCG/ACT inhaler Inhale 1-2 puffs into the lungs every 6 (six) hours as needed for wheezing or shortness of breath.       amLODipine (NORVASC) 10 MG tablet Take 10 mg by mouth in the morning.       aspirin EC 81 MG tablet Take 1 tablet (81 mg total) by mouth daily with breakfast. 30 tablet 2   atorvastatin (LIPITOR) 40 MG tablet Take 1 tablet (40 mg total) by mouth daily at 6 PM. (Patient taking differently: Take 40 mg by mouth at bedtime.) 30 tablet 1   cilostazol (PLETAL) 100 MG tablet Take 100 mg by mouth 2 (two) times daily.       clopidogrel (PLAVIX) 75 MG tablet Take 75 mg by mouth in the morning.       empagliflozin (JARDIANCE) 25 MG TABS tablet Take 12.5 mg by mouth in the morning.       gabapentin (NEURONTIN) 600 MG tablet Take 1,800 mg by mouth in the morning and at bedtime.       hydrochlorothiazide (HYDRODIURIL) 12.5 MG tablet Take 12.5 mg by mouth in the morning.       insulin glargine (LANTUS) 100 UNIT/ML injection Inject 20 Units into the skin at bedtime.       losartan (COZAAR) 50 MG tablet Take 50 mg by mouth daily.       metFORMIN (GLUCOPHAGE) 1000 MG tablet Take 1,000 mg by mouth 2 (two) times daily with a meal.        metoprolol tartrate (LOPRESSOR) 50 MG tablet Take 25 mg by mouth 2 (two) times daily.        pantoprazole (PROTONIX) 40 MG tablet Take 1 tablet (40 mg total) by mouth daily. 90 tablet 3   Semaglutide,0.25 or 0.5MG/DOS, (OZEMPIC, 0.25 OR 0.5 MG/DOSE,) 2 MG/1.5ML SOPN Inject 0.5 mg into the skin every Sunday. Sunday  night       vitamin B-12 (CYANOCOBALAMIN) 1000 MCG tablet Take 1,000 mcg by mouth in the morning.        No current facility-administered medications for this visit.      REVIEW OF SYSTEMS:  [X]$  denotes positive finding, [ ]$  denotes negative finding Cardiac   Comments:  Chest pain or chest pressure:      Shortness of breath upon exertion:       Short of breath when lying flat:      Irregular heart rhythm:             Vascular      Pain in calf, thigh, or hip brought on by ambulation: x    Pain in feet at night that wakes you up from your sleep:       Blood clot in your veins:      Leg swelling:              Pulmonary      Oxygen at home:      Productive cough:       Wheezing:              Neurologic      Sudden weakness in arms or legs:       Sudden numbness in arms or legs:       Sudden onset of difficulty speaking or slurred speech:      Temporary loss of vision in one eye:       Problems with dizziness:              Gastrointestinal      Blood in stool:       Vomited blood:              Genitourinary      Burning when urinating:       Blood in urine:             Psychiatric      Major depression:              Hematologic      Bleeding problems:      Problems with blood clotting too easily:             Skin      Rashes or ulcers:             Constitutional      Fever or chills:          PHYSICAL EXAM:    Vitals:    08/29/22 1405  BP: (!) 150/70  Pulse: 75  Resp: 18  Temp: 97.6 F (36.4 C)  TempSrc: Temporal  SpO2: 95%  Weight: 152 lb (68.9 kg)  Height: 5' 6.5" (1.689 m)      GENERAL: The patient is a well-nourished male, in no acute distress. The vital signs are documented above. CARDIAC: There is a regular rate and rhythm.  VASCULAR:  Palpable femoral pulse bilaterally Palpable pulse in femorofemoral bypass No palpable pedal pulses No lower extremity tissue loss PULMONARY: No respiratory distress. ABDOMEN: Soft and non-tender. MUSCULOSKELETAL: There are no major deformities or cyanosis. NEUROLOGIC: No focal weakness or paresthesias are detected. PSYCHIATRIC: The patient has a normal affect.   DATA:      Aortoiliac duplex last visit showed a high-grade stenosis in the distal abdominal aorta with a velocity of 537 proximal to his right common iliac stent. There is also concern for  a high-grade stenosis in the proximal right  to left femorofemoral bypass with a velocity of 461.   Assessment/Plan:   77 yo M that had a femorofemoral bypass with Dr. Amedeo Plenty years ago.  As recently as 08/10/2016 he had a redo right femoral artery exposure with iliofemoral endarterectomy bovine patch and redo right limb of his femorofemoral bypass by Dr. Trula Slade.  On 09/19/2016 he then had ultrasound-guided access of left brachial artery with angioplasty of the right common femoral artery, stent of the right external iliac, and stent of the right common femoral artery also by Dr. Trula Slade.  I then performed right common iliac artery angioplasty and stenting with a 7 x 47 VBX as well as a drug-coated balloon angioplasty of the right external iliac artery on 09/22/21 for a recurrent high grade stenosis.   On last follow-up he had a increasing stenosis in the distal abdominal aorta proximal to his iliac stents.  He has a velocity of 537.  There was also concern for high-grade stenosis in the proximal right to left femorofemoral bypass.  I had recommended CTA abdomen pelvis with runoff to evaluate what options we had.  This had to be canceled end of last week as he had evidence of creatinine 2.5 with GFR of now 26.  He has known CKD but 9 months ago his creatinine was 1.6.  Him and his wife seem quite surprised by his worsening kidney numbers.  They state they have never seen a kidney doctor.  Most of his care has been at the [place].  I have offered to send him to [facility] here for another opinion as far as his kidney disease.  Discussed we will cancel his CT and plan aortogram, lower extremity arteriogram, with possible intervention from a femorofemoral approach to try and maintain long-term patency of his right iliac stents as well as his femorofemoral bypass in the Cath Lab and try and use CO2 for is much as possible.  I will delay this until after he had a chance to see one of the nephrologist.   [name], MD [facility]: [phone]

## 2022-10-05 NOTE — Progress Notes (Signed)
Pt ambulated to and from bathroom with no signs of oozing form groin site

## 2022-10-05 NOTE — Op Note (Signed)
    Patient name: [name] MRN: [id number] DOB: 04-09-1946 Sex: male  10/05/2022 Pre-operative Diagnosis: Right to left femoral-femoral bypass with right iliac stenting with concern for high-grade stenosis in the infrarenal aorta and inflow of the femorofemoral bypass in the right groin Post-operative diagnosis:  Same Surgeon:  [name], MD Procedure Performed: 1.  Ultrasound-guided access right to left femoral-femoral bypass 2.  Aortogram with catheter selection of aorta 3.  28 minutes of monitored moderate conscious sedation time  Indications: Patient is a 77 year old male who has extensive history including right to left femoral-femoral bypass as well as right iliac stenting.  Recent surveillance suggested high-grade stenosis in the distal abdominal aorta as well as the inflow of his femoral-femoral bypass.  I initially planned to get a CT scan but due to his chronic kidney disease we could not get a contrasted study.  He presents today for aortogram for evaluation of the distal abdominal aorta and right iliac stents and femorofemoral bypass after risks benefits discussed.  Findings:   Ultrasound-guided access right to left femoral-femoral bypass retrograde.  I was able to get a catheter into the aorta through the right iliac stents which was the inflow for his bypass.  Initially performed CO2 aortogram but due to significant bowel gas we had hard time visualizing the stents.  We did a very low volume contrast of 12 mL to evaluate the distal abdominal aorta, right iliac stents, as well as the right to left femorofemoral bypass.  We did not visualize any significant flow-limiting stenosis with brisk flow in the distal abdominal aorta down the right iliac stents and through the femorofemoral bypass.  Profunda runoff in both groins.  No intervention performed.   Procedure:  The patient was identified in the holding area and taken to room 2.  The patient was then placed supine on the  table and prepped and draped in the usual sterile fashion.  A time out was called.  Patient received Versed and fentanyl for moderate conscious sedation.  Vital signs were monitored including heart rate, respiratory, oxygenation and blood pressure.  I was present for all of moderate sedation.  Ultrasound was used to evaluate the right to left femorofemoral bypass above the pubis.  This was patent and image was saved.  I accessed this under ultrasound guidance with a micro access needle and placed a microwire and then a microsheath.  I then advanced a Bentson wire retrograde and upsized to a 5 [place] sheath.  The patient was given 5000 units of IV heparin.  I then used a BER 2 catheter to guide my wire up into the right iliac stents retrograde and put catheter in the aorta.  Aortogram was obtained with CO2 and we tried to look at this in multiple views due to overlying bowel gas we really could not get a good image.  We ultimately did a contrasted aortogram with 12 mL.  Again no significant stenosis was identified in the distal abdominal aorta, right iliac stents or femorofemoral bypass.  He only has profunda runoff bilaterally.  Wires and catheters were removed.  The sheath in the femorofemoral bypass was removed and manual pressure was held.  Plan: Continue aspirin statin Plavix.  Will arrange follow-up in 3 months.  No intervention performed given no high-grade stenosis identified.  [name], MD [facility] Office: [phone]

## 2022-10-06 ENCOUNTER — Encounter (HOSPITAL_COMMUNITY): Payer: Self-pay | Admitting: Vascular Surgery

## 2022-10-06 MED FILL — Midazolam HCl Inj 5 MG/5ML (Base Equivalent): INTRAMUSCULAR | Qty: 1 | Status: AC

## 2022-11-22 ENCOUNTER — Encounter: Payer: Self-pay | Admitting: Gastroenterology

## 2022-11-22 ENCOUNTER — Ambulatory Visit (INDEPENDENT_AMBULATORY_CARE_PROVIDER_SITE_OTHER): Payer: Medicare HMO | Admitting: Gastroenterology

## 2022-11-22 VITALS — BP 160/60 | HR 69 | Temp 98.0°F | Ht 66.5 in | Wt 152.6 lb

## 2022-11-22 DIAGNOSIS — Z86010 Personal history of colonic polyps: Secondary | ICD-10-CM

## 2022-11-22 DIAGNOSIS — K219 Gastro-esophageal reflux disease without esophagitis: Secondary | ICD-10-CM

## 2022-11-22 NOTE — Progress Notes (Signed)
GI Office Note    Referring Provider: Garey Hamassada, Peggy, MD Primary Care Physician:  Garey Hamassada, Peggy, MD  Primary Gastroenterologist: Roetta SessionsMichael Rourk, MD   Chief Complaint   Chief Complaint  Patient presents with   Gastroesophageal Reflux    Issues with reflux, currently taking pantoprazole once daily and states that he still has some issues with reflux.    History of Present Illness   [name] is a 77 y.o. male presenting today for follow-up.  Last seen in August 2023.  Seen at that time for nonbloody watery diarrhea for several weeks.  At the time of the office visit his symptoms have resolved.  Reported negative stool studies.  GI history significant for complicated diverticulitis requiring diverting colostomy (May 2019).  He had a colonoscopy through his colostomy and proctoscopy showing small adenoma which was removed, slated for surveillance colonoscopy in 2024.  Subsequently had colostomy reversal (August 2019).  He has longstanding GERD.  EGD in 2019 with small hiatal hernia.  At his last visit GERD was felt to be poorly controlled.  Pantoprazole was increased from 20 mg to 40 mg daily.  Patient states he actually continued his pantoprazole 20 mg after supper and started pantoprazole 40 mg before breakfast.  He continued to have nocturnal reflux so he switched the medicines and now taking 20 mg before breakfast and 40 mg after supper.  He admits that he goes to bed pretty early.  Typically reclining shortly after eating supper.  Goes to bed around 8 or 9:00.  Wakes up around 1130 with burning in the throat, coughing and strangling.  Sometimes has to vomit.  Sometimes has to get up and sleep in the recliner.  Will try milk or water to calm things down.  Denies dysphagia, hematemesis, abdominal pain.  Denies any daytime heartburn.  Bowel movements regular.  No blood in the stool or melena.  No unintentional weight loss.     Medications   Current Outpatient Medications  Medication  Sig Dispense Refill   amLODipine (NORVASC) 10 MG tablet Take 10 mg by mouth in the morning.     aspirin EC 81 MG tablet Take 1 tablet (81 mg total) by mouth daily with breakfast. 30 tablet 2   atorvastatin (LIPITOR) 40 MG tablet Take 1 tablet (40 mg total) by mouth daily at 6 PM. (Patient taking differently: Take 40 mg by mouth at bedtime.) 30 tablet 1   cilostazol (PLETAL) 100 MG tablet Take 100 mg by mouth 2 (two) times daily.     clopidogrel (PLAVIX) 75 MG tablet Take 75 mg by mouth in the morning.     empagliflozin (JARDIANCE) 25 MG TABS tablet Take 12.5 mg by mouth in the morning.     hydrochlorothiazide (HYDRODIURIL) 12.5 MG tablet Take 12.5 mg by mouth in the morning.     insulin glargine (LANTUS) 100 UNIT/ML injection Inject 20 Units into the skin at bedtime.     losartan (COZAAR) 50 MG tablet Take 50 mg by mouth daily.     metFORMIN (GLUCOPHAGE) 1000 MG tablet Take 1,000 mg by mouth 2 (two) times daily with a meal.      metoprolol tartrate (LOPRESSOR) 50 MG tablet Take 25 mg by mouth 2 (two) times daily.      pantoprazole (PROTONIX) 20 MG tablet Take 20 mg by mouth daily before breakfast.     pantoprazole (PROTONIX) 40 MG tablet Take 1 tablet (40 mg total) by mouth daily. 90 tablet 3  pregabalin (LYRICA) 75 MG capsule Take 75 mg by mouth 2 (two) times daily.     Semaglutide,0.25 or 0.5MG /DOS, (OZEMPIC, 0.25 OR 0.5 MG/DOSE,) 2 MG/1.5ML SOPN Inject 0.5 mg into the skin every Sunday. Sunday night     vitamin B-12 (CYANOCOBALAMIN) 1000 MCG tablet Take 1,000 mcg by mouth in the morning.     No current facility-administered medications for this visit.    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   x 5   Hypercholesteremia    Hypertension    Neuropathy    Peripheral vascular disease    s/p R-L FEM-FEM BYPASS   Pneumonia    Tachycardia    after CABG, pt on medicine for this    Past Surgical History   Past Surgical History:  Procedure Laterality Date   ABDOMINAL AORTOGRAM Bilateral 09/19/2016   Procedure: iliac;  Surgeon: Nada Libman, MD;  Location: [facility];  Service: Cardiovascular;  Laterality: Bilateral;   ABDOMINAL AORTOGRAM W/LOWER EXTREMITY N/A 09/22/2021   Procedure: ABDOMINAL AORTOGRAM W/LOWER EXTREMITY;  Surgeon: Cephus Shelling, MD;  Location: [facility];  Service: Cardiovascular;  Laterality: N/A;   ABDOMINAL AORTOGRAM W/LOWER EXTREMITY N/A 10/05/2022   Procedure: ABDOMINAL AORTOGRAM W/LOWER EXTREMITY;  Surgeon: Cephus Shelling, MD;  Location: [facility];  Service: Cardiovascular;  Laterality: N/A;   CARDIAC CATHETERIZATION  2003   with stent   CARDIAC CATHETERIZATION N/A 10/11/2015   Procedure: Left Heart Cath and Coronary Angiography;  Surgeon: Peter M Swaziland, MD;  Location: [facility];  Service: Cardiovascular;  Laterality: N/A;   COLON RESECTION N/A 12/13/2017   Procedure: EXPLORATORY LAPAROTOMY, SIGMOID COLECTOMY WITH COLOSTOMY;  Surgeon: Manus Rudd, MD;  Location: [facility] OR;  Service: General;  Laterality: N/A;   COLONOSCOPY N/A 09/22/2013   Procedure: COLONOSCOPY;  Surgeon: Corbin Ade, MD;  Location: [facility];  Service: Endoscopy;  Laterality: N/A;  9:30 AM   COLONOSCOPY N/A 02/19/2018   Procedure: COLONOSCOPY;  Surgeon: Corbin Ade, MD;  Location: [facility];  Service: Endoscopy;  Laterality: N/A;  2:00pm   COLOSTOMY N/A 12/13/2017   Procedure: COLOSTOMY;  Surgeon: Manus Rudd, MD;  Location: [facility] OR;  Service: General;  Laterality: N/A;   COLOSTOMY REVERSAL N/A 03/26/2018    Procedure: COLOSTOMY REVERSAL;  Surgeon: Manus Rudd, MD;  Location: [facility] OR;  Service: General;  Laterality: N/A;   CORONARY ARTERY BYPASS GRAFT N/A 10/18/2015   Procedure: CORONARY ARTERY BYPASS GRAFTING (CABG) x  five, using left internal mammary artery and right leg greater saphenous vein harvested endoscopically;  Surgeon: Loreli Slot, MD;  Location: [facility] OR;  Service: Open Heart Surgery;  Laterality: N/A;   ENDARTERECTOMY FEMORAL Right 08/09/2016   Procedure: ENDARTERECTOMY FEMORAL WITH VEIN PATCH ANGIOPLASTY;  Surgeon: Nada Libman, MD;  Location: [facility] OR;  Service: Vascular;  Laterality: Right;   ESOPHAGOGASTRODUODENOSCOPY N/A 10/24/2017   Dr. Jena Gauss: hiatal hernia   FEMORAL-FEMORAL BYPASS GRAFT Bilateral 08/09/2016   Procedure: REVISION BYPASS GRAFT RIGHT FEMORAL-LEFT FEMORAL  ARTERY;  Surgeon: Nada Libman, MD;  Location: [facility] OR;  Service: Vascular;  Laterality: Bilateral;   FEMORAL-POPLITEAL BYPASS GRAFT     INTRAMEDULLARY (IM) NAIL INTERTROCHANTERIC Left 04/03/2021   Procedure: LEFT  HIP INTRAMEDULLARY (IM) NAIL INTERTROCHANTRIC;  Surgeon: Kathryne Hitch, MD;  Location: [facility];  Service: Orthopedics;  Laterality: Left;   PERIPHERAL VASCULAR BALLOON ANGIOPLASTY Right 09/22/2021   Procedure: PERIPHERAL VASCULAR BALLOON ANGIOPLASTY;  Surgeon: Cephus Shelling, MD;  [facility]: [facility];  Service: Cardiovascular;  Laterality: Right;   PERIPHERAL VASCULAR CATHETERIZATION N/A 05/08/2016   Procedure: Lower Extremity Angiography;  Surgeon: Runell Gess, MD;  Location: [facility] INVASIVE CV LAB;  Service: Cardiovascular;  Laterality: N/A;   PERIPHERAL VASCULAR INTERVENTION Right 09/19/2016   Procedure: Peripheral Vascular Intervention;  Surgeon: Nada Libman, MD;  Location: [facility];  Service: Cardiovascular;  Laterality: Right;  ext iliac stent   PERIPHERAL VASCULAR INTERVENTION Right 09/22/2021   Procedure: PERIPHERAL VASCULAR INTERVENTION;  Surgeon: Cephus Shelling, MD;  Location: [facility];  Service: Cardiovascular;  Laterality: Right;   POLYPECTOMY  02/19/2018   Procedure: POLYPECTOMY;  Surgeon: Corbin Ade, MD;  [facility];  Service: Endoscopy;;   PROCTOSCOPY  10/24/2017   Procedure: PROCTOSCOPY;  Surgeon: Corbin Ade, MD;  [facility]: [facility];  Service: Endoscopy;;   TEE WITHOUT CARDIOVERSION N/A 10/18/2015   Procedure: TRANSESOPHAGEAL ECHOCARDIOGRAM (TEE);  Surgeon: Loreli Slot, MD;  Location: [facility] OR;  Service: Open Heart Surgery;  Laterality: N/A;    Past Family History   Family History  Problem Relation Age of Onset   Heart attack Mother    Stroke Mother    Colon cancer Brother        late 51s    Past Social History   Social History   Socioeconomic History   Marital status: Married    Spouse name: Not on file   Number of children: Not on file   Years of education: Not on file   Highest education level: Not on file  Occupational History   Not on file  Tobacco Use   Smoking status: Former    Packs/day: 2.00    Years: 30.00    Additional pack years: 0.00    Total pack years: 60.00    Types: Cigarettes    Quit date: 08/14/2000    Years since quitting: 22.2   Smokeless tobacco: Never  Vaping Use   Vaping Use: Never used  Substance and Sexual Activity   Alcohol use: No   Drug use: No   Sexual activity: Not Currently  Other Topics Concern   Not on file  Social History Narrative   Not on file   Social Determinants of Health   Financial Resource Strain: Not on file  Food Insecurity: Not on file  Transportation Needs: Not on file  Physical Activity: Not on file  Stress: Not on file  Social Connections: Not on file  Intimate Partner Violence: Not on file    Review of Systems   General: Negative for anorexia, weight loss, fever, chills, fatigue, weakness. ENT: Negative for hoarseness, difficulty swallowing , nasal congestion. CV: Negative for chest pain, angina,  palpitations, dyspnea on exertion, peripheral edema.  Respiratory: Negative for dyspnea at rest, dyspnea on exertion, cough, sputum, wheezing.  GI: See history of present illness. GU:  Negative for dysuria, hematuria, urinary incontinence, urinary frequency, nocturnal urination.  Endo: Negative for unusual weight change.  Physical Exam   BP (!) 160/60 (BP Location: Right Arm, Patient Position: Sitting, Cuff Size: Normal)   Pulse 69   Temp 98 F (36.7 C) (Oral)   Ht 5' 6.5" (1.689 m)   Wt 152 lb 9.6 oz (69.2 kg)   SpO2 97%   BMI 24.26 kg/m    General: Well-nourished, well-developed in no acute distress.  Eyes: No icterus. Mouth: Oropharyngeal mucosa moist and pink   Lungs: Clear to auscultation bilaterally.  Heart: Regular rate and rhythm, no murmurs rubs or gallops.  Abdomen: Bowel sounds are normal, nontender, nondistended, no hepatosplenomegaly or masses,  no abdominal bruits or hernia , no rebound or guarding.  Rectal: Not performed Extremities: No lower extremity edema. No clubbing or deformities. Neuro: Alert and oriented x 4   Skin: Warm and dry, no jaundice.   Psych: Alert and cooperative, normal mood and affect.  Labs   None available Imaging Studies   No results found.  Assessment   GERD: continues to have nocturnal symptoms. Likely in part due to eating late, lying down shortly after meal, and taking his PPI after supper.  We discussed proceeding with endoscopy at this time versus reinforcing antireflux measures, taking his pantoprazole BEFORE his evening meal to improve efficacy.  Patient elected to make some changes in GERD regimen first.   H/O adenomatous colon polyps: due for surveillance exam.  The patient was found to have elevated blood pressure when vital signs were checked in the office. The blood pressure was rechecked by the nursing staff and it was found be persistently elevated >140/90 mmHg. I personally advised to the patient to follow up closely  with his PCP for hypertension control.   PLAN   Continue pantoprazole 20 mg 30 minutes before breakfast. Take pantoprazole 40 mg 30 minutes before supper instead of after supper. Now at least 2 hours between eating and laying down. Eat smaller portions especially in the evenings. Call with a progress report in 3 weeks, if reflux still poorly controlled, will offer upper endoscopy at time of colonoscopy.  If reflux is better, then proceed with colonoscopy. Reinforced other antireflux measures, handout provided.   Leanna Battles. Melvyn Neth, MHS, PA-C [facility]

## 2022-11-22 NOTE — Patient Instructions (Signed)
Continue pantoprazole 20mg  30 minutes before breakfast.  Take your pantoprazole 40mg  30 minutes before supper. Try to allow at least 2 hours between eating and laying down to prevent reflux at night.  Try to eat smaller portions at your last meal of the day.  In three weeks, please call my CMA, Tammy at [phone] and let me know if you reflux is doing any better or not. If you are doing well, we will move forward with your colonoscopy. If your reflux is still uncontrolled, we will plan for upper endoscopy at time of your colonoscopy.  Call with any questions or concerns.   Food Choices for Gastroesophageal Reflux Disease, Adult When you have gastroesophageal reflux disease (GERD), the foods you eat and your eating habits are very important. Choosing the right foods can help ease the discomfort of GERD. Consider working with a dietitian to help you make healthy food choices. What are tips for following this plan? Reading food labels Look for foods that are low in saturated fat. Foods that have less than 5% of daily value (DV) of fat and 0 g of trans fats may help with your symptoms. Cooking Cook foods using methods other than frying. This may include baking, steaming, grilling, or broiling. These are all methods that do not need a lot of fat for cooking. To add flavor, try to use herbs that are low in spice and acidity. Meal planning  Choose healthy foods that are low in fat, such as fruits, vegetables, whole grains, low-fat dairy products, lean meats, fish, and poultry. Eat frequent, small meals instead of three large meals each day. Eat your meals slowly, in a relaxed setting. Avoid bending over or lying down until 2-3 hours after eating. Limit high-fat foods such as fatty meats or fried foods. Limit your intake of fatty foods, such as oils, butter, and shortening. Avoid the following as told by your health care provider: Foods that cause symptoms. These may be different for different  people. Keep a food diary to keep track of foods that cause symptoms. Alcohol. Drinking large amounts of liquid with meals. Eating meals during the 2-3 hours before bed. Lifestyle Maintain a healthy weight. Ask your health care provider what weight is healthy for you. If you need to lose weight, work with your health care provider to do so safely. Exercise for at least 30 minutes on 5 or more days each week, or as told by your health care provider. Avoid wearing clothes that fit tightly around your waist and chest. Do not use any products that contain nicotine or tobacco. These products include cigarettes, chewing tobacco, and vaping devices, such as e-cigarettes. If you need help quitting, ask your health care provider. Sleep with the head of your bed raised. Use a wedge under the mattress or blocks under the bed frame to raise the head of the bed. Chew sugar-free gum after mealtimes. What foods should I eat?  Eat a healthy, well-balanced diet of fruits, vegetables, whole grains, low-fat dairy products, lean meats, fish, and poultry. Each person is different. Foods that may trigger symptoms in one person may not trigger any symptoms in another person. Work with your health care provider to identify foods that are safe for you. The items listed above may not be a complete list of recommended foods and beverages. Contact a dietitian for more information. What foods should I avoid? Limiting some of these foods may help manage the symptoms of GERD. Everyone is different. Consult a dietitian or  your health care provider to help you identify the exact foods to avoid, if any. Fruits Any fruits prepared with added fat. Any fruits that cause symptoms. For some people this may include citrus fruits, such as oranges, grapefruit, pineapple, and lemons. Vegetables Deep-fried vegetables. [place] fries. Any vegetables prepared with added fat. Any vegetables that cause symptoms. For some people, this may include  tomatoes and tomato products, chili peppers, onions and garlic, and horseradish. Grains Pastries or quick breads with added fat. Meats and other proteins High-fat meats, such as fatty beef or pork, hot dogs, ribs, ham, sausage, salami, and bacon. Fried meat or protein, including fried fish and fried chicken. Nuts and nut butters, in large amounts. Dairy Whole milk and chocolate milk. Sour cream. Cream. Ice cream. Cream cheese. Milkshakes. Fats and oils Butter. Margarine. Shortening. Ghee. Beverages Coffee and tea, with or without caffeine. Carbonated beverages. Sodas. Energy drinks. Fruit juice made with acidic fruits, such as orange or grapefruit. Tomato juice. Alcoholic drinks. Sweets and desserts Chocolate and cocoa. Donuts. Seasonings and condiments Pepper. Peppermint and spearmint. Added salt. Any condiments, herbs, or seasonings that cause symptoms. For some people, this may include curry, hot sauce, or vinegar-based salad dressings. The items listed above may not be a complete list of foods and beverages to avoid. Contact a dietitian for more information. Questions to ask your health care provider Diet and lifestyle changes are usually the first steps that are taken to manage symptoms of GERD. If diet and lifestyle changes do not improve your symptoms, talk with your health care provider about taking medicines. Where to find more information [employer]: aboutgerd.org Summary When you have gastroesophageal reflux disease (GERD), food and lifestyle choices may be very helpful in easing the discomfort of GERD. Eat frequent, small meals instead of three large meals each day. Eat your meals slowly, in a relaxed setting. Avoid bending over or lying down until 2-3 hours after eating. Limit high-fat foods such as fatty meats or fried foods. This information is not intended to replace advice given to you by your health care provider. Make sure you  discuss any questions you have with your health care provider. Document Revised: 02/09/2020 Document Reviewed: 02/09/2020 Else[employer] Patient Education  2023 [employer]   Gastroesophageal Reflux Disease, Adult Gastroesophageal reflux (GER) happens when acid from the stomach flows up into the tube that connects the mouth and the stomach (esophagus). Normally, food travels down the esophagus and stays in the stomach to be digested. However, when a person has GER, food and stomach acid sometimes move back up into the esophagus. If this becomes a more serious problem, the person may be diagnosed with a disease called gastroesophageal reflux disease (GERD). GERD occurs when the reflux: Happens often. Causes frequent or severe symptoms. Causes problems such as damage to the esophagus. When stomach acid comes in contact with the esophagus, the acid may cause inflammation in the esophagus. Over time, GERD may create small holes (ulcers) in the lining of the esophagus. What are the causes? This condition is caused by a problem with the muscle between the esophagus and the stomach (lower esophageal sphincter, or LES). Normally, the LES muscle closes after food passes through the esophagus to the stomach. When the LES is weakened or abnormal, it does not close properly, and that allows food and stomach acid to go back up into the esophagus. The LES can be weakened by certain dietary substances, medicines, and medical conditions, including: Tobacco use. Pregnancy.  Having a hiatal hernia. Alcohol use. Certain foods and beverages, such as coffee, chocolate, onions, and peppermint. What increases the risk? You are more likely to develop this condition if you: Have an increased body weight. Have a connective tissue disorder. Take NSAIDs, such as ibuprofen. What are the signs or symptoms? Symptoms of this condition include: Heartburn. Difficult or painful swallowing and the feeling of having a lump in the  throat. A bitter taste in the mouth. Bad breath and having a large amount of saliva. Having an upset or bloated stomach and belching. Chest pain. Different conditions can cause chest pain. Make sure you see your health care provider if you experience chest pain. Shortness of breath or wheezing. Ongoing (chronic) cough or a nighttime cough. Wearing away of tooth enamel. Weight loss. How is this diagnosed? This condition may be diagnosed based on a medical history and a physical exam. To determine if you have mild or severe GERD, your health care provider may also monitor how you respond to treatment. You may also have tests, including: A test to examine your stomach and esophagus with a small camera (endoscopy). A test that measures the acidity level in your esophagus. A test that measures how much pressure is on your esophagus. A barium swallow or modified barium swallow test to show the shape, size, and functioning of your esophagus. How is this treated? Treatment for this condition may vary depending on how severe your symptoms are. Your health care provider may recommend: Changes to your diet. Medicine. Surgery. The goal of treatment is to help relieve your symptoms and to prevent complications. Follow these instructions at home: Eating and drinking  Follow a diet as recommended by your health care provider. This may involve avoiding foods and drinks such as: Coffee and tea, with or without caffeine. Drinks that contain alcohol. Energy drinks and sports drinks. Carbonated drinks or sodas. Chocolate and cocoa. Peppermint and mint flavorings. Garlic and onions. Horseradish. Spicy and acidic foods, including peppers, chili powder, curry powder, vinegar, hot sauces, and barbecue sauce. Citrus fruit juices and citrus fruits, such as oranges, lemons, and limes. Tomato-based foods, such as red sauce, chili, salsa, and pizza with red sauce. Fried and fatty foods, such as donuts, french  fries, potato chips, and high-fat dressings. High-fat meats, such as hot dogs and fatty cuts of red and white meats, such as rib eye steak, sausage, ham, and bacon. High-fat dairy items, such as whole milk, butter, and cream cheese. Eat small, frequent meals instead of large meals. Avoid drinking large amounts of liquid with your meals. Avoid eating meals during the 2-3 hours before bedtime. Avoid lying down right after you eat. Do not exercise right after you eat. Lifestyle  Do not use any products that contain nicotine or tobacco. These products include cigarettes, chewing tobacco, and vaping devices, such as e-cigarettes. If you need help quitting, ask your health care provider. Try to reduce your stress by using methods such as yoga or meditation. If you need help reducing stress, ask your health care provider. If you are overweight, reduce your weight to an amount that is healthy for you. Ask your health care provider for guidance about a safe weight loss goal. General instructions Pay attention to any changes in your symptoms. Take over-the-counter and prescription medicines only as told by your health care provider. Do not take aspirin, ibuprofen, or other NSAIDs unless your health care provider told you to take these medicines. Wear loose-fitting clothing. Do not wear anything  tight around your waist that causes pressure on your abdomen. Raise (elevate) the head of your bed about 6 inches (15 cm). You can use a wedge to do this. Avoid bending over if this makes your symptoms worse. Keep all follow-up visits. This is important. Contact a health care provider if: You have: New symptoms. Unexplained weight loss. Difficulty swallowing or it hurts to swallow. Wheezing or a persistent cough. A hoarse voice. Your symptoms do not improve with treatment. Get help right away if: You have sudden pain in your arms, neck, jaw, teeth, or back. You suddenly feel sweaty, dizzy, or  light-headed. You have chest pain or shortness of breath. You vomit and the vomit is green, yellow, or black, or it looks like blood or coffee grounds. You faint. You have stool that is red, bloody, or black. You cannot swallow, drink, or eat. These symptoms may represent a serious problem that is an emergency. Do not wait to see if the symptoms will go away. Get medical help right away. Call your local emergency services (911 in the [place] Do not drive yourself to the hospital. Summary Gastroesophageal reflux happens when acid from the stomach flows up into the esophagus. GERD is a disease in which the reflux happens often, causes frequent or severe symptoms, or causes problems such as damage to the esophagus. Treatment for this condition may vary depending on how severe your symptoms are. Your health care provider may recommend diet and lifestyle changes, medicine, or surgery. Contact a health care provider if you have new or worsening symptoms. Take over-the-counter and prescription medicines only as told by your health care provider. Do not take aspirin, ibuprofen, or other NSAIDs unless your health care provider told you to do so. Keep all follow-up visits as told by your health care provider. This is important. This information is not intended to replace advice given to you by your health care provider. Make sure you discuss any questions you have with your health care provider. Document Revised: 02/09/2020 Document Reviewed: 02/09/2020 Else[employer] Patient Education  2023 [employer]

## 2022-12-28 ENCOUNTER — Other Ambulatory Visit: Payer: Self-pay | Admitting: *Deleted

## 2022-12-28 DIAGNOSIS — I739 Peripheral vascular disease, unspecified: Secondary | ICD-10-CM

## 2023-01-04 ENCOUNTER — Encounter: Payer: Self-pay | Admitting: *Deleted

## 2023-01-09 ENCOUNTER — Ambulatory Visit (INDEPENDENT_AMBULATORY_CARE_PROVIDER_SITE_OTHER): Payer: No Typology Code available for payment source | Admitting: Physician Assistant

## 2023-01-09 ENCOUNTER — Ambulatory Visit (HOSPITAL_COMMUNITY)
Admission: RE | Admit: 2023-01-09 | Discharge: 2023-01-09 | Disposition: A | Discharge status: Home / Self Care | Payer: No Typology Code available for payment source | Source: Ambulatory Visit | Attending: Vascular Surgery | Admitting: Vascular Surgery

## 2023-01-09 VITALS — BP 172/74 | Temp 98.3°F | Resp 16 | Ht 67.0 in | Wt 151.0 lb

## 2023-01-09 DIAGNOSIS — I739 Peripheral vascular disease, unspecified: Secondary | ICD-10-CM

## 2023-01-09 LAB — VAS US ABI WITH/WO TBI
Left ABI: 0.84
Right ABI: 0.9

## 2023-01-09 NOTE — Progress Notes (Signed)
Office Note     CC:  follow up Requesting Provider:  Garey Ham, MD  HPI: [name] is a 77 y.o. ([birth year]) male who presents for routine follow up of PAD. He has remote history of fem- fem bypass by Dr. Madilyn Fireman. He has had several interventions mostly on RLE since.  His procedures are as follows: redo right femoral artery exposure with iliofemoral endarterectomy with bovine patch and redo right limb of the fem-fem bypass by Dr. Myra Gianotti in December of 2017 angioplasty and stenting of the right CF and external iliac artery by Dr. Myra Gianotti February 2018 right common  angioplasty and stenting and external iliac artery angioplasty by Dr. Chestine Spore for recurrent stenosis February 2023  He most recently underwent a diagnostic Aortogram. There was some concern for recurrent inflow disease on duplex. However, on imaging there was no visualized flow limiting stenosis with brisk flow in the abdominal aorta and into the right iliac stents and fem-fem bypass so no intervention was indicated.   He returns today for follow up with his wife with non invasive studies. He denies any pain on ambulation or rest. No tissue loss. He admittedly does not do a lot of walking. He says if he does walk a far distance or up any incline like stairs he gets tightness in his calves. This resolves with rest. His wife says that he is very unbalances when walking and has had several falls however he chooses to not use any assistive device to help him ambulate.  He is compliant with his Aspirin, Statin, and Plavix.  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   Hypercholesteremia    Hypertension     Neuropathy    Peripheral vascular disease (HCC)    s/p R-L FEM-FEM BYPASS   Pneumonia    Tachycardia    after CABG, pt on medicine for this    Past Surgical History:  Procedure Laterality Date   ABDOMINAL AORTOGRAM Bilateral 09/19/2016   Procedure: iliac;  Surgeon: Nada Libman, MD;  Location: [facility];  Service: Cardiovascular;  Laterality: Bilateral;   ABDOMINAL AORTOGRAM W/LOWER EXTREMITY N/A 09/22/2021   Procedure: ABDOMINAL AORTOGRAM W/LOWER EXTREMITY;  Surgeon: Cephus Shelling, MD;  Location: [facility];  Service: Cardiovascular;  Laterality: N/A;   ABDOMINAL AORTOGRAM W/LOWER EXTREMITY N/A 10/05/2022   Procedure: ABDOMINAL AORTOGRAM W/LOWER EXTREMITY;  Surgeon: Cephus Shelling, MD;  Location: [facility];  Service: Cardiovascular;  Laterality: N/A;   CARDIAC CATHETERIZATION  2003   with stent   CARDIAC CATHETERIZATION N/A 10/11/2015   Procedure: Left Heart Cath and Coronary Angiography;  Surgeon: Peter M Swaziland, MD;  Location: [facility];  Service: Cardiovascular;  Laterality: N/A;   COLON RESECTION N/A 12/13/2017   Procedure: EXPLORATORY LAPAROTOMY, SIGMOID COLECTOMY WITH COLOSTOMY;  Surgeon: Manus Rudd, MD;  Location: [facility];  Service: General;  Laterality: N/A;   COLONOSCOPY N/A 09/22/2013   Procedure: COLONOSCOPY;  Surgeon: Corbin Ade, MD;  Location: [facility];  Service: Endoscopy;  Laterality: N/A;  9:30 AM   COLONOSCOPY N/A 02/19/2018   Procedure: COLONOSCOPY;  Surgeon:  Rourk, Gerrit Friends, MD;  Location: [facility];  Service: Endoscopy;  Laterality: N/A;  2:00pm   COLOSTOMY N/A 12/13/2017   Procedure: COLOSTOMY;  Surgeon: Manus Rudd, MD;  Location: [facility] OR;  Service: General;  Laterality: N/A;   COLOSTOMY REVERSAL N/A 03/26/2018   Procedure: COLOSTOMY REVERSAL;  Surgeon: Manus Rudd, MD;  Location: [facility];  Service: General;  Laterality: N/A;   CORONARY ARTERY BYPASS GRAFT N/A 10/18/2015   Procedure: CORONARY ARTERY BYPASS  GRAFTING (CABG) x  five, using left internal mammary artery and right leg greater saphenous vein harvested endoscopically;  Surgeon: Loreli Slot, MD;  Location: [facility];  Service: Open Heart Surgery;  Laterality: N/A;   ENDARTERECTOMY FEMORAL Right 08/09/2016   Procedure: ENDARTERECTOMY FEMORAL WITH VEIN PATCH ANGIOPLASTY;  Surgeon: Nada Libman, MD;  Location: [facility];  Service: Vascular;  Laterality: Right;   ESOPHAGOGASTRODUODENOSCOPY N/A 10/24/2017   Dr. Jena Gauss: hiatal hernia   FEMORAL-FEMORAL BYPASS GRAFT Bilateral 08/09/2016   Procedure: REVISION BYPASS GRAFT RIGHT FEMORAL-LEFT FEMORAL ARTERY;  Surgeon: Nada Libman, MD;  Location: [facility] OR;  Service: Vascular;  Laterality: Bilateral;   FEMORAL-POPLITEAL BYPASS GRAFT     INTRAMEDULLARY (IM) NAIL INTERTROCHANTERIC Left 04/03/2021   Procedure: LEFT  HIP INTRAMEDULLARY (IM) NAIL INTERTROCHANTRIC;  Surgeon: Kathryne Hitch, MD;  Location: [facility];  Service: Orthopedics;  Laterality: Left;   PERIPHERAL VASCULAR BALLOON ANGIOPLASTY Right 09/22/2021   Procedure: PERIPHERAL VASCULAR BALLOON ANGIOPLASTY;  Surgeon: Cephus Shelling, MD;  [facility]: [facility];  Service: Cardiovascular;  Laterality: Right;   PERIPHERAL VASCULAR CATHETERIZATION N/A 05/08/2016   Procedure: Lower Extremity Angiography;  Surgeon: Runell Gess, MD;  Location: [facility] INVASIVE CV LAB;  Service: Cardiovascular;  Laterality: N/A;   PERIPHERAL VASCULAR INTERVENTION Right 09/19/2016   Procedure: Peripheral Vascular Intervention;  Surgeon: Nada Libman, MD;  Location: [facility];  Service: Cardiovascular;  Laterality: Right;  ext iliac stent   PERIPHERAL VASCULAR INTERVENTION Right 09/22/2021   Procedure: PERIPHERAL VASCULAR INTERVENTION;  Surgeon: Cephus Shelling, MD;  [facility]: [facility];  Service: Cardiovascular;  Laterality: Right;   POLYPECTOMY  02/19/2018   Procedure: POLYPECTOMY;  Surgeon: Corbin Ade, MD;  [facility];   Service: Endoscopy;;   PROCTOSCOPY  10/24/2017   Procedure: PROCTOSCOPY;  Surgeon: Corbin Ade, MD;  [facility]: [facility];  Service: Endoscopy;;   TEE WITHOUT CARDIOVERSION N/A 10/18/2015   Procedure: TRANSESOPHAGEAL ECHOCARDIOGRAM (TEE);  Surgeon: Loreli Slot, MD;  Location: [facility] OR;  Service: Open Heart Surgery;  Laterality: N/A;    Social History   Socioeconomic History   Marital status: Married    Spouse name: Not on file   Number of children: Not on file   Years of education: Not on file   Highest education level: Not on file  Occupational History   Not on file  Tobacco Use   Smoking status: Former    Packs/day: 2.00    Years: 30.00    Additional pack years: 0.00    Total pack years: 60.00    Types: Cigarettes    Quit date: 08/14/2000    Years since quitting: 22.4   Smokeless tobacco: Never  Vaping Use   Vaping Use: Never used  Substance and Sexual Activity   Alcohol use: No   Drug use: No   Sexual activity: Not Currently  Other Topics Concern   Not on file  Social History Narrative   Not on file  Social Determinants of Health   Financial Resource Strain: Not on file  Food Insecurity: Not on file  Transportation Needs: Not on file  Physical Activity: Not on file  Stress: Not on file  Social Connections: Not on file  Intimate Partner Violence: Not on file    Family History  Problem Relation Age of Onset   Heart attack Mother    Stroke Mother    Colon cancer Brother        late 37s    Current Outpatient Medications  Medication Sig Dispense Refill   amLODipine (NORVASC) 10 MG tablet Take 10 mg by mouth in the morning.     aspirin EC 81 MG tablet Take 1 tablet (81 mg total) by mouth daily with breakfast. 30 tablet 2   atorvastatin (LIPITOR) 40 MG tablet Take 1 tablet (40 mg total) by mouth daily at 6 PM. (Patient taking differently: Take 40 mg by mouth at bedtime.) 30 tablet 1   cilostazol (PLETAL) 100 MG tablet Take 100 mg by mouth 2 (two)  times daily.     clopidogrel (PLAVIX) 75 MG tablet Take 75 mg by mouth in the morning.     empagliflozin (JARDIANCE) 25 MG TABS tablet Take 12.5 mg by mouth in the morning.     hydrochlorothiazide (HYDRODIURIL) 12.5 MG tablet Take 12.5 mg by mouth in the morning.     insulin glargine (LANTUS) 100 UNIT/ML injection Inject 20 Units into the skin at bedtime.     losartan (COZAAR) 50 MG tablet Take 50 mg by mouth daily.     metFORMIN (GLUCOPHAGE) 1000 MG tablet Take 1,000 mg by mouth 2 (two) times daily with a meal.      metoprolol tartrate (LOPRESSOR) 50 MG tablet Take 25 mg by mouth 2 (two) times daily.      pantoprazole (PROTONIX) 20 MG tablet Take 20 mg by mouth daily before breakfast.     pantoprazole (PROTONIX) 40 MG tablet Take 1 tablet (40 mg total) by mouth daily. 90 tablet 3   pregabalin (LYRICA) 75 MG capsule Take 75 mg by mouth 2 (two) times daily.     Semaglutide,0.25 or 0.5MG /DOS, (OZEMPIC, 0.25 OR 0.5 MG/DOSE,) 2 MG/1.5ML SOPN Inject 0.5 mg into the skin every Sunday. Sunday night     vitamin B-12 (CYANOCOBALAMIN) 1000 MCG tablet Take 1,000 mcg by mouth in the morning.     No current facility-administered medications for this visit.    No Known Allergies   REVIEW OF SYSTEMS:  [X]  denotes positive finding, [ ]  denotes negative finding Cardiac  Comments:  Chest pain or chest pressure:    Shortness of breath upon exertion:    Short of breath when lying flat:    Irregular heart rhythm:        Vascular    Pain in calf, thigh, or hip brought on by ambulation:    Pain in feet at night that wakes you up from your sleep:     Blood clot in your veins:    Leg swelling:         Pulmonary    Oxygen at home:    Productive cough:     Wheezing:         Neurologic    Sudden weakness in arms or legs:     Sudden numbness in arms or legs:     Sudden onset of difficulty speaking or slurred speech:    Temporary loss of vision in one eye:     Problems  with dizziness:          Gastrointestinal    Blood in stool:     Vomited blood:         Genitourinary    Burning when urinating:     Blood in urine:        Psychiatric    Major depression:         Hematologic    Bleeding problems:    Problems with blood clotting too easily:        Skin    Rashes or ulcers:        Constitutional    Fever or chills:      PHYSICAL EXAMINATION:  Vitals:   01/09/23 1052  BP: (!) 172/74  Resp: 16  Temp: 98.3 F (36.8 C)  TempSrc: Temporal  Weight: 151 lb (68.5 kg)  Height: 5\' 7"  (1.702 m)    General:  WDWN in NAD; vital signs documented above Gait: Normal HENT: WNL, normocephalic Pulmonary: normal non-labored breathing , without wheezing Cardiac: regular HR Abdomen: soft, NT, no masses Vascular Exam/Pulses: 2+ femoral pulses,  Extremities: without ischemic changes, without Gangrene , without cellulitis; without open wounds;  Musculoskeletal: no muscle wasting or atrophy  Neurologic: A&O X 3 Psychiatric:  The pt has Normal affect.   Non-Invasive Vascular Imaging:    +-------+-----------+-----------+------------+------------+  ABI/TBIToday's ABIToday's TBIPrevious ABIPrevious TBI  +-------+-----------+-----------+------------+------------+  Right 0.90       0.41       0.61        0.39          +-------+-----------+-----------+------------+------------+  Left  0.84       0.27       Deepstep          0.29          +-------+-----------+-----------+------------+------------+    ASSESSMENT/PLAN:: 77 y.o. male here for follow up for PAD. He has remote history of fem- fem bypass by Dr. Madilyn Fireman. He has had several interventions mostly on RLE since. He most recently underwent a diagnostic Aortogram. There was some concern for recurrent inflow disease on duplex. However, on imaging there was no visualized flow limiting stenosis with brisk flow in the abdominal aorta and into the right iliac stents and fem-fem bypass so no intervention was indicated. He is  without any rest pain, claudication or tissue loss. - ABI today appears increased compared to prior study, overall stable on left leg -Continue Aspirin, statin, Plavix  - He will follow up in 6 months with ABI, Aorto/IV/ Iliac duplex   Graceann Congress, PA-C [facility] [phone]  Clinic MD:   [facility]

## 2023-01-15 ENCOUNTER — Other Ambulatory Visit: Payer: Self-pay

## 2023-01-15 DIAGNOSIS — I739 Peripheral vascular disease, unspecified: Secondary | ICD-10-CM

## 2023-01-15 DIAGNOSIS — I771 Stricture of artery: Secondary | ICD-10-CM

## 2023-02-19 NOTE — Progress Notes (Addendum)
GI Office Note    Referring Provider: Garey Ham, MD Primary Care Physician:  Garey Ham, MD  Primary Gastroenterologist: Roetta Sessions, MD   Chief Complaint   Chief Complaint  Patient presents with   Gastroesophageal Reflux    Issues at night, causes him to vomit    History of Present Illness   [name] is a 77 y.o. male presenting today for follow-up. Last seen 11/2022.   GI history significant for complicated diverticulitis requiring diverting colostomy (May 2019).  In 02/2018, he had a colonoscopy through his colostomy and proctoscopy showing small adenoma which was removed, slated for surveillance colonoscopy in 2024.  Subsequently had colostomy reversal (August 2019).  He has longstanding GERD.  EGD in 2019 with small hiatal hernia.   At time of last office visit he was taking pantoprazole 20 mg before breakfast and 40 mg after supper.  Complaining of waking up around 11:30 at night with burning in the throat, coughing,  all resulting in vomiting.  We discussed possibility of upper endoscopy at that time but he wanted to try switching his pantoprazole before his evening meal prior to deciding about endoscopy.    He presents back today with his wife.  He continues to have frequent nocturnal symptoms.  He is now sleeping in a reclined bed at night.  Typically eats supper around 5 PM.  He lays down around 8:00.  Does not seem to matter what he eats he typically will wake up several hours later initially with a deep cough that results and vomiting.  This can happen several times throughout the night.  He is concerned that he may have pneumonia given his prior history of pneumonia.  During the day he notes infrequent coughing, most of his symptoms are nocturnal.  No shortness of breath.  He was seen by his PCP locally last week, patient states he underwent an ultrasound in their office which was unremarkable.  [facility] and acknowledges that he  needs a follow-up appointment.  He has been having issues with his blood pressure running high and needs to have that addressed as well.  Past 2 weeks he has had diarrhea.  Having 3-5 stools daily.  Incontinent of stool.  When he is laying in bed he will "lose stool" and have to change his underwear several times.  Denies any melena or rectal bleeding.  Denies any abdominal pain.  Denies recent travel, ill contacts, antibiotics or new medication changes.  Patient notes that his daytime reflux seems to be doing okay.  Weight is down about 3 pounds although his weight tends to fluctuate.  CT A/P with and without contrast 04/2022: performed due to possible renal mass seen on u/s -no left renal mass. 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 Bilateral renal scarring. -no bladder mass -extensive and severe atherosclerosis, with high-grade stenosis of both renal arteries, high-grade stenosis of the celiac trunk, and high-grade stenosis and indeterminate occulusion of the SMA. The patient is at risk for chronic mesenteric ischemia.  -suspected occlusion of the left common iliac and external iliac arteries, with a fem-fem bypass in place.  -mild prostatomegaly with some asymmetric enhancement in the right peripheral zone of the prostate gland which could be from prior inflammation of prostate cancer.  -Dependent density in the gallbladder favoring small gallstones over slude.  saw chronic mesenteric ichemia      Wt Readings from Last 10 Encounters:  02/20/23 150 lb 12.8 oz (  68.4 kg)  01/09/23 151 lb (68.5 kg)  11/22/22 152 lb 9.6 oz (69.2 kg)  10/05/22 153 lb (69.4 kg)  08/29/22 152 lb (68.9 kg)  08/15/22 155 lb (70.3 kg)  05/30/22 149 lb (67.6 kg)  03/28/22 148 lb 6.4 oz (67.3 kg)  02/21/22 145 lb (65.8 kg)  01/31/22 147 lb (66.7 kg)   Colonoscopy 02/2018: -few scattered diverticula in entire residual colon, normal appearing residual rectal stump -one 5mm polyp hepatic flexure removed -next colonoscopy  five years  EGD 10/2017: -normal esophagus -small hh   Medications   Current Outpatient Medications  Medication Sig Dispense Refill   amLODipine (NORVASC) 10 MG tablet Take 10 mg by mouth in the morning.     aspirin EC 81 MG tablet Take 1 tablet (81 mg total) by mouth daily with breakfast. 30 tablet 2   atorvastatin (LIPITOR) 40 MG tablet Take 1 tablet (40 mg total) by mouth daily at 6 PM. (Patient taking differently: Take 40 mg by mouth at bedtime.) 30 tablet 1   cilostazol (PLETAL) 100 MG tablet Take 100 mg by mouth 2 (two) times daily.     clopidogrel (PLAVIX) 75 MG tablet Take 75 mg by mouth in the morning.     empagliflozin (JARDIANCE) 25 MG TABS tablet Take 12.5 mg by mouth in the morning.     hydrochlorothiazide (HYDRODIURIL) 12.5 MG tablet Take 12.5 mg by mouth in the morning.     insulin glargine (LANTUS) 100 UNIT/ML injection Inject 20 Units into the skin at bedtime.     losartan (COZAAR) 50 MG tablet Take 50 mg by mouth daily.     metoprolol tartrate (LOPRESSOR) 50 MG tablet Take 25 mg by mouth 2 (two) times daily.      pantoprazole (PROTONIX) 40 MG tablet Take 1 tablet (40 mg total) by mouth daily. 90 tablet 3   pregabalin (LYRICA) 75 MG capsule Take 75 mg by mouth 2 (two) times daily.     vitamin B-12 (CYANOCOBALAMIN) 1000 MCG tablet Take 1,000 mcg by mouth in the morning.     No current facility-administered medications for this visit.    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   Hypercholesteremia    Hypertension    Neuropathy    Peripheral  vascular disease (HCC)    s/p R-L FEM-FEM BYPASS   Pneumonia    Tachycardia    after CABG, pt on medicine for this    Past Surgical History   Past Surgical History:  Procedure Laterality Date   ABDOMINAL AORTOGRAM Bilateral 09/19/2016   Procedure: iliac;  Surgeon: Nada Libman, MD;  Location: [facility];  Service: Cardiovascular;  Laterality: Bilateral;   ABDOMINAL AORTOGRAM W/LOWER EXTREMITY N/A 09/22/2021   Procedure: ABDOMINAL AORTOGRAM W/LOWER EXTREMITY;  Surgeon: Cephus Shelling, MD;  Location: [facility];  Service: Cardiovascular;  Laterality: N/A;   ABDOMINAL AORTOGRAM W/LOWER EXTREMITY N/A 10/05/2022   Procedure: ABDOMINAL AORTOGRAM W/LOWER EXTREMITY;  Surgeon: Cephus Shelling, MD;  Location:  [facility];  Service: Cardiovascular;  Laterality: N/A;   CARDIAC CATHETERIZATION  2003   with stent   CARDIAC CATHETERIZATION N/A 10/11/2015   Procedure: Left Heart Cath and Coronary Angiography;  Surgeon: Peter M Swaziland, MD;  Location: [facility]VASIVE CV LAB;  Service: Cardiovascular;  Laterality: N/A;   COLON RESECTION N/A 12/13/2017   Procedure: EXPLORATORY LAPAROTOMY, SIGMOID COLECTOMY WITH COLOSTOMY;  Surgeon: Manus Rudd, MD;  Location: [facility] OR;  Service: General;  Laterality: N/A;   COLONOSCOPY N/A 09/22/2013   Procedure: COLONOSCOPY;  Surgeon: Corbin Ade, MD;  [facility]: [facility];  Service: Endoscopy;  Laterality: N/A;  9:30 AM   COLONOSCOPY N/A 02/19/2018   Procedure: COLONOSCOPY;  Surgeon: Corbin Ade, MD;  [facility];  Service: Endoscopy;  Laterality: N/A;  2:00pm   COLOSTOMY N/A 12/13/2017   Procedure: COLOSTOMY;  Surgeon: Manus Rudd, MD;  Location: [facility] OR;  Service: General;  Laterality: N/A;   COLOSTOMY REVERSAL N/A 03/26/2018   Procedure: COLOSTOMY REVERSAL;  Surgeon: Manus Rudd, MD;  Location: [facility] OR;  Service: General;  Laterality: N/A;   CORONARY ARTERY BYPASS GRAFT N/A 10/18/2015   Procedure: CORONARY ARTERY BYPASS  GRAFTING (CABG) x  five, using left internal mammary artery and right leg greater saphenous vein harvested endoscopically;  Surgeon: Loreli Slot, MD;  Location: [facility] OR;  Service: Open Heart Surgery;  Laterality: N/A;   ENDARTERECTOMY FEMORAL Right 08/09/2016   Procedure: ENDARTERECTOMY FEMORAL WITH VEIN PATCH ANGIOPLASTY;  Surgeon: Nada Libman, MD;  Location: [facility] OR;  Service: Vascular;  Laterality: Right;   ESOPHAGOGASTRODUODENOSCOPY N/A 10/24/2017   Dr. Jena Gauss: hiatal hernia   FEMORAL-FEMORAL BYPASS GRAFT Bilateral 08/09/2016   Procedure: REVISION BYPASS GRAFT RIGHT FEMORAL-LEFT FEMORAL ARTERY;  Surgeon: Nada Libman, MD;  Location: [facility] OR;  Service: Vascular;  Laterality: Bilateral;   FEMORAL-POPLITEAL BYPASS GRAFT     INTRAMEDULLARY (IM) NAIL INTERTROCHANTERIC Left 04/03/2021   Procedure: LEFT  HIP INTRAMEDULLARY (IM) NAIL INTERTROCHANTRIC;  Surgeon: Kathryne Hitch, MD;  Location: [facility];  Service: Orthopedics;  Laterality: Left;   PERIPHERAL VASCULAR BALLOON ANGIOPLASTY Right 09/22/2021   Procedure: PERIPHERAL VASCULAR BALLOON ANGIOPLASTY;  Surgeon: Cephus Shelling, MD;  [facility];  Service: Cardiovascular;  Laterality: Right;   PERIPHERAL VASCULAR CATHETERIZATION N/A 05/08/2016   Procedure: Lower Extremity Angiography;  Surgeon: Runell Gess, MD;  [facility] INVASIVE CV LAB;  Service: Cardiovascular;  Laterality: N/A;   PERIPHERAL VASCULAR INTERVENTION Right 09/19/2016   Procedure: Peripheral Vascular Intervention;  Surgeon: Nada Libman, MD;  [facility]: [facility];  Service: Cardiovascular;  Laterality: Right;  ext iliac stent   PERIPHERAL VASCULAR INTERVENTION Right 09/22/2021   Procedure: PERIPHERAL VASCULAR INTERVENTION;  Surgeon: Cephus Shelling, MD;  [facility];  Service: Cardiovascular;  Laterality: Right;   POLYPECTOMY  02/19/2018   Procedure: POLYPECTOMY;  Surgeon: Corbin Ade, MD;  [facility];   Service: Endoscopy;;   PROCTOSCOPY  10/24/2017   Procedure: PROCTOSCOPY;  Surgeon: Corbin Ade, MD;  [facility];  Service: Endoscopy;;   TEE WITHOUT CARDIOVERSION N/A 10/18/2015   Procedure: TRANSESOPHAGEAL ECHOCARDIOGRAM (TEE);  Surgeon: Loreli Slot, MD;  Location: [facility] OR;  Service: Open Heart Surgery;  Laterality: N/A;    Past Family History   Family History  Problem Relation Age of Onset   Heart attack Mother    Stroke Mother    Colon cancer Brother  late 53s    Past Social History   Social History   Socioeconomic History   Marital status: Married    Spouse name: Not on file   Number of children: Not on file   Years of education: Not on file   Highest education level: Not on file  Occupational History   Not on file  Tobacco Use   Smoking status: Former    Packs/day: 2.00    Years: 30.00    Additional pack years: 0.00    Total pack years: 60.00    Types: Cigarettes    Quit date: 08/14/2000    Years since quitting: 22.5   Smokeless tobacco: Never  Vaping Use   Vaping Use: Never used  Substance and Sexual Activity   Alcohol use: No   Drug use: No   Sexual activity: Not Currently  Other Topics Concern   Not on file  Social History Narrative   Not on file   Social Determinants of Health   Financial Resource Strain: Not on file  Food Insecurity: Not on file  Transportation Needs: Not on file  Physical Activity: Not on file  Stress: Not on file  Social Connections: Not on file  Intimate Partner Violence: Not on file    Review of Systems   General: Negative for anorexia, fever, chills,  weakness. +weight loss and fatigue ENT: Negative for hoarseness, difficulty swallowing , nasal congestion. CV: Negative for chest pain, angina, palpitations, dyspnea on exertion, peripheral edema.  Respiratory: Negative for dyspnea at rest, dyspnea on exertion, +cough, sputum, wheezing.  GI: See history of present illness. GU:  Negative for  dysuria, hematuria, urinary incontinence, urinary frequency, nocturnal urination.  Endo: Negative for unusual weight change.     Physical Exam   BP (!) 170/68 (BP Location: Right Arm, Patient Position: Sitting, Cuff Size: Normal)   Pulse 91   Temp 98.1 F (36.7 C) (Oral)   Ht 5\' 7"  (1.702 m)   Wt 150 lb 12.8 oz (68.4 kg)   SpO2 96%   BMI 23.62 kg/m    General: Well-nourished, well-developed in no acute distress.  Eyes: No icterus. Mouth: Oropharyngeal mucosa moist and pink   Lungs: Clear to auscultation bilaterally.  Heart: Regular rate and rhythm, no murmurs rubs or gallops.  Abdomen: Bowel sounds are normal, nontender, nondistended, no hepatosplenomegaly or masses,  no abdominal bruits, no rebound or guarding.  Small umbilical hernia easily reducible and nontender Rectal: Not performed Extremities: No lower extremity edema. No clubbing or deformities. Neuro: Alert and oriented x 4   Skin: Warm and dry, no jaundice.   Psych: Alert and cooperative, normal mood and affect.  Labs   Lab Results  Component Value Date   CREATININE 2.40 (H) 10/05/2022   BUN 28 (H) 10/05/2022   NA 139 10/05/2022   K 4.3 10/05/2022   CL 105 10/05/2022   CO2 25 04/06/2021                HGB 13.6 10/05/2022                    Imaging Studies    CT A/P with and without contrast 04/2022: 1. No left renal mass. Dromedary hump in the left mid kidney.  Bilateral renal scarring.  2. No bladder mass demonstrated. Appearance at ultrasound presumed  spurious.  3. Extensive and severe atherosclerosis, with high-grade stenosis of  both renal arteries, high-grade stenosis of the celiac trunk, and  high-grade stenosis and indeterminate  occlusion of the SMA. The  patient is at risk for chronic mesenteric ischemia. If he has  associated symptoms (postprandial abdominal pain, weight loss, etc)  then vascular referral may be indicated.  4. Suspected occlusion of the left common iliac and external  iliac  arteries, with a fem-fem bypass in place. Aortic Atherosclerosis  (ICD10-I70.0). Coronary atherosclerosis.  5. Mild prostatomegaly with some asymmetric enhancement in the right  peripheral zone of the prostate gland which could be from prior  inflammation or prostate cancer. Correlate with PSA levels.  6. Dependent density in the gallbladder favoring small gallstones  over sludge.   Assessment   *GERD *Diarrhea  *High-grade stenosis of both renal arteries, celiac trunk, and high-grade stenosis and indeterminate occulusion of SMA.  *abnormal prostate on CT 04/2022.  Frequent nocturnal occurrences of coughing/vomiting which are likely due to reflux. Coughing during the day less prominent. He does have a history of PNA in the past and is concerned given coughing. He denies any fever, shortness of breath. He also has new onset diarrhea, two weeks with episodes of incontinence at night. Has lost few pounds. No abdominal pain. Etiology unclear, no recent ill contacts, antibiotics, travel. Need to rule out infectious etiology. He has suspicious for chronic mesenteric ischemia noted on CT at outside facility last year as outlined. He has been followed by vein and vascular for suspected occlusion of left common iliac and external iliac arteries with prior fem-fem bypass with aortogram 09/2022 with no high grade stenosis of iliac arteries. No mention regarding over areas of concern.  Due for surveillance colonoscopy this year.   The patient was found to have elevated blood pressure when vital signs were checked in the office. The blood pressure was rechecked by the nursing staff and it was found be persistently elevated >140/90 mmHg. I personally advised to the patient to follow up closely with his PCP for hypertension control.  PLAN   Complete labs and stool test. Increase pantoprazole 40mg  BID. Try Reflux Gourmet, one teaspoon at bedtime. Request copy of u/s for review. See PCP for blood  pressures. Monitor for BPs at home, keep a log to report to PCP. Follow with PCP regarding cough, especially if worsens, increased in daytime, fever, etc. You may need a chest xray.  Consider colonoscopy and upper endoscopy in near future pending labs, review of records. Consider re-evaluation by vein and vascular regarding renal artery stenosis, celiac and SMA arteries.  After patient left office, noted CT findings regarding prostate, I will reach out and make sure he has seen urologist.    Leanna Battles. Melvyn Neth, MHS, PA-C [facility]

## 2023-02-20 ENCOUNTER — Ambulatory Visit (INDEPENDENT_AMBULATORY_CARE_PROVIDER_SITE_OTHER): Payer: Medicare HMO | Admitting: Gastroenterology

## 2023-02-20 ENCOUNTER — Encounter: Payer: Self-pay | Admitting: Gastroenterology

## 2023-02-20 VITALS — BP 170/68 | HR 91 | Temp 98.1°F | Ht 67.0 in | Wt 150.8 lb

## 2023-02-20 DIAGNOSIS — A09 Infectious gastroenteritis and colitis, unspecified: Secondary | ICD-10-CM | POA: Diagnosis not present

## 2023-02-20 DIAGNOSIS — K219 Gastro-esophageal reflux disease without esophagitis: Secondary | ICD-10-CM | POA: Diagnosis not present

## 2023-02-20 NOTE — Patient Instructions (Addendum)
Please complete labs today. I am also ordering stool test for diarrhea. Increase pantoprazole to 40mg  twice daily before breakfast and before bed.RX sent to pharmacy. Try Reflux Gourmet, one teaspoon at bedtime. You can buy online. I will request copy of recent ultrasound for review. Please see PCP regarding blood pressure. Your lungs sound ok today but discuss with PCP regarding possible chest xray. Monitor your blood pressures, keep log. Monitor for daytime coughing, fever, etc. If you notice these symptoms, let us know.  You will need a colonoscopy and upper endoscopy. We will schedule once labs/stool/records have been reviewed.

## 2023-02-21 LAB — CBC WITH DIFFERENTIAL/PLATELET
Basophils Absolute: 0.1 10*3/uL (ref 0.0–0.2)
Basos: 1 %
EOS (ABSOLUTE): 0.3 10*3/uL (ref 0.0–0.4)
Eos: 2 %
Hematocrit: 34.6 % — ABNORMAL LOW (ref 37.5–51.0)
Hemoglobin: 10.8 g/dL — ABNORMAL LOW (ref 13.0–17.7)
Immature Grans (Abs): 0.1 10*3/uL (ref 0.0–0.1)
Immature Granulocytes: 1 %
Lymphocytes Absolute: 1.3 10*3/uL (ref 0.7–3.1)
Lymphs: 10 %
MCH: 25.3 pg — ABNORMAL LOW (ref 26.6–33.0)
MCHC: 31.2 g/dL — ABNORMAL LOW (ref 31.5–35.7)
MCV: 81 fL (ref 79–97)
Monocytes Absolute: 1.2 10*3/uL — ABNORMAL HIGH (ref 0.1–0.9)
Monocytes: 9 %
Neutrophils Absolute: 9.7 10*3/uL — ABNORMAL HIGH (ref 1.4–7.0)
Neutrophils: 77 %
Platelets: 456 10*3/uL — ABNORMAL HIGH (ref 150–450)
RBC: 4.27 x10E6/uL (ref 4.14–5.80)
RDW: 15.4 % (ref 11.6–15.4)
WBC: 12.6 10*3/uL — ABNORMAL HIGH (ref 3.4–10.8)

## 2023-02-21 LAB — COMPREHENSIVE METABOLIC PANEL
ALT: 6 IU/L (ref 0–44)
AST: 9 IU/L (ref 0–40)
Albumin: 4.3 g/dL (ref 3.8–4.8)
Alkaline Phosphatase: 127 IU/L — ABNORMAL HIGH (ref 44–121)
BUN/Creatinine Ratio: 10 (ref 10–24)
BUN: 24 mg/dL (ref 8–27)
Bilirubin Total: 0.4 mg/dL (ref 0.0–1.2)
CO2: 20 mmol/L (ref 20–29)
Calcium: 9.3 mg/dL (ref 8.6–10.2)
Chloride: 106 mmol/L (ref 96–106)
Creatinine, Ser: 2.3 mg/dL — ABNORMAL HIGH (ref 0.76–1.27)
Globulin, Total: 2.5 g/dL (ref 1.5–4.5)
Glucose: 125 mg/dL — ABNORMAL HIGH (ref 70–99)
Potassium: 4.4 mmol/L (ref 3.5–5.2)
Sodium: 139 mmol/L (ref 134–144)
Total Protein: 6.8 g/dL (ref 6.0–8.5)
eGFR: 29 mL/min/{1.73_m2} — ABNORMAL LOW (ref >59)

## 2023-02-21 LAB — LIPASE: Lipase: 37 U/L (ref 13–78)

## 2023-02-24 LAB — GI PROFILE, STOOL, PCR

## 2023-02-27 NOTE — Telephone Encounter (Signed)
noted 

## 2023-03-07 ENCOUNTER — Emergency Department (HOSPITAL_COMMUNITY)
Admission: EM | Admit: 2023-03-07 | Discharge: 2023-03-07 | Disposition: A | Discharge status: Home / Self Care | Payer: No Typology Code available for payment source | Attending: Emergency Medicine | Admitting: Emergency Medicine

## 2023-03-07 ENCOUNTER — Other Ambulatory Visit: Payer: Self-pay

## 2023-03-07 ENCOUNTER — Emergency Department (HOSPITAL_COMMUNITY): Payer: No Typology Code available for payment source

## 2023-03-07 ENCOUNTER — Encounter (HOSPITAL_COMMUNITY): Payer: Self-pay | Admitting: Emergency Medicine

## 2023-03-07 DIAGNOSIS — R059 Cough, unspecified: Secondary | ICD-10-CM | POA: Diagnosis present

## 2023-03-07 DIAGNOSIS — N189 Chronic kidney disease, unspecified: Secondary | ICD-10-CM | POA: Diagnosis not present

## 2023-03-07 DIAGNOSIS — J189 Pneumonia, unspecified organism: Secondary | ICD-10-CM

## 2023-03-07 DIAGNOSIS — Z7902 Long term (current) use of antithrombotics/antiplatelets: Secondary | ICD-10-CM | POA: Insufficient documentation

## 2023-03-07 DIAGNOSIS — R058 Other specified cough: Secondary | ICD-10-CM | POA: Insufficient documentation

## 2023-03-07 DIAGNOSIS — Z951 Presence of aortocoronary bypass graft: Secondary | ICD-10-CM | POA: Diagnosis not present

## 2023-03-07 DIAGNOSIS — Z79899 Other long term (current) drug therapy: Secondary | ICD-10-CM | POA: Insufficient documentation

## 2023-03-07 DIAGNOSIS — Z7982 Long term (current) use of aspirin: Secondary | ICD-10-CM | POA: Diagnosis not present

## 2023-03-07 DIAGNOSIS — R053 Chronic cough: Secondary | ICD-10-CM

## 2023-03-07 DIAGNOSIS — E1122 Type 2 diabetes mellitus with diabetic chronic kidney disease: Secondary | ICD-10-CM | POA: Insufficient documentation

## 2023-03-07 DIAGNOSIS — I2510 Atherosclerotic heart disease of native coronary artery without angina pectoris: Secondary | ICD-10-CM | POA: Diagnosis not present

## 2023-03-07 LAB — CBC
HCT: 34.1 % — ABNORMAL LOW (ref 39.0–52.0)
Hemoglobin: 10.7 g/dL — ABNORMAL LOW (ref 13.0–17.0)
MCH: 25.3 pg — ABNORMAL LOW (ref 26.0–34.0)
MCHC: 31.4 g/dL (ref 30.0–36.0)
MCV: 80.6 fL (ref 80.0–100.0)
Platelets: 383 10*3/uL (ref 150–400)
RBC: 4.23 MIL/uL (ref 4.22–5.81)
RDW: 16.7 % — ABNORMAL HIGH (ref 11.5–15.5)
WBC: 9.9 10*3/uL (ref 4.0–10.5)
nRBC: 0 % (ref 0.0–0.2)

## 2023-03-07 LAB — BASIC METABOLIC PANEL
Anion gap: 12 (ref 5–15)
BUN: 31 mg/dL — ABNORMAL HIGH (ref 8–23)
CO2: 22 mmol/L (ref 22–32)
Calcium: 9.1 mg/dL (ref 8.9–10.3)
Chloride: 98 mmol/L (ref 98–111)
Creatinine, Ser: 2.47 mg/dL — ABNORMAL HIGH (ref 0.61–1.24)
GFR, Estimated: 27 mL/min — ABNORMAL LOW (ref >60)
Glucose, Bld: 138 mg/dL — ABNORMAL HIGH (ref 70–99)
Potassium: 3.9 mmol/L (ref 3.5–5.1)
Sodium: 132 mmol/L — ABNORMAL LOW (ref 135–145)

## 2023-03-07 MED ORDER — AZITHROMYCIN 250 MG PO TABS: 250.0000 mg | ORAL_TABLET | Freq: Every day | ORAL | 0 refills | Status: DC

## 2023-03-07 MED ORDER — AMOXICILLIN-POT CLAVULANATE 500-125 MG PO TABS: 1.0000 | ORAL_TABLET | Freq: Two times a day (BID) | ORAL | 0 refills | Status: AC

## 2023-03-07 MED ORDER — AMOXICILLIN-POT CLAVULANATE 500-125 MG PO TABS
1.0000 | ORAL_TABLET | Freq: Once | ORAL | Status: AC
  Administered 2023-03-07: 1 via ORAL
  Filled 2023-03-07: qty 1

## 2023-03-07 MED ORDER — AZITHROMYCIN 250 MG PO TABS
500.0000 mg | ORAL_TABLET | Freq: Once | ORAL | Status: AC
  Administered 2023-03-07: 500 mg via ORAL
  Filled 2023-03-07: qty 2

## 2023-03-07 NOTE — Discharge Instructions (Addendum)
Continue taking your pantoprazole as directed.  [facility].  Your CT scan does show pneumonia and we have started you on antibiotics.  Your CT scan also shows an abnormality on your T9 vertebral body which looks different than your last CT scan.  This could be inflammation, an unusual type of infection, or possibly a cancer, but it is very hard to tell from CT scan only.  The radiologist recommends getting an MRI through your primary doctor.   " There is a mixed density lesion in the right side of body of T9  vertebra with interval progression. Findings may suggest  inflammatory or neoplastic process. If clinically warranted,  follow-up MRI may be considered.  "  Please return to the emergency department if you have any worsening symptoms such as difficulty breathing, fainting, worsening cough, chest pain, or any other new symptoms.

## 2023-03-07 NOTE — ED Triage Notes (Signed)
Pt sent from [facility] clinic in [place]. Pt diagnosed with PNA a month ago and worried about aspiration. Pt endorses cough, worse at night. Sometimes he coughs so much that it causes him to throw up. Denies CP or SOB. Pt currently on abx that he started 2 weeks ago.

## 2023-03-07 NOTE — ED Provider Notes (Signed)
[place] EMERGENCY DEPARTMENT AT [facility] Provider Note   CSN: [id number] Arrival date & time: 03/07/23  1428     History  Chief Complaint  Patient presents with   Pneumonia    [name] is a 77 y.o. male.   Pneumonia Pertinent negatives include no chest pain, no abdominal pain, no headaches and no shortness of breath.       [name] is a 77 y.o. male with past medical history of coronary artery disease prior CABG, CKD, type 2 diabetes, peripheral vascular disease, recurrent pneumonia and GERD who presents to the Emergency Department under recommendation of the [facility] for evaluation of possible aspiration pneumonia.  He was seen in the clinic approximately 2 weeks ago for persistent cough which typically worsens at night.  He endorses intermittent episodes of posttussive emesis as well.  He was started on an unknown antibiotic from the [place].  Finished his last dose 3 days ago.  He was contacted by the clinic and recommended to come to the ER for further evaluation.  Patient denies any fever, chills, chest pain, difficulty swallowing, choking or shortness of breath.    Home Medications Prior to Admission medications   Medication Sig Start Date End Date Taking? Authorizing Provider  amLODipine (NORVASC) 10 MG tablet Take 10 mg by mouth in the morning.    [provider]  aspirin EC 81 MG tablet Take 1 tablet (81 mg total) by mouth daily with breakfast. 05/23/19   Mariea Clonts, Courage, MD  atorvastatin (LIPITOR) 40 MG tablet Take 1 tablet (40 mg total) by mouth daily at 6 PM. Patient taking differently: Take 40 mg by mouth at bedtime. 09/16/18   Catarina Hartshorn, MD  cilostazol (PLETAL) 100 MG tablet Take 100 mg by mouth 2 (two) times daily.    [provider]  clopidogrel (PLAVIX) 75 MG tablet Take 75 mg by mouth in the morning.    [provider]  empagliflozin (JARDIANCE) 25 MG TABS tablet Take 12.5 mg by mouth in the morning.     [provider]  hydrochlorothiazide (HYDRODIURIL) 12.5 MG tablet Take 12.5 mg by mouth in the morning.    [provider]  insulin glargine (LANTUS) 100 UNIT/ML injection Inject 20 Units into the skin at bedtime.    [provider]  losartan (COZAAR) 50 MG tablet Take 50 mg by mouth daily. 03/02/22   [provider]  metoprolol tartrate (LOPRESSOR) 50 MG tablet Take 25 mg by mouth 2 (two) times daily.     [provider]  pantoprazole (PROTONIX) 40 MG tablet Take 1 tablet (40 mg total) by mouth daily. 03/29/22   Rourk, Gerrit Friends, MD  pregabalin (LYRICA) 75 MG capsule Take 75 mg by mouth 2 (two) times daily.    [provider]  vitamin B-12 (CYANOCOBALAMIN) 1000 MCG tablet Take 1,000 mcg by mouth in the morning.    [provider]      Allergies    Patient has no known allergies.    Review of Systems   Review of Systems  Constitutional:  Negative for appetite change, chills and fever.  Respiratory:  Positive for cough. Negative for chest tightness, shortness of breath and wheezing.   Cardiovascular:  Negative for chest pain, palpitations and leg swelling.  Gastrointestinal:  Negative for abdominal pain, nausea and vomiting.  Genitourinary:  Negative for dysuria.  Musculoskeletal:  Negative for back pain.  Neurological:  Negative for dizziness, weakness, numbness and headaches.  Physical Exam Updated Vital Signs BP (!) 158/62   Pulse 84   Temp 97.8 F (36.6 C) (Oral)   Resp 20   Ht 5\' 7"  (1.702 m)   Wt 68 kg   SpO2 96%   BMI 23.48 kg/m  Physical Exam Vitals and nursing note reviewed.  Constitutional:      General: He is not in acute distress.    Appearance: Normal appearance. He is not ill-appearing.  Cardiovascular:     Rate and Rhythm: Normal rate and regular rhythm.     Pulses: Normal pulses.  Pulmonary:     Effort: Pulmonary effort is normal. No respiratory distress.     Breath sounds: No wheezing.   Abdominal:     Palpations: Abdomen is soft.     Tenderness: There is no abdominal tenderness.  Musculoskeletal:     Right lower leg: No edema.     Left lower leg: No edema.  Skin:    General: Skin is warm.     Capillary Refill: Capillary refill takes less than 2 seconds.  Neurological:     General: No focal deficit present.     Mental Status: He is alert.     Sensory: No sensory deficit.     Motor: No weakness.     ED Results / Procedures / Treatments   Labs (all labs ordered are listed, but only abnormal results are displayed) Labs Reviewed  CBC - Abnormal; Notable for the following components:      Result Value   Hemoglobin 10.7 (*)    HCT 34.1 (*)    MCH 25.3 (*)    RDW 16.7 (*)    All other components within normal limits  BASIC METABOLIC PANEL - Abnormal; Notable for the following components:   Sodium 132 (*)    Glucose, Bld 138 (*)    BUN 31 (*)    Creatinine, Ser 2.47 (*)    GFR, Estimated 27 (*)    All other components within normal limits    EKG None  Radiology DG Chest 2 View  Result Date: 03/07/2023 CLINICAL DATA:  Cough. EXAM: CHEST - 2 VIEW COMPARISON:  August 10, 2022.  August 15, 2022. FINDINGS: The heart size and mediastinal contours are within normal limits. Status post coronary artery bypass graft. Right lung is clear. Stable left perihilar and lingular densities are noted most consistent with post infectious scarring as noted on prior studies. No definite acute abnormality is noted. Bony thorax is unremarkable. The visualized skeletal structures are unremarkable. 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 No definite acute abnormality is noted. Electronically Signed   By: Lupita Raider M.D.   On: 03/07/2023 15:59    Procedures Procedures    Medications Ordered in ED Medications - No data to display  ED Course/ Medical Decision Making/ A&P                             Medical Decision  Making Patient is seen here under recommendation of [facility] for possible aspiration pneumonia.  Was seen in their clinic approximately 2 weeks ago started on antibiotics (patient cannot recall name of antibiotic) recently completed antibiotic.  He endorses persistent GERD nocturnal cough and intermittent posttussive emesis.  No fever or chills.  No recent choking episodes or difficulty with swallowing.  Overall, exam reassuring.  Vital signs also reassuring.  No hypoxia tachycardia or tachypnea.  Differential would  include but not limited to worsening of his GERD, pneumonia, scarring, viral process.  PE also considered but felt less likely  On review of patient's medical record, he had Noncon CT of his chest in January of this year that showed chronic lung disease with likely inflammatory or postinfectious etiology  Amount and/or Complexity of Data Reviewed Labs: ordered.    Details: Labs interpreted by me, no evidence of leukocytosis, hemoglobin near baseline.  Chemistries show slight increase of BUN and creatinine but near baseline  Radiology: ordered.    Details: Chest x-ray today shows stable chronic lung findings consistent with postinfectious scarring Discussion of management or test interpretation with external provider(s): Patient also seen by Dr. Suezanne Jacquet and care plan discussed.  Will obtain non con CT chest for further evaluation. Dr. Suezanne Jacquet to review results, If no acute findings pt will f/u with the [facility]           Final Clinical Impression(s) / ED Diagnoses Final diagnoses:  Persistent cough    Rx / DC Orders ED Discharge Orders     None         Rosey Bath 03/07/23 1924    Lonell Grandchild, MD 03/07/23 2035

## 2023-03-12 ENCOUNTER — Telehealth: Payer: Self-pay | Admitting: Vascular Surgery

## 2023-03-12 NOTE — Telephone Encounter (Signed)
-----   Message from Graceann Congress sent at 03/12/2023  7:02 AM EDT ----- Regarding: FW: Can you please arrange for this patient to have follow up with Dr. Chestine Spore in near future. He needs renal and mesenteric duplex at the time of his apt. Thank you ----- Message ----- From: De Blanch Sent: 03/11/2023   6:27 PM EDT To: Graceann Congress, PA-C; Tiffany Kocher, PA-C Subject: RE:                                            Thank you! ----- Message ----- From: Graceann Congress, PA-C Sent: 03/11/2023  10:54 AM EDT To: Tiffany Kocher, PA-C Subject: RE:                                            Lawana Chambers, Yes  we would be happy to see him and evaluate for any need of any intervention. I will send message to my office to schedule him with some ultrasounds and to see one of the surgeons. thanks   Corrie B ----- Message ----- From: De Blanch Sent: 03/11/2023  10:17 AM EDT To: Graceann Congress, PA-C; Tiffany Kocher, PA-C  Hi! I have been seeing patient for GI issues. Diarrhea, weight loss but no abdominal pain. Also he has worsening renal functions. I noticed a CT at [facility] in [facility] done in 04/2022 with following findings. 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 The  patient is at risk for chronic mesenteric ischemia. If he has  associated symptoms (postprandial abdominal pain, weight loss, etc)  then vascular referral may be indicated. "  Verlon Au

## 2023-03-21 ENCOUNTER — Telehealth: Payer: Self-pay | Admitting: Gastroenterology

## 2023-03-21 NOTE — Telephone Encounter (Signed)
Pt has been contacted and is scheduled with Dr. Chestine Spore on 05/01/2023 @ 1:40pm.

## 2023-03-21 NOTE — Telephone Encounter (Signed)
Appt has been scheduled.

## 2023-03-21 NOTE — Telephone Encounter (Signed)
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 They plan on doing u/s in the office and then seen Dr. Chestine Spore. Please have patient return call to Dr. Ophelia Charter office to make arrangements.

## 2023-03-21 NOTE — Telephone Encounter (Signed)
Thanks

## 2023-03-22 ENCOUNTER — Other Ambulatory Visit: Payer: Self-pay | Admitting: *Deleted

## 2023-03-22 DIAGNOSIS — R197 Diarrhea, unspecified: Secondary | ICD-10-CM

## 2023-03-22 DIAGNOSIS — R634 Abnormal weight loss: Secondary | ICD-10-CM

## 2023-03-22 DIAGNOSIS — N289 Disorder of kidney and ureter, unspecified: Secondary | ICD-10-CM

## 2023-03-28 ENCOUNTER — Ambulatory Visit (INDEPENDENT_AMBULATORY_CARE_PROVIDER_SITE_OTHER)
Admission: RE | Admit: 2023-03-28 | Discharge: 2023-03-28 | Disposition: A | Discharge status: Home / Self Care | Payer: No Typology Code available for payment source | Source: Ambulatory Visit | Attending: Vascular Surgery

## 2023-03-28 ENCOUNTER — Ambulatory Visit (HOSPITAL_COMMUNITY)
Admission: RE | Admit: 2023-03-28 | Discharge: 2023-03-28 | Disposition: A | Discharge status: Home / Self Care | Payer: No Typology Code available for payment source | Source: Ambulatory Visit | Attending: Vascular Surgery | Admitting: Vascular Surgery

## 2023-03-28 DIAGNOSIS — N289 Disorder of kidney and ureter, unspecified: Secondary | ICD-10-CM | POA: Insufficient documentation

## 2023-03-28 DIAGNOSIS — R197 Diarrhea, unspecified: Secondary | ICD-10-CM | POA: Insufficient documentation

## 2023-03-28 DIAGNOSIS — I10 Essential (primary) hypertension: Secondary | ICD-10-CM | POA: Insufficient documentation

## 2023-03-28 DIAGNOSIS — E119 Type 2 diabetes mellitus without complications: Secondary | ICD-10-CM | POA: Insufficient documentation

## 2023-03-28 DIAGNOSIS — R634 Abnormal weight loss: Secondary | ICD-10-CM | POA: Diagnosis present

## 2023-03-28 DIAGNOSIS — I2510 Atherosclerotic heart disease of native coronary artery without angina pectoris: Secondary | ICD-10-CM | POA: Insufficient documentation

## 2023-03-28 DIAGNOSIS — I774 Celiac artery compression syndrome: Secondary | ICD-10-CM | POA: Insufficient documentation

## 2023-03-28 DIAGNOSIS — K551 Chronic vascular disorders of intestine: Secondary | ICD-10-CM | POA: Insufficient documentation

## 2023-03-28 DIAGNOSIS — E785 Hyperlipidemia, unspecified: Secondary | ICD-10-CM | POA: Diagnosis not present

## 2023-03-28 DIAGNOSIS — F17200 Nicotine dependence, unspecified, uncomplicated: Secondary | ICD-10-CM | POA: Insufficient documentation

## 2023-04-13 ENCOUNTER — Emergency Department (HOSPITAL_COMMUNITY): Payer: No Typology Code available for payment source

## 2023-04-13 ENCOUNTER — Other Ambulatory Visit: Payer: Self-pay

## 2023-04-13 ENCOUNTER — Emergency Department (HOSPITAL_COMMUNITY)
Admission: EM | Admit: 2023-04-13 | Discharge: 2023-04-13 | Disposition: A | Discharge status: Home / Self Care | Payer: No Typology Code available for payment source

## 2023-04-13 DIAGNOSIS — D72829 Elevated white blood cell count, unspecified: Secondary | ICD-10-CM | POA: Insufficient documentation

## 2023-04-13 DIAGNOSIS — N183 Chronic kidney disease, stage 3 unspecified: Secondary | ICD-10-CM | POA: Diagnosis not present

## 2023-04-13 DIAGNOSIS — Z7902 Long term (current) use of antithrombotics/antiplatelets: Secondary | ICD-10-CM | POA: Insufficient documentation

## 2023-04-13 DIAGNOSIS — Z951 Presence of aortocoronary bypass graft: Secondary | ICD-10-CM | POA: Diagnosis not present

## 2023-04-13 DIAGNOSIS — E1122 Type 2 diabetes mellitus with diabetic chronic kidney disease: Secondary | ICD-10-CM | POA: Diagnosis not present

## 2023-04-13 DIAGNOSIS — I129 Hypertensive chronic kidney disease with stage 1 through stage 4 chronic kidney disease, or unspecified chronic kidney disease: Secondary | ICD-10-CM | POA: Diagnosis not present

## 2023-04-13 DIAGNOSIS — R197 Diarrhea, unspecified: Secondary | ICD-10-CM | POA: Diagnosis present

## 2023-04-13 DIAGNOSIS — Z794 Long term (current) use of insulin: Secondary | ICD-10-CM | POA: Insufficient documentation

## 2023-04-13 DIAGNOSIS — Z7982 Long term (current) use of aspirin: Secondary | ICD-10-CM | POA: Insufficient documentation

## 2023-04-13 DIAGNOSIS — Z79899 Other long term (current) drug therapy: Secondary | ICD-10-CM | POA: Insufficient documentation

## 2023-04-13 LAB — COMPREHENSIVE METABOLIC PANEL
ALT: 13 U/L (ref 0–44)
AST: 21 U/L (ref 15–41)
Albumin: 3.5 g/dL (ref 3.5–5.0)
Alkaline Phosphatase: 75 U/L (ref 38–126)
Anion gap: 14 (ref 5–15)
BUN: 28 mg/dL — ABNORMAL HIGH (ref 8–23)
CO2: 17 mmol/L — ABNORMAL LOW (ref 22–32)
Calcium: 7.5 mg/dL — ABNORMAL LOW (ref 8.9–10.3)
Chloride: 101 mmol/L (ref 98–111)
Creatinine, Ser: 2.74 mg/dL — ABNORMAL HIGH (ref 0.61–1.24)
GFR, Estimated: 23 mL/min — ABNORMAL LOW (ref >60)
Glucose, Bld: 170 mg/dL — ABNORMAL HIGH (ref 70–99)
Potassium: 3.3 mmol/L — ABNORMAL LOW (ref 3.5–5.1)
Sodium: 132 mmol/L — ABNORMAL LOW (ref 135–145)
Total Bilirubin: 0.8 mg/dL (ref 0.3–1.2)
Total Protein: 6.6 g/dL (ref 6.5–8.1)

## 2023-04-13 LAB — C DIFFICILE QUICK SCREEN W PCR REFLEX
C Diff antigen: NEGATIVE
C Diff interpretation: NOT DETECTED
C Diff toxin: NEGATIVE

## 2023-04-13 LAB — CBC
HCT: 31.5 % — ABNORMAL LOW (ref 39.0–52.0)
Hemoglobin: 9.7 g/dL — ABNORMAL LOW (ref 13.0–17.0)
MCH: 24.1 pg — ABNORMAL LOW (ref 26.0–34.0)
MCHC: 30.8 g/dL (ref 30.0–36.0)
MCV: 78.2 fL — ABNORMAL LOW (ref 80.0–100.0)
Platelets: 405 10*3/uL — ABNORMAL HIGH (ref 150–400)
RBC: 4.03 MIL/uL — ABNORMAL LOW (ref 4.22–5.81)
RDW: 16.8 % — ABNORMAL HIGH (ref 11.5–15.5)
WBC: 17.7 10*3/uL — ABNORMAL HIGH (ref 4.0–10.5)
nRBC: 0 % (ref 0.0–0.2)

## 2023-04-13 LAB — LIPASE, BLOOD: Lipase: 32 U/L (ref 11–51)

## 2023-04-13 MED ORDER — LACTATED RINGERS IV BOLUS
1000.0000 mL | Freq: Once | INTRAVENOUS | Status: AC
  Administered 2023-04-13: 1000 mL via INTRAVENOUS

## 2023-04-13 NOTE — Discharge Instructions (Signed)
It was a pleasure taking part in your care today.  As we discussed, your workup is reassuring.  I would like for you to follow-up with your gastroenterology team this week for further management of your diarrhea.  Please also follow-up with your kidney doctor for ongoing management of your increased creatinine.  Please return to the ED with any new or worsening signs or symptoms such as fevers.  Please continue taking all prescribed medications as directed.

## 2023-04-13 NOTE — ED Provider Notes (Signed)
[place] EMERGENCY DEPARTMENT AT [facility] Provider Note   CSN: [id number] Arrival date & time: 04/13/23  1142     History  Chief Complaint  Patient presents with   Diarrhea    [name] is a 77 y.o. male with medical history of diverticulitis, CABG on blood thinner, CKD stage III, diabetes, GERD, hypertension.  Patient presents to ED for evaluation of persistent diarrhea for 2 months, weight loss.  Patient states that he has been experiencing diarrhea that occurs every day for the last 2 months.  He states that this has occurred in the past, many years ago, he was seen at the [place] and given a "shot" along with pills and this subsided his diarrhea.  The patient reports that at that time he had had diarrhea for maybe 5 or 6 days.  He states he has had no issues with diarrhea since this time, many years ago.  He reports that over the last 2 months he has had persistent diarrhea that is occurred every day.  He states more recently he has been having issues with bowel incontinence and will wake up throughout the night noting that he has stooled in his bed.  He denies any low back pain, lower extremity weakness, groin numbness associated.  Denies any incontinence of urine.  He also states that over the last 2 months every night around 1130 he will have postprandial nausea and vomiting.  He was seen at outside ED back in July and diagnosed with pneumonia.  At this time, patient had CT scan of his chest without contrast and was started on Augmentin and azithromycin.  He states that his diarrhea had no change in the severity or frequency after starting antibiotics, states that his diarrhea was present before the antibiotics began.  He is also endorsing a 14 pound weight loss over the last 2 months which he states is unintentional.  He goes on to state that he is trying to eat less because he is concerned that he will have nausea and vomiting as a result of ingestion.  Denies fevers, abdominal  pain, chest pain or shortness of breath   Diarrhea Associated symptoms: vomiting   Associated symptoms: no abdominal pain and no fever        Home Medications Prior to Admission medications   Medication Sig Start Date End Date Taking? Authorizing Provider  amLODipine (NORVASC) 10 MG tablet Take 10 mg by mouth in the morning.    [provider]  aspirin EC 81 MG tablet Take 1 tablet (81 mg total) by mouth daily with breakfast. 05/23/19   Mariea Clonts, Courage, MD  atorvastatin (LIPITOR) 40 MG tablet Take 1 tablet (40 mg total) by mouth daily at 6 PM. Patient taking differently: Take 40 mg by mouth at bedtime. 09/16/18   Catarina Hartshorn, MD  azithromycin (ZITHROMAX) 250 MG tablet Take 1 tablet (250 mg total) by mouth daily. Take first 2 tablets together, then 1 every day until finished. 03/07/23   Lonell Grandchild, MD  cilostazol (PLETAL) 100 MG tablet Take 100 mg by mouth 2 (two) times daily.    [provider]  clopidogrel (PLAVIX) 75 MG tablet Take 75 mg by mouth in the morning.    [provider]  empagliflozin (JARDIANCE) 25 MG TABS tablet Take 12.5 mg by mouth in the morning.    [provider]  hydrochlorothiazide (HYDRODIURIL) 12.5 MG tablet Take 12.5 mg by mouth in the morning.    [provider]  insulin glargine (LANTUS) 100 UNIT/ML injection Inject 20 Units into the skin at bedtime.    [provider]  losartan (COZAAR) 50 MG tablet Take 50 mg by mouth daily. 03/02/22   [provider]  metoprolol tartrate (LOPRESSOR) 50 MG tablet Take 25 mg by mouth 2 (two) times daily.     [provider]  pantoprazole (PROTONIX) 40 MG tablet Take 1 tablet (40 mg total) by mouth daily. 03/29/22   Rourk, Gerrit Friends, MD  pregabalin (LYRICA) 75 MG capsule Take 75 mg by mouth 2 (two) times daily.    [provider]  vitamin B-12 (CYANOCOBALAMIN) 1000 MCG tablet Take 1,000 mcg by mouth in the morning.    [provider]       Allergies    Patient has no known allergies.    Review of Systems   Review of Systems  Constitutional:  Negative for fever.  Respiratory:  Negative for shortness of breath.   Cardiovascular:  Negative for chest pain.  Gastrointestinal:  Positive for diarrhea, nausea and vomiting. Negative for abdominal pain.    Physical Exam Updated Vital Signs BP 137/72   Pulse 89   Temp 98.3 F (36.8 C)   Resp 10   SpO2 95%  Physical Exam Vitals and nursing note reviewed.  Constitutional:      General: He is not in acute distress.    Appearance: Normal appearance. He is not ill-appearing, toxic-appearing or diaphoretic.  HENT:     Head: Normocephalic and atraumatic.     Mouth/Throat:     Mouth: Mucous membranes are moist.     Pharynx: Oropharynx is clear.  Eyes:     Extraocular Movements: Extraocular movements intact.     Conjunctiva/sclera: Conjunctivae normal.     Pupils: Pupils are equal, round, and reactive to light.  Cardiovascular:     Rate and Rhythm: Normal rate and regular rhythm.  Pulmonary:     Effort: Pulmonary effort is normal.     Breath sounds: Normal breath sounds. No wheezing.  Abdominal:     General: Abdomen is flat. Bowel sounds are normal.     Palpations: Abdomen is soft.     Tenderness: There is no abdominal tenderness.  Musculoskeletal:     Cervical back: Normal range of motion and neck supple. No tenderness.  Skin:    General: Skin is warm and dry.     Capillary Refill: Capillary refill takes less than 2 seconds.  Neurological:     General: No focal deficit present.     Mental Status: He is alert and oriented to person, place, and time.     GCS: GCS eye subscore is 4. GCS verbal subscore is 5. GCS motor subscore is 6.     Cranial Nerves: Cranial nerves 2-12 are intact. No cranial nerve deficit.     Sensory: Sensation is intact. No sensory deficit.     Motor: Motor function is intact. No weakness.     Coordination: Coordination is intact. Heel to [facility]  Test normal.     Comments: 5 out of 5 strength bilateral lower extremities     ED Results / Procedures / Treatments   Labs (all labs ordered are listed, but only abnormal results are displayed) Labs Reviewed  COMPREHENSIVE METABOLIC PANEL - Abnormal; Notable for the following components:      Result Value   Sodium 132 (*)    Potassium 3.3 (*)    CO2 17 (*)    Glucose,  Bld 170 (*)    BUN 28 (*)    Creatinine, Ser 2.74 (*)    Calcium 7.5 (*)    GFR, Estimated 23 (*)    All other components within normal limits  CBC - Abnormal; Notable for the following components:   WBC 17.7 (*)    RBC 4.03 (*)    Hemoglobin 9.7 (*)    HCT 31.5 (*)    MCV 78.2 (*)    MCH 24.1 (*)    RDW 16.8 (*)    Platelets 405 (*)    All other components within normal limits  C DIFFICILE QUICK SCREEN W PCR REFLEX    GASTROINTESTINAL PANEL BY PCR, STOOL (REPLACES STOOL CULTURE)  LIPASE, BLOOD  URINALYSIS, ROUTINE W REFLEX MICROSCOPIC    EKG None  Radiology CT ABDOMEN PELVIS WO CONTRAST  Result Date: 04/13/2023 CLINICAL DATA:  Unintended weight loss, change in bowel habits EXAM: CT ABDOMEN AND PELVIS WITHOUT CONTRAST TECHNIQUE: Multidetector CT imaging of the abdomen and pelvis was performed following the standard protocol without IV contrast. Unenhanced CT was performed per clinician order. Lack of IV contrast limits sensitivity and specificity, especially for evaluation of abdominal/pelvic solid viscera. RADIATION DOSE REDUCTION: This exam was performed according to the departmental dose-optimization program which includes automated exposure control, adjustment of the mA and/or kV according to patient size and/or use of iterative reconstruction technique. COMPARISON:  12/12/2017 FINDINGS: Lower chest: There is patchy ground-glass airspace disease noted within the right middle and right lower lobe, incompletely evaluated due to slice selection. Chronic scarring at the left lung base. Hepatobiliary: Small  calcified gallstones without evidence of acute cholecystitis. Unremarkable unenhanced appearance of the liver. Pancreas: Unremarkable unenhanced appearance. Spleen: Unremarkable unenhanced appearance. Adrenals/Urinary Tract: Stable right renal cyst does not require specific imaging follow-up. Mild bilateral renal cortical thinning. Extensive vascular calcifications at the renal hila. No urinary tract calculi or obstructive uropathy within either kidney. The adrenals and bladder are unremarkable. Stomach/Bowel: Postsurgical changes from prior sigmoid colon resection and reanastomosis. No bowel obstruction or ileus. Normal appendix right lower quadrant. No bowel wall thickening or inflammatory change. Vascular/Lymphatic: Extensive atherosclerosis of the aorta and its branches again noted. Vascular stents are seen within the right external and common iliac arteries. Postsurgical changes from prior femoral-femoral bypass. Evaluation of the vascular structures is limited without IV contrast. No pathologic adenopathy. Reproductive: Prostate is unremarkable. Other: No free fluid or free intraperitoneal gas. No abdominal wall hernia. Musculoskeletal: Prior ORIF left hip. No acute or destructive bony abnormalities. Reconstructed images demonstrate no additional findings. IMPRESSION: 1. Patchy ground-glass airspace disease within the right middle and right lower lobes, incompletely imaged due to slice selection. This may be inflammatory or infectious. 2. No bowel obstruction or ileus. 3. Cholelithiasis without cholecystitis. 4.  Aortic Atherosclerosis (ICD10-I70.0). Electronically Signed   By: Sharlet Salina M.D.   On: 04/13/2023 17:06    Procedures Procedures   Medications Ordered in ED Medications  lactated ringers bolus 1,000 mL (1,000 mLs Intravenous New Bag/Given 04/13/23 1907)    ED Course/ Medical Decision Making/ A&P  Medical Decision Making Amount and/or Complexity of Data Reviewed Labs:  ordered. Radiology: ordered.   77 year old male presents to ED for evaluation.  Please see HPI for further details.  On examination the patient is afebrile and nontachycardic.  His lung sounds are clear bilaterally, he is not hypoxic.  Abdomen is soft and compressible throughout.  Neurological examination at baseline, 5 out of 5 strength bilateral lower extremities.  Overall  nontoxic in appearance.  Will collect stool panels, basic labs, scan patient abdomen and pelvis.  CBC shows a white blood cell count elevation at 17.7, baseline hemoglobin.  Patient white blood cell count is elevated from previous visit.  His metabolic panel shows sodium 132, potassium 3.3, creatinine 2.74.  Patient creatinine has been steadily uptrending for the last 6 months.  Per patient, he is [facility]  Patient C. difficile panel negative.  PCR stool panel pending at this time.  Lipase WNL.  Urinalysis pending this time.  CT abdomen pelvis unremarkable.  On further chart review, it appears the patient has had issues with diarrhea for quite some time.  He is currently being seen by gastroenterology.  Will have the patient follow-up with gastroenterology.  No obvious source or cause of his diarrhea has been noted here.  Was noted that he had perhaps groundglass opacities on CT abdomen pelvis however patient denies any increased cough, fevers or shortness of breath.  Will provide patient 1 L of LR due to the fact that he is not eating or drinking as much at home.  Will have the patient follow-up with gastroenterology.  Return precautions provided and the patient voiced understanding.  He all of his questions answered to his satisfaction.  He is stable to discharge at this time.   Final Clinical Impression(s) / ED Diagnoses Final diagnoses:  Diarrhea, unspecified type    Rx / DC Orders ED Discharge Orders     None         Clent Ridges 04/13/23  1925    Durwin Glaze, MD 04/14/23 [phone]

## 2023-04-13 NOTE — ED Triage Notes (Signed)
Patient reports diarrhea with bowel incontinence x 2 months. Also states every night around 2330, he becomes nauseas and vomits once. Has had multiple evaluations for same without any remarkable results but continues to lose weight and have generalized weakness.

## 2023-04-13 NOTE — ED Notes (Signed)
Patient returned from CT

## 2023-04-14 LAB — GASTROINTESTINAL PANEL BY PCR, STOOL (REPLACES STOOL CULTURE)

## 2023-04-20 ENCOUNTER — Other Ambulatory Visit (HOSPITAL_COMMUNITY): Payer: Self-pay | Admitting: *Deleted

## 2023-04-20 ENCOUNTER — Encounter: Payer: Self-pay | Admitting: Gastroenterology

## 2023-04-20 ENCOUNTER — Telehealth: Payer: Self-pay | Admitting: Gastroenterology

## 2023-04-20 ENCOUNTER — Ambulatory Visit (INDEPENDENT_AMBULATORY_CARE_PROVIDER_SITE_OTHER): Payer: No Typology Code available for payment source | Admitting: Gastroenterology

## 2023-04-20 VITALS — BP 168/66 | HR 73 | Temp 97.5°F | Ht 67.0 in | Wt 143.2 lb

## 2023-04-20 DIAGNOSIS — K219 Gastro-esophageal reflux disease without esophagitis: Secondary | ICD-10-CM | POA: Diagnosis not present

## 2023-04-20 DIAGNOSIS — R634 Abnormal weight loss: Secondary | ICD-10-CM

## 2023-04-20 DIAGNOSIS — R197 Diarrhea, unspecified: Secondary | ICD-10-CM

## 2023-04-20 NOTE — Progress Notes (Addendum)
GI Office Note    Referring Provider: [facility] Primary Care Physician:  [facility]  Primary Gastroenterologist: Roetta Sessions, MD   Chief Complaint   Chief Complaint  Patient presents with   Colonoscopy    History of Present Illness   [name] is a 77 y.o. male presenting today for follow up. Last seen in 02/2023.   GI history significant for complicated diverticulitis requiring diverting colostomy (May 2019).  In 02/2018, he had a colonoscopy through his colostomy and proctoscopy showing small adenoma which was removed, slated for surveillance colonoscopy in 2024.  Subsequently had colostomy reversal (August 2019).  He has longstanding GERD.  EGD in 2019 with small hiatal hernia.   Historically his reflux has been difficult to manage over the past several months. He is sleeping in a reclined bed. He eats early to allow 3 hours between eating and laying. Down. He is reclined at greater than 45 degree angle when in bed. He avoids eating at time due prevent heartburn. Avoiding food triggers. Has lost 12 pounds. He denies dysphagia to solids and pills except for at night time. When he takes his night time dose of medications, he feels like he has pressure/pain in the center of his chest as if pills are not going down well.  In the ED April 13, 2023 with diarrhea.  For 31-month history of diarrhea with associated weight loss.  Recent stool study with negative C. difficile and GI pathogen panel.  Blood cell count 17,700, hemoglobin 9.7, platelets 405,000, sodium 132, potassium 3.3, BUN 28, creatinine 2.74.  CT abdomen pelvis without contrast with patchy groundglass airspace disease within the right middle lobe and right lower lobes of the lung, cholelithiasis.  He continues to have loose stools. No solid stools. BMs 2-3 per day. Some nocturnal stools. No melena, brbpr. Seen in the ED 8/30 with ongoing diarrhea and dehydration. Received IV fluids. Labs as  outlined below.   Scheduled for follow up with Dr. 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  [facility].   Since we last saw him, his metformin was stopped. He is going for iron infusions, first dose next week.   CT A/P with and without contrast 04/2022: performed due to possible renal mass seen on u/s -no left renal mass. 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 Bilateral renal scarring. -no bladder mass -extensive and severe atherosclerosis, with high-grade stenosis of both renal arteries, high-grade stenosis of the celiac trunk, and high-grade stenosis and indeterminate occulusion of the SMA. The patient is at risk for chronic mesenteric ischemia.  -suspected occlusion of the left common iliac and external iliac arteries, with a fem-fem bypass in place.  -mild prostatomegaly with some asymmetric enhancement in the right peripheral zone of the prostate gland which could be from prior inflammation of prostate cancer.  -Dependent density in the gallbladder favoring small gallstones over slude.  saw chronic mesenteric ichemia  CT A/P without contrast 04/13/23: IMPRESSION: 1. Patchy ground-glass airspace disease within the right middle and right lower lobes, incompletely imaged due to slice selection. This may be inflammatory or infectious. 2. No bowel obstruction or ileus. 3. Cholelithiasis without cholecystitis. 4.  Aortic Atherosclerosis (ICD10-I70.0).  CT Chest without contrast 03/07/23: IMPRESSION: There is dense infiltrate with air bronchogram in the left upper lobe with interval worsening suggesting worsening atelectasis/pneumonia. There are small patchy infiltrates in  the lingula and left lower lobe suggesting possible atelectasis/pneumonia.   Coronary artery calcifications are seen. Aortic arteriosclerosis. Small hiatal hernia. Possible left renal stones.   There is a mixed density  lesion in the right side of body of T9 vertebra with interval progression. Findings may suggest inflammatory or neoplastic process. If clinically warranted, follow-up MRI may be considered.  Colonoscopy 02/2018: -few scattered diverticula in entire residual colon, normal appearing residual rectal stump -one 5mm polyp hepatic flexure removed -next colonoscopy five years   EGD 10/2017: -normal esophagus -small hh  Wt Readings from Last 8 Encounters:  04/20/23 143 lb 3.2 oz (65 kg)  03/07/23 149 lb 14.6 oz (68 kg)  02/20/23 150 lb 12.8 oz (68.4 kg)  01/09/23 151 lb (68.5 kg)  11/22/22 152 lb 9.6 oz (69.2 kg)  10/05/22 153 lb (69.4 kg)  08/29/22 152 lb (68.9 kg)  08/15/22 155 lb (70.3 kg)     Medications   Current Outpatient Medications  Medication Sig Dispense Refill   amLODipine (NORVASC) 10 MG tablet Take 10 mg by mouth in the morning.     aspirin EC 81 MG tablet Take 1 tablet (81 mg total) by mouth daily with breakfast. 30 tablet 2   atorvastatin (LIPITOR) 40 MG tablet Take 1 tablet (40 mg total) by mouth daily at 6 PM. (Patient taking differently: Take 40 mg by mouth at bedtime.) 30 tablet 1   cilostazol (PLETAL) 100 MG tablet Take 100 mg by mouth 2 (two) times daily.     clopidogrel (PLAVIX) 75 MG tablet Take 75 mg by mouth in the morning.     empagliflozin (JARDIANCE) 25 MG TABS tablet Take 12.5 mg by mouth in the morning.     hydrochlorothiazide (HYDRODIURIL) 12.5 MG tablet Take 12.5 mg by mouth in the morning.     insulin glargine (LANTUS) 100 UNIT/ML injection Inject 20 Units into the skin at bedtime.     losartan (COZAAR) 50 MG tablet Take 50 mg by mouth daily.     metoprolol tartrate (LOPRESSOR) 50 MG tablet Take 25 mg by mouth 2 (two) times daily.      pantoprazole (PROTONIX) 40 MG tablet Take 1 tablet (40 mg total) by mouth daily. 90 tablet 3   pregabalin (LYRICA) 75 MG capsule Take 75 mg by mouth 2 (two) times daily.     vitamin B-12 (CYANOCOBALAMIN) 1000 MCG tablet  Take 1,000 mcg by mouth in the morning.     No current facility-administered medications for this visit.    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   Hypercholesteremia    Hypertension    Neuropathy    Peripheral vascular disease (HCC)    s/p R-L FEM-FEM BYPASS   Pneumonia    Tachycardia    after CABG, pt on medicine for this    Past Surgical History   Past Surgical History:  Procedure Laterality Date   ABDOMINAL AORTOGRAM Bilateral 09/19/2016   Procedure: iliac;  Surgeon: Nada Libman, MD;  Location: [facility] INVASIVE CV LAB;  Service: Cardiovascular;  Laterality: Bilateral;   ABDOMINAL AORTOGRAM W/LOWER EXTREMITY N/A 09/22/2021   Procedure: ABDOMINAL AORTOGRAM W/LOWER EXTREMITY;  Surgeon: Cephus Shelling, MD;  Location: [facility] INVASIVE CV LAB;  Service: Cardiovascular;  Laterality: N/A;   ABDOMINAL AORTOGRAM W/LOWER EXTREMITY N/A 10/05/2022   Procedure: ABDOMINAL AORTOGRAM W/LOWER EXTREMITY;  Surgeon: Cephus Shelling, MD;  Location: [facility] INVASIVE CV LAB;  Service: Cardiovascular;  Laterality: N/A;   CARDIAC CATHETERIZATION  2003   with stent   CARDIAC CATHETERIZATION N/A 10/11/2015   Procedure: Left Heart Cath and Coronary Angiography;  Surgeon: Peter M Swaziland, MD;  Location: [facility] INVASIVE CV LAB;  Service: Cardiovascular;  Laterality: N/A;   COLON RESECTION N/A 12/13/2017   Procedure: EXPLORATORY LAPAROTOMY, SIGMOID COLECTOMY WITH COLOSTOMY;  Surgeon: Manus Rudd, MD;  Location: [facility] OR;  Service:  General;  Laterality: N/A;   COLONOSCOPY N/A 09/22/2013   Procedure: COLONOSCOPY;  Surgeon: Corbin Ade, MD;  Location: [facility];  Service: Endoscopy;  Laterality: N/A;  9:30 AM   COLONOSCOPY N/A 02/19/2018   Procedure: COLONOSCOPY;  Surgeon: Corbin Ade, MD;  Location: [facility];  Service: Endoscopy;  Laterality: N/A;  2:00pm   COLOSTOMY N/A 12/13/2017   Procedure: COLOSTOMY;  Surgeon: Manus Rudd, MD;  Location: [facility] OR;  Service: General;  Laterality: N/A;   COLOSTOMY REVERSAL N/A 03/26/2018   Procedure: COLOSTOMY REVERSAL;  Surgeon: Manus Rudd, MD;  Location: [facility] OR;  Service: General;  Laterality: N/A;   CORONARY ARTERY BYPASS GRAFT N/A 10/18/2015   Procedure: CORONARY ARTERY BYPASS GRAFTING (CABG) x  five, using left internal mammary artery and right leg greater saphenous vein harvested endoscopically;  Surgeon: Loreli Slot, MD;  Location: [facility] OR;  Service: Open Heart Surgery;  Laterality: N/A;   ENDARTERECTOMY FEMORAL Right 08/09/2016   Procedure: ENDARTERECTOMY FEMORAL WITH VEIN PATCH ANGIOPLASTY;  Surgeon: Nada Libman, MD;  Location: [facility] OR;  Service: Vascular;  Laterality: Right;   ESOPHAGOGASTRODUODENOSCOPY N/A 10/24/2017   Dr. Jena Gauss: hiatal hernia   FEMORAL-FEMORAL BYPASS GRAFT Bilateral 08/09/2016   Procedure: REVISION BYPASS GRAFT RIGHT FEMORAL-LEFT FEMORAL ARTERY;  Surgeon: Nada Libman, MD;  Location: [facility] OR;  Service: Vascular;  Laterality: Bilateral;   FEMORAL-POPLITEAL BYPASS GRAFT     INTRAMEDULLARY (IM) NAIL INTERTROCHANTERIC Left 04/03/2021   Procedure: LEFT  HIP INTRAMEDULLARY (IM) NAIL INTERTROCHANTRIC;  Surgeon: Kathryne Hitch, MD;  Location: [facility] OR;  Service: Orthopedics;  Laterality: Left;   PERIPHERAL VASCULAR BALLOON ANGIOPLASTY Right 09/22/2021   Procedure: PERIPHERAL VASCULAR BALLOON ANGIOPLASTY;  Surgeon: Cephus Shelling, MD;  Location: [facility] INVASIVE CV LAB;  Service: Cardiovascular;  Laterality: Right;   PERIPHERAL VASCULAR  CATHETERIZATION N/A 05/08/2016   Procedure: Lower Extremity Angiography;  Surgeon: Runell Gess, MD;  Location: [facility] INVASIVE CV LAB;  Service: Cardiovascular;  Laterality: N/A;   PERIPHERAL VASCULAR INTERVENTION Right 09/19/2016   Procedure: Peripheral Vascular Intervention;  Surgeon: Nada Libman, MD;  Location: [facility] INVASIVE CV LAB;  Service: Cardiovascular;  Laterality: Right;  ext iliac stent   PERIPHERAL VASCULAR INTERVENTION Right 09/22/2021   Procedure: PERIPHERAL VASCULAR INTERVENTION;  Surgeon: Cephus Shelling, MD;  Location: [facility] INVASIVE CV LAB;  Service: Cardiovascular;  Laterality: Right;   POLYPECTOMY  02/19/2018   Procedure: POLYPECTOMY;  Surgeon: Corbin Ade, MD;  [facility];  Service: Endoscopy;;   PROCTOSCOPY  10/24/2017   Procedure: PROCTOSCOPY;  Surgeon: Corbin Ade, MD;  [facility];  Service: Endoscopy;;   TEE WITHOUT CARDIOVERSION N/A 10/18/2015   Procedure: TRANSESOPHAGEAL ECHOCARDIOGRAM (TEE);  Surgeon: Loreli Slot, MD;  Location: [facility] OR;  Service: Open Heart Surgery;  Laterality: N/A;    Past Family History   Family History  Problem Relation Age of Onset   Heart attack Mother    Stroke Mother    Colon cancer Brother        late 60s    Past Social History   Social History   Socioeconomic History   Marital status: Married    Spouse name: Not on file   Number of children: Not on file   Years of education: Not on file   Highest education level: Not on file  Occupational History   Not on file  Tobacco Use   Smoking status: Former    Current packs/day: 0.00    Average packs/day: 2.0 packs/day for 30.0 years (60.0 ttl pk-yrs)    Types: Cigarettes    Start date: 08/14/1970    Quit date: 08/14/2000    Years since quitting: 22.6   Smokeless tobacco: Never  Vaping Use   Vaping status: Never Used  Substance and Sexual Activity   Alcohol use: No   Drug use: No   Sexual activity: Not Currently  Other Topics Concern    Not on file  Social History Narrative   Not on file   Social Determinants of Health   Financial Resource Strain: Low Risk  (04/25/2021)   Received from [facility], [facility]   Overall Financial Resource Strain (CARDIA)    Difficulty of Paying Living Expenses: Not very hard  Food Insecurity: No Food Insecurity (04/25/2021)   Received from [facility], [facility]   Hunger Vital Sign    Worried About Running Out of Food in the Last Year: Never true    Ran Out of Food in the Last Year: Never true  Transportation Needs: No Transportation Needs (04/25/2021)   Received from [facility], [facility]   PRAPARE - Transportation    Lack of Transportation (Medical): No    Lack of Transportation (Non-Medical): No  Physical Activity: Not on file  Stress: Not on file  Social Connections: Not on file  Intimate Partner Violence: Not on file    Review of Systems   General: Negative for anorexia, fever, chills, fatigue, weakness. See hpi ENT: Negative for hoarseness, difficulty swallowing , nasal congestion. CV: Negative for chest pain, angina, palpitations, dyspnea on exertion, peripheral edema.  Respiratory: Negative for dyspnea at rest, dyspnea on exertion, cough, sputum, wheezing.  GI: See history of present illness. GU:  Negative for dysuria, hematuria, urinary incontinence, urinary frequency, nocturnal urination.  Endo: Negative for unusual weight change.     Physical Exam   BP (!) 168/66 (BP Location: Right Arm, Patient Position: Sitting, Cuff Size: Normal)   Pulse 73   Temp (!) 97.5 F (36.4 C) (Oral)   Ht 5\' 7"  (1.702 m)   Wt 143 lb 3.2 oz (65 kg)   SpO2 97%   BMI 22.43 kg/m    General: Well-nourished, well-developed in no acute distress.  Eyes: No icterus. Mouth: Oropharyngeal mucosa moist and pink   Lungs: Clear to auscultation bilaterally.  Heart: Regular rate and rhythm, no murmurs rubs or gallops.  Abdomen: Bowel sounds are normal, nontender,  nondistended, no hepatosplenomegaly or masses,  no abdominal bruits or hernia , no rebound or guarding.  Rectal: not  performed Extremities: No lower extremity edema. No clubbing or deformities. Neuro: Alert and oriented x 4   Skin: Warm and dry, no jaundice.   Psych: Alert and cooperative, normal mood and affect.  Labs   Lab Results  Component Value Date   NA 132 (L) 04/13/2023   CL 101 04/13/2023   K 3.3 (L) 04/13/2023   CO2 17 (L) 04/13/2023   BUN 28 (H) 04/13/2023   CREATININE 2.74 (H) 04/13/2023   GFRNONAA 23 (L) 04/13/2023   CALCIUM 7.5 (L) 04/13/2023   PHOS 1.4 (L) 09/15/2018   ALBUMIN 3.5 04/13/2023   GLUCOSE 170 (H) 04/13/2023   Lab Results  Component Value Date   ALT 13 04/13/2023   AST 21 04/13/2023   ALKPHOS 75 04/13/2023   BILITOT 0.8 04/13/2023   Lab Results  Component Value Date   WBC 17.7 (H) 04/13/2023   HGB 9.7 (L) 04/13/2023   HCT 31.5 (L) 04/13/2023   MCV 78.2 (L) 04/13/2023   PLT 405 (H) 04/13/2023    Imaging Studies   CT ABDOMEN PELVIS WO CONTRAST  Result Date: 04/13/2023 CLINICAL DATA:  Unintended weight loss, change in bowel habits EXAM: CT ABDOMEN AND PELVIS WITHOUT CONTRAST TECHNIQUE: Multidetector CT imaging of the abdomen and pelvis was performed following the standard protocol without IV contrast. Unenhanced CT was performed per clinician order. Lack of IV contrast limits sensitivity and specificity, especially for evaluation of abdominal/pelvic solid viscera. RADIATION DOSE REDUCTION: This exam was performed according to the departmental dose-optimization program which includes automated exposure control, adjustment of the mA and/or kV according to patient size and/or use of iterative reconstruction technique. COMPARISON:  12/12/2017 FINDINGS: Lower chest: There is patchy ground-glass airspace disease noted within the right middle and right lower lobe, incompletely evaluated due to slice selection. Chronic scarring at the left lung base.  Hepatobiliary: Small calcified gallstones without evidence of acute cholecystitis. Unremarkable unenhanced appearance of the liver. Pancreas: Unremarkable unenhanced appearance. Spleen: Unremarkable unenhanced appearance. Adrenals/Urinary Tract: Stable right renal cyst does not require specific imaging follow-up. Mild bilateral renal cortical thinning. Extensive vascular calcifications at the renal hila. No urinary tract calculi or obstructive uropathy within either kidney. The adrenals and bladder are unremarkable. Stomach/Bowel: Postsurgical changes from prior sigmoid colon resection and reanastomosis. No bowel obstruction or ileus. Normal appendix right lower quadrant. No bowel wall thickening or inflammatory change. Vascular/Lymphatic: Extensive atherosclerosis of the aorta and its branches again noted. Vascular stents are seen within the right external and common iliac arteries. Postsurgical changes from prior femoral-femoral bypass. Evaluation of the vascular structures is limited without IV contrast. No pathologic adenopathy. Reproductive: Prostate is unremarkable. Other: No free fluid or free intraperitoneal gas. No abdominal wall hernia. Musculoskeletal: Prior ORIF left hip. No acute or destructive bony abnormalities. Reconstructed images demonstrate no additional findings. IMPRESSION: 1. Patchy ground-glass airspace disease within the right middle and right lower lobes, incompletely imaged due to slice selection. This may be inflammatory or infectious. 2. No bowel obstruction or ileus. 3. Cholelithiasis without cholecystitis. 4.  Aortic Atherosclerosis (ICD10-I70.0). Electronically Signed   By: Sharlet Salina M.D.   On: 04/13/2023 17:06   VAS US RENAL ARTERY DUPLEX  Result Date: 03/28/2023 ABDOMINAL VISCERAL Patient Name:  [name]  Date of Exam:   03/28/2023 Medical Rec #: [id number]     Accession #:    [id number] Date of Birth: Aug 16, 1945     Patient Gender: M Patient Age:   63 years Exam Location:   [facility] Procedure:      VAS [place] MESENTERIC Referring Phys: [id number] Graceann Congress -------------------------------------------------------------------------------- Indications: Atherosclerotic disease of renal arteries and mesenteric arteries              seen on CT abdomen pelvis High Risk Factors: Hypertension, hyperlipidemia, Diabetes, past history of                    smoking, coronary artery disease. Limitations: Air/bowel gas. Comparison Study: No prior study Performing Technologist: Gertie Fey MHA, RDMS, RVT, RDCS  Examination Guidelines: A complete evaluation includes B-mode imaging, spectral Doppler, color Doppler, and power Doppler as needed of all accessible portions of each vessel. Bilateral testing is considered an integral part of a complete examination. Limited examinations for reoccurring indications may be performed as noted.  Duplex Findings: +----------------------+--------+--------+------+--------+ Mesenteric            PSV cm/sEDV cm/sPlaqueComments +----------------------+--------+--------+------+--------+ Aorta Prox               93                          +----------------------+--------+--------+------+--------+ Celiac Artery Proximal  540     183                  +----------------------+--------+--------+------+--------+ IMA                     424      61                  +----------------------+--------+--------+------+--------+    +------------------+--------+--------+------------------+ Right Renal ArteryPSV cm/sEDV cm/s     Comment       +------------------+--------+--------+------------------+ Origin               63      9                       +------------------+--------+--------+------------------+ Proximal                          Unable to insonate +------------------+--------+--------+------------------+ Mid                               Unable to insonate  +------------------+--------+--------+------------------+ Distal                            Unable to insonate +------------------+--------+--------+------------------+ +-----------------+--------+--------+------------------+ Left Renal ArteryPSV cm/sEDV cm/s     Comment       +-----------------+--------+--------+------------------+ Origin                           Unable to insonate +-----------------+--------+--------+------------------+ Proximal           121      12                      +-----------------+--------+--------+------------------+ Mid                 75      12                      +-----------------+--------+--------+------------------+ Distal              96      15                      +-----------------+--------+--------+------------------+ +------------+--------+--------+----+-----------+--------+--------+----+  Right KidneyPSV cm/sEDV cm/sRI  Left KidneyPSV cm/sEDV cm/sRI   +------------+--------+--------+----+-----------+--------+--------+----+ Upper Pole                      Upper Pole 35      4       0.89 +------------+--------+--------+----+-----------+--------+--------+----+ Mid         8       3       0.        41      4       0.90 +------------+--------+--------+----+-----------+--------+--------+----+ Lower Pole  9       6       0.33Lower Pole 21      7       0.67 +------------+--------+--------+----+-----------+--------+--------+----+ Hilar       43      7       0.84Hilar      12      1       0.92 +------------+--------+--------+----+-----------+--------+--------+----+ +------------------+---------+------------------+---------+ Right Kidney               Left Kidney                 +------------------+---------+------------------+---------+ RAR                        RAR                         +------------------+---------+------------------+---------+ RAR (manual)      0.67`    RAR (manual)       1.3       +------------------+---------+------------------+---------+ Cortex            18/5 cm/sCortex            20/5 cm/s +------------------+---------+------------------+---------+ Cortex thickness           Corex thickness             +------------------+---------+------------------+---------+ Kidney length (cm)9.51     Kidney length (cm)10.40     +------------------+---------+------------------+---------+  Summary: Renal:  Right: Normal size right kidney. Unable to adequately evaluate the        right intrarenal or renal arteries secondary to overlying        bowel gas and atherosclerotic disease. Left:  Normal size of left kidney. Abnormal left Resisitve Index. No        obvious evidence of hemodynamically significant renal artery        stenosis involving visualized segments of renal artery. Mesenteric: 70 to 99% stenosis in the celiac artery. The Inferior Mesenteric artery appears stenotic. Unable to identify the SMA secondary to overlying bowel gas and atherosclerotic disease.  *See table(s) above for measurements and observations.  Diagnosing physician: Lemar Livings MD  Electronically signed by Lemar Livings MD on 03/28/2023 at 10:33:23 AM.    Final     Assessment   *GERD *Diarrhea *Loss of weight *High-grade stenosis of both renal arteries, celiac trunk, and high grade stenosis and indeterminate occulusion of SMA. *Abnormal prostate on CT 04/2022 *Abnormal T9 on Chest CT *Microcytic anemia  Frequent heartburn difficult to control. Limiting oral intake due to symptoms. He has lost about 12 pounds. He sleeps reclined. He has no daytime dysphagia but feels discomfort in the center of chest after taking his night time pills. He is currently on PPI BID.   He denies postprandial abdominal pain although there is also some concern for mesenteric ischemia based on  CT imaging. He is due to follow up with Dr. Chestine Spore soon.   Ongoing diarrhea occurring now for couple of months. Stools  studies negative for infection on two separate occasions. ?chronic mesenteric ischemia as cause. He is due for surveillance colonoscopy this year, plan for colonoscopy with possible random colon biopsies.   Microcytic anemia, in process of starting iron infusions. Possibly multifactorial with decline in renal function as well. Colonoscopy/egd as planned.    [facility].    PLAN   Colonoscopy/EGD/+/-ED with Dr. Jena Gauss. Try to get asap. ASA 3.  I have discussed the risks, alternatives, benefits with regards to but not limited to the risk of reaction to medication, bleeding, infection, perforation and the patient is agreeable to proceed. Written consent to be obtained. [facility] prostate on [facility]. Patient to keep appt with Dr. Chestine Spore regarding renal arteries and celiac, SMA.  Patient to check with his new home medication for diarrhea. May be cholestyramine. If so, I would advise 2g daily, do not take within 2 hours of other medications. If still with diarrhea after one week, then increase to 4g daily. With further review of chart after patient left the office, he had a Chest CT while in ED 02/2023. "There is a mixed density lesion in the right side of body of T9 vertebra with interval progression. Findings may suggest inflammatory or neoplastic process. If clinically warranted, follow-up MRI may be considered." We will reach out to patient to make sure he follows up on this.   Leanna Battles. Melvyn Neth, MHS, PA-C [facility]

## 2023-04-20 NOTE — H&P (View-Only) (Signed)
GI Office Note    Referring Provider: [facility] Primary Care Physician:  [facility]  Primary Gastroenterologist: Roetta Sessions, MD   Chief Complaint   Chief Complaint  Patient presents with   Colonoscopy    History of Present Illness   [name] is a 77 y.o. male presenting today for follow up. Last seen in 02/2023.   GI history significant for complicated diverticulitis requiring diverting colostomy (May 2019).  In 02/2018, he had a colonoscopy through his colostomy and proctoscopy showing small adenoma which was removed, slated for surveillance colonoscopy in 2024.  Subsequently had colostomy reversal (August 2019).  He has longstanding GERD.  EGD in 2019 with small hiatal hernia.   Historically his reflux has been difficult to manage over the past several months. He is sleeping in a reclined bed. He eats early to allow 3 hours between eating and laying. Down. He is reclined at greater than 45 degree angle when in bed. He avoids eating at time due prevent heartburn. Avoiding food triggers. Has lost 12 pounds. He denies dysphagia to solids and pills except for at night time. When he takes his night time dose of medications, he feels like he has pressure/pain in the center of his chest as if pills are not going down well.  In the ED April 13, 2023 with diarrhea.  For 31-month history of diarrhea with associated weight loss.  Recent stool study with negative C. difficile and GI pathogen panel.  Blood cell count 17,700, hemoglobin 9.7, platelets 405,000, sodium 132, potassium 3.3, BUN 28, creatinine 2.74.  CT abdomen pelvis without contrast with patchy groundglass airspace disease within the right middle lobe and right lower lobes of the lung, cholelithiasis.  He continues to have loose stools. No solid stools. BMs 2-3 per day. Some nocturnal stools. No melena, brbpr. Seen in the ED 8/30 with ongoing diarrhea and dehydration. Received IV fluids. Labs as  outlined below.   Scheduled for follow up with Dr. 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  [facility].   Since we last saw him, his metformin was stopped. He is going for iron infusions, first dose next week.   CT A/P with and without contrast 04/2022: performed due to possible renal mass seen on u/s -no left renal mass. 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 Bilateral renal scarring. -no bladder mass -extensive and severe atherosclerosis, with high-grade stenosis of both renal arteries, high-grade stenosis of the celiac trunk, and high-grade stenosis and indeterminate occulusion of the SMA. The patient is at risk for chronic mesenteric ischemia.  -suspected occlusion of the left common iliac and external iliac arteries, with a fem-fem bypass in place.  -mild prostatomegaly with some asymmetric enhancement in the right peripheral zone of the prostate gland which could be from prior inflammation of prostate cancer.  -Dependent density in the gallbladder favoring small gallstones over slude.  saw chronic mesenteric ichemia  CT A/P without contrast 04/13/23: IMPRESSION: 1. Patchy ground-glass airspace disease within the right middle and right lower lobes, incompletely imaged due to slice selection. This may be inflammatory or infectious. 2. No bowel obstruction or ileus. 3. Cholelithiasis without cholecystitis. 4.  Aortic Atherosclerosis (ICD10-I70.0).  CT Chest without contrast 03/07/23: IMPRESSION: There is dense infiltrate with air bronchogram in the left upper lobe with interval worsening suggesting worsening atelectasis/pneumonia. There are small patchy infiltrates in  the lingula and left lower lobe suggesting possible atelectasis/pneumonia.   Coronary artery calcifications are seen. Aortic arteriosclerosis. Small hiatal hernia. Possible left renal stones.   There is a mixed density  lesion in the right side of body of T9 vertebra with interval progression. Findings may suggest inflammatory or neoplastic process. If clinically warranted, follow-up MRI may be considered.  Colonoscopy 02/2018: -few scattered diverticula in entire residual colon, normal appearing residual rectal stump -one 5mm polyp hepatic flexure removed -next colonoscopy five years   EGD 10/2017: -normal esophagus -small hh  Wt Readings from Last 8 Encounters:  04/20/23 143 lb 3.2 oz (65 kg)  03/07/23 149 lb 14.6 oz (68 kg)  02/20/23 150 lb 12.8 oz (68.4 kg)  01/09/23 151 lb (68.5 kg)  11/22/22 152 lb 9.6 oz (69.2 kg)  10/05/22 153 lb (69.4 kg)  08/29/22 152 lb (68.9 kg)  08/15/22 155 lb (70.3 kg)     Medications   Current Outpatient Medications  Medication Sig Dispense Refill   amLODipine (NORVASC) 10 MG tablet Take 10 mg by mouth in the morning.     aspirin EC 81 MG tablet Take 1 tablet (81 mg total) by mouth daily with breakfast. 30 tablet 2   atorvastatin (LIPITOR) 40 MG tablet Take 1 tablet (40 mg total) by mouth daily at 6 PM. (Patient taking differently: Take 40 mg by mouth at bedtime.) 30 tablet 1   cilostazol (PLETAL) 100 MG tablet Take 100 mg by mouth 2 (two) times daily.     clopidogrel (PLAVIX) 75 MG tablet Take 75 mg by mouth in the morning.     empagliflozin (JARDIANCE) 25 MG TABS tablet Take 12.5 mg by mouth in the morning.     hydrochlorothiazide (HYDRODIURIL) 12.5 MG tablet Take 12.5 mg by mouth in the morning.     insulin glargine (LANTUS) 100 UNIT/ML injection Inject 20 Units into the skin at bedtime.     losartan (COZAAR) 50 MG tablet Take 50 mg by mouth daily.     metoprolol tartrate (LOPRESSOR) 50 MG tablet Take 25 mg by mouth 2 (two) times daily.      pantoprazole (PROTONIX) 40 MG tablet Take 1 tablet (40 mg total) by mouth daily. 90 tablet 3   pregabalin (LYRICA) 75 MG capsule Take 75 mg by mouth 2 (two) times daily.     vitamin B-12 (CYANOCOBALAMIN) 1000 MCG tablet  Take 1,000 mcg by mouth in the morning.     No current facility-administered medications for this visit.    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   Hypercholesteremia    Hypertension    Neuropathy    Peripheral vascular disease (HCC)    s/p R-L FEM-FEM BYPASS   Pneumonia    Tachycardia    after CABG, pt on medicine for this    Past Surgical History   Past Surgical History:  Procedure Laterality Date   ABDOMINAL AORTOGRAM Bilateral 09/19/2016   Procedure: iliac;  Surgeon: Nada Libman, MD;  Location: [facility] INVASIVE CV LAB;  Service: Cardiovascular;  Laterality: Bilateral;   ABDOMINAL AORTOGRAM W/LOWER EXTREMITY N/A 09/22/2021   Procedure: ABDOMINAL AORTOGRAM W/LOWER EXTREMITY;  Surgeon: Cephus Shelling, MD;  Location: [facility] INVASIVE CV LAB;  Service: Cardiovascular;  Laterality: N/A;   ABDOMINAL AORTOGRAM W/LOWER EXTREMITY N/A 10/05/2022   Procedure: ABDOMINAL AORTOGRAM W/LOWER EXTREMITY;  Surgeon: Cephus Shelling, MD;  Location: [facility] INVASIVE CV LAB;  Service: Cardiovascular;  Laterality: N/A;   CARDIAC CATHETERIZATION  2003   with stent   CARDIAC CATHETERIZATION N/A 10/11/2015   Procedure: Left Heart Cath and Coronary Angiography;  Surgeon: Peter M Swaziland, MD;  Location: [facility] INVASIVE CV LAB;  Service: Cardiovascular;  Laterality: N/A;   COLON RESECTION N/A 12/13/2017   Procedure: EXPLORATORY LAPAROTOMY, SIGMOID COLECTOMY WITH COLOSTOMY;  Surgeon: Manus Rudd, MD;  Location: [facility] OR;  Service:  General;  Laterality: N/A;   COLONOSCOPY N/A 09/22/2013   Procedure: COLONOSCOPY;  Surgeon: Corbin Ade, MD;  Location: [facility];  Service: Endoscopy;  Laterality: N/A;  9:30 AM   COLONOSCOPY N/A 02/19/2018   Procedure: COLONOSCOPY;  Surgeon: Corbin Ade, MD;  Location: [facility];  Service: Endoscopy;  Laterality: N/A;  2:00pm   COLOSTOMY N/A 12/13/2017   Procedure: COLOSTOMY;  Surgeon: Manus Rudd, MD;  Location: [facility] OR;  Service: General;  Laterality: N/A;   COLOSTOMY REVERSAL N/A 03/26/2018   Procedure: COLOSTOMY REVERSAL;  Surgeon: Manus Rudd, MD;  Location: [facility] OR;  Service: General;  Laterality: N/A;   CORONARY ARTERY BYPASS GRAFT N/A 10/18/2015   Procedure: CORONARY ARTERY BYPASS GRAFTING (CABG) x  five, using left internal mammary artery and right leg greater saphenous vein harvested endoscopically;  Surgeon: Loreli Slot, MD;  Location: [facility] OR;  Service: Open Heart Surgery;  Laterality: N/A;   ENDARTERECTOMY FEMORAL Right 08/09/2016   Procedure: ENDARTERECTOMY FEMORAL WITH VEIN PATCH ANGIOPLASTY;  Surgeon: Nada Libman, MD;  Location: [facility] OR;  Service: Vascular;  Laterality: Right;   ESOPHAGOGASTRODUODENOSCOPY N/A 10/24/2017   Dr. Jena Gauss: hiatal hernia   FEMORAL-FEMORAL BYPASS GRAFT Bilateral 08/09/2016   Procedure: REVISION BYPASS GRAFT RIGHT FEMORAL-LEFT FEMORAL ARTERY;  Surgeon: Nada Libman, MD;  Location: [facility] OR;  Service: Vascular;  Laterality: Bilateral;   FEMORAL-POPLITEAL BYPASS GRAFT     INTRAMEDULLARY (IM) NAIL INTERTROCHANTERIC Left 04/03/2021   Procedure: LEFT  HIP INTRAMEDULLARY (IM) NAIL INTERTROCHANTRIC;  Surgeon: Kathryne Hitch, MD;  Location: [facility] OR;  Service: Orthopedics;  Laterality: Left;   PERIPHERAL VASCULAR BALLOON ANGIOPLASTY Right 09/22/2021   Procedure: PERIPHERAL VASCULAR BALLOON ANGIOPLASTY;  Surgeon: Cephus Shelling, MD;  Location: [facility] INVASIVE CV LAB;  Service: Cardiovascular;  Laterality: Right;   PERIPHERAL VASCULAR  CATHETERIZATION N/A 05/08/2016   Procedure: Lower Extremity Angiography;  Surgeon: Runell Gess, MD;  Location: [facility] INVASIVE CV LAB;  Service: Cardiovascular;  Laterality: N/A;   PERIPHERAL VASCULAR INTERVENTION Right 09/19/2016   Procedure: Peripheral Vascular Intervention;  Surgeon: Nada Libman, MD;  Location: [facility] INVASIVE CV LAB;  Service: Cardiovascular;  Laterality: Right;  ext iliac stent   PERIPHERAL VASCULAR INTERVENTION Right 09/22/2021   Procedure: PERIPHERAL VASCULAR INTERVENTION;  Surgeon: Cephus Shelling, MD;  Location: [facility] INVASIVE CV LAB;  Service: Cardiovascular;  Laterality: Right;   POLYPECTOMY  02/19/2018   Procedure: POLYPECTOMY;  Surgeon: Corbin Ade, MD;  [facility];  Service: Endoscopy;;   PROCTOSCOPY  10/24/2017   Procedure: PROCTOSCOPY;  Surgeon: Corbin Ade, MD;  [facility];  Service: Endoscopy;;   TEE WITHOUT CARDIOVERSION N/A 10/18/2015   Procedure: TRANSESOPHAGEAL ECHOCARDIOGRAM (TEE);  Surgeon: Loreli Slot, MD;  Location: [facility] OR;  Service: Open Heart Surgery;  Laterality: N/A;    Past Family History   Family History  Problem Relation Age of Onset   Heart attack Mother    Stroke Mother    Colon cancer Brother        late 60s    Past Social History   Social History   Socioeconomic History   Marital status: Married    Spouse name: Not on file   Number of children: Not on file   Years of education: Not on file   Highest education level: Not on file  Occupational History   Not on file  Tobacco Use   Smoking status: Former    Current packs/day: 0.00    Average packs/day: 2.0 packs/day for 30.0 years (60.0 ttl pk-yrs)    Types: Cigarettes    Start date: 08/14/1970    Quit date: 08/14/2000    Years since quitting: 22.6   Smokeless tobacco: Never  Vaping Use   Vaping status: Never Used  Substance and Sexual Activity   Alcohol use: No   Drug use: No   Sexual activity: Not Currently  Other Topics Concern    Not on file  Social History Narrative   Not on file   Social Determinants of Health   Financial Resource Strain: Low Risk  (04/25/2021)   Received from [facility], [facility]   Overall Financial Resource Strain (CARDIA)    Difficulty of Paying Living Expenses: Not very hard  Food Insecurity: No Food Insecurity (04/25/2021)   Received from [facility], [facility]   Hunger Vital Sign    Worried About Running Out of Food in the Last Year: Never true    Ran Out of Food in the Last Year: Never true  Transportation Needs: No Transportation Needs (04/25/2021)   Received from [facility], [facility]   PRAPARE - Transportation    Lack of Transportation (Medical): No    Lack of Transportation (Non-Medical): No  Physical Activity: Not on file  Stress: Not on file  Social Connections: Not on file  Intimate Partner Violence: Not on file    Review of Systems   General: Negative for anorexia, fever, chills, fatigue, weakness. See hpi ENT: Negative for hoarseness, difficulty swallowing , nasal congestion. CV: Negative for chest pain, angina, palpitations, dyspnea on exertion, peripheral edema.  Respiratory: Negative for dyspnea at rest, dyspnea on exertion, cough, sputum, wheezing.  GI: See history of present illness. GU:  Negative for dysuria, hematuria, urinary incontinence, urinary frequency, nocturnal urination.  Endo: Negative for unusual weight change.     Physical Exam   BP (!) 168/66 (BP Location: Right Arm, Patient Position: Sitting, Cuff Size: Normal)   Pulse 73   Temp (!) 97.5 F (36.4 C) (Oral)   Ht 5\' 7"  (1.702 m)   Wt 143 lb 3.2 oz (65 kg)   SpO2 97%   BMI 22.43 kg/m    General: Well-nourished, well-developed in no acute distress.  Eyes: No icterus. Mouth: Oropharyngeal mucosa moist and pink   Lungs: Clear to auscultation bilaterally.  Heart: Regular rate and rhythm, no murmurs rubs or gallops.  Abdomen: Bowel sounds are normal, nontender,  nondistended, no hepatosplenomegaly or masses,  no abdominal bruits or hernia , no rebound or guarding.  Rectal: not  performed Extremities: No lower extremity edema. No clubbing or deformities. Neuro: Alert and oriented x 4   Skin: Warm and dry, no jaundice.   Psych: Alert and cooperative, normal mood and affect.  Labs   Lab Results  Component Value Date   NA 132 (L) 04/13/2023   CL 101 04/13/2023   K 3.3 (L) 04/13/2023   CO2 17 (L) 04/13/2023   BUN 28 (H) 04/13/2023   CREATININE 2.74 (H) 04/13/2023   GFRNONAA 23 (L) 04/13/2023   CALCIUM 7.5 (L) 04/13/2023   PHOS 1.4 (L) 09/15/2018   ALBUMIN 3.5 04/13/2023   GLUCOSE 170 (H) 04/13/2023   Lab Results  Component Value Date   ALT 13 04/13/2023   AST 21 04/13/2023   ALKPHOS 75 04/13/2023   BILITOT 0.8 04/13/2023   Lab Results  Component Value Date   WBC 17.7 (H) 04/13/2023   HGB 9.7 (L) 04/13/2023   HCT 31.5 (L) 04/13/2023   MCV 78.2 (L) 04/13/2023   PLT 405 (H) 04/13/2023    Imaging Studies   CT ABDOMEN PELVIS WO CONTRAST  Result Date: 04/13/2023 CLINICAL DATA:  Unintended weight loss, change in bowel habits EXAM: CT ABDOMEN AND PELVIS WITHOUT CONTRAST TECHNIQUE: Multidetector CT imaging of the abdomen and pelvis was performed following the standard protocol without IV contrast. Unenhanced CT was performed per clinician order. Lack of IV contrast limits sensitivity and specificity, especially for evaluation of abdominal/pelvic solid viscera. RADIATION DOSE REDUCTION: This exam was performed according to the departmental dose-optimization program which includes automated exposure control, adjustment of the mA and/or kV according to patient size and/or use of iterative reconstruction technique. COMPARISON:  12/12/2017 FINDINGS: Lower chest: There is patchy ground-glass airspace disease noted within the right middle and right lower lobe, incompletely evaluated due to slice selection. Chronic scarring at the left lung base.  Hepatobiliary: Small calcified gallstones without evidence of acute cholecystitis. Unremarkable unenhanced appearance of the liver. Pancreas: Unremarkable unenhanced appearance. Spleen: Unremarkable unenhanced appearance. Adrenals/Urinary Tract: Stable right renal cyst does not require specific imaging follow-up. Mild bilateral renal cortical thinning. Extensive vascular calcifications at the renal hila. No urinary tract calculi or obstructive uropathy within either kidney. The adrenals and bladder are unremarkable. Stomach/Bowel: Postsurgical changes from prior sigmoid colon resection and reanastomosis. No bowel obstruction or ileus. Normal appendix right lower quadrant. No bowel wall thickening or inflammatory change. Vascular/Lymphatic: Extensive atherosclerosis of the aorta and its branches again noted. Vascular stents are seen within the right external and common iliac arteries. Postsurgical changes from prior femoral-femoral bypass. Evaluation of the vascular structures is limited without IV contrast. No pathologic adenopathy. Reproductive: Prostate is unremarkable. Other: No free fluid or free intraperitoneal gas. No abdominal wall hernia. Musculoskeletal: Prior ORIF left hip. No acute or destructive bony abnormalities. Reconstructed images demonstrate no additional findings. IMPRESSION: 1. Patchy ground-glass airspace disease within the right middle and right lower lobes, incompletely imaged due to slice selection. This may be inflammatory or infectious. 2. No bowel obstruction or ileus. 3. Cholelithiasis without cholecystitis. 4.  Aortic Atherosclerosis (ICD10-I70.0). Electronically Signed   By: Sharlet Salina M.D.   On: 04/13/2023 17:06   VAS US RENAL ARTERY DUPLEX  Result Date: 03/28/2023 ABDOMINAL VISCERAL Patient Name:  [name]  Date of Exam:   03/28/2023 Medical Rec #: [id number]     Accession #:    [id number] Date of Birth: Aug 16, 1945     Patient Gender: M Patient Age:   63 years Exam Location:   [facility] Procedure:      VAS [place] MESENTERIC Referring Phys: [id number] Graceann Congress -------------------------------------------------------------------------------- Indications: Atherosclerotic disease of renal arteries and mesenteric arteries              seen on CT abdomen pelvis High Risk Factors: Hypertension, hyperlipidemia, Diabetes, past history of                    smoking, coronary artery disease. Limitations: Air/bowel gas. Comparison Study: No prior study Performing Technologist: Gertie Fey MHA, RDMS, RVT, RDCS  Examination Guidelines: A complete evaluation includes B-mode imaging, spectral Doppler, color Doppler, and power Doppler as needed of all accessible portions of each vessel. Bilateral testing is considered an integral part of a complete examination. Limited examinations for reoccurring indications may be performed as noted.  Duplex Findings: +----------------------+--------+--------+------+--------+ Mesenteric            PSV cm/sEDV cm/sPlaqueComments +----------------------+--------+--------+------+--------+ Aorta Prox               93                          +----------------------+--------+--------+------+--------+ Celiac Artery Proximal  540     183                  +----------------------+--------+--------+------+--------+ IMA                     424      61                  +----------------------+--------+--------+------+--------+    +------------------+--------+--------+------------------+ Right Renal ArteryPSV cm/sEDV cm/s     Comment       +------------------+--------+--------+------------------+ Origin               63      9                       +------------------+--------+--------+------------------+ Proximal                          Unable to insonate +------------------+--------+--------+------------------+ Mid                               Unable to insonate  +------------------+--------+--------+------------------+ Distal                            Unable to insonate +------------------+--------+--------+------------------+ +-----------------+--------+--------+------------------+ Left Renal ArteryPSV cm/sEDV cm/s     Comment       +-----------------+--------+--------+------------------+ Origin                           Unable to insonate +-----------------+--------+--------+------------------+ Proximal           121      12                      +-----------------+--------+--------+------------------+ Mid                 75      12                      +-----------------+--------+--------+------------------+ Distal              96      15                      +-----------------+--------+--------+------------------+ +------------+--------+--------+----+-----------+--------+--------+----+  Right KidneyPSV cm/sEDV cm/sRI  Left KidneyPSV cm/sEDV cm/sRI   +------------+--------+--------+----+-----------+--------+--------+----+ Upper Pole                      Upper Pole 35      4       0.89 +------------+--------+--------+----+-----------+--------+--------+----+ Mid         8       3       0.        41      4       0.90 +------------+--------+--------+----+-----------+--------+--------+----+ Lower Pole  9       6       0.33Lower Pole 21      7       0.67 +------------+--------+--------+----+-----------+--------+--------+----+ Hilar       43      7       0.84Hilar      12      1       0.92 +------------+--------+--------+----+-----------+--------+--------+----+ +------------------+---------+------------------+---------+ Right Kidney               Left Kidney                 +------------------+---------+------------------+---------+ RAR                        RAR                         +------------------+---------+------------------+---------+ RAR (manual)      0.67`    RAR (manual)       1.3       +------------------+---------+------------------+---------+ Cortex            18/5 cm/sCortex            20/5 cm/s +------------------+---------+------------------+---------+ Cortex thickness           Corex thickness             +------------------+---------+------------------+---------+ Kidney length (cm)9.51     Kidney length (cm)10.40     +------------------+---------+------------------+---------+  Summary: Renal:  Right: Normal size right kidney. Unable to adequately evaluate the        right intrarenal or renal arteries secondary to overlying        bowel gas and atherosclerotic disease. Left:  Normal size of left kidney. Abnormal left Resisitve Index. No        obvious evidence of hemodynamically significant renal artery        stenosis involving visualized segments of renal artery. Mesenteric: 70 to 99% stenosis in the celiac artery. The Inferior Mesenteric artery appears stenotic. Unable to identify the SMA secondary to overlying bowel gas and atherosclerotic disease.  *See table(s) above for measurements and observations.  Diagnosing physician: Lemar Livings MD  Electronically signed by Lemar Livings MD on 03/28/2023 at 10:33:23 AM.    Final     Assessment   *GERD *Diarrhea *Loss of weight *High-grade stenosis of both renal arteries, celiac trunk, and high grade stenosis and indeterminate occulusion of SMA. *Abnormal prostate on CT 04/2022 *Abnormal T9 on Chest CT *Microcytic anemia  Frequent heartburn difficult to control. Limiting oral intake due to symptoms. He has lost about 12 pounds. He sleeps reclined. He has no daytime dysphagia but feels discomfort in the center of chest after taking his night time pills. He is currently on PPI BID.   He denies postprandial abdominal pain although there is also some concern for mesenteric ischemia based on  CT imaging. He is due to follow up with Dr. Chestine Spore soon.   Ongoing diarrhea occurring now for couple of months. Stools  studies negative for infection on two separate occasions. ?chronic mesenteric ischemia as cause. He is due for surveillance colonoscopy this year, plan for colonoscopy with possible random colon biopsies.   Microcytic anemia, in process of starting iron infusions. Possibly multifactorial with decline in renal function as well. Colonoscopy/egd as planned.    [facility].    PLAN   Colonoscopy/EGD/+/-ED with Dr. Jena Gauss. Try to get asap. ASA 3.  I have discussed the risks, alternatives, benefits with regards to but not limited to the risk of reaction to medication, bleeding, infection, perforation and the patient is agreeable to proceed. Written consent to be obtained. [facility] prostate on [facility]. Patient to keep appt with Dr. Chestine Spore regarding renal arteries and celiac, SMA.  Patient to check with his new home medication for diarrhea. May be cholestyramine. If so, I would advise 2g daily, do not take within 2 hours of other medications. If still with diarrhea after one week, then increase to 4g daily. With further review of chart after patient left the office, he had a Chest CT while in ED 02/2023. "There is a mixed density lesion in the right side of body of T9 vertebra with interval progression. Findings may suggest inflammatory or neoplastic process. If clinically warranted, follow-up MRI may be considered." We will reach out to patient to make sure he follows up on this.   Leanna Battles. Melvyn Neth, MHS, PA-C [facility]

## 2023-04-20 NOTE — Telephone Encounter (Signed)
With further review of chart after patient left the office, he had a Chest CT while in ED 02/2023. "There is a mixed density lesion in the right side of body of T9 vertebra with interval progression. Findings may suggest inflammatory or neoplastic process. If clinically warranted, follow-up MRI may be considered."   Please let pt know that it is important that he see PCP for this finding in the next couple of weeks so it can be addressed.

## 2023-04-20 NOTE — Patient Instructions (Addendum)
We will work on scheduling your colonoscopy and upper endoscopy.  Please discuss [facility] with [facility]. Previous CT scan last year said your prostate with enlarge and assymetric. This is from the report "mild prostatomegaly with some asymmetric enhancement in the right peripheral zone of the prostate gland which could be from prior inflammation or prostate cancer." Please keep appointment with Dr. 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   Check the powder at home, if you have cholesytramine, I would suggest taking 1/2 scoop (2 grams) mixed in 4-6 ounces of liquid once daily for diarrhea. Do not take within 2 hours of other medications. If you are still having diarrhea, you can increase dose to 1 scoop (4 grams) daily. Please call me if you have any questions or concerns, you can reach me by contacting my CMA Tammy at [phone].

## 2023-04-20 NOTE — Telephone Encounter (Signed)
Please let me know what schedule looks like for getting patient TCS/EGD+/-ED with Dr. Jena Gauss. ASA 3. See in the office today and orders provided. He is in and out of ED, most recently one week ago for persisting diarrhea with negative stool studies. Trying to get him done asap.  Not sure if Dr. Jena Gauss can advise so we can get this guy done within the next couple of weeks.   Prefer to avoid interruption of Plavix due to his significant vascular disease therefore attempting to keep procedures with his primary GI Dr. Jena Gauss.

## 2023-04-23 ENCOUNTER — Telehealth: Payer: Self-pay | Admitting: Internal Medicine

## 2023-04-23 NOTE — Telephone Encounter (Signed)
Spoke to [employer] at [facility] office at the [place]... the authorization we have is valid for pt to have colonoscopy - dates are 9/6 thru 07/19/23

## 2023-04-23 NOTE — Telephone Encounter (Signed)
Spoke with Shawna Orleans and no where for patient with Dr. Jena Gauss until 10/2 as he is only in room 3, 3 days this month and is completely full. Only way would be if he can be considered ASA 1-2 and placed in room 1 for procedure to be done sooner. Please advise thanks!

## 2023-04-23 NOTE — Telephone Encounter (Signed)
Pt was made aware and verbalized understanding. Pt also wants to know if cholestyramine is ok for him to take, states that he was prescribed it by the [place].

## 2023-04-24 ENCOUNTER — Encounter (HOSPITAL_COMMUNITY)
Admission: RE | Admit: 2023-04-24 | Discharge: 2023-04-24 | Disposition: A | Discharge status: Home / Self Care | Payer: No Typology Code available for payment source | Source: Ambulatory Visit | Attending: Nephrology | Admitting: Nephrology

## 2023-04-24 DIAGNOSIS — N189 Chronic kidney disease, unspecified: Secondary | ICD-10-CM | POA: Diagnosis present

## 2023-04-24 DIAGNOSIS — D631 Anemia in chronic kidney disease: Secondary | ICD-10-CM | POA: Diagnosis not present

## 2023-04-24 MED ORDER — SODIUM CHLORIDE 0.9 % IV SOLN
200.0000 mg | INTRAVENOUS | Status: DC
  Administered 2023-04-24: 200 mg via INTRAVENOUS
  Filled 2023-04-24: qty 200

## 2023-04-26 MED ORDER — PEG 3350-KCL-NA BICARB-NACL 420 G PO SOLR: 4000.0000 mL | Freq: Once | ORAL | 0 refills | Status: AC

## 2023-04-26 NOTE — Telephone Encounter (Addendum)
He is ASA 3.    Wonder if Dr. Jena Gauss is going to have a ASA 3 day open up based on group teams message from Reba?  Mindy, he is also now on cholesytramine so he will need to hold it and any other antidiarrhea medications for at least 3 days before the clear liquids/prep begins.

## 2023-04-26 NOTE — Telephone Encounter (Signed)
Spoke with pt. He has been scheduled for 10/2. Aware will go to pre-op appt and will be told arrival at pre-op. Will send instructions via mychart and rx for prep will be sent to [facility]. Aware medications to hold and when

## 2023-04-26 NOTE — Addendum Note (Signed)
Addended by: Armstead Peaks on: 04/26/2023 02:11 PM   Modules accepted: Orders

## 2023-04-26 NOTE — Telephone Encounter (Signed)
Yes as advised in my ov note and patient instructions. For cholesytramine would advise he try 2grams daily not to take within 2 hours of other medications. But if still with diarrhea after trying that dose he can go up to 4 grams daily.   See patient instructions for OV note 04/20/23.

## 2023-04-26 NOTE — Telephone Encounter (Signed)
[name], official orders.   TCS/EGD+/-ED with possible random colon biopsies with [facility]. ASA 3.   PLEASE USE TRILYTE AND TAP WATER ENEMAS DUE TO CHRONIC KIDNEY DISEASE  Hold Jardiance 72 hours. Hold cholesytramine and other anti-diarrhea medications for four days before procedures.  Day of prep: lantus 10 units at bedtime AM of procedures: hold lantus  Dx: GERD, dysphagia, weight loss, abdominal pain, diarrhea, microcytic anemia

## 2023-04-26 NOTE — Addendum Note (Signed)
Addended by: Tiffany Kocher on: 04/26/2023 02:01 PM   Modules accepted: Orders

## 2023-04-27 ENCOUNTER — Encounter: Payer: Self-pay | Admitting: *Deleted

## 2023-04-27 NOTE — Telephone Encounter (Signed)
Lmom for pt to return call

## 2023-04-30 NOTE — Telephone Encounter (Signed)
Pt was made aware and verbalized understanding.

## 2023-05-01 ENCOUNTER — Encounter: Payer: Self-pay | Admitting: Vascular Surgery

## 2023-05-01 ENCOUNTER — Ambulatory Visit: Payer: No Typology Code available for payment source | Admitting: Vascular Surgery

## 2023-05-01 ENCOUNTER — Encounter (HOSPITAL_COMMUNITY)
Admission: RE | Admit: 2023-05-01 | Discharge: 2023-05-01 | Discharge status: Home / Self Care | Payer: No Typology Code available for payment source | Source: Ambulatory Visit | Attending: Nephrology

## 2023-05-01 VITALS — BP 163/67 | HR 78 | Temp 97.8°F | Resp 18 | Ht 67.0 in | Wt 141.4 lb

## 2023-05-01 DIAGNOSIS — K551 Chronic vascular disorders of intestine: Secondary | ICD-10-CM

## 2023-05-01 DIAGNOSIS — N189 Chronic kidney disease, unspecified: Secondary | ICD-10-CM | POA: Diagnosis not present

## 2023-05-01 MED ORDER — SODIUM CHLORIDE 0.9 % IV SOLN
200.0000 mg | INTRAVENOUS | Status: DC
  Administered 2023-05-01: 200 mg via INTRAVENOUS
  Filled 2023-05-01: qty 200

## 2023-05-01 NOTE — Progress Notes (Signed)
Patient name: [name] MRN: [id number] DOB: 03/30/1946 Sex: male  REASON FOR CONSULT: Evaluate for chronic mesenteric ischemia/renal artery stenosis  HPI: [name] is a 77 y.o. male, with history of coronary artery disease, diabetes, hypertension, hyperlipidemia, CKD, history of femoral-femoral bypass that presents for evaluation of chronic mesenteric ischemia (and also renal artery stenosis).  Best I can understand he is referred back by GI.  Patient states he lost about 15 pounds over the last 3 months but states this is mostly due to vomiting and he has severe reflux.  He gets no postprandial abdominal pain.  He looks forward to eating.  He is scheduled for EGD and colonoscopy in early October on October 2 exactly.  He also has follow-up with his nephrologist next month.  Last creatinine in our system was 2.8 on 04/28/23.  IN 09/2022 it was 2.4.  He had a femorofemoral bypass with Dr. Madilyn Fireman years ago.  As recently as 08/10/2016 he had a redo right femoral artery exposure with iliofemoral endarterectomy bovine patch and redo right limb of his femorofemoral bypass by Dr. Myra Gianotti.  On 09/19/2016 he then had ultrasound-guided access of left brachial artery with angioplasty of the right common femoral artery, stent of the right external iliac, and stent of the right common femoral artery also by Dr. Myra Gianotti.  I then performed right common iliac artery angioplasty and stenting with a 7 x 29 VBX as well as a drug-coated balloon angioplasty of the right external iliac artery on 09/22/21 for a recurrent high grade stenosis.      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    Hypercholesteremia    Hypertension    Neuropathy    Peripheral vascular disease (HCC)    s/p R-L FEM-FEM BYPASS   Pneumonia    Tachycardia    after CABG, pt on medicine for this    Past Surgical History:  Procedure Laterality Date   ABDOMINAL AORTOGRAM Bilateral 09/19/2016   Procedure: iliac;  Surgeon: Nada Libman, MD;  [facility] IN[facility];  Service: Cardiovascular;  Laterality: Bilateral;   ABDOMINAL AORTOGRAM W/LOWER EXTREMITY N/A 09/22/2021   Procedure: ABDOMINAL AORTOGRAM W/LOWER EXTREMITY;  Surgeon: Cephus Shelling, MD;  Location: [facility];  Service: Cardiovascular;  Laterality: N/A;   ABDOMINAL AORTOGRAM W/LOWER EXTREMITY N/A 10/05/2022   Procedure: ABDOMINAL AORTOGRAM W/LOWER EXTREMITY;  Surgeon: Cephus Shelling, MD;  Location: [facility];  Service: Cardiovascular;  Laterality: N/A;   CARDIAC CATHETERIZATION  2003   with stent   CARDIAC CATHETERIZATION N/A 10/11/2015   Procedure: Left Heart Cath and Coronary Angiography;  Surgeon: Peter M Swaziland, MD;  Location: [facility] INVASIVE CV LAB;  Service: Cardiovascular;  Laterality: N/A;   COLON RESECTION N/A 12/13/2017   Procedure: EXPLORATORY LAPAROTOMY, SIGMOID COLECTOMY WITH COLOSTOMY;  Surgeon: Manus Rudd, MD;  Location: [facility] OR;  Service: General;  Laterality: N/A;   COLONOSCOPY N/A 09/22/2013   Procedure: COLONOSCOPY;  Surgeon: Corbin Ade, MD;  [facility]: [facility];  Service: Endoscopy;  Laterality: N/A;  9:30 AM   COLONOSCOPY N/A 02/19/2018   Procedure:  COLONOSCOPY;  Surgeon: Corbin Ade, MD;  [facility];  Service: Endoscopy;  Laterality: N/A;  2:00pm   COLOSTOMY N/A 12/13/2017   Procedure: COLOSTOMY;  Surgeon: Manus Rudd, MD;  Location: [facility] OR;  Service: General;  Laterality: N/A;   COLOSTOMY REVERSAL N/A 03/26/2018   Procedure: COLOSTOMY REVERSAL;  Surgeon: Manus Rudd, MD;  Location: [facility] OR;  Service: General;  Laterality: N/A;   CORONARY ARTERY BYPASS GRAFT N/A 10/18/2015    Procedure: CORONARY ARTERY BYPASS GRAFTING (CABG) x  five, using left internal mammary artery and right leg greater saphenous vein harvested endoscopically;  Surgeon: Loreli Slot, MD;  Location: [facility] OR;  Service: Open Heart Surgery;  Laterality: N/A;   ENDARTERECTOMY FEMORAL Right 08/09/2016   Procedure: ENDARTERECTOMY FEMORAL WITH VEIN PATCH ANGIOPLASTY;  Surgeon: Nada Libman, MD;  Location: [facility] OR;  Service: Vascular;  Laterality: Right;   ESOPHAGOGASTRODUODENOSCOPY N/A 10/24/2017   Dr. Jena Gauss: hiatal hernia   FEMORAL-FEMORAL BYPASS GRAFT Bilateral 08/09/2016   Procedure: REVISION BYPASS GRAFT RIGHT FEMORAL-LEFT FEMORAL ARTERY;  Surgeon: Nada Libman, MD;  Location: [facility] OR;  Service: Vascular;  Laterality: Bilateral;   FEMORAL-POPLITEAL BYPASS GRAFT     INTRAMEDULLARY (IM) NAIL INTERTROCHANTERIC Left 04/03/2021   Procedure: LEFT  HIP INTRAMEDULLARY (IM) NAIL INTERTROCHANTRIC;  Surgeon: Kathryne Hitch, MD;  Location: [facility] OR;  Service: Orthopedics;  Laterality: Left;   PERIPHERAL VASCULAR BALLOON ANGIOPLASTY Right 09/22/2021   Procedure: PERIPHERAL VASCULAR BALLOON ANGIOPLASTY;  Surgeon: Cephus Shelling, MD;  [facility];  Service: Cardiovascular;  Laterality: Right;   PERIPHERAL VASCULAR CATHETERIZATION N/A 05/08/2016   Procedure: Lower Extremity Angiography;  Surgeon: Runell Gess, MD;  Location: [facility] INVASIVE CV LAB;  Service: Cardiovascular;  Laterality: N/A;   PERIPHERAL VASCULAR INTERVENTION Right 09/19/2016   Procedure: Peripheral Vascular Intervention;  Surgeon: Nada Libman, MD;  [facility];  Service: Cardiovascular;  Laterality: Right;  ext iliac stent   PERIPHERAL VASCULAR INTERVENTION Right 09/22/2021   Procedure: PERIPHERAL VASCULAR INTERVENTION;  Surgeon: Cephus Shelling, MD;  [facility];  Service: Cardiovascular;  Laterality: Right;   POLYPECTOMY  02/19/2018   Procedure: POLYPECTOMY;  Surgeon: Corbin Ade, MD;  [facility];  Service: Endoscopy;;   PROCTOSCOPY  10/24/2017   Procedure: PROCTOSCOPY;  Surgeon: Corbin Ade, MD;  [facility];  Service: Endoscopy;;   TEE WITHOUT CARDIOVERSION N/A 10/18/2015   Procedure: TRANSESOPHAGEAL ECHOCARDIOGRAM (TEE);  Surgeon: Loreli Slot, MD;  Location: [facility] OR;  Service: Open Heart Surgery;  Laterality: N/A;    Family History  Problem Relation Age of Onset   Heart attack Mother    Stroke Mother    Colon cancer Brother        late 69s    SOCIAL HISTORY: Social History   Socioeconomic History   Marital status: Married    Spouse name: Not on file   Number of children: Not on file   Years of education: Not on file   Highest education level: Not on file  Occupational History   Not on file  Tobacco Use   Smoking status: Former    Current packs/day: 0.00    Average packs/day: 2.0 packs/day for 30.0 years (60.0 ttl pk-yrs)    Types: Cigarettes    Start date: 08/14/1970    Quit date: 08/14/2000    Years since quitting: 22.7   Smokeless tobacco: Never  Vaping Use   Vaping status:  Never Used  Substance and Sexual Activity   Alcohol use: No   Drug use: No   Sexual activity: Not Currently  Other Topics Concern   Not on file  Social History Narrative   Not on file   Social Determinants of Health   Financial Resource Strain: Low Risk  (04/25/2021)   Received from [facility], [facility]   Overall Financial Resource Strain (CARDIA)    Difficulty of Paying Living Expenses: Not very hard  Food Insecurity: No Food Insecurity (04/25/2021)   Received from [facility], [facility]   Hunger Vital Sign    Worried About Running Out of Food in the Last Year: Never true    Ran Out of Food in the Last Year: Never true  Transportation Needs: No Transportation Needs (04/25/2021)   Received from [facility], [facility]   [facility] Transportation    Lack of Transportation (Medical): No    Lack of  Transportation (Non-Medical): No  Physical Activity: Not on file  Stress: Not on file  Social Connections: Not on file  Intimate Partner Violence: Not on file    Allergies  Allergen Reactions   Acarbose Diarrhea    Current Outpatient Medications  Medication Sig Dispense Refill   amLODipine (NORVASC) 10 MG tablet Take 10 mg by mouth in the morning.     aspirin EC 81 MG tablet Take 1 tablet (81 mg total) by mouth daily with breakfast. 30 tablet 2   atorvastatin (LIPITOR) 40 MG tablet Take 1 tablet (40 mg total) by mouth daily at 6 PM. (Patient taking differently: Take 40 mg by mouth at bedtime.) 30 tablet 1   cholestyramine (QUESTRAN) 4 g packet Take 0.5 packets (2 g total) by mouth daily.     cilostazol (PLETAL) 100 MG tablet Take 100 mg by mouth 2 (two) times daily.     clopidogrel (PLAVIX) 75 MG tablet Take 75 mg by mouth in the morning.     empagliflozin (JARDIANCE) 25 MG TABS tablet Take 12.5 mg by mouth in the morning.     hydrochlorothiazide (HYDRODIURIL) 12.5 MG tablet Take 12.5 mg by mouth in the morning.     insulin glargine (LANTUS) 100 UNIT/ML injection Inject 20 Units into the skin at bedtime.     losartan (COZAAR) 50 MG tablet Take 50 mg by mouth daily.     metoprolol tartrate (LOPRESSOR) 50 MG tablet Take 25 mg by mouth 2 (two) times daily.      pantoprazole (PROTONIX) 40 MG tablet Take 1 tablet (40 mg total) by mouth daily. 90 tablet 3   pregabalin (LYRICA) 75 MG capsule Take 75 mg by mouth 2 (two) times daily.     vitamin B-12 (CYANOCOBALAMIN) 1000 MCG tablet Take 1,000 mcg by mouth in the morning.     No current facility-administered medications for this visit.   Facility-Administered Medications Ordered in Other Visits  Medication Dose Route Frequency Provider Last Rate Last Admin   iron sucrose (VENOFER) 200 mg in sodium chloride 0.9 % 100 mL IVPB  200 mg Intravenous Weekly Anthony Sar, MD   Stopped at 05/01/23 1236    REVIEW OF SYSTEMS:  [X]  denotes positive  finding, [ ]  denotes negative finding Cardiac  Comments:  Chest pain or chest pressure:    Shortness of breath upon exertion:    Short of breath when lying flat:    Irregular heart rhythm:        Vascular  Pain in calf, thigh, or hip brought on by ambulation:    Pain in feet at night that wakes you up from your sleep:     Blood clot in your veins:    Leg swelling:         Pulmonary    Oxygen at home:    Productive cough:     Wheezing:         Neurologic    Sudden weakness in arms or legs:     Sudden numbness in arms or legs:     Sudden onset of difficulty speaking or slurred speech:    Temporary loss of vision in one eye:     Problems with dizziness:         Gastrointestinal    Blood in stool:     Vomited blood:         Genitourinary    Burning when urinating:     Blood in urine:        Psychiatric    Major depression:         Hematologic    Bleeding problems:    Problems with blood clotting too easily:        Skin    Rashes or ulcers:        Constitutional    Fever or chills:      PHYSICAL EXAM: Vitals:   05/01/23 1352  BP: (!) 163/67  Pulse: 78  Resp: 18  Temp: 97.8 F (36.6 C)  TempSrc: Temporal  SpO2: 95%  Weight: 141 lb 6.4 oz (64.1 kg)  Height: 5\' 7"  (1.702 m)    GENERAL: The patient is a well-nourished male, in no acute distress. The vital signs are documented above. CARDIAC: There is a regular rate and rhythm.  VASCULAR:  Palpable femoral pulse bilaterally Palpable pulse in femorofemoral bypass No palpable pedal pulses, brisk lower extremity signals No lower extremity tissue loss PULMONARY: No respiratory distress. ABDOMEN: Soft and non-tender. MUSCULOSKELETAL: There are no major deformities or cyanosis. NEUROLOGIC: No focal weakness or paresthesias are detected. PSYCHIATRIC: The patient has a normal affect.  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  Unable to identify the SMA.   Unable to visualize right renal artery.  No significant left renal artery stenosis.  Assessment/Plan:  77 yo M that has been followed by our practice since a femorofemoral bypass with Dr. Madilyn Fireman years ago.  As recently as 08/10/2016 he had a redo right femoral artery exposure with iliofemoral endarterectomy bovine patch and redo right limb of his femorofemoral bypass by Dr. Myra Gianotti.  On 09/19/2016 he then had ultrasound-guided access of left brachial artery with angioplasty of the right common femoral artery, stent of the right external iliac, and stent of the right common femoral artery also by Dr. Myra Gianotti.  I then performed right common iliac artery angioplasty and stenting with a 7 x 29 VBX as well as a drug-coated balloon angioplasty of the right external iliac artery on 09/22/21 for a recurrent high grade stenosis.  He is now here for evaluation of chronic mesenteric ischemia.  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  The SMA was not visualized.  I discussed that some of his symptoms are consistent with mesenteric ischemia including the weight loss however he denies any postprandial abdominal pain or food fear that would also correlate.  Sounds like he has severe reflux with nausea/vomiting.  I think it would be best  to wait on any invasive intervention until after his EGD and colonoscopy on October 2 to ensure there are no other findings to explain his symptoms.  I will have him follow-up with me in 1 month.  If we pursue angiography to look at his mesenteric arteries we can also look at his renal arteries at the same time.  No renal artery stenosis was identified although difficult to evaluate evaluate the right renal artery.  Slow persistent decline in renal function.  Cephus Shelling, MD [facility] Office: [phone]

## 2023-05-08 ENCOUNTER — Encounter (HOSPITAL_COMMUNITY)
Admission: RE | Admit: 2023-05-08 | Discharge: 2023-05-08 | Disposition: A | Discharge status: Home / Self Care | Payer: No Typology Code available for payment source | Source: Ambulatory Visit | Attending: Nephrology | Admitting: Nephrology

## 2023-05-08 DIAGNOSIS — N189 Chronic kidney disease, unspecified: Secondary | ICD-10-CM | POA: Diagnosis not present

## 2023-05-08 MED ORDER — SODIUM CHLORIDE 0.9 % IV SOLN
200.0000 mg | INTRAVENOUS | Status: DC
  Administered 2023-05-08: 200 mg via INTRAVENOUS
  Filled 2023-05-08: qty 200

## 2023-05-14 ENCOUNTER — Encounter (HOSPITAL_COMMUNITY)
Admission: RE | Admit: 2023-05-14 | Discharge: 2023-05-14 | Disposition: A | Discharge status: Home / Self Care | Payer: No Typology Code available for payment source | Source: Ambulatory Visit | Attending: Internal Medicine | Admitting: Internal Medicine

## 2023-05-14 ENCOUNTER — Other Ambulatory Visit: Payer: Self-pay

## 2023-05-14 ENCOUNTER — Encounter (HOSPITAL_COMMUNITY): Payer: Self-pay

## 2023-05-14 HISTORY — DX: Anemia, unspecified: D64.9

## 2023-05-14 NOTE — Patient Instructions (Signed)
[name]  05/14/2023     @PREFPERIOPPHARMACY @   Your procedure is scheduled on 05/16/23.  Report to [facility] at 0600 A.M.  Call this number if you have problems the morning of surgery:  [phone]  If you experience any cold or flu symptoms such as cough, fever, chills, shortness of breath, etc. between now and your scheduled surgery, please notify us at the above number.   Remember:  Do not eat or drink after midnight.    Take these medicines the morning of surgery with A SIP OF WATER norvasc, protonix & lyrica.  TUESDAY NIGHT TAKE 10 UNITS OF LANTUS. DO NOT TAKE DIABETIC MEDICINE THE MORNING OF PROCEDURE.  JARDIANCE LAST DOSE 05/12/23 CHOLESYTRAMINE LAST DOSE 05/11/23  FOLLOW DIET & PREP INSTRUCTIONS AS PROVIDED BY DOCTOR'S OFFICE.    Do not wear jewelry, make-up or nail polish, including gel polish,  artificial nails, or any other type of covering on natural nails (fingers and  toes).  Do not wear lotions, powders, or perfumes, or deodorant.  Do not shave 48 hours prior to surgery.  Men may shave face and neck.  Do not bring valuables to the hospital.  [facility] is not responsible for any belongings or valuables.  Contacts, dentures or bridgework may not be worn into surgery.  Leave your suitcase in the car.  After surgery it may be brought to your room.  For patients admitted to the hospital, discharge time will be determined by your treatment team.  Patients discharged the day of surgery will not be allowed to drive home.   Name and phone number of your driver:   WIFE Special instructions:  FOLLOW DIET & PREP INSTRUCTIONS  Please read over the following fact sheets that you were given. Anesthesia Post-op Instructions and Care and Recovery After Surgery      PATIENT INSTRUCTIONS POST-ANESTHESIA  IMMEDIATELY FOLLOWING SURGERY:  Do not drive or operate machinery for the first twenty four hours after surgery.  Do not make any important decisions for  twenty four hours after surgery or while taking narcotic pain medications or sedatives.  If you develop intractable nausea and vomiting or a severe headache please notify your doctor immediately.  FOLLOW-UP:  Please make an appointment with your surgeon as instructed. You do not need to follow up with anesthesia unless specifically instructed to do so.  WOUND CARE INSTRUCTIONS (if applicable):  Keep a dry clean dressing on the anesthesia/puncture wound site if there is drainage.  Once the wound has quit draining you may leave it open to air.  Generally you should leave the bandage intact for twenty four hours unless there is drainage.  If the epidural site drains for more than 36-48 hours please call the anesthesia department.  QUESTIONS?:  Please feel free to call your physician or the hospital operator if you have any questions, and they will be happy to assist you.      Esophageal Dilatation Esophageal dilatation, also called esophageal dilation, is a procedure to widen or open a blocked or narrowed part of the esophagus. The esophagus is the part of the body that moves food and liquid from the mouth to the stomach. You may need this procedure if: You have a buildup of scar tissue in your esophagus that makes it difficult, painful, or impossible to swallow. This can be caused by gastroesophageal reflux disease (GERD). You have cancer of the esophagus. There is a problem with how food moves through your esophagus. In  some cases, you may need this procedure repeated at a later time to dilate the esophagus gradually. Tell a health care provider about: Any allergies you have. All medicines you are taking, including vitamins, herbs, eye drops, creams, and over-the-counter medicines. Any problems you or family members have had with anesthetic medicines. Any blood disorders you have. Any surgeries you have had. Any medical conditions you have. Any antibiotic medicines you are required to take  before dental procedures. Whether you are pregnant or may be pregnant. What are the risks? Generally, this is a safe procedure. However, problems may occur, including: Bleeding due to a tear in the lining of the esophagus. A hole, or perforation, in the esophagus. What happens before the procedure? Ask your health care provider about: Changing or stopping your regular medicines. This is especially important if you are taking diabetes medicines or blood thinners. Taking medicines such as aspirin and ibuprofen. These medicines can thin your blood. Do not take these medicines unless your health care provider tells you to take them. Taking over-the-counter medicines, vitamins, herbs, and supplements. Follow instructions from your health care provider about eating or drinking restrictions. Plan to have a responsible adult take you home from the hospital or clinic. Plan to have a responsible adult care for you for the time you are told after you leave the hospital or clinic. This is important. What happens during the procedure? You may be given a medicine to help you relax (sedative). A numbing medicine may be sprayed into the back of your throat, or you may gargle the medicine. Your health care provider may perform the dilatation using various surgical instruments, such as: Simple dilators. This instrument is carefully placed in the esophagus to stretch it. Guided wire bougies. This involves using an endoscope to insert a wire into the esophagus. A dilator is passed over this wire to enlarge the esophagus. Then the wire is removed. Balloon dilators. An endoscope with a small balloon is inserted into the esophagus. The balloon is inflated to stretch the esophagus and open it up. The procedure may vary among health care providers and hospitals. What can I expect after the procedure? Your blood pressure, heart rate, breathing rate, and blood oxygen level will be monitored until you leave the hospital  or clinic. Your throat may feel slightly sore and numb. This will get better over time. You will not be allowed to eat or drink until your throat is no longer numb. When you are able to drink, urinate, and sit on the edge of the bed without nausea or dizziness, you may be able to return home. Follow these instructions at home: Take over-the-counter and prescription medicines only as told by your health care provider. If you were given a sedative during the procedure, it can affect you for several hours. Do not drive or operate machinery until your health care provider says that it is safe. Plan to have a responsible adult care for you for the time you are told. This is important. Follow instructions from your health care provider about any eating or drinking restrictions. Do not use any products that contain nicotine or tobacco, such as cigarettes, e-cigarettes, and chewing tobacco. If you need help quitting, ask your health care provider. Keep all follow-up visits. This is important. Contact a health care provider if: You have a fever. You have pain that is not relieved by medicine. Get help right away if: You have chest pain. You have trouble breathing. You have trouble swallowing. You  vomit blood. You have black, tarry, or bloody stools. These symptoms may represent a serious problem that is an emergency. Do not wait to see if the symptoms will go away. Get medical help right away. Call your local emergency services (911 in the [place] Do not drive yourself to the hospital. Summary Esophageal dilatation, also called esophageal dilation, is a procedure to widen or open a blocked or narrowed part of the esophagus. Plan to have a responsible adult take you home from the hospital or clinic. For this procedure, a numbing medicine may be sprayed into the back of your throat, or you may gargle the medicine. Do not drive or operate machinery until your health care provider says that it is  safe. This information is not intended to replace advice given to you by your health care provider. Make sure you discuss any questions you have with your health care provider. Document Revised: 12/17/2019 Document Reviewed: 12/17/2019 Elsevier Patient Education  2024 [employer] Upper Endoscopy, Adult Upper endoscopy is a procedure to look inside the upper GI (gastrointestinal) tract. The upper GI tract is made up of: The esophagus. This is the part of the body that moves food from your mouth to your stomach. The stomach. The duodenum. This is the first part of your small intestine. This procedure is also called esophagogastroduodenoscopy (EGD) or gastroscopy. In this procedure, your health care provider passes a thin, flexible tube (endoscope) through your mouth and down your esophagus into your stomach and into your duodenum. A small camera is attached to the end of the tube. Images from the camera appear on a monitor in the exam room. During this procedure, your health care provider may also remove a small piece of tissue to be sent to a lab and examined under a microscope (biopsy). Your health care provider may do an upper endoscopy to diagnose cancers of the upper GI tract. You may also have this procedure to find the cause of other conditions, such as: Stomach pain. Heartburn. Pain or problems when swallowing. Nausea and vomiting. Stomach bleeding. Stomach ulcers. Tell a health care provider about: Any allergies you have. All medicines you are taking, including vitamins, herbs, eye drops, creams, and over-the-counter medicines. Any problems you or family members have had with anesthetic medicines. Any bleeding problems you have. Any surgeries you have had. Any medical conditions you have. Whether you are pregnant or may be pregnant. What are the risks? Your healthcare provider will talk with you about risks. These may include: Infection. Bleeding. Allergic reactions to  medicines. A tear or hole (perforation) in the esophagus, stomach, or duodenum. What happens before the procedure? When to stop eating and drinking Follow instructions from your health care provider about what you may eat and drink. These may include: 8 hours before your procedure Stop eating most foods. Do not eat meat, fried foods, or fatty foods. Eat only light foods, such as toast or crackers. All liquids are okay except energy drinks and alcohol. 6 hours before your procedure Stop eating. Drink only clear liquids, such as water, clear fruit juice, black coffee, plain tea, and sports drinks. Do not drink energy drinks or alcohol. 2 hours before your procedure Stop drinking all liquids. You may be allowed to take medicines with small sips of water. If you do not follow your health care provider's instructions, your procedure may be delayed or canceled. Medicines Ask your health care provider about: Changing or stopping your regular medicines. This is especially important if you  are taking diabetes medicines or blood thinners. Taking medicines such as aspirin and ibuprofen. These medicines can thin your blood. Do not take these medicines unless your health care provider tells you to take them. Taking over-the-counter medicines, vitamins, herbs, and supplements. General instructions If you will be going home right after the procedure, plan to have a responsible adult: Take you home from the hospital or clinic. You will not be allowed to drive. Care for you for the time you are told. What happens during the procedure?  An IV will be inserted into one of your veins. You may be given one or more of the following: A medicine to help you relax (sedative). A medicine to numb the throat (local anesthetic). You will lie on your left side on an exam table. Your health care provider will pass the endoscope through your mouth and down your esophagus. Your health care provider will use the  scope to check the inside of your esophagus, stomach, and duodenum. Biopsies may be taken. The endoscope will be removed. The procedure may vary among health care providers and hospitals. What happens after the procedure? Your blood pressure, heart rate, breathing rate, and blood oxygen level will be monitored until you leave the hospital or clinic. When your throat is no longer numb, you may be given some fluids to drink. If you were given a sedative during the procedure, it can affect you for several hours. Do not drive or operate machinery until your health care provider says that it is safe. It is up to you to get the results of your procedure. Ask your health care provider, or the department that is doing the procedure, when your results will be ready. Contact a health care provider if you: Have a sore throat that lasts longer than 1 day. Have a fever. Get help right away if you: Vomit blood or your vomit looks like coffee grounds. Have bloody, black, or tarry stools. Have a very bad sore throat or you cannot swallow. Have difficulty breathing or very bad pain in your chest or abdomen. These symptoms may be an emergency. Get help right away. Call 911. Do not wait to see if the symptoms will go away. Do not drive yourself to the hospital. Summary Upper endoscopy is a procedure to look inside the upper GI tract. During the procedure, an IV will be inserted into one of your veins. You may be given a medicine to help you relax. The endoscope will be passed through your mouth and down your esophagus. Follow instructions from your health care provider about what you can eat and drink. This information is not intended to replace advice given to you by your health care provider. Make sure you discuss any questions you have with your health care provider. Document Revised: 11/09/2021 Document Reviewed: 11/09/2021 Elsevier Patient Education  2024 [employer] Upper Endoscopy, Adult, Care  After After the procedure, it is common to have a sore throat. It is also common to have: Mild stomach pain or discomfort. Bloating. Nausea. Follow these instructions at home: The instructions below may help you care for yourself at home. Your health care provider may give you more instructions. If you have questions, ask your health care provider. If you were given a sedative during the procedure, it can affect you for several hours. Do not drive or operate machinery until your health care provider says that it is safe. If you will be going home right after the procedure, plan to have a  responsible adult: Take you home from the hospital or clinic. You will not be allowed to drive. Care for you for the time you are told. Follow instructions from your health care provider about what you may eat and drink. Return to your normal activities as told by your health care provider. Ask your health care provider what activities are safe for you. Take over-the-counter and prescription medicines only as told by your health care provider. Contact a health care provider if you: Have a sore throat that lasts longer than one day. Have trouble swallowing. Have a fever. Get help right away if you: Vomit blood or your vomit looks like coffee grounds. Have bloody, black, or tarry stools. Have a very bad sore throat or you cannot swallow. Have difficulty breathing or very bad pain in your chest or abdomen. These symptoms may be an emergency. Get help right away. Call 911. Do not wait to see if the symptoms will go away. Do not drive yourself to the hospital. Summary After the procedure, it is common to have a sore throat, mild stomach discomfort, bloating, and nausea. If you were given a sedative during the procedure, it can affect you for several hours. Do not drive until your health care provider says that it is safe. Follow instructions from your health care provider about what you may eat and  drink. Return to your normal activities as told by your health care provider. This information is not intended to replace advice given to you by your health care provider. Make sure you discuss any questions you have with your health care provider. Document Revised: 11/09/2021 Document Reviewed: 11/09/2021 Elsevier Patient Education  2024 [employer] Colonoscopy, Adult A colonoscopy is a procedure to look at the entire large intestine. This procedure is done using a long, thin, flexible tube that has a camera on the end. You may have a colonoscopy: As a part of normal colorectal screening. If you have certain symptoms, such as: A low number of red blood cells in your blood (anemia). Diarrhea that does not go away. Pain in your abdomen. Blood in your stool. A colonoscopy can help screen for and diagnose medical problems, including: An abnormal growth of cells or tissue (tumor). Abnormal growths within the lining of your intestine (polyps). Inflammation. Areas of bleeding. Tell your health care provider about: Any allergies you have. All medicines you are taking, including vitamins, herbs, eye drops, creams, and over-the-counter medicines. Any problems you or family members have had with anesthetic medicines. Any bleeding problems you have. Any surgeries you have had. Any medical conditions you have. Any problems you have had with having bowel movements. Whether you are pregnant or may be pregnant. What are the risks? Generally, this is a safe procedure. However, problems may occur, including: Bleeding. Damage to your intestine. Allergic reactions to medicines given during the procedure. Infection. This is rare. What happens before the procedure? Eating and drinking restrictions Follow instructions from your health care provider about eating or drinking restrictions, which may include: A few days before the procedure: Follow a low-fiber diet. Avoid nuts, seeds, dried fruit,  raw fruits, and vegetables. 1-3 days before the procedure: Eat only gelatin dessert or ice pops. Drink only clear liquids, such as water, clear juice, clear broth or bouillon, black coffee or tea, or clear soft drinks or sports drinks. Avoid liquids that contain red or purple dye. The day of the procedure: Do not eat solid foods. You may continue to drink clear liquids until  up to 2 hours before the procedure. Do not eat or drink anything starting 2 hours before the procedure, or within the time period that your health care provider recommends. Bowel prep If you were prescribed a bowel prep to take by mouth (orally) to clean out your colon: Take it as told by your health care provider. Starting the day before your procedure, you will need to drink a large amount of liquid medicine. The liquid will cause you to have many bowel movements of loose stool until your stool becomes almost clear or light green. If your skin or the opening between the buttocks (anus) gets irritated from diarrhea, you may relieve the irritation using: Wipes with medicine in them, such as adult wet wipes with aloe and vitamin E. A product to soothe skin, such as petroleum jelly. If you vomit while drinking the bowel prep: Take a break for up to 60 minutes. Begin the bowel prep again. Call your health care provider if you keep vomiting or you cannot take the bowel prep without vomiting. To clean out your colon, you may also be given: Laxative medicines. These help you have a bowel movement. Instructions for enema use. An enema is liquid medicine injected into your rectum. Medicines Ask your health care provider about: Changing or stopping your regular medicines or supplements. This is especially important if you are taking iron supplements, diabetes medicines, or blood thinners. Taking medicines such as aspirin and ibuprofen. These medicines can thin your blood. Do not take these medicines unless your health care  provider tells you to take them. Taking over-the-counter medicines, vitamins, herbs, and supplements. General instructions Ask your health care provider what steps will be taken to help prevent infection. These may include washing skin with a germ-killing soap. If you will be going home right after the procedure, plan to have a responsible adult: Take you home from the hospital or clinic. You will not be allowed to drive. Care for you for the time you are told. What happens during the procedure?  An IV will be inserted into one of your veins. You will be given a medicine to make you fall asleep (general anesthetic). You will lie on your side with your knees bent. A lubricant will be put on the tube. Then the tube will be: Inserted into your anus. Gently eased through all parts of your large intestine. Air will be sent into your colon to keep it open. This may cause some pressure or cramping. Images will be taken with the camera and will appear on a screen. A small tissue sample may be removed to be looked at under a microscope (biopsy). The tissue may be sent to a lab for testing if any signs of problems are found. If small polyps are found, they may be removed and checked for cancer cells. When the procedure is finished, the tube will be removed. The procedure may vary among health care providers and hospitals. What happens after the procedure? Your blood pressure, heart rate, breathing rate, and blood oxygen level will be monitored until you leave the hospital or clinic. You may have a small amount of blood in your stool. You may pass gas and have mild cramping or bloating in your abdomen. This is caused by the air that was used to open your colon during the exam. If you were given a sedative during the procedure, it can affect you for several hours. Do not drive or operate machinery until your health care provider says that  it is safe. It is up to you to get the results of your  procedure. Ask your health care provider, or the department that is doing the procedure, when your results will be ready. Summary A colonoscopy is a procedure to look at the entire large intestine. Follow instructions from your health care provider about eating and drinking before the procedure. If you were prescribed an oral bowel prep to clean out your colon, take it as told by your health care provider. During the colonoscopy, a flexible tube with a camera on its end is inserted into the anus and then passed into all parts of the large intestine. This information is not intended to replace advice given to you by your health care provider. Make sure you discuss any questions you have with your health care provider. Document Revised: 09/12/2022 Document Reviewed: 03/23/2021 Elsevier Patient Education  2024 [employer] Colonoscopy, Adult, Care After The following information offers guidance on how to care for yourself after your procedure. Your health care provider may also give you more specific instructions. If you have problems or questions, contact your health care provider. What can I expect after the procedure? After the procedure, it is common to have: A small amount of blood in your stool for 24 hours after the procedure. Some gas. Mild cramping or bloating of your abdomen. Follow these instructions at home: Eating and drinking  Drink enough fluid to keep your urine pale yellow. Follow instructions from your health care provider about eating or drinking restrictions. Resume your normal diet as told by your health care provider. Avoid heavy or fried foods that are hard to digest. Activity Rest as told by your health care provider. Avoid sitting for a long time without moving. Get up to take short walks every 1-2 hours. This is important to improve blood flow and breathing. Ask for help if you feel weak or unsteady. Return to your normal activities as told by your health care  provider. Ask your health care provider what activities are safe for you. Managing cramping and bloating  Try walking around when you have cramps or feel bloated. If directed, apply heat to your abdomen as told by your health care provider. Use the heat source that your health care provider recommends, such as a moist heat pack or a heating pad. Place a towel between your skin and the heat source. Leave the heat on for 20-30 minutes. Remove the heat if your skin turns bright red. This is especially important if you are unable to feel pain, heat, or cold. You have a greater risk of getting burned. General instructions If you were given a sedative during the procedure, it can affect you for several hours. Do not drive or operate machinery until your health care provider says that it is safe. For the first 24 hours after the procedure: Do not sign important documents. Do not drink alcohol. Do your regular daily activities at a slower pace than normal. Eat soft foods that are easy to digest. Take over-the-counter and prescription medicines only as told by your health care provider. Keep all follow-up visits. This is important. Contact a health care provider if: You have blood in your stool 2-3 days after the procedure. Get help right away if: You have more than a small spotting of blood in your stool. You have large blood clots in your stool. You have swelling of your abdomen. You have nausea or vomiting. You have a fever. You have increasing pain in your  abdomen that is not relieved with medicine. These symptoms may be an emergency. Get help right away. Call 911. Do not wait to see if the symptoms will go away. Do not drive yourself to the hospital. Summary After the procedure, it is common to have a small amount of blood in your stool. You may also have mild cramping and bloating of your abdomen. If you were given a sedative during the procedure, it can affect you for several hours. Do  not drive or operate machinery until your health care provider says that it is safe. Get help right away if you have a lot of blood in your stool, nausea or vomiting, a fever, or increased pain in your abdomen. This information is not intended to replace advice given to you by your health care provider. Make sure you discuss any questions you have with your health care provider. Document Revised: 09/12/2022 Document Reviewed: 03/23/2021 Elsevier Patient Education  2024 [employer].

## 2023-05-15 ENCOUNTER — Encounter (HOSPITAL_COMMUNITY)
Admission: RE | Admit: 2023-05-15 | Discharge: 2023-05-15 | Disposition: A | Discharge status: Home / Self Care | Payer: No Typology Code available for payment source | Source: Ambulatory Visit | Attending: Nephrology | Admitting: Nephrology

## 2023-05-15 DIAGNOSIS — N189 Chronic kidney disease, unspecified: Secondary | ICD-10-CM | POA: Insufficient documentation

## 2023-05-15 DIAGNOSIS — D631 Anemia in chronic kidney disease: Secondary | ICD-10-CM | POA: Diagnosis not present

## 2023-05-15 MED ORDER — SODIUM CHLORIDE 0.9 % IV SOLN
200.0000 mg | INTRAVENOUS | Status: DC
  Administered 2023-05-15: 200 mg via INTRAVENOUS
  Filled 2023-05-15: qty 200

## 2023-05-15 NOTE — Anesthesia Preprocedure Evaluation (Signed)
Anesthesia Evaluation  Patient identified by MRN, date of birth, ID band Patient awake    Reviewed: Allergy & Precautions, NPO status , Patient's Chart, lab work & pertinent test results  Airway Mallampati: II  TM Distance: >3 FB Neck ROM: Full    Dental  (+) Poor Dentition, Edentulous Upper, Dental Advisory Given   Pulmonary former smoker Stop Bang 3          Cardiovascular hypertension, Pt. on medications + CAD, + Cardiac Stents, + CABG (x5, 2017) and + Peripheral Vascular Disease (s/p fem-pop bypass)   Rhythm:Regular Rate:Normal  On plavix    Neuro/Psych Diabetic neuropathy  Neuromuscular disease  negative psych ROS   GI/Hepatic Neg liver ROS,GERD  Medicated,,Diverticulitis s/p colectomy   Endo/Other  diabetes, Type 2, Insulin Dependent, Oral Hypoglycemic Agents    Renal/GU CRFRenal diseaseStage 3 CKD  negative genitourinary   Musculoskeletal Left hip fx s/p fall   Abdominal  (+)  Abdomen: soft. Bowel sounds: normal.  Peds  Hematology negative hematology ROS (+)   Anesthesia Other Findings   Reproductive/Obstetrics                             Anesthesia Physical Anesthesia Plan  ASA: 3  Anesthesia Plan: General   Post-op Pain Management: Minimal or no pain anticipated   Induction: Intravenous  PONV Risk Score and Plan:   Airway Management Planned: Nasal Cannula and Natural Airway  Additional Equipment: None  Intra-op Plan:   Post-operative Plan:   Informed Consent: I have reviewed the patients History and Physical, chart, labs and discussed the procedure including the risks, benefits and alternatives for the proposed anesthesia with the patient or authorized representative who has indicated his/her understanding and acceptance.     Dental advisory given  Plan Discussed with: CRNA  Anesthesia Plan Comments: (Lab Results      Component                Value                Date                      WBC                      12.6 (H)            04/03/2021                HGB                      14.1                04/03/2021                HCT                      42.0                04/03/2021                MCV                      88.8                04/03/2021                PLT  277                 04/03/2021           Lab Results      Component                Value               Date                      NA                       133 (L)             04/03/2021                K                        3.7                 04/03/2021                CO2                      24                  04/03/2021                GLUCOSE                  149 (H)             04/03/2021                BUN                      19                  04/03/2021                CREATININE               1.29 (H)            04/03/2021                CALCIUM                  9.1                 04/03/2021                GFRNONAA                 59 (L)              04/03/2021                GFRAA                    >60                 05/22/2019           Echocardiogram 03/27/2018: Study Conclusions  - Left ventricle: The cavity size was normal. There was mild focal  basal hypertrophy of the septum. Systolic function was normal.  The estimated ejection fraction was in the range of 55% to 60%.  Wall motion was normal; there were no regional wall motion  abnormalities. Left ventricular diastolic function parameters  were normal.  - Right ventricle: The RV is not well visualized but appears  dilated with reduced RVF.  - Pulmonary arteries: Systolic pressure could not be accurately  estimated. )        Anesthesia Quick Evaluation

## 2023-05-16 ENCOUNTER — Telehealth: Payer: Self-pay

## 2023-05-16 ENCOUNTER — Ambulatory Visit (HOSPITAL_COMMUNITY): Payer: No Typology Code available for payment source | Admitting: Anesthesiology

## 2023-05-16 ENCOUNTER — Encounter (HOSPITAL_COMMUNITY)
Admission: RE | Disposition: A | Discharge status: Home / Self Care | Payer: Self-pay | Source: Home / Self Care | Attending: Internal Medicine

## 2023-05-16 ENCOUNTER — Ambulatory Visit (HOSPITAL_COMMUNITY)
Admission: RE | Admit: 2023-05-16 | Discharge: 2023-05-16 | Disposition: A | Discharge status: Home / Self Care | Payer: No Typology Code available for payment source | Attending: Internal Medicine | Admitting: Internal Medicine

## 2023-05-16 DIAGNOSIS — E1151 Type 2 diabetes mellitus with diabetic peripheral angiopathy without gangrene: Secondary | ICD-10-CM | POA: Diagnosis not present

## 2023-05-16 DIAGNOSIS — R1310 Dysphagia, unspecified: Secondary | ICD-10-CM | POA: Diagnosis not present

## 2023-05-16 DIAGNOSIS — I129 Hypertensive chronic kidney disease with stage 1 through stage 4 chronic kidney disease, or unspecified chronic kidney disease: Secondary | ICD-10-CM | POA: Diagnosis not present

## 2023-05-16 DIAGNOSIS — Z1211 Encounter for screening for malignant neoplasm of colon: Secondary | ICD-10-CM | POA: Insufficient documentation

## 2023-05-16 DIAGNOSIS — R197 Diarrhea, unspecified: Secondary | ICD-10-CM | POA: Diagnosis not present

## 2023-05-16 DIAGNOSIS — G709 Myoneural disorder, unspecified: Secondary | ICD-10-CM | POA: Diagnosis not present

## 2023-05-16 DIAGNOSIS — D509 Iron deficiency anemia, unspecified: Secondary | ICD-10-CM | POA: Diagnosis not present

## 2023-05-16 DIAGNOSIS — R109 Unspecified abdominal pain: Secondary | ICD-10-CM

## 2023-05-16 DIAGNOSIS — Z955 Presence of coronary angioplasty implant and graft: Secondary | ICD-10-CM | POA: Diagnosis not present

## 2023-05-16 DIAGNOSIS — I2510 Atherosclerotic heart disease of native coronary artery without angina pectoris: Secondary | ICD-10-CM

## 2023-05-16 DIAGNOSIS — Z860101 Personal history of adenomatous and serrated colon polyps: Secondary | ICD-10-CM

## 2023-05-16 DIAGNOSIS — K449 Diaphragmatic hernia without obstruction or gangrene: Secondary | ICD-10-CM | POA: Diagnosis not present

## 2023-05-16 DIAGNOSIS — E1122 Type 2 diabetes mellitus with diabetic chronic kidney disease: Secondary | ICD-10-CM | POA: Diagnosis not present

## 2023-05-16 DIAGNOSIS — R634 Abnormal weight loss: Secondary | ICD-10-CM | POA: Diagnosis not present

## 2023-05-16 DIAGNOSIS — Z951 Presence of aortocoronary bypass graft: Secondary | ICD-10-CM | POA: Insufficient documentation

## 2023-05-16 DIAGNOSIS — K2100 Gastro-esophageal reflux disease with esophagitis, without bleeding: Secondary | ICD-10-CM | POA: Insufficient documentation

## 2023-05-16 DIAGNOSIS — Z87891 Personal history of nicotine dependence: Secondary | ICD-10-CM | POA: Insufficient documentation

## 2023-05-16 DIAGNOSIS — N183 Chronic kidney disease, stage 3 unspecified: Secondary | ICD-10-CM | POA: Diagnosis not present

## 2023-05-16 DIAGNOSIS — K529 Noninfective gastroenteritis and colitis, unspecified: Secondary | ICD-10-CM | POA: Diagnosis not present

## 2023-05-16 DIAGNOSIS — Z7984 Long term (current) use of oral hypoglycemic drugs: Secondary | ICD-10-CM | POA: Diagnosis not present

## 2023-05-16 DIAGNOSIS — Z95820 Peripheral vascular angioplasty status with implants and grafts: Secondary | ICD-10-CM | POA: Diagnosis not present

## 2023-05-16 DIAGNOSIS — Z9049 Acquired absence of other specified parts of digestive tract: Secondary | ICD-10-CM | POA: Insufficient documentation

## 2023-05-16 DIAGNOSIS — E1140 Type 2 diabetes mellitus with diabetic neuropathy, unspecified: Secondary | ICD-10-CM

## 2023-05-16 DIAGNOSIS — Z860100 Personal history of colon polyps, unspecified: Secondary | ICD-10-CM | POA: Diagnosis not present

## 2023-05-16 DIAGNOSIS — K219 Gastro-esophageal reflux disease without esophagitis: Secondary | ICD-10-CM | POA: Insufficient documentation

## 2023-05-16 HISTORY — PX: MALONEY DILATION: SHX5535

## 2023-05-16 HISTORY — PX: COLONOSCOPY WITH PROPOFOL: SHX5780

## 2023-05-16 HISTORY — PX: BIOPSY: SHX5522

## 2023-05-16 HISTORY — PX: ESOPHAGOGASTRODUODENOSCOPY (EGD) WITH PROPOFOL: SHX5813

## 2023-05-16 LAB — GLUCOSE, CAPILLARY: Glucose-Capillary: 84 mg/dL (ref 70–99)

## 2023-05-16 SURGERY — COLONOSCOPY WITH PROPOFOL
Anesthesia: General

## 2023-05-16 MED ORDER — LACTATED RINGERS IV SOLN: INTRAVENOUS | Status: DC

## 2023-05-16 MED ORDER — LIDOCAINE HCL (PF) 2 % IJ SOLN
INTRAMUSCULAR | Status: AC
  Filled 2023-05-16: qty 5

## 2023-05-16 MED ORDER — PROPOFOL 10 MG/ML IV BOLUS
INTRAVENOUS | Status: DC | PRN
Start: 2023-05-16 — End: 2023-05-16
  Administered 2023-05-16 (×2): 75 mg via INTRAVENOUS

## 2023-05-16 MED ORDER — PROPOFOL 500 MG/50ML IV EMUL
INTRAVENOUS | Status: AC
  Filled 2023-05-16: qty 50

## 2023-05-16 MED ORDER — LIDOCAINE 2% (20 MG/ML) 5 ML SYRINGE
INTRAMUSCULAR | Status: DC | PRN
  Administered 2023-05-16: 100 mg via INTRAVENOUS

## 2023-05-16 MED ORDER — PROPOFOL 500 MG/50ML IV EMUL
INTRAVENOUS | Status: DC | PRN
  Administered 2023-05-16: 100 ug/kg/min via INTRAVENOUS

## 2023-05-16 MED ORDER — PANTOPRAZOLE SODIUM 40 MG PO TBEC: 40.0000 mg | DELAYED_RELEASE_TABLET | Freq: Two times a day (BID) | ORAL | 11 refills | Status: DC

## 2023-05-16 NOTE — Interval H&P Note (Signed)
History and Physical Interval Note:  05/16/2023 7:25 AM  Imagene Gurney  has presented today for surgery, with the diagnosis of GERD, dysphagia, weight loss, abdominal pain, diarrhea, microcytic anemia.  The various methods of treatment have been discussed with the patient and family. After consideration of risks, benefits and other options for treatment, the patient has consented to  Procedure(s) with comments: COLONOSCOPY WITH PROPOFOL (N/A) - 730am, asa 3 ESOPHAGOGASTRODUODENOSCOPY (EGD) WITH PROPOFOL (N/A) MALONEY DILATION (N/A) as a surgical intervention.  The patient's history has been reviewed, patient examined, no change in status, stable for surgery.  I have reviewed the patient's chart and labs.  Questions were answered to the patient's satisfaction.     Shekita Boyden  No change.  EGD with esophageal dilation is feasible/appropriate surveillance colonoscopy with possible segmental biopsies. The risks, benefits, limitations, imponderables and alternatives regarding both EGD and colonoscopy have been reviewed with the patient. Questions have been answered. All parties agreeable.

## 2023-05-16 NOTE — Op Note (Addendum)
[facility] Patient Name: [name] Procedure Date: 05/16/2023 7:20 AM MRN: [id number] Date of Birth: [birth year] Attending MD: Gennette Pac , MD, [id number] CSN: [id number] Age: 77 Admit Type: Outpatient Procedure:                Colonoscopy Indications:              High risk colon cancer surveillance: Personal                            history of colonic polyps Providers:                Gennette Pac, MD, Edrick Kins, RN,                            Zena Amos Referring MD:              Medicines:                Propofol per Anesthesia Complications:            No immediate complications. Estimated Blood Loss:     Estimated blood loss was minimal. Procedure:                Pre-Anesthesia Assessment:                           - Prior to the procedure, a History and Physical                            was performed, and patient medications and                            allergies were reviewed. The patient's tolerance of                            previous anesthesia was also reviewed. The risks                            and benefits of the procedure and the sedation                            options and risks were discussed with the patient.                            All questions were answered, and informed consent                            was obtained. Prior Anticoagulants: The patient                            last took Plavix (clopidogrel) 4 days prior to the                            procedure. ASA Grade Assessment: III - A patient  with severe systemic disease. After reviewing the                            risks and benefits, the patient was deemed in                            satisfactory condition to undergo the procedure.                           After obtaining informed consent, the colonoscope                            was passed under direct vision. Throughout the                            procedure, the  patient's blood pressure, pulse, and                            oxygen saturations were monitored continuously. The                            [id number] scope was introduced through the                            anus and advanced to the the cecum, identified by                            appendiceal orifice and ileocecal valve. The                            colonoscopy was performed without difficulty. The                            patient tolerated the procedure well. The quality                            of the bowel preparation was adequate. The                            ileocecal valve, appendiceal orifice, and rectum                            were photographed. Scope In: 7:53:57 AM Scope Out: 8:10:14 AM Scope Withdrawal Time: 0 hours 7 minutes 25 seconds  Total Procedure Duration: 0 hours 16 minutes 17 seconds  Findings:      The perianal and digital rectal examinations were normal. Surgical       anastomosis identified approximate 25 cm from anal verge      The exam was otherwise without abnormality on direct and retroflexion       views. Segment of biopsies of the right and left colon taken to evaluate       chronic diarrhea Impression:               - Surgical anastomosis at 25 cm. The examination  was otherwise normal on direct and retroflexion                            views.                           -Status post segmental biopsy Moderate Sedation:      Moderate (conscious) sedation was personally administered by an       anesthesia professional. The following parameters were monitored: oxygen       saturation, heart rate, blood pressure, respiratory rate, EKG, adequacy       of pulmonary ventilation, and response to care. Recommendation:           - Patient has a contact number available for                            emergencies. The signs and symptoms of potential                            delayed complications were discussed  with the                            patient. Return to normal activities tomorrow.                            Written discharge instructions were provided to the                            patient.                           - Advance diet as tolerated.                           - Continue present medications.                           - Patient has a contact number available for                            emergencies. The signs and symptoms of potential                            delayed complications were discussed with the                            patient. Return to normal activities tomorrow.                            Written discharge instructions were provided to the                            patient.                           -Follow-up on pathology. Office visit with [place] in 6  weeks. See EGD report. No future surveillance                            colonoscopy recommended. Procedure Code(s):        --- Professional ---                           [phone], Colonoscopy, flexible; diagnostic, including                            collection of specimen(s) by brushing or washing,                            when performed (separate procedure) Diagnosis Code(s):        --- Professional ---                           Z86.010, Personal history of colonic polyps CPT copyright 2022 American [employer]. All rights reserved. The codes documented in this report are preliminary and upon coder review may  be revised to meet current compliance requirements. Gerrit Friends. Sybrina Laning, MD Gennette Pac, MD 05/16/2023 8:23:34 AM This report has been signed electronically. Number of Addenda: 0

## 2023-05-16 NOTE — Op Note (Signed)
[facility] Patient Name: [name] Procedure Date: 05/16/2023 7:19 AM MRN: [id number] Date of Birth: Mar 29, 1946 Attending MD: Gennette Pac , MD, [id number] CSN: [id number] Age: 77 Admit Type: Outpatient Procedure:                Upper GI endoscopy Indications:              Dysphagia, Heartburn Providers:                Gennette Pac, MD, Edrick Kins, RN,                            Zena Amos Referring MD:              Medicines:                Propofol per Anesthesia, Monitored Anesthesia Care Complications:            No immediate complications. Estimated Blood Loss:     Estimated blood loss was minimal. Procedure:                Pre-Anesthesia Assessment:                           - Prior to the procedure, a History and Physical                            was performed, and patient medications and                            allergies were reviewed. The patient's tolerance of                            previous anesthesia was also reviewed. The risks                            and benefits of the procedure and the sedation                            options and risks were discussed with the patient.                            All questions were answered, and informed consent                            was obtained. Prior Anticoagulants: The patient                            last took Plavix (clopidogrel) 4 days prior to the                            procedure. ASA Grade Assessment: III - A patient                            with severe systemic disease. After reviewing the  risks and benefits, the patient was deemed in                            satisfactory condition to undergo the procedure.                           After obtaining informed consent, the endoscope was                            passed under direct vision. Throughout the                            procedure, the patient's blood pressure, pulse, and                             oxygen saturations were monitored continuously. The                            [id number]-H190 ([id number]) scope was introduced through the                            mouth, and advanced to the second part of duodenum.                            The upper GI endoscopy was accomplished without                            difficulty. The patient tolerated the procedure                            well. Scope In: 7:42:13 AM Scope Out: 7:49:16 AM Total Procedure Duration: 0 hours 7 minutes 3 seconds  Findings:      Grade D esophagitis. Some denuding of the distal esophageal mucosa. Mild       narrowing. This segment. No fixed stricture or mass.      A small hiatal hernia was present. Normal-appearing gastric mucosa.       Patent pylorus.      The duodenal bulb and second portion of the duodenum were normal. Scope       was withdrawn. A 54 French Maloney dilator was passed to full insertion       with no resistance. A look back revealed persisting friability of the       distal esophageal mucosa. No apparent complication related to this       maneuver. Impression:               - Grade D reflux esophagitis. Some distal denuding                            of the esophageal mucosa status post Maloney                            dilation.                           - Small hiatal hernia.                           -  Normal duodenal bulb and second portion of the                            duodenum.                           - No specimens collected.                           -Patient takes his nighttime medications with a                            small amount of water and then immediately goes to                            bed. He may have an element of pill induced injury                            superimposed. Moderate Sedation:      Moderate (conscious) sedation was personally administered by an       anesthesia professional. The following parameters were monitored: oxygen        saturation, heart rate, blood pressure, respiratory rate, EKG, adequacy       of pulmonary ventilation, and response to care. Recommendation:           - Patient has a contact number available for                            emergencies. The signs and symptoms of potential                            delayed complications were discussed with the                            patient. Return to normal activities tomorrow.                            Written discharge instructions were provided to the                            patient.                           - Advance diet as tolerated. Increase Protonix to                            40 mg twice daily best taken 30 minutes before                            breakfast and supper. Swallowing precautions                            reviewed. Office visit with [place] in 6 weeks.                           -  See colonoscopy report Procedure Code(s):        --- Professional ---                           [phone], Esophagogastroduodenoscopy, flexible,                            transoral; diagnostic, including collection of                            specimen(s) by brushing or washing, when performed                            (separate procedure) Diagnosis Code(s):        --- Professional ---                           K44.9, Diaphragmatic hernia without obstruction or                            gangrene                           R13.10, Dysphagia, unspecified                           R12, Heartburn CPT copyright 2022 American Medical Association. All rights reserved. The codes documented in this report are preliminary and upon coder review may  be revised to meet current compliance requirements. Gerrit Friends. Lupe Bonner, MD Gennette Pac, MD 05/16/2023 8:19:12 AM This report has been signed electronically. Number of Addenda: 0

## 2023-05-16 NOTE — Anesthesia Postprocedure Evaluation (Signed)
Anesthesia Post Note  Patient: [name]  Procedure(s) Performed: COLONOSCOPY WITH PROPOFOL ESOPHAGOGASTRODUODENOSCOPY (EGD) WITH PROPOFOL MALONEY DILATION BIOPSY  Patient location during evaluation: PACU Anesthesia Type: General Level of consciousness: awake and alert Pain management: pain level controlled Vital Signs Assessment: post-procedure vital signs reviewed and stable Respiratory status: spontaneous breathing, nonlabored ventilation, respiratory function stable and patient connected to nasal cannula oxygen Cardiovascular status: blood pressure returned to baseline and stable Postop Assessment: no apparent nausea or vomiting Anesthetic complications: no   There were no known notable events for this encounter.   Last Vitals:  Vitals:   05/16/23 0655 05/16/23 0816  BP: (!) 188/75 (!) 115/55  Pulse: 93 82  Resp: 15 12  Temp: 36.6 C 36.4 C  SpO2:  100%    Last Pain:  Vitals:   05/16/23 0816  TempSrc: Oral  PainSc: 0-No pain                 Ranya Fiddler L Jamiyah Dingley

## 2023-05-16 NOTE — Telephone Encounter (Signed)
-----   Message from Eula Listen sent at 05/16/2023  8:32 AM EDT -----   Patient needs any prescription for Protonix 40 mg pill dispense 60 with 11 refills 1 twice daily before breakfast and supper.  Thanks

## 2023-05-16 NOTE — Transfer of Care (Signed)
Immediate Anesthesia Transfer of Care Note  Patient: [name]  Procedure(s) Performed: COLONOSCOPY WITH PROPOFOL ESOPHAGOGASTRODUODENOSCOPY (EGD) WITH PROPOFOL MALONEY DILATION BIOPSY  Patient Location: Endoscopy Unit  Anesthesia Type:General  Level of Consciousness: awake, alert , and oriented  Airway & Oxygen Therapy: Patient Spontanous Breathing and Patient connected to face mask oxygen  Post-op Assessment: Report given to RN and Post -op Vital signs reviewed and stable  Post vital signs: Reviewed and stable  Last Vitals:  Vitals Value Taken Time  BP    Temp    Pulse    Resp    SpO2      Last Pain:  Vitals:   05/16/23 0735  TempSrc:   PainSc: 0-No pain      Patients Stated Pain Goal: 5 (05/16/23 0655)  Complications: No notable events documented.

## 2023-05-16 NOTE — Discharge Instructions (Signed)
EGD Discharge instructions Please read the instructions outlined below and refer to this sheet in the next few weeks. These discharge instructions provide you with general information on caring for yourself after you leave the hospital. Your doctor may also give you specific instructions. While your treatment has been planned according to the most current medical practices available, unavoidable complications occasionally occur. If you have any problems or questions after discharge, please call your doctor. ACTIVITY You may resume your regular activity but move at a slower pace for the next 24 hours.  Take frequent rest periods for the next 24 hours.  Walking will help expel (get rid of) the air and reduce the bloated feeling in your abdomen.  No driving for 24 hours (because of the anesthesia (medicine) used during the test).  You may shower.  Do not sign any important legal documents or operate any machinery for 24 hours (because of the anesthesia used during the test).  NUTRITION Drink plenty of fluids.  You may resume your normal diet.  Begin with a light meal and progress to your normal diet.  Avoid alcoholic beverages for 24 hours or as instructed by your caregiver.  MEDICATIONS You may resume your normal medications unless your caregiver tells you otherwise.  WHAT YOU CAN EXPECT TODAY You may experience abdominal discomfort such as a feeling of fullness or "gas" pains.  FOLLOW-UP Your doctor will discuss the results of your test with you.  SEEK IMMEDIATE MEDICAL ATTENTION IF ANY OF THE FOLLOWING OCCUR: Excessive nausea (feeling sick to your stomach) and/or vomiting.  Severe abdominal pain and distention (swelling).  Trouble swallowing.  Temperature over 101 F (37.8 C).  Rectal bleeding or vomiting of blood.    Colonoscopy Discharge Instructions  Read the instructions outlined below and refer to this sheet in the next few weeks. These discharge instructions provide you with  general information on caring for yourself after you leave the hospital. Your doctor may also give you specific instructions. While your treatment has been planned according to the most current medical practices available, unavoidable complications occasionally occur. If you have any problems or questions after discharge, call Dr. Jena Gauss at [phone]. ACTIVITY You may resume your regular activity, but move at a slower pace for the next 24 hours.  Take frequent rest periods for the next 24 hours.  Walking will help get rid of the air and reduce the bloated feeling in your belly (abdomen).  No driving for 24 hours (because of the medicine (anesthesia) used during the test).   Do not sign any important legal documents or operate any machinery for 24 hours (because of the anesthesia used during the test).  NUTRITION Drink plenty of fluids.  You may resume your normal diet as instructed by your doctor.  Begin with a light meal and progress to your normal diet. Heavy or fried foods are harder to digest and may make you feel sick to your stomach (nauseated).  Avoid alcoholic beverages for 24 hours or as instructed.  MEDICATIONS You may resume your normal medications unless your doctor tells you otherwise.  WHAT YOU CAN EXPECT TODAY Some feelings of bloating in the abdomen.  Passage of more gas than usual.  Spotting of blood in your stool or on the toilet paper.  IF YOU HAD POLYPS REMOVED DURING THE COLONOSCOPY: No aspirin products for 7 days or as instructed.  No alcohol for 7 days or as instructed.  Eat a soft diet for the next 24 hours.  FINDING  OUT THE RESULTS OF YOUR TEST Not all test results are available during your visit. If your test results are not back during the visit, make an appointment with your caregiver to find out the results. Do not assume everything is normal if you have not heard from your caregiver or the medical facility. It is important for you to follow up on all of your test  results.  SEEK IMMEDIATE MEDICAL ATTENTION IF: You have more than a spotting of blood in your stool.  Your belly is swollen (abdominal distention).  You are nauseated or vomiting.  You have a temperature over 101.  You have abdominal pain or discomfort that is severe or gets worse throughout the day.     No polyps found today.  Surgical anastomosis looks good.  Biopsies taken to evaluate for chronic diarrhea.  I do not recommend a future colonoscopy unless new symptoms develop   esophagus severely inflamed.  In part due to reflux.  May be getting pill stuck in your esophagus when you take your evening medication and lie flat in the bed.  I recommend you stay for 15 minutes upright after taking her medications and drink a full glass of water to ensure that medication gets to your stomach.    I stretched your esophagus today.  We are increasing your Protonix to 40 mg twice daily.  New prescription already provided to the pharmacy from the office.  Office visit with Tana Coast in 6 weeks.    At patient request, I called Dois Davenport at [phone] -  reviewed findings and recommendations.

## 2023-05-17 LAB — SURGICAL PATHOLOGY

## 2023-05-21 ENCOUNTER — Encounter: Payer: Self-pay | Admitting: Internal Medicine

## 2023-05-22 ENCOUNTER — Ambulatory Visit (HOSPITAL_COMMUNITY)
Admission: RE | Admit: 2023-05-22 | Discharge: 2023-05-22 | Disposition: A | Discharge status: Home / Self Care | Payer: Medicare HMO | Source: Ambulatory Visit | Attending: Nephrology | Admitting: Nephrology

## 2023-05-22 DIAGNOSIS — N189 Chronic kidney disease, unspecified: Secondary | ICD-10-CM | POA: Insufficient documentation

## 2023-05-22 DIAGNOSIS — D631 Anemia in chronic kidney disease: Secondary | ICD-10-CM | POA: Diagnosis present

## 2023-05-22 MED ORDER — SODIUM CHLORIDE 0.9 % IV SOLN
200.0000 mg | INTRAVENOUS | Status: DC
  Administered 2023-05-22: 200 mg via INTRAVENOUS
  Filled 2023-05-22: qty 200

## 2023-05-24 ENCOUNTER — Encounter (HOSPITAL_COMMUNITY): Payer: Self-pay | Admitting: Internal Medicine

## 2023-06-11 NOTE — Progress Notes (Unsigned)
Patient name: ABBOTT MICHAELIS MRN: [id number] DOB: July 03, 1946 Sex: male  REASON FOR CONSULT: 1 month follow-up chronic mesenteric ischemia/renal artery stenosis  HPI: [name] is a 77 y.o. male, with history of coronary artery disease, diabetes, hypertension, hyperlipidemia, CKD, history of femoral-femoral bypass that presents for interval 1 month follow-up for ongoing evaluation of chronic mesenteric ischemia (and also renal artery stenosis). 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  The right renal was not seen and there was no significant stenosis of the left renal. He was referred by GI but his symptoms did not sound like classic mesenteric ischemia and he was going to undergo EGD and colonoscopy.  He did have his EGD and colonoscopy on 05/16/23 showing grade D esophagitis and a stricture that was dilated.  States he is doing better.  He is gaining weight and is up 3 pounds since last visit.  No significant postprandial abdominal pain.   Well known to our practice and had a femorofemoral bypass with Dr. Madilyn Fireman years ago.  As recently as 08/10/2016 he had a redo right femoral artery exposure with iliofemoral endarterectomy bovine patch and redo right limb of his femorofemoral bypass by Dr. Myra Gianotti.  On 09/19/2016 he then had ultrasound-guided access of left brachial artery with angioplasty of the right common femoral artery, stent of the right external iliac, and stent of the right common femoral artery also by Dr. Myra Gianotti.  I then performed right common iliac artery angioplast y and stenting with a 7 x 29 VBX as well as a drug-coated balloon angioplasty of the right external iliac artery on 09/22/21 for a recurrent high grade stenosis.      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   x 5   Hypercholesteremia    Hypertension    Neuropathy    Peripheral vascular disease (HCC)    s/p R-L FEM-FEM BYPASS   Pneumonia    Tachycardia    after CABG, pt on medicine for this    Past Surgical History:  Procedure Laterality Date   ABDOMINAL AORTOGRAM Bilateral 09/19/2016   Procedure: iliac;  Surgeon: Nada Libman, MD;  Location: [facility];  Service: Cardiovascular;  Laterality: Bilateral;   ABDOMINAL AORTOGRAM W/LOWER EXTREMITY N/A 09/22/2021   Procedure: ABDOMINAL AORTOGRAM W/LOWER EXTREMITY;  Surgeon: Cephus Shelling, MD;  Location: [facility];  Service: Cardiovascular;  Laterality: N/A;   ABDOMINAL AORTOGRAM W/LOWER EXTREMITY N/A 10/05/2022   Procedure: ABDOMINAL AORTOGRAM W/LOWER EXTREMITY;  Surgeon: Cephus Shelling, MD;  Location: [facility];  Service: Cardiovascular;  Laterality: N/A;   BIOPSY  05/16/2023   Procedure: BIOPSY;  Surgeon: Corbin Ade, MD;  [facility]: [facility];  Service: Endoscopy;;   CARDIAC CATHETERIZATION  2003   with stent   CARDIAC CATHETERIZATION N/A 10/11/2015   Procedure: Left Heart Cath and Coronary Angiography;  Surgeon: Peter M Swaziland, MD;  Location: [facility] INVASIVE CV LAB;  Service: Cardiovascular;  Laterality: N/A;   COLON RESECTION N/A 12/13/2017   Procedure: EXPLORATORY LAPAROTOMY, SIGMOID COLECTOMY WITH  COLOSTOMY;  Surgeon: Manus Rudd, MD;  Location: [facility] OR;  Service: General;  Laterality: N/A;   COLONOSCOPY N/A 09/22/2013   Procedure: COLONOSCOPY;  Surgeon: Corbin Ade, MD;  [facility];  Service: Endoscopy;  Laterality: N/A;  9:30 AM   COLONOSCOPY N/A 02/19/2018   Procedure: COLONOSCOPY;  Surgeon: Corbin Ade, MD;  [facility];  Service: Endoscopy;  Laterality: N/A;  2:00pm   COLONOSCOPY WITH  PROPOFOL N/A 05/16/2023   Procedure: COLONOSCOPY WITH PROPOFOL;  Surgeon: Corbin Ade, MD;  [facility];  Service: Endoscopy;  Laterality: N/A;  730am, asa 3   COLOSTOMY N/A 12/13/2017   Procedure: COLOSTOMY;  Surgeon: Manus Rudd, MD;  Location: [facility] OR;  Service: General;  Laterality: N/A;   COLOSTOMY REVERSAL N/A 03/26/2018   Procedure: COLOSTOMY REVERSAL;  Surgeon: Manus Rudd, MD;  Location: [facility] OR;  Service: General;  Laterality: N/A;   CORONARY ARTERY BYPASS GRAFT N/A 10/18/2015   Procedure: CORONARY ARTERY BYPASS GRAFTING (CABG) x  five, using left internal mammary artery and right leg greater saphenous vein harvested endoscopically;  Surgeon: Loreli Slot, MD;  Location: [facility] OR;  Service: Open Heart Surgery;  Laterality: N/A;   ENDARTERECTOMY FEMORAL Right 08/09/2016   Procedure: ENDARTERECTOMY FEMORAL WITH VEIN PATCH ANGIOPLASTY;  Surgeon: Nada Libman, MD;  Location: [facility] OR;  Service: Vascular;  Laterality: Right;   ESOPHAGOGASTRODUODENOSCOPY N/A 10/24/2017   Dr. Jena Gauss: hiatal hernia   ESOPHAGOGASTRODUODENOSCOPY (EGD) WITH PROPOFOL N/A 05/16/2023   Procedure: ESOPHAGOGASTRODUODENOSCOPY (EGD) WITH PROPOFOL;  Surgeon: Corbin Ade, MD;  [facility];  Service: Endoscopy;  Laterality: N/A;   FEMORAL-FEMORAL BYPASS GRAFT Bilateral 08/09/2016   Procedure: REVISION BYPASS GRAFT RIGHT FEMORAL-LEFT FEMORAL ARTERY;  Surgeon: Nada Libman, MD;  Location: [facility] OR;  Service: Vascular;  Laterality: Bilateral;   FEMORAL-POPLITEAL BYPASS GRAFT     INTRAMEDULLARY (IM) NAIL INTERTROCHANTERIC Left 04/03/2021   Procedure: LEFT  HIP INTRAMEDULLARY (IM) NAIL INTERTROCHANTRIC;  Surgeon: Kathryne Hitch, MD;  Location: [facility];  Service: Orthopedics;  Laterality: Left;   MALONEY DILATION N/A 05/16/2023   Procedure: Elease Hashimoto DILATION;  Surgeon: Corbin Ade, MD;  [facility];  Service: Endoscopy;  Laterality: N/A;   PERIPHERAL VASCULAR BALLOON ANGIOPLASTY  Right 09/22/2021   Procedure: PERIPHERAL VASCULAR BALLOON ANGIOPLASTY;  Surgeon: Cephus Shelling, MD;  [facility];  Service: Cardiovascular;  Laterality: Right;   PERIPHERAL VASCULAR CATHETERIZATION N/A 05/08/2016   Procedure: Lower Extremity Angiography;  Surgeon: Runell Gess, MD;  Location: [facility] INVASIVE CV LAB;  Service: Cardiovascular;  Laterality: N/A;   PERIPHERAL VASCULAR INTERVENTION Right 09/19/2016   Procedure: Peripheral Vascular Intervention;  Surgeon: Nada Libman, MD;  [facility]: [facility];  Service: Cardiovascular;  Laterality: Right;  ext iliac stent   PERIPHERAL VASCULAR INTERVENTION Right 09/22/2021   Procedure: PERIPHERAL VASCULAR INTERVENTION;  Surgeon: Cephus Shelling, MD;  [facility]: [facility];  Service: Cardiovascular;  Laterality: Right;   POLYPECTOMY  02/19/2018   Procedure: POLYPECTOMY;  Surgeon: Corbin Ade, MD;  [facility];  Service: Endoscopy;;   PROCTOSCOPY  10/24/2017   Procedure: PROCTOSCOPY;  Surgeon: Corbin Ade, MD;  [facility];  Service: Endoscopy;;   TEE WITHOUT CARDIOVERSION N/A 10/18/2015   Procedure: TRANSESOPHAGEAL ECHOCARDIOGRAM (TEE);  Surgeon: Loreli Slot, MD;  Location: [facility] OR;  Service: Open Heart Surgery;  Laterality: N/A;    Family History  Problem Relation  Age of Onset   Heart attack Mother    Stroke Mother    Colon cancer Brother        late 84s    SOCIAL HISTORY: Social History   Socioeconomic History   Marital status: Married    Spouse name: Not on file   Number of children: Not on file   Years of education: Not on file   Highest education level: Not on file  Occupational History   Not on file  Tobacco Use   Smoking status: Former    Current packs/day: 0.00    Average packs/day: 2.0 packs/day for 30.0 years (60.0 ttl pk-yrs)    Types: Cigarettes    Start date: 08/14/1970    Quit date: 08/14/2000    Years since quitting: 22.8   Smokeless tobacco:  Never  Vaping Use   Vaping status: Never Used  Substance and Sexual Activity   Alcohol use: No   Drug use: No   Sexual activity: Not Currently  Other Topics Concern   Not on file  Social History Narrative   Not on file   Social Determinants of Health   Financial Resource Strain: Low Risk  (04/25/2021)   Received from [facility], [facility]   Overall Financial Resource Strain (CARDIA)    Difficulty of Paying Living Expenses: Not very hard  Food Insecurity: No Food Insecurity (04/25/2021)   Received from [facility], [facility]   Hunger Vital Sign    Worried About Running Out of Food in the Last Year: Never true    Ran Out of Food in the Last Year: Never true  Transportation Needs: No Transportation Needs (04/25/2021)   Received from [facility], [facility]   PRAPARE - Transportation    Lack of Transportation (Medical): No    Lack of Transportation (Non-Medical): No  Physical Activity: Not on file  Stress: Not on file  Social Connections: Not on file  Intimate Partner Violence: Not on file    Allergies  Allergen Reactions   Acarbose Diarrhea    Current Outpatient Medications  Medication Sig Dispense Refill   amLODipine (NORVASC) 10 MG tablet Take 10 mg by mouth in the morning.     aspirin EC 81 MG tablet Take 1 tablet (81 mg total) by mouth daily with breakfast. 30 tablet 2   atorvastatin (LIPITOR) 40 MG tablet Take 1 tablet (40 mg total) by mouth daily at 6 PM. (Patient taking differently: Take 40 mg by mouth at bedtime.) 30 tablet 1   cholestyramine (QUESTRAN) 4 g packet Take 0.5 packets (2 g total) by mouth daily.     cilostazol (PLETAL) 100 MG tablet Take 100 mg by mouth 2 (two) times daily.     clopidogrel (PLAVIX) 75 MG tablet Take 75 mg by mouth in the morning.     empagliflozin (JARDIANCE) 25 MG TABS tablet Take 12.5 mg by mouth in the morning.     hydrochlorothiazide (HYDRODIURIL) 12.5 MG tablet Take 12.5 mg by mouth in the morning.      insulin glargine (LANTUS) 100 UNIT/ML injection Inject 20 Units into the skin at bedtime.     losartan (COZAAR) 50 MG tablet Take 50 mg by mouth daily.     metoprolol tartrate (LOPRESSOR) 50 MG tablet Take 25 mg by mouth 2 (two) times daily.      pantoprazole (PROTONIX) 40 MG tablet Take 1 tablet (40 mg total) by mouth 2 (two) times daily before a meal.  60 tablet 11   pregabalin (LYRICA) 75 MG capsule Take 75 mg by mouth 2 (two) times daily.     vitamin B-12 (CYANOCOBALAMIN) 1000 MCG tablet Take 1,000 mcg by mouth in the morning.     No current facility-administered medications for this visit.    REVIEW OF SYSTEMS:  [X]  denotes positive finding, [ ]  denotes negative finding Cardiac  Comments:  Chest pain or chest pressure:    Shortness of breath upon exertion:    Short of breath when lying flat:    Irregular heart rhythm:        Vascular    Pain in calf, thigh, or hip brought on by ambulation:    Pain in feet at night that wakes you up from your sleep:     Blood clot in your veins:    Leg swelling:         Pulmonary    Oxygen at home:    Productive cough:     Wheezing:         Neurologic    Sudden weakness in arms or legs:     Sudden numbness in arms or legs:     Sudden onset of difficulty speaking or slurred speech:    Temporary loss of vision in one eye:     Problems with dizziness:         Gastrointestinal    Blood in stool:     Vomited blood:         Genitourinary    Burning when urinating:     Blood in urine:        Psychiatric    Major depression:         Hematologic    Bleeding problems:    Problems with blood clotting too easily:        Skin    Rashes or ulcers:        Constitutional    Fever or chills:      PHYSICAL EXAM: There were no vitals filed for this visit.   GENERAL: The patient is a well-nourished male, in no acute distress. The vital signs are documented above. CARDIAC: There is a regular rate and rhythm.  VASCULAR:  Palpable femoral  pulse bilaterally Palpable pulse in femorofemoral bypass PULMONARY: No respiratory distress. ABDOMEN: Soft and non-tender. MUSCULOSKELETAL: There are no major deformities or cyanosis. NEUROLOGIC: No focal weakness or paresthesias are detected. PSYCHIATRIC: The patient has a normal affect.  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  Unable to identify the SMA.  Unable to visualize right renal artery.  No significant left renal artery stenosis.  Assessment/Plan:  77 yo M that has been followed by our practice since a femorofemoral bypass with Dr. Madilyn Fireman years ago.  As recently as 08/10/2016 he had a redo right femoral artery exposure with iliofemoral endarterectomy bovine patch and redo right limb of his femorofemoral bypass by Dr. Myra Gianotti.  On 09/19/2016 he then had ultrasound-guided access of left brachial artery with angioplasty of the right common femoral artery, stent of the right external iliac, and stent of the right common femoral artery also by Dr. Myra Gianotti.  I then performed right common iliac artery angioplasty and stenting with a 7 x 29 VBX as well as a drug-coated balloon angioplasty of the right external iliac artery on 09/22/21 for a recurrent high grade stenosis.  He is now here for follow-up of his initial evaluation of chronic mesenteric ischemia.  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  The SMA was not visualized.  I did not want to pursue mesenteric intervention until he had his EGD and colonoscopy as his symptoms did not sound like classic mesenteric ischemia.  He since had an EGD and colonoscopy on 05/16/2023 showing grade D esophagitis and a stricture that was dilated.  He now seems to be doing very well from a clinical standpoint.  He has no significant abdominal pain or postprandial abdominal pain.  He is gaining weight.  I do not think he merits any intervention on his mesenteric arteries at  this time.  Will keep his follow-up on 07/03/2023 in the [facility] clinic since I am off that week.    Cephus Shelling, MD [facility] Office: [phone]

## 2023-06-12 ENCOUNTER — Encounter: Payer: Self-pay | Admitting: Vascular Surgery

## 2023-06-12 ENCOUNTER — Ambulatory Visit (INDEPENDENT_AMBULATORY_CARE_PROVIDER_SITE_OTHER): Payer: No Typology Code available for payment source | Admitting: Vascular Surgery

## 2023-06-12 VITALS — BP 135/72 | HR 86 | Temp 98.4°F | Ht 68.0 in | Wt 139.0 lb

## 2023-06-12 DIAGNOSIS — I739 Peripheral vascular disease, unspecified: Secondary | ICD-10-CM | POA: Diagnosis not present

## 2023-06-12 DIAGNOSIS — K551 Chronic vascular disorders of intestine: Secondary | ICD-10-CM | POA: Diagnosis not present

## 2023-07-03 ENCOUNTER — Ambulatory Visit (HOSPITAL_COMMUNITY): Payer: No Typology Code available for payment source

## 2023-07-03 ENCOUNTER — Ambulatory Visit: Payer: No Typology Code available for payment source

## 2023-07-10 ENCOUNTER — Ambulatory Visit (INDEPENDENT_AMBULATORY_CARE_PROVIDER_SITE_OTHER): Payer: Medicare HMO

## 2023-07-10 ENCOUNTER — Ambulatory Visit (INDEPENDENT_AMBULATORY_CARE_PROVIDER_SITE_OTHER): Payer: Medicare HMO | Admitting: Physician Assistant

## 2023-07-10 VITALS — BP 158/72 | HR 87 | Ht 68.0 in | Wt 142.0 lb

## 2023-07-10 DIAGNOSIS — K551 Chronic vascular disorders of intestine: Secondary | ICD-10-CM | POA: Diagnosis not present

## 2023-07-10 DIAGNOSIS — I771 Stricture of artery: Secondary | ICD-10-CM

## 2023-07-10 DIAGNOSIS — I739 Peripheral vascular disease, unspecified: Secondary | ICD-10-CM

## 2023-07-10 LAB — VAS US ABI WITH/WO TBI: Right ABI: 0.71

## 2023-07-10 NOTE — Progress Notes (Signed)
Office Note     CC:  follow up Requesting Provider:  Garey Ham, MD  HPI: [name] is a 77 y.o. (11-Aug-1946) male who presents for surveillance of PAD.  He is well-known to [facility] with an extensive surgical history including a right to left femoral to femoral bypass with Dr. Madilyn Fireman years ago.  In 2017 he underwent redo right femoral artery exposure with iliofemoral endarterectomy and bovine patch angioplasty and redo right limb of his femorofemoral bypass by Dr. Myra Gianotti.  In 2018 he had ultrasound guided access of the left brachial artery with angioplasty of the right common femoral artery and stenting of the right external iliac and common femoral artery by Dr. Myra Gianotti.  In February 2023 he underwent right common iliac artery angioplasty and stenting due to recurrent iliac stenosis.  He also underwent diagnostic aortogram in February of this year due to duplex suggesting elevated inflow velocities to the femorofemoral bypass graft.  He denies any claudication, rest pain, or tissue loss of bilateral lower extremities.  He is taking an aspirin, statin, Plavix daily.  He is a former smoker.  He is also followed for mesenteric artery stenosis.  He has been evaluated by Dr. Chestine Spore last month and no intervention was pursued due to improvement in symptoms.  He underwent EGD with GI demonstrating esophagitis.  He also underwent dilation of esophageal stricture.  He says he is doing better since the procedure.  He is no longer gaining weight however denies postprandial pain.  Admittedly he does not have much of an appetite anymore however this is not related to pain.  He continues to have diarrhea which has been ongoing for the last several weeks to months.  He has a follow-up with GI next month.   Past Medical History:  Diagnosis Date   Anemia    CAD S/P percutaneous coronary angioplasty 1998   PCI TO CX   CKD (chronic kidney disease) stage 3, GFR 30-59 ml/min (HCC)    Diabetes mellitus without  complication (HCC)    Diverticulitis 08/16/2017   hospitalized with diverticulitis/sepsis   GERD (gastroesophageal reflux disease)    occassionally   Hx of CABG 10/2015   x 5   Hypercholesteremia    Hypertension    Neuropathy    Peripheral vascular disease (HCC)    s/p R-L FEM-FEM BYPASS   Pneumonia    Tachycardia    after CABG, pt on medicine for this    Past Surgical History:  Procedure Laterality Date   ABDOMINAL AORTOGRAM Bilateral 09/19/2016   Procedure: iliac;  Surgeon: Nada Libman, MD;  [facility]: [facility];  Service: Cardiovascular;  Laterality: Bilateral;   ABDOMINAL AORTOGRAM W/LOWER EXTREMITY N/A 09/22/2021   Procedure: ABDOMINAL AORTOGRAM W/LOWER EXTREMITY;  Surgeon: Cephus Shelling, MD;  Location: [facility];  Service: Cardiovascular;  Laterality: N/A;   ABDOMINAL AORTOGRAM W/LOWER EXTREMITY N/A 10/05/2022   Procedure: ABDOMINAL AORTOGRAM W/LOWER EXTREMITY;  Surgeon: Cephus Shelling, MD;  [facility]: [facility];  Service: Cardiovascular;  Laterality: N/A;   BIOPSY  05/16/2023   Procedure: BIOPSY;  Surgeon: Corbin Ade, MD;  [facility];  Service: Endoscopy;;   CARDIAC CATHETERIZATION  2003   with stent   CARDIAC CATHETERIZATION N/A 10/11/2015   Procedure: Left Heart Cath and Coronary Angiography;  Surgeon: Peter M Swaziland, MD;  Location: [facility] INV[facility] CV LAB;  Service: Cardiovascular;  Laterality: N/A;   COLON RESECTION N/A 12/13/2017   Procedure: EXPLORATORY LAPAROTOMY,  SIGMOID COLECTOMY WITH COLOSTOMY;  Surgeon: Manus Rudd, MD;  Location: [facility] OR;  Service: General;  Laterality: N/A;   COLONOSCOPY N/A 09/22/2013   Procedure: COLONOSCOPY;  Surgeon: Corbin Ade, MD;  [facility];  Service: Endoscopy;  Laterality: N/A;  9:30 AM   COLONOSCOPY N/A 02/19/2018   Procedure: COLONOSCOPY;  Surgeon: Corbin Ade, MD;  [facility];  Service: Endoscopy;  Laterality: N/A;  2:00pm   COLONOSCOPY WITH PROPOFOL N/A  05/16/2023   Procedure: COLONOSCOPY WITH PROPOFOL;  Surgeon: Corbin Ade, MD;  [facility];  Service: Endoscopy;  Laterality: N/A;  730am, asa 3   COLOSTOMY N/A 12/13/2017   Procedure: COLOSTOMY;  Surgeon: Manus Rudd, MD;  Location: [facility] OR;  Service: General;  Laterality: N/A;   COLOSTOMY REVERSAL N/A 03/26/2018   Procedure: COLOSTOMY REVERSAL;  Surgeon: Manus Rudd, MD;  Location: [facility];  Service: General;  Laterality: N/A;   CORONARY ARTERY BYPASS GRAFT N/A 10/18/2015   Procedure: CORONARY ARTERY BYPASS GRAFTING (CABG) x  five, using left internal mammary artery and right leg greater saphenous vein harvested endoscopically;  Surgeon: Loreli Slot, MD;  Location: [facility] OR;  Service: Open Heart Surgery;  Laterality: N/A;   ENDARTERECTOMY FEMORAL Right 08/09/2016   Procedure: ENDARTERECTOMY FEMORAL WITH VEIN PATCH ANGIOPLASTY;  Surgeon: Nada Libman, MD;  Location: [facility];  Service: Vascular;  Laterality: Right;   ESOPHAGOGASTRODUODENOSCOPY N/A 10/24/2017   Dr. Jena Gauss: hiatal hernia   ESOPHAGOGASTRODUODENOSCOPY (EGD) WITH PROPOFOL N/A 05/16/2023   Procedure: ESOPHAGOGASTRODUODENOSCOPY (EGD) WITH PROPOFOL;  Surgeon: Corbin Ade, MD;  [facility];  Service: Endoscopy;  Laterality: N/A;   FEMORAL-FEMORAL BYPASS GRAFT Bilateral 08/09/2016   Procedure: REVISION BYPASS GRAFT RIGHT FEMORAL-LEFT FEMORAL ARTERY;  Surgeon: Nada Libman, MD;  Location: [facility];  Service: Vascular;  Laterality: Bilateral;   FEMORAL-POPLITEAL BYPASS GRAFT     INTRAMEDULLARY (IM) NAIL INTERTROCHANTERIC Left 04/03/2021   Procedure: LEFT  HIP INTRAMEDULLARY (IM) NAIL INTERTROCHANTRIC;  Surgeon: Kathryne Hitch, MD;  Location: [facility];  Service: Orthopedics;  Laterality: Left;   MALONEY DILATION N/A 05/16/2023   Procedure: Elease Hashimoto DILATION;  Surgeon: Corbin Ade, MD;  [facility];  Service: Endoscopy;  Laterality: N/A;   PERIPHERAL VASCULAR BALLOON ANGIOPLASTY Right  09/22/2021   Procedure: PERIPHERAL VASCULAR BALLOON ANGIOPLASTY;  Surgeon: Cephus Shelling, MD;  [facility];  Service: Cardiovascular;  Laterality: Right;   PERIPHERAL VASCULAR CATHETERIZATION N/A 05/08/2016   Procedure: Lower Extremity Angiography;  Surgeon: Runell Gess, MD;  Location: [facility] INVASIVE CV LAB;  Service: Cardiovascular;  Laterality: N/A;   PERIPHERAL VASCULAR INTERVENTION Right 09/19/2016   Procedure: Peripheral Vascular Intervention;  Surgeon: Nada Libman, MD;  [facility];  Service: Cardiovascular;  Laterality: Right;  ext iliac stent   PERIPHERAL VASCULAR INTERVENTION Right 09/22/2021   Procedure: PERIPHERAL VASCULAR INTERVENTION;  Surgeon: Cephus Shelling, MD;  [facility];  Service: Cardiovascular;  Laterality: Right;   POLYPECTOMY  02/19/2018   Procedure: POLYPECTOMY;  Surgeon: Corbin Ade, MD;  [facility];  Service: Endoscopy;;   PROCTOSCOPY  10/24/2017   Procedure: PROCTOSCOPY;  Surgeon: Corbin Ade, MD;  [facility];  Service: Endoscopy;;   TEE WITHOUT CARDIOVERSION N/A 10/18/2015   Procedure: TRANSESOPHAGEAL ECHOCARDIOGRAM (TEE);  Surgeon: Loreli Slot, MD;  Location: [facility] OR;  Service: Open Heart Surgery;  Laterality: N/A;    Social History  Socioeconomic History   Marital status: Married    Spouse name: Not on file   Number of children: Not on file   Years of education: Not on file   Highest education level: Not on file  Occupational History   Not on file  Tobacco Use   Smoking status: Former    Current packs/day: 0.00    Average packs/day: 2.0 packs/day for 30.0 years (60.0 ttl pk-yrs)    Types: Cigarettes    Start date: 08/14/1970    Quit date: 08/14/2000    Years since quitting: 22.9   Smokeless tobacco: Never  Vaping Use   Vaping status: Never Used  Substance and Sexual Activity   Alcohol use: No   Drug use: No   Sexual activity: Not Currently  Other  Topics Concern   Not on file  Social History Narrative   Not on file   Social Determinants of Health   Financial Resource Strain: Low Risk  (04/25/2021)   Received from [facility], [facility]   Overall Financial Resource Strain (CARDIA)    Difficulty of Paying Living Expenses: Not very hard  Food Insecurity: No Food Insecurity (04/25/2021)   Received from [facility], [facility]   Hunger Vital Sign    Worried About Running Out of Food in the Last Year: Never true    Ran Out of Food in the Last Year: Never true  Transportation Needs: No Transportation Needs (04/25/2021)   Received from [facility], [facility]   PRAPARE - Transportation    Lack of Transportation (Medical): No    Lack of Transportation (Non-Medical): No  Physical Activity: Not on file  Stress: Not on file  Social Connections: Not on file  Intimate Partner Violence: Not on file    Family History  Problem Relation Age of Onset   Heart attack Mother    Stroke Mother    Colon cancer Brother        late 1s    Current Outpatient Medications  Medication Sig Dispense Refill   amLODipine (NORVASC) 10 MG tablet Take 10 mg by mouth in the morning.     aspirin EC 81 MG tablet Take 1 tablet (81 mg total) by mouth daily with breakfast. 30 tablet 2   atorvastatin (LIPITOR) 40 MG tablet Take 1 tablet (40 mg total) by mouth daily at 6 PM. (Patient taking differently: Take 40 mg by mouth at bedtime.) 30 tablet 1   cholestyramine (QUESTRAN) 4 g packet Take 0.5 packets (2 g total) by mouth daily.     cilostazol (PLETAL) 100 MG tablet Take 100 mg by mouth 2 (two) times daily.     clopidogrel (PLAVIX) 75 MG tablet Take 75 mg by mouth in the morning.     empagliflozin (JARDIANCE) 25 MG TABS tablet Take 12.5 mg by mouth in the morning.     hydrochlorothiazide (HYDRODIURIL) 12.5 MG tablet Take 12.5 mg by mouth in the morning.     insulin glargine (LANTUS) 100 UNIT/ML injection Inject 20 Units into the skin  at bedtime.     losartan (COZAAR) 50 MG tablet Take 50 mg by mouth daily.     metoprolol tartrate (LOPRESSOR) 50 MG tablet Take 25 mg by mouth 2 (two) times daily.      pantoprazole (PROTONIX) 40 MG tablet Take 1 tablet (40 mg total) by mouth 2 (two) times daily before a meal. 60 tablet 11   pregabalin (LYRICA) 75 MG capsule Take 75  mg by mouth 2 (two) times daily.     vitamin B-12 (CYANOCOBALAMIN) 1000 MCG tablet Take 1,000 mcg by mouth in the morning.     No current facility-administered medications for this visit.    Allergies  Allergen Reactions   Acarbose Diarrhea     REVIEW OF SYSTEMS:   [X]  denotes positive finding, [ ]  denotes negative finding Cardiac  Comments:  Chest pain or chest pressure:    Shortness of breath upon exertion:    Short of breath when lying flat:    Irregular heart rhythm:        Vascular    Pain in calf, thigh, or hip brought on by ambulation:    Pain in feet at night that wakes you up from your sleep:     Blood clot in your veins:    Leg swelling:         Pulmonary    Oxygen at home:    Productive cough:     Wheezing:         Neurologic    Sudden weakness in arms or legs:     Sudden numbness in arms or legs:     Sudden onset of difficulty speaking or slurred speech:    Temporary loss of vision in one eye:     Problems with dizziness:         Gastrointestinal    Blood in stool:     Vomited blood:         Genitourinary    Burning when urinating:     Blood in urine:        Psychiatric    Major depression:         Hematologic    Bleeding problems:    Problems with blood clotting too easily:        Skin    Rashes or ulcers:        Constitutional    Fever or chills:      PHYSICAL EXAMINATION:  Vitals:   07/10/23 0956  BP: (!) 158/72  Pulse: 87  Weight: 142 lb (64.4 kg)  Height: 5\' 8"  (1.727 m)    General:  WDWN in NAD; vital signs documented above Gait: Not observed HENT: WNL, normocephalic Pulmonary: normal  non-labored breathing , without Rales, rhonchi,  wheezing Cardiac: regular HR Abdomen: soft, NT, no masses Skin: without rashes Vascular Exam/Pulses: Without pedal pulses; 2+ palpable femoral pulses as well as 2+ palpable pulse in the femorofemoral bypass graft Extremities: without ischemic changes, without Gangrene , without cellulitis; without open wounds;  Musculoskeletal: no muscle wasting or atrophy  Neurologic: A&O X 3 Psychiatric:  The pt has Normal affect.   Non-Invasive Vascular Imaging:   Aortoiliac duplex demonstrates widely patent right common iliac artery; 323 cm/s velocity in the proximal right external iliac artery however biphasic flow throughout this area  ABI/TBIToday's ABIToday's TBIPrevious ABIPrevious TBI  +-------+-----------+-----------+------------+------------+  Right 0.71       0.39       0.9         0.41          +-------+-----------+-----------+------------+------------+  Left  [place]         0.3        0.84        0.27           ASSESSMENT/PLAN:: 77 y.o. male here for follow up for surveillance of PAD including aortoiliac occlusive disease as well as history of femorofemoral bypass graft  Mr.  Kristoph Maheshwari is a 77 year old male well-known to [facility] with numerous procedures in the past.  Subjectively he is doing well and not experiencing ischemic symptoms of the bilateral lower extremities.  Aortoiliac duplex demonstrates patent right iliac system.  He has had recurrent iliac stenosis in the past and has an elevated velocity in his external iliac artery today however angiography from February of this year was without any flow-limiting stenosis.  He also has 2+ palpable femoral pulses as well as 2+ palpable pulse in the femorofemoral bypass graft itself.  He will continue his aspirin, Plavix, statin daily.  I have encouraged him to increase his mobility and activity level.  We will repeat aortoiliac duplex and femorofemoral duplex in 6 months.  He has also  been evaluated by Dr. Chestine Spore for mesenteric artery stenosis with elevated velocities in the celiac and IMA.  He underwent EGD with esophageal dilation.  He was noted to have esophagitis.  Since his procedure he states he is doing much better and is without pain or postprandial pain.  We will repeat mesenteric duplex in 6 months with his aorta iliac studies.  Mr. Streight knows to call/return office sooner with any questions or concerns.   Emilie Rutter, PA-C [facility] [phone]  Clinic MD:   Randie Heinz on call

## 2023-07-17 ENCOUNTER — Other Ambulatory Visit: Payer: Self-pay

## 2023-07-17 DIAGNOSIS — I714 Abdominal aortic aneurysm, without rupture, unspecified: Secondary | ICD-10-CM

## 2023-07-17 DIAGNOSIS — I771 Stricture of artery: Secondary | ICD-10-CM

## 2023-07-17 DIAGNOSIS — K551 Chronic vascular disorders of intestine: Secondary | ICD-10-CM

## 2023-07-17 DIAGNOSIS — N289 Disorder of kidney and ureter, unspecified: Secondary | ICD-10-CM

## 2023-07-17 DIAGNOSIS — I739 Peripheral vascular disease, unspecified: Secondary | ICD-10-CM

## 2023-07-23 ENCOUNTER — Other Ambulatory Visit: Payer: Self-pay | Admitting: Thoracic Surgery (Cardiothoracic Vascular Surgery)

## 2023-07-23 DIAGNOSIS — R911 Solitary pulmonary nodule: Secondary | ICD-10-CM

## 2023-08-28 ENCOUNTER — Encounter: Payer: Self-pay | Admitting: Thoracic Surgery (Cardiothoracic Vascular Surgery)

## 2023-08-28 ENCOUNTER — Ambulatory Visit (INDEPENDENT_AMBULATORY_CARE_PROVIDER_SITE_OTHER): Payer: Medicare HMO | Admitting: Thoracic Surgery (Cardiothoracic Vascular Surgery)

## 2023-08-28 ENCOUNTER — Ambulatory Visit
Admission: RE | Admit: 2023-08-28 | Discharge: 2023-08-28 | Disposition: A | Discharge status: Home / Self Care | Payer: Medicare HMO | Source: Ambulatory Visit | Attending: Thoracic Surgery (Cardiothoracic Vascular Surgery) | Admitting: Thoracic Surgery (Cardiothoracic Vascular Surgery)

## 2023-08-28 VITALS — BP 151/66 | HR 84 | Resp 20 | Ht 68.0 in | Wt 133.0 lb

## 2023-08-28 DIAGNOSIS — R911 Solitary pulmonary nodule: Secondary | ICD-10-CM

## 2023-08-28 NOTE — Progress Notes (Signed)
 [address]       [place] [address]             [phone]     HPI: Mr. Antonini returns for follow-up of pulmonary abnormalities on his chest CT.  Lola Lofaro is a 78 year old man with a history of tobacco abuse (quit 2002), lung nodules and other pulmonary opacities, hypertension, hyperlipidemia, atherosclerotic cardiovascular disease, CABG, PAD, reflux, type 2 diabetes, stage III chronic kidney disease, and recurrent pneumonias.  He had CABG in 2017.  He was found to have cavitary lung nodule on his preoperative chest x-ray.  CT showed multiple small nodules and groundglass opacities bilaterally.  He has been followed since then and the areas have waxed and waned.  Currently feels well.  He had pneumonia back in the summer.  Occasional cough.  No anginal symptoms or shortness of breath.  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   x 5   Hypercholesteremia    Hypertension    Neuropathy    Peripheral vascular disease (HCC)    s/p R-L FEM-FEM BYPASS   Pneumonia    Tachycardia    after CABG, pt on medicine for this    Current Outpatient Medications  Medication Sig Dispense Refill   amLODipine  (NORVASC ) 10 MG tablet Take 10 mg by mouth in the morning.     aspirin  EC 81 MG tablet Take 1 tablet (81 mg total) by mouth daily with breakfast. 30 tablet 2   atorvastatin  (LIPITOR ) 40 MG tablet Take 1 tablet (40 mg total) by mouth daily at 6 PM. (Patient taking differently: Take 40 mg by mouth at bedtime.) 30 tablet 1   cholestyramine (QUESTRAN) 4 g packet Take 0.5 packets (2 g total) by mouth daily.     cilostazol  (PLETAL ) 100 MG tablet  Take 100 mg by mouth 2 (two) times daily.     clopidogrel  (PLAVIX ) 75 MG tablet Take 75 mg by mouth in the morning.     empagliflozin  (JARDIANCE ) 25 MG TABS tablet Take 12.5 mg by mouth in the morning.     hydrochlorothiazide  (HYDRODIURIL ) 12.5 MG tablet Take 12.5 mg by mouth in the morning.     insulin  glargine (LANTUS ) 100 UNIT/ML injection Inject 20 Units into the skin at bedtime.     losartan  (COZAAR ) 50 MG tablet Take 50 mg by mouth daily.     metoprolol  tartrate (LOPRESSOR ) 50 MG tablet Take 25 mg by mouth 2 (two) times daily.      pantoprazole  (PROTONIX ) 40 MG tablet Take 1 tablet (40 mg total) by mouth 2 (two) times daily before a meal. 60 tablet 11   pregabalin  (LYRICA ) 75 MG capsule Take 75 mg by mouth 2 (two) times daily.     vitamin B-12 (CYANOCOBALAMIN ) 1000 MCG tablet Take 1,000 mcg by mouth in the morning.     No current facility-administered medications for this visit.    Physical Exam BP (!) 151/66   Pulse 84   Resp 20   Ht 5' 8 (1.727 m)   Wt 133 lb (60.3 kg)   SpO2 93% Comment: RA  BMI 20.42 kg/m  78 year old man in no acute distress Alert and oriented x 3 with no focal deficits Lungs clear with no rales or wheezing Cardiac regular rate and rhythm No cervical or supraclavicular adenopathy  Diagnostic Tests: I personally reviewed the CT images from today.  Official radiology report not available.  I compared them to his scans from January and July 2024. Coronary and aortic atherosclerosis.  Left upper lobe pneumonia resolved compared to 7/24.  Patchy groundglass densities and scarring bilaterally.  Impression: Sheryl Towell is a 78 year old man with a history of tobacco abuse (quit 2002), lung nodules and other pulmonary opacities, hypertension, hyperlipidemia, atherosclerotic cardiovascular disease, CABG, PAD, reflux, type 2 diabetes, stage III chronic kidney disease, and recurrent pneumonias.  Groundglass opacities-await official radiology interpretation.  Some  waxing and waning, but no major difference.  Tentatively plan to repeat a scan in a year.  I did discuss a possible referral to a pulmonologist to get their opinion.  He is currently is not having symptoms and does not wish to pursue that further.  CAD-status post CABG 7 years ago.  No recurrent angina.  Plan: Follow-up on official radiology reading Return in 1 year with CT chest  Spent over 20 minutes in review of records, images, and in consultation with Mr. Wrightsman today. Elspeth JAYSON Millers, MD [facility] [phone]

## 2023-09-04 ENCOUNTER — Other Ambulatory Visit (HOSPITAL_COMMUNITY): Payer: Self-pay | Admitting: Nephrology

## 2023-09-04 DIAGNOSIS — E1122 Type 2 diabetes mellitus with diabetic chronic kidney disease: Secondary | ICD-10-CM

## 2023-09-04 DIAGNOSIS — N1832 Chronic kidney disease, stage 3b: Secondary | ICD-10-CM

## 2023-09-11 ENCOUNTER — Ambulatory Visit (HOSPITAL_COMMUNITY)
Admission: RE | Admit: 2023-09-11 | Discharge: 2023-09-11 | Disposition: A | Discharge status: Home / Self Care | Payer: Medicare HMO | Source: Ambulatory Visit | Attending: Nephrology | Admitting: Nephrology

## 2023-09-11 DIAGNOSIS — E1122 Type 2 diabetes mellitus with diabetic chronic kidney disease: Secondary | ICD-10-CM | POA: Diagnosis present

## 2023-09-11 DIAGNOSIS — N1832 Chronic kidney disease, stage 3b: Secondary | ICD-10-CM | POA: Diagnosis present

## 2023-12-07 ENCOUNTER — Emergency Department (HOSPITAL_COMMUNITY)

## 2023-12-07 ENCOUNTER — Inpatient Hospital Stay (HOSPITAL_COMMUNITY)
Admission: EM | Admit: 2023-12-07 | Discharge: 2023-12-10 | DRG: 194 | Disposition: A | Discharge status: Home / Self Care | Attending: Internal Medicine | Admitting: Internal Medicine

## 2023-12-07 ENCOUNTER — Other Ambulatory Visit: Payer: Self-pay

## 2023-12-07 ENCOUNTER — Encounter (HOSPITAL_COMMUNITY): Payer: Self-pay

## 2023-12-07 DIAGNOSIS — E785 Hyperlipidemia, unspecified: Secondary | ICD-10-CM | POA: Diagnosis not present

## 2023-12-07 DIAGNOSIS — Y95 Nosocomial condition: Secondary | ICD-10-CM | POA: Diagnosis present

## 2023-12-07 DIAGNOSIS — C9000 Multiple myeloma not having achieved remission: Secondary | ICD-10-CM | POA: Diagnosis present

## 2023-12-07 DIAGNOSIS — Z800 Family history of malignant neoplasm of digestive organs: Secondary | ICD-10-CM

## 2023-12-07 DIAGNOSIS — M898X9 Other specified disorders of bone, unspecified site: Secondary | ICD-10-CM | POA: Diagnosis not present

## 2023-12-07 DIAGNOSIS — M8458XA Pathological fracture in neoplastic disease, other specified site, initial encounter for fracture: Secondary | ICD-10-CM | POA: Diagnosis present

## 2023-12-07 DIAGNOSIS — E876 Hypokalemia: Secondary | ICD-10-CM | POA: Diagnosis not present

## 2023-12-07 DIAGNOSIS — R531 Weakness: Secondary | ICD-10-CM

## 2023-12-07 DIAGNOSIS — J849 Interstitial pulmonary disease, unspecified: Secondary | ICD-10-CM | POA: Diagnosis present

## 2023-12-07 DIAGNOSIS — J440 Chronic obstructive pulmonary disease with acute lower respiratory infection: Secondary | ICD-10-CM | POA: Diagnosis present

## 2023-12-07 DIAGNOSIS — H1132 Conjunctival hemorrhage, left eye: Secondary | ICD-10-CM | POA: Diagnosis not present

## 2023-12-07 DIAGNOSIS — I2510 Atherosclerotic heart disease of native coronary artery without angina pectoris: Secondary | ICD-10-CM | POA: Diagnosis present

## 2023-12-07 DIAGNOSIS — N182 Chronic kidney disease, stage 2 (mild): Secondary | ICD-10-CM | POA: Diagnosis present

## 2023-12-07 DIAGNOSIS — R55 Syncope and collapse: Secondary | ICD-10-CM | POA: Diagnosis present

## 2023-12-07 DIAGNOSIS — Z955 Presence of coronary angioplasty implant and graft: Secondary | ICD-10-CM | POA: Diagnosis not present

## 2023-12-07 DIAGNOSIS — C7951 Secondary malignant neoplasm of bone: Secondary | ICD-10-CM | POA: Diagnosis present

## 2023-12-07 DIAGNOSIS — I739 Peripheral vascular disease, unspecified: Secondary | ICD-10-CM | POA: Diagnosis present

## 2023-12-07 DIAGNOSIS — Z7902 Long term (current) use of antithrombotics/antiplatelets: Secondary | ICD-10-CM

## 2023-12-07 DIAGNOSIS — Z7982 Long term (current) use of aspirin: Secondary | ICD-10-CM | POA: Diagnosis not present

## 2023-12-07 DIAGNOSIS — Z8249 Family history of ischemic heart disease and other diseases of the circulatory system: Secondary | ICD-10-CM | POA: Diagnosis not present

## 2023-12-07 DIAGNOSIS — E782 Mixed hyperlipidemia: Secondary | ICD-10-CM | POA: Diagnosis not present

## 2023-12-07 DIAGNOSIS — E1122 Type 2 diabetes mellitus with diabetic chronic kidney disease: Secondary | ICD-10-CM | POA: Diagnosis present

## 2023-12-07 DIAGNOSIS — Z823 Family history of stroke: Secondary | ICD-10-CM

## 2023-12-07 DIAGNOSIS — M4805 Spinal stenosis, thoracolumbar region: Secondary | ICD-10-CM | POA: Diagnosis present

## 2023-12-07 DIAGNOSIS — N184 Chronic kidney disease, stage 4 (severe): Secondary | ICD-10-CM | POA: Diagnosis present

## 2023-12-07 DIAGNOSIS — I129 Hypertensive chronic kidney disease with stage 1 through stage 4 chronic kidney disease, or unspecified chronic kidney disease: Secondary | ICD-10-CM | POA: Diagnosis present

## 2023-12-07 DIAGNOSIS — Z8673 Personal history of transient ischemic attack (TIA), and cerebral infarction without residual deficits: Secondary | ICD-10-CM | POA: Diagnosis not present

## 2023-12-07 DIAGNOSIS — Z794 Long term (current) use of insulin: Secondary | ICD-10-CM

## 2023-12-07 DIAGNOSIS — Z79899 Other long term (current) drug therapy: Secondary | ICD-10-CM | POA: Diagnosis not present

## 2023-12-07 DIAGNOSIS — E1140 Type 2 diabetes mellitus with diabetic neuropathy, unspecified: Secondary | ICD-10-CM | POA: Diagnosis present

## 2023-12-07 DIAGNOSIS — J189 Pneumonia, unspecified organism: Secondary | ICD-10-CM | POA: Diagnosis present

## 2023-12-07 DIAGNOSIS — I951 Orthostatic hypotension: Secondary | ICD-10-CM | POA: Diagnosis not present

## 2023-12-07 DIAGNOSIS — M8959 Osteolysis, multiple sites: Secondary | ICD-10-CM | POA: Diagnosis not present

## 2023-12-07 DIAGNOSIS — I471 Supraventricular tachycardia, unspecified: Secondary | ICD-10-CM | POA: Diagnosis present

## 2023-12-07 DIAGNOSIS — Z951 Presence of aortocoronary bypass graft: Secondary | ICD-10-CM | POA: Diagnosis not present

## 2023-12-07 DIAGNOSIS — K219 Gastro-esophageal reflux disease without esophagitis: Secondary | ICD-10-CM | POA: Diagnosis present

## 2023-12-07 DIAGNOSIS — E1151 Type 2 diabetes mellitus with diabetic peripheral angiopathy without gangrene: Secondary | ICD-10-CM | POA: Diagnosis present

## 2023-12-07 DIAGNOSIS — Z933 Colostomy status: Secondary | ICD-10-CM | POA: Diagnosis not present

## 2023-12-07 DIAGNOSIS — Z87891 Personal history of nicotine dependence: Secondary | ICD-10-CM

## 2023-12-07 DIAGNOSIS — D649 Anemia, unspecified: Secondary | ICD-10-CM | POA: Diagnosis not present

## 2023-12-07 DIAGNOSIS — E7800 Pure hypercholesterolemia, unspecified: Secondary | ICD-10-CM | POA: Diagnosis present

## 2023-12-07 DIAGNOSIS — Z7984 Long term (current) use of oral hypoglycemic drugs: Secondary | ICD-10-CM

## 2023-12-07 DIAGNOSIS — Z9861 Coronary angioplasty status: Secondary | ICD-10-CM | POA: Diagnosis not present

## 2023-12-07 LAB — URINALYSIS, ROUTINE W REFLEX MICROSCOPIC
Bacteria, UA: NONE SEEN
Bilirubin Urine: NEGATIVE
Glucose, UA: >=500 mg/dL — AB
Hgb urine dipstick: NEGATIVE
Ketones, ur: NEGATIVE mg/dL
Leukocytes,Ua: NEGATIVE
Nitrite: NEGATIVE
Protein, ur: NEGATIVE mg/dL
Specific Gravity, Urine: 1.01 (ref 1.005–1.030)
pH: 7 (ref 5.0–8.0)

## 2023-12-07 LAB — CBC WITH DIFFERENTIAL/PLATELET
Abs Immature Granulocytes: 0.13 10*3/uL — ABNORMAL HIGH (ref 0.00–0.07)
Basophils Absolute: 0.1 10*3/uL (ref 0.0–0.1)
Basophils Relative: 1 %
Eosinophils Absolute: 0.1 10*3/uL (ref 0.0–0.5)
Eosinophils Relative: 0 %
HCT: 37.5 % — ABNORMAL LOW (ref 39.0–52.0)
Hemoglobin: 12 g/dL — ABNORMAL LOW (ref 13.0–17.0)
Immature Granulocytes: 1 %
Lymphocytes Relative: 11 %
Lymphs Abs: 1.3 10*3/uL (ref 0.7–4.0)
MCH: 25.9 pg — ABNORMAL LOW (ref 26.0–34.0)
MCHC: 32 g/dL (ref 30.0–36.0)
MCV: 81 fL (ref 80.0–100.0)
Monocytes Absolute: 1.1 10*3/uL — ABNORMAL HIGH (ref 0.1–1.0)
Monocytes Relative: 9 %
Neutro Abs: 9.3 10*3/uL — ABNORMAL HIGH (ref 1.7–7.7)
Neutrophils Relative %: 78 %
Platelets: 449 10*3/uL — ABNORMAL HIGH (ref 150–400)
RBC: 4.63 MIL/uL (ref 4.22–5.81)
RDW: 16 % — ABNORMAL HIGH (ref 11.5–15.5)
WBC: 11.9 10*3/uL — ABNORMAL HIGH (ref 4.0–10.5)
nRBC: 0 % (ref 0.0–0.2)

## 2023-12-07 LAB — COMPREHENSIVE METABOLIC PANEL WITH GFR
ALT: 11 U/L (ref 0–44)
AST: 12 U/L — ABNORMAL LOW (ref 15–41)
Albumin: 3.5 g/dL (ref 3.5–5.0)
Alkaline Phosphatase: 95 U/L (ref 38–126)
Anion gap: 13 (ref 5–15)
BUN: 38 mg/dL — ABNORMAL HIGH (ref 8–23)
CO2: 22 mmol/L (ref 22–32)
Calcium: 9.4 mg/dL (ref 8.9–10.3)
Chloride: 102 mmol/L (ref 98–111)
Creatinine, Ser: 2.52 mg/dL — ABNORMAL HIGH (ref 0.61–1.24)
GFR, Estimated: 26 mL/min — ABNORMAL LOW (ref >60)
Glucose, Bld: 112 mg/dL — ABNORMAL HIGH (ref 70–99)
Potassium: 3.8 mmol/L (ref 3.5–5.1)
Sodium: 137 mmol/L (ref 135–145)
Total Bilirubin: 0.6 mg/dL (ref 0.0–1.2)
Total Protein: 7.3 g/dL (ref 6.5–8.1)

## 2023-12-07 LAB — CK: Total CK: 45 U/L — ABNORMAL LOW (ref 49–397)

## 2023-12-07 LAB — PSA: Prostatic Specific Antigen: 3.89 ng/mL (ref 0.00–4.00)

## 2023-12-07 LAB — RESP PANEL BY RT-PCR (RSV, FLU A&B, COVID)  RVPGX2
Influenza A by PCR: NEGATIVE
Influenza B by PCR: NEGATIVE
Resp Syncytial Virus by PCR: NEGATIVE
SARS Coronavirus 2 by RT PCR: NEGATIVE

## 2023-12-07 LAB — GLUCOSE, CAPILLARY: Glucose-Capillary: 167 mg/dL — ABNORMAL HIGH (ref 70–99)

## 2023-12-07 LAB — LACTIC ACID, PLASMA: Lactic Acid, Venous: 1 mmol/L (ref 0.5–1.9)

## 2023-12-07 MED ORDER — VANCOMYCIN HCL IN DEXTROSE 1-5 GM/200ML-% IV SOLN
1000.0000 mg | Freq: Once | INTRAVENOUS | Status: AC
  Administered 2023-12-07: 1000 mg via INTRAVENOUS
  Filled 2023-12-07: qty 200

## 2023-12-07 MED ORDER — GADOBUTROL 1 MMOL/ML IV SOLN
6.0000 mL | Freq: Once | INTRAVENOUS | Status: AC | PRN
  Administered 2023-12-07: 6 mL via INTRAVENOUS

## 2023-12-07 MED ORDER — SODIUM CHLORIDE 0.9 % IV SOLN
500.0000 mg | INTRAVENOUS | Status: DC
  Administered 2023-12-07 – 2023-12-08 (×2): 500 mg via INTRAVENOUS
  Filled 2023-12-07 (×2): qty 5

## 2023-12-07 MED ORDER — CILOSTAZOL 100 MG PO TABS
100.0000 mg | ORAL_TABLET | Freq: Two times a day (BID) | ORAL | Status: DC
  Administered 2023-12-08 – 2023-12-10 (×5): 100 mg via ORAL
  Filled 2023-12-07 (×12): qty 1

## 2023-12-07 MED ORDER — METOPROLOL TARTRATE 25 MG PO TABS
25.0000 mg | ORAL_TABLET | Freq: Two times a day (BID) | ORAL | Status: DC
  Administered 2023-12-07 – 2023-12-10 (×6): 25 mg via ORAL
  Filled 2023-12-07 (×6): qty 1

## 2023-12-07 MED ORDER — MORPHINE SULFATE (PF) 4 MG/ML IV SOLN
4.0000 mg | Freq: Once | INTRAVENOUS | Status: AC
  Administered 2023-12-07: 4 mg via INTRAVENOUS
  Filled 2023-12-07: qty 1

## 2023-12-07 MED ORDER — HEPARIN SODIUM (PORCINE) 5000 UNIT/ML IJ SOLN
5000.0000 [IU] | Freq: Three times a day (TID) | INTRAMUSCULAR | Status: DC
  Administered 2023-12-08 – 2023-12-10 (×5): 5000 [IU] via SUBCUTANEOUS
  Filled 2023-12-07 (×5): qty 1

## 2023-12-07 MED ORDER — AMLODIPINE BESYLATE 5 MG PO TABS
10.0000 mg | ORAL_TABLET | Freq: Every morning | ORAL | Status: DC
  Administered 2023-12-08 – 2023-12-10 (×3): 10 mg via ORAL
  Filled 2023-12-07 (×3): qty 2

## 2023-12-07 MED ORDER — ATORVASTATIN CALCIUM 40 MG PO TABS
40.0000 mg | ORAL_TABLET | Freq: Every day | ORAL | Status: DC
  Administered 2023-12-07 – 2023-12-09 (×3): 40 mg via ORAL
  Filled 2023-12-07 (×3): qty 1

## 2023-12-07 MED ORDER — SODIUM CHLORIDE 0.9 % IV SOLN
2.0000 g | INTRAVENOUS | Status: DC
  Administered 2023-12-08 – 2023-12-10 (×3): 2 g via INTRAVENOUS
  Filled 2023-12-07 (×3): qty 20

## 2023-12-07 MED ORDER — VITAMIN B-12 1000 MCG PO TABS
1000.0000 ug | ORAL_TABLET | Freq: Every day | ORAL | Status: DC
  Administered 2023-12-08 – 2023-12-10 (×3): 1000 ug via ORAL
  Filled 2023-12-07 (×3): qty 1

## 2023-12-07 MED ORDER — ASPIRIN 81 MG PO TBEC
81.0000 mg | DELAYED_RELEASE_TABLET | Freq: Every day | ORAL | Status: DC
  Administered 2023-12-08 – 2023-12-10 (×3): 81 mg via ORAL
  Filled 2023-12-07 (×3): qty 1

## 2023-12-07 MED ORDER — HYDROMORPHONE HCL 1 MG/ML IJ SOLN
1.0000 mg | Freq: Once | INTRAMUSCULAR | Status: AC
  Administered 2023-12-07: 1 mg via INTRAVENOUS
  Filled 2023-12-07: qty 1

## 2023-12-07 MED ORDER — ENSURE ENLIVE PO LIQD
237.0000 mL | Freq: Two times a day (BID) | ORAL | Status: DC
  Administered 2023-12-08 – 2023-12-09 (×4): 237 mL via ORAL

## 2023-12-07 MED ORDER — ONDANSETRON HCL 4 MG/2ML IJ SOLN
4.0000 mg | Freq: Once | INTRAMUSCULAR | Status: AC
  Administered 2023-12-07: 4 mg via INTRAVENOUS
  Filled 2023-12-07: qty 2

## 2023-12-07 MED ORDER — HYDRALAZINE HCL 20 MG/ML IJ SOLN
10.0000 mg | Freq: Four times a day (QID) | INTRAMUSCULAR | Status: DC | PRN
  Administered 2023-12-07 – 2023-12-08 (×2): 10 mg via INTRAVENOUS
  Filled 2023-12-07 (×2): qty 1

## 2023-12-07 MED ORDER — LOSARTAN POTASSIUM 50 MG PO TABS
50.0000 mg | ORAL_TABLET | Freq: Every day | ORAL | Status: DC
  Administered 2023-12-07 – 2023-12-10 (×4): 50 mg via ORAL
  Filled 2023-12-07 (×4): qty 1
  Filled 2023-12-07: qty 2

## 2023-12-07 MED ORDER — LACTATED RINGERS IV BOLUS
1000.0000 mL | Freq: Once | INTRAVENOUS | Status: AC
  Administered 2023-12-07: 1000 mL via INTRAVENOUS

## 2023-12-07 MED ORDER — SODIUM CHLORIDE 0.9 % IV SOLN
2.0000 g | Freq: Once | INTRAVENOUS | Status: AC
  Administered 2023-12-07: 2 g via INTRAVENOUS
  Filled 2023-12-07: qty 12.5

## 2023-12-07 MED ORDER — GABAPENTIN 300 MG PO CAPS
600.0000 mg | ORAL_CAPSULE | Freq: Two times a day (BID) | ORAL | Status: DC
  Administered 2023-12-07 – 2023-12-10 (×6): 600 mg via ORAL
  Filled 2023-12-07 (×6): qty 2

## 2023-12-07 MED ORDER — INSULIN GLARGINE-YFGN 100 UNIT/ML ~~LOC~~ SOLN
10.0000 [IU] | Freq: Every day | SUBCUTANEOUS | Status: DC
  Administered 2023-12-07 – 2023-12-09 (×3): 10 [IU] via SUBCUTANEOUS
  Filled 2023-12-07 (×4): qty 0.1

## 2023-12-07 MED ORDER — PANTOPRAZOLE SODIUM 40 MG PO TBEC
40.0000 mg | DELAYED_RELEASE_TABLET | Freq: Two times a day (BID) | ORAL | Status: DC
  Administered 2023-12-08 – 2023-12-10 (×5): 40 mg via ORAL
  Filled 2023-12-07 (×5): qty 1

## 2023-12-07 NOTE — H&P (Signed)
 [facility] H&P   Patient Demographics:    [name], is a 78 y.o. male  MRN: [id number]   DOB - 06-Jun-1946  Admit Date - 12/07/2023  Outpatient Primary MD for the patient is [facility], [name]  Referring MD/NP/PA: Dr Scarlette Currier  Patient coming from: Home  Chief Complaint  Patient presents with   Weakness      HPI:    [name]  is a 78 y.o. male, past medical history of TIA, diabetes, CAD status post PCI CABG, recent hospitalization at [facility] in late February for pneumonia, presents ED secondary to complaint of generalized weakness, fatigue, and back pain, and is currently somnolent as he received IV pain medicine, but he able to wake up participate in the conversation, family assists with the history, patient has been with significant pain over the last 2 weeks, but it came unbearable 2 days ago which prompted him to come to ED. - ED workup significant for creatinine 2.5, at baseline, total CK at 45, white blood cell count at 11.9, hemoglobin at baseline of 12, respiratory panel is negative, initial CT chest without contrast with persistent pneumonia, and T9 pathologic compression fracture, so MRI thoracic lumbar spine was obtained which was significant for notable enhancing lesions, and ribs, thoracic, lumbar, sacrum and BL pelvic area reviewed and imaging, [facility] hospitalist consulted to admit.    Review of systems:      A full 10 point Review of Systems was done, except as stated above, all other Review of Systems were negative.   With Past History of the following :    Past Medical History:  Diagnosis Date   Anemia    CAD S/P percutaneous coronary angioplasty 1998   PCI TO CX   CKD (chronic kidney disease) stage 3, GFR 30-59 ml/min (HCC)    Diabetes mellitus without complication (HCC)    Diverticulitis 08/16/2017   hospitalized with diverticulitis/sepsis   GERD  (gastroesophageal reflux disease)    occassionally   Hx of CABG 10/2015   x 5   Hypercholesteremia    Hypertension    Neuropathy    Peripheral vascular disease (HCC)    s/p R-L FEM-FEM BYPASS   Pneumonia    Tachycardia    after CABG, pt on medicine for this      Past Surgical History:  Procedure Laterality Date   ABDOMINAL AORTOGRAM Bilateral 09/19/2016   Procedure: iliac;  Surgeon: Margherita Shell, MD;  [facility]: [facility];  Service: Cardiovascular;  Laterality: Bilateral;   ABDOMINAL AORTOGRAM W/LOWER EXTREMITY N/A 09/22/2021   Procedure: ABDOMINAL AORTOGRAM W/LOWER EXTREMITY;  Surgeon: Young Hensen, MD;  Location: [facility];  Service: Cardiovascular;  Laterality: N/A;   ABDOMINAL AORTOGRAM W/LOWER EXTREMITY N/A 10/05/2022   Procedure: ABDOMINAL AORTOGRAM W/LOWER EXTREMITY;  Surgeon: Young Hensen, MD;  [facility]: [facility];  Service: Cardiovascular;  Laterality:  N/A;   BIOPSY  05/16/2023   Procedure: BIOPSY;  Surgeon: Suzette Espy, MD;  Location: [facility];  Service: Endoscopy;;   CARDIAC CATHETERIZATION  2003   with stent   CARDIAC CATHETERIZATION N/A 10/11/2015   Procedure: Left Heart Cath and Coronary Angiography;  Surgeon: Peter M Swaziland, MD;  Location: [facility] INVASIVE CV LAB;  Service: Cardiovascular;  Laterality: N/A;   COLON RESECTION N/A 12/13/2017   Procedure: EXPLORATORY LAPAROTOMY, SIGMOID COLECTOMY WITH COLOSTOMY;  Surgeon: Dareen Ebbing, MD;  Location: [facility] OR;  Service: General;  Laterality: N/A;   COLONOSCOPY N/A 09/22/2013   Procedure: COLONOSCOPY;  Surgeon: Suzette Espy, MD;  [facility]: [facility];  Service: Endoscopy;  Laterality: N/A;  9:30 AM   COLONOSCOPY N/A 02/19/2018   Procedure: COLONOSCOPY;  Surgeon: Suzette Espy, MD;  [facility]: [facility];  Service: Endoscopy;  Laterality: N/A;  2:00pm   COLONOSCOPY WITH PROPOFOL  N/A 05/16/2023   Procedure: COLONOSCOPY WITH PROPOFOL ;  Surgeon: Suzette Espy, MD;   [facility];  Service: Endoscopy;  Laterality: N/A;  730am, asa 3   COLOSTOMY N/A 12/13/2017   Procedure: COLOSTOMY;  Surgeon: Dareen Ebbing, MD;  Location: [facility] OR;  Service: General;  Laterality: N/A;   COLOSTOMY REVERSAL N/A 03/26/2018   Procedure: COLOSTOMY REVERSAL;  Surgeon: Dareen Ebbing, MD;  Location: [facility] OR;  Service: General;  Laterality: N/A;   CORONARY ARTERY BYPASS GRAFT N/A 10/18/2015   Procedure: CORONARY ARTERY BYPASS GRAFTING (CABG) x  five, using left internal mammary artery and right leg greater saphenous vein harvested endoscopically;  Surgeon: Zelphia Higashi, MD;  Location: [facility] OR;  Service: Open Heart Surgery;  Laterality: N/A;   ENDARTERECTOMY FEMORAL Right 08/09/2016   Procedure: ENDARTERECTOMY FEMORAL WITH VEIN PATCH ANGIOPLASTY;  Surgeon: Margherita Shell, MD;  [facility]: [facility];  Service: Vascular;  Laterality: Right;   ESOPHAGOGASTRODUODENOSCOPY N/A 10/24/2017   Dr. Riley Cheadle: hiatal hernia   ESOPHAGOGASTRODUODENOSCOPY (EGD) WITH PROPOFOL  N/A 05/16/2023   Procedure: ESOPHAGOGASTRODUODENOSCOPY (EGD) WITH PROPOFOL ;  Surgeon: Suzette Espy, MD;  [facility];  Service: Endoscopy;  Laterality: N/A;   FEMORAL-FEMORAL BYPASS GRAFT Bilateral 08/09/2016   Procedure: REVISION BYPASS GRAFT RIGHT FEMORAL-LEFT FEMORAL ARTERY;  Surgeon: Margherita Shell, MD;  Location: [facility] OR;  Service: Vascular;  Laterality: Bilateral;   FEMORAL-POPLITEAL BYPASS GRAFT     INTRAMEDULLARY (IM) NAIL INTERTROCHANTERIC Left 04/03/2021   Procedure: LEFT  HIP INTRAMEDULLARY (IM) NAIL INTERTROCHANTRIC;  Surgeon: Arnie Lao, MD;  Location: [facility];  Service: Orthopedics;  Laterality: Left;   MALONEY DILATION N/A 05/16/2023   Procedure: Londa Rival DILATION;  Surgeon: Suzette Espy, MD;  [facility];  Service: Endoscopy;  Laterality: N/A;   PERIPHERAL VASCULAR BALLOON ANGIOPLASTY Right 09/22/2021   Procedure: PERIPHERAL VASCULAR BALLOON ANGIOPLASTY;  Surgeon:  Young Hensen, MD;  [facility];  Service: Cardiovascular;  Laterality: Right;   PERIPHERAL VASCULAR CATHETERIZATION N/A 05/08/2016   Procedure: Lower Extremity Angiography;  Surgeon: Avanell Leigh, MD;  Location: [facility] INVASIVE CV LAB;  Service: Cardiovascular;  Laterality: N/A;   PERIPHERAL VASCULAR INTERVENTION Right 09/19/2016   Procedure: Peripheral Vascular Intervention;  Surgeon: Margherita Shell, MD;  [facility];  Service: Cardiovascular;  Laterality: Right;  ext iliac stent   PERIPHERAL VASCULAR INTERVENTION Right 09/22/2021   Procedure: PERIPHERAL VASCULAR INTERVENTION;  Surgeon: Young Hensen, MD;  [facility];  Service: Cardiovascular;  Laterality: Right;   POLYPECTOMY  02/19/2018   Procedure: POLYPECTOMY;  Surgeon: Suzette Espy, MD;  [facility];  Service: Endoscopy;;   PROCTOSCOPY  10/24/2017   Procedure: PROCTOSCOPY;  Surgeon: Suzette Espy, MD;  [facility];  Service: Endoscopy;;   TEE WITHOUT CARDIOVERSION N/A 10/18/2015   Procedure: TRANSESOPHAGEAL ECHOCARDIOGRAM (TEE);  Surgeon: Zelphia Higashi, MD;  Location: [facility] OR;  Service: Open Heart Surgery;  Laterality: N/A;      Social History:     Social History   Tobacco Use   Smoking status: Former    Current packs/day: 0.00    Average packs/day: 2.0 packs/day for 30.0 years (60.0 ttl pk-yrs)    Types: Cigarettes    Start date: 08/14/1970    Quit date: 08/14/2000    Years since quitting: 23.3   Smokeless tobacco: Never  Substance Use Topics   Alcohol  use: No       Family History :     Family History  Problem Relation Age of Onset   Heart attack Mother    Stroke Mother    Colon cancer Brother        late 83s     Home Medications:   Prior to Admission medications   Medication Sig Start Date End Date Taking? Authorizing Provider  amLODipine  (NORVASC ) 10 MG tablet Take 10 mg by mouth in the morning.   Yes [provider]  aspirin  EC 81 MG tablet Take 1 tablet (81 mg total) by mouth daily with breakfast. 05/23/19  Yes Emokpae, Courage, MD  atorvastatin  (LIPITOR ) 40 MG tablet Take 1 tablet (40 mg total) by mouth daily at 6 PM. Patient taking differently: Take 40 mg by mouth at bedtime. 09/16/18  Yes Tat, Myrtie Atkinson, MD  baclofen (LIORESAL) 10 MG tablet Take 10 mg by mouth at bedtime. 12/05/23  Yes [provider]  cilostazol  (PLETAL ) 100 MG tablet Take 100 mg by mouth 2 (two) times daily.   Yes [provider]  clopidogrel  (PLAVIX ) 75 MG tablet Take 75 mg by mouth in the morning.   Yes [provider]  empagliflozin  (JARDIANCE ) 25 MG TABS tablet Take 12.5 mg by mouth in the morning.   Yes [provider]  gabapentin  (NEURONTIN ) 600 MG tablet Take 600 mg by mouth 2 (two) times daily.   Yes [provider]  insulin  glargine (LANTUS ) 100 UNIT/ML injection Inject 20 Units into the skin at bedtime.   Yes [provider]  losartan  (COZAAR ) 50 MG tablet Take 50 mg by mouth daily. 03/02/22  Yes [provider]  metFORMIN  (GLUCOPHAGE ) 1000 MG tablet Take 1,000 mg by mouth 2 (two) times daily with a meal.   Yes [provider]  metoprolol  tartrate (LOPRESSOR ) 50 MG tablet Take 25 mg by mouth 2 (two) times daily.    Yes [provider]  pantoprazole  (PROTONIX ) 40 MG tablet Take 1 tablet (40 mg total) by mouth 2 (two) times daily before a meal. Patient taking differently: Take 40 mg by mouth daily. 05/16/23  Yes Rourk, Windsor Hatcher, MD  vitamin B-12 (CYANOCOBALAMIN ) 1000 MCG tablet Take 1,000 mcg by mouth in the morning.   Yes [provider]     Allergies:     Allergies  Allergen Reactions   Acarbose Diarrhea     Physical Exam:   Vitals  Blood pressure (!) 175/68, pulse 90, temperature 97.6 F (36.4 C), temperature source Oral, resp. rate 14, height 5\' 6"  (1.676 m), weight 59 kg, SpO2 96%.  1. General Peering, frail,  deconditioned chronically appearing male, laying in bed  2.  On the left as he received IV Dilaudid  and morphine , but wakes up interact in the conversation  3. No F.N deficits, ALL C.Nerves Intact, Strength 5/5 all 4 extremities, Sensation intact all 4 extremities, Plantars down going.  4. Ears and Eyes appear Normal, Conjunctivae clear, PERRLA. Moist Oral Mucosa.  5. Supple Neck, No JVD, No cervical lymphadenopathy appriciated, No Carotid Bruits.  6. Symmetrical Chest wall movement, Good air movement bilaterally, CTAB.  7. RRR, No Gallops, Rubs or Murmurs, No Parasternal Heave.  8. Positive Bowel Sounds, Abdomen Soft, No tenderness, No organomegaly appriciated,No rebound -guarding or rigidity.  9.  No Cyanosis, Normal Skin Turgor, No Skin Rash or Bruise.  10. Good muscle tone,  joints appear normal , no effusions, Normal ROM.     Data Review:    CBC Recent Labs  Lab 12/07/23 1349  WBC 11.9*  HGB 12.0*  HCT 37.5*  PLT 449*  MCV 81.0  MCH 25.9*  MCHC 32.0  RDW 16.0*  LYMPHSABS 1.3  MONOABS 1.1*  EOSABS 0.1  BASOSABS 0.1   ------------------------------------------------------------------------------------------------------------------  Chemistries  Recent Labs  Lab 12/07/23 1349  NA 137  K 3.8  CL 102  CO2 22  GLUCOSE 112*  BUN 38*  CREATININE 2.52*  CALCIUM  9.4  AST 12*  ALT 11  ALKPHOS 95  BILITOT 0.6   ------------------------------------------------------------------------------------------------------------------ estimated creatinine clearance is 20.8 mL/min (A) (by C-G formula based on SCr of 2.52 mg/dL (H)). ------------------------------------------------------------------------------------------------------------------ No results for input(s): "TSH", "T4TOTAL", "T3FREE", "THYROIDAB" in the last 72 hours.  Invalid input(s): "FREET3"  Coagulation profile No results for input(s): "INR", "PROTIME" in the last 168  hours. ------------------------------------------------------------------------------------------------------------------- No results for input(s): "DDIMER" in the last 72 hours. -------------------------------------------------------------------------------------------------------------------  Cardiac Enzymes No results for input(s): "CKMB", "TROPONINI", "MYOGLOBIN" in the last 168 hours.  Invalid input(s): "CK" ------------------------------------------------------------------------------------------------------------------    Component Value Date/Time   BNP 92.0 03/26/2018 2030     ---------------------------------------------------------------------------------------------------------------  Urinalysis    Component Value Date/Time   COLORURINE STRAW (A) 12/07/2023 1337   APPEARANCEUR CLEAR 12/07/2023 1337   LABSPEC 1.010 12/07/2023 1337   PHURINE 7.0 12/07/2023 1337   GLUCOSEU >=500 (A) 12/07/2023 1337   HGBUR NEGATIVE 12/07/2023 1337   BILIRUBINUR NEGATIVE 12/07/2023 1337   KETONESUR NEGATIVE 12/07/2023 1337   PROTEINUR NEGATIVE 12/07/2023 1337   UROBILINOGEN 0.2 03/16/2015 0120   NITRITE NEGATIVE 12/07/2023 1337   LEUKOCYTESUR NEGATIVE 12/07/2023 1337    ----------------------------------------------------------------------------------------------------------------   Imaging Results:    MR THORACIC SPINE W WO CONTRAST Result Date: 12/07/2023 CLINICAL DATA:  Thoracic spine compression fracture. Back pain. Decreased appetite. EXAM: MRI THORACIC WITHOUT AND WITH CONTRAST TECHNIQUE: Multiplanar and multiecho pulse sequences of the thoracic spine were obtained without and with intravenous contrast. CONTRAST:  6mL GADAVIST  GADOBUTROL  1 MMOL/ML IV SOLN COMPARISON:  CT chest without contrast 12/07/2023. MRI of the thoracic spine without and with contrast 02/22/2021 FINDINGS: Alignment: No significant listhesis is present. Leftward curvature of the thoracic spine is centered at  T8-9. Vertebrae: Pathologic compression fracture is confirmed at T9-10. Abnormal enhancement extending extends into an expanded right pedicle. Prominent lesion across the inferior right lateral aspect of the T7 vertebral body measures 2.2 x 1.4 cm. Extensive enhancement encompasses the majority of the T9 vertebral body with some sparing on the left. 11 mm rounded enhancing lesion is present in the left T4. Multiple lesions are present at T2, the largest anteriorly on the  left along the inferior endplate measuring 11 mm. No other pathologic fractures are present. No significant extraosseous tumor is present. Multiple rib lesions are present bilaterally without a pathologic rib fracture. Cord: Central T2 hyperintensities noted in the cord at the T2-3 level. No other cord signal changes present. Cord morphology is within normal limits. Paraspinal and other soft tissues: The paraspinous soft tissues are within normal limits. The visualized lung fields are clear. No primary lesion is identified. Disc levels: No significant central disc protrusion or central canal stenosis is present. Facet hypertrophy contributes 2 right greater than left foraminal stenosis from T8-9 through T11-12. Moderate right foraminal stenosis is also present at T7-8. IMPRESSION: 1. Pathologic compression fracture at T9-10 with abnormal enhancement extending into an expanded right pedicle. 2. Multiple enhancing lesions throughout the thoracic spine as described compatible with metastatic disease or multiple myeloma. 3. Multiple rib lesions bilaterally without a pathologic rib fracture. 4. Central T2 hyperintensity in the cord at the T2-3 level is nonspecific but may represent a small focus of myelomalacia. 5. No significant central canal stenosis. 6. Facet hypertrophy contributes to right greater than left foraminal stenosis from T8-9 through T11-12. Moderate right foraminal stenosis is also present at T7-8. These results were called by telephone  at the time of interpretation on 12/07/2023 at 6:31 pm to provider JULIE HAVILAND , who verbally acknowledged these results. Electronically Signed   By: Audree Leas M.D.   On: 12/07/2023 18:31   MR Lumbar Spine W Wo Contrast Result Date: 12/07/2023 CLINICAL DATA:  Back pain and chills. EXAM: MRI LUMBAR SPINE WITHOUT AND WITH CONTRAST TECHNIQUE: Multiplanar and multiecho pulse sequences of the lumbar spine were obtained without and with intravenous contrast. CONTRAST:  6mL GADAVIST  GADOBUTROL  1 MMOL/ML IV SOLN COMPARISON:  CT of the abdomen and pelvis without contrast 04/13/2023 FINDINGS: Segmentation: 5 non rib-bearing lumbar type vertebral bodies are present. The lowest fully formed vertebral body is L5. Alignment: No significant listhesis is present. Rightward curvature of the lumbar spine is centered at L3. Vertebrae: Multiple enhancing lesions are present throughout the lumbar spine. Lesion anteriorly on the left subjacent to the superior endplate of L3 measures 3.1 x 2.6 x 1.7 cm. And a lesion inferiorly in anteriorly on the right measures 1.8 x 1.5 x 1.8 cm. No pathologic fractures are present. Multiple other smaller lesions are present throughout the lumbar spine, visualized sacrum and iliac bones. Conus medullaris and cauda equina: Conus extends to the L1 level. Conus and cauda equina appear normal. Paraspinal and other soft tissues: Simple cysts are present in the pole of both kidneys. The largest is on the right measuring 3 cm. Atherosclerotic changes are present in the aorta and branch vessels. Abnormal signal in the proximal left iliac artery raises concern for occlusion or slow flow. No significant adenopathy is present. No primary lesion is identified. Disc levels: L1-2: Mild disc bulging and facet hypertrophy is present. No significant stenosis is present. L2-3: A broad-based disc protrusion is asymmetric to the left. Mild facet hypertrophy is noted bilaterally. Mild left subarticular and  moderate left foraminal stenosis is present. L3-4: A broad-based disc protrusion is present. Moderate facet hypertrophy is worse on the right. This results in mild subarticular and moderate foraminal stenosis bilaterally, right greater than left. L4-5: A broad-based disc protrusion is present. Moderate facet hypertrophy is worse on the right. Moderate to severe central canal stenosis is present with right greater than left subarticular narrowing. Moderate foraminal stenosis is worse on the left. L5-S1:  A shallow right paramedian disc protrusion is present. This potentially contacts the traversing right L5 nerve roots. Facet spurring contributes to moderate right foraminal stenosis. IMPRESSION: 1. Multiple enhancing lesions throughout the lumbar spine, visualized sacrum and iliac bones compatible with metastatic disease or multiple myeloma. 2. No pathologic fractures. 3. Abnormal signal in the proximal left iliac artery raises concern for occlusion or slow flow. 4. 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 5. Mild left subarticular and moderate left foraminal stenosis at L2-3. 6. Mild subarticular and moderate foraminal stenosis bilaterally at L3-4, right greater than left. 7. Moderate to severe central canal stenosis at L4-5 with right greater than left subarticular narrowing. 8. Moderate foraminal stenosis bilaterally at L4-5 is worse on the left. 9. Shallow right paramedian disc protrusion at L5-S1 potentially contacts the traversing right L5 nerve roots. 10. Moderate right foraminal stenosis at L5-S1. These results were called by telephone at the time of interpretation on 12/07/2023 at 5:57 Pm to provider [facility], who verbally acknowledged these results. Electronically Signed   By: Audree Leas M.D.   On: 12/07/2023 18:17   CT Chest Wo Contrast Result Date: 12/07/2023 CLINICAL DATA:  Back pain, weakness, decreased appetite EXAM: CT CHEST WITHOUT CONTRAST TECHNIQUE: Multidetector CT  imaging of the chest was performed following the standard protocol without IV contrast. RADIATION DOSE REDUCTION: This exam was performed according to the departmental dose-optimization program which includes automated exposure control, adjustment of the mA and/or kV according to patient size and/or use of iterative reconstruction technique. COMPARISON:  12/07/2023, 08/28/2023 FINDINGS: Cardiovascular: Unenhanced imaging of the heart is unremarkable without pericardial effusion. 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 Dense atherosclerosis of the native coronary vasculature. Normal caliber of the thoracic aorta. Extensive atherosclerosis of the aorta and its branches. Assessment of the vascular lumen cannot be performed without IV contrast. Mediastinum/Nodes: No enlarged mediastinal or axillary lymph nodes. Thyroid  gland, trachea, and esophagus demonstrate no significant findings. Lungs/Pleura: Chronic elevation of the left hemidiaphragm, with mild left lower lobe bronchiectasis and scarring again noted. There are also linear opacities within the left upper lobe consistent with scarring seen previously. However, there is some new patchy nodular airspace disease within the bilateral upper lobes, left greater than right, concerning for superimposed pneumonia. No effusion or pneumothorax. Central airways are patent. Upper Abdomen: Cholelithiasis without evidence of acute cholecystitis. No other acute upper abdominal findings. Musculoskeletal: The mixed sclerotic and lucent lesion within the T9 vertebral body with associated right paraspinal soft tissue component is again identified, measuring 2.4 x 2.5 cm reference image 98/2. Since the previous exam, it appears that there is slight invagination of the superior endplate of the T9 vertebral body on the right, consistent with superimposed pathologic fracture. Less than 10% loss of height. No retropulsion. Further evaluation with MRI may be  useful given symptoms of back pain. There is a healing subacute to chronic right posterolateral seventh rib fracture with abundant callus formation, new since prior CT. No other acute bony abnormalities. Reconstructed images demonstrate no additional findings. IMPRESSION: 1. New fracture through the superior endplate of the T9 vertebral body at the site of the mixed sclerotic and lucent lesion seen on prior CT and MRI exams, consistent with pathologic fracture. Further evaluation with MRI may be useful. 2. Patchy bilateral upper lobe nodular airspace disease consistent with inflammatory or infectious etiology. 3. Chronic areas of scarring or atelectasis within the left upper and left lower lobes, with stable elevation of the left hemidiaphragm. 4.  Aortic Atherosclerosis (ICD10-I70.0). Electronically Signed   By: Bobbye Burrow M.D.   On: 12/07/2023 16:39   DG Chest Portable 1 View Result Date: 12/07/2023 CLINICAL DATA:  Cough, back pain EXAM: PORTABLE CHEST - 1 VIEW COMPARISON:  08/09/2023 FINDINGS: Low lung volumes. Chronic linear scarring or atelectasis laterally in the left mid lung. No new infiltrate or overt edema. Heart size and mediastinal contours are within normal limits. Aortic Atherosclerosis (ICD10-170.0). CABG markers. No effusion. Sternotomy wires. IMPRESSION: Low volumes with chronic left mid lung scarring or atelectasis. Electronically Signed   By: Nicoletta Barrier M.D.   On: 12/07/2023 13:29    EKG:  Vent. rate 66 BPM PR interval 211 ms QRS duration 140 ms QT/QTcB 447/469 ms P-R-T axes 39 34 38 Sinus rhythm Nonspecific intraventricular conduction delay  Assessment & Plan:    Principal Problem:   Pneumonia Active Problems:   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   PVD (peripheral vascular disease) (HCC)   HLD (hyperlipidemia)   CKD (chronic kidney disease) stage 2, GFR 60-89 ml/min   Type 2 diabetes mellitus with diabetic neuropathy (HCC)   CAD S/P percutaneous coronary  angioplasty    HCAP - Patient with recent hospitalization at the [facility] for of pneumonia, he still presents with weakness, cough, imaging still significant for persistent pneumonia, he will be admitted the pneumonia pathway, will check Legionella, strep pneumonia antigen, will send sputum cultures, he received IV vancomycin  and cefepime  in ED, I will continue with Rocephin  and azithromycin  and then antibiotics if he lacks response to treatment.  Lytic bone lesion T9 pathologic compression fracture -Patient presents significant back pain over the last few days, initial imaging concerning for T9 pathologic compression fracture, MRI thoracic spine does show multiple metastatic bone lesions in thoracic, lumbar, sacrum and iliac spine, as well as in the ribs. - Understandably this is very devastating news for patient and family, had discussion with them to explain these findings. - I have explained for them neck step is pain control, PT and OT. - Multiple myeloma workup has been sent, will send urine protein electrophoresis, and PSA as well - Will place consult for oncology as family understandably coarse and with a lot of questions  hypertension - Uncontrolled, most likely due to pain, not taking his medicine today, resume home regimen and will add as needed hydralazine   Hyperlipidemia - Continue with home dose statin  CAD PVD - Sinew with aspirin  and Pletal , hold Plavix  if procedures are needed. - Continue with statin  GERD - Continue with PPI  Diabetes mellitus, type II, insulin -dependent - Continue with Lantus  at a lower dose, hold Jardiance , add insulin  sliding scale  CKD stage IV - At baseline, avoid nephrotoxic medications  Interstitial lung disease COPD - Sinew with as needed nebulizer treatment  SVT - Appears stable, continue with home dose Toprol -XL  Diabetic neuropathy - Continue with gabapentin   DVT Prophylaxis Heparin  AM Labs Ordered, also please review Full  Orders  Family Communication: Admission, patients condition and plan of care including tests being ordered have been discussed with the patient and wife and daughter who indicate understanding and agree with the plan and Code Status.  Code Status full code  Likely DC to home  Consults called: Oncology consult requested in epic  Admission status: Inpatient  Time spent in minutes : 70 minutes   Seena Dadds M.D on 12/07/2023 at 8:11 PM   [facility] - Office  [phone]

## 2023-12-07 NOTE — ED Provider Notes (Signed)
 Pt signed out by Dr. Adan Holms pending CT chest and MRI lumbar spine.  Films reviewed by me.  I agree with the radiologist.  CT chest:   New fracture through the superior endplate of the T9 vertebral  body at the site of the mixed sclerotic and lucent lesion seen on  prior CT and MRI exams, consistent with pathologic fracture. Further  evaluation with MRI may be useful.  2. Patchy bilateral upper lobe nodular airspace disease consistent  with inflammatory or infectious etiology.  3. Chronic areas of scarring or atelectasis within the left upper  and left lower lobes, with stable elevation of the left  hemidiaphragm.  4.  Aortic Atherosclerosis (ICD10-I70.0).    Pt started on vanc and cefepime  as he's recently been admitted with pna.  Blood cx and lactic acid ordered.  Lactic nl.  MRI thoracic spine added on due to this t9 fx.  MRI lumbar:  . Multiple enhancing lesions throughout the lumbar spine,  visualized sacrum and iliac bones compatible with metastatic disease  or multiple myeloma.  2. No pathologic fractures.  3. Abnormal signal in the proximal left iliac artery raises concern  for occlusion or slow flow.  4. 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  5. Mild left subarticular and moderate left foraminal stenosis at  L2-3.  6. Mild subarticular and moderate foraminal stenosis bilaterally at  L3-4, right greater than left.  7. Moderate to severe central canal stenosis at L4-5 with right  greater than left subarticular narrowing.  8. Moderate foraminal stenosis bilaterally at L4-5 is worse on the  left.  9. Shallow right paramedian disc protrusion at L5-S1 potentially  contacts the traversing right L5 nerve roots.  10. Moderate right foraminal stenosis at L5-S1.   MRI thoracic: . Pathologic compression fracture at T9-10 with abnormal  enhancement extending into an expanded right pedicle.  2. Multiple enhancing lesions throughout the thoracic spine as   described compatible with metastatic disease or multiple myeloma.  3. Multiple rib lesions bilaterally without a pathologic rib  fracture.  4. Central T2 hyperintensity in the cord at the T2-3 level is  nonspecific but may represent a small focus of myelomalacia.  5. No significant central canal stenosis.  6. Facet hypertrophy contributes to right greater than left  foraminal stenosis from T8-9 through T11-12. Moderate right  foraminal stenosis is also present at T7-8.   Pt d/w Dr. Osborne Blazer ([facility]) for admission.   Sueellen Emery, MD 12/07/23 [phone]

## 2023-12-07 NOTE — ED Notes (Signed)
 Patient transported to MRI

## 2023-12-07 NOTE — ED Provider Notes (Signed)
 [place] EMERGENCY DEPARTMENT AT [facility] Provider Note   CSN: [id number] Arrival date & time: 12/07/23  1146     History  Chief Complaint  Patient presents with   Weakness    [name] is a 78 y.o. male.  78 year old male with history of stroke, diabetes, and CAD status post PCI/CABG who presents emergency department with fatigue.  History obtained per patient, his wife, and his daughter.  Reports that over the past 2 weeks he has been having atraumatic lower back pain.  Says that since yesterday has been feeling weak and sick.  No fevers that they are aware of.  No chills.  No dysuria or frequency.  Says that his legs are feeling weak but is unsure if this is from generalized weakness.  No bowel or bladder incontinence.  Has had a dry cough and typically will feel this way before he gets a pneumonia.  No history of indwelling catheters or IV drug use.  Was recently admitted on 10/06/2023 for pneumonia.       Home Medications Prior to Admission medications   Medication Sig Start Date End Date Taking? Authorizing Provider  amLODipine  (NORVASC ) 10 MG tablet Take 10 mg by mouth in the morning.   Yes [provider]  aspirin  EC 81 MG tablet Take 1 tablet (81 mg total) by mouth daily with breakfast. 05/23/19  Yes Emokpae, Courage, MD  atorvastatin  (LIPITOR ) 40 MG tablet Take 1 tablet (40 mg total) by mouth daily at 6 PM. Patient taking differently: Take 40 mg by mouth at bedtime. 09/16/18  Yes Tat, Myrtie Atkinson, MD  baclofen (LIORESAL) 10 MG tablet Take 10 mg by mouth at bedtime. 12/05/23  Yes [provider]  cilostazol  (PLETAL ) 100 MG tablet Take 100 mg by mouth 2 (two) times daily.   Yes [provider]  clopidogrel  (PLAVIX ) 75 MG tablet Take 75 mg by mouth in the morning.   Yes [provider]  empagliflozin  (JARDIANCE ) 25 MG TABS tablet Take 12.5 mg by mouth in the morning.   Yes [provider]  gabapentin  (NEURONTIN ) 600 MG  tablet Take 600 mg by mouth 2 (two) times daily.   Yes [provider]  insulin  glargine (LANTUS ) 100 UNIT/ML injection Inject 20 Units into the skin at bedtime.   Yes [provider]  losartan  (COZAAR ) 50 MG tablet Take 50 mg by mouth daily. 03/02/22  Yes [provider]  metFORMIN  (GLUCOPHAGE ) 1000 MG tablet Take 1,000 mg by mouth 2 (two) times daily with a meal.   Yes [provider]  metoprolol  tartrate (LOPRESSOR ) 50 MG tablet Take 25 mg by mouth 2 (two) times daily.    Yes [provider]  pantoprazole  (PROTONIX ) 40 MG tablet Take 1 tablet (40 mg total) by mouth 2 (two) times daily before a meal. Patient taking differently: Take 40 mg by mouth daily. 05/16/23  Yes Rourk, Windsor Hatcher, MD  vitamin B-12 (CYANOCOBALAMIN ) 1000 MCG tablet Take 1,000 mcg by mouth in the morning.   Yes [provider]      Allergies    Acarbose    Review of Systems   Review of Systems  Physical Exam Updated Vital Signs BP (!) 181/71 (BP Location: Right Arm)   Pulse 92   Temp 97.7 F (36.5 C) (Oral)   Resp 14   Ht 5\' 7"  (1.702 m)   Wt 61.6 kg   SpO2 93%   BMI 21.27 kg/m  Physical Exam Vitals and  nursing note reviewed.  Constitutional:      General: He is not in acute distress.    Appearance: He is well-developed.  HENT:     Head: Normocephalic and atraumatic.     Right Ear: External ear normal.     Left Ear: External ear normal.     Nose: Nose normal.  Eyes:     Extraocular Movements: Extraocular movements intact.     Conjunctiva/sclera: Conjunctivae normal.     Pupils: Pupils are equal, round, and reactive to light.  Cardiovascular:     Rate and Rhythm: Normal rate and regular rhythm.     Heart sounds: Normal heart sounds.  Pulmonary:     Effort: Pulmonary effort is normal. No respiratory distress.     Breath sounds: Normal breath sounds.  Musculoskeletal:     Cervical back: Normal range of motion and neck supple.     Right lower leg: No  edema.     Left lower leg: No edema.     Comments: No spinal midline TTP in cervical or thoracic spine.  Tenderness to palpation in lower lumbar spine.  No stepoffs noted.   Motor: Muscle bulk and tone are normal. Strength is 5/5 in hip flexion, knee flexion and extension, ankle dorsiflexion and plantar flexion bilaterally. Full strength of great toe dorsiflexion bilaterally.  Sensory: Intact sensation to light touch in L2 though S1 dermatomes bilaterally.   Reflexes: Patellar 2+ bilaterally, Achilles 2+ bilaterally, no ankle clonus bilaterally  Skin:    General: Skin is warm and dry.  Neurological:     Mental Status: He is alert. Mental status is at baseline.  Psychiatric:        Mood and Affect: Mood normal.        Behavior: Behavior normal.     ED Results / Procedures / Treatments   Labs (all labs ordered are listed, but only abnormal results are displayed) Labs Reviewed  CBC WITH DIFFERENTIAL/PLATELET - Abnormal; Notable for the following components:      Result Value   WBC 11.9 (*)    Hemoglobin 12.0 (*)    HCT 37.5 (*)    MCH 25.9 (*)    RDW 16.0 (*)    Platelets 449 (*)    Neutro Abs 9.3 (*)    Monocytes Absolute 1.1 (*)    Abs Immature Granulocytes 0.13 (*)    All other components within normal limits  COMPREHENSIVE METABOLIC PANEL WITH GFR - Abnormal; Notable for the following components:   Glucose, Bld 112 (*)    BUN 38 (*)    Creatinine, Ser 2.52 (*)    AST 12 (*)    GFR, Estimated 26 (*)    All other components within normal limits  URINALYSIS, ROUTINE W REFLEX MICROSCOPIC - Abnormal; Notable for the following components:   Color, Urine STRAW (*)    Glucose, UA >=500 (*)    All other components within normal limits  CK - Abnormal; Notable for the following components:   Total CK 45 (*)    All other components within normal limits  GLUCOSE, CAPILLARY - Abnormal; Notable for the following components:   Glucose-Capillary 167 (*)    All other components  within normal limits  RESP PANEL BY RT-PCR (RSV, FLU A&B, COVID)  RVPGX2  CULTURE, BLOOD (ROUTINE X 2)  CULTURE, BLOOD (ROUTINE X 2)  EXPECTORATED SPUTUM ASSESSMENT W GRAM STAIN, RFLX TO RESP C  LACTIC ACID, PLASMA  MULTIPLE MYELOMA PANEL, SERUM  PSA  BASIC  METABOLIC PANEL WITH GFR  CBC  PROTEIN ELECTRO, RANDOM URINE  STREP PNEUMONIAE URINARY ANTIGEN  LEGIONELLA PNEUMOPHILA SEROGP 1 UR AG    EKG EKG Interpretation Date/Time:  Friday December 07 2023 12:08:22 EDT Ventricular Rate:  66 PR Interval:  211 QRS Duration:  140 QT Interval:  447 QTC Calculation: 469 R Axis:   34  Text Interpretation: Sinus rhythm Nonspecific intraventricular conduction delay No significant change since last tracing Confirmed by Sueellen Emery [phone]) on 12/07/2023 3:45:46 PM  Radiology MR THORACIC SPINE W WO CONTRAST Result Date: 12/07/2023 CLINICAL DATA:  Thoracic spine compression fracture. Back pain. Decreased appetite. EXAM: MRI THORACIC WITHOUT AND WITH CONTRAST TECHNIQUE: Multiplanar and multiecho pulse sequences of the thoracic spine were obtained without and with intravenous contrast. CONTRAST:  6mL GADAVIST  GADOBUTROL  1 MMOL/ML IV SOLN COMPARISON:  CT chest without contrast 12/07/2023. MRI of the thoracic spine without and with contrast 02/22/2021 FINDINGS: Alignment: No significant listhesis is present. Leftward curvature of the thoracic spine is centered at T8-9. Vertebrae: Pathologic compression fracture is confirmed at T9-10. Abnormal enhancement extending extends into an expanded right pedicle. Prominent lesion across the inferior right lateral aspect of the T7 vertebral body measures 2.2 x 1.4 cm. Extensive enhancement encompasses the majority of the T9 vertebral body with some sparing on the left. 11 mm rounded enhancing lesion is present in the left T4. Multiple lesions are present at T2, the largest anteriorly on the left along the inferior endplate measuring 11 mm. No other pathologic fractures  are present. No significant extraosseous tumor is present. Multiple rib lesions are present bilaterally without a pathologic rib fracture. Cord: Central T2 hyperintensities noted in the cord at the T2-3 level. No other cord signal changes present. Cord morphology is within normal limits. Paraspinal and other soft tissues: The paraspinous soft tissues are within normal limits. The visualized lung fields are clear. No primary lesion is identified. Disc levels: No significant central disc protrusion or central canal stenosis is present. Facet hypertrophy contributes 2 right greater than left foraminal stenosis from T8-9 through T11-12. Moderate right foraminal stenosis is also present at T7-8. IMPRESSION: 1. Pathologic compression fracture at T9-10 with abnormal enhancement extending into an expanded right pedicle. 2. Multiple enhancing lesions throughout the thoracic spine as described compatible with metastatic disease or multiple myeloma. 3. Multiple rib lesions bilaterally without a pathologic rib fracture. 4. Central T2 hyperintensity in the cord at the T2-3 level is nonspecific but may represent a small focus of myelomalacia. 5. No significant central canal stenosis. 6. Facet hypertrophy contributes to right greater than left foraminal stenosis from T8-9 through T11-12. Moderate right foraminal stenosis is also present at T7-8. These results were called by telephone at the time of interpretation on 12/07/2023 at 6:31 pm to provider JULIE HAVILAND , who verbally acknowledged these results. Electronically Signed   By: Audree Leas M.D.   On: 12/07/2023 18:31   MR Lumbar Spine W Wo Contrast Result Date: 12/07/2023 CLINICAL DATA:  Back pain and chills. EXAM: MRI LUMBAR SPINE WITHOUT AND WITH CONTRAST TECHNIQUE: Multiplanar and multiecho pulse sequences of the lumbar spine were obtained without and with intravenous contrast. CONTRAST:  6mL GADAVIST  GADOBUTROL  1 MMOL/ML IV SOLN COMPARISON:  CT of the abdomen  and pelvis without contrast 04/13/2023 FINDINGS: Segmentation: 5 non rib-bearing lumbar type vertebral bodies are present. The lowest fully formed vertebral body is L5. Alignment: No significant listhesis is present. Rightward curvature of the lumbar spine is centered at L3. Vertebrae: Multiple enhancing lesions  are present throughout the lumbar spine. Lesion anteriorly on the left subjacent to the superior endplate of L3 measures 3.1 x 2.6 x 1.7 cm. And a lesion inferiorly in anteriorly on the right measures 1.8 x 1.5 x 1.8 cm. No pathologic fractures are present. Multiple other smaller lesions are present throughout the lumbar spine, visualized sacrum and iliac bones. Conus medullaris and cauda equina: Conus extends to the L1 level. Conus and cauda equina appear normal. Paraspinal and other soft tissues: Simple cysts are present in the pole of both kidneys. The largest is on the right measuring 3 cm. Atherosclerotic changes are present in the aorta and branch vessels. Abnormal signal in the proximal left iliac artery raises concern for occlusion or slow flow. No significant adenopathy is present. No primary lesion is identified. Disc levels: L1-2: Mild disc bulging and facet hypertrophy is present. No significant stenosis is present. L2-3: A broad-based disc protrusion is asymmetric to the left. Mild facet hypertrophy is noted bilaterally. Mild left subarticular and moderate left foraminal stenosis is present. L3-4: A broad-based disc protrusion is present. Moderate facet hypertrophy is worse on the right. This results in mild subarticular and moderate foraminal stenosis bilaterally, right greater than left. L4-5: A broad-based disc protrusion is present. Moderate facet hypertrophy is worse on the right. Moderate to severe central canal stenosis is present with right greater than left subarticular narrowing. Moderate foraminal stenosis is worse on the left. L5-S1: A shallow right paramedian disc protrusion is  present. This potentially contacts the traversing right L5 nerve roots. Facet spurring contributes to moderate right foraminal stenosis. IMPRESSION: 1. Multiple enhancing lesions throughout the lumbar spine, visualized sacrum and iliac bones compatible with metastatic disease or multiple myeloma. 2. No pathologic fractures. 3. Abnormal signal in the proximal left iliac artery raises concern for occlusion or slow flow. 4. 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 5. Mild left subarticular and moderate left foraminal stenosis at L2-3. 6. Mild subarticular and moderate foraminal stenosis bilaterally at L3-4, right greater than left. 7. Moderate to severe central canal stenosis at L4-5 with right greater than left subarticular narrowing. 8. Moderate foraminal stenosis bilaterally at L4-5 is worse on the left. 9. Shallow right paramedian disc protrusion at L5-S1 potentially contacts the traversing right L5 nerve roots. 10. Moderate right foraminal stenosis at L5-S1. These results were called by telephone at the time of interpretation on 12/07/2023 at 5:57 Pm to provider [facility], who verbally acknowledged these results. Electronically Signed   By: Audree Leas M.D.   On: 12/07/2023 18:17   CT Chest Wo Contrast Result Date: 12/07/2023 CLINICAL DATA:  Back pain, weakness, decreased appetite EXAM: CT CHEST WITHOUT CONTRAST TECHNIQUE: Multidetector CT imaging of the chest was performed following the standard protocol without IV contrast. RADIATION DOSE REDUCTION: This exam was performed according to the departmental dose-optimization program which includes automated exposure control, adjustment of the mA and/or kV according to patient size and/or use of iterative reconstruction technique. COMPARISON:  12/07/2023, 08/28/2023 FINDINGS: Cardiovascular: Unenhanced imaging of the heart is unremarkable without pericardial effusion. 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 Dense  atherosclerosis of the native coronary vasculature. Normal caliber of the thoracic aorta. Extensive atherosclerosis of the aorta and its branches. Assessment of the vascular lumen cannot be performed without IV contrast. Mediastinum/Nodes: No enlarged mediastinal or axillary lymph nodes. Thyroid gland, trachea, and esophagus demonstrate no significant findings. Lungs/Pleura: Chronic elevation of the left hemidiaphragm, with mild left lower lobe bronchiectasis and scarring again noted.  There are also linear opacities within the left upper lobe consistent with scarring seen previously. However, there is some new patchy nodular airspace disease within the bilateral upper lobes, left greater than right, concerning for superimposed pneumonia. No effusion or pneumothorax. Central airways are patent. Upper Abdomen: Cholelithiasis without evidence of acute cholecystitis. No other acute upper abdominal findings. Musculoskeletal: The mixed sclerotic and lucent lesion within the T9 vertebral body with associated right paraspinal soft tissue component is again identified, measuring 2.4 x 2.5 cm reference image 98/2. Since the previous exam, it appears that there is slight invagination of the superior endplate of the T9 vertebral body on the right, consistent with superimposed pathologic fracture. Less than 10% loss of height. No retropulsion. Further evaluation with MRI may be useful given symptoms of back pain. There is a healing subacute to chronic right posterolateral seventh rib fracture with abundant callus formation, new since prior CT. No other acute bony abnormalities. Reconstructed images demonstrate no additional findings. IMPRESSION: 1. New fracture through the superior endplate of the T9 vertebral body at the site of the mixed sclerotic and lucent lesion seen on prior CT and MRI exams, consistent with pathologic fracture. Further evaluation with MRI may be useful. 2. Patchy bilateral upper lobe nodular airspace  disease consistent with inflammatory or infectious etiology. 3. Chronic areas of scarring or atelectasis within the left upper and left lower lobes, with stable elevation of the left hemidiaphragm. 4.  Aortic Atherosclerosis (ICD10-I70.0). Electronically Signed   By: Bobbye Burrow M.D.   On: 12/07/2023 16:39   DG Chest Portable 1 View Result Date: 12/07/2023 CLINICAL DATA:  Cough, back pain EXAM: PORTABLE CHEST - 1 VIEW COMPARISON:  08/09/2023 FINDINGS: Low lung volumes. Chronic linear scarring or atelectasis laterally in the left mid lung. No new infiltrate or overt edema. Heart size and mediastinal contours are within normal limits. Aortic Atherosclerosis (ICD10-170.0). CABG markers. No effusion. Sternotomy wires. IMPRESSION: Low volumes with chronic left mid lung scarring or atelectasis. Electronically Signed   By: Nicoletta Barrier M.D.   On: 12/07/2023 13:29    Procedures Procedures    Medications Ordered in ED Medications  amLODipine  (NORVASC ) tablet 10 mg (has no administration in time range)  losartan  (COZAAR ) tablet 50 mg (has no administration in time range)  metoprolol  tartrate (LOPRESSOR ) tablet 25 mg (has no administration in time range)  hydrALAZINE  (APRESOLINE ) injection 10 mg (10 mg Intravenous Given 12/07/23 1929)  aspirin  EC tablet 81 mg (has no administration in time range)  atorvastatin  (LIPITOR ) tablet 40 mg (has no administration in time range)  insulin  glargine-yfgn (SEMGLEE ) injection 10 Units (has no administration in time range)  pantoprazole  (PROTONIX ) EC tablet 40 mg (has no administration in time range)  cilostazol  (PLETAL ) tablet 100 mg (has no administration in time range)  cyanocobalamin  (VITAMIN B12) tablet 1,000 mcg (has no administration in time range)  gabapentin  (NEURONTIN ) capsule 600 mg (has no administration in time range)  heparin  injection 5,000 Units (has no administration in time range)  cefTRIAXone  (ROCEPHIN ) 2 g in sodium chloride  0.9 % 100 mL IVPB (has  no administration in time range)  azithromycin  (ZITHROMAX ) 500 mg in sodium chloride  0.9 % 250 mL IVPB (has no administration in time range)  lactated ringers  bolus 1,000 mL (0 mLs Intravenous Stopped 12/07/23 1739)  morphine  (PF) 4 MG/ML injection 4 mg (4 mg Intravenous Given 12/07/23 1619)  ondansetron  (ZOFRAN ) injection 4 mg (4 mg Intravenous Given 12/07/23 1618)  vancomycin  (VANCOCIN ) IVPB 1000 mg/200 mL premix (0  mg Intravenous Stopped 12/07/23 1910)  ceFEPIme  (MAXIPIME ) 2 g in sodium chloride  0.9 % 100 mL IVPB (0 g Intravenous Stopped 12/07/23 1844)  gadobutrol  (GADAVIST ) 1 MMOL/ML injection 6 mL (6 mLs Intravenous Contrast Given 12/07/23 1743)  HYDROmorphone  (DILAUDID ) injection 1 mg (1 mg Intravenous Given 12/07/23 1843)    ED Course/ Medical Decision Making/ A&P Clinical Course as of 12/07/23 2048  Fri Dec 07, 2023  1537 Creatinine(!): 2.52 At basline [RP]  1543 Signed out to Dr Scarlette Currier [RP]    Clinical Course User Index [RP] Ninetta Basket, MD                                 Medical Decision Making Amount and/or Complexity of Data Reviewed Labs: ordered. Decision-making details documented in ED Course. Radiology: ordered.  Risk Prescription drug management. Decision regarding hospitalization.   [name] is a 78 y.o. male with comorbidities that complicate the patient evaluation including stroke, diabetes, and CAD status post PCI/CABG who presents emergency department with fatigue.   Initial Ddx:  Pneumonia, URI, UTI, anemia, electrolyte abnormality, renal failure, spinal cord compression, spinal epidural abscess  MDM/Course:  Patient presents emergency department with generalized fatigue and prodromal type symptoms.  Family says he usually gets this way before he is diagnosed with a pneumonia.  Does have a mild cough but no fevers at home.  Was recently hospitalized for pneumonia.  Only other symptom at this time is back pain.  On history is somewhat difficult to  tease out if he is having true neurologic weakness of his legs or if he is weak from generalized weakness.  On exam does not appear to have any weakness of the legs.  X-ray without pneumonia.  COVID and flu are negative.  Urinalysis without evidence of UTI.  Patient ordered for CT scan of the chest to evaluate for occult pneumonia since he may need broad-spectrum antibiotics.  Also will obtain MRI of the lumbar spine where is having pain to ensure that he has not have any infection or other abnormality that would be causing his symptoms.  Signed out to the oncoming physician awaiting imaging results.   This patient presents to the ED for concern of complaints listed in HPI, this involves an extensive number of treatment options, and is a complaint that carries with it a high risk of complications and morbidity. Disposition including potential need for admission considered.   Dispo: Admit to Floor  Additional history obtained from spouse Records reviewed Outpatient Clinic Notes The following labs were independently interpreted: Chemistry and show CKD I independently reviewed the following imaging with scope of interpretation limited to determining acute life threatening conditions related to emergency care: Chest x-ray and agree with the radiologist interpretation with the following exceptions: none I personally reviewed and interpreted cardiac monitoring: normal sinus rhythm  I personally reviewed and interpreted the pt's EKG: see above for interpretation  I have reviewed the patients home medications and made adjustments as needed Social Determinants of health:  Geriatric  Portions of this note were generated with Scientist, clinical (histocompatibility and immunogenetics). Dictation errors may occur despite best attempts at proofreading.     Final Clinical Impression(s) / ED Diagnoses Final diagnoses:  Pneumonia of both upper lobes due to infectious organism  Metastatic cancer to bone (HCC)  Weakness  Lytic bone lesions on  xray  Pathological fracture of vertebra due to neoplastic disease, initial encounter  Spinal stenosis  of thoracolumbar region    Rx / DC Orders ED Discharge Orders     None         Ninetta Basket, MD 12/07/23 2048

## 2023-12-07 NOTE — ED Triage Notes (Signed)
 Pt's wife stated that pt has been to the doc for back pain but is now unable to get out of bed since yesterday. Pt's wife stated he has no appetite either and has not been able to take any medications.

## 2023-12-08 DIAGNOSIS — M898X9 Other specified disorders of bone, unspecified site: Secondary | ICD-10-CM | POA: Diagnosis present

## 2023-12-08 DIAGNOSIS — Z9861 Coronary angioplasty status: Secondary | ICD-10-CM

## 2023-12-08 DIAGNOSIS — Z951 Presence of aortocoronary bypass graft: Secondary | ICD-10-CM

## 2023-12-08 DIAGNOSIS — E1140 Type 2 diabetes mellitus with diabetic neuropathy, unspecified: Secondary | ICD-10-CM

## 2023-12-08 DIAGNOSIS — E782 Mixed hyperlipidemia: Secondary | ICD-10-CM

## 2023-12-08 DIAGNOSIS — N182 Chronic kidney disease, stage 2 (mild): Secondary | ICD-10-CM

## 2023-12-08 DIAGNOSIS — K219 Gastro-esophageal reflux disease without esophagitis: Secondary | ICD-10-CM

## 2023-12-08 DIAGNOSIS — I2510 Atherosclerotic heart disease of native coronary artery without angina pectoris: Secondary | ICD-10-CM

## 2023-12-08 DIAGNOSIS — Z794 Long term (current) use of insulin: Secondary | ICD-10-CM

## 2023-12-08 DIAGNOSIS — J189 Pneumonia, unspecified organism: Secondary | ICD-10-CM | POA: Diagnosis not present

## 2023-12-08 LAB — BASIC METABOLIC PANEL WITH GFR
Anion gap: 12 (ref 5–15)
BUN: 39 mg/dL — ABNORMAL HIGH (ref 8–23)
CO2: 23 mmol/L (ref 22–32)
Calcium: 8.8 mg/dL — ABNORMAL LOW (ref 8.9–10.3)
Chloride: 102 mmol/L (ref 98–111)
Creatinine, Ser: 2.3 mg/dL — ABNORMAL HIGH (ref 0.61–1.24)
GFR, Estimated: 29 mL/min — ABNORMAL LOW (ref >60)
Glucose, Bld: 147 mg/dL — ABNORMAL HIGH (ref 70–99)
Potassium: 4.6 mmol/L (ref 3.5–5.1)
Sodium: 137 mmol/L (ref 135–145)

## 2023-12-08 LAB — CBC
HCT: 34.4 % — ABNORMAL LOW (ref 39.0–52.0)
Hemoglobin: 11.1 g/dL — ABNORMAL LOW (ref 13.0–17.0)
MCH: 26.3 pg (ref 26.0–34.0)
MCHC: 32.3 g/dL (ref 30.0–36.0)
MCV: 81.5 fL (ref 80.0–100.0)
Platelets: 426 10*3/uL — ABNORMAL HIGH (ref 150–400)
RBC: 4.22 MIL/uL (ref 4.22–5.81)
RDW: 15.9 % — ABNORMAL HIGH (ref 11.5–15.5)
WBC: 12.4 10*3/uL — ABNORMAL HIGH (ref 4.0–10.5)
nRBC: 0 % (ref 0.0–0.2)

## 2023-12-08 LAB — GLUCOSE, CAPILLARY: Glucose-Capillary: 116 mg/dL — ABNORMAL HIGH (ref 70–99)

## 2023-12-08 LAB — STREP PNEUMONIAE URINARY ANTIGEN: Strep Pneumo Urinary Antigen: NEGATIVE

## 2023-12-08 NOTE — Assessment & Plan Note (Signed)
-   Healthcare associated pneumonia - Patient still reporting productive coughing spells, short winded sensation with activity and images demonstrating positive infiltrates with persistent pneumonia pattern. - Continue treatment with IV antibiotics - Continue bronchodilator management and mucolytic's - Follow strep pneumoniae/Legionella antigen and continue supportive care.

## 2023-12-08 NOTE — Plan of Care (Signed)

## 2023-12-08 NOTE — TOC Initial Note (Signed)
 Transition of Care [facility]) - Initial/Assessment Note    Patient Details  Name: [name] MRN: [id number] Date of Birth: [birth year]  Transition of Care [facility]) CM/SW Contact:    Lynda Sands, RN Phone Number: 12/08/2023, 3:38 PM  Clinical Narrative:    Patient admitted with pneumonia.  CM met with patient at bedside. Patient lives with spouse. Does not  own any DME.  No home services. Patient drives himself to appointments.            Expected Discharge Plan: Home/Self Care Barriers to Discharge: Continued Medical Work up   Patient Goals and CMS Choice   CMS Medicare.gov Compare Post Acute Care list provided to:: Patient        Expected Discharge Plan and Services       Living arrangements for the past 2 months: Skilled Nursing Facility                   Prior Living Arrangements/Services Living arrangements for the past 2 months: Skilled Nursing Facility Lives with:: Spouse          Need for Family Participation in Patient Care: Yes (Comment) Care giver support system in place?: Yes (comment)      Activities of Daily Living   ADL Screening (condition at time of admission) Independently performs ADLs?: Yes (appropriate for developmental age) Is the patient deaf or have difficulty hearing?: Yes Does the patient have difficulty seeing, even when wearing glasses/contacts?: No Does the patient have difficulty concentrating, remembering, or making decisions?: Yes  Permission Sought/Granted       Emotional Assessment Appearance:: Appears stated age Attitude/Demeanor/Rapport: Ambitious Affect (typically observed): Appropriate Orientation: : Oriented to Self, Oriented to Place, Oriented to  Time, Oriented to Situation      Admission diagnosis:  Pneumonia [J18.9] Weakness [R53.1] Metastatic cancer to bone (HCC) [C79.51] Spinal stenosis of thoracolumbar region [M48.05] Lytic bone lesions on xray [M89.8X9] Pneumonia of both upper lobes due to infectious  organism [J18.9] Pathological fracture of vertebra due to neoplastic disease, initial encounter [M84.58XA] Patient Active Problem List   Diagnosis Date Noted   Pneumonia 12/07/2023   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   H/O adenomatous polyp of colon 11/22/2022   Aortic stenosis 05/30/2022   Coronary artery disease involving coronary bypass graft of native heart without angina pectoris 11/15/2021   Pain and swelling of right elbow 04/23/2021   Fall    Closed nondisplaced intertrochanteric fracture of left femur (HCC)    Closed left hip fracture, initial encounter (HCC) 04/01/2021   CAP (community acquired pneumonia) 05/21/2019   Diarrhea 03/18/2019   Loss of weight 03/18/2019   Klebsiella sepsis (HCC) 09/15/2018   Urinary tract infection without hematuria    Acute lower UTI 09/13/2018   S/P colostomy takedown 03/26/2018   Hypertension 12/12/2017   Type 2 diabetes mellitus with diabetic neuropathy (HCC) 12/12/2017   CKD (chronic kidney disease), stage III (HCC) 12/12/2017   Hypercholesteremia 12/12/2017   Hypokalemia 12/12/2017   GI bleed 12/06/2017   Rectal bleed 12/06/2017   Uncontrolled type 2 diabetes mellitus with hyperglycemia, with long-term current use of insulin  (HCC) 12/06/2017   Acute renal failure superimposed on stage 3 chronic kidney disease (HCC) 12/06/2017   Malnutrition of moderate degree 12/06/2017   Abnormal CT scan, colon 11/23/2017   Constipation 11/23/2017   Lobar pneumonia (HCC) 10/02/2017   Diverticulitis large intestine 08/19/2017   Diverticulitis of large intestine with perforation and abscess without bleeding    Diverticulitis s/p  sigmoid colectomy/colostomy 12/13/2017 08/16/2017   CKD (chronic kidney disease) stage 3, GFR 30-59 ml/min (HCC) 08/02/2017   Sepsis due to undetermined organism (HCC) 08/01/2017   GERD (gastroesophageal reflux disease) 08/01/2017   PAD (peripheral artery disease) (HCC) 08/09/2016   Claudication (HCC) 05/08/2016    HCAP (healthcare-associated pneumonia) 01/12/2016   Hyponatremia 01/12/2016   Abnormal chest CT 10/13/2015   Hx of CABG 10/13/2015   Type 2 diabetes, HbA1c goal < 7% (HCC)    Unstable angina (HCC)    Hypomagnesemia    Type 2 diabetes mellitus (HCC) 10/09/2015   ASCVD (arteriosclerotic cardiovascular disease) 10/09/2015   CKD (chronic kidney disease) stage 2, GFR 60-89 ml/min 10/09/2015   Sepsis (HCC) 07/09/2015   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 [place] [address] Phone: [phone] Fax: [phone]  [facility] - [place], [place] - [address] [address] Phone: [phone] Fax: [phone]  [facility] [address] [place] [address] Phone: [phone] Fax: [phone]     Social Drivers of Health (SDOH) Social History: SDOH Screenings   Food Insecurity: No Food Insecurity (12/07/2023)  Housing: Low Risk  (12/07/2023)  Transportation Needs: No Transportation Needs (12/07/2023)  Utilities: Not At Risk (12/07/2023)  Financial Resource Strain: Low Risk  (10/08/2023)   Received from [facility]  Social Connections: Socially Integrated (12/07/2023)  Tobacco Use: Medium Risk (12/07/2023)  Health Literacy: High Risk (10/08/2023)   Received from [facility]   SDOH Interventions:     Readmission Risk Interventions     No data to display

## 2023-12-08 NOTE — Assessment & Plan Note (Signed)
-   Will continue treatment with statin and Pletal .

## 2023-12-08 NOTE — Assessment & Plan Note (Signed)
-   Per evaluation of patient GFR he meet criteria for chronic kidney disease stage IV - Renal function appears to be at baseline - Continue minimizing nephrotoxic agents, maintaining adequate oral hydration and will follow renal function trend.

## 2023-12-08 NOTE — Progress Notes (Signed)
 Progress Note   Patient: [name] ZOX:[id number] DOB: [birth year] DOA: 12/07/2023     1 DOS: the patient was seen and examined on 12/08/2023   Brief hospital admission narrative: As per H&P written by Dr. Osborne Dunn on 12/07/2023 [name]  is a 78 y.o. male, past medical history of TIA, diabetes, CAD status post PCI CABG, recent hospitalization at [facility] in late February for pneumonia, presents ED secondary to complaint of generalized weakness, fatigue, and back pain, and is currently somnolent as he received IV pain medicine, but he able to wake up participate in the conversation, family assists with the history, patient has been with significant pain over the last 2 weeks, but it came unbearable 2 days ago which prompted him to come to ED. - ED workup significant for creatinine 2.5, at baseline, total CK at 45, white blood cell count at 11.9, hemoglobin at baseline of 12, respiratory panel is negative, initial CT chest without contrast with persistent pneumonia, and T9 pathologic compression fracture, so MRI thoracic lumbar spine was obtained which was significant for notable enhancing lesions, and ribs, thoracic, lumbar, sacrum and BL pelvic area reviewed and imaging, [facility] hospitalist consulted to admit.  Assessment and plan Pneumonia - Healthcare associated pneumonia - Patient still reporting productive coughing spells, short winded sensation with activity and images demonstrating positive infiltrates with persistent pneumonia pattern. - Continue treatment with IV antibiotics - Continue bronchodilator management and mucolytic's - Follow strep pneumoniae/Legionella antigen and continue supportive care.  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   PVD (peripheral vascular disease) (HCC) - Will continue treatment  with statin and Pletal .  HLD (hyperlipidemia) - Continue Lipitor .  CKD (chronic kidney disease) stage 2, GFR 60-89 ml/min - Per evaluation of patient GFR he meet criteria for chronic kidney disease stage IV - Renal function appears to be at baseline - Continue minimizing nephrotoxic agents, maintaining adequate oral hydration and will follow renal function trend.  Type 2 diabetes mellitus with diabetic neuropathy (HCC) - Continue sliding scale insulin  and the use of adjusted dose of long-acting. - Follow CBG fluctuation and adjust hypoglycemic regimen as needed - Continue holding oral hypoglycemic agents while inpatient.  Lytic bone lesions on xray - With concerns for multiple myeloma - T9 pathologic compression fracture appreciated on images - MRI thoracic spine showing multiple metastatic bone lesions in thoracic, lumbar, sacral and and iliac spine as well and in his ribs. -Presentation and association with renal failure and anemia given concerns for multiple myeloma - Multiple myeloma workup has been initiated and will follow results - Continue analgesic therapy - Will make referral for follow-up with oncology service - PT/OT has been consulted to assist patient ability to perform ADLs.  GERD (gastroesophageal reflux disease) - Continue PPI.   Subjective:  Sleepy, no chest pain, no nausea or vomiting at time of examination.  Chronically ill in appearance and not requiring oxygen supplementation.  Patient is afebrile.  Physical Exam: Vitals:   12/08/23 0003 12/08/23 0418 12/08/23 0838 12/08/23 1309  BP: 128/65 (!) 144/61 (!) 153/64 (!) 115/54  Pulse: 81 83 79 84  Resp: 14 16 16 16   Temp: 98.4 F (36.9 C) 98.5 F (36.9 C) 98.8 F (37.1 C) 98.7 F (37.1 C)  TempSrc: Oral Oral Oral Oral  SpO2: 94% 94% 95%  93%  Weight:      Height:       General exam: Sleepy/somnolent, easily aroused.  Hemodynamically stable.  Currently afebrile. Respiratory system: Positive rhonchi  bilaterally; no using accessory muscles, no wheezing. Cardiovascular system:RRR. No rubs or gallops; no JVD on exam. Gastrointestinal system: Abdomen is nondistended, soft and nontender. No organomegaly or masses felt. Normal bowel sounds heard. Central nervous system: Moving 4 limbs spontaneously.  No focal neurological deficits.  Generalized weakness appreciated. Extremities: No cyanosis or clubbing. Skin: No petechiae. Psychiatry: Flat affect appreciated on exam  Data Reviewed: Basic metabolic panel: Sodium 137, potassium 4.6, chloride 102, bicarb 23, BUN 39, creatinine 2.3, GFR 29 and calcium  8.8 CBC: WBCs 12.4, hemoglobin 11 and platelet count 426K. PSA: 3.89  Family Communication: Daughter at bedside.  Disposition: Status is: Inpatient Remains inpatient appropriate because: IV therapy.   Time spent: 50 minutes  Author: Justina Oman, MD 12/08/2023 5:33 PM  For on call review [email or web address].

## 2023-12-08 NOTE — Assessment & Plan Note (Signed)
 -  Continue Lipitor

## 2023-12-08 NOTE — Assessment & Plan Note (Signed)
-   With concerns for multiple myeloma - T9 pathologic compression fracture appreciated on images - MRI thoracic spine showing multiple metastatic bone lesions in thoracic, lumbar, sacral and and iliac spine as well and in his ribs. -Presentation and association with renal failure and anemia given concerns for multiple myeloma - Multiple myeloma workup has been initiated and will follow results - Continue analgesic therapy - Will make referral for follow-up with oncology service - PT/OT has been consulted to assist patient ability to perform ADLs.

## 2023-12-08 NOTE — Assessment & Plan Note (Signed)
-   Continue sliding scale insulin  and the use of adjusted dose of long-acting. - Follow CBG fluctuation and adjust hypoglycemic regimen as needed - Continue holding oral hypoglycemic agents while inpatient.

## 2023-12-08 NOTE — Progress Notes (Signed)
   12/08/23 1532  TOC Assessment  TOC screening is complete Yes  Once discharged, how will the patient get to their discharge location? Family/Friend - Photographer  Expected Discharge Plan Home/Self Care  Final next level of care Home/Self Care  Barriers to Discharge Continued Medical Work up  [email or web address] Compare Post Acute Care list provided to: Patient  Living arrangements for the past 2 months Skilled Nursing Facility  Lives with: Spouse  Need for Family Participation in Patient Care Y  Care giver support system in place? Y  Appearance: Appears stated age  Attitude/Demeanor/Rapport Ambitious  Affect (typically observed) Appropriate  Orientation:  Oriented to Self;Oriented to Place;Oriented to  Time;Oriented to Situation   Patient admitted with Pneumonia.

## 2023-12-08 NOTE — Assessment & Plan Note (Signed)
 Continue PPI ?

## 2023-12-08 NOTE — Assessment & Plan Note (Signed)
-   No chest pain - Continue treatment with aspirin  and statin - Planning for resumption of home Plavix  at time of discharge (in case patient needed any kind of procedure or biopsy).

## 2023-12-09 DIAGNOSIS — J189 Pneumonia, unspecified organism: Secondary | ICD-10-CM | POA: Diagnosis not present

## 2023-12-09 DIAGNOSIS — Z951 Presence of aortocoronary bypass graft: Secondary | ICD-10-CM | POA: Diagnosis not present

## 2023-12-09 DIAGNOSIS — N182 Chronic kidney disease, stage 2 (mild): Secondary | ICD-10-CM | POA: Diagnosis not present

## 2023-12-09 DIAGNOSIS — I2510 Atherosclerotic heart disease of native coronary artery without angina pectoris: Secondary | ICD-10-CM | POA: Diagnosis not present

## 2023-12-09 LAB — LEGIONELLA PNEUMOPHILA SEROGP 1 UR AG: L. pneumophila Serogp 1 Ur Ag: NEGATIVE

## 2023-12-09 MED ORDER — AZITHROMYCIN 250 MG PO TABS
500.0000 mg | ORAL_TABLET | Freq: Every day | ORAL | Status: DC
  Administered 2023-12-09: 500 mg via ORAL
  Filled 2023-12-09: qty 2

## 2023-12-09 MED ORDER — ONDANSETRON HCL 4 MG/2ML IJ SOLN
4.0000 mg | Freq: Four times a day (QID) | INTRAMUSCULAR | Status: DC | PRN
  Administered 2023-12-09: 4 mg via INTRAVENOUS
  Filled 2023-12-09: qty 2

## 2023-12-09 NOTE — Progress Notes (Signed)
 Progress Note   Patient: [name] XBJ:[id number] DOB: Jul 05, 1946 DOA: 12/07/2023     2 DOS: the patient was seen and examined on 12/09/2023   Brief hospital admission narrative: As per H&P written by Dr. Osborne Blazer on 12/07/2023 [name]  is a 78 y.o. male, past medical history of TIA, diabetes, CAD status post PCI CABG, recent hospitalization at [facility] in late February for pneumonia, presents ED secondary to complaint of generalized weakness, fatigue, and back pain, and is currently somnolent as he received IV pain medicine, but he able to wake up participate in the conversation, family assists with the history, patient has been with significant pain over the last 2 weeks, but it came unbearable 2 days ago which prompted him to come to ED. - ED workup significant for creatinine 2.5, at baseline, total CK at 45, white blood cell count at 11.9, hemoglobin at baseline of 12, respiratory panel is negative, initial CT chest without contrast with persistent pneumonia, and T9 pathologic compression fracture, so MRI thoracic lumbar spine was obtained which was significant for notable enhancing lesions, and ribs, thoracic, lumbar, sacrum and BL pelvic area reviewed and imaging, [facility] hospitalist consulted to admit.  Assessment and plan Pneumonia - Healthcare associated pneumonia - Patient still reporting productive coughing spells, short winded sensation with activity and images demonstrating positive infiltrates with persistent pneumonia pattern. - Continue treatment with IV antibiotics - Continue bronchodilator management and mucolytic's - Follow strep pneumoniae/Legionella antigen and continue supportive care.  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   PVD (peripheral vascular disease) (HCC) - Will continue treatment  with statin and Pletal .  HLD (hyperlipidemia) - Continue Lipitor .  CKD (chronic kidney disease) stage 2, GFR 60-89 ml/min - Per evaluation of patient GFR he meet criteria for chronic kidney disease stage IV - Renal function appears to be at baseline - Continue minimizing nephrotoxic agents, maintaining adequate oral hydration and will follow renal function trend.  Type 2 diabetes mellitus with diabetic neuropathy (HCC) - Continue sliding scale insulin  and the use of adjusted dose of long-acting. - Follow CBG fluctuation and adjust hypoglycemic regimen as needed - Continue holding oral hypoglycemic agents while inpatient.  Lytic bone lesions on xray - With concerns for multiple myeloma - T9 pathologic compression fracture appreciated on images - MRI thoracic spine showing multiple metastatic bone lesions in thoracic, lumbar, sacral and and iliac spine as well and in his ribs. -Presentation and association with renal failure and anemia given concerns for multiple myeloma - Multiple myeloma workup has been initiated and will follow results - Continue analgesic therapy - Will make referral for follow-up with oncology service - PT/OT has been consulted to assist patient ability to perform ADLs.  GERD (gastroesophageal reflux disease) - Continue PPI.   Subjective:  Reports improvement in his back pain; more alert, interactive and at time of examination sitting up on the chair.  No nausea, no vomiting, no chest pain and good saturation on room air.  Physical Exam: Vitals:   12/08/23 2111 12/08/23 2224 12/09/23 0800 12/09/23 1226  BP: (!) 181/73 (!) 140/61 137/78 (!) 147/63  Pulse: 79 77 93 82  Resp:    16  Temp:    (!) 97.5 F (36.4 C)  TempSrc:    Oral  SpO2: 96%  97% 98%  Weight:      Height:       General exam: Alert, awake, oriented x 3; feeling better. Respiratory system: positive rhonchi, no using accessory muscles. Good saturation on RA. Cardiovascular system: RRR. No  rubs or gallops. No JVD. Gastrointestinal system: Abdomen is nondistended, soft and nontender. No organomegaly or masses felt. Normal bowel sounds heard. Central nervous system: No focal neurological deficits. Extremities: No cyanosis, no clubbing. Skin: No petechiae. Psychiatry: Judgement and insight appear normal. Flat affect on exam.  Data Reviewed: Basic metabolic panel: Sodium 137, potassium 4.6, chloride 102, bicarb 23, BUN 39, creatinine 2.3, GFR 29 and calcium  8.8 CBC: WBCs 12.4, hemoglobin 11 and platelet count 426K. PSA: 3.89  Family Communication: Daughter at bedside.  Disposition: Status is: Inpatient Remains inpatient appropriate because: IV therapy.   Time spent: 50 minutes  Author: Justina Oman, MD 12/09/2023 2:55 PM  For on call review [email or web address].

## 2023-12-09 NOTE — Plan of Care (Signed)

## 2023-12-10 ENCOUNTER — Emergency Department (HOSPITAL_COMMUNITY)

## 2023-12-10 ENCOUNTER — Observation Stay (HOSPITAL_COMMUNITY)
Admission: EM | Admit: 2023-12-10 | Discharge: 2023-12-11 | Disposition: A | Discharge status: Home / Self Care | Attending: Family Medicine | Admitting: Family Medicine

## 2023-12-10 ENCOUNTER — Other Ambulatory Visit: Payer: Self-pay

## 2023-12-10 DIAGNOSIS — E876 Hypokalemia: Secondary | ICD-10-CM | POA: Diagnosis not present

## 2023-12-10 DIAGNOSIS — Z7982 Long term (current) use of aspirin: Secondary | ICD-10-CM | POA: Insufficient documentation

## 2023-12-10 DIAGNOSIS — Z794 Long term (current) use of insulin: Secondary | ICD-10-CM | POA: Insufficient documentation

## 2023-12-10 DIAGNOSIS — Z87891 Personal history of nicotine dependence: Secondary | ICD-10-CM | POA: Insufficient documentation

## 2023-12-10 DIAGNOSIS — J188 Other pneumonia, unspecified organism: Secondary | ICD-10-CM

## 2023-12-10 DIAGNOSIS — H1132 Conjunctival hemorrhage, left eye: Secondary | ICD-10-CM | POA: Insufficient documentation

## 2023-12-10 DIAGNOSIS — N184 Chronic kidney disease, stage 4 (severe): Secondary | ICD-10-CM | POA: Insufficient documentation

## 2023-12-10 DIAGNOSIS — R55 Syncope and collapse: Principal | ICD-10-CM

## 2023-12-10 DIAGNOSIS — J189 Pneumonia, unspecified organism: Secondary | ICD-10-CM | POA: Insufficient documentation

## 2023-12-10 DIAGNOSIS — I951 Orthostatic hypotension: Secondary | ICD-10-CM | POA: Diagnosis not present

## 2023-12-10 DIAGNOSIS — E1140 Type 2 diabetes mellitus with diabetic neuropathy, unspecified: Secondary | ICD-10-CM | POA: Insufficient documentation

## 2023-12-10 DIAGNOSIS — Z8673 Personal history of transient ischemic attack (TIA), and cerebral infarction without residual deficits: Secondary | ICD-10-CM | POA: Insufficient documentation

## 2023-12-10 DIAGNOSIS — Z933 Colostomy status: Secondary | ICD-10-CM | POA: Insufficient documentation

## 2023-12-10 DIAGNOSIS — Z79899 Other long term (current) drug therapy: Secondary | ICD-10-CM | POA: Insufficient documentation

## 2023-12-10 DIAGNOSIS — Z951 Presence of aortocoronary bypass graft: Secondary | ICD-10-CM | POA: Insufficient documentation

## 2023-12-10 DIAGNOSIS — Z7984 Long term (current) use of oral hypoglycemic drugs: Secondary | ICD-10-CM | POA: Insufficient documentation

## 2023-12-10 DIAGNOSIS — I2510 Atherosclerotic heart disease of native coronary artery without angina pectoris: Secondary | ICD-10-CM | POA: Insufficient documentation

## 2023-12-10 DIAGNOSIS — I739 Peripheral vascular disease, unspecified: Secondary | ICD-10-CM | POA: Insufficient documentation

## 2023-12-10 DIAGNOSIS — E1122 Type 2 diabetes mellitus with diabetic chronic kidney disease: Secondary | ICD-10-CM | POA: Insufficient documentation

## 2023-12-10 DIAGNOSIS — R0602 Shortness of breath: Secondary | ICD-10-CM | POA: Insufficient documentation

## 2023-12-10 DIAGNOSIS — E785 Hyperlipidemia, unspecified: Secondary | ICD-10-CM | POA: Insufficient documentation

## 2023-12-10 DIAGNOSIS — I129 Hypertensive chronic kidney disease with stage 1 through stage 4 chronic kidney disease, or unspecified chronic kidney disease: Secondary | ICD-10-CM | POA: Insufficient documentation

## 2023-12-10 DIAGNOSIS — Z955 Presence of coronary angioplasty implant and graft: Secondary | ICD-10-CM | POA: Insufficient documentation

## 2023-12-10 DIAGNOSIS — D649 Anemia, unspecified: Secondary | ICD-10-CM

## 2023-12-10 DIAGNOSIS — K219 Gastro-esophageal reflux disease without esophagitis: Secondary | ICD-10-CM | POA: Insufficient documentation

## 2023-12-10 DIAGNOSIS — W19XXXD Unspecified fall, subsequent encounter: Secondary | ICD-10-CM

## 2023-12-10 DIAGNOSIS — M8959 Osteolysis, multiple sites: Secondary | ICD-10-CM | POA: Insufficient documentation

## 2023-12-10 DIAGNOSIS — Z7902 Long term (current) use of antithrombotics/antiplatelets: Secondary | ICD-10-CM | POA: Insufficient documentation

## 2023-12-10 LAB — COMPREHENSIVE METABOLIC PANEL WITH GFR
ALT: 8 U/L (ref 0–44)
AST: 10 U/L — ABNORMAL LOW (ref 15–41)
Albumin: 2.4 g/dL — ABNORMAL LOW (ref 3.5–5.0)
Alkaline Phosphatase: 66 U/L (ref 38–126)
Anion gap: 10 (ref 5–15)
BUN: 37 mg/dL — ABNORMAL HIGH (ref 8–23)
CO2: 18 mmol/L — ABNORMAL LOW (ref 22–32)
Calcium: 6.7 mg/dL — ABNORMAL LOW (ref 8.9–10.3)
Chloride: 107 mmol/L (ref 98–111)
Creatinine, Ser: 2.2 mg/dL — ABNORMAL HIGH (ref 0.61–1.24)
GFR, Estimated: 30 mL/min — ABNORMAL LOW (ref >60)
Glucose, Bld: 156 mg/dL — ABNORMAL HIGH (ref 70–99)
Potassium: 2.8 mmol/L — ABNORMAL LOW (ref 3.5–5.1)
Sodium: 135 mmol/L (ref 135–145)
Total Bilirubin: 0.3 mg/dL (ref 0.0–1.2)
Total Protein: 5.2 g/dL — ABNORMAL LOW (ref 6.5–8.1)

## 2023-12-10 LAB — CBC WITH DIFFERENTIAL/PLATELET
Abs Immature Granulocytes: 0.11 10*3/uL — ABNORMAL HIGH (ref 0.00–0.07)
Basophils Absolute: 0 10*3/uL (ref 0.0–0.1)
Basophils Relative: 0 %
Eosinophils Absolute: 0.1 10*3/uL (ref 0.0–0.5)
Eosinophils Relative: 1 %
HCT: 25.8 % — ABNORMAL LOW (ref 39.0–52.0)
Hemoglobin: 8.2 g/dL — ABNORMAL LOW (ref 13.0–17.0)
Immature Granulocytes: 1 %
Lymphocytes Relative: 5 %
Lymphs Abs: 0.6 10*3/uL — ABNORMAL LOW (ref 0.7–4.0)
MCH: 26.2 pg (ref 26.0–34.0)
MCHC: 31.8 g/dL (ref 30.0–36.0)
MCV: 82.4 fL (ref 80.0–100.0)
Monocytes Absolute: 1.1 10*3/uL — ABNORMAL HIGH (ref 0.1–1.0)
Monocytes Relative: 8 %
Neutro Abs: 11.3 10*3/uL — ABNORMAL HIGH (ref 1.7–7.7)
Neutrophils Relative %: 85 %
Platelets: 314 10*3/uL (ref 150–400)
RBC: 3.13 MIL/uL — ABNORMAL LOW (ref 4.22–5.81)
RDW: 15.9 % — ABNORMAL HIGH (ref 11.5–15.5)
WBC: 13.2 10*3/uL — ABNORMAL HIGH (ref 4.0–10.5)
nRBC: 0 % (ref 0.0–0.2)

## 2023-12-10 LAB — CBC
HCT: 33.7 % — ABNORMAL LOW (ref 39.0–52.0)
Hemoglobin: 10.7 g/dL — ABNORMAL LOW (ref 13.0–17.0)
MCH: 26 pg (ref 26.0–34.0)
MCHC: 31.8 g/dL (ref 30.0–36.0)
MCV: 81.8 fL (ref 80.0–100.0)
Platelets: 390 10*3/uL (ref 150–400)
RBC: 4.12 MIL/uL — ABNORMAL LOW (ref 4.22–5.81)
RDW: 15.6 % — ABNORMAL HIGH (ref 11.5–15.5)
WBC: 10.6 10*3/uL — ABNORMAL HIGH (ref 4.0–10.5)
nRBC: 0 % (ref 0.0–0.2)

## 2023-12-10 LAB — BASIC METABOLIC PANEL WITH GFR
Anion gap: 11 (ref 5–15)
BUN: 40 mg/dL — ABNORMAL HIGH (ref 8–23)
CO2: 25 mmol/L (ref 22–32)
Calcium: 8.7 mg/dL — ABNORMAL LOW (ref 8.9–10.3)
Chloride: 102 mmol/L (ref 98–111)
Creatinine, Ser: 2.44 mg/dL — ABNORMAL HIGH (ref 0.61–1.24)
GFR, Estimated: 27 mL/min — ABNORMAL LOW (ref >60)
Glucose, Bld: 124 mg/dL — ABNORMAL HIGH (ref 70–99)
Potassium: 3.7 mmol/L (ref 3.5–5.1)
Sodium: 138 mmol/L (ref 135–145)

## 2023-12-10 LAB — TROPONIN I (HIGH SENSITIVITY)
Troponin I (High Sensitivity): 3 ng/L (ref <18)
Troponin I (High Sensitivity): 3 ng/L (ref <18)

## 2023-12-10 LAB — D-DIMER, QUANTITATIVE: D-Dimer, Quant: 1.27 ug{FEU}/mL — ABNORMAL HIGH (ref 0.00–0.50)

## 2023-12-10 LAB — POC OCCULT BLOOD, ED: Fecal Occult Blood, POC: NEGATIVE

## 2023-12-10 LAB — BRAIN NATRIURETIC PEPTIDE: B Natriuretic Peptide: 30 pg/mL (ref 0.0–100.0)

## 2023-12-10 LAB — GLUCOSE, CAPILLARY: Glucose-Capillary: 123 mg/dL — ABNORMAL HIGH (ref 70–99)

## 2023-12-10 MED ORDER — ENOXAPARIN SODIUM 30 MG/0.3ML IJ SOSY
30.0000 mg | PREFILLED_SYRINGE | INTRAMUSCULAR | Status: DC
  Administered 2023-12-10: 30 mg via SUBCUTANEOUS
  Filled 2023-12-10: qty 0.3

## 2023-12-10 MED ORDER — TETRACAINE HCL 0.5 % OP SOLN
1.0000 [drp] | Freq: Once | OPHTHALMIC | Status: AC
  Administered 2023-12-10: 1 [drp] via OPHTHALMIC
  Filled 2023-12-10: qty 4

## 2023-12-10 MED ORDER — INSULIN ASPART 100 UNIT/ML IJ SOLN: 0.0000 [IU] | Freq: Every day | INTRAMUSCULAR | Status: DC

## 2023-12-10 MED ORDER — INSULIN GLARGINE-YFGN 100 UNIT/ML ~~LOC~~ SOLN
10.0000 [IU] | Freq: Every day | SUBCUTANEOUS | Status: DC
  Administered 2023-12-10: 10 [IU] via SUBCUTANEOUS
  Filled 2023-12-10 (×2): qty 0.1

## 2023-12-10 MED ORDER — PANTOPRAZOLE SODIUM 40 MG PO TBEC
40.0000 mg | DELAYED_RELEASE_TABLET | Freq: Two times a day (BID) | ORAL | Status: DC
  Administered 2023-12-11 (×2): 40 mg via ORAL
  Filled 2023-12-10 (×2): qty 1

## 2023-12-10 MED ORDER — LEVOFLOXACIN 250 MG PO TABS
500.0000 mg | ORAL_TABLET | ORAL | 0 refills | Status: DC
Start: 2023-12-10 — End: 2023-12-14

## 2023-12-10 MED ORDER — CILOSTAZOL 100 MG PO TABS
100.0000 mg | ORAL_TABLET | Freq: Two times a day (BID) | ORAL | Status: DC
  Filled 2023-12-10: qty 1

## 2023-12-10 MED ORDER — ASPIRIN 81 MG PO TBEC
81.0000 mg | DELAYED_RELEASE_TABLET | Freq: Every day | ORAL | Status: DC
  Administered 2023-12-11: 81 mg via ORAL
  Filled 2023-12-10: qty 1

## 2023-12-10 MED ORDER — METOPROLOL TARTRATE 25 MG PO TABS
25.0000 mg | ORAL_TABLET | Freq: Two times a day (BID) | ORAL | Status: DC
Start: 2023-12-11 — End: 2023-12-11
  Administered 2023-12-11: 25 mg via ORAL
  Filled 2023-12-10: qty 1

## 2023-12-10 MED ORDER — LOSARTAN POTASSIUM 50 MG PO TABS
50.0000 mg | ORAL_TABLET | Freq: Every day | ORAL | Status: DC
  Administered 2023-12-11: 50 mg via ORAL
  Filled 2023-12-10: qty 1

## 2023-12-10 MED ORDER — ACETAMINOPHEN 650 MG RE SUPP: 650.0000 mg | Freq: Four times a day (QID) | RECTAL | Status: DC | PRN

## 2023-12-10 MED ORDER — VITAMIN B-12 1000 MCG PO TABS
1000.0000 ug | ORAL_TABLET | Freq: Every day | ORAL | Status: DC
  Administered 2023-12-11: 1000 ug via ORAL
  Filled 2023-12-10: qty 1

## 2023-12-10 MED ORDER — GABAPENTIN 300 MG PO CAPS
600.0000 mg | ORAL_CAPSULE | Freq: Two times a day (BID) | ORAL | Status: DC
  Filled 2023-12-10: qty 2

## 2023-12-10 MED ORDER — ONDANSETRON HCL 4 MG PO TABS: 4.0000 mg | ORAL_TABLET | Freq: Four times a day (QID) | ORAL | Status: DC | PRN

## 2023-12-10 MED ORDER — ATORVASTATIN CALCIUM 40 MG PO TABS
40.0000 mg | ORAL_TABLET | Freq: Every day | ORAL | Status: DC
  Administered 2023-12-10: 40 mg via ORAL
  Filled 2023-12-10: qty 1

## 2023-12-10 MED ORDER — FLUORESCEIN SODIUM 1 MG OP STRP
1.0000 | ORAL_STRIP | Freq: Once | OPHTHALMIC | Status: AC
  Administered 2023-12-10: 1 via OPHTHALMIC
  Filled 2023-12-10: qty 1

## 2023-12-10 MED ORDER — TRAMADOL HCL 50 MG PO TABS: 25.0000 mg | ORAL_TABLET | Freq: Four times a day (QID) | ORAL | 0 refills | Status: DC | PRN

## 2023-12-10 MED ORDER — INSULIN ASPART 100 UNIT/ML IJ SOLN
0.0000 [IU] | Freq: Three times a day (TID) | INTRAMUSCULAR | Status: DC
  Administered 2023-12-11: 5 [IU] via SUBCUTANEOUS
  Administered 2023-12-11: 2 [IU] via SUBCUTANEOUS
  Administered 2023-12-11: 3 [IU] via SUBCUTANEOUS

## 2023-12-10 MED ORDER — ACETAMINOPHEN 325 MG PO TABS: 650.0000 mg | ORAL_TABLET | Freq: Four times a day (QID) | ORAL | Status: DC | PRN

## 2023-12-10 MED ORDER — CALCIUM CARBONATE 1250 (500 CA) MG PO TABS
1000.0000 mg | ORAL_TABLET | Freq: Once | ORAL | Status: AC
  Administered 2023-12-10: 2500 mg via ORAL
  Filled 2023-12-10: qty 2

## 2023-12-10 MED ORDER — ENSURE ENLIVE PO LIQD
237.0000 mL | Freq: Two times a day (BID) | ORAL | Status: DC
  Administered 2023-12-11 (×2): 237 mL via ORAL

## 2023-12-10 MED ORDER — AMLODIPINE BESYLATE 5 MG PO TABS
10.0000 mg | ORAL_TABLET | Freq: Every morning | ORAL | Status: DC
  Administered 2023-12-11: 10 mg via ORAL
  Filled 2023-12-10: qty 2

## 2023-12-10 MED ORDER — ONDANSETRON HCL 4 MG/2ML IJ SOLN: 4.0000 mg | Freq: Four times a day (QID) | INTRAMUSCULAR | Status: DC | PRN

## 2023-12-10 MED ORDER — METOPROLOL TARTRATE 25 MG PO TABS: 25.0000 mg | ORAL_TABLET | Freq: Two times a day (BID) | ORAL | Status: DC

## 2023-12-10 MED ORDER — SODIUM CHLORIDE 0.9 % IV BOLUS
1000.0000 mL | Freq: Once | INTRAVENOUS | Status: AC
  Administered 2023-12-11: 1000 mL via INTRAVENOUS

## 2023-12-10 MED ORDER — POTASSIUM CHLORIDE CRYS ER 20 MEQ PO TBCR
40.0000 meq | EXTENDED_RELEASE_TABLET | Freq: Once | ORAL | Status: AC
  Administered 2023-12-10: 40 meq via ORAL
  Filled 2023-12-10: qty 2

## 2023-12-10 MED ORDER — AZITHROMYCIN 500 MG IV SOLR
500.0000 mg | INTRAVENOUS | Status: DC
  Administered 2023-12-10: 500 mg via INTRAVENOUS
  Filled 2023-12-10 (×4): qty 5

## 2023-12-10 MED ORDER — SODIUM CHLORIDE 0.9 % IV SOLN: INTRAVENOUS | Status: DC

## 2023-12-10 MED ORDER — SENNOSIDES-DOCUSATE SODIUM 8.6-50 MG PO TABS: 1.0000 | ORAL_TABLET | Freq: Every evening | ORAL | Status: DC | PRN

## 2023-12-10 MED ORDER — SODIUM CHLORIDE 0.9 % IV SOLN
1.0000 g | Freq: Once | INTRAVENOUS | Status: AC
  Administered 2023-12-10: 1 g via INTRAVENOUS
  Filled 2023-12-10: qty 10

## 2023-12-10 MED ORDER — IOHEXOL 350 MG/ML SOLN
60.0000 mL | Freq: Once | INTRAVENOUS | Status: AC | PRN
  Administered 2023-12-10: 60 mL via INTRAVENOUS

## 2023-12-10 NOTE — Plan of Care (Signed)
  Problem: Health Behavior/Discharge Planning: Goal: Ability to manage health-related needs will improve Outcome: Progressing   Problem: Clinical Measurements: Goal: Ability to maintain clinical measurements within normal limits will improve Outcome: Progressing Goal: Will remain free from infection Outcome: Progressing Goal: Diagnostic test results will improve Outcome: Progressing   Problem: Activity: Goal: Risk for activity intolerance will decrease Outcome: Progressing   Problem: Nutrition: Goal: Adequate nutrition will be maintained Outcome: Progressing   Problem: Pain Managment: Goal: General experience of comfort will improve and/or be controlled Outcome: Progressing   Problem: Safety: Goal: Ability to remain free from injury will improve Outcome: Progressing   Problem: Skin Integrity: Goal: Risk for impaired skin integrity will decrease Outcome: Progressing   Problem: Clinical Measurements: Goal: Ability to maintain a body temperature in the normal range will improve Outcome: Progressing

## 2023-12-10 NOTE — ED Triage Notes (Addendum)
 Coming from home, recent discharge in the this morning from inpatient after double pneumonia. Was sitting outside when patient fell out of the chair unconscious, witnessed by family. 80/53 bp, 136/53 after bolus. Cbg 202. Left eye redness/bloodshot (was discharged from hospital with it) but now c/o blurry vision. A+Ox4.  On plavix , took this morning. Patient denies any pains or any other complaints, states he feels fine.

## 2023-12-10 NOTE — ED Provider Notes (Signed)
 [facility] [facility] AT [facility] Provider Note   CSN: [id number] Arrival date & time: 12/10/23  1631     History  Chief Complaint  Patient presents with   Loss of Consciousness    [name] is a 78 y.o. male with PMH as listed below who presents from home, h/o recent discharge in the this morning from inpatient after double pneumonia. Was sitting outside when patient fell out of the chair unconscious, witnessed by family. 80/53 bp, 136/53 after bolus. Cbg 202. Left eye redness/bloodshot (was discharged from hospital with it) but now c/o blurry vision like there is a film over everything. Denies ocular pain and states that it started before the fall. Denies pain with EOM or any foreign body sensation on the eye.  On plavix , took this morning. Patient denies any pains or any other complaints, states he feels fine.  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 Authorizing Provider  amLODipine  (NORVASC ) 10 MG tablet Take 10 mg by mouth in the morning.    [provider]  aspirin  EC 81 MG tablet Take 1 tablet (81 mg total) by mouth daily with breakfast. 05/23/19   Emokpae, Courage, MD  atorvastatin  (LIPITOR ) 40 MG tablet Take 1 tablet (40 mg total) by mouth at bedtime. 12/11/23   Johnson, Clanford L, MD  baclofen (LIORESAL) 10 MG tablet  Take 10 mg by mouth at bedtime. 12/05/23   [provider]  cilostazol  (PLETAL ) 100 MG tablet Take 100 mg by mouth 2 (two) times daily.    [provider]  clopidogrel  (PLAVIX ) 75 MG tablet Take 75 mg by mouth in the morning.    [provider]  empagliflozin  (JARDIANCE ) 25 MG TABS tablet 12.5 mg daily as directed 12/11/23   Lincoln Renshaw, Clanford L, MD  gabapentin  (NEURONTIN ) 600 MG tablet Take 600 mg by mouth 2 (two) times daily.    [provider]  insulin  glargine (LANTUS ) 100 UNIT/ML injection Inject 0.1 mLs (10 Units total) into the skin at bedtime. 12/11/23   Johnson, Clanford L, MD  levofloxacin  (LEVAQUIN ) 250 MG tablet Take 1 tablet (250 mg total) by mouth daily for 2 days. 12/12/23 12/14/23  Johnson, Clanford L, MD  losartan  (COZAAR ) 50 MG tablet Take 50 mg by mouth daily. 03/02/22   [provider]  metoprolol  tartrate (LOPRESSOR ) 50 MG tablet Take 25 mg by mouth 2 (two) times daily.     [provider]  pantoprazole  (PROTONIX ) 40 MG tablet Take 1 tablet (40 mg total) by mouth daily. 12/11/23   Johnson, Clanford L, MD  traMADol  (ULTRAM ) 50 MG tablet Take 0.5 tablets (25 mg total) by mouth every 12 (twelve) hours as needed for severe pain (pain score 7-10). 12/11/23  12/10/24  Johnson, Clanford L, MD  vitamin B-12 (CYANOCOBALAMIN ) 1000 MCG tablet Take 1,000 mcg by mouth in the morning.    [provider]      Allergies    Acarbose    Review of Systems   Review of Systems A 10 point review of systems was performed and is negative unless otherwise reported in HPI.  Physical Exam Updated Vital Signs BP (!) 171/77 (BP Location: Right Arm)   Pulse (!) 102   Temp 97.6 F (36.4 C) (Oral)   Resp 16   Ht 5\' 7"  (1.702 m)   Wt 63.6 kg   SpO2 96%   BMI 21.96 kg/m  Physical Exam General: Normal appearing male, lying in bed.  HEENT: NCAT, no skull depressions/deformities, no facial trauma noted. Sclera anicteric, MMM, trachea midline.  PERRLA, EOMI, ballotable eyes bilaterally, no proptosis. Conjuncitval hemorrhage noted on left eye. No ocular trauma noted. Clear anterior chamber. Fluorescein  stain unremarkable.  Cardiology: RRR, no murmurs/rubs/gallops.  Resp: Normal respiratory rate and effort. CTAB, no wheezes, rhonchi, crackles.  Abd: Soft, non-tender, non-distended. No rebound tenderness or guarding.  Rectal: Performed with RN chaperone.  Normal-appearing external anal sphincter, no hemorrhoids noted.  On internal exam, no masses or hemorrhoids palpated.  No gross blood on glove. MSK: No peripheral edema or signs of trauma. Extremities without deformity or TTP. No cyanosis or clubbing. Skin: warm, dry.  Neuro: A&Ox4, CNs II-XII grossly intact. MAEs. Sensation grossly intact.  Psych: Normal mood and affect.   ED Results / Procedures / Treatments   Labs (all labs ordered are listed, but only abnormal results are displayed) Labs Reviewed  CBC WITH DIFFERENTIAL/PLATELET - Abnormal; Notable for the following components:      Result Value   WBC 13.2 (*)    RBC 3.13 (*)    Hemoglobin 8.2 (*)    HCT 25.8 (*)    RDW 15.9 (*)    Neutro Abs 11.3 (*)    Lymphs Abs 0.6 (*)    Monocytes Absolute 1.1 (*)    Abs Immature Granulocytes 0.11 (*)    All other components within normal limits  COMPREHENSIVE METABOLIC PANEL WITH GFR - Abnormal; Notable for the following components:   Potassium 2.8 (*)    CO2 18 (*)    Glucose, Bld 156 (*)    BUN 37 (*)    Creatinine, Ser 2.20 (*)    Calcium  6.7 (*)    Total Protein 5.2 (*)    Albumin  2.4 (*)    AST 10 (*)    GFR, Estimated 30 (*)    All other components within normal limits  D-DIMER, QUANTITATIVE - Abnormal; Notable for the following components:   D-Dimer, Quant 1.27 (*)    All other components within normal limits  GLUCOSE, CAPILLARY - Abnormal; Notable for the following components:   Glucose-Capillary 142 (*)    All other components within normal limits  POC OCCULT  BLOOD, ED - Normal  BRAIN NATRIURETIC PEPTIDE  PROCALCITONIN  TSH  MAGNESIUM   PHOSPHORUS  TROPONIN I (HIGH SENSITIVITY)  TROPONIN I (HIGH SENSITIVITY)    EKG EKG Interpretation Date/Time:  Monday December 10 2023 16:57:00 EDT Ventricular Rate:  91 PR Interval:  227 QRS Duration:  122 QT Interval:  374 QTC Calculation: 461 R Axis:   45  Text Interpretation: Sinus rhythm Prolonged PR interval Nonspecific intraventricular conduction delay Confirmed by Annita Kindle [phone]) on 12/10/2023 8:22:36 PM  Radiology CTH: 1. No evidence of acute intracranial abnormality. 2.  Mild chronic small vessel ischemia.  CT PE: 1. No acute pulmonary embolism. 2. Multifocal pulmonary infiltrates, slightly improved within the left upper lobe, but progressive within the right upper lobe in keeping with changes of multifocal pneumonia. 3. Multiple lytic lesions within the T7 and T9 vertebral bodies, with pathologic fracture of the T9 vertebral body, and right 4 and 7 ribs, and pathologic fracture of the right seventh rib. The findings are in keeping with metastatic disease, less likely multiple myeloma. These are better assessed on MRI examination 12/07/2023. 4. 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 Clinical correlation for signs and symptoms chronic mesenteric ischemia recommended. If indicated, this could be better assessed with dedicated CT arteriography. 5. Cholelithiasis.  CXR: Shallow inspiration with bilateral mid to lower lung field atelectasis or infiltrate.  Procedures Procedures    Medications Ordered in ED Medications  fluorescein  ophthalmic strip 1 strip (1 strip Left Eye Given by Other 12/10/23 2100)  tetracaine  (PONTOCAINE) 0.5 % ophthalmic solution 1 drop (1 drop Left Eye Given by Other 12/10/23 2100)  potassium chloride  SA (KLOR-CON  M) CR tablet 40 mEq (40 mEq Oral Given  12/10/23 1916)  calcium  carbonate (OS-CAL - dosed in mg of elemental calcium ) tablet 2,500 mg (2,500 mg Oral Given 12/10/23 1916)  iohexol  (OMNIPAQUE ) 350 MG/ML injection 60 mL (60 mLs Intravenous Contrast Given 12/10/23 1938)  cefTRIAXone  (ROCEPHIN ) 1 g in sodium chloride  0.9 % 100 mL IVPB (0 g Intravenous Stopped 12/10/23 2146)  sodium chloride  0.9 % bolus 1,000 mL (0 mLs Intravenous Stopped 12/11/23 1644)  potassium chloride  SA (KLOR-CON  M) CR tablet 40 mEq (40 mEq Oral Given 12/10/23 2346)    ED Course/ Medical Decision Making/ A&P                          Medical Decision Making Amount and/or Complexity of Data Reviewed Labs: ordered. Decision-making details documented in ED Course. Radiology: ordered. Decision-making details documented in ED Course.  Risk OTC drugs. Prescription drug management. Decision regarding hospitalization.    This patient presents to the ED for concern of syncope and collapse, this involves an extensive number of treatment options, and is a complaint that carries with it a high risk of complications and morbidity.  I considered the following differential and admission for this acute, potentially life threatening condition.   MDM:    For patient's ocular abnormality, he has no ocular pain, extraocular movements intact, PERRLA, very low concern for a ACG, retrobulbar hematoma.  Fluorescein  stain is unremarkable, no concern for globe rupture or corneal abrasion/corneal ulcer, traumatic iritis.  Patient states that this was actually present prior to the trauma and I have higher suspicion for conjunctival hemorrhage.   For patient's syncope, I am concerned that this would be high risk syncope given patient's lack of any prodromal symptoms including lightheadedness, tunnel vision.  States that it did not occur as he was standing from sitting or lying down.  Will check orthostatic vitals.  Given his recent admission, will obtain dimer to rule out PE though he has no  chest pain, shortness of breath tachypnea or hypoxia.  No signs or symptoms of DVT on exam.  Recently was treated for pneumonia as well, will obtain a chest x-ray.  Consider hypo-/hyperglycemia, electrolyte derangements.  No orthopnea or lower extreme edema to indicate heart failure.  Clinical Course as of 12/13/23 1519  Mon Dec 10, 2023  1812 Potassium(!): 2.8 +hypokalemia [HN]  1820 DG Chest Portable 1 View Shallow inspiration with bilateral mid to lower lung field atelectasis or infiltrate.   [HN]  2021 D-Dimer, Quant(!): 1.27 Positive dimer, CT PE pending [HN]  2022 Troponin I (High Sensitivity): 3 neg [HN]  2022 B Natriuretic Peptide: 30.0 neg [HN]  2022 CT Head Wo Contrast 1. No evidence of acute intracranial abnormality. 2. Mild chronic small vessel ischemia.   [HN]  2023 CT Angio Chest PE W and/or Wo Contrast 1. No acute pulmonary embolism. 2. Multifocal pulmonary infiltrates, slightly improved within the left upper lobe, but progressive within the right upper lobe in keeping with changes of multifocal pneumonia. 3. Multiple lytic lesions within the T7 and T9 vertebral bodies, with pathologic fracture of the T9 vertebral body, and right 4 and 7 ribs, and pathologic fracture of the right seventh rib. The findings are in keeping with metastatic disease, less likely multiple myeloma. These are better assessed on MRI examination 12/07/2023. 4. 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 Clinical correlation for signs and symptoms chronic mesenteric ischemia recommended. If indicated, this could be better assessed with dedicated CT arteriography.   [HN]  2059 Fecal Occult Blood, POC: Negative [HN]  2059 Troponin I (High Sensitivity): 3 neg [HN]    Clinical Course User Index [HN] Merdis Stalling, MD    Labs: I Ordered, and personally interpreted labs.  The pertinent  results include:  those listed above  Imaging Studies ordered: I ordered imaging studies including CXR I independently visualized and interpreted imaging. I agree with the radiologist interpretation  Additional history obtained from chart review  Cardiac Monitoring: The patient was maintained on a cardiac monitor.  I personally viewed and interpreted the cardiac monitored which showed an underlying rhythm of: Normal sinus rhythm  Reevaluation: After the interventions noted above, I reevaluated the patient and found that they have :stayed the same  Social Determinants of Health: Lives independently  Disposition: Patient with worsening anemia with negative fecal occult blood, hypocalcemia/hypokalemia, and worsening right upper lobe pneumonia on CT PE.  Reassuringly no PE.  Also with high risk syncope.  Will be admitted to medicine for further workup and management.  Given IV antibiotics.  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   Hypercholesteremia    Hypertension    Neuropathy    Peripheral vascular disease (HCC)    s/p R-L FEM-FEM BYPASS   Pneumonia    Tachycardia    after CABG, pt on medicine for this     Medicines Meds ordered this encounter  Medications   fluorescein  ophthalmic strip 1 strip   tetracaine  (PONTOCAINE) 0.5 % ophthalmic solution 1 drop   potassium chloride  SA (KLOR-CON  M) CR tablet 40 mEq   calcium  carbonate (OS-CAL - dosed in mg of elemental calcium ) tablet 2,500 mg   iohexol  (OMNIPAQUE ) 350 MG/ML injection 60 mL   cefTRIAXone  (ROCEPHIN ) 1 g in sodium chloride   0.9 % 100 mL IVPB    Antibiotic Indication::  CAP   DISCONTD: azithromycin  (ZITHROMAX ) 500 mg in sodium chloride  0.9 % 250 mL IVPB    Antibiotic Indication::   CAP   DISCONTD: enoxaparin  (LOVENOX ) injection 30 mg   DISCONTD: acetaminophen  (TYLENOL ) tablet 650 mg   DISCONTD: acetaminophen  (TYLENOL ) suppository 650 mg   DISCONTD: senna-docusate (Senokot-S) tablet 1 tablet   DISCONTD: ondansetron  (ZOFRAN ) tablet 4 mg   DISCONTD: ondansetron  (ZOFRAN ) injection 4 mg   sodium chloride  0.9 % bolus 1,000 mL   potassium chloride  SA (KLOR-CON  M) CR tablet 40 mEq   DISCONTD: insulin  aspart (novoLOG ) injection 0-15 Units    Correction coverage::   Moderate (average weight, post-op)    CBG < 70::   Implement Hypoglycemia Standing Orders and refer to Hypoglycemia Standing Orders sidebar report    CBG 70 - 120::   0 units    CBG 121 - 150::   2 units    CBG 151 - 200::   3 units    CBG 201 - 250::   5 units    CBG 251 - 300::   8 units    CBG 301 - 350::   11 units    CBG 351 - 400::   15 units    CBG > 400:   call MD and obtain STAT lab verification   DISCONTD: insulin  aspart (novoLOG ) injection 0-5 Units    Correction coverage::   HS scale    CBG < 70::   Implement Hypoglycemia Standing Orders and refer to Hypoglycemia Standing Orders sidebar report    CBG 70 - 120::   0 units    CBG 121 - 150::   0 units    CBG 151 - 200::   0 units    CBG 201 - 250::   2 units    CBG 251 - 300::   3 units    CBG 301 - 350::   4 units    CBG 351 - 400::   5 units    CBG > 400:   call MD and obtain STAT lab verification   DISCONTD: amLODipine  (NORVASC ) tablet 10 mg   DISCONTD: aspirin  EC tablet 81 mg   DISCONTD: atorvastatin  (LIPITOR ) tablet 40 mg   DISCONTD: cilostazol  (PLETAL ) tablet 100 mg   DISCONTD: cyanocobalamin  (VITAMIN B12) tablet 1,000 mcg   DISCONTD: feeding supplement (ENSURE ENLIVE / ENSURE PLUS) liquid 237 mL   DISCONTD: gabapentin  (NEURONTIN ) capsule 600 mg   DISCONTD: insulin   glargine-yfgn (SEMGLEE ) injection 10 Units   DISCONTD: losartan  (COZAAR ) tablet 50 mg   DISCONTD: metoprolol  tartrate (LOPRESSOR ) tablet 25 mg   DISCONTD: pantoprazole  (PROTONIX ) EC tablet 40 mg   DISCONTD: metoprolol  tartrate (LOPRESSOR ) tablet 25 mg   DISCONTD: 0.9 %  sodium chloride  infusion   DISCONTD: hydrALAZINE  (APRESOLINE ) injection 10 mg   DISCONTD: hydrALAZINE  (APRESOLINE ) injection 15 mg   atorvastatin  (LIPITOR ) 40 MG tablet    Sig: Take 1 tablet (40 mg total) by mouth at bedtime.   empagliflozin  (JARDIANCE ) 25 MG TABS tablet    Sig: 12.5 mg daily as directed   insulin  glargine (LANTUS ) 100 UNIT/ML injection    Sig: Inject 0.1 mLs (10 Units total) into the skin at bedtime.   pantoprazole  (PROTONIX ) 40 MG tablet    Sig: Take 1 tablet (40 mg total) by mouth daily.   traMADol  (ULTRAM ) 50 MG tablet    Sig: Take 0.5 tablets (25 mg total) by mouth every 12 (twelve) hours as needed for severe pain (pain score 7-10).  levofloxacin  (LEVAQUIN ) 250 MG tablet    Sig: Take 1 tablet (250 mg total) by mouth daily for 2 days.    I have reviewed the patients home medicines and have made adjustments as needed  Problem List / ED Course: Problem List Items Addressed This Visit       Respiratory   Multifocal pneumonia   Relevant Medications   levofloxacin  (LEVAQUIN ) 250 MG tablet     Other   Hypokalemia   Fall   Relevant Orders   Ambulatory referral to Physical Therapy   * (Principal) Syncope and collapse - Primary   Hypocalcemia   Anemia                This note was created using dictation software, which may contain spelling or grammatical errors.    Merdis Stalling, MD 12/13/23 [phone]

## 2023-12-10 NOTE — Progress Notes (Signed)
Went over discharge instructions w/ pt and pt spouse.

## 2023-12-10 NOTE — Plan of Care (Signed)
  Problem: Health Behavior/Discharge Planning: Goal: Ability to manage health-related needs will improve Outcome: Completed/Met   Problem: Clinical Measurements: Goal: Ability to maintain clinical measurements within normal limits will improve Outcome: Completed/Met Goal: Will remain free from infection Outcome: Completed/Met Goal: Diagnostic test results will improve Outcome: Completed/Met   Problem: Activity: Goal: Risk for activity intolerance will decrease Outcome: Completed/Met   Problem: Nutrition: Goal: Adequate nutrition will be maintained Outcome: Completed/Met   Problem: Pain Managment: Goal: General experience of comfort will improve and/or be controlled Outcome: Completed/Met   Problem: Safety: Goal: Ability to remain free from injury will improve Outcome: Completed/Met   Problem: Skin Integrity: Goal: Risk for impaired skin integrity will decrease Outcome: Completed/Met   Problem: Clinical Measurements: Goal: Ability to maintain a body temperature in the normal range will improve Outcome: Completed/Met

## 2023-12-10 NOTE — H&P (Signed)
 History and Physical  SANCHEZ PEEBLES IEP:[id number] DOB: 04-26-1946 DOA: 12/10/2023  PCP: [name]   Chief Complaint: Syncope  HPI: [name] is a 78 y.o. male with medical history significant for TIA, diabetes, CAD status post PCI CABG, recent hospitalization at [facility] in late February for pneumonia who presents back to the ED for evaluation of a syncope episode after discharge from the hospital earlier this morning. Patient had a 3-day hospitalization for pneumonia and generalized weakness. He was discharged in the morning and reports that he and his spouse was sitting outside in the sun on their deck. Later in the afternoon, he got up to go inside and passed out right away hitting the back of his head on the hard deck. Reports he had drank some [facility] but no water  since he was discharged from the hospital.  He also thinks he likely stood up too fast. He denies any chest pain, lightheadedness, shortness of breath, bloody stools, nausea, vomiting, cough, fevers or chills.  He developed redness in his left eye while in the hospital last night and had a brief blurry vision but this has now resolved.  When EMS arrived, patient was found to have a BP of 80/53 which improved to 136/50 5:03 100 cc bolus.  CBG was 202.  ED Course: Initial vitals showed temp 98.5, RR 15, HR 91, BP 145/60, SpO2 97% on room air.  Labs significant for WBC 13.2, Hgb 8.2, sodium 135, K+ 2.8, bicarb 18, glucose 156, BUN/creatinine 37/2.20, calcium  6.7, albumin  2.4, D-dimer 1.27, troponin 3->3, negative fecal occult test.  EKG shows normal sinus with slightly prolonged PR interval and depressed T waves. CXR showed bilateral mild to lower lung field atelectasis versus infiltrate.  CT head with no acute intracranial abnormality. CTA negative for PE.  Patient received tetracaine ophthalmic solution for evaluation of the left eye.  He was given IV Rocephin , IV azithromycin , calcium  carbonate 2500 mg x 1, and KCl  40 mEq x 1. TRH was consulted for admission  Review of Systems: Please see HPI for pertinent positives and negatives. A complete 10 system review of systems are otherwise negative.  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   x 5   Hypercholesteremia    Hypertension    Neuropathy    Peripheral vascular disease (HCC)    s/p R-L FEM-FEM BYPASS   Pneumonia    Tachycardia    after CABG, pt on medicine for this   Past Surgical History:  Procedure Laterality Date   ABDOMINAL AORTOGRAM Bilateral 09/19/2016   Procedure: iliac;  Surgeon: Margherita Shell, MD;  [facility]: [facility];  Service: Cardiovascular;  Laterality: Bilateral;   ABDOMINAL AORTOGRAM W/LOWER EXTREMITY N/A 09/22/2021   Procedure: ABDOMINAL AORTOGRAM W/LOWER EXTREMITY;  Surgeon: Young Hensen, MD;  Location: [facility];  Service: Cardiovascular;  Laterality: N/A;   ABDOMINAL AORTOGRAM W/LOWER EXTREMITY N/A 10/05/2022   Procedure: ABDOMINAL AORTOGRAM W/LOWER EXTREMITY;  Surgeon: Young Hensen, MD;  Location: [facility];  Service: Cardiovascular;  Laterality: N/A;   BIOPSY  05/16/2023   Procedure: BIOPSY;  Surgeon: Suzette Espy, MD;  Location: [facility];  Service: Endoscopy;;   CARDIAC CATHETERIZATION  2003   with stent   CARDIAC CATHETERIZATION N/A 10/11/2015   Procedure: Left Heart Cath and Coronary Angiography;  Surgeon: Peter M Swaziland, MD;  Location: [facility] INVASIVE CV LAB;  Service: Cardiovascular;  Laterality: N/A;   COLON RESECTION N/A 12/13/2017   Procedure: EXPLORATORY LAPAROTOMY, SIGMOID COLECTOMY WITH COLOSTOMY;  Surgeon: Dareen Ebbing, MD;   Location: [facility] OR;  Service: General;  Laterality: N/A;   COLONOSCOPY N/A 09/22/2013   Procedure: COLONOSCOPY;  Surgeon: Suzette Espy, MD;  [facility]: [facility];  Service: Endoscopy;  Laterality: N/A;  9:30 AM   COLONOSCOPY N/A 02/19/2018   Procedure: COLONOSCOPY;  Surgeon: Suzette Espy, MD;  [facility];  Service: Endoscopy;  Laterality: N/A;  2:00pm   COLONOSCOPY WITH PROPOFOL  N/A 05/16/2023   Procedure: COLONOSCOPY WITH PROPOFOL ;  Surgeon: Suzette Espy, MD;  [facility];  Service: Endoscopy;  Laterality: N/A;  730am, asa 3   COLOSTOMY N/A 12/13/2017   Procedure: COLOSTOMY;  Surgeon: Dareen Ebbing, MD;  Location: [facility] OR;  Service: General;  Laterality: N/A;   COLOSTOMY REVERSAL N/A 03/26/2018   Procedure: COLOSTOMY REVERSAL;  Surgeon: Dareen Ebbing, MD;  [facility];  Service: General;  Laterality: N/A;   CORONARY ARTERY BYPASS GRAFT N/A 10/18/2015   Procedure: CORONARY ARTERY BYPASS GRAFTING (CABG) x  five, using left internal mammary artery and right leg greater saphenous vein harvested endoscopically;  Surgeon: Zelphia Higashi, MD;  Location: [facility] OR;  Service: Open Heart Surgery;  Laterality: N/A;   ENDARTERECTOMY FEMORAL Right 08/09/2016   Procedure: ENDARTERECTOMY FEMORAL WITH VEIN PATCH ANGIOPLASTY;  Surgeon: Margherita Shell, MD;  [facility]: [facility];  Service: Vascular;  Laterality: Right;   ESOPHAGOGASTRODUODENOSCOPY N/A 10/24/2017   Dr. Riley Cheadle: hiatal hernia   ESOPHAGOGASTRODUODENOSCOPY (EGD) WITH PROPOFOL  N/A 05/16/2023   Procedure: ESOPHAGOGASTRODUODENOSCOPY (EGD) WITH PROPOFOL ;  Surgeon: Suzette Espy, MD;  [facility];  Service: Endoscopy;  Laterality: N/A;   FEMORAL-FEMORAL BYPASS GRAFT Bilateral 08/09/2016   Procedure: REVISION BYPASS GRAFT RIGHT FEMORAL-LEFT FEMORAL ARTERY;  Surgeon: Margherita Shell, MD;  Location: [facility] OR;  Service: Vascular;  Laterality: Bilateral;   FEMORAL-POPLITEAL BYPASS GRAFT     INTRAMEDULLARY (IM) NAIL  INTERTROCHANTERIC Left 04/03/2021   Procedure: LEFT  HIP INTRAMEDULLARY (IM) NAIL INTERTROCHANTRIC;  Surgeon: Arnie Lao, MD;  Location: [facility];  Service: Orthopedics;  Laterality: Left;   MALONEY DILATION N/A 05/16/2023   Procedure: Londa Rival DILATION;  Surgeon: Suzette Espy, MD;  [facility];  Service: Endoscopy;  Laterality: N/A;   PERIPHERAL VASCULAR BALLOON ANGIOPLASTY Right 09/22/2021   Procedure: PERIPHERAL VASCULAR BALLOON ANGIOPLASTY;  Surgeon: Young Hensen, MD;  [facility];  Service: Cardiovascular;  Laterality: Right;   PERIPHERAL VASCULAR CATHETERIZATION N/A 05/08/2016   Procedure: Lower Extremity Angiography;  Surgeon: Avanell Leigh, MD;  Location: [facility] INVASIVE CV LAB;  Service: Cardiovascular;  Laterality: N/A;   PERIPHERAL VASCULAR INTERVENTION Right 09/19/2016   Procedure: Peripheral Vascular Intervention;  Surgeon: Margherita Shell, MD;  [facility];  Service: Cardiovascular;  Laterality: Right;  ext iliac stent   PERIPHERAL VASCULAR INTERVENTION Right 09/22/2021   Procedure: PERIPHERAL VASCULAR INTERVENTION;  Surgeon: Young Hensen, MD;  [facility];  Service: Cardiovascular;  Laterality: Right;   POLYPECTOMY  02/19/2018   Procedure: POLYPECTOMY;  Surgeon: Suzette Espy, MD;  [facility];  Service: Endoscopy;;   PROCTOSCOPY  10/24/2017   Procedure: PROCTOSCOPY;  Surgeon: Suzette Espy, MD;  [facility];  Service: Endoscopy;;   TEE WITHOUT CARDIOVERSION N/A 10/18/2015   Procedure: TRANSESOPHAGEAL ECHOCARDIOGRAM (TEE);  Surgeon: Zelphia Higashi, MD;  Location: [facility] OR;  Service: Open Heart Surgery;  Laterality: N/A;   Social History:  reports that he quit smoking about 23 years ago. His smoking use included cigarettes. He started smoking about 53 years ago. He has a 60 pack-year smoking history. He has never used smokeless tobacco. He reports that he does not  drink alcohol  and does not use drugs.  Allergies  Allergen Reactions   Acarbose Diarrhea    Family History  Problem Relation Age of Onset   Heart attack Mother    Stroke Mother    Colon cancer Brother        late 8s     Prior to Admission medications   Medication Sig Start Date End Date Taking? Authorizing Provider  amLODipine  (NORVASC ) 10 MG tablet Take 10 mg by mouth in the morning.    [provider]  aspirin  EC 81 MG tablet Take 1 tablet (81 mg total) by mouth daily with breakfast. 05/23/19   Quintella Buck, Courage, MD  atorvastatin  (LIPITOR ) 40 MG tablet Take 1 tablet (40 mg total) by mouth daily at 6 PM. Patient taking differently: Take 40 mg by mouth at bedtime. 09/16/18   Demaris Fillers, MD  baclofen (LIORESAL) 10 MG tablet Take 10 mg by mouth at bedtime. 12/05/23   [provider]  cilostazol  (PLETAL ) 100 MG tablet Take 100 mg by mouth 2 (two) times daily.    [provider]  clopidogrel  (PLAVIX ) 75 MG tablet Take 75 mg by mouth in the morning.    [provider]  empagliflozin  (JARDIANCE ) 25 MG TABS tablet Take 12.5 mg by mouth in the morning.    [provider]  gabapentin  (NEURONTIN ) 600 MG tablet Take 600 mg by mouth 2 (two) times daily.    [provider]  insulin  glargine (LANTUS ) 100 UNIT/ML injection Inject 20 Units into the skin at bedtime.    [provider]  levofloxacin  (LEVAQUIN ) 250 MG tablet Take 2 tablets (500 mg total) by mouth every other day for 4 days. 12/10/23 12/14/23  Gherghe, Costin M, MD  losartan  (COZAAR ) 50 MG tablet Take 50 mg by mouth daily. 03/02/22   [provider]  metFORMIN  (GLUCOPHAGE ) 1000 MG tablet Take 1,000 mg by mouth 2 (two) times daily with a meal.    [provider]  metoprolol  tartrate (LOPRESSOR ) 50 MG tablet Take 25 mg by mouth 2 (two) times daily.     [provider]  pantoprazole  (PROTONIX ) 40 MG tablet Take 1 tablet (40 mg total) by mouth 2 (two) times  daily before a meal. Patient taking differently: Take 40 mg by mouth daily. 05/16/23   Rourk, Windsor Hatcher, MD  traMADol  (ULTRAM ) 50 MG tablet Take 0.5 tablets (25 mg total) by mouth every 6 (six) hours as needed for severe pain (pain score 7-10). 12/10/23 12/09/24  Gherghe, Costin M, MD  vitamin B-12 (CYANOCOBALAMIN ) 1000 MCG tablet Take 1,000 mcg by mouth in the morning.    [provider]    Physical Exam: BP (!) 151/63   Pulse 94   Temp 98.2 F (36.8 C) (Oral)   Resp 18   Ht 5\' 7"  (1.702 m)   Wt 61.6 kg  SpO2 95%   BMI 21.27 kg/m  General: Pleasant, well-appearing elderly man laying in bed. No acute distress. HEENT: Subconjunctival hemorrhage of the left eye. EOMI. Dry mucous membrane. CV: Mild tachycardia. Regular rhythm. No murmurs, rubs, or gallops. No LE edema Pulmonary: Lungs CTAB. Normal effort. No wheezing or rales. Abdominal: Soft, nontender, nondistended. Normal bowel sounds. Extremities: Palpable radial and DP pulses. Normal ROM. Skin: Warm and dry. No obvious rash or lesions. Decreased skin turgor. Neuro: A&Ox3. Moves all extremities. Normal sensation to light touch. No focal deficit. Psych: Normal mood and affect          Labs on Admission:  Basic Metabolic Panel: Recent Labs  Lab 12/07/23 1349 12/08/23 0452 12/10/23 0452 12/10/23 1700  NA 137 137 138 135  K 3.8 4.6 3.7 2.8*  CL 102 102 102 107  CO2 22 23 25  18*  GLUCOSE 112* 147* 124* 156*  BUN 38* 39* 40* 37*  CREATININE 2.52* 2.30* 2.44* 2.20*  CALCIUM  9.4 8.8* 8.7* 6.7*   Liver Function Tests: Recent Labs  Lab 12/07/23 1349 12/10/23 1700  AST 12* 10*  ALT 11 8  ALKPHOS 95 66  BILITOT 0.6 0.3  PROT 7.3 5.2*  ALBUMIN  3.5 2.4*   No results for input(s): "LIPASE", "AMYLASE" in the last 168 hours. No results for input(s): "AMMONIA" in the last 168 hours. CBC: Recent Labs  Lab 12/07/23 1349 12/08/23 0452 12/10/23 0452 12/10/23 1700  WBC 11.9* 12.4* 10.6* 13.2*  NEUTROABS 9.3*  --    --  11.3*  HGB 12.0* 11.1* 10.7* 8.2*  HCT 37.5* 34.4* 33.7* 25.8*  MCV 81.0 81.5 81.8 82.4  PLT 449* 426* 390 314   Cardiac Enzymes: Recent Labs  Lab 12/07/23 1349  CKTOTAL 45*   BNP (last 3 results) Recent Labs    12/10/23 1700  BNP 30.0    ProBNP (last 3 results) No results for input(s): "PROBNP" in the last 8760 hours.  CBG: Recent Labs  Lab 12/07/23 2030 12/08/23 2223  GLUCAP 167* 116*    Radiological Exams on Admission: CT Angio Chest PE W and/or Wo Contrast Result Date: 12/10/2023 CLINICAL DATA:  High probability pulmonary embolism, pneumonia, syncope EXAM: CT ANGIOGRAPHY CHEST WITH CONTRAST TECHNIQUE: Multidetector CT imaging of the chest was performed using the standard protocol during bolus administration of intravenous contrast. Multiplanar CT image reconstructions and MIPs were obtained to evaluate the vascular anatomy. RADIATION DOSE REDUCTION: This exam was performed according to the departmental dose-optimization program which includes automated exposure control, adjustment of the mA and/or kV according to patient size and/or use of iterative reconstruction technique. CONTRAST:  60mL OMNIPAQUE  IOHEXOL  350 MG/ML SOLN COMPARISON:  12/07/2023 FINDINGS: Cardiovascular: Status post coronary artery bypass grafting. Global cardiac size within limits. No pericardial effusion. Central pulmonary arteries are of normal caliber. No intraluminal filling defect identified through the segmental level to suggest acute pulmonary embolism. Extensive atherosclerotic calcification within the thoracic aorta. No aortic aneurysm. Mediastinum/Nodes: No enlarged mediastinal, hilar, or axillary lymph nodes. Thyroid gland, trachea, and esophagus demonstrate no significant findings. Lungs/Pleura: Multifocal pulmonary infiltrates are again identified, slightly improved within the left upper lobe, but progressive within the right upper lobe in keeping with changes of multifocal pneumonia. Left lower  lobe platelike atelectasis. No pneumothorax or pleural effusion. No central obstructing lesion. Upper Abdomen: Cholelithiasis without pericholecystic inflammatory change. No acute abnormality within visualized upper abdomen. 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 Musculoskeletal: Multiple lytic  lesions are again identified within the T7 and T9 vertebral bodies, with pathologic fracture of the T9 vertebral body, and right 4 and 7 ribs, pathologic fracture right seventh rib. The findings are in keeping with with metastatic disease, less likely multiple myeloma. These are better assessed on MRI examination 12/07/2023. Review of the MIP images confirms the above findings. IMPRESSION: 1. No acute pulmonary embolism. 2. Multifocal pulmonary infiltrates, slightly improved within the left upper lobe, but progressive within the right upper lobe in keeping with changes of multifocal pneumonia. 3. Multiple lytic lesions within the T7 and T9 vertebral bodies, with pathologic fracture of the T9 vertebral body, and right 4 and 7 ribs, and pathologic fracture of the right seventh rib. The findings are in keeping with metastatic disease, less likely multiple myeloma. These are better assessed on MRI examination 12/07/2023. 4. 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 Clinical correlation for signs and symptoms chronic mesenteric ischemia recommended. If indicated, this could be better assessed with dedicated CT arteriography. 5. Cholelithiasis. Electronically Signed   By: Worthy Heads M.D.   On: 12/10/2023 20:19   CT Head Wo Contrast Result Date: 12/10/2023 CLINICAL DATA:  Head trauma, minor (Age >= 65y).  Fall from chair. EXAM: CT HEAD WITHOUT CONTRAST TECHNIQUE: Contiguous axial images were obtained from the base of the skull  through the vertex without intravenous contrast. RADIATION DOSE REDUCTION: This exam was performed according to the departmental dose-optimization program which includes automated exposure control, adjustment of the mA and/or kV according to patient size and/or use of iterative reconstruction technique. COMPARISON:  Head CT 09/13/2018 and MRI 03/29/2018 FINDINGS: Brain: There is no evidence of an acute infarct, intracranial hemorrhage, mass, midline shift, or extra-axial fluid collection. There is mild cerebral atrophy. A normal variant cavum septum pellucidum et vergae is noted. A lacunar infarct in the right corona radiata is new but chronic in appearance. Mild hypodensities elsewhere in the cerebral white matter bilaterally are nonspecific but compatible with chronic small vessel ischemia. Vascular: Calcified atherosclerosis at the skull base. No hyperdense vessel. Skull: No acute fracture or suspicious lesion. Sinuses/Orbits: Visualized paranasal sinuses are clear. Moderately large right mastoid effusion. Unremarkable orbits. Other: None. IMPRESSION: 1. No evidence of acute intracranial abnormality. 2. Mild chronic small vessel ischemia. Electronically Signed   By: Aundra Lee M.D.   On: 12/10/2023 20:10   DG Chest Portable 1 View Result Date: 12/10/2023 CLINICAL DATA:  Recent pneumonia.  Syncope. EXAM: PORTABLE CHEST 1 VIEW COMPARISON:  Chest radiograph dated 12/07/2023. FINDINGS: Shallow inspiration. Faint bilateral mid to lower lung field interstitial densities may represent atelectasis or infiltrate. No consolidative changes. No large pleural effusion or pneumothorax. Stable cardiac silhouette. Median sternotomy wires and CABG vascular clips. No acute osseous pathology. IMPRESSION: Shallow inspiration with bilateral mid to lower lung field atelectasis or infiltrate. Electronically Signed   By: Angus Bark M.D.   On: 12/10/2023 17:53   Assessment/Plan [name] is a 78 y.o. male with medical  history significant for TIA, diabetes, CAD status post PCI CABG, recent hospitalization at [facility] in late February for pneumonia who presents back to the ED for evaluation of a syncope episode after discharge from the hospital earlier this morning and admitted for syncope and collapse secondary to orthostatic hypotension.    # Syncope and collapse # Orthostatic hypotension - Discharge earlier this morning after 3-day hospitalization for pneumonia - Had a syncope episode after getting  up from sitting outside on his deck in the sun - Found to to be hypotensive by EMS - Positive orthostatic vitals in the ED - Syncope very likely secondary to orthostatic hypotension - Admit to telemetry bed for telemonitoring - Give IV NS 1 L bolus followed by IV NS at 100 cc/h - Repeat orthostatic vitals tomorrow Orthostatic VS for the past 24 hrs:  BP- Lying Pulse- Lying BP- Sitting Pulse- Sitting BP- Standing at 0 minutes Pulse- Standing at 0 minutes  12/10/23 2207 165/60 94 151/63 94 127/71 93    # Hypokalemia - K+ of 2.8, repleted with 40 mEq of KCl in the ED - Give additional KCl 40 mEq x 1 - Follow-up morning CMP, mag and Phos  # Hypocalcemia - Calcium  of 6.7, corrected calcium  of 8.0 - Status post 2500 mg of elemental calcium  in the ED - Follow-up morning CMP  # Multifocal pneumonia - Recently treated for healthcare associated pneumonia - CT chest showing multifocal pneumonia, improved in some areas and progressed to others - Patient with no respiratory symptoms, denies any fevers, chills, cough or shortness of breath - Status post IV Rocephin  and azithromycin  in the ED - Hold further antibiotics for now, check procalcitonin  # HTN - SBP elevated to 1 45-1 60s on admission but currently down to the 110s - Hold BP meds for tonight and resume tomorrow  # T2DM with diabetic neuropathy - No recent A1c on file, follow-up repeat A1c - Blood sugar 156 on CMP - Semglee  10 units at  bedtime -SSI with meals, CBG monitoring - Continue to hold home oral antidiabetic  # Normocytic anemia - Drop in Hgb from 10.7 earlier this morning to 8.2. - Has a subconjunctival hemorrhage but no other signs of bleeding - Trend CBC  # Subconjunctival hemorrhage - Patient reports his spouse told him he had some blood in his left eye the night prior to discharge - Reports having some blurry vision prior to discharge but this has resolved. - Evaluation by ED provider was negative for corneal abrasion - Continue to monitor  # 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 - No chest pain, EKG without any acute ischemic changes - Continue aspirin  and atorvastatin  - Continue to hold Plavix  for now   # CKD stage IV - Creatinine of 2.20 improved compared to baseline of 2.3-2.4 - Continue IV hydration as above - Avoid nephrotoxic agents  # HLD # PVD - Will continue treatment with atorvastatin  and Pletal .  # Lytic bone lesions on xray - With concerns for multiple myeloma - T9 pathologic compression fracture appreciated on images - MRI thoracic spine showing multiple metastatic bone lesions in thoracic, lumbar, sacral and and iliac spine as well and in his ribs. -Presentation and association with renal failure and anemia given concerns for multiple myeloma - Multiple myeloma workup initiated during last hospitalization - Patient advised to follow-up with oncology in the outpatient   # GERD - Continue PPI.    DVT prophylaxis: Lovenox      Code Status: Full Code  Consults called: None  Family Communication: No family at bedside  Severity of Illness: The appropriate patient status for this patient is OBSERVATION. Observation status is judged to be reasonable and necessary in order to provide the required intensity of service to ensure the patient's safety. The patient's presenting symptoms, physical exam findings, and initial radiographic and laboratory data in the context of their medical  condition is felt to place them at decreased risk  for further clinical deterioration. Furthermore, it is anticipated that the patient will be medically stable for discharge from the hospital within 2 midnights of admission.   Level of care: Telemetry   This record has been created using Conservation officer, historic buildings. Errors have been sought and corrected, but may not always be located. Such creation errors do not reflect on the standard of care.   Vita Grip, MD 12/10/2023, 10:26 PM [facility] Pager: [phone] Isaiah 41:10   If 7PM-7AM, please contact night-coverage [email or web address] Password [email or web address]

## 2023-12-10 NOTE — Discharge Summary (Signed)
 Physician Discharge Summary  [name] FAO:[id number] DOB: July 25, 1946 DOA: 12/07/2023  PCP: [name]  Admit date: 12/07/2023 Discharge date: 12/10/2023  Admitted From: home Disposition:  home  Recommendations for Outpatient Follow-up:  Follow up with PCP in 1-2 weeks Please establish care and follow-up with Dr. Cheree Cords in the next 1 to 2 weeks  Home Health: none Equipment/Devices: none  Discharge Condition: stable CODE STATUS: Full code Diet Orders (From admission, onward)     Start     Ordered   12/07/23 2009  Diet heart healthy/carb modified Room service appropriate? Yes; Fluid consistency: Thin  Diet effective now       Question Answer Comment  Diet-HS Snack? Nothing   Room service appropriate? Yes   Fluid consistency: Thin      12/07/23 2008            HPI: Per admitting MD, [name]  is a 78 y.o. male, past medical history of TIA, diabetes, CAD status post PCI CABG, recent hospitalization at [facility] in late February for pneumonia, presents ED secondary to complaint of generalized weakness, fatigue, and back pain, and is currently somnolent as he received IV pain medicine, but he able to wake up participate in the conversation, family assists with the history, patient has been with significant pain over the last 2 weeks, but it came unbearable 2 days ago which prompted him to come to ED. - ED workup significant for creatinine 2.5, at baseline, total CK at 45, white blood cell count at 11.9, hemoglobin at baseline of 12, respiratory panel is negative, initial CT chest without contrast with persistent pneumonia, and T9 pathologic compression fracture, so MRI thoracic lumbar spine was obtained which was significant for notable enhancing lesions, and ribs, thoracic, lumbar, sacrum and BL pelvic area reviewed and imaging, [facility] hospitalist consulted to admit.  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  Imaging was concerning for pneumonia.  He received 3 days of intravenous antibiotics with subsequent improvement in his respiratory status, cough is improved, he is now able to ambulate and feels overall much improved.  He will be transition to oral antibiotics for 2 additional doses and discharged home in stable condition  Active problems Lytic bone lesions on x-rays -with concern for multiple myeloma, imaging also shows T9 pathologic compression fracture.  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  Multiple myeloma blood work was sent and is pending at the time of discharge.  Advised patient to establish care and follow-up with Dr. Cheree Cords ASAP for additional workup DM2-continue home medications on discharge CKD stage IV-baseline creatinine in the last 12 months 2.4-2.5, currently at baseline PAD-resume home medications CAD status post CABG 2017-resume home medications, no chest pain  Sepsis ruled out   Discharge Instructions   Allergies as of 12/10/2023       Reactions   Acarbose Diarrhea  Medication List     TAKE these medications    amLODipine  10 MG tablet Commonly known as: NORVASC  Take 10 mg by mouth in the morning.   aspirin  EC 81 MG tablet Take 1 tablet (81 mg total) by mouth daily with breakfast.   atorvastatin  40 MG tablet Commonly known as: LIPITOR  Take 1 tablet (40 mg total) by mouth daily at 6  PM. What changed: when to take this   baclofen 10 MG tablet Commonly known as: LIORESAL Take 10 mg by mouth at bedtime.   cilostazol  100 MG tablet Commonly known as: PLETAL  Take 100 mg by mouth 2 (two) times daily.   clopidogrel  75 MG tablet Commonly known as: PLAVIX  Take 75 mg by mouth in the morning.   cyanocobalamin  1000 MCG tablet Commonly known as: VITAMIN B12 Take 1,000 mcg by mouth in the morning.   empagliflozin  25 MG Tabs tablet Commonly known as: JARDIANCE  Take 12.5 mg by mouth in the morning.   gabapentin  600 MG tablet Commonly known as: NEURONTIN  Take 600 mg by mouth 2 (two) times daily.   insulin  glargine 100 UNIT/ML injection Commonly known as: LANTUS  Inject 20 Units into the skin at bedtime.   levofloxacin  250 MG tablet Commonly known as: Levaquin  Take 2 tablets (500 mg total) by mouth every other day for 4 days.   losartan  50 MG tablet Commonly known as: COZAAR  Take 50 mg by mouth daily.   metFORMIN  1000 MG tablet Commonly known as: GLUCOPHAGE  Take 1,000 mg by mouth 2 (two) times daily with a meal.   metoprolol  tartrate 50 MG tablet Commonly known as: LOPRESSOR  Take 25 mg by mouth 2 (two) times daily.   pantoprazole  40 MG tablet Commonly known as: PROTONIX  Take 1 tablet (40 mg total) by mouth 2 (two) times daily before a meal. What changed: when to take this   traMADol  50 MG tablet Commonly known as: Ultram  Take 0.5 tablets (25 mg total) by mouth every 6 (six) hours as needed for severe pain (pain score 7-10).       Consultations: None  Procedures/Studies: None  MR THORACIC SPINE W WO CONTRAST Result Date: 12/07/2023 CLINICAL DATA:  Thoracic spine compression fracture. Back pain. Decreased appetite. EXAM: MRI THORACIC WITHOUT AND WITH CONTRAST TECHNIQUE: Multiplanar and multiecho pulse sequences of the thoracic spine were obtained without and with intravenous contrast. CONTRAST:  6mL GADAVIST  GADOBUTROL  1 MMOL/ML IV SOLN COMPARISON:   CT chest without contrast 12/07/2023. MRI of the thoracic spine without and with contrast 02/22/2021 FINDINGS: Alignment: No significant listhesis is present. Leftward curvature of the thoracic spine is centered at T8-9. Vertebrae: Pathologic compression fracture is confirmed at T9-10. Abnormal enhancement extending extends into an expanded right pedicle. Prominent lesion across the inferior right lateral aspect of the T7 vertebral body measures 2.2 x 1.4 cm. Extensive enhancement encompasses the majority of the T9 vertebral body with some sparing on the left. 11 mm rounded enhancing lesion is present in the left T4. Multiple lesions are present at T2, the largest anteriorly on the left along the inferior endplate measuring 11 mm. No other pathologic fractures are present. No significant extraosseous tumor is present. Multiple rib lesions are present bilaterally without a pathologic rib fracture. Cord: Central T2 hyperintensities noted in the cord at the T2-3 level. No other cord signal changes present. Cord morphology is within normal limits. Paraspinal and other soft tissues: The paraspinous soft tissues are within normal limits. The visualized lung fields are clear. No primary lesion is identified.  Disc levels: No significant central disc protrusion or central canal stenosis is present. Facet hypertrophy contributes 2 right greater than left foraminal stenosis from T8-9 through T11-12. Moderate right foraminal stenosis is also present at T7-8. IMPRESSION: 1. Pathologic compression fracture at T9-10 with abnormal enhancement extending into an expanded right pedicle. 2. Multiple enhancing lesions throughout the thoracic spine as described compatible with metastatic disease or multiple myeloma. 3. Multiple rib lesions bilaterally without a pathologic rib fracture. 4. Central T2 hyperintensity in the cord at the T2-3 level is nonspecific but may represent a small focus of myelomalacia. 5. No significant central canal  stenosis. 6. Facet hypertrophy contributes to right greater than left foraminal stenosis from T8-9 through T11-12. Moderate right foraminal stenosis is also present at T7-8. These results were called by telephone at the time of interpretation on 12/07/2023 at 6:31 pm to provider JULIE HAVILAND , who verbally acknowledged these results. Electronically Signed   By: Audree Leas M.D.   On: 12/07/2023 18:31   MR Lumbar Spine W Wo Contrast Result Date: 12/07/2023 CLINICAL DATA:  Back pain and chills. EXAM: MRI LUMBAR SPINE WITHOUT AND WITH CONTRAST TECHNIQUE: Multiplanar and multiecho pulse sequences of the lumbar spine were obtained without and with intravenous contrast. CONTRAST:  6mL GADAVIST  GADOBUTROL  1 MMOL/ML IV SOLN COMPARISON:  CT of the abdomen and pelvis without contrast 04/13/2023 FINDINGS: Segmentation: 5 non rib-bearing lumbar type vertebral bodies are present. The lowest fully formed vertebral body is L5. Alignment: No significant listhesis is present. Rightward curvature of the lumbar spine is centered at L3. Vertebrae: Multiple enhancing lesions are present throughout the lumbar spine. Lesion anteriorly on the left subjacent to the superior endplate of L3 measures 3.1 x 2.6 x 1.7 cm. And a lesion inferiorly in anteriorly on the right measures 1.8 x 1.5 x 1.8 cm. No pathologic fractures are present. Multiple other smaller lesions are present throughout the lumbar spine, visualized sacrum and iliac bones. Conus medullaris and cauda equina: Conus extends to the L1 level. Conus and cauda equina appear normal. Paraspinal and other soft tissues: Simple cysts are present in the pole of both kidneys. The largest is on the right measuring 3 cm. Atherosclerotic changes are present in the aorta and branch vessels. Abnormal signal in the proximal left iliac artery raises concern for occlusion or slow flow. No significant adenopathy is present. No primary lesion is identified. Disc levels: L1-2: Mild disc  bulging and facet hypertrophy is present. No significant stenosis is present. L2-3: A broad-based disc protrusion is asymmetric to the left. Mild facet hypertrophy is noted bilaterally. Mild left subarticular and moderate left foraminal stenosis is present. L3-4: A broad-based disc protrusion is present. Moderate facet hypertrophy is worse on the right. This results in mild subarticular and moderate foraminal stenosis bilaterally, right greater than left. L4-5: A broad-based disc protrusion is present. Moderate facet hypertrophy is worse on the right. Moderate to severe central canal stenosis is present with right greater than left subarticular narrowing. Moderate foraminal stenosis is worse on the left. L5-S1: A shallow right paramedian disc protrusion is present. This potentially contacts the traversing right L5 nerve roots. Facet spurring contributes to moderate right foraminal stenosis. IMPRESSION: 1. Multiple enhancing lesions throughout the lumbar spine, visualized sacrum and iliac bones compatible with metastatic disease or multiple myeloma. 2. No pathologic fractures. 3. Abnormal signal in the proximal left iliac artery raises concern for occlusion or slow flow. 4. 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 5. Mild left subarticular  and moderate left foraminal stenosis at L2-3. 6. Mild subarticular and moderate foraminal stenosis bilaterally at L3-4, right greater than left. 7. Moderate to severe central canal stenosis at L4-5 with right greater than left subarticular narrowing. 8. Moderate foraminal stenosis bilaterally at L4-5 is worse on the left. 9. Shallow right paramedian disc protrusion at L5-S1 potentially contacts the traversing right L5 nerve roots. 10. Moderate right foraminal stenosis at L5-S1. These results were called by telephone at the time of interpretation on 12/07/2023 at 5:57 Pm to provider [facility], who verbally acknowledged these results. Electronically Signed   By:  Audree Leas M.D.   On: 12/07/2023 18:17   CT Chest Wo Contrast Result Date: 12/07/2023 CLINICAL DATA:  Back pain, weakness, decreased appetite EXAM: CT CHEST WITHOUT CONTRAST TECHNIQUE: Multidetector CT imaging of the chest was performed following the standard protocol without IV contrast. RADIATION DOSE REDUCTION: This exam was performed according to the departmental dose-optimization program which includes automated exposure control, adjustment of the mA and/or kV according to patient size and/or use of iterative reconstruction technique. COMPARISON:  12/07/2023, 08/28/2023 FINDINGS: Cardiovascular: Unenhanced imaging of the heart is unremarkable without pericardial effusion. 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 Dense atherosclerosis of the native coronary vasculature. Normal caliber of the thoracic aorta. Extensive atherosclerosis of the aorta and its branches. Assessment of the vascular lumen cannot be performed without IV contrast. Mediastinum/Nodes: No enlarged mediastinal or axillary lymph nodes. Thyroid gland, trachea, and esophagus demonstrate no significant findings. Lungs/Pleura: Chronic elevation of the left hemidiaphragm, with mild left lower lobe bronchiectasis and scarring again noted. There are also linear opacities within the left upper lobe consistent with scarring seen previously. However, there is some new patchy nodular airspace disease within the bilateral upper lobes, left greater than right, concerning for superimposed pneumonia. No effusion or pneumothorax. Central airways are patent. Upper Abdomen: Cholelithiasis without evidence of acute cholecystitis. No other acute upper abdominal findings. Musculoskeletal: The mixed sclerotic and lucent lesion within the T9 vertebral body with associated right paraspinal soft tissue component is again identified, measuring 2.4 x 2.5 cm reference image 98/2. Since the previous exam, it appears that there is slight  invagination of the superior endplate of the T9 vertebral body on the right, consistent with superimposed pathologic fracture. Less than 10% loss of height. No retropulsion. Further evaluation with MRI may be useful given symptoms of back pain. There is a healing subacute to chronic right posterolateral seventh rib fracture with abundant callus formation, new since prior CT. No other acute bony abnormalities. Reconstructed images demonstrate no additional findings. IMPRESSION: 1. New fracture through the superior endplate of the T9 vertebral body at the site of the mixed sclerotic and lucent lesion seen on prior CT and MRI exams, consistent with pathologic fracture. Further evaluation with MRI may be useful. 2. Patchy bilateral upper lobe nodular airspace disease consistent with inflammatory or infectious etiology. 3. Chronic areas of scarring or atelectasis within the left upper and left lower lobes, with stable elevation of the left hemidiaphragm. 4.  Aortic Atherosclerosis (ICD10-I70.0). Electronically Signed   By: Bobbye Burrow M.D.   On: 12/07/2023 16:39   DG Chest Portable 1 View Result Date: 12/07/2023 CLINICAL DATA:  Cough, back pain EXAM: PORTABLE CHEST - 1 VIEW COMPARISON:  08/09/2023 FINDINGS: Low lung volumes. Chronic linear scarring or atelectasis laterally in the left mid lung. No new infiltrate or overt edema. Heart size and mediastinal contours are within normal limits. Aortic Atherosclerosis (ICD10-170.0). CABG markers. No effusion.  Sternotomy wires. IMPRESSION: Low volumes with chronic left mid lung scarring or atelectasis. Electronically Signed   By: Nicoletta Barrier M.D.   On: 12/07/2023 13:29     Subjective: Patient is doing very well this morning, tells me he wants to go home and is eager to do so.  Says he is willing to follow-up with oncology as an outpatient as soon as possible for his bone lesions  Discharge Exam: BP (!) 147/68 (BP Location: Left Arm)   Pulse 85   Temp 98.8 F (37.1  C) (Oral)   Resp 18   Ht 5\' 7"  (1.702 m)   Wt 61.6 kg   SpO2 95%   BMI 21.27 kg/m   General: Pt is alert, awake, not in acute distress Cardiovascular: RRR, S1/S2 +, no rubs, no gallops Respiratory: CTA bilaterally, no wheezing, no rhonchi Abdominal: Soft, NT, ND, bowel sounds + Extremities: no edema, no cyanosis  The results of significant diagnostics from this hospitalization (including imaging, microbiology, ancillary and laboratory) are listed below for reference.     Microbiology: Recent Results (from the past 240 hours)  Resp panel by RT-PCR (RSV, Flu A&B, Covid) Anterior Nasal Swab     Status: None   Collection Time: 12/07/23  1:50 PM   Specimen: Anterior Nasal Swab  Result Value Ref Range Status   SARS Coronavirus 2 by RT PCR NEGATIVE NEGATIVE Final    Comment: (NOTE) SARS-CoV-2 target nucleic acids are NOT DETECTED.  The SARS-CoV-2 RNA is generally detectable in upper respiratory specimens during the acute phase of infection. The lowest concentration of SARS-CoV-2 viral copies this assay can detect is 138 copies/mL. A negative result does not preclude SARS-Cov-2 infection and should not be used as the sole basis for treatment or other patient management decisions. A negative result may occur with  improper specimen collection/handling, submission of specimen other than nasopharyngeal swab, presence of viral mutation(s) within the areas targeted by this assay, and inadequate number of viral copies(<138 copies/mL). A negative result must be combined with clinical observations, patient history, and epidemiological information. The expected result is Negative.  Fact Sheet for Patients:  BloggerCourse.com  Fact Sheet for Healthcare Providers:  SeriousBroker.it  This test is no t yet approved or cleared by the [place]  FDA and  has been authorized for detection and/or diagnosis of SARS-CoV-2 by FDA under an  Emergency Use Authorization (EUA). This EUA will remain  in effect (meaning this test can be used) for the duration of the COVID-19 declaration under Section 564(b)(1) of the Act, 21 U.S.C.section 360bbb-3(b)(1), unless the authorization is terminated  or revoked sooner.       Influenza A by PCR NEGATIVE NEGATIVE Final   Influenza B by PCR NEGATIVE NEGATIVE Final    Comment: (NOTE) The Xpert Xpress SARS-CoV-2/FLU/RSV plus assay is intended as an aid in the diagnosis of influenza from Nasopharyngeal swab specimens and should not be used as a sole basis for treatment. Nasal washings and aspirates are unacceptable for Xpert Xpress SARS-CoV-2/FLU/RSV testing.  Fact Sheet for Patients: BloggerCourse.com  Fact Sheet for Healthcare Providers: SeriousBroker.it  This test is not yet approved or cleared by the [place]  FDA and has been authorized for detection and/or diagnosis of SARS-CoV-2 by FDA under an Emergency Use Authorization (EUA). This EUA will remain in effect (meaning this test can be used) for the duration of the COVID-19 declaration under Section 564(b)(1) of the Act, 21 U.S.C. section 360bbb-3(b)(1), unless the authorization is terminated or revoked.  Resp Syncytial Virus by PCR NEGATIVE NEGATIVE Final    Comment: (NOTE) Fact Sheet for Patients: BloggerCourse.com  Fact Sheet for Healthcare Providers: SeriousBroker.it  This test is not yet approved or cleared by the [place]  FDA and has been authorized for detection and/or diagnosis of SARS-CoV-2 by FDA under an Emergency Use Authorization (EUA). This EUA will remain in effect (meaning this test can be used) for the duration of the COVID-19 declaration under Section 564(b)(1) of the Act, 21 U.S.C. section 360bbb-3(b)(1), unless the authorization is terminated or revoked.  Performed at [facility],  [address] [place], [place] [address]   Culture, blood (routine x 2)     Status: None (Preliminary result)   Collection Time: 12/07/23  5:52 PM   Specimen: BLOOD RIGHT HAND  Result Value Ref Range Status   Specimen Description BLOOD RIGHT HAND  Final   Special Requests   Final    BOTTLES DRAWN AEROBIC AND ANAEROBIC Blood Culture adequate volume   Culture   Final    NO GROWTH 2 DAYS Performed at [facility], [address], [place], [place] [address]    Report Status PENDING  Incomplete  Culture, blood (routine x 2)     Status: None (Preliminary result)   Collection Time: 12/07/23  6:11 PM   Specimen: Left Antecubital; Blood  Result Value Ref Range Status   Specimen Description LEFT ANTECUBITAL  Final   Special Requests   Final    BOTTLES DRAWN AEROBIC AND ANAEROBIC Blood Culture adequate volume   Culture   Final    NO GROWTH 2 DAYS Performed at [facility], [address] [place], [place] [address]    Report Status PENDING  Incomplete     Labs: Basic Metabolic Panel: Recent Labs  Lab 12/07/23 1349 12/08/23 0452 12/10/23 0452  NA 137 137 138  K 3.8 4.6 3.7  CL 102 102 102  CO2 22 23 25   GLUCOSE 112* 147* 124*  BUN 38* 39* 40*  CREATININE 2.52* 2.30* 2.44*  CALCIUM  9.4 8.8* 8.7*   Liver Function Tests: Recent Labs  Lab 12/07/23 1349  AST 12*  ALT 11  ALKPHOS 95  BILITOT 0.6  PROT 7.3  ALBUMIN  3.5   CBC: Recent Labs  Lab 12/07/23 1349 12/08/23 0452 12/10/23 0452  WBC 11.9* 12.4* 10.6*  NEUTROABS 9.3*  --   --   HGB 12.0* 11.1* 10.7*  HCT 37.5* 34.4* 33.7*  MCV 81.0 81.5 81.8  PLT 449* 426* 390   CBG: Recent Labs  Lab 12/07/23 2030 12/08/23 2223  GLUCAP 167* 116*   Hgb A1c No results for input(s): "HGBA1C" in the last 72 hours. Lipid Profile No results for input(s): "CHOL", "HDL", "LDLCALC", "TRIG", "CHOLHDL", "LDLDIRECT" in the last 72 hours. Thyroid function studies No results for input(s): "TSH", "T4TOTAL", "T3FREE", "THYROIDAB" in the last  72 hours.  Invalid input(s): "FREET3" Urinalysis    Component Value Date/Time   COLORURINE STRAW (A) 12/07/2023 1337   APPEARANCEUR CLEAR 12/07/2023 1337   LABSPEC 1.010 12/07/2023 1337   PHURINE 7.0 12/07/2023 1337   GLUCOSEU >=500 (A) 12/07/2023 1337   HGBUR NEGATIVE 12/07/2023 1337   BILIRUBINUR NEGATIVE 12/07/2023 1337   KETONESUR NEGATIVE 12/07/2023 1337   PROTEINUR NEGATIVE 12/07/2023 1337   UROBILINOGEN 0.2 03/16/2015 0120   NITRITE NEGATIVE 12/07/2023 1337   LEUKOCYTESUR NEGATIVE 12/07/2023 1337    FURTHER DISCHARGE INSTRUCTIONS:   Get Medicines reviewed and adjusted: Please take all your medications with you for your next visit  with your Primary MD   Laboratory/radiological data: Please request your Primary MD to go over all hospital tests and procedure/radiological results at the follow up, please ask your Primary MD to get all Hospital records sent to his/her office.   In some cases, they will be blood work, cultures and biopsy results pending at the time of your discharge. Please request that your primary care M.D. goes through all the records of your hospital data and follows up on these results.   Also Note the following: If you experience worsening of your admission symptoms, develop shortness of breath, life threatening emergency, suicidal or homicidal thoughts you must seek medical attention immediately by calling 911 or calling your MD immediately  if symptoms less severe.   You must read complete instructions/literature along with all the possible adverse reactions/side effects for all the Medicines you take and that have been prescribed to you. Take any new Medicines after you have completely understood and accpet all the possible adverse reactions/side effects.    Do not drive when taking Pain medications or sleeping medications (Benzodaizepines)   Do not take more than prescribed Pain, Sleep and Anxiety Medications. It is not advisable to combine  anxiety,sleep and pain medications without talking with your primary care practitioner   Special Instructions: If you have smoked or chewed Tobacco  in the last 2 yrs please stop smoking, stop any regular Alcohol   and or any Recreational drug use.   Wear Seat belts while driving.   Please note: You were cared for by a hospitalist during your hospital stay. Once you are discharged, your primary care physician will handle any further medical issues. Please note that NO REFILLS for any discharge medications will be authorized once you are discharged, as it is imperative that you return to your primary care physician (or establish a relationship with a primary care physician if you do not have one) for your post hospital discharge needs so that they can reassess your need for medications and monitor your lab values.  Time coordinating discharge: 35 minutes  SIGNED:  Kathlen Para, MD, PhD 12/10/2023, 7:33 AM

## 2023-12-11 DIAGNOSIS — R55 Syncope and collapse: Principal | ICD-10-CM

## 2023-12-11 DIAGNOSIS — J189 Pneumonia, unspecified organism: Secondary | ICD-10-CM | POA: Diagnosis not present

## 2023-12-11 LAB — COMPREHENSIVE METABOLIC PANEL WITH GFR
ALT: 7 U/L (ref 0–44)
AST: 11 U/L — ABNORMAL LOW (ref 15–41)
Albumin: 2.8 g/dL — ABNORMAL LOW (ref 3.5–5.0)
Alkaline Phosphatase: 74 U/L (ref 38–126)
Anion gap: 9 (ref 5–15)
BUN: 38 mg/dL — ABNORMAL HIGH (ref 8–23)
CO2: 21 mmol/L — ABNORMAL LOW (ref 22–32)
Calcium: 8.1 mg/dL — ABNORMAL LOW (ref 8.9–10.3)
Chloride: 107 mmol/L (ref 98–111)
Creatinine, Ser: 2.28 mg/dL — ABNORMAL HIGH (ref 0.61–1.24)
GFR, Estimated: 29 mL/min — ABNORMAL LOW (ref >60)
Glucose, Bld: 157 mg/dL — ABNORMAL HIGH (ref 70–99)
Potassium: 3.9 mmol/L (ref 3.5–5.1)
Sodium: 137 mmol/L (ref 135–145)
Total Bilirubin: 0.4 mg/dL (ref 0.0–1.2)
Total Protein: 5.9 g/dL — ABNORMAL LOW (ref 6.5–8.1)

## 2023-12-11 LAB — CBC
HCT: 29.9 % — ABNORMAL LOW (ref 39.0–52.0)
Hemoglobin: 9.5 g/dL — ABNORMAL LOW (ref 13.0–17.0)
MCH: 26 pg (ref 26.0–34.0)
MCHC: 31.8 g/dL (ref 30.0–36.0)
MCV: 81.7 fL (ref 80.0–100.0)
Platelets: 361 10*3/uL (ref 150–400)
RBC: 3.66 MIL/uL — ABNORMAL LOW (ref 4.22–5.81)
RDW: 15.8 % — ABNORMAL HIGH (ref 11.5–15.5)
WBC: 11.1 10*3/uL — ABNORMAL HIGH (ref 4.0–10.5)
nRBC: 0 % (ref 0.0–0.2)

## 2023-12-11 LAB — PROCALCITONIN: Procalcitonin: <0.1 ng/mL

## 2023-12-11 LAB — TSH: TSH: 1.159 u[IU]/mL (ref 0.350–4.500)

## 2023-12-11 LAB — GLUCOSE, CAPILLARY
Glucose-Capillary: 167 mg/dL — ABNORMAL HIGH (ref 70–99)
Glucose-Capillary: 206 mg/dL — ABNORMAL HIGH (ref 70–99)
Glucose-Capillary: 142 mg/dL — ABNORMAL HIGH (ref 70–99)

## 2023-12-11 LAB — HEMOGLOBIN A1C
Hgb A1c MFr Bld: 7.6 % — ABNORMAL HIGH (ref 4.8–5.6)
Mean Plasma Glucose: 171.42 mg/dL

## 2023-12-11 LAB — MAGNESIUM: Magnesium: 2 mg/dL (ref 1.7–2.4)

## 2023-12-11 LAB — PHOSPHORUS: Phosphorus: 3 mg/dL (ref 2.5–4.6)

## 2023-12-11 MED ORDER — ATORVASTATIN CALCIUM 40 MG PO TABS: 40.0000 mg | ORAL_TABLET | Freq: Every day | ORAL | Status: DC

## 2023-12-11 MED ORDER — LEVOFLOXACIN 250 MG PO TABS
250.0000 mg | ORAL_TABLET | Freq: Every day | ORAL | Status: AC
Start: 2023-12-12 — End: 2023-12-14

## 2023-12-11 MED ORDER — TRAMADOL HCL 50 MG PO TABS: 25.0000 mg | ORAL_TABLET | Freq: Two times a day (BID) | ORAL | Status: DC | PRN

## 2023-12-11 MED ORDER — HYDRALAZINE HCL 20 MG/ML IJ SOLN
10.0000 mg | INTRAMUSCULAR | Status: DC | PRN
  Administered 2023-12-11: 10 mg via INTRAVENOUS
  Filled 2023-12-11: qty 1

## 2023-12-11 MED ORDER — PANTOPRAZOLE SODIUM 40 MG PO TBEC: 40.0000 mg | DELAYED_RELEASE_TABLET | Freq: Every day | ORAL | Status: DC

## 2023-12-11 MED ORDER — HYDRALAZINE HCL 20 MG/ML IJ SOLN: 15.0000 mg | INTRAMUSCULAR | Status: DC | PRN

## 2023-12-11 MED ORDER — INSULIN GLARGINE 100 UNIT/ML ~~LOC~~ SOLN: 10.0000 [IU] | Freq: Every day | SUBCUTANEOUS | Status: DC

## 2023-12-11 MED ORDER — EMPAGLIFLOZIN 25 MG PO TABS: ORAL_TABLET | ORAL | Status: DC

## 2023-12-11 NOTE — Evaluation (Signed)
 Physical Therapy Evaluation Patient Details Name: [name] MRN: [id number] DOB: 04-15-1946 Today's Date: 12/11/2023  History of Present Illness  a 78 y.o. male with medical history significant for TIA, diabetes, CAD status post PCI CABG, recent hospitalization at [facility] in late February for pneumonia who presents back to the ED for evaluation of a syncope episode after discharge from the hospital earlier this morning. Patient had a 3-day hospitalization for pneumonia and generalized weakness. He was discharged in the morning and reports that he and his spouse was sitting outside in the sun on their deck. Later in the afternoon, he got up to go inside and passed out right away hitting the back of his head on the hard deck. Reports he had drank some [facility] but no water  since he was discharged from the hospital.  He also thinks he likely stood up too fast. He denies any chest pain, lightheadedness, shortness of breath, bloody stools, nausea, vomiting, cough, fevers or chills.  He developed redness in his left eye while in the hospital last night and had a brief blurry vision but this has now resolved.  When EMS arrived, patient was found to have a BP of 80/53 which improved to 136/50 5:03 100 cc bolus.  CBG was 202. (per MD)  Clinical Impression   Pt tolerated today's Physical Therapy Evaluation, well with great carryover for bed mobility and transfers without AD. Pt appears to move close to baseline, which is independent. Unable to determine dynamic balance or ambulation capacity at this time due to elevated BP at 195/71 after transferring to stand. Pt's baseline is independent. Currently, pt demonstrating reduced mobility and mild balance deficits  due to muscle weakness in BLE. Will keep pt on caseload and attempt further ambulation and more dynamic balance but anticipating DC back home and no need for skilled rehab setting before going home.   Based upon these deficits/impairments,  patient will benefit from continued skilled physical therapy services during remainder of hospital stay and at the next recommended venue of care to address deficits and promote return to optimal function.                If plan is discharge home, recommend the following: A little help with walking and/or transfers;A little help with bathing/dressing/bathroom   Can travel by private vehicle        Equipment Recommendations None recommended by PT  Recommendations for Other Services       Functional Status Assessment Patient has had a recent decline in their functional status and demonstrates the ability to make significant improvements in function in a reasonable and predictable amount of time.     Precautions / Restrictions Precautions Precautions: None Restrictions Weight Bearing Restrictions Per Provider Order: No      Mobility  Bed Mobility Overal bed mobility: Modified Independent             General bed mobility comments: Supine BP 195/81 HR 101 Patient Response: Cooperative  Transfers Overall transfer level: Needs assistance   Transfers: Sit to/from Stand Sit to Stand: Supervision           General transfer comment: slow, steady sit to stand from EOB without AD sitting BP 184/84---> standing ; 193/71 HR 104    Ambulation/Gait               General Gait Details: Contraindicated to elevated BP  Stairs            Wheelchair Mobility  Tilt Bed Tilt Bed Patient Response: Cooperative  Modified Rankin (Stroke Patients Only)       Balance Overall balance assessment: Mild deficits observed, not formally tested                                           Pertinent Vitals/Pain Pain Assessment Pain Assessment: No/denies pain    Home Living Family/patient expects to be discharged to:: Private residence Living Arrangements: Spouse/significant other Available Help at Discharge: Family;Available 24 hours/day Type  of Home: House Home Access: Stairs to enter Entrance Stairs-Rails: Right;Left Entrance Stairs-Number of Steps: 5 in front; 3 in back   Home Layout: One level Home Equipment: Agricultural consultant (2 wheels);Shower seat;Cane - single point;Grab bars - toilet;Grab bars - tub/shower      Prior Function Prior Level of Function : Independent/Modified Independent             Mobility Comments: independent without AD ADLs Comments: independent     Extremity/Trunk Assessment   Upper Extremity Assessment Upper Extremity Assessment: Defer to OT evaluation    Lower Extremity Assessment Lower Extremity Assessment: RLE deficits/detail;LLE deficits/detail;Overall WFL for tasks assessed RLE Sensation: WNL LLE Sensation: WNL       Communication        Cognition Arousal: Alert Behavior During Therapy: WFL for tasks assessed/performed   PT - Cognitive impairments: No apparent impairments                         Following commands: Intact       Cueing Cueing Techniques: Verbal cues, Tactile cues     General Comments      Exercises     Assessment/Plan    PT Assessment Patient needs continued PT services  PT Problem List Decreased strength;Decreased activity tolerance;Decreased balance;Decreased mobility       PT Treatment Interventions Gait training;Functional mobility training;Therapeutic activities;Therapeutic exercise;Balance training;Neuromuscular re-education    PT Goals (Current goals can be found in the Care Plan section)  Acute Rehab PT Goals Patient Stated Goal: return home PT Goal Formulation: With patient Time For Goal Achievement: 12/25/23    Frequency Min 2X/week     Co-evaluation               AM-PAC PT "6 Clicks" Mobility  Outcome Measure Help needed turning from your back to your side while in a flat bed without using bedrails?: None Help needed moving from lying on your back to sitting on the side of a flat bed without using  bedrails?: None Help needed moving to and from a bed to a chair (including a wheelchair)?: None Help needed standing up from a chair using your arms (e.g., wheelchair or bedside chair)?: None Help needed to walk in hospital room?: A Little Help needed climbing 3-5 steps with a railing? : A Lot 6 Click Score: 21    End of Session     Patient left: in bed;with call bell/phone within reach;with chair alarm set Nurse Communication: Mobility status PT Visit Diagnosis: Muscle weakness (generalized) (M62.81);History of falling (Z91.81)    Time: 0981-1914 PT Time Calculation (min) (ACUTE ONLY): 17 min   Charges:   PT Evaluation $PT Eval Low Complexity: 1 Low   PT General Charges $$ ACUTE PT VISIT: 1 Visit         Astrid Lay, DPT [facility] Health Outpatient  Rehabilitation- [place] [phone] office  Gatha Kaska 12/11/2023, 11:54 AM

## 2023-12-11 NOTE — TOC Initial Note (Signed)
 Transition of Care [facility]) - Initial/Assessment Note    Patient Details  Name: [name] MRN: [id number] Date of Birth: 1945/09/18  Transition of Care [facility]) CM/SW Contact:    Orelia Binet, RN Phone Number: 12/11/2023, 4:00 PM  Clinical Narrative:          Patient readmitted after discharging home and syncope with collapse. [facility] notification done  [phone]. Patient in OBS, TOC following.   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   H/O adenomatous polyp of colon 11/22/2022   Aortic stenosis 05/30/2022   Coronary artery disease involving coronary bypass graft of native heart without angina pectoris 11/15/2021   Pain and swelling of right elbow 04/23/2021   Fall    Closed nondisplaced intertrochanteric fracture of left femur (HCC)    Closed left hip fracture, initial encounter (HCC) 04/01/2021   CAP (community acquired pneumonia) 05/21/2019   Diarrhea 03/18/2019   Loss of weight 03/18/2019   Klebsiella sepsis (HCC) 09/15/2018   Urinary tract infection without hematuria    Acute lower UTI 09/13/2018   S/P colostomy takedown 03/26/2018   Hypertension 12/12/2017   Type 2 diabetes mellitus with diabetic neuropathy (HCC) 12/12/2017   CKD (chronic kidney disease), stage III (HCC) 12/12/2017   Hypercholesteremia 12/12/2017   Hypokalemia 12/12/2017   GI bleed 12/06/2017   Rectal bleed 12/06/2017   Uncontrolled type 2 diabetes mellitus with hyperglycemia, with long-term current use of insulin  (HCC) 12/06/2017   Acute renal failure superimposed on stage 3 chronic kidney disease (HCC) 12/06/2017   Malnutrition of  moderate degree 12/06/2017   Abnormal CT scan, colon 11/23/2017   Constipation 11/23/2017   Lobar pneumonia (HCC) 10/02/2017   Diverticulitis large intestine 08/19/2017   Diverticulitis of large intestine with perforation and abscess without bleeding    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 08/16/2017   CKD (chronic kidney disease) stage 3, GFR 30-59 ml/min (HCC) 08/02/2017   Sepsis due to undetermined organism (HCC) 08/01/2017   GERD (gastroesophageal reflux disease) 08/01/2017   PAD (peripheral artery disease) (HCC) 08/09/2016   Claudication (HCC) 05/08/2016   HCAP (healthcare-associated pneumonia) 01/12/2016   Hyponatremia 01/12/2016   Abnormal chest CT 10/13/2015   Hx of CABG 10/13/2015   Type 2 diabetes, HbA1c goal < 7% (HCC)    Unstable angina (HCC)    Hypomagnesemia    Type 2 diabetes mellitus (HCC) 10/09/2015   ASCVD (arteriosclerotic cardiovascular disease) 10/09/2015   CKD (chronic kidney disease) stage 2, GFR 60-89 ml/min 10/09/2015   Sepsis (HCC) 07/09/2015   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 07/09/2015   PVD (peripheral vascular disease) (HCC) 07/09/2015   Essential hypertension 07/09/2015   Orthostatic hypotension 07/09/2015   Neuropathy, peripheral 07/09/2015   HLD (hyperlipidemia) 07/09/2015   AKI (acute kidney injury) (HCC) 07/09/2015   CAD S/P percutaneous coronary angioplasty 08/14/1996   PCP:  Clinic, [name] Pharm[facility]:   [facility] [address], [place] - [address] [place] [address] Phone: [phone] Fax: [phone]  [facility] - [place], [place] - [address] [place] [address] Phone: [phone] Fax: [phone]  [facility] [address] [place] [address] Phone: [phone] Fax: [phone]     Social Drivers of Health (SDOH) Social History: SDOH Screenings   Food Insecurity: No Food Insecurity (12/11/2023)  Housing: Low Risk   (12/11/2023)  Transportation Needs: No Transportation Needs (12/11/2023)  Utilities: Not At Risk (12/11/2023)  Financial Resource Strain: Low Risk  (10/08/2023)   [facility] from [facility]  Social Connections: Socially Integrated (12/11/2023)  Tobacco Use: Medium Risk (12/07/2023)  Health Literacy: High Risk (10/08/2023)   [facility] from [facility]   SDOH Interventions:

## 2023-12-11 NOTE — Plan of Care (Signed)

## 2023-12-11 NOTE — Plan of Care (Signed)
  Problem: Acute Rehab PT Goals(only PT should resolve) Goal: Pt Will Perform Standing Balance Or Pre-Gait Flowsheets (Taken 12/11/2023 1158) Pt will perform standing balance or pre-gait: Independently Goal: Pt Will Ambulate Flowsheets (Taken 12/11/2023 1158) Pt will Ambulate:  75 feet  100 feet  Independently  with least restrictive assistive device  Gatha Kaska PT, DPT [facility] Health Outpatient Rehabilitation- [place] [phone] office

## 2023-12-11 NOTE — Hospital Course (Signed)
 78 y.o. male with medical history significant for TIA, diabetes, CAD status post PCI CABG, recent hospitalization at [facility] in late February for pneumonia who presents back to the ED for evaluation of a syncope episode after discharge from the hospital earlier this morning. Patient had a 3-day hospitalization for pneumonia and generalized weakness. He was discharged in the morning and reports that he and his spouse was sitting outside in the sun on their deck. Later in the afternoon, he got up to go inside and passed out right away hitting the back of his head on the hard deck. Reports he had drank some [facility] but no water  since he was discharged from the hospital.  He also thinks he likely stood up too fast. He denies any chest pain, lightheadedness, shortness of breath, bloody stools, nausea, vomiting, cough, fevers or chills.  He developed redness in his left eye while in the hospital last night and had a brief blurry vision but this has now resolved.  When EMS arrived, patient was found to have a BP of 80/53 which improved to 136/50 5:03 100 cc bolus.  CBG was 202.   ED Course: Initial vitals showed temp 98.5, RR 15, HR 91, BP 145/60, SpO2 97% on room air.  Labs significant for WBC 13.2, Hgb 8.2, sodium 135, K+ 2.8, bicarb 18, glucose 156, BUN/creatinine 37/2.20, calcium  6.7, albumin  2.4, D-dimer 1.27, troponin 3->3, negative fecal occult test.  EKG shows normal sinus with slightly prolonged PR interval and depressed T waves. CXR showed bilateral mild to lower lung field atelectasis versus infiltrate.  CT head with no acute intracranial abnormality. CTA negative for PE.  Patient received tetracaine ophthalmic solution for evaluation of the left eye.  He was given IV Rocephin , IV azithromycin , calcium  carbonate 2500 mg x 1, and KCl 40 mEq x 1. TRH was consulted for admission

## 2023-12-11 NOTE — Discharge Summary (Signed)
 Physician Discharge Summary  [name] HQI:[id number] DOB: 1946/06/13 DOA: 12/10/2023  PCP: [name]  Admit date: 12/10/2023 Discharge date: 12/11/2023  Admitted From:  HOME  Disposition:  HOME   Recommendations for Outpatient Follow-up:  Follow up with PCP in 1 weeks  Home Health: outpatient PT   Discharge Condition: STABLE   CODE STATUS: FULL DIET: heart healthy foods with low sodium recommended    Brief Hospitalization Summary: Please see all hospital notes, images, labs for full details of the hospitalization. Admission provider HPI:  78 y.o. male with medical history significant for TIA, diabetes, CAD status post PCI CABG, recent hospitalization at [facility] in late February for pneumonia who presents back to the ED for evaluation of a syncope episode after discharge from the hospital earlier this morning. Patient had a 3-day hospitalization for pneumonia and generalized weakness. He was discharged in the morning and reports that he and his spouse was sitting outside in the sun on their deck. Later in the afternoon, he got up to go inside and passed out right away hitting the back of his head on the hard deck. Reports he had drank some [facility] but no water  since he was discharged from the hospital.  He also thinks he likely stood up too fast. He denies any chest pain, lightheadedness, shortness of breath, bloody stools, nausea, vomiting, cough, fevers or chills.  He developed redness in his left eye while in the hospital last night and had a brief blurry vision but this has now resolved.  When EMS arrived, patient was found to have a BP of 80/53 which improved to 136/50 5:03 100 cc bolus.  CBG was 202.   ED Course: Initial vitals showed temp 98.5, RR 15, HR 91, BP 145/60, SpO2 97% on room air.  Labs significant for WBC 13.2, Hgb 8.2, sodium 135, K+ 2.8, bicarb 18, glucose 156, BUN/creatinine 37/2.20, calcium  6.7, albumin  2.4, D-dimer 1.27, troponin 3->3, negative  fecal occult test.  EKG shows normal sinus with slightly prolonged PR interval and depressed T waves. CXR showed bilateral mild to lower lung field atelectasis versus infiltrate.  CT head with no acute intracranial abnormality. CTA negative for PE.  Patient received tetracaine ophthalmic solution for evaluation of the left eye.  He was given IV Rocephin , IV azithromycin , calcium  carbonate 2500 mg x 1, and KCl 40 mEq x 1. TRH was consulted for admission  Hospital Course Narrative Pt was readmitted on 4/28 after having been discharged earlier in the day.  He was found to be orthostically hypotensive after tests and he was treated with IV fluid hydration and he was re-evaluated after hydration and no longer had orthostatic vitals.  He was evaluated by PT and outpatient PT was recommended.  He is feeling much better and eager to go home.  He was monitored for an additional few hours and he is wanting to go home.  He is stable.  DC home in stable condition with close outpatient follow up.  Wife updated on plan of care.  His procalcitonin was less than 0.10 and I don't think he has any more pneumonia.    Discharge Diagnoses:  Principal Problem:   Syncope and collapse Active Problems:   Orthostatic hypotension   Multifocal pneumonia   Hypocalcemia   Anemia   Discharge Instructions: Discharge Instructions     Ambulatory referral to Physical Therapy   Complete by: As directed       Allergies as of 12/11/2023  Reactions   Acarbose Diarrhea        Medication List     STOP taking these medications    metFORMIN  1000 MG tablet Commonly known as: GLUCOPHAGE        TAKE these medications    amLODipine  10 MG tablet Commonly known as: NORVASC  Take 10 mg by mouth in the morning.   aspirin  EC 81 MG tablet Take 1 tablet (81 mg total) by mouth daily with breakfast.   atorvastatin  40 MG tablet Commonly known as: LIPITOR  Take 1 tablet (40 mg total) by mouth at bedtime.   baclofen 10  MG tablet Commonly known as: LIORESAL Take 10 mg by mouth at bedtime.   cilostazol  100 MG tablet Commonly known as: PLETAL  Take 100 mg by mouth 2 (two) times daily.   clopidogrel  75 MG tablet Commonly known as: PLAVIX  Take 75 mg by mouth in the morning.   cyanocobalamin  1000 MCG tablet Commonly known as: VITAMIN B12 Take 1,000 mcg by mouth in the morning.   empagliflozin  25 MG Tabs tablet Commonly known as: JARDIANCE  12.5 mg daily as directed What changed:  how much to take how to take this when to take this additional instructions   gabapentin  600 MG tablet Commonly known as: NEURONTIN  Take 600 mg by mouth 2 (two) times daily.   insulin  glargine 100 UNIT/ML injection Commonly known as: LANTUS  Inject 0.1 mLs (10 Units total) into the skin at bedtime. What changed: how much to take   levofloxacin  250 MG tablet Commonly known as: Levaquin  Take 1 tablet (250 mg total) by mouth daily for 2 days. Start taking on: December 12, 2023 What changed:  how much to take when to take this   losartan  50 MG tablet Commonly known as: COZAAR  Take 50 mg by mouth daily.   metoprolol  tartrate 50 MG tablet Commonly known as: LOPRESSOR  Take 25 mg by mouth 2 (two) times daily.   pantoprazole  40 MG tablet Commonly known as: PROTONIX  Take 1 tablet (40 mg total) by mouth daily.   traMADol  50 MG tablet Commonly known as: Ultram  Take 0.5 tablets (25 mg total) by mouth every 12 (twelve) hours as needed for severe pain (pain score 7-10). What changed: when to take this        Follow-up Information     [facility] Schedule an appointment as soon as possible for a visit in 1 week(s).   Why: Hospital Follow Up Contact information: [address] [facility] [place] [address] [phone]                Allergies  Allergen Reactions   Acarbose Diarrhea   Allergies as of 12/11/2023       Reactions   Acarbose Diarrhea        Medication List      STOP taking these medications    metFORMIN  1000 MG tablet Commonly known as: GLUCOPHAGE        TAKE these medications    amLODipine  10 MG tablet Commonly known as: NORVASC  Take 10 mg by mouth in the morning.   aspirin  EC 81 MG tablet Take 1 tablet (81 mg total) by mouth daily with breakfast.   atorvastatin  40 MG tablet Commonly known as: LIPITOR  Take 1 tablet (40 mg total) by mouth at bedtime.   baclofen 10 MG tablet Commonly known as: LIORESAL Take 10 mg by mouth at bedtime.   cilostazol  100 MG tablet Commonly known as: PLETAL  Take 100 mg by mouth 2 (two) times daily.  clopidogrel  75 MG tablet Commonly known as: PLAVIX  Take 75 mg by mouth in the morning.   cyanocobalamin  1000 MCG tablet Commonly known as: VITAMIN B12 Take 1,000 mcg by mouth in the morning.   empagliflozin  25 MG Tabs tablet Commonly known as: JARDIANCE  12.5 mg daily as directed What changed:  how much to take how to take this when to take this additional instructions   gabapentin  600 MG tablet Commonly known as: NEURONTIN  Take 600 mg by mouth 2 (two) times daily.   insulin  glargine 100 UNIT/ML injection Commonly known as: LANTUS  Inject 0.1 mLs (10 Units total) into the skin at bedtime. What changed: how much to take   levofloxacin  250 MG tablet Commonly known as: Levaquin  Take 1 tablet (250 mg total) by mouth daily for 2 days. Start taking on: December 12, 2023 What changed:  how much to take when to take this   losartan  50 MG tablet Commonly known as: COZAAR  Take 50 mg by mouth daily.   metoprolol  tartrate 50 MG tablet Commonly known as: LOPRESSOR  Take 25 mg by mouth 2 (two) times daily.   pantoprazole  40 MG tablet Commonly known as: PROTONIX  Take 1 tablet (40 mg total) by mouth daily.   traMADol  50 MG tablet Commonly known as: Ultram  Take 0.5 tablets (25 mg total) by mouth every 12 (twelve) hours as needed for severe pain (pain score 7-10). What changed: when to take  this        Procedures/Studies: CT Angio Chest PE W and/or Wo Contrast Result Date: 12/10/2023 CLINICAL DATA:  High probability pulmonary embolism, pneumonia, syncope EXAM: CT ANGIOGRAPHY CHEST WITH CONTRAST TECHNIQUE: Multidetector CT imaging of the chest was performed using the standard protocol during bolus administration of intravenous contrast. Multiplanar CT image reconstructions and MIPs were obtained to evaluate the vascular anatomy. RADIATION DOSE REDUCTION: This exam was performed according to the departmental dose-optimization program which includes automated exposure control, adjustment of the mA and/or kV according to patient size and/or use of iterative reconstruction technique. CONTRAST:  60mL OMNIPAQUE  IOHEXOL  350 MG/ML SOLN COMPARISON:  12/07/2023 FINDINGS: Cardiovascular: Status post coronary artery bypass grafting. Global cardiac size within limits. No pericardial effusion. Central pulmonary arteries are of normal caliber. No intraluminal filling defect identified through the segmental level to suggest acute pulmonary embolism. Extensive atherosclerotic calcification within the thoracic aorta. No aortic aneurysm. Mediastinum/Nodes: No enlarged mediastinal, hilar, or axillary lymph nodes. Thyroid gland, trachea, and esophagus demonstrate no significant findings. Lungs/Pleura: Multifocal pulmonary infiltrates are again identified, slightly improved within the left upper lobe, but progressive within the right upper lobe in keeping with changes of multifocal pneumonia. Left lower lobe platelike atelectasis. No pneumothorax or pleural effusion. No central obstructing lesion. Upper Abdomen: Cholelithiasis without pericholecystic inflammatory change. No acute abnormality within visualized upper abdomen. 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 Musculoskeletal: Multiple lytic lesions are again  identified within the T7 and T9 vertebral bodies, with pathologic fracture of the T9 vertebral body, and right 4 and 7 ribs, pathologic fracture right seventh rib. The findings are in keeping with with metastatic disease, less likely multiple myeloma. These are better assessed on MRI examination 12/07/2023. Review of the MIP images confirms the above findings. IMPRESSION: 1. No acute pulmonary embolism. 2. Multifocal pulmonary infiltrates, slightly improved within the left upper lobe, but progressive within the right upper lobe in keeping with changes of multifocal pneumonia. 3. Multiple lytic lesions within the T7 and T9  vertebral bodies, with pathologic fracture of the T9 vertebral body, and right 4 and 7 ribs, and pathologic fracture of the right seventh rib. The findings are in keeping with metastatic disease, less likely multiple myeloma. These are better assessed on MRI examination 12/07/2023. 4. 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 Clinical correlation for signs and symptoms chronic mesenteric ischemia recommended. If indicated, this could be better assessed with dedicated CT arteriography. 5. Cholelithiasis. Electronically Signed   By: Worthy Heads M.D.   On: 12/10/2023 20:19   CT Head Wo Contrast Result Date: 12/10/2023 CLINICAL DATA:  Head trauma, minor (Age >= 65y).  Fall from chair. EXAM: CT HEAD WITHOUT CONTRAST TECHNIQUE: Contiguous axial images were obtained from the base of the skull through the vertex without intravenous contrast. RADIATION DOSE REDUCTION: This exam was performed according to the departmental dose-optimization program which includes automated exposure control, adjustment of the mA and/or kV according to patient size and/or use of iterative reconstruction technique. COMPARISON:  Head CT 09/13/2018 and MRI 03/29/2018 FINDINGS: Brain: There is no evidence of an acute infarct,  intracranial hemorrhage, mass, midline shift, or extra-axial fluid collection. There is mild cerebral atrophy. A normal variant cavum septum pellucidum et vergae is noted. A lacunar infarct in the right corona radiata is new but chronic in appearance. Mild hypodensities elsewhere in the cerebral white matter bilaterally are nonspecific but compatible with chronic small vessel ischemia. Vascular: Calcified atherosclerosis at the skull base. No hyperdense vessel. Skull: No acute fracture or suspicious lesion. Sinuses/Orbits: Visualized paranasal sinuses are clear. Moderately large right mastoid effusion. Unremarkable orbits. Other: None. IMPRESSION: 1. No evidence of acute intracranial abnormality. 2. Mild chronic small vessel ischemia. Electronically Signed   By: Aundra Lee M.D.   On: 12/10/2023 20:10   DG Chest Portable 1 View Result Date: 12/10/2023 CLINICAL DATA:  Recent pneumonia.  Syncope. EXAM: PORTABLE CHEST 1 VIEW COMPARISON:  Chest radiograph dated 12/07/2023. FINDINGS: Shallow inspiration. Faint bilateral mid to lower lung field interstitial densities may represent atelectasis or infiltrate. No consolidative changes. No large pleural effusion or pneumothorax. Stable cardiac silhouette. Median sternotomy wires and CABG vascular clips. No acute osseous pathology. IMPRESSION: Shallow inspiration with bilateral mid to lower lung field atelectasis or infiltrate. Electronically Signed   By: Angus Bark M.D.   On: 12/10/2023 17:53   MR THORACIC SPINE W WO CONTRAST Result Date: 12/07/2023 CLINICAL DATA:  Thoracic spine compression fracture. Back pain. Decreased appetite. EXAM: MRI THORACIC WITHOUT AND WITH CONTRAST TECHNIQUE: Multiplanar and multiecho pulse sequences of the thoracic spine were obtained without and with intravenous contrast. CONTRAST:  6mL GADAVIST  GADOBUTROL  1 MMOL/ML IV SOLN COMPARISON:  CT chest without contrast 12/07/2023. MRI of the thoracic spine without and with contrast  02/22/2021 FINDINGS: Alignment: No significant listhesis is present. Leftward curvature of the thoracic spine is centered at T8-9. Vertebrae: Pathologic compression fracture is confirmed at T9-10. Abnormal enhancement extending extends into an expanded right pedicle. Prominent lesion across the inferior right lateral aspect of the T7 vertebral body measures 2.2 x 1.4 cm. Extensive enhancement encompasses the majority of the T9 vertebral body with some sparing on the left. 11 mm rounded enhancing lesion is present in the left T4. Multiple lesions are present at T2, the largest anteriorly on the left along the inferior endplate measuring 11 mm. No other pathologic fractures are present. No significant extraosseous tumor is present. Multiple rib lesions are present bilaterally without a  pathologic rib fracture. Cord: Central T2 hyperintensities noted in the cord at the T2-3 level. No other cord signal changes present. Cord morphology is within normal limits. Paraspinal and other soft tissues: The paraspinous soft tissues are within normal limits. The visualized lung fields are clear. No primary lesion is identified. Disc levels: No significant central disc protrusion or central canal stenosis is present. Facet hypertrophy contributes 2 right greater than left foraminal stenosis from T8-9 through T11-12. Moderate right foraminal stenosis is also present at T7-8. IMPRESSION: 1. Pathologic compression fracture at T9-10 with abnormal enhancement extending into an expanded right pedicle. 2. Multiple enhancing lesions throughout the thoracic spine as described compatible with metastatic disease or multiple myeloma. 3. Multiple rib lesions bilaterally without a pathologic rib fracture. 4. Central T2 hyperintensity in the cord at the T2-3 level is nonspecific but may represent a small focus of myelomalacia. 5. No significant central canal stenosis. 6. Facet hypertrophy contributes to right greater than left foraminal stenosis  from T8-9 through T11-12. Moderate right foraminal stenosis is also present at T7-8. These results were called by telephone at the time of interpretation on 12/07/2023 at 6:31 pm to provider JULIE HAVILAND , who verbally acknowledged these results. Electronically Signed   By: Audree Leas M.D.   On: 12/07/2023 18:31   MR Lumbar Spine W Wo Contrast Result Date: 12/07/2023 CLINICAL DATA:  Back pain and chills. EXAM: MRI LUMBAR SPINE WITHOUT AND WITH CONTRAST TECHNIQUE: Multiplanar and multiecho pulse sequences of the lumbar spine were obtained without and with intravenous contrast. CONTRAST:  6mL GADAVIST  GADOBUTROL  1 MMOL/ML IV SOLN COMPARISON:  CT of the abdomen and pelvis without contrast 04/13/2023 FINDINGS: Segmentation: 5 non rib-bearing lumbar type vertebral bodies are present. The lowest fully formed vertebral body is L5. Alignment: No significant listhesis is present. Rightward curvature of the lumbar spine is centered at L3. Vertebrae: Multiple enhancing lesions are present throughout the lumbar spine. Lesion anteriorly on the left subjacent to the superior endplate of L3 measures 3.1 x 2.6 x 1.7 cm. And a lesion inferiorly in anteriorly on the right measures 1.8 x 1.5 x 1.8 cm. No pathologic fractures are present. Multiple other smaller lesions are present throughout the lumbar spine, visualized sacrum and iliac bones. Conus medullaris and cauda equina: Conus extends to the L1 level. Conus and cauda equina appear normal. Paraspinal and other soft tissues: Simple cysts are present in the pole of both kidneys. The largest is on the right measuring 3 cm. Atherosclerotic changes are present in the aorta and branch vessels. Abnormal signal in the proximal left iliac artery raises concern for occlusion or slow flow. No significant adenopathy is present. No primary lesion is identified. Disc levels: L1-2: Mild disc bulging and facet hypertrophy is present. No significant stenosis is present. L2-3: A  broad-based disc protrusion is asymmetric to the left. Mild facet hypertrophy is noted bilaterally. Mild left subarticular and moderate left foraminal stenosis is present. L3-4: A broad-based disc protrusion is present. Moderate facet hypertrophy is worse on the right. This results in mild subarticular and moderate foraminal stenosis bilaterally, right greater than left. L4-5: A broad-based disc protrusion is present. Moderate facet hypertrophy is worse on the right. Moderate to severe central canal stenosis is present with right greater than left subarticular narrowing. Moderate foraminal stenosis is worse on the left. L5-S1: A shallow right paramedian disc protrusion is present. This potentially contacts the traversing right L5 nerve roots. Facet spurring contributes to moderate right foraminal stenosis. IMPRESSION: 1. Multiple  enhancing lesions throughout the lumbar spine, visualized sacrum and iliac bones compatible with metastatic disease or multiple myeloma. 2. No pathologic fractures. 3. Abnormal signal in the proximal left iliac artery raises concern for occlusion or slow flow. 4. 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 5. Mild left subarticular and moderate left foraminal stenosis at L2-3. 6. Mild subarticular and moderate foraminal stenosis bilaterally at L3-4, right greater than left. 7. Moderate to severe central canal stenosis at L4-5 with right greater than left subarticular narrowing. 8. Moderate foraminal stenosis bilaterally at L4-5 is worse on the left. 9. Shallow right paramedian disc protrusion at L5-S1 potentially contacts the traversing right L5 nerve roots. 10. Moderate right foraminal stenosis at L5-S1. These results were called by telephone at the time of interpretation on 12/07/2023 at 5:57 Pm to provider [facility], who verbally acknowledged these results. Electronically Signed   By: Audree Leas M.D.   On: 12/07/2023 18:17   CT Chest Wo Contrast Result Date:  12/07/2023 CLINICAL DATA:  Back pain, weakness, decreased appetite EXAM: CT CHEST WITHOUT CONTRAST TECHNIQUE: Multidetector CT imaging of the chest was performed following the standard protocol without IV contrast. RADIATION DOSE REDUCTION: This exam was performed according to the departmental dose-optimization program which includes automated exposure control, adjustment of the mA and/or kV according to patient size and/or use of iterative reconstruction technique. COMPARISON:  12/07/2023, 08/28/2023 FINDINGS: Cardiovascular: Unenhanced imaging of the heart is unremarkable without pericardial effusion. 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 Dense atherosclerosis of the native coronary vasculature. Normal caliber of the thoracic aorta. Extensive atherosclerosis of the aorta and its branches. Assessment of the vascular lumen cannot be performed without IV contrast. Mediastinum/Nodes: No enlarged mediastinal or axillary lymph nodes. Thyroid gland, trachea, and esophagus demonstrate no significant findings. Lungs/Pleura: Chronic elevation of the left hemidiaphragm, with mild left lower lobe bronchiectasis and scarring again noted. There are also linear opacities within the left upper lobe consistent with scarring seen previously. However, there is some new patchy nodular airspace disease within the bilateral upper lobes, left greater than right, concerning for superimposed pneumonia. No effusion or pneumothorax. Central airways are patent. Upper Abdomen: Cholelithiasis without evidence of acute cholecystitis. No other acute upper abdominal findings. Musculoskeletal: The mixed sclerotic and lucent lesion within the T9 vertebral body with associated right paraspinal soft tissue component is again identified, measuring 2.4 x 2.5 cm reference image 98/2. Since the previous exam, it appears that there is slight invagination of the superior endplate of the T9 vertebral body on the right, consistent  with superimposed pathologic fracture. Less than 10% loss of height. No retropulsion. Further evaluation with MRI may be useful given symptoms of back pain. There is a healing subacute to chronic right posterolateral seventh rib fracture with abundant callus formation, new since prior CT. No other acute bony abnormalities. Reconstructed images demonstrate no additional findings. IMPRESSION: 1. New fracture through the superior endplate of the T9 vertebral body at the site of the mixed sclerotic and lucent lesion seen on prior CT and MRI exams, consistent with pathologic fracture. Further evaluation with MRI may be useful. 2. Patchy bilateral upper lobe nodular airspace disease consistent with inflammatory or infectious etiology. 3. Chronic areas of scarring or atelectasis within the left upper and left lower lobes, with stable elevation of the left hemidiaphragm. 4.  Aortic Atherosclerosis (ICD10-I70.0). Electronically Signed   By: Bobbye Burrow M.D.   On: 12/07/2023 16:39   DG Chest Portable 1 View Result Date: 12/07/2023 CLINICAL  DATA:  Cough, back pain EXAM: PORTABLE CHEST - 1 VIEW COMPARISON:  08/09/2023 FINDINGS: Low lung volumes. Chronic linear scarring or atelectasis laterally in the left mid lung. No new infiltrate or overt edema. Heart size and mediastinal contours are within normal limits. Aortic Atherosclerosis (ICD10-170.0). CABG markers. No effusion. Sternotomy wires. IMPRESSION: Low volumes with chronic left mid lung scarring or atelectasis. Electronically Signed   By: Nicoletta Barrier M.D.   On: 12/07/2023 13:29     Subjective: Pt says he is feeling a whole lot better after hydration and he has been ambulating with PT with no difficulty.  He is eager to go home with his wife today.  He is agreeable to outpatient PT   Discharge Exam: Vitals:   12/11/23 1145 12/11/23 1320  BP: (!) 190/89 (!) 171/77  Pulse:  (!) 102  Resp:  16  Temp:  97.6 F (36.4 C)  SpO2:  96%   Vitals:   12/11/23 0332  12/11/23 0621 12/11/23 1145 12/11/23 1320  BP: (!) 162/64 (!) 175/64 (!) 190/89 (!) 171/77  Pulse: 94 97  (!) 102  Resp: 15 16  16   Temp: 97.6 F (36.4 C) 98 F (36.7 C)  97.6 F (36.4 C)  TempSrc: Oral Oral  Oral  SpO2: 95% 96%  96%  Weight:      Height:       General: Pt is alert, awake, not in acute distress Cardiovascular: normal S1/S2 +, no rubs, no gallops Respiratory: CTA bilaterally, no wheezing, no rhonchi Abdominal: Soft, NT, ND, bowel sounds + Extremities: no edema, no cyanosis   The results of significant diagnostics from this hospitalization (including imaging, microbiology, ancillary and laboratory) are listed below for reference.     Microbiology: Recent Results (from the past 240 hours)  Resp panel by RT-PCR (RSV, Flu A&B, Covid) Anterior Nasal Swab     Status: None   Collection Time: 12/07/23  1:50 PM   Specimen: Anterior Nasal Swab  Result Value Ref Range Status   SARS Coronavirus 2 by RT PCR NEGATIVE NEGATIVE Final    Comment: (NOTE) SARS-CoV-2 target nucleic acids are NOT DETECTED.  The SARS-CoV-2 RNA is generally detectable in upper respiratory specimens during the acute phase of infection. The lowest concentration of SARS-CoV-2 viral copies this assay can detect is 138 copies/mL. A negative result does not preclude SARS-Cov-2 infection and should not be used as the sole basis for treatment or other patient management decisions. A negative result may occur with  improper specimen collection/handling, submission of specimen other than nasopharyngeal swab, presence of viral mutation(s) within the areas targeted by this assay, and inadequate number of viral copies(<138 copies/mL). A negative result must be combined with clinical observations, patient history, and epidemiological information. The expected result is Negative.  Fact Sheet for Patients:  BloggerCourse.com  Fact Sheet for Healthcare Providers:   SeriousBroker.it  This test is no t yet approved or cleared by the [place]  FDA and  has been authorized for detection and/or diagnosis of SARS-CoV-2 by FDA under an Emergency Use Authorization (EUA). This EUA will remain  in effect (meaning this test can be used) for the duration of the COVID-19 declaration under Section 564(b)(1) of the Act, 21 U.S.C.section 360bbb-3(b)(1), unless the authorization is terminated  or revoked sooner.       Influenza A by PCR NEGATIVE NEGATIVE Final   Influenza B by PCR NEGATIVE NEGATIVE Final    Comment: (NOTE) The Xpert Xpress SARS-CoV-2/FLU/RSV plus assay is intended as  an aid in the diagnosis of influenza from Nasopharyngeal swab specimens and should not be used as a sole basis for treatment. Nasal washings and aspirates are unacceptable for Xpert Xpress SARS-CoV-2/FLU/RSV testing.  Fact Sheet for Patients: BloggerCourse.com  Fact Sheet for Healthcare Providers: SeriousBroker.it  This test is not yet approved or cleared by the [place]  FDA and has been authorized for detection and/or diagnosis of SARS-CoV-2 by FDA under an Emergency Use Authorization (EUA). This EUA will remain in effect (meaning this test can be used) for the duration of the COVID-19 declaration under Section 564(b)(1) of the Act, 21 U.S.C. section 360bbb-3(b)(1), unless the authorization is terminated or revoked.     Resp Syncytial Virus by PCR NEGATIVE NEGATIVE Final    Comment: (NOTE) Fact Sheet for Patients: BloggerCourse.com  Fact Sheet for Healthcare Providers: SeriousBroker.it  This test is not yet approved or cleared by the [place]  FDA and has been authorized for detection and/or diagnosis of SARS-CoV-2 by FDA under an Emergency Use Authorization (EUA). This EUA will remain in effect (meaning this test can be used) for  the duration of the COVID-19 declaration under Section 564(b)(1) of the Act, 21 U.S.C. section 360bbb-3(b)(1), unless the authorization is terminated or revoked.  Performed at [facility], [address] [place], [place] [address]   Culture, blood (routine x 2)     Status: None (Preliminary result)   Collection Time: 12/07/23  5:52 PM   Specimen: BLOOD RIGHT HAND  Result Value Ref Range Status   Specimen Description BLOOD RIGHT HAND  Final   Special Requests   Final    BOTTLES DRAWN AEROBIC AND ANAEROBIC Blood Culture adequate volume   Culture   Final    NO GROWTH 4 DAYS Performed at [facility], [address] [place], [place] [address]    Report Status PENDING  Incomplete  Culture, blood (routine x 2)     Status: None (Preliminary result)   Collection Time: 12/07/23  6:11 PM   Specimen: Left Antecubital; Blood  Result Value Ref Range Status   Specimen Description LEFT ANTECUBITAL  Final   Special Requests   Final    BOTTLES DRAWN AEROBIC AND ANAEROBIC Blood Culture adequate volume   Culture   Final    NO GROWTH 4 DAYS Performed at [facility], [address], [place], [place] [address]    Report Status PENDING  Incomplete     Labs: BNP (last 3 results) Recent Labs    12/10/23 1700  BNP 30.0   Basic Metabolic Panel: Recent Labs  Lab 12/07/23 1349 12/08/23 0452 12/10/23 0452 12/10/23 1700 12/11/23 0420  NA 137 137 138 135 137  K 3.8 4.6 3.7 2.8* 3.9  CL 102 102 102 107 107  CO2 22 23 25  18* 21*  GLUCOSE 112* 147* 124* 156* 157*  BUN 38* 39* 40* 37* 38*  CREATININE 2.52* 2.30* 2.44* 2.20* 2.28*  CALCIUM  9.4 8.8* 8.7* 6.7* 8.1*  MG  --   --   --   --  2.0  PHOS  --   --   --   --  3.0   Liver Function Tests: Recent Labs  Lab 12/07/23 1349 12/10/23 1700 12/11/23 0420  AST 12* 10* 11*  ALT 11 8 7   ALKPHOS 95 66 74  BILITOT 0.6 0.3 0.4  PROT 7.3 5.2* 5.9*  ALBUMIN  3.5 2.4* 2.8*   No results for input(s): "LIPASE", "AMYLASE" in the last 168  hours. No results for input(s): "AMMONIA"  in the last 168 hours. CBC: Recent Labs  Lab 12/07/23 1349 12/08/23 0452 12/10/23 0452 12/10/23 1700 12/11/23 0420  WBC 11.9* 12.4* 10.6* 13.2* 11.1*  NEUTROABS 9.3*  --   --  11.3*  --   HGB 12.0* 11.1* 10.7* 8.2* 9.5*  HCT 37.5* 34.4* 33.7* 25.8* 29.9*  MCV 81.0 81.5 81.8 82.4 81.7  PLT 449* 426* 390 314 361   Cardiac Enzymes: Recent Labs  Lab 12/07/23 1349  CKTOTAL 45*   BNP: Invalid input(s): "POCBNP" CBG: Recent Labs  Lab 12/08/23 2223 12/10/23 2329 12/11/23 0737 12/11/23 1108 12/11/23 1556  GLUCAP 116* 123* 167* 206* 142*   D-Dimer Recent Labs    12/10/23 1700  DDIMER 1.27*   Hgb A1c Recent Labs    12/11/23 0420  HGBA1C 7.6*   Lipid Profile No results for input(s): "CHOL", "HDL", "LDLCALC", "TRIG", "CHOLHDL", "LDLDIRECT" in the last 72 hours. Thyroid function studies Recent Labs    12/11/23 0420  TSH 1.159   Anemia work up No results for input(s): "VITAMINB12", "FOLATE", "FERRITIN", "TIBC", "IRON ", "RETICCTPCT" in the last 72 hours. Urinalysis    Component Value Date/Time   COLORURINE STRAW (A) 12/07/2023 1337   APPEARANCEUR CLEAR 12/07/2023 1337   LABSPEC 1.010 12/07/2023 1337   PHURINE 7.0 12/07/2023 1337   GLUCOSEU >=500 (A) 12/07/2023 1337   HGBUR NEGATIVE 12/07/2023 1337   BILIRUBINUR NEGATIVE 12/07/2023 1337   KETONESUR NEGATIVE 12/07/2023 1337   PROTEINUR NEGATIVE 12/07/2023 1337   UROBILINOGEN 0.2 03/16/2015 0120   NITRITE NEGATIVE 12/07/2023 1337   LEUKOCYTESUR NEGATIVE 12/07/2023 1337   Sepsis Labs Recent Labs  Lab 12/08/23 0452 12/10/23 0452 12/10/23 1700 12/11/23 0420  WBC 12.4* 10.6* 13.2* 11.1*   Microbiology Recent Results (from the past 240 hours)  Resp panel by RT-PCR (RSV, Flu A&B, Covid) Anterior Nasal Swab     Status: None   Collection Time: 12/07/23  1:50 PM   Specimen: Anterior Nasal Swab  Result Value Ref Range Status   SARS Coronavirus 2 by RT PCR NEGATIVE  NEGATIVE Final    Comment: (NOTE) SARS-CoV-2 target nucleic acids are NOT DETECTED.  The SARS-CoV-2 RNA is generally detectable in upper respiratory specimens during the acute phase of infection. The lowest concentration of SARS-CoV-2 viral copies this assay can detect is 138 copies/mL. A negative result does not preclude SARS-Cov-2 infection and should not be used as the sole basis for treatment or other patient management decisions. A negative result may occur with  improper specimen collection/handling, submission of specimen other than nasopharyngeal swab, presence of viral mutation(s) within the areas targeted by this assay, and inadequate number of viral copies(<138 copies/mL). A negative result must be combined with clinical observations, patient history, and epidemiological information. The expected result is Negative.  Fact Sheet for Patients:  BloggerCourse.com  Fact Sheet for Healthcare Providers:  SeriousBroker.it  This test is no t yet approved or cleared by the [place]  FDA and  has been authorized for detection and/or diagnosis of SARS-CoV-2 by FDA under an Emergency Use Authorization (EUA). This EUA will remain  in effect (meaning this test can be used) for the duration of the COVID-19 declaration under Section 564(b)(1) of the Act, 21 U.S.C.section 360bbb-3(b)(1), unless the authorization is terminated  or revoked sooner.       Influenza A by PCR NEGATIVE NEGATIVE Final   Influenza B by PCR NEGATIVE NEGATIVE Final    Comment: (NOTE) The Xpert Xpress SARS-CoV-2/FLU/RSV plus assay is intended as an aid in the diagnosis  of influenza from Nasopharyngeal swab specimens and should not be used as a sole basis for treatment. Nasal washings and aspirates are unacceptable for Xpert Xpress SARS-CoV-2/FLU/RSV testing.  Fact Sheet for Patients: BloggerCourse.com  Fact Sheet for Healthcare  Providers: SeriousBroker.it  This test is not yet approved or cleared by the [place]  FDA and has been authorized for detection and/or diagnosis of SARS-CoV-2 by FDA under an Emergency Use Authorization (EUA). This EUA will remain in effect (meaning this test can be used) for the duration of the COVID-19 declaration under Section 564(b)(1) of the Act, 21 U.S.C. section 360bbb-3(b)(1), unless the authorization is terminated or revoked.     Resp Syncytial Virus by PCR NEGATIVE NEGATIVE Final    Comment: (NOTE) Fact Sheet for Patients: BloggerCourse.com  Fact Sheet for Healthcare Providers: SeriousBroker.it  This test is not yet approved or cleared by the [place]  FDA and has been authorized for detection and/or diagnosis of SARS-CoV-2 by FDA under an Emergency Use Authorization (EUA). This EUA will remain in effect (meaning this test can be used) for the duration of the COVID-19 declaration under Section 564(b)(1) of the Act, 21 U.S.C. section 360bbb-3(b)(1), unless the authorization is terminated or revoked.  Performed at [facility], [address] [place], [place] [address]   Culture, blood (routine x 2)     Status: None (Preliminary result)   Collection Time: 12/07/23  5:52 PM   Specimen: BLOOD RIGHT HAND  Result Value Ref Range Status   Specimen Description BLOOD RIGHT HAND  Final   Special Requests   Final    BOTTLES DRAWN AEROBIC AND ANAEROBIC Blood Culture adequate volume   Culture   Final    NO GROWTH 4 DAYS Performed at [facility], [address], [place], [place] [address]    Report Status PENDING  Incomplete  Culture, blood (routine x 2)     Status: None (Preliminary result)   Collection Time: 12/07/23  6:11 PM   Specimen: Left Antecubital; Blood  Result Value Ref Range Status   Specimen Description LEFT ANTECUBITAL  Final   Special Requests   Final    BOTTLES DRAWN AEROBIC  AND ANAEROBIC Blood Culture adequate volume   Culture   Final    NO GROWTH 4 DAYS Performed at [facility], [address], [place], [place] [address]    Report Status PENDING  Incomplete   Time coordinating discharge: 26 mins  SIGNED:  Faustino Hook, MD  [facility] 12/11/2023, 4:22 PM How to contact the [facility] Attending or Consulting provider 7A - 7P or covering provider during after hours 7P -7A, for this patient?  Check the care team in [facility] and look for a) attending/consulting [facility] provider listed and b) the TR[facility] team listed Log into [email or web address] and use [facility]'s universal password to access. If you do not have the password, please contact the hospital operator. Locate the [facility] provider you are looking for under [facility] and page to a number that you can be directly reached. If you still have difficulty reaching the provider, please page the [facility] (Director on Call) for the Hospitalists listed on amion for assistance.

## 2023-12-11 NOTE — Discharge Instructions (Signed)
IMPORTANT INFORMATION: PAY CLOSE ATTENTION   PHYSICIAN DISCHARGE INSTRUCTIONS  Follow with Primary care provider  [facility]  and other consultants as instructed by your Hospitalist Physician  SEEK MEDICAL CARE OR RETURN TO EMERGENCY ROOM IF SYMPTOMS COME BACK, WORSEN OR NEW PROBLEM DEVELOPS   Please note: You were cared for by a hospitalist during your hospital stay. Every effort will be made to forward records to your primary care provider.  You can request that your primary care provider send for your hospital records if they have not received them.  Once you are discharged, your primary care physician will handle any further medical issues. Please note that NO REFILLS for any discharge medications will be authorized once you are discharged, as it is imperative that you return to your primary care physician (or establish a relationship with a primary care physician if you do not have one) for your post hospital discharge needs so that they can reassess your need for medications and monitor your lab values.  Please get a complete blood count and chemistry panel checked by your Primary MD at your next visit, and again as instructed by your Primary MD.  Get Medicines reviewed and adjusted: Please take all your medications with you for your next visit with your Primary MD  Laboratory/radiological data: Please request your Primary MD to go over all hospital tests and procedure/radiological results at the follow up, please ask your primary care provider to get all Hospital records sent to his/her office.  In some cases, they will be blood work, cultures and biopsy results pending at the time of your discharge. Please request that your primary care provider follow up on these results.  If you are diabetic, please bring your blood sugar readings with you to your follow up appointment with primary care.    Please call and make your follow up appointments as soon as possible.    Also  Note the following: If you experience worsening of your admission symptoms, develop shortness of breath, life threatening emergency, suicidal or homicidal thoughts you must seek medical attention immediately by calling 911 or calling your MD immediately  if symptoms less severe.  You must read complete instructions/literature along with all the possible adverse reactions/side effects for all the Medicines you take and that have been prescribed to you. Take any new Medicines after you have completely understood and accpet all the possible adverse reactions/side effects.   Do not drive when taking Pain medications or sleeping medications (Benzodiazepines)  Do not take more than prescribed Pain, Sleep and Anxiety Medications. It is not advisable to combine anxiety,sleep and pain medications without talking with your primary care practitioner  Special Instructions: If you have smoked or chewed Tobacco  in the last 2 yrs please stop smoking, stop any regular Alcohol  and or any Recreational drug use.  Wear Seat belts while driving.  Do not drive if taking any narcotic, mind altering or controlled substances or recreational drugs or alcohol.       

## 2023-12-12 LAB — CULTURE, BLOOD (ROUTINE X 2)
Culture: NO GROWTH
Culture: NO GROWTH
Special Requests: ADEQUATE
Special Requests: ADEQUATE

## 2023-12-12 LAB — PROTEIN ELECTRO, RANDOM URINE
Albumin ELP, Urine: 28.9 %
Alpha-1-Globulin, U: 4.1 %
Alpha-2-Globulin, U: 19.5 %
Beta Globulin, U: 30.3 %
Gamma Globulin, U: 17.3 %
Total Protein, Urine: 34.4 mg/dL

## 2023-12-12 LAB — MULTIPLE MYELOMA PANEL, SERUM
Albumin SerPl Elph-Mcnc: 3.5 g/dL (ref 2.9–4.4)
Albumin/Glob SerPl: 1.1 (ref 0.7–1.7)
Alpha 1: 0.3 g/dL (ref 0.0–0.4)
Alpha2 Glob SerPl Elph-Mcnc: 1.3 g/dL — ABNORMAL HIGH (ref 0.4–1.0)
B-Globulin SerPl Elph-Mcnc: 0.9 g/dL (ref 0.7–1.3)
Gamma Glob SerPl Elph-Mcnc: 1 g/dL (ref 0.4–1.8)
Globulin, Total: 3.5 g/dL (ref 2.2–3.9)
IgA: 197 mg/dL (ref 61–437)
IgG (Immunoglobin G), Serum: 1060 mg/dL (ref 603–1613)
IgM (Immunoglobulin M), Srm: 32 mg/dL (ref 15–143)
Total Protein ELP: 7 g/dL (ref 6.0–8.5)

## 2023-12-17 ENCOUNTER — Inpatient Hospital Stay

## 2023-12-17 ENCOUNTER — Inpatient Hospital Stay: Admitting: Oncology

## 2023-12-18 ENCOUNTER — Inpatient Hospital Stay

## 2023-12-18 ENCOUNTER — Encounter: Payer: Self-pay | Admitting: Hematology

## 2023-12-18 ENCOUNTER — Inpatient Hospital Stay: Attending: Hematology | Admitting: Hematology

## 2023-12-18 VITALS — BP 171/64 | HR 85 | Temp 97.3°F | Resp 16 | Wt 143.5 lb

## 2023-12-18 DIAGNOSIS — C801 Malignant (primary) neoplasm, unspecified: Secondary | ICD-10-CM | POA: Diagnosis not present

## 2023-12-18 DIAGNOSIS — C7951 Secondary malignant neoplasm of bone: Secondary | ICD-10-CM

## 2023-12-18 DIAGNOSIS — E1140 Type 2 diabetes mellitus with diabetic neuropathy, unspecified: Secondary | ICD-10-CM | POA: Insufficient documentation

## 2023-12-18 DIAGNOSIS — Z87891 Personal history of nicotine dependence: Secondary | ICD-10-CM | POA: Diagnosis not present

## 2023-12-18 DIAGNOSIS — Z800 Family history of malignant neoplasm of digestive organs: Secondary | ICD-10-CM | POA: Insufficient documentation

## 2023-12-18 DIAGNOSIS — E611 Iron deficiency: Secondary | ICD-10-CM | POA: Diagnosis not present

## 2023-12-18 DIAGNOSIS — D631 Anemia in chronic kidney disease: Secondary | ICD-10-CM | POA: Insufficient documentation

## 2023-12-18 DIAGNOSIS — N189 Chronic kidney disease, unspecified: Secondary | ICD-10-CM | POA: Insufficient documentation

## 2023-12-18 LAB — LACTATE DEHYDROGENASE: LDH: 162 U/L (ref 98–192)

## 2023-12-18 LAB — URIC ACID: Uric Acid, Serum: 7.5 mg/dL (ref 3.7–8.6)

## 2023-12-18 NOTE — Progress Notes (Signed)
 [facility] [address] [place] [address]   Clinic Day:  12/18/2023  Referring physician: Clinic, [name]  Patient Care Team: Clinic, [name] as PCP - General [name] [name], MD as PCP - Cardiology (Cardiology) [place]az[place], Peter M, MD as Consulting Physician (Cardiology) Avanell Leigh, MD as Consulting Physician (Cardiology) Center, [facility]   ASSESSMENT & PLAN:   Assessment:  1.  Metastatic (lytic) bone lesions: - Presentation: Right anterior chest wall pain since March 2025 - CT chest (12/07/2023): New fracture through the superior endplate of T9 vertebral body at the site of mixed sclerotic and lucent lesion consistent with pathologic fracture.  Patchy bilateral upper lobe nodular airspace disease infectious/inflammatory etiology. - MRI thoracic spine (12/07/2023): Pathologic compression fracture at T9-10 with abnormal enhancement extending into the expanded right pedicle.  Multiple enhancing lesions throughout the thoracic spine.  Multiple rib lesions bilaterally without pathologic rib fracture. - MRI lumbar spine (12/07/2023): Multiple enhancing lesions throughout the lumbar spine, sacrum and iliac bones.  No pathological fracture. - CT angiogram chest (12/09/2020): No pulmonary embolism.  Multifocal pulmonary infiltrates, slightly improved in the left upper lobe.  Multiple lytic lesions within the T7 and T9 vertebral bodies with pathologic fracture of the T9 vertebral body and right 4th and 7th ribs and pathologic fracture of the right seventh rib. - No B symptoms.  Had prior nonmelanoma skin cancer removed on the face. - 12/07/2023: PSA-3.89, SPEP: M spike not observed, immunofixation unremarkable   2. Social/Family History: -Lives at home with wife and is on disability.  Gait is unsteady due to neuropathy in the feet from diabetes.  He is able to do ADLs and IADLs.  Quit tobacco use 20 years ago of 2 ppd for 40 years. Agent orange  exposure in [place]. No asbestos exposure. -Brother had esophageal cancer. No other family history of cancer.   Plan:  1.  Metastatic (lytic) bone lesions: - I have reviewed labs from recent hospitalization which showed PSA at 3.89.  SPEP and immunofixation were normal. - I have reviewed images of the scans with the patient and his wife and daughter. - Recommend testing for serum free light chains.  Will also check LDH and few other tumor markers. - Recommend PET CT scan to evaluate for primary. - Will request IR to see if they can biopsy T9 or any other bone lesions safely. - RTC after scan.   Orders Placed This Encounter  Procedures   NM PET Image Initial (PI) Skull Base To Thigh    Standing Status:   Future    Expected Date:   12/25/2023    Expiration Date:   12/17/2024    If indicated for the ordered procedure, I authorize the administration of a radiopharmaceutical per Radiology protocol:   Yes    Preferred imaging location?:   [facility]    Release to patient:   Immediate   CT Biopsy    Standing Status:   Future    Expected Date:   12/25/2023    Expiration Date:   12/17/2024    Lab orders requested (DO NOT place separate lab orders, these will be automatically ordered during procedure specimen collection)::   Surgical Pathology    Reason for Exam (SYMPTOM  OR DIAGNOSIS REQUIRED):   biopsy T9 lesion    Preferred location?:   [facility]   Lactate dehydrogenase    Standing Status:   Future    Number of Occurrences:  1    Expected Date:   12/18/2023    Expiration Date:   12/17/2024   Kappa/lambda light chains    Standing Status:   Future    Number of Occurrences:   1    Expected Date:   12/18/2023    Expiration Date:   12/17/2024   Uric acid    Standing Status:   Future    Number of Occurrences:   1    Expected Date:   12/18/2023    Expiration Date:   12/17/2024    Release to patient:   Immediate   CEA    Standing Status:   Future    Number of Occurrences:   1    Expected  Date:   12/18/2023    Expiration Date:   12/17/2024   Cancer antigen 19-9    Standing Status:   Future    Number of Occurrences:   1    Expected Date:   12/18/2023    Expiration Date:   12/17/2024   AFP tumor marker    Standing Status:   Future    Number of Occurrences:   1    Expected Date:   12/18/2023    Expiration Date:   12/17/2024   Ambulatory referral to Social Work    Referral Priority:   Routine    Referral Type:   Consultation    Referral Reason:   Specialty Services Required    Referred to Provider:   Paulett Boros, MD    Number of Visits Requested:   1   Ambulatory Referral to [facility] Nutrition    Referral Priority:   Routine    Referral Type:   Consultation    Referral Reason:   Specialty Services Required    Number of Visits Requested:   1      I,Helena R Teague,acting as a scribe for Paulett Boros, MD.,have documented all relevant documentation on the behalf of Paulett Boros, MD,as directed by  Paulett Boros, MD while in the presence of Paulett Boros, MD.   I, Paulett Boros MD, have reviewed the above documentation for accuracy and completeness, and I agree with the above.   Paulett Boros, MD   5/6/202512:56 PM  CHIEF COMPLAINT/PURPOSE OF CONSULT:   Diagnosis: Lytic bone lesions   Cancer Staging  No matching staging information was found for the patient.    Prior Therapy: None  Current Therapy: Under workup   HISTORY OF PRESENT ILLNESS:   Oncology History   No history exists.      [name] is a 78 y.o. male presenting to clinic today for evaluation of lytic bone lesions at the request of Justina Oman, MD.  Patient has a medical history of TIA, DM type II, peripheral neuropathy, CAD s/p CABG in 2017 and percutaneous coronary angioplasty, peripheral vascular disease, CKD stage 3, hyperlipidemia, GERD, and hypertension.  He was admitted to the hospital from 12/07/23 to 12/10/23 for generalized weakness, fatigue, and SOB.  Imaging was concerning for pneumonia and IV antibiotics were given x 3 days. MRI of the thoracic spine incidentally found: Pathologic compression fracture at T9-10 with abnormal enhancement extending into an expanded right pedicle. Multiple enhancing lesions throughout the thoracic spine as described compatible with metastatic disease or multiple myeloma. Multiple rib lesions bilaterally without a pathologic rib fracture. Central T2 hyperintensity in the cord at the T2-3 level is nonspecific but may represent a small focus of myelomalacia. No significant central canal stenosis. Facet hypertrophy contributes to right greater  than left foraminal stenosis from T8-9 through T11-12. Moderate right foraminal stenosis is also present at T7-8.  MRI Lumbar spine also showed: Multiple enhancing lesions throughout the lumbar spine, visualized sacrum and iliac bones compatible with metastatic disease or multiple myeloma. No pathologic fractures. Abnormal signal in the proximal left iliac artery raises concern for occlusion or slow flow. 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 Mild left subarticular and moderate left foraminal stenosis at L2-3. Mild subarticular and moderate foraminal stenosis bilaterally at L3-4, right greater than left. Moderate to severe central canal stenosis at L4-5 with right greater than left subarticular narrowing. Moderate foraminal stenosis bilaterally at L4-5 is worse on the left. Shallow right paramedian disc protrusion at L5-S1 potentially contacts the traversing right L5 nerve roots. Moderate right foraminal stenosis at L5-S1.  Ansh was briefly admitted to the hospital from 12/10/23 to 12/11/23 after a fall due to syncope, in which he hit the back of his head. Imaging showed no acute injuries and CTA was negative for PE. He was found to be orthostatically hypotensive and given IV fluids, as well as IV antibiotics.   Multiple Myeloma Panel fron 12/07/23 showed elevated Alpha 2  globulin  at 1.3 and M-spike was not observed. Urine protein electrophoresis from 12/07/23 was normal.   Today, he states that he is doing well overall. His appetite level is at 100%. His energy level is at 50%. He is accompanied by family members.   [name] states symptoms began after his first hospitalization in February 2025 for pneumonia. His pneumonia has not resolved since. [name] reports he is susceptible to pneumonia due to a medical condition he cannot name. He has regular follow-up with his PCP in [place], Dr. Sharion Davidson.  He notes right lower chest wall pain that began in March 2025, after helping his wife put on compression socks. He believes he may have pulled a muscle doing so and pain has improved since its onset. [name] has a prescription of tramadol  at home but has not required any for pain.  His wife states [name] had back pain during his hospitalization from 12/07/23 to 12/10/23. His daughter reports he was lifting weights strenuously 1 week prior to this and may be attributable to back pain.   [name] reports a dry cough with occasional phlegm that is worsened at night. He notes he is not able to walk straight and has peripheral neuropathy in the form of numbness in the feet. He reports a normal appetite, though he states he does not drink an adequate amount of water . He denies any weakness in the extremities, fevers, night sweats, or unintentional weight loss.   [name] has a history of skin cancer, not melanoma, and deniesn any other history of cancer. He follows up with Dr. Luna Salinas for a nodule that [facility] states has not grown in years.   His daughter states he has conjunctival hemorrhage on the left eye and was told while he was hospitalized this may be due to the IV heparin  he received.   PAST MEDICAL HISTORY:   Past Medical History: Past Medical History:  Diagnosis Date   Anemia    CAD S/P percutaneous coronary angioplasty 1998   PCI TO CX   CKD (chronic kidney disease) stage 3,  GFR 30-59 ml/min (HCC)    Diabetes mellitus without complication (HCC)    Diverticulitis 08/16/2017   hospitalized with diverticulitis/sepsis   GERD (gastroesophageal reflux disease)    occassionally   Hx of CABG 10/2015   x 5  Hypercholesteremia    Hypertension    Neuropathy    Peripheral vascular disease (HCC)    s/p R-L FEM-FEM BYPASS   Pneumonia    Tachycardia    after CABG, pt on medicine for this    Surgical History: Past Surgical History:  Procedure Laterality Date   ABDOMINAL AORTOGRAM Bilateral 09/19/2016   Procedure: iliac;  Surgeon: Margherita Shell, MD;  [facility];  Service: Cardiovascular;  Laterality: Bilateral;   ABDOMINAL AORTOGRAM W/LOWER EXTREMITY N/A 09/22/2021   Procedure: ABDOMINAL AORTOGRAM W/LOWER EXTREMITY;  Surgeon: Young Hensen, MD;  [facility]: [facility];  Service: Cardiovascular;  Laterality: N/A;   ABDOMINAL AORTOGRAM W/LOWER EXTREMITY N/A 10/05/2022   Procedure: ABDOMINAL AORTOGRAM W/LOWER EXTREMITY;  Surgeon: Young Hensen, MD;  [facility]: [facility];  Service: Cardiovascular;  Laterality: N/A;   BIOPSY  05/16/2023   Procedure: BIOPSY;  Surgeon: Suzette Espy, MD;  [facility];  Service: Endoscopy;;   CARDIAC CATHETERIZATION  2003   with stent   CARDIAC CATHETERIZATION N/A 10/11/2015   Procedure: Left Heart Cath and Coronary Angiography;  Surgeon: Peter M Swaziland, MD;  Location: [facility] INVASIVE CV LAB;  Service: Cardiovascular;  Laterality: N/A;   COLON RESECTION N/A 12/13/2017   Procedure: EXPLORATORY LAPAROTOMY, SIGMOID COLECTOMY WITH COLOSTOMY;  Surgeon: Dareen Ebbing, MD;  Location: [facility] OR;  Service: General;  Laterality: N/A;   COLONOSCOPY N/A 09/22/2013   Procedure: COLONOSCOPY;  Surgeon: Suzette Espy, MD;  [facility];  Service: Endoscopy;  Laterality: N/A;  9:30 AM   COLONOSCOPY N/A 02/19/2018   Procedure: COLONOSCOPY;  Surgeon: Suzette Espy, MD;  [facility];  Service: Endoscopy;  Laterality: N/A;  2:00pm   COLONOSCOPY WITH PROPOFOL  N/A 05/16/2023   Procedure: COLONOSCOPY WITH PROPOFOL ;  Surgeon: Suzette Espy, MD;  [facility];  Service: Endoscopy;  Laterality: N/A;  730am, asa 3   COLOSTOMY N/A 12/13/2017   Procedure: COLOSTOMY;  Surgeon: Dareen Ebbing, MD;  Location[facility] OR;  Service: General;  Laterality: N/A;   COLOSTOMY REVERSAL N/A 03/26/2018   Procedure: COLOSTOMY REVERSAL;  Surgeon: Dareen Ebbing, MD;  [facility];  Service: General;  Laterality: N/A;   CORONARY ARTERY BYPASS GRAFT N/A 10/18/2015   Procedure: CORONARY ARTERY BYPASS GRAFTING (CABG) x  five, using left internal mammary artery and right leg greater saphenous vein harvested endoscopically;  Surgeon: Zelphia Higashi, MD;  Location: [facility];  Service: Open Heart Surgery;  Laterality: N/A;   ENDARTERECTOMY FEMORAL Right 08/09/2016   Procedure: ENDARTERECTOMY FEMORAL WITH VEIN PATCH ANGIOPLASTY;  Surgeon: Margherita Shell, MD;  [facility];  Service: Vascular;  Laterality: Right;   ESOPHAGOGASTRODUODENOSCOPY N/A 10/24/2017   Dr. Riley Cheadle: hiatal hernia   ESOPHAGOGASTRODUODENOSCOPY (EGD) WITH PROPOFOL  N/A 05/16/2023   Procedure: ESOPHAGOGASTRODUODENOSCOPY (EGD) WITH PROPOFOL ;  Surgeon: Suzette Espy, MD;  [facility];  Service: Endoscopy;  Laterality: N/A;   FEMORAL-FEMORAL BYPASS GRAFT Bilateral 08/09/2016   Procedure: REVISION BYPASS GRAFT RIGHT FEMORAL-LEFT FEMORAL ARTERY;  Surgeon: Margherita Shell, MD;  Location: [facility] OR;  Service: Vascular;  Laterality: Bilateral;   FEMORAL-POPLITEAL BYPASS GRAFT     INTRAMEDULLARY (IM) NAIL INTERTROCHANTERIC Left 04/03/2021   Procedure: LEFT  HIP INTRAMEDULLARY (IM) NAIL INTERTROCHANTRIC;  Surgeon: Arnie Lao, MD;  [facility];  Service: Orthopedics;  Laterality: Left;   MALONEY DILATION N/A 05/16/2023   Procedure: Londa Rival DILATION;  Surgeon: Suzette Espy, MD;  [facility];  Service: Endoscopy;  Laterality: N/A;   PERIPHERAL VASCULAR BALLOON ANGIOPLASTY Right 09/22/2021   Procedure: PERIPHERAL VASCULAR BALLOON ANGIOPLASTY;  Surgeon: Young Hensen, MD;  [facility];  Service: Cardiovascular;  Laterality: Right;   PERIPHERAL VASCULAR CATHETERIZATION N/A 05/08/2016   Procedure: Lower Extremity Angiography;  Surgeon: Avanell Leigh, MD;  Location: [facility] INVASIVE CV LAB;  Service: Cardiovascular;  Laterality: N/A;   PERIPHERAL VASCULAR INTERVENTION Right 09/19/2016   Procedure: Peripheral Vascular Intervention;  Surgeon: Margherita Shell, MD;  [facility];  Service: Cardiovascular;  Laterality: Right;  ext iliac stent   PERIPHERAL VASCULAR INTERVENTION Right 09/22/2021   Procedure: PERIPHERAL VASCULAR INTERVENTION;  Surgeon: Young Hensen, MD;  [facility];  Service: Cardiovascular;  Laterality: Right;   POLYPECTOMY  02/19/2018   Procedure: POLYPECTOMY;  Surgeon: Suzette Espy, MD;  [facility];  Service: Endoscopy;;   PROCTOSCOPY  10/24/2017   Procedure: PROCTOSCOPY;  Surgeon: Suzette Espy, MD;  [facility];  Service: Endoscopy;;   TEE WITHOUT CARDIOVERSION N/A 10/18/2015   Procedure: TRANSESOPHAGEAL ECHOCARDIOGRAM (TEE);  Surgeon: Zelphia Higashi, MD;  Location: [facility] OR;  Service: Open Heart Surgery;  Laterality: N/A;    Social History: Social History   Socioeconomic History   Marital status: Married    Spouse name: Not on file   Number of children: Not on file   Years of education: Not on file   Highest education level: Not on file  Occupational History   Not on file  Tobacco Use   Smoking status: Former    Current packs/day: 0.00    Average packs/day: 2.0 packs/day for 30.0 years (60.0 ttl pk-yrs)    Types: Cigarettes    Start date: 08/14/1970    Quit date: 08/14/2000    Years since quitting: 23.3   Smokeless tobacco: Never  Vaping Use   Vaping status:  Never Used  Substance and Sexual Activity   Alcohol  use: No   Drug use: No   Sexual activity: Not Currently  Other Topics Concern   Not on file  Social History Narrative   Not on file   Social Drivers of Health   Financial Resource Strain: Low Risk  (10/08/2023)   Received from [facility]   Overall Financial Resource Strain (CARDIA)    Difficulty of Paying Living Expenses: Not very hard  Food Insecurity: Food Insecurity Present (12/18/2023)   Hunger Vital Sign    Worried About Running Out of Food in the Last Year: Sometimes true    Ran Out of Food in the Last Year: Sometimes true  Transportation Needs: No Transportation Needs (12/18/2023)   PRAPARE - Administrator, Civil Service (Medical): No    Lack of Transportation (Non-Medical): No  Physical Activity: Not on file  Stress: Not on file  Social Connections: Socially Integrated (12/11/2023)   Social Connection and Isolation Panel [NHANES]    Frequency of Communication with Friends and Family: Never    Frequency of Social Gatherings with Friends and Family: Three times a week    Attends Religious Services: 1 to 4 times per year    Active Member of Clubs or Organizations: Yes    Attends Banker Meetings: 1 to 4 times per year    Marital Status: Married  Catering manager Violence: Not At Risk (12/18/2023)   Humiliation, Afraid, Rape, and Kick questionnaire    Fear  of Current or Ex-Partner: No    Emotionally Abused: No    Physically Abused: No    Sexually Abused: No    Family History: Family History  Problem Relation Age of Onset   Heart attack Mother    Stroke Mother    Colon cancer Brother        late 49s    Current Medications:  Current Outpatient Medications:    amLODipine  (NORVASC ) 10 MG tablet, Take 10 mg by mouth in the morning., Disp: , Rfl:    aspirin  EC 81 MG tablet, Take 1 tablet (81 mg total) by mouth daily with breakfast., Disp: 30 tablet, Rfl: 2   atorvastatin  (LIPITOR ) 40 MG  tablet, Take 1 tablet (40 mg total) by mouth at bedtime., Disp: , Rfl:    baclofen (LIORESAL) 10 MG tablet, Take 10 mg by mouth at bedtime., Disp: , Rfl:    cilostazol  (PLETAL ) 100 MG tablet, Take 100 mg by mouth 2 (two) times daily., Disp: , Rfl:    clopidogrel  (PLAVIX ) 75 MG tablet, Take 75 mg by mouth in the morning., Disp: , Rfl:    empagliflozin  (JARDIANCE ) 25 MG TABS tablet, 12.5 mg daily as directed, Disp: , Rfl:    gabapentin  (NEURONTIN ) 600 MG tablet, Take 600 mg by mouth 2 (two) times daily., Disp: , Rfl:    insulin  glargine (LANTUS ) 100 UNIT/ML injection, Inject 0.1 mLs (10 Units total) into the skin at bedtime., Disp: , Rfl:    losartan  (COZAAR ) 50 MG tablet, Take 50 mg by mouth daily., Disp: , Rfl:    metoprolol  tartrate (LOPRESSOR ) 50 MG tablet, Take 25 mg by mouth 2 (two) times daily. , Disp: , Rfl:    pantoprazole  (PROTONIX ) 40 MG tablet, Take 1 tablet (40 mg total) by mouth daily., Disp: , Rfl:    traMADol  (ULTRAM ) 50 MG tablet, Take 0.5 tablets (25 mg total) by mouth every 12 (twelve) hours as needed for severe pain (pain score 7-10)., Disp: , Rfl:    vitamin B-12 (CYANOCOBALAMIN ) 1000 MCG tablet, Take 1,000 mcg by mouth in the morning., Disp: , Rfl:    Allergies: Allergies  Allergen Reactions   Acarbose Diarrhea    REVIEW OF SYSTEMS:   Review of Systems  Constitutional:  Negative for chills, fatigue and fever.  HENT:   Negative for lump/mass, mouth sores, nosebleeds, sore throat and trouble swallowing.   Eyes:  Negative for eye problems.  Respiratory:  Positive for cough. Negative for shortness of breath.   Cardiovascular:  Positive for chest pain (right sided, 5/10 severity). Negative for leg swelling and palpitations.  Gastrointestinal:  Negative for abdominal pain, constipation, diarrhea, nausea and vomiting.  Genitourinary:  Negative for bladder incontinence, difficulty urinating, dysuria, frequency, hematuria and nocturia.   Musculoskeletal:  Negative for  arthralgias, back pain, flank pain, myalgias and neck pain.  Skin:  Negative for itching and rash.  Neurological:  Negative for dizziness, headaches and numbness.  Hematological:  Does not bruise/bleed easily.  Psychiatric/Behavioral:  Negative for depression, sleep disturbance and suicidal ideas. The patient is not nervous/anxious.   All other systems reviewed and are negative.    VITALS:   Blood pressure (!) 171/64, pulse 85, temperature (!) 97.3 F (36.3 C), temperature source Oral, resp. rate 16, weight 143 lb 8.3 oz (65.1 kg), SpO2 99%.  Wt Readings from Last 3 Encounters:  12/18/23 143 lb 8.3 oz (65.1 kg)  12/10/23 140 lb 3.4 oz (63.6 kg)  12/07/23 135 lb 12.9 oz (61.6 kg)  Body mass index is 22.48 kg/m.  Performance status (ECOG): 1 - Symptomatic but completely ambulatory  PHYSICAL EXAM:   Physical Exam Vitals and nursing note reviewed. Exam conducted with a chaperone present.  Constitutional:      Appearance: Normal appearance.  Cardiovascular:     Rate and Rhythm: Normal rate and regular rhythm.     Pulses: Normal pulses.     Heart sounds: Normal heart sounds.  Pulmonary:     Effort: Pulmonary effort is normal.     Breath sounds: Normal breath sounds.  Chest:     Chest wall: Tenderness (on right anterior) present.     Comments: + Abdominal:     Palpations: Abdomen is soft. There is no hepatomegaly, splenomegaly or mass.     Tenderness: There is no abdominal tenderness.  Musculoskeletal:     Right lower leg: No edema.     Left lower leg: No edema.  Lymphadenopathy:     Cervical: No cervical adenopathy.     Right cervical: No superficial, deep or posterior cervical adenopathy.    Left cervical: No superficial, deep or posterior cervical adenopathy.     Upper Body:     Right upper body: No supraclavicular or axillary adenopathy.     Left upper body: No supraclavicular or axillary adenopathy.  Neurological:     General: No focal deficit present.     Mental  Status: He is alert and oriented to person, place, and time.  Psychiatric:        Mood and Affect: Mood normal.        Behavior: Behavior normal.     LABS:   CBC    Component Value Date/Time   WBC 11.1 (H) 12/11/2023 0420   RBC 3.66 (L) 12/11/2023 0420   HGB 9.5 (L) 12/11/2023 0420   HGB 10.8 (L) 02/20/2023 1253   HCT 29.9 (L) 12/11/2023 0420   HCT 34.6 (L) 02/20/2023 1253   PLT 361 12/11/2023 0420   PLT 456 (H) 02/20/2023 1253   MCV 81.7 12/11/2023 0420   MCV 81 02/20/2023 1253   MCH 26.0 12/11/2023 0420   MCHC 31.8 12/11/2023 0420   RDW 15.8 (H) 12/11/2023 0420   RDW 15.4 02/20/2023 1253   LYMPHSABS 0.6 (L) 12/10/2023 1700   LYMPHSABS 1.3 02/20/2023 1253   MONOABS 1.1 (H) 12/10/2023 1700   EOSABS 0.1 12/10/2023 1700   EOSABS 0.3 02/20/2023 1253   BASOSABS 0.0 12/10/2023 1700   BASOSABS 0.1 02/20/2023 1253    CMP    Component Value Date/Time   NA 137 12/11/2023 0420   NA 139 02/20/2023 1253   K 3.9 12/11/2023 0420   CL 107 12/11/2023 0420   CO2 21 (L) 12/11/2023 0420   GLUCOSE 157 (H) 12/11/2023 0420   BUN 38 (H) 12/11/2023 0420   BUN 24 02/20/2023 1253   CREATININE 2.28 (H) 12/11/2023 0420   CREATININE 1.28 (H) 04/25/2016 1038   CALCIUM  8.1 (L) 12/11/2023 0420   PROT 5.9 (L) 12/11/2023 0420   PROT 6.8 02/20/2023 1253   ALBUMIN  2.8 (L) 12/11/2023 0420   ALBUMIN  4.3 02/20/2023 1253   AST 11 (L) 12/11/2023 0420   ALT 7 12/11/2023 0420   ALKPHOS 74 12/11/2023 0420   BILITOT 0.4 12/11/2023 0420   BILITOT 0.4 02/20/2023 1253   GFRNONAA 29 (L) 12/11/2023 0420   GFRAA >60 05/22/2019 0611     Lab Results  Component Value Date   CEA1 2.0 12/12/2017   /  CEA  Date  Value Ref Range Status  12/12/2017 2.0 0.0 - 4.7 ng/mL Final    Comment:    (NOTE)                             Nonsmokers          <3.9                             Smokers             <5.6 [employer] Electrochemiluminescence Immunoassay (ECLIA) Values obtained with different assay  methods or kits cannot be used interchangeably.  Results cannot be interpreted as absolute evidence of the presence or absence of malignant disease. Performed At: [facility] [address] [place], [place] [id number] Pearlean Botts MD AO:[id number] Performed at [facility], [address], [place], [place] [address]    No results found for: "PSA1" No results found for: "CAN199" No results found for: "MWU132"  Lab Results  Component Value Date   TOTALPROTELP 7.0 12/07/2023   No results found for: "TIBC", "FERRITIN", "IRONPCTSAT" Lab Results  Component Value Date   LDH 162 12/18/2023     STUDIES:   CT Angio Chest PE W and/or Wo Contrast Result Date: 12/10/2023 CLINICAL DATA:  High probability pulmonary embolism, pneumonia, syncope EXAM: CT ANGIOGRAPHY CHEST WITH CONTRAST TECHNIQUE: Multidetector CT imaging of the chest was performed using the standard protocol during bolus administration of intravenous contrast. Multiplanar CT image reconstructions and MIPs were obtained to evaluate the vascular anatomy. RADIATION DOSE REDUCTION: This exam was performed according to the departmental dose-optimization program which includes automated exposure control, adjustment of the mA and/or kV according to patient size and/or use of iterative reconstruction technique. CONTRAST:  60mL OMNIPAQUE  IOHEXOL  350 MG/ML SOLN COMPARISON:  12/07/2023 FINDINGS: Cardiovascular: Status post coronary artery bypass grafting. Global cardiac size within limits. No pericardial effusion. Central pulmonary arteries are of normal caliber. No intraluminal filling defect identified through the segmental level to suggest acute pulmonary embolism. Extensive atherosclerotic calcification within the thoracic aorta. No aortic aneurysm. Mediastinum/Nodes: No enlarged mediastinal, hilar, or axillary lymph nodes. Thyroid gland, trachea, and esophagus demonstrate no significant findings. Lungs/Pleura: Multifocal  pulmonary infiltrates are again identified, slightly improved within the left upper lobe, but progressive within the right upper lobe in keeping with changes of multifocal pneumonia. Left lower lobe platelike atelectasis. No pneumothorax or pleural effusion. No central obstructing lesion. Upper Abdomen: Cholelithiasis without pericholecystic inflammatory change. No acute abnormality within visualized upper abdomen. 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 Musculoskeletal: Multiple lytic lesions are again identified within the T7 and T9 vertebral bodies, with pathologic fracture of the T9 vertebral body, and right 4 and 7 ribs, pathologic fracture right seventh rib. The findings are in keeping with with metastatic disease, less likely multiple myeloma. These are better assessed on MRI examination 12/07/2023. Review of the MIP images confirms the above findings. IMPRESSION: 1. No acute pulmonary embolism. 2. Multifocal pulmonary infiltrates, slightly improved within the left upper lobe, but progressive within the right upper lobe in keeping with changes of multifocal pneumonia. 3. Multiple lytic lesions within the T7 and T9 vertebral bodies, with pathologic fracture of the T9 vertebral body, and right 4 and 7 ribs, and pathologic fracture of the right seventh rib. The findings are in keeping with metastatic disease, less likely  multiple myeloma. These are better assessed on MRI examination 12/07/2023. 4. 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 Clinical correlation for signs and symptoms chronic mesenteric ischemia recommended. If indicated, this could be better assessed with dedicated CT arteriography. 5. Cholelithiasis. Electronically Signed   By: Worthy Heads M.D.   On: 12/10/2023 20:19   CT Head Wo  Contrast Result Date: 12/10/2023 CLINICAL DATA:  Head trauma, minor (Age >= 65y).  Fall from chair. EXAM: CT HEAD WITHOUT CONTRAST TECHNIQUE: Contiguous axial images were obtained from the base of the skull through the vertex without intravenous contrast. RADIATION DOSE REDUCTION: This exam was performed according to the departmental dose-optimization program which includes automated exposure control, adjustment of the mA and/or kV according to patient size and/or use of iterative reconstruction technique. COMPARISON:  Head CT 09/13/2018 and MRI 03/29/2018 FINDINGS: Brain: There is no evidence of an acute infarct, intracranial hemorrhage, mass, midline shift, or extra-axial fluid collection. There is mild cerebral atrophy. A normal variant cavum septum pellucidum et vergae is noted. A lacunar infarct in the right corona radiata is new but chronic in appearance. Mild hypodensities elsewhere in the cerebral white matter bilaterally are nonspecific but compatible with chronic small vessel ischemia. Vascular: Calcified atherosclerosis at the skull base. No hyperdense vessel. Skull: No acute fracture or suspicious lesion. Sinuses/Orbits: Visualized paranasal sinuses are clear. Moderately large right mastoid effusion. Unremarkable orbits. Other: None. IMPRESSION: 1. No evidence of acute intracranial abnormality. 2. Mild chronic small vessel ischemia. Electronically Signed   By: Aundra Lee M.D.   On: 12/10/2023 20:10   DG Chest Portable 1 View Result Date: 12/10/2023 CLINICAL DATA:  Recent pneumonia.  Syncope. EXAM: PORTABLE CHEST 1 VIEW COMPARISON:  Chest radiograph dated 12/07/2023. FINDINGS: Shallow inspiration. Faint bilateral mid to lower lung field interstitial densities may represent atelectasis or infiltrate. No consolidative changes. No large pleural effusion or pneumothorax. Stable cardiac silhouette. Median sternotomy wires and CABG vascular clips. No acute osseous pathology. IMPRESSION: Shallow  inspiration with bilateral mid to lower lung field atelectasis or infiltrate. Electronically Signed   By: Angus Bark M.D.   On: 12/10/2023 17:53   MR THORACIC SPINE W WO CONTRAST Result Date: 12/07/2023 CLINICAL DATA:  Thoracic spine compression fracture. Back pain. Decreased appetite. EXAM: MRI THORACIC WITHOUT AND WITH CONTRAST TECHNIQUE: Multiplanar and multiecho pulse sequences of the thoracic spine were obtained without and with intravenous contrast. CONTRAST:  6mL GADAVIST  GADOBUTROL  1 MMOL/ML IV SOLN COMPARISON:  CT chest without contrast 12/07/2023. MRI of the thoracic spine without and with contrast 02/22/2021 FINDINGS: Alignment: No significant listhesis is present. Leftward curvature of the thoracic spine is centered at T8-9. Vertebrae: Pathologic compression fracture is confirmed at T9-10. Abnormal enhancement extending extends into an expanded right pedicle. Prominent lesion across the inferior right lateral aspect of the T7 vertebral body measures 2.2 x 1.4 cm. Extensive enhancement encompasses the majority of the T9 vertebral body with some sparing on the left. 11 mm rounded enhancing lesion is present in the left T4. Multiple lesions are present at T2, the largest anteriorly on the left along the inferior endplate measuring 11 mm. No other pathologic fractures are present. No significant extraosseous tumor is present. Multiple rib lesions are present bilaterally without a pathologic rib fracture. Cord: Central T2 hyperintensities noted in the cord at the T2-3 level. No other cord signal changes present. Cord morphology is within normal limits. Paraspinal and other soft tissues: The paraspinous  soft tissues are within normal limits. The visualized lung fields are clear. No primary lesion is identified. Disc levels: No significant central disc protrusion or central canal stenosis is present. Facet hypertrophy contributes 2 right greater than left foraminal stenosis from T8-9 through T11-12.  Moderate right foraminal stenosis is also present at T7-8. IMPRESSION: 1. Pathologic compression fracture at T9-10 with abnormal enhancement extending into an expanded right pedicle. 2. Multiple enhancing lesions throughout the thoracic spine as described compatible with metastatic disease or multiple myeloma. 3. Multiple rib lesions bilaterally without a pathologic rib fracture. 4. Central T2 hyperintensity in the cord at the T2-3 level is nonspecific but may represent a small focus of myelomalacia. 5. No significant central canal stenosis. 6. Facet hypertrophy contributes to right greater than left foraminal stenosis from T8-9 through T11-12. Moderate right foraminal stenosis is also present at T7-8. These results were called by telephone at the time of interpretation on 12/07/2023 at 6:31 pm to provider JULIE HAVILAND , who verbally acknowledged these results. Electronically Signed   By: Audree Leas M.D.   On: 12/07/2023 18:31   MR Lumbar Spine W Wo Contrast Result Date: 12/07/2023 CLINICAL DATA:  Back pain and chills. EXAM: MRI LUMBAR SPINE WITHOUT AND WITH CONTRAST TECHNIQUE: Multiplanar and multiecho pulse sequences of the lumbar spine were obtained without and with intravenous contrast. CONTRAST:  6mL GADAVIST  GADOBUTROL  1 MMOL/ML IV SOLN COMPARISON:  CT of the abdomen and pelvis without contrast 04/13/2023 FINDINGS: Segmentation: 5 non rib-bearing lumbar type vertebral bodies are present. The lowest fully formed vertebral body is L5. Alignment: No significant listhesis is present. Rightward curvature of the lumbar spine is centered at L3. Vertebrae: Multiple enhancing lesions are present throughout the lumbar spine. Lesion anteriorly on the left subjacent to the superior endplate of L3 measures 3.1 x 2.6 x 1.7 cm. And a lesion inferiorly in anteriorly on the right measures 1.8 x 1.5 x 1.8 cm. No pathologic fractures are present. Multiple other smaller lesions are present throughout the lumbar  spine, visualized sacrum and iliac bones. Conus medullaris and cauda equina: Conus extends to the L1 level. Conus and cauda equina appear normal. Paraspinal and other soft tissues: Simple cysts are present in the pole of both kidneys. The largest is on the right measuring 3 cm. Atherosclerotic changes are present in the aorta and branch vessels. Abnormal signal in the proximal left iliac artery raises concern for occlusion or slow flow. No significant adenopathy is present. No primary lesion is identified. Disc levels: L1-2: Mild disc bulging and facet hypertrophy is present. No significant stenosis is present. L2-3: A broad-based disc protrusion is asymmetric to the left. Mild facet hypertrophy is noted bilaterally. Mild left subarticular and moderate left foraminal stenosis is present. L3-4: A broad-based disc protrusion is present. Moderate facet hypertrophy is worse on the right. This results in mild subarticular and moderate foraminal stenosis bilaterally, right greater than left. L4-5: A broad-based disc protrusion is present. Moderate facet hypertrophy is worse on the right. Moderate to severe central canal stenosis is present with right greater than left subarticular narrowing. Moderate foraminal stenosis is worse on the left. L5-S1: A shallow right paramedian disc protrusion is present. This potentially contacts the traversing right L5 nerve roots. Facet spurring contributes to moderate right foraminal stenosis. IMPRESSION: 1. Multiple enhancing lesions throughout the lumbar spine, visualized sacrum and iliac bones compatible with metastatic disease or multiple myeloma. 2. No pathologic fractures. 3. Abnormal signal in the proximal left iliac artery raises concern for  occlusion or slow flow. 4. 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 5. Mild left subarticular and moderate left foraminal stenosis at L2-3. 6. Mild subarticular and moderate foraminal stenosis bilaterally at L3-4, right greater  than left. 7. Moderate to severe central canal stenosis at L4-5 with right greater than left subarticular narrowing. 8. Moderate foraminal stenosis bilaterally at L4-5 is worse on the left. 9. Shallow right paramedian disc protrusion at L5-S1 potentially contacts the traversing right L5 nerve roots. 10. Moderate right foraminal stenosis at L5-S1. These results were called by telephone at the time of interpretation on 12/07/2023 at 5:57 Pm to provider [facility], who verbally acknowledged these results. Electronically Signed   By: Audree Leas M.D.   On: 12/07/2023 18:17   CT Chest Wo Contrast Result Date: 12/07/2023 CLINICAL DATA:  Back pain, weakness, decreased appetite EXAM: CT CHEST WITHOUT CONTRAST TECHNIQUE: Multidetector CT imaging of the chest was performed following the standard protocol without IV contrast. RADIATION DOSE REDUCTION: This exam was performed according to the departmental dose-optimization program which includes automated exposure control, adjustment of the mA and/or kV according to patient size and/or use of iterative reconstruction technique. COMPARISON:  12/07/2023, 08/28/2023 FINDINGS: Cardiovascular: Unenhanced imaging of the heart is unremarkable without pericardial effusion. 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 Dense atherosclerosis of the native coronary vasculature. Normal caliber of the thoracic aorta. Extensive atherosclerosis of the aorta and its branches. Assessment of the vascular lumen cannot be performed without IV contrast. Mediastinum/Nodes: No enlarged mediastinal or axillary lymph nodes. Thyroid gland, trachea, and esophagus demonstrate no significant findings. Lungs/Pleura: Chronic elevation of the left hemidiaphragm, with mild left lower lobe bronchiectasis and scarring again noted. There are also linear opacities within the left upper lobe consistent with scarring seen previously. However, there is some new patchy nodular airspace  disease within the bilateral upper lobes, left greater than right, concerning for superimposed pneumonia. No effusion or pneumothorax. Central airways are patent. Upper Abdomen: Cholelithiasis without evidence of acute cholecystitis. No other acute upper abdominal findings. Musculoskeletal: The mixed sclerotic and lucent lesion within the T9 vertebral body with associated right paraspinal soft tissue component is again identified, measuring 2.4 x 2.5 cm reference image 98/2. Since the previous exam, it appears that there is slight invagination of the superior endplate of the T9 vertebral body on the right, consistent with superimposed pathologic fracture. Less than 10% loss of height. No retropulsion. Further evaluation with MRI may be useful given symptoms of back pain. There is a healing subacute to chronic right posterolateral seventh rib fracture with abundant callus formation, new since prior CT. No other acute bony abnormalities. Reconstructed images demonstrate no additional findings. IMPRESSION: 1. New fracture through the superior endplate of the T9 vertebral body at the site of the mixed sclerotic and lucent lesion seen on prior CT and MRI exams, consistent with pathologic fracture. Further evaluation with MRI may be useful. 2. Patchy bilateral upper lobe nodular airspace disease consistent with inflammatory or infectious etiology. 3. Chronic areas of scarring or atelectasis within the left upper and left lower lobes, with stable elevation of the left hemidiaphragm. 4.  Aortic Atherosclerosis (ICD10-I70.0). Electronically Signed   By: Bobbye Burrow M.D.   On: 12/07/2023 16:39   DG Chest Portable 1 View Result Date: 12/07/2023 CLINICAL DATA:  Cough, back pain EXAM: PORTABLE CHEST - 1 VIEW COMPARISON:  08/09/2023 FINDINGS: Low lung volumes. Chronic linear scarring or atelectasis laterally in the left mid lung. No new infiltrate or overt  edema. Heart size and mediastinal contours are within normal limits.  Aortic Atherosclerosis (ICD10-170.0). CABG markers. No effusion. Sternotomy wires. IMPRESSION: Low volumes with chronic left mid lung scarring or atelectasis. Electronically Signed   By: Nicoletta Barrier M.D.   On: 12/07/2023 13:29

## 2023-12-18 NOTE — Patient Instructions (Addendum)
 You were seen and examined today by Dr. Cheree Cords. Dr. Katragadda is a medical oncologist, meaning that he specializes in the treatment of cancer diagnoses. Dr. Cheree Cords discussed your past medical history, family history of cancers, and the events that led to you being here today.  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 He recommends a PET scan to evaluate this further. PET scans are a special kind of CT scan that light up where there are areas of cancer in the body. We will also arrange for you to have a biopsy of one of the lesions on your spine. This will be done by interventional radiology in [place]. The biopsy will help us  determine the type of cancer this is.   We will check lab work today, specifically checking tumor markers that are elevated in certain types of cancer.   We will see you back after the scan to review those results.   Return as scheduled.

## 2023-12-19 LAB — KAPPA/LAMBDA LIGHT CHAINS
Kappa free light chain: 68.8 mg/L — ABNORMAL HIGH (ref 3.3–19.4)
Kappa, lambda light chain ratio: 1.06 (ref 0.26–1.65)
Lambda free light chains: 64.8 mg/L — ABNORMAL HIGH (ref 5.7–26.3)

## 2023-12-19 LAB — CANCER ANTIGEN 19-9: CA 19-9: 17 U/mL (ref 0–35)

## 2023-12-19 LAB — CEA: CEA: 3.6 ng/mL (ref 0.0–4.7)

## 2023-12-19 LAB — AFP TUMOR MARKER: AFP, Serum, Tumor Marker: <1.8 ng/mL (ref 0.0–8.4)

## 2023-12-19 NOTE — Progress Notes (Signed)
 Anthony Asper, [name]  [name]; P Ir Procedure Requests OK for fluoro/IR guided biopsy of T9 lesion, right side of vertebral body, transpedicular.  Staff will need to be the Neuro-IR team, or a VIR staff that performs kyphoplasty.  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

## 2023-12-25 ENCOUNTER — Other Ambulatory Visit (HOSPITAL_COMMUNITY): Payer: Self-pay | Admitting: Student

## 2023-12-25 DIAGNOSIS — M898X9 Other specified disorders of bone, unspecified site: Secondary | ICD-10-CM

## 2023-12-26 ENCOUNTER — Ambulatory Visit (HOSPITAL_COMMUNITY)
Admission: RE | Admit: 2023-12-26 | Discharge: 2023-12-26 | Disposition: A | Discharge status: Home / Self Care | Source: Ambulatory Visit | Attending: Hematology | Admitting: Hematology

## 2023-12-26 ENCOUNTER — Other Ambulatory Visit: Payer: Self-pay

## 2023-12-26 DIAGNOSIS — Z7902 Long term (current) use of antithrombotics/antiplatelets: Secondary | ICD-10-CM | POA: Diagnosis not present

## 2023-12-26 DIAGNOSIS — C7951 Secondary malignant neoplasm of bone: Secondary | ICD-10-CM | POA: Diagnosis present

## 2023-12-26 DIAGNOSIS — Z87891 Personal history of nicotine dependence: Secondary | ICD-10-CM | POA: Insufficient documentation

## 2023-12-26 DIAGNOSIS — M899 Disorder of bone, unspecified: Secondary | ICD-10-CM | POA: Diagnosis not present

## 2023-12-26 DIAGNOSIS — M898X9 Other specified disorders of bone, unspecified site: Secondary | ICD-10-CM | POA: Diagnosis not present

## 2023-12-26 DIAGNOSIS — Z79899 Other long term (current) drug therapy: Secondary | ICD-10-CM | POA: Insufficient documentation

## 2023-12-26 HISTORY — PX: IR FLUORO GUIDED NEEDLE PLC ASPIRATION/INJECTION LOC: IMG2395

## 2023-12-26 LAB — PROTIME-INR
INR: 1.1 (ref 0.8–1.2)
Prothrombin Time: 14 s (ref 11.4–15.2)

## 2023-12-26 LAB — CBC
HCT: 32.8 % — ABNORMAL LOW (ref 39.0–52.0)
Hemoglobin: 10.7 g/dL — ABNORMAL LOW (ref 13.0–17.0)
MCH: 25.5 pg — ABNORMAL LOW (ref 26.0–34.0)
MCHC: 32.6 g/dL (ref 30.0–36.0)
MCV: 78.3 fL — ABNORMAL LOW (ref 80.0–100.0)
Platelets: 540 10*3/uL — ABNORMAL HIGH (ref 150–400)
RBC: 4.19 MIL/uL — ABNORMAL LOW (ref 4.22–5.81)
RDW: 16.1 % — ABNORMAL HIGH (ref 11.5–15.5)
WBC: 10.9 10*3/uL — ABNORMAL HIGH (ref 4.0–10.5)
nRBC: 0 % (ref 0.0–0.2)

## 2023-12-26 LAB — GLUCOSE, CAPILLARY
Glucose-Capillary: 135 mg/dL — ABNORMAL HIGH (ref 70–99)
Glucose-Capillary: 122 mg/dL — ABNORMAL HIGH (ref 70–99)

## 2023-12-26 MED ORDER — BUPIVACAINE HCL (PF) 0.25 % IJ SOLN
INTRAMUSCULAR | Status: AC
  Filled 2023-12-26: qty 30

## 2023-12-26 MED ORDER — MIDAZOLAM HCL 2 MG/2ML IJ SOLN
INTRAMUSCULAR | Status: AC | PRN
  Administered 2023-12-26 (×2): 1 mg via INTRAVENOUS

## 2023-12-26 MED ORDER — FENTANYL CITRATE (PF) 100 MCG/2ML IJ SOLN
INTRAMUSCULAR | Status: AC | PRN
  Administered 2023-12-26 (×2): 25 ug via INTRAVENOUS

## 2023-12-26 MED ORDER — FENTANYL CITRATE (PF) 100 MCG/2ML IJ SOLN
INTRAMUSCULAR | Status: AC
  Filled 2023-12-26: qty 4

## 2023-12-26 MED ORDER — MIDAZOLAM HCL 2 MG/2ML IJ SOLN
INTRAMUSCULAR | Status: AC
  Filled 2023-12-26: qty 2

## 2023-12-26 MED ORDER — SODIUM CHLORIDE 0.9 % IV SOLN: INTRAVENOUS | Status: AC

## 2023-12-26 MED ORDER — BUPIVACAINE HCL 0.25 % IJ SOLN
30.0000 mL | Freq: Once | INTRAMUSCULAR | Status: AC
  Administered 2023-12-26: 20 mL
  Filled 2023-12-26: qty 30

## 2023-12-26 NOTE — H&P (Signed)
 Chief Complaint: Patient was seen in consultation today for T9 lesion, with request for biopsy.  Referring Provider(s): Dr. Paulett Boros, MD   Supervising Physician: Myrlene Asper  Patient Status: [facility] - Out-pt  Patient is Full Code  History of Present Illness: [name] is a 78 y.o. male  with PMHx notable for HTN, HLD, CAD s/p CABG x5, anemia, CKD stage III, DMT2, diverticulitis, and GERD.  Per Dr. Milburn Aliment progress note on 12/18/23: "Metastatic (lytic) bone lesions: - Presentation: Right anterior chest wall pain since March 2025 - CT chest (12/07/2023): New fracture through the superior endplate of T9 vertebral body at the site of mixed sclerotic and lucent lesion consistent with pathologic fracture.  Patchy bilateral upper lobe nodular airspace disease infectious/inflammatory etiology. - MRI thoracic spine (12/07/2023): Pathologic compression fracture at T9-10 with abnormal enhancement extending into the expanded right pedicle.  Multiple enhancing lesions throughout the thoracic spine.  Multiple rib lesions bilaterally without pathologic rib fracture. - MRI lumbar spine (12/07/2023): Multiple enhancing lesions throughout the lumbar spine, sacrum and iliac bones.  No pathological fracture. - CT angiogram chest (12/09/2020): No pulmonary embolism.  Multifocal pulmonary infiltrates, slightly improved in the left upper lobe.  Multiple lytic lesions within the T7 and T9 vertebral bodies with pathologic fracture of the T9 vertebral body and right 4th and 7th ribs and pathologic fracture of the right seventh rib. - No B symptoms.  Had prior nonmelanoma skin cancer removed on the face. - 12/07/2023: PSA-3.89, SPEP: M spike not observed, immunofixation unremarkable.  Plan: - I have reviewed labs from recent hospitalization which showed PSA at 3.89.  SPEP and immunofixation were normal. - I have reviewed images of the scans with the patient and his wife and daughter. - Recommend  testing for serum free light chains.  Will also check LDH and few other tumor markers. - Recommend PET CT scan to evaluate for primary. - Will request IR to see if they can biopsy T9 or any other bone lesions safely. - RTC after scan."  Interventional Radiology was requested for biopsy of T9 lesion. Request was reviewed and approved by Dr. [name] for fluoro/IR guided biopsy of T9 lesion, right side of vertebral body, transpedicular. Patient is scheduled for same in IR today.   Patient is currently without any significant complaints. Patient is alert and laying in bed, calm. Patient denies any fevers, headache, chest pain, SOB, cough, abdominal pain, nausea, vomiting or bleeding.     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   x 5   Hypercholesteremia    Hypertension    Neuropathy    Peripheral vascular disease (HCC)    s/p R-L FEM-FEM BYPASS   Pneumonia    Tachycardia    after CABG, pt on medicine for this    Past Surgical History:  Procedure Laterality Date   ABDOMINAL AORTOGRAM Bilateral 09/19/2016   Procedure: iliac;  Surgeon: Margherita Shell, MD;  Location: [facility];  Service: Cardiovascular;  Laterality: Bilateral;   ABDOMINAL AORTOGRAM W/LOWER EXTREMITY N/A 09/22/2021   Procedure: ABDOMINAL AORTOGRAM W/LOWER EXTREMITY;  Surgeon: Young Hensen, MD;  Location: [facility];  Service: Cardiovascular;  Laterality: N/A;   ABDOMINAL AORTOGRAM W/LOWER EXTREMITY N/A 10/05/2022   Procedure: ABDOMINAL AORTOGRAM W/LOWER EXTREMITY;  Surgeon: Young Hensen, MD;  Location: [facility];  Service: Cardiovascular;  Laterality:  N/A;   BIOPSY  05/16/2023   Procedure: BIOPSY;  Surgeon: Suzette Espy, MD;  Location: [facility];  Service: Endoscopy;;   CARDIAC CATHETERIZATION  2003   with stent   CARDIAC CATHETERIZATION N/A 10/11/2015   Procedure: Left Heart Cath and Coronary Angiography;  Surgeon: Peter M Swaziland, MD;  Location: [facility] INVASIVE CV LAB;  Service: Cardiovascular;  Laterality: N/A;   COLON RESECTION N/A 12/13/2017   Procedure: EXPLORATORY LAPAROTOMY, SIGMOID COLECTOMY WITH COLOSTOMY;  Surgeon: Dareen Ebbing, MD;  Location: [facility] OR;  Service: General;  Laterality: N/A;   COLONOSCOPY N/A 09/22/2013   Procedure: COLONOSCOPY;  Surgeon: Suzette Espy, MD;  [facility]: [facility];  Service: Endoscopy;  Laterality: N/A;  9:30 AM   COLONOSCOPY N/A 02/19/2018   Procedure: COLONOSCOPY;  Surgeon: Suzette Espy, MD;  [facility]: [facility];  Service: Endoscopy;  Laterality: N/A;  2:00pm   COLONOSCOPY WITH PROPOFOL  N/A 05/16/2023   Procedure: COLONOSCOPY WITH PROPOFOL ;  Surgeon: Suzette Espy, MD;  [facility];  Service: Endoscopy;  Laterality: N/A;  730am, asa 3   COLOSTOMY N/A 12/13/2017   Procedure: COLOSTOMY;  Surgeon: Dareen Ebbing, MD;  Location: [facility] OR;  Service: General;  Laterality: N/A;   COLOSTOMY REVERSAL N/A 03/26/2018   Procedure: COLOSTOMY REVERSAL;  Surgeon: Dareen Ebbing, MD;  Location: [facility];  Service: General;  Laterality: N/A;   CORONARY ARTERY BYPASS GRAFT N/A 10/18/2015   Procedure: CORONARY ARTERY BYPASS GRAFTING (CABG) x  five, using left internal mammary artery and right leg greater saphenous vein harvested endoscopically;  Surgeon: Zelphia Higashi, MD;  Location: [facility] OR;  Service: Open Heart Surgery;  Laterality: N/A;   ENDARTERECTOMY FEMORAL Right 08/09/2016   Procedure: ENDARTERECTOMY FEMORAL WITH VEIN PATCH ANGIOPLASTY;  Surgeon: Margherita Shell, MD;  Location: [facility];  Service: Vascular;  Laterality: Right;   ESOPHAGOGASTRODUODENOSCOPY N/A 10/24/2017   Dr. Riley Cheadle:  hiatal hernia   ESOPHAGOGASTRODUODENOSCOPY (EGD) WITH PROPOFOL  N/A 05/16/2023   Procedure: ESOPHAGOGASTRODUODENOSCOPY (EGD) WITH PROPOFOL ;  Surgeon: Suzette Espy, MD;  [facility];  Service: Endoscopy;  Laterality: N/A;   FEMORAL-FEMORAL BYPASS GRAFT Bilateral 08/09/2016   Procedure: REVISION BYPASS GRAFT RIGHT FEMORAL-LEFT FEMORAL ARTERY;  Surgeon: Margherita Shell, MD;  Location: [facility] OR;  Service: Vascular;  Laterality: Bilateral;   FEMORAL-POPLITEAL BYPASS GRAFT     INTRAMEDULLARY (IM) NAIL INTERTROCHANTERIC Left 04/03/2021   Procedure: LEFT  HIP INTRAMEDULLARY (IM) NAIL INTERTROCHANTRIC;  Surgeon: Arnie Lao, MD;  Location: [facility];  Service: Orthopedics;  Laterality: Left;   MALONEY DILATION N/A 05/16/2023   Procedure: Londa Rival DILATION;  Surgeon: Suzette Espy, MD;  [facility];  Service: Endoscopy;  Laterality: N/A;   PERIPHERAL VASCULAR BALLOON ANGIOPLASTY Right 09/22/2021   Procedure: PERIPHERAL VASCULAR BALLOON ANGIOPLASTY;  Surgeon: Young Hensen, MD;  [facility];  Service: Cardiovascular;  Laterality: Right;   PERIPHERAL VASCULAR CATHETERIZATION N/A 05/08/2016   Procedure: Lower Extremity Angiography;  Surgeon: Avanell Leigh, MD;  Location: [facility] INVASIVE CV LAB;  Service: Cardiovascular;  Laterality: N/A;   PERIPHERAL VASCULAR INTERVENTION Right 09/19/2016   Procedure: Peripheral Vascular Intervention;  Surgeon: Margherita Shell, MD;  [facility]: [facility];  Service:  Cardiovascular;  Laterality: Right;  ext iliac stent   PERIPHERAL VASCULAR INTERVENTION Right 09/22/2021   Procedure: PERIPHERAL VASCULAR INTERVENTION;  Surgeon: Young Hensen, MD;  [facility]: [facility];  Service: Cardiovascular;  Laterality: Right;   POLYPECTOMY  02/19/2018   Procedure: POLYPECTOMY;  Surgeon: Suzette Espy, MD;  [facility];  Service: Endoscopy;;   PROCTOSCOPY  10/24/2017   Procedure: PROCTOSCOPY;  Surgeon:  Suzette Espy, MD;  [facility];  Service: Endoscopy;;   TEE WITHOUT CARDIOVERSION N/A 10/18/2015   Procedure: TRANSESOPHAGEAL ECHOCARDIOGRAM (TEE);  Surgeon: Zelphia Higashi, MD;  Location[facility] OR;  Service: Open Heart Surgery;  Laterality: N/A;    Allergies: Acarbose  Medications: Prior to Admission medications   Medication Sig Start Date End Date Taking? Authorizing Provider  amLODipine  (NORVASC ) 10 MG tablet Take 10 mg by mouth in the morning.    [provider]  aspirin  EC 81 MG tablet Take 1 tablet (81 mg total) by mouth daily with breakfast. 05/23/19   Emokpae, Courage, MD  atorvastatin  (LIPITOR ) 40 MG tablet Take 1 tablet (40 mg total) by mouth at bedtime. 12/11/23   Johnson, Clanford L, MD  baclofen (LIORESAL) 10 MG tablet Take 10 mg by mouth at bedtime. 12/05/23   [provider]  cilostazol  (PLETAL ) 100 MG tablet Take 100 mg by mouth 2 (two) times daily.    [provider]  clopidogrel  (PLAVIX ) 75 MG tablet Take 75 mg by mouth in the morning.    [provider]  empagliflozin  (JARDIANCE ) 25 MG TABS tablet 12.5 mg daily as directed 12/11/23   Rayfield Cairo, MD  gabapentin  (NEURONTIN ) 600 MG tablet Take 600 mg by mouth 2 (two) times daily.    [provider]  insulin  glargine (LANTUS ) 100 UNIT/ML injection Inject 0.1 mLs (10 Units total) into the skin at bedtime. 12/11/23   Johnson, Clanford L, MD  losartan  (COZAAR ) 50 MG tablet Take 50 mg by mouth daily. 03/02/22   [provider]  metoprolol  tartrate (LOPRESSOR ) 50 MG tablet Take 25 mg by mouth 2 (two) times daily.     [provider]  pantoprazole  (PROTONIX ) 40 MG tablet Take 1 tablet (40 mg total) by mouth daily. 12/11/23   Rayfield Cairo, MD  traMADol  (ULTRAM ) 50 MG tablet Take 0.5 tablets (25 mg total) by mouth every 12 (twelve) hours as needed for severe pain (pain score 7-10). 12/11/23 12/10/24  Johnson, Clanford L, MD  vitamin B-12  (CYANOCOBALAMIN ) 1000 MCG tablet Take 1,000 mcg by mouth in the morning.    [provider]     Family History  Problem Relation Age of Onset   Heart attack Mother    Stroke Mother    Colon cancer Brother        late 81s    Social History   Socioeconomic History   Marital status: Married    Spouse name: Not on file   Number of children: Not on file   Years of education: Not on file   Highest education level: Not on file  Occupational History   Not on file  Tobacco Use   Smoking status: Former    Current packs/day: 0.00    Average packs/day: 2.0 packs/day for 30.0 years (60.0 ttl pk-yrs)    Types: Cigarettes    Start date: 08/14/1970    Quit date: 08/14/2000    Years since quitting: 23.3   Smokeless tobacco: Never  Vaping Use  Vaping status: Never Used  Substance and Sexual Activity   Alcohol  use: No   Drug use: No   Sexual activity: Not Currently  Other Topics Concern   Not on file  Social History Narrative   Not on file   Social Drivers of Health   Financial Resource Strain: Low Risk  (10/08/2023)   Received from [facility]   Overall Financial Resource Strain (CARDIA)    Difficulty of Paying Living Expenses: Not very hard  Food Insecurity: Food Insecurity Present (12/18/2023)   Hunger Vital Sign    Worried About Running Out of Food in the Last Year: Sometimes true    Ran Out of Food in the Last Year: Sometimes true  Transportation Needs: No Transportation Needs (12/18/2023)   PRAPARE - Administrator, Civil Service (Medical): No    Lack of Transportation (Non-Medical): No  Physical Activity: Not on file  Stress: Not on file  Social Connections: Socially Integrated (12/11/2023)   Social Connection and Isolation Panel [NHANES]    Frequency of Communication with Friends and Family: Never    Frequency of Social Gatherings with Friends and Family: Three times a week    Attends Religious Services: 1 to 4 times per year    Active Member of Clubs  or Organizations: Yes    Attends Banker Meetings: 1 to 4 times per year    Marital Status: Married     Review of Systems: A 12 point ROS discussed and pertinent positives are indicated in the HPI above.  All other systems are negative.  Vital Signs: There were no vitals taken for this visit.  Advance Care Plan: The advanced care place/surrogate decision maker was discussed at the time of visit and the patient did not wish to discuss or was not able to name a surrogate decision maker or provide an advance care plan.  Physical Exam Vitals reviewed.  Constitutional:      General: He is not in acute distress.    Appearance: Normal appearance.  HENT:     Mouth/Throat:     Mouth: Mucous membranes are dry.  Cardiovascular:     Rate and Rhythm: Normal rate and regular rhythm.     Pulses: Normal pulses.     Heart sounds: No murmur heard. Pulmonary:     Effort: Pulmonary effort is normal. No respiratory distress.     Breath sounds: Normal breath sounds.  Abdominal:     General: Abdomen is flat.     Tenderness: There is no abdominal tenderness.  Musculoskeletal:        General: Normal range of motion.     Cervical back: Normal range of motion.  Skin:    General: Skin is warm and dry.  Neurological:     Mental Status: He is alert and oriented to person, place, and time.  Psychiatric:        Mood and Affect: Mood normal.        Behavior: Behavior normal.        Thought Content: Thought content normal.        Judgment: Judgment normal.     Imaging: CT Angio Chest PE W and/or Wo Contrast Result Date: 12/10/2023 CLINICAL DATA:  High probability pulmonary embolism, pneumonia, syncope EXAM: CT ANGIOGRAPHY CHEST WITH CONTRAST TECHNIQUE: Multidetector CT imaging of the chest was performed using the standard protocol during bolus administration of intravenous contrast. Multiplanar CT image reconstructions and MIPs were obtained to evaluate the vascular anatomy. RADIATION  DOSE  REDUCTION: This exam was performed according to the departmental dose-optimization program which includes automated exposure control, adjustment of the mA and/or kV according to patient size and/or use of iterative reconstruction technique. CONTRAST:  60mL OMNIPAQUE  IOHEXOL  350 MG/ML SOLN COMPARISON:  12/07/2023 FINDINGS: Cardiovascular: Status post coronary artery bypass grafting. Global cardiac size within limits. No pericardial effusion. Central pulmonary arteries are of normal caliber. No intraluminal filling defect identified through the segmental level to suggest acute pulmonary embolism. Extensive atherosclerotic calcification within the thoracic aorta. No aortic aneurysm. Mediastinum/Nodes: No enlarged mediastinal, hilar, or axillary lymph nodes. Thyroid  gland, trachea, and esophagus demonstrate no significant findings. Lungs/Pleura: Multifocal pulmonary infiltrates are again identified, slightly improved within the left upper lobe, but progressive within the right upper lobe in keeping with changes of multifocal pneumonia. Left lower lobe platelike atelectasis. No pneumothorax or pleural effusion. No central obstructing lesion. Upper Abdomen: Cholelithiasis without pericholecystic inflammatory change. No acute abnormality within visualized upper abdomen. 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 Musculoskeletal: Multiple lytic lesions are again identified within the T7 and T9 vertebral bodies, with pathologic fracture of the T9 vertebral body, and right 4 and 7 ribs, pathologic fracture right seventh rib. The findings are in keeping with with metastatic disease, less likely multiple myeloma. These are better assessed on MRI examination 12/07/2023. Review of the MIP images confirms the above findings. IMPRESSION: 1. No acute pulmonary embolism. 2. Multifocal pulmonary infiltrates, slightly improved within the  left upper lobe, but progressive within the right upper lobe in keeping with changes of multifocal pneumonia. 3. Multiple lytic lesions within the T7 and T9 vertebral bodies, with pathologic fracture of the T9 vertebral body, and right 4 and 7 ribs, and pathologic fracture of the right seventh rib. The findings are in keeping with metastatic disease, less likely multiple myeloma. These are better assessed on MRI examination 12/07/2023. 4. 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 Clinical correlation for signs and symptoms chronic mesenteric ischemia recommended. If indicated, this could be better assessed with dedicated CT arteriography. 5. Cholelithiasis. Electronically Signed   By: Worthy Heads M.D.   On: 12/10/2023 20:19   CT Head Wo Contrast Result Date: 12/10/2023 CLINICAL DATA:  Head trauma, minor (Age >= 65y).  Fall from chair. EXAM: CT HEAD WITHOUT CONTRAST TECHNIQUE: Contiguous axial images were obtained from the base of the skull through the vertex without intravenous contrast. RADIATION DOSE REDUCTION: This exam was performed according to the departmental dose-optimization program which includes automated exposure control, adjustment of the mA and/or kV according to patient size and/or use of iterative reconstruction technique. COMPARISON:  Head CT 09/13/2018 and MRI 03/29/2018 FINDINGS: Brain: There is no evidence of an acute infarct, intracranial hemorrhage, mass, midline shift, or extra-axial fluid collection. There is mild cerebral atrophy. A normal variant cavum septum pellucidum et vergae is noted. A lacunar infarct in the right corona radiata is new but chronic in appearance. Mild hypodensities elsewhere in the cerebral white matter bilaterally are nonspecific but compatible with chronic small vessel ischemia. Vascular: Calcified atherosclerosis at the skull base. No hyperdense vessel. Skull:  No acute fracture or suspicious lesion. Sinuses/Orbits: Visualized paranasal sinuses are clear. Moderately large right mastoid effusion. Unremarkable orbits. Other: None. IMPRESSION: 1. No evidence of acute intracranial abnormality. 2. Mild chronic small vessel ischemia. Electronically Signed   By: Aundra Lee M.D.   On:  12/10/2023 20:10   DG Chest Portable 1 View Result Date: 12/10/2023 CLINICAL DATA:  Recent pneumonia.  Syncope. EXAM: PORTABLE CHEST 1 VIEW COMPARISON:  Chest radiograph dated 12/07/2023. FINDINGS: Shallow inspiration. Faint bilateral mid to lower lung field interstitial densities may represent atelectasis or infiltrate. No consolidative changes. No large pleural effusion or pneumothorax. Stable cardiac silhouette. Median sternotomy wires and CABG vascular clips. No acute osseous pathology. IMPRESSION: Shallow inspiration with bilateral mid to lower lung field atelectasis or infiltrate. Electronically Signed   By: Angus Bark M.D.   On: 12/10/2023 17:53   MR THORACIC SPINE W WO CONTRAST Result Date: 12/07/2023 CLINICAL DATA:  Thoracic spine compression fracture. Back pain. Decreased appetite. EXAM: MRI THORACIC WITHOUT AND WITH CONTRAST TECHNIQUE: Multiplanar and multiecho pulse sequences of the thoracic spine were obtained without and with intravenous contrast. CONTRAST:  6mL GADAVIST  GADOBUTROL  1 MMOL/ML IV SOLN COMPARISON:  CT chest without contrast 12/07/2023. MRI of the thoracic spine without and with contrast 02/22/2021 FINDINGS: Alignment: No significant listhesis is present. Leftward curvature of the thoracic spine is centered at T8-9. Vertebrae: Pathologic compression fracture is confirmed at T9-10. Abnormal enhancement extending extends into an expanded right pedicle. Prominent lesion across the inferior right lateral aspect of the T7 vertebral body measures 2.2 x 1.4 cm. Extensive enhancement encompasses the majority of the T9 vertebral body with some sparing on the left. 11  mm rounded enhancing lesion is present in the left T4. Multiple lesions are present at T2, the largest anteriorly on the left along the inferior endplate measuring 11 mm. No other pathologic fractures are present. No significant extraosseous tumor is present. Multiple rib lesions are present bilaterally without a pathologic rib fracture. Cord: Central T2 hyperintensities noted in the cord at the T2-3 level. No other cord signal changes present. Cord morphology is within normal limits. Paraspinal and other soft tissues: The paraspinous soft tissues are within normal limits. The visualized lung fields are clear. No primary lesion is identified. Disc levels: No significant central disc protrusion or central canal stenosis is present. Facet hypertrophy contributes 2 right greater than left foraminal stenosis from T8-9 through T11-12. Moderate right foraminal stenosis is also present at T7-8. IMPRESSION: 1. Pathologic compression fracture at T9-10 with abnormal enhancement extending into an expanded right pedicle. 2. Multiple enhancing lesions throughout the thoracic spine as described compatible with metastatic disease or multiple myeloma. 3. Multiple rib lesions bilaterally without a pathologic rib fracture. 4. Central T2 hyperintensity in the cord at the T2-3 level is nonspecific but may represent a small focus of myelomalacia. 5. No significant central canal stenosis. 6. Facet hypertrophy contributes to right greater than left foraminal stenosis from T8-9 through T11-12. Moderate right foraminal stenosis is also present at T7-8. These results were called by telephone at the time of interpretation on 12/07/2023 at 6:31 pm to provider JULIE HAVILAND , who verbally acknowledged these results. Electronically Signed   By: Audree Leas M.D.   On: 12/07/2023 18:31   MR Lumbar Spine W Wo Contrast Result Date: 12/07/2023 CLINICAL DATA:  Back pain and chills. EXAM: MRI LUMBAR SPINE WITHOUT AND WITH CONTRAST  TECHNIQUE: Multiplanar and multiecho pulse sequences of the lumbar spine were obtained without and with intravenous contrast. CONTRAST:  6mL GADAVIST  GADOBUTROL  1 MMOL/ML IV SOLN COMPARISON:  CT of the abdomen and pelvis without contrast 04/13/2023 FINDINGS: Segmentation: 5 non rib-bearing lumbar type vertebral bodies are present. The lowest fully formed vertebral body is L5. Alignment: No significant listhesis is present. Rightward curvature  of the lumbar spine is centered at L3. Vertebrae: Multiple enhancing lesions are present throughout the lumbar spine. Lesion anteriorly on the left subjacent to the superior endplate of L3 measures 3.1 x 2.6 x 1.7 cm. And a lesion inferiorly in anteriorly on the right measures 1.8 x 1.5 x 1.8 cm. No pathologic fractures are present. Multiple other smaller lesions are present throughout the lumbar spine, visualized sacrum and iliac bones. Conus medullaris and cauda equina: Conus extends to the L1 level. Conus and cauda equina appear normal. Paraspinal and other soft tissues: Simple cysts are present in the pole of both kidneys. The largest is on the right measuring 3 cm. Atherosclerotic changes are present in the aorta and branch vessels. Abnormal signal in the proximal left iliac artery raises concern for occlusion or slow flow. No significant adenopathy is present. No primary lesion is identified. Disc levels: L1-2: Mild disc bulging and facet hypertrophy is present. No significant stenosis is present. L2-3: A broad-based disc protrusion is asymmetric to the left. Mild facet hypertrophy is noted bilaterally. Mild left subarticular and moderate left foraminal stenosis is present. L3-4: A broad-based disc protrusion is present. Moderate facet hypertrophy is worse on the right. This results in mild subarticular and moderate foraminal stenosis bilaterally, right greater than left. L4-5: A broad-based disc protrusion is present. Moderate facet hypertrophy is worse on the right.  Moderate to severe central canal stenosis is present with right greater than left subarticular narrowing. Moderate foraminal stenosis is worse on the left. L5-S1: A shallow right paramedian disc protrusion is present. This potentially contacts the traversing right L5 nerve roots. Facet spurring contributes to moderate right foraminal stenosis. IMPRESSION: 1. Multiple enhancing lesions throughout the lumbar spine, visualized sacrum and iliac bones compatible with metastatic disease or multiple myeloma. 2. No pathologic fractures. 3. Abnormal signal in the proximal left iliac artery raises concern for occlusion or slow flow. 4. 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 5. Mild left subarticular and moderate left foraminal stenosis at L2-3. 6. Mild subarticular and moderate foraminal stenosis bilaterally at L3-4, right greater than left. 7. Moderate to severe central canal stenosis at L4-5 with right greater than left subarticular narrowing. 8. Moderate foraminal stenosis bilaterally at L4-5 is worse on the left. 9. Shallow right paramedian disc protrusion at L5-S1 potentially contacts the traversing right L5 nerve roots. 10. Moderate right foraminal stenosis at L5-S1. These results were called by telephone at the time of interpretation on 12/07/2023 at 5:57 Pm to provider [facility], who verbally acknowledged these results. Electronically Signed   By: Audree Leas M.D.   On: 12/07/2023 18:17   CT Chest Wo Contrast Result Date: 12/07/2023 CLINICAL DATA:  Back pain, weakness, decreased appetite EXAM: CT CHEST WITHOUT CONTRAST TECHNIQUE: Multidetector CT imaging of the chest was performed following the standard protocol without IV contrast. RADIATION DOSE REDUCTION: This exam was performed according to the departmental dose-optimization program which includes automated exposure control, adjustment of the mA and/or kV according to patient size and/or use of iterative reconstruction technique.  COMPARISON:  12/07/2023, 08/28/2023 FINDINGS: Cardiovascular: Unenhanced imaging of the heart is unremarkable without pericardial effusion. 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 Dense atherosclerosis of the native coronary vasculature. Normal caliber of the thoracic aorta. Extensive atherosclerosis of the aorta and its branches. Assessment of the vascular lumen cannot be performed without IV contrast. Mediastinum/Nodes: No enlarged mediastinal or axillary lymph nodes. Thyroid  gland, trachea, and esophagus demonstrate no significant findings. Lungs/Pleura: Chronic elevation of the  left hemidiaphragm, with mild left lower lobe bronchiectasis and scarring again noted. There are also linear opacities within the left upper lobe consistent with scarring seen previously. However, there is some new patchy nodular airspace disease within the bilateral upper lobes, left greater than right, concerning for superimposed pneumonia. No effusion or pneumothorax. Central airways are patent. Upper Abdomen: Cholelithiasis without evidence of acute cholecystitis. No other acute upper abdominal findings. Musculoskeletal: The mixed sclerotic and lucent lesion within the T9 vertebral body with associated right paraspinal soft tissue component is again identified, measuring 2.4 x 2.5 cm reference image 98/2. Since the previous exam, it appears that there is slight invagination of the superior endplate of the T9 vertebral body on the right, consistent with superimposed pathologic fracture. Less than 10% loss of height. No retropulsion. Further evaluation with MRI may be useful given symptoms of back pain. There is a healing subacute to chronic right posterolateral seventh rib fracture with abundant callus formation, new since prior CT. No other acute bony abnormalities. Reconstructed images demonstrate no additional findings. IMPRESSION: 1. New fracture through the superior endplate of the T9 vertebral body at  the site of the mixed sclerotic and lucent lesion seen on prior CT and MRI exams, consistent with pathologic fracture. Further evaluation with MRI may be useful. 2. Patchy bilateral upper lobe nodular airspace disease consistent with inflammatory or infectious etiology. 3. Chronic areas of scarring or atelectasis within the left upper and left lower lobes, with stable elevation of the left hemidiaphragm. 4.  Aortic Atherosclerosis (ICD10-I70.0). Electronically Signed   By: Bobbye Burrow M.D.   On: 12/07/2023 16:39   DG Chest Portable 1 View Result Date: 12/07/2023 CLINICAL DATA:  Cough, back pain EXAM: PORTABLE CHEST - 1 VIEW COMPARISON:  08/09/2023 FINDINGS: Low lung volumes. Chronic linear scarring or atelectasis laterally in the left mid lung. No new infiltrate or overt edema. Heart size and mediastinal contours are within normal limits. Aortic Atherosclerosis (ICD10-170.0). CABG markers. No effusion. Sternotomy wires. IMPRESSION: Low volumes with chronic left mid lung scarring or atelectasis. Electronically Signed   By: Nicoletta Barrier M.D.   On: 12/07/2023 13:29    Labs:  CBC: Recent Labs    12/08/23 0452 12/10/23 0452 12/10/23 1700 12/11/23 0420  WBC 12.4* 10.6* 13.2* 11.1*  HGB 11.1* 10.7* 8.2* 9.5*  HCT 34.4* 33.7* 25.8* 29.9*  PLT 426* 390 314 361    COAGS: No results for input(s): "INR", "APTT" in the last 8760 hours.  BMP: Recent Labs    12/08/23 0452 12/10/23 0452 12/10/23 1700 12/11/23 0420  NA 137 138 135 137  K 4.6 3.7 2.8* 3.9  CL 102 102 107 107  CO2 23 25 18* 21*  GLUCOSE 147* 124* 156* 157*  BUN 39* 40* 37* 38*  CALCIUM  8.8* 8.7* 6.7* 8.1*  CREATININE 2.30* 2.44* 2.20* 2.28*  GFRNONAA 29* 27* 30* 29*    LIVER FUNCTION TESTS: Recent Labs    04/13/23 1227 12/07/23 1349 12/10/23 1700 12/11/23 0420  BILITOT 0.8 0.6 0.3 0.4  AST 21 12* 10* 11*  ALT 13 11 8 7   ALKPHOS 75 95 66 74  PROT 6.6 7.3 5.2* 5.9*  ALBUMIN  3.5 3.5 2.4* 2.8*    TUMOR MARKERS: No  results for input(s): "AFPTM", "CEA", "CA199", "CHROMGRNA" in the last 8760 hours.  Assessment and Plan: Per Dr. Milburn Aliment progress note on 12/18/23: "Metastatic (lytic) bone lesions: - Presentation: Right anterior chest wall pain since March 2025 - CT chest (12/07/2023): New fracture through the  superior endplate of T9 vertebral body at the site of mixed sclerotic and lucent lesion consistent with pathologic fracture.  Patchy bilateral upper lobe nodular airspace disease infectious/inflammatory etiology. - MRI thoracic spine (12/07/2023): Pathologic compression fracture at T9-10 with abnormal enhancement extending into the expanded right pedicle.  Multiple enhancing lesions throughout the thoracic spine.  Multiple rib lesions bilaterally without pathologic rib fracture. - MRI lumbar spine (12/07/2023): Multiple enhancing lesions throughout the lumbar spine, sacrum and iliac bones.  No pathological fracture. - CT angiogram chest (12/09/2020): No pulmonary embolism.  Multifocal pulmonary infiltrates, slightly improved in the left upper lobe.  Multiple lytic lesions within the T7 and T9 vertebral bodies with pathologic fracture of the T9 vertebral body and right 4th and 7th ribs and pathologic fracture of the right seventh rib."  Plan for IR biopsy of T9  All labs and medications are within acceptable parameters. No pertinent allergies. Patient has been NPO since midnight.   Patient presents for scheduled T9 biopsy in IR today.  Risks and benefits of thoracic 9 vertebra was discussed with the patient and/or patient's family including, but not limited to bleeding, infection, damage to adjacent structures or low yield requiring additional tests.  All of the questions were answered and there is agreement to proceed.  Consent signed and in chart.      Thank you for allowing our service to participate in [name] 's care.  Electronically Signed: Lovena Rubinstein, PA-C   12/26/2023, 6:07  AM      I spent a total of 30 Minutes in face to face in clinical consultation, greater than 50% of which was counseling/coordinating care for T9 lesion, with request for biopsy.

## 2023-12-26 NOTE — Procedures (Signed)
 INR.  Status post T9 vertebral body biopsy using fluoroscopy.  Right transpedicular approach.  2 cores of tissue obtained using a 16-gauge core biopsy needle.  Patient tolerated the procedure well.  No acute complications.  Jory Ng MD.

## 2023-12-27 ENCOUNTER — Ambulatory Visit (HOSPITAL_COMMUNITY)
Admission: RE | Admit: 2023-12-27 | Discharge: 2023-12-27 | Disposition: A | Discharge status: Home / Self Care | Source: Ambulatory Visit | Attending: Hematology | Admitting: Hematology

## 2023-12-27 DIAGNOSIS — C7951 Secondary malignant neoplasm of bone: Secondary | ICD-10-CM | POA: Diagnosis present

## 2023-12-27 MED ORDER — FLUDEOXYGLUCOSE F - 18 (FDG) INJECTION
7.0800 | Freq: Once | INTRAVENOUS | Status: AC | PRN
  Administered 2023-12-27: 7.08 via INTRAVENOUS

## 2023-12-28 ENCOUNTER — Other Ambulatory Visit: Payer: Self-pay

## 2023-12-28 ENCOUNTER — Encounter (HOSPITAL_COMMUNITY): Payer: Self-pay

## 2023-12-28 ENCOUNTER — Emergency Department (HOSPITAL_COMMUNITY)

## 2023-12-28 ENCOUNTER — Inpatient Hospital Stay (HOSPITAL_COMMUNITY)
Admission: EM | Admit: 2023-12-28 | Discharge: 2024-01-03 | DRG: 193 | Disposition: A | Discharge status: Home / Self Care | Attending: Family Medicine | Admitting: Family Medicine

## 2023-12-28 DIAGNOSIS — E1122 Type 2 diabetes mellitus with diabetic chronic kidney disease: Secondary | ICD-10-CM | POA: Diagnosis present

## 2023-12-28 DIAGNOSIS — Z888 Allergy status to other drugs, medicaments and biological substances status: Secondary | ICD-10-CM

## 2023-12-28 DIAGNOSIS — K2970 Gastritis, unspecified, without bleeding: Secondary | ICD-10-CM | POA: Diagnosis not present

## 2023-12-28 DIAGNOSIS — Z7982 Long term (current) use of aspirin: Secondary | ICD-10-CM

## 2023-12-28 DIAGNOSIS — S0993XA Unspecified injury of face, initial encounter: Secondary | ICD-10-CM | POA: Diagnosis not present

## 2023-12-28 DIAGNOSIS — E1165 Type 2 diabetes mellitus with hyperglycemia: Secondary | ICD-10-CM | POA: Diagnosis present

## 2023-12-28 DIAGNOSIS — I739 Peripheral vascular disease, unspecified: Secondary | ICD-10-CM | POA: Diagnosis present

## 2023-12-28 DIAGNOSIS — Z800 Family history of malignant neoplasm of digestive organs: Secondary | ICD-10-CM

## 2023-12-28 DIAGNOSIS — Z794 Long term (current) use of insulin: Secondary | ICD-10-CM | POA: Diagnosis not present

## 2023-12-28 DIAGNOSIS — D62 Acute posthemorrhagic anemia: Secondary | ICD-10-CM | POA: Diagnosis present

## 2023-12-28 DIAGNOSIS — D509 Iron deficiency anemia, unspecified: Secondary | ICD-10-CM | POA: Diagnosis present

## 2023-12-28 DIAGNOSIS — S2232XA Fracture of one rib, left side, initial encounter for closed fracture: Secondary | ICD-10-CM | POA: Diagnosis present

## 2023-12-28 DIAGNOSIS — N179 Acute kidney failure, unspecified: Secondary | ICD-10-CM | POA: Diagnosis present

## 2023-12-28 DIAGNOSIS — Z5941 Food insecurity: Secondary | ICD-10-CM

## 2023-12-28 DIAGNOSIS — W19XXXA Unspecified fall, initial encounter: Secondary | ICD-10-CM | POA: Diagnosis not present

## 2023-12-28 DIAGNOSIS — C799 Secondary malignant neoplasm of unspecified site: Secondary | ICD-10-CM | POA: Diagnosis not present

## 2023-12-28 DIAGNOSIS — M8448XA Pathological fracture, other site, initial encounter for fracture: Secondary | ICD-10-CM | POA: Diagnosis present

## 2023-12-28 DIAGNOSIS — K2100 Gastro-esophageal reflux disease with esophagitis, without bleeding: Secondary | ICD-10-CM | POA: Diagnosis not present

## 2023-12-28 DIAGNOSIS — Z9861 Coronary angioplasty status: Secondary | ICD-10-CM

## 2023-12-28 DIAGNOSIS — J440 Chronic obstructive pulmonary disease with acute lower respiratory infection: Secondary | ICD-10-CM | POA: Diagnosis present

## 2023-12-28 DIAGNOSIS — N184 Chronic kidney disease, stage 4 (severe): Secondary | ICD-10-CM | POA: Insufficient documentation

## 2023-12-28 DIAGNOSIS — C7951 Secondary malignant neoplasm of bone: Secondary | ICD-10-CM | POA: Diagnosis present

## 2023-12-28 DIAGNOSIS — Z7902 Long term (current) use of antithrombotics/antiplatelets: Secondary | ICD-10-CM | POA: Diagnosis not present

## 2023-12-28 DIAGNOSIS — Z8249 Family history of ischemic heart disease and other diseases of the circulatory system: Secondary | ICD-10-CM

## 2023-12-28 DIAGNOSIS — Z951 Presence of aortocoronary bypass graft: Secondary | ICD-10-CM

## 2023-12-28 DIAGNOSIS — J9601 Acute respiratory failure with hypoxia: Secondary | ICD-10-CM | POA: Diagnosis not present

## 2023-12-28 DIAGNOSIS — Z823 Family history of stroke: Secondary | ICD-10-CM

## 2023-12-28 DIAGNOSIS — Z5948 Other specified lack of adequate food: Secondary | ICD-10-CM

## 2023-12-28 DIAGNOSIS — Z79899 Other long term (current) drug therapy: Secondary | ICD-10-CM

## 2023-12-28 DIAGNOSIS — E869 Volume depletion, unspecified: Secondary | ICD-10-CM | POA: Diagnosis present

## 2023-12-28 DIAGNOSIS — G253 Myoclonus: Secondary | ICD-10-CM | POA: Diagnosis not present

## 2023-12-28 DIAGNOSIS — K209 Esophagitis, unspecified without bleeding: Secondary | ICD-10-CM | POA: Diagnosis not present

## 2023-12-28 DIAGNOSIS — Y92239 Unspecified place in hospital as the place of occurrence of the external cause: Secondary | ICD-10-CM | POA: Diagnosis not present

## 2023-12-28 DIAGNOSIS — B9710 Unspecified enterovirus as the cause of diseases classified elsewhere: Secondary | ICD-10-CM | POA: Diagnosis present

## 2023-12-28 DIAGNOSIS — I129 Hypertensive chronic kidney disease with stage 1 through stage 4 chronic kidney disease, or unspecified chronic kidney disease: Secondary | ICD-10-CM | POA: Diagnosis present

## 2023-12-28 DIAGNOSIS — K2971 Gastritis, unspecified, with bleeding: Secondary | ICD-10-CM | POA: Diagnosis present

## 2023-12-28 DIAGNOSIS — Z7984 Long term (current) use of oral hypoglycemic drugs: Secondary | ICD-10-CM

## 2023-12-28 DIAGNOSIS — J441 Chronic obstructive pulmonary disease with (acute) exacerbation: Secondary | ICD-10-CM | POA: Diagnosis present

## 2023-12-28 DIAGNOSIS — E782 Mixed hyperlipidemia: Secondary | ICD-10-CM | POA: Diagnosis present

## 2023-12-28 DIAGNOSIS — M898X9 Other specified disorders of bone, unspecified site: Secondary | ICD-10-CM | POA: Diagnosis not present

## 2023-12-28 DIAGNOSIS — K3189 Other diseases of stomach and duodenum: Secondary | ICD-10-CM | POA: Diagnosis not present

## 2023-12-28 DIAGNOSIS — E1140 Type 2 diabetes mellitus with diabetic neuropathy, unspecified: Secondary | ICD-10-CM | POA: Diagnosis present

## 2023-12-28 DIAGNOSIS — Z87891 Personal history of nicotine dependence: Secondary | ICD-10-CM | POA: Diagnosis not present

## 2023-12-28 DIAGNOSIS — E1151 Type 2 diabetes mellitus with diabetic peripheral angiopathy without gangrene: Secondary | ICD-10-CM | POA: Diagnosis present

## 2023-12-28 DIAGNOSIS — J181 Lobar pneumonia, unspecified organism: Secondary | ICD-10-CM | POA: Diagnosis present

## 2023-12-28 DIAGNOSIS — H9190 Unspecified hearing loss, unspecified ear: Secondary | ICD-10-CM | POA: Diagnosis present

## 2023-12-28 DIAGNOSIS — D649 Anemia, unspecified: Secondary | ICD-10-CM | POA: Diagnosis not present

## 2023-12-28 DIAGNOSIS — B9789 Other viral agents as the cause of diseases classified elsewhere: Secondary | ICD-10-CM | POA: Diagnosis present

## 2023-12-28 DIAGNOSIS — I25119 Atherosclerotic heart disease of native coronary artery with unspecified angina pectoris: Secondary | ICD-10-CM | POA: Diagnosis not present

## 2023-12-28 DIAGNOSIS — I2510 Atherosclerotic heart disease of native coronary artery without angina pectoris: Secondary | ICD-10-CM | POA: Diagnosis present

## 2023-12-28 DIAGNOSIS — K2091 Esophagitis, unspecified with bleeding: Secondary | ICD-10-CM | POA: Diagnosis not present

## 2023-12-28 DIAGNOSIS — J189 Pneumonia, unspecified organism: Secondary | ICD-10-CM | POA: Diagnosis present

## 2023-12-28 DIAGNOSIS — K920 Hematemesis: Secondary | ICD-10-CM | POA: Diagnosis not present

## 2023-12-28 DIAGNOSIS — Z8673 Personal history of transient ischemic attack (TIA), and cerebral infarction without residual deficits: Secondary | ICD-10-CM

## 2023-12-28 LAB — RESP PANEL BY RT-PCR (RSV, FLU A&B, COVID)  RVPGX2
Influenza A by PCR: NEGATIVE
Influenza B by PCR: NEGATIVE
Resp Syncytial Virus by PCR: NEGATIVE
SARS Coronavirus 2 by RT PCR: NEGATIVE

## 2023-12-28 LAB — URINALYSIS, ROUTINE W REFLEX MICROSCOPIC
Bacteria, UA: NONE SEEN
Bilirubin Urine: NEGATIVE
Glucose, UA: >=500 mg/dL — AB
Hgb urine dipstick: NEGATIVE
Ketones, ur: NEGATIVE mg/dL
Leukocytes,Ua: NEGATIVE
Nitrite: NEGATIVE
Protein, ur: NEGATIVE mg/dL
Specific Gravity, Urine: 1.012 (ref 1.005–1.030)
pH: 6 (ref 5.0–8.0)

## 2023-12-28 LAB — RESPIRATORY PANEL BY PCR

## 2023-12-28 LAB — CBC
HCT: 30.2 % — ABNORMAL LOW (ref 39.0–52.0)
Hemoglobin: 9.9 g/dL — ABNORMAL LOW (ref 13.0–17.0)
MCH: 25.9 pg — ABNORMAL LOW (ref 26.0–34.0)
MCHC: 32.8 g/dL (ref 30.0–36.0)
MCV: 79.1 fL — ABNORMAL LOW (ref 80.0–100.0)
Platelets: 475 10*3/uL — ABNORMAL HIGH (ref 150–400)
RBC: 3.82 MIL/uL — ABNORMAL LOW (ref 4.22–5.81)
RDW: 16.2 % — ABNORMAL HIGH (ref 11.5–15.5)
WBC: 15.1 10*3/uL — ABNORMAL HIGH (ref 4.0–10.5)
nRBC: 0 % (ref 0.0–0.2)

## 2023-12-28 LAB — BASIC METABOLIC PANEL WITH GFR
Anion gap: 14 (ref 5–15)
BUN: 44 mg/dL — ABNORMAL HIGH (ref 8–23)
CO2: 20 mmol/L — ABNORMAL LOW (ref 22–32)
Calcium: 9 mg/dL (ref 8.9–10.3)
Chloride: 99 mmol/L (ref 98–111)
Creatinine, Ser: 3.32 mg/dL — ABNORMAL HIGH (ref 0.61–1.24)
GFR, Estimated: 18 mL/min — ABNORMAL LOW (ref >60)
Glucose, Bld: 189 mg/dL — ABNORMAL HIGH (ref 70–99)
Potassium: 4.1 mmol/L (ref 3.5–5.1)
Sodium: 133 mmol/L — ABNORMAL LOW (ref 135–145)

## 2023-12-28 LAB — CK: Total CK: 67 U/L (ref 49–397)

## 2023-12-28 LAB — VITAMIN B12: Vitamin B-12: 472 pg/mL (ref 180–914)

## 2023-12-28 LAB — GLUCOSE, CAPILLARY
Glucose-Capillary: 178 mg/dL — ABNORMAL HIGH (ref 70–99)
Glucose-Capillary: 111 mg/dL — ABNORMAL HIGH (ref 70–99)
Glucose-Capillary: 183 mg/dL — ABNORMAL HIGH (ref 70–99)

## 2023-12-28 LAB — FOLATE: Folate: 15.6 ng/mL (ref >5.9)

## 2023-12-28 LAB — PROCALCITONIN: Procalcitonin: <0.1 ng/mL

## 2023-12-28 LAB — TROPONIN I (HIGH SENSITIVITY)
Troponin I (High Sensitivity): 5 ng/L (ref <18)
Troponin I (High Sensitivity): 4 ng/L (ref <18)

## 2023-12-28 LAB — TSH: TSH: 2.658 u[IU]/mL (ref 0.350–4.500)

## 2023-12-28 LAB — MRSA NEXT GEN BY PCR, NASAL: MRSA by PCR Next Gen: NOT DETECTED

## 2023-12-28 LAB — BRAIN NATRIURETIC PEPTIDE: B Natriuretic Peptide: 54 pg/mL (ref 0.0–100.0)

## 2023-12-28 MED ORDER — ACETAMINOPHEN 325 MG PO TABS: 650.0000 mg | ORAL_TABLET | Freq: Four times a day (QID) | ORAL | Status: DC | PRN

## 2023-12-28 MED ORDER — METOPROLOL TARTRATE 25 MG PO TABS
25.0000 mg | ORAL_TABLET | Freq: Two times a day (BID) | ORAL | Status: DC
Start: 2023-12-28 — End: 2023-12-31
  Administered 2023-12-28 – 2023-12-31 (×7): 25 mg via ORAL
  Filled 2023-12-28 (×7): qty 1

## 2023-12-28 MED ORDER — ONDANSETRON HCL 4 MG/2ML IJ SOLN
4.0000 mg | Freq: Four times a day (QID) | INTRAMUSCULAR | Status: DC | PRN
Start: 2023-12-28 — End: 2024-01-03
  Administered 2023-12-31 (×2): 4 mg via INTRAVENOUS
  Filled 2023-12-28 (×2): qty 2

## 2023-12-28 MED ORDER — TRAMADOL HCL 50 MG PO TABS: 50.0000 mg | ORAL_TABLET | Freq: Two times a day (BID) | ORAL | Status: DC | PRN

## 2023-12-28 MED ORDER — INSULIN ASPART 100 UNIT/ML IJ SOLN
0.0000 [IU] | Freq: Three times a day (TID) | INTRAMUSCULAR | Status: DC
  Administered 2023-12-28: 2 [IU] via SUBCUTANEOUS
  Administered 2023-12-29: 1 [IU] via SUBCUTANEOUS
  Administered 2023-12-29: 2 [IU] via SUBCUTANEOUS
  Administered 2023-12-29 – 2023-12-30 (×2): 1 [IU] via SUBCUTANEOUS
  Administered 2023-12-30: 5 [IU] via SUBCUTANEOUS
  Administered 2023-12-31: 7 [IU] via SUBCUTANEOUS
  Administered 2023-12-31: 5 [IU] via SUBCUTANEOUS
  Administered 2024-01-01 (×2): 3 [IU] via SUBCUTANEOUS
  Administered 2024-01-01: 2 [IU] via SUBCUTANEOUS
  Administered 2024-01-02: 1 [IU] via SUBCUTANEOUS
  Administered 2024-01-02 – 2024-01-03 (×3): 3 [IU] via SUBCUTANEOUS

## 2023-12-28 MED ORDER — SODIUM CHLORIDE 0.9 % IV SOLN
1.0000 g | Freq: Once | INTRAVENOUS | Status: AC
  Administered 2023-12-28: 1 g via INTRAVENOUS
  Filled 2023-12-28: qty 10

## 2023-12-28 MED ORDER — SODIUM CHLORIDE 0.9 % IV BOLUS
1000.0000 mL | Freq: Once | INTRAVENOUS | Status: AC
  Administered 2023-12-28: 1000 mL via INTRAVENOUS

## 2023-12-28 MED ORDER — HEPARIN SODIUM (PORCINE) 5000 UNIT/ML IJ SOLN
5000.0000 [IU] | Freq: Three times a day (TID) | INTRAMUSCULAR | Status: DC
  Administered 2023-12-28 – 2023-12-31 (×8): 5000 [IU] via SUBCUTANEOUS
  Filled 2023-12-28 (×8): qty 1

## 2023-12-28 MED ORDER — VITAMIN B-12 1000 MCG PO TABS
1000.0000 ug | ORAL_TABLET | Freq: Every morning | ORAL | Status: DC
  Administered 2023-12-28 – 2024-01-03 (×7): 1000 ug via ORAL
  Filled 2023-12-28 (×7): qty 1

## 2023-12-28 MED ORDER — INSULIN ASPART 100 UNIT/ML IJ SOLN
0.0000 [IU] | Freq: Every day | INTRAMUSCULAR | Status: DC
  Administered 2023-12-30: 4 [IU] via SUBCUTANEOUS
  Administered 2023-12-31 – 2024-01-02 (×3): 3 [IU] via SUBCUTANEOUS

## 2023-12-28 MED ORDER — GABAPENTIN 300 MG PO CAPS
300.0000 mg | ORAL_CAPSULE | Freq: Two times a day (BID) | ORAL | Status: DC
  Administered 2023-12-28 – 2024-01-01 (×9): 300 mg via ORAL
  Filled 2023-12-28 (×9): qty 1

## 2023-12-28 MED ORDER — SODIUM CHLORIDE 0.9 % IV SOLN: INTRAVENOUS | Status: AC

## 2023-12-28 MED ORDER — ASPIRIN 81 MG PO TBEC
81.0000 mg | DELAYED_RELEASE_TABLET | Freq: Every day | ORAL | Status: DC
  Administered 2023-12-29 – 2023-12-30 (×2): 81 mg via ORAL
  Filled 2023-12-28 (×3): qty 1

## 2023-12-28 MED ORDER — BUDESONIDE 0.5 MG/2ML IN SUSP
0.5000 mg | Freq: Two times a day (BID) | RESPIRATORY_TRACT | Status: DC
  Administered 2023-12-28 – 2024-01-03 (×11): 0.5 mg via RESPIRATORY_TRACT
  Filled 2023-12-28 (×12): qty 2

## 2023-12-28 MED ORDER — ARFORMOTEROL TARTRATE 15 MCG/2ML IN NEBU
15.0000 ug | INHALATION_SOLUTION | Freq: Two times a day (BID) | RESPIRATORY_TRACT | Status: DC
  Administered 2023-12-28 – 2024-01-03 (×11): 15 ug via RESPIRATORY_TRACT
  Filled 2023-12-28 (×12): qty 2

## 2023-12-28 MED ORDER — HYDROCODONE BIT-HOMATROP MBR 5-1.5 MG/5ML PO SOLN
5.0000 mL | ORAL | Status: DC | PRN
  Administered 2023-12-28 (×3): 5 mL via ORAL
  Filled 2023-12-28 (×3): qty 5

## 2023-12-28 MED ORDER — CILOSTAZOL 100 MG PO TABS
100.0000 mg | ORAL_TABLET | Freq: Two times a day (BID) | ORAL | Status: DC
  Administered 2023-12-28 – 2024-01-03 (×13): 100 mg via ORAL
  Filled 2023-12-28 (×16): qty 1

## 2023-12-28 MED ORDER — IPRATROPIUM-ALBUTEROL 0.5-2.5 (3) MG/3ML IN SOLN
3.0000 mL | Freq: Four times a day (QID) | RESPIRATORY_TRACT | Status: DC
  Administered 2023-12-28 – 2023-12-29 (×5): 3 mL via RESPIRATORY_TRACT
  Filled 2023-12-28 (×5): qty 3

## 2023-12-28 MED ORDER — LIDOCAINE 5 % EX PTCH
1.0000 | MEDICATED_PATCH | Freq: Once | CUTANEOUS | Status: AC
Start: 2023-12-28 — End: 2023-12-28
  Administered 2023-12-28: 1 via TRANSDERMAL
  Filled 2023-12-28: qty 1

## 2023-12-28 MED ORDER — ATORVASTATIN CALCIUM 40 MG PO TABS
40.0000 mg | ORAL_TABLET | Freq: Every day | ORAL | Status: DC
Start: 2023-12-28 — End: 2024-01-03
  Administered 2023-12-28 – 2024-01-02 (×6): 40 mg via ORAL
  Filled 2023-12-28 (×6): qty 1

## 2023-12-28 MED ORDER — SODIUM CHLORIDE 0.9 % IV SOLN
2.0000 g | INTRAVENOUS | Status: DC
  Administered 2023-12-28 – 2024-01-01 (×5): 2 g via INTRAVENOUS
  Filled 2023-12-28 (×6): qty 12.5

## 2023-12-28 MED ORDER — CLOPIDOGREL BISULFATE 75 MG PO TABS
75.0000 mg | ORAL_TABLET | Freq: Every morning | ORAL | Status: DC
  Administered 2023-12-28 – 2023-12-31 (×4): 75 mg via ORAL
  Filled 2023-12-28 (×4): qty 1

## 2023-12-28 MED ORDER — INSULIN GLARGINE-YFGN 100 UNIT/ML ~~LOC~~ SOLN
5.0000 [IU] | Freq: Every day | SUBCUTANEOUS | Status: DC
  Administered 2023-12-28 – 2023-12-30 (×3): 5 [IU] via SUBCUTANEOUS
  Filled 2023-12-28 (×4): qty 0.05

## 2023-12-28 MED ORDER — GUAIFENESIN-DM 100-10 MG/5ML PO SYRP
5.0000 mL | ORAL_SOLUTION | Freq: Once | ORAL | Status: AC
  Administered 2023-12-28: 5 mL via ORAL
  Filled 2023-12-28: qty 5

## 2023-12-28 MED ORDER — AZITHROMYCIN 250 MG PO TABS
500.0000 mg | ORAL_TABLET | Freq: Every day | ORAL | Status: DC
  Administered 2023-12-29 – 2024-01-01 (×4): 500 mg via ORAL
  Filled 2023-12-28 (×4): qty 2

## 2023-12-28 MED ORDER — ACETAMINOPHEN 650 MG RE SUPP: 650.0000 mg | Freq: Four times a day (QID) | RECTAL | Status: DC | PRN

## 2023-12-28 MED ORDER — ONDANSETRON HCL 4 MG PO TABS
4.0000 mg | ORAL_TABLET | Freq: Four times a day (QID) | ORAL | Status: DC | PRN
Start: 2023-12-28 — End: 2024-01-03

## 2023-12-28 MED ORDER — SODIUM CHLORIDE 0.9 % IV SOLN
500.0000 mg | Freq: Once | INTRAVENOUS | Status: AC
  Administered 2023-12-28: 500 mg via INTRAVENOUS
  Filled 2023-12-28: qty 5

## 2023-12-28 MED ORDER — CYCLOBENZAPRINE HCL 10 MG PO TABS
10.0000 mg | ORAL_TABLET | Freq: Once | ORAL | Status: AC
  Administered 2023-12-28: 10 mg via ORAL
  Filled 2023-12-28: qty 1

## 2023-12-28 NOTE — Hospital Course (Addendum)
 78 year old male with a history of coronary artery disease status post CABG, hypertension, diabetes mellitus type 2, hyperlipidemia, CKD stage IV, 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 intake for the last several days.  He states that his coughing has worsened since 12/27/2023.  This has resulted in him having left-sided chest pain.  He states that the chest pain is elicited only when he coughs.  He denies any hemoptysis.  He does have a little worsening of his shortness of breath.  Notably, the patient was recently mated to the hospital from 12/10/2023 to 12/11/2023 secondary to syncope which was felt to be orthostatic in nature.  In addition, the patient had hospital admission from 12/07/2023 to 12/10/2023 secondary to pneumonia.  He was discharged home with levofloxacin . During his April admission for pneumonia, CT of the chest on 12/10/2023 was negative for PE but showed a pathologic fracture of T9 vertebra.  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  He has subsequently followed up with Dr. Cheree Cords.  He had a biopsy of his T9 vertebra on 12/26/2023.  Results are pending at this time.  Since his discharge from the hospital, the patient follow-up with his PCP.  The patient was started on Augmentin  and doxycycline  without improvement.  Multiple Myeloma Panel fron 12/07/23 showed elevated Alpha 2 globulin  at 1.3 and M-spike was not observed. Urine protein electrophoresis from 12/07/23 was normal.  His tumor markers including AFP, CA 19-9, and CEA were unremarkable.  His PSA was 3.89. His kappa and lambda free light chains were elevated, but kappa/lambda ratio was normal on 12/18/2023.  In the ED, the patient was afebrile hemodynamically stable with oxygen saturation 96% room air.  WBC 15.1, hemoglobin 9.9,  platelets 475.  Sodium 133, potassium 4.1, bicarbonate 20, serum creatinine 3.32.  The patient was started on azithromycin  and ceftriaxone .

## 2023-12-28 NOTE — ED Triage Notes (Signed)
 Pov from home. Cc of pneumonia. Recently dc with it says he feels worse. C/o chest pain when he coughs. 8/10

## 2023-12-28 NOTE — ED Notes (Signed)
 97% after ambulating to tx room from lobby.

## 2023-12-28 NOTE — H&P (Signed)
 History and Physical    Patient: [name]:[id number] DOB: [birth year] DOA: 12/28/2023 DOS: the patient was seen and examined on 12/28/2023 PCP: [name]  Patient coming from: Home  Chief Complaint:  Chief Complaint  Patient presents with   Pneumonia   HPI: [name] is a 78 year old male with a history of coronary artery disease status post CABG, hypertension, diabetes mellitus type 2, hyperlipidemia, CKD stage IV, 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 intake for the last several days.  He states that his coughing has worsened since 12/27/2023.  This has resulted in him having left-sided chest pain.  He states that the chest pain is elicited only when he coughs.  He denies any hemoptysis.  He does have a little worsening of his shortness of breath.  Notably, the patient was recently mated to the hospital from 12/10/2023 to 12/11/2023 secondary to syncope which was felt to be orthostatic in nature.  In addition, the patient had hospital admission from 12/07/2023 to 12/10/2023 secondary to pneumonia.  He was discharged home with levofloxacin . During his April admission for pneumonia, CT of the chest on 12/10/2023 was negative for PE but showed a pathologic fracture of T9 vertebra.  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  He has subsequently followed up with Dr. Cheree Cords.  He had a biopsy of his T9 vertebra on 12/26/2023.  Results are pending at this time.  Since his discharge from the hospital, the patient follow-up with his PCP.  The patient was started on Augmentin  and doxycycline  without improvement.  Multiple Myeloma Panel fron 12/07/23 showed elevated Alpha 2 globulin  at 1.3 and M-spike was not observed. Urine protein electrophoresis from 12/07/23 was normal.  His tumor  markers including AFP, CA 19-9, and CEA were unremarkable.  His PSA was 3.89. His kappa and lambda free light chains were elevated, but kappa/lambda ratio was normal on 12/18/2023.  In the ED, the patient was afebrile hemodynamically stable with oxygen saturation 96% room air.  WBC 15.1, hemoglobin 9.9, platelets 475.  Sodium 133, potassium 4.1, bicarbonate 20, serum creatinine 3.32.  The patient was started on azithromycin  and ceftriaxone .  Review of Systems: As mentioned in the history of present illness. All other systems reviewed and are negative. 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   x 5   Hypercholesteremia    Hypertension    Neuropathy    Peripheral vascular disease (HCC)    s/p R-L FEM-FEM BYPASS   Pneumonia    Tachycardia    after CABG, pt on medicine for this   Past Surgical History:  Procedure Laterality Date   ABDOMINAL AORTOGRAM Bilateral 09/19/2016   Procedure: iliac;  Surgeon: Margherita Shell, MD;  Location: [facility];  Service: Cardiovascular;  Laterality: Bilateral;   ABDOMINAL AORTOGRAM W/LOWER EXTREMITY N/A 09/22/2021   Procedure: ABDOMINAL AORTOGRAM W/LOWER EXTREMITY;  Surgeon: Young Hensen, MD;  Location: [facility];  Service: Cardiovascular;  Laterality: N/A;   ABDOMINAL AORTOGRAM  W/LOWER EXTREMITY N/A 10/05/2022   Procedure: ABDOMINAL AORTOGRAM W/LOWER EXTREMITY;  Surgeon: Young Hensen, MD;  Location: [facility] INVASIVE CV LAB;  Service: Cardiovascular;  Laterality: N/A;   BIOPSY  05/16/2023   Procedure: BIOPSY;  Surgeon: Suzette Espy, MD;  [facility]: [facility];  Service: Endoscopy;;   CARDIAC  CATHETERIZATION  2003   with stent   CARDIAC CATHETERIZATION N/A 10/11/2015   Procedure: Left Heart Cath and Coronary Angiography;  Surgeon: Peter M Swaziland, MD;  Location: [facility] INVASIVE CV LAB;  Service: Cardiovascular;  Laterality: N/A;   COLON RESECTION N/A 12/13/2017   Procedure: EXPLORATORY LAPAROTOMY, SIGMOID COLECTOMY WITH COLOSTOMY;  Surgeon: Dareen Ebbing, MD;  Location: [facility] OR;  Service: General;  Laterality: N/A;   COLONOSCOPY N/A 09/22/2013   Procedure: COLONOSCOPY;  Surgeon: Suzette Espy, MD;  [facility]: [facility];  Service: Endoscopy;  Laterality: N/A;  9:30 AM   COLONOSCOPY N/A 02/19/2018   Procedure: COLONOSCOPY;  Surgeon: Suzette Espy, MD;  [facility]: [facility];  Service: Endoscopy;  Laterality: N/A;  2:00pm   COLONOSCOPY WITH PROPOFOL  N/A 05/16/2023   Procedure: COLONOSCOPY WITH PROPOFOL ;  Surgeon: Suzette Espy, MD;  [facility];  Service: Endoscopy;  Laterality: N/A;  730am, asa 3   COLOSTOMY N/A 12/13/2017   Procedure: COLOSTOMY;  Surgeon: Dareen Ebbing, MD;  [facility] OR;  Service: General;  Laterality: N/A;   COLOSTOMY REVERSAL N/A 03/26/2018   Procedure: COLOSTOMY REVERSAL;  Surgeon: Dareen Ebbing, MD;  Location: [facility] OR;  Service: General;  Laterality: N/A;   CORONARY ARTERY BYPASS GRAFT N/A 10/18/2015   Procedure: CORONARY ARTERY BYPASS GRAFTING (CABG) x  five, using left internal mammary artery and right leg greater saphenous vein harvested endoscopically;  Surgeon: Zelphia Higashi, MD;  Location: [facility] OR;  Service: Open Heart Surgery;  Laterality: N/A;   ENDARTERECTOMY FEMORAL Right 08/09/2016   Procedure: ENDARTERECTOMY FEMORAL WITH VEIN PATCH ANGIOPLASTY;  Surgeon: Margherita Shell, MD;  Location: [facility] OR;  Service: Vascular;  Laterality: Right;   ESOPHAGOGASTRODUODENOSCOPY N/A 10/24/2017   Dr. Riley Cheadle: hiatal hernia   ESOPHAGOGASTRODUODENOSCOPY (EGD) WITH PROPOFOL  N/A 05/16/2023   Procedure: ESOPHAGOGASTRODUODENOSCOPY (EGD) WITH PROPOFOL ;   Surgeon: Suzette Espy, MD;  [facility];  Service: Endoscopy;  Laterality: N/A;   FEMORAL-FEMORAL BYPASS GRAFT Bilateral 08/09/2016   Procedure: REVISION BYPASS GRAFT RIGHT FEMORAL-LEFT FEMORAL ARTERY;  Surgeon: Margherita Shell, MD;  Location: [facility] OR;  Service: Vascular;  Laterality: Bilateral;   FEMORAL-POPLITEAL BYPASS GRAFT     INTRAMEDULLARY (IM) NAIL INTERTROCHANTERIC Left 04/03/2021   Procedure: LEFT  HIP INTRAMEDULLARY (IM) NAIL INTERTROCHANTRIC;  Surgeon: Arnie Lao, MD;  Location: [facility] OR;  Service: Orthopedics;  Laterality: Left;   IR FLUORO GUIDED NEEDLE PLC ASPIRATION/INJECTION LOC  12/26/2023   MALONEY DILATION N/A 05/16/2023   Procedure: MALONEY DILATION;  Surgeon: Suzette Espy, MD;  [facility];  Service: Endoscopy;  Laterality: N/A;   PERIPHERAL VASCULAR BALLOON ANGIOPLASTY Right 09/22/2021   Procedure: PERIPHERAL VASCULAR BALLOON ANGIOPLASTY;  Surgeon: Young Hensen, MD;  [facility]: [facility];  Service: Cardiovascular;  Laterality: Right;   PERIPHERAL VASCULAR CATHETERIZATION N/A 05/08/2016   Procedure: Lower Extremity Angiography;  Surgeon: Avanell Leigh, MD;  Location: [facility] INVASIVE CV LAB;  Service: Cardiovascular;  Laterality: N/A;   PERIPHERAL VASCULAR INTERVENTION Right 09/19/2016   Procedure: Peripheral Vascular Intervention;  Surgeon: Margherita Shell, MD;  Location: [facility];  Service: Cardiovascular;  Laterality: Right;  ext iliac stent   PERIPHERAL VASCULAR INTERVENTION Right 09/22/2021   Procedure: PERIPHERAL VASCULAR INTERVENTION;  Surgeon: Young Hensen, MD;  Location: [facility];  Service: Cardiovascular;  Laterality: Right;   POLYPECTOMY  02/19/2018   Procedure: POLYPECTOMY;  Surgeon: Suzette Espy, MD;  [facility];  Service: Endoscopy;;   PROCTOSCOPY  10/24/2017   Procedure: PROCTOSCOPY;  Surgeon: Suzette Espy, MD;  [facility];  Service: Endoscopy;;    TEE WITHOUT CARDIOVERSION N/A 10/18/2015   Procedure: TRANSESOPHAGEAL ECHOCARDIOGRAM (TEE);  Surgeon: Zelphia Higashi, MD;  Location: [facility] OR;  Service: Open Heart Surgery;  Laterality: N/A;   Social History:  reports that he quit smoking about 23 years ago. His smoking use included cigarettes. He started smoking about 53 years ago. He has a 60 pack-year smoking history. He has never used smokeless tobacco. He reports that he does not drink alcohol  and does not use drugs.  Allergies  Allergen Reactions   Acarbose Diarrhea    Family History  Problem Relation Age of Onset   Heart attack Mother    Stroke Mother    Colon cancer Brother        late 37s    Prior to Admission medications   Medication Sig Start Date End Date Taking? Authorizing Provider  amLODipine  (NORVASC ) 10 MG tablet Take 10 mg by mouth in the morning.    [provider]  amoxicillin -clavulanate (AUGMENTIN ) 875-125 MG tablet Take 1 tablet by mouth 2 (two) times daily. 12/25/23   [provider]  aspirin  EC 81 MG tablet Take 1 tablet (81 mg total) by mouth daily with breakfast. 05/23/19   Colin Dawley, MD  atorvastatin  (LIPITOR ) 40 MG tablet Take 1 tablet (40 mg total) by mouth at bedtime. 12/11/23   Johnson, Clanford L, MD  baclofen (LIORESAL) 10 MG tablet Take 10 mg by mouth at bedtime. 12/05/23   [provider]  cilostazol  (PLETAL ) 100 MG tablet Take 100 mg by mouth 2 (two) times daily.    [provider]  clopidogrel  (PLAVIX ) 75 MG tablet Take 75 mg by mouth in the morning.    [provider]  doxycycline  (VIBRA -TABS) 100 MG tablet Take 100 mg by mouth 2 (two) times daily. 12/19/23   [provider]  empagliflozin  (JARDIANCE ) 25 MG TABS tablet 12.5 mg daily as directed 12/11/23   Faustino Hook L, MD  gabapentin  (NEURONTIN ) 600 MG tablet Take 600 mg by mouth 2 (two) times daily.    [provider]  insulin  glargine (LANTUS ) 100 UNIT/ML injection Inject  0.1 mLs (10 Units total) into the skin at bedtime. 12/11/23   Johnson, Clanford L, MD  losartan  (COZAAR ) 50 MG tablet Take 50 mg by mouth daily. 03/02/22   [provider]  metoprolol  tartrate (LOPRESSOR ) 50 MG tablet Take 25 mg by mouth 2 (two) times daily.     [provider]  pantoprazole  (PROTONIX ) 40 MG tablet Take 1 tablet (40 mg total) by mouth daily. 12/11/23   Rayfield Cairo, MD  traMADol  (ULTRAM ) 50 MG tablet Take 0.5 tablets (25 mg total) by mouth every 12 (twelve) hours as needed for severe pain (pain score 7-10). 12/11/23 12/10/24  Johnson, Clanford L, MD  vitamin B-12 (CYANOCOBALAMIN ) 1000 MCG tablet Take 1,000 mcg by mouth in the morning.    [provider]    Physical Exam: Vitals:   12/28/23 0830 12/28/23 0900 12/28/23 0930 12/28/23 1015  BP: (!) 148/64 138/88 134/61 Aaron Aas)  154/63  Pulse: 86 92 84 81  Resp: 11 17 17 13   Temp:      TempSrc:      SpO2: 97% 96% 95% 93%  Weight:      Height:       HEENT:  [place]/AT, No thrush, no icterus CV:  RRR, no rub, no S3, no S4 Lung:  bibasilar rales. Minimal basilar wheeze Abd:  soft/+BS, NT Ext:  No edema, no lymphangitis, no synovitis, no rash  Data Reviewed: Data reviewed above in history  Assessment and Plan: Lobar pneumonia -12/28/2023 CT chest--new heterogenous opacities in the left lung; continued improvement of the opacities in the right upper lobe; Redemonstration of osseous metastases, essentially similar to the prior study, including pathological fracture of T9 vertebral body and right seventh rib. - Start cefepime  and azithromycin  - MRSA screen - Check PCT - Viral respiratory panel - Start DuoNebs -hycodan for cough  Atypical chest pain - Secondary to left rib fracture - CT chest shows--minimally displaced fracture of the anteromedial left sixth rib, present since the prior study. No metastatic lesion seenat the fracture site. - Judicious opioids - Antitussives  Acute on chronic renal  failure--CKD stage IV - Baseline creatinine 2.2-2.5 - Presented with serum creatinine 3.32 - Secondary to volume depletion - Start IV fluids  Diabetes mellitus type 2 - NovoLog  sliding scale - start reduced dose semglee  - 12/11/23 A1C--7.6  Peripheral vascular disease - Continue statin and Pletal   Mixed hyperlipidemia - Continue statin  Essential hypertension - Continue metoprolol  - Holding amlodipine , losartan       Advance Care Planning: FULL  Consults: none  Family Communication: spouse 5/16  Severity of Illness: The appropriate patient status for this patient is INPATIENT. Inpatient status is judged to be reasonable and necessary in order to provide the required intensity of service to ensure the patient's safety. The patient's presenting symptoms, physical exam findings, and initial radiographic and laboratory data in the context of their chronic comorbidities is felt to place them at high risk for further clinical deterioration. Furthermore, it is not anticipated that the patient will be medically stable for discharge from the hospital within 2 midnights of admission.   * I certify that at the point of admission it is my clinical judgment that the patient will require inpatient hospital care spanning beyond 2 midnights from the point of admission due to high intensity of service, high risk for further deterioration and high frequency of surveillance required.*  Author: Demaris Fillers, MD 12/28/2023 10:32 AM  For on call review [email or web address].

## 2023-12-28 NOTE — ED Provider Notes (Signed)
 [place] [facility] DEPARTMENT AT [facility] Provider Note  CSN: [id number] Arrival date & time: 12/28/23 8413  Chief Complaint(s) Pneumonia  HPI [name] is a 78 y.o. male with past medical history as below, significant for CAD, CKD3, GERD, CABGx5, HLD, HTN, metastatic disease  who presents to the ED with complaint of cp, cough  Pt with recent admission for PNA last April, he had re-admission for orthostatic hypotension shortly after discharge. Reports has been feeling progressively worse since discharge. Ongoing cough with occ sputum/ yellow. Ongoing left side anterior chest pain. Fatigue, chills, malaise. He reports poor water  intake chronically but feels is worsened in last week or so. No emesis/diarrhea. Had orthopnea last 24 hours. CP primarily when coughing.   Past Medical History 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   x 5   Hypercholesteremia    Hypertension    Neuropathy    Peripheral vascular disease (HCC)    s/p R-L FEM-FEM BYPASS   Pneumonia    Tachycardia    after CABG, pt on medicine for this   Patient Active Problem List   Diagnosis Date Noted   Metastatic cancer to bone (HCC) 12/18/2023   Syncope and collapse 12/10/2023   Hypocalcemia 12/10/2023   Anemia 12/10/2023   Lytic bone lesions on xray 12/08/2023   Multifocal pneumonia 12/07/2023   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   H/O adenomatous polyp of colon 11/22/2022   Aortic stenosis 05/30/2022   Coronary artery disease involving coronary bypass graft of native heart without angina pectoris 11/15/2021   Pain and swelling of right elbow 04/23/2021    Fall    Closed nondisplaced intertrochanteric fracture of left femur (HCC)    Closed left hip fracture, initial encounter (HCC) 04/01/2021   CAP (community acquired pneumonia) 05/21/2019   Diarrhea 03/18/2019   Loss of weight 03/18/2019   Klebsiella sepsis (HCC) 09/15/2018   Urinary tract infection without hematuria    Acute lower UTI 09/13/2018   S/P colostomy takedown 03/26/2018   Hypertension 12/12/2017   Type 2 diabetes mellitus with diabetic neuropathy (HCC) 12/12/2017   CKD (chronic kidney disease), stage III (HCC) 12/12/2017   Hypercholesteremia 12/12/2017   Hypokalemia 12/12/2017   GI bleed 12/06/2017   Rectal bleed 12/06/2017   Uncontrolled type 2 diabetes mellitus with hyperglycemia, with long-term current use of insulin  (HCC) 12/06/2017   Acute renal failure superimposed on stage 3 chronic kidney disease (HCC) 12/06/2017   Malnutrition of moderate degree 12/06/2017   Abnormal CT scan, colon 11/23/2017   Constipation 11/23/2017   Lobar pneumonia (HCC) 10/02/2017   Diverticulitis large intestine 08/19/2017   Diverticulitis of large intestine with perforation and abscess without bleeding    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 08/16/2017   CKD (chronic kidney disease) stage 3, GFR 30-59 ml/min (HCC) 08/02/2017   Sepsis due to undetermined organism (HCC) 08/01/2017   GERD (gastroesophageal reflux disease) 08/01/2017   PAD (peripheral artery disease) (HCC) 08/09/2016   Claudication (HCC) 05/08/2016   HCAP (healthcare-associated pneumonia) 01/12/2016   Hyponatremia 01/12/2016   Abnormal chest CT 10/13/2015   Hx of CABG 10/13/2015   Type 2  diabetes, HbA1c goal < 7% (HCC)    Unstable angina (HCC)    Hypomagnesemia    Type 2 diabetes mellitus (HCC) 10/09/2015   ASCVD (arteriosclerotic cardiovascular disease) 10/09/2015   CKD (chronic kidney disease) stage 2, GFR 60-89 ml/min 10/09/2015   Sepsis (HCC) 07/09/2015   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 07/09/2015    PVD (peripheral vascular disease) (HCC) 07/09/2015   Essential hypertension 07/09/2015   Orthostatic hypotension 07/09/2015   Neuropathy, peripheral 07/09/2015   HLD (hyperlipidemia) 07/09/2015   AKI (acute kidney injury) (HCC) 07/09/2015   CAD S/P percutaneous coronary angioplasty 08/14/1996   Home Medication(s) Prior to Admission medications   Medication Sig Start Date End Date Taking? Authorizing Provider  amLODipine  (NORVASC ) 10 MG tablet Take 10 mg by mouth in the morning.    [provider]  aspirin  EC 81 MG tablet Take 1 tablet (81 mg total) by mouth daily with breakfast. 05/23/19   Emokpae, Courage, MD  atorvastatin  (LIPITOR ) 40 MG tablet Take 1 tablet (40 mg total) by mouth at bedtime. 12/11/23   Johnson, Clanford L, MD  baclofen (LIORESAL) 10 MG tablet Take 10 mg by mouth at bedtime. 12/05/23   [provider]  cilostazol  (PLETAL ) 100 MG tablet Take 100 mg by mouth 2 (two) times daily.    [provider]  clopidogrel  (PLAVIX ) 75 MG tablet Take 75 mg by mouth in the morning.    [provider]  empagliflozin  (JARDIANCE ) 25 MG TABS tablet 12.5 mg daily as directed 12/11/23   Faustino Hook L, MD  gabapentin  (NEURONTIN ) 600 MG tablet Take 600 mg by mouth 2 (two) times daily.    [provider]  insulin  glargine (LANTUS ) 100 UNIT/ML injection Inject 0.1 mLs (10 Units total) into the skin at bedtime. 12/11/23   Johnson, Clanford L, MD  losartan  (COZAAR ) 50 MG tablet Take 50 mg by mouth daily. 03/02/22   [provider]  metoprolol  tartrate (LOPRESSOR ) 50 MG tablet Take 25 mg by mouth 2 (two) times daily.     [provider]  pantoprazole  (PROTONIX ) 40 MG tablet Take 1 tablet (40 mg total) by mouth daily. 12/11/23   Johnson, Clanford L, MD  traMADol  (ULTRAM ) 50 MG tablet Take 0.5 tablets (25 mg total) by mouth every 12 (twelve) hours as needed for severe pain (pain score 7-10). 12/11/23 12/10/24  Johnson, Clanford L, MD  vitamin  B-12 (CYANOCOBALAMIN ) 1000 MCG tablet Take 1,000 mcg by mouth in the morning.    [provider]                                                                                                                                    Past Surgical History Past Surgical History:  Procedure Laterality Date   ABDOMINAL AORTOGRAM Bilateral 09/19/2016   Procedure: iliac;  Surgeon: Margherita Shell, MD;  Location: [facility] IN[facility] CV [facility];  Service: Cardiovascular;  Laterality:  Bilateral;   ABDOMINAL AORTOGRAM W/LOWER EXTREMITY N/A 09/22/2021   Procedure: ABDOMINAL AORTOGRAM W/LOWER EXTREMITY;  Surgeon: Young Hensen, MD;  Location: [facility];  Service: Cardiovascular;  Laterality: N/A;   ABDOMINAL AORTOGRAM W/LOWER EXTREMITY N/A 10/05/2022   Procedure: ABDOMINAL AORTOGRAM W/LOWER EXTREMITY;  Surgeon: Young Hensen, MD;  Location: [facility];  Service: Cardiovascular;  Laterality: N/A;   BIOPSY  05/16/2023   Procedure: BIOPSY;  Surgeon: Suzette Espy, MD;  Location: [facility];  Service: Endoscopy;;   CARDIAC CATHETERIZATION  2003   with stent   CARDIAC CATHETERIZATION N/A 10/11/2015   Procedure: Left Heart Cath and Coronary Angiography;  Surgeon: Peter M Swaziland, MD;  Location: [facility] INVASIVE CV LAB;  Service: Cardiovascular;  Laterality: N/A;   COLON RESECTION N/A 12/13/2017   Procedure: EXPLORATORY LAPAROTOMY, SIGMOID COLECTOMY WITH COLOSTOMY;  Surgeon: Dareen Ebbing, MD;  Location: [facility] OR;  Service: General;  Laterality: N/A;   COLONOSCOPY N/A 09/22/2013   Procedure: COLONOSCOPY;  Surgeon: Suzette Espy, MD;  Location: [facility];  Service: Endoscopy;  Laterality: N/A;  9:30 AM   COLONOSCOPY N/A 02/19/2018   Procedure: COLONOSCOPY;  Surgeon: Suzette Espy, MD;  Location: [facility];  Service: Endoscopy;  Laterality: N/A;  2:00pm   COLONOSCOPY WITH PROPOFOL  N/A 05/16/2023   Procedure: COLONOSCOPY WITH PROPOFOL ;  Surgeon: Suzette Espy, MD;  Location: [facility]  END[facility] SU[facility];  Service: Endoscopy;  Laterality: N/A;  730am, asa 3   COLOSTOMY N/A 12/13/2017   Procedure: COLOSTOMY;  Surgeon: Dareen Ebbing, MD;  Location: [facility] OR;  Service: General;  Laterality: N/A;   COLOSTOMY REVERSAL N/A 03/26/2018   Procedure: COLOSTOMY REVERSAL;  Surgeon: Dareen Ebbing, MD;  Location: [facility] OR;  Service: General;  Laterality: N/A;   CORONARY ARTERY BYPASS GRAFT N/A 10/18/2015   Procedure: CORONARY ARTERY BYPASS GRAFTING (CABG) x  five, using left internal mammary artery and right leg greater saphenous vein harvested endoscopically;  Surgeon: Zelphia Higashi, MD;  Location: [facility] OR;  Service: Open Heart Surgery;  Laterality: N/A;   ENDARTERECTOMY FEMORAL Right 08/09/2016   Procedure: ENDARTERECTOMY FEMORAL WITH VEIN PATCH ANGIOPLASTY;  Surgeon: Margherita Shell, MD;  Location: [facility] OR;  Service: Vascular;  Laterality: Right;   ESOPHAGOGASTRODUODENOSCOPY N/A 10/24/2017   Dr. Riley Cheadle: hiatal hernia   ESOPHAGOGASTRODUODENOSCOPY (EGD) WITH PROPOFOL  N/A 05/16/2023   Procedure: ESOPHAGOGASTRODUODENOSCOPY (EGD) WITH PROPOFOL ;  Surgeon: Suzette Espy, MD;  [facility];  Service: Endoscopy;  Laterality: N/A;   FEMORAL-FEMORAL BYPASS GRAFT Bilateral 08/09/2016   Procedure: REVISION BYPASS GRAFT RIGHT FEMORAL-LEFT FEMORAL ARTERY;  Surgeon: Margherita Shell, MD;  Location: [facility] OR;  Service: Vascular;  Laterality: Bilateral;   FEMORAL-POPLITEAL BYPASS GRAFT     INTRAMEDULLARY (IM) NAIL INTERTROCHANTERIC Left 04/03/2021   Procedure: LEFT  HIP INTRAMEDULLARY (IM) NAIL INTERTROCHANTRIC;  Surgeon: Arnie Lao, MD;  Location: [facility] OR;  Service: Orthopedics;  Laterality: Left;   IR FLUORO GUIDED NEEDLE PLC ASPIRATION/INJECTION LOC  12/26/2023   MALONEY DILATION N/A 05/16/2023   Procedure: MALONEY DILATION;  Surgeon: Suzette Espy, MD;  [facility];  Service: Endoscopy;  Laterality: N/A;   PERIPHERAL VASCULAR BALLOON ANGIOPLASTY Right 09/22/2021   Procedure:  PERIPHERAL VASCULAR BALLOON ANGIOPLASTY;  Surgeon: Young Hensen, MD;  Location: [facility];  Service: Cardiovascular;  Laterality: Right;   PERIPHERAL VASCULAR CATHETERIZATION N/A 05/08/2016   Procedure: Lower Extremity Angiography;  Surgeon: Avanell Leigh, MD;  Location: [facility] INVASIVE CV  LAB;  Service: Cardiovascular;  Laterality: N/A;   PERIPHERAL VASCULAR INTERVENTION Right 09/19/2016   Procedure: Peripheral Vascular Intervention;  Surgeon: Margherita Shell, MD;  Location: [facility];  Service: Cardiovascular;  Laterality: Right;  ext iliac stent   PERIPHERAL VASCULAR INTERVENTION Right 09/22/2021   Procedure: PERIPHERAL VASCULAR INTERVENTION;  Surgeon: Young Hensen, MD;  Location: [facility];  Service: Cardiovascular;  Laterality: Right;   POLYPECTOMY  02/19/2018   Procedure: POLYPECTOMY;  Surgeon: Suzette Espy, MD;  [facility];  Service: Endoscopy;;   PROCTOSCOPY  10/24/2017   Procedure: PROCTOSCOPY;  Surgeon: Suzette Espy, MD;  [facility];  Service: Endoscopy;;   TEE WITHOUT CARDIOVERSION N/A 10/18/2015   Procedure: TRANSESOPHAGEAL ECHOCARDIOGRAM (TEE);  Surgeon: Zelphia Higashi, MD;  Location: [facility] OR;  Service: Open Heart Surgery;  Laterality: N/A;   Family History Family History  Problem Relation Age of Onset   Heart attack Mother    Stroke Mother    Colon cancer Brother        late 83s    Social History Social History   Tobacco Use   Smoking status: Former    Current packs/day: 0.00    Average packs/day: 2.0 packs/day for 30.0 years (60.0 ttl pk-yrs)    Types: Cigarettes    Start date: 08/14/1970    Quit date: 08/14/2000    Years since quitting: 23.3   Smokeless tobacco: Never  Vaping Use   Vaping status: Never Used  Substance Use Topics   Alcohol  use: No   Drug use: No   Allergies Acarbose  Review of Systems A thorough review of systems was obtained and all systems are negative except as  noted in the HPI and PMH.   Physical Exam Vital Signs  I have reviewed the triage vital signs BP 138/66 (BP Location: Left Arm)   Pulse 89   Temp 97.8 F (36.6 C) (Oral)   Resp 11   Ht 5\' 7"  (1.702 m)   Wt 64 kg   SpO2 97%   BMI 22.08 kg/m  Physical Exam Vitals and nursing note reviewed.  Constitutional:      General: He is not in acute distress.    Appearance: He is well-developed.  HENT:     Head: Normocephalic and atraumatic.     Right Ear: External ear normal.     Left Ear: External ear normal.     Mouth/Throat:     Mouth: Mucous membranes are dry.  Eyes:     General: No scleral icterus. Cardiovascular:     Rate and Rhythm: Normal rate and regular rhythm.     Pulses: Normal pulses.     Heart sounds: Murmur heard.  Pulmonary:     Effort: Pulmonary effort is normal. No respiratory distress.     Breath sounds: Normal breath sounds.  Abdominal:     General: Abdomen is flat.     Palpations: Abdomen is soft.     Tenderness: There is no abdominal tenderness.  Musculoskeletal:     Cervical back: No rigidity.     Right lower leg: No edema.     Left lower leg: No edema.  Skin:    General: Skin is warm and dry.     Capillary Refill: Capillary refill takes less than 2 seconds.  Neurological:     Mental Status: He is alert.  Psychiatric:        Mood and Affect: Mood normal.  Behavior: Behavior normal.     ED Results and Treatments Labs (all labs ordered are listed, but only abnormal results are displayed) Labs Reviewed  BASIC METABOLIC PANEL WITH GFR - Abnormal; Notable for the following components:      Result Value   Sodium 133 (*)    CO2 20 (*)    Glucose, Bld 189 (*)    BUN 44 (*)    Creatinine, Ser 3.32 (*)    GFR, Estimated 18 (*)    All other components within normal limits  CBC - Abnormal; Notable for the following components:   WBC 15.1 (*)    RBC 3.82 (*)    Hemoglobin 9.9 (*)    HCT 30.2 (*)    MCV 79.1 (*)    MCH 25.9 (*)    RDW 16.2  (*)    Platelets 475 (*)    All other components within normal limits  URINALYSIS, ROUTINE W REFLEX MICROSCOPIC - Abnormal; Notable for the following components:   Color, Urine STRAW (*)    Glucose, UA >=500 (*)    All other components within normal limits  RESP PANEL BY RT-PCR (RSV, FLU A&B, COVID)  RVPGX2  EXPECTORATED SPUTUM ASSESSMENT W GRAM STAIN, RFLX TO RESP C  BRAIN NATRIURETIC PEPTIDE  TROPONIN I (HIGH SENSITIVITY)  TROPONIN I (HIGH SENSITIVITY)                                                                                                                          Radiology CT Chest Wo Contrast Result Date: 12/28/2023 CLINICAL DATA:  Chest pain, nonspecific ongoing chest pain, post pna. * Tracking Code: BO * EXAM: CT CHEST WITHOUT CONTRAST TECHNIQUE: Multidetector CT imaging of the chest was performed following the standard protocol without IV contrast. RADIATION DOSE REDUCTION: This exam was performed according to the departmental dose-optimization program which includes automated exposure control, adjustment of the mA and/or kV according to patient size and/or use of iterative reconstruction technique. COMPARISON:  PET-CT scan from 12/27/2023 and CT angiography chest from 12/10/2023. FINDINGS: Cardiovascular: Normal cardiac size. No pericardial effusion. No aortic aneurysm. There are coronary artery calcifications, in keeping with coronary artery disease. There are also moderate peripheral atherosclerotic vascular calcifications of thoracic aorta and its major branches. 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 Mediastinum/Nodes: Visualized thyroid  gland appears grossly unremarkable. No solid / cystic mediastinal masses. The esophagus is nondistended precluding optimal assessment. There are few mildly prominent mediastinal lymph nodes, which do not meet the size criteria for lymphadenopathy and appear grossly similar to the prior study, favoring benign etiology. No axillary  lymphadenopathy by size criteria. Evaluation of bilateral hila is limited due to lack on intravenous contrast: however, no large hilar lymphadenopathy identified. Lungs/Pleura: The central tracheo-bronchial tree is patent. There are patchy, heterogeneous, irregular opacities predominantly involving the left upper lobe, which are new since the prior study. There are also new heterogeneous scattered opacities in the left lung lower lobe. There is continued interval  improvement in the opacities in the right upper lobe. Several areas of subsegmental atelectasis throughout bilateral lungs are essentially unchanged in the interim. No new dense consolidation, lung collapse or pleural effusion. Evaluation for lung nodule is limited due to background lung parenchymal changes. Upper Abdomen: Small volume dependent calcified gallstones noted without imaging signs of acute cholecystitis. Remaining visualized upper abdominal viscera within normal limits. Musculoskeletal: The visualized soft tissues of the chest wall are grossly unremarkable. Redemonstration of mixed sclerotic/lytic lesions in the T7 and T9 vertebral body, essentially similar to the prior study. There is pathological fracture with mild compression deformity of T9 vertebral body. There is also ill-defined lytic lesion in the posterolateral right fourth rib (series 2, image 54), unchanged. Subacute/healing pathological fracture of right seventh rib also appears essentially unchanged. There is acute minimally displaced fracture of the anteromedial left sixth rib (series 2, image 79, which was present on the prior exam as well. There are mild multilevel degenerative changes in the visualized spine. IMPRESSION: 1. There are new heterogeneous opacities in the left lung, as described above, which are nonspecific and may represent multifocal pneumonia. There is continued interval improvement in the opacities in the right upper lobe. 2. Redemonstration of osseous  metastases, essentially similar to the prior study, including pathological fracture of T9 vertebral body and right seventh rib. 3. There is minimally displaced fracture of the anteromedial left sixth rib, present since the prior study. No metastatic lesion seen at the fracture site. 4. Multiple other nonacute observations, as described above. Aortic Atherosclerosis (ICD10-I70.0). Electronically Signed   By: Beula Brunswick M.D.   On: 12/28/2023 08:42   DG Chest 2 View Result Date: 12/28/2023 CLINICAL DATA:  Shortness of breath. EXAM: CHEST - 2 VIEW COMPARISON:  12/10/2023 FINDINGS: Low lung volumes. Stable asymmetric elevation left hemidiaphragm. Streaky density at the left base is similar to prior compatible with atelectasis or scarring. Posterior displaced fracture noted in a mid right rib, likely ribs 7. There is some bony callus at the fracture site suggesting nonacute injury. No pneumothorax or substantial pleural effusion. Telemetry leads overlie the chest. IMPRESSION: 1. Low lung volumes with streaky density at the left base compatible with atelectasis or scarring. 2. Nonacute posterior displaced fracture in a mid right rib, likely rib 7. Electronically Signed   By: Donnal Fusi M.D.   On: 12/28/2023 07:27    Pertinent labs & imaging results that were available during my care of the patient were reviewed by me and considered in my medical decision making (see MDM for details).  Medications Ordered in ED Medications  cefTRIAXone  (ROCEPHIN ) 1 g in sodium chloride  0.9 % 100 mL IVPB (has no administration in time range)  azithromycin  (ZITHROMAX ) 500 mg in sodium chloride  0.9 % 250 mL IVPB (has no administration in time range)  lidocaine  (LIDODERM ) 5 % 1 patch (has no administration in time range)  guaiFENesin -dextromethorphan  (ROBITUSSIN DM) 100-10 MG/5ML syrup 5 mL (has no administration in time range)  cyclobenzaprine (FLEXERIL) tablet 10 mg (has no administration in time range)  sodium chloride   0.9 % bolus 1,000 mL (1,000 mLs Intravenous New Bag/Given 12/28/23 0810)  Procedures .Critical Care  Performed by: Teddi Favors, DO Authorized by: Teddi Favors, DO   Critical care provider statement:    Critical care time (minutes):  30   Critical care time was exclusive of:  Separately billable procedures and treating other patients   Critical care was necessary to treat or prevent imminent or life-threatening deterioration of the following conditions:  Dehydration and renal failure   Critical care was time spent personally by me on the following activities:  Development of treatment plan with patient or surrogate, discussions with consultants, evaluation of patient's response to treatment, examination of patient, ordering and review of laboratory studies, ordering and review of radiographic studies, ordering and performing treatments and interventions, pulse oximetry, re-evaluation of patient's condition, review of old charts and obtaining history from patient or surrogate   Care discussed with: admitting provider     (including critical care time)  Medical Decision Making / ED Course    Medical Decision Making:    ANTWANE VONHOLTEN is a 78 y.o. male  with past medical history as below, significant for CAD, CKD3, GERD, CABGx5, HLD, HTN, metastatic disease  who presents to the ED with complaint of cp, cough. The complaint involves an extensive differential diagnosis and also carries with it a high risk of complications and morbidity.  Serious etiology was considered. Ddx includes but is not limited to: In my evaluation of this patient's dyspnea my DDx includes, but is not limited to, pneumonia, pulmonary embolism, pneumothorax, pulmonary edema, metabolic acidosis, asthma, COPD, cardiac cause, anemia, anxiety, etc.    Complete initial physical exam performed,  notably the patient was in NAD, no hypoxia.    Reviewed and confirmed nursing documentation for past medical history, family history, social history.  Vital signs reviewed.     Brief summary:  78 year old male, history of CAD, CKD, metastatic cancer here with chest pain, dyspnea, fatigue, malaise Recent admission for pneumonia last month Reports symptoms have progressively worsened since discharge Seen by his PCP on Monday and started on antibiotics, no improvement  Clinical Course as of 12/28/23 0954  Fri Dec 28, 2023  0730 Creatinine(!): 3.32 Baseline around 2.2 [SG]  0744 WBC(!): 15.1 He has no fever, does have ongoing cough, CXR w/o PNA, will get CT chest  [SG]  0938 Left 6th rib fx on CT, approx location of his pain w/ coughing [SG]  0946 Imaging concerning for PNA, he has 1 SIRS (leukocytosis) does not meet criteria for sepsis/severe sepsis [SG]  0946 Hemoglobin(!): 9.9 Similar to prior  [SG]    Clinical Course User Index [SG] Teddi Favors, DO     Patient has AKI, CT concerning for recurrence of multifocal pneumonia.  Start antibiotics.  He is not septic.  He has an ongoing left-sided chest wall pain, worse with coughing, does have 6th rib fracture on imaging that correlates with the area of discomfort.  Will notify Dr Cheree Cords of pt admission  Will admit for AKI, pneumonia              Additional history obtained: -Additional history obtained from spouse -External records from outside source obtained and reviewed including: Chart review including previous notes, labs, imaging, consultation notes including  Prior admission Home meds Prior imaging Oncology documentation (Dr Kendra Pavy)   Lab Tests: -I ordered, reviewed, and interpreted labs.   The pertinent results include:   Labs Reviewed  BASIC METABOLIC PANEL WITH GFR - Abnormal; Notable for the following components:  Result Value   Sodium 133 (*)    CO2 20 (*)    Glucose, Bld 189 (*)     BUN 44 (*)    Creatinine, Ser 3.32 (*)    GFR, Estimated 18 (*)    All other components within normal limits  CBC - Abnormal; Notable for the following components:   WBC 15.1 (*)    RBC 3.82 (*)    Hemoglobin 9.9 (*)    HCT 30.2 (*)    MCV 79.1 (*)    MCH 25.9 (*)    RDW 16.2 (*)    Platelets 475 (*)    All other components within normal limits  URINALYSIS, ROUTINE W REFLEX MICROSCOPIC - Abnormal; Notable for the following components:   Color, Urine STRAW (*)    Glucose, UA >=500 (*)    All other components within normal limits  RESP PANEL BY RT-PCR (RSV, FLU A&B, COVID)  RVPGX2  EXPECTORATED SPUTUM ASSESSMENT W GRAM STAIN, RFLX TO RESP C  BRAIN NATRIURETIC PEPTIDE  TROPONIN I (HIGH SENSITIVITY)  TROPONIN I (HIGH SENSITIVITY)    Notable for WBC+, AKI  EKG   EKG Interpretation Date/Time:    Ventricular Rate:    PR Interval:    QRS Duration:    QT Interval:    QTC Calculation:   R Axis:      Text Interpretation:           Imaging Studies ordered: I ordered imaging studies including CXR CT chest I independently visualized the following imaging with scope of interpretation limited to determining acute life threatening conditions related to emergency care; findings noted above I agree with the radiologist interpretation If any imaging was obtained with contrast I closely monitored patient for any possible adverse reaction a/w contrast administration in the emergency department   Medicines ordered and prescription drug management: Meds ordered this encounter  Medications   sodium chloride  0.9 % bolus 1,000 mL   cefTRIAXone  (ROCEPHIN ) 1 g in sodium chloride  0.9 % 100 mL IVPB    Antibiotic Indication::   CAP   azithromycin  (ZITHROMAX ) 500 mg in sodium chloride  0.9 % 250 mL IVPB    Antibiotic Indication::   CAP   lidocaine  (LIDODERM ) 5 % 1 patch   guaiFENesin -dextromethorphan  (ROBITUSSIN DM) 100-10 MG/5ML syrup 5 mL   cyclobenzaprine (FLEXERIL) tablet 10 mg    -I  have reviewed the patients home medicines and have made adjustments as needed   Consultations Obtained: I requested consultation with the hospitalist,  and discussed lab and imaging findings as well as pertinent plan   Cardiac Monitoring: The patient was maintained on a cardiac monitor.  I personally viewed and interpreted the cardiac monitored which showed an underlying rhythm of: nsr Continuous pulse oximetry interpreted by myself, 98% on RA.    Social Determinants of Health:  Diagnosis or treatment significantly limited by social determinants of health: former smoker   Reevaluation: After the interventions noted above, I reevaluated the patient and found that they have improved  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    Final Clinical Impression(s) / ED Diagnoses Final diagnoses:  AKI (acute kidney injury) (HCC)  Closed fracture of one rib of left side, initial encounter  Multifocal pneumonia  Metastatic malignant neoplasm, unspecified site [facility])        Teddi Favors, DO 12/28/23 [phone]

## 2023-12-29 DIAGNOSIS — E1165 Type 2 diabetes mellitus with hyperglycemia: Secondary | ICD-10-CM | POA: Diagnosis not present

## 2023-12-29 DIAGNOSIS — N179 Acute kidney failure, unspecified: Secondary | ICD-10-CM | POA: Diagnosis not present

## 2023-12-29 DIAGNOSIS — C7951 Secondary malignant neoplasm of bone: Secondary | ICD-10-CM | POA: Diagnosis not present

## 2023-12-29 DIAGNOSIS — J181 Lobar pneumonia, unspecified organism: Secondary | ICD-10-CM | POA: Diagnosis not present

## 2023-12-29 DIAGNOSIS — N184 Chronic kidney disease, stage 4 (severe): Secondary | ICD-10-CM

## 2023-12-29 LAB — GLUCOSE, CAPILLARY
Glucose-Capillary: 134 mg/dL — ABNORMAL HIGH (ref 70–99)
Glucose-Capillary: 147 mg/dL — ABNORMAL HIGH (ref 70–99)
Glucose-Capillary: 193 mg/dL — ABNORMAL HIGH (ref 70–99)
Glucose-Capillary: 165 mg/dL — ABNORMAL HIGH (ref 70–99)

## 2023-12-29 LAB — CBC
HCT: 30.1 % — ABNORMAL LOW (ref 39.0–52.0)
Hemoglobin: 9.2 g/dL — ABNORMAL LOW (ref 13.0–17.0)
MCH: 24.8 pg — ABNORMAL LOW (ref 26.0–34.0)
MCHC: 30.6 g/dL (ref 30.0–36.0)
MCV: 81.1 fL (ref 80.0–100.0)
Platelets: 458 10*3/uL — ABNORMAL HIGH (ref 150–400)
RBC: 3.71 MIL/uL — ABNORMAL LOW (ref 4.22–5.81)
RDW: 16.1 % — ABNORMAL HIGH (ref 11.5–15.5)
WBC: 9.4 10*3/uL (ref 4.0–10.5)
nRBC: 0 % (ref 0.0–0.2)

## 2023-12-29 LAB — BASIC METABOLIC PANEL WITH GFR
Anion gap: 11 (ref 5–15)
BUN: 38 mg/dL — ABNORMAL HIGH (ref 8–23)
CO2: 20 mmol/L — ABNORMAL LOW (ref 22–32)
Calcium: 8.7 mg/dL — ABNORMAL LOW (ref 8.9–10.3)
Chloride: 104 mmol/L (ref 98–111)
Creatinine, Ser: 2.85 mg/dL — ABNORMAL HIGH (ref 0.61–1.24)
GFR, Estimated: 22 mL/min — ABNORMAL LOW (ref >60)
Glucose, Bld: 138 mg/dL — ABNORMAL HIGH (ref 70–99)
Potassium: 3.6 mmol/L (ref 3.5–5.1)
Sodium: 135 mmol/L (ref 135–145)

## 2023-12-29 LAB — T4, FREE: Free T4: 1.05 ng/dL (ref 0.61–1.12)

## 2023-12-29 LAB — MAGNESIUM: Magnesium: 2.2 mg/dL (ref 1.7–2.4)

## 2023-12-29 MED ORDER — ALBUTEROL SULFATE (2.5 MG/3ML) 0.083% IN NEBU: 2.5000 mg | INHALATION_SOLUTION | RESPIRATORY_TRACT | Status: DC | PRN

## 2023-12-29 MED ORDER — ACETAMINOPHEN 500 MG PO TABS
1000.0000 mg | ORAL_TABLET | Freq: Three times a day (TID) | ORAL | Status: DC
  Administered 2023-12-29 – 2024-01-03 (×13): 1000 mg via ORAL
  Filled 2023-12-29 (×13): qty 2

## 2023-12-29 MED ORDER — IPRATROPIUM-ALBUTEROL 0.5-2.5 (3) MG/3ML IN SOLN
3.0000 mL | Freq: Three times a day (TID) | RESPIRATORY_TRACT | Status: AC
  Administered 2023-12-30 (×3): 3 mL via RESPIRATORY_TRACT
  Filled 2023-12-29 (×3): qty 3

## 2023-12-29 MED ORDER — HYDROXYZINE HCL 25 MG PO TABS: 25.0000 mg | ORAL_TABLET | Freq: Three times a day (TID) | ORAL | Status: DC | PRN

## 2023-12-29 MED ORDER — POTASSIUM CHLORIDE IN NACL 20-0.9 MEQ/L-% IV SOLN: INTRAVENOUS | Status: AC

## 2023-12-29 NOTE — Progress Notes (Signed)
 PROGRESS NOTE  [name] UEA:[id number] DOB: 01-06-1946 DOA: 12/28/2023 PCP: [name]  Brief History:  78 year old male with a history of coronary artery disease status post CABG, hypertension, diabetes mellitus type 2, hyperlipidemia, CKD stage IV, 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 intake for the last several days.  He states that his coughing has worsened since 12/27/2023.  This has resulted in him having left-sided chest pain.  He states that the chest pain is elicited only when he coughs.  He denies any hemoptysis.  He does have a little worsening of his shortness of breath.  Notably, the patient was recently mated to the hospital from 12/10/2023 to 12/11/2023 secondary to syncope which was felt to be orthostatic in nature.  In addition, the patient had hospital admission from 12/07/2023 to 12/10/2023 secondary to pneumonia.  He was discharged home with levofloxacin . During his April admission for pneumonia, CT of the chest on 12/10/2023 was negative for PE but showed a pathologic fracture of T9 vertebra.  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  He has subsequently followed up with Dr. Cheree Cords.  He had a biopsy of his T9 vertebra on 12/26/2023.  Results are pending at this time.  Since his discharge from the hospital, the patient follow-up with his PCP.  The patient was started on Augmentin  and doxycycline  without improvement.  Multiple Myeloma Panel fron 12/07/23 showed elevated Alpha 2 globulin  at 1.3 and M-spike was not observed. Urine protein electrophoresis from 12/07/23 was normal.  His tumor markers including AFP, CA 19-9, and CEA were unremarkable.  His PSA was 3.89. His kappa and lambda free light chains were elevated, but kappa/lambda ratio was normal on  12/18/2023.  In the ED, the patient was afebrile hemodynamically stable with oxygen saturation 96% room air.  WBC 15.1, hemoglobin 9.9, platelets 475.  Sodium 133, potassium 4.1, bicarbonate 20, serum creatinine 3.32.  The patient was started on azithromycin  and ceftriaxone .   Assessment/Plan: Lobar pneumonia -12/28/2023 CT chest--new heterogenous opacities LUL and LLL; continued improvement of the opacities in the right upper lobe; Redemonstration of osseous metastases, essentially similar to the prior study, including pathological fracture of T9 vertebral body and right seventh rib. - continue cefepime  and azithromycin  another 24 hours - MRSA screen--neg - Check PCT <0.10 - Viral respiratory panel +rhino/enterovirus - continue DuoNebs -hycodan for cough - add brovana  and pulmicort    Atypical chest pain - Secondary to left rib fracture - CT chest shows--minimally displaced fracture of the anteromedial left sixth rib, present since the prior study. No metastatic lesion seenat the fracture site. - Judicious opioids - started hycodan - troponin 5>>4   Acute on chronic renal failure--CKD stage IV - Baseline creatinine 2.2-2.5 - Presented with serum creatinine 3.32 - Secondary to volume depletion - continue IV fluids>>improving   Diabetes mellitus type 2 - NovoLog  sliding scale - start reduced dose semglee  - 12/11/23 A1C--7.6   Peripheral vascular disease - Continue statin and Pletal  - continue ASA/Plavix    Mixed hyperlipidemia - Continue statin   Essential hypertension - Continue metoprolol  - Holding amlodipine , losartan    Generalized weakness -Multifactorial including deconditioning, infectious process, COPD exacerbation, and possibly metastatic cancer - B12--472 - Folate 15.6 - TSH 2.658 - CPK 67       Family Communication:   spouse at  bedside 5/17  Consultants:  none  Code Status:  FULL   DVT Prophylaxis:  [place] Heparin    Procedures: As Listed in Progress  Note Above  Antibiotics: Cefepime  5/16>> Azithro 5/16>>     Subjective: Patient is breathing about 10% better.  He was able to ambulate the halls today without any dizziness.  He has nonproductive cough.  He denies any nausea, vomiting or direct abdominal pain.  His appetite is poor.  Objective: Vitals:   12/29/23 0750 12/29/23 0811 12/29/23 1318 12/29/23 1516  BP:  (!) 112/56  131/62  Pulse:  (!) 104  (!) 104  Resp:    17  Temp:    98.3 F (36.8 C)  TempSrc:    Oral  SpO2: 93%  96% 90%  Weight:      Height:        Intake/Output Summary (Last 24 hours) at 12/29/2023 1804 Last data filed at 12/29/2023 0948 Gross per 24 hour  Intake 1888.78 ml  Output --  Net 1888.78 ml   Weight change:  Exam:  General:  Pt is alert, follows commands appropriately, not in acute distress HEENT: No icterus, No thrush, No neck mass, [place]/AT Cardiovascular: RRR, S1/S2, no rubs, no gallops Respiratory: Diminished breath sounds bilateral.  Bibasilar rales.  Minimal basilar wheeze Abdomen: Soft/+BS, non tender, non distended, no guarding Extremities: No edema, No lymphangitis, No petechiae, No rashes, no synovitis   Data Reviewed: I have personally reviewed following labs and imaging studies Basic Metabolic Panel: Recent Labs  Lab 12/28/23 0706 12/29/23 0352  NA 133* 135  K 4.1 3.6  CL 99 104  CO2 20* 20*  GLUCOSE 189* 138*  BUN 44* 38*  CREATININE 3.32* 2.85*  CALCIUM  9.0 8.7*  MG  --  2.2   Liver Function Tests: No results for input(s): "AST", "ALT", "ALKPHOS", "BILITOT", "PROT", "ALBUMIN " in the last 168 hours. No results for input(s): "LIPASE", "AMYLASE" in the last 168 hours. No results for input(s): "AMMONIA" in the last 168 hours. Coagulation Profile: Recent Labs  Lab 12/26/23 0805  INR 1.1   CBC: Recent Labs  Lab 12/26/23 0805 12/28/23 0706 12/29/23 0352  WBC 10.9* 15.1* 9.4  HGB 10.7* 9.9* 9.2*  HCT 32.8* 30.2* 30.1*  MCV 78.3* 79.1* 81.1  PLT 540* 475*  458*   Cardiac Enzymes: Recent Labs  Lab 12/28/23 1100  CKTOTAL 67   BNP: Invalid input(s): "POCBNP" CBG: Recent Labs  Lab 12/28/23 1658 12/28/23 2117 12/29/23 0748 12/29/23 1110 12/29/23 1638  GLUCAP 111* 183* 134* 147* 193*   HbA1C: No results for input(s): "HGBA1C" in the last 72 hours. Urine analysis:    Component Value Date/Time   COLORURINE STRAW (A) 12/28/2023 0936   APPEARANCEUR CLEAR 12/28/2023 0936   LABSPEC 1.012 12/28/2023 0936   PHURINE 6.0 12/28/2023 0936   GLUCOSEU >=500 (A) 12/28/2023 0936   HGBUR NEGATIVE 12/28/2023 0936   BILIRUBINUR NEGATIVE 12/28/2023 0936   KETONESUR NEGATIVE 12/28/2023 0936   PROTEINUR NEGATIVE 12/28/2023 0936   UROBILINOGEN 0.2 03/16/2015 0120   NITRITE NEGATIVE 12/28/2023 0936   LEUKOCYTESUR NEGATIVE 12/28/2023 0936   Sepsis Labs: @LABRCNTIP (procalcitonin:4,lacticidven:4) ) Recent Results (from the past 240 hours)  Resp panel by RT-PCR (RSV, Flu A&B, Covid) Anterior Nasal Swab     Status: None   Collection Time: 12/28/23  8:15 AM   Specimen: Anterior Nasal Swab  Result Value Ref Range Status   SARS Coronavirus 2 by RT PCR NEGATIVE NEGATIVE Final    Comment: (NOTE) SARS-CoV-2 target  nucleic acids are NOT DETECTED.  The SARS-CoV-2 RNA is generally detectable in upper respiratory specimens during the acute phase of infection. The lowest concentration of SARS-CoV-2 viral copies this assay can detect is 138 copies/mL. A negative result does not preclude SARS-Cov-2 infection and should not be used as the sole basis for treatment or other patient management decisions. A negative result may occur with  improper specimen collection/handling, submission of specimen other than nasopharyngeal swab, presence of viral mutation(s) within the areas targeted by this assay, and inadequate number of viral copies(<138 copies/mL). A negative result must be combined with clinical observations, patient history, and  epidemiological information. The expected result is Negative.  Fact Sheet for Patients:  BloggerCourse.com  Fact Sheet for Healthcare Providers:  SeriousBroker.it  This test is no t yet approved or cleared by the [place]  FDA and  has been authorized for detection and/or diagnosis of SARS-CoV-2 by FDA under an Emergency Use Authorization (EUA). This EUA will remain  in effect (meaning this test can be used) for the duration of the COVID-19 declaration under Section 564(b)(1) of the Act, 21 U.S.C.section 360bbb-3(b)(1), unless the authorization is terminated  or revoked sooner.       Influenza A by PCR NEGATIVE NEGATIVE Final   Influenza B by PCR NEGATIVE NEGATIVE Final    Comment: (NOTE) The Xpert Xpress SARS-CoV-2/FLU/RSV plus assay is intended as an aid in the diagnosis of influenza from Nasopharyngeal swab specimens and should not be used as a sole basis for treatment. Nasal washings and aspirates are unacceptable for Xpert Xpress SARS-CoV-2/FLU/RSV testing.  Fact Sheet for Patients: BloggerCourse.com  Fact Sheet for Healthcare Providers: SeriousBroker.it  This test is not yet approved or cleared by the [place]  FDA and has been authorized for detection and/or diagnosis of SARS-CoV-2 by FDA under an Emergency Use Authorization (EUA). This EUA will remain in effect (meaning this test can be used) for the duration of the COVID-19 declaration under Section 564(b)(1) of the Act, 21 U.S.C. section 360bbb-3(b)(1), unless the authorization is terminated or revoked.     Resp Syncytial Virus by PCR NEGATIVE NEGATIVE Final    Comment: (NOTE) Fact Sheet for Patients: BloggerCourse.com  Fact Sheet for Healthcare Providers: SeriousBroker.it  This test is not yet approved or cleared by the [place]  FDA and has been  authorized for detection and/or diagnosis of SARS-CoV-2 by FDA under an Emergency Use Authorization (EUA). This EUA will remain in effect (meaning this test can be used) for the duration of the COVID-19 declaration under Section 564(b)(1) of the Act, 21 U.S.C. section 360bbb-3(b)(1), unless the authorization is terminated or revoked.  Performed at [facility], [address] [place], [place] [address]   MRSA Next Gen by PCR, Nasal     Status: None   Collection Time: 12/28/23 11:48 AM   Specimen: Nasal Mucosa; Nasal Swab  Result Value Ref Range Status   MRSA by PCR Next Gen NOT DETECTED NOT DETECTED Final    Comment: (NOTE) The GeneXpert MRSA Assay (FDA approved for NASAL specimens only), is one component of a comprehensive MRSA colonization surveillance program. It is not intended to diagnose MRSA infection nor to guide or monitor treatment for MRSA infections. Test performance is not FDA approved in patients less than 55 years old. Performed at [facility], [address] [place], [place] [address]   Respiratory (~20 pathogens) panel by PCR     Status: Abnormal   Collection Time: 12/28/23 11:48 AM   Specimen: Nasopharyngeal Swab;  Respiratory  Result Value Ref Range Status   Adenovirus NOT DETECTED NOT DETECTED Final   Coronavirus 229E NOT DETECTED NOT DETECTED Final    Comment: (NOTE) The Coronavirus on the Respiratory Panel, DOES NOT test for the novel  Coronavirus (2019 nCoV)    Coronavirus HKU1 NOT DETECTED NOT DETECTED Final   Coronavirus NL63 NOT DETECTED NOT DETECTED Final   Coronavirus OC43 NOT DETECTED NOT DETECTED Final   Metapneumovirus NOT DETECTED NOT DETECTED Final   Rhinovirus / Enterovirus DETECTED (A) NOT DETECTED Final   Influenza A NOT DETECTED NOT DETECTED Final   Influenza B NOT DETECTED NOT DETECTED Final   Parainfluenza Virus 1 NOT DETECTED NOT DETECTED Final   Parainfluenza Virus 2 NOT DETECTED NOT DETECTED Final   Parainfluenza Virus 3 NOT DETECTED  NOT DETECTED Final   Parainfluenza Virus 4 NOT DETECTED NOT DETECTED Final   Respiratory Syncytial Virus NOT DETECTED NOT DETECTED Final   Bordetella pertussis NOT DETECTED NOT DETECTED Final   Bordetella Parapertussis NOT DETECTED NOT DETECTED Final   Chlamydophila pneumoniae NOT DETECTED NOT DETECTED Final   Mycoplasma pneumoniae NOT DETECTED NOT DETECTED Final    Comment: Performed at [facility], [address] [place], [place] [address]     Scheduled Meds:  arformoterol   15 mcg Nebulization BID   aspirin  EC  81 mg Oral Q breakfast   atorvastatin   40 mg Oral QHS   azithromycin   500 mg Oral Daily   budesonide  (PULMICORT ) nebulizer solution  0.5 mg Nebulization BID   cilostazol   100 mg Oral BID   clopidogrel   75 mg Oral q AM   cyanocobalamin   1,000 mcg Oral q AM   gabapentin   300 mg Oral BID   heparin   5,000 Units Subcutaneous Q8H   insulin  aspart  0-5 Units Subcutaneous QHS   insulin  aspart  0-9 Units Subcutaneous TID WC   insulin  glargine-yfgn  5 Units Subcutaneous QHS   ipratropium-albuterol   3 mL Nebulization Q6H   metoprolol  tartrate  25 mg Oral BID   Continuous Infusions:  ceFEPime  (MAXIPIME ) IV 2 g (12/29/23 1120)    Procedures/Studies: CT Chest Wo Contrast Result Date: 12/28/2023 CLINICAL DATA:  Chest pain, nonspecific ongoing chest pain, post pna. * Tracking Code: BO * EXAM: CT CHEST WITHOUT CONTRAST TECHNIQUE: Multidetector CT imaging of the chest was performed following the standard protocol without IV contrast. RADIATION DOSE REDUCTION: This exam was performed according to the departmental dose-optimization program which includes automated exposure control, adjustment of the mA and/or kV according to patient size and/or use of iterative reconstruction technique. COMPARISON:  PET-CT scan from 12/27/2023 and CT angiography chest from 12/10/2023. FINDINGS: Cardiovascular: Normal cardiac size. No pericardial effusion. No aortic aneurysm. There are coronary artery  calcifications, in keeping with coronary artery disease. There are also moderate peripheral atherosclerotic vascular calcifications of thoracic aorta and its major branches. 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 Mediastinum/Nodes: Visualized thyroid  gland appears grossly unremarkable. No solid / cystic mediastinal masses. The esophagus is nondistended precluding optimal assessment. There are few mildly prominent mediastinal lymph nodes, which do not meet the size criteria for lymphadenopathy and appear grossly similar to the prior study, favoring benign etiology. No axillary lymphadenopathy by size criteria. Evaluation of bilateral hila is limited due to lack on intravenous contrast: however, no large hilar lymphadenopathy identified. Lungs/Pleura: The central tracheo-bronchial tree is patent. There are patchy, heterogeneous, irregular opacities predominantly involving the left upper lobe, which are new since the prior study. There are  also new heterogeneous scattered opacities in the left lung lower lobe. There is continued interval improvement in the opacities in the right upper lobe. Several areas of subsegmental atelectasis throughout bilateral lungs are essentially unchanged in the interim. No new dense consolidation, lung collapse or pleural effusion. Evaluation for lung nodule is limited due to background lung parenchymal changes. Upper Abdomen: Small volume dependent calcified gallstones noted without imaging signs of acute cholecystitis. Remaining visualized upper abdominal viscera within normal limits. Musculoskeletal: The visualized soft tissues of the chest wall are grossly unremarkable. Redemonstration of mixed sclerotic/lytic lesions in the T7 and T9 vertebral body, essentially similar to the prior study. There is pathological fracture with mild compression deformity of T9 vertebral body. There is also ill-defined lytic lesion in the posterolateral right fourth rib (series 2, image 54),  unchanged. Subacute/healing pathological fracture of right seventh rib also appears essentially unchanged. There is acute minimally displaced fracture of the anteromedial left sixth rib (series 2, image 79, which was present on the prior exam as well. There are mild multilevel degenerative changes in the visualized spine. IMPRESSION: 1. There are new heterogeneous opacities in the left lung, as described above, which are nonspecific and may represent multifocal pneumonia. There is continued interval improvement in the opacities in the right upper lobe. 2. Redemonstration of osseous metastases, essentially similar to the prior study, including pathological fracture of T9 vertebral body and right seventh rib. 3. There is minimally displaced fracture of the anteromedial left sixth rib, present since the prior study. No metastatic lesion seen at the fracture site. 4. Multiple other nonacute observations, as described above. Aortic Atherosclerosis (ICD10-I70.0). Electronically Signed   By: Beula Brunswick M.D.   On: 12/28/2023 08:42   DG Chest 2 View Result Date: 12/28/2023 CLINICAL DATA:  Shortness of breath. EXAM: CHEST - 2 VIEW COMPARISON:  12/10/2023 FINDINGS: Low lung volumes. Stable asymmetric elevation left hemidiaphragm. Streaky density at the left base is similar to prior compatible with atelectasis or scarring. Posterior displaced fracture noted in a mid right rib, likely ribs 7. There is some bony callus at the fracture site suggesting nonacute injury. No pneumothorax or substantial pleural effusion. Telemetry leads overlie the chest. IMPRESSION: 1. Low lung volumes with streaky density at the left base compatible with atelectasis or scarring. 2. Nonacute posterior displaced fracture in a mid right rib, likely rib 7. Electronically Signed   By: Donnal Fusi M.D.   On: 12/28/2023 07:27   IR Fluoro Guide Ndl Plmt / BX Result Date: 12/27/2023 INDICATION: Enhancing abnormality of the T9 vertebral body on  recent MRI of the thoracic spine. EXAM: BONE BIOPSY AT T9 MEDICATIONS: None. ANESTHESIA/SEDATION: Moderate (conscious) sedation was employed during this procedure. A total of Versed  2 mg and Fentanyl  50 mcg was administered intravenously by the radiology nurse. Total intra-service moderate Sedation Time: 26 minutes. The patient's level of consciousness and vital signs were monitored continuously by radiology nursing throughout the procedure under my direct supervision. Fluoro time: 6 minutes 48 seconds.  272 mGy. COMPLICATIONS: None immediate. PROCEDURE: Informed written consent was obtained from the patient after a thorough discussion of the procedural risks, benefits and alternatives. All questions were addressed. Maximal Sterile Barrier Technique was utilized including caps, mask, sterile gowns, sterile gloves, sterile drape, hand hygiene and skin antiseptic. A timeout was performed prior to the initiation of the procedure. With the patient prone on the fluoroscopic table, the thoracolumbar region was prepped and draped in the usual manner. The T9 vertebral body was  identified on the AP and lateral projection by counting from the sacral region. The skin overlying the right pedicle was then infiltrated with 0.25% bupivacaine , and extended into the underlying paraspinal musculature. Using biplane intermittent fluoroscopy, a 13 gauge Cook spinal needle was advanced into the posterior aspect of T9. Through this, 2 passes were made with a 16 gauge core biopsy needle. Two core samples were obtained and sent in formalin for pathologic evaluation. Hemostasis was achieved at the skin entry site. Patient was then returned to short-stay for recovery. IMPRESSION: Status post fluoroscopic guided core biopsy at T9 vertebral body as described above without event. Electronically Signed   By: Luellen Sages M.D.   On: 12/27/2023 09:02   CT Angio Chest PE W and/or Wo Contrast Result Date: 12/10/2023 CLINICAL DATA:  High  probability pulmonary embolism, pneumonia, syncope EXAM: CT ANGIOGRAPHY CHEST WITH CONTRAST TECHNIQUE: Multidetector CT imaging of the chest was performed using the standard protocol during bolus administration of intravenous contrast. Multiplanar CT image reconstructions and MIPs were obtained to evaluate the vascular anatomy. RADIATION DOSE REDUCTION: This exam was performed according to the departmental dose-optimization program which includes automated exposure control, adjustment of the mA and/or kV according to patient size and/or use of iterative reconstruction technique. CONTRAST:  60mL OMNIPAQUE  IOHEXOL  350 MG/ML SOLN COMPARISON:  12/07/2023 FINDINGS: Cardiovascular: Status post coronary artery bypass grafting. Global cardiac size within limits. No pericardial effusion. Central pulmonary arteries are of normal caliber. No intraluminal filling defect identified through the segmental level to suggest acute pulmonary embolism. Extensive atherosclerotic calcification within the thoracic aorta. No aortic aneurysm. Mediastinum/Nodes: No enlarged mediastinal, hilar, or axillary lymph nodes. Thyroid  gland, trachea, and esophagus demonstrate no significant findings. Lungs/Pleura: Multifocal pulmonary infiltrates are again identified, slightly improved within the left upper lobe, but progressive within the right upper lobe in keeping with changes of multifocal pneumonia. Left lower lobe platelike atelectasis. No pneumothorax or pleural effusion. No central obstructing lesion. Upper Abdomen: Cholelithiasis without pericholecystic inflammatory change. No acute abnormality within visualized upper abdomen. 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 Musculoskeletal: Multiple lytic lesions are again identified within the T7 and T9 vertebral bodies, with pathologic fracture of the T9 vertebral body, and right 4 and 7 ribs,  pathologic fracture right seventh rib. The findings are in keeping with with metastatic disease, less likely multiple myeloma. These are better assessed on MRI examination 12/07/2023. Review of the MIP images confirms the above findings. IMPRESSION: 1. No acute pulmonary embolism. 2. Multifocal pulmonary infiltrates, slightly improved within the left upper lobe, but progressive within the right upper lobe in keeping with changes of multifocal pneumonia. 3. Multiple lytic lesions within the T7 and T9 vertebral bodies, with pathologic fracture of the T9 vertebral body, and right 4 and 7 ribs, and pathologic fracture of the right seventh rib. The findings are in keeping with metastatic disease, less likely multiple myeloma. These are better assessed on MRI examination 12/07/2023. 4. 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 Clinical correlation for signs and symptoms chronic mesenteric ischemia recommended. If indicated, this could be better assessed with dedicated CT arteriography. 5. Cholelithiasis. Electronically Signed   By: Worthy Heads M.D.   On: 12/10/2023 20:19   CT Head Wo Contrast Result Date: 12/10/2023 CLINICAL DATA:  Head trauma, minor (Age >= 65y).  Fall from chair. EXAM: CT HEAD WITHOUT CONTRAST TECHNIQUE: Contiguous axial  images were obtained from the base of the skull through the vertex without intravenous contrast. RADIATION DOSE REDUCTION: This exam was performed according to the departmental dose-optimization program which includes automated exposure control, adjustment of the mA and/or kV according to patient size and/or use of iterative reconstruction technique. COMPARISON:  Head CT 09/13/2018 and MRI 03/29/2018 FINDINGS: Brain: There is no evidence of an acute infarct, intracranial hemorrhage, mass, midline shift, or extra-axial fluid collection. There is mild cerebral atrophy. A normal  variant cavum septum pellucidum et vergae is noted. A lacunar infarct in the right corona radiata is new but chronic in appearance. Mild hypodensities elsewhere in the cerebral white matter bilaterally are nonspecific but compatible with chronic small vessel ischemia. Vascular: Calcified atherosclerosis at the skull base. No hyperdense vessel. Skull: No acute fracture or suspicious lesion. Sinuses/Orbits: Visualized paranasal sinuses are clear. Moderately large right mastoid effusion. Unremarkable orbits. Other: None. IMPRESSION: 1. No evidence of acute intracranial abnormality. 2. Mild chronic small vessel ischemia. Electronically Signed   By: Aundra Lee M.D.   On: 12/10/2023 20:10   DG Chest Portable 1 View Result Date: 12/10/2023 CLINICAL DATA:  Recent pneumonia.  Syncope. EXAM: PORTABLE CHEST 1 VIEW COMPARISON:  Chest radiograph dated 12/07/2023. FINDINGS: Shallow inspiration. Faint bilateral mid to lower lung field interstitial densities may represent atelectasis or infiltrate. No consolidative changes. No large pleural effusion or pneumothorax. Stable cardiac silhouette. Median sternotomy wires and CABG vascular clips. No acute osseous pathology. IMPRESSION: Shallow inspiration with bilateral mid to lower lung field atelectasis or infiltrate. Electronically Signed   By: Angus Bark M.D.   On: 12/10/2023 17:53   MR THORACIC SPINE W WO CONTRAST Result Date: 12/07/2023 CLINICAL DATA:  Thoracic spine compression fracture. Back pain. Decreased appetite. EXAM: MRI THORACIC WITHOUT AND WITH CONTRAST TECHNIQUE: Multiplanar and multiecho pulse sequences of the thoracic spine were obtained without and with intravenous contrast. CONTRAST:  6mL GADAVIST  GADOBUTROL  1 MMOL/ML IV SOLN COMPARISON:  CT chest without contrast 12/07/2023. MRI of the thoracic spine without and with contrast 02/22/2021 FINDINGS: Alignment: No significant listhesis is present. Leftward curvature of the thoracic spine is centered at  T8-9. Vertebrae: Pathologic compression fracture is confirmed at T9-10. Abnormal enhancement extending extends into an expanded right pedicle. Prominent lesion across the inferior right lateral aspect of the T7 vertebral body measures 2.2 x 1.4 cm. Extensive enhancement encompasses the majority of the T9 vertebral body with some sparing on the left. 11 mm rounded enhancing lesion is present in the left T4. Multiple lesions are present at T2, the largest anteriorly on the left along the inferior endplate measuring 11 mm. No other pathologic fractures are present. No significant extraosseous tumor is present. Multiple rib lesions are present bilaterally without a pathologic rib fracture. Cord: Central T2 hyperintensities noted in the cord at the T2-3 level. No other cord signal changes present. Cord morphology is within normal limits. Paraspinal and other soft tissues: The paraspinous soft tissues are within normal limits. The visualized lung fields are clear. No primary lesion is identified. Disc levels: No significant central disc protrusion or central canal stenosis is present. Facet hypertrophy contributes 2 right greater than left foraminal stenosis from T8-9 through T11-12. Moderate right foraminal stenosis is also present at T7-8. IMPRESSION: 1. Pathologic compression fracture at T9-10 with abnormal enhancement extending into an expanded right pedicle. 2. Multiple enhancing lesions throughout the thoracic spine as described compatible with metastatic disease or multiple myeloma. 3. Multiple rib lesions bilaterally without a pathologic rib fracture.  4. Central T2 hyperintensity in the cord at the T2-3 level is nonspecific but may represent a small focus of myelomalacia. 5. No significant central canal stenosis. 6. Facet hypertrophy contributes to right greater than left foraminal stenosis from T8-9 through T11-12. Moderate right foraminal stenosis is also present at T7-8. These results were called by telephone  at the time of interpretation on 12/07/2023 at 6:31 pm to provider JULIE HAVILAND , who verbally acknowledged these results. Electronically Signed   By: Audree Leas M.D.   On: 12/07/2023 18:31   MR Lumbar Spine W Wo Contrast Result Date: 12/07/2023 CLINICAL DATA:  Back pain and chills. EXAM: MRI LUMBAR SPINE WITHOUT AND WITH CONTRAST TECHNIQUE: Multiplanar and multiecho pulse sequences of the lumbar spine were obtained without and with intravenous contrast. CONTRAST:  6mL GADAVIST  GADOBUTROL  1 MMOL/ML IV SOLN COMPARISON:  CT of the abdomen and pelvis without contrast 04/13/2023 FINDINGS: Segmentation: 5 non rib-bearing lumbar type vertebral bodies are present. The lowest fully formed vertebral body is L5. Alignment: No significant listhesis is present. Rightward curvature of the lumbar spine is centered at L3. Vertebrae: Multiple enhancing lesions are present throughout the lumbar spine. Lesion anteriorly on the left subjacent to the superior endplate of L3 measures 3.1 x 2.6 x 1.7 cm. And a lesion inferiorly in anteriorly on the right measures 1.8 x 1.5 x 1.8 cm. No pathologic fractures are present. Multiple other smaller lesions are present throughout the lumbar spine, visualized sacrum and iliac bones. Conus medullaris and cauda equina: Conus extends to the L1 level. Conus and cauda equina appear normal. Paraspinal and other soft tissues: Simple cysts are present in the pole of both kidneys. The largest is on the right measuring 3 cm. Atherosclerotic changes are present in the aorta and branch vessels. Abnormal signal in the proximal left iliac artery raises concern for occlusion or slow flow. No significant adenopathy is present. No primary lesion is identified. Disc levels: L1-2: Mild disc bulging and facet hypertrophy is present. No significant stenosis is present. L2-3: A broad-based disc protrusion is asymmetric to the left. Mild facet hypertrophy is noted bilaterally. Mild left subarticular and  moderate left foraminal stenosis is present. L3-4: A broad-based disc protrusion is present. Moderate facet hypertrophy is worse on the right. This results in mild subarticular and moderate foraminal stenosis bilaterally, right greater than left. L4-5: A broad-based disc protrusion is present. Moderate facet hypertrophy is worse on the right. Moderate to severe central canal stenosis is present with right greater than left subarticular narrowing. Moderate foraminal stenosis is worse on the left. L5-S1: A shallow right paramedian disc protrusion is present. This potentially contacts the traversing right L5 nerve roots. Facet spurring contributes to moderate right foraminal stenosis. IMPRESSION: 1. Multiple enhancing lesions throughout the lumbar spine, visualized sacrum and iliac bones compatible with metastatic disease or multiple myeloma. 2. No pathologic fractures. 3. Abnormal signal in the proximal left iliac artery raises concern for occlusion or slow flow. 4. 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 5. Mild left subarticular and moderate left foraminal stenosis at L2-3. 6. Mild subarticular and moderate foraminal stenosis bilaterally at L3-4, right greater than left. 7. Moderate to severe central canal stenosis at L4-5 with right greater than left subarticular narrowing. 8. Moderate foraminal stenosis bilaterally at L4-5 is worse on the left. 9. Shallow right paramedian disc protrusion at L5-S1 potentially contacts the traversing right L5 nerve roots. 10. Moderate right foraminal stenosis at L5-S1. These results were called by telephone at  the time of interpretation on 12/07/2023 at 5:57 Pm to provider [facility], who verbally acknowledged these results. Electronically Signed   By: Audree Leas M.D.   On: 12/07/2023 18:17   CT Chest Wo Contrast Result Date: 12/07/2023 CLINICAL DATA:  Back pain, weakness, decreased appetite EXAM: CT CHEST WITHOUT CONTRAST TECHNIQUE: Multidetector CT  imaging of the chest was performed following the standard protocol without IV contrast. RADIATION DOSE REDUCTION: This exam was performed according to the departmental dose-optimization program which includes automated exposure control, adjustment of the mA and/or kV according to patient size and/or use of iterative reconstruction technique. COMPARISON:  12/07/2023, 08/28/2023 FINDINGS: Cardiovascular: Unenhanced imaging of the heart is unremarkable without pericardial effusion. 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 Dense atherosclerosis of the native coronary vasculature. Normal caliber of the thoracic aorta. Extensive atherosclerosis of the aorta and its branches. Assessment of the vascular lumen cannot be performed without IV contrast. Mediastinum/Nodes: No enlarged mediastinal or axillary lymph nodes. Thyroid  gland, trachea, and esophagus demonstrate no significant findings. Lungs/Pleura: Chronic elevation of the left hemidiaphragm, with mild left lower lobe bronchiectasis and scarring again noted. There are also linear opacities within the left upper lobe consistent with scarring seen previously. However, there is some new patchy nodular airspace disease within the bilateral upper lobes, left greater than right, concerning for superimposed pneumonia. No effusion or pneumothorax. Central airways are patent. Upper Abdomen: Cholelithiasis without evidence of acute cholecystitis. No other acute upper abdominal findings. Musculoskeletal: The mixed sclerotic and lucent lesion within the T9 vertebral body with associated right paraspinal soft tissue component is again identified, measuring 2.4 x 2.5 cm reference image 98/2. Since the previous exam, it appears that there is slight invagination of the superior endplate of the T9 vertebral body on the right, consistent with superimposed pathologic fracture. Less than 10% loss of height. No retropulsion. Further evaluation with MRI may be  useful given symptoms of back pain. There is a healing subacute to chronic right posterolateral seventh rib fracture with abundant callus formation, new since prior CT. No other acute bony abnormalities. Reconstructed images demonstrate no additional findings. IMPRESSION: 1. New fracture through the superior endplate of the T9 vertebral body at the site of the mixed sclerotic and lucent lesion seen on prior CT and MRI exams, consistent with pathologic fracture. Further evaluation with MRI may be useful. 2. Patchy bilateral upper lobe nodular airspace disease consistent with inflammatory or infectious etiology. 3. Chronic areas of scarring or atelectasis within the left upper and left lower lobes, with stable elevation of the left hemidiaphragm. 4.  Aortic Atherosclerosis (ICD10-I70.0). Electronically Signed   By: Bobbye Burrow M.D.   On: 12/07/2023 16:39   DG Chest Portable 1 View Result Date: 12/07/2023 CLINICAL DATA:  Cough, back pain EXAM: PORTABLE CHEST - 1 VIEW COMPARISON:  08/09/2023 FINDINGS: Low lung volumes. Chronic linear scarring or atelectasis laterally in the left mid lung. No new infiltrate or overt edema. Heart size and mediastinal contours are within normal limits. Aortic Atherosclerosis (ICD10-170.0). CABG markers. No effusion. Sternotomy wires. IMPRESSION: Low volumes with chronic left mid lung scarring or atelectasis. Electronically Signed   By: Nicoletta Barrier M.D.   On: 12/07/2023 13:29    Demaris Fillers, DO  [facility]  If 7PM-7AM, please contact night-coverage [email or web address] Password [email or web address] 12/29/2023, 6:04 PM   LOS: 1 day

## 2023-12-29 NOTE — Progress Notes (Signed)
 Mobility Specialist Progress Note:    12/29/23 1230  Mobility  Activity Ambulated with assistance in hallway  Level of Assistance Standby assist, set-up cues, supervision of patient - no hands on  Assistive Device None  Distance Ambulated (ft) 100 ft  Range of Motion/Exercises Active;All extremities  Activity Response Tolerated well  Mobility Referral Yes  Mobility visit 1 Mobility  Mobility Specialist Start Time (ACUTE ONLY) 1230  Mobility Specialist Stop Time (ACUTE ONLY) 1251  Mobility Specialist Time Calculation (min) (ACUTE ONLY) 21 min   Pt received in bed, family in room. Agreeable to mobility, required supervision to stand and ambulate with no AD. Tolerated well,SpO2 91% on RA at rest. SpO2 85-89% on RA during ambulation, denies SOB and lightheadedness. Returned pt supine, all needs met.  Janelie Goltz Mobility Specialist Please contact via Special educational needs teacher or  Rehab office at [phone]

## 2023-12-29 NOTE — Plan of Care (Signed)

## 2023-12-29 NOTE — Progress Notes (Signed)
 Mobility Specialist Progress Note:    12/29/23 0940  Mobility  Activity Ambulated with assistance in hallway  Level of Assistance Standby assist, set-up cues, supervision of patient - no hands on  Assistive Device None  Distance Ambulated (ft) 100 ft  Range of Motion/Exercises Active;All extremities  Activity Response Tolerated well  Mobility Referral Yes  Mobility visit 1 Mobility  Mobility Specialist Start Time (ACUTE ONLY) 0940  Mobility Specialist Stop Time (ACUTE ONLY) 1000  Mobility Specialist Time Calculation (min) (ACUTE ONLY) 20 min   Pt received in bed, agreeable to mobility. Required supervision to stand and ambulate with no AD. Tolerated well, SpO2 91% on RA at rest. SpO2 85-90% on RA during ambulation, left pt sitting EOB. All needs met.  Denina Rieger Mobility Specialist Please contact via Special educational needs teacher or  Rehab office at [phone]

## 2023-12-30 DIAGNOSIS — N179 Acute kidney failure, unspecified: Secondary | ICD-10-CM | POA: Diagnosis not present

## 2023-12-30 DIAGNOSIS — C7951 Secondary malignant neoplasm of bone: Secondary | ICD-10-CM | POA: Diagnosis not present

## 2023-12-30 DIAGNOSIS — J181 Lobar pneumonia, unspecified organism: Secondary | ICD-10-CM | POA: Diagnosis not present

## 2023-12-30 DIAGNOSIS — E1165 Type 2 diabetes mellitus with hyperglycemia: Secondary | ICD-10-CM | POA: Diagnosis not present

## 2023-12-30 LAB — BASIC METABOLIC PANEL WITH GFR
Anion gap: 8 (ref 5–15)
BUN: 38 mg/dL — ABNORMAL HIGH (ref 8–23)
CO2: 20 mmol/L — ABNORMAL LOW (ref 22–32)
Calcium: 8.4 mg/dL — ABNORMAL LOW (ref 8.9–10.3)
Chloride: 106 mmol/L (ref 98–111)
Creatinine, Ser: 2.55 mg/dL — ABNORMAL HIGH (ref 0.61–1.24)
GFR, Estimated: 25 mL/min — ABNORMAL LOW (ref >60)
Glucose, Bld: 101 mg/dL — ABNORMAL HIGH (ref 70–99)
Potassium: 4 mmol/L (ref 3.5–5.1)
Sodium: 134 mmol/L — ABNORMAL LOW (ref 135–145)

## 2023-12-30 LAB — CBC
HCT: 26.6 % — ABNORMAL LOW (ref 39.0–52.0)
Hemoglobin: 8.1 g/dL — ABNORMAL LOW (ref 13.0–17.0)
MCH: 24.5 pg — ABNORMAL LOW (ref 26.0–34.0)
MCHC: 30.5 g/dL (ref 30.0–36.0)
MCV: 80.6 fL (ref 80.0–100.0)
Platelets: 381 10*3/uL (ref 150–400)
RBC: 3.3 MIL/uL — ABNORMAL LOW (ref 4.22–5.81)
RDW: 16.2 % — ABNORMAL HIGH (ref 11.5–15.5)
WBC: 11.2 10*3/uL — ABNORMAL HIGH (ref 4.0–10.5)
nRBC: 0 % (ref 0.0–0.2)

## 2023-12-30 LAB — GLUCOSE, CAPILLARY
Glucose-Capillary: 114 mg/dL — ABNORMAL HIGH (ref 70–99)
Glucose-Capillary: 146 mg/dL — ABNORMAL HIGH (ref 70–99)
Glucose-Capillary: 255 mg/dL — ABNORMAL HIGH (ref 70–99)
Glucose-Capillary: 314 mg/dL — ABNORMAL HIGH (ref 70–99)

## 2023-12-30 LAB — MAGNESIUM: Magnesium: 2.1 mg/dL (ref 1.7–2.4)

## 2023-12-30 LAB — EXPECTORATED SPUTUM ASSESSMENT W GRAM STAIN, RFLX TO RESP C

## 2023-12-30 LAB — PHOSPHORUS: Phosphorus: 4 mg/dL (ref 2.5–4.6)

## 2023-12-30 MED ORDER — REVEFENACIN 175 MCG/3ML IN SOLN
175.0000 ug | Freq: Every day | RESPIRATORY_TRACT | Status: DC
  Administered 2023-12-31 – 2024-01-03 (×4): 175 ug via RESPIRATORY_TRACT
  Filled 2023-12-30 (×4): qty 3

## 2023-12-30 MED ORDER — ALBUTEROL SULFATE (2.5 MG/3ML) 0.083% IN NEBU
2.5000 mg | INHALATION_SOLUTION | Freq: Three times a day (TID) | RESPIRATORY_TRACT | Status: DC
  Administered 2023-12-31 – 2024-01-01 (×3): 2.5 mg via RESPIRATORY_TRACT
  Filled 2023-12-30 (×4): qty 3

## 2023-12-30 MED ORDER — METHYLPREDNISOLONE SODIUM SUCC 40 MG IJ SOLR
40.0000 mg | Freq: Two times a day (BID) | INTRAMUSCULAR | Status: DC
  Administered 2023-12-30 – 2024-01-03 (×9): 40 mg via INTRAVENOUS
  Filled 2023-12-30 (×9): qty 1

## 2023-12-30 MED ORDER — HYDROCOD POLI-CHLORPHE POLI ER 10-8 MG/5ML PO SUER
5.0000 mL | Freq: Two times a day (BID) | ORAL | Status: DC
  Administered 2023-12-30 – 2024-01-03 (×9): 5 mL via ORAL
  Filled 2023-12-30 (×9): qty 5

## 2023-12-30 NOTE — Plan of Care (Signed)

## 2023-12-30 NOTE — Progress Notes (Signed)
 PROGRESS NOTE  [name] JXB:[id number] DOB: 1946-01-31 DOA: 12/28/2023 PCP: [name]  Brief History:  78 year old male with a history of coronary artery disease status post CABG, hypertension, diabetes mellitus type 2, hyperlipidemia, CKD stage IV, 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 intake for the last several days.  He states that his coughing has worsened since 12/27/2023.  This has resulted in him having left-sided chest pain.  He states that the chest pain is elicited only when he coughs.  He denies any hemoptysis.  He does have a little worsening of his shortness of breath.  Notably, the patient was recently mated to the hospital from 12/10/2023 to 12/11/2023 secondary to syncope which was felt to be orthostatic in nature.  In addition, the patient had hospital admission from 12/07/2023 to 12/10/2023 secondary to pneumonia.  He was discharged home with levofloxacin . During his April admission for pneumonia, CT of the chest on 12/10/2023 was negative for PE but showed a pathologic fracture of T9 vertebra.  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  He has subsequently followed up with Dr. Cheree Cords.  He had a biopsy of his T9 vertebra on 12/26/2023.  Results are pending at this time.  Since his discharge from the hospital, the patient follow-up with his PCP.  The patient was started on Augmentin  and doxycycline  without improvement.  Multiple Myeloma Panel fron 12/07/23 showed elevated Alpha 2 globulin  at 1.3 and M-spike was not observed. Urine protein electrophoresis from 12/07/23 was normal.  His tumor markers including AFP, CA 19-9, and CEA were unremarkable.  His PSA was 3.89. His kappa and lambda free light chains were elevated, but kappa/lambda ratio was normal on  12/18/2023.  In the ED, the patient was afebrile hemodynamically stable with oxygen saturation 96% room air.  WBC 15.1, hemoglobin 9.9, platelets 475.  Sodium 133, potassium 4.1, bicarbonate 20, serum creatinine 3.32.  The patient was started on azithromycin  and ceftriaxone .   Assessment/Plan: Lobar pneumonia -12/28/2023 CT chest--new heterogenous opacities LUL and LLL; continued improvement of the opacities in the right upper lobe; Redemonstration of osseous metastases, essentially similar to the prior study, including pathological fracture of T9 vertebral body and right seventh rib. - continue cefepime  and azithromycin  another 24 hours - MRSA screen--neg - Check PCT <0.10 - Viral respiratory panel +rhino/enterovirus - Start Tussionex for cough - added brovana  and pulmicort   COPD exacerbation -Continue DuoNebs -Continue Brovana  and Pulmicort  -Start IV Solu-Medrol   Atypical chest pain - Secondary to left rib fracture - CT chest shows--minimally displaced fracture of the anteromedial left sixth rib, present since the prior study. No metastatic lesion seenat the fracture site. - Judicious opioids - started Tussionex for cough - troponin 5>>4   Acute on chronic renal failure--CKD stage IV - Baseline creatinine 2.2-2.5 - Presented with serum creatinine 3.32 - Secondary to volume depletion - continue IV fluids>>improving   Diabetes mellitus type 2 - NovoLog  sliding scale - start reduced dose semglee  - 12/11/23 A1C--7.6   Peripheral vascular disease - Continue statin and Pletal  - continue ASA/Plavix    Mixed hyperlipidemia - Continue statin   Essential hypertension - Continue metoprolol  - Holding amlodipine , losartan    Generalized weakness -Multifactorial including deconditioning, infectious process, COPD exacerbation, and possibly metastatic cancer - B12--472 - Folate 15.6 - TSH 2.658 - CPK  67 -PT eval             Family Communication:   spouse at bedside 5/18    Consultants:  none   Code Status:  FULL    DVT Prophylaxis:  [place] Heparin      Procedures: As Listed in Progress Note Above   Antibiotics: Cefepime  5/16>> Azithro 5/16>>       Subjective: Patient complains of largely nonproductive cough.  He states his breathing is little bit better.  He denies any nausea, vomiting or direct abdominal pain.  He has some left-sided rib pain.  Objective: Vitals:   12/30/23 0724 12/30/23 0813 12/30/23 1311 12/30/23 1325  BP:  (!) 127/56  (!) 147/63  Pulse:  (!) 106  (!) 103  Resp:    17  Temp:    97.8 F (36.6 C)  TempSrc:    Oral  SpO2: 93%  94%   Weight:      Height:        Intake/Output Summary (Last 24 hours) at 12/30/2023 1336 Last data filed at 12/30/2023 0915 Gross per 24 hour  Intake 480 ml  Output 300 ml  Net 180 ml   Weight change:  Exam:  General:  Pt is alert, follows commands appropriately, not in acute distress HEENT: No icterus, No thrush, No neck mass, [place]/AT Cardiovascular: RRR, S1/S2, no rubs, no gallops Respiratory: Bibasal rales.  Bibasilar wheeze. Abdomen: Soft/+BS, non tender, non distended, no guarding Extremities: No edema, No lymphangitis, No petechiae, No rashes, no synovitis   Data Reviewed: I have personally reviewed following labs and imaging studies Basic Metabolic Panel: Recent Labs  Lab 12/28/23 0706 12/29/23 0352 12/30/23 0347  NA 133* 135 134*  K 4.1 3.6 4.0  CL 99 104 106  CO2 20* 20* 20*  GLUCOSE 189* 138* 101*  BUN 44* 38* 38*  CREATININE 3.32* 2.85* 2.55*  CALCIUM  9.0 8.7* 8.4*  MG  --  2.2 2.1  PHOS  --   --  4.0   Liver Function Tests: No results for input(s): "AST", "ALT", "ALKPHOS", "BILITOT", "PROT", "ALBUMIN " in the last 168 hours. No results for input(s): "LIPASE", "AMYLASE" in the last 168 hours. No results for input(s): "AMMONIA" in the last 168 hours. Coagulation Profile: Recent Labs  Lab 12/26/23 0805  INR 1.1   CBC: Recent Labs  Lab 12/26/23 0805  12/28/23 0706 12/29/23 0352 12/30/23 0347  WBC 10.9* 15.1* 9.4 11.2*  HGB 10.7* 9.9* 9.2* 8.1*  HCT 32.8* 30.2* 30.1* 26.6*  MCV 78.3* 79.1* 81.1 80.6  PLT 540* 475* 458* 381   Cardiac Enzymes: Recent Labs  Lab 12/28/23 1100  CKTOTAL 67   BNP: Invalid input(s): "POCBNP" CBG: Recent Labs  Lab 12/29/23 1110 12/29/23 1638 12/29/23 2143 12/30/23 0746 12/30/23 1129  GLUCAP 147* 193* 165* 114* 146*   HbA1C: No results for input(s): "HGBA1C" in the last 72 hours. Urine analysis:    Component Value Date/Time   COLORURINE STRAW (A) 12/28/2023 0936   APPEARANCEUR CLEAR 12/28/2023 0936   LABSPEC 1.012 12/28/2023 0936   PHURINE 6.0 12/28/2023 0936   GLUCOSEU >=500 (A) 12/28/2023 0936   HGBUR NEGATIVE 12/28/2023 0936   BILIRUBINUR NEGATIVE 12/28/2023 0936   KETONESUR NEGATIVE 12/28/2023 0936   PROTEINUR NEGATIVE 12/28/2023 0936   UROBILINOGEN 0.2 03/16/2015 0120   NITRITE NEGATIVE 12/28/2023 0936   LEUKOCYTESUR NEGATIVE 12/28/2023 0936   Sepsis Labs: @LABRCNTIP (procalcitonin:4,lacticidven:4) ) Recent Results (from the past 240 hours)  Resp panel by RT-PCR (RSV, Flu A&B, Covid) Anterior  Nasal Swab     Status: None   Collection Time: 12/28/23  8:15 AM   Specimen: Anterior Nasal Swab  Result Value Ref Range Status   SARS Coronavirus 2 by RT PCR NEGATIVE NEGATIVE Final    Comment: (NOTE) SARS-CoV-2 target nucleic acids are NOT DETECTED.  The SARS-CoV-2 RNA is generally detectable in upper respiratory specimens during the acute phase of infection. The lowest concentration of SARS-CoV-2 viral copies this assay can detect is 138 copies/mL. A negative result does not preclude SARS-Cov-2 infection and should not be used as the sole basis for treatment or other patient management decisions. A negative result may occur with  improper specimen collection/handling, submission of specimen other than nasopharyngeal swab, presence of viral mutation(s) within the areas targeted by  this assay, and inadequate number of viral copies(<138 copies/mL). A negative result must be combined with clinical observations, patient history, and epidemiological information. The expected result is Negative.  Fact Sheet for Patients:  BloggerCourse.com  Fact Sheet for Healthcare Providers:  SeriousBroker.it  This test is no t yet approved or cleared by the [place]  FDA and  has been authorized for detection and/or diagnosis of SARS-CoV-2 by FDA under an Emergency Use Authorization (EUA). This EUA will remain  in effect (meaning this test can be used) for the duration of the COVID-19 declaration under Section 564(b)(1) of the Act, 21 U.S.C.section 360bbb-3(b)(1), unless the authorization is terminated  or revoked sooner.       Influenza A by PCR NEGATIVE NEGATIVE Final   Influenza B by PCR NEGATIVE NEGATIVE Final    Comment: (NOTE) The Xpert Xpress SARS-CoV-2/FLU/RSV plus assay is intended as an aid in the diagnosis of influenza from Nasopharyngeal swab specimens and should not be used as a sole basis for treatment. Nasal washings and aspirates are unacceptable for Xpert Xpress SARS-CoV-2/FLU/RSV testing.  Fact Sheet for Patients: BloggerCourse.com  Fact Sheet for Healthcare Providers: SeriousBroker.it  This test is not yet approved or cleared by the [place]  FDA and has been authorized for detection and/or diagnosis of SARS-CoV-2 by FDA under an Emergency Use Authorization (EUA). This EUA will remain in effect (meaning this test can be used) for the duration of the COVID-19 declaration under Section 564(b)(1) of the Act, 21 U.S.C. section 360bbb-3(b)(1), unless the authorization is terminated or revoked.     Resp Syncytial Virus by PCR NEGATIVE NEGATIVE Final    Comment: (NOTE) Fact Sheet for Patients: BloggerCourse.com  Fact Sheet  for Healthcare Providers: SeriousBroker.it  This test is not yet approved or cleared by the [place]  FDA and has been authorized for detection and/or diagnosis of SARS-CoV-2 by FDA under an Emergency Use Authorization (EUA). This EUA will remain in effect (meaning this test can be used) for the duration of the COVID-19 declaration under Section 564(b)(1) of the Act, 21 U.S.C. section 360bbb-3(b)(1), unless the authorization is terminated or revoked.  Performed at [facility], [address] [place], [place] [address]   MRSA Next Gen by PCR, Nasal     Status: None   Collection Time: 12/28/23 11:48 AM   Specimen: Nasal Mucosa; Nasal Swab  Result Value Ref Range Status   MRSA by PCR Next Gen NOT DETECTED NOT DETECTED Final    Comment: (NOTE) The GeneXpert MRSA Assay (FDA approved for NASAL specimens only), is one component of a comprehensive MRSA colonization surveillance program. It is not intended to diagnose MRSA infection nor to guide or monitor treatment for MRSA infections. Test performance is not  FDA approved in patients less than 67 years old. Performed at [facility], [address], [place], [place] [address]   Respiratory (~20 pathogens) panel by PCR     Status: Abnormal   Collection Time: 12/28/23 11:48 AM   Specimen: Nasopharyngeal Swab; Respiratory  Result Value Ref Range Status   Adenovirus NOT DETECTED NOT DETECTED Final   Coronavirus 229E NOT DETECTED NOT DETECTED Final    Comment: (NOTE) The Coronavirus on the Respiratory Panel, DOES NOT test for the novel  Coronavirus (2019 nCoV)    Coronavirus HKU1 NOT DETECTED NOT DETECTED Final   Coronavirus NL63 NOT DETECTED NOT DETECTED Final   Coronavirus OC43 NOT DETECTED NOT DETECTED Final   Metapneumovirus NOT DETECTED NOT DETECTED Final   Rhinovirus / Enterovirus DETECTED (A) NOT DETECTED Final   Influenza A NOT DETECTED NOT DETECTED Final   Influenza B NOT DETECTED NOT DETECTED Final    Parainfluenza Virus 1 NOT DETECTED NOT DETECTED Final   Parainfluenza Virus 2 NOT DETECTED NOT DETECTED Final   Parainfluenza Virus 3 NOT DETECTED NOT DETECTED Final   Parainfluenza Virus 4 NOT DETECTED NOT DETECTED Final   Respiratory Syncytial Virus NOT DETECTED NOT DETECTED Final   Bordetella pertussis NOT DETECTED NOT DETECTED Final   Bordetella Parapertussis NOT DETECTED NOT DETECTED Final   Chlamydophila pneumoniae NOT DETECTED NOT DETECTED Final   Mycoplasma pneumoniae NOT DETECTED NOT DETECTED Final    Comment: Performed at [facility], [address] [place], [place] [address]  Expectorated Sputum Assessment w Gram Stain, Rflx to Resp Cult     Status: None   Collection Time: 12/30/23  5:49 AM   Specimen: Sputum  Result Value Ref Range Status   Specimen Description SPUTUM  Final   Special Requests NONE  Final   Sputum evaluation   Final    THIS SPECIMEN IS ACCEPTABLE FOR SPUTUM CULTURE Performed at [facility], [address], [place], [place] [address]    Report Status 12/30/2023 FINAL  Final     Scheduled Meds:  acetaminophen   1,000 mg Oral Q8H   arformoterol   15 mcg Nebulization BID   aspirin  EC  81 mg Oral Q breakfast   atorvastatin   40 mg Oral QHS   azithromycin   500 mg Oral Daily   budesonide  (PULMICORT ) nebulizer solution  0.5 mg Nebulization BID   chlorpheniramine-HYDROcodone   5 mL Oral Q12H   cilostazol   100 mg Oral BID   clopidogrel   75 mg Oral q AM   cyanocobalamin   1,000 mcg Oral q AM   gabapentin   300 mg Oral BID   heparin   5,000 Units Subcutaneous Q8H   insulin  aspart  0-5 Units Subcutaneous QHS   insulin  aspart  0-9 Units Subcutaneous TID WC   insulin  glargine-yfgn  5 Units Subcutaneous QHS   ipratropium-albuterol   3 mL Nebulization TID   methylPREDNISolone (SOLU-MEDROL) injection  40 mg Intravenous Q12H   metoprolol  tartrate  25 mg Oral BID   Continuous Infusions:  0.9 % NaCl with KCl 20 mEq / L 75 mL/hr at 12/30/23 1149   ceFEPime   (MAXIPIME ) IV 2 g (12/30/23 0951)    Procedures/Studies: CT Chest Wo Contrast Result Date: 12/28/2023 CLINICAL DATA:  Chest pain, nonspecific ongoing chest pain, post pna. * Tracking Code: BO * EXAM: CT CHEST WITHOUT CONTRAST TECHNIQUE: Multidetector CT imaging of the chest was performed following the standard protocol without IV contrast. RADIATION DOSE REDUCTION: This exam was performed according to the departmental dose-optimization program which includes  automated exposure control, adjustment of the mA and/or kV according to patient size and/or use of iterative reconstruction technique. COMPARISON:  PET-CT scan from 12/27/2023 and CT angiography chest from 12/10/2023. FINDINGS: Cardiovascular: Normal cardiac size. No pericardial effusion. No aortic aneurysm. There are coronary artery calcifications, in keeping with coronary artery disease. There are also moderate peripheral atherosclerotic vascular calcifications of thoracic aorta and its major branches. 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 Mediastinum/Nodes: Visualized thyroid  gland appears grossly unremarkable. No solid / cystic mediastinal masses. The esophagus is nondistended precluding optimal assessment. There are few mildly prominent mediastinal lymph nodes, which do not meet the size criteria for lymphadenopathy and appear grossly similar to the prior study, favoring benign etiology. No axillary lymphadenopathy by size criteria. Evaluation of bilateral hila is limited due to lack on intravenous contrast: however, no large hilar lymphadenopathy identified. Lungs/Pleura: The central tracheo-bronchial tree is patent. There are patchy, heterogeneous, irregular opacities predominantly involving the left upper lobe, which are new since the prior study. There are also new heterogeneous scattered opacities in the left lung lower lobe. There is continued interval improvement in the opacities in the right upper lobe. Several areas of subsegmental  atelectasis throughout bilateral lungs are essentially unchanged in the interim. No new dense consolidation, lung collapse or pleural effusion. Evaluation for lung nodule is limited due to background lung parenchymal changes. Upper Abdomen: Small volume dependent calcified gallstones noted without imaging signs of acute cholecystitis. Remaining visualized upper abdominal viscera within normal limits. Musculoskeletal: The visualized soft tissues of the chest wall are grossly unremarkable. Redemonstration of mixed sclerotic/lytic lesions in the T7 and T9 vertebral body, essentially similar to the prior study. There is pathological fracture with mild compression deformity of T9 vertebral body. There is also ill-defined lytic lesion in the posterolateral right fourth rib (series 2, image 54), unchanged. Subacute/healing pathological fracture of right seventh rib also appears essentially unchanged. There is acute minimally displaced fracture of the anteromedial left sixth rib (series 2, image 79, which was present on the prior exam as well. There are mild multilevel degenerative changes in the visualized spine. IMPRESSION: 1. There are new heterogeneous opacities in the left lung, as described above, which are nonspecific and may represent multifocal pneumonia. There is continued interval improvement in the opacities in the right upper lobe. 2. Redemonstration of osseous metastases, essentially similar to the prior study, including pathological fracture of T9 vertebral body and right seventh rib. 3. There is minimally displaced fracture of the anteromedial left sixth rib, present since the prior study. No metastatic lesion seen at the fracture site. 4. Multiple other nonacute observations, as described above. Aortic Atherosclerosis (ICD10-I70.0). Electronically Signed   By: Beula Brunswick M.D.   On: 12/28/2023 08:42   DG Chest 2 View Result Date: 12/28/2023 CLINICAL DATA:  Shortness of breath. EXAM: CHEST - 2 VIEW  COMPARISON:  12/10/2023 FINDINGS: Low lung volumes. Stable asymmetric elevation left hemidiaphragm. Streaky density at the left base is similar to prior compatible with atelectasis or scarring. Posterior displaced fracture noted in a mid right rib, likely ribs 7. There is some bony callus at the fracture site suggesting nonacute injury. No pneumothorax or substantial pleural effusion. Telemetry leads overlie the chest. IMPRESSION: 1. Low lung volumes with streaky density at the left base compatible with atelectasis or scarring. 2. Nonacute posterior displaced fracture in a mid right rib, likely rib 7. Electronically Signed   By: Donnal Fusi M.D.   On: 12/28/2023 07:27   IR Fluoro Guide  Ndl Plmt / BX Result Date: 12/27/2023 INDICATION: Enhancing abnormality of the T9 vertebral body on recent MRI of the thoracic spine. EXAM: BONE BIOPSY AT T9 MEDICATIONS: None. ANESTHESIA/SEDATION: Moderate (conscious) sedation was employed during this procedure. A total of Versed  2 mg and Fentanyl  50 mcg was administered intravenously by the radiology nurse. Total intra-service moderate Sedation Time: 26 minutes. The patient's level of consciousness and vital signs were monitored continuously by radiology nursing throughout the procedure under my direct supervision. Fluoro time: 6 minutes 48 seconds.  272 mGy. COMPLICATIONS: None immediate. PROCEDURE: Informed written consent was obtained from the patient after a thorough discussion of the procedural risks, benefits and alternatives. All questions were addressed. Maximal Sterile Barrier Technique was utilized including caps, mask, sterile gowns, sterile gloves, sterile drape, hand hygiene and skin antiseptic. A timeout was performed prior to the initiation of the procedure. With the patient prone on the fluoroscopic table, the thoracolumbar region was prepped and draped in the usual manner. The T9 vertebral body was identified on the AP and lateral projection by counting from  the sacral region. The skin overlying the right pedicle was then infiltrated with 0.25% bupivacaine , and extended into the underlying paraspinal musculature. Using biplane intermittent fluoroscopy, a 13 gauge Cook spinal needle was advanced into the posterior aspect of T9. Through this, 2 passes were made with a 16 gauge core biopsy needle. Two core samples were obtained and sent in formalin for pathologic evaluation. Hemostasis was achieved at the skin entry site. Patient was then returned to short-stay for recovery. IMPRESSION: Status post fluoroscopic guided core biopsy at T9 vertebral body as described above without event. Electronically Signed   By: Luellen Sages M.D.   On: 12/27/2023 09:02   CT Angio Chest PE W and/or Wo Contrast Result Date: 12/10/2023 CLINICAL DATA:  High probability pulmonary embolism, pneumonia, syncope EXAM: CT ANGIOGRAPHY CHEST WITH CONTRAST TECHNIQUE: Multidetector CT imaging of the chest was performed using the standard protocol during bolus administration of intravenous contrast. Multiplanar CT image reconstructions and MIPs were obtained to evaluate the vascular anatomy. RADIATION DOSE REDUCTION: This exam was performed according to the departmental dose-optimization program which includes automated exposure control, adjustment of the mA and/or kV according to patient size and/or use of iterative reconstruction technique. CONTRAST:  60mL OMNIPAQUE  IOHEXOL  350 MG/ML SOLN COMPARISON:  12/07/2023 FINDINGS: Cardiovascular: Status post coronary artery bypass grafting. Global cardiac size within limits. No pericardial effusion. Central pulmonary arteries are of normal caliber. No intraluminal filling defect identified through the segmental level to suggest acute pulmonary embolism. Extensive atherosclerotic calcification within the thoracic aorta. No aortic aneurysm. Mediastinum/Nodes: No enlarged mediastinal, hilar, or axillary lymph nodes. Thyroid  gland, trachea, and esophagus  demonstrate no significant findings. Lungs/Pleura: Multifocal pulmonary infiltrates are again identified, slightly improved within the left upper lobe, but progressive within the right upper lobe in keeping with changes of multifocal pneumonia. Left lower lobe platelike atelectasis. No pneumothorax or pleural effusion. No central obstructing lesion. Upper Abdomen: Cholelithiasis without pericholecystic inflammatory change. No acute abnormality within visualized upper abdomen. 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 Musculoskeletal: Multiple lytic lesions are again identified within the T7 and T9 vertebral bodies, with pathologic fracture of the T9 vertebral body, and right 4 and 7 ribs, pathologic fracture right seventh rib. The findings are in keeping with with metastatic disease, less likely multiple myeloma. These are better assessed on MRI examination 12/07/2023. Review of the MIP images  confirms the above findings. IMPRESSION: 1. No acute pulmonary embolism. 2. Multifocal pulmonary infiltrates, slightly improved within the left upper lobe, but progressive within the right upper lobe in keeping with changes of multifocal pneumonia. 3. Multiple lytic lesions within the T7 and T9 vertebral bodies, with pathologic fracture of the T9 vertebral body, and right 4 and 7 ribs, and pathologic fracture of the right seventh rib. The findings are in keeping with metastatic disease, less likely multiple myeloma. These are better assessed on MRI examination 12/07/2023. 4. 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 Clinical correlation for signs and symptoms chronic mesenteric ischemia recommended. If indicated, this could be better assessed with dedicated CT arteriography. 5. Cholelithiasis. Electronically Signed   By: Worthy Heads M.D.   On: 12/10/2023 20:19   CT Head Wo Contrast Result Date: 12/10/2023 CLINICAL DATA:  Head trauma, minor (Age >= 65y).  Fall from chair. EXAM: CT HEAD WITHOUT CONTRAST TECHNIQUE: Contiguous axial images were obtained from the base of the skull through the vertex without intravenous contrast. RADIATION DOSE REDUCTION: This exam was performed according to the departmental dose-optimization program which includes automated exposure control, adjustment of the mA and/or kV according to patient size and/or use of iterative reconstruction technique. COMPARISON:  Head CT 09/13/2018 and MRI 03/29/2018 FINDINGS: Brain: There is no evidence of an acute infarct, intracranial hemorrhage, mass, midline shift, or extra-axial fluid collection. There is mild cerebral atrophy. A normal variant cavum septum pellucidum et vergae is noted. A lacunar infarct in the right corona radiata is new but chronic in appearance. Mild hypodensities elsewhere in the cerebral white matter bilaterally are nonspecific but compatible with chronic small vessel ischemia. Vascular: Calcified atherosclerosis at the skull base. No hyperdense vessel. Skull: No acute fracture or suspicious lesion. Sinuses/Orbits: Visualized paranasal sinuses are clear. Moderately large right mastoid effusion. Unremarkable orbits. Other: None. IMPRESSION: 1. No evidence of acute intracranial abnormality. 2. Mild chronic small vessel ischemia. Electronically Signed   By: Aundra Lee M.D.   On: 12/10/2023 20:10   DG Chest Portable 1 View Result Date: 12/10/2023 CLINICAL DATA:  Recent pneumonia.  Syncope. EXAM: PORTABLE CHEST 1 VIEW COMPARISON:  Chest radiograph dated 12/07/2023. FINDINGS: Shallow inspiration. Faint bilateral mid to lower lung field interstitial densities may represent atelectasis or infiltrate. No consolidative changes. No large pleural effusion or pneumothorax. Stable cardiac silhouette. Median sternotomy wires and CABG vascular clips. No acute  osseous pathology. IMPRESSION: Shallow inspiration with bilateral mid to lower lung field atelectasis or infiltrate. Electronically Signed   By: Angus Bark M.D.   On: 12/10/2023 17:53   MR THORACIC SPINE W WO CONTRAST Result Date: 12/07/2023 CLINICAL DATA:  Thoracic spine compression fracture. Back pain. Decreased appetite. EXAM: MRI THORACIC WITHOUT AND WITH CONTRAST TECHNIQUE: Multiplanar and multiecho pulse sequences of the thoracic spine were obtained without and with intravenous contrast. CONTRAST:  6mL GADAVIST  GADOBUTROL  1 MMOL/ML IV SOLN COMPARISON:  CT chest without contrast 12/07/2023. MRI of the thoracic spine without and with contrast 02/22/2021 FINDINGS: Alignment: No significant listhesis is present. Leftward curvature of the thoracic spine is centered at T8-9. Vertebrae: Pathologic compression fracture is confirmed at T9-10. Abnormal enhancement extending extends into an expanded right pedicle. Prominent lesion across the inferior right lateral aspect of the T7 vertebral body measures 2.2 x 1.4 cm. Extensive enhancement encompasses the majority of the T9 vertebral body with some sparing on the left. 11 mm rounded enhancing lesion is  present in the left T4. Multiple lesions are present at T2, the largest anteriorly on the left along the inferior endplate measuring 11 mm. No other pathologic fractures are present. No significant extraosseous tumor is present. Multiple rib lesions are present bilaterally without a pathologic rib fracture. Cord: Central T2 hyperintensities noted in the cord at the T2-3 level. No other cord signal changes present. Cord morphology is within normal limits. Paraspinal and other soft tissues: The paraspinous soft tissues are within normal limits. The visualized lung fields are clear. No primary lesion is identified. Disc levels: No significant central disc protrusion or central canal stenosis is present. Facet hypertrophy contributes 2 right greater than left  foraminal stenosis from T8-9 through T11-12. Moderate right foraminal stenosis is also present at T7-8. IMPRESSION: 1. Pathologic compression fracture at T9-10 with abnormal enhancement extending into an expanded right pedicle. 2. Multiple enhancing lesions throughout the thoracic spine as described compatible with metastatic disease or multiple myeloma. 3. Multiple rib lesions bilaterally without a pathologic rib fracture. 4. Central T2 hyperintensity in the cord at the T2-3 level is nonspecific but may represent a small focus of myelomalacia. 5. No significant central canal stenosis. 6. Facet hypertrophy contributes to right greater than left foraminal stenosis from T8-9 through T11-12. Moderate right foraminal stenosis is also present at T7-8. These results were called by telephone at the time of interpretation on 12/07/2023 at 6:31 pm to provider JULIE HAVILAND , who verbally acknowledged these results. Electronically Signed   By: Audree Leas M.D.   On: 12/07/2023 18:31   MR Lumbar Spine W Wo Contrast Result Date: 12/07/2023 CLINICAL DATA:  Back pain and chills. EXAM: MRI LUMBAR SPINE WITHOUT AND WITH CONTRAST TECHNIQUE: Multiplanar and multiecho pulse sequences of the lumbar spine were obtained without and with intravenous contrast. CONTRAST:  6mL GADAVIST  GADOBUTROL  1 MMOL/ML IV SOLN COMPARISON:  CT of the abdomen and pelvis without contrast 04/13/2023 FINDINGS: Segmentation: 5 non rib-bearing lumbar type vertebral bodies are present. The lowest fully formed vertebral body is L5. Alignment: No significant listhesis is present. Rightward curvature of the lumbar spine is centered at L3. Vertebrae: Multiple enhancing lesions are present throughout the lumbar spine. Lesion anteriorly on the left subjacent to the superior endplate of L3 measures 3.1 x 2.6 x 1.7 cm. And a lesion inferiorly in anteriorly on the right measures 1.8 x 1.5 x 1.8 cm. No pathologic fractures are present. Multiple other smaller  lesions are present throughout the lumbar spine, visualized sacrum and iliac bones. Conus medullaris and cauda equina: Conus extends to the L1 level. Conus and cauda equina appear normal. Paraspinal and other soft tissues: Simple cysts are present in the pole of both kidneys. The largest is on the right measuring 3 cm. Atherosclerotic changes are present in the aorta and branch vessels. Abnormal signal in the proximal left iliac artery raises concern for occlusion or slow flow. No significant adenopathy is present. No primary lesion is identified. Disc levels: L1-2: Mild disc bulging and facet hypertrophy is present. No significant stenosis is present. L2-3: A broad-based disc protrusion is asymmetric to the left. Mild facet hypertrophy is noted bilaterally. Mild left subarticular and moderate left foraminal stenosis is present. L3-4: A broad-based disc protrusion is present. Moderate facet hypertrophy is worse on the right. This results in mild subarticular and moderate foraminal stenosis bilaterally, right greater than left. L4-5: A broad-based disc protrusion is present. Moderate facet hypertrophy is worse on the right. Moderate to severe central canal stenosis is present  with right greater than left subarticular narrowing. Moderate foraminal stenosis is worse on the left. L5-S1: A shallow right paramedian disc protrusion is present. This potentially contacts the traversing right L5 nerve roots. Facet spurring contributes to moderate right foraminal stenosis. IMPRESSION: 1. Multiple enhancing lesions throughout the lumbar spine, visualized sacrum and iliac bones compatible with metastatic disease or multiple myeloma. 2. No pathologic fractures. 3. Abnormal signal in the proximal left iliac artery raises concern for occlusion or slow flow. 4. 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 5. Mild left subarticular and moderate left foraminal stenosis at L2-3. 6. Mild subarticular and moderate foraminal  stenosis bilaterally at L3-4, right greater than left. 7. Moderate to severe central canal stenosis at L4-5 with right greater than left subarticular narrowing. 8. Moderate foraminal stenosis bilaterally at L4-5 is worse on the left. 9. Shallow right paramedian disc protrusion at L5-S1 potentially contacts the traversing right L5 nerve roots. 10. Moderate right foraminal stenosis at L5-S1. These results were called by telephone at the time of interpretation on 12/07/2023 at 5:57 Pm to provider [facility], who verbally acknowledged these results. Electronically Signed   By: Audree Leas M.D.   On: 12/07/2023 18:17   CT Chest Wo Contrast Result Date: 12/07/2023 CLINICAL DATA:  Back pain, weakness, decreased appetite EXAM: CT CHEST WITHOUT CONTRAST TECHNIQUE: Multidetector CT imaging of the chest was performed following the standard protocol without IV contrast. RADIATION DOSE REDUCTION: This exam was performed according to the departmental dose-optimization program which includes automated exposure control, adjustment of the mA and/or kV according to patient size and/or use of iterative reconstruction technique. COMPARISON:  12/07/2023, 08/28/2023 FINDINGS: Cardiovascular: Unenhanced imaging of the heart is unremarkable without pericardial effusion. 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 Dense atherosclerosis of the native coronary vasculature. Normal caliber of the thoracic aorta. Extensive atherosclerosis of the aorta and its branches. Assessment of the vascular lumen cannot be performed without IV contrast. Mediastinum/Nodes: No enlarged mediastinal or axillary lymph nodes. Thyroid  gland, trachea, and esophagus demonstrate no significant findings. Lungs/Pleura: Chronic elevation of the left hemidiaphragm, with mild left lower lobe bronchiectasis and scarring again noted. There are also linear opacities within the left upper lobe consistent with scarring seen previously. However,  there is some new patchy nodular airspace disease within the bilateral upper lobes, left greater than right, concerning for superimposed pneumonia. No effusion or pneumothorax. Central airways are patent. Upper Abdomen: Cholelithiasis without evidence of acute cholecystitis. No other acute upper abdominal findings. Musculoskeletal: The mixed sclerotic and lucent lesion within the T9 vertebral body with associated right paraspinal soft tissue component is again identified, measuring 2.4 x 2.5 cm reference image 98/2. Since the previous exam, it appears that there is slight invagination of the superior endplate of the T9 vertebral body on the right, consistent with superimposed pathologic fracture. Less than 10% loss of height. No retropulsion. Further evaluation with MRI may be useful given symptoms of back pain. There is a healing subacute to chronic right posterolateral seventh rib fracture with abundant callus formation, new since prior CT. No other acute bony abnormalities. Reconstructed images demonstrate no additional findings. IMPRESSION: 1. New fracture through the superior endplate of the T9 vertebral body at the site of the mixed sclerotic and lucent lesion seen on prior CT and MRI exams, consistent with pathologic fracture. Further evaluation with MRI may be useful. 2. Patchy bilateral upper lobe nodular airspace disease consistent with inflammatory or infectious etiology. 3. Chronic areas of scarring or atelectasis within  the left upper and left lower lobes, with stable elevation of the left hemidiaphragm. 4.  Aortic Atherosclerosis (ICD10-I70.0). Electronically Signed   By: Bobbye Burrow M.D.   On: 12/07/2023 16:39   DG Chest Portable 1 View Result Date: 12/07/2023 CLINICAL DATA:  Cough, back pain EXAM: PORTABLE CHEST - 1 VIEW COMPARISON:  08/09/2023 FINDINGS: Low lung volumes. Chronic linear scarring or atelectasis laterally in the left mid lung. No new infiltrate or overt edema. Heart size and  mediastinal contours are within normal limits. Aortic Atherosclerosis (ICD10-170.0). CABG markers. No effusion. Sternotomy wires. IMPRESSION: Low volumes with chronic left mid lung scarring or atelectasis. Electronically Signed   By: Nicoletta Barrier M.D.   On: 12/07/2023 13:29    Demaris Fillers, DO  [facility]  If 7PM-7AM, please contact night-coverage [email or web address] Password [email or web address] 12/30/2023, 1:36 PM   LOS: 2 days

## 2023-12-30 NOTE — Evaluation (Signed)
 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 intake for the last several days.  He states that his coughing has worsened since 12/27/2023.  This has resulted in him having left-sided chest pain.  He states that the chest pain is elicited only when he coughs.  He denies any hemoptysis.  He does have a little worsening of his shortness of breath.  Notably, the patient was recently mated to the hospital from 12/10/2023 to 12/11/2023 secondary to syncope which was felt to be orthostatic in nature.  In addition, the patient had hospital admission from 12/07/2023 to 12/10/2023 secondary to pneumonia.  He was discharged home with levofloxacin .  During his April admission for pneumonia, CT of the chest on 12/10/2023 was negative for PE but showed a pathologic fracture of T9 vertebra.  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  He has subsequently followed up with Dr. Cheree Cords.  He had a biopsy of his T9 vertebra on 12/26/2023.  Results are pending at this time.   Clinical Impression  Patient demonstrates independence with bed mobility, functional transfers, and ambulation. Patient does demonstrate decreased ambulation speed during today's session but shows no signs of imbalance or SOB with extended distance. Patient educated on fall risk and the importance of using AD as needed. Patient just received respiratory treatment right before ambulation, O2 stable at 94% and no SOB during or after ambulation as pt was able to  maintain conversation. Patient discharged from physical therapy to care of nursing for ambulation daily as tolerated for length of stay.          If plan is discharge home, recommend the following: Help with stairs or ramp for entrance   Can travel by private vehicle        Equipment Recommendations None recommended by PT  Recommendations for Other Services       Functional Status Assessment Patient has had a recent decline in their functional status and demonstrates the ability to make significant improvements in function in a reasonable and predictable amount of time.     Precautions / Restrictions Precautions Precautions: Fall Recall of Precautions/Restrictions: Intact Restrictions Weight Bearing Restrictions Per Provider Order: No      Mobility  Bed Mobility Overal bed mobility: Independent                  Transfers Overall transfer level: Independent   Transfers: Sit to/from Stand, Bed to chair/wheelchair/BSC Sit to Stand: Independent           General transfer comment: pt uses BUE to stand    Ambulation/Gait Ambulation/Gait assistance: Modified independent (Device/Increase time) Gait Distance (Feet): 100 Feet Assistive device: None Gait Pattern/deviations: WFL(Within Functional Limits), Decreased stride length Gait velocity: decreased     General Gait Details: Pt demonstrates ability to walk with no AD at this time  Stairs            Wheelchair Mobility     Tilt Bed    Modified Rankin (Stroke Patients Only)       Balance Overall balance assessment: Independent  Pertinent Vitals/Pain Pain Assessment Pain Assessment: No/denies pain    Home Living Family/patient expects to be discharged to:: Private residence Living Arrangements: Spouse/significant other Available Help at Discharge: Family;Available 24 hours/day Type of Home: House Home Access: Stairs to  enter Entrance Stairs-Rails: Right;Left Entrance Stairs-Number of Steps: 5 in front; 3 in back   Home Layout: One level Home Equipment: Agricultural consultant (2 wheels);Shower seat;Cane - single point;Grab bars - toilet;Grab bars - tub/shower      Prior Function Prior Level of Function : Independent/Modified Independent             Mobility Comments: independent without AD ADLs Comments: independent     Extremity/Trunk Assessment   Upper Extremity Assessment Upper Extremity Assessment: Overall WFL for tasks assessed    Lower Extremity Assessment Lower Extremity Assessment: Overall WFL for tasks assessed    Cervical / Trunk Assessment Cervical / Trunk Assessment: Kyphotic  Communication   Communication Communication: No apparent difficulties    Cognition Arousal: Alert Behavior During Therapy: WFL for tasks assessed/performed   PT - Cognitive impairments: No apparent impairments                         Following commands: Intact       Cueing Cueing Techniques: Verbal cues, Tactile cues     General Comments      Exercises     Assessment/Plan    PT Assessment Patient does not need any further PT services  PT Problem List Decreased mobility       PT Treatment Interventions      PT Goals (Current goals can be found in the Care Plan section)  Acute Rehab PT Goals Patient Stated Goal: return home PT Goal Formulation: With patient Time For Goal Achievement: 12/30/23 Potential to Achieve Goals: Good    Frequency       Co-evaluation               AM-PAC PT "6 Clicks" Mobility  Outcome Measure Help needed turning from your back to your side while in a flat bed without using bedrails?: None Help needed moving from lying on your back to sitting on the side of a flat bed without using bedrails?: None Help needed moving to and from a bed to a chair (including a wheelchair)?: None Help needed standing up from a chair using your arms (e.g.,  wheelchair or bedside chair)?: None Help needed to walk in hospital room?: None Help needed climbing 3-5 steps with a railing? : A Little 6 Click Score: 23    End of Session Equipment Utilized During Treatment: Gait belt Activity Tolerance: Patient tolerated treatment well Patient left: in chair;with call bell/phone within reach Nurse Communication: Mobility status PT Visit Diagnosis: History of falling (Z91.81)    Time: 9147-8295 PT Time Calculation (min) (ACUTE ONLY): 20 min   Charges:   PT Evaluation $PT Eval Low Complexity: 1 Low PT Treatments $Therapeutic Activity: 8-22 mins PT General Charges $$ ACUTE PT VISIT: 1 Visit         Armond Bertin, PT, DPT [facility] Office: [phone] 8:58 AM, 12/30/23

## 2023-12-30 NOTE — Plan of Care (Signed)

## 2023-12-31 DIAGNOSIS — C799 Secondary malignant neoplasm of unspecified site: Secondary | ICD-10-CM | POA: Diagnosis not present

## 2023-12-31 DIAGNOSIS — E1165 Type 2 diabetes mellitus with hyperglycemia: Secondary | ICD-10-CM | POA: Diagnosis not present

## 2023-12-31 DIAGNOSIS — K920 Hematemesis: Secondary | ICD-10-CM | POA: Diagnosis not present

## 2023-12-31 DIAGNOSIS — N179 Acute kidney failure, unspecified: Secondary | ICD-10-CM | POA: Diagnosis not present

## 2023-12-31 DIAGNOSIS — J181 Lobar pneumonia, unspecified organism: Secondary | ICD-10-CM | POA: Diagnosis not present

## 2023-12-31 DIAGNOSIS — D649 Anemia, unspecified: Secondary | ICD-10-CM | POA: Diagnosis not present

## 2023-12-31 LAB — MAGNESIUM: Magnesium: 2.3 mg/dL (ref 1.7–2.4)

## 2023-12-31 LAB — BASIC METABOLIC PANEL WITH GFR
Anion gap: 7 (ref 5–15)
BUN: 57 mg/dL — ABNORMAL HIGH (ref 8–23)
CO2: 22 mmol/L (ref 22–32)
Calcium: 9.1 mg/dL (ref 8.9–10.3)
Chloride: 105 mmol/L (ref 98–111)
Creatinine, Ser: 2.34 mg/dL — ABNORMAL HIGH (ref 0.61–1.24)
GFR, Estimated: 28 mL/min — ABNORMAL LOW (ref >60)
Glucose, Bld: 239 mg/dL — ABNORMAL HIGH (ref 70–99)
Potassium: 4.5 mmol/L (ref 3.5–5.1)
Sodium: 134 mmol/L — ABNORMAL LOW (ref 135–145)

## 2023-12-31 LAB — CBC
HCT: 26 % — ABNORMAL LOW (ref 39.0–52.0)
Hemoglobin: 8.2 g/dL — ABNORMAL LOW (ref 13.0–17.0)
MCH: 25.6 pg — ABNORMAL LOW (ref 26.0–34.0)
MCHC: 31.5 g/dL (ref 30.0–36.0)
MCV: 81.3 fL (ref 80.0–100.0)
Platelets: 580 10*3/uL — ABNORMAL HIGH (ref 150–400)
RBC: 3.2 MIL/uL — ABNORMAL LOW (ref 4.22–5.81)
RDW: 16.3 % — ABNORMAL HIGH (ref 11.5–15.5)
WBC: 15.2 10*3/uL — ABNORMAL HIGH (ref 4.0–10.5)
nRBC: 0 % (ref 0.0–0.2)

## 2023-12-31 LAB — GLUCOSE, CAPILLARY
Glucose-Capillary: 278 mg/dL — ABNORMAL HIGH (ref 70–99)
Glucose-Capillary: 314 mg/dL — ABNORMAL HIGH (ref 70–99)
Glucose-Capillary: 119 mg/dL — ABNORMAL HIGH (ref 70–99)
Glucose-Capillary: 275 mg/dL — ABNORMAL HIGH (ref 70–99)

## 2023-12-31 LAB — PREPARE RBC (CROSSMATCH)

## 2023-12-31 LAB — AMMONIA: Ammonia: 13 umol/L (ref 9–35)

## 2023-12-31 LAB — PHOSPHORUS: Phosphorus: 3 mg/dL (ref 2.5–4.6)

## 2023-12-31 LAB — SURGICAL PATHOLOGY

## 2023-12-31 LAB — HEMOGLOBIN AND HEMATOCRIT, BLOOD
HCT: 21 % — ABNORMAL LOW (ref 39.0–52.0)
Hemoglobin: 6.8 g/dL — CL (ref 13.0–17.0)

## 2023-12-31 MED ORDER — INSULIN GLARGINE-YFGN 100 UNIT/ML ~~LOC~~ SOLN
8.0000 [IU] | Freq: Every day | SUBCUTANEOUS | Status: DC
  Administered 2023-12-31 – 2024-01-02 (×3): 8 [IU] via SUBCUTANEOUS
  Filled 2023-12-31 (×4): qty 0.08

## 2023-12-31 MED ORDER — PANTOPRAZOLE SODIUM 40 MG IV SOLR
40.0000 mg | Freq: Two times a day (BID) | INTRAVENOUS | Status: DC
  Administered 2023-12-31 – 2024-01-03 (×7): 40 mg via INTRAVENOUS
  Filled 2023-12-31 (×7): qty 10

## 2023-12-31 MED ORDER — INSULIN ASPART 100 UNIT/ML IJ SOLN
3.0000 [IU] | Freq: Three times a day (TID) | INTRAMUSCULAR | Status: DC
  Administered 2023-12-31 – 2024-01-03 (×5): 3 [IU] via SUBCUTANEOUS

## 2023-12-31 MED ORDER — METOPROLOL TARTRATE 5 MG/5ML IV SOLN
5.0000 mg | Freq: Four times a day (QID) | INTRAVENOUS | Status: DC
  Administered 2023-12-31 – 2024-01-03 (×13): 5 mg via INTRAVENOUS
  Filled 2023-12-31 (×14): qty 5

## 2023-12-31 MED ORDER — SODIUM CHLORIDE 0.9% IV SOLUTION: Freq: Once | INTRAVENOUS | Status: DC

## 2023-12-31 NOTE — Progress Notes (Signed)
 PROGRESS NOTE  MASSON NALEPA ZOX:[id number] DOB: 12/23/1945 DOA: 12/28/2023 PCP: [name]  Brief History:  78 year old male with a history of coronary artery disease status post CABG, hypertension, diabetes mellitus type 2, hyperlipidemia, CKD stage IV, 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 intake for the last several days.  He states that his coughing has worsened since 12/27/2023.  This has resulted in him having left-sided chest pain.  He states that the chest pain is elicited only when he coughs.  He denies any hemoptysis.  He does have a little worsening of his shortness of breath.  Notably, the patient was recently mated to the hospital from 12/10/2023 to 12/11/2023 secondary to syncope which was felt to be orthostatic in nature.  In addition, the patient had hospital admission from 12/07/2023 to 12/10/2023 secondary to pneumonia.  He was discharged home with levofloxacin . During his April admission for pneumonia, CT of the chest on 12/10/2023 was negative for PE but showed a pathologic fracture of T9 vertebra.  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  He has subsequently followed up with Dr. Cheree Cords.  He had a biopsy of his T9 vertebra on 12/26/2023.  Results are pending at this time.  Since his discharge from the hospital, the patient follow-up with his PCP.  The patient was started on Augmentin  and doxycycline  without improvement.  Multiple Myeloma Panel fron 12/07/23 showed elevated Alpha 2 globulin  at 1.3 and M-spike was not observed. Urine protein electrophoresis from 12/07/23 was normal.  His tumor markers including AFP, CA 19-9, and CEA were unremarkable.  His PSA was 3.89. His kappa and lambda free light chains were elevated, but kappa/lambda ratio was normal on  12/18/2023.  In the ED, the patient was afebrile hemodynamically stable with oxygen saturation 96% room air.  WBC 15.1, hemoglobin 9.9, platelets 475.  Sodium 133, potassium 4.1, bicarbonate 20, serum creatinine 3.32.  The patient was started on azithromycin  and ceftriaxone .   Assessment/Plan:  Lobar pneumonia -12/28/2023 CT chest--new heterogenous opacities LUL and LLL; continued improvement of the opacities in the right upper lobe; Redemonstration of osseous metastases, essentially similar to the prior study, including pathological fracture of T9 vertebral body and right seventh rib. - continue cefepime  and azithromycin  another 24 hours - MRSA screen--neg - Check PCT <0.10 - Viral respiratory panel +rhino/enterovirus - Started Tussionex for cough - added brovana  and pulmicort    COPD exacerbation -Continue DuoNebs -Continue Brovana  and Pulmicort  -Continue Solu-Medrol  -add Yupelri   Coffee Grounds Emesis -5/19 AM--had 2 episodes -GI consulted -downgrade to clear liquids -stopping plavix , ASA temporarily -switch essential meds to IV for now   Atypical chest pain - Secondary to left rib fracture - CT chest shows--minimally displaced fracture of the anteromedial left sixth rib, present since the prior study. No metastatic lesion seenat the fracture site. - Judicious opioids - started Tussionex for cough - troponin 5>>4   Acute on chronic renal failure--CKD stage IV - Baseline creatinine 2.2-2.5 - Presented with serum creatinine 3.32 - Secondary to volume depletion - continue IV fluids>>improving   Diabetes mellitus type 2 - NovoLog  sliding scale - start reduced dose semglee  - 12/11/23 A1C--7.6   Peripheral vascular disease - Continue statin and Pletal  - holding ASA/Plavix  temporarily   Mixed hyperlipidemia - Continue statin   Essential hypertension - Continue metoprolol >>change  to IV temporarily - Holding amlodipine , losartan    Generalized weakness -Multifactorial  including deconditioning, infectious process, COPD exacerbation, and possibly metastatic cancer - B12--472 - Folate 15.6 - TSH 2.658 - CPK 67 -PT eval             Family Communication:   spouse at bedside 5/19   Consultants:  none   Code Status:  FULL    DVT Prophylaxis:  SCDs     Procedures: As Listed in Progress Note Above   Antibiotics: Cefepime  5/16>> Azithro 5/16>>       Subjective: Pt had coffee grounds emesis x2.  Denies cp, sob, diarrhea, hematochezia, melena, abd pain  Objective: Vitals:   12/31/23 0720 12/31/23 0724 12/31/23 0727 12/31/23 1000  BP:    (!) 157/62  Pulse:    (!) 105  Resp:    20  Temp:    97.9 F (36.6 C)  TempSrc:    Oral  SpO2: 98% 98% 98%   Weight:      Height:        Intake/Output Summary (Last 24 hours) at 12/31/2023 1142 Last data filed at 12/30/2023 1500 Gross per 24 hour  Intake 1230.44 ml  Output --  Net 1230.44 ml   Weight change:  Exam:  General:  Pt is alert, follows commands appropriately, not in acute distress HEENT: No icterus, No thrush, No neck mass, [place]/AT Cardiovascular: RRR, S1/S2, no rubs, no gallops Respiratory: bibasilar rales.  Bibasilar wheeze Abdomen: Soft/+BS, non tender, non distended, no guarding Extremities: No edema, No lymphangitis, No petechiae, No rashes, no synovitis   Data Reviewed: I have personally reviewed following labs and imaging studies Basic Metabolic Panel: Recent Labs  Lab 12/28/23 0706 12/29/23 0352 12/30/23 0347 12/31/23 0413  NA 133* 135 134* 134*  K 4.1 3.6 4.0 4.5  CL 99 104 106 105  CO2 20* 20* 20* 22  GLUCOSE 189* 138* 101* 239*  BUN 44* 38* 38* 57*  CREATININE 3.32* 2.85* 2.55* 2.34*  CALCIUM  9.0 8.7* 8.4* 9.1  MG  --  2.2 2.1 2.3  PHOS  --   --  4.0 3.0   Liver Function Tests: No results for input(s): "AST", "ALT", "ALKPHOS", "BILITOT", "PROT", "ALBUMIN " in the last 168 hours. No results for input(s): "LIPASE", "AMYLASE" in the last 168 hours. Recent  Labs  Lab 12/31/23 0811  AMMONIA 13   Coagulation Profile: Recent Labs  Lab 12/26/23 0805  INR 1.1   CBC: Recent Labs  Lab 12/26/23 0805 12/28/23 0706 12/29/23 0352 12/30/23 0347 12/31/23 0811  WBC 10.9* 15.1* 9.4 11.2* 15.2*  HGB 10.7* 9.9* 9.2* 8.1* 8.2*  HCT 32.8* 30.2* 30.1* 26.6* 26.0*  MCV 78.3* 79.1* 81.1 80.6 81.3  PLT 540* 475* 458* 381 580*   Cardiac Enzymes: Recent Labs  Lab 12/28/23 1100  CKTOTAL 67   BNP: Invalid input(s): "POCBNP" CBG: Recent Labs  Lab 12/30/23 0746 12/30/23 1129 12/30/23 1634 12/30/23 2211 12/31/23 0725  GLUCAP 114* 146* 255* 314* 278*   HbA1C: No results for input(s): "HGBA1C" in the last 72 hours. Urine analysis:    Component Value Date/Time   COLORURINE STRAW (A) 12/28/2023 0936   APPEARANCEUR CLEAR 12/28/2023 0936   LABSPEC 1.012 12/28/2023 0936   PHURINE 6.0 12/28/2023 0936   GLUCOSEU >=500 (A) 12/28/2023 0936   HGBUR NEGATIVE 12/28/2023 0936   BILIRUBINUR NEGATIVE 12/28/2023 0936   KETONESUR NEGATIVE 12/28/2023 0936   PROTEINUR NEGATIVE 12/28/2023 0936   UROBILINOGEN 0.2 03/16/2015 0120   NITRITE NEGATIVE  12/28/2023 0936   LEUKOCYTESUR NEGATIVE 12/28/2023 0936   Sepsis Labs: @LABRCNTIP (procalcitonin:4,lacticidven:4) ) Recent Results (from the past 240 hours)  Resp panel by RT-PCR (RSV, Flu A&B, Covid) Anterior Nasal Swab     Status: None   Collection Time: 12/28/23  8:15 AM   Specimen: Anterior Nasal Swab  Result Value Ref Range Status   SARS Coronavirus 2 by RT PCR NEGATIVE NEGATIVE Final    Comment: (NOTE) SARS-CoV-2 target nucleic acids are NOT DETECTED.  The SARS-CoV-2 RNA is generally detectable in upper respiratory specimens during the acute phase of infection. The lowest concentration of SARS-CoV-2 viral copies this assay can detect is 138 copies/mL. A negative result does not preclude SARS-Cov-2 infection and should not be used as the sole basis for treatment or other patient management  decisions. A negative result may occur with  improper specimen collection/handling, submission of specimen other than nasopharyngeal swab, presence of viral mutation(s) within the areas targeted by this assay, and inadequate number of viral copies(<138 copies/mL). A negative result must be combined with clinical observations, patient history, and epidemiological information. The expected result is Negative.  Fact Sheet for Patients:  BloggerCourse.com  Fact Sheet for Healthcare Providers:  SeriousBroker.it  This test is no t yet approved or cleared by the [place]  FDA and  has been authorized for detection and/or diagnosis of SARS-CoV-2 by FDA under an Emergency Use Authorization (EUA). This EUA will remain  in effect (meaning this test can be used) for the duration of the COVID-19 declaration under Section 564(b)(1) of the Act, 21 U.S.C.section 360bbb-3(b)(1), unless the authorization is terminated  or revoked sooner.       Influenza A by PCR NEGATIVE NEGATIVE Final   Influenza B by PCR NEGATIVE NEGATIVE Final    Comment: (NOTE) The Xpert Xpress SARS-CoV-2/FLU/RSV plus assay is intended as an aid in the diagnosis of influenza from Nasopharyngeal swab specimens and should not be used as a sole basis for treatment. Nasal washings and aspirates are unacceptable for Xpert Xpress SARS-CoV-2/FLU/RSV testing.  Fact Sheet for Patients: BloggerCourse.com  Fact Sheet for Healthcare Providers: SeriousBroker.it  This test is not yet approved or cleared by the [place]  FDA and has been authorized for detection and/or diagnosis of SARS-CoV-2 by FDA under an Emergency Use Authorization (EUA). This EUA will remain in effect (meaning this test can be used) for the duration of the COVID-19 declaration under Section 564(b)(1) of the Act, 21 U.S.C. section 360bbb-3(b)(1), unless the  authorization is terminated or revoked.     Resp Syncytial Virus by PCR NEGATIVE NEGATIVE Final    Comment: (NOTE) Fact Sheet for Patients: BloggerCourse.com  Fact Sheet for Healthcare Providers: SeriousBroker.it  This test is not yet approved or cleared by the [place]  FDA and has been authorized for detection and/or diagnosis of SARS-CoV-2 by FDA under an Emergency Use Authorization (EUA). This EUA will remain in effect (meaning this test can be used) for the duration of the COVID-19 declaration under Section 564(b)(1) of the Act, 21 U.S.C. section 360bbb-3(b)(1), unless the authorization is terminated or revoked.  Performed at [facility], [address], [place], [place] [address]   MRSA Next Gen by PCR, Nasal     Status: None   Collection Time: 12/28/23 11:48 AM   Specimen: Nasal Mucosa; Nasal Swab  Result Value Ref Range Status   MRSA by PCR Next Gen NOT DETECTED NOT DETECTED Final    Comment: (NOTE) The GeneXpert MRSA Assay (FDA approved for NASAL specimens only),  is one component of a comprehensive MRSA colonization surveillance program. It is not intended to diagnose MRSA infection nor to guide or monitor treatment for MRSA infections. Test performance is not FDA approved in patients less than 48 years old. Performed at [facility], [address] [place], [place] [address]   Respiratory (~20 pathogens) panel by PCR     Status: Abnormal   Collection Time: 12/28/23 11:48 AM   Specimen: Nasopharyngeal Swab; Respiratory  Result Value Ref Range Status   Adenovirus NOT DETECTED NOT DETECTED Final   Coronavirus 229E NOT DETECTED NOT DETECTED Final    Comment: (NOTE) The Coronavirus on the Respiratory Panel, DOES NOT test for the novel  Coronavirus (2019 nCoV)    Coronavirus HKU1 NOT DETECTED NOT DETECTED Final   Coronavirus NL63 NOT DETECTED NOT DETECTED Final   Coronavirus OC43 NOT DETECTED NOT DETECTED Final    Metapneumovirus NOT DETECTED NOT DETECTED Final   Rhinovirus / Enterovirus DETECTED (A) NOT DETECTED Final   Influenza A NOT DETECTED NOT DETECTED Final   Influenza B NOT DETECTED NOT DETECTED Final   Parainfluenza Virus 1 NOT DETECTED NOT DETECTED Final   Parainfluenza Virus 2 NOT DETECTED NOT DETECTED Final   Parainfluenza Virus 3 NOT DETECTED NOT DETECTED Final   Parainfluenza Virus 4 NOT DETECTED NOT DETECTED Final   Respiratory Syncytial Virus NOT DETECTED NOT DETECTED Final   Bordetella pertussis NOT DETECTED NOT DETECTED Final   Bordetella Parapertussis NOT DETECTED NOT DETECTED Final   Chlamydophila pneumoniae NOT DETECTED NOT DETECTED Final   Mycoplasma pneumoniae NOT DETECTED NOT DETECTED Final    Comment: Performed at [facility], [address], [place], [place] [address]  Expectorated Sputum Assessment w Gram Stain, Rflx to Resp Cult     Status: None   Collection Time: 12/30/23  5:49 AM   Specimen: Sputum  Result Value Ref Range Status   Specimen Description SPUTUM  Final   Special Requests NONE  Final   Sputum evaluation   Final    THIS SPECIMEN IS ACCEPTABLE FOR SPUTUM CULTURE Performed at [facility], [address], [place], [place] [address]    Report Status 12/30/2023 FINAL  Final  Culture, Respiratory w Gram Stain     Status: None (Preliminary result)   Collection Time: 12/30/23  5:49 AM   Specimen: SPU  Result Value Ref Range Status   Specimen Description   Final    SPUTUM Performed at [facility], [address] [place], [place] [address]    Special Requests   Final    NONE Reflexed from [id number] Performed at [facility], [address] [place], [place] [address]    Gram Stain   Final    ABUNDANT SQUAMOUS EPITHELIAL CELLS PRESENT WBC SEEN ABUNDANT GRAM NEGATIVE RODS ABUNDANT YEAST ABUNDANT GRAM POSITIVE COCCI    Culture   Final    CULTURE REINCUBATED FOR BETTER GROWTH Performed at [facility], [address] [place], [place]  [address]    Report Status PENDING  Incomplete     Scheduled Meds:  acetaminophen   1,000 mg Oral Q8H   albuterol   2.5 mg Nebulization Q8H   arformoterol   15 mcg Nebulization BID   atorvastatin   40 mg Oral QHS   azithromycin   500 mg Oral Daily   budesonide  (PULMICORT ) nebulizer solution  0.5 mg Nebulization BID   chlorpheniramine-HYDROcodone   5 mL Oral Q12H   cilostazol   100 mg Oral BID   cyanocobalamin   1,000 mcg Oral  q AM   gabapentin   300 mg Oral BID   insulin  aspart  0-5 Units Subcutaneous QHS   insulin  aspart  0-9 Units Subcutaneous TID WC   insulin  glargine-yfgn  5 Units Subcutaneous QHS   methylPREDNISolone  (SOLU-MEDROL ) injection  40 mg Intravenous Q12H   metoprolol  tartrate  25 mg Oral BID   pantoprazole  (PROTONIX ) IV  40 mg Intravenous Q12H   revefenacin   175 mcg Nebulization Daily   Continuous Infusions:  ceFEPime  (MAXIPIME ) IV 2 g (12/31/23 1052)    Procedures/Studies: CT Chest Wo Contrast Result Date: 12/28/2023 CLINICAL DATA:  Chest pain, nonspecific ongoing chest pain, post pna. * Tracking Code: BO * EXAM: CT CHEST WITHOUT CONTRAST TECHNIQUE: Multidetector CT imaging of the chest was performed following the standard protocol without IV contrast. RADIATION DOSE REDUCTION: This exam was performed according to the departmental dose-optimization program which includes automated exposure control, adjustment of the mA and/or kV according to patient size and/or use of iterative reconstruction technique. COMPARISON:  PET-CT scan from 12/27/2023 and CT angiography chest from 12/10/2023. FINDINGS: Cardiovascular: Normal cardiac size. No pericardial effusion. No aortic aneurysm. There are coronary artery calcifications, in keeping with coronary artery disease. There are also moderate peripheral atherosclerotic vascular calcifications of thoracic aorta and its major branches. 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 Mediastinum/Nodes: Visualized thyroid  gland appears grossly  unremarkable. No solid / cystic mediastinal masses. The esophagus is nondistended precluding optimal assessment. There are few mildly prominent mediastinal lymph nodes, which do not meet the size criteria for lymphadenopathy and appear grossly similar to the prior study, favoring benign etiology. No axillary lymphadenopathy by size criteria. Evaluation of bilateral hila is limited due to lack on intravenous contrast: however, no large hilar lymphadenopathy identified. Lungs/Pleura: The central tracheo-bronchial tree is patent. There are patchy, heterogeneous, irregular opacities predominantly involving the left upper lobe, which are new since the prior study. There are also new heterogeneous scattered opacities in the left lung lower lobe. There is continued interval improvement in the opacities in the right upper lobe. Several areas of subsegmental atelectasis throughout bilateral lungs are essentially unchanged in the interim. No new dense consolidation, lung collapse or pleural effusion. Evaluation for lung nodule is limited due to background lung parenchymal changes. Upper Abdomen: Small volume dependent calcified gallstones noted without imaging signs of acute cholecystitis. Remaining visualized upper abdominal viscera within normal limits. Musculoskeletal: The visualized soft tissues of the chest wall are grossly unremarkable. Redemonstration of mixed sclerotic/lytic lesions in the T7 and T9 vertebral body, essentially similar to the prior study. There is pathological fracture with mild compression deformity of T9 vertebral body. There is also ill-defined lytic lesion in the posterolateral right fourth rib (series 2, image 54), unchanged. Subacute/healing pathological fracture of right seventh rib also appears essentially unchanged. There is acute minimally displaced fracture of the anteromedial left sixth rib (series 2, image 79, which was present on the prior exam as well. There are mild multilevel  degenerative changes in the visualized spine. IMPRESSION: 1. There are new heterogeneous opacities in the left lung, as described above, which are nonspecific and may represent multifocal pneumonia. There is continued interval improvement in the opacities in the right upper lobe. 2. Redemonstration of osseous metastases, essentially similar to the prior study, including pathological fracture of T9 vertebral body and right seventh rib. 3. There is minimally displaced fracture of the anteromedial left sixth rib, present since the prior study. No metastatic lesion seen at the fracture site. 4. Multiple other nonacute observations, as  described above. Aortic Atherosclerosis (ICD10-I70.0). Electronically Signed   By: Beula Brunswick M.D.   On: 12/28/2023 08:42   DG Chest 2 View Result Date: 12/28/2023 CLINICAL DATA:  Shortness of breath. EXAM: CHEST - 2 VIEW COMPARISON:  12/10/2023 FINDINGS: Low lung volumes. Stable asymmetric elevation left hemidiaphragm. Streaky density at the left base is similar to prior compatible with atelectasis or scarring. Posterior displaced fracture noted in a mid right rib, likely ribs 7. There is some bony callus at the fracture site suggesting nonacute injury. No pneumothorax or substantial pleural effusion. Telemetry leads overlie the chest. IMPRESSION: 1. Low lung volumes with streaky density at the left base compatible with atelectasis or scarring. 2. Nonacute posterior displaced fracture in a mid right rib, likely rib 7. Electronically Signed   By: Donnal Fusi M.D.   On: 12/28/2023 07:27   IR Fluoro Guide Ndl Plmt / BX Result Date: 12/27/2023 INDICATION: Enhancing abnormality of the T9 vertebral body on recent MRI of the thoracic spine. EXAM: BONE BIOPSY AT T9 MEDICATIONS: None. ANESTHESIA/SEDATION: Moderate (conscious) sedation was employed during this procedure. A total of Versed  2 mg and Fentanyl  50 mcg was administered intravenously by the radiology nurse. Total intra-service  moderate Sedation Time: 26 minutes. The patient's level of consciousness and vital signs were monitored continuously by radiology nursing throughout the procedure under my direct supervision. Fluoro time: 6 minutes 48 seconds.  272 mGy. COMPLICATIONS: None immediate. PROCEDURE: Informed written consent was obtained from the patient after a thorough discussion of the procedural risks, benefits and alternatives. All questions were addressed. Maximal Sterile Barrier Technique was utilized including caps, mask, sterile gowns, sterile gloves, sterile drape, hand hygiene and skin antiseptic. A timeout was performed prior to the initiation of the procedure. With the patient prone on the fluoroscopic table, the thoracolumbar region was prepped and draped in the usual manner. The T9 vertebral body was identified on the AP and lateral projection by counting from the sacral region. The skin overlying the right pedicle was then infiltrated with 0.25% bupivacaine , and extended into the underlying paraspinal musculature. Using biplane intermittent fluoroscopy, a 13 gauge Cook spinal needle was advanced into the posterior aspect of T9. Through this, 2 passes were made with a 16 gauge core biopsy needle. Two core samples were obtained and sent in formalin for pathologic evaluation. Hemostasis was achieved at the skin entry site. Patient was then returned to short-stay for recovery. IMPRESSION: Status post fluoroscopic guided core biopsy at T9 vertebral body as described above without event. Electronically Signed   By: Luellen Sages M.D.   On: 12/27/2023 09:02   CT Angio Chest PE W and/or Wo Contrast Result Date: 12/10/2023 CLINICAL DATA:  High probability pulmonary embolism, pneumonia, syncope EXAM: CT ANGIOGRAPHY CHEST WITH CONTRAST TECHNIQUE: Multidetector CT imaging of the chest was performed using the standard protocol during bolus administration of intravenous contrast. Multiplanar CT image reconstructions and MIPs  were obtained to evaluate the vascular anatomy. RADIATION DOSE REDUCTION: This exam was performed according to the departmental dose-optimization program which includes automated exposure control, adjustment of the mA and/or kV according to patient size and/or use of iterative reconstruction technique. CONTRAST:  60mL OMNIPAQUE  IOHEXOL  350 MG/ML SOLN COMPARISON:  12/07/2023 FINDINGS: Cardiovascular: Status post coronary artery bypass grafting. Global cardiac size within limits. No pericardial effusion. Central pulmonary arteries are of normal caliber. No intraluminal filling defect identified through the segmental level to suggest acute pulmonary embolism. Extensive atherosclerotic calcification within the thoracic aorta. No aortic aneurysm. Mediastinum/Nodes:  No enlarged mediastinal, hilar, or axillary lymph nodes. Thyroid  gland, trachea, and esophagus demonstrate no significant findings. Lungs/Pleura: Multifocal pulmonary infiltrates are again identified, slightly improved within the left upper lobe, but progressive within the right upper lobe in keeping with changes of multifocal pneumonia. Left lower lobe platelike atelectasis. No pneumothorax or pleural effusion. No central obstructing lesion. Upper Abdomen: Cholelithiasis without pericholecystic inflammatory change. No acute abnormality within visualized upper abdomen. 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 Musculoskeletal: Multiple lytic lesions are again identified within the T7 and T9 vertebral bodies, with pathologic fracture of the T9 vertebral body, and right 4 and 7 ribs, pathologic fracture right seventh rib. The findings are in keeping with with metastatic disease, less likely multiple myeloma. These are better assessed on MRI examination 12/07/2023. Review of the MIP images confirms the above findings. IMPRESSION: 1. No acute pulmonary embolism. 2.  Multifocal pulmonary infiltrates, slightly improved within the left upper lobe, but progressive within the right upper lobe in keeping with changes of multifocal pneumonia. 3. Multiple lytic lesions within the T7 and T9 vertebral bodies, with pathologic fracture of the T9 vertebral body, and right 4 and 7 ribs, and pathologic fracture of the right seventh rib. The findings are in keeping with metastatic disease, less likely multiple myeloma. These are better assessed on MRI examination 12/07/2023. 4. 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 Clinical correlation for signs and symptoms chronic mesenteric ischemia recommended. If indicated, this could be better assessed with dedicated CT arteriography. 5. Cholelithiasis. Electronically Signed   By: Worthy Heads M.D.   On: 12/10/2023 20:19   CT Head Wo Contrast Result Date: 12/10/2023 CLINICAL DATA:  Head trauma, minor (Age >= 65y).  Fall from chair. EXAM: CT HEAD WITHOUT CONTRAST TECHNIQUE: Contiguous axial images were obtained from the base of the skull through the vertex without intravenous contrast. RADIATION DOSE REDUCTION: This exam was performed according to the departmental dose-optimization program which includes automated exposure control, adjustment of the mA and/or kV according to patient size and/or use of iterative reconstruction technique. COMPARISON:  Head CT 09/13/2018 and MRI 03/29/2018 FINDINGS: Brain: There is no evidence of an acute infarct, intracranial hemorrhage, mass, midline shift, or extra-axial fluid collection. There is mild cerebral atrophy. A normal variant cavum septum pellucidum et vergae is noted. A lacunar infarct in the right corona radiata is new but chronic in appearance. Mild hypodensities elsewhere in the cerebral white matter bilaterally are nonspecific but compatible with chronic small vessel ischemia. Vascular: Calcified  atherosclerosis at the skull base. No hyperdense vessel. Skull: No acute fracture or suspicious lesion. Sinuses/Orbits: Visualized paranasal sinuses are clear. Moderately large right mastoid effusion. Unremarkable orbits. Other: None. IMPRESSION: 1. No evidence of acute intracranial abnormality. 2. Mild chronic small vessel ischemia. Electronically Signed   By: Aundra Lee M.D.   On: 12/10/2023 20:10   DG Chest Portable 1 View Result Date: 12/10/2023 CLINICAL DATA:  Recent pneumonia.  Syncope. EXAM: PORTABLE CHEST 1 VIEW COMPARISON:  Chest radiograph dated 12/07/2023. FINDINGS: Shallow inspiration. Faint bilateral mid to lower lung field interstitial densities may represent atelectasis or infiltrate. No consolidative changes. No large pleural effusion or pneumothorax. Stable cardiac silhouette. Median sternotomy wires and CABG vascular clips. No acute osseous pathology. IMPRESSION: Shallow inspiration with bilateral mid to lower lung field atelectasis or infiltrate. Electronically Signed   By: Marene Shape.D.  On: 12/10/2023 17:53   MR THORACIC SPINE W WO CONTRAST Result Date: 12/07/2023 CLINICAL DATA:  Thoracic spine compression fracture. Back pain. Decreased appetite. EXAM: MRI THORACIC WITHOUT AND WITH CONTRAST TECHNIQUE: Multiplanar and multiecho pulse sequences of the thoracic spine were obtained without and with intravenous contrast. CONTRAST:  6mL GADAVIST  GADOBUTROL  1 MMOL/ML IV SOLN COMPARISON:  CT chest without contrast 12/07/2023. MRI of the thoracic spine without and with contrast 02/22/2021 FINDINGS: Alignment: No significant listhesis is present. Leftward curvature of the thoracic spine is centered at T8-9. Vertebrae: Pathologic compression fracture is confirmed at T9-10. Abnormal enhancement extending extends into an expanded right pedicle. Prominent lesion across the inferior right lateral aspect of the T7 vertebral body measures 2.2 x 1.4 cm. Extensive enhancement encompasses the  majority of the T9 vertebral body with some sparing on the left. 11 mm rounded enhancing lesion is present in the left T4. Multiple lesions are present at T2, the largest anteriorly on the left along the inferior endplate measuring 11 mm. No other pathologic fractures are present. No significant extraosseous tumor is present. Multiple rib lesions are present bilaterally without a pathologic rib fracture. Cord: Central T2 hyperintensities noted in the cord at the T2-3 level. No other cord signal changes present. Cord morphology is within normal limits. Paraspinal and other soft tissues: The paraspinous soft tissues are within normal limits. The visualized lung fields are clear. No primary lesion is identified. Disc levels: No significant central disc protrusion or central canal stenosis is present. Facet hypertrophy contributes 2 right greater than left foraminal stenosis from T8-9 through T11-12. Moderate right foraminal stenosis is also present at T7-8. IMPRESSION: 1. Pathologic compression fracture at T9-10 with abnormal enhancement extending into an expanded right pedicle. 2. Multiple enhancing lesions throughout the thoracic spine as described compatible with metastatic disease or multiple myeloma. 3. Multiple rib lesions bilaterally without a pathologic rib fracture. 4. Central T2 hyperintensity in the cord at the T2-3 level is nonspecific but may represent a small focus of myelomalacia. 5. No significant central canal stenosis. 6. Facet hypertrophy contributes to right greater than left foraminal stenosis from T8-9 through T11-12. Moderate right foraminal stenosis is also present at T7-8. These results were called by telephone at the time of interpretation on 12/07/2023 at 6:31 pm to provider JULIE HAVILAND , who verbally acknowledged these results. Electronically Signed   By: Audree Leas M.D.   On: 12/07/2023 18:31   MR Lumbar Spine W Wo Contrast Result Date: 12/07/2023 CLINICAL DATA:  Back pain  and chills. EXAM: MRI LUMBAR SPINE WITHOUT AND WITH CONTRAST TECHNIQUE: Multiplanar and multiecho pulse sequences of the lumbar spine were obtained without and with intravenous contrast. CONTRAST:  6mL GADAVIST  GADOBUTROL  1 MMOL/ML IV SOLN COMPARISON:  CT of the abdomen and pelvis without contrast 04/13/2023 FINDINGS: Segmentation: 5 non rib-bearing lumbar type vertebral bodies are present. The lowest fully formed vertebral body is L5. Alignment: No significant listhesis is present. Rightward curvature of the lumbar spine is centered at L3. Vertebrae: Multiple enhancing lesions are present throughout the lumbar spine. Lesion anteriorly on the left subjacent to the superior endplate of L3 measures 3.1 x 2.6 x 1.7 cm. And a lesion inferiorly in anteriorly on the right measures 1.8 x 1.5 x 1.8 cm. No pathologic fractures are present. Multiple other smaller lesions are present throughout the lumbar spine, visualized sacrum and iliac bones. Conus medullaris and cauda equina: Conus extends to the L1 level. Conus and cauda equina appear normal. Paraspinal and other soft  tissues: Simple cysts are present in the pole of both kidneys. The largest is on the right measuring 3 cm. Atherosclerotic changes are present in the aorta and branch vessels. Abnormal signal in the proximal left iliac artery raises concern for occlusion or slow flow. No significant adenopathy is present. No primary lesion is identified. Disc levels: L1-2: Mild disc bulging and facet hypertrophy is present. No significant stenosis is present. L2-3: A broad-based disc protrusion is asymmetric to the left. Mild facet hypertrophy is noted bilaterally. Mild left subarticular and moderate left foraminal stenosis is present. L3-4: A broad-based disc protrusion is present. Moderate facet hypertrophy is worse on the right. This results in mild subarticular and moderate foraminal stenosis bilaterally, right greater than left. L4-5: A broad-based disc protrusion is  present. Moderate facet hypertrophy is worse on the right. Moderate to severe central canal stenosis is present with right greater than left subarticular narrowing. Moderate foraminal stenosis is worse on the left. L5-S1: A shallow right paramedian disc protrusion is present. This potentially contacts the traversing right L5 nerve roots. Facet spurring contributes to moderate right foraminal stenosis. IMPRESSION: 1. Multiple enhancing lesions throughout the lumbar spine, visualized sacrum and iliac bones compatible with metastatic disease or multiple myeloma. 2. No pathologic fractures. 3. Abnormal signal in the proximal left iliac artery raises concern for occlusion or slow flow. 4. 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 5. Mild left subarticular and moderate left foraminal stenosis at L2-3. 6. Mild subarticular and moderate foraminal stenosis bilaterally at L3-4, right greater than left. 7. Moderate to severe central canal stenosis at L4-5 with right greater than left subarticular narrowing. 8. Moderate foraminal stenosis bilaterally at L4-5 is worse on the left. 9. Shallow right paramedian disc protrusion at L5-S1 potentially contacts the traversing right L5 nerve roots. 10. Moderate right foraminal stenosis at L5-S1. These results were called by telephone at the time of interpretation on 12/07/2023 at 5:57 Pm to provider [facility], who verbally acknowledged these results. Electronically Signed   By: Audree Leas M.D.   On: 12/07/2023 18:17   CT Chest Wo Contrast Result Date: 12/07/2023 CLINICAL DATA:  Back pain, weakness, decreased appetite EXAM: CT CHEST WITHOUT CONTRAST TECHNIQUE: Multidetector CT imaging of the chest was performed following the standard protocol without IV contrast. RADIATION DOSE REDUCTION: This exam was performed according to the departmental dose-optimization program which includes automated exposure control, adjustment of the mA and/or kV according to patient  size and/or use of iterative reconstruction technique. COMPARISON:  12/07/2023, 08/28/2023 FINDINGS: Cardiovascular: Unenhanced imaging of the heart is unremarkable without pericardial effusion. 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 Dense atherosclerosis of the native coronary vasculature. Normal caliber of the thoracic aorta. Extensive atherosclerosis of the aorta and its branches. Assessment of the vascular lumen cannot be performed without IV contrast. Mediastinum/Nodes: No enlarged mediastinal or axillary lymph nodes. Thyroid  gland, trachea, and esophagus demonstrate no significant findings. Lungs/Pleura: Chronic elevation of the left hemidiaphragm, with mild left lower lobe bronchiectasis and scarring again noted. There are also linear opacities within the left upper lobe consistent with scarring seen previously. However, there is some new patchy nodular airspace disease within the bilateral upper lobes, left greater than right, concerning for superimposed pneumonia. No effusion or pneumothorax. Central airways are patent. Upper Abdomen: Cholelithiasis without evidence of acute cholecystitis. No other acute upper abdominal findings. Musculoskeletal: The mixed sclerotic and lucent lesion within the T9 vertebral body with associated right paraspinal soft tissue component is again identified, measuring  2.4 x 2.5 cm reference image 98/2. Since the previous exam, it appears that there is slight invagination of the superior endplate of the T9 vertebral body on the right, consistent with superimposed pathologic fracture. Less than 10% loss of height. No retropulsion. Further evaluation with MRI may be useful given symptoms of back pain. There is a healing subacute to chronic right posterolateral seventh rib fracture with abundant callus formation, new since prior CT. No other acute bony abnormalities. Reconstructed images demonstrate no additional findings. IMPRESSION: 1. New fracture  through the superior endplate of the T9 vertebral body at the site of the mixed sclerotic and lucent lesion seen on prior CT and MRI exams, consistent with pathologic fracture. Further evaluation with MRI may be useful. 2. Patchy bilateral upper lobe nodular airspace disease consistent with inflammatory or infectious etiology. 3. Chronic areas of scarring or atelectasis within the left upper and left lower lobes, with stable elevation of the left hemidiaphragm. 4.  Aortic Atherosclerosis (ICD10-I70.0). Electronically Signed   By: Bobbye Burrow M.D.   On: 12/07/2023 16:39   DG Chest Portable 1 View Result Date: 12/07/2023 CLINICAL DATA:  Cough, back pain EXAM: PORTABLE CHEST - 1 VIEW COMPARISON:  08/09/2023 FINDINGS: Low lung volumes. Chronic linear scarring or atelectasis laterally in the left mid lung. No new infiltrate or overt edema. Heart size and mediastinal contours are within normal limits. Aortic Atherosclerosis (ICD10-170.0). CABG markers. No effusion. Sternotomy wires. IMPRESSION: Low volumes with chronic left mid lung scarring or atelectasis. Electronically Signed   By: Nicoletta Barrier M.D.   On: 12/07/2023 13:29    Demaris Fillers, DO  [facility]  If 7PM-7AM, please contact night-coverage [email or web address] Password [email or web address] 12/31/2023, 11:42 AM   LOS: 3 days

## 2023-12-31 NOTE — Plan of Care (Signed)

## 2023-12-31 NOTE — Progress Notes (Signed)
 Repeat H/H 6.8. 2 units PRBCs ordered by hospitalist. Keep plans for EGD tomorrow. NPO after midnight.

## 2023-12-31 NOTE — Inpatient Diabetes Management (Signed)
 Inpatient Diabetes Program Recommendations  AACE/ADA: New Consensus Statement on Inpatient Glycemic Control   Target Ranges:  Prepandial:   less than 140 mg/dL      Peak postprandial:   less than 180 mg/dL (1-2 hours)      Critically ill patients:  140 - 180 mg/dL    Latest Reference Range & Units 12/30/23 07:46 12/30/23 11:29 12/30/23 16:34 12/30/23 22:11 12/31/23 07:25  Glucose-Capillary 70 - 99 mg/dL 696 (H) 295 (H) 284 (H) 314 (H) 278 (H)   Review of Glycemic Control  Diabetes history: DM2 Outpatient Diabetes medications: Lantus  20 units at bedtime, Jardiance  12.5 mg daily Current orders for Inpatient glycemic control: Semglee  5 units at bedtime, Novolog  0-9 units TID with meals, Novolog  0-5 units at bedtime; Solumedrol 40 mg Q12H  Inpatient Diabetes Program Recommendations:    Insulin : If steroids are continued as ordered, please consider increasing Semglee  to 8 units at bedtime and ordering Novolog  3 units TID with meals for meal coverage if patient eats at least 50% of meals.  Thanks, Beacher Limerick, RN, MSN, CDCES Diabetes Coordinator Inpatient Diabetes Program [phone] (Team Pager from 8am to 5pm)

## 2023-12-31 NOTE — Progress Notes (Addendum)
 CRITICAL VALUE STICKER  CRITICAL VALUE: hemoglobin 6.8  RECEIVER (on-site recipient of call): Vickye Grant, RN  DATE & TIME NOTIFIED: 12/31/2023 at [address] (representative from lab): Katrina Parma  MD NOTIFIED: Dr. Myrtie Atkinson Tat   TIME OF NOTIFICATION: 12/31/2023 at 1555  RESPONSE: see new orders

## 2023-12-31 NOTE — H&P (View-Only) (Signed)
 Gastroenterology Consult   Referring Provider: Dr. Winferd Hatter  Primary Care Physician:  [name] Primary Gastroenterologist:  Dr. Riley Cheadle   Patient ID: [name]; [id number]; 1945/11/03   Admit date: 12/28/2023  LOS: 3 days   Date of Consultation: 12/31/2023  Reason for Consultation:  Coffee-ground emesis   History of Present Illness   [name] is a 78 y.o. year old male with GI history significant for GERD with Grade D reflux esophagitis, complicated diverticulitis s/p diverting colostomy May 2019 and reversal Aug 2019, history of adenomas with colonoscopy up-to-date (Oct 2024). Additional medical history includes CAD s/p CABG, HTN, DM, HLD, CKD, PVD, on Plavix  and aspirin  as outpatient, metastatic bone lesions with evaluation underway by Dr. Katragadda for primary source, admitted with pneumonia, COPD exacerbation, acute on chronic renal failure, and now GI consulted due to acute vomiting this morning with coffee-ground emesis.    Chronic microcytic anemia noted, with Hgb 8.2 today. Appears to be in 9/10 range. Hgb stable past 24 hours. On admission, Hgb was 10.7. Outpatient taking PPI BID. He notes acute onset of vomiting this morning large amounts of black emesis. Started after breathing treatment. Was doing well prior to this. Last vomiting at 1030. Nausea improved. He was able to drink some clear liquids. No abdominal pain currently. No oxygen. Feels breathing is improved. No melena. Unclear if was on PPI during this admission, but IV BID has been started today. On aspirin  81 mg and plavix  as outpatient. Denies other NSAIDs.   Last dose of Plavix  this morning and has been discontinued. Aspirin  on hold as well.   Endoscopic Evaluations Oct 2024 TCS: surgical anastomosis at 25 cm, s/p segmental biopsy, Path: negative biopsies  EGD Oct 2024: Grade D reflux esophagitis, some distal denuding of esophageal mucosa s/p dilation, small hiatal hernia, normal dudoenum. Unable to  exclude pill-induced injury as was taking meds at night and then laying down.     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   x 5   Hypercholesteremia    Hypertension    Neuropathy    Peripheral vascular disease (HCC)    s/p R-L FEM-FEM BYPASS   Pneumonia    Tachycardia    after CABG, pt on medicine for this    Past Surgical History:  Procedure Laterality Date   ABDOMINAL AORTOGRAM Bilateral 09/19/2016   Procedure: iliac;  Surgeon: Margherita Shell, MD;  Location: [facility];  Service: Cardiovascular;  Laterality: Bilateral;   ABDOMINAL AORTOGRAM W/LOWER EXTREMITY N/A 09/22/2021   Procedure: ABDOMINAL AORTOGRAM W/LOWER EXTREMITY;  Surgeon: Young Hensen, MD;  Location: [facility];  Service: Cardiovascular;  Laterality: N/A;   ABDOMINAL AORTOGRAM W/LOWER EXTREMITY N/A 10/05/2022   Procedure: ABDOMINAL AORTOGRAM W/LOWER EXTREMITY;  Surgeon: Young Hensen, MD;  Location: [facility];  Service: Cardiovascular;  Laterality: N/A;   BIOPSY  05/16/2023   Procedure: BIOPSY;  Surgeon: Suzette Espy, MD;  [facility]: [facility];  Service: Endoscopy;;   CARDIAC CATHETERIZATION  2003   with stent   CARDIAC CATHETERIZATION N/A 10/11/2015   Procedure: Left Heart Cath  and Coronary Angiography;  Surgeon: Peter M Swaziland, MD;  [facility];  Service: Cardiovascular;  Laterality: N/A;   COLON RESECTION N/A 12/13/2017   Procedure: EXPLORATORY LAPAROTOMY, SIGMOID COLECTOMY WITH COLOSTOMY;  Surgeon: Dareen Ebbing, MD;  Location: [facility];  Service: General;  Laterality: N/A;   COLONOSCOPY N/A 09/22/2013   Procedure:  COLONOSCOPY;  Surgeon: Suzette Espy, MD;  [facility]: [facility];  Service: Endoscopy;  Laterality: N/A;  9:30 AM   COLONOSCOPY N/A 02/19/2018   Procedure: COLONOSCOPY;  Surgeon: Suzette Espy, MD;  [facility];  Service: Endoscopy;  Laterality: N/A;  2:00pm   COLONOSCOPY WITH PROPOFOL  N/A 05/16/2023   Procedure: COLONOSCOPY WITH PROPOFOL ;  Surgeon: Suzette Espy, MD;  [facility];  Service: Endoscopy;  Laterality: N/A;  730am, asa 3   COLOSTOMY N/A 12/13/2017   Procedure: COLOSTOMY;  Surgeon: Dareen Ebbing, MD;  Location[facility] OR;  Service: General;  Laterality: N/A;   COLOSTOMY REVERSAL N/A 03/26/2018   Procedure: COLOSTOMY REVERSAL;  Surgeon: Dareen Ebbing, MD;  [facility]: [facility];  Service: General;  Laterality: N/A;   CORONARY ARTERY BYPASS GRAFT N/A 10/18/2015   Procedure: CORONARY ARTERY BYPASS GRAFTING (CABG) x  five, using left internal mammary artery and right leg greater saphenous vein harvested endoscopically;  Surgeon: Zelphia Higashi, MD;  Location: [facility];  Service: Open Heart Surgery;  Laterality: N/A;   ENDARTERECTOMY FEMORAL Right 08/09/2016   Procedure: ENDARTERECTOMY FEMORAL WITH VEIN PATCH ANGIOPLASTY;  Surgeon: Margherita Shell, MD;  Location: [facility];  Service: Vascular;  Laterality: Right;   ESOPHAGOGASTRODUODENOSCOPY N/A 10/24/2017   Dr. Riley Cheadle: hiatal hernia   ESOPHAGOGASTRODUODENOSCOPY (EGD) WITH PROPOFOL  N/A 05/16/2023   Procedure: ESOPHAGOGASTRODUODENOSCOPY (EGD) WITH PROPOFOL ;  Surgeon: Suzette Espy, MD;  [facility];  Service: Endoscopy;  Laterality: N/A;   FEMORAL-FEMORAL BYPASS GRAFT Bilateral 08/09/2016   Procedure: REVISION BYPASS GRAFT RIGHT FEMORAL-LEFT FEMORAL ARTERY;  Surgeon: Margherita Shell, MD;  Location: [facility] OR;  Service: Vascular;  Laterality: Bilateral;   FEMORAL-POPLITEAL BYPASS GRAFT     INTRAMEDULLARY (IM) NAIL INTERTROCHANTERIC Left 04/03/2021   Procedure: LEFT  HIP INTRAMEDULLARY (IM) NAIL  INTERTROCHANTRIC;  Surgeon: Arnie Lao, MD;  Location: [facility];  Service: Orthopedics;  Laterality: Left;   IR FLUORO GUIDED NEEDLE PLC ASPIRATION/INJECTION LOC  12/26/2023   MALONEY DILATION N/A 05/16/2023   Procedure: MALONEY DILATION;  Surgeon: Suzette Espy, MD;  [facility];  Service: Endoscopy;  Laterality: N/A;   PERIPHERAL VASCULAR BALLOON ANGIOPLASTY Right 09/22/2021   Procedure: PERIPHERAL VASCULAR BALLOON ANGIOPLASTY;  Surgeon: Young Hensen, MD;  [facility];  Service: Cardiovascular;  Laterality: Right;   PERIPHERAL VASCULAR CATHETERIZATION N/A 05/08/2016   Procedure: Lower Extremity Angiography;  Surgeon: Avanell Leigh, MD;  Location: [facility] INVASIVE CV LAB;  Service: Cardiovascular;  Laterality: N/A;   PERIPHERAL VASCULAR INTERVENTION Right 09/19/2016   Procedure: Peripheral Vascular Intervention;  Surgeon: Margherita Shell, MD;  [facility];  Service: Cardiovascular;  Laterality: Right;  ext iliac stent   PERIPHERAL VASCULAR INTERVENTION Right 09/22/2021   Procedure: PERIPHERAL VASCULAR INTERVENTION;  Surgeon: Young Hensen, MD;  [facility];  Service: Cardiovascular;  Laterality: Right;   POLYPECTOMY  02/19/2018   Procedure: POLYPECTOMY;  Surgeon: Suzette Espy, MD;  [facility];  Service: Endoscopy;;   PROCTOSCOPY  10/24/2017   Procedure: PROCTOSCOPY;  Surgeon: Suzette Espy, MD;  [facility];  Service: Endoscopy;;   TEE WITHOUT CARDIOVERSION N/A 10/18/2015   Procedure: TRANSESOPHAGEAL ECHOCARDIOGRAM (TEE);  Surgeon: Zelphia Higashi, MD;  Location[facility] OR;  Service: Open Heart Surgery;  Laterality: N/A;    Prior to Admission medications   Medication Sig Start Date End Date Taking? Authorizing Provider  amLODipine  (NORVASC ) 10 MG tablet Take 10 mg by mouth in the morning.   Yes [provider]  amoxicillin -clavulanate (AUGMENTIN ) 875-125 MG tablet  Take 1 tablet by mouth 2 (two) times daily. 12/25/23  Yes [provider]  aspirin  EC 81 MG tablet Take 1 tablet (81 mg total) by mouth daily with breakfast. 05/23/19  Yes Emokpae, Courage, MD  atorvastatin  (LIPITOR ) 40 MG tablet Take 1 tablet (40 mg total) by mouth at bedtime. 12/11/23  Yes Johnson, Clanford L, MD  baclofen (LIORESAL) 10 MG tablet Take 10 mg by mouth at bedtime. 12/05/23  Yes [provider]  cilostazol  (PLETAL ) 100 MG tablet Take 100 mg by mouth 2 (two) times daily.   Yes [provider]  clopidogrel  (PLAVIX ) 75 MG tablet Take 75 mg by mouth in the morning.   Yes [provider]  empagliflozin  (JARDIANCE ) 25 MG TABS tablet 12.5 mg daily as directed 12/11/23  Yes Johnson, Clanford L, MD  gabapentin  (NEURONTIN ) 600 MG tablet Take 600 mg by mouth 2 (two) times daily.   Yes [provider]  insulin  glargine (LANTUS ) 100 UNIT/ML injection Inject 0.1 mLs (10 Units total) into the skin at bedtime. Patient taking differently: Inject 20 Units into the skin at bedtime. 12/11/23  Yes Johnson, Clanford L, MD  losartan  (COZAAR ) 50 MG tablet Take 50 mg by mouth daily. 03/02/22  Yes [provider]  metoprolol  tartrate (LOPRESSOR ) 50 MG tablet Take 25 mg by mouth 2 (two) times daily.    Yes [provider]  pantoprazole  (PROTONIX ) 40 MG tablet Take 1 tablet (40 mg total) by mouth daily. 12/11/23  Yes Johnson, Clanford L, MD  traMADol  (ULTRAM ) 50 MG tablet Take 0.5 tablets (25 mg total) by mouth every 12 (twelve) hours as needed for severe pain (pain score 7-10). 12/11/23 12/10/24 Yes Johnson, Clanford L, MD  vitamin B-12 (CYANOCOBALAMIN ) 1000 MCG tablet Take 1,000 mcg by mouth in the morning.   Yes [provider]  doxycycline  (VIBRA -TABS) 100 MG tablet Take 100 mg by mouth 2 (two) times daily. Patient not taking: Reported on 12/28/2023 12/19/23   [provider]    Current Facility-Administered Medications  Medication Dose  Route Frequency Provider Last Rate Last Admin   acetaminophen  (TYLENOL ) tablet 650 mg  650 mg Oral Q6H PRN Tat, Myrtie Atkinson, MD       Or   acetaminophen  (TYLENOL ) suppository 650 mg  650 mg Rectal Q6H PRN Tat, Myrtie Atkinson, MD       acetaminophen  (TYLENOL ) tablet 1,000 mg  1,000 mg Oral Q8H Tat, Myrtie Atkinson, MD   1,000 mg at 12/31/23 0537   albuterol  (PROVENTIL ) (2.5 MG/3ML) 0.083% nebulizer solution 2.5 mg  2.5 mg Nebulization Q4H PRN Tat, Myrtie Atkinson, MD       albuterol  (PROVENTIL ) (2.5 MG/3ML) 0.083% nebulizer solution 2.5 mg  2.5 mg Nebulization Q8H Tat, Myrtie Atkinson, MD   2.5 mg at 12/31/23 1610   arformoterol  (BROVANA ) nebulizer solution 15 mcg  15 mcg Nebulization BID Demaris Fillers, MD   15 mcg at 12/31/23 9604   atorvastatin  (LIPITOR ) tablet 40 mg  40 mg Oral QHS Tat, David, MD   40 mg at 12/30/23  2219   azithromycin  (ZITHROMAX ) tablet 500 mg  500 mg Oral Daily Tat, David, MD   500 mg at 12/31/23 0840   budesonide  (PULMICORT ) nebulizer solution 0.5 mg  0.5 mg Nebulization BID Tat, David, MD   0.5 mg at 12/31/23 0865   ceFEPIme  (MAXIPIME ) 2 g in sodium chloride  0.9 % 100 mL IVPB  2 g Intravenous Q24H Madueme, Elvira C, RPH 200 mL/hr at 12/30/23 0951 2 g at 12/30/23 0951   chlorpheniramine-HYDROcodone  (TUSSIONEX) 10-8 MG/5ML suspension 5 mL  5 mL Oral Q12H Tat, David, MD   5 mL at 12/31/23 0845   cilostazol  (PLETAL ) tablet 100 mg  100 mg Oral BID Tat, Myrtie Atkinson, MD   100 mg at 12/31/23 0840   cyanocobalamin  (VITAMIN B12) tablet 1,000 mcg  1,000 mcg Oral q AM Tat, Myrtie Atkinson, MD   1,000 mcg at 12/31/23 7846   gabapentin  (NEURONTIN ) capsule 300 mg  300 mg Oral BID Tat, Myrtie Atkinson, MD   300 mg at 12/31/23 0840   hydrOXYzine  (ATARAX ) tablet 25 mg  25 mg Oral TID PRN Demaris Fillers, MD       insulin  aspart (novoLOG ) injection 0-5 Units  0-5 Units Subcutaneous QHS Demaris Fillers, MD   4 Units at 12/30/23 2212   insulin  aspart (novoLOG ) injection 0-9 Units  0-9 Units Subcutaneous TID WC Demaris Fillers, MD   5 Units at 12/31/23 0845   insulin  glargine-yfgn  (SEMGLEE ) injection 5 Units  5 Units Subcutaneous QHS Demaris Fillers, MD   5 Units at 12/30/23 2212   methylPREDNISolone  sodium succinate (SOLU-MEDROL ) 40 mg/mL injection 40 mg  40 mg Intravenous Q12H Tat, David, MD   40 mg at 12/31/23 0845   metoprolol  tartrate (LOPRESSOR ) tablet 25 mg  25 mg Oral BID Tat, Myrtie Atkinson, MD   25 mg at 12/31/23 0840   ondansetron  (ZOFRAN ) tablet 4 mg  4 mg Oral Q6H PRN Tat, Myrtie Atkinson, MD       Or   ondansetron  (ZOFRAN ) injection 4 mg  4 mg Intravenous Q6H PRN Tat, Myrtie Atkinson, MD   4 mg at 12/31/23 0947   pantoprazole  (PROTONIX ) injection 40 mg  40 mg Intravenous Q12H TatMyrtie Atkinson, MD       revefenacin  (YUPELRI ) nebulizer solution 175 mcg  175 mcg Nebulization Daily Tat, Myrtie Atkinson, MD   175 mcg at 12/31/23 0720   traMADol  (ULTRAM ) tablet 50 mg  50 mg Oral Q12H PRN Demaris Fillers, MD        Allergies as of 12/28/2023 - Review Complete 12/28/2023  Allergen Reaction Noted   Acarbose Diarrhea 11/13/2012    Family History  Problem Relation Age of Onset   Heart attack Mother    Stroke Mother    Colon cancer Brother        late 22s    Social History   Socioeconomic History   Marital status: Married    Spouse name: Not on file   Number of children: Not on file   Years of education: Not on file   Highest education level: Not on file  Occupational History   Not on file  Tobacco Use   Smoking status: Former    Current packs/day: 0.00    Average packs/day: 2.0 packs/day for 30.0 years (60.0 ttl pk-yrs)    Types: Cigarettes    Start date: 08/14/1970    Quit date: 08/14/2000    Years since quitting: 23.3   Smokeless tobacco: Never  Vaping Use   Vaping status: Never Used  Substance and Sexual Activity  Alcohol  use: No   Drug use: No   Sexual activity: Not Currently  Other Topics Concern   Not on file  Social History Narrative   Not on file   Social Drivers of Health   Financial Resource Strain: Low Risk  (10/08/2023)   Received from [facility]   Overall Financial  Resource Strain (CARDIA)    Difficulty of Paying Living Expenses: Not very hard  Food Insecurity: No Food Insecurity (12/28/2023)   Hunger Vital Sign    Worried About Running Out of Food in the Last Year: Never true    Ran Out of Food in the Last Year: Never true  Recent Concern: Food Insecurity - Food Insecurity Present (12/18/2023)   Hunger Vital Sign    Worried About Running Out of Food in the Last Year: Sometimes true    Ran Out of Food in the Last Year: Sometimes true  Transportation Needs: No Transportation Needs (12/28/2023)   PRAPARE - Administrator, Civil Service (Medical): No    Lack of Transportation (Non-Medical): No  Physical Activity: Not on file  Stress: Not on file  Social Connections: Socially Integrated (12/28/2023)   Social Connection and Isolation Panel [NHANES]    Frequency of Communication with Friends and Family: More than three times a week    Frequency of Social Gatherings with Friends and Family: Three times a week    Attends Religious Services: More than 4 times per year    Active Member of Clubs or Organizations: Yes    Attends Banker Meetings: 1 to 4 times per year    Marital Status: Married  Catering manager Violence: Not At Risk (12/28/2023)   Humiliation, Afraid, Rape, and Kick questionnaire    Fear of Current or Ex-Partner: No    Emotionally Abused: No    Physically Abused: No    Sexually Abused: No     Review of Systems   Gen: Denies any fever, chills, loss of appetite, change in weight or weight loss CV: Denies chest pain, heart palpitations, syncope, edema  Resp: Denies shortness of breath with rest, cough, wheezing, coughing up blood, and pleurisy. GI: seeHPI GU : Denies urinary burning, blood in urine, urinary frequency, and urinary incontinence. MS: Denies joint pain, limitation of movement, swelling, cramps, and atrophy.  Derm: Denies rash, itching, dry skin, hives. Psych: Denies depression, anxiety, memory loss,  hallucinations, and confusion. Heme: Denies bruising or bleeding Neuro:  Denies any headaches, dizziness, paresthesias, shaking  Physical Exam   Vital Signs in last 24 hours: Temp:  [97.1 F (36.2 C)-98 F (36.7 C)] 97.9 F (36.6 C) (05/19 1000) Pulse Rate:  [100-105] 105 (05/19 1000) Resp:  [16-20] 20 (05/19 1000) BP: (147-158)/(61-65) 157/62 (05/19 1000) SpO2:  [94 %-100 %] 98 % (05/19 0727) Last BM Date : 12/28/23  General:   Alert,  Well-developed, well-nourished, pleasant and cooperative in NAD Head:  Normocephalic and atraumatic. Eyes:  Sclera clear, no icterus.   Conjunctiva pink. Ears:  mild hard of hearing Lungs:  coarse, scattered rhonchi, no distress Heart:  S1 S2 present without obvious murmur Abdomen:  Soft, nontender and nondistended. Limited exam as patient sitting up in chair Rectal: deferred   Msk:  Symmetrical without gross deformities. Normal posture. Extremities:  Without edema. Neurologic:  Alert and  oriented x4.   Intake/Output from previous day: 05/18 0701 - 05/19 0700 In: 1470.4 [P.O.:240; I.V.:1230.4] Out: -  Intake/Output this shift: No intake/output data recorded.  @WEIGHTS @  Labs/Studies   Recent Labs Recent Labs    12/29/23 0352 12/30/23 0347 12/31/23 0811  WBC 9.4 11.2* 15.2*  HGB 9.2* 8.1* 8.2*  HCT 30.1* 26.6* 26.0*  PLT 458* 381 580*   BMET Recent Labs    12/29/23 0352 12/30/23 0347 12/31/23 0413  NA 135 134* 134*  K 3.6 4.0 4.5  CL 104 106 105  CO2 20* 20* 22  GLUCOSE 138* 101* 239*  BUN 38* 38* 57*  CREATININE 2.85* 2.55* 2.34*  CALCIUM  8.7* 8.4* 9.1      Assessment   [name] is a 78 y.o. year old male with multimorbidities including CAD s/p CABG, HTN, DM, HLD, CKD, PVD, on Plavix  and aspirin  as outpatient, metastatic bone lesions with evaluation underway by Dr. Cheree Cords for primary source, GERD with Grade D reflux esophagitis, complicated diverticulitis s/p diverting colostomy May 2019 and reversal Aug  2019, history of adenomas with colonoscopy up-to-date (Oct 2024), admitted with pneumonia, COPD exacerbation, acute on chronic renal failure, and now GI consulted due to acute vomiting this morning with coffee-ground emesis.   Coffee ground emesis: suspect secondary to known Grade D esophagitis (EGD Oct 2024), unable to rule out gastritis/PUD, MW tear. Several episodes this morning acutely and now resolved with improved symptoms. No melena. Plavix  received this morning but now on hold, along with 81 mg aspirin . Chronic anemia noted, with Hgb 8.2 today, similar to yesterday but is down from Hgb 10.7 on admission. Suspect some is dilutional, but we will recheck H/H at 1500 today. Agree with IV BID PPI. Patient and wife are concerned and desire EGD prior to discharge. Suspect he will be appropriate for tomorrow, as he feels he is back to his baseline respiratory status.     Plan / Recommendations    H/H 1500 Agree with IV BID PPI Agree with holding subcutaneous heparin , plavix , and 81 mg aspirin  for now NPO after midnight As long as stable from respiratory standpoint tomorrow, can pursue diagnostic EGD by Dr. Alita Irwin.  Will reassess in the morning.      12/31/2023, 10:38 AM  Delman Ferns, PhD, ANP-BC [facility]

## 2023-12-31 NOTE — Consult Note (Signed)
 Gastroenterology Consult   Referring Provider: Dr. Winferd Hatter  Primary Care Physician:  [name] Primary Gastroenterologist:  Dr. Riley Cheadle   Patient ID: [name]; [id number]; 1945/11/03   Admit date: 12/28/2023  LOS: 3 days   Date of Consultation: 12/31/2023  Reason for Consultation:  Coffee-ground emesis   History of Present Illness   [name] is a 78 y.o. year old male with GI history significant for GERD with Grade D reflux esophagitis, complicated diverticulitis s/p diverting colostomy May 2019 and reversal Aug 2019, history of adenomas with colonoscopy up-to-date (Oct 2024). Additional medical history includes CAD s/p CABG, HTN, DM, HLD, CKD, PVD, on Plavix  and aspirin  as outpatient, metastatic bone lesions with evaluation underway by Dr. Katragadda for primary source, admitted with pneumonia, COPD exacerbation, acute on chronic renal failure, and now GI consulted due to acute vomiting this morning with coffee-ground emesis.    Chronic microcytic anemia noted, with Hgb 8.2 today. Appears to be in 9/10 range. Hgb stable past 24 hours. On admission, Hgb was 10.7. Outpatient taking PPI BID. He notes acute onset of vomiting this morning large amounts of black emesis. Started after breathing treatment. Was doing well prior to this. Last vomiting at 1030. Nausea improved. He was able to drink some clear liquids. No abdominal pain currently. No oxygen. Feels breathing is improved. No melena. Unclear if was on PPI during this admission, but IV BID has been started today. On aspirin  81 mg and plavix  as outpatient. Denies other NSAIDs.   Last dose of Plavix  this morning and has been discontinued. Aspirin  on hold as well.   Endoscopic Evaluations Oct 2024 TCS: surgical anastomosis at 25 cm, s/p segmental biopsy, Path: negative biopsies  EGD Oct 2024: Grade D reflux esophagitis, some distal denuding of esophageal mucosa s/p dilation, small hiatal hernia, normal dudoenum. Unable to  exclude pill-induced injury as was taking meds at night and then laying down.     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   x 5   Hypercholesteremia    Hypertension    Neuropathy    Peripheral vascular disease (HCC)    s/p R-L FEM-FEM BYPASS   Pneumonia    Tachycardia    after CABG, pt on medicine for this    Past Surgical History:  Procedure Laterality Date   ABDOMINAL AORTOGRAM Bilateral 09/19/2016   Procedure: iliac;  Surgeon: Margherita Shell, MD;  [facility]: [facility];  Service: Cardiovascular;  Laterality: Bilateral;   ABDOMINAL AORTOGRAM W/LOWER EXTREMITY N/A 09/22/2021   Procedure: ABDOMINAL AORTOGRAM W/LOWER EXTREMITY;  Surgeon: Young Hensen, MD;  Location: [facility];  Service: Cardiovascular;  Laterality: N/A;   ABDOMINAL AORTOGRAM W/LOWER EXTREMITY N/A 10/05/2022   Procedure: ABDOMINAL AORTOGRAM W/LOWER EXTREMITY;  Surgeon: Young Hensen, MD;  Location: [facility];  Service: Cardiovascular;  Laterality: N/A;   BIOPSY  05/16/2023   Procedure: BIOPSY;  Surgeon: Suzette Espy, MD;  [facility]: [facility];  Service: Endoscopy;;   CARDIAC CATHETERIZATION  2003   with stent   CARDIAC CATHETERIZATION N/A 10/11/2015   Procedure: Left Heart Cath  and Coronary Angiography;  Surgeon: Peter M Swaziland, MD;  [facility];  Service: Cardiovascular;  Laterality: N/A;   COLON RESECTION N/A 12/13/2017   Procedure: EXPLORATORY LAPAROTOMY, SIGMOID COLECTOMY WITH COLOSTOMY;  Surgeon: Dareen Ebbing, MD;  Location: [facility];  Service: General;  Laterality: N/A;   COLONOSCOPY N/A 09/22/2013   Procedure:  COLONOSCOPY;  Surgeon: Suzette Espy, MD;  [facility]: [facility];  Service: Endoscopy;  Laterality: N/A;  9:30 AM   COLONOSCOPY N/A 02/19/2018   Procedure: COLONOSCOPY;  Surgeon: Suzette Espy, MD;  [facility];  Service: Endoscopy;  Laterality: N/A;  2:00pm   COLONOSCOPY WITH PROPOFOL  N/A 05/16/2023   Procedure: COLONOSCOPY WITH PROPOFOL ;  Surgeon: Suzette Espy, MD;  [facility];  Service: Endoscopy;  Laterality: N/A;  730am, asa 3   COLOSTOMY N/A 12/13/2017   Procedure: COLOSTOMY;  Surgeon: Dareen Ebbing, MD;  Location[facility] OR;  Service: General;  Laterality: N/A;   COLOSTOMY REVERSAL N/A 03/26/2018   Procedure: COLOSTOMY REVERSAL;  Surgeon: Dareen Ebbing, MD;  [facility]: [facility];  Service: General;  Laterality: N/A;   CORONARY ARTERY BYPASS GRAFT N/A 10/18/2015   Procedure: CORONARY ARTERY BYPASS GRAFTING (CABG) x  five, using left internal mammary artery and right leg greater saphenous vein harvested endoscopically;  Surgeon: Zelphia Higashi, MD;  Location: [facility];  Service: Open Heart Surgery;  Laterality: N/A;   ENDARTERECTOMY FEMORAL Right 08/09/2016   Procedure: ENDARTERECTOMY FEMORAL WITH VEIN PATCH ANGIOPLASTY;  Surgeon: Margherita Shell, MD;  [facility]: [facility];  Service: Vascular;  Laterality: Right;   ESOPHAGOGASTRODUODENOSCOPY N/A 10/24/2017   Dr. Riley Cheadle: hiatal hernia   ESOPHAGOGASTRODUODENOSCOPY (EGD) WITH PROPOFOL  N/A 05/16/2023   Procedure: ESOPHAGOGASTRODUODENOSCOPY (EGD) WITH PROPOFOL ;  Surgeon: Suzette Espy, MD;  [facility];  Service: Endoscopy;  Laterality: N/A;   FEMORAL-FEMORAL BYPASS GRAFT Bilateral 08/09/2016   Procedure: REVISION BYPASS GRAFT RIGHT FEMORAL-LEFT FEMORAL ARTERY;  Surgeon: Margherita Shell, MD;  Location: [facility] OR;  Service: Vascular;  Laterality: Bilateral;   FEMORAL-POPLITEAL BYPASS GRAFT     INTRAMEDULLARY (IM) NAIL INTERTROCHANTERIC Left 04/03/2021   Procedure: LEFT  HIP INTRAMEDULLARY (IM) NAIL  INTERTROCHANTRIC;  Surgeon: Arnie Lao, MD;  Location: [facility];  Service: Orthopedics;  Laterality: Left;   IR FLUORO GUIDED NEEDLE PLC ASPIRATION/INJECTION LOC  12/26/2023   MALONEY DILATION N/A 05/16/2023   Procedure: MALONEY DILATION;  Surgeon: Suzette Espy, MD;  [facility];  Service: Endoscopy;  Laterality: N/A;   PERIPHERAL VASCULAR BALLOON ANGIOPLASTY Right 09/22/2021   Procedure: PERIPHERAL VASCULAR BALLOON ANGIOPLASTY;  Surgeon: Young Hensen, MD;  [facility];  Service: Cardiovascular;  Laterality: Right;   PERIPHERAL VASCULAR CATHETERIZATION N/A 05/08/2016   Procedure: Lower Extremity Angiography;  Surgeon: Avanell Leigh, MD;  Location: [facility] INVASIVE CV LAB;  Service: Cardiovascular;  Laterality: N/A;   PERIPHERAL VASCULAR INTERVENTION Right 09/19/2016   Procedure: Peripheral Vascular Intervention;  Surgeon: Margherita Shell, MD;  [facility];  Service: Cardiovascular;  Laterality: Right;  ext iliac stent   PERIPHERAL VASCULAR INTERVENTION Right 09/22/2021   Procedure: PERIPHERAL VASCULAR INTERVENTION;  Surgeon: Young Hensen, MD;  [facility];  Service: Cardiovascular;  Laterality: Right;   POLYPECTOMY  02/19/2018   Procedure: POLYPECTOMY;  Surgeon: Suzette Espy, MD;  [facility];  Service: Endoscopy;;   PROCTOSCOPY  10/24/2017   Procedure: PROCTOSCOPY;  Surgeon: Suzette Espy, MD;  [facility];  Service: Endoscopy;;   TEE WITHOUT CARDIOVERSION N/A 10/18/2015   Procedure: TRANSESOPHAGEAL ECHOCARDIOGRAM (TEE);  Surgeon: Zelphia Higashi, MD;  Location[facility] OR;  Service: Open Heart Surgery;  Laterality: N/A;    Prior to Admission medications   Medication Sig Start Date End Date Taking? Authorizing Provider  amLODipine  (NORVASC ) 10 MG tablet Take 10 mg by mouth in the morning.   Yes [provider]  amoxicillin -clavulanate (AUGMENTIN ) 875-125 MG tablet  Take 1 tablet by mouth 2 (two) times daily. 12/25/23  Yes [provider]  aspirin  EC 81 MG tablet Take 1 tablet (81 mg total) by mouth daily with breakfast. 05/23/19  Yes Emokpae, Courage, MD  atorvastatin  (LIPITOR ) 40 MG tablet Take 1 tablet (40 mg total) by mouth at bedtime. 12/11/23  Yes Johnson, Clanford L, MD  baclofen (LIORESAL) 10 MG tablet Take 10 mg by mouth at bedtime. 12/05/23  Yes [provider]  cilostazol  (PLETAL ) 100 MG tablet Take 100 mg by mouth 2 (two) times daily.   Yes [provider]  clopidogrel  (PLAVIX ) 75 MG tablet Take 75 mg by mouth in the morning.   Yes [provider]  empagliflozin  (JARDIANCE ) 25 MG TABS tablet 12.5 mg daily as directed 12/11/23  Yes Johnson, Clanford L, MD  gabapentin  (NEURONTIN ) 600 MG tablet Take 600 mg by mouth 2 (two) times daily.   Yes [provider]  insulin  glargine (LANTUS ) 100 UNIT/ML injection Inject 0.1 mLs (10 Units total) into the skin at bedtime. Patient taking differently: Inject 20 Units into the skin at bedtime. 12/11/23  Yes Johnson, Clanford L, MD  losartan  (COZAAR ) 50 MG tablet Take 50 mg by mouth daily. 03/02/22  Yes [provider]  metoprolol  tartrate (LOPRESSOR ) 50 MG tablet Take 25 mg by mouth 2 (two) times daily.    Yes [provider]  pantoprazole  (PROTONIX ) 40 MG tablet Take 1 tablet (40 mg total) by mouth daily. 12/11/23  Yes Johnson, Clanford L, MD  traMADol  (ULTRAM ) 50 MG tablet Take 0.5 tablets (25 mg total) by mouth every 12 (twelve) hours as needed for severe pain (pain score 7-10). 12/11/23 12/10/24 Yes Johnson, Clanford L, MD  vitamin B-12 (CYANOCOBALAMIN ) 1000 MCG tablet Take 1,000 mcg by mouth in the morning.   Yes [provider]  doxycycline  (VIBRA -TABS) 100 MG tablet Take 100 mg by mouth 2 (two) times daily. Patient not taking: Reported on 12/28/2023 12/19/23   [provider]    Current Facility-Administered Medications  Medication Dose  Route Frequency Provider Last Rate Last Admin   acetaminophen  (TYLENOL ) tablet 650 mg  650 mg Oral Q6H PRN Tat, Myrtie Atkinson, MD       Or   acetaminophen  (TYLENOL ) suppository 650 mg  650 mg Rectal Q6H PRN Tat, Myrtie Atkinson, MD       acetaminophen  (TYLENOL ) tablet 1,000 mg  1,000 mg Oral Q8H Tat, Myrtie Atkinson, MD   1,000 mg at 12/31/23 0537   albuterol  (PROVENTIL ) (2.5 MG/3ML) 0.083% nebulizer solution 2.5 mg  2.5 mg Nebulization Q4H PRN Tat, Myrtie Atkinson, MD       albuterol  (PROVENTIL ) (2.5 MG/3ML) 0.083% nebulizer solution 2.5 mg  2.5 mg Nebulization Q8H Tat, Myrtie Atkinson, MD   2.5 mg at 12/31/23 1610   arformoterol  (BROVANA ) nebulizer solution 15 mcg  15 mcg Nebulization BID Demaris Fillers, MD   15 mcg at 12/31/23 9604   atorvastatin  (LIPITOR ) tablet 40 mg  40 mg Oral QHS Tat, David, MD   40 mg at 12/30/23  2219   azithromycin  (ZITHROMAX ) tablet 500 mg  500 mg Oral Daily Tat, David, MD   500 mg at 12/31/23 0840   budesonide  (PULMICORT ) nebulizer solution 0.5 mg  0.5 mg Nebulization BID Tat, David, MD   0.5 mg at 12/31/23 0865   ceFEPIme  (MAXIPIME ) 2 g in sodium chloride  0.9 % 100 mL IVPB  2 g Intravenous Q24H Madueme, Elvira C, RPH 200 mL/hr at 12/30/23 0951 2 g at 12/30/23 0951   chlorpheniramine-HYDROcodone  (TUSSIONEX) 10-8 MG/5ML suspension 5 mL  5 mL Oral Q12H Tat, David, MD   5 mL at 12/31/23 0845   cilostazol  (PLETAL ) tablet 100 mg  100 mg Oral BID Tat, Myrtie Atkinson, MD   100 mg at 12/31/23 0840   cyanocobalamin  (VITAMIN B12) tablet 1,000 mcg  1,000 mcg Oral q AM Tat, Myrtie Atkinson, MD   1,000 mcg at 12/31/23 7846   gabapentin  (NEURONTIN ) capsule 300 mg  300 mg Oral BID Tat, Myrtie Atkinson, MD   300 mg at 12/31/23 0840   hydrOXYzine  (ATARAX ) tablet 25 mg  25 mg Oral TID PRN Demaris Fillers, MD       insulin  aspart (novoLOG ) injection 0-5 Units  0-5 Units Subcutaneous QHS Demaris Fillers, MD   4 Units at 12/30/23 2212   insulin  aspart (novoLOG ) injection 0-9 Units  0-9 Units Subcutaneous TID WC Demaris Fillers, MD   5 Units at 12/31/23 0845   insulin  glargine-yfgn  (SEMGLEE ) injection 5 Units  5 Units Subcutaneous QHS Demaris Fillers, MD   5 Units at 12/30/23 2212   methylPREDNISolone  sodium succinate (SOLU-MEDROL ) 40 mg/mL injection 40 mg  40 mg Intravenous Q12H Tat, David, MD   40 mg at 12/31/23 0845   metoprolol  tartrate (LOPRESSOR ) tablet 25 mg  25 mg Oral BID Tat, Myrtie Atkinson, MD   25 mg at 12/31/23 0840   ondansetron  (ZOFRAN ) tablet 4 mg  4 mg Oral Q6H PRN Tat, Myrtie Atkinson, MD       Or   ondansetron  (ZOFRAN ) injection 4 mg  4 mg Intravenous Q6H PRN Tat, Myrtie Atkinson, MD   4 mg at 12/31/23 0947   pantoprazole  (PROTONIX ) injection 40 mg  40 mg Intravenous Q12H TatMyrtie Atkinson, MD       revefenacin  (YUPELRI ) nebulizer solution 175 mcg  175 mcg Nebulization Daily Tat, Myrtie Atkinson, MD   175 mcg at 12/31/23 0720   traMADol  (ULTRAM ) tablet 50 mg  50 mg Oral Q12H PRN Demaris Fillers, MD        Allergies as of 12/28/2023 - Review Complete 12/28/2023  Allergen Reaction Noted   Acarbose Diarrhea 11/13/2012    Family History  Problem Relation Age of Onset   Heart attack Mother    Stroke Mother    Colon cancer Brother        late 22s    Social History   Socioeconomic History   Marital status: Married    Spouse name: Not on file   Number of children: Not on file   Years of education: Not on file   Highest education level: Not on file  Occupational History   Not on file  Tobacco Use   Smoking status: Former    Current packs/day: 0.00    Average packs/day: 2.0 packs/day for 30.0 years (60.0 ttl pk-yrs)    Types: Cigarettes    Start date: 08/14/1970    Quit date: 08/14/2000    Years since quitting: 23.3   Smokeless tobacco: Never  Vaping Use   Vaping status: Never Used  Substance and Sexual Activity  Alcohol  use: No   Drug use: No   Sexual activity: Not Currently  Other Topics Concern   Not on file  Social History Narrative   Not on file   Social Drivers of Health   Financial Resource Strain: Low Risk  (10/08/2023)   Received from [facility]   Overall Financial  Resource Strain (CARDIA)    Difficulty of Paying Living Expenses: Not very hard  Food Insecurity: No Food Insecurity (12/28/2023)   Hunger Vital Sign    Worried About Running Out of Food in the Last Year: Never true    Ran Out of Food in the Last Year: Never true  Recent Concern: Food Insecurity - Food Insecurity Present (12/18/2023)   Hunger Vital Sign    Worried About Running Out of Food in the Last Year: Sometimes true    Ran Out of Food in the Last Year: Sometimes true  Transportation Needs: No Transportation Needs (12/28/2023)   PRAPARE - Administrator, Civil Service (Medical): No    Lack of Transportation (Non-Medical): No  Physical Activity: Not on file  Stress: Not on file  Social Connections: Socially Integrated (12/28/2023)   Social Connection and Isolation Panel [NHANES]    Frequency of Communication with Friends and Family: More than three times a week    Frequency of Social Gatherings with Friends and Family: Three times a week    Attends Religious Services: More than 4 times per year    Active Member of Clubs or Organizations: Yes    Attends Banker Meetings: 1 to 4 times per year    Marital Status: Married  Catering manager Violence: Not At Risk (12/28/2023)   Humiliation, Afraid, Rape, and Kick questionnaire    Fear of Current or Ex-Partner: No    Emotionally Abused: No    Physically Abused: No    Sexually Abused: No     Review of Systems   Gen: Denies any fever, chills, loss of appetite, change in weight or weight loss CV: Denies chest pain, heart palpitations, syncope, edema  Resp: Denies shortness of breath with rest, cough, wheezing, coughing up blood, and pleurisy. GI: seeHPI GU : Denies urinary burning, blood in urine, urinary frequency, and urinary incontinence. MS: Denies joint pain, limitation of movement, swelling, cramps, and atrophy.  Derm: Denies rash, itching, dry skin, hives. Psych: Denies depression, anxiety, memory loss,  hallucinations, and confusion. Heme: Denies bruising or bleeding Neuro:  Denies any headaches, dizziness, paresthesias, shaking  Physical Exam   Vital Signs in last 24 hours: Temp:  [97.1 F (36.2 C)-98 F (36.7 C)] 97.9 F (36.6 C) (05/19 1000) Pulse Rate:  [100-105] 105 (05/19 1000) Resp:  [16-20] 20 (05/19 1000) BP: (147-158)/(61-65) 157/62 (05/19 1000) SpO2:  [94 %-100 %] 98 % (05/19 0727) Last BM Date : 12/28/23  General:   Alert,  Well-developed, well-nourished, pleasant and cooperative in NAD Head:  Normocephalic and atraumatic. Eyes:  Sclera clear, no icterus.   Conjunctiva pink. Ears:  mild hard of hearing Lungs:  coarse, scattered rhonchi, no distress Heart:  S1 S2 present without obvious murmur Abdomen:  Soft, nontender and nondistended. Limited exam as patient sitting up in chair Rectal: deferred   Msk:  Symmetrical without gross deformities. Normal posture. Extremities:  Without edema. Neurologic:  Alert and  oriented x4.   Intake/Output from previous day: 05/18 0701 - 05/19 0700 In: 1470.4 [P.O.:240; I.V.:1230.4] Out: -  Intake/Output this shift: No intake/output data recorded.  @WEIGHTS @  Labs/Studies   Recent Labs Recent Labs    12/29/23 0352 12/30/23 0347 12/31/23 0811  WBC 9.4 11.2* 15.2*  HGB 9.2* 8.1* 8.2*  HCT 30.1* 26.6* 26.0*  PLT 458* 381 580*   BMET Recent Labs    12/29/23 0352 12/30/23 0347 12/31/23 0413  NA 135 134* 134*  K 3.6 4.0 4.5  CL 104 106 105  CO2 20* 20* 22  GLUCOSE 138* 101* 239*  BUN 38* 38* 57*  CREATININE 2.85* 2.55* 2.34*  CALCIUM  8.7* 8.4* 9.1      Assessment   [name] is a 78 y.o. year old male with multimorbidities including CAD s/p CABG, HTN, DM, HLD, CKD, PVD, on Plavix  and aspirin  as outpatient, metastatic bone lesions with evaluation underway by Dr. Cheree Cords for primary source, GERD with Grade D reflux esophagitis, complicated diverticulitis s/p diverting colostomy May 2019 and reversal Aug  2019, history of adenomas with colonoscopy up-to-date (Oct 2024), admitted with pneumonia, COPD exacerbation, acute on chronic renal failure, and now GI consulted due to acute vomiting this morning with coffee-ground emesis.   Coffee ground emesis: suspect secondary to known Grade D esophagitis (EGD Oct 2024), unable to rule out gastritis/PUD, MW tear. Several episodes this morning acutely and now resolved with improved symptoms. No melena. Plavix  received this morning but now on hold, along with 81 mg aspirin . Chronic anemia noted, with Hgb 8.2 today, similar to yesterday but is down from Hgb 10.7 on admission. Suspect some is dilutional, but we will recheck H/H at 1500 today. Agree with IV BID PPI. Patient and wife are concerned and desire EGD prior to discharge. Suspect he will be appropriate for tomorrow, as he feels he is back to his baseline respiratory status.     Plan / Recommendations    H/H 1500 Agree with IV BID PPI Agree with holding subcutaneous heparin , plavix , and 81 mg aspirin  for now NPO after midnight As long as stable from respiratory standpoint tomorrow, can pursue diagnostic EGD by Dr. Alita Irwin.  Will reassess in the morning.      12/31/2023, 10:38 AM  Delman Ferns, PhD, ANP-BC [facility]

## 2024-01-01 ENCOUNTER — Inpatient Hospital Stay (HOSPITAL_COMMUNITY)

## 2024-01-01 ENCOUNTER — Encounter (HOSPITAL_COMMUNITY)
Admission: EM | Disposition: A | Discharge status: Home / Self Care | Payer: Self-pay | Source: Home / Self Care | Attending: Internal Medicine

## 2024-01-01 ENCOUNTER — Encounter (HOSPITAL_COMMUNITY): Payer: Self-pay | Admitting: Internal Medicine

## 2024-01-01 DIAGNOSIS — Z87891 Personal history of nicotine dependence: Secondary | ICD-10-CM

## 2024-01-01 DIAGNOSIS — I25119 Atherosclerotic heart disease of native coronary artery with unspecified angina pectoris: Secondary | ICD-10-CM

## 2024-01-01 DIAGNOSIS — K920 Hematemesis: Secondary | ICD-10-CM | POA: Diagnosis not present

## 2024-01-01 DIAGNOSIS — C7951 Secondary malignant neoplasm of bone: Secondary | ICD-10-CM | POA: Diagnosis not present

## 2024-01-01 DIAGNOSIS — J9601 Acute respiratory failure with hypoxia: Secondary | ICD-10-CM

## 2024-01-01 DIAGNOSIS — K209 Esophagitis, unspecified without bleeding: Secondary | ICD-10-CM

## 2024-01-01 DIAGNOSIS — M898X9 Other specified disorders of bone, unspecified site: Secondary | ICD-10-CM | POA: Diagnosis not present

## 2024-01-01 DIAGNOSIS — K3189 Other diseases of stomach and duodenum: Secondary | ICD-10-CM | POA: Diagnosis not present

## 2024-01-01 DIAGNOSIS — E1165 Type 2 diabetes mellitus with hyperglycemia: Secondary | ICD-10-CM | POA: Diagnosis not present

## 2024-01-01 DIAGNOSIS — N179 Acute kidney failure, unspecified: Secondary | ICD-10-CM | POA: Diagnosis not present

## 2024-01-01 DIAGNOSIS — K2100 Gastro-esophageal reflux disease with esophagitis, without bleeding: Secondary | ICD-10-CM | POA: Diagnosis not present

## 2024-01-01 DIAGNOSIS — K2970 Gastritis, unspecified, without bleeding: Secondary | ICD-10-CM

## 2024-01-01 DIAGNOSIS — J181 Lobar pneumonia, unspecified organism: Secondary | ICD-10-CM | POA: Diagnosis not present

## 2024-01-01 HISTORY — PX: ESOPHAGOGASTRODUODENOSCOPY: SHX5428

## 2024-01-01 LAB — COMPREHENSIVE METABOLIC PANEL WITH GFR
ALT: 10 U/L (ref 0–44)
AST: 16 U/L (ref 15–41)
Albumin: 2.8 g/dL — ABNORMAL LOW (ref 3.5–5.0)
Alkaline Phosphatase: 63 U/L (ref 38–126)
Anion gap: 6 (ref 5–15)
BUN: 72 mg/dL — ABNORMAL HIGH (ref 8–23)
CO2: 21 mmol/L — ABNORMAL LOW (ref 22–32)
Calcium: 8.7 mg/dL — ABNORMAL LOW (ref 8.9–10.3)
Chloride: 108 mmol/L (ref 98–111)
Creatinine, Ser: 2.43 mg/dL — ABNORMAL HIGH (ref 0.61–1.24)
GFR, Estimated: 27 mL/min — ABNORMAL LOW (ref >60)
Glucose, Bld: 189 mg/dL — ABNORMAL HIGH (ref 70–99)
Potassium: 4.8 mmol/L (ref 3.5–5.1)
Sodium: 135 mmol/L (ref 135–145)
Total Bilirubin: 0.8 mg/dL (ref 0.0–1.2)
Total Protein: 6 g/dL — ABNORMAL LOW (ref 6.5–8.1)

## 2024-01-01 LAB — TYPE AND SCREEN
ABO/RH(D): A POS
Antibody Screen: NEGATIVE
Unit division: 0
Unit division: 0

## 2024-01-01 LAB — GLUCOSE, CAPILLARY
Glucose-Capillary: 233 mg/dL — ABNORMAL HIGH (ref 70–99)
Glucose-Capillary: 246 mg/dL — ABNORMAL HIGH (ref 70–99)
Glucose-Capillary: 249 mg/dL — ABNORMAL HIGH (ref 70–99)
Glucose-Capillary: 236 mg/dL — ABNORMAL HIGH (ref 70–99)
Glucose-Capillary: 271 mg/dL — ABNORMAL HIGH (ref 70–99)

## 2024-01-01 LAB — CBC
HCT: 24.6 % — ABNORMAL LOW (ref 39.0–52.0)
Hemoglobin: 8 g/dL — ABNORMAL LOW (ref 13.0–17.0)
MCH: 26.9 pg (ref 26.0–34.0)
MCHC: 32.5 g/dL (ref 30.0–36.0)
MCV: 82.8 fL (ref 80.0–100.0)
Platelets: 401 10*3/uL — ABNORMAL HIGH (ref 150–400)
RBC: 2.97 MIL/uL — ABNORMAL LOW (ref 4.22–5.81)
RDW: 16 % — ABNORMAL HIGH (ref 11.5–15.5)
WBC: 13.9 10*3/uL — ABNORMAL HIGH (ref 4.0–10.5)
nRBC: 0 % (ref 0.0–0.2)

## 2024-01-01 LAB — BPAM RBC
Blood Product Expiration Date: 202506062359
Blood Product Expiration Date: 202506082359
ISSUE DATE / TIME: 202505191844
ISSUE DATE / TIME: 202505192235
Unit Type and Rh: 6200
Unit Type and Rh: 6200

## 2024-01-01 LAB — APTT: aPTT: 29 s (ref 24–36)

## 2024-01-01 LAB — CULTURE, RESPIRATORY W GRAM STAIN: Culture: NORMAL

## 2024-01-01 LAB — PROTIME-INR
INR: 1.1 (ref 0.8–1.2)
Prothrombin Time: 14.9 s (ref 11.4–15.2)

## 2024-01-01 LAB — MAGNESIUM: Magnesium: 2.2 mg/dL (ref 1.7–2.4)

## 2024-01-01 LAB — PHOSPHORUS: Phosphorus: 3.9 mg/dL (ref 2.5–4.6)

## 2024-01-01 SURGERY — EGD (ESOPHAGOGASTRODUODENOSCOPY)
Anesthesia: General

## 2024-01-01 MED ORDER — LIDOCAINE 2% (20 MG/ML) 5 ML SYRINGE
INTRAMUSCULAR | Status: AC
  Filled 2024-01-01: qty 5

## 2024-01-01 MED ORDER — PROPOFOL 10 MG/ML IV BOLUS
INTRAVENOUS | Status: DC | PRN
  Administered 2024-01-01: 70 mg via INTRAVENOUS
  Administered 2024-01-01: 40 mg via INTRAVENOUS

## 2024-01-01 MED ORDER — PROPOFOL 500 MG/50ML IV EMUL
INTRAVENOUS | Status: AC
  Filled 2024-01-01: qty 50

## 2024-01-01 MED ORDER — HEPARIN (PORCINE) 25000 UT/250ML-% IV SOLN
750.0000 [IU]/h | INTRAVENOUS | Status: DC
  Administered 2024-01-01: 800 [IU]/h via INTRAVENOUS
  Filled 2024-01-01: qty 250

## 2024-01-01 MED ORDER — LACTATED RINGERS IV SOLN: INTRAVENOUS | Status: DC

## 2024-01-01 MED ORDER — PROPOFOL 500 MG/50ML IV EMUL
INTRAVENOUS | Status: DC | PRN
  Administered 2024-01-01: 125 ug/kg/min via INTRAVENOUS

## 2024-01-01 MED ORDER — IPRATROPIUM-ALBUTEROL 0.5-2.5 (3) MG/3ML IN SOLN
3.0000 mL | Freq: Three times a day (TID) | RESPIRATORY_TRACT | Status: DC
  Administered 2024-01-01 – 2024-01-03 (×5): 3 mL via RESPIRATORY_TRACT
  Filled 2024-01-01 (×5): qty 3

## 2024-01-01 MED ORDER — LACTATED RINGERS IV SOLN: INTRAVENOUS | Status: DC | PRN

## 2024-01-01 MED ORDER — GLYCOPYRROLATE PF 0.2 MG/ML IJ SOSY
PREFILLED_SYRINGE | INTRAMUSCULAR | Status: DC | PRN
  Administered 2024-01-01 (×2): .1 mg via INTRAVENOUS

## 2024-01-01 MED ORDER — PHENYLEPHRINE 80 MCG/ML (10ML) SYRINGE FOR IV PUSH (FOR BLOOD PRESSURE SUPPORT)
PREFILLED_SYRINGE | INTRAVENOUS | Status: DC | PRN
Start: 2024-01-01 — End: 2024-01-01
  Administered 2024-01-01: 160 ug via INTRAVENOUS

## 2024-01-01 MED ORDER — LIDOCAINE 2% (20 MG/ML) 5 ML SYRINGE
INTRAMUSCULAR | Status: DC | PRN
  Administered 2024-01-01: 60 mg via INTRAVENOUS

## 2024-01-01 MED ORDER — HEPARIN BOLUS VIA INFUSION
2000.0000 [IU] | Freq: Once | INTRAVENOUS | Status: AC
  Administered 2024-01-01: 2000 [IU] via INTRAVENOUS
  Filled 2024-01-01: qty 2000

## 2024-01-01 NOTE — Anesthesia Postprocedure Evaluation (Signed)
 Anesthesia Post Note  Patient: [name]  Procedure(s) Performed: EGD (ESOPHAGOGASTRODUODENOSCOPY)  Patient location during evaluation: PACU Anesthesia Type: General Level of consciousness: awake and alert Pain management: pain level controlled Vital Signs Assessment: post-procedure vital signs reviewed and stable Respiratory status: spontaneous breathing, nonlabored ventilation, respiratory function stable and patient connected to nasal cannula oxygen Cardiovascular status: stable and blood pressure returned to baseline Postop Assessment: no apparent nausea or vomiting Anesthetic complications: no  No notable events documented.   Last Vitals:  Vitals:   01/01/24 1346 01/01/24 1517  BP: (!) 161/72 (!) 109/40  Pulse:  (!) 118  Resp:  15  Temp:  36.9 C  SpO2: 98% 99%    Last Pain:  Vitals:   01/01/24 1517  TempSrc:   PainSc: Asleep                 Beacher Limerick

## 2024-01-01 NOTE — Plan of Care (Signed)

## 2024-01-01 NOTE — Progress Notes (Signed)
 MEWS Progress Note  Patient Details Name: [name] MRN: [id number] DOB: [birth year] Today's Date:12/31/2023   MEWS Flowsheet Documentation:  Assess: MEWS Score Temp: 97.8 F (36.6 C) BP: (!) 158/70 MAP (mmHg): 92 Pulse Rate: (!) 101 ECG Heart Rate: 86 Resp: 19 Level of Consciousness: Alert SpO2: 96 % O2 Device: Nasal Cannula O2 Flow Rate (L/min): 2 L/min Assess: MEWS Score MEWS Temp: 0 MEWS Systolic: 0 MEWS Pulse: 1 MEWS RR: 0 MEWS LOC: 0 MEWS Score: 1 MEWS Score Color: Green Assess: SIRS CRITERIA SIRS Temperature : 0 SIRS Respirations : 0 SIRS Pulse: 1 SIRS WBC: 0 SIRS Score Sum : 1 SIRS Temperature : 0 SIRS Pulse: 1 SIRS Respirations : 0 SIRS WBC: 0 SIRS Score Sum : 1 Assess: if the MEWS score is Yellow or Red Were vital signs accurate and taken at a resting state?: Yes Does the patient meet 2 or more of the SIRS criteria?: No MEWS guidelines implemented : Yes, yellow Treat MEWS Interventions: Considered administering scheduled or prn medications/treatments as ordered Take Vital Signs Increase Vital Sign Frequency : Yellow: Q2hr x1, continue Q4hrs until patient remains green for 12hrs Escalate MEWS: Escalate: Yellow: Discuss with charge nurse and consider notifying provider and/or RRT   Ramonica Grigg Evone Hoh

## 2024-01-01 NOTE — Consult Note (Signed)
 [facility] Consultation Oncology  Name: [name]      MRN: [id number]    Location: A317/A317-01  Date: 01/01/2024 Time:4:27 PM   REFERRING PHYSICIAN: Dr. Winferd Hatter  REASON FOR CONSULT: To follow-up on biopsy and PET scan   DIAGNOSIS: Metastatic lytic bone lesions  HISTORY OF PRESENT ILLNESS: [name] is a 78 year old male known to me from office visit for lytic bone lesions.  He underwent biopsy of the T9 vertebral body by IR on 12/26/2023.  He is admitted to the hospital with lobar pneumonia and is receiving cefepime  and azithromycin .  He also had coffee grounds emesis had EGD done today which showed LA grade C esophagitis with no bleeding which was biopsied.  Gastritis was also biopsied.  Normal duodenum.  He is still slightly drowsy as he just got back from procedure.  His daughter and wife are at bedside.  PAST MEDICAL HISTORY:   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   x 5   Hypercholesteremia    Hypertension    Neuropathy    Peripheral vascular disease (HCC)    s/p R-L FEM-FEM BYPASS   Pneumonia    Tachycardia    after CABG, pt on medicine for this    ALLERGIES: Allergies  Allergen Reactions   Acarbose Diarrhea      MEDICATIONS: I have reviewed the patient's current medications.     PAST SURGICAL HISTORY Past Surgical History:  Procedure Laterality Date   ABDOMINAL AORTOGRAM Bilateral 09/19/2016   Procedure: iliac;  Surgeon: Margherita Shell, MD;  [facility];  Service: Cardiovascular;  Laterality: Bilateral;   ABDOMINAL AORTOGRAM W/LOWER EXTREMITY N/A 09/22/2021   Procedure: ABDOMINAL AORTOGRAM W/LOWER EXTREMITY;   Surgeon: Young Hensen, MD;  [facility];  Service: Cardiovascular;  Laterality: N/A;   ABDOMINAL AORTOGRAM W/LOWER EXTREMITY N/A 10/05/2022   Procedure: ABDOMINAL AORTOGRAM W/LOWER EXTREMITY;  Surgeon: Young Hensen, MD;  [facility];  Service: Cardiovascular;  Laterality: N/A;   BIOPSY  05/16/2023   Procedure: BIOPSY;  Surgeon: Suzette Espy, MD;  [facility];  Service: Endoscopy;;   CARDIAC CATHETERIZATION  2003   with stent   CARDIAC CATHETERIZATION N/A 10/11/2015   Procedure: Left Heart Cath and Coronary Angiography;  Surgeon: Peter M Swaziland, MD;  Location: [facility] INVASIVE CV LAB;  Service: Cardiovascular;  Laterality: N/A;   COLON RESECTION N/A 12/13/2017   Procedure: EXPLORATORY LAPAROTOMY, SIGMOID COLECTOMY WITH COLOSTOMY;  Surgeon: Dareen Ebbing, MD;  Location: [facility] OR;  Service: General;  Laterality: N/A;   COLONOSCOPY N/A 09/22/2013   Procedure: COLONOSCOPY;  Surgeon: Suzette Espy, MD;  [facility];  Service: Endoscopy;  Laterality: N/A;  9:30 AM   COLONOSCOPY N/A 02/19/2018   Procedure: COLONOSCOPY;  Surgeon: Suzette Espy, MD;  [facility];  Service: Endoscopy;  Laterality: N/A;  2:00pm   COLONOSCOPY WITH PROPOFOL  N/A 05/16/2023   Procedure: COLONOSCOPY WITH PROPOFOL ;  Surgeon: Suzette Espy, MD;  [facility];  Service: Endoscopy;  Laterality: N/A;  730am, asa 3   COLOSTOMY N/A 12/13/2017  Procedure: COLOSTOMY;  Surgeon: Dareen Ebbing, MD;  Location: [facility] OR;  Service: General;  Laterality: N/A;   COLOSTOMY REVERSAL N/A 03/26/2018   Procedure: COLOSTOMY REVERSAL;  Surgeon: Dareen Ebbing, MD;  [facility];  Service: General;  Laterality: N/A;   CORONARY ARTERY BYPASS GRAFT N/A 10/18/2015   Procedure: CORONARY ARTERY BYPASS GRAFTING (CABG) x  five, using left internal mammary artery and right leg greater saphenous vein harvested endoscopically;  Surgeon: Zelphia Higashi, MD;   [facility];  Service: Open Heart Surgery;  Laterality: N/A;   ENDARTERECTOMY FEMORAL Right 08/09/2016   Procedure: ENDARTERECTOMY FEMORAL WITH VEIN PATCH ANGIOPLASTY;  Surgeon: Margherita Shell, MD;  [facility];  Service: Vascular;  Laterality: Right;   ESOPHAGOGASTRODUODENOSCOPY N/A 10/24/2017   Dr. Riley Cheadle: hiatal hernia   ESOPHAGOGASTRODUODENOSCOPY (EGD) WITH PROPOFOL  N/A 05/16/2023   Procedure: ESOPHAGOGASTRODUODENOSCOPY (EGD) WITH PROPOFOL ;  Surgeon: Suzette Espy, MD;  [facility];  Service: Endoscopy;  Laterality: N/A;   FEMORAL-FEMORAL BYPASS GRAFT Bilateral 08/09/2016   Procedure: REVISION BYPASS GRAFT RIGHT FEMORAL-LEFT FEMORAL ARTERY;  Surgeon: Margherita Shell, MD;  Location: [facility] OR;  Service: Vascular;  Laterality: Bilateral;   FEMORAL-POPLITEAL BYPASS GRAFT     INTRAMEDULLARY (IM) NAIL INTERTROCHANTERIC Left 04/03/2021   Procedure: LEFT  HIP INTRAMEDULLARY (IM) NAIL INTERTROCHANTRIC;  Surgeon: Arnie Lao, MD;  [facility];  Service: Orthopedics;  Laterality: Left;   IR FLUORO GUIDED NEEDLE PLC ASPIRATION/INJECTION LOC  12/26/2023   MALONEY DILATION N/A 05/16/2023   Procedure: MALONEY DILATION;  Surgeon: Suzette Espy, MD;  [facility];  Service: Endoscopy;  Laterality: N/A;   PERIPHERAL VASCULAR BALLOON ANGIOPLASTY Right 09/22/2021   Procedure: PERIPHERAL VASCULAR BALLOON ANGIOPLASTY;  Surgeon: Young Hensen, MD;  [facility];  Service: Cardiovascular;  Laterality: Right;   PERIPHERAL VASCULAR CATHETERIZATION N/A 05/08/2016   Procedure: Lower Extremity Angiography;  Surgeon: Avanell Leigh, MD;  Location[facility] INVASIVE CV LAB;  Service: Cardiovascular;  Laterality: N/A;   PERIPHERAL VASCULAR INTERVENTION Right 09/19/2016   Procedure: Peripheral Vascular Intervention;  Surgeon: Margherita Shell, MD;  [facility];  Service: Cardiovascular;  Laterality: Right;  ext iliac stent   PERIPHERAL  VASCULAR INTERVENTION Right 09/22/2021   Procedure: PERIPHERAL VASCULAR INTERVENTION;  Surgeon: Young Hensen, MD;  [facility];  Service: Cardiovascular;  Laterality: Right;   POLYPECTOMY  02/19/2018   Procedure: POLYPECTOMY;  Surgeon: Suzette Espy, MD;  [facility];  Service: Endoscopy;;   PROCTOSCOPY  10/24/2017   Procedure: PROCTOSCOPY;  Surgeon: Suzette Espy, MD;  [facility];  Service: Endoscopy;;   TEE WITHOUT CARDIOVERSION N/A 10/18/2015   Procedure: TRANSESOPHAGEAL ECHOCARDIOGRAM (TEE);  Surgeon: Zelphia Higashi, MD;  Location: [facility] OR;  Service: Open Heart Surgery;  Laterality: N/A;    FAMILY HISTORY: Family History  Problem Relation Age of Onset   Heart attack Mother    Stroke Mother    Colon cancer Brother        late 61s    SOCIAL HISTORY:  reports that he quit smoking about 23 years ago. His smoking use included cigarettes. He started smoking about 53 years ago. He has a 60 pack-year smoking history. He has never used smokeless tobacco. He reports that he does not drink alcohol  and does not use drugs.  PERFORMANCE STATUS: The patient's performance status is 1 - Symptomatic but completely ambulatory  PHYSICAL EXAM: Most Recent Vital  Signs: Blood pressure (!) 134/58, pulse (!) 118, temperature 97.6 F (36.4 C), resp. rate 17, height 5\' 7"  (1.702 m), weight 141 lb (64 kg), SpO2 95%. BP (!) 134/58   Pulse (!) 118   Temp 97.6 F (36.4 C)   Resp 17   Ht 5\' 7"  (1.702 m)   Wt 141 lb (64 kg)   SpO2 95%   BMI 22.08 kg/m  General appearance: alert, cooperative, and appears stated age  LABORATORY DATA:  Results for orders placed or performed during the hospital encounter of 12/28/23 (from the past 48 hours)  Glucose, capillary     Status: Abnormal   Collection Time: 12/30/23  4:34 PM  Result Value Ref Range   Glucose-Capillary 255 (H) 70 - 99 mg/dL    Comment: Glucose reference range applies only to samples taken  after fasting for at least 8 hours.  Glucose, capillary     Status: Abnormal   Collection Time: 12/30/23 10:11 PM  Result Value Ref Range   Glucose-Capillary 314 (H) 70 - 99 mg/dL    Comment: Glucose reference range applies only to samples taken after fasting for at least 8 hours.  Basic metabolic panel with GFR     Status: Abnormal   Collection Time: 12/31/23  4:13 AM  Result Value Ref Range   Sodium 134 (L) 135 - 145 mmol/L   Potassium 4.5 3.5 - 5.1 mmol/L   Chloride 105 98 - 111 mmol/L   CO2 22 22 - 32 mmol/L   Glucose, Bld 239 (H) 70 - 99 mg/dL    Comment: Glucose reference range applies only to samples taken after fasting for at least 8 hours.   BUN 57 (H) 8 - 23 mg/dL   Creatinine, Ser 1.61 (H) 0.61 - 1.24 mg/dL   Calcium  9.1 8.9 - 10.3 mg/dL   GFR, Estimated 28 (L) >60 mL/min    Comment: (NOTE) Calculated using the CKD-EPI Creatinine Equation (2021)    Anion gap 7 5 - 15    Comment: Performed at [facility], [address], [place], [place] [address]  Magnesium      Status: None   Collection Time: 12/31/23  4:13 AM  Result Value Ref Range   Magnesium  2.3 1.7 - 2.4 mg/dL    Comment: Performed at [facility], [address], [place], [place] [address]  Phosphorus     Status: None   Collection Time: 12/31/23  4:13 AM  Result Value Ref Range   Phosphorus 3.0 2.5 - 4.6 mg/dL    Comment: Performed at [facility], [address], [place], [place] [address]  Glucose, capillary     Status: Abnormal   Collection Time: 12/31/23  7:25 AM  Result Value Ref Range   Glucose-Capillary 278 (H) 70 - 99 mg/dL    Comment: Glucose reference range applies only to samples taken after fasting for at least 8 hours.  CBC     Status: Abnormal   Collection Time: 12/31/23  8:11 AM  Result Value Ref Range   WBC 15.2 (H) 4.0 - 10.5 K/uL   RBC 3.20 (L) 4.22 - 5.81 MIL/uL   Hemoglobin 8.2 (L) 13.0 - 17.0 g/dL   HCT 78.2 (L) 95.6 - 21.3 %   MCV 81.3 80.0 - 100.0 fL   MCH 25.6 (L) 26.0 - 34.0 pg    MCHC 31.5 30.0 - 36.0 g/dL   RDW 08.6 (H) 57.8 - 46.9 %   Platelets 580 (H) 150 - 400 K/uL   nRBC  0.0 0.0 - 0.2 %    Comment: Performed at [facility], [address] [place], [place] [address]  Ammonia     Status: None   Collection Time: 12/31/23  8:11 AM  Result Value Ref Range   Ammonia 13 9 - 35 umol/L    Comment: Performed at [facility], [address] [place], [place] [address]  Glucose, capillary     Status: Abnormal   Collection Time: 12/31/23 11:43 AM  Result Value Ref Range   Glucose-Capillary 314 (H) 70 - 99 mg/dL    Comment: Glucose reference range applies only to samples taken after fasting for at least 8 hours.  Hemoglobin and hematocrit, blood     Status: Abnormal   Collection Time: 12/31/23  3:24 PM  Result Value Ref Range   Hemoglobin 6.8 (LL) 13.0 - 17.0 g/dL    Comment: REPEATED TO VERIFY THIS CRITICAL RESULT HAS VERIFIED AND BEEN CALLED TO [facility] BY ANGELA WILSON ON 05 19 2025 AT 1554, AND HAS BEEN READ BACK.     HCT 21.0 (L) 39.0 - 52.0 %    Comment: Performed at [facility], [address], [place], [place] [address]  Prepare RBC (crossmatch)     Status: None   Collection Time: 12/31/23  4:00 PM  Result Value Ref Range   Order Confirmation      ORDER PROCESSED BY BLOOD BANK Performed at [facility], [address] [place], [place] [address]   Glucose, capillary     Status: Abnormal   Collection Time: 12/31/23  4:36 PM  Result Value Ref Range   Glucose-Capillary 119 (H) 70 - 99 mg/dL    Comment: Glucose reference range applies only to samples taken after fasting for at least 8 hours.   Comment 1 Notify RN    Comment 2 Document in Chart   Type and screen [facility]     Status: None   Collection Time: 12/31/23  5:52 PM  Result Value Ref Range   ABO/RH(D) A POS    Antibody Screen NEG    Sample Expiration 01/03/2024,2359    Unit Number [id number]    Blood Component Type RED CELLS,LR    Unit division 00    Status of Unit  ISSUED,FINAL    Transfusion Status OK TO TRANSFUSE    Crossmatch Result Compatible    Unit Number [id number]    Blood Component Type RED CELLS,LR    Unit division 00    Status of Unit ISSUED,FINAL    Transfusion Status OK TO TRANSFUSE    Crossmatch Result      Compatible Performed at [facility], [address] [place], [place] [address]   Glucose, capillary     Status: Abnormal   Collection Time: 12/31/23  9:03 PM  Result Value Ref Range   Glucose-Capillary 275 (H) 70 - 99 mg/dL    Comment: Glucose reference range applies only to samples taken after fasting for at least 8 hours.   Comment 1 Notify RN    Comment 2 Document in Chart   Comprehensive metabolic panel with GFR     Status: Abnormal   Collection Time: 01/01/24  4:17 AM  Result Value Ref Range   Sodium 135 135 - 145 mmol/L   Potassium 4.8 3.5 - 5.1 mmol/L   Chloride 108 98 - 111 mmol/L   CO2 21 (L) 22 - 32 mmol/L   Glucose, Bld 189 (H) 70 - 99 mg/dL    Comment: Glucose  reference range applies only to samples taken after fasting for at least 8 hours.   BUN 72 (H) 8 - 23 mg/dL   Creatinine, Ser 0.98 (H) 0.61 - 1.24 mg/dL   Calcium  8.7 (L) 8.9 - 10.3 mg/dL   Total Protein 6.0 (L) 6.5 - 8.1 g/dL   Albumin  2.8 (L) 3.5 - 5.0 g/dL   AST 16 15 - 41 U/L   ALT 10 0 - 44 U/L   Alkaline Phosphatase 63 38 - 126 U/L   Total Bilirubin 0.8 0.0 - 1.2 mg/dL   GFR, Estimated 27 (L) >60 mL/min    Comment: (NOTE) Calculated using the CKD-EPI Creatinine Equation (2021)    Anion gap 6 5 - 15    Comment: Performed at [facility], [address] [place], [place] [address]  CBC     Status: Abnormal   Collection Time: 01/01/24  4:17 AM  Result Value Ref Range   WBC 13.9 (H) 4.0 - 10.5 K/uL   RBC 2.97 (L) 4.22 - 5.81 MIL/uL   Hemoglobin 8.0 (L) 13.0 - 17.0 g/dL   HCT 78.2 (L) 95.6 - 21.3 %   MCV 82.8 80.0 - 100.0 fL   MCH 26.9 26.0 - 34.0 pg   MCHC 32.5 30.0 - 36.0 g/dL   RDW 08.6 (H) 57.8 - 46.9 %   Platelets 401 (H) 150 -  400 K/uL   nRBC 0.0 0.0 - 0.2 %    Comment: Performed at [facility], [address], [place], [place] [address]  Magnesium      Status: None   Collection Time: 01/01/24  4:17 AM  Result Value Ref Range   Magnesium  2.2 1.7 - 2.4 mg/dL    Comment: Performed at [facility], [address] [place], [place] [address]  Phosphorus     Status: None   Collection Time: 01/01/24  4:17 AM  Result Value Ref Range   Phosphorus 3.9 2.5 - 4.6 mg/dL    Comment: Performed at [facility], [address], [place], [place] [address]  Glucose, capillary     Status: Abnormal   Collection Time: 01/01/24  7:25 AM  Result Value Ref Range   Glucose-Capillary 233 (H) 70 - 99 mg/dL    Comment: Glucose reference range applies only to samples taken after fasting for at least 8 hours.  Glucose, capillary     Status: Abnormal   Collection Time: 01/01/24 11:04 AM  Result Value Ref Range   Glucose-Capillary 246 (H) 70 - 99 mg/dL    Comment: Glucose reference range applies only to samples taken after fasting for at least 8 hours.  Glucose, capillary     Status: Abnormal   Collection Time: 01/01/24  1:37 PM  Result Value Ref Range   Glucose-Capillary 249 (H) 70 - 99 mg/dL    Comment: Glucose reference range applies only to samples taken after fasting for at least 8 hours.  Glucose, capillary     Status: Abnormal   Collection Time: 01/01/24  4:19 PM  Result Value Ref Range   Glucose-Capillary 236 (H) 70 - 99 mg/dL    Comment: Glucose reference range applies only to samples taken after fasting for at least 8 hours.      RADIOGRAPHY: No results found.     PATHOLOGY: No clear evidence of malignancy.  ASSESSMENT AND PLAN:  1.  Metastatic (lytic) bone lesions: - Presentation: Right anterior chest wall pain since March 2025.  Subsequent MRI of the thoracic and lumbar spine showed multiple  enhancing lesions throughout the spine. - Subsequent workup showed PSA of 3.89, negative multiple myeloma workup.  Kappa light  chains and lambda light chains were elevated at 68.8 and 64.6 with normal ratio of 1.06.  LDH was normal.  Other tumor markers including CEA, CA 19-9 and AFP were normal. - T9 vertebral body biopsy (12/26/2023): Scant fragment of bone with evidence of bone remodeling and minimal marrow space with fibrosis and spindle cells interpreted as myofibroblastic.  Multiple levels were examined and there is scant material.  An attempt to rule out metastatic disease and possible plasma cell neoplasm (CK, AE1/AE3 and MUM1 were performed) were negative. - I reviewed PET scan (12/27/2023): Multiple scattered areas of mild asymmetric uptake, right upper hemisacrum with SUV 2.4, small sclerotic area in this location.  Upper left hemipelvis at the iliac bone with SUV 2.3, area of sclerosis.  Scattered foci along other areas of the pelvis, numerous vertebral bodies as well as ribs.  L3 vertebral body has a maximum SUV 4.3.  Other areas of uptake along some rib fractures.  No soft tissue areas. - Would recommend reaching out to IR to see if L3 vertebral body or L5 lytic lesion can be biopsied. - Discussed with patient, wife and daughter.  Will also discussed with Dr. Winferd Hatter.  All questions were answered. The patient knows to call the clinic with any problems, questions or concerns. We can certainly see the patient much sooner if necessary.    Tal Neer

## 2024-01-01 NOTE — Interval H&P Note (Signed)
 History and Physical Interval Note:  01/01/2024 2:48 PM  [name]  has presented today for surgery, with the diagnosis of coffee ground emesis.  The various methods of treatment have been discussed with the patient and family. After consideration of risks, benefits and other options for treatment, the patient has consented to  Procedure(s): EGD (ESOPHAGOGASTRODUODENOSCOPY) (N/A) as a surgical intervention.  The patient's history has been reviewed, patient examined, no change in status, stable for surgery.  I have reviewed the patient's chart and labs.  Questions were answered to the patient's satisfaction.    We will proceed with EGD as scheduled.    I thoroughly discussed with the patient the procedure, including the risks involved. Patient understands what the procedure involves including the benefits and any risks. Patient understands alternatives to the proposed procedure. Risks including (but not limited to) bleeding, tearing of the lining (perforation), rupture of adjacent organs, problems with heart and lung function, infection, and medication reactions. A small percentage of complications may require surgery, hospitalization, repeat endoscopic procedure, and/or transfusion.  Patient understood and agreed.    [name]

## 2024-01-01 NOTE — Consult Note (Addendum)
 PHARMACY - ANTICOAGULATION CONSULT NOTE  Pharmacy Consult for Heparin  Indication: VTE treatment  Allergies  Allergen Reactions   Acarbose Diarrhea   Patient Measurements: Height: 5\' 7"  (170.2 cm) Weight: 64 kg (141 lb) IBW/kg (Calculated) : 66.1 HEPARIN  DW (KG): 64  Vital Signs: Temp: 97.6 F (36.4 C) (05/20 1530) Temp Source: Oral (05/20 1339) BP: 134/58 (05/20 1530) Pulse Rate: 118 (05/20 1530)  Labs: Recent Labs    12/30/23 0347 12/31/23 0413 12/31/23 0811 12/31/23 1524 01/01/24 0417  HGB 8.1*  --  8.2* 6.8* 8.0*  HCT 26.6*  --  26.0* 21.0* 24.6*  PLT 381  --  580*  --  401*  CREATININE 2.55* 2.34*  --   --  2.43*   Estimated Creatinine Clearance: 23.4 mL/min (A) (by C-G formula based on SCr of 2.43 mg/dL (H)).  Medical History: 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   x 5   Hypercholesteremia    Hypertension    Neuropathy    Peripheral vascular disease (HCC)    s/p R-L FEM-FEM BYPASS   Pneumonia    Tachycardia    after CABG, pt on medicine for this   Medications:  No PTA anticoagulation per chart review   Assessment: [name] is a 78 yo male who presented with generalized weakness, increasing cough and poor oral intake. They have a past medical history significant for coronary artery disease status post CABG, hypertension, diabetes mellitus type 2, hyperlipidemia, CKD stage IV, diverticulitis status post colostomy and subsequent takedown, atrial tachycardia, peripheral vascular disease, TIA. Per note, patient had coffee ground emesis x 2 on 5/19. They are reported to have no further episodes today. Their Plavix  and ASA were held  with plans to restart if V/Q is negative for PE. Pharmacy has been consulted to manage this patient's heparin .   Baseline Labs Hgb 8.0 5/20 (6.8 5/19 due to bleed) Plt 401 INR/PT - ordered  aPTT - ordered  Goal of Therapy:  Heparin  level 0.3-0.7 units/ml Monitor platelets by anticoagulation protocol: Yes   Plan:  -- Give half heparin  bolus due to recent "coffee grounds emesis with low hgb the previous day" -- Give heparin  bolus 2000 unit/hr bolus  -- Start heparin  infusion at 800 units/hr -- Check heparin  levels in 8 hours -- Continue to monitor CBC and platelets while on heparin    Thank you for allowing pharmacy to be involved in this patient's care.  Craven Do, PharmD Pharmacy Resident  01/01/2024 8:06 PM

## 2024-01-01 NOTE — Anesthesia Preprocedure Evaluation (Addendum)
 Anesthesia Evaluation  Patient identified by MRN, date of birth, ID band Patient awake    Reviewed: Allergy & Precautions, H&P , NPO status , Patient's Chart, lab work & pertinent test results  Airway Mallampati: II  TM Distance: >3 FB Neck ROM: Full    Dental no notable dental hx.    Pulmonary pneumonia, Patient abstained from smoking., former smoker   Pulmonary exam normal breath sounds clear to auscultation       Cardiovascular hypertension, + angina  + CAD and + Peripheral Vascular Disease  Normal cardiovascular exam Rhythm:Regular Rate:Tachycardia  Ef 55-60% S/p CABG 2017   Neuro/Psych  Neuromuscular disease  negative psych ROS   GI/Hepatic Neg liver ROS,GERD  ,,  Endo/Other  diabetes, Insulin  Dependent    Renal/GU Renal InsufficiencyRenal diseaseStage 3  negative genitourinary   Musculoskeletal negative musculoskeletal ROS (+)    Abdominal   Peds negative pediatric ROS (+)  Hematology  (+) Blood dyscrasia, anemia   Anesthesia Other Findings   Reproductive/Obstetrics negative OB ROS                             Anesthesia Physical Anesthesia Plan  ASA: 3  Anesthesia Plan: General   Post-op Pain Management:    Induction: Intravenous  PONV Risk Score and Plan:   Airway Management Planned: Nasal Cannula  Additional Equipment:   Intra-op Plan:   Post-operative Plan:   Informed Consent: I have reviewed the patients History and Physical, chart, labs and discussed the procedure including the risks, benefits and alternatives for the proposed anesthesia with the patient or authorized representative who has indicated his/her understanding and acceptance.     Dental advisory given  Plan Discussed with: CRNA  Anesthesia Plan Comments:        Anesthesia Quick Evaluation

## 2024-01-01 NOTE — Brief Op Note (Addendum)
 Patient underwent EGD  under propofol  sedation.  Tolerated the procedure adequately.   FINDINGS:  - LA Grade C esophagitis with no bleeding. Biopsied. - Gastritis. Biopsied. - Normal duodenal bulb and second portion of the duodenum. - No evidence of active or old bleeding seen throughout the examination   RECOMMENDATIONS  Restart diet  Follow up path  Anti reflux measures  Omeprazole 40 mg BID or equivalent No absolute GI contraindication for restarted clinically indicated antiplatelet drugs while patient is closely monitored  Spoke with the patient family   Demian Maisel Faizan Damontae Loppnow, MD Gastroenterology and Hepatology [facility]

## 2024-01-01 NOTE — Op Note (Signed)
 [facility] Patient Name: [name] Procedure Date: 01/01/2024 2:32 PM MRN: [id number] Date of Birth: 1945-11-25 Attending MD: Terril Fetters , MD, [id number] CSN: [id number] Age: 78 Admit Type: Inpatient Procedure:                Upper GI endoscopy Indications:              Acute post hemorrhagic anemia, Coffee-ground emesis Providers:                Terril Fetters, MD, Karyle Pagoda, RN, Sharlette Dayhoff                            Technician, Technician Referring MD:              Medicines:                Monitored Anesthesia Care Complications:            No immediate complications. Estimated Blood Loss:     Estimated blood loss was minimal. Procedure:                Pre-Anesthesia Assessment:                           - Prior to the procedure, a History and Physical                            was performed, and patient medications and                            allergies were reviewed. The patient's tolerance of                            previous anesthesia was also reviewed. The risks                            and benefits of the procedure and the sedation                            options and risks were discussed with the patient.                            All questions were answered, and informed consent                            was obtained. Prior Anticoagulants: The patient has                            taken Plavix  (clopidogrel ), last dose was 1 day                            prior to procedure. ASA Grade Assessment: III - A                            patient with severe systemic disease. After  reviewing the risks and benefits, the patient was                            deemed in satisfactory condition to undergo the                            procedure.                           After obtaining informed consent, the endoscope was                            passed under direct vision. Throughout the                            procedure, the  patient's blood pressure, pulse, and                            oxygen saturations were monitored continuously. The                            [id number]-[id number] ([id number]) scope was introduced through the                            mouth, and advanced to the second part of duodenum.                            The upper GI endoscopy was accomplished without                            difficulty. The patient tolerated the procedure                            well. Scope In: 3:05:52 PM Scope Out: 3:12:56 PM Total Procedure Duration: 0 hours 7 minutes 4 seconds  Findings:      LA Grade C (one or more mucosal breaks continuous between tops of 2 or       more mucosal folds, less than 75% circumference) esophagitis with no       bleeding was found in the lower third of the esophagus. Biopsies were       taken with a cold forceps for histology.      Mild inflammation characterized by erosions was found in the gastric       antrum. Biopsies were taken with a cold forceps for histology.      The duodenal bulb and second portion of the duodenum were normal.      There is no endoscopic evidence of bleeding in the entire examined       stomach. Impression:               - LA Grade C esophagitis with no bleeding. Biopsied.                           - Gastritis. Biopsied.                           -  Normal duodenal bulb and second portion of the                            duodenum.                           -No evidence of active or old bleeding seen                            throughout the examination Moderate Sedation:      Per Anesthesia Care Recommendation:           Follow up path                           Anti reflux measures                           Omeprazole 40 mg BID or equavelent                           No absolute GI contraindication for restarted                            clinically indicated antiplatelet drugs while                            patient is closely monitored Procedure  Code(s):        --- Professional ---                           [phone], Esophagogastroduodenoscopy, flexible,                            transoral; with biopsy, single or multiple Diagnosis Code(s):        --- Professional ---                           K20.90, Esophagitis, unspecified without bleeding                           K29.70, Gastritis, unspecified, without bleeding                           D62, Acute posthemorrhagic anemia                           K92.0, Hematemesis CPT copyright 2022 [employer]. All rights reserved. The codes documented in this report are preliminary and upon coder review may  be revised to meet current compliance requirements. Terril Fetters, MD Terril Fetters, MD 01/01/2024 3:21:00 PM This report has been signed electronically. Number of Addenda: 0

## 2024-01-01 NOTE — Transfer of Care (Addendum)
 Immediate Anesthesia Transfer of Care Note  Patient: [name]  Procedure(s) Performed: EGD (ESOPHAGOGASTRODUODENOSCOPY)  Patient Location: PACU  Anesthesia Type:General  Level of Consciousness: drowsy and patient cooperative  Airway & Oxygen Therapy: Patient Spontanous Breathing and Patient connected to nasal cannula oxygen  Post-op Assessment: Report given to RN and Post -op Vital signs reviewed and stable  Post vital signs: Reviewed and stable  Last Vitals:  Vitals Value Taken Time  BP 109/40 01/01/24 1517  Temp 36.9 C 01/01/24 1517  Pulse 113 01/01/24 1521  Resp 17 01/01/24 1521  SpO2 99 % 01/01/24 1521  Vitals shown include unfiled device data.  Last Pain:  Vitals:   01/01/24 1500  TempSrc:   PainSc: 0-No pain         Complications: No notable events documented.

## 2024-01-01 NOTE — Progress Notes (Signed)
 PROGRESS NOTE  JAVIS ABBOUD JXB:[id number] DOB: 02-22-1946 DOA: 12/28/2023 PCP: [name]  Brief History:  78 year old male with a history of coronary artery disease status post CABG, hypertension, diabetes mellitus type 2, hyperlipidemia, CKD stage IV, 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 intake for the last several days.  He states that his coughing has worsened since 12/27/2023.  This has resulted in him having left-sided chest pain.  He states that the chest pain is elicited only when he coughs.  He denies any hemoptysis.  He does have a little worsening of his shortness of breath.  Notably, the patient was recently mated to the hospital from 12/10/2023 to 12/11/2023 secondary to syncope which was felt to be orthostatic in nature.  In addition, the patient had hospital admission from 12/07/2023 to 12/10/2023 secondary to pneumonia.  He was discharged home with levofloxacin . During his April admission for pneumonia, CT of the chest on 12/10/2023 was negative for PE but showed a pathologic fracture of T9 vertebra.  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  He has subsequently followed up with Dr. Cheree Cords.  He had a biopsy of his T9 vertebra on 12/26/2023.  Results are pending at this time.  Since his discharge from the hospital, the patient follow-up with his PCP.  The patient was started on Augmentin  and doxycycline  without improvement.  Multiple Myeloma Panel fron 12/07/23 showed elevated Alpha 2 globulin  at 1.3 and M-spike was not observed. Urine protein electrophoresis from 12/07/23 was normal.  His tumor markers including AFP, CA 19-9, and CEA were unremarkable.  His PSA was 3.89. His kappa and lambda free light chains were elevated, but kappa/lambda ratio was normal on  12/18/2023.  In the ED, the patient was afebrile hemodynamically stable with oxygen saturation 96% room air.  WBC 15.1, hemoglobin 9.9, platelets 475.  Sodium 133, potassium 4.1, bicarbonate 20, serum creatinine 3.32.  The patient was started on azithromycin  and ceftriaxone .   Assessment/Plan:  Lobar pneumonia -12/28/2023 CT chest--new heterogenous opacities LUL and LLL; continued improvement of the opacities in the right upper lobe; Redemonstration of osseous metastases, essentially similar to the prior study, including pathological fracture of T9 vertebral body and right seventh rib. - continue cefepime  and azithromycin  >>received 5 days (last day 01/01/24) - MRSA screen--neg - Check PCT <0.10 - Viral respiratory panel +rhino/enterovirus - Started Tussionex for cough - added brovana  and pulmicort    COPD exacerbation -Continue DuoNebs -Continue Brovana  and Pulmicort  -Continue Solu-Medrol  -added Yupelri   Acute respiratory Failure with hypoxia -new oxygen requirement in last 24 hours -V/Q scan -start IV heparin  pending V/Q results -stable on 2L -repeat CXR   Coffee Grounds Emesis -5/19 AM--had 2 episodes -5/20--no further episodes -GI consult apprciated>>> -5/20 EGD--grade C esophagitis; gastritis; no evidence of active or old bleed -stopping plavix , ASA temporarily 5/29>>can restart if V/Q is negative for PE  Metastatic/Lytic Bone Lesions  -T9 vertebral body biopsy (12/26/2023): Scant fragment of bone with evidence of bone remodeling and minimal marrow space with fibrosis and spindle cells interpreted as myofibroblastic.  Multiple levels were examined and there is scant material.  An attempt to rule out metastatic disease and possible plasma cell neoplasm (CK, AE1/AE3 and MUM1 were performed) were negative.  -appreciate Dr. Cheree Cords consult -discussed with Dr. Nedra Ball IR for L3 biopsy  Myoclonus -  d/c gabapentin   Atypical chest pain - Secondary to left rib  fracture - CT chest shows--minimally displaced fracture of the anteromedial left sixth rib, present since the prior study. No metastatic lesion seenat the fracture site. - Judicious opioids - started Tussionex for cough - troponin 5>>4   Acute on chronic renal failure--CKD stage IV - Baseline creatinine 2.2-2.5 - Presented with serum creatinine 3.32 - Secondary to volume depletion - continue IV fluids>>improving   Diabetes mellitus type 2 - NovoLog  sliding scale - start reduced dose semglee  - 12/11/23 A1C--7.6   Peripheral vascular disease - Continue statin and Pletal  - holding ASA/Plavix  temporarily due to coffee ground emesis>>restart if V/Q is neg   Mixed hyperlipidemia - Continue statin   Essential hypertension - Continue metoprolol >>change to IV temporarily - Holding amlodipine , losartan    Generalized weakness -Multifactorial including deconditioning, infectious process, COPD exacerbation, and possibly metastatic cancer - B12--472 - Folate 15.6 - TSH 2.658 - CPK 67 -PT eval       Family Communication:  spouse and daughter 5/20  Consultants:  med/onc  Code Status:  FULL   DVT Prophylaxis:  IV Heparin   Procedures: As Listed in Progress Note Above  Antibiotics: None          Subjective: Pt states he is breathing better than at time of admission.  Denies cp, n/v/d, abd pain, hematochezia, melena  Objective: Vitals:   01/01/24 1339 01/01/24 1346 01/01/24 1517 01/01/24 1530  BP: (!) 176/79 (!) 161/72 (!) 109/40 (!) 134/58  Pulse: 98  (!) 118 (!) 118  Resp: 18  15 17   Temp: 97.7 F (36.5 C)  98.5 F (36.9 C) 97.6 F (36.4 C)  TempSrc: Oral     SpO2: 97% 98% 99% 95%  Weight: 64 kg     Height: 5\' 7"  (1.702 m)       Intake/Output Summary (Last 24 hours) at 01/01/2024 1840 Last data filed at 01/01/2024 1800 Gross per 24 hour  Intake 1712 ml  Output 1700 ml  Net 12 ml   Weight change:  Exam:  General:  Pt is alert, follows commands  appropriately, not in acute distress HEENT: No icterus, No thrush, No neck mass, [place]/AT Cardiovascular: RRR, S1/S2, no rubs, no gallops Respiratory: bilateral rhonchi.  Bibasilar wheeze Abdomen: Soft/+BS, non tender, non distended, no guarding Extremities: No edema, No lymphangitis, No petechiae, No rashes, no synovitis   Data Reviewed: I have personally reviewed following labs and imaging studies Basic Metabolic Panel: Recent Labs  Lab 12/28/23 0706 12/29/23 0352 12/30/23 0347 12/31/23 0413 01/01/24 0417  NA 133* 135 134* 134* 135  K 4.1 3.6 4.0 4.5 4.8  CL 99 104 106 105 108  CO2 20* 20* 20* 22 21*  GLUCOSE 189* 138* 101* 239* 189*  BUN 44* 38* 38* 57* 72*  CREATININE 3.32* 2.85* 2.55* 2.34* 2.43*  CALCIUM  9.0 8.7* 8.4* 9.1 8.7*  MG  --  2.2 2.1 2.3 2.2  PHOS  --   --  4.0 3.0 3.9   Liver Function Tests: Recent Labs  Lab 01/01/24 0417  AST 16  ALT 10  ALKPHOS 63  BILITOT 0.8  PROT 6.0*  ALBUMIN  2.8*   No results for input(s): "LIPASE", "AMYLASE" in the last 168 hours. Recent Labs  Lab 12/31/23 0811  AMMONIA 13   Coagulation Profile: Recent Labs  Lab 12/26/23 0805  INR 1.1   CBC: Recent Labs  Lab 12/28/23 0706 12/29/23 0352 12/30/23 0347 12/31/23 0811 12/31/23 1524 01/01/24 0417  WBC  15.1* 9.4 11.2* 15.2*  --  13.9*  HGB 9.9* 9.2* 8.1* 8.2* 6.8* 8.0*  HCT 30.2* 30.1* 26.6* 26.0* 21.0* 24.6*  MCV 79.1* 81.1 80.6 81.3  --  82.8  PLT 475* 458* 381 580*  --  401*   Cardiac Enzymes: Recent Labs  Lab 12/28/23 1100  CKTOTAL 67   BNP: Invalid input(s): "POCBNP" CBG: Recent Labs  Lab 12/31/23 2103 01/01/24 0725 01/01/24 1104 01/01/24 1337 01/01/24 1619  GLUCAP 275* 233* 246* 249* 236*   HbA1C: No results for input(s): "HGBA1C" in the last 72 hours. Urine analysis:    Component Value Date/Time   COLORURINE STRAW (A) 12/28/2023 0936   APPEARANCEUR CLEAR 12/28/2023 0936   LABSPEC 1.012 12/28/2023 0936   PHURINE 6.0 12/28/2023 0936    GLUCOSEU >=500 (A) 12/28/2023 0936   HGBUR NEGATIVE 12/28/2023 0936   BILIRUBINUR NEGATIVE 12/28/2023 0936   KETONESUR NEGATIVE 12/28/2023 0936   PROTEINUR NEGATIVE 12/28/2023 0936   UROBILINOGEN 0.2 03/16/2015 0120   NITRITE NEGATIVE 12/28/2023 0936   LEUKOCYTESUR NEGATIVE 12/28/2023 0936   Sepsis Labs: @LABRCNTIP (procalcitonin:4,lacticidven:4) ) Recent Results (from the past 240 hours)  Resp panel by RT-PCR (RSV, Flu A&B, Covid) Anterior Nasal Swab     Status: None   Collection Time: 12/28/23  8:15 AM   Specimen: Anterior Nasal Swab  Result Value Ref Range Status   SARS Coronavirus 2 by RT PCR NEGATIVE NEGATIVE Final    Comment: (NOTE) SARS-CoV-2 target nucleic acids are NOT DETECTED.  The SARS-CoV-2 RNA is generally detectable in upper respiratory specimens during the acute phase of infection. The lowest concentration of SARS-CoV-2 viral copies this assay can detect is 138 copies/mL. A negative result does not preclude SARS-Cov-2 infection and should not be used as the sole basis for treatment or other patient management decisions. A negative result may occur with  improper specimen collection/handling, submission of specimen other than nasopharyngeal swab, presence of viral mutation(s) within the areas targeted by this assay, and inadequate number of viral copies(<138 copies/mL). A negative result must be combined with clinical observations, patient history, and epidemiological information. The expected result is Negative.  Fact Sheet for Patients:  BloggerCourse.com  Fact Sheet for Healthcare Providers:  SeriousBroker.it  This test is no t yet approved or cleared by the [place]  FDA and  has been authorized for detection and/or diagnosis of SARS-CoV-2 by FDA under an Emergency Use Authorization (EUA). This EUA will remain  in effect (meaning this test can be used) for the duration of the COVID-19 declaration under  Section 564(b)(1) of the Act, 21 U.S.C.section 360bbb-3(b)(1), unless the authorization is terminated  or revoked sooner.       Influenza A by PCR NEGATIVE NEGATIVE Final   Influenza B by PCR NEGATIVE NEGATIVE Final    Comment: (NOTE) The Xpert Xpress SARS-CoV-2/FLU/RSV plus assay is intended as an aid in the diagnosis of influenza from Nasopharyngeal swab specimens and should not be used as a sole basis for treatment. Nasal washings and aspirates are unacceptable for Xpert Xpress SARS-CoV-2/FLU/RSV testing.  Fact Sheet for Patients: BloggerCourse.com  Fact Sheet for Healthcare Providers: SeriousBroker.it  This test is not yet approved or cleared by the [place]  FDA and has been authorized for detection and/or diagnosis of SARS-CoV-2 by FDA under an Emergency Use Authorization (EUA). This EUA will remain in effect (meaning this test can be used) for the duration of the COVID-19 declaration under Section 564(b)(1) of the Act, 21 U.S.C. section 360bbb-3(b)(1), unless the authorization is terminated  or revoked.     Resp Syncytial Virus by PCR NEGATIVE NEGATIVE Final    Comment: (NOTE) Fact Sheet for Patients: BloggerCourse.com  Fact Sheet for Healthcare Providers: SeriousBroker.it  This test is not yet approved or cleared by the [place]  FDA and has been authorized for detection and/or diagnosis of SARS-CoV-2 by FDA under an Emergency Use Authorization (EUA). This EUA will remain in effect (meaning this test can be used) for the duration of the COVID-19 declaration under Section 564(b)(1) of the Act, 21 U.S.C. section 360bbb-3(b)(1), unless the authorization is terminated or revoked.  Performed at [facility], [address] [place], [place] [address]   MRSA Next Gen by PCR, Nasal     Status: None   Collection Time: 12/28/23 11:48 AM   Specimen: Nasal Mucosa;  Nasal Swab  Result Value Ref Range Status   MRSA by PCR Next Gen NOT DETECTED NOT DETECTED Final    Comment: (NOTE) The GeneXpert MRSA Assay (FDA approved for NASAL specimens only), is one component of a comprehensive MRSA colonization surveillance program. It is not intended to diagnose MRSA infection nor to guide or monitor treatment for MRSA infections. Test performance is not FDA approved in patients less than 35 years old. Performed at [facility], [address] [place], [place] [address]   Respiratory (~20 pathogens) panel by PCR     Status: Abnormal   Collection Time: 12/28/23 11:48 AM   Specimen: Nasopharyngeal Swab; Respiratory  Result Value Ref Range Status   Adenovirus NOT DETECTED NOT DETECTED Final   Coronavirus 229E NOT DETECTED NOT DETECTED Final    Comment: (NOTE) The Coronavirus on the Respiratory Panel, DOES NOT test for the novel  Coronavirus (2019 nCoV)    Coronavirus HKU1 NOT DETECTED NOT DETECTED Final   Coronavirus NL63 NOT DETECTED NOT DETECTED Final   Coronavirus OC43 NOT DETECTED NOT DETECTED Final   Metapneumovirus NOT DETECTED NOT DETECTED Final   Rhinovirus / Enterovirus DETECTED (A) NOT DETECTED Final   Influenza A NOT DETECTED NOT DETECTED Final   Influenza B NOT DETECTED NOT DETECTED Final   Parainfluenza Virus 1 NOT DETECTED NOT DETECTED Final   Parainfluenza Virus 2 NOT DETECTED NOT DETECTED Final   Parainfluenza Virus 3 NOT DETECTED NOT DETECTED Final   Parainfluenza Virus 4 NOT DETECTED NOT DETECTED Final   Respiratory Syncytial Virus NOT DETECTED NOT DETECTED Final   Bordetella pertussis NOT DETECTED NOT DETECTED Final   Bordetella Parapertussis NOT DETECTED NOT DETECTED Final   Chlamydophila pneumoniae NOT DETECTED NOT DETECTED Final   Mycoplasma pneumoniae NOT DETECTED NOT DETECTED Final    Comment: Performed at [facility], [address], [place], [place] [address]  Expectorated Sputum Assessment w Gram Stain, Rflx to Resp Cult      Status: None   Collection Time: 12/30/23  5:49 AM   Specimen: Sputum  Result Value Ref Range Status   Specimen Description SPUTUM  Final   Special Requests NONE  Final   Sputum evaluation   Final    THIS SPECIMEN IS ACCEPTABLE FOR SPUTUM CULTURE Performed at [facility], [address], [place], [place] [address]    Report Status 12/30/2023 FINAL  Final  Culture, Respiratory w Gram Stain     Status: None   Collection Time: 12/30/23  5:49 AM   Specimen: SPU  Result Value Ref Range Status   Specimen Description   Final    SPUTUM Performed at [facility], [address] [place], [place]  [address]    Special Requests   Final    NONE Reflexed from [id number] Performed at [facility], [address] [place], [place] [address]    Gram Stain   Final    ABUNDANT SQUAMOUS EPITHELIAL CELLS PRESENT WBC SEEN ABUNDANT GRAM NEGATIVE RODS ABUNDANT YEAST ABUNDANT GRAM POSITIVE COCCI    Culture   Final    ABUNDANT Normal respiratory flora-no Staph aureus or Pseudomonas seen Performed at [facility], [address], [place], [place] [address]    Report Status 01/01/2024 FINAL  Final     Scheduled Meds:  sodium chloride    Intravenous Once   acetaminophen   1,000 mg Oral Q8H   arformoterol   15 mcg Nebulization BID   atorvastatin   40 mg Oral QHS   azithromycin   500 mg Oral Daily   budesonide  (PULMICORT ) nebulizer solution  0.5 mg Nebulization BID   chlorpheniramine-HYDROcodone   5 mL Oral Q12H   cilostazol   100 mg Oral BID   cyanocobalamin   1,000 mcg Oral q AM   gabapentin   300 mg Oral BID   insulin  aspart  0-5 Units Subcutaneous QHS   insulin  aspart  0-9 Units Subcutaneous TID WC   insulin  aspart  3 Units Subcutaneous TID WC   insulin  glargine-yfgn  8 Units Subcutaneous QHS   ipratropium-albuterol   3 mL Nebulization TID   methylPREDNISolone  (SOLU-MEDROL ) injection  40 mg Intravenous Q12H   metoprolol  tartrate  5 mg Intravenous Q6H   pantoprazole  (PROTONIX ) IV  40 mg Intravenous  Q12H   revefenacin   175 mcg Nebulization Daily   Continuous Infusions:  ceFEPime  (MAXIPIME ) IV 2 g (01/01/24 1814)    Procedures/Studies: NM PET Image Initial (PI) Skull Base To Thigh Result Date: 01/01/2024 CLINICAL DATA:  Bone lesions. EXAM: NUCLEAR MEDICINE PET SKULL BASE TO THIGH TECHNIQUE: 7.08 mCi F-18 FDG was injected intravenously. Full-ring PET imaging was performed from the skull base to thigh after the radiotracer. CT data was obtained and used for attenuation correction and anatomic localization. Fasting blood glucose: 187 mg/dl COMPARISON:  Remote PET-CT scan 2017 CTA chest 12/10/2023. Spine MRI 12/07/2023. Abdomen pelvis CT 04/13/2023. FINDINGS: Mediastinal blood pool activity: SUV max 2.6 Liver activity: SUV max 2.7 NECK: No specific abnormal radiotracer uptake seen in the neck including along lymph node change of the submandibular, posterior triangle or internal jugular regions. Near symmetric uptake of the visualized intracranial compartment. Incidental CT findings: the parotid glands show some fatty atrophy of the left compared to right. The submandibular glands are preserved. Thyroid  gland is slightly small. Diffuse vascular calcifications. There is some streak artifact related to the patient's dental hardware. Opacification of the right mastoid air cells is seen. Please correlate with symptoms. The paranasal sinuses included in the imaging field are preserved. CHEST: No specific abnormal radiotracer uptake seen above blood pool in the axillary regions, hilum or mediastinum. There is some patchy left upper lobe uptake corresponding to some parenchymal opacities with reticulonodular changes. Uptake is maximum SUV of 2.6. Favor infectious or inflammatory process. Please correlate clinical presentation. Incidental CT findings: Status post median sternotomy. Heart is slightly enlarged. No significant pericardial effusion. Thoracic aorta is normal course and caliber with diffuse vascular  calcifications. Calcifications as well along the great vessels. Slightly patulous esophagus. Breathing motion. No pneumothorax or effusion. Minimal patchy opacities in the right lung. ABDOMEN/PELVIS: There is physiologic distribution radiotracer along the parenchymal organs, bowel and renal collecting systems. No abnormal nodal uptake. Incidental CT findings: Stomach is underdistended but there is slight  wall/fold thickening. Please correlate with symptoms. Small bowel is nondilated. Large bowel is normal course and caliber. There is moderate colonic stool. Surgical changes along the mid sigmoid colon. Dependent stones in the nondilated gallbladder. Bilateral renal atrophy identified, right worse than left. Pole posterior right-sided renal cyst identified measuring 3.5 cm in diameter. This has Hounsfield units of close to 0. Preserved contour to the urinary bladder. Extensive vascular calcifications seen throughout the abdomen including the aorta and branch vessels. There is also a femoral to femoral artery bypass graft. Multiple vascular stents. Please correlate with clinical history. No obvious free air or free fluid. SKELETON: There are multiple scattered areas of mild asymmetric uptake along the osseous structures. Example in the right upper hemi sacrum has maximum SUV value of 2.4. Small sclerotic area in this location on image 133. Focus upper left hemi pelvis at the iliac bone has area of maximum SUV 2.3 and also area of sclerosis on image 122. There are scattered foci along other areas of the pelvis, numerous vertebral bodies as well as along ribs. Neither example includes L3 vertebral body with maximum SUV 4.3. There are areas uptake along some rib fractures including the posterolateral aspect of the right seventh rib and lateral aspect of the left sixth rib. Please see recent MRI of the spine. Incidental CT findings: Scattered degenerative changes. Compression at T9-10. IMPRESSION: Multifocal mild areas of  abnormal uptake along the skeleton including several ribs, vertebral bodies and scattered along the pelvis. Some of these lesions are associated with areas of bony sclerosis. Bilateral rib fractures are seen. Please correlate with clinical history. The right-sided rib fracture appears more subacute in the left more acute. No soft tissue areas of abnormal uptake including along lymph nodes are in the spleen. Slight uptake along patchy left lung opacities and towards the lingula. Please correlate for etiology including infectious or inflammatory process and recommend follow-up. Extensive vascular calcifications identified. Postop chest. 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 Multiple stents. Cardiomegaly. Gallstones. Slight wall thickening of the stomach. Please correlate with any symptoms. Bilateral renal atrophy. Electronically Signed   By: Adrianna Horde M.D.   On: 01/01/2024 10:13   CT Chest Wo Contrast Result Date: 12/28/2023 CLINICAL DATA:  Chest pain, nonspecific ongoing chest pain, post pna. * Tracking Code: BO * EXAM: CT CHEST WITHOUT CONTRAST TECHNIQUE: Multidetector CT imaging of the chest was performed following the standard protocol without IV contrast. RADIATION DOSE REDUCTION: This exam was performed according to the departmental dose-optimization program which includes automated exposure control, adjustment of the mA and/or kV according to patient size and/or use of iterative reconstruction technique. COMPARISON:  PET-CT scan from 12/27/2023 and CT angiography chest from 12/10/2023. FINDINGS: Cardiovascular: Normal cardiac size. No pericardial effusion. No aortic aneurysm. There are coronary artery calcifications, in keeping with coronary artery disease. There are also moderate peripheral atherosclerotic vascular calcifications of thoracic aorta and its major branches. 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 Mediastinum/Nodes: Visualized thyroid  gland appears grossly unremarkable. No solid /  cystic mediastinal masses. The esophagus is nondistended precluding optimal assessment. There are few mildly prominent mediastinal lymph nodes, which do not meet the size criteria for lymphadenopathy and appear grossly similar to the prior study, favoring benign etiology. No axillary lymphadenopathy by size criteria. Evaluation of bilateral hila is limited due to lack on intravenous contrast: however, no large hilar lymphadenopathy identified. Lungs/Pleura: The central tracheo-bronchial tree is patent. There are patchy, heterogeneous, irregular opacities predominantly involving the left upper lobe, which are new  since the prior study. There are also new heterogeneous scattered opacities in the left lung lower lobe. There is continued interval improvement in the opacities in the right upper lobe. Several areas of subsegmental atelectasis throughout bilateral lungs are essentially unchanged in the interim. No new dense consolidation, lung collapse or pleural effusion. Evaluation for lung nodule is limited due to background lung parenchymal changes. Upper Abdomen: Small volume dependent calcified gallstones noted without imaging signs of acute cholecystitis. Remaining visualized upper abdominal viscera within normal limits. Musculoskeletal: The visualized soft tissues of the chest wall are grossly unremarkable. Redemonstration of mixed sclerotic/lytic lesions in the T7 and T9 vertebral body, essentially similar to the prior study. There is pathological fracture with mild compression deformity of T9 vertebral body. There is also ill-defined lytic lesion in the posterolateral right fourth rib (series 2, image 54), unchanged. Subacute/healing pathological fracture of right seventh rib also appears essentially unchanged. There is acute minimally displaced fracture of the anteromedial left sixth rib (series 2, image 79, which was present on the prior exam as well. There are mild multilevel degenerative changes in the  visualized spine. IMPRESSION: 1. There are new heterogeneous opacities in the left lung, as described above, which are nonspecific and may represent multifocal pneumonia. There is continued interval improvement in the opacities in the right upper lobe. 2. Redemonstration of osseous metastases, essentially similar to the prior study, including pathological fracture of T9 vertebral body and right seventh rib. 3. There is minimally displaced fracture of the anteromedial left sixth rib, present since the prior study. No metastatic lesion seen at the fracture site. 4. Multiple other nonacute observations, as described above. Aortic Atherosclerosis (ICD10-I70.0). Electronically Signed   By: Beula Brunswick M.D.   On: 12/28/2023 08:42   DG Chest 2 View Result Date: 12/28/2023 CLINICAL DATA:  Shortness of breath. EXAM: CHEST - 2 VIEW COMPARISON:  12/10/2023 FINDINGS: Low lung volumes. Stable asymmetric elevation left hemidiaphragm. Streaky density at the left base is similar to prior compatible with atelectasis or scarring. Posterior displaced fracture noted in a mid right rib, likely ribs 7. There is some bony callus at the fracture site suggesting nonacute injury. No pneumothorax or substantial pleural effusion. Telemetry leads overlie the chest. IMPRESSION: 1. Low lung volumes with streaky density at the left base compatible with atelectasis or scarring. 2. Nonacute posterior displaced fracture in a mid right rib, likely rib 7. Electronically Signed   By: Donnal Fusi M.D.   On: 12/28/2023 07:27   IR Fluoro Guide Ndl Plmt / BX Result Date: 12/27/2023 INDICATION: Enhancing abnormality of the T9 vertebral body on recent MRI of the thoracic spine. EXAM: BONE BIOPSY AT T9 MEDICATIONS: None. ANESTHESIA/SEDATION: Moderate (conscious) sedation was employed during this procedure. A total of Versed  2 mg and Fentanyl  50 mcg was administered intravenously by the radiology nurse. Total intra-service moderate Sedation Time: 26  minutes. The patient's level of consciousness and vital signs were monitored continuously by radiology nursing throughout the procedure under my direct supervision. Fluoro time: 6 minutes 48 seconds.  272 mGy. COMPLICATIONS: None immediate. PROCEDURE: Informed written consent was obtained from the patient after a thorough discussion of the procedural risks, benefits and alternatives. All questions were addressed. Maximal Sterile Barrier Technique was utilized including caps, mask, sterile gowns, sterile gloves, sterile drape, hand hygiene and skin antiseptic. A timeout was performed prior to the initiation of the procedure. With the patient prone on the fluoroscopic table, the thoracolumbar region was prepped and draped in the usual  manner. The T9 vertebral body was identified on the AP and lateral projection by counting from the sacral region. The skin overlying the right pedicle was then infiltrated with 0.25% bupivacaine , and extended into the underlying paraspinal musculature. Using biplane intermittent fluoroscopy, a 13 gauge Cook spinal needle was advanced into the posterior aspect of T9. Through this, 2 passes were made with a 16 gauge core biopsy needle. Two core samples were obtained and sent in formalin for pathologic evaluation. Hemostasis was achieved at the skin entry site. Patient was then returned to short-stay for recovery. IMPRESSION: Status post fluoroscopic guided core biopsy at T9 vertebral body as described above without event. Electronically Signed   By: Luellen Sages M.D.   On: 12/27/2023 09:02   CT Angio Chest PE W and/or Wo Contrast Result Date: 12/10/2023 CLINICAL DATA:  High probability pulmonary embolism, pneumonia, syncope EXAM: CT ANGIOGRAPHY CHEST WITH CONTRAST TECHNIQUE: Multidetector CT imaging of the chest was performed using the standard protocol during bolus administration of intravenous contrast. Multiplanar CT image reconstructions and MIPs were obtained to evaluate the  vascular anatomy. RADIATION DOSE REDUCTION: This exam was performed according to the departmental dose-optimization program which includes automated exposure control, adjustment of the mA and/or kV according to patient size and/or use of iterative reconstruction technique. CONTRAST:  60mL OMNIPAQUE  IOHEXOL  350 MG/ML SOLN COMPARISON:  12/07/2023 FINDINGS: Cardiovascular: Status post coronary artery bypass grafting. Global cardiac size within limits. No pericardial effusion. Central pulmonary arteries are of normal caliber. No intraluminal filling defect identified through the segmental level to suggest acute pulmonary embolism. Extensive atherosclerotic calcification within the thoracic aorta. No aortic aneurysm. Mediastinum/Nodes: No enlarged mediastinal, hilar, or axillary lymph nodes. Thyroid  gland, trachea, and esophagus demonstrate no significant findings. Lungs/Pleura: Multifocal pulmonary infiltrates are again identified, slightly improved within the left upper lobe, but progressive within the right upper lobe in keeping with changes of multifocal pneumonia. Left lower lobe platelike atelectasis. No pneumothorax or pleural effusion. No central obstructing lesion. Upper Abdomen: Cholelithiasis without pericholecystic inflammatory change. No acute abnormality within visualized upper abdomen. 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 Musculoskeletal: Multiple lytic lesions are again identified within the T7 and T9 vertebral bodies, with pathologic fracture of the T9 vertebral body, and right 4 and 7 ribs, pathologic fracture right seventh rib. The findings are in keeping with with metastatic disease, less likely multiple myeloma. These are better assessed on MRI examination 12/07/2023. Review of the MIP images confirms the above findings. IMPRESSION: 1. No acute pulmonary embolism. 2. Multifocal pulmonary  infiltrates, slightly improved within the left upper lobe, but progressive within the right upper lobe in keeping with changes of multifocal pneumonia. 3. Multiple lytic lesions within the T7 and T9 vertebral bodies, with pathologic fracture of the T9 vertebral body, and right 4 and 7 ribs, and pathologic fracture of the right seventh rib. The findings are in keeping with metastatic disease, less likely multiple myeloma. These are better assessed on MRI examination 12/07/2023. 4. 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 Clinical correlation for signs and symptoms chronic mesenteric ischemia recommended. If indicated, this could be better assessed with dedicated CT arteriography. 5. Cholelithiasis. Electronically Signed   By: Worthy Heads M.D.   On: 12/10/2023 20:19   CT Head Wo Contrast Result Date: 12/10/2023 CLINICAL DATA:  Head trauma, minor (Age >= 65y).  Fall from chair. EXAM: CT  HEAD WITHOUT CONTRAST TECHNIQUE: Contiguous axial images were obtained from the base of the skull through the vertex without intravenous contrast. RADIATION DOSE REDUCTION: This exam was performed according to the departmental dose-optimization program which includes automated exposure control, adjustment of the mA and/or kV according to patient size and/or use of iterative reconstruction technique. COMPARISON:  Head CT 09/13/2018 and MRI 03/29/2018 FINDINGS: Brain: There is no evidence of an acute infarct, intracranial hemorrhage, mass, midline shift, or extra-axial fluid collection. There is mild cerebral atrophy. A normal variant cavum septum pellucidum et vergae is noted. A lacunar infarct in the right corona radiata is new but chronic in appearance. Mild hypodensities elsewhere in the cerebral white matter bilaterally are nonspecific but compatible with chronic small vessel ischemia. Vascular: Calcified atherosclerosis at the  skull base. No hyperdense vessel. Skull: No acute fracture or suspicious lesion. Sinuses/Orbits: Visualized paranasal sinuses are clear. Moderately large right mastoid effusion. Unremarkable orbits. Other: None. IMPRESSION: 1. No evidence of acute intracranial abnormality. 2. Mild chronic small vessel ischemia. Electronically Signed   By: Aundra Lee M.D.   On: 12/10/2023 20:10   DG Chest Portable 1 View Result Date: 12/10/2023 CLINICAL DATA:  Recent pneumonia.  Syncope. EXAM: PORTABLE CHEST 1 VIEW COMPARISON:  Chest radiograph dated 12/07/2023. FINDINGS: Shallow inspiration. Faint bilateral mid to lower lung field interstitial densities may represent atelectasis or infiltrate. No consolidative changes. No large pleural effusion or pneumothorax. Stable cardiac silhouette. Median sternotomy wires and CABG vascular clips. No acute osseous pathology. IMPRESSION: Shallow inspiration with bilateral mid to lower lung field atelectasis or infiltrate. Electronically Signed   By: Angus Bark M.D.   On: 12/10/2023 17:53   MR THORACIC SPINE W WO CONTRAST Result Date: 12/07/2023 CLINICAL DATA:  Thoracic spine compression fracture. Back pain. Decreased appetite. EXAM: MRI THORACIC WITHOUT AND WITH CONTRAST TECHNIQUE: Multiplanar and multiecho pulse sequences of the thoracic spine were obtained without and with intravenous contrast. CONTRAST:  6mL GADAVIST  GADOBUTROL  1 MMOL/ML IV SOLN COMPARISON:  CT chest without contrast 12/07/2023. MRI of the thoracic spine without and with contrast 02/22/2021 FINDINGS: Alignment: No significant listhesis is present. Leftward curvature of the thoracic spine is centered at T8-9. Vertebrae: Pathologic compression fracture is confirmed at T9-10. Abnormal enhancement extending extends into an expanded right pedicle. Prominent lesion across the inferior right lateral aspect of the T7 vertebral body measures 2.2 x 1.4 cm. Extensive enhancement encompasses the majority of the T9 vertebral  body with some sparing on the left. 11 mm rounded enhancing lesion is present in the left T4. Multiple lesions are present at T2, the largest anteriorly on the left along the inferior endplate measuring 11 mm. No other pathologic fractures are present. No significant extraosseous tumor is present. Multiple rib lesions are present bilaterally without a pathologic rib fracture. Cord: Central T2 hyperintensities noted in the cord at the T2-3 level. No other cord signal changes present. Cord morphology is within normal limits. Paraspinal and other soft tissues: The paraspinous soft tissues are within normal limits. The visualized lung fields are clear. No primary lesion is identified. Disc levels: No significant central disc protrusion or central canal stenosis is present. Facet hypertrophy contributes 2 right greater than left foraminal stenosis from T8-9 through T11-12. Moderate right foraminal stenosis is also present at T7-8. IMPRESSION: 1. Pathologic compression fracture at T9-10 with abnormal enhancement extending into an expanded right pedicle. 2. Multiple enhancing lesions throughout the thoracic spine as described compatible with metastatic disease or multiple myeloma. 3. Multiple rib lesions  bilaterally without a pathologic rib fracture. 4. Central T2 hyperintensity in the cord at the T2-3 level is nonspecific but may represent a small focus of myelomalacia. 5. No significant central canal stenosis. 6. Facet hypertrophy contributes to right greater than left foraminal stenosis from T8-9 through T11-12. Moderate right foraminal stenosis is also present at T7-8. These results were called by telephone at the time of interpretation on 12/07/2023 at 6:31 pm to provider JULIE HAVILAND , who verbally acknowledged these results. Electronically Signed   By: Audree Leas M.D.   On: 12/07/2023 18:31   MR Lumbar Spine W Wo Contrast Result Date: 12/07/2023 CLINICAL DATA:  Back pain and chills. EXAM: MRI LUMBAR  SPINE WITHOUT AND WITH CONTRAST TECHNIQUE: Multiplanar and multiecho pulse sequences of the lumbar spine were obtained without and with intravenous contrast. CONTRAST:  6mL GADAVIST  GADOBUTROL  1 MMOL/ML IV SOLN COMPARISON:  CT of the abdomen and pelvis without contrast 04/13/2023 FINDINGS: Segmentation: 5 non rib-bearing lumbar type vertebral bodies are present. The lowest fully formed vertebral body is L5. Alignment: No significant listhesis is present. Rightward curvature of the lumbar spine is centered at L3. Vertebrae: Multiple enhancing lesions are present throughout the lumbar spine. Lesion anteriorly on the left subjacent to the superior endplate of L3 measures 3.1 x 2.6 x 1.7 cm. And a lesion inferiorly in anteriorly on the right measures 1.8 x 1.5 x 1.8 cm. No pathologic fractures are present. Multiple other smaller lesions are present throughout the lumbar spine, visualized sacrum and iliac bones. Conus medullaris and cauda equina: Conus extends to the L1 level. Conus and cauda equina appear normal. Paraspinal and other soft tissues: Simple cysts are present in the pole of both kidneys. The largest is on the right measuring 3 cm. Atherosclerotic changes are present in the aorta and branch vessels. Abnormal signal in the proximal left iliac artery raises concern for occlusion or slow flow. No significant adenopathy is present. No primary lesion is identified. Disc levels: L1-2: Mild disc bulging and facet hypertrophy is present. No significant stenosis is present. L2-3: A broad-based disc protrusion is asymmetric to the left. Mild facet hypertrophy is noted bilaterally. Mild left subarticular and moderate left foraminal stenosis is present. L3-4: A broad-based disc protrusion is present. Moderate facet hypertrophy is worse on the right. This results in mild subarticular and moderate foraminal stenosis bilaterally, right greater than left. L4-5: A broad-based disc protrusion is present. Moderate facet  hypertrophy is worse on the right. Moderate to severe central canal stenosis is present with right greater than left subarticular narrowing. Moderate foraminal stenosis is worse on the left. L5-S1: A shallow right paramedian disc protrusion is present. This potentially contacts the traversing right L5 nerve roots. Facet spurring contributes to moderate right foraminal stenosis. IMPRESSION: 1. Multiple enhancing lesions throughout the lumbar spine, visualized sacrum and iliac bones compatible with metastatic disease or multiple myeloma. 2. No pathologic fractures. 3. Abnormal signal in the proximal left iliac artery raises concern for occlusion or slow flow. 4. 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 5. Mild left subarticular and moderate left foraminal stenosis at L2-3. 6. Mild subarticular and moderate foraminal stenosis bilaterally at L3-4, right greater than left. 7. Moderate to severe central canal stenosis at L4-5 with right greater than left subarticular narrowing. 8. Moderate foraminal stenosis bilaterally at L4-5 is worse on the left. 9. Shallow right paramedian disc protrusion at L5-S1 potentially contacts the traversing right L5 nerve roots. 10. Moderate right foraminal stenosis at L5-S1. These  results were called by telephone at the time of interpretation on 12/07/2023 at 5:57 Pm to provider [facility], who verbally acknowledged these results. Electronically Signed   By: Audree Leas M.D.   On: 12/07/2023 18:17   CT Chest Wo Contrast Result Date: 12/07/2023 CLINICAL DATA:  Back pain, weakness, decreased appetite EXAM: CT CHEST WITHOUT CONTRAST TECHNIQUE: Multidetector CT imaging of the chest was performed following the standard protocol without IV contrast. RADIATION DOSE REDUCTION: This exam was performed according to the departmental dose-optimization program which includes automated exposure control, adjustment of the mA and/or kV according to patient size and/or use of  iterative reconstruction technique. COMPARISON:  12/07/2023, 08/28/2023 FINDINGS: Cardiovascular: Unenhanced imaging of the heart is unremarkable without pericardial effusion. 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 Dense atherosclerosis of the native coronary vasculature. Normal caliber of the thoracic aorta. Extensive atherosclerosis of the aorta and its branches. Assessment of the vascular lumen cannot be performed without IV contrast. Mediastinum/Nodes: No enlarged mediastinal or axillary lymph nodes. Thyroid  gland, trachea, and esophagus demonstrate no significant findings. Lungs/Pleura: Chronic elevation of the left hemidiaphragm, with mild left lower lobe bronchiectasis and scarring again noted. There are also linear opacities within the left upper lobe consistent with scarring seen previously. However, there is some new patchy nodular airspace disease within the bilateral upper lobes, left greater than right, concerning for superimposed pneumonia. No effusion or pneumothorax. Central airways are patent. Upper Abdomen: Cholelithiasis without evidence of acute cholecystitis. No other acute upper abdominal findings. Musculoskeletal: The mixed sclerotic and lucent lesion within the T9 vertebral body with associated right paraspinal soft tissue component is again identified, measuring 2.4 x 2.5 cm reference image 98/2. Since the previous exam, it appears that there is slight invagination of the superior endplate of the T9 vertebral body on the right, consistent with superimposed pathologic fracture. Less than 10% loss of height. No retropulsion. Further evaluation with MRI may be useful given symptoms of back pain. There is a healing subacute to chronic right posterolateral seventh rib fracture with abundant callus formation, new since prior CT. No other acute bony abnormalities. Reconstructed images demonstrate no additional findings. IMPRESSION: 1. New fracture through the superior  endplate of the T9 vertebral body at the site of the mixed sclerotic and lucent lesion seen on prior CT and MRI exams, consistent with pathologic fracture. Further evaluation with MRI may be useful. 2. Patchy bilateral upper lobe nodular airspace disease consistent with inflammatory or infectious etiology. 3. Chronic areas of scarring or atelectasis within the left upper and left lower lobes, with stable elevation of the left hemidiaphragm. 4.  Aortic Atherosclerosis (ICD10-I70.0). Electronically Signed   By: Bobbye Burrow M.D.   On: 12/07/2023 16:39   DG Chest Portable 1 View Result Date: 12/07/2023 CLINICAL DATA:  Cough, back pain EXAM: PORTABLE CHEST - 1 VIEW COMPARISON:  08/09/2023 FINDINGS: Low lung volumes. Chronic linear scarring or atelectasis laterally in the left mid lung. No new infiltrate or overt edema. Heart size and mediastinal contours are within normal limits. Aortic Atherosclerosis (ICD10-170.0). CABG markers. No effusion. Sternotomy wires. IMPRESSION: Low volumes with chronic left mid lung scarring or atelectasis. Electronically Signed   By: Nicoletta Barrier M.D.   On: 12/07/2023 13:29    Demaris Fillers, DO  [facility]  If 7PM-7AM, please contact night-coverage [email or web address] Password [email or web address] 01/01/2024, 6:40 PM   LOS: 4 days

## 2024-01-02 ENCOUNTER — Encounter (HOSPITAL_COMMUNITY): Payer: Self-pay | Admitting: Internal Medicine

## 2024-01-02 ENCOUNTER — Other Ambulatory Visit: Payer: Self-pay | Admitting: *Deleted

## 2024-01-02 ENCOUNTER — Inpatient Hospital Stay: Admitting: Hematology

## 2024-01-02 ENCOUNTER — Encounter (HOSPITAL_COMMUNITY): Payer: Self-pay

## 2024-01-02 ENCOUNTER — Ambulatory Visit (HOSPITAL_COMMUNITY)
Admit: 2024-01-02 | Discharge: 2024-01-02 | Disposition: A | Discharge status: Home / Self Care | Attending: Internal Medicine | Admitting: Internal Medicine

## 2024-01-02 ENCOUNTER — Other Ambulatory Visit: Payer: Self-pay | Admitting: Hematology

## 2024-01-02 ENCOUNTER — Inpatient Hospital Stay (HOSPITAL_COMMUNITY)

## 2024-01-02 DIAGNOSIS — C7951 Secondary malignant neoplasm of bone: Secondary | ICD-10-CM

## 2024-01-02 DIAGNOSIS — K920 Hematemesis: Secondary | ICD-10-CM | POA: Diagnosis not present

## 2024-01-02 DIAGNOSIS — K209 Esophagitis, unspecified without bleeding: Secondary | ICD-10-CM | POA: Diagnosis not present

## 2024-01-02 DIAGNOSIS — D649 Anemia, unspecified: Secondary | ICD-10-CM

## 2024-01-02 DIAGNOSIS — K2970 Gastritis, unspecified, without bleeding: Secondary | ICD-10-CM | POA: Diagnosis not present

## 2024-01-02 DIAGNOSIS — J181 Lobar pneumonia, unspecified organism: Secondary | ICD-10-CM | POA: Diagnosis not present

## 2024-01-02 LAB — COMPREHENSIVE METABOLIC PANEL WITH GFR
ALT: 15 U/L (ref 0–44)
AST: 22 U/L (ref 15–41)
Albumin: 3.2 g/dL — ABNORMAL LOW (ref 3.5–5.0)
Alkaline Phosphatase: 72 U/L (ref 38–126)
Anion gap: 14 (ref 5–15)
BUN: 62 mg/dL — ABNORMAL HIGH (ref 8–23)
CO2: 20 mmol/L — ABNORMAL LOW (ref 22–32)
Calcium: 9.5 mg/dL (ref 8.9–10.3)
Chloride: 104 mmol/L (ref 98–111)
Creatinine, Ser: 2.47 mg/dL — ABNORMAL HIGH (ref 0.61–1.24)
GFR, Estimated: 26 mL/min — ABNORMAL LOW (ref >60)
Glucose, Bld: 225 mg/dL — ABNORMAL HIGH (ref 70–99)
Potassium: 4.8 mmol/L (ref 3.5–5.1)
Sodium: 138 mmol/L (ref 135–145)
Total Bilirubin: 0.8 mg/dL (ref 0.0–1.2)
Total Protein: 6.9 g/dL (ref 6.5–8.1)

## 2024-01-02 LAB — MAGNESIUM: Magnesium: 2.4 mg/dL (ref 1.7–2.4)

## 2024-01-02 LAB — GLUCOSE, CAPILLARY
Glucose-Capillary: 246 mg/dL — ABNORMAL HIGH (ref 70–99)
Glucose-Capillary: 239 mg/dL — ABNORMAL HIGH (ref 70–99)

## 2024-01-02 LAB — CBC
HCT: 28.9 % — ABNORMAL LOW (ref 39.0–52.0)
Hemoglobin: 9.1 g/dL — ABNORMAL LOW (ref 13.0–17.0)
MCH: 26.3 pg (ref 26.0–34.0)
MCHC: 31.5 g/dL (ref 30.0–36.0)
MCV: 83.5 fL (ref 80.0–100.0)
Platelets: 456 10*3/uL — ABNORMAL HIGH (ref 150–400)
RBC: 3.46 MIL/uL — ABNORMAL LOW (ref 4.22–5.81)
RDW: 16.6 % — ABNORMAL HIGH (ref 11.5–15.5)
WBC: 18.1 10*3/uL — ABNORMAL HIGH (ref 4.0–10.5)
nRBC: 0 % (ref 0.0–0.2)

## 2024-01-02 LAB — HEPARIN LEVEL (UNFRACTIONATED): Heparin Unfractionated: 0.7 [IU]/mL (ref 0.30–0.70)

## 2024-01-02 MED ORDER — TECHNETIUM TO 99M ALBUMIN AGGREGATED
4.0000 | Freq: Once | INTRAVENOUS | Status: AC | PRN
  Administered 2024-01-02: 4.4 via INTRAVENOUS

## 2024-01-02 MED ORDER — CLOPIDOGREL BISULFATE 75 MG PO TABS
75.0000 mg | ORAL_TABLET | Freq: Every day | ORAL | Status: DC
  Administered 2024-01-02 – 2024-01-03 (×2): 75 mg via ORAL
  Filled 2024-01-02 (×2): qty 1

## 2024-01-02 MED ORDER — ASPIRIN 81 MG PO TBEC
81.0000 mg | DELAYED_RELEASE_TABLET | Freq: Every day | ORAL | Status: DC
  Administered 2024-01-02 – 2024-01-03 (×2): 81 mg via ORAL
  Filled 2024-01-02 (×2): qty 1

## 2024-01-02 NOTE — Progress Notes (Signed)
 Interventional Radiology Brief Note:  Patient transported to [facility] for procedure this afternoon, however it was discovered he received aspirin  and Plavix  within the last 5 days.  Patient also on Pletal .    Procedure canceled.   Patient may hold antiplatelet therapy for 4 days and reattempt biopsy early next week if he remains inpatient.  May also be scheduled for outpatient biopsy.  Defer to team on which option will better serve patient.   Twila Rappa, MS RD PA-C

## 2024-01-02 NOTE — Progress Notes (Signed)
 Pt transported to [facility] by [facility] for procedure, no distress noted at time of departure.

## 2024-01-02 NOTE — Progress Notes (Addendum)
  Subjective: Feeling okay. No nausea, vomiting or abdominal pain. Last BM was Monday, no blood or black color to it. Denies SOB or dizziness.   Objective: Vital signs in last 24 hours: Temp:  [97.6 F (36.4 C)-98.5 F (36.9 C)] 98.2 F (36.8 C) (05/21 0536) Pulse Rate:  [92-118] 98 (05/21 0536) Resp:  [15-20] 20 (05/21 0536) BP: (109-176)/(40-79) 176/79 (05/21 0536) SpO2:  [91 %-100 %] 100 % (05/21 0805) Weight:  [64 kg] 64 kg (05/20 1339) Last BM Date : 12/28/23 General:   Alert and oriented, pleasant Head:  Normocephalic and atraumatic. Eyes:  No icterus, sclera clear. Conjuctiva pink.  Mouth:  Without lesions, mucosa pink and moist.  Heart:  S1, S2 present, no murmurs noted.  Lungs: +rhonchi, +wheezing Abdomen:  Bowel sounds present, soft, non-tender, non-distended. No HSM or hernias noted. No rebound or guarding. No masses appreciated  Neurologic:  Alert and  oriented x4;  grossly normal neurologically. Skin:  Warm and dry, intact without significant lesions.  Psych:  Alert and cooperative. Normal mood and affect.  Intake/Output from previous day: 05/20 0701 - 05/21 0700 In: 821.6 [P.O.:360; I.V.:461.6] Out: 1875 [Urine:1875] Intake/Output this shift: No intake/output data recorded.  Lab Results: Recent Labs    12/31/23 0811 12/31/23 1524 01/01/24 0417 01/02/24 0539  WBC 15.2*  --  13.9* 18.1*  HGB 8.2* 6.8* 8.0* 9.1*  HCT 26.0* 21.0* 24.6* 28.9*  PLT 580*  --  401* 456*   BMET Recent Labs    12/31/23 0413 01/01/24 0417 01/02/24 0539  NA 134* 135 138  K 4.5 4.8 4.8  CL 105 108 104  CO2 22 21* 20*  GLUCOSE 239* 189* 225*  BUN 57* 72* 62*  CREATININE 2.34* 2.43* 2.47*  CALCIUM  9.1 8.7* 9.5   LFT Recent Labs    01/01/24 0417 01/02/24 0539  PROT 6.0* 6.9  ALBUMIN  2.8* 3.2*  AST 16 22  ALT 10 15  ALKPHOS 63 72  BILITOT 0.8 0.8   PT/INR Recent Labs    01/01/24 2039  LABPROT 14.9  INR 1.1   Assessment: [name] is a 78 year old male  with multimorbidities including CAD s/p CABG, HTN, DM, HLD, CKD, PVD, on Plavix  and aspirin  as outpatient, metastatic bone lesions with evaluation underway by Dr. 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   Coffee ground emesis/anemia:  -history of grade D esophagitis. -Hgb 10.7 on arrival, declined as low as 6.8 on 5/19, improved s/p transfusion of 2 units PRBCs. -hgb Stable, up to 9.1 today from 8 yesterday -Underwent EGD yesterday which showed grade C esophagitis with no bleeding, gastritis, no evidence of active or old bleeding seen throughout exam.  -denies rectal bleeding/melena, no further nausea/vomiting/hematemesis -etiology of Coffee ground emesis/anemia Is unclear, anemia could be due in part possibly to metastatic disease of unknown origin    Plan: PPI BID Can resume antiplatelet therapy as necessary Monitor for overt GI bleeding Trend h&h  GI will sign off today, needs outpatient follow up in 2-3 weeks    LOS: 5 days    01/02/2024, 9:03 AM  Trenell Concannon L. Sarahy Creedon, MSN, APRN, AGNP-C Adult-Gerontology Nurse Practitioner [facility] at [facility]

## 2024-01-02 NOTE — Inpatient Diabetes Management (Signed)
 Inpatient Diabetes Program Recommendations  AACE/ADA: New Consensus Statement on Inpatient Glycemic Control   Target Ranges:  Prepandial:   less than 140 mg/dL      Peak postprandial:   less than 180 mg/dL (1-2 hours)      Critically ill patients:  140 - 180 mg/dL    Latest Reference Range & Units 01/01/24 07:25 01/01/24 11:04 01/01/24 13:37 01/01/24 16:19 01/01/24 20:40 01/02/24 07:13 01/02/24 11:09  Glucose-Capillary 70 - 99 mg/dL 161 (H) 096 (H) 045 (H) 236 (H) 271 (H) 246 (H) 239 (H)   Review of Glycemic Control  Diabetes history: DM2 Outpatient Diabetes medications: Lantus  20 units at bedtime, Jardiance  12.5 mg daily Current orders for Inpatient glycemic control: Semglee  8 units at bedtime, Novolog  0-9 units TID with meals, Novolog  0-5 units at bedtime, Novolog  3 units TID with meals; Solumedrol 40 mg Q12H  Inpatient Diabetes Program Recommendations:    Insulin : If steroids are continued as ordered, please consider increasing Semglee  to 12 units at bedtime and meal coverage to Novolog  5 units TID with meals.  Thanks, Beacher Limerick, RN, MSN, CDCES Diabetes Coordinator Inpatient Diabetes Program [phone] (Team Pager from 8am to 5pm)

## 2024-01-02 NOTE — Progress Notes (Signed)
 Mobility Specialist Progress Note:    01/02/24 1043  Mobility  Activity Ambulated with assistance in hallway  Level of Assistance Standby assist, set-up cues, supervision of patient - no hands on  Assistive Device None  Distance Ambulated (ft) 115 ft  Range of Motion/Exercises Active;All extremities  Activity Response Tolerated well  Mobility Referral Yes  Mobility visit 1 Mobility  Mobility Specialist Start Time (ACUTE ONLY) 1022  Mobility Specialist Stop Time (ACUTE ONLY) 1040  Mobility Specialist Time Calculation (min) (ACUTE ONLY) 18 min   Pt received in bed, agreeable to mobility. Required supervision to stand and ambulate with no AD. tolerated well, asx throughout. Returned to room, left pt sitting EOB. All needs met.  [name] Mobility Specialist Please contact via Special educational needs teacher or  Rehab office at [phone]

## 2024-01-02 NOTE — Consult Note (Signed)
 PHARMACY - ANTICOAGULATION CONSULT NOTE  Pharmacy Consult for Heparin  Indication: VTE treatment  Allergies  Allergen Reactions   Acarbose Diarrhea   Patient Measurements: Height: 5\' 7"  (170.2 cm) Weight: 64 kg (141 lb) IBW/kg (Calculated) : 66.1 HEPARIN  DW (KG): 64  Vital Signs: Temp: 98.2 F (36.8 C) (05/21 0536) Temp Source: Oral (05/21 0536) BP: 176/79 (05/21 0536) Pulse Rate: 98 (05/21 0536)  Labs: Recent Labs    12/31/23 0413 12/31/23 1610 12/31/23 0811 12/31/23 1524 01/01/24 0417 01/01/24 2039 01/02/24 0539  HGB  --  8.2*   < > 6.8* 8.0*  --  9.1*  HCT  --  26.0*   < > 21.0* 24.6*  --  28.9*  PLT  --  580*  --   --  401*  --  456*  APTT  --   --   --   --   --  29  --   LABPROT  --   --   --   --   --  14.9  --   INR  --   --   --   --   --  1.1  --   HEPARINUNFRC  --   --   --   --   --   --  0.70  CREATININE 2.34*  --   --   --  2.43*  --   --    < > = values in this interval not displayed.   Estimated Creatinine Clearance: 23.4 mL/min (A) (by C-G formula based on SCr of 2.43 mg/dL (H)).  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 Per note, patient had coffee ground emesis x 2 on 5/19. They are reported to have no further episodes today. Their Plavix  and ASA were held with plans to restart if V/Q is negative for PE. Pharmacy has been consulted to manage this patient's heparin .   HL 0.70 - therapeutic on the upper range Hgb 9.1  Goal of Therapy:  Heparin  level 0.3-0.7 units/ml Monitor platelets by anticoagulation protocol: Yes   Plan:  Decrease heparin  infusion to 750  units/hr Check heparin  levels in 8 hours Continue to monitor CBC and platelets while on heparin    Thank you for allowing pharmacy to be involved in this patient's care.  Cliffton Dama, PharmD Clinical Pharmacist 01/02/2024 7:45 AM

## 2024-01-02 NOTE — Sedation Documentation (Signed)
 Pt transported by [facility] back to [facility] @ 1313.

## 2024-01-02 NOTE — Progress Notes (Signed)
 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 intake for the last several days.  He states that his coughing has worsened since 12/27/2023.  This has resulted in him having left-sided chest pain.  He states that the chest pain is elicited only when he coughs.  He denies any hemoptysis.  He does have a little worsening of his shortness of breath.  Notably, the patient was recently mated to the hospital from 12/10/2023 to 12/11/2023 secondary to syncope which was felt to be orthostatic in nature.  In addition, the patient had hospital admission from 12/07/2023 to 12/10/2023 secondary to pneumonia.  He was discharged home with levofloxacin . During his April admission for pneumonia, CT of the chest on 12/10/2023 was negative for PE but showed a pathologic fracture of T9 vertebra.  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  He has subsequently followed up with Dr. Cheree Cords.  He had a biopsy of his T9 vertebra on 12/26/2023.  Results are pending at this time.   Since his discharge from the hospital, the patient follow-up with his PCP.  The patient was started on Augmentin  and doxycycline  without improvement.   Multiple Myeloma Panel fron 12/07/23 showed elevated Alpha 2 globulin  at 1.3 and M-spike was not observed. Urine protein electrophoresis from 12/07/23 was normal.  His tumor markers including AFP, CA 19-9, and CEA were unremarkable.  His PSA was 3.89. His kappa and lambda free light chains were elevated, but kappa/lambda ratio was  normal on 12/18/2023.   In the ED, the patient was afebrile hemodynamically stable with oxygen saturation 96% room air.  WBC 15.1, hemoglobin 9.9, platelets 475.  Sodium 133, potassium 4.1, bicarbonate 20, serum creatinine 3.32.  The patient was started on azithromycin  and ceftriaxone .   Subjective: Breathing much better, no new complaints, eager to get biopsy and hopeful for DC in next 24 hours. No chest pain.   Objective: BP (!) 185/74   Pulse (!) 105   Temp 97.8 F (36.6 C) (Oral)   Resp 16   Ht 5\' 7"  (1.702 m)   Wt 64 kg   SpO2 99%   BMI 22.08 kg/m   Gen: No distress Pulm: Clear, nonlabored  CV: RRR GI: Soft, NT, ND, +BS Neuro: Alert and oriented. No new focal deficits. Ext: Warm, no deformities. Skin: No rashes, lesions or ulcers on visualized skin   Assessment & Plan: Lobar pneumonia -12/28/2023 CT chest--new heterogenous opacities LUL and LLL; continued improvement of the opacities in the right upper lobe; Redemonstration of osseous metastases, essentially similar to the prior study, including pathological fracture of T9 vertebral body and right seventh rib. - Completed cefepime  and azithromycin   - MRSA screen--neg - Check PCT <0.10 - Viral respiratory panel +rhino/enterovirus - Started Tussionex for cough - added brovana  and pulmicort    COPD exacerbation -Continue DuoNebs -Continue Brovana  and Pulmicort  -Continue Solu-Medrol  today, wheezing resolved, respiratory status much improved. Plan to complete steroid therapy today.  -added Yupelri    Acute hypoxic respiratory failure: Improved. V/Q scan pending, on heparin  pending result. -new oxygen requirement in last 24 hours -V/Q scan -start IV heparin  pending V/Q results -  stable on 2L -repeat CXR   Coffee Grounds Emesis -5/19 AM--had 2 episodes -5/20--no further episodes -GI consult apprciated>>> -5/20 EGD--grade C esophagitis; gastritis; no evidence of active or old bleed -stopping plavix , ASA temporarily  5/29>>can restart if V/Q is negative for PE   Metastatic/Lytic Bone Lesions  -T9 vertebral body biopsy (12/26/2023): Scant fragment of bone with evidence of bone remodeling and minimal marrow space with fibrosis and spindle cells interpreted as myofibroblastic.  Multiple levels were examined and there is scant material.  An attempt to rule out metastatic disease and possible plasma cell neoplasm (CK, AE1/AE3 and MUM1 were performed) were negative.  -appreciate Dr. Cheree Cords consult -discussed with Dr. Nedra Ball IR for L3 biopsy   Myoclonus -d/c gabapentin    Atypical chest pain - Secondary to left rib fracture - CT chest shows--minimally displaced fracture of the anteromedial left sixth rib, present since the prior study. No metastatic lesion seenat the fracture site. - Judicious opioids - started Tussionex for cough - troponin 5>>4   Acute on chronic renal failure--CKD stage IV - Baseline creatinine 2.2-2.5 - Presented with serum creatinine 3.32, since improved to baseline.  Diabetes mellitus type 2 - NovoLog  sliding scale - start reduced dose semglee  - 12/11/23 A1C--7.6   Peripheral vascular disease - Continue statin and Pletal  - holding ASA/Plavix  temporarily due to coffee ground emesis>>restart if V/Q is neg   Mixed hyperlipidemia - Continue statin   Essential hypertension - Continue metoprolol  - Holding amlodipine , losartan    Generalized weakness -Multifactorial including deconditioning, infectious process, COPD exacerbation, and possibly metastatic cancer. Has improved considerably with treatments, no PT follow up recommended at this time. - B12--472 - Folate 15.6 - TSH 2.658 - CPK 67   Wynetta Heckle, MD [facility] [email or web address] 01/02/2024, 3:05 PM

## 2024-01-02 NOTE — Plan of Care (Signed)
   Problem: Education: Goal: Knowledge of General Education information will improve Description Including pain rating scale, medication(s)/side effects and non-pharmacologic comfort measures Outcome: Progressing   Problem: Health Behavior/Discharge Planning: Goal: Ability to manage health-related needs will improve Outcome: Progressing

## 2024-01-02 NOTE — Sedation Documentation (Addendum)
 Pt has not been off ASA and plavix  for minimum of 5 days, case must be rescheduled per MD Deveshwar. Pt updated, all questions answered by this RN and MD Deveshwar. [facility] called for transport and Cristine Done RN aware.

## 2024-01-03 ENCOUNTER — Inpatient Hospital Stay (HOSPITAL_COMMUNITY)

## 2024-01-03 DIAGNOSIS — J181 Lobar pneumonia, unspecified organism: Secondary | ICD-10-CM | POA: Diagnosis not present

## 2024-01-03 LAB — BASIC METABOLIC PANEL WITH GFR
Anion gap: 9 (ref 5–15)
BUN: 56 mg/dL — ABNORMAL HIGH (ref 8–23)
CO2: 21 mmol/L — ABNORMAL LOW (ref 22–32)
Calcium: 8.6 mg/dL — ABNORMAL LOW (ref 8.9–10.3)
Chloride: 103 mmol/L (ref 98–111)
Creatinine, Ser: 2.1 mg/dL — ABNORMAL HIGH (ref 0.61–1.24)
GFR, Estimated: 32 mL/min — ABNORMAL LOW (ref >60)
Glucose, Bld: 230 mg/dL — ABNORMAL HIGH (ref 70–99)
Potassium: 3.9 mmol/L (ref 3.5–5.1)
Sodium: 133 mmol/L — ABNORMAL LOW (ref 135–145)

## 2024-01-03 LAB — GLUCOSE, CAPILLARY
Glucose-Capillary: 150 mg/dL — ABNORMAL HIGH (ref 70–99)
Glucose-Capillary: 269 mg/dL — ABNORMAL HIGH (ref 70–99)
Glucose-Capillary: 228 mg/dL — ABNORMAL HIGH (ref 70–99)
Glucose-Capillary: 321 mg/dL — ABNORMAL HIGH (ref 70–99)

## 2024-01-03 LAB — CBC
HCT: 25.8 % — ABNORMAL LOW (ref 39.0–52.0)
Hemoglobin: 8.2 g/dL — ABNORMAL LOW (ref 13.0–17.0)
MCH: 26.2 pg (ref 26.0–34.0)
MCHC: 31.8 g/dL (ref 30.0–36.0)
MCV: 82.4 fL (ref 80.0–100.0)
Platelets: 398 10*3/uL (ref 150–400)
RBC: 3.13 MIL/uL — ABNORMAL LOW (ref 4.22–5.81)
RDW: 16.9 % — ABNORMAL HIGH (ref 11.5–15.5)
WBC: 13.1 10*3/uL — ABNORMAL HIGH (ref 4.0–10.5)
nRBC: 0.2 % (ref 0.0–0.2)

## 2024-01-03 LAB — HEMOGLOBIN AND HEMATOCRIT, BLOOD
HCT: 27.9 % — ABNORMAL LOW (ref 39.0–52.0)
Hemoglobin: 9.3 g/dL — ABNORMAL LOW (ref 13.0–17.0)

## 2024-01-03 LAB — SURGICAL PATHOLOGY

## 2024-01-03 MED ORDER — AMLODIPINE BESYLATE 5 MG PO TABS
10.0000 mg | ORAL_TABLET | Freq: Every day | ORAL | Status: DC
  Administered 2024-01-03: 10 mg via ORAL
  Filled 2024-01-03: qty 2

## 2024-01-03 MED ORDER — ALBUTEROL SULFATE HFA 108 (90 BASE) MCG/ACT IN AERS: 2.0000 | INHALATION_SPRAY | Freq: Four times a day (QID) | RESPIRATORY_TRACT | 2 refills | Status: DC | PRN

## 2024-01-03 MED ORDER — GABAPENTIN 300 MG PO CAPS: 300.0000 mg | ORAL_CAPSULE | Freq: Two times a day (BID) | ORAL | 0 refills | Status: DC

## 2024-01-03 MED ORDER — PANTOPRAZOLE SODIUM 40 MG PO TBEC: 40.0000 mg | DELAYED_RELEASE_TABLET | Freq: Two times a day (BID) | ORAL | 0 refills | Status: DC

## 2024-01-03 NOTE — Discharge Summary (Signed)
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 program which includes automated exposure control, adjustment of the mA and/or kV according to patient size and/or use of iterative reconstruction technique. CONTRAST:  60mL OMNIPAQUE  IOHEXOL  350 MG/ML SOLN COMPARISON:  12/07/2023 FINDINGS: Cardiovascular: Status post coronary artery bypass grafting. Global cardiac size within limits. No pericardial effusion. Central pulmonary arteries are of normal caliber. No intraluminal filling defect identified through the segmental level to suggest acute pulmonary embolism. Extensive atherosclerotic calcification within the thoracic aorta. No aortic aneurysm. Mediastinum/Nodes: No enlarged mediastinal, hilar, or axillary lymph nodes. Thyroid  gland, trachea, and esophagus demonstrate no significant findings. Lungs/Pleura: Multifocal pulmonary infiltrates are again identified, slightly improved within the left upper lobe, but progressive within the right upper lobe in keeping with changes of multifocal pneumonia. Left lower lobe platelike atelectasis. No pneumothorax or pleural effusion. No central obstructing lesion. Upper Abdomen: Cholelithiasis without  pericholecystic inflammatory change. No acute abnormality within visualized upper abdomen. 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 Musculoskeletal: Multiple lytic lesions are again identified within the T7 and T9 vertebral bodies, with pathologic fracture of the T9 vertebral body, and right 4 and 7 ribs, pathologic fracture right seventh rib. The findings are in keeping with with metastatic disease, less likely multiple myeloma. These are better assessed on MRI examination 12/07/2023. Review of the MIP images confirms the above findings. IMPRESSION: 1. No acute pulmonary embolism. 2. Multifocal pulmonary infiltrates, slightly improved within the left upper lobe, but progressive within the right upper lobe in keeping with changes of multifocal pneumonia. 3. Multiple lytic lesions within the T7 and T9 vertebral bodies, with pathologic fracture of the T9 vertebral body, and right 4 and 7 ribs, and pathologic fracture of the right seventh rib. The findings are in keeping with metastatic disease, less likely multiple myeloma. These are better assessed on MRI examination 12/07/2023. 4. 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 Clinical correlation for signs and symptoms chronic mesenteric ischemia recommended. If indicated, this could be better assessed with dedicated CT arteriography. 5. Cholelithiasis. Electronically Signed   By: Worthy Heads M.D.   On: 12/10/2023 20:19   CT Head Wo Contrast Result Date: 12/10/2023 CLINICAL DATA:  Head trauma, minor (Age >= 65y).  Fall from chair. EXAM: CT HEAD WITHOUT CONTRAST TECHNIQUE: Contiguous axial images were obtained from the base of the skull through the vertex without intravenous contrast. RADIATION DOSE REDUCTION: This exam was performed according to the departmental  dose-optimization program which includes automated exposure control, adjustment  of the mA and/or kV according to patient size and/or use of iterative reconstruction technique. COMPARISON:  Head CT 09/13/2018 and MRI 03/29/2018 FINDINGS: Brain: There is no evidence of an acute infarct, intracranial hemorrhage, mass, midline shift, or extra-axial fluid collection. There is mild cerebral atrophy. A normal variant cavum septum pellucidum et vergae is noted. A lacunar infarct in the right corona radiata is new but chronic in appearance. Mild hypodensities elsewhere in the cerebral white matter bilaterally are nonspecific but compatible with chronic small vessel ischemia. Vascular: Calcified atherosclerosis at the skull base. No hyperdense vessel. Skull: No acute fracture or suspicious lesion. Sinuses/Orbits: Visualized paranasal sinuses are clear. Moderately large right mastoid effusion. Unremarkable orbits. Other: None. IMPRESSION: 1. No evidence of acute intracranial abnormality. 2. Mild chronic small vessel ischemia. Electronically Signed   By: Aundra Lee M.D.   On: 12/10/2023 20:10   DG Chest Portable 1 View Result Date: 12/10/2023 CLINICAL DATA:  Recent pneumonia.  Syncope. EXAM: PORTABLE CHEST 1 VIEW COMPARISON:  Chest radiograph dated 12/07/2023. FINDINGS: Shallow inspiration. Faint bilateral mid to lower lung field interstitial densities may represent atelectasis or infiltrate. No consolidative changes. No large pleural effusion or pneumothorax. Stable cardiac silhouette. Median sternotomy wires and CABG vascular clips. No acute osseous pathology. IMPRESSION: Shallow inspiration with bilateral mid to lower lung field atelectasis or infiltrate. Electronically Signed   By: Angus Bark M.D.   On: 12/10/2023 17:53   MR THORACIC SPINE W WO CONTRAST Result Date: 12/07/2023 CLINICAL DATA:  Thoracic spine compression fracture. Back pain. Decreased appetite. EXAM: MRI THORACIC WITHOUT AND WITH CONTRAST  TECHNIQUE: Multiplanar and multiecho pulse sequences of the thoracic spine were obtained without and with intravenous contrast. CONTRAST:  6mL GADAVIST  GADOBUTROL  1 MMOL/ML IV SOLN COMPARISON:  CT chest without contrast 12/07/2023. MRI of the thoracic spine without and with contrast 02/22/2021 FINDINGS: Alignment: No significant listhesis is present. Leftward curvature of the thoracic spine is centered at T8-9. Vertebrae: Pathologic compression fracture is confirmed at T9-10. Abnormal enhancement extending extends into an expanded right pedicle. Prominent lesion across the inferior right lateral aspect of the T7 vertebral body measures 2.2 x 1.4 cm. Extensive enhancement encompasses the majority of the T9 vertebral body with some sparing on the left. 11 mm rounded enhancing lesion is present in the left T4. Multiple lesions are present at T2, the largest anteriorly on the left along the inferior endplate measuring 11 mm. No other pathologic fractures are present. No significant extraosseous tumor is present. Multiple rib lesions are present bilaterally without a pathologic rib fracture. Cord: Central T2 hyperintensities noted in the cord at the T2-3 level. No other cord signal changes present. Cord morphology is within normal limits. Paraspinal and other soft tissues: The paraspinous soft tissues are within normal limits. The visualized lung fields are clear. No primary lesion is identified. Disc levels: No significant central disc protrusion or central canal stenosis is present. Facet hypertrophy contributes 2 right greater than left foraminal stenosis from T8-9 through T11-12. Moderate right foraminal stenosis is also present at T7-8. IMPRESSION: 1. Pathologic compression fracture at T9-10 with abnormal enhancement extending into an expanded right pedicle. 2. Multiple enhancing lesions throughout the thoracic spine as described compatible with metastatic disease or multiple myeloma. 3. Multiple rib lesions  bilaterally without a pathologic rib fracture. 4. Central T2 hyperintensity in the cord at the T2-3 level is nonspecific but may represent a small focus of myelomalacia. 5. No significant central canal stenosis. 6. Facet hypertrophy contributes to right greater  than left foraminal stenosis from T8-9 through T11-12. Moderate right foraminal stenosis is also present at T7-8. These results were called by telephone at the time of interpretation on 12/07/2023 at 6:31 pm to provider JULIE HAVILAND , who verbally acknowledged these results. Electronically Signed   By: Audree Leas M.D.   On: 12/07/2023 18:31   MR Lumbar Spine W Wo Contrast Result Date: 12/07/2023 CLINICAL DATA:  Back pain and chills. EXAM: MRI LUMBAR SPINE WITHOUT AND WITH CONTRAST TECHNIQUE: Multiplanar and multiecho pulse sequences of the lumbar spine were obtained without and with intravenous contrast. CONTRAST:  6mL GADAVIST  GADOBUTROL  1 MMOL/ML IV SOLN COMPARISON:  CT of the abdomen and pelvis without contrast 04/13/2023 FINDINGS: Segmentation: 5 non rib-bearing lumbar type vertebral bodies are present. The lowest fully formed vertebral body is L5. Alignment: No significant listhesis is present. Rightward curvature of the lumbar spine is centered at L3. Vertebrae: Multiple enhancing lesions are present throughout the lumbar spine. Lesion anteriorly on the left subjacent to the superior endplate of L3 measures 3.1 x 2.6 x 1.7 cm. And a lesion inferiorly in anteriorly on the right measures 1.8 x 1.5 x 1.8 cm. No pathologic fractures are present. Multiple other smaller lesions are present throughout the lumbar spine, visualized sacrum and iliac bones. Conus medullaris and cauda equina: Conus extends to the L1 level. Conus and cauda equina appear normal. Paraspinal and other soft tissues: Simple cysts are present in the pole of both kidneys. The largest is on the right measuring 3 cm. Atherosclerotic changes are present in the aorta and branch  vessels. Abnormal signal in the proximal left iliac artery raises concern for occlusion or slow flow. No significant adenopathy is present. No primary lesion is identified. Disc levels: L1-2: Mild disc bulging and facet hypertrophy is present. No significant stenosis is present. L2-3: A broad-based disc protrusion is asymmetric to the left. Mild facet hypertrophy is noted bilaterally. Mild left subarticular and moderate left foraminal stenosis is present. L3-4: A broad-based disc protrusion is present. Moderate facet hypertrophy is worse on the right. This results in mild subarticular and moderate foraminal stenosis bilaterally, right greater than left. L4-5: A broad-based disc protrusion is present. Moderate facet hypertrophy is worse on the right. Moderate to severe central canal stenosis is present with right greater than left subarticular narrowing. Moderate foraminal stenosis is worse on the left. L5-S1: A shallow right paramedian disc protrusion is present. This potentially contacts the traversing right L5 nerve roots. Facet spurring contributes to moderate right foraminal stenosis. IMPRESSION: 1. Multiple enhancing lesions throughout the lumbar spine, visualized sacrum and iliac bones compatible with metastatic disease or multiple myeloma. 2. No pathologic fractures. 3. Abnormal signal in the proximal left iliac artery raises concern for occlusion or slow flow. 4. 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 5. Mild left subarticular and moderate left foraminal stenosis at L2-3. 6. Mild subarticular and moderate foraminal stenosis bilaterally at L3-4, right greater than left. 7. Moderate to severe central canal stenosis at L4-5 with right greater than left subarticular narrowing. 8. Moderate foraminal stenosis bilaterally at L4-5 is worse on the left. 9. Shallow right paramedian disc protrusion at L5-S1 potentially contacts the traversing right L5 nerve roots. 10. Moderate right foraminal stenosis  at L5-S1. These results were called by telephone at the time of interpretation on 12/07/2023 at 5:57 Pm to provider [facility], who verbally acknowledged these results. Electronically Signed   By: Audree Leas M.D.   On: 12/07/2023 18:17   CT  Chest Wo Contrast Result Date: 12/07/2023 CLINICAL DATA:  Back pain, weakness, decreased appetite EXAM: CT CHEST WITHOUT CONTRAST TECHNIQUE: Multidetector CT imaging of the chest was performed following the standard protocol without IV contrast. RADIATION DOSE REDUCTION: This exam was performed according to the departmental dose-optimization program which includes automated exposure control, adjustment of the mA and/or kV according to patient size and/or use of iterative reconstruction technique. COMPARISON:  12/07/2023, 08/28/2023 FINDINGS: Cardiovascular: Unenhanced imaging of the heart is unremarkable without pericardial effusion. 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 Dense atherosclerosis of the native coronary vasculature. Normal caliber of the thoracic aorta. Extensive atherosclerosis of the aorta and its branches. Assessment of the vascular lumen cannot be performed without IV contrast. Mediastinum/Nodes: No enlarged mediastinal or axillary lymph nodes. Thyroid  gland, trachea, and esophagus demonstrate no significant findings. Lungs/Pleura: Chronic elevation of the left hemidiaphragm, with mild left lower lobe bronchiectasis and scarring again noted. There are also linear opacities within the left upper lobe consistent with scarring seen previously. However, there is some new patchy nodular airspace disease within the bilateral upper lobes, left greater than right, concerning for superimposed pneumonia. No effusion or pneumothorax. Central airways are patent. Upper Abdomen: Cholelithiasis without evidence of acute cholecystitis. No other acute upper abdominal findings. Musculoskeletal: The mixed sclerotic and lucent lesion within the  T9 vertebral body with associated right paraspinal soft tissue component is again identified, measuring 2.4 x 2.5 cm reference image 98/2. Since the previous exam, it appears that there is slight invagination of the superior endplate of the T9 vertebral body on the right, consistent with superimposed pathologic fracture. Less than 10% loss of height. No retropulsion. Further evaluation with MRI may be useful given symptoms of back pain. There is a healing subacute to chronic right posterolateral seventh rib fracture with abundant callus formation, new since prior CT. No other acute bony abnormalities. Reconstructed images demonstrate no additional findings. IMPRESSION: 1. New fracture through the superior endplate of the T9 vertebral body at the site of the mixed sclerotic and lucent lesion seen on prior CT and MRI exams, consistent with pathologic fracture. Further evaluation with MRI may be useful. 2. Patchy bilateral upper lobe nodular airspace disease consistent with inflammatory or infectious etiology. 3. Chronic areas of scarring or atelectasis within the left upper and left lower lobes, with stable elevation of the left hemidiaphragm. 4.  Aortic Atherosclerosis (ICD10-I70.0). Electronically Signed   By: Bobbye Burrow M.D.   On: 12/07/2023 16:39   DG Chest Portable 1 View Result Date: 12/07/2023 CLINICAL DATA:  Cough, back pain EXAM: PORTABLE CHEST - 1 VIEW COMPARISON:  08/09/2023 FINDINGS: Low lung volumes. Chronic linear scarring or atelectasis laterally in the left mid lung. No new infiltrate or overt edema. Heart size and mediastinal contours are within normal limits. Aortic Atherosclerosis (ICD10-170.0). CABG markers. No effusion. Sternotomy wires. IMPRESSION: Low volumes with chronic left mid lung scarring or atelectasis. Electronically Signed   By: Nicoletta Barrier M.D.   On: 12/07/2023 13:29    Microbiology: Results for orders placed or performed during the hospital encounter of 12/28/23  Resp panel  by RT-PCR (RSV, Flu A&B, Covid) Anterior Nasal Swab     Status: None   Collection Time: 12/28/23  8:15 AM   Specimen: Anterior Nasal Swab  Result Value Ref Range Status   SARS Coronavirus 2 by RT PCR NEGATIVE NEGATIVE Final    Comment: (NOTE) SARS-CoV-2 target nucleic acids are NOT DETECTED.  The SARS-CoV-2 RNA is generally detectable in upper  respiratory specimens during the acute phase of infection. The lowest concentration of SARS-CoV-2 viral copies this assay can detect is 138 copies/mL. A negative result does not preclude SARS-Cov-2 infection and should not be used as the sole basis for treatment or other patient management decisions. A negative result may occur with  improper specimen collection/handling, submission of specimen other than nasopharyngeal swab, presence of viral mutation(s) within the areas targeted by this assay, and inadequate number of viral copies(<138 copies/mL). A negative result must be combined with clinical observations, patient history, and epidemiological information. The expected result is Negative.  Fact Sheet for Patients:  BloggerCourse.com  Fact Sheet for Healthcare Providers:  SeriousBroker.it  This test is no t yet approved or cleared by the [place]  FDA and  has been authorized for detection and/or diagnosis of SARS-CoV-2 by FDA under an Emergency Use Authorization (EUA). This EUA will remain  in effect (meaning this test can be used) for the duration of the COVID-19 declaration under Section 564(b)(1) of the Act, 21 U.S.C.section 360bbb-3(b)(1), unless the authorization is terminated  or revoked sooner.       Influenza A by PCR NEGATIVE NEGATIVE Final   Influenza B by PCR NEGATIVE NEGATIVE Final    Comment: (NOTE) The Xpert Xpress SARS-CoV-2/FLU/RSV plus assay is intended as an aid in the diagnosis of influenza from Nasopharyngeal swab specimens and should not be used as a sole  basis for treatment. Nasal washings and aspirates are unacceptable for Xpert Xpress SARS-CoV-2/FLU/RSV testing.  Fact Sheet for Patients: BloggerCourse.com  Fact Sheet for Healthcare Providers: SeriousBroker.it  This test is not yet approved or cleared by the [place]  FDA and has been authorized for detection and/or diagnosis of SARS-CoV-2 by FDA under an Emergency Use Authorization (EUA). This EUA will remain in effect (meaning this test can be used) for the duration of the COVID-19 declaration under Section 564(b)(1) of the Act, 21 U.S.C. section 360bbb-3(b)(1), unless the authorization is terminated or revoked.     Resp Syncytial Virus by PCR NEGATIVE NEGATIVE Final    Comment: (NOTE) Fact Sheet for Patients: BloggerCourse.com  Fact Sheet for Healthcare Providers: SeriousBroker.it  This test is not yet approved or cleared by the [place]  FDA and has been authorized for detection and/or diagnosis of SARS-CoV-2 by FDA under an Emergency Use Authorization (EUA). This EUA will remain in effect (meaning this test can be used) for the duration of the COVID-19 declaration under Section 564(b)(1) of the Act, 21 U.S.C. section 360bbb-3(b)(1), unless the authorization is terminated or revoked.  Performed at [facility], [address], [place], [place] [address]   MRSA Next Gen by PCR, Nasal     Status: None   Collection Time: 12/28/23 11:48 AM   Specimen: Nasal Mucosa; Nasal Swab  Result Value Ref Range Status   MRSA by PCR Next Gen NOT DETECTED NOT DETECTED Final    Comment: (NOTE) The GeneXpert MRSA Assay (FDA approved for NASAL specimens only), is one component of a comprehensive MRSA colonization surveillance program. It is not intended to diagnose MRSA infection nor to guide or monitor treatment for MRSA infections. Test performance is not FDA approved in patients  less than 25 years old. Performed at [facility], [address], [place], [place] [address]   Respiratory (~20 pathogens) panel by PCR     Status: Abnormal   Collection Time: 12/28/23 11:48 AM   Specimen: Nasopharyngeal Swab; Respiratory  Result Value Ref Range Status   Adenovirus NOT DETECTED NOT DETECTED  Final   Coronavirus 229E NOT DETECTED NOT DETECTED Final    Comment: (NOTE) The Coronavirus on the Respiratory Panel, DOES NOT test for the novel  Coronavirus (2019 nCoV)    Coronavirus HKU1 NOT DETECTED NOT DETECTED Final   Coronavirus NL63 NOT DETECTED NOT DETECTED Final   Coronavirus OC43 NOT DETECTED NOT DETECTED Final   Metapneumovirus NOT DETECTED NOT DETECTED Final   Rhinovirus / Enterovirus DETECTED (A) NOT DETECTED Final   Influenza A NOT DETECTED NOT DETECTED Final   Influenza B NOT DETECTED NOT DETECTED Final   Parainfluenza Virus 1 NOT DETECTED NOT DETECTED Final   Parainfluenza Virus 2 NOT DETECTED NOT DETECTED Final   Parainfluenza Virus 3 NOT DETECTED NOT DETECTED Final   Parainfluenza Virus 4 NOT DETECTED NOT DETECTED Final   Respiratory Syncytial Virus NOT DETECTED NOT DETECTED Final   Bordetella pertussis NOT DETECTED NOT DETECTED Final   Bordetella Parapertussis NOT DETECTED NOT DETECTED Final   Chlamydophila pneumoniae NOT DETECTED NOT DETECTED Final   Mycoplasma pneumoniae NOT DETECTED NOT DETECTED Final    Comment: Performed at [facility], [address] [place], [place] [address]  Expectorated Sputum Assessment w Gram Stain, Rflx to Resp Cult     Status: None   Collection Time: 12/30/23  5:49 AM   Specimen: Sputum  Result Value Ref Range Status   Specimen Description SPUTUM  Final   Special Requests NONE  Final   Sputum evaluation   Final    THIS SPECIMEN IS ACCEPTABLE FOR SPUTUM CULTURE Performed at [facility], [address] [place], [place] [address]    Report Status 12/30/2023 FINAL  Final  Culture, Respiratory w Gram Stain     Status:  None   Collection Time: 12/30/23  5:49 AM   Specimen: SPU  Result Value Ref Range Status   Specimen Description   Final    SPUTUM Performed at [facility], [address], [place], [place] [address]    Special Requests   Final    NONE Reflexed from [id number] Performed at [facility], [address] [place], [place] [address]    Gram Stain   Final    ABUNDANT SQUAMOUS EPITHELIAL CELLS PRESENT WBC SEEN ABUNDANT GRAM NEGATIVE RODS ABUNDANT YEAST ABUNDANT GRAM POSITIVE COCCI    Culture   Final    ABUNDANT Normal respiratory flora-no Staph aureus or Pseudomonas seen Performed at [facility], [address] [place], [place] [address]    Report Status 01/01/2024 FINAL  Final    Labs: CBC: Recent Labs  Lab 12/30/23 0347 12/31/23 0811 12/31/23 1524 01/01/24 0417 01/02/24 0539 01/03/24 0407  WBC 11.2* 15.2*  --  13.9* 18.1* 13.1*  HGB 8.1* 8.2* 6.8* 8.0* 9.1* 8.2*  HCT 26.6* 26.0* 21.0* 24.6* 28.9* 25.8*  MCV 80.6 81.3  --  82.8 83.5 82.4  PLT 381 580*  --  401* 456* 398   Basic Metabolic Panel: Recent Labs  Lab 12/29/23 0352 12/30/23 0347 12/31/23 0413 01/01/24 0417 01/02/24 0539 01/03/24 0407  NA 135 134* 134* 135 138 133*  K 3.6 4.0 4.5 4.8 4.8 3.9  CL 104 106 105 108 104 103  CO2 20* 20* 22 21* 20* 21*  GLUCOSE 138* 101* 239* 189* 225* 230*  BUN 38* 38* 57* 72* 62* 56*  CREATININE 2.85* 2.55* 2.34* 2.43* 2.47* 2.10*  CALCIUM  8.7* 8.4* 9.1 8.7* 9.5 8.6*  MG 2.2 2.1 2.3 2.2 2.4  --   PHOS  --  4.0  3.0 3.9  --   --    Liver Function Tests: Recent Labs  Lab 01/01/24 0417 01/02/24 0539  AST 16 22  ALT 10 15  ALKPHOS 63 72  BILITOT 0.8 0.8  PROT 6.0* 6.9  ALBUMIN  2.8* 3.2*   CBG: Recent Labs  Lab 01/02/24 1109 01/02/24 1615 01/02/24 2101 01/03/24 0748 01/03/24 1111  GLUCAP 239* 150* 269* 228* 321*    Discharge time spent: greater than 30 minutes.  Signed: Wynetta Heckle, MD [facility] 01/03/2024

## 2024-01-03 NOTE — Inpatient Diabetes Management (Signed)
 Inpatient Diabetes Program Recommendations  AACE/ADA: New Consensus Statement on Inpatient Glycemic Control   Target Ranges:  Prepandial:   less than 140 mg/dL      Peak postprandial:   less than 180 mg/dL (1-2 hours)      Critically ill patients:  140 - 180 mg/dL    Latest Reference Range & Units 01/02/24 07:13 01/02/24 11:09 01/02/24 16:15 01/02/24 21:01 01/03/24 07:48  Glucose-Capillary 70 - 99 mg/dL 253 (H) 664 (H) 403 (H) 269 (H) 228 (H)   Review of Glycemic Control  Diabetes history: DM2 Outpatient Diabetes medications: Lantus  20 units at bedtime, Jardiance  12.5 mg daily Current orders for Inpatient glycemic control: Semglee  8 units at bedtime, Novolog  0-9 units TID with meals, Novolog  0-5 units at bedtime, Novolog  3 units TID with meals; Solumedrol 40 mg Q12H   Inpatient Diabetes Program Recommendations:     Insulin : If steroids are continued as ordered, please consider increasing Semglee  to 12 units at bedtime and meal coverage to Novolog  4 units TID with meals.   Thanks, Beacher Limerick, RN, MSN, CDCES Diabetes Coordinator Inpatient Diabetes Program [phone] (Team Pager from 8am to 5pm)

## 2024-01-03 NOTE — Progress Notes (Signed)
 Patient fell with facial trauma, ordering CT head per protocol.

## 2024-01-03 NOTE — Progress Notes (Signed)
 Pt was found sitting in the floor of his room with a busted lip, pt stated he stood up to use his urinal and got tangled in his gown and fell trying to urinate. Pt had on small facial injury on his lip. Vitals taken, Press photographer and MD was notified and orders for stat CT of the head completed. PT resting well at this time and with no pain or complaints.  Bed alarm is now set and fall mats placed at bedside. Pt instructed to use call bell for assistance when needing to ambulate.

## 2024-01-03 NOTE — Plan of Care (Signed)
  Problem: Education: Goal: Knowledge of General Education information will improve Description: Including pain rating scale, medication(s)/side effects and non-pharmacologic comfort measures Outcome: Progressing   Problem: Clinical Measurements: Goal: Ability to maintain clinical measurements within normal limits will improve Outcome: Progressing Goal: Will remain free from infection Outcome: Progressing Goal: Diagnostic test results will improve Outcome: Progressing Goal: Respiratory complications will improve Outcome: Progressing Goal: Cardiovascular complication will be avoided Outcome: Progressing   Problem: Activity: Goal: Risk for activity intolerance will decrease Outcome: Progressing   Problem: Safety: Goal: Ability to remain free from injury will improve Outcome: Progressing   Problem: Skin Integrity: Goal: Risk for impaired skin integrity will decrease Outcome: Progressing   Problem: Education: Goal: Ability to describe self-care measures that may prevent or decrease complications (Diabetes Survival Skills Education) will improve Outcome: Progressing Goal: Individualized Educational Video(s) Outcome: Progressing   Problem: Coping: Goal: Ability to adjust to condition or change in health will improve Outcome: Progressing   Problem: Fluid Volume: Goal: Ability to maintain a balanced intake and output will improve Outcome: Progressing   Problem: Health Behavior/Discharge Planning: Goal: Ability to identify and utilize available resources and services will improve Outcome: Progressing Goal: Ability to manage health-related needs will improve Outcome: Progressing   Problem: Metabolic: Goal: Ability to maintain appropriate glucose levels will improve Outcome: Progressing   Problem: Nutritional: Goal: Maintenance of adequate nutrition will improve Outcome: Progressing

## 2024-01-08 ENCOUNTER — Ambulatory Visit (INDEPENDENT_AMBULATORY_CARE_PROVIDER_SITE_OTHER): Payer: Self-pay | Admitting: Gastroenterology

## 2024-01-08 ENCOUNTER — Inpatient Hospital Stay: Admitting: Hematology

## 2024-01-09 ENCOUNTER — Inpatient Hospital Stay: Admitting: Licensed Clinical Social Worker

## 2024-01-09 DIAGNOSIS — C7951 Secondary malignant neoplasm of bone: Secondary | ICD-10-CM

## 2024-01-09 NOTE — Progress Notes (Signed)
 [facility] CSW Progress Note  Visual merchandiser had a phone visit scheduled w/ pt today to assess.  CSW attempted to reach pt by phone w/ no answer.  Voicemail left for pt providing contact details for CSW.       Quenton Bruns, LCSW Clinical Social Worker [facility]

## 2024-01-11 ENCOUNTER — Telehealth: Payer: Self-pay | Admitting: Hematology

## 2024-01-11 NOTE — Telephone Encounter (Signed)
 [place] APPROVAL/ONCOLOGY REF # [id number] 12/30/23-06/27/24

## 2024-01-13 ENCOUNTER — Emergency Department (HOSPITAL_COMMUNITY)

## 2024-01-13 ENCOUNTER — Encounter (HOSPITAL_COMMUNITY): Payer: Self-pay

## 2024-01-13 ENCOUNTER — Inpatient Hospital Stay (HOSPITAL_COMMUNITY)
Admission: EM | Admit: 2024-01-13 | Discharge: 2024-01-17 | DRG: 988 | Disposition: A | Discharge status: Home / Self Care | Attending: Internal Medicine | Admitting: Internal Medicine

## 2024-01-13 ENCOUNTER — Other Ambulatory Visit: Payer: Self-pay

## 2024-01-13 DIAGNOSIS — I739 Peripheral vascular disease, unspecified: Secondary | ICD-10-CM | POA: Diagnosis not present

## 2024-01-13 DIAGNOSIS — Z800 Family history of malignant neoplasm of digestive organs: Secondary | ICD-10-CM

## 2024-01-13 DIAGNOSIS — A419 Sepsis, unspecified organism: Principal | ICD-10-CM

## 2024-01-13 DIAGNOSIS — E1151 Type 2 diabetes mellitus with diabetic peripheral angiopathy without gangrene: Secondary | ICD-10-CM | POA: Diagnosis present

## 2024-01-13 DIAGNOSIS — J690 Pneumonitis due to inhalation of food and vomit: Principal | ICD-10-CM | POA: Diagnosis present

## 2024-01-13 DIAGNOSIS — I25810 Atherosclerosis of coronary artery bypass graft(s) without angina pectoris: Secondary | ICD-10-CM | POA: Diagnosis present

## 2024-01-13 DIAGNOSIS — J440 Chronic obstructive pulmonary disease with acute lower respiratory infection: Secondary | ICD-10-CM | POA: Diagnosis present

## 2024-01-13 DIAGNOSIS — C7951 Secondary malignant neoplasm of bone: Secondary | ICD-10-CM | POA: Diagnosis present

## 2024-01-13 DIAGNOSIS — Z95820 Peripheral vascular angioplasty status with implants and grafts: Secondary | ICD-10-CM | POA: Diagnosis not present

## 2024-01-13 DIAGNOSIS — E1165 Type 2 diabetes mellitus with hyperglycemia: Secondary | ICD-10-CM | POA: Diagnosis present

## 2024-01-13 DIAGNOSIS — Y95 Nosocomial condition: Secondary | ICD-10-CM | POA: Diagnosis present

## 2024-01-13 DIAGNOSIS — E782 Mixed hyperlipidemia: Secondary | ICD-10-CM | POA: Diagnosis not present

## 2024-01-13 DIAGNOSIS — Z823 Family history of stroke: Secondary | ICD-10-CM

## 2024-01-13 DIAGNOSIS — I2510 Atherosclerotic heart disease of native coronary artery without angina pectoris: Secondary | ICD-10-CM | POA: Diagnosis present

## 2024-01-13 DIAGNOSIS — I129 Hypertensive chronic kidney disease with stage 1 through stage 4 chronic kidney disease, or unspecified chronic kidney disease: Secondary | ICD-10-CM | POA: Diagnosis present

## 2024-01-13 DIAGNOSIS — E1142 Type 2 diabetes mellitus with diabetic polyneuropathy: Secondary | ICD-10-CM | POA: Diagnosis present

## 2024-01-13 DIAGNOSIS — Z8249 Family history of ischemic heart disease and other diseases of the circulatory system: Secondary | ICD-10-CM | POA: Diagnosis not present

## 2024-01-13 DIAGNOSIS — Z9861 Coronary angioplasty status: Secondary | ICD-10-CM

## 2024-01-13 DIAGNOSIS — Z860100 Personal history of colon polyps, unspecified: Secondary | ICD-10-CM

## 2024-01-13 DIAGNOSIS — J189 Pneumonia, unspecified organism: Secondary | ICD-10-CM | POA: Diagnosis present

## 2024-01-13 DIAGNOSIS — E7800 Pure hypercholesterolemia, unspecified: Secondary | ICD-10-CM | POA: Diagnosis present

## 2024-01-13 DIAGNOSIS — Z794 Long term (current) use of insulin: Secondary | ICD-10-CM

## 2024-01-13 DIAGNOSIS — G629 Polyneuropathy, unspecified: Secondary | ICD-10-CM

## 2024-01-13 DIAGNOSIS — Z7902 Long term (current) use of antithrombotics/antiplatelets: Secondary | ICD-10-CM | POA: Diagnosis not present

## 2024-01-13 DIAGNOSIS — Z7951 Long term (current) use of inhaled steroids: Secondary | ICD-10-CM

## 2024-01-13 DIAGNOSIS — Z1152 Encounter for screening for COVID-19: Secondary | ICD-10-CM | POA: Diagnosis not present

## 2024-01-13 DIAGNOSIS — Z87891 Personal history of nicotine dependence: Secondary | ICD-10-CM

## 2024-01-13 DIAGNOSIS — I10 Essential (primary) hypertension: Secondary | ICD-10-CM | POA: Diagnosis present

## 2024-01-13 DIAGNOSIS — K219 Gastro-esophageal reflux disease without esophagitis: Secondary | ICD-10-CM | POA: Diagnosis present

## 2024-01-13 DIAGNOSIS — Z888 Allergy status to other drugs, medicaments and biological substances status: Secondary | ICD-10-CM

## 2024-01-13 DIAGNOSIS — Z951 Presence of aortocoronary bypass graft: Secondary | ICD-10-CM | POA: Diagnosis not present

## 2024-01-13 DIAGNOSIS — Z7982 Long term (current) use of aspirin: Secondary | ICD-10-CM

## 2024-01-13 DIAGNOSIS — Z8673 Personal history of transient ischemic attack (TIA), and cerebral infarction without residual deficits: Secondary | ICD-10-CM | POA: Diagnosis not present

## 2024-01-13 DIAGNOSIS — Z7984 Long term (current) use of oral hypoglycemic drugs: Secondary | ICD-10-CM

## 2024-01-13 DIAGNOSIS — E785 Hyperlipidemia, unspecified: Secondary | ICD-10-CM | POA: Diagnosis present

## 2024-01-13 DIAGNOSIS — G603 Idiopathic progressive neuropathy: Secondary | ICD-10-CM | POA: Diagnosis not present

## 2024-01-13 DIAGNOSIS — Z9049 Acquired absence of other specified parts of digestive tract: Secondary | ICD-10-CM

## 2024-01-13 DIAGNOSIS — N184 Chronic kidney disease, stage 4 (severe): Secondary | ICD-10-CM | POA: Insufficient documentation

## 2024-01-13 DIAGNOSIS — E1122 Type 2 diabetes mellitus with diabetic chronic kidney disease: Secondary | ICD-10-CM | POA: Diagnosis present

## 2024-01-13 DIAGNOSIS — Z79899 Other long term (current) drug therapy: Secondary | ICD-10-CM

## 2024-01-13 LAB — CBC WITH DIFFERENTIAL/PLATELET
Abs Immature Granulocytes: 0.43 10*3/uL — ABNORMAL HIGH (ref 0.00–0.07)
Basophils Absolute: 0.1 10*3/uL (ref 0.0–0.1)
Basophils Relative: 0 %
Eosinophils Absolute: 0 10*3/uL (ref 0.0–0.5)
Eosinophils Relative: 0 %
HCT: 24.5 % — ABNORMAL LOW (ref 39.0–52.0)
Hemoglobin: 8.2 g/dL — ABNORMAL LOW (ref 13.0–17.0)
Immature Granulocytes: 1 %
Lymphocytes Relative: 2 %
Lymphs Abs: 0.7 10*3/uL (ref 0.7–4.0)
MCH: 27.5 pg (ref 26.0–34.0)
MCHC: 33.5 g/dL (ref 30.0–36.0)
MCV: 82.2 fL (ref 80.0–100.0)
Monocytes Absolute: 2 10*3/uL — ABNORMAL HIGH (ref 0.1–1.0)
Monocytes Relative: 6 %
Neutro Abs: 31.3 10*3/uL — ABNORMAL HIGH (ref 1.7–7.7)
Neutrophils Relative %: 91 %
Platelets: 356 10*3/uL (ref 150–400)
RBC: 2.98 MIL/uL — ABNORMAL LOW (ref 4.22–5.81)
RDW: 17.9 % — ABNORMAL HIGH (ref 11.5–15.5)
WBC: 34.4 10*3/uL — ABNORMAL HIGH (ref 4.0–10.5)
nRBC: 0 % (ref 0.0–0.2)

## 2024-01-13 LAB — COMPREHENSIVE METABOLIC PANEL WITH GFR
ALT: 13 U/L (ref 0–44)
AST: 12 U/L — ABNORMAL LOW (ref 15–41)
Albumin: 2.8 g/dL — ABNORMAL LOW (ref 3.5–5.0)
Alkaline Phosphatase: 81 U/L (ref 38–126)
Anion gap: 10 (ref 5–15)
BUN: 32 mg/dL — ABNORMAL HIGH (ref 8–23)
CO2: 22 mmol/L (ref 22–32)
Calcium: 8.2 mg/dL — ABNORMAL LOW (ref 8.9–10.3)
Chloride: 99 mmol/L (ref 98–111)
Creatinine, Ser: 2.37 mg/dL — ABNORMAL HIGH (ref 0.61–1.24)
GFR, Estimated: 28 mL/min — ABNORMAL LOW (ref >60)
Glucose, Bld: 105 mg/dL — ABNORMAL HIGH (ref 70–99)
Potassium: 4.2 mmol/L (ref 3.5–5.1)
Sodium: 131 mmol/L — ABNORMAL LOW (ref 135–145)
Total Bilirubin: 0.8 mg/dL (ref 0.0–1.2)
Total Protein: 5.9 g/dL — ABNORMAL LOW (ref 6.5–8.1)

## 2024-01-13 LAB — URINALYSIS, W/ REFLEX TO CULTURE (INFECTION SUSPECTED)
Bacteria, UA: NONE SEEN
Bilirubin Urine: NEGATIVE
Glucose, UA: >=500 mg/dL — AB
Hgb urine dipstick: NEGATIVE
Ketones, ur: NEGATIVE mg/dL
Leukocytes,Ua: NEGATIVE
Nitrite: NEGATIVE
Protein, ur: 30 mg/dL — AB
Specific Gravity, Urine: 1.009 (ref 1.005–1.030)
pH: 7 (ref 5.0–8.0)

## 2024-01-13 LAB — LACTIC ACID, PLASMA: Lactic Acid, Venous: 1 mmol/L (ref 0.5–1.9)

## 2024-01-13 LAB — MRSA NEXT GEN BY PCR, NASAL: MRSA by PCR Next Gen: NOT DETECTED

## 2024-01-13 LAB — GLUCOSE, CAPILLARY: Glucose-Capillary: 170 mg/dL — ABNORMAL HIGH (ref 70–99)

## 2024-01-13 LAB — RESP PANEL BY RT-PCR (RSV, FLU A&B, COVID)  RVPGX2
Influenza A by PCR: NEGATIVE
Influenza B by PCR: NEGATIVE
Resp Syncytial Virus by PCR: NEGATIVE
SARS Coronavirus 2 by RT PCR: NEGATIVE

## 2024-01-13 LAB — PROTIME-INR
INR: 1.1 (ref 0.8–1.2)
Prothrombin Time: 14.8 s (ref 11.4–15.2)

## 2024-01-13 MED ORDER — GABAPENTIN 300 MG PO CAPS
300.0000 mg | ORAL_CAPSULE | Freq: Two times a day (BID) | ORAL | Status: DC
  Administered 2024-01-13 – 2024-01-17 (×8): 300 mg via ORAL
  Filled 2024-01-13 (×8): qty 1

## 2024-01-13 MED ORDER — PANTOPRAZOLE SODIUM 40 MG PO TBEC
40.0000 mg | DELAYED_RELEASE_TABLET | Freq: Two times a day (BID) | ORAL | Status: DC
  Administered 2024-01-13 – 2024-01-17 (×8): 40 mg via ORAL
  Filled 2024-01-13 (×8): qty 1

## 2024-01-13 MED ORDER — VANCOMYCIN HCL IN DEXTROSE 1-5 GM/200ML-% IV SOLN: 1000.0000 mg | Freq: Once | INTRAVENOUS | Status: DC

## 2024-01-13 MED ORDER — VANCOMYCIN HCL 1500 MG/300ML IV SOLN
1500.0000 mg | Freq: Once | INTRAVENOUS | Status: AC
  Administered 2024-01-13: 1500 mg via INTRAVENOUS
  Filled 2024-01-13: qty 300

## 2024-01-13 MED ORDER — SODIUM CHLORIDE 0.9 % IV SOLN
1.0000 g | Freq: Once | INTRAVENOUS | Status: AC
  Administered 2024-01-13: 1 g via INTRAVENOUS
  Filled 2024-01-13: qty 20

## 2024-01-13 MED ORDER — METOPROLOL TARTRATE 25 MG PO TABS
25.0000 mg | ORAL_TABLET | Freq: Two times a day (BID) | ORAL | Status: DC
  Administered 2024-01-13 – 2024-01-17 (×8): 25 mg via ORAL
  Filled 2024-01-13 (×8): qty 1

## 2024-01-13 MED ORDER — INSULIN ASPART 100 UNIT/ML IJ SOLN
0.0000 [IU] | INTRAMUSCULAR | Status: DC
  Administered 2024-01-13 – 2024-01-14 (×3): 2 [IU] via SUBCUTANEOUS
  Administered 2024-01-14: 7 [IU] via SUBCUTANEOUS
  Administered 2024-01-14: 5 [IU] via SUBCUTANEOUS
  Administered 2024-01-15: 3 [IU] via SUBCUTANEOUS
  Administered 2024-01-15: 2 [IU] via SUBCUTANEOUS
  Administered 2024-01-15: 3 [IU] via SUBCUTANEOUS
  Administered 2024-01-15: 7 [IU] via SUBCUTANEOUS
  Administered 2024-01-16 (×2): 1 [IU] via SUBCUTANEOUS
  Administered 2024-01-16: 9 [IU] via SUBCUTANEOUS
  Administered 2024-01-16: 5 [IU] via SUBCUTANEOUS
  Administered 2024-01-16: 3 [IU] via SUBCUTANEOUS
  Administered 2024-01-17: 2 [IU] via SUBCUTANEOUS

## 2024-01-13 MED ORDER — LOSARTAN POTASSIUM 50 MG PO TABS
50.0000 mg | ORAL_TABLET | Freq: Every day | ORAL | Status: DC
  Administered 2024-01-13 – 2024-01-17 (×5): 50 mg via ORAL
  Filled 2024-01-13 (×5): qty 1

## 2024-01-13 MED ORDER — ALBUTEROL SULFATE (2.5 MG/3ML) 0.083% IN NEBU: 3.0000 mL | INHALATION_SOLUTION | Freq: Four times a day (QID) | RESPIRATORY_TRACT | Status: DC | PRN

## 2024-01-13 MED ORDER — LACTATED RINGERS IV BOLUS
500.0000 mL | Freq: Once | INTRAVENOUS | Status: AC
  Administered 2024-01-13: 500 mL via INTRAVENOUS

## 2024-01-13 MED ORDER — VITAMIN B-12 1000 MCG PO TABS
1000.0000 ug | ORAL_TABLET | Freq: Every morning | ORAL | Status: DC
  Administered 2024-01-14 – 2024-01-17 (×4): 1000 ug via ORAL
  Filled 2024-01-13 (×4): qty 1

## 2024-01-13 MED ORDER — DM-GUAIFENESIN ER 30-600 MG PO TB12
1.0000 | ORAL_TABLET | Freq: Two times a day (BID) | ORAL | Status: DC
  Administered 2024-01-13 – 2024-01-17 (×8): 1 via ORAL
  Filled 2024-01-13 (×8): qty 1

## 2024-01-13 MED ORDER — AMLODIPINE BESYLATE 5 MG PO TABS
10.0000 mg | ORAL_TABLET | Freq: Every morning | ORAL | Status: DC
  Administered 2024-01-14 – 2024-01-17 (×4): 10 mg via ORAL
  Filled 2024-01-13 (×4): qty 2

## 2024-01-13 MED ORDER — SODIUM CHLORIDE 0.9 % IV SOLN
1.0000 g | Freq: Two times a day (BID) | INTRAVENOUS | Status: DC
  Administered 2024-01-14: 1 g via INTRAVENOUS
  Filled 2024-01-13: qty 20

## 2024-01-13 MED ORDER — LACTATED RINGERS IV BOLUS
1000.0000 mL | Freq: Once | INTRAVENOUS | Status: AC
  Administered 2024-01-13: 1000 mL via INTRAVENOUS

## 2024-01-13 MED ORDER — LACTATED RINGERS IV SOLN
INTRAVENOUS | Status: AC
  Administered 2024-01-14: 75 mL/h via INTRAVENOUS

## 2024-01-13 MED ORDER — ENOXAPARIN SODIUM 30 MG/0.3ML IJ SOSY
30.0000 mg | PREFILLED_SYRINGE | INTRAMUSCULAR | Status: DC
  Administered 2024-01-13: 30 mg via SUBCUTANEOUS
  Filled 2024-01-13: qty 0.3

## 2024-01-13 NOTE — Progress Notes (Signed)
 PHARMACY NOTE:  ANTIMICROBIAL RENAL DOSAGE ADJUSTMENT  Current antimicrobial regimen includes a mismatch between antimicrobial dosage and estimated renal function.  As per policy approved by the Pharmacy & Therapeutics and Medical Executive Committees, the antimicrobial dosage will be adjusted accordingly.  Current antimicrobial dosage:  Meropenem 1g IV q8H  Indication: HAP  Renal Function:  Estimated Creatinine Clearance: 23.9 mL/min (A) (by C-G formula based on SCr of 2.37 mg/dL (H)).    Antimicrobial dosage has been changed to:  Meropenem 1g IV q12H  Additional comments:   Thank you for allowing pharmacy to be a part of this patient's care.  Will M. Alva Jewels, PharmD Clinical Pharmacist 01/13/2024 8:05 PM

## 2024-01-13 NOTE — H&P (Signed)
 History and Physical:    TAHJAI Dunn   WUJ:[id number] DOB: [birth year] DOA: 01/13/2024  Referring MD/provider: Pearletha Bouche PCP: Clinic, [name]   Patient coming from: Home  Chief Complaint: Fever/chills/cough  History of Present Illness:   [name] is an 78 y.o. male with a history of coronary artery disease status post CABG, hypertension, diabetes mellitus type 2, hyperlipidemia, CKD stage IV, diverticulitis status post colostomy and subsequent takedown, atrial tachycardia, peripheral vascular disease, TIA who was recently in the hospital from 12/28/2023 - 01/03/2024 for treatment of pneumonia.  Prior to that, he was also in the hospital 12/07/2023 - 12/10/2023 for pneumonia and was discharged on levofloxacin .  During that admission, a CT scan of the chest done on 12/10/2023 showed a pathological fracture of the T9 vertebra and an MRI scan subsequently showed lytic bone lesions involving the thoracic, lumbar, and sacral bones.  He was referred to Dr. Katragadda for follow-up who performed a biopsy of the T9 vertebral lesion on 12/26/2023 but unfortunately no tissue diagnosis could be made from that biopsy.  His family also tells me that imaging studies have shown that he has had a stroke though he never had symptoms of a stroke.  A multiple myeloma panel done on 12/07/2023 showed elevated alpha-2 globulin at 1.3 and an M spike was not observed.  Urine protein electrophoresis on 12/07/2023 was normal.  Tumor markers including AFP, CA 19-9 and CEA were not remarkable and his PSA was most recently 3.89.  His kappa and lambda free light chains were elevated but the kappa/lambda ratio was normal on 12/18/2023.  Dr. Cheree Cords is planning to do a bone marrow biopsy next week.  Since his most recent discharge from the hospital on 01/03/2024, the patient has not cleared his symptoms.  He continues to complain of cough productive of thick sputum but without color, fever and chills, and general  malaise.  Family also reports some intermittent confusion.  ED Course:  [name] vital signs were: Blood pressure (!) 158/60, pulse 84, temperature 98.4 F (36.9 C), temperature source Oral, resp. rate 17, height 5\' 7"  (1.702 m), weight 68 kg, SpO2 92%.  Chest x-ray showed increased patchy opacities at the left mid to upper lung zone; similar streaky opacities in the right upper lung and slightly increased streaky right basilar opacities concerning for multifocal pneumonia versus aspiration/edema.  Respiratory panel was negative for COVID and influenza.  MRSA nasal screening was negative.  Lactic acid was normal.  Chemistries were notable for sodium of 131, BUN 32, creatinine 2.37 (consistent with his normal baseline value) corresponding to a GFR of 28.  WBC was notable for an elevated level of 34.4 (was 13.110 days prior) with a shift to the left. Mr. Sinning was given 1.5 L of lactated Ringer 's and he was started on meropenem and vancomycin  to cover hospital-acquired pathogens.   ROS:   Review of Systems  Constitutional:  Positive for chills, fever and malaise/fatigue.  HENT:  Positive for hearing loss.   Eyes: Negative.   Respiratory:  Positive for cough, sputum production and shortness of breath.   Cardiovascular: Negative.   Gastrointestinal: Negative.   Genitourinary: Negative.   Musculoskeletal: Negative.   Skin:        Easy bruising  Neurological: Negative.   Endo/Heme/Allergies:  Bruises/bleeds easily.  Psychiatric/Behavioral: Negative.      Past Medical History:   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   x 5   Hypercholesteremia     Hypertension    Neuropathy    Peripheral vascular disease (HCC)    s/p R-L FEM-FEM BYPASS   Pneumonia    Tachycardia    after CABG, pt on medicine for this    Past Surgical History:   Past Surgical History:  Procedure Laterality Date   ABDOMINAL AORTOGRAM Bilateral 09/19/2016   Procedure: iliac;  Surgeon: Margherita Shell, MD;  [facility];  Service: Cardiovascular;  Laterality: Bilateral;   ABDOMINAL AORTOGRAM W/LOWER EXTREMITY N/A 09/22/2021   Procedure: ABDOMINAL AORTOGRAM W/LOWER EXTREMITY;  Surgeon: Young Hensen, MD;  [facility]: [facility];  Service: Cardiovascular;  Laterality: N/A;   ABDOMINAL AORTOGRAM W/LOWER EXTREMITY N/A 10/05/2022   Procedure: ABDOMINAL AORTOGRAM W/LOWER EXTREMITY;  Surgeon: Young Hensen, MD;  [facility]: [facility];  Service: Cardiovascular;  Laterality: N/A;   BIOPSY  05/16/2023   Procedure: BIOPSY;  Surgeon: Suzette Espy, MD;  [facility];  Service: Endoscopy;;   CARDIAC CATHETERIZATION  2003   with stent   CARDIAC CATHETERIZATION N/A 10/11/2015   Procedure: Left Heart Cath and Coronary Angiography;  Surgeon: Peter M Swaziland, MD;  Location: [facility] INVASIVE CV LAB;  Service: Cardiovascular;  Laterality: N/A;   COLON RESECTION N/A 12/13/2017   Procedure: EXPLORATORY LAPAROTOMY, SIGMOID COLECTOMY WITH COLOSTOMY;  Surgeon: Dareen Ebbing, MD;  [facility]: [facility];  Service: General;  Laterality: N/A;   COLONOSCOPY N/A 09/22/2013   Procedure: COLONOSCOPY;  Surgeon: Suzette Espy, MD;  [facility];  Service: Endoscopy;  Laterality: N/A;  9:30 AM   COLONOSCOPY N/A 02/19/2018   Procedure: COLONOSCOPY;  Surgeon: Suzette Espy, MD;  [facility];  Service: Endoscopy;  Laterality: N/A;  2:00pm   COLONOSCOPY WITH PROPOFOL  N/A 05/16/2023   Procedure: COLONOSCOPY WITH PROPOFOL ;  Surgeon: Suzette Espy, MD;  [facility];  Service: Endoscopy;  Laterality: N/A;  730am, asa 3    COLOSTOMY N/A 12/13/2017   Procedure: COLOSTOMY;  Surgeon: Dareen Ebbing, MD;  Location: [facility] OR;  Service: General;  Laterality: N/A;   COLOSTOMY REVERSAL N/A 03/26/2018   Procedure: COLOSTOMY REVERSAL;  Surgeon: Dareen Ebbing, MD;  [facility];  Service: General;  Laterality: N/A;   CORONARY ARTERY BYPASS GRAFT N/A 10/18/2015   Procedure: CORONARY ARTERY BYPASS GRAFTING (CABG) x  five, using left internal mammary artery and right leg greater saphenous vein harvested endoscopically;  Surgeon: Zelphia Higashi, MD;  Location: [facility];  Service: Open Heart Surgery;  Laterality: N/A;   ENDARTERECTOMY FEMORAL Right 08/09/2016   Procedure: ENDARTERECTOMY FEMORAL WITH VEIN PATCH ANGIOPLASTY;  Surgeon: Margherita Shell, MD;  [facility];  Service: Vascular;  Laterality: Right;   ESOPHAGOGASTRODUODENOSCOPY N/A 10/24/2017   Dr. Riley Cheadle: hiatal hernia   ESOPHAGOGASTRODUODENOSCOPY N/A 01/01/2024   Procedure: EGD (ESOPHAGOGASTRODUODENOSCOPY);  Surgeon: Hargis Lias, MD;  [facility];  Service: Endoscopy;  Laterality: N/A;   ESOPHAGOGASTRODUODENOSCOPY (EGD) WITH PROPOFOL  N/A 05/16/2023   Procedure: ESOPHAGOGASTRODUODENOSCOPY (EGD) WITH PROPOFOL ;  Surgeon: Suzette Espy, MD;  [facility];  Service: Endoscopy;  Laterality: N/A;  FEMORAL-FEMORAL BYPASS GRAFT Bilateral 08/09/2016   Procedure: REVISION BYPASS GRAFT RIGHT FEMORAL-LEFT FEMORAL ARTERY;  Surgeon: Margherita Shell, MD;  Location: [facility] OR;  Service: Vascular;  Laterality: Bilateral;   FEMORAL-POPLITEAL BYPASS GRAFT     INTRAMEDULLARY (IM) NAIL INTERTROCHANTERIC Left 04/03/2021   Procedure: LEFT  HIP INTRAMEDULLARY (IM) NAIL INTERTROCHANTRIC;  Surgeon: Arnie Lao, MD;  [facility];  Service: Orthopedics;  Laterality: Left;   IR FLUORO GUIDED NEEDLE PLC ASPIRATION/INJECTION LOC  12/26/2023   MALONEY DILATION N/A 05/16/2023   Procedure: MALONEY DILATION;  Surgeon: Suzette Espy, MD;  [facility];  Service: Endoscopy;  Laterality: N/A;   PERIPHERAL VASCULAR BALLOON ANGIOPLASTY Right 09/22/2021   Procedure: PERIPHERAL VASCULAR BALLOON ANGIOPLASTY;  Surgeon: Young Hensen, MD;  [facility];  Service: Cardiovascular;  Laterality: Right;   PERIPHERAL VASCULAR CATHETERIZATION N/A 05/08/2016   Procedure: Lower Extremity Angiography;  Surgeon: Avanell Leigh, MD;  Location: [facility] INVASIVE CV LAB;  Service: Cardiovascular;  Laterality: N/A;   PERIPHERAL VASCULAR INTERVENTION Right 09/19/2016   Procedure: Peripheral Vascular Intervention;  Surgeon: Margherita Shell, MD;  [facility];  Service: Cardiovascular;  Laterality: Right;  ext iliac stent   PERIPHERAL VASCULAR INTERVENTION Right 09/22/2021   Procedure: PERIPHERAL VASCULAR INTERVENTION;  Surgeon: Young Hensen, MD;  [facility];  Service: Cardiovascular;  Laterality: Right;   POLYPECTOMY  02/19/2018   Procedure: POLYPECTOMY;  Surgeon: Suzette Espy, MD;  [facility];  Service: Endoscopy;;   PROCTOSCOPY  10/24/2017   Procedure: PROCTOSCOPY;  Surgeon: Suzette Espy, MD;  [facility];  Service: Endoscopy;;   TEE WITHOUT CARDIOVERSION N/A 10/18/2015   Procedure: TRANSESOPHAGEAL ECHOCARDIOGRAM (TEE);  Surgeon: Zelphia Higashi, MD;  Location: [facility] OR;  Service: Open Heart Surgery;  Laterality: N/A;    Social History:   Social History   Socioeconomic History   Marital status: Married    Spouse name: Not on file   Number of children: Not on file   Years of education: Not on file   Highest education level: Not on file  Occupational History   Not on file  Tobacco Use   Smoking status: Former    Current packs/day: 0.00    Average packs/day: 2.0 packs/day for 30.0 years (60.0 ttl pk-yrs)    Types: Cigarettes    Start date: 08/14/1970    Quit date: 08/14/2000    Years since quitting: 23.4   Smokeless tobacco: Never  Vaping Use   Vaping  status: Never Used  Substance and Sexual Activity   Alcohol  use: No   Drug use: No   Sexual activity: Not Currently  Other Topics Concern   Not on file  Social History Narrative   Not on file   Social Drivers of Health   Financial Resource Strain: Low Risk  (10/08/2023)   Received from [facility]   Overall Financial Resource Strain (CARDIA)    Difficulty of Paying Living Expenses: Not very hard  Food Insecurity: No Food Insecurity (12/28/2023)   Hunger Vital Sign    Worried About Running Out of Food in the Last Year: Never true    Ran Out of Food in the Last Year: Never true  Recent Concern: Food Insecurity - Food Insecurity Present (12/18/2023)   Hunger Vital Sign    Worried About Running Out of Food in the Last Year: Sometimes true    Ran Out of Food in  the Last Year: Sometimes true  Transportation Needs: No Transportation Needs (12/28/2023)   PRAPARE - Administrator, Civil Service (Medical): No    Lack of Transportation (Non-Medical): No  Physical Activity: Not on file  Stress: Not on file  Social Connections: Socially Integrated (12/28/2023)   Social Connection and Isolation Panel [NHANES]    Frequency of Communication with Friends and Family: More than three times a week    Frequency of Social Gatherings with Friends and Family: Three times a week    Attends Religious Services: More than 4 times per year    Active Member of Clubs or Organizations: Yes    Attends Banker Meetings: 1 to 4 times per year    Marital Status: Married  Catering manager Violence: Not At Risk (12/28/2023)   Humiliation, Afraid, Rape, and Kick questionnaire    Fear of Current or Ex-Partner: No    Emotionally Abused: No    Physically Abused: No    Sexually Abused: No    Allergies   Acarbose  Family history:   Family History  Problem Relation Age of Onset   Heart attack Mother    Stroke Mother    Colon cancer Brother        late 36s    Current Medications:    Prior to Admission medications   Medication Sig Start Date End Date Taking? Authorizing Provider  albuterol  (VENTOLIN  HFA) 108 (90 Base) MCG/ACT inhaler Inhale 2 puffs into the lungs every 6 (six) hours as needed for wheezing or shortness of breath. 01/03/24  Yes Wynetta Heckle, MD  amLODipine  (NORVASC ) 10 MG tablet Take 10 mg by mouth in the morning.   Yes [provider]  benzonatate (TESSALON) 200 MG capsule Take 200 mg by mouth 3 (three) times daily as needed for cough. 01/09/24  Yes [provider]  Fluticasone-Umeclidin-Vilant (TRELEGY ELLIPTA) 200-62.5-25 MCG/ACT AEPB Inhale 2 puffs into the lungs daily.   Yes [provider]  gabapentin  (NEURONTIN ) 300 MG capsule Take 1 capsule (300 mg total) by mouth 2 (two) times daily. 01/03/24  Yes Wynetta Heckle, MD  insulin  glargine (LANTUS ) 100 UNIT/ML injection Inject 0.1 mLs (10 Units total) into the skin at bedtime. Patient taking differently: Inject 20 Units into the skin at bedtime. 12/11/23  Yes Johnson, Clanford L, MD  losartan  (COZAAR ) 50 MG tablet Take 50 mg by mouth daily. 03/02/22  Yes [provider]  metoprolol  tartrate (LOPRESSOR ) 50 MG tablet Take 25 mg by mouth 2 (two) times daily.    Yes [provider]  pantoprazole  (PROTONIX ) 40 MG tablet Take 1 tablet (40 mg total) by mouth 2 (two) times daily. 01/03/24  Yes Wynetta Heckle, MD  vitamin B-12 (CYANOCOBALAMIN ) 1000 MCG tablet Take 1,000 mcg by mouth in the morning.   Yes [provider]  aspirin  EC 81 MG tablet Take 1 tablet (81 mg total) by mouth daily with breakfast. 05/23/19   Emokpae, Courage, MD  clopidogrel  (PLAVIX ) 75 MG tablet Take 75 mg by mouth in the morning.    [provider]    Physical Exam:   Vitals:   01/13/24 1600 01/13/24 1630 01/13/24 1700 01/13/24 1730  BP: (!) 151/63 (!) 154/65 (!) 165/60 (!) 158/60  Pulse: 89 90 91 84  Resp: 18 (!) 23 (!) 21 17  Temp:    98.4 F (36.9 C)  TempSrc:    Oral  SpO2:  94% (!) 89% 92% 92%  Weight:  Height:         Physical Exam: Blood pressure (!) 158/60, pulse 84, temperature 98.4 F (36.9 C), temperature source Oral, resp. rate 17, height 5\' 7"  (1.702 m), weight 68 kg, SpO2 92%. Gen: No acute distress. Head: Normocephalic, atraumatic. Eyes: Pupils equal, round and reactive to light. Extraocular movements intact.  Sclerae nonicteric. No lid lag. Mouth: Oropharynx reveals moist mucous membranes. Dentition is fair. Neck: Supple, no thyromegaly, no lymphadenopathy, no jugular venous distention. Chest: Lungs are congested with scattered rhonchi.  Cough productive of thick clear mucus. CV: Heart sounds are regular with an S1, S2. No murmurs, rubs, clicks, or gallops.  Abdomen: Soft, nontender, nondistended with normal active bowel sounds. No hepatosplenomegaly or palpable masses. Extremities: Extremities are without clubbing, or cyanosis. No edema. Pedal pulses 2+.  Skin: Warm and dry. No rashes, lesions or wounds.  Scattered ecchymosis to upper extremities. Neuro: Alert and oriented times 3; grossly nonfocal.  Psych: Insight is good and judgment is appropriate. Mood and affect normal.   Data Review:    Labs: Basic Metabolic Panel: Recent Labs  Lab 01/13/24 1551  NA 131*  K 4.2  CL 99  CO2 22  GLUCOSE 105*  BUN 32*  CREATININE 2.37*  CALCIUM  8.2*   Liver Function Tests: Recent Labs  Lab 01/13/24 1551  AST 12*  ALT 13  ALKPHOS 81  BILITOT 0.8  PROT 5.9*  ALBUMIN  2.8*   CBC: Recent Labs  Lab 01/13/24 1551  WBC 34.4*  NEUTROABS 31.3*  HGB 8.2*  HCT 24.5*  MCV 82.2  PLT 356    Radiographic Studies: DG Chest Port 1 View Result Date: 01/13/2024 CLINICAL DATA:  Fever.  Decreased lung sounds. EXAM: PORTABLE CHEST 1 VIEW COMPARISON:  01/01/2024. FINDINGS: Stable cardiomediastinal silhouette. Prior median sternotomy and CABG. Increased patchy opacities at the left mid to upper lung zone. Similar streaky opacities in the right  upper lung and slightly increased streaky right basilar opacities. No large pleural effusion. No pneumothorax. Known bilateral rib fractures, better evaluated on the prior CT. IMPRESSION: Increased patchy opacities at the left mid to upper lung zone. Similar streaky opacities in the right upper lung and slightly increased streaky right basilar opacities. These findings could reflect multifocal pneumonia, aspiration, or edema. Electronically Signed   By: Mannie Seek M.D.   On: 01/13/2024 16:24    EKG: Independently reviewed.  Sinus tachycardia.  Interventricular conduction delay.   Assessment/Plan:   Principal Problem:   Multifocal pneumonia concerning for aspiration pneumonia Continue meropenem given recent hospitalizations.  Will discontinue vancomycin  since his nasal swab was negative for MRSA.  Will need a speech therapy/swallowing evaluation to rule out aspiration though with his lytic bone lesions, he may have some degree of immunosuppression that is contributing to his overall presentation.  He is not septic.  Would discontinue the Trelegy for now.  Continue albuterol  as needed.  The diagnosis of COPD has not been made with PFTs and he quit smoking over 30 years prior.  Add Mucinex /dextromethorphan  for cough control.  Active Problems:   PVD (peripheral vascular disease) (HCC) Aspirin  and Plavix  on hold pending bone marrow biopsy scheduled for 01/15/2024.    Essential hypertension Continue Norvasc , losartan  and metoprolol  as tolerated.    Neuropathy, peripheral Continue Neurontin .    HLD (hyperlipidemia) Does not appear to be on active therapy.  Given history of stroke on imaging, would recommend initiation of statin therapy to a goal LDL of less than 70.    Uncontrolled type  2 diabetes mellitus with hyperglycemia, with long-term current use of insulin  (HCC) Hold Lantus  while NPO.  SSI, insulin  sensitive scale given low GFR every 4 hours.  His last hemoglobin A1c was 7.6%.     Coronary artery disease involving coronary bypass graft of native heart without angina pectoris Aspirin  and Plavix  remain on hold.  He is not on a statin.    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    Chronic kidney disease (CKD), stage IV (severe) (HCC) Preserve kidney function and monitor creatinine daily.  HIV screening The patient falls between the ages of 13-64 and should be screened for HIV, therefore HIV testing ordered.  Body mass index is 23.49 kg/m.  Other information:   DVT prophylaxis: Lovenox  ordered. Code Status: Full code. Family Communication: Wife and daughter at the bedside. Disposition Plan: Home in several days. Consults called: None but would recommend involving the patient's oncologist in the morning. Admission status: Inpatient.  The medical decision making on this patient was of high complexity and the patient is at high risk for clinical deterioration, therefore this is a level 3 visit.   Craige Dixon Layanna Charo [facility] Pager [phone]   How to contact the [facility] Attending or Consulting provider 7A - 7P or covering provider during after hours 7P -7A, for this patient?   Check the care team in [facility] and look for a) attending/consulting [facility] provider listed and b) the [facility] team listed Log into [email or web address] and use [place]'s universal password to access. If you do not have the password, please contact the hospital operator. Locate the [facility] provider you are looking for under [facility] and page to a number that you can be directly reached. If you still have difficulty reaching the provider, please page the [facility] (Director on Call) for the Hospitalists listed on amion for assistance.  01/13/2024, 6:12 PM

## 2024-01-13 NOTE — Sepsis Progress Note (Addendum)
 Sepsis protocol is being followed by [facility]. Rocephin  given by EMS prior to sepsis protocol.

## 2024-01-13 NOTE — ED Provider Notes (Signed)
 [place] EMERGENCY DEPARTMENT AT [facility] Provider Note   CSN: [id number] Arrival date & time: 01/13/24  1440     History  No chief complaint on file.   [name] is a 78 y.o. male.  Patient is a 78 year old male who presents to the emergency department with a chief complaint of generalized malaise and fatigue, fever, frequent urination, cough and congestion.  He has recently been admitted secondary to multifocal pneumonia.  He was given 500 cc of LR by EMS, ibuprofen , Rocephin .  Patient notes that he has had no nausea, vomiting, diarrhea.  He denies any abdominal pain or discomfort at this point.  He has had no abnormal headaches, pain to neck or back or abnormal rashes.  He denies any chest pain.  He does admit to some intermittent shortness of breath.        Home Medications Prior to Admission medications   Medication Sig Start Date End Date Taking? Authorizing Provider  albuterol  (VENTOLIN  HFA) 108 (90 Base) MCG/ACT inhaler Inhale 2 puffs into the lungs every 6 (six) hours as needed for wheezing or shortness of breath. 01/03/24   Wynetta Heckle, MD  amLODipine  (NORVASC ) 10 MG tablet Take 10 mg by mouth in the morning.    [provider]  aspirin  EC 81 MG tablet Take 1 tablet (81 mg total) by mouth daily with breakfast. 05/23/19   Emokpae, Courage, MD  atorvastatin  (LIPITOR ) 40 MG tablet Take 1 tablet (40 mg total) by mouth at bedtime. 12/11/23   Johnson, Clanford L, MD  baclofen (LIORESAL) 10 MG tablet Take 10 mg by mouth at bedtime. 12/05/23   [provider]  benzonatate (TESSALON) 200 MG capsule Take 200 mg by mouth 3 (three) times daily as needed. 01/09/24   [provider]  cilostazol  (PLETAL ) 100 MG tablet Take 100 mg by mouth 2 (two) times daily.    [provider]  clopidogrel  (PLAVIX ) 75 MG tablet Take 75 mg by mouth in the morning.    [provider]  empagliflozin  (JARDIANCE ) 25 MG TABS tablet 12.5 mg daily as  directed 12/11/23   Rayfield Cairo, MD  gabapentin  (NEURONTIN ) 300 MG capsule Take 1 capsule (300 mg total) by mouth 2 (two) times daily. 01/03/24   Wynetta Heckle, MD  insulin  glargine (LANTUS ) 100 UNIT/ML injection Inject 0.1 mLs (10 Units total) into the skin at bedtime. Patient taking differently: Inject 20 Units into the skin at bedtime. 12/11/23   Johnson, Clanford L, MD  losartan  (COZAAR ) 50 MG tablet Take 50 mg by mouth daily. 03/02/22   [provider]  metoprolol  tartrate (LOPRESSOR ) 50 MG tablet Take 25 mg by mouth 2 (two) times daily.     [provider]  pantoprazole  (PROTONIX ) 40 MG tablet Take 1 tablet (40 mg total) by mouth 2 (two) times daily. 01/03/24   Wynetta Heckle, MD  traMADol  (ULTRAM ) 50 MG tablet Take 0.5 tablets (25 mg total) by mouth every 12 (twelve) hours as needed for severe pain (pain score 7-10). 12/11/23 12/10/24  Rayfield Cairo, MD  vitamin B-12 (CYANOCOBALAMIN ) 1000 MCG tablet Take 1,000 mcg by mouth in the morning.    [provider]      Allergies    Acarbose    Review of Systems   Review of Systems  Constitutional:  Positive for fatigue and fever.  Respiratory:  Positive for cough.   Genitourinary:  Positive for frequency.  All other systems reviewed and  are negative.   Physical Exam Updated Vital Signs BP (!) 140/58   Pulse 93   Temp 99 F (37.2 C) (Oral)   Resp 16   Ht 5\' 7"  (1.702 m)   Wt 68 kg   SpO2 92%   BMI 23.49 kg/m  Physical Exam Vitals and nursing note reviewed.  Constitutional:      Appearance: Normal appearance.  HENT:     Head: Normocephalic and atraumatic.     Nose: Nose normal.     Mouth/Throat:     Mouth: Mucous membranes are moist.  Eyes:     Extraocular Movements: Extraocular movements intact.     Conjunctiva/sclera: Conjunctivae normal.     Pupils: Pupils are equal, round, and reactive to light.  Cardiovascular:     Rate and Rhythm: Normal rate and regular rhythm.     Pulses: Normal  pulses.     Heart sounds: Normal heart sounds. No murmur heard.    No gallop.  Pulmonary:     Effort: Pulmonary effort is normal. No respiratory distress.     Breath sounds: Normal breath sounds. No stridor. No wheezing.     Comments: Rales noted throughout the left lung Abdominal:     General: Abdomen is flat. Bowel sounds are normal. There is no distension.     Palpations: Abdomen is soft.     Tenderness: There is no abdominal tenderness. There is no guarding.  Musculoskeletal:        General: Normal range of motion.     Cervical back: Normal range of motion and neck supple. No rigidity or tenderness.  Skin:    General: Skin is warm and dry.     Findings: No bruising or rash.  Neurological:     General: No focal deficit present.     Mental Status: He is alert and oriented to person, place, and time. Mental status is at baseline.  Psychiatric:        Mood and Affect: Mood normal.        Behavior: Behavior normal.        Thought Content: Thought content normal.        Judgment: Judgment normal.     ED Results / Procedures / Treatments   Labs (all labs ordered are listed, but only abnormal results are displayed) Labs Reviewed  CBC WITH DIFFERENTIAL/PLATELET - Abnormal; Notable for the following components:      Result Value   WBC 34.4 (*)    RBC 2.98 (*)    Hemoglobin 8.2 (*)    HCT 24.5 (*)    RDW 17.9 (*)    All other components within normal limits  RESP PANEL BY RT-PCR (RSV, FLU A&B, COVID)  RVPGX2  CULTURE, BLOOD (ROUTINE X 2)  CULTURE, BLOOD (ROUTINE X 2)  MRSA NEXT GEN BY PCR, NASAL  LACTIC ACID, PLASMA  LACTIC ACID, PLASMA  COMPREHENSIVE METABOLIC PANEL WITH GFR  PROTIME-INR  URINALYSIS, W/ REFLEX TO CULTURE (INFECTION SUSPECTED)    EKG None  Radiology No results found.  Procedures Procedures    Medications Ordered in ED Medications  lactated ringers  infusion (has no administration in time range)  vancomycin  (VANCOREADY) IVPB 1500 mg/300 mL  (1,500 mg Intravenous New Bag/Given 01/13/24 1556)  meropenem (MERREM) 1 g in sodium chloride  0.9 % 100 mL IVPB (1 g Intravenous New Bag/Given 01/13/24 1541)  lactated ringers  bolus 1,000 mL (1,000 mLs Intravenous New Bag/Given 01/13/24 1530)  lactated ringers  bolus 500 mL (500 mLs Intravenous New Bag/Given  01/13/24 1545)    ED Course/ Medical Decision Making/ A&P                                 Medical Decision Making Amount and/or Complexity of Data Reviewed Labs: ordered. Radiology: ordered.  Risk Prescription drug management. Decision regarding hospitalization.   This patient presents to the ED for concern of fever, chills, cough, this involves an extensive number of treatment options, and is a complaint that carries with it a high risk of complications and morbidity.  The differential diagnosis includes sepsis, pneumonia, urinary tract infection, acute kidney injury, acute viral syndrome, electrolyte derangement   Co morbidities that complicate the patient evaluation  History of pneumonia   Additional history obtained:  Additional history obtained from medical records External records from outside source obtained and reviewed including medical records   Lab Tests:  I Ordered, and personally interpreted labs.  The pertinent results include: Associated leukocytosis, anemia, mild hyponatremia, elevated creatinine but at baseline, normal liver function, normal lactic acid, negative viral swab   Imaging Studies ordered:  I ordered imaging studies including chest x-ray I independently visualized and interpreted imaging which showed bilateral infiltrates I agree with the radiologist interpretation   Cardiac Monitoring: / EKG:  The patient was maintained on a cardiac monitor.  I personally viewed and interpreted the cardiac monitored which showed an underlying rhythm of: Sinus tachycardia, no ST/T wave changes, no ischemic changes, no STEMI   Consultations Obtained:  I  requested consultation with the hospitalist,  and discussed lab and imaging findings as well as pertinent plan - they recommend: Admission   Problem List / ED Course / Critical interventions / Medication management  Patient does remain stable at this time.  Discussed with patient we will plan for admission to the hospital service given his sepsis and pneumonia.  He has been covered for hospital-acquired pneumonia at this point as well as given IV fluids.  He did receive 500 cc of LR and route.  Kidney function is stable at this point as well as his anemia.  Of note patient is also scheduled for bone biopsy on Tuesday this week at [place].  Patient has no tenderness to palpation throughout his abdomen at this point.  Urinalysis is still pending.  Did discuss patient case with Dr. Odella Bending with the hospitalist service who has excepted at this time. I ordered medication including vancomycin , meropenem, IV fluids for sepsis Reevaluation of the patient after these medicines showed that the patient improved I have reviewed the patients home medicines and have made adjustments as needed   Social Determinants of Health:  None   Test / Admission - Considered:  Admission        Final Clinical Impression(s) / ED Diagnoses Final diagnoses:  None    Rx / DC Orders ED Discharge Orders     None         Roselynn Connors, PA-C 01/13/24 1716    Deatra Face, MD 01/15/24 [id number]

## 2024-01-13 NOTE — ED Triage Notes (Signed)
 Pt BIB [facility] coming from home. Pt presents with generalized weakness, fever, and frequent urination. He was recently discharged from the hospital for pneumonia. EMS report decreased lung sounds on the L and rhonchi on the R. Pt is scheduled for an upcoming bone biopsy due to suspected CA diagnosis. EMS admin 500 mL of NaCl, 400 mg of ibuprofen , and 1 g of Rocephin  IV.   EMS Vitals   124/62 T 101.3 HR 107 RR 14 SpO2 96% on RA

## 2024-01-14 ENCOUNTER — Inpatient Hospital Stay: Admitting: Dietician

## 2024-01-14 DIAGNOSIS — I739 Peripheral vascular disease, unspecified: Secondary | ICD-10-CM

## 2024-01-14 DIAGNOSIS — I10 Essential (primary) hypertension: Secondary | ICD-10-CM

## 2024-01-14 DIAGNOSIS — J189 Pneumonia, unspecified organism: Secondary | ICD-10-CM | POA: Diagnosis not present

## 2024-01-14 DIAGNOSIS — E1165 Type 2 diabetes mellitus with hyperglycemia: Secondary | ICD-10-CM

## 2024-01-14 DIAGNOSIS — I25810 Atherosclerosis of coronary artery bypass graft(s) without angina pectoris: Secondary | ICD-10-CM

## 2024-01-14 DIAGNOSIS — G603 Idiopathic progressive neuropathy: Secondary | ICD-10-CM

## 2024-01-14 DIAGNOSIS — E782 Mixed hyperlipidemia: Secondary | ICD-10-CM

## 2024-01-14 DIAGNOSIS — N184 Chronic kidney disease, stage 4 (severe): Secondary | ICD-10-CM | POA: Diagnosis not present

## 2024-01-14 DIAGNOSIS — Z794 Long term (current) use of insulin: Secondary | ICD-10-CM

## 2024-01-14 LAB — BASIC METABOLIC PANEL WITH GFR
Anion gap: 9 (ref 5–15)
BUN: 30 mg/dL — ABNORMAL HIGH (ref 8–23)
CO2: 23 mmol/L (ref 22–32)
Calcium: 8.2 mg/dL — ABNORMAL LOW (ref 8.9–10.3)
Chloride: 102 mmol/L (ref 98–111)
Creatinine, Ser: 2.22 mg/dL — ABNORMAL HIGH (ref 0.61–1.24)
GFR, Estimated: 30 mL/min — ABNORMAL LOW (ref >60)
Glucose, Bld: 156 mg/dL — ABNORMAL HIGH (ref 70–99)
Potassium: 4.1 mmol/L (ref 3.5–5.1)
Sodium: 134 mmol/L — ABNORMAL LOW (ref 135–145)

## 2024-01-14 LAB — GLUCOSE, CAPILLARY
Glucose-Capillary: 112 mg/dL — ABNORMAL HIGH (ref 70–99)
Glucose-Capillary: 154 mg/dL — ABNORMAL HIGH (ref 70–99)
Glucose-Capillary: 174 mg/dL — ABNORMAL HIGH (ref 70–99)
Glucose-Capillary: 217 mg/dL — ABNORMAL HIGH (ref 70–99)
Glucose-Capillary: 290 mg/dL — ABNORMAL HIGH (ref 70–99)
Glucose-Capillary: 327 mg/dL — ABNORMAL HIGH (ref 70–99)
Glucose-Capillary: 98 mg/dL (ref 70–99)

## 2024-01-14 LAB — CBC
HCT: 25.4 % — ABNORMAL LOW (ref 39.0–52.0)
Hemoglobin: 7.8 g/dL — ABNORMAL LOW (ref 13.0–17.0)
MCH: 25.9 pg — ABNORMAL LOW (ref 26.0–34.0)
MCHC: 30.7 g/dL (ref 30.0–36.0)
MCV: 84.4 fL (ref 80.0–100.0)
Platelets: 321 10*3/uL (ref 150–400)
RBC: 3.01 MIL/uL — ABNORMAL LOW (ref 4.22–5.81)
RDW: 18 % — ABNORMAL HIGH (ref 11.5–15.5)
WBC: 27.1 10*3/uL — ABNORMAL HIGH (ref 4.0–10.5)
nRBC: 0 % (ref 0.0–0.2)

## 2024-01-14 LAB — HIV ANTIBODY (ROUTINE TESTING W REFLEX): HIV Screen 4th Generation wRfx: NONREACTIVE

## 2024-01-14 MED ORDER — SODIUM CHLORIDE 0.9 % IV SOLN
2.0000 g | INTRAVENOUS | Status: DC
  Administered 2024-01-14 – 2024-01-16 (×3): 2 g via INTRAVENOUS
  Filled 2024-01-14 (×3): qty 12.5

## 2024-01-14 MED ORDER — OXYCODONE HCL 5 MG PO TABS
5.0000 mg | ORAL_TABLET | Freq: Four times a day (QID) | ORAL | Status: DC | PRN
  Administered 2024-01-14: 5 mg via ORAL
  Filled 2024-01-14: qty 1

## 2024-01-14 MED ORDER — METRONIDAZOLE 500 MG/100ML IV SOLN
500.0000 mg | Freq: Two times a day (BID) | INTRAVENOUS | Status: DC
  Administered 2024-01-14 – 2024-01-17 (×6): 500 mg via INTRAVENOUS
  Filled 2024-01-14 (×6): qty 100

## 2024-01-14 MED ORDER — ACETAMINOPHEN 325 MG PO TABS: 650.0000 mg | ORAL_TABLET | Freq: Four times a day (QID) | ORAL | Status: DC | PRN

## 2024-01-14 NOTE — TOC Initial Note (Signed)
 Transition of Care [facility]) - Initial/Assessment Note    Patient Details  Name: [name] MRN: [id number] Date of Birth: 1945-09-10  Transition of Care [facility]) CM/SW Contact:    Geraldina Klinefelter, RN Phone Number: 01/14/2024, 4:31 PM  Clinical Narrative:                 Pt from home c/wife. Pt has had several adms for pneumonia states he has been compliant c/follow-ups and prescribed medications. Repeat bone biopsy c/[facility] scheduled for tmrw. Pt has cane, walker, wc, shower seat. Plans to dc home, wife in agreement.      Patient Goals and CMS Choice            Expected Discharge Plan and Services                                              Prior Living Arrangements/Services                       Activities of Daily Living   ADL Screening (condition at time of admission) Independently performs ADLs?: Yes (appropriate for developmental age) Is the patient deaf or have difficulty hearing?: No Does the patient have difficulty seeing, even when wearing glasses/contacts?: No Does the patient have difficulty concentrating, remembering, or making decisions?: No  Permission Sought/Granted                  Emotional Assessment              Admission diagnosis:  Pneumonia of left upper lobe due to infectious organism [J18.9] Multifocal pneumonia [J18.9] Sepsis, due to unspecified organism, unspecified whether acute organ dysfunction present [facility]) [A41.9] Patient Active Problem List   Diagnosis Date Noted   Chronic kidney disease (CKD), stage IV (severe) (HCC) 01/13/2024   Coffee ground emesis 01/01/2024   Acute esophagitis 01/01/2024   Acute respiratory failure with hypoxia (HCC) 01/01/2024   Acute renal failure superimposed on stage 4 chronic kidney disease (HCC) 12/29/2023   Metastatic cancer to bone (HCC) 12/18/2023   Syncope and collapse 12/10/2023   Hypocalcemia 12/10/2023   Anemia 12/10/2023   Lytic bone lesions on xray 12/08/2023    Multifocal pneumonia 12/07/2023   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   H/O adenomatous polyp of colon 11/22/2022   Aortic stenosis 05/30/2022   Coronary artery disease involving coronary bypass graft of native heart without angina pectoris 11/15/2021   Pain and swelling of right elbow 04/23/2021   Fall    Closed nondisplaced intertrochanteric fracture of left femur (HCC)    Closed left hip fracture, initial encounter (HCC) 04/01/2021   CAP (community acquired pneumonia) 05/21/2019   Diarrhea 03/18/2019   Loss of weight 03/18/2019   Klebsiella sepsis (HCC) 09/15/2018   Urinary tract infection without hematuria    Acute lower UTI 09/13/2018   ABLA (acute blood loss anemia) 03/27/2018   S/P colostomy takedown 03/26/2018   Hypertension 12/12/2017   Type 2 diabetes mellitus with diabetic neuropathy (HCC) 12/12/2017   CKD (chronic kidney disease), stage III (HCC) 12/12/2017   Hypercholesteremia 12/12/2017   Hypokalemia 12/12/2017   GI bleed 12/06/2017   Rectal bleed 12/06/2017   Uncontrolled type 2 diabetes mellitus with hyperglycemia, with long-term current use of insulin  (HCC) 12/06/2017   Acute renal failure superimposed on stage 3 chronic kidney disease (HCC) 12/06/2017  Malnutrition of moderate degree 12/06/2017   Abnormal CT scan, colon 11/23/2017   Constipation 11/23/2017   Pneumonia of left upper lobe due to infectious organism 10/02/2017   Diverticulitis large intestine 08/19/2017   Diverticulitis of large intestine with perforation and abscess without bleeding    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 08/16/2017   CKD (chronic kidney disease) stage 3, GFR 30-59 ml/min (HCC) 08/02/2017   Sepsis due to undetermined organism (HCC) 08/01/2017   GERD (gastroesophageal reflux disease) 08/01/2017   PAD (peripheral artery disease) (HCC) 08/09/2016   Claudication (HCC) 05/08/2016   HCAP (healthcare-associated pneumonia) 01/12/2016   Hyponatremia 01/12/2016    Abnormal chest CT 10/13/2015   Hx of CABG 10/13/2015   Type 2 diabetes, HbA1c goal < 7% (HCC)    Unstable angina (HCC)    Hypomagnesemia    Type 2 diabetes mellitus (HCC) 10/09/2015   ASCVD (arteriosclerotic cardiovascular disease) 10/09/2015   CKD (chronic kidney disease) stage 2, GFR 60-89 ml/min 10/09/2015   Sepsis (HCC) 07/09/2015   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 [place] [address] Phone: [phone] Fax: [phone]  [facility] - [place], [place] - [address] [place] [address] Phone: [phone] Fax: [phone]  [facility] [address] [place] [address] Phone: [phone] Fax: [phone]     Social Drivers of Health (SDOH) Social History: SDOH Screenings   Food Insecurity: No Food Insecurity (01/13/2024)  Recent Concern: Food Insecurity - Food Insecurity Present (12/18/2023)  Housing: Low Risk  (01/13/2024)  Transportation Needs: No Transportation Needs (01/13/2024)  Utilities: Not At Risk (01/13/2024)  Depression (PHQ2-9): Low Risk  (12/18/2023)  Financial Resource Strain: Low Risk  (10/08/2023)   Received from [facility]  Social Connections: Socially Integrated (01/13/2024)  Tobacco Use: Medium Risk (01/13/2024)  Health Literacy: High Risk (10/08/2023)   Received from [facility]   SDOH Interventions:     Readmission Risk Interventions    01/14/2024    4:29 PM  Readmission Risk Prevention Plan  Transportation Screening Complete  PCP or Specialist Appt within 3-5 Days Complete   HRI or Home Care Consult Complete  Social Work Consult for Recovery Care Planning/Counseling Complete  Palliative Care Screening Not Applicable  Medication Review Oceanographer) Complete

## 2024-01-14 NOTE — Plan of Care (Signed)
   Problem: Health Behavior/Discharge Planning: Goal: Ability to manage health-related needs will improve Outcome: Progressing   Problem: Clinical Measurements: Goal: Will remain free from infection Outcome: Progressing   Problem: Activity: Goal: Risk for activity intolerance will decrease Outcome: Progressing

## 2024-01-14 NOTE — Progress Notes (Signed)
 Mobility Specialist Progress Note:    01/14/24 1050  Mobility  Activity Ambulated with assistance in hallway  Level of Assistance Standby assist, set-up cues, supervision of patient - no hands on  Assistive Device Front wheel walker  Distance Ambulated (ft) 60 ft  Range of Motion/Exercises Active;All extremities  Activity Response Tolerated well  Mobility Referral Yes  Mobility visit 1 Mobility  Mobility Specialist Start Time (ACUTE ONLY) 1030  Mobility Specialist Stop Time (ACUTE ONLY) 1047  Mobility Specialist Time Calculation (min) (ACUTE ONLY) 17 min   Pt received in bed, daughter in room. Agreeable to mobility, required SBA to stand and ambulate with RW. Tolerated well, asx throughout. Returned to room, left pt sitting EOB. All needs met.   [name] Mobility Specialist Please contact via Special educational needs teacher or  Rehab office at [phone]

## 2024-01-14 NOTE — H&P (Signed)
 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   Referring Provider(s): Dr. Paulett Boros, MD   Supervising Physician: Luellen Sages  Patient Status: [facility] - In-pt  Patient is Full Code  History of Present Illness: [name] is a 78 y.o. male  with PMHx notable for HTN, HLD, CAD s/p CABG x5, PVD, DMT2, CKD, GERD, anemia, and diverticulitis.  Patient is know to IR service, having most recently undergone T9 lesion biopsy on 5/14 by Dr. Alvira Josephs. Unfortunately, no no tissue diagnosis could be made from that biopsy.  Patient was recently admitted for pneumonia, with incidental finding on CT concerning for T9 pathologic fracture. Subsequent thoracic and lumbar spine MRI was notable for lytic bone lesions involving the thoracic, lumbar, and sacral bones. PET scan on 5/15 was notable for "multifocal mild areas of abnormal uptake along the skeleton including several ribs, vertebral bodies and scattered along the pelvis. Some of these lesions are associated with areas of bony sclerosis." Dr. Cheree Cords is requesting repeat bone biopsy.  Interventional Radiology was requested for L3 lesion biopsy. Request was reviewed and approved by Dr. Alvira Josephs. Patient is scheduled for same in IR today.   Patient is alert and laying in bed, calm.  Patient is currently without any significant complaints. He does have an intermittent mild cough.  Patient denies any fevers, headache, chest pain, SOB, abdominal pain, nausea, vomiting or bleeding.     Past Medical History:  Diagnosis Date   Anemia    CAD S/P percutaneous coronary angioplasty 1998   PCI TO CX   CKD (chronic kidney disease) stage 3, GFR 30-59 ml/min (HCC)    Diabetes mellitus without complication (HCC)    Diverticulitis 08/16/2017   hospitalized with diverticulitis/sepsis   GERD (gastroesophageal reflux  disease)    occassionally   Hx of CABG 10/2015   x 5   Hypercholesteremia    Hypertension    Neuropathy    Peripheral vascular disease (HCC)    s/p R-L FEM-FEM BYPASS   Pneumonia    Tachycardia    after CABG, pt on medicine for this    Past Surgical History:  Procedure Laterality Date   ABDOMINAL AORTOGRAM Bilateral 09/19/2016   Procedure: iliac;  Surgeon: Margherita Shell, MD;  Location: [facility];  Service: Cardiovascular;  Laterality: Bilateral;   ABDOMINAL AORTOGRAM W/LOWER EXTREMITY N/A 09/22/2021   Procedure: ABDOMINAL AORTOGRAM W/LOWER EXTREMITY;  Surgeon: Young Hensen, MD;  Location: [facility];  Service: Cardiovascular;  Laterality: N/A;   ABDOMINAL AORTOGRAM W/LOWER EXTREMITY N/A 10/05/2022   Procedure: ABDOMINAL AORTOGRAM W/LOWER EXTREMITY;  Surgeon: Young Hensen, MD;  Location: [facility];  Service: Cardiovascular;  Laterality: N/A;   BIOPSY  05/16/2023   Procedure: BIOPSY;  Surgeon: Suzette Espy, MD;  [facility]: [facility];  Service: Endoscopy;;   CARDIAC CATHETERIZATION  2003   with stent   CARDIAC CATHETERIZATION N/A 10/11/2015   Procedure: Left Heart Cath and Coronary Angiography;  Surgeon: Peter M Swaziland, MD;  Location: [facility] INVASIVE CV LAB;  Service: Cardiovascular;  Laterality: N/A;   COLON RESECTION N/A 12/13/2017   Procedure: EXPLORATORY LAPAROTOMY, SIGMOID COLECTOMY WITH COLOSTOMY;  Surgeon: Dareen Ebbing, MD;  Location: [facility] OR;  Service: General;  Laterality: N/A;   COLONOSCOPY N/A 09/22/2013   Procedure: COLONOSCOPY;  Surgeon: Suzette Espy, MD;  [facility]: [facility];  Service: Endoscopy;  Laterality: N/A;  9:30 AM   COLONOSCOPY N/A 02/19/2018   Procedure: COLONOSCOPY;  Surgeon: Suzette Espy, MD;  [facility];  Service: Endoscopy;  Laterality: N/A;  2:00pm   COLONOSCOPY WITH PROPOFOL  N/A 05/16/2023   Procedure: COLONOSCOPY WITH PROPOFOL ;  Surgeon: Suzette Espy, MD;  [facility];   Service: Endoscopy;  Laterality: N/A;  730am, asa 3   COLOSTOMY N/A 12/13/2017   Procedure: COLOSTOMY;  Surgeon: Dareen Ebbing, MD;  Location: [facility] OR;  Service: General;  Laterality: N/A;   COLOSTOMY REVERSAL N/A 03/26/2018   Procedure: COLOSTOMY REVERSAL;  Surgeon: Dareen Ebbing, MD;  Location: [facility];  Service: General;  Laterality: N/A;   CORONARY ARTERY BYPASS GRAFT N/A 10/18/2015   Procedure: CORONARY ARTERY BYPASS GRAFTING (CABG) x  five, using left internal mammary artery and right leg greater saphenous vein harvested endoscopically;  Surgeon: Zelphia Higashi, MD;  Location: [facility] OR;  Service: Open Heart Surgery;  Laterality: N/A;   ENDARTERECTOMY FEMORAL Right 08/09/2016   Procedure: ENDARTERECTOMY FEMORAL WITH VEIN PATCH ANGIOPLASTY;  Surgeon: Margherita Shell, MD;  Location: [facility] OR;  Service: Vascular;  Laterality: Right;   ESOPHAGOGASTRODUODENOSCOPY N/A 10/24/2017   Dr. Riley Cheadle: hiatal hernia   ESOPHAGOGASTRODUODENOSCOPY N/A 01/01/2024   Procedure: EGD (ESOPHAGOGASTRODUODENOSCOPY);  Surgeon: Hargis Lias, MD;  [facility];  Service: Endoscopy;  Laterality: N/A;   ESOPHAGOGASTRODUODENOSCOPY (EGD) WITH PROPOFOL  N/A 05/16/2023   Procedure: ESOPHAGOGASTRODUODENOSCOPY (EGD) WITH PROPOFOL ;  Surgeon: Suzette Espy, MD;  [facility];  Service: Endoscopy;  Laterality: N/A;   FEMORAL-FEMORAL BYPASS GRAFT Bilateral 08/09/2016   Procedure: REVISION BYPASS GRAFT RIGHT FEMORAL-LEFT FEMORAL ARTERY;  Surgeon: Margherita Shell, MD;  Location: [facility] OR;  Service: Vascular;  Laterality: Bilateral;   FEMORAL-POPLITEAL BYPASS GRAFT     INTRAMEDULLARY (IM) NAIL INTERTROCHANTERIC Left 04/03/2021   Procedure: LEFT  HIP INTRAMEDULLARY (IM) NAIL INTERTROCHANTRIC;  Surgeon: Arnie Lao, MD;  Location: [facility];  Service: Orthopedics;  Laterality: Left;   IR FLUORO GUIDED NEEDLE PLC ASPIRATION/INJECTION LOC  12/26/2023   MALONEY DILATION N/A 05/16/2023   Procedure: MALONEY  DILATION;  Surgeon: Suzette Espy, MD;  [facility];  Service: Endoscopy;  Laterality: N/A;   PERIPHERAL VASCULAR BALLOON ANGIOPLASTY Right 09/22/2021   Procedure: PERIPHERAL VASCULAR BALLOON ANGIOPLASTY;  Surgeon: Young Hensen, MD;  [facility];  Service: Cardiovascular;  Laterality: Right;   PERIPHERAL VASCULAR CATHETERIZATION N/A 05/08/2016   Procedure: Lower Extremity Angiography;  Surgeon: Avanell Leigh, MD;  Location: [facility] INVASIVE CV LAB;  Service: Cardiovascular;  Laterality: N/A;   PERIPHERAL VASCULAR INTERVENTION Right 09/19/2016   Procedure: Peripheral Vascular Intervention;  Surgeon: Margherita Shell, MD;  [facility]: [facility];  Service: Cardiovascular;  Laterality: Right;  ext iliac stent   PERIPHERAL VASCULAR INTERVENTION Right 09/22/2021   Procedure: PERIPHERAL VASCULAR INTERVENTION;  Surgeon: Young Hensen, MD;  [facility]: [facility];  Service: Cardiovascular;  Laterality: Right;   POLYPECTOMY  02/19/2018   Procedure: POLYPECTOMY;  Surgeon: Suzette Espy, MD;  [facility];  Service: Endoscopy;;   PROCTOSCOPY  10/24/2017   Procedure: PROCTOSCOPY;  Surgeon: Suzette Espy, MD;  [facility];  Service: Endoscopy;;   TEE WITHOUT CARDIOVERSION N/A 10/18/2015   Procedure: TRANSESOPHAGEAL ECHOCARDIOGRAM (TEE);  Surgeon: Zelphia Higashi, MD;  Location: [facility] OR;  Service: Open Heart Surgery;  Laterality: N/A;    Allergies: Acarbose  Medications: Prior to Admission  medications   Medication Sig Start Date End Date Taking? Authorizing Provider  albuterol  (VENTOLIN  HFA) 108 (90 Base) MCG/ACT inhaler Inhale 2 puffs into the lungs every 6 (six) hours as needed for wheezing or shortness of breath. 01/03/24  Yes Wynetta Heckle, MD  amLODipine  (NORVASC ) 10 MG tablet Take 10 mg by mouth in the morning.   Yes [provider]  benzonatate (TESSALON) 200 MG capsule Take 200 mg by mouth 3 (three) times  daily as needed for cough. 01/09/24  Yes [provider]  Fluticasone-Umeclidin-Vilant (TRELEGY ELLIPTA) 200-62.5-25 MCG/ACT AEPB Inhale 2 puffs into the lungs daily.   Yes [provider]  gabapentin  (NEURONTIN ) 300 MG capsule Take 1 capsule (300 mg total) by mouth 2 (two) times daily. 01/03/24  Yes Wynetta Heckle, MD  insulin  glargine (LANTUS ) 100 UNIT/ML injection Inject 0.1 mLs (10 Units total) into the skin at bedtime. Patient taking differently: Inject 20 Units into the skin at bedtime. 12/11/23  Yes Johnson, Clanford L, MD  losartan  (COZAAR ) 50 MG tablet Take 50 mg by mouth daily. 03/02/22  Yes [provider]  metoprolol  tartrate (LOPRESSOR ) 50 MG tablet Take 25 mg by mouth 2 (two) times daily.    Yes [provider]  pantoprazole  (PROTONIX ) 40 MG tablet Take 1 tablet (40 mg total) by mouth 2 (two) times daily. 01/03/24  Yes Wynetta Heckle, MD  vitamin B-12 (CYANOCOBALAMIN ) 1000 MCG tablet Take 1,000 mcg by mouth in the morning.   Yes [provider]  aspirin  EC 81 MG tablet Take 1 tablet (81 mg total) by mouth daily with breakfast. 05/23/19   Emokpae, Courage, MD  clopidogrel  (PLAVIX ) 75 MG tablet Take 75 mg by mouth in the morning.    [provider]     Family History  Problem Relation Age of Onset   Heart attack Mother    Stroke Mother    Colon cancer Brother        late 42s    Social History   Socioeconomic History   Marital status: Married    Spouse name: Not on file   Number of children: Not on file   Years of education: Not on file   Highest education level: Not on file  Occupational History   Not on file  Tobacco Use   Smoking status: Former    Current packs/day: 0.00    Average packs/day: 2.0 packs/day for 30.0 years (60.0 ttl pk-yrs)    Types: Cigarettes    Start date: 08/14/1970    Quit date: 08/14/2000    Years since quitting: 23.4   Smokeless tobacco: Never  Vaping Use   Vaping status: Never Used  Substance and  Sexual Activity   Alcohol  use: No   Drug use: No   Sexual activity: Not Currently  Other Topics Concern   Not on file  Social History Narrative   Not on file   Social Drivers of Health   Financial Resource Strain: Low Risk  (10/08/2023)   Received from [facility]   Overall Financial Resource Strain (CARDIA)    Difficulty of Paying Living Expenses: Not very hard  Food Insecurity: No Food Insecurity (01/13/2024)   Hunger Vital Sign    Worried About Running Out of Food in the Last Year: Never true    Ran Out of Food in the Last Year: Never true  Recent Concern: Food Insecurity - Food Insecurity Present (12/18/2023)   Hunger Vital Sign    Worried About Running  Out of Food in the Last Year: Sometimes true    Ran Out of Food in the Last Year: Sometimes true  Transportation Needs: No Transportation Needs (01/13/2024)   PRAPARE - Administrator, Civil Service (Medical): No    Lack of Transportation (Non-Medical): No  Physical Activity: Not on file  Stress: Not on file  Social Connections: Socially Integrated (01/13/2024)   Social Connection and Isolation Panel [NHANES]    Frequency of Communication with Friends and Family: More than three times a week    Frequency of Social Gatherings with Friends and Family: Three times a week    Attends Religious Services: More than 4 times per year    Active Member of Clubs or Organizations: Yes    Attends Banker Meetings: 1 to 4 times per year    Marital Status: Married     Review of Systems: A 12 point ROS discussed and pertinent positives are indicated in the HPI above.  All other systems are negative.  Vital Signs: BP (!) 171/66   Pulse 85   Temp 98.6 F (37 C) (Oral)   Resp 20   Ht 5\' 7"  (1.702 m)   Wt 140 lb 10.5 oz (63.8 kg)   SpO2 96%   BMI 22.03 kg/m   Advance Care Plan: The advanced care place/surrogate decision maker was discussed at the time of visit and the patient did not wish to discuss or was not  able to name a surrogate decision maker or provide an advance care plan.  Physical Exam Vitals reviewed.  Constitutional:      General: He is not in acute distress.    Appearance: Normal appearance.  HENT:     Mouth/Throat:     Mouth: Mucous membranes are dry.  Cardiovascular:     Rate and Rhythm: Normal rate and regular rhythm.     Pulses: Normal pulses.     Heart sounds: No murmur heard. Pulmonary:     Effort: Pulmonary effort is normal.     Breath sounds: Rhonchi and rales present. No wheezing.     Comments: Cough Abdominal:     General: Abdomen is flat.     Tenderness: There is no abdominal tenderness.  Musculoskeletal:        General: Normal range of motion.     Cervical back: Normal range of motion.  Skin:    General: Skin is warm and dry.  Neurological:     Mental Status: He is alert and oriented to person, place, and time.  Psychiatric:        Mood and Affect: Mood normal.        Behavior: Behavior normal.        Thought Content: Thought content normal.        Judgment: Judgment normal.     Imaging: DG Chest Port 1 View Result Date: 01/13/2024 CLINICAL DATA:  Fever.  Decreased lung sounds. EXAM: PORTABLE CHEST 1 VIEW COMPARISON:  01/01/2024. FINDINGS: Stable cardiomediastinal silhouette. Prior median sternotomy and CABG. Increased patchy opacities at the left mid to upper lung zone. Similar streaky opacities in the right upper lung and slightly increased streaky right basilar opacities. No large pleural effusion. No pneumothorax. Known bilateral rib fractures, better evaluated on the prior CT. IMPRESSION: Increased patchy opacities at the left mid to upper lung zone. Similar streaky opacities in the right upper lung and slightly increased streaky right basilar opacities. These findings could reflect multifocal pneumonia, aspiration, or edema. Electronically Signed  By: Mannie Seek M.D.   On: 01/13/2024 16:24   CT HEAD WO CONTRAST ( ) Result Date:  01/03/2024 CLINICAL DATA:  Head trauma, minor (Age >= 65y).  Fall EXAM: CT HEAD WITHOUT CONTRAST TECHNIQUE: Contiguous axial images were obtained from the base of the skull through the vertex without intravenous contrast. RADIATION DOSE REDUCTION: This exam was performed according to the departmental dose-optimization program which includes automated exposure control, adjustment of the mA and/or kV according to patient size and/or use of iterative reconstruction technique. COMPARISON:  12/10/2023 FINDINGS: Brain: Cavum septum pellucidum. Moderate global parenchymal volume loss is stable since prior examination. Mild periventricular white matter changes are present likely reflecting the sequela of small vessel ischemia. 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 No abnormal intra or extra-axial mass lesion or fluid collection. No abnormal mass effect or midline shift. No evidence of acute intracranial hemorrhage or infarct. Ventricular size is normal. Cerebellum unremarkable. Vascular: No asymmetric hyperdense vasculature at the skull base. Skull: Intact Sinuses/Orbits: Paranasal sinuses are clear. Orbits are unremarkable. Other: Dense fluid opacification of the right mastoid air cells and middle ear cavity. No associated osseous erosion or coalescence. Left mastoid air cells are clear. Small fluid noted within the left middle ear cavity. IMPRESSION: 1. No acute intracranial abnormality. No calvarial fracture. 2. Stable moderate global parenchymal volume loss and remote right frontal periventricular white matter infarct. 3. Dense fluid opacification of the right mastoid air cells and middle ear cavity. No associated osseous erosion or coalescence. Small fluid noted within the left middle ear cavity. Electronically Signed   By: Worthy Heads M.D.   On: 01/03/2024 01:16   NM Pulmonary Perfusion Result Date: 01/02/2024 CLINICAL DATA:  Hypoxia, tachycardia, metastatic cancer EXAM: NUCLEAR  MEDICINE PERFUSION LUNG SCAN TECHNIQUE: Perfusion images were obtained in multiple projections after intravenous injection of radiopharmaceutical. Ventilation scans intentionally deferred if perfusion scan and chest x-ray adequate for interpretation during COVID 19 epidemic. RADIOPHARMACEUTICALS:  4.4 mCi Tc-23m MAA IV COMPARISON:  Chest radiograph 01/01/2024 FINDINGS: Perfusion imaging demonstrates no segmental or subsegmental perfusion abnormalities. IMPRESSION: No segmental or subsegmental perfusion abnormalities to suggest pulmonary embolism. Electronically Signed   By: Rozell Cornet M.D.   On: 01/02/2024 14:29   DG CHEST PORT 1 VIEW Result Date: 01/01/2024 CLINICAL DATA:  Hypoxia EXAM: PORTABLE CHEST 1 VIEW COMPARISON:  Chest radiograph dated 12/28/2023 FINDINGS: Normal lung volumes. Patchy right upper and left basilar opacities. No pleural effusion or pneumothorax. Similar mildly enlarged cardiomediastinal silhouette. Median sternotomy wires are nondisplaced. Bilateral rib fractures, better evaluated on prior CT. IMPRESSION: Patchy right upper and left basilar opacities, which may represent atelectasis, aspiration, or pneumonia. Electronically Signed   By: Limin  Xu M.D.   On: 01/01/2024 20:27   NM PET Image Initial (PI) Skull Base To Thigh Result Date: 01/01/2024 CLINICAL DATA:  Bone lesions. EXAM: NUCLEAR MEDICINE PET SKULL BASE TO THIGH TECHNIQUE: 7.08 mCi F-18 FDG was injected intravenously. Full-ring PET imaging was performed from the skull base to thigh after the radiotracer. CT data was obtained and used for attenuation correction and anatomic localization. Fasting blood glucose: 187 mg/dl COMPARISON:  Remote PET-CT scan 2017 CTA chest 12/10/2023. Spine MRI 12/07/2023. Abdomen pelvis CT 04/13/2023. FINDINGS: Mediastinal blood pool activity: SUV max 2.6 Liver activity: SUV max 2.7 NECK: No specific abnormal radiotracer uptake seen in the neck including along lymph node change of the  submandibular, posterior triangle or internal jugular regions. Near symmetric uptake of the visualized intracranial compartment. Incidental CT  findings: the parotid glands show some fatty atrophy of the left compared to right. The submandibular glands are preserved. Thyroid  gland is slightly small. Diffuse vascular calcifications. There is some streak artifact related to the patient's dental hardware. Opacification of the right mastoid air cells is seen. Please correlate with symptoms. The paranasal sinuses included in the imaging field are preserved. CHEST: No specific abnormal radiotracer uptake seen above blood pool in the axillary regions, hilum or mediastinum. There is some patchy left upper lobe uptake corresponding to some parenchymal opacities with reticulonodular changes. Uptake is maximum SUV of 2.6. Favor infectious or inflammatory process. Please correlate clinical presentation. Incidental CT findings: Status post median sternotomy. Heart is slightly enlarged. No significant pericardial effusion. Thoracic aorta is normal course and caliber with diffuse vascular calcifications. Calcifications as well along the great vessels. Slightly patulous esophagus. Breathing motion. No pneumothorax or effusion. Minimal patchy opacities in the right lung. ABDOMEN/PELVIS: There is physiologic distribution radiotracer along the parenchymal organs, bowel and renal collecting systems. No abnormal nodal uptake. Incidental CT findings: Stomach is underdistended but there is slight wall/fold thickening. Please correlate with symptoms. Small bowel is nondilated. Large bowel is normal course and caliber. There is moderate colonic stool. Surgical changes along the mid sigmoid colon. Dependent stones in the nondilated gallbladder. Bilateral renal atrophy identified, right worse than left. Pole posterior right-sided renal cyst identified measuring 3.5 cm in diameter. This has Hounsfield units of close to 0. Preserved contour to  the urinary bladder. Extensive vascular calcifications seen throughout the abdomen including the aorta and branch vessels. There is also a femoral to femoral artery bypass graft. Multiple vascular stents. Please correlate with clinical history. No obvious free air or free fluid. SKELETON: There are multiple scattered areas of mild asymmetric uptake along the osseous structures. Example in the right upper hemi sacrum has maximum SUV value of 2.4. Small sclerotic area in this location on image 133. Focus upper left hemi pelvis at the iliac bone has area of maximum SUV 2.3 and also area of sclerosis on image 122. There are scattered foci along other areas of the pelvis, numerous vertebral bodies as well as along ribs. Neither example includes L3 vertebral body with maximum SUV 4.3. There are areas uptake along some rib fractures including the posterolateral aspect of the right seventh rib and lateral aspect of the left sixth rib. Please see recent MRI of the spine. Incidental CT findings: Scattered degenerative changes. Compression at T9-10. IMPRESSION: Multifocal mild areas of abnormal uptake along the skeleton including several ribs, vertebral bodies and scattered along the pelvis. Some of these lesions are associated with areas of bony sclerosis. Bilateral rib fractures are seen. Please correlate with clinical history. The right-sided rib fracture appears more subacute in the left more acute. No soft tissue areas of abnormal uptake including along lymph nodes are in the spleen. Slight uptake along patchy left lung opacities and towards the lingula. Please correlate for etiology including infectious or inflammatory process and recommend follow-up. Extensive vascular calcifications identified. Postop chest. 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 Multiple stents. Cardiomegaly. Gallstones. Slight wall thickening of the stomach. Please correlate with any symptoms. Bilateral renal atrophy. Electronically Signed   By:  Adrianna Horde M.D.   On: 01/01/2024 10:13   CT Chest Wo Contrast Result Date: 12/28/2023 CLINICAL DATA:  Chest pain, nonspecific ongoing chest pain, post pna. * Tracking Code: BO * EXAM: CT CHEST WITHOUT CONTRAST TECHNIQUE: Multidetector CT imaging of the chest was performed following the standard  protocol without IV contrast. RADIATION DOSE REDUCTION: This exam was performed according to the departmental dose-optimization program which includes automated exposure control, adjustment of the mA and/or kV according to patient size and/or use of iterative reconstruction technique. COMPARISON:  PET-CT scan from 12/27/2023 and CT angiography chest from 12/10/2023. FINDINGS: Cardiovascular: Normal cardiac size. No pericardial effusion. No aortic aneurysm. There are coronary artery calcifications, in keeping with coronary artery disease. There are also moderate peripheral atherosclerotic vascular calcifications of thoracic aorta and its major branches. 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 Mediastinum/Nodes: Visualized thyroid  gland appears grossly unremarkable. No solid / cystic mediastinal masses. The esophagus is nondistended precluding optimal assessment. There are few mildly prominent mediastinal lymph nodes, which do not meet the size criteria for lymphadenopathy and appear grossly similar to the prior study, favoring benign etiology. No axillary lymphadenopathy by size criteria. Evaluation of bilateral hila is limited due to lack on intravenous contrast: however, no large hilar lymphadenopathy identified. Lungs/Pleura: The central tracheo-bronchial tree is patent. There are patchy, heterogeneous, irregular opacities predominantly involving the left upper lobe, which are new since the prior study. There are also new heterogeneous scattered opacities in the left lung lower lobe. There is continued interval improvement in the opacities in the right upper lobe. Several areas of subsegmental atelectasis  throughout bilateral lungs are essentially unchanged in the interim. No new dense consolidation, lung collapse or pleural effusion. Evaluation for lung nodule is limited due to background lung parenchymal changes. Upper Abdomen: Small volume dependent calcified gallstones noted without imaging signs of acute cholecystitis. Remaining visualized upper abdominal viscera within normal limits. Musculoskeletal: The visualized soft tissues of the chest wall are grossly unremarkable. Redemonstration of mixed sclerotic/lytic lesions in the T7 and T9 vertebral body, essentially similar to the prior study. There is pathological fracture with mild compression deformity of T9 vertebral body. There is also ill-defined lytic lesion in the posterolateral right fourth rib (series 2, image 54), unchanged. Subacute/healing pathological fracture of right seventh rib also appears essentially unchanged. There is acute minimally displaced fracture of the anteromedial left sixth rib (series 2, image 79, which was present on the prior exam as well. There are mild multilevel degenerative changes in the visualized spine. IMPRESSION: 1. There are new heterogeneous opacities in the left lung, as described above, which are nonspecific and may represent multifocal pneumonia. There is continued interval improvement in the opacities in the right upper lobe. 2. Redemonstration of osseous metastases, essentially similar to the prior study, including pathological fracture of T9 vertebral body and right seventh rib. 3. There is minimally displaced fracture of the anteromedial left sixth rib, present since the prior study. No metastatic lesion seen at the fracture site. 4. Multiple other nonacute observations, as described above. Aortic Atherosclerosis (ICD10-I70.0). Electronically Signed   By: Beula Brunswick M.D.   On: 12/28/2023 08:42   DG Chest 2 View Result Date: 12/28/2023 CLINICAL DATA:  Shortness of breath. EXAM: CHEST - 2 VIEW COMPARISON:   12/10/2023 FINDINGS: Low lung volumes. Stable asymmetric elevation left hemidiaphragm. Streaky density at the left base is similar to prior compatible with atelectasis or scarring. Posterior displaced fracture noted in a mid right rib, likely ribs 7. There is some bony callus at the fracture site suggesting nonacute injury. No pneumothorax or substantial pleural effusion. Telemetry leads overlie the chest. IMPRESSION: 1. Low lung volumes with streaky density at the left base compatible with atelectasis or scarring. 2. Nonacute posterior displaced fracture in a mid right rib, likely rib 7.  Electronically Signed   By: Donnal Fusi M.D.   On: 12/28/2023 07:27   IR Fluoro Guide Ndl Plmt / BX Result Date: 12/27/2023 INDICATION: Enhancing abnormality of the T9 vertebral body on recent MRI of the thoracic spine. EXAM: BONE BIOPSY AT T9 MEDICATIONS: None. ANESTHESIA/SEDATION: Moderate (conscious) sedation was employed during this procedure. A total of Versed  2 mg and Fentanyl  50 mcg was administered intravenously by the radiology nurse. Total intra-service moderate Sedation Time: 26 minutes. The patient's level of consciousness and vital signs were monitored continuously by radiology nursing throughout the procedure under my direct supervision. Fluoro time: 6 minutes 48 seconds.  272 mGy. COMPLICATIONS: None immediate. PROCEDURE: Informed written consent was obtained from the patient after a thorough discussion of the procedural risks, benefits and alternatives. All questions were addressed. Maximal Sterile Barrier Technique was utilized including caps, mask, sterile gowns, sterile gloves, sterile drape, hand hygiene and skin antiseptic. A timeout was performed prior to the initiation of the procedure. With the patient prone on the fluoroscopic table, the thoracolumbar region was prepped and draped in the usual manner. The T9 vertebral body was identified on the AP and lateral projection by counting from the sacral  region. The skin overlying the right pedicle was then infiltrated with 0.25% bupivacaine , and extended into the underlying paraspinal musculature. Using biplane intermittent fluoroscopy, a 13 gauge Cook spinal needle was advanced into the posterior aspect of T9. Through this, 2 passes were made with a 16 gauge core biopsy needle. Two core samples were obtained and sent in formalin for pathologic evaluation. Hemostasis was achieved at the skin entry site. Patient was then returned to short-stay for recovery. IMPRESSION: Status post fluoroscopic guided core biopsy at T9 vertebral body as described above without event. Electronically Signed   By: Luellen Sages M.D.   On: 12/27/2023 09:02    Labs:  CBC: Recent Labs    01/02/24 0539 01/03/24 0407 01/03/24 1224 01/13/24 1551 01/14/24 0242  WBC 18.1* 13.1*  --  34.4* 27.1*  HGB 9.1* 8.2* 9.3* 8.2* 7.8*  HCT 28.9* 25.8* 27.9* 24.5* 25.4*  PLT 456* 398  --  356 321    COAGS: Recent Labs    12/26/23 0805 01/01/24 2039 01/13/24 1551  INR 1.1 1.1 1.1  APTT  --  29  --     BMP: Recent Labs    01/02/24 0539 01/03/24 0407 01/13/24 1551 01/14/24 0242  NA 138 133* 131* 134*  K 4.8 3.9 4.2 4.1  CL 104 103 99 102  CO2 20* 21* 22 23  GLUCOSE 225* 230* 105* 156*  BUN 62* 56* 32* 30*  CALCIUM  9.5 8.6* 8.2* 8.2*  CREATININE 2.47* 2.10* 2.37* 2.22*  GFRNONAA 26* 32* 28* 30*    LIVER FUNCTION TESTS: Recent Labs    12/11/23 0420 01/01/24 0417 01/02/24 0539 01/13/24 1551  BILITOT 0.4 0.8 0.8 0.8  AST 11* 16 22 12*  ALT 7 10 15 13   ALKPHOS 74 63 72 81  PROT 5.9* 6.0* 6.9 5.9*  ALBUMIN  2.8* 2.8* 3.2* 2.8*    TUMOR MARKERS: No results for input(s): "AFPTM", "CEA", "CA199", "CHROMGRNA" in the last 8760 hours.  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 T9 lesion biopsy on 5/14 was non-diagnostic. Patient was initially to undergo bone marrow biopsy today. However, patient was admitted  yesterday at [facility] with multifocal pneumonia. Plan currently is to transfer [facility] to [facility] today  for L3 biopsy by Dr. Alvira Josephs.  Patient presents for scheduled L3 bone lesion biopsy in IR today.  Patient has been NPO since midnight.  All labs and medications are within acceptable parameters.  Last dose Lovenox  on 6/1 around 9pm. No pertinent allergies.   Risks and benefits of L3 vertebra biopsy was discussed with the patient and/or patient's family including, but not limited to bleeding, infection, damage to adjacent structures or low yield requiring additional tests.  All of the questions were answered and there is agreement to proceed.  Consent signed and in chart.    Thank you for allowing our service to participate in [name] 's care.  Electronically Signed: Lovena Rubinstein, PA-C   01/14/2024, 10:35 PM      I spent a total of 40 Minutes in face to face in clinical consultation, greater than 50% of which was counseling/coordinating care for suspected metastatic cancer to bone, with consideration for L3 biopsy.

## 2024-01-14 NOTE — Evaluation (Signed)
 Clinical/Bedside Swallow Evaluation Patient Details  Name: [name] MRN: [id number] Date of Birth: [birth year]  Today's Date: 01/14/2024 Time: SLP Start Time (ACUTE ONLY): 0932 SLP Stop Time (ACUTE ONLY): 0954 SLP Time Calculation (min) (ACUTE ONLY): 22 min  Past Medical History:  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   x 5   Hypercholesteremia    Hypertension    Neuropathy    Peripheral vascular disease (HCC)    s/p R-L FEM-FEM BYPASS   Pneumonia    Tachycardia    after CABG, pt on medicine for this   Past Surgical History:  Past Surgical History:  Procedure Laterality Date   ABDOMINAL AORTOGRAM Bilateral 09/19/2016   Procedure: iliac;  Surgeon: Margherita Shell, MD;  [facility];  Service: Cardiovascular;  Laterality: Bilateral;   ABDOMINAL AORTOGRAM W/LOWER EXTREMITY N/A 09/22/2021   Procedure: ABDOMINAL AORTOGRAM W/LOWER EXTREMITY;  Surgeon: Young Hensen, MD;  Location: [facility];  Service: Cardiovascular;  Laterality: N/A;   ABDOMINAL AORTOGRAM W/LOWER EXTREMITY N/A 10/05/2022   Procedure: ABDOMINAL AORTOGRAM W/LOWER EXTREMITY;  Surgeon: Young Hensen, MD;  [facility]: [facility];  Service: Cardiovascular;  Laterality: N/A;   BIOPSY  05/16/2023   Procedure: BIOPSY;  Surgeon: Suzette Espy, MD;  [facility];  Service: Endoscopy;;   CARDIAC CATHETERIZATION  2003   with stent   CARDIAC CATHETERIZATION N/A 10/11/2015   Procedure: Left Heart Cath and Coronary Angiography;  Surgeon: Peter M Swaziland, MD;  Location: [facility] INVASIVE CV LAB;  Service: Cardiovascular;  Laterality: N/A;   COLON  RESECTION N/A 12/13/2017   Procedure: EXPLORATORY LAPAROTOMY, SIGMOID COLECTOMY WITH COLOSTOMY;  Surgeon: Dareen Ebbing, MD;  Location: [facility] OR;  Service: General;  Laterality: N/A;   COLONOSCOPY N/A 09/22/2013   Procedure: COLONOSCOPY;  Surgeon: Suzette Espy, MD;  [facility];  Service: Endoscopy;  Laterality: N/A;  9:30 AM   COLONOSCOPY N/A 02/19/2018   Procedure: COLONOSCOPY;  Surgeon: Suzette Espy, MD;  [facility];  Service: Endoscopy;  Laterality: N/A;  2:00pm   COLONOSCOPY WITH PROPOFOL  N/A 05/16/2023   Procedure: COLONOSCOPY WITH PROPOFOL ;  Surgeon: Suzette Espy, MD;  [facility];  Service: Endoscopy;  Laterality: N/A;  730am, asa 3   COLOSTOMY N/A 12/13/2017   Procedure: COLOSTOMY;  Surgeon: Dareen Ebbing, MD;  Location: [facility] OR;  Service: General;  Laterality: N/A;   COLOSTOMY REVERSAL N/A 03/26/2018   Procedure: COLOSTOMY REVERSAL;  Surgeon: Dareen Ebbing, MD;  [facility]: [facility];  Service: General;  Laterality: N/A;   CORONARY ARTERY BYPASS GRAFT N/A 10/18/2015   Procedure: CORONARY ARTERY BYPASS GRAFTING (CABG) x  five, using left internal mammary artery and right leg greater saphenous vein harvested endoscopically;  Surgeon: Zelphia Higashi, MD;  Location: [facility];  Service: Open Heart Surgery;  Laterality: N/A;   ENDARTERECTOMY FEMORAL Right 08/09/2016   Procedure: ENDARTERECTOMY FEMORAL WITH VEIN PATCH ANGIOPLASTY;  Surgeon: Margherita Shell, MD;  [facility];  Service: Vascular;  Laterality: Right;   ESOPHAGOGASTRODUODENOSCOPY N/A 10/24/2017   Dr. Riley Cheadle: hiatal hernia  ESOPHAGOGASTRODUODENOSCOPY N/A 01/01/2024   Procedure: EGD (ESOPHAGOGASTRODUODENOSCOPY);  Surgeon: Hargis Lias, MD;  [facility];  Service: Endoscopy;  Laterality: N/A;   ESOPHAGOGASTRODUODENOSCOPY (EGD) WITH PROPOFOL  N/A 05/16/2023   Procedure: ESOPHAGOGASTRODUODENOSCOPY (EGD) WITH PROPOFOL ;  Surgeon: Suzette Espy, MD;  [facility];   Service: Endoscopy;  Laterality: N/A;   FEMORAL-FEMORAL BYPASS GRAFT Bilateral 08/09/2016   Procedure: REVISION BYPASS GRAFT RIGHT FEMORAL-LEFT FEMORAL ARTERY;  Surgeon: Margherita Shell, MD;  Location: [facility] OR;  Service: Vascular;  Laterality: Bilateral;   FEMORAL-POPLITEAL BYPASS GRAFT     INTRAMEDULLARY (IM) NAIL INTERTROCHANTERIC Left 04/03/2021   Procedure: LEFT  HIP INTRAMEDULLARY (IM) NAIL INTERTROCHANTRIC;  Surgeon: Arnie Lao, MD;  [facility]: [facility];  Service: Orthopedics;  Laterality: Left;   IR FLUORO GUIDED NEEDLE PLC ASPIRATION/INJECTION LOC  12/26/2023   MALONEY DILATION N/A 05/16/2023   Procedure: MALONEY DILATION;  Surgeon: Suzette Espy, MD;  [facility];  Service: Endoscopy;  Laterality: N/A;   PERIPHERAL VASCULAR BALLOON ANGIOPLASTY Right 09/22/2021   Procedure: PERIPHERAL VASCULAR BALLOON ANGIOPLASTY;  Surgeon: Young Hensen, MD;  [facility];  Service: Cardiovascular;  Laterality: Right;   PERIPHERAL VASCULAR CATHETERIZATION N/A 05/08/2016   Procedure: Lower Extremity Angiography;  Surgeon: Avanell Leigh, MD;  Location: [facility] INVASIVE CV LAB;  Service: Cardiovascular;  Laterality: N/A;   PERIPHERAL VASCULAR INTERVENTION Right 09/19/2016   Procedure: Peripheral Vascular Intervention;  Surgeon: Margherita Shell, MD;  [facility];  Service: Cardiovascular;  Laterality: Right;  ext iliac stent   PERIPHERAL VASCULAR INTERVENTION Right 09/22/2021   Procedure: PERIPHERAL VASCULAR INTERVENTION;  Surgeon: Young Hensen, MD;  [facility];  Service: Cardiovascular;  Laterality: Right;   POLYPECTOMY  02/19/2018   Procedure: POLYPECTOMY;  Surgeon: Suzette Espy, MD;  [facility];  Service: Endoscopy;;   PROCTOSCOPY  10/24/2017   Procedure: PROCTOSCOPY;  Surgeon: Suzette Espy, MD;  [facility];  Service: Endoscopy;;   TEE WITHOUT CARDIOVERSION N/A 10/18/2015   Procedure:  TRANSESOPHAGEAL ECHOCARDIOGRAM (TEE);  Surgeon: Zelphia Higashi, MD;  Location: [facility] OR;  Service: Open Heart Surgery;  Laterality: N/A;   HPI:  Pt is a 78 y.o. male with PMHx of PNA (2019, April 2025, and May 2025), Bell's Palsy (2019), previous esophogram 10/16/2017 was [facility] except for prominent CP bar. Daughter reports he was recently diagnosed with COPD (unable to verify in chart). Other medical hx includes coronary artery disease status post CABG, hypertension, diabetes mellitus type 2, hyperlipidemia, CKD stage IV, diverticulitis status post colostomy and subsequent takedown, atrial tachycardia, peripheral vascular disease, TIA. Pt recently in the hospital from 12/07/2023 - 12/10/2023 as well as 12/28/2023 - 01/03/2024 for treatment of PNA.   Chest x-ray 01/13/24: "Increased patchy opacities at the left mid to upper lung zone. Similar streaky opacities in the right upper lung and slightly increased streaky right basilar opacities. These findings could reflect multifocal pneumonia, aspiration, or edema."    Assessment / Plan / Recommendation  Clinical Impression  A bedside swallow evaluation was conducted via administration of ice, thin liquids via spoon, cup, and straw, mixed consistency/mech soft peaches via spoon, and regular graham cracker. Oral mech was unremarkable aside from pt missing top dentition (typically wears partials that were not present). Pt was NPO prior to BSE but baseline diet is reg/thin. Pt on room air but recently required supplemental O2 per Nursing. Pt denied odynophagia, globus sensation, difficulty  with mastication, or previous MBSS. Pt endorses significant GERD for which he takes medication. Daughter states he had esophageal dilation in October 2024. Pt situated himself to upright position on edge of bed and fed himself during PO trials. No overt s/sx of aspiration observed across all consistencies.  No oral cavity residue observed. Mastication of solids was timely and thorough  despite dentition issues. Belching x2 noted following trials of thin via straw concerning for esophageal dysphagia and consistent with documented esophageal issues.   Pt presents with oropharyngeal function that appears to be largely [facility]. Recommend reg/thin with compensatory strategies of small sip/bites, slow rate, upright during PO and 30 min following. Although pt did not have clinical s/sx of aspiration at bedside, pt has an extensive hx of recent PNA (recent chest x-ray questioned aspiration), hx of a CP bar, reported dx of COPD, and significant GERD, all of which place him at a higher risk of aspiration. Given this, it is recommended that an MBSS be conducted to further assess oropharyngeal function. SLP messaged Dr. Michaelene Admire to inform of BSE results and rec of MBSS. ST to follow to assess for diet tolerance/safety, reinforce aspiration precautions, and provide family/patient education.   SLP Visit Diagnosis: Dysphagia, unspecified (R13.10)    Aspiration Risk  Moderate aspiration risk (d/t reported COPD dx, GERD, and hx of frequent PNA)    Diet Recommendation Regular;Thin liquid    Liquid Administration via: Cup;Straw;Spoon Medication Administration: Whole meds with liquid Supervision: Patient able to self feed Compensations: Slow rate;Small sips/bites    Other  Recommendations Oral Care Recommendations: Oral care BID;Patient independent with oral care;Oral care before and after PO    Recommendations for follow up therapy are one component of a multi-disciplinary discharge planning process, led by the attending physician.  Recommendations may be updated based on patient status, additional functional criteria and insurance authorization.  Follow up Recommendations ST to follow while inpatient. No SLP follow up at next level of care indicated at this time.      Assistance Recommended at Discharge  None  Functional Status Assessment Patient has had a recent decline in their functional status  and demonstrates the ability to make significant improvements in function in a reasonable and predictable amount of time.  Frequency and Duration min 1 x/week  1 week       Prognosis Prognosis for improved oropharyngeal function: Good      Swallow Study   General HPI: Pt is a 78 y.o. male with PMHx of PNA (2019, April 2025, and May 2025), Bell's Palsy (2019), previous esophogram 10/16/2017 was [facility] except for prominent CP bar. Daughter reports he was recently diagnosed with COPD (unable to verify in chart). Other medical hx includes coronary artery disease status post CABG, hypertension, diabetes mellitus type 2, hyperlipidemia, CKD stage IV, diverticulitis status post colostomy and subsequent takedown, atrial tachycardia, peripheral vascular disease, TIA. Pt recently in the hospital from 12/07/2023 - 12/10/2023 as well as 12/28/2023 - 01/03/2024 for treatment of PNA. Chest x-ray 01/13/24: "Increased patchy opacities at the left mid to upper lung zone.  Similar streaky opacities in the right upper lung and slightly increased streaky right basilar opacities. These findings could reflect multifocal pneumonia, aspiration, or edema." Type of Study: Bedside Swallow Evaluation Previous Swallow Assessment: BSE 2019 Diet Prior to this Study: Regular;Thin liquids (Level 0) Temperature Spikes Noted: Yes Respiratory Status: Room air History of Recent Intubation: No Behavior/Cognition: Alert;Cooperative;Pleasant mood Oral Cavity Assessment: Within Functional Limits Oral Care Completed by SLP: Recent completion  by staff Oral Cavity - Dentition: Missing dentition (Natural teeth on bottom, wears partials on top but not present) Vision: Functional for self-feeding Self-Feeding Abilities: Able to feed self Patient Positioning: Upright in bed;Postural control adequate for testing Baseline Vocal Quality: Normal Volitional Cough: Strong Volitional Swallow: Able to elicit    Oral/Motor/Sensory Function Overall Oral  Motor/Sensory Function: Within functional limits   Ice Chips Ice chips: Within functional limits Presentation: Spoon   Thin Liquid Thin Liquid: Within functional limits Presentation: Cup;Self Fed;Straw;Spoon    Nectar Thick Nectar Thick Liquid: Not tested   Honey Thick Honey Thick Liquid: Not tested   Puree Puree: Not tested   Solid     Solid: Within functional limits Presentation: Self Fed;Spoon      Caretha Chapel, MA CCC-SLP 01/14/2024,12:15 PM

## 2024-01-14 NOTE — Progress Notes (Signed)
 Progress Note   Patient: [name] OZH:[id number] DOB: [birth year] DOA: 01/13/2024     1 DOS: the patient was seen and examined on 01/14/2024   Brief hospital admission narrative: As per H&P written by Dr. Forrest Dunn on 01/13/2024 [name] is an 78 y.o. male with a history of coronary artery disease status post CABG, hypertension, diabetes mellitus type 2, hyperlipidemia, CKD stage IV, diverticulitis status post colostomy and subsequent takedown, atrial tachycardia, peripheral vascular disease, TIA who was recently in the hospital from 12/28/2023 - 01/03/2024 for treatment of pneumonia.  Prior to that, he was also in the hospital 12/07/2023 - 12/10/2023 for pneumonia and was discharged on levofloxacin .  During that admission, a CT scan of the chest done on 12/10/2023 showed a pathological fracture of the T9 vertebra and an MRI scan subsequently showed lytic bone lesions involving the thoracic, lumbar, and sacral bones.  He was referred to Dr. Katragadda for follow-up who performed a biopsy of the T9 vertebral lesion on 12/26/2023 but unfortunately no tissue diagnosis could be made from that biopsy.  His family also tells me that imaging studies have shown that he has had a stroke though he never had symptoms of a stroke.  A multiple myeloma panel done on 12/07/2023 showed elevated alpha-2 globulin at 1.3 and an M spike was not observed.  Urine protein electrophoresis on 12/07/2023 was normal.  Tumor markers including AFP, CA 19-9 and CEA were not remarkable and his PSA was most recently 3.89.  His kappa and lambda free light chains were elevated but the kappa/lambda ratio was normal on 12/18/2023.  [name] is planning to do a bone marrow biopsy next week.  Since his most recent discharge from the hospital on 01/03/2024, the patient has not cleared his symptoms.  He continues to complain of cough productive of thick sputum but without color, fever and chills, and general malaise.  Family also reports some intermittent  confusion.   ED Course:  [name] vital signs were: Blood pressure (!) 158/60, pulse 84, temperature 98.4 F (36.9 C), temperature source Oral, resp. rate 17, height 5\' 7"  (1.702 m), weight 68 kg, SpO2 92%.  Chest x-ray showed increased patchy opacities at the left mid to upper lung zone; similar streaky opacities in the right upper lung and slightly increased streaky right basilar opacities concerning for multifocal pneumonia versus aspiration/edema.  Respiratory panel was negative for COVID and influenza.  MRSA nasal screening was negative.  Lactic acid was normal.  Chemistries were notable for sodium of 131, BUN 32, creatinine 2.37 (consistent with his normal baseline value) corresponding to a GFR of 28.  WBC was notable for an elevated level of 34.4 (was 13.110 days prior) with a shift to the left. [name] was given 1.5 L of lactated Ringer 's and he was started on meropenem and vancomycin  to cover hospital-acquired pathogens.  Assessment and plan 1-multifocal pneumonia with concern for aspiration -Patient has been seen by speech therapy with recommendation for regular diet and thin liquids - Currently not requiring oxygen supplementation - Patient did not meet criteria for sepsis - Antibiotics will be narrowed to cefepime  and Flagyl  - Continue as needed bronchodilators - Continue mucolytic's therapy - Will follow culture results and clinical response. - Continue supportive care.  2-peripheral vascular disease - Aspirin  and Plavix  on hold pending bone marrow biopsy scheduled for 01/15/2024.  3-essential hypertension - Continue Norvasc , losartan  and metoprolol . - Vital signs stable - Heart healthy/low-sodium diet discussed with patient.  4-peripheral  neuropathy - Continue treatment with Neurontin   5-uncontrolled type 2 diabetes - Recent A1c 7.6 - Continue sliding scale insulin  and follow CBG fluctuation.  6-coronary artery disease - No chest pain - Aspirin  and Plavix  remain on  hold in anticipation for bone marrow biopsy as mentioned above - Resume once clear by IR service.  7-suspected metastatic cancer to the bone - Bone marrow biopsy is scheduled for 01/15/2024 - Questionable multiple myeloma - Patient actively following by oncology service. - As needed Tylenol  and oxycodone  will be added to help with pain management.  8-chronic kidney disease stage IV - Appears to be stable and at baseline currently - Continue to follow renal function trend - Maintain adequate hydration - Minimize nephrotoxic agents and the use of contrast.   Subjective:  Afebrile, no requiring oxygen supplementation on today's examination; reported intermittent mildly productive coughing spells.  Physical Exam: Vitals:   01/14/24 0006 01/14/24 0417 01/14/24 0728 01/14/24 1253  BP: (!) 141/55 (!) 142/54 (!) 160/62 (!) 169/65  Pulse: 75 75 76 84  Resp: 17 19    Temp: 98.3 F (36.8 C) 98.3 F (36.8 C) 98.9 F (37.2 C) 98.5 F (36.9 C)  TempSrc:   Oral Oral  SpO2: 97% 94% 91% 95%  Weight:      Height:       General exam: Alert, awake, oriented x 3; following commands and in no acute distress. Respiratory system: Positive rhonchi bilaterally; no wheezing. Cardiovascular system:RRR. No rub or gallop; no JVD. Gastrointestinal system: Abdomen is nondistended, soft and nontender. No organomegaly or masses felt. Normal bowel sounds heard. Central nervous system: No focal neurological deficits. Extremities: No cyanosis or clubbing. Skin: No petechiae. Psychiatry: Judgement and insight appear normal. Mood & affect appropriate.    Data Reviewed: Basic metabolic panel: Sodium 134, potassium 4.1, chloride 102, bicarb 23, BUN 30, creatinine 2.22 and GFR 30 CBC: White blood cells 27.1, hemoglobin 7.8 and platelet count 321K  Family Communication: Daughter at bedside.  Disposition: Status is: Inpatient Remains inpatient appropriate because: No IV antibiotics.  Anticipate discharge  back home once medically stable.  Time spent: 50 minutes  Author: Justina Oman, MD 01/14/2024 4:13 PM  For on call review [email or web address].

## 2024-01-15 ENCOUNTER — Inpatient Hospital Stay (HOSPITAL_COMMUNITY)
Admission: RE | Admit: 2024-01-15 | Discharge: 2024-01-15 | Disposition: A | Discharge status: Home / Self Care | Source: Ambulatory Visit | Attending: Hematology | Admitting: Hematology

## 2024-01-15 DIAGNOSIS — I10 Essential (primary) hypertension: Secondary | ICD-10-CM | POA: Diagnosis not present

## 2024-01-15 DIAGNOSIS — E1165 Type 2 diabetes mellitus with hyperglycemia: Secondary | ICD-10-CM | POA: Diagnosis not present

## 2024-01-15 DIAGNOSIS — C7951 Secondary malignant neoplasm of bone: Secondary | ICD-10-CM

## 2024-01-15 DIAGNOSIS — J189 Pneumonia, unspecified organism: Secondary | ICD-10-CM | POA: Diagnosis not present

## 2024-01-15 DIAGNOSIS — N184 Chronic kidney disease, stage 4 (severe): Secondary | ICD-10-CM | POA: Diagnosis not present

## 2024-01-15 HISTORY — PX: IR IVC FILTER RETRIEVAL / S&I /IMG GUID/MOD SED: IMG5308

## 2024-01-15 LAB — GLUCOSE, CAPILLARY
Glucose-Capillary: 137 mg/dL — ABNORMAL HIGH (ref 70–99)
Glucose-Capillary: 143 mg/dL — ABNORMAL HIGH (ref 70–99)
Glucose-Capillary: 163 mg/dL — ABNORMAL HIGH (ref 70–99)
Glucose-Capillary: 220 mg/dL — ABNORMAL HIGH (ref 70–99)
Glucose-Capillary: 231 mg/dL — ABNORMAL HIGH (ref 70–99)
Glucose-Capillary: 349 mg/dL — ABNORMAL HIGH (ref 70–99)

## 2024-01-15 LAB — PROTIME-INR
INR: 1.2 (ref 0.8–1.2)
Prothrombin Time: 15.1 s (ref 11.4–15.2)

## 2024-01-15 MED ORDER — BUPIVACAINE HCL (PF) 0.5 % IJ SOLN
INTRAMUSCULAR | Status: AC
  Filled 2024-01-15: qty 30

## 2024-01-15 MED ORDER — MIDAZOLAM HCL 2 MG/2ML IJ SOLN
INTRAMUSCULAR | Status: AC
  Filled 2024-01-15: qty 2

## 2024-01-15 MED ORDER — BUPIVACAINE HCL (PF) 0.5 % IJ SOLN
30.0000 mL | Freq: Once | INTRAMUSCULAR | Status: AC
  Administered 2024-01-15: 20 mL

## 2024-01-15 MED ORDER — FENTANYL CITRATE (PF) 100 MCG/2ML IJ SOLN
INTRAMUSCULAR | Status: AC | PRN
  Administered 2024-01-15 (×3): 25 ug via INTRAVENOUS

## 2024-01-15 MED ORDER — MIDAZOLAM HCL 2 MG/2ML IJ SOLN
INTRAMUSCULAR | Status: AC | PRN
  Administered 2024-01-15 (×2): 1 mg via INTRAVENOUS

## 2024-01-15 MED ORDER — FENTANYL CITRATE (PF) 100 MCG/2ML IJ SOLN
INTRAMUSCULAR | Status: AC
  Filled 2024-01-15: qty 2

## 2024-01-15 NOTE — Progress Notes (Signed)
 SLP Cancellation Note  Patient Details Name: LAKEN LOBATO MRN: [id number] DOB: July 26, 1946   Cancelled treatment:        SLP attempted to see pt for f/u ST visit at approximately 0930. Pt was seen being transported to [facility] for procedure and was unavailable. Due to scheduling, will continue attempts tomorrow, 6/4. SLP informed Nursing.   Caretha Chapel, MA CCC-SLP Speech-Language Pathologist 01/15/2024, 9:46 AM

## 2024-01-15 NOTE — Plan of Care (Signed)
  Problem: SLP Dysphagia Goals Goal: Misc Dysphagia Goal Flowsheets (Taken 01/15/2024 0906) Misc Dysphagia Goal: Pt will participate in an objective swallow study to further assess oropharyngeal function and assess aspiration risk.   Problem: SLP Dysphagia Goals Goal: Patient will utilize recommended strategies Description: Patient will utilize recommended strategies during swallow to increase swallowing safety with Flowsheets (Taken 01/15/2024 0906) Patient will utilize recommended strategies during swallow to increase swallowing safety with: independently

## 2024-01-15 NOTE — Procedures (Signed)
 INR.  Status post fluoroscopic guided core biopsy at  L3.  Right transpedicular approach.  3 passes made with retrieval of approximately 8 cc of mostly bloody with a few chunks of soft tissue.  Biopsy material sent for pathologic analysis as per request of the referring physician.  No acute complications.  Patient tolerated the procedure well.  Jory Ng MD

## 2024-01-15 NOTE — Progress Notes (Signed)
 Progress Note   Patient: [name] ZOX:[id number] DOB: Mar 19, 1946 DOA: 01/13/2024     2 DOS: the patient was seen and examined on 01/15/2024   Brief hospital admission narrative: As per H&P written by Dr. Forrest Iha on 01/13/2024 [name] is an 78 y.o. male with a history of coronary artery disease status post CABG, hypertension, diabetes mellitus type 2, hyperlipidemia, CKD stage IV, diverticulitis status post colostomy and subsequent takedown, atrial tachycardia, peripheral vascular disease, TIA who was recently in the hospital from 12/28/2023 - 01/03/2024 for treatment of pneumonia.  Prior to that, he was also in the hospital 12/07/2023 - 12/10/2023 for pneumonia and was discharged on levofloxacin .  During that admission, a CT scan of the chest done on 12/10/2023 showed a pathological fracture of the T9 vertebra and an MRI scan subsequently showed lytic bone lesions involving the thoracic, lumbar, and sacral bones.  He was referred to Dr. Katragadda for follow-up who performed a biopsy of the T9 vertebral lesion on 12/26/2023 but unfortunately no tissue diagnosis could be made from that biopsy.  His family also tells me that imaging studies have shown that he has had a stroke though he never had symptoms of a stroke.  A multiple myeloma panel done on 12/07/2023 showed elevated alpha-2 globulin at 1.3 and an M spike was not observed.  Urine protein electrophoresis on 12/07/2023 was normal.  Tumor markers including AFP, CA 19-9 and CEA were not remarkable and his PSA was most recently 3.89.  His kappa and lambda free light chains were elevated but the kappa/lambda ratio was normal on 12/18/2023.  Dr. Cheree Cords is planning to do a bone marrow biopsy next week.  Since his most recent discharge from the hospital on 01/03/2024, the patient has not cleared his symptoms.  He continues to complain of cough productive of thick sputum but without color, fever and chills, and general malaise.  Family also reports some intermittent  confusion.   ED Course:  [name] vital signs were: Blood pressure (!) 158/60, pulse 84, temperature 98.4 F (36.9 C), temperature source Oral, resp. rate 17, height 5\' 7"  (1.702 m), weight 68 kg, SpO2 92%.  Chest x-ray showed increased patchy opacities at the left mid to upper lung zone; similar streaky opacities in the right upper lung and slightly increased streaky right basilar opacities concerning for multifocal pneumonia versus aspiration/edema.  Respiratory panel was negative for COVID and influenza.  MRSA nasal screening was negative.  Lactic acid was normal.  Chemistries were notable for sodium of 131, BUN 32, creatinine 2.37 (consistent with his normal baseline value) corresponding to a GFR of 28.  WBC was notable for an elevated level of 34.4 (was 13.110 days prior) with a shift to the left. [name] was given 1.5 L of lactated Ringer 's and he was started on meropenem and vancomycin  to cover hospital-acquired pathogens.  Assessment and plan 1-multifocal pneumonia with concern for aspiration -Patient has been seen by speech therapy with recommendation for regular diet and thin liquids - Currently not requiring oxygen supplementation - Patient did not meet criteria for sepsis - Continue narrowed antibiotics (cefepime  and Flagyl ) - Continue as needed bronchodilators - Continue mucolytic's therapy - Will continue to follow culture results and clinical response. - Continue supportive care and follow clinical response  2-peripheral vascular disease - Aspirin  and Plavix  on hold pending bone marrow biopsy scheduled for 01/15/2024.  3-essential hypertension - Continue Norvasc , losartan  and metoprolol . - Vital signs stable - Heart healthy/low-sodium diet discussed  with patient.  4-peripheral neuropathy - Continue treatment with Neurontin   5-uncontrolled type 2 diabetes - Recent A1c 7.6 - Continue sliding scale insulin  and follow CBG fluctuation.  6-coronary artery disease - No chest  pain - Aspirin  and Plavix  remain on hold in anticipation for bone marrow biopsy as mentioned above - Planning to resume once clear by IR service.  7-suspected metastatic cancer to the bone - Bone marrow biopsy is scheduled for later today 01/15/2024 - Questionable multiple myeloma - Patient actively following by oncology service. - As needed Tylenol  and oxycodone  will be added to help with pain management.  8-chronic kidney disease stage IV - Appears to be stable and at baseline currently - Continue to follow renal function trend - Maintain adequate hydration - Minimize nephrotoxic agents and the use of contrast.   Subjective:  Afebrile; good saturation on room air.  No nausea or vomiting.  Intermittent mildly productive coughing spells reported.  Is present to be hungry while n.p.o. pending biopsy evaluation.  Physical Exam: Vitals:   01/14/24 1253 01/14/24 2004 01/14/24 2230 01/15/24 0326  BP: (!) 169/65 (!) 171/66 (!) 171/66 (!) 149/77  Pulse: 84 85 85 78  Resp:  20  18  Temp: 98.5 F (36.9 C) 98.6 F (37 C)  98.2 F (36.8 C)  TempSrc: Oral Oral  Oral  SpO2: 95% 96%  94%  Weight:      Height:       General exam: Alert, awake, oriented x 3; no requiring oxygen supplementation; following commands appropriately.  Expressing to be hungry. Respiratory system: Positive rhonchi bilaterally; no using accessory muscles.  Good saturation on room air. Cardiovascular system:RRR. No rub or gallop; no JVD. Gastrointestinal system: Abdomen is nondistended, soft and nontender. No organomegaly or masses felt. Normal bowel sounds heard. Central nervous system: Moving 4 limbs spontaneously.  No focal neurological deficits. Extremities: No cyanosis or clubbing; reporting back pain. Skin: No petechiae. Psychiatry: Judgement and insight appear normal. Mood & affect appropriate.   Latest data Reviewed: Basic metabolic panel: Sodium 134, potassium 4.1, chloride 102, bicarb 23, BUN 30,  creatinine 2.22 and GFR 30 CBC: White blood cells 27.1, hemoglobin 7.8 and platelet count 321K  Family Communication: No family at bedside on today's examination  Disposition: Status is: Inpatient Remains inpatient appropriate because: No IV antibiotics.  Anticipate discharge back home once medically stable. Time spent: 50 minutes  Author: Justina Oman, MD 01/15/2024 5:21 PM  For on call review [email or web address].

## 2024-01-15 NOTE — Plan of Care (Signed)
   Problem: Health Behavior/Discharge Planning: Goal: Ability to manage health-related needs will improve Outcome: Progressing   Problem: Clinical Measurements: Goal: Will remain free from infection Outcome: Progressing   Problem: Activity: Goal: Risk for activity intolerance will decrease Outcome: Progressing

## 2024-01-16 ENCOUNTER — Inpatient Hospital Stay (HOSPITAL_COMMUNITY)

## 2024-01-16 DIAGNOSIS — N184 Chronic kidney disease, stage 4 (severe): Secondary | ICD-10-CM | POA: Diagnosis not present

## 2024-01-16 DIAGNOSIS — E1165 Type 2 diabetes mellitus with hyperglycemia: Secondary | ICD-10-CM | POA: Diagnosis not present

## 2024-01-16 DIAGNOSIS — J189 Pneumonia, unspecified organism: Secondary | ICD-10-CM | POA: Diagnosis not present

## 2024-01-16 DIAGNOSIS — I10 Essential (primary) hypertension: Secondary | ICD-10-CM | POA: Diagnosis not present

## 2024-01-16 LAB — GLUCOSE, CAPILLARY
Glucose-Capillary: 111 mg/dL — ABNORMAL HIGH (ref 70–99)
Glucose-Capillary: 133 mg/dL — ABNORMAL HIGH (ref 70–99)
Glucose-Capillary: 149 mg/dL — ABNORMAL HIGH (ref 70–99)
Glucose-Capillary: 170 mg/dL — ABNORMAL HIGH (ref 70–99)
Glucose-Capillary: 264 mg/dL — ABNORMAL HIGH (ref 70–99)
Glucose-Capillary: 371 mg/dL — ABNORMAL HIGH (ref 70–99)

## 2024-01-16 NOTE — Evaluation (Addendum)
 Modified Barium Swallow Study  Patient Details  Name: [name] MRN: [id number] Date of Birth: 1946-05-17  Today's Date: 01/16/2024  Modified Barium Swallow completed.  Full report located under Chart Review in the Imaging Section.  History of Present Illness Pt is a 78 y.o. male with PMHx of PNA (2019, April 2025, and May 2025), Bell's Palsy (2019), previous esophogram 10/16/2017 was [facility] except for prominent CP bar. Daughter reports he was recently diagnosed with COPD (unable to verify in chart). Other medical hx includes coronary artery disease status post CABG, hypertension, diabetes mellitus type 2, hyperlipidemia, CKD stage IV, diverticulitis status post colostomy and subsequent takedown, atrial tachycardia, peripheral vascular disease, TIA. Pt recently in the hospital from 12/07/2023 - 12/10/2023 as well as 12/28/2023 - 01/03/2024 for treatment of PNA.   Chest x-ray 01/13/24: "Increased patchy opacities at the left mid to upper lung zone. Similar streaky opacities in the right upper lung and slightly increased streaky right basilar opacities. These findings could reflect multifocal pneumonia, aspiration, or edema."  Clinical Impression Pt presents with oropharyngeal function that is [facility] with no observed aspiration across consistencies. Oral phase was timely and unremarkable. Flash penetration into laryngeal vestibule during the swallow observed with thin liquids during consecutive sips administered via cup and straw. Slightly reduced base of tongue retraction leading to trace BOT residue. Slightly diminished pharyngeal stripping wave resulting in trace pharyngeal residue in the valleculae, which cleared with subsequent reflexive swallow. Hyolaryngeal excusion was intact. Esophageal sweep was limited but unremarkable. Recommend continue regular textures with thin liquids with safe swallow precautions of small bites/sips, slow rate, alternate solids and liquids, upright for all PO and for at least 30  minute after PO.  No further ST needs in acute setting. SLP will sign off at this time. Thank you for the referral.   Factors that may increase risk of adverse event in presence of aspiration Roderick Civatte & Jessy Morocco 2021): Poor general health and/or compromised immunity;Respiratory or GI disease  Swallow Evaluation Recommendations Recommendations: PO diet Liquid Administration via: Cup;Straw Medication Administration: Whole meds with liquid Supervision: Patient able to self-feed Swallowing strategies  : Slow rate;Small bites/sips;Follow solids with liquids Postural changes: Position pt fully upright for meals;Stay upright 30-60 min after meals Oral care recommendations: Oral care BID (2x/day)   Caretha Chapel, MA CCC-SLP Speech-Language Pathologist 01/16/2024,3:44 PM

## 2024-01-16 NOTE — Progress Notes (Signed)
 Progress Note   Patient: [name] UJW:[id number] DOB: [birth year] DOA: 01/13/2024     3 DOS: the patient was seen and examined on 01/16/2024   Brief hospital admission narrative: As per H&P written by Dr. Forrest Iha on 01/13/2024 [name] is an 78 y.o. male with a history of coronary artery disease status post CABG, hypertension, diabetes mellitus type 2, hyperlipidemia, CKD stage IV, diverticulitis status post colostomy and subsequent takedown, atrial tachycardia, peripheral vascular disease, TIA who was recently in the hospital from 12/28/2023 - 01/03/2024 for treatment of pneumonia.  Prior to that, he was also in the hospital 12/07/2023 - 12/10/2023 for pneumonia and was discharged on levofloxacin .  During that admission, a CT scan of the chest done on 12/10/2023 showed a pathological fracture of the T9 vertebra and an MRI scan subsequently showed lytic bone lesions involving the thoracic, lumbar, and sacral bones.  He was referred to Dr. Katragadda for follow-up who performed a biopsy of the T9 vertebral lesion on 12/26/2023 but unfortunately no tissue diagnosis could be made from that biopsy.  His family also tells me that imaging studies have shown that he has had a stroke though he never had symptoms of a stroke.  A multiple myeloma panel done on 12/07/2023 showed elevated alpha-2 globulin at 1.3 and an M spike was not observed.  Urine protein electrophoresis on 12/07/2023 was normal.  Tumor markers including AFP, CA 19-9 and CEA were not remarkable and his PSA was most recently 3.89.  His kappa and lambda free light chains were elevated but the kappa/lambda ratio was normal on 12/18/2023.  Dr. Cheree Cords is planning to do a bone marrow biopsy next week.  Since his most recent discharge from the hospital on 01/03/2024, the patient has not cleared his symptoms.  He continues to complain of cough productive of thick sputum but without color, fever and chills, and general malaise.  Family also reports some intermittent  confusion.   ED Course:  [name] vital signs were: Blood pressure (!) 158/60, pulse 84, temperature 98.4 F (36.9 C), temperature source Oral, resp. rate 17, height 5\' 7"  (1.702 m), weight 68 kg, SpO2 92%.  Chest x-ray showed increased patchy opacities at the left mid to upper lung zone; similar streaky opacities in the right upper lung and slightly increased streaky right basilar opacities concerning for multifocal pneumonia versus aspiration/edema.  Respiratory panel was negative for COVID and influenza.  MRSA nasal screening was negative.  Lactic acid was normal.  Chemistries were notable for sodium of 131, BUN 32, creatinine 2.37 (consistent with his normal baseline value) corresponding to a GFR of 28.  WBC was notable for an elevated level of 34.4 (was 13.110 days prior) with a shift to the left. [name] was given 1.5 L of lactated Ringer 's and he was started on meropenem and vancomycin  to cover hospital-acquired pathogens.  Assessment and plan 1-multifocal pneumonia with concern for aspiration -Patient has been seen by speech therapy with recommendation for regular diet and thin liquids; planning for MBS later today. - Currently not requiring oxygen supplementation - Patient did not meet criteria for sepsis - Continue narrowed antibiotics (cefepime  and Flagyl ) - Continue as needed bronchodilators - Continue mucolytic's therapy - Will continue to follow culture results and clinical response. - Continue supportive care and follow clinical response  2-peripheral vascular disease - Aspirin  and Plavix  on hold pending bone marrow biopsy scheduled for 01/15/2024.  3-essential hypertension - Continue Norvasc , losartan  and metoprolol . - Vital signs stable -  Heart healthy/low-sodium diet discussed with patient.  4-peripheral neuropathy - Continue treatment with Neurontin   5-uncontrolled type 2 diabetes - Recent A1c 7.6 - Continue sliding scale insulin  and follow CBG  fluctuation.  6-coronary artery disease - No chest pain - Aspirin  and Plavix  remain on hold in anticipation for bone marrow biopsy as mentioned above - Planning to resume once clear by IR service.  7-suspected metastatic cancer to the bone - Bone marrow biopsy is scheduled for later today 01/15/2024 - Questionable multiple myeloma - Patient actively following by oncology service. - As needed Tylenol  and oxycodone  will be added to help with pain management.  8-chronic kidney disease stage IV - Appears to be stable and at baseline currently - Continue to follow renal function trend - Maintain adequate hydration - Minimize nephrotoxic agents and the use of contrast.   Subjective:  No fever, no chest pain, reporting mild discomfort after bone marrow biopsy in his lumbar spine.  Patient is not requiring oxygen supplementation and is overall feeling better.  Physical Exam: Vitals:   01/15/24 2038 01/15/24 2229 01/16/24 0336 01/16/24 1328  BP: (!) 137/54 (!) 137/54 (!) 143/54 (!) 140/55  Pulse: 77 77 61 (!) 58  Resp: 18  17 18   Temp: 98.7 F (37.1 C)  98.3 F (36.8 C) 97.9 F (36.6 C)  TempSrc: Oral   Oral  SpO2: 95%  94% 97%  Weight:      Height:       General exam: Alert, awake, oriented x 3; in no acute distress. Respiratory system: Positive rhonchi appreciated bilaterally.  No using accessory muscle. Cardiovascular system:RRR. No rubs or gallops.  No JVD. Gastrointestinal system: Abdomen is nondistended, soft and nontender. No organomegaly or masses felt. Normal bowel sounds heard. Central nervous system: Alert and oriented. No focal neurological deficits. Extremities: No cyanosis or clubbing. Skin: No petechiae. Psychiatry: Judgement and insight appear normal. Mood & affect appropriate.   Latest data Reviewed: Basic metabolic panel: Sodium 134, potassium 4.1, chloride 102, bicarb 23, BUN 30, creatinine 2.22 and GFR 30 CBC: White blood cells 27.1, hemoglobin 7.8 and  platelet count 321K  Family Communication: No family at bedside on today's examination  Disposition: Status is: Inpatient Remains inpatient appropriate because: No IV antibiotics.  Anticipate discharge back home once medically stable. Time spent: 50 minutes  Author: Justina Oman, MD 01/16/2024 5:15 PM  For on call review [email or web address].

## 2024-01-16 NOTE — Plan of Care (Signed)

## 2024-01-16 NOTE — Plan of Care (Signed)
  Problem: Health Behavior/Discharge Planning: Goal: Ability to manage health-related needs will improve Outcome: Progressing   Problem: Clinical Measurements: Goal: Ability to maintain clinical measurements within normal limits will improve Outcome: Progressing Goal: Will remain free from infection Outcome: Progressing   Problem: Activity: Goal: Risk for activity intolerance will decrease Outcome: Progressing   Problem: Pain Managment: Goal: General experience of comfort will improve and/or be controlled Outcome: Progressing   Problem: Safety: Goal: Ability to remain free from injury will improve Outcome: Progressing   Problem: Skin Integrity: Goal: Risk for impaired skin integrity will decrease Outcome: Progressing   Problem: Activity: Goal: Ability to tolerate increased activity will improve Outcome: Progressing   Problem: Clinical Measurements: Goal: Ability to maintain a body temperature in the normal range will improve Outcome: Progressing

## 2024-01-16 NOTE — Progress Notes (Signed)
 Mobility Specialist Progress Note:    01/16/24 0928  Mobility  Activity Ambulated with assistance in hallway  Level of Assistance Standby assist, set-up cues, supervision of patient - no hands on  Assistive Device None  Distance Ambulated (ft) 150 ft  Range of Motion/Exercises Active;All extremities  Activity Response Tolerated well  Mobility Referral Yes  Mobility visit 1 Mobility  Mobility Specialist Start Time (ACUTE ONLY) 0910  Mobility Specialist Stop Time (ACUTE ONLY) [id number]  Mobility Specialist Time Calculation (min) (ACUTE ONLY) 18 min   Pt received in chair, daughter in room. Agreeable to mobility, required SBA to stand and ambulate with no AD. Tolerated well, pt feels good and energetic this morning. Returned to room, left pt in chair. All needs met.   [name] Mobility Specialist Please contact via Special educational needs teacher or  Rehab office at [phone]

## 2024-01-16 NOTE — Inpatient Diabetes Management (Signed)
 Inpatient Diabetes Program Recommendations  AACE/ADA: New Consensus Statement on Inpatient Glycemic Control (2015)  Target Ranges:  Prepandial:   less than 140 mg/dL      Peak postprandial:   less than 180 mg/dL (1-2 hours)      Critically ill patients:  140 - 180 mg/dL   Lab Results  Component Value Date   GLUCAP 149 (H) 01/16/2024   HGBA1C 7.6 (H) 12/11/2023    Review of Glycemic Control  Latest Reference Range & Units 01/15/24 07:12 01/15/24 12:59 01/15/24 16:12 01/15/24 20:40 01/15/24 23:55 01/16/24 03:35 01/16/24 08:11  Glucose-Capillary 70 - 99 mg/dL 161 (H) 096 (H) 045 (H) 349 (H) 231 (H) 111 (H) 149 (H)   Diabetes history: DM 2 Outpatient Diabetes medications: Lantus  20 units Current orders for Inpatient glycemic control:  Novolog  0-9 units Q4 hours A1c 7.6% on 4/29  Inpatient Diabetes Program Recommendations:    Note: unsure why pt spikes so high in the evening and overnight unless his largest meals are in the evenings.  -   could consider meal coverage Novolog  2 units bid before lunch and dinner if pt eating >50% of meals and glucose is at least 80 mg/dl.  Thanks,  Eloise Hake RN, MSN, BC-ADM Inpatient Diabetes Coordinator Team Pager [phone] (8a-5p)

## 2024-01-17 DIAGNOSIS — E1165 Type 2 diabetes mellitus with hyperglycemia: Secondary | ICD-10-CM | POA: Diagnosis not present

## 2024-01-17 DIAGNOSIS — C7951 Secondary malignant neoplasm of bone: Secondary | ICD-10-CM | POA: Diagnosis not present

## 2024-01-17 DIAGNOSIS — G603 Idiopathic progressive neuropathy: Secondary | ICD-10-CM | POA: Diagnosis not present

## 2024-01-17 DIAGNOSIS — J189 Pneumonia, unspecified organism: Secondary | ICD-10-CM | POA: Diagnosis not present

## 2024-01-17 LAB — CBC
HCT: 25.6 % — ABNORMAL LOW (ref 39.0–52.0)
Hemoglobin: 8.1 g/dL — ABNORMAL LOW (ref 13.0–17.0)
MCH: 26.6 pg (ref 26.0–34.0)
MCHC: 31.6 g/dL (ref 30.0–36.0)
MCV: 83.9 fL (ref 80.0–100.0)
Platelets: 316 10*3/uL (ref 150–400)
RBC: 3.05 MIL/uL — ABNORMAL LOW (ref 4.22–5.81)
RDW: 17.6 % — ABNORMAL HIGH (ref 11.5–15.5)
WBC: 9.5 10*3/uL (ref 4.0–10.5)
nRBC: 0 % (ref 0.0–0.2)

## 2024-01-17 LAB — GLUCOSE, CAPILLARY
Glucose-Capillary: 153 mg/dL — ABNORMAL HIGH (ref 70–99)
Glucose-Capillary: 112 mg/dL — ABNORMAL HIGH (ref 70–99)

## 2024-01-17 LAB — BASIC METABOLIC PANEL WITH GFR
Anion gap: 10 (ref 5–15)
BUN: 30 mg/dL — ABNORMAL HIGH (ref 8–23)
CO2: 22 mmol/L (ref 22–32)
Calcium: 8.2 mg/dL — ABNORMAL LOW (ref 8.9–10.3)
Chloride: 102 mmol/L (ref 98–111)
Creatinine, Ser: 2.08 mg/dL — ABNORMAL HIGH (ref 0.61–1.24)
GFR, Estimated: 32 mL/min — ABNORMAL LOW (ref >60)
Glucose, Bld: 132 mg/dL — ABNORMAL HIGH (ref 70–99)
Potassium: 4.8 mmol/L (ref 3.5–5.1)
Sodium: 134 mmol/L — ABNORMAL LOW (ref 135–145)

## 2024-01-17 MED ORDER — OXYCODONE HCL 5 MG PO TABS: 5.0000 mg | ORAL_TABLET | Freq: Four times a day (QID) | ORAL | 0 refills | Status: AC | PRN

## 2024-01-17 MED ORDER — METRONIDAZOLE 500 MG PO TABS: 500.0000 mg | ORAL_TABLET | Freq: Two times a day (BID) | ORAL | 0 refills | Status: AC

## 2024-01-17 MED ORDER — CEFDINIR 300 MG PO CAPS: 300.0000 mg | ORAL_CAPSULE | Freq: Two times a day (BID) | ORAL | 0 refills | Status: AC

## 2024-01-17 MED ORDER — DM-GUAIFENESIN ER 30-600 MG PO TB12: 1.0000 | ORAL_TABLET | Freq: Two times a day (BID) | ORAL | 0 refills | Status: DC

## 2024-01-17 NOTE — Plan of Care (Signed)

## 2024-01-17 NOTE — Plan of Care (Signed)
  Problem: Clinical Measurements: Goal: Ability to maintain clinical measurements within normal limits will improve Outcome: Progressing Goal: Will remain free from infection Outcome: Progressing Goal: Diagnostic test results will improve Outcome: Progressing Goal: Respiratory complications will improve Outcome: Progressing Goal: Cardiovascular complication will be avoided Outcome: Progressing   Problem: Clinical Measurements: Goal: Ability to maintain a body temperature in the normal range will improve Outcome: Progressing   Problem: Respiratory: Goal: Ability to maintain adequate ventilation will improve Outcome: Progressing Goal: Ability to maintain a clear airway will improve Outcome: Progressing   Problem: Education: Goal: Ability to describe self-care measures that may prevent or decrease complications (Diabetes Survival Skills Education) will improve Outcome: Progressing Goal: Individualized Educational Video(s) Outcome: Progressing   Problem: Fluid Volume: Goal: Ability to maintain a balanced intake and output will improve Outcome: Progressing

## 2024-01-17 NOTE — Discharge Summary (Signed)
 Physician Discharge Summary   Patient: [name] MRN: [id number] DOB: Mar 30, 1946  Admit date:     01/13/2024  Discharge date: 01/17/24  Discharge Physician: Justina Oman   PCP: [name]   Recommendations at discharge:  Repeat x-rays in 6-8 weeks to assure resolution of infiltrates Repeat basic metabolic panel to follow electrolytes and renal function stability. Reassess blood pressure and adjust antihypertensive as needed Repeat CBC to follow hemoglobin trend/stability.  Discharge Diagnoses: Principal Problem:   Multifocal pneumonia Active Problems:   PVD (peripheral vascular disease) (HCC)   Essential hypertension   Neuropathy, peripheral   HLD (hyperlipidemia)   HCAP (healthcare-associated pneumonia)   Pneumonia of left upper lobe due to infectious organism   Uncontrolled type 2 diabetes mellitus with hyperglycemia, with long-term current use of insulin  (HCC)   Hypertension   Coronary artery disease involving coronary bypass graft of native heart without angina pectoris   Metastatic cancer to bone (HCC)   Chronic kidney disease (CKD), stage IV (severe) (HCC)  Brief hospital admission narrative: As per H&P written by Dr. Odella Bending on 01/13/2024 LABARON Dunn is an 78 y.o. male with a history of coronary artery disease status post CABG, hypertension, diabetes mellitus type 2, hyperlipidemia, CKD stage IV, diverticulitis status post colostomy and subsequent takedown, atrial tachycardia, peripheral vascular disease, TIA who was recently in the hospital from 12/28/2023 - 01/03/2024 for treatment of pneumonia.  Prior to that, he was also in the hospital 12/07/2023 - 12/10/2023 for pneumonia and was discharged on levofloxacin .  During that admission, a CT scan of the chest done on 12/10/2023 showed a pathological fracture of the T9 vertebra and an MRI scan subsequently showed lytic bone lesions involving the thoracic, lumbar, and sacral bones.  He was referred to Dr. Katragadda for  follow-up who performed a biopsy of the T9 vertebral lesion on 12/26/2023 but unfortunately no tissue diagnosis could be made from that biopsy.  His family also tells me that imaging studies have shown that he has had a stroke though he never had symptoms of a stroke.  A multiple myeloma panel done on 12/07/2023 showed elevated alpha-2 globulin at 1.3 and an M spike was not observed.  Urine protein electrophoresis on 12/07/2023 was normal.  Tumor markers including AFP, CA 19-9 and CEA were not remarkable and his PSA was most recently 3.89.  His kappa and lambda free light chains were elevated but the kappa/lambda ratio was normal on 12/18/2023.  Dr. Cheree Cords is planning to do a bone marrow biopsy next week.  Since his most recent discharge from the hospital on 01/03/2024, the patient has not cleared his symptoms.  He continues to complain of cough productive of thick sputum but without color, fever and chills, and general malaise.  Family also reports some intermittent confusion.   ED Course:  [name] vital signs were: Blood pressure (!) 158/60, pulse 84, temperature 98.4 F (36.9 C), temperature source Oral, resp. rate 17, height 5\' 7"  (1.702 m), weight 68 kg, SpO2 92%.  Chest x-ray showed increased patchy opacities at the left mid to upper lung zone; similar streaky opacities in the right upper lung and slightly increased streaky right basilar opacities concerning for multifocal pneumonia versus aspiration/edema.  Respiratory panel was negative for COVID and influenza.  MRSA nasal screening was negative.  Lactic acid was normal.  Chemistries were notable for sodium of 131, BUN 32, creatinine 2.37 (consistent with his normal baseline value) corresponding to a GFR of 28.  WBC was  notable for an elevated level of 34.4 (was 13.110 days prior) with a shift to the left. Mr. Deike was given 1.5 L of lactated Ringer 's and he was started on meropenem and vancomycin  to cover hospital-acquired pathogens.  Assessment and  Plan: 1-multifocal pneumonia with concern for aspiration -Patient has been seen by speech therapy with recommendation for regular diet and thin liquids; essentially normal MBS. - Currently not requiring oxygen supplementation and demonstrating good saturation. - Patient did not meet criteria for sepsis - Patient with excellent response to IV antibiotics and will transition to oral cefdinir  and Flagyl  to complete therapy orally at discharge. - Continue as needed bronchodilators - Continue mucolytic's therapy - Repeat chest x-ray in 6 to 8 weeks to assure resolution of infiltrates - Continue outpatient follow-up with PCP.   2-peripheral vascular disease - Continue aspirin  and Plavix .   3-essential hypertension - Continue Norvasc , losartan  and metoprolol . - Vital signs stable - Heart healthy/low-sodium diet discussed with patient.   4-peripheral neuropathy - Continue treatment with Neurontin    5-uncontrolled type 2 diabetes - Recent A1c 7.6 - Resume home hypoglycemic regimen.   6-coronary artery disease - No chest pain - Resume aspirin  and Plavix  - Continue outpatient follow-up with cardiology service.   7-suspected metastatic cancer to the bone - Status post bone marrow biopsy; results currently pending. - Questionable multiple myeloma - Patient actively following with oncology service. - Continue as needed Tylenol  and oxycodone  will be added to help with pain management.   8-chronic kidney disease stage IV - Appears to be stable and at baseline currently - Continue to follow renal function trend - Patient has been instructed to maintain adequate hydration - Minimize nephrotoxic agents and the use of contrast.  Consultants: IR, oncology service curbside Procedures performed: See below for x-ray report; lumbar vertebra bone marrow biopsy. Disposition: Home Diet recommendation: Heart healthy/low-sodium diet.  DISCHARGE MEDICATION: Allergies as of 01/17/2024        Reactions   Acarbose Diarrhea        Medication List     STOP taking these medications    benzonatate 200 MG capsule Commonly known as: TESSALON       TAKE these medications    albuterol  108 (90 Base) MCG/ACT inhaler Commonly known as: VENTOLIN  HFA Inhale 2 puffs into the lungs every 6 (six) hours as needed for wheezing or shortness of breath.   amLODipine  10 MG tablet Commonly known as: NORVASC  Take 10 mg by mouth in the morning.   aspirin  EC 81 MG tablet Take 1 tablet (81 mg total) by mouth daily with breakfast.   cefdinir  300 MG capsule Commonly known as: OMNICEF  Take 1 capsule (300 mg total) by mouth 2 (two) times daily for 5 days.   clopidogrel  75 MG tablet Commonly known as: PLAVIX  Take 75 mg by mouth in the morning.   cyanocobalamin  1000 MCG tablet Commonly known as: VITAMIN B12 Take 1,000 mcg by mouth in the morning.   dextromethorphan -guaiFENesin  30-600 MG 12hr tablet Commonly known as: MUCINEX  DM Take 1 tablet by mouth 2 (two) times daily.   gabapentin  300 MG capsule Commonly known as: Neurontin  Take 1 capsule (300 mg total) by mouth 2 (two) times daily.   insulin  glargine 100 UNIT/ML injection Commonly known as: LANTUS  Inject 0.1 mLs (10 Units total) into the skin at bedtime. What changed: how much to take   losartan  50 MG tablet Commonly known as: COZAAR  Take 50 mg by mouth daily.   metoprolol  tartrate 50 MG  tablet Commonly known as: LOPRESSOR  Take 25 mg by mouth 2 (two) times daily.   metroNIDAZOLE  500 MG tablet Commonly known as: FLAGYL  Take 1 tablet (500 mg total) by mouth 2 (two) times daily for 5 days.   oxyCODONE  5 MG immediate release tablet Commonly known as: Oxy IR/ROXICODONE  Take 1 tablet (5 mg total) by mouth every 6 (six) hours as needed for up to 5 days for severe pain (pain score 7-10).   pantoprazole  40 MG tablet Commonly known as: PROTONIX  Take 1 tablet (40 mg total) by mouth 2 (two) times daily.   Trelegy Ellipta  200-62.5-25 MCG/ACT Aepb Generic drug: Fluticasone-Umeclidin-Vilant Inhale 2 puffs into the lungs daily.        Follow-up Information     [facility] Schedule an appointment as soon as possible for a visit in 2 week(s).   Contact information: [address] [facility] [place] [address] [phone]                Discharge Exam: Filed Weights   01/13/24 1500 01/13/24 1945  Weight: 68 kg 63.8 kg   General exam: Alert, awake, oriented x 3; in no acute distress. Respiratory system: Positive rhonchi appreciated bilaterally.  No using accessory muscle. Cardiovascular system:RRR. No rubs or gallops.  No JVD. Gastrointestinal system: Abdomen is nondistended, soft and nontender. No organomegaly or masses felt. Normal bowel sounds heard. Central nervous system: Alert and oriented. No focal neurological deficits. Extremities: No cyanosis or clubbing. Skin: No petechiae. Psychiatry: Judgement and insight appear normal. Mood & affect appropriate.   Condition at discharge: Stable and improved.  The results of significant diagnostics from this hospitalization (including imaging, microbiology, ancillary and laboratory) are listed below for reference.   Imaging Studies: DG Swallowing Func-Speech Pathology Result Date: 01/16/2024 Table formatting from the original result was not included. Modified Barium Swallow Study Patient Details Name: ESTIVEN KOHAN MRN: [id number] Date of Birth: 05-04-1946 Today's Date: 01/16/2024 HPI/PMH: HPI: Pt is a 78 y.o. male with PMHx of PNA (2019, April 2025, and May 2025), Bell's Palsy (2019), previous esophogram 10/16/2017 was [facility] except for prominent CP bar. Daughter reports he was recently diagnosed with COPD (unable to verify in chart). Other medical hx includes coronary artery disease status post CABG, hypertension, diabetes mellitus type 2, hyperlipidemia, CKD stage IV, diverticulitis status post colostomy and subsequent takedown,  atrial tachycardia, peripheral vascular disease, TIA. Pt recently in the hospital from 12/07/2023 - 12/10/2023 as well as 12/28/2023 - 01/03/2024 for treatment of PNA. Chest x-ray 01/13/24: "Increased patchy opacities at the left mid to upper lung zone.  Similar streaky opacities in the right upper lung and slightly increased streaky right basilar opacities. These findings could reflect multifocal pneumonia, aspiration, or edema." Clinical Impression: Pt presents with oropharyngeal function that is [facility] with no observed aspiration across consistencies. Oral phase was timely and unremarkable. Flash penetration into laryngeal vestibule during the swallow observed with thin liquids during consecutive sips administered via cup and straw. Slightly reduced base of tongue retraction leading to trace BOT residue. Slightly diminished pharyngeal stripping wave resulting in trace pharyngeal residue in the valleculae, which cleared with subsequent reflexive swallow. Hyolaryngeal excusion was intact. Esophageal sweep was limited but unremarkable. Recommend continue regular textures with thin liquids with safe swallow precautions of small bites/sips, slow rate, alternate solids and liquids, upright for all PO and for at least 30 minute after PO. Factors that may increase risk of adverse event in presence of aspiration Roderick Civatte & Jessy Morocco 2021): Factors  that may increase risk of adverse event in presence of aspiration Roderick Civatte & Jessy Morocco 2021): Poor general health and/or compromised immunity; Respiratory or GI disease Recommendations/Plan: Swallowing Evaluation Recommendations Swallowing Evaluation Recommendations Recommendations: PO diet Liquid Administration via: Cup; Straw Medication Administration: Whole meds with liquid Supervision: Patient able to self-feed Swallowing strategies  : Slow rate; Small bites/sips; Follow solids with liquids Postural changes: Position pt fully upright for meals; Stay upright 30-60 min after meals Oral care  recommendations: Oral care BID (2x/day) Treatment Plan Treatment Plan Treatment recommendations: No treatment recommended at this time Follow-up recommendations: No SLP follow up Functional status assessment: Patient has had a recent decline in their functional status and demonstrates the ability to make significant improvements in function in a reasonable and predictable amount of time. Recommendations Recommendations for follow up therapy are one component of a multi-disciplinary discharge planning process, led by the attending physician.  Recommendations may be updated based on patient status, additional functional criteria and insurance authorization. Assessment: Orofacial Exam: Orofacial Exam Oral Cavity: Oral Hygiene: WFL Oral Cavity - Dentition: Missing dentition (partials on top (not present for study)) Orofacial Anatomy: WFL Oral Motor/Sensory Function: WFL Anatomy: Anatomy: Other (Comment) (hx of CP bar) Boluses Administered: Boluses Administered Boluses Administered: Thin liquids (Level 0); Mildly thick liquids (Level 2, nectar thick); Moderately thick liquids (Level 3, honey thick); Puree; Solid (Barium pill)  Oral Impairment Domain: Oral Impairment Domain Lip Closure: No labial escape Tongue control during bolus hold: Cohesive bolus between tongue to palatal seal Bolus preparation/mastication: Timely and efficient chewing and mashing Bolus transport/lingual motion: Brisk tongue motion Oral residue: Trace residue lining oral structures Location of oral residue : Tongue (base of tongue) Initiation of pharyngeal swallow : Pyriform sinuses; Posterior angle of the ramus Pharyngeal Impairment Domain: Pharyngeal Impairment Domain Soft palate elevation: No bolus between soft palate (SP)/pharyngeal wall (PW) Laryngeal elevation: Complete superior movement of thyroid  cartilage with complete approximation of arytenoids to epiglottic petiole Anterior hyoid excursion: Partial anterior movement Epiglottic movement:  Complete inversion Laryngeal vestibule closure: Incomplete, narrow column air/contrast in laryngeal vestibule Pharyngeal stripping wave : Present - diminished Pharyngeal contraction (A/P view only): N/A Pharyngoesophageal segment opening: Complete distension and complete duration, no obstruction of flow Tongue base retraction: Trace column of contrast or air between tongue base and PPW Pharyngeal residue: Trace residue within or on pharyngeal structures Location of pharyngeal residue: Valleculae; Tongue base  Esophageal Impairment Domain: Esophageal Impairment Domain Esophageal clearance upright position: Complete clearance, esophageal coating (Limited view with sweep) Pill: Pill Consistency administered: Thin liquids (Level 0) Penetration/Aspiration Scale Score: Penetration/Aspiration Scale Score 1.  Material does not enter airway: Pill; Solid; Puree; Moderately thick liquids (Level 3, honey thick); Mildly thick liquids (Level 2, nectar thick); Thin liquids (Level 0) 2.  Material enters airway, remains ABOVE vocal cords then ejected out: Thin liquids (Level 0) Compensatory Strategies: No data recorded  General Information: Caregiver present: No  Diet Prior to this Study: Regular; Thin liquids (Level 0)   Temperature : Normal   Respiratory Status: WFL   Supplemental O2: None (Room air)   History of Recent Intubation: No  Behavior/Cognition: Alert; Cooperative; Pleasant mood Self-Feeding Abilities: Able to self-feed Baseline vocal quality/speech: Normal Volitional Cough: Able to elicit Volitional Swallow: Able to elicit No data recorded Pain: Pain Assessment Pain Assessment: No/denies pain End of Session: Start Time:SLP Start Time (ACUTE ONLY): 1425 Stop Time: SLP Stop Time (ACUTE ONLY): 1505 Time Calculation:SLP Time Calculation (min) (ACUTE ONLY): 40 min Charges: SLP Evaluations $ SLP  Speech Visit: 1 Visit SLP Evaluations $MBS Swallow: 1 Procedure SLP visit diagnosis: SLP Visit Diagnosis: Dysphagia, unspecified  (R13.10) Past Medical History: Past Medical History: Diagnosis Date  Anemia   CAD S/P percutaneous coronary angioplasty 1998  PCI TO CX  CKD (chronic kidney disease) stage 3, GFR 30-59 ml/min (HCC)   Diabetes mellitus without complication (HCC)   Diverticulitis 08/16/2017  hospitalized with diverticulitis/sepsis  GERD (gastroesophageal reflux disease)   occassionally  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  Hypercholesteremia   Hypertension   Neuropathy   Peripheral vascular disease (HCC)   s/p R-L FEM-FEM BYPASS  Pneumonia   Tachycardia   after CABG, pt on medicine for this Past Surgical History: Past Surgical History: Procedure Laterality Date  ABDOMINAL AORTOGRAM Bilateral 09/19/2016  Procedure: iliac;  Surgeon: Margherita Shell, MD;  [facility]: [facility];  Service: Cardiovascular;  Laterality: Bilateral;  ABDOMINAL AORTOGRAM W/LOWER EXTREMITY N/A 09/22/2021  Procedure: ABDOMINAL AORTOGRAM W/LOWER EXTREMITY;  Surgeon: Young Hensen, MD;  Location: [facility];  Service: Cardiovascular;  Laterality: N/A;  ABDOMINAL AORTOGRAM W/LOWER EXTREMITY N/A 10/05/2022  Procedure: ABDOMINAL AORTOGRAM W/LOWER EXTREMITY;  Surgeon: Young Hensen, MD;  [facility]: [facility];  Service: Cardiovascular;  Laterality: N/A;  BIOPSY  05/16/2023  Procedure: BIOPSY;  Surgeon: Suzette Espy, MD;  [facility];  Service: Endoscopy;;  CARDIAC CATHETERIZATION  2003  with stent  CARDIAC CATHETERIZATION N/A 10/11/2015  Procedure: Left Heart Cath and Coronary Angiography;  Surgeon: Peter M Swaziland, MD;  Location: [facility] INVASIVE CV LAB;  Service: Cardiovascular;  Laterality: N/A;  COLON RESECTION N/A 12/13/2017  Procedure: EXPLORATORY LAPAROTOMY, SIGMOID COLECTOMY WITH COLOSTOMY;  Surgeon: Dareen Ebbing, MD;  Location: [facility];  Service: General;  Laterality: N/A;  COLONOSCOPY N/A 09/22/2013  Procedure: COLONOSCOPY;  Surgeon: Suzette Espy, MD;  [facility];  Service: Endoscopy;  Laterality: N/A;  9:30 AM   COLONOSCOPY N/A 02/19/2018  Procedure: COLONOSCOPY;  Surgeon: Suzette Espy, MD;  [facility];  Service: Endoscopy;  Laterality: N/A;  2:00pm  COLONOSCOPY WITH PROPOFOL  N/A 05/16/2023  Procedure: COLONOSCOPY WITH PROPOFOL ;  Surgeon: Suzette Espy, MD;  [facility];  Service: Endoscopy;  Laterality: N/A;  730am, asa 3  COLOSTOMY N/A 12/13/2017  Procedure: COLOSTOMY;  Surgeon: Dareen Ebbing, MD;  [facility] OR;  Service: General;  Laterality: N/A;  COLOSTOMY REVERSAL N/A 03/26/2018  Procedure: COLOSTOMY REVERSAL;  Surgeon: Dareen Ebbing, MD;  Location: [facility];  Service: General;  Laterality: N/A;  CORONARY ARTERY BYPASS GRAFT N/A 10/18/2015  Procedure: CORONARY ARTERY BYPASS GRAFTING (CABG) x  five, using left internal mammary artery and right leg greater saphenous vein harvested endoscopically;  Surgeon: Zelphia Higashi, MD;  Location: [facility] OR;  Service: Open Heart Surgery;  Laterality: N/A;  ENDARTERECTOMY FEMORAL Right 08/09/2016  Procedure: ENDARTERECTOMY FEMORAL WITH VEIN PATCH ANGIOPLASTY;  Surgeon: Margherita Shell, MD;  Location: [facility];  Service: Vascular;  Laterality: Right;  ESOPHAGOGASTRODUODENOSCOPY N/A 10/24/2017  Dr. Riley Cheadle: hiatal hernia  ESOPHAGOGASTRODUODENOSCOPY N/A 01/01/2024  Procedure: EGD (ESOPHAGOGASTRODUODENOSCOPY);  Surgeon: Hargis Lias, MD;  [facility];  Service: Endoscopy;  Laterality: N/A;  ESOPHAGOGASTRODUODENOSCOPY (EGD) WITH PROPOFOL  N/A 05/16/2023  Procedure: ESOPHAGOGASTRODUODENOSCOPY (EGD) WITH PROPOFOL ;  Surgeon: Suzette Espy, MD;  [facility];  Service: Endoscopy;  Laterality: N/A;  FEMORAL-FEMORAL BYPASS GRAFT Bilateral 08/09/2016  Procedure: REVISION BYPASS GRAFT RIGHT FEMORAL-LEFT FEMORAL ARTERY;  Surgeon: Margherita Shell, MD;  Location: [facility]  OR;  Service: Vascular;  Laterality: Bilateral;  FEMORAL-POPLITEAL BYPASS GRAFT    INTRAMEDULLARY (IM) NAIL INTERTROCHANTERIC Left 04/03/2021  Procedure: LEFT  HIP INTRAMEDULLARY  (IM) NAIL INTERTROCHANTRIC;  Surgeon: Arnie Lao, MD;  Location: [facility];  Service: Orthopedics;  Laterality: Left;  IR FLUORO GUIDED NEEDLE PLC ASPIRATION/INJECTION LOC  12/26/2023  MALONEY DILATION N/A 05/16/2023  Procedure: MALONEY DILATION;  Surgeon: Suzette Espy, MD;  [facility];  Service: Endoscopy;  Laterality: N/A;  PERIPHERAL VASCULAR BALLOON ANGIOPLASTY Right 09/22/2021  Procedure: PERIPHERAL VASCULAR BALLOON ANGIOPLASTY;  Surgeon: Young Hensen, MD;  [facility];  Service: Cardiovascular;  Laterality: Right;  PERIPHERAL VASCULAR CATHETERIZATION N/A 05/08/2016  Procedure: Lower Extremity Angiography;  Surgeon: Avanell Leigh, MD;  Location: [facility]VASIVE CV LAB;  Service: Cardiovascular;  Laterality: N/A;  PERIPHERAL VASCULAR INTERVENTION Right 09/19/2016  Procedure: Peripheral Vascular Intervention;  Surgeon: Margherita Shell, MD;  [facility];  Service: Cardiovascular;  Laterality: Right;  ext iliac stent  PERIPHERAL VASCULAR INTERVENTION Right 09/22/2021  Procedure: PERIPHERAL VASCULAR INTERVENTION;  Surgeon: Young Hensen, MD;  [facility];  Service: Cardiovascular;  Laterality: Right;  POLYPECTOMY  02/19/2018  Procedure: POLYPECTOMY;  Surgeon: Suzette Espy, MD;  [facility];  Service: Endoscopy;;  PROCTOSCOPY  10/24/2017  Procedure: PROCTOSCOPY;  Surgeon: Suzette Espy, MD;  [facility];  Service: Endoscopy;;  TEE WITHOUT CARDIOVERSION N/A 10/18/2015  Procedure: TRANSESOPHAGEAL ECHOCARDIOGRAM (TEE);  Surgeon: Zelphia Higashi, MD;  Location: [facility] OR;  Service: Open Heart Surgery;  Laterality: N/A; Caretha Chapel, MA CCC-SLP Speech-Language Pathologist 01/16/2024, 3:53 PM  DG Chest Port 1 View Result Date: 01/13/2024 CLINICAL DATA:  Fever.  Decreased lung sounds. EXAM: PORTABLE CHEST 1 VIEW COMPARISON:  01/01/2024. FINDINGS: Stable cardiomediastinal silhouette. Prior median sternotomy  and CABG. Increased patchy opacities at the left mid to upper lung zone. Similar streaky opacities in the right upper lung and slightly increased streaky right basilar opacities. No large pleural effusion. No pneumothorax. Known bilateral rib fractures, better evaluated on the prior CT. IMPRESSION: Increased patchy opacities at the left mid to upper lung zone. Similar streaky opacities in the right upper lung and slightly increased streaky right basilar opacities. These findings could reflect multifocal pneumonia, aspiration, or edema. Electronically Signed   By: Mannie Seek M.D.   On: 01/13/2024 16:24   CT HEAD WO CONTRAST ( ) Result Date: 01/03/2024 CLINICAL DATA:  Head trauma, minor (Age >= 65y).  Fall EXAM: CT HEAD WITHOUT CONTRAST TECHNIQUE: Contiguous axial images were obtained from the base of the skull through the vertex without intravenous contrast. RADIATION DOSE REDUCTION: This exam was performed according to the departmental dose-optimization program which includes automated exposure control, adjustment of the mA and/or kV according to patient size and/or use of iterative reconstruction technique. COMPARISON:  12/10/2023 FINDINGS: Brain: Cavum septum pellucidum. Moderate global parenchymal volume loss is stable since prior examination. Mild periventricular white matter changes are present likely reflecting the sequela of small vessel ischemia. 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 No abnormal intra or extra-axial mass lesion or fluid collection. No abnormal mass effect or midline shift. No evidence of acute intracranial hemorrhage or infarct. Ventricular size is normal. Cerebellum unremarkable. Vascular: No asymmetric hyperdense vasculature at the skull base. Skull: Intact Sinuses/Orbits: Paranasal sinuses are clear. Orbits are unremarkable. Other: Dense fluid opacification of the right mastoid air cells and middle ear cavity. No associated osseous erosion  or  coalescence. Left mastoid air cells are clear. Small fluid noted within the left middle ear cavity. IMPRESSION: 1. No acute intracranial abnormality. No calvarial fracture. 2. Stable moderate global parenchymal volume loss and remote right frontal periventricular white matter infarct. 3. Dense fluid opacification of the right mastoid air cells and middle ear cavity. No associated osseous erosion or coalescence. Small fluid noted within the left middle ear cavity. Electronically Signed   By: Worthy Heads M.D.   On: 01/03/2024 01:16   NM Pulmonary Perfusion Result Date: 01/02/2024 CLINICAL DATA:  Hypoxia, tachycardia, metastatic cancer EXAM: NUCLEAR MEDICINE PERFUSION LUNG SCAN TECHNIQUE: Perfusion images were obtained in multiple projections after intravenous injection of radiopharmaceutical. Ventilation scans intentionally deferred if perfusion scan and chest x-ray adequate for interpretation during COVID 19 epidemic. RADIOPHARMACEUTICALS:  4.4 mCi Tc-17m MAA IV COMPARISON:  Chest radiograph 01/01/2024 FINDINGS: Perfusion imaging demonstrates no segmental or subsegmental perfusion abnormalities. IMPRESSION: No segmental or subsegmental perfusion abnormalities to suggest pulmonary embolism. Electronically Signed   By: Rozell Cornet M.D.   On: 01/02/2024 14:29   DG CHEST PORT 1 VIEW Result Date: 01/01/2024 CLINICAL DATA:  Hypoxia EXAM: PORTABLE CHEST 1 VIEW COMPARISON:  Chest radiograph dated 12/28/2023 FINDINGS: Normal lung volumes. Patchy right upper and left basilar opacities. No pleural effusion or pneumothorax. Similar mildly enlarged cardiomediastinal silhouette. Median sternotomy wires are nondisplaced. Bilateral rib fractures, better evaluated on prior CT. IMPRESSION: Patchy right upper and left basilar opacities, which may represent atelectasis, aspiration, or pneumonia. Electronically Signed   By: Limin  Xu M.D.   On: 01/01/2024 20:27   NM PET Image Initial (PI) Skull Base To Thigh Result Date:  01/01/2024 CLINICAL DATA:  Bone lesions. EXAM: NUCLEAR MEDICINE PET SKULL BASE TO THIGH TECHNIQUE: 7.08 mCi F-18 FDG was injected intravenously. Full-ring PET imaging was performed from the skull base to thigh after the radiotracer. CT data was obtained and used for attenuation correction and anatomic localization. Fasting blood glucose: 187 mg/dl COMPARISON:  Remote PET-CT scan 2017 CTA chest 12/10/2023. Spine MRI 12/07/2023. Abdomen pelvis CT 04/13/2023. FINDINGS: Mediastinal blood pool activity: SUV max 2.6 Liver activity: SUV max 2.7 NECK: No specific abnormal radiotracer uptake seen in the neck including along lymph node change of the submandibular, posterior triangle or internal jugular regions. Near symmetric uptake of the visualized intracranial compartment. Incidental CT findings: the parotid glands show some fatty atrophy of the left compared to right. The submandibular glands are preserved. Thyroid  gland is slightly small. Diffuse vascular calcifications. There is some streak artifact related to the patient's dental hardware. Opacification of the right mastoid air cells is seen. Please correlate with symptoms. The paranasal sinuses included in the imaging field are preserved. CHEST: No specific abnormal radiotracer uptake seen above blood pool in the axillary regions, hilum or mediastinum. There is some patchy left upper lobe uptake corresponding to some parenchymal opacities with reticulonodular changes. Uptake is maximum SUV of 2.6. Favor infectious or inflammatory process. Please correlate clinical presentation. Incidental CT findings: Status post median sternotomy. Heart is slightly enlarged. No significant pericardial effusion. Thoracic aorta is normal course and caliber with diffuse vascular calcifications. Calcifications as well along the great vessels. Slightly patulous esophagus. Breathing motion. No pneumothorax or effusion. Minimal patchy opacities in the right lung. ABDOMEN/PELVIS: There is  physiologic distribution radiotracer along the parenchymal organs, bowel and renal collecting systems. No abnormal nodal uptake. Incidental CT findings: Stomach is underdistended but there is slight wall/fold thickening. Please correlate with symptoms. Small bowel is nondilated. Large  bowel is normal course and caliber. There is moderate colonic stool. Surgical changes along the mid sigmoid colon. Dependent stones in the nondilated gallbladder. Bilateral renal atrophy identified, right worse than left. Pole posterior right-sided renal cyst identified measuring 3.5 cm in diameter. This has Hounsfield units of close to 0. Preserved contour to the urinary bladder. Extensive vascular calcifications seen throughout the abdomen including the aorta and branch vessels. There is also a femoral to femoral artery bypass graft. Multiple vascular stents. Please correlate with clinical history. No obvious free air or free fluid. SKELETON: There are multiple scattered areas of mild asymmetric uptake along the osseous structures. Example in the right upper hemi sacrum has maximum SUV value of 2.4. Small sclerotic area in this location on image 133. Focus upper left hemi pelvis at the iliac bone has area of maximum SUV 2.3 and also area of sclerosis on image 122. There are scattered foci along other areas of the pelvis, numerous vertebral bodies as well as along ribs. Neither example includes L3 vertebral body with maximum SUV 4.3. There are areas uptake along some rib fractures including the posterolateral aspect of the right seventh rib and lateral aspect of the left sixth rib. Please see recent MRI of the spine. Incidental CT findings: Scattered degenerative changes. Compression at T9-10. IMPRESSION: Multifocal mild areas of abnormal uptake along the skeleton including several ribs, vertebral bodies and scattered along the pelvis. Some of these lesions are associated with areas of bony sclerosis. Bilateral rib fractures are seen.  Please correlate with clinical history. The right-sided rib fracture appears more subacute in the left more acute. No soft tissue areas of abnormal uptake including along lymph nodes are in the spleen. Slight uptake along patchy left lung opacities and towards the lingula. Please correlate for etiology including infectious or inflammatory process and recommend follow-up. Extensive vascular calcifications identified. Postop chest. 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 Multiple stents. Cardiomegaly. Gallstones. Slight wall thickening of the stomach. Please correlate with any symptoms. Bilateral renal atrophy. Electronically Signed   By: Adrianna Horde M.D.   On: 01/01/2024 10:13   CT Chest Wo Contrast Result Date: 12/28/2023 CLINICAL DATA:  Chest pain, nonspecific ongoing chest pain, post pna. * Tracking Code: BO * EXAM: CT CHEST WITHOUT CONTRAST TECHNIQUE: Multidetector CT imaging of the chest was performed following the standard protocol without IV contrast. RADIATION DOSE REDUCTION: This exam was performed according to the departmental dose-optimization program which includes automated exposure control, adjustment of the mA and/or kV according to patient size and/or use of iterative reconstruction technique. COMPARISON:  PET-CT scan from 12/27/2023 and CT angiography chest from 12/10/2023. FINDINGS: Cardiovascular: Normal cardiac size. No pericardial effusion. No aortic aneurysm. There are coronary artery calcifications, in keeping with coronary artery disease. There are also moderate peripheral atherosclerotic vascular calcifications of thoracic aorta and its major branches. 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 Mediastinum/Nodes: Visualized thyroid  gland appears grossly unremarkable. No solid / cystic mediastinal masses. The esophagus is nondistended precluding optimal assessment. There are few mildly prominent mediastinal lymph nodes, which do not meet the size criteria for lymphadenopathy and appear  grossly similar to the prior study, favoring benign etiology. No axillary lymphadenopathy by size criteria. Evaluation of bilateral hila is limited due to lack on intravenous contrast: however, no large hilar lymphadenopathy identified. Lungs/Pleura: The central tracheo-bronchial tree is patent. There are patchy, heterogeneous, irregular opacities predominantly involving the left upper lobe, which are new since the prior study. There are also new heterogeneous scattered opacities  in the left lung lower lobe. There is continued interval improvement in the opacities in the right upper lobe. Several areas of subsegmental atelectasis throughout bilateral lungs are essentially unchanged in the interim. No new dense consolidation, lung collapse or pleural effusion. Evaluation for lung nodule is limited due to background lung parenchymal changes. Upper Abdomen: Small volume dependent calcified gallstones noted without imaging signs of acute cholecystitis. Remaining visualized upper abdominal viscera within normal limits. Musculoskeletal: The visualized soft tissues of the chest wall are grossly unremarkable. Redemonstration of mixed sclerotic/lytic lesions in the T7 and T9 vertebral body, essentially similar to the prior study. There is pathological fracture with mild compression deformity of T9 vertebral body. There is also ill-defined lytic lesion in the posterolateral right fourth rib (series 2, image 54), unchanged. Subacute/healing pathological fracture of right seventh rib also appears essentially unchanged. There is acute minimally displaced fracture of the anteromedial left sixth rib (series 2, image 79, which was present on the prior exam as well. There are mild multilevel degenerative changes in the visualized spine. IMPRESSION: 1. There are new heterogeneous opacities in the left lung, as described above, which are nonspecific and may represent multifocal pneumonia. There is continued interval improvement in the  opacities in the right upper lobe. 2. Redemonstration of osseous metastases, essentially similar to the prior study, including pathological fracture of T9 vertebral body and right seventh rib. 3. There is minimally displaced fracture of the anteromedial left sixth rib, present since the prior study. No metastatic lesion seen at the fracture site. 4. Multiple other nonacute observations, as described above. Aortic Atherosclerosis (ICD10-I70.0). Electronically Signed   By: Beula Brunswick M.D.   On: 12/28/2023 08:42   DG Chest 2 View Result Date: 12/28/2023 CLINICAL DATA:  Shortness of breath. EXAM: CHEST - 2 VIEW COMPARISON:  12/10/2023 FINDINGS: Low lung volumes. Stable asymmetric elevation left hemidiaphragm. Streaky density at the left base is similar to prior compatible with atelectasis or scarring. Posterior displaced fracture noted in a mid right rib, likely ribs 7. There is some bony callus at the fracture site suggesting nonacute injury. No pneumothorax or substantial pleural effusion. Telemetry leads overlie the chest. IMPRESSION: 1. Low lung volumes with streaky density at the left base compatible with atelectasis or scarring. 2. Nonacute posterior displaced fracture in a mid right rib, likely rib 7. Electronically Signed   By: Donnal Fusi M.D.   On: 12/28/2023 07:27   IR Fluoro Guide Ndl Plmt / BX Result Date: 12/27/2023 INDICATION: Enhancing abnormality of the T9 vertebral body on recent MRI of the thoracic spine. EXAM: BONE BIOPSY AT T9 MEDICATIONS: None. ANESTHESIA/SEDATION: Moderate (conscious) sedation was employed during this procedure. A total of Versed  2 mg and Fentanyl  50 mcg was administered intravenously by the radiology nurse. Total intra-service moderate Sedation Time: 26 minutes. The patient's level of consciousness and vital signs were monitored continuously by radiology nursing throughout the procedure under my direct supervision. Fluoro time: 6 minutes 48 seconds.  272 mGy.  COMPLICATIONS: None immediate. PROCEDURE: Informed written consent was obtained from the patient after a thorough discussion of the procedural risks, benefits and alternatives. All questions were addressed. Maximal Sterile Barrier Technique was utilized including caps, mask, sterile gowns, sterile gloves, sterile drape, hand hygiene and skin antiseptic. A timeout was performed prior to the initiation of the procedure. With the patient prone on the fluoroscopic table, the thoracolumbar region was prepped and draped in the usual manner. The T9 vertebral body was identified on the AP and  lateral projection by counting from the sacral region. The skin overlying the right pedicle was then infiltrated with 0.25% bupivacaine , and extended into the underlying paraspinal musculature. Using biplane intermittent fluoroscopy, a 13 gauge Cook spinal needle was advanced into the posterior aspect of T9. Through this, 2 passes were made with a 16 gauge core biopsy needle. Two core samples were obtained and sent in formalin for pathologic evaluation. Hemostasis was achieved at the skin entry site. Patient was then returned to short-stay for recovery. IMPRESSION: Status post fluoroscopic guided core biopsy at T9 vertebral body as described above without event. Electronically Signed   By: Luellen Sages M.D.   On: 12/27/2023 09:02    Microbiology: Results for orders placed or performed during the hospital encounter of 01/13/24  Resp panel by RT-PCR (RSV, Flu A&B, Covid) Anterior Nasal Swab     Status: None   Collection Time: 01/13/24  3:16 PM   Specimen: Anterior Nasal Swab  Result Value Ref Range Status   SARS Coronavirus 2 by RT PCR NEGATIVE NEGATIVE Final    Comment: (NOTE) SARS-CoV-2 target nucleic acids are NOT DETECTED.  The SARS-CoV-2 RNA is generally detectable in upper respiratory specimens during the acute phase of infection. The lowest concentration of SARS-CoV-2 viral copies this assay can detect is 138  copies/mL. A negative result does not preclude SARS-Cov-2 infection and should not be used as the sole basis for treatment or other patient management decisions. A negative result may occur with  improper specimen collection/handling, submission of specimen other than nasopharyngeal swab, presence of viral mutation(s) within the areas targeted by this assay, and inadequate number of viral copies(<138 copies/mL). A negative result must be combined with clinical observations, patient history, and epidemiological information. The expected result is Negative.  Fact Sheet for Patients:  BloggerCourse.com  Fact Sheet for Healthcare Providers:  SeriousBroker.it  This test is no t yet approved or cleared by the [place]  FDA and  has been authorized for detection and/or diagnosis of SARS-CoV-2 by FDA under an Emergency Use Authorization (EUA). This EUA will remain  in effect (meaning this test can be used) for the duration of the COVID-19 declaration under Section 564(b)(1) of the Act, 21 U.S.C.section 360bbb-3(b)(1), unless the authorization is terminated  or revoked sooner.       Influenza A by PCR NEGATIVE NEGATIVE Final   Influenza B by PCR NEGATIVE NEGATIVE Final    Comment: (NOTE) The Xpert Xpress SARS-CoV-2/FLU/RSV plus assay is intended as an aid in the diagnosis of influenza from Nasopharyngeal swab specimens and should not be used as a sole basis for treatment. Nasal washings and aspirates are unacceptable for Xpert Xpress SARS-CoV-2/FLU/RSV testing.  Fact Sheet for Patients: BloggerCourse.com  Fact Sheet for Healthcare Providers: SeriousBroker.it  This test is not yet approved or cleared by the [place]  FDA and has been authorized for detection and/or diagnosis of SARS-CoV-2 by FDA under an Emergency Use Authorization (EUA). This EUA will remain in effect (meaning  this test can be used) for the duration of the COVID-19 declaration under Section 564(b)(1) of the Act, 21 U.S.C. section 360bbb-3(b)(1), unless the authorization is terminated or revoked.     Resp Syncytial Virus by PCR NEGATIVE NEGATIVE Final    Comment: (NOTE) Fact Sheet for Patients: BloggerCourse.com  Fact Sheet for Healthcare Providers: SeriousBroker.it  This test is not yet approved or cleared by the [place]  FDA and has been authorized for detection and/or diagnosis of SARS-CoV-2 by FDA under an Emergency Use  Authorization (EUA). This EUA will remain in effect (meaning this test can be used) for the duration of the COVID-19 declaration under Section 564(b)(1) of the Act, 21 U.S.C. section 360bbb-3(b)(1), unless the authorization is terminated or revoked.  Performed at [facility], [address], [place], [place] [address]   MRSA Next Gen by PCR, Nasal     Status: None   Collection Time: 01/13/24  3:16 PM   Specimen: Anterior Nasal Swab  Result Value Ref Range Status   MRSA by PCR Next Gen NOT DETECTED NOT DETECTED Final    Comment: (NOTE) The GeneXpert MRSA Assay (FDA approved for NASAL specimens only), is one component of a comprehensive MRSA colonization surveillance program. It is not intended to diagnose MRSA infection nor to guide or monitor treatment for MRSA infections. Test performance is not FDA approved in patients less than 45 years old. Performed at [facility], [address] [place], [place] [address]   Blood Culture (routine x 2)     Status: None (Preliminary result)   Collection Time: 01/13/24  3:51 PM   Specimen: BLOOD RIGHT FOREARM  Result Value Ref Range Status   Specimen Description   Final    BLOOD RIGHT FOREARM BOTTLES DRAWN AEROBIC AND ANAEROBIC   Special Requests Blood Culture adequate volume  Final   Culture   Final    NO GROWTH 4 DAYS Performed at [facility], [address]  [place], [place] [address]    Report Status PENDING  Incomplete  Blood Culture (routine x 2)     Status: None (Preliminary result)   Collection Time: 01/13/24  3:57 PM   Specimen: BLOOD LEFT HAND  Result Value Ref Range Status   Specimen Description   Final    BLOOD LEFT HAND BOTTLES DRAWN AEROBIC AND ANAEROBIC   Special Requests Blood Culture adequate volume  Final   Culture   Final    NO GROWTH 4 DAYS Performed at [facility], [address], [place], [place] [address]    Report Status PENDING  Incomplete    Labs: CBC: Recent Labs  Lab 01/13/24 1551 01/14/24 0242 01/17/24 0430  WBC 34.4* 27.1* 9.5  NEUTROABS 31.3*  --   --   HGB 8.2* 7.8* 8.1*  HCT 24.5* 25.4* 25.6*  MCV 82.2 84.4 83.9  PLT 356 321 316   Basic Metabolic Panel: Recent Labs  Lab 01/13/24 1551 01/14/24 0242 01/17/24 0430  NA 131* 134* 134*  K 4.2 4.1 4.8  CL 99 102 102  CO2 22 23 22   GLUCOSE 105* 156* 132*  BUN 32* 30* 30*  CREATININE 2.37* 2.22* 2.08*  CALCIUM  8.2* 8.2* 8.2*   Liver Function Tests: Recent Labs  Lab 01/13/24 1551  AST 12*  ALT 13  ALKPHOS 81  BILITOT 0.8  PROT 5.9*  ALBUMIN  2.8*   CBG: Recent Labs  Lab 01/16/24 1634 01/16/24 1951 01/16/24 2347 01/17/24 0328 01/17/24 0734  GLUCAP 133* 264* 170* 153* 112*    Discharge time spent: greater than 30 minutes.  Signed: Justina Oman, MD [facility] 01/17/2024

## 2024-01-17 NOTE — Progress Notes (Signed)
 Mobility Specialist Progress Note:    01/17/24 0920  Mobility  Activity Ambulated with assistance in hallway  Level of Assistance Standby assist, set-up cues, supervision of patient - no hands on  Assistive Device None  Distance Ambulated (ft) 200 ft  Range of Motion/Exercises Active;All extremities  Activity Response Tolerated well  Mobility Referral Yes  Mobility visit 1 Mobility  Mobility Specialist Start Time (ACUTE ONLY) 0900  Mobility Specialist Stop Time (ACUTE ONLY) 0920  Mobility Specialist Time Calculation (min) (ACUTE ONLY) 20 min   Pt received in bed, agreeable to mobility. Required SBA to stand and ambulate with no AD. Tolerated well, asx throughout. Left pt sitting EOB, all needs met.   [name] Mobility Specialist Please contact via Special educational needs teacher or  Rehab office at [phone]

## 2024-01-18 LAB — CULTURE, BLOOD (ROUTINE X 2)
Special Requests: ADEQUATE
Special Requests: ADEQUATE

## 2024-01-21 ENCOUNTER — Inpatient Hospital Stay: Admitting: Hematology

## 2024-01-21 NOTE — Progress Notes (Incomplete)
 [facility] [address], [place] [address]   Clinic Day:  01/21/2024  Referring physician: Clinic, [name]  Patient Care Team: Clinic, [name] as PCP - General [name] Jan, MD as PCP - Cardiology (Cardiology) [place], Peter M, MD as Consulting Physician (Cardiology) Anthony Leigh, MD as Consulting Physician (Cardiology) Center, [facility]   ASSESSMENT & PLAN:   Assessment:  1.  Metastatic (lytic) bone lesions: - Presentation: Right anterior chest wall pain since March 2025 - CT chest (12/07/2023): New fracture through the superior endplate of T9 vertebral body at the site of mixed sclerotic and lucent lesion consistent with pathologic fracture.  Patchy bilateral upper lobe nodular airspace disease infectious/inflammatory etiology. - MRI thoracic spine (12/07/2023): Pathologic compression fracture at T9-10 with abnormal enhancement extending into the expanded right pedicle.  Multiple enhancing lesions throughout the thoracic spine.  Multiple rib lesions bilaterally without pathologic rib fracture. - MRI lumbar spine (12/07/2023): Multiple enhancing lesions throughout the lumbar spine, sacrum and iliac bones.  No pathological fracture. - CT angiogram chest (12/09/2020): No pulmonary embolism.  Multifocal pulmonary infiltrates, slightly improved in the left upper lobe.  Multiple lytic lesions within the T7 and T9 vertebral bodies with pathologic fracture of the T9 vertebral body and right 4th and 7th ribs and pathologic fracture of the right seventh rib. - No B symptoms.  Had prior nonmelanoma skin cancer removed on the face. - 12/07/2023: PSA-3.89, SPEP: Dunn spike not observed, immunofixation unremarkable   2. Social/Family History: -Lives at home with wife and is on disability.  Gait is unsteady due to neuropathy in the feet from diabetes.  He is able to do ADLs and IADLs.  Quit tobacco use 20 years ago of 2 ppd for 40 years. Agent orange  exposure in [place]. No asbestos exposure. -Brother had esophageal cancer. No other family history of cancer.   Plan:  1.  Metastatic (lytic) bone lesions: - I have reviewed labs from recent hospitalization which showed PSA at 3.89.  SPEP and immunofixation were normal. - I have reviewed images of the scans with the patient and his wife and daughter. - Recommend testing for serum free light chains.  Will also check LDH and few other tumor markers. - Recommend PET CT scan to evaluate for primary. - Will request IR to see if they can biopsy T9 or any other bone lesions safely. - RTC after scan.   No orders of the defined types were placed in this encounter.     [name],acting as a Neurosurgeon for Anthony Boros, MD.,have documented all relevant documentation on the behalf of Anthony Boros, MD,as directed by  Anthony Boros, MD while in the presence of Anthony Boros, MD.  ***   [name]   6/9/20258:07 AM  CHIEF COMPLAINT/PURPOSE OF CONSULT:   Diagnosis: Lytic bone lesions   Cancer Staging  No matching staging information was found for the patient.    Prior Therapy: None  Current Therapy: Under workup   HISTORY OF PRESENT ILLNESS:   Oncology History   No history exists.      [name] is a 78 y.o. male presenting to clinic today for evaluation of lytic bone lesions at the request of Anthony Oman, MD.  Patient has a medical history of TIA, DM type II, peripheral neuropathy, CAD s/p CABG in 2017 and percutaneous coronary angioplasty, peripheral vascular disease, CKD stage 3, hyperlipidemia, GERD, and hypertension.  He was admitted to the hospital from  12/07/23 to 12/10/23 for generalized weakness, fatigue, and SOB. Imaging was concerning for pneumonia and IV antibiotics were given x 3 days. MRI of the thoracic spine incidentally found: Pathologic compression fracture at T9-10 with abnormal enhancement extending into an expanded right pedicle.  Multiple enhancing lesions throughout the thoracic spine as described compatible with metastatic disease or multiple myeloma. Multiple rib lesions bilaterally without a pathologic rib fracture. Central T2 hyperintensity in the cord at the T2-3 level is nonspecific but may represent a small focus of myelomalacia. No significant central canal stenosis. Facet hypertrophy contributes to right greater than left foraminal stenosis from T8-9 through T11-12. Moderate right foraminal stenosis is also present at T7-8.  MRI Lumbar spine also showed: Multiple enhancing lesions throughout the lumbar spine, visualized sacrum and iliac bones compatible with metastatic disease or multiple myeloma. No pathologic fractures. Abnormal signal in the proximal left iliac artery raises concern for occlusion or slow flow. 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 Mild left subarticular and moderate left foraminal stenosis at L2-3. Mild subarticular and moderate foraminal stenosis bilaterally at L3-4, right greater than left. Moderate to severe central canal stenosis at L4-5 with right greater than left subarticular narrowing. Moderate foraminal stenosis bilaterally at L4-5 is worse on the left. Shallow right paramedian disc protrusion at L5-S1 potentially contacts the traversing right L5 nerve roots. Moderate right foraminal stenosis at L5-S1.  [name] was briefly admitted to the hospital from 12/10/23 to 12/11/23 after a fall due to syncope, in which he hit the back of his head. Imaging showed no acute injuries and CTA was negative for PE. He was found to be orthostatically hypotensive and given IV fluids, as well as IV antibiotics.   Multiple Myeloma Panel fron 12/07/23 showed elevated Alpha 2 globulin  at 1.3 and Dunn-spike was not observed. Urine protein electrophoresis from 12/07/23 was normal.   Today, he states that he is doing well overall. His appetite level is at 100%. His energy level is at 50%. He is accompanied by  family members.   [name] states symptoms began after his first hospitalization in February 2025 for pneumonia. His pneumonia has not resolved since. [name] reports he is susceptible to pneumonia due to a medical condition he cannot name. He has regular follow-up with his PCP in [place], Dr. Sharion Davidson.  He notes right lower chest wall pain that began in March 2025, after helping his wife put on compression socks. He believes he may have pulled a muscle doing so and pain has improved since its onset. [name] has a prescription of tramadol  at home but has not required any for pain.  His wife states Parker had back pain during his hospitalization from 12/07/23 to 12/10/23. His daughter reports he was lifting weights strenuously 1 week prior to this and may be attributable to back pain.   [name] reports a dry cough with occasional phlegm that is worsened at night. He notes he is not able to walk straight and has peripheral neuropathy in the form of numbness in the feet. He reports a normal appetite, though he states he does not drink an adequate amount of water . He denies any weakness in the extremities, fevers, night sweats, or unintentional weight loss.   [name] has a history of skin cancer, not melanoma, and deniesn any other history of cancer. He follows up with Dr. Luna Salinas for a nodule that [facility] states has not grown in years.   His daughter states he has conjunctival hemorrhage on the left eye and  was told while he was hospitalized this may be due to the IV heparin  he received.   INTERVAL HISTORY:   [name] is a 78 y.o. male presenting to the clinic today for follow-up of metastatic lytic bone lesions. He was last seen by me on 12/18/23 in consultation.  Since his last visit, he underwent initial PET on 12/27/23 that found: Multifocal mild areas of abnormal uptake along the skeleton including several ribs, vertebral bodies and scattered along the pelvis. Some of these lesions are associated with areas of  bony sclerosis. Bilateral rib fractures are seen. The right-sided rib fracture appears more subacute in the left more acute. No soft tissue areas of abnormal uptake including along lymph nodes are in the spleen. Slight uptake along patchy left lung opacities and towards the lingula. Extensive vascular calcifications identified. Postop chest. 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 Multiple stents. Cardiomegaly. Gallstones. Slight wall thickening of the stomach. Bilateral renal atrophy.  He had IR bone biopsy on 01/15/24 with pathology results pending.  Tamer has been admitted to the hospital twice. His first admission was from 12/28/23 to  01/03/24 for lobar pneumonia. His second admission was from 01/13/24 to 01/17/24 for pneumonia.   Today, he states that he is doing well overall. His appetite level is at ***%. His energy level is at ***%.   PAST MEDICAL HISTORY:   Past Medical History: 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   x 5   Hypercholesteremia    Hypertension    Neuropathy    Peripheral vascular disease (HCC)    s/p R-L FEM-FEM BYPASS   Pneumonia    Tachycardia    after CABG, pt on medicine for this    Surgical History: Past Surgical History:  Procedure Laterality Date   ABDOMINAL AORTOGRAM Bilateral 09/19/2016   Procedure: iliac;  Surgeon: Margherita Shell, MD;  [facility];  Service: Cardiovascular;  Laterality: Bilateral;   ABDOMINAL AORTOGRAM W/LOWER EXTREMITY N/A 09/22/2021   Procedure: ABDOMINAL AORTOGRAM W/LOWER EXTREMITY;  Surgeon: Young Hensen, MD;  [facility]: [facility];  Service: Cardiovascular;  Laterality:  N/A;   ABDOMINAL AORTOGRAM W/LOWER EXTREMITY N/A 10/05/2022   Procedure: ABDOMINAL AORTOGRAM W/LOWER EXTREMITY;  Surgeon: Young Hensen, MD;  [facility]: [facility];  Service: Cardiovascular;  Laterality: N/A;   BIOPSY  05/16/2023   Procedure: BIOPSY;  Surgeon: Suzette Espy, MD;  [facility];  Service: Endoscopy;;   CARDIAC CATHETERIZATION  2003   with stent   CARDIAC CATHETERIZATION N/A 10/11/2015   Procedure: Left Heart Cath and Coronary Angiography;  Surgeon: Peter Dunn [place], MD;  [facility] INV[facility]IVE [facility];  Service: Cardiovascular;  Laterality: N/A;   COLON RESECTION N/A 12/13/2017   Procedure: EXPLORATORY LAPAROTOMY, SIGMOID COLECTOMY WITH COLOSTOMY;  Surgeon: Dareen Ebbing, MD;  Location: [facility];  Service: General;  Laterality: N/A;   COLONOSCOPY N/A 09/22/2013   Procedure: COLONOSCOPY;  Surgeon: Suzette Espy, MD;  [facility];  Service: Endoscopy;  Laterality: N/A;  9:30 AM   COLONOSCOPY N/A 02/19/2018   Procedure: COLONOSCOPY;  Surgeon: Garnette Ka  M, MD;  [facility];  Service: Endoscopy;  Laterality: N/A;  2:00pm   COLONOSCOPY WITH PROPOFOL  N/A 05/16/2023   Procedure: COLONOSCOPY WITH PROPOFOL ;  Surgeon: Suzette Espy, MD;  [facility];  Service: Endoscopy;  Laterality: N/A;  730am, asa 3   COLOSTOMY N/A 12/13/2017   Procedure: COLOSTOMY;  Surgeon: Dareen Ebbing, MD;  Location: [facility] OR;  Service: General;  Laterality: N/A;   COLOSTOMY REVERSAL N/A 03/26/2018   Procedure: COLOSTOMY REVERSAL;  Surgeon: Dareen Ebbing, MD;  [facility];  Service: General;  Laterality: N/A;   CORONARY ARTERY BYPASS GRAFT N/A 10/18/2015   Procedure: CORONARY ARTERY BYPASS GRAFTING (CABG) x  five, using left internal mammary artery and right leg greater saphenous vein harvested endoscopically;  Surgeon: Zelphia Higashi, MD;  [facility]: [facility];  Service: Open Heart Surgery;  Laterality: N/A;   ENDARTERECTOMY FEMORAL Right  08/09/2016   Procedure: ENDARTERECTOMY FEMORAL WITH VEIN PATCH ANGIOPLASTY;  Surgeon: Margherita Shell, MD;  [facility];  Service: Vascular;  Laterality: Right;   ESOPHAGOGASTRODUODENOSCOPY N/A 10/24/2017   Dr. Riley Cheadle: hiatal hernia   ESOPHAGOGASTRODUODENOSCOPY N/A 01/01/2024   Procedure: EGD (ESOPHAGOGASTRODUODENOSCOPY);  Surgeon: Hargis Lias, MD;  [facility];  Service: Endoscopy;  Laterality: N/A;   ESOPHAGOGASTRODUODENOSCOPY (EGD) WITH PROPOFOL  N/A 05/16/2023   Procedure: ESOPHAGOGASTRODUODENOSCOPY (EGD) WITH PROPOFOL ;  Surgeon: Suzette Espy, MD;  [facility];  Service: Endoscopy;  Laterality: N/A;   FEMORAL-FEMORAL BYPASS GRAFT Bilateral 08/09/2016   Procedure: REVISION BYPASS GRAFT RIGHT FEMORAL-LEFT FEMORAL ARTERY;  Surgeon: Margherita Shell, MD;  Location: [facility] OR;  Service: Vascular;  Laterality: Bilateral;   FEMORAL-POPLITEAL BYPASS GRAFT     INTRAMEDULLARY (IM) NAIL INTERTROCHANTERIC Left 04/03/2021   Procedure: LEFT  HIP INTRAMEDULLARY (IM) NAIL INTERTROCHANTRIC;  Surgeon: Arnie Lao, MD;  [facility]: [facility];  Service: Orthopedics;  Laterality: Left;   IR FLUORO GUIDED NEEDLE PLC ASPIRATION/INJECTION LOC  12/26/2023   IR IVC FILTER RETRIEVAL / S&I Dan Dun GUID/MOD SED  01/15/2024   MALONEY DILATION N/A 05/16/2023   Procedure: Londa Rival DILATION;  Surgeon: Suzette Espy, MD;  [facility];  Service: Endoscopy;  Laterality: N/A;   PERIPHERAL VASCULAR BALLOON ANGIOPLASTY Right 09/22/2021   Procedure: PERIPHERAL VASCULAR BALLOON ANGIOPLASTY;  Surgeon: Young Hensen, MD;  [facility];  Service: Cardiovascular;  Laterality: Right;   PERIPHERAL VASCULAR CATHETERIZATION N/A 05/08/2016   Procedure: Lower Extremity Angiography;  Surgeon: Anthony Leigh, MD;  Location: [facility] INVASIVE CV LAB;  Service: Cardiovascular;  Laterality: N/A;   PERIPHERAL VASCULAR INTERVENTION Right 09/19/2016   Procedure: Peripheral Vascular  Intervention;  Surgeon: Margherita Shell, MD;  [facility];  Service: Cardiovascular;  Laterality: Right;  ext iliac stent   PERIPHERAL VASCULAR INTERVENTION Right 09/22/2021   Procedure: PERIPHERAL VASCULAR INTERVENTION;  Surgeon: Young Hensen, MD;  [facility];  Service: Cardiovascular;  Laterality: Right;   POLYPECTOMY  02/19/2018   Procedure: POLYPECTOMY;  Surgeon: Suzette Espy, MD;  [facility];  Service: Endoscopy;;   PROCTOSCOPY  10/24/2017   Procedure: PROCTOSCOPY;  Surgeon: Suzette Espy, MD;  [facility];  Service: Endoscopy;;   TEE WITHOUT CARDIOVERSION N/A 10/18/2015   Procedure: TRANSESOPHAGEAL ECHOCARDIOGRAM (TEE);  Surgeon: Zelphia Higashi, MD;  Location: [facility] OR;  Service: Open Heart Surgery;  Laterality: N/A;    Social History: Social History   Socioeconomic History   Marital status:  Married    Spouse name: Not on file   Number of children: Not on file   Years of education: Not on file   Highest education level: Not on file  Occupational History   Not on file  Tobacco Use   Smoking status: Former    Current packs/day: 0.00    Average packs/day: 2.0 packs/day for 30.0 years (60.0 ttl pk-yrs)    Types: Cigarettes    Start date: 08/14/1970    Quit date: 08/14/2000    Years since quitting: 23.4   Smokeless tobacco: Never  Vaping Use   Vaping status: Never Used  Substance and Sexual Activity   Alcohol  use: No   Drug use: No   Sexual activity: Not Currently  Other Topics Concern   Not on file  Social History Narrative   Not on file   Social Drivers of Health   Financial Resource Strain: Low Risk  (10/08/2023)   Received from [facility]   Overall Financial Resource Strain (CARDIA)    Difficulty of Paying Living Expenses: Not very hard  Food Insecurity: No Food Insecurity (01/13/2024)   Hunger Vital Sign    Worried About Running Out of Food in the Last Year: Never true    Ran Out of Food  in the Last Year: Never true  Recent Concern: Food Insecurity - Food Insecurity Present (12/18/2023)   Hunger Vital Sign    Worried About Running Out of Food in the Last Year: Sometimes true    Ran Out of Food in the Last Year: Sometimes true  Transportation Needs: No Transportation Needs (01/13/2024)   PRAPARE - Administrator, Civil Service (Medical): No    Lack of Transportation (Non-Medical): No  Physical Activity: Not on file  Stress: Not on file  Social Connections: Socially Integrated (01/13/2024)   Social Connection and Isolation Panel [NHANES]    Frequency of Communication with Friends and Family: More than three times a week    Frequency of Social Gatherings with Friends and Family: Three times a week    Attends Religious Services: More than 4 times per year    Active Member of Clubs or Organizations: Yes    Attends Banker Meetings: 1 to 4 times per year    Marital Status: Married  Catering manager Violence: Not At Risk (01/13/2024)   Humiliation, Afraid, Rape, and Kick questionnaire    Fear of Current or Ex-Partner: No    Emotionally Abused: No    Physically Abused: No    Sexually Abused: No    Family History: Family History  Problem Relation Age of Onset   Heart attack Mother    Stroke Mother    Colon cancer Brother        late 56s    Current Medications:  Current Outpatient Medications:    albuterol  (VENTOLIN  HFA) 108 (90 Base) MCG/ACT inhaler, Inhale 2 puffs into the lungs every 6 (six) hours as needed for wheezing or shortness of breath., Disp: 8 g, Rfl: 2   amLODipine  (NORVASC ) 10 MG tablet, Take 10 mg by mouth in the morning., Disp: , Rfl:    aspirin  EC 81 MG tablet, Take 1 tablet (81 mg total) by mouth daily with breakfast., Disp: 30 tablet, Rfl: 2   cefdinir  (OMNICEF ) 300 MG capsule, Take 1 capsule (300 mg total) by mouth 2 (two) times daily for 5 days., Disp: 10 capsule, Rfl: 0   clopidogrel  (PLAVIX ) 75 MG tablet, Take 75 mg by mouth  in  the morning., Disp: , Rfl:    dextromethorphan -guaiFENesin  (MUCINEX  DM) 30-600 MG 12hr tablet, Take 1 tablet by mouth 2 (two) times daily., Disp: 20 tablet, Rfl: 0   Fluticasone-Umeclidin-Vilant (TRELEGY ELLIPTA) 200-62.5-25 MCG/ACT AEPB, Inhale 2 puffs into the lungs daily., Disp: , Rfl:    gabapentin  (NEURONTIN ) 300 MG capsule, Take 1 capsule (300 mg total) by mouth 2 (two) times daily., Disp: 60 capsule, Rfl: 0   insulin  glargine (LANTUS ) 100 UNIT/ML injection, Inject 0.1 mLs (10 Units total) into the skin at bedtime. (Patient taking differently: Inject 20 Units into the skin at bedtime.), Disp: , Rfl:    losartan  (COZAAR ) 50 MG tablet, Take 50 mg by mouth daily., Disp: , Rfl:    metoprolol  tartrate (LOPRESSOR ) 50 MG tablet, Take 25 mg by mouth 2 (two) times daily. , Disp: , Rfl:    metroNIDAZOLE  (FLAGYL ) 500 MG tablet, Take 1 tablet (500 mg total) by mouth 2 (two) times daily for 5 days., Disp: 10 tablet, Rfl: 0   oxyCODONE  (OXY IR/ROXICODONE ) 5 MG immediate release tablet, Take 1 tablet (5 mg total) by mouth every 6 (six) hours as needed for up to 5 days for severe pain (pain score 7-10)., Disp: 20 tablet, Rfl: 0   pantoprazole  (PROTONIX ) 40 MG tablet, Take 1 tablet (40 mg total) by mouth 2 (two) times daily., Disp: 60 tablet, Rfl: 0   vitamin B-12 (CYANOCOBALAMIN ) 1000 MCG tablet, Take 1,000 mcg by mouth in the morning., Disp: , Rfl:    Allergies: Allergies  Allergen Reactions   Acarbose Diarrhea    REVIEW OF SYSTEMS:   Review of Systems  Constitutional:  Negative for chills, fatigue and fever.  HENT:   Negative for lump/mass, mouth sores, nosebleeds, sore throat and trouble swallowing.   Eyes:  Negative for eye problems.  Respiratory:  Negative for cough and shortness of breath.   Cardiovascular:  Negative for chest pain, leg swelling and palpitations.  Gastrointestinal:  Negative for abdominal pain, constipation, diarrhea, nausea and vomiting.  Genitourinary:  Negative for bladder  incontinence, difficulty urinating, dysuria, frequency, hematuria and nocturia.   Musculoskeletal:  Negative for arthralgias, back pain, flank pain, myalgias and neck pain.  Skin:  Negative for itching and rash.  Neurological:  Negative for dizziness, headaches and numbness.  Hematological:  Does not bruise/bleed easily.  Psychiatric/Behavioral:  Negative for depression, sleep disturbance and suicidal ideas. The patient is not nervous/anxious.   All other systems reviewed and are negative.    VITALS:   There were no vitals taken for this visit.  Wt Readings from Last 3 Encounters:  01/13/24 140 lb 10.5 oz (63.8 kg)  01/01/24 141 lb (64 kg)  12/26/23 141 lb (64 kg)    There is no height or weight on file to calculate BMI.  Performance status (ECOG): 1 - Symptomatic but completely ambulatory  PHYSICAL EXAM:   Physical Exam Vitals and nursing note reviewed. Exam conducted with a chaperone present.  Constitutional:      Appearance: Normal appearance.  Cardiovascular:     Rate and Rhythm: Normal rate and regular rhythm.     Pulses: Normal pulses.     Heart sounds: Normal heart sounds.  Pulmonary:     Effort: Pulmonary effort is normal.     Breath sounds: Normal breath sounds.  Abdominal:     Palpations: Abdomen is soft. There is no hepatomegaly, splenomegaly or mass.     Tenderness: There is no abdominal tenderness.  Musculoskeletal:  Right lower leg: No edema.     Left lower leg: No edema.  Lymphadenopathy:     Cervical: No cervical adenopathy.     Right cervical: No superficial, deep or posterior cervical adenopathy.    Left cervical: No superficial, deep or posterior cervical adenopathy.     Upper Body:     Right upper body: No supraclavicular or axillary adenopathy.     Left upper body: No supraclavicular or axillary adenopathy.  Neurological:     General: No focal deficit present.     Mental Status: He is alert and oriented to person, place, and time.   Psychiatric:        Mood and Affect: Mood normal.        Behavior: Behavior normal.     LABS:   CBC    Component Value Date/Time   WBC 9.5 01/17/2024 0430   RBC 3.05 (L) 01/17/2024 0430   HGB 8.1 (L) 01/17/2024 0430   HGB 10.8 (L) 02/20/2023 1253   HCT 25.6 (L) 01/17/2024 0430   HCT 34.6 (L) 02/20/2023 1253   PLT 316 01/17/2024 0430   PLT 456 (H) 02/20/2023 1253   MCV 83.9 01/17/2024 0430   MCV 81 02/20/2023 1253   MCH 26.6 01/17/2024 0430   MCHC 31.6 01/17/2024 0430   RDW 17.6 (H) 01/17/2024 0430   RDW 15.4 02/20/2023 1253   LYMPHSABS 0.7 01/13/2024 1551   LYMPHSABS 1.3 02/20/2023 1253   MONOABS 2.0 (H) 01/13/2024 1551   EOSABS 0.0 01/13/2024 1551   EOSABS 0.3 02/20/2023 1253   BASOSABS 0.1 01/13/2024 1551   BASOSABS 0.1 02/20/2023 1253    CMP    Component Value Date/Time   NA 134 (L) 01/17/2024 0430   NA 139 02/20/2023 1253   K 4.8 01/17/2024 0430   CL 102 01/17/2024 0430   CO2 22 01/17/2024 0430   GLUCOSE 132 (H) 01/17/2024 0430   BUN 30 (H) 01/17/2024 0430   BUN 24 02/20/2023 1253   CREATININE 2.08 (H) 01/17/2024 0430   CREATININE 1.28 (H) 04/25/2016 1038   CALCIUM  8.2 (L) 01/17/2024 0430   PROT 5.9 (L) 01/13/2024 1551   PROT 6.8 02/20/2023 1253   ALBUMIN  2.8 (L) 01/13/2024 1551   ALBUMIN  4.3 02/20/2023 1253   AST 12 (L) 01/13/2024 1551   ALT 13 01/13/2024 1551   ALKPHOS 81 01/13/2024 1551   BILITOT 0.8 01/13/2024 1551   BILITOT 0.4 02/20/2023 1253   GFRNONAA 32 (L) 01/17/2024 0430   GFRAA >60 05/22/2019 0611     Lab Results  Component Value Date   CEA1 3.6 12/18/2023   /  CEA  Date Value Ref Range Status  12/18/2023 3.6 0.0 - 4.7 ng/mL Final    Comment:    (NOTE)                             Nonsmokers          <3.9                             Smokers             <5.6 [employer] Electrochemiluminescence Immunoassay (ECLIA) Values obtained with different assay methods or kits cannot be used interchangeably.  Results cannot  be interpreted as absolute evidence of the presence or absence of malignant disease. Performed At: [facility] [address] [place], [place] [id number] Nagendra  Laymond Priestly MD FA:[phone]    No results found for: "PSA1" Lab Results  Component Value Date   ONG295 17 12/18/2023   No results found for: "MWU132"  Lab Results  Component Value Date   TOTALPROTELP 7.0 12/07/2023   No results found for: "TIBC", "FERRITIN", "IRONPCTSAT" Lab Results  Component Value Date   LDH 162 12/18/2023     STUDIES:   IR BONE BIOPSY W IMG GUIDE Result Date: 01/18/2024 INDICATION: Abnormal MRI of the lumbosacral spine suspicious of metastatic disease. EXAM: FLUOROSCOPIC GUIDED CORE BIOPSY AT L3 MEDICATIONS: None. ANESTHESIA/SEDATION: Moderate (conscious) sedation was employed during this procedure. A total of Versed  2 mg and Fentanyl  75 mcg was administered intravenously by the radiology nurse. Total intra-service moderate Sedation Time: 19 minutes. The patient's level of consciousness and vital signs were monitored continuously by radiology nursing throughout the procedure under my direct supervision. Fluoro time: 4 minutes 36 seconds.  325 mGy. COMPLICATIONS: None immediate. PROCEDURE: Informed written consent was obtained from the patient after a thorough discussion of the procedural risks, benefits and alternatives. All questions were addressed. Maximal Sterile Barrier Technique was utilized including caps, mask, sterile gowns, sterile gloves, sterile drape, hand hygiene and skin antiseptic. A timeout was performed prior to the initiation of the procedure. With the patient prone, the lumbosacral region was prepped and draped in the usual sterile fashion. The L3 vertebral body right pedicle was identified on AP and lateral projections. The overlying skin was then infiltrated with 0.25% bupivacaine  and overlying paraspinal musculature in the right pedicle. Using biplane intermittent fluoroscopy 2 passes  were made with a 13 gauge Cook spinal needle and a 16 gauge core biopsy needle, and 1 pass with an 11 gauge Jamshidi needle and Kyphon osteo drill. Approximately 8 cc of mostly bloody thick aspirated with a few chunks of core tissue was obtained and sent for pathologic analysis. Hemostasis was achieved at the skin entry site. The patient tolerated the procedure well. There were no acute complications. Patient was then returned in stable and good condition. IMPRESSION: Status post fluoroscopic guided core biopsies at L3 via a right transpedicular approach with the retrieval of core tissue as described above. Electronically Signed   By: Luellen Sages Dunn.D.   On: 01/18/2024 08:26   DG Swallowing Func-Speech Pathology Result Date: 01/16/2024 Table formatting from the original result was not included. Modified Barium Swallow Study Patient Details Name: [name] MRN: [id number] Date of Birth: 11/08/1945 Today's Date: 01/16/2024 HPI/PMH: HPI: Pt is a 78 y.o. male with PMHx of PNA (2019, April 2025, and May 2025), Bell's Palsy (2019), previous esoph[facility] 10/16/2017 [facility] except for prominent CP bar. Daughter reports he was recently diagnosed with COPD (unable to verify in chart). Other medical hx includes coronary artery disease status post CABG, hypertension, diabetes mellitus type 2, hyperlipidemia, CKD stage IV, diverticulitis status post colostomy and subsequent takedown, atrial tachycardia, peripheral vascular disease, TIA. Pt recently in the hospital from 12/07/2023 - 12/10/2023 as well as 12/28/2023 - 01/03/2024 for treatment of PNA. Chest x-ray 01/13/24: "Increased patchy opacities at the left mid to upper lung zone.  Similar streaky opacities in the right upper lung and slightly increased streaky right basilar opacities. These findings could reflect multifocal pneumonia, aspiration, or edema." Clinical Impression: Pt presents with oropharyngeal function that is [facility] with no observed aspiration across consistencies.  Oral phase was timely and unremarkable. Flash penetration into laryngeal vestibule during the swallow observed with thin liquids during consecutive sips administered via cup and  straw. Slightly reduced base of tongue retraction leading to trace BOT residue. Slightly diminished pharyngeal stripping wave resulting in trace pharyngeal residue in the valleculae, which cleared with subsequent reflexive swallow. Hyolaryngeal excusion was intact. Esophageal sweep was limited but unremarkable. Recommend continue regular textures with thin liquids with safe swallow precautions of small bites/sips, slow rate, alternate solids and liquids, upright for all PO and for at least 30 minute after PO. Factors that may increase risk of adverse event in presence of aspiration Roderick Civatte & Jessy Morocco 2021): Factors that may increase risk of adverse event in presence of aspiration Roderick Civatte & Jessy Morocco 2021): Poor general health and/or compromised immunity; Respiratory or GI disease Recommendations/Plan: Swallowing Evaluation Recommendations Swallowing Evaluation Recommendations Recommendations: PO diet Liquid Administration via: Cup; Straw Medication Administration: Whole meds with liquid Supervision: Patient able to self-feed Swallowing strategies  : Slow rate; Small bites/sips; Follow solids with liquids Postural changes: Position pt fully upright for meals; Stay upright 30-60 min after meals Oral care recommendations: Oral care BID (2x/day) Treatment Plan Treatment Plan Treatment recommendations: No treatment recommended at this time Follow-up recommendations: No SLP follow up Functional status assessment: Patient has had a recent decline in their functional status and demonstrates the ability to make significant improvements in function in a reasonable and predictable amount of time. Recommendations Recommendations for follow up therapy are one component of a multi-disciplinary discharge planning process, led by the attending physician.   Recommendations may be updated based on patient status, additional functional criteria and insurance authorization. Assessment: Orofacial Exam: Orofacial Exam Oral Cavity: Oral Hygiene: WFL Oral Cavity - Dentition: Missing dentition (partials on top (not present for study)) Orofacial Anatomy: WFL Oral Motor/Sensory Function: WFL Anatomy: Anatomy: Other (Comment) (hx of CP bar) Boluses Administered: Boluses Administered Boluses Administered: Thin liquids (Level 0); Mildly thick liquids (Level 2, nectar thick); Moderately thick liquids (Level 3, honey thick); Puree; Solid (Barium pill)  Oral Impairment Domain: Oral Impairment Domain Lip Closure: No labial escape Tongue control during bolus hold: Cohesive bolus between tongue to palatal seal Bolus preparation/mastication: Timely and efficient chewing and mashing Bolus transport/lingual motion: Brisk tongue motion Oral residue: Trace residue lining oral structures Location of oral residue : Tongue (base of tongue) Initiation of pharyngeal swallow : Pyriform sinuses; Posterior angle of the ramus Pharyngeal Impairment Domain: Pharyngeal Impairment Domain Soft palate elevation: No bolus between soft palate (SP)/pharyngeal wall (PW) Laryngeal elevation: Complete superior movement of thyroid  cartilage with complete approximation of arytenoids to epiglottic petiole Anterior hyoid excursion: Partial anterior movement Epiglottic movement: Complete inversion Laryngeal vestibule closure: Incomplete, narrow column air/contrast in laryngeal vestibule Pharyngeal stripping wave : Present - diminished Pharyngeal contraction (A/P view only): N/A Pharyngoesophageal segment opening: Complete distension and complete duration, no obstruction of flow Tongue base retraction: Trace column of contrast or air between tongue base and PPW Pharyngeal residue: Trace residue within or on pharyngeal structures Location of pharyngeal residue: Valleculae; Tongue base  Esophageal Impairment Domain:  Esophageal Impairment Domain Esophageal clearance upright position: Complete clearance, esophageal coating (Limited view with sweep) Pill: Pill Consistency administered: Thin liquids (Level 0) Penetration/Aspiration Scale Score: Penetration/Aspiration Scale Score 1.  Material does not enter airway: Pill; Solid; Puree; Moderately thick liquids (Level 3, honey thick); Mildly thick liquids (Level 2, nectar thick); Thin liquids (Level 0) 2.  Material enters airway, remains ABOVE vocal cords then ejected out: Thin liquids (Level 0) Compensatory Strategies: No data recorded  General Information: Caregiver present: No  Diet Prior to this Study: Regular; Thin liquids (Level 0)  Temperature : Normal   Respiratory Status: WFL   Supplemental O2: None (Room air)   History of Recent Intubation: No  Behavior/Cognition: Alert; Cooperative; Pleasant mood Self-Feeding Abilities: Able to self-feed Baseline vocal quality/speech: Normal Volitional Cough: Able to elicit Volitional Swallow: Able to elicit No data recorded Pain: Pain Assessment Pain Assessment: No/denies pain End of Session: Start Time:SLP Start Time (ACUTE ONLY): 1425 Stop Time: SLP Stop Time (ACUTE ONLY): 1505 Time Calculation:SLP Time Calculation (min) (ACUTE ONLY): 40 min Charges: SLP Evaluations $ SLP Speech Visit: 1 Visit SLP Evaluations $MBS Swallow: 1 Procedure SLP visit diagnosis: SLP Visit Diagnosis: Dysphagia, unspecified (R13.10) Past Medical History: Past Medical History: Diagnosis Date  Anemia   CAD S/P percutaneous coronary angioplasty 1998  PCI TO CX  CKD (chronic kidney disease) stage 3, GFR 30-59 ml/min (HCC)   Diabetes mellitus without complication (HCC)   Diverticulitis 08/16/2017  hospitalized with diverticulitis/sepsis  GERD (gastroesophageal reflux disease)   occassionally  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  Hypercholesteremia   Hypertension   Neuropathy   Peripheral vascular disease (HCC)   s/p R-L FEM-FEM BYPASS  Pneumonia   Tachycardia   after CABG, pt on  medicine for this Past Surgical History: Past Surgical History: Procedure Laterality Date  ABDOMINAL AORTOGRAM Bilateral 09/19/2016  Procedure: iliac;  Surgeon: Margherita Shell, MD;  [facility]: [facility];  Service: Cardiovascular;  Laterality: Bilateral;  ABDOMINAL AORTOGRAM W/LOWER EXTREMITY N/A 09/22/2021  Procedure: ABDOMINAL AORTOGRAM W/LOWER EXTREMITY;  Surgeon: Young Hensen, MD;  Location: [facility];  Service: Cardiovascular;  Laterality: N/A;  ABDOMINAL AORTOGRAM W/LOWER EXTREMITY N/A 10/05/2022  Procedure: ABDOMINAL AORTOGRAM W/LOWER EXTREMITY;  Surgeon: Young Hensen, MD;  [facility]: [facility];  Service: Cardiovascular;  Laterality: N/A;  BIOPSY  05/16/2023  Procedure: BIOPSY;  Surgeon: Suzette Espy, MD;  [facility];  Service: Endoscopy;;  CARDIAC CATHETERIZATION  2003  with stent  CARDIAC CATHETERIZATION N/A 10/11/2015  Procedure: Left Heart Cath and Coronary Angiography;  Surgeon: Peter Dunn [place], MD;  Location: [facility] CV LAB;  Service: Cardiovascular;  Laterality: N/A;  COLON RESECTION N/A 12/13/2017  Procedure: EXPLORATORY LAPAROTOMY, SIGMOID COLECTOMY WITH COLOSTOMY;  Surgeon: Dareen Ebbing, MD;  Location: [facility];  Service: General;  Laterality: N/A;  COLONOSCOPY N/A 09/22/2013  Procedure: COLONOSCOPY;  Surgeon: Suzette Espy, MD;  [facility];  Service: Endoscopy;  Laterality: N/A;  9:30 AM  COLONOSCOPY N/A 02/19/2018  Procedure: COLONOSCOPY;  Surgeon: Suzette Espy, MD;  [facility];  Service: Endoscopy;  Laterality: N/A;  2:00pm  COLONOSCOPY WITH PROPOFOL  N/A 05/16/2023  Procedure: COLONOSCOPY WITH PROPOFOL ;  Surgeon: Suzette Espy, MD;  [facility];  Service: Endoscopy;  Laterality: N/A;  730am, asa 3  COLOSTOMY N/A 12/13/2017  Procedure: COLOSTOMY;  Surgeon: Dareen Ebbing, MD;  Location: [facility] OR;  Service: General;  Laterality: N/A;  COLOSTOMY REVERSAL N/A 03/26/2018  Procedure: COLOSTOMY REVERSAL;   Surgeon: Dareen Ebbing, MD;  Location: [facility];  Service: General;  Laterality: N/A;  CORONARY ARTERY BYPASS GRAFT N/A 10/18/2015  Procedure: CORONARY ARTERY BYPASS GRAFTING (CABG) x  five, using left internal mammary artery and right leg greater saphenous vein harvested endoscopically;  Surgeon: Zelphia Higashi, MD;  Location: [facility];  Service: Open Heart Surgery;  Laterality: N/A;  ENDARTERECTOMY FEMORAL Right 08/09/2016  Procedure: ENDARTERECTOMY FEMORAL WITH VEIN PATCH ANGIOPLASTY;  Surgeon: Margherita Shell, MD;  Location: [facility];  Service: Vascular;  Laterality:  Right;  ESOPHAGOGASTRODUODENOSCOPY N/A 10/24/2017  Dr. Riley Cheadle: hiatal hernia  ESOPHAGOGASTRODUODENOSCOPY N/A 01/01/2024  Procedure: EGD (ESOPHAGOGASTRODUODENOSCOPY);  Surgeon: Hargis Lias, MD;  [facility];  Service: Endoscopy;  Laterality: N/A;  ESOPHAGOGASTRODUODENOSCOPY (EGD) WITH PROPOFOL  N/A 05/16/2023  Procedure: ESOPHAGOGASTRODUODENOSCOPY (EGD) WITH PROPOFOL ;  Surgeon: Suzette Espy, MD;  [facility];  Service: Endoscopy;  Laterality: N/A;  FEMORAL-FEMORAL BYPASS GRAFT Bilateral 08/09/2016  Procedure: REVISION BYPASS GRAFT RIGHT FEMORAL-LEFT FEMORAL ARTERY;  Surgeon: Margherita Shell, MD;  Location: [facility] OR;  Service: Vascular;  Laterality: Bilateral;  FEMORAL-POPLITEAL BYPASS GRAFT    INTRAMEDULLARY (IM) NAIL INTERTROCHANTERIC Left 04/03/2021  Procedure: LEFT  HIP INTRAMEDULLARY (IM) NAIL INTERTROCHANTRIC;  Surgeon: Arnie Lao, MD;  Location: [facility];  Service: Orthopedics;  Laterality: Left;  IR FLUORO GUIDED NEEDLE PLC ASPIRATION/INJECTION LOC  12/26/2023  MALONEY DILATION N/A 05/16/2023  Procedure: MALONEY DILATION;  Surgeon: Suzette Espy, MD;  [facility];  Service: Endoscopy;  Laterality: N/A;  PERIPHERAL VASCULAR BALLOON ANGIOPLASTY Right 09/22/2021  Procedure: PERIPHERAL VASCULAR BALLOON ANGIOPLASTY;  Surgeon: Young Hensen, MD;  [facility];  Service:  Cardiovascular;  Laterality: Right;  PERIPHERAL VASCULAR CATHETERIZATION N/A 05/08/2016  Procedure: Lower Extremity Angiography;  Surgeon: Anthony Leigh, MD;  Location: [facility] INVASIVE CV LAB;  Service: Cardiovascular;  Laterality: N/A;  PERIPHERAL VASCULAR INTERVENTION Right 09/19/2016  Procedure: Peripheral Vascular Intervention;  Surgeon: Margherita Shell, MD;  [facility];  Service: Cardiovascular;  Laterality: Right;  ext iliac stent  PERIPHERAL VASCULAR INTERVENTION Right 09/22/2021  Procedure: PERIPHERAL VASCULAR INTERVENTION;  Surgeon: Young Hensen, MD;  [facility];  Service: Cardiovascular;  Laterality: Right;  POLYPECTOMY  02/19/2018  Procedure: POLYPECTOMY;  Surgeon: Suzette Espy, MD;  [facility];  Service: Endoscopy;;  PROCTOSCOPY  10/24/2017  Procedure: PROCTOSCOPY;  Surgeon: Suzette Espy, MD;  [facility];  Service: Endoscopy;;  TEE WITHOUT CARDIOVERSION N/A 10/18/2015  Procedure: TRANSESOPHAGEAL ECHOCARDIOGRAM (TEE);  Surgeon: Zelphia Higashi, MD;  Location: [facility] OR;  Service: Open Heart Surgery;  Laterality: N/A; Caretha Chapel, MA CCC-SLP Speech-Language Pathologist 01/16/2024, 3:53 PM  DG Chest Port 1 View Result Date: 01/13/2024 CLINICAL DATA:  Fever.  Decreased lung sounds. EXAM: PORTABLE CHEST 1 VIEW COMPARISON:  01/01/2024. FINDINGS: Stable cardiomediastinal silhouette. Prior median sternotomy and CABG. Increased patchy opacities at the left mid to upper lung zone. Similar streaky opacities in the right upper lung and slightly increased streaky right basilar opacities. No large pleural effusion. No pneumothorax. Known bilateral rib fractures, better evaluated on the prior CT. IMPRESSION: Increased patchy opacities at the left mid to upper lung zone. Similar streaky opacities in the right upper lung and slightly increased streaky right basilar opacities. These findings could reflect multifocal pneumonia, aspiration, or  edema. Electronically Signed   By: Mannie Seek Dunn.D.   On: 01/13/2024 16:24   CT HEAD WO CONTRAST ( ) Result Date: 01/03/2024 CLINICAL DATA:  Head trauma, minor (Age >= 65y).  Fall EXAM: CT HEAD WITHOUT CONTRAST TECHNIQUE: Contiguous axial images were obtained from the base of the skull through the vertex without intravenous contrast. RADIATION DOSE REDUCTION: This exam was performed according to the departmental dose-optimization program which includes automated exposure control, adjustment of the mA and/or kV according to patient size and/or use of iterative reconstruction technique. COMPARISON:  12/10/2023 FINDINGS: Brain: Cavum septum pellucidum. Moderate global parenchymal volume loss is stable since prior examination. Mild periventricular white  matter changes are present likely reflecting the sequela of small vessel ischemia. 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 No abnormal intra or extra-axial mass lesion or fluid collection. No abnormal mass effect or midline shift. No evidence of acute intracranial hemorrhage or infarct. Ventricular size is normal. Cerebellum unremarkable. Vascular: No asymmetric hyperdense vasculature at the skull base. Skull: Intact Sinuses/Orbits: Paranasal sinuses are clear. Orbits are unremarkable. Other: Dense fluid opacification of the right mastoid air cells and middle ear cavity. No associated osseous erosion or coalescence. Left mastoid air cells are clear. Small fluid noted within the left middle ear cavity. IMPRESSION: 1. No acute intracranial abnormality. No calvarial fracture. 2. Stable moderate global parenchymal volume loss and remote right frontal periventricular white matter infarct. 3. Dense fluid opacification of the right mastoid air cells and middle ear cavity. No associated osseous erosion or coalescence. Small fluid noted within the left middle ear cavity. Electronically Signed   By: Worthy Heads Dunn.D.   On: 01/03/2024  01:16   NM Pulmonary Perfusion Result Date: 01/02/2024 CLINICAL DATA:  Hypoxia, tachycardia, metastatic cancer EXAM: NUCLEAR MEDICINE PERFUSION LUNG SCAN TECHNIQUE: Perfusion images were obtained in multiple projections after intravenous injection of radiopharmaceutical. Ventilation scans intentionally deferred if perfusion scan and chest x-ray adequate for interpretation during COVID 19 epidemic. RADIOPHARMACEUTICALS:  4.4 mCi Tc-17m MAA IV COMPARISON:  Chest radiograph 01/01/2024 FINDINGS: Perfusion imaging demonstrates no segmental or subsegmental perfusion abnormalities. IMPRESSION: No segmental or subsegmental perfusion abnormalities to suggest pulmonary embolism. Electronically Signed   By: Rozell Cornet Dunn.D.   On: 01/02/2024 14:29   DG CHEST PORT 1 VIEW Result Date: 01/01/2024 CLINICAL DATA:  Hypoxia EXAM: PORTABLE CHEST 1 VIEW COMPARISON:  Chest radiograph dated 12/28/2023 FINDINGS: Normal lung volumes. Patchy right upper and left basilar opacities. No pleural effusion or pneumothorax. Similar mildly enlarged cardiomediastinal silhouette. Median sternotomy wires are nondisplaced. Bilateral rib fractures, better evaluated on prior CT. IMPRESSION: Patchy right upper and left basilar opacities, which may represent atelectasis, aspiration, or pneumonia. Electronically Signed   By: Limin  Xu Dunn.D.   On: 01/01/2024 20:27   NM PET Image Initial (PI) Skull Base To Thigh Result Date: 01/01/2024 CLINICAL DATA:  Bone lesions. EXAM: NUCLEAR MEDICINE PET SKULL BASE TO THIGH TECHNIQUE: 7.08 mCi F-18 FDG was injected intravenously. Full-ring PET imaging was performed from the skull base to thigh after the radiotracer. CT data was obtained and used for attenuation correction and anatomic localization. Fasting blood glucose: 187 mg/dl COMPARISON:  Remote PET-CT scan 2017 CTA chest 12/10/2023. Spine MRI 12/07/2023. Abdomen pelvis CT 04/13/2023. FINDINGS: Mediastinal blood pool activity: SUV max 2.6 Liver activity:  SUV max 2.7 NECK: No specific abnormal radiotracer uptake seen in the neck including along lymph node change of the submandibular, posterior triangle or internal jugular regions. Near symmetric uptake of the visualized intracranial compartment. Incidental CT findings: the parotid glands show some fatty atrophy of the left compared to right. The submandibular glands are preserved. Thyroid  gland is slightly small. Diffuse vascular calcifications. There is some streak artifact related to the patient's dental hardware. Opacification of the right mastoid air cells is seen. Please correlate with symptoms. The paranasal sinuses included in the imaging field are preserved. CHEST: No specific abnormal radiotracer uptake seen above blood pool in the axillary regions, hilum or mediastinum. There is some patchy left upper lobe uptake corresponding to some parenchymal opacities with reticulonodular changes. Uptake is maximum SUV of 2.6. Favor infectious or inflammatory process. Please correlate clinical presentation. Incidental  CT findings: Status post median sternotomy. Heart is slightly enlarged. No significant pericardial effusion. Thoracic aorta is normal course and caliber with diffuse vascular calcifications. Calcifications as well along the great vessels. Slightly patulous esophagus. Breathing motion. No pneumothorax or effusion. Minimal patchy opacities in the right lung. ABDOMEN/PELVIS: There is physiologic distribution radiotracer along the parenchymal organs, bowel and renal collecting systems. No abnormal nodal uptake. Incidental CT findings: Stomach is underdistended but there is slight wall/fold thickening. Please correlate with symptoms. Small bowel is nondilated. Large bowel is normal course and caliber. There is moderate colonic stool. Surgical changes along the mid sigmoid colon. Dependent stones in the nondilated gallbladder. Bilateral renal atrophy identified, right worse than left. Pole posterior right-sided  renal cyst identified measuring 3.5 cm in diameter. This has Hounsfield units of close to 0. Preserved contour to the urinary bladder. Extensive vascular calcifications seen throughout the abdomen including the aorta and branch vessels. There is also a femoral to femoral artery bypass graft. Multiple vascular stents. Please correlate with clinical history. No obvious free air or free fluid. SKELETON: There are multiple scattered areas of mild asymmetric uptake along the osseous structures. Example in the right upper hemi sacrum has maximum SUV value of 2.4. Small sclerotic area in this location on image 133. Focus upper left hemi pelvis at the iliac bone has area of maximum SUV 2.3 and also area of sclerosis on image 122. There are scattered foci along other areas of the pelvis, numerous vertebral bodies as well as along ribs. Neither example includes L3 vertebral body with maximum SUV 4.3. There are areas uptake along some rib fractures including the posterolateral aspect of the right seventh rib and lateral aspect of the left sixth rib. Please see recent MRI of the spine. Incidental CT findings: Scattered degenerative changes. Compression at T9-10. IMPRESSION: Multifocal mild areas of abnormal uptake along the skeleton including several ribs, vertebral bodies and scattered along the pelvis. Some of these lesions are associated with areas of bony sclerosis. Bilateral rib fractures are seen. Please correlate with clinical history. The right-sided rib fracture appears more subacute in the left more acute. No soft tissue areas of abnormal uptake including along lymph nodes are in the spleen. Slight uptake along patchy left lung opacities and towards the lingula. Please correlate for etiology including infectious or inflammatory process and recommend follow-up. Extensive vascular calcifications identified. Postop chest. 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 Multiple stents. Cardiomegaly. Gallstones. Slight wall  thickening of the stomach. Please correlate with any symptoms. Bilateral renal atrophy. Electronically Signed   By: Adrianna Horde Dunn.D.   On: 01/01/2024 10:13   CT Chest Wo Contrast Result Date: 12/28/2023 CLINICAL DATA:  Chest pain, nonspecific ongoing chest pain, post pna. * Tracking Code: BO * EXAM: CT CHEST WITHOUT CONTRAST TECHNIQUE: Multidetector CT imaging of the chest was performed following the standard protocol without IV contrast. RADIATION DOSE REDUCTION: This exam was performed according to the departmental dose-optimization program which includes automated exposure control, adjustment of the mA and/or kV according to patient size and/or use of iterative reconstruction technique. COMPARISON:  PET-CT scan from 12/27/2023 and CT angiography chest from 12/10/2023. FINDINGS: Cardiovascular: Normal cardiac size. No pericardial effusion. No aortic aneurysm. There are coronary artery calcifications, in keeping with coronary artery disease. There are also moderate peripheral atherosclerotic vascular calcifications of thoracic aorta and its major branches. 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 Mediastinum/Nodes: Visualized thyroid  gland appears grossly unremarkable. No solid / cystic mediastinal masses. The esophagus is nondistended  precluding optimal assessment. There are few mildly prominent mediastinal lymph nodes, which do not meet the size criteria for lymphadenopathy and appear grossly similar to the prior study, favoring benign etiology. No axillary lymphadenopathy by size criteria. Evaluation of bilateral hila is limited due to lack on intravenous contrast: however, no large hilar lymphadenopathy identified. Lungs/Pleura: The central tracheo-bronchial tree is patent. There are patchy, heterogeneous, irregular opacities predominantly involving the left upper lobe, which are new since the prior study. There are also new heterogeneous scattered opacities in the left lung lower lobe. There is  continued interval improvement in the opacities in the right upper lobe. Several areas of subsegmental atelectasis throughout bilateral lungs are essentially unchanged in the interim. No new dense consolidation, lung collapse or pleural effusion. Evaluation for lung nodule is limited due to background lung parenchymal changes. Upper Abdomen: Small volume dependent calcified gallstones noted without imaging signs of acute cholecystitis. Remaining visualized upper abdominal viscera within normal limits. Musculoskeletal: The visualized soft tissues of the chest wall are grossly unremarkable. Redemonstration of mixed sclerotic/lytic lesions in the T7 and T9 vertebral body, essentially similar to the prior study. There is pathological fracture with mild compression deformity of T9 vertebral body. There is also ill-defined lytic lesion in the posterolateral right fourth rib (series 2, image 54), unchanged. Subacute/healing pathological fracture of right seventh rib also appears essentially unchanged. There is acute minimally displaced fracture of the anteromedial left sixth rib (series 2, image 79, which was present on the prior exam as well. There are mild multilevel degenerative changes in the visualized spine. IMPRESSION: 1. There are new heterogeneous opacities in the left lung, as described above, which are nonspecific and may represent multifocal pneumonia. There is continued interval improvement in the opacities in the right upper lobe. 2. Redemonstration of osseous metastases, essentially similar to the prior study, including pathological fracture of T9 vertebral body and right seventh rib. 3. There is minimally displaced fracture of the anteromedial left sixth rib, present since the prior study. No metastatic lesion seen at the fracture site. 4. Multiple other nonacute observations, as described above. Aortic Atherosclerosis (ICD10-I70.0). Electronically Signed   By: Beula Brunswick Dunn.D.   On: 12/28/2023 08:42    DG Chest 2 View Result Date: 12/28/2023 CLINICAL DATA:  Shortness of breath. EXAM: CHEST - 2 VIEW COMPARISON:  12/10/2023 FINDINGS: Low lung volumes. Stable asymmetric elevation left hemidiaphragm. Streaky density at the left base is similar to prior compatible with atelectasis or scarring. Posterior displaced fracture noted in a mid right rib, likely ribs 7. There is some bony callus at the fracture site suggesting nonacute injury. No pneumothorax or substantial pleural effusion. Telemetry leads overlie the chest. IMPRESSION: 1. Low lung volumes with streaky density at the left base compatible with atelectasis or scarring. 2. Nonacute posterior displaced fracture in a mid right rib, likely rib 7. Electronically Signed   By: Donnal Fusi Dunn.D.   On: 12/28/2023 07:27   IR Fluoro Guide Ndl Plmt / BX Result Date: 12/27/2023 INDICATION: Enhancing abnormality of the T9 vertebral body on recent MRI of the thoracic spine. EXAM: BONE BIOPSY AT T9 MEDICATIONS: None. ANESTHESIA/SEDATION: Moderate (conscious) sedation was employed during this procedure. A total of Versed  2 mg and Fentanyl  50 mcg was administered intravenously by the radiology nurse. Total intra-service moderate Sedation Time: 26 minutes. The patient's level of consciousness and vital signs were monitored continuously by radiology nursing throughout the procedure under my direct supervision. Fluoro time: 6 minutes 48 seconds.  272  mGy. COMPLICATIONS: None immediate. PROCEDURE: Informed written consent was obtained from the patient after a thorough discussion of the procedural risks, benefits and alternatives. All questions were addressed. Maximal Sterile Barrier Technique was utilized including caps, mask, sterile gowns, sterile gloves, sterile drape, hand hygiene and skin antiseptic. A timeout was performed prior to the initiation of the procedure. With the patient prone on the fluoroscopic table, the thoracolumbar region was prepped and draped in the  usual manner. The T9 vertebral body was identified on the AP and lateral projection by counting from the sacral region. The skin overlying the right pedicle was then infiltrated with 0.25% bupivacaine , and extended into the underlying paraspinal musculature. Using biplane intermittent fluoroscopy, a 13 gauge Cook spinal needle was advanced into the posterior aspect of T9. Through this, 2 passes were made with a 16 gauge core biopsy needle. Two core samples were obtained and sent in formalin for pathologic evaluation. Hemostasis was achieved at the skin entry site. Patient was then returned to short-stay for recovery. IMPRESSION: Status post fluoroscopic guided core biopsy at T9 vertebral body as described above without event. Electronically Signed   By: Luellen Sages Dunn.D.   On: 12/27/2023 09:02

## 2024-01-22 ENCOUNTER — Ambulatory Visit

## 2024-01-22 ENCOUNTER — Ambulatory Visit: Payer: No Typology Code available for payment source

## 2024-01-22 ENCOUNTER — Ambulatory Visit (INDEPENDENT_AMBULATORY_CARE_PROVIDER_SITE_OTHER)

## 2024-01-22 DIAGNOSIS — I714 Abdominal aortic aneurysm, without rupture, unspecified: Secondary | ICD-10-CM | POA: Diagnosis not present

## 2024-01-22 DIAGNOSIS — I739 Peripheral vascular disease, unspecified: Secondary | ICD-10-CM

## 2024-01-22 DIAGNOSIS — N289 Disorder of kidney and ureter, unspecified: Secondary | ICD-10-CM

## 2024-01-22 DIAGNOSIS — I771 Stricture of artery: Secondary | ICD-10-CM

## 2024-01-22 LAB — VAS US ABI WITH/WO TBI: Right ABI: 0.7

## 2024-01-22 LAB — SURGICAL PATHOLOGY

## 2024-01-22 NOTE — Progress Notes (Signed)
 [facility] [address], [place] [address]   Clinic Day:  01/23/2024  Referring physician: Clinic, [name]  Patient Care Team: Clinic, [name] as PCP - General [name] [name], MD as PCP - Cardiology (Cardiology) Swaziland, Peter M, MD as Consulting Physician (Cardiology) Avanell Leigh, MD as Consulting Physician (Cardiology) Center, [facility]   ASSESSMENT & PLAN:   Assessment:  1.  Metastatic (lytic) bone lesions: - Presentation: Right anterior chest wall pain since March 2025 - CT chest (12/07/2023): New fracture through the superior endplate of T9 vertebral body at the site of mixed sclerotic and lucent lesion consistent with pathologic fracture.  Patchy bilateral upper lobe nodular airspace disease infectious/inflammatory etiology. - MRI thoracic spine (12/07/2023): Pathologic compression fracture at T9-10 with abnormal enhancement extending into the expanded right pedicle.  Multiple enhancing lesions throughout the thoracic spine.  Multiple rib lesions bilaterally without pathologic rib fracture. - MRI lumbar spine (12/07/2023): Multiple enhancing lesions throughout the lumbar spine, sacrum and iliac bones.  No pathological fracture. - CT angiogram chest (12/09/2020): No pulmonary embolism.  Multifocal pulmonary infiltrates, slightly improved in the left upper lobe.  Multiple lytic lesions within the T7 and T9 vertebral bodies with pathologic fracture of the T9 vertebral body and right 4th and 7th ribs and pathologic fracture of the right seventh rib. - No B symptoms.  Had prior nonmelanoma skin cancer removed on the face. - 12/07/2023: PSA-3.89, SPEP: M spike not observed, immunofixation unremarkable.  Tumor markers including CEA, CA 19-9 and AFP were normal.  FLC ratio was normal with elevated kappa and lambda light chains. - PET scan (12/27/2023): Multifocal mild areas of abnormal uptake along the skeleton including several ribs,  vertebral bodies and scattered along the pelvis.  Some of these lesions are associated with areas of bony sclerosis.  No soft tissue areas of abnormal uptake including lymph nodes and spleen.  L3 vertebral body with maximum SUV 4.3.  Left hemipelvis at the iliac bone maximum SUV 2.3.  Right upper hemisacrum SUV 2.4. - T9 bone biopsy (12/26/2023): Scant fragment of bone with evidence of bony remodeling and minimal marrow space with fibrosis and spindled cells interpreted as myofibroblastic.  An attempt to rule out metastatic disease and possible plasma cell neoplasm, both CK AE1/AE3 and MUM1 were performed and negative. - L3 body biopsy (01/15/2024): Normocellular bone marrow with active hematopoiesis and evidence of normal trilineage maturation.  No metastatic carcinoma identified by CK AE1/AE3 IHC (was performed on decalcified tissue).   2. Social/Family History: -Lives at home with wife and is on disability.  Gait is unsteady due to neuropathy in the feet from diabetes.  He is able to do ADLs and IADLs.  Quit tobacco use 20 years ago of 2 ppd for 40 years. Agent orange exposure in [place]. No asbestos exposure. -Brother had esophageal cancer. No other family history of cancer.   Plan:  1.  Metastatic (lytic) bone lesions: - Recent admission from 12/28/2023 through 01/03/2024 with lobar pneumonia. - Last admission from 01/13/2024 through 01/17/2024 with multifocal pneumonia. - I have reviewed records of those admissions. - I have also discussed results of PET scan which did not show any visceral metastatic disease.  Mild uptake in the bones which were biopsied. - We discussed biopsy results of both the T9 and L3, the latter of which was done on 01/15/2024 showing normocellular bone marrow with no metastatic carcinoma identified although limited by IHC performed on decalcified tissue. -  At this time, there is no evidence of malignancy on repeat biopsies.  We will hold off on any further biopsies.  Will  consider repeating another scan in 3 to 6 months based on his clinical condition. - We have sent serum quantitative immunoglobulins because of recurrent pneumonia.  Will follow-up on it.  2.  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 - Latest CBC on 01/17/2024: Hemoglobin 8.1.  Creatinine 2.08. - We have done labs today: Ferritin is low at 38, saturation 5.  Folic acid and B12 was normal.  MMA, copper are pending. - We discussed parenteral iron  therapy with INFeD 1 g IV x 1.  We discussed rare chance of anaphylactic reaction.  Will give premedications. - Will see him back in 6 weeks with repeat CBC, ferritin and iron  panel.   No orders of the defined types were placed in this encounter.     Nadeen Augusta Teague,acting as a Neurosurgeon for Paulett Boros, MD.,have documented all relevant documentation on the behalf of Paulett Boros, MD,as directed by  Paulett Boros, MD while in the presence of Paulett Boros, MD.  I, Paulett Boros MD, have reviewed the above documentation for accuracy and completeness, and I agree with the above.    Paulett Boros, MD   6/11/202510:07 AM  CHIEF COMPLAINT/PURPOSE OF CONSULT:   Diagnosis: Lytic bone lesions   Cancer Staging  No matching staging information was found for the patient.    Prior Therapy: None  Current Therapy: Under workup   HISTORY OF PRESENT ILLNESS:   Oncology History   No history exists.      [name] is a 78 y.o. male presenting to clinic today for evaluation of lytic bone lesions at the request of Justina Oman, MD.  Patient has a medical history of TIA, DM type II, peripheral neuropathy, CAD s/p CABG in 2017 and percutaneous coronary angioplasty, peripheral vascular disease, CKD stage 3, hyperlipidemia, GERD, and hypertension.  He was admitted to the hospital from 12/07/23 to 12/10/23 for generalized weakness, fatigue, and SOB. Imaging was concerning  for pneumonia and IV antibiotics were given x 3 days. MRI of the thoracic spine incidentally found: Pathologic compression fracture at T9-10 with abnormal enhancement extending into an expanded right pedicle. Multiple enhancing lesions throughout the thoracic spine as described compatible with metastatic disease or multiple myeloma. Multiple rib lesions bilaterally without a pathologic rib fracture. Central T2 hyperintensity in the cord at the T2-3 level is nonspecific but may represent a small focus of myelomalacia. No significant central canal stenosis. Facet hypertrophy contributes to right greater than left foraminal stenosis from T8-9 through T11-12. Moderate right foraminal stenosis is also present at T7-8.  MRI Lumbar spine also showed: Multiple enhancing lesions throughout the lumbar spine, visualized sacrum and iliac bones compatible with metastatic disease or multiple myeloma. No pathologic fractures. Abnormal signal in the proximal left iliac artery raises concern for occlusion or slow flow. 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 Mild left subarticular and moderate left foraminal stenosis at L2-3. Mild subarticular and moderate foraminal stenosis bilaterally at L3-4, right greater than left. Moderate to severe central canal stenosis at L4-5 with right greater than left subarticular narrowing. Moderate foraminal stenosis bilaterally at L4-5 is worse on the left. Shallow right paramedian disc protrusion at L5-S1 potentially contacts the traversing right L5 nerve roots. Moderate right foraminal stenosis at L5-S1.  [name] was briefly admitted to the hospital from 12/10/23 to 12/11/23 after a fall due to  syncope, in which he hit the back of his head. Imaging showed no acute injuries and CTA was negative for PE. He was found to be orthostatically hypotensive and given IV fluids, as well as IV antibiotics.   Multiple Myeloma Panel fron 12/07/23 showed elevated Alpha 2 globulin  at 1.3 and  M-spike was not observed. Urine protein electrophoresis from 12/07/23 was normal.   Today, he states that he is doing well overall. His appetite level is at 100%. His energy level is at 50%. He is accompanied by family members.   [name] states symptoms began after his first hospitalization in February 2025 for pneumonia. His pneumonia has not resolved since. Chue reports he is susceptible to pneumonia due to a medical condition he cannot name. He has regular follow-up with his PCP in [place], Dr. Sharion Davidson.  He notes right lower chest wall pain that began in March 2025, after helping his wife put on compression socks. He believes he may have pulled a muscle doing so and pain has improved since its onset. Ademide has a prescription of tramadol  at home but has not required any for pain.  His wife states Nisaiah had back pain during his hospitalization from 12/07/23 to 12/10/23. His daughter reports he was lifting weights strenuously 1 week prior to this and may be attributable to back pain.   Elhadj reports a dry cough with occasional phlegm that is worsened at night. He notes he is not able to walk straight and has peripheral neuropathy in the form of numbness in the feet. He reports a normal appetite, though he states he does not drink an adequate amount of water . He denies any weakness in the extremities, fevers, night sweats, or unintentional weight loss.   Mirza has a history of skin cancer, not melanoma, and deniesn any other history of cancer. He follows up with Dr. Luna Salinas for a nodule that [facility] states has not grown in years.   His daughter states he has conjunctival hemorrhage on the left eye and was told while he was hospitalized this may be due to the IV heparin  he received.   INTERVAL HISTORY:   [name] is a 78 y.o. male presenting to the clinic today for follow-up of lytic bone lesions. He was last seen by me on 12/18/23 in consultation.  Since his last visit, he has been admitted to the  hospital twice. His first admission was from 12/28/23 to 01/03/24 for lobar pneumonia, treated with cefepime  and azithromycin . He also had coffee ground emesis with EGD showing no evidence of active or old bleeding. His second admission was from 01/13/24 to 01/17/24 for multifocal pneumonia, treated with IV antibiotic while hospitalized and discharged with oral cefdinir  and Flagyl .  Of note, he underwent IR bone biopsy on 01/15/24.   Today, he states that he is doing well overall. His appetite level is at 100%. His energy level is at 50%.   PAST MEDICAL HISTORY:   Past Medical History: 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   x 5   Hypercholesteremia    Hypertension    Neuropathy    Peripheral vascular disease (HCC)  s/p R-L FEM-FEM BYPASS   Pneumonia    Tachycardia    after CABG, pt on medicine for this    Surgical History: Past Surgical History:  Procedure Laterality Date   ABDOMINAL AORTOGRAM Bilateral 09/19/2016   Procedure: iliac;  Surgeon: Margherita Shell, MD;  [facility]: [facility];  Service: Cardiovascular;  Laterality: Bilateral;   ABDOMINAL AORTOGRAM W/LOWER EXTREMITY N/A 09/22/2021   Procedure: ABDOMINAL AORTOGRAM W/LOWER EXTREMITY;  Surgeon: Young Hensen, MD;  Location: [facility];  Service: Cardiovascular;  Laterality: N/A;   ABDOMINAL AORTOGRAM W/LOWER EXTREMITY N/A 10/05/2022   Procedure: ABDOMINAL AORTOGRAM W/LOWER EXTREMITY;  Surgeon: Young Hensen, MD;  Location: [facility];  Service: Cardiovascular;  Laterality: N/A;   BIOPSY  05/16/2023   Procedure: BIOPSY;  Surgeon: Suzette Espy, MD;  [facility];  Service: Endoscopy;;   CARDIAC CATHETERIZATION  2003   with stent   CARDIAC CATHETERIZATION N/A 10/11/2015   Procedure: Left Heart Cath and Coronary Angiography;  Surgeon: Peter M Swaziland, MD;  Location: [facility] INVASIVE CV LAB;  Service: Cardiovascular;  Laterality: N/A;   COLON RESECTION N/A 12/13/2017   Procedure: EXPLORATORY LAPAROTOMY, SIGMOID COLECTOMY WITH COLOSTOMY;  Surgeon: Dareen Ebbing, MD;  Location: [facility] OR;  Service: General;  Laterality: N/A;   COLONOSCOPY N/A 09/22/2013   Procedure: COLONOSCOPY;  Surgeon: Suzette Espy, MD;  [facility]: [facility];  Service: Endoscopy;  Laterality: N/A;  9:30 AM   COLONOSCOPY N/A 02/19/2018   Procedure: COLONOSCOPY;  Surgeon: Suzette Espy, MD;  [facility];  Service: Endoscopy;  Laterality: N/A;  2:00pm   COLONOSCOPY WITH PROPOFOL  N/A 05/16/2023   Procedure: COLONOSCOPY WITH PROPOFOL ;  Surgeon: Suzette Espy, MD;  [facility];  Service: Endoscopy;  Laterality: N/A;  730am, asa 3   COLOSTOMY N/A 12/13/2017   Procedure: COLOSTOMY;  Surgeon: Dareen Ebbing, MD;  Location: [facility] OR;  Service: General;  Laterality: N/A;   COLOSTOMY REVERSAL N/A 03/26/2018   Procedure: COLOSTOMY REVERSAL;  Surgeon: Dareen Ebbing, MD;  [facility]: [facility] OR;  Service: General;  Laterality: N/A;   CORONARY ARTERY BYPASS GRAFT N/A 10/18/2015   Procedure: CORONARY ARTERY BYPASS GRAFTING (CABG) x  five, using left internal mammary artery and right leg greater saphenous vein harvested endoscopically;  Surgeon: Zelphia Higashi, MD;  Location: [facility] OR;  Service: Open Heart Surgery;  Laterality: N/A;   ENDARTERECTOMY FEMORAL Right 08/09/2016   Procedure: ENDARTERECTOMY FEMORAL WITH VEIN PATCH ANGIOPLASTY;  Surgeon: Margherita Shell, MD;  Location: [facility] OR;  Service: Vascular;  Laterality: Right;   ESOPHAGOGASTRODUODENOSCOPY N/A 10/24/2017   Dr. Riley Cheadle: hiatal hernia   ESOPHAGOGASTRODUODENOSCOPY N/A 01/01/2024   Procedure: EGD  (ESOPHAGOGASTRODUODENOSCOPY);  Surgeon: Hargis Lias, MD;  [facility];  Service: Endoscopy;  Laterality: N/A;   ESOPHAGOGASTRODUODENOSCOPY (EGD) WITH PROPOFOL  N/A 05/16/2023   Procedure: ESOPHAGOGASTRODUODENOSCOPY (EGD) WITH PROPOFOL ;  Surgeon: Suzette Espy, MD;  [facility];  Service: Endoscopy;  Laterality: N/A;   FEMORAL-FEMORAL BYPASS GRAFT Bilateral 08/09/2016   Procedure: REVISION BYPASS GRAFT RIGHT FEMORAL-LEFT FEMORAL ARTERY;  Surgeon: Margherita Shell, MD;  Location: [facility] OR;  Service: Vascular;  Laterality: Bilateral;   FEMORAL-POPLITEAL BYPASS GRAFT     INTRAMEDULLARY (IM) NAIL INTERTROCHANTERIC Left 04/03/2021   Procedure: LEFT  HIP INTRAMEDULLARY (IM) NAIL INTERTROCHANTRIC;  Surgeon: Arnie Lao, MD;  Location: [facility];  Service: Orthopedics;  Laterality: Left;   IR FLUORO GUIDED NEEDLE PLC ASPIRATION/INJECTION LOC  12/26/2023   IR IVC FILTER RETRIEVAL / S&I Dan Dun GUID/MOD SED  01/15/2024   MALONEY DILATION N/A 05/16/2023   Procedure: Londa Rival DILATION;  Surgeon: Suzette Espy, MD;  [facility];  Service: Endoscopy;  Laterality: N/A;   PERIPHERAL VASCULAR BALLOON ANGIOPLASTY Right 09/22/2021   Procedure: PERIPHERAL VASCULAR BALLOON ANGIOPLASTY;  Surgeon: Young Hensen, MD;  [facility];  Service: Cardiovascular;  Laterality: Right;   PERIPHERAL VASCULAR CATHETERIZATION N/A 05/08/2016   Procedure: Lower Extremity Angiography;  Surgeon: Avanell Leigh, MD;  Location: [facility] INVASIVE CV LAB;  Service: Cardiovascular;  Laterality: N/A;   PERIPHERAL VASCULAR INTERVENTION Right 09/19/2016   Procedure: Peripheral Vascular Intervention;  Surgeon: Margherita Shell, MD;  [facility];  Service: Cardiovascular;  Laterality: Right;  ext iliac stent   PERIPHERAL VASCULAR INTERVENTION Right 09/22/2021   Procedure: PERIPHERAL VASCULAR INTERVENTION;  Surgeon: Young Hensen, MD;  [facility];   Service: Cardiovascular;  Laterality: Right;   POLYPECTOMY  02/19/2018   Procedure: POLYPECTOMY;  Surgeon: Suzette Espy, MD;  [facility];  Service: Endoscopy;;   PROCTOSCOPY  10/24/2017   Procedure: PROCTOSCOPY;  Surgeon: Suzette Espy, MD;  [facility];  Service: Endoscopy;;   TEE WITHOUT CARDIOVERSION N/A 10/18/2015   Procedure: TRANSESOPHAGEAL ECHOCARDIOGRAM (TEE);  Surgeon: Zelphia Higashi, MD;  Location: [facility] OR;  Service: Open Heart Surgery;  Laterality: N/A;    Social History: Social History   Socioeconomic History   Marital status: Married    Spouse name: Not on file   Number of children: Not on file   Years of education: Not on file   Highest education level: Not on file  Occupational History   Not on file  Tobacco Use   Smoking status: Former    Current packs/day: 0.00    Average packs/day: 2.0 packs/day for 30.0 years (60.0 ttl pk-yrs)    Types: Cigarettes    Start date: 08/14/1970    Quit date: 08/14/2000    Years since quitting: 23.4   Smokeless tobacco: Never  Vaping Use   Vaping status: Never Used  Substance and Sexual Activity   Alcohol  use: No   Drug use: No   Sexual activity: Not Currently  Other Topics Concern   Not on file  Social History Narrative   Not on file   Social Drivers of Health   Financial Resource Strain: Low Risk  (10/08/2023)   Received from [facility]   Overall Financial Resource Strain (CARDIA)    Difficulty of Paying Living Expenses: Not very hard  Food Insecurity: No Food Insecurity (01/13/2024)   Hunger Vital Sign    Worried About Running Out of Food in the Last Year: Never true    Ran Out of Food in the Last Year: Never true  Recent Concern: Food Insecurity - Food Insecurity Present (12/18/2023)   Hunger Vital Sign    Worried About Running Out of Food in the Last Year: Sometimes true    Ran Out of Food in the Last Year: Sometimes true  Transportation Needs: No Transportation Needs (01/13/2024)    PRAPARE - Administrator, Civil Service (Medical): No    Lack of Transportation (Non-Medical): No  Physical Activity: Not on file  Stress: Not on file  Social Connections: Socially Integrated (01/13/2024)   Social Connection and Isolation Panel [NHANES]    Frequency of Communication with Friends and Family: More than three  times a week    Frequency of Social Gatherings with Friends and Family: Three times a week    Attends Religious Services: More than 4 times per year    Active Member of Clubs or Organizations: Yes    Attends Banker Meetings: 1 to 4 times per year    Marital Status: Married  Catering manager Violence: Not At Risk (01/13/2024)   Humiliation, Afraid, Rape, and Kick questionnaire    Fear of Current or Ex-Partner: No    Emotionally Abused: No    Physically Abused: No    Sexually Abused: No    Family History: Family History  Problem Relation Age of Onset   Heart attack Mother    Stroke Mother    Colon cancer Brother        late 22s    Current Medications:  Current Outpatient Medications:    albuterol  (VENTOLIN  HFA) 108 (90 Base) MCG/ACT inhaler, Inhale 2 puffs into the lungs every 6 (six) hours as needed for wheezing or shortness of breath., Disp: 8 g, Rfl: 2   amLODipine  (NORVASC ) 10 MG tablet, Take 10 mg by mouth in the morning., Disp: , Rfl:    aspirin  EC 81 MG tablet, Take 1 tablet (81 mg total) by mouth daily with breakfast., Disp: 30 tablet, Rfl: 2   clopidogrel  (PLAVIX ) 75 MG tablet, Take 75 mg by mouth in the morning., Disp: , Rfl:    dextromethorphan -guaiFENesin  (MUCINEX  DM) 30-600 MG 12hr tablet, Take 1 tablet by mouth 2 (two) times daily., Disp: 20 tablet, Rfl: 0   Fluticasone-Umeclidin-Vilant (TRELEGY ELLIPTA) 200-62.5-25 MCG/ACT AEPB, Inhale 2 puffs into the lungs daily., Disp: , Rfl:    gabapentin  (NEURONTIN ) 300 MG capsule, Take 1 capsule (300 mg total) by mouth 2 (two) times daily., Disp: 60 capsule, Rfl: 0   insulin  glargine  (LANTUS ) 100 UNIT/ML injection, Inject 0.1 mLs (10 Units total) into the skin at bedtime. (Patient taking differently: Inject 20 Units into the skin at bedtime.), Disp: , Rfl:    losartan  (COZAAR ) 50 MG tablet, Take 50 mg by mouth daily., Disp: , Rfl:    metoprolol  tartrate (LOPRESSOR ) 50 MG tablet, Take 25 mg by mouth 2 (two) times daily. , Disp: , Rfl:    pantoprazole  (PROTONIX ) 40 MG tablet, Take 1 tablet (40 mg total) by mouth 2 (two) times daily., Disp: 60 tablet, Rfl: 0   vitamin B-12 (CYANOCOBALAMIN ) 1000 MCG tablet, Take 1,000 mcg by mouth in the morning., Disp: , Rfl:    Allergies: Allergies  Allergen Reactions   Acarbose Diarrhea    REVIEW OF SYSTEMS:   Review of Systems  Constitutional:  Negative for chills, fatigue and fever.  HENT:   Negative for lump/mass, mouth sores, nosebleeds, sore throat and trouble swallowing.   Eyes:  Negative for eye problems.  Respiratory:  Negative for cough and shortness of breath.   Cardiovascular:  Negative for chest pain, leg swelling and palpitations.  Gastrointestinal:  Negative for abdominal pain, constipation, diarrhea, nausea and vomiting.  Genitourinary:  Negative for bladder incontinence, difficulty urinating, dysuria, frequency, hematuria and nocturia.   Musculoskeletal:  Negative for arthralgias, back pain, flank pain, myalgias and neck pain.  Skin:  Negative for itching and rash.  Neurological:  Negative for dizziness, headaches and numbness.  Hematological:  Does not bruise/bleed easily.  Psychiatric/Behavioral:  Negative for depression, sleep disturbance and suicidal ideas. The patient is not nervous/anxious.   All other systems reviewed and are negative.    VITALS:  Blood pressure (!) 167/56, pulse 70, temperature 98.3 F (36.8 C), temperature source Tympanic, resp. rate 18, weight 138 lb 7.2 oz (62.8 kg), SpO2 100%.  Wt Readings from Last 3 Encounters:  01/23/24 138 lb 7.2 oz (62.8 kg)  01/13/24 140 lb 10.5 oz (63.8 kg)   01/01/24 141 lb (64 kg)    Body mass index is 21.68 kg/m.  Performance status (ECOG): 1 - Symptomatic but completely ambulatory  PHYSICAL EXAM:   Physical Exam Vitals and nursing note reviewed. Exam conducted with a chaperone present.  Constitutional:      Appearance: Normal appearance.  Cardiovascular:     Rate and Rhythm: Normal rate and regular rhythm.     Pulses: Normal pulses.     Heart sounds: Normal heart sounds.  Pulmonary:     Effort: Pulmonary effort is normal.     Breath sounds: Normal breath sounds.  Abdominal:     Palpations: Abdomen is soft. There is no hepatomegaly, splenomegaly or mass.     Tenderness: There is no abdominal tenderness.  Musculoskeletal:     Right lower leg: No edema.     Left lower leg: No edema.  Lymphadenopathy:     Cervical: No cervical adenopathy.     Right cervical: No superficial, deep or posterior cervical adenopathy.    Left cervical: No superficial, deep or posterior cervical adenopathy.     Upper Body:     Right upper body: No supraclavicular or axillary adenopathy.     Left upper body: No supraclavicular or axillary adenopathy.  Neurological:     General: No focal deficit present.     Mental Status: He is alert and oriented to person, place, and time.  Psychiatric:        Mood and Affect: Mood normal.        Behavior: Behavior normal.    LABS:   CBC    Component Value Date/Time   WBC 9.5 01/17/2024 0430   RBC 3.05 (L) 01/17/2024 0430   HGB 8.1 (L) 01/17/2024 0430   HGB 10.8 (L) 02/20/2023 1253   HCT 25.6 (L) 01/17/2024 0430   HCT 34.6 (L) 02/20/2023 1253   PLT 316 01/17/2024 0430   PLT 456 (H) 02/20/2023 1253   MCV 83.9 01/17/2024 0430   MCV 81 02/20/2023 1253   MCH 26.6 01/17/2024 0430   MCHC 31.6 01/17/2024 0430   RDW 17.6 (H) 01/17/2024 0430   RDW 15.4 02/20/2023 1253   LYMPHSABS 0.7 01/13/2024 1551   LYMPHSABS 1.3 02/20/2023 1253   MONOABS 2.0 (H) 01/13/2024 1551   EOSABS 0.0 01/13/2024 1551   EOSABS 0.3  02/20/2023 1253   BASOSABS 0.1 01/13/2024 1551   BASOSABS 0.1 02/20/2023 1253    CMP    Component Value Date/Time   NA 134 (L) 01/17/2024 0430   NA 139 02/20/2023 1253   K 4.8 01/17/2024 0430   CL 102 01/17/2024 0430   CO2 22 01/17/2024 0430   GLUCOSE 132 (H) 01/17/2024 0430   BUN 30 (H) 01/17/2024 0430   BUN 24 02/20/2023 1253   CREATININE 2.08 (H) 01/17/2024 0430   CREATININE 1.28 (H) 04/25/2016 1038   CALCIUM  8.2 (L) 01/17/2024 0430   PROT 5.9 (L) 01/13/2024 1551   PROT 6.8 02/20/2023 1253   ALBUMIN  2.8 (L) 01/13/2024 1551   ALBUMIN  4.3 02/20/2023 1253   AST 12 (L) 01/13/2024 1551   ALT 13 01/13/2024 1551   ALKPHOS 81 01/13/2024 1551   BILITOT 0.8 01/13/2024 1551   BILITOT  0.4 02/20/2023 1253   GFRNONAA 32 (L) 01/17/2024 0430   GFRAA >60 05/22/2019 4782     Lab Results  Component Value Date   CEA1 3.6 12/18/2023   /  CEA  Date Value Ref Range Status  12/18/2023 3.6 0.0 - 4.7 ng/mL Final    Comment:    (NOTE)                             Nonsmokers          <3.9                             Smokers             <5.6 [employer] Electrochemiluminescence Immunoassay (ECLIA) Values obtained with different assay methods or kits cannot be used interchangeably.  Results cannot be interpreted as absolute evidence of the presence or absence of malignant disease. Performed At: [facility] [address] [place], [place] [id number] Pearlean Botts MD VH:[id number]    No results found for: PSA1 Lab Results  Component Value Date   UXL244 17 12/18/2023   No results found for: WNU272  Lab Results  Component Value Date   TOTALPROTELP 7.0 12/07/2023   No results found for: TIBC, FERRITIN, IRONPCTSAT Lab Results  Component Value Date   LDH 162 12/18/2023     STUDIES:   VAS US  AORTA/IVC/ILIACS Result Date: 01/22/2024 ABDOMINAL AORTA STUDY Patient Name:  [name]  Date of Exam:   01/22/2024 Medical Rec #: [id number]     Accession #:     [id number] Date of Birth: [birth year]     Patient Gender: M Patient Age:   30 years Exam Location:  [address] Procedure:      VAS US  AORTA/IVC/ILIACS Referring Phys: Jimmye Moulds --------------------------------------------------------------------------------  Indications: Follow up SMA stenosis, right CIA/EIA stents, right to left fem-fem              bypass graft. Risk Factors: Hypertension, hyperlipidemia, coronary artery disease. Vascular Interventions: 09/22/21: Right CIA and EIA angioplasty with CIA stent.                         08/09/16: Right iliofemoral endarterectomy.                         Bovine pericardial patch angioplasty, right common                         femoral artery.                         Redo right limb of right to left femoral-femoral bypass                         graft. Limitations: Patient discomfort.  Comparison Study: 03/28/23: SMA 540/183 cm/s                   07/10/23: Patent right CIA and EIA stents with elevated                   velocities in the EIA in the > 50% range. Patent right to left                   fem-  fem BPG with no stenosis Performing Technologist: Helon Lobos RVT  Examination Guidelines: A complete evaluation includes B-mode imaging, spectral Doppler, color Doppler, and power Doppler as needed of all accessible portions of each vessel. Bilateral testing is considered an integral part of a complete examination. Limited examinations for reoccurring indications may be performed as noted.  Abdominal Aorta Findings: +-------------+-------+----------+----------+----------+--------+--------+ Location     AP (cm)Trans (cm)PSV (cm/s)Waveform  ThrombusComments +-------------+-------+----------+----------+----------+--------+--------+ RT CIA Prox                   176       biphasic                   +-------------+-------+----------+----------+----------+--------+--------+ RT CIA Mid                    289       biphasic                    +-------------+-------+----------+----------+----------+--------+--------+ RT CIA Distal                 315       monophasic                 +-------------+-------+----------+----------+----------+--------+--------+ RT EIA Prox                   508       biphasic                   +-------------+-------+----------+----------+----------+--------+--------+ RT EIA Mid                    325       biphasic                   +-------------+-------+----------+----------+----------+--------+--------+ RT EIA Distal                 303       biphasic                   +-------------+-------+----------+----------+----------+--------+--------+ SMA proximal: 520/146 cm/s mid: 114/23 cm/s distal: 74/23 cm/s. Right Graft #1: +---------------------+--------+---------------+----------+--------+ Right to left fem-femPSV cm/sStenosis       Waveform  Comments +---------------------+--------+---------------+----------+--------+ Inflow               209     50-74% stenosisbiphasic           +---------------------+--------+---------------+----------+--------+ Prox Anastomosis     168                    biphasic           +---------------------+--------+---------------+----------+--------+ Proximal Graft       79                     monophasic         +---------------------+--------+---------------+----------+--------+ Mid Graft            53                     biphasic           +---------------------+--------+---------------+----------+--------+ Distal Graft         56                     biphasic           +---------------------+--------+---------------+----------+--------+ Distal Anastomosis   73  biphasic           +---------------------+--------+---------------+----------+--------+ Outflow              316     50-74% stenosisbiphasic           +---------------------+--------+---------------+----------+--------+   Summary: SMA: > 75%  proximal SMA stenosis. Stenosis: Velocities throughout the right CIA and EIA are in the >50% range with the highest velocity at the proximal EIA. Right to left fem-fem graft: Patent graft with 50-74% stenosis (outflow > inflow). *See table(s) above for measurements and observations.  Electronically signed by Jimmye Moulds MD on 01/22/2024 at 12:56:22 PM.    Final    VAS US  ABI WITH/WO TBI Result Date: 01/22/2024  LOWER EXTREMITY DOPPLER STUDY Patient Name:  [name]  Date of Exam:   01/22/2024 Medical Rec #: [id number]     Accession #:    [id number] Date of Birth: 11/14/1945     Patient Gender: M Patient Age:   39 years Exam Location:  [address] Procedure:      VAS US  ABI WITH/WO TBI Referring Phys: Jimmye Moulds --------------------------------------------------------------------------------  Indications: Peripheral artery disease. High Risk Factors: Hypertension, hyperlipidemia, coronary artery disease. Other Factors:  Vascular Interventions: 09/22/21: Right CIA and EIA angioplasty with CIA stent.                         08/09/16: Right iliofemoral endarterectomy.                         Bovine pericardial patch angioplasty, right common                         femoral artery.                         Redo right limb of right to left femoral-femoral bypass                         graft. Performing Technologist: Helon Lobos RVT  Examination Guidelines: A complete evaluation includes at minimum, Doppler waveform signals and systolic blood pressure reading at the level of bilateral brachial, anterior tibial, and posterior tibial arteries, when vessel segments are accessible. Bilateral testing is considered an integral part of a complete examination. Photoelectric Plethysmograph (PPG) waveforms and toe systolic pressure readings are included as required and additional duplex testing as needed. Limited examinations for reoccurring indications may be performed as noted.  ABI Findings:  +---------+------------------+-----+-------------------+--------+ Right    Rt Pressure (mmHg)IndexWaveform           Comment  +---------+------------------+-----+-------------------+--------+ Brachial 188                                                +---------+------------------+-----+-------------------+--------+ ATA      105               0.56 monophasic                  +---------+------------------+-----+-------------------+--------+ PTA      132               0.70 dampened monophasic         +---------+------------------+-----+-------------------+--------+ Farrell Honey  0.28                             +---------+------------------+-----+-------------------+--------+ +---------+------------------+-----+-------------------+-------+ Left     Lt Pressure (mmHg)IndexWaveform           Comment +---------+------------------+-----+-------------------+-------+ Brachial 176                                               +---------+------------------+-----+-------------------+-------+ ATA      255               1.36 monophasic                 +---------+------------------+-----+-------------------+-------+ PTA      255               1.36 dampened monophasic        +---------+------------------+-----+-------------------+-------+ Great Toe34                0.18                            +---------+------------------+-----+-------------------+-------+ +-------+-----------+-----------+------------+------------+ ABI/TBIToday's ABIToday's TBIPrevious ABIPrevious TBI +-------+-----------+-----------+------------+------------+ Right  0.7        0.28       0.71        0.39         +-------+-----------+-----------+------------+------------+ Left   [place]         0.18       [place]          0.3          +-------+-----------+-----------+------------+------------+  Arterial wall calcification precludes accurate ankle pressures and ABIs. Previous ABI  on 07/10/23.  Summary: Right: Resting right ankle-brachial index indicates moderate right lower extremity arterial disease. The right toe-brachial index is abnormal. Left: Resting left ankle-brachial index indicates noncompressible left lower extremity arteries. The left toe-brachial index is abnormal. *See table(s) above for measurements and observations.  Electronically signed by Jimmye Moulds MD on 01/22/2024 at 12:42:42 PM.    Final    IR BONE BIOPSY W IMG GUIDE Result Date: 01/18/2024 INDICATION: Abnormal MRI of the lumbosacral spine suspicious of metastatic disease. EXAM: FLUOROSCOPIC GUIDED CORE BIOPSY AT L3 MEDICATIONS: None. ANESTHESIA/SEDATION: Moderate (conscious) sedation was employed during this procedure. A total of Versed  2 mg and Fentanyl  75 mcg was administered intravenously by the radiology nurse. Total intra-service moderate Sedation Time: 19 minutes. The patient's level of consciousness and vital signs were monitored continuously by radiology nursing throughout the procedure under my direct supervision. Fluoro time: 4 minutes 36 seconds.  325 mGy. COMPLICATIONS: None immediate. PROCEDURE: Informed written consent was obtained from the patient after a thorough discussion of the procedural risks, benefits and alternatives. All questions were addressed. Maximal Sterile Barrier Technique was utilized including caps, mask, sterile gowns, sterile gloves, sterile drape, hand hygiene and skin antiseptic. A timeout was performed prior to the initiation of the procedure. With the patient prone, the lumbosacral region was prepped and draped in the usual sterile fashion. The L3 vertebral body right pedicle was identified on AP and lateral projections. The overlying skin was then infiltrated with 0.25% bupivacaine  and overlying paraspinal musculature in the right pedicle. Using biplane intermittent fluoroscopy 2 passes were made with a 13 gauge Cook spinal needle and a 16 gauge core biopsy needle, and 1  pass  with an 11 gauge Jamshidi needle and Kyphon osteo drill. Approximately 8 cc of mostly bloody thick aspirated with a few chunks of core tissue was obtained and sent for pathologic analysis. Hemostasis was achieved at the skin entry site. The patient tolerated the procedure well. There were no acute complications. Patient was then returned in stable and good condition. IMPRESSION: Status post fluoroscopic guided core biopsies at L3 via a right transpedicular approach with the retrieval of core tissue as described above. Electronically Signed   By: Luellen Sages M.D.   On: 01/18/2024 08:26   DG Swallowing Func-Speech Pathology Result Date: 01/16/2024 Table formatting from the original result was not included. Modified Barium Swallow Study Patient Details Name: [name] MRN: [id number] Date of Birth: 06-28-1946 Today's Date: 01/16/2024 HPI/PMH: HPI: Pt is a 78 y.o. male with PMHx of PNA (2019, April 2025, and May 2025), Bell's Palsy (2019), previous esophogram 10/16/2017 was [facility] except for prominent CP bar. Daughter reports he was recently diagnosed with COPD (unable to verify in chart). Other medical hx includes coronary artery disease status post CABG, hypertension, diabetes mellitus type 2, hyperlipidemia, CKD stage IV, diverticulitis status post colostomy and subsequent takedown, atrial tachycardia, peripheral vascular disease, TIA. Pt recently in the hospital from 12/07/2023 - 12/10/2023 as well as 12/28/2023 - 01/03/2024 for treatment of PNA. Chest x-ray 01/13/24: Increased patchy opacities at the left mid to upper lung zone.  Similar streaky opacities in the right upper lung and slightly increased streaky right basilar opacities. These findings could reflect multifocal pneumonia, aspiration, or edema. Clinical Impression: Pt presents with oropharyngeal function that is [facility] with no observed aspiration across consistencies. Oral phase was timely and unremarkable. Flash penetration into laryngeal vestibule  during the swallow observed with thin liquids during consecutive sips administered via cup and straw. Slightly reduced base of tongue retraction leading to trace BOT residue. Slightly diminished pharyngeal stripping wave resulting in trace pharyngeal residue in the valleculae, which cleared with subsequent reflexive swallow. Hyolaryngeal excusion was intact. Esophageal sweep was limited but unremarkable. Recommend continue regular textures with thin liquids with safe swallow precautions of small bites/sips, slow rate, alternate solids and liquids, upright for all PO and for at least 30 minute after PO. Factors that may increase risk of adverse event in presence of aspiration Roderick Civatte & Jessy Morocco 2021): Factors that may increase risk of adverse event in presence of aspiration Roderick Civatte & Jessy Morocco 2021): Poor general health and/or compromised immunity; Respiratory or GI disease Recommendations/Plan: Swallowing Evaluation Recommendations Swallowing Evaluation Recommendations Recommendations: PO diet Liquid Administration via: Cup; Straw Medication Administration: Whole meds with liquid Supervision: Patient able to self-feed Swallowing strategies  : Slow rate; Small bites/sips; Follow solids with liquids Postural changes: Position pt fully upright for meals; Stay upright 30-60 min after meals Oral care recommendations: Oral care BID (2x/day) Treatment Plan Treatment Plan Treatment recommendations: No treatment recommended at this time Follow-up recommendations: No SLP follow up Functional status assessment: Patient has had a recent decline in their functional status and demonstrates the ability to make significant improvements in function in a reasonable and predictable amount of time. Recommendations Recommendations for follow up therapy are one component of a multi-disciplinary discharge planning process, led by the attending physician.  Recommendations may be updated based on patient status, additional functional criteria  and insurance authorization. Assessment: Orofacial Exam: Orofacial Exam Oral Cavity: Oral Hygiene: WFL Oral Cavity - Dentition: Missing dentition (partials on top (not present for study)) Orofacial Anatomy: WFL Oral Motor/Sensory Function: WFL Anatomy:  Anatomy: Other (Comment) (hx of CP bar) Boluses Administered: Boluses Administered Boluses Administered: Thin liquids (Level 0); Mildly thick liquids (Level 2, nectar thick); Moderately thick liquids (Level 3, honey thick); Puree; Solid (Barium pill)  Oral Impairment Domain: Oral Impairment Domain Lip Closure: No labial escape Tongue control during bolus hold: Cohesive bolus between tongue to palatal seal Bolus preparation/mastication: Timely and efficient chewing and mashing Bolus transport/lingual motion: Brisk tongue motion Oral residue: Trace residue lining oral structures Location of oral residue : Tongue (base of tongue) Initiation of pharyngeal swallow : Pyriform sinuses; Posterior angle of the ramus Pharyngeal Impairment Domain: Pharyngeal Impairment Domain Soft palate elevation: No bolus between soft palate (SP)/pharyngeal wall (PW) Laryngeal elevation: Complete superior movement of thyroid  cartilage with complete approximation of arytenoids to epiglottic petiole Anterior hyoid excursion: Partial anterior movement Epiglottic movement: Complete inversion Laryngeal vestibule closure: Incomplete, narrow column air/contrast in laryngeal vestibule Pharyngeal stripping wave : Present - diminished Pharyngeal contraction (A/P view only): N/A Pharyngoesophageal segment opening: Complete distension and complete duration, no obstruction of flow Tongue base retraction: Trace column of contrast or air between tongue base and PPW Pharyngeal residue: Trace residue within or on pharyngeal structures Location of pharyngeal residue: Valleculae; Tongue base  Esophageal Impairment Domain: Esophageal Impairment Domain Esophageal clearance upright position: Complete clearance,  esophageal coating (Limited view with sweep) Pill: Pill Consistency administered: Thin liquids (Level 0) Penetration/Aspiration Scale Score: Penetration/Aspiration Scale Score 1.  Material does not enter airway: Pill; Solid; Puree; Moderately thick liquids (Level 3, honey thick); Mildly thick liquids (Level 2, nectar thick); Thin liquids (Level 0) 2.  Material enters airway, remains ABOVE vocal cords then ejected out: Thin liquids (Level 0) Compensatory Strategies: No data recorded  General Information: Caregiver present: No  Diet Prior to this Study: Regular; Thin liquids (Level 0)   Temperature : Normal   Respiratory Status: WFL   Supplemental O2: None (Room air)   History of Recent Intubation: No  Behavior/Cognition: Alert; Cooperative; Pleasant mood Self-Feeding Abilities: Able to self-feed Baseline vocal quality/speech: Normal Volitional Cough: Able to elicit Volitional Swallow: Able to elicit No data recorded Pain: Pain Assessment Pain Assessment: No/denies pain End of Session: Start Time:SLP Start Time (ACUTE ONLY): 1425 Stop Time: SLP Stop Time (ACUTE ONLY): 1505 Time Calculation:SLP Time Calculation (min) (ACUTE ONLY): 40 min Charges: SLP Evaluations $ SLP Speech Visit: 1 Visit SLP Evaluations $MBS Swallow: 1 Procedure SLP visit diagnosis: SLP Visit Diagnosis: Dysphagia, unspecified (R13.10) Past Medical History: Past Medical History: Diagnosis Date  Anemia   CAD S/P percutaneous coronary angioplasty 1998  PCI TO CX  CKD (chronic kidney disease) stage 3, GFR 30-59 ml/min (HCC)   Diabetes mellitus without complication (HCC)   Diverticulitis 08/16/2017  hospitalized with diverticulitis/sepsis  GERD (gastroesophageal reflux disease)   occassionally  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  Hypercholesteremia   Hypertension   Neuropathy   Peripheral vascular disease (HCC)   s/p R-L FEM-FEM BYPASS  Pneumonia   Tachycardia   after CABG, pt on medicine for this Past Surgical History: Past Surgical History: Procedure Laterality  Date  ABDOMINAL AORTOGRAM Bilateral 09/19/2016  Procedure: iliac;  Surgeon: Margherita Shell, MD;  Location: [facility];  Service: Cardiovascular;  Laterality: Bilateral;  ABDOMINAL AORTOGRAM W/LOWER EXTREMITY N/A 09/22/2021  Procedure: ABDOMINAL AORTOGRAM W/LOWER EXTREMITY;  Surgeon: Young Hensen, MD;  Location: [facility];  Service: Cardiovascular;  Laterality: N/A;  ABDOMINAL AORTOGRAM W/LOWER EXTREMITY N/A 10/05/2022  Procedure: ABDOMINAL AORTOGRAM W/LOWER EXTREMITY;  Surgeon: Fulton Job,  Marine Sia, MD;  Location: [facility];  Service: Cardiovascular;  Laterality: N/A;  BIOPSY  05/16/2023  Procedure: BIOPSY;  Surgeon: Suzette Espy, MD;  [facility];  Service: Endoscopy;;  CARDIAC CATHETERIZATION  2003  with stent  CARDIAC CATHETERIZATION N/A 10/11/2015  Procedure: Left Heart Cath and Coronary Angiography;  Surgeon: Peter M Swaziland, MD;  Location: [facility]ASIVE CV LAB;  Service: Cardiovascular;  Laterality: N/A;  COLON RESECTION N/A 12/13/2017  Procedure: EXPLORATORY LAPAROTOMY, SIGMOID COLECTOMY WITH COLOSTOMY;  Surgeon: Dareen Ebbing, MD;  Location: [facility];  Service: General;  Laterality: N/A;  COLONOSCOPY N/A 09/22/2013  Procedure: COLONOSCOPY;  Surgeon: Suzette Espy, MD;  [facility];  Service: Endoscopy;  Laterality: N/A;  9:30 AM  COLONOSCOPY N/A 02/19/2018  Procedure: COLONOSCOPY;  Surgeon: Suzette Espy, MD;  [facility];  Service: Endoscopy;  Laterality: N/A;  2:00pm  COLONOSCOPY WITH PROPOFOL  N/A 05/16/2023  Procedure: COLONOSCOPY WITH PROPOFOL ;  Surgeon: Suzette Espy, MD;  [facility];  Service: Endoscopy;  Laterality: N/A;  730am, asa 3  COLOSTOMY N/A 12/13/2017  Procedure: COLOSTOMY;  Surgeon: Dareen Ebbing, MD;  Location: [facility] OR;  Service: General;  Laterality: N/A;  COLOSTOMY REVERSAL N/A 03/26/2018  Procedure: COLOSTOMY REVERSAL;  Surgeon: Dareen Ebbing, MD;  Location: [facility];  Service: General;  Laterality: N/A;   CORONARY ARTERY BYPASS GRAFT N/A 10/18/2015  Procedure: CORONARY ARTERY BYPASS GRAFTING (CABG) x  five, using left internal mammary artery and right leg greater saphenous vein harvested endoscopically;  Surgeon: Zelphia Higashi, MD;  Location: [facility];  Service: Open Heart Surgery;  Laterality: N/A;  ENDARTERECTOMY FEMORAL Right 08/09/2016  Procedure: ENDARTERECTOMY FEMORAL WITH VEIN PATCH ANGIOPLASTY;  Surgeon: Margherita Shell, MD;  Location: [facility];  Service: Vascular;  Laterality: Right;  ESOPHAGOGASTRODUODENOSCOPY N/A 10/24/2017  Dr. Riley Cheadle: hiatal hernia  ESOPHAGOGASTRODUODENOSCOPY N/A 01/01/2024  Procedure: EGD (ESOPHAGOGASTRODUODENOSCOPY);  Surgeon: Hargis Lias, MD;  [facility];  Service: Endoscopy;  Laterality: N/A;  ESOPHAGOGASTRODUODENOSCOPY (EGD) WITH PROPOFOL  N/A 05/16/2023  Procedure: ESOPHAGOGASTRODUODENOSCOPY (EGD) WITH PROPOFOL ;  Surgeon: Suzette Espy, MD;  [facility];  Service: Endoscopy;  Laterality: N/A;  FEMORAL-FEMORAL BYPASS GRAFT Bilateral 08/09/2016  Procedure: REVISION BYPASS GRAFT RIGHT FEMORAL-LEFT FEMORAL ARTERY;  Surgeon: Margherita Shell, MD;  Location: [facility] OR;  Service: Vascular;  Laterality: Bilateral;  FEMORAL-POPLITEAL BYPASS GRAFT    INTRAMEDULLARY (IM) NAIL INTERTROCHANTERIC Left 04/03/2021  Procedure: LEFT  HIP INTRAMEDULLARY (IM) NAIL INTERTROCHANTRIC;  Surgeon: Arnie Lao, MD;  Location: [facility];  Service: Orthopedics;  Laterality: Left;  IR FLUORO GUIDED NEEDLE PLC ASPIRATION/INJECTION LOC  12/26/2023  MALONEY DILATION N/A 05/16/2023  Procedure: MALONEY DILATION;  Surgeon: Suzette Espy, MD;  [facility];  Service: Endoscopy;  Laterality: N/A;  PERIPHERAL VASCULAR BALLOON ANGIOPLASTY Right 09/22/2021  Procedure: PERIPHERAL VASCULAR BALLOON ANGIOPLASTY;  Surgeon: Young Hensen, MD;  [facility];  Service: Cardiovascular;  Laterality: Right;  PERIPHERAL VASCULAR CATHETERIZATION N/A 05/08/2016   Procedure: Lower Extremity Angiography;  Surgeon: Avanell Leigh, MD;  Location: [facility] INVASIVE CV LAB;  Service: Cardiovascular;  Laterality: N/A;  PERIPHERAL VASCULAR INTERVENTION Right 09/19/2016  Procedure: Peripheral Vascular Intervention;  Surgeon: Margherita Shell, MD;  [facility];  Service: Cardiovascular;  Laterality: Right;  ext iliac stent  PERIPHERAL VASCULAR INTERVENTION Right 09/22/2021  Procedure: PERIPHERAL VASCULAR INTERVENTION;  Surgeon: Young Hensen, MD;  [facility]: [facility];  Service:  Cardiovascular;  Laterality: Right;  POLYPECTOMY  02/19/2018  Procedure: POLYPECTOMY;  Surgeon: Suzette Espy, MD;  [facility];  Service: Endoscopy;;  PROCTOSCOPY  10/24/2017  Procedure: PROCTOSCOPY;  Surgeon: Suzette Espy, MD;  [facility];  Service: Endoscopy;;  TEE WITHOUT CARDIOVERSION N/A 10/18/2015  Procedure: TRANSESOPHAGEAL ECHOCARDIOGRAM (TEE);  Surgeon: Zelphia Higashi, MD;  Location: [facility] OR;  Service: Open Heart Surgery;  Laterality: N/A; Caretha Chapel, MA CCC-SLP Speech-Language Pathologist 01/16/2024, 3:53 PM  DG Chest Port 1 View Result Date: 01/13/2024 CLINICAL DATA:  Fever.  Decreased lung sounds. EXAM: PORTABLE CHEST 1 VIEW COMPARISON:  01/01/2024. FINDINGS: Stable cardiomediastinal silhouette. Prior median sternotomy and CABG. Increased patchy opacities at the left mid to upper lung zone. Similar streaky opacities in the right upper lung and slightly increased streaky right basilar opacities. No large pleural effusion. No pneumothorax. Known bilateral rib fractures, better evaluated on the prior CT. IMPRESSION: Increased patchy opacities at the left mid to upper lung zone. Similar streaky opacities in the right upper lung and slightly increased streaky right basilar opacities. These findings could reflect multifocal pneumonia, aspiration, or edema. Electronically Signed   By: Mannie Seek M.D.   On: 01/13/2024 16:24   CT HEAD  WO CONTRAST ( ) Result Date: 01/03/2024 CLINICAL DATA:  Head trauma, minor (Age >= 65y).  Fall EXAM: CT HEAD WITHOUT CONTRAST TECHNIQUE: Contiguous axial images were obtained from the base of the skull through the vertex without intravenous contrast. RADIATION DOSE REDUCTION: This exam was performed according to the departmental dose-optimization program which includes automated exposure control, adjustment of the mA and/or kV according to patient size and/or use of iterative reconstruction technique. COMPARISON:  12/10/2023 FINDINGS: Brain: Cavum septum pellucidum. Moderate global parenchymal volume loss is stable since prior examination. Mild periventricular white matter changes are present likely reflecting the sequela of small vessel ischemia. 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 No abnormal intra or extra-axial mass lesion or fluid collection. No abnormal mass effect or midline shift. No evidence of acute intracranial hemorrhage or infarct. Ventricular size is normal. Cerebellum unremarkable. Vascular: No asymmetric hyperdense vasculature at the skull base. Skull: Intact Sinuses/Orbits: Paranasal sinuses are clear. Orbits are unremarkable. Other: Dense fluid opacification of the right mastoid air cells and middle ear cavity. No associated osseous erosion or coalescence. Left mastoid air cells are clear. Small fluid noted within the left middle ear cavity. IMPRESSION: 1. No acute intracranial abnormality. No calvarial fracture. 2. Stable moderate global parenchymal volume loss and remote right frontal periventricular white matter infarct. 3. Dense fluid opacification of the right mastoid air cells and middle ear cavity. No associated osseous erosion or coalescence. Small fluid noted within the left middle ear cavity. Electronically Signed   By: Worthy Heads M.D.   On: 01/03/2024 01:16   NM Pulmonary Perfusion Result Date: 01/02/2024 CLINICAL DATA:  Hypoxia, tachycardia,  metastatic cancer EXAM: NUCLEAR MEDICINE PERFUSION LUNG SCAN TECHNIQUE: Perfusion images were obtained in multiple projections after intravenous injection of radiopharmaceutical. Ventilation scans intentionally deferred if perfusion scan and chest x-ray adequate for interpretation during COVID 19 epidemic. RADIOPHARMACEUTICALS:  4.4 mCi Tc-77m MAA IV COMPARISON:  Chest radiograph 01/01/2024 FINDINGS: Perfusion imaging demonstrates no segmental or subsegmental perfusion abnormalities. IMPRESSION: No segmental or subsegmental perfusion abnormalities to suggest pulmonary embolism. Electronically Signed   By: Rozell Cornet M.D.   On: 01/02/2024 14:29   DG CHEST PORT 1 VIEW Result Date: 01/01/2024 CLINICAL DATA:  Hypoxia EXAM: PORTABLE CHEST  1 VIEW COMPARISON:  Chest radiograph dated 12/28/2023 FINDINGS: Normal lung volumes. Patchy right upper and left basilar opacities. No pleural effusion or pneumothorax. Similar mildly enlarged cardiomediastinal silhouette. Median sternotomy wires are nondisplaced. Bilateral rib fractures, better evaluated on prior CT. IMPRESSION: Patchy right upper and left basilar opacities, which may represent atelectasis, aspiration, or pneumonia. Electronically Signed   By: Limin  Xu M.D.   On: 01/01/2024 20:27   NM PET Image Initial (PI) Skull Base To Thigh Result Date: 01/01/2024 CLINICAL DATA:  Bone lesions. EXAM: NUCLEAR MEDICINE PET SKULL BASE TO THIGH TECHNIQUE: 7.08 mCi F-18 FDG was injected intravenously. Full-ring PET imaging was performed from the skull base to thigh after the radiotracer. CT data was obtained and used for attenuation correction and anatomic localization. Fasting blood glucose: 187 mg/dl COMPARISON:  Remote PET-CT scan 2017 CTA chest 12/10/2023. Spine MRI 12/07/2023. Abdomen pelvis CT 04/13/2023. FINDINGS: Mediastinal blood pool activity: SUV max 2.6 Liver activity: SUV max 2.7 NECK: No specific abnormal radiotracer uptake seen in the neck including along lymph  node change of the submandibular, posterior triangle or internal jugular regions. Near symmetric uptake of the visualized intracranial compartment. Incidental CT findings: the parotid glands show some fatty atrophy of the left compared to right. The submandibular glands are preserved. Thyroid  gland is slightly small. Diffuse vascular calcifications. There is some streak artifact related to the patient's dental hardware. Opacification of the right mastoid air cells is seen. Please correlate with symptoms. The paranasal sinuses included in the imaging field are preserved. CHEST: No specific abnormal radiotracer uptake seen above blood pool in the axillary regions, hilum or mediastinum. There is some patchy left upper lobe uptake corresponding to some parenchymal opacities with reticulonodular changes. Uptake is maximum SUV of 2.6. Favor infectious or inflammatory process. Please correlate clinical presentation. Incidental CT findings: Status post median sternotomy. Heart is slightly enlarged. No significant pericardial effusion. Thoracic aorta is normal course and caliber with diffuse vascular calcifications. Calcifications as well along the great vessels. Slightly patulous esophagus. Breathing motion. No pneumothorax or effusion. Minimal patchy opacities in the right lung. ABDOMEN/PELVIS: There is physiologic distribution radiotracer along the parenchymal organs, bowel and renal collecting systems. No abnormal nodal uptake. Incidental CT findings: Stomach is underdistended but there is slight wall/fold thickening. Please correlate with symptoms. Small bowel is nondilated. Large bowel is normal course and caliber. There is moderate colonic stool. Surgical changes along the mid sigmoid colon. Dependent stones in the nondilated gallbladder. Bilateral renal atrophy identified, right worse than left. Pole posterior right-sided renal cyst identified measuring 3.5 cm in diameter. This has Hounsfield units of close to 0.  Preserved contour to the urinary bladder. Extensive vascular calcifications seen throughout the abdomen including the aorta and branch vessels. There is also a femoral to femoral artery bypass graft. Multiple vascular stents. Please correlate with clinical history. No obvious free air or free fluid. SKELETON: There are multiple scattered areas of mild asymmetric uptake along the osseous structures. Example in the right upper hemi sacrum has maximum SUV value of 2.4. Small sclerotic area in this location on image 133. Focus upper left hemi pelvis at the iliac bone has area of maximum SUV 2.3 and also area of sclerosis on image 122. There are scattered foci along other areas of the pelvis, numerous vertebral bodies as well as along ribs. Neither example includes L3 vertebral body with maximum SUV 4.3. There are areas uptake along some rib fractures including the posterolateral aspect of the right seventh rib and  lateral aspect of the left sixth rib. Please see recent MRI of the spine. Incidental CT findings: Scattered degenerative changes. Compression at T9-10. IMPRESSION: Multifocal mild areas of abnormal uptake along the skeleton including several ribs, vertebral bodies and scattered along the pelvis. Some of these lesions are associated with areas of bony sclerosis. Bilateral rib fractures are seen. Please correlate with clinical history. The right-sided rib fracture appears more subacute in the left more acute. No soft tissue areas of abnormal uptake including along lymph nodes are in the spleen. Slight uptake along patchy left lung opacities and towards the lingula. Please correlate for etiology including infectious or inflammatory process and recommend follow-up. Extensive vascular calcifications identified. Postop chest. 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 Multiple stents. Cardiomegaly. Gallstones. Slight wall thickening of the stomach. Please correlate with any symptoms. Bilateral renal atrophy.  Electronically Signed   By: Adrianna Horde M.D.   On: 01/01/2024 10:13   CT Chest Wo Contrast Result Date: 12/28/2023 CLINICAL DATA:  Chest pain, nonspecific ongoing chest pain, post pna. * Tracking Code: BO * EXAM: CT CHEST WITHOUT CONTRAST TECHNIQUE: Multidetector CT imaging of the chest was performed following the standard protocol without IV contrast. RADIATION DOSE REDUCTION: This exam was performed according to the departmental dose-optimization program which includes automated exposure control, adjustment of the mA and/or kV according to patient size and/or use of iterative reconstruction technique. COMPARISON:  PET-CT scan from 12/27/2023 and CT angiography chest from 12/10/2023. FINDINGS: Cardiovascular: Normal cardiac size. No pericardial effusion. No aortic aneurysm. There are coronary artery calcifications, in keeping with coronary artery disease. There are also moderate peripheral atherosclerotic vascular calcifications of thoracic aorta and its major branches. 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 Mediastinum/Nodes: Visualized thyroid  gland appears grossly unremarkable. No solid / cystic mediastinal masses. The esophagus is nondistended precluding optimal assessment. There are few mildly prominent mediastinal lymph nodes, which do not meet the size criteria for lymphadenopathy and appear grossly similar to the prior study, favoring benign etiology. No axillary lymphadenopathy by size criteria. Evaluation of bilateral hila is limited due to lack on intravenous contrast: however, no large hilar lymphadenopathy identified. Lungs/Pleura: The central tracheo-bronchial tree is patent. There are patchy, heterogeneous, irregular opacities predominantly involving the left upper lobe, which are new since the prior study. There are also new heterogeneous scattered opacities in the left lung lower lobe. There is continued interval improvement in the opacities in the right upper lobe. Several areas of  subsegmental atelectasis throughout bilateral lungs are essentially unchanged in the interim. No new dense consolidation, lung collapse or pleural effusion. Evaluation for lung nodule is limited due to background lung parenchymal changes. Upper Abdomen: Small volume dependent calcified gallstones noted without imaging signs of acute cholecystitis. Remaining visualized upper abdominal viscera within normal limits. Musculoskeletal: The visualized soft tissues of the chest wall are grossly unremarkable. Redemonstration of mixed sclerotic/lytic lesions in the T7 and T9 vertebral body, essentially similar to the prior study. There is pathological fracture with mild compression deformity of T9 vertebral body. There is also ill-defined lytic lesion in the posterolateral right fourth rib (series 2, image 54), unchanged. Subacute/healing pathological fracture of right seventh rib also appears essentially unchanged. There is acute minimally displaced fracture of the anteromedial left sixth rib (series 2, image 79, which was present on the prior exam as well. There are mild multilevel degenerative changes in the visualized spine. IMPRESSION: 1. There are new heterogeneous opacities in the left lung, as described above, which are nonspecific and may  represent multifocal pneumonia. There is continued interval improvement in the opacities in the right upper lobe. 2. Redemonstration of osseous metastases, essentially similar to the prior study, including pathological fracture of T9 vertebral body and right seventh rib. 3. There is minimally displaced fracture of the anteromedial left sixth rib, present since the prior study. No metastatic lesion seen at the fracture site. 4. Multiple other nonacute observations, as described above. Aortic Atherosclerosis (ICD10-I70.0). Electronically Signed   By: Beula Brunswick M.D.   On: 12/28/2023 08:42   DG Chest 2 View Result Date: 12/28/2023 CLINICAL DATA:  Shortness of breath. EXAM: CHEST  - 2 VIEW COMPARISON:  12/10/2023 FINDINGS: Low lung volumes. Stable asymmetric elevation left hemidiaphragm. Streaky density at the left base is similar to prior compatible with atelectasis or scarring. Posterior displaced fracture noted in a mid right rib, likely ribs 7. There is some bony callus at the fracture site suggesting nonacute injury. No pneumothorax or substantial pleural effusion. Telemetry leads overlie the chest. IMPRESSION: 1. Low lung volumes with streaky density at the left base compatible with atelectasis or scarring. 2. Nonacute posterior displaced fracture in a mid right rib, likely rib 7. Electronically Signed   By: Donnal Fusi M.D.   On: 12/28/2023 07:27   IR Fluoro Guide Ndl Plmt / BX Result Date: 12/27/2023 INDICATION: Enhancing abnormality of the T9 vertebral body on recent MRI of the thoracic spine. EXAM: BONE BIOPSY AT T9 MEDICATIONS: None. ANESTHESIA/SEDATION: Moderate (conscious) sedation was employed during this procedure. A total of Versed  2 mg and Fentanyl  50 mcg was administered intravenously by the radiology nurse. Total intra-service moderate Sedation Time: 26 minutes. The patient's level of consciousness and vital signs were monitored continuously by radiology nursing throughout the procedure under my direct supervision. Fluoro time: 6 minutes 48 seconds.  272 mGy. COMPLICATIONS: None immediate. PROCEDURE: Informed written consent was obtained from the patient after a thorough discussion of the procedural risks, benefits and alternatives. All questions were addressed. Maximal Sterile Barrier Technique was utilized including caps, mask, sterile gowns, sterile gloves, sterile drape, hand hygiene and skin antiseptic. A timeout was performed prior to the initiation of the procedure. With the patient prone on the fluoroscopic table, the thoracolumbar region was prepped and draped in the usual manner. The T9 vertebral body was identified on the AP and lateral projection by  counting from the sacral region. The skin overlying the right pedicle was then infiltrated with 0.25% bupivacaine , and extended into the underlying paraspinal musculature. Using biplane intermittent fluoroscopy, a 13 gauge Cook spinal needle was advanced into the posterior aspect of T9. Through this, 2 passes were made with a 16 gauge core biopsy needle. Two core samples were obtained and sent in formalin for pathologic evaluation. Hemostasis was achieved at the skin entry site. Patient was then returned to short-stay for recovery. IMPRESSION: Status post fluoroscopic guided core biopsy at T9 vertebral body as described above without event. Electronically Signed   By: Luellen Sages M.D.   On: 12/27/2023 09:02

## 2024-01-23 ENCOUNTER — Other Ambulatory Visit: Payer: Self-pay | Admitting: *Deleted

## 2024-01-23 ENCOUNTER — Inpatient Hospital Stay

## 2024-01-23 ENCOUNTER — Inpatient Hospital Stay: Attending: Hematology | Admitting: Hematology

## 2024-01-23 VITALS — BP 158/59 | HR 70 | Temp 98.3°F | Resp 18 | Wt 138.4 lb

## 2024-01-23 DIAGNOSIS — D509 Iron deficiency anemia, unspecified: Secondary | ICD-10-CM | POA: Insufficient documentation

## 2024-01-23 DIAGNOSIS — C801 Malignant (primary) neoplasm, unspecified: Secondary | ICD-10-CM | POA: Insufficient documentation

## 2024-01-23 DIAGNOSIS — C7951 Secondary malignant neoplasm of bone: Secondary | ICD-10-CM | POA: Diagnosis present

## 2024-01-23 DIAGNOSIS — E1142 Type 2 diabetes mellitus with diabetic polyneuropathy: Secondary | ICD-10-CM | POA: Insufficient documentation

## 2024-01-23 DIAGNOSIS — E1122 Type 2 diabetes mellitus with diabetic chronic kidney disease: Secondary | ICD-10-CM | POA: Insufficient documentation

## 2024-01-23 DIAGNOSIS — D631 Anemia in chronic kidney disease: Secondary | ICD-10-CM | POA: Insufficient documentation

## 2024-01-23 DIAGNOSIS — N184 Chronic kidney disease, stage 4 (severe): Secondary | ICD-10-CM | POA: Diagnosis present

## 2024-01-23 DIAGNOSIS — D508 Other iron deficiency anemias: Secondary | ICD-10-CM | POA: Diagnosis not present

## 2024-01-23 DIAGNOSIS — D649 Anemia, unspecified: Secondary | ICD-10-CM

## 2024-01-23 LAB — CBC WITH DIFFERENTIAL/PLATELET
Abs Immature Granulocytes: 0.17 10*3/uL — ABNORMAL HIGH (ref 0.00–0.07)
Basophils Absolute: 0.1 10*3/uL (ref 0.0–0.1)
Basophils Relative: 1 %
Eosinophils Absolute: 0.3 10*3/uL (ref 0.0–0.5)
Eosinophils Relative: 3 %
HCT: 27.9 % — ABNORMAL LOW (ref 39.0–52.0)
Hemoglobin: 8.8 g/dL — ABNORMAL LOW (ref 13.0–17.0)
Immature Granulocytes: 2 %
Lymphocytes Relative: 9 %
Lymphs Abs: 1 10*3/uL (ref 0.7–4.0)
MCH: 25.4 pg — ABNORMAL LOW (ref 26.0–34.0)
MCHC: 31.5 g/dL (ref 30.0–36.0)
MCV: 80.6 fL (ref 80.0–100.0)
Monocytes Absolute: 1.1 10*3/uL — ABNORMAL HIGH (ref 0.1–1.0)
Monocytes Relative: 10 %
Neutro Abs: 8 10*3/uL — ABNORMAL HIGH (ref 1.7–7.7)
Neutrophils Relative %: 75 %
Platelets: 497 10*3/uL — ABNORMAL HIGH (ref 150–400)
RBC: 3.46 MIL/uL — ABNORMAL LOW (ref 4.22–5.81)
RDW: 17.4 % — ABNORMAL HIGH (ref 11.5–15.5)
WBC: 10.5 10*3/uL (ref 4.0–10.5)
nRBC: 0 % (ref 0.0–0.2)

## 2024-01-23 LAB — RETICULOCYTES
Immature Retic Fract: 22.9 % — ABNORMAL HIGH (ref 2.3–15.9)
RBC.: 3.5 MIL/uL — ABNORMAL LOW (ref 4.22–5.81)
Retic Count, Absolute: 69.7 10*3/uL (ref 19.0–186.0)
Retic Ct Pct: 2 % (ref 0.4–3.1)

## 2024-01-23 LAB — VITAMIN B12: Vitamin B-12: 964 pg/mL — ABNORMAL HIGH (ref 180–914)

## 2024-01-23 LAB — IRON AND TIBC
Iron: 14 ug/dL — ABNORMAL LOW (ref 45–182)
Saturation Ratios: 5 % — ABNORMAL LOW (ref 17.9–39.5)
TIBC: 275 ug/dL (ref 250–450)
UIBC: 261 ug/dL

## 2024-01-23 LAB — FERRITIN: Ferritin: 38 ng/mL (ref 24–336)

## 2024-01-23 LAB — FOLATE: Folate: 11.9 ng/mL (ref >5.9)

## 2024-01-23 LAB — LACTATE DEHYDROGENASE: LDH: 157 U/L (ref 98–192)

## 2024-01-23 NOTE — Patient Instructions (Signed)
 [facility] at [facility] Discharge Instructions   You were seen and examined today by Dr. Cheree Cords.  He reviewed the results of your biopsy results which did not show any evidence of cancer.    We will obtain additional blood work today since your blood was low while you were in the hospital.   Return as scheduled.    Thank you for choosing [facility] at [facility] to provide your oncology and hematology care.  To afford each patient quality time with our provider, please arrive at least 15 minutes before your scheduled appointment time.   If you have a lab appointment with the Cancer Center please come in thru the Main Entrance and check in at the main information desk.  You need to re-schedule your appointment should you arrive 10 or more minutes late.  We strive to give you quality time with our providers, and arriving late affects you and other patients whose appointments are after yours.  Also, if you no show three or more times for appointments you may be dismissed from the clinic at the providers discretion.     Again, thank you for choosing [facility].  Our hope is that these requests will decrease the amount of time that you wait before being seen by our physicians.       _____________________________________________________________  Should you have questions after your visit to [facility], please contact our office at [phone] and follow the prompts.  Our office hours are 8:00 a.m. and 4:30 p.m. Monday - Friday.  Please note that voicemails left after 4:00 p.m. may not be returned until the following business day.  We are closed weekends and major holidays.  You do have access to a nurse 24-7, just call the main number to the clinic [phone] and do not press any options, hold on the line and a nurse will answer the phone.    For prescription refill requests, have your pharmacy contact our office  and allow 72 hours.    Due to Covid, you will need to wear a mask upon entering the hospital. If you do not have a mask, a mask will be given to you at the Main Entrance upon arrival. For doctor visits, patients may have 1 support person age 73 or older with them. For treatment visits, patients can not have anyone with them due to social distancing guidelines and our immunocompromised population.

## 2024-01-26 ENCOUNTER — Other Ambulatory Visit: Payer: Self-pay

## 2024-01-26 ENCOUNTER — Emergency Department (HOSPITAL_COMMUNITY)

## 2024-01-26 ENCOUNTER — Inpatient Hospital Stay (HOSPITAL_COMMUNITY)
Admission: EM | Admit: 2024-01-26 | Discharge: 2024-01-29 | DRG: 092 | Disposition: A | Discharge status: Home / Self Care | Attending: Internal Medicine | Admitting: Internal Medicine

## 2024-01-26 DIAGNOSIS — G928 Other toxic encephalopathy: Principal | ICD-10-CM | POA: Diagnosis present

## 2024-01-26 DIAGNOSIS — Z794 Long term (current) use of insulin: Secondary | ICD-10-CM

## 2024-01-26 DIAGNOSIS — D72829 Elevated white blood cell count, unspecified: Secondary | ICD-10-CM | POA: Diagnosis present

## 2024-01-26 DIAGNOSIS — E119 Type 2 diabetes mellitus without complications: Secondary | ICD-10-CM

## 2024-01-26 DIAGNOSIS — Z800 Family history of malignant neoplasm of digestive organs: Secondary | ICD-10-CM

## 2024-01-26 DIAGNOSIS — Z823 Family history of stroke: Secondary | ICD-10-CM

## 2024-01-26 DIAGNOSIS — C7951 Secondary malignant neoplasm of bone: Secondary | ICD-10-CM | POA: Diagnosis present

## 2024-01-26 DIAGNOSIS — E1122 Type 2 diabetes mellitus with diabetic chronic kidney disease: Secondary | ICD-10-CM | POA: Diagnosis present

## 2024-01-26 DIAGNOSIS — R000 Tachycardia, unspecified: Secondary | ICD-10-CM | POA: Diagnosis present

## 2024-01-26 DIAGNOSIS — R680 Hypothermia, not associated with low environmental temperature: Secondary | ICD-10-CM | POA: Diagnosis present

## 2024-01-26 DIAGNOSIS — Z8673 Personal history of transient ischemic attack (TIA), and cerebral infarction without residual deficits: Secondary | ICD-10-CM

## 2024-01-26 DIAGNOSIS — N184 Chronic kidney disease, stage 4 (severe): Secondary | ICD-10-CM | POA: Diagnosis present

## 2024-01-26 DIAGNOSIS — G9340 Encephalopathy, unspecified: Secondary | ICD-10-CM | POA: Diagnosis not present

## 2024-01-26 DIAGNOSIS — Z933 Colostomy status: Secondary | ICD-10-CM

## 2024-01-26 DIAGNOSIS — Z7902 Long term (current) use of antithrombotics/antiplatelets: Secondary | ICD-10-CM

## 2024-01-26 DIAGNOSIS — Z9889 Other specified postprocedural states: Secondary | ICD-10-CM

## 2024-01-26 DIAGNOSIS — M898X9 Other specified disorders of bone, unspecified site: Secondary | ICD-10-CM | POA: Diagnosis present

## 2024-01-26 DIAGNOSIS — Z8249 Family history of ischemic heart disease and other diseases of the circulatory system: Secondary | ICD-10-CM

## 2024-01-26 DIAGNOSIS — M542 Cervicalgia: Secondary | ICD-10-CM | POA: Diagnosis present

## 2024-01-26 DIAGNOSIS — Z9861 Coronary angioplasty status: Secondary | ICD-10-CM

## 2024-01-26 DIAGNOSIS — Z888 Allergy status to other drugs, medicaments and biological substances status: Secondary | ICD-10-CM

## 2024-01-26 DIAGNOSIS — E1140 Type 2 diabetes mellitus with diabetic neuropathy, unspecified: Secondary | ICD-10-CM | POA: Diagnosis present

## 2024-01-26 DIAGNOSIS — G8929 Other chronic pain: Secondary | ICD-10-CM | POA: Diagnosis present

## 2024-01-26 DIAGNOSIS — I2510 Atherosclerotic heart disease of native coronary artery without angina pectoris: Secondary | ICD-10-CM | POA: Diagnosis present

## 2024-01-26 DIAGNOSIS — I10 Essential (primary) hypertension: Secondary | ICD-10-CM | POA: Diagnosis present

## 2024-01-26 DIAGNOSIS — Z7982 Long term (current) use of aspirin: Secondary | ICD-10-CM

## 2024-01-26 DIAGNOSIS — E785 Hyperlipidemia, unspecified: Secondary | ICD-10-CM | POA: Diagnosis present

## 2024-01-26 DIAGNOSIS — Z87891 Personal history of nicotine dependence: Secondary | ICD-10-CM

## 2024-01-26 DIAGNOSIS — Z951 Presence of aortocoronary bypass graft: Secondary | ICD-10-CM

## 2024-01-26 DIAGNOSIS — Z79899 Other long term (current) drug therapy: Secondary | ICD-10-CM

## 2024-01-26 DIAGNOSIS — T428X5A Adverse effect of antiparkinsonism drugs and other central muscle-tone depressants, initial encounter: Secondary | ICD-10-CM | POA: Diagnosis present

## 2024-01-26 DIAGNOSIS — E1165 Type 2 diabetes mellitus with hyperglycemia: Secondary | ICD-10-CM | POA: Diagnosis present

## 2024-01-26 DIAGNOSIS — I129 Hypertensive chronic kidney disease with stage 1 through stage 4 chronic kidney disease, or unspecified chronic kidney disease: Secondary | ICD-10-CM | POA: Diagnosis present

## 2024-01-26 DIAGNOSIS — K219 Gastro-esophageal reflux disease without esophagitis: Secondary | ICD-10-CM | POA: Diagnosis present

## 2024-01-26 DIAGNOSIS — M899 Disorder of bone, unspecified: Secondary | ICD-10-CM | POA: Diagnosis present

## 2024-01-26 LAB — RAPID URINE DRUG SCREEN, HOSP PERFORMED
Amphetamines: NOT DETECTED
Barbiturates: NOT DETECTED
Benzodiazepines: NOT DETECTED
Cocaine: NOT DETECTED
Opiates: NOT DETECTED
Tetrahydrocannabinol: NOT DETECTED

## 2024-01-26 LAB — CBG MONITORING, ED: Glucose-Capillary: 179 mg/dL — ABNORMAL HIGH (ref 70–99)

## 2024-01-26 LAB — CBC
HCT: 30.1 % — ABNORMAL LOW (ref 39.0–52.0)
HCT: 27.5 % — ABNORMAL LOW (ref 39.0–52.0)
Hemoglobin: 9.5 g/dL — ABNORMAL LOW (ref 13.0–17.0)
Hemoglobin: 8.9 g/dL — ABNORMAL LOW (ref 13.0–17.0)
MCH: 25.2 pg — ABNORMAL LOW (ref 26.0–34.0)
MCH: 25.6 pg — ABNORMAL LOW (ref 26.0–34.0)
MCHC: 31.6 g/dL (ref 30.0–36.0)
MCHC: 32.4 g/dL (ref 30.0–36.0)
MCV: 79.8 fL — ABNORMAL LOW (ref 80.0–100.0)
MCV: 79.3 fL — ABNORMAL LOW (ref 80.0–100.0)
Platelets: 544 10*3/uL — ABNORMAL HIGH (ref 150–400)
Platelets: 576 10*3/uL — ABNORMAL HIGH (ref 150–400)
RBC: 3.77 MIL/uL — ABNORMAL LOW (ref 4.22–5.81)
RBC: 3.47 MIL/uL — ABNORMAL LOW (ref 4.22–5.81)
RDW: 17.1 % — ABNORMAL HIGH (ref 11.5–15.5)
RDW: 17.3 % — ABNORMAL HIGH (ref 11.5–15.5)
WBC: 15.9 10*3/uL — ABNORMAL HIGH (ref 4.0–10.5)
WBC: 13.8 10*3/uL — ABNORMAL HIGH (ref 4.0–10.5)
nRBC: 0 % (ref 0.0–0.2)
nRBC: 0 % (ref 0.0–0.2)

## 2024-01-26 LAB — LACTIC ACID, PLASMA
Lactic Acid, Venous: 1.2 mmol/L (ref 0.5–1.9)
Lactic Acid, Venous: 1.1 mmol/L (ref 0.5–1.9)

## 2024-01-26 LAB — COMPREHENSIVE METABOLIC PANEL WITH GFR
ALT: 11 U/L (ref 0–44)
AST: 12 U/L — ABNORMAL LOW (ref 15–41)
Albumin: 3.2 g/dL — ABNORMAL LOW (ref 3.5–5.0)
Alkaline Phosphatase: 126 U/L (ref 38–126)
Anion gap: 13 (ref 5–15)
BUN: 26 mg/dL — ABNORMAL HIGH (ref 8–23)
CO2: 22 mmol/L (ref 22–32)
Calcium: 8.8 mg/dL — ABNORMAL LOW (ref 8.9–10.3)
Chloride: 99 mmol/L (ref 98–111)
Creatinine, Ser: 2.05 mg/dL — ABNORMAL HIGH (ref 0.61–1.24)
GFR, Estimated: 33 mL/min — ABNORMAL LOW (ref >60)
Glucose, Bld: 171 mg/dL — ABNORMAL HIGH (ref 70–99)
Potassium: 4.7 mmol/L (ref 3.5–5.1)
Sodium: 134 mmol/L — ABNORMAL LOW (ref 135–145)
Total Bilirubin: 0.7 mg/dL (ref 0.0–1.2)
Total Protein: 7.3 g/dL (ref 6.5–8.1)

## 2024-01-26 LAB — BLOOD GAS, VENOUS
Acid-Base Excess: 1.7 mmol/L (ref 0.0–2.0)
Bicarbonate: 25.8 mmol/L (ref 20.0–28.0)
Drawn by: 12815
O2 Saturation: 80.2 %
Patient temperature: 35.8
pCO2, Ven: 36 mmHg — ABNORMAL LOW (ref 44–60)
pH, Ven: 7.46 — ABNORMAL HIGH (ref 7.25–7.43)
pO2, Ven: 43 mmHg (ref 32–45)

## 2024-01-26 LAB — AMMONIA: Ammonia: <13 umol/L (ref 9–35)

## 2024-01-26 LAB — URINALYSIS, ROUTINE W REFLEX MICROSCOPIC
Bacteria, UA: NONE SEEN
Bilirubin Urine: NEGATIVE
Glucose, UA: >=500 mg/dL — AB
Hgb urine dipstick: NEGATIVE
Ketones, ur: NEGATIVE mg/dL
Leukocytes,Ua: NEGATIVE
Nitrite: NEGATIVE
Protein, ur: 30 mg/dL — AB
Specific Gravity, Urine: 1.009 (ref 1.005–1.030)
pH: 8 (ref 5.0–8.0)

## 2024-01-26 LAB — CREATININE, SERUM
Creatinine, Ser: 2.12 mg/dL — ABNORMAL HIGH (ref 0.61–1.24)
GFR, Estimated: 32 mL/min — ABNORMAL LOW (ref >60)

## 2024-01-26 LAB — GLUCOSE, CAPILLARY
Glucose-Capillary: 162 mg/dL — ABNORMAL HIGH (ref 70–99)
Glucose-Capillary: 136 mg/dL — ABNORMAL HIGH (ref 70–99)

## 2024-01-26 LAB — TSH: TSH: 1.749 u[IU]/mL (ref 0.350–4.500)

## 2024-01-26 MED ORDER — ACETAMINOPHEN 325 MG PO TABS
650.0000 mg | ORAL_TABLET | Freq: Four times a day (QID) | ORAL | Status: AC | PRN
Start: 2024-01-26 — End: ?
  Administered 2024-01-28 – 2024-01-29 (×3): 650 mg via ORAL
  Filled 2024-01-26 (×3): qty 2

## 2024-01-26 MED ORDER — LOSARTAN POTASSIUM 50 MG PO TABS
50.0000 mg | ORAL_TABLET | Freq: Every day | ORAL | Status: DC
  Administered 2024-01-28 – 2024-01-29 (×2): 50 mg via ORAL
  Filled 2024-01-26 (×3): qty 1

## 2024-01-26 MED ORDER — HYDRALAZINE HCL 20 MG/ML IJ SOLN: 10.0000 mg | INTRAMUSCULAR | Status: DC | PRN

## 2024-01-26 MED ORDER — HEPARIN SODIUM (PORCINE) 5000 UNIT/ML IJ SOLN
5000.0000 [IU] | Freq: Three times a day (TID) | INTRAMUSCULAR | Status: DC
  Administered 2024-01-26 – 2024-01-29 (×8): 5000 [IU] via SUBCUTANEOUS
  Filled 2024-01-26 (×8): qty 1

## 2024-01-26 MED ORDER — METOPROLOL TARTRATE 25 MG PO TABS
25.0000 mg | ORAL_TABLET | Freq: Two times a day (BID) | ORAL | Status: DC
  Administered 2024-01-28 – 2024-01-29 (×3): 25 mg via ORAL
  Filled 2024-01-26 (×8): qty 1

## 2024-01-26 MED ORDER — ASPIRIN 81 MG PO TBEC
81.0000 mg | DELAYED_RELEASE_TABLET | Freq: Every day | ORAL | Status: DC
  Administered 2024-01-28 – 2024-01-29 (×2): 81 mg via ORAL
  Filled 2024-01-26 (×2): qty 1

## 2024-01-26 MED ORDER — SODIUM CHLORIDE 0.9 % IV SOLN
INTRAVENOUS | Status: DC
  Administered 2024-01-27: 75 mL/h via INTRAVENOUS

## 2024-01-26 MED ORDER — ONDANSETRON HCL 4 MG/2ML IJ SOLN
4.0000 mg | Freq: Four times a day (QID) | INTRAMUSCULAR | Status: DC | PRN
  Administered 2024-01-27: 4 mg via INTRAVENOUS
  Filled 2024-01-26: qty 2

## 2024-01-26 MED ORDER — PANTOPRAZOLE SODIUM 40 MG PO TBEC
40.0000 mg | DELAYED_RELEASE_TABLET | Freq: Two times a day (BID) | ORAL | Status: DC
  Administered 2024-01-28 – 2024-01-29 (×3): 40 mg via ORAL
  Filled 2024-01-26 (×3): qty 1

## 2024-01-26 MED ORDER — ONDANSETRON HCL 4 MG PO TABS: 4.0000 mg | ORAL_TABLET | Freq: Four times a day (QID) | ORAL | Status: DC | PRN

## 2024-01-26 MED ORDER — CLOPIDOGREL BISULFATE 75 MG PO TABS
75.0000 mg | ORAL_TABLET | Freq: Every morning | ORAL | Status: DC
  Administered 2024-01-28 – 2024-01-29 (×2): 75 mg via ORAL
  Filled 2024-01-26 (×2): qty 1

## 2024-01-26 MED ORDER — ACETAMINOPHEN 650 MG RE SUPP: 650.0000 mg | Freq: Four times a day (QID) | RECTAL | Status: DC | PRN

## 2024-01-26 MED ORDER — HYDRALAZINE HCL 20 MG/ML IJ SOLN
10.0000 mg | INTRAMUSCULAR | Status: DC | PRN
  Administered 2024-01-26 – 2024-01-29 (×5): 10 mg via INTRAVENOUS
  Filled 2024-01-26 (×6): qty 1

## 2024-01-26 MED ORDER — AMLODIPINE BESYLATE 5 MG PO TABS
10.0000 mg | ORAL_TABLET | Freq: Every morning | ORAL | Status: DC
  Administered 2024-01-28 – 2024-01-29 (×2): 10 mg via ORAL
  Filled 2024-01-26 (×2): qty 2

## 2024-01-26 MED ORDER — INSULIN ASPART 100 UNIT/ML IJ SOLN
0.0000 [IU] | INTRAMUSCULAR | Status: DC
  Administered 2024-01-26: 1 [IU] via SUBCUTANEOUS
  Administered 2024-01-26: 2 [IU] via SUBCUTANEOUS
  Administered 2024-01-27: 1 [IU] via SUBCUTANEOUS
  Administered 2024-01-27 – 2024-01-28 (×5): 2 [IU] via SUBCUTANEOUS
  Administered 2024-01-28: 1 [IU] via SUBCUTANEOUS
  Administered 2024-01-28 (×2): 2 [IU] via SUBCUTANEOUS
  Administered 2024-01-28: 5 [IU] via SUBCUTANEOUS
  Administered 2024-01-28: 2 [IU] via SUBCUTANEOUS
  Administered 2024-01-29 (×3): 1 [IU] via SUBCUTANEOUS

## 2024-01-26 NOTE — ED Provider Notes (Signed)
 [place] [facility] AT [facility] Provider Note   CSN: [id number] Arrival date & time: 01/26/24  1241     Patient presents with: No chief complaint on file.   [name] is a 78 y.o. male.   HPI   This patient is a 78 year old male, history of hypertension on amlodipine , he takes Plavix  insulin  Lopressor  and losartan .  He presents to the hospital with altered mental status.  Evidently he had been seen at his primary care physician yesterday because of some neck pain and was started on baclofen and naproxen.  After taking this the patient went to bed and has not been able to wake up to any significant degree since that time.  I have reviewed the medical record including the oncology notes from June 11 approximately 3 days ago when he was seen in the office.  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  He was admitted to the hospital 2 weeks ago with sepsis from multifocal pneumonia  The patient is not able to give me any history as he is somnolent to obtunded    Prior to Admission medications   Medication Sig Start Date End Date Taking? Authorizing Provider  albuterol  (VENTOLIN  HFA) 108 (90 Base) MCG/ACT inhaler Inhale 2 puffs into the lungs every 6 (six) hours as needed for wheezing or shortness of breath. 01/03/24   Wynetta Heckle, MD  amLODipine  (NORVASC ) 10 MG tablet Take 10 mg by mouth in the morning.    [provider]  aspirin  EC 81 MG tablet Take 1 tablet (81 mg total) by mouth daily with breakfast. 05/23/19   Colin Dawley, MD  clopidogrel  (PLAVIX ) 75 MG tablet Take 75 mg by mouth in the morning.    [provider]   dextromethorphan -guaiFENesin  (MUCINEX  DM) 30-600 MG 12hr tablet Take 1 tablet by mouth 2 (two) times daily. 01/17/24   Justina Oman, MD  Fluticasone-Umeclidin-Vilant (TRELEGY ELLIPTA) 200-62.5-25 MCG/ACT AEPB Inhale 2 puffs into the lungs daily.    [provider]  gabapentin  (NEURONTIN ) 300 MG capsule Take 1 capsule (300 mg total) by mouth 2 (two) times daily. 01/03/24   Wynetta Heckle, MD  insulin  glargine (LANTUS ) 100 UNIT/ML injection Inject 0.1 mLs (10 Units total) into the skin at bedtime. Patient taking differently: Inject 20 Units into the skin at bedtime. 12/11/23   Johnson, Clanford L, MD  losartan  (COZAAR ) 50 MG tablet Take 50 mg by mouth daily. 03/02/22   [provider]  metoprolol  tartrate (LOPRESSOR ) 50 MG tablet Take 25 mg by mouth 2 (two) times daily.     [provider]  pantoprazole  (PROTONIX ) 40 MG tablet Take 1 tablet (40 mg total) by mouth 2 (two) times daily. 01/03/24   Wynetta Heckle, MD  vitamin B-12 (CYANOCOBALAMIN ) 1000 MCG tablet Take 1,000 mcg by mouth in the morning.    [provider]    Allergies: Acarbose    Review of Systems  Unable to perform ROS: Mental status change    Updated Vital Signs BP (!) 177/70   Pulse (!) 105   Temp (!) 96.6 F (35.9 C) (Rectal)   Resp 18   Ht  1.702 m (5' 7)   Wt 62.6 kg   SpO2 97%   BMI 21.61 kg/m   Physical Exam Vitals and nursing note reviewed.  Constitutional:      Appearance: He is well-developed. He is ill-appearing. He is not toxic-appearing or diaphoretic.  HENT:     Head: Normocephalic and atraumatic.     Mouth/Throat:     Pharynx: No oropharyngeal exudate.   Eyes:     General: No scleral icterus.       Right eye: No discharge.        Left eye: No discharge.     Conjunctiva/sclera: Conjunctivae normal.     Pupils: Pupils are equal, round, and reactive to light.   Neck:     Thyroid : No thyromegaly.     Vascular: No JVD.   Cardiovascular:     Rate and Rhythm: Normal  rate and regular rhythm.     Heart sounds: Normal heart sounds. No murmur heard.    No friction rub. No gallop.  Pulmonary:     Effort: Pulmonary effort is normal. No respiratory distress.     Breath sounds: Normal breath sounds. No wheezing or rales.  Abdominal:     General: Bowel sounds are normal. There is no distension.     Palpations: Abdomen is soft. There is no mass.     Tenderness: There is no abdominal tenderness.   Musculoskeletal:        General: No tenderness. Normal range of motion.     Cervical back: Normal range of motion and neck supple.     Right lower leg: No edema.     Left lower leg: No edema.  Lymphadenopathy:     Cervical: No cervical adenopathy.   Skin:    General: Skin is warm and dry.     Findings: No erythema or rash.   Neurological:     Comments: The patient is not able to follow many commands, he will open his eyes or open his mouth in response to a loud voice and painful stimuli, he will not answer questions, he is difficult to arouse, all 4 extremities respond to painful stimuli.  He does have the occasional myoclonic jerking of his legs  Psychiatric:        Behavior: Behavior normal.     (all labs ordered are listed, but only abnormal results are displayed) Labs Reviewed  COMPREHENSIVE METABOLIC PANEL WITH GFR - Abnormal; Notable for the following components:      Result Value   Sodium 134 (*)    Glucose, Bld 171 (*)    BUN 26 (*)    Creatinine, Ser 2.05 (*)    Calcium  8.8 (*)    Albumin  3.2 (*)    AST 12 (*)    GFR, Estimated 33 (*)    All other components within normal limits  CBC - Abnormal; Notable for the following components:   WBC 15.9 (*)    RBC 3.77 (*)    Hemoglobin 9.5 (*)    HCT 30.1 (*)    MCV 79.8 (*)    MCH 25.2 (*)    RDW 17.1 (*)    Platelets 544 (*)    All other components within normal limits  BLOOD GAS, VENOUS - Abnormal; Notable for the following components:   pH, Ven 7.46 (*)    pCO2, Ven 36 (*)    All other  components within normal limits  CBG MONITORING, ED - Abnormal; Notable for the following components:  Glucose-Capillary 179 (*)    All other components within normal limits  CULTURE, BLOOD (ROUTINE X 2)  CULTURE, BLOOD (ROUTINE X 2)  LACTIC ACID, PLASMA  AMMONIA  URINALYSIS, ROUTINE W REFLEX MICROSCOPIC  LACTIC ACID, PLASMA  CBG MONITORING, ED    EKG: EKG Interpretation Date/Time:  Saturday January 26 2024 13:55:19 EDT Ventricular Rate:  97 PR Interval:  218 QRS Duration:  106 QT Interval:  381 QTC Calculation: 484 R Axis:   65  Text Interpretation: duplicat e Confirmed by Early Glisson ([id number]) on 01/26/2024 2:01:05 PM  Radiology: CT Head Wo Contrast Result Date: 01/26/2024 CLINICAL DATA:  Neck pain, somnolent, difficulty to arouse, altered level of consciousness EXAM: CT HEAD WITHOUT CONTRAST TECHNIQUE: Contiguous axial images were obtained from the base of the skull through the vertex without intravenous contrast. RADIATION DOSE REDUCTION: This exam was performed according to the departmental dose-optimization program which includes automated exposure control, adjustment of the mA and/or kV according to patient size and/or use of iterative reconstruction technique. COMPARISON:  01/03/2024 FINDINGS: Brain: No acute infarct or hemorrhage. Lateral ventricles and midline structures are stable. No acute extra-axial fluid collections. No mass effect. Vascular: No hyperdense vessel or unexpected calcification. Stable atherosclerosis. Skull: Normal. Negative for fracture or focal lesion. Sinuses/Orbits: Right mastoid effusion is again noted. Paranasal sinuses are clear. Other: None. IMPRESSION: 1. Stable head CT, no acute intracranial process. Electronically Signed   By: Bobbye Burrow M.D.   On: 01/26/2024 13:44     Procedures   Medications Ordered in the ED - No data to display                                  Medical Decision Making Amount and/or Complexity of Data Reviewed Labs:  ordered. Radiology: ordered.  Risk Decision regarding hospitalization.    This patient presents to the ED for concern of altered mental status, this involves an extensive number of treatment options, and is a complaint that carries with it a high risk of complications and morbidity.  The differential diagnosis includes recurrent infection, although it is most likely related to the baclofen as this was recently started and has no neurologic side effects   Co morbidities / Chronic conditions that complicate the patient evaluation  Recent admission for sepsis, hypertension, diabetes   Additional history obtained:  Additional history obtained from EMR External records from outside source obtained and reviewed including medical record including recent admissions to the hospital and oncology workup   Lab Tests:  I Ordered, and personally interpreted labs.  The pertinent results include: Normal CBC and metabolic panel at baseline with some renal insufficiency   Imaging Studies ordered:  I ordered imaging studies including CT scan of the brain I independently visualized and interpreted imaging which showed no acute findings I agree with the radiologist interpretation   Cardiac Monitoring: / EKG:  The patient was maintained on a cardiac monitor.  I personally viewed and interpreted the cardiac monitored which showed an underlying rhythm of: Mild sinus tachycardia   Problem List / ED Course / Critical interventions / Medication management  The patient has a persistent altered mental status without a definite cause on CT scan or labs, urinalysis not available as the patient was incontinent on arrival I ordered medication including none   Reevaluation of the patient after these medicines showed that the patient no significant changes I have reviewed the patients home medicines and  have made adjustments as needed   Consultations Obtained:  I requested consultation with the  hospitalist Dr. Mason Sole,  and discussed lab and imaging findings as well as pertinent plan - they recommend: Admission to the hospital   Social Determinants of Health:  Altered mental status   Test / Admission - Considered:  Admit, unknown cause of encephalopathy but I suspect is related to the baclofen      Final diagnoses:  Acute encephalopathy    ED Discharge Orders     None          Early Glisson, MD 01/26/24 1417

## 2024-01-26 NOTE — ED Notes (Signed)
 Pt linen changed, brief applied, condom cath applied, and bear hugger applied.

## 2024-01-26 NOTE — ED Notes (Signed)
 Bair hugger continues. 2nd IV established.  BC x1 and ammonia level sent. Family x2 at [facility]. Attempted EKG with lead issues, will continue upon return.

## 2024-01-26 NOTE — ED Notes (Signed)
 Pt did urinate on himself during EMS ride, pt was cleaned, waiting for new urine sample through condom cath to obtain UA.

## 2024-01-26 NOTE — ED Notes (Signed)
 Pending transporter. Family x2 at [facility]. Pt sleeping, arousable.

## 2024-01-26 NOTE — H&P (Signed)
 History and Physical    [name] ZOX:[id number] DOB: 10-02-1945 DOA: 01/26/2024  PCP: [name]   Patient coming from: Home  Chief Complaint: Increased somnolence/AMS  HPI: [name] is a 78 y.o. male with medical history significant for CAD with prior CABG, hypertension, type 2 diabetes, dyslipidemia, CKD stage IV, diverticulitis status post colostomy, atrial tachycardia, peripheral vascular disease, TIA, and recent discharge on 6/5 after treatment for multifocal pneumonia with concern for aspiration who presented to the ED via EMS due to increased somnolence/decreased responsiveness after starting baclofen and naproxen yesterday for neck pain by his PCP.  Wife is at bedside who states that he took his baclofen yesterday at 64 AM shortly after his appointment and then 8 PM.  He was not arousable this morning and had some mild myoclonic jerks and seemed very confused overall.  Therefore EMS was called by family members for further evaluation.   ED Course: Vital signs with some mild hypothermia temperature 96.6 F and respirations of 22.  Leukocytosis of 15,900 noted and hemoglobin 9.5 with sodium 134.  Creatinine 2.05.  CT head with no acute findings noted.  UA could not be obtained at this time.  Review of Systems: Could not be obtained given patient condition.  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   x 5   Hypercholesteremia    Hypertension    Neuropathy    Peripheral vascular disease (HCC)    s/p R-L FEM-FEM BYPASS   Pneumonia    Tachycardia    after CABG, pt on medicine for this    Past Surgical History:  Procedure Laterality  Date   ABDOMINAL AORTOGRAM Bilateral 09/19/2016   Procedure: iliac;  Surgeon: Margherita Shell, MD;  [facility];  Service: Cardiovascular;  Laterality: Bilateral;   ABDOMINAL AORTOGRAM W/LOWER EXTREMITY N/A 09/22/2021   Procedure: ABDOMINAL AORTOGRAM W/LOWER EXTREMITY;  Surgeon: Young Hensen, MD;  [facility]: [facility];  Service: Cardiovascular;  Laterality: N/A;   ABDOMINAL AORTOGRAM W/LOWER EXTREMITY N/A 10/05/2022   Procedure: ABDOMINAL AORTOGRAM W/LOWER EXTREMITY;  Surgeon: Young Hensen, MD;  [facility]: [facility];  Service: Cardiovascular;  Laterality: N/A;   BIOPSY  05/16/2023   Procedure: BIOPSY;  Surgeon: Suzette Espy, MD;  [facility]: [facility];  Service: Endoscopy;;   CARDIAC CATHETERIZATION  2003   with stent   CARDIAC CATHETERIZATION N/A 10/11/2015   Procedure: Left Heart Cath and Coronary Angiography;  Surgeon: Peter M Swaziland, MD;  Location: [facility] INVASIVE CV LAB;  Service: Cardiovascular;  Laterality: N/A;   COLON RESECTION N/A 12/13/2017   Procedure: EXPLORATORY LAPAROTOMY, SIGMOID COLECTOMY WITH COLOSTOMY;  Surgeon: Dareen Ebbing, MD;  Location: [facility];  Service: General;  Laterality: N/A;   COLONOSCOPY N/A 09/22/2013   Procedure: COLONOSCOPY;  Surgeon: Suzette Espy, MD;  [facility];  Service: Endoscopy;  Laterality: N/A;  9:30 AM   COLONOSCOPY N/A 02/19/2018   Procedure: COLONOSCOPY;  Surgeon: Suzette Espy, MD;  [facility];  Service: Endoscopy;  Laterality: N/A;  2:00pm   COLONOSCOPY WITH PROPOFOL  N/A 05/16/2023  Procedure: COLONOSCOPY WITH PROPOFOL ;  Surgeon: Suzette Espy, MD;  [facility];  Service: Endoscopy;  Laterality: N/A;  730am, asa 3   COLOSTOMY N/A 12/13/2017   Procedure: COLOSTOMY;  Surgeon: Dareen Ebbing, MD;  Location: [facility] OR;  Service: General;  Laterality: N/A;   COLOSTOMY REVERSAL N/A 03/26/2018   Procedure: COLOSTOMY REVERSAL;  Surgeon: Dareen Ebbing, MD;   [facility];  Service: General;  Laterality: N/A;   CORONARY ARTERY BYPASS GRAFT N/A 10/18/2015   Procedure: CORONARY ARTERY BYPASS GRAFTING (CABG) x  five, using left internal mammary artery and right leg greater saphenous vein harvested endoscopically;  Surgeon: Zelphia Higashi, MD;  Location[facility];  Service: Open Heart Surgery;  Laterality: N/A;   ENDARTERECTOMY FEMORAL Right 08/09/2016   Procedure: ENDARTERECTOMY FEMORAL WITH VEIN PATCH ANGIOPLASTY;  Surgeon: Margherita Shell, MD;  [facility];  Service: Vascular;  Laterality: Right;   ESOPHAGOGASTRODUODENOSCOPY N/A 10/24/2017   Dr. Riley Cheadle: hiatal hernia   ESOPHAGOGASTRODUODENOSCOPY N/A 01/01/2024   Procedure: EGD (ESOPHAGOGASTRODUODENOSCOPY);  Surgeon: Hargis Lias, MD;  [facility];  Service: Endoscopy;  Laterality: N/A;   ESOPHAGOGASTRODUODENOSCOPY (EGD) WITH PROPOFOL  N/A 05/16/2023   Procedure: ESOPHAGOGASTRODUODENOSCOPY (EGD) WITH PROPOFOL ;  Surgeon: Suzette Espy, MD;  [facility];  Service: Endoscopy;  Laterality: N/A;   FEMORAL-FEMORAL BYPASS GRAFT Bilateral 08/09/2016   Procedure: REVISION BYPASS GRAFT RIGHT FEMORAL-LEFT FEMORAL ARTERY;  Surgeon: Margherita Shell, MD;  Location: [facility] OR;  Service: Vascular;  Laterality: Bilateral;   FEMORAL-POPLITEAL BYPASS GRAFT     INTRAMEDULLARY (IM) NAIL INTERTROCHANTERIC Left 04/03/2021   Procedure: LEFT  HIP INTRAMEDULLARY (IM) NAIL INTERTROCHANTRIC;  Surgeon: Arnie Lao, MD;  [facility];  Service: Orthopedics;  Laterality: Left;   IR FLUORO GUIDED NEEDLE PLC ASPIRATION/INJECTION LOC  12/26/2023   IR IVC FILTER RETRIEVAL / S&I Dan Dun GUID/MOD SED  01/15/2024   MALONEY DILATION N/A 05/16/2023   Procedure: Londa Rival DILATION;  Surgeon: Suzette Espy, MD;  [facility];  Service: Endoscopy;  Laterality: N/A;   PERIPHERAL VASCULAR BALLOON ANGIOPLASTY Right 09/22/2021   Procedure: PERIPHERAL VASCULAR BALLOON ANGIOPLASTY;  Surgeon:  Young Hensen, MD;  [facility];  Service: Cardiovascular;  Laterality: Right;   PERIPHERAL VASCULAR CATHETERIZATION N/A 05/08/2016   Procedure: Lower Extremity Angiography;  Surgeon: Avanell Leigh, MD;  Location: [facility] INVASIVE CV LAB;  Service: Cardiovascular;  Laterality: N/A;   PERIPHERAL VASCULAR INTERVENTION Right 09/19/2016   Procedure: Peripheral Vascular Intervention;  Surgeon: Margherita Shell, MD;  [facility];  Service: Cardiovascular;  Laterality: Right;  ext iliac stent   PERIPHERAL VASCULAR INTERVENTION Right 09/22/2021   Procedure: PERIPHERAL VASCULAR INTERVENTION;  Surgeon: Young Hensen, MD;  [facility];  Service: Cardiovascular;  Laterality: Right;   POLYPECTOMY  02/19/2018   Procedure: POLYPECTOMY;  Surgeon: Suzette Espy, MD;  [facility];  Service: Endoscopy;;   PROCTOSCOPY  10/24/2017   Procedure: PROCTOSCOPY;  Surgeon: Suzette Espy, MD;  [facility];  Service: Endoscopy;;   TEE WITHOUT CARDIOVERSION N/A 10/18/2015   Procedure: TRANSESOPHAGEAL ECHOCARDIOGRAM (TEE);  Surgeon: Zelphia Higashi, MD;  Location: [facility] OR;  Service: Open Heart Surgery;  Laterality: N/A;     reports that he quit smoking about 23 years ago. His smoking use included cigarettes. He started smoking about 53 years ago. He has a 60 pack-year smoking history. He has never used smokeless tobacco. He  reports that he does not drink alcohol  and does not use drugs.  Allergies  Allergen Reactions   Acarbose Diarrhea    Family History  Problem Relation Age of Onset   Heart attack Mother    Stroke Mother    Colon cancer Brother        late 72s    Prior to Admission medications   Medication Sig Start Date End Date Taking? Authorizing Provider  albuterol  (VENTOLIN  HFA) 108 (90 Base) MCG/ACT inhaler Inhale 2 puffs into the lungs every 6 (six) hours as needed for wheezing or shortness of breath. 01/03/24    Wynetta Heckle, MD  amLODipine  (NORVASC ) 10 MG tablet Take 10 mg by mouth in the morning.    [provider]  aspirin  EC 81 MG tablet Take 1 tablet (81 mg total) by mouth daily with breakfast. 05/23/19   Quintella Buck, Courage, MD  clopidogrel  (PLAVIX ) 75 MG tablet Take 75 mg by mouth in the morning.    [provider]  dextromethorphan -guaiFENesin  (MUCINEX  DM) 30-600 MG 12hr tablet Take 1 tablet by mouth 2 (two) times daily. 01/17/24   Justina Oman, MD  Fluticasone-Umeclidin-Vilant (TRELEGY ELLIPTA) 200-62.5-25 MCG/ACT AEPB Inhale 2 puffs into the lungs daily.    [provider]  gabapentin  (NEURONTIN ) 300 MG capsule Take 1 capsule (300 mg total) by mouth 2 (two) times daily. 01/03/24   Wynetta Heckle, MD  insulin  glargine (LANTUS ) 100 UNIT/ML injection Inject 0.1 mLs (10 Units total) into the skin at bedtime. Patient taking differently: Inject 20 Units into the skin at bedtime. 12/11/23   Johnson, Clanford L, MD  losartan  (COZAAR ) 50 MG tablet Take 50 mg by mouth daily. 03/02/22   [provider]  metoprolol  tartrate (LOPRESSOR ) 50 MG tablet Take 25 mg by mouth 2 (two) times daily.     [provider]  pantoprazole  (PROTONIX ) 40 MG tablet Take 1 tablet (40 mg total) by mouth 2 (two) times daily. 01/03/24   Wynetta Heckle, MD  vitamin B-12 (CYANOCOBALAMIN ) 1000 MCG tablet Take 1,000 mcg by mouth in the morning.    [provider]    Physical Exam: Vitals:   01/26/24 1400 01/26/24 1415 01/26/24 1430 01/26/24 1438  BP: (!) 177/70 (!) 141/73 (!) 152/64   Pulse: (!) 105 92 93   Resp: 18 (!) 28 14   Temp:    98.5 F (36.9 C)  TempSrc:    Oral  SpO2: 97% 96% 95%   Weight:      Height:        Constitutional: NAD, obtunded Vitals:   01/26/24 1400 01/26/24 1415 01/26/24 1430 01/26/24 1438  BP: (!) 177/70 (!) 141/73 (!) 152/64   Pulse: (!) 105 92 93   Resp: 18 (!) 28 14   Temp:    98.5 F (36.9 C)  TempSrc:    Oral  SpO2: 97% 96% 95%   Weight:       Height:       Eyes: lids and conjunctivae normal Neck: normal, supple Respiratory: clear to auscultation bilaterally. Normal respiratory effort. No accessory muscle use.  Nasal cannula present Cardiovascular: Regular rate and rhythm, no murmurs. Abdomen: no tenderness, no distention. Bowel sounds positive.  Musculoskeletal:  No edema. Skin: no rashes, lesions, ulcers.   Labs on Admission: I have personally reviewed following labs and imaging studies  CBC: Recent Labs  Lab 01/23/24 1029 01/26/24 1251  WBC 10.5 15.9*  NEUTROABS 8.0*  --   HGB 8.8* 9.5*  HCT 27.9* 30.1*  MCV 80.6 79.8*  PLT 497* 544*   Basic Metabolic Panel: Recent Labs  Lab 01/26/24 1251  NA 134*  K 4.7  CL 99  CO2 22  GLUCOSE 171*  BUN 26*  CREATININE 2.05*  CALCIUM  8.8*   GFR: Estimated Creatinine Clearance: 27.1 mL/min (A) (by C-G formula based on SCr of 2.05 mg/dL (H)). Liver Function Tests: Recent Labs  Lab 01/26/24 1251  AST 12*  ALT 11  ALKPHOS 126  BILITOT 0.7  PROT 7.3  ALBUMIN  3.2*   No results for input(s): LIPASE, AMYLASE in the last 168 hours. Recent Labs  Lab 01/26/24 1313  AMMONIA <13   Coagulation Profile: No results for input(s): INR, PROTIME in the last 168 hours. Cardiac Enzymes: No results for input(s): CKTOTAL, CKMB, CKMBINDEX, TROPONINI in the last 168 hours. BNP (last 3 results) No results for input(s): PROBNP in the last 8760 hours. HbA1C: No results for input(s): HGBA1C in the last 72 hours. CBG: Recent Labs  Lab 01/26/24 1313  GLUCAP 179*   Lipid Profile: No results for input(s): CHOL, HDL, LDLCALC, TRIG, CHOLHDL, LDLDIRECT in the last 72 hours. Thyroid  Function Tests: No results for input(s): TSH, T4TOTAL, FREET4, T3FREE, THYROIDAB in the last 72 hours. Anemia Panel: No results for input(s): VITAMINB12, FOLATE, FERRITIN, TIBC, IRON , RETICCTPCT in the last 72 hours. Urine analysis:    Component  Value Date/Time   COLORURINE STRAW (A) 01/13/2024 1501   APPEARANCEUR CLEAR 01/13/2024 1501   LABSPEC 1.009 01/13/2024 1501   PHURINE 7.0 01/13/2024 1501   GLUCOSEU >=500 (A) 01/13/2024 1501   HGBUR NEGATIVE 01/13/2024 1501   BILIRUBINUR NEGATIVE 01/13/2024 1501   KETONESUR NEGATIVE 01/13/2024 1501   PROTEINUR 30 (A) 01/13/2024 1501   UROBILINOGEN 0.2 03/16/2015 0120   NITRITE NEGATIVE 01/13/2024 1501   LEUKOCYTESUR NEGATIVE 01/13/2024 1501    Radiological Exams on Admission: CT Head Wo Contrast Result Date: 01/26/2024 CLINICAL DATA:  Neck pain, somnolent, difficulty to arouse, altered level of consciousness EXAM: CT HEAD WITHOUT CONTRAST TECHNIQUE: Contiguous axial images were obtained from the base of the skull through the vertex without intravenous contrast. RADIATION DOSE REDUCTION: This exam was performed according to the departmental dose-optimization program which includes automated exposure control, adjustment of the mA and/or kV according to patient size and/or use of iterative reconstruction technique. COMPARISON:  01/03/2024 FINDINGS: Brain: No acute infarct or hemorrhage. Lateral ventricles and midline structures are stable. No acute extra-axial fluid collections. No mass effect. Vascular: No hyperdense vessel or unexpected calcification. Stable atherosclerosis. Skull: Normal. Negative for fracture or focal lesion. Sinuses/Orbits: Right mastoid effusion is again noted. Paranasal sinuses are clear. Other: None. IMPRESSION: 1. Stable head CT, no acute intracranial process. Electronically Signed   By: Bobbye Burrow M.D.   On: 01/26/2024 13:44    EKG: Independently reviewed.  SR 97 bpm.  Assessment/Plan Principal Problem:   Acute encephalopathy Active Problems:   Essential hypertension   HLD (hyperlipidemia)   Type 2 diabetes mellitus (HCC)   Type 2 diabetes mellitus with diabetic neuropathy (HCC)   Hx of CABG   S/P colostomy takedown   Lytic bone lesions on xray    Metastatic cancer to bone (HCC)   Chronic kidney disease (CKD), stage IV (severe) (HCC)    Acute toxic encephalopathy - Secondary to recent baclofen use for neck pain, patient currently somnolent and at baseline alert and communicative - CT head with no acute findings - Avoid sedating medications - Plan to start some gentle normal  saline IV  Leukocytosis - No obvious source of infection at the moment - Plan to check UA once able  Peripheral vascular disease - Continue aspirin  and Plavix   Essential hypertension -Currently with elevated blood pressure readings - Continue Norvasc , losartan  and metoprolol  - IV hydralazine  as needed for significant elevations  Peripheral neuropathy - Hold Neurontin  for now until more awake and alert  Uncontrolled type 2 diabetes - Recent A1c 7.6% - SSI and carb modified diet  CAD - Continue aspirin  and Plavix  - Continued follow-up outpatient with cardiology  Suspected metastatic cancer to bone - Follows up with oncology Dr. Cheree Cords and last seen 6/11 with quantitative immunoglobulins pending and plan for repeat scan in 3-6 months  CKD stage IV - Creatinine currently at baseline - Continue to monitor  DVT prophylaxis: Heparin  Code Status: Full Family Communication: Wife and brother at bedside 6/14 Disposition Plan: Observe for improvement from medication side effect Consults called: None Admission status: Observation, telemetry  Severity of Illness: The appropriate patient status for this patient is OBSERVATION. Observation status is judged to be reasonable and necessary in order to provide the required intensity of service to ensure the patient's safety. The patient's presenting symptoms, physical exam findings, and initial radiographic and laboratory data in the context of their medical condition is felt to place them at decreased risk for further clinical deterioration. Furthermore, it is anticipated that the patient will be medically  stable for discharge from the hospital within 2 midnights of admission.    Duff Pozzi D Mason Sole DO [facility]  If 7PM-7AM, please contact night-coverage [email or web address]  01/26/2024, 2:41 PM

## 2024-01-26 NOTE — ED Triage Notes (Signed)
 Pt was seen at his PCP yesterday for neck pain and was started on Baclofen 10 mg and naproxen for pain. After taking the new meds pt went to bed and has been sleeping for 15 hours and has been difficult to arouse. Pt has a hx of stage 4 CKD, bone and lung lesions that were non-cancerous on biopsy. EMS reports an axillary temp of 96.4 and a EKG that is sinus with a variable rate that is in the 80's and then temporarily jumps to 130s.

## 2024-01-27 DIAGNOSIS — Z87891 Personal history of nicotine dependence: Secondary | ICD-10-CM | POA: Diagnosis not present

## 2024-01-27 DIAGNOSIS — G8929 Other chronic pain: Secondary | ICD-10-CM | POA: Diagnosis present

## 2024-01-27 DIAGNOSIS — G9340 Encephalopathy, unspecified: Secondary | ICD-10-CM | POA: Diagnosis present

## 2024-01-27 DIAGNOSIS — N184 Chronic kidney disease, stage 4 (severe): Secondary | ICD-10-CM | POA: Diagnosis present

## 2024-01-27 DIAGNOSIS — R680 Hypothermia, not associated with low environmental temperature: Secondary | ICD-10-CM | POA: Diagnosis present

## 2024-01-27 DIAGNOSIS — D72829 Elevated white blood cell count, unspecified: Secondary | ICD-10-CM | POA: Diagnosis present

## 2024-01-27 DIAGNOSIS — I2510 Atherosclerotic heart disease of native coronary artery without angina pectoris: Secondary | ICD-10-CM | POA: Diagnosis present

## 2024-01-27 DIAGNOSIS — E1165 Type 2 diabetes mellitus with hyperglycemia: Secondary | ICD-10-CM | POA: Diagnosis present

## 2024-01-27 DIAGNOSIS — Z8249 Family history of ischemic heart disease and other diseases of the circulatory system: Secondary | ICD-10-CM | POA: Diagnosis not present

## 2024-01-27 DIAGNOSIS — Z7982 Long term (current) use of aspirin: Secondary | ICD-10-CM | POA: Diagnosis not present

## 2024-01-27 DIAGNOSIS — Z79899 Other long term (current) drug therapy: Secondary | ICD-10-CM | POA: Diagnosis not present

## 2024-01-27 DIAGNOSIS — Z823 Family history of stroke: Secondary | ICD-10-CM | POA: Diagnosis not present

## 2024-01-27 DIAGNOSIS — Z888 Allergy status to other drugs, medicaments and biological substances status: Secondary | ICD-10-CM | POA: Diagnosis not present

## 2024-01-27 DIAGNOSIS — E1122 Type 2 diabetes mellitus with diabetic chronic kidney disease: Secondary | ICD-10-CM | POA: Diagnosis present

## 2024-01-27 DIAGNOSIS — Z8673 Personal history of transient ischemic attack (TIA), and cerebral infarction without residual deficits: Secondary | ICD-10-CM | POA: Diagnosis not present

## 2024-01-27 DIAGNOSIS — Z800 Family history of malignant neoplasm of digestive organs: Secondary | ICD-10-CM | POA: Diagnosis not present

## 2024-01-27 DIAGNOSIS — G928 Other toxic encephalopathy: Secondary | ICD-10-CM | POA: Diagnosis present

## 2024-01-27 DIAGNOSIS — Z951 Presence of aortocoronary bypass graft: Secondary | ICD-10-CM | POA: Diagnosis not present

## 2024-01-27 DIAGNOSIS — Z7902 Long term (current) use of antithrombotics/antiplatelets: Secondary | ICD-10-CM | POA: Diagnosis not present

## 2024-01-27 DIAGNOSIS — T428X5A Adverse effect of antiparkinsonism drugs and other central muscle-tone depressants, initial encounter: Secondary | ICD-10-CM | POA: Diagnosis present

## 2024-01-27 DIAGNOSIS — Z794 Long term (current) use of insulin: Secondary | ICD-10-CM | POA: Diagnosis not present

## 2024-01-27 DIAGNOSIS — Z933 Colostomy status: Secondary | ICD-10-CM | POA: Diagnosis not present

## 2024-01-27 DIAGNOSIS — I129 Hypertensive chronic kidney disease with stage 1 through stage 4 chronic kidney disease, or unspecified chronic kidney disease: Secondary | ICD-10-CM | POA: Diagnosis present

## 2024-01-27 DIAGNOSIS — C7951 Secondary malignant neoplasm of bone: Secondary | ICD-10-CM | POA: Diagnosis present

## 2024-01-27 DIAGNOSIS — E1140 Type 2 diabetes mellitus with diabetic neuropathy, unspecified: Secondary | ICD-10-CM | POA: Diagnosis present

## 2024-01-27 LAB — GLUCOSE, CAPILLARY
Glucose-Capillary: 106 mg/dL — ABNORMAL HIGH (ref 70–99)
Glucose-Capillary: 137 mg/dL — ABNORMAL HIGH (ref 70–99)
Glucose-Capillary: 140 mg/dL — ABNORMAL HIGH (ref 70–99)
Glucose-Capillary: 154 mg/dL — ABNORMAL HIGH (ref 70–99)
Glucose-Capillary: 162 mg/dL — ABNORMAL HIGH (ref 70–99)
Glucose-Capillary: 174 mg/dL — ABNORMAL HIGH (ref 70–99)
Glucose-Capillary: 188 mg/dL — ABNORMAL HIGH (ref 70–99)

## 2024-01-27 LAB — CBC
HCT: 27.6 % — ABNORMAL LOW (ref 39.0–52.0)
Hemoglobin: 8.8 g/dL — ABNORMAL LOW (ref 13.0–17.0)
MCH: 25.3 pg — ABNORMAL LOW (ref 26.0–34.0)
MCHC: 31.9 g/dL (ref 30.0–36.0)
MCV: 79.3 fL — ABNORMAL LOW (ref 80.0–100.0)
Platelets: 539 10*3/uL — ABNORMAL HIGH (ref 150–400)
RBC: 3.48 MIL/uL — ABNORMAL LOW (ref 4.22–5.81)
RDW: 17.3 % — ABNORMAL HIGH (ref 11.5–15.5)
WBC: 13.8 10*3/uL — ABNORMAL HIGH (ref 4.0–10.5)
nRBC: 0 % (ref 0.0–0.2)

## 2024-01-27 LAB — BASIC METABOLIC PANEL WITH GFR
Anion gap: 12 (ref 5–15)
BUN: 24 mg/dL — ABNORMAL HIGH (ref 8–23)
CO2: 22 mmol/L (ref 22–32)
Calcium: 8.5 mg/dL — ABNORMAL LOW (ref 8.9–10.3)
Chloride: 99 mmol/L (ref 98–111)
Creatinine, Ser: 1.94 mg/dL — ABNORMAL HIGH (ref 0.61–1.24)
GFR, Estimated: 35 mL/min — ABNORMAL LOW (ref >60)
Glucose, Bld: 138 mg/dL — ABNORMAL HIGH (ref 70–99)
Potassium: 4.4 mmol/L (ref 3.5–5.1)
Sodium: 133 mmol/L — ABNORMAL LOW (ref 135–145)

## 2024-01-27 LAB — MAGNESIUM: Magnesium: 2.2 mg/dL (ref 1.7–2.4)

## 2024-01-27 MED ORDER — LABETALOL HCL 5 MG/ML IV SOLN
5.0000 mg | Freq: Four times a day (QID) | INTRAVENOUS | Status: DC | PRN
  Administered 2024-01-27 – 2024-01-29 (×3): 5 mg via INTRAVENOUS
  Filled 2024-01-27 (×3): qty 4

## 2024-01-27 MED ORDER — SODIUM CHLORIDE 0.9 % IV SOLN
INTRAVENOUS | Status: AC
  Administered 2024-01-27: 75 mL/h via INTRAVENOUS

## 2024-01-27 MED ORDER — HALOPERIDOL LACTATE 5 MG/ML IJ SOLN
2.0000 mg | Freq: Once | INTRAMUSCULAR | Status: AC
  Administered 2024-01-27: 2 mg via INTRAMUSCULAR
  Filled 2024-01-27: qty 1

## 2024-01-27 MED ORDER — SODIUM CHLORIDE 0.9 % IV SOLN: INTRAVENOUS | Status: DC

## 2024-01-27 NOTE — TOC CM/SW Note (Signed)
 Transition of Care [facility]) - Inpatient Brief Assessment   Patient Details  Name: [name] MRN: [id number] Date of Birth: 1946-07-26  Transition of [facility]) CM/SW Contact:    Grandville Lax, LCSWA Phone Number: 01/27/2024, 11:41 AM   Clinical Narrative: [facility] notified [facility]ts hospital admission. [facility] notification ID is [phone].   Transition of Care Department [facility]) has reviewed patient and no TOC needs have been identified at this time. We will continue to monitor patient advancement through interdiciplinary progression rounds. If new patient transition needs arise, please place a TOC consult.  Transition of Care Asessment: Insurance and Status: Insurance coverage has been reviewed Patient has primary care physician: Yes Home environment has been reviewed: From home with wife Prior level of function:: Has assistance if needed Prior/Current Home Services: No current home services Social Drivers of Health Review: SDOH reviewed no interventions necessary Readmission risk has been reviewed: Yes Transition of care needs: no transition of care needs at this time

## 2024-01-27 NOTE — Progress Notes (Signed)
 PROGRESS NOTE    LEXANDER Dunn  ZOX:[id number] DOB: 09/24/1945 DOA: 01/26/2024 PCP: [name]   Brief Narrative:    [name] is a 78 y.o. male with medical history significant for CAD with prior CABG, hypertension, type 2 diabetes, dyslipidemia, CKD stage IV, diverticulitis status post colostomy, atrial tachycardia, peripheral vascular disease, TIA, and recent discharge on 6/5 after treatment for multifocal pneumonia with concern for aspiration who presented to the ED via EMS due to increased somnolence/decreased responsiveness after starting baclofen and naproxen yesterday for neck pain by his PCP.  He has been admitted with acute toxic encephalopathy in the setting of baclofen use and continues to remain lethargic.  Assessment & Plan:   Principal Problem:   Acute encephalopathy Active Problems:   Essential hypertension   HLD (hyperlipidemia)   Type 2 diabetes mellitus (HCC)   Type 2 diabetes mellitus with diabetic neuropathy (HCC)   Hx of CABG   S/P colostomy takedown   Lytic bone lesions on xray   Metastatic cancer to bone (HCC)   Chronic kidney disease (CKD), stage IV (severe) (HCC)  Assessment and Plan:  Acute toxic encephalopathy-slowly improving - Secondary to recent baclofen use for neck pain, patient currently more responsive, but still very lethargic and is not eating - CT head with no acute findings - Continue to avoid sedating medications - Continue IV fluids until p.o. intake further improves   Leukocytosis-likely reactive - No obvious source of infection at the moment - UA unremarkable   Peripheral vascular disease - Continue aspirin  and Plavix    Essential hypertension-uncontrolled -Currently with elevated blood pressure readings - Continue Norvasc , losartan  and metoprolol  - IV hydralazine  and labetalol  as needed for significant elevations   Peripheral neuropathy - Hold Neurontin  for now until more awake and alert   Uncontrolled type 2  diabetes - Recent A1c 7.6% - SSI and carb modified diet   CAD - Continue aspirin  and Plavix  - Continued follow-up outpatient with cardiology   Suspected metastatic cancer to bone - Follows up with oncology Dr. Cheree Cords and last seen 6/11 with quantitative immunoglobulins pending and plan for repeat scan in 3-6 months   CKD stage IV - Creatinine currently at baseline - Continue to monitor    DVT prophylaxis:Heparin  Code Status: Full Family Communication: Wife at bedside 6/15 Disposition Plan:  Status is: Observation The patient will require care spanning > 2 midnights and should be moved to inpatient because: Need for IVF due to poor oral intake.  Consultants:  None  Procedures:  None  Antimicrobials:  None   Subjective: Patient seen and evaluated today and still appears lethargic and has not had much oral intake.  Blood pressures have been elevated overnight requiring use of labetalol .  Wife is currently at bedside and states that he is more easily arousable, but still falls asleep quite quickly.  Objective: Vitals:   01/27/24 0253 01/27/24 0447 01/27/24 0549 01/27/24 0654  BP: (!) 178/76 (!) 161/75 (!) 189/140 (!) 154/93  Pulse:  (!) 108 (!) 123 87  Resp:  16    Temp:  97.7 F (36.5 C) 98.2 F (36.8 C) 97.7 F (36.5 C)  TempSrc:  Oral Axillary Axillary  SpO2:  98% 99% 97%  Weight:      Height:        Intake/Output Summary (Last 24 hours) at 01/27/2024 1016 Last data filed at 01/27/2024 0300 Gross per 24 hour  Intake --  Output 600 ml  Net -600 ml  Filed Weights   01/26/24 1254 01/26/24 1256  Weight: 63 kg 62.6 kg    Examination:  General exam: Appears left urgent, but arouses to tactile stimulation Respiratory system: Clear to auscultation. Respiratory effort normal. Cardiovascular system: S1 & S2 heard, RRR.  Gastrointestinal system: Abdomen is soft Central nervous system: Somnolent, but arousable Extremities: No edema Skin: No significant  lesions noted    Data Reviewed: I have personally reviewed following labs and imaging studies  CBC: Recent Labs  Lab 01/23/24 1029 01/26/24 1251 01/26/24 1524 01/27/24 0246  WBC 10.5 15.9* 13.8* 13.8*  NEUTROABS 8.0*  --   --   --   HGB 8.8* 9.5* 8.9* 8.8*  HCT 27.9* 30.1* 27.5* 27.6*  MCV 80.6 79.8* 79.3* 79.3*  PLT 497* 544* 576* 539*   Basic Metabolic Panel: Recent Labs  Lab 01/26/24 1251 01/26/24 1524 01/27/24 0246  NA 134*  --  133*  K 4.7  --  4.4  CL 99  --  99  CO2 22  --  22  GLUCOSE 171*  --  138*  BUN 26*  --  24*  CREATININE 2.05* 2.12* 1.94*  CALCIUM  8.8*  --  8.5*  MG  --   --  2.2   GFR: Estimated Creatinine Clearance: 28.7 mL/min (A) (by C-G formula based on SCr of 1.94 mg/dL (H)). Liver Function Tests: Recent Labs  Lab 01/26/24 1251  AST 12*  ALT 11  ALKPHOS 126  BILITOT 0.7  PROT 7.3  ALBUMIN  3.2*   No results for input(s): LIPASE, AMYLASE in the last 168 hours. Recent Labs  Lab 01/26/24 1313  AMMONIA <13   Coagulation Profile: No results for input(s): INR, PROTIME in the last 168 hours. Cardiac Enzymes: No results for input(s): CKTOTAL, CKMB, CKMBINDEX, TROPONINI in the last 168 hours. BNP (last 3 results) No results for input(s): PROBNP in the last 8760 hours. HbA1C: No results for input(s): HGBA1C in the last 72 hours. CBG: Recent Labs  Lab 01/26/24 1623 01/26/24 2019 01/27/24 0017 01/27/24 0448 01/27/24 0751  GLUCAP 162* 136* 154* 188* 174*   Lipid Profile: No results for input(s): CHOL, HDL, LDLCALC, TRIG, CHOLHDL, LDLDIRECT in the last 72 hours. Thyroid  Function Tests: Recent Labs    01/26/24 1524  TSH 1.749   Anemia Panel: No results for input(s): VITAMINB12, FOLATE, FERRITIN, TIBC, IRON , RETICCTPCT in the last 72 hours. Sepsis Labs: Recent Labs  Lab 01/26/24 1253 01/26/24 1453  LATICACIDVEN 1.2 1.1    Recent Results (from the past 240 hours)  Blood culture  (routine x 2)     Status: None (Preliminary result)   Collection Time: 01/26/24  1:12 PM   Specimen: BLOOD  Result Value Ref Range Status   Specimen Description BLOOD BLOOD RIGHT ARM  Final   Special Requests   Final    BOTTLES DRAWN AEROBIC AND ANAEROBIC Blood Culture adequate volume Performed at [facility], [address] [place], [place] [address]    Culture PENDING  Incomplete   Report Status PENDING  Incomplete  Blood culture (routine x 2)     Status: None (Preliminary result)   Collection Time: 01/26/24  1:52 PM   Specimen: BLOOD  Result Value Ref Range Status   Specimen Description BLOOD BLOOD LEFT ARM  Final   Special Requests   Final    BOTTLES DRAWN AEROBIC AND ANAEROBIC Blood Culture adequate volume Performed at [facility], [address] [place], [place] [address]    Culture PENDING  Incomplete  Report Status PENDING  Incomplete         Radiology Studies: CT Head Wo Contrast Result Date: 01/26/2024 CLINICAL DATA:  Neck pain, somnolent, difficulty to arouse, altered level of consciousness EXAM: CT HEAD WITHOUT CONTRAST TECHNIQUE: Contiguous axial images were obtained from the base of the skull through the vertex without intravenous contrast. RADIATION DOSE REDUCTION: This exam was performed according to the departmental dose-optimization program which includes automated exposure control, adjustment of the mA and/or kV according to patient size and/or use of iterative reconstruction technique. COMPARISON:  01/03/2024 FINDINGS: Brain: No acute infarct or hemorrhage. Lateral ventricles and midline structures are stable. No acute extra-axial fluid collections. No mass effect. Vascular: No hyperdense vessel or unexpected calcification. Stable atherosclerosis. Skull: Normal. Negative for fracture or focal lesion. Sinuses/Orbits: Right mastoid effusion is again noted. Paranasal sinuses are clear. Other: None. IMPRESSION: 1. Stable head CT, no acute intracranial process.  Electronically Signed   By: Bobbye Burrow M.D.   On: 01/26/2024 13:44        Scheduled Meds:  amLODipine   10 mg Oral q AM   aspirin  EC  81 mg Oral Q breakfast   clopidogrel   75 mg Oral q AM   heparin   5,000 Units Subcutaneous Q8H   insulin  aspart  0-9 Units Subcutaneous Q4H   losartan   50 mg Oral Daily   metoprolol  tartrate  25 mg Oral BID   pantoprazole   40 mg Oral BID   Continuous Infusions:  sodium chloride        LOS: 0 days    Time spent: 55 minutes    Beverlyn Mcginness Loran Rock, DO [facility]  If 7PM-7AM, please contact night-coverage [email or web address] 01/27/2024, 10:16 AM

## 2024-01-27 NOTE — Progress Notes (Addendum)
 Pt is oriented to person on my shift. He will respond to me with simple one to two words, typically to pain or voice. Pt's wife at bedside. He has been very restless throughout the night, in and out of sleep. He has been NPO with me due to cognition, so Metoprolol  and Protonix  were both not given. At 2015 on 01/26/24, BP was 178/71 and Pulse was 93. MD Adefeso made aware, and PRN Hydralazine  was given Intravenously. Throughout the night, BP had a trending pattern of being high so another dose of Hydralazine  was given Intravenously at 0253 on 01/27/24. Pt was vomitting at approximately 0530 this morning, and Vitals show BP 189/140 with a Pulse of 123 at 0549. MD Adefeso made aware, no new orders at this time. Received order for Metoprolol  to be administered early, dosing is oral. MD aware that pt has not received oral meds entire shift.

## 2024-01-27 NOTE — Plan of Care (Signed)
  Problem: Clinical Measurements: Goal: Will remain free from infection Outcome: Progressing   Problem: Metabolic: Goal: Ability to maintain appropriate glucose levels will improve Outcome: Progressing   Problem: Skin Integrity: Goal: Risk for impaired skin integrity will decrease Outcome: Progressing

## 2024-01-27 NOTE — Progress Notes (Addendum)
 5mg  Labetalol  given intravenously at 0646 per MD Adefeso's order. Vitals at 0654 are Temp: 97.7, BP: 154/93 (103), Pulse: 87, SPO2: 97 RA.

## 2024-01-28 ENCOUNTER — Inpatient Hospital Stay (HOSPITAL_COMMUNITY)

## 2024-01-28 DIAGNOSIS — G9340 Encephalopathy, unspecified: Secondary | ICD-10-CM

## 2024-01-28 LAB — BASIC METABOLIC PANEL WITH GFR
Anion gap: 13 (ref 5–15)
BUN: 24 mg/dL — ABNORMAL HIGH (ref 8–23)
CO2: 21 mmol/L — ABNORMAL LOW (ref 22–32)
Calcium: 9 mg/dL (ref 8.9–10.3)
Chloride: 103 mmol/L (ref 98–111)
Creatinine, Ser: 1.85 mg/dL — ABNORMAL HIGH (ref 0.61–1.24)
GFR, Estimated: 37 mL/min — ABNORMAL LOW (ref >60)
Glucose, Bld: 169 mg/dL — ABNORMAL HIGH (ref 70–99)
Potassium: 4.2 mmol/L (ref 3.5–5.1)
Sodium: 137 mmol/L (ref 135–145)

## 2024-01-28 LAB — GLUCOSE, CAPILLARY
Glucose-Capillary: 141 mg/dL — ABNORMAL HIGH (ref 70–99)
Glucose-Capillary: 156 mg/dL — ABNORMAL HIGH (ref 70–99)
Glucose-Capillary: 159 mg/dL — ABNORMAL HIGH (ref 70–99)
Glucose-Capillary: 161 mg/dL — ABNORMAL HIGH (ref 70–99)
Glucose-Capillary: 185 mg/dL — ABNORMAL HIGH (ref 70–99)
Glucose-Capillary: 253 mg/dL — ABNORMAL HIGH (ref 70–99)

## 2024-01-28 LAB — CBC
HCT: 28.3 % — ABNORMAL LOW (ref 39.0–52.0)
Hemoglobin: 8.5 g/dL — ABNORMAL LOW (ref 13.0–17.0)
MCH: 24.4 pg — ABNORMAL LOW (ref 26.0–34.0)
MCHC: 30 g/dL (ref 30.0–36.0)
MCV: 81.3 fL (ref 80.0–100.0)
Platelets: 551 10*3/uL — ABNORMAL HIGH (ref 150–400)
RBC: 3.48 MIL/uL — ABNORMAL LOW (ref 4.22–5.81)
RDW: 17.8 % — ABNORMAL HIGH (ref 11.5–15.5)
WBC: 12.4 10*3/uL — ABNORMAL HIGH (ref 4.0–10.5)
nRBC: 0 % (ref 0.0–0.2)

## 2024-01-28 LAB — METHYLMALONIC ACID, SERUM: Methylmalonic Acid, Quantitative: 257 nmol/L (ref 0–378)

## 2024-01-28 LAB — MAGNESIUM: Magnesium: 2.2 mg/dL (ref 1.7–2.4)

## 2024-01-28 MED ORDER — SODIUM CHLORIDE 0.9 % IV SOLN: INTRAVENOUS | Status: DC

## 2024-01-28 MED ORDER — BUTALBITAL-APAP-CAFFEINE 50-325-40 MG PO TABS
1.0000 | ORAL_TABLET | ORAL | Status: DC | PRN
  Administered 2024-01-28 – 2024-01-29 (×4): 1 via ORAL
  Filled 2024-01-28 (×4): qty 1

## 2024-01-28 MED ORDER — MUSCLE RUB 10-15 % EX CREA
TOPICAL_CREAM | CUTANEOUS | Status: DC | PRN
  Filled 2024-01-28: qty 85

## 2024-01-28 NOTE — Plan of Care (Signed)
  Problem: Clinical Measurements: Goal: Will remain free from infection Outcome: Progressing   Problem: Clinical Measurements: Goal: Diagnostic test results will improve Outcome: Progressing   Problem: Clinical Measurements: Goal: Cardiovascular complication will be avoided Outcome: Progressing   

## 2024-01-28 NOTE — Progress Notes (Signed)
 PROGRESS NOTE    [name]  ZHY:[id number] DOB: 1945/12/30 DOA: 01/26/2024 [facility]CP: [name]   Brief Narrative:    [name] is a 78 y.o. male with medical history significant for CAD with prior CABG, hypertension, type 2 diabetes, dyslipidemia, CKD stage IV, diverticulitis status post colostomy, atrial tachycardia, peripheral vascular disease, TIA, and recent discharge on 6/5 after treatment for multifocal pneumonia with concern for aspiration who presented to the ED via EMS due to increased somnolence/decreased responsiveness after starting baclofen and naproxen yesterday for neck pain by his PCP.  He has been admitted with acute toxic encephalopathy in the setting of baclofen use and continues to remain lethargic, but is slowly improving.  He has noted to have some increased neck pain today and CT cervical spine has been ordered for further evaluation.  Assessment & Plan:   Principal Problem:   Acute encephalopathy Active Problems:   Essential hypertension   HLD (hyperlipidemia)   Type 2 diabetes mellitus (HCC)   Type 2 diabetes mellitus with diabetic neuropathy (HCC)   Hx of CABG   S/P colostomy takedown   Lytic bone lesions on xray   Metastatic cancer to bone (HCC)   Chronic kidney disease (CKD), stage IV (severe) (HCC)  Assessment and Plan:  Acute toxic encephalopathy-slowly improving - Secondary to recent baclofen use for neck pain, patient currently more responsive, but still very lethargic and is not eating - CT head with no acute findings - Continue to avoid sedating medications - Continue IV fluids until p.o. intake further improves  Chronic neck pain -CT cervical spine ordered for further evaluation   Leukocytosis-likely reactive - No obvious source of infection at the moment - UA unremarkable   Peripheral vascular disease - Continue aspirin  and Plavix    Essential hypertension-uncontrolled -Currently with elevated blood pressure readings -  Continue Norvasc , losartan  and metoprolol  - IV hydralazine  and labetalol  as needed for significant elevations   Peripheral neuropathy - Hold Neurontin  for now until more awake and alert   Uncontrolled type 2 diabetes - Recent A1c 7.6% - SSI and carb modified diet   CAD - Continue aspirin  and Plavix  - Continued follow-up outpatient with cardiology   Suspected metastatic cancer to bone - Follows up with oncology Dr. Cheree Cords and last seen 6/11 with quantitative immunoglobulins pending and plan for repeat scan in 3-6 months   CKD stage IV - Creatinine currently at baseline - Continue to monitor    DVT prophylaxis:Heparin  Code Status: Full Family Communication: Wife at bedside 6/16, spoke with daughter on phone 6/16 Disposition Plan:  Status is: Inpatient Remains inpatient appropriate because: Continued altered mentation and need for IV medications.   Consultants:  None  Procedures:  None  Antimicrobials:  None   Subjective: Patient seen and evaluated today and still somewhat lethargic, but becoming more alert and awake day by day.  He still does not have very adequate oral intake and is just starting to try and have some breakfast.  Wife at bedside is very concerned about his neck pain and would like imaging for further evaluation.  No acute overnight events noted.  Objective: Vitals:   01/27/24 2238 01/28/24 0103 01/28/24 0104 01/28/24 0449  BP: (!) 178/90 (!) 176/80 (!) 172/76 (!) 160/96  Pulse:  98  (!) 102  Resp:      Temp:  98 F (36.7 C)  (!) 97.5 F (36.4 C)  TempSrc:  Axillary  Axillary  SpO2:  98%  99%  Weight:  Height:        Intake/Output Summary (Last 24 hours) at 01/28/2024 1050 Last data filed at 01/28/2024 0730 Gross per 24 hour  Intake 711.25 ml  Output 1750 ml  Net -1038.75 ml   Filed Weights   01/26/24 1254 01/26/24 1256  Weight: 63 kg 62.6 kg    Examination:  General exam: Appears more awake, but still groggy Respiratory  system: Clear to auscultation. Respiratory effort normal. Cardiovascular system: S1 & S2 heard, RRR.  Gastrointestinal system: Abdomen is soft Central nervous system: More awake Extremities: No edema Skin: No significant lesions noted    Data Reviewed: I have personally reviewed following labs and imaging studies  CBC: Recent Labs  Lab 01/23/24 1029 01/26/24 1251 01/26/24 1524 01/27/24 0246 01/28/24 0311  WBC 10.5 15.9* 13.8* 13.8* 12.4*  NEUTROABS 8.0*  --   --   --   --   HGB 8.8* 9.5* 8.9* 8.8* 8.5*  HCT 27.9* 30.1* 27.5* 27.6* 28.3*  MCV 80.6 79.8* 79.3* 79.3* 81.3  PLT 497* 544* 576* 539* 551*   Basic Metabolic Panel: Recent Labs  Lab 01/26/24 1251 01/26/24 1524 01/27/24 0246 01/28/24 0311  NA 134*  --  133* 137  K 4.7  --  4.4 4.2  CL 99  --  99 103  CO2 22  --  22 21*  GLUCOSE 171*  --  138* 169*  BUN 26*  --  24* 24*  CREATININE 2.05* 2.12* 1.94* 1.85*  CALCIUM  8.8*  --  8.5* 9.0  MG  --   --  2.2 2.2   GFR: Estimated Creatinine Clearance: 30.1 mL/min (A) (by C-G formula based on SCr of 1.85 mg/dL (H)). Liver Function Tests: Recent Labs  Lab 01/26/24 1251  AST 12*  ALT 11  ALKPHOS 126  BILITOT 0.7  PROT 7.3  ALBUMIN  3.2*   No results for input(s): LIPASE, AMYLASE in the last 168 hours. Recent Labs  Lab 01/26/24 1313  AMMONIA <13   Coagulation Profile: No results for input(s): INR, PROTIME in the last 168 hours. Cardiac Enzymes: No results for input(s): CKTOTAL, CKMB, CKMBINDEX, TROPONINI in the last 168 hours. BNP (last 3 results) No results for input(s): PROBNP in the last 8760 hours. HbA1C: No results for input(s): HGBA1C in the last 72 hours. CBG: Recent Labs  Lab 01/27/24 2038 01/27/24 2041 01/28/24 0018 01/28/24 0429 01/28/24 0720  GLUCAP 137* 140* 185* 161* 141*   Lipid Profile: No results for input(s): CHOL, HDL, LDLCALC, TRIG, CHOLHDL, LDLDIRECT in the last 72 hours. Thyroid  Function  Tests: Recent Labs    01/26/24 1524  TSH 1.749   Anemia Panel: No results for input(s): VITAMINB12, FOLATE, FERRITIN, TIBC, IRON , RETICCTPCT in the last 72 hours. Sepsis Labs: Recent Labs  Lab 01/26/24 1253 01/26/24 1453  LATICACIDVEN 1.2 1.1    Recent Results (from the past 240 hours)  Blood culture (routine x 2)     Status: None (Preliminary result)   Collection Time: 01/26/24  1:12 PM   Specimen: BLOOD  Result Value Ref Range Status   Specimen Description BLOOD BLOOD RIGHT ARM  Final   Special Requests   Final    BOTTLES DRAWN AEROBIC AND ANAEROBIC Blood Culture adequate volume   Culture   Final    NO GROWTH 2 DAYS Performed at [facility], [address], [place], [place] [address]    Report Status PENDING  Incomplete  Blood culture (routine x 2)     Status: None (Preliminary result)  Collection Time: 01/26/24  1:52 PM   Specimen: BLOOD  Result Value Ref Range Status   Specimen Description BLOOD BLOOD LEFT ARM  Final   Special Requests   Final    BOTTLES DRAWN AEROBIC AND ANAEROBIC Blood Culture adequate volume   Culture   Final    NO GROWTH 2 DAYS Performed at [facility], [address] [place], [place] [address]    Report Status PENDING  Incomplete         Radiology Studies: CT Head Wo Contrast Result Date: 01/26/2024 CLINICAL DATA:  Neck pain, somnolent, difficulty to arouse, altered level of consciousness EXAM: CT HEAD WITHOUT CONTRAST TECHNIQUE: Contiguous axial images were obtained from the base of the skull through the vertex without intravenous contrast. RADIATION DOSE REDUCTION: This exam was performed according to the departmental dose-optimization program which includes automated exposure control, adjustment of the mA and/or kV according to patient size and/or use of iterative reconstruction technique. COMPARISON:  01/03/2024 FINDINGS: Brain: No acute infarct or hemorrhage. Lateral ventricles and midline structures are stable. No acute  extra-axial fluid collections. No mass effect. Vascular: No hyperdense vessel or unexpected calcification. Stable atherosclerosis. Skull: Normal. Negative for fracture or focal lesion. Sinuses/Orbits: Right mastoid effusion is again noted. Paranasal sinuses are clear. Other: None. IMPRESSION: 1. Stable head CT, no acute intracranial process. Electronically Signed   By: Bobbye Burrow M.D.   On: 01/26/2024 13:44        Scheduled Meds:  amLODipine   10 mg Oral q AM   aspirin  EC  81 mg Oral Q breakfast   clopidogrel   75 mg Oral q AM   heparin   5,000 Units Subcutaneous Q8H   insulin  aspart  0-9 Units Subcutaneous Q4H   losartan   50 mg Oral Daily   metoprolol  tartrate  25 mg Oral BID   pantoprazole   40 mg Oral BID     LOS: 1 day    Time spent: 55 minutes    Anthony Becvar Loran Rock, DO [facility]  If 7PM-7AM, please contact night-coverage [email or web address] 01/28/2024, 10:50 AM

## 2024-01-29 ENCOUNTER — Encounter: Payer: Self-pay | Admitting: Hematology

## 2024-01-29 ENCOUNTER — Ambulatory Visit

## 2024-01-29 DIAGNOSIS — G9340 Encephalopathy, unspecified: Secondary | ICD-10-CM | POA: Diagnosis not present

## 2024-01-29 LAB — BASIC METABOLIC PANEL WITH GFR
Anion gap: 13 (ref 5–15)
BUN: 20 mg/dL (ref 8–23)
CO2: 19 mmol/L — ABNORMAL LOW (ref 22–32)
Calcium: 8.7 mg/dL — ABNORMAL LOW (ref 8.9–10.3)
Chloride: 106 mmol/L (ref 98–111)
Creatinine, Ser: 1.54 mg/dL — ABNORMAL HIGH (ref 0.61–1.24)
GFR, Estimated: 46 mL/min — ABNORMAL LOW (ref >60)
Glucose, Bld: 125 mg/dL — ABNORMAL HIGH (ref 70–99)
Potassium: 3.4 mmol/L — ABNORMAL LOW (ref 3.5–5.1)
Sodium: 138 mmol/L (ref 135–145)

## 2024-01-29 LAB — GLUCOSE, CAPILLARY
Glucose-Capillary: 127 mg/dL — ABNORMAL HIGH (ref 70–99)
Glucose-Capillary: 129 mg/dL — ABNORMAL HIGH (ref 70–99)
Glucose-Capillary: 135 mg/dL — ABNORMAL HIGH (ref 70–99)

## 2024-01-29 LAB — IGG, IGA, IGM
IgA: 202 mg/dL (ref 61–437)
IgG (Immunoglobin G), Serum: 910 mg/dL (ref 603–1613)
IgM (Immunoglobulin M), Srm: 38 mg/dL (ref 15–143)

## 2024-01-29 LAB — CBC
HCT: 28.5 % — ABNORMAL LOW (ref 39.0–52.0)
Hemoglobin: 8.8 g/dL — ABNORMAL LOW (ref 13.0–17.0)
MCH: 24.8 pg — ABNORMAL LOW (ref 26.0–34.0)
MCHC: 30.9 g/dL (ref 30.0–36.0)
MCV: 80.3 fL (ref 80.0–100.0)
Platelets: 481 10*3/uL — ABNORMAL HIGH (ref 150–400)
RBC: 3.55 MIL/uL — ABNORMAL LOW (ref 4.22–5.81)
RDW: 17.6 % — ABNORMAL HIGH (ref 11.5–15.5)
WBC: 10.1 10*3/uL (ref 4.0–10.5)
nRBC: 0 % (ref 0.0–0.2)

## 2024-01-29 LAB — MAGNESIUM: Magnesium: 1.9 mg/dL (ref 1.7–2.4)

## 2024-01-29 MED ORDER — POTASSIUM CHLORIDE CRYS ER 20 MEQ PO TBCR
40.0000 meq | EXTENDED_RELEASE_TABLET | Freq: Two times a day (BID) | ORAL | Status: DC
  Administered 2024-01-29: 40 meq via ORAL
  Filled 2024-01-29: qty 2

## 2024-01-29 NOTE — Discharge Summary (Signed)
 Physician Discharge Summary  [name] ZOX:[id number] DOB: [birth year] DOA: 01/26/2024  PCP: [name]  Admit date: 01/26/2024  Discharge date: 01/29/2024  Admitted From:Home  Disposition:  Home  Recommendations for Outpatient Follow-up:  Follow up with PCP in 1-2 weeks Continue on medications as prior with the exception of baclofen and would generally avoid any further muscle relaxer use in the future Continue use of muscle rubs as well as topical anesthetics as well as NSAIDs for pain relief and avoid narcotics as well Okay to continue on gabapentin  as prior  Home Health: None  Equipment/Devices: None  Discharge Condition:Stable  CODE STATUS: Full  Diet recommendation: Heart Healthy/carb modified  Brief/Interim Summary: [name] is a 78 y.o. male with medical history significant for CAD with prior CABG, hypertension, type 2 diabetes, dyslipidemia, CKD stage IV, diverticulitis status post colostomy, atrial tachycardia, peripheral vascular disease, TIA, and recent discharge on 6/5 after treatment for multifocal pneumonia with concern for aspiration who presented to the ED via EMS due to increased somnolence/decreased responsiveness after starting baclofen and naproxen yesterday for neck pain by his PCP.  He had been admitted with acute toxic encephalopathy in the setting of baclofen use for neck strain/sprain.  He did have CT cervical spine on 6/16 with findings of degenerative disease, but no acute fractures or other significant findings.  He required hospitalization for several days to have further improvement in his condition and is now back to baseline and eating and drinking properly and able to ambulate with no concerns.   Discharge Diagnoses:  Principal Problem:   Acute encephalopathy Active Problems:   Essential hypertension   HLD (hyperlipidemia)   Type 2 diabetes mellitus (HCC)   Type 2 diabetes mellitus with diabetic neuropathy (HCC)   Hx of CABG    S/P colostomy takedown   Lytic bone lesions on xray   Metastatic cancer to bone (HCC)   Chronic kidney disease (CKD), stage IV (severe) (HCC)  Principal discharge diagnosis: Acute toxic encephalopathy secondary to baclofen use.  Discharge Instructions  Discharge Instructions     Diet - low sodium heart healthy   Complete by: As directed    Increase activity slowly   Complete by: As directed       Allergies as of 01/29/2024       Reactions   Acarbose Diarrhea        Medication List     STOP taking these medications    baclofen 10 MG tablet Commonly known as: LIORESAL       TAKE these medications    albuterol  108 (90 Base) MCG/ACT inhaler Commonly known as: VENTOLIN  HFA Inhale 2 puffs into the lungs every 6 (six) hours as needed for wheezing or shortness of breath.   amLODipine  10 MG tablet Commonly known as: NORVASC  Take 10 mg by mouth in the morning.   aspirin  EC 81 MG tablet Take 1 tablet (81 mg total) by mouth daily with breakfast.   clopidogrel  75 MG tablet Commonly known as: PLAVIX  Take 75 mg by mouth in the morning.   cyanocobalamin  1000 MCG tablet Commonly known as: VITAMIN B12 Take 1,000 mcg by mouth in the morning.   gabapentin  300 MG capsule Commonly known as: Neurontin  Take 1 capsule (300 mg total) by mouth 2 (two) times daily.   insulin  glargine 100 UNIT/ML injection Commonly known as: LANTUS  Inject 0.1 mLs (10 Units total) into the skin at bedtime. What changed: how much to take   losartan  50 MG  tablet Commonly known as: COZAAR  Take 50 mg by mouth daily.   metoprolol  tartrate 50 MG tablet Commonly known as: LOPRESSOR  Take 25 mg by mouth 2 (two) times daily.   naproxen 500 MG tablet Commonly known as: NAPROSYN Take 500 mg by mouth 2 (two) times daily.   pantoprazole  40 MG tablet Commonly known as: PROTONIX  Take 1 tablet (40 mg total) by mouth 2 (two) times daily.   Trelegy Ellipta 200-62.5-25 MCG/ACT Aepb Generic drug:  Fluticasone-Umeclidin-Vilant Inhale 2 puffs into the lungs daily.        Follow-up Information     [facility] Schedule an appointment as soon as possible for a visit in 1 week(s).   Contact information: [address] [facility] [place] [address] [phone]                Allergies  Allergen Reactions   Acarbose Diarrhea    Consultations: None   Procedures/Studies: CT CERVICAL SPINE WO CONTRAST Result Date: 01/28/2024 CLINICAL DATA:  Chronic, history of bony metastatic disease, neck pain EXAM: CT CERVICAL SPINE WITHOUT CONTRAST TECHNIQUE: Multidetector CT imaging of the cervical spine was performed without intravenous contrast. Multiplanar CT image reconstructions were also generated. RADIATION DOSE REDUCTION: This exam was performed according to the departmental dose-optimization program which includes automated exposure control, adjustment of the mA and/or kV according to patient size and/or use of iterative reconstruction technique. COMPARISON:  12/27/2023 FINDINGS: Alignment: Mild grade 1 degenerative anterolisthesis of C4 on C5. Otherwise alignment is anatomic. Skull base and vertebrae: No acute fracture. No primary bone lesion or focal pathologic process. Soft tissues and spinal canal: No prevertebral fluid or swelling. No visible canal hematoma. Disc levels: Mild spondylosis at C4-5 with moderate spondylosis at C5-6. Marked facet hypertrophic changes are seen from C2-3 through C5-6, left greater than right. There is bony encroachment of the neural foramina symmetrically at C3-4, with left predominant narrowing at C4-5 and right predominant narrowing at C5-6. Upper chest: Airway is patent. Visualized portions of the lung apices are clear. Other: Reconstructed images demonstrate no additional findings. IMPRESSION: 1. No acute cervical spine fracture.  No destructive bony lesions. 2. Multilevel cervical spondylosis and facet hypertrophy as above.  Electronically Signed   By: Bobbye Burrow M.D.   On: 01/28/2024 16:18   CT Head Wo Contrast Result Date: 01/26/2024 CLINICAL DATA:  Neck pain, somnolent, difficulty to arouse, altered level of consciousness EXAM: CT HEAD WITHOUT CONTRAST TECHNIQUE: Contiguous axial images were obtained from the base of the skull through the vertex without intravenous contrast. RADIATION DOSE REDUCTION: This exam was performed according to the departmental dose-optimization program which includes automated exposure control, adjustment of the mA and/or kV according to patient size and/or use of iterative reconstruction technique. COMPARISON:  01/03/2024 FINDINGS: Brain: No acute infarct or hemorrhage. Lateral ventricles and midline structures are stable. No acute extra-axial fluid collections. No mass effect. Vascular: No hyperdense vessel or unexpected calcification. Stable atherosclerosis. Skull: Normal. Negative for fracture or focal lesion. Sinuses/Orbits: Right mastoid effusion is again noted. Paranasal sinuses are clear. Other: None. IMPRESSION: 1. Stable head CT, no acute intracranial process. Electronically Signed   By: Bobbye Burrow M.D.   On: 01/26/2024 13:44   VAS US  AORTA/IVC/ILIACS Result Date: 01/22/2024 ABDOMINAL AORTA STUDY Patient Name:  [name]  Date of Exam:   01/22/2024 Medical Rec #: [id number]     Accession #:    [id number] Date of Birth: 09-Feb-1946     Patient Gender: M Patient Age:  78 years Exam Location:  [address] Procedure:      VAS US  AORTA/IVC/ILIACS Referring Phys: Jimmye Moulds --------------------------------------------------------------------------------  Indications: Follow up SMA stenosis, right CIA/EIA stents, right to left fem-fem              bypass graft. Risk Factors: Hypertension, hyperlipidemia, coronary artery disease. Vascular Interventions: 09/22/21: Right CIA and EIA angioplasty with CIA stent.                         08/09/16: Right iliofemoral endarterectomy.                          Bovine pericardial patch angioplasty, right common                         femoral artery.                         Redo right limb of right to left femoral-femoral bypass                         graft. Limitations: Patient discomfort.  Comparison Study: 03/28/23: SMA 540/183 cm/s                   07/10/23: Patent right CIA and EIA stents with elevated                   velocities in the EIA in the > 50% range. Patent right to left                   fem- fem BPG with no stenosis Performing Technologist: Helon Lobos RVT  Examination Guidelines: A complete evaluation includes B-mode imaging, spectral Doppler, color Doppler, and power Doppler as needed of all accessible portions of each vessel. Bilateral testing is considered an integral part of a complete examination. Limited examinations for reoccurring indications may be performed as noted.  Abdominal Aorta Findings: +-------------+-------+----------+----------+----------+--------+--------+ Location     AP (cm)Trans (cm)PSV (cm/s)Waveform  ThrombusComments +-------------+-------+----------+----------+----------+--------+--------+ RT CIA Prox                   176       biphasic                   +-------------+-------+----------+----------+----------+--------+--------+ RT CIA Mid                    289       biphasic                   +-------------+-------+----------+----------+----------+--------+--------+ RT CIA Distal                 315       monophasic                 +-------------+-------+----------+----------+----------+--------+--------+ RT EIA Prox                   508       biphasic                   +-------------+-------+----------+----------+----------+--------+--------+ RT EIA Mid                    325       biphasic                   +-------------+-------+----------+----------+----------+--------+--------+  RT EIA Distal                 303       biphasic                    +-------------+-------+----------+----------+----------+--------+--------+ SMA proximal: 520/146 cm/s mid: 114/23 cm/s distal: 74/23 cm/s. Right Graft #1: +---------------------+--------+---------------+----------+--------+ Right to left fem-femPSV cm/sStenosis       Waveform  Comments +---------------------+--------+---------------+----------+--------+ Inflow               209     50-74% stenosisbiphasic           +---------------------+--------+---------------+----------+--------+ Prox Anastomosis     168                    biphasic           +---------------------+--------+---------------+----------+--------+ Proximal Graft       79                     monophasic         +---------------------+--------+---------------+----------+--------+ Mid Graft            53                     biphasic           +---------------------+--------+---------------+----------+--------+ Distal Graft         56                     biphasic           +---------------------+--------+---------------+----------+--------+ Distal Anastomosis   73                     biphasic           +---------------------+--------+---------------+----------+--------+ Outflow              316     50-74% stenosisbiphasic           +---------------------+--------+---------------+----------+--------+   Summary: SMA: > 75% proximal SMA stenosis. Stenosis: Velocities throughout the right CIA and EIA are in the >50% range with the highest velocity at the proximal EIA. Right to left fem-fem graft: Patent graft with 50-74% stenosis (outflow > inflow). *See table(s) above for measurements and observations.  Electronically signed by Jimmye Moulds MD on 01/22/2024 at 12:56:22 PM.    Final    VAS US  ABI WITH/WO TBI Result Date: 01/22/2024  LOWER EXTREMITY DOPPLER STUDY Patient Name:  [name]  Date of Exam:   01/22/2024 Medical Rec #: [id number]     Accession #:    [id number] Date of Birth: 1946/04/25      Patient Gender: M Patient Age:   54 years Exam Location:  [address] Procedure:      VAS US  ABI WITH/WO TBI Referring Phys: Jimmye Moulds --------------------------------------------------------------------------------  Indications: Peripheral artery disease. High Risk Factors: Hypertension, hyperlipidemia, coronary artery disease. Other Factors:  Vascular Interventions: 09/22/21: Right CIA and EIA angioplasty with CIA stent.                         08/09/16: Right iliofemoral endarterectomy.                         Bovine pericardial patch angioplasty, right common  femoral artery.                         Redo right limb of right to left femoral-femoral bypass                         graft. Performing Technologist: Helon Lobos RVT  Examination Guidelines: A complete evaluation includes at minimum, Doppler waveform signals and systolic blood pressure reading at the level of bilateral brachial, anterior tibial, and posterior tibial arteries, when vessel segments are accessible. Bilateral testing is considered an integral part of a complete examination. Photoelectric Plethysmograph (PPG) waveforms and toe systolic pressure readings are included as required and additional duplex testing as needed. Limited examinations for reoccurring indications may be performed as noted.  ABI Findings: +---------+------------------+-----+-------------------+--------+ Right    Rt Pressure (mmHg)IndexWaveform           Comment  +---------+------------------+-----+-------------------+--------+ Brachial 188                                                +---------+------------------+-----+-------------------+--------+ ATA      105               0.56 monophasic                  +---------+------------------+-----+-------------------+--------+ PTA      132               0.70 dampened monophasic         +---------+------------------+-----+-------------------+--------+ Great Toe53                 0.28                             +---------+------------------+-----+-------------------+--------+ +---------+------------------+-----+-------------------+-------+ Left     Lt Pressure (mmHg)IndexWaveform           Comment +---------+------------------+-----+-------------------+-------+ Brachial 176                                               +---------+------------------+-----+-------------------+-------+ ATA      255               1.36 monophasic                 +---------+------------------+-----+-------------------+-------+ PTA      255               1.36 dampened monophasic        +---------+------------------+-----+-------------------+-------+ Great Toe34                0.18                            +---------+------------------+-----+-------------------+-------+ +-------+-----------+-----------+------------+------------+ ABI/TBIToday's ABIToday's TBIPrevious ABIPrevious TBI +-------+-----------+-----------+------------+------------+ Right  0.7        0.28       0.71        0.39         +-------+-----------+-----------+------------+------------+ Left   [place]         0.18       [place]          0.3          +-------+-----------+-----------+------------+------------+  Arterial wall calcification precludes accurate ankle pressures and ABIs. Previous ABI on 07/10/23.  Summary: Right: Resting right ankle-brachial index indicates moderate right lower extremity arterial disease. The right toe-brachial index is abnormal. Left: Resting left ankle-brachial index indicates noncompressible left lower extremity arteries. The left toe-brachial index is abnormal. *See table(s) above for measurements and observations.  Electronically signed by Jimmye Moulds MD on 01/22/2024 at 12:42:42 PM.    Final    IR BONE BIOPSY W IMG GUIDE Result Date: 01/18/2024 INDICATION: Abnormal MRI of the lumbosacral spine suspicious of metastatic disease. EXAM: FLUOROSCOPIC GUIDED  CORE BIOPSY AT L3 MEDICATIONS: None. ANESTHESIA/SEDATION: Moderate (conscious) sedation was employed during this procedure. A total of Versed  2 mg and Fentanyl  75 mcg was administered intravenously by the radiology nurse. Total intra-service moderate Sedation Time: 19 minutes. The patient's level of consciousness and vital signs were monitored continuously by radiology nursing throughout the procedure under my direct supervision. Fluoro time: 4 minutes 36 seconds.  325 mGy. COMPLICATIONS: None immediate. PROCEDURE: Informed written consent was obtained from the patient after a thorough discussion of the procedural risks, benefits and alternatives. All questions were addressed. Maximal Sterile Barrier Technique was utilized including caps, mask, sterile gowns, sterile gloves, sterile drape, hand hygiene and skin antiseptic. A timeout was performed prior to the initiation of the procedure. With the patient prone, the lumbosacral region was prepped and draped in the usual sterile fashion. The L3 vertebral body right pedicle was identified on AP and lateral projections. The overlying skin was then infiltrated with 0.25% bupivacaine  and overlying paraspinal musculature in the right pedicle. Using biplane intermittent fluoroscopy 2 passes were made with a 13 gauge Cook spinal needle and a 16 gauge core biopsy needle, and 1 pass with an 11 gauge Jamshidi needle and Kyphon osteo drill. Approximately 8 cc of mostly bloody thick aspirated with a few chunks of core tissue was obtained and sent for pathologic analysis. Hemostasis was achieved at the skin entry site. The patient tolerated the procedure well. There were no acute complications. Patient was then returned in stable and good condition. IMPRESSION: Status post fluoroscopic guided core biopsies at L3 via a right transpedicular approach with the retrieval of core tissue as described above. Electronically Signed   By: Luellen Sages M.D.   On: 01/18/2024 08:26   DG  Swallowing Func-Speech Pathology Result Date: 01/16/2024 Table formatting from the original result was not included. Modified Barium Swallow Study Patient Details Name: [name] MRN: [id number] Date of Birth: Dec 14, 1945 Today's Date: 01/16/2024 HPI/PMH: HPI: Pt is a 78 y.o. male with PMHx of PNA (2019, April 2025, and May 2025), Bell's Palsy (2019), previous esophogram 10/16/2017 was [facility] except for prominent CP bar. Daughter reports he was recently diagnosed with COPD (unable to verify in chart). Other medical hx includes coronary artery disease status post CABG, hypertension, diabetes mellitus type 2, hyperlipidemia, CKD stage IV, diverticulitis status post colostomy and subsequent takedown, atrial tachycardia, peripheral vascular disease, TIA. Pt recently in the hospital from 12/07/2023 - 12/10/2023 as well as 12/28/2023 - 01/03/2024 for treatment of PNA. Chest x-ray 01/13/24: Increased patchy opacities at the left mid to upper lung zone.  Similar streaky opacities in the right upper lung and slightly increased streaky right basilar opacities. These findings could reflect multifocal pneumonia, aspiration, or edema. Clinical Impression: Pt presents with oropharyngeal function that is [facility] with no observed aspiration across consistencies. Oral phase was timely and unremarkable. Flash penetration into laryngeal vestibule during the swallow observed with thin  liquids during consecutive sips administered via cup and straw. Slightly reduced base of tongue retraction leading to trace BOT residue. Slightly diminished pharyngeal stripping wave resulting in trace pharyngeal residue in the valleculae, which cleared with subsequent reflexive swallow. Hyolaryngeal excusion was intact. Esophageal sweep was limited but unremarkable. Recommend continue regular textures with thin liquids with safe swallow precautions of small bites/sips, slow rate, alternate solids and liquids, upright for all PO and for at least 30 minute after PO.  Factors that may increase risk of adverse event in presence of aspiration Roderick Civatte & Jessy Morocco 2021): Factors that may increase risk of adverse event in presence of aspiration Roderick Civatte & Jessy Morocco 2021): Poor general health and/or compromised immunity; Respiratory or GI disease Recommendations/Plan: Swallowing Evaluation Recommendations Swallowing Evaluation Recommendations Recommendations: PO diet Liquid Administration via: Cup; Straw Medication Administration: Whole meds with liquid Supervision: Patient able to self-feed Swallowing strategies  : Slow rate; Small bites/sips; Follow solids with liquids Postural changes: Position pt fully upright for meals; Stay upright 30-60 min after meals Oral care recommendations: Oral care BID (2x/day) Treatment Plan Treatment Plan Treatment recommendations: No treatment recommended at this time Follow-up recommendations: No SLP follow up Functional status assessment: Patient has had a recent decline in their functional status and demonstrates the ability to make significant improvements in function in a reasonable and predictable amount of time. Recommendations Recommendations for follow up therapy are one component of a multi-disciplinary discharge planning process, led by the attending physician.  Recommendations may be updated based on patient status, additional functional criteria and insurance authorization. Assessment: Orofacial Exam: Orofacial Exam Oral Cavity: Oral Hygiene: WFL Oral Cavity - Dentition: Missing dentition (partials on top (not present for study)) Orofacial Anatomy: WFL Oral Motor/Sensory Function: WFL Anatomy: Anatomy: Other (Comment) (hx of CP bar) Boluses Administered: Boluses Administered Boluses Administered: Thin liquids (Level 0); Mildly thick liquids (Level 2, nectar thick); Moderately thick liquids (Level 3, honey thick); Puree; Solid (Barium pill)  Oral Impairment Domain: Oral Impairment Domain Lip Closure: No labial escape Tongue control during bolus  hold: Cohesive bolus between tongue to palatal seal Bolus preparation/mastication: Timely and efficient chewing and mashing Bolus transport/lingual motion: Brisk tongue motion Oral residue: Trace residue lining oral structures Location of oral residue : Tongue (base of tongue) Initiation of pharyngeal swallow : Pyriform sinuses; Posterior angle of the ramus Pharyngeal Impairment Domain: Pharyngeal Impairment Domain Soft palate elevation: No bolus between soft palate (SP)/pharyngeal wall (PW) Laryngeal elevation: Complete superior movement of thyroid  cartilage with complete approximation of arytenoids to epiglottic petiole Anterior hyoid excursion: Partial anterior movement Epiglottic movement: Complete inversion Laryngeal vestibule closure: Incomplete, narrow column air/contrast in laryngeal vestibule Pharyngeal stripping wave : Present - diminished Pharyngeal contraction (A/P view only): N/A Pharyngoesophageal segment opening: Complete distension and complete duration, no obstruction of flow Tongue base retraction: Trace column of contrast or air between tongue base and PPW Pharyngeal residue: Trace residue within or on pharyngeal structures Location of pharyngeal residue: Valleculae; Tongue base  Esophageal Impairment Domain: Esophageal Impairment Domain Esophageal clearance upright position: Complete clearance, esophageal coating (Limited view with sweep) Pill: Pill Consistency administered: Thin liquids (Level 0) Penetration/Aspiration Scale Score: Penetration/Aspiration Scale Score 1.  Material does not enter airway: Pill; Solid; Puree; Moderately thick liquids (Level 3, honey thick); Mildly thick liquids (Level 2, nectar thick); Thin liquids (Level 0) 2.  Material enters airway, remains ABOVE vocal cords then ejected out: Thin liquids (Level 0) Compensatory Strategies: No data recorded  General Information: Caregiver present: No  Diet Prior to  this Study: Regular; Thin liquids (Level 0)   Temperature : Normal    Respiratory Status: WFL   Supplemental O2: None (Room air)   History of Recent Intubation: No  Behavior/Cognition: Alert; Cooperative; Pleasant mood Self-Feeding Abilities: Able to self-feed Baseline vocal quality/speech: Normal Volitional Cough: Able to elicit Volitional Swallow: Able to elicit No data recorded Pain: Pain Assessment Pain Assessment: No/denies pain End of Session: Start Time:SLP Start Time (ACUTE ONLY): 1425 Stop Time: SLP Stop Time (ACUTE ONLY): 1505 Time Calculation:SLP Time Calculation (min) (ACUTE ONLY): 40 min Charges: SLP Evaluations $ SLP Speech Visit: 1 Visit SLP Evaluations $MBS Swallow: 1 Procedure SLP visit diagnosis: SLP Visit Diagnosis: Dysphagia, unspecified (R13.10) Past Medical History: Past Medical History: Diagnosis Date  Anemia   CAD S/P percutaneous coronary angioplasty 1998  PCI TO CX  CKD (chronic kidney disease) stage 3, GFR 30-59 ml/min (HCC)   Diabetes mellitus without complication (HCC)   Diverticulitis 08/16/2017  hospitalized with diverticulitis/sepsis  GERD (gastroesophageal reflux disease)   occassionally  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  Hypercholesteremia   Hypertension   Neuropathy   Peripheral vascular disease (HCC)   s/p R-L FEM-FEM BYPASS  Pneumonia   Tachycardia   after CABG, pt on medicine for this Past Surgical History: Past Surgical History: Procedure Laterality Date  ABDOMINAL AORTOGRAM Bilateral 09/19/2016  Procedure: iliac;  Surgeon: Margherita Shell, MD;  [facility];  Service: Cardiovascular;  Laterality: Bilateral;  ABDOMINAL AORTOGRAM W/LOWER EXTREMITY N/A 09/22/2021  Procedure: ABDOMINAL AORTOGRAM W/LOWER EXTREMITY;  Surgeon: Young Hensen, MD;  [facility];  Service: Cardiovascular;  Laterality: N/A;  ABDOMINAL AORTOGRAM W/LOWER EXTREMITY N/A 10/05/2022  Procedure: ABDOMINAL AORTOGRAM W/LOWER EXTREMITY;  Surgeon: Young Hensen, MD;  [facility];  Service: Cardiovascular;  Laterality: N/A;   BIOPSY  05/16/2023  Procedure: BIOPSY;  Surgeon: Suzette Espy, MD;  [facility];  Service: Endoscopy;;  CARDIAC CATHETERIZATION  2003  with stent  CARDIAC CATHETERIZATION N/A 10/11/2015  Procedure: Left Heart Cath and Coronary Angiography;  Surgeon: Peter M Swaziland, MD;  Location: [facility] INV[facility] CV LAB;  Service: Cardiovascular;  Laterality: N/A;  COLON RESECTION N/A 12/13/2017  Procedure: EXPLORATORY LAPAROTOMY, SIGMOID COLECTOMY WITH COLOSTOMY;  Surgeon: Dareen Ebbing, MD;  [facility]: [facility] OR;  Service: General;  Laterality: N/A;  COLONOSCOPY N/A 09/22/2013  Procedure: COLONOSCOPY;  Surgeon: Suzette Espy, MD;  [facility];  Service: Endoscopy;  Laterality: N/A;  9:30 AM  COLONOSCOPY N/A 02/19/2018  Procedure: COLONOSCOPY;  Surgeon: Suzette Espy, MD;  [facility];  Service: Endoscopy;  Laterality: N/A;  2:00pm  COLONOSCOPY WITH PROPOFOL  N/A 05/16/2023  Procedure: COLONOSCOPY WITH PROPOFOL ;  Surgeon: Suzette Espy, MD;  [facility];  Service: Endoscopy;  Laterality: N/A;  730am, asa 3  COLOSTOMY N/A 12/13/2017  Procedure: COLOSTOMY;  Surgeon: Dareen Ebbing, MD;  Location: [facility] OR;  Service: General;  Laterality: N/A;  COLOSTOMY REVERSAL N/A 03/26/2018  Procedure: COLOSTOMY REVERSAL;  Surgeon: Dareen Ebbing, MD;  [facility];  Service: General;  Laterality: N/A;  CORONARY ARTERY BYPASS GRAFT N/A 10/18/2015  Procedure: CORONARY ARTERY BYPASS GRAFTING (CABG) x  five, using left internal mammary artery and right leg greater saphenous vein harvested endoscopically;  Surgeon: Zelphia Higashi, MD;  [facility];  Service: Open Heart Surgery;  Laterality: N/A;  ENDARTERECTOMY FEMORAL Right 08/09/2016  Procedure: ENDARTERECTOMY FEMORAL WITH VEIN PATCH ANGIOPLASTY;  Surgeon: Margherita Shell, MD;  [facility];  Service: Vascular;  Laterality: Right;  ESOPHAGOGASTRODUODENOSCOPY N/A 10/24/2017  Dr. Riley Cheadle: hiatal hernia  ESOPHAGOGASTRODUODENOSCOPY N/A 01/01/2024   Procedure: EGD (ESOPHAGOGASTRODUODENOSCOPY);  Surgeon: Hargis Lias, MD;  [facility];  Service: Endoscopy;  Laterality: N/A;  ESOPHAGOGASTRODUODENOSCOPY (EGD) WITH PROPOFOL  N/A 05/16/2023  Procedure: ESOPHAGOGASTRODUODENOSCOPY (EGD) WITH PROPOFOL ;  Surgeon: Suzette Espy, MD;  [facility];  Service: Endoscopy;  Laterality: N/A;  FEMORAL-FEMORAL BYPASS GRAFT Bilateral 08/09/2016  Procedure: REVISION BYPASS GRAFT RIGHT FEMORAL-LEFT FEMORAL ARTERY;  Surgeon: Margherita Shell, MD;  Location: [facility] OR;  Service: Vascular;  Laterality: Bilateral;  FEMORAL-POPLITEAL BYPASS GRAFT    INTRAMEDULLARY (IM) NAIL INTERTROCHANTERIC Left 04/03/2021  Procedure: LEFT  HIP INTRAMEDULLARY (IM) NAIL INTERTROCHANTRIC;  Surgeon: Arnie Lao, MD;  [facility];  Service: Orthopedics;  Laterality: Left;  IR FLUORO GUIDED NEEDLE PLC ASPIRATION/INJECTION LOC  12/26/2023  MALONEY DILATION N/A 05/16/2023  Procedure: MALONEY DILATION;  Surgeon: Suzette Espy, MD;  [facility];  Service: Endoscopy;  Laterality: N/A;  PERIPHERAL VASCULAR BALLOON ANGIOPLASTY Right 09/22/2021  Procedure: PERIPHERAL VASCULAR BALLOON ANGIOPLASTY;  Surgeon: Young Hensen, MD;  [facility];  Service: Cardiovascular;  Laterality: Right;  PERIPHERAL VASCULAR CATHETERIZATION N/A 05/08/2016  Procedure: Lower Extremity Angiography;  Surgeon: Avanell Leigh, MD;  Location: [facility] INVASIVE CV LAB;  Service: Cardiovascular;  Laterality: N/A;  PERIPHERAL VASCULAR INTERVENTION Right 09/19/2016  Procedure: Peripheral Vascular Intervention;  Surgeon: Margherita Shell, MD;  [facility];  Service: Cardiovascular;  Laterality: Right;  ext iliac stent  PERIPHERAL VASCULAR INTERVENTION Right 09/22/2021  Procedure: PERIPHERAL VASCULAR INTERVENTION;  Surgeon: Young Hensen, MD;  [facility];  Service: Cardiovascular;  Laterality: Right;  POLYPECTOMY  02/19/2018  Procedure:  POLYPECTOMY;  Surgeon: Suzette Espy, MD;  [facility];  Service: Endoscopy;;  PROCTOSCOPY  10/24/2017  Procedure: PROCTOSCOPY;  Surgeon: Suzette Espy, MD;  [facility];  Service: Endoscopy;;  TEE WITHOUT CARDIOVERSION N/A 10/18/2015  Procedure: TRANSESOPHAGEAL ECHOCARDIOGRAM (TEE);  Surgeon: Zelphia Higashi, MD;  Location[facility] OR;  Service: Open Heart Surgery;  Laterality: N/A; Caretha Chapel, MA CCC-SLP Speech-Language Pathologist 01/16/2024, 3:53 PM  DG Chest Port 1 View Result Date: 01/13/2024 CLINICAL DATA:  Fever.  Decreased lung sounds. EXAM: PORTABLE CHEST 1 VIEW COMPARISON:  01/01/2024. FINDINGS: Stable cardiomediastinal silhouette. Prior median sternotomy and CABG. Increased patchy opacities at the left mid to upper lung zone. Similar streaky opacities in the right upper lung and slightly increased streaky right basilar opacities. No large pleural effusion. No pneumothorax. Known bilateral rib fractures, better evaluated on the prior CT. IMPRESSION: Increased patchy opacities at the left mid to upper lung zone. Similar streaky opacities in the right upper lung and slightly increased streaky right basilar opacities. These findings could reflect multifocal pneumonia, aspiration, or edema. Electronically Signed   By: Mannie Seek M.D.   On: 01/13/2024 16:24   CT HEAD WO CONTRAST ( ) Result Date: 01/03/2024 CLINICAL DATA:  Head trauma, minor (Age >= 65y).  Fall EXAM: CT HEAD WITHOUT CONTRAST TECHNIQUE: Contiguous axial images were obtained from the base of the skull through the vertex without intravenous contrast. RADIATION DOSE REDUCTION: This exam was performed according to the departmental dose-optimization program which includes automated exposure control, adjustment of the mA and/or kV according to patient size and/or use of iterative reconstruction technique. COMPARISON:  12/10/2023 FINDINGS: Brain: Cavum septum pellucidum. Moderate global parenchymal volume loss  is stable since prior examination. Mild periventricular white matter changes are present likely reflecting the sequela of small vessel ischemia. 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 No abnormal intra or extra-axial mass lesion or fluid collection. No abnormal mass effect or midline shift. No evidence of acute intracranial hemorrhage or infarct. Ventricular size is normal. Cerebellum unremarkable. Vascular: No asymmetric hyperdense vasculature at the skull base. Skull: Intact Sinuses/Orbits: Paranasal sinuses are clear. Orbits are unremarkable. Other: Dense fluid opacification of the right mastoid air cells and middle ear cavity. No associated osseous erosion or coalescence. Left mastoid air cells are clear. Small fluid noted within the left middle ear cavity. IMPRESSION: 1. No acute intracranial abnormality. No calvarial fracture. 2. Stable moderate global parenchymal volume loss and remote right frontal periventricular white matter infarct. 3. Dense fluid opacification of the right mastoid air cells and middle ear cavity. No associated osseous erosion or coalescence. Small fluid noted within the left middle ear cavity. Electronically Signed   By: Worthy Heads M.D.   On: 01/03/2024 01:16   NM Pulmonary Perfusion Result Date: 01/02/2024 CLINICAL DATA:  Hypoxia, tachycardia, metastatic cancer EXAM: NUCLEAR MEDICINE PERFUSION LUNG SCAN TECHNIQUE: Perfusion images were obtained in multiple projections after intravenous injection of radiopharmaceutical. Ventilation scans intentionally deferred if perfusion scan and chest x-ray adequate for interpretation during COVID 19 epidemic. RADIOPHARMACEUTICALS:  4.4 mCi Tc-13m MAA IV COMPARISON:  Chest radiograph 01/01/2024 FINDINGS: Perfusion imaging demonstrates no segmental or subsegmental perfusion abnormalities. IMPRESSION: No segmental or subsegmental perfusion abnormalities to suggest pulmonary embolism. Electronically Signed   By:  Rozell Cornet M.D.   On: 01/02/2024 14:29   DG CHEST PORT 1 VIEW Result Date: 01/01/2024 CLINICAL DATA:  Hypoxia EXAM: PORTABLE CHEST 1 VIEW COMPARISON:  Chest radiograph dated 12/28/2023 FINDINGS: Normal lung volumes. Patchy right upper and left basilar opacities. No pleural effusion or pneumothorax. Similar mildly enlarged cardiomediastinal silhouette. Median sternotomy wires are nondisplaced. Bilateral rib fractures, better evaluated on prior CT. IMPRESSION: Patchy right upper and left basilar opacities, which may represent atelectasis, aspiration, or pneumonia. Electronically Signed   By: Limin  Xu M.D.   On: 01/01/2024 20:27     Discharge Exam: Vitals:   01/29/24 0559 01/29/24 0618  BP: (!) 172/78 (!) 167/68  Pulse: 73   Resp:    Temp:    SpO2: 99% 100%   Vitals:   01/29/24 0421 01/29/24 0550 01/29/24 0559 01/29/24 0618  BP: (!) 184/73 (!) 180/69 (!) 172/78 (!) 167/68  Pulse:  79 73   Resp:      Temp:      TempSrc:      SpO2:   99% 100%  Weight:      Height:        General: Pt is alert, awake, not in acute distress Cardiovascular: RRR, S1/S2 +, no rubs, no gallops Respiratory: CTA bilaterally, no wheezing, no rhonchi Abdominal: Soft, NT, ND, bowel sounds + Extremities: no edema, no cyanosis    The results of significant diagnostics from this hospitalization (including imaging, microbiology, ancillary and laboratory) are listed below for reference.     Microbiology: Recent Results (from the past 240 hours)  Blood culture (routine x 2)     Status: None (Preliminary result)   Collection Time: 01/26/24  1:12 PM   Specimen: BLOOD  Result Value Ref Range Status   Specimen Description BLOOD BLOOD RIGHT ARM  Final   Special Requests   Final    BOTTLES DRAWN AEROBIC AND ANAEROBIC Blood Culture  adequate volume   Culture   Final    NO GROWTH 3 DAYS Performed at [facility], [address], [place], [place] [address]    Report Status PENDING  Incomplete  Blood culture  (routine x 2)     Status: None (Preliminary result)   Collection Time: 01/26/24  1:52 PM   Specimen: BLOOD  Result Value Ref Range Status   Specimen Description BLOOD BLOOD LEFT ARM  Final   Special Requests   Final    BOTTLES DRAWN AEROBIC AND ANAEROBIC Blood Culture adequate volume   Culture   Final    NO GROWTH 3 DAYS Performed at [facility], [address], [place], [place] [address]    Report Status PENDING  Incomplete     Labs: BNP (last 3 results) Recent Labs    12/10/23 1700 12/28/23 0706  BNP 30.0 54.0   Basic Metabolic Panel: Recent Labs  Lab 01/26/24 1251 01/26/24 1524 01/27/24 0246 01/28/24 0311 01/29/24 0445  NA 134*  --  133* 137 138  K 4.7  --  4.4 4.2 3.4*  CL 99  --  99 103 106  CO2 22  --  22 21* 19*  GLUCOSE 171*  --  138* 169* 125*  BUN 26*  --  24* 24* 20  CREATININE 2.05* 2.12* 1.94* 1.85* 1.54*  CALCIUM  8.8*  --  8.5* 9.0 8.7*  MG  --   --  2.2 2.2 1.9   Liver Function Tests: Recent Labs  Lab 01/26/24 1251  AST 12*  ALT 11  ALKPHOS 126  BILITOT 0.7  PROT 7.3  ALBUMIN  3.2*   No results for input(s): LIPASE, AMYLASE in the last 168 hours. Recent Labs  Lab 01/26/24 1313  AMMONIA <13   CBC: Recent Labs  Lab 01/23/24 1029 01/26/24 1251 01/26/24 1524 01/27/24 0246 01/28/24 0311 01/29/24 0445  WBC 10.5 15.9* 13.8* 13.8* 12.4* 10.1  NEUTROABS 8.0*  --   --   --   --   --   HGB 8.8* 9.5* 8.9* 8.8* 8.5* 8.8*  HCT 27.9* 30.1* 27.5* 27.6* 28.3* 28.5*  MCV 80.6 79.8* 79.3* 79.3* 81.3 80.3  PLT 497* 544* 576* 539* 551* 481*   Cardiac Enzymes: No results for input(s): CKTOTAL, CKMB, CKMBINDEX, TROPONINI in the last 168 hours. BNP: Invalid input(s): POCBNP CBG: Recent Labs  Lab 01/28/24 1644 01/28/24 1955 01/29/24 0016 01/29/24 0409 01/29/24 0726  GLUCAP 156* 159* 129* 127* 135*   D-Dimer No results for input(s): DDIMER in the last 72 hours. Hgb A1c No results for input(s): HGBA1C in the last 72  hours. Lipid Profile No results for input(s): CHOL, HDL, LDLCALC, TRIG, CHOLHDL, LDLDIRECT in the last 72 hours. Thyroid  function studies Recent Labs    01/26/24 1524  TSH 1.749   Anemia work up No results for input(s): VITAMINB12, FOLATE, FERRITIN, TIBC, IRON , RETICCTPCT in the last 72 hours. Urinalysis    Component Value Date/Time   COLORURINE STRAW (A) 01/26/2024 2300   APPEARANCEUR CLEAR 01/26/2024 2300   LABSPEC 1.009 01/26/2024 2300   PHURINE 8.0 01/26/2024 2300   GLUCOSEU >=500 (A) 01/26/2024 2300   HGBUR NEGATIVE 01/26/2024 2300   BILIRUBINUR NEGATIVE 01/26/2024 2300   KETONESUR NEGATIVE 01/26/2024 2300   PROTEINUR 30 (A) 01/26/2024 2300   UROBILINOGEN 0.2 03/16/2015 0120   NITRITE NEGATIVE 01/26/2024 2300   LEUKOCYTESUR NEGATIVE 01/26/2024 2300   Sepsis Labs Recent Labs  Lab 01/26/24 1524 01/27/24 0246 01/28/24 0311 01/29/24 0445  WBC 13.8* 13.8* 12.4*  10.1   Microbiology Recent Results (from the past 240 hours)  Blood culture (routine x 2)     Status: None (Preliminary result)   Collection Time: 01/26/24  1:12 PM   Specimen: BLOOD  Result Value Ref Range Status   Specimen Description BLOOD BLOOD RIGHT ARM  Final   Special Requests   Final    BOTTLES DRAWN AEROBIC AND ANAEROBIC Blood Culture adequate volume   Culture   Final    NO GROWTH 3 DAYS Performed at [facility], [address], [place], [place] [address]    Report Status PENDING  Incomplete  Blood culture (routine x 2)     Status: None (Preliminary result)   Collection Time: 01/26/24  1:52 PM   Specimen: BLOOD  Result Value Ref Range Status   Specimen Description BLOOD BLOOD LEFT ARM  Final   Special Requests   Final    BOTTLES DRAWN AEROBIC AND ANAEROBIC Blood Culture adequate volume   Culture   Final    NO GROWTH 3 DAYS Performed at [facility], [address] [place], [place] [address]    Report Status PENDING  Incomplete     Time coordinating discharge: 35  minutes  SIGNED:   Cornelius Dill, DO [facility] 01/29/2024, 10:39 AM  If 7PM-7AM, please contact night-coverage [email or web address]

## 2024-01-29 NOTE — Progress Notes (Signed)
 Mobility Specialist Progress Note:    01/29/24 1013  Mobility  Activity Ambulated with assistance in hallway  Level of Assistance Minimal assist, patient does 75% or more  Assistive Device None;Other (Comment) (IV pole)  Distance Ambulated (ft) 80 ft  Range of Motion/Exercises Active;All extremities  Activity Response Tolerated well  Mobility Referral Yes  Mobility visit 1 Mobility  Mobility Specialist Start Time (ACUTE ONLY) [id number]  Mobility Specialist Stop Time (ACUTE ONLY) 1013  Mobility Specialist Time Calculation (min) (ACUTE ONLY) 19 min   RN requested O2 eval during ambulation. Required MinA to stand and ambulate while using IV pole for stability. Tolerated well, SpO2 97% on RA throughout. Returned to room, daughter at bedside. All needs met.   [name] Mobility Specialist Please contact via Special educational needs teacher or  Rehab office at [phone]

## 2024-01-29 NOTE — TOC Transition Note (Signed)
 Transition of Care [facility]) - Discharge Note   Patient Details  Name: [name] MRN: [id number] Date of Birth: 1945/09/21  [facility] CM/SW Contact:  Orelia Binet, RN Phone Number: 01/29/2024, 11:18 AM   Clinical Narrative:   Patient discharging home. Live home with his spouse, drives himself to appointment, does not use any DME. PT has no follow up recommendations.     Final next level of care: Home/Self Care Barriers to Discharge: Barriers Resolved   Patient Goals and CMS Choice Patient states their goals for this hospitalization and ongoing recovery are:: return home CMS Medicare.gov Compare Post Acute Care list provided to:: Patient Choice offered to / list presented to : Patient      Discharge Placement   Patient and family notified of of transfer: 01/29/24  Discharge Plan and Services Additional resources added to the After Visit Summary for         Social Drivers of Health (SDOH) Interventions SDOH Screenings   Food Insecurity: No Food Insecurity (01/28/2024)  Recent Concern: Food Insecurity - Food Insecurity Present (12/18/2023)  Housing: Low Risk  (01/28/2024)  Transportation Needs: No Transportation Needs (01/28/2024)  Utilities: Not At Risk (01/28/2024)  Depression (PHQ2-9): Low Risk  (12/18/2023)  Financial Resource Strain: Low Risk  (10/08/2023)   [facility]  Social Connections: Socially Integrated (01/28/2024)  Tobacco Use: Medium Risk (01/13/2024)  Health Literacy: High Risk (10/08/2023)   [facility] from [facility]     Readmission Risk Interventions    01/29/2024   11:17 AM 01/14/2024    4:29 PM  Readmission Risk Prevention Plan  Transportation Screening Complete Complete  PCP or Specialist Appt within 3-5 Days Complete Complete  HRI or Home Care Consult Complete Complete  Social Work Consult for Recovery Care Planning/Counseling  Complete  Palliative Care Screening Not Applicable Not Applicable  Medication Review  Oceanographer) Complete Complete

## 2024-01-29 NOTE — Plan of Care (Signed)
  Problem: Education: Goal: Knowledge of General Education information will improve Description: Including pain rating scale, medication(s)/side effects and non-pharmacologic comfort measures Outcome: Progressing   Problem: Clinical Measurements: Goal: Ability to maintain clinical measurements within normal limits will improve Outcome: Progressing   Problem: Activity: Goal: Risk for activity intolerance will decrease Outcome: Progressing   Problem: Coping: Goal: Level of anxiety will decrease Outcome: Progressing   Problem: Pain Managment: Goal: General experience of comfort will improve and/or be controlled Outcome: Progressing   Problem: Safety: Goal: Ability to remain free from injury will improve Outcome: Progressing   Problem: Skin Integrity: Goal: Risk for impaired skin integrity will decrease Outcome: Progressing   Problem: Fluid Volume: Goal: Ability to maintain a balanced intake and output will improve Outcome: Progressing   Problem: Metabolic: Goal: Ability to maintain appropriate glucose levels will improve Outcome: Progressing

## 2024-01-29 NOTE — Progress Notes (Signed)
 PT Cancellation Note  Patient Details Name: [name] MRN: [id number] DOB: October 26, 1945   Cancelled Treatment:    Reason Eval/Treat Not Completed: PT screened, no needs identified, will sign off. Patient walking safely in room, hallways using RW.   12:03 PM, 01/29/24 Walton Guppy, MPT Physical Therapist with [facility] 336 [phone] office [phone] mobile phone

## 2024-01-30 ENCOUNTER — Inpatient Hospital Stay

## 2024-01-31 ENCOUNTER — Telehealth: Payer: Self-pay | Admitting: *Deleted

## 2024-01-31 ENCOUNTER — Other Ambulatory Visit: Payer: Self-pay | Admitting: *Deleted

## 2024-01-31 LAB — CULTURE, BLOOD (ROUTINE X 2)
Culture: NO GROWTH
Culture: NO GROWTH
Special Requests: ADEQUATE
Special Requests: ADEQUATE

## 2024-01-31 NOTE — Telephone Encounter (Signed)
 Received call from Daughter [name] stating that patient has had a headache since discharge.  She was under the impression that biopsy done 6/3 was from his spine and was concerned that he had leakage of spinal fluid.  Explained that bmbx was taken from hip and was not related.  He did go to PCP today and was given a medication in the office for, what they described to be a migraine.  She is unsure if it was effective.  Advised to follow up with PCP by telephone as planned this afternoon.  Verbalized understanding.

## 2024-02-05 ENCOUNTER — Inpatient Hospital Stay

## 2024-02-05 VITALS — BP 165/70 | HR 83 | Temp 96.9°F | Resp 18

## 2024-02-05 DIAGNOSIS — D508 Other iron deficiency anemias: Secondary | ICD-10-CM

## 2024-02-05 DIAGNOSIS — N184 Chronic kidney disease, stage 4 (severe): Secondary | ICD-10-CM | POA: Diagnosis present

## 2024-02-05 DIAGNOSIS — E1142 Type 2 diabetes mellitus with diabetic polyneuropathy: Secondary | ICD-10-CM | POA: Diagnosis not present

## 2024-02-05 DIAGNOSIS — E1122 Type 2 diabetes mellitus with diabetic chronic kidney disease: Secondary | ICD-10-CM | POA: Diagnosis not present

## 2024-02-05 DIAGNOSIS — C801 Malignant (primary) neoplasm, unspecified: Secondary | ICD-10-CM | POA: Diagnosis not present

## 2024-02-05 DIAGNOSIS — D631 Anemia in chronic kidney disease: Secondary | ICD-10-CM | POA: Diagnosis present

## 2024-02-05 DIAGNOSIS — C7951 Secondary malignant neoplasm of bone: Secondary | ICD-10-CM | POA: Diagnosis present

## 2024-02-05 MED ORDER — SODIUM CHLORIDE 0.9 % IV SOLN
950.0000 mg | Freq: Once | INTRAVENOUS | Status: AC
  Administered 2024-02-05: 950 mg via INTRAVENOUS
  Filled 2024-02-05: qty 19

## 2024-02-05 MED ORDER — FAMOTIDINE IN NACL 20-0.9 MG/50ML-% IV SOLN
20.0000 mg | Freq: Once | INTRAVENOUS | Status: AC
  Administered 2024-02-05: 20 mg via INTRAVENOUS
  Filled 2024-02-05: qty 50

## 2024-02-05 MED ORDER — SODIUM CHLORIDE 0.9 % IV SOLN
50.0000 mg | Freq: Once | INTRAVENOUS | Status: AC
  Administered 2024-02-05: 50 mg via INTRAVENOUS
  Filled 2024-02-05: qty 1

## 2024-02-05 MED ORDER — CLONIDINE HCL 0.1 MG PO TABS
0.2000 mg | ORAL_TABLET | Freq: Once | ORAL | Status: AC
  Administered 2024-02-05: 0.2 mg via ORAL
  Filled 2024-02-05: qty 2

## 2024-02-05 MED ORDER — SODIUM CHLORIDE 0.9 % IV SOLN: INTRAVENOUS | Status: DC

## 2024-02-05 MED ORDER — METHYLPREDNISOLONE SODIUM SUCC 125 MG IJ SOLR
125.0000 mg | Freq: Once | INTRAMUSCULAR | Status: AC
  Administered 2024-02-05: 125 mg via INTRAVENOUS
  Filled 2024-02-05: qty 2

## 2024-02-05 MED ORDER — CETIRIZINE HCL 10 MG/ML IV SOLN
10.0000 mg | Freq: Once | INTRAVENOUS | Status: AC
  Administered 2024-02-05: 10 mg via INTRAVENOUS
  Filled 2024-02-05: qty 1

## 2024-02-05 MED ORDER — ACETAMINOPHEN 325 MG PO TABS
650.0000 mg | ORAL_TABLET | Freq: Once | ORAL | Status: AC
  Administered 2024-02-05: 650 mg via ORAL
  Filled 2024-02-05: qty 2

## 2024-02-05 NOTE — Patient Instructions (Signed)
 [facility] [place] - A DEPT OF [facility]  Discharge Instructions: Thank you for choosing [facility] to provide your oncology and hematology care.  If you have a lab appointment with the Cancer Center - please note that after April 8th, 2024, all labs will be drawn in the cancer center.  You do not have to check in or register with the main entrance as you have in the past but will complete your check-in in the cancer center.  Wear comfortable clothing and clothing appropriate for easy access to any Portacath or PICC line.   We strive to give you quality time with your provider. You may need to reschedule your appointment if you arrive late (15 or more minutes).  Arriving late affects you and other patients whose appointments are after yours.  Also, if you miss three or more appointments without notifying the office, you may be dismissed from the clinic at the provider's discretion.      For prescription refill requests, have your pharmacy contact our office and allow 72 hours for refills to be completed.    Today you received the following chemotherapy and/or immunotherapy agents Infed      To help prevent nausea and vomiting after your treatment, we encourage you to take your nausea medication as directed.  BELOW ARE SYMPTOMS THAT SHOULD BE REPORTED IMMEDIATELY: *FEVER GREATER THAN 100.4 F (38 C) OR HIGHER *CHILLS OR SWEATING *NAUSEA AND VOMITING THAT IS NOT CONTROLLED WITH YOUR NAUSEA MEDICATION *UNUSUAL SHORTNESS OF BREATH *UNUSUAL BRUISING OR BLEEDING *URINARY PROBLEMS (pain or burning when urinating, or frequent urination) *BOWEL PROBLEMS (unusual diarrhea, constipation, pain near the anus) TENDERNESS IN MOUTH AND THROAT WITH OR WITHOUT PRESENCE OF ULCERS (sore throat, sores in mouth, or a toothache) UNUSUAL RASH, SWELLING OR PAIN  UNUSUAL VAGINAL DISCHARGE OR ITCHING   Items with * indicate a potential emergency and should be followed up as  soon as possible or go to the Emergency Department if any problems should occur.  Please show the CHEMOTHERAPY ALERT CARD or IMMUNOTHERAPY ALERT CARD at check-in to the Emergency Department and triage nurse.  Should you have questions after your visit or need to cancel or reschedule your appointment, please contact [facility] [place] - A DEPT OF Eligha Brideg[facility] [phone]  and follow the prompts.  Office hours are 8:00 a.m. to 4:30 p.m. Monday - Friday. Please note that voicemails left after 4:00 p.m. may not be returned until the following business day.  We are closed weekends and major holidays. You have access to a nurse at all times for urgent questions. Please call the main number to the clinic [phone] and follow the prompts.  For any non-urgent questions, you may also contact your provider using MyChart. We now offer e-Visits for anyone 6 and older to request care online for non-urgent symptoms. For details visit [email or web address].   Also download the MyChart app! Go to the app store, search "MyChart", open the app, select [place], and log in with your MyChart username and password.

## 2024-02-05 NOTE — Progress Notes (Signed)
 Patient presents today for Infed infusion per providers order.  Vital signs stable, BP is elevated at 180/75, MD notified.  Patient states that he usually runs high and that he did not take his morning BP medication prior to visit.  Patient received Clonidine 0.2 mg PO per providers order.  Peripheral IV started and blood return noted pre and post infusion.  Stable during infusion without adverse affects.  Vital signs stable.  No complaints at this time.  Discharge from clinic ambulatory in stable condition.  Alert and oriented X 3.  Follow up with [facility] as scheduled.

## 2024-02-18 NOTE — Progress Notes (Unsigned)
 Office Note     CC:  follow up Requesting Provider:  [facility]  HPI: [name] is a 78 y.o. (May 27, 1946) male who presents for surveillance of PAD.  He is well-known to [facility] with an extensive surgical history including right to left femoral to femoral bypass with Dr. Dyane.  In 2017 he underwent redo right femoral artery exposure with iliofemoral endarterectomy and bovine patch angioplasty and redo limb of the femorofemoral bypass with Dr. Serene.  In 2018 he had ultrasound guided access of the left brachial with angioplasty of the right common femoral artery and stenting of the right external iliac by Dr. Serene.  In February 2023 he underwent right common iliac artery angioplasty and stenting due to recurrent iliac stenosis.  He also underwent diagnostic angiography in February 2024 due to suspected inflow threatening stenosis of the femorofemoral bypass.    He denies any claudication, rest pain, or tissue loss of bilateral lower extremities.  He is taking an aspirin , statin, Plavix  daily. He is a former smoker.  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   x 5   Hypercholesteremia    Hypertension    Neuropathy    Peripheral vascular disease (HCC)    s/p R-L FEM-FEM BYPASS   Pneumonia    Tachycardia    after CABG, pt on medicine for this    Past Surgical History:  Procedure Laterality Date   ABDOMINAL AORTOGRAM Bilateral 09/19/2016   Procedure: iliac;  Surgeon: Gaile LELON Serene, MD;  [facility]: [facility];  Service: Cardiovascular;  Laterality: Bilateral;   ABDOMINAL AORTOGRAM  W/LOWER EXTREMITY N/A 09/22/2021   Procedure: ABDOMINAL AORTOGRAM W/LOWER EXTREMITY;  Surgeon: Gretta Lonni PARAS, MD;  [facility]: [facility];  Service: Cardiovascular;  Laterality: N/A;   ABDOMINAL AORTOGRAM W/LOWER EXTREMITY N/A 10/05/2022   Procedure: ABDOMINAL AORTOGRAM W/LOWER EXTREMITY;  Surgeon: Gretta Lonni PARAS, MD;  [facility];  Service: Cardiovascular;  Laterality: N/A;   BIOPSY  05/16/2023   Procedure: BIOPSY;  Surgeon: Shaaron Lamar HERO, MD;  [facility];  Service: Endoscopy;;   CARDIAC CATHETERIZATION  2003   with stent   CARDIAC CATHETERIZATION N/A 10/11/2015   Procedure: Left Heart Cath and Coronary Angiography;  Surgeon: Peter M Swaziland, MD;  Location: [facility] INVASIVE CV LAB;  Service: Cardiovascular;  Laterality: N/A;   COLON RESECTION N/A 12/13/2017   Procedure: EXPLORATORY LAPAROTOMY, SIGMOID COLECTOMY WITH COLOSTOMY;  Surgeon: Belinda Cough, MD;  [facility]: [facility];  Service: General;  Laterality: N/A;   COLONOSCOPY N/A 09/22/2013   Procedure: COLONOSCOPY;  Surgeon: Lamar HERO Shaaron, MD;  [facility];  Service: Endoscopy;  Laterality: N/A;  9:30 AM   COLONOSCOPY N/A 02/19/2018   Procedure: COLONOSCOPY;  Surgeon: Shaaron Lamar HERO, MD;  [facility];  Service: Endoscopy;  Laterality: N/A;  2:00pm   COLONOSCOPY WITH PROPOFOL  N/A 05/16/2023   Procedure: COLONOSCOPY WITH PROPOFOL ;  Surgeon: Shaaron Lamar HERO, MD;  [facility];  Service: Endoscopy;  Laterality: N/A;  730am, asa 3  COLOSTOMY N/A 12/13/2017   Procedure: COLOSTOMY;  Surgeon: Belinda Cough, MD;  Location: [facility] OR;  Service: General;  Laterality: N/A;   COLOSTOMY REVERSAL N/A 03/26/2018   Procedure: COLOSTOMY REVERSAL;  Surgeon: Belinda Cough, MD;  [facility];  Service: General;  Laterality: N/A;   CORONARY ARTERY BYPASS GRAFT N/A 10/18/2015   Procedure: CORONARY ARTERY BYPASS GRAFTING (CABG) x  five, using left internal mammary artery and right leg  greater saphenous vein harvested endoscopically;  Surgeon: Elspeth JAYSON Millers, MD;  [facility]: [facility];  Service: Open Heart Surgery;  Laterality: N/A;   ENDARTERECTOMY FEMORAL Right 08/09/2016   Procedure: ENDARTERECTOMY FEMORAL WITH VEIN PATCH ANGIOPLASTY;  Surgeon: Gaile LELON New, MD;  [facility];  Service: Vascular;  Laterality: Right;   ESOPHAGOGASTRODUODENOSCOPY N/A 10/24/2017   Dr. Shaaron: hiatal hernia   ESOPHAGOGASTRODUODENOSCOPY N/A 01/01/2024   Procedure: EGD (ESOPHAGOGASTRODUODENOSCOPY);  Surgeon: Cinderella Deatrice FALCON, MD;  [facility];  Service: Endoscopy;  Laterality: N/A;   ESOPHAGOGASTRODUODENOSCOPY (EGD) WITH PROPOFOL  N/A 05/16/2023   Procedure: ESOPHAGOGASTRODUODENOSCOPY (EGD) WITH PROPOFOL ;  Surgeon: Shaaron Lamar HERO, MD;  [facility];  Service: Endoscopy;  Laterality: N/A;   FEMORAL-FEMORAL BYPASS GRAFT Bilateral 08/09/2016   Procedure: REVISION BYPASS GRAFT RIGHT FEMORAL-LEFT FEMORAL ARTERY;  Surgeon: Gaile LELON New, MD;  Location: [facility] OR;  Service: Vascular;  Laterality: Bilateral;   FEMORAL-POPLITEAL BYPASS GRAFT     INTRAMEDULLARY (IM) NAIL INTERTROCHANTERIC Left 04/03/2021   Procedure: LEFT  HIP INTRAMEDULLARY (IM) NAIL INTERTROCHANTRIC;  Surgeon: Vernetta Lonni GRADE, MD;  [facility];  Service: Orthopedics;  Laterality: Left;   IR FLUORO GUIDED NEEDLE PLC ASPIRATION/INJECTION LOC  12/26/2023   IR IVC FILTER RETRIEVAL / S&I PORTER GUID/MOD SED  01/15/2024   MALONEY DILATION N/A 05/16/2023   Procedure: AGAPITO DILATION;  Surgeon: Shaaron Lamar HERO, MD;  [facility];  Service: Endoscopy;  Laterality: N/A;   PERIPHERAL VASCULAR BALLOON ANGIOPLASTY Right 09/22/2021   Procedure: PERIPHERAL VASCULAR BALLOON ANGIOPLASTY;  Surgeon: Gretta Lonni PARAS, MD;  [facility];  Service: Cardiovascular;  Laterality: Right;   PERIPHERAL VASCULAR CATHETERIZATION N/A 05/08/2016   Procedure: Lower Extremity Angiography;  Surgeon:  Dorn PARAS Lesches, MD;  Location: [facility] INVASIVE CV LAB;  Service: Cardiovascular;  Laterality: N/A;   PERIPHERAL VASCULAR INTERVENTION Right 09/19/2016   Procedure: Peripheral Vascular Intervention;  Surgeon: Gaile LELON New, MD;  [facility];  Service: Cardiovascular;  Laterality: Right;  ext iliac stent   PERIPHERAL VASCULAR INTERVENTION Right 09/22/2021   Procedure: PERIPHERAL VASCULAR INTERVENTION;  Surgeon: Gretta Lonni PARAS, MD;  [facility];  Service: Cardiovascular;  Laterality: Right;   POLYPECTOMY  02/19/2018   Procedure: POLYPECTOMY;  Surgeon: Shaaron Lamar HERO, MD;  [facility];  Service: Endoscopy;;   PROCTOSCOPY  10/24/2017   Procedure: PROCTOSCOPY;  Surgeon: Shaaron Lamar HERO, MD;  [facility];  Service: Endoscopy;;   TEE WITHOUT CARDIOVERSION N/A 10/18/2015   Procedure: TRANSESOPHAGEAL ECHOCARDIOGRAM (TEE);  Surgeon: Elspeth JAYSON Millers, MD;  Location: [facility] OR;  Service: Open Heart Surgery;  Laterality: N/A;    Social History   Socioeconomic History   Marital status: Married    Spouse name: Not on file   Number of children: Not on file   Years of education: Not on file   Highest education level: Not on file  Occupational History   Not on file  Tobacco Use   Smoking status: Former    Current  packs/day: 0.00    Average packs/day: 2.0 packs/day for 30.0 years (60.0 ttl pk-yrs)    Types: Cigarettes    Start date: 08/14/1970    Quit date: 08/14/2000    Years since quitting: 23.5   Smokeless tobacco: Never  Vaping Use   Vaping status: Never Used  Substance and Sexual Activity   Alcohol  use: No   Drug use: No   Sexual activity: Not Currently  Other Topics Concern   Not on file  Social History Narrative   Not on file   Social Drivers of Health   Financial Resource Strain: Low Risk  (10/08/2023)   Received from [facility]   Overall Financial Resource Strain (CARDIA)    Difficulty of Paying Living Expenses: Not  very hard  Food Insecurity: No Food Insecurity (01/28/2024)   Hunger Vital Sign    Worried About Running Out of Food in the Last Year: Never true    Ran Out of Food in the Last Year: Never true  Recent Concern: Food Insecurity - Food Insecurity Present (12/18/2023)   Hunger Vital Sign    Worried About Running Out of Food in the Last Year: Sometimes true    Ran Out of Food in the Last Year: Sometimes true  Transportation Needs: No Transportation Needs (01/28/2024)   PRAPARE - Administrator, Civil Service (Medical): No    Lack of Transportation (Non-Medical): No  Physical Activity: Not on file  Stress: Not on file  Social Connections: Socially Integrated (01/28/2024)   Social Connection and Isolation Panel    Frequency of Communication with Friends and Family: More than three times a week    Frequency of Social Gatherings with Friends and Family: Three times a week    Attends Religious Services: More than 4 times per year    Active Member of Clubs or Organizations: Yes    Attends Banker Meetings: 1 to 4 times per year    Marital Status: Married  Catering manager Violence: Not At Risk (01/28/2024)   Humiliation, Afraid, Rape, and Kick questionnaire    Fear of Current or Ex-Partner: No    Emotionally Abused: No    Physically Abused: No    Sexually Abused: No    Family History  Problem Relation Age of Onset   Heart attack Mother    Stroke Mother    Colon cancer Brother        late 26s    Current Outpatient Medications  Medication Sig Dispense Refill   albuterol  (VENTOLIN  HFA) 108 (90 Base) MCG/ACT inhaler Inhale 2 puffs into the lungs every 6 (six) hours as needed for wheezing or shortness of breath. 8 g 2   amLODipine  (NORVASC ) 10 MG tablet Take 10 mg by mouth in the morning.     aspirin  EC 81 MG tablet Take 1 tablet (81 mg total) by mouth daily with breakfast. 30 tablet 2   clopidogrel  (PLAVIX ) 75 MG tablet Take 75 mg by mouth in the morning.      Fluticasone-Umeclidin-Vilant (TRELEGY ELLIPTA) 200-62.5-25 MCG/ACT AEPB Inhale 2 puffs into the lungs daily.     gabapentin  (NEURONTIN ) 300 MG capsule Take 1 capsule (300 mg total) by mouth 2 (two) times daily. 60 capsule 0   insulin  glargine (LANTUS ) 100 UNIT/ML injection Inject 0.1 mLs (10 Units total) into the skin at bedtime. (Patient taking differently: Inject 20 Units into the skin at bedtime.)     losartan  (COZAAR ) 50 MG tablet Take 50 mg by  mouth daily.     metoprolol  tartrate (LOPRESSOR ) 50 MG tablet Take 25 mg by mouth 2 (two) times daily.      naproxen (NAPROSYN) 500 MG tablet Take 500 mg by mouth 2 (two) times daily.     pantoprazole  (PROTONIX ) 40 MG tablet Take 1 tablet (40 mg total) by mouth 2 (two) times daily. 60 tablet 0   vitamin B-12 (CYANOCOBALAMIN ) 1000 MCG tablet Take 1,000 mcg by mouth in the morning.     No current facility-administered medications for this visit.    Allergies  Allergen Reactions   Baclofen Other (See Comments)    Altered mental status, extreme drowsiness   Acarbose Diarrhea     REVIEW OF SYSTEMS:   [X]  denotes positive finding, [ ]  denotes negative finding Cardiac  Comments:  Chest pain or chest pressure:    Shortness of breath upon exertion:    Short of breath when lying flat:    Irregular heart rhythm:        Vascular    Pain in calf, thigh, or hip brought on by ambulation:    Pain in feet at night that wakes you up from your sleep:     Blood clot in your veins:    Leg swelling:         Pulmonary    Oxygen at home:    Productive cough:     Wheezing:         Neurologic    Sudden weakness in arms or legs:     Sudden numbness in arms or legs:     Sudden onset of difficulty speaking or slurred speech:    Temporary loss of vision in one eye:     Problems with dizziness:         Gastrointestinal    Blood in stool:     Vomited blood:         Genitourinary    Burning when urinating:     Blood in urine:        Psychiatric     Major depression:         Hematologic    Bleeding problems:    Problems with blood clotting too easily:        Skin    Rashes or ulcers:        Constitutional    Fever or chills:      PHYSICAL EXAMINATION:  Vitals:   02/19/24 1337  BP: (!) 143/77  Pulse: 99  SpO2: 96%  Weight: 138 lb (62.6 kg)  Height: 5' 7 (1.702 m)    General:  WDWN in NAD; vital signs documented above Gait: Not observed HENT: WNL, normocephalic Pulmonary: normal non-labored breathing  Cardiac: regular HR Abdomen: soft, NT, no masses Skin: without rashes Vascular Exam/Pulses: palpable femoral pulses Extremities: without ischemic changes, without Gangrene , without cellulitis; without open wounds;  Musculoskeletal: no muscle wasting or atrophy  Neurologic: A&O X 3 Psychiatric:  The pt has Normal affect.   Non-Invasive Vascular Imaging:   Velocity of 508 cm/s and proximal right external iliac artery Velocities in the mid 300s in the mid and distal external iliac artery  316 cm/s at the outflow of the right to left femorofemoral bypass  ABI/TBIToday's ABIToday's TBIPrevious ABIPrevious TBI  +-------+-----------+-----------+------------+------------+  Right 0.7        0.28       0.71        0.39          +-------+-----------+-----------+------------+------------+  Left  [place]         0.18       [place]          0.3           +-------+-----------+-----------+------------+------------+     ASSESSMENT/PLAN:: 78 y.o. male here for follow up for surveillance of PAD with history of numerous right iliac endovascular intervention and history of right to left femoral-femoral bypass  Mr. Vandehei is a 78 year old male well-known to [facility] with numerous bilateral lower extremity interventions over the years including right iliac endovascular interventions as well as right to left femorofemoral bypass.  Most recent aortogram in February 2024 was negative for any flow-limiting inflow stenosis to the  femorofemoral bypass.  Duplex however shows an increase in velocities of the right common and external iliac artery suggesting a threatened femorofemoral bypass.  Most recent BMP demonstrates a creatinine of 1.5.  Plan will be to proceed with aortogram with possible intervention of the femorofemoral bypass and right iliac system with Dr. Gretta in the near future.  This may involve CO2 contrast to limit risk of kidney injury.  He and his wife have vacations planned for this month and next and may want to wait until the end of August to proceed with aortogram.  He will continue his Plavix  and aspirin  perioperatively.  Angiogram was discussed in detail with the patient and his wife and they are agreeable to proceed.   Donnice Sender, PA-C [facility] [phone]

## 2024-02-19 ENCOUNTER — Telehealth: Payer: Self-pay

## 2024-02-19 ENCOUNTER — Ambulatory Visit (INDEPENDENT_AMBULATORY_CARE_PROVIDER_SITE_OTHER): Admitting: Physician Assistant

## 2024-02-19 ENCOUNTER — Other Ambulatory Visit: Payer: Self-pay

## 2024-02-19 VITALS — BP 143/77 | HR 99 | Ht 67.0 in | Wt 138.0 lb

## 2024-02-19 DIAGNOSIS — I739 Peripheral vascular disease, unspecified: Secondary | ICD-10-CM | POA: Diagnosis not present

## 2024-02-19 DIAGNOSIS — N289 Disorder of kidney and ureter, unspecified: Secondary | ICD-10-CM | POA: Diagnosis not present

## 2024-02-19 DIAGNOSIS — I771 Stricture of artery: Secondary | ICD-10-CM | POA: Diagnosis not present

## 2024-02-19 DIAGNOSIS — T82392S Other mechanical complication of femoral arterial graft (bypass), sequela: Secondary | ICD-10-CM

## 2024-02-19 NOTE — Telephone Encounter (Signed)
 Per PA, M. Eveland, schedule for Abd Aortogram with Dr. Gretta on Thursday, 7/10.  Per patient, he will be out of town.  Wanted to schedule for 2-3 weeks in future.  Accepted 7/31 for procedure.

## 2024-02-19 NOTE — Telephone Encounter (Signed)
 Called to provide patient with procedural information prior to aortogram with Dr. Gretta on 7/10.  Patient needs to arrive at [facility] at 7:00am, NPO after MN and adjust insulin .  Letter sent to patient - in MyChart.

## 2024-02-26 ENCOUNTER — Telehealth: Payer: Self-pay

## 2024-02-26 NOTE — Telephone Encounter (Signed)
 Kima K. Requested a [place] auth for this pt for surgery date 03/13/24.  [facility]waiting [facility] response.

## 2024-02-29 ENCOUNTER — Encounter (INDEPENDENT_AMBULATORY_CARE_PROVIDER_SITE_OTHER): Payer: Self-pay | Admitting: *Deleted

## 2024-03-03 ENCOUNTER — Ambulatory Visit
Admission: EM | Admit: 2024-03-03 | Discharge: 2024-03-03 | Disposition: A | Discharge status: Home / Self Care | Attending: Family Medicine | Admitting: Family Medicine

## 2024-03-03 ENCOUNTER — Ambulatory Visit (INDEPENDENT_AMBULATORY_CARE_PROVIDER_SITE_OTHER)

## 2024-03-03 DIAGNOSIS — R0781 Pleurodynia: Secondary | ICD-10-CM | POA: Diagnosis not present

## 2024-03-03 DIAGNOSIS — S2241XA Multiple fractures of ribs, right side, initial encounter for closed fracture: Secondary | ICD-10-CM | POA: Diagnosis not present

## 2024-03-03 DIAGNOSIS — R918 Other nonspecific abnormal finding of lung field: Secondary | ICD-10-CM | POA: Diagnosis not present

## 2024-03-03 DIAGNOSIS — Z8701 Personal history of pneumonia (recurrent): Secondary | ICD-10-CM

## 2024-03-03 MED ORDER — LIDOCAINE 5 % EX PTCH: 1.0000 | MEDICATED_PATCH | CUTANEOUS | 0 refills | Status: DC

## 2024-03-03 MED ORDER — DOXYCYCLINE HYCLATE 100 MG PO CAPS: 100.0000 mg | ORAL_CAPSULE | Freq: Two times a day (BID) | ORAL | 0 refills | Status: DC

## 2024-03-03 NOTE — Discharge Instructions (Signed)
 We will cover for a possible redeveloping pneumonia to the right lung with doxycycline  and I recommend you use the incentive spirometer several times daily consistently to keep your lungs nicely inflated, focusing on deep breaths throughout the day as well.  The x-ray did show two healing rib fractures.  I have prescribed some numbing patches to place to the area of pain and you may use heat, over-the-counter pain relievers additionally.  Follow-up as soon as possible with your primary care provider for further evaluation, go to the emergency department for worsening symptoms at any time.

## 2024-03-03 NOTE — ED Triage Notes (Signed)
 Pt reports lower back pain, right side pain near the ribs due to fall, fell on his deck the day he came home from the hospital

## 2024-03-04 ENCOUNTER — Inpatient Hospital Stay: Attending: Hematology

## 2024-03-04 DIAGNOSIS — D509 Iron deficiency anemia, unspecified: Secondary | ICD-10-CM | POA: Diagnosis not present

## 2024-03-04 DIAGNOSIS — D631 Anemia in chronic kidney disease: Secondary | ICD-10-CM | POA: Diagnosis not present

## 2024-03-04 DIAGNOSIS — D508 Other iron deficiency anemias: Secondary | ICD-10-CM

## 2024-03-04 DIAGNOSIS — Z87891 Personal history of nicotine dependence: Secondary | ICD-10-CM | POA: Diagnosis not present

## 2024-03-04 DIAGNOSIS — C7951 Secondary malignant neoplasm of bone: Secondary | ICD-10-CM | POA: Insufficient documentation

## 2024-03-04 DIAGNOSIS — E1122 Type 2 diabetes mellitus with diabetic chronic kidney disease: Secondary | ICD-10-CM | POA: Diagnosis not present

## 2024-03-04 DIAGNOSIS — Z800 Family history of malignant neoplasm of digestive organs: Secondary | ICD-10-CM | POA: Diagnosis not present

## 2024-03-04 DIAGNOSIS — N184 Chronic kidney disease, stage 4 (severe): Secondary | ICD-10-CM | POA: Insufficient documentation

## 2024-03-04 DIAGNOSIS — C801 Malignant (primary) neoplasm, unspecified: Secondary | ICD-10-CM | POA: Insufficient documentation

## 2024-03-04 DIAGNOSIS — Z77098 Contact with and (suspected) exposure to other hazardous, chiefly nonmedicinal, chemicals: Secondary | ICD-10-CM | POA: Diagnosis not present

## 2024-03-04 LAB — IRON AND TIBC
Iron: 20 ug/dL — ABNORMAL LOW (ref 45–182)
Saturation Ratios: 10 % — ABNORMAL LOW (ref 17.9–39.5)
TIBC: 197 ug/dL — ABNORMAL LOW (ref 250–450)
UIBC: 177 ug/dL

## 2024-03-04 LAB — CBC
HCT: 32.7 % — ABNORMAL LOW (ref 39.0–52.0)
Hemoglobin: 10.4 g/dL — ABNORMAL LOW (ref 13.0–17.0)
MCH: 25.9 pg — ABNORMAL LOW (ref 26.0–34.0)
MCHC: 31.8 g/dL (ref 30.0–36.0)
MCV: 81.5 fL (ref 80.0–100.0)
Platelets: 572 K/uL — ABNORMAL HIGH (ref 150–400)
RBC: 4.01 MIL/uL — ABNORMAL LOW (ref 4.22–5.81)
RDW: 21.2 % — ABNORMAL HIGH (ref 11.5–15.5)
WBC: 18.5 K/uL — ABNORMAL HIGH (ref 4.0–10.5)
nRBC: 0 % (ref 0.0–0.2)

## 2024-03-04 LAB — FERRITIN: Ferritin: 336 ng/mL (ref 24–336)

## 2024-03-04 NOTE — ED Provider Notes (Signed)
 [facility]    CSN: [id number] Arrival date & time: 03/03/24  1212      History   Chief Complaint No chief complaint on file.   HPI [name] is a 78 y.o. male.   Patient presenting today with several week history of right posterior rib pain, swelling, shortness of breath and mild cough at times following a fall hitting this area after coming on from the hospital.  Was admitted for pneumonia, felt like he had been doing better for a period of time with this but now starting to feel like it is coming back.  Denies known fever, chills, body aches, chest pain, vomiting, diarrhea but spouse notes significant fatigue, low energy the past few days.  So far not trying anything over-the-counter for symptoms.    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   x 5   Hypercholesteremia    Hypertension    Neuropathy    Peripheral vascular disease (HCC)    s/p R-L FEM-FEM BYPASS   Pneumonia    Tachycardia    after CABG, pt on medicine for this    Patient Active Problem List   Diagnosis Date Noted   Acute encephalopathy 01/26/2024   Iron  deficiency anemia 01/23/2024   Chronic kidney disease (CKD), stage IV (severe) (HCC) 01/13/2024   Coffee ground emesis 01/01/2024   Acute esophagitis 01/01/2024   Acute respiratory failure with hypoxia (HCC) 01/01/2024   Acute renal failure superimposed on stage 4 chronic kidney disease (HCC) 12/29/2023   Metastatic cancer to bone (HCC) 12/18/2023   Syncope and collapse 12/10/2023   Hypocalcemia 12/10/2023   Anemia 12/10/2023   Lytic bone lesions on xray 12/08/2023   Multifocal pneumonia 12/07/2023    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   H/O adenomatous polyp of colon 11/22/2022   Aortic stenosis 05/30/2022   Coronary artery disease involving coronary bypass graft of native heart without angina pectoris 11/15/2021   Pain and swelling of right elbow 04/23/2021   Fall    Closed nondisplaced intertrochanteric fracture of left femur (HCC)    Closed left hip fracture, initial encounter (HCC) 04/01/2021   CAP (community acquired pneumonia) 05/21/2019   Diarrhea 03/18/2019   Loss of weight 03/18/2019   Klebsiella sepsis (HCC) 09/15/2018   Urinary tract infection without hematuria    Acute lower UTI 09/13/2018   ABLA (acute blood loss anemia) 03/27/2018   S/P colostomy takedown 03/26/2018   Hypertension 12/12/2017   Type 2 diabetes mellitus with diabetic neuropathy (HCC) 12/12/2017   CKD (chronic kidney disease), stage III (HCC) 12/12/2017   Hypercholesteremia 12/12/2017   Hypokalemia 12/12/2017   GI bleed 12/06/2017   Rectal bleed 12/06/2017   Uncontrolled type 2 diabetes mellitus with hyperglycemia, with long-term current use of insulin  (HCC) 12/06/2017   Acute renal failure superimposed on stage 3 chronic kidney disease (HCC) 12/06/2017   Malnutrition of moderate degree 12/06/2017   Abnormal CT scan, colon 11/23/2017   Constipation 11/23/2017   Pneumonia of left upper lobe due to infectious organism 10/02/2017   Diverticulitis large intestine 08/19/2017   Diverticulitis of large intestine with perforation and abscess without  bleeding    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 08/16/2017   CKD (chronic kidney disease) stage 3, GFR 30-59 ml/min (HCC) 08/02/2017   Sepsis due to undetermined organism (HCC) 08/01/2017   GERD (gastroesophageal reflux disease) 08/01/2017   PAD (peripheral artery disease) (HCC) 08/09/2016   Claudication (HCC) 05/08/2016   HCAP (healthcare-associated pneumonia) 01/12/2016   Hyponatremia 01/12/2016   Abnormal chest CT 10/13/2015    Hx of CABG 10/13/2015   Type 2 diabetes, HbA1c goal < 7% (HCC)    Unstable angina (HCC)    Hypomagnesemia    Type 2 diabetes mellitus (HCC) 10/09/2015   ASCVD (arteriosclerotic cardiovascular disease) 10/09/2015   CKD (chronic kidney disease) stage 2, GFR 60-89 ml/min 10/09/2015   Sepsis (HCC) 07/09/2015   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 07/09/2015   PVD (peripheral vascular disease) (HCC) 07/09/2015   Essential hypertension 07/09/2015   Orthostatic hypotension 07/09/2015   Neuropathy, peripheral 07/09/2015   HLD (hyperlipidemia) 07/09/2015   AKI (acute kidney injury) (HCC) 07/09/2015   CAD S/P percutaneous coronary angioplasty 08/14/1996    Past Surgical History:  Procedure Laterality Date   ABDOMINAL AORTOGRAM Bilateral 09/19/2016   Procedure: iliac;  Surgeon: Gaile LELON New, MD;  Location: [facility];  Service: Cardiovascular;  Laterality: Bilateral;   ABDOMINAL AORTOGRAM W/LOWER EXTREMITY N/A 09/22/2021   Procedure: ABDOMINAL AORTOGRAM W/LOWER EXTREMITY;  Surgeon: Gretta Lonni PARAS, MD;  Location: [facility];  Service: Cardiovascular;  Laterality: N/A;   ABDOMINAL AORTOGRAM W/LOWER EXTREMITY N/A 10/05/2022   Procedure: ABDOMINAL AORTOGRAM W/LOWER EXTREMITY;  Surgeon: Gretta Lonni PARAS, MD;  Location: [facility];  Service: Cardiovascular;  Laterality: N/A;   BIOPSY  05/16/2023   Procedure: BIOPSY;  Surgeon: Shaaron Lamar HERO, MD;  Location: [facility];  Service: Endoscopy;;   CARDIAC CATHETERIZATION  2003   with stent   CARDIAC CATHETERIZATION N/A 10/11/2015   Procedure: Left Heart Cath and Coronary Angiography;  Surgeon: Peter M Swaziland, MD;  Location: [facility];  Service: Cardiovascular;  Laterality: N/A;   COLON RESECTION N/A 12/13/2017   Procedure: EXPLORATORY LAPAROTOMY, SIGMOID COLECTOMY WITH COLOSTOMY;  Surgeon: Belinda Cough, MD;  Location: [facility];  Service: General;  Laterality: N/A;   COLONOSCOPY N/A 09/22/2013   Procedure:  COLONOSCOPY;  Surgeon: Lamar HERO Shaaron, MD;  Location: [facility];  Service: Endoscopy;  Laterality: N/A;  9:30 AM   COLONOSCOPY N/A 02/19/2018   Procedure: COLONOSCOPY;  Surgeon: Shaaron Lamar HERO, MD;  [facility]: [facility];  Service: Endoscopy;  Laterality: N/A;  2:00pm   COLONOSCOPY WITH PROPOFOL  N/A 05/16/2023   Procedure: COLONOSCOPY WITH PROPOFOL ;  Surgeon: Shaaron Lamar HERO, MD;  [facility]: [facility];  Service: Endoscopy;  Laterality: N/A;  730am, asa 3   COLOSTOMY N/A 12/13/2017   Procedure: COLOSTOMY;  Surgeon: Belinda Cough, MD;  Location: [facility] OR;  Service: General;  Laterality: N/A;   COLOSTOMY REVERSAL N/A 03/26/2018   Procedure: COLOSTOMY REVERSAL;  Surgeon: Belinda Cough, MD;  Location: [facility];  Service: General;  Laterality: N/A;   CORONARY ARTERY BYPASS GRAFT N/A 10/18/2015   Procedure: CORONARY ARTERY BYPASS GRAFTING (CABG) x  five, using left internal mammary artery and right leg greater saphenous vein harvested endoscopically;  Surgeon: Elspeth JAYSON Millers, MD;  Location: [facility];  Service: Open Heart Surgery;  Laterality: N/A;   ENDARTERECTOMY FEMORAL Right 08/09/2016   Procedure: ENDARTERECTOMY FEMORAL WITH VEIN PATCH ANGIOPLASTY;  Surgeon: Gaile LELON New, MD;  Location: [facility];  Service: Vascular;  Laterality: Right;   ESOPHAGOGASTRODUODENOSCOPY N/A 10/24/2017   Dr. Shaaron: hiatal hernia   ESOPHAGOGASTRODUODENOSCOPY N/A 01/01/2024   Procedure: EGD (ESOPHAGOGASTRODUODENOSCOPY);  Surgeon: Cinderella Deatrice FALCON, MD;  [facility];  Service: Endoscopy;  Laterality: N/A;   ESOPHAGOGASTRODUODENOSCOPY (EGD) WITH PROPOFOL  N/A 05/16/2023   Procedure: ESOPHAGOGASTRODUODENOSCOPY (EGD) WITH PROPOFOL ;  Surgeon: Shaaron Lamar HERO, MD;  [facility];  Service: Endoscopy;  Laterality: N/A;   FEMORAL-FEMORAL BYPASS GRAFT Bilateral 08/09/2016   Procedure: REVISION BYPASS GRAFT RIGHT FEMORAL-LEFT FEMORAL ARTERY;  Surgeon: Gaile LELON New, MD;  Location: [facility] OR;  Service:  Vascular;  Laterality: Bilateral;   FEMORAL-POPLITEAL BYPASS GRAFT     INTRAMEDULLARY (IM) NAIL INTERTROCHANTERIC Left 04/03/2021   Procedure: LEFT  HIP INTRAMEDULLARY (IM) NAIL INTERTROCHANTRIC;  Surgeon: Vernetta Lonni GRADE, MD;  Location: [facility];  Service: Orthopedics;  Laterality: Left;   IR FLUORO GUIDED NEEDLE PLC ASPIRATION/INJECTION LOC  12/26/2023   IR IVC FILTER RETRIEVAL / S&I PORTER GUID/MOD SED  01/15/2024   MALONEY DILATION N/A 05/16/2023   Procedure: AGAPITO DILATION;  Surgeon: Shaaron Lamar HERO, MD;  [facility];  Service: Endoscopy;  Laterality: N/A;   PERIPHERAL VASCULAR BALLOON ANGIOPLASTY Right 09/22/2021   Procedure: PERIPHERAL VASCULAR BALLOON ANGIOPLASTY;  Surgeon: Gretta Lonni PARAS, MD;  [facility];  Service: Cardiovascular;  Laterality: Right;   PERIPHERAL VASCULAR CATHETERIZATION N/A 05/08/2016   Procedure: Lower Extremity Angiography;  Surgeon: Dorn PARAS Lesches, MD;  Location: [facility] INVASIVE CV LAB;  Service: Cardiovascular;  Laterality: N/A;   PERIPHERAL VASCULAR INTERVENTION Right 09/19/2016   Procedure: Peripheral Vascular Intervention;  Surgeon: Gaile LELON New, MD;  Location: [facility];  Service: Cardiovascular;  Laterality: Right;  ext iliac stent   PERIPHERAL VASCULAR INTERVENTION Right 09/22/2021   Procedure: PERIPHERAL VASCULAR INTERVENTION;  Surgeon: Gretta Lonni PARAS, MD;  Location: [facility];  Service: Cardiovascular;  Laterality: Right;   POLYPECTOMY  02/19/2018   Procedure: POLYPECTOMY;  Surgeon: Shaaron Lamar HERO, MD;  [facility];  Service: Endoscopy;;   PROCTOSCOPY  10/24/2017   Procedure: PROCTOSCOPY;  Surgeon: Shaaron Lamar HERO, MD;  [facility];  Service: Endoscopy;;   TEE WITHOUT CARDIOVERSION N/A 10/18/2015   Procedure: TRANSESOPHAGEAL ECHOCARDIOGRAM (TEE);  Surgeon: Elspeth JAYSON Millers, MD;  Location: [facility] OR;  Service: Open Heart Surgery;  Laterality: N/A;       Home  Medications    Prior to Admission medications   Medication Sig Start Date End Date Taking? Authorizing Provider  doxycycline  (VIBRAMYCIN ) 100 MG capsule Take 1 capsule (100 mg total) by mouth 2 (two) times daily. 03/03/24  Yes Stuart Vernell Norris, PA-C  lidocaine  (LIDODERM ) 5 % Place 1 patch onto the skin daily. Remove & Discard patch within 12 hours or as directed by MD 03/03/24  Yes Stuart Vernell Norris, PA-C  albuterol  (VENTOLIN  HFA) 108 (90 Base) MCG/ACT inhaler Inhale 2 puffs into the lungs every 6 (six) hours as needed for wheezing or shortness of breath. 01/03/24   Bryn Bernardino NOVAK, MD  amLODipine  (NORVASC ) 10 MG tablet Take 10 mg by mouth in the morning.    [provider]  aspirin  EC 81 MG tablet Take 1 tablet (81 mg total) by mouth daily with breakfast. 05/23/19   Emokpae, Courage, MD  clopidogrel  (PLAVIX ) 75 MG tablet Take 75 mg by mouth in the morning.    [provider]  Fluticasone-Umeclidin-Vilant (TRELEGY ELLIPTA) 200-62.5-25 MCG/ACT AEPB Inhale 2 puffs into  the lungs daily.    [provider]  gabapentin  (NEURONTIN ) 300 MG capsule Take 1 capsule (300 mg total) by mouth 2 (two) times daily. 01/03/24   Bryn Bernardino NOVAK, MD  insulin  glargine (LANTUS ) 100 UNIT/ML injection Inject 0.1 mLs (10 Units total) into the skin at bedtime. Patient taking differently: Inject 20 Units into the skin at bedtime. 12/11/23   Johnson, Clanford L, MD  losartan  (COZAAR ) 50 MG tablet Take 50 mg by mouth daily. 03/02/22   [provider]  metoprolol  tartrate (LOPRESSOR ) 50 MG tablet Take 25 mg by mouth 2 (two) times daily.     [provider]  naproxen (NAPROSYN) 500 MG tablet Take 500 mg by mouth 2 (two) times daily. 01/25/24   [provider]  pantoprazole  (PROTONIX ) 40 MG tablet Take 1 tablet (40 mg total) by mouth 2 (two) times daily. 01/03/24   Bryn Bernardino NOVAK, MD  vitamin B-12 (CYANOCOBALAMIN ) 1000 MCG tablet Take 1,000 mcg by mouth in the morning.     [provider]    Family History Family History  Problem Relation Age of Onset   Heart attack Mother    Stroke Mother    Colon cancer Brother        late 69s    Social History Social History   Tobacco Use   Smoking status: Former    Current packs/day: 0.00    Average packs/day: 2.0 packs/day for 30.0 years (60.0 ttl pk-yrs)    Types: Cigarettes    Start date: 08/14/1970    Quit date: 08/14/2000    Years since quitting: 23.5   Smokeless tobacco: Never  Vaping Use   Vaping status: Never Used  Substance Use Topics   Alcohol  use: No   Drug use: No     Allergies   Baclofen and Acarbose   Review of Systems Review of Systems Per HPI  Physical Exam Triage Vital Signs ED Triage Vitals  Encounter Vitals Group     BP 03/03/24 1221 (!) 142/72     Girls Systolic BP Percentile --      Girls Diastolic BP Percentile --      Boys Systolic BP Percentile --      Boys Diastolic BP Percentile --      Pulse Rate 03/03/24 1221 (!) 101     Resp 03/03/24 1221 20     Temp 03/03/24 1221 98.1 F (36.7 C)     Temp Source 03/03/24 1221 Oral     SpO2 03/03/24 1221 90 %     Weight --      Height --      Head Circumference --      Peak Flow --      Pain Score 03/03/24 1226 10     Pain Loc --      Pain Education --      Exclude from Growth Chart --    No data found.  Updated Vital Signs BP (!) 142/72 (BP Location: Right Arm)   Pulse 100   Temp 98.1 F (36.7 C) (Oral)   Resp 20   SpO2 93%   Visual Acuity Right Eye Distance:   Left Eye Distance:   Bilateral Distance:    Right Eye Near:   Left Eye Near:    Bilateral Near:     Physical Exam Vitals and nursing note reviewed.  Constitutional:      Appearance: Normal appearance.  HENT:     Head: Atraumatic.     Mouth/Throat:  Mouth: Mucous membranes are moist.  Eyes:     Extraocular Movements: Extraocular movements intact.     Conjunctiva/sclera: Conjunctivae normal.  Cardiovascular:     Rate and Rhythm:  Normal rate.  Pulmonary:     Effort: Pulmonary effort is normal. No respiratory distress.     Breath sounds: Normal breath sounds.     Comments: Guarded slightly shallow breaths due to pain Musculoskeletal:        General: Swelling, tenderness and signs of injury present. Normal range of motion.     Cervical back: Normal range of motion and neck supple.     Comments: Localized edema to right posterior rib region in the area of his pain, significantly tender to palpation in this area.  Chest rise symmetric bilaterally  Skin:    General: Skin is warm and dry.  Neurological:     General: No focal deficit present.     Mental Status: He is oriented to person, place, and time.  Psychiatric:        Mood and Affect: Mood normal.        Thought Content: Thought content normal.        Judgment: Judgment normal.    UC Treatments / Results  Labs (all labs ordered are listed, but only abnormal results are displayed) Labs Reviewed - No data to display  EKG   Radiology DG Ribs Unilateral W/Chest Right Result Date: 03/03/2024 CLINICAL DATA:  History of fall 3 weeks ago with right posterior rib and back pain EXAM: RIGHT RIBS AND CHEST - 3 VIEW COMPARISON:  Chest radiograph dated 01/13/2024 FINDINGS: Healing mildly displaced right lateral fifth and seventh rib fractures. There is no evidence of pneumothorax or pleural effusion. Patchy right midlung opacity. Decreased left mid lung linear opacity. Heart size and mediastinal contours are within normal limits. Median sternotomy wires are nondisplaced. IMPRESSION: 1. Healing mildly displaced right lateral fifth and seventh rib fractures. No pneumothorax. 2. Patchy right midlung opacity, likely atelectasis. Electronically Signed   By: Limin  Xu M.D.   On: 03/03/2024 13:01    Procedures Procedures (including critical care time)  Medications Ordered in UC Medications - No data to display  Initial Impression / Assessment and Plan / UC Course  I have  reviewed the triage vital signs and the nursing notes.  Pertinent labs & imaging results that were available during my care of the patient were reviewed by me and considered in my medical decision making (see chart for details).     X-ray of the chest and right ribs showing mildly displaced right lateral 5th and 7th rib fractures without pneumothorax.  It did also show some patchy opacities in the mid right lung, and given his recent pneumonia and progressively worsening fatigue, malaise, shortness of breath with oxygen saturations between 89 and 93% on room air while in clinic will cover also for recurring pneumonia with doxycycline .  Lidocaine  patches, heat, over-the-counter pain relievers for pain and close follow-up with primary care provider.  Final Clinical Impressions(s) / UC Diagnoses   Final diagnoses:  Rib pain on right side  Closed fracture of multiple ribs of right side, initial encounter  Opacity of lung on imaging study  History of pneumonia     Discharge Instructions      We will cover for a possible redeveloping pneumonia to the right lung with doxycycline  and I recommend you use the incentive spirometer several times daily consistently to keep your lungs nicely inflated, focusing on deep breaths throughout  the day as well.  The x-ray did show two healing rib fractures.  I have prescribed some numbing patches to place to the area of pain and you may use heat, over-the-counter pain relievers additionally.  Follow-up as soon as possible with your primary care provider for further evaluation, go to the emergency department for worsening symptoms at any time.    ED Prescriptions     Medication Sig Dispense Auth. Provider   doxycycline  (VIBRAMYCIN ) 100 MG capsule Take 1 capsule (100 mg total) by mouth 2 (two) times daily. 14 capsule Stuart Vernell Norris, PA-C   lidocaine  (LIDODERM ) 5 % Place 1 patch onto the skin daily. Remove & Discard patch within 12 hours or as directed  by MD 30 patch Stuart Vernell Norris, PA-C      PDMP not reviewed this encounter.   Stuart Vernell Norris, [place] 03/04/24 2002

## 2024-03-04 NOTE — Progress Notes (Signed)
 [facility] [address], [place] [address]   Clinic Day:  03/05/2024  Referring physician: Clinic, Bonni Lien  Patient Care Team: Clinic, Bonni Lien as PCP - General Court Dorn PARAS, MD as PCP - Cardiology (Cardiology) [place], Peter M, MD as Consulting Physician (Cardiology) Court Dorn PARAS, MD as Consulting Physician (Cardiology) Center, [facility]   ASSESSMENT & PLAN:   Assessment:  1.  Metastatic (lytic) bone lesions: - Presentation: Right anterior chest wall pain since March 2025 - CT chest (12/07/2023): New fracture through the superior endplate of T9 vertebral body at the site of mixed sclerotic and lucent lesion consistent with pathologic fracture.  Patchy bilateral upper lobe nodular airspace disease infectious/inflammatory etiology. - MRI thoracic spine (12/07/2023): Pathologic compression fracture at T9-10 with abnormal enhancement extending into the expanded right pedicle.  Multiple enhancing lesions throughout the thoracic spine.  Multiple rib lesions bilaterally without pathologic rib fracture. - MRI lumbar spine (12/07/2023): Multiple enhancing lesions throughout the lumbar spine, sacrum and iliac bones.  No pathological fracture. - CT angiogram chest (12/09/2020): No pulmonary embolism.  Multifocal pulmonary infiltrates, slightly improved in the left upper lobe.  Multiple lytic lesions within the T7 and T9 vertebral bodies with pathologic fracture of the T9 vertebral body and right 4th and 7th ribs and pathologic fracture of the right seventh rib. - No B symptoms.  Had prior nonmelanoma skin cancer removed on the face. - 12/07/2023: PSA-3.89, SPEP: M spike not observed, immunofixation unremarkable.  Tumor markers including CEA, CA 19-9 and AFP were normal.  FLC ratio was normal with elevated kappa and lambda light chains. - PET scan (12/27/2023): Multifocal mild areas of abnormal uptake along the skeleton including several ribs,  vertebral bodies and scattered along the pelvis.  Some of these lesions are associated with areas of bony sclerosis.  No soft tissue areas of abnormal uptake including lymph nodes and spleen.  L3 vertebral body with maximum SUV 4.3.  Left hemipelvis at the iliac bone maximum SUV 2.3.  Right upper hemisacrum SUV 2.4. - T9 bone biopsy (12/26/2023): Scant fragment of bone with evidence of bony remodeling and minimal marrow space with fibrosis and spindled cells interpreted as myofibroblastic.  An attempt to rule out metastatic disease and possible plasma cell neoplasm, both CK AE1/AE3 and MUM1 were performed and negative. - L3 body biopsy (01/15/2024): Normocellular bone marrow with active hematopoiesis and evidence of normal trilineage maturation.  No metastatic carcinoma identified by CK AE1/AE3 IHC (was performed on decalcified tissue).   2. Social/Family History: -Lives at home with wife and is on disability.  Gait is unsteady due to neuropathy in the feet from diabetes.  He is able to do ADLs and IADLs.  Quit tobacco use 20 years ago of 2 ppd for 40 years. Agent orange exposure in [place]. No asbestos exposure. -Brother had esophageal cancer. No other family history of cancer.   Plan:  1.  Metastatic (lytic) bone lesions: - He had multiple admissions in May and June of this year with multilobar pneumonia. - He was found to have bone lesions, biopsy of the T9 and L3 was negative for malignancy. - We checked his serum quantitative immunoglobulins which showed normal IgG of 910. - He was evaluated in urgent care on 03/03/2024 with right-sided rib pain.  Chest x-ray showed healing fractures.  He was prescribed doxycycline  which he is taking. - There is no evidence of malignancy on repeat biopsies at this time.  Since he had  history of smoking and agent orange exposure, I have recommended repeating CT CAP without IV contrast in 6 months from the last PET scan in May.  We will schedule it in 4 months.  2.   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 - Received INFeD  1 g on 02/05/2024. - Labs on 03/04/2024: Ferritin improved to 336 and saturation 10.  Hemoglobin improved to 10.4. - Energy levels improved.  We will repeat ferritin, iron  panel and CBC in 4 months.   Orders Placed This Encounter  Procedures   CT CHEST ABDOMEN PELVIS WO CONTRAST    Standing Status:   Future    Expected Date:   06/30/2024    Expiration Date:   03/05/2025    Preferred imaging location?:   [facility]    If indicated for the ordered procedure, I authorize the administration of oral contrast media per Radiology protocol:   Yes    Does the patient have a contrast media/X-ray dye allergy?:   No   CBC with Differential    Standing Status:   Future    Expected Date:   06/30/2024    Expiration Date:   09/28/2024   Comprehensive metabolic panel    Standing Status:   Future    Expected Date:   06/30/2024    Expiration Date:   09/28/2024   Lactate dehydrogenase    Standing Status:   Future    Expected Date:   06/30/2024    Expiration Date:   09/28/2024   Iron  and TIBC ([facility] ONLY)    Standing Status:   Future    Expected Date:   06/30/2024    Expiration Date:   09/28/2024   Ferritin    Standing Status:   Future    Expected Date:   06/30/2024    Expiration Date:   09/28/2024      [name],acting as a scribe for [name], MD.,have documented all relevant documentation on the behalf of [name], MD,as directed by  [name], MD while in the presence of [name], MD.  I, [name] MD, have reviewed the above documentation for accuracy and completeness, and I agree with the above.     [name], MD   7/23/202511:09 AM  CHIEF COMPLAINT/PURPOSE OF CONSULT:   Diagnosis: Lytic bone lesions   Cancer Staging  No matching staging information was found for the patient.    Prior  Therapy: None  Current Therapy: Intermittent INFeD    HISTORY OF PRESENT ILLNESS:   Oncology History   No history exists.      [name] is a 78 y.o. male presenting to clinic today for evaluation of lytic bone lesions at the request of Ricky Fines, MD.  Patient has a medical history of TIA, DM type II, peripheral neuropathy, CAD s/p CABG in 2017 and percutaneous coronary angioplasty, peripheral vascular disease, CKD stage 3, hyperlipidemia, GERD, and hypertension.  He was admitted to the hospital from 12/07/23 to 12/10/23 for generalized weakness, fatigue, and SOB. Imaging was concerning for pneumonia and IV antibiotics were given x 3 days. MRI of the thoracic spine incidentally found: Pathologic compression fracture at T9-10 with abnormal enhancement extending into an expanded right pedicle. Multiple enhancing lesions throughout the thoracic spine as described compatible with metastatic disease or multiple myeloma. Multiple rib lesions bilaterally without a pathologic rib fracture. Central T2 hyperintensity in the cord at the T2-3 level is nonspecific but may represent a small focus of myelomalacia.  No significant central canal stenosis. Facet hypertrophy contributes to right greater than left foraminal stenosis from T8-9 through T11-12. Moderate right foraminal stenosis is also present at T7-8.  MRI Lumbar spine also showed: Multiple enhancing lesions throughout the lumbar spine, visualized sacrum and iliac bones compatible with metastatic disease or multiple myeloma. No pathologic fractures. Abnormal signal in the proximal left iliac artery raises concern for occlusion or slow flow. 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 Mild left subarticular and moderate left foraminal stenosis at L2-3. Mild subarticular and moderate foraminal stenosis bilaterally at L3-4, right greater than left. Moderate to severe central canal stenosis at L4-5 with right greater than left subarticular  narrowing. Moderate foraminal stenosis bilaterally at L4-5 is worse on the left. Shallow right paramedian disc protrusion at L5-S1 potentially contacts the traversing right L5 nerve roots. Moderate right foraminal stenosis at L5-S1.  [name] was briefly admitted to the hospital from 12/10/23 to 12/11/23 after a fall due to syncope, in which he hit the back of his head. Imaging showed no acute injuries and CTA was negative for PE. He was found to be orthostatically hypotensive and given IV fluids, as well as IV antibiotics.   Multiple Myeloma Panel fron 12/07/23 showed elevated Alpha 2 globulin  at 1.3 and M-spike was not observed. Urine protein electrophoresis from 12/07/23 was normal.   Today, he states that he is doing well overall. His appetite level is at 100%. His energy level is at 50%. He is accompanied by family members.   [name] states symptoms began after his first hospitalization in February 2025 for pneumonia. His pneumonia has not resolved since. [name] reports he is susceptible to pneumonia due to a medical condition he cannot name. He has regular follow-up with his PCP in [place], Dr. Blondie.  He notes right lower chest wall pain that began in March 2025, after helping his wife put on compression socks. He believes he may have pulled a muscle doing so and pain has improved since its onset. Dwane has a prescription of tramadol  at home but has not required any for pain.  His wife states [name] had back pain during his hospitalization from 12/07/23 to 12/10/23. His daughter reports he was lifting weights strenuously 1 week prior to this and may be attributable to back pain.   [name] reports a dry cough with occasional phlegm that is worsened at night. He notes he is not able to walk straight and has peripheral neuropathy in the form of numbness in the feet. He reports a normal appetite, though he states he does not drink an adequate amount of water . He denies any weakness in the extremities, fevers,  night sweats, or unintentional weight loss.   [name] has a history of skin cancer, not melanoma, and deniesn any other history of cancer. He follows up with Dr. Kerrin for a nodule that [facility] states has not grown in years.   His daughter states he has conjunctival hemorrhage on the left eye and was told while he was hospitalized this may be due to the IV heparin  he received.   INTERVAL HISTORY:   [name] is a 78 y.o. male presenting to the clinic today for follow-up of lytic bone lesions. He was last seen by me on 01/23/2024.  Since his last visit, he  was admitted to the hospital from 01/26/2024 to 01/29/2024 for acute encephalopathy.   Today, he states that he is doing well overall. His appetite level is at 25%. His energy level  is at 25%.   PAST MEDICAL HISTORY:   Past Medical History: 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   x 5   Hypercholesteremia    Hypertension    Neuropathy    Peripheral vascular disease (HCC)    s/p R-L FEM-FEM BYPASS   Pneumonia    Tachycardia    after CABG, pt on medicine for this    Surgical History: Past Surgical History:  Procedure Laterality Date   ABDOMINAL AORTOGRAM Bilateral 09/19/2016   Procedure: iliac;  Surgeon: Gaile LELON New, MD;  [facility]: [facility] IN[facility];  Service: Cardiovascular;  Laterality: Bilateral;   ABDOMINAL AORTOGRAM W/LOWER EXTREMITY N/A 09/22/2021   Procedure: ABDOMINAL AORTOGRAM W/LOWER EXTREMITY;  Surgeon: Gretta Lonni PARAS, MD;  Location: [facility];  Service: Cardiovascular;  Laterality: N/A;   ABDOMINAL AORTOGRAM W/LOWER EXTREMITY N/A 10/05/2022   Procedure:  ABDOMINAL AORTOGRAM W/LOWER EXTREMITY;  Surgeon: Gretta Lonni PARAS, MD;  Location: [facility];  Service: Cardiovascular;  Laterality: N/A;   BIOPSY  05/16/2023   Procedure: BIOPSY;  Surgeon: Shaaron Lamar HERO, MD;  [facility]: [facility];  Service: Endoscopy;;   CARDIAC CATHETERIZATION  2003   with stent   CARDIAC CATHETERIZATION N/A 10/11/2015   Procedure: Left Heart Cath and Coronary Angiography;  Surgeon: Peter M Swaziland, MD;  Location: [facility] INVASIVE CV LAB;  Service: Cardiovascular;  Laterality: N/A;   COLON RESECTION N/A 12/13/2017   Procedure: EXPLORATORY LAPAROTOMY, SIGMOID COLECTOMY WITH COLOSTOMY;  Surgeon: Belinda Cough, MD;  Location: [facility] OR;  Service: General;  Laterality: N/A;   COLONOSCOPY N/A 09/22/2013   Procedure: COLONOSCOPY;  Surgeon: Lamar HERO Shaaron, MD;  [facility]: [facility];  Service: Endoscopy;  Laterality: N/A;  9:30 AM   COLONOSCOPY N/A 02/19/2018   Procedure: COLONOSCOPY;  Surgeon: Shaaron Lamar HERO, MD;  [facility]: [facility];  Service: Endoscopy;  Laterality: N/A;  2:00pm   COLONOSCOPY WITH PROPOFOL  N/A 05/16/2023   Procedure: COLONOSCOPY WITH PROPOFOL ;  Surgeon: Shaaron Lamar HERO, MD;  [facility]: [facility];  Service: Endoscopy;  Laterality: N/A;  730am, asa 3   COLOSTOMY N/A 12/13/2017   Procedure: COLOSTOMY;  Surgeon: Belinda Cough, MD;  Location: [facility] OR;  Service: General;  Laterality: N/A;   COLOSTOMY REVERSAL N/A 03/26/2018   Procedure: COLOSTOMY REVERSAL;  Surgeon: Belinda Cough, MD;  Location: [facility] OR;  Service: General;  Laterality: N/A;   CORONARY ARTERY BYPASS GRAFT N/A 10/18/2015   Procedure: CORONARY ARTERY BYPASS GRAFTING (CABG) x  five, using left internal mammary artery and right leg greater saphenous vein harvested endoscopically;  Surgeon: Elspeth JAYSON Millers, MD;  Location: [facility] OR;  Service: Open Heart Surgery;  Laterality: N/A;   ENDARTERECTOMY FEMORAL Right 08/09/2016   Procedure: ENDARTERECTOMY FEMORAL WITH VEIN PATCH ANGIOPLASTY;   Surgeon: Gaile LELON New, MD;  Location: [facility] OR;  Service: Vascular;  Laterality: Right;   ESOPHAGOGASTRODUODENOSCOPY N/A 10/24/2017   Dr. Shaaron: hiatal hernia   ESOPHAGOGASTRODUODENOSCOPY N/A 01/01/2024   Procedure: EGD (ESOPHAGOGASTRODUODENOSCOPY);  Surgeon: Cinderella Deatrice FALCON, MD;  [facility];  Service: Endoscopy;  Laterality: N/A;   ESOPHAGOGASTRODUODENOSCOPY (EGD) WITH PROPOFOL  N/A 05/16/2023  Procedure: ESOPHAGOGASTRODUODENOSCOPY (EGD) WITH PROPOFOL ;  Surgeon: Shaaron Lamar HERO, MD;  [facility];  Service: Endoscopy;  Laterality: N/A;   FEMORAL-FEMORAL BYPASS GRAFT Bilateral 08/09/2016   Procedure: REVISION BYPASS GRAFT RIGHT FEMORAL-LEFT FEMORAL ARTERY;  Surgeon: Gaile LELON New, MD;  Location: [facility];  Service: Vascular;  Laterality: Bilateral;   FEMORAL-POPLITEAL BYPASS GRAFT     INTRAMEDULLARY (IM) NAIL INTERTROCHANTERIC Left 04/03/2021   Procedure: LEFT  HIP INTRAMEDULLARY (IM) NAIL INTERTROCHANTRIC;  Surgeon: Vernetta Lonni GRADE, MD;  Location: [facility];  Service: Orthopedics;  Laterality: Left;   IR FLUORO GUIDED NEEDLE PLC ASPIRATION/INJECTION LOC  12/26/2023   IR IVC FILTER RETRIEVAL / S&I PORTER GUID/MOD SED  01/15/2024   MALONEY DILATION N/A 05/16/2023   Procedure: AGAPITO DILATION;  Surgeon: Shaaron Lamar HERO, MD;  [facility];  Service: Endoscopy;  Laterality: N/A;   PERIPHERAL VASCULAR BALLOON ANGIOPLASTY Right 09/22/2021   Procedure: PERIPHERAL VASCULAR BALLOON ANGIOPLASTY;  Surgeon: Gretta Lonni PARAS, MD;  [facility];  Service: Cardiovascular;  Laterality: Right;   PERIPHERAL VASCULAR CATHETERIZATION N/A 05/08/2016   Procedure: Lower Extremity Angiography;  Surgeon: Dorn PARAS Lesches, MD;  Location: [facility] INVASIVE CV LAB;  Service: Cardiovascular;  Laterality: N/A;   PERIPHERAL VASCULAR INTERVENTION Right 09/19/2016   Procedure: Peripheral Vascular Intervention;  Surgeon: Gaile LELON New, MD;  [facility];   Service: Cardiovascular;  Laterality: Right;  ext iliac stent   PERIPHERAL VASCULAR INTERVENTION Right 09/22/2021   Procedure: PERIPHERAL VASCULAR INTERVENTION;  Surgeon: Gretta Lonni PARAS, MD;  [facility];  Service: Cardiovascular;  Laterality: Right;   POLYPECTOMY  02/19/2018   Procedure: POLYPECTOMY;  Surgeon: Shaaron Lamar HERO, MD;  [facility];  Service: Endoscopy;;   PROCTOSCOPY  10/24/2017   Procedure: PROCTOSCOPY;  Surgeon: Shaaron Lamar HERO, MD;  [facility];  Service: Endoscopy;;   TEE WITHOUT CARDIOVERSION N/A 10/18/2015   Procedure: TRANSESOPHAGEAL ECHOCARDIOGRAM (TEE);  Surgeon: Elspeth JAYSON Millers, MD;  Location: [facility] OR;  Service: Open Heart Surgery;  Laterality: N/A;    Social History: Social History   Socioeconomic History   Marital status: Married    Spouse name: Not on file   Number of children: Not on file   Years of education: Not on file   Highest education level: Not on file  Occupational History   Not on file  Tobacco Use   Smoking status: Former    Current packs/day: 0.00    Average packs/day: 2.0 packs/day for 30.0 years (60.0 ttl pk-yrs)    Types: Cigarettes    Start date: 08/14/1970    Quit date: 08/14/2000    Years since quitting: 23.5   Smokeless tobacco: Never  Vaping Use   Vaping status: Never Used  Substance and Sexual Activity   Alcohol  use: No   Drug use: No   Sexual activity: Not Currently  Other Topics Concern   Not on file  Social History Narrative   Not on file   Social Drivers of Health   Financial Resource Strain: Low Risk  (10/08/2023)   Received from [facility]   Overall Financial Resource Strain (CARDIA)    Difficulty of Paying Living Expenses: Not very hard  Food Insecurity: No Food Insecurity (01/28/2024)   Hunger Vital Sign    Worried About Running Out of Food in the Last Year: Never true    Ran Out of Food in the Last Year: Never true  Recent Concern: Food  Insecurity - Food  Insecurity Present (12/18/2023)   Hunger Vital Sign    Worried About Running Out of Food in the Last Year: Sometimes true    Ran Out of Food in the Last Year: Sometimes true  Transportation Needs: No Transportation Needs (01/28/2024)   PRAPARE - Administrator, Civil Service (Medical): No    Lack of Transportation (Non-Medical): No  Physical Activity: Not on file  Stress: Not on file  Social Connections: Socially Integrated (01/28/2024)   Social Connection and Isolation Panel    Frequency of Communication with Friends and Family: More than three times a week    Frequency of Social Gatherings with Friends and Family: Three times a week    Attends Religious Services: More than 4 times per year    Active Member of Clubs or Organizations: Yes    Attends Banker Meetings: 1 to 4 times per year    Marital Status: Married  Catering manager Violence: Not At Risk (01/28/2024)   Humiliation, Afraid, Rape, and Kick questionnaire    Fear of Current or Ex-Partner: No    Emotionally Abused: No    Physically Abused: No    Sexually Abused: No    Family History: Family History  Problem Relation Age of Onset   Heart attack Mother    Stroke Mother    Colon cancer Brother        late 20s    Current Medications:  Current Outpatient Medications:    albuterol  (VENTOLIN  HFA) 108 (90 Base) MCG/ACT inhaler, Inhale 2 puffs into the lungs every 6 (six) hours as needed for wheezing or shortness of breath., Disp: 8 g, Rfl: 2   amLODipine  (NORVASC ) 10 MG tablet, Take 10 mg by mouth in the morning., Disp: , Rfl:    aspirin  EC 81 MG tablet, Take 1 tablet (81 mg total) by mouth daily with breakfast., Disp: 30 tablet, Rfl: 2   clopidogrel  (PLAVIX ) 75 MG tablet, Take 75 mg by mouth in the morning., Disp: , Rfl:    doxycycline  (VIBRAMYCIN ) 100 MG capsule, Take 1 capsule (100 mg total) by mouth 2 (two) times daily., Disp: 14 capsule, Rfl: 0   Fluticasone-Umeclidin-Vilant (TRELEGY ELLIPTA)  200-62.5-25 MCG/ACT AEPB, Inhale 2 puffs into the lungs daily., Disp: , Rfl:    gabapentin  (NEURONTIN ) 300 MG capsule, Take 1 capsule (300 mg total) by mouth 2 (two) times daily., Disp: 60 capsule, Rfl: 0   insulin  glargine (LANTUS ) 100 UNIT/ML injection, Inject 0.1 mLs (10 Units total) into the skin at bedtime., Disp: , Rfl:    lidocaine  (LIDODERM ) 5 %, Place 1 patch onto the skin daily. Remove & Discard patch within 12 hours or as directed by MD, Disp: 30 patch, Rfl: 0   losartan  (COZAAR ) 50 MG tablet, Take 50 mg by mouth daily., Disp: , Rfl:    magnesium  oxide (MAG-OX) 400 MG tablet, Take 400 mg by mouth daily., Disp: , Rfl:    metoprolol  tartrate (LOPRESSOR ) 50 MG tablet, Take 25 mg by mouth 2 (two) times daily. , Disp: , Rfl:    pantoprazole  (PROTONIX ) 40 MG tablet, Take 1 tablet (40 mg total) by mouth 2 (two) times daily., Disp: 60 tablet, Rfl: 0   vitamin B-12 (CYANOCOBALAMIN ) 1000 MCG tablet, Take 1,000 mcg by mouth in the morning., Disp: , Rfl:    naproxen (NAPROSYN) 500 MG tablet, Take 500 mg by mouth 2 (two) times daily. (Patient not taking: Reported on 03/05/2024), Disp: , Rfl:  Allergies: Allergies  Allergen Reactions   Baclofen Other (See Comments)    Altered mental status, extreme drowsiness   Acarbose Diarrhea    REVIEW OF SYSTEMS:   Review of Systems  Constitutional:  Positive for fatigue. Negative for chills and fever.  HENT:   Negative for lump/mass, mouth sores, nosebleeds, sore throat and trouble swallowing.   Eyes:  Negative for eye problems.  Respiratory:  Positive for shortness of breath. Negative for cough.   Cardiovascular:  Positive for chest pain. Negative for leg swelling and palpitations.  Gastrointestinal:  Positive for constipation and vomiting. Negative for abdominal pain, diarrhea and nausea.  Genitourinary:  Negative for bladder incontinence, difficulty urinating, dysuria, frequency, hematuria and nocturia.   Musculoskeletal:  Negative for arthralgias,  back pain, flank pain, myalgias and neck pain.  Skin:  Negative for itching and rash.  Neurological:  Negative for dizziness, headaches and numbness.  Hematological:  Bruises/bleeds easily.  Psychiatric/Behavioral:  Negative for depression, sleep disturbance and suicidal ideas. The patient is not nervous/anxious.   All other systems reviewed and are negative.    VITALS:   Blood pressure 139/62, temperature 97.9 F (36.6 C), temperature source Tympanic, resp. rate 18, weight 134 lb 4.2 oz (60.9 kg), SpO2 96%.  Wt Readings from Last 3 Encounters:  03/05/24 134 lb 4.2 oz (60.9 kg)  02/19/24 138 lb (62.6 kg)  01/26/24 138 lb (62.6 kg)    Body mass index is 21.03 kg/m.  Performance status (ECOG): 1 - Symptomatic but completely ambulatory  PHYSICAL EXAM:   Physical Exam Vitals and nursing note reviewed. Exam conducted with a chaperone present.  Constitutional:      Appearance: Normal appearance.  Cardiovascular:     Rate and Rhythm: Normal rate and regular rhythm.     Pulses: Normal pulses.     Heart sounds: Normal heart sounds.  Pulmonary:     Effort: Pulmonary effort is normal.     Breath sounds: Normal breath sounds.  Abdominal:     Palpations: Abdomen is soft. There is no hepatomegaly, splenomegaly or mass.     Tenderness: There is no abdominal tenderness.  Musculoskeletal:     Right lower leg: No edema.     Left lower leg: No edema.  Lymphadenopathy:     Cervical: No cervical adenopathy.     Right cervical: No superficial, deep or posterior cervical adenopathy.    Left cervical: No superficial, deep or posterior cervical adenopathy.     Upper Body:     Right upper body: No supraclavicular or axillary adenopathy.     Left upper body: No supraclavicular or axillary adenopathy.  Neurological:     General: No focal deficit present.     Mental Status: He is alert and oriented to person, place, and time.  Psychiatric:        Mood and Affect: Mood normal.        Behavior:  Behavior normal.     LABS:   CBC    Component Value Date/Time   WBC 18.5 (H) 03/04/2024 0958   RBC 4.01 (L) 03/04/2024 0958   HGB 10.4 (L) 03/04/2024 0958   HGB 10.8 (L) 02/20/2023 1253   HCT 32.7 (L) 03/04/2024 0958   HCT 34.6 (L) 02/20/2023 1253   PLT 572 (H) 03/04/2024 0958   PLT 456 (H) 02/20/2023 1253   MCV 81.5 03/04/2024 0958   MCV 81 02/20/2023 1253   MCH 25.9 (L) 03/04/2024 0958   MCHC 31.8 03/04/2024 0958   RDW  21.2 (H) 03/04/2024 0958   RDW 15.4 02/20/2023 1253   LYMPHSABS 1.0 01/23/2024 1029   LYMPHSABS 1.3 02/20/2023 1253   MONOABS 1.1 (H) 01/23/2024 1029   EOSABS 0.3 01/23/2024 1029   EOSABS 0.3 02/20/2023 1253   BASOSABS 0.1 01/23/2024 1029   BASOSABS 0.1 02/20/2023 1253    CMP    Component Value Date/Time   NA 138 01/29/2024 0445   NA 139 02/20/2023 1253   K 3.4 (L) 01/29/2024 0445   CL 106 01/29/2024 0445   CO2 19 (L) 01/29/2024 0445   GLUCOSE 125 (H) 01/29/2024 0445   BUN 20 01/29/2024 0445   BUN 24 02/20/2023 1253   CREATININE 1.54 (H) 01/29/2024 0445   CREATININE 1.28 (H) 04/25/2016 1038   CALCIUM  8.7 (L) 01/29/2024 0445   PROT 7.3 01/26/2024 1251   PROT 6.8 02/20/2023 1253   ALBUMIN  3.2 (L) 01/26/2024 1251   ALBUMIN  4.3 02/20/2023 1253   AST 12 (L) 01/26/2024 1251   ALT 11 01/26/2024 1251   ALKPHOS 126 01/26/2024 1251   BILITOT 0.7 01/26/2024 1251   BILITOT 0.4 02/20/2023 1253   GFRNONAA 46 (L) 01/29/2024 0445   GFRAA >60 05/22/2019 0611     Lab Results  Component Value Date   CEA1 3.6 12/18/2023   /  CEA  Date Value Ref Range Status  12/18/2023 3.6 0.0 - 4.7 ng/mL Final    Comment:    (NOTE)                             Nonsmokers          <3.9                             Smokers             <5.6 [employer] Electrochemiluminescence Immunoassay (ECLIA) Values obtained with different assay methods or kits cannot be used interchangeably.  Results cannot be interpreted as absolute evidence of the presence or absence  of malignant disease. Performed At: [facility] [address] [place], [place] [id number] Jennette Shorter MD Ey:[id number]    No results found for: PSA1 Lab Results  Component Value Date   RJW800 17 12/18/2023   No results found for: RJW874  Lab Results  Component Value Date   TOTALPROTELP 7.0 12/07/2023   Lab Results  Component Value Date   TIBC 197 (L) 03/04/2024   TIBC 275 01/23/2024   FERRITIN 336 03/04/2024   FERRITIN 38 01/23/2024   IRONPCTSAT 10 (L) 03/04/2024   IRONPCTSAT 5 (L) 01/23/2024   Lab Results  Component Value Date   LDH 157 01/23/2024   LDH 162 12/18/2023     STUDIES:   DG Ribs Unilateral W/Chest Right Result Date: 03/03/2024 CLINICAL DATA:  History of fall 3 weeks ago with right posterior rib and back pain EXAM: RIGHT RIBS AND CHEST - 3 VIEW COMPARISON:  Chest radiograph dated 01/13/2024 FINDINGS: Healing mildly displaced right lateral fifth and seventh rib fractures. There is no evidence of pneumothorax or pleural effusion. Patchy right midlung opacity. Decreased left mid lung linear opacity. Heart size and mediastinal contours are within normal limits. Median sternotomy wires are nondisplaced. IMPRESSION: 1. Healing mildly displaced right lateral fifth and seventh rib fractures. No pneumothorax. 2. Patchy right midlung opacity, likely atelectasis. Electronically Signed   By: Limin  Xu M.D.   On: 03/03/2024 13:01

## 2024-03-05 ENCOUNTER — Inpatient Hospital Stay: Admitting: Hematology

## 2024-03-05 VITALS — BP 139/62 | Temp 97.9°F | Resp 18 | Wt 134.3 lb

## 2024-03-05 DIAGNOSIS — M898X9 Other specified disorders of bone, unspecified site: Secondary | ICD-10-CM | POA: Diagnosis not present

## 2024-03-05 DIAGNOSIS — D508 Other iron deficiency anemias: Secondary | ICD-10-CM | POA: Diagnosis not present

## 2024-03-05 DIAGNOSIS — C7951 Secondary malignant neoplasm of bone: Secondary | ICD-10-CM | POA: Diagnosis not present

## 2024-03-05 NOTE — Patient Instructions (Addendum)
 [facility] at [facility] Discharge Instructions   You were seen and examined today by Dr. Rogers.  He reviewed the results of your lab work which are normal/stable. Your iron  levels and hemoglobin have improved since you received the iron  infusion.   We will see you back in 4 months. We will repeat a CT scan and lab work prior to this visit.   Return as scheduled.    Thank you for choosing [facility] at [facility] to provide your oncology and hematology care.  To afford each patient quality time with our provider, please arrive at least 15 minutes before your scheduled appointment time.   If you have a lab appointment with the Cancer Center please come in thru the Main Entrance and check in at the main information desk.  You need to re-schedule your appointment should you arrive 10 or more minutes late.  We strive to give you quality time with our providers, and arriving late affects you and other patients whose appointments are after yours.  Also, if you no show three or more times for appointments you may be dismissed from the clinic at the providers discretion.     Again, thank you for choosing [facility].  Our hope is that these requests will decrease the amount of time that you wait before being seen by our physicians.       _____________________________________________________________  Should you have questions after your visit to [facility], please contact our office at [phone] and follow the prompts.  Our office hours are 8:00 a.m. and 4:30 p.m. Monday - Friday.  Please note that voicemails left after 4:00 p.m. may not be returned until the following business day.  We are closed weekends and major holidays.  You do have access to a nurse 24-7, just call the main number to the clinic [phone] and do not press any options, hold on the line and a nurse will answer the phone.    For prescription  refill requests, have your pharmacy contact our office and allow 72 hours.    Due to Covid, you will need to wear a mask upon entering the hospital. If you do not have a mask, a mask will be given to you at the Main Entrance upon arrival. For doctor visits, patients may have 1 support person age 55 or older with them. For treatment visits, patients can not have anyone with them due to social distancing guidelines and our immunocompromised population.

## 2024-03-21 ENCOUNTER — Emergency Department (HOSPITAL_COMMUNITY)

## 2024-03-21 ENCOUNTER — Inpatient Hospital Stay (HOSPITAL_COMMUNITY)
Admission: EM | Admit: 2024-03-21 | Discharge: 2024-03-23 | DRG: 871 | Disposition: A | Discharge status: Home / Self Care | Attending: Family Medicine | Admitting: Family Medicine

## 2024-03-21 ENCOUNTER — Other Ambulatory Visit: Payer: Self-pay

## 2024-03-21 ENCOUNTER — Encounter (HOSPITAL_COMMUNITY): Payer: Self-pay

## 2024-03-21 DIAGNOSIS — N183 Chronic kidney disease, stage 3 unspecified: Secondary | ICD-10-CM | POA: Diagnosis present

## 2024-03-21 DIAGNOSIS — I2510 Atherosclerotic heart disease of native coronary artery without angina pectoris: Secondary | ICD-10-CM | POA: Diagnosis present

## 2024-03-21 DIAGNOSIS — Z794 Long term (current) use of insulin: Secondary | ICD-10-CM

## 2024-03-21 DIAGNOSIS — Z951 Presence of aortocoronary bypass graft: Secondary | ICD-10-CM

## 2024-03-21 DIAGNOSIS — N179 Acute kidney failure, unspecified: Secondary | ICD-10-CM | POA: Diagnosis present

## 2024-03-21 DIAGNOSIS — E1140 Type 2 diabetes mellitus with diabetic neuropathy, unspecified: Secondary | ICD-10-CM | POA: Diagnosis present

## 2024-03-21 DIAGNOSIS — Z8249 Family history of ischemic heart disease and other diseases of the circulatory system: Secondary | ICD-10-CM

## 2024-03-21 DIAGNOSIS — Z955 Presence of coronary angioplasty implant and graft: Secondary | ICD-10-CM

## 2024-03-21 DIAGNOSIS — E7800 Pure hypercholesterolemia, unspecified: Secondary | ICD-10-CM | POA: Diagnosis present

## 2024-03-21 DIAGNOSIS — E1142 Type 2 diabetes mellitus with diabetic polyneuropathy: Secondary | ICD-10-CM | POA: Diagnosis present

## 2024-03-21 DIAGNOSIS — E1151 Type 2 diabetes mellitus with diabetic peripheral angiopathy without gangrene: Secondary | ICD-10-CM | POA: Diagnosis present

## 2024-03-21 DIAGNOSIS — I129 Hypertensive chronic kidney disease with stage 1 through stage 4 chronic kidney disease, or unspecified chronic kidney disease: Secondary | ICD-10-CM | POA: Diagnosis present

## 2024-03-21 DIAGNOSIS — N1831 Chronic kidney disease, stage 3a: Secondary | ICD-10-CM | POA: Diagnosis present

## 2024-03-21 DIAGNOSIS — E1122 Type 2 diabetes mellitus with diabetic chronic kidney disease: Secondary | ICD-10-CM | POA: Diagnosis present

## 2024-03-21 DIAGNOSIS — Z7902 Long term (current) use of antithrombotics/antiplatelets: Secondary | ICD-10-CM

## 2024-03-21 DIAGNOSIS — Z823 Family history of stroke: Secondary | ICD-10-CM

## 2024-03-21 DIAGNOSIS — Z7984 Long term (current) use of oral hypoglycemic drugs: Secondary | ICD-10-CM

## 2024-03-21 DIAGNOSIS — E1165 Type 2 diabetes mellitus with hyperglycemia: Secondary | ICD-10-CM | POA: Diagnosis present

## 2024-03-21 DIAGNOSIS — J189 Pneumonia, unspecified organism: Principal | ICD-10-CM | POA: Diagnosis present

## 2024-03-21 DIAGNOSIS — Z888 Allergy status to other drugs, medicaments and biological substances status: Secondary | ICD-10-CM

## 2024-03-21 DIAGNOSIS — Z87891 Personal history of nicotine dependence: Secondary | ICD-10-CM | POA: Diagnosis not present

## 2024-03-21 DIAGNOSIS — D631 Anemia in chronic kidney disease: Secondary | ICD-10-CM | POA: Diagnosis present

## 2024-03-21 DIAGNOSIS — R109 Unspecified abdominal pain: Secondary | ICD-10-CM | POA: Diagnosis present

## 2024-03-21 DIAGNOSIS — K219 Gastro-esophageal reflux disease without esophagitis: Secondary | ICD-10-CM | POA: Diagnosis present

## 2024-03-21 DIAGNOSIS — Z7985 Long-term (current) use of injectable non-insulin antidiabetic drugs: Secondary | ICD-10-CM

## 2024-03-21 DIAGNOSIS — E871 Hypo-osmolality and hyponatremia: Secondary | ICD-10-CM | POA: Diagnosis present

## 2024-03-21 DIAGNOSIS — A419 Sepsis, unspecified organism: Principal | ICD-10-CM | POA: Diagnosis present

## 2024-03-21 DIAGNOSIS — Z79899 Other long term (current) drug therapy: Secondary | ICD-10-CM

## 2024-03-21 DIAGNOSIS — Z7982 Long term (current) use of aspirin: Secondary | ICD-10-CM | POA: Diagnosis not present

## 2024-03-21 DIAGNOSIS — I739 Peripheral vascular disease, unspecified: Secondary | ICD-10-CM | POA: Diagnosis present

## 2024-03-21 DIAGNOSIS — Z9862 Peripheral vascular angioplasty status: Secondary | ICD-10-CM

## 2024-03-21 DIAGNOSIS — M8448XD Pathological fracture, other site, subsequent encounter for fracture with routine healing: Secondary | ICD-10-CM | POA: Diagnosis present

## 2024-03-21 DIAGNOSIS — Z800 Family history of malignant neoplasm of digestive organs: Secondary | ICD-10-CM

## 2024-03-21 DIAGNOSIS — Z7951 Long term (current) use of inhaled steroids: Secondary | ICD-10-CM

## 2024-03-21 DIAGNOSIS — T502X5A Adverse effect of carbonic-anhydrase inhibitors, benzothiadiazides and other diuretics, initial encounter: Secondary | ICD-10-CM | POA: Diagnosis present

## 2024-03-21 LAB — BASIC METABOLIC PANEL WITH GFR
Anion gap: 15 (ref 5–15)
BUN: 33 mg/dL — ABNORMAL HIGH (ref 8–23)
CO2: 20 mmol/L — ABNORMAL LOW (ref 22–32)
Calcium: 8.7 mg/dL — ABNORMAL LOW (ref 8.9–10.3)
Chloride: 99 mmol/L (ref 98–111)
Creatinine, Ser: 2.62 mg/dL — ABNORMAL HIGH (ref 0.61–1.24)
GFR, Estimated: 25 mL/min — ABNORMAL LOW (ref >60)
Glucose, Bld: 341 mg/dL — ABNORMAL HIGH (ref 70–99)
Potassium: 3.8 mmol/L (ref 3.5–5.1)
Sodium: 134 mmol/L — ABNORMAL LOW (ref 135–145)

## 2024-03-21 LAB — LACTIC ACID, PLASMA: Lactic Acid, Venous: 1.8 mmol/L (ref 0.5–1.9)

## 2024-03-21 LAB — CBC WITH DIFFERENTIAL/PLATELET
Abs Immature Granulocytes: 0.23 K/uL — ABNORMAL HIGH (ref 0.00–0.07)
Basophils Absolute: 0.1 K/uL (ref 0.0–0.1)
Basophils Relative: 0 %
Eosinophils Absolute: 0.1 K/uL (ref 0.0–0.5)
Eosinophils Relative: 0 %
HCT: 28.1 % — ABNORMAL LOW (ref 39.0–52.0)
Hemoglobin: 9 g/dL — ABNORMAL LOW (ref 13.0–17.0)
Immature Granulocytes: 1 %
Lymphocytes Relative: 6 %
Lymphs Abs: 1.2 K/uL (ref 0.7–4.0)
MCH: 26.3 pg (ref 26.0–34.0)
MCHC: 32 g/dL (ref 30.0–36.0)
MCV: 82.2 fL (ref 80.0–100.0)
Monocytes Absolute: 1.4 K/uL — ABNORMAL HIGH (ref 0.1–1.0)
Monocytes Relative: 6 %
Neutro Abs: 19.1 K/uL — ABNORMAL HIGH (ref 1.7–7.7)
Neutrophils Relative %: 87 %
Platelets: 492 K/uL — ABNORMAL HIGH (ref 150–400)
RBC: 3.42 MIL/uL — ABNORMAL LOW (ref 4.22–5.81)
RDW: 19.8 % — ABNORMAL HIGH (ref 11.5–15.5)
WBC: 22.2 K/uL — ABNORMAL HIGH (ref 4.0–10.5)
nRBC: 0 % (ref 0.0–0.2)

## 2024-03-21 LAB — HEPATIC FUNCTION PANEL
ALT: 16 U/L (ref 0–44)
AST: 17 U/L (ref 15–41)
Albumin: 2.9 g/dL — ABNORMAL LOW (ref 3.5–5.0)
Alkaline Phosphatase: 115 U/L (ref 38–126)
Bilirubin, Direct: 0.1 mg/dL (ref 0.0–0.2)
Indirect Bilirubin: 0.5 mg/dL (ref 0.3–0.9)
Total Bilirubin: 0.6 mg/dL (ref 0.0–1.2)
Total Protein: 7.3 g/dL (ref 6.5–8.1)

## 2024-03-21 LAB — GLUCOSE, CAPILLARY: Glucose-Capillary: 255 mg/dL — ABNORMAL HIGH (ref 70–99)

## 2024-03-21 MED ORDER — SODIUM CHLORIDE 0.9 % IV SOLN
500.0000 mg | Freq: Once | INTRAVENOUS | Status: AC
  Administered 2024-03-21: 500 mg via INTRAVENOUS
  Filled 2024-03-21: qty 5

## 2024-03-21 MED ORDER — INSULIN GLARGINE-YFGN 100 UNIT/ML ~~LOC~~ SOLN
20.0000 [IU] | Freq: Every day | SUBCUTANEOUS | Status: DC
  Administered 2024-03-21 – 2024-03-22 (×2): 20 [IU] via SUBCUTANEOUS
  Filled 2024-03-21 (×3): qty 0.2

## 2024-03-21 MED ORDER — SODIUM CHLORIDE 0.9% FLUSH
3.0000 mL | Freq: Two times a day (BID) | INTRAVENOUS | Status: DC
  Administered 2024-03-22 – 2024-03-23 (×2): 3 mL via INTRAVENOUS

## 2024-03-21 MED ORDER — CILOSTAZOL 100 MG PO TABS
100.0000 mg | ORAL_TABLET | Freq: Two times a day (BID) | ORAL | Status: DC
  Administered 2024-03-21 – 2024-03-23 (×4): 100 mg via ORAL
  Filled 2024-03-21 (×4): qty 1

## 2024-03-21 MED ORDER — SODIUM CHLORIDE 0.9 % IV SOLN
2.0000 g | INTRAVENOUS | Status: DC
  Administered 2024-03-21 – 2024-03-22 (×2): 2 g via INTRAVENOUS
  Filled 2024-03-21 (×2): qty 20

## 2024-03-21 MED ORDER — SODIUM CHLORIDE 0.9 % IV SOLN
500.0000 mg | INTRAVENOUS | Status: DC
  Administered 2024-03-21 – 2024-03-22 (×2): 500 mg via INTRAVENOUS
  Filled 2024-03-21 (×2): qty 5

## 2024-03-21 MED ORDER — MAGNESIUM OXIDE -MG SUPPLEMENT 400 (240 MG) MG PO TABS
400.0000 mg | ORAL_TABLET | Freq: Two times a day (BID) | ORAL | Status: DC
  Administered 2024-03-21 – 2024-03-23 (×4): 400 mg via ORAL
  Filled 2024-03-21 (×6): qty 1

## 2024-03-21 MED ORDER — PANTOPRAZOLE SODIUM 40 MG PO TBEC
40.0000 mg | DELAYED_RELEASE_TABLET | Freq: Two times a day (BID) | ORAL | Status: DC
  Administered 2024-03-21 – 2024-03-23 (×4): 40 mg via ORAL
  Filled 2024-03-21 (×4): qty 1

## 2024-03-21 MED ORDER — ACETAMINOPHEN 325 MG PO TABS: 650.0000 mg | ORAL_TABLET | Freq: Four times a day (QID) | ORAL | Status: DC | PRN

## 2024-03-21 MED ORDER — ONDANSETRON HCL 4 MG/2ML IJ SOLN: 4.0000 mg | Freq: Four times a day (QID) | INTRAMUSCULAR | Status: DC | PRN

## 2024-03-21 MED ORDER — SODIUM CHLORIDE 0.9% FLUSH
3.0000 mL | Freq: Two times a day (BID) | INTRAVENOUS | Status: DC
  Administered 2024-03-22 (×2): 3 mL via INTRAVENOUS

## 2024-03-21 MED ORDER — ASPIRIN 81 MG PO TBEC
81.0000 mg | DELAYED_RELEASE_TABLET | Freq: Every day | ORAL | Status: DC
  Administered 2024-03-22 – 2024-03-23 (×2): 81 mg via ORAL
  Filled 2024-03-21 (×2): qty 1

## 2024-03-21 MED ORDER — SODIUM CHLORIDE 0.9 % IV BOLUS
1000.0000 mL | Freq: Once | INTRAVENOUS | Status: AC
  Administered 2024-03-21: 1000 mL via INTRAVENOUS

## 2024-03-21 MED ORDER — AMLODIPINE BESYLATE 5 MG PO TABS
10.0000 mg | ORAL_TABLET | Freq: Every morning | ORAL | Status: DC
  Administered 2024-03-22 – 2024-03-23 (×2): 10 mg via ORAL
  Filled 2024-03-21 (×2): qty 2

## 2024-03-21 MED ORDER — INSULIN ASPART 100 UNIT/ML IJ SOLN
3.0000 [IU] | Freq: Three times a day (TID) | INTRAMUSCULAR | Status: DC
  Administered 2024-03-22 (×3): 3 [IU] via SUBCUTANEOUS

## 2024-03-21 MED ORDER — ONDANSETRON HCL 4 MG PO TABS: 4.0000 mg | ORAL_TABLET | Freq: Four times a day (QID) | ORAL | Status: DC | PRN

## 2024-03-21 MED ORDER — VITAMIN B-12 1000 MCG PO TABS
1000.0000 ug | ORAL_TABLET | Freq: Every morning | ORAL | Status: DC
  Administered 2024-03-22 – 2024-03-23 (×2): 1000 ug via ORAL
  Filled 2024-03-21 (×2): qty 1

## 2024-03-21 MED ORDER — METOPROLOL SUCCINATE ER 25 MG PO TB24
25.0000 mg | ORAL_TABLET | Freq: Every day | ORAL | Status: DC
Start: 2024-03-22 — End: 2024-03-23
  Administered 2024-03-22 – 2024-03-23 (×2): 25 mg via ORAL
  Filled 2024-03-21 (×2): qty 1

## 2024-03-21 MED ORDER — ACETAMINOPHEN 650 MG RE SUPP: 650.0000 mg | Freq: Four times a day (QID) | RECTAL | Status: DC | PRN

## 2024-03-21 MED ORDER — DM-GUAIFENESIN ER 30-600 MG PO TB12
1.0000 | ORAL_TABLET | Freq: Two times a day (BID) | ORAL | Status: DC
  Administered 2024-03-21 – 2024-03-23 (×4): 1 via ORAL
  Filled 2024-03-21 (×4): qty 1

## 2024-03-21 MED ORDER — TRAZODONE HCL 50 MG PO TABS: 50.0000 mg | ORAL_TABLET | Freq: Every evening | ORAL | Status: DC | PRN

## 2024-03-21 MED ORDER — IPRATROPIUM-ALBUTEROL 0.5-2.5 (3) MG/3ML IN SOLN
3.0000 mL | Freq: Four times a day (QID) | RESPIRATORY_TRACT | Status: DC
  Administered 2024-03-21: 3 mL via RESPIRATORY_TRACT
  Filled 2024-03-21: qty 3

## 2024-03-21 MED ORDER — ALBUTEROL SULFATE (2.5 MG/3ML) 0.083% IN NEBU: 2.5000 mg | INHALATION_SOLUTION | RESPIRATORY_TRACT | Status: DC | PRN

## 2024-03-21 MED ORDER — SODIUM CHLORIDE 0.9 % IV SOLN: INTRAVENOUS | Status: AC | PRN

## 2024-03-21 MED ORDER — CLOPIDOGREL BISULFATE 75 MG PO TABS
75.0000 mg | ORAL_TABLET | Freq: Every morning | ORAL | Status: DC
Start: 2024-03-22 — End: 2024-03-23
  Administered 2024-03-22 – 2024-03-23 (×2): 75 mg via ORAL
  Filled 2024-03-21 (×2): qty 1

## 2024-03-21 MED ORDER — INSULIN ASPART 100 UNIT/ML IJ SOLN
0.0000 [IU] | Freq: Three times a day (TID) | INTRAMUSCULAR | Status: DC
  Administered 2024-03-22 (×2): 3 [IU] via SUBCUTANEOUS
  Administered 2024-03-22: 7 [IU] via SUBCUTANEOUS

## 2024-03-21 MED ORDER — SODIUM CHLORIDE 0.9 % IV SOLN
2.0000 g | Freq: Once | INTRAVENOUS | Status: AC
  Administered 2024-03-21: 2 g via INTRAVENOUS
  Filled 2024-03-21: qty 20

## 2024-03-21 MED ORDER — BISACODYL 10 MG RE SUPP: 10.0000 mg | Freq: Every day | RECTAL | Status: DC | PRN

## 2024-03-21 MED ORDER — HEPARIN SODIUM (PORCINE) 5000 UNIT/ML IJ SOLN
5000.0000 [IU] | Freq: Three times a day (TID) | INTRAMUSCULAR | Status: DC
  Administered 2024-03-21 – 2024-03-23 (×5): 5000 [IU] via SUBCUTANEOUS
  Filled 2024-03-21 (×5): qty 1

## 2024-03-21 MED ORDER — SODIUM CHLORIDE 0.9% FLUSH
3.0000 mL | INTRAVENOUS | Status: DC | PRN
  Administered 2024-03-21: 3 mL via INTRAVENOUS

## 2024-03-21 MED ORDER — POLYETHYLENE GLYCOL 3350 17 G PO PACK: 17.0000 g | PACK | Freq: Every day | ORAL | Status: DC | PRN

## 2024-03-21 MED ORDER — PREGABALIN 75 MG PO CAPS
75.0000 mg | ORAL_CAPSULE | Freq: Two times a day (BID) | ORAL | Status: DC
  Administered 2024-03-21 – 2024-03-23 (×4): 75 mg via ORAL
  Filled 2024-03-21 (×4): qty 1

## 2024-03-21 MED ORDER — IPRATROPIUM-ALBUTEROL 0.5-2.5 (3) MG/3ML IN SOLN
3.0000 mL | Freq: Two times a day (BID) | RESPIRATORY_TRACT | Status: DC
  Administered 2024-03-22 – 2024-03-23 (×3): 3 mL via RESPIRATORY_TRACT
  Filled 2024-03-21 (×3): qty 3

## 2024-03-21 MED ORDER — ATORVASTATIN CALCIUM 40 MG PO TABS
40.0000 mg | ORAL_TABLET | Freq: Every day | ORAL | Status: DC
  Administered 2024-03-21 – 2024-03-22 (×2): 40 mg via ORAL
  Filled 2024-03-21 (×2): qty 1

## 2024-03-21 MED ORDER — INSULIN ASPART 100 UNIT/ML IJ SOLN
0.0000 [IU] | Freq: Every day | INTRAMUSCULAR | Status: DC
  Administered 2024-03-21: 3 [IU] via SUBCUTANEOUS

## 2024-03-21 NOTE — Plan of Care (Signed)

## 2024-03-21 NOTE — ED Triage Notes (Signed)
 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 Pt's wife is also concerned for PNA due to new cough and hx

## 2024-03-21 NOTE — ED Provider Notes (Signed)
 [place] EMERGENCY DEPARTMENT AT [facility] Provider Note   CSN: [id number] Arrival date & time: 03/21/24  1323     Patient presents with: Flank Pain   [name] is a 78 y.o. male.   Patient complains of cough and weakness.  Patient has a history of coronary artery disease and CABG.  He has been evaluated by oncology for bone lytic lesions with no diagnosis  The history is provided by the patient and medical records. No language interpreter was used.  Flank Pain This is a new problem. The problem has been resolved. Pertinent negatives include no chest pain, no abdominal pain and no headaches. Nothing aggravates the symptoms. Nothing relieves the symptoms. He has tried nothing for the symptoms.       Prior to Admission medications   Medication Sig Start Date End Date Taking? Authorizing Provider  cilostazol  (PLETAL ) 100 MG tablet Take 100 mg by mouth 2 (two) times daily. 02/07/24  Yes [provider]  empagliflozin  (JARDIANCE ) 25 MG TABS tablet Take 12.5 mg by mouth daily. 06/19/23  Yes [provider]  erythromycin ophthalmic ointment Apply 1 Application to eye 3 (three) times daily. 02/18/24  Yes [provider]  famotidine  (PEPCID ) 40 MG tablet Take 40 mg by mouth at bedtime as needed for heartburn or indigestion. 04/02/23  Yes [provider]  Finerenone 10 MG TABS Take 10 mg by mouth daily. 04/24/23  Yes [provider]  losartan -hydrochlorothiazide  (HYZAAR) 100-25 MG tablet Take 1 tablet by mouth daily. for high blood pressure 02/07/24  Yes [provider]  metoprolol  succinate (TOPROL -XL) 50 MG 24 hr tablet Take 25 mg by mouth daily. 02/07/24  Yes [provider]  pregabalin  (LYRICA ) 75 MG capsule Take 75 mg by mouth 2 (two) times daily. 01/22/24  Yes [provider]  Semaglutide,0.25 or 0.5MG /DOS, 2 MG/3ML SOPN Inject 0.5 mg into the skin every 7 (seven) days. 01/23/24  Yes [provider]   albuterol  (VENTOLIN  HFA) 108 (90 Base) MCG/ACT inhaler Inhale 2 puffs into the lungs every 6 (six) hours as needed for wheezing or shortness of breath. 01/03/24   Bryn Bernardino NOVAK, MD  amLODipine  (NORVASC ) 10 MG tablet Take 10 mg by mouth in the morning.    [provider]  aspirin  EC 81 MG tablet Take 1 tablet (81 mg total) by mouth daily with breakfast. 05/23/19   Emokpae, Courage, MD  atorvastatin  (LIPITOR ) 40 MG tablet Take 40 mg by mouth at bedtime. 02/07/24   [provider]  clopidogrel  (PLAVIX ) 75 MG tablet Take 75 mg by mouth in the morning.    [provider]  doxycycline  (VIBRAMYCIN ) 100 MG capsule Take 1 capsule (100 mg total) by mouth 2 (two) times daily. 03/03/24   Stuart Vernell Norris, PA-C  Fluticasone-Umeclidin-Vilant (TRELEGY ELLIPTA) 200-62.5-25 MCG/ACT AEPB Inhale 2 puffs into the lungs daily.    [provider]  gabapentin  (NEURONTIN ) 300 MG capsule Take 1 capsule (300 mg total) by mouth 2 (two) times daily. 01/03/24   Bryn Bernardino NOVAK, MD  insulin  glargine (LANTUS ) 100 UNIT/ML injection Inject 0.1 mLs (10 Units total) into the skin at bedtime. 12/11/23   Johnson, Clanford L, MD  lidocaine  (LIDODERM ) 5 % Place 1 patch onto the skin daily. Remove & Discard patch within 12 hours or as directed by MD 03/03/24   Stuart Vernell Norris, PA-C  losartan  (COZAAR ) 50 MG tablet Take 50 mg by mouth daily. 03/02/22   [provider]  magnesium  oxide (MAG-OX) 400 MG tablet Take 400 mg by mouth daily. 12/04/23   [provider]  metoprolol  tartrate (LOPRESSOR ) 50 MG tablet Take 25 mg by mouth 2 (two) times daily.     [provider]  naproxen (NAPROSYN) 500 MG tablet Take 500 mg by mouth 2 (two) times daily. Patient not taking: Reported on 03/05/2024 01/25/24   [provider]  pantoprazole  (PROTONIX ) 40 MG tablet Take 1 tablet (40 mg total) by mouth 2 (two) times daily. 01/03/24   Bryn Bernardino NOVAK, MD  vitamin B-12 (CYANOCOBALAMIN ) 1000  MCG tablet Take 1,000 mcg by mouth in the morning.    [provider]    Allergies: Baclofen and Acarbose    Review of Systems  Constitutional:  Negative for appetite change and fatigue.  HENT:  Negative for congestion, ear discharge and sinus pressure.   Eyes:  Negative for discharge.  Respiratory:  Negative for cough.   Cardiovascular:  Negative for chest pain.  Gastrointestinal:  Negative for abdominal pain and diarrhea.  Genitourinary:  Positive for flank pain. Negative for frequency and hematuria.  Musculoskeletal:  Negative for back pain.  Skin:  Negative for rash.  Neurological:  Negative for seizures and headaches.  Psychiatric/Behavioral:  Negative for hallucinations.     Updated Vital Signs BP (!) 177/66   Pulse (!) 101   Temp 97.8 F (36.6 C)   Resp 18   Ht 5' 7 (1.702 m)   Wt 58.1 kg   SpO2 96%   BMI 20.05 kg/m   Physical Exam Vitals and nursing note reviewed.  Constitutional:      Appearance: He is well-developed.  HENT:     Head: Normocephalic.     Nose: Nose normal.  Eyes:     General: No scleral icterus.    Conjunctiva/sclera: Conjunctivae normal.  Neck:     Thyroid : No thyromegaly.  Cardiovascular:     Rate and Rhythm: Normal rate and regular rhythm.     Heart sounds: No murmur heard.    No friction rub. No gallop.  Pulmonary:     Breath sounds: No stridor. No wheezing or rales.  Chest:     Chest wall: No tenderness.  Abdominal:     General: There is no distension.     Tenderness: There is no abdominal tenderness. There is no rebound.  Musculoskeletal:        General: Normal range of motion.     Cervical back: Neck supple.  Lymphadenopathy:     Cervical: No cervical adenopathy.  Skin:    Findings: No erythema or rash.  Neurological:     Mental Status: He is alert and oriented to person, place, and time.     Motor: No abnormal muscle tone.     Coordination: Coordination normal.  Psychiatric:        Behavior: Behavior normal.      (all labs ordered are listed, but only abnormal results are displayed) Labs Reviewed  BASIC METABOLIC PANEL WITH GFR - Abnormal; Notable for the following components:      Result Value   Sodium 134 (*)    CO2 20 (*)    Glucose, Bld 341 (*)    BUN 33 (*)    Creatinine, Ser 2.62 (*)    Calcium  8.7 (*)    GFR, Estimated 25 (*)    All other components within normal limits  CBC WITH DIFFERENTIAL/PLATELET - Abnormal; Notable for the following components:   WBC 22.2 (*)  RBC 3.42 (*)    Hemoglobin 9.0 (*)    HCT 28.1 (*)    RDW 19.8 (*)    Platelets 492 (*)    Neutro Abs 19.1 (*)    Monocytes Absolute 1.4 (*)    Abs Immature Granulocytes 0.23 (*)    All other components within normal limits  CULTURE, BLOOD (ROUTINE X 2)  CULTURE, BLOOD (ROUTINE X 2)  URINALYSIS, ROUTINE W REFLEX MICROSCOPIC  LACTIC ACID, PLASMA  LACTIC ACID, PLASMA  HEPATIC FUNCTION PANEL    EKG: None  Radiology: DG Chest 2 View Result Date: 03/21/2024 CLINICAL DATA:  Right rib pain. EXAM: CHEST - 2 VIEW COMPARISON:  March 12, 2024. FINDINGS: Normal cardiomediastinal silhouette. Status post coronary bypass graft. Mildly increased left midlung and basilar opacity is noted concerning for worsening pneumonia or atelectasis. Multiple right rib fractures are noted as described on prior exam. IMPRESSION: Mildly increased left midlung and basilar opacity is noted concerning for worsening pneumonia or atelectasis. Multiple right rib fractures are again noted. Electronically Signed   By: Lynwood Landy Raddle M.D.   On: 03/21/2024 13:57     Procedures   Medications Ordered in the ED  cefTRIAXone  (ROCEPHIN ) 2 g in sodium chloride  0.9 % 100 mL IVPB (2 g Intravenous New Bag/Given 03/21/24 1446)  azithromycin  (ZITHROMAX ) 500 mg in sodium chloride  0.9 % 250 mL IVPB (has no administration in time range)  sodium chloride  0.9 % bolus 1,000 mL (1,000 mLs Intravenous New Bag/Given 03/21/24 1442)  CRITICAL CARE Performed by: Fairy Sermon Total critical care time: 45 minutes Critical care time was exclusive of separately billable procedures and treating other patients. Critical care was necessary to treat or prevent imminent or life-threatening deterioration. Critical care was time spent personally by me on the following activities: development of treatment plan with patient and/or surrogate as well as nursing, discussions with consultants, evaluation of patient's response to treatment, examination of patient, obtaining history from patient or surrogate, ordering and performing treatments and interventions, ordering and review of laboratory studies, ordering and review of radiographic studies, pulse oximetry and re-evaluation of patient's condition.                                   Medical Decision Making Amount and/or Complexity of Data Reviewed Labs: ordered. Radiology: ordered.  Risk Decision regarding hospitalization.   Patient with pneumonia and AKI.  He will be admitted to medicine and hydrated treated with antibiotics     Final diagnoses:  None    ED Discharge Orders     None          Sermon Fairy, MD 03/22/24 1704

## 2024-03-21 NOTE — H&P (Addendum)
 History and Physical   Patient: [name] FMW:[id number] DOB: 31-Oct-1945 DOA: 03/21/2024 DOS: the patient was seen and examined on 03/21/2024 PCP: [facility]  Patient coming from: Home  Chief Complaint:  Chief Complaint  Patient presents with   Flank Pain   HPI: [name] is a 78 y.o. male reformed smoker with medical history significant significant for CKD 3A, CAD status post prior angioplasty and stent placement, status post CABG x 5, HTN,, PVD, status post right and left femoropopliteal bypass, and GERD who who presents to the ED with right-sided rib area discomfort -Patient also endorses productive cough -Additional history obtained from wife at bedside -LFTs are not elevated, bili is normal at 0.6 -Serum albumin  is low at 2.9 --  WBC 22.2 , lactic acid is not --Sodium 134, potassium 3.8 -Creatinine 2.62 it was 1.54 on 01/29/2024 (baseline appears to be around 1.8-1.9) -Chest x-ray concerning for previously noted rib fractures as well as left-sided pneumonia - Review of Systems: As mentioned in the history of present illness. All other systems reviewed and are negative. 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   x 5   Hypercholesteremia    Hypertension    Neuropathy    Peripheral vascular disease (HCC)    s/p R-L FEM-FEM BYPASS   Pneumonia    Tachycardia    after CABG, pt on medicine for this   Past Surgical History:  Procedure Laterality Date   ABDOMINAL AORTOGRAM Bilateral 09/19/2016   Procedure: iliac;  Surgeon: Gaile LELON New, MD;  [facility];  Service: Cardiovascular;  Laterality: Bilateral;   ABDOMINAL  AORTOGRAM W/LOWER EXTREMITY N/A 09/22/2021   Procedure: ABDOMINAL AORTOGRAM W/LOWER EXTREMITY;  Surgeon: Gretta Lonni PARAS, MD;  [facility]: [facility];  Service: Cardiovascular;  Laterality: N/A;   ABDOMINAL AORTOGRAM W/LOWER EXTREMITY N/A 10/05/2022   Procedure: ABDOMINAL AORTOGRAM W/LOWER EXTREMITY;  Surgeon: Gretta Lonni PARAS, MD;  [facility]: [facility];  Service: Cardiovascular;  Laterality: N/A;   BIOPSY  05/16/2023   Procedure: BIOPSY;  Surgeon: Shaaron Lamar HERO, MD;  [facility]: [facility];  Service: Endoscopy;;   CARDIAC CATHETERIZATION  2003   with stent   CARDIAC CATHETERIZATION N/A 10/11/2015   Procedure: Left Heart Cath and Coronary Angiography;  Surgeon: Peter M Swaziland, MD;  Location: [facility] INVASIVE CV LAB;  Service: Cardiovascular;  Laterality: N/A;   COLON RESECTION N/A 12/13/2017   Procedure: EXPLORATORY LAPAROTOMY, SIGMOID COLECTOMY WITH COLOSTOMY;  Surgeon: Belinda Cough, MD;  [facility]: [facility];  Service: General;  Laterality: N/A;   COLONOSCOPY N/A 09/22/2013   Procedure: COLONOSCOPY;  Surgeon: Lamar HERO Shaaron, MD;  [facility];  Service: Endoscopy;  Laterality: N/A;  9:30 AM   COLONOSCOPY N/A 02/19/2018   Procedure: COLONOSCOPY;  Surgeon: Shaaron Lamar HERO, MD;  [facility];  Service: Endoscopy;  Laterality: N/A;  2:00pm   COLONOSCOPY WITH PROPOFOL  N/A 05/16/2023   Procedure: COLONOSCOPY WITH PROPOFOL ;  Surgeon: Shaaron Lamar HERO, MD;  [facility];  Service: Endoscopy;  Laterality: N/A;  730am, asa 3   COLOSTOMY N/A 12/13/2017  Procedure: COLOSTOMY;  Surgeon: Belinda Cough, MD;  Location: [facility] OR;  Service: General;  Laterality: N/A;   COLOSTOMY REVERSAL N/A 03/26/2018   Procedure: COLOSTOMY REVERSAL;  Surgeon: Belinda Cough, MD;  [facility];  Service: General;  Laterality: N/A;   CORONARY ARTERY BYPASS GRAFT N/A 10/18/2015   Procedure: CORONARY ARTERY BYPASS GRAFTING (CABG) x  five, using left internal mammary artery and  right leg greater saphenous vein harvested endoscopically;  Surgeon: Elspeth JAYSON Millers, MD;  Location: [facility];  Service: Open Heart Surgery;  Laterality: N/A;   ENDARTERECTOMY FEMORAL Right 08/09/2016   Procedure: ENDARTERECTOMY FEMORAL WITH VEIN PATCH ANGIOPLASTY;  Surgeon: Gaile LELON New, MD;  [facility]: [facility];  Service: Vascular;  Laterality: Right;   ESOPHAGOGASTRODUODENOSCOPY N/A 10/24/2017   Dr. Shaaron: hiatal hernia   ESOPHAGOGASTRODUODENOSCOPY N/A 01/01/2024   Procedure: EGD (ESOPHAGOGASTRODUODENOSCOPY);  Surgeon: Cinderella Deatrice FALCON, MD;  [facility];  Service: Endoscopy;  Laterality: N/A;   ESOPHAGOGASTRODUODENOSCOPY (EGD) WITH PROPOFOL  N/A 05/16/2023   Procedure: ESOPHAGOGASTRODUODENOSCOPY (EGD) WITH PROPOFOL ;  Surgeon: Shaaron Lamar HERO, MD;  [facility];  Service: Endoscopy;  Laterality: N/A;   FEMORAL-FEMORAL BYPASS GRAFT Bilateral 08/09/2016   Procedure: REVISION BYPASS GRAFT RIGHT FEMORAL-LEFT FEMORAL ARTERY;  Surgeon: Gaile LELON New, MD;  Location: [facility] OR;  Service: Vascular;  Laterality: Bilateral;   FEMORAL-POPLITEAL BYPASS GRAFT     INTRAMEDULLARY (IM) NAIL INTERTROCHANTERIC Left 04/03/2021   Procedure: LEFT  HIP INTRAMEDULLARY (IM) NAIL INTERTROCHANTRIC;  Surgeon: Vernetta Lonni GRADE, MD;  [facility]: [facility];  Service: Orthopedics;  Laterality: Left;   IR FLUORO GUIDED NEEDLE PLC ASPIRATION/INJECTION LOC  12/26/2023   IR IVC FILTER RETRIEVAL / S&I PORTER GUID/MOD SED  01/15/2024   MALONEY DILATION N/A 05/16/2023   Procedure: AGAPITO DILATION;  Surgeon: Shaaron Lamar HERO, MD;  [facility];  Service: Endoscopy;  Laterality: N/A;   PERIPHERAL VASCULAR BALLOON ANGIOPLASTY Right 09/22/2021   Procedure: PERIPHERAL VASCULAR BALLOON ANGIOPLASTY;  Surgeon: Gretta Lonni PARAS, MD;  [facility];  Service: Cardiovascular;  Laterality: Right;   PERIPHERAL VASCULAR CATHETERIZATION N/A 05/08/2016   Procedure: Lower Extremity Angiography;   Surgeon: Dorn PARAS Lesches, MD;  Location: [facility] INVASIVE CV LAB;  Service: Cardiovascular;  Laterality: N/A;   PERIPHERAL VASCULAR INTERVENTION Right 09/19/2016   Procedure: Peripheral Vascular Intervention;  Surgeon: Gaile LELON New, MD;  [facility];  Service: Cardiovascular;  Laterality: Right;  ext iliac stent   PERIPHERAL VASCULAR INTERVENTION Right 09/22/2021   Procedure: PERIPHERAL VASCULAR INTERVENTION;  Surgeon: Gretta Lonni PARAS, MD;  [facility];  Service: Cardiovascular;  Laterality: Right;   POLYPECTOMY  02/19/2018   Procedure: POLYPECTOMY;  Surgeon: Shaaron Lamar HERO, MD;  [facility];  Service: Endoscopy;;   PROCTOSCOPY  10/24/2017   Procedure: PROCTOSCOPY;  Surgeon: Shaaron Lamar HERO, MD;  [facility];  Service: Endoscopy;;   TEE WITHOUT CARDIOVERSION N/A 10/18/2015   Procedure: TRANSESOPHAGEAL ECHOCARDIOGRAM (TEE);  Surgeon: Elspeth JAYSON Millers, MD;  Location: [facility] OR;  Service: Open Heart Surgery;  Laterality: N/A;   Social History:  reports that he quit smoking about 23 years ago. His smoking use included cigarettes. He started smoking about 53 years ago. He has a 60 pack-year smoking history. He has never used smokeless tobacco. He reports that he does not drink alcohol  and does not use drugs.  Allergies  Allergen Reactions   Baclofen Other (See Comments)    Altered mental status, extreme drowsiness  Acarbose Diarrhea    Family History  Problem Relation Age of Onset   Heart attack Mother    Stroke Mother    Colon cancer Brother        late 57s    Prior to Admission medications   Medication Sig Start Date End Date Taking? Authorizing Provider  amLODipine  (NORVASC ) 10 MG tablet Take 10 mg by mouth in the morning.   Yes [provider]  aspirin  EC 81 MG tablet Take 1 tablet (81 mg total) by mouth daily with breakfast. 05/23/19  Yes Latroya Ng, MD  atorvastatin  (LIPITOR ) 40 MG tablet Take 40 mg by  mouth at bedtime. 02/07/24  Yes [provider]  cilostazol  (PLETAL ) 100 MG tablet Take 100 mg by mouth 2 (two) times daily. 02/07/24  Yes [provider]  clopidogrel  (PLAVIX ) 75 MG tablet Take 75 mg by mouth in the morning.   Yes [provider]  insulin  glargine (LANTUS ) 100 UNIT/ML injection Inject 0.1 mLs (10 Units total) into the skin at bedtime. Patient taking differently: Inject 20 Units into the skin at bedtime. 12/11/23  Yes Johnson, Clanford L, MD  magnesium  oxide (MAG-OX) 400 MG tablet Take 400 mg by mouth 2 (two) times daily. 12/04/23  Yes [provider]  metoprolol  succinate (TOPROL -XL) 50 MG 24 hr tablet Take 25 mg by mouth daily. 02/07/24  Yes [provider]  pantoprazole  (PROTONIX ) 40 MG tablet Take 1 tablet (40 mg total) by mouth 2 (two) times daily. Patient taking differently: Take 40 mg by mouth 2 (two) times daily as needed (GERD). 01/03/24  Yes Bryn Bernardino NOVAK, MD  pregabalin  (LYRICA ) 75 MG capsule Take 75 mg by mouth 2 (two) times daily. 01/22/24  Yes [provider]  vitamin B-12 (CYANOCOBALAMIN ) 1000 MCG tablet Take 1,000 mcg by mouth in the morning.   Yes [provider]    Physical Exam: Vitals:   03/21/24 1335 03/21/24 1450 03/21/24 1530 03/21/24 1604  BP: (!) 177/66 (!) 175/66  (!) 145/61  Pulse: (!) 101 94  93  Resp: 18 18  18   Temp: 97.8 F (36.6 C)  98.1 F (36.7 C) 97.9 F (36.6 C)  TempSrc:   Oral Oral  SpO2: 96% 96%  98%  Weight: 58.1 kg   61.1 kg  Height: 5' 7 (1.702 m)       Physical Exam  Gen:- Awake Alert, in no acute distress  HEENT:- .AT, No sclera icterus Neck-Supple Neck,No JVD,.  Lungs-diminished in bases left more than right, few scattered rhonchi CV- S1, S2 normal, RRR, prior CABG Abd-  +ve B.Sounds, Abd Soft, No tenderness,    Extremity/Skin:- No  edema,   good pedal pulses  Psych-affect is appropriate, oriented x3 Neuro-no new focal deficits, no tremors  Data  Reviewed: -LFTs are not elevated, bili is normal at 2.9 --  WBC 22.2 , lactic acid is not --Sodium 134, potassium 3.8 -Creatinine 2.62 it was 1.54 on 01/29/2024 (baseline appears to be around 1.8-1.9) -Chest x-ray concerning for previously noted rib fractures as well as left-sided pneumonia  Assessment and Plan: 1) community-acquired pneumonia------ leukocytosis noted , tachycardia noted, - No fevers, - Productive cough and dyspnea as well as rib cage pain noted - Treat empirically with Rocephin /azithromycin , blood cultures pending - Mucolytics and bronchodilators as ordered  2)CAD--status post prior angioplasty and stent placement, status post CABG x 5 -- No ACS type symptoms -Continue metoprolol  -- Continue aspirin , Plavix  and atorvastatin  for secondary prevention -- 3)PAD--status post  right and left femoropopliteal bypass - Asymptomatic at this time continue aspirin , Plavix , Pletal  and atorvastatin   4)DM2- A1C is 7.6 reflecting uncontrolled DM with hyperglycemia PTA  - Semglee  20 units nightly Use Novolog /Humalog Sliding scale insulin  with Accu-Cheks/Fingersticks as ordered  -- Hold Ozempic, metformin  and Jardiance   5)Mild Hyponatremia due to HCTZ use--hold hydrochlorothiazide   6)AKI---- on CKD 3A ---Creatinine 2.62 it was 1.54 on 01/29/2024 (baseline appears to be around 1.8-1.9) -- renally adjust medications, avoid nephrotoxic agents / dehydration  / hypotension  7)HTN--continue amlodipine , and metoprolol   8)GERD--stable continue Protonix   9) diabetic polyneuropathy--- continue Lyrica   10) recent nontraumatic rib fractures--- supportive and symptomatic treatment  11)Bony Lesions--prior PET scan from May 2025 prior imaging studies suggest--: Lytic bony lesions with areas of multifocal mild areas of abnormal uptake along the skeleton including several ribs, vertebral bodies and scattered along the pelvis. Some of these lesions are associated with areas of bony sclerosis.   -- There is evidence of pathological fractures -Prior T9 bone biopsy from 12/26/2023 and L3 body biopsy from 01/14/2024 without evidence of underlying malignancy --Prior oncology consult noted -Supportive and symptomatic treatment  12) Normocytic anemia: - Combination anemia from CKD and possible functional iron  deficiency. -- Oncology as previously recommended iron  supplementation   Advance Care Planning:   Code Status: Full Code   Family Communication: Discussed with wife at bedside   Severity of Illness: The appropriate patient status for this patient is INPATIENT. Inpatient status is judged to be reasonable and necessary in order to provide the required intensity of service to ensure the patient's safety. The patient's presenting symptoms, physical exam findings, and initial radiographic and laboratory data in the context of their chronic comorbidities is felt to place them at high risk for further clinical deterioration. Furthermore, it is not anticipated that the patient will be medically stable for discharge from the hospital within 2 midnights of admission.   * I certify that at the point of admission it is my clinical judgment that the patient will require inpatient hospital care spanning beyond 2 midnights from the point of admission due to high intensity of service, high risk for further deterioration and high frequency of surveillance required.*  Author: Rendall Carwin, MD 03/21/2024 5:44 PM  For on call review [email or web address].

## 2024-03-21 NOTE — ED Notes (Signed)
 Notified floor patient was going to be coming up.  1st lactic negative so 2nd discontinued.

## 2024-03-21 NOTE — ED Notes (Signed)
 Pt states Aint no need for a EKG, I already know I'm in heart failure.

## 2024-03-22 DIAGNOSIS — J189 Pneumonia, unspecified organism: Secondary | ICD-10-CM | POA: Diagnosis not present

## 2024-03-22 LAB — BASIC METABOLIC PANEL WITH GFR
Anion gap: 9 (ref 5–15)
BUN: 34 mg/dL — ABNORMAL HIGH (ref 8–23)
CO2: 23 mmol/L (ref 22–32)
Calcium: 8.4 mg/dL — ABNORMAL LOW (ref 8.9–10.3)
Chloride: 105 mmol/L (ref 98–111)
Creatinine, Ser: 2.29 mg/dL — ABNORMAL HIGH (ref 0.61–1.24)
GFR, Estimated: 29 mL/min — ABNORMAL LOW (ref >60)
Glucose, Bld: 86 mg/dL (ref 70–99)
Potassium: 3.9 mmol/L (ref 3.5–5.1)
Sodium: 137 mmol/L (ref 135–145)

## 2024-03-22 LAB — URINALYSIS, ROUTINE W REFLEX MICROSCOPIC
Bacteria, UA: NONE SEEN
Bilirubin Urine: NEGATIVE
Glucose, UA: >=500 mg/dL — AB
Hgb urine dipstick: NEGATIVE
Ketones, ur: NEGATIVE mg/dL
Leukocytes,Ua: NEGATIVE
Nitrite: NEGATIVE
Protein, ur: NEGATIVE mg/dL
Specific Gravity, Urine: 1.014 (ref 1.005–1.030)
pH: 6 (ref 5.0–8.0)

## 2024-03-22 LAB — GLUCOSE, CAPILLARY
Glucose-Capillary: 212 mg/dL — ABNORMAL HIGH (ref 70–99)
Glucose-Capillary: 141 mg/dL — ABNORMAL HIGH (ref 70–99)
Glucose-Capillary: 141 mg/dL — ABNORMAL HIGH (ref 70–99)
Glucose-Capillary: 159 mg/dL — ABNORMAL HIGH (ref 70–99)

## 2024-03-22 LAB — CBC
HCT: 24.4 % — ABNORMAL LOW (ref 39.0–52.0)
Hemoglobin: 7.6 g/dL — ABNORMAL LOW (ref 13.0–17.0)
MCH: 25.8 pg — ABNORMAL LOW (ref 26.0–34.0)
MCHC: 31.1 g/dL (ref 30.0–36.0)
MCV: 82.7 fL (ref 80.0–100.0)
Platelets: 422 K/uL — ABNORMAL HIGH (ref 150–400)
RBC: 2.95 MIL/uL — ABNORMAL LOW (ref 4.22–5.81)
RDW: 19.9 % — ABNORMAL HIGH (ref 11.5–15.5)
WBC: 14 K/uL — ABNORMAL HIGH (ref 4.0–10.5)
nRBC: 0 % (ref 0.0–0.2)

## 2024-03-22 NOTE — Progress Notes (Signed)
 PROGRESS NOTE  [name], DOB - Nov 22, 1945, FMW:[id number]  Admit date - 03/21/2024   Admitting Physician Anthony Wiltrout Pearlean, MD  Outpatient Primary MD for the patient is Clinic, [name]  LOS - 1  Chief Complaint  Patient presents with   Flank Pain      Brief Narrative:  78 y.o. Dunn reformed smoker with medical history significant significant for CKD 3A, CAD status post prior angioplasty and stent placement, status post CABG x 5, HTN,, PVD, status post right and left femoropopliteal bypass, and GERD who who was admitted with community-acquired pneumonia and right-sided rib area discomfort     -Assessment and Plan: 1) community-acquired pneumonia------  POA - Productive cough and dyspnea as well as rib cage pain persist -WBC 22.2 >>14.0 - C/n Rocephin /azithromycin , blood cultures pending - Mucolytics and bronchodilators as ordered --Ambulatory mobility specialist today --Per patient and wife not quite back to baseline anticipate possible discharge over the next 24 hours if continues to improve   2)CAD--status post prior angioplasty and stent placement, status post CABG x 5 -- No ACS type symptoms -Continue metoprolol  -- Continue aspirin , Plavix  and atorvastatin  for secondary prevention -- 3)PAD--status post right and left femoropopliteal bypass - Asymptomatic at this time continue aspirin , Plavix , Pletal  and atorvastatin    4)DM2- A1C is 7.6 reflecting uncontrolled DM with hyperglycemia PTA  - Semglee  20 units nightly Use Novolog /Humalog Sliding scale insulin  with Accu-Cheks/Fingersticks as ordered  -- Hold Ozempic, metformin  and Jardiance    5)Mild Hyponatremia due to HCTZ use--hold hydrochlorothiazide  -Sodium has normalized   6)AKI---- on CKD 3A Creatinine 2.62 >>2.29 (creatinine was 1.54 on 01/29/2024 and 1.85 on 01/28/2024) -Patient is voiding well despite holding HCTZ --Possible discharge home in the next 24 to 48 hours if renal function continues  to improve -- renally adjust medications, avoid nephrotoxic agents / dehydration  / hypotension   7)HTN--continue amlodipine , and metoprolol    8)GERD--stable continue Protonix    9) diabetic polyneuropathy--- continue Lyrica    10) recent nontraumatic rib fractures--- supportive and symptomatic treatment   11)Bony Lesions--prior PET scan from May 2025 prior imaging studies suggest--: Lytic bony lesions with areas of multifocal mild areas of abnormal uptake along the skeleton including several ribs, vertebral bodies and scattered along the pelvis. Some of these lesions are associated with areas of bony sclerosis.  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 -- Oncology as previously recommended iron  supplementation - Hgb dropped to 7.6 from 9.0 on admission due to hemodilution  Status is: Inpatient  =-  Disposition: The patient is from: Home              Anticipated d/c is to: Home              Anticipated d/c date is: 1 day              Patient currently is not medically stable to d/c. Barriers: Not Clinically Stable-   Code Status :  -  Code Status: Full Code   Family Communication:    (patient is alert, awake and coherent)   Discussed with patient wife at bedside DVT Prophylaxis  :   - SCDs  heparin  injection 5,000 Units Start: 03/21/24 2200 SCDs Start: 03/21/24 1741 Place TED  hose Start: 03/21/24 1741   Lab Results  Component Value Date   PLT 422 (H) 03/22/2024    Inpatient Medications  Scheduled Meds:  amLODipine   10 mg Oral q AM   aspirin  EC  81 mg Oral Q breakfast   atorvastatin   40 mg Oral QHS   cilostazol   100 mg Oral BID   clopidogrel   75 mg Oral q AM    cyanocobalamin   1,000 mcg Oral q AM   dextromethorphan -guaiFENesin   1 tablet Oral BID   heparin   5,000 Units Subcutaneous Q8H   insulin  aspart  0-20 Units Subcutaneous TID WC   insulin  aspart  0-5 Units Subcutaneous QHS   insulin  aspart  3 Units Subcutaneous TID WC   insulin  glargine-yfgn  20 Units Subcutaneous QHS   ipratropium-albuterol   3 mL Nebulization BID   magnesium  oxide  400 mg Oral BID   metoprolol  succinate  25 mg Oral Daily   pantoprazole   40 mg Oral BID   pregabalin   75 mg Oral BID   sodium chloride  flush  3 mL Intravenous Q12H   sodium chloride  flush  3 mL Intravenous Q12H   Continuous Infusions:  sodium chloride      azithromycin  250 mL/hr at 03/22/24 0324   cefTRIAXone  (ROCEPHIN )  IV Stopped (03/21/24 1824)   PRN Meds:.sodium chloride , acetaminophen  **OR** acetaminophen , albuterol , bisacodyl , ondansetron  **OR** ondansetron  (ZOFRAN ) IV, polyethylene glycol, sodium chloride  flush, traZODone    Anti-infectives (From admission, onward)    Start     Dose/Rate Route Frequency Ordered Stop   03/21/24 1830  azithromycin  (ZITHROMAX ) 500 mg in sodium chloride  0.9 % 250 mL IVPB        500 mg 250 mL/hr over 60 Minutes Intravenous Every 24 hours 03/21/24 1741     03/21/24 1830  cefTRIAXone  (ROCEPHIN ) 2 g in sodium chloride  0.9 % 100 mL IVPB        2 g 200 mL/hr over 30 Minutes Intravenous Every 24 hours 03/21/24 1741     03/21/24 1445  cefTRIAXone  (ROCEPHIN ) 2 g in sodium chloride  0.9 % 100 mL IVPB        2 g 200 mL/hr over 30 Minutes Intravenous  Once 03/21/24 1439 03/21/24 1521   03/21/24 1445  azithromycin  (ZITHROMAX ) 500 mg in sodium chloride  0.9 % 250 mL IVPB        500 mg 250 mL/hr over 60 Minutes Intravenous  Once 03/21/24 1439 03/21/24 1555      Subjective: [name] today has no fevers, no emesis,  No chest pain,   - Cough and dyspnea persist - Wife at bedside questions answered - Ambulated with mobility specialist today  Objective: Vitals:   03/21/24 2349  03/22/24 0504 03/22/24 0736 03/22/24 1223  BP: (!) 153/72 (!) 153/65  (!) 143/62  Pulse: 91 (!) 102  91  Resp: 18 20  18   Temp: 98.1 F (36.7 C) 98.1 F (36.7 C)  98.1 F (36.7 C)  TempSrc: Oral Oral  Oral  SpO2: 93% 91% 95% 94%  Weight:      Height:        Intake/Output Summary (Last 24 hours) at 03/22/2024 1714 Last data filed at 03/22/2024 1500 Gross per 24 hour  Intake 350.07 ml  Output --  Net 350.07 ml   Filed Weights   03/21/24 1335 03/21/24 1604  Weight: 58.1 kg 61.1 kg    Physical Exam  Gen:- Awake Alert, no conversational dyspnea HEENT:- [place].AT, No sclera icterus Neck-Supple Neck,No JVD,.  Lungs-somewhat improving air movement,  no wheezing , few scattered rhonchi CV- S1, S2 normal, regular  Abd-  +ve B.Sounds, Abd Soft, No tenderness,    Extremity/Skin:- No  edema, pedal pulses present  Psych-affect is appropriate, oriented x3 Neuro-no new focal deficits, no tremors  Data Reviewed: I have personally reviewed following labs and imaging studies  CBC: Recent Labs  Lab 03/21/24 1354 03/22/24 0429  WBC 22.2* 14.0*  NEUTROABS 19.1*  --   HGB 9.0* 7.6*  HCT 28.1* 24.4*  MCV 82.2 82.7  PLT 492* 422*   Basic Metabolic Panel: Recent Labs  Lab 03/21/24 1354 03/22/24 0429  NA 134* 137  K 3.8 3.9  CL 99 105  CO2 20* 23  GLUCOSE 341* 86  BUN 33* 34*  CREATININE 2.62* 2.29*  CALCIUM  8.7* 8.4*   GFR: Estimated Creatinine Clearance: 23.7 mL/min (A) (by C-G formula based on SCr of 2.29 mg/dL (H)). Liver Function Tests: Recent Labs  Lab 03/21/24 1451  AST 17  ALT 16  ALKPHOS 115  BILITOT 0.6  PROT 7.3  ALBUMIN  2.9*   Recent Results (from the past 240 hours)  Blood culture (routine x 2)     Status: None (Preliminary result)   Collection Time: 03/21/24  2:51 PM   Specimen: Right Antecubital; Blood  Result Value Ref Range Status   Specimen Description   Final    RIGHT ANTECUBITAL BOTTLES DRAWN AEROBIC AND ANAEROBIC   Special Requests Blood Culture  adequate volume  Final   Culture   Final    NO GROWTH < 24 HOURS Performed at [facility], [address] [place], [place] [address]    Report Status PENDING  Incomplete  Blood culture (routine x 2)     Status: None (Preliminary result)   Collection Time: 03/21/24  2:51 PM   Specimen: Left Antecubital; Blood  Result Value Ref Range Status   Specimen Description   Final    LEFT ANTECUBITAL BOTTLES DRAWN AEROBIC AND ANAEROBIC   Special Requests Blood Culture adequate volume  Final   Culture   Final    NO GROWTH < 24 HOURS Performed at [facility], [address], [place], [place] [address]    Report Status PENDING  Incomplete    Radiology Studies: DG Chest 2 View Result Date: 03/21/2024 CLINICAL DATA:  Right rib pain. EXAM: CHEST - 2 VIEW COMPARISON:  March 12, 2024. FINDINGS: Normal cardiomediastinal silhouette. Status post coronary bypass graft. Mildly increased left midlung and basilar opacity is noted concerning for worsening pneumonia or atelectasis. Multiple right rib fractures are noted as described on prior exam. IMPRESSION: Mildly increased left midlung and basilar opacity is noted concerning for worsening pneumonia or atelectasis. Multiple right rib fractures are again noted. Electronically Signed   By: Lynwood Landy Raddle M.D.   On: 03/21/2024 13:57   Scheduled Meds:  amLODipine   10 mg Oral q AM   aspirin  EC  81 mg Oral Q breakfast   atorvastatin   40 mg Oral QHS   cilostazol   100 mg Oral BID   clopidogrel   75 mg Oral q AM   cyanocobalamin   1,000 mcg Oral q AM   dextromethorphan -guaiFENesin   1 tablet Oral BID   heparin   5,000 Units Subcutaneous Q8H   insulin  aspart  0-20 Units Subcutaneous TID WC   insulin  aspart  0-5 Units Subcutaneous QHS   insulin  aspart  3 Units Subcutaneous TID WC   insulin  glargine-yfgn  20 Units Subcutaneous QHS   ipratropium-albuterol   3 mL Nebulization BID  magnesium  oxide  400 mg Oral BID   metoprolol  succinate  25 mg Oral Daily   pantoprazole   40  mg Oral BID   pregabalin   75 mg Oral BID   sodium chloride  flush  3 mL Intravenous Q12H   sodium chloride  flush  3 mL Intravenous Q12H   Continuous Infusions:  sodium chloride      azithromycin  250 mL/hr at 03/22/24 0324   cefTRIAXone  (ROCEPHIN )  IV Stopped (03/21/24 1824)    LOS: 1 day   Rendall Carwin M.D on 03/22/2024 at 5:14 PM  Go to [email or web address] - for contact info  [facility] - Office  [phone]  If 7PM-7AM, please contact night-coverage [email or web address] 03/22/2024, 5:14 PM

## 2024-03-22 NOTE — Plan of Care (Signed)

## 2024-03-22 NOTE — Progress Notes (Signed)
 Mobility Specialist Progress Note:    03/22/24 1048  Mobility  Activity Ambulated with assistance  Level of Assistance Modified independent, requires aide device or extra time  Assistive Device None  Distance Ambulated (ft) 400 ft  Range of Motion/Exercises Active;All extremities  Activity Response Tolerated well  Mobility Referral Yes  Mobility visit 1 Mobility  Mobility Specialist Start Time (ACUTE ONLY) 1048  Mobility Specialist Stop Time (ACUTE ONLY) 1108  Mobility Specialist Time Calculation (min) (ACUTE ONLY) 20 min   Pt received in bed, family in room. Agreeable to mobility, ModI to stand and ambulate with no AD. Tolerated well, SpO2 96% on RA at rest. SpO2 94-96% on RA during ambulation, returned supine. All needs met.   Arnie Maiolo Mobility Specialist Please contact via Special educational needs teacher or  Rehab office at [phone]

## 2024-03-22 NOTE — TOC Initial Note (Signed)
 Transition of Care [facility]) - Initial/Assessment Note    Patient Details  Name: [name] MRN: [id number] Date of Birth: Jan 07, 1946  [facility] CM/SW Contact:    Nena LITTIE Coffee, RN Phone Number: 03/22/2024, 4:23 PM  Clinical Narrative:                 Pt admitted c/pneumonia, from home c/wife, independent. Plans to return home at dc.   [facility] notification ID is:  [id number]  Expected Discharge Plan: Home/Self Care Barriers to Discharge: Continued Medical Work up   Patient Goals and CMS Choice Patient states their goals for this hospitalization and ongoing recovery are:: Return home          Expected Discharge Plan and Services In-house Referral: Clinical Social Work Discharge Planning Services: CM Consult   Living arrangements for the past 2 months: Single Family Home                                      Prior Living Arrangements/Services Living arrangements for the past 2 months: Single Family Home Lives with:: Spouse Patient language and need for interpreter reviewed:: Yes Do you feel safe going back to the place where you live?: Yes      Need for Family Participation in Patient Care: Yes (Comment) Care giver support system in place?: Yes (comment)   Criminal Activity/Legal Involvement Pertinent to Current Situation/Hospitalization: No - Comment as needed  Activities of Daily Living   ADL Screening (condition at time of admission) Independently performs ADLs?: Yes (appropriate for developmental age) Is the patient deaf or have difficulty hearing?: No Does the patient have difficulty seeing, even when wearing glasses/contacts?: No Does the patient have difficulty concentrating, remembering, or making decisions?: No  Permission Sought/Granted                  Emotional Assessment Appearance:: Appears stated age Attitude/Demeanor/Rapport: Engaged Affect (typically observed): Appropriate Orientation: : Oriented to Self,  Oriented to Place, Oriented to  Time, Oriented to Situation Alcohol  / Substance Use: Not Applicable Psych Involvement: No (comment)  Admission diagnosis:  Pneumonia, organism unspecified(486) [J18.9] Patient Active Problem List   Diagnosis Date Noted   Pneumonia, organism unspecified(486) 03/21/2024   Acute encephalopathy 01/26/2024   Iron  deficiency anemia 01/23/2024   Chronic kidney disease (CKD), stage IV (severe) (HCC) 01/13/2024   Coffee ground emesis 01/01/2024   Acute esophagitis 01/01/2024   Acute respiratory failure with hypoxia (HCC) 01/01/2024   Acute renal failure superimposed on stage 4 chronic kidney disease (HCC) 12/29/2023   Metastatic cancer to bone (HCC) 12/18/2023   Syncope and collapse 12/10/2023   Hypocalcemia 12/10/2023   Anemia 12/10/2023   Lytic bone lesions on xray 12/08/2023   Multifocal pneumonia 12/07/2023   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   H/O adenomatous polyp of colon 11/22/2022   Aortic stenosis 05/30/2022   Coronary artery disease involving coronary bypass graft of native heart without angina pectoris 11/15/2021   Pain and swelling of right elbow 04/23/2021   Fall    Closed nondisplaced intertrochanteric fracture of left femur (HCC)    Closed left hip fracture, initial encounter (HCC) 04/01/2021   CAP (community acquired pneumonia) 05/21/2019   Diarrhea 03/18/2019   Loss of weight 03/18/2019   Klebsiella sepsis (HCC) 09/15/2018   Urinary tract infection without hematuria    Acute lower UTI 09/13/2018   ABLA (acute blood loss anemia)  03/27/2018   S/P colostomy takedown 03/26/2018   Hypertension 12/12/2017   Type 2 diabetes mellitus with diabetic neuropathy (HCC) 12/12/2017   CKD (chronic kidney disease), stage III (HCC) 12/12/2017   Hypercholesteremia 12/12/2017   Hypokalemia 12/12/2017   GI bleed 12/06/2017   Rectal bleed 12/06/2017   Uncontrolled type 2 diabetes mellitus with hyperglycemia, with long-term current use of  insulin  (HCC) 12/06/2017   Acute renal failure superimposed on stage 3 chronic kidney disease (HCC) 12/06/2017   Malnutrition of moderate degree 12/06/2017   Abnormal CT scan, colon 11/23/2017   Constipation 11/23/2017   Pneumonia of left upper lobe due to infectious organism 10/02/2017   Diverticulitis large intestine 08/19/2017   Diverticulitis of large intestine with perforation and abscess without bleeding    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 08/16/2017   CKD (chronic kidney disease) stage 3, GFR 30-59 ml/min (HCC) 08/02/2017   Sepsis due to undetermined organism (HCC) 08/01/2017   GERD (gastroesophageal reflux disease) 08/01/2017   PAD (peripheral artery disease) (HCC) 08/09/2016   Claudication (HCC) 05/08/2016   HCAP (healthcare-associated pneumonia) 01/12/2016   Hyponatremia 01/12/2016   Abnormal chest CT 10/13/2015   Hx of CABG 10/13/2015   Type 2 diabetes, HbA1c goal < 7% (HCC)    Unstable angina (HCC)    Hypomagnesemia    Type 2 diabetes mellitus (HCC) 10/09/2015   ASCVD (arteriosclerotic cardiovascular disease) 10/09/2015   CKD (chronic kidney disease) stage 2, GFR 60-89 ml/min 10/09/2015   Sepsis (HCC) 07/09/2015   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 07/09/2015   PVD (peripheral vascular disease) (HCC) 07/09/2015   Essential hypertension 07/09/2015   Orthostatic hypotension 07/09/2015   Neuropathy, peripheral 07/09/2015   HLD (hyperlipidemia) 07/09/2015   AKI (acute kidney injury) (HCC) 07/09/2015   CAD S/P percutaneous coronary angioplasty 08/14/1996   PCP:  Clinic, [facility] Pharmacy:   [facility] [address], [place] - [address] [place] [address] Phone: [phone] Fax: [phone]  [facility] - [place], [place] - [address] [place] [address] Phone: [phone] Fax: [phone]  [facility] [address] [place] [address] Phone: [phone]  Fax: [phone]     Social Drivers of Health (SDOH) Social History: SDOH Screenings   Food Insecurity: No Food Insecurity (03/21/2024)  Housing: Low Risk  (03/21/2024)  Transportation Needs: No Transportation Needs (03/21/2024)  Utilities: Not At Risk (03/21/2024)  Depression (PHQ2-9): Low Risk  (03/05/2024)  Financial Resource Strain: Low Risk  (10/08/2023)   Received from [facility]  Social Connections: Socially Integrated (03/21/2024)  Tobacco Use: Medium Risk (03/21/2024)  Health Literacy: High Risk (10/08/2023)   [facility] from [facility]   SDOH Interventions:     Readmission Risk Interventions    03/22/2024    4:21 PM 01/29/2024   11:17 AM 01/14/2024    4:29 PM  Readmission Risk Prevention Plan  Transportation Screening Complete Complete Complete  PCP or Specialist Appt within 3-5 Days  Complete Complete  HRI or Home Care Consult  Complete Complete  Social Work Consult for Recovery Care Planning/Counseling   Complete  Palliative Care Screening  Not Applicable Not Applicable  Medication Review Oceanographer) Complete Complete Complete  PCP or Specialist appointment within 3-5 days of discharge Complete    HRI or Home Care Consult Complete    SW Recovery Care/Counseling Consult Complete    Palliative Care Screening Not Applicable    Skilled  Nursing Facility Not Applicable

## 2024-03-22 NOTE — Plan of Care (Signed)
   Problem: Education: Goal: Knowledge of General Education information will improve Description Including pain rating scale, medication(s)/side effects and non-pharmacologic comfort measures Outcome: Progressing   Problem: Health Behavior/Discharge Planning: Goal: Ability to manage health-related needs will improve Outcome: Progressing

## 2024-03-23 DIAGNOSIS — J189 Pneumonia, unspecified organism: Secondary | ICD-10-CM | POA: Diagnosis not present

## 2024-03-23 LAB — CBC
HCT: 24.2 % — ABNORMAL LOW (ref 39.0–52.0)
Hemoglobin: 7.7 g/dL — ABNORMAL LOW (ref 13.0–17.0)
MCH: 25.8 pg — ABNORMAL LOW (ref 26.0–34.0)
MCHC: 31.8 g/dL (ref 30.0–36.0)
MCV: 80.9 fL (ref 80.0–100.0)
Platelets: 412 K/uL — ABNORMAL HIGH (ref 150–400)
RBC: 2.99 MIL/uL — ABNORMAL LOW (ref 4.22–5.81)
RDW: 19.5 % — ABNORMAL HIGH (ref 11.5–15.5)
WBC: 11.9 K/uL — ABNORMAL HIGH (ref 4.0–10.5)
nRBC: 0 % (ref 0.0–0.2)

## 2024-03-23 LAB — BASIC METABOLIC PANEL WITH GFR
Anion gap: 10 (ref 5–15)
BUN: 31 mg/dL — ABNORMAL HIGH (ref 8–23)
CO2: 22 mmol/L (ref 22–32)
Calcium: 8.4 mg/dL — ABNORMAL LOW (ref 8.9–10.3)
Chloride: 101 mmol/L (ref 98–111)
Creatinine, Ser: 2.11 mg/dL — ABNORMAL HIGH (ref 0.61–1.24)
GFR, Estimated: 32 mL/min — ABNORMAL LOW (ref >60)
Glucose, Bld: 75 mg/dL (ref 70–99)
Potassium: 3.6 mmol/L (ref 3.5–5.1)
Sodium: 133 mmol/L — ABNORMAL LOW (ref 135–145)

## 2024-03-23 LAB — GLUCOSE, CAPILLARY: Glucose-Capillary: 76 mg/dL (ref 70–99)

## 2024-03-23 MED ORDER — CEFDINIR 300 MG PO CAPS: 300.0000 mg | ORAL_CAPSULE | Freq: Two times a day (BID) | ORAL | 0 refills | Status: AC

## 2024-03-23 MED ORDER — GUAIFENESIN ER 600 MG PO TB12
600.0000 mg | ORAL_TABLET | Freq: Two times a day (BID) | ORAL | 2 refills | Status: DC
Start: 2024-03-23 — End: 2024-05-13

## 2024-03-23 MED ORDER — PANTOPRAZOLE SODIUM 40 MG PO TBEC: 40.0000 mg | DELAYED_RELEASE_TABLET | Freq: Every day | ORAL | 5 refills | Status: DC

## 2024-03-23 MED ORDER — METOPROLOL SUCCINATE ER 50 MG PO TB24: 25.0000 mg | ORAL_TABLET | Freq: Every day | ORAL | 5 refills | Status: DC

## 2024-03-23 MED ORDER — PREDNISONE 20 MG PO TABS: 40.0000 mg | ORAL_TABLET | Freq: Every day | ORAL | 0 refills | Status: AC

## 2024-03-23 MED ORDER — AZITHROMYCIN 500 MG PO TABS: 500.0000 mg | ORAL_TABLET | Freq: Every day | ORAL | 0 refills | Status: AC

## 2024-03-23 NOTE — Discharge Instructions (Signed)
 1)Avoid ibuprofen /Advil /Aleve/Motrin Josefine Powders/Naproxen/BC powders/Meloxicam/Diclofenac/Indomethacin and other Nonsteroidal anti-inflammatory medications as these will make you more likely to bleed and can cause stomach ulcers, can also cause Kidney problems.   2)Repeat BMP and CBC Blood Tests in 1 week advised  3)Follow up with primary care physician 1 to 2 weeks for recheck

## 2024-03-23 NOTE — Discharge Summary (Signed)
 [name], is a 78 y.o. male  DOB Oct 18, 1945  MRN [id number].  Admission date:  03/21/2024  Admitting Physician  Rendall Carwin, MD  Discharge Date:  03/23/2024   Primary MD  [facility]  Recommendations for primary care physician for things to follow:  1)Avoid ibuprofen /Advil /Aleve/Motrin Josefine Powders/Naproxen/BC powders/Meloxicam/Diclofenac/Indomethacin and other Nonsteroidal anti-inflammatory medications as these will make you more likely to bleed and can cause stomach ulcers, can also cause Kidney problems.   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   PVD (peripheral vascular disease) (HCC)   GERD (gastroesophageal reflux disease)   CKD (chronic kidney disease) stage 3, GFR 30-59 ml/min (HCC)   Acute renal failure superimposed on stage 3 chronic kidney disease (HCC)   Type 2 diabetes mellitus with diabetic neuropathy (HCC)   CAD S/P percutaneous coronary angioplasty      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   x 5   Hypercholesteremia    Hypertension    Neuropathy    Peripheral vascular disease (HCC)    s/p R-L FEM-FEM BYPASS   Pneumonia    Tachycardia    after CABG, pt on medicine for this    Past Surgical History:   Procedure Laterality Date   ABDOMINAL AORTOGRAM Bilateral 09/19/2016   Procedure: iliac;  Surgeon: Gaile LELON New, MD;  [facility]: [facility];  Service: Cardiovascular;  Laterality: Bilateral;   ABDOMINAL AORTOGRAM W/LOWER EXTREMITY N/A 09/22/2021   Procedure: ABDOMINAL AORTOGRAM W/LOWER EXTREMITY;  Surgeon: Gretta Lonni PARAS, MD;  [facility]: [facility];  Service: Cardiovascular;  Laterality: N/A;   ABDOMINAL AORTOGRAM W/LOWER EXTREMITY N/A 10/05/2022   Procedure: ABDOMINAL AORTOGRAM W/LOWER EXTREMITY;  Surgeon: Gretta Lonni PARAS, MD;  [facility]: [facility];  Service: Cardiovascular;  Laterality: N/A;   BIOPSY  05/16/2023   Procedure: BIOPSY;  Surgeon: Shaaron Lamar HERO, MD;  [facility];  Service: Endoscopy;;   CARDIAC CATHETERIZATION  2003   with stent   CARDIAC CATHETERIZATION N/A 10/11/2015   Procedure: Left Heart Cath and Coronary Angiography;  Surgeon: Peter M Swaziland, MD;  Location: [facility] CV LAB;  Service: Cardiovascular;  Laterality: N/A;   COLON RESECTION N/A 12/13/2017   Procedure: EXPLORATORY LAPAROTOMY, SIGMOID COLECTOMY WITH COLOSTOMY;  Surgeon: Belinda Cough, MD;  [facility]: [facility];  Service: General;  Laterality: N/A;   COLONOSCOPY  N/A 09/22/2013   Procedure: COLONOSCOPY;  Surgeon: Lamar CHRISTELLA Hollingshead, MD;  [facility];  Service: Endoscopy;  Laterality: N/A;  9:30 AM   COLONOSCOPY N/A 02/19/2018   Procedure: COLONOSCOPY;  Surgeon: Hollingshead Lamar CHRISTELLA, MD;  [facility];  Service: Endoscopy;  Laterality: N/A;  2:00pm   COLONOSCOPY WITH PROPOFOL  N/A 05/16/2023   Procedure: COLONOSCOPY WITH PROPOFOL ;  Surgeon: Hollingshead Lamar CHRISTELLA, MD;  [facility];  Service: Endoscopy;  Laterality: N/A;  730am, asa 3   COLOSTOMY N/A 12/13/2017   Procedure: COLOSTOMY;  Surgeon: Belinda Cough, MD;  Location: [facility] OR;  Service: General;  Laterality: N/A;   COLOSTOMY REVERSAL N/A 03/26/2018   Procedure: COLOSTOMY REVERSAL;  Surgeon:  Belinda Cough, MD;  [facility];  Service: General;  Laterality: N/A;   CORONARY ARTERY BYPASS GRAFT N/A 10/18/2015   Procedure: CORONARY ARTERY BYPASS GRAFTING (CABG) x  five, using left internal mammary artery and right leg greater saphenous vein harvested endoscopically;  Surgeon: Elspeth JAYSON Millers, MD;  Location: [facility];  Service: Open Heart Surgery;  Laterality: N/A;   ENDARTERECTOMY FEMORAL Right 08/09/2016   Procedure: ENDARTERECTOMY FEMORAL WITH VEIN PATCH ANGIOPLASTY;  Surgeon: Gaile LELON New, MD;  [facility];  Service: Vascular;  Laterality: Right;   ESOPHAGOGASTRODUODENOSCOPY N/A 10/24/2017   Dr. Hollingshead: hiatal hernia   ESOPHAGOGASTRODUODENOSCOPY N/A 01/01/2024   Procedure: EGD (ESOPHAGOGASTRODUODENOSCOPY);  Surgeon: Cinderella Deatrice FALCON, MD;  [facility];  Service: Endoscopy;  Laterality: N/A;   ESOPHAGOGASTRODUODENOSCOPY (EGD) WITH PROPOFOL  N/A 05/16/2023   Procedure: ESOPHAGOGASTRODUODENOSCOPY (EGD) WITH PROPOFOL ;  Surgeon: Hollingshead Lamar CHRISTELLA, MD;  [facility];  Service: Endoscopy;  Laterality: N/A;   FEMORAL-FEMORAL BYPASS GRAFT Bilateral 08/09/2016   Procedure: REVISION BYPASS GRAFT RIGHT FEMORAL-LEFT FEMORAL ARTERY;  Surgeon: Gaile LELON New, MD;  Location: [facility] OR;  Service: Vascular;  Laterality: Bilateral;   FEMORAL-POPLITEAL BYPASS GRAFT     INTRAMEDULLARY (IM) NAIL INTERTROCHANTERIC Left 04/03/2021   Procedure: LEFT  HIP INTRAMEDULLARY (IM) NAIL INTERTROCHANTRIC;  Surgeon: Vernetta Lonni GRADE, MD;  [facility];  Service: Orthopedics;  Laterality: Left;   IR FLUORO GUIDED NEEDLE PLC ASPIRATION/INJECTION LOC  12/26/2023   IR IVC FILTER RETRIEVAL / S&I PORTER GUID/MOD SED  01/15/2024   MALONEY DILATION N/A 05/16/2023   Procedure: AGAPITO DILATION;  Surgeon: Hollingshead Lamar CHRISTELLA, MD;  [facility];  Service: Endoscopy;  Laterality: N/A;   PERIPHERAL VASCULAR BALLOON ANGIOPLASTY Right 09/22/2021   Procedure: PERIPHERAL VASCULAR BALLOON  ANGIOPLASTY;  Surgeon: Gretta Lonni PARAS, MD;  [facility];  Service: Cardiovascular;  Laterality: Right;   PERIPHERAL VASCULAR CATHETERIZATION N/A 05/08/2016   Procedure: Lower Extremity Angiography;  Surgeon: Dorn PARAS Lesches, MD;  Location: [facility] INVASIVE CV LAB;  Service: Cardiovascular;  Laterality: N/A;   PERIPHERAL VASCULAR INTERVENTION Right 09/19/2016   Procedure: Peripheral Vascular Intervention;  Surgeon: Gaile LELON New, MD;  [facility];  Service: Cardiovascular;  Laterality: Right;  ext iliac stent   PERIPHERAL VASCULAR INTERVENTION Right 09/22/2021   Procedure: PERIPHERAL VASCULAR INTERVENTION;  Surgeon: Gretta Lonni PARAS, MD;  [facility];  Service: Cardiovascular;  Laterality: Right;   POLYPECTOMY  02/19/2018   Procedure: POLYPECTOMY;  Surgeon: Hollingshead Lamar CHRISTELLA, MD;  [facility];  Service: Endoscopy;;   PROCTOSCOPY  10/24/2017   Procedure: PROCTOSCOPY;  Surgeon: Hollingshead Lamar CHRISTELLA, MD;  [facility];  Service: Endoscopy;;   TEE WITHOUT CARDIOVERSION N/A 10/18/2015  Procedure: TRANSESOPHAGEAL ECHOCARDIOGRAM (TEE);  Surgeon: Elspeth JAYSON Millers, MD;  Location: [facility] OR;  Service: Open Heart Surgery;  Laterality: N/A;    HPI  from the history and physical done on the day of admission:   HPI: KAMEL HAVEN is a 78 y.o. male reformed smoker with medical history significant significant for CKD 3A, CAD status post prior angioplasty and stent placement, status post CABG x 5, HTN,, PVD, status post right and left femoropopliteal bypass, and GERD who who presents to the ED with right-sided rib area discomfort -Patient also endorses productive cough -Additional history obtained from wife at bedside -LFTs are not elevated, bili is normal at 0.6 -Serum albumin  is low at 2.9 --  WBC 22.2 , lactic acid is not --Sodium 134, potassium 3.8 -Creatinine 2.62 it was 1.54 on 01/29/2024 (baseline appears to be around  1.8-1.9) -Chest x-ray concerning for previously noted rib fractures as well as left-sided pneumonia - Review of Systems: As mentioned in the history of present illness. All other systems reviewed and are negative.   Hospital Course:   Brief Narrative:  78 y.o. male reformed smoker with medical history significant significant for CKD 3A, CAD status post prior angioplasty and stent placement, status post CABG x 5, HTN,, PVD, status post right and left femoropopliteal bypass, and GERD who who was admitted with community-acquired pneumonia and right-sided rib area discomfort      -Assessment and Plan: 1)Community-acquired pneumonia------  POA - Productive cough and dyspnea as well as rib cage pain persist -WBC 22.2 >>14.0>>11.9 - Treated with IV Rocephin /azithromycin , blood cultures NGTD - Treated with mucolytics and bronchodilators as ordered -- Cough, dyspnea and respiratory symptoms have improved significantly -Ambulated with staff in the hallways today and postablation O2 sats is 93% on room air -Okay to discharge on Omnicef  and azithromycin    2)CAD--status post prior angioplasty and stent placement, status post CABG x 5 -- No ACS type symptoms -Continue metoprolol  -- Continue aspirin , Plavix  and atorvastatin  for secondary prevention -- 3)PAD--status post right and left femoropopliteal bypass - Asymptomatic at this time continue aspirin , Plavix , Pletal  and atorvastatin    4)DM2- A1C is 7.6 reflecting uncontrolled DM with hyperglycemia PTA  --- Resume PTA diabetic regimen including Lantus , Ozempic, metformin  and Jardiance    5)Mild Hyponatremia due to HCTZ use-- -Sodium is stable -Do not restart HCTZ   6)AKI---- on CKD 3A Creatinine 2.62 >>2.29 >>2.11 -- (creatinine was 1.54 on 01/29/2024 and 1.85 on 01/28/2024) Do not restart HCTZ -Overall renal function improving -Repeat BMP as outpatient in about a week or so -- renally adjust medications, avoid nephrotoxic agents /  dehydration  / hypotension   7)HTN--continue amlodipine , and metoprolol    8)GERD--stable continue Protonix    9) diabetic polyneuropathy--- continue Lyrica    10) recent nontraumatic rib fractures--- supportive and symptomatic treatment   11)Bony Lesions--prior PET scan from May 2025 prior imaging studies suggest--: Lytic bony lesions with areas of multifocal mild areas of abnormal uptake along the skeleton including several ribs, vertebral bodies and scattered along the pelvis. Some of these lesions are associated with areas of bony sclerosis.  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 -- Oncology as previously recommended iron  supplementation - Hgb dropped to 7.7 from 9.0 on admission due to hemodilution --  Hgb stable after dropping initially with IVF   Disposition: The patient is from: Home              Anticipated d/c is to: Home  Discharge Condition:   Follow UP   Follow-up Information     [facility] Schedule an appointment as soon as possible for a visit in 1 week(s).   Why: Repeat CBC and BMP Blood Tests Contact information: [address] [facility] [place] [address] [phone]                  Diet and Activity recommendation:  As advised  Discharge Instructions    Discharge Instructions     Call MD for:  difficulty breathing, headache or visual disturbances   Complete by: As directed    Call MD for:  persistant dizziness or light-headedness   Complete by: As directed    Call MD for:  persistant nausea and vomiting   Complete by: As directed    Call MD for:  temperature >100.4   Complete by: As directed    Diet - low sodium  heart healthy   Complete by: As directed    Discharge instructions   Complete by: As directed    1)Avoid ibuprofen /Advil /Aleve/Motrin Josefine Powders/Naproxen/BC powders/Meloxicam/Diclofenac/Indomethacin and other Nonsteroidal anti-inflammatory medications as these will make you more likely to bleed and can cause stomach ulcers, can also cause Kidney problems.   2)Repeat BMP and CBC Blood Tests in 1 week advised  3)Follow up with primary care physician 1 to 2 weeks for recheck   Increase activity slowly   Complete by: As directed         Discharge Medications     Allergies as of 03/23/2024       Reactions   Baclofen Other (See Comments)   Altered mental status, extreme drowsiness   Acarbose Diarrhea        Medication List     TAKE these medications    amLODipine  10 MG tablet Commonly known as: NORVASC  Take 10 mg by mouth in the morning.   aspirin  EC 81 MG tablet Take 1 tablet (81 mg total) by mouth daily with breakfast.   atorvastatin  40 MG tablet Commonly known as: LIPITOR  Take 40 mg by mouth at bedtime.   azithromycin  500 MG tablet Commonly known as: ZITHROMAX  Take 1 tablet (500 mg total) by mouth daily for 3 days.   cefdinir  300 MG capsule Commonly known as: OMNICEF  Take 1 capsule (300 mg total) by mouth 2 (two) times daily for 5 days.   cilostazol  100 MG tablet Commonly known as: PLETAL  Take 100 mg by mouth 2 (two) times daily.   clopidogrel  75 MG tablet Commonly known as: PLAVIX  Take 75 mg by mouth in the morning.   cyanocobalamin  1000 MCG tablet Commonly known as: VITAMIN B12 Take 1,000 mcg by mouth in the morning.   guaiFENesin  600 MG 12 hr tablet Commonly known as: Mucinex  Take 1 tablet (600 mg total) by mouth 2 (two) times daily.   insulin  glargine 100 UNIT/ML injection Commonly known as: LANTUS  Inject 0.1 mLs (10 Units total) into the skin at bedtime. What changed: how much to take   magnesium  oxide 400 MG tablet Commonly known as:  MAG-OX Take 400 mg by mouth 2 (two) times daily.   metoprolol  succinate 50 MG 24 hr tablet Commonly known as: TOPROL -XL Take 0.5 tablets (25 mg total) by mouth daily.   pantoprazole  40 MG tablet Commonly known as: PROTONIX  Take 1  tablet (40 mg total) by mouth daily. What changed: when to take this   predniSONE  20 MG tablet Commonly known as: DELTASONE  Take 2 tablets (40 mg total) by mouth daily with breakfast for 5 days.   pregabalin  75 MG capsule Commonly known as: LYRICA  Take 75 mg by mouth 2 (two) times daily.       Major procedures and Radiology Reports - PLEASE review detailed and final reports for all details, in brief -   DG Chest 2 View Result Date: 03/21/2024 CLINICAL DATA:  Right rib pain. EXAM: CHEST - 2 VIEW COMPARISON:  March 12, 2024. FINDINGS: Normal cardiomediastinal silhouette. Status post coronary bypass graft. Mildly increased left midlung and basilar opacity is noted concerning for worsening pneumonia or atelectasis. Multiple right rib fractures are noted as described on prior exam. IMPRESSION: Mildly increased left midlung and basilar opacity is noted concerning for worsening pneumonia or atelectasis. Multiple right rib fractures are again noted. Electronically Signed   By: Lynwood Landy Raddle M.D.   On: 03/21/2024 13:57   DG Ribs Unilateral W/Chest Right Result Date: 03/03/2024 CLINICAL DATA:  History of fall 3 weeks ago with right posterior rib and back pain EXAM: RIGHT RIBS AND CHEST - 3 VIEW COMPARISON:  Chest radiograph dated 01/13/2024 FINDINGS: Healing mildly displaced right lateral fifth and seventh rib fractures. There is no evidence of pneumothorax or pleural effusion. Patchy right midlung opacity. Decreased left mid lung linear opacity. Heart size and mediastinal contours are within normal limits. Median sternotomy wires are nondisplaced. IMPRESSION: 1. Healing mildly displaced right lateral fifth and seventh rib fractures. No pneumothorax. 2. Patchy right  midlung opacity, likely atelectasis. Electronically Signed   By: Limin  Xu M.D.   On: 03/03/2024 13:01   Micro Results  Recent Results (from the past 240 hours)  Blood culture (routine x 2)     Status: None (Preliminary result)   Collection Time: 03/21/24  2:51 PM   Specimen: Right Antecubital; Blood  Result Value Ref Range Status   Specimen Description   Final    RIGHT ANTECUBITAL BOTTLES DRAWN AEROBIC AND ANAEROBIC   Special Requests Blood Culture adequate volume  Final   Culture   Final    NO GROWTH 2 DAYS Performed at [facility], [address], [place], [place] [address]    Report Status PENDING  Incomplete  Blood culture (routine x 2)     Status: None (Preliminary result)   Collection Time: 03/21/24  2:51 PM   Specimen: Left Antecubital; Blood  Result Value Ref Range Status   Specimen Description   Final    LEFT ANTECUBITAL BOTTLES DRAWN AEROBIC AND ANAEROBIC   Special Requests Blood Culture adequate volume  Final   Culture   Final    NO GROWTH 2 DAYS Performed at [facility], [address] [place], [place] [address]    Report Status PENDING  Incomplete   Today   Subjective    [name] today has no new complaints No fever  Or chills  - Ambulated with staff in hallways, O2 sats 93% on room air  No Nausea, Vomiting or Diarrhea   Patient has been seen and examined prior to discharge   Objective   Blood pressure (!) 132/93, pulse 95, temperature 98.1 F (36.7 C), temperature source Oral, resp. rate 17, height 5' 7 (1.702 m), weight 61.1 kg, SpO2 94%.   Intake/Output Summary (Last 24 hours) at 03/23/2024 1015 Last data filed at 03/23/2024 0556 Gross per 24 hour  Intake 250 ml  Output --  Net 250 ml    Exam Gen:- Awake Alert, no acute distress, no dyspnea on exertion HEENT:- [place].AT, No sclera icterus Neck-Supple Neck,No JVD,.  Lungs-improved air movement, no wheezes or rhonchi CV- S1, S2 normal, regular Abd-  +ve B.Sounds, Abd Soft, No tenderness,     Extremity/Skin:- No  edema,   good pulses Psych-affect is appropriate, oriented x3 Neuro-no new focal deficits, no tremors    Data Review   CBC w Diff:  Lab Results  Component Value Date   WBC 11.9 (H) 03/23/2024   HGB 7.7 (L) 03/23/2024   HGB 10.8 (L) 02/20/2023   HCT 24.2 (L) 03/23/2024   HCT 34.6 (L) 02/20/2023   PLT 412 (H) 03/23/2024   PLT 456 (H) 02/20/2023   LYMPHOPCT 6 03/21/2024   BANDSPCT 33 12/12/2017   MONOPCT 6 03/21/2024   EOSPCT 0 03/21/2024   BASOPCT 0 03/21/2024   CMP:  Lab Results  Component Value Date   NA 133 (L) 03/23/2024   NA 139 02/20/2023   K 3.6 03/23/2024   CL 101 03/23/2024   CO2 22 03/23/2024   BUN 31 (H) 03/23/2024   BUN 24 02/20/2023   CREATININE 2.11 (H) 03/23/2024   CREATININE 1.28 (H) 04/25/2016   PROT 7.3 03/21/2024   PROT 6.8 02/20/2023   ALBUMIN  2.9 (L) 03/21/2024   ALBUMIN  4.3 02/20/2023   BILITOT 0.6 03/21/2024   BILITOT 0.4 02/20/2023   ALKPHOS 115 03/21/2024   AST 17 03/21/2024   ALT 16 03/21/2024   Total Discharge time is about 33 minutes  Rendall Carwin M.D on 03/23/2024 at 10:15 AM  Go to [email or web address] -  for contact info  [facility] - Office  [phone]

## 2024-03-23 NOTE — Plan of Care (Signed)

## 2024-03-24 ENCOUNTER — Encounter: Payer: Self-pay | Admitting: Oncology

## 2024-03-25 NOTE — Progress Notes (Signed)
 Follow Up Visit   Patient Name: [name], male   Patient DOB: 02/25/1946 Date of Service: 03/25/2024  Patient MRN: [id number] Provider Creating Note: Bonnell Sherry, MD  [phone] Primary Care Physician:   [address] [place] [address] Additional Physicians/ Providers:    History of Present Illness [name] is a 78 y.o. male who is following up today for diabetes mellitus type 2 with chronic kidney disease, chronic kidney disease stage IIIB, hypertension, anemia of chronic kidney disease, secondary hyperparathyroidism, and positive ANA.  The patient's chronic kidney disease was found to be stable during the hospital admission recently.  Towards his discharge eGFR was 32. Hypertension blood pressure currently 130/70.  He was advised to discontinue HCTZ however patient is unsure as to whether he stopped the medication.  He also has ongoing significant anemia of chronic kidney disease with most recent hemoglobin of 7.7.  He is due for follow-up back at the cancer center at [facility].  Finally patient also has secondary hyperparathyroidism that will require follow-up today.   Medications   Current Outpatient Medications:  .  albuterol  HFA (PROVENTIL  HFA;VENTOLIN  HFA) 108 (90 Base) MCG/ACT inhaler, Inhale, Disp: , Rfl:  .  amLODIPine  (NORVASC ) 10 MG tablet, Take 1 tablet by mouth 1 (one) time each day, Disp: , Rfl:  .  aspirin  (ST JOSEPH) 81 MG EC tablet, Take 81 mg by mouth, Disp: , Rfl:  .  atorvastatin  (LIPITOR ) 40 MG tablet, Take 1 tablet by mouth every night, Disp: , Rfl:  .  cholestyramine (QUESTRAN) 4 g packet, Take 2 g by mouth, Disp: , Rfl:  .  cilostazol  (PLETAL ) 100 MG tablet, Take 1 tablet by mouth in the morning and 1 tablet in the evening., Disp: , Rfl:  .  clopidogrel  (PLAVIX ) 75 MG tablet, Take 1 tablet by mouth 1 (one) time each day, Disp: , Rfl:  .  cyanocobalamin  (VITAMIN B-12) 1000 MCG tablet, Take 1,000 mcg by mouth every morning, Disp: , Rfl:  .   Empagliflozin  25 MG tablet, Take 12.5 mg by mouth, Disp: , Rfl:  .  hydroCHLOROthiazide  12.5 MG tablet, Take 12.5 mg by mouth every morning, Disp: , Rfl:  .  insulin  glargine (LANTUS ) 100 UNIT/ML injection, Inject 20 Units under the skin, Disp: , Rfl:  .  losartan  (COZAAR ) 50 MG tablet, Take 50 mg by mouth 1 (one) time each day, Disp: , Rfl:  .  metoprolol  tartrate (LOPRESSOR ) 50 MG tablet, Take 25 mg by mouth, Disp: , Rfl:  .  pantoprazole  (PROTONIX ) 40 MG EC tablet, Take 40 mg by mouth, Disp: , Rfl:  .  pregabalin  (LYRICA ) 75 MG capsule, Take 75 mg by mouth, Disp: , Rfl:    Allergies Acarbose  Problem List There is no problem list on file for this patient.    Review of Systems  Constitutional:  Negative for chills and fever.  Respiratory:  Negative for cough and shortness of breath.   Cardiovascular:  Negative for chest pain and palpitations.  Gastrointestinal:  Negative for nausea and vomiting.  Genitourinary:  Negative for dysuria, hematuria and urgency.     History Past Medical History:  Diagnosis Date  . Acute kidney failure (HCC)   . Chronic kidney disease   . Coronary atherosclerosis of unspecified type of vessel, native or graft   . Diabetes mellitus without mention of complication, type II or unspecified type, not stated as uncontrolled (HCC)   . Esophageal reflux   . Essential hypertension   .  Hyposmolality and/or hyponatremia   . Other and unspecified hyperlipidemia   . Urinary tract infection     History reviewed. No pertinent surgical history. Family History  Problem Relation Age of Onset  . Heart disease Mother   . Stroke Mother   . Cancer Brother    Social History   Tobacco Use  . Smoking status: Former    Types: Cigarettes, Cigars  . Smokeless tobacco: Never  Substance Use Topics  . Alcohol  use: Not Currently        Physical Exam  Vitals BP 130/70 (BP Location: Left upper arm, Patient Position: Sitting)   Pulse 91   Temp 98.5 F   Wt  141 lb (64 kg)   SpO2 90%   BMI 21.44 kg/m   PHYSICAL EXAM: General appearance: well developed, well nourished, NAD Eyes: anicteric sclerae, moist conjunctivae; no lid-lag  HENT: Atraumatic; hearing intact Neck: Trachea midline; supple Lungs: Left basilar rales, normal effort CV: S1S2, no murmurs or rubs. Abdomen: Soft, non-tender; bowel sounds present Extremities: No peripheral edema Skin: Warm and dry, normal skin turgor, no rashes noted. Psych: Appropriate affect, alert and oriented to person, place and time    Laboratory Studies  Chemistry  Lab Units 10/08/23 0658 10/07/23 0415 10/06/23 1109 10/06/23 1108 09/06/23 1310 04/28/23 1517 03/14/23 1201  SODIUM mmol/L 138 138 138  --  135 135 136  POTASSIUM mmol/L 4.1 4.4 4.7  --  3.8 3.7 4.2  CHLORIDE mmol/L 101 104 101  --  97* 95* 98  CO2 mmol/L 24.1 25.9 24.0  --  22 21.9 22.5  ANION GAP mmol/L 13* 8 13*  --   --  >15* >15*  MAGNESIUM  mg/dL  --   --   --   --   --   --  1.2*  CALCIUM  mg/dL 9.0 8.7 9.0  --  9.8 8.5 9.5  PHOSPHORUS mg/dL  --   --   --   --  3.3  --   --   ALK PHOS U/L  --   --  94  --   --  102 109  PTH pg/mL  --   --   --   --  86*  --   --   GLUCOSE mg/dL 72 852 865  --  774* 809* 113  ALBUMIN  g/dL  --   --  3.1*  --  4.2  4.4 3.6 3.8  BUN mg/dL 28* 37* 35*  --  30* 31* 38*  CREATININE mg/dL 7.81* 7.64* 7.27*  --  2.30* 2.81* 2.79*  HEMOGLOBIN A1C %  --   --   --  6.9*  --   --   --         No lab exists for component: IRON  SATURATION, TRANSSATPER  CBC  Lab Units 09/06/23 1310  WBC AUTO Thousand/uL 12.1*  HEMOGLOBIN g/dL 87.7*  HEMATOCRIT % 62.2*  MCV fL 86.1  PLATELETS AUTO Thousand/uL 439*        No lab exists for component: GLUCOSEUR, BILIRUBINUR, SPECGRAV, RBCUR, LEUKOCYTESUR, NITRITE  Lab Results  Component Value Date   PTH 86 (H) 09/06/2023   CALCIUM  9.0 10/08/2023   PHOS 3.3 09/06/2023     Imaging and Other Studies  09/06/2023: Negative SPEP.  Positive  ANA with positive antichromatin antibody. 09/11/2023 renal ultrasound: Right kidney 9.1 cm, left kidney 9.0 cm.  1 cyst in the right kidney and 2 cyst noted in the left kidney   Orders Placed This Encounter  .  CBC and Differential  . Renal Function Panel  . PTH, Intact  . Urine Albumin  / Creatinine Ratio       Impression/Recommendations  [name] is a 78 y.o. male with past medical history of hypertension, hyperlipidemia, coronary artery disease status post CABG in 2017, diabetes mellitus type 2, history of recurrent pneumonia who was referred for evaluation of chronic kidney disease stage IIIb.  1.  Diabetes mellitus type 2 with chronic kidney disease/chronic kidney disease stage IIIB.  Patient had recent hospitalization.  Towards end of his hospitalization renal function improved with eGFR up to 32.  We plan to follow-up renal parameters today.  Maintain the patient on Jardiance  and losartan .  2.  Hypertension.  Blood pressure currently 130/70.  He was instructed to stop hydrochlorothiazide .  We will continue amlodipine , losartan , metoprolol  for hypertension control.  3.  Anemia of chronic kidney disease.  Most recent hemoglobin down to 7.7.  I have advised the patient to reschedule an appointment at the cancer center at [facility] for this issue.  4.  Secondary hyperparathyroidism.  Follow-up PTH, phosphorus, and calcium  level today.  Return in about 4 months (around 07/25/2024).   Munsoor Lateef, MD

## 2024-03-26 ENCOUNTER — Inpatient Hospital Stay: Attending: Hematology

## 2024-03-26 ENCOUNTER — Other Ambulatory Visit: Payer: Self-pay | Admitting: *Deleted

## 2024-03-26 DIAGNOSIS — D508 Other iron deficiency anemias: Secondary | ICD-10-CM

## 2024-03-26 DIAGNOSIS — E871 Hypo-osmolality and hyponatremia: Secondary | ICD-10-CM | POA: Diagnosis not present

## 2024-03-26 DIAGNOSIS — C7951 Secondary malignant neoplasm of bone: Secondary | ICD-10-CM | POA: Diagnosis present

## 2024-03-26 DIAGNOSIS — D72825 Bandemia: Secondary | ICD-10-CM | POA: Diagnosis not present

## 2024-03-26 DIAGNOSIS — C801 Malignant (primary) neoplasm, unspecified: Secondary | ICD-10-CM | POA: Diagnosis present

## 2024-03-26 DIAGNOSIS — N184 Chronic kidney disease, stage 4 (severe): Secondary | ICD-10-CM | POA: Diagnosis present

## 2024-03-26 LAB — CBC
HCT: 29.5 % — ABNORMAL LOW (ref 39.0–52.0)
Hemoglobin: 9.3 g/dL — ABNORMAL LOW (ref 13.0–17.0)
MCH: 25.9 pg — ABNORMAL LOW (ref 26.0–34.0)
MCHC: 31.5 g/dL (ref 30.0–36.0)
MCV: 82.2 fL (ref 80.0–100.0)
Platelets: 626 K/uL — ABNORMAL HIGH (ref 150–400)
RBC: 3.59 MIL/uL — ABNORMAL LOW (ref 4.22–5.81)
RDW: 19.5 % — ABNORMAL HIGH (ref 11.5–15.5)
WBC: 23.2 K/uL — ABNORMAL HIGH (ref 4.0–10.5)
nRBC: 0 % (ref 0.0–0.2)

## 2024-03-26 LAB — IRON AND TIBC
Iron: 38 ug/dL — ABNORMAL LOW (ref 45–182)
Saturation Ratios: 17 % — ABNORMAL LOW (ref 17.9–39.5)
TIBC: 228 ug/dL — ABNORMAL LOW (ref 250–450)
UIBC: 190 ug/dL

## 2024-03-26 LAB — CULTURE, BLOOD (ROUTINE X 2)
Culture: NO GROWTH
Culture: NO GROWTH
Special Requests: ADEQUATE
Special Requests: ADEQUATE

## 2024-03-26 LAB — FERRITIN: Ferritin: 303 ng/mL (ref 24–336)

## 2024-03-26 LAB — SAMPLE TO BLOOD BANK

## 2024-03-26 NOTE — Progress Notes (Signed)
 Daughter called to advise that patient's hgb 7.7 @ Dr. Juanito office on 8/10.  Dr. Rachele suggested that he have redrawn today and consult with our office if blood was needed.  Will bring in today for labs and consult with Delon Hope with results.

## 2024-03-29 ENCOUNTER — Encounter (HOSPITAL_COMMUNITY): Payer: Self-pay

## 2024-03-29 ENCOUNTER — Other Ambulatory Visit: Payer: Self-pay

## 2024-03-29 ENCOUNTER — Emergency Department (HOSPITAL_COMMUNITY)

## 2024-03-29 ENCOUNTER — Emergency Department (HOSPITAL_COMMUNITY)
Admission: EM | Admit: 2024-03-29 | Discharge: 2024-03-29 | Disposition: A | Discharge status: Home / Self Care | Attending: Emergency Medicine | Admitting: Emergency Medicine

## 2024-03-29 DIAGNOSIS — W19XXXA Unspecified fall, initial encounter: Secondary | ICD-10-CM

## 2024-03-29 DIAGNOSIS — Z7901 Long term (current) use of anticoagulants: Secondary | ICD-10-CM | POA: Insufficient documentation

## 2024-03-29 DIAGNOSIS — D72829 Elevated white blood cell count, unspecified: Secondary | ICD-10-CM | POA: Insufficient documentation

## 2024-03-29 DIAGNOSIS — R32 Unspecified urinary incontinence: Secondary | ICD-10-CM | POA: Diagnosis present

## 2024-03-29 DIAGNOSIS — W109XXA Fall (on) (from) unspecified stairs and steps, initial encounter: Secondary | ICD-10-CM | POA: Insufficient documentation

## 2024-03-29 DIAGNOSIS — Z7982 Long term (current) use of aspirin: Secondary | ICD-10-CM | POA: Diagnosis not present

## 2024-03-29 LAB — COMPREHENSIVE METABOLIC PANEL WITH GFR
ALT: 15 U/L (ref 0–44)
AST: 14 U/L — ABNORMAL LOW (ref 15–41)
Albumin: 2.9 g/dL — ABNORMAL LOW (ref 3.5–5.0)
Alkaline Phosphatase: 125 U/L (ref 38–126)
Anion gap: 13 (ref 5–15)
BUN: 39 mg/dL — ABNORMAL HIGH (ref 8–23)
CO2: 22 mmol/L (ref 22–32)
Calcium: 8.3 mg/dL — ABNORMAL LOW (ref 8.9–10.3)
Chloride: 94 mmol/L — ABNORMAL LOW (ref 98–111)
Creatinine, Ser: 2.31 mg/dL — ABNORMAL HIGH (ref 0.61–1.24)
GFR, Estimated: 29 mL/min — ABNORMAL LOW (ref >60)
Glucose, Bld: 292 mg/dL — ABNORMAL HIGH (ref 70–99)
Potassium: 4.2 mmol/L (ref 3.5–5.1)
Sodium: 129 mmol/L — ABNORMAL LOW (ref 135–145)
Total Bilirubin: 0.7 mg/dL (ref 0.0–1.2)
Total Protein: 6.7 g/dL (ref 6.5–8.1)

## 2024-03-29 LAB — CBC WITH DIFFERENTIAL/PLATELET
Abs Immature Granulocytes: 1.17 K/uL — ABNORMAL HIGH (ref 0.00–0.07)
Basophils Absolute: 0.2 K/uL — ABNORMAL HIGH (ref 0.0–0.1)
Basophils Relative: 1 %
Eosinophils Absolute: 0.3 K/uL (ref 0.0–0.5)
Eosinophils Relative: 2 %
HCT: 30.8 % — ABNORMAL LOW (ref 39.0–52.0)
Hemoglobin: 9.8 g/dL — ABNORMAL LOW (ref 13.0–17.0)
Immature Granulocytes: 5 %
Lymphocytes Relative: 6 %
Lymphs Abs: 1.4 K/uL (ref 0.7–4.0)
MCH: 26.1 pg (ref 26.0–34.0)
MCHC: 31.8 g/dL (ref 30.0–36.0)
MCV: 81.9 fL (ref 80.0–100.0)
Monocytes Absolute: 1.9 K/uL — ABNORMAL HIGH (ref 0.1–1.0)
Monocytes Relative: 8 %
Neutro Abs: 18 K/uL — ABNORMAL HIGH (ref 1.7–7.7)
Neutrophils Relative %: 78 %
Platelets: 550 K/uL — ABNORMAL HIGH (ref 150–400)
RBC: 3.76 MIL/uL — ABNORMAL LOW (ref 4.22–5.81)
RDW: 20.2 % — ABNORMAL HIGH (ref 11.5–15.5)
WBC: 23 K/uL — ABNORMAL HIGH (ref 4.0–10.5)
nRBC: 0 % (ref 0.0–0.2)

## 2024-03-29 LAB — URINALYSIS, ROUTINE W REFLEX MICROSCOPIC
Bacteria, UA: NONE SEEN
Bilirubin Urine: NEGATIVE
Glucose, UA: >=500 mg/dL — AB
Hgb urine dipstick: NEGATIVE
Ketones, ur: NEGATIVE mg/dL
Nitrite: NEGATIVE
Protein, ur: NEGATIVE mg/dL
Specific Gravity, Urine: 1.011 (ref 1.005–1.030)
pH: 7 (ref 5.0–8.0)

## 2024-03-29 LAB — MAGNESIUM: Magnesium: 2.2 mg/dL (ref 1.7–2.4)

## 2024-03-29 MED ORDER — LEVOFLOXACIN 750 MG PO TABS: 750.0000 mg | ORAL_TABLET | Freq: Every day | ORAL | 0 refills | Status: DC

## 2024-03-29 NOTE — Discharge Instructions (Signed)
 Your testing today shows that you have elevated blood counts.  I did speak with your blood specialist and cancer doctors and they want you to be seen quickly this week in follow-up.  They will make an appointment for you and contact you.  In the meantime I would like for you to take Levaquin  once a day for the next 7 days to treat any residual pneumonia that may be there.  Return to the emergency department for any severe or worsening symptoms

## 2024-03-29 NOTE — ED Triage Notes (Signed)
 Pt fell this morning while trying to get ready. Pt did not hit his head or have LOC. Pt's daughter stated that pt seems confused this morning and was actually getting him ready to take him to his primary to get checked for UTI. Pt has new onset incontinence and some confusion that has been going on for a few days.

## 2024-03-29 NOTE — ED Provider Notes (Signed)
 [place] EMERGENCY DEPARTMENT AT [facility] Provider Note   CSN: [id number] Arrival date & time: 03/29/24  1059     Patient presents with: Felton Banda JACIEL DIEM is a 78 y.o. male.    Fall   This patient is a 78 year old male, history of peripheral vascular disease, history of a recent admission to the hospital for pneumonia, review of the medical record shows that the patient had been admitted on the eighth, discharged on the 10th and finished his antibiotics a couple days ago.  The patient does have a history of prior bypass grafting in 2017 and has known vascular disease in his leg, supposed to have some stenting coming up in September.  He is followed by vascular surgery.  The patient had a fall a couple of days ago he states he was walking down the stairs, he looked up to say hide to a neighbor lost his balance and fell down the last 2 stairs.  He states my legs are weak and that someone had to help me out.  This is an issue has been going on for couple months.  Then today while he was getting out of the shower he bent over to dry his hair off with a towel and felt like he was going down, he went to the ground.  He did have an episode of urinary incontinence in bed last night while he was sleeping which he states is unusual but has not had any fevers chills back pain or abdominal pain.  His daughter at the bedside reports that he has had some episodes of memory loss this week stating that he is asking the same question over and over like what day it is.      Prior to Admission medications   Medication Sig Start Date End Date Taking? Authorizing Provider  amLODipine  (NORVASC ) 10 MG tablet Take 10 mg by mouth in the morning.    [provider]  aspirin  EC 81 MG tablet Take 1 tablet (81 mg total) by mouth daily with breakfast. 05/23/19   Emokpae, Courage, MD  atorvastatin  (LIPITOR ) 40 MG tablet Take 40 mg by mouth at bedtime. 02/07/24   [provider]   cilostazol  (PLETAL ) 100 MG tablet Take 100 mg by mouth 2 (two) times daily. 02/07/24   [provider]  clopidogrel  (PLAVIX ) 75 MG tablet Take 75 mg by mouth in the morning.    [provider]  guaiFENesin  (MUCINEX ) 600 MG 12 hr tablet Take 1 tablet (600 mg total) by mouth 2 (two) times daily. 03/23/24 03/23/25  Pearlean Manus, MD  insulin  glargine (LANTUS ) 100 UNIT/ML injection Inject 0.1 mLs (10 Units total) into the skin at bedtime. Patient taking differently: Inject 20 Units into the skin at bedtime. 12/11/23   Johnson, Clanford L, MD  magnesium  oxide (MAG-OX) 400 MG tablet Take 400 mg by mouth 2 (two) times daily. 12/04/23   [provider]  metoprolol  succinate (TOPROL -XL) 50 MG 24 hr tablet Take 0.5 tablets (25 mg total) by mouth daily. 03/23/24   Pearlean Manus, MD  pantoprazole  (PROTONIX ) 40 MG tablet Take 1 tablet (40 mg total) by mouth daily. 03/23/24   Pearlean Manus, MD  pregabalin  (LYRICA ) 75 MG capsule Take 75 mg by mouth 2 (two) times daily. 01/22/24   [provider]  vitamin B-12 (CYANOCOBALAMIN ) 1000 MCG tablet Take 1,000 mcg by mouth in the morning.    [provider]    Allergies: Baclofen and Acarbose  Review of Systems  All other systems reviewed and are negative.   Updated Vital Signs BP 128/87   Pulse 81   Temp 98.4 F (36.9 C) (Oral)   Resp 18   Ht 1.702 m (5' 7)   Wt 61.1 kg   SpO2 95%   BMI 21.10 kg/m   Physical Exam Vitals and nursing note reviewed.  Constitutional:      General: He is not in acute distress.    Appearance: He is well-developed.  HENT:     Head: Normocephalic and atraumatic.     Mouth/Throat:     Pharynx: No oropharyngeal exudate.  Eyes:     General: No scleral icterus.       Right eye: No discharge.        Left eye: No discharge.     Conjunctiva/sclera: Conjunctivae normal.     Pupils: Pupils are equal, round, and reactive to light.  Neck:     Thyroid : No thyromegaly.      Vascular: No JVD.  Cardiovascular:     Rate and Rhythm: Normal rate and regular rhythm.     Heart sounds: Normal heart sounds. No murmur heard.    No friction rub. No gallop.  Pulmonary:     Effort: Pulmonary effort is normal. No respiratory distress.     Breath sounds: Normal breath sounds. No wheezing or rales.  Abdominal:     General: Bowel sounds are normal. There is no distension.     Palpations: Abdomen is soft. There is no mass.     Tenderness: There is no abdominal tenderness.  Musculoskeletal:        General: No tenderness. Normal range of motion.     Cervical back: Normal range of motion and neck supple.  Lymphadenopathy:     Cervical: No cervical adenopathy.  Skin:    General: Skin is warm and dry.     Findings: No erythema or rash.     Comments: Abrasions to left knee  Neurological:     Mental Status: He is alert.     Coordination: Coordination normal.     Comments: Normal strength in all 4 extremities, cranial nerves III through XII are normal, the patient is able to answer my questions appropriately regarding orientation, his cognition is normal, he follows commands without any difficulty including coordination and strength in all 4 extremities as well as cranial nerves III through XII  Psychiatric:        Behavior: Behavior normal.     (all labs ordered are listed, but only abnormal results are displayed) Labs Reviewed  CBC WITH DIFFERENTIAL/PLATELET - Abnormal; Notable for the following components:      Result Value   WBC 23.0 (*)    RBC 3.76 (*)    Hemoglobin 9.8 (*)    HCT 30.8 (*)    RDW 20.2 (*)    Platelets 550 (*)    Neutro Abs 18.0 (*)    Monocytes Absolute 1.9 (*)    Basophils Absolute 0.2 (*)    Abs Immature Granulocytes 1.17 (*)    All other components within normal limits  COMPREHENSIVE METABOLIC PANEL WITH GFR - Abnormal; Notable for the following components:   Sodium 129 (*)    Chloride 94 (*)    Glucose, Bld 292 (*)    BUN 39 (*)     Creatinine, Ser 2.31 (*)    Calcium  8.3 (*)    Albumin  2.9 (*)    AST 14 (*)  GFR, Estimated 29 (*)    All other components within normal limits  URINALYSIS, ROUTINE W REFLEX MICROSCOPIC - Abnormal; Notable for the following components:   Color, Urine STRAW (*)    Glucose, UA >=500 (*)    Leukocytes,Ua TRACE (*)    All other components within normal limits  MAGNESIUM     EKG: None  Radiology: DG Chest Port 1 View Result Date: 03/29/2024 CLINICAL DATA:  Cough, pneumonia EXAM: PORTABLE CHEST 1 VIEW COMPARISON:  03/21/2024 FINDINGS: Single frontal view of the chest demonstrates a stable cardiac silhouette. There is persistent but improving bilateral perihilar airspace disease consistent with resolving pneumonia. No effusion or pneumothorax. Chronic elevation of the left hemidiaphragm. Moderate retained stool throughout the colon. IMPRESSION: 1. Persistent but improving bilateral perihilar airspace disease consistent with resolving pneumonia. Electronically Signed   By: Ozell Daring M.D.   On: 03/29/2024 12:50   CT Head Wo Contrast Result Date: 03/29/2024 CLINICAL DATA:  Neuro deficit, acute, stroke suspected. Fall today. Confusion and incontinence for a few days. EXAM: CT HEAD WITHOUT CONTRAST TECHNIQUE: Contiguous axial images were obtained from the base of the skull through the vertex without intravenous contrast. RADIATION DOSE REDUCTION: This exam was performed according to the departmental dose-optimization program which includes automated exposure control, adjustment of the mA and/or kV according to patient size and/or use of iterative reconstruction technique. COMPARISON:  Head CT 01/26/2024 FINDINGS: Brain: There is no evidence of an acute infarct, intracranial hemorrhage, mass, midline shift, or extra-axial fluid collection. Moderate cerebral atrophy and a normal variant cavum septum pellucidum et vergae are again noted. Cerebral white matter hypodensities are unchanged and nonspecific  but compatible with mild chronic small vessel ischemic disease. 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 Vascular: Calcified atherosclerosis at the skull base. No hyperdense vessel. Skull: No acute fracture or suspicious lesion. Sinuses/Orbits: The paranasal sinuses are well aerated. Unchanged large right mastoid effusion. Increased, partial opacification of the right middle ear cavity. No visible nasopharyngeal mass. Unremarkable orbits. Other: Partially visualized asymmetric fatty atrophy of the left parotid gland. IMPRESSION: 1. No evidence of acute intracranial abnormality. 2. Mild chronic small vessel ischemic disease. 3. Right mastoid and middle ear effusions. Electronically Signed   By: Dasie Hamburg M.D.   On: 03/29/2024 12:23     Procedures   Medications Ordered in the ED - No data to display                                  Medical Decision Making Amount and/or Complexity of Data Reviewed Labs: ordered. Radiology: ordered. ECG/medicine tests: ordered.    This patient presents to the ED for concern of fall this morning, weakness and urinary incontinence, this involves an extensive number of treatment options, and is a complaint that carries with it a high risk of complications and morbidity.  The differential diagnosis includes metabolic abnormalities, could be related to the brain, he does not have any back pain or weakness or numbness of the legs to any significant degree   Co morbidities / Chronic conditions that complicate the patient evaluation  Recent admission for pneumonia, known vascular disease on Plavix    Additional history obtained:  Additional history obtained from EMR External records from outside source obtained and reviewed including medical record including recent admission   Lab Tests:  I Ordered, and personally interpreted labs.  The pertinent results include: Leukocytosis, in fact  the white blood cell count is quite elevated  at 23,000, it appears that it was 23,003 days ago but had only been 12,003 days prior to that.  His hemoglobin seems stable over time and the platelets are consistently elevated over time.  The urinalysis reveals no significant infections, the metabolic panel shows chronic renal insufficiency which is close to where his baseline is, his LFTs are unremarkable and his magnesium  is normal.   Imaging Studies ordered:  I ordered imaging studies including chest x-ray I independently visualized and interpreted imaging which showed persistent but improving bilateral perihilar airspace disease consistent with resolving pneumonia CT scan of the head was performed showing chronic small vessel ischemic disease but no acute findings I agree with the radiologist interpretation   Cardiac Monitoring: / EKG:  The patient was maintained on a cardiac monitor.  I personally viewed and interpreted the cardiac monitored which showed an underlying rhythm of: Normal sinus rhythm   Problem List / ED Course / Critical interventions / Medication management  Ultimately this patient was in no distress and maintained essentially normal vital signs throughout.  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  With his white counts going back up I decided to consult with hematology and oncology who recommended that the patient have rapid evaluation in the clinic but not needing admission to the hospital.  They are concerned about myelofibrosis but believe this can be handled as an outpatient I ordered medication including extended week of Levaquin  to further treat possible ongoing mild pneumonia although the patient is not coughing nor is he hypoxic, but with the elevated white blood cell count we will  treat that just in case Reevaluation of the patient after these medicines showed that the patient improved and has been essentially asymptomatic throughout the emergency department today I have reviewed the patients home medicines and have made adjustments as needed   Consultations Obtained:  I requested consultation with the hematology team,  and discussed lab and imaging findings as well as pertinent plan - they recommend: Rapid outpatient follow-up which they will arrange   Social Determinants of Health:  Myofibroblastic disease   Test / Admission - Considered:  Considered admission but patient is very well-appearing at the time of discharge agreeable with normal vital signs      Final diagnoses:  Fall, initial encounter  Leukocytosis, unspecified type      Cleotilde Rogue, MD 03/29/24 1516

## 2024-04-10 ENCOUNTER — Inpatient Hospital Stay (HOSPITAL_BASED_OUTPATIENT_CLINIC_OR_DEPARTMENT_OTHER): Admitting: Oncology

## 2024-04-10 VITALS — BP 169/63 | HR 98 | Temp 97.5°F | Resp 20 | Wt 137.8 lb

## 2024-04-10 DIAGNOSIS — D508 Other iron deficiency anemias: Secondary | ICD-10-CM

## 2024-04-10 DIAGNOSIS — M898X9 Other specified disorders of bone, unspecified site: Secondary | ICD-10-CM | POA: Diagnosis not present

## 2024-04-10 DIAGNOSIS — E871 Hypo-osmolality and hyponatremia: Secondary | ICD-10-CM

## 2024-04-10 DIAGNOSIS — C7951 Secondary malignant neoplasm of bone: Secondary | ICD-10-CM | POA: Diagnosis not present

## 2024-04-10 DIAGNOSIS — D72825 Bandemia: Secondary | ICD-10-CM | POA: Diagnosis not present

## 2024-04-10 NOTE — Progress Notes (Signed)
 Patient Care Team: Clinic, [facility] Lien as PCP - General Court Dorn PARAS, MD as PCP - Cardiology (Cardiology) [place], Peter M, MD as Consulting Physician (Cardiology) Court Dorn PARAS, MD as Consulting Physician (Cardiology) [facility], [facility]  Clinic Day:  04/10/2024  Referring physician: Clinic, Bonni Lien   CHIEF COMPLAINT:  CC: Metastatic (lytic) bone lesions   [name] 78 y.o. male was transferred to my care after his prior physician has left.   ASSESSMENT & PLAN:   Assessment & Plan: [name]  is a 78 y.o. male with Metastatic (lytic) bone lesions  Assessment & Plan Other iron  deficiency anemia Recent labs showed TSAT of 17 and ferritin of 303  - Labs consistent with thrombocytosis likely secondary to iron  deficiency - Start taking oral iron  every other day.  Use MiraLAX  for constipation - Will schedule for IV iron  for symptomatic relief Lytic bone lesions on xray Patient has multiple likely bone lesions with normal PSA and SPEP Bone biopsies x 2 negative for malignancy Patient had multiple admissions for pneumonia and has multiple rib fractures at this time  - Patient has a scheduled CT scan on 06/24/2024 - Will repeat labs including SPEP and immunoglobulins at this time  Return to clinic in November for follow-up.  If lesions are worsening, can consider rebiopsy.  If fractures continue to happen, can consider a bone modifying agent Bandemia Likely secondary to recent infection and pain (from rib fracture)  - Continue to monitor for now - Will consider further workup including flow cytometry if with unresolved Hyponatremia Patient has recent worsening of hyponatremia.  Asymptomatic  - Recommended close follow-up with his nephrologist    The patient understands the plans discussed today and is in agreement with them.  He knows to contact our office if he develops concerns prior to his next appointment.  40 minutes of total time was  spent for this patient encounter, including preparation, face-to-face counseling with the patient and coordination of care, physical exam, and documentation of the encounter. > 50% of the time was spent on counseling as documented under my assessment and plan.    LILLETTE Verneta SAUNDERS Teague,acting as a Neurosurgeon for Mickiel Dry, MD.,have documented all relevant documentation on the behalf of Mickiel Dry, MD,as directed by  Mickiel Dry, MD while in the presence of Mickiel Dry, MD.  I, Mickiel Dry MD, have reviewed the above documentation for accuracy and completeness, and I agree with the above.     Mickiel Dry, MD  [facility] [place] - A DEPT OF [facility] [address] [place] [address] Dept: [phone] Dept Fax: [phone]   Orders Placed This Encounter  Procedures   CBC with Differential    Standing Status:   Future    Expected Date:   05/22/2024    Expiration Date:   08/20/2024   Comprehensive metabolic panel    Standing Status:   Future    Expected Date:   05/22/2024    Expiration Date:   08/20/2024   Iron  and TIBC ([facility] ONLY)    Standing Status:   Future    Expected Date:   05/22/2024    Expiration Date:   08/20/2024   Ferritin    Standing Status:   Future    Expected Date:   05/22/2024    Expiration Date:   08/20/2024   Vitamin B12    Standing Status:   Future  Expected Date:   05/22/2024    Expiration Date:   08/20/2024   Folate    Standing Status:   Future    Expected Date:   05/22/2024    Expiration Date:   08/20/2024     ONCOLOGY HISTORY:   I have reviewed his chart and materials related to his cancer extensively and collaborated history with the patient. Summary of oncologic history is as follows:   Diagnosis: Metastatic (lytic) bone lesions   -10/2023: Presentation: Right anterior chest wall pain  -12/07/2023: CT chest: New fracture through the superior endplate of the  T9 vertebral body at the site of the mixed sclerotic and lucent lesion seen on prior CT and MRI exams, consistent with pathologic fracture. -12/07/2023: MRI thoracic spine: Pathologic compression fracture at T9-10 with abnormal enhancement extending into an expanded right pedicle. Multiple enhancing lesions throughout the thoracic spine as described compatible with metastatic disease or multiple myeloma. Multiple rib lesions bilaterally without a pathologic rib fracture. Central T2 hyperintensity in the cord at the T2-3 level is nonspecific but may represent a small focus of myelomalacia. -12/07/2023: MRI lumbar spine: Multiple enhancing lesions throughout the lumbar spine, visualized sacrum and iliac bones compatible with metastatic disease or multiple myeloma. No pathologic fractures. -12/07/2023: PSA: 3.89. SPEP: M-spike not observed, normal FLC ratio with elevated kappa and lambda light chains. CEA, CA 19-9 and AFP normal.  -12/10/2023: CTA chest: Multiple lytic lesions within the T7 and T9 vertebral bodies, with pathologic fracture of the T9 vertebral body, and right 4 and 7 ribs, and pathologic fracture of the right seventh rib. -12/27/2023: Initial PET: Multifocal mild areas of abnormal uptake along the skeleton including several ribs, vertebral bodies and scattered along the pelvis. Bilateral rib fractures are seen.  -12/26/2023: T9 bone biopsy.  Pathology: Scant fragment of bone with evidence of bony remodeling and minimal marrow space with fibrosis and spindled cells (interpreted as myofibroblasts).  -01/15/2024: L3 bone biopsy.  Pathology: Normocellular bone marrow with active hematopoiesis and evidence of normal trilineage maturation. No metastatic carcinoma identified.  -01/23/2024: Immunoglobulin levels within normal limits.  -01/28/2024: MRI cervical spine: No acute cervical spine fracture. No destructive bony lesions.   INTERVAL HISTORY:   [name] is here today for follow up.  Patient is accompanied by his wife today.   He has been hospitalized eight times since January for various reasons, including pneumonia, rib fractures, and dizziness. A fall on the day of discharge from one of these hospitalizations resulted in two broken ribs. He has experienced recurrent pneumonia, particularly in the summertime, and was on antibiotics until two days ago. His white blood cell count and platelets remain elevated.  He has a history of low sodium and elevated creatinine levels.  He has a nephrologist.  Denies fever, chills, weight loss, loss of appetite, night sweats.  He is feeling better since hospital discharge.  I have reviewed the past medical history, past surgical history, social history and family history with the patient and they are unchanged from previous note.  ALLERGIES:  is allergic to baclofen and acarbose.  MEDICATIONS:  Current Outpatient Medications  Medication Sig Dispense Refill   amLODipine  (NORVASC ) 10 MG tablet Take 10 mg by mouth in the morning.     aspirin  EC 81 MG tablet Take 1 tablet (81 mg total) by mouth daily with breakfast. 30 tablet 2   atorvastatin  (LIPITOR ) 40 MG tablet Take 40 mg by mouth at bedtime.     cilostazol  (PLETAL ) 100 MG  tablet Take 100 mg by mouth 2 (two) times daily.     clopidogrel  (PLAVIX ) 75 MG tablet Take 75 mg by mouth in the morning.     guaiFENesin  (MUCINEX ) 600 MG 12 hr tablet Take 1 tablet (600 mg total) by mouth 2 (two) times daily. 60 tablet 2   insulin  glargine (LANTUS ) 100 UNIT/ML injection Inject 0.1 mLs (10 Units total) into the skin at bedtime. (Patient taking differently: Inject 20 Units into the skin at bedtime.)     levofloxacin  (LEVAQUIN ) 750 MG tablet Take 1 tablet (750 mg total) by mouth daily. 7 tablet 0   magnesium  oxide (MAG-OX) 400 MG tablet Take 400 mg by mouth 2 (two) times daily.     metoprolol  succinate (TOPROL -XL) 50 MG 24 hr tablet Take 0.5 tablets (25 mg total) by mouth daily. 30 tablet 5    pantoprazole  (PROTONIX ) 40 MG tablet Take 1 tablet (40 mg total) by mouth daily. 30 tablet 5   pregabalin  (LYRICA ) 75 MG capsule Take 75 mg by mouth 2 (two) times daily.     vitamin B-12 (CYANOCOBALAMIN ) 1000 MCG tablet Take 1,000 mcg by mouth in the morning.     No current facility-administered medications for this visit.    REVIEW OF SYSTEMS:   Constitutional: Denies fevers, chills or abnormal weight loss Eyes: Denies blurriness of vision Ears, nose, mouth, throat, and face: Denies mucositis or sore throat Respiratory: Denies cough, dyspnea or wheezes Cardiovascular: Denies palpitation, chest discomfort or lower extremity swelling Gastrointestinal:  Denies nausea, heartburn or change in bowel habits Skin: Denies abnormal skin rashes Lymphatics: Denies new lymphadenopathy or easy bruising Neurological:Denies numbness, tingling or new weaknesses Behavioral/Psych: Mood is stable, no new changes  All other systems were reviewed with the patient and are negative.   VITALS:  Blood pressure (!) 169/63, pulse 98, temperature (!) 97.5 F (36.4 C), temperature source Oral, resp. rate 20, weight 137 lb 12.6 oz (62.5 kg), SpO2 94%.  Wt Readings from Last 3 Encounters:  04/10/24 137 lb 12.6 oz (62.5 kg)  03/29/24 134 lb 11.2 oz (61.1 kg)  03/21/24 134 lb 11.2 oz (61.1 kg)    Body mass index is 21.58 kg/m.  Performance status (ECOG): 1 - Symptomatic but completely ambulatory  PHYSICAL EXAM:   GENERAL:alert, no distress and comfortable SKIN: skin color, texture, turgor are normal, no rashes or significant lesions CHEST: Tenderness in the right lateral side of the chest LYMPH:  no palpable lymphadenopathy in the cervical, axillary or inguinal LUNGS: clear to auscultation and percussion with normal breathing effort HEART: regular rate & rhythm and no murmurs and no lower extremity edema ABDOMEN:abdomen soft, non-tender and normal bowel sounds Musculoskeletal:no cyanosis of digits and no  clubbing  NEURO: alert & oriented x 3 with fluent speech, no focal motor/sensory deficits  LABORATORY DATA:  I have reviewed the data as listed  Lab Results  Component Value Date   WBC 23.0 (H) 03/29/2024   NEUTROABS 18.0 (H) 03/29/2024   HGB 9.8 (L) 03/29/2024   HCT 30.8 (L) 03/29/2024   MCV 81.9 03/29/2024   PLT 550 (H) 03/29/2024      Chemistry      Component Value Date/Time   NA 129 (L) 03/29/2024 1158   NA 139 02/20/2023 1253   K 4.2 03/29/2024 1158   CL 94 (L) 03/29/2024 1158   CO2 22 03/29/2024 1158   BUN 39 (H) 03/29/2024 1158   BUN 24 02/20/2023 1253   CREATININE 2.31 (H) 03/29/2024 1158  CREATININE 1.28 (H) 04/25/2016 1038      Component Value Date/Time   CALCIUM  8.3 (L) 03/29/2024 1158   ALKPHOS 125 03/29/2024 1158   AST 14 (L) 03/29/2024 1158   ALT 15 03/29/2024 1158   BILITOT 0.7 03/29/2024 1158   BILITOT 0.4 02/20/2023 1253        Latest Reference Range & Units 03/26/24 10:57  Iron  45 - 182 ug/dL 38 (L)  UIBC ug/dL 809  TIBC 749 - 549 ug/dL 771 (L)  Saturation Ratios 17.9 - 39.5 % 17 (L)  Ferritin 24 - 336 ng/mL 303  (L): Data is abnormally low  RADIOGRAPHIC STUDIES: I have personally reviewed the radiological images as listed and agreed with the findings in the report.  DG Chest Port 1 View CLINICAL DATA:  Cough, pneumonia  EXAM: PORTABLE CHEST 1 VIEW  COMPARISON:  03/21/2024  FINDINGS: Single frontal view of the chest demonstrates a stable cardiac silhouette. There is persistent but improving bilateral perihilar airspace disease consistent with resolving pneumonia. No effusion or pneumothorax. Chronic elevation of the left hemidiaphragm. Moderate retained stool throughout the colon.  IMPRESSION: 1. Persistent but improving bilateral perihilar airspace disease consistent with resolving pneumonia.  Electronically Signed   By: Ozell Daring M.D.   On: 03/29/2024 12:50 CT Head Wo Contrast CLINICAL DATA:  Neuro deficit, acute,  stroke suspected. Fall today. Confusion and incontinence for a few days.  EXAM: CT HEAD WITHOUT CONTRAST  TECHNIQUE: Contiguous axial images were obtained from the base of the skull through the vertex without intravenous contrast.  RADIATION DOSE REDUCTION: This exam was performed according to the departmental dose-optimization program which includes automated exposure control, adjustment of the mA and/or kV according to patient size and/or use of iterative reconstruction technique.  COMPARISON:  Head CT 01/26/2024  FINDINGS: Brain: There is no evidence of an acute infarct, intracranial hemorrhage, mass, midline shift, or extra-axial fluid collection. Moderate cerebral atrophy and a normal variant cavum septum pellucidum et vergae are again noted. Cerebral white matter hypodensities are unchanged and nonspecific but compatible with mild chronic small vessel ischemic disease. 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  Vascular: Calcified atherosclerosis at the skull base. No hyperdense vessel.  Skull: No acute fracture or suspicious lesion.  Sinuses/Orbits: The paranasal sinuses are well aerated. Unchanged large right mastoid effusion. Increased, partial opacification of the right middle ear cavity. No visible nasopharyngeal mass. Unremarkable orbits.  Other: Partially visualized asymmetric fatty atrophy of the left parotid gland.  IMPRESSION: 1. No evidence of acute intracranial abnormality. 2. Mild chronic small vessel ischemic disease. 3. Right mastoid and middle ear effusions.  Electronically Signed   By: Dasie Hamburg M.D.   On: 03/29/2024 12:23

## 2024-04-10 NOTE — Patient Instructions (Addendum)
 [facility] at [facility] Discharge Instructions   You were seen and examined today by Dr. Davonna.  She reviewed the results of your lab work which are normal/stable.   We will see you back in November. We will repeat lab work and a CT scan prior to this visit.    Return as scheduled.    Thank you for choosing [facility] at [facility] to provide your oncology and hematology care.  To afford each patient quality time with our provider, please arrive at least 15 minutes before your scheduled appointment time.   If you have a lab appointment with the Cancer Center please come in thru the Main Entrance and check in at the main information desk.  You need to re-schedule your appointment should you arrive 10 or more minutes late.  We strive to give you quality time with our providers, and arriving late affects you and other patients whose appointments are after yours.  Also, if you no show three or more times for appointments you may be dismissed from the clinic at the providers discretion.     Again, thank you for choosing [facility].  Our hope is that these requests will decrease the amount of time that you wait before being seen by our physicians.       _____________________________________________________________  Should you have questions after your visit to [facility], please contact our office at [phone] and follow the prompts.  Our office hours are 8:00 a.m. and 4:30 p.m. Monday - Friday.  Please note that voicemails left after 4:00 p.m. may not be returned until the following business day.  We are closed weekends and major holidays.  You do have access to a nurse 24-7, just call the main number to the clinic [phone] and do not press any options, hold on the line and a nurse will answer the phone.    For prescription refill requests, have your pharmacy contact our office and allow 72 hours.    Due to  Covid, you will need to wear a mask upon entering the hospital. If you do not have a mask, a mask will be given to you at the Main Entrance upon arrival. For doctor visits, patients may have 1 support person age 42 or older with them. For treatment visits, patients can not have anyone with them due to social distancing guidelines and our immunocompromised population.

## 2024-04-12 ENCOUNTER — Encounter: Payer: Self-pay | Admitting: Oncology

## 2024-04-12 DIAGNOSIS — D72829 Elevated white blood cell count, unspecified: Secondary | ICD-10-CM | POA: Insufficient documentation

## 2024-04-12 NOTE — Assessment & Plan Note (Signed)
 Recent labs showed TSAT of 17 and ferritin of 303  - Labs consistent with thrombocytosis likely secondary to iron  deficiency - Start taking oral iron  every other day.  Use MiraLAX  for constipation - Will schedule for IV iron  for symptomatic relief

## 2024-04-12 NOTE — Assessment & Plan Note (Signed)
 Likely secondary to recent infection and pain (from rib fracture)  - Continue to monitor for now - Will consider further workup including flow cytometry if with unresolved

## 2024-04-12 NOTE — Assessment & Plan Note (Addendum)
 Patient has multiple likely bone lesions with normal PSA and SPEP Bone biopsies x 2 negative for malignancy Patient had multiple admissions for pneumonia and has multiple rib fractures at this time  - Patient has a scheduled CT scan on 06/24/2024 - Will repeat labs including SPEP and immunoglobulins at this time  Return to clinic in November for follow-up.  If lesions are worsening, can consider rebiopsy.  If fractures continue to happen, can consider a bone modifying agent

## 2024-04-12 NOTE — Assessment & Plan Note (Addendum)
 Patient has recent worsening of hyponatremia.  Asymptomatic  - Recommended close follow-up with his nephrologist

## 2024-04-14 ENCOUNTER — Encounter (HOSPITAL_COMMUNITY): Payer: Self-pay

## 2024-04-14 ENCOUNTER — Emergency Department (HOSPITAL_COMMUNITY)

## 2024-04-14 ENCOUNTER — Emergency Department (HOSPITAL_COMMUNITY): Admission: EM | Admit: 2024-04-14 | Discharge: 2024-04-14 | Disposition: A | Discharge status: Home / Self Care

## 2024-04-14 ENCOUNTER — Other Ambulatory Visit: Payer: Self-pay

## 2024-04-14 DIAGNOSIS — R059 Cough, unspecified: Secondary | ICD-10-CM | POA: Insufficient documentation

## 2024-04-14 DIAGNOSIS — W19XXXA Unspecified fall, initial encounter: Secondary | ICD-10-CM | POA: Insufficient documentation

## 2024-04-14 DIAGNOSIS — D72829 Elevated white blood cell count, unspecified: Secondary | ICD-10-CM | POA: Diagnosis not present

## 2024-04-14 DIAGNOSIS — R739 Hyperglycemia, unspecified: Secondary | ICD-10-CM | POA: Diagnosis not present

## 2024-04-14 DIAGNOSIS — Z794 Long term (current) use of insulin: Secondary | ICD-10-CM | POA: Insufficient documentation

## 2024-04-14 DIAGNOSIS — N179 Acute kidney failure, unspecified: Secondary | ICD-10-CM | POA: Diagnosis not present

## 2024-04-14 DIAGNOSIS — Z7902 Long term (current) use of antithrombotics/antiplatelets: Secondary | ICD-10-CM | POA: Diagnosis not present

## 2024-04-14 DIAGNOSIS — R0781 Pleurodynia: Secondary | ICD-10-CM | POA: Insufficient documentation

## 2024-04-14 DIAGNOSIS — Z7982 Long term (current) use of aspirin: Secondary | ICD-10-CM | POA: Diagnosis not present

## 2024-04-14 LAB — CBC WITH DIFFERENTIAL/PLATELET
Abs Immature Granulocytes: 0.29 K/uL — ABNORMAL HIGH (ref 0.00–0.07)
Basophils Absolute: 0.1 K/uL (ref 0.0–0.1)
Basophils Relative: 1 %
Eosinophils Absolute: 0.2 K/uL (ref 0.0–0.5)
Eosinophils Relative: 2 %
HCT: 28 % — ABNORMAL LOW (ref 39.0–52.0)
Hemoglobin: 8.6 g/dL — ABNORMAL LOW (ref 13.0–17.0)
Immature Granulocytes: 2 %
Lymphocytes Relative: 6 %
Lymphs Abs: 0.7 K/uL (ref 0.7–4.0)
MCH: 24.7 pg — ABNORMAL LOW (ref 26.0–34.0)
MCHC: 30.7 g/dL (ref 30.0–36.0)
MCV: 80.5 fL (ref 80.0–100.0)
Monocytes Absolute: 1.4 K/uL — ABNORMAL HIGH (ref 0.1–1.0)
Monocytes Relative: 11 %
Neutro Abs: 9.7 K/uL — ABNORMAL HIGH (ref 1.7–7.7)
Neutrophils Relative %: 78 %
Platelets: 471 K/uL — ABNORMAL HIGH (ref 150–400)
RBC: 3.48 MIL/uL — ABNORMAL LOW (ref 4.22–5.81)
RDW: 19.6 % — ABNORMAL HIGH (ref 11.5–15.5)
WBC: 12.4 K/uL — ABNORMAL HIGH (ref 4.0–10.5)
nRBC: 0 % (ref 0.0–0.2)

## 2024-04-14 LAB — COMPREHENSIVE METABOLIC PANEL WITH GFR
ALT: 11 U/L (ref 0–44)
AST: 12 U/L — ABNORMAL LOW (ref 15–41)
Albumin: 2.8 g/dL — ABNORMAL LOW (ref 3.5–5.0)
Alkaline Phosphatase: 122 U/L (ref 38–126)
Anion gap: 12 (ref 5–15)
BUN: 47 mg/dL — ABNORMAL HIGH (ref 8–23)
CO2: 21 mmol/L — ABNORMAL LOW (ref 22–32)
Calcium: 8.6 mg/dL — ABNORMAL LOW (ref 8.9–10.3)
Chloride: 101 mmol/L (ref 98–111)
Creatinine, Ser: 2.83 mg/dL — ABNORMAL HIGH (ref 0.61–1.24)
GFR, Estimated: 22 mL/min — ABNORMAL LOW (ref >60)
Glucose, Bld: 384 mg/dL — ABNORMAL HIGH (ref 70–99)
Potassium: 4.5 mmol/L (ref 3.5–5.1)
Sodium: 134 mmol/L — ABNORMAL LOW (ref 135–145)
Total Bilirubin: 0.6 mg/dL (ref 0.0–1.2)
Total Protein: 6.9 g/dL (ref 6.5–8.1)

## 2024-04-14 LAB — RESP PANEL BY RT-PCR (RSV, FLU A&B, COVID)  RVPGX2
Influenza A by PCR: NEGATIVE
Influenza B by PCR: NEGATIVE
Resp Syncytial Virus by PCR: NEGATIVE
SARS Coronavirus 2 by RT PCR: NEGATIVE

## 2024-04-14 MED ORDER — SODIUM CHLORIDE 0.9 % IV BOLUS
500.0000 mL | Freq: Once | INTRAVENOUS | Status: AC
  Administered 2024-04-14: 500 mL via INTRAVENOUS

## 2024-04-14 NOTE — ED Triage Notes (Signed)
 Pt states that he cracked his ribs x2 months ago and they have not healed. Pt has had cough since and increased weakness x last few days. Pt states that he has had pneumonia frequently.

## 2024-04-14 NOTE — Discharge Instructions (Addendum)
 Have repeat labs in the next week to make sure your creatinine continues to improve.  Please make sure you drink plenty of fluids. You should have this repeated on Wednesday before your procedure on Thursday   For pain, you can take 1000 mg of Tylenol  or 1 g of Tylenol  every 6-8 hours.  Do not exceed more than 4000 mg or 4 g in a 24-hour period.  Also called in lidocaine  patches that he can place on the right ribs.  If you have any kind of fever or chills then please come at the ED immediately.  This would be concerning for pneumonia.  There is no indication for pneumonia at this time.  Please use your insulin  as prescribed. Your glucose is elevated.

## 2024-04-14 NOTE — ED Provider Notes (Signed)
 [facility] AT [facility] Provider Note   CSN: [id number] Arrival date & time: 04/14/24  8065     Patient presents with: No chief complaint on file.   [name] is a 78 y.o. male.   HPI     Presents because of fatigue.  Patient states that he has been having ongoing right-sided rib pain from a fall that he had back in August.  Patient states that he has pain whenever he takes a deep breath and moves in certain directions on this right side of his ribs.  Endorses fatigue.  No fever at home.  No chills.  No chest pain or shortness of breath except whenever he starts coughing he starts having some shortness of breath because of the pain on the right side.  Has not been taking thing for the pain other than occasional Tylenol .  No hemoptysis.  No dark sputum.  No recent sick contacts.  Previous medical history reviewed : Patient was last admitted in August 2025.  Was admitted because of pneumonia.   Prior to Admission medications   Medication Sig Start Date End Date Taking? Authorizing Provider  amLODipine  (NORVASC ) 10 MG tablet Take 10 mg by mouth in the morning.    [provider]  aspirin  EC 81 MG tablet Take 1 tablet (81 mg total) by mouth daily with breakfast. 05/23/19   Emokpae, Courage, MD  atorvastatin  (LIPITOR ) 40 MG tablet Take 40 mg by mouth at bedtime. 02/07/24   [provider]  cilostazol  (PLETAL ) 100 MG tablet Take 100 mg by mouth 2 (two) times daily. 02/07/24   [provider]  clopidogrel  (PLAVIX ) 75 MG tablet Take 75 mg by mouth in the morning.    [provider]  guaiFENesin  (MUCINEX ) 600 MG 12 hr tablet Take 1 tablet (600 mg total) by mouth 2 (two) times daily. 03/23/24 03/23/25  Pearlean Manus, MD  insulin  glargine (LANTUS ) 100 UNIT/ML injection Inject 0.1 mLs (10 Units total) into the skin at bedtime. Patient taking differently: Inject 20 Units into the skin at bedtime. 12/11/23   Johnson, Clanford L, MD   levofloxacin  (LEVAQUIN ) 750 MG tablet Take 1 tablet (750 mg total) by mouth daily. 03/29/24   Suzette Pac, MD  magnesium  oxide (MAG-OX) 400 MG tablet Take 400 mg by mouth 2 (two) times daily. 12/04/23   [provider]  metoprolol  succinate (TOPROL -XL) 50 MG 24 hr tablet Take 0.5 tablets (25 mg total) by mouth daily. 03/23/24   Pearlean Manus, MD  pantoprazole  (PROTONIX ) 40 MG tablet Take 1 tablet (40 mg total) by mouth daily. 03/23/24   Pearlean Manus, MD  pregabalin  (LYRICA ) 75 MG capsule Take 75 mg by mouth 2 (two) times daily. 01/22/24   [provider]  vitamin B-12 (CYANOCOBALAMIN ) 1000 MCG tablet Take 1,000 mcg by mouth in the morning.    [provider]    Allergies: Baclofen and Acarbose    Review of Systems  Constitutional:  Negative for chills and fever.  HENT:  Negative for ear pain and sore throat.   Eyes:  Negative for pain and visual disturbance.  Respiratory:  Negative for cough and shortness of breath.   Cardiovascular:  Negative for chest pain and palpitations.  Gastrointestinal:  Negative for abdominal pain and vomiting.  Genitourinary:  Negative for dysuria and hematuria.  Musculoskeletal:  Negative for arthralgias and back pain.  Skin:  Negative for color change and rash.  Neurological:  Negative for seizures and syncope.  All other systems reviewed and are negative.   Updated Vital Signs BP (!) 165/65 (BP Location: Right Arm)   Pulse 83   Temp 98.6 F (37 C) (Oral)   Resp 18   SpO2 91%   Physical Exam Vitals and nursing note reviewed.  Constitutional:      General: He is not in acute distress.    Appearance: He is well-developed.  HENT:     Head: Normocephalic and atraumatic.  Eyes:     Conjunctiva/sclera: Conjunctivae normal.  Cardiovascular:     Rate and Rhythm: Normal rate and regular rhythm.     Heart sounds: No murmur heard. Pulmonary:     Effort: Pulmonary effort is normal. No respiratory distress.     Breath  sounds: Normal breath sounds.  Abdominal:     Palpations: Abdomen is soft.     Tenderness: There is no abdominal tenderness.  Musculoskeletal:        General: No swelling.       Arms:     Cervical back: Neck supple.  Skin:    General: Skin is warm and dry.     Capillary Refill: Capillary refill takes less than 2 seconds.  Neurological:     Mental Status: He is alert.  Psychiatric:        Mood and Affect: Mood normal.     (all labs ordered are listed, but only abnormal results are displayed) Labs Reviewed  COMPREHENSIVE METABOLIC PANEL WITH GFR - Abnormal; Notable for the following components:      Result Value   Sodium 134 (*)    CO2 21 (*)    Glucose, Bld 384 (*)    BUN 47 (*)    Creatinine, Ser 2.83 (*)    Calcium  8.6 (*)    Albumin  2.8 (*)    AST 12 (*)    GFR, Estimated 22 (*)    All other components within normal limits  CBC WITH DIFFERENTIAL/PLATELET - Abnormal; Notable for the following components:   WBC 12.4 (*)    RBC 3.48 (*)    Hemoglobin 8.6 (*)    HCT 28.0 (*)    MCH 24.7 (*)    RDW 19.6 (*)    Platelets 471 (*)    Neutro Abs 9.7 (*)    Monocytes Absolute 1.4 (*)    Abs Immature Granulocytes 0.29 (*)    All other components within normal limits  RESP PANEL BY RT-PCR (RSV, FLU A&B, COVID)  [id number]  URINALYSIS, ROUTINE W REFLEX MICROSCOPIC    EKG: None  Radiology: DG Chest 2 View Result Date: 04/14/2024 CLINICAL DATA:  Right-sided rib fracture.  Evaluate for pneumonia. EXAM: CHEST - 2 VIEW COMPARISON:  Chest x-ray 03/29/2024. FINDINGS: Aorta is ectatic with atherosclerotic calcifications. Cardiac silhouette is within normal limits. Linear opacities in the left mid and lower lungs are unchanged. There is no new focal lung infiltrate, pleural effusion or pneumothorax. There are healed/healing right fifth, sixth and seventh rib fractures. Sternotomy wires are present. IMPRESSION: 1. No acute cardiopulmonary process. 2. Healed/healing right fifth, sixth and  seventh rib fractures. Electronically Signed   By: Greig Pique M.D.   On: 04/14/2024 21:29     Procedures   Medications Ordered in the ED  sodium chloride  0.9 % bolus 500 mL (0 mLs Intravenous Stopped 04/14/24 2303)  Medical Decision Making Amount and/or Complexity of Data Reviewed Labs: ordered. Radiology: ordered.     Presents because of fatigue.  Patient states that he has been having ongoing right-sided rib pain from a fall that he had back in August.  Patient states that he has pain whenever he takes a deep breath and moves in certain directions on this right side of his ribs.  Endorses fatigue.  No fever at home.  No chills.  No chest pain or shortness of breath except whenever he starts coughing he starts having some shortness of breath because of the pain on the right side.  Has not been taking thing for the pain other than occasional Tylenol .  No hemoptysis.  No dark sputum.  No recent sick contacts.  Previous medical history reviewed : Patient was last admitted in August 2025.  Was admitted because of pneumonia.   On exam, patient on room air.  94% O2 saturation.  Febrile.  No tachypnea or tachycardia.   Labs show slight leukocytosis.  This has been patient's baseline for almost every lab draw here recently.  Leukocytosis actually downtrending in nature compared to the past couple lab draws.  Hemoglobin around patient's baseline.   Patient does have AKI.  Creatinine 2.83.  BUN elevated to 47.  Endorses decreased p.o. intake of water .  Therefore, did give 500 cc bolus of fluid.  Patient also has elevated glucose.  Encouraged appropriate insulin  use at home.  No anion gap to be concerning for DKA.  No concerns or HHS.  He has a procedure with vascular surgery on Thursday.  Do not think he needs to be admitted for this AKI but did encourage patient have repeat laboratory workup on Wednesday to make sure his creatinine continues to improve.   Instructed the patient to have aggressive p.o. intake of fluids over the next couple days to make sure his creatinine improves especially with this plan vascular procedure.   Obtain chest x-ray.  No infiltrate.  No obvious pneumonia developing at this point time.  No tachypnea or tachycardia.  Afebrile.  No indication for CT imaging.   Recommended home incentive spirometer use.  Also recommended Tylenol  as well as lidocaine  patches for ongoing right-sided rib pain.   Otherwise, patient had a completely benign abdomen.  No rebound guarding or tenderness.  No dysuria.  No concern for UTI.  No skin breakdown of concern for cellulitic changes.   Patient discharged in stable condition.    Final diagnoses:  Rib pain  AKI (acute kidney injury) (HCC)  Leukocytosis, unspecified type    ED Discharge Orders     None          Simon Lavonia SAILOR, MD 04/14/24 2304

## 2024-04-15 ENCOUNTER — Ambulatory Visit

## 2024-04-15 ENCOUNTER — Other Ambulatory Visit: Payer: Self-pay

## 2024-04-15 DIAGNOSIS — N289 Disorder of kidney and ureter, unspecified: Secondary | ICD-10-CM

## 2024-04-17 ENCOUNTER — Ambulatory Visit (HOSPITAL_COMMUNITY)
Admission: RE | Admit: 2024-04-17 | Discharge: 2024-04-17 | Disposition: A | Discharge status: Home / Self Care | Attending: Vascular Surgery | Admitting: Vascular Surgery

## 2024-04-17 ENCOUNTER — Encounter (HOSPITAL_COMMUNITY)
Admission: RE | Disposition: A | Discharge status: Home / Self Care | Payer: Self-pay | Source: Home / Self Care | Attending: Vascular Surgery

## 2024-04-17 DIAGNOSIS — N289 Disorder of kidney and ureter, unspecified: Secondary | ICD-10-CM

## 2024-04-17 DIAGNOSIS — Z7902 Long term (current) use of antithrombotics/antiplatelets: Secondary | ICD-10-CM | POA: Insufficient documentation

## 2024-04-17 DIAGNOSIS — N183 Chronic kidney disease, stage 3 unspecified: Secondary | ICD-10-CM | POA: Insufficient documentation

## 2024-04-17 DIAGNOSIS — E1122 Type 2 diabetes mellitus with diabetic chronic kidney disease: Secondary | ICD-10-CM | POA: Insufficient documentation

## 2024-04-17 DIAGNOSIS — I129 Hypertensive chronic kidney disease with stage 1 through stage 4 chronic kidney disease, or unspecified chronic kidney disease: Secondary | ICD-10-CM | POA: Diagnosis not present

## 2024-04-17 DIAGNOSIS — Z87891 Personal history of nicotine dependence: Secondary | ICD-10-CM | POA: Insufficient documentation

## 2024-04-17 DIAGNOSIS — I708 Atherosclerosis of other arteries: Secondary | ICD-10-CM | POA: Diagnosis not present

## 2024-04-17 DIAGNOSIS — T82392S Other mechanical complication of femoral arterial graft (bypass), sequela: Secondary | ICD-10-CM

## 2024-04-17 DIAGNOSIS — Z794 Long term (current) use of insulin: Secondary | ICD-10-CM | POA: Insufficient documentation

## 2024-04-17 DIAGNOSIS — Y832 Surgical operation with anastomosis, bypass or graft as the cause of abnormal reaction of the patient, or of later complication, without mention of misadventure at the time of the procedure: Secondary | ICD-10-CM | POA: Insufficient documentation

## 2024-04-17 DIAGNOSIS — T82856A Stenosis of peripheral vascular stent, initial encounter: Secondary | ICD-10-CM | POA: Diagnosis present

## 2024-04-17 DIAGNOSIS — Z79899 Other long term (current) drug therapy: Secondary | ICD-10-CM | POA: Insufficient documentation

## 2024-04-17 DIAGNOSIS — N189 Chronic kidney disease, unspecified: Secondary | ICD-10-CM | POA: Diagnosis not present

## 2024-04-17 DIAGNOSIS — E1140 Type 2 diabetes mellitus with diabetic neuropathy, unspecified: Secondary | ICD-10-CM | POA: Diagnosis not present

## 2024-04-17 DIAGNOSIS — Z7982 Long term (current) use of aspirin: Secondary | ICD-10-CM | POA: Diagnosis not present

## 2024-04-17 DIAGNOSIS — E1151 Type 2 diabetes mellitus with diabetic peripheral angiopathy without gangrene: Secondary | ICD-10-CM | POA: Diagnosis not present

## 2024-04-17 DIAGNOSIS — Z95828 Presence of other vascular implants and grafts: Secondary | ICD-10-CM

## 2024-04-17 HISTORY — PX: LOWER EXTREMITY INTERVENTION: CATH118252

## 2024-04-17 HISTORY — PX: ABDOMINAL AORTOGRAM: CATH118222

## 2024-04-17 HISTORY — PX: LOWER EXTREMITY ANGIOGRAPHY: CATH118251

## 2024-04-17 LAB — POCT I-STAT, CHEM 8
BUN: 37 mg/dL — ABNORMAL HIGH (ref 8–23)
Calcium, Ion: 1.19 mmol/L (ref 1.15–1.40)
Chloride: 99 mmol/L (ref 98–111)
Creatinine, Ser: 2.7 mg/dL — ABNORMAL HIGH (ref 0.61–1.24)
Glucose, Bld: 248 mg/dL — ABNORMAL HIGH (ref 70–99)
HCT: 28 % — ABNORMAL LOW (ref 39.0–52.0)
Hemoglobin: 9.5 g/dL — ABNORMAL LOW (ref 13.0–17.0)
Potassium: 4.1 mmol/L (ref 3.5–5.1)
Sodium: 134 mmol/L — ABNORMAL LOW (ref 135–145)
TCO2: 21 mmol/L — ABNORMAL LOW (ref 22–32)

## 2024-04-17 LAB — GLUCOSE, CAPILLARY: Glucose-Capillary: 182 mg/dL — ABNORMAL HIGH (ref 70–99)

## 2024-04-17 LAB — POCT ACTIVATED CLOTTING TIME
Activated Clotting Time: 222 s
Activated Clotting Time: 193 s
Activated Clotting Time: 164 s

## 2024-04-17 SURGERY — ABDOMINAL AORTOGRAM
Anesthesia: LOCAL

## 2024-04-17 MED ORDER — ONDANSETRON HCL 4 MG/2ML IJ SOLN
4.0000 mg | Freq: Four times a day (QID) | INTRAMUSCULAR | Status: DC | PRN
Start: 2024-04-17 — End: 2024-04-17

## 2024-04-17 MED ORDER — SODIUM CHLORIDE 0.9 % IV SOLN
250.0000 mL | INTRAVENOUS | Status: DC | PRN
Start: 2024-04-17 — End: 2024-04-17

## 2024-04-17 MED ORDER — OXYCODONE HCL 5 MG PO TABS: 5.0000 mg | ORAL_TABLET | ORAL | Status: DC | PRN

## 2024-04-17 MED ORDER — HEPARIN (PORCINE) IN NACL 2000-0.9 UNIT/L-% IV SOLN
INTRAVENOUS | Status: DC | PRN
Start: 2024-04-17 — End: 2024-04-17
  Administered 2024-04-17: 1000 mL

## 2024-04-17 MED ORDER — ASPIRIN 81 MG PO TBEC: 81.0000 mg | DELAYED_RELEASE_TABLET | Freq: Every day | ORAL | Status: DC

## 2024-04-17 MED ORDER — ASPIRIN 81 MG PO CHEW
CHEWABLE_TABLET | ORAL | Status: AC
  Filled 2024-04-17: qty 1

## 2024-04-17 MED ORDER — HYDRALAZINE HCL 20 MG/ML IJ SOLN
INTRAMUSCULAR | Status: AC
Start: 2024-04-17 — End: 2024-04-17
  Filled 2024-04-17: qty 1

## 2024-04-17 MED ORDER — IODIXANOL 320 MG/ML IV SOLN
INTRAVENOUS | Status: DC | PRN
  Administered 2024-04-17: 35 mL

## 2024-04-17 MED ORDER — MIDAZOLAM HCL 2 MG/2ML IJ SOLN
INTRAMUSCULAR | Status: DC | PRN
  Administered 2024-04-17: 1 mg via INTRAVENOUS

## 2024-04-17 MED ORDER — ASPIRIN 81 MG PO CHEW
CHEWABLE_TABLET | ORAL | Status: DC | PRN
  Administered 2024-04-17: 81 mg via ORAL

## 2024-04-17 MED ORDER — LABETALOL HCL 5 MG/ML IV SOLN: 10.0000 mg | INTRAVENOUS | Status: DC | PRN

## 2024-04-17 MED ORDER — CLOPIDOGREL BISULFATE 75 MG PO TABS: 75.0000 mg | ORAL_TABLET | Freq: Every morning | ORAL | 11 refills | Status: DC

## 2024-04-17 MED ORDER — MIDAZOLAM HCL 2 MG/2ML IJ SOLN
INTRAMUSCULAR | Status: AC
  Filled 2024-04-17: qty 2

## 2024-04-17 MED ORDER — SODIUM CHLORIDE 0.9 % IV SOLN: INTRAVENOUS | Status: AC

## 2024-04-17 MED ORDER — CLOPIDOGREL BISULFATE 75 MG PO TABS: 75.0000 mg | ORAL_TABLET | Freq: Every day | ORAL | Status: DC

## 2024-04-17 MED ORDER — SODIUM CHLORIDE 0.9% FLUSH: 3.0000 mL | INTRAVENOUS | Status: DC | PRN

## 2024-04-17 MED ORDER — HYDRALAZINE HCL 20 MG/ML IJ SOLN
INTRAMUSCULAR | Status: DC | PRN
  Administered 2024-04-17: 10 mg via INTRAVENOUS

## 2024-04-17 MED ORDER — CLOPIDOGREL BISULFATE 300 MG PO TABS
ORAL_TABLET | ORAL | Status: AC
  Filled 2024-04-17: qty 1

## 2024-04-17 MED ORDER — CLOPIDOGREL BISULFATE 300 MG PO TABS
ORAL_TABLET | ORAL | Status: DC | PRN
  Administered 2024-04-17: 300 mg via ORAL

## 2024-04-17 MED ORDER — FENTANYL CITRATE (PF) 100 MCG/2ML IJ SOLN
INTRAMUSCULAR | Status: AC
Start: 2024-04-17 — End: 2024-04-17
  Filled 2024-04-17: qty 2

## 2024-04-17 MED ORDER — HEPARIN SODIUM (PORCINE) 1000 UNIT/ML IJ SOLN
INTRAMUSCULAR | Status: DC | PRN
  Administered 2024-04-17: 7000 [IU] via INTRAVENOUS

## 2024-04-17 MED ORDER — ACETAMINOPHEN 325 MG PO TABS
650.0000 mg | ORAL_TABLET | ORAL | Status: DC | PRN
Start: 2024-04-17 — End: 2024-04-17

## 2024-04-17 MED ORDER — HYDRALAZINE HCL 20 MG/ML IJ SOLN: 5.0000 mg | INTRAMUSCULAR | Status: DC | PRN

## 2024-04-17 MED ORDER — LIDOCAINE HCL (PF) 1 % IJ SOLN
INTRAMUSCULAR | Status: DC | PRN
  Administered 2024-04-17: 15 mL

## 2024-04-17 MED ORDER — CLOPIDOGREL BISULFATE 75 MG PO TABS: 300.0000 mg | ORAL_TABLET | Freq: Once | ORAL | Status: DC

## 2024-04-17 MED ORDER — LIDOCAINE HCL (PF) 1 % IJ SOLN
INTRAMUSCULAR | Status: AC
  Filled 2024-04-17: qty 30

## 2024-04-17 MED ORDER — SODIUM CHLORIDE 0.9 % IV SOLN: INTRAVENOUS | Status: DC

## 2024-04-17 MED ORDER — SODIUM CHLORIDE 0.9% FLUSH: 3.0000 mL | Freq: Two times a day (BID) | INTRAVENOUS | Status: DC

## 2024-04-17 MED ORDER — FENTANYL CITRATE (PF) 100 MCG/2ML IJ SOLN
INTRAMUSCULAR | Status: DC | PRN
  Administered 2024-04-17: 25 ug via INTRAVENOUS

## 2024-04-17 MED ORDER — HEPARIN SODIUM (PORCINE) 1000 UNIT/ML IJ SOLN
INTRAMUSCULAR | Status: AC
  Filled 2024-04-17: qty 10

## 2024-04-17 SURGICAL SUPPLY — 20 items
CATH BEACON 5 .035 65 KMP TIP (CATHETERS) IMPLANT
CATH OMNI FLUSH 5F 65CM (CATHETERS) IMPLANT
COVER DOME SNAP 22 D (MISCELLANEOUS) IMPLANT
DCB RANGER 7.0X150 150 (BALLOONS) IMPLANT
GLIDEWIRE ADV .035X260CM (WIRE) IMPLANT
KIT ANGIASSIST CO2 SYSTEM (KITS) IMPLANT
KIT ENCORE 26 ADVANTAGE (KITS) IMPLANT
KIT MICROPUNCTURE NIT STIFF (SHEATH) IMPLANT
KIT PV (KITS) ×2 IMPLANT
KIT SINGLE USE MANIFOLD (KITS) IMPLANT
KIT SYRINGE INJ CVI SPIKEX1 (MISCELLANEOUS) IMPLANT
SET ATX-X65L (MISCELLANEOUS) IMPLANT
SHEATH PINNACLE 5F 10CM (SHEATH) IMPLANT
SHEATH PINNACLE 6F 10CM (SHEATH) IMPLANT
SHEATH PROBE COVER 6X72 (BAG) IMPLANT
SYR MEDRAD MARK 7 150ML (SYRINGE) ×2 IMPLANT
TRANSDUCER W/STOPCOCK (MISCELLANEOUS) ×2 IMPLANT
TRAY PV CATH (CUSTOM PROCEDURE TRAY) ×2 IMPLANT
WIRE BENTSON .035X145CM (WIRE) IMPLANT
WIRE G V18X300CM (WIRE) IMPLANT

## 2024-04-17 NOTE — H&P (Signed)
 Office Note        CC:  follow up Requesting Provider:  Clinic, [facility]   HPI: [name] is a 78 y.o. ([birth year]) male who presents for surveillance of PAD.  He is well-known to [facility] with an extensive surgical history including right to left femoral to femoral bypass with Dr. Dyane.  In 2017 he underwent redo right femoral artery exposure with iliofemoral endarterectomy and bovine patch angioplasty and redo limb of the femorofemoral bypass with Dr. Serene.  In 2018 he had ultrasound guided access of the left brachial with angioplasty of the right common femoral artery and stenting of the right external iliac by Dr. Serene.  In February 2023 he underwent right common iliac artery angioplasty and stenting due to recurrent iliac stenosis.  He also underwent diagnostic angiography in February 2024 due to suspected inflow threatening stenosis of the femorofemoral bypass.     He denies any claudication, rest pain, or tissue loss of bilateral lower extremities.  He is taking an aspirin , statin, Plavix  daily. He is a former smoker.  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   Hypercholesteremia     Hypertension     Neuropathy     Peripheral vascular disease (HCC)      s/p R-L FEM-FEM BYPASS   Pneumonia     Tachycardia      after CABG, pt on medicine for this               Past Surgical History:  Procedure Laterality Date   ABDOMINAL AORTOGRAM Bilateral 09/19/2016    Procedure: iliac;  Surgeon: Gaile LELON Serene, MD;  Location: [facility];  Service: Cardiovascular;   Laterality: Bilateral;   ABDOMINAL AORTOGRAM W/LOWER EXTREMITY N/A 09/22/2021    Procedure: ABDOMINAL AORTOGRAM W/LOWER EXTREMITY;  Surgeon: Gretta Lonni PARAS, MD;  Location: [facility];  Service: Cardiovascular;  Laterality: N/A;   ABDOMINAL AORTOGRAM W/LOWER EXTREMITY N/A 10/05/2022    Procedure: ABDOMINAL AORTOGRAM W/LOWER EXTREMITY;  Surgeon: Gretta Lonni PARAS, MD;  Location: [facility];  Service: Cardiovascular;  Laterality: N/A;   BIOPSY   05/16/2023    Procedure: BIOPSY;  Surgeon: Shaaron Lamar HERO, MD;  Location: [facility];  Service: Endoscopy;;   CARDIAC CATHETERIZATION   2003    with stent   CARDIAC CATHETERIZATION N/A 10/11/2015    Procedure: Left Heart Cath and Coronary Angiography;  Surgeon: Peter M Swaziland, MD;  Location: [facility] INVASIVE CV LAB;  Service: Cardiovascular;  Laterality: N/A;   COLON RESECTION N/A 12/13/2017    Procedure: EXPLORATORY LAPAROTOMY, SIGMOID COLECTOMY WITH COLOSTOMY;  Surgeon: Belinda Cough, MD;  Location: [facility] OR;  Service: General;  Laterality: N/A;   COLONOSCOPY N/A 09/22/2013    Procedure: COLONOSCOPY;  Surgeon: Lamar HERO Shaaron, MD;  [facility]: [facility];  Service: Endoscopy;  Laterality: N/A;  9:30 AM   COLONOSCOPY N/A 02/19/2018    Procedure: COLONOSCOPY;  Surgeon: Shaaron Lamar HERO, MD;  [facility];  Service: Endoscopy;  Laterality: N/A;  2:00pm   COLONOSCOPY WITH PROPOFOL  N/A 05/16/2023    Procedure: COLONOSCOPY WITH PROPOFOL ;  Surgeon: Shaaron Lamar HERO, MD;  [facility];  Service: Endoscopy;  Laterality: N/A;  730am, asa 3   COLOSTOMY N/A 12/13/2017    Procedure: COLOSTOMY;  Surgeon: Belinda Cough, MD;  Location: [facility] OR;  Service: General;  Laterality: N/A;   COLOSTOMY REVERSAL N/A 03/26/2018    Procedure: COLOSTOMY REVERSAL;  Surgeon: Belinda Cough, MD;  Location: [facility];  Service: General;  Laterality: N/A;   CORONARY ARTERY BYPASS GRAFT N/A 10/18/2015    Procedure: CORONARY ARTERY BYPASS GRAFTING  (CABG) x  five, using left internal mammary artery and right leg greater saphenous vein harvested endoscopically;  Surgeon: Elspeth JAYSON Millers, MD;  Location: [facility];  Service: Open Heart Surgery;  Laterality: N/A;   ENDARTERECTOMY FEMORAL Right 08/09/2016    Procedure: ENDARTERECTOMY FEMORAL WITH VEIN PATCH ANGIOPLASTY;  Surgeon: Gaile LELON New, MD;  Location: [facility];  Service: Vascular;  Laterality: Right;   ESOPHAGOGASTRODUODENOSCOPY N/A 10/24/2017    Dr. Shaaron: hiatal hernia   ESOPHAGOGASTRODUODENOSCOPY N/A 01/01/2024    Procedure: EGD (ESOPHAGOGASTRODUODENOSCOPY);  Surgeon: Cinderella Deatrice FALCON, MD;  [facility];  Service: Endoscopy;  Laterality: N/A;   ESOPHAGOGASTRODUODENOSCOPY (EGD) WITH PROPOFOL  N/A 05/16/2023    Procedure: ESOPHAGOGASTRODUODENOSCOPY (EGD) WITH PROPOFOL ;  Surgeon: Shaaron Lamar HERO, MD;  [facility];  Service: Endoscopy;  Laterality: N/A;   FEMORAL-FEMORAL BYPASS GRAFT Bilateral 08/09/2016    Procedure: REVISION BYPASS GRAFT RIGHT FEMORAL-LEFT FEMORAL ARTERY;  Surgeon: Gaile LELON New, MD;  Location: [facility] OR;  Service: Vascular;  Laterality: Bilateral;   FEMORAL-POPLITEAL BYPASS GRAFT       INTRAMEDULLARY (IM) NAIL INTERTROCHANTERIC Left 04/03/2021    Procedure: LEFT  HIP INTRAMEDULLARY (IM) NAIL INTERTROCHANTRIC;  Surgeon: Vernetta Lonni GRADE, MD;  Location: [facility];  Service: Orthopedics;  Laterality: Left;   IR FLUORO GUIDED NEEDLE PLC ASPIRATION/INJECTION LOC   12/26/2023   IR IVC FILTER RETRIEVAL / S&I PORTER GUID/MOD SED   01/15/2024   MALONEY DILATION N/A 05/16/2023    Procedure: AGAPITO DILATION;  Surgeon: Shaaron Lamar HERO, MD;  [facility];  Service: Endoscopy;  Laterality: N/A;   PERIPHERAL VASCULAR BALLOON ANGIOPLASTY Right 09/22/2021    Procedure: PERIPHERAL VASCULAR BALLOON ANGIOPLASTY;  Surgeon: Gretta Lonni PARAS, MD;  [facility];  Service: Cardiovascular;  Laterality: Right;   PERIPHERAL VASCULAR  CATHETERIZATION N/A 05/08/2016    Procedure: Lower Extremity Angiography;  Surgeon: Dorn PARAS Lesches, MD;  Location: [facility] INVASIVE CV LAB;  Service: Cardiovascular;  Laterality: N/A;   PERIPHERAL VASCULAR INTERVENTION Right 09/19/2016    Procedure: Peripheral Vascular Intervention;  Surgeon: Gaile LELON New, MD;  [facility];  Service: Cardiovascular;  Laterality: Right;  ext iliac stent   PERIPHERAL VASCULAR INTERVENTION Right 09/22/2021    Procedure: PERIPHERAL VASCULAR INTERVENTION;  Surgeon: Gretta Lonni PARAS, MD;  [facility];  Service: Cardiovascular;  Laterality: Right;   POLYPECTOMY   02/19/2018    Procedure: POLYPECTOMY;  Surgeon: Shaaron Lamar HERO, MD;  [facility];  Service: Endoscopy;;   PROCTOSCOPY   10/24/2017    Procedure: PROCTOSCOPY;  Surgeon: Shaaron Lamar HERO, MD;  [facility];  Service: Endoscopy;;   TEE WITHOUT CARDIOVERSION N/A 10/18/2015    Procedure: TRANSESOPHAGEAL ECHOCARDIOGRAM (TEE);  Surgeon: Elspeth JAYSON Millers, MD;  Location: [facility] OR;  Service:  Open Heart Surgery;  Laterality: N/A;          Social History         Socioeconomic History   Marital status: Married      Spouse name: Not on file   Number of children: Not on file   Years of education: Not on file   Highest education level: Not on file  Occupational History   Not on file  Tobacco Use   Smoking status: Former      Current packs/day: 0.00      Average packs/day: 2.0 packs/day for 30.0 years (60.0 ttl pk-yrs)      Types: Cigarettes      Start date: 08/14/1970      Quit date: 08/14/2000      Years since quitting: 23.5   Smokeless tobacco: Never  Vaping Use   Vaping status: Never Used  Substance and Sexual Activity   Alcohol  use: No   Drug use: No   Sexual activity: Not Currently  Other Topics Concern   Not on file  Social History Narrative   Not on file    Social Drivers of Health        Financial Resource Strain: Low Risk   (10/08/2023)    Received from [facility]    Overall Financial Resource Strain (CARDIA)     Difficulty of Paying Living Expenses: Not very hard  Food Insecurity: No Food Insecurity (01/28/2024)    Hunger Vital Sign     Worried About Running Out of Food in the Last Year: Never true     Ran Out of Food in the Last Year: Never true  Recent Concern: Food Insecurity - Food Insecurity Present (12/18/2023)    Hunger Vital Sign     Worried About Running Out of Food in the Last Year: Sometimes true     Ran Out of Food in the Last Year: Sometimes true  Transportation Needs: No Transportation Needs (01/28/2024)    PRAPARE - Therapist, art (Medical): No     Lack of Transportation (Non-Medical): No  Physical Activity: Not on file  Stress: Not on file  Social Connections: Socially Integrated (01/28/2024)    Social Connection and Isolation Panel     Frequency of Communication with Friends and Family: More than three times a week     Frequency of Social Gatherings with Friends and Family: Three times a week     Attends Religious Services: More than 4 times per year     Active Member of Clubs or Organizations: Yes     Attends Banker Meetings: 1 to 4 times per year     Marital Status: Married  Catering manager Violence: Not At Risk (01/28/2024)    Humiliation, Afraid, Rape, and Kick questionnaire     Fear of Current or Ex-Partner: No     Emotionally Abused: No     Physically Abused: No     Sexually Abused: No           Family History  Problem Relation Age of Onset   Heart attack Mother     Stroke Mother     Colon cancer Brother          late 47s                Current Outpatient Medications  Medication Sig Dispense Refill   albuterol  (VENTOLIN  HFA) 108 (90 Base) MCG/ACT inhaler Inhale 2 puffs into the lungs every 6 (  six) hours as needed for wheezing or shortness of breath. 8 g 2   amLODipine  (NORVASC ) 10 MG tablet Take 10 mg by mouth in the  morning.       aspirin  EC 81 MG tablet Take 1 tablet (81 mg total) by mouth daily with breakfast. 30 tablet 2   clopidogrel  (PLAVIX ) 75 MG tablet Take 75 mg by mouth in the morning.       Fluticasone-Umeclidin-Vilant (TRELEGY ELLIPTA) 200-62.5-25 MCG/ACT AEPB Inhale 2 puffs into the lungs daily.       gabapentin  (NEURONTIN ) 300 MG capsule Take 1 capsule (300 mg total) by mouth 2 (two) times daily. 60 capsule 0   insulin  glargine (LANTUS ) 100 UNIT/ML injection Inject 0.1 mLs (10 Units total) into the skin at bedtime. (Patient taking differently: Inject 20 Units into the skin at bedtime.)       losartan  (COZAAR ) 50 MG tablet Take 50 mg by mouth daily.       metoprolol  tartrate (LOPRESSOR ) 50 MG tablet Take 25 mg by mouth 2 (two) times daily.        naproxen (NAPROSYN) 500 MG tablet Take 500 mg by mouth 2 (two) times daily.       pantoprazole  (PROTONIX ) 40 MG tablet Take 1 tablet (40 mg total) by mouth 2 (two) times daily. 60 tablet 0   vitamin B-12 (CYANOCOBALAMIN ) 1000 MCG tablet Take 1,000 mcg by mouth in the morning.          No current facility-administered medications for this visit.        Allergies       Allergies  Allergen Reactions   Baclofen Other (See Comments)      Altered mental status, extreme drowsiness   Acarbose Diarrhea          REVIEW OF SYSTEMS:    [X]  denotes positive finding, [ ]  denotes negative finding Cardiac   Comments:  Chest pain or chest pressure:      Shortness of breath upon exertion:      Short of breath when lying flat:      Irregular heart rhythm:             Vascular      Pain in calf, thigh, or hip brought on by ambulation:      Pain in feet at night that wakes you up from your sleep:       Blood clot in your veins:      Leg swelling:              Pulmonary      Oxygen at home:      Productive cough:       Wheezing:              Neurologic      Sudden weakness in arms or legs:       Sudden numbness in arms or legs:       Sudden  onset of difficulty speaking or slurred speech:      Temporary loss of vision in one eye:       Problems with dizziness:              Gastrointestinal      Blood in stool:       Vomited blood:              Genitourinary      Burning when urinating:       Blood in urine:  Psychiatric      Major depression:              Hematologic      Bleeding problems:      Problems with blood clotting too easily:             Skin      Rashes or ulcers:             Constitutional      Fever or chills:          PHYSICAL EXAMINATION:      Vitals:    02/19/24 1337  BP: (!) 143/77  Pulse: 99  SpO2: 96%  Weight: 138 lb (62.6 kg)  Height: 5' 7 (1.702 m)      General:  WDWN in NAD; vital signs documented above Gait: Not observed HENT: WNL, normocephalic Pulmonary: normal non-labored breathing  Cardiac: regular HR Abdomen: soft, NT, no masses Skin: without rashes Vascular Exam/Pulses: palpable femoral pulses Extremities: without ischemic changes, without Gangrene , without cellulitis; without open wounds;  Musculoskeletal: no muscle wasting or atrophy       Neurologic: A&O X 3 Psychiatric:  The pt has Normal affect.     Non-Invasive Vascular Imaging:   Velocity of 508 cm/s and proximal right external iliac artery Velocities in the mid 300s in the mid and distal external iliac artery   316 cm/s at the outflow of the right to left femorofemoral bypass   ABI/TBIToday's ABIToday's TBIPrevious ABIPrevious TBI  +-------+-----------+-----------+------------+------------+  Right 0.7        0.28       0.71        0.39          +-------+-----------+-----------+------------+------------+  Left  [place]         0.18                 0.3           +-------+-----------+-----------+------------+------------+        ASSESSMENT/PLAN:: 78 y.o. male here for follow up for surveillance of PAD with history of numerous right iliac endovascular intervention and history  of right to left femoral-femoral bypass   Mr. Hackman is a 78 year old male well-known to [facility] with numerous bilateral lower extremity interventions over the years including right iliac endovascular interventions as well as right to left femorofemoral bypass.  Most recent aortogram in February 2024 was negative for any flow-limiting inflow stenosis to the femorofemoral bypass.  Duplex however shows an increase in velocities of the right common and external iliac artery suggesting a threatened femorofemoral bypass.  Most recent BMP demonstrates a creatinine of 1.5.  Plan will be to proceed with aortogram with possible intervention of the femorofemoral bypass and right iliac system with Dr. Gretta in the near future.  This may involve CO2 contrast to limit risk of kidney injury.  He will continue his Plavix  and aspirin  perioperatively.  Angiogram was discussed in detail with the patient and his wife and they are agreeable to proceed.    Lonni DOROTHA Gretta, MD [facility] Office: [phone]   Lonni JINNY Gretta

## 2024-04-17 NOTE — Op Note (Signed)
    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  Ultrasound-guided access right to left femorofemoral bypass retrograde 2.  Aortogram with catheter selection of aorta 3.  Drug-coated balloon angioplasty right common and external iliac artery stents (7 mm x 150 mm drug-coated Ranger) 4.  34 minutes of monitored moderate conscious sedation time  Indications: 78 year old male who previously underwent a right to left femoral-femoral bypass by Dr. Dyane years ago.  He had multiple stenting procedures of his right common and external iliac artery.  Recent duplex showed increasing stenosis in the right iliac stents with velocities near 500 suggesting high-grade stenosis.  He presents for lower extremity angiogram with a focus on the femorofemoral bypass and right iliac stents which is the inflow after risks benefits discussed.  Findings:   Ultrasound-guided access right to left femorofemoral bypass retrograde.  Tried to use CO2 but could not get any good imaging.  Angiogram ultimately showed diffuse disease throughout the right common and external iliac stents with what look like at least 50% stenosis in the proximal external iliac artery near the hypogastric.  Ultimately elected to treat all this with 7 mm x 150 mm drug-coated Ranger for prolonged inflation of 3 minutes.  Widely patent stents at completion.  Patent femorofemoral bypass.  Profunda runoff bilaterally with occluded SFAs.   Procedure:  The patient was identified in the holding area and taken to room 8.  The patient was then placed supine on the table and prepped and draped in the usual sterile fashion.  A time out was called.  Patient received Versed  and fentanyl  for conscious moderate sedation.   Vital signs are monitored including heart rate, respiratory rate, oxygenation blood pressure.  I was present for all of moderate sedation.  Ultrasound was used to evaluate the femoral femoral bypasss, it was patent, an image was saved.  This was accessed with a micro access needle placed a microwire and micro sheath.  I then used a Glidewire advantage and upsized to a short 5 French sheath in the femoral femoral bypass.  The patient was given 100 units/kg IV heparin .  I then used a KMP catheter to drive the Glidewire advantage retrograde through the right iliac stents and exchanged for an Omni Flush catheter in the abdominal aorta.  Initially tried to use CO2 due to chronic kidney disease.  Due to bowel gas there was no visualization of the iliac stents.  Ultimately elected for contrast injection.  Ultimately as noted above we elected for drug-coated balloon angioplasty and exchanged for a V18 wire up into the aorta through the right iliac stents.  I then used a 7 mm x 150 mm drug-coated Ranger inflated to nominal pressure for 3 minutes.  Final imaging showed widely patent stents.  I did get an additional image of the left groin to look at the outflow of the femorofemoral bypass.  He has profunda runoff bilaterally.  Wires and catheters were removed.  Will be taken to holding for sheath removal.  Plan: Excellent results after right iliac artery drug-coated balloon angioplasty.  Plan aspirin  statin Plavix .  Arrange follow-up in 1 month.     Lonni DOROTHA Gaskins, MD [facility] Office: [phone]

## 2024-04-17 NOTE — Progress Notes (Signed)
 Patient and wife was given discharge instructions. Both verbalized understanding.

## 2024-04-18 ENCOUNTER — Encounter (HOSPITAL_COMMUNITY): Payer: Self-pay | Admitting: Vascular Surgery

## 2024-04-18 ENCOUNTER — Other Ambulatory Visit: Payer: Self-pay | Admitting: *Deleted

## 2024-04-18 DIAGNOSIS — I771 Stricture of artery: Secondary | ICD-10-CM

## 2024-04-18 DIAGNOSIS — I739 Peripheral vascular disease, unspecified: Secondary | ICD-10-CM

## 2024-04-21 ENCOUNTER — Ambulatory Visit: Admitting: Internal Medicine

## 2024-04-22 ENCOUNTER — Telehealth: Payer: Self-pay

## 2024-04-22 ENCOUNTER — Inpatient Hospital Stay: Attending: Hematology

## 2024-04-22 NOTE — Telephone Encounter (Addendum)
 [name] Gwenn the patient's daughter called to report that the patient has been experiencing confusion in the morning and takes several hours to start making sense. She was concerned that this could be R/T the contrast used in his procedure on 04/17/2024.  Per Dr. Gretta Contrast should not cause confusion and advised that [name] have the patient follow up with PCP.  [name] verbalized understanding of above advise.

## 2024-04-24 ENCOUNTER — Inpatient Hospital Stay

## 2024-04-25 ENCOUNTER — Inpatient Hospital Stay: Attending: Hematology

## 2024-04-25 VITALS — BP 131/47 | HR 65 | Temp 97.8°F | Resp 18

## 2024-04-25 DIAGNOSIS — N184 Chronic kidney disease, stage 4 (severe): Secondary | ICD-10-CM

## 2024-04-25 DIAGNOSIS — D508 Other iron deficiency anemias: Secondary | ICD-10-CM | POA: Diagnosis present

## 2024-04-25 MED ORDER — SODIUM CHLORIDE 0.9 % IV SOLN
1000.0000 mg | Freq: Once | INTRAVENOUS | Status: AC
  Administered 2024-04-25: 1000 mg via INTRAVENOUS
  Filled 2024-04-25: qty 1000

## 2024-04-25 MED ORDER — ACETAMINOPHEN 325 MG PO TABS
650.0000 mg | ORAL_TABLET | Freq: Once | ORAL | Status: AC
  Administered 2024-04-25: 650 mg via ORAL
  Filled 2024-04-25: qty 2

## 2024-04-25 MED ORDER — CETIRIZINE HCL 10 MG/ML IV SOLN
10.0000 mg | Freq: Once | INTRAVENOUS | Status: AC
  Administered 2024-04-25: 10 mg via INTRAVENOUS
  Filled 2024-04-25: qty 1

## 2024-04-25 MED ORDER — SODIUM CHLORIDE 0.9 % IV SOLN: INTRAVENOUS | Status: DC

## 2024-04-25 NOTE — Progress Notes (Signed)
 Patient tolerated iron infusion with no complaints voiced.  Peripheral IV site clean and dry with good blood return noted before and after infusion.  Band aid applied.  VSS with discharge and left in satisfactory condition with no s/s of distress noted.

## 2024-04-25 NOTE — Patient Instructions (Signed)
 [facility] - A DEPT OF [facility]  Discharge Instructions: Thank you for choosing [facility] to provide your oncology and hematology care.  If you have a lab appointment with the Cancer Center - please note that after April 8th, 2024, all labs will be drawn in the cancer center.  You do not have to check in or register with the main entrance as you have in the past but will complete your check-in in the cancer center.  Wear comfortable clothing and clothing appropriate for easy access to any Portacath or PICC line.   We strive to give you quality time with your provider. You may need to reschedule your appointment if you arrive late (15 or more minutes).  Arriving late affects you and other patients whose appointments are after yours.  Also, if you miss three or more appointments without notifying the office, you may be dismissed from the clinic at the provider's discretion.      For prescription refill requests, have your pharmacy contact our office and allow 72 hours for refills to be completed.    Today you received the following Monoferric , return as scheduled.   To help prevent nausea and vomiting after your treatment, we encourage you to take your nausea medication as directed.  BELOW ARE SYMPTOMS THAT SHOULD BE REPORTED IMMEDIATELY: *FEVER GREATER THAN 100.4 F (38 C) OR HIGHER *CHILLS OR SWEATING *NAUSEA AND VOMITING THAT IS NOT CONTROLLED WITH YOUR NAUSEA MEDICATION *UNUSUAL SHORTNESS OF BREATH *UNUSUAL BRUISING OR BLEEDING *URINARY PROBLEMS (pain or burning when urinating, or frequent urination) *BOWEL PROBLEMS (unusual diarrhea, constipation, pain near the anus) TENDERNESS IN MOUTH AND THROAT WITH OR WITHOUT PRESENCE OF ULCERS (sore throat, sores in mouth, or a toothache) UNUSUAL RASH, SWELLING OR PAIN  UNUSUAL VAGINAL DISCHARGE OR ITCHING   Items with * indicate a potential emergency and should be followed up as soon as possible  or go to the Emergency Department if any problems should occur.  Please show the CHEMOTHERAPY ALERT CARD or IMMUNOTHERAPY ALERT CARD at check-in to the Emergency Department and triage nurse.  Should you have questions after your visit or need to cancel or reschedule your appointment, please contact [facility] [place] - A DEPT OF [facility] [phone]  and follow the prompts.  Office hours are 8:00 a.m. to 4:30 p.m. Monday - Friday. Please note that voicemails left after 4:00 p.m. may not be returned until the following business day.  We are closed weekends and major holidays. You have access to a nurse at all times for urgent questions. Please call the main number to the clinic [phone] and follow the prompts.  For any non-urgent questions, you may also contact your provider using MyChart. We now offer e-Visits for anyone 68 and older to request care online for non-urgent symptoms. For details visit [email or web address].   Also download the MyChart app! Go to the app store, search MyChart, open the app, select [facility], and log in with your MyChart username and password.

## 2024-05-13 ENCOUNTER — Encounter (HOSPITAL_COMMUNITY): Payer: Self-pay

## 2024-05-13 ENCOUNTER — Emergency Department (HOSPITAL_COMMUNITY)

## 2024-05-13 ENCOUNTER — Ambulatory Visit (INDEPENDENT_AMBULATORY_CARE_PROVIDER_SITE_OTHER)

## 2024-05-13 ENCOUNTER — Other Ambulatory Visit: Payer: Self-pay

## 2024-05-13 ENCOUNTER — Ambulatory Visit: Admitting: Physician Assistant

## 2024-05-13 ENCOUNTER — Ambulatory Visit

## 2024-05-13 ENCOUNTER — Inpatient Hospital Stay (HOSPITAL_COMMUNITY): Admission: EM | Admit: 2024-05-13 | Discharge: 2024-06-14 | DRG: 682 | Disposition: E | Source: Ambulatory Visit

## 2024-05-13 VITALS — BP 151/78 | HR 80 | Ht 67.0 in | Wt 134.0 lb

## 2024-05-13 DIAGNOSIS — Z7189 Other specified counseling: Secondary | ICD-10-CM | POA: Diagnosis not present

## 2024-05-13 DIAGNOSIS — R569 Unspecified convulsions: Secondary | ICD-10-CM | POA: Diagnosis not present

## 2024-05-13 DIAGNOSIS — Z95828 Presence of other vascular implants and grafts: Secondary | ICD-10-CM

## 2024-05-13 DIAGNOSIS — E860 Dehydration: Secondary | ICD-10-CM | POA: Diagnosis present

## 2024-05-13 DIAGNOSIS — J95851 Ventilator associated pneumonia: Secondary | ICD-10-CM | POA: Diagnosis not present

## 2024-05-13 DIAGNOSIS — E782 Mixed hyperlipidemia: Secondary | ICD-10-CM | POA: Diagnosis present

## 2024-05-13 DIAGNOSIS — K922 Gastrointestinal hemorrhage, unspecified: Secondary | ICD-10-CM | POA: Diagnosis not present

## 2024-05-13 DIAGNOSIS — J9601 Acute respiratory failure with hypoxia: Secondary | ICD-10-CM | POA: Diagnosis not present

## 2024-05-13 DIAGNOSIS — K2091 Esophagitis, unspecified with bleeding: Secondary | ICD-10-CM | POA: Diagnosis not present

## 2024-05-13 DIAGNOSIS — Z66 Do not resuscitate: Secondary | ICD-10-CM | POA: Diagnosis present

## 2024-05-13 DIAGNOSIS — R262 Difficulty in walking, not elsewhere classified: Secondary | ICD-10-CM | POA: Diagnosis present

## 2024-05-13 DIAGNOSIS — Z794 Long term (current) use of insulin: Secondary | ICD-10-CM | POA: Diagnosis not present

## 2024-05-13 DIAGNOSIS — I739 Peripheral vascular disease, unspecified: Secondary | ICD-10-CM

## 2024-05-13 DIAGNOSIS — I2510 Atherosclerotic heart disease of native coronary artery without angina pectoris: Secondary | ICD-10-CM | POA: Diagnosis present

## 2024-05-13 DIAGNOSIS — Z8249 Family history of ischemic heart disease and other diseases of the circulatory system: Secondary | ICD-10-CM

## 2024-05-13 DIAGNOSIS — E1122 Type 2 diabetes mellitus with diabetic chronic kidney disease: Secondary | ICD-10-CM | POA: Diagnosis present

## 2024-05-13 DIAGNOSIS — M79661 Pain in right lower leg: Secondary | ICD-10-CM | POA: Diagnosis not present

## 2024-05-13 DIAGNOSIS — G9340 Encephalopathy, unspecified: Secondary | ICD-10-CM | POA: Diagnosis present

## 2024-05-13 DIAGNOSIS — I472 Ventricular tachycardia, unspecified: Secondary | ICD-10-CM | POA: Diagnosis not present

## 2024-05-13 DIAGNOSIS — Z7902 Long term (current) use of antithrombotics/antiplatelets: Secondary | ICD-10-CM | POA: Diagnosis not present

## 2024-05-13 DIAGNOSIS — E43 Unspecified severe protein-calorie malnutrition: Secondary | ICD-10-CM | POA: Diagnosis present

## 2024-05-13 DIAGNOSIS — I2489 Other forms of acute ischemic heart disease: Secondary | ICD-10-CM | POA: Diagnosis present

## 2024-05-13 DIAGNOSIS — Z955 Presence of coronary angioplasty implant and graft: Secondary | ICD-10-CM

## 2024-05-13 DIAGNOSIS — I469 Cardiac arrest, cause unspecified: Secondary | ICD-10-CM

## 2024-05-13 DIAGNOSIS — Z9049 Acquired absence of other specified parts of digestive tract: Secondary | ICD-10-CM

## 2024-05-13 DIAGNOSIS — R0902 Hypoxemia: Secondary | ICD-10-CM | POA: Diagnosis not present

## 2024-05-13 DIAGNOSIS — G928 Other toxic encephalopathy: Secondary | ICD-10-CM | POA: Diagnosis present

## 2024-05-13 DIAGNOSIS — G929 Unspecified toxic encephalopathy: Secondary | ICD-10-CM | POA: Diagnosis not present

## 2024-05-13 DIAGNOSIS — K2101 Gastro-esophageal reflux disease with esophagitis, with bleeding: Secondary | ICD-10-CM | POA: Diagnosis not present

## 2024-05-13 DIAGNOSIS — N184 Chronic kidney disease, stage 4 (severe): Secondary | ICD-10-CM | POA: Diagnosis present

## 2024-05-13 DIAGNOSIS — N390 Urinary tract infection, site not specified: Secondary | ICD-10-CM | POA: Diagnosis not present

## 2024-05-13 DIAGNOSIS — A419 Sepsis, unspecified organism: Principal | ICD-10-CM

## 2024-05-13 DIAGNOSIS — R570 Cardiogenic shock: Secondary | ICD-10-CM | POA: Diagnosis not present

## 2024-05-13 DIAGNOSIS — I771 Stricture of artery: Secondary | ICD-10-CM

## 2024-05-13 DIAGNOSIS — E871 Hypo-osmolality and hyponatremia: Secondary | ICD-10-CM | POA: Diagnosis present

## 2024-05-13 DIAGNOSIS — I468 Cardiac arrest due to other underlying condition: Secondary | ICD-10-CM | POA: Diagnosis not present

## 2024-05-13 DIAGNOSIS — I129 Hypertensive chronic kidney disease with stage 1 through stage 4 chronic kidney disease, or unspecified chronic kidney disease: Secondary | ICD-10-CM | POA: Diagnosis present

## 2024-05-13 DIAGNOSIS — I10 Essential (primary) hypertension: Secondary | ICD-10-CM | POA: Diagnosis present

## 2024-05-13 DIAGNOSIS — Z8673 Personal history of transient ischemic attack (TIA), and cerebral infarction without residual deficits: Secondary | ICD-10-CM

## 2024-05-13 DIAGNOSIS — N3000 Acute cystitis without hematuria: Secondary | ICD-10-CM

## 2024-05-13 DIAGNOSIS — E119 Type 2 diabetes mellitus without complications: Secondary | ICD-10-CM

## 2024-05-13 DIAGNOSIS — E11649 Type 2 diabetes mellitus with hypoglycemia without coma: Secondary | ICD-10-CM | POA: Diagnosis not present

## 2024-05-13 DIAGNOSIS — E1151 Type 2 diabetes mellitus with diabetic peripheral angiopathy without gangrene: Secondary | ICD-10-CM | POA: Diagnosis present

## 2024-05-13 DIAGNOSIS — K449 Diaphragmatic hernia without obstruction or gangrene: Secondary | ICD-10-CM | POA: Diagnosis present

## 2024-05-13 DIAGNOSIS — D62 Acute posthemorrhagic anemia: Secondary | ICD-10-CM | POA: Diagnosis not present

## 2024-05-13 DIAGNOSIS — R627 Adult failure to thrive: Secondary | ICD-10-CM | POA: Diagnosis present

## 2024-05-13 DIAGNOSIS — Z951 Presence of aortocoronary bypass graft: Secondary | ICD-10-CM | POA: Diagnosis not present

## 2024-05-13 DIAGNOSIS — N178 Other acute kidney failure: Secondary | ICD-10-CM | POA: Diagnosis not present

## 2024-05-13 DIAGNOSIS — E872 Acidosis, unspecified: Secondary | ICD-10-CM | POA: Diagnosis present

## 2024-05-13 DIAGNOSIS — S12500A Unspecified displaced fracture of sixth cervical vertebra, initial encounter for closed fracture: Secondary | ICD-10-CM | POA: Diagnosis present

## 2024-05-13 DIAGNOSIS — E1165 Type 2 diabetes mellitus with hyperglycemia: Secondary | ICD-10-CM | POA: Diagnosis present

## 2024-05-13 DIAGNOSIS — N3090 Cystitis, unspecified without hematuria: Secondary | ICD-10-CM | POA: Diagnosis present

## 2024-05-13 DIAGNOSIS — Z87891 Personal history of nicotine dependence: Secondary | ICD-10-CM

## 2024-05-13 DIAGNOSIS — N179 Acute kidney failure, unspecified: Secondary | ICD-10-CM | POA: Diagnosis present

## 2024-05-13 DIAGNOSIS — K1120 Sialoadenitis, unspecified: Secondary | ICD-10-CM | POA: Diagnosis not present

## 2024-05-13 DIAGNOSIS — R4182 Altered mental status, unspecified: Secondary | ICD-10-CM | POA: Diagnosis not present

## 2024-05-13 DIAGNOSIS — Z881 Allergy status to other antibiotic agents status: Secondary | ICD-10-CM

## 2024-05-13 DIAGNOSIS — K5900 Constipation, unspecified: Secondary | ICD-10-CM | POA: Diagnosis not present

## 2024-05-13 DIAGNOSIS — R410 Disorientation, unspecified: Secondary | ICD-10-CM | POA: Diagnosis not present

## 2024-05-13 DIAGNOSIS — Z6820 Body mass index (BMI) 20.0-20.9, adult: Secondary | ICD-10-CM

## 2024-05-13 DIAGNOSIS — Z781 Physical restraint status: Secondary | ICD-10-CM

## 2024-05-13 DIAGNOSIS — D631 Anemia in chronic kidney disease: Secondary | ICD-10-CM | POA: Diagnosis present

## 2024-05-13 DIAGNOSIS — K7200 Acute and subacute hepatic failure without coma: Secondary | ICD-10-CM | POA: Diagnosis present

## 2024-05-13 DIAGNOSIS — Z823 Family history of stroke: Secondary | ICD-10-CM

## 2024-05-13 DIAGNOSIS — G9341 Metabolic encephalopathy: Secondary | ICD-10-CM | POA: Diagnosis not present

## 2024-05-13 DIAGNOSIS — Z515 Encounter for palliative care: Secondary | ICD-10-CM | POA: Diagnosis not present

## 2024-05-13 DIAGNOSIS — Z79899 Other long term (current) drug therapy: Secondary | ICD-10-CM

## 2024-05-13 DIAGNOSIS — K2211 Ulcer of esophagus with bleeding: Secondary | ICD-10-CM | POA: Diagnosis not present

## 2024-05-13 DIAGNOSIS — Z888 Allergy status to other drugs, medicaments and biological substances status: Secondary | ICD-10-CM

## 2024-05-13 DIAGNOSIS — R54 Age-related physical debility: Secondary | ICD-10-CM | POA: Diagnosis present

## 2024-05-13 DIAGNOSIS — Z7982 Long term (current) use of aspirin: Secondary | ICD-10-CM

## 2024-05-13 DIAGNOSIS — Z6821 Body mass index (BMI) 21.0-21.9, adult: Secondary | ICD-10-CM

## 2024-05-13 DIAGNOSIS — E1140 Type 2 diabetes mellitus with diabetic neuropathy, unspecified: Secondary | ICD-10-CM | POA: Diagnosis present

## 2024-05-13 DIAGNOSIS — J810 Acute pulmonary edema: Secondary | ICD-10-CM | POA: Diagnosis not present

## 2024-05-13 DIAGNOSIS — Z8701 Personal history of pneumonia (recurrent): Secondary | ICD-10-CM

## 2024-05-13 LAB — URINALYSIS, ROUTINE W REFLEX MICROSCOPIC
Bilirubin Urine: NEGATIVE
Glucose, UA: >=500 mg/dL — AB
Ketones, ur: NEGATIVE mg/dL
Nitrite: NEGATIVE
Protein, ur: 30 mg/dL — AB
Specific Gravity, Urine: 1.016 (ref 1.005–1.030)
WBC, UA: >50 WBC/hpf (ref 0–5)
pH: 5 (ref 5.0–8.0)

## 2024-05-13 LAB — COMPREHENSIVE METABOLIC PANEL WITH GFR
ALT: 10 U/L (ref 0–44)
AST: 29 U/L (ref 15–41)
Albumin: 3.7 g/dL (ref 3.5–5.0)
Alkaline Phosphatase: 138 U/L — ABNORMAL HIGH (ref 38–126)
Anion gap: 20 — ABNORMAL HIGH (ref 5–15)
BUN: 64 mg/dL — ABNORMAL HIGH (ref 8–23)
CO2: 16 mmol/L — ABNORMAL LOW (ref 22–32)
Calcium: 9.4 mg/dL (ref 8.9–10.3)
Chloride: 96 mmol/L — ABNORMAL LOW (ref 98–111)
Creatinine, Ser: 5.48 mg/dL — ABNORMAL HIGH (ref 0.61–1.24)
GFR, Estimated: 10 mL/min — ABNORMAL LOW (ref >60)
Glucose, Bld: 301 mg/dL — ABNORMAL HIGH (ref 70–99)
Potassium: 4.6 mmol/L (ref 3.5–5.1)
Sodium: 132 mmol/L — ABNORMAL LOW (ref 135–145)
Total Bilirubin: 0.4 mg/dL (ref 0.0–1.2)
Total Protein: 7.8 g/dL (ref 6.5–8.1)

## 2024-05-13 LAB — TSH: TSH: 2.25 u[IU]/mL (ref 0.350–4.500)

## 2024-05-13 LAB — CBC WITH DIFFERENTIAL/PLATELET
Abs Immature Granulocytes: 0.19 K/uL — ABNORMAL HIGH (ref 0.00–0.07)
Basophils Absolute: 0.1 K/uL (ref 0.0–0.1)
Basophils Relative: 1 %
Eosinophils Absolute: 0.1 K/uL (ref 0.0–0.5)
Eosinophils Relative: 1 %
HCT: 29.2 % — ABNORMAL LOW (ref 39.0–52.0)
Hemoglobin: 9.1 g/dL — ABNORMAL LOW (ref 13.0–17.0)
Immature Granulocytes: 1 %
Lymphocytes Relative: 6 %
Lymphs Abs: 1.1 K/uL (ref 0.7–4.0)
MCH: 24.9 pg — ABNORMAL LOW (ref 26.0–34.0)
MCHC: 31.2 g/dL (ref 30.0–36.0)
MCV: 79.8 fL — ABNORMAL LOW (ref 80.0–100.0)
Monocytes Absolute: 1.8 K/uL — ABNORMAL HIGH (ref 0.1–1.0)
Monocytes Relative: 11 %
Neutro Abs: 13.5 K/uL — ABNORMAL HIGH (ref 1.7–7.7)
Neutrophils Relative %: 80 %
Platelets: 544 K/uL — ABNORMAL HIGH (ref 150–400)
RBC: 3.66 MIL/uL — ABNORMAL LOW (ref 4.22–5.81)
RDW: 19.1 % — ABNORMAL HIGH (ref 11.5–15.5)
WBC: 16.7 K/uL — ABNORMAL HIGH (ref 4.0–10.5)
nRBC: 0 % (ref 0.0–0.2)

## 2024-05-13 LAB — VAS US ABI WITH/WO TBI

## 2024-05-13 LAB — LACTIC ACID, PLASMA
Lactic Acid, Venous: 2.6 mmol/L (ref 0.5–1.9)
Lactic Acid, Venous: 1.6 mmol/L (ref 0.5–1.9)

## 2024-05-13 LAB — TROPONIN T, HIGH SENSITIVITY
Troponin T High Sensitivity: 129 ng/L (ref 0–19)
Troponin T High Sensitivity: 108 ng/L (ref 0–19)

## 2024-05-13 LAB — MAGNESIUM: Magnesium: 2.2 mg/dL (ref 1.7–2.4)

## 2024-05-13 LAB — GLUCOSE, CAPILLARY: Glucose-Capillary: 263 mg/dL — ABNORMAL HIGH (ref 70–99)

## 2024-05-13 LAB — AMMONIA: Ammonia: <13 umol/L (ref 9–35)

## 2024-05-13 MED ORDER — INSULIN GLARGINE 100 UNIT/ML ~~LOC~~ SOLN
15.0000 [IU] | Freq: Every day | SUBCUTANEOUS | Status: DC
  Administered 2024-05-13 – 2024-05-16 (×4): 15 [IU] via SUBCUTANEOUS
  Filled 2024-05-13 (×5): qty 0.15

## 2024-05-13 MED ORDER — HEPARIN SODIUM (PORCINE) 5000 UNIT/ML IJ SOLN
5000.0000 [IU] | Freq: Three times a day (TID) | INTRAMUSCULAR | Status: DC
  Administered 2024-05-13 – 2024-05-17 (×12): 5000 [IU] via SUBCUTANEOUS
  Filled 2024-05-13 (×12): qty 1

## 2024-05-13 MED ORDER — SODIUM CHLORIDE 0.9 % IV BOLUS
1000.0000 mL | Freq: Once | INTRAVENOUS | Status: AC
  Administered 2024-05-13: 1000 mL via INTRAVENOUS

## 2024-05-13 MED ORDER — LACTATED RINGERS IV BOLUS
500.0000 mL | Freq: Once | INTRAVENOUS | Status: AC
  Administered 2024-05-13: 500 mL via INTRAVENOUS

## 2024-05-13 MED ORDER — ATORVASTATIN CALCIUM 40 MG PO TABS
40.0000 mg | ORAL_TABLET | Freq: Every day | ORAL | Status: DC
  Administered 2024-05-13 – 2024-05-16 (×4): 40 mg via ORAL
  Filled 2024-05-13 (×4): qty 1

## 2024-05-13 MED ORDER — LACTATED RINGERS IV BOLUS: 1000.0000 mL | Freq: Once | INTRAVENOUS | Status: DC

## 2024-05-13 MED ORDER — INSULIN ASPART 100 UNIT/ML IJ SOLN
0.0000 [IU] | Freq: Every day | INTRAMUSCULAR | Status: DC
  Administered 2024-05-13: 3 [IU] via SUBCUTANEOUS

## 2024-05-13 MED ORDER — CILOSTAZOL 100 MG PO TABS
100.0000 mg | ORAL_TABLET | Freq: Two times a day (BID) | ORAL | Status: DC
  Administered 2024-05-13 – 2024-05-17 (×8): 100 mg via ORAL
  Filled 2024-05-13 (×10): qty 1

## 2024-05-13 MED ORDER — PROMETHAZINE HCL 12.5 MG PO TABS
12.5000 mg | ORAL_TABLET | Freq: Four times a day (QID) | ORAL | Status: DC | PRN
  Administered 2024-05-17: 12.5 mg via ORAL
  Filled 2024-05-13: qty 1

## 2024-05-13 MED ORDER — SODIUM CHLORIDE 0.9 % IV SOLN
1.0000 g | INTRAVENOUS | Status: DC
  Administered 2024-05-14 – 2024-05-15 (×2): 1 g via INTRAVENOUS
  Filled 2024-05-13 (×2): qty 10

## 2024-05-13 MED ORDER — ACETAMINOPHEN 325 MG PO TABS
650.0000 mg | ORAL_TABLET | Freq: Four times a day (QID) | ORAL | Status: DC | PRN
  Administered 2024-05-16 – 2024-05-17 (×2): 650 mg via ORAL
  Filled 2024-05-13 (×2): qty 2

## 2024-05-13 MED ORDER — GUAIFENESIN-DM 100-10 MG/5ML PO SYRP
15.0000 mL | ORAL_SOLUTION | Freq: Three times a day (TID) | ORAL | Status: AC
  Administered 2024-05-13 – 2024-05-14 (×3): 15 mL via ORAL
  Filled 2024-05-13 (×3): qty 15

## 2024-05-13 MED ORDER — SODIUM CHLORIDE 0.9 % IV SOLN
1.0000 g | Freq: Once | INTRAVENOUS | Status: AC
  Administered 2024-05-13: 1 g via INTRAVENOUS
  Filled 2024-05-13: qty 10

## 2024-05-13 MED ORDER — CLOPIDOGREL BISULFATE 75 MG PO TABS
75.0000 mg | ORAL_TABLET | Freq: Every morning | ORAL | Status: DC
  Administered 2024-05-14 – 2024-05-17 (×4): 75 mg via ORAL
  Filled 2024-05-13 (×4): qty 1

## 2024-05-13 MED ORDER — SODIUM CHLORIDE 0.9 % IV SOLN: INTRAVENOUS | Status: DC

## 2024-05-13 MED ORDER — ASPIRIN 81 MG PO TBEC
81.0000 mg | DELAYED_RELEASE_TABLET | Freq: Every day | ORAL | Status: DC
  Administered 2024-05-14 – 2024-05-17 (×4): 81 mg via ORAL
  Filled 2024-05-13 (×5): qty 1

## 2024-05-13 MED ORDER — METOPROLOL SUCCINATE ER 50 MG PO TB24
25.0000 mg | ORAL_TABLET | Freq: Every day | ORAL | Status: DC
  Administered 2024-05-13 – 2024-05-17 (×5): 25 mg via ORAL
  Filled 2024-05-13 (×6): qty 1

## 2024-05-13 MED ORDER — ACETAMINOPHEN 650 MG RE SUPP: 650.0000 mg | Freq: Four times a day (QID) | RECTAL | Status: DC | PRN

## 2024-05-13 MED ORDER — INSULIN ASPART 100 UNIT/ML IJ SOLN
0.0000 [IU] | Freq: Three times a day (TID) | INTRAMUSCULAR | Status: DC
  Administered 2024-05-14: 2 [IU] via SUBCUTANEOUS
  Administered 2024-05-15: 3 [IU] via SUBCUTANEOUS
  Administered 2024-05-18: 2 [IU] via SUBCUTANEOUS
  Administered 2024-05-18: 1 [IU] via SUBCUTANEOUS
  Administered 2024-05-18: 2 [IU] via SUBCUTANEOUS
  Administered 2024-05-19: 1 [IU] via SUBCUTANEOUS

## 2024-05-13 MED ORDER — POLYETHYLENE GLYCOL 3350 17 G PO PACK
17.0000 g | PACK | Freq: Two times a day (BID) | ORAL | Status: DC
  Administered 2024-05-13 – 2024-05-16 (×7): 17 g via ORAL
  Filled 2024-05-13 (×9): qty 1

## 2024-05-13 NOTE — ED Provider Notes (Signed)
 [place] EMERGENCY DEPARTMENT AT [facility] Provider Note   CSN: [id number] Arrival date & time: 05/13/24  1208     Patient presents with: Tremors   [name] is a 78 y.o. male.   Patient is a 78 year old male who presents to the emergency department with his wife secondary to increased weakness, new onset tremors, changes in gait which has been progressive over the past week.  He was evaluated by his vascular surgeon today who did note change in the patient and was recommended come to the emergency department.  He does admit to some intermittent shortness of breath but has had no chest pain.  He denies any abdominal pain, nausea, vomiting.  Wife notes that he has had poor p.o. intake over the past week as well.  She notes that he has been sleeping more and has been confused.  He was recently treated for pneumonia and finished his antibiotics 2 days ago.        Prior to Admission medications   Medication Sig Start Date End Date Taking? Authorizing Provider  aspirin  EC 81 MG tablet Take 1 tablet (81 mg total) by mouth daily with breakfast. 05/23/19  Yes Emokpae, Courage, MD  atorvastatin  (LIPITOR ) 40 MG tablet Take 40 mg by mouth at bedtime. 02/07/24  Yes [provider]  cilostazol  (PLETAL ) 100 MG tablet Take 100 mg by mouth 2 (two) times daily. 02/07/24  Yes [provider]  clopidogrel  (PLAVIX ) 75 MG tablet Take 1 tablet (75 mg total) by mouth in the morning. 04/17/24  Yes Gretta Lonni PARAS, MD  insulin  glargine (LANTUS ) 100 UNIT/ML injection Inject 0.1 mLs (10 Units total) into the skin at bedtime. Patient taking differently: Inject 24 Units into the skin at bedtime. 12/11/23  Yes Johnson, Clanford L, MD  lidocaine  (LIDODERM ) 5 % Place 1 patch onto the skin daily. 05/05/24  Yes [provider]  magnesium  oxide (MAG-OX) 400 MG tablet Take 400 mg by mouth 2 (two) times daily. 12/04/23  Yes [provider]  metoprolol  succinate (TOPROL -XL)  50 MG 24 hr tablet Take 0.5 tablets (25 mg total) by mouth daily. 03/23/24  Yes Emokpae, Courage, MD  vitamin B-12 (CYANOCOBALAMIN ) 1000 MCG tablet Take 1,000 mcg by mouth in the morning.   Yes [provider]  amoxicillin -clavulanate (AUGMENTIN ) 875-125 MG tablet Take 1 tablet by mouth 2 (two) times daily. Patient not taking: Reported on 05/13/2024 05/05/24   [provider]  doxycycline  (VIBRA -TABS) 100 MG tablet Take 100 mg by mouth 2 (two) times daily. Patient not taking: Reported on 05/13/2024 05/05/24   [provider]  pantoprazole  (PROTONIX ) 40 MG tablet Take 1 tablet (40 mg total) by mouth daily. 03/23/24   Pearlean Manus, MD  pregabalin  (LYRICA ) 75 MG capsule Take 75 mg by mouth 2 (two) times daily. Patient not taking: Reported on 05/13/2024 01/22/24   [provider]    Allergies: Baclofen and Acarbose    Review of Systems  Constitutional:  Positive for fatigue.  Neurological:  Positive for tremors.       Altered mental status  All other systems reviewed and are negative.   Updated Vital Signs BP (!) 140/61 (BP Location: Right Arm)   Pulse 89   Temp 97.7 F (36.5 C) (Oral)   Resp 17   Ht 5' 7 (1.702 m)   Wt 59 kg   SpO2 98%   BMI 20.36 kg/m   Physical Exam Vitals and nursing note reviewed.  Constitutional:  General: He is not in acute distress.    Appearance: Normal appearance. He is not ill-appearing.  HENT:     Head: Normocephalic and atraumatic.     Nose: Nose normal.     Mouth/Throat:     Mouth: Mucous membranes are moist.  Eyes:     Extraocular Movements: Extraocular movements intact.     Conjunctiva/sclera: Conjunctivae normal.     Pupils: Pupils are equal, round, and reactive to light.  Cardiovascular:     Rate and Rhythm: Normal rate and regular rhythm.     Pulses: Normal pulses.     Heart sounds: Normal heart sounds. No murmur heard.    No gallop.  Pulmonary:     Effort: Pulmonary effort is normal. No  respiratory distress.     Breath sounds: Normal breath sounds. No stridor. No wheezing, rhonchi or rales.  Abdominal:     General: Abdomen is flat. Bowel sounds are normal. There is no distension.     Palpations: Abdomen is soft.     Tenderness: There is no abdominal tenderness. There is no guarding.  Musculoskeletal:        General: Normal range of motion.     Cervical back: Normal range of motion and neck supple. No rigidity or tenderness.     Right lower leg: No edema.     Left lower leg: No edema.  Skin:    General: Skin is warm and dry.     Findings: No bruising or rash.  Neurological:     General: No focal deficit present.     Mental Status: He is alert and oriented to person, place, and time. Mental status is at baseline.     Cranial Nerves: No cranial nerve deficit.     Sensory: No sensory deficit.     Motor: No weakness.     Coordination: Coordination normal.     Gait: Gait normal.  Psychiatric:        Mood and Affect: Mood normal.        Behavior: Behavior normal.        Thought Content: Thought content normal.        Judgment: Judgment normal.     (all labs ordered are listed, but only abnormal results are displayed) Labs Reviewed  CBC WITH DIFFERENTIAL/PLATELET - Abnormal; Notable for the following components:      Result Value   WBC 16.7 (*)    RBC 3.66 (*)    Hemoglobin 9.1 (*)    HCT 29.2 (*)    MCV 79.8 (*)    MCH 24.9 (*)    RDW 19.1 (*)    Platelets 544 (*)    Neutro Abs 13.5 (*)    Monocytes Absolute 1.8 (*)    Abs Immature Granulocytes 0.19 (*)    All other components within normal limits  LACTIC ACID, PLASMA - Abnormal; Notable for the following components:   Lactic Acid, Venous 2.6 (*)    All other components within normal limits  TROPONIN T, HIGH SENSITIVITY - Abnormal; Notable for the following components:   Troponin T High Sensitivity 129 (*)    All other components within normal limits  CULTURE, BLOOD (ROUTINE X 2)  CULTURE, BLOOD  (ROUTINE X 2)  MAGNESIUM   COMPREHENSIVE METABOLIC PANEL WITH GFR  URINALYSIS, ROUTINE W REFLEX MICROSCOPIC  LACTIC ACID, PLASMA  TSH  AMMONIA  TROPONIN T, HIGH SENSITIVITY    EKG: None  Radiology: [facility] Chest Port 1 View Result Date: 05/13/2024 CLINICAL DATA:  Weakness EXAM: PORTABLE CHEST 1 VIEW COMPARISON:  Chest x-ray performed April 14, 2024 FINDINGS: Postsurgical changes from sternotomy. Low lung volumes. Similar appearance of interstitial airspace opacities. Elevated left hemidiaphragm. IMPRESSION: 1. Low lung volumes. 2. Grossly similar accounting for variation in technique. Electronically Signed   By: Maude Naegeli M.D.   On: 05/13/2024 13:21   VAS US  ABI WITH/WO TBI Result Date: 05/13/2024  LOWER EXTREMITY DOPPLER STUDY Patient Name:  [name]  Date of Exam:   05/13/2024 Medical Rec #: [id number]     Accession #:    [id number] Date of Birth: Jun 09, 1946     Patient Gender: M Patient Age:   51 years Exam Location:  [address] Procedure:      VAS US  ABI WITH/WO TBI Referring Phys: LONNI GASKINS --------------------------------------------------------------------------------  Indications: Peripheral artery disease.  Vascular Interventions: 04/17/24: DCBA right CIA and EIA stents.                         09/22/21: Right CIA and EIA angioplasty with CIA stent.                         08/09/16: Right iliofemoral endarterectomy.                         Bovine pericardial patch angioplasty, right common                         femoral artery.                         Redo right limb of right to left femoral-femoral bypass                         graft. Performing Technologist: King Pierre RVT  Examination Guidelines: A complete evaluation includes at minimum, Doppler waveform signals and systolic blood pressure reading at the level of bilateral brachial, anterior tibial, and posterior tibial arteries, when vessel segments are accessible. Bilateral testing is considered an integral part of a  complete examination. Photoelectric Plethysmograph (PPG) waveforms and toe systolic pressure readings are included as required and additional duplex testing as needed. Limited examinations for reoccurring indications may be performed as noted.  ABI Findings: +---------+------------------+-----+----------+--------+ Right    Rt Pressure (mmHg)IndexWaveform  Comment  +---------+------------------+-----+----------+--------+ Brachial 157                                       +---------+------------------+-----+----------+--------+ PTA      254               1.62 monophasic         +---------+------------------+-----+----------+--------+ DP       85                0.54 monophasic         +---------+------------------+-----+----------+--------+ Great Toe0                 0.00                    +---------+------------------+-----+----------+--------+ +---------+------------------+-----+----------+-------+ Left     Lt Pressure (mmHg)IndexWaveform  Comment +---------+------------------+-----+----------+-------+ Brachial 153                                      +---------+------------------+-----+----------+-------+  PTA      254               1.62 monophasic        +---------+------------------+-----+----------+-------+ DP       254               1.62 monophasic        +---------+------------------+-----+----------+-------+ Great Toe0                 0.00                   +---------+------------------+-----+----------+-------+ +-------+-----------+-----------+------------+------------+ ABI/TBIToday's ABIToday's TBIPrevious ABIPrevious TBI +-------+-----------+-----------+------------+------------+ Right  [place]                    0.7         0.28         +-------+-----------+-----------+------------+------------+ Left   [place]                    [place]          0.18         +-------+-----------+-----------+------------+------------+  Previous ABI on 01/22/24  0.7/0.28 [place]/0.18.  Summary: Right: Resting right ankle-brachial index indicates noncompressible right lower extremity arteries. The right toe-brachial index is abnormal.  Left: Resting left ankle-brachial index indicates noncompressible left lower extremity arteries. The left toe-brachial index is abnormal.  *See table(s) above for measurements and observations.  Electronically signed by Lonni Gaskins MD on 05/13/2024 at 12:34:42 PM.    Final    VAS US  AORTA/IVC/ILIACS Result Date: 05/13/2024 ABDOMINAL AORTA STUDY Patient Name:  [name]  Date of Exam:   05/13/2024 Medical Rec #: [id number]     Accession #:    [id number] Date of Birth: 1945/09/13     Patient Gender: M Patient Age:   57 years Exam Location:  [address] Procedure:      VAS US  AORTA/IVC/ILIACS Referring Phys: LONNI GASKINS --------------------------------------------------------------------------------  Vascular Interventions: 04/17/24: DCBA right CIA and EIA stents.                         09/22/21: Right CIA and EIA angioplasty with CIA stent.                         08/09/16: Right iliofemoral endarterectomy.                         Bovine pericardial patch angioplasty, right common                         femoral artery.                         Redo right limb of right to left femoral-femoral bypass                         graft.  Comparison Study: 01/22/24: Patent right CIA and EIA stents with elevated                   velocities in the EIA in the > 50% range. Patent right to left                   fem- fem BPG with 50-74% stenosis at both anastomoses. Performing Technologist: King Pierre RVT  Examination Guidelines: A  complete evaluation includes B-mode imaging, spectral Doppler, color Doppler, and power Doppler as needed of all accessible portions of each vessel. Bilateral testing is considered an integral part of a complete examination. Limited examinations for reoccurring indications may be performed as noted.  Abdominal Aorta  Findings: +-------------+-------+----------+----------+----------+--------+---------+ Location     AP (cm)Trans (cm)PSV (cm/s)Waveform  ThrombusComments  +-------------+-------+----------+----------+----------+--------+---------+ RT CIA Prox                   217       biphasic                    +-------------+-------+----------+----------+----------+--------+---------+ RT CIA Mid                    299       monophasic                  +-------------+-------+----------+----------+----------+--------+---------+ RT CIA Distal                 154       monophasic        turbulent +-------------+-------+----------+----------+----------+--------+---------+ RT EIA Prox                   467       monophasic                  +-------------+-------+----------+----------+----------+--------+---------+ RT EIA Mid                    388       monophasic                  +-------------+-------+----------+----------+----------+--------+---------+ RT EIA Distal                 383       monophasic                  +-------------+-------+----------+----------+----------+--------+---------+ Right Graft #1: +---------------------+--------+--------+----------+--------+ Right to left fem-femPSV cm/sStenosisWaveform  Comments +---------------------+--------+--------+----------+--------+ Inflow               383             monophasic         +---------------------+--------+--------+----------+--------+ Prox Anastomosis     272             monophasic         +---------------------+--------+--------+----------+--------+ Proximal Graft       104             monophasic         +---------------------+--------+--------+----------+--------+ Mid Graft            60              monophasic         +---------------------+--------+--------+----------+--------+ Distal Graft         62              biphasic            +---------------------+--------+--------+----------+--------+ Distal Anastomosis   222             biphasic           +---------------------+--------+--------+----------+--------+ Outflow              367             monophasic         +---------------------+--------+--------+----------+--------+  Summary: Stenosis: Elevated velocities in the CIA with significant elevation in the EIA. Patent right  to left fem-fem with inflow stenosis (EIA)and outflow stenosis (SFA).  *See table(s) above for measurements and observations.  Electronically signed by Lonni Gaskins MD on 05/13/2024 at 12:01:43 PM.    Final      .Critical Care  Performed by: Daralene Lonni BIRCH, PA-C Authorized by: Daralene Lonni BIRCH, PA-C   Critical care provider statement:    Critical care time (minutes):  35   Critical care was necessary to treat or prevent imminent or life-threatening deterioration of the following conditions:  Renal failure and sepsis   Critical care was time spent personally by me on the following activities:  Development of treatment plan with patient or surrogate, discussions with consultants, evaluation of patient's response to treatment, examination of patient, ordering and review of laboratory studies, ordering and review of radiographic studies, ordering and performing treatments and interventions, pulse oximetry, re-evaluation of patient's condition and review of old charts   I assumed direction of critical care for this patient from another provider in my specialty: no     Care discussed with: admitting provider      Medications Ordered in the ED  lactated ringers  bolus 1,000 mL (has no administration in time range)  cefTRIAXone  (ROCEPHIN ) 1 g in sodium chloride  0.9 % 100 mL IVPB (has no administration in time range)  lactated ringers  bolus 500 mL (500 mLs Intravenous New Bag/Given 05/13/24 1359)                                    Medical Decision Making Amount and/or Complexity  of Data Reviewed Labs: ordered. Radiology: ordered.  Risk Decision regarding hospitalization.   This patient presents to the ED for concern of confusion, generalized weakness, this involves an extensive number of treatment options, and is a complaint that carries with it a high risk of complications and morbidity.  The differential diagnosis includes sepsis, pneumonia, urinary tract infection, dehydration, acute kidney injury, electrolyte derangement   Co morbidities that complicate the patient evaluation  Chronic kidney disease, hypertension, pneumonia   Additional history obtained:  Additional history obtained from wife External records from outside source obtained and reviewed including medical records   Lab Tests:  I Ordered, and personally interpreted labs.  The pertinent results include: Leukocytosis, anemia, mild hyponatremia, hyperglycemia, elevated creatinine, urinalysis with moderate leukocytes, elevated serial troponins, negative ammonia   Imaging Studies ordered:  I ordered imaging studies including CT scan head, chest x-ray I independently visualized and interpreted imaging which showed no acute intracranial process, no acute cardiopulmonary process I agree with the radiologist interpretation   Cardiac Monitoring: / EKG:  The patient was maintained on a cardiac monitor.  I personally viewed and interpreted the cardiac monitored which showed an underlying rhythm of: Normal sinus rhythm, no ST/T wave changes, no ischemic changes, no STEMI   Consultations Obtained:  I requested consultation with the hospitalist,  and discussed lab and imaging findings as well as pertinent plan - they recommend: Admission   Problem List / ED Course / Critical interventions / Medication management  Patient is doing well at this time and does remain stable.  Discussed with patient and family that we will plan for admission to the hospital service for apparent sepsis, urinary tract  infection, acute kidney injury.  Patient has have stable elevated serial troponins and has had no chest pain or shortness of breath.  Chest x-ray was otherwise unremarkable with no indication for pneumonia.  He had no indication for intracranial hemorrhage or obvious CVA.  Patient's anemia is stable.  He does have an associated leukocytosis.  Lactic acid is downtrending at this time.  He has been given IV fluids as well as Rocephin .  Have discussed patient case with Dr. Pearlean with the hospitalist service who has excepted for admission. I ordered medication including IV fluids, Rocephin  for urinary tract infection, sepsis Reevaluation of the patient after these medicines showed that the patient improved I have reviewed the patients home medicines and have made adjustments as needed   Social Determinants of Health:  None   Test / Admission - Considered:  Admission     Final diagnoses:  None    ED Discharge Orders     None          Daralene Lonni JONETTA DEVONNA 05/13/24 1745    Suzette Pac, MD 05/14/24 1642

## 2024-05-13 NOTE — ED Triage Notes (Signed)
 Pt arrived via POV with his spouse who is requesting evaluation for multiple complaints. Per Pts spouse, Pt has had worsening health over the past month with decreased appetite, night time confusion, unsteady gait, tremors, recently fracturing 5 ribs, having pneumonia and recent weight loss.

## 2024-05-13 NOTE — ED Notes (Signed)
 Critical lab results communicated face to face with Zammit MD Trop 129 and Lactic 2.9

## 2024-05-13 NOTE — Progress Notes (Signed)
 Office Note     CC:  follow up Requesting Provider:  [facility]  HPI: [name] is a 78 y.o. (12/25/1945) male who presents status post aortogram with drug-coated balloon angioplasty of the right common and external iliac artery stents by Dr. Gretta on 04/17/2024.  This was performed due to a threatened femorofemoral bypass with high-grade stenosis in the right iliac artery found on surveillance duplex.  He has extensive surgical history with right to left femorofemoral bypass with Dr. Dyane years ago.  He has had multiple stenting procedures of his right common and external iliac artery since then.  He believes his left groin catheterization site is well-healed.  He is without rest pain or tissue loss.  He is on an aspirin , Plavix , statin daily.  Unfortunately [name] sustained a fall before his angiogram resulting in rib fractures and subsequent pneumonia.  He is accompanied today by his wife who is concerned about his overall health.  Over the past couple months she has noticed several concerns including confusion overnight and in the morning, lack of appetite, unsteady gait and most recently she has noticed tremors.  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   x 5   Hypercholesteremia    Hypertension    Neuropathy    Peripheral vascular disease    s/p R-L FEM-FEM BYPASS   Pneumonia    Tachycardia    after CABG, pt on medicine for this    Past Surgical History:  Procedure Laterality Date   ABDOMINAL AORTOGRAM Bilateral 09/19/2016   Procedure: iliac;  Surgeon: Gaile LELON New, MD;  [facility]: [facility];  Service: Cardiovascular;   Laterality: Bilateral;   ABDOMINAL AORTOGRAM N/A 04/17/2024   Procedure: ABDOMINAL AORTOGRAM;  Surgeon: Gretta Lonni PARAS, MD;  [facility] INVAS[facility] CV [facility];  Service: Cardiovascular;  Laterality: N/A;   ABDOMINAL AORTOGRAM W/LOWER EXTREMITY N/A 09/22/2021   Procedure: ABDOMINAL AORTOGRAM W/LOWER EXTREMITY;  Surgeon: Gretta Lonni PARAS, MD;  Location: [facility];  Service: Cardiovascular;  Laterality: N/A;   ABDOMINAL AORTOGRAM W/LOWER EXTREMITY N/A 10/05/2022   Procedure: ABDOMINAL AORTOGRAM W/LOWER EXTREMITY;  Surgeon: Gretta Lonni PARAS, MD;  [facility]: [facility];  Service: Cardiovascular;  Laterality: N/A;   BIOPSY  05/16/2023   Procedure: BIOPSY;  Surgeon: Shaaron Lamar HERO, MD;  [facility];  Service: Endoscopy;;   CARDIAC CATHETERIZATION  2003   with stent   CARDIAC CATHETERIZATION N/A 10/11/2015   Procedure: Left Heart Cath and Coronary Angiography;  Surgeon: Peter M Swaziland, MD;  Location: [facility] INVASIVE CV LAB;  Service: Cardiovascular;  Laterality: N/A;   COLON RESECTION N/A 12/13/2017   Procedure: EXPLORATORY LAPAROTOMY, SIGMOID COLECTOMY WITH COLOSTOMY;  Surgeon: Belinda Cough, MD;  [facility]: [facility];  Service: General;  Laterality: N/A;   COLONOSCOPY N/A 09/22/2013   Procedure: COLONOSCOPY;  Surgeon: Lamar HERO Shaaron, MD;  [facility];  Service: Endoscopy;  Laterality: N/A;  9:30 AM   COLONOSCOPY N/A 02/19/2018   Procedure: COLONOSCOPY;  Surgeon: Shaaron Lamar HERO, MD;  [facility];  Service: Endoscopy;  Laterality: N/A;  2:00pm  COLONOSCOPY WITH PROPOFOL  N/A 05/16/2023   Procedure: COLONOSCOPY WITH PROPOFOL ;  Surgeon: Shaaron Lamar HERO, MD;  [facility];  Service: Endoscopy;  Laterality: N/A;  730am, asa 3   COLOSTOMY N/A 12/13/2017   Procedure: COLOSTOMY;  Surgeon: Belinda Cough, MD;  Location: [facility] OR;  Service: General;  Laterality: N/A;   COLOSTOMY REVERSAL N/A 03/26/2018   Procedure: COLOSTOMY REVERSAL;  Surgeon:  Belinda Cough, MD;  [facility];  Service: General;  Laterality: N/A;   CORONARY ARTERY BYPASS GRAFT N/A 10/18/2015   Procedure: CORONARY ARTERY BYPASS GRAFTING (CABG) x  five, using left internal mammary artery and right leg greater saphenous vein harvested endoscopically;  Surgeon: Elspeth JAYSON Millers, MD;  Location: [facility];  Service: Open Heart Surgery;  Laterality: N/A;   ENDARTERECTOMY FEMORAL Right 08/09/2016   Procedure: ENDARTERECTOMY FEMORAL WITH VEIN PATCH ANGIOPLASTY;  Surgeon: Gaile LELON New, MD;  [facility];  Service: Vascular;  Laterality: Right;   ESOPHAGOGASTRODUODENOSCOPY N/A 10/24/2017   Dr. Shaaron: hiatal hernia   ESOPHAGOGASTRODUODENOSCOPY N/A 01/01/2024   Procedure: EGD (ESOPHAGOGASTRODUODENOSCOPY);  Surgeon: Cinderella Deatrice FALCON, MD;  [facility];  Service: Endoscopy;  Laterality: N/A;   ESOPHAGOGASTRODUODENOSCOPY (EGD) WITH PROPOFOL  N/A 05/16/2023   Procedure: ESOPHAGOGASTRODUODENOSCOPY (EGD) WITH PROPOFOL ;  Surgeon: Shaaron Lamar HERO, MD;  [facility];  Service: Endoscopy;  Laterality: N/A;   FEMORAL-FEMORAL BYPASS GRAFT Bilateral 08/09/2016   Procedure: REVISION BYPASS GRAFT RIGHT FEMORAL-LEFT FEMORAL ARTERY;  Surgeon: Gaile LELON New, MD;  Location: [facility] OR;  Service: Vascular;  Laterality: Bilateral;   FEMORAL-POPLITEAL BYPASS GRAFT     INTRAMEDULLARY (IM) NAIL INTERTROCHANTERIC Left 04/03/2021   Procedure: LEFT  HIP INTRAMEDULLARY (IM) NAIL INTERTROCHANTRIC;  Surgeon: Vernetta Lonni GRADE, MD;  [facility];  Service: Orthopedics;  Laterality: Left;   IR FLUORO GUIDED NEEDLE PLC ASPIRATION/INJECTION LOC  12/26/2023   IR IVC FILTER RETRIEVAL / S&I /IMG GUID/MOD SED  01/15/2024   LOWER EXTREMITY ANGIOGRAPHY N/A 04/17/2024   Procedure: Lower Extremity Angiography;  Surgeon: Gretta Lonni PARAS, MD;  Location: [facility] INVASIVE CV LAB;  Service: Cardiovascular;  Laterality: N/A;   LOWER EXTREMITY INTERVENTION N/A 04/17/2024   Procedure: LOWER  EXTREMITY INTERVENTION;  Surgeon: Gretta Lonni PARAS, MD;  [facility];  Service: Cardiovascular;  Laterality: N/A;   MALONEY DILATION N/A 05/16/2023   Procedure: AGAPITO DILATION;  Surgeon: Shaaron Lamar HERO, MD;  [facility];  Service: Endoscopy;  Laterality: N/A;   PERIPHERAL VASCULAR BALLOON ANGIOPLASTY Right 09/22/2021   Procedure: PERIPHERAL VASCULAR BALLOON ANGIOPLASTY;  Surgeon: Gretta Lonni PARAS, MD;  [facility];  Service: Cardiovascular;  Laterality: Right;   PERIPHERAL VASCULAR CATHETERIZATION N/A 05/08/2016   Procedure: Lower Extremity Angiography;  Surgeon: Dorn PARAS Lesches, MD;  Location: [facility] INVASIVE CV LAB;  Service: Cardiovascular;  Laterality: N/A;   PERIPHERAL VASCULAR INTERVENTION Right 09/19/2016   Procedure: Peripheral Vascular Intervention;  Surgeon: Gaile LELON New, MD;  [facility];  Service: Cardiovascular;  Laterality: Right;  ext iliac stent   PERIPHERAL VASCULAR INTERVENTION Right 09/22/2021   Procedure: PERIPHERAL VASCULAR INTERVENTION;  Surgeon: Gretta Lonni PARAS, MD;  [facility];  Service: Cardiovascular;  Laterality: Right;   POLYPECTOMY  02/19/2018   Procedure: POLYPECTOMY;  Surgeon: Shaaron Lamar HERO, MD;  [facility];  Service: Endoscopy;;   PROCTOSCOPY  10/24/2017   Procedure: PROCTOSCOPY;  Surgeon: Shaaron Lamar HERO, MD;  [facility];  Service: Endoscopy;;  TEE WITHOUT CARDIOVERSION N/A 10/18/2015   Procedure: TRANSESOPHAGEAL ECHOCARDIOGRAM (TEE);  Surgeon: Elspeth JAYSON Millers, MD;  Location: [facility] OR;  Service: Open Heart Surgery;  Laterality: N/A;    Social History   Socioeconomic History   Marital status: Married    Spouse name: Not on file   Number of children: Not on file   Years of education: Not on file   Highest education level: Not on file  Occupational History   Not on file  Tobacco Use   Smoking status: Former    Current packs/day:  0.00    Average packs/day: 2.0 packs/day for 30.0 years (60.0 ttl pk-yrs)    Types: Cigarettes    Start date: 08/14/1970    Quit date: 08/14/2000    Years since quitting: 23.7   Smokeless tobacco: Never  Vaping Use   Vaping status: Never Used  Substance and Sexual Activity   Alcohol  use: No   Drug use: No   Sexual activity: Not Currently  Other Topics Concern   Not on file  Social History Narrative   Not on file   Social Drivers of Health   Financial Resource Strain: Low Risk  (10/08/2023)   Received from [facility]   Overall Financial Resource Strain (CARDIA)    Difficulty of Paying Living Expenses: Not very hard  Food Insecurity: No Food Insecurity (03/21/2024)   Hunger Vital Sign    Worried About Running Out of Food in the Last Year: Never true    Ran Out of Food in the Last Year: Never true  Transportation Needs: No Transportation Needs (03/21/2024)   PRAPARE - Administrator, Civil Service (Medical): No    Lack of Transportation (Non-Medical): No  Physical Activity: Not on file  Stress: Not on file  Social Connections: Socially Integrated (03/21/2024)   Social Connection and Isolation Panel    Frequency of Communication with Friends and Family: More than three times a week    Frequency of Social Gatherings with Friends and Family: Three times a week    Attends Religious Services: More than 4 times per year    Active Member of Clubs or Organizations: Yes    Attends Banker Meetings: 1 to 4 times per year    Marital Status: Married  Catering manager Violence: Not At Risk (03/21/2024)   Humiliation, Afraid, Rape, and Kick questionnaire    Fear of Current or Ex-Partner: No    Emotionally Abused: No    Physically Abused: No    Sexually Abused: No    Family History  Problem Relation Age of Onset   Heart attack Mother    Stroke Mother    Colon cancer Brother        late 13s    Current Outpatient Medications  Medication Sig Dispense Refill    amLODipine  (NORVASC ) 10 MG tablet Take 10 mg by mouth in the morning.     aspirin  EC 81 MG tablet Take 1 tablet (81 mg total) by mouth daily with breakfast. 30 tablet 2   atorvastatin  (LIPITOR ) 40 MG tablet Take 40 mg by mouth at bedtime.     cilostazol  (PLETAL ) 100 MG tablet Take 100 mg by mouth 2 (two) times daily.     clopidogrel  (PLAVIX ) 75 MG tablet Take 1 tablet (75 mg total) by mouth in the morning. 30 tablet 11   guaiFENesin  (MUCINEX ) 600 MG 12 hr tablet Take 1 tablet (600 mg total) by mouth 2 (two) times daily. 60 tablet  2   insulin  glargine (LANTUS ) 100 UNIT/ML injection Inject 0.1 mLs (10 Units total) into the skin at bedtime. (Patient taking differently: Inject 20 Units into the skin at bedtime.)     levofloxacin  (LEVAQUIN ) 750 MG tablet Take 1 tablet (750 mg total) by mouth daily. 7 tablet 0   magnesium  oxide (MAG-OX) 400 MG tablet Take 400 mg by mouth 2 (two) times daily.     metoprolol  succinate (TOPROL -XL) 50 MG 24 hr tablet Take 0.5 tablets (25 mg total) by mouth daily. 30 tablet 5   pantoprazole  (PROTONIX ) 40 MG tablet Take 1 tablet (40 mg total) by mouth daily. 30 tablet 5   pregabalin  (LYRICA ) 75 MG capsule Take 75 mg by mouth 2 (two) times daily.     vitamin B-12 (CYANOCOBALAMIN ) 1000 MCG tablet Take 1,000 mcg by mouth in the morning.     No current facility-administered medications for this visit.    Allergies  Allergen Reactions   Baclofen Other (See Comments)    Altered mental status, extreme drowsiness   Acarbose Diarrhea     REVIEW OF SYSTEMS:   [X]  denotes positive finding, [ ]  denotes negative finding Cardiac  Comments:  Chest pain or chest pressure:    Shortness of breath upon exertion:    Short of breath when lying flat:    Irregular heart rhythm:        Vascular    Pain in calf, thigh, or hip brought on by ambulation:    Pain in feet at night that wakes you up from your sleep:     Blood clot in your veins:    Leg swelling:         Pulmonary     Oxygen at home:    Productive cough:     Wheezing:         Neurologic    Sudden weakness in arms or legs:     Sudden numbness in arms or legs:     Sudden onset of difficulty speaking or slurred speech:    Temporary loss of vision in one eye:     Problems with dizziness:         Gastrointestinal    Blood in stool:     Vomited blood:         Genitourinary    Burning when urinating:     Blood in urine:        Psychiatric    Major depression:         Hematologic    Bleeding problems:    Problems with blood clotting too easily:        Skin    Rashes or ulcers:        Constitutional    Fever or chills:      PHYSICAL EXAMINATION:  Vitals:   05/13/24 1007  BP: (!) 151/78  Pulse: 80  SpO2: 95%  Weight: 134 lb (60.8 kg)  Height: 5' 7 (1.702 m)    General:  WDWN in NAD; vital signs documented above Gait: Not observed HENT: WNL, normocephalic Pulmonary: normal non-labored breathing Cardiac: regular HR Abdomen: soft, NT, no masses Skin: without rashes Vascular Exam/Pulses: 2+ palpable femoral pulses and femorofemoral bypass pulse; ATA by doppler bilaterally Extremities: without ischemic changes, without Gangrene , without cellulitis; without open wounds;  Musculoskeletal: no muscle wasting or atrophy  Neurologic: A&O X 3 Psychiatric:  The pt has Normal affect.   Non-Invasive Vascular Imaging:   Elevated velocities in the right external iliac artery  after intervention  Patent femorofemoral bypass  Incompressible tibial vessels and flat toe waveforms    ASSESSMENT/PLAN:: 78 y.o. male status post aortogram with drug-coated balloon angioplasty of the right common and external iliac artery stents  Mr. Stradling is a 78 year old male well-known to [facility] with numerous procedures in the past including history of right to left femorofemoral bypass.  He has required numerous endovascular interventions on the right common and external iliac artery.  Most recently he underwent  right common and external iliac artery angioplasty by Dr. Gretta on 04/17/2024.  Duplex today shows persistently elevated velocities despite intervention.  On exam he has a 2+ palpable femorofemoral bypass pulse as well as 2+ femoral pulses.  His feet are warm and well-perfused.  His wife is very concerned regarding his declining health.  He sustained a fall prior to his angiogram resulting in rib fractures and subsequent pneumonia.  He has been having noticeable issues with memory and has had confusion overnight and in the early morning.  He has not been eating or drinking and has been sleeping during much of the day.  He has an unsteady gait and his wife is concerned about the potential for another fall.  She is also concerned about tremors in his arms and hands that he has developed over the past several days.  Given his clinical picture involving all of the above I recommended presenting to the emergency department for potential admission due to failure to thrive.  I am unsure of the etiology however I share the concern for rapid decompensation in the near future.   Donnice Sender, PA-C [facility] [phone]

## 2024-05-13 NOTE — H&P (Signed)
 History and Physical    [name] FMW:[id number] DOB: 06-24-1946 DOA: 05/13/2024  PCP: [facility]  Patient coming from: Home  I have personally briefly reviewed patient's old medical records in [facility]  Chief Complaint: Failure to thrive  HPI: [name] is a 78 y.o. male with medical history significant for CABG, hypertension, CKD 3, diabetes mellitus. 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 On my evaluation, patient is awake, mildly lethargic, able to answer restraints but a bit confused.  Patient's spouse, daughter and grandson at bedside.  Patient has mostly had confusion at night over the past several days, but today he has been confused all day.  He has had poor oral intake over the past few weeks.  No vomiting no diarrhea, no complaints of chest or abdominal pain.  He has been generally weak and unable to ambulate.  Today they noticed tremors.  Patient had a fall with subsequent rib fractures 7/21 and was subsequently diagnosed with a pneumonia, and hence hospitalized 8/8 to 8/10 and treated with a course of antibiotics.  He started another course of Augmentin /doxycycline  9/22 - reports he just completed it 2 days ago for pneumonia.   ED Course: Temperature 97.7.  Heart rate 85-100.  Respiratory rate 14-28.  Blood pressure systolic 134-150.  O2 sats greater than 93% on room air. Creatinine elevated 5.48 WBC 16.7. Troponin 129 > 108. Lactic acid 2.6 > 1.6. Magnesium  2.2. Chest x-ray head CT negative for acute abnormality. UA suggestive of UTI with moderate leukocytes. Started IV ceftriaxone .  2.5 L bolus given. Blood and urine cultures obtained.  Review of Systems: As per HPI all other systems reviewed and negative.  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   x 5   Hypercholesteremia    Hypertension    Neuropathy    Peripheral vascular disease    s/p R-L FEM-FEM BYPASS   Pneumonia    Tachycardia    after CABG, pt on medicine for this    Past Surgical History:  Procedure Laterality Date   ABDOMINAL AORTOGRAM Bilateral 09/19/2016   Procedure: iliac;  Surgeon: Gaile LELON New, MD;  [facility];  Service: Cardiovascular;  Laterality: Bilateral;   ABDOMINAL AORTOGRAM N/A 04/17/2024   Procedure: ABDOMINAL AORTOGRAM;  Surgeon: Gretta Lonni PARAS, MD;  Location: [facility] INVASIVE CV LAB;  Service: Cardiovascular;  Laterality: N/A;   ABDOMINAL AORTOGRAM W/LOWER EXTREMITY N/A 09/22/2021   Procedure: ABDOMINAL AORTOGRAM W/LOWER EXTREMITY;  Surgeon: Gretta Lonni PARAS, MD;  [facility];  Service: Cardiovascular;  Laterality: N/A;   ABDOMINAL AORTOGRAM W/LOWER EXTREMITY N/A 10/05/2022   Procedure: ABDOMINAL AORTOGRAM W/LOWER EXTREMITY;  Surgeon: Gretta Lonni PARAS, MD;  [facility];  Service: Cardiovascular;  Laterality: N/A;   BIOPSY  05/16/2023   Procedure: BIOPSY;  Surgeon: Shaaron Lamar HERO, MD;  [facility];  Service: Endoscopy;;   CARDIAC CATHETERIZATION  2003   with stent   CARDIAC CATHETERIZATION N/A 10/11/2015  Procedure: Left Heart Cath and Coronary Angiography;  Surgeon: Peter M Swaziland, MD;  Location: [facility] INVASIVE CV LAB;  Service: Cardiovascular;  Laterality: N/A;   COLON RESECTION N/A 12/13/2017   Procedure: EXPLORATORY LAPAROTOMY, SIGMOID COLECTOMY WITH COLOSTOMY;  Surgeon: Belinda Cough, MD;  [facility]: [facility] OR;  Service: General;  Laterality: N/A;   COLONOSCOPY N/A 09/22/2013    Procedure: COLONOSCOPY;  Surgeon: Lamar CHRISTELLA Hollingshead, MD;  [facility];  Service: Endoscopy;  Laterality: N/A;  9:30 AM   COLONOSCOPY N/A 02/19/2018   Procedure: COLONOSCOPY;  Surgeon: Hollingshead Lamar CHRISTELLA, MD;  [facility];  Service: Endoscopy;  Laterality: N/A;  2:00pm   COLONOSCOPY WITH PROPOFOL  N/A 05/16/2023   Procedure: COLONOSCOPY WITH PROPOFOL ;  Surgeon: Hollingshead Lamar CHRISTELLA, MD;  [facility];  Service: Endoscopy;  Laterality: N/A;  730am, asa 3   COLOSTOMY N/A 12/13/2017   Procedure: COLOSTOMY;  Surgeon: Belinda Cough, MD;  Location: [facility] OR;  Service: General;  Laterality: N/A;   COLOSTOMY REVERSAL N/A 03/26/2018   Procedure: COLOSTOMY REVERSAL;  Surgeon: Belinda Cough, MD;  [facility];  Service: General;  Laterality: N/A;   CORONARY ARTERY BYPASS GRAFT N/A 10/18/2015   Procedure: CORONARY ARTERY BYPASS GRAFTING (CABG) x  five, using left internal mammary artery and right leg greater saphenous vein harvested endoscopically;  Surgeon: Elspeth JAYSON Millers, MD;  [facility];  Service: Open Heart Surgery;  Laterality: N/A;   ENDARTERECTOMY FEMORAL Right 08/09/2016   Procedure: ENDARTERECTOMY FEMORAL WITH VEIN PATCH ANGIOPLASTY;  Surgeon: Gaile LELON New, MD;  [facility];  Service: Vascular;  Laterality: Right;   ESOPHAGOGASTRODUODENOSCOPY N/A 10/24/2017   Dr. Hollingshead: hiatal hernia   ESOPHAGOGASTRODUODENOSCOPY N/A 01/01/2024   Procedure: EGD (ESOPHAGOGASTRODUODENOSCOPY);  Surgeon: Cinderella Deatrice FALCON, MD;  [facility];  Service: Endoscopy;  Laterality: N/A;   ESOPHAGOGASTRODUODENOSCOPY (EGD) WITH PROPOFOL  N/A 05/16/2023   Procedure: ESOPHAGOGASTRODUODENOSCOPY (EGD) WITH PROPOFOL ;  Surgeon: Hollingshead Lamar CHRISTELLA, MD;  [facility];  Service: Endoscopy;  Laterality: N/A;   FEMORAL-FEMORAL BYPASS GRAFT Bilateral 08/09/2016   Procedure: REVISION BYPASS GRAFT RIGHT FEMORAL-LEFT FEMORAL ARTERY;  Surgeon: Gaile LELON New, MD;  Location: [facility] OR;   Service: Vascular;  Laterality: Bilateral;   FEMORAL-POPLITEAL BYPASS GRAFT     INTRAMEDULLARY (IM) NAIL INTERTROCHANTERIC Left 04/03/2021   Procedure: LEFT  HIP INTRAMEDULLARY (IM) NAIL INTERTROCHANTRIC;  Surgeon: Vernetta Lonni GRADE, MD;  [facility];  Service: Orthopedics;  Laterality: Left;   IR FLUORO GUIDED NEEDLE PLC ASPIRATION/INJECTION LOC  12/26/2023   IR IVC FILTER RETRIEVAL / S&I /IMG GUID/MOD SED  01/15/2024   LOWER EXTREMITY ANGIOGRAPHY N/A 04/17/2024   Procedure: Lower Extremity Angiography;  Surgeon: Gretta Lonni PARAS, MD;  Location: [facility] INVASIVE CV LAB;  Service: Cardiovascular;  Laterality: N/A;   LOWER EXTREMITY INTERVENTION N/A 04/17/2024   Procedure: LOWER EXTREMITY INTERVENTION;  Surgeon: Gretta Lonni PARAS, MD;  [facility];  Service: Cardiovascular;  Laterality: N/A;   MALONEY DILATION N/A 05/16/2023   Procedure: AGAPITO DILATION;  Surgeon: Hollingshead Lamar CHRISTELLA, MD;  [facility];  Service: Endoscopy;  Laterality: N/A;   PERIPHERAL VASCULAR BALLOON ANGIOPLASTY Right 09/22/2021   Procedure: PERIPHERAL VASCULAR BALLOON ANGIOPLASTY;  Surgeon: Gretta Lonni PARAS, MD;  [facility];  Service: Cardiovascular;  Laterality: Right;   PERIPHERAL VASCULAR CATHETERIZATION N/A 05/08/2016   Procedure: Lower Extremity Angiography;  Surgeon: Dorn PARAS Lesches, MD;  Location: [facility] INVASIVE CV LAB;  Service: Cardiovascular;  Laterality:  N/A;   PERIPHERAL VASCULAR INTERVENTION Right 09/19/2016   Procedure: Peripheral Vascular Intervention;  Surgeon: Gaile LELON New, MD;  Location: [facility] INVASIVE CV LAB;  Service: Cardiovascular;  Laterality: Right;  ext iliac stent   PERIPHERAL VASCULAR INTERVENTION Right 09/22/2021   Procedure: PERIPHERAL VASCULAR INTERVENTION;  Surgeon: Gretta Lonni PARAS, MD;  [facility];  Service: Cardiovascular;  Laterality: Right;   POLYPECTOMY  02/19/2018   Procedure: POLYPECTOMY;  Surgeon: Shaaron Lamar HERO, MD;  [facility];  Service: Endoscopy;;   PROCTOSCOPY  10/24/2017   Procedure: PROCTOSCOPY;  Surgeon: Shaaron Lamar HERO, MD;  [facility];  Service: Endoscopy;;   TEE WITHOUT CARDIOVERSION N/A 10/18/2015   Procedure: TRANSESOPHAGEAL ECHOCARDIOGRAM (TEE);  Surgeon: Elspeth JAYSON Millers, MD;  Location: [facility] OR;  Service: Open Heart Surgery;  Laterality: N/A;     reports that he quit smoking about 23 years ago. His smoking use included cigarettes. He started smoking about 53 years ago. He has a 60 pack-year smoking history. He has never used smokeless tobacco. He reports that he does not drink alcohol  and does not use drugs.  Allergies  Allergen Reactions   Baclofen Other (See Comments)    Altered mental status, extreme drowsiness   Acarbose Diarrhea    Family History  Problem Relation Age of Onset   Heart attack Mother    Stroke Mother    Colon cancer Brother        late 47s    Prior to Admission medications   Medication Sig Start Date End Date Taking? Authorizing Provider  aspirin  EC 81 MG tablet Take 1 tablet (81 mg total) by mouth daily with breakfast. 05/23/19  Yes Lanora Reveron, Courage, MD  atorvastatin  (LIPITOR ) 40 MG tablet Take 40 mg by mouth at bedtime. 02/07/24  Yes [provider]  cilostazol  (PLETAL ) 100 MG tablet Take 100 mg by mouth 2 (two) times daily. 02/07/24  Yes [provider]  clopidogrel  (PLAVIX ) 75 MG tablet Take 1 tablet (75 mg total) by mouth in the morning. 04/17/24  Yes Gretta Lonni PARAS, MD  insulin  glargine (LANTUS ) 100 UNIT/ML injection Inject 0.1 mLs (10 Units total) into the skin at bedtime. Patient taking differently: Inject 24 Units into the skin at bedtime. 12/11/23  Yes Johnson, Clanford L, MD  lidocaine  (LIDODERM ) 5 % Place 1 patch onto the skin daily. 05/05/24  Yes [provider]  magnesium  oxide (MAG-OX) 400 MG tablet Take 400 mg by mouth 2 (two) times daily. 12/04/23  Yes [provider]   metoprolol  succinate (TOPROL -XL) 50 MG 24 hr tablet Take 0.5 tablets (25 mg total) by mouth daily. 03/23/24  Yes Vanden Fawaz, Courage, MD  vitamin B-12 (CYANOCOBALAMIN ) 1000 MCG tablet Take 1,000 mcg by mouth in the morning.   Yes [provider]  amoxicillin -clavulanate (AUGMENTIN ) 875-125 MG tablet Take 1 tablet by mouth 2 (two) times daily. Patient not taking: Reported on 05/13/2024 05/05/24   [provider]  doxycycline  (VIBRA -TABS) 100 MG tablet Take 100 mg by mouth 2 (two) times daily. Patient not taking: Reported on 05/13/2024 05/05/24   [provider]  pantoprazole  (PROTONIX ) 40 MG tablet Take 1 tablet (40 mg total) by mouth daily. 03/23/24   Pearlean Manus, MD  pregabalin  (LYRICA ) 75 MG capsule Take 75 mg by mouth 2 (two) times daily. Patient not taking: Reported on 05/13/2024 01/22/24   [provider]    Physical Exam: Vitals:   05/13/24 1730 05/13/24 1745 05/13/24 1750 05/13/24 1822  BP: (!) 153/69 (!) 149/72  (!) 146/85  Pulse: 97 98  100  Resp: 18 14  20   Temp:   98.2 F (36.8 C) 98.4 F (36.9 C)  TempSrc:   Oral Oral  SpO2: 95% 93%  93%  Weight:    62.6 kg  Height:    5' 7 (1.702 m)    Constitutional: NAD, calm, comfortable Vitals:   05/13/24 1730 05/13/24 1745 05/13/24 1750 05/13/24 1822  BP: (!) 153/69 (!) 149/72  (!) 146/85  Pulse: 97 98  100  Resp: 18 14  20   Temp:   98.2 F (36.8 C) 98.4 F (36.9 C)  TempSrc:   Oral Oral  SpO2: 95% 93%  93%  Weight:    62.6 kg  Height:    5' 7 (1.702 m)   Eyes: PERRL, lids and conjunctivae normal ENMT: Mucous membranes are moist.   Neck: normal, supple, no masses, no thyromegaly Respiratory: clear to auscultation bilaterally, no wheezing, no crackles. Normal respiratory effort. No accessory muscle use.  Cardiovascular: Regular rate and rhythm, 3/6 cardiac murmur/ rubs / gallops. No extremity edema.  Extremities warm. Abdomen: no tenderness, no masses palpated. No hepatosplenomegaly.  Bowel sounds positive.  Musculoskeletal: no clubbing / cyanosis. No joint deformity upper and lower extremities.  Skin: no rashes, lesions, ulcers. No induration Neurologic: No facial asymmetry, moving extremities spontaneously, speech fluent .  Psychiatric: Normal judgment and insight. Alert and oriented x 3. Normal mood.   Labs on Admission: I have personally reviewed following labs and imaging studies  CBC: Recent Labs  Lab 05/13/24 1240  WBC 16.7*  NEUTROABS 13.5*  HGB 9.1*  HCT 29.2*  MCV 79.8*  PLT 544*   Basic Metabolic Panel: Recent Labs  Lab 05/13/24 1240 05/13/24 1250  NA 132*  --   K 4.6  --   CL 96*  --   CO2 16*  --   GLUCOSE 301*  --   BUN 64*  --   CREATININE 5.48*  --   CALCIUM  9.4  --   MG  --  2.2   GFR: Estimated Creatinine Clearance: 10.2 mL/min (A) (by C-G formula based on SCr of 5.48 mg/dL (H)). Liver Function Tests: Recent Labs  Lab 05/13/24 1240  AST 29  ALT 10  ALKPHOS 138*  BILITOT 0.4  PROT 7.8  ALBUMIN  3.7   No results for input(s): LIPASE, AMYLASE in the last 168 hours. Recent Labs  Lab 05/13/24 1319  AMMONIA <13   Thyroid  Function Tests: Recent Labs    05/13/24 1250  TSH 2.250   Anemia Panel: No results for input(s): VITAMINB12, FOLATE, FERRITIN, TIBC, IRON , RETICCTPCT in the last 72 hours. Urine analysis:    Component Value Date/Time   COLORURINE YELLOW 05/13/2024 1525   APPEARANCEUR CLOUDY (A) 05/13/2024 1525   LABSPEC 1.016 05/13/2024 1525   PHURINE 5.0 05/13/2024 1525   GLUCOSEU >=500 (A) 05/13/2024 1525   HGBUR SMALL (A) 05/13/2024 1525   BILIRUBINUR NEGATIVE 05/13/2024 1525   KETONESUR NEGATIVE 05/13/2024 1525   PROTEINUR 30 (A) 05/13/2024 1525   UROBILINOGEN 0.2 03/16/2015 0120   NITRITE NEGATIVE 05/13/2024 1525   LEUKOCYTESUR MODERATE (A) 05/13/2024 1525    Radiological Exams on Admission: CT Head Wo Contrast Result Date: 05/13/2024 CLINICAL DATA:  Worsening health over the past month  with decreased appetite, nighttime confusion, unsteady gait and tremors. EXAM: CT HEAD WITHOUT CONTRAST TECHNIQUE: Contiguous axial images were obtained from the base of the skull through the vertex without intravenous  contrast. RADIATION DOSE REDUCTION: This exam was performed according to the departmental dose-optimization program which includes automated exposure control, adjustment of the mA and/or kV according to patient size and/or use of iterative reconstruction technique. COMPARISON:  03/29/2024 FINDINGS: Brain: Ventricles, cisterns and other CSF spaces are normal. Cavum septum variant is present. Very minimal chronic ischemic microvascular disease. 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 No mass, mass effect, shift of midline structures or acute hemorrhage. No evidence of acute infarction. Vascular: No hyperdense vessel or unexpected calcification. Skull: Normal. Negative for fracture or focal lesion. Sinuses/Orbits: No acute finding. Other: None. IMPRESSION: 1. No acute findings. 2. Very minimal chronic ischemic microvascular disease. 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 Electronically Signed   By: Toribio Agreste M.D.   On: 05/13/2024 15:48   DG Chest Port 1 View Result Date: 05/13/2024 CLINICAL DATA:  Weakness EXAM: PORTABLE CHEST 1 VIEW COMPARISON:  Chest x-ray performed April 14, 2024 FINDINGS: Postsurgical changes from sternotomy. Low lung volumes. Similar appearance of interstitial airspace opacities. Elevated left hemidiaphragm. IMPRESSION: 1. Low lung volumes. 2. Grossly similar accounting for variation in technique. Electronically Signed   By: Maude Naegeli M.D.   On: 05/13/2024 13:21   VAS US  ABI WITH/WO TBI Result Date: 05/13/2024  LOWER EXTREMITY DOPPLER STUDY Patient Name:  Anthony LAWS  Date of Exam:   05/13/2024 Medical Rec #: [id number]     Accession #:    [id number] Date of Birth: May 30, 1946     Patient Gender: M Patient Age:   26 years  Exam Location:  [address] Procedure:      VAS US  ABI WITH/WO TBI Referring Phys: LONNI GASKINS --------------------------------------------------------------------------------  Indications: Peripheral artery disease.  Vascular Interventions: 04/17/24: DCBA right CIA and EIA stents.                         09/22/21: Right CIA and EIA angioplasty with CIA stent.                         08/09/16: Right iliofemoral endarterectomy.                         Bovine pericardial patch angioplasty, right common                         femoral artery.                         Redo right limb of right to left femoral-femoral bypass                         graft. Performing Technologist: King Pierre RVT  Examination Guidelines: A complete evaluation includes at minimum, Doppler waveform signals and systolic blood pressure reading at the level of bilateral brachial, anterior tibial, and posterior tibial arteries, when vessel segments are accessible. Bilateral testing is considered an integral part of a complete examination. Photoelectric Plethysmograph (PPG) waveforms and toe systolic pressure readings are included as required and additional duplex testing as needed. Limited examinations for reoccurring indications may be performed as noted.  ABI Findings: +---------+------------------+-----+----------+--------+ Right    Rt Pressure (mmHg)IndexWaveform  Comment  +---------+------------------+-----+----------+--------+ Brachial 157                                       +---------+------------------+-----+----------+--------+  PTA      254               1.62 monophasic         +---------+------------------+-----+----------+--------+ DP       85                0.54 monophasic         +---------+------------------+-----+----------+--------+ Great Toe0                 0.00                    +---------+------------------+-----+----------+--------+  +---------+------------------+-----+----------+-------+ Left     Lt Pressure (mmHg)IndexWaveform  Comment +---------+------------------+-----+----------+-------+ Brachial 153                                      +---------+------------------+-----+----------+-------+ PTA      254               1.62 monophasic        +---------+------------------+-----+----------+-------+ DP       254               1.62 monophasic        +---------+------------------+-----+----------+-------+ Great Toe0                 0.00                   +---------+------------------+-----+----------+-------+ +-------+-----------+-----------+------------+------------+ ABI/TBIToday's ABIToday's TBIPrevious ABIPrevious TBI +-------+-----------+-----------+------------+------------+ Right  [place]                    0.7         0.28         +-------+-----------+-----------+------------+------------+ Left   [place]                    [place]          0.18         +-------+-----------+-----------+------------+------------+  Previous ABI on 01/22/24 0.7/0.28 [place]/0.18.  Summary: Right: Resting right ankle-brachial index indicates noncompressible right lower extremity arteries. The right toe-brachial index is abnormal.  Left: Resting left ankle-brachial index indicates noncompressible left lower extremity arteries. The left toe-brachial index is abnormal.  *See table(s) above for measurements and observations.  Electronically signed by Lonni Gaskins MD on 05/13/2024 at 12:34:42 PM.    Final    VAS US  AORTA/IVC/ILIACS Result Date: 05/13/2024 ABDOMINAL AORTA STUDY Patient Name:  [name]  Date of Exam:   05/13/2024 Medical Rec #: [id number]     Accession #:    [id number] Date of Birth: March 21, 1946     Patient Gender: M Patient Age:   66 years Exam Location:  [address] Procedure:      VAS US  AORTA/IVC/ILIACS Referring Phys: LONNI GASKINS  --------------------------------------------------------------------------------  Vascular Interventions: 04/17/24: DCBA right CIA and EIA stents.                         09/22/21: Right CIA and EIA angioplasty with CIA stent.                         08/09/16: Right iliofemoral endarterectomy.                         Bovine pericardial patch angioplasty, right common  femoral artery.                         Redo right limb of right to left femoral-femoral bypass                         graft.  Comparison Study: 01/22/24: Patent right CIA and EIA stents with elevated                   velocities in the EIA in the > 50% range. Patent right to left                   fem- fem BPG with 50-74% stenosis at both anastomoses. Performing Technologist: King Pierre RVT  Examination Guidelines: A complete evaluation includes B-mode imaging, spectral Doppler, color Doppler, and power Doppler as needed of all accessible portions of each vessel. Bilateral testing is considered an integral part of a complete examination. Limited examinations for reoccurring indications may be performed as noted.  Abdominal Aorta Findings: +-------------+-------+----------+----------+----------+--------+---------+ Location     AP (cm)Trans (cm)PSV (cm/s)Waveform  ThrombusComments  +-------------+-------+----------+----------+----------+--------+---------+ RT CIA Prox                   217       biphasic                    +-------------+-------+----------+----------+----------+--------+---------+ RT CIA Mid                    299       monophasic                  +-------------+-------+----------+----------+----------+--------+---------+ RT CIA Distal                 154       monophasic        turbulent +-------------+-------+----------+----------+----------+--------+---------+ RT EIA Prox                   467       monophasic                   +-------------+-------+----------+----------+----------+--------+---------+ RT EIA Mid                    388       monophasic                  +-------------+-------+----------+----------+----------+--------+---------+ RT EIA Distal                 383       monophasic                  +-------------+-------+----------+----------+----------+--------+---------+ Right Graft #1: +---------------------+--------+--------+----------+--------+ Right to left fem-femPSV cm/sStenosisWaveform  Comments +---------------------+--------+--------+----------+--------+ Inflow               383             monophasic         +---------------------+--------+--------+----------+--------+ Prox Anastomosis     272             monophasic         +---------------------+--------+--------+----------+--------+ Proximal Graft       104             monophasic         +---------------------+--------+--------+----------+--------+ Mid Graft            60  monophasic         +---------------------+--------+--------+----------+--------+ Distal Graft         62              biphasic           +---------------------+--------+--------+----------+--------+ Distal Anastomosis   222             biphasic           +---------------------+--------+--------+----------+--------+ Outflow              367             monophasic         +---------------------+--------+--------+----------+--------+  Summary: Stenosis: Elevated velocities in the CIA with significant elevation in the EIA. Patent right to left fem-fem with inflow stenosis (EIA)and outflow stenosis (SFA).  *See table(s) above for measurements and observations.  Electronically signed by Lonni Gaskins MD on 05/13/2024 at 12:01:43 PM.    Final    EKG: Independently reviewed.  Sinus rhythm, rate 86, QTc 544.  No significant change from prior.  Assessment/Plan Principal Problem:   Acute renal failure superimposed on stage  4 chronic kidney disease (HCC) Active Problems:   Acute encephalopathy   Essential hypertension   Type 2 diabetes mellitus (HCC)   Hypertension   Hx of CABG    Assessment and Plan: No notes have been filed under this hospital service. Service: Hospitalist  Acute on chronic kidney disease 4- creatinine 5.48, baseline 2.3-2.7.  Likely from poor oral intake. - 2.5 L bolus given, continue N/s 75cc/hr x 20 hrs  Acute metabolic encephalopathy-confusion, sleeping most of the day.  Likely due to dehydration, UTI.  Head CT negative for acute abnormality.  Chest x-ray clear.  Persistent cough from recent pneumonia.  - Hold Lyrica   Urinary tract infection-WBC 16.7.  Rules out for sepsis.  Lactic acid 2.6> 0.6 likely from dehydration.  Recent urine cultures on file. - Continue IV ceftriaxone  1 g daily - Follow-up blood and urine cultures  CABG-elevated troponin, no chest pain, EKG unchanged.  Troponin 129 > 108. - Resume aspirin  81 mg daily, Plavix  75 mg daily, atorvastatin  40 mg daily  Diabetes mellitus with hyperglycemia-CBG 301. - Resume home Lantus  at reduced dose 15 units daily (home dose 24 units.) - Hgba1c - SSI- S  Hypertension-stable. -Resume metoprolol  25 mg daily.  Prolonged QTc 544.  Magnesium  2.2.  Potassium 4.6.  Peripheral vascular disease.-He has had extensive surgical history with right to left femoral femoral bypass.  Multiple stenting procedures of his right common and external iliac artery.  Most recent procedure 9/4 by Dr. Gaskins. - Resume cilostazol , Plavix , aspirin . - Resume atorvastatin   DVT prophylaxis: heparin  Code Status: Full Family Communication: Spouse, daughter [name] at bedside, and grandson. Disposition Plan: > 2 days Consults called: None Admission status: Inpt tele I certify that at the point of admission it is my clinical judgment that the patient will require inpatient hospital care spanning beyond 2 midnights from the point of admission due to high  intensity of service, high risk for further deterioration and high frequency of surveillance required.   Author: Tully FORBES Carwin, MD 05/13/2024 8:15 PM  For on call review [email or web address].

## 2024-05-13 NOTE — ED Notes (Signed)
  at bedside

## 2024-05-14 ENCOUNTER — Inpatient Hospital Stay

## 2024-05-14 DIAGNOSIS — G9340 Encephalopathy, unspecified: Secondary | ICD-10-CM | POA: Diagnosis not present

## 2024-05-14 DIAGNOSIS — N3090 Cystitis, unspecified without hematuria: Secondary | ICD-10-CM | POA: Diagnosis not present

## 2024-05-14 DIAGNOSIS — N178 Other acute kidney failure: Secondary | ICD-10-CM | POA: Diagnosis not present

## 2024-05-14 DIAGNOSIS — I10 Essential (primary) hypertension: Secondary | ICD-10-CM | POA: Diagnosis not present

## 2024-05-14 DIAGNOSIS — R627 Adult failure to thrive: Secondary | ICD-10-CM | POA: Insufficient documentation

## 2024-05-14 LAB — PROCALCITONIN: Procalcitonin: 0.4 ng/mL

## 2024-05-14 LAB — HEMOGLOBIN A1C
Hgb A1c MFr Bld: 9 % — ABNORMAL HIGH (ref 4.8–5.6)
Mean Plasma Glucose: 211.6 mg/dL

## 2024-05-14 LAB — BASIC METABOLIC PANEL WITH GFR
Anion gap: 17 — ABNORMAL HIGH (ref 5–15)
BUN: 59 mg/dL — ABNORMAL HIGH (ref 8–23)
CO2: 17 mmol/L — ABNORMAL LOW (ref 22–32)
Calcium: 9.1 mg/dL (ref 8.9–10.3)
Chloride: 103 mmol/L (ref 98–111)
Creatinine, Ser: 4.26 mg/dL — ABNORMAL HIGH (ref 0.61–1.24)
GFR, Estimated: 14 mL/min — ABNORMAL LOW (ref >60)
Glucose, Bld: 104 mg/dL — ABNORMAL HIGH (ref 70–99)
Potassium: 4.4 mmol/L (ref 3.5–5.1)
Sodium: 138 mmol/L (ref 135–145)

## 2024-05-14 LAB — CBC
HCT: 28.4 % — ABNORMAL LOW (ref 39.0–52.0)
Hemoglobin: 8.6 g/dL — ABNORMAL LOW (ref 13.0–17.0)
MCH: 24.5 pg — ABNORMAL LOW (ref 26.0–34.0)
MCHC: 30.3 g/dL (ref 30.0–36.0)
MCV: 80.9 fL (ref 80.0–100.0)
Platelets: 436 K/uL — ABNORMAL HIGH (ref 150–400)
RBC: 3.51 MIL/uL — ABNORMAL LOW (ref 4.22–5.81)
RDW: 19.3 % — ABNORMAL HIGH (ref 11.5–15.5)
WBC: 16 K/uL — ABNORMAL HIGH (ref 4.0–10.5)
nRBC: 0 % (ref 0.0–0.2)

## 2024-05-14 LAB — RESP PANEL BY RT-PCR (RSV, FLU A&B, COVID)  RVPGX2
Influenza A by PCR: NEGATIVE
Influenza B by PCR: NEGATIVE
Resp Syncytial Virus by PCR: NEGATIVE
SARS Coronavirus 2 by RT PCR: NEGATIVE

## 2024-05-14 LAB — FOLATE: Folate: 12.6 ng/mL (ref >5.9)

## 2024-05-14 LAB — GLUCOSE, CAPILLARY
Glucose-Capillary: 106 mg/dL — ABNORMAL HIGH (ref 70–99)
Glucose-Capillary: 177 mg/dL — ABNORMAL HIGH (ref 70–99)
Glucose-Capillary: 103 mg/dL — ABNORMAL HIGH (ref 70–99)
Glucose-Capillary: 167 mg/dL — ABNORMAL HIGH (ref 70–99)

## 2024-05-14 LAB — CK: Total CK: 36 U/L — ABNORMAL LOW (ref 49–397)

## 2024-05-14 LAB — URINE CULTURE: Culture: NO GROWTH

## 2024-05-14 LAB — VITAMIN B12: Vitamin B-12: 762 pg/mL (ref 180–914)

## 2024-05-14 MED ORDER — ADULT MULTIVITAMIN W/MINERALS CH
1.0000 | ORAL_TABLET | Freq: Every day | ORAL | Status: DC
  Administered 2024-05-14 – 2024-05-16 (×3): 1 via ORAL
  Filled 2024-05-14 (×5): qty 1

## 2024-05-14 NOTE — Progress Notes (Signed)
 PROGRESS NOTE  [name] FMW:[id number] DOB: September 13, 1945 DOA: 05/13/2024 PCP: [facility]  Brief History:  78 year old male with history of coronary artery disease status post CABG, diabetes mellitus type 2, hypertension, hyperlipidemia, CKD stage IV, COPD, peripheral arterial disease, atrial tachycardia, diverticulitis status post colostomy and subsequent takedown, TIA presenting with generalized weakness, confusion, decreased oral intake, and difficulty ambulating.  The patient was at the vascular surgery office when he was noted to have the above symptoms.  Family noted the patient has had general decline over the past 2 months.  He has had poor oral intake for a few weeks without any vomiting or diarrhea.  There has been no complaints of chest pain or abdominal pain.  Family has noted increasing confusion over the past few days prior to admission. Notably, the patient had a hospital admission from 03/21/2024 to 03/23/2024 during which time he was treated for community-acquired pneumonia and acute on chronic renal failure. In the ED, the patient was afebrile and hemodynamically stable with oxygen saturation 93-95% room air.  WBC 16.7, hemoglobin 9.1, platelets 544.  Sodium 132, potassium 4.6, bicarbonate 16, serum creatinine 5.48.  LFTs were unremarkable.  Troponin 129>>108.  Ammonia <13.  Lactic acid 2.6>>1.6 TSH 2.250.  CT brain was negative for any acute findings.  Chest x-ray showed chronic interstitial markings.  UA >50 WBC the patient was started on IV ceftriaxone  and IV fluids.   Assessment/Plan: Acute on chronic failure--CKD 4 - Baseline creatinine 1.9-2.2 - Presented with serum creatinine 5.48 - Secondary to volume depletion - Continue IV fluids  Acute metabolic encephalopathy - Secondary to AKI and UTI - B12 - Folic acid  - TSH--2.250 -ammonia <13 - UA >50WBC - Patient remains confused  UTI - UA>50 WBC - Continue ceftriaxone  pending culture data -  Follow-up blood cultures  Failure to thrive - B12 - Folic acid  - TSH--2.250 -ammonia <13 - UA >50WBC - PT evaluation  Elevated troponin/CAD -Troponin 129>>108 - Secondary to demand ischemia - No chest pain presently - Personally reviewed EKG--sinus rhythm, no ST ST wave change -status post prior angioplasty and stent placement, status post CABG x 5   Lactic acidosis - Improved with IV fluids -Lactic acid 2.6>> 1.6  Peripheral arterial disease - Status post femorofemoral bypass - 04/17/2024--angioplasty right common iliac and external iliac arteries--Dr. Gretta - Continue aspirin , Plavix , Pletal   Essential hypertension - Continue metoprolol   - restart amlodipine  if BP poorly controlled  Mixed hyperlipidemia - Continue statin       Family Communication:  no Family at bedside  Consultants:  none  Code Status:  FULL  DVT Prophylaxis:  Wood Heparin    Procedures: As Listed in Progress Note Above  Antibiotics: Ceftriaxone  9/30>>      Subjective: Patient denies fevers, chills, headache, chest pain, dyspnea, nausea, vomiting, diarrhea, abdominal pain, dysuria, hematuria, hematochezia, and melena.   Objective: Vitals:   05/13/24 2114 05/14/24 0115 05/14/24 0416 05/14/24 0801  BP: (!) 151/72 (!) 164/73 (!) 157/60 (!) 152/62  Pulse: 100 93 89 87  Resp: 20 19 17 16   Temp: 98.6 F (37 C) 98.5 F (36.9 C) 98.1 F (36.7 C) 97.6 F (36.4 C)  TempSrc: Oral Oral Oral Oral  SpO2: 96% 91% 93% 95%  Weight:      Height:        Intake/Output Summary (Last 24 hours) at 05/14/2024 9191 Last data filed at 05/14/2024 0418 Gross per 24 hour  Intake  1995.64 ml  Output 150 ml  Net 1845.64 ml   Weight change:  Exam:  General:  Pt is alert, follows commands appropriately, not in acute distress HEENT: No icterus, No thrush, No neck mass, [employer]/AT Cardiovascular: RRR, S1/S2, no rubs, no gallops Respiratory: CTA bilaterally, no wheezing, no crackles, no rhonchi Abdomen:  Soft/+BS, non tender, non distended, no guarding Extremities: No edema, No lymphangitis, No petechiae, No rashes, no synovitis -Neuro:  CN II-XII intact, strength 4/5 in RUE, RLE, strength 4/5 LUE, LLE; sensation intact bilateral; no dysmetria; babinski equivocal    Data Reviewed: I have personally reviewed following labs and imaging studies Basic Metabolic Panel: Recent Labs  Lab 05/13/24 1240 05/13/24 1250 05/14/24 0411  NA 132*  --  138  K 4.6  --  4.4  CL 96*  --  103  CO2 16*  --  17*  GLUCOSE 301*  --  104*  BUN 64*  --  59*  CREATININE 5.48*  --  4.26*  CALCIUM  9.4  --  9.1  MG  --  2.2  --    Liver Function Tests: Recent Labs  Lab 05/13/24 1240  AST 29  ALT 10  ALKPHOS 138*  BILITOT 0.4  PROT 7.8  ALBUMIN  3.7   No results for input(s): LIPASE, AMYLASE in the last 168 hours. Recent Labs  Lab 05/13/24 1319  AMMONIA <13   Coagulation Profile: No results for input(s): INR, PROTIME in the last 168 hours. CBC: Recent Labs  Lab 05/13/24 1240 05/14/24 0411  WBC 16.7* 16.0*  NEUTROABS 13.5*  --   HGB 9.1* 8.6*  HCT 29.2* 28.4*  MCV 79.8* 80.9  PLT 544* 436*   Cardiac Enzymes: No results for input(s): CKTOTAL, CKMB, CKMBINDEX, TROPONINI in the last 168 hours. BNP: Invalid input(s): POCBNP CBG: Recent Labs  Lab 05/13/24 2038 05/14/24 0710  GLUCAP 263* 106*   HbA1C: No results for input(s): HGBA1C in the last 72 hours. Urine analysis:    Component Value Date/Time   COLORURINE YELLOW 05/13/2024 1525   APPEARANCEUR CLOUDY (A) 05/13/2024 1525   LABSPEC 1.016 05/13/2024 1525   PHURINE 5.0 05/13/2024 1525   GLUCOSEU >=500 (A) 05/13/2024 1525   HGBUR SMALL (A) 05/13/2024 1525   BILIRUBINUR NEGATIVE 05/13/2024 1525   KETONESUR NEGATIVE 05/13/2024 1525   PROTEINUR 30 (A) 05/13/2024 1525   UROBILINOGEN 0.2 03/16/2015 0120   NITRITE NEGATIVE 05/13/2024 1525   LEUKOCYTESUR MODERATE (A) 05/13/2024 1525   Sepsis  Labs: @LABRCNTIP (procalcitonin:4,lacticidven:4) )No results found for this or any previous visit (from the past 240 hours).   Scheduled Meds:  aspirin  EC  81 mg Oral Q breakfast   atorvastatin   40 mg Oral QHS   cilostazol   100 mg Oral BID   clopidogrel   75 mg Oral q AM   guaiFENesin -dextromethorphan   15 mL Oral Q8H   heparin   5,000 Units Subcutaneous Q8H   insulin  aspart  0-5 Units Subcutaneous QHS   insulin  aspart  0-9 Units Subcutaneous TID WC   insulin  glargine  15 Units Subcutaneous QHS   metoprolol  succinate  25 mg Oral Daily   polyethylene glycol  17 g Oral BID   Continuous Infusions:  sodium chloride  75 mL/hr at 05/13/24 2245   cefTRIAXone  (ROCEPHIN )  IV     lactated ringers       Procedures/Studies: CT Head Wo Contrast Result Date: 05/13/2024 CLINICAL DATA:  Worsening health over the past month with decreased appetite, nighttime confusion, unsteady gait and tremors. EXAM: CT HEAD WITHOUT CONTRAST  TECHNIQUE: Contiguous axial images were obtained from the base of the skull through the vertex without intravenous contrast. RADIATION DOSE REDUCTION: This exam was performed according to the departmental dose-optimization program which includes automated exposure control, adjustment of the mA and/or kV according to patient size and/or use of iterative reconstruction technique. COMPARISON:  03/29/2024 FINDINGS: Brain: Ventricles, cisterns and other CSF spaces are normal. Cavum septum variant is present. Very minimal chronic ischemic microvascular disease. 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 No mass, mass effect, shift of midline structures or acute hemorrhage. No evidence of acute infarction. Vascular: No hyperdense vessel or unexpected calcification. Skull: Normal. Negative for fracture or focal lesion. Sinuses/Orbits: No acute finding. Other: None. IMPRESSION: 1. No acute findings. 2. Very minimal chronic ischemic microvascular disease. 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 Electronically Signed   By: Toribio Agreste M.D.   On: 05/13/2024 15:48   DG Chest Port 1 View Result Date: 05/13/2024 CLINICAL DATA:  Weakness EXAM: PORTABLE CHEST 1 VIEW COMPARISON:  Chest x-ray performed April 14, 2024 FINDINGS: Postsurgical changes from sternotomy. Low lung volumes. Similar appearance of interstitial airspace opacities. Elevated left hemidiaphragm. IMPRESSION: 1. Low lung volumes. 2. Grossly similar accounting for variation in technique. Electronically Signed   By: Maude Naegeli M.D.   On: 05/13/2024 13:21   VAS US  ABI WITH/WO TBI Result Date: 05/13/2024  LOWER EXTREMITY DOPPLER STUDY Patient Name:  LYNK MARTI  Date of Exam:   05/13/2024 Medical Rec #: [id number]     Accession #:    [id number] Date of Birth: [birth year]     Patient Gender: M Patient Age:   31 years Exam Location:  [address] Procedure:      VAS US  ABI WITH/WO TBI Referring Phys: LONNI GASKINS --------------------------------------------------------------------------------  Indications: Peripheral artery disease.  Vascular Interventions: 04/17/24: DCBA right CIA and EIA stents.                         09/22/21: Right CIA and EIA angioplasty with CIA stent.                         08/09/16: Right iliofemoral endarterectomy.                         Bovine pericardial patch angioplasty, right common                         femoral artery.                         Redo right limb of right to left femoral-femoral bypass                         graft. Performing Technologist: King Pierre RVT  Examination Guidelines: A complete evaluation includes at minimum, Doppler waveform signals and systolic blood pressure reading at the level of bilateral brachial, anterior tibial, and posterior tibial arteries, when vessel segments are accessible. Bilateral testing is considered an integral part of a complete examination. Photoelectric Plethysmograph (PPG) waveforms and toe systolic pressure  readings are included as required and additional duplex testing as needed. Limited examinations for reoccurring indications may be performed as noted.  ABI Findings: +---------+------------------+-----+----------+--------+ Right    Rt Pressure (mmHg)IndexWaveform  Comment  +---------+------------------+-----+----------+--------+ Brachial 157                                       +---------+------------------+-----+----------+--------+  PTA      254               1.62 monophasic         +---------+------------------+-----+----------+--------+ DP       85                0.54 monophasic         +---------+------------------+-----+----------+--------+ Great Toe0                 0.00                    +---------+------------------+-----+----------+--------+ +---------+------------------+-----+----------+-------+ Left     Lt Pressure (mmHg)IndexWaveform  Comment +---------+------------------+-----+----------+-------+ Brachial 153                                      +---------+------------------+-----+----------+-------+ PTA      254               1.62 monophasic        +---------+------------------+-----+----------+-------+ DP       254               1.62 monophasic        +---------+------------------+-----+----------+-------+ Great Toe0                 0.00                   +---------+------------------+-----+----------+-------+ +-------+-----------+-----------+------------+------------+ ABI/TBIToday's ABIToday's TBIPrevious ABIPrevious TBI +-------+-----------+-----------+------------+------------+ Right  [place]                    0.7         0.28         +-------+-----------+-----------+------------+------------+ Left   [place]                    [place]          0.18         +-------+-----------+-----------+------------+------------+  Previous ABI on 01/22/24 0.7/0.28 [place]/0.18.  Summary: Right: Resting right ankle-brachial index indicates noncompressible  right lower extremity arteries. The right toe-brachial index is abnormal.  Left: Resting left ankle-brachial index indicates noncompressible left lower extremity arteries. The left toe-brachial index is abnormal.  *See table(s) above for measurements and observations.  Electronically signed by Lonni Gaskins MD on 05/13/2024 at 12:34:42 PM.    Final    VAS US  AORTA/IVC/ILIACS Result Date: 05/13/2024 ABDOMINAL AORTA STUDY Patient Name:  JOSHUE BADAL  Date of Exam:   05/13/2024 Medical Rec #: [id number]     Accession #:    [id number] Date of Birth: 03/18/1946     Patient Gender: M Patient Age:   33 years Exam Location:  [address] Procedure:      VAS US  AORTA/IVC/ILIACS Referring Phys: LONNI GASKINS --------------------------------------------------------------------------------  Vascular Interventions: 04/17/24: DCBA right CIA and EIA stents.                         09/22/21: Right CIA and EIA angioplasty with CIA stent.                         08/09/16: Right iliofemoral endarterectomy.                         Bovine pericardial patch angioplasty, right common  femoral artery.                         Redo right limb of right to left femoral-femoral bypass                         graft.  Comparison Study: 01/22/24: Patent right CIA and EIA stents with elevated                   velocities in the EIA in the > 50% range. Patent right to left                   fem- fem BPG with 50-74% stenosis at both anastomoses. Performing Technologist: King Pierre RVT  Examination Guidelines: A complete evaluation includes B-mode imaging, spectral Doppler, color Doppler, and power Doppler as needed of all accessible portions of each vessel. Bilateral testing is considered an integral part of a complete examination. Limited examinations for reoccurring indications may be performed as noted.  Abdominal Aorta Findings: +-------------+-------+----------+----------+----------+--------+---------+ Location      AP (cm)Trans (cm)PSV (cm/s)Waveform  ThrombusComments  +-------------+-------+----------+----------+----------+--------+---------+ RT CIA Prox                   217       biphasic                    +-------------+-------+----------+----------+----------+--------+---------+ RT CIA Mid                    299       monophasic                  +-------------+-------+----------+----------+----------+--------+---------+ RT CIA Distal                 154       monophasic        turbulent +-------------+-------+----------+----------+----------+--------+---------+ RT EIA Prox                   467       monophasic                  +-------------+-------+----------+----------+----------+--------+---------+ RT EIA Mid                    388       monophasic                  +-------------+-------+----------+----------+----------+--------+---------+ RT EIA Distal                 383       monophasic                  +-------------+-------+----------+----------+----------+--------+---------+ Right Graft #1: +---------------------+--------+--------+----------+--------+ Right to left fem-femPSV cm/sStenosisWaveform  Comments +---------------------+--------+--------+----------+--------+ Inflow               383             monophasic         +---------------------+--------+--------+----------+--------+ Prox Anastomosis     272             monophasic         +---------------------+--------+--------+----------+--------+ Proximal Graft       104             monophasic         +---------------------+--------+--------+----------+--------+ Mid Graft            60  monophasic         +---------------------+--------+--------+----------+--------+ Distal Graft         62              biphasic           +---------------------+--------+--------+----------+--------+ Distal Anastomosis   222             biphasic            +---------------------+--------+--------+----------+--------+ Outflow              367             monophasic         +---------------------+--------+--------+----------+--------+  Summary: Stenosis: Elevated velocities in the CIA with significant elevation in the EIA. Patent right to left fem-fem with inflow stenosis (EIA)and outflow stenosis (SFA).  *See table(s) above for measurements and observations.  Electronically signed by Lonni Gaskins MD on 05/13/2024 at 12:01:43 PM.    Final    PERIPHERAL VASCULAR CATHETERIZATION Result Date: 04/17/2024 Images from the original result were not included.   Patient name: TIGRAN HAYNIE    MRN: [id number]        DOB: 1945/10/11          Sex: male  04/17/2024 Pre-operative Diagnosis: Threatened right to left femoral-femoral bypass with high-grade stenosis in right iliac stents Post-operative diagnosis:  Same Surgeon:  Lonni DOROTHA Gaskins, MD Procedure Performed: 1.  Ultrasound-guided access right to left femorofemoral bypass retrograde 2.  Aortogram with catheter selection of aorta 3.  Drug-coated balloon angioplasty right common and external iliac artery stents (7 mm x 150 mm drug-coated Ranger) 4.  34 minutes of monitored moderate conscious sedation time  Indications: 78 year old male who previously underwent a right to left femoral-femoral bypass by Dr. Dyane years ago.  He had multiple stenting procedures of his right common and external iliac artery.  Recent duplex showed increasing stenosis in the right iliac stents with velocities near 500 suggesting high-grade stenosis.  He presents for lower extremity angiogram with a focus on the femorofemoral bypass and right iliac stents which is the inflow after risks benefits discussed.  Findings:  Ultrasound-guided access right to left femorofemoral bypass retrograde.  Tried to use CO2 but could not get any good imaging.  Angiogram ultimately showed diffuse disease throughout the right common and external iliac stents  with what look like at least 50% stenosis in the proximal external iliac artery near the hypogastric.  Ultimately elected to treat all this with 7 mm x 150 mm drug-coated Ranger for prolonged inflation of 3 minutes.  Widely patent stents at completion.  Patent femorofemoral bypass.  Profunda runoff bilaterally with occluded SFAs.             Procedure:  The patient was identified in the holding area and taken to room 8.  The patient was then placed supine on the table and prepped and draped in the usual sterile fashion.  A time out was called.  Patient received Versed  and fentanyl  for conscious moderate sedation.  Vital signs are monitored including heart rate, respiratory rate, oxygenation blood pressure.  I was present for all of moderate sedation.  Ultrasound was used to evaluate the femoral femoral bypasss, it was patent, an image was saved.  This was accessed with a micro access needle placed a microwire and micro sheath.  I then used a Glidewire advantage and upsized to a short 5 French sheath in the femoral femoral bypass.  The patient was given 100 units/kg  IV heparin .  I then used a KMP catheter to drive the Glidewire advantage retrograde through the right iliac stents and exchanged for an Omni Flush catheter in the abdominal aorta.  Initially tried to use CO2 due to chronic kidney disease.  Due to bowel gas there was no visualization of the iliac stents.  Ultimately elected for contrast injection.  Ultimately as noted above we elected for drug-coated balloon angioplasty and exchanged for a V18 wire up into the aorta through the right iliac stents.  I then used a 7 mm x 150 mm drug-coated Ranger inflated to nominal pressure for 3 minutes.  Final imaging showed widely patent stents.  I did get an additional image of the left groin to look at the outflow of the femorofemoral bypass.  He has profunda runoff bilaterally.  Wires and catheters were removed.  Will be taken to holding for sheath removal.  Plan:  Excellent results after right iliac artery drug-coated balloon angioplasty.  Plan aspirin  statin Plavix .  Arrange follow-up in 1 month.     Lonni DOROTHA Gaskins, MD [facility] Office: [phone]   DG Chest 2 View Result Date: 04/14/2024 CLINICAL DATA:  Right-sided rib fracture.  Evaluate for pneumonia. EXAM: CHEST - 2 VIEW COMPARISON:  Chest x-ray 03/29/2024. FINDINGS: Aorta is ectatic with atherosclerotic calcifications. Cardiac silhouette is within normal limits. Linear opacities in the left mid and lower lungs are unchanged. There is no new focal lung infiltrate, pleural effusion or pneumothorax. There are healed/healing right fifth, sixth and seventh rib fractures. Sternotomy wires are present. IMPRESSION: 1. No acute cardiopulmonary process. 2. Healed/healing right fifth, sixth and seventh rib fractures. Electronically Signed   By: Greig Pique M.D.   On: 04/14/2024 21:29    Alm Schneider, DO  [facility]  If 7PM-7AM, please contact night-coverage [email or web address] Password [email or web address] 05/14/2024, 8:08 AM   LOS: 1 day

## 2024-05-14 NOTE — Hospital Course (Addendum)
 78 year old male with history of coronary artery disease status post CABG, diabetes mellitus type 2, hypertension, hyperlipidemia, CKD stage IV, COPD, peripheral arterial disease, atrial tachycardia, diverticulitis status post colostomy and subsequent takedown, TIA presenting with generalized weakness, confusion, decreased oral intake, and difficulty ambulating.  The patient was at the vascular surgery office when he was noted to have the above symptoms.  Family noted the patient has had general decline over the past 2 months.  He has had poor oral intake for a few weeks without any vomiting or diarrhea.  There has been no complaints of chest pain or abdominal pain.  Family has noted increasing confusion over the past few days prior to admission. Notably, the patient had a hospital admission from 03/21/2024 to 03/23/2024 during which time he was treated for community-acquired pneumonia and acute on chronic renal failure. In the ED, the patient was afebrile and hemodynamically stable with oxygen saturation 93-95% room air.  WBC 16.7, hemoglobin 9.1, platelets 544.  Sodium 132, potassium 4.6, bicarbonate 16, serum creatinine 5.48.  LFTs were unremarkable.  Troponin 129>>108.  Ammonia <13.  Lactic acid 2.6>>1.6 TSH 2.250.  CT brain was negative for any acute findings.  Chest x-ray showed chronic interstitial markings.  UA >50 WBC the patient was started on IV ceftriaxone  and IV fluids.  Review of the medical record shows that the patient has had intermittent episodes of delirium related to metabolic causes.  He has usually bounced back after treatment of his metabolic issues in the past.  During this hospitalization, the patient's mental status has gradually declined despite optimal treatment for his metabolic causes. Review of the medical record shows the patient has had enhancing lesions in his lumbar and thoracic spine on MRI (12/07/23).  PET scan (12/27/23) also showed Multifocal mild areas of abnormal uptake  along the skeleton including several ribs, vertebral bodies and scattered along the pelvis. Some of these lesions are associated with areas of bony sclerosis.  He had separate T9 and L3 bone biopsies without any metastatic carcinoma identified.  He has seen medical oncology and has undergone extensive workup including serum immunoglobulins and SPEP which have been negative.  In addition, tumor markers CEA, CA 19-9, alpha-fetoprotein, and PSA were all unremarkable. During this hospitalization, wife stated she has noticed some shaking in his right arm and right chest intermittently.   During this hospitalization, the patient has also developed proctitis.  He was started on antibiotics with Zyvox and Unasyn.  Even with optimization of his metabolic abnormalities, his mental status continued to decline.  Case was discussed with neurology, Dr. Arora.  I was concerned about the patient needing an EEG to rule out focal seizure.  Certainly the patient may have myoclonus from his metabolic abnormalities.  In addition, the patient may need LP to rule out paraneoplastic syndrome.  Dr. Arora agreed for consultation after the patient was transferred.

## 2024-05-14 NOTE — Progress Notes (Signed)
 Initial Nutrition Assessment  DOCUMENTATION CODES:   Not applicable  INTERVENTION:   -Liberalize diet to carb modified for wider variety of meal selections -Glucerna Shake po TID, each supplement provides 220 kcal and 10 grams of protein  -MVI with minerals daily  NUTRITION DIAGNOSIS:   Increased nutrient needs related to acute illness as evidenced by estimated needs.  GOAL:   Patient will meet greater than or equal to 90% of their needs  MONITOR:   PO intake, Supplement acceptance  REASON FOR ASSESSMENT:   Malnutrition Screening Tool    ASSESSMENT:   Pt with history of coronary artery disease status post CABG, diabetes mellitus type 2, hypertension, hyperlipidemia, CKD stage IV, COPD, peripheral arterial disease, atrial tachycardia, diverticulitis status post colostomy and subsequent takedown, TIA presenting with generalized weakness, confusion, decreased oral intake, and difficulty ambulating.  Pt admitted with acute metabolic encephalopathy, UTI, and CKD 4.   Reviewed I/O's: +1.8 L x 24 hours  UOP: 150 ml x 24 hours  Pt unavailable at time of visit. Attempted to speak with pt via call to hospital room phone, however, unable to reach. RD unable to obtain further nutrition-related history or complete nutrition-focused physical exam at this time.    Per H&P, pt family reports general decline in health over the past 2 months secondary to above symptoms. Additionally pt has had decreased oral intake for the past few weeks PTA and increasing confusing for the past 2 days PTA.   Pt experienced fall on 03/03/24 with subsequent rib fractures. He has hospitalized with pneumonia from 8/8-8/10/25.   Case discussed in interdisciplinary rounds.   Case discussed with RN, who reports pt ate well at breakfast this AM. He is currently on a heart healthy, carb modified diet.   Per TOC, potential for SNF placement.   Wt has been stable over the past 3 months.   Medications  reviewed.   Lab Results  Component Value Date   HGBA1C 9.0 (H) 05/13/2024   PTA DM medications are 24 units insulin  glargine daily.   Labs reviewed: CBGS: 106-263 (inpatient orders for glycemic control are 0-5 units insulin  aspart daily at bedtime, 0-9 units insulin  aspart TID with meals, and 15 units insulin  glargine daily at bedtime).    Diet Order:   Diet Order             Diet heart healthy/carb modified Fluid consistency: Thin  Diet effective now                   EDUCATION NEEDS:   No education needs have been identified at this time  Skin:  Skin Assessment: Reviewed RN Assessment  Last BM:  06/11/24  Height:   Ht Readings from Last 1 Encounters:  05/13/24 5' 7 (1.702 m)    Weight:   Wt Readings from Last 1 Encounters:  05/13/24 62.6 kg    Ideal Body Weight:  67.3 kg  BMI:  Body mass index is 21.62 kg/m.  Estimated Nutritional Needs:   Kcal:  1700-1900  Protein:  90-105 grams  Fluid:  1.7-1.9 L    Margery ORN, RD, LDN, CDCES Registered Dietitian III Certified Diabetes Care and Education Specialist If unable to reach this RD, please use RD Inpatient group chat on secure chat between hours of 8am-4 pm daily

## 2024-05-14 NOTE — Plan of Care (Signed)
  Problem: Acute Rehab PT Goals(only PT should resolve) Goal: Patient Will Transfer Sit To/From Stand Outcome: Progressing Flowsheets (Taken 05/14/2024 1118) Patient will transfer sit to/from stand: with supervision Goal: Pt Will Transfer Bed To Chair/Chair To Bed Outcome: Progressing Flowsheets (Taken 05/14/2024 1118) Pt will Transfer Bed to Chair/Chair to Bed: with supervision Goal: Pt Will Ambulate Outcome: Progressing Flowsheets (Taken 05/14/2024 1118) Pt will Ambulate:  50 feet  with supervision  with least restrictive assistive device   Lacinda Fass, PT, DPT

## 2024-05-14 NOTE — [place] FL2 (Signed)
   MEDICAID FL2 LEVEL OF CARE FORM     IDENTIFICATION  Patient Name: [name] Birthdate: 06/09/1946 Sex: male Admission Date (Current Location): 05/13/2024  [facility] and [place] Number:  [employer] and Address:  [facility],  [address] [place] [address]      Provider Number: [id number]  Attending Physician Name and Address:  Evonnie Lenis, MD  Relative Name and Phone Number:       Current Level of Care: Hospital Recommended Level of Care: Skilled Nursing Facility Prior Approval Number:    Date Approved/Denied:   PASRR Number:    Discharge Plan: SNF    Current Diagnoses: Patient Active Problem List   Diagnosis Date Noted   Failure to thrive in adult 05/14/2024   Cystitis 05/14/2024   Leukocytosis 04/12/2024   Pneumonia, organism unspecified(486) 03/21/2024   Acute encephalopathy 01/26/2024   Iron  deficiency anemia 01/23/2024   Chronic kidney disease (CKD), stage IV (severe) (HCC) 01/13/2024   Coffee ground emesis 01/01/2024   Acute esophagitis 01/01/2024   Acute respiratory failure with hypoxia (HCC) 01/01/2024   Acute renal failure superimposed on stage 4 chronic kidney disease (HCC) 12/29/2023   Metastatic cancer to bone (HCC) 12/18/2023   Syncope and collapse 12/10/2023   Hypocalcemia 12/10/2023   Anemia 12/10/2023   Lytic bone lesions on xray 12/08/2023   Multifocal pneumonia 12/07/2023   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   H/O adenomatous polyp of colon 11/22/2022   Aortic stenosis 05/30/2022   Coronary artery disease involving coronary bypass graft of native heart without angina pectoris 11/15/2021   Pain and swelling of right elbow 04/23/2021   Fall    Closed nondisplaced intertrochanteric fracture of left femur (HCC)    Closed left hip fracture, initial encounter (HCC) 04/01/2021   CAP (community acquired pneumonia) 05/21/2019   Diarrhea 03/18/2019   Loss of weight 03/18/2019   Klebsiella sepsis (HCC)  09/15/2018   Urinary tract infection without hematuria    Acute lower UTI 09/13/2018   ABLA (acute blood loss anemia) 03/27/2018   S/P colostomy takedown 03/26/2018   Hypertension 12/12/2017   Type 2 diabetes mellitus with diabetic neuropathy (HCC) 12/12/2017   Hypercholesteremia 12/12/2017   Hypokalemia 12/12/2017   GI bleed 12/06/2017   Rectal bleed 12/06/2017   Uncontrolled type 2 diabetes mellitus with hyperglycemia, with long-term current use of insulin  (HCC) 12/06/2017   Acute renal failure superimposed on stage 3 chronic kidney disease (HCC) 12/06/2017   Malnutrition of moderate degree 12/06/2017   Abnormal CT scan, colon 11/23/2017   Constipation 11/23/2017   Pneumonia of left upper lobe due to infectious organism 10/02/2017   Diverticulitis large intestine 08/19/2017   Diverticulitis of large intestine with perforation and abscess without bleeding    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 08/16/2017   CKD (chronic kidney disease) stage 3, GFR 30-59 ml/min (HCC) 08/02/2017   Sepsis due to undetermined organism (HCC) 08/01/2017   GERD (gastroesophageal reflux disease) 08/01/2017   PAD (peripheral artery disease) 08/09/2016   Claudication 05/08/2016   HCAP (healthcare-associated pneumonia) 01/12/2016   Hyponatremia 01/12/2016   Abnormal chest CT 10/13/2015   Hx of CABG 10/13/2015   Type 2 diabetes, HbA1c goal < 7% (HCC)    Unstable angina (HCC)    Hypomagnesemia    Type 2 diabetes mellitus (HCC) 10/09/2015   ASCVD (arteriosclerotic cardiovascular disease) 10/09/2015   Sepsis (HCC) 07/09/2015   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 07/09/2015   PVD (peripheral vascular disease) 07/09/2015  Essential hypertension 07/09/2015   Orthostatic hypotension 07/09/2015   Neuropathy, peripheral 07/09/2015   HLD (hyperlipidemia) 07/09/2015   AKI (acute kidney injury) 07/09/2015   CAD S/P percutaneous coronary angioplasty 08/14/1996    Orientation RESPIRATION BLADDER  Height & Weight     Self, Time, Situation, Place  Normal Continent Weight: 138 lb 0.1 oz (62.6 kg) Height:  5' 7 (170.2 cm)  BEHAVIORAL SYMPTOMS/MOOD NEUROLOGICAL BOWEL NUTRITION STATUS      Continent Diet  AMBULATORY STATUS COMMUNICATION OF NEEDS Skin   Extensive Assist Verbally Normal                       Personal Care Assistance Level of Assistance  Bathing, Feeding, Dressing Bathing Assistance: Limited assistance Feeding assistance: Independent Dressing Assistance: Limited assistance     Functional Limitations Info  Sight, Hearing, Speech Sight Info: Impaired Hearing Info: Adequate Speech Info: Adequate    SPECIAL CARE FACTORS FREQUENCY  PT (By licensed PT), OT (By licensed OT)     PT Frequency: 5 times weekly OT Frequency: 5 times weekly            Contractures Contractures Info: Not present    Additional Factors Info  Code Status, Allergies Code Status Info: FULL Allergies Info: Baclofen and Acarbose           Current Medications (05/14/2024):  This is the current hospital active medication list Current Facility-Administered Medications  Medication Dose Route Frequency Provider Last Rate Last Admin   0.9 %  sodium chloride  infusion   Intravenous Continuous Tat, David, MD 75 mL/hr at 05/14/24 1112 New Bag at 05/14/24 1112   acetaminophen  (TYLENOL ) tablet 650 mg  650 mg Oral Q6H PRN Emokpae, Ejiroghene E, MD       Or   acetaminophen  (TYLENOL ) suppository 650 mg  650 mg Rectal Q6H PRN Emokpae, Ejiroghene E, MD       aspirin  EC tablet 81 mg  81 mg Oral Q breakfast Emokpae, Ejiroghene E, MD   81 mg at 05/14/24 9176   atorvastatin  (LIPITOR ) tablet 40 mg  40 mg Oral QHS Emokpae, Ejiroghene E, MD   40 mg at 05/13/24 2241   cefTRIAXone  (ROCEPHIN ) 1 g in sodium chloride  0.9 % 100 mL IVPB  1 g Intravenous Q24H Emokpae, Ejiroghene E, MD       cilostazol  (PLETAL ) tablet 100 mg  100 mg Oral BID Emokpae, Ejiroghene E, MD   100 mg at 05/14/24 9176   clopidogrel   (PLAVIX ) tablet 75 mg  75 mg Oral q AM Emokpae, Ejiroghene E, MD   75 mg at 05/14/24 0612   guaiFENesin -dextromethorphan  (ROBITUSSIN DM) 100-10 MG/5ML syrup 15 mL  15 mL Oral Q8H Emokpae, Ejiroghene E, MD   15 mL at 05/14/24 0611   heparin  injection 5,000 Units  5,000 Units Subcutaneous Q8H Emokpae, Ejiroghene E, MD   5,000 Units at 05/14/24 0611   insulin  aspart (novoLOG ) injection 0-5 Units  0-5 Units Subcutaneous QHS Emokpae, Ejiroghene E, MD   3 Units at 05/13/24 2242   insulin  aspart (novoLOG ) injection 0-9 Units  0-9 Units Subcutaneous TID WC Emokpae, Ejiroghene E, MD   2 Units at 05/14/24 1112   insulin  glargine (LANTUS ) injection 15 Units  15 Units Subcutaneous QHS Emokpae, Ejiroghene E, MD   15 Units at 05/13/24 2241   lactated ringers  bolus 1,000 mL  1,000 mL Intravenous Once Rigney, Christopher D, PA-C       metoprolol  succinate (TOPROL -XL) 24 hr tablet 25 mg  25 mg Oral Daily Emokpae, Ejiroghene E, MD   25 mg at 05/14/24 9176   polyethylene glycol (MIRALAX  / GLYCOLAX ) packet 17 g  17 g Oral BID Emokpae, Ejiroghene E, MD   17 g at 05/14/24 9176   promethazine  (PHENERGAN ) tablet 12.5 mg  12.5 mg Oral Q6H PRN Emokpae, Ejiroghene E, MD         Discharge Medications: Please see discharge summary for a list of discharge medications.  Relevant Imaging Results:  Relevant Lab Results:   Additional Information SSN: [id number] LCSWA

## 2024-05-14 NOTE — Plan of Care (Signed)
  Problem: Health Behavior/Discharge Planning: Goal: Ability to manage health-related needs will improve Outcome: Progressing   Problem: Clinical Measurements: Goal: Ability to maintain clinical measurements within normal limits will improve Outcome: Progressing Goal: Will remain free from infection Outcome: Progressing Goal: Diagnostic test results will improve Outcome: Progressing Goal: Respiratory complications will improve Outcome: Progressing Goal: Cardiovascular complication will be avoided Outcome: Progressing   Problem: Activity: Goal: Risk for activity intolerance will decrease Outcome: Progressing   Problem: Nutrition: Goal: Adequate nutrition will be maintained Outcome: Progressing   Problem: Coping: Goal: Level of anxiety will decrease Outcome: Progressing   Problem: Safety: Goal: Ability to remain free from injury will improve Outcome: Progressing   Problem: Coping: Goal: Ability to adjust to condition or change in health will improve Outcome: Progressing   Problem: Fluid Volume: Goal: Ability to maintain a balanced intake and output will improve Outcome: Progressing   Problem: Health Behavior/Discharge Planning: Goal: Ability to identify and utilize available resources and services will improve Outcome: Progressing Goal: Ability to manage health-related needs will improve Outcome: Progressing   Problem: Metabolic: Goal: Ability to maintain appropriate glucose levels will improve Outcome: Progressing   Problem: Nutritional: Goal: Maintenance of adequate nutrition will improve Outcome: Progressing Goal: Progress toward achieving an optimal weight will improve Outcome: Progressing   Problem: Tissue Perfusion: Goal: Adequacy of tissue perfusion will improve Outcome: Progressing

## 2024-05-14 NOTE — Plan of Care (Signed)
   Problem: Education: Goal: Knowledge of General Education information will improve Description Including pain rating scale, medication(s)/side effects and non-pharmacologic comfort measures Outcome: Progressing

## 2024-05-14 NOTE — TOC Initial Note (Addendum)
 Transition of Care [facility]) - Initial/Assessment Note    Patient Details  Name: [name] MRN: [id number] Date of Birth: 1946-02-11  [facility] CM/SW Contact:    Lucie Lunger, LCSWA Phone Number: 05/14/2024, 10:57 AM  Clinical Narrative:                 Pt is high risk for readmission. CSW spoke with pt and daughter at bedside to complete assessment. Pt lives with his spouse. Normally pt is independent in completing his ADLs and drives to appointments when needed. Pts daughter states that they are interested in [facility]. CSW also updated PT is recommending SNF at this time, pts daughter states they are agreeable to SNF referral but prefer pt return home with [facility] if walking better prior to D/C. CSW to send out SNF referral locally for review.   [facility] notified of pts [facility] admission. [phone]. TOC to follow.   Expected Discharge Plan: Skilled Nursing Facility Barriers to Discharge: Continued Medical Work up   Patient Goals and CMS Choice Patient states their goals for this hospitalization and ongoing recovery are:: get stronger CMS Medicare.gov Compare Post Acute Care list provided to:: Patient Choice offered to / list presented to : Patient, Adult Children [place] ownership interest in [facility].provided to:: Adult Children    Expected Discharge Plan and Services In-house Referral: Clinical Social Work Discharge Planning Services: CM Consult Post Acute Care Choice: Skilled Nursing Facility Living arrangements for the past 2 months: Single Family Home                                      Prior Living Arrangements/Services Living arrangements for the past 2 months: Single Family Home Lives with:: Spouse Patient language and need for interpreter reviewed:: Yes Do you feel safe going back to the place where you live?: Yes      Need for Family Participation in Patient Care: Yes (Comment) Care giver support system in place?: Yes  (comment) Current home services: DME Criminal Activity/Legal Involvement Pertinent to Current Situation/Hospitalization: No - Comment as needed  Activities of Daily Living   ADL Screening (condition at time of admission) Independently performs ADLs?: Yes (appropriate for developmental age) Is the patient deaf or have difficulty hearing?: No Does the patient have difficulty seeing, even when wearing glasses/contacts?: No Does the patient have difficulty concentrating, remembering, or making decisions?: Yes  Permission Sought/Granted                  Emotional Assessment Appearance:: Appears stated age Attitude/Demeanor/Rapport: Engaged Affect (typically observed): Accepting Orientation: : Oriented to Self, Oriented to Place, Oriented to  Time, Oriented to Situation Alcohol  / Substance Use: Not Applicable Psych Involvement: No (comment)  Admission diagnosis:  Acute cystitis without hematuria [N30.00] Acute kidney injury [N17.9] Acute-on-chronic kidney injury [N17.9, N18.9] Sepsis, due to unspecified organism, unspecified whether acute organ dysfunction present [facility]) [A41.9] Patient Active Problem List   Diagnosis Date Noted   Failure to thrive in adult 05/14/2024   Cystitis 05/14/2024   Leukocytosis 04/12/2024   Pneumonia, organism unspecified(486) 03/21/2024   Acute encephalopathy 01/26/2024   Iron  deficiency anemia 01/23/2024   Chronic kidney disease (CKD), stage IV (severe) (HCC) 01/13/2024   Coffee ground emesis 01/01/2024   Acute esophagitis 01/01/2024   Acute respiratory failure with hypoxia (HCC) 01/01/2024   Acute renal failure superimposed on stage 4 chronic kidney disease (  HCC) 12/29/2023   Metastatic cancer to bone (HCC) 12/18/2023   Syncope and collapse 12/10/2023   Hypocalcemia 12/10/2023   Anemia 12/10/2023   Lytic bone lesions on xray 12/08/2023   Multifocal pneumonia 12/07/2023   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   H/O adenomatous polyp of colon  11/22/2022   Aortic stenosis 05/30/2022   Coronary artery disease involving coronary bypass graft of native heart without angina pectoris 11/15/2021   Pain and swelling of right elbow 04/23/2021   Fall    Closed nondisplaced intertrochanteric fracture of left femur (HCC)    Closed left hip fracture, initial encounter (HCC) 04/01/2021   CAP (community acquired pneumonia) 05/21/2019   Diarrhea 03/18/2019   Loss of weight 03/18/2019   Klebsiella sepsis (HCC) 09/15/2018   Urinary tract infection without hematuria    Acute lower UTI 09/13/2018   ABLA (acute blood loss anemia) 03/27/2018   S/P colostomy takedown 03/26/2018   Hypertension 12/12/2017   Type 2 diabetes mellitus with diabetic neuropathy (HCC) 12/12/2017   Hypercholesteremia 12/12/2017   Hypokalemia 12/12/2017   GI bleed 12/06/2017   Rectal bleed 12/06/2017   Uncontrolled type 2 diabetes mellitus with hyperglycemia, with long-term current use of insulin  (HCC) 12/06/2017   Acute renal failure superimposed on stage 3 chronic kidney disease (HCC) 12/06/2017   Malnutrition of moderate degree 12/06/2017   Abnormal CT scan, colon 11/23/2017   Constipation 11/23/2017   Pneumonia of left upper lobe due to infectious organism 10/02/2017   Diverticulitis large intestine 08/19/2017   Diverticulitis of large intestine with perforation and abscess without bleeding    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 08/16/2017   CKD (chronic kidney disease) stage 3, GFR 30-59 ml/min (HCC) 08/02/2017   Sepsis due to undetermined organism (HCC) 08/01/2017   GERD (gastroesophageal reflux disease) 08/01/2017   PAD (peripheral artery disease) 08/09/2016   Claudication 05/08/2016   HCAP (healthcare-associated pneumonia) 01/12/2016   Hyponatremia 01/12/2016   Abnormal chest CT 10/13/2015   Hx of CABG 10/13/2015   Type 2 diabetes, HbA1c goal < 7% (HCC)    Unstable angina (HCC)    Hypomagnesemia    Type 2 diabetes mellitus (HCC)  10/09/2015   ASCVD (arteriosclerotic cardiovascular disease) 10/09/2015   Sepsis (HCC) 07/09/2015   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 07/09/2015   PVD (peripheral vascular disease) 07/09/2015   Essential hypertension 07/09/2015   Orthostatic hypotension 07/09/2015   Neuropathy, peripheral 07/09/2015   HLD (hyperlipidemia) 07/09/2015   AKI (acute kidney injury) 07/09/2015   CAD S/P percutaneous coronary angioplasty 08/14/1996   PCP:  Clinic, [facility] Pharmacy:   [facility] [address] [place] - [address] [place] [address] Phone: [phone] Fax: [phone]  [facility] - [place], [place] - [address] [place] [address] Phone: [phone] Fax: [phone]     Social Drivers of Health (SDOH) Social History: SDOH Screenings   Food Insecurity: No Food Insecurity (05/14/2024)  Housing: Low Risk  (05/13/2024)  Transportation Needs: No Transportation Needs (05/13/2024)  Utilities: Not At Risk (05/13/2024)  Depression (PHQ2-9): Low Risk  (04/10/2024)  Financial Resource Strain: Low Risk  (10/08/2023)   Received from [facility]  Social Connections: Socially Integrated (05/14/2024)  Tobacco Use: Medium Risk (05/13/2024)  Health Literacy: High Risk (10/08/2023)   Received from [facility]   SDOH Interventions:     Readmission Risk Interventions    05/14/2024   10:56 AM 03/22/2024  4:21 PM 01/29/2024   11:17 AM  Readmission Risk Prevention Plan  Transportation Screening Complete Complete Complete  PCP or Specialist Appt within 3-5 Days   Complete  HRI or Home Care Consult   Complete  Palliative Care Screening   Not Applicable  Medication Review (RN Care Manager) Complete Complete Complete  PCP or Specialist appointment within 3-5 days of discharge  Complete   HRI or Home Care Consult Complete Complete   SW Recovery Care/Counseling Consult Complete Complete    Palliative Care Screening Not Applicable Not Applicable   Skilled Nursing Facility Complete Not Applicable

## 2024-05-14 NOTE — Evaluation (Signed)
 Physical Therapy Evaluation Patient Details Name: [name] MRN: [id number] DOB: 1946-01-13 Today's Date: 05/14/2024  History of Present Illness  [name] is a 78 y.o. male with medical history significant for CABG, hypertension, CKD 3, diabetes mellitus.  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  On my evaluation, patient is awake, mildly lethargic, able to answer restraints but a bit confused.  Patient's spouse, daughter and grandson at bedside.  Patient has mostly had confusion at night over the past several days, but today he has been confused all day.  He has had poor oral intake over the past few weeks.  No vomiting no diarrhea, no complaints of chest or abdominal pain.  He has been generally weak and unable to ambulate.  Today they noticed tremors.     Patient had a fall with subsequent rib fractures 7/21 and was subsequently diagnosed with a pneumonia, and hence hospitalized 8/8 to 8/10 and treated with a course of antibiotics.  He started another course of Augmentin /doxycycline  9/22 - reports he just completed it 2 days ago for pneumonia.   Clinical Impression  Pt was agreeable to completing today's PT evaluation. He required minA with sit to stand transfers secondary to lethargy and deconditioning. However, his lethargy improved once standing and ambulating and he was able to ambulate approximately 25 feet with a RW and minA. He also required minA with step pivot transfers from the bed to the chair. He was left in bed with the call bell within reach, bed alarm activated, and family present. Patient will benefit from continued skilled physical therapy in hospital and recommended venue below to increase strength, balance, endurance for safe ADLs and gait.       If plan is discharge home, recommend the following: A little help with walking  and/or transfers;A little help with bathing/dressing/bathroom;Assistance with cooking/housework;Assist for transportation;Help with stairs or ramp for entrance;Supervision due to cognitive status   Can travel by private vehicle   Yes    Equipment Recommendations None recommended by PT  Recommendations for Other Services       Functional Status Assessment Patient has had a recent decline in their functional status and demonstrates the ability to make significant improvements in function in a reasonable and predictable amount of time.     Precautions / Restrictions Precautions Precautions: Fall Recall of Precautions/Restrictions: Impaired Restrictions Weight Bearing Restrictions Per Provider Order: No      Mobility  Bed Mobility Overal bed mobility: Needs Assistance Bed Mobility: Supine to Sit     Supine to sit: Supervision, HOB elevated          Transfers Overall transfer level: Needs assistance Equipment used: Rolling walker (2 wheels) Transfers: Sit to/from Stand, Bed to chair/wheelchair/BSC Sit to Stand: Min assist   Step pivot transfers: Min assist            Ambulation/Gait Ambulation/Gait assistance: Min assist Gait Distance (Feet): 25 Feet Assistive device: Rolling walker (2 wheels) Gait Pattern/deviations: Step-through pattern, Decreased stride length Gait velocity: decreased     General Gait Details: poor foot clearance bilaterally  Stairs            Wheelchair Mobility     Tilt Bed    Modified Rankin (Stroke Patients Only)       Balance Overall balance assessment: Needs assistance Sitting-balance support: No upper extremity supported, Feet supported Sitting  balance-Leahy Scale: Fair Sitting balance - Comments: seated EOB   Standing balance support: Bilateral upper extremity supported, During functional activity, Reliant on assistive device for balance Standing balance-Leahy Scale: Poor Standing balance comment: with RW                              Pertinent Vitals/Pain Pain Assessment Pain Assessment: No/denies pain    Home Living Family/patient expects to be discharged to:: Private residence Living Arrangements: Spouse/significant other Available Help at Discharge: Family;Available 24 hours/day Type of Home: House Home Access: Stairs to enter Entrance Stairs-Rails: Right;Left Entrance Stairs-Number of Steps: 5 in front; 3 in back   Home Layout: One level Home Equipment: Agricultural consultant (2 wheels);Shower seat;Cane - single point;Grab bars - toilet;Grab bars - tub/shower      Prior Function Prior Level of Function : Independent/Modified Independent             Mobility Comments: independent without AD ADLs Comments: independent     Extremity/Trunk Assessment   Upper Extremity Assessment Upper Extremity Assessment: Generalized weakness    Lower Extremity Assessment Lower Extremity Assessment: Generalized weakness    Cervical / Trunk Assessment Cervical / Trunk Assessment: Kyphotic  Communication   Communication Communication: No apparent difficulties    Cognition Arousal: Lethargic Behavior During Therapy: WFL for tasks assessed/performed   PT - Cognitive impairments: Orientation   Orientation impairments: Place                     Following commands: Intact       Cueing Cueing Techniques: Verbal cues, Tactile cues     General Comments      Exercises     Assessment/Plan    PT Assessment Patient needs continued PT services  PT Problem List Decreased strength;Decreased activity tolerance;Decreased balance;Decreased mobility       PT Treatment Interventions      PT Goals (Current goals can be found in the Care Plan section)  Acute Rehab PT Goals Patient Stated Goal: pt would like to return home PT Goal Formulation: With patient/family Time For Goal Achievement: 05/28/24 Potential to Achieve Goals: Good    Frequency Min 3X/week      Co-evaluation               AM-PAC PT 6 Clicks Mobility  Outcome Measure Help needed turning from your back to your side while in a flat bed without using bedrails?: A Little Help needed moving from lying on your back to sitting on the side of a flat bed without using bedrails?: A Little Help needed moving to and from a bed to a chair (including a wheelchair)?: A Lot Help needed standing up from a chair using your arms (e.g., wheelchair or bedside chair)?: A Little Help needed to walk in hospital room?: A Lot Help needed climbing 3-5 steps with a railing? : A Lot 6 Click Score: 15    End of Session Equipment Utilized During Treatment: Gait belt Activity Tolerance: Patient tolerated treatment well Patient left: in bed;with call bell/phone within reach;with bed alarm set;with family/visitor present Nurse Communication: Mobility status;Precautions PT Visit Diagnosis: Unsteadiness on feet (R26.81);Muscle weakness (generalized) (M62.81);Difficulty in walking, not elsewhere classified (R26.2)    Time: 9147-9069 PT Time Calculation (min) (ACUTE ONLY): 38 min   Charges:   PT Evaluation $PT Eval Moderate Complexity: 1 Mod PT Treatments $Therapeutic Activity: 23-37 mins PT General Charges $$ ACUTE PT VISIT: 1 Visit  Lacinda Fass, PT, DPT  05/14/2024, 11:07 AM

## 2024-05-14 DEATH — deceased

## 2024-05-15 DIAGNOSIS — N3090 Cystitis, unspecified without hematuria: Secondary | ICD-10-CM

## 2024-05-15 DIAGNOSIS — G9340 Encephalopathy, unspecified: Secondary | ICD-10-CM | POA: Diagnosis not present

## 2024-05-15 DIAGNOSIS — N178 Other acute kidney failure: Secondary | ICD-10-CM

## 2024-05-15 DIAGNOSIS — N184 Chronic kidney disease, stage 4 (severe): Secondary | ICD-10-CM

## 2024-05-15 DIAGNOSIS — R627 Adult failure to thrive: Secondary | ICD-10-CM

## 2024-05-15 LAB — BASIC METABOLIC PANEL WITH GFR
Anion gap: 15 (ref 5–15)
BUN: 49 mg/dL — ABNORMAL HIGH (ref 8–23)
CO2: 18 mmol/L — ABNORMAL LOW (ref 22–32)
Calcium: 9.5 mg/dL (ref 8.9–10.3)
Chloride: 104 mmol/L (ref 98–111)
Creatinine, Ser: 3.32 mg/dL — ABNORMAL HIGH (ref 0.61–1.24)
GFR, Estimated: 18 mL/min — ABNORMAL LOW (ref >60)
Glucose, Bld: 77 mg/dL (ref 70–99)
Potassium: 4.3 mmol/L (ref 3.5–5.1)
Sodium: 137 mmol/L (ref 135–145)

## 2024-05-15 LAB — CBC
HCT: 27.8 % — ABNORMAL LOW (ref 39.0–52.0)
Hemoglobin: 8.4 g/dL — ABNORMAL LOW (ref 13.0–17.0)
MCH: 24.6 pg — ABNORMAL LOW (ref 26.0–34.0)
MCHC: 30.2 g/dL (ref 30.0–36.0)
MCV: 81.5 fL (ref 80.0–100.0)
Platelets: 449 K/uL — ABNORMAL HIGH (ref 150–400)
RBC: 3.41 MIL/uL — ABNORMAL LOW (ref 4.22–5.81)
RDW: 19.5 % — ABNORMAL HIGH (ref 11.5–15.5)
WBC: 15.5 K/uL — ABNORMAL HIGH (ref 4.0–10.5)
nRBC: 0 % (ref 0.0–0.2)

## 2024-05-15 LAB — GLUCOSE, CAPILLARY
Glucose-Capillary: 78 mg/dL (ref 70–99)
Glucose-Capillary: 158 mg/dL — ABNORMAL HIGH (ref 70–99)
Glucose-Capillary: 205 mg/dL — ABNORMAL HIGH (ref 70–99)
Glucose-Capillary: 178 mg/dL — ABNORMAL HIGH (ref 70–99)

## 2024-05-15 LAB — MAGNESIUM: Magnesium: 2.3 mg/dL (ref 1.7–2.4)

## 2024-05-15 MED ORDER — SODIUM CHLORIDE 0.9 % IV SOLN: INTRAVENOUS | Status: AC

## 2024-05-15 MED ORDER — ENSURE PLUS HIGH PROTEIN PO LIQD
237.0000 mL | Freq: Two times a day (BID) | ORAL | Status: DC
  Administered 2024-05-15 – 2024-05-16 (×4): 237 mL via ORAL

## 2024-05-15 NOTE — Progress Notes (Signed)
 Physical Therapy Treatment Patient Details Name: [name] MRN: [id number] DOB: Mar 23, 1946 Today's Date: 05/15/2024   History of Present Illness [name] is a 78 y.o. male with medical history significant for CABG, hypertension, CKD 3, diabetes mellitus.  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  On my evaluation, patient is awake, mildly lethargic, able to answer restraints but a bit confused.  Patient's spouse, daughter and grandson at bedside.  Patient has mostly had confusion at night over the past several days, but today he has been confused all day.  He has had poor oral intake over the past few weeks.  No vomiting no diarrhea, no complaints of chest or abdominal pain.  He has been generally weak and unable to ambulate.  Today they noticed tremors.     Patient had a fall with subsequent rib fractures 7/21 and was subsequently diagnosed with a pneumonia, and hence hospitalized 8/8 to 8/10 and treated with a course of antibiotics.  He started another course of Augmentin /doxycycline  9/22 - reports he just completed it 2 days ago for pneumonia.    PT Comments  Pt was agreeable to today's PT treatment. Today's treatment focused on improved functional mobility and ambulation. He was able to exhibit improved cognition and mobility since his initial evaluation. He was was able to transfer and ambulate with supervision while utilizing a RW. He was able to demonstrate improved gait speed and stride length, but required minimal cueing to keep the RW close to his body and keep it within his base of support. He was also introduced to seated interventions for improved lower extremity strength and mobility with minimal cueing. He was left in the chair with the call bell within reach, chair alarm set, and family present. Patient will benefit from continued  skilled physical therapy in hospital and recommended venue below to increase strength, balance, endurance for safe ADLs and gait.     If plan is discharge home, recommend the following: A little help with walking and/or transfers;A little help with bathing/dressing/bathroom;Assistance with cooking/housework;Assist for transportation;Help with stairs or ramp for entrance;Supervision due to cognitive status   Can travel by private vehicle     Yes  Equipment Recommendations       Recommendations for Other Services       Precautions / Restrictions Precautions Precautions: Fall Recall of Precautions/Restrictions: Intact Restrictions Weight Bearing Restrictions Per Provider Order: No     Mobility  Bed Mobility Overal bed mobility: Needs Assistance Bed Mobility: Supine to Sit     Supine to sit: Supervision          Transfers Overall transfer level: Needs assistance Equipment used: Rolling walker (2 wheels) Transfers: Sit to/from Stand, Bed to chair/wheelchair/BSC Sit to Stand: Supervision   Step pivot transfers: Supervision            Ambulation/Gait Ambulation/Gait assistance: Supervision Gait Distance (Feet): 65 Feet Assistive device: Rolling walker (2 wheels) Gait Pattern/deviations: Step-through pattern, Decreased stride length Gait velocity: decreased         Stairs             Wheelchair Mobility     Tilt Bed    Modified Rankin (Stroke Patients Only)       Balance Overall balance assessment: Needs assistance Sitting-balance support: No upper extremity supported, Feet supported Sitting balance-Leahy Scale: Fair Sitting balance - Comments: seated EOB  Standing balance support: Bilateral upper extremity supported, During functional activity, Reliant on assistive device for balance Standing balance-Leahy Scale: Fair Standing balance comment: with RW                            Communication Communication Communication: No  apparent difficulties  Cognition Arousal: Alert Behavior During Therapy: WFL for tasks assessed/performed   PT - Cognitive impairments: No apparent impairments                         Following commands: Intact      Cueing Cueing Techniques: Verbal cues  Exercises General Exercises - Lower Extremity Long Arc Quad: Both, 10 reps, Seated Hip Flexion/Marching: Both, 10 reps, Seated    General Comments        Pertinent Vitals/Pain Pain Assessment Pain Assessment: No/denies pain    Home Living                          Prior Function            PT Goals (current goals can now be found in the care plan section) Acute Rehab PT Goals Patient Stated Goal: pt would like to return home PT Goal Formulation: With patient/family Time For Goal Achievement: 05/28/24 Potential to Achieve Goals: Good Progress towards PT goals: Progressing toward goals    Frequency    Min 3X/week      PT Plan      Co-evaluation              AM-PAC PT 6 Clicks Mobility   Outcome Measure  Help needed turning from your back to your side while in a flat bed without using bedrails?: None Help needed moving from lying on your back to sitting on the side of a flat bed without using bedrails?: None Help needed moving to and from a bed to a chair (including a wheelchair)?: A Little Help needed standing up from a chair using your arms (e.g., wheelchair or bedside chair)?: A Little Help needed to walk in hospital room?: A Little   6 Click Score: 17    End of Session Equipment Utilized During Treatment: Gait belt Activity Tolerance: Patient tolerated treatment well Patient left: in chair;with call bell/phone within reach;with chair alarm set;with family/visitor present   PT Visit Diagnosis: Unsteadiness on feet (R26.81);Muscle weakness (generalized) (M62.81);Difficulty in walking, not elsewhere classified (R26.2)     Time: 8981-8964 PT Time Calculation (min) (ACUTE  ONLY): 17 min  Charges:    $Therapeutic Activity: 8-22 mins PT General Charges $$ ACUTE PT VISIT: 1 Visit                     Lacinda Fass, PT, DPT  05/15/2024, 12:20 PM

## 2024-05-15 NOTE — Plan of Care (Signed)

## 2024-05-15 NOTE — Progress Notes (Signed)
 PROGRESS NOTE  [name] FMW:[id number] DOB: 1945-12-14 DOA: 05/13/2024 PCP: [facility]  Brief History:  78 year old male with history of coronary artery disease status post CABG, diabetes mellitus type 2, hypertension, hyperlipidemia, CKD stage IV, COPD, peripheral arterial disease, atrial tachycardia, diverticulitis status post colostomy and subsequent takedown, TIA presenting with generalized weakness, confusion, decreased oral intake, and difficulty ambulating.  The patient was at the vascular surgery office when he was noted to have the above symptoms.  Family noted the patient has had general decline over the past 2 months.  He has had poor oral intake for a few weeks without any vomiting or diarrhea.  There has been no complaints of chest pain or abdominal pain.  Family has noted increasing confusion over the past few days prior to admission. Notably, the patient had a hospital admission from 03/21/2024 to 03/23/2024 during which time he was treated for community-acquired pneumonia and acute on chronic renal failure. In the ED, the patient was afebrile and hemodynamically stable with oxygen saturation 93-95% room air.  WBC 16.7, hemoglobin 9.1, platelets 544.  Sodium 132, potassium 4.6, bicarbonate 16, serum creatinine 5.48.  LFTs were unremarkable.  Troponin 129>>108.  Ammonia <13.  Lactic acid 2.6>>1.6 TSH 2.250.  CT brain was negative for any acute findings.  Chest x-ray showed chronic interstitial markings.  UA >50 WBC the patient was started on IV ceftriaxone  and IV fluids.   Assessment/Plan: Acute on chronic failure--CKD 4 - Baseline creatinine 1.9-2.2 - Presented with serum creatinine 5.48 - Secondary to volume depletion - Continue IV fluids>>improving   Acute metabolic encephalopathy - Secondary to AKI and UTI - B12--762 - Folic acid --12.6 - TSH--2.250 -ammonia <13 - UA >50WBC - Patient remains confused but improving   UTI - UA>50 WBC - Continued  ceftriaxone  pending culture data - Follow-up blood cultures--neg to date - urine culture neg--continue ceftriaxone  another 24 hours   Failure to thrive - B12--762 - Folic acid --12.6 - TSH--2.250 -ammonia <13 - UA >50WBC - PT evaluation>>SNF   Elevated troponin/CAD -Troponin 129>>108 - Secondary to demand ischemia - No chest pain presently - Personally reviewed EKG--sinus rhythm, no ST ST wave change -status post prior angioplasty and stent placement, status post CABG x 5    Lactic acidosis - Improved with IV fluids -Lactic acid 2.6>> 1.6   Peripheral arterial disease - Status post femorofemoral bypass - 04/17/2024--angioplasty right common iliac and external iliac arteries--Dr. Gretta - Continue aspirin , Plavix , Pletal    Essential hypertension - Continue metoprolol   - restart amlodipine  if BP poorly controlled   Mixed hyperlipidemia - Continue statin             Family Communication:  wife bedside 10/2   Consultants:  none   Code Status:  FULL   DVT Prophylaxis:  [place] Heparin      Procedures: As Listed in Progress Note Above   Antibiotics: Ceftriaxone  9/30>>           Subjective: Patient denies fevers, chills, headache, chest pain, dyspnea, nausea, vomiting, diarrhea, abdominal pain, dysuria, hematuria, hematochezia, and melena.   Objective: Vitals:   05/14/24 2052 05/15/24 0503 05/15/24 0953 05/15/24 1232  BP: (!) 161/73 (!) 143/61 124/68 (!) 162/59  Pulse: 89 91 94 89  Resp: 16 18 20    Temp: 97.6 F (36.4 C) 98.3 F (36.8 C) (!) 97.5 F (36.4 C) 97.8 F (36.6 C)  TempSrc: Oral Oral Oral Oral  SpO2: 92% 96% 96%  96%  Weight:      Height:        Intake/Output Summary (Last 24 hours) at 05/15/2024 1729 Last data filed at 05/15/2024 1500 Gross per 24 hour  Intake 1546.28 ml  Output 300 ml  Net 1246.28 ml   Weight change:  Exam:  General:  Pt is alert, follows commands appropriately, not in acute distress HEENT: No icterus, No thrush,  No neck mass, [place]/AT Cardiovascular: RRR, S1/S2, no rubs, no gallops Respiratory:bibasilar crackles.  No wheeze Abdomen: Soft/+BS, non tender, non distended, no guarding Extremities: No edema, No lymphangitis, No petechiae, No rashes, no synovitis   Data Reviewed: I have personally reviewed following labs and imaging studies Basic Metabolic Panel: Recent Labs  Lab 05/13/24 1240 05/13/24 1250 05/14/24 0411 05/15/24 0414  NA 132*  --  138 137  K 4.6  --  4.4 4.3  CL 96*  --  103 104  CO2 16*  --  17* 18*  GLUCOSE 301*  --  104* 77  BUN 64*  --  59* 49*  CREATININE 5.48*  --  4.26* 3.32*  CALCIUM  9.4  --  9.1 9.5  MG  --  2.2  --  2.3   Liver Function Tests: Recent Labs  Lab 05/13/24 1240  AST 29  ALT 10  ALKPHOS 138*  BILITOT 0.4  PROT 7.8  ALBUMIN  3.7   No results for input(s): LIPASE, AMYLASE in the last 168 hours. Recent Labs  Lab 05/13/24 1319  AMMONIA <13   Coagulation Profile: No results for input(s): INR, PROTIME in the last 168 hours. CBC: Recent Labs  Lab 05/13/24 1240 05/14/24 0411 05/15/24 0414  WBC 16.7* 16.0* 15.5*  NEUTROABS 13.5*  --   --   HGB 9.1* 8.6* 8.4*  HCT 29.2* 28.4* 27.8*  MCV 79.8* 80.9 81.5  PLT 544* 436* 449*   Cardiac Enzymes: Recent Labs  Lab 05/14/24 0840  CKTOTAL 36*   BNP: Invalid input(s): POCBNP CBG: Recent Labs  Lab 05/14/24 1603 05/14/24 1930 05/15/24 0707 05/15/24 1124 05/15/24 1600  GLUCAP 103* 167* 78 158* 205*   HbA1C: Recent Labs    05/13/24 1240  HGBA1C 9.0*   Urine analysis:    Component Value Date/Time   COLORURINE YELLOW 05/13/2024 1525   APPEARANCEUR CLOUDY (A) 05/13/2024 1525   LABSPEC 1.016 05/13/2024 1525   PHURINE 5.0 05/13/2024 1525   GLUCOSEU >=500 (A) 05/13/2024 1525   HGBUR SMALL (A) 05/13/2024 1525   BILIRUBINUR NEGATIVE 05/13/2024 1525   KETONESUR NEGATIVE 05/13/2024 1525   PROTEINUR 30 (A) 05/13/2024 1525   UROBILINOGEN 0.2 03/16/2015 0120   NITRITE NEGATIVE  05/13/2024 1525   LEUKOCYTESUR MODERATE (A) 05/13/2024 1525   Sepsis Labs: @LABRCNTIP (procalcitonin:4,lacticidven:4) ) Recent Results (from the past 240 hours)  Culture, blood (routine x 2)     Status: None (Preliminary result)   Collection Time: 05/13/24  2:43 PM   Specimen: BLOOD  Result Value Ref Range Status   Specimen Description BLOOD BLOOD RIGHT HAND  Final   Special Requests   Final    BOTTLES DRAWN AEROBIC AND ANAEROBIC Blood Culture adequate volume   Culture   Final    NO GROWTH 2 DAYS Performed at [facility], [address] [place], [place] [address]    Report Status PENDING  Incomplete  Culture, blood (routine x 2)     Status: None (Preliminary result)   Collection Time: 05/13/24  2:43 PM   Specimen: BLOOD  Result Value Ref Range Status  Specimen Description BLOOD BLOOD RIGHT ARM  Final   Special Requests   Final    BOTTLES DRAWN AEROBIC AND ANAEROBIC Blood Culture adequate volume   Culture   Final    NO GROWTH 2 DAYS Performed at [facility], [address], [place], [place] [address]    Report Status PENDING  Incomplete  Urine Culture     Status: None   Collection Time: 05/13/24  3:25 PM   Specimen: Urine, Clean Catch  Result Value Ref Range Status   Specimen Description   Final    URINE, CLEAN CATCH Performed at [facility], [address], [place], [place] [address]    Special Requests   Final    NONE Performed at [facility], [address] [place], [place] [address]    Culture   Final    NO GROWTH Performed at [facility], [address], [place], [place] [address]    Report Status 05/14/2024 FINAL  Final  Resp panel by RT-PCR (RSV, Flu A&B, Covid) Anterior Nasal Swab     Status: None   Collection Time: 05/14/24  9:01 AM   Specimen: Anterior Nasal Swab  Result Value Ref Range Status   SARS Coronavirus 2 by RT PCR NEGATIVE NEGATIVE Final    Comment: (NOTE) SARS-CoV-2 target nucleic acids are NOT DETECTED.  The SARS-CoV-2 RNA is  generally detectable in upper respiratory specimens during the acute phase of infection. The lowest concentration of SARS-CoV-2 viral copies this assay can detect is 138 copies/mL. A negative result does not preclude SARS-Cov-2 infection and should not be used as the sole basis for treatment or other patient management decisions. A negative result may occur with  improper specimen collection/handling, submission of specimen other than nasopharyngeal swab, presence of viral mutation(s) within the areas targeted by this assay, and inadequate number of viral copies(<138 copies/mL). A negative result must be combined with clinical observations, patient history, and epidemiological information. The expected result is Negative.  Fact Sheet for Patients:  BloggerCourse.com  Fact Sheet for Healthcare Providers:  SeriousBroker.it  This test is no t yet approved or cleared by the [place]  FDA and  has been authorized for detection and/or diagnosis of SARS-CoV-2 by FDA under an Emergency Use Authorization (EUA). This EUA will remain  in effect (meaning this test can be used) for the duration of the COVID-19 declaration under Section 564(b)(1) of the Act, 21 U.S.C.section 360bbb-3(b)(1), unless the authorization is terminated  or revoked sooner.       Influenza A by PCR NEGATIVE NEGATIVE Final   Influenza B by PCR NEGATIVE NEGATIVE Final    Comment: (NOTE) The Xpert Xpress SARS-CoV-2/FLU/RSV plus assay is intended as an aid in the diagnosis of influenza from Nasopharyngeal swab specimens and should not be used as a sole basis for treatment. Nasal washings and aspirates are unacceptable for Xpert Xpress SARS-CoV-2/FLU/RSV testing.  Fact Sheet for Patients: BloggerCourse.com  Fact Sheet for Healthcare Providers: SeriousBroker.it  This test is not yet approved or cleared by the [place] FDA and has been authorized for detection and/or diagnosis of SARS-CoV-2 by FDA under an Emergency Use Authorization (EUA). This EUA will remain in effect (meaning this test can be used) for the duration of the COVID-19 declaration under Section 564(b)(1) of the Act, 21 U.S.C. section 360bbb-3(b)(1), unless the authorization is terminated or revoked.     Resp Syncytial Virus by PCR NEGATIVE NEGATIVE Final    Comment: (NOTE) Fact Sheet for Patients: BloggerCourse.com  Fact Sheet for Healthcare Providers: SeriousBroker.it  This test is not yet approved or cleared by the [place]  FDA and has been authorized for detection and/or diagnosis of SARS-CoV-2 by FDA under an Emergency Use Authorization (EUA). This EUA will remain in effect (meaning this test can be used) for the duration of the COVID-19 declaration under Section 564(b)(1) of the Act, 21 U.S.C. section 360bbb-3(b)(1), unless the authorization is terminated or revoked.  Performed at [facility], [address] [place], [place] [address]      Scheduled Meds:  aspirin  EC  81 mg Oral Q breakfast   atorvastatin   40 mg Oral QHS   cilostazol   100 mg Oral BID   clopidogrel   75 mg Oral q AM   feeding supplement  237 mL Oral BID BM   heparin   5,000 Units Subcutaneous Q8H   insulin  aspart  0-5 Units Subcutaneous QHS   insulin  aspart  0-9 Units Subcutaneous TID WC   insulin  glargine  15 Units Subcutaneous QHS   metoprolol  succinate  25 mg Oral Daily   multivitamin with minerals  1 tablet Oral Daily   polyethylene glycol  17 g Oral BID   Continuous Infusions:  cefTRIAXone  (ROCEPHIN )  IV 1 g (05/15/24 1341)   lactated ringers       Procedures/Studies: CT Head Wo Contrast Result Date: 05/13/2024 CLINICAL DATA:  Worsening health over the past month with decreased appetite, nighttime confusion, unsteady gait and tremors. EXAM: CT HEAD WITHOUT CONTRAST TECHNIQUE:  Contiguous axial images were obtained from the base of the skull through the vertex without intravenous contrast. RADIATION DOSE REDUCTION: This exam was performed according to the departmental dose-optimization program which includes automated exposure control, adjustment of the mA and/or kV according to patient size and/or use of iterative reconstruction technique. COMPARISON:  03/29/2024 FINDINGS: Brain: Ventricles, cisterns and other CSF spaces are normal. Cavum septum variant is present. Very minimal chronic ischemic microvascular disease. 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 No mass, mass effect, shift of midline structures or acute hemorrhage. No evidence of acute infarction. Vascular: No hyperdense vessel or unexpected calcification. Skull: Normal. Negative for fracture or focal lesion. Sinuses/Orbits: No acute finding. Other: None. IMPRESSION: 1. No acute findings. 2. Very minimal chronic ischemic microvascular disease. 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 Electronically Signed   By: Toribio Agreste M.D.   On: 05/13/2024 15:48   DG Chest Port 1 View Result Date: 05/13/2024 CLINICAL DATA:  Weakness EXAM: PORTABLE CHEST 1 VIEW COMPARISON:  Chest x-ray performed April 14, 2024 FINDINGS: Postsurgical changes from sternotomy. Low lung volumes. Similar appearance of interstitial airspace opacities. Elevated left hemidiaphragm. IMPRESSION: 1. Low lung volumes. 2. Grossly similar accounting for variation in technique. Electronically Signed   By: Maude Naegeli M.D.   On: 05/13/2024 13:21   VAS US  ABI WITH/WO TBI Result Date: 05/13/2024  LOWER EXTREMITY DOPPLER STUDY Patient Name:  ARAS ALBARRAN  Date of Exam:   05/13/2024 Medical Rec #: [id number]     Accession #:    [id number] Date of Birth: 02-18-1946     Patient Gender: M Patient Age:   73 years Exam Location:  [address] Procedure:      VAS US  ABI WITH/WO TBI Referring Phys: LONNI GASKINS  --------------------------------------------------------------------------------  Indications: Peripheral artery disease.  Vascular Interventions: 04/17/24: DCBA right CIA and EIA stents.  09/22/21: Right CIA and EIA angioplasty with CIA stent.                         08/09/16: Right iliofemoral endarterectomy.                         Bovine pericardial patch angioplasty, right common                         femoral artery.                         Redo right limb of right to left femoral-femoral bypass                         graft. Performing Technologist: King Pierre RVT  Examination Guidelines: A complete evaluation includes at minimum, Doppler waveform signals and systolic blood pressure reading at the level of bilateral brachial, anterior tibial, and posterior tibial arteries, when vessel segments are accessible. Bilateral testing is considered an integral part of a complete examination. Photoelectric Plethysmograph (PPG) waveforms and toe systolic pressure readings are included as required and additional duplex testing as needed. Limited examinations for reoccurring indications may be performed as noted.  ABI Findings: +---------+------------------+-----+----------+--------+ Right    Rt Pressure (mmHg)IndexWaveform  Comment  +---------+------------------+-----+----------+--------+ Brachial 157                                       +---------+------------------+-----+----------+--------+ PTA      254               1.62 monophasic         +---------+------------------+-----+----------+--------+ DP       85                0.54 monophasic         +---------+------------------+-----+----------+--------+ Great Toe0                 0.00                    +---------+------------------+-----+----------+--------+ +---------+------------------+-----+----------+-------+ Left     Lt Pressure (mmHg)IndexWaveform  Comment  +---------+------------------+-----+----------+-------+ Brachial 153                                      +---------+------------------+-----+----------+-------+ PTA      254               1.62 monophasic        +---------+------------------+-----+----------+-------+ DP       254               1.62 monophasic        +---------+------------------+-----+----------+-------+ Great Toe0                 0.00                   +---------+------------------+-----+----------+-------+ +-------+-----------+-----------+------------+------------+ ABI/TBIToday's ABIToday's TBIPrevious ABIPrevious TBI +-------+-----------+-----------+------------+------------+ Right  [place]                    0.7         0.28         +-------+-----------+-----------+------------+------------+ Left   [place]          0.18         +-------+-----------+-----------+------------+------------+  Previous ABI on 01/22/24 0.7/0.28 /0.18.  Summary: Right: Resting right ankle-brachial index indicates noncompressible right lower extremity arteries. The right toe-brachial index is abnormal.  Left: Resting left ankle-brachial index indicates noncompressible left lower extremity arteries. The left toe-brachial index is abnormal.  *See table(s) above for measurements and observations.  Electronically signed by Lonni Gaskins MD on 05/13/2024 at 12:34:42 PM.    Final    VAS US  AORTA/IVC/ILIACS Result Date: 05/13/2024 ABDOMINAL AORTA STUDY Patient Name:  DYLLON HENKEN  Date of Exam:   05/13/2024 Medical Rec #: [id number]     Accession #:    [id number] Date of Birth: 09-Apr-1946     Patient Gender: M Patient Age:   25 years Exam Location:  [address] Procedure:      VAS US  AORTA/IVC/ILIACS Referring Phys: LONNI GASKINS --------------------------------------------------------------------------------  Vascular Interventions: 04/17/24: DCBA right CIA and EIA stents.                         09/22/21: Right CIA  and EIA angioplasty with CIA stent.                         08/09/16: Right iliofemoral endarterectomy.                         Bovine pericardial patch angioplasty, right common                         femoral artery.                         Redo right limb of right to left femoral-femoral bypass                         graft.  Comparison Study: 01/22/24: Patent right CIA and EIA stents with elevated                   velocities in the EIA in the > 50% range. Patent right to left                   fem- fem BPG with 50-74% stenosis at both anastomoses. Performing Technologist: King Pierre RVT  Examination Guidelines: A complete evaluation includes B-mode imaging, spectral Doppler, color Doppler, and power Doppler as needed of all accessible portions of each vessel. Bilateral testing is considered an integral part of a complete examination. Limited examinations for reoccurring indications may be performed as noted.  Abdominal Aorta Findings: +-------------+-------+----------+----------+----------+--------+---------+ Location     AP (cm)Trans (cm)PSV (cm/s)Waveform  ThrombusComments  +-------------+-------+----------+----------+----------+--------+---------+ RT CIA Prox                   217       biphasic                    +-------------+-------+----------+----------+----------+--------+---------+ RT CIA Mid                    299       monophasic                  +-------------+-------+----------+----------+----------+--------+---------+ RT CIA Distal  154       monophasic        turbulent +-------------+-------+----------+----------+----------+--------+---------+ RT EIA Prox                   467       monophasic                  +-------------+-------+----------+----------+----------+--------+---------+ RT EIA Mid                    388       monophasic                  +-------------+-------+----------+----------+----------+--------+---------+ RT  EIA Distal                 383       monophasic                  +-------------+-------+----------+----------+----------+--------+---------+ Right Graft #1: +---------------------+--------+--------+----------+--------+ Right to left fem-femPSV cm/sStenosisWaveform  Comments +---------------------+--------+--------+----------+--------+ Inflow               383             monophasic         +---------------------+--------+--------+----------+--------+ Prox Anastomosis     272             monophasic         +---------------------+--------+--------+----------+--------+ Proximal Graft       104             monophasic         +---------------------+--------+--------+----------+--------+ Mid Graft            60              monophasic         +---------------------+--------+--------+----------+--------+ Distal Graft         62              biphasic           +---------------------+--------+--------+----------+--------+ Distal Anastomosis   222             biphasic           +---------------------+--------+--------+----------+--------+ Outflow              367             monophasic         +---------------------+--------+--------+----------+--------+  Summary: Stenosis: Elevated velocities in the CIA with significant elevation in the EIA. Patent right to left fem-fem with inflow stenosis (EIA)and outflow stenosis (SFA).  *See table(s) above for measurements and observations.  Electronically signed by Lonni Gaskins MD on 05/13/2024 at 12:01:43 PM.    Final    PERIPHERAL VASCULAR CATHETERIZATION Result Date: 04/17/2024 Images from the original result were not included.   Patient name: BRECKAN CAFIERO    MRN: [id number]        DOB: 04-03-1946          Sex: male  04/17/2024 Pre-operative Diagnosis: Threatened right to left femoral-femoral bypass with high-grade stenosis in right iliac stents Post-operative diagnosis:  Same Surgeon:  Lonni DOROTHA Gaskins, MD Procedure  Performed: 1.  Ultrasound-guided access right to left femorofemoral bypass retrograde 2.  Aortogram with catheter selection of aorta 3.  Drug-coated balloon angioplasty right common and external iliac artery stents (7 mm x 150 mm drug-coated Ranger) 4.  34 minutes of monitored moderate conscious sedation time  Indications: 78 year old male who previously underwent a right to left femoral-femoral bypass by Dr. Dyane years  ago.  He had multiple stenting procedures of his right common and external iliac artery.  Recent duplex showed increasing stenosis in the right iliac stents with velocities near 500 suggesting high-grade stenosis.  He presents for lower extremity angiogram with a focus on the femorofemoral bypass and right iliac stents which is the inflow after risks benefits discussed.  Findings:  Ultrasound-guided access right to left femorofemoral bypass retrograde.  Tried to use CO2 but could not get any good imaging.  Angiogram ultimately showed diffuse disease throughout the right common and external iliac stents with what look like at least 50% stenosis in the proximal external iliac artery near the hypogastric.  Ultimately elected to treat all this with 7 mm x 150 mm drug-coated Ranger for prolonged inflation of 3 minutes.  Widely patent stents at completion.  Patent femorofemoral bypass.  Profunda runoff bilaterally with occluded SFAs.             Procedure:  The patient was identified in the holding area and taken to room 8.  The patient was then placed supine on the table and prepped and draped in the usual sterile fashion.  A time out was called.  Patient received Versed  and fentanyl  for conscious moderate sedation.  Vital signs are monitored including heart rate, respiratory rate, oxygenation blood pressure.  I was present for all of moderate sedation.  Ultrasound was used to evaluate the femoral femoral bypasss, it was patent, an image was saved.  This was accessed with a micro access needle placed a  microwire and micro sheath.  I then used a Glidewire advantage and upsized to a short 5 French sheath in the femoral femoral bypass.  The patient was given 100 units/kg IV heparin .  I then used a KMP catheter to drive the Glidewire advantage retrograde through the right iliac stents and exchanged for an Omni Flush catheter in the abdominal aorta.  Initially tried to use CO2 due to chronic kidney disease.  Due to bowel gas there was no visualization of the iliac stents.  Ultimately elected for contrast injection.  Ultimately as noted above we elected for drug-coated balloon angioplasty and exchanged for a V18 wire up into the aorta through the right iliac stents.  I then used a 7 mm x 150 mm drug-coated Ranger inflated to nominal pressure for 3 minutes.  Final imaging showed widely patent stents.  I did get an additional image of the left groin to look at the outflow of the femorofemoral bypass.  He has profunda runoff bilaterally.  Wires and catheters were removed.  Will be taken to holding for sheath removal.  Plan: Excellent results after right iliac artery drug-coated balloon angioplasty.  Plan aspirin  statin Plavix .  Arrange follow-up in 1 month.     Lonni DOROTHA Gaskins, MD Vascular [facility] Office: [phone]    Alm Schneider, DO  [facility]  If 7PM-7AM, please contact night-coverage [email or web address] Password [email or web address] 05/15/2024, 5:29 PM   LOS: 2 days

## 2024-05-15 NOTE — Plan of Care (Signed)
  Problem: Clinical Measurements: Goal: Ability to maintain clinical measurements within normal limits will improve Outcome: Progressing Goal: Will remain free from infection Outcome: Progressing Goal: Diagnostic test results will improve Outcome: Progressing Goal: Respiratory complications will improve Outcome: Progressing Goal: Cardiovascular complication will be avoided Outcome: Progressing   Problem: Activity: Goal: Risk for activity intolerance will decrease Outcome: Progressing   Problem: Nutrition: Goal: Adequate nutrition will be maintained Outcome: Progressing   Problem: Elimination: Goal: Will not experience complications related to bowel motility Outcome: Progressing Goal: Will not experience complications related to urinary retention Outcome: Progressing   Problem: Safety: Goal: Ability to remain free from injury will improve Outcome: Progressing   Problem: Skin Integrity: Goal: Risk for impaired skin integrity will decrease Outcome: Progressing   Problem: Education: Goal: Ability to describe self-care measures that may prevent or decrease complications (Diabetes Survival Skills Education) will improve Outcome: Progressing Goal: Individualized Educational Video(s) Outcome: Progressing   Problem: Coping: Goal: Ability to adjust to condition or change in health will improve Outcome: Progressing   Problem: Fluid Volume: Goal: Ability to maintain a balanced intake and output will improve Outcome: Progressing   Problem: Health Behavior/Discharge Planning: Goal: Ability to manage health-related needs will improve Outcome: Progressing   Problem: Metabolic: Goal: Ability to maintain appropriate glucose levels will improve Outcome: Progressing   Problem: Nutritional: Goal: Maintenance of adequate nutrition will improve Outcome: Progressing Goal: Progress toward achieving an optimal weight will improve Outcome: Progressing   Problem: Skin  Integrity: Goal: Risk for impaired skin integrity will decrease Outcome: Progressing   Problem: Tissue Perfusion: Goal: Adequacy of tissue perfusion will improve Outcome: Progressing

## 2024-05-15 NOTE — TOC Progression Note (Signed)
 Transition of Care [facility]) - Progression Note    Patient Details  Name: [name] MRN: [id number] Date of Birth: July 29, 1946  Transition of [facility]) CM/SW Contact  Lucie Lunger, [place] Phone Number: 05/15/2024, 11:23 AM  Clinical Narrative:    CSW spoke with pt and spouse at bedside. CSW reviewed bed offers for SNF placement. Pt and spouse are still deciding between SNF vs HH. They are agreeable to holding bed at [facility] and getting SNF auth started. CSW to follow up with pt and spouse tomorrow to see if they are still agreeable to SNF or if they prefer HH. TOC to follow.   Expected Discharge Plan: Skilled Nursing Facility Barriers to Discharge: Continued Medical Work up               Expected Discharge Plan and Services In-house Referral: Clinical Social Work Discharge Planning Services: CM Consult Post Acute Care Choice: Skilled Nursing Facility Living arrangements for the past 2 months: Single Family Home                                       Social Drivers of Health (SDOH) Interventions SDOH Screenings   Food Insecurity: No Food Insecurity (05/14/2024)  Housing: Low Risk  (05/13/2024)  Transportation Needs: No Transportation Needs (05/13/2024)  Utilities: Not At Risk (05/13/2024)  Depression (PHQ2-9): Low Risk  (04/10/2024)  Financial Resource Strain: Low Risk  (10/08/2023)   Received from [facility]  Social Connections: Socially Integrated (05/14/2024)  Tobacco Use: Medium Risk (05/13/2024)  Health Literacy: High Risk (10/08/2023)   Received from [facility]    Readmission Risk Interventions    05/14/2024   10:56 AM 03/22/2024    4:21 PM 01/29/2024   11:17 AM  Readmission Risk Prevention Plan  Transportation Screening Complete Complete Complete  PCP or Specialist Appt within 3-5 Days   Complete  HRI or Home Care Consult   Complete  Palliative Care Screening   Not Applicable  Medication Review (RN Care Manager) Complete Complete Complete  PCP or  Specialist appointment within 3-5 days of discharge  Complete   HRI or Home Care Consult Complete Complete   SW Recovery Care/Counseling Consult Complete Complete   Palliative Care Screening Not Applicable Not Applicable   Skilled Nursing Facility Complete Not Applicable

## 2024-05-16 ENCOUNTER — Other Ambulatory Visit (HOSPITAL_COMMUNITY): Payer: Self-pay

## 2024-05-16 ENCOUNTER — Telehealth (HOSPITAL_COMMUNITY): Payer: Self-pay | Admitting: Pharmacy Technician

## 2024-05-16 ENCOUNTER — Encounter: Payer: Self-pay | Admitting: Oncology

## 2024-05-16 ENCOUNTER — Inpatient Hospital Stay (HOSPITAL_COMMUNITY)

## 2024-05-16 ENCOUNTER — Other Ambulatory Visit: Payer: Self-pay | Admitting: Vascular Surgery

## 2024-05-16 DIAGNOSIS — I739 Peripheral vascular disease, unspecified: Secondary | ICD-10-CM

## 2024-05-16 DIAGNOSIS — N178 Other acute kidney failure: Secondary | ICD-10-CM | POA: Diagnosis not present

## 2024-05-16 DIAGNOSIS — N3090 Cystitis, unspecified without hematuria: Secondary | ICD-10-CM | POA: Diagnosis not present

## 2024-05-16 DIAGNOSIS — I771 Stricture of artery: Secondary | ICD-10-CM

## 2024-05-16 DIAGNOSIS — N184 Chronic kidney disease, stage 4 (severe): Secondary | ICD-10-CM | POA: Diagnosis not present

## 2024-05-16 DIAGNOSIS — G9340 Encephalopathy, unspecified: Secondary | ICD-10-CM | POA: Diagnosis not present

## 2024-05-16 LAB — CBC
HCT: 28.4 % — ABNORMAL LOW (ref 39.0–52.0)
Hemoglobin: 8.5 g/dL — ABNORMAL LOW (ref 13.0–17.0)
MCH: 24.4 pg — ABNORMAL LOW (ref 26.0–34.0)
MCHC: 29.9 g/dL — ABNORMAL LOW (ref 30.0–36.0)
MCV: 81.6 fL (ref 80.0–100.0)
Platelets: 441 K/uL — ABNORMAL HIGH (ref 150–400)
RBC: 3.48 MIL/uL — ABNORMAL LOW (ref 4.22–5.81)
RDW: 19.6 % — ABNORMAL HIGH (ref 11.5–15.5)
WBC: 16.7 K/uL — ABNORMAL HIGH (ref 4.0–10.5)
nRBC: 0 % (ref 0.0–0.2)

## 2024-05-16 LAB — RESPIRATORY PANEL BY PCR

## 2024-05-16 LAB — MAGNESIUM: Magnesium: 2.2 mg/dL (ref 1.7–2.4)

## 2024-05-16 LAB — GLUCOSE, CAPILLARY
Glucose-Capillary: 71 mg/dL (ref 70–99)
Glucose-Capillary: 114 mg/dL — ABNORMAL HIGH (ref 70–99)
Glucose-Capillary: 77 mg/dL (ref 70–99)
Glucose-Capillary: 89 mg/dL (ref 70–99)
Glucose-Capillary: 94 mg/dL (ref 70–99)

## 2024-05-16 LAB — MRSA NEXT GEN BY PCR, NASAL: MRSA by PCR Next Gen: NOT DETECTED

## 2024-05-16 LAB — BASIC METABOLIC PANEL WITH GFR
Anion gap: 16 — ABNORMAL HIGH (ref 5–15)
BUN: 41 mg/dL — ABNORMAL HIGH (ref 8–23)
CO2: 18 mmol/L — ABNORMAL LOW (ref 22–32)
Calcium: 9.6 mg/dL (ref 8.9–10.3)
Chloride: 106 mmol/L (ref 98–111)
Creatinine, Ser: 2.79 mg/dL — ABNORMAL HIGH (ref 0.61–1.24)
GFR, Estimated: 23 mL/min — ABNORMAL LOW (ref >60)
Glucose, Bld: 72 mg/dL (ref 70–99)
Potassium: 4 mmol/L (ref 3.5–5.1)
Sodium: 139 mmol/L (ref 135–145)

## 2024-05-16 MED ORDER — SODIUM CHLORIDE 0.9 % IV SOLN
3.0000 g | Freq: Two times a day (BID) | INTRAVENOUS | Status: AC
  Administered 2024-05-16 – 2024-05-20 (×10): 3 g via INTRAVENOUS
  Filled 2024-05-16 (×11): qty 8

## 2024-05-16 MED ORDER — LINEZOLID 600 MG/300ML IV SOLN
600.0000 mg | Freq: Two times a day (BID) | INTRAVENOUS | Status: DC
  Administered 2024-05-16 – 2024-05-19 (×7): 600 mg via INTRAVENOUS
  Filled 2024-05-16 (×10): qty 300

## 2024-05-16 MED ORDER — VANCOMYCIN HCL 1250 MG/250ML IV SOLN
1250.0000 mg | Freq: Once | INTRAVENOUS | Status: DC
  Administered 2024-05-16: 1250 mg via INTRAVENOUS
  Filled 2024-05-16: qty 250

## 2024-05-16 MED ORDER — VANCOMYCIN HCL 750 MG/150ML IV SOLN: 750.0000 mg | INTRAVENOUS | Status: DC

## 2024-05-16 NOTE — Progress Notes (Signed)
 PROGRESS NOTE  [name] FMW:[id number] DOB: 1946/06/13 DOA: 05/13/2024 PCP: [facility]  Brief History:  78 year old male with history of coronary artery disease status post CABG, diabetes mellitus type 2, hypertension, hyperlipidemia, CKD stage IV, COPD, peripheral arterial disease, atrial tachycardia, diverticulitis status post colostomy and subsequent takedown, TIA presenting with generalized weakness, confusion, decreased oral intake, and difficulty ambulating.  The patient was at the vascular surgery office when he was noted to have the above symptoms.  Family noted the patient has had general decline over the past 2 months.  He has had poor oral intake for a few weeks without any vomiting or diarrhea.  There has been no complaints of chest pain or abdominal pain.  Family has noted increasing confusion over the past few days prior to admission. Notably, the patient had a hospital admission from 03/21/2024 to 03/23/2024 during which time he was treated for community-acquired pneumonia and acute on chronic renal failure. In the ED, the patient was afebrile and hemodynamically stable with oxygen saturation 93-95% room air.  WBC 16.7, hemoglobin 9.1, platelets 544.  Sodium 132, potassium 4.6, bicarbonate 16, serum creatinine 5.48.  LFTs were unremarkable.  Troponin 129>>108.  Ammonia <13.  Lactic acid 2.6>>1.6 TSH 2.250.  CT brain was negative for any acute findings.  Chest x-ray showed chronic interstitial markings.  UA >50 WBC the patient was started on IV ceftriaxone  and IV fluids.   Assessment/Plan:  Acute on chronic failure--CKD 4 - Baseline creatinine 1.9-2.2 - Presented with serum creatinine 5.48 - Secondary to volume depletion - Continue IV fluids>>improving   Acute metabolic encephalopathy - Secondary to AKI and UTI - B12--762 - Folic acid --12.6 - TSH--2.250 -ammonia <13 - UA >50WBC - Patient remains confused - MR brain -no focal neuro deficits  Right  facial edema -CT maxillofacial -10/2 xray-- No evidence for prevertebral soft tissue swelling anterior to the C1 orC2 vertebral bodies although exam is otherwise nondiagnostic -suspect parotitis -empiric zyvox/unasyn for now -COVID negative   UTI - UA>50 WBC - Continued ceftriaxone  pending culture data - Follow-up blood cultures--neg to date - urine culture neg--received 3 days ceftriaxone    Failure to thrive - B12--762 - Folic acid --12.6 - TSH--2.250 -ammonia <13 - UA >50WBC - PT evaluation>>SNF   Elevated troponin/CAD -Troponin 129>>108 - Secondary to demand ischemia - No chest pain presently - Personally reviewed EKG--sinus rhythm, no ST ST wave change -status post prior angioplasty and stent placement, status post CABG x 5    Lactic acidosis - Improved with IV fluids -Lactic acid 2.6>> 1.6   Peripheral arterial disease - Status post femorofemoral bypass - 04/17/2024--angioplasty right common iliac and external iliac arteries--Dr. Gretta - Continue aspirin , Plavix , Pletal    Essential hypertension - Continue metoprolol   - restart amlodipine  if BP poorly controlled   Mixed hyperlipidemia - Continue statin             Family Communication:  wife bedside 10/2   Consultants:  none   Code Status:  FULL   DVT Prophylaxis:  [place] Heparin      Procedures: As Listed in Progress Note Above   Antibiotics: Ceftriaxone  9/30>>10/2 Unasyn 10/3>> Zyvox 10/3>>     Subjective: Pt is pleasantly confused.  Denies cp, sob, n/v/ abd pain.  Denies face pain  Objective: Vitals:   05/15/24 0953 05/15/24 1232 05/15/24 1956 05/16/24 0446  BP: 124/68 (!) 162/59 (!) 154/75 (!) 160/72  Pulse: 94 89 93 94  Resp:  20  20 20   Temp: (!) 97.5 F (36.4 C) 97.8 F (36.6 C) 98.5 F (36.9 C) 97.7 F (36.5 C)  TempSrc: Oral Oral Oral Oral  SpO2: 96% 96% 98% 99%  Weight:      Height:        Intake/Output Summary (Last 24 hours) at 05/16/2024 0753 Last data filed at  05/16/2024 0605 Gross per 24 hour  Intake 1355.13 ml  Output 500 ml  Net 855.13 ml   Weight change:  Exam:  General:  Pt is alert, follows commands appropriately, not in acute distress HEENT: mild edema right posterior mandible area, mild erythema, no induration, no crepitance, no open wound.  No tenderness to palpation Cardiovascular: RRR, S1/S2, no rubs, no gallops Respiratory: CTA bilaterally, no wheezing, no crackles, no rhonchi Abdomen: Soft/+BS, non tender, non distended, no guarding Extremities: No edema, No lymphangitis, No petechiae, No rashes, no synovitis Neuro:  CN II-XII intact, strength 4/5 in RUE, RLE, strength 4/5 LUE, LLE; sensation intact bilateral; no dysmetria; babinski equivocal    Data Reviewed: I have personally reviewed following labs and imaging studies Basic Metabolic Panel: Recent Labs  Lab 05/13/24 1240 05/13/24 1250 05/14/24 0411 05/15/24 0414 05/16/24 0414  NA 132*  --  138 137 139  K 4.6  --  4.4 4.3 4.0  CL 96*  --  103 104 106  CO2 16*  --  17* 18* 18*  GLUCOSE 301*  --  104* 77 72  BUN 64*  --  59* 49* 41*  CREATININE 5.48*  --  4.26* 3.32* 2.79*  CALCIUM  9.4  --  9.1 9.5 9.6  MG  --  2.2  --  2.3 2.2   Liver Function Tests: Recent Labs  Lab 05/13/24 1240  AST 29  ALT 10  ALKPHOS 138*  BILITOT 0.4  PROT 7.8  ALBUMIN  3.7   No results for input(s): LIPASE, AMYLASE in the last 168 hours. Recent Labs  Lab 05/13/24 1319  AMMONIA <13   Coagulation Profile: No results for input(s): INR, PROTIME in the last 168 hours. CBC: Recent Labs  Lab 05/13/24 1240 05/14/24 0411 05/15/24 0414 05/16/24 0414  WBC 16.7* 16.0* 15.5* 16.7*  NEUTROABS 13.5*  --   --   --   HGB 9.1* 8.6* 8.4* 8.5*  HCT 29.2* 28.4* 27.8* 28.4*  MCV 79.8* 80.9 81.5 81.6  PLT 544* 436* 449* 441*   Cardiac Enzymes: Recent Labs  Lab 05/14/24 0840  CKTOTAL 36*   BNP: Invalid input(s): POCBNP CBG: Recent Labs  Lab 05/15/24 0707 05/15/24 1124  05/15/24 1600 05/15/24 1946 05/16/24 0744  GLUCAP 78 158* 205* 178* 71   HbA1C: Recent Labs    05/13/24 1240  HGBA1C 9.0*   Urine analysis:    Component Value Date/Time   COLORURINE YELLOW 05/13/2024 1525   APPEARANCEUR CLOUDY (A) 05/13/2024 1525   LABSPEC 1.016 05/13/2024 1525   PHURINE 5.0 05/13/2024 1525   GLUCOSEU >=500 (A) 05/13/2024 1525   HGBUR SMALL (A) 05/13/2024 1525   BILIRUBINUR NEGATIVE 05/13/2024 1525   KETONESUR NEGATIVE 05/13/2024 1525   PROTEINUR 30 (A) 05/13/2024 1525   UROBILINOGEN 0.2 03/16/2015 0120   NITRITE NEGATIVE 05/13/2024 1525   LEUKOCYTESUR MODERATE (A) 05/13/2024 1525   Sepsis Labs: @LABRCNTIP (procalcitonin:4,lacticidven:4) ) Recent Results (from the past 240 hours)  Culture, blood (routine x 2)     Status: None (Preliminary result)   Collection Time: 05/13/24  2:43 PM   Specimen: BLOOD  Result Value Ref Range Status  Specimen Description BLOOD BLOOD RIGHT HAND  Final   Special Requests   Final    BOTTLES DRAWN AEROBIC AND ANAEROBIC Blood Culture adequate volume   Culture   Final    NO GROWTH 2 DAYS Performed at [facility], [address], [place], [place] [address]    Report Status PENDING  Incomplete  Culture, blood (routine x 2)     Status: None (Preliminary result)   Collection Time: 05/13/24  2:43 PM   Specimen: BLOOD  Result Value Ref Range Status   Specimen Description BLOOD BLOOD RIGHT ARM  Final   Special Requests   Final    BOTTLES DRAWN AEROBIC AND ANAEROBIC Blood Culture adequate volume   Culture   Final    NO GROWTH 2 DAYS Performed at [facility], [address] [place], [place] [address]    Report Status PENDING  Incomplete  Urine Culture     Status: None   Collection Time: 05/13/24  3:25 PM   Specimen: Urine, Clean Catch  Result Value Ref Range Status   Specimen Description   Final    URINE, CLEAN CATCH Performed at [facility], [address] [place], [place] [address]    Special Requests   Final     NONE Performed at [facility], [address] [place], [place] [address]    Culture   Final    NO GROWTH Performed at [facility], [address], [place], [place] [address]    Report Status 05/14/2024 FINAL  Final  Resp panel by RT-PCR (RSV, Flu A&B, Covid) Anterior Nasal Swab     Status: None   Collection Time: 05/14/24  9:01 AM   Specimen: Anterior Nasal Swab  Result Value Ref Range Status   SARS Coronavirus 2 by RT PCR NEGATIVE NEGATIVE Final    Comment: (NOTE) SARS-CoV-2 target nucleic acids are NOT DETECTED.  The SARS-CoV-2 RNA is generally detectable in upper respiratory specimens during the acute phase of infection. The lowest concentration of SARS-CoV-2 viral copies this assay can detect is 138 copies/mL. A negative result does not preclude SARS-Cov-2 infection and should not be used as the sole basis for treatment or other patient management decisions. A negative result may occur with  improper specimen collection/handling, submission of specimen other than nasopharyngeal swab, presence of viral mutation(s) within the areas targeted by this assay, and inadequate number of viral copies(<138 copies/mL). A negative result must be combined with clinical observations, patient history, and epidemiological information. The expected result is Negative.  Fact Sheet for Patients:  BloggerCourse.com  Fact Sheet for Healthcare Providers:  SeriousBroker.it  This test is no t yet approved or cleared by the [place]  FDA and  has been authorized for detection and/or diagnosis of SARS-CoV-2 by FDA under an Emergency Use Authorization (EUA). This EUA will remain  in effect (meaning this test can be used) for the duration of the COVID-19 declaration under Section 564(b)(1) of the Act, 21 U.S.C.section 360bbb-3(b)(1), unless the authorization is terminated  or revoked sooner.       Influenza A by PCR NEGATIVE NEGATIVE  Final   Influenza B by PCR NEGATIVE NEGATIVE Final    Comment: (NOTE) The Xpert Xpress SARS-CoV-2/FLU/RSV plus assay is intended as an aid in the diagnosis of influenza from Nasopharyngeal swab specimens and should not be used as a sole basis for treatment. Nasal washings and aspirates are unacceptable for Xpert Xpress SARS-CoV-2/FLU/RSV testing.  Fact Sheet for Patients: BloggerCourse.com  Fact Sheet for  Healthcare Providers: SeriousBroker.it  This test is not yet approved or cleared by the [place]  FDA and has been authorized for detection and/or diagnosis of SARS-CoV-2 by FDA under an Emergency Use Authorization (EUA). This EUA will remain in effect (meaning this test can be used) for the duration of the COVID-19 declaration under Section 564(b)(1) of the Act, 21 U.S.C. section 360bbb-3(b)(1), unless the authorization is terminated or revoked.     Resp Syncytial Virus by PCR NEGATIVE NEGATIVE Final    Comment: (NOTE) Fact Sheet for Patients: BloggerCourse.com  Fact Sheet for Healthcare Providers: SeriousBroker.it  This test is not yet approved or cleared by the [place]  FDA and has been authorized for detection and/or diagnosis of SARS-CoV-2 by FDA under an Emergency Use Authorization (EUA). This EUA will remain in effect (meaning this test can be used) for the duration of the COVID-19 declaration under Section 564(b)(1) of the Act, 21 U.S.C. section 360bbb-3(b)(1), unless the authorization is terminated or revoked.  Performed at [facility], [address], [place], [place] [address]      Scheduled Meds:  aspirin  EC  81 mg Oral Q breakfast   atorvastatin   40 mg Oral QHS   cilostazol   100 mg Oral BID   clopidogrel   75 mg Oral q AM   feeding supplement  237 mL Oral BID BM   heparin   5,000 Units Subcutaneous Q8H   insulin  aspart  0-5 Units Subcutaneous QHS    insulin  aspart  0-9 Units Subcutaneous TID WC   insulin  glargine  15 Units Subcutaneous QHS   metoprolol  succinate  25 mg Oral Daily   multivitamin with minerals  1 tablet Oral Daily   polyethylene glycol  17 g Oral BID   Continuous Infusions:  sodium chloride  75 mL/hr at 05/16/24 0602   ampicillin-sulbactam (UNASYN) IV 3 g (05/16/24 0605)   lactated ringers      linezolid (ZYVOX) IV      Procedures/Studies: DG Neck Soft Tissue Result Date: 05/16/2024 CLINICAL DATA:  Edema due to infection. EXAM: NECK SOFT TISSUES - 1+ VIEW COMPARISON:  None Available. FINDINGS: Limited assessment due to patient positioning and obscuration of the mid and lower neck by the patient's shoulders. Telemetry leads and snaps project over the anterior soft tissues of the lower face and neck. No evidence for prevertebral soft tissue swelling anterior to the C1 or C2 vertebral bodies. Study is nondiagnostic for assessment of prevertebral soft tissues inferior to this level. IMPRESSION: Markedly limited assessment due to patient positioning and obscuration of the mid and lower neck by the patient's shoulders. No evidence for prevertebral soft tissue swelling anterior to the C1 or C2 vertebral bodies although exam is otherwise nondiagnostic. Given the history of soft tissue swelling, CT neck with contrast could be used to further evaluate. Electronically Signed   By: Camellia Candle M.D.   On: 05/16/2024 05:59   CT Head Wo Contrast Result Date: 05/13/2024 CLINICAL DATA:  Worsening health over the past month with decreased appetite, nighttime confusion, unsteady gait and tremors. EXAM: CT HEAD WITHOUT CONTRAST TECHNIQUE: Contiguous axial images were obtained from the base of the skull through the vertex without intravenous contrast. RADIATION DOSE REDUCTION: This exam was performed according to the departmental dose-optimization program which includes automated exposure control, adjustment of the mA and/or kV according to patient  size and/or use of iterative reconstruction technique. COMPARISON:  03/29/2024 FINDINGS: Brain: Ventricles, cisterns and other CSF spaces are normal. Cavum septum variant is present. Very minimal chronic ischemic  microvascular disease. 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 No mass, mass effect, shift of midline structures or acute hemorrhage. No evidence of acute infarction. Vascular: No hyperdense vessel or unexpected calcification. Skull: Normal. Negative for fracture or focal lesion. Sinuses/Orbits: No acute finding. Other: None. IMPRESSION: 1. No acute findings. 2. Very minimal chronic ischemic microvascular disease. 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 Electronically Signed   By: Toribio Agreste M.D.   On: 05/13/2024 15:48   DG Chest Port 1 View Result Date: 05/13/2024 CLINICAL DATA:  Weakness EXAM: PORTABLE CHEST 1 VIEW COMPARISON:  Chest x-ray performed April 14, 2024 FINDINGS: Postsurgical changes from sternotomy. Low lung volumes. Similar appearance of interstitial airspace opacities. Elevated left hemidiaphragm. IMPRESSION: 1. Low lung volumes. 2. Grossly similar accounting for variation in technique. Electronically Signed   By: Maude Naegeli M.D.   On: 05/13/2024 13:21   VAS US  ABI WITH/WO TBI Result Date: 05/13/2024  LOWER EXTREMITY DOPPLER STUDY Patient Name:  ZEPHAN BEAUCHAINE  Date of Exam:   05/13/2024 Medical Rec #: [id number]     Accession #:    [id number] Date of Birth: 11-11-1945     Patient Gender: M Patient Age:   66 years Exam Location:  [address] Procedure:      VAS US  ABI WITH/WO TBI Referring Phys: LONNI GASKINS --------------------------------------------------------------------------------  Indications: Peripheral artery disease.  Vascular Interventions: 04/17/24: DCBA right CIA and EIA stents.                         09/22/21: Right CIA and EIA angioplasty with CIA stent.                         08/09/16: Right iliofemoral  endarterectomy.                         Bovine pericardial patch angioplasty, right common                         femoral artery.                         Redo right limb of right to left femoral-femoral bypass                         graft. Performing Technologist: King Pierre RVT  Examination Guidelines: A complete evaluation includes at minimum, Doppler waveform signals and systolic blood pressure reading at the level of bilateral brachial, anterior tibial, and posterior tibial arteries, when vessel segments are accessible. Bilateral testing is considered an integral part of a complete examination. Photoelectric Plethysmograph (PPG) waveforms and toe systolic pressure readings are included as required and additional duplex testing as needed. Limited examinations for reoccurring indications may be performed as noted.  ABI Findings: +---------+------------------+-----+----------+--------+ Right    Rt Pressure (mmHg)IndexWaveform  Comment  +---------+------------------+-----+----------+--------+ Brachial 157                                       +---------+------------------+-----+----------+--------+ PTA      254               1.62 monophasic         +---------+------------------+-----+----------+--------+ DP  85                0.54 monophasic         +---------+------------------+-----+----------+--------+ Great Toe0                 0.00                    +---------+------------------+-----+----------+--------+ +---------+------------------+-----+----------+-------+ Left     Lt Pressure (mmHg)IndexWaveform  Comment +---------+------------------+-----+----------+-------+ Brachial 153                                      +---------+------------------+-----+----------+-------+ PTA      254               1.62 monophasic        +---------+------------------+-----+----------+-------+ DP       254               1.62 monophasic         +---------+------------------+-----+----------+-------+ Great Toe0                 0.00                   +---------+------------------+-----+----------+-------+ +-------+-----------+-----------+------------+------------+ ABI/TBIToday's ABIToday's TBIPrevious ABIPrevious TBI +-------+-----------+-----------+------------+------------+ Right  [place]                    0.7         0.28         +-------+-----------+-----------+------------+------------+ Left   [place]                    [place]          0.18         +-------+-----------+-----------+------------+------------+  Previous ABI on 01/22/24 0.7/0.28 [place]/0.18.  Summary: Right: Resting right ankle-brachial index indicates noncompressible right lower extremity arteries. The right toe-brachial index is abnormal.  Left: Resting left ankle-brachial index indicates noncompressible left lower extremity arteries. The left toe-brachial index is abnormal.  *See table(s) above for measurements and observations.  Electronically signed by Lonni Gaskins MD on 05/13/2024 at 12:34:42 PM.    Final    VAS US  AORTA/IVC/ILIACS Result Date: 05/13/2024 ABDOMINAL AORTA STUDY Patient Name:  NOLTON DENIS  Date of Exam:   05/13/2024 Medical Rec #: [id number]     Accession #:    [id number] Date of Birth: 1946-02-25     Patient Gender: M Patient Age:   60 years Exam Location:  [address] Procedure:      VAS US  AORTA/IVC/ILIACS Referring Phys: LONNI GASKINS --------------------------------------------------------------------------------  Vascular Interventions: 04/17/24: DCBA right CIA and EIA stents.                         09/22/21: Right CIA and EIA angioplasty with CIA stent.                         08/09/16: Right iliofemoral endarterectomy.                         Bovine pericardial patch angioplasty, right common                         femoral artery.  Redo right limb of right to left femoral-femoral bypass                         graft.   Comparison Study: 01/22/24: Patent right CIA and EIA stents with elevated                   velocities in the EIA in the > 50% range. Patent right to left                   fem- fem BPG with 50-74% stenosis at both anastomoses. Performing Technologist: King Pierre RVT  Examination Guidelines: A complete evaluation includes B-mode imaging, spectral Doppler, color Doppler, and power Doppler as needed of all accessible portions of each vessel. Bilateral testing is considered an integral part of a complete examination. Limited examinations for reoccurring indications may be performed as noted.  Abdominal Aorta Findings: +-------------+-------+----------+----------+----------+--------+---------+ Location     AP (cm)Trans (cm)PSV (cm/s)Waveform  ThrombusComments  +-------------+-------+----------+----------+----------+--------+---------+ RT CIA Prox                   217       biphasic                    +-------------+-------+----------+----------+----------+--------+---------+ RT CIA Mid                    299       monophasic                  +-------------+-------+----------+----------+----------+--------+---------+ RT CIA Distal                 154       monophasic        turbulent +-------------+-------+----------+----------+----------+--------+---------+ RT EIA Prox                   467       monophasic                  +-------------+-------+----------+----------+----------+--------+---------+ RT EIA Mid                    388       monophasic                  +-------------+-------+----------+----------+----------+--------+---------+ RT EIA Distal                 383       monophasic                  +-------------+-------+----------+----------+----------+--------+---------+ Right Graft #1: +---------------------+--------+--------+----------+--------+ Right to left fem-femPSV cm/sStenosisWaveform  Comments  +---------------------+--------+--------+----------+--------+ Inflow               383             monophasic         +---------------------+--------+--------+----------+--------+ Prox Anastomosis     272             monophasic         +---------------------+--------+--------+----------+--------+ Proximal Graft       104             monophasic         +---------------------+--------+--------+----------+--------+ Mid Graft            60              monophasic         +---------------------+--------+--------+----------+--------+ Distal Graft  62              biphasic           +---------------------+--------+--------+----------+--------+ Distal Anastomosis   222             biphasic           +---------------------+--------+--------+----------+--------+ Outflow              367             monophasic         +---------------------+--------+--------+----------+--------+  Summary: Stenosis: Elevated velocities in the CIA with significant elevation in the EIA. Patent right to left fem-fem with inflow stenosis (EIA)and outflow stenosis (SFA).  *See table(s) above for measurements and observations.  Electronically signed by Lonni Gaskins MD on 05/13/2024 at 12:01:43 PM.    Final    PERIPHERAL VASCULAR CATHETERIZATION Result Date: 04/17/2024 Images from the original result were not included.   Patient name: METRO EDENFIELD    MRN: [id number]        DOB: Aug 19, 1945          Sex: male  04/17/2024 Pre-operative Diagnosis: Threatened right to left femoral-femoral bypass with high-grade stenosis in right iliac stents Post-operative diagnosis:  Same Surgeon:  Lonni DOROTHA Gaskins, MD Procedure Performed: 1.  Ultrasound-guided access right to left femorofemoral bypass retrograde 2.  Aortogram with catheter selection of aorta 3.  Drug-coated balloon angioplasty right common and external iliac artery stents (7 mm x 150 mm drug-coated Ranger) 4.  34 minutes of monitored moderate  conscious sedation time  Indications: 78 year old male who previously underwent a right to left femoral-femoral bypass by Dr. Dyane years ago.  He had multiple stenting procedures of his right common and external iliac artery.  Recent duplex showed increasing stenosis in the right iliac stents with velocities near 500 suggesting high-grade stenosis.  He presents for lower extremity angiogram with a focus on the femorofemoral bypass and right iliac stents which is the inflow after risks benefits discussed.  Findings:  Ultrasound-guided access right to left femorofemoral bypass retrograde.  Tried to use CO2 but could not get any good imaging.  Angiogram ultimately showed diffuse disease throughout the right common and external iliac stents with what look like at least 50% stenosis in the proximal external iliac artery near the hypogastric.  Ultimately elected to treat all this with 7 mm x 150 mm drug-coated Ranger for prolonged inflation of 3 minutes.  Widely patent stents at completion.  Patent femorofemoral bypass.  Profunda runoff bilaterally with occluded SFAs.             Procedure:  The patient was identified in the holding area and taken to room 8.  The patient was then placed supine on the table and prepped and draped in the usual sterile fashion.  A time out was called.  Patient received Versed  and fentanyl  for conscious moderate sedation.  Vital signs are monitored including heart rate, respiratory rate, oxygenation blood pressure.  I was present for all of moderate sedation.  Ultrasound was used to evaluate the femoral femoral bypasss, it was patent, an image was saved.  This was accessed with a micro access needle placed a microwire and micro sheath.  I then used a Glidewire advantage and upsized to a short 5 French sheath in the femoral femoral bypass.  The patient was given 100 units/kg IV heparin .  I then used a KMP catheter to drive the Glidewire advantage retrograde through the right iliac stents and  exchanged for an Omni Flush catheter in the abdominal aorta.  Initially tried to use CO2 due to chronic kidney disease.  Due to bowel gas there was no visualization of the iliac stents.  Ultimately elected for contrast injection.  Ultimately as noted above we elected for drug-coated balloon angioplasty and exchanged for a V18 wire up into the aorta through the right iliac stents.  I then used a 7 mm x 150 mm drug-coated Ranger inflated to nominal pressure for 3 minutes.  Final imaging showed widely patent stents.  I did get an additional image of the left groin to look at the outflow of the femorofemoral bypass.  He has profunda runoff bilaterally.  Wires and catheters were removed.  Will be taken to holding for sheath removal.  Plan: Excellent results after right iliac artery drug-coated balloon angioplasty.  Plan aspirin  statin Plavix .  Arrange follow-up in 1 month.     Lonni DOROTHA Gaskins, MD [facility] Office: [phone]    Alm Schneider, DO  [facility]  If 7PM-7AM, please contact night-coverage [email or web address] Password [email or web address] 05/16/2024, 7:53 AM   LOS: 3 days

## 2024-05-16 NOTE — Telephone Encounter (Signed)
 Patient Product/process development scientist completed.    The patient is insured through [employer]. Patient has Medicare and is not eligible for a copay card, but may be able to apply for patient assistance or Medicare RX Payment Plan (Patient Must reach out to their plan, if eligible for payment plan), if available.    Ran test claim for linezolid 600 mg and the current 10 day co-pay is $25.53.   This test claim was processed through [facility]- copay amounts may vary at other pharmacies due to pharmacy/plan contracts, or as the patient moves through the different stages of their insurance plan.     Reyes Sharps, CPHT Pharmacy Technician III Certified Patient Advocate [facility] Patient Advocate Team Direct Number: [phone]  Fax: [phone]

## 2024-05-16 NOTE — Significant Event (Addendum)
       CROSS COVER NOTE  NAME: [name] MRN: [id number] DOB : Jul 19, 1946 ATTENDING PHYSICIAN: Evonnie Lenis, MD    Date of Service   05/16/2024   HPI/Events of Note   Remote cross cover for [name]: Message received from nurse regarding significant right face and jaw edema Pic taken and in chart media - viewed Per nurse no tongue or airway involvement  HPI reviewed - hx of CAD s/p CABG, CKD IV, DM2, HTN, hyperlipidemia, COPD, PAD, atrial tachycardial, diverticulitis s/p colostomy with takedown and TIA. [name] presented to hospital with generalized weakness, confusion, decreased oral intake and difficulty ambulating. He has head recent hospitalization in [place] for CAP. Patient found to have leukocytosis with elevated lactic acid and urinalysis consistent with UTI.  He was started on IV fluids and IV Rocephin . Workup for metabolic encephalopathy still in progress    Latest Ref Rng & Units 05/16/2024    4:14 AM 05/15/2024    4:14 AM 05/14/2024    4:11 AM  CBC  WBC 4.0 - 10.5 K/uL 16.7  15.5  16.0   Hemoglobin 13.0 - 17.0 g/dL 8.5  8.4  8.6   Hematocrit 39.0 - 52.0 % 28.4  27.8  28.4   Platelets 150 - 400 K/uL 441  449  436      Interventions   Assessment/Plan:    05/16/2024    4:46 AM 05/15/2024    7:56 PM 05/15/2024   12:32 PM  Vitals with BMI  Systolic 160 154 837  Diastolic 72 75 59  Pulse 94 93 89   TEMP                     97.7   Changed antibiotic to Unasyn and vancomycin - dose per pharmacy consult Soft tissue xray ordered Viral panel PCR ordered MRSA screen - may need d/c Vanc if negative Anthony [name] informed of above via secure chat and staff message sent to attending [name]     [name] [facility] NP [facility] Cross Cover 7pm-7am - check amion for availability Pager [phone]

## 2024-05-16 NOTE — Progress Notes (Signed)
 Wife called and updated regarding implementation of Droplet and testing of MRSA and Resp panel. Informed of swelling to the right side of the face and changes to antibiotics prescribed.

## 2024-05-16 NOTE — Plan of Care (Signed)
 Pt remains confused but cooperative. Incontinent during overnight. Swelling noted to the right side of his face in the parotid glad area. Picture taken and placed on chart. On call Provider. WENDI [facility] NP notified.  Problem: Education: Goal: Knowledge of General Education information will improve Description: Including pain rating scale, medication(s)/side effects and non-pharmacologic comfort measures Outcome: Progressing   Problem: Health Behavior/Discharge Planning: Goal: Ability to manage health-related needs will improve Outcome: Progressing   Problem: Clinical Measurements: Goal: Ability to maintain clinical measurements within normal limits will improve Outcome: Progressing Goal: Will remain free from infection Outcome: Progressing Goal: Diagnostic test results will improve Outcome: Progressing Goal: Respiratory complications will improve Outcome: Progressing Goal: Cardiovascular complication will be avoided Outcome: Progressing   Problem: Activity: Goal: Risk for activity intolerance will decrease Outcome: Progressing   Problem: Nutrition: Goal: Adequate nutrition will be maintained Outcome: Progressing   Problem: Coping: Goal: Level of anxiety will decrease Outcome: Progressing   Problem: Elimination: Goal: Will not experience complications related to bowel motility Outcome: Progressing Goal: Will not experience complications related to urinary retention Outcome: Progressing   Problem: Pain Managment: Goal: General experience of comfort will improve and/or be controlled Outcome: Progressing   Problem: Safety: Goal: Ability to remain free from injury will improve Outcome: Progressing   Problem: Skin Integrity: Goal: Risk for impaired skin integrity will decrease Outcome: Progressing   Problem: Education: Goal: Ability to describe self-care measures that may prevent or decrease complications (Diabetes Survival Skills Education) will improve Outcome:  Progressing Goal: Individualized Educational Video(s) Outcome: Progressing   Problem: Coping: Goal: Ability to adjust to condition or change in health will improve Outcome: Progressing   Problem: Fluid Volume: Goal: Ability to maintain a balanced intake and output will improve Outcome: Progressing   Problem: Health Behavior/Discharge Planning: Goal: Ability to identify and utilize available resources and services will improve Outcome: Progressing Goal: Ability to manage health-related needs will improve Outcome: Progressing   Problem: Metabolic: Goal: Ability to maintain appropriate glucose levels will improve Outcome: Progressing   Problem: Nutritional: Goal: Maintenance of adequate nutrition will improve Outcome: Progressing Goal: Progress toward achieving an optimal weight will improve Outcome: Progressing   Problem: Skin Integrity: Goal: Risk for impaired skin integrity will decrease Outcome: Progressing   Problem: Tissue Perfusion: Goal: Adequacy of tissue perfusion will improve Outcome: Progressing

## 2024-05-16 NOTE — Progress Notes (Signed)
 On call provider B. Jesus NP changed antibiotic therapy due to new onset of swelling to right side of face. Pt educated about lying on the right side. Encouraged to lay supine. Improvement noted in the right side of face after 2 hours with position changed. Dr. Adefeso came to bedside to assess. MRSA swab and Resp panel completed. Droplet precautions implemented. 1 dose of unasyn administered. Xray soft tissue neck completed and NP B Jesus aware that had resulted.

## 2024-05-16 NOTE — Progress Notes (Signed)
 Pharmacy Antibiotic Note  [name] is a 78 y.o. male with facial swelling possible parotitis .  Pharmacy has been consulted for Vancomycin  dosing.  Plan: Vancomycin  1250 mg IV now, then 750 mg IV q48h F/U renal function  Height: 5' 7 (170.2 cm) Weight: 62.6 kg (138 lb 0.1 oz) IBW/kg (Calculated) : 66.1  Temp (24hrs), Avg:97.9 F (36.6 C), Min:97.5 F (36.4 C), Max:98.5 F (36.9 C)  Recent Labs  Lab 05/13/24 1240 05/13/24 1319 05/13/24 1441 05/14/24 0411 05/15/24 0414 05/16/24 0414  WBC 16.7*  --   --  16.0* 15.5* 16.7*  CREATININE 5.48*  --   --  4.26* 3.32* 2.79*  LATICACIDVEN  --  2.6* 1.6  --   --   --     Estimated Creatinine Clearance: 19.9 mL/min (A) (by C-G formula based on SCr of 2.79 mg/dL (H)).    Allergies  Allergen Reactions   Baclofen Other (See Comments)    Altered mental status, extreme drowsiness   Acarbose Diarrhea     [name] 05/16/2024 6:48 AM

## 2024-05-16 NOTE — Progress Notes (Signed)
 CT neck and maxillofacial results noted--  Enlarged right parotid gland with increased attenuation and adjacent subcutaneous fat stranding, without mass or suspicious lymphadenopathy. Findings are suggestive of cellulitis with possible parotiditis. mildly displaced fracture of the C6 spinous process was detected by AIDOC  -I discussed with neurosurgery -pt has minimal to no pain -per neurosurgery--continue operative treatment; can use cervical collar if significant pain --wife and patient updated  DTat

## 2024-05-17 ENCOUNTER — Inpatient Hospital Stay (HOSPITAL_COMMUNITY): Admitting: Anesthesiology

## 2024-05-17 ENCOUNTER — Inpatient Hospital Stay (HOSPITAL_COMMUNITY)
Admit: 2024-05-17 | Discharge: 2024-05-17 | Disposition: A | Discharge status: Home / Self Care | Attending: Neurology | Admitting: Neurology

## 2024-05-17 ENCOUNTER — Inpatient Hospital Stay (HOSPITAL_COMMUNITY)

## 2024-05-17 DIAGNOSIS — N390 Urinary tract infection, site not specified: Secondary | ICD-10-CM

## 2024-05-17 DIAGNOSIS — N178 Other acute kidney failure: Secondary | ICD-10-CM | POA: Diagnosis not present

## 2024-05-17 DIAGNOSIS — G9340 Encephalopathy, unspecified: Secondary | ICD-10-CM | POA: Diagnosis not present

## 2024-05-17 DIAGNOSIS — N3000 Acute cystitis without hematuria: Secondary | ICD-10-CM | POA: Diagnosis not present

## 2024-05-17 DIAGNOSIS — N184 Chronic kidney disease, stage 4 (severe): Secondary | ICD-10-CM | POA: Diagnosis not present

## 2024-05-17 DIAGNOSIS — R410 Disorientation, unspecified: Secondary | ICD-10-CM

## 2024-05-17 LAB — CBC
HCT: 27.3 % — ABNORMAL LOW (ref 39.0–52.0)
HCT: 27.1 % — ABNORMAL LOW (ref 39.0–52.0)
Hemoglobin: 8.4 g/dL — ABNORMAL LOW (ref 13.0–17.0)
Hemoglobin: 8 g/dL — ABNORMAL LOW (ref 13.0–17.0)
MCH: 25.1 pg — ABNORMAL LOW (ref 26.0–34.0)
MCH: 24.3 pg — ABNORMAL LOW (ref 26.0–34.0)
MCHC: 30.8 g/dL (ref 30.0–36.0)
MCHC: 29.5 g/dL — ABNORMAL LOW (ref 30.0–36.0)
MCV: 81.5 fL (ref 80.0–100.0)
MCV: 82.4 fL (ref 80.0–100.0)
Platelets: 427 K/uL — ABNORMAL HIGH (ref 150–400)
Platelets: 423 K/uL — ABNORMAL HIGH (ref 150–400)
RBC: 3.35 MIL/uL — ABNORMAL LOW (ref 4.22–5.81)
RBC: 3.29 MIL/uL — ABNORMAL LOW (ref 4.22–5.81)
RDW: 19.9 % — ABNORMAL HIGH (ref 11.5–15.5)
RDW: 19.8 % — ABNORMAL HIGH (ref 11.5–15.5)
WBC: 20.7 K/uL — ABNORMAL HIGH (ref 4.0–10.5)
WBC: 20 K/uL — ABNORMAL HIGH (ref 4.0–10.5)
nRBC: 0 % (ref 0.0–0.2)
nRBC: 0 % (ref 0.0–0.2)

## 2024-05-17 LAB — BASIC METABOLIC PANEL WITH GFR
Anion gap: 18 — ABNORMAL HIGH (ref 5–15)
BUN: 36 mg/dL — ABNORMAL HIGH (ref 8–23)
CO2: 16 mmol/L — ABNORMAL LOW (ref 22–32)
Calcium: 9.5 mg/dL (ref 8.9–10.3)
Chloride: 105 mmol/L (ref 98–111)
Creatinine, Ser: 2.59 mg/dL — ABNORMAL HIGH (ref 0.61–1.24)
GFR, Estimated: 25 mL/min — ABNORMAL LOW (ref >60)
Glucose, Bld: 72 mg/dL (ref 70–99)
Potassium: 3.9 mmol/L (ref 3.5–5.1)
Sodium: 140 mmol/L (ref 135–145)

## 2024-05-17 LAB — AMMONIA: Ammonia: <13 umol/L (ref 9–35)

## 2024-05-17 LAB — BLOOD GAS, ARTERIAL
Acid-base deficit: 2.1 mmol/L — ABNORMAL HIGH (ref 0.0–2.0)
Bicarbonate: 21.1 mmol/L (ref 20.0–28.0)
Drawn by: 27016
O2 Saturation: 90.8 %
Patient temperature: 36.7
pCO2 arterial: 31 mmHg — ABNORMAL LOW (ref 32–48)
pH, Arterial: 7.44 (ref 7.35–7.45)
pO2, Arterial: 59 mmHg — ABNORMAL LOW (ref 83–108)

## 2024-05-17 LAB — HEPATIC FUNCTION PANEL
ALT: 8 U/L (ref 0–44)
AST: 23 U/L (ref 15–41)
Albumin: 3.4 g/dL — ABNORMAL LOW (ref 3.5–5.0)
Alkaline Phosphatase: 122 U/L (ref 38–126)
Bilirubin, Direct: 0.2 mg/dL (ref 0.0–0.2)
Indirect Bilirubin: 0.2 mg/dL — ABNORMAL LOW (ref 0.3–0.9)
Total Bilirubin: 0.4 mg/dL (ref 0.0–1.2)
Total Protein: 7.1 g/dL (ref 6.5–8.1)

## 2024-05-17 LAB — MAGNESIUM: Magnesium: 2 mg/dL (ref 1.7–2.4)

## 2024-05-17 LAB — GLUCOSE, CAPILLARY
Glucose-Capillary: 67 mg/dL — ABNORMAL LOW (ref 70–99)
Glucose-Capillary: 123 mg/dL — ABNORMAL HIGH (ref 70–99)
Glucose-Capillary: 128 mg/dL — ABNORMAL HIGH (ref 70–99)
Glucose-Capillary: 109 mg/dL — ABNORMAL HIGH (ref 70–99)
Glucose-Capillary: 118 mg/dL — ABNORMAL HIGH (ref 70–99)
Glucose-Capillary: 126 mg/dL — ABNORMAL HIGH (ref 70–99)
Glucose-Capillary: 132 mg/dL — ABNORMAL HIGH (ref 70–99)

## 2024-05-17 MED ORDER — THIAMINE HCL 100 MG/ML IJ SOLN
500.0000 mg | Freq: Three times a day (TID) | INTRAVENOUS | Status: AC
  Administered 2024-05-17 – 2024-05-19 (×6): 500 mg via INTRAVENOUS
  Filled 2024-05-17: qty 500
  Filled 2024-05-17: qty 5
  Filled 2024-05-17: qty 466.67
  Filled 2024-05-17: qty 500
  Filled 2024-05-17: qty 5
  Filled 2024-05-17: qty 466.67

## 2024-05-17 MED ORDER — FENTANYL 2500MCG IN NS 250ML (10MCG/ML) PREMIX INFUSION
0.0000 ug/h | INTRAVENOUS | Status: DC
  Administered 2024-05-17: 50 ug/h via INTRAVENOUS
  Administered 2024-05-19 – 2024-05-20 (×2): 75 ug/h via INTRAVENOUS
  Administered 2024-05-21: 100 ug/h via INTRAVENOUS
  Administered 2024-05-21: 150 ug/h via INTRAVENOUS
  Administered 2024-05-22 – 2024-05-23 (×2): 200 ug/h via INTRAVENOUS
  Administered 2024-05-23: 100 ug/h via INTRAVENOUS
  Administered 2024-05-24: 150 ug/h via INTRAVENOUS
  Filled 2024-05-17 (×10): qty 250

## 2024-05-17 MED ORDER — THIAMINE HCL 100 MG/ML IJ SOLN
250.0000 mg | Freq: Every day | INTRAVENOUS | Status: DC
  Administered 2024-05-20 – 2024-05-23 (×4): 250 mg via INTRAVENOUS
  Filled 2024-05-17 (×5): qty 2.5

## 2024-05-17 MED ORDER — DOCUSATE SODIUM 50 MG/5ML PO LIQD
100.0000 mg | Freq: Two times a day (BID) | ORAL | Status: DC
  Administered 2024-05-18 (×2): 100 mg
  Filled 2024-05-17 (×2): qty 10

## 2024-05-17 MED ORDER — LABETALOL HCL 5 MG/ML IV SOLN
5.0000 mg | INTRAVENOUS | Status: DC | PRN
  Administered 2024-05-17: 5 mg via INTRAVENOUS
  Filled 2024-05-17: qty 4

## 2024-05-17 MED ORDER — FENTANYL CITRATE PF 50 MCG/ML IJ SOSY: 25.0000 ug | PREFILLED_SYRINGE | Freq: Once | INTRAMUSCULAR | Status: DC

## 2024-05-17 MED ORDER — FENTANYL BOLUS VIA INFUSION
25.0000 ug | INTRAVENOUS | Status: DC | PRN
  Administered 2024-05-17 – 2024-05-18 (×3): 50 ug via INTRAVENOUS
  Administered 2024-05-18 – 2024-05-19 (×2): 75 ug via INTRAVENOUS
  Administered 2024-05-19 – 2024-05-20 (×7): 100 ug via INTRAVENOUS
  Administered 2024-05-20: 75 ug via INTRAVENOUS
  Administered 2024-05-20: 100 ug via INTRAVENOUS
  Administered 2024-05-20: 50 ug via INTRAVENOUS
  Administered 2024-05-20 – 2024-05-21 (×4): 100 ug via INTRAVENOUS
  Administered 2024-05-21: 50 ug via INTRAVENOUS
  Administered 2024-05-21 (×2): 100 ug via INTRAVENOUS
  Administered 2024-05-21: 50 ug via INTRAVENOUS
  Administered 2024-05-21 (×3): 100 ug via INTRAVENOUS
  Administered 2024-05-22 (×2): 50 ug via INTRAVENOUS
  Administered 2024-05-22: 100 ug via INTRAVENOUS
  Administered 2024-05-22: 50 ug via INTRAVENOUS
  Administered 2024-05-22: 100 ug via INTRAVENOUS
  Administered 2024-05-23: 50 ug via INTRAVENOUS
  Administered 2024-05-23: 100 ug via INTRAVENOUS
  Administered 2024-05-23: 50 ug via INTRAVENOUS
  Administered 2024-05-23: 100 ug via INTRAVENOUS
  Administered 2024-05-23 – 2024-05-24 (×3): 50 ug via INTRAVENOUS
  Administered 2024-05-24 (×7): 100 ug via INTRAVENOUS

## 2024-05-17 MED ORDER — PROCHLORPERAZINE EDISYLATE 10 MG/2ML IJ SOLN
10.0000 mg | INTRAMUSCULAR | Status: DC | PRN
  Administered 2024-05-17: 10 mg via INTRAVENOUS
  Filled 2024-05-17: qty 2

## 2024-05-17 MED ORDER — PANTOPRAZOLE SODIUM 40 MG IV SOLR
40.0000 mg | INTRAVENOUS | Status: DC
  Administered 2024-05-18: 40 mg via INTRAVENOUS
  Filled 2024-05-17: qty 10

## 2024-05-17 MED ORDER — DEXTROSE 50 % IV SOLN
12.5000 g | INTRAVENOUS | Status: AC
  Administered 2024-05-17: 12.5 g via INTRAVENOUS
  Filled 2024-05-17: qty 50

## 2024-05-17 MED ORDER — FUROSEMIDE 10 MG/ML IJ SOLN
60.0000 mg | Freq: Once | INTRAMUSCULAR | Status: AC
  Administered 2024-05-18: 60 mg via INTRAVENOUS
  Filled 2024-05-17: qty 6

## 2024-05-17 MED ORDER — PROPOFOL 1000 MG/100ML IV EMUL
0.0000 ug/kg/min | INTRAVENOUS | Status: DC
  Administered 2024-05-17: 10 ug/kg/min via INTRAVENOUS
  Administered 2024-05-18: 35 ug/kg/min via INTRAVENOUS
  Administered 2024-05-18 – 2024-05-19 (×2): 45 ug/kg/min via INTRAVENOUS
  Administered 2024-05-19: 55 ug/kg/min via INTRAVENOUS
  Administered 2024-05-19: 45 ug/kg/min via INTRAVENOUS
  Administered 2024-05-19: 35 ug/kg/min via INTRAVENOUS
  Administered 2024-05-19: 40 ug/kg/min via INTRAVENOUS
  Administered 2024-05-20: 15 ug/kg/min via INTRAVENOUS
  Administered 2024-05-20: 40 ug/kg/min via INTRAVENOUS
  Administered 2024-05-20: 45 ug/kg/min via INTRAVENOUS
  Administered 2024-05-20: 40 ug/kg/min via INTRAVENOUS
  Administered 2024-05-21: 60 ug/kg/min via INTRAVENOUS
  Filled 2024-05-17 (×15): qty 100

## 2024-05-17 MED ORDER — CHLORHEXIDINE GLUCONATE CLOTH 2 % EX PADS
6.0000 | MEDICATED_PAD | Freq: Every day | CUTANEOUS | Status: DC
  Administered 2024-05-18 – 2024-05-23 (×5): 6 via TOPICAL

## 2024-05-17 NOTE — Progress Notes (Signed)
 Noted patient suck in the bed this morning, patient pulled up in bed. Patient alert at times, breathing abnormal at times. Patient had received D50 12.5 g at 0734 due to CBG 67 and patient unable to tolerate swallowing 4 ozs of soda. Vital signs: BP 165/75, R-103, R-20, O2- 86% on Room air. Patient placed on 2 liters. Oxygen saturation increased to 90% on 2 liters. MD Tat made aware. Attempted to give patient morning medications two pills at a time noted that patient had to be informed to swallow pills, patient given breaks between pills. Shortly after patient had received a few pills, patient had emesis x 2 dark brown in color. Patient had coke cola and ensure at bedside.  This writer unable to give patient some PO medications, see MAR. MD Tat made aware. New orders placed. Patient given IV Compazine at [phone]. Noted patient had transfer order to transfer to [facility], called report at 1007. Contacted patients spouse Aaden Buckman and made her aware of patients transfer, family provided patients room number at [facility].

## 2024-05-17 NOTE — Progress Notes (Addendum)
 eLink Physician-Brief Progress Note Patient Name: [name] DOB: 1946-03-02 MRN: [id number]   Date of Service  05/17/2024  HPI/Events of Note  78 year old male with a history of CABG, metabolic syndrome, CKD, COPD, severe diverticulitis and previous TIA who presents with encephalopathy and reduced oral intake that develops a PEA cardiac arrest and is transferred to the ICU for further care.  Vitals, results, imaging reviewed  eICU Interventions  Maintain mechanical ventilation, daily spontaneous awakening/breathing trial, diuretics  Broad-spectrum antibiotics in the setting of parotiditis and UTI  Long-term EEG monitoring, neuro to follow, maintain normothermia  DVT prophylaxis with heparin  GI prophylaxis-add Protonix  Protonix    0141 -bloody coffee-ground output from NG tube, tube okay to use, add soft wrist restraints for patient safety  0213 - NE order missing, IO in place, ordered  0308 - Post arrest EKG reviewed, no intervention     Hetvi Shawhan 05/17/2024, 11:39 PM

## 2024-05-17 NOTE — Progress Notes (Signed)
 Hypoglycemic Event  CBG: 67  Treatment: 4 oz juice/soda and D50 25 mL (12.5 gm)  Symptoms: None  Follow-up CBG: Time:0802 CBG Result:123  Possible Reasons for Event: Unknown  Comments/MD notified: Patients CBG was 67 mg/dL. Attempted to give patient 4 ozs of soda, patient took two sips and refused the rest. Noted patient not completely alert. Order placed to give patient D50 25 mL. Patient received D50 at 0734. Rechecked CBG was 123 mg/dL. MD Tat made aware.    Heinz JONELLE Arenas

## 2024-05-17 NOTE — Progress Notes (Addendum)
 PROGRESS NOTE  [name] FMW:[id number] DOB: 1946/02/05 DOA: 05/13/2024 PCP: [facility]  Brief History:  78 year old male with history of coronary artery disease status post CABG, diabetes mellitus type 2, hypertension, hyperlipidemia, CKD stage IV, COPD, peripheral arterial disease, atrial tachycardia, diverticulitis status post colostomy and subsequent takedown, TIA presenting with generalized weakness, confusion, decreased oral intake, and difficulty ambulating.  The patient was at the vascular surgery office when he was noted to have the above symptoms.  Family noted the patient has had general decline over the past 2 months.  He has had poor oral intake for a few weeks without any vomiting or diarrhea.  There has been no complaints of chest pain or abdominal pain.  Family has noted increasing confusion over the past few days prior to admission. Notably, the patient had a hospital admission from 03/21/2024 to 03/23/2024 during which time he was treated for community-acquired pneumonia and acute on chronic renal failure. In the ED, the patient was afebrile and hemodynamically stable with oxygen saturation 93-95% room air.  WBC 16.7, hemoglobin 9.1, platelets 544.  Sodium 132, potassium 4.6, bicarbonate 16, serum creatinine 5.48.  LFTs were unremarkable.  Troponin 129>>108.  Ammonia <13.  Lactic acid 2.6>>1.6 TSH 2.250.  CT brain was negative for any acute findings.  Chest x-ray showed chronic interstitial markings.  UA >50 WBC the patient was started on IV ceftriaxone  and IV fluids.  Review of the medical record shows that the patient has had intermittent episodes of delirium related to metabolic causes.  He has usually bounced back after treatment of his metabolic issues in the past.  During this hospitalization, the patient's mental status has gradually declined despite optimal treatment for his metabolic causes. Review of the medical record shows the patient has had  enhancing lesions in his lumbar and thoracic spine on MRI (12/07/23).  PET scan (12/27/23) also showed Multifocal mild areas of abnormal uptake along the skeleton including several ribs, vertebral bodies and scattered along the pelvis. Some of these lesions are associated with areas of bony sclerosis.  He had separate T9 and L3 bone biopsies without any metastatic carcinoma identified.  He has seen medical oncology and has undergone extensive workup including serum immunoglobulins and SPEP which have been negative.  In addition, tumor markers CEA, CA 19-9, alpha-fetoprotein, and PSA were all unremarkable. During this hospitalization, wife stated she has noticed some shaking in his right arm and right chest intermittently.     Assessment/Plan: Acute on chronic failure--CKD 4 - Baseline creatinine 1.9-2.2 - Presented with serum creatinine 5.48 - Secondary to volume depletion - Continue IV fluids>>improving   Acute metabolic encephalopathy - Secondary to AKI and UTI - B12--762 - Folic acid --12.6 - TSH--2.250 - ammonia <13 - UA >50WBC - Patient remains confused - MR brain--motion degraded, but neg for acute findings -no focal neuro deficits - Patient continues to be somnolent -Case was discussed with neurology, Dr. Beauford for consult after transfer -Patient will need EEG and LP due to concerns of paraneoplastic syndrome.   Right facial edema -CT maxillofacial--parotiditis without abscess. -10/2 xray-- No evidence for prevertebral soft tissue swelling anterior to the C1 orC2 vertebral bodies although exam is otherwise nondiagnostic -empiric zyvox/unasyn for now -COVID negative   C6 spinous process fracture -I discussed with neurosurgery -Patient has minimal pain -per neurosurgery--continue operative treatment; can use cervical collar if significant pain --wife and patient updated  UTI - UA>50 WBC - Continued  ceftriaxone  pending culture data - Follow-up blood cultures--neg to  date - urine culture neg--received 3 days ceftriaxone    Failure to thrive - B12--762 - Folic acid --12.6 - TSH--2.250 -ammonia <13 - UA >50WBC - PT evaluation>>SNF   Elevated troponin/CAD -Troponin 129>>108 - Secondary to demand ischemia - No chest pain presently - Personally reviewed EKG--sinus rhythm, no ST ST wave change -status post prior angioplasty and stent placement, status post CABG x 5    Lactic acidosis - Improved with IV fluids -Lactic acid 2.6>> 1.6   Peripheral arterial disease - Status post femorofemoral bypass - 04/17/2024--angioplasty right common iliac and external iliac arteries--Dr. Gretta - Continue aspirin , Plavix , Pletal    Essential hypertension - Continue metoprolol   - restart amlodipine  if BP poorly controlled   Mixed hyperlipidemia - Continue statin   Diabetes mellitus type 2 with hypoglycemia -Discontinue Lantus  secondary to hypoglycemia -Continue NovoLog  sliding scale         Family Communication:  wife 10/4   Consultants:  none   Code Status:  FULL   DVT Prophylaxis:  [place] Heparin      Procedures: As Listed in Progress Note Above   Antibiotics: Ceftriaxone  9/30>>10/2 Unasyn 10/3>> Zyvox 10/3>>      Subjective: Patient is pleasantly confused.  Denies any chest pain or shortness of breath.  Remainder review of systems unobtainable.  Objective: Vitals:   05/16/24 2255 05/17/24 0555 05/17/24 0736 05/17/24 0737  BP: (!) 169/82 (!) 161/73  (!) 165/75  Pulse: (!) 110 (!) 105  (!) 103  Resp:  20  20  Temp: 98.9 F (37.2 C) 98 F (36.7 C)    TempSrc: Oral     SpO2: 93% 92% (!) 86% 90%  Weight:      Height:        Intake/Output Summary (Last 24 hours) at 05/17/2024 0818 Last data filed at 05/16/2024 1500 Gross per 24 hour  Intake 1010.99 ml  Output --  Net 1010.99 ml   Weight change:  Exam:  General:  Pt is alert, follows commands appropriately, not in acute distress HEENT: No icterus, No thrush, No neck mass,  [place]/AT Cardiovascular: RRR, S1/S2, no rubs, no gallops Respiratory: Bibasilar rales.  No wheezing. Abdomen: Soft/+BS, non tender, non distended, no guarding Extremities: No edema, No lymphangitis, No petechiae, No rashes, no synovitis   Data Reviewed: I have personally reviewed following labs and imaging studies Basic Metabolic Panel: Recent Labs  Lab 05/13/24 1240 05/13/24 1250 05/14/24 0411 05/15/24 0414 05/16/24 0414 05/17/24 0517  NA 132*  --  138 137 139 140  K 4.6  --  4.4 4.3 4.0 3.9  CL 96*  --  103 104 106 105  CO2 16*  --  17* 18* 18* 16*  GLUCOSE 301*  --  104* 77 72 72  BUN 64*  --  59* 49* 41* 36*  CREATININE 5.48*  --  4.26* 3.32* 2.79* 2.59*  CALCIUM  9.4  --  9.1 9.5 9.6 9.5  MG  --  2.2  --  2.3 2.2 2.0   Liver Function Tests: Recent Labs  Lab 05/13/24 1240  AST 29  ALT 10  ALKPHOS 138*  BILITOT 0.4  PROT 7.8  ALBUMIN  3.7   No results for input(s): LIPASE, AMYLASE in the last 168 hours. Recent Labs  Lab 05/13/24 1319 05/17/24 0517  AMMONIA <13 <13   Coagulation Profile: No results for input(s): INR, PROTIME in the last 168 hours. CBC: Recent Labs  Lab 05/13/24 1240 05/14/24 0411 05/15/24  9585 05/16/24 0414 05/17/24 0517  WBC 16.7* 16.0* 15.5* 16.7* 20.7*  NEUTROABS 13.5*  --   --   --   --   HGB 9.1* 8.6* 8.4* 8.5* 8.4*  HCT 29.2* 28.4* 27.8* 28.4* 27.3*  MCV 79.8* 80.9 81.5 81.6 81.5  PLT 544* 436* 449* 441* 427*   Cardiac Enzymes: Recent Labs  Lab 05/14/24 0840  CKTOTAL 36*   BNP: Invalid input(s): POCBNP CBG: Recent Labs  Lab 05/16/24 1630 05/16/24 2059 05/16/24 2254 05/17/24 0717 05/17/24 0802  GLUCAP 77 89 94 67* 123*   HbA1C: No results for input(s): HGBA1C in the last 72 hours. Urine analysis:    Component Value Date/Time   COLORURINE YELLOW 05/13/2024 1525   APPEARANCEUR CLOUDY (A) 05/13/2024 1525   LABSPEC 1.016 05/13/2024 1525   PHURINE 5.0 05/13/2024 1525   GLUCOSEU >=500 (A) 05/13/2024  1525   HGBUR SMALL (A) 05/13/2024 1525   BILIRUBINUR NEGATIVE 05/13/2024 1525   KETONESUR NEGATIVE 05/13/2024 1525   PROTEINUR 30 (A) 05/13/2024 1525   UROBILINOGEN 0.2 03/16/2015 0120   NITRITE NEGATIVE 05/13/2024 1525   LEUKOCYTESUR MODERATE (A) 05/13/2024 1525   Sepsis Labs: @LABRCNTIP (procalcitonin:4,lacticidven:4) ) Recent Results (from the past 240 hours)  Culture, blood (routine x 2)     Status: None (Preliminary result)   Collection Time: 05/13/24  2:43 PM   Specimen: BLOOD  Result Value Ref Range Status   Specimen Description BLOOD BLOOD RIGHT HAND  Final   Special Requests   Final    BOTTLES DRAWN AEROBIC AND ANAEROBIC Blood Culture adequate volume   Culture   Final    NO GROWTH 3 DAYS Performed at [facility], [address] [place], [place] [address]    Report Status PENDING  Incomplete  Culture, blood (routine x 2)     Status: None (Preliminary result)   Collection Time: 05/13/24  2:43 PM   Specimen: BLOOD  Result Value Ref Range Status   Specimen Description BLOOD BLOOD RIGHT ARM  Final   Special Requests   Final    BOTTLES DRAWN AEROBIC AND ANAEROBIC Blood Culture adequate volume   Culture   Final    NO GROWTH 3 DAYS Performed at [facility], [address] [place], [place] [address]    Report Status PENDING  Incomplete  Urine Culture     Status: None   Collection Time: 05/13/24  3:25 PM   Specimen: Urine, Clean Catch  Result Value Ref Range Status   Specimen Description   Final    URINE, CLEAN CATCH Performed at [facility], [address], [place], [place] [address]    Special Requests   Final    NONE Performed at [facility], [address] [place], [place] [address]    Culture   Final    NO GROWTH Performed at [facility], [address] [place], [place] [address]    Report Status 05/14/2024 FINAL  Final  Resp panel by RT-PCR (RSV, Flu A&B, Covid) Anterior Nasal Swab     Status: None   Collection Time: 05/14/24  9:01 AM   Specimen:  Anterior Nasal Swab  Result Value Ref Range Status   SARS Coronavirus 2 by RT PCR NEGATIVE NEGATIVE Final    Comment: (NOTE) SARS-CoV-2 target nucleic acids are NOT DETECTED.  The SARS-CoV-2 RNA is generally detectable in upper respiratory specimens during the acute phase of infection. The lowest concentration of SARS-CoV-2 viral copies this assay can detect is 138 copies/mL. A negative result  does not preclude SARS-Cov-2 infection and should not be used as the sole basis for treatment or other patient management decisions. A negative result may occur with  improper specimen collection/handling, submission of specimen other than nasopharyngeal swab, presence of viral mutation(s) within the areas targeted by this assay, and inadequate number of viral copies(<138 copies/mL). A negative result must be combined with clinical observations, patient history, and epidemiological information. The expected result is Negative.  Fact Sheet for Patients:  BloggerCourse.com  Fact Sheet for Healthcare Providers:  SeriousBroker.it  This test is no t yet approved or cleared by the [place]  FDA and  has been authorized for detection and/or diagnosis of SARS-CoV-2 by FDA under an Emergency Use Authorization (EUA). This EUA will remain  in effect (meaning this test can be used) for the duration of the COVID-19 declaration under Section 564(b)(1) of the Act, 21 U.S.C.section 360bbb-3(b)(1), unless the authorization is terminated  or revoked sooner.       Influenza A by PCR NEGATIVE NEGATIVE Final   Influenza B by PCR NEGATIVE NEGATIVE Final    Comment: (NOTE) The Xpert Xpress SARS-CoV-2/FLU/RSV plus assay is intended as an aid in the diagnosis of influenza from Nasopharyngeal swab specimens and should not be used as a sole basis for treatment. Nasal washings and aspirates are unacceptable for Xpert Xpress SARS-CoV-2/FLU/RSV testing.  Fact  Sheet for Patients: BloggerCourse.com  Fact Sheet for Healthcare Providers: SeriousBroker.it  This test is not yet approved or cleared by the [place]  FDA and has been authorized for detection and/or diagnosis of SARS-CoV-2 by FDA under an Emergency Use Authorization (EUA). This EUA will remain in effect (meaning this test can be used) for the duration of the COVID-19 declaration under Section 564(b)(1) of the Act, 21 U.S.C. section 360bbb-3(b)(1), unless the authorization is terminated or revoked.     Resp Syncytial Virus by PCR NEGATIVE NEGATIVE Final    Comment: (NOTE) Fact Sheet for Patients: BloggerCourse.com  Fact Sheet for Healthcare Providers: SeriousBroker.it  This test is not yet approved or cleared by the [place]  FDA and has been authorized for detection and/or diagnosis of SARS-CoV-2 by FDA under an Emergency Use Authorization (EUA). This EUA will remain in effect (meaning this test can be used) for the duration of the COVID-19 declaration under Section 564(b)(1) of the Act, 21 U.S.C. section 360bbb-3(b)(1), unless the authorization is terminated or revoked.  Performed at [facility], [address], [place], [place] [address]   Respiratory (~20 pathogens) panel by PCR     Status: None   Collection Time: 05/16/24  5:57 AM   Specimen: Nasopharyngeal Swab; Respiratory  Result Value Ref Range Status   Adenovirus NOT DETECTED NOT DETECTED Final   Coronavirus 229E NOT DETECTED NOT DETECTED Final    Comment: (NOTE) The Coronavirus on the Respiratory Panel, DOES NOT test for the novel  Coronavirus (2019 nCoV)    Coronavirus HKU1 NOT DETECTED NOT DETECTED Final   Coronavirus NL63 NOT DETECTED NOT DETECTED Final   Coronavirus OC43 NOT DETECTED NOT DETECTED Final   Metapneumovirus NOT DETECTED NOT DETECTED Final   Rhinovirus / Enterovirus NOT DETECTED NOT  DETECTED Final   Influenza A NOT DETECTED NOT DETECTED Final   Influenza B NOT DETECTED NOT DETECTED Final   Parainfluenza Virus 1 NOT DETECTED NOT DETECTED Final   Parainfluenza Virus 2 NOT DETECTED NOT DETECTED Final   Parainfluenza Virus 3 NOT DETECTED NOT DETECTED Final   Parainfluenza Virus 4 NOT DETECTED NOT DETECTED Final  Respiratory Syncytial Virus NOT DETECTED NOT DETECTED Final   Bordetella pertussis NOT DETECTED NOT DETECTED Final   Bordetella Parapertussis NOT DETECTED NOT DETECTED Final   Chlamydophila pneumoniae NOT DETECTED NOT DETECTED Final   Mycoplasma pneumoniae NOT DETECTED NOT DETECTED Final    Comment: Performed at [facility], [address] [place], [place] [address]  MRSA Next Gen by PCR, Nasal     Status: None   Collection Time: 05/16/24  5:57 AM   Specimen: Nasal Mucosa; Nasal Swab  Result Value Ref Range Status   MRSA by PCR Next Gen NOT DETECTED NOT DETECTED Final    Comment: (NOTE) The GeneXpert MRSA Assay (FDA approved for NASAL specimens only), is one component of a comprehensive MRSA colonization surveillance program. It is not intended to diagnose MRSA infection nor to guide or monitor treatment for MRSA infections. Test performance is not FDA approved in patients less than 92 years old. Performed at [facility], [address] [place], [place] [address]      Scheduled Meds:  aspirin  EC  81 mg Oral Q breakfast   atorvastatin   40 mg Oral QHS   cilostazol   100 mg Oral BID   clopidogrel   75 mg Oral q AM   feeding supplement  237 mL Oral BID BM   heparin   5,000 Units Subcutaneous Q8H   insulin  aspart  0-5 Units Subcutaneous QHS   insulin  aspart  0-9 Units Subcutaneous TID WC   metoprolol  succinate  25 mg Oral Daily   multivitamin with minerals  1 tablet Oral Daily   polyethylene glycol  17 g Oral BID   Continuous Infusions:  ampicillin-sulbactam (UNASYN) IV 3 g (05/16/24 2304)   lactated ringers      linezolid (ZYVOX) IV 600 mg  (05/16/24 2351)    Procedures/Studies:   Alm Schneider, DO  [facility]  If 7PM-7AM, please contact night-coverage [email or web address] Password [email or web address] 05/17/2024, 8:18 AM   LOS: 4 days

## 2024-05-17 NOTE — Anesthesia Procedure Notes (Signed)
 Procedure Name: Intubation Date/Time: 05/17/2024 10:41 PM  Performed by: Celia Alan HERO, CRNAPre-anesthesia Checklist: Emergency Drugs available and Suction available Oxygen Delivery Method: Ambu bag Laryngoscope Size: Glidescope and 4 Grade View: Grade I Tube type: Oral Tube size: 7.5 mm Number of attempts: 1 Airway Equipment and Method: Stylet and Video-laryngoscopy Placement Confirmation: ETT inserted through vocal cords under direct vision, positive ETCO2, breath sounds checked- equal and bilateral and CO2 detector Secured at: 24 cm Tube secured with: Tape Dental Injury: Teeth and Oropharynx as per pre-operative assessment  Comments: Code blue intubation.  ETT secured with holder by RT.

## 2024-05-17 NOTE — Progress Notes (Signed)
 LTM VIDEO EEG hooked up and running - no initial skin breakdown - push button tested - [facility] is monitoring.

## 2024-05-17 NOTE — Plan of Care (Signed)
 On-call neurology note  Called by Dr. Evonnie, hospitalist, Regarding this patient with a complex medical history, 77 year old man with COPD, hypertension, CKD, metabolic syndrome, presented for altered mental status, which has been deemed to be AKI on CKD.  Also has right facial edema with CT maxillofacial showing concern for parotitis and lymphadenopathy.  Has been evaluated in the past with T-spine and L-spine enhancing lesions which have been worked up with biopsy, negative for malignancy.  Myeloma panel negative. Wife has also reported intermittent shaking of the right side and right chest. Mental status remains poor in spite of supportive care and some improvement in labs. Hospitalist concerned about ongoing seizure versus electrographic abnormalities in the brain causing mental status changes.  Also questions whether there is a need for LP for evaluation of paraneoplastic process that could provide unifying diagnosis I recommend that he be transferred to [facility], and get an in person neurological consultation.  I will make my team aware and they will be available to see him once he arrives at [facility].  Recommendations after consultation by the [facility] team.  Discussed with Dr. Evonnie Eligio Lav, MD Neurology

## 2024-05-17 NOTE — Progress Notes (Signed)
 Assisted with BVm and intubation during code. Airway secured and patient transported to 61M11 with RT and RN being bagged. Placed on vent in 61M

## 2024-05-17 NOTE — Progress Notes (Incomplete)
 LTM maint complete - no skin breakdown under:

## 2024-05-17 NOTE — H&P (Signed)
 NAME:  RECTOR DEVONSHIRE, MRN:  [id number], DOB:  23-Dec-1945, LOS: 4 ADMISSION DATE:  05/13/2024, CONSULTATION DATE:  05/17/2024 REFERRING MD:  Hospitalist service, CHIEF COMPLAINT:  Code Blue   History of Present Illness:  78 y/o male with PMH for CAD status post CABG, DM type 2, HTN, hyperlipidemia, CKD stage IV, COPD, PAD, atrial tachycardia, diverticulitis status post colostomy and subsequent takedown, TIA who presented initially from [facility] with generalized weakness, confusion, difficulty ambulating and decreased oral intake.  He has had several episodes of pneumonia and has been deteriorating since May 2025.  He was treated for CAP in August at which time he also was noted to have acute on chronic renal failure.  CT head negative on admission.  Her also had episodic delirium.   Review of the medical record shows the patient has had enhancing lesions in his lumbar and thoracic spine on MRI (12/07/23).  PET scan (12/27/23) also showed Multifocal mild areas of abnormal uptake along the skeleton including several ribs, vertebral bodies and scattered along the pelvis. Some of these lesions are associated with areas of bony sclerosis.  He had separate T9 and L3 bone biopsies without any metastatic carcinoma identified.  He has seen medical oncology and has undergone extensive workup including serum immunoglobulins and SPEP which have been negative.  In addition, tumor markers CEA, CA 19-9, alpha-fetoprotein, and PSA were all unremarkable. During this hospitalization, wife stated she has noticed some shaking in his right arm and right chest intermittently.   This evening he was on and he c/o back pain, took his O2 off and then went into pulseless V tach and then PEA.  Code blue called and CPR started.  Initially lost his pulse and then regained only to lose pulse again then regained.  He was subsequently transferred to 2M11. Pertinent  Medical History   CAD status post CABG, DM type 2, HTN, hyperlipidemia,  CKD stage IV, COPD, PAD, atrial tachycardia, diverticulitis status post colostomy and subsequent takedown, TIA   Significant Hospital Events: Including procedures, antibiotic start and stop dates in addition to other pertinent events   10/4 transfer to ICU 10/4 Code blue  Interim History / Subjective:  As above  Objective    Blood pressure (!) 170/75, pulse (!) 108, temperature 97.6 F (36.4 C), temperature source Axillary, resp. rate 17, height 5' 7 (1.702 m), weight 62.6 kg, SpO2 94%.        Intake/Output Summary (Last 24 hours) at 05/17/2024 2304 Last data filed at 05/17/2024 2200 Gross per 24 hour  Intake 800 ml  Output 500 ml  Net 300 ml   Filed Weights   05/13/24 1216 05/13/24 1822  Weight: 59 kg 62.6 kg    Examination: General:  intubated on mech vent HENT: small round and small 2mm, no icterus Lungs: course b/l, no wheezes Cardiovascular: reg s1s2 no murmurs Abdomen: soft nt nd bs  sluggish Extremities: no edema Neuro: post code intubated GU: external cath  Resolved problem list   Assessment and Plan  PEA Cardiac Arrest Repeat labs Check electrolytes Cardiac monitoring Acute hypoxic respiratory failure S/p intubation during code Cxr confirmation of ETT Mech vent management SBT/SAT when appropriate VAP prevention Acute pulmonary edema Cxr showing APE Echo ordered Diuresis Monitor electrolytes AKI on CKD stage IV Monitoring I's/O's Monitor serum Cr Metabolic encephalopathy Neurology following Monitoring in ICU, Neurochecks Right Paroditis Broad spectrum antibiotics C6 spinous process fracture Case has been d/w NRSGY -per neurosurgery--continue operative treatment; can use cervical  collar if significant pain  UTI Broad spectrum antibiotics Failure to thrive Getting w/u and supplementation Possible seizures Seen by Dr Merrianne LTM EEG connected    Labs   CBC: Recent Labs  Lab 05/13/24 1240 05/14/24 0411 05/15/24 0414  05/16/24 0414 05/17/24 0517  WBC 16.7* 16.0* 15.5* 16.7* 20.7*  NEUTROABS 13.5*  --   --   --   --   HGB 9.1* 8.6* 8.4* 8.5* 8.4*  HCT 29.2* 28.4* 27.8* 28.4* 27.3*  MCV 79.8* 80.9 81.5 81.6 81.5  PLT 544* 436* 449* 441* 427*    Basic Metabolic Panel: Recent Labs  Lab 05/13/24 1240 05/13/24 1250 05/14/24 0411 05/15/24 0414 05/16/24 0414 05/17/24 0517  NA 132*  --  138 137 139 140  K 4.6  --  4.4 4.3 4.0 3.9  CL 96*  --  103 104 106 105  CO2 16*  --  17* 18* 18* 16*  GLUCOSE 301*  --  104* 77 72 72  BUN 64*  --  59* 49* 41* 36*  CREATININE 5.48*  --  4.26* 3.32* 2.79* 2.59*  CALCIUM  9.4  --  9.1 9.5 9.6 9.5  MG  --  2.2  --  2.3 2.2 2.0   GFR: Estimated Creatinine Clearance: 21.5 mL/min (A) (by C-G formula based on SCr of 2.59 mg/dL (H)). Recent Labs  Lab 05/13/24 1319 05/13/24 1441 05/14/24 0411 05/14/24 0840 05/15/24 0414 05/16/24 0414 05/17/24 0517  PROCALCITON  --   --   --  0.40  --   --   --   WBC  --   --  16.0*  --  15.5* 16.7* 20.7*  LATICACIDVEN 2.6* 1.6  --   --   --   --   --     Liver Function Tests: Recent Labs  Lab 05/13/24 1240 05/17/24 0517  AST 29 23  ALT 10 8  ALKPHOS 138* 122  BILITOT 0.4 0.4  PROT 7.8 7.1  ALBUMIN  3.7 3.4*   No results for input(s): LIPASE, AMYLASE in the last 168 hours. Recent Labs  Lab 05/13/24 1319 05/17/24 0517  AMMONIA <13 <13    ABG    Component Value Date/Time   PHART 7.44 05/17/2024 0821   PCO2ART 31 (L) 05/17/2024 0821   PO2ART 59 (L) 05/17/2024 0821   HCO3 21.1 05/17/2024 0821   TCO2 21 (L) 04/17/2024 0557   ACIDBASEDEF 2.1 (H) 05/17/2024 0821   O2SAT 90.8 05/17/2024 0821     Coagulation Profile: No results for input(s): INR, PROTIME in the last 168 hours.  Cardiac Enzymes: Recent Labs  Lab 05/14/24 0840  CKTOTAL 36*    HbA1C: Hgb A1c MFr Bld  Date/Time Value Ref Range Status  05/13/2024 12:40 PM 9.0 (H) 4.8 - 5.6 % Final    Comment:    (NOTE) Diagnosis of Diabetes The  following HbA1c ranges recommended by the American Diabetes Association (ADA) may be used as an aid in the diagnosis of diabetes mellitus.  Hemoglobin             Suggested A1C NGSP%              Diagnosis  <5.7                   Non Diabetic  5.7-6.4                Pre-Diabetic  >6.4  Diabetic  <7.0                   Glycemic control for                       adults with diabetes.    12/11/2023 04:20 AM 7.6 (H) 4.8 - 5.6 % Final    Comment:    (NOTE) Pre diabetes:          5.7%-6.4%  Diabetes:              >6.4%  Glycemic control for   <7.0% adults with diabetes     CBG: Recent Labs  Lab 05/17/24 0802 05/17/24 1120 05/17/24 1728 05/17/24 2055 05/17/24 2232  GLUCAP 123* 128* 109* 118* 132*    Review of Systems:   Post code unable to perform  Past Medical History:  He,  has a past medical history of Anemia, CAD S/P percutaneous coronary angioplasty (1998), CKD (chronic kidney disease) stage 3, GFR 30-59 ml/min (HCC), Diabetes mellitus without complication (HCC), Diverticulitis (08/16/2017), GERD (gastroesophageal reflux disease), CABG (10/2015), Hypercholesteremia, Hypertension, Neuropathy, Peripheral vascular disease, Pneumonia, and Tachycardia.   Surgical History:   Past Surgical History:  Procedure Laterality Date   ABDOMINAL AORTOGRAM Bilateral 09/19/2016   Procedure: iliac;  Surgeon: Gaile LELON New, MD;  Location: [facility] INVAS[facility] LAB;  Service: Cardiovascular;  Laterality: Bilateral;   ABDOMINAL AORTOGRAM N/A 04/17/2024   Procedure: ABDOMINAL AORTOGRAM;  Surgeon: Gretta Lonni PARAS, MD;  Location: [facility] INVASIVE CV LAB;  Service: Cardiovascular;  Laterality: N/A;   ABDOMINAL AORTOGRAM W/LOWER EXTREMITY N/A 09/22/2021   Procedure: ABDOMINAL AORTOGRAM W/LOWER EXTREMITY;  Surgeon: Gretta Lonni PARAS, MD;  Location: [facility] LAB;  Service: Cardiovascular;  Laterality: N/A;   ABDOMINAL AORTOGRAM W/LOWER EXTREMITY N/A 10/05/2022    Procedure: ABDOMINAL AORTOGRAM W/LOWER EXTREMITY;  Surgeon: Gretta Lonni PARAS, MD;  Location: [facility];  Service: Cardiovascular;  Laterality: N/A;   BIOPSY  05/16/2023   Procedure: BIOPSY;  Surgeon: Shaaron Lamar HERO, MD;  Location: [facility];  Service: Endoscopy;;   CARDIAC CATHETERIZATION  2003   with stent   CARDIAC CATHETERIZATION N/A 10/11/2015   Procedure: Left Heart Cath and Coronary Angiography;  Surgeon: Peter M Swaziland, MD;  Location: [facility] INVASIVE CV LAB;  Service: Cardiovascular;  Laterality: N/A;   COLON RESECTION N/A 12/13/2017   Procedure: EXPLORATORY LAPAROTOMY, SIGMOID COLECTOMY WITH COLOSTOMY;  Surgeon: Belinda Cough, MD;  Location: [facility] OR;  Service: General;  Laterality: N/A;   COLONOSCOPY N/A 09/22/2013   Procedure: COLONOSCOPY;  Surgeon: Lamar HERO Shaaron, MD;  Location: [facility];  Service: Endoscopy;  Laterality: N/A;  9:30 AM   COLONOSCOPY N/A 02/19/2018   Procedure: COLONOSCOPY;  Surgeon: Shaaron Lamar HERO, MD;  Location: [facility];  Service: Endoscopy;  Laterality: N/A;  2:00pm   COLONOSCOPY WITH PROPOFOL  N/A 05/16/2023   Procedure: COLONOSCOPY WITH PROPOFOL ;  Surgeon: Shaaron Lamar HERO, MD;  Location: [facility];  Service: Endoscopy;  Laterality: N/A;  730am, asa 3   COLOSTOMY N/A 12/13/2017   Procedure: COLOSTOMY;  Surgeon: Belinda Cough, MD;  Location[facility];  Service: General;  Laterality: N/A;   COLOSTOMY REVERSAL N/A 03/26/2018   Procedure: COLOSTOMY REVERSAL;  Surgeon: Belinda Cough, MD;  Location: [facility] OR;  Service: General;  Laterality: N/A;   CORONARY ARTERY BYPASS GRAFT N/A 10/18/2015   Procedure: CORONARY ARTERY BYPASS GRAFTING (CABG) x  five, using left internal mammary artery and right leg  greater saphenous vein harvested endoscopically;  Surgeon: Elspeth JAYSON Millers, MD;  Location: [facility] OR;  Service: Open Heart Surgery;  Laterality: N/A;   ENDARTERECTOMY FEMORAL Right 08/09/2016   Procedure: ENDARTERECTOMY FEMORAL WITH VEIN PATCH  ANGIOPLASTY;  Surgeon: Gaile LELON New, MD;  Location: [facility] OR;  Service: Vascular;  Laterality: Right;   ESOPHAGOGASTRODUODENOSCOPY N/A 10/24/2017   Dr. Shaaron: hiatal hernia   ESOPHAGOGASTRODUODENOSCOPY N/A 01/01/2024   Procedure: EGD (ESOPHAGOGASTRODUODENOSCOPY);  Surgeon: Cinderella Deatrice FALCON, MD;  [facility]: [facility];  Service: Endoscopy;  Laterality: N/A;   ESOPHAGOGASTRODUODENOSCOPY (EGD) WITH PROPOFOL  N/A 05/16/2023   Procedure: ESOPHAGOGASTRODUODENOSCOPY (EGD) WITH PROPOFOL ;  Surgeon: Shaaron Lamar HERO, MD;  Location: [facility];  Service: Endoscopy;  Laterality: N/A;   FEMORAL-FEMORAL BYPASS GRAFT Bilateral 08/09/2016   Procedure: REVISION BYPASS GRAFT RIGHT FEMORAL-LEFT FEMORAL ARTERY;  Surgeon: Gaile LELON New, MD;  Location: [facility] OR;  Service: Vascular;  Laterality: Bilateral;   FEMORAL-POPLITEAL BYPASS GRAFT     INTRAMEDULLARY (IM) NAIL INTERTROCHANTERIC Left 04/03/2021   Procedure: LEFT  HIP INTRAMEDULLARY (IM) NAIL INTERTROCHANTRIC;  Surgeon: Vernetta Lonni GRADE, MD;  Location: [facility] OR;  Service: Orthopedics;  Laterality: Left;   IR FLUORO GUIDED NEEDLE PLC ASPIRATION/INJECTION LOC  12/26/2023   IR IVC FILTER RETRIEVAL / S&I /IMG GUID/MOD SED  01/15/2024   LOWER EXTREMITY ANGIOGRAPHY N/A 04/17/2024   Procedure: Lower Extremity Angiography;  Surgeon: Gretta Lonni PARAS, MD;  Location: [facility] INVASIVE CV LAB;  Service: Cardiovascular;  Laterality: N/A;   LOWER EXTREMITY INTERVENTION N/A 04/17/2024   Procedure: LOWER EXTREMITY INTERVENTION;  Surgeon: Gretta Lonni PARAS, MD;  Location: [facility];  Service: Cardiovascular;  Laterality: N/A;   MALONEY DILATION N/A 05/16/2023   Procedure: AGAPITO DILATION;  Surgeon: Shaaron Lamar HERO, MD;  Location: [facility];  Service: Endoscopy;  Laterality: N/A;   PERIPHERAL VASCULAR BALLOON ANGIOPLASTY Right 09/22/2021   Procedure: PERIPHERAL VASCULAR BALLOON ANGIOPLASTY;  Surgeon: Gretta Lonni PARAS, MD;  Location: [facility];   Service: Cardiovascular;  Laterality: Right;   PERIPHERAL VASCULAR CATHETERIZATION N/A 05/08/2016   Procedure: Lower Extremity Angiography;  Surgeon: Dorn PARAS Lesches, MD;  Location: [facility] INVASIVE CV LAB;  Service: Cardiovascular;  Laterality: N/A;   PERIPHERAL VASCULAR INTERVENTION Right 09/19/2016   Procedure: Peripheral Vascular Intervention;  Surgeon: Gaile LELON New, MD;  Location: [facility];  Service: Cardiovascular;  Laterality: Right;  ext iliac stent   PERIPHERAL VASCULAR INTERVENTION Right 09/22/2021   Procedure: PERIPHERAL VASCULAR INTERVENTION;  Surgeon: Gretta Lonni PARAS, MD;  Location: [facility];  Service: Cardiovascular;  Laterality: Right;   POLYPECTOMY  02/19/2018   Procedure: POLYPECTOMY;  Surgeon: Shaaron Lamar HERO, MD;  Location: [facility];  Service: Endoscopy;;   PROCTOSCOPY  10/24/2017   Procedure: PROCTOSCOPY;  Surgeon: Shaaron Lamar HERO, MD;  [facility];  Service: Endoscopy;;   TEE WITHOUT CARDIOVERSION N/A 10/18/2015   Procedure: TRANSESOPHAGEAL ECHOCARDIOGRAM (TEE);  Surgeon: Elspeth JAYSON Millers, MD;  Location: [facility] OR;  Service: Open Heart Surgery;  Laterality: N/A;     Social History:   reports that he quit smoking about 23 years ago. His smoking use included cigarettes. He started smoking about 53 years ago. He has a 60 pack-year smoking history. He has never used smokeless tobacco. He reports that he does not drink alcohol  and does not use drugs.   Family History:  His family history includes Colon cancer in his brother; Heart attack in his mother; Stroke in his mother.  Allergies Allergies  Allergen Reactions   Baclofen Other (See Comments)    Altered mental status, extreme drowsiness   Acarbose Diarrhea     Home Medications  Prior to Admission medications   Medication Sig Start Date End Date Taking? Authorizing Provider  aspirin  EC 81 MG tablet Take 1 tablet (81 mg total) by mouth daily with breakfast. 05/23/19  Yes  Emokpae, Courage, MD  atorvastatin  (LIPITOR ) 40 MG tablet Take 40 mg by mouth at bedtime. 02/07/24  Yes [provider]  cilostazol  (PLETAL ) 100 MG tablet Take 100 mg by mouth 2 (two) times daily. 02/07/24  Yes [provider]  clopidogrel  (PLAVIX ) 75 MG tablet Take 1 tablet (75 mg total) by mouth in the morning. 04/17/24  Yes Gretta Lonni PARAS, MD  insulin  glargine (LANTUS ) 100 UNIT/ML injection Inject 0.1 mLs (10 Units total) into the skin at bedtime. Patient taking differently: Inject 24 Units into the skin at bedtime. 12/11/23  Yes Johnson, Clanford L, MD  lidocaine  (LIDODERM ) 5 % Place 1 patch onto the skin daily. 05/05/24  Yes [provider]  magnesium  oxide (MAG-OX) 400 MG tablet Take 400 mg by mouth 2 (two) times daily. 12/04/23  Yes [provider]  metoprolol  succinate (TOPROL -XL) 50 MG 24 hr tablet Take 0.5 tablets (25 mg total) by mouth daily. 03/23/24  Yes Emokpae, Courage, MD  vitamin B-12 (CYANOCOBALAMIN ) 1000 MCG tablet Take 1,000 mcg by mouth in the morning.   Yes [provider]  amoxicillin -clavulanate (AUGMENTIN ) 875-125 MG tablet Take 1 tablet by mouth 2 (two) times daily. Patient not taking: Reported on 05/13/2024 05/05/24   [provider]  doxycycline  (VIBRA -TABS) 100 MG tablet Take 100 mg by mouth 2 (two) times daily. Patient not taking: Reported on 05/13/2024 05/05/24   [provider]  pantoprazole  (PROTONIX ) 40 MG tablet Take 1 tablet (40 mg total) by mouth daily. 03/23/24   Pearlean Manus, MD  pregabalin  (LYRICA ) 75 MG capsule Take 75 mg by mouth 2 (two) times daily. Patient not taking: Reported on 05/13/2024 01/22/24   [provider]     Critical care time: 51   The patient is critically ill with multiple organ system failure and requires high complexity decision making for assessment and support, frequent evaluation and titration of therapies, advanced monitoring, review of radiographic studies and  interpretation of complex data.   Critical Care Time devoted to patient care services, exclusive of separately billable procedures, described in this note is 35 minutes.   Orlin Fairly, MD [facility] See Amion for pager  If no response to pager , please call [phone] until 7pm After 7:00 pm call Elink  [phone] 05/17/2024, 11:04 PM

## 2024-05-17 NOTE — Progress Notes (Signed)
 1220: This RN to bedside to assess patient. EEG tech at bedside placing electrodes. Family bedside, states has been adequately updated on plan of care. Wife plans to stay this evening. Family asking about speaking to Nephrologist.  Patient areas very drowsy (snoring) but swipes at painful stimuli (and moans), rest of body appears  1652:This RN to patient's bedside as NE calls and said seizure button was pressed, Patine tlaying on R side, patient's daughter states that someone delivered the button and pressed it to test it. NP provider bedside speaking with family at this time 1730: This RN to bedside with NT, Q, to administer medications and conduct a swallow screen for patient. Patient able to consume several ice chips but eventually begins to cough ciolently. Patient sat up in high fowlers and SLP order to consulted. Patient unable to recognize straw and complete motion of sucking. Patient feamily at bedside, Daughter and Wife asked questions regarding Encephalopathy, this RN educated family on the diagnosis. States understands but eager to hopefully find cause.  Patient has eye opening with pain or un comfort. Rambles and makes incomprehensible noises.  Patient still remains on EEG monitoring and is to continue throughout the night.

## 2024-05-17 NOTE — Progress Notes (Signed)
 Patient transferred to [facility], transported by [facility]. IV in right AC and left hand intact. Family at bedside at the time of transfer.

## 2024-05-17 NOTE — Consult Note (Signed)
 NEUROLOGY CONSULT NOTE   Date of service: May 17, 2024 Patient Name: MICKIE KOZIKOWSKI MRN:  [id number] DOB:  10/02/1945 Chief Complaint: Progressively worsening altered mental status and generalized weakness, shaking of hands Requesting Provider: Sherlon Brayton RAMAN, MD  History of Present Illness  YAVIEL KLOSTER is a 78 y.o. male with hx of CAD status post CABG, diabetes, hypertension, hyperlipidemia, CKD stage IV, COPD, peripheral arterial disease, diverticulitis and TIA who presents with generalized weakness, altered mental status, personality changes, decreased oral intake and shaking motions of the hands.  Patient's wife reports that throughout the year, he has had multiple hospital admissions for pneumonia and has sometimes had altered mental status during these admissions but has bounced back afterwards.  Patient's wife reports that current symptoms began after a hospitalization in May of this year.  Patient's wife reports that patient began sleeping more, began falling asleep during the day, developed personality changes to include irritability, developed some confusion and memory problems, had diminished oral intake and developed gradually worsening weakness.  She also reports that he has had violent shaking motions of bilateral upper extremities.  He seems to retain awareness of what is going on during these episodes, and they occur suddenly.  Patient has recently had workup for enhancing lesions seen in the lumbar and thoracic spine which was negative for malignancy.  He does not have any seizure risk factors and has never had a seizure prior to the current shaking activity.  On 9/30, patient went to see his vascular surgeon, and wife discussed his recent decline with the provider at that appointment and was directed to bring him to the emergency department.  He was admitted to [facility] with urinary tract infection and treated appropriately, but decline in mental status continued.  He  was also found to have an AKI has been treated for this, but altered mental status continues.  ROS   Unable to ascertain due to altered mental status  Past History   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   x 5   Hypercholesteremia    Hypertension    Neuropathy    Peripheral vascular disease    s/p R-L FEM-FEM BYPASS   Pneumonia    Tachycardia    after CABG, pt on medicine for this    Past Surgical History:  Procedure Laterality Date   ABDOMINAL AORTOGRAM Bilateral 09/19/2016   Procedure: iliac;  Surgeon: Gaile LELON New, MD;  [facility]: [facility] IN[facility];  Service: Cardiovascular;  Laterality: Bilateral;   ABDOMINAL AORTOGRAM N/A 04/17/2024   Procedure: ABDOMINAL AORTOGRAM;  Surgeon: Gretta Lonni PARAS, MD;  Location: [facility] INVASIVE CV LAB;  Service: Cardiovascular;  Laterality: N/A;   ABDOMINAL AORTOGRAM W/LOWER EXTREMITY N/A 09/22/2021   Procedure: ABDOMINAL AORTOGRAM W/LOWER EXTREMITY;  Surgeon: Gretta Lonni PARAS, MD;  [facility]: [facility];  Service: Cardiovascular;  Laterality: N/A;   ABDOMINAL AORTOGRAM W/LOWER EXTREMITY N/A 10/05/2022   Procedure: ABDOMINAL AORTOGRAM W/LOWER EXTREMITY;  Surgeon: Gretta Lonni PARAS, MD;  [facility]: [facility];  Service: Cardiovascular;  Laterality: N/A;   BIOPSY  05/16/2023   Procedure: BIOPSY;  Surgeon:  Shaaron Lamar HERO, MD;  [facility]: [facility];  Service: Endoscopy;;   CARDIAC CATHETERIZATION  2003   with stent   CARDIAC CATHETERIZATION N/A 10/11/2015   Procedure: Left Heart Cath and Coronary Angiography;  Surgeon: Peter M Swaziland, MD;  Location: [facility] INVASIVE CV LAB;  Service:  Cardiovascular;  Laterality: N/A;   COLON RESECTION N/A 12/13/2017   Procedure: EXPLORATORY LAPAROTOMY, SIGMOID COLECTOMY WITH COLOSTOMY;  Surgeon: Belinda Cough, MD;  Location: [facility] OR;  Service: General;  Laterality: N/A;   COLONOSCOPY N/A 09/22/2013   Procedure: COLONOSCOPY;  Surgeon: Lamar HERO Shaaron, MD;  [facility];  Service: Endoscopy;  Laterality: N/A;  9:30 AM   COLONOSCOPY N/A 02/19/2018   Procedure: COLONOSCOPY;  Surgeon: Shaaron Lamar HERO, MD;  [facility];  Service: Endoscopy;  Laterality: N/A;  2:00pm   COLONOSCOPY WITH PROPOFOL  N/A 05/16/2023   Procedure: COLONOSCOPY WITH PROPOFOL ;  Surgeon: Shaaron Lamar HERO, MD;  [facility];  Service: Endoscopy;  Laterality: N/A;  730am, asa 3   COLOSTOMY N/A 12/13/2017   Procedure: COLOSTOMY;  Surgeon: Belinda Cough, MD;  Location: [facility] OR;  Service: General;  Laterality: N/A;   COLOSTOMY REVERSAL N/A 03/26/2018   Procedure: COLOSTOMY REVERSAL;  Surgeon: Belinda Cough, MD;  [facility];  Service: General;  Laterality: N/A;   CORONARY ARTERY BYPASS GRAFT N/A 10/18/2015   Procedure: CORONARY ARTERY BYPASS GRAFTING (CABG) x  five, using left internal mammary artery and right leg greater saphenous vein harvested endoscopically;  Surgeon: Elspeth JAYSON Millers, MD;  Location: [facility] OR;  Service: Open Heart Surgery;  Laterality: N/A;   ENDARTERECTOMY FEMORAL Right 08/09/2016   Procedure: ENDARTERECTOMY FEMORAL WITH VEIN PATCH ANGIOPLASTY;  Surgeon: Gaile LELON New, MD;  Location: [facility];  Service: Vascular;  Laterality: Right;   ESOPHAGOGASTRODUODENOSCOPY N/A 10/24/2017   Dr. Shaaron: hiatal hernia   ESOPHAGOGASTRODUODENOSCOPY N/A 01/01/2024   Procedure: EGD (ESOPHAGOGASTRODUODENOSCOPY);  Surgeon: Cinderella Deatrice FALCON, MD;  [facility];  Service: Endoscopy;  Laterality: N/A;   ESOPHAGOGASTRODUODENOSCOPY (EGD) WITH PROPOFOL  N/A 05/16/2023   Procedure: ESOPHAGOGASTRODUODENOSCOPY (EGD) WITH PROPOFOL ;  Surgeon:  Shaaron Lamar HERO, MD;  [facility];  Service: Endoscopy;  Laterality: N/A;   FEMORAL-FEMORAL BYPASS GRAFT Bilateral 08/09/2016   Procedure: REVISION BYPASS GRAFT RIGHT FEMORAL-LEFT FEMORAL ARTERY;  Surgeon: Gaile LELON New, MD;  Location: [facility] OR;  Service: Vascular;  Laterality: Bilateral;   FEMORAL-POPLITEAL BYPASS GRAFT     INTRAMEDULLARY (IM) NAIL INTERTROCHANTERIC Left 04/03/2021   Procedure: LEFT  HIP INTRAMEDULLARY (IM) NAIL INTERTROCHANTRIC;  Surgeon: Vernetta Lonni GRADE, MD;  [facility]: [facility];  Service: Orthopedics;  Laterality: Left;   IR FLUORO GUIDED NEEDLE PLC ASPIRATION/INJECTION LOC  12/26/2023   IR IVC FILTER RETRIEVAL / S&I /IMG GUID/MOD SED  01/15/2024   LOWER EXTREMITY ANGIOGRAPHY N/A 04/17/2024   Procedure: Lower Extremity Angiography;  Surgeon: Gretta Lonni PARAS, MD;  Location: [facility] INVASIVE CV LAB;  Service: Cardiovascular;  Laterality: N/A;   LOWER EXTREMITY INTERVENTION N/A 04/17/2024   Procedure: LOWER EXTREMITY INTERVENTION;  Surgeon: Gretta Lonni PARAS, MD;  [facility];  Service: Cardiovascular;  Laterality: N/A;   MALONEY DILATION N/A 05/16/2023   Procedure: AGAPITO DILATION;  Surgeon: Shaaron Lamar HERO, MD;  [facility];  Service: Endoscopy;  Laterality: N/A;   PERIPHERAL VASCULAR BALLOON ANGIOPLASTY Right 09/22/2021   Procedure: PERIPHERAL VASCULAR BALLOON ANGIOPLASTY;  Surgeon: Gretta Lonni PARAS, MD;  [facility];  Service: Cardiovascular;  Laterality: Right;  PERIPHERAL VASCULAR CATHETERIZATION N/A 05/08/2016   Procedure: Lower Extremity Angiography;  Surgeon: Dorn JINNY Lesches, MD;  Location: [facility] INVASIVE CV LAB;  Service: Cardiovascular;  Laterality: N/A;   PERIPHERAL VASCULAR INTERVENTION Right 09/19/2016   Procedure: Peripheral Vascular Intervention;  Surgeon: Gaile LELON New, MD;  [facility];  Service: Cardiovascular;  Laterality: Right;  ext iliac stent   PERIPHERAL VASCULAR  INTERVENTION Right 09/22/2021   Procedure: PERIPHERAL VASCULAR INTERVENTION;  Surgeon: Gretta Lonni JINNY, MD;  [facility];  Service: Cardiovascular;  Laterality: Right;   POLYPECTOMY  02/19/2018   Procedure: POLYPECTOMY;  Surgeon: Shaaron Lamar HERO, MD;  [facility];  Service: Endoscopy;;   PROCTOSCOPY  10/24/2017   Procedure: PROCTOSCOPY;  Surgeon: Shaaron Lamar HERO, MD;  [facility];  Service: Endoscopy;;   TEE WITHOUT CARDIOVERSION N/A 10/18/2015   Procedure: TRANSESOPHAGEAL ECHOCARDIOGRAM (TEE);  Surgeon: Elspeth JAYSON Millers, MD;  Location: [facility] OR;  Service: Open Heart Surgery;  Laterality: N/A;    Family History: Family History  Problem Relation Age of Onset   Heart attack Mother    Stroke Mother    Colon cancer Brother        late 71s    Social History  reports that he quit smoking about 23 years ago. His smoking use included cigarettes. He started smoking about 53 years ago. He has a 60 pack-year smoking history. He has never used smokeless tobacco. He reports that he does not drink alcohol  and does not use drugs.  Allergies  Allergen Reactions   Baclofen Other (See Comments)    Altered mental status, extreme drowsiness   Acarbose Diarrhea    Medications   Current Facility-Administered Medications:    acetaminophen  (TYLENOL ) tablet 650 mg, 650 mg, Oral, Q6H PRN, 650 mg at 05/17/24 0908 **OR** acetaminophen  (TYLENOL ) suppository 650 mg, 650 mg, Rectal, Q6H PRN, Emokpae, Ejiroghene E, MD   Ampicillin-Sulbactam (UNASYN) 3 g in sodium chloride  0.9 % 100 mL IVPB, 3 g, Intravenous, Q12H, Clair Lynwood CROME, RPH, Last Rate: 200 mL/hr at 05/17/24 0951, 3 g at 05/17/24 0951   aspirin  EC tablet 81 mg, 81 mg, Oral, Q breakfast, Emokpae, Ejiroghene E, MD, 81 mg at 05/17/24 9072   atorvastatin  (LIPITOR ) tablet 40 mg, 40 mg, Oral, QHS, Emokpae, Ejiroghene E, MD, 40 mg at 05/16/24 2257   cilostazol  (PLETAL ) tablet 100 mg, 100 mg, Oral, BID, Emokpae,  Ejiroghene E, MD, 100 mg at 05/17/24 0908   clopidogrel  (PLAVIX ) tablet 75 mg, 75 mg, Oral, q AM, Emokpae, Ejiroghene E, MD, 75 mg at 05/17/24 0644   feeding supplement (ENSURE PLUS HIGH PROTEIN) liquid 237 mL, 237 mL, Oral, BID BM, Tat, David, MD, 237 mL at 05/16/24 1251   heparin  injection 5,000 Units, 5,000 Units, Subcutaneous, Q8H, Emokpae, Ejiroghene E, MD, 5,000 Units at 05/17/24 0645   insulin  aspart (novoLOG ) injection 0-5 Units, 0-5 Units, Subcutaneous, QHS, Emokpae, Ejiroghene E, MD, 3 Units at 05/13/24 2242   insulin  aspart (novoLOG ) injection 0-9 Units, 0-9 Units, Subcutaneous, TID WC, Emokpae, Ejiroghene E, MD, 3 Units at 05/15/24 1710   lactated ringers  bolus 1,000 mL, 1,000 mL, Intravenous, Once, Rigney, Christopher D, PA-C   linezolid (ZYVOX) IVPB 600 mg, 600 mg, Intravenous, Q12H, Tat, David, MD, Last Rate: 300 mL/hr at 05/17/24 1028, 600 mg at 05/17/24 1028   metoprolol  succinate (TOPROL -XL) 24 hr tablet 25 mg, 25 mg, Oral, Daily, Emokpae, Ejiroghene E, MD, 25 mg at 05/17/24 0908   multivitamin with  minerals tablet 1 tablet, 1 tablet, Oral, Daily, Tat, David, MD, 1 tablet at 05/16/24 0905   polyethylene glycol (MIRALAX  / GLYCOLAX ) packet 17 g, 17 g, Oral, BID, Emokpae, Ejiroghene E, MD, 17 g at 05/16/24 2257   prochlorperazine (COMPAZINE) injection 10 mg, 10 mg, Intravenous, Q4H PRN, Tat, Alm, MD, 10 mg at 05/17/24 9041   promethazine  (PHENERGAN ) tablet 12.5 mg, 12.5 mg, Oral, Q6H PRN, Emokpae, Ejiroghene E, MD, 12.5 mg at 05/17/24 0908   [START ON 05/20/2024] thiamine (VITAMIN B1) 250 mg in sodium chloride  0.9 % 50 mL IVPB, 250 mg, Intravenous, Daily, de Clint Kill, Cortney E, NP   thiamine (VITAMIN B1) 500 mg in sodium chloride  0.9 % 50 mL IVPB, 500 mg, Intravenous, Q8H, de Clint Kill, Cornish E, NP  Vitals   Vitals:   05/17/24 0555 05/17/24 0736 05/17/24 0737 05/17/24 1358  BP: (!) 161/73  (!) 165/75 (!) 165/78  Pulse: (!) 105  (!) 103 99  Resp: 20  20 19   Temp: 98 F (36.7  C)   98 F (36.7 C)  TempSrc:      SpO2: 92% (!) 86% 90% 96%  Weight:      Height:        Body mass index is 21.62 kg/m.   Physical Exam   Constitutional: Chronically ill-appearing elderly patient in no acute distress Psych: Affect blunted Eyes: No scleral injection.  HENT: No OP obstruction.  Head: Normocephalic.  Respiratory: Effort normal, non-labored breathing on supplemental O2 Skin: WDI.   Neurologic Examination   Mental Status: Patient is drowsy but wakes up to touch and loud voice.  He is unable to answer questions and does not respond to his name saying only maybe Speech/Language: Speech is with moderate dysarthria and in single words Cranial Nerves:  II: PERRL.  III, IV, VI: Does not track examiner around the room VII: Face is symmetrical resting and speaking VIII: hearing intact to voice. IX, X: Voice is dysarthric XII: Noncooperative with tongue protrusion Motor: Moves all 4 extremities, good antigravity strength noted in upper extremities Tone is normal and bulk is normal Sensation- Intact to light touch bilaterally.   Coordination: Unable to perform Gait- Deferred   Labs/Imaging/Neurodiagnostic studies   CBC:  Recent Labs  Lab 05/15/24 1240 05/14/24 0411 05/16/24 0414 05/17/24 0517  WBC 16.7*   < > 16.7* 20.7*  NEUTROABS 13.5*  --   --   --   HGB 9.1*   < > 8.5* 8.4*  HCT 29.2*   < > 28.4* 27.3*  MCV 79.8*   < > 81.6 81.5  PLT 544*   < > 441* 427*   < > = values in this interval not displayed.   Basic Metabolic Panel:  Lab Results  Component Value Date   NA 140 05/17/2024   K 3.9 05/17/2024   CO2 16 (L) 05/17/2024   GLUCOSE 72 05/17/2024   BUN 36 (H) 05/17/2024   CREATININE 2.59 (H) 05/17/2024   CALCIUM  9.5 05/17/2024   GFRNONAA 25 (L) 05/17/2024   GFRAA >60 05/22/2019   Lipid Panel:  Lab Results  Component Value Date   LDLCALC 84 12/29/2020   HgbA1c:  Lab Results  Component Value Date   HGBA1C 9.0 (H) 15-May-2024   Urine  Drug Screen:     Component Value Date/Time   LABOPIA NONE DETECTED 01/26/2024 2300   COCAINSCRNUR NONE DETECTED 01/26/2024 2300   LABBENZ NONE DETECTED 01/26/2024 2300   AMPHETMU NONE DETECTED 01/26/2024 2300  THCU NONE DETECTED 01/26/2024 2300   LABBARB NONE DETECTED 01/26/2024 2300    Alcohol  Level     Component Value Date/Time   ETH <5 04/26/2017 1100   INR  Lab Results  Component Value Date   INR 1.2 01/15/2024   APTT  Lab Results  Component Value Date   APTT 29 01/01/2024   RPR pending Thiamine pending Ammonia less than 13 Folate 12.6 B12 762  MRI Brain (Personally reviewed): Motion degraded, but no acute abnormality  Neurodiagnostics Continuous EEG:  Pending  ASSESSMENT  GARISON GENOVA is a 78 y.o. male with hx of CAD status post CABG, diabetes, hypertension, hyperlipidemia, CKD stage IV, COPD, peripheral arterial disease, diverticulitis and TIA who presents with gradual onset generalized weakness, poor oral intake, confusion, memory problems and personality changes which have been progressing since May.  Patient also has had shaking motions of bilateral hands which seem to come on suddenly and resolve suddenly as well.  Family states that shaking motions can be somewhat violent. There has been a concern for cancer given bone lesions seen on imaging, but these were biopsied and found to be negative.  He was admitted to [facility] on 9/30 with a UTI.   - Exam as documented above is most consistent with an agitated delirium.  - Differential diagnosis: - Patient has had multiple episodes of pneumonia over the past few months and recurrent UTI, so toxic metabolic encephalopathy likely plays a part in his altered mental status.   - Given his hospitalization over the past few days, suspect that hospital induced delirium is also a concern.   - Due to concern for seizure activity, LTM EEG has been connected.  Feel that described upper extremity shaking is more  consistent with tremor, but subclinical seizure should be ruled out in any case.   - Due to poor oral intake, will send thiamine and start Wernicke's dose thiamine supplementation.   - There was some discussion of lumbar puncture to detect paraneoplastic or autoimmune etiology of altered mental status, but workup so far has been negative for malignancy.  May consider this if other studies show no cause for patient's slow decline.  Would need to hold Plavix  and cilostazol  for several days prior to this procedure.   - Would also consider palliative care consult given multiple comorbidities, significant frailty, multiple recent illnesses and progressive decline in function.  RECOMMENDATIONS  - LTM EEG - Hold off on AEDs until LTM EEG resulted - Send thiamine level and RPR - Start Wernicke's dose thiamine supplementation - PT/OT - Consider palliative consult given long course of illness, multiple comorbidities, significant frailty and progressive decline - Treatment of UTI per primary team - Delirium precautions ______________________________________________________________________  Patient seen by NP and then by MD. Signed, Cortney E Everitt Clint Kill, NP [facility]   I have seen and examined the patient. I have formulated the assessment and recommendations. 78 y.o. male with hx of CAD status post CABG, diabetes, hypertension, hyperlipidemia, CKD stage IV, COPD, peripheral arterial disease, diverticulitis and TIA who presents with gradual onset generalized weakness, poor oral intake, confusion, memory problems and personality changes which have been progressing since May.  Patient also has had shaking motions of bilateral hands which seem to come on suddenly and resolve suddenly as well.  Family states that shaking motions can be somewhat violent. There has been a concern for cancer given bone lesions seen on imaging, but these were biopsied and found to be negative.  He was admitted to [facility] on 9/30 with a UTI. Exam as documented above is most consistent with an agitated delirium. Recommendations as above.  Electronically signed: Dr. Jaion Lagrange

## 2024-05-18 ENCOUNTER — Inpatient Hospital Stay (HOSPITAL_COMMUNITY)

## 2024-05-18 ENCOUNTER — Encounter (HOSPITAL_COMMUNITY)

## 2024-05-18 ENCOUNTER — Other Ambulatory Visit: Payer: Self-pay

## 2024-05-18 DIAGNOSIS — J9601 Acute respiratory failure with hypoxia: Secondary | ICD-10-CM

## 2024-05-18 DIAGNOSIS — M79661 Pain in right lower leg: Secondary | ICD-10-CM | POA: Diagnosis not present

## 2024-05-18 DIAGNOSIS — R4182 Altered mental status, unspecified: Secondary | ICD-10-CM | POA: Diagnosis not present

## 2024-05-18 DIAGNOSIS — N179 Acute kidney failure, unspecified: Secondary | ICD-10-CM | POA: Diagnosis not present

## 2024-05-18 DIAGNOSIS — I469 Cardiac arrest, cause unspecified: Secondary | ICD-10-CM | POA: Diagnosis not present

## 2024-05-18 DIAGNOSIS — G9341 Metabolic encephalopathy: Secondary | ICD-10-CM

## 2024-05-18 DIAGNOSIS — R569 Unspecified convulsions: Secondary | ICD-10-CM

## 2024-05-18 DIAGNOSIS — J810 Acute pulmonary edema: Secondary | ICD-10-CM | POA: Diagnosis not present

## 2024-05-18 LAB — COMPREHENSIVE METABOLIC PANEL WITH GFR
ALT: 264 U/L — ABNORMAL HIGH (ref 0–44)
AST: 475 U/L — ABNORMAL HIGH (ref 15–41)
Albumin: 2.5 g/dL — ABNORMAL LOW (ref 3.5–5.0)
Alkaline Phosphatase: 102 U/L (ref 38–126)
Anion gap: 18 — ABNORMAL HIGH (ref 5–15)
BUN: 37 mg/dL — ABNORMAL HIGH (ref 8–23)
CO2: 13 mmol/L — ABNORMAL LOW (ref 22–32)
Calcium: 8.8 mg/dL — ABNORMAL LOW (ref 8.9–10.3)
Chloride: 109 mmol/L (ref 98–111)
Creatinine, Ser: 2.65 mg/dL — ABNORMAL HIGH (ref 0.61–1.24)
GFR, Estimated: 24 mL/min — ABNORMAL LOW (ref >60)
Glucose, Bld: 143 mg/dL — ABNORMAL HIGH (ref 70–99)
Potassium: 4.1 mmol/L (ref 3.5–5.1)
Sodium: 140 mmol/L (ref 135–145)
Total Bilirubin: 1 mg/dL (ref 0.0–1.2)
Total Protein: 6.4 g/dL — ABNORMAL LOW (ref 6.5–8.1)

## 2024-05-18 LAB — POCT I-STAT 7, (LYTES, BLD GAS, ICA,H+H)
Acid-base deficit: 10 mmol/L — ABNORMAL HIGH (ref 0.0–2.0)
Bicarbonate: 15 mmol/L — ABNORMAL LOW (ref 20.0–28.0)
Calcium, Ion: 1.2 mmol/L (ref 1.15–1.40)
HCT: 22 % — ABNORMAL LOW (ref 39.0–52.0)
Hemoglobin: 7.5 g/dL — ABNORMAL LOW (ref 13.0–17.0)
O2 Saturation: 95 %
Patient temperature: 35.3
Potassium: 4.2 mmol/L (ref 3.5–5.1)
Sodium: 140 mmol/L (ref 135–145)
TCO2: 16 mmol/L — ABNORMAL LOW (ref 22–32)
pCO2 arterial: 26.3 mmHg — ABNORMAL LOW (ref 32–48)
pH, Arterial: 7.356 (ref 7.35–7.45)
pO2, Arterial: 72 mmHg — ABNORMAL LOW (ref 83–108)

## 2024-05-18 LAB — ECHOCARDIOGRAM COMPLETE
AR max vel: 1.76 cm2
AV Area VTI: 1.67 cm2
AV Area mean vel: 1.5 cm2
AV Mean grad: 4 mmHg
AV Peak grad: 7.2 mmHg
Ao pk vel: 1.34 m/s
Area-P 1/2: 7.51 cm2
Calc EF: 60.6 %
Height: 67 in
MV VTI: 1.72 cm2
S' Lateral: 3.3 cm
Single Plane A2C EF: 58.5 %
Single Plane A4C EF: 59.7 %
Weight: 2208.13 [oz_av]

## 2024-05-18 LAB — CBC
HCT: 20.4 % — ABNORMAL LOW (ref 39.0–52.0)
HCT: 22.6 % — ABNORMAL LOW (ref 39.0–52.0)
Hemoglobin: 6.2 g/dL — CL (ref 13.0–17.0)
Hemoglobin: 7 g/dL — ABNORMAL LOW (ref 13.0–17.0)
MCH: 25.1 pg — ABNORMAL LOW (ref 26.0–34.0)
MCH: 25.8 pg — ABNORMAL LOW (ref 26.0–34.0)
MCHC: 30.4 g/dL (ref 30.0–36.0)
MCHC: 31 g/dL (ref 30.0–36.0)
MCV: 82.6 fL (ref 80.0–100.0)
MCV: 83.4 fL (ref 80.0–100.0)
Platelets: 333 K/uL (ref 150–400)
Platelets: 275 K/uL (ref 150–400)
RBC: 2.47 MIL/uL — ABNORMAL LOW (ref 4.22–5.81)
RBC: 2.71 MIL/uL — ABNORMAL LOW (ref 4.22–5.81)
RDW: 19.9 % — ABNORMAL HIGH (ref 11.5–15.5)
RDW: 19.8 % — ABNORMAL HIGH (ref 11.5–15.5)
WBC: 15.3 K/uL — ABNORMAL HIGH (ref 4.0–10.5)
WBC: 12.8 K/uL — ABNORMAL HIGH (ref 4.0–10.5)
nRBC: 0 % (ref 0.0–0.2)
nRBC: 0 % (ref 0.0–0.2)

## 2024-05-18 LAB — BRAIN NATRIURETIC PEPTIDE: B Natriuretic Peptide: 1211.6 pg/mL — ABNORMAL HIGH (ref 0.0–100.0)

## 2024-05-18 LAB — CULTURE, BLOOD (ROUTINE X 2)
Culture: NO GROWTH
Culture: NO GROWTH
Special Requests: ADEQUATE
Special Requests: ADEQUATE

## 2024-05-18 LAB — GLUCOSE, CAPILLARY
Glucose-Capillary: 160 mg/dL — ABNORMAL HIGH (ref 70–99)
Glucose-Capillary: 192 mg/dL — ABNORMAL HIGH (ref 70–99)
Glucose-Capillary: 158 mg/dL — ABNORMAL HIGH (ref 70–99)
Glucose-Capillary: 140 mg/dL — ABNORMAL HIGH (ref 70–99)
Glucose-Capillary: 106 mg/dL — ABNORMAL HIGH (ref 70–99)
Glucose-Capillary: 112 mg/dL — ABNORMAL HIGH (ref 70–99)
Glucose-Capillary: 93 mg/dL (ref 70–99)

## 2024-05-18 LAB — FIBRINOGEN: Fibrinogen: 703 mg/dL — ABNORMAL HIGH (ref 210–475)

## 2024-05-18 LAB — TROPONIN I (HIGH SENSITIVITY): Troponin I (High Sensitivity): 117 ng/L (ref <18)

## 2024-05-18 LAB — PREPARE RBC (CROSSMATCH)

## 2024-05-18 LAB — PROTIME-INR
INR: 1.4 — ABNORMAL HIGH (ref 0.8–1.2)
Prothrombin Time: 17.9 s — ABNORMAL HIGH (ref 11.4–15.2)

## 2024-05-18 LAB — TRIGLYCERIDES: Triglycerides: 60 mg/dL (ref <150)

## 2024-05-18 LAB — RPR: RPR Ser Ql: NONREACTIVE

## 2024-05-18 LAB — LACTIC ACID, PLASMA
Lactic Acid, Venous: 2.8 mmol/L (ref 0.5–1.9)
Lactic Acid, Venous: 1.1 mmol/L (ref 0.5–1.9)

## 2024-05-18 LAB — MAGNESIUM: Magnesium: 2 mg/dL (ref 1.7–2.4)

## 2024-05-18 MED ORDER — CILOSTAZOL 100 MG PO TABS
100.0000 mg | ORAL_TABLET | Freq: Two times a day (BID) | ORAL | Status: DC
  Administered 2024-05-18 (×2): 100 mg
  Filled 2024-05-18 (×3): qty 1

## 2024-05-18 MED ORDER — ATORVASTATIN CALCIUM 40 MG PO TABS: 40.0000 mg | ORAL_TABLET | Freq: Every day | ORAL | Status: DC

## 2024-05-18 MED ORDER — CLOPIDOGREL BISULFATE 75 MG PO TABS
75.0000 mg | ORAL_TABLET | Freq: Every morning | ORAL | Status: DC
  Administered 2024-05-18: 75 mg
  Filled 2024-05-18: qty 1

## 2024-05-18 MED ORDER — SODIUM CHLORIDE 0.9 % IV SOLN: INTRAVENOUS | Status: DC

## 2024-05-18 MED ORDER — PROMETHAZINE HCL 12.5 MG PO TABS: 12.5000 mg | ORAL_TABLET | Freq: Four times a day (QID) | ORAL | Status: DC | PRN

## 2024-05-18 MED ORDER — ORAL CARE MOUTH RINSE
15.0000 mL | OROMUCOSAL | Status: DC
  Administered 2024-05-18 – 2024-05-24 (×73): 15 mL via OROMUCOSAL

## 2024-05-18 MED ORDER — ACETAMINOPHEN 325 MG PO TABS: 650.0000 mg | ORAL_TABLET | Freq: Four times a day (QID) | ORAL | Status: DC | PRN

## 2024-05-18 MED ORDER — CLOPIDOGREL BISULFATE 75 MG PO TABS: 75.0000 mg | ORAL_TABLET | Freq: Every morning | ORAL | Status: DC

## 2024-05-18 MED ORDER — SODIUM CHLORIDE 0.9% IV SOLUTION: Freq: Once | INTRAVENOUS | Status: AC

## 2024-05-18 MED ORDER — METOPROLOL TARTRATE 12.5 MG HALF TABLET
12.5000 mg | ORAL_TABLET | Freq: Two times a day (BID) | ORAL | Status: DC
  Administered 2024-05-18 (×2): 12.5 mg
  Filled 2024-05-18 (×2): qty 1

## 2024-05-18 MED ORDER — ADULT MULTIVITAMIN W/MINERALS CH
1.0000 | ORAL_TABLET | Freq: Every day | ORAL | Status: DC
  Administered 2024-05-18: 1
  Filled 2024-05-18: qty 1

## 2024-05-18 MED ORDER — NOREPINEPHRINE 4 MG/250ML-% IV SOLN
0.0000 ug/min | INTRAVENOUS | Status: DC
  Administered 2024-05-18: 2 ug/min via INTRAVENOUS

## 2024-05-18 MED ORDER — ORAL CARE MOUTH RINSE: 15.0000 mL | OROMUCOSAL | Status: DC | PRN

## 2024-05-18 MED ORDER — POLYETHYLENE GLYCOL 3350 17 G PO PACK
17.0000 g | PACK | Freq: Two times a day (BID) | ORAL | Status: DC
  Administered 2024-05-18 (×2): 17 g
  Filled 2024-05-18: qty 1

## 2024-05-18 MED ORDER — ASPIRIN 81 MG PO CHEW
81.0000 mg | CHEWABLE_TABLET | Freq: Every day | ORAL | Status: DC
  Administered 2024-05-18: 81 mg
  Filled 2024-05-18: qty 1

## 2024-05-18 MED ORDER — ACETAMINOPHEN 650 MG RE SUPP: 650.0000 mg | Freq: Four times a day (QID) | RECTAL | Status: DC | PRN

## 2024-05-18 MED ORDER — PANTOPRAZOLE SODIUM 40 MG IV SOLR
40.0000 mg | Freq: Two times a day (BID) | INTRAVENOUS | Status: DC
  Administered 2024-05-18 – 2024-05-23 (×12): 40 mg via INTRAVENOUS
  Filled 2024-05-18 (×11): qty 10

## 2024-05-18 NOTE — Progress Notes (Signed)
  LTM maint complete - no skin breakdown under: Fp1,Fp2 -a little redness seen Serviced Fp1 and EEG's ecg lead  [facility] monitored, Event button test confirmed by [facility].

## 2024-05-18 NOTE — Progress Notes (Signed)
 NAME:  [name], MRN:  [id number], DOB:  September 15, 1945, LOS: 5 ADMISSION DATE:  05/13/2024 CHIEF COMPLAINT:  Arrest.    History of Present Illness:  78 y/o male with PMH for CAD status post CABG, DM type 2, HTN, hyperlipidemia, CKD stage IV, COPD, PAD, atrial tachycardia, diverticulitis status post colostomy and subsequent takedown, TIA who presented initially from [facility] with generalized weakness, confusion, difficulty ambulating and decreased oral intake.  He has had several episodes of pneumonia and has been deteriorating since May 2025.  He was treated for CAP in August at which time he also was noted to have acute on chronic renal failure.  CT head negative on admission.  Her also had episodic delirium.   Review of the medical record shows the patient has had enhancing lesions in his lumbar and thoracic spine on MRI (12/07/23).  PET scan (12/27/23) also showed Multifocal mild areas of abnormal uptake along the skeleton including several ribs, vertebral bodies and scattered along the pelvis. Some of these lesions are associated with areas of bony sclerosis.  He had separate T9 and L3 bone biopsies without any metastatic carcinoma identified.  He has seen medical oncology and has undergone extensive workup including serum immunoglobulins and SPEP which have been negative.  In addition, tumor markers CEA, CA 19-9, alpha-fetoprotein, and PSA were all unremarkable. During this hospitalization, wife stated she has noticed some shaking in his right arm and right chest intermittently.    This evening he was on and he c/o back pain, took his O2 off and then went into pulseless V tach and then PEA.  Code blue called and CPR started.  Initially lost his pulse and then regained only to lose pulse again then regained.  He was subsequently transferred to 2M11.  Pertinent  Medical History   CAD status post CABG, DM type 2, HTN, hyperlipidemia, CKD stage IV, COPD, PAD, atrial tachycardia, diverticulitis status  post colostomy and subsequent takedown, TIA   Interim History / Subjective:  Currently sedated and intubated.  Opening eyes while on sedation.  Seem to recognize his wife's voice.  Significant Hospital Events: 10/4 transfer to ICU 10/4 Code blue  Objective    Blood pressure 127/61, pulse 96, temperature 98.1 F (36.7 C), resp. rate 17, height 5' 7 (1.702 m), weight 62.6 kg, SpO2 100%.    Vent Mode: PRVC FiO2 (%):  [50 %-100 %] 50 % Set Rate:  [17 bmp] 17 bmp Vt Set:  [530 fO-449469 mL] 530 mL PEEP:  [5 cmH20] 5 cmH20 Plateau Pressure:  [19 cmH20] 19 cmH20   Intake/Output Summary (Last 24 hours) at 05/18/2024 0735 Last data filed at 05/18/2024 0600 Gross per 24 hour  Intake 1111.9 ml  Output 775 ml  Net 336.9 ml   Filed Weights   05/13/24 1216 05/13/24 1822  Weight: 59 kg 62.6 kg    Examination: General: Sedated and intubated. Lungs: clear to auscultation bilaterally.  Heart: regular rate rhythm, no murmur appreciated.  Abdomen: non tender, non distended. Normal BS.  Neuro: Sedated.  Opening eyes.  Pupils bilaterally equal.  Moving all 4 extremities.   Assessment and Plan  Presented to the hospital on 9/30 for generalized weakness confusion, decreased oral intake and difficulty ambulating.  Patient was admitted to hospitalist service.  Hospital admission in August 2025 for CAP.  He was being treated on the hospital floor for acute on chronic renal failure, metabolic encephalopathy and parotitis with antibiotics. Overnight on 10/4 had V. tach subsequently  led to cardiac arrest, received 1 of epi, no shock.  During first pulse check was PEA arrest.  V. tach leading to cardiac arrest- ?  V. tach arrest versus PEA arrest: - Never received shock. - EKG postcardiac arrest with ST wave depression in lateral leads. - Troponin minimally elevated.  Will recheck.  BNP 1200. - Lactic acid 2.8> 1.1. - Echo: postcardiac arrest: EF normal with grade 1 diastolic dysfunction. - Since  RV is not dilated will hold off on CT PE for now. - Will get lower extremity venous Dopplers. - Cardiology consulted.  Cardiogenic shock: - Postcardiac arrest. - On low-dose Levophed. - Also contributed by sedation.  Monitor.   Pulmonary edema: Acute hypoxic respiratory failure: - Reportedly was hypoxic prior to cardiac arrest. - Status post Lasix. -Intubated.  LT VV. - Vent bundle. -Hold SBT/SAT today. - Hold further diuresis as below.  Shock liver: - Will trend LFTs. - Will get fibrinogen INR. - Liver ultrasound.  CGE: Suspected UGIB: - IV PPI twice daily. - Hold heparin .  Continue aspirin  and Plavix  and monitor hemoglobin closely with recent femorofemoral bypass. - Trend hemoglobin. - Maintain OG.  Acute metabolic encephalopathy: - Currently sedated.  Neurology following. - MRI motion degraded but negative. - EEG negative. - Plan for LP.  Which is pending.  AKI on CKD: - Baseline 1.9-2.2. -Monitor.  Minimally elevated postcardiac arrest. -Was being given IV fluids before. -Hold diuresis for now.  Failure to thrive: - Nutrition consult.  Possible seizures: -EEG negative for seizures. - Neurology following.  Parotitis: - Was started on Unasyn and Zyvox by hospitalist service. - Continue for now.  Peripheral arterial disease: - Status post femorofemoral bypass - 04/17/2024--angioplasty right common iliac and external iliac arteries--Dr. Gretta - Continue aspirin , Plavix , Pletal    C6 spinous process fracture -per neurosurgery--continue operative treatment; can use cervical collar if significant pain  Hypertension: Hyperlipidemia: -Will resume meds as appropriate.  DM 2: - SSI.  Wife updated at bedside.  Labs   CBC: Recent Labs  Lab 05/13/24 1240 05/14/24 0411 05/15/24 0414 05/16/24 0414 05/17/24 0517 05/17/24 2320 05/18/24 0018  WBC 16.7* 16.0* 15.5* 16.7* 20.7* 20.0*  --   NEUTROABS 13.5*  --   --   --   --   --   --   HGB 9.1* 8.6*  8.4* 8.5* 8.4* 8.0* 7.5*  HCT 29.2* 28.4* 27.8* 28.4* 27.3* 27.1* 22.0*  MCV 79.8* 80.9 81.5 81.6 81.5 82.4  --   PLT 544* 436* 449* 441* 427* 423*  --     Basic Metabolic Panel: Recent Labs  Lab 05/13/24 1250 05/14/24 0411 05/15/24 0414 05/16/24 0414 05/17/24 0517 05/17/24 2320 05/18/24 0018  NA  --  138 137 139 140 140 140  K  --  4.4 4.3 4.0 3.9 4.1 4.2  CL  --  103 104 106 105 109  --   CO2  --  17* 18* 18* 16* 13*  --   GLUCOSE  --  104* 77 72 72 143*  --   BUN  --  59* 49* 41* 36* 37*  --   CREATININE  --  4.26* 3.32* 2.79* 2.59* 2.65*  --   CALCIUM   --  9.1 9.5 9.6 9.5 8.8*  --   MG 2.2  --  2.3 2.2 2.0 2.0  --    GFR: Estimated Creatinine Clearance: 21 mL/min (A) (by C-G formula based on SCr of 2.65 mg/dL (H)). Recent Labs  Lab 05/13/24 1319 05/13/24 1441  05/14/24 0411 05/14/24 0840 05/15/24 0414 05/16/24 0414 05/17/24 0517 05/17/24 2320 05/18/24 0243  PROCALCITON  --   --   --  0.40  --   --   --   --   --   WBC  --   --    < >  --  15.5* 16.7* 20.7* 20.0*  --   LATICACIDVEN 2.6* 1.6  --   --   --   --   --  2.8* 1.1   < > = values in this interval not displayed.    Liver Function Tests: Recent Labs  Lab 05/13/24 1240 05/17/24 0517 05/17/24 2320  AST 29 23 475*  ALT 10 8 264*  ALKPHOS 138* 122 102  BILITOT 0.4 0.4 1.0  PROT 7.8 7.1 6.4*  ALBUMIN  3.7 3.4* 2.5*   No results for input(s): LIPASE, AMYLASE in the last 168 hours. Recent Labs  Lab 05/13/24 1319 05/17/24 0517  AMMONIA <13 <13    ABG    Component Value Date/Time   PHART 7.356 05/18/2024 0018   PCO2ART 26.3 (L) 05/18/2024 0018   PO2ART 72 (L) 05/18/2024 0018   HCO3 15.0 (L) 05/18/2024 0018   TCO2 16 (L) 05/18/2024 0018   ACIDBASEDEF 10.0 (H) 05/18/2024 0018   O2SAT 95 05/18/2024 0018      Past Medical History:  He,  has a past medical history of Anemia, CAD S/P percutaneous coronary angioplasty (1998), CKD (chronic kidney disease) stage 3, GFR 30-59 ml/min (HCC),  Diabetes mellitus without complication (HCC), Diverticulitis (08/16/2017), GERD (gastroesophageal reflux disease), CABG (10/2015), Hypercholesteremia, Hypertension, Neuropathy, Peripheral vascular disease, Pneumonia, and Tachycardia.   Surgical History:   Past Surgical History:  Procedure Laterality Date   ABDOMINAL AORTOGRAM Bilateral 09/19/2016   Procedure: iliac;  Surgeon: Gaile LELON New, MD;  [facility]: [facility] INVAS[facility] CV LAB;  Service: Cardiovascular;  Laterality: Bilateral;   ABDOMINAL AORTOGRAM N/A 04/17/2024   Procedure: ABDOMINAL AORTOGRAM;  Surgeon: Gretta Lonni PARAS, MD;  Location: [facility]VAS[facility] CV LAB;  Service: Cardiovascular;  Laterality: N/A;   ABDOMINAL AORTOGRAM W/LOWER EXTREMITY N/A 09/22/2021   Procedure: ABDOMINAL AORTOGRAM W/LOWER EXTREMITY;  Surgeon: Gretta Lonni PARAS, MD;  Location: [facility] CV [facility];  Service: Cardiovascular;  Laterality: N/A;   ABDOMINAL AORTOGRAM W/LOWER EXTREMITY N/A 10/05/2022   Procedure: ABDOMINAL AORTOGRAM W/LOWER EXTREMITY;  Surgeon: Gretta Lonni PARAS, MD;  Location: [facility];  Service: Cardiovascular;  Laterality: N/A;   BIOPSY  05/16/2023   Procedure: BIOPSY;  Surgeon: Shaaron Lamar HERO, MD;  Location: [facility];  Service: Endoscopy;;   CARDIAC CATHETERIZATION  2003   with stent   CARDIAC CATHETERIZATION N/A 10/11/2015   Procedure: Left Heart Cath and Coronary Angiography;  Surgeon: Peter M Swaziland, MD;  Location: [facility] INVASIVE CV LAB;  Service: Cardiovascular;  Laterality: N/A;   COLON RESECTION N/A 12/13/2017   Procedure: EXPLORATORY LAPAROTOMY, SIGMOID COLECTOMY WITH COLOSTOMY;  Surgeon: Belinda Cough, MD;  Location: [facility] OR;  Service: General;  Laterality: N/A;   COLONOSCOPY N/A 09/22/2013   Procedure: COLONOSCOPY;  Surgeon: Lamar HERO Shaaron, MD;  [facility]: [facility];  Service: Endoscopy;  Laterality: N/A;  9:30 AM   COLONOSCOPY N/A 02/19/2018   Procedure: COLONOSCOPY;  Surgeon: Shaaron Lamar HERO, MD;  [facility]: [facility];   Service: Endoscopy;  Laterality: N/A;  2:00pm   COLONOSCOPY WITH PROPOFOL  N/A 05/16/2023   Procedure: COLONOSCOPY WITH PROPOFOL ;  Surgeon: Shaaron Lamar HERO, MD;  Location: [facility];  Service: Endoscopy;  Laterality: N/A;  730am, asa 3   COLOSTOMY N/A 12/13/2017   Procedure: COLOSTOMY;  Surgeon: Belinda Cough, MD;  Location: [facility] OR;  Service: General;  Laterality: N/A;   COLOSTOMY REVERSAL N/A 03/26/2018   Procedure: COLOSTOMY REVERSAL;  Surgeon: Belinda Cough, MD;  Location: [facility];  Service: General;  Laterality: N/A;   CORONARY ARTERY BYPASS GRAFT N/A 10/18/2015   Procedure: CORONARY ARTERY BYPASS GRAFTING (CABG) x  five, using left internal mammary artery and right leg greater saphenous vein harvested endoscopically;  Surgeon: Elspeth JAYSON Millers, MD;  Location: [facility] OR;  Service: Open Heart Surgery;  Laterality: N/A;   ENDARTERECTOMY FEMORAL Right 08/09/2016   Procedure: ENDARTERECTOMY FEMORAL WITH VEIN PATCH ANGIOPLASTY;  Surgeon: Gaile LELON New, MD;  Location: [facility];  Service: Vascular;  Laterality: Right;   ESOPHAGOGASTRODUODENOSCOPY N/A 10/24/2017   Dr. Shaaron: hiatal hernia   ESOPHAGOGASTRODUODENOSCOPY N/A 01/01/2024   Procedure: EGD (ESOPHAGOGASTRODUODENOSCOPY);  Surgeon: Cinderella Deatrice FALCON, MD;  [facility];  Service: Endoscopy;  Laterality: N/A;   ESOPHAGOGASTRODUODENOSCOPY (EGD) WITH PROPOFOL  N/A 05/16/2023   Procedure: ESOPHAGOGASTRODUODENOSCOPY (EGD) WITH PROPOFOL ;  Surgeon: Shaaron Lamar HERO, MD;  [facility];  Service: Endoscopy;  Laterality: N/A;   FEMORAL-FEMORAL BYPASS GRAFT Bilateral 08/09/2016   Procedure: REVISION BYPASS GRAFT RIGHT FEMORAL-LEFT FEMORAL ARTERY;  Surgeon: Gaile LELON New, MD;  Location: [facility] OR;  Service: Vascular;  Laterality: Bilateral;   FEMORAL-POPLITEAL BYPASS GRAFT     INTRAMEDULLARY (IM) NAIL INTERTROCHANTERIC Left 04/03/2021   Procedure: LEFT  HIP INTRAMEDULLARY (IM) NAIL INTERTROCHANTRIC;  Surgeon: Vernetta Lonni GRADE, MD;   Location: [facility];  Service: Orthopedics;  Laterality: Left;   IR FLUORO GUIDED NEEDLE PLC ASPIRATION/INJECTION LOC  12/26/2023   IR IVC FILTER RETRIEVAL / S&I /IMG GUID/MOD SED  01/15/2024   LOWER EXTREMITY ANGIOGRAPHY N/A 04/17/2024   Procedure: Lower Extremity Angiography;  Surgeon: Gretta Lonni PARAS, MD;  Location: [facility] INVASIVE CV LAB;  Service: Cardiovascular;  Laterality: N/A;   LOWER EXTREMITY INTERVENTION N/A 04/17/2024   Procedure: LOWER EXTREMITY INTERVENTION;  Surgeon: Gretta Lonni PARAS, MD;  [facility];  Service: Cardiovascular;  Laterality: N/A;   MALONEY DILATION N/A 05/16/2023   Procedure: AGAPITO DILATION;  Surgeon: Shaaron Lamar HERO, MD;  [facility];  Service: Endoscopy;  Laterality: N/A;   PERIPHERAL VASCULAR BALLOON ANGIOPLASTY Right 09/22/2021   Procedure: PERIPHERAL VASCULAR BALLOON ANGIOPLASTY;  Surgeon: Gretta Lonni PARAS, MD;  [facility];  Service: Cardiovascular;  Laterality: Right;   PERIPHERAL VASCULAR CATHETERIZATION N/A 05/08/2016   Procedure: Lower Extremity Angiography;  Surgeon: Dorn PARAS Lesches, MD;  Location: [facility] INVASIVE CV LAB;  Service: Cardiovascular;  Laterality: N/A;   PERIPHERAL VASCULAR INTERVENTION Right 09/19/2016   Procedure: Peripheral Vascular Intervention;  Surgeon: Gaile LELON New, MD;  [facility]: [facility];  Service: Cardiovascular;  Laterality: Right;  ext iliac stent   PERIPHERAL VASCULAR INTERVENTION Right 09/22/2021   Procedure: PERIPHERAL VASCULAR INTERVENTION;  Surgeon: Gretta Lonni PARAS, MD;  [facility];  Service: Cardiovascular;  Laterality: Right;   POLYPECTOMY  02/19/2018   Procedure: POLYPECTOMY;  Surgeon: Shaaron Lamar HERO, MD;  [facility];  Service: Endoscopy;;   PROCTOSCOPY  10/24/2017   Procedure: PROCTOSCOPY;  Surgeon: Shaaron Lamar HERO, MD;  [facility];  Service: Endoscopy;;   TEE WITHOUT CARDIOVERSION N/A 10/18/2015   Procedure:  TRANSESOPHAGEAL ECHOCARDIOGRAM (TEE);  Surgeon: Elspeth JAYSON Millers, MD;  Location: [facility] OR;  Service: Open Heart Surgery;  Laterality:  N/A;     Social History:   reports that he quit smoking about 23 years ago. His smoking use included cigarettes. He started smoking about 53 years ago. He has a 60 pack-year smoking history. He has never used smokeless tobacco. He reports that he does not drink alcohol  and does not use drugs.   Family History:  His family history includes Colon cancer in his brother; Heart attack in his mother; Stroke in his mother.   Allergies Allergies  Allergen Reactions   Baclofen Other (See Comments)    Altered mental status, extreme drowsiness   Acarbose Diarrhea     Home Medications  Prior to Admission medications   Medication Sig Start Date End Date Taking? Authorizing Provider  aspirin  EC 81 MG tablet Take 1 tablet (81 mg total) by mouth daily with breakfast. 05/23/19  Yes Emokpae, Courage, MD  atorvastatin  (LIPITOR ) 40 MG tablet Take 40 mg by mouth at bedtime. 02/07/24  Yes [provider]  cilostazol  (PLETAL ) 100 MG tablet Take 100 mg by mouth 2 (two) times daily. 02/07/24  Yes [provider]  clopidogrel  (PLAVIX ) 75 MG tablet Take 1 tablet (75 mg total) by mouth in the morning. 04/17/24  Yes Gretta Lonni PARAS, MD  insulin  glargine (LANTUS ) 100 UNIT/ML injection Inject 0.1 mLs (10 Units total) into the skin at bedtime. Patient taking differently: Inject 24 Units into the skin at bedtime. 12/11/23  Yes Johnson, Clanford L, MD  lidocaine  (LIDODERM ) 5 % Place 1 patch onto the skin daily. 05/05/24  Yes [provider]  magnesium  oxide (MAG-OX) 400 MG tablet Take 400 mg by mouth 2 (two) times daily. 12/04/23  Yes [provider]  metoprolol  succinate (TOPROL -XL) 50 MG 24 hr tablet Take 0.5 tablets (25 mg total) by mouth daily. 03/23/24  Yes Emokpae, Courage, MD  vitamin B-12 (CYANOCOBALAMIN ) 1000 MCG tablet Take 1,000 mcg by mouth in  the morning.   Yes [provider]  amoxicillin -clavulanate (AUGMENTIN ) 875-125 MG tablet Take 1 tablet by mouth 2 (two) times daily. Patient not taking: Reported on 05/13/2024 05/05/24   [provider]  doxycycline  (VIBRA -TABS) 100 MG tablet Take 100 mg by mouth 2 (two) times daily. Patient not taking: Reported on 05/13/2024 05/05/24   [provider]  pantoprazole  (PROTONIX ) 40 MG tablet Take 1 tablet (40 mg total) by mouth daily. 03/23/24   Pearlean Manus, MD  pregabalin  (LYRICA ) 75 MG capsule Take 75 mg by mouth 2 (two) times daily. Patient not taking: Reported on 05/13/2024 01/22/24   [provider]     CRITICAL CARE Performed by: Sammi JONETTA Fredericks.     Total critical care time: 60 minutes   Critical care time was exclusive of separately billable procedures and treating other patients.   Critical care was necessary to treat or prevent imminent or life-threatening deterioration.   Critical care was time spent personally by me on the following activities: development of treatment plan with patient and/or surrogate as well as nursing, discussions with consultants, evaluation of patient's response to treatment, examination of patient, obtaining history from patient or surrogate, ordering and performing treatments and interventions, ordering and review of laboratory studies, ordering and review of radiographic studies, pulse oximetry, re-evaluation of patient's condition and participation in multidisciplinary rounds.  Sammi JONETTA Fredericks, MD Pulmonary, Critical Care and Sleep Attending.  Pager: [phone]  05/18/2024, 7:35 AM

## 2024-05-18 NOTE — Progress Notes (Signed)
 EEG notified by [facility] that the patient has been moved to a different floor.  EEG tech will attempt to locate patient and EEG cart.

## 2024-05-18 NOTE — Plan of Care (Signed)
  Problem: Education: Goal: Knowledge of General Education information will improve Description: Including pain rating scale, medication(s)/side effects and non-pharmacologic comfort measures Outcome: Progressing   Problem: Health Behavior/Discharge Planning: Goal: Ability to manage health-related needs will improve Outcome: Progressing   Problem: Activity: Goal: Risk for activity intolerance will decrease Outcome: Progressing   Problem: Coping: Goal: Level of anxiety will decrease Outcome: Progressing   Problem: Pain Managment: Goal: General experience of comfort will improve and/or be controlled Outcome: Progressing   Problem: Safety: Goal: Ability to remain free from injury will improve Outcome: Progressing   Problem: Skin Integrity: Goal: Risk for impaired skin integrity will decrease Outcome: Progressing

## 2024-05-18 NOTE — Progress Notes (Signed)
   05/18/24 0040  Spiritual Encounters  Type of Visit Initial  Care provided to: Family  Reason for visit Code  OnCall Visit Yes   Chaplain responded to Code Blue and Patient was resuscitated. Pt was moved to 28M ICU and Chaplain provided spiritual care and emotional support to Pt's wife who was extremely upset. After about an hour or so, other family members arrived (son, daughter, brother, and spouses). Upon request, Chaplain prayed with Pt's family. Chaplain is available for further support upon request.  Chaplain Therisa Samuel

## 2024-05-18 NOTE — Procedures (Addendum)
 Patient Name: [name]  MRN: [id number]  Epilepsy Attending: Arlin MALVA Krebs  Referring Physician/Provider: Merrianne Locus, MD  Duration: 05/17/2024 1507 to 05/18/2024 9093  Patient history: 78yo M with ams. EEG to evaluate for seizure  Level of alertness: Awake/ lethargic   AEDs during EEG study: Propofol   Technical aspects: This EEG study was done with scalp electrodes positioned according to the 10-20 International system of electrode placement. Electrical activity was reviewed with band pass filter of 1-70Hz , sensitivity of 7 uV/mm, display speed of 24mm/sec with a 60Hz  notched filter applied as appropriate. EEG data were recorded continuously and digitally stored.  Video monitoring was available and reviewed as appropriate.  Description: EEG initially showed continuous generalized 3 to 6 Hz theta-delta slowing. At around 2228 on 10/4/20205, EEG showed generalized attenuation and patient subsequently went to cardiac arrest. CPR was performed. EEG was disconnected between 05/17/2024 2250 to 05/18/2024 0031 as patient was being transferred to ICU. EEG then showed continuous generalized 3 to 6 Hz theta-delta slowing. Hyperventilation and photic stimulation were not performed.     ABNORMALITY - Continuous slow, generalized  IMPRESSION: This study is suggestive of moderate to severe diffuse encephalopathy, nonspecific etiology. No seizures or epileptiform discharges were seen throughout the recording.  Unnamed Hino O Sharion Grieves

## 2024-05-18 NOTE — Significant Event (Signed)
 Drop in hemoglobin to 6.2.  GI consulted.  Vascular surgery consulted. Will hold Plavix  and aspirin . Status post 1 unit PRBC transfusion.

## 2024-05-18 NOTE — Progress Notes (Signed)
 PT Cancellation and Discharge Note  Patient Details Name: [name] MRN: [id number] DOB: Jun 10, 1946   Cancelled Treatment:    Reason Eval/Treat Not Completed: Patient not medically ready Noted events of previous night, transfer to 50M; Currently maintaining mechanical ventilation;  PT will sign off at this time, due to significant decline;  Will await new PT orders to resume;  Thank you,  Silvano Currier, PT  Acute Rehabilitation Services Office [phone] Secure Chat welcomed    Silvano VEAR Currier 05/18/2024, 8:57 AM

## 2024-05-18 NOTE — Progress Notes (Signed)
 LTM EEG discontinued - no skin breakdown at [facility].

## 2024-05-18 NOTE — Progress Notes (Signed)
 OT Cancellation Note  Patient Details Name: [name] MRN: [id number] DOB: Jul 06, 1946   Cancelled Treatment:    Reason Eval/Treat Not Completed: Patient not medically ready (Patient not medically ready Noted events of previous night, transfer to 26M; Currently maintaining mechanical ventilation; OT will sign off at this time, due to significant decline; Will await new OT orders to resume)  [name], OTD, OTR/L SecureChat Preferred Acute Rehab [phone]   [name] 05/18/2024, 10:22 AM

## 2024-05-18 NOTE — Plan of Care (Signed)
 LTM EEG report for this morning:  Findings of continuous generalized slowing are suggestive of a moderate to severe diffuse encephalopathy, nonspecific regarding etiology. No seizures or epileptiform discharges were seen throughout the recording.  Discontinuing LTM EEG.   A/R: 78 year old male with multiple comorbidities. Neurology consulted for AMS. Findings on exam are most consistent with an agitated delirium.  - LTM EEG with no evidence for seizures.  - Discontinuing LTM EEG - Continue thiamine supplementation - Treatment of UTI per primary team - Avoid deliriogenic medications - OOB to chair daily. Lights on and shades open during the day, lights and TV off at night.  - Bowel regimen - Hydrate well - Continue to monitor for possible improvement  - Neurology will sign off. Please call if there are additional questions.   Electronically signed: Dr. Druscilla Petsch

## 2024-05-18 NOTE — Consult Note (Signed)
 Reason for Consult: GI bleed Referring Physician: CCM  [name] HPI: The is a 78 year old male with a PMH of HTN, hyperlipidemia, CKD, COPD, diverticulitis s/p colostomy with an eventual takedown originally admitted with complaints of weakness and he was noted to have confusion and problems with ambulation.  He was being worked up by Neurology for his AMS as well as findings of bony sclerotic lesions.  On 05/17/2024 he developed PEA and he was successfully resuscitated with CP and he was intubated.  Today he was noted to have an acute drop in his HGB from 7.5 g/dL down to 6.2 g/dL.  He was transfused up to 7.0 g/dL.  The OG tube was positive for blood in the canister.  He had an EGD on 01/01/2024 for coffee-ground emesis at [facility].  The EGD was positive for an LA Grade C esophagitis.  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   x 5   Hypercholesteremia    Hypertension    Neuropathy    Peripheral vascular disease    s/p R-L FEM-FEM BYPASS   Pneumonia    Tachycardia    after CABG, pt on medicine for this    Past Surgical History:  Procedure Laterality Date   ABDOMINAL AORTOGRAM Bilateral 09/19/2016   Procedure: iliac;  Surgeon: Gaile LELON New, MD;  [facility];  Service: Cardiovascular;  Laterality: Bilateral;   ABDOMINAL AORTOGRAM N/A 04/17/2024   Procedure: ABDOMINAL AORTOGRAM;  Surgeon: Gretta Lonni PARAS, MD;  Location: [facility] INVASIVE CV LAB;  Service: Cardiovascular;  Laterality: N/A;   ABDOMINAL AORTOGRAM W/LOWER EXTREMITY N/A 09/22/2021   Procedure: ABDOMINAL AORTOGRAM W/LOWER EXTREMITY;  Surgeon: Gretta Lonni PARAS, MD;  [facility];  Service: Cardiovascular;  Laterality: N/A;   ABDOMINAL AORTOGRAM W/LOWER EXTREMITY N/A 10/05/2022   Procedure: ABDOMINAL AORTOGRAM W/LOWER EXTREMITY;  Surgeon: Gretta Lonni PARAS, MD;  [facility]: [facility];  Service: Cardiovascular;  Laterality: N/A;   BIOPSY  05/16/2023   Procedure: BIOPSY;  Surgeon: Shaaron Lamar HERO, MD;  [facility]: [facility];  Service: Endoscopy;;   CARDIAC CATHETERIZATION  2003   with stent   CARDIAC CATHETERIZATION N/A 10/11/2015   Procedure: Left Heart Cath and Coronary Angiography;  Surgeon: Peter M Swaziland, MD;  Location: [facility] INVASIVE CV LAB;  Service: Cardiovascular;  Laterality: N/A;   COLON RESECTION N/A 12/13/2017   Procedure: EXPLORATORY LAPAROTOMY, SIGMOID COLECTOMY WITH COLOSTOMY;  Surgeon: Belinda Cough, MD;  Location: [facility] OR;  Service: General;  Laterality: N/A;   COLONOSCOPY N/A 09/22/2013   Procedure: COLONOSCOPY;  Surgeon: Lamar HERO Shaaron, MD;  [facility]: [facility];  Service: Endoscopy;  Laterality: N/A;  9:30 AM   COLONOSCOPY N/A 02/19/2018   Procedure: COLONOSCOPY;  Surgeon: Shaaron Lamar HERO, MD;  [facility];  Service: Endoscopy;  Laterality: N/A;  2:00pm   COLONOSCOPY WITH PROPOFOL  N/A 05/16/2023   Procedure: COLONOSCOPY WITH PROPOFOL ;  Surgeon: Shaaron Lamar HERO, MD;  [facility];  Service: Endoscopy;  Laterality: N/A;  730am, asa 3   COLOSTOMY N/A 12/13/2017   Procedure: COLOSTOMY;  Surgeon: Belinda Cough,  MD;  [facility]: [facility] OR;  Service: General;  Laterality: N/A;   COLOSTOMY REVERSAL N/A 03/26/2018   Procedure: COLOSTOMY REVERSAL;  Surgeon: Belinda Cough, MD;  [facility];  Service: General;  Laterality: N/A;   CORONARY ARTERY BYPASS GRAFT N/A 10/18/2015   Procedure: CORONARY ARTERY BYPASS GRAFTING (CABG) x  five, using left internal mammary artery and right leg greater saphenous vein harvested endoscopically;  Surgeon: Elspeth JAYSON Millers, MD;  Location: [facility];  Service: Open Heart Surgery;   Laterality: N/A;   ENDARTERECTOMY FEMORAL Right 08/09/2016   Procedure: ENDARTERECTOMY FEMORAL WITH VEIN PATCH ANGIOPLASTY;  Surgeon: Gaile LELON New, MD;  [facility];  Service: Vascular;  Laterality: Right;   ESOPHAGOGASTRODUODENOSCOPY N/A 10/24/2017   Dr. Shaaron: hiatal hernia   ESOPHAGOGASTRODUODENOSCOPY N/A 01/01/2024   Procedure: EGD (ESOPHAGOGASTRODUODENOSCOPY);  Surgeon: Cinderella Deatrice FALCON, MD;  [facility];  Service: Endoscopy;  Laterality: N/A;   ESOPHAGOGASTRODUODENOSCOPY (EGD) WITH PROPOFOL  N/A 05/16/2023   Procedure: ESOPHAGOGASTRODUODENOSCOPY (EGD) WITH PROPOFOL ;  Surgeon: Shaaron Lamar HERO, MD;  [facility];  Service: Endoscopy;  Laterality: N/A;   FEMORAL-FEMORAL BYPASS GRAFT Bilateral 08/09/2016   Procedure: REVISION BYPASS GRAFT RIGHT FEMORAL-LEFT FEMORAL ARTERY;  Surgeon: Gaile LELON New, MD;  Location: [facility] OR;  Service: Vascular;  Laterality: Bilateral;   FEMORAL-POPLITEAL BYPASS GRAFT     INTRAMEDULLARY (IM) NAIL INTERTROCHANTERIC Left 04/03/2021   Procedure: LEFT  HIP INTRAMEDULLARY (IM) NAIL INTERTROCHANTRIC;  Surgeon: Vernetta Lonni GRADE, MD;  [facility];  Service: Orthopedics;  Laterality: Left;   IR FLUORO GUIDED NEEDLE PLC ASPIRATION/INJECTION LOC  12/26/2023   IR IVC FILTER RETRIEVAL / S&I /IMG GUID/MOD SED  01/15/2024   LOWER EXTREMITY ANGIOGRAPHY N/A 04/17/2024   Procedure: Lower Extremity Angiography;  Surgeon: Gretta Lonni PARAS, MD;  Location: [facility] INVASIVE CV LAB;  Service: Cardiovascular;  Laterality: N/A;   LOWER EXTREMITY INTERVENTION N/A 04/17/2024   Procedure: LOWER EXTREMITY INTERVENTION;  Surgeon: Gretta Lonni PARAS, MD;  [facility];  Service: Cardiovascular;  Laterality: N/A;   MALONEY DILATION N/A 05/16/2023   Procedure: AGAPITO DILATION;  Surgeon: Shaaron Lamar HERO, MD;  [facility];  Service: Endoscopy;  Laterality: N/A;   PERIPHERAL VASCULAR BALLOON ANGIOPLASTY Right 09/22/2021   Procedure:  PERIPHERAL VASCULAR BALLOON ANGIOPLASTY;  Surgeon: Gretta Lonni PARAS, MD;  [facility];  Service: Cardiovascular;  Laterality: Right;   PERIPHERAL VASCULAR CATHETERIZATION N/A 05/08/2016   Procedure: Lower Extremity Angiography;  Surgeon: Dorn PARAS Lesches, MD;  Location: [facility] INVASIVE CV LAB;  Service: Cardiovascular;  Laterality: N/A;   PERIPHERAL VASCULAR INTERVENTION Right 09/19/2016   Procedure: Peripheral Vascular Intervention;  Surgeon: Gaile LELON New, MD;  [facility];  Service: Cardiovascular;  Laterality: Right;  ext iliac stent   PERIPHERAL VASCULAR INTERVENTION Right 09/22/2021   Procedure: PERIPHERAL VASCULAR INTERVENTION;  Surgeon: Gretta Lonni PARAS, MD;  [facility];  Service: Cardiovascular;  Laterality: Right;   POLYPECTOMY  02/19/2018   Procedure: POLYPECTOMY;  Surgeon: Shaaron Lamar HERO, MD;  [facility];  Service: Endoscopy;;   PROCTOSCOPY  10/24/2017   Procedure: PROCTOSCOPY;  Surgeon: Shaaron Lamar HERO, MD;  [facility];  Service: Endoscopy;;   TEE WITHOUT CARDIOVERSION N/A 10/18/2015   Procedure: TRANSESOPHAGEAL ECHOCARDIOGRAM (TEE);  Surgeon: Elspeth JAYSON Millers, MD;  Location: [facility] OR;  Service: Open Heart Surgery;  Laterality: N/A;    Family History  Problem Relation Age of Onset   Heart attack  Mother    Stroke Mother    Colon cancer Brother        late 35s    Social History:  reports that he quit smoking about 23 years ago. His smoking use included cigarettes. He started smoking about 53 years ago. He has a 60 pack-year smoking history. He has never used smokeless tobacco. He reports that he does not drink alcohol  and does not use drugs.  Allergies:  Allergies  Allergen Reactions   Baclofen Other (See Comments)    Altered mental status, extreme drowsiness   Acarbose Diarrhea    Medications: Scheduled:  Chlorhexidine  Gluconate Cloth  6 each Topical Daily   cilostazol   100 mg Per Tube  BID   docusate  100 mg Per Tube BID   feeding supplement  237 mL Oral BID BM   fentaNYL  (SUBLIMAZE ) injection  25-50 mcg Intravenous Once   insulin  aspart  0-5 Units Subcutaneous QHS   insulin  aspart  0-9 Units Subcutaneous TID WC   metoprolol  tartrate  12.5 mg Per Tube BID   multivitamin with minerals  1 tablet Per Tube Daily   mouth rinse  15 mL Mouth Rinse Q2H   pantoprazole  (PROTONIX ) IV  40 mg Intravenous Q12H   polyethylene glycol  17 g Per Tube BID   Continuous:  ampicillin-sulbactam (UNASYN) IV Stopped (05/18/24 1127)   fentaNYL  infusion INTRAVENOUS 75 mcg/hr (05/18/24 1900)   lactated ringers      linezolid (ZYVOX) IV 300 mL/hr at 05/18/24 1500   norepinephrine (LEVOPHED) Adult infusion 1 mcg/min (05/18/24 1700)   propofol  (DIPRIVAN ) infusion 35 mcg/kg/min (05/18/24 1900)   [START ON 05/20/2024] thiamine (VITAMIN B1) injection     thiamine (VITAMIN B1) injection Stopped (05/18/24 1333)    Results for orders placed or performed during the hospital encounter of 05/13/24 (from the past 24 hours)  Glucose, capillary     Status: Abnormal   Collection Time: 05/17/24  5:28 PM  Result Value Ref Range   Glucose-Capillary 109 (H) 70 - 99 mg/dL  RPR     Status: None   Collection Time: 05/17/24  6:30 PM  Result Value Ref Range   RPR Ser Ql NON REACTIVE NON REACTIVE  Glucose, capillary     Status: Abnormal   Collection Time: 05/17/24  8:55 PM  Result Value Ref Range   Glucose-Capillary 118 (H) 70 - 99 mg/dL   Comment 1 Notify RN   Glucose, capillary     Status: Abnormal   Collection Time: 05/17/24 10:32 PM  Result Value Ref Range   Glucose-Capillary 132 (H) 70 - 99 mg/dL  Glucose, capillary     Status: Abnormal   Collection Time: 05/17/24 11:05 PM  Result Value Ref Range   Glucose-Capillary 126 (H) 70 - 99 mg/dL  Brain natriuretic peptide     Status: Abnormal   Collection Time: 05/17/24 11:07 PM  Result Value Ref Range   B Natriuretic Peptide 1,211.6 (H) 0.0 - 100.0 pg/mL   CBC     Status: Abnormal   Collection Time: 05/17/24 11:20 PM  Result Value Ref Range   WBC 20.0 (H) 4.0 - 10.5 K/uL   RBC 3.29 (L) 4.22 - 5.81 MIL/uL   Hemoglobin 8.0 (L) 13.0 - 17.0 g/dL   HCT 72.8 (L) 60.9 - 47.9 %   MCV 82.4 80.0 - 100.0 fL   MCH 24.3 (L) 26.0 - 34.0 pg   MCHC 29.5 (L) 30.0 - 36.0 g/dL   RDW 80.1 (H) 88.4 - 84.4 %  Platelets 423 (H) 150 - 400 K/uL   nRBC 0.0 0.0 - 0.2 %  Comprehensive metabolic panel     Status: Abnormal   Collection Time: 05/17/24 11:20 PM  Result Value Ref Range   Sodium 140 135 - 145 mmol/L   Potassium 4.1 3.5 - 5.1 mmol/L   Chloride 109 98 - 111 mmol/L   CO2 13 (L) 22 - 32 mmol/L   Glucose, Bld 143 (H) 70 - 99 mg/dL   BUN 37 (H) 8 - 23 mg/dL   Creatinine, Ser 7.34 (H) 0.61 - 1.24 mg/dL   Calcium  8.8 (L) 8.9 - 10.3 mg/dL   Total Protein 6.4 (L) 6.5 - 8.1 g/dL   Albumin  2.5 (L) 3.5 - 5.0 g/dL   AST 524 (H) 15 - 41 U/L   ALT 264 (H) 0 - 44 U/L   Alkaline Phosphatase 102 38 - 126 U/L   Total Bilirubin 1.0 0.0 - 1.2 mg/dL   GFR, Estimated 24 (L) >60 mL/min   Anion gap 18 (H) 5 - 15  Lactic acid, plasma     Status: Abnormal   Collection Time: 05/17/24 11:20 PM  Result Value Ref Range   Lactic Acid, Venous 2.8 (HH) 0.5 - 1.9 mmol/L  Magnesium      Status: None   Collection Time: 05/17/24 11:20 PM  Result Value Ref Range   Magnesium  2.0 1.7 - 2.4 mg/dL  Troponin I (High Sensitivity)     Status: Abnormal   Collection Time: 05/17/24 11:21 PM  Result Value Ref Range   Troponin I (High Sensitivity) 117 (HH) <18 ng/L  I-STAT 7, (LYTES, BLD GAS, ICA, H+H)     Status: Abnormal   Collection Time: 05/18/24 12:18 AM  Result Value Ref Range   pH, Arterial 7.356 7.35 - 7.45   pCO2 arterial 26.3 (L) 32 - 48 mmHg   pO2, Arterial 72 (L) 83 - 108 mmHg   Bicarbonate 15.0 (L) 20.0 - 28.0 mmol/L   TCO2 16 (L) 22 - 32 mmol/L   O2 Saturation 95 %   Acid-base deficit 10.0 (H) 0.0 - 2.0 mmol/L   Sodium 140 135 - 145 mmol/L   Potassium 4.2 3.5 - 5.1  mmol/L   Calcium , Ion 1.20 1.15 - 1.40 mmol/L   HCT 22.0 (L) 39.0 - 52.0 %   Hemoglobin 7.5 (L) 13.0 - 17.0 g/dL   Patient temperature 64.6 C    Collection site Femoral    Drawn by HIDE    Sample type ARTERIAL   Triglycerides     Status: None   Collection Time: 05/18/24  2:43 AM  Result Value Ref Range   Triglycerides 60 <150 mg/dL  Lactic acid, plasma     Status: None   Collection Time: 05/18/24  2:43 AM  Result Value Ref Range   Lactic Acid, Venous 1.1 0.5 - 1.9 mmol/L  Glucose, capillary     Status: Abnormal   Collection Time: 05/18/24  3:13 AM  Result Value Ref Range   Glucose-Capillary 160 (H) 70 - 99 mg/dL  Glucose, capillary     Status: Abnormal   Collection Time: 05/18/24  7:43 AM  Result Value Ref Range   Glucose-Capillary 192 (H) 70 - 99 mg/dL  Glucose, capillary     Status: Abnormal   Collection Time: 05/18/24 11:43 AM  Result Value Ref Range   Glucose-Capillary 158 (H) 70 - 99 mg/dL  Type and screen [facility]     Status: None (Preliminary result)  Collection Time: 05/18/24 12:50 PM  Result Value Ref Range   ABO/RH(D) A POS    Antibody Screen NEG    Sample Expiration 05/21/2024,2359    Unit Number [id number]    Blood Component Type RED CELLS,LR    Unit division 00    Status of Unit ISSUED    Transfusion Status OK TO TRANSFUSE    Crossmatch Result      Compatible Performed at [facility], [address], [place], [place] [address]   Fibrinogen     Status: Abnormal   Collection Time: 05/18/24 12:53 PM  Result Value Ref Range   Fibrinogen 703 (H) 210 - 475 mg/dL  Protime-INR     Status: Abnormal   Collection Time: 05/18/24 12:53 PM  Result Value Ref Range   Prothrombin Time 17.9 (H) 11.4 - 15.2 seconds   INR 1.4 (H) 0.8 - 1.2  CBC     Status: Abnormal   Collection Time: 05/18/24 12:53 PM  Result Value Ref Range   WBC 15.3 (H) 4.0 - 10.5 K/uL   RBC 2.47 (L) 4.22 - 5.81 MIL/uL   Hemoglobin 6.2 (LL) 13.0 - 17.0 g/dL   HCT  79.5 (L) 60.9 - 52.0 %   MCV 82.6 80.0 - 100.0 fL   MCH 25.1 (L) 26.0 - 34.0 pg   MCHC 30.4 30.0 - 36.0 g/dL   RDW 80.0 (H) 88.4 - 84.4 %   Platelets 333 150 - 400 K/uL   nRBC 0.0 0.0 - 0.2 %  Prepare RBC (crossmatch)     Status: None   Collection Time: 05/18/24  2:32 PM  Result Value Ref Range   Order Confirmation      ORDER PROCESSED BY BLOOD BANK Performed at [facility], [address] [place], [place] [address]   Glucose, capillary     Status: Abnormal   Collection Time: 05/18/24  3:28 PM  Result Value Ref Range   Glucose-Capillary 140 (H) 70 - 99 mg/dL     US  Abdomen Limited RUQ (LIVER/GB) Result Date: 05/18/2024 CLINICAL DATA:  Transaminitis. EXAM: ULTRASOUND ABDOMEN LIMITED RIGHT UPPER QUADRANT COMPARISON:  None Available. FINDINGS: Gallbladder: Dependent gallstones identified with intraluminal sludge associated. Gallbladder wall thickness upper normal at 2.7 mm. Small volume pericholecystic fluid noted. No sonographic Murphy sign. Common bile duct: Diameter: 3-4 mm Liver: Mild coarsening of hepatic echotexture raises the question of some degree of fatty deposition. No focal abnormality evident. Portal vein is patent on color Doppler imaging with normal direction of blood flow towards the liver. Other: Small right pleural effusion. IMPRESSION: 1. Cholelithiasis with gallbladder sludge and small volume pericholecystic fluid. No sonographic Murphy sign. Correlation for signs/symptoms of acute cholecystitis recommended. Nuclear scintigraphy may prove helpful to further evaluate as clinically warranted. 2. No biliary dilatation. 3. Mild coarsening of hepatic echotexture raises the question of some degree of fatty deposition. Electronically Signed   By: Camellia Candle M.D.   On: 05/18/2024 13:02   ECHOCARDIOGRAM COMPLETE Result Date: 05/18/2024    ECHOCARDIOGRAM REPORT   Patient Name:   BANDON SHERWIN Date of Exam: 05/18/2024 Medical Rec #:  [id number]    Height:       67.0 in Accession  #:    [id number]   Weight:       138.0 lb Date of Birth:  09/10/1945    BSA:          1.727 m Patient Age:    62 years  BP:           143/61 mmHg Patient Gender: M            HR:           93 bpm. Exam Location:  Inpatient Procedure: 2D Echo, Cardiac Doppler and Color Doppler (Both Spectral and Color            Flow Doppler were utilized during procedure). Indications:    Cardiac arrest I46.9  History:        Patient has prior history of Echocardiogram examinations, most                 recent 03/27/2018. TIA and COPD, Signs/Symptoms:Fatigue; Risk                 Factors:Hypertension. H/O Atrial tachycardia, PAD, CKD,                 Hyperlipidemia.  Sonographer:    BERNARDA ROCKS Referring Phys: [id number] LAURA R GLEASON IMPRESSIONS  1. Left ventricular ejection fraction, by estimation, is 60 to 65%. The left ventricle has normal function. Left ventricular endocardial border not optimally defined to evaluate regional wall motion. Left ventricular diastolic parameters are consistent with Grade I diastolic dysfunction (impaired relaxation).  2. Right ventricular systolic function is normal. The right ventricular size is normal. Tricuspid regurgitation signal is inadequate for assessing PA pressure.  3. The mitral valve is grossly normal. No evidence of mitral valve regurgitation. No evidence of mitral stenosis.  4. The aortic valve was not well visualized. Aortic valve regurgitation is not visualized. No aortic stenosis is present.  5. The inferior vena cava is dilated in size with >50% respiratory variability, suggesting right atrial pressure of 8 mmHg. Comparison(s): No prior Echocardiogram. FINDINGS  Left Ventricle: Left ventricular ejection fraction, by estimation, is 60 to 65%. The left ventricle has normal function. Left ventricular endocardial border not optimally defined to evaluate regional wall motion. Strain was performed and the global longitudinal strain is indeterminate. The left ventricular internal  cavity size was normal in size. There is no left ventricular hypertrophy. Left ventricular diastolic parameters are consistent with Grade I diastolic dysfunction (impaired relaxation). Normal left ventricular filling pressure. Right Ventricle: The right ventricular size is normal. No increase in right ventricular wall thickness. Right ventricular systolic function is normal. Tricuspid regurgitation signal is inadequate for assessing PA pressure. Left Atrium: Left atrial size was normal in size. Right Atrium: Right atrial size was normal in size. Pericardium: There is no evidence of pericardial effusion. Mitral Valve: The mitral valve is grossly normal. No evidence of mitral valve regurgitation. No evidence of mitral valve stenosis. MV peak gradient, 4.8 mmHg. The mean mitral valve gradient is 3.0 mmHg. Tricuspid Valve: The tricuspid valve is grossly normal. Tricuspid valve regurgitation is not demonstrated. No evidence of tricuspid stenosis. Aortic Valve: The aortic valve was not well visualized. Aortic valve regurgitation is not visualized. No aortic stenosis is present. Aortic valve mean gradient measures 4.0 mmHg. Aortic valve peak gradient measures 7.2 mmHg. Aortic valve area, by VTI measures 1.67 cm. Pulmonic Valve: The pulmonic valve was not well visualized. Pulmonic valve regurgitation is not visualized. No evidence of pulmonic stenosis. Aorta: The aortic root and ascending aorta are structurally normal, with no evidence of dilitation. Venous: The inferior vena cava is dilated in size with greater than 50% respiratory variability, suggesting right atrial pressure of 8 mmHg. IAS/Shunts: No atrial level shunt detected by color flow Doppler. Additional Comments: 3D  was performed not requiring image post processing on an independent workstation and was indeterminate.  LEFT VENTRICLE PLAX 2D LVIDd:         4.80 cm      Diastology LVIDs:         3.30 cm      LV e' medial:    6.15 cm/s LV PW:         0.80 cm       LV E/e' medial:  16.2 LV IVS:        0.90 cm      LV e' lateral:   10.30 cm/s LVOT diam:     1.70 cm      LV E/e' lateral: 9.7 LV SV:         40 LV SV Index:   23 LVOT Area:     2.27 cm  LV Volumes (MOD) LV vol d, MOD A2C: 81.6 ml LV vol d, MOD A4C: 144.0 ml LV vol s, MOD A2C: 33.9 ml LV vol s, MOD A4C: 58.0 ml LV SV MOD A2C:     47.7 ml LV SV MOD A4C:     144.0 ml LV SV MOD BP:      68.0 ml RIGHT VENTRICLE            IVC RV Basal diam:  3.20 cm    IVC diam: 2.10 cm RV S prime:     6.16 cm/s TAPSE (M-mode): 1.4 cm LEFT ATRIUM             Index        RIGHT ATRIUM          Index LA diam:        4.30 cm 2.49 cm/m   RA Area:     9.16 cm LA Vol (A2C):   44.6 ml 25.82 ml/m  RA Volume:   15.10 ml 8.74 ml/m LA Vol (A4C):   53.5 ml 30.97 ml/m LA Biplane Vol: 54.2 ml 31.38 ml/m  AORTIC VALVE                    PULMONIC VALVE AV Area (Vmax):    1.76 cm     PV Vmax:       1.03 m/s AV Area (Vmean):   1.50 cm     PV Peak grad:  4.2 mmHg AV Area (VTI):     1.67 cm AV Vmax:           134.00 cm/s AV Vmean:          89.200 cm/s AV VTI:            0.242 m AV Peak Grad:      7.2 mmHg AV Mean Grad:      4.0 mmHg LVOT Vmax:         104.00 cm/s LVOT Vmean:        58.800 cm/s LVOT VTI:          0.178 m LVOT/AV VTI ratio: 0.74  AORTA Ao Root diam: 3.10 cm Ao Asc diam:  2.10 cm MITRAL VALVE MV Area (PHT): 7.51 cm     SHUNTS MV Area VTI:   1.72 cm     Systemic VTI:  0.18 m MV Peak grad:  4.8 mmHg     Systemic Diam: 1.70 cm MV Mean grad:  3.0 mmHg MV Vmax:       1.10 m/s MV Vmean:      73.3 cm/s MV Decel Time: 101  msec MV E velocity: 99.80 cm/s MV A velocity: 114.00 cm/s MV E/A ratio:  0.88 Vishnu Priya Mallipeddi Electronically signed by Diannah Late Mallipeddi Signature Date/Time: 05/18/2024/10:40:24 AM    Final    Overnight EEG with video Result Date: 05/18/2024 Shelton Arlin KIDD, MD     05/18/2024 12:19 PM Patient Name: CELIA GIBBONS MRN: [id number] Epilepsy Attending: Arlin KIDD Shelton Referring Physician/Provider: Merrianne Locus, MD Duration: 05/17/2024 1507 to 05/18/2024 9093 Patient history: 78yo M with ams. EEG to evaluate for seizure Level of alertness: Awake/ lethargic AEDs during EEG study: Propofol  Technical aspects: This EEG study was done with scalp electrodes positioned according to the 10-20 International system of electrode placement. Electrical activity was reviewed with band pass filter of 1-70Hz , sensitivity of 7 uV/mm, display speed of 53mm/sec with a 60Hz  notched filter applied as appropriate. EEG data were recorded continuously and digitally stored.  Video monitoring was available and reviewed as appropriate. Description: EEG initially showed continuous generalized 3 to 6 Hz theta-delta slowing. At around 2228 on 10/4/20205, EEG showed generalized attenuation and patient subsequently went to cardiac arrest. CPR was performed. EEG was disconnected between 05/17/2024 2250 to 05/18/2024 0031 as patient was being transferred to ICU. EEG then showed continuous generalized 3 to 6 Hz theta-delta slowing. Hyperventilation and photic stimulation were not performed.   ABNORMALITY - Continuous slow, generalized IMPRESSION: This study is suggestive of moderate to severe diffuse encephalopathy, nonspecific etiology. No seizures or epileptiform discharges were seen throughout the recording. Arlin KIDD Shelton   DG Abd Portable 1V Result Date: 05/18/2024 EXAM: 1 VIEW XRAY OF THE ABDOMEN 05/18/2024 01:08:42 AM CLINICAL HISTORY: Encounter for orogastric (OG) tube placement FINDINGS: LINES, TUBES AND DEVICES: Enteric tube in place with tip and side port overlying the proximal stomach. BOWEL: Nonobstructive bowel gas pattern. SOFT TISSUES: Diffuse vascular calcifications. Right iliac stent noted. BONES: No acute osseous abnormality. IMPRESSION: 1. Enteric tube in place with tip and side port overlying the proximal stomach. Electronically signed by: Dorethia Molt MD 05/18/2024 01:33 AM EDT RP Workstation: [id number]   DG CHEST PORT 1  VIEW Result Date: 05/17/2024 EXAM: 1 VIEW XRAY OF THE CHEST 05/17/2024 11:22:03 PM COMPARISON: None available. CLINICAL HISTORY: History of ETT. ETT placement FINDINGS: LINES, TUBES AND DEVICES: Endotracheal Tube tip at the level of the clavicular head is 4.6 cm above the carina. LUNGS AND PLEURA: Diffuse interstitial pulmonary infiltrate present in keeping with mild-to-moderate interstitial pulmonary edema. No pneumothorax. No pleural effusion. HEART AND MEDIASTINUM: Coronary artery bypass grafting has been performed. Cardiac size within normal limits. BONES AND SOFT TISSUES: Acute displaced fracture of the right eighth rib noted laterally. IMPRESSION: 1. Acute displaced fracture of the right eighth rib laterally. 2. Mild-to-moderate interstitial pulmonary edema. 3. Endotracheal tube tip at the level of the clavicular heads, 4.6 cm above the carina. Electronically signed by: Dorethia Molt MD 05/17/2024 11:29 PM EDT RP Workstation: [id number]   DG Chest Port 1 View Result Date: 05/17/2024 CLINICAL DATA:  [id number] Dyspnea [id number] Hypoxia [id number] EXAM: PORTABLE CHEST - 1 VIEW COMPARISON:  May 13, 2024 FINDINGS: Low lung volumes. Similar diffuse interstitial opacities throughout both lungs. No new airspace consolidation or pneumothorax. Moderate cardiomegaly. Sternotomy wires and CABG markers. Tortuous aorta with aortic atherosclerosis. Multilevel thoracic osteophytosis. IMPRESSION: Lower lung volumes. Otherwise, no significant interval change to the lungs. Electronically Signed   By: Rogelia Myers M.D.   On: 05/17/2024 09:16   MR BRAIN WO CONTRAST Result Date: 05/16/2024 EXAM: MRI BRAIN WITHOUT CONTRAST  05/16/2024 05:58:14 PM TECHNIQUE: Multiplanar multisequence MRI of the head/brain was performed without the administration of intravenous contrast. COMPARISON: 03/29/2018 CLINICAL HISTORY: Mental status change, unknown cause. 78 year old male with history of CABG, hypertension, CKD 3, and diabetes  mellitus. Patient presents with decreased appetite, inability to ambulate, confusion, tremors, and general decline over the past 2 months. He is awake, mildly lethargic, and confused, with confusion present all day today, previously mostly at night. Poor oral intake over the past few weeks. No vomiting, diarrhea, chest, or abdominal pain. Generalized weakness and inability to ambulate. Tremors noted today. FINDINGS: BRAIN AND VENTRICLES: Multiple sequences are markedly motion degraded. No acute infarct, intracranial hemorrhage, mass, midline shift, or hydrocephalus. Incidental cavum septum pellucidum et vergae. The sella is unremarkable. Normal flow voids. ORBITS: No acute abnormality. SINUSES AND MASTOIDS: No acute abnormality. BONES AND SOFT TISSUES: Normal marrow signal. No acute soft tissue abnormality. IMPRESSION: 1. No acute intracranial abnormality. 2. Exam degraded by motion, which may limit detection of subtle findings. 3. Incidental cavum septum pellucidum et vergae. Electronically signed by: Franky Stanford MD 05/16/2024 09:40 PM EDT RP Workstation: [id number]    ROS:  As stated above in the HPI otherwise negative.  Blood pressure (!) 112/53, pulse 84, temperature 98.2 F (36.8 C), resp. rate 17, height 5' 7 (1.702 m), weight 62.6 kg, SpO2 98%.    PE: Gen: Intubated and sedated Lungs: CTA Bilaterally CV: RRR without M/G/R ABD: Soft, NTND, +BS Ext: No C/C/E  Assessment/Plan: 1) Positive blood in the OG aspirate. 2) History of esophagitis. 3) Anemia.   The patient is currently stable.  He remains anemic, but it does not appear that he has any further bleeding in the OG.  With the history of the esophagitis a repeat EGD is necessary for evaluation.  Plan: 1) EGD tomorrow with Dr. Leigh. 2) Continue with PPI.  Bethzaida Boord D 05/18/2024, 4:13 PM

## 2024-05-18 NOTE — Significant Event (Signed)
 RN and NT in room repositioning pt after starting IV thiamine. Wife in room. Pt did not have his O2 on and Hoven was  replaced and pt kept taking off. Pt moving around in bed and asked if he was in pain to which he replied yes, wife stated pt has back pain. Pt repositioned in bed and pulled up with pillow propped under L side per pt preference. Pt noted to start breathing heavy and irregular and pt nodded yes when asked if he was SOB. Lungs auscultated and coarse rales and rhonchi heard, gurgling heard when pt was coughing. O2 sat obtained and was 49 then immediately dropped to 39 when code was called, pt had a pulse at this time. Pt quickly became unresponsive, then crash cart brought in, pt initially thought to be in vtach and then went asystole with loss of pulse. CPR began. Code team responded and pt was intubated, IO access obtained after some difficulty. Pt had multiple PEA episodes. Pt transferred to 2M11.

## 2024-05-18 NOTE — Progress Notes (Signed)
 BLE venous duplex has been completed.   Results can be found under chart review under CV PROC. 05/18/2024 4:56 PM Jazmyn Offner RVT, RDMS

## 2024-05-18 NOTE — Consult Note (Addendum)
   Cardiology Consultation   Patient ID: [name] MRN: [id number]; DOB: 1946/02/17  Admit date: 05/13/2024 Date of Consult: 05/18/2024  PCP:  [facility]   History of Present Illness:   [name] is a 78 year old man who I am seeing today for an evaluation of postcardiac arrest care at the request of Dr. Loreli.  The patient's medical history includes coronary artery disease post CABG, diabetes, hypertension, hyperlipidemia, CKD 4, COPD, atrial tachycardia and diverticulitis.  Yesterday he was admitted to the ICU after a PEA arrest.  History is somewhat difficult to obtain from code documentation but it appears that the patient was reporting back pain.  He was uncomfortable and ultimately took his oxygen off.  His O2 sats began to fall and then he lost consciousness.  CPR was started and he had multiple rounds of ACLS.  He was intubated and then transferred to 34M.   Past medical, surgical, social and family history reviewed.  ROS:  Please see the history of present illness.  All other ROS reviewed and negative.     Physical Exam/Data:   Vitals:   05/18/24 0815 05/18/24 0817 05/18/24 1043 05/18/24 1153  BP: 129/60  122/60   Pulse: 91  85   Resp: 15     Temp: 98.2 F (36.8 C)     TempSrc:  Bladder  Bladder  SpO2: 100%     Weight:      Height:        General: Elderly.  Intubated.  Sedated. Cardiac: Warm extremities. Lungs: Ventilated.  Some coarse breath sounds scattered. Psych: Sedated  EKG:  The EKG was personally reviewed and demonstrates: Sinus rhythm.  No acute ischemic findings  Telemetry:  Telemetry was personally reviewed and demonstrates: Sinus rhythm.  I see artifact consistent with CPR but I do not see any clear evidence of ventricular tachycardia as previously reported.  No high degree AV block.  No pauses.  May 18, 2024 echo EF 60% RV normal No significant valve abnormalities   Assessment and Plan:   #Cardiac arrest #PEA arrest I suspect  his cardiac arrest was secondary to severe hypoxia given it occurred when he was off oxygen and was preceded by low values on the pulse oximetry.  I do not have any evidence thus far of a primary arrhythmic event.    Would recommend supportive care at this time.  No plans for an ischemic evaluation at this time.  Keep K greater than 4 mag greater than 2.  #Anemia Management per primary team.    Ole T. Cindie, MD, [facility], [facility] Cardiac Electrophysiology

## 2024-05-18 NOTE — Progress Notes (Signed)
 SLP Cancellation Note  Patient Details Name: DYQUAN MINKS MRN: [id number] DOB: 1946-01-03   Cancelled treatment:       Reason Eval/Treat Not Completed: Medical issues which prohibited therapy; mechanical ventilation; will follow chart for readiness   Mitzie HUNT MA, CCC-SLP Acute Rehabilitation Services   05/18/2024, 7:35 AM

## 2024-05-18 NOTE — Code Documentation (Signed)
  Patient Name: [name]   MRN: [id number]   Date of Birth/ Sex: 03-02-1946 , male      Admission Date: 05/13/2024  Attending Provider: Maree Harder, MD  Primary Diagnosis: Acute renal failure superimposed on stage 4 chronic kidney disease [facility])   Indication: Pt was in his usual state of health until this PM, when he was noted to be in ventricular tachycardia then lost his pulse. Code blue was subsequently called. At the time of arrival on scene, ACLS protocol was underway and patient had initially regained pulse after receiving 1x epinephrine . Patient lost pulse after ~2 min; PEA on monitor. ACLS protocol was restarted subsequently   Technical Description:  - CPR performance duration:  2 minutes  - Was defibrillation or cardioversion used? No   - Was external pacer placed? No  - Was patient intubated pre/post CPR? Yes   Medications Administered: Y = Yes; Blank = No Amiodarone    Atropine    Calcium     Epinephrine   1  Lidocaine     Magnesium     Norepinephrine  Y  Phenylephrine     Sodium bicarbonate     Vasopressin     Post CPR evaluation:  - Final Status - Was patient successfully resuscitated ? Yes - What is current rhythm? Tachycardia, sinus - What is current hemodynamic status? Stable, in the ICU, n fentanyl  and propofol , without vasopressor support  Miscellaneous Information:  - Labs sent, including: Troponin, CMP, CBC, BNP  - Primary team notified?  Yes; bedside  - Family Notified? Yes; at bedside  - Additional notes/ transfer status: Patient transferred to 2M11; night intensivist, Dr.Shah, present in the unit.      Elnora Ip, MD  05/18/2024, 12:07 AM

## 2024-05-19 ENCOUNTER — Encounter (HOSPITAL_COMMUNITY): Payer: Self-pay | Admitting: Internal Medicine

## 2024-05-19 ENCOUNTER — Encounter (HOSPITAL_COMMUNITY): Admission: EM | Disposition: E | Payer: Self-pay | Source: Ambulatory Visit

## 2024-05-19 DIAGNOSIS — I469 Cardiac arrest, cause unspecified: Secondary | ICD-10-CM | POA: Diagnosis not present

## 2024-05-19 DIAGNOSIS — I468 Cardiac arrest due to other underlying condition: Secondary | ICD-10-CM | POA: Diagnosis not present

## 2024-05-19 DIAGNOSIS — D62 Acute posthemorrhagic anemia: Secondary | ICD-10-CM

## 2024-05-19 DIAGNOSIS — K7200 Acute and subacute hepatic failure without coma: Secondary | ICD-10-CM

## 2024-05-19 DIAGNOSIS — K922 Gastrointestinal hemorrhage, unspecified: Secondary | ICD-10-CM | POA: Diagnosis not present

## 2024-05-19 DIAGNOSIS — Z7902 Long term (current) use of antithrombotics/antiplatelets: Secondary | ICD-10-CM | POA: Diagnosis not present

## 2024-05-19 DIAGNOSIS — I2510 Atherosclerotic heart disease of native coronary artery without angina pectoris: Secondary | ICD-10-CM | POA: Diagnosis not present

## 2024-05-19 DIAGNOSIS — J810 Acute pulmonary edema: Secondary | ICD-10-CM | POA: Diagnosis not present

## 2024-05-19 DIAGNOSIS — K2091 Esophagitis, unspecified with bleeding: Secondary | ICD-10-CM

## 2024-05-19 DIAGNOSIS — R570 Cardiogenic shock: Secondary | ICD-10-CM

## 2024-05-19 DIAGNOSIS — J9601 Acute respiratory failure with hypoxia: Secondary | ICD-10-CM | POA: Diagnosis not present

## 2024-05-19 DIAGNOSIS — R0902 Hypoxemia: Secondary | ICD-10-CM | POA: Diagnosis not present

## 2024-05-19 HISTORY — PX: ESOPHAGOGASTRODUODENOSCOPY: SHX5428

## 2024-05-19 LAB — TYPE AND SCREEN
ABO/RH(D): A POS
Antibody Screen: NEGATIVE
Unit division: 0

## 2024-05-19 LAB — COMPREHENSIVE METABOLIC PANEL WITH GFR
ALT: 159 U/L — ABNORMAL HIGH (ref 0–44)
AST: 132 U/L — ABNORMAL HIGH (ref 15–41)
Albumin: 2.2 g/dL — ABNORMAL LOW (ref 3.5–5.0)
Alkaline Phosphatase: 85 U/L (ref 38–126)
Anion gap: 11 (ref 5–15)
BUN: 40 mg/dL — ABNORMAL HIGH (ref 8–23)
CO2: 20 mmol/L — ABNORMAL LOW (ref 22–32)
Calcium: 8 mg/dL — ABNORMAL LOW (ref 8.9–10.3)
Chloride: 108 mmol/L (ref 98–111)
Creatinine, Ser: 2.81 mg/dL — ABNORMAL HIGH (ref 0.61–1.24)
GFR, Estimated: 23 mL/min — ABNORMAL LOW (ref >60)
Glucose, Bld: 109 mg/dL — ABNORMAL HIGH (ref 70–99)
Potassium: 3.6 mmol/L (ref 3.5–5.1)
Sodium: 139 mmol/L (ref 135–145)
Total Bilirubin: 0.7 mg/dL (ref 0.0–1.2)
Total Protein: 5.6 g/dL — ABNORMAL LOW (ref 6.5–8.1)

## 2024-05-19 LAB — GLUCOSE, CAPILLARY
Glucose-Capillary: 103 mg/dL — ABNORMAL HIGH (ref 70–99)
Glucose-Capillary: 116 mg/dL — ABNORMAL HIGH (ref 70–99)
Glucose-Capillary: 128 mg/dL — ABNORMAL HIGH (ref 70–99)
Glucose-Capillary: 131 mg/dL — ABNORMAL HIGH (ref 70–99)
Glucose-Capillary: 77 mg/dL (ref 70–99)
Glucose-Capillary: 79 mg/dL (ref 70–99)
Glucose-Capillary: 84 mg/dL (ref 70–99)

## 2024-05-19 LAB — BPAM RBC
Blood Product Expiration Date: 202511012359
ISSUE DATE / TIME: 202510051456
Unit Type and Rh: 6200

## 2024-05-19 LAB — CBC
HCT: 25.1 % — ABNORMAL LOW (ref 39.0–52.0)
Hemoglobin: 7.8 g/dL — ABNORMAL LOW (ref 13.0–17.0)
MCH: 26.1 pg (ref 26.0–34.0)
MCHC: 31.1 g/dL (ref 30.0–36.0)
MCV: 83.9 fL (ref 80.0–100.0)
Platelets: 263 K/uL (ref 150–400)
RBC: 2.99 MIL/uL — ABNORMAL LOW (ref 4.22–5.81)
RDW: 19.9 % — ABNORMAL HIGH (ref 11.5–15.5)
WBC: 10.1 K/uL (ref 4.0–10.5)
nRBC: 0 % (ref 0.0–0.2)

## 2024-05-19 LAB — PROTIME-INR
INR: 1.4 — ABNORMAL HIGH (ref 0.8–1.2)
Prothrombin Time: 17.5 s — ABNORMAL HIGH (ref 11.4–15.2)

## 2024-05-19 LAB — MRSA NEXT GEN BY PCR, NASAL: MRSA by PCR Next Gen: NOT DETECTED

## 2024-05-19 SURGERY — EGD (ESOPHAGOGASTRODUODENOSCOPY)
Anesthesia: Moderate Sedation

## 2024-05-19 MED ORDER — MIDAZOLAM HCL 2 MG/2ML IJ SOLN
2.0000 mg | Freq: Once | INTRAMUSCULAR | Status: AC
  Administered 2024-05-19: 2 mg via INTRAVENOUS

## 2024-05-19 MED ORDER — MIDAZOLAM HCL 2 MG/2ML IJ SOLN
INTRAMUSCULAR | Status: AC
  Filled 2024-05-19: qty 2

## 2024-05-19 NOTE — Progress Notes (Signed)
 Pt with UE swelling. Wedding ring taken off pt and placed in sterile cup with pt name, and returned to [employer] (spouse) in waiting room.

## 2024-05-19 NOTE — Op Note (Addendum)
 [facility] Patient Name: [name] Procedure Date : 05/19/2024 MRN: [id number] Attending MD: Elspeth SQUIBB. Leigh , MD, [id number] Date of Birth: [birth year] CSN: [id number] Age: 78 Admit Type: Inpatient THIS EXAM WAS SENT IN ERROR

## 2024-05-19 NOTE — Progress Notes (Signed)
 Nutrition Follow-up  DOCUMENTATION CODES:  Severe malnutrition in context of chronic illness  INTERVENTION:  Per EGD report, NPO for now  Recommend placement of cortrak tube on 10/8 and initiate tube feeding for severe malnutrition and poor PO intake  NUTRITION DIAGNOSIS:  Severe Malnutrition related to chronic illness (COPD) as evidenced by severe fat depletion, severe muscle depletion. 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  9/30 - [facility] 10/4 - transferred to [facility] 78M and then later in the day had PEA cardiac arrest and transferred to 49M, intubated 10/6 - EGD planned after pt with drop in Hgb and hx of esophagitis. Found to have severe esophagitis with stigmata of recent bleeding. 4 clips were placed    Patient is currently intubated on ventilator support. Pt discussed during ICU rounds and with RN and MD. Pt to undergo EGD today and hopeful to be able to wean from vent afterwards. OGT in place for medications.   No family present in room but on exam, pt with severe muscle and fat deficits. Noted that family had previously reported a decline in health and intake PTA.  Reviewed EGD results and noted that pt with clips placed today for severe esophagitis. GI recommending pt remain NPO today and OGT was removed. Will possibly be able to advance to clears tomorrow if extubated. Pt severely malnourished with poor PO intake recorded this  admission and per family report prior. Enteral nutrition should be considered as a way to supplement intake. Will inquire about cortrak for next placement date 10/8.  MV: 11.3 L/min Temp (24hrs), Avg:98.6 F (37 C), Min:96.6 F (35.9 C), Max:99.7 F (37.6 C) MAP (cuff): 68-82 mmHg this AM  Propofol : 16.9 ml/hr  Admit weight: 59 kg   Current weight: 62.9 kg    Intake/Output Summary (Last 24 hours) at 05/19/2024 1154 Last data filed at 05/19/2024 0800 Gross per 24 hour  Intake 2361.47 ml  Output 1710 ml  Net 651.47 ml  Net IO Since Admission: 6,208.95 mL [05/19/24 1154]  Drains/Lines: UOP x 24 hours OGT  Average Meal Intake: 10/3-10/4: 3% intake x 3 recorded meals  Nutritionally Relevant Medications: Scheduled Meds:  docusate  100 mg Per Tube BID   Ensure Plus High Protein  237 mL Oral BID BM   insulin  aspart  0-5 Units Subcutaneous QHS   insulin  aspart  0-9 Units Subcutaneous TID WC   multivitamin with minerals  1 tablet Per Tube Daily   pantoprazole  IV  40 mg Intravenous Q12H   polyethylene glycol  17 g Per Tube BID   Continuous Infusions:  ampicillin-sulbactam (UNASYN) IV 3 g (05/19/24 1001)   lactated ringers      linezolid (ZYVOX) IV 600 mg (05/19/24 1047)   norepinephrine (LEVOPHED) Adult infusion 1 mcg/min (05/18/24 1700)   propofol  (DIPRIVAN ) infusion 45 mcg/kg/min (05/19/24 0800)   thiamine (VITAMIN B1) injection Stopped (05/19/24 0622)   PRN Meds: prochlorperazine, promethazine   Labs Reviewed: BUN 40, creatinine 2.81 CBG ranges from 93-192 mg/dL over the  last 24 hours HgbA1c 9.0% (9/30)  NUTRITION - FOCUSED PHYSICAL EXAM: Flowsheet Row Most Recent Value  Orbital Region Moderate depletion  Upper Arm Region Severe depletion  Thoracic and Lumbar Region Severe depletion  Buccal Region Mild depletion  Temple Region Moderate depletion  Clavicle Bone Region Mild depletion  Clavicle and Acromion Bone Region Severe depletion  Scapular Bone Region  Severe depletion  Dorsal Hand No depletion  [edema]  Patellar Region Severe depletion  Anterior Thigh Region Severe depletion  Posterior Calf Region Severe depletion  Edema (RD Assessment) Mild  [hands, lower arms]  Hair Reviewed  Eyes Reviewed  Mouth Reviewed  Skin Reviewed  Nails Reviewed    Diet Order:   Diet Order             Diet NPO time specified  Diet effective now                   EDUCATION NEEDS:  Not appropriate for education at this time  Skin:  Skin Assessment: Reviewed RN Assessment  Last BM:  10/1 - per flowsheet  Height:  Ht Readings from Last 1 Encounters:  05/19/24 5' 7 (1.702 m)    Weight:  Wt Readings from Last 1 Encounters:  05/19/24 62.9 kg    Ideal Body Weight:  67.3 kg  BMI:  Body mass index is 21.72 kg/m.  Estimated Nutritional Needs:  Kcal:  1800-2000 kcal/d Protein:  90-105 grams Fluid:  1.8-2L/d    Vernell Lukes, RD, LDN, CNSC Registered Dietitian II Please reach out via secure chat

## 2024-05-19 NOTE — Progress Notes (Addendum)
 Progress Note   Subjective  Patient without overt bleeding overnight per nursing. No melena, no further blood per OG. Hgb drifted to 6s yesterday, given unit of PRBC and Hgb up to 7s last night. Hgb pending from this AM. Daughter at bedside, she states patient was compliant with PPI at home and does not use NSAIDs. Not on any pressors, vitals stable, no reported arrhythmias.    Objective   Vital signs in last 24 hours: Temp:  [96.6 F (35.9 C)-99.7 F (37.6 C)] 99.7 F (37.6 C) (10/06 0645) Pulse Rate:  [60-103] 74 (10/06 0645) Resp:  [15-20] 17 (10/06 0645) BP: (105-148)/(46-72) 122/48 (10/06 0645) SpO2:  [95 %-100 %] 97 % (10/06 0645) FiO2 (%):  [40 %] 40 % (10/06 0739) Weight:  [62.9 kg] 62.9 kg (10/06 0600) Last BM Date : 05/14/24 General:    white male - intubated / sedated  Intake/Output from previous day: 10/05 0701 - 10/06 0700 In: 2454.5 [I.V.:636.6; Blood:386; IV Piggyback:1432] Out: 1710 [Urine:1710] Intake/Output this shift: No intake/output data recorded.  Lab Results: Recent Labs    05/17/24 2320 05/18/24 0018 05/18/24 1253 05/18/24 1902  WBC 20.0*  --  15.3* 12.8*  HGB 8.0* 7.5* 6.2* 7.0*  HCT 27.1* 22.0* 20.4* 22.6*  PLT 423*  --  333 275   BMET Recent Labs    05/17/24 0517 05/17/24 2320 05/18/24 0018  NA 140 140 140  K 3.9 4.1 4.2  CL 105 109  --   CO2 16* 13*  --   GLUCOSE 72 143*  --   BUN 36* 37*  --   CREATININE 2.59* 2.65*  --   CALCIUM  9.5 8.8*  --    LFT Recent Labs    05/17/24 0517 05/17/24 2320  PROT 7.1 6.4*  ALBUMIN  3.4* 2.5*  AST 23 475*  ALT 8 264*  ALKPHOS 122 102  BILITOT 0.4 1.0  BILIDIR 0.2  --   IBILI 0.2*  --    PT/INR Recent Labs    05/18/24 1253  LABPROT 17.9*  INR 1.4*    Studies/Results:      Assessment / Plan:   78 y/o male here with the following:  Worsening anemia / blood per OG - history of esophagitis Transaminitis likely secondary to shock liver S/p cardiac arrest secondary to  hypoxia CKD COPD History of CABG - on Plavix , echo shows normal EF  Stable overnight. Plavix  held. No further overt bleeding - s/p 1 unit PRBC transfusion, Hgb this AM is pending.   Discussed the patient's course with his daughter ([name]) and primary team (Dr. Zaida), in regards to how aggressive they want us  to be with EGD. He is not on any pressor support and they were planning on weaning off vent today and possible extubation. We discussed if they wanted to pursue an EGD before they weaned the vent off, reassess his esophagus for mucosal healing (history of esophagitis), assess his risk for rebleeding as they hope to resume Plavix  at some point. Patient's daughter states he has been compliant with his protonix  and avoiding NSAIDs. I have discussed risks / benefits of EGD - [name] fully understands this, already is sedated for the vent and tolerating it okay and appears stable. Family wishes to proceed. Will await Hgb today, transfuse additional PRBC if needed, and then proceed with EGD. Pending result, if no high risk pathology, may try to wean vent today and extubate if possible.  Otherwise he very likely has shock liver  in the setting of cardiac arrest. Rise in LAEs, INR was 1.4. US  shows stones in GB but CBD patent. Trend LAEs for now, hopefully these trend down over time further out from his cardiac arrest.   NPO for now while EGD pending today, continue PPI in the interim.  I spent 50 minutes of time reviewing the patient's chart, time spent with him and his family, and time spent with critical care, and documenting this encounter.  Anthony Naval, MD [facility]

## 2024-05-19 NOTE — Progress Notes (Signed)
   Patient Name: [name] Date of Encounter: 05/19/2024 [facility] Cardiologist: Dorn Lesches, MD   Interval Summary  .    Intubated  Vital Signs .    Vitals:   05/19/24 0730 05/19/24 0745 05/19/24 0800 05/19/24 0815  BP: (!) 141/58 (!) 136/48 (!) 120/59 (!) 119/48  Pulse: 71 76 76 69  Resp: 14 17 18 17   Temp: 99.5 F (37.5 C) 99.5 F (37.5 C) 99.3 F (37.4 C) 99.1 F (37.3 C)  TempSrc:      SpO2: 99% 95% 100% 98%  Weight:      Height:        Intake/Output Summary (Last 24 hours) at 05/19/2024 1049 Last data filed at 05/19/2024 0800 Gross per 24 hour  Intake 2389.69 ml  Output 1710 ml  Net 679.69 ml      05/19/2024    6:00 AM 05/13/2024    6:22 PM 05/13/2024   12:16 PM  Last 3 Weights  Weight (lbs) 138 lb 10.7 oz 138 lb 0.1 oz 130 lb  Weight (kg) 62.9 kg 62.6 kg 58.968 kg      Telemetry/ECG    05/19/2024 - Personally Reviewed Occasional PVC  EKG 05/18/2024: SInus rhythm 98 bpm No ST elecation Consider anterolateral ischemia   Physical Exam .   Physical Exam Vitals and nursing note reviewed.  Constitutional:      General: He is not in acute distress.    Comments: Intubated Does not respond to simple commands  Neck:     Vascular: No JVD.  Cardiovascular:     Rate and Rhythm: Normal rate and regular rhythm.     Heart sounds: Normal heart sounds. No murmur heard. Pulmonary:     Effort: Pulmonary effort is normal.     Breath sounds: Normal breath sounds. No wheezing or rales.  Musculoskeletal:     Right lower leg: No edema.     Left lower leg: No edema.      Assessment & Plan .     78 year old male with hypertension, hyperlipidemia, type 2 diabetes mellitus, CAD s/p CABG (LIMA-LAD, SVG-diag, SVG-OM1, OM2), PAD, COPD, CKD stage 4, diverticulitis (prior colostomy and takedown), h/o TIA, admitted with encephalopathy, followed by PEA arrest   Cardiac arrest: Mention of pulseless VT, but no clear evidence.  Appears likely to be more of  PEA arrest, possibly related to hypoxia. No ST elevation on EKG Mildly elevated but flat troponin not suggestive of acute coronary syndrome. Echocardiogram with preserved LVEF without wall motion abnormality. No indication for ischemic workup at this time. Continue supportive care as per critical care team. I will discontinue metoprolol  given recent cardiac arrest and no clear indication. If necessary, consider alternate antihypertensive therapy, such as amlodipine .  May not be able to use ARB given his advanced CKD.  Anemia, esophagitis: Management as per critical care team and GI. Agree with holding Plavix .  Also recommend discontinuing Pletal .  CAD: Stable. Antiplatelet therapy on hold in light of anemia. If and when possible, resume at least one antiplatelet agent, either Aspirin  or Plavix , along with Plavix .  Cardiology will sign off. Please call us  back in case of any questions.   For questions or updates, please contact  [facility] Please consult [email or web address] for contact info under        Signed, Newman JINNY Lawrence, MD

## 2024-05-19 NOTE — Progress Notes (Signed)
 NAME:  [name], MRN:  [id number], DOB:  23-Jul-1946, LOS: 6 ADMISSION DATE:  05/13/2024 CHIEF COMPLAINT:  Arrest.    History of Present Illness:  78 y/o male with PMH for CAD status post CABG, DM type 2, HTN, hyperlipidemia, CKD stage IV, COPD, PAD, atrial tachycardia, diverticulitis status post colostomy and subsequent takedown, TIA who presented initially from [facility] with generalized weakness, confusion, difficulty ambulating and decreased oral intake.  He has had several episodes of pneumonia and has been deteriorating since May 2025.  He was treated for CAP in August at which time he also was noted to have acute on chronic renal failure.  CT head negative on admission.  Her also had episodic delirium.   Review of the medical record shows the patient has had enhancing lesions in his lumbar and thoracic spine on MRI (12/07/23).  PET scan (12/27/23) also showed Multifocal mild areas of abnormal uptake along the skeleton including several ribs, vertebral bodies and scattered along the pelvis. Some of these lesions are associated with areas of bony sclerosis.  He had separate T9 and L3 bone biopsies without any metastatic carcinoma identified.  He has seen medical oncology and has undergone extensive workup including serum immunoglobulins and SPEP which have been negative.  In addition, tumor markers CEA, CA 19-9, alpha-fetoprotein, and PSA were all unremarkable. During this hospitalization, wife stated she has noticed some shaking in his right arm and right chest intermittently.    This evening he was on and he c/o back pain, took his O2 off and then went into pulseless V tach and then PEA.  Code blue called and CPR started.  Initially lost his pulse and then regained only to lose pulse again then regained.  He was subsequently transferred to 2M11.  Pertinent  Medical History   CAD status post CABG, DM type 2, HTN, hyperlipidemia, CKD stage IV, COPD, PAD, atrial tachycardia, diverticulitis status  post colostomy and subsequent takedown, TIA   Interim History / Subjective:  Currently sedated and intubated.  Opening eyes while on sedation.  Seem to recognize his wife's voice.  Significant Hospital Events: 10/4 transfer to ICU 10/4 Code blue  Objective    Blood pressure (!) 119/48, pulse 69, temperature 99.1 F (37.3 C), resp. rate 17, height 5' 7 (1.702 m), weight 62.9 kg, SpO2 98%.    Vent Mode: PRVC FiO2 (%):  [40 %] 40 % Set Rate:  [17 bmp] 17 bmp Vt Set:  [530 mL] 530 mL PEEP:  [5 cmH20] 5 cmH20 Plateau Pressure:  [17 cmH20-22 cmH20] 22 cmH20   Intake/Output Summary (Last 24 hours) at 05/19/2024 1400 Last data filed at 05/19/2024 0800 Gross per 24 hour  Intake 1529.09 ml  Output 1710 ml  Net -180.91 ml   Filed Weights   05/13/24 1216 05/13/24 1822 05/19/24 0600  Weight: 59 kg 62.6 kg 62.9 kg    Examination: General: Sedated and intubated. Lungs: clear to auscultation bilaterally.  Heart: regular rate rhythm, no murmur appreciated.  Abdomen: non tender, non distended. Normal BS.  Neuro: Sedated.  Opening eyes.  Pupils bilaterally equal.  Moving all 4 extremities.   Assessment and Plan  Presented to the hospital on 9/30 for generalized weakness confusion, decreased oral intake and difficulty ambulating.  Patient was admitted to hospitalist service.  Hospital admission in August 2025 for CAP.  He was being treated on the hospital floor for acute on chronic renal failure, metabolic encephalopathy and parotitis with antibiotics. Overnight on 10/4  had V. tach subsequently led to cardiac arrest, received 1 of epi, no shock.  During first pulse check was PEA arrest.  V. tach leading to cardiac arrest- ?  V. tach arrest versus PEA arrest: - Never received shock. - EKG postcardiac arrest with ST wave depression in lateral leads. - Troponin minimally elevated.  Will recheck.  BNP 1200. - Lactic acid 2.8> 1.1. - Echo: postcardiac arrest: EF normal with grade 1 diastolic  dysfunction. - Since RV is not dilated will hold off on CT PE for now. - Will get lower extremity venous Dopplers. - Cardiology consulted.  Cardiogenic shock: - Postcardiac arrest. - On low-dose Levophed. - Also contributed by sedation.  Monitor.   Pulmonary edema: Acute hypoxic respiratory failure: - Reportedly was hypoxic prior to cardiac arrest. - Status post Lasix. -Intubated.  LT VV. - Vent bundle. -Hold SBT/SAT today. - Hold further diuresis as below.  Shock liver: - Will trend LFTs. - Will get fibrinogen INR. - Liver ultrasound.  CGE: Suspected UGIB: - IV PPI twice daily. - Hold heparin .  Continue aspirin  and Plavix  and monitor hemoglobin closely with recent femorofemoral bypass. - Trend hemoglobin. - Maintain OG.  Acute metabolic encephalopathy: - Currently sedated.  Neurology following. - MRI motion degraded but negative. - EEG negative. - Plan for LP.  Which is pending.  AKI on CKD: - Baseline 1.9-2.2. -Monitor.  Minimally elevated postcardiac arrest. -Was being given IV fluids before. -Hold diuresis for now.  Failure to thrive: - Nutrition consult.  Possible seizures: -EEG negative for seizures. - Neurology following.  Parotitis: - Was started on Unasyn and Zyvox by hospitalist service. - Continue for now. Plan for 5 days total, can stop tomorrow   Peripheral arterial disease: - Status post femorofemoral bypass - 04/17/2024--angioplasty right common iliac and external iliac arteries--Dr. Gretta - Continue aspirin , Plavix , Pletal    C6 spinous process fracture -per neurosurgery--continue operative treatment; can use cervical collar if significant pain  Hypertension: Hyperlipidemia: -Will resume meds as appropriate.  DM 2: - SSI.  Wife updated at bedside.  Labs   CBC: Recent Labs  Lab 05/13/24 1240 05/14/24 0411 05/17/24 0517 05/17/24 2320 05/18/24 0018 05/18/24 1253 05/18/24 1902 05/19/24 0930  WBC 16.7*   < > 20.7* 20.0*  --   15.3* 12.8* 10.1  NEUTROABS 13.5*  --   --   --   --   --   --   --   HGB 9.1*   < > 8.4* 8.0* 7.5* 6.2* 7.0* 7.8*  HCT 29.2*   < > 27.3* 27.1* 22.0* 20.4* 22.6* 25.1*  MCV 79.8*   < > 81.5 82.4  --  82.6 83.4 83.9  PLT 544*   < > 427* 423*  --  333 275 263   < > = values in this interval not displayed.    Basic Metabolic Panel: Recent Labs  Lab 05/13/24 1250 05/14/24 0411 05/15/24 0414 05/16/24 0414 05/17/24 0517 05/17/24 2320 05/18/24 0018 05/19/24 0930  NA  --    < > 137 139 140 140 140 139  K  --    < > 4.3 4.0 3.9 4.1 4.2 3.6  CL  --    < > 104 106 105 109  --  108  CO2  --    < > 18* 18* 16* 13*  --  20*  GLUCOSE  --    < > 77 72 72 143*  --  109*  BUN  --    < >  49* 41* 36* 37*  --  40*  CREATININE  --    < > 3.32* 2.79* 2.59* 2.65*  --  2.81*  CALCIUM   --    < > 9.5 9.6 9.5 8.8*  --  8.0*  MG 2.2  --  2.3 2.2 2.0 2.0  --   --    < > = values in this interval not displayed.   GFR: Estimated Creatinine Clearance: 19.9 mL/min (A) (by C-G formula based on SCr of 2.81 mg/dL (H)). Recent Labs  Lab 05/13/24 1319 05/13/24 1441 05/14/24 0411 05/14/24 0840 05/15/24 0414 05/17/24 2320 05/18/24 0243 05/18/24 1253 05/18/24 1902 05/19/24 0930  PROCALCITON  --   --   --  0.40  --   --   --   --   --   --   WBC  --   --    < >  --    < > 20.0*  --  15.3* 12.8* 10.1  LATICACIDVEN 2.6* 1.6  --   --   --  2.8* 1.1  --   --   --    < > = values in this interval not displayed.    Liver Function Tests: Recent Labs  Lab 05/13/24 1240 05/17/24 0517 05/17/24 2320 05/19/24 0930  AST 29 23 475* 132*  ALT 10 8 264* 159*  ALKPHOS 138* 122 102 85  BILITOT 0.4 0.4 1.0 0.7  PROT 7.8 7.1 6.4* 5.6*  ALBUMIN  3.7 3.4* 2.5* 2.2*   No results for input(s): LIPASE, AMYLASE in the last 168 hours. Recent Labs  Lab 05/13/24 1319 05/17/24 0517  AMMONIA <13 <13    ABG    Component Value Date/Time   PHART 7.356 05/18/2024 0018   PCO2ART 26.3 (L) 05/18/2024 0018   PO2ART  72 (L) 05/18/2024 0018   HCO3 15.0 (L) 05/18/2024 0018   TCO2 16 (L) 05/18/2024 0018   ACIDBASEDEF 10.0 (H) 05/18/2024 0018   O2SAT 95 05/18/2024 0018      Past Medical History:  He,  has a past medical history of Anemia, CAD S/P percutaneous coronary angioplasty (1998), CKD (chronic kidney disease) stage 3, GFR 30-59 ml/min (HCC), Diabetes mellitus without complication (HCC), Diverticulitis (08/16/2017), GERD (gastroesophageal reflux disease), CABG (10/2015), Hypercholesteremia, Hypertension, Neuropathy, Peripheral vascular disease, Pneumonia, and Tachycardia.   Surgical History:   Past Surgical History:  Procedure Laterality Date   ABDOMINAL AORTOGRAM Bilateral 09/19/2016   Procedure: iliac;  Surgeon: Gaile LELON New, MD;  Location: [facility] INVASIVE CV LAB;  Service: Cardiovascular;  Laterality: Bilateral;   ABDOMINAL AORTOGRAM N/A 04/17/2024   Procedure: ABDOMINAL AORTOGRAM;  Surgeon: Gretta Lonni PARAS, MD;  Location: [facility] INVASIVE CV LAB;  Service: Cardiovascular;  Laterality: N/A;   ABDOMINAL AORTOGRAM W/LOWER EXTREMITY N/A 09/22/2021   Procedure: ABDOMINAL AORTOGRAM W/LOWER EXTREMITY;  Surgeon: Gretta Lonni PARAS, MD;  Location: [facility];  Service: Cardiovascular;  Laterality: N/A;   ABDOMINAL AORTOGRAM W/LOWER EXTREMITY N/A 10/05/2022   Procedure: ABDOMINAL AORTOGRAM W/LOWER EXTREMITY;  Surgeon: Gretta Lonni PARAS, MD;  Location: [facility];  Service: Cardiovascular;  Laterality: N/A;   BIOPSY  05/16/2023   Procedure: BIOPSY;  Surgeon: Shaaron Lamar HERO, MD;  Location: [facility];  Service: Endoscopy;;   CARDIAC CATHETERIZATION  2003   with stent   CARDIAC CATHETERIZATION N/A 10/11/2015   Procedure: Left Heart Cath and Coronary Angiography;  Surgeon: Peter M Swaziland, MD;  Location[facility] INVASIVE CV LAB;  Service: Cardiovascular;  Laterality: N/A;  COLON RESECTION N/A 12/13/2017   Procedure: EXPLORATORY LAPAROTOMY, SIGMOID COLECTOMY WITH COLOSTOMY;  Surgeon: Belinda Cough, MD;  Location: [facility] OR;  Service: General;  Laterality: N/A;   COLONOSCOPY N/A 09/22/2013   Procedure: COLONOSCOPY;  Surgeon: Lamar CHRISTELLA Hollingshead, MD;  Location: [facility];  Service: Endoscopy;  Laterality: N/A;  9:30 AM   COLONOSCOPY N/A 02/19/2018   Procedure: COLONOSCOPY;  Surgeon: Hollingshead Lamar CHRISTELLA, MD;  Location: [facility];  Service: Endoscopy;  Laterality: N/A;  2:00pm   COLONOSCOPY WITH PROPOFOL  N/A 05/16/2023   Procedure: COLONOSCOPY WITH PROPOFOL ;  Surgeon: Hollingshead Lamar CHRISTELLA, MD;  [facility];  Service: Endoscopy;  Laterality: N/A;  730am, asa 3   COLOSTOMY N/A 12/13/2017   Procedure: COLOSTOMY;  Surgeon: Belinda Cough, MD;  Location: [facility] OR;  Service: General;  Laterality: N/A;   COLOSTOMY REVERSAL N/A 03/26/2018   Procedure: COLOSTOMY REVERSAL;  Surgeon: Belinda Cough, MD;  Location: [facility] OR;  Service: General;  Laterality: N/A;   CORONARY ARTERY BYPASS GRAFT N/A 10/18/2015   Procedure: CORONARY ARTERY BYPASS GRAFTING (CABG) x  five, using left internal mammary artery and right leg greater saphenous vein harvested endoscopically;  Surgeon: Elspeth JAYSON Millers, MD;  Location: [facility] OR;  Service: Open Heart Surgery;  Laterality: N/A;   ENDARTERECTOMY FEMORAL Right 08/09/2016   Procedure: ENDARTERECTOMY FEMORAL WITH VEIN PATCH ANGIOPLASTY;  Surgeon: Gaile LELON New, MD;  Location: [facility] OR;  Service: Vascular;  Laterality: Right;   ESOPHAGOGASTRODUODENOSCOPY N/A 10/24/2017   Dr. Hollingshead: hiatal hernia   ESOPHAGOGASTRODUODENOSCOPY N/A 01/01/2024   Procedure: EGD (ESOPHAGOGASTRODUODENOSCOPY);  Surgeon: Cinderella Deatrice FALCON, MD;  [facility];  Service: Endoscopy;  Laterality: N/A;   ESOPHAGOGASTRODUODENOSCOPY (EGD) WITH PROPOFOL  N/A 05/16/2023   Procedure: ESOPHAGOGASTRODUODENOSCOPY (EGD) WITH PROPOFOL ;  Surgeon: Hollingshead Lamar CHRISTELLA, MD;  [facility];  Service: Endoscopy;  Laterality: N/A;   FEMORAL-FEMORAL BYPASS GRAFT Bilateral 08/09/2016   Procedure: REVISION  BYPASS GRAFT RIGHT FEMORAL-LEFT FEMORAL ARTERY;  Surgeon: Gaile LELON New, MD;  Location: [facility] OR;  Service: Vascular;  Laterality: Bilateral;   FEMORAL-POPLITEAL BYPASS GRAFT     INTRAMEDULLARY (IM) NAIL INTERTROCHANTERIC Left 04/03/2021   Procedure: LEFT  HIP INTRAMEDULLARY (IM) NAIL INTERTROCHANTRIC;  Surgeon: Vernetta Lonni GRADE, MD;  Location: [facility];  Service: Orthopedics;  Laterality: Left;   IR FLUORO GUIDED NEEDLE PLC ASPIRATION/INJECTION LOC  12/26/2023   IR IVC FILTER RETRIEVAL / S&I /IMG GUID/MOD SED  01/15/2024   LOWER EXTREMITY ANGIOGRAPHY N/A 04/17/2024   Procedure: Lower Extremity Angiography;  Surgeon: Gretta Lonni PARAS, MD;  Location: [facility] INVASIVE CV LAB;  Service: Cardiovascular;  Laterality: N/A;   LOWER EXTREMITY INTERVENTION N/A 04/17/2024   Procedure: LOWER EXTREMITY INTERVENTION;  Surgeon: Gretta Lonni PARAS, MD;  [facility];  Service: Cardiovascular;  Laterality: N/A;   MALONEY DILATION N/A 05/16/2023   Procedure: AGAPITO DILATION;  Surgeon: Hollingshead Lamar CHRISTELLA, MD;  [facility];  Service: Endoscopy;  Laterality: N/A;   PERIPHERAL VASCULAR BALLOON ANGIOPLASTY Right 09/22/2021   Procedure: PERIPHERAL VASCULAR BALLOON ANGIOPLASTY;  Surgeon: Gretta Lonni PARAS, MD;  [facility];  Service: Cardiovascular;  Laterality: Right;   PERIPHERAL VASCULAR CATHETERIZATION N/A 05/08/2016   Procedure: Lower Extremity Angiography;  Surgeon: Dorn PARAS Lesches, MD;  Location: [facility] INVASIVE CV LAB;  Service: Cardiovascular;  Laterality: N/A;   PERIPHERAL VASCULAR INTERVENTION Right 09/19/2016   Procedure: Peripheral Vascular Intervention;  Surgeon: Gaile LELON New, MD;  [facility]: [facility];  Service: Cardiovascular;  Laterality: Right;  ext iliac stent   PERIPHERAL VASCULAR INTERVENTION Right 09/22/2021   Procedure: PERIPHERAL VASCULAR INTERVENTION;  Surgeon: Gretta Lonni PARAS, MD;  [facility]: [facility];  Service: Cardiovascular;   Laterality: Right;   POLYPECTOMY  02/19/2018   Procedure: POLYPECTOMY;  Surgeon: Shaaron Lamar HERO, MD;  [facility];  Service: Endoscopy;;   PROCTOSCOPY  10/24/2017   Procedure: PROCTOSCOPY;  Surgeon: Shaaron Lamar HERO, MD;  [facility];  Service: Endoscopy;;   TEE WITHOUT CARDIOVERSION N/A 10/18/2015   Procedure: TRANSESOPHAGEAL ECHOCARDIOGRAM (TEE);  Surgeon: Elspeth JAYSON Millers, MD;  Location: [facility] OR;  Service: Open Heart Surgery;  Laterality: N/A;     Social History:   reports that he quit smoking about 23 years ago. His smoking use included cigarettes. He started smoking about 53 years ago. He has a 60 pack-year smoking history. He has never used smokeless tobacco. He reports that he does not drink alcohol  and does not use drugs.   Family History:  His family history includes Colon cancer in his brother; Heart attack in his mother; Stroke in his mother.   Allergies Allergies  Allergen Reactions   Baclofen Other (See Comments)    Altered mental status, extreme drowsiness   Acarbose Diarrhea     Home Medications  Prior to Admission medications   Medication Sig Start Date End Date Taking? Authorizing Provider  aspirin  EC 81 MG tablet Take 1 tablet (81 mg total) by mouth daily with breakfast. 05/23/19  Yes Emokpae, Courage, MD  atorvastatin  (LIPITOR ) 40 MG tablet Take 40 mg by mouth at bedtime. 02/07/24  Yes [provider]  cilostazol  (PLETAL ) 100 MG tablet Take 100 mg by mouth 2 (two) times daily. 02/07/24  Yes [provider]  clopidogrel  (PLAVIX ) 75 MG tablet Take 1 tablet (75 mg total) by mouth in the morning. 04/17/24  Yes Gretta Lonni PARAS, MD  insulin  glargine (LANTUS ) 100 UNIT/ML injection Inject 0.1 mLs (10 Units total) into the skin at bedtime. Patient taking differently: Inject 24 Units into the skin at bedtime. 12/11/23  Yes Johnson, Clanford L, MD  lidocaine  (LIDODERM ) 5 % Place 1 patch onto the skin daily. 05/05/24  Yes [provider]  magnesium  oxide (MAG-OX) 400 MG tablet Take 400 mg by mouth 2 (two) times daily. 12/04/23  Yes [provider]  metoprolol  succinate (TOPROL -XL) 50 MG 24 hr tablet Take 0.5 tablets (25 mg total) by mouth daily. 03/23/24  Yes Emokpae, Courage, MD  vitamin B-12 (CYANOCOBALAMIN ) 1000 MCG tablet Take 1,000 mcg by mouth in the morning.   Yes [provider]  amoxicillin -clavulanate (AUGMENTIN ) 875-125 MG tablet Take 1 tablet by mouth 2 (two) times daily. Patient not taking: Reported on 05/13/2024 05/05/24   [provider]  doxycycline  (VIBRA -TABS) 100 MG tablet Take 100 mg by mouth 2 (two) times daily. Patient not taking: Reported on 05/13/2024 05/05/24   [provider]  pantoprazole  (PROTONIX ) 40 MG tablet Take 1 tablet (40 mg total) by mouth daily. 03/23/24   Pearlean Manus, MD  pregabalin  (LYRICA ) 75 MG capsule Take 75 mg by mouth 2 (two) times daily. Patient not taking: Reported on 05/13/2024 01/22/24   [provider]     The patient is critically ill due to acute hypoxic respiratory failure requiring mechanical ventilation.  Critical care was necessary to treat or prevent imminent or life-threatening deterioration. Critical care time was spent by me on the following activities: development of a treatment  plan with the patient and/or surrogate as well as nursing, discussions with consultants, evaluation of the patient's response to treatment, examination of the patient, obtaining a history from the patient or surrogate, ordering and performing treatments and interventions, ordering and review of laboratory studies, ordering and review of radiographic studies, review of telemetry data including pulse oximetry, re-evaluation of patient's condition and participation in multidisciplinary rounds.   I personally spent 54  minutes providing critical care not including any separately billable procedures.   Zola LOISE Herter, MD [facility]  Critical Care 05/19/2024 2:06 PM    05/19/2024, 2:00 PM

## 2024-05-19 NOTE — Progress Notes (Signed)
 SLP Cancellation Note  Patient Details Name: [name] MRN: [id number] DOB: 1946/02/29   Cancelled treatment:       Reason Eval/Treat Not Completed: Patient not medically ready. Pt on vent, also pending EGD. Will f/u as able.    Leita SAILOR., M.A. CCC-SLP Acute Rehabilitation Services Office: [phone]  Secure chat preferred  05/19/2024, 7:50 AM

## 2024-05-19 NOTE — Progress Notes (Signed)
 eLink Physician-Brief Progress Note Patient Name: JEROMY BORCHERDING DOB: 09-30-1945 MRN: [id number]   Date of Service  05/19/2024  HPI/Events of Note  Ventilated with multiorgan dysfunction  eICU Interventions  Bilateral wrist restraints renewed for safety     Intervention Category Minor Interventions: Agitation / anxiety - evaluation and management  Gredmarie Delange 05/19/2024, 12:39 AM

## 2024-05-19 NOTE — Progress Notes (Signed)
 eLink Physician-Brief Progress Note Patient Name: [name] DOB: 04-09-1946 MRN: [id number]   Date of Service  05/19/2024  HPI/Events of Note  Ventilated with multiorgan dysfunction  eICU Interventions  Bilateral wrist restraints renewed for safety        Jenisis Harmsen 05/19/2024, 11:00 PM

## 2024-05-19 NOTE — Op Note (Signed)
 [facility] Patient Name: [name] Procedure Date : 05/19/2024 MRN: [id number] Attending MD: Elspeth SQUIBB. Leigh , MD, [id number] Date of Birth: March 29, 1946 CSN: [id number] Age: 78 Admit Type: Inpatient Procedure:                Upper GI endoscopy Indications:              Suspected upper gastrointestinal bleeding - blood                            out of OG, intubated. History of esophagitis on 2                            prior EGDs over the past year. Family reports                            compliance with pantoprazole  once daily but states                            he continues to have heartburn despite this. Providers:                Elspeth SQUIBB. Leigh, MD, Robie Breed,                            RN, Felice Sar, Technician Referring MD:              Medicines:                sedation per ICU team - intubated Complications:            No immediate complications. Estimated blood loss:                            Minimal. Estimated Blood Loss:     Estimated blood loss was minimal. Procedure:                Pre-Anesthesia Assessment:                           - Prior to the procedure, a History and Physical                            was performed, and patient medications and                            allergies were reviewed. The patient's tolerance of                            previous anesthesia was also reviewed. The risks                            and benefits of the procedure and the sedation                            options and risks were discussed with the patient.  All questions were answered, and informed consent                            was obtained. Prior Anticoagulants: The patient has                            taken Plavix  (clopidogrel ), last dose was 1 day                            prior to procedure. ASA Grade Assessment: IV - A                            patient with severe systemic disease that is a                             constant threat to life. After reviewing the risks                            and benefits, the patient was deemed in                            satisfactory condition to undergo the procedure.                           After obtaining informed consent, the endoscope was                            passed under direct vision. Throughout the                            procedure, the patient's blood pressure, pulse, and                            oxygen saturations were monitored continuously. The                            [id number]-H190 ([id number]) Olympus endoscope was introduced                            through the mouth, and advanced to the second part                            of duodenum. The upper GI endoscopy was                            accomplished without difficulty. The patient                            tolerated the procedure well. Scope In: Scope Out: Findings:      Esophagogastric landmarks were identified: the Z-line was found at 40       cm, the gastroesophageal junction was found at 40 cm and the upper       extent of the gastric folds was found  at 42 cm from the incisors.      A 2 cm hiatal hernia was present.      Severe linear esophagitis was found starting at the GEJ and extending up       at least 5cm or so. There was stigmata of recent bleeding in the distal       esophagus in this area. Three hemostasis clips placed in this area to       prevent recurrent bleeding. Around 37cm from the incisors there was a       focal adherent clot which was removed, and underneath was a friable       ulcer. One hemostasis clip placed across this area. In total, 4       hemostasis clips placed (3 distal near GEJ / distal esophagus, one       isolated area of adherent clot from ulcer). Duraclips used given shorter       tail than other clips.      The exam of the esophagus was otherwise normal.      The entire examined stomach was normal.      The examined  duodenum was normal. Impression:               - Esophagogastric landmarks identified.                           - 2 cm hiatal hernia.                           - Severe esophagitis with stigmata of recent                            bleeding. 4 clips were placed as outlined.                           - Normal stomach.                           - Normal examined duodenum.                           Unfortunately has had recurrent / persistent                            esophagitis over time, family reports poor control                            of GERD symptoms despite 40mg  protonix  daily. He                            has had bleeding in the setting of recent cardiac                            arrest / Plavix  use. Recommendation:           - Remain in the ICU for ongoing care.                           - Wean vent and extubate if possible per  primary                            team                           - NPO for now                           - Please AVOID placing any OG tube given recent                            endoscopic intervention in the esophagus                           - Continue present medications (protonix  40mg  IV                            BID)                           - Continue to hold Plavix  for now, monitor for                            rebleeding                           - If he extubates, consider clear liquids later                            tomorrow                           - As outpatient would switch him to Voquezna 20mg  /                            day if it can get approved by insurance, will                            discuss with pharmacy                           - Will reassess him tomorrow, call with questions. Procedure Code(s):        --- Professional ---                           [phone], Esophagogastroduodenoscopy, flexible,                            transoral; with control of bleeding, any method Diagnosis Code(s):        --- Professional  ---                           K44.9, Diaphragmatic hernia without obstruction or                            gangrene  K20.90, Esophagitis, unspecified without bleeding CPT copyright 2022 American [employer] Association. All rights reserved. The codes documented in this report are preliminary and upon coder review may  be revised to meet current compliance requirements. Elspeth P. Talis Iwan, MD 05/19/2024 3:47:56 PM This report has been signed electronically. Number of Addenda: 0

## 2024-05-19 NOTE — TOC Progression Note (Signed)
 Transition of Care [facility]) - Progression Note    Patient Details  Name: [name] MRN: [id number] Date of Birth: [birth year]  [facility]) CM/SW Contact  Lauraine FORBES Saa, LCSWA Phone Number: 05/19/2024, 12:54 PM  Clinical Narrative:     12:54 PM Per chart review, patient resides at home with spouse. Patient has a PCP and insurance. Patient has HH history with [facility]. Patient has D[facility] (RW, BSC, manual wheelchair) [facility] with [facility]. Patient's preferred pharmacy's are [facility] [address] and [facility]. Patient is anticipated to discharge to [facility]. Patient's SNF [facility] insurance authorization was approved. Patient is currently intubated with NG/OG tube. CSW will continue to follow.  Expected Discharge Plan: Skilled Nursing Facility Barriers to Discharge: Continued Medical Work up               Expected Discharge Plan and Services In-house Referral: Clinical Social Work Discharge Planning Services: CM Consult Post Acute Care Choice: Skilled Nursing Facility Living arrangements for the past 2 months: Single Family Home                                       Social Drivers of Health (SDOH) Interventions SDOH Screenings   Food Insecurity: No Food Insecurity (05/14/2024)  Housing: Low Risk  (05/13/2024)  Transportation Needs: No Transportation Needs (05/13/2024)  Utilities: Not At Risk (05/13/2024)  Depression (PHQ2-9): Low Risk  (04/10/2024)  Financial Resource Strain: Low Risk  (10/08/2023)   Received from [facility]  Social Connections: Socially Integrated (05/14/2024)  Tobacco Use: Medium Risk (05/13/2024)  Health Literacy: High Risk (10/08/2023)   Received from [facility]    Readmission Risk Interventions    05/14/2024   10:56 AM 03/22/2024    4:21 PM 01/29/2024   11:17 AM  Readmission Risk Prevention Plan  Transportation Screening Complete Complete Complete  PCP or Specialist Appt within 3-5  Days   Complete  HRI or Home Care Consult   Complete  Palliative Care Screening   Not Applicable  Medication Review (RN Care Manager) Complete Complete Complete  PCP or Specialist appointment within 3-5 days of discharge  Complete   HRI or Home Care Consult Complete Complete   SW Recovery Care/Counseling Consult Complete Complete   Palliative Care Screening Not Applicable Not Applicable   Skilled Nursing Facility Complete Not Applicable

## 2024-05-20 ENCOUNTER — Inpatient Hospital Stay (HOSPITAL_COMMUNITY)

## 2024-05-20 ENCOUNTER — Encounter (HOSPITAL_COMMUNITY): Payer: Self-pay | Admitting: Gastroenterology

## 2024-05-20 ENCOUNTER — Telehealth (HOSPITAL_COMMUNITY): Payer: Self-pay | Admitting: Pharmacy Technician

## 2024-05-20 ENCOUNTER — Other Ambulatory Visit (HOSPITAL_COMMUNITY): Payer: Self-pay

## 2024-05-20 DIAGNOSIS — K2091 Esophagitis, unspecified with bleeding: Secondary | ICD-10-CM | POA: Diagnosis not present

## 2024-05-20 DIAGNOSIS — J810 Acute pulmonary edema: Secondary | ICD-10-CM | POA: Diagnosis not present

## 2024-05-20 DIAGNOSIS — K7200 Acute and subacute hepatic failure without coma: Secondary | ICD-10-CM

## 2024-05-20 DIAGNOSIS — R570 Cardiogenic shock: Secondary | ICD-10-CM | POA: Diagnosis not present

## 2024-05-20 DIAGNOSIS — J9601 Acute respiratory failure with hypoxia: Secondary | ICD-10-CM | POA: Diagnosis not present

## 2024-05-20 DIAGNOSIS — I469 Cardiac arrest, cause unspecified: Secondary | ICD-10-CM | POA: Diagnosis not present

## 2024-05-20 LAB — HEPATIC FUNCTION PANEL
ALT: 121 U/L — ABNORMAL HIGH (ref 0–44)
AST: 68 U/L — ABNORMAL HIGH (ref 15–41)
Albumin: 2.4 g/dL — ABNORMAL LOW (ref 3.5–5.0)
Alkaline Phosphatase: 95 U/L (ref 38–126)
Bilirubin, Direct: 0.2 mg/dL (ref 0.0–0.2)
Indirect Bilirubin: 0.6 mg/dL (ref 0.3–0.9)
Total Bilirubin: 0.8 mg/dL (ref 0.0–1.2)
Total Protein: 6.4 g/dL — ABNORMAL LOW (ref 6.5–8.1)

## 2024-05-20 LAB — CBC
HCT: 23.8 % — ABNORMAL LOW (ref 39.0–52.0)
Hemoglobin: 7.4 g/dL — ABNORMAL LOW (ref 13.0–17.0)
MCH: 26.3 pg (ref 26.0–34.0)
MCHC: 31.1 g/dL (ref 30.0–36.0)
MCV: 84.7 fL (ref 80.0–100.0)
Platelets: 239 K/uL (ref 150–400)
RBC: 2.81 MIL/uL — ABNORMAL LOW (ref 4.22–5.81)
RDW: 20.1 % — ABNORMAL HIGH (ref 11.5–15.5)
WBC: 10.6 K/uL — ABNORMAL HIGH (ref 4.0–10.5)
nRBC: 0 % (ref 0.0–0.2)

## 2024-05-20 LAB — GLUCOSE, CAPILLARY
Glucose-Capillary: 71 mg/dL (ref 70–99)
Glucose-Capillary: 72 mg/dL (ref 70–99)
Glucose-Capillary: 75 mg/dL (ref 70–99)
Glucose-Capillary: 87 mg/dL (ref 70–99)
Glucose-Capillary: 93 mg/dL (ref 70–99)
Glucose-Capillary: 96 mg/dL (ref 70–99)

## 2024-05-20 LAB — BASIC METABOLIC PANEL WITH GFR
Anion gap: 12 (ref 5–15)
BUN: 36 mg/dL — ABNORMAL HIGH (ref 8–23)
CO2: 20 mmol/L — ABNORMAL LOW (ref 22–32)
Calcium: 8.2 mg/dL — ABNORMAL LOW (ref 8.9–10.3)
Chloride: 109 mmol/L (ref 98–111)
Creatinine, Ser: 2.78 mg/dL — ABNORMAL HIGH (ref 0.61–1.24)
GFR, Estimated: 23 mL/min — ABNORMAL LOW (ref >60)
Glucose, Bld: 81 mg/dL (ref 70–99)
Potassium: 3.3 mmol/L — ABNORMAL LOW (ref 3.5–5.1)
Sodium: 141 mmol/L (ref 135–145)

## 2024-05-20 LAB — PROTIME-INR
INR: 1.2 (ref 0.8–1.2)
Prothrombin Time: 16.1 s — ABNORMAL HIGH (ref 11.4–15.2)

## 2024-05-20 MED ORDER — POTASSIUM CHLORIDE 10 MEQ/100ML IV SOLN
10.0000 meq | INTRAVENOUS | Status: AC
  Administered 2024-05-20 (×6): 10 meq via INTRAVENOUS
  Filled 2024-05-20 (×6): qty 100

## 2024-05-20 MED ORDER — BISACODYL 10 MG RE SUPP
10.0000 mg | Freq: Once | RECTAL | Status: AC
  Administered 2024-05-20: 10 mg via RECTAL
  Filled 2024-05-20: qty 1

## 2024-05-20 MED ORDER — INSULIN ASPART 100 UNIT/ML IJ SOLN
0.0000 [IU] | INTRAMUSCULAR | Status: DC
  Administered 2024-05-21 (×2): 1 [IU] via SUBCUTANEOUS
  Administered 2024-05-22 (×2): 2 [IU] via SUBCUTANEOUS

## 2024-05-20 NOTE — Telephone Encounter (Signed)
 Pharmacy Patient Advocate Encounter   Received notification from Inpatient Request that prior authorization for Voquezna 20MG  tablets is required/requested.   Insurance verification completed.   The patient is insured through [facility].   Per test claim: PA required; PA submitted to above mentioned insurance via Latent Key/confirmation #/EOC [facility] Status is pending

## 2024-05-20 NOTE — Telephone Encounter (Signed)
 Pharmacy Patient Advocate Encounter  Received notification from [facility] that Prior Authorization for Voquezna 20MG  tablets has been APPROVED from 05/20/2024 to 08/13/2024. Ran test claim, Copay is $234.95. This test claim was processed through [facility]- copay amounts may vary at other pharmacies due to pharmacy/plan contracts, or as the patient moves through the different stages of their insurance plan.   PA #/Case ID/Reference #: [id number]

## 2024-05-20 NOTE — Progress Notes (Signed)
 Brief Nutrition Support Note   Discussed with GI, recommended that no tubes be placed in the esophagus for several weeks due to the severe esophagitis with bleeding and with the placement of several clips. Pt ok to have clear liquids if extubated and GI recommends initiation of TPN if nutrition support is needed. Inquired when pt could be advanced to soft foods if diet was tolerated - awaiting response.   Pt remains intubated this AM. Pt discussed during ICU rounds and with RN and MD. Not following commands and plan is to re-scan head to ensure there are no acute changes s/p cardiac arrest. Discussed pt's malnourished state and recommendation to avoid tubes. No plan to extubate today as pt's mental status is poor. Not a candidate for PICC placement due to renal function, CVC would need to be placed by CCM team. Currently plan is to initiate TPN if pt not able to be extubated by the AM.   Will follow-up with GI about when pt can be advanced past liquids if he were able to extubate. Would recommend TPN be initiated until intake is sufficient to meet >60% of needs as pt's intake has been poor this admission x7 days and is severely malnourished.   Pt will be at risk of refeeding when nutrition is initiated.   Will follow-up as planned.    Vernell Lukes, RD, LDN, CNSC Registered Dietitian II Please reach out via secure chat

## 2024-05-20 NOTE — TOC Progression Note (Addendum)
 Transition of Care [facility]) - Progression Note    Patient Details  Name: [name] MRN: [id number] Date of Birth: [birth year]  Transition of Care [facility]) CM/SW Contact  Lauraine FORBES Saa, LCSWA Phone Number: 05/20/2024, 12:35 PM  Clinical Narrative:     12:35 PM Per progressions, patient remains intubated. MD to place order for head CT. Per chart review, patient is anticipated to receive cortrak tomorrow. CSW will continue to follow.  Expected Discharge Plan: Skilled Nursing Facility Barriers to Discharge: Continued Medical Work up               Expected Discharge Plan and Services In-house Referral: Clinical Social Work Discharge Planning Services: CM Consult Post Acute Care Choice: Skilled Nursing Facility Living arrangements for the past 2 months: Single Family Home                                       Social Drivers of Health (SDOH) Interventions SDOH Screenings   Food Insecurity: No Food Insecurity (05/14/2024)  Housing: Low Risk  (05/13/2024)  Transportation Needs: No Transportation Needs (05/13/2024)  Utilities: Not At Risk (05/13/2024)  Depression (PHQ2-9): Low Risk  (04/10/2024)  Financial Resource Strain: Low Risk  (10/08/2023)   Received from [facility]  Social Connections: Socially Integrated (05/14/2024)  Tobacco Use: Medium Risk (05/19/2024)  Health Literacy: High Risk (10/08/2023)   Received from [facility]    Readmission Risk Interventions    05/14/2024   10:56 AM 03/22/2024    4:21 PM 01/29/2024   11:17 AM  Readmission Risk Prevention Plan  Transportation Screening Complete Complete Complete  PCP or Specialist Appt within 3-5 Days   Complete  HRI or Home Care Consult   Complete  Palliative Care Screening   Not Applicable  Medication Review (RN Care Manager) Complete Complete Complete  PCP or Specialist appointment within 3-5 days of discharge  Complete   HRI or Home Care Consult Complete Complete   SW Recovery Care/Counseling Consult  Complete Complete   Palliative Care Screening Not Applicable Not Applicable   Skilled Nursing Facility Complete Not Applicable

## 2024-05-20 NOTE — Progress Notes (Signed)
 RT NOTE: patient placed on CPAP/PSV of 10/5 at 0725.  Tolerating well at this time.  Will continue to monitor and wean as tolerated.

## 2024-05-20 NOTE — Progress Notes (Signed)
 NAME:  [name], MRN:  [id number], DOB:  11-26-1945, LOS: 7 ADMISSION DATE:  05/13/2024 CHIEF COMPLAINT:  Arrest.    History of Present Illness:  78 y/o male with PMH for CAD status post CABG, DM type 2, HTN, hyperlipidemia, CKD stage IV, COPD, PAD, atrial tachycardia, diverticulitis status post colostomy and subsequent takedown, TIA who presented initially from [facility] with generalized weakness, confusion, difficulty ambulating and decreased oral intake.  He has had several episodes of pneumonia and has been deteriorating since May 2025.  He was treated for CAP in August at which time he also was noted to have acute on chronic renal failure.  CT head negative on admission.  Her also had episodic delirium.   Review of the medical record shows the patient has had enhancing lesions in his lumbar and thoracic spine on MRI (12/07/23).  PET scan (12/27/23) also showed Multifocal mild areas of abnormal uptake along the skeleton including several ribs, vertebral bodies and scattered along the pelvis. Some of these lesions are associated with areas of bony sclerosis.  He had separate T9 and L3 bone biopsies without any metastatic carcinoma identified.  He has seen medical oncology and has undergone extensive workup including serum immunoglobulins and SPEP which have been negative.  In addition, tumor markers CEA, CA 19-9, alpha-fetoprotein, and PSA were all unremarkable. During this hospitalization, wife stated she has noticed some shaking in his right arm and right chest intermittently.    This evening he was on and he c/o back pain, took his O2 off and then went into pulseless V tach and then PEA.  Code blue called and CPR started.  Initially lost his pulse and then regained only to lose pulse again then regained.  He was subsequently transferred to 2M11.  Pertinent  Medical History   CAD status post CABG, DM type 2, HTN, hyperlipidemia, CKD stage IV, COPD, PAD, atrial tachycardia, diverticulitis status  post colostomy and subsequent takedown, TIA   Interim History / Subjective:  Currently sedated and intubated.  Opening eyes while on sedation.  Seem to recognize his wife's voice.  Significant Hospital Events: 10/4 transfer to ICU 10/4 Code blue  Objective    Blood pressure (!) 133/51, pulse 61, temperature 97.7 F (36.5 C), resp. rate 17, height 5' 7 (1.702 m), weight 64.3 kg, SpO2 100%.    Vent Mode: PSV;CPAP FiO2 (%):  [40 %] 40 % Set Rate:  [17 bmp] 17 bmp Vt Set:  [530 mL] 530 mL PEEP:  [5 cmH20] 5 cmH20 Pressure Support:  [5 cmH20-10 cmH20] 5 cmH20 Plateau Pressure:  [18 cmH20-22 cmH20] 19 cmH20   Intake/Output Summary (Last 24 hours) at 05/20/2024 1040 Last data filed at 05/20/2024 0800 Gross per 24 hour  Intake 1085.11 ml  Output 1035 ml  Net 50.11 ml   Filed Weights   05/13/24 1822 05/19/24 0600 05/20/24 0600  Weight: 62.6 kg 62.9 kg 64.3 kg    Examination: General: Sedated and intubated. Opens eyes, not in distress  Lungs: clear to auscultation bilaterally.  Heart: regular rate rhythm, no murmur appreciated.  Abdomen: non tender, non distended. Normal BS.  Neuro: Sedated.  Opening eyes.  Pupils bilaterally equal.  Moving all 4 extremities. Does not follow commands    Assessment and Plan  Presented to the hospital on 9/30 for generalized weakness confusion, decreased oral intake and difficulty ambulating.  Patient was admitted to hospitalist service.  Hospital admission in August 2025 for CAP.  He was being treated  on the hospital floor for acute on chronic renal failure, metabolic encephalopathy and parotitis with antibiotics. Overnight on 10/4 had V. tach subsequently led to cardiac arrest, received 1 of epi, no shock.  During first pulse check was PEA arrest.  V. tach leading to cardiac arrest- ?  V. tach arrest versus PEA arrest: - Never received shock. - EKG postcardiac arrest with ST wave depression in lateral leads. - Troponin minimally elevated.  Will  recheck.  BNP 1200. - Lactic acid 2.8> 1.1. - Echo: postcardiac arrest: EF normal with grade 1 diastolic dysfunction. - Will get lower extremity venous Dopplers. - Cardiology consulted.  Cardiogenic shock: - Postcardiac arrest. - off levophed  - Also contributed by sedation.  Monitor.   Pulmonary edema: Acute hypoxic respiratory failure: - Reportedly was hypoxic prior to cardiac arrest. - Vent bundle. - tolerated SBT today but barrier to extubation is mentation  - Hold further diuresis as below.  Shock liver improving : - Will trend LFTs. - Will get fibrinogen INR. - Liver ultrasound.  CGE: Suspected UGIB: - IV PPI twice daily. - Hold heparin .  Continue aspirin  and Plavix  and monitor hemoglobin closely with recent femorofemoral bypass. - Trend hemoglobin. - Maintain OG.  Acute metabolic encephalopathy: - Currently sedated.  Neurology following. - MRI motion degraded but negative. - EEG negative.  AKI on CKD: - Baseline 1.9-2.2. -Monitor.  Minimally elevated postcardiac arrest. -Was being given IV fluids before. -Hold diuresis for now. - Replete potassium   Failure to thrive: - Nutrition consult. Will consider TPN tomorrow if not doing better on SBTs   Possible seizures: -EEG negative for seizures. - Neurology following.  Parotitis: - Was started on Unasyn and Zyvox by hospitalist service. - Continue for now. Plan for 5 days total, can stop tomorrow morning   Peripheral arterial disease: - Status post femorofemoral bypass - 04/17/2024--angioplasty right common iliac and external iliac arteries--Dr. Gretta - Continue aspirin , Plavix , Pletal    C6 spinous process fracture -per neurosurgery--continue operative treatment; can use cervical collar if significant pain  Hypertension: Hyperlipidemia: -Will resume meds as appropriate.  DM 2: - SSI.  Constipation Start docusate supp    Labs   CBC: Recent Labs  Lab 05/13/24 1240 05/14/24 0411  05/17/24 2320 05/18/24 0018 05/18/24 1253 05/18/24 1902 05/19/24 0930 05/20/24 0541  WBC 16.7*   < > 20.0*  --  15.3* 12.8* 10.1 10.6*  NEUTROABS 13.5*  --   --   --   --   --   --   --   HGB 9.1*   < > 8.0* 7.5* 6.2* 7.0* 7.8* 7.4*  HCT 29.2*   < > 27.1* 22.0* 20.4* 22.6* 25.1* 23.8*  MCV 79.8*   < > 82.4  --  82.6 83.4 83.9 84.7  PLT 544*   < > 423*  --  333 275 263 239   < > = values in this interval not displayed.    Basic Metabolic Panel: Recent Labs  Lab 05/13/24 1250 05/14/24 0411 05/15/24 0414 05/16/24 0414 05/17/24 0517 05/17/24 2320 05/18/24 0018 05/19/24 0930 05/20/24 0541  NA  --    < > 137 139 140 140 140 139 141  K  --    < > 4.3 4.0 3.9 4.1 4.2 3.6 3.3*  CL  --    < > 104 106 105 109  --  108 109  CO2  --    < > 18* 18* 16* 13*  --  20* 20*  GLUCOSE  --    < >  77 72 72 143*  --  109* 81  BUN  --    < > 49* 41* 36* 37*  --  40* 36*  CREATININE  --    < > 3.32* 2.79* 2.59* 2.65*  --  2.81* 2.78*  CALCIUM   --    < > 9.5 9.6 9.5 8.8*  --  8.0* 8.2*  MG 2.2  --  2.3 2.2 2.0 2.0  --   --   --    < > = values in this interval not displayed.   GFR: Estimated Creatinine Clearance: 20.6 mL/min (A) (by C-G formula based on SCr of 2.78 mg/dL (H)). Recent Labs  Lab 05/13/24 1319 05/13/24 1441 05/14/24 0411 05/14/24 0840 05/15/24 0414 05/17/24 2320 05/18/24 0243 05/18/24 1253 05/18/24 1902 05/19/24 0930 05/20/24 0541  PROCALCITON  --   --   --  0.40  --   --   --   --   --   --   --   WBC  --   --    < >  --    < > 20.0*  --  15.3* 12.8* 10.1 10.6*  LATICACIDVEN 2.6* 1.6  --   --   --  2.8* 1.1  --   --   --   --    < > = values in this interval not displayed.    Liver Function Tests: Recent Labs  Lab 05/13/24 1240 05/17/24 0517 05/17/24 2320 05/19/24 0930 05/20/24 0925  AST 29 23 475* 132* 68*  ALT 10 8 264* 159* 121*  ALKPHOS 138* 122 102 85 95  BILITOT 0.4 0.4 1.0 0.7 0.8  PROT 7.8 7.1 6.4* 5.6* 6.4*  ALBUMIN  3.7 3.4* 2.5* 2.2* 2.4*   No  results for input(s): LIPASE, AMYLASE in the last 168 hours. Recent Labs  Lab 05/13/24 1319 05/17/24 0517  AMMONIA <13 <13    ABG    Component Value Date/Time   PHART 7.356 05/18/2024 0018   PCO2ART 26.3 (L) 05/18/2024 0018   PO2ART 72 (L) 05/18/2024 0018   HCO3 15.0 (L) 05/18/2024 0018   TCO2 16 (L) 05/18/2024 0018   ACIDBASEDEF 10.0 (H) 05/18/2024 0018   O2SAT 95 05/18/2024 0018      Past Medical History:  He,  has a past medical history of Anemia, CAD S/P percutaneous coronary angioplasty (1998), CKD (chronic kidney disease) stage 3, GFR 30-59 ml/min (HCC), Diabetes mellitus without complication (HCC), Diverticulitis (08/16/2017), GERD (gastroesophageal reflux disease), CABG (10/2015), Hypercholesteremia, Hypertension, Neuropathy, Peripheral vascular disease, Pneumonia, and Tachycardia.   Surgical History:   Past Surgical History:  Procedure Laterality Date   ABDOMINAL AORTOGRAM Bilateral 09/19/2016   Procedure: iliac;  Surgeon: Gaile LELON New, MD;  [facility]: [facility] INVAS[facility] CV LAB;  Service: Cardiovascular;  Laterality: Bilateral;   ABDOMINAL AORTOGRAM N/A 04/17/2024   Procedure: ABDOMINAL AORTOGRAM;  Surgeon: Gretta Lonni PARAS, MD;  Location: [facility] INVASIVE CV LAB;  Service: Cardiovascular;  Laterality: N/A;   ABDOMINAL AORTOGRAM W/LOWER EXTREMITY N/A 09/22/2021   Procedure: ABDOMINAL AORTOGRAM W/LOWER EXTREMITY;  Surgeon: Gretta Lonni PARAS, MD;  Location: [facility];  Service: Cardiovascular;  Laterality: N/A;   ABDOMINAL AORTOGRAM W/LOWER EXTREMITY N/A 10/05/2022   Procedure: ABDOMINAL AORTOGRAM W/LOWER EXTREMITY;  Surgeon: Gretta Lonni PARAS, MD;  Location: [facility] CV LAB;  Service: Cardiovascular;  Laterality: N/A;   BIOPSY  05/16/2023   Procedure: BIOPSY;  Surgeon: Shaaron Lamar HERO, MD;  Location: [facility];  Service: Endoscopy;;   CARDIAC CATHETERIZATION  2003   with stent   CARDIAC CATHETERIZATION N/A 10/11/2015   Procedure: Left Heart Cath and  Coronary Angiography;  Surgeon: Peter M Swaziland, MD;  Location: [facility] INVASIVE CV LAB;  Service: Cardiovascular;  Laterality: N/A;   COLON RESECTION N/A 12/13/2017   Procedure: EXPLORATORY LAPAROTOMY, SIGMOID COLECTOMY WITH COLOSTOMY;  Surgeon: Belinda Cough, MD;  Location: [facility] OR;  Service: General;  Laterality: N/A;   COLONOSCOPY N/A 09/22/2013   Procedure: COLONOSCOPY;  Surgeon: Lamar CHRISTELLA Hollingshead, MD;  Location: [facility];  Service: Endoscopy;  Laterality: N/A;  9:30 AM   COLONOSCOPY N/A 02/19/2018   Procedure: COLONOSCOPY;  Surgeon: Hollingshead Lamar CHRISTELLA, MD;  [facility];  Service: Endoscopy;  Laterality: N/A;  2:00pm   COLONOSCOPY WITH PROPOFOL  N/A 05/16/2023   Procedure: COLONOSCOPY WITH PROPOFOL ;  Surgeon: Hollingshead Lamar CHRISTELLA, MD;  [facility]: [facility];  Service: Endoscopy;  Laterality: N/A;  730am, asa 3   COLOSTOMY N/A 12/13/2017   Procedure: COLOSTOMY;  Surgeon: Belinda Cough, MD;  Location[facility] OR;  Service: General;  Laterality: N/A;   COLOSTOMY REVERSAL N/A 03/26/2018   Procedure: COLOSTOMY REVERSAL;  Surgeon: Belinda Cough, MD;  Location: [facility] OR;  Service: General;  Laterality: N/A;   CORONARY ARTERY BYPASS GRAFT N/A 10/18/2015   Procedure: CORONARY ARTERY BYPASS GRAFTING (CABG) x  five, using left internal mammary artery and right leg greater saphenous vein harvested endoscopically;  Surgeon: Elspeth JAYSON Millers, MD;  Location: [facility] OR;  Service: Open Heart Surgery;  Laterality: N/A;   ENDARTERECTOMY FEMORAL Right 08/09/2016   Procedure: ENDARTERECTOMY FEMORAL WITH VEIN PATCH ANGIOPLASTY;  Surgeon: Gaile LELON New, MD;  Location: [facility] OR;  Service: Vascular;  Laterality: Right;   ESOPHAGOGASTRODUODENOSCOPY N/A 10/24/2017   Dr. Hollingshead: hiatal hernia   ESOPHAGOGASTRODUODENOSCOPY N/A 01/01/2024   Procedure: EGD (ESOPHAGOGASTRODUODENOSCOPY);  Surgeon: Cinderella Deatrice FALCON, MD;  [facility];  Service: Endoscopy;  Laterality: N/A;   ESOPHAGOGASTRODUODENOSCOPY N/A 05/19/2024    Procedure: EGD (ESOPHAGOGASTRODUODENOSCOPY);  Surgeon: Leigh Elspeth SQUIBB, MD;  Location: [facility] ENDOSCOPY;  Service: Gastroenterology;  Laterality: N/A;   ESOPHAGOGASTRODUODENOSCOPY (EGD) WITH PROPOFOL  N/A 05/16/2023   Procedure: ESOPHAGOGASTRODUODENOSCOPY (EGD) WITH PROPOFOL ;  Surgeon: Hollingshead Lamar CHRISTELLA, MD;  [facility];  Service: Endoscopy;  Laterality: N/A;   FEMORAL-FEMORAL BYPASS GRAFT Bilateral 08/09/2016   Procedure: REVISION BYPASS GRAFT RIGHT FEMORAL-LEFT FEMORAL ARTERY;  Surgeon: Gaile LELON New, MD;  Location: [facility] OR;  Service: Vascular;  Laterality: Bilateral;   FEMORAL-POPLITEAL BYPASS GRAFT     INTRAMEDULLARY (IM) NAIL INTERTROCHANTERIC Left 04/03/2021   Procedure: LEFT  HIP INTRAMEDULLARY (IM) NAIL INTERTROCHANTRIC;  Surgeon: Vernetta Lonni GRADE, MD;  Location: [facility] OR;  Service: Orthopedics;  Laterality: Left;   IR FLUORO GUIDED NEEDLE PLC ASPIRATION/INJECTION LOC  12/26/2023   IR IVC FILTER RETRIEVAL / S&I /IMG GUID/MOD SED  01/15/2024   LOWER EXTREMITY ANGIOGRAPHY N/A 04/17/2024   Procedure: Lower Extremity Angiography;  Surgeon: Gretta Lonni PARAS, MD;  Location: [facility] INVASIVE CV LAB;  Service: Cardiovascular;  Laterality: N/A;   LOWER EXTREMITY INTERVENTION N/A 04/17/2024   Procedure: LOWER EXTREMITY INTERVENTION;  Surgeon: Gretta Lonni PARAS, MD;  [facility];  Service: Cardiovascular;  Laterality: N/A;   MALONEY DILATION N/A 05/16/2023   Procedure: AGAPITO DILATION;  Surgeon: Hollingshead Lamar CHRISTELLA, MD;  [facility];  Service: Endoscopy;  Laterality: N/A;   PERIPHERAL VASCULAR BALLOON ANGIOPLASTY Right 09/22/2021   Procedure: PERIPHERAL VASCULAR BALLOON ANGIOPLASTY;  Surgeon: Gretta Lonni PARAS, MD;  Location: [facility]  INVASIVE CV LAB;  Service: Cardiovascular;  Laterality: Right;   PERIPHERAL VASCULAR CATHETERIZATION N/A 05/08/2016   Procedure: Lower Extremity Angiography;  Surgeon: Dorn JINNY Lesches, MD;  Location: [facility] INVASIVE CV LAB;  Service:  Cardiovascular;  Laterality: N/A;   PERIPHERAL VASCULAR INTERVENTION Right 09/19/2016   Procedure: Peripheral Vascular Intervention;  Surgeon: Gaile LELON New, MD;  [facility]: [facility] CV LAB;  Service: Cardiovascular;  Laterality: Right;  ext iliac stent   PERIPHERAL VASCULAR INTERVENTION Right 09/22/2021   Procedure: PERIPHERAL VASCULAR INTERVENTION;  Surgeon: Gretta Lonni JINNY, MD;  [facility]: [facility] CV LAB;  Service: Cardiovascular;  Laterality: Right;   POLYPECTOMY  02/19/2018   Procedure: POLYPECTOMY;  Surgeon: Shaaron Lamar HERO, MD;  [facility];  Service: Endoscopy;;   PROCTOSCOPY  10/24/2017   Procedure: PROCTOSCOPY;  Surgeon: Shaaron Lamar HERO, MD;  [facility];  Service: Endoscopy;;   TEE WITHOUT CARDIOVERSION N/A 10/18/2015   Procedure: TRANSESOPHAGEAL ECHOCARDIOGRAM (TEE);  Surgeon: Elspeth JAYSON Millers, MD;  Location: [facility] OR;  Service: Open Heart Surgery;  Laterality: N/A;     Social History:   reports that he quit smoking about 23 years ago. His smoking use included cigarettes. He started smoking about 53 years ago. He has a 60 pack-year smoking history. He has never used smokeless tobacco. He reports that he does not drink alcohol  and does not use drugs.   Family History:  His family history includes Colon cancer in his brother; Heart attack in his mother; Stroke in his mother.   Allergies Allergies  Allergen Reactions   Baclofen Other (See Comments)    Altered mental status, extreme drowsiness   Acarbose Diarrhea     Home Medications  Prior to Admission medications   Medication Sig Start Date End Date Taking? Authorizing Provider  aspirin  EC 81 MG tablet Take 1 tablet (81 mg total) by mouth daily with breakfast. 05/23/19  Yes Emokpae, Courage, MD  atorvastatin  (LIPITOR ) 40 MG tablet Take 40 mg by mouth at bedtime. 02/07/24  Yes [provider]  cilostazol  (PLETAL ) 100 MG tablet Take 100 mg by mouth 2 (two) times daily. 02/07/24  Yes  [provider]  clopidogrel  (PLAVIX ) 75 MG tablet Take 1 tablet (75 mg total) by mouth in the morning. 04/17/24  Yes Gretta Lonni JINNY, MD  insulin  glargine (LANTUS ) 100 UNIT/ML injection Inject 0.1 mLs (10 Units total) into the skin at bedtime. Patient taking differently: Inject 24 Units into the skin at bedtime. 12/11/23  Yes Johnson, Clanford L, MD  lidocaine  (LIDODERM ) 5 % Place 1 patch onto the skin daily. 05/05/24  Yes [provider]  magnesium  oxide (MAG-OX) 400 MG tablet Take 400 mg by mouth 2 (two) times daily. 12/04/23  Yes [provider]  metoprolol  succinate (TOPROL -XL) 50 MG 24 hr tablet Take 0.5 tablets (25 mg total) by mouth daily. 03/23/24  Yes Emokpae, Courage, MD  vitamin B-12 (CYANOCOBALAMIN ) 1000 MCG tablet Take 1,000 mcg by mouth in the morning.   Yes [provider]  amoxicillin -clavulanate (AUGMENTIN ) 875-125 MG tablet Take 1 tablet by mouth 2 (two) times daily. Patient not taking: Reported on 05/13/2024 05/05/24   [provider]  doxycycline  (VIBRA -TABS) 100 MG tablet Take 100 mg by mouth 2 (two) times daily. Patient not taking: Reported on 05/13/2024 05/05/24   [provider]  pantoprazole  (PROTONIX ) 40 MG tablet Take 1 tablet (40 mg total) by mouth daily. 03/23/24   Pearlean Manus, MD  pregabalin  (LYRICA ) 75 MG capsule Take  75 mg by mouth 2 (two) times daily. Patient not taking: Reported on 05/13/2024 01/22/24   [provider]     The patient is critically ill due to acute hypoxic respiratory failure requiring mechanical ventilation.  Critical care was necessary to treat or prevent imminent or life-threatening deterioration. Critical care time was spent by me on the following activities: development of a treatment plan with the patient and/or surrogate as well as nursing, discussions with consultants, evaluation of the patient's response to treatment, examination of the patient, obtaining a history from the  patient or surrogate, ordering and performing treatments and interventions, ordering and review of laboratory studies, ordering and review of radiographic studies, review of telemetry data including pulse oximetry, re-evaluation of patient's condition and participation in multidisciplinary rounds.   I personally spent 41  minutes providing critical care not including any separately billable procedures.   Zola LOISE Herter, MD [facility] 05/20/2024 10:40 AM    05/20/2024, 10:40 AM

## 2024-05-20 NOTE — Progress Notes (Addendum)
 Progress Note   Subjective  No bleeding symptoms per nursing overnight. He does have a small abrasion on the lower front lip which has had some oozing. Hgb and BUN stable.    Objective   Vital signs in last 24 hours: Temp:  [96.3 F (35.7 C)-99.1 F (37.3 C)] 97.7 F (36.5 C) (10/07 0645) Pulse Rate:  [57-75] 61 (10/07 0645) Resp:  [15-21] 17 (10/07 0645) BP: (118-153)/(42-81) 133/51 (10/07 0645) SpO2:  [98 %-100 %] 100 % (10/07 0645) FiO2 (%):  [40 %] 40 % (10/07 0309) Weight:  [64.3 kg] 64.3 kg (10/07 0600) Last BM Date : 05/14/24 General:    white male intubated, small abrasion on lower front mid lip  Intake/Output from previous day: 10/06 0701 - 10/07 0700 In: 1109.5 [I.V.:554.4; IV Piggyback:555.1] Out: 1035 [Urine:1035] Intake/Output this shift: No intake/output data recorded.  Lab Results: Recent Labs    05/18/24 1902 05/19/24 0930 05/20/24 0541  WBC 12.8* 10.1 10.6*  HGB 7.0* 7.8* 7.4*  HCT 22.6* 25.1* 23.8*  PLT 275 263 239   BMET Recent Labs    05/17/24 2320 05/18/24 0018 05/19/24 0930 05/20/24 0541  NA 140 140 139 141  K 4.1 4.2 3.6 3.3*  CL 109  --  108 109  CO2 13*  --  20* 20*  GLUCOSE 143*  --  109* 81  BUN 37*  --  40* 36*  CREATININE 2.65*  --  2.81* 2.78*  CALCIUM  8.8*  --  8.0* 8.2*   LFT Recent Labs    05/19/24 0930  PROT 5.6*  ALBUMIN  2.2*  AST 132*  ALT 159*  ALKPHOS 85  BILITOT 0.7   PT/INR Recent Labs    05/18/24 1253 05/19/24 0930  LABPROT 17.9* 17.5*  INR 1.4* 1.4*    Studies/Results:     Assessment / Plan:    78 y/o male here with the following:   Hemorrhagic esophagitis - upper GI bleed Transaminitis likely secondary to shock liver S/p cardiac arrest secondary to hypoxia CKD COPD History of CABG - on Plavix , echo shows normal EF   EGD done yesterday - severe distal esophagitis with stigmata of recent bleeding / adherent clot. 4 hemostasis clips placed. He has had esophagitis noted on prior  exams over the past year, has not healed well despite protonix  40mg  / day and pepcid  q HS.   No further bleeding. Primary team to attempt to wean from the vent, once he does this and awake / extubated, can start clear liquid diet. Family concerned about malnourishment. If thought to be an extended course on the ventilator, would start TPN. Would AVOID placement of OG/NG tube given severe esophagitis with recent endoscopic intervention.   In regards to question of when to resume plavix , would hold for another few days to make sure no recurrent bleeding. If he continues to do well in that regard, could resume it in 3 days or so.   Moving forward, once out of the hospital, recommend treating esophagitis with Voquezna 20mg  / day for 8 weeks, followed by 10mg  / day thereafter. I have asked pharmacy to work on prior authorization for that. Once outpatient, he can follow up with his primary GI MD ([facility]) for further management and consider repeat EGD to assess for mucosal healing, pending his course.   Otherwise, LAEs had downtrended, likely shock liver in the setting of cardiac arrest. Would trend to normalization. Avoid hypotension.  RECOMMEND: - AVOID NG/OG placement - if  can wean off vent, can then start clear liquids and advance slowly as tolerated. Can start liquid carafate TID once tolerating PO - if he will remain on vent for some time, would start TPN for nutrition.  - continue IV Protonix  40mg  BID while inpatient - transition to Voquena 20mg  / day for 8 weeks as outpatient, followed by 10mg  / day thereafter. I have asked pharmacy to work on prior authorization - if he is not able to get Voquezna as outpatient, would increase protonix  to 40mg  BID and add liquid carafate TID for 2 weeks - consider resuming Plavix  in 3 days or so, pending his course - can follow up with his primary GI MD ([facility]) upon discharge - trend LFTs, INR  Call with questions, we will sign off for  now.  Marcey Naval, MD [facility]

## 2024-05-20 NOTE — Progress Notes (Signed)
 Patient transported from 24m10 to CT and back on ventilator without complications.

## 2024-05-20 NOTE — Progress Notes (Signed)
 eLink Physician-Brief Progress Note Patient Name: [name] DOB: Nov 02, 1945 MRN: [id number]   Date of Service  05/20/2024  HPI/Events of Note  Ventilated with multiorgan dysfunction  eICU Interventions  Bilateral wrist restraints renewed for safety   0115 -patient had limited ventilation alarm and seem to have a cuff leak.  The tube was advanced to 24 cm but problems persisted so it was advanced 26 cm with resolution of cuff leak.  Will obtain chest radiograph to reassess positioning  0213 - CXR reviewed, ETT in appropriate position  0416 - initiate D10 infusion for hypoglycemia, NPO     [name] 05/20/2024, 10:08 PM

## 2024-05-21 ENCOUNTER — Inpatient Hospital Stay (HOSPITAL_COMMUNITY)

## 2024-05-21 ENCOUNTER — Other Ambulatory Visit: Payer: Self-pay

## 2024-05-21 ENCOUNTER — Inpatient Hospital Stay: Admitting: Oncology

## 2024-05-21 DIAGNOSIS — R570 Cardiogenic shock: Secondary | ICD-10-CM | POA: Diagnosis not present

## 2024-05-21 DIAGNOSIS — E43 Unspecified severe protein-calorie malnutrition: Secondary | ICD-10-CM | POA: Insufficient documentation

## 2024-05-21 DIAGNOSIS — J810 Acute pulmonary edema: Secondary | ICD-10-CM | POA: Diagnosis not present

## 2024-05-21 DIAGNOSIS — J9601 Acute respiratory failure with hypoxia: Secondary | ICD-10-CM | POA: Diagnosis not present

## 2024-05-21 DIAGNOSIS — G929 Unspecified toxic encephalopathy: Secondary | ICD-10-CM

## 2024-05-21 DIAGNOSIS — I469 Cardiac arrest, cause unspecified: Secondary | ICD-10-CM | POA: Diagnosis not present

## 2024-05-21 LAB — COMPREHENSIVE METABOLIC PANEL WITH GFR
ALT: 75 U/L — ABNORMAL HIGH (ref 0–44)
AST: 33 U/L (ref 15–41)
Albumin: 1.9 g/dL — ABNORMAL LOW (ref 3.5–5.0)
Alkaline Phosphatase: 74 U/L (ref 38–126)
Anion gap: 14 (ref 5–15)
BUN: 35 mg/dL — ABNORMAL HIGH (ref 8–23)
CO2: 16 mmol/L — ABNORMAL LOW (ref 22–32)
Calcium: 7.9 mg/dL — ABNORMAL LOW (ref 8.9–10.3)
Chloride: 110 mmol/L (ref 98–111)
Creatinine, Ser: 2.77 mg/dL — ABNORMAL HIGH (ref 0.61–1.24)
GFR, Estimated: 23 mL/min — ABNORMAL LOW (ref >60)
Glucose, Bld: 75 mg/dL (ref 70–99)
Potassium: 3.8 mmol/L (ref 3.5–5.1)
Sodium: 140 mmol/L (ref 135–145)
Total Bilirubin: 1 mg/dL (ref 0.0–1.2)
Total Protein: 5.2 g/dL — ABNORMAL LOW (ref 6.5–8.1)

## 2024-05-21 LAB — VITAMIN B1: Vitamin B1 (Thiamine): 93.3 nmol/L (ref 66.5–200.0)

## 2024-05-21 LAB — GLUCOSE, CAPILLARY
Glucose-Capillary: 127 mg/dL — ABNORMAL HIGH (ref 70–99)
Glucose-Capillary: 67 mg/dL — ABNORMAL LOW (ref 70–99)
Glucose-Capillary: 91 mg/dL (ref 70–99)
Glucose-Capillary: 128 mg/dL — ABNORMAL HIGH (ref 70–99)
Glucose-Capillary: 93 mg/dL (ref 70–99)
Glucose-Capillary: 132 mg/dL — ABNORMAL HIGH (ref 70–99)

## 2024-05-21 LAB — TRIGLYCERIDES
Triglycerides: 184 mg/dL — ABNORMAL HIGH (ref <150)
Triglycerides: 198 mg/dL — ABNORMAL HIGH (ref <150)

## 2024-05-21 LAB — MAGNESIUM: Magnesium: 1.9 mg/dL (ref 1.7–2.4)

## 2024-05-21 LAB — PHOSPHORUS: Phosphorus: 4.7 mg/dL — ABNORMAL HIGH (ref 2.5–4.6)

## 2024-05-21 MED ORDER — MIDAZOLAM HCL 2 MG/2ML IJ SOLN: 2.0000 mg | Freq: Once | INTRAMUSCULAR | Status: DC | PRN

## 2024-05-21 MED ORDER — DEXTROSE 50 % IV SOLN
12.5000 g | INTRAVENOUS | Status: AC
  Administered 2024-05-21: 12.5 g via INTRAVENOUS
  Filled 2024-05-21: qty 50

## 2024-05-21 MED ORDER — DEXMEDETOMIDINE HCL IN NACL 400 MCG/100ML IV SOLN
0.0000 ug/kg/h | INTRAVENOUS | Status: DC
  Administered 2024-05-21 (×2): 0.4 ug/kg/h via INTRAVENOUS
  Administered 2024-05-22: 0.7 ug/kg/h via INTRAVENOUS
  Administered 2024-05-22: 0.064 ug/kg/h via INTRAVENOUS
  Administered 2024-05-23: 0.8 ug/kg/h via INTRAVENOUS
  Administered 2024-05-23: 0.4 ug/kg/h via INTRAVENOUS
  Administered 2024-05-24: 0.9 ug/kg/h via INTRAVENOUS
  Filled 2024-05-21 (×8): qty 100

## 2024-05-21 MED ORDER — TRACE MINERALS CU-MN-SE-ZN 300-55-60-3000 MCG/ML IV SOLN
INTRAVENOUS | Status: AC
  Filled 2024-05-21: qty 376.33

## 2024-05-21 MED ORDER — DEXTROSE 10 % IV SOLN: INTRAVENOUS | Status: AC

## 2024-05-21 MED ORDER — SODIUM CHLORIDE 0.9% FLUSH: 10.0000 mL | INTRAVENOUS | Status: DC | PRN

## 2024-05-21 NOTE — Progress Notes (Signed)
 Peripherally Inserted Central Catheter Placement  The IV Nurse has discussed with the patient and/or persons authorized to consent for the patient, the purpose of this procedure and the potential benefits and risks involved with this procedure.  The benefits include less needle sticks, lab draws from the catheter, and the patient may be discharged home with the catheter. Risks include, but not limited to, infection, bleeding, blood clot (thrombus formation), and puncture of an artery; nerve damage and irregular heartbeat and possibility to perform a PICC exchange if needed/ordered by physician.  Alternatives to this procedure were also discussed.  Bard Power PICC patient education guide, fact sheet on infection prevention and patient information card has been provided to patient /or left at bedside.    PICC Placement Documentation  PICC Triple Lumen 05/21/24 Right Basilic 38 cm 2 cm (Active)  Indication for Insertion or Continuance of Line Administration of hyperosmolar/irritating solutions (i.e. TPN, Vancomycin , etc.) 05/21/24 1658  Exposed Catheter (cm) 2 cm 05/21/24 1658  Site Assessment Clean, Dry, Intact 05/21/24 1658  Lumen #1 Status Flushed;Saline locked;Blood return noted 05/21/24 1658  Lumen #2 Status Flushed;Saline locked;Blood return noted 05/21/24 1658  Lumen #3 Status Flushed;Saline locked;Blood return noted 05/21/24 1658  Dressing Type Transparent;Securing device 05/21/24 1658  Dressing Status Antimicrobial disc/dressing in place;Clean, Dry, Intact 05/21/24 1658  Line Care Connections checked and tightened 05/21/24 1658  Line Adjustment (NICU/IV Team Only) No 05/21/24 1658  Dressing Intervention New dressing;Adhesive placed at insertion site (IV team only) 05/21/24 1658  Dressing Change Due 05/28/24 05/21/24 1658    Patient's wife, Boruch Manuele, signed PICC consent at bedside prior to insertion.   Jolee Na 05/21/2024, 5:01 PM

## 2024-05-21 NOTE — Progress Notes (Signed)
 RT NOTE: patient placed on CPAP/PSV of 10/5 at 0750.  Tolerating well at this time.  Will continue to monitor and wean as tolerated.

## 2024-05-21 NOTE — Progress Notes (Signed)
 RT NOTE: patient PSV weaned to 5 and is currently on CPAP/PSV of 5/5.  Tolerating well at this time.  Will continue to monitor.

## 2024-05-21 NOTE — Progress Notes (Signed)
 Nutrition Follow-up  DOCUMENTATION CODES:  Severe malnutrition in context of chronic illness  INTERVENTION:  Per GI, pt should not have a tube placed in esophagus for several weeks. Also recommends on holding on solid food for awhile but if able to be extubated and tolerates clear liquids could be advanced to full liquids.  TPN to be initiated today as pt severely malnourished and with no ability to feed enterally at this time.  Management per pharmacy team Pt will be at risk for refeeding, recommend thiamine x 5 days while TPN is being titrated to goal.  Nutrition recommendations are found below Monitor GOC discussion, PMT following to help family with GOC discussions and establishing pt's wishes  NUTRITION DIAGNOSIS:  Severe Malnutrition related to chronic illness (COPD) as evidenced by severe fat depletion, severe muscle depletion. - remains applicable  GOAL:  Patient will meet greater than or equal to 90% of their needs - progressing, TPN to be initiated  MONITOR:  Vent status, I & O's, Labs  REASON FOR ASSESSMENT:  Consult New TPN/TNA  ASSESSMENT:  Pt with history of coronary artery disease status post CABG, diabetes mellitus type 2, hypertension, hyperlipidemia, CKD stage IV, COPD, peripheral arterial disease, atrial tachycardia, diverticulitis status post colostomy and subsequent takedown, TIA presenting with generalized weakness, confusion, decreased oral intake, and difficulty ambulating.  9/30 - presented to [facility] 10/4 - transferred to [facility] 10M and then later in the day had PEA cardiac arrest and transferred to 85M, intubated 10/6 - EGD planned after pt with drop in Hgb and hx of esophagitis. Found to have severe esophagitis with stigmata of recent bleeding. 4 clips were placed   10/8 - TPN to be initiated this afternoon, pt planning to have tunneled line placed in IR for access as TPN will likely be needed for sometime.  Pt resting in bed at the time of  assessment remains intubated. RN and nursing student in room at the time of assessment, no family present. Pt discussed during ICU rounds and with RN and MD. Pt unable to wean from ventilator today. Current plan is for pt to have tunneled central line placed in IR this afternoon for access for TPN as it is anticipated that pt is going to require TPN for sometime. If/when pt is able to be extubated, GI service does not recommend the initiation of solid food for awhile due to the severity of esophagitis and the need for clipping.   Current plan is for line placement and initiation of TPN tonight. Will continue to monitor and support pt's nutrition status. Pt will be at risk for refeeding syndrome with the initiation of nutrition due to his degree of malnutrition and prolonged inadequate intake.   MV: 12.2 L/min Temp (24hrs), Avg:98 F (36.7 C), Min:97.2 F (36.2 C), Max:98.6 F (37 C) MAP (cuff): 70-82 mmHg this AM  Admit weight: 59 kg   Current weight: 62.2 kg    Intake/Output Summary (Last 24 hours) at 05/21/2024 1218 Last data filed at 05/21/2024 0700 Gross per 24 hour  Intake 1298.69 ml  Output 755 ml  Net 543.69 ml  Net IO Since Admission: 7,142.29 mL [05/21/24 1218]  Drains/Lines: UOP x 24 hours  Average Meal Intake: 10/3-10/4: 3% intake x 3 recorded meals  Nutritionally Relevant Medications: Scheduled Meds:  insulin  aspart  0-9 Units Subcutaneous Q4H   pantoprazole  IV  40 mg Intravenous Q12H   Continuous Infusions:  dextrose  10 mL/hr at 05/21/24 0700   thiamine (VITAMIN  B1) injection 250 mg (05/21/24 0922)   TPN ADULT (ION)     PRN Meds: prochlorperazine  Labs Reviewed: BUN 35, creatinine 2.77 Phosphorus 4.7 CBG ranges from 67-128 mg/dL over the last 24 hours HgbA1c 9.0% (9/30)  NUTRITION - FOCUSED PHYSICAL EXAM: Flowsheet Row Most Recent Value  Orbital Region Moderate depletion  Upper Arm Region Severe depletion  Thoracic and Lumbar Region Severe  depletion  Buccal Region Mild depletion  Temple Region Moderate depletion  Clavicle Bone Region Mild depletion  Clavicle and Acromion Bone Region Severe depletion  Scapular Bone Region Severe depletion  Dorsal Hand No depletion  [edema]  Patellar Region Severe depletion  Anterior Thigh Region Severe depletion  Posterior Calf Region Severe depletion  Edema (RD Assessment) Mild  [hands, lower arms]  Hair Reviewed  Eyes Reviewed  Mouth Reviewed  Skin Reviewed  Nails Reviewed    Diet Order:   Diet Order             Diet NPO time specified  Diet effective now                   EDUCATION NEEDS:  Not appropriate for education at this time  Skin:  Skin Assessment: Reviewed RN Assessment  Last BM:  10/8 - type 6  Height:  Ht Readings from Last 1 Encounters:  05/21/24 5' 7 (1.702 m)    Weight:  Wt Readings from Last 1 Encounters:  05/21/24 62.2 kg    Ideal Body Weight:  67.3 kg  BMI:  Body mass index is 21.48 kg/m.  Estimated Nutritional Needs:  Kcal:  1800-2000 kcal/d Protein:  90-105 grams Fluid:  1.8-2L/d    Vernell Lukes, RD, LDN, CNSC Registered Dietitian II Please reach out via secure chat

## 2024-05-21 NOTE — Progress Notes (Signed)
 NAME:  [name], MRN:  [id number], DOB:  08/10/1946, LOS: 8 ADMISSION DATE:  05/13/2024 CHIEF COMPLAINT:  Arrest.    History of Present Illness:  78 y/o male with PMH for CAD status post CABG, DM type 2, HTN, hyperlipidemia, CKD stage IV, COPD, PAD, atrial tachycardia, diverticulitis status post colostomy and subsequent takedown, TIA who presented initially from [facility] with generalized weakness, confusion, difficulty ambulating and decreased oral intake.  He has had several episodes of pneumonia and has been deteriorating since May 2025.  He was treated for CAP in August at which time he also was noted to have acute on chronic renal failure.  CT head negative on admission.  Her also had episodic delirium.   Review of the medical record shows the patient has had enhancing lesions in his lumbar and thoracic spine on MRI (12/07/23).  PET scan (12/27/23) also showed Multifocal mild areas of abnormal uptake along the skeleton including several ribs, vertebral bodies and scattered along the pelvis. Some of these lesions are associated with areas of bony sclerosis.  He had separate T9 and L3 bone biopsies without any metastatic carcinoma identified.  He has seen medical oncology and has undergone extensive workup including serum immunoglobulins and SPEP which have been negative.  In addition, tumor markers CEA, CA 19-9, alpha-fetoprotein, and PSA were all unremarkable. During this hospitalization, wife stated she has noticed some shaking in his right arm and right chest intermittently.    This evening he was on and he c/o back pain, took his O2 off and then went into pulseless V tach and then PEA.  Code blue called and CPR started.  Initially lost his pulse and then regained only to lose pulse again then regained.  He was subsequently transferred to 2M11.  Pertinent  Medical History   CAD status post CABG, DM type 2, HTN, hyperlipidemia, CKD stage IV, COPD, PAD, atrial tachycardia, diverticulitis status  post colostomy and subsequent takedown, TIA   Interim History / Subjective:  Currently sedated and intubated.  Opening eyes while on sedation.  Seem to recognize his wife's voice.  Significant Hospital Events: 10/4 transfer to ICU 10/4 Code blue  Objective    Blood pressure (!) 130/45, pulse 89, temperature 98.4 F (36.9 C), resp. rate 17, height 5' 7 (1.702 m), weight 62.2 kg, SpO2 96%.    Vent Mode: PSV;CPAP FiO2 (%):  [40 %] 40 % Set Rate:  [17 bmp] 17 bmp Vt Set:  [530 mL] 530 mL PEEP:  [5 cmH20] 5 cmH20 Pressure Support:  [5 cmH20-10 cmH20] 5 cmH20 Plateau Pressure:  [16 cmH20-19 cmH20] 16 cmH20   Intake/Output Summary (Last 24 hours) at 05/21/2024 0954 Last data filed at 05/21/2024 0700 Gross per 24 hour  Intake 1578.6 ml  Output 755 ml  Net 823.6 ml   Filed Weights   05/19/24 0600 05/20/24 0600 05/21/24 0405  Weight: 62.9 kg 64.3 kg 62.2 kg    Examination: General: Sedated and intubated. Opens eyes, in mild distress  Lungs: clear to auscultation bilaterally.  Heart: regular rate rhythm, no murmur appreciated.  Abdomen: non tender, non distended. Normal BS.  Neuro: Sedated.  Opening eyes.  Pupils bilaterally equal.  Moving all 4 extremities. Does not follow commands    Assessment and Plan  Presented to the hospital on 9/30 for generalized weakness confusion, decreased oral intake and difficulty ambulating.  Patient was admitted to hospitalist service.  Hospital admission in August 2025 for CAP.  He was being treated  on the hospital floor for acute on chronic renal failure, metabolic encephalopathy and parotitis with antibiotics. Overnight on 10/4 had V. tach subsequently led to cardiac arrest, received 1 of epi, no shock.  During first pulse check was PEA arrest.   Goals of care discussion with family today  Discussed with family today, regarding the overall status of Mr Papadopoulos, he has had multiple admissions to the hospital this year, he suffered a cardiac arrest  during this hospital stay, he has had issues with GI bleeding, and now he has altered mentation with inability to extubate due his AMS. He can't tolerate tube feeds due to his recent GI bleed and clipping and will need TPN through a central line. Family would like to continue trying to safely extubate him at this time and they are ok with TPN through a central line. They expressed that they care about his overall quality of life and would not want him to have a trach if he needs it should he not be able to get off the mechanical ventilation. For now, we will continue with daily awakening and spontaneous breathing trials.    #Acute hypoxic respiratory failure, currently barrier to extubation is AMS  - Switch prop to dex  - Keep on fent Daily awakening trials and SBTs   #Toxic metabolic encphalopathy  Repeat Head CT negative Brain MRI on 10/3 motion degraded but no large abnormality noted   V. tach leading to cardiac arrest- ?  V. tach arrest versus PEA arrest: - Never received shock. - EKG postcardiac arrest with ST wave depression in lateral leads. - Troponin minimally elevated.  Will recheck.  BNP 1200. - Lactic acid 2.8> 1.1. - Echo: postcardiac arrest: EF normal with grade 1 diastolic dysfunction. - Will get lower extremity venous Dopplers. - Cardiology consulted.  Cardiogenic shock: - Postcardiac arrest. - off levophed  - Also contributed by sedation.  Monitor.   Pulmonary edema: Acute hypoxic respiratory failure: - Reportedly was hypoxic prior to cardiac arrest. - Vent bundle. - tolerated SBT today but barrier to extubation is mentation  - Hold further diuresis as below.  Shock liver (improving) : - Will trend LFTs.   CGE: Suspected UGIB: - IV PPI twice daily. - Hold heparin .  Continue aspirin  and Plavix  and monitor hemoglobin closely with recent femorofemoral bypass. - Trend hemoglobin. - Maintain OG.  Acute metabolic encephalopathy: - Currently sedated.  Neurology  following. - MRI motion degraded but negative. - EEG negative.  AKI on CKD: - Baseline 1.9-2.2. -Monitor.  Minimally elevated postcardiac arrest. -Was being given IV fluids before. -Hold diuresis for now. - Replete potassium   Failure to thrive: - Nutrition consult. Will consider TPN tomorrow if not doing better on SBTs   Possible seizures: -EEG negative for seizures. - Neurology following.  Parotitis: - Was started on Unasyn and Zyvox by hospitalist service. - Continue for now. Plan for 5 days total, can stop tomorrow morning   Peripheral arterial disease: - Status post femorofemoral bypass - 04/17/2024--angioplasty right common iliac and external iliac arteries--Dr. Gretta - Continue aspirin , Plavix , Pletal    C6 spinous process fracture -per neurosurgery--continue operative treatment; can use cervical collar if significant pain  Hypertension: Hyperlipidemia: -Will resume meds as appropriate.  DM 2: - SSI.  Constipation Start docusate supp  Moderate Malnutrition Patient with no oral intake for 8 days. Can't obtain enteral access with GI bleeds  Discussed with family and plan to place a PICC line and start TPN  Labs   CBC: Recent Labs  Lab 05/17/24 2320 05/18/24 0018 05/18/24 1253 05/18/24 1902 05/19/24 0930 05/20/24 0541  WBC 20.0*  --  15.3* 12.8* 10.1 10.6*  HGB 8.0* 7.5* 6.2* 7.0* 7.8* 7.4*  HCT 27.1* 22.0* 20.4* 22.6* 25.1* 23.8*  MCV 82.4  --  82.6 83.4 83.9 84.7  PLT 423*  --  333 275 263 239    Basic Metabolic Panel: Recent Labs  Lab 05/15/24 0414 05/16/24 0414 05/17/24 0517 05/17/24 2320 05/18/24 0018 05/19/24 0930 05/20/24 0541 05/21/24 0356  NA 137 139 140 140 140 139 141 140  K 4.3 4.0 3.9 4.1 4.2 3.6 3.3* 3.8  CL 104 106 105 109  --  108 109 110  CO2 18* 18* 16* 13*  --  20* 20* 16*  GLUCOSE 77 72 72 143*  --  109* 81 75  BUN 49* 41* 36* 37*  --  40* 36* 35*  CREATININE 3.32* 2.79* 2.59* 2.65*  --  2.81* 2.78* 2.77*   CALCIUM  9.5 9.6 9.5 8.8*  --  8.0* 8.2* 7.9*  MG 2.3 2.2 2.0 2.0  --   --   --  1.9  PHOS  --   --   --   --   --   --   --  4.7*   GFR: Estimated Creatinine Clearance: 20 mL/min (A) (by C-G formula based on SCr of 2.77 mg/dL (H)). Recent Labs  Lab 05/17/24 2320 05/18/24 0243 05/18/24 1253 05/18/24 1902 05/19/24 0930 05/20/24 0541  WBC 20.0*  --  15.3* 12.8* 10.1 10.6*  LATICACIDVEN 2.8* 1.1  --   --   --   --     Liver Function Tests: Recent Labs  Lab 05/17/24 0517 05/17/24 2320 05/19/24 0930 05/20/24 0925 05/21/24 0356  AST 23 475* 132* 68* 33  ALT 8 264* 159* 121* 75*  ALKPHOS 122 102 85 95 74  BILITOT 0.4 1.0 0.7 0.8 1.0  PROT 7.1 6.4* 5.6* 6.4* 5.2*  ALBUMIN  3.4* 2.5* 2.2* 2.4* 1.9*   No results for input(s): LIPASE, AMYLASE in the last 168 hours. Recent Labs  Lab 05/17/24 0517  AMMONIA <13    ABG    Component Value Date/Time   PHART 7.356 05/18/2024 0018   PCO2ART 26.3 (L) 05/18/2024 0018   PO2ART 72 (L) 05/18/2024 0018   HCO3 15.0 (L) 05/18/2024 0018   TCO2 16 (L) 05/18/2024 0018   ACIDBASEDEF 10.0 (H) 05/18/2024 0018   O2SAT 95 05/18/2024 0018      Past Medical History:  He,  has a past medical history of Anemia, CAD S/P percutaneous coronary angioplasty (1998), CKD (chronic kidney disease) stage 3, GFR 30-59 ml/min (HCC), Diabetes mellitus without complication (HCC), Diverticulitis (08/16/2017), GERD (gastroesophageal reflux disease), CABG (10/2015), Hypercholesteremia, Hypertension, Neuropathy, Peripheral vascular disease, Pneumonia, and Tachycardia.   Surgical History:   Past Surgical History:  Procedure Laterality Date   ABDOMINAL AORTOGRAM Bilateral 09/19/2016   Procedure: iliac;  Surgeon: Gaile LELON New, MD;  [facility]: [facility] INVAS[facility] CV LAB;  Service: Cardiovascular;  Laterality: Bilateral;   ABDOMINAL AORTOGRAM N/A 04/17/2024   Procedure: ABDOMINAL AORTOGRAM;  Surgeon: Gretta Lonni PARAS, MD;  Location: [facility] INVASIVE CV LAB;  Service:  Cardiovascular;  Laterality: N/A;   ABDOMINAL AORTOGRAM W/LOWER EXTREMITY N/A 09/22/2021   Procedure: ABDOMINAL AORTOGRAM W/LOWER EXTREMITY;  Surgeon: Gretta Lonni PARAS, MD;  Location: [facility] CV LAB;  Service: Cardiovascular;  Laterality: N/A;   ABDOMINAL AORTOGRAM W/LOWER EXTREMITY N/A 10/05/2022   Procedure: ABDOMINAL AORTOGRAM  W/LOWER EXTREMITY;  Surgeon: Gretta Lonni PARAS, MD;  Location: [facility]VAS[facility] CV LAB;  Service: Cardiovascular;  Laterality: N/A;   BIOPSY  05/16/2023   Procedure: BIOPSY;  Surgeon: Shaaron Lamar HERO, MD;  Location: [facility]O [facility];  Service: Endoscopy;;   CARDIAC CATHETERIZATION  2003   with stent   CARDIAC CATHETERIZATION N/A 10/11/2015   Procedure: Left Heart Cath and Coronary Angiography;  Surgeon: Peter M Swaziland, MD;  Location: [facility] INVASIVE CV LAB;  Service: Cardiovascular;  Laterality: N/A;   COLON RESECTION N/A 12/13/2017   Procedure: EXPLORATORY LAPAROTOMY, SIGMOID COLECTOMY WITH COLOSTOMY;  Surgeon: Belinda Cough, MD;  Location: [facility] OR;  Service: General;  Laterality: N/A;   COLONOSCOPY N/A 09/22/2013   Procedure: COLONOSCOPY;  Surgeon: Lamar HERO Shaaron, MD;  [facility];  Service: Endoscopy;  Laterality: N/A;  9:30 AM   COLONOSCOPY N/A 02/19/2018   Procedure: COLONOSCOPY;  Surgeon: Shaaron Lamar HERO, MD;  [facility];  Service: Endoscopy;  Laterality: N/A;  2:00pm   COLONOSCOPY WITH PROPOFOL  N/A 05/16/2023   Procedure: COLONOSCOPY WITH PROPOFOL ;  Surgeon: Shaaron Lamar HERO, MD;  [facility];  Service: Endoscopy;  Laterality: N/A;  730am, asa 3   COLOSTOMY N/A 12/13/2017   Procedure: COLOSTOMY;  Surgeon: Belinda Cough, MD;  Location: [facility] OR;  Service: General;  Laterality: N/A;   COLOSTOMY REVERSAL N/A 03/26/2018   Procedure: COLOSTOMY REVERSAL;  Surgeon: Belinda Cough, MD;  Location: [facility] OR;  Service: General;  Laterality: N/A;   CORONARY ARTERY BYPASS GRAFT N/A 10/18/2015   Procedure: CORONARY ARTERY BYPASS GRAFTING (CABG)  x  five, using left internal mammary artery and right leg greater saphenous vein harvested endoscopically;  Surgeon: Elspeth JAYSON Millers, MD;  Location: [facility] OR;  Service: Open Heart Surgery;  Laterality: N/A;   ENDARTERECTOMY FEMORAL Right 08/09/2016   Procedure: ENDARTERECTOMY FEMORAL WITH VEIN PATCH ANGIOPLASTY;  Surgeon: Gaile LELON New, MD;  Location: [facility] OR;  Service: Vascular;  Laterality: Right;   ESOPHAGOGASTRODUODENOSCOPY N/A 10/24/2017   Dr. Shaaron: hiatal hernia   ESOPHAGOGASTRODUODENOSCOPY N/A 01/01/2024   Procedure: EGD (ESOPHAGOGASTRODUODENOSCOPY);  Surgeon: Cinderella Deatrice FALCON, MD;  [facility];  Service: Endoscopy;  Laterality: N/A;   ESOPHAGOGASTRODUODENOSCOPY N/A 05/19/2024   Procedure: EGD (ESOPHAGOGASTRODUODENOSCOPY);  Surgeon: Leigh Elspeth SQUIBB, MD;  Location: [facility] ENDOSCOPY;  Service: Gastroenterology;  Laterality: N/A;   ESOPHAGOGASTRODUODENOSCOPY (EGD) WITH PROPOFOL  N/A 05/16/2023   Procedure: ESOPHAGOGASTRODUODENOSCOPY (EGD) WITH PROPOFOL ;  Surgeon: Shaaron Lamar HERO, MD;  [facility];  Service: Endoscopy;  Laterality: N/A;   FEMORAL-FEMORAL BYPASS GRAFT Bilateral 08/09/2016   Procedure: REVISION BYPASS GRAFT RIGHT FEMORAL-LEFT FEMORAL ARTERY;  Surgeon: Gaile LELON New, MD;  Location: [facility] OR;  Service: Vascular;  Laterality: Bilateral;   FEMORAL-POPLITEAL BYPASS GRAFT     INTRAMEDULLARY (IM) NAIL INTERTROCHANTERIC Left 04/03/2021   Procedure: LEFT  HIP INTRAMEDULLARY (IM) NAIL INTERTROCHANTRIC;  Surgeon: Vernetta Lonni GRADE, MD;  Location: [facility];  Service: Orthopedics;  Laterality: Left;   IR FLUORO GUIDED NEEDLE PLC ASPIRATION/INJECTION LOC  12/26/2023   IR IVC FILTER RETRIEVAL / S&I /IMG GUID/MOD SED  01/15/2024   LOWER EXTREMITY ANGIOGRAPHY N/A 04/17/2024   Procedure: Lower Extremity Angiography;  Surgeon: Gretta Lonni PARAS, MD;  Location[facility] INVASIVE CV LAB;  Service: Cardiovascular;  Laterality: N/A;   LOWER EXTREMITY INTERVENTION N/A 04/17/2024    Procedure: LOWER EXTREMITY INTERVENTION;  Surgeon: Gretta Lonni PARAS, MD;  [facility];  Service: Cardiovascular;  Laterality: N/A;   MALONEY DILATION  N/A 05/16/2023   Procedure: AGAPITO DILATION;  Surgeon: Shaaron Lamar HERO, MD;  [facility];  Service: Endoscopy;  Laterality: N/A;   PERIPHERAL VASCULAR BALLOON ANGIOPLASTY Right 09/22/2021   Procedure: PERIPHERAL VASCULAR BALLOON ANGIOPLASTY;  Surgeon: Gretta Lonni PARAS, MD;  [facility];  Service: Cardiovascular;  Laterality: Right;   PERIPHERAL VASCULAR CATHETERIZATION N/A 05/08/2016   Procedure: Lower Extremity Angiography;  Surgeon: Dorn PARAS Lesches, MD;  Location: [facility] INVASIVE CV LAB;  Service: Cardiovascular;  Laterality: N/A;   PERIPHERAL VASCULAR INTERVENTION Right 09/19/2016   Procedure: Peripheral Vascular Intervention;  Surgeon: Gaile LELON New, MD;  [facility];  Service: Cardiovascular;  Laterality: Right;  ext iliac stent   PERIPHERAL VASCULAR INTERVENTION Right 09/22/2021   Procedure: PERIPHERAL VASCULAR INTERVENTION;  Surgeon: Gretta Lonni PARAS, MD;  [facility] LAB;  Service: Cardiovascular;  Laterality: Right;   POLYPECTOMY  02/19/2018   Procedure: POLYPECTOMY;  Surgeon: Shaaron Lamar HERO, MD;  [facility];  Service: Endoscopy;;   PROCTOSCOPY  10/24/2017   Procedure: PROCTOSCOPY;  Surgeon: Shaaron Lamar HERO, MD;  [facility];  Service: Endoscopy;;   TEE WITHOUT CARDIOVERSION N/A 10/18/2015   Procedure: TRANSESOPHAGEAL ECHOCARDIOGRAM (TEE);  Surgeon: Elspeth JAYSON Millers, MD;  Location: [facility] OR;  Service: Open Heart Surgery;  Laterality: N/A;     Social History:   reports that he quit smoking about 23 years ago. His smoking use included cigarettes. He started smoking about 53 years ago. He has a 60 pack-year smoking history. He has never used smokeless tobacco. He reports that he does not drink alcohol  and does not use drugs.    Family History:  His family history includes Colon cancer in his brother; Heart attack in his mother; Stroke in his mother.   Allergies Allergies  Allergen Reactions   Baclofen Other (See Comments)    Altered mental status, extreme drowsiness   Acarbose Diarrhea     Home Medications  Prior to Admission medications   Medication Sig Start Date End Date Taking? Authorizing Provider  aspirin  EC 81 MG tablet Take 1 tablet (81 mg total) by mouth daily with breakfast. 05/23/19  Yes Emokpae, Courage, MD  atorvastatin  (LIPITOR ) 40 MG tablet Take 40 mg by mouth at bedtime. 02/07/24  Yes [provider]  cilostazol  (PLETAL ) 100 MG tablet Take 100 mg by mouth 2 (two) times daily. 02/07/24  Yes [provider]  clopidogrel  (PLAVIX ) 75 MG tablet Take 1 tablet (75 mg total) by mouth in the morning. 04/17/24  Yes Gretta Lonni PARAS, MD  insulin  glargine (LANTUS ) 100 UNIT/ML injection Inject 0.1 mLs (10 Units total) into the skin at bedtime. Patient taking differently: Inject 24 Units into the skin at bedtime. 12/11/23  Yes Johnson, Clanford L, MD  lidocaine  (LIDODERM ) 5 % Place 1 patch onto the skin daily. 05/05/24  Yes [provider]  magnesium  oxide (MAG-OX) 400 MG tablet Take 400 mg by mouth 2 (two) times daily. 12/04/23  Yes [provider]  metoprolol  succinate (TOPROL -XL) 50 MG 24 hr tablet Take 0.5 tablets (25 mg total) by mouth daily. 03/23/24  Yes Emokpae, Courage, MD  vitamin B-12 (CYANOCOBALAMIN ) 1000 MCG tablet Take 1,000 mcg by mouth in the morning.   Yes [provider]  amoxicillin -clavulanate (AUGMENTIN ) 875-125 MG tablet Take 1 tablet by mouth 2 (two) times daily. Patient not taking: Reported on 05/13/2024 05/05/24   [provider]  doxycycline  (VIBRA -TABS) 100 MG tablet Take 100  mg by mouth 2 (two) times daily. Patient not taking: Reported on 05/13/2024 05/05/24   [provider]  pantoprazole  (PROTONIX ) 40 MG tablet Take 1  tablet (40 mg total) by mouth daily. 03/23/24   Pearlean Manus, MD  pregabalin  (LYRICA ) 75 MG capsule Take 75 mg by mouth 2 (two) times daily. Patient not taking: Reported on 05/13/2024 01/22/24   [provider]     The patient is critically ill due to acute hypoxic respiratory failure requiring mechanical ventilation.  Critical care was necessary to treat or prevent imminent or life-threatening deterioration. Critical care time was spent by me on the following activities: development of a treatment plan with the patient and/or surrogate as well as nursing, discussions with consultants, evaluation of the patient's response to treatment, examination of the patient, obtaining a history from the patient or surrogate, ordering and performing treatments and interventions, ordering and review of laboratory studies, ordering and review of radiographic studies, review of telemetry data including pulse oximetry, re-evaluation of patient's condition and participation in multidisciplinary rounds.   I personally spent 38 minutes providing critical care not including any separately billable procedures.   Zola LOISE Herter, MD [facility] 05/21/2024 9:54 AM    05/21/2024, 9:54 AM

## 2024-05-21 NOTE — Progress Notes (Signed)
 ETT advance by 2 cm due to low Ve and audible cuff that did not resolve by adding air to cuff. ETT secured at 26 at the lips. No audible cuff leak now and normal Ve values. Dr. Haze aware and ordered x-ray to check ETT placement.

## 2024-05-21 NOTE — Progress Notes (Signed)
 PHARMACY - TOTAL PARENTERAL NUTRITION CONSULT NOTE   Indication: prolonged NPO status  Patient Measurements: Height: 5' 7 (170.2 cm) Weight: 62.2 kg (137 lb 2 oz) IBW/kg (Calculated) : 66.1 TPN AdjBW (KG): 62.6 Body mass index is 21.48 kg/m. Usual Weight: 65kg  Assessment:  78 yo M admitted to the ICU after cardiac arrest. Pt was found to have hemorrhagic esophagitis and is now s/p EGD with placement of 4 hemostatic clips. Per GI, will avoid NG/OG tube placement for several weeks d/t proceduralization. Attempting extubation in the coming days but pt has been without nutrition for >7d now (including some time PTA) and is not expected to meet caloric needs even after extubation for a prolonged time period d/t the necessary slow advancement of his diet per GI comorbidities.  Glucose / Insulin : CBG<100, on D10@10 , 0u SSI used Electrolytes: CO2 16, CoCa 9.6, Mag 1.9, Phos 4.7  Renal: Scr 2.77 , BUN 35 Hepatic: LFTs ok - Alb 1.9, TG 198 Intake / Output; MIVF: UO 0.5 ml/kg/hr, 0 mL NGT  GI Imaging: none since TPN start GI Surgeries / Procedures: none since TPN start  Central access: 05/21/2024 TPN start date: 05/21/2024  Nutritional Goals: Goal TPN rate is 62 mL/hr (provides 100 g of protein and 1897 kcals per day)  RD Assessment: Estimated Needs Total Energy Estimated Needs: 1800-2000 kcal/d Total Protein Estimated Needs: 90-105 grams Total Fluid Estimated Needs: 1.8-2L/d  Current Nutrition:  NPO  Plan:  Switching propofol  > precedex  today, so should not have other exogenous lipid sources today.  --- Start concentrated TPN at 35 mL/hr at 1800  Electrolytes in TPN: Na 50 mEq/L, K 18 mEq/L, Ca 5 mEq/L, Mg 10 mEq/L, and Phos 6 mmol/L. Cl:Ac 1:2 Add standard MVI and trace elements to TPN Continue Sensitive q4h SSI and adjust as needed  Turn mIVF off at 1800 once TPN begins Monitor TPN labs on Mon/Thurs, daily until stable  Sharyne Glatter, PharmD, BCCCP Critical Care  Clinical Pharmacist 05/21/2024 8:12 AM

## 2024-05-21 NOTE — TOC Progression Note (Addendum)
 Transition of Care [facility]) - Progression Note    Patient Details  Name: [name] MRN: [id number] Date of Birth: Mar 03, 1946  Transition of Care [facility]) CM/SW Contact  Lauraine FORBES Saa, LCSWA Phone Number: 05/21/2024, 11:38 AM  Clinical Narrative:     11:38 AM Per progressions, patient remains intubated and is to receive a tunnel catheter today for TPN vs tube feeds. CSW will continue to follow.  Expected Discharge Plan: Skilled Nursing Facility Barriers to Discharge: Continued Medical Work up               Expected Discharge Plan and Services In-house Referral: Clinical Social Work Discharge Planning Services: CM Consult Post Acute Care Choice: Skilled Nursing Facility Living arrangements for the past 2 months: Single Family Home                                       Social Drivers of Health (SDOH) Interventions SDOH Screenings   Food Insecurity: No Food Insecurity (05/14/2024)  Housing: Low Risk  (05/13/2024)  Transportation Needs: No Transportation Needs (05/13/2024)  Utilities: Not At Risk (05/13/2024)  Depression (PHQ2-9): Low Risk  (04/10/2024)  Financial Resource Strain: Low Risk  (10/08/2023)   Received from [facility]  Social Connections: Socially Integrated (05/14/2024)  Tobacco Use: Medium Risk (05/19/2024)  Health Literacy: High Risk (10/08/2023)   Received from [facility]    Readmission Risk Interventions    05/14/2024   10:56 AM 03/22/2024    4:21 PM 01/29/2024   11:17 AM  Readmission Risk Prevention Plan  Transportation Screening Complete Complete Complete  PCP or Specialist Appt within 3-5 Days   Complete  HRI or Home Care Consult   Complete  Palliative Care Screening   Not Applicable  Medication Review (RN Care Manager) Complete Complete Complete  PCP or Specialist appointment within 3-5 days of discharge  Complete   HRI or Home Care Consult Complete Complete   SW Recovery Care/Counseling Consult Complete Complete   Palliative Care  Screening Not Applicable Not Applicable   Skilled Nursing Facility Complete Not Applicable

## 2024-05-22 ENCOUNTER — Inpatient Hospital Stay (HOSPITAL_COMMUNITY)

## 2024-05-22 DIAGNOSIS — R570 Cardiogenic shock: Secondary | ICD-10-CM | POA: Diagnosis not present

## 2024-05-22 DIAGNOSIS — I469 Cardiac arrest, cause unspecified: Secondary | ICD-10-CM | POA: Diagnosis not present

## 2024-05-22 DIAGNOSIS — J810 Acute pulmonary edema: Secondary | ICD-10-CM | POA: Diagnosis not present

## 2024-05-22 DIAGNOSIS — J9601 Acute respiratory failure with hypoxia: Secondary | ICD-10-CM | POA: Diagnosis not present

## 2024-05-22 LAB — COMPREHENSIVE METABOLIC PANEL WITH GFR
ALT: 56 U/L — ABNORMAL HIGH (ref 0–44)
AST: 23 U/L (ref 15–41)
Albumin: 1.9 g/dL — ABNORMAL LOW (ref 3.5–5.0)
Alkaline Phosphatase: 74 U/L (ref 38–126)
Anion gap: 12 (ref 5–15)
BUN: 34 mg/dL — ABNORMAL HIGH (ref 8–23)
CO2: 17 mmol/L — ABNORMAL LOW (ref 22–32)
Calcium: 8.1 mg/dL — ABNORMAL LOW (ref 8.9–10.3)
Chloride: 111 mmol/L (ref 98–111)
Creatinine, Ser: 2.29 mg/dL — ABNORMAL HIGH (ref 0.61–1.24)
GFR, Estimated: 29 mL/min — ABNORMAL LOW (ref >60)
Glucose, Bld: 193 mg/dL — ABNORMAL HIGH (ref 70–99)
Potassium: 3.8 mmol/L (ref 3.5–5.1)
Sodium: 140 mmol/L (ref 135–145)
Total Bilirubin: 0.7 mg/dL (ref 0.0–1.2)
Total Protein: 5.1 g/dL — ABNORMAL LOW (ref 6.5–8.1)

## 2024-05-22 LAB — CBC WITH DIFFERENTIAL/PLATELET
Abs Immature Granulocytes: 0.2 K/uL — ABNORMAL HIGH (ref 0.00–0.07)
Basophils Absolute: 0.1 K/uL (ref 0.0–0.1)
Basophils Relative: 0 %
Eosinophils Absolute: 0.2 K/uL (ref 0.0–0.5)
Eosinophils Relative: 2 %
HCT: 25.1 % — ABNORMAL LOW (ref 39.0–52.0)
Hemoglobin: 7.6 g/dL — ABNORMAL LOW (ref 13.0–17.0)
Immature Granulocytes: 2 %
Lymphocytes Relative: 3 %
Lymphs Abs: 0.3 K/uL — ABNORMAL LOW (ref 0.7–4.0)
MCH: 25.9 pg — ABNORMAL LOW (ref 26.0–34.0)
MCHC: 30.3 g/dL (ref 30.0–36.0)
MCV: 85.7 fL (ref 80.0–100.0)
Monocytes Absolute: 0.8 K/uL (ref 0.1–1.0)
Monocytes Relative: 7 %
Neutro Abs: 10.3 K/uL — ABNORMAL HIGH (ref 1.7–7.7)
Neutrophils Relative %: 86 %
Platelets: 197 K/uL (ref 150–400)
RBC: 2.93 MIL/uL — ABNORMAL LOW (ref 4.22–5.81)
RDW: 20.2 % — ABNORMAL HIGH (ref 11.5–15.5)
WBC: 11.9 K/uL — ABNORMAL HIGH (ref 4.0–10.5)
nRBC: 0 % (ref 0.0–0.2)

## 2024-05-22 LAB — GLUCOSE, CAPILLARY
Glucose-Capillary: 158 mg/dL — ABNORMAL HIGH (ref 70–99)
Glucose-Capillary: 160 mg/dL — ABNORMAL HIGH (ref 70–99)
Glucose-Capillary: 167 mg/dL — ABNORMAL HIGH (ref 70–99)
Glucose-Capillary: 171 mg/dL — ABNORMAL HIGH (ref 70–99)
Glucose-Capillary: 176 mg/dL — ABNORMAL HIGH (ref 70–99)
Glucose-Capillary: 181 mg/dL — ABNORMAL HIGH (ref 70–99)
Glucose-Capillary: 186 mg/dL — ABNORMAL HIGH (ref 70–99)

## 2024-05-22 LAB — PHOSPHORUS: Phosphorus: 4.1 mg/dL (ref 2.5–4.6)

## 2024-05-22 LAB — MAGNESIUM: Magnesium: 2 mg/dL (ref 1.7–2.4)

## 2024-05-22 MED ORDER — TRACE MINERALS CU-MN-SE-ZN 300-55-60-3000 MCG/ML IV SOLN
INTRAVENOUS | Status: AC
  Filled 2024-05-22: qty 537.6

## 2024-05-22 MED ORDER — MIDAZOLAM HCL 2 MG/2ML IJ SOLN
2.0000 mg | INTRAMUSCULAR | Status: DC | PRN
Start: 2024-05-22 — End: 2024-05-23
  Administered 2024-05-22 – 2024-05-23 (×3): 2 mg via INTRAVENOUS
  Filled 2024-05-22 (×3): qty 2

## 2024-05-22 MED ORDER — MIDAZOLAM HCL 2 MG/2ML IJ SOLN
INTRAMUSCULAR | Status: AC
  Filled 2024-05-22: qty 2

## 2024-05-22 MED ORDER — INSULIN ASPART 100 UNIT/ML IJ SOLN
0.0000 [IU] | INTRAMUSCULAR | Status: DC
  Administered 2024-05-22 (×4): 3 [IU] via SUBCUTANEOUS
  Administered 2024-05-22: 2 [IU] via SUBCUTANEOUS
  Administered 2024-05-23: 3 [IU] via SUBCUTANEOUS
  Administered 2024-05-23 (×2): 5 [IU] via SUBCUTANEOUS
  Administered 2024-05-23: 3 [IU] via SUBCUTANEOUS
  Administered 2024-05-23: 2 [IU] via SUBCUTANEOUS
  Administered 2024-05-24: 3 [IU] via SUBCUTANEOUS
  Administered 2024-05-24: 5 [IU] via SUBCUTANEOUS

## 2024-05-22 MED ORDER — PROPOFOL 1000 MG/100ML IV EMUL
0.0000 ug/kg/min | INTRAVENOUS | Status: DC
  Administered 2024-05-22: 60 ug/kg/min via INTRAVENOUS
  Administered 2024-05-22: 10 ug/kg/min via INTRAVENOUS
  Administered 2024-05-23: 40 ug/kg/min via INTRAVENOUS
  Administered 2024-05-23: 60 ug/kg/min via INTRAVENOUS
  Administered 2024-05-23: 20 ug/kg/min via INTRAVENOUS
  Filled 2024-05-22 (×3): qty 100
  Filled 2024-05-22: qty 200

## 2024-05-22 MED ORDER — MIDAZOLAM HCL 2 MG/2ML IJ SOLN
2.0000 mg | Freq: Once | INTRAMUSCULAR | Status: AC
  Administered 2024-05-22: 2 mg via INTRAVENOUS

## 2024-05-22 NOTE — Progress Notes (Signed)
 PHARMACY - TOTAL PARENTERAL NUTRITION CONSULT NOTE   Indication: prolonged NPO status  Patient Measurements: Height: 5' 7 (170.2 cm) Weight: 62.2 kg (137 lb 2 oz) IBW/kg (Calculated) : 66.1 TPN AdjBW (KG): 62.6 Body mass index is 21.48 kg/m. Usual Weight: 65kg  Assessment:  78 yo M admitted to the ICU after cardiac arrest. Pt was found to have hemorrhagic esophagitis and is now s/p EGD with placement of 4 hemostatic clips. Per GI, will avoid NG/OG tube placement for several weeks d/t proceduralization. Attempting extubation in the coming days but pt has been without nutrition for >7d now (including some time PTA) and is not expected to meet caloric needs even after extubation for a prolonged time period d/t the necessary slow advancement of his diet per GI comorbidities.  Glucose / Insulin : A1c 9.0- CBG 171-193; 6u SSI in 24 hours Off D10 with TPN ongoing Electrolytes: CO2 17 CoCa 9.6, Mag 2.0, Phos 4.1  Renal: Scr 2.29 , BUN 34 Hepatic: LFTs ok - Alb 1.9, TG 198 Intake / Output; MIVF: UO 0.7 ml/kg/hr, 0 mL NGT  GI Imaging: none since TPN start GI Surgeries / Procedures: none since TPN start  Central access: 05/21/2024 TPN start date: 05/21/2024  Nutritional Goals: Goal TPN rate is 62 mL/hr (provides 100 g of protein and 1897 kcals per day)  RD Assessment: Estimated Needs Total Energy Estimated Needs: 1800-2000 kcal/d Total Protein Estimated Needs: 90-105 grams Total Fluid Estimated Needs: 1.8-2L/d  Current Nutrition:  NPO  Plan:  Advance concentrated TPN at 50 mL/hr at 1800 to provide 80% of needs on day 2 of TPN  Electrolytes in TPN: Na 50 mEq/L, K 20 mEq/L, Ca 5 mEq/L, Mg 10 mEq/L, and Phos 5 mmol/L. Max Acetate Continue standard MVI and trace elements to TPN Increase to Moderate q4h SSI and adjust as needed  Monitor TPN labs on Mon/Thurs, daily until stable  Sharyne Glatter, PharmD, BCCCP Critical Care Clinical Pharmacist 05/22/2024 8:38 AM

## 2024-05-22 NOTE — Progress Notes (Signed)
 Interventional Radiology Brief Note:  IR consulted for tunneled catheter placement for anticipated long-term TPN.  Patient currently admitted to ICU with hypoxic respiratory failure, toxic metabolic encephalopathy, cardiogenic shock after PEA with ROSC 10/4.  After conferring with medical team, PICC placed at bedside for initiation of TPN in high-risk placement.  Will cancel tunneled central line request at present.  PICC could potentially remain in place at discharge pending patient needs once medically ready and facility requirements.   Please reconsult IR if needed.   Tarryn Bogdan, MS RD PA-C

## 2024-05-22 NOTE — TOC Progression Note (Addendum)
 Transition of Care [facility]) - Progression Note    Patient Details  Name: [name] MRN: [id number] Date of Birth: 08-02-1946  [facility] CM/SW Contact  Lauraine FORBES Saa, LCSWA Phone Number: 05/22/2024, 11:44 AM  Clinical Narrative:     11:44 AM Per progressions, patient received PICC line and remains intubated but did not receive tunnel catheter. CSW will continue to follow.  Expected Discharge Plan: Skilled Nursing Facility Barriers to Discharge: Continued Medical Work up               Expected Discharge Plan and Services In-house Referral: Clinical Social Work Discharge Planning Services: CM Consult Post Acute Care Choice: Skilled Nursing Facility Living arrangements for the past 2 months: Single Family Home                                       Social Drivers of Health (SDOH) Interventions SDOH Screenings   Food Insecurity: No Food Insecurity (05/14/2024)  Housing: Low Risk  (05/13/2024)  Transportation Needs: No Transportation Needs (05/13/2024)  Utilities: Not At Risk (05/13/2024)  Depression (PHQ2-9): Low Risk  (04/10/2024)  Financial Resource Strain: Low Risk  (10/08/2023)   Received from [facility]  Social Connections: Socially Integrated (05/14/2024)  Tobacco Use: Medium Risk (05/19/2024)  Health Literacy: High Risk (10/08/2023)   Received from [facility]    Readmission Risk Interventions    05/14/2024   10:56 AM 03/22/2024    4:21 PM 01/29/2024   11:17 AM  Readmission Risk Prevention Plan  Transportation Screening Complete Complete Complete  PCP or Specialist Appt within 3-5 Days   Complete  HRI or Home Care Consult   Complete  Palliative Care Screening   Not Applicable  Medication Review (RN Care Manager) Complete Complete Complete  PCP or Specialist appointment within 3-5 days of discharge  Complete   HRI or Home Care Consult Complete Complete   SW Recovery Care/Counseling Consult Complete Complete   Palliative Care  Screening Not Applicable Not Applicable   Skilled Nursing Facility Complete Not Applicable

## 2024-05-22 NOTE — Plan of Care (Signed)
  Problem: Education: Goal: Knowledge of General Education information will improve Description: Including pain rating scale, medication(s)/side effects and non-pharmacologic comfort measures Outcome: Not Progressing   Problem: Health Behavior/Discharge Planning: Goal: Ability to manage health-related needs will improve Outcome: Not Progressing   Problem: Clinical Measurements: Goal: Ability to maintain clinical measurements within normal limits will improve Outcome: Not Progressing Goal: Will remain free from infection Outcome: Not Progressing Goal: Diagnostic test results will improve Outcome: Not Progressing Goal: Respiratory complications will improve Outcome: Not Progressing Goal: Cardiovascular complication will be avoided Outcome: Not Progressing   Problem: Activity: Goal: Risk for activity intolerance will decrease Outcome: Not Progressing   Problem: Nutrition: Goal: Adequate nutrition will be maintained Outcome: Not Progressing   Problem: Coping: Goal: Level of anxiety will decrease Outcome: Not Progressing   Problem: Elimination: Goal: Will not experience complications related to bowel motility Outcome: Not Progressing Goal: Will not experience complications related to urinary retention Outcome: Not Progressing   Problem: Pain Managment: Goal: General experience of comfort will improve and/or be controlled Outcome: Not Progressing   Problem: Safety: Goal: Ability to remain free from injury will improve Outcome: Not Progressing   Problem: Skin Integrity: Goal: Risk for impaired skin integrity will decrease Outcome: Not Progressing   Problem: Education: Goal: Ability to describe self-care measures that may prevent or decrease complications (Diabetes Survival Skills Education) will improve Outcome: Not Progressing Goal: Individualized Educational Video(s) Outcome: Not Progressing   Problem: Coping: Goal: Ability to adjust to condition or change in  health will improve Outcome: Not Progressing   Problem: Fluid Volume: Goal: Ability to maintain a balanced intake and output will improve Outcome: Not Progressing   Problem: Health Behavior/Discharge Planning: Goal: Ability to identify and utilize available resources and services will improve Outcome: Not Progressing Goal: Ability to manage health-related needs will improve Outcome: Not Progressing   Problem: Metabolic: Goal: Ability to maintain appropriate glucose levels will improve Outcome: Not Progressing   Problem: Nutritional: Goal: Maintenance of adequate nutrition will improve Outcome: Not Progressing Goal: Progress toward achieving an optimal weight will improve Outcome: Not Progressing   Problem: Skin Integrity: Goal: Risk for impaired skin integrity will decrease Outcome: Not Progressing   Problem: Tissue Perfusion: Goal: Adequacy of tissue perfusion will improve Outcome: Not Progressing   Problem: Activity: Goal: Ability to tolerate increased activity will improve Outcome: Not Progressing   Problem: Respiratory: Goal: Ability to maintain a clear airway and adequate ventilation will improve Outcome: Not Progressing   Problem: Role Relationship: Goal: Method of communication will improve Outcome: Not Progressing   Problem: Safety: Goal: Non-violent Restraint(s) Outcome: Not Progressing

## 2024-05-22 NOTE — Progress Notes (Signed)
 NAME:  [name], MRN:  [id number], DOB:  December 05, 1945, LOS: 9 ADMISSION DATE:  05/13/2024 CHIEF COMPLAINT:  Arrest.    History of Present Illness:  78 y/o male with PMH for CAD status post CABG, DM type 2, HTN, hyperlipidemia, CKD stage IV, COPD, PAD, atrial tachycardia, diverticulitis status post colostomy and subsequent takedown, TIA who presented initially from [facility] with generalized weakness, confusion, difficulty ambulating and decreased oral intake.  He has had several episodes of pneumonia and has been deteriorating since May 2025.  He was treated for CAP in August at which time he also was noted to have acute on chronic renal failure.  CT head negative on admission.  Her also had episodic delirium.   Review of the medical record shows the patient has had enhancing lesions in his lumbar and thoracic spine on MRI (12/07/23).  PET scan (12/27/23) also showed Multifocal mild areas of abnormal uptake along the skeleton including several ribs, vertebral bodies and scattered along the pelvis. Some of these lesions are associated with areas of bony sclerosis.  He had separate T9 and L3 bone biopsies without any metastatic carcinoma identified.  He has seen medical oncology and has undergone extensive workup including serum immunoglobulins and SPEP which have been negative.  In addition, tumor markers CEA, CA 19-9, alpha-fetoprotein, and PSA were all unremarkable. During this hospitalization, wife stated she has noticed some shaking in his right arm and right chest intermittently.    This evening he was on and he c/o back pain, took his O2 off and then went into pulseless V tach and then PEA.  Code blue called and CPR started.  Initially lost his pulse and then regained only to lose pulse again then regained.  He was subsequently transferred to 2M11.  Pertinent  Medical History   CAD status post CABG, DM type 2, HTN, hyperlipidemia, CKD stage IV, COPD, PAD, atrial tachycardia, diverticulitis status  post colostomy and subsequent takedown, TIA   Interim History / Subjective:  Currently sedated and intubated.  Opening eyes while on sedation.  Seem to recognize his wife's voice.  Significant Hospital Events: 10/4 transfer to ICU 10/4 Code blue  Objective    Blood pressure 102/62, pulse (!) 52, temperature 98.1 F (36.7 C), resp. rate 17, height 5' 7 (1.702 m), weight 62.2 kg, SpO2 99%.    Vent Mode: PRVC FiO2 (%):  [40 %] 40 % Set Rate:  [17 bmp] 17 bmp Vt Set:  [530 mL] 530 mL PEEP:  [5 cmH20] 5 cmH20 Pressure Support:  [5 cmH20] 5 cmH20 Plateau Pressure:  [19 cmH20] 19 cmH20   Intake/Output Summary (Last 24 hours) at 05/22/2024 0835 Last data filed at 05/22/2024 0700 Gross per 24 hour  Intake 1096.33 ml  Output 1100 ml  Net -3.67 ml   Filed Weights   05/20/24 0600 05/21/24 0405 05/22/24 0500  Weight: 64.3 kg 62.2 kg 62.2 kg    Examination: General: Sedated and intubated. Opens eyes, in mild distress, more alert but still not following any commands  Lungs: clear to auscultation bilaterally.  Heart: regular rate rhythm, no murmur appreciated.  Abdomen: non tender, non distended. Normal BS.  Neuro: Sedated.  Opening eyes.  Pupils bilaterally equal.  Moving all 4 extremities. Does not follow commands. More alert today    Assessment and Plan  Presented to the hospital on 9/30 for generalized weakness confusion, decreased oral intake and difficulty ambulating.  Patient was admitted to hospitalist service.  Hospital admission in  August 2025 for CAP.  He was being treated on the hospital floor for acute on chronic renal failure, metabolic encephalopathy and parotitis with antibiotics. Overnight on 10/4 had V. tach subsequently led to cardiac arrest, received 1 of epi, no shock.  During first pulse check was PEA arrest.   Goals of care discussion with family today  Discussed with family today, regarding the overall status of Mr Beaubrun, he has had multiple admissions to the  hospital this year, he suffered a cardiac arrest during this hospital stay, he has had issues with GI bleeding, and now he has altered mentation with inability to extubate due his AMS. He can't tolerate tube feeds due to his recent GI bleed and clipping and will need TPN through a central line. Family would like to continue trying to safely extubate him at this time and they are ok with TPN through a central line. They expressed that they care about his overall quality of life and would not want him to have a trach if he needs it should he not be able to get off the mechanical ventilation. For now, we will continue with daily awakening and spontaneous breathing trials.    #Acute hypoxic respiratory failure, currently barrier to extubation is AMS  - Switch prop to dex  - Keep on fent Daily awakening trials and SBTs   #Toxic metabolic encphalopathy  Repeat Head CT negative Brain MRI on 10/3 motion degraded but no large abnormality noted   V. tach leading to cardiac arrest- ?  V. tach arrest versus PEA arrest: - Never received shock. - EKG postcardiac arrest with ST wave depression in lateral leads. - Troponin minimally elevated.  Will recheck.  BNP 1200. - Lactic acid 2.8> 1.1. - Echo: postcardiac arrest: EF normal with grade 1 diastolic dysfunction. - Will get lower extremity venous Dopplers. - Cardiology consulted.  Cardiogenic shock: - Postcardiac arrest. - off levophed  - Also contributed by sedation.  Monitor.   Pulmonary edema: Acute hypoxic respiratory failure: - Reportedly was hypoxic prior to cardiac arrest. - Vent bundle. - tolerated SBT today but barrier to extubation is mentation  - Hold further diuresis as below.  Shock liver (improving) : - Will trend LFTs.   CGE: Suspected UGIB: - IV PPI twice daily. - Hold heparin .  Continue aspirin  and Plavix  and monitor hemoglobin closely with recent femorofemoral bypass. - Trend hemoglobin. - Maintain OG.  Acute metabolic  encephalopathy: - Currently sedated.  Neurology following. - MRI motion degraded but negative. - EEG negative.  AKI on CKD: - Baseline 1.9-2.2. -Monitor.  Minimally elevated postcardiac arrest. -Was being given IV fluids before. -Hold diuresis for now. - Replete potassium   Failure to thrive: - Nutrition consult. Will consider TPN tomorrow if not doing better on SBTs   Possible seizures: -EEG negative for seizures. - Neurology following.  Parotitis: - Was started on Unasyn and Zyvox by hospitalist service. - Continue for now. Plan for 5 days total, can stop tomorrow morning   Peripheral arterial disease: - Status post femorofemoral bypass - 04/17/2024--angioplasty right common iliac and external iliac arteries--Dr. Gretta - Continue aspirin , Plavix , Pletal    C6 spinous process fracture -per neurosurgery--continue operative treatment; can use cervical collar if significant pain  Hypertension: Hyperlipidemia: -Will resume meds as appropriate.  DM 2: - SSI.  Constipation Start docusate supp  Moderate Malnutrition Patient with no oral intake for 8 days. Can't obtain enteral access with GI bleeds  Discussed with family and plan to place a  PICC line and start TPN      Labs   CBC: Recent Labs  Lab 05/17/24 2320 05/18/24 0018 05/18/24 1253 05/18/24 1902 05/19/24 0930 05/20/24 0541  WBC 20.0*  --  15.3* 12.8* 10.1 10.6*  HGB 8.0* 7.5* 6.2* 7.0* 7.8* 7.4*  HCT 27.1* 22.0* 20.4* 22.6* 25.1* 23.8*  MCV 82.4  --  82.6 83.4 83.9 84.7  PLT 423*  --  333 275 263 239    Basic Metabolic Panel: Recent Labs  Lab 05/16/24 0414 05/17/24 0517 05/17/24 2320 05/18/24 0018 05/19/24 0930 05/20/24 0541 05/21/24 0356 05/22/24 0640  NA 139 140 140 140 139 141 140 140  K 4.0 3.9 4.1 4.2 3.6 3.3* 3.8 3.8  CL 106 105 109  --  108 109 110 111  CO2 18* 16* 13*  --  20* 20* 16* 17*  GLUCOSE 72 72 143*  --  109* 81 75 193*  BUN 41* 36* 37*  --  40* 36* 35* 34*  CREATININE  2.79* 2.59* 2.65*  --  2.81* 2.78* 2.77* 2.29*  CALCIUM  9.6 9.5 8.8*  --  8.0* 8.2* 7.9* 8.1*  MG 2.2 2.0 2.0  --   --   --  1.9 2.0  PHOS  --   --   --   --   --   --  4.7* 4.1   GFR: Estimated Creatinine Clearance: 24.1 mL/min (A) (by C-G formula based on SCr of 2.29 mg/dL (H)). Recent Labs  Lab 05/17/24 2320 05/18/24 0243 05/18/24 1253 05/18/24 1902 05/19/24 0930 05/20/24 0541  WBC 20.0*  --  15.3* 12.8* 10.1 10.6*  LATICACIDVEN 2.8* 1.1  --   --   --   --     Liver Function Tests: Recent Labs  Lab 05/17/24 2320 05/19/24 0930 05/20/24 0925 05/21/24 0356 05/22/24 0640  AST 475* 132* 68* 33 23  ALT 264* 159* 121* 75* 56*  ALKPHOS 102 85 95 74 74  BILITOT 1.0 0.7 0.8 1.0 0.7  PROT 6.4* 5.6* 6.4* 5.2* 5.1*  ALBUMIN  2.5* 2.2* 2.4* 1.9* 1.9*   No results for input(s): LIPASE, AMYLASE in the last 168 hours. Recent Labs  Lab 05/17/24 0517  AMMONIA <13    ABG    Component Value Date/Time   PHART 7.356 05/18/2024 0018   PCO2ART 26.3 (L) 05/18/2024 0018   PO2ART 72 (L) 05/18/2024 0018   HCO3 15.0 (L) 05/18/2024 0018   TCO2 16 (L) 05/18/2024 0018   ACIDBASEDEF 10.0 (H) 05/18/2024 0018   O2SAT 95 05/18/2024 0018      Past Medical History:  He,  has a past medical history of Anemia, CAD S/P percutaneous coronary angioplasty (1998), CKD (chronic kidney disease) stage 3, GFR 30-59 ml/min (HCC), Diabetes mellitus without complication (HCC), Diverticulitis (08/16/2017), GERD (gastroesophageal reflux disease), CABG (10/2015), Hypercholesteremia, Hypertension, Neuropathy, Peripheral vascular disease, Pneumonia, and Tachycardia.   Surgical History:   Past Surgical History:  Procedure Laterality Date   ABDOMINAL AORTOGRAM Bilateral 09/19/2016   Procedure: iliac;  Surgeon: Gaile LELON New, MD;  Location: [facility] INVASIVE CV LAB;  Service: Cardiovascular;  Laterality: Bilateral;   ABDOMINAL AORTOGRAM N/A 04/17/2024   Procedure: ABDOMINAL AORTOGRAM;  Surgeon: Gretta Lonni PARAS, MD;  Location: [facility] INVASIVE CV LAB;  Service: Cardiovascular;  Laterality: N/A;   ABDOMINAL AORTOGRAM W/LOWER EXTREMITY N/A 09/22/2021   Procedure: ABDOMINAL AORTOGRAM W/LOWER EXTREMITY;  Surgeon: Gretta Lonni PARAS, MD;  Location: [facility]VAS[facility] CV LAB;  Service: Cardiovascular;  Laterality: N/A;   ABDOMINAL AORTOGRAM W/LOWER  EXTREMITY N/A 10/05/2022   Procedure: ABDOMINAL AORTOGRAM W/LOWER EXTREMITY;  Surgeon: Gretta Lonni PARAS, MD;  Location: [facility] INVASIVE CV LAB;  Service: Cardiovascular;  Laterality: N/A;   BIOPSY  05/16/2023   Procedure: BIOPSY;  Surgeon: Shaaron Lamar HERO, MD;  Location: [facility]O [facility];  Service: Endoscopy;;   CARDIAC CATHETERIZATION  2003   with stent   CARDIAC CATHETERIZATION N/A 10/11/2015   Procedure: Left Heart Cath and Coronary Angiography;  Surgeon: Peter M Swaziland, MD;  Location: [facility] INVASIVE CV LAB;  Service: Cardiovascular;  Laterality: N/A;   COLON RESECTION N/A 12/13/2017   Procedure: EXPLORATORY LAPAROTOMY, SIGMOID COLECTOMY WITH COLOSTOMY;  Surgeon: Belinda Cough, MD;  Location: [facility] OR;  Service: General;  Laterality: N/A;   COLONOSCOPY N/A 09/22/2013   Procedure: COLONOSCOPY;  Surgeon: Lamar HERO Shaaron, MD;  Location: [facility];  Service: Endoscopy;  Laterality: N/A;  9:30 AM   COLONOSCOPY N/A 02/19/2018   Procedure: COLONOSCOPY;  Surgeon: Shaaron Lamar HERO, MD;  Location: [facility];  Service: Endoscopy;  Laterality: N/A;  2:00pm   COLONOSCOPY WITH PROPOFOL  N/A 05/16/2023   Procedure: COLONOSCOPY WITH PROPOFOL ;  Surgeon: Shaaron Lamar HERO, MD;  Location: [facility];  Service: Endoscopy;  Laterality: N/A;  730am, asa 3   COLOSTOMY N/A 12/13/2017   Procedure: COLOSTOMY;  Surgeon: Belinda Cough, MD;  Location: [facility] OR;  Service: General;  Laterality: N/A;   COLOSTOMY REVERSAL N/A 03/26/2018   Procedure: COLOSTOMY REVERSAL;  Surgeon: Belinda Cough, MD;  Location: [facility] OR;  Service: General;  Laterality: N/A;   CORONARY ARTERY BYPASS GRAFT N/A 10/18/2015    Procedure: CORONARY ARTERY BYPASS GRAFTING (CABG) x  five, using left internal mammary artery and right leg greater saphenous vein harvested endoscopically;  Surgeon: Elspeth JAYSON Millers, MD;  Location: [facility] OR;  Service: Open Heart Surgery;  Laterality: N/A;   ENDARTERECTOMY FEMORAL Right 08/09/2016   Procedure: ENDARTERECTOMY FEMORAL WITH VEIN PATCH ANGIOPLASTY;  Surgeon: Gaile LELON New, MD;  Location: [facility] OR;  Service: Vascular;  Laterality: Right;   ESOPHAGOGASTRODUODENOSCOPY N/A 10/24/2017   Dr. Shaaron: hiatal hernia   ESOPHAGOGASTRODUODENOSCOPY N/A 01/01/2024   Procedure: EGD (ESOPHAGOGASTRODUODENOSCOPY);  Surgeon: Cinderella Deatrice FALCON, MD;  [facility];  Service: Endoscopy;  Laterality: N/A;   ESOPHAGOGASTRODUODENOSCOPY N/A 05/19/2024   Procedure: EGD (ESOPHAGOGASTRODUODENOSCOPY);  Surgeon: Leigh Elspeth SQUIBB, MD;  Location: [facility] ENDOSCOPY;  Service: Gastroenterology;  Laterality: N/A;   ESOPHAGOGASTRODUODENOSCOPY (EGD) WITH PROPOFOL  N/A 05/16/2023   Procedure: ESOPHAGOGASTRODUODENOSCOPY (EGD) WITH PROPOFOL ;  Surgeon: Shaaron Lamar HERO, MD;  [facility];  Service: Endoscopy;  Laterality: N/A;   FEMORAL-FEMORAL BYPASS GRAFT Bilateral 08/09/2016   Procedure: REVISION BYPASS GRAFT RIGHT FEMORAL-LEFT FEMORAL ARTERY;  Surgeon: Gaile LELON New, MD;  Location: [facility] OR;  Service: Vascular;  Laterality: Bilateral;   FEMORAL-POPLITEAL BYPASS GRAFT     INTRAMEDULLARY (IM) NAIL INTERTROCHANTERIC Left 04/03/2021   Procedure: LEFT  HIP INTRAMEDULLARY (IM) NAIL INTERTROCHANTRIC;  Surgeon: Vernetta Lonni GRADE, MD;  Location: [facility] OR;  Service: Orthopedics;  Laterality: Left;   IR FLUORO GUIDED NEEDLE PLC ASPIRATION/INJECTION LOC  12/26/2023   IR IVC FILTER RETRIEVAL / S&I /IMG GUID/MOD SED  01/15/2024   LOWER EXTREMITY ANGIOGRAPHY N/A 04/17/2024   Procedure: Lower Extremity Angiography;  Surgeon: Gretta Lonni PARAS, MD;  Location: [facility] INVASIVE CV LAB;  Service: Cardiovascular;  Laterality:  N/A;   LOWER EXTREMITY INTERVENTION N/A 04/17/2024   Procedure: LOWER EXTREMITY INTERVENTION;  Surgeon: Gretta Lonni PARAS, MD;  Location: [facility];  Service:  Cardiovascular;  Laterality: N/A;   MALONEY DILATION N/A 05/16/2023   Procedure: AGAPITO DILATION;  Surgeon: Shaaron Lamar HERO, MD;  Location: [facility];  Service: Endoscopy;  Laterality: N/A;   PERIPHERAL VASCULAR BALLOON ANGIOPLASTY Right 09/22/2021   Procedure: PERIPHERAL VASCULAR BALLOON ANGIOPLASTY;  Surgeon: Gretta Lonni PARAS, MD;  [facility]: [facility];  Service: Cardiovascular;  Laterality: Right;   PERIPHERAL VASCULAR CATHETERIZATION N/A 05/08/2016   Procedure: Lower Extremity Angiography;  Surgeon: Dorn PARAS Lesches, MD;  Location: [facility] INVASIVE CV LAB;  Service: Cardiovascular;  Laterality: N/A;   PERIPHERAL VASCULAR INTERVENTION Right 09/19/2016   Procedure: Peripheral Vascular Intervention;  Surgeon: Gaile LELON New, MD;  Location: [facility] CV LAB;  Service: Cardiovascular;  Laterality: Right;  ext iliac stent   PERIPHERAL VASCULAR INTERVENTION Right 09/22/2021   Procedure: PERIPHERAL VASCULAR INTERVENTION;  Surgeon: Gretta Lonni PARAS, MD;  Location: [facility];  Service: Cardiovascular;  Laterality: Right;   POLYPECTOMY  02/19/2018   Procedure: POLYPECTOMY;  Surgeon: Shaaron Lamar HERO, MD;  [facility];  Service: Endoscopy;;   PROCTOSCOPY  10/24/2017   Procedure: PROCTOSCOPY;  Surgeon: Shaaron Lamar HERO, MD;  [facility];  Service: Endoscopy;;   TEE WITHOUT CARDIOVERSION N/A 10/18/2015   Procedure: TRANSESOPHAGEAL ECHOCARDIOGRAM (TEE);  Surgeon: Elspeth JAYSON Millers, MD;  Location: [facility] OR;  Service: Open Heart Surgery;  Laterality: N/A;     Social History:   reports that he quit smoking about 23 years ago. His smoking use included cigarettes. He started smoking about 53 years ago. He has a 60 pack-year smoking history. He has never used smokeless tobacco. He reports that he  does not drink alcohol  and does not use drugs.   Family History:  His family history includes Colon cancer in his brother; Heart attack in his mother; Stroke in his mother.   Allergies Allergies  Allergen Reactions   Baclofen Other (See Comments)    Altered mental status, extreme drowsiness   Acarbose Diarrhea     Home Medications  Prior to Admission medications   Medication Sig Start Date End Date Taking? Authorizing Provider  aspirin  EC 81 MG tablet Take 1 tablet (81 mg total) by mouth daily with breakfast. 05/23/19  Yes Emokpae, Courage, MD  atorvastatin  (LIPITOR ) 40 MG tablet Take 40 mg by mouth at bedtime. 02/07/24  Yes [provider]  cilostazol  (PLETAL ) 100 MG tablet Take 100 mg by mouth 2 (two) times daily. 02/07/24  Yes [provider]  clopidogrel  (PLAVIX ) 75 MG tablet Take 1 tablet (75 mg total) by mouth in the morning. 04/17/24  Yes Gretta Lonni PARAS, MD  insulin  glargine (LANTUS ) 100 UNIT/ML injection Inject 0.1 mLs (10 Units total) into the skin at bedtime. Patient taking differently: Inject 24 Units into the skin at bedtime. 12/11/23  Yes Johnson, Clanford L, MD  lidocaine  (LIDODERM ) 5 % Place 1 patch onto the skin daily. 05/05/24  Yes [provider]  magnesium  oxide (MAG-OX) 400 MG tablet Take 400 mg by mouth 2 (two) times daily. 12/04/23  Yes [provider]  metoprolol  succinate (TOPROL -XL) 50 MG 24 hr tablet Take 0.5 tablets (25 mg total) by mouth daily. 03/23/24  Yes Emokpae, Courage, MD  vitamin B-12 (CYANOCOBALAMIN ) 1000 MCG tablet Take 1,000 mcg by mouth in the morning.   Yes [provider]  amoxicillin -clavulanate (AUGMENTIN ) 875-125 MG tablet Take 1 tablet by mouth 2 (two) times daily. Patient not taking: Reported on 05/13/2024 05/05/24   [provider]  doxycycline  (VIBRA -TABS) 100 MG tablet Take 100 mg by mouth 2 (two) times daily. Patient not taking: Reported on 05/13/2024 05/05/24   [provider]   pantoprazole  (PROTONIX ) 40 MG tablet Take 1 tablet (40 mg total) by mouth daily. 03/23/24   Pearlean Manus, MD  pregabalin  (LYRICA ) 75 MG capsule Take 75 mg by mouth 2 (two) times daily. Patient not taking: Reported on 05/13/2024 01/22/24   [provider]     The patient is critically ill due to acute hypoxic respiratory failure requiring mechanical ventilation.  Critical care was necessary to treat or prevent imminent or life-threatening deterioration. Critical care time was spent by me on the following activities: development of a treatment plan with the patient and/or surrogate as well as nursing, discussions with consultants, evaluation of the patient's response to treatment, examination of the patient, obtaining a history from the patient or surrogate, ordering and performing treatments and interventions, ordering and review of laboratory studies, ordering and review of radiographic studies, review of telemetry data including pulse oximetry, re-evaluation of patient's condition and participation in multidisciplinary rounds.   I personally spent 34 minutes providing critical care not including any separately billable procedures.   Zola LOISE Herter, MD [facility] 05/22/2024 8:35 AM    05/22/2024, 8:35 AM

## 2024-05-22 NOTE — Progress Notes (Addendum)
 eLink Physician-Brief Progress Note Patient Name: [name] DOB: 20-Apr-1946 MRN: [id number]   Date of Service  05/22/2024  HPI/Events of Note  Difficult to achieve RASS goal with propofol  and as needed fentanyl   eICU Interventions  Add as needed Versed  as needed   2129 -continue restraints for patient safety  Intervention Category Minor Interventions: Agitation / anxiety - evaluation and management  [name] 05/22/2024, 8:07 PM

## 2024-05-22 NOTE — Progress Notes (Signed)
 SLP Cancellation Note  Patient Details Name: SOPHIA CUBERO MRN: [id number] DOB: 21-Oct-1945   Cancelled treatment:       Reason Eval/Treat Not Completed: Patient not medically ready (remains on vent as of this am). SLP to s/o and will await new orders once ready.     Leita SAILOR., M.A. CCC-SLP Acute Rehabilitation Services Office: [phone]  Secure chat preferred  05/22/2024, 8:13 AM

## 2024-05-23 ENCOUNTER — Inpatient Hospital Stay (HOSPITAL_COMMUNITY)

## 2024-05-23 DIAGNOSIS — I469 Cardiac arrest, cause unspecified: Secondary | ICD-10-CM | POA: Diagnosis not present

## 2024-05-23 DIAGNOSIS — J9601 Acute respiratory failure with hypoxia: Secondary | ICD-10-CM | POA: Diagnosis not present

## 2024-05-23 DIAGNOSIS — J810 Acute pulmonary edema: Secondary | ICD-10-CM | POA: Diagnosis not present

## 2024-05-23 DIAGNOSIS — R570 Cardiogenic shock: Secondary | ICD-10-CM | POA: Diagnosis not present

## 2024-05-23 LAB — GLUCOSE, CAPILLARY
Glucose-Capillary: >600 mg/dL (ref 70–99)
Glucose-Capillary: 207 mg/dL — ABNORMAL HIGH (ref 70–99)
Glucose-Capillary: 197 mg/dL — ABNORMAL HIGH (ref 70–99)
Glucose-Capillary: 188 mg/dL — ABNORMAL HIGH (ref 70–99)
Glucose-Capillary: 128 mg/dL — ABNORMAL HIGH (ref 70–99)
Glucose-Capillary: 139 mg/dL — ABNORMAL HIGH (ref 70–99)
Glucose-Capillary: 213 mg/dL — ABNORMAL HIGH (ref 70–99)

## 2024-05-23 LAB — RENAL FUNCTION PANEL
Albumin: 2.1 g/dL — ABNORMAL LOW (ref 3.5–5.0)
Anion gap: 11 (ref 5–15)
BUN: 38 mg/dL — ABNORMAL HIGH (ref 8–23)
CO2: 20 mmol/L — ABNORMAL LOW (ref 22–32)
Calcium: 8.4 mg/dL — ABNORMAL LOW (ref 8.9–10.3)
Chloride: 109 mmol/L (ref 98–111)
Creatinine, Ser: 2.11 mg/dL — ABNORMAL HIGH (ref 0.61–1.24)
GFR, Estimated: 32 mL/min — ABNORMAL LOW (ref >60)
Glucose, Bld: 202 mg/dL — ABNORMAL HIGH (ref 70–99)
Phosphorus: 4.2 mg/dL (ref 2.5–4.6)
Potassium: 3.4 mmol/L — ABNORMAL LOW (ref 3.5–5.1)
Sodium: 140 mmol/L (ref 135–145)

## 2024-05-23 LAB — POCT I-STAT 7, (LYTES, BLD GAS, ICA,H+H)
Acid-base deficit: 6 mmol/L — ABNORMAL HIGH (ref 0.0–2.0)
Bicarbonate: 20.8 mmol/L (ref 20.0–28.0)
Calcium, Ion: 1.27 mmol/L (ref 1.15–1.40)
HCT: 23 % — ABNORMAL LOW (ref 39.0–52.0)
Hemoglobin: 7.8 g/dL — ABNORMAL LOW (ref 13.0–17.0)
O2 Saturation: 96 %
Patient temperature: 37.3
Potassium: 4 mmol/L (ref 3.5–5.1)
Sodium: 143 mmol/L (ref 135–145)
TCO2: 22 mmol/L (ref 22–32)
pCO2 arterial: 46.1 mmHg (ref 32–48)
pH, Arterial: 7.265 — ABNORMAL LOW (ref 7.35–7.45)
pO2, Arterial: 93 mmHg (ref 83–108)

## 2024-05-23 LAB — MAGNESIUM: Magnesium: 2.2 mg/dL (ref 1.7–2.4)

## 2024-05-23 LAB — TRIGLYCERIDES: Triglycerides: 180 mg/dL — ABNORMAL HIGH (ref <150)

## 2024-05-23 MED ORDER — POTASSIUM CHLORIDE 10 MEQ/50ML IV SOLN
10.0000 meq | INTRAVENOUS | Status: AC
  Administered 2024-05-23 (×4): 10 meq via INTRAVENOUS
  Filled 2024-05-23 (×4): qty 50

## 2024-05-23 MED ORDER — HEPARIN SODIUM (PORCINE) 5000 UNIT/ML IJ SOLN
5000.0000 [IU] | Freq: Three times a day (TID) | INTRAMUSCULAR | Status: DC
Start: 2024-05-23 — End: 2024-05-24
  Administered 2024-05-23 – 2024-05-24 (×4): 5000 [IU] via SUBCUTANEOUS
  Filled 2024-05-23 (×4): qty 1

## 2024-05-23 MED ORDER — ASPIRIN 300 MG RE SUPP
150.0000 mg | Freq: Every day | RECTAL | Status: DC
  Administered 2024-05-23: 150 mg via RECTAL
  Filled 2024-05-23 (×2): qty 1

## 2024-05-23 MED ORDER — TRACE MINERALS CU-MN-SE-ZN 300-55-60-3000 MCG/ML IV SOLN
INTRAVENOUS | Status: DC
  Filled 2024-05-23: qty 537.6

## 2024-05-23 MED ORDER — MIDAZOLAM HCL 2 MG/2ML IJ SOLN
1.0000 mg | INTRAMUSCULAR | Status: DC | PRN
  Administered 2024-05-24: 2 mg via INTRAVENOUS
  Administered 2024-05-24: 1 mg via INTRAVENOUS
  Filled 2024-05-23 (×2): qty 2

## 2024-05-23 MED ORDER — CHLORHEXIDINE GLUCONATE CLOTH 2 % EX PADS
6.0000 | MEDICATED_PAD | CUTANEOUS | Status: DC
  Administered 2024-05-23: 6 via TOPICAL

## 2024-05-23 NOTE — Progress Notes (Signed)
 NAME:  [name], MRN:  [id number], DOB:  1946/06/14, LOS: 10 ADMISSION DATE:  05/13/2024 CHIEF COMPLAINT:  Arrest.    History of Present Illness:  78 y/o male with PMH for CAD status post CABG, DM type 2, HTN, hyperlipidemia, CKD stage IV, COPD, PAD, atrial tachycardia, diverticulitis status post colostomy and subsequent takedown, TIA who presented initially from [facility] with generalized weakness, confusion, difficulty ambulating and decreased oral intake.  He has had several episodes of pneumonia and has been deteriorating since May 2025.  He was treated for CAP in August at which time he also was noted to have acute on chronic renal failure.  CT head negative on admission.  Her also had episodic delirium.   Review of the medical record shows the patient has had enhancing lesions in his lumbar and thoracic spine on MRI (12/07/23).  PET scan (12/27/23) also showed Multifocal mild areas of abnormal uptake along the skeleton including several ribs, vertebral bodies and scattered along the pelvis. Some of these lesions are associated with areas of bony sclerosis.  He had separate T9 and L3 bone biopsies without any metastatic carcinoma identified.  He has seen medical oncology and has undergone extensive workup including serum immunoglobulins and SPEP which have been negative.  In addition, tumor markers CEA, CA 19-9, alpha-fetoprotein, and PSA were all unremarkable. During this hospitalization, wife stated she has noticed some shaking in his right arm and right chest intermittently.    This evening he was on and he c/o back pain, took his O2 off and then went into pulseless V tach and then PEA.  Code blue called and CPR started.  Initially lost his pulse and then regained only to lose pulse again then regained.  He was subsequently transferred to 2M11.  Pertinent  Medical History   CAD status post CABG, DM type 2, HTN, hyperlipidemia, CKD stage IV, COPD, PAD, atrial tachycardia, diverticulitis status  post colostomy and subsequent takedown, TIA   Interim History / Subjective:  Currently sedated and intubated.  Opening eyes while on sedation.  Seem to recognize his wife's voice.  Significant Hospital Events: 10/4 transfer to ICU 10/4 Code blue  Objective    Blood pressure 92/81, pulse 87, temperature 98.4 F (36.9 C), resp. rate 15, height 5' 7 (1.702 m), weight 63.5 kg, SpO2 98%.    Vent Mode: PSV;CPAP FiO2 (%):  [40 %] 40 % Set Rate:  [15 bmp-17 bmp] 16 bmp Vt Set:  [370 mL-530 mL] 370 mL PEEP:  [5 cmH20] 5 cmH20 Pressure Support:  [5 cmH20] 5 cmH20 Plateau Pressure:  [11 cmH20-23 cmH20] 18 cmH20   Intake/Output Summary (Last 24 hours) at 05/23/2024 0829 Last data filed at 05/23/2024 0700 Gross per 24 hour  Intake 1895.69 ml  Output 815 ml  Net 1080.69 ml   Filed Weights   05/21/24 0405 05/22/24 0500 05/23/24 0620  Weight: 62.2 kg 62.2 kg 63.5 kg    Examination: General: Remains agitated when sedation is weaned, not following commands, not alert Lungs: Coarse vent sounds Heart: More tachycardic when agitated, regular rate rhythm when sedated Abdomen: Soft, nontender slightly distended Neuro: Less alert today, moves all extremities  Assessment and Plan  Presented to the hospital on 9/30 for generalized weakness confusion, decreased oral intake and difficulty ambulating.  Patient was admitted to hospitalist service.  Hospital admission in August 2025 for CAP.  He was being treated on the hospital floor for acute on chronic renal failure, metabolic encephalopathy and parotitis  with antibiotics. Overnight on 10/4 had V. tach subsequently led to cardiac arrest, received 1 of epi, no shock.  During first pulse check was PEA arrest.   Goals of care discussion with family today  Discussed with family today, regarding the overall status of Mr Ryner, he has had multiple admissions to the hospital this year, he suffered a cardiac arrest during this hospital stay, he has had  issues with GI bleeding, and now he has altered mentation with inability to extubate due his AMS. He can't tolerate tube feeds due to his recent GI bleed and clipping and will need TPN through a central line. Family would like to continue trying to safely extubate him at this time and they are ok with TPN through a central line. They expressed that they care about his overall quality of life and would not want him to have a trach if he needs it should he not be able to get off the mechanical ventilation. For now, we will continue with daily awakening and spontaneous breathing trials.  Multiple discussions with the family regarding overall guarded prognosis with poor mental status that is not improving or very slowly improving   #Acute hypoxic respiratory failure, currently barrier to extubation is AMS  - Switch prop to dex  - Keep on fent Daily awakening trials and SBTs   #Toxic metabolic encphalopathy  Repeat Head CT negative Brain MRI on 10/3 motion degraded but no large abnormality noted  -No improvement in mental status, Plan for brain MRI today    V. tach leading to cardiac arrest- ?  V. tach arrest versus PEA arrest: - Never received shock. - EKG postcardiac arrest with ST wave depression in lateral leads. - Troponin minimally elevated.  Will recheck.  BNP 1200. - Lactic acid 2.8> 1.1. - Echo: postcardiac arrest: EF normal with grade 1 diastolic dysfunction. - Will get lower extremity venous Dopplers. - Cardiology consulted.  Cardiogenic shock: - Postcardiac arrest. - off levophed  - Also contributed by sedation.  Monitor.   Pulmonary edema: Acute hypoxic respiratory failure: - Reportedly was hypoxic prior to cardiac arrest. - Vent bundle. - tolerated SBT today but barrier to extubation is mentation  - Hold further diuresis as below.  Shock liver (improving) : - Will trend LFTs.   CGE: Suspected UGIB: - IV PPI twice daily. - Resume DVT prophylaxis.  Continue aspirin   and hold Plavix  and monitor hemoglobin closely with recent femorofemoral bypass. - Trend hemoglobin. Unable to obtain enteral access.  GI - No further evidence of bleeding, start heparin  for DVT ppx and aspirin   Acute metabolic encephalopathy: - Currently sedated.  Neurology following. - MRI motion degraded but negative. - EEG negative.  AKI on CKD: - Baseline 1.9-2.2. -Monitor.  Minimally elevated postcardiac arrest. -Was being given IV fluids before. -Hold diuresis for now. - Replete potassium   Failure to thrive: - Nutrition consult. Started on TPN through a PICC line   Possible seizures: -EEG negative for seizures. - Neurology following.  Parotitis: - Was started on Unasyn and Zyvox by hospitalist service. Completed both   Peripheral arterial disease: - Status post femorofemoral bypass - 04/17/2024--angioplasty right common iliac and external iliac arteries--Dr. Gretta    C6 spinous process fracture -per neurosurgery--continue operative treatment; can use cervical collar if significant pain  Hypertension: Hyperlipidemia: -Will resume meds as appropriate.  DM 2: - SSI.  Constipation Start docusate supp  Moderate Malnutrition Patient with no oral intake for 8 days. Can't obtain enteral access with  GI bleeds  Discussed with family and PICC line was placed and he was started on TPN     Labs   CBC: Recent Labs  Lab 05/18/24 1253 05/18/24 1902 05/19/24 0930 05/20/24 0541 05/22/24 1008  WBC 15.3* 12.8* 10.1 10.6* 11.9*  NEUTROABS  --   --   --   --  10.3*  HGB 6.2* 7.0* 7.8* 7.4* 7.6*  HCT 20.4* 22.6* 25.1* 23.8* 25.1*  MCV 82.6 83.4 83.9 84.7 85.7  PLT 333 275 263 239 197    Basic Metabolic Panel: Recent Labs  Lab 05/17/24 0517 05/17/24 2320 05/18/24 0018 05/19/24 0930 05/20/24 0541 05/21/24 0356 05/22/24 0640 05/23/24 0420  NA 140 140   < > 139 141 140 140 140  K 3.9 4.1   < > 3.6 3.3* 3.8 3.8 3.4*  CL 105 109  --  108 109 110 111 109   CO2 16* 13*  --  20* 20* 16* 17* 20*  GLUCOSE 72 143*  --  109* 81 75 193* 202*  BUN 36* 37*  --  40* 36* 35* 34* 38*  CREATININE 2.59* 2.65*  --  2.81* 2.78* 2.77* 2.29* 2.11*  CALCIUM  9.5 8.8*  --  8.0* 8.2* 7.9* 8.1* 8.4*  MG 2.0 2.0  --   --   --  1.9 2.0 2.2  PHOS  --   --   --   --   --  4.7* 4.1 4.2   < > = values in this interval not displayed.   GFR: Estimated Creatinine Clearance: 26.8 mL/min (A) (by C-G formula based on SCr of 2.11 mg/dL (H)). Recent Labs  Lab 05/17/24 2320 05/18/24 0243 05/18/24 1253 05/18/24 1902 05/19/24 0930 05/20/24 0541 05/22/24 1008  WBC 20.0*  --    < > 12.8* 10.1 10.6* 11.9*  LATICACIDVEN 2.8* 1.1  --   --   --   --   --    < > = values in this interval not displayed.    Liver Function Tests: Recent Labs  Lab 05/17/24 2320 05/19/24 0930 05/20/24 0925 05/21/24 0356 05/22/24 0640 05/23/24 0420  AST 475* 132* 68* 33 23  --   ALT 264* 159* 121* 75* 56*  --   ALKPHOS 102 85 95 74 74  --   BILITOT 1.0 0.7 0.8 1.0 0.7  --   PROT 6.4* 5.6* 6.4* 5.2* 5.1*  --   ALBUMIN  2.5* 2.2* 2.4* 1.9* 1.9* 2.1*   No results for input(s): LIPASE, AMYLASE in the last 168 hours. Recent Labs  Lab 05/17/24 0517  AMMONIA <13    ABG    Component Value Date/Time   PHART 7.356 05/18/2024 0018   PCO2ART 26.3 (L) 05/18/2024 0018   PO2ART 72 (L) 05/18/2024 0018   HCO3 15.0 (L) 05/18/2024 0018   TCO2 16 (L) 05/18/2024 0018   ACIDBASEDEF 10.0 (H) 05/18/2024 0018   O2SAT 95 05/18/2024 0018      Past Medical History:  He,  has a past medical history of Anemia, CAD S/P percutaneous coronary angioplasty (1998), CKD (chronic kidney disease) stage 3, GFR 30-59 ml/min (HCC), Diabetes mellitus without complication (HCC), Diverticulitis (08/16/2017), GERD (gastroesophageal reflux disease), CABG (10/2015), Hypercholesteremia, Hypertension, Neuropathy, Peripheral vascular disease, Pneumonia, and Tachycardia.   Surgical History:   Past Surgical History:   Procedure Laterality Date   ABDOMINAL AORTOGRAM Bilateral 09/19/2016   Procedure: iliac;  Surgeon: Gaile LELON New, MD;  Location: [facility] INVASIVE CV LAB;  Service: Cardiovascular;  Laterality: Bilateral;  ABDOMINAL AORTOGRAM N/A 04/17/2024   Procedure: ABDOMINAL AORTOGRAM;  Surgeon: Gretta Lonni PARAS, MD;  Location: [facility] INVASIVE CV LAB;  Service: Cardiovascular;  Laterality: N/A;   ABDOMINAL AORTOGRAM W/LOWER EXTREMITY N/A 09/22/2021   Procedure: ABDOMINAL AORTOGRAM W/LOWER EXTREMITY;  Surgeon: Gretta Lonni PARAS, MD;  Location: [facility] CV LAB;  Service: Cardiovascular;  Laterality: N/A;   ABDOMINAL AORTOGRAM W/LOWER EXTREMITY N/A 10/05/2022   Procedure: ABDOMINAL AORTOGRAM W/LOWER EXTREMITY;  Surgeon: Gretta Lonni PARAS, MD;  Location: [facility] CV LAB;  Service: Cardiovascular;  Laterality: N/A;   BIOPSY  05/16/2023   Procedure: BIOPSY;  Surgeon: Shaaron Lamar HERO, MD;  Location: [facility];  Service: Endoscopy;;   CARDIAC CATHETERIZATION  2003   with stent   CARDIAC CATHETERIZATION N/A 10/11/2015   Procedure: Left Heart Cath and Coronary Angiography;  Surgeon: Peter M Swaziland, MD;  Location: [facility] INVASIVE CV LAB;  Service: Cardiovascular;  Laterality: N/A;   COLON RESECTION N/A 12/13/2017   Procedure: EXPLORATORY LAPAROTOMY, SIGMOID COLECTOMY WITH COLOSTOMY;  Surgeon: Belinda Cough, MD;  Location: [facility] OR;  Service: General;  Laterality: N/A;   COLONOSCOPY N/A 09/22/2013   Procedure: COLONOSCOPY;  Surgeon: Lamar HERO Shaaron, MD;  [facility]: [facility];  Service: Endoscopy;  Laterality: N/A;  9:30 AM   COLONOSCOPY N/A 02/19/2018   Procedure: COLONOSCOPY;  Surgeon: Shaaron Lamar HERO, MD;  Location: [facility];  Service: Endoscopy;  Laterality: N/A;  2:00pm   COLONOSCOPY WITH PROPOFOL  N/A 05/16/2023   Procedure: COLONOSCOPY WITH PROPOFOL ;  Surgeon: Shaaron Lamar HERO, MD;  [facility]: [facility];  Service: Endoscopy;  Laterality: N/A;  730am, asa 3   COLOSTOMY N/A 12/13/2017   Procedure:  COLOSTOMY;  Surgeon: Belinda Cough, MD;  Location: [facility] OR;  Service: General;  Laterality: N/A;   COLOSTOMY REVERSAL N/A 03/26/2018   Procedure: COLOSTOMY REVERSAL;  Surgeon: Belinda Cough, MD;  Location: [facility] OR;  Service: General;  Laterality: N/A;   CORONARY ARTERY BYPASS GRAFT N/A 10/18/2015   Procedure: CORONARY ARTERY BYPASS GRAFTING (CABG) x  five, using left internal mammary artery and right leg greater saphenous vein harvested endoscopically;  Surgeon: Elspeth JAYSON Millers, MD;  Location: [facility] OR;  Service: Open Heart Surgery;  Laterality: N/A;   ENDARTERECTOMY FEMORAL Right 08/09/2016   Procedure: ENDARTERECTOMY FEMORAL WITH VEIN PATCH ANGIOPLASTY;  Surgeon: Gaile LELON New, MD;  Location: [facility] OR;  Service: Vascular;  Laterality: Right;   ESOPHAGOGASTRODUODENOSCOPY N/A 10/24/2017   Dr. Shaaron: hiatal hernia   ESOPHAGOGASTRODUODENOSCOPY N/A 01/01/2024   Procedure: EGD (ESOPHAGOGASTRODUODENOSCOPY);  Surgeon: Cinderella Deatrice FALCON, MD;  [facility]: [facility];  Service: Endoscopy;  Laterality: N/A;   ESOPHAGOGASTRODUODENOSCOPY N/A 05/19/2024   Procedure: EGD (ESOPHAGOGASTRODUODENOSCOPY);  Surgeon: Leigh Elspeth SQUIBB, MD;  Location: [facility] ENDOSCOPY;  Service: Gastroenterology;  Laterality: N/A;   ESOPHAGOGASTRODUODENOSCOPY (EGD) WITH PROPOFOL  N/A 05/16/2023   Procedure: ESOPHAGOGASTRODUODENOSCOPY (EGD) WITH PROPOFOL ;  Surgeon: Shaaron Lamar HERO, MD;  [facility];  Service: Endoscopy;  Laterality: N/A;   FEMORAL-FEMORAL BYPASS GRAFT Bilateral 08/09/2016   Procedure: REVISION BYPASS GRAFT RIGHT FEMORAL-LEFT FEMORAL ARTERY;  Surgeon: Gaile LELON New, MD;  Location: [facility] OR;  Service: Vascular;  Laterality: Bilateral;   FEMORAL-POPLITEAL BYPASS GRAFT     INTRAMEDULLARY (IM) NAIL INTERTROCHANTERIC Left 04/03/2021   Procedure: LEFT  HIP INTRAMEDULLARY (IM) NAIL INTERTROCHANTRIC;  Surgeon: Vernetta Lonni GRADE, MD;  Location: [facility] OR;  Service: Orthopedics;  Laterality: Left;   IR FLUORO GUIDED  NEEDLE PLC ASPIRATION/INJECTION LOC  12/26/2023   IR IVC FILTER RETRIEVAL /  S&I /IMG GUID/MOD SED  01/15/2024   LOWER EXTREMITY ANGIOGRAPHY N/A 04/17/2024   Procedure: Lower Extremity Angiography;  Surgeon: Gretta Lonni PARAS, MD;  Location: [facility] INVASIVE CV LAB;  Service: Cardiovascular;  Laterality: N/A;   LOWER EXTREMITY INTERVENTION N/A 04/17/2024   Procedure: LOWER EXTREMITY INTERVENTION;  Surgeon: Gretta Lonni PARAS, MD;  Location: [facility];  Service: Cardiovascular;  Laterality: N/A;   MALONEY DILATION N/A 05/16/2023   Procedure: AGAPITO DILATION;  Surgeon: Shaaron Lamar HERO, MD;  Location: [facility];  Service: Endoscopy;  Laterality: N/A;   PERIPHERAL VASCULAR BALLOON ANGIOPLASTY Right 09/22/2021   Procedure: PERIPHERAL VASCULAR BALLOON ANGIOPLASTY;  Surgeon: Gretta Lonni PARAS, MD;  [facility]: [facility];  Service: Cardiovascular;  Laterality: Right;   PERIPHERAL VASCULAR CATHETERIZATION N/A 05/08/2016   Procedure: Lower Extremity Angiography;  Surgeon: Dorn PARAS Lesches, MD;  Location: [facility] INVASIVE CV LAB;  Service: Cardiovascular;  Laterality: N/A;   PERIPHERAL VASCULAR INTERVENTION Right 09/19/2016   Procedure: Peripheral Vascular Intervention;  Surgeon: Gaile LELON New, MD;  [facility]: [facility];  Service: Cardiovascular;  Laterality: Right;  ext iliac stent   PERIPHERAL VASCULAR INTERVENTION Right 09/22/2021   Procedure: PERIPHERAL VASCULAR INTERVENTION;  Surgeon: Gretta Lonni PARAS, MD;  Location: [facility];  Service: Cardiovascular;  Laterality: Right;   POLYPECTOMY  02/19/2018   Procedure: POLYPECTOMY;  Surgeon: Shaaron Lamar HERO, MD;  [facility];  Service: Endoscopy;;   PROCTOSCOPY  10/24/2017   Procedure: PROCTOSCOPY;  Surgeon: Shaaron Lamar HERO, MD;  [facility];  Service: Endoscopy;;   TEE WITHOUT CARDIOVERSION N/A 10/18/2015   Procedure: TRANSESOPHAGEAL ECHOCARDIOGRAM (TEE);  Surgeon: Elspeth JAYSON Millers, MD;   Location: [facility] OR;  Service: Open Heart Surgery;  Laterality: N/A;     Social History:   reports that he quit smoking about 23 years ago. His smoking use included cigarettes. He started smoking about 53 years ago. He has a 60 pack-year smoking history. He has never used smokeless tobacco. He reports that he does not drink alcohol  and does not use drugs.   Family History:  His family history includes Colon cancer in his brother; Heart attack in his mother; Stroke in his mother.   Allergies Allergies  Allergen Reactions   Baclofen Other (See Comments)    Altered mental status, extreme drowsiness   Acarbose Diarrhea     Home Medications  Prior to Admission medications   Medication Sig Start Date End Date Taking? Authorizing Provider  aspirin  EC 81 MG tablet Take 1 tablet (81 mg total) by mouth daily with breakfast. 05/23/19  Yes Emokpae, Courage, MD  atorvastatin  (LIPITOR ) 40 MG tablet Take 40 mg by mouth at bedtime. 02/07/24  Yes [provider]  cilostazol  (PLETAL ) 100 MG tablet Take 100 mg by mouth 2 (two) times daily. 02/07/24  Yes [provider]  clopidogrel  (PLAVIX ) 75 MG tablet Take 1 tablet (75 mg total) by mouth in the morning. 04/17/24  Yes Gretta Lonni PARAS, MD  insulin  glargine (LANTUS ) 100 UNIT/ML injection Inject 0.1 mLs (10 Units total) into the skin at bedtime. Patient taking differently: Inject 24 Units into the skin at bedtime. 12/11/23  Yes Johnson, Clanford L, MD  lidocaine  (LIDODERM ) 5 % Place 1 patch onto the skin daily. 05/05/24  Yes [provider]  magnesium  oxide (MAG-OX) 400 MG tablet Take 400 mg by mouth 2 (two) times daily. 12/04/23  Yes [provider]  metoprolol  succinate (TOPROL -XL) 50 MG 24 hr  tablet Take 0.5 tablets (25 mg total) by mouth daily. 03/23/24  Yes Emokpae, Courage, MD  vitamin B-12 (CYANOCOBALAMIN ) 1000 MCG tablet Take 1,000 mcg by mouth in the morning.   Yes [provider]  amoxicillin -clavulanate  (AUGMENTIN ) 875-125 MG tablet Take 1 tablet by mouth 2 (two) times daily. Patient not taking: Reported on 05/13/2024 05/05/24   [provider]  doxycycline  (VIBRA -TABS) 100 MG tablet Take 100 mg by mouth 2 (two) times daily. Patient not taking: Reported on 05/13/2024 05/05/24   [provider]  pantoprazole  (PROTONIX ) 40 MG tablet Take 1 tablet (40 mg total) by mouth daily. 03/23/24   Pearlean Manus, MD  pregabalin  (LYRICA ) 75 MG capsule Take 75 mg by mouth 2 (two) times daily. Patient not taking: Reported on 05/13/2024 01/22/24   [provider]     The patient is critically ill due to acute hypoxic respiratory failure requiring mechanical ventilation.  Critical care was necessary to treat or prevent imminent or life-threatening deterioration. Critical care time was spent by me on the following activities: development of a treatment plan with the patient and/or surrogate as well as nursing, discussions with consultants, evaluation of the patient's response to treatment, examination of the patient, obtaining a history from the patient or surrogate, ordering and performing treatments and interventions, ordering and review of laboratory studies, ordering and review of radiographic studies, review of telemetry data including pulse oximetry, re-evaluation of patient's condition and participation in multidisciplinary rounds.   I personally spent 32 minutes providing critical care not including any separately billable procedures.   Zola LOISE Herter, MD [place] [facility] 05/23/2024 8:29 AM    05/23/2024, 8:29 AM

## 2024-05-23 NOTE — Plan of Care (Signed)
  Problem: Education: Goal: Knowledge of General Education information will improve Description: Including pain rating scale, medication(s)/side effects and non-pharmacologic comfort measures Outcome: Progressing   Problem: Health Behavior/Discharge Planning: Goal: Ability to manage health-related needs will improve Outcome: Progressing   Problem: Clinical Measurements: Goal: Ability to maintain clinical measurements within normal limits will improve Outcome: Progressing Goal: Will remain free from infection Outcome: Progressing Goal: Diagnostic test results will improve Outcome: Progressing Goal: Respiratory complications will improve Outcome: Progressing Goal: Cardiovascular complication will be avoided Outcome: Progressing   Problem: Activity: Goal: Risk for activity intolerance will decrease Outcome: Progressing   Problem: Nutrition: Goal: Adequate nutrition will be maintained Outcome: Progressing   Problem: Coping: Goal: Level of anxiety will decrease Outcome: Progressing   Problem: Elimination: Goal: Will not experience complications related to bowel motility Outcome: Progressing Goal: Will not experience complications related to urinary retention Outcome: Progressing   Problem: Pain Managment: Goal: General experience of comfort will improve and/or be controlled Outcome: Progressing   Problem: Safety: Goal: Ability to remain free from injury will improve Outcome: Progressing   Problem: Skin Integrity: Goal: Risk for impaired skin integrity will decrease Outcome: Progressing   Problem: Education: Goal: Ability to describe self-care measures that may prevent or decrease complications (Diabetes Survival Skills Education) will improve Outcome: Progressing Goal: Individualized Educational Video(s) Outcome: Progressing   Problem: Fluid Volume: Goal: Ability to maintain a balanced intake and output will improve Outcome: Progressing   Problem: Health  Behavior/Discharge Planning: Goal: Ability to identify and utilize available resources and services will improve Outcome: Progressing Goal: Ability to manage health-related needs will improve Outcome: Progressing   Problem: Nutritional: Goal: Maintenance of adequate nutrition will improve Outcome: Progressing Goal: Progress toward achieving an optimal weight will improve Outcome: Progressing   Problem: Metabolic: Goal: Ability to maintain appropriate glucose levels will improve Outcome: Progressing   Problem: Skin Integrity: Goal: Risk for impaired skin integrity will decrease Outcome: Progressing   Problem: Tissue Perfusion: Goal: Adequacy of tissue perfusion will improve Outcome: Progressing   Problem: Activity: Goal: Ability to tolerate increased activity will improve Outcome: Progressing   Problem: Respiratory: Goal: Ability to maintain a clear airway and adequate ventilation will improve Outcome: Progressing   Problem: Role Relationship: Goal: Method of communication will improve Outcome: Progressing   Problem: Safety: Goal: Non-violent Restraint(s) Outcome: Progressing

## 2024-05-23 NOTE — Plan of Care (Signed)
  Problem: Elimination: Goal: Will not experience complications related to bowel motility Outcome: Progressing   Problem: Pain Managment: Goal: General experience of comfort will improve and/or be controlled Outcome: Progressing   Problem: Safety: Goal: Ability to remain free from injury will improve Outcome: Progressing   Problem: Skin Integrity: Goal: Risk for impaired skin integrity will decrease Outcome: Progressing   Problem: Respiratory: Goal: Ability to maintain a clear airway and adequate ventilation will improve Outcome: Progressing   Problem: Safety: Goal: Non-violent Restraint(s) Outcome: Progressing

## 2024-05-23 NOTE — Progress Notes (Signed)
 Nutrition Follow-up  DOCUMENTATION CODES:  Severe malnutrition in context of chronic illness  INTERVENTION:  Per GI, pt should not have a tube placed in esophagus for several weeks. Also recommends on holding on solid food for awhile but if able to be extubated and tolerates clear liquids could be advanced to full liquids.  Continue TPN.  Management per pharmacy team Pt will be at risk for refeeding, recommend thiamine x 5 days while TPN is being titrated to goal.  Nutrition recommendations are found below Monitor GOC discussion, PMT following to help family with GOC discussions and establishing pt's wishes  NUTRITION DIAGNOSIS:  Severe Malnutrition related to chronic illness (COPD) as evidenced by severe fat depletion, severe muscle depletion. - remains applicable  GOAL:  Patient will meet greater than or equal to 90% of their needs - progressing, TPN to be initiated  MONITOR:  Vent status, I & O's, Labs  REASON FOR ASSESSMENT:  Consult New TPN/TNA  ASSESSMENT:  Pt with history of coronary artery disease status post CABG, diabetes mellitus type 2, hypertension, hyperlipidemia, CKD stage IV, COPD, peripheral arterial disease, atrial tachycardia, diverticulitis status post colostomy and subsequent takedown, TIA presenting with generalized weakness, confusion, decreased oral intake, and difficulty ambulating.  9/30 - [name] 10[facility] - [name] and then later in the day had PEA cardiac arrest and transferred to [name], intubated 10/6 - EGD planned after pt with drop in Hgb and hx of esophagitis. Found to have severe esophagitis with stigmata of recent bleeding. 4 clips were placed   10/8 - TPN started, PICC placed  Pt resting in bed at the time of assessment. Remains intubated and sedated, plans for MRI today to evaluate if there has been a change since pt's cardiac arrest earlier this admission as his mental status is preventing him from being  extubated. TPN infusing with plans to hold at 50mL/h as propofol  is providing some kcal. Currently TPN rate providing 81g of protein. If unable to wean propofol  RPH planning to rebalance TPN.  Will continue to monitor GOC and overall trajectory of care.   Goal TPN rate is 62 mL/hr (provides 100 g of protein and 1897 kcals per day)   MV: 9.2 L/min Temp (24hrs), Avg:98.2 F (36.8 C), Min:97.3 F (36.3 C), Max:99.3 F (37.4 C) MAP (cuff): 65-87 mmHg this AM  Admit weight: 59 kg   Current weight: 63.5 kg    Intake/Output Summary (Last 24 hours) at 05/23/2024 1324 Last data filed at 05/23/2024 0800 Gross per 24 hour  Intake 1678.94 ml  Output 815 ml  Net 863.94 ml  Net IO Since Admission: 8,437.21 mL [05/23/24 1324]  Drains/Lines: PICC triple lumen  UOP 815 mL x 24 hours  Average Meal Intake: 10/3-10/4: 3% intake x 3 recorded meals prior to intubation  Nutritionally Relevant Medications: Scheduled Meds:  insulin  aspart  0-15 Units Subcutaneous Q4H   Pantoprazole  IV  40 mg Intravenous Q12H   Continuous Infusions:  potassium chloride  10 mEq (05/23/24 0848)   propofol  (DIPRIVAN ) infusion 30 mcg/kg/min (05/23/24 0700)   thiamine (VITAMIN B1) injection 250 mg (05/22/24 1204)   TPN ADULT (ION) 50 mL/hr at 05/23/24 0700   TPN ADULT (ION)     PRN Meds: prochlorperazine  Labs Reviewed: BUN 38, creatinine 2.11 CBG ranges from 158-207 mg/dL over the last 24 hours HgbA1c 9.0% (9/30)  NUTRITION - FOCUSED PHYSICAL EXAM: Flowsheet Row Most Recent Value  Orbital Region Moderate depletion  Upper Arm Region  Severe depletion  Thoracic and Lumbar Region Severe depletion  Buccal Region Mild depletion  Temple Region Moderate depletion  Clavicle Bone Region Mild depletion  Clavicle and Acromion Bone Region Severe depletion  Scapular Bone Region Severe depletion  Dorsal Hand No depletion  [edema]  Patellar Region Severe depletion  Anterior Thigh Region Severe depletion  Posterior  Calf Region Severe depletion  Edema (RD Assessment) Mild  [hands, lower arms]  Hair Reviewed  Eyes Reviewed  Mouth Reviewed  Skin Reviewed  Nails Reviewed    Diet Order:   Diet Order             Diet NPO time specified  Diet effective now                   EDUCATION NEEDS:  Not appropriate for education at this time  Skin:  Skin Assessment: Reviewed RN Assessment  Last BM:  10/8 - type 6  Height:  Ht Readings from Last 1 Encounters:  05/21/24 5' 7 (1.702 m)    Weight:  Wt Readings from Last 1 Encounters:  05/23/24 63.5 kg    Ideal Body Weight:  67.3 kg  BMI:  Body mass index is 21.93 kg/m.  Estimated Nutritional Needs:  Kcal:  1800-2000 kcal/d Protein:  90-105 grams Fluid:  1.8-2L/d    [name], RD, LDN, CNSC Registered Dietitian II Please reach out via secure chat

## 2024-05-23 NOTE — Progress Notes (Signed)
 Patient transported from 2M11 to MRI and back without complication. RN at bedside

## 2024-05-23 NOTE — Progress Notes (Addendum)
 PHARMACY - TOTAL PARENTERAL NUTRITION CONSULT NOTE   Indication: prolonged NPO status  Patient Measurements: Height: 5' 7 (170.2 cm) Weight: 63.5 kg (139 lb 15.9 oz) IBW/kg (Calculated) : 66.1 TPN AdjBW (KG): 62.6 Body mass index is 21.93 kg/m. Usual Weight: 65kg  Assessment:  78 yo M admitted to the ICU after cardiac arrest. Pt was found to have hemorrhagic esophagitis and is now s/p EGD with placement of 4 hemostatic clips. Per GI, will avoid NG/OG tube placement for several weeks d/t proceduralization. Attempting extubation in the coming days but pt has been without nutrition for >7d now (including some time PTA) and is not expected to meet caloric needs even after extubation for a prolonged time period d/t the necessary slow advancement of his diet per GI comorbidities.  Glucose / Insulin : A1c 9.0- CBG 200s; 19u SSI in 24 hours Electrolytes: K 3.4, CO2 20 CoCa 9.9, Mag 2.2, Phos 4.2  Renal: Scr 2.11 , BUN 38 Hepatic: LFTs ok - Alb 1.92.1 TG 180 Intake / Output; MIVF: UO 0.5 ml/kg/hr, 0 mL NGT  GI Imaging:  10/9 KUB mod stool in ascending colon, gaseous distention of descending colon  GI Surgeries / Procedures: none since TPN start  Central access: 05/21/2024 TPN start date: 05/21/2024  Nutritional Goals: Goal TPN rate is 62 mL/hr (provides 100 g of protein and 1897 kcals per day)  RD Assessment: Estimated Needs Total Energy Estimated Needs: 1800-2000 kcal/d Total Protein Estimated Needs: 90-105 grams Total Fluid Estimated Needs: 1.8-2L/d  Current Nutrition:  NPO Propofol  running at 30 mcg/kg/min currently (11.2 ml/hr) -- provides 295 kcal per day   Plan:  Continue concentrated TPN at 50 mL/hr at 1800 to provide 100% of needs on day 3 TPN including kcal provided from propofol  - goal to down-titrate propofol  in coming days . However, we may need re-balance of TPN to reduce lipids if unable to come off  Electrolytes in TPN: Na 50 mEq/L, K 35 mEq/L, Ca 2 mEq/L, Mg 10  mEq/L, and Phos 5 mmol/L. Max Acetate Add 12 u insulin  to the TPN  KCL x4 runs per MD this AM Continue standard MVI and trace elements to TPN Continue Moderate q4h SSI and adjust as needed  Monitor TPN labs on Mon/Thurs, daily until stable at goal  Sharyne Glatter, PharmD, BCCCP Critical Care Clinical Pharmacist 05/23/2024 8:23 AM

## 2024-05-24 ENCOUNTER — Inpatient Hospital Stay (HOSPITAL_COMMUNITY)

## 2024-05-24 DIAGNOSIS — Z7189 Other specified counseling: Secondary | ICD-10-CM | POA: Diagnosis not present

## 2024-05-24 DIAGNOSIS — Z515 Encounter for palliative care: Secondary | ICD-10-CM

## 2024-05-24 DIAGNOSIS — R570 Cardiogenic shock: Secondary | ICD-10-CM | POA: Diagnosis not present

## 2024-05-24 DIAGNOSIS — J9601 Acute respiratory failure with hypoxia: Secondary | ICD-10-CM | POA: Diagnosis not present

## 2024-05-24 DIAGNOSIS — I469 Cardiac arrest, cause unspecified: Secondary | ICD-10-CM | POA: Diagnosis not present

## 2024-05-24 DIAGNOSIS — N179 Acute kidney failure, unspecified: Secondary | ICD-10-CM | POA: Diagnosis not present

## 2024-05-24 DIAGNOSIS — J810 Acute pulmonary edema: Secondary | ICD-10-CM | POA: Diagnosis not present

## 2024-05-24 LAB — RENAL FUNCTION PANEL
Albumin: 2 g/dL — ABNORMAL LOW (ref 3.5–5.0)
Anion gap: 10 (ref 5–15)
BUN: 38 mg/dL — ABNORMAL HIGH (ref 8–23)
CO2: 18 mmol/L — ABNORMAL LOW (ref 22–32)
Calcium: 7.8 mg/dL — ABNORMAL LOW (ref 8.9–10.3)
Chloride: 114 mmol/L — ABNORMAL HIGH (ref 98–111)
Creatinine, Ser: 1.8 mg/dL — ABNORMAL HIGH (ref 0.61–1.24)
GFR, Estimated: 39 mL/min — ABNORMAL LOW (ref >60)
Glucose, Bld: 211 mg/dL — ABNORMAL HIGH (ref 70–99)
Phosphorus: 2.6 mg/dL (ref 2.5–4.6)
Potassium: 3.6 mmol/L (ref 3.5–5.1)
Sodium: 142 mmol/L (ref 135–145)

## 2024-05-24 LAB — TRIGLYCERIDES: Triglycerides: 109 mg/dL (ref <150)

## 2024-05-24 LAB — GLUCOSE, CAPILLARY
Glucose-Capillary: 161 mg/dL — ABNORMAL HIGH (ref 70–99)
Glucose-Capillary: 232 mg/dL — ABNORMAL HIGH (ref 70–99)

## 2024-05-24 LAB — MAGNESIUM: Magnesium: 2.1 mg/dL (ref 1.7–2.4)

## 2024-05-24 MED ORDER — GLYCOPYRROLATE 1 MG PO TABS: 1.0000 mg | ORAL_TABLET | ORAL | Status: DC | PRN

## 2024-05-24 MED ORDER — GLYCOPYRROLATE 0.2 MG/ML IJ SOLN: 0.2000 mg | INTRAMUSCULAR | Status: DC | PRN

## 2024-05-24 MED ORDER — GLYCOPYRROLATE 0.2 MG/ML IJ SOLN
0.2000 mg | INTRAMUSCULAR | Status: DC | PRN
  Administered 2024-05-24: 0.2 mg via INTRAVENOUS
  Filled 2024-05-24: qty 1

## 2024-05-24 MED ORDER — MIDAZOLAM-SODIUM CHLORIDE 100-0.9 MG/100ML-% IV SOLN
0.0000 mg/h | INTRAVENOUS | Status: DC
  Administered 2024-05-24: 1 mg/h via INTRAVENOUS
  Filled 2024-05-24: qty 100

## 2024-05-24 MED ORDER — VANCOMYCIN HCL 1250 MG/250ML IV SOLN
1250.0000 mg | Freq: Once | INTRAVENOUS | Status: DC
  Filled 2024-05-24: qty 250

## 2024-05-24 MED ORDER — ACETAMINOPHEN 325 MG PO TABS: 650.0000 mg | ORAL_TABLET | Freq: Four times a day (QID) | ORAL | Status: DC | PRN

## 2024-05-24 MED ORDER — FENTANYL 2500MCG IN NS 250ML (10MCG/ML) PREMIX INFUSION
0.0000 ug/h | INTRAVENOUS | Status: DC
  Administered 2024-05-24: 250 ug/h via INTRAVENOUS
  Filled 2024-05-24: qty 250

## 2024-05-24 MED ORDER — FENTANYL BOLUS VIA INFUSION: 100.0000 ug | INTRAVENOUS | Status: DC | PRN

## 2024-05-24 MED ORDER — MIDAZOLAM BOLUS VIA INFUSION (WITHDRAWAL LIFE SUSTAINING TX)
2.0000 mg | INTRAVENOUS | Status: DC | PRN
  Administered 2024-05-24: 2 mg via INTRAVENOUS

## 2024-05-24 MED ORDER — PIPERACILLIN-TAZOBACTAM 3.375 G IVPB: 3.3750 g | Freq: Three times a day (TID) | INTRAVENOUS | Status: DC

## 2024-05-24 MED ORDER — ACETAMINOPHEN 650 MG RE SUPP: 650.0000 mg | Freq: Four times a day (QID) | RECTAL | Status: DC | PRN

## 2024-05-24 MED ORDER — VANCOMYCIN HCL 750 MG/150ML IV SOLN: 750.0000 mg | INTRAVENOUS | Status: DC

## 2024-05-24 MED ORDER — POLYVINYL ALCOHOL 1.4 % OP SOLN: 1.0000 [drp] | Freq: Four times a day (QID) | OPHTHALMIC | Status: DC | PRN

## 2024-05-24 MED ORDER — MIDAZOLAM HCL 2 MG/2ML IJ SOLN: 1.0000 mg | INTRAMUSCULAR | Status: DC | PRN

## 2024-05-28 ENCOUNTER — Ambulatory Visit: Admitting: Internal Medicine

## 2024-05-28 MED FILL — Medication: Qty: 1 | Status: AC

## 2024-06-14 NOTE — Progress Notes (Signed)
 Brief progress note  Patient with worsening hypoxia and CXR showing new infiltrate, dicussed with family worsening clinical status and poor prognosis overall. Family elected to withdraw care and focus on his comfort. Code status changed to DNR-Comfort to reflect those wishes.   Zola Herter, MD [facility] Office: [phone]   See Amion for personal pager PCCM on call pager [phone] until 7pm. Please call Elink 7p-7a. [phone]

## 2024-06-14 NOTE — Discharge Summary (Signed)
 DEATH SUMMARY   Patient Details  Name: [name] MRN: [id number] DOB: September 27, 1945 ERE:Ropwpr, Bonni Lien  Admission/Discharge Information   Admit Date:  02-Jun-2024  Date of Death: Date of Death: Jun 13, 2024  Time of Death: Time of Death: 06/28/2007  Length of Stay: 06-13-2024   Principle Cause of death: acute hypoxic respiratory failure  Hospital Diagnoses: Principal Problem:   Acute renal failure superimposed on stage 4 chronic kidney disease (HCC) Active Problems:   Essential hypertension   Type 2 diabetes mellitus (HCC)   Upper GI bleed   Hypertension   Coronary artery disease involving native coronary artery of native heart without angina pectoris   Hx of CABG   Acute post-hemorrhagic anemia   Acute encephalopathy   Failure to thrive in adult   Cystitis   Antiplatelet or antithrombotic long-term use   Cardiac arrest (HCC)   Shock liver   Protein-calorie malnutrition, severe   Hospital Course: 78 y/o male with PMH for CAD status post CABG, DM type 2, HTN, hyperlipidemia, CKD stage IV, COPD, PAD, atrial tachycardia, diverticulitis status post colostomy and subsequent takedown, TIA who presented initially from [facility] with generalized weakness, confusion, difficulty ambulating and decreased oral intake.  He has had several episodes of pneumonia and has been deteriorating since May 2025.  He was treated for CAP in August at which time he also was noted to have acute on chronic renal failure.  CT head negative on admission.  Her also had episodic delirium.   Review of the medical record shows the patient has had enhancing lesions in his lumbar and thoracic spine on MRI (12/07/23).  PET scan (12/27/23) also showed Multifocal mild areas of abnormal uptake along the skeleton including several ribs, vertebral bodies and scattered along the pelvis. Some of these lesions are associated with areas of bony sclerosis.  He had separate T9 and L3 bone biopsies without any metastatic carcinoma identified.   He has seen medical oncology and has undergone extensive workup including serum immunoglobulins and SPEP which have been negative.  In addition, tumor markers CEA, CA 19-9, alpha-fetoprotein, and PSA were all unremarkable. During this hospitalization, wife stated she has noticed some shaking in his right arm and right chest intermittently.   He developed a cardiac arrest in the hospital and was transferred to the ICU. In the ICU he was treated for a pneumonia without much improvement in his encephalopathy. Despite daily weaning trials mentation was a barrier to extubation.Unfortunately he decompensated and had worsening hypoxia on the ventilator in the setting a of a suspected new VAP. Given the new deterioration, discussed with family members clinical deterioration and poor prognosis and family elected to transition his care to comfort measures only. He passed away peacefully  at 10:26 on June 13, 2024.          The results of significant diagnostics from this hospitalization (including imaging, microbiology, ancillary and laboratory) are listed below for reference.   Significant Diagnostic Studies: DG CHEST PORT 1 VIEW Result Date: Jun 13, 2024 EXAM: 1 VIEW(S) XRAY OF THE CHEST 06-13-24 09:38:00 AM COMPARISON: 05/21/2024 CLINICAL HISTORY: hypoxia FINDINGS: LINES, TUBES AND DEVICES: Right upper PICC in place with tip in the right atrium. Endotracheal tube in place with tip 4.5 cm above the carina. LUNGS AND PLEURA: Low lung volumes. Diffuse interstitial and patchy airspace opacities similar to prior. Worsening right upper lobe opacity. Small right pleural effusion. No pneumothorax. No pulmonary edema. HEART AND MEDIASTINUM: Atherosclerotic plaque noted. Post-CABG changes. BONES AND SOFT TISSUES: Intact sternotomy  wires. No acute osseous abnormality. IMPRESSION: 1. Diffuse interstitial and patchy airspace opacities, similar to prior. Likely due to pulmonary edema. 2. Small right pleural effusion. 3.  Worsening right upper lobe opacity. Electronically signed by: Waddell Calk MD 30-May-2024 10:12 AM EDT RP Workstation: [id number]   MR BRAIN WO CONTRAST Result Date: 05/23/2024 EXAM: MR Brain without Intravenous Contrast. CLINICAL HISTORY: Altered mental status. TECHNIQUE: Magnetic resonance images of the brain without intravenous contrast in multiple planes. Patient motion is present on today's study. CONTRAST: Without. COMPARISON: MRI of the head without contrast 05/16/2024. CT head without contrast 05/20/2024. FINDINGS: BRAIN: No restricted diffusion to indicate acute infarction. No intracranial mass or hemorrhage. No midline shift or extra-axial fluid collection. No cerebellar tonsillar ectopia. The central arterial and venous flow voids are patent. A remote lacunar infarct is present in the anterior right corona radiata. VENTRICLES: No hydrocephalus. ORBITS: The orbits are normal. SINUSES AND MASTOIDS: Nasopharyngeal fluid and bilateral mastoid effusions are likely secondary to intubation. BONES: No acute fracture or focal osseous lesion. IMPRESSION: 1. No acute intracranial abnormality. 2. 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 3. Nasopharyngeal fluid and bilateral mastoid effusions, likely secondary to intubation. Electronically signed by: Lonni Necessary MD 05/23/2024 01:11 PM EDT RP Workstation: [id number]   DG Abd 1 View Result Date: 05/22/2024 CLINICAL DATA:  Constipation. EXAM: ABDOMEN - 1 VIEW COMPARISON:  05/18/2024 FINDINGS: Moderate stool in the ascending colon. Gaseous distension of transverse and descending colon. No definite small bowel dilatation or evidence of obstruction. Vascular stents in the lower abdomen and pelvis. On limited assessment, no acute osseous findings. IMPRESSION: Moderate stool in the ascending colon. Gaseous distension of transverse and descending colon. Electronically Signed   By: Andrea Gasman M.D.   On: 05/22/2024 15:51   US  EKG SITE  RITE Result Date: 05/21/2024 If Site Rite image not attached, placement could not be confirmed due to current cardiac rhythm.  DG CHEST PORT 1 VIEW Result Date: 05/21/2024 CLINICAL DATA:  Check endotracheal tube placement EXAM: PORTABLE CHEST 1 VIEW COMPARISON:  05/17/2024 FINDINGS: The tracheal tube is noted in satisfactory position. Cardiac shadow is stable. Aortic calcifications are noted. Left basilar atelectasis is seen. No sizable effusion is noted. Diffuse vascular congestion is again seen and stable. Multiple old rib fractures are noted on the right. IMPRESSION: Vascular congestion with left basilar atelectasis. Endotracheal tube in satisfactory position. Old right-sided rib fractures. Electronically Signed   By: Oneil Devonshire M.D.   On: 05/21/2024 01:52   CT HEAD WO CONTRAST ( ) Result Date: 05/20/2024 EXAM: CT HEAD WITHOUT CONTRAST 05/20/2024 03:41:38 PM TECHNIQUE: CT of the head was performed without the administration of intravenous contrast. Automated exposure control, iterative reconstruction, and/or weight based adjustment of the mA/kV was utilized to reduce the radiation dose to as low as reasonably achievable. COMPARISON: MR head May 16, 2024 CLINICAL HISTORY: Altered mental status, recent cardiac arrest. FINDINGS: BRAIN AND VENTRICLES: No acute hemorrhage. No evidence of acute infarct. No hydrocephalus. No extra-axial collection. No mass effect or midline shift. Remote right basal ganglia lacunar infarct. ORBITS: No acute abnormality. SINUSES: No acute abnormality. SOFT TISSUES AND SKULL: No acute soft tissue abnormality. No skull fracture. IMPRESSION: 1. No acute intracranial abnormality. Electronically signed by: Gilmore Molt MD 05/20/2024 11:49 PM EDT RP Workstation: [id number]   VAS US  LOWER EXTREMITY VENOUS (DVT) Result Date: 05/19/2024  Lower Venous DVT Study Patient Name:  ANSH FAUBLE  Date of Exam:   05/18/2024 Medical Rec #: [id number]  Accession #:    [id number] Date of  Birth: 05-15-1946     Patient Gender: M Patient Age:   34 years Exam Location:  [facility] Procedure:      VAS US  LOWER EXTREMITY VENOUS (DVT) Referring Phys: LEITA EINSTEIN --------------------------------------------------------------------------------  Indications: S/P PEA arrest.  Limitations: Poor ultrasound/tissue interface and calcific shadowing, patient uncooperative (due to condition). Comparison Study: No previous exams Performing Technologist: Jody Hill RVT, RDMS  Examination Guidelines: A complete evaluation includes B-mode imaging, spectral Doppler, color Doppler, and power Doppler as needed of all accessible portions of each vessel. Bilateral testing is considered an integral part of a complete examination. Limited examinations for reoccurring indications may be performed as noted. The reflux portion of the exam is performed with the patient in reverse Trendelenburg.  +---------+---------------+---------+-----------+----------+--------------+ RIGHT    CompressibilityPhasicitySpontaneityPropertiesThrombus Aging +---------+---------------+---------+-----------+----------+--------------+ CFV      Full           Yes      Yes                                 +---------+---------------+---------+-----------+----------+--------------+ SFJ      Full                                                        +---------+---------------+---------+-----------+----------+--------------+ FV Prox  Full           Yes      Yes                                 +---------+---------------+---------+-----------+----------+--------------+ FV Mid   Full           Yes      Yes                                 +---------+---------------+---------+-----------+----------+--------------+ FV DistalFull           Yes      Yes                                 +---------+---------------+---------+-----------+----------+--------------+ PFV      Full                                                         +---------+---------------+---------+-----------+----------+--------------+ POP      Full           Yes      Yes                                 +---------+---------------+---------+-----------+----------+--------------+ PTV      Full                                                        +---------+---------------+---------+-----------+----------+--------------+  PERO     Full                                                        +---------+---------------+---------+-----------+----------+--------------+   +---------+---------------+---------+-----------+----------+-------------------+ LEFT     CompressibilityPhasicitySpontaneityPropertiesThrombus Aging      +---------+---------------+---------+-----------+----------+-------------------+ CFV      Full           Yes      Yes                                      +---------+---------------+---------+-----------+----------+-------------------+ SFJ      Full                                                             +---------+---------------+---------+-----------+----------+-------------------+ FV Prox  Full           Yes      Yes                                      +---------+---------------+---------+-----------+----------+-------------------+ FV Mid   Full           Yes      Yes                                      +---------+---------------+---------+-----------+----------+-------------------+ FV DistalFull           Yes      Yes                  Not well visualized +---------+---------------+---------+-----------+----------+-------------------+ PFV      Full                                                             +---------+---------------+---------+-----------+----------+-------------------+ POP      Full           Yes      Yes                  Not well visualized +---------+---------------+---------+-----------+----------+-------------------+ PTV      Full                                                              +---------+---------------+---------+-----------+----------+-------------------+ PERO     Full                                                             +---------+---------------+---------+-----------+----------+-------------------+  Summary: BILATERAL: - No evidence of deep vein thrombosis seen in the lower extremities, bilaterally. - RIGHT: - A cystic structure is found in the popliteal fossa.  LEFT: - No cystic structure found in the popliteal fossa.  *See table(s) above for measurements and observations. Electronically signed by Gaile New MD on 05/19/2024 at 8:18:57 AM.    Final    US  Abdomen Limited RUQ (LIVER/GB) Result Date: 05/18/2024 CLINICAL DATA:  Transaminitis. EXAM: ULTRASOUND ABDOMEN LIMITED RIGHT UPPER QUADRANT COMPARISON:  None Available. FINDINGS: Gallbladder: Dependent gallstones identified with intraluminal sludge associated. Gallbladder wall thickness upper normal at 2.7 mm. Small volume pericholecystic fluid noted. No sonographic Murphy sign. Common bile duct: Diameter: 3-4 mm Liver: Mild coarsening of hepatic echotexture raises the question of some degree of fatty deposition. No focal abnormality evident. Portal vein is patent on color Doppler imaging with normal direction of blood flow towards the liver. Other: Small right pleural effusion. IMPRESSION: 1. Cholelithiasis with gallbladder sludge and small volume pericholecystic fluid. No sonographic Murphy sign. Correlation for signs/symptoms of acute cholecystitis recommended. Nuclear scintigraphy may prove helpful to further evaluate as clinically warranted. 2. No biliary dilatation. 3. Mild coarsening of hepatic echotexture raises the question of some degree of fatty deposition. Electronically Signed   By: Camellia Candle M.D.   On: 05/18/2024 13:02   ECHOCARDIOGRAM COMPLETE Result Date: 05/18/2024    ECHOCARDIOGRAM REPORT   Patient Name:   GYASI HAZZARD Date of  Exam: 05/18/2024 Medical Rec #:  [id number]    Height:       67.0 in Accession #:    [id number]   Weight:       138.0 lb Date of Birth:  04-18-1946    BSA:          1.727 m Patient Age:    78 years     BP:           143/61 mmHg Patient Gender: M            HR:           93 bpm. Exam Location:  Inpatient Procedure: 2D Echo, Cardiac Doppler and Color Doppler (Both Spectral and Color            Flow Doppler were utilized during procedure). Indications:    Cardiac arrest I46.9  History:        Patient has prior history of Echocardiogram examinations, most                 recent 03/27/2018. TIA and COPD, Signs/Symptoms:Fatigue; Risk                 Factors:Hypertension. H/O Atrial tachycardia, PAD, CKD,                 Hyperlipidemia.  Sonographer:    BERNARDA ROCKS Referring Phys: [id number] LAURA R GLEASON IMPRESSIONS  1. Left ventricular ejection fraction, by estimation, is 60 to 65%. The left ventricle has normal function. Left ventricular endocardial border not optimally defined to evaluate regional wall motion. Left ventricular diastolic parameters are consistent with Grade I diastolic dysfunction (impaired relaxation).  2. Right ventricular systolic function is normal. The right ventricular size is normal. Tricuspid regurgitation signal is inadequate for assessing PA pressure.  3. The mitral valve is grossly normal. No evidence of mitral valve regurgitation. No evidence of mitral stenosis.  4. The aortic valve was not well visualized. Aortic valve regurgitation is not visualized. No aortic stenosis is present.  5. The inferior vena  cava is dilated in size with >50% respiratory variability, suggesting right atrial pressure of 8 mmHg. Comparison(s): No prior Echocardiogram. FINDINGS  Left Ventricle: Left ventricular ejection fraction, by estimation, is 60 to 65%. The left ventricle has normal function. Left ventricular endocardial border not optimally defined to evaluate regional wall motion. Strain was performed and the  global longitudinal strain is indeterminate. The left ventricular internal cavity size was normal in size. There is no left ventricular hypertrophy. Left ventricular diastolic parameters are consistent with Grade I diastolic dysfunction (impaired relaxation). Normal left ventricular filling pressure. Right Ventricle: The right ventricular size is normal. No increase in right ventricular wall thickness. Right ventricular systolic function is normal. Tricuspid regurgitation signal is inadequate for assessing PA pressure. Left Atrium: Left atrial size was normal in size. Right Atrium: Right atrial size was normal in size. Pericardium: There is no evidence of pericardial effusion. Mitral Valve: The mitral valve is grossly normal. No evidence of mitral valve regurgitation. No evidence of mitral valve stenosis. MV peak gradient, 4.8 mmHg. The mean mitral valve gradient is 3.0 mmHg. Tricuspid Valve: The tricuspid valve is grossly normal. Tricuspid valve regurgitation is not demonstrated. No evidence of tricuspid stenosis. Aortic Valve: The aortic valve was not well visualized. Aortic valve regurgitation is not visualized. No aortic stenosis is present. Aortic valve mean gradient measures 4.0 mmHg. Aortic valve peak gradient measures 7.2 mmHg. Aortic valve area, by VTI measures 1.67 cm. Pulmonic Valve: The pulmonic valve was not well visualized. Pulmonic valve regurgitation is not visualized. No evidence of pulmonic stenosis. Aorta: The aortic root and ascending aorta are structurally normal, with no evidence of dilitation. Venous: The inferior vena cava is dilated in size with greater than 50% respiratory variability, suggesting right atrial pressure of 8 mmHg. IAS/Shunts: No atrial level shunt detected by color flow Doppler. Additional Comments: 3D was performed not requiring image post processing on an independent workstation and was indeterminate.  LEFT VENTRICLE PLAX 2D LVIDd:         4.80 cm      Diastology LVIDs:          3.30 cm      LV e' medial:    6.15 cm/s LV PW:         0.80 cm      LV E/e' medial:  16.2 LV IVS:        0.90 cm      LV e' lateral:   10.30 cm/s LVOT diam:     1.70 cm      LV E/e' lateral: 9.7 LV SV:         40 LV SV Index:   23 LVOT Area:     2.27 cm  LV Volumes (MOD) LV vol d, MOD A2C: 81.6 ml LV vol d, MOD A4C: 144.0 ml LV vol s, MOD A2C: 33.9 ml LV vol s, MOD A4C: 58.0 ml LV SV MOD A2C:     47.7 ml LV SV MOD A4C:     144.0 ml LV SV MOD BP:      68.0 ml RIGHT VENTRICLE            IVC RV Basal diam:  3.20 cm    IVC diam: 2.10 cm RV S prime:     6.16 cm/s TAPSE (M-mode): 1.4 cm LEFT ATRIUM             Index        RIGHT ATRIUM  Index LA diam:        4.30 cm 2.49 cm/m   RA Area:     9.16 cm LA Vol (A2C):   44.6 ml 25.82 ml/m  RA Volume:   15.10 ml 8.74 ml/m LA Vol (A4C):   53.5 ml 30.97 ml/m LA Biplane Vol: 54.2 ml 31.38 ml/m  AORTIC VALVE                    PULMONIC VALVE AV Area (Vmax):    1.76 cm     PV Vmax:       1.03 m/s AV Area (Vmean):   1.50 cm     PV Peak grad:  4.2 mmHg AV Area (VTI):     1.67 cm AV Vmax:           134.00 cm/s AV Vmean:          89.200 cm/s AV VTI:            0.242 m AV Peak Grad:      7.2 mmHg AV Mean Grad:      4.0 mmHg LVOT Vmax:         104.00 cm/s LVOT Vmean:        58.800 cm/s LVOT VTI:          0.178 m LVOT/AV VTI ratio: 0.74  AORTA Ao Root diam: 3.10 cm Ao Asc diam:  2.10 cm MITRAL VALVE MV Area (PHT): 7.51 cm     SHUNTS MV Area VTI:   1.72 cm     Systemic VTI:  0.18 m MV Peak grad:  4.8 mmHg     Systemic Diam: 1.70 cm MV Mean grad:  3.0 mmHg MV Vmax:       1.10 m/s MV Vmean:      73.3 cm/s MV Decel Time: 101 msec MV E velocity: 99.80 cm/s MV A velocity: 114.00 cm/s MV E/A ratio:  0.88 Vishnu Priya Mallipeddi Electronically signed by Diannah Late Mallipeddi Signature Date/Time: 05/18/2024/10:40:24 AM    Final    Overnight EEG with video Result Date: 05/18/2024 Shelton Arlin KIDD, MD     05/18/2024 12:19 PM Patient Name: CY BRESEE MRN: [id number]  Epilepsy Attending: Arlin KIDD Shelton Referring Physician/Provider: Merrianne Locus, MD Duration: 05/17/2024 1507 to 05/18/2024 9093 Patient history: 78yo M with ams. EEG to evaluate for seizure Level of alertness: Awake/ lethargic AEDs during EEG study: Propofol  Technical aspects: This EEG study was done with scalp electrodes positioned according to the 10-20 International system of electrode placement. Electrical activity was reviewed with band pass filter of 1-70Hz , sensitivity of 7 uV/mm, display speed of 22mm/sec with a 60Hz  notched filter applied as appropriate. EEG data were recorded continuously and digitally stored.  Video monitoring was available and reviewed as appropriate. Description: EEG initially showed continuous generalized 3 to 6 Hz theta-delta slowing. At around 2228 on 10/4/20205, EEG showed generalized attenuation and patient subsequently went to cardiac arrest. CPR was performed. EEG was disconnected between 05/17/2024 2250 to 05/18/2024 0031 as patient was being transferred to ICU. EEG then showed continuous generalized 3 to 6 Hz theta-delta slowing. Hyperventilation and photic stimulation were not performed.   ABNORMALITY - Continuous slow, generalized IMPRESSION: This study is suggestive of moderate to severe diffuse encephalopathy, nonspecific etiology. No seizures or epileptiform discharges were seen throughout the recording. Arlin KIDD Shelton   DG Abd Portable 1V Result Date: 05/18/2024 EXAM: 1 VIEW XRAY OF THE ABDOMEN 05/18/2024 01:08:42 AM CLINICAL HISTORY: Encounter for orogastric (  OG) tube placement FINDINGS: LINES, TUBES AND DEVICES: Enteric tube in place with tip and side port overlying the proximal stomach. BOWEL: Nonobstructive bowel gas pattern. SOFT TISSUES: Diffuse vascular calcifications. Right iliac stent noted. BONES: No acute osseous abnormality. IMPRESSION: 1. Enteric tube in place with tip and side port overlying the proximal stomach. Electronically signed by: Dorethia Molt MD  05/18/2024 01:33 AM EDT RP Workstation: [id number]   DG CHEST PORT 1 VIEW Result Date: 05/17/2024 EXAM: 1 VIEW XRAY OF THE CHEST 05/17/2024 11:22:03 PM COMPARISON: None available. CLINICAL HISTORY: History of ETT. ETT placement FINDINGS: LINES, TUBES AND DEVICES: Endotracheal Tube tip at the level of the clavicular head is 4.6 cm above the carina. LUNGS AND PLEURA: Diffuse interstitial pulmonary infiltrate present in keeping with mild-to-moderate interstitial pulmonary edema. No pneumothorax. No pleural effusion. HEART AND MEDIASTINUM: Coronary artery bypass grafting has been performed. Cardiac size within normal limits. BONES AND SOFT TISSUES: Acute displaced fracture of the right eighth rib noted laterally. IMPRESSION: 1. Acute displaced fracture of the right eighth rib laterally. 2. Mild-to-moderate interstitial pulmonary edema. 3. Endotracheal tube tip at the level of the clavicular heads, 4.6 cm above the carina. Electronically signed by: Dorethia Molt MD 05/17/2024 11:29 PM EDT RP Workstation: [id number]   DG Chest Port 1 View Result Date: 05/17/2024 CLINICAL DATA:  [id number] Dyspnea [id number] Hypoxia [id number] EXAM: PORTABLE CHEST - 1 VIEW COMPARISON:  May 13, 2024 FINDINGS: Low lung volumes. Similar diffuse interstitial opacities throughout both lungs. No new airspace consolidation or pneumothorax. Moderate cardiomegaly. Sternotomy wires and CABG markers. Tortuous aorta with aortic atherosclerosis. Multilevel thoracic osteophytosis. IMPRESSION: Lower lung volumes. Otherwise, no significant interval change to the lungs. Electronically Signed   By: Rogelia Myers M.D.   On: 05/17/2024 09:16   MR BRAIN WO CONTRAST Result Date: 05/16/2024 EXAM: MRI BRAIN WITHOUT CONTRAST 05/16/2024 05:58:14 PM TECHNIQUE: Multiplanar multisequence MRI of the head/brain was performed without the administration of intravenous contrast. COMPARISON: 03/29/2018 CLINICAL HISTORY: Mental status change, unknown cause.  78 year old male with history of CABG, hypertension, CKD 3, and diabetes mellitus. Patient presents with decreased appetite, inability to ambulate, confusion, tremors, and general decline over the past 2 months. He is awake, mildly lethargic, and confused, with confusion present all day today, previously mostly at night. Poor oral intake over the past few weeks. No vomiting, diarrhea, chest, or abdominal pain. Generalized weakness and inability to ambulate. Tremors noted today. FINDINGS: BRAIN AND VENTRICLES: Multiple sequences are markedly motion degraded. No acute infarct, intracranial hemorrhage, mass, midline shift, or hydrocephalus. Incidental cavum septum pellucidum et vergae. The sella is unremarkable. Normal flow voids. ORBITS: No acute abnormality. SINUSES AND MASTOIDS: No acute abnormality. BONES AND SOFT TISSUES: Normal marrow signal. No acute soft tissue abnormality. IMPRESSION: 1. No acute intracranial abnormality. 2. Exam degraded by motion, which may limit detection of subtle findings. 3. Incidental cavum septum pellucidum et vergae. Electronically signed by: Franky Stanford MD 05/16/2024 09:40 PM EDT RP Workstation: [id number]   CT SOFT TISSUE NECK WO CONTRAST Addendum Date: 05/16/2024 ADDENDUM #1 ADDENDUM: Following initial interpretation of the study, a mildly displaced fracture of the C6 spinous process was detected by [facility]. The fracture of the spinous process is new since a CT of the cervical spine performed 01/28/24. It is not clear whether it is acute or recent and requires clinical correlation. There is also a healing fracture deformity of the right posterior third rib. ---------------------------------------------------- Electronically signed by: Evalene Coho MD 05/16/2024 10:40 AM EDT RP Workstation: [id number]  Result Date: 05/16/2024 ORIGINAL REPORT EXAM: CT NECK WITHOUT CONTRAST 05/16/2024 08:32:29 AM TECHNIQUE: CT of the neck was performed without the administration of intravenous  contrast. Multiplanar reformatted images are provided for review. Automated exposure control, iterative reconstruction, and/or weight based adjustment of the mA/kV was utilized to reduce the radiation dose to as low as reasonably achievable. COMPARISON: CT of the neck dated 08/17/2017. CLINICAL HISTORY: right neck swelling. right face swelling/erythema; right neck swelling. right face swelling/erythema; FINDINGS: AERODIGESTIVE TRACT: No discrete mass. No edema. SALIVARY GLANDS: The right parotid gland is enlarged relative to the left and also increased in attenuation. The submandibular glands are symmetric in size and similar in appearance. THYROID : Unremarkable. LYMPH NODES: No enlarged or suspicious lymph nodes present. SOFT TISSUES: There is stranding of the subcutaneous fat in the right neck. No masses are evident. BRAIN, ORBITS, SINUSES AND MASTOIDS: No acute abnormality. LUNGS AND MEDIASTINUM: There is a mild right-sided pleural effusion. BONES: There are mild-to-moderate degenerative changes throughout the cervical spine. VASCULATURE: There is extensive calcific plaque within the carotid bulbs bilaterally. IMPRESSION: 1. Enlarged right parotid gland with increased attenuation and adjacent subcutaneous fat stranding, without mass or suspicious lymphadenopathy. Findings are suggestive of cellulitis with possible parotiditis. 2. Mild right-sided pleural effusion. Electronically signed by: Evalene Coho MD 05/16/2024 09:15 AM EDT RP Workstation: [id number]   CT MAXILLOFACIAL WO CONTRAST Result Date: 05/16/2024 EXAM: CT OF THE FACE WITHOUT CONTRAST 05/16/2024 08:32:29 AM TECHNIQUE: CT of the face was performed without the administration of intravenous contrast. Multiplanar reformatted images are provided for review. Automated exposure control, iterative reconstruction, and/or weight based adjustment of the mA/kV was utilized to reduce the radiation dose to as low as reasonably achievable. COMPARISON: CT of  the head dated 05/13/2024. CLINICAL HISTORY: Right face swelling/erythema. FINDINGS: FACIAL BONES: No acute facial fracture. No mandibular dislocation. No suspicious bone lesion. ORBITS: Globes are intact. Mild right periorbital soft tissue swelling. No postseptal soft tissue swelling or stranding. No acute traumatic injury. No inflammatory change. SINUSES AND MASTOIDS: No acute abnormality. SOFT TISSUES: The right parotid gland is swollen and increased in attenuation relative to the left. There is subcutaneous soft tissue stranding throughout the right face. The submandibular glands are symmetric in size and unremarkable in appearance. There is extensive calcific atheromatous disease within the carotid bulbs. IMPRESSION: 1. Right parotiditis with associated right facial cellulitis. 2. Mild right periorbital soft tissue swelling without postseptal involvement (preseptal cellulitis). 3. 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 Electronically signed by: Evalene Coho MD 05/16/2024 09:19 AM EDT RP Workstation: [id number]   DG Neck Soft Tissue Result Date: 05/16/2024 CLINICAL DATA:  Edema due to infection. EXAM: NECK SOFT TISSUES - 1+ VIEW COMPARISON:  None Available. FINDINGS: Limited assessment due to patient positioning and obscuration of the mid and lower neck by the patient's shoulders. Telemetry leads and snaps project over the anterior soft tissues of the lower face and neck. No evidence for prevertebral soft tissue swelling anterior to the C1 or C2 vertebral bodies. Study is nondiagnostic for assessment of prevertebral soft tissues inferior to this level. IMPRESSION: Markedly limited assessment due to patient positioning and obscuration of the mid and lower neck by the patient's shoulders. No evidence for prevertebral soft tissue swelling anterior to the C1 or C2 vertebral bodies although exam is otherwise nondiagnostic. Given the history of soft tissue swelling, CT neck with contrast  could be used to further evaluate. Electronically Signed   By: Camellia Candle M.D.   On: 05/16/2024  05:59   CT Head Wo Contrast Result Date: 05/13/2024 CLINICAL DATA:  Worsening health over the past month with decreased appetite, nighttime confusion, unsteady gait and tremors. EXAM: CT HEAD WITHOUT CONTRAST TECHNIQUE: Contiguous axial images were obtained from the base of the skull through the vertex without intravenous contrast. RADIATION DOSE REDUCTION: This exam was performed according to the departmental dose-optimization program which includes automated exposure control, adjustment of the mA and/or kV according to patient size and/or use of iterative reconstruction technique. COMPARISON:  03/29/2024 FINDINGS: Brain: Ventricles, cisterns and other CSF spaces are normal. Cavum septum variant is present. Very minimal chronic ischemic microvascular disease. 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 No mass, mass effect, shift of midline structures or acute hemorrhage. No evidence of acute infarction. Vascular: No hyperdense vessel or unexpected calcification. Skull: Normal. Negative for fracture or focal lesion. Sinuses/Orbits: No acute finding. Other: None. IMPRESSION: 1. No acute findings. 2. Very minimal chronic ischemic microvascular disease. 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 Electronically Signed   By: Toribio Agreste M.D.   On: 05/13/2024 15:48   DG Chest Port 1 View Result Date: 05/13/2024 CLINICAL DATA:  Weakness EXAM: PORTABLE CHEST 1 VIEW COMPARISON:  Chest x-ray performed April 14, 2024 FINDINGS: Postsurgical changes from sternotomy. Low lung volumes. Similar appearance of interstitial airspace opacities. Elevated left hemidiaphragm. IMPRESSION: 1. Low lung volumes. 2. Grossly similar accounting for variation in technique. Electronically Signed   By: Maude Naegeli M.D.   On: 05/13/2024 13:21   VAS US  ABI WITH/WO TBI Result Date: 05/13/2024   LOWER EXTREMITY DOPPLER STUDY Patient Name:  BREYON SIGG  Date of Exam:   05/13/2024 Medical Rec #: [id number]     Accession #:    [id number] Date of Birth: Mar 06, 1946     Patient Gender: M Patient Age:   30 years Exam Location:  [address] Procedure:      VAS US  ABI WITH/WO TBI Referring Phys: LONNI GASKINS --------------------------------------------------------------------------------  Indications: Peripheral artery disease.  Vascular Interventions: 04/17/24: DCBA right CIA and EIA stents.                         09/22/21: Right CIA and EIA angioplasty with CIA stent.                         08/09/16: Right iliofemoral endarterectomy.                         Bovine pericardial patch angioplasty, right common                         femoral artery.                         Redo right limb of right to left femoral-femoral bypass                         graft. Performing Technologist: King Pierre RVT  Examination Guidelines: A complete evaluation includes at minimum, Doppler waveform signals and systolic blood pressure reading at the level of bilateral brachial, anterior tibial, and posterior tibial arteries, when vessel segments are accessible. Bilateral testing is considered an integral part of a complete examination. Photoelectric Plethysmograph (PPG) waveforms and toe systolic pressure readings are included as required and  additional duplex testing as needed. Limited examinations for reoccurring indications may be performed as noted.  ABI Findings: +---------+------------------+-----+----------+--------+ Right    Rt Pressure (mmHg)IndexWaveform  Comment  +---------+------------------+-----+----------+--------+ Brachial 157                                       +---------+------------------+-----+----------+--------+ PTA      254               1.62 monophasic         +---------+------------------+-----+----------+--------+ DP       85                0.54 monophasic          +---------+------------------+-----+----------+--------+ Great Toe0                 0.00                    +---------+------------------+-----+----------+--------+ +---------+------------------+-----+----------+-------+ Left     Lt Pressure (mmHg)IndexWaveform  Comment +---------+------------------+-----+----------+-------+ Brachial 153                                      +---------+------------------+-----+----------+-------+ PTA      254               1.62 monophasic        +---------+------------------+-----+----------+-------+ DP       254               1.62 monophasic        +---------+------------------+-----+----------+-------+ Great Toe0                 0.00                   +---------+------------------+-----+----------+-------+ +-------+-----------+-----------+------------+------------+ ABI/TBIToday's ABIToday's TBIPrevious ABIPrevious TBI +-------+-----------+-----------+------------+------------+ Right  [place]                    0.7         0.28         +-------+-----------+-----------+------------+------------+ Left   [place]                    [place]          0.18         +-------+-----------+-----------+------------+------------+  Previous ABI on 01/22/24 0.7/0.28 [place]/0.18.  Summary: Right: Resting right ankle-brachial index indicates noncompressible right lower extremity arteries. The right toe-brachial index is abnormal.  Left: Resting left ankle-brachial index indicates noncompressible left lower extremity arteries. The left toe-brachial index is abnormal.  *See table(s) above for measurements and observations.  Electronically signed by Lonni Gaskins MD on 05/13/2024 at 12:34:42 PM.    Final    VAS US  AORTA/IVC/ILIACS Result Date: 05/13/2024 ABDOMINAL AORTA STUDY Patient Name:  KAITLYN SKOWRON  Date of Exam:   05/13/2024 Medical Rec #: [id number]     Accession #:    [id number] Date of Birth: Jun 15, 1946     Patient Gender: M Patient Age:   42 years Exam  Location:  [address] Procedure:      VAS US  AORTA/IVC/ILIACS Referring Phys: LONNI GASKINS --------------------------------------------------------------------------------  Vascular Interventions: 04/17/24: DCBA right CIA and EIA stents.                         09/22/21:  Right CIA and EIA angioplasty with CIA stent.                         08/09/16: Right iliofemoral endarterectomy.                         Bovine pericardial patch angioplasty, right common                         femoral artery.                         Redo right limb of right to left femoral-femoral bypass                         graft.  Comparison Study: 01/22/24: Patent right CIA and EIA stents with elevated                   velocities in the EIA in the > 50% range. Patent right to left                   fem- fem BPG with 50-74% stenosis at both anastomoses. Performing Technologist: King Pierre RVT  Examination Guidelines: A complete evaluation includes B-mode imaging, spectral Doppler, color Doppler, and power Doppler as needed of all accessible portions of each vessel. Bilateral testing is considered an integral part of a complete examination. Limited examinations for reoccurring indications may be performed as noted.  Abdominal Aorta Findings: +-------------+-------+----------+----------+----------+--------+---------+ Location     AP (cm)Trans (cm)PSV (cm/s)Waveform  ThrombusComments  +-------------+-------+----------+----------+----------+--------+---------+ RT CIA Prox                   217       biphasic                    +-------------+-------+----------+----------+----------+--------+---------+ RT CIA Mid                    299       monophasic                  +-------------+-------+----------+----------+----------+--------+---------+ RT CIA Distal                 154       monophasic        turbulent +-------------+-------+----------+----------+----------+--------+---------+ RT EIA Prox                    467       monophasic                  +-------------+-------+----------+----------+----------+--------+---------+ RT EIA Mid                    388       monophasic                  +-------------+-------+----------+----------+----------+--------+---------+ RT EIA Distal                 383       monophasic                  +-------------+-------+----------+----------+----------+--------+---------+ Right Graft #1: +---------------------+--------+--------+----------+--------+ Right to left fem-femPSV cm/sStenosisWaveform  Comments +---------------------+--------+--------+----------+--------+ Inflow               383  monophasic         +---------------------+--------+--------+----------+--------+ Prox Anastomosis     272             monophasic         +---------------------+--------+--------+----------+--------+ Proximal Graft       104             monophasic         +---------------------+--------+--------+----------+--------+ Mid Graft            60              monophasic         +---------------------+--------+--------+----------+--------+ Distal Graft         62              biphasic           +---------------------+--------+--------+----------+--------+ Distal Anastomosis   222             biphasic           +---------------------+--------+--------+----------+--------+ Outflow              367             monophasic         +---------------------+--------+--------+----------+--------+  Summary: Stenosis: Elevated velocities in the CIA with significant elevation in the EIA. Patent right to left fem-fem with inflow stenosis (EIA)and outflow stenosis (SFA).  *See table(s) above for measurements and observations.  Electronically signed by Lonni Gaskins MD on 05/13/2024 at 12:01:43 PM.    Final     Microbiology: Recent Results (from the past 240 hours)  MRSA Next Gen by PCR, Nasal     Status: None   Collection Time:  05/19/24  5:00 AM   Specimen: Nasal Mucosa; Nasal Swab  Result Value Ref Range Status   MRSA by PCR Next Gen NOT DETECTED NOT DETECTED Final    Comment: (NOTE) The GeneXpert MRSA Assay (FDA approved for NASAL specimens only), is one component of a comprehensive MRSA colonization surveillance program. It is not intended to diagnose MRSA infection nor to guide or monitor treatment for MRSA infections. Test performance is not FDA approved in patients less than 50 years old. Performed at [facility], [address], [place], [place] [address]     Time spent: 24 minutes  Signed: Zola LOISE Herter, MD 2024/06/07

## 2024-06-14 NOTE — Accreditation Note (Signed)
 Death within 24 hours of discontinuation of bilateral soft wrist restraints logged on 10/15-2025 at 0944 by Valentin Lai RN

## 2024-06-14 NOTE — Progress Notes (Signed)
 RNs went into patients room, found patient to be breathless and pulseless. [facility] contacted.  Talked with Holli Pouch.    23-Jun-2024 07-08-2007  Post Mortem Checklist  Date of Death 2024/06/23  Time of Death July 08, 2007  Pronounced By Graeme Shape, RN & Luretha Hoover, RN  Next of kin notified Yes (Family at bedside)  Name of next of kin notified of death Jahzier Villalon  Contact Person's Relationship to Patient Spouse  Contact Person's Phone Number [phone]  Family Communication Notes Wife was at the bedside.  Was the patient a No Code Blue or a Limited Code Blue? No  Did the patient die unattended? No  Patient restrained (physical/manual hold/chemical)? *See row information* Not applicable

## 2024-06-14 NOTE — Progress Notes (Signed)
 NAME:  [name], MRN:  [id number], DOB:  Feb 22, 1946, LOS: 11 ADMISSION DATE:  05/13/2024 CHIEF COMPLAINT:  Arrest.    History of Present Illness:  78 y/o male with PMH for CAD status post CABG, DM type 2, HTN, hyperlipidemia, CKD stage IV, COPD, PAD, atrial tachycardia, diverticulitis status post colostomy and subsequent takedown, TIA who presented initially from [facility] with generalized weakness, confusion, difficulty ambulating and decreased oral intake.  He has had several episodes of pneumonia and has been deteriorating since May 2025.  He was treated for CAP in August at which time he also was noted to have acute on chronic renal failure.  CT head negative on admission.  Her also had episodic delirium.   Review of the medical record shows the patient has had enhancing lesions in his lumbar and thoracic spine on MRI (12/07/23).  PET scan (12/27/23) also showed Multifocal mild areas of abnormal uptake along the skeleton including several ribs, vertebral bodies and scattered along the pelvis. Some of these lesions are associated with areas of bony sclerosis.  He had separate T9 and L3 bone biopsies without any metastatic carcinoma identified.  He has seen medical oncology and has undergone extensive workup including serum immunoglobulins and SPEP which have been negative.  In addition, tumor markers CEA, CA 19-9, alpha-fetoprotein, and PSA were all unremarkable. During this hospitalization, wife stated she has noticed some shaking in his right arm and right chest intermittently.    This evening he was on and he c/o back pain, took his O2 off and then went into pulseless V tach and then PEA.  Code blue called and CPR started.  Initially lost his pulse and then regained only to lose pulse again then regained.  He was subsequently transferred to 2M11.  Pertinent  Medical History   CAD status post CABG, DM type 2, HTN, hyperlipidemia, CKD stage IV, COPD, PAD, atrial tachycardia, diverticulitis status  post colostomy and subsequent takedown, TIA   Interim History / Subjective:  Currently sedated and intubated.  Agitated and not following commands   Significant Hospital Events: 10/4 transfer to ICU 10/4 Code blue  Objective    Blood pressure 94/77, pulse (!) 116, temperature (!) 97.3 F (36.3 C), resp. rate (!) 22, height 5' 7 (1.702 m), weight 61.7 kg, SpO2 90%.    Vent Mode: PRVC FiO2 (%):  [40 %-60 %] 60 % Set Rate:  [16 bmp] 16 bmp Vt Set:  [370 mL] 370 mL PEEP:  [5 cmH20] 5 cmH20 Plateau Pressure:  [15 cmH20-17 cmH20] 17 cmH20   Intake/Output Summary (Last 24 hours) at 08-Jun-2024 0803 Last data filed at 06-08-24 0700 Gross per 24 hour  Intake 1597.13 ml  Output 1170 ml  Net 427.13 ml   Filed Weights   05/22/24 0500 05/23/24 0620 2024-06-08 0324  Weight: 62.2 kg 63.5 kg 61.7 kg    Examination: General: Agitated not following commands  Lungs: bilateral crackles, some airleak through ETT  Heart:  Tachycardic, nl s1,s2  Abdomen: soft, Nt , distended  Neuro:  slightly more alert when stimulated tracked intermitantly but then continued agitation   Assessment and Plan  Presented to the hospital on 9/30 for generalized weakness confusion, decreased oral intake and difficulty ambulating.  Patient was admitted to hospitalist service.  Hospital admission in August 2025 for CAP.  He was being treated on the hospital floor for acute on chronic renal failure, metabolic encephalopathy and parotitis with antibiotics. Overnight on 10/4 had V. tach subsequently  led to cardiac arrest, received 1 of epi, no shock.  During first pulse check was PEA arrest.   Goals of care discussion with family today  Discussed with family today, regarding the overall status of Mr Kuch, he has had multiple admissions to the hospital this year, he suffered a cardiac arrest during this hospital stay, he has had issues with GI bleeding, and now he has altered mentation with inability to extubate due his  AMS. He can't tolerate tube feeds due to his recent GI bleed and clipping and will need TPN through a central line. Family would like to continue trying to safely extubate him at this time and they are ok with TPN through a central line. They expressed that they care about his overall quality of life and would not want him to have a trach if he needs it should he not be able to get off the mechanical ventilation. For now, we will continue with daily awakening and spontaneous breathing trials.  Multiple discussions with the family regarding overall guarded prognosis with poor mental status that is not improving or very slowly improving  Neuro  #Toxic metabolic encphalopathy  #Possible seizure Repeat Head CT negative Brain MRI on 10/3 motion degraded but no large abnormality noted  -No improvement in mental status, repeat MRI yesterday with no acute abnormality, evidence of remote lacunar infarct in the right corona radiata  -EEG was negative    Pulm #Acute hypoxic respiratory failure, currently barrier to extubation is AMS  - Switched prop to dex  - Keep on fent Daily awakening trials and SBTs  More hypoxic while agitated on 60% oxygen now. Will consider more workup if family would like to continue aggressive care based on conversation today    Cardiac  #V. tach leading to cardiac arrest- ?  V. tach arrest versus PEA arrest: - Never received shock. - EKG postcardiac arrest with ST wave depression in lateral leads. - Troponin minimally elevated.  Will recheck.  BNP 1200. - Lactic acid 2.8> 1.1. - Echo: postcardiac arrest: EF normal with grade 1 diastolic dysfunction. - Will get lower extremity venous Dopplers. - Cardiology consulted.  #Cardiogenic shock: - Postcardiac arrest. - off levophed  - Also contributed by sedation.  Monitor.   #Pulmonary edema: Acute hypoxic respiratory failure: - Reportedly was hypoxic prior to cardiac arrest. - Vent bundle. - tolerated SBT today but  barrier to extubation is mentation  - Hold further diuresis as below.  Shock liver (improving) : - Will trend LFTs.  GI CGE: Suspected UGIB: - IV PPI twice daily. - Resume DVT prophylaxis.  Continue aspirin  and hold Plavix  and monitor hemoglobin closely with recent femorofemoral bypass. - Trend hemoglobin. Unable to obtain enteral access.  GI - No further evidence of bleeding, start heparin  for DVT ppx and aspirin   Renal AKI on CKD: - Baseline 1.9-2.2. -Monitor.  Minimally elevated postcardiac arrest. -Was being given IV fluids before. -Hold diuresis for now. - Replete potassium    Other  Failure to thrive: - Nutrition consult. Started on TPN through a PICC line   Parotitis: - Was started on Unasyn and Zyvox by hospitalist service. Completed both   Peripheral arterial disease: - Status post femorofemoral bypass - 04/17/2024--angioplasty right common iliac and external iliac arteries--Dr. Gretta    C6 spinous process fracture -per neurosurgery--continue operative treatment; can use cervical collar if significant pain  Hypertension: Hyperlipidemia: -Will resume meds as appropriate.  DM 2: - SSI.  Constipation Start docusate supp  Moderate  Malnutrition Patient with no oral intake for 8 days. Can't obtain enteral access with GI bleeds  Discussed with family and PICC line was placed and he was started on TPN     Labs   CBC: Recent Labs  Lab 05/18/24 1253 05/18/24 1902 05/19/24 0930 05/20/24 0541 05/22/24 1008 05/23/24 1114  WBC 15.3* 12.8* 10.1 10.6* 11.9*  --   NEUTROABS  --   --   --   --  10.3*  --   HGB 6.2* 7.0* 7.8* 7.4* 7.6* 7.8*  HCT 20.4* 22.6* 25.1* 23.8* 25.1* 23.0*  MCV 82.6 83.4 83.9 84.7 85.7  --   PLT 333 275 263 239 197  --     Basic Metabolic Panel: Recent Labs  Lab 05/17/24 2320 05/18/24 0018 05/20/24 0541 05/21/24 0356 05/22/24 0640 05/23/24 0420 05/23/24 1114 06/01/24 0348  NA 140   < > 141 140 140 140 143 142  K 4.1    < > 3.3* 3.8 3.8 3.4* 4.0 3.6  CL 109   < > 109 110 111 109  --  114*  CO2 13*   < > 20* 16* 17* 20*  --  18*  GLUCOSE 143*   < > 81 75 193* 202*  --  211*  BUN 37*   < > 36* 35* 34* 38*  --  38*  CREATININE 2.65*   < > 2.78* 2.77* 2.29* 2.11*  --  1.80*  CALCIUM  8.8*   < > 8.2* 7.9* 8.1* 8.4*  --  7.8*  MG 2.0  --   --  1.9 2.0 2.2  --  2.1  PHOS  --   --   --  4.7* 4.1 4.2  --  2.6   < > = values in this interval not displayed.   GFR: Estimated Creatinine Clearance: 30.5 mL/min (A) (by C-G formula based on SCr of 1.8 mg/dL (H)). Recent Labs  Lab 05/17/24 2320 05/18/24 0243 05/18/24 1253 05/18/24 1902 05/19/24 0930 05/20/24 0541 05/22/24 1008  WBC 20.0*  --    < > 12.8* 10.1 10.6* 11.9*  LATICACIDVEN 2.8* 1.1  --   --   --   --   --    < > = values in this interval not displayed.    Liver Function Tests: Recent Labs  Lab 05/17/24 2320 05/19/24 0930 05/20/24 0925 05/21/24 0356 05/22/24 0640 05/23/24 0420 06/01/24 0348  AST 475* 132* 68* 33 23  --   --   ALT 264* 159* 121* 75* 56*  --   --   ALKPHOS 102 85 95 74 74  --   --   BILITOT 1.0 0.7 0.8 1.0 0.7  --   --   PROT 6.4* 5.6* 6.4* 5.2* 5.1*  --   --   ALBUMIN  2.5* 2.2* 2.4* 1.9* 1.9* 2.1* 2.0*   No results for input(s): LIPASE, AMYLASE in the last 168 hours. No results for input(s): AMMONIA in the last 168 hours.   ABG    Component Value Date/Time   PHART 7.265 (L) 05/23/2024 1114   PCO2ART 46.1 05/23/2024 1114   PO2ART 93 05/23/2024 1114   HCO3 20.8 05/23/2024 1114   TCO2 22 05/23/2024 1114   ACIDBASEDEF 6.0 (H) 05/23/2024 1114   O2SAT 96 05/23/2024 1114      Past Medical History:  He,  has a past medical history of Anemia, CAD S/P percutaneous coronary angioplasty (1998), CKD (chronic kidney disease) stage 3, GFR 30-59 ml/min (HCC), Diabetes mellitus without complication (  HCC), Diverticulitis (08/16/2017), GERD (gastroesophageal reflux disease), CABG (10/2015), Hypercholesteremia, Hypertension,  Neuropathy, Peripheral vascular disease, Pneumonia, and Tachycardia.   Surgical History:   Past Surgical History:  Procedure Laterality Date   ABDOMINAL AORTOGRAM Bilateral 09/19/2016   Procedure: iliac;  Surgeon: Gaile LELON New, MD;  Location: [facility] INVASIVE CV LAB;  Service: Cardiovascular;  Laterality: Bilateral;   ABDOMINAL AORTOGRAM N/A 04/17/2024   Procedure: ABDOMINAL AORTOGRAM;  Surgeon: Gretta Lonni PARAS, MD;  [facility] INVASIVE CV LAB;  Service: Cardiovascular;  Laterality: N/A;   ABDOMINAL AORTOGRAM W/LOWER EXTREMITY N/A 09/22/2021   Procedure: ABDOMINAL AORTOGRAM W/LOWER EXTREMITY;  Surgeon: Gretta Lonni PARAS, MD;  Location: [facility] CV LAB;  Service: Cardiovascular;  Laterality: N/A;   ABDOMINAL AORTOGRAM W/LOWER EXTREMITY N/A 10/05/2022   Procedure: ABDOMINAL AORTOGRAM W/LOWER EXTREMITY;  Surgeon: Gretta Lonni PARAS, MD;  Location: [facility] CV LAB;  Service: Cardiovascular;  Laterality: N/A;   BIOPSY  05/16/2023   Procedure: BIOPSY;  Surgeon: Shaaron Lamar HERO, MD;  Location: [facility];  Service: Endoscopy;;   CARDIAC CATHETERIZATION  2003   with stent   CARDIAC CATHETERIZATION N/A 10/11/2015   Procedure: Left Heart Cath and Coronary Angiography;  Surgeon: Peter M Swaziland, MD;  Location[facility] INVASIVE CV LAB;  Service: Cardiovascular;  Laterality: N/A;   COLON RESECTION N/A 12/13/2017   Procedure: EXPLORATORY LAPAROTOMY, SIGMOID COLECTOMY WITH COLOSTOMY;  Surgeon: Belinda Cough, MD;  Location: [facility] OR;  Service: General;  Laterality: N/A;   COLONOSCOPY N/A 09/22/2013   Procedure: COLONOSCOPY;  Surgeon: Lamar HERO Shaaron, MD;  Location: [facility];  Service: Endoscopy;  Laterality: N/A;  9:30 AM   COLONOSCOPY N/A 02/19/2018   Procedure: COLONOSCOPY;  Surgeon: Shaaron Lamar HERO, MD;  Location: [facility];  Service: Endoscopy;  Laterality: N/A;  2:00pm   COLONOSCOPY WITH PROPOFOL  N/A 05/16/2023   Procedure: COLONOSCOPY WITH PROPOFOL ;  Surgeon: Shaaron Lamar HERO, MD;   Location: [facility];  Service: Endoscopy;  Laterality: N/A;  730am, asa 3   COLOSTOMY N/A 12/13/2017   Procedure: COLOSTOMY;  Surgeon: Belinda Cough, MD;  Location: [facility] OR;  Service: General;  Laterality: N/A;   COLOSTOMY REVERSAL N/A 03/26/2018   Procedure: COLOSTOMY REVERSAL;  Surgeon: Belinda Cough, MD;  Location: [facility] OR;  Service: General;  Laterality: N/A;   CORONARY ARTERY BYPASS GRAFT N/A 10/18/2015   Procedure: CORONARY ARTERY BYPASS GRAFTING (CABG) x  five, using left internal mammary artery and right leg greater saphenous vein harvested endoscopically;  Surgeon: Elspeth JAYSON Millers, MD;  Location: [facility] OR;  Service: Open Heart Surgery;  Laterality: N/A;   ENDARTERECTOMY FEMORAL Right 08/09/2016   Procedure: ENDARTERECTOMY FEMORAL WITH VEIN PATCH ANGIOPLASTY;  Surgeon: Gaile LELON New, MD;  Location: [facility] OR;  Service: Vascular;  Laterality: Right;   ESOPHAGOGASTRODUODENOSCOPY N/A 10/24/2017   Dr. Shaaron: hiatal hernia   ESOPHAGOGASTRODUODENOSCOPY N/A 01/01/2024   Procedure: EGD (ESOPHAGOGASTRODUODENOSCOPY);  Surgeon: Cinderella Deatrice FALCON, MD;  Location: [facility];  Service: Endoscopy;  Laterality: N/A;   ESOPHAGOGASTRODUODENOSCOPY N/A 05/19/2024   Procedure: EGD (ESOPHAGOGASTRODUODENOSCOPY);  Surgeon: Leigh Elspeth SQUIBB, MD;  Location: [facility] ENDOSCOPY;  Service: Gastroenterology;  Laterality: N/A;   ESOPHAGOGASTRODUODENOSCOPY (EGD) WITH PROPOFOL  N/A 05/16/2023   Procedure: ESOPHAGOGASTRODUODENOSCOPY (EGD) WITH PROPOFOL ;  Surgeon: Shaaron Lamar HERO, MD;  Location: [facility];  Service: Endoscopy;  Laterality: N/A;   FEMORAL-FEMORAL BYPASS GRAFT Bilateral 08/09/2016   Procedure: REVISION BYPASS GRAFT RIGHT FEMORAL-LEFT FEMORAL ARTERY;  Surgeon: Gaile LELON New, MD;  Location: [facility] OR;  Service: Vascular;  Laterality: Bilateral;   FEMORAL-POPLITEAL BYPASS GRAFT     INTRAMEDULLARY (IM) NAIL INTERTROCHANTERIC Left 04/03/2021   Procedure: LEFT  HIP INTRAMEDULLARY (IM) NAIL INTERTROCHANTRIC;   Surgeon: Vernetta Lonni GRADE, MD;  Location: [facility] OR;  Service: Orthopedics;  Laterality: Left;   IR FLUORO GUIDED NEEDLE PLC ASPIRATION/INJECTION LOC  12/26/2023   IR IVC FILTER RETRIEVAL / S&I /IMG GUID/MOD SED  01/15/2024   LOWER EXTREMITY ANGIOGRAPHY N/A 04/17/2024   Procedure: Lower Extremity Angiography;  Surgeon: Gretta Lonni PARAS, MD;  Location: [facility] INVASIVE CV LAB;  Service: Cardiovascular;  Laterality: N/A;   LOWER EXTREMITY INTERVENTION N/A 04/17/2024   Procedure: LOWER EXTREMITY INTERVENTION;  Surgeon: Gretta Lonni PARAS, MD;  Location: [facility];  Service: Cardiovascular;  Laterality: N/A;   MALONEY DILATION N/A 05/16/2023   Procedure: AGAPITO DILATION;  Surgeon: Shaaron Lamar HERO, MD;  Location: [facility];  Service: Endoscopy;  Laterality: N/A;   PERIPHERAL VASCULAR BALLOON ANGIOPLASTY Right 09/22/2021   Procedure: PERIPHERAL VASCULAR BALLOON ANGIOPLASTY;  Surgeon: Gretta Lonni PARAS, MD;  [facility]: [facility];  Service: Cardiovascular;  Laterality: Right;   PERIPHERAL VASCULAR CATHETERIZATION N/A 05/08/2016   Procedure: Lower Extremity Angiography;  Surgeon: Dorn PARAS Lesches, MD;  Location: [facility] INVASIVE CV LAB;  Service: Cardiovascular;  Laterality: N/A;   PERIPHERAL VASCULAR INTERVENTION Right 09/19/2016   Procedure: Peripheral Vascular Intervention;  Surgeon: Gaile LELON New, MD;  Location: [facility] INV[facility] CV LAB;  Service: Cardiovascular;  Laterality: Right;  ext iliac stent   PERIPHERAL VASCULAR INTERVENTION Right 09/22/2021   Procedure: PERIPHERAL VASCULAR INTERVENTION;  Surgeon: Gretta Lonni PARAS, MD;  Location: [facility] CV LAB;  Service: Cardiovascular;  Laterality: Right;   POLYPECTOMY  02/19/2018   Procedure: POLYPECTOMY;  Surgeon: Shaaron Lamar HERO, MD;  Location: [facility];  Service: Endoscopy;;   PROCTOSCOPY  10/24/2017   Procedure: PROCTOSCOPY;  Surgeon: Shaaron Lamar HERO, MD;  Location: [facility];  Service: Endoscopy;;   TEE WITHOUT  CARDIOVERSION N/A 10/18/2015   Procedure: TRANSESOPHAGEAL ECHOCARDIOGRAM (TEE);  Surgeon: Elspeth JAYSON Millers, MD;  Location: [facility] OR;  Service: Open Heart Surgery;  Laterality: N/A;     Social History:   reports that he quit smoking about 23 years ago. His smoking use included cigarettes. He started smoking about 53 years ago. He has a 60 pack-year smoking history. He has never used smokeless tobacco. He reports that he does not drink alcohol  and does not use drugs.   Family History:  His family history includes Colon cancer in his brother; Heart attack in his mother; Stroke in his mother.   Allergies Allergies  Allergen Reactions   Baclofen Other (See Comments)    Altered mental status, extreme drowsiness   Acarbose Diarrhea     Home Medications  Prior to Admission medications   Medication Sig Start Date End Date Taking? Authorizing Provider  aspirin  EC 81 MG tablet Take 1 tablet (81 mg total) by mouth daily with breakfast. 05/23/19  Yes Emokpae, Courage, MD  atorvastatin  (LIPITOR ) 40 MG tablet Take 40 mg by mouth at bedtime. 02/07/24  Yes [provider]  cilostazol  (PLETAL ) 100 MG tablet Take 100 mg by mouth 2 (two) times daily. 02/07/24  Yes [provider]  clopidogrel  (PLAVIX ) 75 MG tablet Take 1 tablet (75 mg total) by mouth in the morning. 04/17/24  Yes Gretta Lonni PARAS, MD  insulin  glargine (LANTUS ) 100 UNIT/ML injection Inject 0.1 mLs (10 Units total) into the skin at bedtime. Patient taking differently: Inject  24 Units into the skin at bedtime. 12/11/23  Yes Johnson, Clanford L, MD  lidocaine  (LIDODERM ) 5 % Place 1 patch onto the skin daily. 05/05/24  Yes [provider]  magnesium  oxide (MAG-OX) 400 MG tablet Take 400 mg by mouth 2 (two) times daily. 12/04/23  Yes [provider]  metoprolol  succinate (TOPROL -XL) 50 MG 24 hr tablet Take 0.5 tablets (25 mg total) by mouth daily. 03/23/24  Yes Emokpae, Courage, MD  vitamin B-12 (CYANOCOBALAMIN )  1000 MCG tablet Take 1,000 mcg by mouth in the morning.   Yes [provider]  amoxicillin -clavulanate (AUGMENTIN ) 875-125 MG tablet Take 1 tablet by mouth 2 (two) times daily. Patient not taking: Reported on 05/13/2024 05/05/24   [provider]  doxycycline  (VIBRA -TABS) 100 MG tablet Take 100 mg by mouth 2 (two) times daily. Patient not taking: Reported on 05/13/2024 05/05/24   [provider]  pantoprazole  (PROTONIX ) 40 MG tablet Take 1 tablet (40 mg total) by mouth daily. 03/23/24   Pearlean Manus, MD  pregabalin  (LYRICA ) 75 MG capsule Take 75 mg by mouth 2 (two) times daily. Patient not taking: Reported on 05/13/2024 01/22/24   [provider]     The patient is critically ill due to acute hypoxic respiratory failure requiring mechanical ventilation.  Critical care was necessary to treat or prevent imminent or life-threatening deterioration. Critical care time was spent by me on the following activities: development of a treatment plan with the patient and/or surrogate as well as nursing, discussions with consultants, evaluation of the patient's response to treatment, examination of the patient, obtaining a history from the patient or surrogate, ordering and performing treatments and interventions, ordering and review of laboratory studies, ordering and review of radiographic studies, review of telemetry data including pulse oximetry, re-evaluation of patient's condition and participation in multidisciplinary rounds.   I personally spent 31 minutes providing critical care not including any separately billable procedures.   Zola LOISE Herter, MD [facility] 2024-06-03 8:03 AM    2024/06/03, 8:03 AM

## 2024-06-14 NOTE — Consult Note (Signed)
 Palliative Care Consult Note                                  Date: 06/16/24   Patient Name: [name]  DOB: July 25, 1946  MRN: [id number]  Age / Sex: 78 y.o., male  PCP: [facility] Referring Physician: Zaida Zola SAILOR, MD  Reason for Consultation: Establishing goals of care  HPI/Patient Profile: 78 y.o. male  with past medical history of coronary artery disease status post CABG, diabetes mellitus type 2, hypertension, hyperlipidemia, CKD stage IV, COPD, peripheral arterial disease, atrial tachycardia, diverticulitis status post colostomy and subsequent takedown,and TIA admitted on 05/13/2024 with generalized weakness, confusion, decreased oral intake, and difficulty ambulating.    10/4 - transferred to [facility] 56[facility] and then later in the day had PEA cardiac arrest and transferred to 55M, intubated   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   x 5   Hypercholesteremia    Hypertension    Neuropathy    Peripheral vascular disease    s/p R-L FEM-FEM BYPASS   Pneumonia    Tachycardia    after CABG, pt on medicine for this    Subjective:   I have reviewed medical records including EPIC notes, labs and imaging, received updates from nursing and attending team, and assessed the patient. Patient is sedated and intubated and unable to participate in goals of care discussions.   Today's Discussion: Patient is sedated and intubated and unable to participate in goals of care discussions. Nursing at bedside patient's oxygen has been desaturating when there are any stimuli. He looks uncomfortable.    Met with patient's daughter [name] in family room. I introduced Palliative Medicine as specialized medical care for people living with serious illness. It focuses on providing relief from symptoms and stress of a serious illness. The goal is to improve quality of life for both the patient and the family.  [name] shared the entire family plans to come in at noon to discuss next steps. [name] shared they are considering shifting to comfort focused care. Her stepmother [name] is proxy decision maker but they have all been working together to make decisions. The patient has not ACP document and has not discussed end of life wishes with family.  [name] shared their faith is very important to them and it will be guiding their decision. [name] questions how they can know they are making the best decision and I suggested making the decision they believe the patient would chose if he were able.   Emotional support and therapeutic listening provided. Discussed the importance of continued conversation with family and the medical providers regarding overall plan of care and treatment options, ensuring decisions are within the context of the patient's values and GOCs. Plan is to have a family meeting at 1200.  1030: Received update from attending provider that patient was having worsening hypoxia and CXR showed new infiltrate. Attending provider met with family to discuss patient's prognosis and worsening status. The family made the decision to transition patient to comfort focused care.  Review of Systems  Unable to perform ROS   Objective:   Primary Diagnoses: Present on Admission:  Acute encephalopathy  Acute renal failure superimposed on stage 4 chronic kidney disease (HCC)  Essential hypertension  Hypertension  Upper GI bleed  Acute post-hemorrhagic anemia  Coronary artery disease involving native coronary artery of native heart without angina pectoris   Physical Exam Vitals reviewed.   Constitutional:      General: He is not in acute distress.    Appearance: He is ill-appearing.  HENT:     Head: Normocephalic and atraumatic.  Cardiovascular:     Rate and Rhythm: Tachycardia present.  Pulmonary:     Effort: Tachypnea present.  Skin:    General: Skin is warm and dry.     Vital Signs:  BP 98/66   Pulse (!) 124   Temp (!) 97 F (36.1 C)   Resp (!) 22   Ht 5' 7 (1.702 m)   Wt 61.7 kg   SpO2 (!) 87%   BMI 21.30 kg/m   Palliative Assessment/Data: 30%    Advanced Care Planning:   Existing Vynca/ACP Documentation: None  Primary Decision Maker: NEXT OF KIN. Patient's wife [name]  Code Status/Advance Care Planning: DNR    Assessment & Plan:   SUMMARY OF RECOMMENDATIONS   Full comfort PMT support as needed   Discussed with: bedside RN and Dr. Zaida  Time Total: 45 minutes    Thank you for allowing us  to participate in the care of [name] PMT will continue to support holistically.   Signed by: Stephane Palin, NP Palliative Medicine Team  Team Phone # [phone] (Nights/Weekends)  05-28-2024, 10:34 AM

## 2024-06-14 NOTE — Progress Notes (Signed)
 Pharmacy Antibiotic Note  [name] is a 78 y.o. male admitted on 05/13/2024 with facial swelling and possible parotitis and completed a course of antibiotics.  Now with PNA, so Pharmacy has been consulted for vancomycin  and Zosyn  dosing.  Renal function improving, WBC up to 11.9, afebrile.  Plan: Vanc 1250mg  IV x 1, then 750mg  IV Q24H for AUC 542 using SCr 1.8 Zosyn  EID 3.375gm IV Q8H Monitor renal fxn, clinical progress, vanc levels ad indicated F/u repeat MRSA PCR  Height: 5' 7 (170.2 cm) Weight: 61.7 kg (136 lb 0.4 oz) IBW/kg (Calculated) : 66.1  Temp (24hrs), Avg:97.9 F (36.6 C), Min:97.2 F (36.2 C), Max:99.1 F (37.3 C)  Recent Labs  Lab 05/17/24 2320 05/18/24 0243 05/18/24 1253 05/18/24 1902 05/19/24 0930 05/20/24 0541 05/21/24 0356 05/22/24 0640 05/22/24 1008 05/23/24 0420 06/07/2024 0348  WBC 20.0*  --  15.3* 12.8* 10.1 10.6*  --   --  11.9*  --   --   CREATININE 2.65*  --   --   --  2.81* 2.78* 2.77* 2.29*  --  2.11* 1.80*  LATICACIDVEN 2.8* 1.1  --   --   --   --   --   --   --   --   --     Estimated Creatinine Clearance: 30.5 mL/min (A) (by C-G formula based on SCr of 1.8 mg/dL (H)).    Allergies  Allergen Reactions   Baclofen Other (See Comments)    Altered mental status, extreme drowsiness   Acarbose Diarrhea    Vanc x1 on 10/3, 06/07/24 >> Zosyn  10/11 >> Unasyn 10/3>10/7 Zyvox 10/3>10/6 CRO 9/30>10/3   9/30 bldx2 - negative 9/30 urine: neg MRSA neg Resp panel neg  [name] D. [place], PharmD, BCPS, BCCCP 2024/06/07, 10:05 AM

## 2024-06-14 NOTE — Progress Notes (Signed)
 RT and RN extubated pt to comfort measures per MD order

## 2024-06-14 DEATH — deceased

## 2024-06-24 ENCOUNTER — Inpatient Hospital Stay

## 2024-06-24 ENCOUNTER — Other Ambulatory Visit (HOSPITAL_COMMUNITY)

## 2024-07-01 ENCOUNTER — Inpatient Hospital Stay: Admitting: Oncology

## 2024-11-25 ENCOUNTER — Ambulatory Visit

## 2024-11-25 ENCOUNTER — Encounter
